# Patient Record
Sex: Female | Born: 1950
Health system: Southern US, Community
[De-identification: ages and names within clinical notes are randomized; demographics above are authoritative.]

## PROBLEM LIST (undated history)

## (undated) DIAGNOSIS — IMO0001 Reserved for inherently not codable concepts without codable children: Secondary | ICD-10-CM

## (undated) DIAGNOSIS — I899 Noninfective disorder of lymphatic vessels and lymph nodes, unspecified: Secondary | ICD-10-CM

## (undated) DIAGNOSIS — I639 Cerebral infarction, unspecified: Secondary | ICD-10-CM

## (undated) DIAGNOSIS — G629 Polyneuropathy, unspecified: Secondary | ICD-10-CM

## (undated) DIAGNOSIS — R569 Unspecified convulsions: Secondary | ICD-10-CM

## (undated) DIAGNOSIS — J449 Chronic obstructive pulmonary disease, unspecified: Secondary | ICD-10-CM

## (undated) DIAGNOSIS — F329 Major depressive disorder, single episode, unspecified: Secondary | ICD-10-CM

## (undated) DIAGNOSIS — R0601 Orthopnea: Secondary | ICD-10-CM

## (undated) DIAGNOSIS — K589 Irritable bowel syndrome without diarrhea: Secondary | ICD-10-CM

## (undated) DIAGNOSIS — H9190 Unspecified hearing loss, unspecified ear: Secondary | ICD-10-CM

## (undated) DIAGNOSIS — I509 Heart failure, unspecified: Secondary | ICD-10-CM

## (undated) DIAGNOSIS — G4730 Sleep apnea, unspecified: Secondary | ICD-10-CM

## (undated) DIAGNOSIS — M1990 Unspecified osteoarthritis, unspecified site: Secondary | ICD-10-CM

## (undated) DIAGNOSIS — K219 Gastro-esophageal reflux disease without esophagitis: Secondary | ICD-10-CM

## (undated) DIAGNOSIS — F32A Depression, unspecified: Secondary | ICD-10-CM

## (undated) DIAGNOSIS — M069 Rheumatoid arthritis, unspecified: Secondary | ICD-10-CM

## (undated) DIAGNOSIS — I10 Essential (primary) hypertension: Secondary | ICD-10-CM

## (undated) DIAGNOSIS — Z9981 Dependence on supplemental oxygen: Secondary | ICD-10-CM

## (undated) DIAGNOSIS — K7460 Unspecified cirrhosis of liver: Secondary | ICD-10-CM

## (undated) DIAGNOSIS — G2581 Restless legs syndrome: Secondary | ICD-10-CM

## (undated) DIAGNOSIS — R011 Cardiac murmur, unspecified: Secondary | ICD-10-CM

## (undated) DIAGNOSIS — J45909 Unspecified asthma, uncomplicated: Secondary | ICD-10-CM

## (undated) DIAGNOSIS — Z8679 Personal history of other diseases of the circulatory system: Secondary | ICD-10-CM

## (undated) DIAGNOSIS — K759 Inflammatory liver disease, unspecified: Secondary | ICD-10-CM

## (undated) DIAGNOSIS — E119 Type 2 diabetes mellitus without complications: Secondary | ICD-10-CM

## (undated) DIAGNOSIS — Z8619 Personal history of other infectious and parasitic diseases: Secondary | ICD-10-CM

## (undated) HISTORY — DX: Heart failure, unspecified: I50.9

## (undated) HISTORY — PX: THUMB ARTHROSCOPY: SHX2509

## (undated) HISTORY — PX: TONSILLECTOMY: SUR1361

## (undated) HISTORY — PX: CHOLECYSTECTOMY: SHX55

## (undated) HISTORY — PX: TYMPANOPLASTY: SHX33

## (undated) HISTORY — PX: ABDOMINAL HYSTERECTOMY: SHX81

## (undated) HISTORY — PX: KNEE ARTHROPLASTY: SHX992

---

## 1998-06-02 ENCOUNTER — Ambulatory Visit: Admission: RE | Admit: 1998-06-02 | Discharge: 1998-06-02 | Payer: Self-pay | Admitting: Internal Medicine

## 1998-06-03 ENCOUNTER — Ambulatory Visit: Admission: RE | Admit: 1998-06-03 | Discharge: 1998-06-03 | Payer: Self-pay | Admitting: Internal Medicine

## 2000-11-27 ENCOUNTER — Emergency Department (HOSPITAL_COMMUNITY): Admission: EM | Admit: 2000-11-27 | Discharge: 2000-11-27 | Payer: Self-pay

## 2004-11-15 ENCOUNTER — Emergency Department: Payer: Self-pay | Admitting: Emergency Medicine

## 2005-04-18 ENCOUNTER — Ambulatory Visit: Payer: Self-pay

## 2006-02-14 ENCOUNTER — Emergency Department: Payer: Self-pay | Admitting: Emergency Medicine

## 2007-05-29 ENCOUNTER — Emergency Department: Payer: Self-pay | Admitting: Emergency Medicine

## 2007-05-29 ENCOUNTER — Other Ambulatory Visit: Payer: Self-pay

## 2007-09-12 ENCOUNTER — Inpatient Hospital Stay: Payer: Self-pay | Admitting: Internal Medicine

## 2008-06-01 ENCOUNTER — Observation Stay: Payer: Self-pay | Admitting: Internal Medicine

## 2008-08-10 ENCOUNTER — Emergency Department: Payer: Self-pay | Admitting: Internal Medicine

## 2009-02-20 ENCOUNTER — Emergency Department: Payer: Self-pay | Admitting: Emergency Medicine

## 2009-07-12 ENCOUNTER — Ambulatory Visit: Payer: Self-pay | Admitting: Family Medicine

## 2009-12-18 ENCOUNTER — Ambulatory Visit: Payer: Self-pay | Admitting: Family Medicine

## 2010-04-16 ENCOUNTER — Ambulatory Visit: Payer: Self-pay | Admitting: Family Medicine

## 2010-04-27 ENCOUNTER — Inpatient Hospital Stay: Payer: Self-pay | Admitting: Internal Medicine

## 2010-06-13 ENCOUNTER — Emergency Department: Payer: Self-pay | Admitting: Emergency Medicine

## 2010-06-30 ENCOUNTER — Emergency Department: Payer: Self-pay | Admitting: Emergency Medicine

## 2011-02-23 ENCOUNTER — Inpatient Hospital Stay: Payer: Self-pay | Admitting: Internal Medicine

## 2011-11-14 ENCOUNTER — Emergency Department: Payer: Self-pay | Admitting: Emergency Medicine

## 2011-11-14 LAB — URINALYSIS, COMPLETE
Blood: NEGATIVE
Glucose,UR: NEGATIVE mg/dL (ref 0–75)
Leukocyte Esterase: NEGATIVE
Nitrite: NEGATIVE
Ph: 7 (ref 4.5–8.0)
Protein: NEGATIVE
RBC,UR: NONE SEEN /HPF (ref 0–5)
Specific Gravity: 1.006 (ref 1.003–1.030)
Squamous Epithelial: 3
WBC UR: NONE SEEN /HPF (ref 0–5)

## 2011-11-14 LAB — COMPREHENSIVE METABOLIC PANEL
Alkaline Phosphatase: 128 U/L (ref 50–136)
Anion Gap: 6 — ABNORMAL LOW (ref 7–16)
BUN: 7 mg/dL (ref 7–18)
Chloride: 104 mmol/L (ref 98–107)
Co2: 30 mmol/L (ref 21–32)
EGFR (African American): >60
Potassium: 3.5 mmol/L (ref 3.5–5.1)
SGOT(AST): 47 U/L — ABNORMAL HIGH (ref 15–37)
SGPT (ALT): 30 U/L
Total Protein: 6.7 g/dL (ref 6.4–8.2)

## 2011-11-14 LAB — CBC
HGB: 14.6 g/dL (ref 12.0–16.0)
MCH: 29.8 pg (ref 26.0–34.0)
MCHC: 34.2 g/dL (ref 32.0–36.0)
MCV: 87 fL (ref 80–100)
Platelet: 223 10*3/uL (ref 150–440)
RDW: 13.4 % (ref 11.5–14.5)

## 2012-05-15 ENCOUNTER — Inpatient Hospital Stay: Payer: Self-pay | Admitting: Specialist

## 2012-05-15 LAB — CBC WITH DIFFERENTIAL/PLATELET
Basophil #: 0.1 10*3/uL (ref 0.0–0.1)
Basophil %: 0.8 %
Eosinophil #: 0 10*3/uL (ref 0.0–0.7)
Eosinophil %: 0.6 %
HCT: 45.9 % (ref 35.0–47.0)
HGB: 15.5 g/dL (ref 12.0–16.0)
Lymphocyte #: 2.5 10*3/uL (ref 1.0–3.6)
Lymphocyte %: 33.9 %
MCH: 28.7 pg (ref 26.0–34.0)
MCHC: 33.7 g/dL (ref 32.0–36.0)
MCV: 85 fL (ref 80–100)
Monocyte #: 0.5 x10 3/mm (ref 0.2–0.9)
Monocyte %: 7 %
Neutrophil #: 4.3 10*3/uL (ref 1.4–6.5)
Neutrophil %: 57.7 %
Platelet: 242 10*3/uL (ref 150–440)
RBC: 5.4 10*6/uL — ABNORMAL HIGH (ref 3.80–5.20)
RDW: 13.3 % (ref 11.5–14.5)
WBC: 7.4 10*3/uL (ref 3.6–11.0)

## 2012-05-15 LAB — COMPREHENSIVE METABOLIC PANEL
Albumin: 3.5 g/dL (ref 3.4–5.0)
Alkaline Phosphatase: 141 U/L — ABNORMAL HIGH (ref 50–136)
Anion Gap: 7 (ref 7–16)
BUN: 5 mg/dL — ABNORMAL LOW (ref 7–18)
Bilirubin,Total: 0.5 mg/dL (ref 0.2–1.0)
Calcium, Total: 8.7 mg/dL (ref 8.5–10.1)
Chloride: 103 mmol/L (ref 98–107)
Co2: 27 mmol/L (ref 21–32)
Creatinine: 0.52 mg/dL — ABNORMAL LOW (ref 0.60–1.30)
EGFR (African American): >60
EGFR (Non-African Amer.): >60
Glucose: 149 mg/dL — ABNORMAL HIGH (ref 65–99)
Osmolality: 274 (ref 275–301)
Potassium: 3.3 mmol/L — ABNORMAL LOW (ref 3.5–5.1)
SGOT(AST): 50 U/L — ABNORMAL HIGH (ref 15–37)
SGPT (ALT): 37 U/L (ref 12–78)
Sodium: 137 mmol/L (ref 136–145)
Total Protein: 7.1 g/dL (ref 6.4–8.2)

## 2012-05-15 LAB — TROPONIN I: Troponin-I: <0.02 ng/mL

## 2012-05-16 ENCOUNTER — Ambulatory Visit: Payer: Self-pay | Admitting: Neurology

## 2012-05-16 LAB — BASIC METABOLIC PANEL
Anion Gap: 8 (ref 7–16)
BUN: 6 mg/dL — ABNORMAL LOW (ref 7–18)
Calcium, Total: 8.5 mg/dL (ref 8.5–10.1)
Chloride: 103 mmol/L (ref 98–107)
Co2: 27 mmol/L (ref 21–32)
Creatinine: 0.5 mg/dL — ABNORMAL LOW (ref 0.60–1.30)
EGFR (African American): >60
EGFR (Non-African Amer.): >60
Glucose: 171 mg/dL — ABNORMAL HIGH (ref 65–99)
Osmolality: 277 (ref 275–301)
Potassium: 3.7 mmol/L (ref 3.5–5.1)
Sodium: 138 mmol/L (ref 136–145)

## 2012-05-16 LAB — CBC WITH DIFFERENTIAL/PLATELET
Basophil #: 0 10*3/uL (ref 0.0–0.1)
Basophil %: 0.5 %
Eosinophil #: 0 10*3/uL (ref 0.0–0.7)
Eosinophil %: 0.5 %
HCT: 42.8 % (ref 35.0–47.0)
HGB: 14.6 g/dL (ref 12.0–16.0)
Lymphocyte #: 2 10*3/uL (ref 1.0–3.6)
Lymphocyte %: 24.4 %
MCH: 28.9 pg (ref 26.0–34.0)
MCHC: 34.1 g/dL (ref 32.0–36.0)
MCV: 85 fL (ref 80–100)
Monocyte #: 0.6 x10 3/mm (ref 0.2–0.9)
Monocyte %: 7.3 %
Neutrophil #: 5.6 10*3/uL (ref 1.4–6.5)
Neutrophil %: 67.3 %
Platelet: 204 10*3/uL (ref 150–440)
RBC: 5.04 10*6/uL (ref 3.80–5.20)
RDW: 13.4 % (ref 11.5–14.5)
WBC: 8.3 10*3/uL (ref 3.6–11.0)

## 2012-05-16 LAB — PHENYTOIN LEVEL, TOTAL: Dilantin: 12.6 ug/mL (ref 10.0–20.0)

## 2012-05-16 LAB — TROPONIN I
Troponin-I: <0.02 ng/mL
Troponin-I: <0.02 ng/mL

## 2012-05-16 LAB — CLOSTRIDIUM DIFFICILE BY PCR

## 2012-05-17 LAB — WBCS, STOOL

## 2012-05-18 LAB — STOOL CULTURE

## 2012-10-13 ENCOUNTER — Ambulatory Visit: Payer: Self-pay | Admitting: Family Medicine

## 2012-10-28 ENCOUNTER — Emergency Department: Payer: Self-pay | Admitting: Emergency Medicine

## 2012-10-28 LAB — COMPREHENSIVE METABOLIC PANEL
Albumin: 3.3 g/dL — ABNORMAL LOW (ref 3.4–5.0)
Alkaline Phosphatase: 150 U/L — ABNORMAL HIGH (ref 50–136)
BUN: 8 mg/dL (ref 7–18)
Bilirubin,Total: 0.4 mg/dL (ref 0.2–1.0)
Chloride: 98 mmol/L (ref 98–107)
EGFR (African American): >60
EGFR (Non-African Amer.): >60
Glucose: 117 mg/dL — ABNORMAL HIGH (ref 65–99)
Osmolality: 266 (ref 275–301)
Potassium: 3.2 mmol/L — ABNORMAL LOW (ref 3.5–5.1)
SGOT(AST): 47 U/L — ABNORMAL HIGH (ref 15–37)
Sodium: 133 mmol/L — ABNORMAL LOW (ref 136–145)

## 2012-10-28 LAB — CBC WITH DIFFERENTIAL/PLATELET
Basophil %: 1.4 %
Eosinophil #: 0.1 10*3/uL (ref 0.0–0.7)
Eosinophil %: 1 %
HCT: 44.8 % (ref 35.0–47.0)
Lymphocyte #: 2.2 10*3/uL (ref 1.0–3.6)
Lymphocyte %: 36.4 %
MCH: 29.6 pg (ref 26.0–34.0)
MCHC: 34.6 g/dL (ref 32.0–36.0)
Monocyte %: 8.4 %
Neutrophil %: 52.8 %
Platelet: 239 10*3/uL (ref 150–440)
RBC: 5.24 10*6/uL — ABNORMAL HIGH (ref 3.80–5.20)
RDW: 13.2 % (ref 11.5–14.5)
WBC: 6 10*3/uL (ref 3.6–11.0)

## 2013-04-18 ENCOUNTER — Emergency Department: Payer: Self-pay | Admitting: Emergency Medicine

## 2013-04-23 ENCOUNTER — Inpatient Hospital Stay: Payer: Self-pay | Admitting: Internal Medicine

## 2013-04-23 LAB — COMPREHENSIVE METABOLIC PANEL
Albumin: 3.2 g/dL — ABNORMAL LOW (ref 3.4–5.0)
Alkaline Phosphatase: 166 U/L — ABNORMAL HIGH (ref 50–136)
Anion Gap: 7 (ref 7–16)
BUN: 9 mg/dL (ref 7–18)
Bilirubin,Total: 0.4 mg/dL (ref 0.2–1.0)
Calcium, Total: 9.1 mg/dL (ref 8.5–10.1)
EGFR (African American): >60
EGFR (Non-African Amer.): >60
Glucose: 119 mg/dL — ABNORMAL HIGH (ref 65–99)
Osmolality: 270 (ref 275–301)
SGOT(AST): 35 U/L (ref 15–37)
Sodium: 135 mmol/L — ABNORMAL LOW (ref 136–145)
Total Protein: 7 g/dL (ref 6.4–8.2)

## 2013-04-23 LAB — CBC
HCT: 46.2 % (ref 35.0–47.0)
HGB: 16.1 g/dL — ABNORMAL HIGH (ref 12.0–16.0)
MCH: 29.6 pg (ref 26.0–34.0)
MCV: 85 fL (ref 80–100)
Platelet: 259 10*3/uL (ref 150–440)

## 2013-04-23 LAB — TROPONIN I
Troponin-I: <0.02 ng/mL
Troponin-I: <0.02 ng/mL

## 2013-04-23 LAB — CK TOTAL AND CKMB (NOT AT ARMC)
CK, Total: 39 U/L (ref 21–215)
CK, Total: 31 U/L (ref 21–215)
CK-MB: 0.9 ng/mL (ref 0.5–3.6)

## 2013-04-24 ENCOUNTER — Ambulatory Visit: Payer: Self-pay | Admitting: Neurology

## 2013-04-24 LAB — CBC WITH DIFFERENTIAL/PLATELET
Basophil %: 0.1 %
Eosinophil #: 0 10*3/uL (ref 0.0–0.7)
Eosinophil %: 0 %
HCT: 44.6 % (ref 35.0–47.0)
HGB: 15.3 g/dL (ref 12.0–16.0)
Lymphocyte %: 15.8 %
MCH: 29.6 pg (ref 26.0–34.0)
Neutrophil #: 7 10*3/uL — ABNORMAL HIGH (ref 1.4–6.5)
Platelet: 253 10*3/uL (ref 150–440)
RBC: 5.17 10*6/uL (ref 3.80–5.20)
RDW: 13 % (ref 11.5–14.5)
WBC: 8.6 10*3/uL (ref 3.6–11.0)

## 2013-04-24 LAB — COMPREHENSIVE METABOLIC PANEL
Albumin: 2.8 g/dL — ABNORMAL LOW (ref 3.4–5.0)
Anion Gap: 7 (ref 7–16)
Bilirubin,Total: 0.4 mg/dL (ref 0.2–1.0)
Chloride: 102 mmol/L (ref 98–107)
Co2: 26 mmol/L (ref 21–32)
EGFR (Non-African Amer.): >60
Total Protein: 6.2 g/dL — ABNORMAL LOW (ref 6.4–8.2)

## 2013-04-24 LAB — CK TOTAL AND CKMB (NOT AT ARMC): CK, Total: 24 U/L (ref 21–215)

## 2013-04-24 LAB — TROPONIN I: Troponin-I: <0.02 ng/mL

## 2013-04-24 LAB — CLOSTRIDIUM DIFFICILE BY PCR

## 2013-04-28 LAB — CULTURE, BLOOD (SINGLE)

## 2013-11-05 ENCOUNTER — Emergency Department: Payer: Self-pay | Admitting: Emergency Medicine

## 2013-11-05 LAB — COMPREHENSIVE METABOLIC PANEL
ALBUMIN: 3.4 g/dL (ref 3.4–5.0)
ALK PHOS: 145 U/L — AB
ALT: 35 U/L (ref 12–78)
Anion Gap: 7 (ref 7–16)
BILIRUBIN TOTAL: 0.3 mg/dL (ref 0.2–1.0)
BUN: 9 mg/dL (ref 7–18)
CALCIUM: 8.9 mg/dL (ref 8.5–10.1)
CHLORIDE: 103 mmol/L (ref 98–107)
CREATININE: 0.35 mg/dL — AB (ref 0.60–1.30)
Co2: 29 mmol/L (ref 21–32)
EGFR (African American): >60
Glucose: 219 mg/dL — ABNORMAL HIGH (ref 65–99)
Osmolality: 283 (ref 275–301)
POTASSIUM: 3.3 mmol/L — AB (ref 3.5–5.1)
SGOT(AST): 48 U/L — ABNORMAL HIGH (ref 15–37)
SODIUM: 139 mmol/L (ref 136–145)
Total Protein: 7 g/dL (ref 6.4–8.2)

## 2013-11-05 LAB — CBC
HCT: 44.8 % (ref 35.0–47.0)
HGB: 15.3 g/dL (ref 12.0–16.0)
MCH: 29.2 pg (ref 26.0–34.0)
MCHC: 34.2 g/dL (ref 32.0–36.0)
MCV: 85 fL (ref 80–100)
PLATELETS: 214 10*3/uL (ref 150–440)
RBC: 5.25 10*6/uL — ABNORMAL HIGH (ref 3.80–5.20)
RDW: 13.3 % (ref 11.5–14.5)
WBC: 6.5 10*3/uL (ref 3.6–11.0)

## 2013-11-05 LAB — TROPONIN I: Troponin-I: <0.02 ng/mL

## 2013-11-05 LAB — CK TOTAL AND CKMB (NOT AT ARMC)
CK, Total: 47 U/L
CK-MB: <0.5 ng/mL — ABNORMAL LOW (ref 0.5–3.6)

## 2013-11-05 LAB — D-DIMER(ARMC): D-Dimer: 589 ng/ml

## 2014-10-24 NOTE — Consult Note (Signed)
PATIENT NAME:  [name], [name] MR#:  [id number] DATE OF BIRTH:  Dec 23, 1950  DATE OF CONSULTATION:  05/16/2012  REFERRING PHYSICIAN:   CONSULTING PHYSICIAN:  Leotis Pain, MD  Information is obtained from the patient's family who is at bedside, the chart and patient herself with the assistance of a translator because the patient uses sign language.  HISTORY OF PRESENTING ILLNESS: This is a 64 year old female with past medical history of seizure disorder, has been on the same antiepileptics of Keppra 500 mg twice a day for a long period of time and has not followed up with Neurology for a prolonged period of time and she does not remember the last time she has seen one. The patient was walking around and was witnessed bystander that fell and description of generalized tonic-clonic seizure activity even though the duration was not known. It was suspected of her being hypoglycemic at that time but, as per family, they think her fingerstick was initially 137. When EMS arrived it was 153. En route to the hospital, she had another seizure activity and upon arrival to the Emergency Department she had three more convulsive seizures. At that time she was given Keppra load and increased Keppra to 1000 mg b.i.d. She is status post Dilantin load with 1000 mg and started on Dilantin 100 mg q.8. She also received a dose of Ativan. As per family at bedside and the patient herself, she is close her baseline, just feels a little bit tired. She tells me that she does not recall her prior seizure. With the current seizure she does not know whether there was any urinary incontinence, any tongue biting but there was apparent postictal state as per notes.  PAST MEDICAL HISTORY:  1. Chronic obstructive pulmonary disease. 2. Sleep apnea. She is on oxygen night.   3. Smoker.  4. Hypertension. 5. Hyperlipidemia. 6. Diabetes. 7. Diabetic neuropathy for which she is on Lyrica.  8. Deaf in both ears.   PAST SURGICAL  HISTORY:  1. Cholecystectomy. 2. Cesarean section. 3. Tonsillectomy.  ALLERGIES: Aspirin, Celebrex, Cipro, codeine, Levaquin, penicillin, and sulfa.   FAMILY HISTORY: Consistent with her mother who passed from complication of lung cancer.   SOCIAL HISTORY: She is a smoker of 1 pack per day. No alcohol use. No drug use. She gets assistance from home health nursing.   HOME MEDICATIONS:  1. Advair. 2. Albuterol. 3. Amlodipine. 4. Azithromycin. 5. Celexa. 6. Enalapril. 7. Iron supplementation. 8. Glipizide. 9. Hydrochlorothiazide.  10. Imdur. 11. Keppra 500 mg twice a day which was increased to 1000 mg twice a day here in the hospital.  12. Lyrica 150 mg twice a day for her neuropathy. 13. Omeprazole. 14. Plavix. 15. Spiriva. 16. Tylenol. 17. Ventolin.   PHYSICAL EXAMINATION:   VITAL SIGNS: Temperature 97.4, pulse 58, blood pressure 121/69, respirations 18, pulse oximetry 91% on 2 liters of O2.   PHYSICAL EXAMINATION: The patient is alert, awake, and oriented to time, place, location, date, time. She knows she is in the hospital. She knows which hospital. She is able to tell me with assistance of translation of the month and the date. Visual field testing is intact. Extraocular movements are intact. No facial asymmetry noted. Facial sensation intact bilaterally. Tongue is midline. She is able to shrug her shoulders bilateral without a problem. Motor examination no sign of spasticity or rigidity. Tone appears to be normal symmetrical. Upper extremity examination 4+/5. Bilateral lower extremities it is difficult to examine because she has fallen and has  stitches in the right knee and has severe pain in the left hip area. Dorsiflexion and plantar flexion is intact. Reflexes are 1+ throughout. Finger-to-nose coordination is intact. Ambulation could not be assessed.   LABORATORY, DIAGNOSTIC, AND RADIOLOGICAL DATA: Her lab work was evaluated with white blood cell count of 8.3, hemoglobin  14.6, hematocrit 42.8, platelets 204, sodium 138, potassium 3.7, BUN 6, creatinine 0.5, glucose 171. Liver function tests were also reviewed as patient was just started on Dilantin. ALT 37. There is slight increase in AST of 50. Dilantin level was reviewed and was 12.6 this morning.   IMPRESSION: This is a 64 year old female with multiple medical problems admitted with multiple convulsive seizures that resolved post Ativan, Keppra load, and Dilantin load. Current antiepileptic medication includes Keppra 1000 mg twice a day which was increased from her home medication 500 twice a day. She is started on Dilantin with a current dose of 100 q.8 and Dilantin level as above. She also takes Lyrica 150 mg twice a day, which is her home medication, which is for neuropathy.  PLAN:  1. Would obtain MRI of the brain as there is a possibility of mild dysmetria of finger-to-nose on the right that is very mild but at the same time in a patient that has 4 to 5 seizures that were suspected to be unprovoked seizures would want to make sure that we do not have any acute intracranial pathology that is contributing to this condition. 2. EEG as ordered per primary team.  3. Would obtain Dilantin level prior to discharge. 4. I strongly advised her to follow-up with a neurologist as outpatient since she has not seen one in a long time.  5. Physical therapy and occupational therapy.  It was a pleasure seeing this patient.   ____________________________ Leotis Pain, MD yz:drc D: 05/16/2012 12:25:35 ET T: 05/16/2012 12:42:19 ET JOB#: [id number]  cc: Leotis Pain, MD, <Dictator> Leotis Pain MD ELECTRONICALLY SIGNED 06/22/2012 19:13

## 2014-10-24 NOTE — H&P (Signed)
PATIENT NAME:  [name], [name] MR#:  [id number] DATE OF BIRTH:  1950/12/18  DATE OF ADMISSION:  05/15/2012  PRIMARY CARE PHYSICIAN: [facility], Salome Holmes, MD     CHIEF COMPLAINT: Brought in with seizure.  HISTORY OF PRESENT ILLNESS: This is a 64 year old female with a history of seizure disorder. As per the family, the sugar went down and they tried to give her some honey. She had a seizure.  EMS was called, and EMS fingerstick was 153. The patient had another seizure with EMS and another three seizures here in the Emergency Room with Dr. Cinda Quest, the ER physician, who gave IV Keppra, IV Cerebyx and two doses of Ativan. I tried getting a sign language interpreter, Jae Dire,  to translate; but the patient was in and out with sleepiness, was answering some questions appropriately, and then some questions not so appropriately and easily fell back asleep after each conversation, so not much history was obtained from the patient. History was obtained from the family and old chart.   PAST MEDICAL HISTORY:  1. Chronic obstructive pulmonary disease. 2. Sleep apnea on oxygen at night.  3. Ongoing tobacco abuse.  4. Hypertension.  5. Hyperlipidemia.  6. Diabetes.  7. Diabetic neuropathy.  8. Deaf in both ears.   PAST SURGICAL HISTORY:  1. Cholecystectomy.  2. C-section.  3. Numerous ear surgeries.  4. Tonsillectomy.   ALLERGIES: Aspirin, Celebrex, Cipro, codeine, Levaquin, penicillin, and sulfa.   FAMILY HISTORY: Mother died of complications of lung cancer. Father died of a cerebrovascular accident aneurysm of the brain.   SOCIAL HISTORY: She smokes 1 pack per day. No alcohol. No drug use. She does have a Home Health nurse that comes in and worked in the [employer] in the past. She lives with her husband.   CURRENT MEDICATIONS:  1. Advair Diskus 100/50, 2 puffs twice a day. 2. Albuterol nebulizer 3 mL every 6 hours as needed.  3. Amlodipine 10 mg daily.   4. Azithromycin 250 mg daily.  5. Celexa 20 mg daily.  6. Enalapril 20 mg at bedtime.  7. Ferrous sulfate 325 mg twice a day.  8. Glipizide 5 mg daily.  9. Hydrochlorothiazide 25 mg daily.  10. Imodium p.r.n.  11. Keppra 500 mg twice a day.  12. Lyrica 150 mg twice a day.  13. Omeprazole 20 mg daily.  14. Plavix 75 mg daily.  15. Spiriva 1 inhalation daily.  16. Tylenol p.r.n.  17. Ventolin HFA p.r.n.   REVIEW OF SYSTEMS: Difficult  to obtain secondary to the patient being in and out. CONSTITUTIONAL: No fever. EYES: No eye issues. EARS: Positive for being deaf bilaterally. CARDIOVASCULAR: Positive for chest hurts. RESPIRATORY: Positive for shortness breath. Positive for cough. GASTROINTESTINAL: Positive for nausea. Positive for vomiting. Positive for abdominal pain. Positive for diarrhea. Occasional blood. GENITOURINARY: No burning on urination. No hematuria. MUSCULOSKELETAL: Positive for leg pain, and that is all I got because she went out and sleeping after that. Unable to finish a complete review of systems secondary to altered mental status being ictal.   PHYSICAL EXAMINATION:  VITAL SIGNS: Pulse 59, respirations 22, blood pressure 133/62, and pulse oximetry 95% on 2 liters.   GENERAL: No respiratory distress.   HEENT: Eyes: Conjunctivae normal. Lids normal. Pupils equal, round, and reactive to light. Unable to test extraocular muscles. Ears, nose, mouth, and throat: Tympanic membranes no erythema. Nasal mucosa no erythema. Throat difficult to examine secondary to large tongue and patient's level  of consciousness. Lips and gums no lesions.   NECK: No JVD. No bruits. No lymphadenopathy. No thyromegaly. No thyroid nodules palpated.   LUNGS: Lungs are clear to auscultation. No use of accessory muscles to breathe. No rhonchi, rales, or wheeze heard.   CARDIOVASCULAR: S1, S2 normal. No gallops, rubs, or murmurs heard. Carotid upstroke 2+ bilaterally. No bruits.   EXTREMITIES: Dorsalis  pedis pulses 2+ bilaterally. No edema of the lower extremity.   ABDOMEN: Soft, nontender. No organomegaly/splenomegaly. Normoactive bowel sounds. No masses felt.   LYMPHATIC: No lymph nodes in the neck.   MUSCULOSKELETAL: No clubbing, edema, or cyanosis.   SKIN: No ulcers or lesions seen anteriorly.   NEUROLOGICAL: The patient did do some sign language moving both hands with the sign interpreter but then fell back asleep before my physical exam. Unable to test cranial nerves. Babinski negative bilaterally. Deep tendon reflexes 1/2+ plus bilaterally.   PSYCHIATRIC: Did answer some questions with the interpreter through sign language, sometimes answering appropriately, other times not making much sense, then fell asleep prior to me checking full orientation.   LABORATORY, DIAGNOSTIC AND RADIOLOGICAL DATA: Pelvis x-ray negative. Right femur negative. Left femur negative. CT scan of the cervical spine: No acute osseous injury. CT scan of the head: No acute intracranial process. White blood cell count 7.4, hemoglobin and hematocrit 15.5 and 45.9, platelet count 242.0. Glucose 149, BUN 5, creatinine 0.52, sodium 137, potassium 3.3, chloride 103, CO2 27, calcium 8.7. Liver function tests: AST slightly elevated at 50. EKG normal sinus rhythm, 71 beats per minute, QT 424, QTc 460.   ASSESSMENT AND PLAN:  1. Status epilepticus with a total of five seizures, now seems more postictal: The patient was given Ativan,  Cerebyx and Keppra. I will admit to the hospital, get an MRI of the brain. I will continue IV Cerebyx for right now, increase the dose of Keppra to 1000 mg b.i.d. We will get a Neurological evaluation in the a.m. Unfortunately, on the weekend unable to get an EEG.  2. Hypoglycemia, possibly as the precipitating event of this seizure:  I will hold the Glucotrol at this point. Check sugars every 2 hours, put on D5 NS at 40 mL/h.  3. Chronic obstructive pulmonary disease, sleep apnea, chronic  respiratory failure: Wears oxygen at night. The major issue is tobacco abuse. I will give a nicotine patch. Continue inhalers. Lungs are currently clear.  4. Hypertension: We will continue her usual medications.  5. Hypokalemia:  I will replace potassium in the IV and check a magnesium.  6. Depression: Continue Celexa.  7. Looking back through old records, it looks like she is chronically on this Zithromax, probably from the respiratory standpoint.  8. Diarrhea: I will send off stool studies.   CODE STATUS: The patient is a PARTIAL CODE, no intubation or mechanical ventilation, otherwise try cardiopulmonary resuscitation.   TIME SPENT ON ADMISSION: 60 minutes.  ____________________________ Tana Conch. Leslye Peer, MD [id number] D: 05/15/2012 16:27:43 ET T: 05/16/2012 07:09:11 ET JOB#: [id number]  cc: Tana Conch. Leslye Peer, MD, <Dictator> [facility], Salome Holmes, MD   Marisue Brooklyn MD ELECTRONICALLY SIGNED 06/07/2012 17:26

## 2014-10-24 NOTE — Discharge Summary (Signed)
PATIENT NAME:  [name], FRYMIRE MR#:  [id number] DATE OF BIRTH:  Mar 14, 1951  DATE OF ADMISSION:  05/15/2012 DATE OF DISCHARGE:  05/18/2012  For a detailed note, please take a look at the history and physical done on admission by Dr. Loletha Grayer.   DIAGNOSES AT DISCHARGE:  1. Recurrent seizures, now resolved.  2. Diabetes.  3. Diabetic neuropathy.  4. Hypertension.  5. Gastroesophageal reflux disease.  6. Chronic obstructive pulmonary disease.   DIET: The patient is being discharged on a low sodium, low cholesterol, carb-controlled diet.   ACTIVITY: As tolerated. The patient is not to drive or operate any heavy machinery for six months.   FOLLOW-UP: Follow-up with Dr. Iona Beard in the next 1 to 2 weeks.   DISCHARGE MEDICATIONS:   1. Advair 100/50 two puffs b.i.d.  2. Amlodipine 10 mg daily.  3. Celexa 20 mg daily.  4. Enalapril 20 mg daily.  5. Iron sulfate 325 mg b.i.d.  6. Glipizide 5 mg daily.  7. HCTZ 25 mg daily.  8. Plavix 75 mg daily.   9. Spiriva 1 puff daily.  10. Omeprazole 20 mg daily.  11. Imodium as needed.  12. Lyrica 150 mg b.i.d.  13. Albuterol nebulizer q.6 hours as needed.  14. Zithromax 250 mg daily.  15. Tylenol 325 mg q.6 hours as needed.  16. Keppra 1000 mg b.i.d.   CONSULTANTS DURING THE HOSPITAL COURSE: Dr. Valora Corporal and Dr. Irish Elders from Neurology    [place]:  1. CT scan of the head done on admission without contrast showing no acute intracranial process.  2. CT of the cervical spine also done without contrast showing no osseous injury of the cervical spine.  3. MRI of the brain done with and without contrast showing no acute abnormality.  4. X-ray of the left ankle showing no acute osseous abnormality.   HOSPITAL COURSE: This is a 64 year old female with medical problems as mentioned above who presented to the hospital with recurrent seizures and status epilepticus.  1. Recurrent seizures. The  patient has a history of epilepsy, was already on Keppra prior to coming in but presented with 4 to 5 seizures within a matter of a few hours. She was significantly postictal when she presented to the hospital although was able to maintain her airway. She was loaded with IV Dilantin, started on IV fosphenytoin, and her Keppra dose was increased from 500 b.i.d. to 1000 b.i.d. She underwent significant testing including a CT of her head and MRI of her brain which were negative. She was also seen by Neurology with Dr. Valora Corporal, also underwent an EEG the results of which are still pending but, as per Neurology, she has had no further seizures during the hospital course. Since her imaging studies are negative, she is currently being discharged on a higher dose of Keppra than what she was on. She was on fosphenytoin as mentioned but she has not tolerated Dilantin well, therefore, that was discontinued. She will follow-up with her neurologist as an outpatient in the next 3 to 6 months.  2. Diabetes. The patient had no evidence of hypoglycemia or hyperglycemia. She will resume her glipizide upon discharge.  3. Diabetic neuropathy. The patient was maintained on her Lyrica. She will resume that upon discharge.  4. COPD. She had no acute exacerbation. She will continue her Advair, Spiriva, and p.r.n. nebulizer treatments.  5. Hypertension. The patient remained hemodynamically stable. She will continue her Norvasc, enalapril, and HCTZ  upon discharge.  6. Right knee laceration. She developed this after a fall after having recurrent seizures. It was sutured in the ER. She currently has a dressing on it. She will continue her dressing changes at home. Likely should have her sutures removed in the next week or so after she follows up with her regular doctor. She will continue to apply Neosporin locally twice daily.   The patient was evaluated by physical therapy who thought she would benefit from home health physical  therapy which was arranged for her prior to discharge.   CODE STATUS: The patient is a LIMITED CODE.   TIME SPENT WITH THE DISCHARGE: 40 minutes.   ____________________________ Belia Heman. Verdell Carmine, MD vjs:drc D: 05/18/2012 16:17:55 ET T: 05/19/2012 11:28:13 ET JOB#: [id number]  cc: Belia Heman. Verdell Carmine, MD, <Dictator> Salome Holmes, MD Henreitta Leber MD ELECTRONICALLY SIGNED 05/20/2012 8:11

## 2014-10-27 NOTE — Discharge Summary (Signed)
PATIENT NAME:  [name], [name] MR#:  [id number] DATE OF BIRTH:  01-06-1951  DATE OF ADMISSION:  04/23/2013 DATE OF DISCHARGE:  04/26/2013   DISCHARGE DIAGNOSES: 1.  Pneumonia, respiratory failure, improved. 2.  Seizure disorder.  3.  Electrolyte imbalance.  4.  Deafness.   CONDITION ON DISCHARGE: Stable.   CODE STATUS: Full code.   MEDICATIONS ON DISCHARGE: 1.  Amlodipine 10 mg once a day.  2.  Citalopram 20 mg once a day.  3.  Ferrous sulfate 325 mg 2 times a day. 4.  Omeprazole 20 mg once a day.  5.  Lyrica 150 mg 2 times a day.  6.  Albuterol inhaler every 6 hours as needed for shortness of breath. 7.  Advair 1 puff 2 times a day.  8.  Enalapril 20 mg once a day.  9.  Glipizide XL 5 mg 2 times a day.  10.   Hydrochlorothiazide 25 mg to take 1/2 tablet once a day.  11.   Clopidogrel 75 mg once a day.  12.   Prednisone 10 mg, start at 60 mg and taper by 10 mg until complete.  13.   Keppra 1000 mg oral tablet every 12 hours.  14.   Ceftin 500 mg oral 2 times a day for 3 days.  15.  Erythromycin 250 mg tablet once a day for 3 days   HOME HEALTH ON DISCHARGE: Yes.   HOME HEALTH SERVICES: Nurse aide.   DIET ON DISCHARGE: Low-sodium.   FOLLOWUP:  Timeframe to follow-up in 1 to 2 weeks required with PMD.   HISTORY OF PRESENT ILLNESS:   Admitted October 18 by Dr. Felipa Furnace;  a 64 year old female with known seizure disorder, a history of a previous stroke, rheumatic fever, pneumonia, sleep apnea and COPD, presented to the Emergency Room with left-sided chest pain radiating to the left arm associated with shortness of breath. In the Emergency Room, the patient's cardiac enzymes were negative, but CT of the chest revealed left lower lobe pneumonia.   HOSPITAL COURSE AND STAY:  1.  This patient came with chest pain and shortness of breath and found having pneumonia. The patient also had some seizure episodes in the hospital. We called neurology consult and seizure episodes  thought due to infection and advised to continue [place]. The patient remained afebrile and no seizures for next 48 hours.  2.  Left lower lobe pneumonia. She was on Rocephin and Zithromax and because of that, patient had acute on chronic respiratory failure, using oxygen. The patient also had chest pain on presentation, which was noncardiac. Troponins were negative and we attributed the pain to her pneumonia.  3.  Chronic obstructive pulmonary disease, acute respiratory failure. She was on IV steroids and inhaled nebulizer treatment and will continue on discharge.  4.  Electrolyte imbalance, which was replaced and corrected.  5.  Bilateral hearing loss; this is chronic complaint.   CONSULTATION IN THE HOSPITAL:  Dr. Valora Corporal from neurology.   IMPORTANT LABORATORIES IN THE HOSPITAL:  Chest x-ray, PA and lateral shows no acute cardiopulmonary disease per admission.  no intracranial abnormality. CT angiography of chest for pulmonary embolism: Left lower lobe pneumonia versus postobstructive atelectasis. Correlation with clinical lab data. Blood cultures: No growth. Keppra level was reported after patient was discharged and which was 26.7, totally normal.    Total time spent on this discharge:  35 minutes    ____________________________ Ceasar Lund. Anselm Jungling, MD vgv:[id number] D: 04/30/2013 23:36:00 ET T: 05/01/2013 01:37:27  ET JOB#: [id number]  cc: [facility] Ceasar Lund. Anselm Jungling, MD, <Dictator>    Vaughan Basta MD ELECTRONICALLY SIGNED 05/04/2013 13:59

## 2014-10-27 NOTE — Consult Note (Signed)
PATIENT NAME:  [name], SOCKWELL MR#:  [id number] DATE OF BIRTH:  May 24, 1951  DATE OF CONSULTATION:  04/24/2013  CONSULTING PHYSICIAN:  Rogue Jury, MD  ADDENDUM  I just spoke to the nurse, who saw 2 events early this morning, which were typical of what had happened in the Emergency Room. She says that the patient's eyes were closed and her head was going back and forth in a no-no fashion. Her feet were having some flexion-extension at the ankles bilaterally, with no movements of the arms and no stiffening. About 30 to 45 seconds later, she opened her eyes. She was fully with it and awake and alert and non-postictal. She wanted to have coffee and breakfast immediately after. There was no tongue biting, no urinary incontinence and no other focal changes. These events are suggestive of nonepileptic psychogenic events and not true epileptic events. She may have had real seizures in the distant past, but these particular events are not likely to be real seizures.    ____________________________ Rogue Jury, MD se:jm D: 04/24/2013 12:33:54 ET T: 04/24/2013 15:39:35 ET JOB#: [id number]  cc: Rogue Jury, MD, <Dictator> Rogue Jury MD ELECTRONICALLY SIGNED 05/25/2013 9:50

## 2014-10-27 NOTE — Consult Note (Signed)
PATIENT NAME:  [name], [name] MR#:  [id number] DATE OF BIRTH:  1951/01/18  DATE OF CONSULTATION:  04/24/2013  REFERRING PHYSICIAN:  Internal Medicine Hospitalist CONSULTING PHYSICIAN:  Rogue Jury, MD  REASON FOR CONSULTATION:  Seizure.  HISTORY OF PRESENT ILLNESS:  The patient is a 64 year old white female who was admitted on April 23, 2013, with shortness of breath and left-sided chest discomfort. CTA of the chest showed left lower lobe pneumonia. During this evaluation in the Emergency Room, the patient had 7 events thought to be seizures. They are described by the daughter as her head moving back and forth left to right, with her eyes closed but having nystagmus underneath it. She was nonresponsive and then her legs had stiffening and clonic movements. Each one lasted about 30 seconds to a minute and there was a total of 7 of them. She was loaded on Keppra and put on higher dose of Keppra maintenance therapy as compared to what she was taking at home. She denies any noncompliance with Keppra at home. Her seizure history dates back 30 years without a clear cause. She initially was on Dilantin and eventually switched to [place] as an outpatient. She does have bilateral hearing loss due to repeated ear infections as a child in the [facility]. Family history is not known, although there may have been a first cousin with seizures. The patient says that she has an aura of a sweet smell before the seizures start, and sometimes she gets the sweet smell without an actual seizure following. Her daughter has witnessed staring and nonresponsive episodes after these sweet smelling events as well. CT scan of the brain was obtained, which I reviewed. It indicates no acute hemorrhages. There is no evidence of an old infarct present. Chest x-ray showed the left lower lobe pneumonia, as well as CTA of the chest. CBC is normal. Blood cultures are negative. Cardiac enzymes are negative. Liver enzymes are normal except  for alkaline phosphatase mildly elevated at 149. Albumin is 2.8. Basic metabolic panel is normal except for a glucose of 163.  PAST MEDICAL HISTORY:   1.  Seizures. 2.  Hypotension. 3.  Depression. 4.  Anemia.  5.  Diabetes. 6.  COPD. 7.  Rheumatic fever. 8.  Bilateral hearing loss due to repeated ear infections as a child.  PAST SURGICAL HISTORY:  Negative.  CURRENT MEDICATIONS IN THE HOSPITAL: 1.  Norvasc 10 mg daily. 2.  Celexa 20 mg daily. 3.  Enalapril 20 mg daily. 4.  Iron sulfate 325 mg daily. 5.  Hydrochlorothiazide 12.5 mg daily. 6.  Keppra 1000 mg b.i.d.  7.  Lyrica 150 mg b.i.d.  8.  Plavix 75 mg daily. 9.  Lovenox 40 mg subcutaneously daily. 10.  Glipizide 5 mg b.i.d.  11.  Insulin sliding scale. 12.  Azithromycin 500 mg IV daily. 13.  Ceftriaxone 1 gram IV daily. 14.  Solu-Medrol 60 mg IV q.12 hours.  ALLERGIES:  1.  SULFA. 2.  ASPIRIN. 3.  PENICILLIN. 4.  DILANTIN. 5.  LEVAQUIN. 6.  CELEBREX. 7.  CIPRO. 8.  CODEINE.  SOCIAL HISTORY:  One pack per day smoker. No alcohol or illicit drug use.  FAMILY HISTORY:  Positive for diabetes, stroke and coronary artery disease.  REVIEW OF SYSTEMS:  Reveals no fever, no meningismus, no diplopia, no dysphagia. No chest pain or shortness of breath at this point, although she did have some on admission. All other review of systems are negative.  PHYSICAL EXAMINATION:  VITAL SIGNS:  Blood  pressure is 132/54, pulse of 59, temperature 97.9. HEART:  Regular rate and rhythm, S1, S2. No murmurs. LUNGS: Clear to auscultation.  NECK:  There are no carotid bruits. NEUROLOGIC:  She is awake and alert. Hearing impaired. Uses sign language to speak and does read lips. Pupils are equal and reactive. Extraocular movements are intact. Face is symmetrical. Tongue is midline. There are no lacerations on the tongue. Strength is 5 out of 5 bilaterally in upper and lower extremities in all muscle groups. Reflexes are +2 and symmetrical.  Sensation is intact to all modalities. Coordination is fully intact. There is no Babinski sign. There is no Hoffmann sign. There are no skin rashes. Gait is normal.  IMPRESSION AND PLAN:  This is a patient with a long history of seizure disorder, likely to be genetic in origin. She has had a recent recurrence in the hospital, possibly due to seizure threshold lowering effects of an infection. Some of the features of the seizures based on the daughter's description sound psychogenic, nonepileptic in nature, however. Nevertheless, I think it is reasonable to have increased her Keppra maintenance dose. I do recommend doing an EEG to look for signs of seizure tendency and to ensure that her electrical activity is normal. If there is any focality to the EEG, then I recommend doing an MRI of the brain. Otherwise, I do not think it would be necessary since her neurological exam is very good.    ____________________________ Rogue Jury, MD se:jm D: 04/24/2013 12:23:56 ET T: 04/24/2013 15:14:06 ET JOB#: [id number]  cc: Rogue Jury, MD, <Dictator> Rogue Jury MD ELECTRONICALLY SIGNED 05/25/2013 9:50

## 2014-10-27 NOTE — H&P (Signed)
PATIENT NAME:  [name], [name] MR#:  [id number] DATE OF BIRTH:  03-30-1951  DATE OF ADMISSION:  04/23/2013  REFERRING PHYSICIAN: Dr. Cinda Quest  FAMILY PHYSICIAN: Dr. Salome Holmes    REASON FOR ADMISSION: Pleuritic chest pain with recurrent seizures.   HISTORY OF PRESENT ILLNESS: The patient is a 64 year old female with known seizure disorder. Has a history of a previous stroke, rheumatic fever, pneumonia, sleep apnea, and COPD /asthma. Presented to the Emergency Room with left-sided chest pain radiating to the left arm associated with shortness of breath. In the Emergency Room, the patient's cardiac enzymes were negative, but CT of the chest revealed a left lower lobe pneumonia. While in the CT scanner, the patient had a recurrent seizure. Had another one in the Emergency Room. She is now admitted for further evaluation. The patient is deaf. She is currently sedated from the Ativan and is unable to give a history.   PAST MEDICAL HISTORY: 1.  History of rheumatic fever.  2.  Irritable bowel syndrome.  3.  Seizure disorder.  4.  History of pneumonia.  5.  Obstructive sleep apnea.  6.  Previous stroke.  7.  Chronic low back pain.  8.  Restless legs syndrome.  9.  Nonalcoholic cirrhosis.  10.  Type 2 diabetes mellitus.  11.  COPD/asthma. 12.  Benign hypertension.  13.  Bilateral hearing loss.   MEDICATIONS:  1.  Prilosec 20 mg p.o. daily.  2.  Lyrica 150 mg p.o. b.i.d.  3.  Keppra 500 mg p.o. b.i.d.  4.  Hydrochlorothiazide 12.5 mg p.o. daily.  5.  Glucotrol XL 5 mg p.o. b.i.d.  6.  Iron sulfate 325 mg p.o. b.i.d.  7.  Vasotec 20 mg p.o. daily.  8.  Plavix 75 mg p.o. daily. 9.  Celexa 20 mg p.o. daily.  10.  Norvasc 10 mg p.o. daily.  11.  Advair 100 plus 50, 1 puff b.i.d.  12.  DuoNeb SVN q. 6 hours p.r.n. shortness of breath.   ALLERGIES: ASPIRIN, SULFA, PENICILLIN, DILANTIN, LEVAQUIN, CELEBREX, CIPRO, CODEINE.   SOCIAL HISTORY: The patient continues to smoke 1 pack per day. No  apparent history of alcohol abuse.   FAMILY HISTORY: Positive for diabetes, stroke, and coronary artery disease.   REVIEW OF SYSTEMS:  Unable to obtain from the patient.   PHYSICAL EXAMINATION: GENERAL: The patient is lethargic/sedated, but in no acute distress.  VITAL SIGNS:  Currently remarkable for a blood pressure of 124/53, with a heart rate of 68, and a respiratory rate of 20. She is afebrile.  HEENT: Normocephalic, atraumatic. Pupils equally round and reactive to light and accommodation. Extraocular movements are intact. Sclerae are nonicteric. Conjunctivae are clear. Oropharynx is clear.  NECK: Supple, without JVD. No adenopathy or thyromegaly is noted.  LUNGS: Reveal diffuse rhonchi, left greater than right, with scattered wheezes. No rales. No dullness. Respiratory effort is normal.  CARDIAC: Regular rate and rhythm, with normal S1, S2. No significant rubs or gallops. There is a 2/6 systolic murmur noted throughout the precordium. PMI is nondisplaced. Chest wall is nontender.  ABDOMEN: Soft, nontender, with normoactive bowel sounds. No organomegaly or masses were appreciated. No hernias or bruits were noted.  EXTREMITIES: Without clubbing, cyanosis, edema. Pulses were 2+ bilaterally.  SKIN: Warm and dry, without rash or lesions.  NEUROLOGIC: Exam revealed cranial nerves II through XII grossly intact. Deep tendon reflexes were symmetric. Motor and sensory exams nonfocal.  PSYCHIATRIC: Exam revealed a patient who is sedated and unable to answer questions  appropriately.   LABORATORY DATA:  EKG revealed sinus rhythm at 72 beats per minute, with no acute ischemic changes. Head CT was unremarkable. CT scan of the chest revealed left lower lobe pneumonia. White count was 10.9, with a hemoglobin of 16.1. Glucose 119, with a BUN of 9, a creatinine of 0.61, with a GFR of greater than 60. Sodium was 135, with a potassium of 3.4. Troponin was less than 0.02.   ASSESSMENT: 1.  Left lower lobe  pneumonia with pleurisy.  2.  Seizure disorder.  3.  Noncardiac chest pain.  4.  Chronic obstructive pulmonary disease/asthma.  5.  Shortness of breath with chronic obstructive pulmonary disease exacerbation.  6.  Hyponatremia.  7.  Hypokalemia.  8.  Bilateral hearing loss.   PLAN: The patient is being admitted to the floor with telemetry and started on IV fluids, IV antibiotics, with DuoNeb SVN. Will continue her Advair and add Mucinex. Will increase her dose of Keppra at this time and use IV Ativan as needed for recurrent seizures. Will obtain an EEG and a Neurology consult. Will supplement oxygen at this time and wean as tolerated. Will follow her sugars with Accu-Cheks before meals and at bedtime, and add sliding scale insulin as needed. Follow up labs and chest x-ray in the morning. Further treatment and evaluation will depend upon the patient's progress.   Total time spent on this patient was 50 minutes.       ____________________________ Leonie Douglas Doy Hutching, MD jds:mr D: 04/23/2013 18:35:27 ET T: 04/23/2013 19:29:27 ET JOB#: [id number]  cc: Leonie Douglas. Doy Hutching, MD, <Dictator> Salome Holmes, MD  Bonni Neuser Lennice Sites MD ELECTRONICALLY SIGNED 04/25/2013 8:12

## 2014-11-17 ENCOUNTER — Ambulatory Visit
Admission: RE | Admit: 2014-11-17 | Discharge: 2014-11-17 | Disposition: A | Discharge status: Home / Self Care | Payer: Medicaid Other | Source: Ambulatory Visit | Attending: Ophthalmology | Admitting: Ophthalmology

## 2014-11-17 ENCOUNTER — Other Ambulatory Visit: Payer: Self-pay

## 2014-11-17 DIAGNOSIS — E119 Type 2 diabetes mellitus without complications: Secondary | ICD-10-CM | POA: Diagnosis not present

## 2014-11-17 DIAGNOSIS — J45909 Unspecified asthma, uncomplicated: Secondary | ICD-10-CM | POA: Diagnosis not present

## 2014-11-17 DIAGNOSIS — K7460 Unspecified cirrhosis of liver: Secondary | ICD-10-CM | POA: Diagnosis not present

## 2014-11-17 DIAGNOSIS — H9190 Unspecified hearing loss, unspecified ear: Secondary | ICD-10-CM | POA: Diagnosis not present

## 2014-11-17 DIAGNOSIS — G4730 Sleep apnea, unspecified: Secondary | ICD-10-CM | POA: Diagnosis not present

## 2014-11-17 DIAGNOSIS — Z886 Allergy status to analgesic agent status: Secondary | ICD-10-CM | POA: Diagnosis not present

## 2014-11-17 DIAGNOSIS — Z882 Allergy status to sulfonamides status: Secondary | ICD-10-CM | POA: Diagnosis not present

## 2014-11-17 DIAGNOSIS — Z79899 Other long term (current) drug therapy: Secondary | ICD-10-CM | POA: Diagnosis not present

## 2014-11-17 DIAGNOSIS — K219 Gastro-esophageal reflux disease without esophagitis: Secondary | ICD-10-CM | POA: Diagnosis not present

## 2014-11-17 DIAGNOSIS — Z880 Allergy status to penicillin: Secondary | ICD-10-CM | POA: Diagnosis not present

## 2014-11-17 DIAGNOSIS — Z01812 Encounter for preprocedural laboratory examination: Secondary | ICD-10-CM | POA: Insufficient documentation

## 2014-11-17 DIAGNOSIS — H2513 Age-related nuclear cataract, bilateral: Secondary | ICD-10-CM | POA: Diagnosis not present

## 2014-11-17 DIAGNOSIS — R011 Cardiac murmur, unspecified: Secondary | ICD-10-CM | POA: Diagnosis not present

## 2014-11-17 DIAGNOSIS — H269 Unspecified cataract: Secondary | ICD-10-CM | POA: Diagnosis present

## 2014-11-17 DIAGNOSIS — F329 Major depressive disorder, single episode, unspecified: Secondary | ICD-10-CM | POA: Diagnosis not present

## 2014-11-17 DIAGNOSIS — J449 Chronic obstructive pulmonary disease, unspecified: Secondary | ICD-10-CM | POA: Diagnosis not present

## 2014-11-17 DIAGNOSIS — Z8673 Personal history of transient ischemic attack (TIA), and cerebral infarction without residual deficits: Secondary | ICD-10-CM | POA: Diagnosis not present

## 2014-11-17 DIAGNOSIS — Z87891 Personal history of nicotine dependence: Secondary | ICD-10-CM | POA: Diagnosis not present

## 2014-11-17 DIAGNOSIS — Z881 Allergy status to other antibiotic agents status: Secondary | ICD-10-CM | POA: Diagnosis not present

## 2014-11-17 DIAGNOSIS — G2581 Restless legs syndrome: Secondary | ICD-10-CM | POA: Diagnosis not present

## 2014-11-17 DIAGNOSIS — Z885 Allergy status to narcotic agent status: Secondary | ICD-10-CM | POA: Diagnosis not present

## 2014-11-17 DIAGNOSIS — I10 Essential (primary) hypertension: Secondary | ICD-10-CM | POA: Insufficient documentation

## 2014-11-17 LAB — POTASSIUM: POTASSIUM: 3.2 mmol/L — AB (ref 3.5–5.1)

## 2014-11-21 ENCOUNTER — Encounter: Payer: Self-pay | Admitting: *Deleted

## 2014-11-21 DIAGNOSIS — H269 Unspecified cataract: Secondary | ICD-10-CM | POA: Diagnosis not present

## 2014-11-21 NOTE — OR Nursing (Signed)
Potassium 3.2 called to eye center and repeat am surgery ordered

## 2014-11-23 ENCOUNTER — Ambulatory Visit: Payer: Medicaid Other | Admitting: Anesthesiology

## 2014-11-23 ENCOUNTER — Encounter: Payer: Self-pay | Admitting: *Deleted

## 2014-11-23 ENCOUNTER — Ambulatory Visit
Admission: RE | Admit: 2014-11-23 | Discharge: 2014-11-23 | Disposition: A | Discharge status: Home / Self Care | Payer: Medicaid Other | Source: Ambulatory Visit | Attending: Ophthalmology | Admitting: Ophthalmology

## 2014-11-23 ENCOUNTER — Encounter
Admission: RE | Disposition: A | Discharge status: Home / Self Care | Payer: Self-pay | Source: Ambulatory Visit | Attending: Ophthalmology

## 2014-11-23 DIAGNOSIS — Z8673 Personal history of transient ischemic attack (TIA), and cerebral infarction without residual deficits: Secondary | ICD-10-CM | POA: Insufficient documentation

## 2014-11-23 DIAGNOSIS — Z882 Allergy status to sulfonamides status: Secondary | ICD-10-CM | POA: Insufficient documentation

## 2014-11-23 DIAGNOSIS — H269 Unspecified cataract: Secondary | ICD-10-CM | POA: Diagnosis not present

## 2014-11-23 DIAGNOSIS — F329 Major depressive disorder, single episode, unspecified: Secondary | ICD-10-CM | POA: Insufficient documentation

## 2014-11-23 DIAGNOSIS — G4730 Sleep apnea, unspecified: Secondary | ICD-10-CM | POA: Insufficient documentation

## 2014-11-23 DIAGNOSIS — Z87891 Personal history of nicotine dependence: Secondary | ICD-10-CM | POA: Insufficient documentation

## 2014-11-23 DIAGNOSIS — R011 Cardiac murmur, unspecified: Secondary | ICD-10-CM | POA: Insufficient documentation

## 2014-11-23 DIAGNOSIS — I10 Essential (primary) hypertension: Secondary | ICD-10-CM | POA: Insufficient documentation

## 2014-11-23 DIAGNOSIS — G2581 Restless legs syndrome: Secondary | ICD-10-CM | POA: Insufficient documentation

## 2014-11-23 DIAGNOSIS — J45909 Unspecified asthma, uncomplicated: Secondary | ICD-10-CM | POA: Insufficient documentation

## 2014-11-23 DIAGNOSIS — J449 Chronic obstructive pulmonary disease, unspecified: Secondary | ICD-10-CM | POA: Insufficient documentation

## 2014-11-23 DIAGNOSIS — E119 Type 2 diabetes mellitus without complications: Secondary | ICD-10-CM | POA: Insufficient documentation

## 2014-11-23 DIAGNOSIS — Z881 Allergy status to other antibiotic agents status: Secondary | ICD-10-CM | POA: Insufficient documentation

## 2014-11-23 DIAGNOSIS — K7460 Unspecified cirrhosis of liver: Secondary | ICD-10-CM | POA: Insufficient documentation

## 2014-11-23 DIAGNOSIS — Z885 Allergy status to narcotic agent status: Secondary | ICD-10-CM | POA: Insufficient documentation

## 2014-11-23 DIAGNOSIS — H9190 Unspecified hearing loss, unspecified ear: Secondary | ICD-10-CM | POA: Insufficient documentation

## 2014-11-23 DIAGNOSIS — Z886 Allergy status to analgesic agent status: Secondary | ICD-10-CM | POA: Insufficient documentation

## 2014-11-23 DIAGNOSIS — Z79899 Other long term (current) drug therapy: Secondary | ICD-10-CM | POA: Insufficient documentation

## 2014-11-23 DIAGNOSIS — K219 Gastro-esophageal reflux disease without esophagitis: Secondary | ICD-10-CM | POA: Insufficient documentation

## 2014-11-23 DIAGNOSIS — Z880 Allergy status to penicillin: Secondary | ICD-10-CM | POA: Insufficient documentation

## 2014-11-23 HISTORY — DX: Unspecified convulsions: R56.9

## 2014-11-23 HISTORY — DX: Polyneuropathy, unspecified: G62.9

## 2014-11-23 HISTORY — DX: Reserved for inherently not codable concepts without codable children: IMO0001

## 2014-11-23 HISTORY — DX: Noninfective disorder of lymphatic vessels and lymph nodes, unspecified: I89.9

## 2014-11-23 HISTORY — DX: Unspecified asthma, uncomplicated: J45.909

## 2014-11-23 HISTORY — DX: Unspecified cirrhosis of liver: K74.60

## 2014-11-23 HISTORY — DX: Chronic obstructive pulmonary disease, unspecified: J44.9

## 2014-11-23 HISTORY — DX: Cerebral infarction, unspecified: I63.9

## 2014-11-23 HISTORY — DX: Inflammatory liver disease, unspecified: K75.9

## 2014-11-23 HISTORY — DX: Dependence on supplemental oxygen: Z99.81

## 2014-11-23 HISTORY — DX: Sleep apnea, unspecified: G47.30

## 2014-11-23 HISTORY — DX: Unspecified hearing loss, unspecified ear: H91.90

## 2014-11-23 HISTORY — PX: CATARACT EXTRACTION W/PHACO: SHX586

## 2014-11-23 HISTORY — DX: Orthopnea: R06.01

## 2014-11-23 HISTORY — DX: Cardiac murmur, unspecified: R01.1

## 2014-11-23 HISTORY — DX: Essential (primary) hypertension: I10

## 2014-11-23 HISTORY — DX: Gastro-esophageal reflux disease without esophagitis: K21.9

## 2014-11-23 HISTORY — DX: Major depressive disorder, single episode, unspecified: F32.9

## 2014-11-23 HISTORY — DX: Depression, unspecified: F32.A

## 2014-11-23 HISTORY — DX: Restless legs syndrome: G25.81

## 2014-11-23 HISTORY — DX: Type 2 diabetes mellitus without complications: E11.9

## 2014-11-23 LAB — GLUCOSE, CAPILLARY: Glucose-Capillary: 178 mg/dL — ABNORMAL HIGH (ref 65–99)

## 2014-11-23 SURGERY — PHACOEMULSIFICATION, CATARACT, WITH IOL INSERTION
Anesthesia: Monitor Anesthesia Care | Laterality: Right

## 2014-11-23 MED ORDER — POLYMYXIN B-TRIMETHOPRIM 10000-0.1 UNIT/ML-% OP SOLN
1.0000 [drp] | OPHTHALMIC | Status: AC
  Administered 2014-11-23 (×2): 1 [drp] via OPHTHALMIC

## 2014-11-23 MED ORDER — POVIDONE-IODINE 5 % OP SOLN
OPHTHALMIC | Status: AC
  Filled 2014-11-23: qty 30

## 2014-11-23 MED ORDER — CYCLOPENTOLATE HCL 2 % OP SOLN
1.0000 [drp] | OPHTHALMIC | Status: AC
  Administered 2014-11-23 (×3): 1 [drp] via OPHTHALMIC

## 2014-11-23 MED ORDER — EPINEPHRINE HCL 1 MG/ML IJ SOLN
INTRAMUSCULAR | Status: AC
  Filled 2014-11-23: qty 1

## 2014-11-23 MED ORDER — TETRACAINE HCL 0.5 % OP SOLN: 1.0000 [drp] | OPHTHALMIC | Status: DC | PRN

## 2014-11-23 MED ORDER — FENTANYL CITRATE (PF) 100 MCG/2ML IJ SOLN
INTRAMUSCULAR | Status: DC | PRN
  Administered 2014-11-23: 50 ug via INTRAVENOUS

## 2014-11-23 MED ORDER — MIDAZOLAM HCL 2 MG/2ML IJ SOLN
INTRAMUSCULAR | Status: DC | PRN
  Administered 2014-11-23: 1 mg via INTRAVENOUS

## 2014-11-23 MED ORDER — ALFENTANIL 500 MCG/ML IJ INJ
INJECTION | INTRAMUSCULAR | Status: DC | PRN
  Administered 2014-11-23: 250 ug via INTRAVENOUS

## 2014-11-23 MED ORDER — BUPIVACAINE HCL (PF) 0.75 % IJ SOLN
INTRAMUSCULAR | Status: AC
  Filled 2014-11-23: qty 10

## 2014-11-23 MED ORDER — TETRACAINE HCL 0.5 % OP SOLN
OPHTHALMIC | Status: AC
  Administered 2014-11-23: 1 [drp] via OPHTHALMIC
  Filled 2014-11-23: qty 2

## 2014-11-23 MED ORDER — PHENYLEPHRINE HCL 10 % OP SOLN
OPHTHALMIC | Status: AC
  Administered 2014-11-23: 1 [drp] via OPHTHALMIC
  Filled 2014-11-23: qty 5

## 2014-11-23 MED ORDER — POLYMYXIN B-TRIMETHOPRIM 10000-0.1 UNIT/ML-% OP SOLN
OPHTHALMIC | Status: DC | PRN
  Administered 2014-11-23: 2 [drp]

## 2014-11-23 MED ORDER — NA HYALUR & NA CHOND-NA HYALUR 0.55-0.5 ML IO KIT
PACK | INTRAOCULAR | Status: AC
  Filled 2014-11-23: qty 1.05

## 2014-11-23 MED ORDER — LIDOCAINE HCL (PF) 4 % IJ SOLN
INTRAMUSCULAR | Status: AC
  Filled 2014-11-23: qty 5

## 2014-11-23 MED ORDER — SODIUM CHLORIDE 0.9 % IV SOLN
INTRAVENOUS | Status: DC
  Administered 2014-11-23 (×2): via INTRAVENOUS

## 2014-11-23 MED ORDER — ONDANSETRON HCL 4 MG/2ML IJ SOLN
INTRAMUSCULAR | Status: DC | PRN
  Administered 2014-11-23: 4 mg via INTRAVENOUS

## 2014-11-23 MED ORDER — PHENYLEPHRINE HCL 10 % OP SOLN
1.0000 [drp] | OPHTHALMIC | Status: AC
  Administered 2014-11-23 (×3): 1 [drp] via OPHTHALMIC

## 2014-11-23 MED ORDER — POLYMYXIN B-TRIMETHOPRIM 10000-0.1 UNIT/ML-% OP SOLN
OPHTHALMIC | Status: AC
  Administered 2014-11-23: 1 [drp] via OPHTHALMIC
  Filled 2014-11-23: qty 10

## 2014-11-23 MED ORDER — HYALURONIDASE HUMAN 150 UNIT/ML IJ SOLN
INTRAMUSCULAR | Status: AC
  Filled 2014-11-23: qty 1

## 2014-11-23 MED ORDER — CYCLOPENTOLATE HCL 2 % OP SOLN
OPHTHALMIC | Status: AC
  Administered 2014-11-23: 1 [drp] via OPHTHALMIC
  Filled 2014-11-23: qty 2

## 2014-11-23 MED ORDER — NA CHONDROIT SULF-NA HYALURON 40-30 MG/ML IO SOLN
INTRAOCULAR | Status: DC | PRN
Start: 2014-11-23 — End: 2014-11-23
  Administered 2014-11-23: 0.5 mL via INTRAOCULAR

## 2014-11-23 SURGICAL SUPPLY — 23 items
CUP MEDICINE 2OZ PLAST GRAD ST (MISCELLANEOUS) ×3 IMPLANT
EYE PAD ×2 IMPLANT
EYE SHIELD UNIVERSAL CLEAR (GAUZE/BANDAGES/DRESSINGS) ×2 IMPLANT
GLOVE BIO SURGEON STRL SZ7 (GLOVE) ×3 IMPLANT
GLOVE SURG LX 6.5 MICRO (GLOVE) ×4
GLOVE SURG LX STRL 6.5 MICRO (GLOVE) ×2 IMPLANT
GOWN STRL REUS W/ TWL LRG LVL3 (GOWN DISPOSABLE) ×2 IMPLANT
GOWN STRL REUS W/TWL LRG LVL3 (GOWN DISPOSABLE) ×6
IOL ×2 IMPLANT
KIT IRRIGAT 0.9 MICROSMOOTH (MISCELLANEOUS) ×2 IMPLANT
LENS IOL POST 21.5 (Intraocular Lens) ×2 IMPLANT
NDL FILTER BLUNT 18X1 1/2 (NEEDLE) ×1 IMPLANT
NEEDLE FILTER BLUNT 18X 1/2SAF (NEEDLE) ×2
NEEDLE FILTER BLUNT 18X1 1/2 (NEEDLE) ×1 IMPLANT
PACK CATARACT (MISCELLANEOUS) ×3 IMPLANT
PACK CATARACT BRASINGTON LX (MISCELLANEOUS) ×3 IMPLANT
PACK EYE AFTER SURG (MISCELLANEOUS) ×3 IMPLANT
SOL PREP PVP 2OZ (MISCELLANEOUS) ×3
SOLUTION PREP PVP 2OZ (MISCELLANEOUS) ×1 IMPLANT
SYR 3ML LL SCALE MARK (SYRINGE) ×6 IMPLANT
SYR TB 1ML 27GX1/2 LL (SYRINGE) ×3 IMPLANT
WATER STERILE IRR 1000ML POUR (IV SOLUTION) ×3 IMPLANT
WIPE NON LINTING 3.25X3.25 (MISCELLANEOUS) ×3 IMPLANT

## 2014-11-23 NOTE — Transfer of Care (Signed)
Immediate Anesthesia Transfer of Care Note  Patient: [name]  Procedure(s) Performed: Procedure(s): CATARACT EXTRACTION PHACO AND INTRAOCULAR LENS PLACEMENT (IOC) (Right)  Patient Location: PACU  Anesthesia Type:MAC  Level of Consciousness: awake, alert  and oriented  Airway & Oxygen Therapy: Patient Spontanous Breathing  Post-op Assessment: Report given to RN and Post -op Vital signs reviewed and stable  Post vital signs: Reviewed and stable  Last Vitals:  Filed Vitals:   11/23/14 0715  BP: 158/65  Pulse: 78  Temp: 36.8 C  Resp: 12    Complications: No apparent anesthesia complications

## 2014-11-23 NOTE — Anesthesia Postprocedure Evaluation (Signed)
  Anesthesia Post-op Note  Patient: [name]  Procedure(s) Performed: Procedure(s): CATARACT EXTRACTION PHACO AND INTRAOCULAR LENS PLACEMENT (IOC) (Right)  Anesthesia type:MAC  Patient location: PACU  Post pain: Pain level controlled  Post assessment: Post-op Vital signs reviewed, Patient's Cardiovascular Status Stable, Respiratory Function Stable, Patent Airway and No signs of Nausea or vomiting  Post vital signs: Reviewed and stable  Last Vitals:  Filed Vitals:   11/23/14 0715  BP: 158/65  Pulse: 78  Temp: 36.8 C  Resp: 12    Level of consciousness: awake, alert  and patient cooperative  Complications: No apparent anesthesia complications

## 2014-11-23 NOTE — Anesthesia Preprocedure Evaluation (Signed)
Anesthesia Evaluation  Patient identified by MRN, date of birth, ID band Patient awake    Reviewed: Allergy & Precautions, H&P , NPO status , Patient's Chart, lab work & pertinent test results  Airway Mallampati: III  TM Distance: >3 FB     Dental  (+) Upper Dentures, Lower Dentures   Pulmonary asthma , sleep apnea and Oxygen sleep apnea , COPDformer smoker,          Cardiovascular hypertension, + Valvular Problems/Murmurs     Neuro/Psych Seizures -,  CVA    GI/Hepatic   Endo/Other  diabetes, Type 2  Renal/GU      Musculoskeletal   Abdominal   Peds  Hematology   Anesthesia Other Findings No cpap. 2L  At nite. Cirrhosis.  Reproductive/Obstetrics                             Anesthesia Physical Anesthesia Plan  ASA: III  Anesthesia Plan: MAC   Post-op Pain Management:    Induction:   Airway Management Planned: Nasal Cannula  Additional Equipment:   Intra-op Plan:   Post-operative Plan:   Informed Consent: I have reviewed the patients History and Physical, chart, labs and discussed the procedure including the risks, benefits and alternatives for the proposed anesthesia with the patient or authorized representative who has indicated his/her understanding and acceptance.     Plan Discussed with: CRNA  Anesthesia Plan Comments:         Anesthesia Quick Evaluation

## 2014-11-23 NOTE — Progress Notes (Signed)
Glu 178

## 2014-11-23 NOTE — OR Nursing (Signed)
Pt. To OR procedure for block explained via lip reading.

## 2014-11-23 NOTE — Anesthesia Procedure Notes (Signed)
Procedure Name: MAC Date/Time: 11/23/2014 8:54 AM Performed by: Nelda Marseille Pre-anesthesia Checklist: Patient identified, Emergency Drugs available, Suction available, Patient being monitored and Timeout performed Patient Re-evaluated:Patient Re-evaluated prior to induction

## 2014-11-23 NOTE — Discharge Instructions (Signed)
POST OPERATIVE INSTRUCTIONS  DAY OF CATARACT SURGERY Your surgery went well.  Today, take it easy and protect the eye.  Dont bend over at the waist. Dont lift objects heavier than a jug of milk (10lbs). Dont let anything get in the eye other than the drops we give you. Keep the eye shield on at all times except to put in the drops. You may have a mild headache, soreness or scratchy sensation after surgery. Do not use any drops today, keep patch in place today and see [place] tomorrow for drop instructions.  If you have questions, call [facility] We will see you tomorrow in the eye clinic.

## 2014-11-23 NOTE — H&P (Signed)
The history and physical was faxed to the hospital. The history and physical was reviewed by me and no changes have occurred.

## 2014-11-23 NOTE — Op Note (Signed)
  11/23/2014  PRE-OP DIAGNOSIS: Cataract (ICD-10 H25.11) RIGHT EYE  Post operative diagnosis: Cataract (ICD-10 H25.11) RIGHT EYE  Procedure: Phacoemulsification with introcular lens [id number])   SURGEON: Surgeon(s) and Role:    * Lyla Glassing, MD - Primary  ANESTHESIA: Choice   ESTIMATED BLOOD LOSS: MINIMAL  COMPLICATIONS: None  OPERATIVE DESCRIPTION:   Therapeutic options were discussed with the patient preoperatively, including a discussion of risks and benefits of surgery.  Informed consent was obtained. A dilated fundus exam was performed within 6 months.   The patient was premedicated and brought to the operating room and placed on the operating table in the supine position. After adequate anesthesia was achieved, a retrobulbar block was administered in standard fashion. Then, the patient was prepped and draped in the usual fashion.  A wire lid speculum was inserted and the microscope was positioned.  A sideport was used to create a paracentesis site and a mixture of preservative-free lidocaine, BSS, and epinephrine was was instilled into the anterior chamber, followed by viscoelastic.  A clear corneal incision was created using a keratome blade.  Capsulorrhexis was then performed.  In situ phacoemulsification was performed.  Cortical material was removed with the irrigation-aspiration unit.  Viscoelastic was instilled to open the capsular bag.  A posterior chamber intraocular lens, model 21.5 diopters, was inserted and positioned.  Irrigation-aspiration was used to remove all viscoelastic.Wounds were checked for leakage and confirmed to be secure.  Lid speculum was removed and a shield was placed over the eye.  Patient was returned to the recovery room in stable condition. IMPLANTS:   Implant Name Type Inv. Item Serial No. Manufacturer Lot No. LRB No. Used  IOL     [id number] ALCON   Right 1     Postoperative care and discharge medication counseling was discussed with  the patient or the parents prior to discharge

## 2014-11-23 NOTE — Progress Notes (Signed)
Potassium 2.8, Dr Marcello Moores aware.

## 2014-11-24 ENCOUNTER — Encounter: Payer: Self-pay | Admitting: Ophthalmology

## 2014-12-12 ENCOUNTER — Encounter: Payer: Self-pay | Admitting: *Deleted

## 2014-12-14 ENCOUNTER — Encounter
Admission: RE | Disposition: A | Discharge status: Home / Self Care | Payer: Self-pay | Source: Ambulatory Visit | Attending: Ophthalmology

## 2014-12-14 ENCOUNTER — Ambulatory Visit: Payer: Medicaid Other | Admitting: Anesthesiology

## 2014-12-14 ENCOUNTER — Ambulatory Visit
Admission: RE | Admit: 2014-12-14 | Discharge: 2014-12-14 | Disposition: A | Discharge status: Home / Self Care | Payer: Medicaid Other | Source: Ambulatory Visit | Attending: Ophthalmology | Admitting: Ophthalmology

## 2014-12-14 ENCOUNTER — Encounter: Payer: Self-pay | Admitting: *Deleted

## 2014-12-14 DIAGNOSIS — G2581 Restless legs syndrome: Secondary | ICD-10-CM | POA: Diagnosis not present

## 2014-12-14 DIAGNOSIS — H269 Unspecified cataract: Secondary | ICD-10-CM | POA: Diagnosis present

## 2014-12-14 DIAGNOSIS — H2511 Age-related nuclear cataract, right eye: Secondary | ICD-10-CM | POA: Insufficient documentation

## 2014-12-14 DIAGNOSIS — I10 Essential (primary) hypertension: Secondary | ICD-10-CM | POA: Insufficient documentation

## 2014-12-14 DIAGNOSIS — Z8673 Personal history of transient ischemic attack (TIA), and cerebral infarction without residual deficits: Secondary | ICD-10-CM | POA: Diagnosis not present

## 2014-12-14 DIAGNOSIS — Z880 Allergy status to penicillin: Secondary | ICD-10-CM | POA: Diagnosis not present

## 2014-12-14 DIAGNOSIS — G4730 Sleep apnea, unspecified: Secondary | ICD-10-CM | POA: Insufficient documentation

## 2014-12-14 DIAGNOSIS — K7469 Other cirrhosis of liver: Secondary | ICD-10-CM | POA: Diagnosis not present

## 2014-12-14 DIAGNOSIS — H9190 Unspecified hearing loss, unspecified ear: Secondary | ICD-10-CM | POA: Insufficient documentation

## 2014-12-14 DIAGNOSIS — G629 Polyneuropathy, unspecified: Secondary | ICD-10-CM | POA: Diagnosis not present

## 2014-12-14 DIAGNOSIS — F329 Major depressive disorder, single episode, unspecified: Secondary | ICD-10-CM | POA: Insufficient documentation

## 2014-12-14 DIAGNOSIS — Z9049 Acquired absence of other specified parts of digestive tract: Secondary | ICD-10-CM | POA: Insufficient documentation

## 2014-12-14 DIAGNOSIS — R0601 Orthopnea: Secondary | ICD-10-CM | POA: Insufficient documentation

## 2014-12-14 DIAGNOSIS — K219 Gastro-esophageal reflux disease without esophagitis: Secondary | ICD-10-CM | POA: Insufficient documentation

## 2014-12-14 DIAGNOSIS — Z886 Allergy status to analgesic agent status: Secondary | ICD-10-CM | POA: Diagnosis not present

## 2014-12-14 DIAGNOSIS — Z885 Allergy status to narcotic agent status: Secondary | ICD-10-CM | POA: Diagnosis not present

## 2014-12-14 DIAGNOSIS — Z7951 Long term (current) use of inhaled steroids: Secondary | ICD-10-CM | POA: Insufficient documentation

## 2014-12-14 DIAGNOSIS — Z9981 Dependence on supplemental oxygen: Secondary | ICD-10-CM | POA: Insufficient documentation

## 2014-12-14 DIAGNOSIS — R011 Cardiac murmur, unspecified: Secondary | ICD-10-CM | POA: Diagnosis not present

## 2014-12-14 DIAGNOSIS — Z881 Allergy status to other antibiotic agents status: Secondary | ICD-10-CM | POA: Insufficient documentation

## 2014-12-14 DIAGNOSIS — K7589 Other specified inflammatory liver diseases: Secondary | ICD-10-CM | POA: Diagnosis not present

## 2014-12-14 DIAGNOSIS — Z882 Allergy status to sulfonamides status: Secondary | ICD-10-CM | POA: Insufficient documentation

## 2014-12-14 DIAGNOSIS — E119 Type 2 diabetes mellitus without complications: Secondary | ICD-10-CM | POA: Insufficient documentation

## 2014-12-14 DIAGNOSIS — R569 Unspecified convulsions: Secondary | ICD-10-CM | POA: Insufficient documentation

## 2014-12-14 HISTORY — PX: CATARACT EXTRACTION W/PHACO: SHX586

## 2014-12-14 LAB — GLUCOSE, CAPILLARY: Glucose-Capillary: 179 mg/dL — ABNORMAL HIGH (ref 65–99)

## 2014-12-14 SURGERY — PHACOEMULSIFICATION, CATARACT, WITH IOL INSERTION
Anesthesia: Monitor Anesthesia Care | Laterality: Left | Wound class: Clean

## 2014-12-14 MED ORDER — NA HYALUR & NA CHOND-NA HYALUR 0.4-0.35 ML IO KIT
PACK | INTRAOCULAR | Status: DC | PRN
  Administered 2014-12-14: .75 mL via INTRAOCULAR

## 2014-12-14 MED ORDER — POLYMYXIN B-TRIMETHOPRIM 10000-0.1 UNIT/ML-% OP SOLN
1.0000 [drp] | OPHTHALMIC | Status: AC | PRN
  Administered 2014-12-14 (×3): 1 [drp] via OPHTHALMIC

## 2014-12-14 MED ORDER — PHENYLEPHRINE HCL 10 % OP SOLN
OPHTHALMIC | Status: AC
  Administered 2014-12-14: 1 [drp] via OPHTHALMIC
  Filled 2014-12-14: qty 5

## 2014-12-14 MED ORDER — LIDOCAINE HCL (PF) 1 % IJ SOLN
INTRAOCULAR | Status: DC | PRN
  Administered 2014-12-14: 4 mL via OPHTHALMIC

## 2014-12-14 MED ORDER — EPINEPHRINE HCL 1 MG/ML IJ SOLN
INTRAMUSCULAR | Status: AC
Start: 2014-12-14 — End: 2014-12-14
  Filled 2014-12-14: qty 1

## 2014-12-14 MED ORDER — LIDOCAINE HCL (PF) 4 % IJ SOLN
INTRAMUSCULAR | Status: AC
  Filled 2014-12-14: qty 5

## 2014-12-14 MED ORDER — LIDOCAINE HCL (CARDIAC) 20 MG/ML IV SOLN
INTRAVENOUS | Status: DC | PRN
  Administered 2014-12-14: 60 mg via INTRAVENOUS

## 2014-12-14 MED ORDER — HYALURONIDASE HUMAN 150 UNIT/ML IJ SOLN
INTRAMUSCULAR | Status: AC
  Filled 2014-12-14: qty 1

## 2014-12-14 MED ORDER — CEFUROXIME OPHTHALMIC INJECTION 1 MG/0.1 ML
INJECTION | OPHTHALMIC | Status: AC
  Filled 2014-12-14: qty 0.1

## 2014-12-14 MED ORDER — POLYMYXIN B-TRIMETHOPRIM 10000-0.1 UNIT/ML-% OP SOLN
OPHTHALMIC | Status: DC | PRN
  Administered 2014-12-14: 2 [drp]

## 2014-12-14 MED ORDER — CYCLOPENTOLATE HCL 2 % OP SOLN
1.0000 [drp] | OPHTHALMIC | Status: DC | PRN
  Administered 2014-12-14 (×3): 1 [drp] via OPHTHALMIC

## 2014-12-14 MED ORDER — MIDAZOLAM HCL 2 MG/2ML IJ SOLN
INTRAMUSCULAR | Status: DC | PRN
  Administered 2014-12-14 (×2): 1 mg via INTRAVENOUS

## 2014-12-14 MED ORDER — PROPOFOL INFUSION 10 MG/ML OPTIME
INTRAVENOUS | Status: DC | PRN
  Administered 2014-12-14: 35 ug/kg/min via INTRAVENOUS

## 2014-12-14 MED ORDER — CYCLOPENTOLATE HCL 2 % OP SOLN
OPHTHALMIC | Status: AC
  Administered 2014-12-14: 1 [drp] via OPHTHALMIC
  Filled 2014-12-14: qty 2

## 2014-12-14 MED ORDER — ONDANSETRON HCL 4 MG/2ML IJ SOLN
INTRAMUSCULAR | Status: DC | PRN
  Administered 2014-12-14: 4 mg via INTRAVENOUS

## 2014-12-14 MED ORDER — PHENYLEPHRINE HCL 10 % OP SOLN
1.0000 [drp] | OPHTHALMIC | Status: DC | PRN
  Administered 2014-12-14 (×3): 1 [drp] via OPHTHALMIC

## 2014-12-14 MED ORDER — TETRACAINE HCL 0.5 % OP SOLN
OPHTHALMIC | Status: AC
  Administered 2014-12-14: 1 [drp] via OPHTHALMIC
  Filled 2014-12-14: qty 2

## 2014-12-14 MED ORDER — TETRACAINE HCL 0.5 % OP SOLN
1.0000 [drp] | OPHTHALMIC | Status: DC | PRN
  Administered 2014-12-14: 1 [drp] via OPHTHALMIC

## 2014-12-14 MED ORDER — LIDOCAINE HCL (PF) 4 % IJ SOLN
INTRAMUSCULAR | Status: DC | PRN
  Administered 2014-12-14: 3 mL via OPHTHALMIC

## 2014-12-14 MED ORDER — PROPOFOL 10 MG/ML IV BOLUS
INTRAVENOUS | Status: DC | PRN
  Administered 2014-12-14: 20 mg via INTRAVENOUS
  Administered 2014-12-14: 40 mg via INTRAVENOUS

## 2014-12-14 MED ORDER — BUPIVACAINE HCL (PF) 0.75 % IJ SOLN
INTRAMUSCULAR | Status: AC
  Filled 2014-12-14: qty 10

## 2014-12-14 MED ORDER — DEXAMETHASONE SODIUM PHOSPHATE 4 MG/ML IJ SOLN
INTRAMUSCULAR | Status: DC | PRN
  Administered 2014-12-14: 8 mg via INTRAVENOUS

## 2014-12-14 MED ORDER — POLYMYXIN B-TRIMETHOPRIM 10000-0.1 UNIT/ML-% OP SOLN
OPHTHALMIC | Status: AC
  Administered 2014-12-14: 1 [drp] via OPHTHALMIC
  Filled 2014-12-14: qty 10

## 2014-12-14 MED ORDER — NA HYALUR & NA CHOND-NA HYALUR 0.55-0.5 ML IO KIT
PACK | INTRAOCULAR | Status: AC
  Filled 2014-12-14: qty 1.05

## 2014-12-14 MED ORDER — SODIUM CHLORIDE 0.9 % IV SOLN
INTRAVENOUS | Status: DC
  Administered 2014-12-14 (×2): via INTRAVENOUS

## 2014-12-14 MED ORDER — BSS IO SOLN
INTRAOCULAR | Status: DC | PRN
  Administered 2014-12-14: 500 mL

## 2014-12-14 SURGICAL SUPPLY — 22 items
CUP MEDICINE 2OZ PLAST GRAD ST (MISCELLANEOUS) ×3 IMPLANT
GLOVE BIO SURGEON STRL SZ7 (GLOVE) ×3 IMPLANT
GLOVE SURG LX 6.5 MICRO (GLOVE) ×4
GLOVE SURG LX STRL 6.5 MICRO (GLOVE) ×2 IMPLANT
GOWN STRL REUS W/ TWL LRG LVL3 (GOWN DISPOSABLE) ×2 IMPLANT
GOWN STRL REUS W/TWL LRG LVL3 (GOWN DISPOSABLE) ×6
LENS IOL ACRSF IQ PC 21.0 (Intraocular Lens) IMPLANT
LENS IOL ACRYSOF IQ POST 21.0 (Intraocular Lens) ×3 IMPLANT
NDL FILTER BLUNT 18X1 1/2 (NEEDLE) ×1 IMPLANT
NEEDLE FILTER BLUNT 18X 1/2SAF (NEEDLE) ×2
NEEDLE FILTER BLUNT 18X1 1/2 (NEEDLE) ×1 IMPLANT
PACK CATARACT (MISCELLANEOUS) ×3 IMPLANT
PACK CATARACT BRASINGTON LX (MISCELLANEOUS) ×3 IMPLANT
PACK EYE AFTER SURG (MISCELLANEOUS) ×3 IMPLANT
SOL BSS BAG (MISCELLANEOUS) ×3
SOL PREP PVP 2OZ (MISCELLANEOUS) ×3
SOLUTION BSS BAG (MISCELLANEOUS) ×1 IMPLANT
SOLUTION PREP PVP 2OZ (MISCELLANEOUS) ×1 IMPLANT
SYR 3ML LL SCALE MARK (SYRINGE) ×6 IMPLANT
SYR TB 1ML 27GX1/2 LL (SYRINGE) ×3 IMPLANT
WATER STERILE IRR 1000ML POUR (IV SOLUTION) ×3 IMPLANT
WIPE NON LINTING 3.25X3.25 (MISCELLANEOUS) ×3 IMPLANT

## 2014-12-14 NOTE — H&P (Signed)
The history and physical was faxed to the hospital. The history and physical was reviewed by me and no changes have occurred.   

## 2014-12-14 NOTE — Anesthesia Preprocedure Evaluation (Addendum)
Anesthesia Evaluation  Patient identified by MRN, date of birth, ID band Patient awake    Reviewed: Allergy & Precautions, NPO status , Patient's Chart, lab work & pertinent test results  Airway Mallampati: II  TM Distance: <3 FB Neck ROM: Full    Dental  (+) Upper Dentures, Lower Dentures   Pulmonary shortness of breath, asthma , sleep apnea , COPD COPD inhaler and oxygen dependent, Current Smoker (1 ppd), former smoker,          Cardiovascular hypertension, Pt. on medications + Valvular Problems/Murmurs (no tx)     Neuro/Psych Seizures - (last 2 yrs ago), Well Controlled,  Depression CVA (L sded weakness), No Residual Symptoms    GI/Hepatic GERD-  Medicated and Controlled,(+) Hepatitis -  Endo/Other  diabetes, Well Controlled, Type 2, Oral Hypoglycemic Agents  Renal/GU      Musculoskeletal   Abdominal   Peds  Hematology   Anesthesia Other Findings   Reproductive/Obstetrics                            Anesthesia Physical Anesthesia Plan  ASA: III  Anesthesia Plan: General   Post-op Pain Management:    Induction: Intravenous  Airway Management Planned: Nasal Cannula  Additional Equipment:   Intra-op Plan:   Post-operative Plan:   Informed Consent: I have reviewed the patients History and Physical, chart, labs and discussed the procedure including the risks, benefits and alternatives for the proposed anesthesia with the patient or authorized representative who has indicated his/her understanding and acceptance.     Plan Discussed with: CRNA, Anesthesiologist and Surgeon  Anesthesia Plan Comments:        Anesthesia Quick Evaluation

## 2014-12-14 NOTE — Op Note (Addendum)
12/14/2014  PRE-OP DIAGNOSIS: Cataract (ICD-10 H25.12) LEFT EYE Regular Corneal Astigmatism [ICD- 10: H52.2 , ICD-9 367.20]  LEFT EYE  Post operative diagnosis: Cataract (ICD-10 H25.12) LEFT EYE Regular Corneal Astigmatism [ICD- 10: H52.2 , ICD-9 367.20]  LEFT EYE  Procedure: Phacoemulsification with introcular lens [id number])   SURGEON: Surgeon(s) and Role:    * Lyla Glassing, MD - Primary  ANESTHESIA: Choice   ESTIMATED BLOOD LOSS: MINIMAL  COMPLICATIONS: None  OPERATIVE DESCRIPTION:  Therapeutic options were discussed with the patient preoperatively, including a discussion of risks and benefits of surgery.  Informed consent was obtained. A dilated fundus exam was performed within 6 months.   The patient was premedicated and brought to the operating room and placed on the operating table in the supine position.  Topical tetracaine was instilled.  After adequate anesthesia, a retrobulbar block was administered to the left eye. Then, the patient was prepped and draped in the usual fashion.  A wire lid speculum was inserted and the microscope was positioned.  A sideport was used to create a paracentesis site and a mixture of preservative-free lidocaine, BSS, and epinephrine was was instilled into the anterior chamber, followed by viscoelastic.  A clear corneal incision was created using a keratome blade.  Capsulorrhexis was then performed.  In situ phacoemulsification was performed.  Cortical material was removed with the irrigation-aspiration unit.  Viscoelastic was instilled to open the capsular bag.  A posterior chamber intraocular lens, model 21.0 diopters, was inserted and positioned.  Irrigation-aspiration was used to remove all viscoelastic. Wounds were checked for leakage and confirmed to be secure.  Lid speculum was removed and a shield was placed over the eye.  Patient was returned to the recovery room in stable condition.  IMPLANTS:   Implant Name Type Inv. Item Serial No.  Manufacturer Lot No. LRB No. Used  IMPLANT LENS - [id number] Intraocular Lens IMPLANT LENS [id number] ALCON   Left 1     Postoperative care and discharge medication counseling was discussed with the patient or the parents prior to discharge   .

## 2014-12-14 NOTE — OR Nursing (Signed)
INTERPRETER PRESENT FOR PRE-OP CONSENTS AND PREPARATION.  (ELLEN).

## 2014-12-14 NOTE — Progress Notes (Signed)
Pt right FA IV 22 G infusing well on arrival to post op. D/c'd intact.

## 2014-12-14 NOTE — Discharge Instructions (Signed)
POST OPERATIVE INSTRUCTIONS  DAY OF CATARACT SURGERY Your surgery went well.  Today, take it easy and protect the eye.  Dont bend over at the waist. Dont lift objects heavier than a jug of milk (10lbs). Dont let anything get in the eye other than the drops we give you. Keep the eye shield on at all times except to put in the drops. You may have a mild headache, soreness or scratchy sensation after surgery. EYE DROPS AFTER SURGERY          Durezol Steroid (SHAKE WELL) Ilevro Anti-inflammatory Polytrim Antibiotic       2 times a day Once daily 4 times a day      Wait 5 minutes between drops  If you have questions, call [facility] We will see you tomorrow in the eye clinic.

## 2014-12-14 NOTE — Anesthesia Postprocedure Evaluation (Signed)
  Anesthesia Post-op Note  Patient: [name]  Procedure(s) Performed: Procedure(s) with comments: CATARACT EXTRACTION PHACO AND INTRAOCULAR LENS PLACEMENT (IOC) (Left) - US:01:16.6 AP:15.8 CDE:12.14  Anesthesia type:MAC  Patient location: PACU  Post pain: Pain level controlled  Post assessment: Post-op Vital signs reviewed, Patient's Cardiovascular Status Stable, Respiratory Function Stable, Patent Airway and No signs of Nausea or vomiting  Post vital signs: Reviewed and stable  Last Vitals:  Filed Vitals:   12/14/14 0959  BP: 121/49  Pulse: 61  Temp:   Resp: 16    Level of consciousness: awake, alert  and patient cooperative  Complications: No apparent anesthesia complications

## 2014-12-14 NOTE — Transfer of Care (Signed)
Immediate Anesthesia Transfer of Care Note  Patient: [name]  Procedure(s) Performed: Procedure(s) with comments: CATARACT EXTRACTION PHACO AND INTRAOCULAR LENS PLACEMENT (IOC) (Left) - US:01:16.6 AP:15.8 CDE:12.14  Patient Location: PACU  Anesthesia Type:General  Level of Consciousness: awake and patient cooperative  Airway & Oxygen Therapy: Patient Spontanous Breathing and Patient connected to nasal cannula oxygen  Post-op Assessment: Report given to RN and Post -op Vital signs reviewed and stable  Post vital signs: Reviewed and stable  Last Vitals:  Filed Vitals:   12/14/14 0929  BP: 124/53  Pulse: 62  Temp: 36 C  Resp: 16    Complications: No apparent anesthesia complications

## 2015-03-29 ENCOUNTER — Encounter: Payer: Self-pay | Admitting: Emergency Medicine

## 2015-03-29 ENCOUNTER — Emergency Department: Payer: Medicaid Other

## 2015-03-29 ENCOUNTER — Inpatient Hospital Stay
Admission: EM | Admit: 2015-03-29 | Discharge: 2015-03-31 | DRG: 190 | Disposition: A | Discharge status: Home Health | Payer: Medicaid Other | Attending: Internal Medicine | Admitting: Internal Medicine

## 2015-03-29 DIAGNOSIS — G629 Polyneuropathy, unspecified: Secondary | ICD-10-CM | POA: Diagnosis present

## 2015-03-29 DIAGNOSIS — E119 Type 2 diabetes mellitus without complications: Secondary | ICD-10-CM | POA: Diagnosis present

## 2015-03-29 DIAGNOSIS — Z882 Allergy status to sulfonamides status: Secondary | ICD-10-CM

## 2015-03-29 DIAGNOSIS — M069 Rheumatoid arthritis, unspecified: Secondary | ICD-10-CM | POA: Diagnosis present

## 2015-03-29 DIAGNOSIS — Z880 Allergy status to penicillin: Secondary | ICD-10-CM

## 2015-03-29 DIAGNOSIS — Z888 Allergy status to other drugs, medicaments and biological substances status: Secondary | ICD-10-CM | POA: Diagnosis not present

## 2015-03-29 DIAGNOSIS — Z79899 Other long term (current) drug therapy: Secondary | ICD-10-CM

## 2015-03-29 DIAGNOSIS — J9601 Acute respiratory failure with hypoxia: Secondary | ICD-10-CM | POA: Diagnosis present

## 2015-03-29 DIAGNOSIS — F17210 Nicotine dependence, cigarettes, uncomplicated: Secondary | ICD-10-CM | POA: Diagnosis present

## 2015-03-29 DIAGNOSIS — J45909 Unspecified asthma, uncomplicated: Secondary | ICD-10-CM | POA: Diagnosis present

## 2015-03-29 DIAGNOSIS — M1990 Unspecified osteoarthritis, unspecified site: Secondary | ICD-10-CM | POA: Diagnosis present

## 2015-03-29 DIAGNOSIS — H9190 Unspecified hearing loss, unspecified ear: Secondary | ICD-10-CM | POA: Diagnosis present

## 2015-03-29 DIAGNOSIS — G2581 Restless legs syndrome: Secondary | ICD-10-CM | POA: Diagnosis present

## 2015-03-29 DIAGNOSIS — Z881 Allergy status to other antibiotic agents status: Secondary | ICD-10-CM

## 2015-03-29 DIAGNOSIS — Z9981 Dependence on supplemental oxygen: Secondary | ICD-10-CM | POA: Diagnosis not present

## 2015-03-29 DIAGNOSIS — G40909 Epilepsy, unspecified, not intractable, without status epilepticus: Secondary | ICD-10-CM | POA: Diagnosis present

## 2015-03-29 DIAGNOSIS — Z6830 Body mass index (BMI) 30.0-30.9, adult: Secondary | ICD-10-CM

## 2015-03-29 DIAGNOSIS — Z886 Allergy status to analgesic agent status: Secondary | ICD-10-CM | POA: Diagnosis not present

## 2015-03-29 DIAGNOSIS — F329 Major depressive disorder, single episode, unspecified: Secondary | ICD-10-CM | POA: Diagnosis present

## 2015-03-29 DIAGNOSIS — Z8673 Personal history of transient ischemic attack (TIA), and cerebral infarction without residual deficits: Secondary | ICD-10-CM | POA: Diagnosis not present

## 2015-03-29 DIAGNOSIS — J441 Chronic obstructive pulmonary disease with (acute) exacerbation: Secondary | ICD-10-CM | POA: Diagnosis not present

## 2015-03-29 DIAGNOSIS — I10 Essential (primary) hypertension: Secondary | ICD-10-CM | POA: Diagnosis present

## 2015-03-29 DIAGNOSIS — G4730 Sleep apnea, unspecified: Secondary | ICD-10-CM | POA: Diagnosis present

## 2015-03-29 DIAGNOSIS — E669 Obesity, unspecified: Secondary | ICD-10-CM | POA: Diagnosis present

## 2015-03-29 DIAGNOSIS — Z885 Allergy status to narcotic agent status: Secondary | ICD-10-CM

## 2015-03-29 DIAGNOSIS — Z7901 Long term (current) use of anticoagulants: Secondary | ICD-10-CM | POA: Diagnosis not present

## 2015-03-29 DIAGNOSIS — Z87891 Personal history of nicotine dependence: Secondary | ICD-10-CM

## 2015-03-29 DIAGNOSIS — K219 Gastro-esophageal reflux disease without esophagitis: Secondary | ICD-10-CM | POA: Diagnosis present

## 2015-03-29 DIAGNOSIS — K7460 Unspecified cirrhosis of liver: Secondary | ICD-10-CM | POA: Diagnosis present

## 2015-03-29 LAB — CBC
HEMATOCRIT: 47.9 % — AB (ref 35.0–47.0)
HEMOGLOBIN: 15.9 g/dL (ref 12.0–16.0)
MCH: 28 pg (ref 26.0–34.0)
MCHC: 33.2 g/dL (ref 32.0–36.0)
MCV: 84.4 fL (ref 80.0–100.0)
Platelets: 268 10*3/uL (ref 150–440)
RBC: 5.67 MIL/uL — ABNORMAL HIGH (ref 3.80–5.20)
RDW: 13.3 % (ref 11.5–14.5)
WBC: 8.1 10*3/uL (ref 3.6–11.0)

## 2015-03-29 LAB — GLUCOSE, CAPILLARY
Glucose-Capillary: 348 mg/dL — ABNORMAL HIGH (ref 65–99)
Glucose-Capillary: 330 mg/dL — ABNORMAL HIGH (ref 65–99)

## 2015-03-29 LAB — BASIC METABOLIC PANEL
ANION GAP: 9 (ref 5–15)
BUN: 12 mg/dL (ref 6–20)
CHLORIDE: 98 mmol/L — AB (ref 101–111)
CO2: 27 mmol/L (ref 22–32)
Calcium: 8.9 mg/dL (ref 8.9–10.3)
Creatinine, Ser: 0.55 mg/dL (ref 0.44–1.00)
GFR calc Af Amer: >60 mL/min (ref >60)
Glucose, Bld: 249 mg/dL — ABNORMAL HIGH (ref 65–99)
POTASSIUM: 3.2 mmol/L — AB (ref 3.5–5.1)
SODIUM: 134 mmol/L — AB (ref 135–145)

## 2015-03-29 LAB — HEMOGLOBIN A1C: HEMOGLOBIN A1C: 7.2 % — AB (ref 4.0–6.0)

## 2015-03-29 LAB — TROPONIN I: Troponin I: <0.03 ng/mL (ref <0.031)

## 2015-03-29 MED ORDER — ACETAMINOPHEN 650 MG RE SUPP: 650.0000 mg | Freq: Four times a day (QID) | RECTAL | Status: DC | PRN

## 2015-03-29 MED ORDER — POTASSIUM CHLORIDE CRYS ER 20 MEQ PO TBCR
20.0000 meq | EXTENDED_RELEASE_TABLET | Freq: Two times a day (BID) | ORAL | Status: DC
  Administered 2015-03-29 – 2015-03-31 (×4): 20 meq via ORAL
  Filled 2015-03-29 (×4): qty 1

## 2015-03-29 MED ORDER — METHYLPREDNISOLONE SODIUM SUCC 125 MG IJ SOLR
125.0000 mg | Freq: Once | INTRAMUSCULAR | Status: AC
Start: 2015-03-29 — End: 2015-03-29
  Administered 2015-03-29: 125 mg via INTRAVENOUS
  Filled 2015-03-29: qty 2

## 2015-03-29 MED ORDER — CARBOXYMETHYLCELLULOSE SODIUM 0.5 % OP SOLN: 1.0000 [drp] | Freq: Every day | OPHTHALMIC | Status: DC | PRN

## 2015-03-29 MED ORDER — BUDESONIDE 0.25 MG/2ML IN SUSP
0.2500 mg | Freq: Two times a day (BID) | RESPIRATORY_TRACT | Status: DC
  Administered 2015-03-29 – 2015-03-31 (×4): 0.25 mg via RESPIRATORY_TRACT
  Filled 2015-03-29 (×4): qty 2

## 2015-03-29 MED ORDER — DOXYCYCLINE HYCLATE 100 MG PO TABS
100.0000 mg | ORAL_TABLET | Freq: Two times a day (BID) | ORAL | Status: DC
  Administered 2015-03-29 – 2015-03-31 (×5): 100 mg via ORAL
  Filled 2015-03-29 (×5): qty 1

## 2015-03-29 MED ORDER — IPRATROPIUM-ALBUTEROL 0.5-2.5 (3) MG/3ML IN SOLN
3.0000 mL | Freq: Once | RESPIRATORY_TRACT | Status: AC
  Administered 2015-03-29: 3 mL via RESPIRATORY_TRACT
  Filled 2015-03-29: qty 3

## 2015-03-29 MED ORDER — ENOXAPARIN SODIUM 40 MG/0.4ML ~~LOC~~ SOLN
40.0000 mg | SUBCUTANEOUS | Status: DC
  Administered 2015-03-29 – 2015-03-30 (×2): 40 mg via SUBCUTANEOUS
  Filled 2015-03-29 (×2): qty 0.4

## 2015-03-29 MED ORDER — DIPHENHYDRAMINE HCL (SLEEP) 25 MG PO TABS: 25.0000 mg | ORAL_TABLET | Freq: Every evening | ORAL | Status: DC | PRN

## 2015-03-29 MED ORDER — METHYLPREDNISOLONE SODIUM SUCC 125 MG IJ SOLR
60.0000 mg | Freq: Four times a day (QID) | INTRAMUSCULAR | Status: DC
  Administered 2015-03-29 – 2015-03-31 (×7): 60 mg via INTRAVENOUS
  Filled 2015-03-29 (×7): qty 2

## 2015-03-29 MED ORDER — POLYVINYL ALCOHOL 1.4 % OP SOLN
1.0000 [drp] | Freq: Every day | OPHTHALMIC | Status: DC | PRN
  Filled 2015-03-29: qty 15

## 2015-03-29 MED ORDER — HYDROCHLOROTHIAZIDE 25 MG PO TABS
25.0000 mg | ORAL_TABLET | Freq: Every day | ORAL | Status: DC
  Administered 2015-03-30 – 2015-03-31 (×2): 25 mg via ORAL
  Filled 2015-03-29 (×2): qty 1

## 2015-03-29 MED ORDER — INSULIN ASPART 100 UNIT/ML ~~LOC~~ SOLN
0.0000 [IU] | Freq: Every day | SUBCUTANEOUS | Status: DC
  Administered 2015-03-29: 4 [IU] via SUBCUTANEOUS
  Filled 2015-03-29: qty 4

## 2015-03-29 MED ORDER — SODIUM CHLORIDE 0.9 % IJ SOLN
3.0000 mL | Freq: Two times a day (BID) | INTRAMUSCULAR | Status: DC
  Administered 2015-03-29 – 2015-03-30 (×4): 3 mL via INTRAVENOUS

## 2015-03-29 MED ORDER — AMLODIPINE BESYLATE 10 MG PO TABS
10.0000 mg | ORAL_TABLET | Freq: Every day | ORAL | Status: DC
  Administered 2015-03-30 – 2015-03-31 (×2): 10 mg via ORAL
  Filled 2015-03-29 (×2): qty 1

## 2015-03-29 MED ORDER — ACETAMINOPHEN 325 MG PO TABS: 650.0000 mg | ORAL_TABLET | Freq: Four times a day (QID) | ORAL | Status: DC | PRN

## 2015-03-29 MED ORDER — NAPROXEN 500 MG PO TABS
500.0000 mg | ORAL_TABLET | Freq: Two times a day (BID) | ORAL | Status: DC
  Administered 2015-03-29 – 2015-03-31 (×4): 500 mg via ORAL
  Filled 2015-03-29 (×5): qty 1

## 2015-03-29 MED ORDER — NEPAFENAC 0.3 % OP SUSP: 1.0000 [drp] | Freq: Every day | OPHTHALMIC | Status: DC

## 2015-03-29 MED ORDER — INSULIN ASPART 100 UNIT/ML ~~LOC~~ SOLN
0.0000 [IU] | Freq: Three times a day (TID) | SUBCUTANEOUS | Status: DC
  Administered 2015-03-29: 3 [IU] via SUBCUTANEOUS
  Administered 2015-03-29: 7 [IU] via SUBCUTANEOUS
  Administered 2015-03-30: 3 [IU] via SUBCUTANEOUS
  Filled 2015-03-29 (×2): qty 3
  Filled 2015-03-29: qty 7
  Filled 2015-03-29: qty 2

## 2015-03-29 MED ORDER — IPRATROPIUM-ALBUTEROL 0.5-2.5 (3) MG/3ML IN SOLN
3.0000 mL | RESPIRATORY_TRACT | Status: DC
  Administered 2015-03-29 – 2015-03-31 (×7): 3 mL via RESPIRATORY_TRACT
  Filled 2015-03-29 (×7): qty 3

## 2015-03-29 MED ORDER — GLIPIZIDE ER 10 MG PO TB24
10.0000 mg | ORAL_TABLET | Freq: Every day | ORAL | Status: DC
  Administered 2015-03-30 – 2015-03-31 (×2): 10 mg via ORAL
  Filled 2015-03-29 (×3): qty 1

## 2015-03-29 MED ORDER — CLOPIDOGREL BISULFATE 75 MG PO TABS
75.0000 mg | ORAL_TABLET | Freq: Every day | ORAL | Status: DC
  Administered 2015-03-30 – 2015-03-31 (×2): 75 mg via ORAL
  Filled 2015-03-29 (×2): qty 1

## 2015-03-29 MED ORDER — CARBOXYMETHYLCELLULOSE SODIUM 0.5 % OP SOLN
1.0000 [drp] | Freq: Every day | OPHTHALMIC | Status: DC | PRN
  Filled 2015-03-29: qty 1

## 2015-03-29 MED ORDER — ENALAPRIL MALEATE 10 MG PO TABS
20.0000 mg | ORAL_TABLET | Freq: Every day | ORAL | Status: DC
  Administered 2015-03-30 – 2015-03-31 (×2): 20 mg via ORAL
  Filled 2015-03-29 (×2): qty 2

## 2015-03-29 MED ORDER — NICOTINE 10 MG IN INHA: 1.0000 | RESPIRATORY_TRACT | Status: DC | PRN

## 2015-03-29 MED ORDER — PREGABALIN 75 MG PO CAPS
150.0000 mg | ORAL_CAPSULE | Freq: Every day | ORAL | Status: DC
  Administered 2015-03-29 – 2015-03-30 (×2): 150 mg via ORAL
  Filled 2015-03-29 (×2): qty 2

## 2015-03-29 MED ORDER — ALBUTEROL SULFATE (2.5 MG/3ML) 0.083% IN NEBU
5.0000 mg | INHALATION_SOLUTION | Freq: Once | RESPIRATORY_TRACT | Status: AC
  Administered 2015-03-29: 5 mg via RESPIRATORY_TRACT

## 2015-03-29 MED ORDER — PANTOPRAZOLE SODIUM 40 MG PO TBEC
40.0000 mg | DELAYED_RELEASE_TABLET | Freq: Every day | ORAL | Status: DC
  Administered 2015-03-30 – 2015-03-31 (×2): 40 mg via ORAL
  Filled 2015-03-29 (×2): qty 1

## 2015-03-29 MED ORDER — ALBUTEROL SULFATE (2.5 MG/3ML) 0.083% IN NEBU
INHALATION_SOLUTION | RESPIRATORY_TRACT | Status: AC
  Administered 2015-03-29: 5 mg via RESPIRATORY_TRACT
  Filled 2015-03-29: qty 6

## 2015-03-29 MED ORDER — SENNOSIDES-DOCUSATE SODIUM 8.6-50 MG PO TABS: 1.0000 | ORAL_TABLET | Freq: Two times a day (BID) | ORAL | Status: DC | PRN

## 2015-03-29 MED ORDER — DIPHENHYDRAMINE HCL 25 MG PO CAPS: 25.0000 mg | ORAL_CAPSULE | Freq: Every evening | ORAL | Status: DC | PRN

## 2015-03-29 MED ORDER — INFLUENZA VAC SPLIT QUAD 0.5 ML IM SUSY
0.5000 mL | PREFILLED_SYRINGE | INTRAMUSCULAR | Status: AC
  Administered 2015-03-30: 0.5 mL via INTRAMUSCULAR
  Filled 2015-03-29: qty 0.5

## 2015-03-29 NOTE — Progress Notes (Signed)
Patient active with [facility] skilled nursing services. Will need resumption orders at discharge.

## 2015-03-29 NOTE — ED Provider Notes (Signed)
[facility] Emergency Department Provider Note  Time seen: 9:57 AM  I have reviewed the triage vital signs and the nursing notes.   HISTORY  Chief Complaint Shortness of Breath    HPI [name] is a 64 y.o. female with a past medical history of asthma, COPD (wears O2 at night), hypertension, CVA, diabetes, deaf, presents to the emergency department with shortness of breath, chest tightness, and cough. According to the patient she has had a cough with shortness breath for the past 4 days. Patient saw her primary care physician who started her on a Z-Pak as well as prednisone. Patient states she has been taking these medications without any relief. Also states mild chest tightness. Patient has been coughing with sputum production. Denies fever. Describes her shortness breath is moderate. Chest tightness is mild. No modifying factors identified.    Past Medical History  Diagnosis Date  . Asthma   . COPD (chronic obstructive pulmonary disease)   . On home oxygen therapy     hs  . Sleep apnea   . Shortness of breath dyspnea   . Hypertension   . Heart murmur   . Seizures   . Neuropathy   . Stroke     tia  . Cirrhosis, non-alcoholic   . Hepatitis   . GERD (gastroesophageal reflux disease)   . Deaf   . Depression   . Diabetes mellitus without complication   . RLS (restless legs syndrome)   . Lymph node disorder     arm  . Orthopnea     There are no active problems to display for this patient.   Past Surgical History  Procedure Laterality Date  . Thumb arthroscopy    . Tonsillectomy    . Cesarean section    . Cholecystectomy    . Tympanoplasty      muliple  . Knee arthroplasty    . Cataract extraction w/phaco Right 11/23/2014    Procedure: CATARACT EXTRACTION PHACO AND INTRAOCULAR LENS PLACEMENT (IOC);  Surgeon: Lyla Glassing, MD;  Location: [facility] ORS;  Service: Ophthalmology;  Laterality: Right;  . Cataract extraction w/phaco Left  12/14/2014    Procedure: CATARACT EXTRACTION PHACO AND INTRAOCULAR LENS PLACEMENT (IOC);  Surgeon: Lyla Glassing, MD;  Location: [facility] ORS;  Service: Ophthalmology;  Laterality: Left;  US:01:16.6 AP:15.8 CDE:12.14    Current Outpatient Rx  Name  Route  Sig  Dispense  Refill  . albuterol (PROVENTIL HFA;VENTOLIN HFA) 108 (90 BASE) MCG/ACT inhaler   Inhalation   Inhale 2 puffs into the lungs every 4 (four) hours as needed for wheezing or shortness of breath.         Marland Kitchen albuterol (PROVENTIL) (2.5 MG/3ML) 0.083% nebulizer solution   Nebulization   Take 2.5 mg by nebulization.         Marland Kitchen amLODipine (NORVASC) 10 MG tablet   Oral   Take 10 mg by mouth daily.         . clopidogrel (PLAVIX) 75 MG tablet   Oral   Take 75 mg by mouth daily.         . diphenhydrAMINE (SOMINEX) 25 MG tablet   Oral   Take 25 mg by mouth at bedtime as needed for sleep.         . enalapril (VASOTEC) 20 MG tablet   Oral   Take 20 mg by mouth daily.         Marland Kitchen EPINEPHrine 0.3 mg/0.3 mL IJ SOAJ injection   Intramuscular  Inject into the muscle once.         . Fluticasone-Salmeterol (ADVAIR) 500-50 MCG/DOSE AEPB   Inhalation   Inhale 1 puff into the lungs 2 (two) times daily.         Marland Kitchen glipiZIDE (GLUCOTROL XL) 10 MG 24 hr tablet   Oral   Take 10 mg by mouth daily with breakfast.         . hydrochlorothiazide (HYDRODIURIL) 25 MG tablet   Oral   Take 25 mg by mouth daily.         . naproxen (NAPROSYN) 500 MG tablet   Oral   Take 500 mg by mouth 2 (two) times daily with a meal.         . omeprazole (PRILOSEC) 20 MG capsule   Oral   Take 20 mg by mouth daily.         . potassium chloride SA (K-DUR,KLOR-CON) 20 MEQ tablet   Oral   Take 20 mEq by mouth 2 (two) times daily.         Marland Kitchen rOPINIRole (REQUIP) 0.25 MG tablet   Oral   Take 0.25 mg by mouth 3 (three) times daily.           Allergies Aspirin; Celebrex; Ciprofloxacin; Codeine; Fosphenytoin; Levaquin; Penicillins;  and Sulfa antibiotics  No family history on file.  Social History Social History  Substance Use Topics  . Smoking status: Former Research scientist (life sciences)  . Smokeless tobacco: None  . Alcohol Use: No    Review of Systems Constitutional: Negative for fever. ENT: Negative for congestion Cardiovascular: Positive for chest tightness. Respiratory: Positive shortness of breath. Gastrointestinal: Negative for abdominal pain, vomiting and diarrhea. Neurological: Negative for headache 10-point ROS otherwise negative.  ____________________________________________   PHYSICAL EXAM:  VITAL SIGNS: ED Triage Vitals  Enc Vitals Group     BP 03/29/15 0938 145/57 mmHg     Pulse Rate 03/29/15 0937 70     Resp 03/29/15 0937 22     Temp 03/29/15 0938 97.7 F (36.5 C)     Temp Source 03/29/15 0938 Oral     SpO2 03/29/15 0937 89 %     Weight 03/29/15 0938 170 lb (77.111 kg)     Height 03/29/15 0938 _0  (1.6 m)     Head Cir --      Peak Flow --      Pain Score --      Pain Loc --      Pain Edu? --      Excl. in [place]? --     Constitutional: Alert and oriented. Well appearing and in no distress. Eyes: Normal exam ENT   Mouth/Throat: Mucous membranes are moist. Cardiovascular: Normal rate, regular rhythm. No murmur Respiratory: Mild tachypnea, diffuse expiratory wheeze on exam. No rales or rhonchi. No distress. Gastrointestinal: Soft and nontender. No distention. Musculoskeletal: Nontender with normal range of motion in all extremities. Neurologic:  No gross deficits. Patient uses sign language to communicate, with daughter interpreting. Skin:  Skin is warm, dry and intact.  Psychiatric: Mood and affect are normal. Speech and behavior are normal. ____________________________________________    EKG  EKG reviewed and interpreted by myself shows normal sinus rhythm at 64 bpm, narrow QRS, normal axis, normal intervals, no ST changes noted.  ____________________________________________     RADIOLOGY  No acute findings on chest x-ray  ____________________________________________   INITIAL IMPRESSION / ASSESSMENT AND PLAN / ED COURSE  Pertinent labs & imaging results that were available during my  care of the patient were reviewed by me and considered in my medical decision making (see chart for details).  Patient with shortness of breath and cough for the past 4 days. Currently on prednisone and Zithromax. We will check labs, chest x-ray, treat with DuoNeb's and Solu-Medrol, as we do not know the dose of prednisone given.  Labs are largely within normal limits, chest x-ray is clear. Patient continues to sat in the low 80s on room air, mid to upper 80s on her 2 L of oxygen, but is requiring 4 L of oxygen to maintain an oxygen saturation greater than 92%. We will admit to the hospital for a COPD exacerbation.  ____________________________________________   FINAL CLINICAL IMPRESSION(S) / ED DIAGNOSES  COPD exacerbation   Harvest Dark, MD 03/29/15 1101

## 2015-03-29 NOTE — H&P (Signed)
[facility] [place] NAME: [name]    MR#:  [id number]  DATE OF BIRTH:  02-May-1951  DATE OF ADMISSION:  03/29/2015  PRIMARY CARE PHYSICIAN: Donnie Coffin, MD   REQUESTING/REFERRING PHYSICIAN: Harvest Dark  CHIEF COMPLAINT:   Chief Complaint  Patient presents with  . Shortness of Breath    HISTORY OF PRESENT ILLNESS:  [name]  is a 64 y.o. female with multiple medical problems including COPD. She's been having shortness of breath that began gradually worsening since Monday. On Monday she called into her 31 office and got a Z-Pak and prednisone. It's made no difference with her breathing. Of note, her husband recently got diagnosed with liver cancer and she started smoking again up to 8 cigarettes a day. Patient's breathing got worse last night and came into the ER for further evaluation. She's been wheezing, and coughing up yellow phlegm. She is also having chest pain with breathing.  PAST MEDICAL HISTORY:   Past Medical History  Diagnosis Date  . Asthma   . COPD (chronic obstructive pulmonary disease)   . On home oxygen therapy     hs  . Sleep apnea   . Shortness of breath dyspnea   . Hypertension   . Heart murmur   . Seizures   . Neuropathy   . Stroke     tia  . Cirrhosis, non-alcoholic   . Hepatitis   . GERD (gastroesophageal reflux disease)   . Deaf   . Depression   . Diabetes mellitus without complication   . RLS (restless legs syndrome)   . Lymph node disorder     arm  . Orthopnea     PAST SURGICAL HISTORY:   Past Surgical History  Procedure Laterality Date  . Thumb arthroscopy    . Tonsillectomy    . Cesarean section    . Cholecystectomy    . Tympanoplasty      muliple  . Knee arthroplasty    . Cataract extraction w/phaco Right 11/23/2014    Procedure: CATARACT EXTRACTION PHACO AND INTRAOCULAR LENS PLACEMENT (IOC);  Surgeon: Karen Glassing, MD;  Location: [facility] ORS;   Service: Ophthalmology;  Laterality: Right;  . Cataract extraction w/phaco Left 12/14/2014    Procedure: CATARACT EXTRACTION PHACO AND INTRAOCULAR LENS PLACEMENT (IOC);  Surgeon: Karen Glassing, MD;  Location: [facility] ORS;  Service: Ophthalmology;  Laterality: Left;  US:01:16.6 AP:15.8 CDE:12.14    SOCIAL HISTORY:   Social History  Substance Use Topics  . Smoking status: Current Every Day Smoker -- 0.50 packs/day  . Smokeless tobacco: Not on file  . Alcohol Use: No    FAMILY HISTORY:   Family History  Problem Relation Age of Onset  . Lung cancer Mother   . CAD Father     DRUG ALLERGIES:   Allergies  Allergen Reactions  . Aspirin   . Celebrex [Celecoxib]   . Ciprofloxacin   . Codeine   . Fosphenytoin   . Levaquin [Levofloxacin In D5w]   . Penicillins   . Sulfa Antibiotics     REVIEW OF SYSTEMS:  CONSTITUTIONAL: No fever, positive for cold chills, positive for fatigue. EYES: No blurred or double vision. Wears glasses for reading and driving EARS, NOSE, AND THROAT: Patient is deaf but does read lips No tinnitus or ear pain. Previous sore throat but that is gone now. Positive for runny nose. RESPIRATORY: Positive for cough with yellow phlegm, positive for shortness of breath, positive for wheezing.  No hemoptysis.  CARDIOVASCULAR: Positive for chest pain, no orthopnea, edema.  GASTROINTESTINAL: No nausea, vomiting, or abdominal pain. No blood in bowel movements. Some diarrhea GENITOURINARY: No dysuria, hematuria.  ENDOCRINE: No polyuria, nocturia,  HEMATOLOGY: No anemia, easy bruising or bleeding SKIN: No rash or lesion. MUSCULOSKELETAL: No joint pain or arthritis.   NEUROLOGIC: No tingling, numbness, weakness.  PSYCHIATRY: No anxiety or depression.   MEDICATIONS AT HOME:   Prior to Admission medications   Medication Sig Start Date End Date Taking? Authorizing Provider  albuterol (PROVENTIL HFA;VENTOLIN HFA) 108 (90 BASE) MCG/ACT inhaler Inhale 2 puffs into the lungs  every 4 (four) hours as needed for wheezing or shortness of breath.   Yes Historical Provider, MD  albuterol (PROVENTIL) (2.5 MG/3ML) 0.083% nebulizer solution Take 2.5 mg by nebulization.   Yes Historical Provider, MD  amLODipine (NORVASC) 10 MG tablet Take 10 mg by mouth daily.   Yes Historical Provider, MD  azithromycin (ZITHROMAX) 250 MG tablet Take 250 mg by mouth daily.   Yes Historical Provider, MD  carboxymethylcellulose (REFRESH TEARS) 0.5 % SOLN Place 1 drop into both eyes daily as needed.   Yes Historical Provider, MD  clopidogrel (PLAVIX) 75 MG tablet Take 75 mg by mouth daily.   Yes Historical Provider, MD  diphenhydrAMINE (SOMINEX) 25 MG tablet Take 25 mg by mouth at bedtime as needed for sleep.   Yes Historical Provider, MD  enalapril (VASOTEC) 20 MG tablet Take 20 mg by mouth daily.   Yes Historical Provider, MD  EPINEPHrine 0.3 mg/0.3 mL IJ SOAJ injection Inject into the muscle once.   Yes Historical Provider, MD  Fluticasone-Salmeterol (ADVAIR) 500-50 MCG/DOSE AEPB Inhale 1 puff into the lungs 2 (two) times daily.   Yes Historical Provider, MD  glipiZIDE (GLUCOTROL XL) 10 MG 24 hr tablet Take 10 mg by mouth daily with breakfast.   Yes Historical Provider, MD  hydrochlorothiazide (HYDRODIURIL) 25 MG tablet Take 25 mg by mouth daily.   Yes Historical Provider, MD  naproxen (NAPROSYN) 500 MG tablet Take 500 mg by mouth 2 (two) times daily with a meal.   Yes Historical Provider, MD  nepafenac (ILEVRO) 0.3 % ophthalmic suspension Place 1 drop into the left eye daily.   Yes Historical Provider, MD  omeprazole (PRILOSEC) 20 MG capsule Take 20 mg by mouth daily.   Yes Historical Provider, MD  potassium chloride SA (K-DUR,KLOR-CON) 20 MEQ tablet Take 20 mEq by mouth 2 (two) times daily.   Yes Historical Provider, MD  predniSONE (DELTASONE) 10 MG tablet Take 10 mg by mouth daily with breakfast. 4 tablets for 2 days, 3 tablets for 2 days, 2 tablets for 2 days and 1 tablet for 2 days.   Yes  Historical Provider, MD  pregabalin (LYRICA) 150 MG capsule Take 150 mg by mouth at bedtime.   Yes Historical Provider, MD  sennosides-docusate sodium (SENOKOT-S) 8.6-50 MG tablet Take 1 tablet by mouth 2 (two) times daily as needed for constipation.   Yes Historical Provider, MD      VITAL SIGNS:  Blood pressure 137/63, pulse 62, temperature 98.1 F (36.7 C), temperature source Oral, resp. rate 20, height _0  (1.6 m), weight 77.111 kg (170 lb), SpO2 93 %.  PHYSICAL EXAMINATION:  GENERAL:  64 y.o.-year-old patient lying in the bed with no acute distress.  EYES: Pupils equal, round, reactive to light and accommodation. No scleral icterus. Extraocular muscles intact.  HEENT: Head atraumatic, normocephalic. Oropharynx and nasopharynx clear.  NECK:  Supple, no jugular  venous distention. No thyroid enlargement, no tenderness.  LUNGS: Decreased breath sounds bilaterally, positive wheezing throughout entire lung field, no rales,rhonchi or crepitation. No use of accessory muscles of respiration.  CARDIOVASCULAR: S1, S2 normal. No murmurs, rubs, or gallops.  ABDOMEN: Soft, nontender, nondistended. Bowel sounds present. No organomegaly or mass.  EXTREMITIES: Trace edema, no cyanosis on oxygen.  NEUROLOGIC: Cranial nerves II through XII are intact. Muscle strength 5/5 in all extremities. Sensation intact. Gait not checked.  PSYCHIATRIC: The patient is alert and oriented x 3.  SKIN: No rash, lesion, or ulcer.   LABORATORY PANEL:   CBC  Recent Labs Lab 03/29/15 0947  WBC 8.1  HGB 15.9  HCT 47.9*  PLT 268   ------------------------------------------------------------------------------------------------------------------  Chemistries   Recent Labs Lab 03/29/15 0947  NA 134*  K 3.2*  CL 98*  CO2 27  GLUCOSE 249*  BUN 12  CREATININE 0.55  CALCIUM 8.9   ------------------------------------------------------------------------------------------------------------------  Cardiac  Enzymes  Recent Labs Lab 03/29/15 0947  TROPONINI <0.03   ------------------------------------------------------------------------------------------------------------------  RADIOLOGY:  Dg Chest 2 View  03/29/2015   CLINICAL DATA:  Short of breath 1 week.  History of COPD and asthma.  EXAM: CHEST  2 VIEW  COMPARISON:  CT 11/05/2013  FINDINGS: Normal cardiac silhouette. Lungs are hyperinflated. No effusion, infiltrate, or pneumothorax. Degenerative osteophytosis of the thoracic spine.  IMPRESSION: Hyperinflated lungs without acute findings.   Electronically Signed   By: Suzy Bouchard M.D.   On: 03/29/2015 10:44    EKG:   Normal sinus rhythm 64 bpm  IMPRESSION AND PLAN:   1. Acute respiratory failure with hypoxia. Pulse ox 89% on room air at rest. I will give oxygen supplementation. 2. COPD exacerbation- I will start high-dose Solu-Medrol 60 mg every 6 hours, budesonide nebulizer, DuoNeb nebulizer. I will switch Zithromax over to doxycycline. With multiple drug allergies, we are limited with antibiotics. 3. Type 2 diabetes mellitus- sugars will be high while on steroids we'll put on sliding scale 4. Sleep apnea obesity -patient wears oxygen at night. 5. Essential hypertension- continue usual medications 6. History of CVA- on Plavix, needed up to PMD about statin. 7. [place] 8. Patient is deaf but she reads lips very well. Daughter was in the room with me and was able to do sign language so the patient can communicate back with me. 9. History of seizure disorder 10. Osteo-and rheumatoid arthritis 11. Restless leg syndrome 12. Gastroesophageal reflux disease without esophagitis on PPI at home will continue while here. 13. Tobacco abuse-smoking cessation counseling done 3 minutes by me. Nicotrol inhaler when necessary.  All the records are reviewed and case discussed with ED provider. Management plans discussed with the patient, family and they are in agreement.  CODE STATUS: Patient  is a DO NOT INTUBATE.  TOTAL TIME TAKING CARE OF THIS PATIENT: 50  minutes.    Loletha Grayer M.D on 03/29/2015 at 11:37 AM  Between 7am to 6pm - Pager - [phone]  After 6pm call admission pager [facility]  Office  [phone]  CC: Primary care physician; Donnie Coffin, MD

## 2015-03-29 NOTE — ED Notes (Signed)
Pt reports that she began having sob on Monday and her home health nurse called and had an antibiotic prescribed as well as prednisone since Monday (thinks it may be zithro, but not sure) with no improvement. Pt is alert & oriented. Pt is deaf but reads lips well and is able to communicate. Pt requests to not use computer for communication. Pt has hx of copd and asthma, and is smoker.

## 2015-03-29 NOTE — Progress Notes (Signed)
Date: 03/29/2015,   MRN# [id number] [name] [birth year] Code Status:     Code Status Orders        Start     Ordered   03/29/15 1130  Limited resuscitation (code)   Continuous    Question Answer Comment  In the event of cardiac or respiratory ARREST: Initiate Code Blue, Call Rapid Response Yes   In the event of cardiac or respiratory ARREST: Perform CPR Yes   In the event of cardiac or respiratory ARREST: Perform Intubation/Mechanical Ventilation No   In the event of cardiac or respiratory ARREST: Use NIPPV/BiPAp only if indicated Yes   In the event of cardiac or respiratory ARREST: Administer ACLS medications if indicated Yes   In the event of cardiac or respiratory ARREST: Perform Defibrillation or Cardioversion if indicated Yes      03/29/15 1130    Advance Directive Documentation        Most Recent Value   Type of Advance Directive  Living will   Pre-existing out of facility DNR order (yellow form or pink MOST form)     "MOST" Form in Place?       Hosp day:_0 @ Referring MD: _1 @         AdmissionWeight: 170 lb (77.111 kg)                 CurrentWeight: 170 lb (77.111 kg)  CC: copd/asthma flare  HPI: This is a 64 year old lady came to [place] with wheezing, cough, non bloody sputum production and worsening son x 4 days. She had been on zpak and prednisone taper. Sadly her husband was recently diagnosed with terminal liver cancer and to cope she resumed smoking cigarettes ( 8 cig/day). Presently she is improved since admission. Less sob, wheezing and coughing. No chest pain, leg pain, swelling, hemoptysis, ectopy, syncope, recent travel or fever.     PMHX:   Past Medical History  Diagnosis Date  . Asthma   . COPD (chronic obstructive pulmonary disease)   . On home oxygen therapy     hs  . Sleep apnea   . Shortness of breath dyspnea   . Hypertension   . Heart murmur   . Seizures   . Neuropathy   . Stroke     tia  . Cirrhosis, non-alcoholic    . Hepatitis   . GERD (gastroesophageal reflux disease)   . Deaf   . Depression   . Diabetes mellitus without complication   . RLS (restless legs syndrome)   . Lymph node disorder     arm  . Orthopnea    Surgical Hx:  Past Surgical History  Procedure Laterality Date  . Thumb arthroscopy    . Tonsillectomy    . Cesarean section    . Cholecystectomy    . Tympanoplasty      muliple  . Knee arthroplasty    . Cataract extraction w/phaco Right 11/23/2014    Procedure: CATARACT EXTRACTION PHACO AND INTRAOCULAR LENS PLACEMENT (IOC);  Surgeon: Lyla Glassing, MD;  Location: [facility] ORS;  Service: Ophthalmology;  Laterality: Right;  . Cataract extraction w/phaco Left 12/14/2014    Procedure: CATARACT EXTRACTION PHACO AND INTRAOCULAR LENS PLACEMENT (IOC);  Surgeon: Lyla Glassing, MD;  Location: [facility] ORS;  Service: Ophthalmology;  Laterality: Left;  US:01:16.6 AP:15.8 CDE:12.14   Family Hx:  Family History  Problem Relation Age of Onset  . Lung cancer Mother   . CAD Father    Social Hx:   Social History  Substance Use Topics  . Smoking status: Current Every Day Smoker -- 0.50 packs/day  . Smokeless tobacco: None  . Alcohol Use: No   Medication:    Home Medication:  No current outpatient prescriptions on file.  Current Medication: _0 @   Allergies:  Aspirin; Celebrex; Ciprofloxacin; Codeine; Fosphenytoin; Levaquin; Penicillins; and Sulfa antibiotics  Review of Systems: Gen:  Denies  fever, sweats, chills HEENT: Denies blurred vision, double vision, ear pain, eye pain, hearing loss, nose bleeds, sore throat Cvc:  No dizziness, chest pain or heaviness Resp:  Less sob, no pleurisy, mild cough, no bloody sputum  Gi: Denies swallowing difficulty, stomach pain, nausea or vomiting, diarrhea, constipation, bowel incontinence Gu:  Denies bladder incontinence, burning urine Ext:   No Joint pain, stiffness or swelling Skin: No skin rash, easy bruising or bleeding or  hives Endoc:  No polyuria, polydipsia , polyphagia or weight change Psych: No depression, insomnia or hallucinations  Other:  All other systems negative  Physical Examination:   VS: BP 136/48 mmHg  Pulse 68  Temp(Src) 97.5 F (36.4 C) (Oral)  Resp 22  Ht _1  (1.6 m)  Wt 170 lb (77.111 kg)  BMI 30.12 kg/m2  SpO2 93%  General Appearance: No distress  Neuro/Psych: without focal findings, mental status, speech normal, alert and oriented, cranial nerves 2-12 intact, reflexes normal and symmetric, sensation grossly normal  HEENT: PERRLA, EOM intact, no ptosis, no other lesions noticed NECK: Supple, no jvd, no stridor, no thyromegaly, trachea midlined  Pulmonary:.No wheezing, No rales  Sputum Production:   Cardiovascular:  Normal S1,S2.  No m/r/g.  Abdominal aorta pulsation normal.    Abdomen:Benign, Soft, non-tender, No masses, hepatosplenomegaly, No lymphadenopathy Endoc: No evident thyromegaly, no signs of acromegaly or Cushing features Skin:   warm, no rashes, no ecchymosis  Extremities: normal, no cyanosis, clubbing, no edema, warm with normal capillary refill.    Labs results:   Recent Labs     03/29/15  0947  HGB  15.9  HCT  47.9*  MCV  84.4  WBC  8.1  BUN  12  CREATININE  0.55  GLUCOSE  249*  CALCIUM  8.9  ,  Rad results:   Dg Chest 2 View  03/29/2015   CLINICAL DATA:  Short of breath 1 week.  History of COPD and asthma.  EXAM: CHEST  2 VIEW  COMPARISON:  CT 11/05/2013  FINDINGS: Normal cardiac silhouette. Lungs are hyperinflated. No effusion, infiltrate, or pneumothorax. Degenerative osteophytosis of the thoracic spine.  IMPRESSION: Hyperinflated lungs without acute findings.   Electronically Signed   By: Suzy Bouchard M.D.   On: 03/29/2015 10:44     Assessment and Plan: COPD (chronic obstructive pulmonary disease), here with a copd flare, improved since admission   Nocturnal hypoxia, on oxygen at home    Obesity    Elevated RSVP, no signs of  advancing pulmonary hypertension. Following Restless leg syndrome, stable  Nicotine addiction Reactive depression  Plan Resume pre admit meds Solumedrol  Emperic antibiotic Wean fio2 as tolerated DVT prophylaxis Serial echo's (out patient) Weight loss Stop smoking Reassess in the am   I have personally obtained a history, examined the patient, evaluated laboratory and imaging results, formulated the assessment and plan and placed orders.  The Patient requires high complexity decision making for assessment and support, frequent evaluation and titration of therapies, application of advanced monitoring technologies and extensive interpretation of multiple databases.   Herbon Fleming,M.D. Pulmonary & Critical care Medicine [name]

## 2015-03-29 NOTE — ED Notes (Signed)
Pt placed on 2L to keep O2 sat above 92%.

## 2015-03-29 NOTE — ED Notes (Signed)
Patient to ER for c/o shortness of breath x1 week, worsened in last couple of days. Patient moderately working to breathe. Patient has h/o COPD and Asthma, wears O2 at night. Has been wearing O2 intermittently during day d/t shortness of breath.

## 2015-03-30 LAB — CBC
HEMATOCRIT: 44.3 % (ref 35.0–47.0)
Hemoglobin: 15.2 g/dL (ref 12.0–16.0)
MCH: 29.1 pg (ref 26.0–34.0)
MCHC: 34.3 g/dL (ref 32.0–36.0)
MCV: 84.9 fL (ref 80.0–100.0)
PLATELETS: 237 10*3/uL (ref 150–440)
RBC: 5.22 MIL/uL — ABNORMAL HIGH (ref 3.80–5.20)
RDW: 13.8 % (ref 11.5–14.5)
WBC: 7.5 10*3/uL (ref 3.6–11.0)

## 2015-03-30 LAB — BASIC METABOLIC PANEL
Anion gap: 9 (ref 5–15)
BUN: 19 mg/dL (ref 6–20)
CHLORIDE: 101 mmol/L (ref 101–111)
CO2: 26 mmol/L (ref 22–32)
CREATININE: 0.44 mg/dL (ref 0.44–1.00)
Calcium: 9.2 mg/dL (ref 8.9–10.3)
GFR calc Af Amer: >60 mL/min (ref >60)
GFR calc non Af Amer: >60 mL/min (ref >60)
GLUCOSE: 185 mg/dL — AB (ref 65–99)
POTASSIUM: 4.1 mmol/L (ref 3.5–5.1)
SODIUM: 136 mmol/L (ref 135–145)

## 2015-03-30 LAB — GLUCOSE, CAPILLARY
GLUCOSE-CAPILLARY: 227 mg/dL — AB (ref 65–99)
GLUCOSE-CAPILLARY: 232 mg/dL — AB (ref 65–99)
Glucose-Capillary: 188 mg/dL — ABNORMAL HIGH (ref 65–99)
Glucose-Capillary: 247 mg/dL — ABNORMAL HIGH (ref 65–99)

## 2015-03-30 MED ORDER — NEPAFENAC 0.1 % OP SUSP
1.0000 [drp] | Freq: Every day | OPHTHALMIC | Status: DC
  Administered 2015-03-30 – 2015-03-31 (×2): 1 [drp] via OPHTHALMIC
  Filled 2015-03-30: qty 3

## 2015-03-30 MED ORDER — INSULIN ASPART 100 UNIT/ML ~~LOC~~ SOLN
0.0000 [IU] | Freq: Every day | SUBCUTANEOUS | Status: DC
  Administered 2015-03-30: 2 [IU] via SUBCUTANEOUS
  Filled 2015-03-30: qty 2

## 2015-03-30 MED ORDER — INSULIN ASPART 100 UNIT/ML ~~LOC~~ SOLN
0.0000 [IU] | Freq: Three times a day (TID) | SUBCUTANEOUS | Status: DC
  Administered 2015-03-30: 7 [IU] via SUBCUTANEOUS
  Administered 2015-03-31: 4 [IU] via SUBCUTANEOUS
  Filled 2015-03-30: qty 4
  Filled 2015-03-30: qty 7

## 2015-03-30 NOTE — Progress Notes (Signed)
Date: 03/30/2015,   MRN# [id number] [name] 1951/01/29 Code Status:     Code Status Orders        Start     Ordered   03/29/15 1130  Limited resuscitation (code)   Continuous    Question Answer Comment  In the event of cardiac or respiratory ARREST: Initiate Code Blue, Call Rapid Response Yes   In the event of cardiac or respiratory ARREST: Perform CPR Yes   In the event of cardiac or respiratory ARREST: Perform Intubation/Mechanical Ventilation No   In the event of cardiac or respiratory ARREST: Use NIPPV/BiPAp only if indicated Yes   In the event of cardiac or respiratory ARREST: Administer ACLS medications if indicated Yes   In the event of cardiac or respiratory ARREST: Perform Defibrillation or Cardioversion if indicated Yes      03/29/15 1130    Advance Directive Documentation        Most Recent Value   Type of Advance Directive  Living will   Pre-existing out of facility DNR order (yellow form or pink MOST form)     "MOST" Form in Place?       Hosp day:_0 @ Referring MD: _1 @       HPI: SHE FEELS LESS SOB, MINIMUM WHEEZING  PMHX:   Past Medical History  Diagnosis Date  . Asthma   . COPD (chronic obstructive pulmonary disease)   . On home oxygen therapy     hs  . Sleep apnea   . Shortness of breath dyspnea   . Hypertension   . Heart murmur   . Seizures   . Neuropathy   . Stroke     tia  . Cirrhosis, non-alcoholic   . Hepatitis   . GERD (gastroesophageal reflux disease)   . Deaf   . Depression   . Diabetes mellitus without complication   . RLS (restless legs syndrome)   . Lymph node disorder     arm  . Orthopnea    Surgical Hx:  Past Surgical History  Procedure Laterality Date  . Thumb arthroscopy    . Tonsillectomy    . Cesarean section    . Cholecystectomy    . Tympanoplasty      muliple  . Knee arthroplasty    . Cataract extraction w/phaco Right 11/23/2014    Procedure: CATARACT EXTRACTION PHACO AND INTRAOCULAR LENS  PLACEMENT (IOC);  Surgeon: Lyla Glassing, MD;  Location: [facility] ORS;  Service: Ophthalmology;  Laterality: Right;  . Cataract extraction w/phaco Left 12/14/2014    Procedure: CATARACT EXTRACTION PHACO AND INTRAOCULAR LENS PLACEMENT (IOC);  Surgeon: Lyla Glassing, MD;  Location: [facility] ORS;  Service: Ophthalmology;  Laterality: Left;  US:01:16.6 AP:15.8 CDE:12.14   Family Hx:  Family History  Problem Relation Age of Onset  . Lung cancer Mother   . CAD Father    Social Hx:   Social History  Substance Use Topics  . Smoking status: Current Every Day Smoker -- 0.50 packs/day  . Smokeless tobacco: None  . Alcohol Use: No   Medication:    Home Medication:  No current outpatient prescriptions on file.  Current Medication: _2 @   Allergies:  Aspirin; Celebrex; Ciprofloxacin; Codeine; Fosphenytoin; Levaquin; Penicillins; and Sulfa antibiotics  Review of Systems: Gen:  Denies  fever, sweats, chills HEENT: Denies blurred vision, double vision, ear pain, eye pain, hearing loss, nose bleeds, sore throat Cvc:  No dizziness, chest pain or heaviness Resp:  No cough, minimum wheeze, sob much better  Gi: Denies  swallowing difficulty, stomach pain, nausea or vomiting, diarrhea, constipation, bowel incontinence Gu:  Denies bladder incontinence, burning urine Ext:   No Joint pain, stiffness or swelling Skin: No skin rash, easy bruising or bleeding or hives Endoc:  No polyuria, polydipsia , polyphagia or weight change Psych: No depression, insomnia or hallucinations  Other:  All other systems negative  Physical Examination:   VS: BP 130/46 mmHg  Pulse 73  Temp(Src) 98.2 F (36.8 C) (Oral)  Resp 16  Ht 5' 3" (1.6 m)  Wt 170 lb (77.111 kg)  BMI 30.12 kg/m2  SpO2 94%  General Appearance: No distress  Neuro/psych: without focal findings, mental status, speech normal, alert and oriented, cranial nerves 2-12 intact, reflexes normal and symmetric, sensation grossly normal  HEENT:  PERRLA, EOM intact, no ptosis, no other lesions noticed, Mallampati: Pulmonary:.rare heezing, No rales    Cardiovascular:  Normal S1,S2.  sysyt murmur present.    Abdomen:Benign, Soft, non-tender, No masses, hepatosplenomegaly, No lymphadenopathy Endoc: No evident thyromegaly, no signs of acromegaly or Cushing features Skin:   warm, no rashes, no ecchymosis  Extremities: normal, no cyanosis, clubbing, no edema, warm with normal capillary refill. :   Labs results:   Recent Labs     03/29/15  0947  03/30/15  0439  HGB  15.9  15.2  HCT  47.9*  44.3  MCV  84.4  84.9  WBC  8.1  7.5  BUN  12  19  CREATININE  0.55  0.44  GLUCOSE  249*  185*  CALCIUM  8.9  9.2  ,    Rad results:   No results found.    Assessment and Plan: COPD (chronic obstructive pulmonary disease), here with a copd flare, continues to improve since admission   Nocturnal hypoxia, on oxygen at home    Obesity    Elevated RSVP, no signs of advancing pulmonary hypertension. Following Restless leg syndrome, stable  Nicotine addiction Reactive depression  Plan Resume pre admit meds Solumedrol to pred taper Emperic antibiotic x 7 fays Wean fio2 as tolerated DVT prophylaxis Serial echo's (out patient) Weight loss Stop smoking Suspect will be ready to go home tomorrow Follow up with pulmonary in 1-2 weeks   I have personally obtained a history, examined the patient, evaluated laboratory and imaging results, formulated the assessment and plan and placed orders.  The Patient requires high complexity decision making for assessment and support, frequent evaluation and titration of therapies, application of advanced monitoring technologies and extensive interpretation of multiple databases.   Herbon Fleming,M.D. Pulmonary & Critical care Medicine [facility]

## 2015-03-30 NOTE — Progress Notes (Signed)
Inpatient Diabetes Program Recommendations  AACE/ADA: New Consensus Statement on Inpatient Glycemic Control (2015)  Target Ranges:  Prepandial:   less than 140 mg/dL      Peak postprandial:   less than 180 mg/dL (1-2 hours)      Critically ill patients:  140 - 180 mg/dL   Review of Glycemic Control  Diabetes history: Type 2, A1C 7.2% Outpatient Diabetes medications: Gli[pizide 10m qday Current orders for Inpatient glycemic control: Glipizide 162mqday, Novolog 0-9 units tid, Novolog 0-5 units qhs  Inpatient Diabetes Program Recommendations:  Consider changing Novolog correction to 0-20 units (resistant scale) since the patient is on steroids.    Insulin correction dose will need to decrease/be discontinued once the steroids are tapered and discontinued.  JuGentry FitzRN, BA, MHA, CDE Diabetes Coordinator Inpatient Diabetes Program  [phone] Pager) [phone]/23/2016 9:41 AM

## 2015-03-30 NOTE — Care Management (Signed)
Spoke with patient for discharge planning, patient is deaf but is able to read lips. Offered to have sign interpreter come to the room but patient communicated that she could understand me just fine.  Patient stated that her PCP is Dr Clide Deutscher and that she has services from [facility] for help with her medications.. States that she lives with her husband. Patient drives self and is independent, uses a walker. Has home O2 with [facility].  Denies difficulty with medications. No CM needs identified.

## 2015-03-30 NOTE — Progress Notes (Signed)
Initial Nutrition Assessment    INTERVENTION:   Meals and Snacks: Cater to patient preferences   NUTRITION DIAGNOSIS:   No nutrition diagnosis at this time  GOAL:   Patient will meet greater than or equal to 90% of their needs   MONITOR:    (Energy Intake, Anthropometrics, Digestive System, Electrolyte/Renal Profile)  REASON FOR ASSESSMENT:   Malnutrition Screening Tool    ASSESSMENT:    Pt admitted with acute respiratory failure with COPD exacerbation. Pt is deaf, reads lips very well  Past Medical History  Diagnosis Date  . Asthma   . COPD (chronic obstructive pulmonary disease)   . On home oxygen therapy     hs  . Sleep apnea   . Shortness of breath dyspnea   . Hypertension   . Heart murmur   . Seizures   . Neuropathy   . Stroke     tia  . Cirrhosis, non-alcoholic   . Hepatitis   . GERD (gastroesophageal reflux disease)   . Deaf   . Depression   . Diabetes mellitus without complication   . RLS (restless legs syndrome)   . Lymph node disorder     arm  . Orthopnea      Diet Order:  Diet heart healthy/carb modified Room service appropriate?: Yes; Fluid consistency:: Thin   Energy Intake: recorded po intake 100% at dinner; pt reports she ate a good breakfast, 50% at lunch. Requesting ice cream on visit today  Food and Nutrition Related History: per MST, reduced appetite prior to admission  Last BM:  9/21   Electrolyte and Renal Profile:  Recent Labs Lab 03/29/15 0947 03/30/15 0439  BUN 12 19  CREATININE 0.55 0.44  NA 134* 136  K 3.2* 4.1   Glucose Profile:  Recent Labs  03/29/15 2041 03/30/15 0736 03/30/15 1143  GLUCAP 330* 188* 227*   Meds: ss novolog, glucotrol, solumedrol   Height:   Ht Readings from Last 1 Encounters:  03/29/15 _0  (1.6 m)    Weight: pt thinks she has lost weight but does not know how much; per weight encounters, weight has been stable  Wt Readings from Last 1 Encounters:  03/29/15 170 lb (77.111 kg)    Filed Weights   03/29/15 0938 03/29/15 0955  Weight: 170 lb (77.111 kg) 170 lb (77.111 kg)   Wt Readings from Last 10 Encounters:  03/29/15 170 lb (77.111 kg)  12/14/14 171 lb (77.565 kg)  11/23/14 171 lb (77.565 kg)    BMI:  Body mass index is 30.12 kg/(m^2).  LOW Care Level  Kerman Passey MS, [place], LDN [phone] Pager

## 2015-03-30 NOTE — Progress Notes (Signed)
[place] at [place] NAME: [name]    MR#:  [id number]  DATE OF BIRTH:  Oct 29, 1950  SUBJECTIVE:  CHIEF COMPLAINT:   Chief Complaint  Patient presents with  . Shortness of Breath    Feels better than yesterday, still have some wheezing and cough.  REVIEW OF SYSTEMS:   CONSTITUTIONAL: No fever, positive for cold chills, positive for fatigue. EYES: No blurred or double vision. Wears glasses for reading and driving EARS, NOSE, AND THROAT: Patient is deaf but does read lips No tinnitus or ear pain. Previous sore throat but that is gone now. Positive for runny nose. RESPIRATORY: Positive for cough with yellow phlegm, positive for shortness of breath, positive for wheezing. No hemoptysis.  CARDIOVASCULAR: Positive for chest pain, no orthopnea, edema.  GASTROINTESTINAL: No nausea, vomiting, or abdominal pain. No blood in bowel movements. Some diarrhea GENITOURINARY: No dysuria, hematuria.  ENDOCRINE: No polyuria, nocturia,  HEMATOLOGY: No anemia, easy bruising or bleeding SKIN: No rash or lesion. MUSCULOSKELETAL: No joint pain or arthritis.  NEUROLOGIC: No tingling, numbness, weakness.  PSYCHIATRY: No anxiety or depression.  ROS  DRUG ALLERGIES:   Allergies  Allergen Reactions  . Aspirin   . Celebrex [Celecoxib]   . Ciprofloxacin   . Codeine   . Fosphenytoin   . Levaquin [Levofloxacin In D5w]   . Penicillins   . Sulfa Antibiotics     VITALS:  Blood pressure 130/46, pulse 73, temperature 98.2 F (36.8 C), temperature source Oral, resp. rate 16, height _0  (1.6 m), weight 77.111 kg (170 lb), SpO2 95 %.  PHYSICAL EXAMINATION:   GENERAL: 64 y.o.-year-old patient lying in the bed with no acute distress.  EYES: Pupils equal, round, reactive to light and accommodation. No scleral icterus. Extraocular muscles intact.  HEENT: Head atraumatic, normocephalic. Oropharynx and nasopharynx clear. deaf, and uses signs  to explain, but very well understands the lip reading. NECK: Supple, no jugular venous distention. No thyroid enlargement, no tenderness.  LUNGS: Decreased breath sounds bilaterally, positive wheezing throughout entire lung field, no rales,rhonchi or crepitation. No use of accessory muscles of respiration.  CARDIOVASCULAR: S1, S2 normal. No murmurs, rubs, or gallops.  ABDOMEN: Soft, nontender, nondistended. Bowel sounds present. No organomegaly or mass.  EXTREMITIES: Trace edema, no cyanosis on oxygen.  NEUROLOGIC: Cranial nerves II through XII are intact. Muscle strength 5/5 in all extremities. Sensation intact. Gait not checked.  PSYCHIATRIC: The patient is alert and oriented x 3.  SKIN: No rash, lesion, or ulcer.    Physical Exam LABORATORY PANEL:   CBC  Recent Labs Lab 03/30/15 0439  WBC 7.5  HGB 15.2  HCT 44.3  PLT 237   ------------------------------------------------------------------------------------------------------------------  Chemistries   Recent Labs Lab 03/30/15 0439  NA 136  K 4.1  CL 101  CO2 26  GLUCOSE 185*  BUN 19  CREATININE 0.44  CALCIUM 9.2   ------------------------------------------------------------------------------------------------------------------  Cardiac Enzymes  Recent Labs Lab 03/29/15 0947  TROPONINI <0.03   ------------------------------------------------------------------------------------------------------------------  RADIOLOGY:  Dg Chest 2 View  03/29/2015   CLINICAL DATA:  Short of breath 1 week.  History of COPD and asthma.  EXAM: CHEST  2 VIEW  COMPARISON:  CT 11/05/2013  FINDINGS: Normal cardiac silhouette. Lungs are hyperinflated. No effusion, infiltrate, or pneumothorax. Degenerative osteophytosis of the thoracic spine.  IMPRESSION: Hyperinflated lungs without acute findings.   Electronically Signed   By: Suzy Bouchard M.D.   On: 03/29/2015 10:44    ASSESSMENT AND PLAN:  1. Acute respiratory failure  with hypoxia. Pulse ox 89% on room air at rest. oxygen supplementation. 2. COPD exacerbation-   Solu-Medrol , budesonide nebulizer, DuoNeb nebulizer.   doxycycline. With multiple drug allergies, we are limited with antibiotics. 3. Type 2 diabetes mellitus-  on sliding scale 4. Sleep apnea obesity -patient wears oxygen at night. 5. Essential hypertension- continue usual medications 6. History of CVA- on Plavix 7. History of seizure disorder- stable 10. Osteo-and rheumatoid arthritis- naproxen 11. Restless leg syndrome- lyrica 12. Gastroesophageal reflux disease without esophagitis on PPI at home will continue while here. 13. Tobacco abuse-smoking cessation counseling done 3 minutes by me. Nicotrol inhaler when necessary.   All the records are reviewed and case discussed with Care Management/Social Workerr. Management plans discussed with the patient, family and they are in agreement.  CODE STATUS: full  TOTAL TIME TAKING CARE OF THIS PATIENT:35  minutes.     POSSIBLE D/C IN 1-2 DAYS, DEPENDING ON CLINICAL CONDITION.   Vaughan Basta M.D on 03/30/2015   Between 7am to 6pm - Pager - [phone]  After 6pm go to [email or web address] - password [email or web address] [place] [facility]  Office  [phone]  CC: Primary care physician; Donnie Coffin, MD  Note: This dictation was prepared with Dragon dictation along with smaller phrase technology. Any transcriptional errors that result from this process are unintentional.

## 2015-03-31 DIAGNOSIS — J441 Chronic obstructive pulmonary disease with (acute) exacerbation: Secondary | ICD-10-CM | POA: Diagnosis present

## 2015-03-31 LAB — GLUCOSE, CAPILLARY: Glucose-Capillary: 165 mg/dL — ABNORMAL HIGH (ref 65–99)

## 2015-03-31 MED ORDER — ALBUTEROL SULFATE (2.5 MG/3ML) 0.083% IN NEBU: 2.5000 mg | INHALATION_SOLUTION | RESPIRATORY_TRACT | Status: DC | PRN

## 2015-03-31 MED ORDER — DOXYCYCLINE HYCLATE 100 MG PO TABS: 100.0000 mg | ORAL_TABLET | Freq: Two times a day (BID) | ORAL | Status: DC

## 2015-03-31 MED ORDER — PREDNISONE 50 MG PO TABS: 50.0000 mg | ORAL_TABLET | Freq: Every day | ORAL | Status: DC

## 2015-03-31 MED ORDER — IPRATROPIUM-ALBUTEROL 0.5-2.5 (3) MG/3ML IN SOLN: 3.0000 mL | RESPIRATORY_TRACT | Status: DC | PRN

## 2015-03-31 NOTE — Care Management Note (Signed)
Case Management Note  Patient Details  Name: DONNALEE CELLUCCI MRN: [id number] Date of Birth: Jan 07, 1951  Subjective/Objective:   Referral faxed to [facility] to resume home health RN and SW.                  Action/Plan:   Expected Discharge Date:                  Expected Discharge Plan:  [place]  In-House Referral:     Discharge planning Services  CM Consult  Post Acute Care Choice:    Choice offered to:  Patient  DME Arranged:    DME Agency:  [place]:    [place]:  [place]  Status of Service:     Medicare Important Message Given:    Date Medicare IM Given:    Medicare IM give by:    Date Additional Medicare IM Given:    Additional Medicare Important Message give by:     If discussed at [place] of Stay Meetings, dates discussed:    Additional Comments:  Rockett,Marilyn A, RN 03/31/2015, 12:28 PM

## 2015-03-31 NOTE — Discharge Instructions (Signed)
DIET:  Cardiac diet  DISCHARGE CONDITION:  Stable  ACTIVITY:  Activity as tolerated  OXYGEN:  Home Oxygen: No.   Oxygen Delivery: room air  DISCHARGE LOCATION:  home   If you experience worsening of your admission symptoms, develop shortness of breath, life threatening emergency, suicidal or homicidal thoughts you must seek medical attention immediately by calling 911 or calling your MD immediately  if symptoms less severe.  You Must read complete instructions/literature along with all the possible adverse reactions/side effects for all the Medicines you take and that have been prescribed to you. Take any new Medicines after you have completely understood and accpet all the possible adverse reactions/side effects.   Please note  You were cared for by a hospitalist during your hospital stay. If you have any questions about your discharge medications or the care you received while you were in the hospital after you are discharged, you can call the unit and asked to speak with the hospitalist on call if the hospitalist that took care of you is not available. Once you are discharged, your primary care physician will handle any further medical issues. Please note that NO REFILLS for any discharge medications will be authorized once you are discharged, as it is imperative that you return to your primary care physician (or establish a relationship with a primary care physician if you do not have one) for your aftercare needs so that they can reassess your need for medications and monitor your lab values.

## 2015-03-31 NOTE — Progress Notes (Signed)
I called to speak with the pt's home health nurse. I was not able to speak directly with the nurse directly, but I spoke with Colletta [place] who took the message. She said she was going to forward it to her nurse, Vaughan Basta. Call back information was provided and I also stated that the pt will have her necessary prescriptions at discharge.

## 2015-04-03 NOTE — Discharge Summary (Signed)
[facility] [place] NAME: [name]    MR#:  [id number]  DATE OF BIRTH:  [birth year]  DATE OF ADMISSION:  03/29/2015 ADMITTING PHYSICIAN: Loletha Grayer, MD  DATE OF DISCHARGE: 03/31/2015 11:21 AM  PRIMARY CARE PHYSICIAN: Tomasa Hose A, MD    ADMISSION DIAGNOSIS:  COPD exacerbation [J44.1]  DISCHARGE DIAGNOSIS:  Active Problems:   Acute respiratory failure with hypoxia   COPD exacerbation   SECONDARY DIAGNOSIS:   Past Medical History  Diagnosis Date  . Asthma   . COPD (chronic obstructive pulmonary disease)   . On home oxygen therapy     hs  . Sleep apnea   . Shortness of breath dyspnea   . Hypertension   . Heart murmur   . Seizures   . Neuropathy   . Stroke     tia  . Cirrhosis, non-alcoholic   . Hepatitis   . GERD (gastroesophageal reflux disease)   . Deaf   . Depression   . Diabetes mellitus without complication   . RLS (restless legs syndrome)   . Lymph node disorder     arm  . Orthopnea      ADMITTING HISTORY  [name] is a 64 y.o. female with multiple medical problems including COPD. She's been having shortness of breath that began gradually worsening since Monday. On Monday she called into her 33 office and got a Z-Pak and prednisone. It's made no difference with her breathing. Of note, her husband recently got diagnosed with liver cancer and she started smoking again up to 8 cigarettes a day. Patient's breathing got worse last night and came into the ER for further evaluation. She's been wheezing, and coughing up yellow phlegm. She is also having chest pain with breathing.   HOSPITAL COURSE:    1. Acute respiratory failure with hypoxia. Pulse ox 89% on room air at rest. oxygen supplementation. 2. COPD exacerbation-  Solu-Medrol , budesonide nebulizer, DuoNeb nebulizer.  doxycycline. With multiple drug allergies, we are limited with antibiotics. Prescriptions given for oral  prednisone and doxycycline along with DuoNeb nebulizer at discharge. 3. Type 2 diabetes mellitus-  on sliding scale 4. Sleep apnea obesity -patient wears oxygen at night. 5. Essential hypertension- continue usual medications 6. History of CVA- on Plavix 7. History of seizure disorder- stable 10. Osteo-and rheumatoid arthritis- naproxen 11. Restless leg syndrome- lyrica 12. Gastroesophageal reflux disease without esophagitis on PPI at home. Continued in hospital. 13. Tobacco abuse-smoking cessation counseling done  Patient feels back to baseline. Stable for discharge to follow up with her primary care physician as outpatient.   CONSULTS OBTAINED:  Treatment Team:  Erby Pian, MD  DRUG ALLERGIES:   Allergies  Allergen Reactions  . Aspirin   . Celebrex [Celecoxib]   . Ciprofloxacin   . Codeine   . Fosphenytoin   . Levaquin [Levofloxacin In D5w]   . Penicillins   . Sulfa Antibiotics     DISCHARGE MEDICATIONS:   Discharge Medication List as of 03/31/2015 10:18 AM    START taking these medications   Details  doxycycline (VIBRA-TABS) 100 MG tablet Take 1 tablet (100 mg total) by mouth every 12 (twelve) hours., Starting 03/31/2015, Until Discontinued, Print    !! predniSONE (DELTASONE) 50 MG tablet Take 1 tablet (50 mg total) by mouth daily with breakfast., Starting 03/31/2015, Until Discontinued, Print     !! - Potential duplicate medications found. Please discuss with provider.    CONTINUE these medications  which have NOT CHANGED   Details  albuterol (PROVENTIL HFA;VENTOLIN HFA) 108 (90 BASE) MCG/ACT inhaler Inhale 2 puffs into the lungs every 4 (four) hours as needed for wheezing or shortness of breath., Until Discontinued, Historical Med    amLODipine (NORVASC) 10 MG tablet Take 10 mg by mouth daily., Until Discontinued, Historical Med    carboxymethylcellulose (REFRESH TEARS) 0.5 % SOLN Place 1 drop into both eyes daily as needed., Until Discontinued, Historical  Med    clopidogrel (PLAVIX) 75 MG tablet Take 75 mg by mouth daily., Until Discontinued, Historical Med    diphenhydrAMINE (SOMINEX) 25 MG tablet Take 25 mg by mouth at bedtime as needed for sleep., Until Discontinued, Historical Med    enalapril (VASOTEC) 20 MG tablet Take 20 mg by mouth daily., Until Discontinued, Historical Med    EPINEPHrine 0.3 mg/0.3 mL IJ SOAJ injection Inject into the muscle once., Historical Med    Fluticasone-Salmeterol (ADVAIR) 500-50 MCG/DOSE AEPB Inhale 1 puff into the lungs 2 (two) times daily., Until Discontinued, Historical Med    glipiZIDE (GLUCOTROL XL) 10 MG 24 hr tablet Take 10 mg by mouth daily with breakfast., Until Discontinued, Historical Med    hydrochlorothiazide (HYDRODIURIL) 25 MG tablet Take 25 mg by mouth daily., Until Discontinued, Historical Med    naproxen (NAPROSYN) 500 MG tablet Take 500 mg by mouth 2 (two) times daily with a meal., Until Discontinued, Historical Med    nepafenac (ILEVRO) 0.3 % ophthalmic suspension Place 1 drop into the left eye daily., Until Discontinued, Historical Med    omeprazole (PRILOSEC) 20 MG capsule Take 20 mg by mouth daily., Until Discontinued, Historical Med    potassium chloride SA (K-DUR,KLOR-CON) 20 MEQ tablet Take 20 mEq by mouth 2 (two) times daily., Until Discontinued, Historical Med    !! predniSONE (DELTASONE) 10 MG tablet Take 10 mg by mouth daily with breakfast. 4 tablets for 2 days, 3 tablets for 2 days, 2 tablets for 2 days and 1 tablet for 2 days., Until Discontinued, Historical Med    pregabalin (LYRICA) 150 MG capsule Take 150 mg by mouth at bedtime., Until Discontinued, Historical Med    sennosides-docusate sodium (SENOKOT-S) 8.6-50 MG tablet Take 1 tablet by mouth 2 (two) times daily as needed for constipation., Until Discontinued, Historical Med    albuterol (PROVENTIL) (2.5 MG/3ML) 0.083% nebulizer solution Take 2.5 mg by nebulization., Until Discontinued, Historical Med     !! -  Potential duplicate medications found. Please discuss with provider.    STOP taking these medications     azithromycin (ZITHROMAX) 250 MG tablet          Today    VITAL SIGNS:  Blood pressure 143/57, pulse 66, temperature 98.2 F (36.8 C), temperature source Oral, resp. rate 16, height _0  (1.6 m), weight 77.111 kg (170 lb), SpO2 93 %.  I/O:  No intake or output data in the 24 hours ending 04/03/15 1403  PHYSICAL EXAMINATION:  Physical Exam  GENERAL:  64 y.o.-year-old patient lying in the bed with no acute distress.  LUNGS: Normal breath sounds bilaterally, no wheezing, rales,rhonchi or crepitation. No use of accessory muscles of respiration.  CARDIOVASCULAR: S1, S2 normal. No murmurs, rubs, or gallops.  ABDOMEN: Soft, non-tender, non-distended. Bowel sounds present. No organomegaly or mass.  NEUROLOGIC: Moves all 4 extremities. PSYCHIATRIC: The patient is alert and oriented x 3.  SKIN: No obvious rash, lesion, or ulcer.   DATA REVIEW:   CBC  Recent Labs Lab 03/30/15 0439  WBC  7.5  HGB 15.2  HCT 44.3  PLT 237    Chemistries   Recent Labs Lab 03/30/15 0439  NA 136  K 4.1  CL 101  CO2 26  GLUCOSE 185*  BUN 19  CREATININE 0.44  CALCIUM 9.2    Cardiac Enzymes  Recent Labs Lab 03/29/15 0947  TROPONINI <0.03    Microbiology Results  Results for orders placed or performed in visit on 04/23/13  Culture, blood (single)     Status: None   Collection Time: 04/23/13  6:09 PM  Result Value Ref Range Status   Micro Text Report   Final       COMMENT                   NO GROWTH AEROBICALLY/ANAEROBICALLY IN 5 DAYS   ANTIBIOTIC                                                      Culture, blood (single)     Status: None   Collection Time: 04/23/13  6:09 PM  Result Value Ref Range Status   Micro Text Report   Final       COMMENT                   NO GROWTH AEROBICALLY/ANAEROBICALLY IN 5 DAYS   ANTIBIOTIC                                                       Clostridium Difficile by PCR     Status: None   Collection Time: 04/24/13  9:50 AM  Result Value Ref Range Status   Micro Text Report   Final       COMMENT                   NEGATIVE-CLOS.DIFFICILE TOXIN NOT DETECTED BY PCR   ANTIBIOTIC                                                        RADIOLOGY:  No results found.    Follow up with PCP in 1 week.  Management plans discussed with the patient, family and they are in agreement.  CODE STATUS:  Advance Directive Documentation        Most Recent Value   Type of Advance Directive  Living will   Pre-existing out of facility DNR order (yellow form or pink MOST form)     "MOST" Form in Place?        TOTAL TIME TAKING CARE OF THIS PATIENT ON DAY OF DISCHARGE: more than 30 minutes.    Hillary Bow R M.D on 04/03/2015 at 2:03 PM  Between 7am to 6pm - Pager - [phone]  After 6pm go to [email or web address] - password [email or web address] [facility]  Office  [phone]  CC: Primary care physician; Donnie Coffin, MD     Note: This dictation was prepared with Dragon dictation along with smaller phrase technology. Any transcriptional errors  that result from this process are unintentional.

## 2015-07-13 ENCOUNTER — Emergency Department
Admission: EM | Admit: 2015-07-13 | Discharge: 2015-07-13 | Disposition: A | Discharge status: Home / Self Care | Payer: Medicaid Other | Attending: Emergency Medicine | Admitting: Emergency Medicine

## 2015-07-13 ENCOUNTER — Emergency Department: Payer: Medicaid Other

## 2015-07-13 DIAGNOSIS — I10 Essential (primary) hypertension: Secondary | ICD-10-CM | POA: Diagnosis not present

## 2015-07-13 DIAGNOSIS — Z792 Long term (current) use of antibiotics: Secondary | ICD-10-CM | POA: Diagnosis not present

## 2015-07-13 DIAGNOSIS — Z79899 Other long term (current) drug therapy: Secondary | ICD-10-CM | POA: Insufficient documentation

## 2015-07-13 DIAGNOSIS — Z880 Allergy status to penicillin: Secondary | ICD-10-CM | POA: Diagnosis not present

## 2015-07-13 DIAGNOSIS — E119 Type 2 diabetes mellitus without complications: Secondary | ICD-10-CM | POA: Diagnosis not present

## 2015-07-13 DIAGNOSIS — Z9981 Dependence on supplemental oxygen: Secondary | ICD-10-CM | POA: Diagnosis not present

## 2015-07-13 DIAGNOSIS — J441 Chronic obstructive pulmonary disease with (acute) exacerbation: Secondary | ICD-10-CM | POA: Diagnosis not present

## 2015-07-13 DIAGNOSIS — Z7902 Long term (current) use of antithrombotics/antiplatelets: Secondary | ICD-10-CM | POA: Insufficient documentation

## 2015-07-13 DIAGNOSIS — R0602 Shortness of breath: Secondary | ICD-10-CM | POA: Diagnosis present

## 2015-07-13 DIAGNOSIS — F17200 Nicotine dependence, unspecified, uncomplicated: Secondary | ICD-10-CM | POA: Diagnosis not present

## 2015-07-13 DIAGNOSIS — Z791 Long term (current) use of non-steroidal anti-inflammatories (NSAID): Secondary | ICD-10-CM | POA: Insufficient documentation

## 2015-07-13 DIAGNOSIS — Z7952 Long term (current) use of systemic steroids: Secondary | ICD-10-CM | POA: Diagnosis not present

## 2015-07-13 DIAGNOSIS — Z7951 Long term (current) use of inhaled steroids: Secondary | ICD-10-CM | POA: Insufficient documentation

## 2015-07-13 LAB — CBC
HEMATOCRIT: 46.7 % (ref 35.0–47.0)
HEMOGLOBIN: 15.8 g/dL (ref 12.0–16.0)
MCH: 27.5 pg (ref 26.0–34.0)
MCHC: 33.7 g/dL (ref 32.0–36.0)
MCV: 81.4 fL (ref 80.0–100.0)
Platelets: 266 10*3/uL (ref 150–440)
RBC: 5.73 MIL/uL — AB (ref 3.80–5.20)
RDW: 13.6 % (ref 11.5–14.5)
WBC: 9.3 10*3/uL (ref 3.6–11.0)

## 2015-07-13 LAB — BASIC METABOLIC PANEL
Anion gap: 12 (ref 5–15)
BUN: 6 mg/dL (ref 6–20)
CHLORIDE: 91 mmol/L — AB (ref 101–111)
CO2: 29 mmol/L (ref 22–32)
Calcium: 8.9 mg/dL (ref 8.9–10.3)
Creatinine, Ser: 0.47 mg/dL (ref 0.44–1.00)
GFR calc non Af Amer: >60 mL/min (ref >60)
Glucose, Bld: 155 mg/dL — ABNORMAL HIGH (ref 65–99)
POTASSIUM: 2.8 mmol/L — AB (ref 3.5–5.1)
SODIUM: 132 mmol/L — AB (ref 135–145)

## 2015-07-13 LAB — TROPONIN I

## 2015-07-13 LAB — BRAIN NATRIURETIC PEPTIDE: B NATRIURETIC PEPTIDE 5: 44 pg/mL (ref 0.0–100.0)

## 2015-07-13 MED ORDER — POTASSIUM CHLORIDE CRYS ER 20 MEQ PO TBCR
20.0000 meq | EXTENDED_RELEASE_TABLET | Freq: Once | ORAL | Status: DC
  Filled 2015-07-13: qty 1

## 2015-07-13 MED ORDER — IPRATROPIUM-ALBUTEROL 0.5-2.5 (3) MG/3ML IN SOLN
3.0000 mL | Freq: Once | RESPIRATORY_TRACT | Status: AC
  Administered 2015-07-13: 3 mL via RESPIRATORY_TRACT

## 2015-07-13 MED ORDER — IPRATROPIUM-ALBUTEROL 0.5-2.5 (3) MG/3ML IN SOLN
3.0000 mL | Freq: Once | RESPIRATORY_TRACT | Status: AC
  Administered 2015-07-13: 3 mL via RESPIRATORY_TRACT
  Filled 2015-07-13: qty 3

## 2015-07-13 MED ORDER — POTASSIUM CHLORIDE 20 MEQ PO PACK
PACK | ORAL | Status: AC
Start: 2015-07-13 — End: 2015-07-13
  Administered 2015-07-13: 20 meq
  Filled 2015-07-13: qty 1

## 2015-07-13 MED ORDER — IPRATROPIUM-ALBUTEROL 0.5-2.5 (3) MG/3ML IN SOLN
RESPIRATORY_TRACT | Status: AC
  Administered 2015-07-13: 3 mL via RESPIRATORY_TRACT
  Filled 2015-07-13: qty 3

## 2015-07-13 MED ORDER — METHYLPREDNISOLONE SODIUM SUCC 125 MG IJ SOLR: 60.0000 mg | Freq: Once | INTRAMUSCULAR | Status: AC

## 2015-07-13 MED ORDER — METHYLPREDNISOLONE SODIUM SUCC 40 MG IJ SOLR
INTRAMUSCULAR | Status: AC
  Administered 2015-07-13: 60 mg
  Filled 2015-07-13: qty 2

## 2015-07-13 NOTE — ED Provider Notes (Signed)
[facility] Emergency Department Provider Note  ____________________________________________  Time seen: 11 AM  I have reviewed the triage vital signs and the nursing notes.  History by:  Patient. Limited by her deafness. Additional history from her daughter.  HISTORY  Chief Complaint Shortness of Breath     HPI [name] is a 65 y.o. female with a history of COPD who presents emergency department by EMS with ongoing shortness of breath. She uses oxygen at home. This is usually at 2 L. She is needed to use a higher amount. It is noted that she satting 89% on 4 L upon my exam.  She went to see her Dr. Princella Ion yesterday. Prescriptions were written for doxycycline and prednisone. She has not been able to take these medications yet as she felt she couldn't tolerate any medicines by mouth this morning. She did take a nebulizer treatment at home. She reports she feels a little bit better currently.   Past Medical History  Diagnosis Date  . Asthma   . COPD (chronic obstructive pulmonary disease)   . On home oxygen therapy     hs  . Sleep apnea   . Shortness of breath dyspnea   . Hypertension   . Heart murmur   . Seizures   . Neuropathy   . Stroke     tia  . Cirrhosis, non-alcoholic   . Hepatitis   . GERD (gastroesophageal reflux disease)   . Deaf   . Depression   . Diabetes mellitus without complication   . RLS (restless legs syndrome)   . Lymph node disorder     arm  . Orthopnea     Patient Active Problem List   Diagnosis Date Noted  . COPD exacerbation ([place]) 03/31/2015  . Acute respiratory failure with hypoxia ([place]) 03/29/2015    Past Surgical History  Procedure Laterality Date  . Thumb arthroscopy    . Tonsillectomy    . Cesarean section    . Cholecystectomy    . Tympanoplasty      muliple  . Knee arthroplasty    . Cataract extraction w/phaco Right 11/23/2014    Procedure: CATARACT EXTRACTION PHACO AND INTRAOCULAR LENS  PLACEMENT (IOC);  Surgeon: Lyla Glassing, MD;  Location: [facility] ORS;  Service: Ophthalmology;  Laterality: Right;  . Cataract extraction w/phaco Left 12/14/2014    Procedure: CATARACT EXTRACTION PHACO AND INTRAOCULAR LENS PLACEMENT (IOC);  Surgeon: Lyla Glassing, MD;  Location: [facility] ORS;  Service: Ophthalmology;  Laterality: Left;  US:01:16.6 AP:15.8 CDE:12.14    Current Outpatient Rx  Name  Route  Sig  Dispense  Refill  . albuterol (PROVENTIL HFA;VENTOLIN HFA) 108 (90 BASE) MCG/ACT inhaler   Inhalation   Inhale 2 puffs into the lungs every 4 (four) hours as needed for wheezing or shortness of breath.         Marland Kitchen albuterol (PROVENTIL) (2.5 MG/3ML) 0.083% nebulizer solution   Nebulization   Take 3 mLs (2.5 mg total) by nebulization every 4 (four) hours as needed for wheezing or shortness of breath.   90 mL   2   . amLODipine (NORVASC) 10 MG tablet   Oral   Take 10 mg by mouth daily.         . carboxymethylcellulose (REFRESH TEARS) 0.5 % SOLN   Both Eyes   Place 1 drop into both eyes daily as needed.         . citalopram (CELEXA) 20 MG tablet   Oral   Take  20 mg by mouth daily.         . clopidogrel (PLAVIX) 75 MG tablet   Oral   Take 75 mg by mouth daily.         . diphenhydrAMINE (SOMINEX) 25 MG tablet   Oral   Take 25 mg by mouth at bedtime as needed for sleep.         Marland Kitchen doxycycline (VIBRA-TABS) 100 MG tablet   Oral   Take 1 tablet (100 mg total) by mouth every 12 (twelve) hours.   10 tablet   0   . enalapril (VASOTEC) 20 MG tablet   Oral   Take 20 mg by mouth daily.         Marland Kitchen EPINEPHrine 0.3 mg/0.3 mL IJ SOAJ injection   Intramuscular   Inject into the muscle once.         . Fluticasone-Salmeterol (ADVAIR) 500-50 MCG/DOSE AEPB   Inhalation   Inhale 1 puff into the lungs 2 (two) times daily.         Marland Kitchen glipiZIDE (GLUCOTROL XL) 10 MG 24 hr tablet   Oral   Take 10 mg by mouth daily with breakfast.         . hydrochlorothiazide (HYDRODIURIL)  25 MG tablet   Oral   Take 25 mg by mouth daily.         . hydrocortisone 2.5 % cream   Topical   Apply 1 application topically 2 (two) times daily.         Marland Kitchen ipratropium-albuterol (DUONEB) 0.5-2.5 (3) MG/3ML SOLN   Nebulization   Take 3 mLs by nebulization every 6 (six) hours as needed.         . naproxen (NAPROSYN) 500 MG tablet   Oral   Take 500 mg by mouth 2 (two) times daily with a meal.         . omeprazole (PRILOSEC) 20 MG capsule   Oral   Take 20 mg by mouth daily.         . potassium chloride (KLOR-CON) 20 MEQ packet   Oral   Take 20 mEq by mouth 2 (two) times daily.         . predniSONE (DELTASONE) 50 MG tablet   Oral   Take 1 tablet (50 mg total) by mouth daily with breakfast.   5 tablet   0   . pregabalin (LYRICA) 150 MG capsule   Oral   Take 150 mg by mouth at bedtime.         Marland Kitchen rOPINIRole (REQUIP) 0.25 MG tablet   Oral   Take 0.25 mg by mouth 3 (three) times daily.         . sennosides-docusate sodium (SENOKOT-S) 8.6-50 MG tablet   Oral   Take 1 tablet by mouth 2 (two) times daily as needed for constipation.           Allergies Aspirin; Celebrex; Ciprofloxacin; Codeine; Fosphenytoin; Levaquin; Penicillins; and Sulfa antibiotics  Family History  Problem Relation Age of Onset  . Lung cancer Mother   . CAD Father     Social History Social History  Substance Use Topics  . Smoking status: Current Every Day Smoker -- 0.50 packs/day  . Smokeless tobacco: Not on file  . Alcohol Use: No    Review of Systems  Constitutional: Negative for fever/chills. ENT: Negative for congestion. Cardiovascular: Negative for chest pain. Respiratory: COPD, shortness of breath. See history of present illness Gastrointestinal: Negative for abdominal pain, vomiting and  diarrhea. Genitourinary: Negative for dysuria. Musculoskeletal: No back pain. Skin: Negative for rash. Neurological: Negative for headache or focal weakness   10-point ROS  otherwise negative.  ____________________________________________   PHYSICAL EXAM:  VITAL SIGNS: ED Triage Vitals  Enc Vitals Group     BP 07/13/15 1012 135/60 mmHg     Pulse Rate 07/13/15 1012 66     Resp 07/13/15 1012 27     Temp 07/13/15 1012 98.4 F (36.9 C)     Temp Source 07/13/15 1012 Oral     SpO2 07/13/15 1012 88 %     Weight 07/13/15 1012 155 lb (70.308 kg)     Height 07/13/15 1012 _0  (1.6 m)     Head Cir --      Peak Flow --      Pain Score --      Pain Loc --      Pain Edu? --      Excl. in [place]? --     Constitutional:  Alert. Deaf, but reads lips. Communicative.  ENT   Head: Normocephalic and atraumatic.   Nose: No congestion/rhinnorhea.       Mouth: No erythema, no swelling   Cardiovascular: Normal rate, regular rhythm, no murmur noted Respiratory:  Normal respiratory effort, no tachypnea.    Diffuse bilateral wheezing. Gastrointestinal: Soft, no distention. Nontender Back: No muscle spasm, no tenderness, no CVA tenderness. Musculoskeletal: No deformity noted. Nontender with normal range of motion in all extremities.  No noted edema. Neurologic:  Communicative. Normal appearing spontaneous movement in all 4 extremities. No gross focal neurologic deficits are appreciated.  Skin:  Skin is warm, dry. No rash noted. ____________________________________________    LABS (pertinent positives/negatives)  Labs Reviewed  CBC - Abnormal; Notable for the following:    RBC 5.73 (*)    All other components within normal limits  BASIC METABOLIC PANEL - Abnormal; Notable for the following:    Sodium 132 (*)    Potassium 2.8 (*)    Chloride 91 (*)    Glucose, Bld 155 (*)    All other components within normal limits  TROPONIN I  BRAIN NATRIURETIC PEPTIDE     ____________________________________________   EKG  ED ECG REPORT I, Dejanique Ruehl W, the attending physician, personally viewed and interpreted this ECG.   Date: 07/13/2015  EKG Time:  10:11 AM  Rate: 67  Rhythm:  Normal sinus rhythm  Axis: Normal  Intervals: Normal  ST&T Change: None noted   ____________________________________________    RADIOLOGY  Chest x-ray:  FINDINGS: Cardiomediastinal silhouette is stable. Mild hyperinflation. No acute infiltrate or pleural effusion. No pulmonary edema. Osteopenia and mild degenerative changes thoracic spine. Mild degenerative changes bilateral shoulders.  IMPRESSION: No active disease. Mild hyperinflation again noted.  ____________________________________________  ____________________________________________   INITIAL IMPRESSION / ASSESSMENT AND PLAN / ED COURSE  Pertinent labs & imaging results that were available during my care of the patient were reviewed by me and considered in my medical decision making (see chart for details).  65 year old female with COPD with ongoing exacerbation. Unable to tolerate medicines by mouth at home. We will treat her with another neb treatment now as well as with soluMedrol IV.  ----------------------------------------- 1:21 PM on 07/13/2015 -----------------------------------------  The patient told the nurse she would like to go home and would be willing to do this against medical advice.  I've reexamined the patient and met with her. Her lungs are better now than when I first evaluated him. She looks more  comfortable. Her oxygen saturation level appears to be improving some. The patient is willing to stay for one more neb treatment. She was unable to take her medicines morning as she can tolerate things by mouth, but she reports she is more comfortable now and has a things that she needs at home, including oxygen, nebulizers, and steroids.  ____________________________________________   FINAL CLINICAL IMPRESSION(S) / ED DIAGNOSES  Final diagnoses:  COPD exacerbation ([place])      Ahmed Prima, MD 07/13/15 1323

## 2015-07-13 NOTE — ED Notes (Addendum)
Patient arrives via [facility] with c/o ShOB since Monday worsening today. Patient was seen by her PCP at [facility] MD) and diagnosed with COPD exacerbation and prescribed Duonebs, Prednisone, and Doxy. Patient has not started PO medication because she 'hasnt been able to hold anything down'. Patient is on baseline 2lnc at home with a normal SPO2 of 90-92. Patient noted left sided, pressure type chest pain. Patient was supposed to follow up with cardiology for "fluid around her heart" but has not followed up

## 2015-07-13 NOTE — ED Notes (Signed)
K+ 2.8 value : md kaminski notified

## 2015-07-13 NOTE — Discharge Instructions (Signed)
Continue with your currently prescribed medications at home. Use your nebulizers as needed and as previously instructed. Return to the emergency department if you have worsening shortness of breath or other urgent concerns.  Chronic Obstructive Pulmonary Disease Exacerbation Chronic obstructive pulmonary disease (COPD) is a common lung problem. In COPD, the flow of air from the lungs is limited. COPD exacerbations are times that breathing gets worse and you need extra treatment. Without treatment they can be life threatening. If they happen often, your lungs can become more damaged. If your COPD gets worse, your doctor may treat you with:  Medicines.  Oxygen.  Different ways to clear your airway, such as using a mask. HOME CARE  Do not smoke.  Avoid tobacco smoke and other things that bother your lungs.  If given, take your antibiotic medicine as told. Finish the medicine even if you start to feel better.  Only take medicines as told by your doctor.  Drink enough fluids to keep your pee (urine) clear or pale yellow (unless your doctor has told you not to).  Use a cool mist machine (vaporizer).  If you use oxygen or a machine that turns liquid medicine into a mist (nebulizer), continue to use them as told.  Keep up with shots (vaccinations) as told by your doctor.  Exercise regularly.  Eat healthy foods.  Keep all doctor visits as told. GET HELP RIGHT AWAY IF:  You are very short of breath and it gets worse.  You have trouble talking.  You have bad chest pain.  You have blood in your spit (sputum).  You have a fever.  You keep throwing up (vomiting).  You feel weak, or you pass out (faint).  You feel confused.  You keep getting worse. MAKE SURE YOU:  Understand these instructions.  Will watch your condition.  Will get help right away if you are not doing well or get worse.   This information is not intended to replace advice given to you by your health care  provider. Make sure you discuss any questions you have with your health care provider.   Document Released: 06/12/2011 Document Revised: 07/14/2014 Document Reviewed: 02/25/2013 Elsevier Interactive Patient Education [employer].

## 2015-10-30 ENCOUNTER — Encounter: Payer: Self-pay | Admitting: *Deleted

## 2015-10-30 ENCOUNTER — Emergency Department: Payer: BLUE CROSS/BLUE SHIELD

## 2015-10-30 ENCOUNTER — Emergency Department
Admission: EM | Admit: 2015-10-30 | Discharge: 2015-10-30 | Disposition: A | Discharge status: Home / Self Care | Payer: BLUE CROSS/BLUE SHIELD | Attending: Emergency Medicine | Admitting: Emergency Medicine

## 2015-10-30 DIAGNOSIS — F329 Major depressive disorder, single episode, unspecified: Secondary | ICD-10-CM | POA: Diagnosis not present

## 2015-10-30 DIAGNOSIS — I10 Essential (primary) hypertension: Secondary | ICD-10-CM | POA: Diagnosis not present

## 2015-10-30 DIAGNOSIS — Z792 Long term (current) use of antibiotics: Secondary | ICD-10-CM | POA: Insufficient documentation

## 2015-10-30 DIAGNOSIS — M25552 Pain in left hip: Secondary | ICD-10-CM | POA: Diagnosis present

## 2015-10-30 DIAGNOSIS — J449 Chronic obstructive pulmonary disease, unspecified: Secondary | ICD-10-CM | POA: Insufficient documentation

## 2015-10-30 DIAGNOSIS — Z7984 Long term (current) use of oral hypoglycemic drugs: Secondary | ICD-10-CM | POA: Diagnosis not present

## 2015-10-30 DIAGNOSIS — J189 Pneumonia, unspecified organism: Secondary | ICD-10-CM | POA: Diagnosis not present

## 2015-10-30 DIAGNOSIS — M5442 Lumbago with sciatica, left side: Secondary | ICD-10-CM | POA: Diagnosis not present

## 2015-10-30 DIAGNOSIS — F17200 Nicotine dependence, unspecified, uncomplicated: Secondary | ICD-10-CM | POA: Insufficient documentation

## 2015-10-30 DIAGNOSIS — E1140 Type 2 diabetes mellitus with diabetic neuropathy, unspecified: Secondary | ICD-10-CM | POA: Insufficient documentation

## 2015-10-30 DIAGNOSIS — J9601 Acute respiratory failure with hypoxia: Secondary | ICD-10-CM | POA: Insufficient documentation

## 2015-10-30 DIAGNOSIS — Z8673 Personal history of transient ischemic attack (TIA), and cerebral infarction without residual deficits: Secondary | ICD-10-CM | POA: Insufficient documentation

## 2015-10-30 DIAGNOSIS — Z79899 Other long term (current) drug therapy: Secondary | ICD-10-CM | POA: Insufficient documentation

## 2015-10-30 DIAGNOSIS — J45909 Unspecified asthma, uncomplicated: Secondary | ICD-10-CM | POA: Diagnosis not present

## 2015-10-30 MED ORDER — AZITHROMYCIN 250 MG PO TABS: ORAL_TABLET | ORAL | Status: DC

## 2015-10-30 MED ORDER — PREDNISONE 20 MG PO TABS
60.0000 mg | ORAL_TABLET | Freq: Once | ORAL | Status: AC
  Administered 2015-10-30: 60 mg via ORAL
  Filled 2015-10-30: qty 3

## 2015-10-30 MED ORDER — PREDNISONE 10 MG (21) PO TBPK: ORAL_TABLET | ORAL | Status: DC

## 2015-10-30 MED ORDER — OXYCODONE HCL 5 MG PO TABS: 5.0000 mg | ORAL_TABLET | Freq: Three times a day (TID) | ORAL | Status: DC | PRN

## 2015-10-30 MED ORDER — IPRATROPIUM-ALBUTEROL 0.5-2.5 (3) MG/3ML IN SOLN
3.0000 mL | Freq: Once | RESPIRATORY_TRACT | Status: AC
  Administered 2015-10-30: 3 mL via RESPIRATORY_TRACT
  Filled 2015-10-30: qty 3

## 2015-10-30 NOTE — ED Notes (Signed)
Pt c/o chronic hip/lower back pain that is worse in the past couple of days.

## 2015-10-30 NOTE — ED Notes (Signed)
Pt is deaf

## 2015-10-30 NOTE — Discharge Instructions (Signed)
Community-Acquired Pneumonia, Adult Pneumonia is an infection of the lungs. There are different types of pneumonia. One type can develop while a person is in a hospital. A different type, called community-acquired pneumonia, develops in people who are not, or have not recently been, in the hospital or other health care facility.  CAUSES Pneumonia may be caused by bacteria, viruses, or funguses. Community-acquired pneumonia is often caused by Streptococcus pneumonia bacteria. These bacteria are often passed from one person to another by breathing in droplets from the cough or sneeze of an infected person. RISK FACTORS The condition is more likely to develop in:  People who havechronic diseases, such as chronic obstructive pulmonary disease (COPD), asthma, congestive heart failure, cystic fibrosis, diabetes, or kidney disease.  People who haveearly-stage or late-stage HIV.  People who havesickle cell disease.  People who havehad their spleen removed (splenectomy).  People who havepoor Human resources officer.  People who havemedical conditions that increase the risk of breathing in (aspirating) secretions their own mouth and nose.   People who havea weakened immune system (immunocompromised).  People who smoke.  People whotravel to areas where pneumonia-causing germs commonly exist.  People whoare around animal habitats or animals that have pneumonia-causing germs, including birds, bats, rabbits, cats, and farm animals. SYMPTOMS Symptoms of this condition include:  Adry cough.  A wet (productive) cough.  Fever.  Sweating.  Chest pain, especially when breathing deeply or coughing.  Rapid breathing or difficulty breathing.  Shortness of breath.  Shaking chills.  Fatigue.  Muscle aches. DIAGNOSIS Your health care provider will take a medical history and perform a physical exam. You may also have other tests, including:  Imaging studies of your chest, including  X-rays.  Tests to check your blood oxygen level and other blood gases.  Other tests on blood, mucus (sputum), fluid around your lungs (pleural fluid), and urine. If your pneumonia is severe, other tests may be done to identify the specific cause of your illness. TREATMENT The type of treatment that you receive depends on many factors, such as the cause of your pneumonia, the medicines you take, and other medical conditions that you have. For most adults, treatment and recovery from pneumonia may occur at home. In some cases, treatment must happen in a hospital. Treatment may include:  Antibiotic medicines, if the pneumonia was caused by bacteria.  Antiviral medicines, if the pneumonia was caused by a virus.  Medicines that are given by mouth or through an IV tube.  Oxygen.  Respiratory therapy. Although rare, treating severe pneumonia may include:  Mechanical ventilation. This is done if you are not breathing well on your own and you cannot maintain a safe blood oxygen level.  Thoracentesis. This procedureremoves fluid around one lung or both lungs to help you breathe better. HOME CARE INSTRUCTIONS  Take over-the-counter and prescription medicines only as told by your health care provider.  Only takecough medicine if you are losing sleep. Understand that cough medicine can prevent your body's natural ability to remove mucus from your lungs.  If you were prescribed an antibiotic medicine, take it as told by your health care provider. Do not stop taking the antibiotic even if you start to feel better.  Sleep in a semi-upright position at night. Try sleeping in a reclining chair, or place a few pillows under your head.  Do not use tobacco products, including cigarettes, chewing tobacco, and e-cigarettes. If you need help quitting, ask your health care provider.  Drink enough water to keep your urine  clear or pale yellow. This will help to thin out mucus secretions in your  lungs. PREVENTION There are ways that you can decrease your risk of developing community-acquired pneumonia. Consider getting a pneumococcal vaccine if:  You are older than 64 years of age.  You are older than 64 years of age and are undergoing cancer treatment, have chronic lung disease, or have other medical conditions that affect your immune system. Ask your health care provider if this applies to you. There are different types and schedules of pneumococcal vaccines. Ask your health care provider which vaccination option is best for you. You may also prevent community-acquired pneumonia if you take these actions:  Get an influenza vaccine every year. Ask your health care provider which type of influenza vaccine is best for you.  Go to the dentist on a regular basis.  Wash your hands often. Use hand sanitizer if soap and water are not available. SEEK MEDICAL CARE IF:  You have a fever.  You are losing sleep because you cannot control your cough with cough medicine. SEEK IMMEDIATE MEDICAL CARE IF:  You have worsening shortness of breath.  You have increased chest pain.  Your sickness becomes worse, especially if you are an older adult or have a weakened immune system.  You cough up blood.   This information is not intended to replace advice given to you by your health care provider. Make sure you discuss any questions you have with your health care provider.   Document Released: 06/23/2005 Document Revised: 03/14/2015 Document Reviewed: 10/18/2014 Elsevier Interactive Patient Education 2016 Elsevier Inc.  Sciatica Sciatica is pain, weakness, numbness, or tingling along the path of the sciatic nerve. The nerve starts in the lower back and runs down the back of each leg. The nerve controls the muscles in the lower leg and in the back of the knee, while also providing sensation to the back of the thigh, lower leg, and the sole of your foot. Sciatica is a symptom of another medical  condition. For instance, nerve damage or certain conditions, such as a herniated disk or bone spur on the spine, pinch or put pressure on the sciatic nerve. This causes the pain, weakness, or other sensations normally associated with sciatica. Generally, sciatica only affects one side of the body. CAUSES   Herniated or slipped disc.  Degenerative disk disease.  A pain disorder involving the narrow muscle in the buttocks (piriformis syndrome).  Pelvic injury or fracture.  Pregnancy.  Tumor (rare). SYMPTOMS  Symptoms can vary from mild to very severe. The symptoms usually travel from the low back to the buttocks and down the back of the leg. Symptoms can include:  Mild tingling or dull aches in the lower back, leg, or hip.  Numbness in the back of the calf or sole of the foot.  Burning sensations in the lower back, leg, or hip.  Sharp pains in the lower back, leg, or hip.  Leg weakness.  Severe back pain inhibiting movement. These symptoms may get worse with coughing, sneezing, laughing, or prolonged sitting or standing. Also, being overweight may worsen symptoms. DIAGNOSIS  Your caregiver will perform a physical exam to look for common symptoms of sciatica. He or she may ask you to do certain movements or activities that would trigger sciatic nerve pain. Other tests may be performed to find the cause of the sciatica. These may include:  Blood tests.  X-rays.  Imaging tests, such as an MRI or CT scan. TREATMENT  Treatment is directed at the cause of the sciatic pain. Sometimes, treatment is not necessary and the pain and discomfort goes away on its own. If treatment is needed, your caregiver may suggest:  Over-the-counter medicines to relieve pain.  Prescription medicines, such as anti-inflammatory medicine, muscle relaxants, or narcotics.  Applying heat or ice to the painful area.  Steroid injections to lessen pain, irritation, and inflammation around the  nerve.  Reducing activity during periods of pain.  Exercising and stretching to strengthen your abdomen and improve flexibility of your spine. Your caregiver may suggest losing weight if the extra weight makes the back pain worse.  Physical therapy.  Surgery to eliminate what is pressing or pinching the nerve, such as a bone spur or part of a herniated disk. HOME CARE INSTRUCTIONS   Only take over-the-counter or prescription medicines for pain or discomfort as directed by your caregiver.  Apply ice to the affected area for 20 minutes, 3-4 times a day for the first 48-72 hours. Then try heat in the same way.  Exercise, stretch, or perform your usual activities if these do not aggravate your pain.  Attend physical therapy sessions as directed by your caregiver.  Keep all follow-up appointments as directed by your caregiver.  Do not wear high heels or shoes that do not provide proper support.  Check your mattress to see if it is too soft. A firm mattress may lessen your pain and discomfort. SEEK IMMEDIATE MEDICAL CARE IF:   You lose control of your bowel or bladder (incontinence).  You have increasing weakness in the lower back, pelvis, buttocks, or legs.  You have redness or swelling of your back.  You have a burning sensation when you urinate.  You have pain that gets worse when you lie down or awakens you at night.  Your pain is worse than you have experienced in the past.  Your pain is lasting longer than 4 weeks.  You are suddenly losing weight without reason. MAKE SURE YOU:  Understand these instructions.  Will watch your condition.  Will get help right away if you are not doing well or get worse.   This information is not intended to replace advice given to you by your health care provider. Make sure you discuss any questions you have with your health care provider.   Document Released: 06/17/2001 Document Revised: 03/14/2015 Document Reviewed:  11/02/2011 Elsevier Interactive Patient Education [employer].

## 2015-10-30 NOTE — ED Provider Notes (Signed)
[facility] Emergency Department Provider Note ____________________________________________  Time seen: Approximately 2:14 PM  I have reviewed the triage vital signs and the nursing notes.   HISTORY  Chief Complaint Hip Pain    HPI [name] is a 65 y.o. female who presents to the emergency department for evaluation of left hip pain that radiates down to her toes. He states that the pain has been going on for the past week or so, but has worsened over the past 2 or 3 days. She also states that she has had increasing shortness of breath and wheezing and is using her albuterol inhaler more often. She does have COPD. She is also complaining of a sore throat and chills chills and believes that she had a fever last night.  Past Medical History  Diagnosis Date  . Asthma   . COPD (chronic obstructive pulmonary disease) ([place])   . On home oxygen therapy     hs  . Sleep apnea   . Shortness of breath dyspnea   . Hypertension   . Heart murmur   . Seizures ([place])   . Neuropathy ([place])   . Stroke ([place])     tia  . Cirrhosis, non-alcoholic ([place])   . Hepatitis   . GERD (gastroesophageal reflux disease)   . Deaf   . Depression   . Diabetes mellitus without complication ([place])   . RLS (restless legs syndrome)   . Lymph node disorder     arm  . Orthopnea     Patient Active Problem List   Diagnosis Date Noted  . COPD exacerbation ([place]) 03/31/2015  . Acute respiratory failure with hypoxia ([place]) 03/29/2015    Past Surgical History  Procedure Laterality Date  . Thumb arthroscopy    . Tonsillectomy    . Cesarean section    . Cholecystectomy    . Tympanoplasty      muliple  . Knee arthroplasty    . Cataract extraction w/phaco Right 11/23/2014    Procedure: CATARACT EXTRACTION PHACO AND INTRAOCULAR LENS PLACEMENT (IOC);  Surgeon: Lyla Glassing, MD;  Location: [facility] ORS;  Service: Ophthalmology;  Laterality: Right;  . Cataract extraction w/phaco Left  12/14/2014    Procedure: CATARACT EXTRACTION PHACO AND INTRAOCULAR LENS PLACEMENT (IOC);  Surgeon: Lyla Glassing, MD;  Location: [facility] ORS;  Service: Ophthalmology;  Laterality: Left;  US:01:16.6 AP:15.8 CDE:12.14    Current Outpatient Rx  Name  Route  Sig  Dispense  Refill  . albuterol (PROVENTIL HFA;VENTOLIN HFA) 108 (90 BASE) MCG/ACT inhaler   Inhalation   Inhale 2 puffs into the lungs every 4 (four) hours as needed for wheezing or shortness of breath.         Marland Kitchen albuterol (PROVENTIL) (2.5 MG/3ML) 0.083% nebulizer solution   Nebulization   Take 3 mLs (2.5 mg total) by nebulization every 4 (four) hours as needed for wheezing or shortness of breath.   90 mL   2   . amLODipine (NORVASC) 10 MG tablet   Oral   Take 10 mg by mouth daily.         Marland Kitchen azithromycin (ZITHROMAX) 250 MG tablet      2 tablets today, then 1 tablet for the next 4 days.   6 each   0   . carboxymethylcellulose (REFRESH TEARS) 0.5 % SOLN   Both Eyes   Place 1 drop into both eyes daily as needed.         . citalopram (CELEXA) 20 MG tablet  Oral   Take 20 mg by mouth daily.         . clopidogrel (PLAVIX) 75 MG tablet   Oral   Take 75 mg by mouth daily.         . diphenhydrAMINE (SOMINEX) 25 MG tablet   Oral   Take 25 mg by mouth at bedtime as needed for sleep.         Marland Kitchen doxycycline (VIBRA-TABS) 100 MG tablet   Oral   Take 1 tablet (100 mg total) by mouth every 12 (twelve) hours.   10 tablet   0   . enalapril (VASOTEC) 20 MG tablet   Oral   Take 20 mg by mouth daily.         Marland Kitchen EPINEPHrine 0.3 mg/0.3 mL IJ SOAJ injection   Intramuscular   Inject into the muscle once.         . Fluticasone-Salmeterol (ADVAIR) 500-50 MCG/DOSE AEPB   Inhalation   Inhale 1 puff into the lungs 2 (two) times daily.         Marland Kitchen glipiZIDE (GLUCOTROL XL) 10 MG 24 hr tablet   Oral   Take 10 mg by mouth daily with breakfast.         . hydrochlorothiazide (HYDRODIURIL) 25 MG tablet   Oral   Take 25  mg by mouth daily.         . hydrocortisone 2.5 % cream   Topical   Apply 1 application topically 2 (two) times daily.         Marland Kitchen ipratropium-albuterol (DUONEB) 0.5-2.5 (3) MG/3ML SOLN   Nebulization   Take 3 mLs by nebulization every 6 (six) hours as needed.         . naproxen (NAPROSYN) 500 MG tablet   Oral   Take 500 mg by mouth 2 (two) times daily with a meal.         . omeprazole (PRILOSEC) 20 MG capsule   Oral   Take 20 mg by mouth daily.         Marland Kitchen oxyCODONE (ROXICODONE) 5 MG immediate release tablet   Oral   Take 1 tablet (5 mg total) by mouth every 8 (eight) hours as needed.   20 tablet   0   . potassium chloride (KLOR-CON) 20 MEQ packet   Oral   Take 20 mEq by mouth 2 (two) times daily.         . predniSONE (STERAPRED UNI-PAK 21 TAB) 10 MG (21) TBPK tablet      Take 6 tablets on day 1 Take 5 tablets on day 2 Take 4 tablets on day 3 Take 3 tablets on day 4 Take 2 tablets on day 5 Take 1 tablet on day 6   21 tablet   0   . pregabalin (LYRICA) 150 MG capsule   Oral   Take 150 mg by mouth at bedtime.         Marland Kitchen rOPINIRole (REQUIP) 0.25 MG tablet   Oral   Take 0.25 mg by mouth 3 (three) times daily.         . sennosides-docusate sodium (SENOKOT-S) 8.6-50 MG tablet   Oral   Take 1 tablet by mouth 2 (two) times daily as needed for constipation.           Allergies Aspirin; Celebrex; Ciprofloxacin; Codeine; Fosphenytoin; Levaquin; Penicillins; and Sulfa antibiotics  Family History  Problem Relation Age of Onset  . Lung cancer Mother   . CAD Father  Social History Social History  Substance Use Topics  . Smoking status: Current Every Day Smoker -- 0.50 packs/day  . Smokeless tobacco: None  . Alcohol Use: No    Review of Systems Constitutional: No recent illness. Eyes: No visual changes. ENT: Positive for sore throat. Cardiovascular: Denies chest pain or palpitations. Respiratory: Positive for shortness of breath. Positive  cough. Gastrointestinal: No abdominal pain.  Genitourinary: Negative for dysuria. Musculoskeletal: Pain in left posterior hip with radiation down the posterior left leg. Skin: Negative for rash. Neurological: Negative for headaches, focal weakness. Positive for radiculopathy on the left leg   ____________________________________________   PHYSICAL EXAM:  VITAL SIGNS: ED Triage Vitals  Enc Vitals Group     BP 10/30/15 1158 155/64 mmHg     Pulse Rate 10/30/15 1158 88     Resp 10/30/15 1158 18     Temp 10/30/15 1158 98.7 F (37.1 C)     Temp Source 10/30/15 1158 Oral     SpO2 10/30/15 1158 90 %     Weight 10/30/15 1158 163 lb (73.936 kg)     Height 10/30/15 1158 _0  (1.6 m)     Head Cir --      Peak Flow --      Pain Score 10/30/15 1200 10     Pain Loc --      Pain Edu? --      Excl. in [place]? --     Constitutional: Alert and oriented. Well appearing and in no acute distress. Eyes: Conjunctivae are normal. EOMI. Head: Atraumatic. Nose: No congestion/rhinnorhea. Throat: Erythema without tonsillar edema or exudate. Neck: No stridor.  Respiratory: Normal respiratory effort.   Musculoskeletal: Positive straight leg raise on the left at approximately 40. Normal range of motion of the right. Normal range of motion of bilateral upper extremities. Neurologic:  Normal speech and language. No gross focal neurologic deficits are appreciated.  Skin:  Skin is warm, dry and intact. Atraumatic. Psychiatric: Mood and affect are normal. Speech and behavior are normal.  ____________________________________________   LABS (all labs ordered are listed, but only abnormal results are displayed)  Labs Reviewed - No data to display ____________________________________________  RADIOLOGY  EXAM: CHEST 2 VIEW  COMPARISON: PA and lateral chest 03/29/2015.  FINDINGS: There is new right middle lobe airspace disease. The left lung is clear. Heart size is mildly enlarged. No pneumothorax  or pleural effusion. Aortic atherosclerosis is noted.  IMPRESSION: Right middle lobe airspace disease most consistent with pneumonia. Recommend followup films to clearing. ____________________________________________   PROCEDURES  Procedure(s) performed: None   ____________________________________________   INITIAL IMPRESSION / ASSESSMENT AND PLAN / ED COURSE  Pertinent labs & imaging results that were available during my care of the patient were reviewed by me and considered in my medical decision making (see chart for details).  Back pain with sciatica, right middle lobe pneumonia. Prescribed azithromycin, prednisone, and roxicodone. She is to follow up with her PCP in about 4 weeks to ensure resolution of the pneumonia. She will return to the ER for symptoms that change or worsen if unable to schedule an appointment with her PCP or pulmonologist.  ____________________________________________   FINAL CLINICAL IMPRESSION(S) / ED DIAGNOSES  Final diagnoses:  Left-sided low back pain with left-sided sciatica  Community acquired pneumonia       Victorino Dike, FNP 10/30/15 [id number]  Daymon Larsen, MD 11/01/15 1455

## 2016-03-22 ENCOUNTER — Encounter: Payer: Self-pay | Admitting: Emergency Medicine

## 2016-03-22 ENCOUNTER — Emergency Department: Payer: BLUE CROSS/BLUE SHIELD

## 2016-03-22 ENCOUNTER — Emergency Department
Admission: EM | Admit: 2016-03-22 | Discharge: 2016-03-22 | Payer: BLUE CROSS/BLUE SHIELD | Attending: Emergency Medicine | Admitting: Emergency Medicine

## 2016-03-22 ENCOUNTER — Observation Stay (HOSPITAL_COMMUNITY)
Admission: AD | Admit: 2016-03-22 | Discharge: 2016-03-24 | Disposition: A | Discharge status: Home / Self Care | Payer: BLUE CROSS/BLUE SHIELD | Source: Other Acute Inpatient Hospital | Attending: Family Medicine | Admitting: Family Medicine

## 2016-03-22 DIAGNOSIS — Z9981 Dependence on supplemental oxygen: Secondary | ICD-10-CM | POA: Insufficient documentation

## 2016-03-22 DIAGNOSIS — J441 Chronic obstructive pulmonary disease with (acute) exacerbation: Secondary | ICD-10-CM | POA: Insufficient documentation

## 2016-03-22 DIAGNOSIS — F32A Depression, unspecified: Secondary | ICD-10-CM | POA: Diagnosis present

## 2016-03-22 DIAGNOSIS — G2581 Restless legs syndrome: Secondary | ICD-10-CM | POA: Insufficient documentation

## 2016-03-22 DIAGNOSIS — G40909 Epilepsy, unspecified, not intractable, without status epilepticus: Principal | ICD-10-CM | POA: Insufficient documentation

## 2016-03-22 DIAGNOSIS — F17210 Nicotine dependence, cigarettes, uncomplicated: Secondary | ICD-10-CM | POA: Diagnosis not present

## 2016-03-22 DIAGNOSIS — I10 Essential (primary) hypertension: Secondary | ICD-10-CM | POA: Diagnosis not present

## 2016-03-22 DIAGNOSIS — Z7902 Long term (current) use of antithrombotics/antiplatelets: Secondary | ICD-10-CM | POA: Insufficient documentation

## 2016-03-22 DIAGNOSIS — Z79899 Other long term (current) drug therapy: Secondary | ICD-10-CM | POA: Diagnosis not present

## 2016-03-22 DIAGNOSIS — E1140 Type 2 diabetes mellitus with diabetic neuropathy, unspecified: Secondary | ICD-10-CM | POA: Diagnosis not present

## 2016-03-22 DIAGNOSIS — F329 Major depressive disorder, single episode, unspecified: Secondary | ICD-10-CM | POA: Diagnosis present

## 2016-03-22 DIAGNOSIS — R569 Unspecified convulsions: Secondary | ICD-10-CM | POA: Insufficient documentation

## 2016-03-22 DIAGNOSIS — R269 Unspecified abnormalities of gait and mobility: Secondary | ICD-10-CM

## 2016-03-22 DIAGNOSIS — K7460 Unspecified cirrhosis of liver: Secondary | ICD-10-CM | POA: Insufficient documentation

## 2016-03-22 DIAGNOSIS — E119 Type 2 diabetes mellitus without complications: Secondary | ICD-10-CM | POA: Insufficient documentation

## 2016-03-22 DIAGNOSIS — R05 Cough: Secondary | ICD-10-CM | POA: Diagnosis not present

## 2016-03-22 DIAGNOSIS — N390 Urinary tract infection, site not specified: Secondary | ICD-10-CM | POA: Diagnosis not present

## 2016-03-22 DIAGNOSIS — Z791 Long term (current) use of non-steroidal anti-inflammatories (NSAID): Secondary | ICD-10-CM | POA: Insufficient documentation

## 2016-03-22 DIAGNOSIS — H9190 Unspecified hearing loss, unspecified ear: Secondary | ICD-10-CM | POA: Diagnosis not present

## 2016-03-22 DIAGNOSIS — R791 Abnormal coagulation profile: Secondary | ICD-10-CM | POA: Insufficient documentation

## 2016-03-22 DIAGNOSIS — G4730 Sleep apnea, unspecified: Secondary | ICD-10-CM | POA: Insufficient documentation

## 2016-03-22 DIAGNOSIS — K219 Gastro-esophageal reflux disease without esophagitis: Secondary | ICD-10-CM | POA: Diagnosis not present

## 2016-03-22 DIAGNOSIS — R253 Fasciculation: Secondary | ICD-10-CM | POA: Insufficient documentation

## 2016-03-22 DIAGNOSIS — J9610 Chronic respiratory failure, unspecified whether with hypoxia or hypercapnia: Secondary | ICD-10-CM | POA: Diagnosis not present

## 2016-03-22 DIAGNOSIS — G629 Polyneuropathy, unspecified: Secondary | ICD-10-CM

## 2016-03-22 DIAGNOSIS — F17200 Nicotine dependence, unspecified, uncomplicated: Secondary | ICD-10-CM | POA: Diagnosis not present

## 2016-03-22 DIAGNOSIS — I2510 Atherosclerotic heart disease of native coronary artery without angina pectoris: Secondary | ICD-10-CM | POA: Insufficient documentation

## 2016-03-22 DIAGNOSIS — Z8673 Personal history of transient ischemic attack (TIA), and cerebral infarction without residual deficits: Secondary | ICD-10-CM | POA: Insufficient documentation

## 2016-03-22 DIAGNOSIS — J45909 Unspecified asthma, uncomplicated: Secondary | ICD-10-CM | POA: Diagnosis not present

## 2016-03-22 DIAGNOSIS — R059 Cough, unspecified: Secondary | ICD-10-CM

## 2016-03-22 DIAGNOSIS — G9340 Encephalopathy, unspecified: Secondary | ICD-10-CM

## 2016-03-22 DIAGNOSIS — R531 Weakness: Secondary | ICD-10-CM

## 2016-03-22 LAB — URINALYSIS COMPLETE WITH MICROSCOPIC (ARMC ONLY): Specific Gravity, Urine: 1.016 (ref 1.005–1.030)

## 2016-03-22 LAB — CBC WITH DIFFERENTIAL/PLATELET
BASOS ABS: 0.1 10*3/uL (ref 0–0.1)
BASOS PCT: 1 %
Eosinophils Absolute: 0.1 10*3/uL (ref 0–0.7)
Eosinophils Relative: 1 %
HEMATOCRIT: 46.7 % (ref 35.0–47.0)
HEMOGLOBIN: 16.4 g/dL — AB (ref 12.0–16.0)
Lymphocytes Relative: 27 %
Lymphs Abs: 2.8 10*3/uL (ref 1.0–3.6)
MCH: 28.3 pg (ref 26.0–34.0)
MCHC: 35 g/dL (ref 32.0–36.0)
MCV: 80.8 fL (ref 80.0–100.0)
Monocytes Absolute: 0.8 10*3/uL (ref 0.2–0.9)
Monocytes Relative: 7 %
NEUTROS ABS: 6.9 10*3/uL — AB (ref 1.4–6.5)
NEUTROS PCT: 64 %
Platelets: 303 10*3/uL (ref 150–440)
RBC: 5.78 MIL/uL — AB (ref 3.80–5.20)
RDW: 13.1 % (ref 11.5–14.5)
WBC: 10.7 10*3/uL (ref 3.6–11.0)

## 2016-03-22 LAB — COMPREHENSIVE METABOLIC PANEL
ALT: 25 U/L (ref 14–54)
AST: 40 U/L (ref 15–41)
Albumin: 4 g/dL (ref 3.5–5.0)
Alkaline Phosphatase: 130 U/L — ABNORMAL HIGH (ref 38–126)
Anion gap: 9 (ref 5–15)
BUN: 12 mg/dL (ref 6–20)
CHLORIDE: 95 mmol/L — AB (ref 101–111)
CO2: 28 mmol/L (ref 22–32)
CREATININE: 0.8 mg/dL (ref 0.44–1.00)
Calcium: 9 mg/dL (ref 8.9–10.3)
GFR calc Af Amer: >60 mL/min (ref >60)
Glucose, Bld: 178 mg/dL — ABNORMAL HIGH (ref 65–99)
Potassium: 2.9 mmol/L — ABNORMAL LOW (ref 3.5–5.1)
Sodium: 132 mmol/L — ABNORMAL LOW (ref 135–145)
Total Bilirubin: 0.9 mg/dL (ref 0.3–1.2)
Total Protein: 7 g/dL (ref 6.5–8.1)

## 2016-03-22 LAB — APTT: aPTT: 26 seconds (ref 24–36)

## 2016-03-22 LAB — MAGNESIUM: MAGNESIUM: 1.7 mg/dL (ref 1.7–2.4)

## 2016-03-22 LAB — PROTIME-INR
INR: 0.98
PROTHROMBIN TIME: 13 s (ref 11.4–15.2)

## 2016-03-22 LAB — CK: CK TOTAL: 80 U/L (ref 38–234)

## 2016-03-22 LAB — GLUCOSE, CAPILLARY
Glucose-Capillary: 175 mg/dL — ABNORMAL HIGH (ref 65–99)
Glucose-Capillary: 135 mg/dL — ABNORMAL HIGH (ref 65–99)

## 2016-03-22 MED ORDER — LORAZEPAM 2 MG/ML IJ SOLN
INTRAMUSCULAR | Status: AC
  Filled 2016-03-22: qty 1

## 2016-03-22 MED ORDER — ACETAMINOPHEN 325 MG PO TABS: 650.0000 mg | ORAL_TABLET | Freq: Four times a day (QID) | ORAL | Status: DC | PRN

## 2016-03-22 MED ORDER — SODIUM CHLORIDE 0.9% FLUSH
3.0000 mL | Freq: Two times a day (BID) | INTRAVENOUS | Status: DC
  Administered 2016-03-23 (×2): 3 mL via INTRAVENOUS

## 2016-03-22 MED ORDER — ONDANSETRON HCL 4 MG PO TABS: 4.0000 mg | ORAL_TABLET | Freq: Four times a day (QID) | ORAL | Status: DC | PRN

## 2016-03-22 MED ORDER — LORAZEPAM 2 MG/ML IJ SOLN
INTRAMUSCULAR | Status: AC
  Administered 2016-03-22: 2 mg via INTRAVENOUS
  Filled 2016-03-22: qty 1

## 2016-03-22 MED ORDER — SODIUM CHLORIDE 0.9 % IV BOLUS (SEPSIS)
500.0000 mL | Freq: Once | INTRAVENOUS | Status: AC
Start: 2016-03-22 — End: 2016-03-22
  Administered 2016-03-22: 500 mL via INTRAVENOUS

## 2016-03-22 MED ORDER — HYDROCODONE-ACETAMINOPHEN 5-325 MG PO TABS: 1.0000 | ORAL_TABLET | ORAL | Status: DC | PRN

## 2016-03-22 MED ORDER — INSULIN ASPART 100 UNIT/ML ~~LOC~~ SOLN
0.0000 [IU] | SUBCUTANEOUS | Status: DC
Start: 2016-03-23 — End: 2016-03-24
  Administered 2016-03-23: 1 [IU] via SUBCUTANEOUS
  Administered 2016-03-23: 3 [IU] via SUBCUTANEOUS
  Administered 2016-03-23: 5 [IU] via SUBCUTANEOUS
  Administered 2016-03-23 (×2): 1 [IU] via SUBCUTANEOUS
  Administered 2016-03-23: 5 [IU] via SUBCUTANEOUS
  Administered 2016-03-24 (×3): 2 [IU] via SUBCUTANEOUS

## 2016-03-22 MED ORDER — SODIUM CHLORIDE 0.9 % IV SOLN
1000.0000 mg | Freq: Two times a day (BID) | INTRAVENOUS | Status: DC
  Filled 2016-03-22: qty 10

## 2016-03-22 MED ORDER — LORAZEPAM 2 MG/ML IJ SOLN
2.0000 mg | Freq: Once | INTRAMUSCULAR | Status: AC
  Administered 2016-03-22: 2 mg via INTRAVENOUS

## 2016-03-22 MED ORDER — POTASSIUM CHLORIDE 10 MEQ/100ML IV SOLN
10.0000 meq | Freq: Once | INTRAVENOUS | Status: AC
  Administered 2016-03-22: 10 meq via INTRAVENOUS
  Filled 2016-03-22: qty 100

## 2016-03-22 MED ORDER — ONDANSETRON HCL 4 MG/2ML IJ SOLN: 4.0000 mg | Freq: Four times a day (QID) | INTRAMUSCULAR | Status: DC | PRN

## 2016-03-22 MED ORDER — LORAZEPAM 2 MG/ML IJ SOLN: 2.0000 mg | INTRAMUSCULAR | Status: DC | PRN

## 2016-03-22 MED ORDER — SODIUM CHLORIDE 0.9 % IV SOLN
1500.0000 mg | Freq: Two times a day (BID) | INTRAVENOUS | Status: DC
  Filled 2016-03-22: qty 15

## 2016-03-22 MED ORDER — ACETAMINOPHEN 650 MG RE SUPP: 650.0000 mg | Freq: Four times a day (QID) | RECTAL | Status: DC | PRN

## 2016-03-22 MED ORDER — LEVETIRACETAM 500 MG/5ML IV SOLN
1000.0000 mg | Freq: Once | INTRAVENOUS | Status: AC
  Administered 2016-03-22: 1000 mg via INTRAVENOUS
  Filled 2016-03-22: qty 10

## 2016-03-22 NOTE — Consult Note (Signed)
Neurology Consult Note  Reason for Consultation: Recurrent seizures  Requesting provider: Leonie Man, MD  **patient is deaf; examination was performed with the assistance of a sign language interpreter at the bedside**  CC: Patient is sedated and is not complaining of anything at this time  HPI: This is a 65 year old woman who is admitted in transfer from [facility] for treatment of seizures. History is restricted to the review of the patient's medical record as she is currently sedated and unable to provide any information. No family is present at the bedside at the time of my visit.  The patient presented to the emergency department at [facility] after family suspected that she had an unwitnessed seizure. The patient has apparently complained of feeling a little shaky. There was some concern that she may have had low blood sugars so she ate something. Her family left her briefly and she had a possible seizure. No details are provided regarding this episode apart from the fact that she apparently fell to the floor during the event. Family apparently called 911 and the patient was beginning to wake up for first responders. According to the ED notes, she then had 2 witnessed seizures for EMS, again no description of these episodes. She was given Valium and then transported to the emergency department. They reported that she is currently being treated for a urinary tract infection with Macrobid and Pyridium. They also told ED physician that she has a history of seizures but is currently not taking any seizure medication for reasons unclear.  The ED physician noted that when he entered the room to assess the patient, she "does appear to be seizing she has a upper gaze defect with multiple small fasciculations noted in her arms as well as nystagmus noted in her eyes." She was given 2 mg of Ativan. She had "another very brief episode" that is not described. He notes that  in between these spells and after her spells she was able to wiggle her toes and appeared to follow commands. He ordered a gram of Keppra and obtained CT scan of the head which did not reveal any acute abnormality. During her time in the emergency department, she had "a few more brief episodes of putting her eyes back in her head and focal shaking in her arms but no significant rigidity noted within normal heart rate and no evidence of significant post ictal period." Seizure activity then updated. The patient was discussed with the hospitalist service at aliments but due to the fact that EEG is not available on weekends the patient was transferred to [facility]  I reviewed the patient's chart at length. She had a previous admission to [facility] from 10/18-10/21/14 after she presented with chest pain and recurrent seizures. She was found to have a pneumonia on admission. She was seen by neurology in consultation. According to this note, the patient had 7 events that were felt to be seizures. Her daughter described them as "her head moving back and forth left to right, with her eyes closed but having nystagmus underneath it. She was nonresponsive and then her legs had stiffening and clonic movements." She was treated with Keppra. This note indicates that the patient had a history of seizures that started 30 years prior. She was initially treated with Dilantin but this was switched to [place] subsequently. She now has a listed allergy to Dilantin, though I'm not sure what this allergy consists of. The patient stated that she has an aura  consisting of a sweet smell before her seizures, and sometimes will have this aura without subsequent seizure activity. Her daughter reported that she witnessed staring and unresponsive episodes without any apparent abnormal motor activity after these auras as well. It was felt that her breakthrough events were caused by her pneumonia which resulted in lowered seizure threshold.  There is some suspicion, however, that some of the episodes may have been nonepileptic in nature. She was discharged on Keppra 1000 mg twice daily.  PMH: Limited to chart review as the patient is sedated and unable to provide any history. No family is present. Past Medical History:  Diagnosis Date  . Asthma   . Cirrhosis, non-alcoholic ([place])   . COPD (chronic obstructive pulmonary disease) ([place])   . Deaf   . Depression   . Diabetes mellitus without complication ([place])   . GERD (gastroesophageal reflux disease)   . Heart murmur   . Hepatitis   . Hypertension   . Lymph node disorder    arm  . Neuropathy ([place])   . On home oxygen therapy    hs  . Orthopnea   . RLS (restless legs syndrome)   . Seizures ([place])   . Shortness of breath dyspnea   . Sleep apnea   . Stroke [facility])    tia    PSH: Limited to chart review as the patient is sedated and unable to provide any history. No family is present. Past Surgical History:  Procedure Laterality Date  . CATARACT EXTRACTION W/PHACO Right 11/23/2014   Procedure: CATARACT EXTRACTION PHACO AND INTRAOCULAR LENS PLACEMENT (IOC);  Surgeon: Lyla Glassing, MD;  Location: [facility] ORS;  Service: Ophthalmology;  Laterality: Right;  . CATARACT EXTRACTION W/PHACO Left 12/14/2014   Procedure: CATARACT EXTRACTION PHACO AND INTRAOCULAR LENS PLACEMENT (IOC);  Surgeon: Lyla Glassing, MD;  Location: [facility] ORS;  Service: Ophthalmology;  Laterality: Left;  US:01:16.6 AP:15.8 CDE:12.14  . CESAREAN SECTION    . CHOLECYSTECTOMY    . KNEE ARTHROPLASTY    . THUMB ARTHROSCOPY    . TONSILLECTOMY    . TYMPANOPLASTY     muliple    Family history: Limited to chart review as the patient is sedated and unable to provide any history. No family is present. Family History  Problem Relation Age of Onset  . Lung cancer Mother   . CAD Father     Social history: Limited to chart review as the patient is sedated and unable to provide any history. No family is  present. Social History   Social History  . Marital status: Married    Spouse name: N/A  . Number of children: N/A  . Years of education: N/A   Occupational History  . Not on file.   Social History Main Topics  . Smoking status: Current Every Day Smoker    Packs/day: 0.50  . Smokeless tobacco: Never Used  . Alcohol use No  . Drug use: No  . Sexual activity: Not on file   Other Topics Concern  . Not on file   Social History Narrative  . No narrative on file    Current inpatient meds:  Current Facility-Administered Medications  Medication Dose Route Frequency Provider Last Rate Last Dose  . levETIRAcetam (KEPPRA) 1,500 mg in sodium chloride 0.9 % 100 mL IVPB  1,500 mg Intravenous BID Toy Baker, MD      . LORazepam (ATIVAN) injection 2 mg  2 mg Intravenous Q4H PRN Vianne Bulls, MD  Allergies: Allergies  Allergen Reactions  . Aspirin   . Celebrex [Celecoxib]   . Ciprofloxacin   . Codeine   . Fosphenytoin   . Levaquin [Levofloxacin In D5w]   . Penicillins   . Sulfa Antibiotics     ROS: As per HPI. Review of systems cannot be obtained as the patient is sedated. She would briefly open her eyes but really been no significant attempts to communicate via the sign language interpreter.   PE:   General: WDWN Caucasian woman resting comfortably in bed. She will open her eyes with tactile stimulation though very quickly closes them again. Sustain attention is poor and she appears quite sedated. She does not follow commands. Exam is thus limited largely to observation and reflex testing. HEENT: Normocephalic. Neck supple without LAD. Unable to visualize oropharynx the patient doesn't open her mouth. Sclerae anicteric. No conjunctival injection.  CV: Regular, no murmur. Carotid pulses full and symmetric, no bruits. Distal pulses 2+ and symmetric.  Lungs: CTAB on anterior auscultation.  Abdomen: Soft, obese, non-distended, non-tender. Bowel sounds present x4.   Extremities: No C/C/E. Neuro:  CN: Pupils are equal and round. They are symmetrically reactive from 3-->2 mm. eyes are conjugate. During the brief periods where her eyes open, she appears to have normal spontaneous eye movements. No nystagmus. No forced gaze deviation. Corneals are present and symmetric. Her face appears symmetric at rest and she has a symmetric grimace. The remainder of her cranial nerves cannot be accurately assessed as she does not participate with the examination.  Motor: Normal bulk. Tone is reduced throughout, though this is likely an artifact of sedation. She has some spontaneous movement of all 4 extremities. She does not participate with confrontational strength testing. She did have an episode where she had some low amplitude knotting of the head without any other movements and no associated eye deviation or nystagmus. This was not consistent with seizure activity. Sensation: She opens her eyes, grimaces, and has purposeful withdrawal to noxious stimulation 4..  DTRs: 3+, symmetric. Toes mute on the right, downgoing on the left. Hoffmann's is absent bilaterally.  Coordination and gait: These cannot be assessed at this time as the patient is unable to participate with the examination.   Labs:  Lab Results  Component Value Date   WBC 10.7 03/22/2016   HGB 16.4 (H) 03/22/2016   HCT 46.7 03/22/2016   PLT 303 03/22/2016   GLUCOSE 178 (H) 03/22/2016   ALT 25 03/22/2016   AST 40 03/22/2016   NA 132 (L) 03/22/2016   K 2.9 (L) 03/22/2016   CL 95 (L) 03/22/2016   CREATININE 0.80 03/22/2016   BUN 12 03/22/2016   CO2 28 03/22/2016   INR 0.98 03/22/2016   HGBA1C 7.2 (H) 03/29/2015   Urinalysis was nondiagnostic due to increased urine pigment. Magnesium 1.7  Imaging:  I have personally and independently reviewed CT scan of the head without contrast from 03/22/16. This shows mild diffuse generalized atrophy. No acute abnormality is noted.  Assessment and Plan:  1.  Seizures: The patient has had multiple spells this afternoon. On reviewing notes and prior admissions, she has had spells of several different semiologies documented. While she certainly may have epileptic seizures, there has been some concern in the past that at least some of her spells may be nonepileptic in etiology. It is certainly possible that she did have both epileptic and nonepileptic events. Today's spells may represent breakthrough seizure activity in the setting of a urinary tract infection.  She is currently not demonstrating any activity concerning for seizure on my examination. It appears that she has been given Valium, Ativan, and 1 g of Keppra prior to her arrival at this hospital and she is now sedated as a result. I'm not sure why she is no longer taking Keppra at home but this will be restarted here in the hospital at 1000 mg twice daily as this seems to have been an effective dose for her in the past, at least as far as I can tell from the records. EEG will be obtained. Seizure precautions.  2. Acute encephalopathy: At this point, this appears to be largely due to sedation from medications including Valium, Ativan, and Keppra. There may be a contribution from postictal state as well, though according to ED physician notes she did not appear to be particularly postictal after the spells witnessed there. Minimize sedating medications to the greatest extent possible. Optimize metabolic status and continue to treat underlying infection.  Thank you for this consultation. Neurology will continue to follow.

## 2016-03-22 NOTE — ED Triage Notes (Signed)
Pt arrived via EMS from home after unwitnessed seizure at home. Pt is deaf. Daughter at bedside. Patient having episodes of twitching in her head and feet.  Pt has NRB applied.  Per daughter pt has a hx of seizures but has been since over a year since her last. She is currently on Gabapentin for neuropathy but no other seizure medications.  Pt currently under treatment for UTI with macrobid and pyridium.

## 2016-03-22 NOTE — ED Notes (Signed)
Patient has had 4-5 periods since arrival of twitching in her head and feet for about 2-3 minutes then will stop and open her eyes spontaneously.  Patient unable to communicate as she is deaf but can follow simple commands such as moving her toes.  Dr. Burlene Arnt aware and witnessed patient during these seizure-like episodes.  Pt is able to maintain her airway at this time and has NRB applied.

## 2016-03-22 NOTE — ED Notes (Signed)
Potassium chloride infusion rate decreased to 50 mL/hr to avoid burning.

## 2016-03-22 NOTE — H&P (Signed)
[name] KDX:[id number] DOB: 12-07-1950 DOA: 03/22/2016     PCP: Donnie Coffin, MD    Patient coming from:    home Lives   With family  Chief Complaint: seizure activity  HPI: [name] is a 65 y.o. female with medical history significant of seizure disorder   And deafness  Presented with sudden onset of seizure while her daughter stepped out she found her mother on the floor. Prior to this patient reports she was feeling a little shaky and she was worried that her glucose was too low. When EMS arrived she started to wake up and then had another 2 weakness seizures per EMS they gave her Valium. Patient states she is not usually taking any seizure medications. She hadn't had a seizure for many years. Of note recently she was treated for urinary tract infection with Macrobid and Pyridium. Patient originally presented to [place] but secondary to recurrent seizure events while in emergency department was transferred to [facility] for neurology consult. The seizure episodes witnessed by E emergency department was brief episodes of putting her eyes back in her head and focal shaking of her arms no significant rigidity no postictal state. She may have had similar episodes in 2014 in emergency department her chest x-ray did not show any evidence of infiltrates neurologically patient remained intact and alert in in between of episodes  family states she has hd some cough after they did extensive cleaning today in the house using chemicals. Patient has hx of COPD and sleep apnea uses oxygen 2 L at night time She is currently oversedated unable to provide her own history  IN ER:  Temp (24hrs), Avg:97.3 F (36.3 C), Min:97.3 F (36.3 C), Max:97.3 F (36.3 C) Heart rate 59 blood pressure 127/49 down to 91/35 glucose 135  Following Medications were ordered in ER: Medications  LORazepam (ATIVAN) injection 2 mg (not administered)       Hospitalist was called for admission for  Recurrent seizure-like activity of unclear etiology  Review of Systems:    Pertinent positives include: Seizure-like activity dysuria  Constitutional:  No weight loss, night sweats, Fevers, chills, fatigue, weight loss  HEENT:  No headaches, Difficulty swallowing,Tooth/dental problems,Sore throat,  No sneezing, itching, ear ache, nasal congestion, post nasal drip,  Cardio-vascular:  No chest pain, Orthopnea, PND, anasarca, dizziness, palpitations.no Bilateral lower extremity swelling  GI:  No heartburn, indigestion, abdominal pain, nausea, vomiting, diarrhea, change in bowel habits, loss of appetite, melena, blood in stool, hematemesis Resp:  no shortness of breath at rest. No dyspnea on exertion, No excess mucus, no productive cough, No non-productive cough, No coughing up of blood.No change in color of mucus.No wheezing. Skin:  no rash or lesions. No jaundice GU:  no dysuria, change in color of urine, no urgency or frequency. No straining to urinate.  No flank pain.  Musculoskeletal:  No joint pain or no joint swelling. No decreased range of motion. No back pain.  Psych:  No change in mood or affect. No depression or anxiety. No memory loss.  Neuro: no localizing neurological complaints, no tingling, no weakness, no double vision, no gait abnormality, no slurred speech, no confusion  As per HPI otherwise 10 point review of systems negative.   Past Medical History: Past Medical History:  Diagnosis Date  . Asthma   . Cirrhosis, non-alcoholic ([place])   . COPD (chronic obstructive pulmonary disease) ([place])   . Deaf   . Depression   . Diabetes mellitus  without complication ([place])   . GERD (gastroesophageal reflux disease)   . Heart murmur   . Hepatitis   . Hypertension   . Lymph node disorder    arm  . Neuropathy ([place])   . On home oxygen therapy    hs  . Orthopnea   . RLS (restless legs syndrome)   . Seizures ([place])   . Shortness of breath dyspnea   . Sleep apnea   .  Stroke [facility])    tia   Past Surgical History:  Procedure Laterality Date  . CATARACT EXTRACTION W/PHACO Right 11/23/2014   Procedure: CATARACT EXTRACTION PHACO AND INTRAOCULAR LENS PLACEMENT (IOC);  Surgeon: Lyla Glassing, MD;  Location: [facility] ORS;  Service: Ophthalmology;  Laterality: Right;  . CATARACT EXTRACTION W/PHACO Left 12/14/2014   Procedure: CATARACT EXTRACTION PHACO AND INTRAOCULAR LENS PLACEMENT (IOC);  Surgeon: Lyla Glassing, MD;  Location: [facility] ORS;  Service: Ophthalmology;  Laterality: Left;  US:01:16.6 AP:15.8 CDE:12.14  . CESAREAN SECTION    . CHOLECYSTECTOMY    . KNEE ARTHROPLASTY    . THUMB ARTHROSCOPY    . TONSILLECTOMY    . TYMPANOPLASTY     muliple     Social History:  Ambulatory   Independently     reports that she has been smoking.  She has been smoking about 0.50 packs per day. She has never used smokeless tobacco. She reports that she does not drink alcohol or use drugs.  Allergies:   Allergies  Allergen Reactions  . Aspirin   . Celebrex [Celecoxib]   . Ciprofloxacin   . Codeine   . Fosphenytoin   . Levaquin [Levofloxacin In D5w]   . Penicillins   . Sulfa Antibiotics        Family History:   Family History  Problem Relation Age of Onset  . Lung cancer Mother   . CAD Father     Medications: Prior to Admission medications   Medication Sig Start Date End Date Taking? Authorizing Provider  albuterol (PROVENTIL HFA;VENTOLIN HFA) 108 (90 BASE) MCG/ACT inhaler Inhale 2 puffs into the lungs every 4 (four) hours as needed for wheezing or shortness of breath.    Historical Provider, MD  albuterol (PROVENTIL) (2.5 MG/3ML) 0.083% nebulizer solution Take 3 mLs (2.5 mg total) by nebulization every 4 (four) hours as needed for wheezing or shortness of breath. 03/31/15   Srikar Sudini, MD  amLODipine (NORVASC) 10 MG tablet Take 10 mg by mouth daily.    Historical Provider, MD  azithromycin (ZITHROMAX) 250 MG tablet 2 tablets today, then 1 tablet for  the next 4 days. 10/30/15   Victorino Dike, FNP  carboxymethylcellulose (REFRESH TEARS) 0.5 % SOLN Place 1 drop into both eyes daily as needed.    Historical Provider, MD  citalopram (CELEXA) 20 MG tablet Take 20 mg by mouth daily.    Historical Provider, MD  clopidogrel (PLAVIX) 75 MG tablet Take 75 mg by mouth daily.    Historical Provider, MD  diphenhydrAMINE (SOMINEX) 25 MG tablet Take 25 mg by mouth at bedtime as needed for sleep.    Historical Provider, MD  doxycycline (VIBRA-TABS) 100 MG tablet Take 1 tablet (100 mg total) by mouth every 12 (twelve) hours. 03/31/15   Srikar Sudini, MD  enalapril (VASOTEC) 20 MG tablet Take 20 mg by mouth daily.    Historical Provider, MD  EPINEPHrine 0.3 mg/0.3 mL IJ SOAJ injection Inject into the muscle once.    Historical Provider, MD  Fluticasone-Salmeterol (ADVAIR) 500-50 MCG/DOSE AEPB  Inhale 1 puff into the lungs 2 (two) times daily.    Historical Provider, MD  glipiZIDE (GLUCOTROL XL) 10 MG 24 hr tablet Take 10 mg by mouth daily with breakfast.    Historical Provider, MD  hydrochlorothiazide (HYDRODIURIL) 25 MG tablet Take 25 mg by mouth daily.    Historical Provider, MD  hydrocortisone 2.5 % cream Apply 1 application topically 2 (two) times daily.    Historical Provider, MD  ipratropium-albuterol (DUONEB) 0.5-2.5 (3) MG/3ML SOLN Take 3 mLs by nebulization every 6 (six) hours as needed.    Historical Provider, MD  naproxen (NAPROSYN) 500 MG tablet Take 500 mg by mouth 2 (two) times daily with a meal.    Historical Provider, MD  omeprazole (PRILOSEC) 20 MG capsule Take 20 mg by mouth daily.    Historical Provider, MD  oxyCODONE (ROXICODONE) 5 MG immediate release tablet Take 1 tablet (5 mg total) by mouth every 8 (eight) hours as needed. 10/30/15 10/29/16  Victorino Dike, FNP  potassium chloride (KLOR-CON) 20 MEQ packet Take 20 mEq by mouth 2 (two) times daily.    Historical Provider, MD  predniSONE (STERAPRED UNI-PAK 21 TAB) 10 MG (21) TBPK tablet Take 6  tablets on day 1 Take 5 tablets on day 2 Take 4 tablets on day 3 Take 3 tablets on day 4 Take 2 tablets on day 5 Take 1 tablet on day 6 10/30/15   Cari B Triplett, FNP  pregabalin (LYRICA) 150 MG capsule Take 150 mg by mouth at bedtime.    Historical Provider, MD  rOPINIRole (REQUIP) 0.25 MG tablet Take 0.25 mg by mouth 3 (three) times daily.    Historical Provider, MD  sennosides-docusate sodium (SENOKOT-S) 8.6-50 MG tablet Take 1 tablet by mouth 2 (two) times daily as needed for constipation.    Historical Provider, MD    Physical Exam: No data found.   1. General:  in No Acute distress 2. Psychological: Alert and  Oriented 3. Head/ENT:    Dry Mucous Membranes                          Head Non traumatic, neck supple                          Normal   Dentition 4. SKIN:   decreased Skin turgor,  Skin clean Dry and intact no rash 5. Heart: Regular rate and rhythm no  Murmur, Rub or gallop 6. Lungs:  Clear to auscultation bilaterally, no wheezes or crackles   7. Abdomen: Soft,  non-tender, Non distended 8. Lower extremities: no clubbing, cyanosis, or edema 9. Neurologically  strength 5 out of 5 in all 4 extremities cranial nerves II through XII intact 10. MSK: Normal range of motion   body mass index is unknown because there is no height or weight on file.  Labs on Admission:   Labs on Admission: I have personally reviewed following labs and imaging studies  CBC:  Recent Labs Lab 03/22/16 1559  WBC 10.7  NEUTROABS 6.9*  HGB 16.4*  HCT 46.7  MCV 80.8  PLT 009   Basic Metabolic Panel:  Recent Labs Lab 03/22/16 1559  NA 132*  K 2.9*  CL 95*  CO2 28  GLUCOSE 178*  BUN 12  CREATININE 0.80  CALCIUM 9.0  MG 1.7   GFR: Estimated Creatinine Clearance: 69.3 mL/min (by C-G formula based on SCr of 0.8 mg/dL). Liver Function  Tests:  Recent Labs Lab 03/22/16 1559  AST 40  ALT 25  ALKPHOS 130*  BILITOT 0.9  PROT 7.0  ALBUMIN 4.0   No results for input(s):  LIPASE, AMYLASE in the last 168 hours. No results for input(s): AMMONIA in the last 168 hours. Coagulation Profile:  Recent Labs Lab 03/22/16 1559  INR 0.98   Cardiac Enzymes:  Recent Labs Lab 03/22/16 1559  CKTOTAL 80   BNP (last 3 results) No results for input(s): PROBNP in the last 8760 hours. HbA1C: No results for input(s): HGBA1C in the last 72 hours. CBG:  Recent Labs Lab 03/22/16 1601  GLUCAP 175*   Lipid Profile: No results for input(s): CHOL, HDL, LDLCALC, TRIG, CHOLHDL, LDLDIRECT in the last 72 hours. Thyroid Function Tests: No results for input(s): TSH, T4TOTAL, FREET4, T3FREE, THYROIDAB in the last 72 hours. Anemia Panel: No results for input(s): VITAMINB12, FOLATE, FERRITIN, TIBC, IRON, RETICCTPCT in the last 72 hours. Urine analysis:    Component Value Date/Time   COLORURINE ORANGE (A) 03/22/2016 1707   APPEARANCEUR HAZY (A) 03/22/2016 1707   APPEARANCEUR Clear 11/14/2011 1607   LABSPEC 1.016 03/22/2016 1707   LABSPEC 1.006 11/14/2011 1607   PHURINE  03/22/2016 1707    TEST NOT REPORTED DUE TO COLOR INTERFERENCE OF URINE PIGMENT   GLUCOSEU (A) 03/22/2016 1707    TEST NOT REPORTED DUE TO COLOR INTERFERENCE OF URINE PIGMENT   GLUCOSEU Negative 11/14/2011 1607   HGBUR (A) 03/22/2016 1707    TEST NOT REPORTED DUE TO COLOR INTERFERENCE OF URINE PIGMENT   BILIRUBINUR (A) 03/22/2016 1707    TEST NOT REPORTED DUE TO COLOR INTERFERENCE OF URINE PIGMENT   BILIRUBINUR Negative 11/14/2011 1607   KETONESUR (A) 03/22/2016 1707    TEST NOT REPORTED DUE TO COLOR INTERFERENCE OF URINE PIGMENT   PROTEINUR (A) 03/22/2016 1707    TEST NOT REPORTED DUE TO COLOR INTERFERENCE OF URINE PIGMENT   NITRITE (A) 03/22/2016 1707    TEST NOT REPORTED DUE TO COLOR INTERFERENCE OF URINE PIGMENT   LEUKOCYTESUR (A) 03/22/2016 1707    TEST NOT REPORTED DUE TO COLOR INTERFERENCE OF URINE PIGMENT   LEUKOCYTESUR Negative 11/14/2011 1607   Sepsis  Labs: _0 (procalcitonin:4,lacticidven:4) )No results found for this or any previous visit (from the past 240 hour(s)).    UA unAble to interpret  Lab Results  Component Value Date   HGBA1C 7.2 (H) 03/29/2015    Estimated Creatinine Clearance: 69.3 mL/min (by C-G formula based on SCr of 0.8 mg/dL).  BNP (last 3 results) No results for input(s): PROBNP in the last 8760 hours.   ECG REPORT  Independently reviewed Rate: 67  Rhythm: Sinus rhythm ST&T Change: No acute ischemic changes   QTC 481  There were no vitals filed for this visit.   Cultures: No results found for: SDES, SPECREQUEST, CULT, REPTSTATUS   Radiological Exams on Admission: Ct Head Wo Contrast  Result Date: 03/22/2016 CLINICAL DATA:  Patient found on floor.  Seizure like activity. EXAM: CT HEAD WITHOUT CONTRAST TECHNIQUE: Contiguous axial images were obtained from the base of the skull through the vertex without intravenous contrast. COMPARISON:  Brain CT 04/23/2013 FINDINGS: Brain: Ventricles and sulci are appropriate for patient's age. No evidence for acute cortically based infarct, intracranial hemorrhage, mass lesion or mass-effect. Vascular: No hyperdense vessel or unexpected calcification. Skull: Postsurgical changes within the left mastoid. Calvarium is intact. Paranasal sinuses are unremarkable. Sinuses/Orbits: Paranasal sinuses unremarkable. Orbits unremarkable. Other: None. IMPRESSION: No acute intracranial process. Electronically Signed  By: Lovey Newcomer M.D.   On: 03/22/2016 16:44   Dg Chest Port 1 View  Result Date: 03/22/2016 CLINICAL DATA:  Seizure, smoker, history asthma, COPD, diabetes mellitus, hepatitis, hypertension, stroke EXAM: PORTABLE CHEST 1 VIEW COMPARISON:  Portable exam 1700 hours compared to 10/30/2015 FINDINGS: Borderline enlargement of cardiac silhouette. Atherosclerotic calcification aorta. Slightly prominent central pulmonary arteries unchanged. Subsegmental atelectasis RIGHT  base. Lungs otherwise clear. No pleural effusion or pneumothorax. Bones demineralized. IMPRESSION: Subsegmental atelectasis at RIGHT base. Aortic atherosclerosis. Electronically Signed   By: Lavonia [place] M.D.   On: 03/22/2016 17:22    Chart has been reviewed    Assessment/Plan  65 y.o. female with medical history significant of seizure disorder ,HTN, DM 2, neuropathy, sleep apnea, COPD  And deafness being admitted for recurrent seizure-like activity and possible COPD exacerbation   Present on Admission:  Possible seizure - appreciate neurology consult possible breakthrough seizure versus pseudoseizure. Continue Keppra thousand milligrams twice a day order EEG seizure precautions Acute encephalopathy in the setting of polypharmacy versus being postictal would avoid oversedation. Obtain ABG given hx of COPD Recent urinary tract infection current UA nondiagnostic secondary to orange discoloration patient currently asymptomatic will continue to monitor with results of urine culture . Essential hypertension stable continue home medications Other plan as per orders.   COPD exacerbation - stable continue home medications,  - Will initiate Steroid taper, antibiotics, Albuterol PRN, scheduled Atrovent, Dulera and Mucinex. Titrate O2 to saturation >90%. Follow patients respiratory status.  Sleep apnea continue home oxygen Diabetes mellitus -  we'll order sliding scale   DVT prophylaxis:  SCD   Code Status:   DNI    Family Communication:   Family not  at  Bedside    Disposition Plan:        To home once workup is complete and patient is stable                       Would benefit from PT/OT eval prior to DC   ordered                         Consults called: Neurology consulted   Admission status:    Observation  Level of care         SDU      I have spent a total of 65 min on this admission   Mickelle Goupil 03/23/2016, 1:14 AM    [facility]  Pager [phone]   after 2 AM  please page floor coverage PA If 7AM-7PM, please contact the day team taking care of the patient  [email or web address]  Password [email or web address]

## 2016-03-22 NOTE — ED Notes (Signed)
Returned from CT.

## 2016-03-22 NOTE — ED Notes (Signed)
Report to [place], Therapist, sports at [facility], [phone]

## 2016-03-22 NOTE — ED Notes (Signed)
Second call for report att to [place], left msg for RN to call this RN back

## 2016-03-22 NOTE — ED Notes (Signed)
Report received from [place], [place]

## 2016-03-22 NOTE — ED Notes (Signed)
Pt alert at this time, waving her hand at her family, attempting to communicate in sign language to family. Pt is able to read lips.

## 2016-03-22 NOTE — ED Notes (Signed)
Kenney Houseman Jenkins-patient's daughter [phone] if needed to be reached.

## 2016-03-22 NOTE — ED Notes (Signed)
[phone] report to Jennye Moccasin, RN

## 2016-03-22 NOTE — ED Provider Notes (Addendum)
[facility] Emergency Department Provider Note  ____________________________________________   I have reviewed the triage vital signs and the nursing notes.   HISTORY  Chief Complaint Seizures    HPI [name] is a 65 y.o. female with a history of seizure disorder who has not taking any antiseizure medications. Has not had a seizure and "years". Does not drink alcohol. He is under treatment for urinary tract infection according to family, felt a little shaky and medially prior to coming in and they ate lunch taking that perhaps her sugar was low, family then stepped out and she had a unwitnessed seizure on a hard floor. Patient started to wake up for firemen and then had 2 witnessed seizures for EMS after which they gave her Valium. Patient herself cannot give a history. She is at baseline deaf. Family states that she has been under her normal state of health aside from a urinary tract infection which she is taking Macrobid for. They are not sure why she is not on any seizure medication or if she was told to stop it. She does take gabapentin for her restless leg syndrome but is unclear if that is still being taken. She has an allergy to phenytoin. Patient herself cannot give me history.Level 5 chart caveat; no further history available due to patient status.     Past Medical History:  Diagnosis Date  . Asthma   . Cirrhosis, non-alcoholic ([place])   . COPD (chronic obstructive pulmonary disease) ([place])   . Deaf   . Depression   . Diabetes mellitus without complication ([place])   . GERD (gastroesophageal reflux disease)   . Heart murmur   . Hepatitis   . Hypertension   . Lymph node disorder    arm  . Neuropathy ([place])   . On home oxygen therapy    hs  . Orthopnea   . RLS (restless legs syndrome)   . Seizures ([place])   . Shortness of breath dyspnea   . Sleep apnea   . Stroke [facility])    tia    Patient Active Problem List   Diagnosis Date Noted  . COPD  exacerbation ([place]) 03/31/2015  . Acute respiratory failure with hypoxia ([place]) 03/29/2015    Past Surgical History:  Procedure Laterality Date  . CATARACT EXTRACTION W/PHACO Right 11/23/2014   Procedure: CATARACT EXTRACTION PHACO AND INTRAOCULAR LENS PLACEMENT (IOC);  Surgeon: Lyla Glassing, MD;  Location: [facility] ORS;  Service: Ophthalmology;  Laterality: Right;  . CATARACT EXTRACTION W/PHACO Left 12/14/2014   Procedure: CATARACT EXTRACTION PHACO AND INTRAOCULAR LENS PLACEMENT (IOC);  Surgeon: Lyla Glassing, MD;  Location: [facility] ORS;  Service: Ophthalmology;  Laterality: Left;  US:01:16.6 AP:15.8 CDE:12.14  . CESAREAN SECTION    . CHOLECYSTECTOMY    . KNEE ARTHROPLASTY    . THUMB ARTHROSCOPY    . TONSILLECTOMY    . TYMPANOPLASTY     muliple    Prior to Admission medications   Medication Sig Start Date End Date Taking? Authorizing Provider  albuterol (PROVENTIL HFA;VENTOLIN HFA) 108 (90 BASE) MCG/ACT inhaler Inhale 2 puffs into the lungs every 4 (four) hours as needed for wheezing or shortness of breath.    Historical Provider, MD  albuterol (PROVENTIL) (2.5 MG/3ML) 0.083% nebulizer solution Take 3 mLs (2.5 mg total) by nebulization every 4 (four) hours as needed for wheezing or shortness of breath. 03/31/15   Srikar Sudini, MD  amLODipine (NORVASC) 10 MG tablet Take 10 mg by mouth daily.    Historical  Provider, MD  azithromycin (ZITHROMAX) 250 MG tablet 2 tablets today, then 1 tablet for the next 4 days. 10/30/15   Victorino Dike, FNP  carboxymethylcellulose (REFRESH TEARS) 0.5 % SOLN Place 1 drop into both eyes daily as needed.    Historical Provider, MD  citalopram (CELEXA) 20 MG tablet Take 20 mg by mouth daily.    Historical Provider, MD  clopidogrel (PLAVIX) 75 MG tablet Take 75 mg by mouth daily.    Historical Provider, MD  diphenhydrAMINE (SOMINEX) 25 MG tablet Take 25 mg by mouth at bedtime as needed for sleep.    Historical Provider, MD  doxycycline (VIBRA-TABS) 100 MG tablet Take  1 tablet (100 mg total) by mouth every 12 (twelve) hours. 03/31/15   Srikar Sudini, MD  enalapril (VASOTEC) 20 MG tablet Take 20 mg by mouth daily.    Historical Provider, MD  EPINEPHrine 0.3 mg/0.3 mL IJ SOAJ injection Inject into the muscle once.    Historical Provider, MD  Fluticasone-Salmeterol (ADVAIR) 500-50 MCG/DOSE AEPB Inhale 1 puff into the lungs 2 (two) times daily.    Historical Provider, MD  glipiZIDE (GLUCOTROL XL) 10 MG 24 hr tablet Take 10 mg by mouth daily with breakfast.    Historical Provider, MD  hydrochlorothiazide (HYDRODIURIL) 25 MG tablet Take 25 mg by mouth daily.    Historical Provider, MD  hydrocortisone 2.5 % cream Apply 1 application topically 2 (two) times daily.    Historical Provider, MD  ipratropium-albuterol (DUONEB) 0.5-2.5 (3) MG/3ML SOLN Take 3 mLs by nebulization every 6 (six) hours as needed.    Historical Provider, MD  naproxen (NAPROSYN) 500 MG tablet Take 500 mg by mouth 2 (two) times daily with a meal.    Historical Provider, MD  omeprazole (PRILOSEC) 20 MG capsule Take 20 mg by mouth daily.    Historical Provider, MD  oxyCODONE (ROXICODONE) 5 MG immediate release tablet Take 1 tablet (5 mg total) by mouth every 8 (eight) hours as needed. 10/30/15 10/29/16  Victorino Dike, FNP  potassium chloride (KLOR-CON) 20 MEQ packet Take 20 mEq by mouth 2 (two) times daily.    Historical Provider, MD  predniSONE (STERAPRED UNI-PAK 21 TAB) 10 MG (21) TBPK tablet Take 6 tablets on day 1 Take 5 tablets on day 2 Take 4 tablets on day 3 Take 3 tablets on day 4 Take 2 tablets on day 5 Take 1 tablet on day 6 10/30/15   Cari B Triplett, FNP  pregabalin (LYRICA) 150 MG capsule Take 150 mg by mouth at bedtime.    Historical Provider, MD  rOPINIRole (REQUIP) 0.25 MG tablet Take 0.25 mg by mouth 3 (three) times daily.    Historical Provider, MD  sennosides-docusate sodium (SENOKOT-S) 8.6-50 MG tablet Take 1 tablet by mouth 2 (two) times daily as needed for constipation.     Historical Provider, MD    Allergies Aspirin; Celebrex [celecoxib]; Ciprofloxacin; Codeine; Fosphenytoin; Levaquin [levofloxacin in d5w]; Penicillins; and Sulfa antibiotics  Family History  Problem Relation Age of Onset  . Lung cancer Mother   . CAD Father     Social History Social History  Substance Use Topics  . Smoking status: Current Every Day Smoker    Packs/day: 0.50  . Smokeless tobacco: Not on file  . Alcohol use No    Review of Systems Level 5 chart caveat; no further history available due to patient status.  ____________________________________________   PHYSICAL EXAM:  VITAL SIGNS: ED Triage Vitals  Enc Vitals Group  BP      Pulse      Resp      Temp      Temp src      SpO2      Weight      Height      Head Circumference      Peak Flow      Pain Score      Pain Loc      Pain Edu?      Excl. in [place]?     Constitutional: Upon my entrance to the room patient does appear to be seizing she has a upper gaze defect with multiple small fasciculations noted in her arms as well as nystagmus noted in her eyes. Eyes: Conjunctivae are normal. PERRL. EOMI. Head: Atraumatic. Nose: No congestion/rhinnorhea. Mouth/Throat: Mucous membranes are moist.  Oropharynx non-erythematous. Neck: No stridor.   Nontender with no meningismus Cardiovascular: Normal rate, regular rhythm. Grossly normal heart sounds.  Good peripheral circulation. Respiratory: Normal respiratory effort.  No retractions. Lungs CTAB. Abdominal: After seizure: Soft and nontender. No distention. No guarding no rebound Musculoskeletal: After seizure: No lower extremity tenderness, no upper extremity tenderness. No joint effusions, no DVT signs strong distal pulses no edema Neurologic: After seizure: Normal speech and language. No gross focal neurologic deficits are appreciated.  Skin:  Skin is warm, dry and intact. No rash noted. Psychiatric: Mood and affect are normal. Speech and behavior are  normal.  ____________________________________________   LABS (all labs ordered are listed, but only abnormal results are displayed)  Labs Reviewed  GLUCOSE, CAPILLARY - Abnormal; Notable for the following:       Result Value   Glucose-Capillary 175 (*)    All other components within normal limits  COMPREHENSIVE METABOLIC PANEL  URINALYSIS COMPLETEWITH MICROSCOPIC ([facility] ONLY)  CBC WITH DIFFERENTIAL/PLATELET  CK  PROTIME-INR  APTT  CBG MONITORING, ED   ____________________________________________  EKG  I personally interpreted any EKGs ordered by me or triage Normal sinus rhythm at 67 bpm no acute ST elevation or acute ST depression normal axis nonspecific ST changes ____________________________________________  RADIOLOGY  I reviewed any imaging ordered by me or triage that were performed during my shift and, if possible, patient and/or family made aware of any abnormal findings. ____________________________________________   PROCEDURES  Procedure(s) performed: None  Procedures  Critical Care performed: None  ____________________________________________   INITIAL IMPRESSION / ASSESSMENT AND PLAN / ED COURSE  Pertinent labs & imaging results that were available during my care of the patient were reviewed by me and considered in my medical decision making (see chart for details).  Patient seemed to be having a seizure upon arrival, I did give HER-2 of Ativan she had another very brief episode and then that resolved. In between and after seizure she was able to wiggle her toes and seem to follow commands. She does not appear to be focal to the extent that limited exam as possible and her postictal condition. We are ordering a gram of Keppra to load the patient as she is allergic to phenytoin, and we will obtain a CT scan of the head as it is been sometime since her last seizure and she also may or may not actually hit her head during the initial seizure. We are this is a  seizure precautions. Her blood glucose is reassuring. We will keep a eye on her here. Patient likely will merit admission given serial seizures.  ----------------------------------------- 5:28 PM on 03/22/2016 -----------------------------------------  She has had a  few more brief episodes of putting her eyes back in her head and focal shaking in her arms but no significant rigidity noted within normal heart rate and no evidence of significant postictal period. This only could be atypical seizures or psychogenic seizures. They are similar to seizures described in 2014 the last time she was here with seizures at which time she had a pneumonia. We are awaiting her urine. Patient chest x-ray does not show pneumonia in her lungs are clear at this time. In between these episodes of which are the nephew, and at this very moment, patient is awake alert at her baseline following commands and nonfocal. We did discuss with the hospitalist service here as there is no weak and EEG service they've recommended treatment at [facility] and we'll call them.    ----------------------------------------- 7:22 PM on 03/22/2016 -----------------------------------------  Patient has suffered no ongoing seizure activity in the emergency room. She is sleeping comfortably and will arouse. No obvious focality noted. [facility] was generous enough to take the patient in transfer.   Clinical Course   ____________________________________________   FINAL CLINICAL IMPRESSION(S) / ED DIAGNOSES  Final diagnoses:  None      This chart was dictated using voice recognition software.  Despite best efforts to proofread,  errors can occur which can change meaning.      Schuyler Amor, MD 03/22/16 [place], MD 03/22/16 [place], MD 03/22/16 1806    Schuyler Amor, MD 03/22/16 [phone]

## 2016-03-22 NOTE — ED Notes (Signed)
Daughter called regarding pt status, states on the way to [address]

## 2016-03-23 DIAGNOSIS — G629 Polyneuropathy, unspecified: Secondary | ICD-10-CM

## 2016-03-23 DIAGNOSIS — F329 Major depressive disorder, single episode, unspecified: Secondary | ICD-10-CM

## 2016-03-23 DIAGNOSIS — E119 Type 2 diabetes mellitus without complications: Secondary | ICD-10-CM | POA: Diagnosis not present

## 2016-03-23 DIAGNOSIS — F32A Depression, unspecified: Secondary | ICD-10-CM | POA: Diagnosis present

## 2016-03-23 DIAGNOSIS — G40909 Epilepsy, unspecified, not intractable, without status epilepticus: Secondary | ICD-10-CM | POA: Diagnosis not present

## 2016-03-23 DIAGNOSIS — I10 Essential (primary) hypertension: Secondary | ICD-10-CM

## 2016-03-23 DIAGNOSIS — R569 Unspecified convulsions: Secondary | ICD-10-CM | POA: Diagnosis not present

## 2016-03-23 DIAGNOSIS — J441 Chronic obstructive pulmonary disease with (acute) exacerbation: Secondary | ICD-10-CM

## 2016-03-23 DIAGNOSIS — E1142 Type 2 diabetes mellitus with diabetic polyneuropathy: Secondary | ICD-10-CM

## 2016-03-23 DIAGNOSIS — K219 Gastro-esophageal reflux disease without esophagitis: Secondary | ICD-10-CM

## 2016-03-23 LAB — BLOOD GAS, ARTERIAL
Acid-Base Excess: 5.6 mmol/L — ABNORMAL HIGH (ref 0.0–2.0)
Bicarbonate: 29.8 mmol/L — ABNORMAL HIGH (ref 20.0–28.0)
DRAWN BY: 42624
O2 Content: 5 L/min
O2 Saturation: 91 %
PATIENT TEMPERATURE: 98.6
pCO2 arterial: 45.5 mmHg (ref 32.0–48.0)
pH, Arterial: 7.432 (ref 7.350–7.450)
pO2, Arterial: 58.8 mmHg — ABNORMAL LOW (ref 83.0–108.0)

## 2016-03-23 LAB — GLUCOSE, CAPILLARY
GLUCOSE-CAPILLARY: 239 mg/dL — AB (ref 65–99)
GLUCOSE-CAPILLARY: 288 mg/dL — AB (ref 65–99)
GLUCOSE-CAPILLARY: 141 mg/dL — AB (ref 65–99)
GLUCOSE-CAPILLARY: 170 mg/dL — AB (ref 65–99)
Glucose-Capillary: 272 mg/dL — ABNORMAL HIGH (ref 65–99)

## 2016-03-23 LAB — MRSA PCR SCREENING: MRSA BY PCR: POSITIVE — AB

## 2016-03-23 LAB — COMPREHENSIVE METABOLIC PANEL
ALK PHOS: 124 U/L (ref 38–126)
ALT: 20 U/L (ref 14–54)
AST: 27 U/L (ref 15–41)
Albumin: 3.2 g/dL — ABNORMAL LOW (ref 3.5–5.0)
Anion gap: 10 (ref 5–15)
BILIRUBIN TOTAL: 0.7 mg/dL (ref 0.3–1.2)
BUN: 6 mg/dL (ref 6–20)
CALCIUM: 9 mg/dL (ref 8.9–10.3)
CO2: 29 mmol/L (ref 22–32)
CREATININE: 0.43 mg/dL — AB (ref 0.44–1.00)
Chloride: 96 mmol/L — ABNORMAL LOW (ref 101–111)
Glucose, Bld: 126 mg/dL — ABNORMAL HIGH (ref 65–99)
Potassium: 3.1 mmol/L — ABNORMAL LOW (ref 3.5–5.1)
SODIUM: 135 mmol/L (ref 135–145)
Total Protein: 6.2 g/dL — ABNORMAL LOW (ref 6.5–8.1)

## 2016-03-23 LAB — HEMOGLOBIN A1C
Hgb A1c MFr Bld: 7.3 % — ABNORMAL HIGH (ref 4.8–5.6)
Mean Plasma Glucose: 163 mg/dL

## 2016-03-23 LAB — CBC
HCT: 44.7 % (ref 36.0–46.0)
HEMOGLOBIN: 15.2 g/dL — AB (ref 12.0–15.0)
MCH: 28.5 pg (ref 26.0–34.0)
MCHC: 34 g/dL (ref 30.0–36.0)
MCV: 83.9 fL (ref 78.0–100.0)
Platelets: 258 10*3/uL (ref 150–400)
RBC: 5.33 MIL/uL — AB (ref 3.87–5.11)
RDW: 12.8 % (ref 11.5–15.5)
WBC: 9.8 10*3/uL (ref 4.0–10.5)

## 2016-03-23 LAB — PHOSPHORUS: Phosphorus: 3.3 mg/dL (ref 2.5–4.6)

## 2016-03-23 LAB — MAGNESIUM
MAGNESIUM: 1.7 mg/dL (ref 1.7–2.4)
MAGNESIUM: 1.7 mg/dL (ref 1.7–2.4)

## 2016-03-23 LAB — AMMONIA: Ammonia: 51 umol/L — ABNORMAL HIGH (ref 9–35)

## 2016-03-23 LAB — TSH: TSH: 1.511 u[IU]/mL (ref 0.350–4.500)

## 2016-03-23 MED ORDER — INFLUENZA VAC SPLIT QUAD 0.5 ML IM SUSY
0.5000 mL | PREFILLED_SYRINGE | INTRAMUSCULAR | Status: AC
  Administered 2016-03-24: 0.5 mL via INTRAMUSCULAR
  Filled 2016-03-23: qty 0.5

## 2016-03-23 MED ORDER — SENNOSIDES-DOCUSATE SODIUM 8.6-50 MG PO TABS: 1.0000 | ORAL_TABLET | Freq: Two times a day (BID) | ORAL | Status: DC | PRN

## 2016-03-23 MED ORDER — PREGABALIN 75 MG PO CAPS
150.0000 mg | ORAL_CAPSULE | Freq: Every day | ORAL | Status: DC
  Administered 2016-03-23: 150 mg via ORAL
  Filled 2016-03-23: qty 2

## 2016-03-23 MED ORDER — LEVETIRACETAM 500 MG PO TABS
1000.0000 mg | ORAL_TABLET | Freq: Two times a day (BID) | ORAL | Status: DC
  Administered 2016-03-23 – 2016-03-24 (×2): 1000 mg via ORAL
  Filled 2016-03-23 (×3): qty 2

## 2016-03-23 MED ORDER — CHLORHEXIDINE GLUCONATE CLOTH 2 % EX PADS
6.0000 | MEDICATED_PAD | Freq: Every day | CUTANEOUS | Status: DC
  Administered 2016-03-23 – 2016-03-24 (×2): 6 via TOPICAL

## 2016-03-23 MED ORDER — ALBUTEROL SULFATE HFA 108 (90 BASE) MCG/ACT IN AERS: 2.0000 | INHALATION_SPRAY | RESPIRATORY_TRACT | Status: DC | PRN

## 2016-03-23 MED ORDER — POTASSIUM CHLORIDE 10 MEQ/100ML IV SOLN
10.0000 meq | INTRAVENOUS | Status: AC
  Administered 2016-03-23: 10 meq via INTRAVENOUS
  Filled 2016-03-23: qty 100

## 2016-03-23 MED ORDER — ROPINIROLE HCL 0.25 MG PO TABS
0.2500 mg | ORAL_TABLET | Freq: Three times a day (TID) | ORAL | Status: DC
  Administered 2016-03-23 – 2016-03-24 (×2): 0.25 mg via ORAL
  Filled 2016-03-23 (×5): qty 1

## 2016-03-23 MED ORDER — POTASSIUM CHLORIDE 20 MEQ/15ML (10%) PO SOLN
20.0000 meq | Freq: Two times a day (BID) | ORAL | Status: DC
  Administered 2016-03-23 – 2016-03-24 (×3): 20 meq via ORAL
  Filled 2016-03-23 (×3): qty 15

## 2016-03-23 MED ORDER — MOMETASONE FURO-FORMOTEROL FUM 200-5 MCG/ACT IN AERO
2.0000 | INHALATION_SPRAY | Freq: Two times a day (BID) | RESPIRATORY_TRACT | Status: DC
  Administered 2016-03-24: 2 via RESPIRATORY_TRACT
  Filled 2016-03-23 (×2): qty 8.8

## 2016-03-23 MED ORDER — DOXYCYCLINE HYCLATE 100 MG IV SOLR
100.0000 mg | Freq: Two times a day (BID) | INTRAVENOUS | Status: DC
  Administered 2016-03-23 (×2): 100 mg via INTRAVENOUS
  Filled 2016-03-23 (×4): qty 100

## 2016-03-23 MED ORDER — IPRATROPIUM-ALBUTEROL 0.5-2.5 (3) MG/3ML IN SOLN
3.0000 mL | Freq: Four times a day (QID) | RESPIRATORY_TRACT | Status: DC
  Administered 2016-03-23 (×3): 3 mL via RESPIRATORY_TRACT
  Filled 2016-03-23 (×3): qty 3

## 2016-03-23 MED ORDER — METHYLPREDNISOLONE SODIUM SUCC 125 MG IJ SOLR
60.0000 mg | Freq: Every day | INTRAMUSCULAR | Status: DC
  Administered 2016-03-23: 60 mg via INTRAVENOUS
  Filled 2016-03-23 (×2): qty 2

## 2016-03-23 MED ORDER — PANTOPRAZOLE SODIUM 40 MG PO TBEC
40.0000 mg | DELAYED_RELEASE_TABLET | Freq: Every day | ORAL | Status: DC
  Administered 2016-03-23 – 2016-03-24 (×2): 40 mg via ORAL
  Filled 2016-03-23 (×2): qty 1

## 2016-03-23 MED ORDER — MUPIROCIN 2 % EX OINT
1.0000 "application " | TOPICAL_OINTMENT | Freq: Two times a day (BID) | CUTANEOUS | Status: DC
  Administered 2016-03-23 – 2016-03-24 (×3): 1 via NASAL
  Filled 2016-03-23: qty 22

## 2016-03-23 MED ORDER — SENNA-DOCUSATE SODIUM 8.6-50 MG PO TABS: 1.0000 | ORAL_TABLET | Freq: Two times a day (BID) | ORAL | Status: DC | PRN

## 2016-03-23 MED ORDER — IPRATROPIUM-ALBUTEROL 0.5-2.5 (3) MG/3ML IN SOLN
3.0000 mL | Freq: Four times a day (QID) | RESPIRATORY_TRACT | Status: DC
  Administered 2016-03-23 – 2016-03-24 (×3): 3 mL via RESPIRATORY_TRACT
  Filled 2016-03-23 (×3): qty 3

## 2016-03-23 MED ORDER — AMLODIPINE BESYLATE 10 MG PO TABS
10.0000 mg | ORAL_TABLET | Freq: Every day | ORAL | Status: DC
  Administered 2016-03-23 – 2016-03-24 (×2): 10 mg via ORAL
  Filled 2016-03-23 (×2): qty 1

## 2016-03-23 MED ORDER — CLOPIDOGREL BISULFATE 75 MG PO TABS
75.0000 mg | ORAL_TABLET | Freq: Every day | ORAL | Status: DC
  Administered 2016-03-23 – 2016-03-24 (×2): 75 mg via ORAL
  Filled 2016-03-23 (×2): qty 1

## 2016-03-23 MED ORDER — IPRATROPIUM-ALBUTEROL 0.5-2.5 (3) MG/3ML IN SOLN: 3.0000 mL | Freq: Four times a day (QID) | RESPIRATORY_TRACT | Status: DC | PRN

## 2016-03-23 MED ORDER — DOXYCYCLINE HYCLATE 100 MG PO TABS
100.0000 mg | ORAL_TABLET | Freq: Two times a day (BID) | ORAL | Status: DC
Start: 2016-03-23 — End: 2016-03-24
  Administered 2016-03-23 – 2016-03-24 (×2): 100 mg via ORAL
  Filled 2016-03-23 (×3): qty 1

## 2016-03-23 MED ORDER — LEVETIRACETAM 500 MG/5ML IV SOLN
1000.0000 mg | Freq: Two times a day (BID) | INTRAVENOUS | Status: DC
  Administered 2016-03-23: 1000 mg via INTRAVENOUS
  Filled 2016-03-23 (×2): qty 10

## 2016-03-23 MED ORDER — GUAIFENESIN ER 600 MG PO TB12
600.0000 mg | ORAL_TABLET | Freq: Two times a day (BID) | ORAL | Status: DC
  Administered 2016-03-23 – 2016-03-24 (×3): 600 mg via ORAL
  Filled 2016-03-23 (×3): qty 1

## 2016-03-23 MED ORDER — ENALAPRIL MALEATE 20 MG PO TABS
20.0000 mg | ORAL_TABLET | Freq: Every day | ORAL | Status: DC
  Administered 2016-03-23 – 2016-03-24 (×2): 20 mg via ORAL
  Filled 2016-03-23 (×2): qty 1

## 2016-03-23 MED ORDER — PREDNISONE 20 MG PO TABS: 50.0000 mg | ORAL_TABLET | Freq: Every day | ORAL | Status: DC

## 2016-03-23 MED ORDER — CITALOPRAM HYDROBROMIDE 20 MG PO TABS
20.0000 mg | ORAL_TABLET | Freq: Every day | ORAL | Status: DC
  Administered 2016-03-23 – 2016-03-24 (×2): 20 mg via ORAL
  Filled 2016-03-23 (×2): qty 1

## 2016-03-23 MED ORDER — ALBUTEROL SULFATE (2.5 MG/3ML) 0.083% IN NEBU
2.5000 mg | INHALATION_SOLUTION | RESPIRATORY_TRACT | Status: DC | PRN
Start: 2016-03-23 — End: 2016-03-24

## 2016-03-23 NOTE — Progress Notes (Signed)
Pt called me into room because her piv came out, Pt does not want to have another one placed. I will notify Dr Maylene Roes. Consuelo Pandy RN

## 2016-03-23 NOTE — Progress Notes (Signed)
Subjective: Currently awake, no events today.  Exam: Vitals:   03/23/16 0411 03/23/16 0752  BP: (!) 122/53 130/64  Pulse: (!) 58 74  Resp: (!) 28 (!) 24  Temp: 97.8 F (36.6 C) 98.1 F (36.7 C)   Gen: In bed, NAD Resp: non-labored breathing, no acute distress Abd: soft, nt  Neuro: MS: Awake, alert, follows commands readily. She lip reads and mouths words CN: Pupils equal round reactive, extra movements intact Motor: Moves all extremities with good strength Sensory: Endorses symmetric sensation  Impression: 65 year old female with a long history of seizures. Though there has been some question as to the characterization of the spells, I did not see myself and therefore agree with continuing the [place] which she was supposed be taking prior to admission.  Recommendations: 1) Keppra 1 g twice a day as previously prescribed 2) EEG, likely continuous monitoring if she continues to have spells, otherwise there is no need to continue hospitalization solely to obtain EEG 3) if back to baseline, could be discharged today from neurology standpoint 4) neurology will sign off, please call if further episodes occur, or if any further questions or concerns remain.  Roland Rack, MD [facility] [phone]  If 7pm- 7am, please page neurology on call as listed in [place].

## 2016-03-23 NOTE — Progress Notes (Addendum)
Patient arrived to room via carelink from [place]. Patient is deaf sign language, writing, or reading lips is her way of communicating per daughter at bedside. Daughter provided some health history and the events of the day also included her mothers wishes of code status not to be intubated but do all other measures. Patient is lethargic not following commands. Daughter went home and sign language interpreter at bedside to help assist. MD notified of arrival to floor.

## 2016-03-23 NOTE — Progress Notes (Signed)
PROGRESS NOTE    HAIDEE STOGSDILL  IWO:[id number] DOB: [birth year] DOA: 03/22/2016 PCP: Donnie Coffin, MD      Brief Narrative:  [name] is a 65 y.o. female with medical history significant of seizure disorder. She had an episode of seizure at home and EMS was called. She had another 2 witnessed seizure episodes. She does not take any seizure medications at home and hasn't had a seizure for many years. Of note recently she was treated for urinary tract infection with Macrobid and Pyridium. Patient originally presented to [facility] but secondary to recurrent seizure events while in emergency department was transferred to [facility] for neurology consult.    Assessment & Plan:   Active Problems:   COPD exacerbation ([place])   Convulsions/seizures ([place])   DM (diabetes mellitus), type 2 ([place])   Essential hypertension   Neuropathy ([facility])   GERD (gastroesophageal reflux disease)   Depression   Seizure -Neurology consulted and signed off with recommendations  -EEG ordered, continuous monitoring if she continues to have spells -Seizure precautions  -Keppra 1040m BID  -Ativan [id number] prn for seizure   Acute encephalopathy -Due to above, now baseline   AECOPD with chronic respiratory failure  -Requires 2L [place] at nighttime  -Dulera  -DuoNeb prn  -Doxycycline  -Prednisone   Recent dx UTI -UA from 9/14 reviewed in care everywhere; small leuk est with only 10-50 WBC. Was prescribed macrobid and pyridium at that time  -UA obtained in ED with hyaline casts, 0-5 WBC -Monitor for symptoms   Hypokalemia -Replaced  Essential HTN -Continued norvasc, enalapril   CAD -Continue plavix  Depression -Continue celexa   DM type II with neuropathy  -SSI  -Lyrica   GERD -PPI   RLS -Continue requip    DVT prophylaxis: SCDs Code Status: Partial, do not intubate Family Communication: daughter present at bedside Disposition Plan: Cont tx COPD exacerbation and monitor for  recurrent seizure. Needs to be discharged with keppra and follow up with her neurologist.    Consultants:   Neurology   Procedures:   EEG pending   Antimicrobials:   Doxycycline 9/16 >>    Subjective: Patient without seizure events overnight. She relates to me through a sign language interpreter in the room. She states that she has had a cough for 2 months, with some production of white and/or yellow sputum. On chart review, she had right sided pneumonia in April. Repeat chest x-ray at admission showed right-sided segmental atelectasis. She is without any fevers or leukocytosis. She is at baseline oxygen requirements. She also relates that she had some discomfort with urination as well as hematuria. She was prescribed an antibiotic on Thursday. She states that her symptom is still present. She is not sure if it is any better since taking antibiotics.   Objective: Vitals:   03/23/16 0411 03/23/16 0752 03/23/16 1100 03/23/16 1310  BP: (!) 122/53 130/64 (!) 137/59   Pulse: (!) 58 74 79 72  Resp: (!) 28 (!) 24 (!) 29 20  Temp: 97.8 F (36.6 C) 98.1 F (36.7 C) 98.3 F (36.8 C)   TempSrc: Axillary Axillary Oral   SpO2: 97% 97% 93% 92%  Weight:      Height:        Intake/Output Summary (Last 24 hours) at 03/23/16 1324 Last data filed at 03/23/16 1100  Gross per 24 hour  Intake              700 ml  Output  0 ml  Net              700 ml   Filed Weights   03/22/16 2145  Weight: 74.8 kg (165 lb)    Examination:  General exam: Appears calm and comfortable  Respiratory system: Clear to auscultation. +dry coughing fits  Cardiovascular system: S1 & S2 heard, RRR. No JVD, murmurs, rubs, gallops or clicks. No pedal edema. Gastrointestinal system: Abdomen is nondistended, soft and nontender. No organomegaly or masses felt. Normal bowel sounds heard. Central nervous system: Alert and oriented. No focal neurological deficits. Extremities: Symmetric 5 x 5 power. Skin:  No rashes, lesions or ulcers Psychiatry: Judgement and insight appear normal. Mood & affect appropriate.   Data Reviewed: I have personally reviewed following labs and imaging studies  CBC:  Recent Labs Lab 03/22/16 1559 03/23/16 0400  WBC 10.7 9.8  NEUTROABS 6.9*  --   HGB 16.4* 15.2*  HCT 46.7 44.7  MCV 80.8 83.9  PLT 303 616   Basic Metabolic Panel:  Recent Labs Lab 03/22/16 1559 03/23/16 0400  NA 132* 135  K 2.9* 3.1*  CL 95* 96*  CO2 28 29  GLUCOSE 178* 126*  BUN 12 6  CREATININE 0.80 0.43*  CALCIUM 9.0 9.0  MG 1.7 1.7  1.7  PHOS  --  3.3   GFR: Estimated Creatinine Clearance: 68.9 mL/min (by C-G formula based on SCr of 0.43 mg/dL (L)). Liver Function Tests:  Recent Labs Lab 03/22/16 1559 03/23/16 0400  AST 40 27  ALT 25 20  ALKPHOS 130* 124  BILITOT 0.9 0.7  PROT 7.0 6.2*  ALBUMIN 4.0 3.2*   No results for input(s): LIPASE, AMYLASE in the last 168 hours.  Recent Labs Lab 03/23/16 0400  AMMONIA 51*   Coagulation Profile:  Recent Labs Lab 03/22/16 1559  INR 0.98   Cardiac Enzymes:  Recent Labs Lab 03/22/16 1559  CKTOTAL 80   BNP (last 3 results) No results for input(s): PROBNP in the last 8760 hours. HbA1C: No results for input(s): HGBA1C in the last 72 hours. CBG:  Recent Labs Lab 03/22/16 1601 03/22/16 2110 03/23/16 0757 03/23/16 1139  GLUCAP 175* 135* 239* 272*   Lipid Profile: No results for input(s): CHOL, HDL, LDLCALC, TRIG, CHOLHDL, LDLDIRECT in the last 72 hours. Thyroid Function Tests:  Recent Labs  03/23/16 0400  TSH 1.511   Anemia Panel: No results for input(s): VITAMINB12, FOLATE, FERRITIN, TIBC, IRON, RETICCTPCT in the last 72 hours. Sepsis Labs: No results for input(s): PROCALCITON, LATICACIDVEN in the last 168 hours.  Recent Results (from the past 240 hour(s))  MRSA PCR Screening     Status: Abnormal   Collection Time: 03/23/16  5:19 AM  Result Value Ref Range Status   MRSA by PCR POSITIVE (A)  NEGATIVE Final    Comment:        The GeneXpert MRSA Assay (FDA approved for NASAL specimens only), is one component of a comprehensive MRSA colonization surveillance program. It is not intended to diagnose MRSA infection nor to guide or monitor treatment for MRSA infections. RESULT CALLED TO, READ BACK BY AND VERIFIED WITH: C. HAGERMAN [id number] 09.17.17 N. MORRIS          Radiology Studies: Ct Head Wo Contrast  Result Date: 03/22/2016 CLINICAL DATA:  Patient found on floor.  Seizure like activity. EXAM: CT HEAD WITHOUT CONTRAST TECHNIQUE: Contiguous axial images were obtained from the base of the skull through the vertex without intravenous contrast. COMPARISON:  Brain CT 04/23/2013  FINDINGS: Brain: Ventricles and sulci are appropriate for patient's age. No evidence for acute cortically based infarct, intracranial hemorrhage, mass lesion or mass-effect. Vascular: No hyperdense vessel or unexpected calcification. Skull: Postsurgical changes within the left mastoid. Calvarium is intact. Paranasal sinuses are unremarkable. Sinuses/Orbits: Paranasal sinuses unremarkable. Orbits unremarkable. Other: None. IMPRESSION: No acute intracranial process. Electronically Signed   By: Lovey Newcomer M.D.   On: 03/22/2016 16:44   Dg Chest Port 1 View  Result Date: 03/22/2016 CLINICAL DATA:  Seizure, smoker, history asthma, COPD, diabetes mellitus, hepatitis, hypertension, stroke EXAM: PORTABLE CHEST 1 VIEW COMPARISON:  Portable exam 1700 hours compared to 10/30/2015 FINDINGS: Borderline enlargement of cardiac silhouette. Atherosclerotic calcification aorta. Slightly prominent central pulmonary arteries unchanged. Subsegmental atelectasis RIGHT base. Lungs otherwise clear. No pleural effusion or pneumothorax. Bones demineralized. IMPRESSION: Subsegmental atelectasis at RIGHT base. Aortic atherosclerosis. Electronically Signed   By: Lavonia Dana M.D.   On: 03/22/2016 17:22        Scheduled Meds: .  amLODipine  10 mg Oral Daily  . Chlorhexidine Gluconate Cloth  6 each Topical Q0600  . citalopram  20 mg Oral Daily  . clopidogrel  75 mg Oral Daily  . doxycycline  100 mg Oral Q12H  . enalapril  20 mg Oral Daily  . guaiFENesin  600 mg Oral BID  . [START ON 03/24/2016] Influenza vac split quadrivalent PF  0.5 mL Intramuscular Tomorrow-1000  . insulin aspart  0-9 Units Subcutaneous Q4H  . ipratropium-albuterol  3 mL Nebulization QID  . levETIRAcetam  1,000 mg Oral BID  . mometasone-formoterol  2 puff Inhalation BID  . mupirocin ointment  1 application Nasal BID  . pantoprazole  40 mg Oral Daily  . potassium chloride  20 mEq Oral BID  . [START ON 03/24/2016] predniSONE  50 mg Oral Q breakfast  . pregabalin  150 mg Oral QHS  . rOPINIRole  0.25 mg Oral TID  . sodium chloride flush  3 mL Intravenous Q12H   Continuous Infusions:    LOS: 1 day    Time spent: 40 minutes    Dessa Phi, DO [facility] Pager [phone]  If 7PM-7AM, please contact night-coverage [email or web address] Password [email or web address] 03/23/2016, 1:24 PM

## 2016-03-24 ENCOUNTER — Observation Stay (HOSPITAL_COMMUNITY): Payer: BLUE CROSS/BLUE SHIELD

## 2016-03-24 DIAGNOSIS — E1142 Type 2 diabetes mellitus with diabetic polyneuropathy: Secondary | ICD-10-CM | POA: Diagnosis not present

## 2016-03-24 DIAGNOSIS — J441 Chronic obstructive pulmonary disease with (acute) exacerbation: Secondary | ICD-10-CM | POA: Diagnosis not present

## 2016-03-24 DIAGNOSIS — R569 Unspecified convulsions: Secondary | ICD-10-CM

## 2016-03-24 DIAGNOSIS — G40909 Epilepsy, unspecified, not intractable, without status epilepticus: Secondary | ICD-10-CM | POA: Diagnosis not present

## 2016-03-24 DIAGNOSIS — I10 Essential (primary) hypertension: Secondary | ICD-10-CM | POA: Diagnosis not present

## 2016-03-24 DIAGNOSIS — F329 Major depressive disorder, single episode, unspecified: Secondary | ICD-10-CM | POA: Diagnosis not present

## 2016-03-24 LAB — BASIC METABOLIC PANEL
Anion gap: 7 (ref 5–15)
BUN: 9 mg/dL (ref 6–20)
CALCIUM: 8.8 mg/dL — AB (ref 8.9–10.3)
CHLORIDE: 97 mmol/L — AB (ref 101–111)
CO2: 28 mmol/L (ref 22–32)
CREATININE: 0.6 mg/dL (ref 0.44–1.00)
GFR calc non Af Amer: >60 mL/min (ref >60)
GLUCOSE: 164 mg/dL — AB (ref 65–99)
Potassium: 3.8 mmol/L (ref 3.5–5.1)
Sodium: 132 mmol/L — ABNORMAL LOW (ref 135–145)

## 2016-03-24 LAB — URINE CULTURE: CULTURE: NO GROWTH

## 2016-03-24 LAB — GLUCOSE, CAPILLARY
GLUCOSE-CAPILLARY: 119 mg/dL — AB (ref 65–99)
Glucose-Capillary: 162 mg/dL — ABNORMAL HIGH (ref 65–99)
Glucose-Capillary: 157 mg/dL — ABNORMAL HIGH (ref 65–99)
Glucose-Capillary: 207 mg/dL — ABNORMAL HIGH (ref 65–99)

## 2016-03-24 MED ORDER — ROPINIROLE HCL 0.25 MG PO TABS: 0.2500 mg | ORAL_TABLET | Freq: Three times a day (TID) | ORAL | 2 refills | Status: DC

## 2016-03-24 MED ORDER — DOXYCYCLINE HYCLATE 100 MG PO TABS: 100.0000 mg | ORAL_TABLET | Freq: Two times a day (BID) | ORAL | 0 refills | Status: DC

## 2016-03-24 MED ORDER — POTASSIUM CHLORIDE 10 MEQ/100ML IV SOLN: 10.0000 meq | INTRAVENOUS | Status: DC

## 2016-03-24 MED ORDER — LEVETIRACETAM 1000 MG PO TABS: 1000.0000 mg | ORAL_TABLET | Freq: Two times a day (BID) | ORAL | 2 refills | Status: DC

## 2016-03-24 MED ORDER — PREDNISONE 10 MG PO TABS: ORAL_TABLET | ORAL | 0 refills | Status: DC

## 2016-03-24 MED ORDER — ALBUTEROL SULFATE (2.5 MG/3ML) 0.083% IN NEBU: 2.5000 mg | INHALATION_SOLUTION | RESPIRATORY_TRACT | Status: DC | PRN

## 2016-03-24 MED ORDER — MOMETASONE FURO-FORMOTEROL FUM 200-5 MCG/ACT IN AERO: 2.0000 | INHALATION_SPRAY | Freq: Two times a day (BID) | RESPIRATORY_TRACT | 2 refills | Status: DC

## 2016-03-24 NOTE — Clinical Social Work Note (Signed)
CSW acknowledges advanced directives consult with comment: Please contact daughter Zhavia [phone] Or [phone]. Will ask RN to consult chaplain.  CSW signing off. Consult again if any social work needs arise.  [place] Scrape, [place]

## 2016-03-24 NOTE — Care Management Note (Addendum)
Case Management Note  Patient Details  Name: [name] MRN: [id number] Date of Birth: 1951/02/22  Subjective/Objective:   NCM spoke with patient and her daughter, patient is deaf but can read lips.  She lives alone but her daughter will be staying with her a couple of days when she is discharged.  She states she has had [facility] in the past and would like to have them again, but the last time [facility] state they do not work with [facility].  Daughter states is [facility] does not work with [facility] that they would be fine wi/out hhpt because her daughter is a Community education officer and she knows what they worked with her mom with and she will do this with her at home.  NCM awaiting to hear back from [facility] , will let daughter and patient know what [facility] says.  NCM spoke with Santiago Glad with [facility], she states she did a Pharmacist, community and it states that patient has no co pay and yes they will be able to take her referral.  Daughter states she would like to have her RN back too, her name is Georgiann Cocker,  NCM informed Santiago Glad of this and she said ok.  Referral given to Santiago Glad with [facility] for [facility]HP[facility]/ [facility], soc will begin 24-48 hrs post dc.   Patient is on home oxygen, but only at night, she could not remember name of oxygen company, but daughter states she does not use it continuously and she will be ok to go home w/out oxygen.              Action/Plan:   Expected Discharge Date:                  Expected Discharge Plan:  [place]  In-House Referral:     Discharge planning Services  CM Consult  Post Acute Care Choice:    Choice offered to:     DME Arranged:    DME Agency:     HH Arranged:   HHRN, [place] Agency:    [facility]  Status of Service:  Completed, signed off  If discussed at [place] of Stay Meetings, dates discussed:    Additional Comments:  Zenon Mayo, RN 03/24/2016, 2:08 PM

## 2016-03-24 NOTE — Evaluation (Signed)
Physical Therapy Evaluation Patient Details Name: [name] MRN: [id number] DOB: [birth year] Today's Date: 03/24/2016   History of Present Illness  [name] is a 65 y.o. female with medical history significant of seizure disorder and deafness admitted with seizure.  Clinical Impression  Patient presents with decreased independence with mobility and at high risk for falls per Merrilee Jansky balance assessment (score 39/56; scores less than 45 demonstrate high fall risk.)  She will benefit from skilled PT in the acute setting to allow return home with family support and follow up HHPT.     Follow Up Recommendations Home health PT    Equipment Recommendations  None recommended by PT    Recommendations for Other Services       Precautions / Restrictions Precautions Precautions: Fall Precaution Comments: has had 2 falls in 6 months      Mobility  Bed Mobility Overal bed mobility: Modified Independent                Transfers Overall transfer level: Needs assistance Equipment used: None Transfers: Sit to/from Stand Sit to Stand: Min assist         General transfer comment: posterior loss of balance when initially standing up assist for safety  Ambulation/Gait Ambulation/Gait assistance: Supervision Ambulation Distance (Feet): 400 Feet Assistive device:  (pushing wheelchair) Gait Pattern/deviations: Step-through pattern;Decreased stride length     General Gait Details: stable while holding wheelchair  Stairs            Wheelchair Mobility    Modified Rankin (Stroke Patients Only)       Balance Overall balance assessment: Needs assistance   Sitting balance-Leahy Scale: Good       Standing balance-Leahy Scale: Fair Standing balance comment: Though initially with posterior LOB able to stand 2 minutes for BERG no UE support                 Standardized Balance Assessment Standardized Balance Assessment : Berg Balance Test Berg Balance  Test Sit to Stand: Able to stand  independently using hands Standing Unsupported: Able to stand safely 2 minutes Sitting with Back Unsupported but Feet Supported on Floor or Stool: Able to sit safely and securely 2 minutes Stand to Sit: Sits safely with minimal use of hands Transfers: Able to transfer safely, minor use of hands Standing Unsupported with Eyes Closed: Able to stand 10 seconds with supervision Standing Ubsupported with Feet Together: Able to place feet together independently and stand for 1 minute with supervision From Standing, Reach Forward with Outstretched Arm: Can reach forward >12 cm safely (5") From Standing Position, Pick up Object from Floor: Able to pick up shoe safely and easily From Standing Position, Turn to Look Behind Over each Shoulder: Needs assist to keep from losing balance and falling Turn 360 Degrees: Needs close supervision or verbal cueing Standing Unsupported, Alternately Place Feet on Step/Stool: Able to complete 4 steps without aid or supervision Standing Unsupported, One Foot in Front: Able to take small step independently and hold 30 seconds Standing on One Leg: Able to lift leg independently and hold equal to or more than 3 seconds Total Score: 39         Pertinent Vitals/Pain Pain Assessment: No/denies pain    Home Living Family/patient expects to be discharged to:: Private residence Living Arrangements: Alone Available Help at Discharge: Family;Available 24 hours/day (daughter to stay for a few days) Type of Home: House Home Access: Stairs to enter   [employer] of Steps:  3 Home Layout: One level Home Equipment: Walker - 2 wheels;Cane - quad      Prior Function Level of Independence: Independent               Hand Dominance        Extremity/Trunk Assessment   Upper Extremity Assessment: Overall WFL for tasks assessed           Lower Extremity Assessment: Generalized weakness         Communication    Communication: Deaf  Cognition Arousal/Alertness: Awake/alert Behavior During Therapy: WFL for tasks assessed/performed Overall Cognitive Status: Within Functional Limits for tasks assessed                      General Comments General comments (skin integrity, edema, etc.): Educated on fall prevention with specific note on footwear, lighting, keeping pathway clear, use of cane indoors and walker outside, and keeping daily use items within reach (shoulder to hip height)    Exercises     Assessment/Plan    PT Assessment Patient needs continued PT services  PT Problem List Decreased strength;Decreased mobility;Decreased balance;Decreased activity tolerance;Decreased knowledge of use of DME;Decreased knowledge of precautions          PT Treatment Interventions DME instruction;Gait training;Therapeutic exercise;Therapeutic activities;Patient/family education;Stair training;Functional mobility training;Balance training    PT Goals (Current goals can be found in the Care Plan section)  Acute Rehab PT Goals Patient Stated Goal: To go home PT Goal Formulation: With patient/family Time For Goal Achievement: 03/26/16 Potential to Achieve Goals: Good    Frequency Min 3X/week   Barriers to discharge        Co-evaluation               End of Session Equipment Utilized During Treatment: Gait belt;Oxygen Activity Tolerance: Patient tolerated treatment well Patient left: in bed;with call bell/phone within reach;with family/visitor present      Functional Assessment Tool Used: Clinical Judgement Functional Limitation: Mobility: Walking and moving around Mobility: Walking and Moving Around Current Status (T9694): At least 20 percent but less than 40 percent impaired, limited or restricted Mobility: Walking and Moving Around Goal Status [phone]): At least 1 percent but less than 20 percent impaired, limited or restricted    Time: 1225-1250 PT Time Calculation (min) (ACUTE  ONLY): 25 min   Charges:   PT Evaluation $PT Eval Moderate Complexity: 1 Procedure PT Treatments $Gait Training: 8-22 mins   PT G Codes:   PT G-Codes **NOT FOR INPATIENT CLASS** Functional Assessment Tool Used: Clinical Judgement Functional Limitation: Mobility: Walking and moving around Mobility: Walking and Moving Around Current Status (C7519): At least 20 percent but less than 40 percent impaired, limited or restricted Mobility: Walking and Moving Around Goal Status [phone]): At least 1 percent but less than 20 percent impaired, limited or restricted    Reginia Naas 03/24/2016, 1:38 PM  Magda Kiel, [place] 03/24/2016

## 2016-03-24 NOTE — Procedures (Signed)
ELECTROENCEPHALOGRAM REPORT  Date of Study: 03/24/2016  Patient's Name: [name] MRN: [id number] Date of Birth: 1950/09/28  Referring Provider: Dr. Melba Coon  Clinical History: This is a 65 year old woman with spells with different documented semiologies.  Medications: levETIRAcetam (KEPPRA) tablet 1,000 mg  pregabalin (LYRICA) capsule 150 mg  acetaminophen (TYLENOL) tablet 650 mg  albuterol (PROVENTIL) (2.5 MG/3ML) 0.083% nebulizer solution 2.5 mg  amLODipine (NORVASC) tablet 10 mg  Chlorhexidine Gluconate Cloth 2 % PADS 6 each  citalopram (CELEXA) tablet 20 mg  clopidogrel (PLAVIX) tablet 75 mg  doxycycline (VIBRA-TABS) tablet 100 mg  enalapril (VASOTEC) tablet 20 mg  guaiFENesin (MUCINEX) 12 hr tablet 600 mg  HYDROcodone-acetaminophen (NORCO/VICODIN) 5-325 MG per tablet 1-2 tablet  Influenza vac split quadrivalent PF (FLUARIX) injection 0.5 mL  insulin aspart (novoLOG) injection 0-9 Units  ipratropium-albuterol (DUONEB) 0.5-2.5 (3) MG/3ML nebulizer solution 3 mL  LORazepam (ATIVAN) injection 2 mg  mometasone-formoterol (DULERA) 200-5 MCG/ACT inhaler 2 puff  mupirocin ointment (BACTROBAN) 2 % 1 application  predniSONE (DELTASONE) tablet 50 mg  rOPINIRole (REQUIP) tablet 0.25 mg   Technical Summary: A multichannel digital EEG recording measured by the international 10-20 system with electrodes applied with paste and impedances below 5000 ohms performed in our laboratory with EKG monitoring in an awake and asleep patient.  Hyperventilation was not performed. Photic stimulation was performed.  The digital EEG was referentially recorded, reformatted, and digitally filtered in a variety of bipolar and referential montages for optimal display.    Description: The patient is awake and asleep during the recording.  During maximal wakefulness, there is a symmetric, medium voltage 8 Hz posterior dominant rhythm that attenuates with eye opening.  The record is symmetric.   During drowsiness and sleep, there is an increase in theta and delta slowing of the background, with occasional 1-second bursts of diffuse 3 Hz delta activity seen in drowsiness. There were occasional vertex waves seen. Photic stimulation did not elicit any abnormalities.  There were no epileptiform discharges or electrographic seizures seen.  EKG lead showed sinus bradycardia at 54 bpm.  Impression: This awake and asleep EEG is within normal limits.  Clinical Correlation: A normal EEG does not exclude a clinical diagnosis of epilepsy.  Clinical correlation is advised.   Ellouise Newer, M.D.

## 2016-03-24 NOTE — Discharge Summary (Signed)
Physician Discharge Summary  [name] TDD:[id number] DOB: 1950/10/11 DOA: 03/22/2016  PCP: Donnie Coffin, MD  Admit date: 03/22/2016 Discharge date: 03/24/2016  Time spent: *35 minutes  Recommendations for Outpatient Follow-up:  1. Follow PCP in 2 weeks 2. Continue Keppra 1 g twice a day 3. Continue doxycycline 100 mg twice a day for 5 more days 4. Will  discharged on prednisone taper for 5 more days   Discharge Diagnoses:  Active Problems:   COPD exacerbation ([place])   Convulsions/seizures ([place])   DM (diabetes mellitus), type 2 ([place])   Essential hypertension   Neuropathy (H[facility])   GERD (gastroesophageal reflux disease)   Depression   Discharge Condition: Stable  Diet recommendation: Low-salt diet  Filed Weights   03/22/16 2145  Weight: 74.8 kg (165 lb)    History of present illness:  65 y.o.femalewith medical history significant of seizure disorder. She had an episode of seizure at home and EMS was called. She had another 2 witnessed seizure episodes. She does not take any seizure medications at home and hasn't had a seizure for many years. Of note recently she was treated for urinary tract infection with Macrobid and Pyridium. Patient originally presented to [place] but secondary to recurrent seizure events while in emergency department was transferred to [facility] for neurology consult.   Hospital Course:  Seizure -Neurology consulted and signed off with recommendations  -EEG negative for seizures -Seizure precautions  -Keppra 1047m BID    Acute encephalopathy -Due to above, now baseline   AECOPD with chronic respiratory failure  -Requires 2L [place] at nighttime  -Continue Dulera Prednisone taper, doxycycline -  Recent dx UTI -UA from 9/14 reviewed in care everywhere; small leuk est with only 10-50 WBC. Was prescribed macrobid and pyridium at that time  -UA obtained in ED with hyaline casts, 0-5 WBC -Monitor for symptoms    Essential  HTN -Continued norvasc, enalapril   CAD -Continue plavix  Depression -Continue celexa   DM type II with neuropathy  -Lyrica   GERD -PPI   RLS -Continue requip   Procedures:  EEG  Consultations:  Neurology  Discharge Exam: Vitals:   03/24/16 1327 03/24/16 1520  BP: (!) 110/49 (!) 101/56  Pulse: 60 74  Resp: (!) 24 17  Temp: 97.8 F (36.6 C) 98.4 F (36.9 C)    General: Appears in no acute distress Cardiovascular: RRR, no murmurs rubs or gallops Respiratory: Clear to auscultation bilaterally, normal respiratory effort.  Discharge Instructions   Discharge Instructions    Diet - low sodium heart healthy    Complete by:  As directed    Increase activity slowly    Complete by:  As directed      Current Discharge Medication List    START taking these medications   Details  doxycycline (VIBRA-TABS) 100 MG tablet Take 1 tablet (100 mg total) by mouth every 12 (twelve) hours. Qty: 10 tablet, Refills: 0    levETIRAcetam (KEPPRA) 1000 MG tablet Take 1 tablet (1,000 mg total) by mouth 2 (two) times daily. Qty: 60 tablet, Refills: 2    predniSONE (DELTASONE) 10 MG tablet Prednisone 40 mg po daily x 1 day then Prednisone 30 mg po daily x 1 day then Prednisone 20 mg po daily x 1 day then Prednisone 10 mg daily x 1 day then stop... Qty: 10 tablet, Refills: 0      CONTINUE these medications which have CHANGED   Details  rOPINIRole (REQUIP) 0.25 MG tablet Take 1 tablet (  0.25 mg total) by mouth 3 (three) times daily. Qty: 90 tablet, Refills: 2      CONTINUE these medications which have NOT CHANGED   Details  albuterol (PROVENTIL HFA;VENTOLIN HFA) 108 (90 BASE) MCG/ACT inhaler Inhale 2 puffs into the lungs every 4 (four) hours as needed for wheezing or shortness of breath.    albuterol (PROVENTIL) (2.5 MG/3ML) 0.083% nebulizer solution Take 3 mLs (2.5 mg total) by nebulization every 4 (four) hours as needed for wheezing or shortness of breath. Qty: 90  mL, Refills: 2    amLODipine (NORVASC) 10 MG tablet Take 1 tablet by mouth daily. Refills: 10    carboxymethylcellulose (REFRESH TEARS) 0.5 % SOLN Place 1 drop into both eyes daily as needed.    citalopram (CELEXA) 20 MG tablet Take 20 mg by mouth daily.    clopidogrel (PLAVIX) 75 MG tablet Take 75 mg by mouth daily.    diphenhydrAMINE (SOMINEX) 25 MG tablet Take 25 mg by mouth at bedtime as needed for sleep.    enalapril (VASOTEC) 20 MG tablet Take 20 mg by mouth daily.    EPINEPHrine 0.3 mg/0.3 mL IJ SOAJ injection Inject into the muscle once.    gabapentin (NEURONTIN) 300 MG capsule Take 1 capsule by mouth 3 (three) times daily. Refills: 1    GLIPIZIDE XL 5 MG 24 hr tablet Take 1 tablet by mouth daily. Refills: 1    ipratropium-albuterol (DUONEB) 0.5-2.5 (3) MG/3ML SOLN Take 3 mLs by nebulization every 6 (six) hours as needed.    montelukast (SINGULAIR) 10 MG tablet Take 1 tablet by mouth at bedtime. Refills: 4    naproxen (NAPROSYN) 500 MG tablet Take 500 mg by mouth 2 (two) times daily with a meal.    nitrofurantoin, macrocrystal-monohydrate, (MACROBID) 100 MG capsule Take 1 capsule by mouth 2 (two) times daily. Refills: 0    omeprazole (PRILOSEC) 20 MG capsule Take 20 mg by mouth daily.      STOP taking these medications     hydrochlorothiazide (HYDRODIURIL) 25 MG tablet      Fluticasone-Salmeterol (ADVAIR) 500-50 MCG/DOSE AEPB      potassium chloride (KLOR-CON) 20 MEQ packet      pregabalin (LYRICA) 150 MG capsule        Allergies  Allergen Reactions  . Aspirin   . Celebrex [Celecoxib]   . Ciprofloxacin   . Codeine   . Fosphenytoin   . Levaquin [Levofloxacin In D5w]   . Levofloxacin Other (See Comments)  . Penicillins   . Sulfa Antibiotics    Follow-up Information    [facility] .   Why:  HHRN, HHPT Contact information: [address] [place] [address] [phone]            The results of significant  diagnostics from this hospitalization (including imaging, microbiology, ancillary and laboratory) are listed below for reference.    Significant Diagnostic Studies: Ct Head Wo Contrast  Result Date: 03/22/2016 CLINICAL DATA:  Patient found on floor.  Seizure like activity. EXAM: CT HEAD WITHOUT CONTRAST TECHNIQUE: Contiguous axial images were obtained from the base of the skull through the vertex without intravenous contrast. COMPARISON:  Brain CT 04/23/2013 FINDINGS: Brain: Ventricles and sulci are appropriate for patient's age. No evidence for acute cortically based infarct, intracranial hemorrhage, mass lesion or mass-effect. Vascular: No hyperdense vessel or unexpected calcification. Skull: Postsurgical changes within the left mastoid. Calvarium is intact. Paranasal sinuses are unremarkable. Sinuses/Orbits: Paranasal sinuses unremarkable. Orbits unremarkable. Other: None. IMPRESSION: No  acute intracranial process. Electronically Signed   By: Lovey Newcomer M.D.   On: 03/22/2016 16:44   Dg Chest Port 1 View  Result Date: 03/22/2016 CLINICAL DATA:  Seizure, smoker, history asthma, COPD, diabetes mellitus, hepatitis, hypertension, stroke EXAM: PORTABLE CHEST 1 VIEW COMPARISON:  Portable exam 1700 hours compared to 10/30/2015 FINDINGS: Borderline enlargement of cardiac silhouette. Atherosclerotic calcification aorta. Slightly prominent central pulmonary arteries unchanged. Subsegmental atelectasis RIGHT base. Lungs otherwise clear. No pleural effusion or pneumothorax. Bones demineralized. IMPRESSION: Subsegmental atelectasis at RIGHT base. Aortic atherosclerosis. Electronically Signed   By: Lavonia Dana M.D.   On: 03/22/2016 17:22    Microbiology: Recent Results (from the past 240 hour(s))  Urine culture     Status: None   Collection Time: 03/22/16  5:12 PM  Result Value Ref Range Status   Specimen Description URINE, RANDOM  Final   Special Requests NONE  Final   Culture NO GROWTH Performed at [facility]   Final   Report Status 03/24/2016 FINAL  Final  MRSA PCR Screening     Status: Abnormal   Collection Time: 03/23/16  5:19 AM  Result Value Ref Range Status   MRSA by PCR POSITIVE (A) NEGATIVE Final    Comment:        The GeneXpert MRSA Assay (FDA approved for NASAL specimens only), is one component of a comprehensive MRSA colonization surveillance program. It is not intended to diagnose MRSA infection nor to guide or monitor treatment for MRSA infections. RESULT CALLED TO, READ BACK BY AND VERIFIED WITH: C. HAGERMAN [id number] 09.17.17 N. MORRIS      Labs: Basic Metabolic Panel:  Recent Labs Lab 03/22/16 1559 03/23/16 0400 03/24/16 1257  NA 132* 135 132*  K 2.9* 3.1* 3.8  CL 95* 96* 97*  CO2 _0 GLUCOSE 178* 126* 164*  BUN _1 CREATININE 0.80 0.43* 0.60  CALCIUM 9.0 9.0 8.8*  MG 1.7 1.7  1.7  --   PHOS  --  3.3  --    Liver Function Tests:  Recent Labs Lab 03/22/16 1559 03/23/16 0400  AST 40 27  ALT 25 20  ALKPHOS 130* 124  BILITOT 0.9 0.7  PROT 7.0 6.2*  ALBUMIN 4.0 3.2*   No results for input(s): LIPASE, AMYLASE in the last 168 hours.  Recent Labs Lab 03/23/16 0400  AMMONIA 51*   CBC:  Recent Labs Lab 03/22/16 1559 03/23/16 0400  WBC 10.7 9.8  NEUTROABS 6.9*  --   HGB 16.4* 15.2*  HCT 46.7 44.7  MCV 80.8 83.9  PLT 303 258   Cardiac Enzymes:  Recent Labs Lab 03/22/16 1559  CKTOTAL 80   BNP: BNP (last 3 results)  Recent Labs  07/13/15 1021  BNP 44.0    ProBNP (last 3 results) No results for input(s): PROBNP in the last 8760 hours.  CBG:  Recent Labs Lab 03/23/16 2313 03/24/16 0328 03/24/16 0741 03/24/16 1325 03/24/16 1516  GLUCAP 170* 162* 119* 157* 207*       Signed:  Oswald Hillock MD.  [facility] 03/24/2016, 3:36 PM

## 2016-03-24 NOTE — Progress Notes (Signed)
Reviewed discharge instructions with pt and her daughter including medications, new prescriptions and follow up appts. Daughter states she will be staying with pt for a few days to help her. Home health nursing and PT ordered. Pt discharged via wheelchair on room air. Consuelo Pandy RN

## 2016-03-24 NOTE — Progress Notes (Signed)
Bedside EEG completed, results pending. 

## 2016-03-24 NOTE — Progress Notes (Signed)
Pt has taken off O2, O2 sats are 88%-91%. Pt states she checks her O2 sats at home and that is her baseline at home. She states she wears 2liters of O2 at night as needed. Will continue to monitor. Consuelo Pandy RN

## 2016-04-04 ENCOUNTER — Emergency Department: Payer: BLUE CROSS/BLUE SHIELD

## 2016-04-04 ENCOUNTER — Emergency Department
Admission: EM | Admit: 2016-04-04 | Discharge: 2016-04-04 | Disposition: A | Discharge status: Home / Self Care | Payer: BLUE CROSS/BLUE SHIELD | Attending: Emergency Medicine | Admitting: Emergency Medicine

## 2016-04-04 DIAGNOSIS — J45909 Unspecified asthma, uncomplicated: Secondary | ICD-10-CM | POA: Diagnosis not present

## 2016-04-04 DIAGNOSIS — G40909 Epilepsy, unspecified, not intractable, without status epilepticus: Secondary | ICD-10-CM | POA: Diagnosis present

## 2016-04-04 DIAGNOSIS — I10 Essential (primary) hypertension: Secondary | ICD-10-CM | POA: Diagnosis not present

## 2016-04-04 DIAGNOSIS — Z8673 Personal history of transient ischemic attack (TIA), and cerebral infarction without residual deficits: Secondary | ICD-10-CM | POA: Diagnosis not present

## 2016-04-04 DIAGNOSIS — F17200 Nicotine dependence, unspecified, uncomplicated: Secondary | ICD-10-CM | POA: Insufficient documentation

## 2016-04-04 DIAGNOSIS — J449 Chronic obstructive pulmonary disease, unspecified: Secondary | ICD-10-CM | POA: Insufficient documentation

## 2016-04-04 DIAGNOSIS — R569 Unspecified convulsions: Secondary | ICD-10-CM

## 2016-04-04 DIAGNOSIS — E119 Type 2 diabetes mellitus without complications: Secondary | ICD-10-CM | POA: Diagnosis not present

## 2016-04-04 DIAGNOSIS — Z79899 Other long term (current) drug therapy: Secondary | ICD-10-CM | POA: Insufficient documentation

## 2016-04-04 LAB — URINALYSIS COMPLETE WITH MICROSCOPIC (ARMC ONLY)
Bacteria, UA: NONE SEEN
Bilirubin Urine: NEGATIVE
Glucose, UA: NEGATIVE mg/dL
HGB URINE DIPSTICK: NEGATIVE
KETONES UR: NEGATIVE mg/dL
Leukocytes, UA: NEGATIVE
NITRITE: NEGATIVE
PH: 7 (ref 5.0–8.0)
PROTEIN: NEGATIVE mg/dL
SPECIFIC GRAVITY, URINE: 1.006 (ref 1.005–1.030)

## 2016-04-04 LAB — COMPREHENSIVE METABOLIC PANEL
ALBUMIN: 3.2 g/dL — AB (ref 3.5–5.0)
ALT: 17 U/L (ref 14–54)
AST: 34 U/L (ref 15–41)
Alkaline Phosphatase: 113 U/L (ref 38–126)
Anion gap: 6 (ref 5–15)
BUN: 6 mg/dL (ref 6–20)
CHLORIDE: 108 mmol/L (ref 101–111)
CO2: 24 mmol/L (ref 22–32)
Calcium: 8.3 mg/dL — ABNORMAL LOW (ref 8.9–10.3)
Creatinine, Ser: 0.42 mg/dL — ABNORMAL LOW (ref 0.44–1.00)
GFR calc Af Amer: >60 mL/min (ref >60)
Glucose, Bld: 137 mg/dL — ABNORMAL HIGH (ref 65–99)
Potassium: 3 mmol/L — ABNORMAL LOW (ref 3.5–5.1)
Sodium: 138 mmol/L (ref 135–145)
Total Bilirubin: <0.1 mg/dL — ABNORMAL LOW (ref 0.3–1.2)
Total Protein: 6.1 g/dL — ABNORMAL LOW (ref 6.5–8.1)

## 2016-04-04 LAB — GLUCOSE, CAPILLARY: Glucose-Capillary: 144 mg/dL — ABNORMAL HIGH (ref 65–99)

## 2016-04-04 LAB — CBC WITH DIFFERENTIAL/PLATELET
BASOS ABS: 0.1 10*3/uL (ref 0–0.1)
BASOS PCT: 1 %
EOS PCT: 2 %
Eosinophils Absolute: 0.1 10*3/uL (ref 0–0.7)
HCT: 43.8 % (ref 35.0–47.0)
Hemoglobin: 14.7 g/dL (ref 12.0–16.0)
Lymphocytes Relative: 35 %
Lymphs Abs: 2.1 10*3/uL (ref 1.0–3.6)
MCH: 28 pg (ref 26.0–34.0)
MCHC: 33.7 g/dL (ref 32.0–36.0)
MCV: 83.2 fL (ref 80.0–100.0)
MONO ABS: 0.4 10*3/uL (ref 0.2–0.9)
Monocytes Relative: 7 %
Neutro Abs: 3.2 10*3/uL (ref 1.4–6.5)
Neutrophils Relative %: 55 %
PLATELETS: 208 10*3/uL (ref 150–440)
RBC: 5.26 MIL/uL — ABNORMAL HIGH (ref 3.80–5.20)
RDW: 13.9 % (ref 11.5–14.5)
WBC: 5.9 10*3/uL (ref 3.6–11.0)

## 2016-04-04 LAB — TROPONIN I

## 2016-04-04 MED ORDER — LEVETIRACETAM 250 MG PO TABS: 250.0000 mg | ORAL_TABLET | Freq: Two times a day (BID) | ORAL | 0 refills | Status: DC

## 2016-04-04 MED ORDER — SODIUM CHLORIDE 0.9 % IV SOLN
1000.0000 mg | Freq: Once | INTRAVENOUS | Status: AC
  Administered 2016-04-04: 1000 mg via INTRAVENOUS
  Filled 2016-04-04: qty 10

## 2016-04-04 NOTE — ED Notes (Signed)
Seizure pads placed. Family at bedside. Pt needs ASL interpreter, night shift RN notified.

## 2016-04-04 NOTE — ED Notes (Signed)
Pt upset and wishing to leave, encouraged her to stay until discharge paperwork ready.

## 2016-04-04 NOTE — ED Triage Notes (Signed)
Pt came to ED via EMS. EMS witnessed pt having 4 seizures. Pt has history of seizures, was seen at hospital recently and had medication switched to keppra. Pt is deaf. Spontaneously will open eyes, not responding to any questions.

## 2016-04-04 NOTE — ED Notes (Signed)
Pt placed on nonrebreather.

## 2016-04-04 NOTE — ED Provider Notes (Addendum)
[facility] Emergency Department Provider Note   ____________________________________________   First MD Initiated Contact with Patient 04/04/16 1837     (approximate)  I have reviewed the triage vital signs and the nursing notes.   HISTORY  Chief Complaint Seizures   HPI [name] is a 65 y.o. female with a seizure disorder on Keppra twice a day was presenting with multiple seizures today. Per EMS, she had 4 seizures for the fire department and then to seizures for them. She received a total of 4 mg of Versed and needed to be placed on a nasal cannula oxygen secondary to be hypoxic to the 80s. She has been mostly unresponsive for EMS. No vomiting. They reported the seizures lasting from 45 seconds to 1 minute. EMS did report that one of the seizures had flailing arm movements and then another with arm stiffness. Patient did not lose bowel or bladder continence.   Past Medical History:  Diagnosis Date  . Asthma   . Cirrhosis, non-alcoholic ([place])   . COPD (chronic obstructive pulmonary disease) ([place])   . Deaf   . Depression   . Diabetes mellitus without complication ([place])   . GERD (gastroesophageal reflux disease)   . Heart murmur   . Hepatitis   . Hypertension   . Lymph node disorder    arm  . Neuropathy ([place])   . On home oxygen therapy    hs  . Orthopnea   . RLS (restless legs syndrome)   . Seizures ([place])   . Shortness of breath dyspnea   . Sleep apnea   . Stroke [facility])    tia    Patient Active Problem List   Diagnosis Date Noted  . GERD (gastroesophageal reflux disease) 03/23/2016  . Depression 03/23/2016  . Convulsions/seizures ([place]) 03/22/2016  . DM (diabetes mellitus), type 2 ([place]) 03/22/2016  . Essential hypertension 03/22/2016  . Neuropathy ([place]) 03/22/2016  . Uncontrolled seizures ([place]) 03/22/2016  . COPD exacerbation ([place]) 03/31/2015  . Acute respiratory failure with hypoxia ([place]) 03/29/2015    Past Surgical History:   Procedure Laterality Date  . CATARACT EXTRACTION W/PHACO Right 11/23/2014   Procedure: CATARACT EXTRACTION PHACO AND INTRAOCULAR LENS PLACEMENT (IOC);  Surgeon: Lyla Glassing, MD;  Location: [facility] ORS;  Service: Ophthalmology;  Laterality: Right;  . CATARACT EXTRACTION W/PHACO Left 12/14/2014   Procedure: CATARACT EXTRACTION PHACO AND INTRAOCULAR LENS PLACEMENT (IOC);  Surgeon: Lyla Glassing, MD;  Location: [facility] ORS;  Service: Ophthalmology;  Laterality: Left;  US:01:16.6 AP:15.8 CDE:12.14  . CESAREAN SECTION    . CHOLECYSTECTOMY    . KNEE ARTHROPLASTY    . THUMB ARTHROSCOPY    . TONSILLECTOMY    . TYMPANOPLASTY     muliple    Prior to Admission medications   Medication Sig Start Date End Date Taking? Authorizing Provider  albuterol (PROVENTIL HFA;VENTOLIN HFA) 108 (90 BASE) MCG/ACT inhaler Inhale 2 puffs into the lungs every 4 (four) hours as needed for wheezing or shortness of breath.    Historical Provider, MD  albuterol (PROVENTIL) (2.5 MG/3ML) 0.083% nebulizer solution Take 3 mLs (2.5 mg total) by nebulization every 4 (four) hours as needed for wheezing or shortness of breath. 03/31/15   Srikar Sudini, MD  amLODipine (NORVASC) 10 MG tablet Take 1 tablet by mouth daily. 02/18/16   Historical Provider, MD  carboxymethylcellulose (REFRESH TEARS) 0.5 % SOLN Place 1 drop into both eyes daily as needed.    Historical Provider, MD  citalopram (CELEXA) 20 MG tablet  Take 20 mg by mouth daily.    Historical Provider, MD  clopidogrel (PLAVIX) 75 MG tablet Take 75 mg by mouth daily.    Historical Provider, MD  diphenhydrAMINE (SOMINEX) 25 MG tablet Take 25 mg by mouth at bedtime as needed for sleep.    Historical Provider, MD  doxycycline (VIBRA-TABS) 100 MG tablet Take 1 tablet (100 mg total) by mouth every 12 (twelve) hours. 03/24/16   Oswald Hillock, MD  enalapril (VASOTEC) 20 MG tablet Take 20 mg by mouth daily.    Historical Provider, MD  EPINEPHrine 0.3 mg/0.3 mL IJ SOAJ injection Inject into  the muscle once.    Historical Provider, MD  gabapentin (NEURONTIN) 300 MG capsule Take 1 capsule by mouth 3 (three) times daily. 01/16/16   Historical Provider, MD  GLIPIZIDE XL 5 MG 24 hr tablet Take 1 tablet by mouth daily. 02/26/16   Historical Provider, MD  ipratropium-albuterol (DUONEB) 0.5-2.5 (3) MG/3ML SOLN Take 3 mLs by nebulization every 6 (six) hours as needed.    Historical Provider, MD  levETIRAcetam (KEPPRA) 1000 MG tablet Take 1 tablet (1,000 mg total) by mouth 2 (two) times daily. 03/24/16   Oswald Hillock, MD  mometasone-formoterol (DULERA) 200-5 MCG/ACT AERO Inhale 2 puffs into the lungs 2 (two) times daily. 03/24/16   Oswald Hillock, MD  montelukast (SINGULAIR) 10 MG tablet Take 1 tablet by mouth at bedtime. 03/18/16   Historical Provider, MD  naproxen (NAPROSYN) 500 MG tablet Take 500 mg by mouth 2 (two) times daily with a meal.    Historical Provider, MD  nitrofurantoin, macrocrystal-monohydrate, (MACROBID) 100 MG capsule Take 1 capsule by mouth 2 (two) times daily. 03/20/16   Historical Provider, MD  omeprazole (PRILOSEC) 20 MG capsule Take 20 mg by mouth daily.    Historical Provider, MD  predniSONE (DELTASONE) 10 MG tablet Prednisone 40 mg po daily x 1 day then Prednisone 30 mg po daily x 1 day then Prednisone 20 mg po daily x 1 day then Prednisone 10 mg daily x 1 day then stop... 03/24/16   Oswald Hillock, MD  rOPINIRole (REQUIP) 0.25 MG tablet Take 1 tablet (0.25 mg total) by mouth 3 (three) times daily. 03/24/16   Oswald Hillock, MD    Allergies Aspirin; Celebrex [celecoxib]; Ciprofloxacin; Codeine; Fosphenytoin; Levaquin [levofloxacin in d5w]; Levofloxacin; Penicillins; and Sulfa antibiotics  Family History  Problem Relation Age of Onset  . Lung cancer Mother   . CAD Father     Social History Social History  Substance Use Topics  . Smoking status: Current Every Day Smoker    Packs/day: 0.50  . Smokeless tobacco: Never Used  . Alcohol use No    Review of  Systems ----------------------------------------- 8:58 PM on 04/04/2016 -----------------------------------------  Sign language interpreter now the bedside. Review of systems taken at this time. Constitutional: No fever/chills Eyes: No visual changes. ENT: No sore throat. Cardiovascular: Denies chest pain. Respiratory: Denies shortness of breath. Gastrointestinal: No abdominal pain.  No nausea, no vomiting.  No diarrhea.  No constipation. Genitourinary: Negative for dysuria. Musculoskeletal: Negative for back pain. Skin: Negative for rash. Neurological: Negative for headaches, focal weakness or numbness.  10-point ROS otherwise negative.  ____________________________________________   PHYSICAL EXAM:  VITAL SIGNS: ED Triage Vitals [04/04/16 1841]  Enc Vitals Group     BP (!) 142/71     Pulse Rate 64     Resp 13     Temp 97.9 F (36.6 C)     Temp  Source Axillary     SpO2 (!) 89 %     Weight      Height      Head Circumference      Peak Flow      Pain Score      Pain Loc      Pain Edu?      Excl. in [place]?     Constitutional:On initial exam the patient is only arousable by sternal rub. Unable to communicate with her because of deafness. Eyes: Conjunctivae are normal. PERRL. EOMI. Head: Atraumatic. Nose: No congestion/rhinnorhea. Wearing nasal cannula oxygen. Mouth/Throat: Mucous membranes are moist.   Neck: No stridor.   Cardiovascular: Normal rate, regular rhythm. Grossly normal heart sounds.  Good peripheral circulation. Respiratory: Normal respiratory effort.  No retractions. Lungs CTAB. Gastrointestinal: Soft and nontender. No distention. No abdominal bruits. No CVA tenderness. Musculoskeletal: No lower extremity tenderness nor edema.  No joint effusions. Neurologic:  Aroused by sternal rub only and opens her eyes to sternal rub only. Appears postictal. Skin:  Skin is warm, dry and intact. No rash noted. Psychiatric: Mood and affect are normal. Speech and  behavior are normal.  ____________________________________________   LABS (all labs ordered are listed, but only abnormal results are displayed)  Labs Reviewed  URINALYSIS COMPLETEWITH MICROSCOPIC ([facility] ONLY) - Abnormal; Notable for the following:       Result Value   Color, Urine STRAW (*)    APPearance CLEAR (*)    Squamous Epithelial / LPF 0-5 (*)    All other components within normal limits  CBC WITH DIFFERENTIAL/PLATELET - Abnormal; Notable for the following:    RBC 5.26 (*)    All other components within normal limits  COMPREHENSIVE METABOLIC PANEL - Abnormal; Notable for the following:    Potassium 3.0 (*)    Glucose, Bld 137 (*)    Creatinine, Ser 0.42 (*)    Calcium 8.3 (*)    Total Protein 6.1 (*)    Albumin 3.2 (*)    Total Bilirubin <0.1 (*)    All other components within normal limits  GLUCOSE, CAPILLARY - Abnormal; Notable for the following:    Glucose-Capillary 144 (*)    All other components within normal limits  TROPONIN I   ____________________________________________  EKG  ED ECG REPORT I, Doran Stabler, the attending physician, personally viewed and interpreted this ECG.   Date: 04/04/2016  EKG Time: 1833  Rate: 66  Rhythm: normal sinus rhythm  Axis: Normal  Intervals:Prolonged QT interval at 538  ST&T Change: No ST segment elevation or depression. No abnormal T-wave inversion.  ED ECG REPORT I, Doran Stabler, the attending physician, personally viewed and interpreted this ECG.   Date: 04/04/2016  EKG Time: 2139  Rate: 58  Rhythm: sinus bradycardia  Axis: Normal  Intervals:none  ST&T Change: No ST segment elevation or depression. No abnormal T-wave inversion.  ____________________________________________  RADIOLOGY  CT Head Wo Contrast (Accession [id number]) (Order [id number])  Imaging  Date: 04/04/2016 Department: [facility] EMERGENCY DEPARTMENT Released By/Authorizing: [name], MD  (auto-released)  Exam Information   Status Exam Begun  Exam Ended   Final [99] 04/04/2016 8:08 PM 04/04/2016 8:17 PM  PACS Images   Show images for CT Head Wo Contrast  Study Result   CLINICAL DATA:  Seizures.  EXAM: CT HEAD WITHOUT CONTRAST  TECHNIQUE: Contiguous axial images were obtained from the base of the skull through the vertex without intravenous contrast.  COMPARISON:  CT scan of March 22, 2016.  FINDINGS: Brain: No mass effect or midline shift is noted. Ventricular size is within normal limits. There is no evidence of mass lesion, hemorrhage or acute infarction.  Vascular: No abnormality seen.  Skull: Bony calvarium appears intact.  Sinuses/Orbits: Visualized paranasal sinuses appear normal.  Other: None.  IMPRESSION: Normal head CT.   Electronically Signed   By: Marijo Conception, M.D.   On: 04/04/2016 21:07   DG Chest 1 View (Accession [id number]) (Order [id number])  Imaging  Date: 04/04/2016 Department: [facility] EMERGENCY DEPARTMENT Released By/Authorizing: [name], MD (auto-released)  Exam Information   Status Exam Begun  Exam Ended   Final [99] 04/04/2016 6:51 PM 04/04/2016 6:58 PM  PACS Images   Show images for DG Chest 1 View  Study Result   CLINICAL DATA:  Seizures.  EXAM: CHEST 1 VIEW  COMPARISON:  03/12/2016  FINDINGS: Mild cardiac enlargement. No pleural effusions or edema identified. Aortic atherosclerosis noted.  IMPRESSION: 1. No acute cardiopulmonary abnormalities.   Electronically Signed   By: Kerby Moors M.D.   On: 04/04/2016 19:08     ____________________________________________   PROCEDURES  Procedure(s) performed:   Procedures  Critical Care performed:   ____________________________________________   INITIAL IMPRESSION / ASSESSMENT AND PLAN / ED COURSE  Pertinent labs & imaging results that were available during my care of the patient  were reviewed by me and considered in my medical decision making (see chart for details).     Clinical Course  Comment By Time  Discussed the case with Dr. Leonel Ramsay of [facility] who recommends increasing the patient's Keppra to 1250 twice a day. Patient with QTC of 538 on the initial EKG. However, there is a poor baseline. We'll repeat to hopefully obtain a better baseline and a more accurate read on the patient's QTc interval. [name], MD 09/29 2110   ----------------------------------------- 9:45 PM on 04/04/2016 -----------------------------------------  Again discussed case with the patient via the sign language interpreter at the bedside. The patient continues to be her baseline. Is on her baseline 2 L of nasal cannula oxygen which she wears at night and then as needed during the day. She is satting 95% on this 2 L of nasal cannula oxygen. Her the plan to increase her dose of Keppra 1250 twice a day and I will be prescribing an additional 250 mg per day that she will take in addition to her current 1 g per day. She is understanding of this plan and willing to comply. She has a follow-up appointment already scheduled with her doctor on October 10, neurologist Dr. Manuella Ghazi.  She continues to be her baseline. No focal neurological deficits. Alert and oriented. Pain free. Very reassuring labs and imaging. Repeat EKG with normal QTC.  ____________________________________________   FINAL CLINICAL IMPRESSION(S) / ED DIAGNOSES  Seizure.    NEW MEDICATIONS STARTED DURING THIS VISIT:  New Prescriptions   No medications on file     Note:  This document was prepared using Dragon voice recognition software and may include unintentional dictation errors.    [name], MD 04/04/16 2148  Tongue examined without evidence of bite or laceration.   [name], MD 04/04/16 2150

## 2016-07-10 ENCOUNTER — Emergency Department: Payer: BLUE CROSS/BLUE SHIELD

## 2016-07-10 ENCOUNTER — Inpatient Hospital Stay
Admission: EM | Admit: 2016-07-10 | Discharge: 2016-07-17 | DRG: 190 | Disposition: A | Discharge status: Home Health | Payer: BLUE CROSS/BLUE SHIELD | Attending: Internal Medicine | Admitting: Internal Medicine

## 2016-07-10 DIAGNOSIS — E1140 Type 2 diabetes mellitus with diabetic neuropathy, unspecified: Secondary | ICD-10-CM | POA: Diagnosis present

## 2016-07-10 DIAGNOSIS — I2510 Atherosclerotic heart disease of native coronary artery without angina pectoris: Secondary | ICD-10-CM | POA: Diagnosis present

## 2016-07-10 DIAGNOSIS — Z881 Allergy status to other antibiotic agents status: Secondary | ICD-10-CM

## 2016-07-10 DIAGNOSIS — Z8249 Family history of ischemic heart disease and other diseases of the circulatory system: Secondary | ICD-10-CM | POA: Diagnosis not present

## 2016-07-10 DIAGNOSIS — J441 Chronic obstructive pulmonary disease with (acute) exacerbation: Secondary | ICD-10-CM | POA: Diagnosis present

## 2016-07-10 DIAGNOSIS — R0902 Hypoxemia: Secondary | ICD-10-CM | POA: Diagnosis present

## 2016-07-10 DIAGNOSIS — Z7984 Long term (current) use of oral hypoglycemic drugs: Secondary | ICD-10-CM

## 2016-07-10 DIAGNOSIS — I110 Hypertensive heart disease with heart failure: Secondary | ICD-10-CM | POA: Diagnosis present

## 2016-07-10 DIAGNOSIS — K7469 Other cirrhosis of liver: Secondary | ICD-10-CM | POA: Diagnosis present

## 2016-07-10 DIAGNOSIS — Z9981 Dependence on supplemental oxygen: Secondary | ICD-10-CM | POA: Diagnosis not present

## 2016-07-10 DIAGNOSIS — G629 Polyneuropathy, unspecified: Secondary | ICD-10-CM | POA: Diagnosis present

## 2016-07-10 DIAGNOSIS — H9190 Unspecified hearing loss, unspecified ear: Secondary | ICD-10-CM | POA: Diagnosis present

## 2016-07-10 DIAGNOSIS — Z7951 Long term (current) use of inhaled steroids: Secondary | ICD-10-CM

## 2016-07-10 DIAGNOSIS — G40909 Epilepsy, unspecified, not intractable, without status epilepticus: Secondary | ICD-10-CM | POA: Diagnosis present

## 2016-07-10 DIAGNOSIS — T426X6A Underdosing of other antiepileptic and sedative-hypnotic drugs, initial encounter: Secondary | ICD-10-CM | POA: Diagnosis present

## 2016-07-10 DIAGNOSIS — Z8619 Personal history of other infectious and parasitic diseases: Secondary | ICD-10-CM

## 2016-07-10 DIAGNOSIS — F329 Major depressive disorder, single episode, unspecified: Secondary | ICD-10-CM | POA: Diagnosis present

## 2016-07-10 DIAGNOSIS — R079 Chest pain, unspecified: Secondary | ICD-10-CM | POA: Diagnosis not present

## 2016-07-10 DIAGNOSIS — Z886 Allergy status to analgesic agent status: Secondary | ICD-10-CM

## 2016-07-10 DIAGNOSIS — J440 Chronic obstructive pulmonary disease with acute lower respiratory infection: Secondary | ICD-10-CM | POA: Diagnosis present

## 2016-07-10 DIAGNOSIS — G4730 Sleep apnea, unspecified: Secondary | ICD-10-CM | POA: Diagnosis present

## 2016-07-10 DIAGNOSIS — Z79899 Other long term (current) drug therapy: Secondary | ICD-10-CM

## 2016-07-10 DIAGNOSIS — Z96659 Presence of unspecified artificial knee joint: Secondary | ICD-10-CM | POA: Diagnosis present

## 2016-07-10 DIAGNOSIS — E876 Hypokalemia: Secondary | ICD-10-CM | POA: Diagnosis present

## 2016-07-10 DIAGNOSIS — R0602 Shortness of breath: Secondary | ICD-10-CM

## 2016-07-10 DIAGNOSIS — G2581 Restless legs syndrome: Secondary | ICD-10-CM | POA: Diagnosis present

## 2016-07-10 DIAGNOSIS — K219 Gastro-esophageal reflux disease without esophagitis: Secondary | ICD-10-CM | POA: Diagnosis present

## 2016-07-10 DIAGNOSIS — R569 Unspecified convulsions: Secondary | ICD-10-CM | POA: Diagnosis not present

## 2016-07-10 DIAGNOSIS — Z8673 Personal history of transient ischemic attack (TIA), and cerebral infarction without residual deficits: Secondary | ICD-10-CM | POA: Diagnosis not present

## 2016-07-10 DIAGNOSIS — F17210 Nicotine dependence, cigarettes, uncomplicated: Secondary | ICD-10-CM | POA: Diagnosis present

## 2016-07-10 DIAGNOSIS — Z7902 Long term (current) use of antithrombotics/antiplatelets: Secondary | ICD-10-CM

## 2016-07-10 DIAGNOSIS — M6281 Muscle weakness (generalized): Secondary | ICD-10-CM

## 2016-07-10 DIAGNOSIS — E1165 Type 2 diabetes mellitus with hyperglycemia: Secondary | ICD-10-CM | POA: Diagnosis present

## 2016-07-10 DIAGNOSIS — R262 Difficulty in walking, not elsewhere classified: Secondary | ICD-10-CM

## 2016-07-10 DIAGNOSIS — I5031 Acute diastolic (congestive) heart failure: Secondary | ICD-10-CM | POA: Diagnosis present

## 2016-07-10 DIAGNOSIS — Z91128 Patient's intentional underdosing of medication regimen for other reason: Secondary | ICD-10-CM

## 2016-07-10 DIAGNOSIS — Z882 Allergy status to sulfonamides status: Secondary | ICD-10-CM

## 2016-07-10 DIAGNOSIS — J189 Pneumonia, unspecified organism: Secondary | ICD-10-CM | POA: Diagnosis present

## 2016-07-10 DIAGNOSIS — Z885 Allergy status to narcotic agent status: Secondary | ICD-10-CM

## 2016-07-10 DIAGNOSIS — Z888 Allergy status to other drugs, medicaments and biological substances status: Secondary | ICD-10-CM

## 2016-07-10 DIAGNOSIS — Z792 Long term (current) use of antibiotics: Secondary | ICD-10-CM

## 2016-07-10 LAB — COMPREHENSIVE METABOLIC PANEL
ALBUMIN: 3.2 g/dL — AB (ref 3.5–5.0)
ALK PHOS: 113 U/L (ref 38–126)
ALT: 33 U/L (ref 14–54)
ANION GAP: 11 (ref 5–15)
AST: 53 U/L — AB (ref 15–41)
BUN: 11 mg/dL (ref 6–20)
CALCIUM: 9.2 mg/dL (ref 8.9–10.3)
CO2: 30 mmol/L (ref 22–32)
Chloride: 93 mmol/L — ABNORMAL LOW (ref 101–111)
Creatinine, Ser: 0.69 mg/dL (ref 0.44–1.00)
GFR calc Af Amer: >60 mL/min (ref >60)
GFR calc non Af Amer: >60 mL/min (ref >60)
GLUCOSE: 348 mg/dL — AB (ref 65–99)
Potassium: 2.9 mmol/L — ABNORMAL LOW (ref 3.5–5.1)
SODIUM: 134 mmol/L — AB (ref 135–145)
Total Bilirubin: 1.1 mg/dL (ref 0.3–1.2)
Total Protein: 6.7 g/dL (ref 6.5–8.1)

## 2016-07-10 LAB — CBC WITH DIFFERENTIAL/PLATELET
BASOS ABS: 0 10*3/uL (ref 0–0.1)
Basophils Relative: 1 %
EOS ABS: 0 10*3/uL (ref 0–0.7)
Eosinophils Relative: 0 %
HCT: 48 % — ABNORMAL HIGH (ref 35.0–47.0)
HEMOGLOBIN: 16.3 g/dL — AB (ref 12.0–16.0)
Lymphocytes Relative: 23 %
Lymphs Abs: 1.7 10*3/uL (ref 1.0–3.6)
MCH: 28.5 pg (ref 26.0–34.0)
MCHC: 34 g/dL (ref 32.0–36.0)
MCV: 83.8 fL (ref 80.0–100.0)
Monocytes Absolute: 0.5 10*3/uL (ref 0.2–0.9)
Monocytes Relative: 7 %
NEUTROS ABS: 5 10*3/uL (ref 1.4–6.5)
NEUTROS PCT: 69 %
Platelets: 212 10*3/uL (ref 150–440)
RBC: 5.73 MIL/uL — AB (ref 3.80–5.20)
RDW: 13.9 % (ref 11.5–14.5)
WBC: 7.3 10*3/uL (ref 3.6–11.0)

## 2016-07-10 LAB — TROPONIN I: Troponin I: <0.03 ng/mL (ref <0.03)

## 2016-07-10 MED ORDER — MAGNESIUM SULFATE 2 GM/50ML IV SOLN
2.0000 g | Freq: Once | INTRAVENOUS | Status: AC
  Administered 2016-07-10: 2 g via INTRAVENOUS

## 2016-07-10 MED ORDER — IPRATROPIUM-ALBUTEROL 0.5-2.5 (3) MG/3ML IN SOLN
3.0000 mL | Freq: Once | RESPIRATORY_TRACT | Status: AC
Start: 2016-07-10 — End: 2016-07-10
  Administered 2016-07-10: 3 mL via RESPIRATORY_TRACT
  Filled 2016-07-10: qty 3

## 2016-07-10 MED ORDER — MAGNESIUM SULFATE 2 GM/50ML IV SOLN
INTRAVENOUS | Status: AC
  Filled 2016-07-10: qty 50

## 2016-07-10 MED ORDER — METHYLPREDNISOLONE SODIUM SUCC 125 MG IJ SOLR
125.0000 mg | Freq: Once | INTRAMUSCULAR | Status: AC
  Administered 2016-07-10: 125 mg via INTRAVENOUS
  Filled 2016-07-10: qty 2

## 2016-07-10 MED ORDER — IPRATROPIUM-ALBUTEROL 0.5-2.5 (3) MG/3ML IN SOLN
3.0000 mL | Freq: Once | RESPIRATORY_TRACT | Status: AC
  Administered 2016-07-10: 3 mL via RESPIRATORY_TRACT
  Filled 2016-07-10: qty 3

## 2016-07-10 NOTE — ED Notes (Signed)
Pts daughter sts that she has live interpreter available to come to ED for pt.  Told pts family that this RN would check w/ charge RN.

## 2016-07-10 NOTE — ED Triage Notes (Signed)
Pt bib EMS w/ SOB from home. Per EMS pt has been on 3 antibiotics and 2 rounds of prednisone for same.  Per EMS pt had XR today that showed possible infection.

## 2016-07-10 NOTE — ED Notes (Signed)
Pulled pt up in bed.

## 2016-07-10 NOTE — ED Notes (Signed)
MD Corky Downs made aware pf pts O2 sat 88%.  No new orders given

## 2016-07-10 NOTE — ED Provider Notes (Signed)
[facility] Emergency Department Provider Note   ____________________________________________    I have reviewed the triage vital signs and the nursing notes.   HISTORY  Chief Complaint Shortness of Breath   Sign language interpreter used  HPI [name] is a 66 y.o. female who presents with shortness of breath. Patient has a history of COPD and wears oxygen at home, 2 L. She has had worsening shortness of breath over the last several days. She has tried her albuterol inhalers without improvement. She denies fevers or chills. She does report productive cough. No recent travel. No calf pain or swelling.   Past Medical History:  Diagnosis Date  . Asthma   . Cirrhosis, non-alcoholic ([place])   . COPD (chronic obstructive pulmonary disease) ([place])   . Deaf   . Depression   . Diabetes mellitus without complication ([place])   . GERD (gastroesophageal reflux disease)   . Heart murmur   . Hepatitis   . Hypertension   . Lymph node disorder    arm  . Neuropathy ([place])   . On home oxygen therapy    hs  . Orthopnea   . RLS (restless legs syndrome)   . Seizures ([place])   . Shortness of breath dyspnea   . Sleep apnea   . Stroke [facility])    tia    Patient Active Problem List   Diagnosis Date Noted  . GERD (gastroesophageal reflux disease) 03/23/2016  . Depression 03/23/2016  . Convulsions/seizures ([place]) 03/22/2016  . DM (diabetes mellitus), type 2 ([place]) 03/22/2016  . Essential hypertension 03/22/2016  . Neuropathy ([place]) 03/22/2016  . Uncontrolled seizures ([place]) 03/22/2016  . COPD exacerbation ([place]) 03/31/2015  . Acute respiratory failure with hypoxia ([place]) 03/29/2015    Past Surgical History:  Procedure Laterality Date  . CATARACT EXTRACTION W/PHACO Right 11/23/2014   Procedure: CATARACT EXTRACTION PHACO AND INTRAOCULAR LENS PLACEMENT (IOC);  Surgeon: Lyla Glassing, MD;  Location: [facility] ORS;  Service: Ophthalmology;  Laterality: Right;  .  CATARACT EXTRACTION W/PHACO Left 12/14/2014   Procedure: CATARACT EXTRACTION PHACO AND INTRAOCULAR LENS PLACEMENT (IOC);  Surgeon: Lyla Glassing, MD;  Location: [facility] ORS;  Service: Ophthalmology;  Laterality: Left;  US:01:16.6 AP:15.8 CDE:12.14  . CESAREAN SECTION    . CHOLECYSTECTOMY    . KNEE ARTHROPLASTY    . THUMB ARTHROSCOPY    . TONSILLECTOMY    . TYMPANOPLASTY     muliple    Prior to Admission medications   Medication Sig Start Date End Date Taking? Authorizing Provider  albuterol (PROVENTIL HFA;VENTOLIN HFA) 108 (90 BASE) MCG/ACT inhaler Inhale 2 puffs into the lungs every 4 (four) hours as needed for wheezing or shortness of breath.    Historical Provider, MD  albuterol (PROVENTIL) (2.5 MG/3ML) 0.083% nebulizer solution Take 3 mLs (2.5 mg total) by nebulization every 4 (four) hours as needed for wheezing or shortness of breath. 03/31/15   Srikar Sudini, MD  amLODipine (NORVASC) 10 MG tablet Take 1 tablet by mouth daily. 02/18/16   Historical Provider, MD  carboxymethylcellulose (REFRESH TEARS) 0.5 % SOLN Place 1 drop into both eyes daily as needed.    Historical Provider, MD  citalopram (CELEXA) 20 MG tablet Take 20 mg by mouth daily.    Historical Provider, MD  clopidogrel (PLAVIX) 75 MG tablet Take 75 mg by mouth daily.    Historical Provider, MD  diphenhydrAMINE (SOMINEX) 25 MG tablet Take 25 mg by mouth at bedtime as needed for sleep.    Historical  Provider, MD  doxycycline (VIBRA-TABS) 100 MG tablet Take 1 tablet (100 mg total) by mouth every 12 (twelve) hours. 03/24/16   Oswald Hillock, MD  enalapril (VASOTEC) 20 MG tablet Take 20 mg by mouth daily.    Historical Provider, MD  EPINEPHrine 0.3 mg/0.3 mL IJ SOAJ injection Inject into the muscle once.    Historical Provider, MD  gabapentin (NEURONTIN) 300 MG capsule Take 1 capsule by mouth 3 (three) times daily. 01/16/16   Historical Provider, MD  GLIPIZIDE XL 5 MG 24 hr tablet Take 1 tablet by mouth daily. 02/26/16   Historical  Provider, MD  ipratropium-albuterol (DUONEB) 0.5-2.5 (3) MG/3ML SOLN Take 3 mLs by nebulization every 6 (six) hours as needed.    Historical Provider, MD  levETIRAcetam (KEPPRA) 1000 MG tablet Take 1 tablet (1,000 mg total) by mouth 2 (two) times daily. 03/24/16   Oswald Hillock, MD  levETIRAcetam (KEPPRA) 250 MG tablet Take 1 tablet (250 mg total) by mouth 2 (two) times daily. Take in addition to your prescribed Keppra for a total of 1272m twice a day 04/04/16   DOrbie Pyo MD  mometasone-formoterol ([facility] 200-5 MCG/ACT AERO Inhale 2 puffs into the lungs 2 (two) times daily. 03/24/16   GOswald Hillock MD  montelukast (SINGULAIR) 10 MG tablet Take 1 tablet by mouth at bedtime. 03/18/16   Historical Provider, MD  naproxen (NAPROSYN) 500 MG tablet Take 500 mg by mouth 2 (two) times daily with a meal.    Historical Provider, MD  nitrofurantoin, macrocrystal-monohydrate, (MACROBID) 100 MG capsule Take 1 capsule by mouth 2 (two) times daily. 03/20/16   Historical Provider, MD  omeprazole (PRILOSEC) 20 MG capsule Take 20 mg by mouth daily.    Historical Provider, MD  predniSONE (DELTASONE) 10 MG tablet Prednisone 40 mg po daily x 1 day then Prednisone 30 mg po daily x 1 day then Prednisone 20 mg po daily x 1 day then Prednisone 10 mg daily x 1 day then stop... 03/24/16   GOswald Hillock MD  rOPINIRole (REQUIP) 0.25 MG tablet Take 1 tablet (0.25 mg total) by mouth 3 (three) times daily. 03/24/16   GOswald Hillock MD     Allergies Aspirin; Celebrex [celecoxib]; Ciprofloxacin; Codeine; Fosphenytoin; Levaquin [levofloxacin in d5w]; Levofloxacin; Penicillins; and Sulfa antibiotics  Family History  Problem Relation Age of Onset  . Lung cancer Mother   . CAD Father     Social History Social History  Substance Use Topics  . Smoking status: Current Every Day Smoker    Packs/day: 0.50  . Smokeless tobacco: Never Used  . Alcohol use No    Review of Systems  Constitutional: No  fever/chills  Cardiovascular: Denies chest pain. Respiratory:Positive cough Gastrointestinal: No nausea, no vomiting.    Musculoskeletal: Negative for leg swelling Skin: Negative for rash.   10-point ROS otherwise negative.  ____________________________________________   PHYSICAL EXAM:  VITAL SIGNS: ED Triage Vitals  Enc Vitals Group     BP 07/10/16 2006 137/67     Pulse Rate 07/10/16 2006 79     Resp 07/10/16 2006 (!) 31     Temp 07/10/16 2006 97.7 F (36.5 C)     Temp Source 07/10/16 2006 Oral     SpO2 07/10/16 2006 94 %     Weight 07/10/16 1957 165 lb (74.8 kg)     Height 07/10/16 1957 _0  (1.6 m)     Head Circumference --      Peak Flow --  Pain Score --      Pain Loc --      Pain Edu? --      Excl. in [place]? --     Constitutional: Alert and oriented.  Eyes: Conjunctivae are normal.   Nose: No congestion/rhinnorhea. Mouth/Throat: Mucous membranes are moist.    Cardiovascular: Normal rate, regular rhythm. Grossly normal heart sounds.  Good peripheral circulation. Respiratory: Increased respiratory effort with tachypnea. Scattered wheezes, poor airflow Gastrointestinal: Soft and nontender. No distention.  No CVA tenderness. Genitourinary: deferred Musculoskeletal: No lower extremity tenderness nor edema.  Warm and well perfused Neurologic:  No gross focal neurologic deficits are appreciated.  Skin:  Skin is warm, dry and intact. No rash noted.   ____________________________________________   LABS (all labs ordered are listed, but only abnormal results are displayed)  Labs Reviewed  CBC WITH DIFFERENTIAL/PLATELET - Abnormal; Notable for the following:       Result Value   RBC 5.73 (*)    Hemoglobin 16.3 (*)    HCT 48.0 (*)    All other components within normal limits  COMPREHENSIVE METABOLIC PANEL - Abnormal; Notable for the following:    Sodium 134 (*)    Potassium 2.9 (*)    Chloride 93 (*)    Glucose, Bld 348 (*)    Albumin 3.2 (*)    AST  53 (*)    All other components within normal limits  CULTURE, BLOOD (ROUTINE X 2)  CULTURE, BLOOD (ROUTINE X 2)  TROPONIN I   ____________________________________________  EKG  None ____________________________________________  RADIOLOGY  Chest x-ray unremarkable ____________________________________________   PROCEDURES  Procedure(s) performed: No    Critical Care performed:No ____________________________________________   INITIAL IMPRESSION / ASSESSMENT AND PLAN / ED COURSE  Pertinent labs & imaging results that were available during my care of the patient were reviewed by me and considered in my medical decision making (see chart for details).  Patient presents with shortness of breath, likely related to COPD exacerbation given her wheezing on arrival. She is requiring increased O2 to keep her oxygen saturations above 90%, it sounds like her baseline is around 91%. Fevers or chills reported however she does have cough. X-ray does not demonstrate pneumonia. Lab work is otherwise unremarkable. We will admit for further treatment  Clinical Course    ____________________________________________   FINAL CLINICAL IMPRESSION(S) / ED DIAGNOSES  Final diagnoses:  COPD exacerbation ([place])  Shortness of breath      NEW MEDICATIONS STARTED DURING THIS VISIT:  New Prescriptions   No medications on file     Note:  This document was prepared using Dragon voice recognition software and may include unintentional dictation errors.    Lavonia Drafts, MD 07/10/16 2233

## 2016-07-10 NOTE — ED Notes (Signed)
Pt O2 sat 87% on 5 L.  MD Corky Downs informed and at bedside.

## 2016-07-11 LAB — BASIC METABOLIC PANEL
ANION GAP: 9 (ref 5–15)
BUN: 13 mg/dL (ref 6–20)
CALCIUM: 9 mg/dL (ref 8.9–10.3)
CO2: 27 mmol/L (ref 22–32)
Chloride: 96 mmol/L — ABNORMAL LOW (ref 101–111)
Creatinine, Ser: 0.54 mg/dL (ref 0.44–1.00)
GLUCOSE: 398 mg/dL — AB (ref 65–99)
POTASSIUM: 3.7 mmol/L (ref 3.5–5.1)
Sodium: 132 mmol/L — ABNORMAL LOW (ref 135–145)

## 2016-07-11 LAB — CBC
HEMATOCRIT: 47.5 % — AB (ref 35.0–47.0)
HEMOGLOBIN: 16 g/dL (ref 12.0–16.0)
MCH: 28.2 pg (ref 26.0–34.0)
MCHC: 33.6 g/dL (ref 32.0–36.0)
MCV: 84.2 fL (ref 80.0–100.0)
Platelets: 193 10*3/uL (ref 150–440)
RBC: 5.65 MIL/uL — AB (ref 3.80–5.20)
RDW: 13.7 % (ref 11.5–14.5)
WBC: 6 10*3/uL (ref 3.6–11.0)

## 2016-07-11 LAB — MRSA PCR SCREENING: MRSA BY PCR: POSITIVE — AB

## 2016-07-11 LAB — GLUCOSE, CAPILLARY
GLUCOSE-CAPILLARY: 340 mg/dL — AB (ref 65–99)
GLUCOSE-CAPILLARY: 240 mg/dL — AB (ref 65–99)
Glucose-Capillary: 270 mg/dL — ABNORMAL HIGH (ref 65–99)
Glucose-Capillary: 200 mg/dL — ABNORMAL HIGH (ref 65–99)

## 2016-07-11 MED ORDER — SODIUM CHLORIDE 0.9 % IV SOLN
30.0000 meq | Freq: Once | INTRAVENOUS | Status: AC
  Administered 2016-07-11: 30 meq via INTRAVENOUS
  Filled 2016-07-11: qty 15

## 2016-07-11 MED ORDER — ACETAMINOPHEN 325 MG PO TABS: 650.0000 mg | ORAL_TABLET | Freq: Four times a day (QID) | ORAL | Status: DC | PRN

## 2016-07-11 MED ORDER — MAGNESIUM CITRATE PO SOLN: 1.0000 | Freq: Once | ORAL | Status: DC | PRN

## 2016-07-11 MED ORDER — LEVETIRACETAM 500 MG PO TABS
1000.0000 mg | ORAL_TABLET | Freq: Two times a day (BID) | ORAL | Status: DC
  Administered 2016-07-11 – 2016-07-12 (×2): 1000 mg via ORAL
  Filled 2016-07-11 (×2): qty 2

## 2016-07-11 MED ORDER — PANTOPRAZOLE SODIUM 40 MG PO TBEC
40.0000 mg | DELAYED_RELEASE_TABLET | Freq: Every day | ORAL | Status: DC
  Administered 2016-07-11 – 2016-07-17 (×7): 40 mg via ORAL
  Filled 2016-07-11 (×7): qty 1

## 2016-07-11 MED ORDER — GABAPENTIN 300 MG PO CAPS
300.0000 mg | ORAL_CAPSULE | Freq: Three times a day (TID) | ORAL | Status: DC
  Administered 2016-07-11 – 2016-07-17 (×19): 300 mg via ORAL
  Filled 2016-07-11 (×19): qty 1

## 2016-07-11 MED ORDER — INSULIN ASPART 100 UNIT/ML ~~LOC~~ SOLN
0.0000 [IU] | Freq: Every day | SUBCUTANEOUS | Status: DC
  Administered 2016-07-12: 22:00:00 100 [IU] via SUBCUTANEOUS
  Administered 2016-07-13 – 2016-07-14 (×2): 2 [IU] via SUBCUTANEOUS
  Filled 2016-07-11 (×3): qty 2

## 2016-07-11 MED ORDER — SODIUM CHLORIDE 0.9 % IV SOLN
INTRAVENOUS | Status: DC
  Administered 2016-07-11 (×2): via INTRAVENOUS

## 2016-07-11 MED ORDER — ONDANSETRON HCL 4 MG PO TABS: 4.0000 mg | ORAL_TABLET | Freq: Four times a day (QID) | ORAL | Status: DC | PRN

## 2016-07-11 MED ORDER — SODIUM CHLORIDE 0.9% FLUSH
3.0000 mL | Freq: Two times a day (BID) | INTRAVENOUS | Status: DC
  Administered 2016-07-11 – 2016-07-16 (×11): 3 mL via INTRAVENOUS

## 2016-07-11 MED ORDER — ENOXAPARIN SODIUM 40 MG/0.4ML ~~LOC~~ SOLN
40.0000 mg | Freq: Every day | SUBCUTANEOUS | Status: DC
  Administered 2016-07-11 – 2016-07-16 (×7): 40 mg via SUBCUTANEOUS
  Filled 2016-07-11 (×7): qty 0.4

## 2016-07-11 MED ORDER — GUAIFENESIN ER 600 MG PO TB12
600.0000 mg | ORAL_TABLET | Freq: Two times a day (BID) | ORAL | Status: DC
  Administered 2016-07-11 – 2016-07-17 (×14): 600 mg via ORAL
  Filled 2016-07-11 (×15): qty 1

## 2016-07-11 MED ORDER — DEXTROMETHORPHAN POLISTIREX ER 30 MG/5ML PO SUER
30.0000 mg | Freq: Two times a day (BID) | ORAL | Status: DC
  Administered 2016-07-11 – 2016-07-17 (×14): 30 mg via ORAL
  Filled 2016-07-11 (×16): qty 5

## 2016-07-11 MED ORDER — INSULIN ASPART 100 UNIT/ML ~~LOC~~ SOLN
0.0000 [IU] | Freq: Three times a day (TID) | SUBCUTANEOUS | Status: DC
  Administered 2016-07-11: 10:00:00 11 [IU] via SUBCUTANEOUS
  Administered 2016-07-11: 8 [IU] via SUBCUTANEOUS
  Filled 2016-07-11: qty 11
  Filled 2016-07-11: qty 8

## 2016-07-11 MED ORDER — POTASSIUM CHLORIDE CRYS ER 20 MEQ PO TBCR
40.0000 meq | EXTENDED_RELEASE_TABLET | Freq: Two times a day (BID) | ORAL | Status: AC
  Administered 2016-07-11 (×2): 40 meq via ORAL
  Filled 2016-07-11 (×2): qty 2

## 2016-07-11 MED ORDER — CITALOPRAM HYDROBROMIDE 20 MG PO TABS
20.0000 mg | ORAL_TABLET | Freq: Every day | ORAL | Status: DC
  Administered 2016-07-11 – 2016-07-17 (×7): 20 mg via ORAL
  Filled 2016-07-11 (×7): qty 1

## 2016-07-11 MED ORDER — INSULIN ASPART 100 UNIT/ML ~~LOC~~ SOLN
0.0000 [IU] | Freq: Three times a day (TID) | SUBCUTANEOUS | Status: DC
  Administered 2016-07-11 – 2016-07-12 (×2): 7 [IU] via SUBCUTANEOUS
  Administered 2016-07-12: 09:00:00 11 [IU] via SUBCUTANEOUS
  Administered 2016-07-12: 4 [IU] via SUBCUTANEOUS
  Administered 2016-07-13 – 2016-07-14 (×4): 11 [IU] via SUBCUTANEOUS
  Administered 2016-07-15: 3 [IU] via SUBCUTANEOUS
  Administered 2016-07-15: 11 [IU] via SUBCUTANEOUS
  Administered 2016-07-15: 18:00:00 15 [IU] via SUBCUTANEOUS
  Administered 2016-07-16: 13:00:00 11 [IU] via SUBCUTANEOUS
  Administered 2016-07-16: 20 [IU] via SUBCUTANEOUS
  Administered 2016-07-16 – 2016-07-17 (×2): 3 [IU] via SUBCUTANEOUS
  Filled 2016-07-11 (×2): qty 3
  Filled 2016-07-11: qty 7
  Filled 2016-07-11: qty 11
  Filled 2016-07-11: qty 15
  Filled 2016-07-11: qty 3
  Filled 2016-07-11: qty 11
  Filled 2016-07-11: qty 4
  Filled 2016-07-11 (×2): qty 11
  Filled 2016-07-11: qty 20
  Filled 2016-07-11 (×2): qty 11
  Filled 2016-07-11: qty 7
  Filled 2016-07-11: qty 11

## 2016-07-11 MED ORDER — DEXTROSE 5 % IV SOLN
500.0000 mg | Freq: Every day | INTRAVENOUS | Status: AC
  Administered 2016-07-11 – 2016-07-15 (×5): 500 mg via INTRAVENOUS
  Filled 2016-07-11 (×6): qty 500

## 2016-07-11 MED ORDER — CLOPIDOGREL BISULFATE 75 MG PO TABS
75.0000 mg | ORAL_TABLET | Freq: Every day | ORAL | Status: DC
  Administered 2016-07-11 – 2016-07-17 (×7): 75 mg via ORAL
  Filled 2016-07-11 (×7): qty 1

## 2016-07-11 MED ORDER — LEVETIRACETAM 250 MG PO TABS
250.0000 mg | ORAL_TABLET | Freq: Two times a day (BID) | ORAL | Status: DC
  Filled 2016-07-11 (×3): qty 1

## 2016-07-11 MED ORDER — IPRATROPIUM-ALBUTEROL 0.5-2.5 (3) MG/3ML IN SOLN
3.0000 mL | Freq: Four times a day (QID) | RESPIRATORY_TRACT | Status: DC | PRN
  Administered 2016-07-11 – 2016-07-12 (×4): 3 mL via RESPIRATORY_TRACT
  Filled 2016-07-11 (×4): qty 3

## 2016-07-11 MED ORDER — ROPINIROLE HCL 0.25 MG PO TABS
0.2500 mg | ORAL_TABLET | Freq: Three times a day (TID) | ORAL | Status: DC
  Administered 2016-07-11 – 2016-07-17 (×19): 0.25 mg via ORAL
  Filled 2016-07-11 (×19): qty 1

## 2016-07-11 MED ORDER — SENNOSIDES-DOCUSATE SODIUM 8.6-50 MG PO TABS: 1.0000 | ORAL_TABLET | Freq: Every evening | ORAL | Status: DC | PRN

## 2016-07-11 MED ORDER — BISACODYL 5 MG PO TBEC
5.0000 mg | DELAYED_RELEASE_TABLET | Freq: Every day | ORAL | Status: DC | PRN
  Administered 2016-07-15: 5 mg via ORAL
  Filled 2016-07-11: qty 1

## 2016-07-11 MED ORDER — MONTELUKAST SODIUM 10 MG PO TABS
10.0000 mg | ORAL_TABLET | Freq: Every day | ORAL | Status: DC
  Administered 2016-07-11 – 2016-07-16 (×6): 10 mg via ORAL
  Filled 2016-07-11 (×6): qty 1

## 2016-07-11 MED ORDER — ACETAMINOPHEN 650 MG RE SUPP: 650.0000 mg | Freq: Four times a day (QID) | RECTAL | Status: DC | PRN

## 2016-07-11 MED ORDER — MOMETASONE FURO-FORMOTEROL FUM 200-5 MCG/ACT IN AERO
2.0000 | INHALATION_SPRAY | Freq: Two times a day (BID) | RESPIRATORY_TRACT | Status: DC
  Administered 2016-07-11 – 2016-07-17 (×14): 2 via RESPIRATORY_TRACT
  Filled 2016-07-11: qty 8.8

## 2016-07-11 MED ORDER — ONDANSETRON HCL 4 MG/2ML IJ SOLN: 4.0000 mg | Freq: Four times a day (QID) | INTRAMUSCULAR | Status: DC | PRN

## 2016-07-11 MED ORDER — NAPROXEN 500 MG PO TABS
500.0000 mg | ORAL_TABLET | Freq: Two times a day (BID) | ORAL | Status: DC
  Administered 2016-07-11 – 2016-07-14 (×6): 500 mg via ORAL
  Filled 2016-07-11 (×6): qty 1

## 2016-07-11 MED ORDER — METHYLPREDNISOLONE SODIUM SUCC 125 MG IJ SOLR
60.0000 mg | Freq: Four times a day (QID) | INTRAMUSCULAR | Status: DC
  Administered 2016-07-11 (×2): 60 mg via INTRAVENOUS
  Filled 2016-07-11 (×2): qty 2

## 2016-07-11 MED ORDER — METHYLPREDNISOLONE SODIUM SUCC 40 MG IJ SOLR
40.0000 mg | Freq: Three times a day (TID) | INTRAMUSCULAR | Status: DC
  Administered 2016-07-11 – 2016-07-14 (×9): 40 mg via INTRAVENOUS
  Filled 2016-07-11 (×9): qty 1

## 2016-07-11 MED ORDER — ENALAPRIL MALEATE 20 MG PO TABS
20.0000 mg | ORAL_TABLET | Freq: Every day | ORAL | Status: DC
  Administered 2016-07-11 – 2016-07-17 (×7): 20 mg via ORAL
  Filled 2016-07-11 (×7): qty 1

## 2016-07-11 MED ORDER — DM-GUAIFENESIN ER 30-600 MG PO TB12: 1.0000 | ORAL_TABLET | Freq: Two times a day (BID) | ORAL | Status: DC

## 2016-07-11 MED ORDER — AMLODIPINE BESYLATE 10 MG PO TABS
10.0000 mg | ORAL_TABLET | Freq: Every day | ORAL | Status: DC
  Administered 2016-07-11 – 2016-07-17 (×7): 10 mg via ORAL
  Filled 2016-07-11 (×7): qty 1

## 2016-07-11 NOTE — H&P (Signed)
History and Physical   [facility] - [place] @ [facility] Admission History and Physical [employer], D.O.    Patient Name: [name] MR#: [id number] Date of Birth: Nov 27, 1950 Date of Admission: 07/10/2016  Referring MD/NP/PA: Dr. Corky Downs Primary Care Physician: Donnie Coffin, MD Outpatient Specialists: Dr. Raul Del  Patient coming from: [facility] patient is deaf and the interview was obtained with the help of a sign language translator.. Chief Complaint: SOB  HPI: [name] is a 66 y.o. female with a known history of COPD, home O2 dependent presents to the emergency department for evaluation of SOB.  Patient was in a usual state of health until Thanksgiving when her baseline SOB started to become worse.  She has seen her physicians several times since then, most recently yesterday. She noticed worsening shortness of breath, dyspnea on exertion cough productive of clear to white sputum over the past several days which has been refractory to her albuterol.   Otherwise there has been no change in status. Patient has been taking medication as prescribed and there has been no recent change in medication or diet.  There has been no recent illness, Hospital is a, travel or sick contacts.    Patient denies fevers/chills, weakness, dizziness, chest pain, N/V/C/D, abdominal pain, dysuria/frequency, changes in mental status.   ED Course: DuoNeb 2, magnesium and Solu-Medrol.  Review of Systems:  CONSTITUTIONAL: No fever/chills, fatigue, weakness, weight gain/loss, headache. EYES: No blurry or double vision. ENT: No tinnitus, postnasal drip, redness or soreness of the oropharynx. RESPIRATORY: Positive cough, dyspnea, wheeze, negative hemoptysis.  CARDIOVASCULAR: No chest pain, palpitations, syncope, orthopnea,  GASTROINTESTINAL: No nausea, vomiting, abdominal pain, constipation, diarrhea.  No hematemesis, melena or hematochezia. GENITOURINARY: No dysuria, frequency,  hematuria. ENDOCRINE: No polyuria or nocturia. No heat or cold intolerance. HEMATOLOGY: No anemia, bruising, bleeding. INTEGUMENTARY: No rashes, ulcers, lesions. MUSCULOSKELETAL: No arthritis, gout, dyspnea.  NEUROLOGIC: No numbness, tingling, ataxia, seizure-type activity, weakness. PSYCHIATRIC: No anxiety, depression, insomnia.   Past Medical History:  Diagnosis Date  . Asthma   . Cirrhosis, non-alcoholic ([place])   . COPD (chronic obstructive pulmonary disease) ([place])   . Deaf   . Depression   . Diabetes mellitus without complication ([place])   . GERD (gastroesophageal reflux disease)   . Heart murmur   . Hepatitis   . Hypertension   . Lymph node disorder    arm  . Neuropathy ([place])   . On home oxygen therapy    hs  . Orthopnea   . RLS (restless legs syndrome)   . Seizures ([place])   . Shortness of breath dyspnea   . Sleep apnea   . Stroke [facility])    tia    Past Surgical History:  Procedure Laterality Date  . CATARACT EXTRACTION W/PHACO Right 11/23/2014   Procedure: CATARACT EXTRACTION PHACO AND INTRAOCULAR LENS PLACEMENT (IOC);  Surgeon: Lyla Glassing, MD;  Location: [facility] ORS;  Service: Ophthalmology;  Laterality: Right;  . CATARACT EXTRACTION W/PHACO Left 12/14/2014   Procedure: CATARACT EXTRACTION PHACO AND INTRAOCULAR LENS PLACEMENT (IOC);  Surgeon: Lyla Glassing, MD;  Location: [facility] ORS;  Service: Ophthalmology;  Laterality: Left;  US:01:16.6 AP:15.8 CDE:12.14  . CESAREAN SECTION    . CHOLECYSTECTOMY    . KNEE ARTHROPLASTY    . THUMB ARTHROSCOPY    . TONSILLECTOMY    . TYMPANOPLASTY     muliple     reports that she has been smoking.  She has been smoking about 0.50 packs per day. She has never  used smokeless tobacco. She reports that she does not drink alcohol or use drugs.  Allergies  Allergen Reactions  . Aspirin   . Celebrex [Celecoxib]   . Ciprofloxacin   . Codeine   . Fosphenytoin   . Levaquin [Levofloxacin In D5w]   . Levofloxacin Other (See Comments)   . Lovastatin   . Penicillins   . Pravastatin   . Sulfa Antibiotics     Family History  Problem Relation Age of Onset  . Lung cancer Mother   . CAD Father    Family history has been reviewed and confirmed with patient.   Prior to Admission medications   Medication Sig Start Date End Date Taking? Authorizing Provider  albuterol (PROVENTIL HFA;VENTOLIN HFA) 108 (90 BASE) MCG/ACT inhaler Inhale 2 puffs into the lungs every 4 (four) hours as needed for wheezing or shortness of breath.    Historical Provider, MD  albuterol (PROVENTIL) (2.5 MG/3ML) 0.083% nebulizer solution Take 3 mLs (2.5 mg total) by nebulization every 4 (four) hours as needed for wheezing or shortness of breath. 03/31/15   Srikar Sudini, MD  amLODipine (NORVASC) 10 MG tablet Take 1 tablet by mouth daily. 02/18/16   Historical Provider, MD  carboxymethylcellulose (REFRESH TEARS) 0.5 % SOLN Place 1 drop into both eyes daily as needed.    Historical Provider, MD  citalopram (CELEXA) 20 MG tablet Take 20 mg by mouth daily.    Historical Provider, MD  clopidogrel (PLAVIX) 75 MG tablet Take 75 mg by mouth daily.    Historical Provider, MD  diphenhydrAMINE (SOMINEX) 25 MG tablet Take 25 mg by mouth at bedtime as needed for sleep.    Historical Provider, MD  doxycycline (VIBRA-TABS) 100 MG tablet Take 1 tablet (100 mg total) by mouth every 12 (twelve) hours. 03/24/16   Oswald Hillock, MD  enalapril (VASOTEC) 20 MG tablet Take 20 mg by mouth daily.    Historical Provider, MD  EPINEPHrine 0.3 mg/0.3 mL IJ SOAJ injection Inject into the muscle once.    Historical Provider, MD  gabapentin (NEURONTIN) 300 MG capsule Take 1 capsule by mouth 3 (three) times daily. 01/16/16   Historical Provider, MD  GLIPIZIDE XL 5 MG 24 hr tablet Take 1 tablet by mouth daily. 02/26/16   Historical Provider, MD  ipratropium-albuterol (DUONEB) 0.5-2.5 (3) MG/3ML SOLN Take 3 mLs by nebulization every 6 (six) hours as needed.    Historical Provider, MD   levETIRAcetam (KEPPRA) 1000 MG tablet Take 1 tablet (1,000 mg total) by mouth 2 (two) times daily. 03/24/16   Oswald Hillock, MD  levETIRAcetam (KEPPRA) 250 MG tablet Take 1 tablet (250 mg total) by mouth 2 (two) times daily. Take in addition to your prescribed Keppra for a total of 1266m twice a day 04/04/16   DOrbie Pyo MD  mometasone-formoterol ([facility] 200-5 MCG/ACT AERO Inhale 2 puffs into the lungs 2 (two) times daily. 03/24/16   GOswald Hillock MD  montelukast (SINGULAIR) 10 MG tablet Take 1 tablet by mouth at bedtime. 03/18/16   Historical Provider, MD  naproxen (NAPROSYN) 500 MG tablet Take 500 mg by mouth 2 (two) times daily with a meal.    Historical Provider, MD  nitrofurantoin, macrocrystal-monohydrate, (MACROBID) 100 MG capsule Take 1 capsule by mouth 2 (two) times daily. 03/20/16   Historical Provider, MD  omeprazole (PRILOSEC) 20 MG capsule Take 20 mg by mouth daily.    Historical Provider, MD  predniSONE (DELTASONE) 10 MG tablet Prednisone 40 mg po daily x 1  day then Prednisone 30 mg po daily x 1 day then Prednisone 20 mg po daily x 1 day then Prednisone 10 mg daily x 1 day then stop... 03/24/16   Oswald Hillock, MD  rOPINIRole (REQUIP) 0.25 MG tablet Take 1 tablet (0.25 mg total) by mouth 3 (three) times daily. 03/24/16   Oswald Hillock, MD    Physical Exam: Vitals:   07/10/16 2200 07/11/16 0000 07/11/16 0030 07/11/16 0100  BP: (!) 143/61 136/62 125/60 133/65  Pulse: 88 72 66 64  Resp: (!) 46 (!) 23 (!) 29 (!) 23  Temp:      TempSrc:      SpO2: (!) 89% 94% 91% 94%  Weight:      Height:        GENERAL: 66 y.o.-year-old White female patient, well-developed, well-nourished lying in the bed in no acute distress.  Pleasant and cooperative.   HEENT: Head atraumatic, normocephalic. Pupils equal, round, reactive to light and accommodation. No scleral icterus. Extraocular muscles intact. Nares are patent. Oropharynx is clear. Mucus membranes moist. NECK: Supple, full range of  motion. No JVD, no bruit heard. No thyroid enlargement, no tenderness, no cervical lymphadenopathy. CHEST: Good air exchange with mild diffuse expiratory wheezes. No use of accessory muscles of respiration.  No reproducible chest wall tenderness.  CARDIOVASCULAR: S1, S2 normal. No murmurs, rubs, or gallops. Cap refill <2 seconds. Pulses intact distally.  ABDOMEN: Soft, nondistended, nontender, . No rebound, guarding, rigidity. Normoactive bowel sounds present in all four quadrants. No organomegaly or mass. EXTREMITIES: No pedal edema, cyanosis, or clubbing. NEUROLOGIC: Cranial nerves II through XII are grossly intact with no focal sensorimotor deficit. Muscle strength 5/5 in all extremities. Sensation intact. Gait not checked. PSYCHIATRIC: The patient is alert and oriented x 3. Normal affect, mood, thought content. SKIN: Warm, dry, and intact without obvious rash, lesion, or ulcer.   Labs on Admission: I have personally reviewed following labs and imaging studies  CBC:  Recent Labs Lab 07/10/16 2012  WBC 7.3  NEUTROABS 5.0  HGB 16.3*  HCT 48.0*  MCV 83.8  PLT 544   Basic Metabolic Panel:  Recent Labs Lab 07/10/16 2012  NA 134*  K 2.9*  CL 93*  CO2 30  GLUCOSE 348*  BUN 11  CREATININE 0.69  CALCIUM 9.2   GFR: Estimated Creatinine Clearance: 68.9 mL/min (by C-G formula based on SCr of 0.69 mg/dL). Liver Function Tests:  Recent Labs Lab 07/10/16 2012  AST 53*  ALT 33  ALKPHOS 113  BILITOT 1.1  PROT 6.7  ALBUMIN 3.2*   No results for input(s): LIPASE, AMYLASE in the last 168 hours. No results for input(s): AMMONIA in the last 168 hours. Coagulation Profile: No results for input(s): INR, PROTIME in the last 168 hours. Cardiac Enzymes:  Recent Labs Lab 07/10/16 2012  TROPONINI <0.03   BNP (last 3 results) No results for input(s): PROBNP in the last 8760 hours. HbA1C: No results for input(s): HGBA1C in the last 72 hours. CBG: No results for input(s):  GLUCAP in the last 168 hours. Lipid Profile: No results for input(s): CHOL, HDL, LDLCALC, TRIG, CHOLHDL, LDLDIRECT in the last 72 hours. Thyroid Function Tests: No results for input(s): TSH, T4TOTAL, FREET4, T3FREE, THYROIDAB in the last 72 hours. Anemia Panel: No results for input(s): VITAMINB12, FOLATE, FERRITIN, TIBC, IRON, RETICCTPCT in the last 72 hours. Urine analysis:    Component Value Date/Time   COLORURINE STRAW (A) 04/04/2016 1837   APPEARANCEUR CLEAR (A) 04/04/2016 1837  APPEARANCEUR Clear 11/14/2011 1607   LABSPEC 1.006 04/04/2016 1837   LABSPEC 1.006 11/14/2011 1607   PHURINE 7.0 04/04/2016 1837   GLUCOSEU NEGATIVE 04/04/2016 1837   GLUCOSEU Negative 11/14/2011 1607   HGBUR NEGATIVE 04/04/2016 1837   BILIRUBINUR NEGATIVE 04/04/2016 1837   BILIRUBINUR Negative 11/14/2011 1607   KETONESUR NEGATIVE 04/04/2016 1837   PROTEINUR NEGATIVE 04/04/2016 1837   NITRITE NEGATIVE 04/04/2016 1837   LEUKOCYTESUR NEGATIVE 04/04/2016 1837   LEUKOCYTESUR Negative 11/14/2011 1607   Sepsis Labs: _0 (procalcitonin:4,lacticidven:4) )No results found for this or any previous visit (from the past 240 hour(s)).   Radiological Exams on Admission: Dg Chest Portable 1 View  Result Date: 07/10/2016 CLINICAL DATA:  Shortness of breath. EXAM: PORTABLE CHEST 1 VIEW COMPARISON:  04/04/2016 FINDINGS: Cardiomediastinal silhouette is normal. Mediastinal contours appear intact. Calcific atherosclerotic disease and tortuosity of the aorta seen. There is no evidence of focal airspace consolidation, pleural effusion or pneumothorax. Osseous structures are without acute abnormality. Soft tissues are grossly normal. IMPRESSION: No active disease. Calcific atherosclerotic disease of the aorta. Electronically Signed   By: Fidela Salisbury M.D.   On: 07/10/2016 20:31   Assessment/Plan Active Problems:   COPD exacerbation ([place])    This is a 66 y.o. female with a history of Home O2 dependent COPD  now being admitted with: 1. Acute exacerbation of COPD - IV steroids and azithromycin -Continue Dulera, Singulair - Nebulizers, O2 therapy and expectorants as needed.  - Continuous pulse oximetry - Consider pulmonary consult if not improving.   2. Hypokalemia-replace by mouth and recheck BMP in a.m. 3. History of seizure disorder-continue Keppra 4. History of diabetes-hold glipizide, utilize regular insulin sliding scale coverage before meals and at bedtime 5. History of depression-continue Celexa 6. History of CAD-continue Plavix 7. History of hypertension-continue Norvasc, enalapril 8. History of GERD-continue Prilosec  Admission status: Inpatient IV Fluids: IV normal saline Diet/Nutrition: Heart healthy, carb controlled Consults called: None  DVT Px: Lovenox, SCDs and early ambulation Code Status: Full Code  Disposition Plan: To home in 1-2 days   All the records are reviewed and case discussed with ED provider. Management plans discussed with the patient and/or family who express understanding and agree with plan of care.  Brayla Pat D.O. on 07/11/2016 at 2:00 AM Between 7am to 6pm - Pager - [phone] After 6pm go to [email or web address] - Proofreader [facility] [place] [facility] Office [phone] CC: Primary care physician; Donnie Coffin, MD  Karen Bridge MD [facility] Pager [phone]   If 7PM-7AM, please contact night-coverage [email or web address] Password [email or web address]  07/11/2016, 2:00 AM

## 2016-07-11 NOTE — Plan of Care (Signed)
Problem: Education: Goal: Knowledge of [place] [facility] Education information/materials will improve Outcome: Progressing RN oriented pt to unit w/ interpreter, including unit routine.  VS WDL, free of falls during shift.  Denies pain, nausea.  Reported burning/shooting pain at [facility] PIV site, tracking up arm.  PIV flushed, removed.  Reported restless legs, pt ok w/ 1st dose gabapentin @ 1000.  Bed in low position, call bell within reach.  WCTM.

## 2016-07-11 NOTE — Progress Notes (Signed)
The Nurse asked [facility] to visit the Pt. [place] met Pt, talked with Pt, Pt requested prayer, which [place] offered. [place] also offered ministry of presence.   07/11/16 1600  Clinical Encounter Type  Visited With Patient  Visit Type Initial;Spiritual support  Referral From Nurse  Spiritual Encounters  Spiritual Needs Prayer;Other (Comment)

## 2016-07-11 NOTE — Progress Notes (Signed)
[place] at [place] NAME: [name]    MR#:  [id number]  DATE OF BIRTH:  [birth year]  SUBJECTIVE:  CHIEF COMPLAINT:  Pts sob is better, deaf  REVIEW OF SYSTEMS:  CONSTITUTIONAL: No fever, fatigue or weakness.  EYES: No blurred or double vision.  EARS, NOSE, AND THROAT: No tinnitus or ear pain.  RESPIRATORY: some cough, exertional shortness of breath, denies wheezing or hemoptysis.  CARDIOVASCULAR: No chest pain, orthopnea, edema.  GASTROINTESTINAL: No nausea, vomiting, diarrhea or abdominal pain.  GENITOURINARY: No dysuria, hematuria.  ENDOCRINE: No polyuria, nocturia,  HEMATOLOGY: No anemia, easy bruising or bleeding SKIN: No rash or lesion. MUSCULOSKELETAL: No joint pain or arthritis.   NEUROLOGIC: No tingling, numbness, weakness.  PSYCHIATRY: No anxiety or depression.   DRUG ALLERGIES:   Allergies  Allergen Reactions  . Aspirin   . Celebrex [Celecoxib]   . Ciprofloxacin   . Codeine   . Fosphenytoin   . Levaquin [Levofloxacin In D5w]   . Levofloxacin Other (See Comments)  . Lovastatin   . Penicillins   . Pravastatin   . Sulfa Antibiotics     VITALS:  Blood pressure (!) 146/52, pulse 72, temperature 97.8 F (36.6 C), temperature source Oral, resp. rate 16, height _0  (1.6 m), weight 74.8 kg (165 lb), SpO2 93 %.  PHYSICAL EXAMINATION:  GENERAL:  66 y.o.-year-old patient lying in the bed with no acute distress.  EYES: Pupils equal, round, reactive to light and accommodation. No scleral icterus. Extraocular muscles intact.  HEENT: Head atraumatic, normocephalic. Oropharynx and nasopharynx clear.  NECK:  Supple, no jugular venous distention. No thyroid enlargement, no tenderness.  LUNGS: Mod breath sounds bilaterally, no wheezing, rales,rhonchi or crepitation. No use of accessory muscles of respiration.  CARDIOVASCULAR: S1, S2 normal. No murmurs, rubs, or gallops.  ABDOMEN: Soft, nontender, nondistended. Bowel  sounds present. No organomegaly or mass.  EXTREMITIES: No pedal edema, cyanosis, or clubbing.  NEUROLOGIC: Cranial nerves II through XII are intact. Muscle strength 5/5 in all extremities. Sensation intact. Gait not checked.  PSYCHIATRIC: The patient is alert and oriented x 3.  SKIN: No obvious rash, lesion, or ulcer.    LABORATORY PANEL:   CBC  Recent Labs Lab 07/11/16 0618  WBC 6.0  HGB 16.0  HCT 47.5*  PLT 193   ------------------------------------------------------------------------------------------------------------------  Chemistries   Recent Labs Lab 07/10/16 2012 07/11/16 0618  NA 134* 132*  K 2.9* 3.7  CL 93* 96*  CO2 30 27  GLUCOSE 348* 398*  BUN 11 13  CREATININE 0.69 0.54  CALCIUM 9.2 9.0  AST 53*  --   ALT 33  --   ALKPHOS 113  --   BILITOT 1.1  --    ------------------------------------------------------------------------------------------------------------------  Cardiac Enzymes  Recent Labs Lab 07/10/16 2012  TROPONINI <0.03   ------------------------------------------------------------------------------------------------------------------  RADIOLOGY:  Dg Chest Portable 1 View  Result Date: 07/10/2016 CLINICAL DATA:  Shortness of breath. EXAM: PORTABLE CHEST 1 VIEW COMPARISON:  04/04/2016 FINDINGS: Cardiomediastinal silhouette is normal. Mediastinal contours appear intact. Calcific atherosclerotic disease and tortuosity of the aorta seen. There is no evidence of focal airspace consolidation, pleural effusion or pneumothorax. Osseous structures are without acute abnormality. Soft tissues are grossly normal. IMPRESSION: No active disease. Calcific atherosclerotic disease of the aorta. Electronically Signed   By: Fidela Salisbury M.D.   On: 07/10/2016 20:31    EKG:   Orders placed or performed during the hospital encounter of 04/04/16  . ED EKG  .  ED EKG  . EKG 12-Lead  . EKG 12-Lead  . EKG 12-Lead  . EKG 12-Lead  . EKG 12-Lead  . EKG  12-Lead  . EKG    ASSESSMENT AND PLAN:   This is a 66 y.o. female with a history of Home O2 dependent COPD now being admitted with:  1. Acute exacerbation of COPD - IV steroids and azithromycin will be continued -Continue Dulera, Singulair - Nebulizers, O2 therapy and expectorants as needed.  -- Consider pulmonary consult if not improving.   2. Hypokalemia-  resolved with potassium replacement potassium at 3.7 today  3. History of seizure disorder-continue Keppra  4. History of diabetes-hold glipizide, utilize regular insulin sliding scale coverage before meals and at bedtime  5. History of depression-continue Celexa  6. History of CAD- denies any chest paincontinue Plavix  7. History of hypertension- blood pressure is stable.continue Norvasc, enalapril  8. History of GERD-continue Prilosec    All the records are reviewed and case discussed with Care Management/Social Workerr. Management plans discussed with the patien with the help of sign language interpreter Ms. Santiago Glad , family and they are in agreement.  CODE STATUS: partial code, daughter is the healthcare power of attorney  TOTAL TIME TAKING CARE OF THIS PATIENT: 45 minutes.   POSSIBLE D/C IN 2 DAYS DAYS, DEPENDING ON CLINICAL CONDITION.  Note: This dictation was prepared with Dragon dictation along with smaller phrase technology. Any transcriptional errors that result from this process are unintentional.   Nicholes Mango M.D on 07/11/2016 at 5:58 PM  Between 7am to 6pm - Pager - [phone] After 6pm go to [email or web address] - password [email or web address] [place] [facility]  Office  [phone]  CC: Primary care physician; Donnie Coffin, MD

## 2016-07-11 NOTE — Progress Notes (Signed)
Inpatient Diabetes Program Recommendations  AACE/ADA: New Consensus Statement on Inpatient Glycemic Control (2015)  Target Ranges:  Prepandial:   less than 140 mg/dL      Peak postprandial:   less than 180 mg/dL (1-2 hours)      Critically ill patients:  140 - 180 mg/dL   Lab Results  Component Value Date   GLUCAP 340 (H) 07/11/2016   HGBA1C 7.3 (H) 03/23/2016    Review of Glycemic Control  Results for [name], [name] (MRN [id number]) as of 07/11/2016 10:37  Ref. Range 07/11/2016 07:49  Glucose-Capillary Latest Ref Range: 65 - 99 mg/dL 340 (H)    Diabetes history: Type 2 Outpatient Diabetes medications: Glipizide 86m/day Current orders for Inpatient glycemic control: Novolog 0-15 units tid, Novolog 0-5 units qhs,  *solumedrol q6h  Inpatient Diabetes Program Recommendations:  Consider adding 22 units Lantus insulin qhs (0.3units/kg) while patient is on steroids. Consider increasing Novolog correction to resistant correction scale tid.  JGentry Fitz RN, BA, MHA, CDE Diabetes Coordinator Inpatient Diabetes Program  [phone](Team Pager) [phone]([place] 07/11/2016 10:51 AM

## 2016-07-11 NOTE — ED Notes (Signed)
MD Hugelmeyer at bedside

## 2016-07-12 LAB — GLUCOSE, CAPILLARY
GLUCOSE-CAPILLARY: 156 mg/dL — AB (ref 65–99)
Glucose-Capillary: 278 mg/dL — ABNORMAL HIGH (ref 65–99)
Glucose-Capillary: 246 mg/dL — ABNORMAL HIGH (ref 65–99)
Glucose-Capillary: 236 mg/dL — ABNORMAL HIGH (ref 65–99)

## 2016-07-12 LAB — BASIC METABOLIC PANEL
Anion gap: 7 (ref 5–15)
BUN: 19 mg/dL (ref 6–20)
CO2: 25 mmol/L (ref 22–32)
CREATININE: 0.69 mg/dL (ref 0.44–1.00)
Calcium: 8.8 mg/dL — ABNORMAL LOW (ref 8.9–10.3)
Chloride: 102 mmol/L (ref 101–111)
GFR calc Af Amer: >60 mL/min (ref >60)
GFR calc non Af Amer: >60 mL/min (ref >60)
GLUCOSE: 319 mg/dL — AB (ref 65–99)
Potassium: 4.8 mmol/L (ref 3.5–5.1)
Sodium: 134 mmol/L — ABNORMAL LOW (ref 135–145)

## 2016-07-12 MED ORDER — LORAZEPAM 2 MG/ML IJ SOLN
2.0000 mg | INTRAMUSCULAR | Status: DC | PRN
  Administered 2016-07-13 (×2): 2 mg via INTRAVENOUS
  Filled 2016-07-12 (×2): qty 1

## 2016-07-12 MED ORDER — LEVETIRACETAM 500 MG PO TABS
500.0000 mg | ORAL_TABLET | Freq: Two times a day (BID) | ORAL | Status: DC
  Administered 2016-07-12 – 2016-07-13 (×3): 500 mg via ORAL
  Filled 2016-07-12 (×2): qty 1

## 2016-07-12 MED ORDER — IPRATROPIUM-ALBUTEROL 0.5-2.5 (3) MG/3ML IN SOLN
3.0000 mL | RESPIRATORY_TRACT | Status: DC
  Administered 2016-07-12 – 2016-07-17 (×28): 3 mL via RESPIRATORY_TRACT
  Filled 2016-07-12 (×30): qty 3

## 2016-07-12 NOTE — Progress Notes (Signed)
Writer was called to patients room by nurse tech because she was unresponsive but mumbling. Pt seemed to have seizure like activity for unknown amount of time. Seizure like activity witnessed for about 2 minutes. When patient stopped having seizure like activity she stated "I can't breathe". 02 stats were 88$% on 4L 02. Breathing treatment given, pt states she feels better, 02 stats 92 % on 4L. MD notified. Orders for Ativan PRN and seizure precautions. Ammie Dalton, RN

## 2016-07-12 NOTE — Consult Note (Signed)
Reason for Consult:seizure activity Referring Physician: Dr. Anselm Jungling  CC: seizure activity   HPI: [name] is an 66 y.o. female  with a known history of COPD, home O2 dependent presents to the emergency department for evaluation of SOB suspected COPD exacerbation.   Neurology consulted for seizure activity.  Apparently pt does have history of seizures and last one was few months ago prior to today.  Today pt's O2 sats decreased and she had suspected seizure activity.  She stopped her anti epileptic medications about 2 months ago due to history of headaches.  At one ponit pt was on Keppra 1000 bid.    Past Medical History:  Diagnosis Date  . Asthma   . Cirrhosis, non-alcoholic ([place])   . COPD (chronic obstructive pulmonary disease) ([place])   . Deaf   . Depression   . Diabetes mellitus without complication ([place])   . GERD (gastroesophageal reflux disease)   . Heart murmur   . Hepatitis   . Hypertension   . Lymph node disorder    arm  . Neuropathy ([place])   . On home oxygen therapy    hs  . Orthopnea   . RLS (restless legs syndrome)   . Seizures ([place])   . Shortness of breath dyspnea   . Sleep apnea   . Stroke [facility])    tia    Past Surgical History:  Procedure Laterality Date  . CATARACT EXTRACTION W/PHACO Right 11/23/2014   Procedure: CATARACT EXTRACTION PHACO AND INTRAOCULAR LENS PLACEMENT (IOC);  Surgeon: Lyla Glassing, MD;  Location: [facility] ORS;  Service: Ophthalmology;  Laterality: Right;  . CATARACT EXTRACTION W/PHACO Left 12/14/2014   Procedure: CATARACT EXTRACTION PHACO AND INTRAOCULAR LENS PLACEMENT (IOC);  Surgeon: Lyla Glassing, MD;  Location: [facility] ORS;  Service: Ophthalmology;  Laterality: Left;  US:01:16.6 AP:15.8 CDE:12.14  . CESAREAN SECTION    . CHOLECYSTECTOMY    . KNEE ARTHROPLASTY    . THUMB ARTHROSCOPY    . TONSILLECTOMY    . TYMPANOPLASTY     muliple    Family History  Problem Relation Age of Onset  . Lung cancer Mother   . CAD Father      Social History:  reports that she has been smoking.  She has been smoking about 0.50 packs per day. She has never used smokeless tobacco. She reports that she does not drink alcohol or use drugs.  Allergies  Allergen Reactions  . Aspirin   . Celebrex [Celecoxib]   . Ciprofloxacin   . Codeine   . Fosphenytoin   . Levaquin [Levofloxacin In D5w]   . Levofloxacin Other (See Comments)  . Lovastatin   . Penicillins   . Pravastatin   . Sulfa Antibiotics     Medications: I have reviewed the patient's current medications.  ROS: History obtained from the patient  General ROS: negative for - chills, fatigue, fever, night sweats, weight gain or weight loss Psychological ROS: negative for - behavioral disorder, hallucinations, memory difficulties, mood swings or suicidal ideation Ophthalmic ROS: negative for - blurry vision, double vision, eye pain or loss of vision ENT ROS: negative for - epistaxis, nasal discharge, oral lesions, sore throat, tinnitus or vertigo Allergy and Immunology ROS: negative for - hives or itchy/watery eyes Hematological and Lymphatic ROS: negative for - bleeding problems, bruising or swollen lymph nodes Endocrine ROS: negative for - galactorrhea, hair pattern changes, polydipsia/polyuria or temperature intolerance Respiratory ROS: negative for - cough, hemoptysis, shortness of breath or wheezing Cardiovascular ROS: negative for -  chest pain, dyspnea on exertion, edema or irregular heartbeat Gastrointestinal ROS: negative for - abdominal pain, diarrhea, hematemesis, nausea/vomiting or stool incontinence Genito-Urinary ROS: negative for - dysuria, hematuria, incontinence or urinary frequency/urgency Musculoskeletal ROS: negative for - joint swelling or muscular weakness Neurological ROS: as noted in HPI Dermatological ROS: negative for rash and skin lesion changes  Physical Examination: Blood pressure (!) 120/55, pulse 76, temperature 98.1 F (36.7 C),  temperature source Oral, resp. rate 20, height _0  (1.6 m), weight 74.8 kg (165 lb), SpO2 94 %.    Neurological Examination Mental Status: Alert, oriented, thought content appropriate.  Speech fluent without evidence of aphasia.  Able to follow 3 step commands without difficulty. Cranial Nerves: II: Discs flat bilaterally; Visual fields grossly normal, pupils equal, round, reactive to light and accommodation III,IV, VI: ptosis not present, extra-ocular motions intact bilaterally V,VII: smile symmetric, facial light touch sensation normal bilaterally VIII: hearing normal bilaterally IX,X: gag reflex present XI: bilateral shoulder shrug XII: midline tongue extension Motor: Right : Upper extremity   5/5    Left:     Upper extremity   5/5  Lower extremity   5/5     Lower extremity   5/5 Tone and bulk:normal tone throughout; no atrophy noted Sensory: Pinprick and light touch intact throughout, bilaterally Deep Tendon Reflexes: 2+ and symmetric throughout Plantars: Right: downgoing   Left: downgoing Cerebellar: normal finger-to-nose, normal rapid alternating movements and normal heel-to-shin test Gait: normal gait and station      Laboratory Studies:   Basic Metabolic Panel:  Recent Labs Lab 07/10/16 2012 07/11/16 0618 07/12/16 0526  NA 134* 132* 134*  K 2.9* 3.7 4.8  CL 93* 96* 102  CO2 _1 GLUCOSE 348* 398* 319*  BUN _2 CREATININE 0.69 0.54 0.69  CALCIUM 9.2 9.0 8.8*    Liver Function Tests:  Recent Labs Lab 07/10/16 2012  AST 53*  ALT 33  ALKPHOS 113  BILITOT 1.1  PROT 6.7  ALBUMIN 3.2*   No results for input(s): LIPASE, AMYLASE in the last 168 hours. No results for input(s): AMMONIA in the last 168 hours.  CBC:  Recent Labs Lab 07/10/16 2012 07/11/16 0618  WBC 7.3 6.0  NEUTROABS 5.0  --   HGB 16.3* 16.0  HCT 48.0* 47.5*  MCV 83.8 84.2  PLT 212 193    Cardiac Enzymes:  Recent Labs Lab 07/10/16 2012  TROPONINI <0.03     BNP: Invalid input(s): POCBNP  CBG:  Recent Labs Lab 07/11/16 0749 07/11/16 1139 07/11/16 1632 07/11/16 2100 07/12/16 0749  GLUCAP 340* 270* 240* 200* 278*    Microbiology: Results for orders placed or performed during the hospital encounter of 07/10/16  Blood culture (routine x 2)     Status: None (Preliminary result)   Collection Time: 07/10/16  8:12 PM  Result Value Ref Range Status   Specimen Description BLOOD RIGHT AC  Final   Special Requests   Final    BOTTLES DRAWN AEROBIC AND ANAEROBIC AER 7ML ANA 4ML   Culture NO GROWTH 2 DAYS  Final   Report Status PENDING  Incomplete  Blood culture (routine x 2)     Status: None (Preliminary result)   Collection Time: 07/10/16  8:12 PM  Result Value Ref Range Status   Specimen Description BLOOD RIGHT FA  Final   Special Requests   Final    BOTTLES DRAWN AEROBIC AND ANAEROBIC AER 9ML ANA 12ML   Culture NO GROWTH 2  DAYS  Final   Report Status PENDING  Incomplete  MRSA PCR Screening     Status: Abnormal   Collection Time: 07/11/16  7:37 AM  Result Value Ref Range Status   MRSA by PCR POSITIVE (A) NEGATIVE Final    Comment:        The GeneXpert MRSA Assay (FDA approved for NASAL specimens only), is one component of a comprehensive MRSA colonization surveillance program. It is not intended to diagnose MRSA infection nor to guide or monitor treatment for MRSA infections. RESULT CALLED TO, READ BACK BY AND VERIFIED WITH: DANA JUGGINS ON 07/11/16 AT 0921 MNS     Coagulation Studies: No results for input(s): LABPROT, INR in the last 72 hours.  Urinalysis: No results for input(s): COLORURINE, LABSPEC, PHURINE, GLUCOSEU, HGBUR, BILIRUBINUR, KETONESUR, PROTEINUR, UROBILINOGEN, NITRITE, LEUKOCYTESUR in the last 168 hours.  Invalid input(s): APPERANCEUR  Lipid Panel:  No results found for: CHOL, TRIG, HDL, CHOLHDL, VLDL, LDLCALC  HgbA1C:  Lab Results  Component Value Date   HGBA1C 7.3 (H) 03/23/2016    Urine Drug  Screen:  No results found for: LABOPIA, COCAINSCRNUR, LABBENZ, AMPHETMU, THCU, LABBARB  Alcohol Level: No results for input(s): ETH in the last 168 hours.  Other results: EKG: normal EKG, normal sinus rhythm, unchanged from previous tracings.  Imaging: Dg Chest Portable 1 View  Result Date: 07/10/2016 CLINICAL DATA:  Shortness of breath. EXAM: PORTABLE CHEST 1 VIEW COMPARISON:  04/04/2016 FINDINGS: Cardiomediastinal silhouette is normal. Mediastinal contours appear intact. Calcific atherosclerotic disease and tortuosity of the aorta seen. There is no evidence of focal airspace consolidation, pleural effusion or pneumothorax. Osseous structures are without acute abnormality. Soft tissues are grossly normal. IMPRESSION: No active disease. Calcific atherosclerotic disease of the aorta. Electronically Signed   By: Fidela Salisbury M.D.   On: 07/10/2016 20:31     Assessment/Plan:  66 y.o. female  with a known history of COPD, home O2 dependent presents to the emergency department for evaluation of SOB suspected COPD exacerbation.   Neurology consulted for seizure activity.  Apparently pt does have history of seizures and last one was few months ago prior to today.  Today pt's O2 sats decreased and she had suspected seizure activity.  She stopped her anti epileptic medications about 2 months ago due to history of headaches.  At one ponit pt was on Keppra 1000 bid.    - pt is back to baseline - Will con't Keppra but decreased dose to 500 BID. Hypoxia possibly predisposed to seizure activity - Don't think EEG at this point given the history or further imaging - Call with questions.    Leotis Pain  07/12/2016, 11:23 AM

## 2016-07-12 NOTE — Progress Notes (Signed)
[facility] [place] NAME: [name]    MR#:  [id number]  DATE OF BIRTH:  [birth year]  SUBJECTIVE:  CHIEF COMPLAINT:  Pts sob is better, still have cough and some secretions.She had seizures today morning.   She was on some seizure meds, stopped taking as she had headaches with that 2 weeks ago. Pt does not remember about today's seizure episode.  REVIEW OF SYSTEMS:  CONSTITUTIONAL: No fever, fatigue or weakness.  EYES: No blurred or double vision.  EARS, NOSE, AND THROAT: No tinnitus or ear pain.  RESPIRATORY: some cough, exertional shortness of breath, denies wheezing or hemoptysis.  CARDIOVASCULAR: No chest pain, orthopnea, edema.  GASTROINTESTINAL: No nausea, vomiting, diarrhea or abdominal pain.  GENITOURINARY: No dysuria, hematuria.  ENDOCRINE: No polyuria, nocturia,  HEMATOLOGY: No anemia, easy bruising or bleeding SKIN: No rash or lesion. MUSCULOSKELETAL: No joint pain or arthritis.   NEUROLOGIC: No tingling, numbness, weakness.  PSYCHIATRY: No anxiety or depression.   DRUG ALLERGIES:   Allergies  Allergen Reactions  . Aspirin   . Celebrex [Celecoxib]   . Ciprofloxacin   . Codeine   . Fosphenytoin   . Levaquin [Levofloxacin In D5w]   . Levofloxacin Other (See Comments)  . Lovastatin   . Penicillins   . Pravastatin   . Sulfa Antibiotics     VITALS:  Blood pressure (!) 119/51, pulse 73, temperature 98.1 F (36.7 C), temperature source Oral, resp. rate 20, height _0  (1.6 m), weight 74.8 kg (165 lb), SpO2 92 %.  PHYSICAL EXAMINATION:  GENERAL:  66 y.o.-year-old patient lying in the bed with no acute distress.  EYES: Pupils equal, round, reactive to light and accommodation. No scleral icterus. Extraocular muscles intact.  HEENT: Head atraumatic, normocephalic. Oropharynx and nasopharynx clear.  NECK:  Supple, no jugular venous distention. No thyroid enlargement, no tenderness.  LUNGS: Mod breath sounds  bilaterally, b/l wheezing, no crepitation. No use of accessory muscles of respiration. Started coughing sverely after having a deep breath. CARDIOVASCULAR: S1, S2 normal. No murmurs, rubs, or gallops.  ABDOMEN: Soft, nontender, nondistended. Bowel sounds present. No organomegaly or mass.  EXTREMITIES: No pedal edema, cyanosis, or clubbing.  NEUROLOGIC: Cranial nerves II through XII are intact. Muscle strength 5/5 in all extremities. Sensation intact. Gait not checked.  PSYCHIATRIC: The patient is alert and oriented x 3.  SKIN: No obvious rash, lesion, or ulcer.    LABORATORY PANEL:   CBC  Recent Labs Lab 07/11/16 0618  WBC 6.0  HGB 16.0  HCT 47.5*  PLT 193   ------------------------------------------------------------------------------------------------------------------  Chemistries   Recent Labs Lab 07/10/16 2012  07/12/16 0526  NA 134*  < > 134*  K 2.9*  < > 4.8  CL 93*  < > 102  CO2 30  < > 25  GLUCOSE 348*  < > 319*  BUN 11  < > 19  CREATININE 0.69  < > 0.69  CALCIUM 9.2  < > 8.8*  AST 53*  --   --   ALT 33  --   --   ALKPHOS 113  --   --   BILITOT 1.1  --   --   < > = values in this interval not displayed. ------------------------------------------------------------------------------------------------------------------  Cardiac Enzymes  Recent Labs Lab 07/10/16 2012  TROPONINI <0.03   ------------------------------------------------------------------------------------------------------------------  RADIOLOGY:  Dg Chest Portable 1 View  Result Date: 07/10/2016 CLINICAL DATA:  Shortness of breath. EXAM: PORTABLE CHEST 1 VIEW COMPARISON:  04/04/2016 FINDINGS: Cardiomediastinal silhouette is normal. Mediastinal contours appear intact. Calcific atherosclerotic disease and tortuosity of the aorta seen. There is no evidence of focal airspace consolidation, pleural effusion or pneumothorax. Osseous structures are without acute abnormality. Soft tissues are grossly  normal. IMPRESSION: No active disease. Calcific atherosclerotic disease of the aorta. Electronically Signed   By: Fidela Salisbury M.D.   On: 07/10/2016 20:31    EKG:   Orders placed or performed during the hospital encounter of 04/04/16  . ED EKG  . ED EKG  . EKG 12-Lead  . EKG 12-Lead  . EKG 12-Lead  . EKG 12-Lead  . EKG 12-Lead  . EKG 12-Lead  . EKG    ASSESSMENT AND PLAN:   This is a 66 y.o. female with a history of Home O2 dependent COPD now being admitted with:  1. Acute exacerbation of COPD - IV steroids and azithromycin will be continued -Continue Dulera, Singulair - Nebulizers, O2 therapy and expectorants as needed.   2. Hypokalemia-  resolved with potassium replacement potassium at 3.7 today  3. History of seizure disorder- had seizures here 07/12/16 morning.   She stopped taking seizure meds 2 weeks ago,    Neurology consulted to re-adjust dose of seizure meds.  4. History of diabetes-hold glipizide, utilize regular insulin sliding scale coverage before meals and at bedtime  5. History of depression-continue Celexa  6. History of CAD- denies any chest paincontinue Plavix  7. History of hypertension- blood pressure is stable.continue Norvasc, enalapril  8. History of GERD-continue Prilosec    All the records are reviewed and case discussed with Care Management/Social Workerr. Management plans discussed with the patien with the help of sign language interpreter Ms. Santiago Glad , family and they are in agreement.  CODE STATUS: partial code, daughter is the healthcare power of attorney  TOTAL TIME TAKING CARE OF THIS PATIENT: 40 minutes.  Interviewed pt with help of sign language interpreter.  POSSIBLE D/C IN 2 DAYS DAYS, DEPENDING ON CLINICAL CONDITION.  Note: This dictation was prepared with Dragon dictation along with smaller phrase technology. Any transcriptional errors that result from this process are unintentional.   Vaughan Basta M.D on  07/12/2016 at 9:51 AM  Between 7am to 6pm - Pager - [phone] After 6pm go to [email or web address] - password [email or web address] [facility]  Office  [phone]  CC: Primary care physician; Donnie Coffin, MD

## 2016-07-13 ENCOUNTER — Inpatient Hospital Stay
Admit: 2016-07-13 | Discharge: 2016-07-13 | Disposition: A | Discharge status: Home / Self Care | Payer: BLUE CROSS/BLUE SHIELD | Attending: Internal Medicine | Admitting: Internal Medicine

## 2016-07-13 ENCOUNTER — Inpatient Hospital Stay: Payer: BLUE CROSS/BLUE SHIELD

## 2016-07-13 LAB — HEMOGLOBIN A1C
HEMOGLOBIN A1C: 7.9 % — AB (ref 4.8–5.6)
MEAN PLASMA GLUCOSE: 180 mg/dL

## 2016-07-13 LAB — GLUCOSE, CAPILLARY
GLUCOSE-CAPILLARY: 289 mg/dL — AB (ref 65–99)
Glucose-Capillary: 285 mg/dL — ABNORMAL HIGH (ref 65–99)
Glucose-Capillary: 85 mg/dL (ref 65–99)
Glucose-Capillary: 201 mg/dL — ABNORMAL HIGH (ref 65–99)

## 2016-07-13 MED ORDER — FUROSEMIDE 10 MG/ML IJ SOLN
40.0000 mg | Freq: Once | INTRAMUSCULAR | Status: AC
  Administered 2016-07-13: 40 mg via INTRAVENOUS
  Filled 2016-07-13: qty 4

## 2016-07-13 MED ORDER — FUROSEMIDE 10 MG/ML IJ SOLN
20.0000 mg | Freq: Two times a day (BID) | INTRAMUSCULAR | Status: DC
  Administered 2016-07-13 – 2016-07-14 (×2): 20 mg via INTRAVENOUS
  Filled 2016-07-13 (×2): qty 2

## 2016-07-13 MED ORDER — LEVETIRACETAM 500 MG PO TABS
1000.0000 mg | ORAL_TABLET | Freq: Once | ORAL | Status: AC
  Administered 2016-07-13: 1000 mg via ORAL
  Filled 2016-07-13: qty 2

## 2016-07-13 MED ORDER — LEVETIRACETAM 500 MG PO TABS
1000.0000 mg | ORAL_TABLET | Freq: Two times a day (BID) | ORAL | Status: DC
  Administered 2016-07-13 – 2016-07-14 (×2): 1000 mg via ORAL
  Filled 2016-07-13 (×2): qty 2

## 2016-07-13 NOTE — Consult Note (Signed)
Reason for Consult:seizure activity Referring Physician: Dr. Anselm Jungling  CC: seizure activity   HPI: [name] is an 66 y.o. female  with a known history of COPD, home O2 dependent presents to the emergency department for evaluation of SOB suspected COPD exacerbation.   Neurology consulted for seizure activity.  Apparently pt does have history of seizures and last one was few months ago prior to today.  Today pt's O2 sats decreased and she had suspected seizure activity.  She stopped her anti epileptic medications about 2 months ago due to history of headaches.  At one ponit pt was on Keppra 1000 bid.    Pt had another seizure yesterday that was associated with hypoxia.    Past Medical History:  Diagnosis Date  . Asthma   . Cirrhosis, non-alcoholic ([place])   . COPD (chronic obstructive pulmonary disease) ([place])   . Deaf   . Depression   . Diabetes mellitus without complication ([place])   . GERD (gastroesophageal reflux disease)   . Heart murmur   . Hepatitis   . Hypertension   . Lymph node disorder    arm  . Neuropathy ([place])   . On home oxygen therapy    hs  . Orthopnea   . RLS (restless legs syndrome)   . Seizures ([place])   . Shortness of breath dyspnea   . Sleep apnea   . Stroke [facility])    tia    Past Surgical History:  Procedure Laterality Date  . CATARACT EXTRACTION W/PHACO Right 11/23/2014   Procedure: CATARACT EXTRACTION PHACO AND INTRAOCULAR LENS PLACEMENT (IOC);  Surgeon: Lyla Glassing, MD;  Location: [facility] ORS;  Service: Ophthalmology;  Laterality: Right;  . CATARACT EXTRACTION W/PHACO Left 12/14/2014   Procedure: CATARACT EXTRACTION PHACO AND INTRAOCULAR LENS PLACEMENT (IOC);  Surgeon: Lyla Glassing, MD;  Location: [facility] ORS;  Service: Ophthalmology;  Laterality: Left;  US:01:16.6 AP:15.8 CDE:12.14  . CESAREAN SECTION    . CHOLECYSTECTOMY    . KNEE ARTHROPLASTY    . THUMB ARTHROSCOPY    . TONSILLECTOMY    . TYMPANOPLASTY     muliple    Family History  Problem  Relation Age of Onset  . Lung cancer Mother   . CAD Father     Social History:  reports that she has been smoking.  She has been smoking about 0.50 packs per day. She has never used smokeless tobacco. She reports that she does not drink alcohol or use drugs.  Allergies  Allergen Reactions  . Aspirin   . Celebrex [Celecoxib]   . Ciprofloxacin   . Codeine   . Fosphenytoin   . Levaquin [Levofloxacin In D5w]   . Levofloxacin Other (See Comments)  . Lovastatin   . Penicillins   . Pravastatin   . Sulfa Antibiotics     Medications: I have reviewed the patient's current medications.  ROS: History obtained from the patient  General ROS: negative for - chills, fatigue, fever, night sweats, weight gain or weight loss Psychological ROS: negative for - behavioral disorder, hallucinations, memory difficulties, mood swings or suicidal ideation Ophthalmic ROS: negative for - blurry vision, double vision, eye pain or loss of vision ENT ROS: negative for - epistaxis, nasal discharge, oral lesions, sore throat, tinnitus or vertigo Allergy and Immunology ROS: negative for - hives or itchy/watery eyes Hematological and Lymphatic ROS: negative for - bleeding problems, bruising or swollen lymph nodes Endocrine ROS: negative for - galactorrhea, hair pattern changes, polydipsia/polyuria or temperature intolerance Respiratory ROS: negative for -  cough, hemoptysis, shortness of breath or wheezing Cardiovascular ROS: negative for - chest pain, dyspnea on exertion, edema or irregular heartbeat Gastrointestinal ROS: negative for - abdominal pain, diarrhea, hematemesis, nausea/vomiting or stool incontinence Genito-Urinary ROS: negative for - dysuria, hematuria, incontinence or urinary frequency/urgency Musculoskeletal ROS: negative for - joint swelling or muscular weakness Neurological ROS: as noted in HPI Dermatological ROS: negative for rash and skin lesion changes  Physical Examination: Blood pressure  136/68, pulse 85, temperature 97.9 F (36.6 C), temperature source Oral, resp. rate (!) 22, height _0  (1.6 m), weight 74.8 kg (165 lb), SpO2 91 %.    Neurological Examination Mental Status: Alert, oriented, thought content appropriate.  Speech fluent without evidence of aphasia.  Able to follow 3 step commands without difficulty. Cranial Nerves: II: Discs flat bilaterally; Visual fields grossly normal, pupils equal, round, reactive to light and accommodation III,IV, VI: ptosis not present, extra-ocular motions intact bilaterally V,VII: smile symmetric, facial light touch sensation normal bilaterally VIII: hearing normal bilaterally IX,X: gag reflex present XI: bilateral shoulder shrug XII: midline tongue extension Motor: Right : Upper extremity   5/5    Left:     Upper extremity   5/5  Lower extremity   5/5     Lower extremity   5/5 Tone and bulk:normal tone throughout; no atrophy noted Sensory: Pinprick and light touch intact throughout, bilaterally Deep Tendon Reflexes: 2+ and symmetric throughout Plantars: Right: downgoing   Left: downgoing Cerebellar: normal finger-to-nose, normal rapid alternating movements and normal heel-to-shin test Gait: normal gait and station      Laboratory Studies:   Basic Metabolic Panel:  Recent Labs Lab 07/10/16 2012 07/11/16 0618 07/12/16 0526  NA 134* 132* 134*  K 2.9* 3.7 4.8  CL 93* 96* 102  CO2 _1 GLUCOSE 348* 398* 319*  BUN _2 CREATININE 0.69 0.54 0.69  CALCIUM 9.2 9.0 8.8*    Liver Function Tests:  Recent Labs Lab 07/10/16 2012  AST 53*  ALT 33  ALKPHOS 113  BILITOT 1.1  PROT 6.7  ALBUMIN 3.2*   No results for input(s): LIPASE, AMYLASE in the last 168 hours. No results for input(s): AMMONIA in the last 168 hours.  CBC:  Recent Labs Lab 07/10/16 2012 07/11/16 0618  WBC 7.3 6.0  NEUTROABS 5.0  --   HGB 16.3* 16.0  HCT 48.0* 47.5*  MCV 83.8 84.2  PLT 212 193    Cardiac  Enzymes:  Recent Labs Lab 07/10/16 2012  TROPONINI <0.03    BNP: Invalid input(s): POCBNP  CBG:  Recent Labs Lab 07/12/16 0749 07/12/16 1204 07/12/16 1635 07/12/16 2103 07/13/16 0731  GLUCAP 278* 246* 156* 236* 63*    Microbiology: Results for orders placed or performed during the hospital encounter of 07/10/16  Blood culture (routine x 2)     Status: None (Preliminary result)   Collection Time: 07/10/16  8:12 PM  Result Value Ref Range Status   Specimen Description BLOOD RIGHT AC  Final   Special Requests   Final    BOTTLES DRAWN AEROBIC AND ANAEROBIC AER 7ML ANA 4ML   Culture NO GROWTH 3 DAYS  Final   Report Status PENDING  Incomplete  Blood culture (routine x 2)     Status: None (Preliminary result)   Collection Time: 07/10/16  8:12 PM  Result Value Ref Range Status   Specimen Description BLOOD RIGHT FA  Final   Special Requests   Final    BOTTLES DRAWN AEROBIC  AND ANAEROBIC AER 9ML ANA 12ML   Culture NO GROWTH 3 DAYS  Final   Report Status PENDING  Incomplete  MRSA PCR Screening     Status: Abnormal   Collection Time: 07/11/16  7:37 AM  Result Value Ref Range Status   MRSA by PCR POSITIVE (A) NEGATIVE Final    Comment:        The GeneXpert MRSA Assay (FDA approved for NASAL specimens only), is one component of a comprehensive MRSA colonization surveillance program. It is not intended to diagnose MRSA infection nor to guide or monitor treatment for MRSA infections. RESULT CALLED TO, READ BACK BY AND VERIFIED WITH: DANA JUGGINS ON 07/11/16 AT 0921 MNS     Coagulation Studies: No results for input(s): LABPROT, INR in the last 72 hours.  Urinalysis: No results for input(s): COLORURINE, LABSPEC, PHURINE, GLUCOSEU, HGBUR, BILIRUBINUR, KETONESUR, PROTEINUR, UROBILINOGEN, NITRITE, LEUKOCYTESUR in the last 168 hours.  Invalid input(s): APPERANCEUR  Lipid Panel:  No results found for: CHOL, TRIG, HDL, CHOLHDL, VLDL, LDLCALC  HgbA1C:  Lab Results   Component Value Date   HGBA1C 7.3 (H) 03/23/2016    Urine Drug Screen:  No results found for: LABOPIA, COCAINSCRNUR, LABBENZ, AMPHETMU, THCU, LABBARB  Alcohol Level: No results for input(s): ETH in the last 168 hours.  Other results: EKG: normal EKG, normal sinus rhythm, unchanged from previous tracings.  Imaging: Dg Chest 2 View  Result Date: 07/13/2016 CLINICAL DATA:  Hypoxia, asthma, cirrhosis, COPD EXAM: CHEST  2 VIEW COMPARISON:  07/10/2016 FINDINGS: Mild cardiac enlargement with increased vascular and interstitial prominence throughout both lungs suspicious for volume overload versus early edema. No significant effusion or pneumothorax. No focal pneumonia, collapse or consolidation. Aorta is atherosclerotic. Trachea remains midline. Degenerative changes noted of the spine. IMPRESSION: Cardiomegaly with volume overload versus early edema pattern. Thoracic aortic atherosclerosis. Electronically Signed   By: Jerilynn Mages.  Shick M.D.   On: 07/13/2016 09:38     Assessment/Plan:  66 y.o. female  with a known history of COPD, home O2 dependent presents to the emergency department for evaluation of SOB suspected COPD exacerbation.   Neurology consulted for seizure activity.  Apparently pt does have history of seizures and last one was few months ago prior to today.  Today pt's O2 sats decreased and she had suspected seizure activity.  She stopped her anti epileptic medications about 2 months ago due to history of headaches.  At one ponit pt was on Keppra 1000 bid.    - pt is back to baseline but had another seizure episode last evening - these episodes were all associated with hypoxia that could be predisposing to these events where are suspected seizures but not clear.  Pt does have history of seizures though - Will increase to baseline Keppra of 1gm q12 - If still in hospital tomorrow will obtain routine EEG.     Leotis Pain  07/13/2016, 11:15 AM

## 2016-07-13 NOTE — Progress Notes (Deleted)
Discharge instructions along with home medications and follow up gone over with patient and son. Both verbalize that they understood instructions. No prescriptions given to patient. IV removed. Pt being discharged home on room air, no distress noted. Ammie Dalton, RN

## 2016-07-13 NOTE — Progress Notes (Signed)
Dr. Estanislado Pandy paged.  Pt having hard time breathing.  O2 91% on 4L.  Lungs sounds rhonchi and tight.  Order for Lasix obtained.

## 2016-07-13 NOTE — Progress Notes (Signed)
Pt had 5 minute seizure like activity. Pt given 24m Ativan. MD paged, Neurologist paged, orders for Keppra once now received. MAmmie Dalton RN

## 2016-07-13 NOTE — Progress Notes (Signed)
[place] at [place] NAME: [name]    MR#:  [id number]  DATE OF BIRTH:  Oct 13, 1950  SUBJECTIVE:  CHIEF COMPLAINT:  Pts sob is better, still have cough and some secretions.She had seizures 07/12/16 morning and again on 07/13/16 morning.   She was on some seizure meds, stopped taking as she had headaches with that 2 weeks ago. Today when I saw her- she was sleepy after receiving ativan for seizures.  her daughter was in room. Pt have increased oxygen requirement.  REVIEW OF SYSTEMS:   Pt is sleepy and not able to give ROS.  DRUG ALLERGIES:   Allergies  Allergen Reactions  . Aspirin   . Celebrex [Celecoxib]   . Ciprofloxacin   . Codeine   . Fosphenytoin   . Levaquin [Levofloxacin In D5w]   . Levofloxacin Other (See Comments)  . Lovastatin   . Penicillins   . Pravastatin   . Sulfa Antibiotics     VITALS:  Blood pressure 128/64, pulse 79, temperature 97.9 F (36.6 C), temperature source Oral, resp. rate 14, height _0  (1.6 m), weight 74.8 kg (165 lb), SpO2 95 %.  PHYSICAL EXAMINATION:  GENERAL:  66 y.o.-year-old patient lying in the bed with no acute distress.  EYES: Pupils equal, round, reactive to light and accommodation. No scleral icterus. Extraocular muscles intact.  HEENT: Head atraumatic, normocephalic. Oropharynx and nasopharynx clear.  NECK:  Supple, no jugular venous distention. No thyroid enlargement, no tenderness.  LUNGS: Mod breath sounds bilaterally, b/l wheezing, no crepitation. No use of accessory muscles of respiration. Started coughing sverely after having a deep breath. CARDIOVASCULAR: S1, S2 normal. No murmurs, rubs, or gallops.  ABDOMEN: Soft, nontender, nondistended. Bowel sounds present. No organomegaly or mass.  EXTREMITIES: No pedal edema, cyanosis, or clubbing.  NEUROLOGIC: pt is sleepy after ativan injection, and not much responsive. PSYCHIATRIC: The patient is sleepy today.  SKIN: No obvious  rash, lesion, or ulcer.    LABORATORY PANEL:   CBC  Recent Labs Lab 07/11/16 0618  WBC 6.0  HGB 16.0  HCT 47.5*  PLT 193   ------------------------------------------------------------------------------------------------------------------  Chemistries   Recent Labs Lab 07/10/16 2012  07/12/16 0526  NA 134*  < > 134*  K 2.9*  < > 4.8  CL 93*  < > 102  CO2 30  < > 25  GLUCOSE 348*  < > 319*  BUN 11  < > 19  CREATININE 0.69  < > 0.69  CALCIUM 9.2  < > 8.8*  AST 53*  --   --   ALT 33  --   --   ALKPHOS 113  --   --   BILITOT 1.1  --   --   < > = values in this interval not displayed. ------------------------------------------------------------------------------------------------------------------  Cardiac Enzymes  Recent Labs Lab 07/10/16 2012  TROPONINI <0.03   ------------------------------------------------------------------------------------------------------------------  RADIOLOGY:  Dg Chest 2 View  Result Date: 07/13/2016 CLINICAL DATA:  Hypoxia, asthma, cirrhosis, COPD EXAM: CHEST  2 VIEW COMPARISON:  07/10/2016 FINDINGS: Mild cardiac enlargement with increased vascular and interstitial prominence throughout both lungs suspicious for volume overload versus early edema. No significant effusion or pneumothorax. No focal pneumonia, collapse or consolidation. Aorta is atherosclerotic. Trachea remains midline. Degenerative changes noted of the spine. IMPRESSION: Cardiomegaly with volume overload versus early edema pattern. Thoracic aortic atherosclerosis. Electronically Signed   By: Jerilynn Mages.  Shick M.D.   On: 07/13/2016 09:38    EKG:  Orders placed or performed during the hospital encounter of 04/04/16  . ED EKG  . ED EKG  . EKG 12-Lead  . EKG 12-Lead  . EKG 12-Lead  . EKG 12-Lead  . EKG 12-Lead  . EKG 12-Lead  . EKG    ASSESSMENT AND PLAN:   This is a 66 y.o. female with a history of Home O2 dependent COPD now being admitted with:  1. Acute exacerbation  of COPD - IV steroids and azithromycin will be continued - Continue Dulera, Singulair - Nebulizers, O2 therapy and expectorants as needed.   * pt have hypoxia today, Xray shows pulmonary edema   It is acute CHF- unknown systolic vs diastolic.   Will do echo, give IV lasix.  2. Hypokalemia-  resolved with potassium replacement potassium at 3.7 today  3. History of seizure disorder- had seizures here 07/12/16 morning.   She stopped taking seizure meds 2 weeks ago,    Neurology consulted to re-adjust dose of seizure meds.  given one dose keppra bolus 1000 mg today.  4. History of diabetes-hold glipizide, utilize regular insulin sliding scale coverage before meals and at bedtime  5. History of depression-continue Celexa  6. History of CAD- denies any chest paincontinue Plavix  7. History of hypertension- blood pressure is stable.continue Norvasc, enalapril  8. History of GERD-continue Prilosec   All the records are reviewed and case discussed with Care Management/Social Workerr. Management plans discussed with the patien with the help of sign language interpreter Ms. Santiago Glad , family and they are in agreement.  CODE STATUS: partial code, daughter is the healthcare power of attorney  TOTAL TIME TAKING CARE OF THIS PATIENT: 30 minutes.  Interviewed pt with help of sign language interpreter.  POSSIBLE D/C IN 2 DAYS DAYS, DEPENDING ON CLINICAL CONDITION.  Note: This dictation was prepared with Dragon dictation along with smaller phrase technology. Any transcriptional errors that result from this process are unintentional.   Vaughan Basta M.D on 07/13/2016 at 3:13 PM  Between 7am to 6pm - Pager - [phone] After 6pm go to [email or web address] - password [email or web address] [place]  Office  [phone]  CC: Primary care physician; Donnie Coffin, MD

## 2016-07-13 NOTE — Progress Notes (Signed)
Pt had seizure after getting up to use the BSC, 0340am.  O2 sat dropped to 89%, O2 increased to 5L and  31m Ativan given to pt, pt resting in room.

## 2016-07-14 ENCOUNTER — Inpatient Hospital Stay: Payer: BLUE CROSS/BLUE SHIELD

## 2016-07-14 DIAGNOSIS — R569 Unspecified convulsions: Secondary | ICD-10-CM

## 2016-07-14 LAB — BLOOD GAS, ARTERIAL
Acid-Base Excess: 6.5 mmol/L — ABNORMAL HIGH (ref 0.0–2.0)
BICARBONATE: 32.8 mmol/L — AB (ref 20.0–28.0)
FIO2: 0.36
O2 SAT: 88.7 %
PATIENT TEMPERATURE: 37
PO2 ART: 56 mmHg — AB (ref 83.0–108.0)
pCO2 arterial: 53 mmHg — ABNORMAL HIGH (ref 32.0–48.0)
pH, Arterial: 7.4 (ref 7.350–7.450)

## 2016-07-14 LAB — GLUCOSE, CAPILLARY
GLUCOSE-CAPILLARY: 296 mg/dL — AB (ref 65–99)
Glucose-Capillary: 264 mg/dL — ABNORMAL HIGH (ref 65–99)
Glucose-Capillary: 86 mg/dL (ref 65–99)
Glucose-Capillary: 222 mg/dL — ABNORMAL HIGH (ref 65–99)

## 2016-07-14 LAB — ECHOCARDIOGRAM COMPLETE
Height: 63 in
WEIGHTICAEL: 2640 [oz_av]

## 2016-07-14 MED ORDER — LORAZEPAM 2 MG/ML IJ SOLN: 1.0000 mg | Freq: Once | INTRAMUSCULAR | Status: DC

## 2016-07-14 MED ORDER — PREDNISONE 5 MG PO TABS
50.0000 mg | ORAL_TABLET | Freq: Every day | ORAL | Status: AC
  Administered 2016-07-16: 50 mg via ORAL
  Filled 2016-07-14: qty 2

## 2016-07-14 MED ORDER — FUROSEMIDE 10 MG/ML IJ SOLN
40.0000 mg | Freq: Two times a day (BID) | INTRAMUSCULAR | Status: DC
  Administered 2016-07-14 – 2016-07-17 (×6): 40 mg via INTRAVENOUS
  Filled 2016-07-14 (×6): qty 4

## 2016-07-14 MED ORDER — PREDNISONE 5 MG PO TABS: 30.0000 mg | ORAL_TABLET | Freq: Every day | ORAL | Status: DC

## 2016-07-14 MED ORDER — LACOSAMIDE 50 MG PO TABS
50.0000 mg | ORAL_TABLET | Freq: Two times a day (BID) | ORAL | Status: DC
  Administered 2016-07-14 – 2016-07-17 (×6): 50 mg via ORAL
  Filled 2016-07-14 (×6): qty 1

## 2016-07-14 MED ORDER — PREDNISONE 20 MG PO TABS: 20.0000 mg | ORAL_TABLET | Freq: Every day | ORAL | Status: DC

## 2016-07-14 MED ORDER — IOPAMIDOL (ISOVUE-370) INJECTION 76%
75.0000 mL | Freq: Once | INTRAVENOUS | Status: AC | PRN
  Administered 2016-07-14: 75 mL via INTRAVENOUS

## 2016-07-14 MED ORDER — PREDNISONE 20 MG PO TABS
60.0000 mg | ORAL_TABLET | Freq: Every day | ORAL | Status: AC
  Administered 2016-07-15: 08:00:00 60 mg via ORAL
  Filled 2016-07-14: qty 3

## 2016-07-14 MED ORDER — SODIUM CHLORIDE 0.9 % IV SOLN
200.0000 mg | Freq: Once | INTRAVENOUS | Status: AC
  Administered 2016-07-14: 200 mg via INTRAVENOUS
  Filled 2016-07-14: qty 20

## 2016-07-14 MED ORDER — PREDNISONE 5 MG PO TABS: 10.0000 mg | ORAL_TABLET | Freq: Every day | ORAL | Status: DC

## 2016-07-14 MED ORDER — LACOSAMIDE 200 MG/20ML IV SOLN
200.0000 mg | Freq: Once | INTRAVENOUS | Status: DC
  Filled 2016-07-14: qty 20

## 2016-07-14 MED ORDER — PREDNISONE 20 MG PO TABS
40.0000 mg | ORAL_TABLET | Freq: Every day | ORAL | Status: AC
  Administered 2016-07-17: 40 mg via ORAL
  Filled 2016-07-14: qty 2

## 2016-07-14 NOTE — Progress Notes (Signed)
[place] at [place] NAME: [name]    MR#:  [id number]  DATE OF BIRTH:  12-May-1951  SUBJECTIVE:  CHIEF COMPLAINT:  Pts sob is better, still have cough and some secretions.She had seizures 07/12/16 morning and again on 07/13/16 morning.   She was on some seizure meds, stopped taking as she had headaches with that 2 weeks ago.   her daughter was in room. Pt have increased oxygen requirement. Completely alert and oriented during my visit at 10 am, but later after 12- had multiple episodes of unresponsiveness. Vitals stable.  REVIEW OF SYSTEMS:   CONSTITUTIONAL: No fever, fatigue or weakness.  EYES: No blurred or double vision.  EARS, NOSE, AND THROAT: No tinnitus or ear pain.  RESPIRATORY: some cough, exertional shortness of breath, denies wheezing or hemoptysis.  CARDIOVASCULAR: No chest pain, orthopnea, edema.  GASTROINTESTINAL: No nausea, vomiting, diarrhea or abdominal pain.  GENITOURINARY: No dysuria, hematuria.  ENDOCRINE: No polyuria, nocturia,  HEMATOLOGY: No anemia, easy bruising or bleeding SKIN: No rash or lesion. MUSCULOSKELETAL: No joint pain or arthritis.   NEUROLOGIC: No tingling, numbness, weakness.  PSYCHIATRY: No anxiety or depression.   DRUG ALLERGIES:   Allergies  Allergen Reactions  . Aspirin   . Celebrex [Celecoxib]   . Ciprofloxacin   . Codeine   . Fosphenytoin   . Levaquin [Levofloxacin In D5w]   . Levofloxacin Other (See Comments)  . Lovastatin   . Penicillins   . Pravastatin   . Sulfa Antibiotics     VITALS:  Blood pressure (!) 169/73, pulse 83, temperature 98.1 F (36.7 C), temperature source Oral, resp. rate (!) 22, height _0  (1.6 m), weight 73.9 kg (163 lb), SpO2 95 %.  PHYSICAL EXAMINATION:  GENERAL:  66 y.o.-year-old patient lying in the bed with no acute distress.  EYES: Pupils equal, round, reactive to light and accommodation. No scleral icterus. Extraocular muscles intact.   HEENT: Head atraumatic, normocephalic. Oropharynx and nasopharynx clear.  NECK:  Supple, no jugular venous distention. No thyroid enlargement, no tenderness.  LUNGS: Mod breath sounds bilaterally, b/l wheezing, no crepitation. No use of accessory muscles of respiration. CARDIOVASCULAR: S1, S2 normal. No murmurs, rubs, or gallops.  ABDOMEN: Soft, nontender, nondistended. Bowel sounds present. No organomegaly or mass.  EXTREMITIES: No pedal edema, cyanosis, or clubbing.  NEUROLOGIC: pt is alert and oriented. PSYCHIATRIC: no depression/ anxiety.  SKIN: No obvious rash, lesion, or ulcer.   LABORATORY PANEL:   CBC  Recent Labs Lab 07/11/16 0618  WBC 6.0  HGB 16.0  HCT 47.5*  PLT 193   ------------------------------------------------------------------------------------------------------------------  Chemistries   Recent Labs Lab 07/10/16 2012  07/12/16 0526  NA 134*  < > 134*  K 2.9*  < > 4.8  CL 93*  < > 102  CO2 30  < > 25  GLUCOSE 348*  < > 319*  BUN 11  < > 19  CREATININE 0.69  < > 0.69  CALCIUM 9.2  < > 8.8*  AST 53*  --   --   ALT 33  --   --   ALKPHOS 113  --   --   BILITOT 1.1  --   --   < > = values in this interval not displayed. ------------------------------------------------------------------------------------------------------------------  Cardiac Enzymes  Recent Labs Lab 07/10/16 2012  TROPONINI <0.03   ------------------------------------------------------------------------------------------------------------------  RADIOLOGY:  Dg Chest 2 View  Result Date: 07/13/2016 CLINICAL DATA:  Hypoxia, asthma, cirrhosis, COPD EXAM: CHEST  2 VIEW COMPARISON:  07/10/2016 FINDINGS: Mild cardiac enlargement with increased vascular and interstitial prominence throughout both lungs suspicious for volume overload versus early edema. No significant effusion or pneumothorax. No focal pneumonia, collapse or consolidation. Aorta is atherosclerotic. Trachea remains  midline. Degenerative changes noted of the spine. IMPRESSION: Cardiomegaly with volume overload versus early edema pattern. Thoracic aortic atherosclerosis. Electronically Signed   By: Jerilynn Mages.  Shick M.D.   On: 07/13/2016 09:38    EKG:   Orders placed or performed during the hospital encounter of 04/04/16  . ED EKG  . ED EKG  . EKG 12-Lead  . EKG 12-Lead  . EKG 12-Lead  . EKG 12-Lead  . EKG 12-Lead  . EKG 12-Lead  . EKG    ASSESSMENT AND PLAN:   This is a 66 y.o. female with a history of Home O2 dependent COPD now being admitted with:  1. Acute exacerbation of COPD - IV steroids and azithromycin will be continued - Continue Dulera, Singulair - Nebulizers, O2 therapy and expectorants as needed.   * pt have hypoxia today, Xray shows pulmonary edema   acute diastolic CHF-    appreciated echo, give IV lasix.   Get CT chest for PE and get Pulm consult.  2. Hypokalemia-  resolved with potassium replacement.  3. History of seizure disorder- had seizures here 07/12/16 morning.   She stopped taking seizure meds 2 weeks ago,    Neurology consulted to re-adjust dose of seizure meds.    Dr. Jules Husbands- changed keppra to vimpat, as it was giving her side effect in past.  4. History of diabetes-hold glipizide, utilize regular insulin sliding scale coverage before meals and at bedtime  5. History of depression-continue Celexa  6. History of CAD- denies any chest paincontinue Plavix  7. History of hypertension- blood pressure is stable.continue Norvasc, enalapril  8. History of GERD-continue Prilosec   All the records are reviewed and case discussed with Care Management/Social Workerr. Management plans discussed with the patien with the help of sign language interpreter Ms. Santiago Glad , family and they are in agreement.  CODE STATUS: partial code, daughter is the healthcare power of attorney  TOTAL TIME TAKING CARE OF THIS PATIENT: 30 minutes.  Interviewed pt with help of her  daughter.  POSSIBLE D/C IN 2 DAYS DAYS, DEPENDING ON CLINICAL CONDITION.  Note: This dictation was prepared with Dragon dictation along with smaller phrase technology. Any transcriptional errors that result from this process are unintentional.   Vaughan Basta M.D on 07/14/2016 at 4:41 PM  Between 7am to 6pm - Pager - [phone] After 6pm go to [email or web address] - password [email or web address] [place] [facility]  Office  [phone]  CC: Primary care physician; Donnie Coffin, MD

## 2016-07-14 NOTE — Progress Notes (Addendum)
Subjective: No further seizures documented since the morning of 1/7.  Patient lethargic but arousable.  From conversation with attending [ateint does not appear to have been lethargic earlier this morning and there is some question as to whether this represents post-ictal activity.  No ictal activity has bee noted.  On Keppra.    Objective: Current vital signs: BP (!) 169/73   Pulse 83   Temp 98.1 F (36.7 C) (Oral)   Resp (!) 22   Ht 5' 3" (1.6 m)   Wt 73.9 kg (163 lb)   SpO2 93%   BMI 28.87 kg/m  Vital signs in last 24 hours: Temp:  [97.8 F (36.6 C)-98.1 F (36.7 C)] 98.1 F (36.7 C) (01/08 0520) Pulse Rate:  [73-83] 83 (01/08 0520) Resp:  [14-24] 22 (01/08 0520) BP: (115-169)/(51-73) 169/73 (01/08 1142) SpO2:  [91 %-96 %] 93 % (01/08 0819) Weight:  [73.9 kg (163 lb)] 73.9 kg (163 lb) (01/08 0600)  Intake/Output from previous day: 01/07 0701 - 01/08 0700 In: 60 [P.O.:60] Out: 350 [Urine:350] Intake/Output this shift: Total I/O In: -  Out: 150 [Urine:150] Nutritional status: Diet heart healthy/carb modified Room service appropriate? Yes; Fluid consistency: Thin  Neurologic Exam: Mental Status: Lethargic.  Follows commands.   Cranial Nerves: II: Discs flat bilaterally; Visual fields grossly normal, pupils equal, round, reactive to light and accommodation III,IV, VI: ptosis not present, extra-ocular motions intact bilaterally V,VII: smile symmetric, facial light touch sensation normal bilaterally VIII: deaf IX,X: gag reflex present XI: bilateral shoulder shrug XII: midline tongue extension Motor: Lifts all extremities against gravity.     Lab Results: Basic Metabolic Panel:  Recent Labs Lab 07/10/16 2012 07/11/16 0618 07/12/16 0526  NA 134* 132* 134*  K 2.9* 3.7 4.8  CL 93* 96* 102  CO2 _0 GLUCOSE 348* 398* 319*  BUN _1 CREATININE 0.69 0.54 0.69  CALCIUM 9.2 9.0 8.8*    Liver Function Tests:  Recent Labs Lab 07/10/16 2012  AST  53*  ALT 33  ALKPHOS 113  BILITOT 1.1  PROT 6.7  ALBUMIN 3.2*   No results for input(s): LIPASE, AMYLASE in the last 168 hours. No results for input(s): AMMONIA in the last 168 hours.  CBC:  Recent Labs Lab 07/10/16 2012 07/11/16 0618  WBC 7.3 6.0  NEUTROABS 5.0  --   HGB 16.3* 16.0  HCT 48.0* 47.5*  MCV 83.8 84.2  PLT 212 193    Cardiac Enzymes:  Recent Labs Lab 07/10/16 2012  TROPONINI <0.03    Lipid Panel: No results for input(s): CHOL, TRIG, HDL, CHOLHDL, VLDL, LDLCALC in the last 168 hours.  CBG:  Recent Labs Lab 07/13/16 0731 07/13/16 1152 07/13/16 1706 07/13/16 2134 07/14/16 0817  GLUCAP 289* 285* 85 201* 296*    Microbiology: Results for orders placed or performed during the hospital encounter of 07/10/16  Blood culture (routine x 2)     Status: None (Preliminary result)   Collection Time: 07/10/16  8:12 PM  Result Value Ref Range Status   Specimen Description BLOOD RIGHT AC  Final   Special Requests   Final    BOTTLES DRAWN AEROBIC AND ANAEROBIC AER 7ML ANA 4ML   Culture NO GROWTH 4 DAYS  Final   Report Status PENDING  Incomplete  Blood culture (routine x 2)     Status: None (Preliminary result)   Collection Time: 07/10/16  8:12 PM  Result Value Ref Range Status   Specimen Description BLOOD RIGHT FA  Final   Special Requests   Final    BOTTLES DRAWN AEROBIC AND ANAEROBIC AER 9ML ANA 12ML   Culture NO GROWTH 4 DAYS  Final   Report Status PENDING  Incomplete  MRSA PCR Screening     Status: Abnormal   Collection Time: 07/11/16  7:37 AM  Result Value Ref Range Status   MRSA by PCR POSITIVE (A) NEGATIVE Final    Comment:        The GeneXpert MRSA Assay (FDA approved for NASAL specimens only), is one component of a comprehensive MRSA colonization surveillance program. It is not intended to diagnose MRSA infection nor to guide or monitor treatment for MRSA infections. RESULT CALLED TO, READ BACK BY AND VERIFIED WITH: DANA JUGGINS ON  07/11/16 AT 0921 MNS     Coagulation Studies: No results for input(s): LABPROT, INR in the last 72 hours.  Imaging: Dg Chest 2 View  Result Date: 07/13/2016 CLINICAL DATA:  Hypoxia, asthma, cirrhosis, COPD EXAM: CHEST  2 VIEW COMPARISON:  07/10/2016 FINDINGS: Mild cardiac enlargement with increased vascular and interstitial prominence throughout both lungs suspicious for volume overload versus early edema. No significant effusion or pneumothorax. No focal pneumonia, collapse or consolidation. Aorta is atherosclerotic. Trachea remains midline. Degenerative changes noted of the spine. IMPRESSION: Cardiomegaly with volume overload versus early edema pattern. Thoracic aortic atherosclerosis. Electronically Signed   By: Jerilynn Mages.  Shick M.D.   On: 07/13/2016 09:38    Medications:  I have reviewed the patient's current medications. Scheduled: . amLODipine  10 mg Oral Daily  . azithromycin  500 mg Intravenous Daily  . citalopram  20 mg Oral Daily  . clopidogrel  75 mg Oral Daily  . dextromethorphan  30 mg Oral BID   And  . guaiFENesin  600 mg Oral BID  . enalapril  20 mg Oral Daily  . enoxaparin (LOVENOX) injection  40 mg Subcutaneous QHS  . furosemide  40 mg Intravenous Q12H  . gabapentin  300 mg Oral TID  . insulin aspart  0-20 Units Subcutaneous TID WC  . insulin aspart  0-5 Units Subcutaneous QHS  . ipratropium-albuterol  3 mL Nebulization Q4H  . levETIRAcetam  1,000 mg Oral BID  . methylPREDNISolone (SOLU-MEDROL) injection  40 mg Intravenous Q8H  . mometasone-formoterol  2 puff Inhalation BID  . montelukast  10 mg Oral QHS  . naproxen  500 mg Oral BID WC  . pantoprazole  40 mg Oral QAC breakfast  . rOPINIRole  0.25 mg Oral TID  . sodium chloride flush  3 mL Intravenous Q12H    Assessment/Plan: Patient lethargic but no further seizures noted.  On Keppra.  Head CT unremarkable.  Precipitating factor felt to be hypoxia.  EEG shows normal drowse and sleep.  Patient now on dose of Keppra that  was causing side effects prior to now.  Unclear if some of her lethargy may be from the [place] as well.  May benefit from AED change.    Recommendations: 1.  Vimpat 28m IV now as loading dose 2.  Vimpat 556mBID as maintenance.  3.  D/C Keppra.      LOS: 4 days   LeAlexis GoodellMD Neurology [phone]/02/2017  11:46 AM

## 2016-07-14 NOTE — Progress Notes (Signed)
Inpatient Diabetes Program Recommendations  AACE/ADA: New Consensus Statement on Inpatient Glycemic Control (2015)  Target Ranges:  Prepandial:   less than 140 mg/dL      Peak postprandial:   less than 180 mg/dL (1-2 hours)      Critically ill patients:  140 - 180 mg/dL  Results for [name], [name] (MRN [id number]) as of 07/14/2016 08:37  Ref. Range 07/13/2016 07:31 07/13/2016 11:52 07/13/2016 17:06 07/13/2016 21:34 07/14/2016 08:17  Glucose-Capillary Latest Ref Range: 65 - 99 mg/dL 289 (H) 285 (H) 85 201 (H) 296 (H)    Review of Glycemic Control  Diabetes history: DM2 Outpatient Diabetes medications: Glipizide XL 5 mg daily Current orders for Inpatient glycemic control: Novolog 0-20 units TID with meals, Novolog 0-5 units QHS  Inpatient Diabetes Program Recommendations: Insulin - Basal: If steroids are continued as ordered, please consider ordering Lantus 14 units Q24H starting now (based on 73 kg x 0.2 units). Insulin - Meal Coverage: If steroids are continued and patient is eating at least 50% of meals, please consider ordering Novolog 3 units TID with meals for meal coverage.  Thanks, Barnie Alderman, RN, MSN, CDE Diabetes Coordinator Inpatient Diabetes Program [phone] (Team Pager from 8am to 5pm)

## 2016-07-14 NOTE — Care Management (Addendum)
Admitted to this facility with the diagnosis of COPD. Deaf, but reads lips. Lives alone. Daughter is Annalisa [phone]), Dr Clide Deutscher is listed as primary care physician. Last seen Dr. Raul Del 07/09/16. [place] in the past for Nursing services.  Noctural oxygen per Leshara RN MSN CCM Care Management

## 2016-07-14 NOTE — Progress Notes (Signed)
Date: 07/14/2016,   MRN# [id number] [name] 11-24-1950 Code Status:     Code Status Orders        Start     Ordered   07/11/16 1131  Limited resuscitation (code)  Continuous    Question Answer Comment  In the event of cardiac or respiratory ARREST: Initiate Code Blue, Call Rapid Response Yes   In the event of cardiac or respiratory ARREST: Perform CPR Yes   In the event of cardiac or respiratory ARREST: Perform Intubation/Mechanical Ventilation No   In the event of cardiac or respiratory ARREST: Use NIPPV/BiPAp only if indicated Yes   In the event of cardiac or respiratory ARREST: Administer ACLS medications if indicated Yes   In the event of cardiac or respiratory ARREST: Perform Defibrillation or Cardioversion if indicated Yes      07/11/16 1131    Code Status History    Date Active Date Inactive Code Status Order ID Comments User Context   07/11/2016  2:37 AM 07/11/2016 11:31 AM Full Code [id number]  Harvie Bridge, DO Inpatient   03/22/2016 10:44 PM 03/24/2016  7:12 PM Partial Code [id number]  Toy Baker, MD Inpatient   03/29/2015 11:30 AM 03/31/2015  2:21 PM Partial Code [id number]  Loletha Grayer, MD ED     Hosp day:_0 @ Referring MD: _1 @        AdmissionWeight: 165 lb (74.8 kg)                 CurrentWeight: 163 lb (73.9 kg)  CC: copd exacerbation  HPI: This is a 66 year old lady, well known to [place]. She has been c/o x several weeks of cough, wheezing and some shortness of breath. She has had two rounds of antibiotics (doxycycline/zithromax), prednisone in addition to her routine copd regimen as listed. Symptoms worsened and hence came in with marginal sats.    PMHX:   Past Medical History:  Diagnosis Date  . Asthma   . Cirrhosis, non-alcoholic ([place])   . COPD (chronic obstructive pulmonary disease) ([place])   . Deaf   . Depression   . Diabetes mellitus without complication ([place])   . GERD (gastroesophageal reflux disease)   . Heart murmur   .  Hepatitis   . Hypertension   . Lymph node disorder    arm  . Neuropathy ([place])   . On home oxygen therapy    hs  . Orthopnea   . RLS (restless legs syndrome)   . Seizures ([place])   . Shortness of breath dyspnea   . Sleep apnea   . Stroke [facility])    tia   Surgical Hx:  Past Surgical History:  Procedure Laterality Date  . CATARACT EXTRACTION W/PHACO Right 11/23/2014   Procedure: CATARACT EXTRACTION PHACO AND INTRAOCULAR LENS PLACEMENT (IOC);  Surgeon: Lyla Glassing, MD;  Location: [facility] ORS;  Service: Ophthalmology;  Laterality: Right;  . CATARACT EXTRACTION W/PHACO Left 12/14/2014   Procedure: CATARACT EXTRACTION PHACO AND INTRAOCULAR LENS PLACEMENT (IOC);  Surgeon: Lyla Glassing, MD;  Location: [facility] ORS;  Service: Ophthalmology;  Laterality: Left;  US:01:16.6 AP:15.8 CDE:12.14  . CESAREAN SECTION    . CHOLECYSTECTOMY    . KNEE ARTHROPLASTY    . THUMB ARTHROSCOPY    . TONSILLECTOMY    . TYMPANOPLASTY     muliple   Family Hx:  Family History  Problem Relation Age of Onset  . Lung cancer Mother   . CAD Father    Social Hx:   Social History  Substance Use  Topics  . Smoking status: Current Every Day Smoker    Packs/day: 0.50  . Smokeless tobacco: Never Used  . Alcohol use No   Medication:    Home Medication:    Current Medication: _0 @   Allergies:  Aspirin; Celebrex [celecoxib]; Ciprofloxacin; Codeine; Fosphenytoin; Levaquin [levofloxacin in d5w]; Levofloxacin; Lovastatin; Penicillins; Pravastatin; and Sulfa antibiotics  Review of Systems: Gen:  Denies  fever, sweats, chills HEENT: Denies blurred vision, double vision, ear pain, eye pain, hearing loss, nose bleeds, sore throat Cvc:  No dizziness, chest pain or heaviness Resp:    Gi: Denies swallowing difficulty, stomach pain, nausea or vomiting, diarrhea, constipation, bowel incontinence Gu:  Denies bladder incontinence, burning urine Ext:   No Joint pain, stiffness or swelling Skin: No skin rash, easy  bruising or bleeding or hives Endoc:  No polyuria, polydipsia , polyphagia or weight change Psych: No depression, insomnia or hallucinations  Other:  All other systems negative  Physical Examination:   VS: BP (!) 169/73   Pulse 83   Temp 98.1 F (36.7 C) (Oral)   Resp (!) 22   Ht _1  (1.6 m)   Wt 163 lb (73.9 kg)   SpO2 95%   BMI 28.87 kg/m   General Appearance: No distress  Neuro: without focal findings, mental status, speech normal, alert and oriented, cranial nerves 2-12 intact, reflexes normal and symmetric, sensation grossly normal  HEENT: PERRLA, EOM intact, no ptosis, no other lesions noticed: Pulmonary:.No wheezing, No rales  Sputum Production:   Cardiovascular:  Normal S1,S2.  No m/r/g.  Abdominal aorta pulsation normal.    Abdomen:Benign, Soft, non-tender, No masses, hepatosplenomegaly, No lymphadenopathy Endoc: No evident thyromegaly, no signs of acromegaly or Cushing features Skin:   warm, no rashes, no ecchymosis  Extremities: normal, no cyanosis, clubbing, no edema, warm with normal capillary refill. Other findings:   Labs results:   Recent Labs     07/12/16  0526  BUN  19  CREATININE  0.69  GLUCOSE  319*  CALCIUM  8.8*  ,       Rad results:  EXAM: CHEST  2 VIEW  COMPARISON:  07/10/2016  FINDINGS: Mild cardiac enlargement with increased vascular and interstitial prominence throughout both lungs suspicious for volume overload versus early edema. No significant effusion or pneumothorax. No focal pneumonia, collapse or consolidation. Aorta is atherosclerotic. Trachea remains midline. Degenerative changes noted of the spine.  IMPRESSION: Cardiomegaly with volume overload versus early edema pattern.  Thoracic aortic atherosclerosis.   Electronically Signed   By: Jerilynn Mages.  Shick M.D.   On: 07/13/2016 09:38     Assessment and Plan: Full note to follow Continue as is   I have personally obtained a history, examined the patient,  evaluated laboratory and imaging results, formulated the assessment and plan and placed orders.  The Patient requires high complexity decision making for assessment and support, frequent evaluation and titration of therapies, application of advanced monitoring technologies and extensive interpretation of multiple databases.   Herbon Fleming,M.D. Pulmonary & Critical care Medicine [facility]

## 2016-07-14 NOTE — Progress Notes (Signed)
Writer called to room when pt was brought back in to room from EEG. Pt in and out of consciousness. VSS. MD notified, neurologist notified. Orders received. Pt awake, alert, and oriented currently. Ammie Dalton, RN

## 2016-07-15 LAB — BASIC METABOLIC PANEL
Anion gap: 7 (ref 5–15)
BUN: 22 mg/dL — AB (ref 6–20)
CHLORIDE: 96 mmol/L — AB (ref 101–111)
CO2: 35 mmol/L — ABNORMAL HIGH (ref 22–32)
CREATININE: 0.46 mg/dL (ref 0.44–1.00)
Calcium: 9 mg/dL (ref 8.9–10.3)
GFR calc Af Amer: >60 mL/min (ref >60)
GFR calc non Af Amer: >60 mL/min (ref >60)
Glucose, Bld: 133 mg/dL — ABNORMAL HIGH (ref 65–99)
Potassium: 4.6 mmol/L (ref 3.5–5.1)
SODIUM: 138 mmol/L (ref 135–145)

## 2016-07-15 LAB — GLUCOSE, CAPILLARY
GLUCOSE-CAPILLARY: 128 mg/dL — AB (ref 65–99)
Glucose-Capillary: 274 mg/dL — ABNORMAL HIGH (ref 65–99)
Glucose-Capillary: 301 mg/dL — ABNORMAL HIGH (ref 65–99)
Glucose-Capillary: 83 mg/dL (ref 65–99)

## 2016-07-15 LAB — CBC
HCT: 45.4 % (ref 35.0–47.0)
Hemoglobin: 15.3 g/dL (ref 12.0–16.0)
MCH: 28.1 pg (ref 26.0–34.0)
MCHC: 33.7 g/dL (ref 32.0–36.0)
MCV: 83.4 fL (ref 80.0–100.0)
PLATELETS: 195 10*3/uL (ref 150–440)
RBC: 5.45 MIL/uL — ABNORMAL HIGH (ref 3.80–5.20)
RDW: 14 % (ref 11.5–14.5)
WBC: 10.6 10*3/uL (ref 3.6–11.0)

## 2016-07-15 LAB — CULTURE, BLOOD (ROUTINE X 2)
CULTURE: NO GROWTH
CULTURE: NO GROWTH

## 2016-07-15 MED ORDER — CEFTRIAXONE SODIUM 1 G IJ SOLR: 1.0000 g | INTRAMUSCULAR | Status: DC

## 2016-07-15 MED ORDER — VANCOMYCIN HCL 10 G IV SOLR
1250.0000 mg | INTRAVENOUS | Status: DC
  Administered 2016-07-15 – 2016-07-17 (×3): 1250 mg via INTRAVENOUS
  Filled 2016-07-15 (×5): qty 1250

## 2016-07-15 MED ORDER — CHLORHEXIDINE GLUCONATE CLOTH 2 % EX PADS
6.0000 | MEDICATED_PAD | Freq: Every day | CUTANEOUS | Status: DC
  Administered 2016-07-16 – 2016-07-17 (×2): 6 via TOPICAL

## 2016-07-15 MED ORDER — LEVOFLOXACIN 750 MG PO TABS
750.0000 mg | ORAL_TABLET | Freq: Every day | ORAL | Status: DC
  Administered 2016-07-15 – 2016-07-16 (×2): 750 mg via ORAL
  Filled 2016-07-15 (×2): qty 1

## 2016-07-15 MED ORDER — MUPIROCIN 2 % EX OINT
1.0000 "application " | TOPICAL_OINTMENT | Freq: Two times a day (BID) | CUTANEOUS | Status: DC
  Administered 2016-07-15 – 2016-07-17 (×4): 1 via NASAL
  Filled 2016-07-15: qty 22

## 2016-07-15 NOTE — Progress Notes (Signed)
Per Dr. Anselm Jungling ok to d/c cardiac monitor.  Clarise Cruz, RN

## 2016-07-15 NOTE — Progress Notes (Signed)
Pharmacy Antibiotic Note  [name] is a 65 y.o. female admitted on 07/10/2016 with pneumonia.  Pharmacy has been consulted for vancomycin dosing.  Plan: Vancomycin 1250 mg IV q18h. Goal trough 15-20 mcg/ml. Trough before 4th dose.   Ke 0.053, half life 13 h, Vd 44L  Height: _0  (160 cm) Weight: 163 lb (73.9 kg) IBW/kg (Calculated) : 52.4  Temp (24hrs), Avg:98 F (36.7 C), Min:97.7 F (36.5 C), Max:98.3 F (36.8 C)   Recent Labs Lab 07/10/16 2012 07/11/16 0618 07/12/16 0526 07/15/16 0357  WBC 7.3 6.0  --  10.6  CREATININE 0.69 0.54 0.69 0.46    Estimated Creatinine Clearance: 68.4 mL/min (by C-G formula based on SCr of 0.46 mg/dL).    Allergies  Allergen Reactions  . Aspirin   . Celebrex [Celecoxib]   . Ciprofloxacin   . Codeine   . Fosphenytoin   . Levaquin [Levofloxacin In D5w]   . Levofloxacin Other (See Comments)  . Lovastatin   . Penicillins   . Pravastatin   . Sulfa Antibiotics     Antimicrobials this admission: azithro 1/5 >> 1/9 vanc 1/9 >>  Dose adjustments this admission:   Microbiology results: 1/4 BCx: NG final Sputum: ordered MRSA PCR: +  Thank you for allowing pharmacy to be a part of this patient's care.  Rocky Morel 07/15/2016 10:57 AM

## 2016-07-15 NOTE — Consult Note (Signed)
Subjective: Patient has had no further episodes of altered mental status.  Did not require Ativan on yesterday.  Now on Vimpat.    Objective: Current vital signs: BP (!) 148/63 (BP Location: Right Arm)   Pulse 69   Temp 97.7 F (36.5 C) (Oral)   Resp 16   Ht _0  (1.6 m)   Wt 73.9 kg (163 lb)   SpO2 94%   BMI 28.87 kg/m  Vital signs in last 24 hours: Temp:  [97.7 F (36.5 C)-98.3 F (36.8 C)] 97.7 F (36.5 C) (01/09 0533) Pulse Rate:  [69-84] 69 (01/09 0533) Resp:  [16-20] 16 (01/09 0533) BP: (148-168)/(63-73) 148/63 (01/09 0533) SpO2:  [91 %-98 %] 94 % (01/09 1134) FiO2 (%):  [36 %] 36 % (01/09 1134)  Intake/Output from previous day: 01/08 0701 - 01/09 0700 In: -  Out: 150 [Urine:150] Intake/Output this shift: No intake/output data recorded. Nutritional status: Diet heart healthy/carb modified Room service appropriate? Yes; Fluid consistency: Thin  Neurologic Exam: Mental Status: Alert.  Follows commands.  Able to read lips.  Speech fluent.   Cranial Nerves: II: Discs flat bilaterally; Visual fields grossly normal, pupils equal, round, reactive to light and accommodation III,IV, VI: ptosis not present, extra-ocular motions intact bilaterally V,VII: smile symmetric, facial light touch sensation normal bilaterally VIII: hearing normal bilaterally IX,X: gag reflex present XI: bilateral shoulder shrug XII: midline tongue extension Motor: Lifts all extremities against gravity   Lab Results: Basic Metabolic Panel:  Recent Labs Lab 07/10/16 2012 07/11/16 0618 07/12/16 0526 07/15/16 0357  NA 134* 132* 134* 138  K 2.9* 3.7 4.8 4.6  CL 93* 96* 102 96*  CO2 _1 35*  GLUCOSE 348* 398* 319* 133*  BUN _2 22*  CREATININE 0.69 0.54 0.69 0.46  CALCIUM 9.2 9.0 8.8* 9.0    Liver Function Tests:  Recent Labs Lab 07/10/16 2012  AST 53*  ALT 33  ALKPHOS 113  BILITOT 1.1  PROT 6.7  ALBUMIN 3.2*   No results for input(s): LIPASE, AMYLASE in the  last 168 hours. No results for input(s): AMMONIA in the last 168 hours.  CBC:  Recent Labs Lab 07/10/16 2012 07/11/16 0618 07/15/16 0357  WBC 7.3 6.0 10.6  NEUTROABS 5.0  --   --   HGB 16.3* 16.0 15.3  HCT 48.0* 47.5* 45.4  MCV 83.8 84.2 83.4  PLT 212 193 195    Cardiac Enzymes:  Recent Labs Lab 07/10/16 2012  TROPONINI <0.03    Lipid Panel: No results for input(s): CHOL, TRIG, HDL, CHOLHDL, VLDL, LDLCALC in the last 168 hours.  CBG:  Recent Labs Lab 07/14/16 1137 07/14/16 1716 07/14/16 2043 07/15/16 0737 07/15/16 1140  GLUCAP 264* 86 222* 128* 274*    Microbiology: Results for orders placed or performed during the hospital encounter of 07/10/16  Blood culture (routine x 2)     Status: None   Collection Time: 07/10/16  8:12 PM  Result Value Ref Range Status   Specimen Description BLOOD RIGHT AC  Final   Special Requests   Final    BOTTLES DRAWN AEROBIC AND ANAEROBIC AER 7ML ANA 4ML   Culture NO GROWTH 5 DAYS  Final   Report Status 07/15/2016 FINAL  Final  Blood culture (routine x 2)     Status: None   Collection Time: 07/10/16  8:12 PM  Result Value Ref Range Status   Specimen Description BLOOD RIGHT FA  Final   Special Requests   Final  BOTTLES DRAWN AEROBIC AND ANAEROBIC AER 9ML ANA 12ML   Culture NO GROWTH 5 DAYS  Final   Report Status 07/15/2016 FINAL  Final  MRSA PCR Screening     Status: Abnormal   Collection Time: 07/11/16  7:37 AM  Result Value Ref Range Status   MRSA by PCR POSITIVE (A) NEGATIVE Final    Comment:        The GeneXpert MRSA Assay (FDA approved for NASAL specimens only), is one component of a comprehensive MRSA colonization surveillance program. It is not intended to diagnose MRSA infection nor to guide or monitor treatment for MRSA infections. RESULT CALLED TO, READ BACK BY AND VERIFIED WITH: DANA JUGGINS ON 07/11/16 AT 0921 MNS     Coagulation Studies: No results for input(s): LABPROT, INR in the last 72  hours.  Imaging: Ct Angio Chest Pe W Or Wo Contrast  Result Date: 07/14/2016 CLINICAL DATA:  Short of breath EXAM: CT ANGIOGRAPHY CHEST WITH CONTRAST TECHNIQUE: Multidetector CT imaging of the chest was performed using the standard protocol during bolus administration of intravenous contrast. Multiplanar CT image reconstructions and MIPs were obtained to evaluate the vascular anatomy. CONTRAST:  75 cc Isovue 370 COMPARISON:  11/05/2013 FINDINGS: Cardiovascular: There are no filling defects in the pulmonary arterial tree to suggest acute pulmonary thromboembolism. The heart is mildly enlarged. Three vessel coronary artery calcification. Atherosclerotic calcifications of the aortic arch are present without evidence of dissection or aneurysm. Great vessels are grossly patent within the confines of the examination. Mediastinum/Nodes: Borderline enlarged mediastinal nodes are stable. Small hiatal hernia is noted. Lungs/Pleura: No pneumothorax or pleural effusion. Patchy ground-glass opacities are scattered throughout the right lung. Minimal ground-glass opacities at the lateral left base. There is bronchial wall thickening within the central lobar airways primarily involving the lower lobes. Upper Abdomen: Postcholecystectomy. Anterior contour of the liver is slightly nodular. This is stable. No splenomegaly. No obvious varices. No other features of cirrhosis. Musculoskeletal: No vertebral compression deformity. Review of the MIP images confirms the above findings. IMPRESSION: No evidence of acute pulmonary thromboembolism Patchy ground-glass opacities are scattered throughout the right lung and to a lesser degree at the lateral left base. These findings are most consistent with an inflammatory process such as bronchopneumonia. Initial follow-up by chest CT without contrast is recommended in 3 months to confirm persistence. This recommendation follows the consensus statement: Recommendations for the Management of  Subsolid Pulmonary Nodules Detected at CT: A Statement from the [place] as published in Radiology 2013; 266:304-317. Electronically Signed   By: Marybelle Killings M.D.   On: 07/14/2016 16:52    Medications:  I have reviewed the patient's current medications. Scheduled: . amLODipine  10 mg Oral Daily  . citalopram  20 mg Oral Daily  . clopidogrel  75 mg Oral Daily  . dextromethorphan  30 mg Oral BID   And  . guaiFENesin  600 mg Oral BID  . enalapril  20 mg Oral Daily  . enoxaparin (LOVENOX) injection  40 mg Subcutaneous QHS  . furosemide  40 mg Intravenous Q12H  . gabapentin  300 mg Oral TID  . insulin aspart  0-20 Units Subcutaneous TID WC  . insulin aspart  0-5 Units Subcutaneous QHS  . ipratropium-albuterol  3 mL Nebulization Q4H  . lacosamide  50 mg Oral BID  . LORazepam  1 mg Intravenous Once  . mometasone-formoterol  2 puff Inhalation BID  . montelukast  10 mg Oral QHS  . pantoprazole  40 mg Oral  QAC breakfast  . [START ON 07/16/2016] predniSONE  50 mg Oral Q breakfast   Followed by  . [START ON 07/17/2016] predniSONE  40 mg Oral Q breakfast   Followed by  . [START ON 07/18/2016] predniSONE  30 mg Oral Q breakfast   Followed by  . [START ON 07/19/2016] predniSONE  20 mg Oral Q breakfast   Followed by  . [START ON 07/20/2016] predniSONE  10 mg Oral Q breakfast  . rOPINIRole  0.25 mg Oral TID  . sodium chloride flush  3 mL Intravenous Q12H  . vancomycin  1,250 mg Intravenous Q18H    Assessment/Plan: Patient appears at baseline today.  On Vimpat and appears to be tolerating well.  Recommendations: 1.  Continue Vimpat at 37m BID 2.  Continue seizure precautions   LOS: 5 days   LAlexis Goodell MD Neurology [phone] [id number]/03/2017  12:13 PM

## 2016-07-15 NOTE — Progress Notes (Signed)
Date: 07/15/2016,   MRN# [id number] [name] February 22, 1951 Code Status:     Code Status Orders        Start     Ordered   07/11/16 1131  Limited resuscitation (code)  Continuous    Question Answer Comment  In the event of cardiac or respiratory ARREST: Initiate Code Blue, Call Rapid Response Yes   In the event of cardiac or respiratory ARREST: Perform CPR Yes   In the event of cardiac or respiratory ARREST: Perform Intubation/Mechanical Ventilation No   In the event of cardiac or respiratory ARREST: Use NIPPV/BiPAp only if indicated Yes   In the event of cardiac or respiratory ARREST: Administer ACLS medications if indicated Yes   In the event of cardiac or respiratory ARREST: Perform Defibrillation or Cardioversion if indicated Yes      07/11/16 1131    Code Status History    Date Active Date Inactive Code Status Order ID Comments User Context   07/11/2016  2:37 AM 07/11/2016 11:31 AM Full Code [id number]  Harvie Bridge, DO Inpatient   03/22/2016 10:44 PM 03/24/2016  7:12 PM Partial Code [id number]  Toy Baker, MD Inpatient   03/29/2015 11:30 AM 03/31/2015  2:21 PM Partial Code [id number]  Loletha Grayer, MD ED     Hosp day:_0 @ Referring MD: _1 @         HPI: Here with sob, bronchospasm, bilateral pneumonia. On appropriate tx, slowly improving. Less wheezing and cough. No new complaints.   PMHX:   Past Medical History:  Diagnosis Date  . Asthma   . Cirrhosis, non-alcoholic ([place])   . COPD (chronic obstructive pulmonary disease) ([place])   . Deaf   . Depression   . Diabetes mellitus without complication ([place])   . GERD (gastroesophageal reflux disease)   . Heart murmur   . Hepatitis   . Hypertension   . Lymph node disorder    arm  . Neuropathy ([place])   . On home oxygen therapy    hs  . Orthopnea   . RLS (restless legs syndrome)   . Seizures ([place])   . Shortness of breath dyspnea   . Sleep apnea   . Stroke [facility])    tia   Surgical Hx:  Past  Surgical History:  Procedure Laterality Date  . CATARACT EXTRACTION W/PHACO Right 11/23/2014   Procedure: CATARACT EXTRACTION PHACO AND INTRAOCULAR LENS PLACEMENT (IOC);  Surgeon: Lyla Glassing, MD;  Location: [facility] ORS;  Service: Ophthalmology;  Laterality: Right;  . CATARACT EXTRACTION W/PHACO Left 12/14/2014   Procedure: CATARACT EXTRACTION PHACO AND INTRAOCULAR LENS PLACEMENT (IOC);  Surgeon: Lyla Glassing, MD;  Location: [facility] ORS;  Service: Ophthalmology;  Laterality: Left;  US:01:16.6 AP:15.8 CDE:12.14  . CESAREAN SECTION    . CHOLECYSTECTOMY    . KNEE ARTHROPLASTY    . THUMB ARTHROSCOPY    . TONSILLECTOMY    . TYMPANOPLASTY     muliple   Family Hx:  Family History  Problem Relation Age of Onset  . Lung cancer Mother   . CAD Father    Social Hx:   Social History  Substance Use Topics  . Smoking status: Current Every Day Smoker    Packs/day: 0.50  . Smokeless tobacco: Never Used  . Alcohol use No   Medication:    Home Medication:    Current Medication: _2 @   Allergies:  Aspirin; Celebrex [celecoxib]; Ciprofloxacin; Codeine; Fosphenytoin; Levaquin [levofloxacin in d5w]; Levofloxacin; Lovastatin; Penicillins; Pravastatin; and Sulfa antibiotics  Review of Systems: Gen:  Denies  fever, sweats, chills HEENT: Denies blurred vision, double vision, ear pain, eye pain, hearing loss, nose bleeds, sore throat Cvc:  No dizziness, chest pain or heaviness Resp:  Coughing and wheezing but less.   Gi: Denies swallowing difficulty, stomach pain, nausea or vomiting, diarrhea, constipation, bowel incontinence Gu:  Denies bladder incontinence, burning urine Ext:   No Joint pain, stiffness or swelling Skin: No skin rash, easy bruising or bleeding or hives Endoc:  No polyuria, polydipsia , polyphagia or weight change Psych: No depression, insomnia or hallucinations  Other:  All other systems negative  Physical Examination:   VS: BP (!) 159/63 (BP Location: Right Arm)    Pulse 87   Temp 97.5 F (36.4 C) (Oral)   Resp 20   Ht _0  (1.6 m)   Wt 163 lb (73.9 kg)   SpO2 91%   BMI 28.87 kg/m   General Appearance: No distress, awaken, on 4 liters [place] o2  Neuro: without focal findings, mental status, speech normal, alert and oriented, cranial nerves 2-12 intact, reflexes normal and symmetric, sensation grossly normal  HEENT: PERRLA, EOM intact, no ptosis, no other lesions noticed Pulmonary:.wheezing, No rales  :   Cardiovascular:  Normal S1,S2.  No m/r/g.  Abdominal aorta pulsation normal.    Abdomen:Benign, Soft, non-tender, No masses, hepatosplenomegaly, No lymphadenopathy Endoc: No evident thyromegaly, no signs of acromegaly or Cushing features Skin:   warm, no rashes, no ecchymosis  Extremities: normal, no cyanosis, clubbing, no edema, warm with normal capillary refill. Other findings:   Labs results:   Recent Labs     07/15/16  0357  HGB  15.3  HCT  45.4  MCV  83.4  WBC  10.6  BUN  22*  CREATININE  0.46  GLUCOSE  133*  CALCIUM  9.0  ,    Culture results:    CLINICAL DATA:  Short of breath  EXAM: CT ANGIOGRAPHY CHEST WITH CONTRAST  TECHNIQUE: Multidetector CT imaging of the chest was performed using the standard protocol during bolus administration of intravenous contrast. Multiplanar CT image reconstructions and MIPs were obtained to evaluate the vascular anatomy.  CONTRAST:  75 cc Isovue 370  COMPARISON:  11/05/2013  FINDINGS: Cardiovascular: There are no filling defects in the pulmonary arterial tree to suggest acute pulmonary thromboembolism.  The heart is mildly enlarged. Three vessel coronary artery calcification.  Atherosclerotic calcifications of the aortic arch are present without evidence of dissection or aneurysm. Great vessels are grossly patent within the confines of the examination.  Mediastinum/Nodes: Borderline enlarged mediastinal nodes are stable. Small hiatal hernia is noted.  Lungs/Pleura: No  pneumothorax or pleural effusion. Patchy ground-glass opacities are scattered throughout the right lung. Minimal ground-glass opacities at the lateral left base. There is bronchial wall thickening within the central lobar airways primarily involving the lower lobes.  Upper Abdomen: Postcholecystectomy. Anterior contour of the liver is slightly nodular. This is stable. No splenomegaly. No obvious varices. No other features of cirrhosis.  Musculoskeletal: No vertebral compression deformity.  Review of the MIP images confirms the above findings.  IMPRESSION: No evidence of acute pulmonary thromboembolism  Patchy ground-glass opacities are scattered throughout the right lung and to a lesser degree at the lateral left base. These findings are most consistent with an inflammatory process such as bronchopneumonia. Initial follow-up by chest CT without contrast is recommended in 3 months to confirm persistence. This recommendation follows the consensus statement: Recommendations for the Management of Subsolid Pulmonary Nodules Detected at CT: A Statement  from the [place] as published in Radiology 2013; 266:304-317.   Electronically Signed   By: Marybelle Killings M.D.   On: 07/14/2016 16:52     Assessment and Plan: Bilateral pneumonia, bronchospasm, hypoxia, no pulmonary embolism. Slowly improving.  -continue present antibiotic regimen -continue present anti bronchospasm regimen -dvt prophylaxis -wean fio2 as tolerated -out patient f/u with chest xrays -following   I have personally obtained a history, examined the patient, evaluated laboratory and imaging results, formulated the assessment and plan and placed orders.  The Patient requires high complexity decision making for assessment and support, frequent evaluation and titration of therapies, application of advanced monitoring technologies and extensive interpretation of multiple databases.   Jaala Bohle,M.D. Pulmonary & Critical care Medicine [facility]

## 2016-07-15 NOTE — Progress Notes (Signed)
[facility] [place] NAME: [name]    MR#:  [id number]  DATE OF BIRTH:  July 28, 1950  SUBJECTIVE:  CHIEF COMPLAINT:  Pts sob is better, still have cough and some secretions.She had seizures 07/12/16 morning and again on 07/13/16 morning.   She was on some seizure meds, stopped taking as she had headaches with that 2 weeks ago.   her daughter was in room. Pt have increased oxygen requirement. Completely alert and oriented today.  REVIEW OF SYSTEMS:   CONSTITUTIONAL: No fever, fatigue or weakness.  EYES: No blurred or double vision.  EARS, NOSE, AND THROAT: No tinnitus or ear pain.  RESPIRATORY: some cough, exertional shortness of breath, denies wheezing or hemoptysis.  CARDIOVASCULAR: No chest pain, orthopnea, edema.  GASTROINTESTINAL: No nausea, vomiting, diarrhea or abdominal pain.  GENITOURINARY: No dysuria, hematuria.  ENDOCRINE: No polyuria, nocturia,  HEMATOLOGY: No anemia, easy bruising or bleeding SKIN: No rash or lesion. MUSCULOSKELETAL: No joint pain or arthritis.   NEUROLOGIC: No tingling, numbness, weakness.  PSYCHIATRY: No anxiety or depression.   DRUG ALLERGIES:   Allergies  Allergen Reactions  . Aspirin   . Celebrex [Celecoxib]   . Ciprofloxacin   . Codeine   . Fosphenytoin   . Levaquin [Levofloxacin In D5w]   . Levofloxacin Other (See Comments)  . Lovastatin   . Penicillins   . Pravastatin   . Sulfa Antibiotics     VITALS:  Blood pressure (!) 159/63, pulse 87, temperature 97.5 F (36.4 C), temperature source Oral, resp. rate 20, height _0  (1.6 m), weight 73.9 kg (163 lb), SpO2 91 %.  PHYSICAL EXAMINATION:  GENERAL:  66 y.o.-year-old patient lying in the bed with no acute distress.  EYES: Pupils equal, round, reactive to light and accommodation. No scleral icterus. Extraocular muscles intact.  HEENT: Head atraumatic, normocephalic. Oropharynx and nasopharynx clear.  NECK:  Supple, no jugular  venous distention. No thyroid enlargement, no tenderness.  LUNGS: Mod breath sounds bilaterally, b/l wheezing, no crepitation. No use of accessory muscles of respiration. CARDIOVASCULAR: S1, S2 normal. No murmurs, rubs, or gallops.  ABDOMEN: Soft, nontender, nondistended. Bowel sounds present. No organomegaly or mass.  EXTREMITIES: No pedal edema, cyanosis, or clubbing.  NEUROLOGIC: pt is alert and oriented. PSYCHIATRIC: no depression/ anxiety.  SKIN: No obvious rash, lesion, or ulcer.   LABORATORY PANEL:   CBC  Recent Labs Lab 07/15/16 0357  WBC 10.6  HGB 15.3  HCT 45.4  PLT 195   ------------------------------------------------------------------------------------------------------------------  Chemistries   Recent Labs Lab 07/10/16 2012  07/15/16 0357  NA 134*  < > 138  K 2.9*  < > 4.6  CL 93*  < > 96*  CO2 30  < > 35*  GLUCOSE 348*  < > 133*  BUN 11  < > 22*  CREATININE 0.69  < > 0.46  CALCIUM 9.2  < > 9.0  AST 53*  --   --   ALT 33  --   --   ALKPHOS 113  --   --   BILITOT 1.1  --   --   < > = values in this interval not displayed. ------------------------------------------------------------------------------------------------------------------  Cardiac Enzymes  Recent Labs Lab 07/10/16 2012  TROPONINI <0.03   ------------------------------------------------------------------------------------------------------------------  RADIOLOGY:  Ct Angio Chest Pe W Or Wo Contrast  Result Date: 07/14/2016 CLINICAL DATA:  Short of breath EXAM: CT ANGIOGRAPHY CHEST WITH CONTRAST TECHNIQUE: Multidetector CT imaging of the chest was performed using the  standard protocol during bolus administration of intravenous contrast. Multiplanar CT image reconstructions and MIPs were obtained to evaluate the vascular anatomy. CONTRAST:  75 cc Isovue 370 COMPARISON:  11/05/2013 FINDINGS: Cardiovascular: There are no filling defects in the pulmonary arterial tree to suggest acute  pulmonary thromboembolism. The heart is mildly enlarged. Three vessel coronary artery calcification. Atherosclerotic calcifications of the aortic arch are present without evidence of dissection or aneurysm. Great vessels are grossly patent within the confines of the examination. Mediastinum/Nodes: Borderline enlarged mediastinal nodes are stable. Small hiatal hernia is noted. Lungs/Pleura: No pneumothorax or pleural effusion. Patchy ground-glass opacities are scattered throughout the right lung. Minimal ground-glass opacities at the lateral left base. There is bronchial wall thickening within the central lobar airways primarily involving the lower lobes. Upper Abdomen: Postcholecystectomy. Anterior contour of the liver is slightly nodular. This is stable. No splenomegaly. No obvious varices. No other features of cirrhosis. Musculoskeletal: No vertebral compression deformity. Review of the MIP images confirms the above findings. IMPRESSION: No evidence of acute pulmonary thromboembolism Patchy ground-glass opacities are scattered throughout the right lung and to a lesser degree at the lateral left base. These findings are most consistent with an inflammatory process such as bronchopneumonia. Initial follow-up by chest CT without contrast is recommended in 3 months to confirm persistence. This recommendation follows the consensus statement: Recommendations for the Management of Subsolid Pulmonary Nodules Detected at CT: A Statement from the [place] as published in Radiology 2013; 266:304-317. Electronically Signed   By: Marybelle Killings M.D.   On: 07/14/2016 16:52    EKG:   Orders placed or performed during the hospital encounter of 04/04/16  . ED EKG  . ED EKG  . EKG 12-Lead  . EKG 12-Lead  . EKG 12-Lead  . EKG 12-Lead  . EKG 12-Lead  . EKG 12-Lead  . EKG    ASSESSMENT AND PLAN:   This is a 66 y.o. female with a history of Home O2 dependent COPD now being admitted with:  1. Acute  exacerbation of COPD, bilateral pneumonia - IV steroids and azithromycin  - Continue Dulera, Singulair - Nebulizers, O2 therapy and expectorants as needed.   * pt have hypoxia today, Xray shows pulmonary edema   acute diastolic CHF-    appreciated echo, give IV lasix.   Got CT chest for PE and get Pulm consult.   CT shows b/l Pneumonia   Added vanc as MRSA screen positive.   Added levaquin- Dr. patient with help of translator about her allergies and she denies any allergy to ciprofloxacin and levofloxacin.  2. Hypokalemia-  resolved with potassium replacement.  3. History of seizure disorder- had seizures here 07/12/16 morning.   She stopped taking seizure meds 2 weeks ago,    Neurology consulted to re-adjust dose of seizure meds.    Dr. Jules Husbands- changed keppra to vimpat, as it was giving her side effect in past.   No complaint of headache or known any seizures today.  4. History of diabetes-hold glipizide, utilize regular insulin sliding scale coverage before meals and at bedtime  5. History of depression-continue Celexa  6. History of CAD- denies any chest paincontinue Plavix  7. History of hypertension- blood pressure is stable.continue Norvasc, enalapril  8. History of GERD-continue Prilosec   All the records are reviewed and case discussed with Care Management/Social Workerr. Management plans discussed with the patien with the help of sign language interpreter Ms. Santiago Glad , family and they are in agreement.  CODE STATUS:  partial code, daughter is the healthcare power of attorney  TOTAL TIME TAKING CARE OF THIS PATIENT: 30 minutes.  Interviewed pt with help of sign Ecologist.  POSSIBLE D/C IN 2 DAYS DAYS, DEPENDING ON CLINICAL CONDITION.  Note: This dictation was prepared with Dragon dictation along with smaller phrase technology. Any transcriptional errors that result from this process are unintentional.   Vaughan Basta M.D on 07/15/2016 at 5:33  PM  Between 7am to 6pm - Pager - [phone] After 6pm go to [email or web address] - password [email or web address] [place]  Office  [phone]  CC: Primary care physician; Donnie Coffin, MD

## 2016-07-15 NOTE — Progress Notes (Signed)
Pharmacy Antibiotic Note  [name] is a 66 y.o. female admitted on 07/10/2016 with pneumonia.  Pharmacy has been consulted for levofloxacin dosing.  Plan: Per RN and MD note, patient does not remember ever taking levofloxacin. Asked RN to monitor after administration and then pharmacy will d/c from allergy list if tolerated.  Begin levofloxacin 750 mg PO q 24 hours tonight  Height: _0  (160 cm) Weight: 163 lb (73.9 kg) IBW/kg (Calculated) : 52.4  Temp (24hrs), Avg:97.8 F (36.6 C), Min:97.5 F (36.4 C), Max:98.3 F (36.8 C)   Recent Labs Lab 07/10/16 2012 07/11/16 0618 07/12/16 0526 07/15/16 0357  WBC 7.3 6.0  --  10.6  CREATININE 0.69 0.54 0.69 0.46    Estimated Creatinine Clearance: 68.4 mL/min (by C-G formula based on SCr of 0.46 mg/dL).    Allergies  Allergen Reactions  . Aspirin   . Celebrex [Celecoxib]   . Ciprofloxacin   . Codeine   . Fosphenytoin   . Levaquin [Levofloxacin In D5w]   . Levofloxacin Other (See Comments)  . Lovastatin   . Penicillins   . Pravastatin   . Sulfa Antibiotics     Antimicrobials this admission: azithromycin 1/8 >> 1/9 vancomycin 1/9 >>  Levofloxacin 1/9 >>  Dose adjustments this admission:  Microbiology results: 1/4 BCx: no growth  UCx:    Sputum:   1/5 MRSA PCR: positive   Thank you for allowing pharmacy to be a part of this patient's care.  Darrow Bussing, PharmD Pharmacy Resident 07/15/2016 5:58 PM

## 2016-07-16 LAB — GLUCOSE, CAPILLARY
Glucose-Capillary: 125 mg/dL — ABNORMAL HIGH (ref 65–99)
Glucose-Capillary: 263 mg/dL — ABNORMAL HIGH (ref 65–99)
Glucose-Capillary: 362 mg/dL — ABNORMAL HIGH (ref 65–99)
Glucose-Capillary: 50 mg/dL — ABNORMAL LOW (ref 65–99)
Glucose-Capillary: 177 mg/dL — ABNORMAL HIGH (ref 65–99)

## 2016-07-16 NOTE — Progress Notes (Signed)
Inpatient Diabetes Program Recommendations  AACE/ADA: New Consensus Statement on Inpatient Glycemic Control (2015)  Target Ranges:  Prepandial:   less than 140 mg/dL      Peak postprandial:   less than 180 mg/dL (1-2 hours)      Critically ill patients:  140 - 180 mg/dL   Results for CYNDIA, DEGRAFF (MRN [id number]) as of 07/16/2016 10:04  Ref. Range 07/15/2016 07:37 07/15/2016 11:40 07/15/2016 16:54 07/15/2016 20:55 07/16/2016 07:41  Glucose-Capillary Latest Ref Range: 65 - 99 mg/dL 128 (H) 274 (H) 301 (H) 83 125 (H)   Review of Glycemic Control  Outpatient Diabetes medications: Glipizide XL 5 mg daily Current orders for Inpatient glycemic control: Novolog 0-20 units TID with meals, Novolog 0-5 units QHS  Inpatient Diabetes Program Recommendations:  Insulin - Meal Coverage: While inpatient and ordered steroids, please consider ordering Novolog 3 units TID with meals for meal coverage.  Thanks, Barnie Alderman, RN, MSN, CDE Diabetes Coordinator Inpatient Diabetes Program [phone] (Team Pager from 8am to 5pm)

## 2016-07-16 NOTE — Evaluation (Signed)
Physical Therapy Evaluation Patient Details Name: [name] MRN: [id number] DOB: 11-03-1950 Today's Date: 07/16/2016   History of Present Illness  Pt is admitted for COPD exacerbation with with B pneumonia. Pt needs sign language to communicate and also reads lips well. Pt with history of asthma, COPD, deaf, HTN, and neuropathy. Hospital stay complicated by seizures 1/6 and 1/7 secondary to hypoxic episode.   Clinical Impression  Pt is a pleasant 66 year old female who was admitted for COPD exacerbation. Cleared to work with pt from MD, family in room to interpret as no sign language interpreter available. Pt performs bed mobility with supervision, transfers with cga, and ambulation with cga and RW. Pt performed all mobility on 3L of O2 with sats decreasing with limited exertion. Recommend continue use of RW for improved endurance. She typically is independent prior to admission.  Pt demonstrates deficits with strength/mobility/endurance. Would benefit from skilled PT to address above deficits and promote optimal return to PLOF. Recommend transition to [place] upon discharge from acute hospitalization.       Follow Up Recommendations Home health PT    Equipment Recommendations       Recommendations for Other Services       Precautions / Restrictions Precautions Precautions: Fall Restrictions Weight Bearing Restrictions: No      Mobility  Bed Mobility Overal bed mobility: Needs Assistance Bed Mobility: Supine to Sit     Supine to sit: Supervision     General bed mobility comments: safe technique performed with pt able to sit at EOB without assistance  Transfers Overall transfer level: Needs assistance Equipment used: Rolling walker (2 wheeled) Transfers: Sit to/from Stand Sit to Stand: Min guard         General transfer comment: assist for upright posture. All mobility performed on 3L of O2. O2 at 90% pre  Ambulation/Gait Ambulation/Gait assistance: Min  guard Ambulation Distance (Feet): 15 Feet Assistive device: Rolling walker (2 wheeled) Gait Pattern/deviations: Step-through pattern     General Gait Details: ambulated with slow gait speed and safe use of RW. Reciprocal gait pattern noted. Pt fatigues with limited endurance. O2 sats decrease to 85% with exertion. Cues given for pursed lip breathing.   Stairs            Wheelchair Mobility    Modified Rankin (Stroke Patients Only)       Balance Overall balance assessment: Needs assistance Sitting-balance support: Feet supported Sitting balance-Leahy Scale: Good     Standing balance support: Bilateral upper extremity supported Standing balance-Leahy Scale: Good                               Pertinent Vitals/Pain Pain Assessment: No/denies pain    Home Living Family/patient expects to be discharged to:: Private residence Living Arrangements: Alone Available Help at Discharge: Family (near by) Type of Home: House Home Access: Level entry     Home Layout: One level Home Equipment: Environmental consultant - 2 wheels      Prior Function Level of Independence: Independent               Hand Dominance        Extremity/Trunk Assessment   Upper Extremity Assessment Upper Extremity Assessment: Generalized weakness (B UE grossly 4+/5)    Lower Extremity Assessment Lower Extremity Assessment: Overall WFL for tasks assessed       Communication   Communication: Deaf  Cognition Arousal/Alertness: Awake/alert Behavior During Therapy: [facility]  for tasks assessed/performed Overall Cognitive Status: Within Functional Limits for tasks assessed                      General Comments      Exercises     Assessment/Plan    PT Assessment Patient needs continued PT services  PT Problem List Decreased strength;Decreased activity tolerance;Decreased balance;Decreased mobility;Decreased knowledge of use of DME;Decreased knowledge of precautions;Cardiopulmonary  status limiting activity          PT Treatment Interventions DME instruction;Gait training    PT Goals (Current goals can be found in the Care Plan section)  Acute Rehab PT Goals Patient Stated Goal: to get to go home PT Goal Formulation: With patient Time For Goal Achievement: 07/30/16 Potential to Achieve Goals: Good    Frequency Min 2X/week   Barriers to discharge        Co-evaluation               End of Session Equipment Utilized During Treatment: Gait belt;Oxygen Activity Tolerance: Treatment limited secondary to medical complications (Comment) Patient left: in chair;with chair alarm set;with family/visitor present Nurse Communication: Mobility status         Time: 1021-1173 PT Time Calculation (min) (ACUTE ONLY): 24 min   Charges:   PT Evaluation $PT Eval Moderate Complexity: 1 Procedure     PT G Codes:        Roselinda Bahena 07-Aug-2016, 1:32 PM  Greggory Stallion, PT, DPT [phone]

## 2016-07-16 NOTE — Progress Notes (Signed)
[facility] [place] NAME: [name]    MR#:  [id number]  DATE OF BIRTH:  Nov 07, 1950  SUBJECTIVE:  CHIEF COMPLAINT:  Pts sob is better, still have cough and some secretions.She had seizures 07/12/16 morning and again on 07/13/16 morning.   She was on some seizure meds, stopped taking as she had headaches with that 2 weeks ago.  now no more seizures on vimpat.  her daughter was in room. Pt have increased oxygen requirement. Found pneumonia on CT chest. Completely alert and oriented today. Still need 4 ltr oxygen, and desaturates with minimal exertion.  REVIEW OF SYSTEMS:   CONSTITUTIONAL: No fever, fatigue or weakness.  EYES: No blurred or double vision.  EARS, NOSE, AND THROAT: No tinnitus or ear pain.  RESPIRATORY: some cough, exertional shortness of breath, denies wheezing or hemoptysis.  CARDIOVASCULAR: No chest pain, orthopnea, edema.  GASTROINTESTINAL: No nausea, vomiting, diarrhea or abdominal pain.  GENITOURINARY: No dysuria, hematuria.  ENDOCRINE: No polyuria, nocturia,  HEMATOLOGY: No anemia, easy bruising or bleeding SKIN: No rash or lesion. MUSCULOSKELETAL: No joint pain or arthritis.   NEUROLOGIC: No tingling, numbness, weakness.  PSYCHIATRY: No anxiety or depression.   DRUG ALLERGIES:   Allergies  Allergen Reactions  . Aspirin   . Celebrex [Celecoxib]   . Ciprofloxacin   . Codeine   . Fosphenytoin   . Levaquin [Levofloxacin In D5w]   . Levofloxacin Other (See Comments)  . Lovastatin   . Penicillins   . Pravastatin   . Sulfa Antibiotics     VITALS:  Blood pressure (!) 143/62, pulse 85, temperature 98 F (36.7 C), temperature source Oral, resp. rate 18, height _0  (1.6 m), weight 73.9 kg (163 lb), SpO2 94 %.  PHYSICAL EXAMINATION:  GENERAL:  66 y.o.-year-old patient lying in the bed with no acute distress.  EYES: Pupils equal, round, reactive to light and accommodation. No scleral icterus. Extraocular  muscles intact.  HEENT: Head atraumatic, normocephalic. Oropharynx and nasopharynx clear.  NECK:  Supple, no jugular venous distention. No thyroid enlargement, no tenderness.  LUNGS: Mod breath sounds bilaterally, b/l wheezing, no crepitation. No use of accessory muscles of respiration. CARDIOVASCULAR: S1, S2 normal. No murmurs, rubs, or gallops.  ABDOMEN: Soft, nontender, nondistended. Bowel sounds present. No organomegaly or mass.  EXTREMITIES: No pedal edema, cyanosis, or clubbing.  NEUROLOGIC: pt is alert and oriented. Follows commands, moves all 4 limbs, power 5/5. Gait not checked. PSYCHIATRIC: no depression/ anxiety.  SKIN: No obvious rash, lesion, or ulcer.   LABORATORY PANEL:   CBC  Recent Labs Lab 07/15/16 0357  WBC 10.6  HGB 15.3  HCT 45.4  PLT 195   ------------------------------------------------------------------------------------------------------------------  Chemistries   Recent Labs Lab 07/10/16 2012  07/15/16 0357  NA 134*  < > 138  K 2.9*  < > 4.6  CL 93*  < > 96*  CO2 30  < > 35*  GLUCOSE 348*  < > 133*  BUN 11  < > 22*  CREATININE 0.69  < > 0.46  CALCIUM 9.2  < > 9.0  AST 53*  --   --   ALT 33  --   --   ALKPHOS 113  --   --   BILITOT 1.1  --   --   < > = values in this interval not displayed. ------------------------------------------------------------------------------------------------------------------  Cardiac Enzymes  Recent Labs Lab 07/10/16 2012  TROPONINI <0.03   ------------------------------------------------------------------------------------------------------------------  RADIOLOGY:  No results found.  EKG:   Orders placed or performed during the hospital encounter of 04/04/16  . ED EKG  . ED EKG  . EKG 12-Lead  . EKG 12-Lead  . EKG 12-Lead  . EKG 12-Lead  . EKG 12-Lead  . EKG 12-Lead  . EKG    ASSESSMENT AND PLAN:   This is a 66 y.o. female with a history of Home O2 dependent COPD now being admitted  with:  1. Acute exacerbation of COPD, bilateral pneumonia - IV steroids and azithromycin  - Continue Dulera, Singulair - Nebulizers, O2 therapy and expectorants as needed.    Changed to oral steroids.  * pt have hypoxia today, Xray shows pulmonary edema   acute diastolic CHF-    appreciated echo, give IV lasix.   Got CT chest for PE and get Pulm consult.   CT shows b/l Pneumonia   Added vanc as MRSA screen positive.   Added levaquin-  Talked to patient with help of translator about her allergies and she denies any allergy to ciprofloxacin and levofloxacin.  Still requires 4 ltr oxygen and have hypoxia with moving bed to chair.  2. Hypokalemia-  resolved with potassium replacement.  3. History of seizure disorder- had seizures here 07/12/16 morning.   She stopped taking seizure meds 2 weeks ago,     Dr. Jules Husbands- changed keppra to vimpat, as it was giving her side effect in past.   No complaint of headache or known any seizures now.  4. History of diabetes-hold glipizide, utilize regular insulin sliding scale coverage before meals and at bedtime  5. History of depression-continue Celexa  6. History of CAD- denies any chest paincontinue Plavix  7. History of hypertension- blood pressure is stable.continue Norvasc, enalapril  8. History of GERD-continue Prilosec   All the records are reviewed and case discussed with Care Management/Social Workerr. Management plans discussed with the patien with the help of sign language interpreter Ms. Santiago Glad , family and they are in agreement.  CODE STATUS: partial code, daughter is the healthcare power of attorney  TOTAL TIME TAKING CARE OF THIS PATIENT: 30 minutes.  Interviewed pt with help of daughter.  POSSIBLE D/C IN 1-2 DAYS DAYS, DEPENDING ON CLINICAL CONDITION.  Note: This dictation was prepared with Dragon dictation along with smaller phrase technology. Any transcriptional errors that result from this process are  unintentional.   Vaughan Basta M.D on 07/16/2016 at 8:53 PM  Between 7am to 6pm - Pager - [phone] After 6pm go to [email or web address] - password [email or web address] [facility]  Office  [phone]  CC: Primary care physician; Donnie Coffin, MD

## 2016-07-17 LAB — GLUCOSE, CAPILLARY: GLUCOSE-CAPILLARY: 138 mg/dL — AB (ref 65–99)

## 2016-07-17 LAB — CREATININE, SERUM
CREATININE: 0.61 mg/dL (ref 0.44–1.00)
GFR calc non Af Amer: >60 mL/min (ref >60)

## 2016-07-17 MED ORDER — ALBUTEROL SULFATE (2.5 MG/3ML) 0.083% IN NEBU: 2.5000 mg | INHALATION_SOLUTION | RESPIRATORY_TRACT | 2 refills | Status: DC | PRN

## 2016-07-17 MED ORDER — MUPIROCIN 2 % EX OINT: 1.0000 "application " | TOPICAL_OINTMENT | Freq: Two times a day (BID) | CUTANEOUS | 0 refills | Status: DC

## 2016-07-17 MED ORDER — ENALAPRIL MALEATE 20 MG PO TABS: 20.0000 mg | ORAL_TABLET | Freq: Every day | ORAL | 0 refills | Status: DC

## 2016-07-17 MED ORDER — CLOPIDOGREL BISULFATE 75 MG PO TABS: 75.0000 mg | ORAL_TABLET | Freq: Every day | ORAL | 1 refills | Status: DC

## 2016-07-17 MED ORDER — FUROSEMIDE 40 MG PO TABS: 40.0000 mg | ORAL_TABLET | Freq: Every day | ORAL | 0 refills | Status: DC

## 2016-07-17 MED ORDER — DEXTROMETHORPHAN POLISTIREX ER 30 MG/5ML PO SUER: 30.0000 mg | Freq: Two times a day (BID) | ORAL | 0 refills | Status: DC

## 2016-07-17 MED ORDER — GUAIFENESIN ER 600 MG PO TB12: 600.0000 mg | ORAL_TABLET | Freq: Two times a day (BID) | ORAL | 0 refills | Status: DC

## 2016-07-17 MED ORDER — LACOSAMIDE 50 MG PO TABS
50.0000 mg | ORAL_TABLET | Freq: Two times a day (BID) | ORAL | 0 refills | Status: DC
Start: 2016-07-17 — End: 2017-03-30

## 2016-07-17 MED ORDER — CITALOPRAM HYDROBROMIDE 20 MG PO TABS: 20.0000 mg | ORAL_TABLET | Freq: Every day | ORAL | 0 refills | Status: DC

## 2016-07-17 MED ORDER — AMLODIPINE BESYLATE 10 MG PO TABS: 10.0000 mg | ORAL_TABLET | Freq: Every day | ORAL | 1 refills | Status: DC

## 2016-07-17 MED ORDER — POTASSIUM CHLORIDE ER 10 MEQ PO TBCR: 10.0000 meq | EXTENDED_RELEASE_TABLET | Freq: Every day | ORAL | 0 refills | Status: DC

## 2016-07-17 MED ORDER — LEVOFLOXACIN 750 MG PO TABS: 750.0000 mg | ORAL_TABLET | Freq: Every day | ORAL | 0 refills | Status: AC

## 2016-07-17 MED ORDER — MOMETASONE FURO-FORMOTEROL FUM 200-5 MCG/ACT IN AERO: 2.0000 | INHALATION_SPRAY | Freq: Two times a day (BID) | RESPIRATORY_TRACT | 1 refills | Status: DC

## 2016-07-17 MED ORDER — IPRATROPIUM-ALBUTEROL 0.5-2.5 (3) MG/3ML IN SOLN: 3.0000 mL | Freq: Four times a day (QID) | RESPIRATORY_TRACT | 0 refills | Status: DC | PRN

## 2016-07-17 MED ORDER — PREDNISONE 10 MG PO TABS: ORAL_TABLET | ORAL | 0 refills | Status: DC

## 2016-07-17 MED ORDER — DOXYCYCLINE HYCLATE 100 MG PO TABS: 100.0000 mg | ORAL_TABLET | Freq: Two times a day (BID) | ORAL | 0 refills | Status: AC

## 2016-07-17 NOTE — Discharge Summary (Signed)
[facility] [place] NAME: [name]    MR#:  [id number]  DATE OF BIRTH:  February 11, 1951  DATE OF ADMISSION:  07/10/2016 ADMITTING PHYSICIAN: Harvie Bridge, DO  DATE OF DISCHARGE: 07/17/2016  PRIMARY CARE PHYSICIAN: Tomasa Hose A, MD    ADMISSION DIAGNOSIS:  Shortness of breath [R06.02] COPD exacerbation (HCC) [J44.1]  DISCHARGE DIAGNOSIS:  Active Problems:   COPD exacerbation (HCC)   Bilateral pneumonia   hypoxia  SECONDARY DIAGNOSIS:   Past Medical History:  Diagnosis Date  . Asthma   . Cirrhosis, non-alcoholic ([place])   . COPD (chronic obstructive pulmonary disease) ([place])   . Deaf   . Depression   . Diabetes mellitus without complication ([place])   . GERD (gastroesophageal reflux disease)   . Heart murmur   . Hepatitis   . Hypertension   . Lymph node disorder    arm  . Neuropathy ([place])   . On home oxygen therapy    hs  . Orthopnea   . RLS (restless legs syndrome)   . Seizures ([place])   . Shortness of breath dyspnea   . Sleep apnea   . Stroke [facility])    [place] COURSE:   1. Acute exacerbation of COPD, bilateral pneumonia - IV steroids and azithromycin  - Continue Dulera, Singulair - Nebulizers, O2 therapy and expectorants as needed.    Changed to oral steroids.  * pt have hypoxia today, Xray shows pulmonary edema   acute diastolic CHF-    appreciated echo, give IV lasix.   Got CT chest for PE and get Pulm consult.   CT shows b/l Pneumonia   Added vanc as MRSA screen positive.   Added levaquin-  Talked to patient with help of translator about her allergies and she denies any allergy to ciprofloxacin and levofloxacin.  Felt much better.  2. Hypokalemia-  resolved with potassium replacement.  3. History of seizure disorder- had seizures here 07/12/16 morning.   She stopped taking seizure meds 2 weeks ago,     Dr. Jules Husbands- changed keppra to vimpat, as it was giving her side effect in past.    No complaint of headache or known any seizures now.  4. History of diabetes-hold glipizide, utilize regular insulin sliding scale coverage before meals and at bedtime  5. History of depression-continue Celexa  6. History of CAD- denies any chest paincontinue Plavix  7. History of hypertension- blood pressure is stable.continue Norvasc, enalapril  8. History of GERD-continue Prilosec   DISCHARGE CONDITIONS:   Stable.  CONSULTS OBTAINED:  Treatment Team:  Leotis Pain, MD Erby Pian, MD  DRUG ALLERGIES:   Allergies  Allergen Reactions  . Aspirin   . Celebrex [Celecoxib]   . Ciprofloxacin   . Codeine   . Fosphenytoin   . Levaquin [Levofloxacin In D5w]   . Levofloxacin Other (See Comments)  . Lovastatin   . Penicillins   . Pravastatin   . Sulfa Antibiotics     DISCHARGE MEDICATIONS:   Current Discharge Medication List    START taking these medications   Details  dextromethorphan (DELSYM) 30 MG/5ML liquid Take 5 mLs (30 mg total) by mouth 2 (two) times daily. Qty: 89 mL, Refills: 0    furosemide (LASIX) 40 MG tablet Take 1 tablet (40 mg total) by mouth daily. Qty: 30 tablet, Refills: 0    guaiFENesin (MUCINEX) 600 MG 12 hr tablet Take 1 tablet (600 mg total) by mouth 2 (  two) times daily. Qty: 10 tablet, Refills: 0    lacosamide (VIMPAT) 50 MG TABS tablet Take 1 tablet (50 mg total) by mouth 2 (two) times daily. Qty: 60 tablet, Refills: 0    levofloxacin (LEVAQUIN) 750 MG tablet Take 1 tablet (750 mg total) by mouth daily. Qty: 6 tablet, Refills: 0    mupirocin ointment (BACTROBAN) 2 % Place 1 application into the nose 2 (two) times daily. Qty: 22 g, Refills: 0    potassium chloride (K-DUR) 10 MEQ tablet Take 1 tablet (10 mEq total) by mouth daily. Qty: 30 tablet, Refills: 0      CONTINUE these medications which have CHANGED   Details  albuterol (PROVENTIL) (2.5 MG/3ML) 0.083% nebulizer solution Take 3 mLs (2.5 mg total) by nebulization  every 4 (four) hours as needed for wheezing or shortness of breath. Qty: 90 mL, Refills: 2    amLODipine (NORVASC) 10 MG tablet Take 1 tablet (10 mg total) by mouth daily. Qty: 30 tablet, Refills: 1    citalopram (CELEXA) 20 MG tablet Take 1 tablet (20 mg total) by mouth daily. Qty: 30 tablet, Refills: 0    clopidogrel (PLAVIX) 75 MG tablet Take 1 tablet (75 mg total) by mouth daily. Qty: 30 tablet, Refills: 1    doxycycline (VIBRA-TABS) 100 MG tablet Take 1 tablet (100 mg total) by mouth every 12 (twelve) hours. Qty: 12 tablet, Refills: 0    enalapril (VASOTEC) 20 MG tablet Take 1 tablet (20 mg total) by mouth daily. Qty: 30 tablet, Refills: 0    ipratropium-albuterol (DUONEB) 0.5-2.5 (3) MG/3ML SOLN Take 3 mLs by nebulization every 6 (six) hours as needed. Qty: 360 mL, Refills: 0    mometasone-formoterol (DULERA) 200-5 MCG/ACT AERO Inhale 2 puffs into the lungs 2 (two) times daily. Qty: 1 Inhaler, Refills: 1    predniSONE (DELTASONE) 10 MG tablet Take 3 tablet on 07/18/16, 2 tab on 07/19/16, 1 tablet on 07/21/15. Qty: 6 tablet, Refills: 0      CONTINUE these medications which have NOT CHANGED   Details  albuterol (PROVENTIL HFA;VENTOLIN HFA) 108 (90 BASE) MCG/ACT inhaler Inhale 2 puffs into the lungs every 4 (four) hours as needed for wheezing or shortness of breath.    carboxymethylcellulose (REFRESH TEARS) 0.5 % SOLN Place 1 drop into both eyes daily as needed.    diphenhydrAMINE (SOMINEX) 25 MG tablet Take 25 mg by mouth at bedtime as needed for sleep.    gabapentin (NEURONTIN) 300 MG capsule Take 1 capsule by mouth 3 (three) times daily. Refills: 1    GLIPIZIDE XL 5 MG 24 hr tablet Take 1 tablet by mouth daily. Refills: 1    HYDROcodone-homatropine (HYCODAN) 5-1.5 MG/5ML syrup Take 5 mLs by mouth every 6 (six) hours as needed for cough.    montelukast (SINGULAIR) 10 MG tablet Take 1 tablet by mouth at bedtime. Refills: 4    naproxen (NAPROSYN) 500 MG tablet Take 500  mg by mouth 2 (two) times daily with a meal.    omeprazole (PRILOSEC) 20 MG capsule Take 20 mg by mouth daily.    rOPINIRole (REQUIP) 0.25 MG tablet Take 1 tablet (0.25 mg total) by mouth 3 (three) times daily. Qty: 90 tablet, Refills: 2    EPINEPHrine 0.3 mg/0.3 mL IJ SOAJ injection Inject into the muscle once.      STOP taking these medications     levETIRAcetam (KEPPRA) 1000 MG tablet      levETIRAcetam (KEPPRA) 250 MG tablet  DISCHARGE INSTRUCTIONS:    Follow with Pulmonologist and neurologist in next 1-2 weeks.  If you experience worsening of your admission symptoms, develop shortness of breath, life threatening emergency, suicidal or homicidal thoughts you must seek medical attention immediately by calling 911 or calling your MD immediately  if symptoms less severe.  You Must read complete instructions/literature along with all the possible adverse reactions/side effects for all the Medicines you take and that have been prescribed to you. Take any new Medicines after you have completely understood and accept all the possible adverse reactions/side effects.   Please note  You were cared for by a hospitalist during your hospital stay. If you have any questions about your discharge medications or the care you received while you were in the hospital after you are discharged, you can call the unit and asked to speak with the hospitalist on call if the hospitalist that took care of you is not available. Once you are discharged, your primary care physician will handle any further medical issues. Please note that NO REFILLS for any discharge medications will be authorized once you are discharged, as it is imperative that you return to your primary care physician (or establish a relationship with a primary care physician if you do not have one) for your aftercare needs so that they can reassess your need for medications and monitor your lab values.    Today   CHIEF  COMPLAINT:   Chief Complaint  Patient presents with  . Shortness of Breath    HISTORY OF PRESENT ILLNESS:  Katherina Wimer  is a 66 y.o. female with a known history of COPD, home O2 dependent presents to the emergency department for evaluation of SOB.  Patient was in a usual state of health until Thanksgiving when her baseline SOB started to become worse.  She has seen her physicians several times since then, most recently yesterday. She noticed worsening shortness of breath, dyspnea on exertion cough productive of clear to white sputum over the past several days which has been refractory to her albuterol.   Otherwise there has been no change in status. Patient has been taking medication as prescribed and there has been no recent change in medication or diet.  There has been no recent illness, Hospital is a, travel or sick contacts.    Patient denies fevers/chills, weakness, dizziness, chest pain, N/V/C/D, abdominal pain, dysuria/frequency, changes in mental status.   VITAL SIGNS:  Blood pressure (!) 159/62, pulse 65, temperature 98.4 F (36.9 C), temperature source Oral, resp. rate 20, height _0  (1.6 m), weight 73.9 kg (163 lb), SpO2 92 %.  I/O:  No intake or output data in the 24 hours ending 07/17/16 1030  PHYSICAL EXAMINATION:   GENERAL:  66 y.o.-year-old patient lying in the bed with no acute distress.  EYES: Pupils equal, round, reactive to light and accommodation. No scleral icterus. Extraocular muscles intact.  HEENT: Head atraumatic, normocephalic. Oropharynx and nasopharynx clear.  NECK:  Supple, no jugular venous distention. No thyroid enlargement, no tenderness.  LUNGS: Mod breath sounds bilaterally, b/l wheezing, no crepitation. No use of accessory muscles of respiration. CARDIOVASCULAR: S1, S2 normal. No murmurs, rubs, or gallops.  ABDOMEN: Soft, nontender, nondistended. Bowel sounds present. No organomegaly or mass.  EXTREMITIES: No pedal edema, cyanosis, or clubbing.   NEUROLOGIC: pt is alert and oriented. Follows commands, moves all 4 limbs, power 5/5. Gait not checked. PSYCHIATRIC: no depression/ anxiety.  SKIN: No obvious rash, lesion, or ulcer.   DATA REVIEW:  CBC  Recent Labs Lab 07/15/16 0357  WBC 10.6  HGB 15.3  HCT 45.4  PLT 195    Chemistries   Recent Labs Lab 07/10/16 2012  07/15/16 0357 07/17/16 0458  NA 134*  < > 138  --   K 2.9*  < > 4.6  --   CL 93*  < > 96*  --   CO2 30  < > 35*  --   GLUCOSE 348*  < > 133*  --   BUN 11  < > 22*  --   CREATININE 0.69  < > 0.46 0.61  CALCIUM 9.2  < > 9.0  --   AST 53*  --   --   --   ALT 33  --   --   --   ALKPHOS 113  --   --   --   BILITOT 1.1  --   --   --   < > = values in this interval not displayed.  Cardiac Enzymes  Recent Labs Lab 07/10/16 2012  TROPONINI <0.03    Microbiology Results  Results for orders placed or performed during the hospital encounter of 07/10/16  Blood culture (routine x 2)     Status: None   Collection Time: 07/10/16  8:12 PM  Result Value Ref Range Status   Specimen Description BLOOD RIGHT AC  Final   Special Requests   Final    BOTTLES DRAWN AEROBIC AND ANAEROBIC AER 7ML ANA 4ML   Culture NO GROWTH 5 DAYS  Final   Report Status 07/15/2016 FINAL  Final  Blood culture (routine x 2)     Status: None   Collection Time: 07/10/16  8:12 PM  Result Value Ref Range Status   Specimen Description BLOOD RIGHT FA  Final   Special Requests   Final    BOTTLES DRAWN AEROBIC AND ANAEROBIC AER 9ML ANA 12ML   Culture NO GROWTH 5 DAYS  Final   Report Status 07/15/2016 FINAL  Final  MRSA PCR Screening     Status: Abnormal   Collection Time: 07/11/16  7:37 AM  Result Value Ref Range Status   MRSA by PCR POSITIVE (A) NEGATIVE Final    Comment:        The GeneXpert MRSA Assay (FDA approved for NASAL specimens only), is one component of a comprehensive MRSA colonization surveillance program. It is not intended to diagnose MRSA infection nor to guide  or monitor treatment for MRSA infections. RESULT CALLED TO, READ BACK BY AND VERIFIED WITH: DANA JUGGINS ON 07/11/16 AT 0921 MNS     RADIOLOGY:  No results found.  EKG:   Orders placed or performed during the hospital encounter of 04/04/16  . ED EKG  . ED EKG  . EKG 12-Lead  . EKG 12-Lead  . EKG 12-Lead  . EKG 12-Lead  . EKG 12-Lead  . EKG 12-Lead  . EKG      Management plans discussed with the patient, family and they are in agreement.  CODE STATUS:     Code Status Orders        Start     Ordered   07/11/16 1131  Limited resuscitation (code)  Continuous    Question Answer Comment  In the event of cardiac or respiratory ARREST: Initiate Code Blue, Call Rapid Response Yes   In the event of cardiac or respiratory ARREST: Perform CPR Yes   In the event of cardiac or respiratory ARREST: Perform Intubation/Mechanical Ventilation No  In the event of cardiac or respiratory ARREST: Use NIPPV/BiPAp only if indicated Yes   In the event of cardiac or respiratory ARREST: Administer ACLS medications if indicated Yes   In the event of cardiac or respiratory ARREST: Perform Defibrillation or Cardioversion if indicated Yes      07/11/16 1131    Code Status History    Date Active Date Inactive Code Status Order ID Comments User Context   07/11/2016  2:37 AM 07/11/2016 11:31 AM Full Code [id number]  Harvie Bridge, DO Inpatient   03/22/2016 10:44 PM 03/24/2016  7:12 PM Partial Code [id number]  Toy Baker, MD Inpatient   03/29/2015 11:30 AM 03/31/2015  2:21 PM Partial Code [id number]  Loletha Grayer, MD ED      TOTAL TIME TAKING CARE OF THIS PATIENT: 35 minutes.    Vaughan Basta M.D on 07/17/2016 at 10:30 AM  Between 7am to 6pm - Pager - [phone]  After 6pm go to [email or web address] - password [email or web address] [facility]  Office  [phone]  CC: Primary care physician; Donnie Coffin, MD   Note: This dictation was prepared with Dragon dictation  along with smaller phrase technology. Any transcriptional errors that result from this process are unintentional.

## 2016-07-17 NOTE — Progress Notes (Signed)
Inpatient Diabetes Program Recommendations  AACE/ADA: New Consensus Statement on Inpatient Glycemic Control (2015)  Target Ranges:  Prepandial:   less than 140 mg/dL      Peak postprandial:   less than 180 mg/dL (1-2 hours)      Critically ill patients:  140 - 180 mg/dL    Results for [name], [name] (MRN [id number]) as of 07/17/2016 07:55  Ref. Range 07/16/2016 07:41 07/16/2016 11:42 07/16/2016 16:53 07/16/2016 21:52 07/16/2016 23:12 07/17/2016 07:28  Glucose-Capillary Latest Ref Range: 65 - 99 mg/dL 125 (H)  Novolog 3 units 263 (H)  Novolog 11 units 362 (H)  Novolog 20 units 50 (L) 177 (H) 138 (H)   Review of Glycemic Control  Outpatient Diabetes medications: Glipizide XL 5 mg daily Current orders for Inpatient glycemic control: Novolog 0-20 units TID with meals, Novolog 0-5 units QHS  Inpatient Diabetes Program Recommendations:  Correction (SSI): Please consider decreasing Novolog correction scale to Moderate (0-15 units) TID with meals. Insulin - Meal Coverage: While inpatient and ordered steroids, please consider ordering Novolog 4 units TID with meals for meal coverage.  Thanks, Barnie Alderman, RN, MSN, CDE Diabetes Coordinator Inpatient Diabetes Program [phone] (Team Pager from 8am to 5pm)

## 2016-07-17 NOTE — Care Management (Signed)
Physical therapy evaluation completed. Recommending home with home health and physical therapy. Spoke with daughter, Kenney Houseman. Leonard. Will update Floydene Flock, [place] representative updated. Discharge to home today per Dr. Anselm Jungling. Shelbie Ammons RN MSN CCM Care Management

## 2016-07-27 DIAGNOSIS — J189 Pneumonia, unspecified organism: Secondary | ICD-10-CM | POA: Diagnosis not present

## 2016-07-27 DIAGNOSIS — E1140 Type 2 diabetes mellitus with diabetic neuropathy, unspecified: Secondary | ICD-10-CM | POA: Diagnosis not present

## 2016-07-27 DIAGNOSIS — F329 Major depressive disorder, single episode, unspecified: Secondary | ICD-10-CM | POA: Diagnosis not present

## 2016-07-27 DIAGNOSIS — F17210 Nicotine dependence, cigarettes, uncomplicated: Secondary | ICD-10-CM | POA: Diagnosis not present

## 2016-07-27 DIAGNOSIS — I2510 Atherosclerotic heart disease of native coronary artery without angina pectoris: Secondary | ICD-10-CM | POA: Diagnosis not present

## 2016-07-27 DIAGNOSIS — J441 Chronic obstructive pulmonary disease with (acute) exacerbation: Secondary | ICD-10-CM | POA: Diagnosis not present

## 2016-07-27 DIAGNOSIS — G40909 Epilepsy, unspecified, not intractable, without status epilepticus: Secondary | ICD-10-CM | POA: Diagnosis not present

## 2016-07-27 DIAGNOSIS — J440 Chronic obstructive pulmonary disease with acute lower respiratory infection: Secondary | ICD-10-CM | POA: Diagnosis not present

## 2016-07-27 DIAGNOSIS — I10 Essential (primary) hypertension: Secondary | ICD-10-CM | POA: Diagnosis not present

## 2016-07-28 DIAGNOSIS — R002 Palpitations: Secondary | ICD-10-CM | POA: Diagnosis not present

## 2016-07-28 DIAGNOSIS — I6990 Unspecified sequelae of unspecified cerebrovascular disease: Secondary | ICD-10-CM | POA: Diagnosis not present

## 2016-07-28 DIAGNOSIS — J449 Chronic obstructive pulmonary disease, unspecified: Secondary | ICD-10-CM | POA: Diagnosis not present

## 2016-07-28 DIAGNOSIS — K210 Gastro-esophageal reflux disease with esophagitis: Secondary | ICD-10-CM | POA: Diagnosis not present

## 2016-07-29 DIAGNOSIS — F329 Major depressive disorder, single episode, unspecified: Secondary | ICD-10-CM | POA: Diagnosis not present

## 2016-07-29 DIAGNOSIS — I10 Essential (primary) hypertension: Secondary | ICD-10-CM | POA: Diagnosis not present

## 2016-07-29 DIAGNOSIS — E1140 Type 2 diabetes mellitus with diabetic neuropathy, unspecified: Secondary | ICD-10-CM | POA: Diagnosis not present

## 2016-07-29 DIAGNOSIS — G40909 Epilepsy, unspecified, not intractable, without status epilepticus: Secondary | ICD-10-CM | POA: Diagnosis not present

## 2016-07-29 DIAGNOSIS — F17210 Nicotine dependence, cigarettes, uncomplicated: Secondary | ICD-10-CM | POA: Diagnosis not present

## 2016-07-29 DIAGNOSIS — J440 Chronic obstructive pulmonary disease with acute lower respiratory infection: Secondary | ICD-10-CM | POA: Diagnosis not present

## 2016-07-29 DIAGNOSIS — J441 Chronic obstructive pulmonary disease with (acute) exacerbation: Secondary | ICD-10-CM | POA: Diagnosis not present

## 2016-07-29 DIAGNOSIS — J189 Pneumonia, unspecified organism: Secondary | ICD-10-CM | POA: Diagnosis not present

## 2016-07-29 DIAGNOSIS — R05 Cough: Secondary | ICD-10-CM | POA: Diagnosis not present

## 2016-07-29 DIAGNOSIS — I2510 Atherosclerotic heart disease of native coronary artery without angina pectoris: Secondary | ICD-10-CM | POA: Diagnosis not present

## 2016-07-29 DIAGNOSIS — J439 Emphysema, unspecified: Secondary | ICD-10-CM | POA: Diagnosis not present

## 2016-07-29 DIAGNOSIS — G4734 Idiopathic sleep related nonobstructive alveolar hypoventilation: Secondary | ICD-10-CM | POA: Diagnosis not present

## 2016-07-30 DIAGNOSIS — F329 Major depressive disorder, single episode, unspecified: Secondary | ICD-10-CM | POA: Diagnosis not present

## 2016-07-30 DIAGNOSIS — I10 Essential (primary) hypertension: Secondary | ICD-10-CM | POA: Diagnosis not present

## 2016-07-30 DIAGNOSIS — J189 Pneumonia, unspecified organism: Secondary | ICD-10-CM | POA: Diagnosis not present

## 2016-07-30 DIAGNOSIS — J441 Chronic obstructive pulmonary disease with (acute) exacerbation: Secondary | ICD-10-CM | POA: Diagnosis not present

## 2016-07-30 DIAGNOSIS — F17210 Nicotine dependence, cigarettes, uncomplicated: Secondary | ICD-10-CM | POA: Diagnosis not present

## 2016-07-30 DIAGNOSIS — G40909 Epilepsy, unspecified, not intractable, without status epilepticus: Secondary | ICD-10-CM | POA: Diagnosis not present

## 2016-07-30 DIAGNOSIS — E1140 Type 2 diabetes mellitus with diabetic neuropathy, unspecified: Secondary | ICD-10-CM | POA: Diagnosis not present

## 2016-07-30 DIAGNOSIS — J440 Chronic obstructive pulmonary disease with acute lower respiratory infection: Secondary | ICD-10-CM | POA: Diagnosis not present

## 2016-07-30 DIAGNOSIS — I2510 Atherosclerotic heart disease of native coronary artery without angina pectoris: Secondary | ICD-10-CM | POA: Diagnosis not present

## 2016-08-04 DIAGNOSIS — I2510 Atherosclerotic heart disease of native coronary artery without angina pectoris: Secondary | ICD-10-CM | POA: Diagnosis not present

## 2016-08-04 DIAGNOSIS — F17210 Nicotine dependence, cigarettes, uncomplicated: Secondary | ICD-10-CM | POA: Diagnosis not present

## 2016-08-04 DIAGNOSIS — E1140 Type 2 diabetes mellitus with diabetic neuropathy, unspecified: Secondary | ICD-10-CM | POA: Diagnosis not present

## 2016-08-04 DIAGNOSIS — F329 Major depressive disorder, single episode, unspecified: Secondary | ICD-10-CM | POA: Diagnosis not present

## 2016-08-04 DIAGNOSIS — J189 Pneumonia, unspecified organism: Secondary | ICD-10-CM | POA: Diagnosis not present

## 2016-08-04 DIAGNOSIS — J441 Chronic obstructive pulmonary disease with (acute) exacerbation: Secondary | ICD-10-CM | POA: Diagnosis not present

## 2016-08-04 DIAGNOSIS — I10 Essential (primary) hypertension: Secondary | ICD-10-CM | POA: Diagnosis not present

## 2016-08-04 DIAGNOSIS — G40909 Epilepsy, unspecified, not intractable, without status epilepticus: Secondary | ICD-10-CM | POA: Diagnosis not present

## 2016-08-04 DIAGNOSIS — J440 Chronic obstructive pulmonary disease with acute lower respiratory infection: Secondary | ICD-10-CM | POA: Diagnosis not present

## 2016-08-07 DIAGNOSIS — G40909 Epilepsy, unspecified, not intractable, without status epilepticus: Secondary | ICD-10-CM | POA: Diagnosis not present

## 2016-08-07 DIAGNOSIS — E1140 Type 2 diabetes mellitus with diabetic neuropathy, unspecified: Secondary | ICD-10-CM | POA: Diagnosis not present

## 2016-08-07 DIAGNOSIS — I2510 Atherosclerotic heart disease of native coronary artery without angina pectoris: Secondary | ICD-10-CM | POA: Diagnosis not present

## 2016-08-07 DIAGNOSIS — J189 Pneumonia, unspecified organism: Secondary | ICD-10-CM | POA: Diagnosis not present

## 2016-08-07 DIAGNOSIS — J441 Chronic obstructive pulmonary disease with (acute) exacerbation: Secondary | ICD-10-CM | POA: Diagnosis not present

## 2016-08-07 DIAGNOSIS — J440 Chronic obstructive pulmonary disease with acute lower respiratory infection: Secondary | ICD-10-CM | POA: Diagnosis not present

## 2016-08-07 DIAGNOSIS — F329 Major depressive disorder, single episode, unspecified: Secondary | ICD-10-CM | POA: Diagnosis not present

## 2016-08-07 DIAGNOSIS — I10 Essential (primary) hypertension: Secondary | ICD-10-CM | POA: Diagnosis not present

## 2016-08-07 DIAGNOSIS — F17210 Nicotine dependence, cigarettes, uncomplicated: Secondary | ICD-10-CM | POA: Diagnosis not present

## 2016-08-13 DIAGNOSIS — J440 Chronic obstructive pulmonary disease with acute lower respiratory infection: Secondary | ICD-10-CM | POA: Diagnosis not present

## 2016-08-13 DIAGNOSIS — J189 Pneumonia, unspecified organism: Secondary | ICD-10-CM | POA: Diagnosis not present

## 2016-08-13 DIAGNOSIS — E1140 Type 2 diabetes mellitus with diabetic neuropathy, unspecified: Secondary | ICD-10-CM | POA: Diagnosis not present

## 2016-08-13 DIAGNOSIS — J441 Chronic obstructive pulmonary disease with (acute) exacerbation: Secondary | ICD-10-CM | POA: Diagnosis not present

## 2016-08-13 DIAGNOSIS — I2510 Atherosclerotic heart disease of native coronary artery without angina pectoris: Secondary | ICD-10-CM | POA: Diagnosis not present

## 2016-08-13 DIAGNOSIS — F17210 Nicotine dependence, cigarettes, uncomplicated: Secondary | ICD-10-CM | POA: Diagnosis not present

## 2016-08-13 DIAGNOSIS — G40909 Epilepsy, unspecified, not intractable, without status epilepticus: Secondary | ICD-10-CM | POA: Diagnosis not present

## 2016-08-13 DIAGNOSIS — F329 Major depressive disorder, single episode, unspecified: Secondary | ICD-10-CM | POA: Diagnosis not present

## 2016-08-13 DIAGNOSIS — I10 Essential (primary) hypertension: Secondary | ICD-10-CM | POA: Diagnosis not present

## 2016-08-18 DIAGNOSIS — J181 Lobar pneumonia, unspecified organism: Secondary | ICD-10-CM | POA: Diagnosis not present

## 2016-08-20 DIAGNOSIS — R079 Chest pain, unspecified: Secondary | ICD-10-CM | POA: Diagnosis not present

## 2016-08-20 DIAGNOSIS — R002 Palpitations: Secondary | ICD-10-CM | POA: Diagnosis not present

## 2016-08-22 DIAGNOSIS — R079 Chest pain, unspecified: Secondary | ICD-10-CM | POA: Diagnosis not present

## 2016-08-22 DIAGNOSIS — J449 Chronic obstructive pulmonary disease, unspecified: Secondary | ICD-10-CM | POA: Diagnosis not present

## 2016-08-22 DIAGNOSIS — R002 Palpitations: Secondary | ICD-10-CM | POA: Diagnosis not present

## 2016-08-22 DIAGNOSIS — K210 Gastro-esophageal reflux disease with esophagitis: Secondary | ICD-10-CM | POA: Diagnosis not present

## 2016-08-28 DIAGNOSIS — E1140 Type 2 diabetes mellitus with diabetic neuropathy, unspecified: Secondary | ICD-10-CM | POA: Diagnosis not present

## 2016-08-28 DIAGNOSIS — F17210 Nicotine dependence, cigarettes, uncomplicated: Secondary | ICD-10-CM | POA: Diagnosis not present

## 2016-08-28 DIAGNOSIS — F329 Major depressive disorder, single episode, unspecified: Secondary | ICD-10-CM | POA: Diagnosis not present

## 2016-08-28 DIAGNOSIS — I10 Essential (primary) hypertension: Secondary | ICD-10-CM | POA: Diagnosis not present

## 2016-08-28 DIAGNOSIS — I2510 Atherosclerotic heart disease of native coronary artery without angina pectoris: Secondary | ICD-10-CM | POA: Diagnosis not present

## 2016-08-28 DIAGNOSIS — J441 Chronic obstructive pulmonary disease with (acute) exacerbation: Secondary | ICD-10-CM | POA: Diagnosis not present

## 2016-08-28 DIAGNOSIS — J440 Chronic obstructive pulmonary disease with acute lower respiratory infection: Secondary | ICD-10-CM | POA: Diagnosis not present

## 2016-08-28 DIAGNOSIS — G40909 Epilepsy, unspecified, not intractable, without status epilepticus: Secondary | ICD-10-CM | POA: Diagnosis not present

## 2016-08-28 DIAGNOSIS — J189 Pneumonia, unspecified organism: Secondary | ICD-10-CM | POA: Diagnosis not present

## 2016-09-09 DIAGNOSIS — E1140 Type 2 diabetes mellitus with diabetic neuropathy, unspecified: Secondary | ICD-10-CM | POA: Diagnosis not present

## 2016-09-09 DIAGNOSIS — G40909 Epilepsy, unspecified, not intractable, without status epilepticus: Secondary | ICD-10-CM | POA: Diagnosis not present

## 2016-09-09 DIAGNOSIS — F329 Major depressive disorder, single episode, unspecified: Secondary | ICD-10-CM | POA: Diagnosis not present

## 2016-09-09 DIAGNOSIS — J440 Chronic obstructive pulmonary disease with acute lower respiratory infection: Secondary | ICD-10-CM | POA: Diagnosis not present

## 2016-09-09 DIAGNOSIS — I2510 Atherosclerotic heart disease of native coronary artery without angina pectoris: Secondary | ICD-10-CM | POA: Diagnosis not present

## 2016-09-09 DIAGNOSIS — F17210 Nicotine dependence, cigarettes, uncomplicated: Secondary | ICD-10-CM | POA: Diagnosis not present

## 2016-09-09 DIAGNOSIS — J189 Pneumonia, unspecified organism: Secondary | ICD-10-CM | POA: Diagnosis not present

## 2016-09-09 DIAGNOSIS — J441 Chronic obstructive pulmonary disease with (acute) exacerbation: Secondary | ICD-10-CM | POA: Diagnosis not present

## 2016-09-09 DIAGNOSIS — I10 Essential (primary) hypertension: Secondary | ICD-10-CM | POA: Diagnosis not present

## 2016-09-13 DIAGNOSIS — I2510 Atherosclerotic heart disease of native coronary artery without angina pectoris: Secondary | ICD-10-CM | POA: Diagnosis not present

## 2016-09-13 DIAGNOSIS — J189 Pneumonia, unspecified organism: Secondary | ICD-10-CM | POA: Diagnosis not present

## 2016-09-13 DIAGNOSIS — G40909 Epilepsy, unspecified, not intractable, without status epilepticus: Secondary | ICD-10-CM | POA: Diagnosis not present

## 2016-09-13 DIAGNOSIS — J441 Chronic obstructive pulmonary disease with (acute) exacerbation: Secondary | ICD-10-CM | POA: Diagnosis not present

## 2016-09-13 DIAGNOSIS — F17210 Nicotine dependence, cigarettes, uncomplicated: Secondary | ICD-10-CM | POA: Diagnosis not present

## 2016-09-13 DIAGNOSIS — J440 Chronic obstructive pulmonary disease with acute lower respiratory infection: Secondary | ICD-10-CM | POA: Diagnosis not present

## 2016-09-13 DIAGNOSIS — I10 Essential (primary) hypertension: Secondary | ICD-10-CM | POA: Diagnosis not present

## 2016-09-13 DIAGNOSIS — E1140 Type 2 diabetes mellitus with diabetic neuropathy, unspecified: Secondary | ICD-10-CM | POA: Diagnosis not present

## 2016-09-13 DIAGNOSIS — F329 Major depressive disorder, single episode, unspecified: Secondary | ICD-10-CM | POA: Diagnosis not present

## 2016-09-16 DIAGNOSIS — J441 Chronic obstructive pulmonary disease with (acute) exacerbation: Secondary | ICD-10-CM | POA: Diagnosis not present

## 2016-09-16 DIAGNOSIS — I10 Essential (primary) hypertension: Secondary | ICD-10-CM | POA: Diagnosis not present

## 2016-09-16 DIAGNOSIS — I2510 Atherosclerotic heart disease of native coronary artery without angina pectoris: Secondary | ICD-10-CM | POA: Diagnosis not present

## 2016-09-16 DIAGNOSIS — F17210 Nicotine dependence, cigarettes, uncomplicated: Secondary | ICD-10-CM | POA: Diagnosis not present

## 2016-09-16 DIAGNOSIS — G40909 Epilepsy, unspecified, not intractable, without status epilepticus: Secondary | ICD-10-CM | POA: Diagnosis not present

## 2016-09-16 DIAGNOSIS — F329 Major depressive disorder, single episode, unspecified: Secondary | ICD-10-CM | POA: Diagnosis not present

## 2016-09-16 DIAGNOSIS — J440 Chronic obstructive pulmonary disease with acute lower respiratory infection: Secondary | ICD-10-CM | POA: Diagnosis not present

## 2016-09-16 DIAGNOSIS — J189 Pneumonia, unspecified organism: Secondary | ICD-10-CM | POA: Diagnosis not present

## 2016-09-16 DIAGNOSIS — E1140 Type 2 diabetes mellitus with diabetic neuropathy, unspecified: Secondary | ICD-10-CM | POA: Diagnosis not present

## 2016-09-23 DIAGNOSIS — I10 Essential (primary) hypertension: Secondary | ICD-10-CM | POA: Diagnosis not present

## 2016-09-23 DIAGNOSIS — E1140 Type 2 diabetes mellitus with diabetic neuropathy, unspecified: Secondary | ICD-10-CM | POA: Diagnosis not present

## 2016-09-23 DIAGNOSIS — I2510 Atherosclerotic heart disease of native coronary artery without angina pectoris: Secondary | ICD-10-CM | POA: Diagnosis not present

## 2016-09-23 DIAGNOSIS — J441 Chronic obstructive pulmonary disease with (acute) exacerbation: Secondary | ICD-10-CM | POA: Diagnosis not present

## 2016-09-23 DIAGNOSIS — F329 Major depressive disorder, single episode, unspecified: Secondary | ICD-10-CM | POA: Diagnosis not present

## 2016-09-23 DIAGNOSIS — G40909 Epilepsy, unspecified, not intractable, without status epilepticus: Secondary | ICD-10-CM | POA: Diagnosis not present

## 2016-09-23 DIAGNOSIS — J189 Pneumonia, unspecified organism: Secondary | ICD-10-CM | POA: Diagnosis not present

## 2016-09-23 DIAGNOSIS — F17210 Nicotine dependence, cigarettes, uncomplicated: Secondary | ICD-10-CM | POA: Diagnosis not present

## 2016-09-23 DIAGNOSIS — J440 Chronic obstructive pulmonary disease with acute lower respiratory infection: Secondary | ICD-10-CM | POA: Diagnosis not present

## 2016-09-25 DIAGNOSIS — E1140 Type 2 diabetes mellitus with diabetic neuropathy, unspecified: Secondary | ICD-10-CM | POA: Diagnosis not present

## 2016-09-25 DIAGNOSIS — F17210 Nicotine dependence, cigarettes, uncomplicated: Secondary | ICD-10-CM | POA: Diagnosis not present

## 2016-09-25 DIAGNOSIS — F329 Major depressive disorder, single episode, unspecified: Secondary | ICD-10-CM | POA: Diagnosis not present

## 2016-09-25 DIAGNOSIS — I2510 Atherosclerotic heart disease of native coronary artery without angina pectoris: Secondary | ICD-10-CM | POA: Diagnosis not present

## 2016-09-25 DIAGNOSIS — K219 Gastro-esophageal reflux disease without esophagitis: Secondary | ICD-10-CM | POA: Diagnosis not present

## 2016-09-25 DIAGNOSIS — G40909 Epilepsy, unspecified, not intractable, without status epilepticus: Secondary | ICD-10-CM | POA: Diagnosis not present

## 2016-09-25 DIAGNOSIS — J449 Chronic obstructive pulmonary disease, unspecified: Secondary | ICD-10-CM | POA: Diagnosis not present

## 2016-09-25 DIAGNOSIS — K7460 Unspecified cirrhosis of liver: Secondary | ICD-10-CM | POA: Diagnosis not present

## 2016-09-25 DIAGNOSIS — I10 Essential (primary) hypertension: Secondary | ICD-10-CM | POA: Diagnosis not present

## 2016-09-30 DIAGNOSIS — F17210 Nicotine dependence, cigarettes, uncomplicated: Secondary | ICD-10-CM | POA: Diagnosis not present

## 2016-09-30 DIAGNOSIS — G40909 Epilepsy, unspecified, not intractable, without status epilepticus: Secondary | ICD-10-CM | POA: Diagnosis not present

## 2016-09-30 DIAGNOSIS — J449 Chronic obstructive pulmonary disease, unspecified: Secondary | ICD-10-CM | POA: Diagnosis not present

## 2016-09-30 DIAGNOSIS — K7460 Unspecified cirrhosis of liver: Secondary | ICD-10-CM | POA: Diagnosis not present

## 2016-09-30 DIAGNOSIS — K219 Gastro-esophageal reflux disease without esophagitis: Secondary | ICD-10-CM | POA: Diagnosis not present

## 2016-09-30 DIAGNOSIS — F329 Major depressive disorder, single episode, unspecified: Secondary | ICD-10-CM | POA: Diagnosis not present

## 2016-09-30 DIAGNOSIS — I10 Essential (primary) hypertension: Secondary | ICD-10-CM | POA: Diagnosis not present

## 2016-09-30 DIAGNOSIS — E1140 Type 2 diabetes mellitus with diabetic neuropathy, unspecified: Secondary | ICD-10-CM | POA: Diagnosis not present

## 2016-09-30 DIAGNOSIS — I2510 Atherosclerotic heart disease of native coronary artery without angina pectoris: Secondary | ICD-10-CM | POA: Diagnosis not present

## 2016-10-07 DIAGNOSIS — E1140 Type 2 diabetes mellitus with diabetic neuropathy, unspecified: Secondary | ICD-10-CM | POA: Diagnosis not present

## 2016-10-07 DIAGNOSIS — I10 Essential (primary) hypertension: Secondary | ICD-10-CM | POA: Diagnosis not present

## 2016-10-07 DIAGNOSIS — G40909 Epilepsy, unspecified, not intractable, without status epilepticus: Secondary | ICD-10-CM | POA: Diagnosis not present

## 2016-10-07 DIAGNOSIS — F329 Major depressive disorder, single episode, unspecified: Secondary | ICD-10-CM | POA: Diagnosis not present

## 2016-10-07 DIAGNOSIS — K219 Gastro-esophageal reflux disease without esophagitis: Secondary | ICD-10-CM | POA: Diagnosis not present

## 2016-10-07 DIAGNOSIS — K7460 Unspecified cirrhosis of liver: Secondary | ICD-10-CM | POA: Diagnosis not present

## 2016-10-07 DIAGNOSIS — I2510 Atherosclerotic heart disease of native coronary artery without angina pectoris: Secondary | ICD-10-CM | POA: Diagnosis not present

## 2016-10-07 DIAGNOSIS — F17210 Nicotine dependence, cigarettes, uncomplicated: Secondary | ICD-10-CM | POA: Diagnosis not present

## 2016-10-07 DIAGNOSIS — J449 Chronic obstructive pulmonary disease, unspecified: Secondary | ICD-10-CM | POA: Diagnosis not present

## 2016-10-09 DIAGNOSIS — K210 Gastro-esophageal reflux disease with esophagitis: Secondary | ICD-10-CM | POA: Diagnosis not present

## 2016-10-09 DIAGNOSIS — R002 Palpitations: Secondary | ICD-10-CM | POA: Diagnosis not present

## 2016-10-09 DIAGNOSIS — R079 Chest pain, unspecified: Secondary | ICD-10-CM | POA: Diagnosis not present

## 2016-10-14 DIAGNOSIS — E1140 Type 2 diabetes mellitus with diabetic neuropathy, unspecified: Secondary | ICD-10-CM | POA: Diagnosis not present

## 2016-10-14 DIAGNOSIS — F17210 Nicotine dependence, cigarettes, uncomplicated: Secondary | ICD-10-CM | POA: Diagnosis not present

## 2016-10-14 DIAGNOSIS — I2510 Atherosclerotic heart disease of native coronary artery without angina pectoris: Secondary | ICD-10-CM | POA: Diagnosis not present

## 2016-10-14 DIAGNOSIS — I10 Essential (primary) hypertension: Secondary | ICD-10-CM | POA: Diagnosis not present

## 2016-10-14 DIAGNOSIS — K7460 Unspecified cirrhosis of liver: Secondary | ICD-10-CM | POA: Diagnosis not present

## 2016-10-14 DIAGNOSIS — K219 Gastro-esophageal reflux disease without esophagitis: Secondary | ICD-10-CM | POA: Diagnosis not present

## 2016-10-14 DIAGNOSIS — J449 Chronic obstructive pulmonary disease, unspecified: Secondary | ICD-10-CM | POA: Diagnosis not present

## 2016-10-14 DIAGNOSIS — G40909 Epilepsy, unspecified, not intractable, without status epilepticus: Secondary | ICD-10-CM | POA: Diagnosis not present

## 2016-10-14 DIAGNOSIS — F329 Major depressive disorder, single episode, unspecified: Secondary | ICD-10-CM | POA: Diagnosis not present

## 2016-10-16 DIAGNOSIS — J449 Chronic obstructive pulmonary disease, unspecified: Secondary | ICD-10-CM | POA: Diagnosis not present

## 2016-10-16 DIAGNOSIS — R079 Chest pain, unspecified: Secondary | ICD-10-CM | POA: Diagnosis not present

## 2016-10-16 DIAGNOSIS — R002 Palpitations: Secondary | ICD-10-CM | POA: Diagnosis not present

## 2016-10-16 DIAGNOSIS — K210 Gastro-esophageal reflux disease with esophagitis: Secondary | ICD-10-CM | POA: Diagnosis not present

## 2016-10-20 DIAGNOSIS — F17210 Nicotine dependence, cigarettes, uncomplicated: Secondary | ICD-10-CM | POA: Diagnosis not present

## 2016-10-20 DIAGNOSIS — J449 Chronic obstructive pulmonary disease, unspecified: Secondary | ICD-10-CM | POA: Diagnosis not present

## 2016-10-20 DIAGNOSIS — K7460 Unspecified cirrhosis of liver: Secondary | ICD-10-CM | POA: Diagnosis not present

## 2016-10-20 DIAGNOSIS — G40909 Epilepsy, unspecified, not intractable, without status epilepticus: Secondary | ICD-10-CM | POA: Diagnosis not present

## 2016-10-20 DIAGNOSIS — F329 Major depressive disorder, single episode, unspecified: Secondary | ICD-10-CM | POA: Diagnosis not present

## 2016-10-20 DIAGNOSIS — E1140 Type 2 diabetes mellitus with diabetic neuropathy, unspecified: Secondary | ICD-10-CM | POA: Diagnosis not present

## 2016-10-20 DIAGNOSIS — K219 Gastro-esophageal reflux disease without esophagitis: Secondary | ICD-10-CM | POA: Diagnosis not present

## 2016-10-20 DIAGNOSIS — I2510 Atherosclerotic heart disease of native coronary artery without angina pectoris: Secondary | ICD-10-CM | POA: Diagnosis not present

## 2016-10-20 DIAGNOSIS — I10 Essential (primary) hypertension: Secondary | ICD-10-CM | POA: Diagnosis not present

## 2016-10-27 DIAGNOSIS — K7460 Unspecified cirrhosis of liver: Secondary | ICD-10-CM | POA: Diagnosis not present

## 2016-10-27 DIAGNOSIS — K219 Gastro-esophageal reflux disease without esophagitis: Secondary | ICD-10-CM | POA: Diagnosis not present

## 2016-10-27 DIAGNOSIS — F17210 Nicotine dependence, cigarettes, uncomplicated: Secondary | ICD-10-CM | POA: Diagnosis not present

## 2016-10-27 DIAGNOSIS — I2510 Atherosclerotic heart disease of native coronary artery without angina pectoris: Secondary | ICD-10-CM | POA: Diagnosis not present

## 2016-10-27 DIAGNOSIS — F329 Major depressive disorder, single episode, unspecified: Secondary | ICD-10-CM | POA: Diagnosis not present

## 2016-10-27 DIAGNOSIS — J449 Chronic obstructive pulmonary disease, unspecified: Secondary | ICD-10-CM | POA: Diagnosis not present

## 2016-10-27 DIAGNOSIS — E1140 Type 2 diabetes mellitus with diabetic neuropathy, unspecified: Secondary | ICD-10-CM | POA: Diagnosis not present

## 2016-10-27 DIAGNOSIS — G40909 Epilepsy, unspecified, not intractable, without status epilepticus: Secondary | ICD-10-CM | POA: Diagnosis not present

## 2016-10-27 DIAGNOSIS — I10 Essential (primary) hypertension: Secondary | ICD-10-CM | POA: Diagnosis not present

## 2016-11-03 DIAGNOSIS — J449 Chronic obstructive pulmonary disease, unspecified: Secondary | ICD-10-CM | POA: Diagnosis not present

## 2016-11-03 DIAGNOSIS — G40909 Epilepsy, unspecified, not intractable, without status epilepticus: Secondary | ICD-10-CM | POA: Diagnosis not present

## 2016-11-03 DIAGNOSIS — I2510 Atherosclerotic heart disease of native coronary artery without angina pectoris: Secondary | ICD-10-CM | POA: Diagnosis not present

## 2016-11-03 DIAGNOSIS — F17210 Nicotine dependence, cigarettes, uncomplicated: Secondary | ICD-10-CM | POA: Diagnosis not present

## 2016-11-03 DIAGNOSIS — E1140 Type 2 diabetes mellitus with diabetic neuropathy, unspecified: Secondary | ICD-10-CM | POA: Diagnosis not present

## 2016-11-03 DIAGNOSIS — K7460 Unspecified cirrhosis of liver: Secondary | ICD-10-CM | POA: Diagnosis not present

## 2016-11-03 DIAGNOSIS — F329 Major depressive disorder, single episode, unspecified: Secondary | ICD-10-CM | POA: Diagnosis not present

## 2016-11-03 DIAGNOSIS — K219 Gastro-esophageal reflux disease without esophagitis: Secondary | ICD-10-CM | POA: Diagnosis not present

## 2016-11-03 DIAGNOSIS — I10 Essential (primary) hypertension: Secondary | ICD-10-CM | POA: Diagnosis not present

## 2016-11-04 DIAGNOSIS — J449 Chronic obstructive pulmonary disease, unspecified: Secondary | ICD-10-CM | POA: Diagnosis not present

## 2016-11-04 DIAGNOSIS — I2723 Pulmonary hypertension due to lung diseases and hypoxia: Secondary | ICD-10-CM | POA: Diagnosis not present

## 2016-11-04 DIAGNOSIS — J4520 Mild intermittent asthma, uncomplicated: Secondary | ICD-10-CM | POA: Diagnosis not present

## 2016-11-04 DIAGNOSIS — I6990 Unspecified sequelae of unspecified cerebrovascular disease: Secondary | ICD-10-CM | POA: Diagnosis not present

## 2016-11-04 DIAGNOSIS — K210 Gastro-esophageal reflux disease with esophagitis: Secondary | ICD-10-CM | POA: Diagnosis not present

## 2016-11-05 DIAGNOSIS — J449 Chronic obstructive pulmonary disease, unspecified: Secondary | ICD-10-CM | POA: Diagnosis not present

## 2016-11-05 DIAGNOSIS — G40909 Epilepsy, unspecified, not intractable, without status epilepticus: Secondary | ICD-10-CM | POA: Diagnosis not present

## 2016-11-05 DIAGNOSIS — F329 Major depressive disorder, single episode, unspecified: Secondary | ICD-10-CM | POA: Diagnosis not present

## 2016-11-05 DIAGNOSIS — F17210 Nicotine dependence, cigarettes, uncomplicated: Secondary | ICD-10-CM | POA: Diagnosis not present

## 2016-11-05 DIAGNOSIS — E1140 Type 2 diabetes mellitus with diabetic neuropathy, unspecified: Secondary | ICD-10-CM | POA: Diagnosis not present

## 2016-11-05 DIAGNOSIS — I10 Essential (primary) hypertension: Secondary | ICD-10-CM | POA: Diagnosis not present

## 2016-11-05 DIAGNOSIS — K7460 Unspecified cirrhosis of liver: Secondary | ICD-10-CM | POA: Diagnosis not present

## 2016-11-05 DIAGNOSIS — K219 Gastro-esophageal reflux disease without esophagitis: Secondary | ICD-10-CM | POA: Diagnosis not present

## 2016-11-05 DIAGNOSIS — I2510 Atherosclerotic heart disease of native coronary artery without angina pectoris: Secondary | ICD-10-CM | POA: Diagnosis not present

## 2016-11-05 DIAGNOSIS — R3915 Urgency of urination: Secondary | ICD-10-CM | POA: Diagnosis not present

## 2016-11-05 DIAGNOSIS — R350 Frequency of micturition: Secondary | ICD-10-CM | POA: Diagnosis not present

## 2016-11-11 DIAGNOSIS — I10 Essential (primary) hypertension: Secondary | ICD-10-CM | POA: Diagnosis not present

## 2016-11-11 DIAGNOSIS — K7460 Unspecified cirrhosis of liver: Secondary | ICD-10-CM | POA: Diagnosis not present

## 2016-11-11 DIAGNOSIS — E1140 Type 2 diabetes mellitus with diabetic neuropathy, unspecified: Secondary | ICD-10-CM | POA: Diagnosis not present

## 2016-11-11 DIAGNOSIS — F17210 Nicotine dependence, cigarettes, uncomplicated: Secondary | ICD-10-CM | POA: Diagnosis not present

## 2016-11-11 DIAGNOSIS — G40909 Epilepsy, unspecified, not intractable, without status epilepticus: Secondary | ICD-10-CM | POA: Diagnosis not present

## 2016-11-11 DIAGNOSIS — I2510 Atherosclerotic heart disease of native coronary artery without angina pectoris: Secondary | ICD-10-CM | POA: Diagnosis not present

## 2016-11-11 DIAGNOSIS — J449 Chronic obstructive pulmonary disease, unspecified: Secondary | ICD-10-CM | POA: Diagnosis not present

## 2016-11-11 DIAGNOSIS — K219 Gastro-esophageal reflux disease without esophagitis: Secondary | ICD-10-CM | POA: Diagnosis not present

## 2016-11-11 DIAGNOSIS — F329 Major depressive disorder, single episode, unspecified: Secondary | ICD-10-CM | POA: Diagnosis not present

## 2016-11-19 DIAGNOSIS — G40909 Epilepsy, unspecified, not intractable, without status epilepticus: Secondary | ICD-10-CM | POA: Diagnosis not present

## 2016-11-19 DIAGNOSIS — F17210 Nicotine dependence, cigarettes, uncomplicated: Secondary | ICD-10-CM | POA: Diagnosis not present

## 2016-11-19 DIAGNOSIS — K7460 Unspecified cirrhosis of liver: Secondary | ICD-10-CM | POA: Diagnosis not present

## 2016-11-19 DIAGNOSIS — F329 Major depressive disorder, single episode, unspecified: Secondary | ICD-10-CM | POA: Diagnosis not present

## 2016-11-19 DIAGNOSIS — I10 Essential (primary) hypertension: Secondary | ICD-10-CM | POA: Diagnosis not present

## 2016-11-19 DIAGNOSIS — J449 Chronic obstructive pulmonary disease, unspecified: Secondary | ICD-10-CM | POA: Diagnosis not present

## 2016-11-19 DIAGNOSIS — E1140 Type 2 diabetes mellitus with diabetic neuropathy, unspecified: Secondary | ICD-10-CM | POA: Diagnosis not present

## 2016-11-19 DIAGNOSIS — K219 Gastro-esophageal reflux disease without esophagitis: Secondary | ICD-10-CM | POA: Diagnosis not present

## 2016-11-19 DIAGNOSIS — I2510 Atherosclerotic heart disease of native coronary artery without angina pectoris: Secondary | ICD-10-CM | POA: Diagnosis not present

## 2016-11-22 DIAGNOSIS — I10 Essential (primary) hypertension: Secondary | ICD-10-CM | POA: Diagnosis not present

## 2016-11-22 DIAGNOSIS — E1140 Type 2 diabetes mellitus with diabetic neuropathy, unspecified: Secondary | ICD-10-CM | POA: Diagnosis not present

## 2016-11-22 DIAGNOSIS — K219 Gastro-esophageal reflux disease without esophagitis: Secondary | ICD-10-CM | POA: Diagnosis not present

## 2016-11-22 DIAGNOSIS — G40909 Epilepsy, unspecified, not intractable, without status epilepticus: Secondary | ICD-10-CM | POA: Diagnosis not present

## 2016-11-22 DIAGNOSIS — I2510 Atherosclerotic heart disease of native coronary artery without angina pectoris: Secondary | ICD-10-CM | POA: Diagnosis not present

## 2016-11-22 DIAGNOSIS — J449 Chronic obstructive pulmonary disease, unspecified: Secondary | ICD-10-CM | POA: Diagnosis not present

## 2016-11-22 DIAGNOSIS — F329 Major depressive disorder, single episode, unspecified: Secondary | ICD-10-CM | POA: Diagnosis not present

## 2016-11-22 DIAGNOSIS — F17210 Nicotine dependence, cigarettes, uncomplicated: Secondary | ICD-10-CM | POA: Diagnosis not present

## 2016-11-22 DIAGNOSIS — K7460 Unspecified cirrhosis of liver: Secondary | ICD-10-CM | POA: Diagnosis not present

## 2016-11-24 DIAGNOSIS — G40909 Epilepsy, unspecified, not intractable, without status epilepticus: Secondary | ICD-10-CM | POA: Diagnosis not present

## 2016-11-24 DIAGNOSIS — E1140 Type 2 diabetes mellitus with diabetic neuropathy, unspecified: Secondary | ICD-10-CM | POA: Diagnosis not present

## 2016-11-24 DIAGNOSIS — I10 Essential (primary) hypertension: Secondary | ICD-10-CM | POA: Diagnosis not present

## 2016-11-24 DIAGNOSIS — F17210 Nicotine dependence, cigarettes, uncomplicated: Secondary | ICD-10-CM | POA: Diagnosis not present

## 2016-11-24 DIAGNOSIS — K219 Gastro-esophageal reflux disease without esophagitis: Secondary | ICD-10-CM | POA: Diagnosis not present

## 2016-11-24 DIAGNOSIS — K7460 Unspecified cirrhosis of liver: Secondary | ICD-10-CM | POA: Diagnosis not present

## 2016-11-24 DIAGNOSIS — F329 Major depressive disorder, single episode, unspecified: Secondary | ICD-10-CM | POA: Diagnosis not present

## 2016-11-24 DIAGNOSIS — J449 Chronic obstructive pulmonary disease, unspecified: Secondary | ICD-10-CM | POA: Diagnosis not present

## 2016-11-24 DIAGNOSIS — I2510 Atherosclerotic heart disease of native coronary artery without angina pectoris: Secondary | ICD-10-CM | POA: Diagnosis not present

## 2016-11-26 DIAGNOSIS — I10 Essential (primary) hypertension: Secondary | ICD-10-CM | POA: Diagnosis not present

## 2016-11-26 DIAGNOSIS — J449 Chronic obstructive pulmonary disease, unspecified: Secondary | ICD-10-CM | POA: Diagnosis not present

## 2016-11-26 DIAGNOSIS — E1140 Type 2 diabetes mellitus with diabetic neuropathy, unspecified: Secondary | ICD-10-CM | POA: Diagnosis not present

## 2016-11-26 DIAGNOSIS — F329 Major depressive disorder, single episode, unspecified: Secondary | ICD-10-CM | POA: Diagnosis not present

## 2016-11-26 DIAGNOSIS — F17210 Nicotine dependence, cigarettes, uncomplicated: Secondary | ICD-10-CM | POA: Diagnosis not present

## 2016-11-26 DIAGNOSIS — I2510 Atherosclerotic heart disease of native coronary artery without angina pectoris: Secondary | ICD-10-CM | POA: Diagnosis not present

## 2016-11-26 DIAGNOSIS — K219 Gastro-esophageal reflux disease without esophagitis: Secondary | ICD-10-CM | POA: Diagnosis not present

## 2016-11-26 DIAGNOSIS — K7460 Unspecified cirrhosis of liver: Secondary | ICD-10-CM | POA: Diagnosis not present

## 2016-11-26 DIAGNOSIS — G40909 Epilepsy, unspecified, not intractable, without status epilepticus: Secondary | ICD-10-CM | POA: Diagnosis not present

## 2016-11-27 DIAGNOSIS — J449 Chronic obstructive pulmonary disease, unspecified: Secondary | ICD-10-CM | POA: Diagnosis not present

## 2016-11-27 DIAGNOSIS — R002 Palpitations: Secondary | ICD-10-CM | POA: Diagnosis not present

## 2016-11-27 DIAGNOSIS — R079 Chest pain, unspecified: Secondary | ICD-10-CM | POA: Diagnosis not present

## 2016-11-28 DIAGNOSIS — K219 Gastro-esophageal reflux disease without esophagitis: Secondary | ICD-10-CM | POA: Diagnosis not present

## 2016-11-28 DIAGNOSIS — J449 Chronic obstructive pulmonary disease, unspecified: Secondary | ICD-10-CM | POA: Diagnosis not present

## 2016-11-28 DIAGNOSIS — F17210 Nicotine dependence, cigarettes, uncomplicated: Secondary | ICD-10-CM | POA: Diagnosis not present

## 2016-11-28 DIAGNOSIS — I10 Essential (primary) hypertension: Secondary | ICD-10-CM | POA: Diagnosis not present

## 2016-11-28 DIAGNOSIS — G40909 Epilepsy, unspecified, not intractable, without status epilepticus: Secondary | ICD-10-CM | POA: Diagnosis not present

## 2016-11-28 DIAGNOSIS — I2510 Atherosclerotic heart disease of native coronary artery without angina pectoris: Secondary | ICD-10-CM | POA: Diagnosis not present

## 2016-11-28 DIAGNOSIS — K7460 Unspecified cirrhosis of liver: Secondary | ICD-10-CM | POA: Diagnosis not present

## 2016-11-28 DIAGNOSIS — E1140 Type 2 diabetes mellitus with diabetic neuropathy, unspecified: Secondary | ICD-10-CM | POA: Diagnosis not present

## 2016-11-28 DIAGNOSIS — F329 Major depressive disorder, single episode, unspecified: Secondary | ICD-10-CM | POA: Diagnosis not present

## 2016-12-01 DIAGNOSIS — I10 Essential (primary) hypertension: Secondary | ICD-10-CM | POA: Diagnosis not present

## 2016-12-01 DIAGNOSIS — F329 Major depressive disorder, single episode, unspecified: Secondary | ICD-10-CM | POA: Diagnosis not present

## 2016-12-01 DIAGNOSIS — G40909 Epilepsy, unspecified, not intractable, without status epilepticus: Secondary | ICD-10-CM | POA: Diagnosis not present

## 2016-12-01 DIAGNOSIS — F17210 Nicotine dependence, cigarettes, uncomplicated: Secondary | ICD-10-CM | POA: Diagnosis not present

## 2016-12-01 DIAGNOSIS — E1140 Type 2 diabetes mellitus with diabetic neuropathy, unspecified: Secondary | ICD-10-CM | POA: Diagnosis not present

## 2016-12-01 DIAGNOSIS — I2510 Atherosclerotic heart disease of native coronary artery without angina pectoris: Secondary | ICD-10-CM | POA: Diagnosis not present

## 2016-12-01 DIAGNOSIS — J449 Chronic obstructive pulmonary disease, unspecified: Secondary | ICD-10-CM | POA: Diagnosis not present

## 2016-12-01 DIAGNOSIS — K219 Gastro-esophageal reflux disease without esophagitis: Secondary | ICD-10-CM | POA: Diagnosis not present

## 2016-12-01 DIAGNOSIS — K7460 Unspecified cirrhosis of liver: Secondary | ICD-10-CM | POA: Diagnosis not present

## 2016-12-03 DIAGNOSIS — F17210 Nicotine dependence, cigarettes, uncomplicated: Secondary | ICD-10-CM | POA: Diagnosis not present

## 2016-12-03 DIAGNOSIS — K219 Gastro-esophageal reflux disease without esophagitis: Secondary | ICD-10-CM | POA: Diagnosis not present

## 2016-12-03 DIAGNOSIS — I2510 Atherosclerotic heart disease of native coronary artery without angina pectoris: Secondary | ICD-10-CM | POA: Diagnosis not present

## 2016-12-03 DIAGNOSIS — E1140 Type 2 diabetes mellitus with diabetic neuropathy, unspecified: Secondary | ICD-10-CM | POA: Diagnosis not present

## 2016-12-03 DIAGNOSIS — I10 Essential (primary) hypertension: Secondary | ICD-10-CM | POA: Diagnosis not present

## 2016-12-03 DIAGNOSIS — J449 Chronic obstructive pulmonary disease, unspecified: Secondary | ICD-10-CM | POA: Diagnosis not present

## 2016-12-03 DIAGNOSIS — G40909 Epilepsy, unspecified, not intractable, without status epilepticus: Secondary | ICD-10-CM | POA: Diagnosis not present

## 2016-12-03 DIAGNOSIS — K7460 Unspecified cirrhosis of liver: Secondary | ICD-10-CM | POA: Diagnosis not present

## 2016-12-03 DIAGNOSIS — F329 Major depressive disorder, single episode, unspecified: Secondary | ICD-10-CM | POA: Diagnosis not present

## 2016-12-05 ENCOUNTER — Emergency Department
Admission: EM | Admit: 2016-12-05 | Discharge: 2016-12-05 | Disposition: A | Discharge status: Home / Self Care | Payer: Medicare HMO | Attending: Emergency Medicine | Admitting: Emergency Medicine

## 2016-12-05 ENCOUNTER — Encounter: Payer: Self-pay | Admitting: Vascular Surgery

## 2016-12-05 DIAGNOSIS — Z9114 Patient's other noncompliance with medication regimen: Secondary | ICD-10-CM | POA: Insufficient documentation

## 2016-12-05 DIAGNOSIS — G40909 Epilepsy, unspecified, not intractable, without status epilepticus: Secondary | ICD-10-CM | POA: Diagnosis not present

## 2016-12-05 DIAGNOSIS — R569 Unspecified convulsions: Secondary | ICD-10-CM

## 2016-12-05 DIAGNOSIS — E119 Type 2 diabetes mellitus without complications: Secondary | ICD-10-CM | POA: Insufficient documentation

## 2016-12-05 DIAGNOSIS — Z79899 Other long term (current) drug therapy: Secondary | ICD-10-CM | POA: Diagnosis not present

## 2016-12-05 DIAGNOSIS — I10 Essential (primary) hypertension: Secondary | ICD-10-CM | POA: Diagnosis not present

## 2016-12-05 DIAGNOSIS — J45909 Unspecified asthma, uncomplicated: Secondary | ICD-10-CM | POA: Diagnosis not present

## 2016-12-05 DIAGNOSIS — J4520 Mild intermittent asthma, uncomplicated: Secondary | ICD-10-CM | POA: Diagnosis not present

## 2016-12-05 DIAGNOSIS — F17200 Nicotine dependence, unspecified, uncomplicated: Secondary | ICD-10-CM | POA: Diagnosis not present

## 2016-12-05 DIAGNOSIS — J449 Chronic obstructive pulmonary disease, unspecified: Secondary | ICD-10-CM | POA: Insufficient documentation

## 2016-12-05 DIAGNOSIS — I6990 Unspecified sequelae of unspecified cerebrovascular disease: Secondary | ICD-10-CM | POA: Diagnosis not present

## 2016-12-05 DIAGNOSIS — K210 Gastro-esophageal reflux disease with esophagitis: Secondary | ICD-10-CM | POA: Diagnosis not present

## 2016-12-05 DIAGNOSIS — I2723 Pulmonary hypertension due to lung diseases and hypoxia: Secondary | ICD-10-CM | POA: Diagnosis not present

## 2016-12-05 MED ORDER — SODIUM CHLORIDE 0.9 % IV SOLN
100.0000 mg | Freq: Once | INTRAVENOUS | Status: DC
  Filled 2016-12-05: qty 10

## 2016-12-05 MED ORDER — SODIUM CHLORIDE 0.9 % IV BOLUS (SEPSIS): 1000.0000 mL | Freq: Once | INTRAVENOUS | Status: DC

## 2016-12-05 MED ORDER — LACOSAMIDE 50 MG PO TABS
100.0000 mg | ORAL_TABLET | Freq: Two times a day (BID) | ORAL | Status: DC
  Administered 2016-12-05: 100 mg via ORAL
  Filled 2016-12-05: qty 2

## 2016-12-05 MED ORDER — ONDANSETRON HCL 4 MG/2ML IJ SOLN
4.0000 mg | Freq: Once | INTRAMUSCULAR | Status: DC
  Filled 2016-12-05: qty 2

## 2016-12-05 NOTE — Discharge Instructions (Signed)
Seizures may happen at any time. It is important to take certain precautions to maintain your safety.   Follow up with your doctor in 1-3 days.  If you were started on a seizure medication, take it as prescribed.  During a seizure, a person may injure himself or herself. Seizure precautions are guidelines that a person can follow in order to minimize injury during a seizure. For any activity, it is important to ask, "What would happen if I had a seizure while doing this?" Follow the below precautions.  Bathroom Safety  A person with seizures may want to shower instead of bathe to avoid accidental drowning. If falls occur during the patient's typical seizure, a person should use a shower seat, preferably one with a safety strap.  Use nonskid strips in your shower or tub.  Never use electrical equipment near water. This prevents accidental electrocution.  Consider changing glass in shower doors to shatterproof glass.  Risk analyst If possible, cook when someone else is nearby.  Use the back burners of the stove to prevent accidental burns.  Use shatterproof containers as much as possible. For instance, sauces can be transferred from glass bottles to plastic containers for use.  Limit time that is required using knives or other sharp objects. If possible, buy foods that are already cut, or ask someone to help in meal preparation.   General Safety at [place] not smoke or light fires in the fireplace unless someone else is present.  Do not use space heaters that can be accidentally overturned.  When alone, avoid using step stools or ladders, and do not clean rooftop gutters.  Purchase power tools and motorized Company secretary which have a safety switch that will stop the machine if you release the handle (a 'dead man's' switch).   Driving and Transportation Avoid driving unless your seizures are well controlled and/or you have permission to drive from your state's Department of Motor Vehicles    [facility]). Each state has different laws. Please refer to the following link on the [employer] website for more information: http://www.epilepsyfoundation.org/answerplace/Social/driving/drivingu.cfm  If you ride a bicycle, wear a helmet and any other necessary protective gear.  When taking public transportation like the bus or subway, stay clear of the platform edge.   Outdoor Insurance underwriter is okay, but does present certain risks. Never swim alone, and tell friends what to do if you have a seizure while swimming.  Wear appropriate protective equipment.  Ski with a friend. If a seizure occurs, your friend can seek help, if needed. He or she can also help to get you out of the cold. Consider using a safety hook or belt while riding the ski lift.

## 2016-12-05 NOTE — ED Triage Notes (Signed)
Pt reports to the ED for eval of seizure like activity today. She was at the [facility] when she started feeling dizzy. Her family lowered her to the floor and she had twitching of the hands and loss of consciousness. She did have some post-ictal states. Denies any incontinence or oral trauma. Last seizure was a year ago. She is on Gabapentin. Denies any missed doses. Pt is complaining of some CP which she has had for several months. She has recently been wearing a Holter monitor for some irregular HR but she has not had her results back yet. Pt is deaf. Pts O2 sats are 87% on RA.

## 2016-12-05 NOTE — ED Notes (Signed)
Pt daughter reports that pt does not wants to be discharged; and she does not want to be treated any further, does not want medications, or any further testing.

## 2016-12-05 NOTE — ED Provider Notes (Signed)
[facility] Emergency Department Provider Note  ____________________________________________  Time seen: Approximately 11:35 AM  I have reviewed the triage vital signs and the nursing notes.   HISTORY  Chief Complaint Seizures   HPI [name] is a 66 y.o. female with a history of seizure disorder who presents for evaluation after having a seizure in the setting of medication noncompliance. Patient ran out of her Vimpat 2 weeks ago and has been working with her insurance pharmacy to get it refilled. She is supposed to receive it today or the beginning of next week. Patient is unable to afford the medication on a regular pharmacy. Today she was a DMV with her daughter when she started feeling dizzy and had a seizure. According to the daughter she had generalized tonic-clonic activity lasting about 2 minutes. Patient was postictal. No urinary or bowel incontinence, no tongue trauma. Patient was helped to the ground by her daughter and did not sustain any injuries. Right now she is complaining of a mild 2 out of 10 left-sided headache which she usually has after having seizures. No chest pain or shortness of breath, no abdominal pain, no nausea or vomiting, no dysuria hematuria, no diarrhea.Patient is back to baseline.  Past Medical History:  Diagnosis Date  . Asthma   . Cirrhosis, non-alcoholic ([place])   . COPD (chronic obstructive pulmonary disease) ([place])   . Deaf   . Depression   . Diabetes mellitus without complication ([place])   . GERD (gastroesophageal reflux disease)   . Heart murmur   . Hepatitis   . Hypertension   . Lymph node disorder    arm  . Neuropathy   . On home oxygen therapy    hs  . Orthopnea   . RLS (restless legs syndrome)   . Seizures ([place])   . Shortness of breath dyspnea   . Sleep apnea   . Stroke [facility])    tia    Patient Active Problem List   Diagnosis Date Noted  . GERD (gastroesophageal reflux disease) 03/23/2016  .  Depression 03/23/2016  . Convulsions/seizures ([place]) 03/22/2016  . DM (diabetes mellitus), type 2 ([place]) 03/22/2016  . Essential hypertension 03/22/2016  . Neuropathy 03/22/2016  . Convulsions ([place]) 03/22/2016  . COPD exacerbation ([place]) 03/31/2015  . Acute respiratory failure with hypoxia ([place]) 03/29/2015    Past Surgical History:  Procedure Laterality Date  . CATARACT EXTRACTION W/PHACO Right 11/23/2014   Procedure: CATARACT EXTRACTION PHACO AND INTRAOCULAR LENS PLACEMENT (IOC);  Surgeon: Lyla Glassing, MD;  Location: [facility] ORS;  Service: Ophthalmology;  Laterality: Right;  . CATARACT EXTRACTION W/PHACO Left 12/14/2014   Procedure: CATARACT EXTRACTION PHACO AND INTRAOCULAR LENS PLACEMENT (IOC);  Surgeon: Lyla Glassing, MD;  Location: [facility] ORS;  Service: Ophthalmology;  Laterality: Left;  US:01:16.6 AP:15.8 CDE:12.14  . CESAREAN SECTION    . CHOLECYSTECTOMY    . KNEE ARTHROPLASTY    . THUMB ARTHROSCOPY    . TONSILLECTOMY    . TYMPANOPLASTY     muliple    Prior to Admission medications   Medication Sig Start Date End Date Taking? Authorizing Provider  albuterol (PROVENTIL HFA;VENTOLIN HFA) 108 (90 BASE) MCG/ACT inhaler Inhale 2 puffs into the lungs every 4 (four) hours as needed for wheezing or shortness of breath.    [provider]  albuterol (PROVENTIL) (2.5 MG/3ML) 0.083% nebulizer solution Take 3 mLs (2.5 mg total) by nebulization every 4 (four) hours as needed for wheezing or shortness of breath. 07/17/16   Anselm Jungling,  Rosalio Macadamia, MD  amLODipine (NORVASC) 10 MG tablet Take 1 tablet (10 mg total) by mouth daily. 07/17/16   Vaughan Basta, MD  carboxymethylcellulose (REFRESH TEARS) 0.5 % SOLN Place 1 drop into both eyes daily as needed.    [provider]  citalopram (CELEXA) 20 MG tablet Take 1 tablet (20 mg total) by mouth daily. 07/17/16   Vaughan Basta, MD  clopidogrel (PLAVIX) 75 MG tablet Take 1 tablet (75 mg total) by mouth daily. 07/17/16    Vaughan Basta, MD  dextromethorphan (DELSYM) 30 MG/5ML liquid Take 5 mLs (30 mg total) by mouth 2 (two) times daily. 07/17/16   Vaughan Basta, MD  diphenhydrAMINE (SOMINEX) 25 MG tablet Take 25 mg by mouth at bedtime as needed for sleep.    [provider]  enalapril (VASOTEC) 20 MG tablet Take 1 tablet (20 mg total) by mouth daily. 07/17/16   Vaughan Basta, MD  EPINEPHrine 0.3 mg/0.3 mL IJ SOAJ injection Inject into the muscle once.    [provider]  furosemide (LASIX) 40 MG tablet Take 1 tablet (40 mg total) by mouth daily. 07/17/16   Vaughan Basta, MD  gabapentin (NEURONTIN) 300 MG capsule Take 1 capsule by mouth 3 (three) times daily. 01/16/16   [provider]  GLIPIZIDE XL 5 MG 24 hr tablet Take 1 tablet by mouth daily. 02/26/16   [provider]  guaiFENesin (MUCINEX) 600 MG 12 hr tablet Take 1 tablet (600 mg total) by mouth 2 (two) times daily. 07/17/16   Vaughan Basta, MD  HYDROcodone-homatropine [facility]) 5-1.5 MG/5ML syrup Take 5 mLs by mouth every 6 (six) hours as needed for cough.    [provider]  ipratropium-albuterol (DUONEB) 0.5-2.5 (3) MG/3ML SOLN Take 3 mLs by nebulization every 6 (six) hours as needed. 07/17/16   Vaughan Basta, MD  lacosamide (VIMPAT) 50 MG TABS tablet Take 1 tablet (50 mg total) by mouth 2 (two) times daily. 07/17/16   Vaughan Basta, MD  mometasone-formoterol (DULERA) 200-5 MCG/ACT AERO Inhale 2 puffs into the lungs 2 (two) times daily. 07/17/16   Vaughan Basta, MD  montelukast (SINGULAIR) 10 MG tablet Take 1 tablet by mouth at bedtime. 03/18/16   [provider]  mupirocin ointment (BACTROBAN) 2 % Place 1 application into the nose 2 (two) times daily. 07/17/16   Vaughan Basta, MD  naproxen (NAPROSYN) 500 MG tablet Take 500 mg by mouth 2 (two) times daily with a meal.    [provider]  omeprazole (PRILOSEC) 20 MG capsule Take  20 mg by mouth daily.    [provider]  potassium chloride (K-DUR) 10 MEQ tablet Take 1 tablet (10 mEq total) by mouth daily. 07/17/16   Vaughan Basta, MD  predniSONE (DELTASONE) 10 MG tablet Take 3 tablet on 07/18/16, 2 tab on 07/19/16, 1 tablet on 07/21/15. 07/17/16   Vaughan Basta, MD  rOPINIRole (REQUIP) 0.25 MG tablet Take 1 tablet (0.25 mg total) by mouth 3 (three) times daily. 03/24/16   Oswald Hillock, MD    Allergies Aspirin; Celebrex [celecoxib]; Ciprofloxacin; Codeine; Fosphenytoin; Levaquin [levofloxacin in d5w]; Levofloxacin; Lovastatin; Penicillins; Pravastatin; and Sulfa antibiotics  Family History  Problem Relation Age of Onset  . Lung cancer Mother   . CAD Father     Social History Social History  Substance Use Topics  . Smoking status: Current Every Day Smoker    Packs/day: 0.50  . Smokeless tobacco: Never Used  . Alcohol use No    Review of Systems  Constitutional: Negative  for fever. Eyes: Negative for visual changes. ENT: Negative for sore throat. Neck: No neck pain  Cardiovascular: Negative for chest pain. Respiratory: Negative for shortness of breath. Gastrointestinal: Negative for abdominal pain, vomiting or diarrhea. Genitourinary: Negative for dysuria. Musculoskeletal: Negative for back pain. Skin: Negative for rash. Neurological: Negative for weakness or numbness. + seizure and HA Psych: No SI or HI  ____________________________________________   PHYSICAL EXAM:  VITAL SIGNS: Vitals:   12/05/16 1038 12/05/16 1141  BP:  (!) 170/75  Pulse:  67  Resp:  18  Temp: 98.5 F (36.9 C) 98.7 F (37.1 C)   Constitutional: Alert and oriented x 3. Well appearing and in no apparent distress. HEENT:      Head: Normocephalic and atraumatic.         Eyes: Conjunctivae are normal. Sclera is non-icteric.       Mouth/Throat: Mucous membranes are moist.       Neck: Supple with no signs of meningismus. Cardiovascular: Regular rate  and rhythm. No murmurs, gallops, or rubs. 2+ symmetrical distal pulses are present in all extremities. No JVD. Respiratory: Normal respiratory effort. Lungs are clear to auscultation bilaterally. No wheezes, crackles, or rhonchi.  Gastrointestinal: Soft, non tender, and non distended with positive bowel sounds. No rebound or guarding. Musculoskeletal: Nontender with normal range of motion in all extremities. No edema, cyanosis, or erythema of extremities. Neurologic: Normal speech and language. A & O x3, PERRL, no nystagmus, CN II-XII intact, motor testing reveals good tone and bulk throughout. There is no evidence of pronator drift or dysmetria. Muscle strength is 5/5 throughout. Deep tendon reflexes are 2+ throughout with downgoing toes. Sensory examination is intact. Gait is normal. Skin: Skin is warm, dry and intact. No rash noted. Psychiatric: Mood and affect are normal. Speech and behavior are normal.  ____________________________________________   LABS (all labs ordered are listed, but only abnormal results are displayed)  Labs Reviewed - No data to display ____________________________________________  EKG  ED ECG REPORT I, Rudene Re, the attending physician, personally viewed and interpreted this ECG.  Sinus bradycardia, normal intervals, normal axis, no STE or depressions, diffuse T-wave flattening ,no evidence of HOCM, AV block, delta wave, ARVD, prolonged QTc, WPW.  ____________________________________________  RADIOLOGY  none  ____________________________________________   PROCEDURES  Procedure(s) performed: None Procedures Critical Care performed:  None ____________________________________________   INITIAL IMPRESSION / ASSESSMENT AND PLAN / ED COURSE  66 y.o. female with a history of seizure disorder who presents for evaluation after having a seizure in the setting of medication noncompliance. Patient is neurologically intact and back to her baseline.  Vitals are stable. I discussed patient with Dr. Manuella Ghazi, her neurologist who recommended giving her a dose of 100 mg IV Vimpat. I will also check labs to make sure patient doesn't have electrolyte abnormalities, dehydration, or an infection that could predispose her to have a seizure.   _________________________ 11:46 AM on 12/05/2016 ----------------------------------------- Patient is refusing lab, does not wish to have an IV placed for her medication. She accepted the oral dose of Vimpat. She wants to be discharged. The daughter is at the bedside and told me that she usually comes to the emergency room because the daughter brings her in every time she leaves AGAINST MEDICAL ADVICE and refuses treatment. I explained to the patient that being without her medication she can have multiple seizures, she can also developed status epilepticus which can cause significant neurological damage and potentially even kill her. Patient understands these complications and risks  and continues to wish to go home. I recommended that she follows up with her neurologist as soon as she can.  12/05/2016 at 11:48 AM:  The patient requested to leave.  I considered this to be leaving against medical advice. I personally discussed the following with them:   That they currently had a medical condition of epilepsy   1)  My proposed course of evaluation and treatment includes, but is not limited to, administration of antiepileptic medication, .  Benefits of staying include possible diagnosis or excluding of dehydration, electrolyte abnormalities, infection, which if identified early would lead to appropriate intervention in a timely manner lessening the burden of disability and death.  2) Risks of leaving before this had been completed include: misdiagnosis, worsening illness leading up to and including prolonged or permanent disability or death.  Specific risks pertinent, but not all inclusive, of their current medical condition  include but are not limited to multiple seizures, status epilepticus, neurological damage.  Despite this they stated they wanted to leave due to personal preference and refused further evaluation, treatment, or admission at this time.   They appeared clinically sober, were mentating appropriately, were free from distracting injury, had adequately controlled acute pain, appeared to have intact insight, judgment, and reason, and in my opinion had the capacity to make this decision.  Specifically, they were able to verbally state back in a coherent manner their current medical condition/current diagnosis, the proposed course of evaluation and/or treatment, and the risks, benefits, and alternatives of treatment versus leaving against medical advice.   They understand that they may return to seek medical attention here at ANY time they want.  I strongly advised them to return to the Emergency Department immediately if they experience any new or worsening symptoms that concern them, or simply if they reconsider continued evaluation and/or treatment as previously discussed.  This would be without any repercussions, though they understand they likely will need to wait again in the Emergency Department if other patients are in front of them, rather than being brought straight back.  They understood this is another advantage of staying, but still insisted upon leaving.  I recommended they follow-up with Dr. Manuella Ghazi at the earliest available opportunity/appointment for further evaluation and treatment.   The patient was discharged against medical advice.  They did accept written discharge instructions.        Pertinent labs & imaging results that were available during my care of the patient were reviewed by me and considered in my medical decision making (see chart for details).    ____________________________________________   FINAL CLINICAL IMPRESSION(S) / ED DIAGNOSES  Final diagnoses:  Seizure  ([place])  Non compliance w medication regimen      NEW MEDICATIONS STARTED DURING THIS VISIT:  New Prescriptions   No medications on file     Note:  This document was prepared using Dragon voice recognition software and may include unintentional dictation errors.    Alfred Levins, [place], MD 12/05/16 [phone]

## 2016-12-09 DIAGNOSIS — R000 Tachycardia, unspecified: Secondary | ICD-10-CM | POA: Diagnosis not present

## 2016-12-09 DIAGNOSIS — F17210 Nicotine dependence, cigarettes, uncomplicated: Secondary | ICD-10-CM | POA: Diagnosis not present

## 2016-12-09 DIAGNOSIS — G40909 Epilepsy, unspecified, not intractable, without status epilepticus: Secondary | ICD-10-CM | POA: Diagnosis not present

## 2016-12-09 DIAGNOSIS — J449 Chronic obstructive pulmonary disease, unspecified: Secondary | ICD-10-CM | POA: Diagnosis not present

## 2016-12-09 DIAGNOSIS — F329 Major depressive disorder, single episode, unspecified: Secondary | ICD-10-CM | POA: Diagnosis not present

## 2016-12-09 DIAGNOSIS — E1140 Type 2 diabetes mellitus with diabetic neuropathy, unspecified: Secondary | ICD-10-CM | POA: Diagnosis not present

## 2016-12-09 DIAGNOSIS — I2510 Atherosclerotic heart disease of native coronary artery without angina pectoris: Secondary | ICD-10-CM | POA: Diagnosis not present

## 2016-12-09 DIAGNOSIS — K7460 Unspecified cirrhosis of liver: Secondary | ICD-10-CM | POA: Diagnosis not present

## 2016-12-09 DIAGNOSIS — R42 Dizziness and giddiness: Secondary | ICD-10-CM | POA: Diagnosis not present

## 2016-12-09 DIAGNOSIS — J45901 Unspecified asthma with (acute) exacerbation: Secondary | ICD-10-CM | POA: Diagnosis not present

## 2016-12-09 DIAGNOSIS — K219 Gastro-esophageal reflux disease without esophagitis: Secondary | ICD-10-CM | POA: Diagnosis not present

## 2016-12-09 DIAGNOSIS — I10 Essential (primary) hypertension: Secondary | ICD-10-CM | POA: Diagnosis not present

## 2016-12-09 DIAGNOSIS — E119 Type 2 diabetes mellitus without complications: Secondary | ICD-10-CM | POA: Diagnosis not present

## 2016-12-11 DIAGNOSIS — E1140 Type 2 diabetes mellitus with diabetic neuropathy, unspecified: Secondary | ICD-10-CM | POA: Diagnosis not present

## 2016-12-11 DIAGNOSIS — K7460 Unspecified cirrhosis of liver: Secondary | ICD-10-CM | POA: Diagnosis not present

## 2016-12-11 DIAGNOSIS — K219 Gastro-esophageal reflux disease without esophagitis: Secondary | ICD-10-CM | POA: Diagnosis not present

## 2016-12-11 DIAGNOSIS — I2510 Atherosclerotic heart disease of native coronary artery without angina pectoris: Secondary | ICD-10-CM | POA: Diagnosis not present

## 2016-12-11 DIAGNOSIS — F17210 Nicotine dependence, cigarettes, uncomplicated: Secondary | ICD-10-CM | POA: Diagnosis not present

## 2016-12-11 DIAGNOSIS — F329 Major depressive disorder, single episode, unspecified: Secondary | ICD-10-CM | POA: Diagnosis not present

## 2016-12-11 DIAGNOSIS — I10 Essential (primary) hypertension: Secondary | ICD-10-CM | POA: Diagnosis not present

## 2016-12-11 DIAGNOSIS — J449 Chronic obstructive pulmonary disease, unspecified: Secondary | ICD-10-CM | POA: Diagnosis not present

## 2016-12-11 DIAGNOSIS — G40909 Epilepsy, unspecified, not intractable, without status epilepticus: Secondary | ICD-10-CM | POA: Diagnosis not present

## 2016-12-15 DIAGNOSIS — J449 Chronic obstructive pulmonary disease, unspecified: Secondary | ICD-10-CM | POA: Diagnosis not present

## 2016-12-15 DIAGNOSIS — E1140 Type 2 diabetes mellitus with diabetic neuropathy, unspecified: Secondary | ICD-10-CM | POA: Diagnosis not present

## 2016-12-15 DIAGNOSIS — I10 Essential (primary) hypertension: Secondary | ICD-10-CM | POA: Diagnosis not present

## 2016-12-15 DIAGNOSIS — K219 Gastro-esophageal reflux disease without esophagitis: Secondary | ICD-10-CM | POA: Diagnosis not present

## 2016-12-15 DIAGNOSIS — K7460 Unspecified cirrhosis of liver: Secondary | ICD-10-CM | POA: Diagnosis not present

## 2016-12-15 DIAGNOSIS — F329 Major depressive disorder, single episode, unspecified: Secondary | ICD-10-CM | POA: Diagnosis not present

## 2016-12-15 DIAGNOSIS — F17210 Nicotine dependence, cigarettes, uncomplicated: Secondary | ICD-10-CM | POA: Diagnosis not present

## 2016-12-15 DIAGNOSIS — G40909 Epilepsy, unspecified, not intractable, without status epilepticus: Secondary | ICD-10-CM | POA: Diagnosis not present

## 2016-12-15 DIAGNOSIS — I2510 Atherosclerotic heart disease of native coronary artery without angina pectoris: Secondary | ICD-10-CM | POA: Diagnosis not present

## 2016-12-22 DIAGNOSIS — K7460 Unspecified cirrhosis of liver: Secondary | ICD-10-CM | POA: Diagnosis not present

## 2016-12-22 DIAGNOSIS — E1140 Type 2 diabetes mellitus with diabetic neuropathy, unspecified: Secondary | ICD-10-CM | POA: Diagnosis not present

## 2016-12-22 DIAGNOSIS — I2510 Atherosclerotic heart disease of native coronary artery without angina pectoris: Secondary | ICD-10-CM | POA: Diagnosis not present

## 2016-12-22 DIAGNOSIS — G40909 Epilepsy, unspecified, not intractable, without status epilepticus: Secondary | ICD-10-CM | POA: Diagnosis not present

## 2016-12-22 DIAGNOSIS — F329 Major depressive disorder, single episode, unspecified: Secondary | ICD-10-CM | POA: Diagnosis not present

## 2016-12-22 DIAGNOSIS — I10 Essential (primary) hypertension: Secondary | ICD-10-CM | POA: Diagnosis not present

## 2016-12-22 DIAGNOSIS — F17210 Nicotine dependence, cigarettes, uncomplicated: Secondary | ICD-10-CM | POA: Diagnosis not present

## 2016-12-22 DIAGNOSIS — J449 Chronic obstructive pulmonary disease, unspecified: Secondary | ICD-10-CM | POA: Diagnosis not present

## 2016-12-22 DIAGNOSIS — K219 Gastro-esophageal reflux disease without esophagitis: Secondary | ICD-10-CM | POA: Diagnosis not present

## 2017-01-04 DIAGNOSIS — I2723 Pulmonary hypertension due to lung diseases and hypoxia: Secondary | ICD-10-CM | POA: Diagnosis not present

## 2017-01-04 DIAGNOSIS — J4520 Mild intermittent asthma, uncomplicated: Secondary | ICD-10-CM | POA: Diagnosis not present

## 2017-01-04 DIAGNOSIS — K210 Gastro-esophageal reflux disease with esophagitis: Secondary | ICD-10-CM | POA: Diagnosis not present

## 2017-01-04 DIAGNOSIS — I6990 Unspecified sequelae of unspecified cerebrovascular disease: Secondary | ICD-10-CM | POA: Diagnosis not present

## 2017-01-04 DIAGNOSIS — J449 Chronic obstructive pulmonary disease, unspecified: Secondary | ICD-10-CM | POA: Diagnosis not present

## 2017-01-07 DIAGNOSIS — I2510 Atherosclerotic heart disease of native coronary artery without angina pectoris: Secondary | ICD-10-CM | POA: Diagnosis not present

## 2017-01-07 DIAGNOSIS — I10 Essential (primary) hypertension: Secondary | ICD-10-CM | POA: Diagnosis not present

## 2017-01-07 DIAGNOSIS — K219 Gastro-esophageal reflux disease without esophagitis: Secondary | ICD-10-CM | POA: Diagnosis not present

## 2017-01-07 DIAGNOSIS — F329 Major depressive disorder, single episode, unspecified: Secondary | ICD-10-CM | POA: Diagnosis not present

## 2017-01-07 DIAGNOSIS — J449 Chronic obstructive pulmonary disease, unspecified: Secondary | ICD-10-CM | POA: Diagnosis not present

## 2017-01-07 DIAGNOSIS — E1140 Type 2 diabetes mellitus with diabetic neuropathy, unspecified: Secondary | ICD-10-CM | POA: Diagnosis not present

## 2017-01-07 DIAGNOSIS — G40909 Epilepsy, unspecified, not intractable, without status epilepticus: Secondary | ICD-10-CM | POA: Diagnosis not present

## 2017-01-07 DIAGNOSIS — F17210 Nicotine dependence, cigarettes, uncomplicated: Secondary | ICD-10-CM | POA: Diagnosis not present

## 2017-01-07 DIAGNOSIS — K7460 Unspecified cirrhosis of liver: Secondary | ICD-10-CM | POA: Diagnosis not present

## 2017-01-13 DIAGNOSIS — R000 Tachycardia, unspecified: Secondary | ICD-10-CM | POA: Diagnosis not present

## 2017-01-13 DIAGNOSIS — K210 Gastro-esophageal reflux disease with esophagitis: Secondary | ICD-10-CM | POA: Diagnosis not present

## 2017-01-13 DIAGNOSIS — I10 Essential (primary) hypertension: Secondary | ICD-10-CM | POA: Diagnosis not present

## 2017-01-13 DIAGNOSIS — J449 Chronic obstructive pulmonary disease, unspecified: Secondary | ICD-10-CM | POA: Diagnosis not present

## 2017-01-20 DIAGNOSIS — J449 Chronic obstructive pulmonary disease, unspecified: Secondary | ICD-10-CM | POA: Diagnosis not present

## 2017-01-20 DIAGNOSIS — G40909 Epilepsy, unspecified, not intractable, without status epilepticus: Secondary | ICD-10-CM | POA: Diagnosis not present

## 2017-01-20 DIAGNOSIS — F329 Major depressive disorder, single episode, unspecified: Secondary | ICD-10-CM | POA: Diagnosis not present

## 2017-01-20 DIAGNOSIS — K7460 Unspecified cirrhosis of liver: Secondary | ICD-10-CM | POA: Diagnosis not present

## 2017-01-20 DIAGNOSIS — I2510 Atherosclerotic heart disease of native coronary artery without angina pectoris: Secondary | ICD-10-CM | POA: Diagnosis not present

## 2017-01-20 DIAGNOSIS — I10 Essential (primary) hypertension: Secondary | ICD-10-CM | POA: Diagnosis not present

## 2017-01-20 DIAGNOSIS — F17210 Nicotine dependence, cigarettes, uncomplicated: Secondary | ICD-10-CM | POA: Diagnosis not present

## 2017-01-20 DIAGNOSIS — K219 Gastro-esophageal reflux disease without esophagitis: Secondary | ICD-10-CM | POA: Diagnosis not present

## 2017-01-20 DIAGNOSIS — E1140 Type 2 diabetes mellitus with diabetic neuropathy, unspecified: Secondary | ICD-10-CM | POA: Diagnosis not present

## 2017-02-04 DIAGNOSIS — K210 Gastro-esophageal reflux disease with esophagitis: Secondary | ICD-10-CM | POA: Diagnosis not present

## 2017-02-04 DIAGNOSIS — I6990 Unspecified sequelae of unspecified cerebrovascular disease: Secondary | ICD-10-CM | POA: Diagnosis not present

## 2017-02-04 DIAGNOSIS — J4520 Mild intermittent asthma, uncomplicated: Secondary | ICD-10-CM | POA: Diagnosis not present

## 2017-02-04 DIAGNOSIS — J449 Chronic obstructive pulmonary disease, unspecified: Secondary | ICD-10-CM | POA: Diagnosis not present

## 2017-02-04 DIAGNOSIS — I2723 Pulmonary hypertension due to lung diseases and hypoxia: Secondary | ICD-10-CM | POA: Diagnosis not present

## 2017-02-06 ENCOUNTER — Emergency Department: Payer: Medicare HMO

## 2017-02-06 ENCOUNTER — Emergency Department
Admission: EM | Admit: 2017-02-06 | Discharge: 2017-02-06 | Disposition: A | Discharge status: Home / Self Care | Payer: Medicare HMO | Attending: Emergency Medicine | Admitting: Emergency Medicine

## 2017-02-06 ENCOUNTER — Encounter: Payer: Self-pay | Admitting: Intensive Care

## 2017-02-06 ENCOUNTER — Other Ambulatory Visit: Payer: Self-pay

## 2017-02-06 DIAGNOSIS — J189 Pneumonia, unspecified organism: Secondary | ICD-10-CM | POA: Diagnosis not present

## 2017-02-06 DIAGNOSIS — Z7902 Long term (current) use of antithrombotics/antiplatelets: Secondary | ICD-10-CM | POA: Diagnosis not present

## 2017-02-06 DIAGNOSIS — J449 Chronic obstructive pulmonary disease, unspecified: Secondary | ICD-10-CM | POA: Insufficient documentation

## 2017-02-06 DIAGNOSIS — I10 Essential (primary) hypertension: Secondary | ICD-10-CM | POA: Diagnosis not present

## 2017-02-06 DIAGNOSIS — F17200 Nicotine dependence, unspecified, uncomplicated: Secondary | ICD-10-CM | POA: Insufficient documentation

## 2017-02-06 DIAGNOSIS — R0602 Shortness of breath: Secondary | ICD-10-CM | POA: Diagnosis not present

## 2017-02-06 DIAGNOSIS — Z79899 Other long term (current) drug therapy: Secondary | ICD-10-CM | POA: Diagnosis not present

## 2017-02-06 DIAGNOSIS — J45909 Unspecified asthma, uncomplicated: Secondary | ICD-10-CM | POA: Insufficient documentation

## 2017-02-06 DIAGNOSIS — R109 Unspecified abdominal pain: Secondary | ICD-10-CM | POA: Insufficient documentation

## 2017-02-06 DIAGNOSIS — R9431 Abnormal electrocardiogram [ECG] [EKG]: Secondary | ICD-10-CM | POA: Insufficient documentation

## 2017-02-06 DIAGNOSIS — R05 Cough: Secondary | ICD-10-CM | POA: Diagnosis not present

## 2017-02-06 DIAGNOSIS — J441 Chronic obstructive pulmonary disease with (acute) exacerbation: Secondary | ICD-10-CM | POA: Diagnosis not present

## 2017-02-06 DIAGNOSIS — Z8673 Personal history of transient ischemic attack (TIA), and cerebral infarction without residual deficits: Secondary | ICD-10-CM | POA: Insufficient documentation

## 2017-02-06 DIAGNOSIS — E876 Hypokalemia: Secondary | ICD-10-CM | POA: Diagnosis not present

## 2017-02-06 DIAGNOSIS — E119 Type 2 diabetes mellitus without complications: Secondary | ICD-10-CM | POA: Insufficient documentation

## 2017-02-06 LAB — CBC
HEMATOCRIT: 46.2 % (ref 35.0–47.0)
HEMOGLOBIN: 16 g/dL (ref 12.0–16.0)
MCH: 28.2 pg (ref 26.0–34.0)
MCHC: 34.6 g/dL (ref 32.0–36.0)
MCV: 81.5 fL (ref 80.0–100.0)
Platelets: 305 10*3/uL (ref 150–440)
RBC: 5.66 MIL/uL — ABNORMAL HIGH (ref 3.80–5.20)
RDW: 13.2 % (ref 11.5–14.5)
WBC: 17.7 10*3/uL — ABNORMAL HIGH (ref 3.6–11.0)

## 2017-02-06 LAB — HEPATIC FUNCTION PANEL
ALBUMIN: 3.5 g/dL (ref 3.5–5.0)
ALK PHOS: 134 U/L — AB (ref 38–126)
ALT: 14 U/L (ref 14–54)
AST: 25 U/L (ref 15–41)
BILIRUBIN DIRECT: 0.2 mg/dL (ref 0.1–0.5)
BILIRUBIN INDIRECT: 1.1 mg/dL — AB (ref 0.3–0.9)
BILIRUBIN TOTAL: 1.3 mg/dL — AB (ref 0.3–1.2)
Total Protein: 7.5 g/dL (ref 6.5–8.1)

## 2017-02-06 LAB — URINALYSIS, COMPLETE (UACMP) WITH MICROSCOPIC
Bacteria, UA: NONE SEEN
Glucose, UA: 50 mg/dL — AB
HGB URINE DIPSTICK: NEGATIVE
KETONES UR: NEGATIVE mg/dL
LEUKOCYTES UA: NEGATIVE
Nitrite: NEGATIVE
PH: 5 (ref 5.0–8.0)
Protein, ur: 30 mg/dL — AB
SPECIFIC GRAVITY, URINE: 1.025 (ref 1.005–1.030)

## 2017-02-06 LAB — BASIC METABOLIC PANEL
ANION GAP: 14 (ref 5–15)
BUN: 13 mg/dL (ref 6–20)
CHLORIDE: 91 mmol/L — AB (ref 101–111)
CO2: 32 mmol/L (ref 22–32)
Calcium: 9.2 mg/dL (ref 8.9–10.3)
Creatinine, Ser: 0.63 mg/dL (ref 0.44–1.00)
GFR calc Af Amer: >60 mL/min (ref >60)
GLUCOSE: 205 mg/dL — AB (ref 65–99)
POTASSIUM: 2.5 mmol/L — AB (ref 3.5–5.1)
Sodium: 137 mmol/L (ref 135–145)

## 2017-02-06 LAB — PROTIME-INR
INR: 1.07
Prothrombin Time: 13.9 seconds (ref 11.4–15.2)

## 2017-02-06 LAB — LIPASE, BLOOD: LIPASE: 21 U/L (ref 11–51)

## 2017-02-06 LAB — BRAIN NATRIURETIC PEPTIDE: B NATRIURETIC PEPTIDE 5: 50 pg/mL (ref 0.0–100.0)

## 2017-02-06 LAB — TROPONIN I
TROPONIN I: 0.03 ng/mL — AB (ref <0.03)
Troponin I: 0.03 ng/mL (ref <0.03)

## 2017-02-06 LAB — APTT: aPTT: 29 seconds (ref 24–36)

## 2017-02-06 MED ORDER — IOPAMIDOL (ISOVUE-300) INJECTION 61%
100.0000 mL | Freq: Once | INTRAVENOUS | Status: AC | PRN
  Administered 2017-02-06: 100 mL via INTRAVENOUS

## 2017-02-06 MED ORDER — POTASSIUM CHLORIDE CRYS ER 20 MEQ PO TBCR
40.0000 meq | EXTENDED_RELEASE_TABLET | Freq: Once | ORAL | Status: AC
  Administered 2017-02-06: 40 meq via ORAL
  Filled 2017-02-06: qty 2

## 2017-02-06 MED ORDER — POTASSIUM CHLORIDE 10 MEQ/100ML IV SOLN
10.0000 meq | Freq: Once | INTRAVENOUS | Status: AC
  Administered 2017-02-06: 10 meq via INTRAVENOUS
  Filled 2017-02-06: qty 100

## 2017-02-06 MED ORDER — DOXYCYCLINE HYCLATE 100 MG PO CAPS: 100.0000 mg | ORAL_CAPSULE | Freq: Two times a day (BID) | ORAL | 0 refills | Status: DC

## 2017-02-06 MED ORDER — CEFTRIAXONE SODIUM 1 G IJ SOLR
1.0000 g | Freq: Once | INTRAMUSCULAR | Status: AC
  Administered 2017-02-06: 1 g via INTRAVENOUS
  Filled 2017-02-06: qty 10

## 2017-02-06 MED ORDER — IPRATROPIUM-ALBUTEROL 0.5-2.5 (3) MG/3ML IN SOLN
3.0000 mL | Freq: Once | RESPIRATORY_TRACT | Status: AC
  Administered 2017-02-06: 3 mL via RESPIRATORY_TRACT

## 2017-02-06 MED ORDER — PREDNISONE 20 MG PO TABS: 60.0000 mg | ORAL_TABLET | Freq: Every day | ORAL | 0 refills | Status: AC

## 2017-02-06 MED ORDER — SODIUM CHLORIDE 0.9 % IV BOLUS (SEPSIS)
1000.0000 mL | Freq: Once | INTRAVENOUS | Status: AC
  Administered 2017-02-06: 1000 mL via INTRAVENOUS

## 2017-02-06 MED ORDER — AZITHROMYCIN 500 MG PO TABS
500.0000 mg | ORAL_TABLET | Freq: Once | ORAL | Status: AC
  Administered 2017-02-06: 500 mg via ORAL
  Filled 2017-02-06: qty 1

## 2017-02-06 MED ORDER — IPRATROPIUM-ALBUTEROL 0.5-2.5 (3) MG/3ML IN SOLN
3.0000 mL | Freq: Once | RESPIRATORY_TRACT | Status: AC
  Administered 2017-02-06: 3 mL via RESPIRATORY_TRACT
  Filled 2017-02-06: qty 9

## 2017-02-06 MED ORDER — METHYLPREDNISOLONE SODIUM SUCC 125 MG IJ SOLR
125.0000 mg | Freq: Once | INTRAMUSCULAR | Status: AC
  Administered 2017-02-06: 125 mg via INTRAVENOUS
  Filled 2017-02-06: qty 2

## 2017-02-06 NOTE — Discharge Instructions (Signed)
Follow-up with your doctor Monday. Follow-up with cardiology in one week. Return to the emergency room if you have worsening shortness of breath, chest pain, fever, or any new symptoms concerning to you.

## 2017-02-06 NOTE — ED Triage Notes (Addendum)
Patient presents today with c/o SOB, cough, and chest pressure over 1 1/2 weeks. Oxygen 87% on RA. Patient placed on 2L in triage. Reports normally wears 2L O2 at night only. Reports fever last night. C/o emesis and unable to eat. Patient takes plavix daily. Pt is deaf and uses sign language. Refused to use sign language with our interpretor on a stick.

## 2017-02-06 NOTE — ED Provider Notes (Addendum)
[facility] Emergency Department Provider Note  ____________________________________________  Time seen: Approximately 11:49 AM  I have reviewed the triage vital signs and the nursing notes.   HISTORY  Chief Complaint Shortness of Breath and Cough   HPI [name] is a 66 y.o. female with a history of COPD on 2 L nasal cannula at bedtime, Nash cirrhosis, asthma, diabetes, hypertension, seizure disorder, TIA who presents for evaluation of shortness of breath. Patient reports 10 days of progressively worsening shortness of breath and wheezing. She has been using her inhalers at home with some relief of her symptoms. Today the shortness of breath got worse. She has had chills and nausea. No fever, no vomiting or diarrhea. She denies chest pain but endorses intermittent chest tightness when her shortness of breath gets too bad. She has had a cough productive of yellow sputum. She is also complaining of intermittent right-sided abdominal pain that she has had for 2 days. She describes it as sharp, moderate, intermittent, lasting seconds to minutes at a time. Patient has had a cholecystectomy many years ago. She still has her appendix. No dysuria, no constipation.  Past Medical History:  Diagnosis Date  . Asthma   . Cirrhosis, non-alcoholic ([place])   . COPD (chronic obstructive pulmonary disease) ([place])   . Deaf   . Depression   . Diabetes mellitus without complication ([place])   . GERD (gastroesophageal reflux disease)   . Heart murmur   . Hepatitis   . Hypertension   . Lymph node disorder    arm  . Neuropathy   . On home oxygen therapy    hs  . Orthopnea   . RLS (restless legs syndrome)   . Seizures ([place])   . Shortness of breath dyspnea   . Sleep apnea   . Stroke [facility])    tia    Patient Active Problem List   Diagnosis Date Noted  . GERD (gastroesophageal reflux disease) 03/23/2016  . Depression 03/23/2016  . Convulsions/seizures ([place]) 03/22/2016    . DM (diabetes mellitus), type 2 ([place]) 03/22/2016  . Essential hypertension 03/22/2016  . Neuropathy 03/22/2016  . Convulsions ([place]) 03/22/2016  . COPD exacerbation ([place]) 03/31/2015  . Acute respiratory failure with hypoxia ([place]) 03/29/2015    Past Surgical History:  Procedure Laterality Date  . CATARACT EXTRACTION W/PHACO Right 11/23/2014   Procedure: CATARACT EXTRACTION PHACO AND INTRAOCULAR LENS PLACEMENT (IOC);  Surgeon: Lyla Glassing, MD;  Location: [facility] ORS;  Service: Ophthalmology;  Laterality: Right;  . CATARACT EXTRACTION W/PHACO Left 12/14/2014   Procedure: CATARACT EXTRACTION PHACO AND INTRAOCULAR LENS PLACEMENT (IOC);  Surgeon: Lyla Glassing, MD;  Location: [facility] ORS;  Service: Ophthalmology;  Laterality: Left;  US:01:16.6 AP:15.8 CDE:12.14  . CESAREAN SECTION    . CHOLECYSTECTOMY    . KNEE ARTHROPLASTY    . THUMB ARTHROSCOPY    . TONSILLECTOMY    . TYMPANOPLASTY     muliple    Prior to Admission medications   Medication Sig Start Date End Date Taking? Authorizing Provider  citalopram (CELEXA) 20 MG tablet Take 1 tablet (20 mg total) by mouth daily. 07/17/16  Yes Vaughan Basta, MD  clopidogrel (PLAVIX) 75 MG tablet Take 1 tablet (75 mg total) by mouth daily. 07/17/16  Yes Vaughan Basta, MD  diltiazem (DILACOR XR) 180 MG 24 hr capsule Take 180 mg by mouth daily.   Yes [provider]  furosemide (LASIX) 40 MG tablet Take 1 tablet (40 mg total) by mouth daily. 07/17/16  Yes Vaughan Basta, MD  gabapentin (NEURONTIN) 300 MG capsule Take 1 capsule by mouth 3 (three) times daily. 01/16/16  Yes [provider]  GLIPIZIDE XL 5 MG 24 hr tablet Take 1 tablet by mouth daily. 02/26/16  Yes [provider]  lacosamide (VIMPAT) 50 MG TABS tablet Take 1 tablet (50 mg total) by mouth 2 (two) times daily. 07/17/16  Yes Vaughan Basta, MD  losartan-hydrochlorothiazide (HYZAAR) 50-12.5 MG tablet Take 1 tablet by mouth daily.   Yes  [provider]  montelukast (SINGULAIR) 10 MG tablet Take 1 tablet by mouth at bedtime. 03/18/16  Yes [provider]  omeprazole (PRILOSEC) 20 MG capsule Take 20 mg by mouth daily.   Yes [provider]  albuterol (PROVENTIL HFA;VENTOLIN HFA) 108 (90 BASE) MCG/ACT inhaler Inhale 2 puffs into the lungs every 4 (four) hours as needed for wheezing or shortness of breath.    [provider]  albuterol (PROVENTIL) (2.5 MG/3ML) 0.083% nebulizer solution Take 3 mLs (2.5 mg total) by nebulization every 4 (four) hours as needed for wheezing or shortness of breath. 07/17/16   Vaughan Basta, MD  amLODipine (NORVASC) 10 MG tablet Take 1 tablet (10 mg total) by mouth daily. Patient not taking: Reported on 02/06/2017 07/17/16   Vaughan Basta, MD  dextromethorphan (DELSYM) 30 MG/5ML liquid Take 5 mLs (30 mg total) by mouth 2 (two) times daily. Patient not taking: Reported on 02/06/2017 07/17/16   Vaughan Basta, MD  doxycycline (VIBRAMYCIN) 100 MG capsule Take 1 capsule (100 mg total) by mouth 2 (two) times daily. 02/06/17 02/16/17  Rudene Re, MD  enalapril (VASOTEC) 20 MG tablet Take 1 tablet (20 mg total) by mouth daily. Patient not taking: Reported on 02/06/2017 07/17/16   Vaughan Basta, MD  EPINEPHrine 0.3 mg/0.3 mL IJ SOAJ injection Inject into the muscle once.    [provider]  guaiFENesin (MUCINEX) 600 MG 12 hr tablet Take 1 tablet (600 mg total) by mouth 2 (two) times daily. Patient not taking: Reported on 02/06/2017 07/17/16   Vaughan Basta, MD  ipratropium-albuterol (DUONEB) 0.5-2.5 (3) MG/3ML SOLN Take 3 mLs by nebulization every 6 (six) hours as needed. Patient not taking: Reported on 02/06/2017 07/17/16   Vaughan Basta, MD  mometasone-formoterol [facility]) 200-5 MCG/ACT AERO Inhale 2 puffs into the lungs 2 (two) times daily. 07/17/16   Vaughan Basta, MD  mupirocin ointment (BACTROBAN) 2 % Place 1  application into the nose 2 (two) times daily. Patient not taking: Reported on 02/06/2017 07/17/16   Vaughan Basta, MD  potassium chloride (K-DUR) 10 MEQ tablet Take 1 tablet (10 mEq total) by mouth daily. Patient not taking: Reported on 02/06/2017 07/17/16   Vaughan Basta, MD  predniSONE (DELTASONE) 20 MG tablet Take 3 tablets (60 mg total) by mouth daily. 02/06/17 02/10/17  Rudene Re, MD  rOPINIRole (REQUIP) 0.25 MG tablet Take 1 tablet (0.25 mg total) by mouth 3 (three) times daily. 03/24/16   Oswald Hillock, MD    Allergies Aspirin; Celebrex [celecoxib]; Ciprofloxacin; Codeine; Fosphenytoin; Levaquin [levofloxacin in d5w]; Levofloxacin; Lovastatin; Penicillins; Pravastatin; and Sulfa antibiotics  Family History  Problem Relation Age of Onset  . Lung cancer Mother   . CAD Father     Social History Social History  Substance Use Topics  . Smoking status: Current Every Day Smoker    Packs/day: 0.50  . Smokeless tobacco: Never Used  . Alcohol use No    Review of Systems  Constitutional: Negative for fever. + chills Eyes: Negative for  visual changes. ENT: Negative for sore throat. Neck: No neck pain  Cardiovascular: Negative for chest pain. Respiratory: + shortness of breath, cough Gastrointestinal: + R sided abdominal pain and nausea. No vomiting or diarrhea. Genitourinary: Negative for dysuria. Musculoskeletal: Negative for back pain. Skin: Negative for rash. Neurological: Negative for headaches, weakness or numbness. Psych: No SI or HI  ____________________________________________   PHYSICAL EXAM:  VITAL SIGNS: ED Triage Vitals  Enc Vitals Group     BP 02/06/17 1050 136/67     Pulse Rate 02/06/17 1050 83     Resp 02/06/17 1050 18     Temp 02/06/17 1050 99.2 F (37.3 C)     Temp Source 02/06/17 1050 Oral     SpO2 --      Weight 02/06/17 1056 155 lb (70.3 kg)     Height 02/06/17 1056 5' 3" (1.6 m)     Head Circumference --      Peak Flow --       Pain Score 02/06/17 1050 8     Pain Loc --      Pain Edu? --      Excl. in [place]? --     Constitutional: Alert and oriented. Well appearing and in no apparent distress. HEENT:      Head: Normocephalic and atraumatic.         Eyes: Conjunctivae are normal. Sclera is non-icteric.       Mouth/Throat: Mucous membranes are moist.       Neck: Supple with no signs of meningismus. Cardiovascular: Regular rate and rhythm. No murmurs, gallops, or rubs. 2+ symmetrical distal pulses are present in all extremities. No JVD. Respiratory: Normal respiratory effort. Patient with a decreased air movement and coarse rhonchi mostly on the right, clear with good air movement on the left. No wheezing.  Gastrointestinal: Soft, ttp over the right quadrants, and non distended with positive bowel sounds. No rebound or guarding. Musculoskeletal: Nontender with normal range of motion in all extremities. No edema, cyanosis, or erythema of extremities. Neurologic: Normal speech and language. Face is symmetric. Moving all extremities. No gross focal neurologic deficits are appreciated. Skin: Skin is warm, dry and intact. No rash noted. Psychiatric: Mood and affect are normal. Speech and behavior are normal.  ____________________________________________   LABS (all labs ordered are listed, but only abnormal results are displayed)  Labs Reviewed  BASIC METABOLIC PANEL - Abnormal; Notable for the following:       Result Value   Potassium 2.5 (*)    Chloride 91 (*)    Glucose, Bld 205 (*)    All other components within normal limits  CBC - Abnormal; Notable for the following:    WBC 17.7 (*)    RBC 5.66 (*)    All other components within normal limits  TROPONIN I - Abnormal; Notable for the following:    Troponin I 0.03 (*)    All other components within normal limits  URINALYSIS, COMPLETE (UACMP) WITH MICROSCOPIC - Abnormal; Notable for the following:    Color, Urine AMBER (*)    APPearance HAZY (*)     Glucose, UA 50 (*)    Bilirubin Urine SMALL (*)    Protein, ur 30 (*)    Squamous Epithelial / LPF 6-30 (*)    All other components within normal limits  HEPATIC FUNCTION PANEL - Abnormal; Notable for the following:    Alkaline Phosphatase 134 (*)    Total Bilirubin 1.3 (*)    Indirect Bilirubin 1.1 (*)  All other components within normal limits  TROPONIN I - Abnormal; Notable for the following:    Troponin I 0.03 (*)    All other components within normal limits  URINE CULTURE  BRAIN NATRIURETIC PEPTIDE  PROTIME-INR  APTT  LIPASE, BLOOD   ____________________________________________  EKG  -ED ECG REPORT I, Rudene Re, the attending physician, personally viewed and interpreted this ECG.  Normal sinus rhythm, occasional PACs, rate of 83, normal intervals, normal axis, ST depressions on inferior and lateral leads with no ST elevation. These depressions are new when compared to prior.  3:28 - Normal sinus rhythm, rate of 87, normal PR and QRS intervals, prolonged QTC, normal axis, ST depressions on inferior and lateral leads, no ST elevation, unchanged from prior. ____________________________________________  RADIOLOGY  CXR: negative  CT a/p: Consolidation in the lingula. Insert pneumonia. Followup PA and lateral chest X-ray is recommended in 3-4 weeks following trial of antibiotic therapy to ensure resolution and exclude underlying malignancy.  Diffuse hepatic steatosis. ____________________________________________   PROCEDURES  Procedure(s) performed: None Procedures Critical Care performed: yes  CRITICAL CARE Performed by: Rudene Re  ?  Total critical care time: 40 min  Critical care time was exclusive of separately billable procedures and treating other patients.  Critical care was necessary to treat or prevent imminent or life-threatening deterioration.  Critical care was time spent personally by me on the following activities: development  of treatment plan with patient and/or surrogate as well as nursing, discussions with consultants, evaluation of patient's response to treatment, examination of patient, obtaining history from patient or surrogate, ordering and performing treatments and interventions, ordering and review of laboratory studies, ordering and review of radiographic studies, pulse oximetry and re-evaluation of patient's condition.  ____________________________________________   INITIAL IMPRESSION / ASSESSMENT AND PLAN / ED COURSE  66 y.o. female with a history of COPD on 2 L nasal cannula at bedtime, Nash cirrhosis, asthma, diabetes, hypertension, seizure disorder, TIA who presents for evaluation of shortness of breath, cough, chills, nausea for 10 days. Patient has normal work of breathing, satting well on 2 L nasal cannula, she has coarse rhonchi with decreased air movement on the right, elevated white count, and low-grade temperature of 99.11F, clinically concerning for pneumonia with overlying COPD exacerbation. We'll treat with a DuoNeb treatments, Solu-Medrol, and antibiotics. Patient does have ST depressions on EKG which are new when compared to prior. We'll cycle her cardiac enzymes. Patient has no chest pain at this time. She has had intermittent chest tightness over the course of the last week. Patient allergic to ASA.  Clinical Course as of Feb 07 1548  Fri Feb 06, 2017  1316 Workup concerning for pneumonia with leukocytosis of 17,000. Potassium low at 2.5 which was is being supplemented IV and by mouth. Recommended admission to the hospital due to new ST depressions on EKG and a troponin of 0.03 however patient tells me that she has to go to her sister's funeral tomorrow and therefore cannot stay in the hospital. I was able to convince patient to stay for a second cardiac enzyme and EKG. I recommended she return to the emergency room as soon as she can or sees her doctor first thing Monday morning for close  follow-up. In the meantime will treat patient with Rocephin and azithromycin for community-acquired pneumonia.  [CV]    Clinical Course User Index [CV] Alfred Levins, [place], MD    _________________________ 3:28 PM on 02/06/2017 -----------------------------------------  Second troponin is unchanged at 0.03. Repeat EKG with no  changes when compared to initial one. Patient remains stable and continues to request discharge. She will be discharged on doxycycline only  for PNA, steroids and albuterol. Another reason why I recommended admission is due to the fact that I wish I could provide patient with double coverage for her pneumonia but due to her allergies this is the only oral medication that she is able to receive. Recommended close follow-up with primary care doctor Monday or return to the emergency room for further management. We'll refer patient to cardiology for follow up with abnormal EKG. Recommended she return to the emergency room as she has worsening shortness of breath, fever, nausea vomiting, or chest pain. Patient will return if the symptoms develop.  Pertinent labs & imaging results that were available during my care of the patient were reviewed by me and considered in my medical decision making (see chart for details).    ____________________________________________   FINAL CLINICAL IMPRESSION(S) / ED DIAGNOSES  Final diagnoses:  Community acquired pneumonia, unspecified laterality  COPD exacerbation ([place])  Hypokalemia  Abnormal EKG      NEW MEDICATIONS STARTED DURING THIS VISIT:  New Prescriptions   DOXYCYCLINE (VIBRAMYCIN) 100 MG CAPSULE    Take 1 capsule (100 mg total) by mouth 2 (two) times daily.   PREDNISONE (DELTASONE) 20 MG TABLET    Take 3 tablets (60 mg total) by mouth daily.     Note:  This document was prepared using Dragon voice recognition software and may include unintentional dictation errors.    Alfred Levins, [place], MD 02/06/17 [place], [place], [place] 02/06/17 [phone]

## 2017-02-08 LAB — URINE CULTURE: Culture: <10000 — AB

## 2017-02-09 ENCOUNTER — Telehealth: Payer: Self-pay

## 2017-02-09 NOTE — Telephone Encounter (Signed)
Tried to call patient to schedule ED fu seen on 02/06/17 for SOB  No answer no vm  Will try again at a later time

## 2017-02-11 ENCOUNTER — Emergency Department: Payer: Medicare Other

## 2017-02-11 ENCOUNTER — Inpatient Hospital Stay
Admission: EM | Admit: 2017-02-11 | Discharge: 2017-02-12 | DRG: 871 | Disposition: A | Discharge status: Home / Self Care | Payer: Medicare Other | Attending: Internal Medicine | Admitting: Internal Medicine

## 2017-02-11 ENCOUNTER — Other Ambulatory Visit: Payer: Self-pay

## 2017-02-11 DIAGNOSIS — J441 Chronic obstructive pulmonary disease with (acute) exacerbation: Secondary | ICD-10-CM | POA: Diagnosis present

## 2017-02-11 DIAGNOSIS — Z9981 Dependence on supplemental oxygen: Secondary | ICD-10-CM | POA: Diagnosis not present

## 2017-02-11 DIAGNOSIS — G2581 Restless legs syndrome: Secondary | ICD-10-CM | POA: Diagnosis present

## 2017-02-11 DIAGNOSIS — K7460 Unspecified cirrhosis of liver: Secondary | ICD-10-CM | POA: Diagnosis not present

## 2017-02-11 DIAGNOSIS — R0902 Hypoxemia: Secondary | ICD-10-CM | POA: Diagnosis not present

## 2017-02-11 DIAGNOSIS — Z886 Allergy status to analgesic agent status: Secondary | ICD-10-CM

## 2017-02-11 DIAGNOSIS — J449 Chronic obstructive pulmonary disease, unspecified: Secondary | ICD-10-CM | POA: Diagnosis not present

## 2017-02-11 DIAGNOSIS — Z881 Allergy status to other antibiotic agents status: Secondary | ICD-10-CM

## 2017-02-11 DIAGNOSIS — E872 Acidosis: Secondary | ICD-10-CM | POA: Diagnosis not present

## 2017-02-11 DIAGNOSIS — Z880 Allergy status to penicillin: Secondary | ICD-10-CM

## 2017-02-11 DIAGNOSIS — K219 Gastro-esophageal reflux disease without esophagitis: Secondary | ICD-10-CM | POA: Diagnosis present

## 2017-02-11 DIAGNOSIS — Z961 Presence of intraocular lens: Secondary | ICD-10-CM | POA: Diagnosis present

## 2017-02-11 DIAGNOSIS — E119 Type 2 diabetes mellitus without complications: Secondary | ICD-10-CM | POA: Diagnosis not present

## 2017-02-11 DIAGNOSIS — A419 Sepsis, unspecified organism: Secondary | ICD-10-CM | POA: Diagnosis not present

## 2017-02-11 DIAGNOSIS — Z7984 Long term (current) use of oral hypoglycemic drugs: Secondary | ICD-10-CM

## 2017-02-11 DIAGNOSIS — Z888 Allergy status to other drugs, medicaments and biological substances status: Secondary | ICD-10-CM

## 2017-02-11 DIAGNOSIS — Z7902 Long term (current) use of antithrombotics/antiplatelets: Secondary | ICD-10-CM

## 2017-02-11 DIAGNOSIS — Z8673 Personal history of transient ischemic attack (TIA), and cerebral infarction without residual deficits: Secondary | ICD-10-CM

## 2017-02-11 DIAGNOSIS — J440 Chronic obstructive pulmonary disease with acute lower respiratory infection: Secondary | ICD-10-CM | POA: Diagnosis present

## 2017-02-11 DIAGNOSIS — E876 Hypokalemia: Secondary | ICD-10-CM | POA: Diagnosis not present

## 2017-02-11 DIAGNOSIS — F17210 Nicotine dependence, cigarettes, uncomplicated: Secondary | ICD-10-CM | POA: Diagnosis present

## 2017-02-11 DIAGNOSIS — R0602 Shortness of breath: Secondary | ICD-10-CM | POA: Diagnosis not present

## 2017-02-11 DIAGNOSIS — F329 Major depressive disorder, single episode, unspecified: Secondary | ICD-10-CM | POA: Diagnosis not present

## 2017-02-11 DIAGNOSIS — Z79899 Other long term (current) drug therapy: Secondary | ICD-10-CM

## 2017-02-11 DIAGNOSIS — J189 Pneumonia, unspecified organism: Secondary | ICD-10-CM | POA: Diagnosis not present

## 2017-02-11 DIAGNOSIS — J129 Viral pneumonia, unspecified: Secondary | ICD-10-CM | POA: Diagnosis not present

## 2017-02-11 DIAGNOSIS — I10 Essential (primary) hypertension: Secondary | ICD-10-CM | POA: Diagnosis present

## 2017-02-11 DIAGNOSIS — G4730 Sleep apnea, unspecified: Secondary | ICD-10-CM | POA: Diagnosis present

## 2017-02-11 DIAGNOSIS — Z9049 Acquired absence of other specified parts of digestive tract: Secondary | ICD-10-CM

## 2017-02-11 DIAGNOSIS — Z885 Allergy status to narcotic agent status: Secondary | ICD-10-CM

## 2017-02-11 DIAGNOSIS — H9190 Unspecified hearing loss, unspecified ear: Secondary | ICD-10-CM | POA: Diagnosis present

## 2017-02-11 DIAGNOSIS — R079 Chest pain, unspecified: Secondary | ICD-10-CM | POA: Diagnosis not present

## 2017-02-11 DIAGNOSIS — R000 Tachycardia, unspecified: Secondary | ICD-10-CM | POA: Diagnosis not present

## 2017-02-11 LAB — PROCALCITONIN: Procalcitonin: <0.1 ng/mL

## 2017-02-11 LAB — BASIC METABOLIC PANEL
Anion gap: 15 (ref 5–15)
BUN: 12 mg/dL (ref 6–20)
CHLORIDE: 92 mmol/L — AB (ref 101–111)
CO2: 33 mmol/L — AB (ref 22–32)
CREATININE: 0.71 mg/dL (ref 0.44–1.00)
Calcium: 9.2 mg/dL (ref 8.9–10.3)
GFR calc non Af Amer: >60 mL/min (ref >60)
Glucose, Bld: 141 mg/dL — ABNORMAL HIGH (ref 65–99)
POTASSIUM: 2.5 mmol/L — AB (ref 3.5–5.1)
Sodium: 140 mmol/L (ref 135–145)

## 2017-02-11 LAB — CBC
HEMATOCRIT: 46.7 % (ref 35.0–47.0)
HEMOGLOBIN: 15.9 g/dL (ref 12.0–16.0)
MCH: 27.8 pg (ref 26.0–34.0)
MCHC: 34.2 g/dL (ref 32.0–36.0)
MCV: 81.5 fL (ref 80.0–100.0)
Platelets: 337 10*3/uL (ref 150–440)
RBC: 5.73 MIL/uL — AB (ref 3.80–5.20)
RDW: 13.4 % (ref 11.5–14.5)
WBC: 14.7 10*3/uL — ABNORMAL HIGH (ref 3.6–11.0)

## 2017-02-11 LAB — GLUCOSE, CAPILLARY
Glucose-Capillary: 470 mg/dL — ABNORMAL HIGH (ref 65–99)
Glucose-Capillary: 475 mg/dL — ABNORMAL HIGH (ref 65–99)
Glucose-Capillary: 527 mg/dL (ref 65–99)

## 2017-02-11 LAB — MAGNESIUM: Magnesium: 1.4 mg/dL — ABNORMAL LOW (ref 1.7–2.4)

## 2017-02-11 LAB — LACTIC ACID, PLASMA: Lactic Acid, Venous: 2.1 mmol/L (ref 0.5–1.9)

## 2017-02-11 LAB — APTT: aPTT: 30 s (ref 24–36)

## 2017-02-11 LAB — PROTIME-INR
INR: 1.08
Prothrombin Time: 14 seconds (ref 11.4–15.2)

## 2017-02-11 LAB — TROPONIN I: Troponin I: <0.03 ng/mL (ref <0.03)

## 2017-02-11 LAB — GLUCOSE, RANDOM: GLUCOSE: 598 mg/dL — AB (ref 65–99)

## 2017-02-11 MED ORDER — INSULIN ASPART 100 UNIT/ML ~~LOC~~ SOLN
0.0000 [IU] | Freq: Three times a day (TID) | SUBCUTANEOUS | Status: DC
  Filled 2017-02-11: qty 1

## 2017-02-11 MED ORDER — MOMETASONE FURO-FORMOTEROL FUM 200-5 MCG/ACT IN AERO
2.0000 | INHALATION_SPRAY | Freq: Two times a day (BID) | RESPIRATORY_TRACT | Status: DC
  Administered 2017-02-11 – 2017-02-12 (×2): 2 via RESPIRATORY_TRACT
  Filled 2017-02-11: qty 8.8

## 2017-02-11 MED ORDER — ENOXAPARIN SODIUM 40 MG/0.4ML ~~LOC~~ SOLN
40.0000 mg | SUBCUTANEOUS | Status: DC
  Administered 2017-02-11: 40 mg via SUBCUTANEOUS
  Filled 2017-02-11: qty 0.4

## 2017-02-11 MED ORDER — DEXTROMETHORPHAN POLISTIREX ER 30 MG/5ML PO SUER
30.0000 mg | Freq: Two times a day (BID) | ORAL | Status: DC
  Administered 2017-02-11 – 2017-02-12 (×2): 30 mg via ORAL
  Filled 2017-02-11 (×3): qty 5

## 2017-02-11 MED ORDER — GABAPENTIN 300 MG PO CAPS
300.0000 mg | ORAL_CAPSULE | Freq: Three times a day (TID) | ORAL | Status: DC
  Administered 2017-02-11 – 2017-02-12 (×2): 300 mg via ORAL
  Filled 2017-02-11 (×3): qty 1

## 2017-02-11 MED ORDER — METHYLPREDNISOLONE SODIUM SUCC 125 MG IJ SOLR
125.0000 mg | Freq: Once | INTRAMUSCULAR | Status: DC
  Filled 2017-02-11: qty 2

## 2017-02-11 MED ORDER — ACETAMINOPHEN 650 MG RE SUPP
650.0000 mg | Freq: Four times a day (QID) | RECTAL | Status: DC | PRN
Start: 2017-02-11 — End: 2017-02-12

## 2017-02-11 MED ORDER — LACOSAMIDE 50 MG PO TABS
50.0000 mg | ORAL_TABLET | Freq: Two times a day (BID) | ORAL | Status: DC
  Administered 2017-02-11 – 2017-02-12 (×2): 50 mg via ORAL
  Filled 2017-02-11 (×2): qty 1

## 2017-02-11 MED ORDER — CLOPIDOGREL BISULFATE 75 MG PO TABS
75.0000 mg | ORAL_TABLET | Freq: Every day | ORAL | Status: DC
  Administered 2017-02-12: 75 mg via ORAL
  Filled 2017-02-11: qty 1

## 2017-02-11 MED ORDER — ALBUTEROL SULFATE (2.5 MG/3ML) 0.083% IN NEBU: 2.5000 mg | INHALATION_SOLUTION | RESPIRATORY_TRACT | Status: DC | PRN

## 2017-02-11 MED ORDER — INSULIN ASPART 100 UNIT/ML ~~LOC~~ SOLN: 0.0000 [IU] | Freq: Every day | SUBCUTANEOUS | Status: DC

## 2017-02-11 MED ORDER — BISACODYL 5 MG PO TBEC: 5.0000 mg | DELAYED_RELEASE_TABLET | Freq: Every day | ORAL | Status: DC | PRN

## 2017-02-11 MED ORDER — VANCOMYCIN HCL IN DEXTROSE 750-5 MG/150ML-% IV SOLN
750.0000 mg | Freq: Two times a day (BID) | INTRAVENOUS | Status: DC
  Administered 2017-02-12: 750 mg via INTRAVENOUS
  Filled 2017-02-11 (×2): qty 150

## 2017-02-11 MED ORDER — ACETAMINOPHEN 325 MG PO TABS: 650.0000 mg | ORAL_TABLET | Freq: Four times a day (QID) | ORAL | Status: DC | PRN

## 2017-02-11 MED ORDER — POTASSIUM CHLORIDE CRYS ER 10 MEQ PO TBCR
10.0000 meq | EXTENDED_RELEASE_TABLET | Freq: Every day | ORAL | Status: DC
  Administered 2017-02-11 – 2017-02-12 (×2): 10 meq via ORAL
  Filled 2017-02-11 (×2): qty 1

## 2017-02-11 MED ORDER — POTASSIUM CHLORIDE CRYS ER 20 MEQ PO TBCR
40.0000 meq | EXTENDED_RELEASE_TABLET | Freq: Once | ORAL | Status: DC
  Filled 2017-02-11: qty 2

## 2017-02-11 MED ORDER — ORAL CARE MOUTH RINSE
15.0000 mL | Freq: Two times a day (BID) | OROMUCOSAL | Status: DC
  Administered 2017-02-12: 15 mL via OROMUCOSAL

## 2017-02-11 MED ORDER — INSULIN ASPART 100 UNIT/ML ~~LOC~~ SOLN
10.0000 [IU] | Freq: Once | SUBCUTANEOUS | Status: AC
  Administered 2017-02-11: 10 [IU] via SUBCUTANEOUS
  Filled 2017-02-11: qty 1

## 2017-02-11 MED ORDER — POTASSIUM CHLORIDE IN NACL 40-0.9 MEQ/L-% IV SOLN
INTRAVENOUS | Status: DC
  Administered 2017-02-11: 75 mL/h via INTRAVENOUS
  Filled 2017-02-11 (×3): qty 1000

## 2017-02-11 MED ORDER — IPRATROPIUM-ALBUTEROL 0.5-2.5 (3) MG/3ML IN SOLN
3.0000 mL | Freq: Once | RESPIRATORY_TRACT | Status: DC
  Filled 2017-02-11: qty 3

## 2017-02-11 MED ORDER — MUPIROCIN 2 % EX OINT
1.0000 "application " | TOPICAL_OINTMENT | Freq: Two times a day (BID) | CUTANEOUS | Status: DC
  Administered 2017-02-12: 1 via NASAL
  Filled 2017-02-11: qty 22

## 2017-02-11 MED ORDER — PANTOPRAZOLE SODIUM 40 MG PO TBEC
40.0000 mg | DELAYED_RELEASE_TABLET | Freq: Every day | ORAL | Status: DC
  Administered 2017-02-12: 40 mg via ORAL
  Filled 2017-02-11: qty 1

## 2017-02-11 MED ORDER — ONDANSETRON HCL 4 MG PO TABS: 4.0000 mg | ORAL_TABLET | Freq: Four times a day (QID) | ORAL | Status: DC | PRN

## 2017-02-11 MED ORDER — SODIUM CHLORIDE 0.9 % IV BOLUS (SEPSIS): 1000.0000 mL | Freq: Once | INTRAVENOUS | Status: DC

## 2017-02-11 MED ORDER — MONTELUKAST SODIUM 10 MG PO TABS: 10.0000 mg | ORAL_TABLET | Freq: Every day | ORAL | Status: DC

## 2017-02-11 MED ORDER — DOXYCYCLINE HYCLATE 100 MG PO TABS
100.0000 mg | ORAL_TABLET | Freq: Two times a day (BID) | ORAL | Status: DC
  Administered 2017-02-11 – 2017-02-12 (×2): 100 mg via ORAL
  Filled 2017-02-11 (×2): qty 1

## 2017-02-11 MED ORDER — DEXTROSE 5 % IV SOLN
1.0000 g | Freq: Once | INTRAVENOUS | Status: DC
  Filled 2017-02-11: qty 10

## 2017-02-11 MED ORDER — VANCOMYCIN HCL IN DEXTROSE 1-5 GM/200ML-% IV SOLN
1000.0000 mg | Freq: Once | INTRAVENOUS | Status: AC
  Administered 2017-02-11: 1000 mg via INTRAVENOUS
  Filled 2017-02-11: qty 200

## 2017-02-11 MED ORDER — POTASSIUM CHLORIDE 10 MEQ/100ML IV SOLN
10.0000 meq | Freq: Once | INTRAVENOUS | Status: DC
  Filled 2017-02-11: qty 100

## 2017-02-11 MED ORDER — INSULIN ASPART 100 UNIT/ML ~~LOC~~ SOLN: 0.0000 [IU] | Freq: Three times a day (TID) | SUBCUTANEOUS | Status: DC

## 2017-02-11 MED ORDER — SENNOSIDES-DOCUSATE SODIUM 8.6-50 MG PO TABS: 1.0000 | ORAL_TABLET | Freq: Every evening | ORAL | Status: DC | PRN

## 2017-02-11 MED ORDER — ROPINIROLE HCL 0.25 MG PO TABS
0.2500 mg | ORAL_TABLET | Freq: Three times a day (TID) | ORAL | Status: DC
  Administered 2017-02-11 – 2017-02-12 (×2): 0.25 mg via ORAL
  Filled 2017-02-11 (×4): qty 1

## 2017-02-11 MED ORDER — CITALOPRAM HYDROBROMIDE 20 MG PO TABS
20.0000 mg | ORAL_TABLET | Freq: Every day | ORAL | Status: DC
  Administered 2017-02-12: 20 mg via ORAL
  Filled 2017-02-11: qty 1

## 2017-02-11 MED ORDER — DOXYCYCLINE HYCLATE 100 MG PO TABS
100.0000 mg | ORAL_TABLET | Freq: Once | ORAL | Status: DC
  Filled 2017-02-11: qty 1

## 2017-02-11 MED ORDER — GUAIFENESIN ER 600 MG PO TB12
600.0000 mg | ORAL_TABLET | Freq: Two times a day (BID) | ORAL | Status: DC
  Administered 2017-02-11 – 2017-02-12 (×2): 600 mg via ORAL
  Filled 2017-02-11 (×2): qty 1

## 2017-02-11 MED ORDER — ONDANSETRON HCL 4 MG/2ML IJ SOLN: 4.0000 mg | Freq: Four times a day (QID) | INTRAMUSCULAR | Status: DC | PRN

## 2017-02-11 NOTE — ED Notes (Signed)
See paper charting for downtime medications given and VS.

## 2017-02-11 NOTE — ED Notes (Signed)
Called dietary to get meal tray sent to ED

## 2017-02-11 NOTE — Progress Notes (Signed)
ANTIBIOTIC CONSULT NOTE - INITIAL  Pharmacy Consult for Vancomycin  Indication: pneumonia  Allergies  Allergen Reactions  . Aspirin Itching  . Celebrex [Celecoxib] Itching    itching  . Ciprofloxacin Itching  . Codeine Itching  . Fosphenytoin Itching  . Levaquin [Levofloxacin In D5w] Itching  . Levofloxacin Itching  . Lovastatin Itching  . Penicillins     Has patient had a PCN reaction causing immediate rash, facial/tongue/throat swelling, SOB or lightheadedness with hypotension: Yes Has patient had a PCN reaction causing severe rash involving mucus membranes or skin necrosis: Yes Has patient had a PCN reaction that required hospitalization: NO Has patient had a PCN reaction occurring within the last 10 years: NO If all of the above answers are "NO", then may proceed with Cephalosporin use.   . Pravastatin Itching  . Sulfa Antibiotics Itching    Patient Measurements:   Adjusted Body Weight: 59.6 kg   Vital Signs: Temp: 97.5 F (36.4 C) (08/08 2012) Temp Source: Axillary (08/08 2012) BP: 124/49 (08/08 2012) Pulse Rate: 57 (08/08 2012) Intake/Output from previous day: No intake/output data recorded. Intake/Output from this shift: No intake/output data recorded.  Labs:  Recent Labs  02/11/17 1305  WBC 14.7*  HGB 15.9  PLT 337  CREATININE 0.71   Estimated Creatinine Clearance: 66 mL/min (by C-G formula based on SCr of 0.71 mg/dL). No results for input(s): VANCOTROUGH, VANCOPEAK, VANCORANDOM, GENTTROUGH, GENTPEAK, GENTRANDOM, TOBRATROUGH, TOBRAPEAK, TOBRARND, AMIKACINPEAK, AMIKACINTROU, AMIKACIN in the last 72 hours.   Microbiology: Recent Results (from the past 720 hour(s))  Urine Culture     Status: Abnormal   Collection Time: 02/06/17 11:09 AM  Result Value Ref Range Status   Specimen Description URINE, RANDOM  Final   Special Requests NONE  Final   Culture (A)  Final    <10,000 COLONIES/mL INSIGNIFICANT GROWTH Performed at [facility], [address], [place], [place] [address]    Report Status 02/08/2017 FINAL  Final    Medical History: Past Medical History:  Diagnosis Date  . Asthma   . Cirrhosis, non-alcoholic ([place])   . COPD (chronic obstructive pulmonary disease) ([place])   . Deaf   . Depression   . Diabetes mellitus without complication ([place])   . GERD (gastroesophageal reflux disease)   . Heart murmur   . Hepatitis   . Hypertension   . Lymph node disorder    arm  . Neuropathy   . On home oxygen therapy    hs  . Orthopnea   . RLS (restless legs syndrome)   . Seizures ([place])   . Shortness of breath dyspnea   . Sleep apnea   . Stroke ([place])    tia    Medications:  Scheduled:  . [START ON 02/12/2017] citalopram  20 mg Oral Daily  . [START ON 02/12/2017] clopidogrel  75 mg Oral Daily  . dextromethorphan  30 mg Oral BID  . doxycycline  100 mg Oral Once  . doxycycline  100 mg Oral Q12H  . enoxaparin (LOVENOX) injection  40 mg Subcutaneous Q24H  . gabapentin  300 mg Oral TID  . guaiFENesin  600 mg Oral BID  . insulin aspart  0-5 Units Subcutaneous QHS  . [START ON 02/12/2017] insulin aspart  0-9 Units Subcutaneous TID WC  . ipratropium-albuterol  3 mL Nebulization Once  . ipratropium-albuterol  3 mL Nebulization Once  . lacosamide  50 mg Oral BID  . methylPREDNISolone (SOLU-MEDROL) injection  125 mg Intravenous Once  . mometasone-formoterol  2 puff Inhalation BID  . [START ON 02/12/2017] montelukast  10 mg Oral QHS  . mupirocin ointment  1 application Nasal BID  . [START ON 02/12/2017] pantoprazole  40 mg Oral Daily  . potassium chloride  10 mEq Oral Daily  . potassium chloride  40 mEq Oral Once  . rOPINIRole  0.25 mg Oral TID   Assessment: CrCl = 74.3 ml/min Ke = 0.066 hr-1 T1/2 = 10.5 hrs Vd = 41.7 L   Goal of Therapy:  Vancomycin trough level 15-20 mcg/ml  Plan:  Expected duration 7 days with resolution of temperature and/or normalization of WBC   Vancomycin 1 gm IV X 1 ordered to be given on 8/8 @  22:00. Vancomycin 750 mg IV Q12H ordered to start on 8/9 @ 06:00, ~ 8 hrs after 1st dose (stacked dosing). This pt will reach Css by 8/10 @ 22:00. Will draw 1st trough on 8/10 @ 17:30, which will be very close to Css.   [name] 02/11/2017,9:53 PM

## 2017-02-11 NOTE — ED Notes (Signed)
Pt family member numbers:  [name] [phone] Glenard Haring [phone]  Pt pastor came to see pt.

## 2017-02-11 NOTE — ED Notes (Signed)
Date and time results received: 02/11/17    Test: potassium Critical Value: 2.5  Test: lactic acid  Value : 2.1  Name of Provider Notified: dr. Alfred Levins

## 2017-02-11 NOTE — ED Notes (Signed)
Pt arrives via ACMES from doctors office for CP. Last Friday pt was seen here for pneumonia, left AMA to go to a funeral instead of being admitted. Pt has finished prednisone but still taking antibiotics. Pt c/o L arm pain, central CP, appears SOB with movement. Pt is on 2 L nasal cannula chronically. EMS raised to 3 L for 87% on RA. Pt is allergic to ASA so none given enroute, pt was c/o of nausea so EMS gave 22m zofran en route. 20 g IV L AC. EMS reports rhonci. Pt has congested cough. Pt alert, oriented, ambulatory to toilet. Pt is deaf, in person interpreter requested. Pt does NOT want ipad interpreter.   CBG 168, 97.7 oral with EMS, 157/78, 96% on 3 L nasal cannula with EMS.

## 2017-02-11 NOTE — ED Notes (Signed)
Pt provided meal tray after getting up to use the restroom. Pt informed of delay getting to her inpatient room.

## 2017-02-11 NOTE — H&P (Signed)
[facility] NAME: [name]    MR#:  [id number]  DATE OF BIRTH:  11/17/1950  DATE OF ADMISSION:  02/11/2017  PRIMARY CARE PHYSICIAN: Cletis Athens, MD   REQUESTING/REFERRING PHYSICIAN: Rudene Re, MD  CHIEF COMPLAINT:   Chief Complaint  Patient presents with  . Chest Pain   Chest pain, cough and shortness breath for 5 days. HISTORY OF PRESENT ILLNESS:  [name]  is a 66 y.o. female with a known history of COPD, CVA, cirrhosis, hypertension and diabetes. The patient presented to the ED with the above chief complaints. She was seen in the ED 5 days ago. She was diagnosed with pneumonia and hypokalemia. She was recommended admission to the hospital but due to her sister's funeral, the patient left AMA. The patient has been taking doxycycline and prednisone without improvement. Her symptoms has been worsening for the past 5 days. She was found hypoxia as 89% on 2 L oxygen. Chest x-ray show left lower lobe pneumonia.  PAST MEDICAL HISTORY:   Past Medical History:  Diagnosis Date  . Asthma   . Cirrhosis, non-alcoholic ([place])   . COPD (chronic obstructive pulmonary disease) ([place])   . Deaf   . Depression   . Diabetes mellitus without complication ([place])   . GERD (gastroesophageal reflux disease)   . Heart murmur   . Hepatitis   . Hypertension   . Lymph node disorder    arm  . Neuropathy   . On home oxygen therapy    hs  . Orthopnea   . RLS (restless legs syndrome)   . Seizures ([place])   . Shortness of breath dyspnea   . Sleep apnea   . Stroke [facility])    tia    PAST SURGICAL HISTORY:   Past Surgical History:  Procedure Laterality Date  . CATARACT EXTRACTION W/PHACO Right 11/23/2014   Procedure: CATARACT EXTRACTION PHACO AND INTRAOCULAR LENS PLACEMENT (IOC);  Surgeon: Lyla Glassing, MD;  Location: [facility] ORS;  Service: Ophthalmology;  Laterality: Right;  . CATARACT EXTRACTION W/PHACO Left 12/14/2014   Procedure: CATARACT EXTRACTION PHACO AND INTRAOCULAR LENS PLACEMENT (IOC);  Surgeon: Lyla Glassing, MD;  Location: [facility] ORS;  Service: Ophthalmology;  Laterality: Left;  US:01:16.6 AP:15.8 CDE:12.14  . CESAREAN SECTION    . CHOLECYSTECTOMY    . KNEE ARTHROPLASTY    . THUMB ARTHROSCOPY    . TONSILLECTOMY    . TYMPANOPLASTY     muliple    SOCIAL HISTORY:   Social History  Substance Use Topics  . Smoking status: Current Every Day Smoker    Packs/day: 0.50  . Smokeless tobacco: Never Used  . Alcohol use No    FAMILY HISTORY:   Family History  Problem Relation Age of Onset  . Lung cancer Mother   . CAD Father     DRUG ALLERGIES:   Allergies  Allergen Reactions  . Aspirin Itching  . Celebrex [Celecoxib] Itching    itching  . Ciprofloxacin Itching  . Codeine Itching  . Fosphenytoin Itching  . Levaquin [Levofloxacin In D5w] Itching  . Levofloxacin Itching  . Lovastatin Itching  . Penicillins     Has patient had a PCN reaction causing immediate rash, facial/tongue/throat swelling, SOB or lightheadedness with hypotension: Yes Has patient had a PCN reaction causing severe rash involving mucus membranes or skin necrosis: Yes Has patient had a PCN reaction that required hospitalization: NO Has patient had a PCN reaction occurring within the last 10  years: NO If all of the above answers are "NO", then may proceed with Cephalosporin use.   . Pravastatin Itching  . Sulfa Antibiotics Itching    REVIEW OF SYSTEMS:   Review of Systems  Constitutional: Positive for chills and malaise/fatigue. Negative for fever.  HENT: Positive for hearing loss. Negative for sore throat.   Eyes: Negative for blurred vision and double vision.  Respiratory: Positive for cough and shortness of breath. Negative for hemoptysis, wheezing and stridor.   Cardiovascular: Positive for chest pain. Negative for palpitations, orthopnea and leg swelling.  Gastrointestinal: Positive for melena. Negative  for abdominal pain, blood in stool, diarrhea, nausea and vomiting.  Genitourinary: Negative for dysuria, flank pain and hematuria.  Musculoskeletal: Positive for back pain. Negative for joint pain.  Skin: Negative for rash.  Neurological: Positive for weakness. Negative for dizziness, sensory change, focal weakness, seizures, loss of consciousness and headaches.  Endo/Heme/Allergies: Negative for polydipsia.  Psychiatric/Behavioral: Negative for depression. The patient is not nervous/anxious.     MEDICATIONS AT HOME:   Prior to Admission medications   Medication Sig Start Date End Date Taking? Authorizing Provider  albuterol (PROVENTIL HFA;VENTOLIN HFA) 108 (90 BASE) MCG/ACT inhaler Inhale 2 puffs into the lungs every 4 (four) hours as needed for wheezing or shortness of breath.   Yes [provider]  albuterol (PROVENTIL) (2.5 MG/3ML) 0.083% nebulizer solution Take 3 mLs (2.5 mg total) by nebulization every 4 (four) hours as needed for wheezing or shortness of breath. 07/17/16  Yes Vaughan Basta, MD  citalopram (CELEXA) 20 MG tablet Take 1 tablet (20 mg total) by mouth daily. 07/17/16  Yes Vaughan Basta, MD  clopidogrel (PLAVIX) 75 MG tablet Take 1 tablet (75 mg total) by mouth daily. 07/17/16  Yes Vaughan Basta, MD  diltiazem (DILACOR XR) 180 MG 24 hr capsule Take 180 mg by mouth daily.   Yes [provider]  doxycycline (VIBRAMYCIN) 100 MG capsule Take 1 capsule (100 mg total) by mouth 2 (two) times daily. 02/06/17 02/16/17 Yes Ver[place], [place], MD  EPINEPHrine 0.3 mg/0.3 mL IJ SOAJ injection Inject into the muscle once.   Yes [provider]  furosemide (LASIX) 40 MG tablet Take 1 tablet (40 mg total) by mouth daily. 07/17/16  Yes Vaughan Basta, MD  gabapentin (NEURONTIN) 300 MG capsule Take 1 capsule by mouth 3 (three) times daily. 01/16/16  Yes [provider]  GLIPIZIDE XL 5 MG 24 hr tablet Take 1 tablet by mouth daily.  02/26/16  Yes [provider]  lacosamide (VIMPAT) 50 MG TABS tablet Take 1 tablet (50 mg total) by mouth 2 (two) times daily. 07/17/16  Yes Vaughan Basta, MD  losartan-hydrochlorothiazide (HYZAAR) 50-12.5 MG tablet Take 1 tablet by mouth daily.   Yes [provider]  mometasone-formoterol (DULERA) 200-5 MCG/ACT AERO Inhale 2 puffs into the lungs 2 (two) times daily. 07/17/16  Yes Vaughan Basta, MD  montelukast (SINGULAIR) 10 MG tablet Take 1 tablet by mouth at bedtime. 03/18/16  Yes [provider]  omeprazole (PRILOSEC) 20 MG capsule Take 20 mg by mouth daily.   Yes [provider]  rOPINIRole (REQUIP) 0.25 MG tablet Take 1 tablet (0.25 mg total) by mouth 3 (three) times daily. 03/24/16  Yes Oswald Hillock, MD  amLODipine (NORVASC) 10 MG tablet Take 1 tablet (10 mg total) by mouth daily. Patient not taking: Reported on 02/06/2017 07/17/16   Vaughan Basta, MD  dextromethorphan (DELSYM) 30 MG/5ML liquid Take 5 mLs (30 mg total) by mouth 2 (  two) times daily. Patient not taking: Reported on 02/06/2017 07/17/16   Vaughan Basta, MD  enalapril (VASOTEC) 20 MG tablet Take 1 tablet (20 mg total) by mouth daily. Patient not taking: Reported on 02/06/2017 07/17/16   Vaughan Basta, MD  guaiFENesin (MUCINEX) 600 MG 12 hr tablet Take 1 tablet (600 mg total) by mouth 2 (two) times daily. Patient not taking: Reported on 02/06/2017 07/17/16   Vaughan Basta, MD  ipratropium-albuterol (DUONEB) 0.5-2.5 (3) MG/3ML SOLN Take 3 mLs by nebulization every 6 (six) hours as needed. Patient not taking: Reported on 02/06/2017 07/17/16   Vaughan Basta, MD  mupirocin ointment (BACTROBAN) 2 % Place 1 application into the nose 2 (two) times daily. Patient not taking: Reported on 02/06/2017 07/17/16   Vaughan Basta, MD  potassium chloride (K-DUR) 10 MEQ tablet Take 1 tablet (10 mEq total) by mouth daily. Patient not taking: Reported on 02/06/2017  07/17/16   Vaughan Basta, MD      VITAL SIGNS:  Blood pressure 124/64, pulse 60, temperature 97.6 F (36.4 C), temperature source Oral, resp. rate (!) 33, SpO2 93 %.  PHYSICAL EXAMINATION:  Physical Exam  GENERAL:  66 y.o.-year-old patient lying in the bed with no acute distress.  EYES: Pupils equal, round, reactive to light and accommodation. No scleral icterus. Extraocular muscles intact.  HEENT: Head atraumatic, normocephalic. Oropharynx and nasopharynx clear.  NECK:  Supple, no jugular venous distention. No thyroid enlargement, no tenderness.  LUNGS: Normal breath sounds bilaterally, no wheezing, rales,rhonchi or crepitation. No use of accessory muscles of respiration.  CARDIOVASCULAR: S1, S2 normal. No murmurs, rubs, or gallops.  ABDOMEN: Soft, nontender, nondistended. Bowel sounds present. No organomegaly or mass.  EXTREMITIES: No pedal edema, cyanosis, or clubbing.  NEUROLOGIC: Cranial nerves II through XII are intact. Muscle strength 5/5 in all extremities. Sensation intact. Gait not checked.  PSYCHIATRIC: The patient is alert and oriented x 3.  SKIN: No obvious rash, lesion, or ulcer.   LABORATORY PANEL:   CBC  Recent Labs Lab 02/11/17 1305  WBC 14.7*  HGB 15.9  HCT 46.7  PLT 337   ------------------------------------------------------------------------------------------------------------------  Chemistries   Recent Labs Lab 02/06/17 1104 02/11/17 1305  NA 137 140  K 2.5* 2.5*  CL 91* 92*  CO2 32 33*  GLUCOSE 205* 141*  BUN 13 12  CREATININE 0.63 0.71  CALCIUM 9.2 9.2  AST 25  --   ALT 14  --   ALKPHOS 134*  --   BILITOT 1.3*  --    ------------------------------------------------------------------------------------------------------------------  Cardiac Enzymes  Recent Labs Lab 02/11/17 1305  TROPONINI <0.03    ------------------------------------------------------------------------------------------------------------------  RADIOLOGY:  Dg Chest 2 View  Result Date: 02/11/2017 CLINICAL DATA:  Chest and left arm pain.  Shortness of breath. EXAM: CHEST  2 VIEW COMPARISON:  PA and lateral chest 02/06/2017.  CT chest 07/14/2016. FINDINGS: The lungs are clear. Heart size is normal. No pneumothorax or pleural effusion. Aortic atherosclerosis noted. No acute bony abnormality. IMPRESSION: No acute disease. Atherosclerosis. Electronically Signed   By: Inge Rise M.D.   On: 02/11/2017 13:46      IMPRESSION AND PLAN:   Sepsis due to pneumonia. The patient will be admitted to medical floor. Continue antibiotics, follow-up CBC and blood culture. CBC when necessary.  Hypokalemia. Give both by mouth and IV potassium supplement, follow-up potassium level and magnesium level.  Lactic acidosis. Follow-up lactic acid level. Hypertension, continue hypertension medication. Diabetes. Start a sliding scale.  All information was OBTAINED via interpreter. All the records  are reviewed and case discussed with ED provider. Management plans discussed with the patient, family and they are in agreement.  CODE STATUS: Full code  TOTAL TIME TAKING CARE OF THIS PATIENT: 58 minutes.    Demetrios Loll M.D on 02/11/2017 at 6:32 PM  Between 7am to 6pm - Pager - [phone]  After 6pm go to [email or web address] - Proofreader  [facility]  Office  [phone]  CC: Primary care physician; Cletis Athens, MD   Note: This dictation was prepared with Dragon dictation along with smaller phrase technology. Any transcriptional errors that result from this process are unintentional.

## 2017-02-11 NOTE — ED Notes (Signed)
Pt taken to xray via stretcher  

## 2017-02-11 NOTE — ED Provider Notes (Signed)
[facility] Emergency Department Provider Note  ____________________________________________  Time seen: Approximately 5:09 PM  I have reviewed the triage vital signs and the nursing notes.   HISTORY  Chief Complaint Chest Pain   HPI [name] is a 66 y.o. female with h/o COPD, asthma, cirrhosis, DM, HTN who presents for evaluation of chest pain or shortness of breath. Patient was seen here 5 days ago and diagnosed her with pneumonia and hypokalemia. She was recommended admission to the hospital however due to her sister's funeral patient left [place]. Due to patient's several antibiotic allergies she was discharged on doxycycline only and prednisone. She endorses compliance with her medications. She went to follow up with her primary care doctor today and was still complaining of significant shortness of breath and chest pain that she describes as tighthness, located in the center of her chest, intermittent,associated with shortness of breath. No fever or chills. No nausea or vomiting. Patient was found to be hypoxic 89% on 2 L nasal cannula which is her baseline.  Past Medical History:  Diagnosis Date  . Asthma   . Cirrhosis, non-alcoholic ([place])   . COPD (chronic obstructive pulmonary disease) ([place])   . Deaf   . Depression   . Diabetes mellitus without complication ([place])   . GERD (gastroesophageal reflux disease)   . Heart murmur   . Hepatitis   . Hypertension   . Lymph node disorder    arm  . Neuropathy   . On home oxygen therapy    hs  . Orthopnea   . RLS (restless legs syndrome)   . Seizures ([place])   . Shortness of breath dyspnea   . Sleep apnea   . Stroke [facility])    tia    Patient Active Problem List   Diagnosis Date Noted  . GERD (gastroesophageal reflux disease) 03/23/2016  . Depression 03/23/2016  . Convulsions/seizures ([place]) 03/22/2016  . DM (diabetes mellitus), type 2 ([place]) 03/22/2016  . Essential hypertension  03/22/2016  . Neuropathy 03/22/2016  . Convulsions ([place]) 03/22/2016  . COPD exacerbation ([place]) 03/31/2015  . Acute respiratory failure with hypoxia ([place]) 03/29/2015    Past Surgical History:  Procedure Laterality Date  . CATARACT EXTRACTION W/PHACO Right 11/23/2014   Procedure: CATARACT EXTRACTION PHACO AND INTRAOCULAR LENS PLACEMENT (IOC);  Surgeon: Lyla Glassing, MD;  Location: [facility] ORS;  Service: Ophthalmology;  Laterality: Right;  . CATARACT EXTRACTION W/PHACO Left 12/14/2014   Procedure: CATARACT EXTRACTION PHACO AND INTRAOCULAR LENS PLACEMENT (IOC);  Surgeon: Lyla Glassing, MD;  Location: [facility] ORS;  Service: Ophthalmology;  Laterality: Left;  US:01:16.6 AP:15.8 CDE:12.14  . CESAREAN SECTION    . CHOLECYSTECTOMY    . KNEE ARTHROPLASTY    . THUMB ARTHROSCOPY    . TONSILLECTOMY    . TYMPANOPLASTY     muliple    Prior to Admission medications   Medication Sig Start Date End Date Taking? Authorizing Provider  albuterol (PROVENTIL HFA;VENTOLIN HFA) 108 (90 BASE) MCG/ACT inhaler Inhale 2 puffs into the lungs every 4 (four) hours as needed for wheezing or shortness of breath.   Yes [provider]  albuterol (PROVENTIL) (2.5 MG/3ML) 0.083% nebulizer solution Take 3 mLs (2.5 mg total) by nebulization every 4 (four) hours as needed for wheezing or shortness of breath. 07/17/16  Yes Vaughan Basta, MD  citalopram (CELEXA) 20 MG tablet Take 1 tablet (20 mg total) by mouth daily. 07/17/16  Yes Vaughan Basta, MD  clopidogrel (PLAVIX) 75 MG tablet Take  1 tablet (75 mg total) by mouth daily. 07/17/16  Yes Vaughan Basta, MD  diltiazem (DILACOR XR) 180 MG 24 hr capsule Take 180 mg by mouth daily.   Yes [provider]  doxycycline (VIBRAMYCIN) 100 MG capsule Take 1 capsule (100 mg total) by mouth 2 (two) times daily. 02/06/17 02/16/17 Yes Ceola Para, [place], MD  EPINEPHrine 0.3 mg/0.3 mL IJ SOAJ injection Inject into the muscle once.   Yes [provider]  furosemide (LASIX) 40 MG tablet Take 1 tablet (40 mg total) by mouth daily. 07/17/16  Yes Vaughan Basta, MD  gabapentin (NEURONTIN) 300 MG capsule Take 1 capsule by mouth 3 (three) times daily. 01/16/16  Yes [provider]  GLIPIZIDE XL 5 MG 24 hr tablet Take 1 tablet by mouth daily. 02/26/16  Yes [provider]  lacosamide (VIMPAT) 50 MG TABS tablet Take 1 tablet (50 mg total) by mouth 2 (two) times daily. 07/17/16  Yes Vaughan Basta, MD  losartan-hydrochlorothiazide (HYZAAR) 50-12.5 MG tablet Take 1 tablet by mouth daily.   Yes [provider]  mometasone-formoterol (DULERA) 200-5 MCG/ACT AERO Inhale 2 puffs into the lungs 2 (two) times daily. 07/17/16  Yes Vaughan Basta, MD  montelukast (SINGULAIR) 10 MG tablet Take 1 tablet by mouth at bedtime. 03/18/16  Yes [provider]  omeprazole (PRILOSEC) 20 MG capsule Take 20 mg by mouth daily.   Yes [provider]  rOPINIRole (REQUIP) 0.25 MG tablet Take 1 tablet (0.25 mg total) by mouth 3 (three) times daily. 03/24/16  Yes Oswald Hillock, MD  amLODipine (NORVASC) 10 MG tablet Take 1 tablet (10 mg total) by mouth daily. Patient not taking: Reported on 02/06/2017 07/17/16   Vaughan Basta, MD  dextromethorphan (DELSYM) 30 MG/5ML liquid Take 5 mLs (30 mg total) by mouth 2 (two) times daily. Patient not taking: Reported on 02/06/2017 07/17/16   Vaughan Basta, MD  enalapril (VASOTEC) 20 MG tablet Take 1 tablet (20 mg total) by mouth daily. Patient not taking: Reported on 02/06/2017 07/17/16   Vaughan Basta, MD  guaiFENesin (MUCINEX) 600 MG 12 hr tablet Take 1 tablet (600 mg total) by mouth 2 (two) times daily. Patient not taking: Reported on 02/06/2017 07/17/16   Vaughan Basta, MD  ipratropium-albuterol (DUONEB) 0.5-2.5 (3) MG/3ML SOLN Take 3 mLs by nebulization every 6 (six) hours as needed. Patient not taking: Reported on 02/06/2017 07/17/16    Vaughan Basta, MD  mupirocin ointment (BACTROBAN) 2 % Place 1 application into the nose 2 (two) times daily. Patient not taking: Reported on 02/06/2017 07/17/16   Vaughan Basta, MD  potassium chloride (K-DUR) 10 MEQ tablet Take 1 tablet (10 mEq total) by mouth daily. Patient not taking: Reported on 02/06/2017 07/17/16   Vaughan Basta, MD    Allergies Aspirin; Celebrex [celecoxib]; Ciprofloxacin; Codeine; Fosphenytoin; Levaquin [levofloxacin in d5w]; Levofloxacin; Lovastatin; Penicillins; Pravastatin; and Sulfa antibiotics  Family History  Problem Relation Age of Onset  . Lung cancer Mother   . CAD Father     Social History Social History  Substance Use Topics  . Smoking status: Current Every Day Smoker    Packs/day: 0.50  . Smokeless tobacco: Never Used  . Alcohol use No    Review of Systems  Constitutional: Negative for fever. Eyes: Negative for visual changes. ENT: Negative for sore throat. Neck: No neck pain  Cardiovascular: + chest pain. Respiratory: + shortness of breath. Gastrointestinal: Negative for abdominal pain, vomiting or diarrhea. Genitourinary: Negative for dysuria. Musculoskeletal: Negative for back pain. Skin:  Negative for rash. Neurological: Negative for headaches, weakness or numbness. Psych: No SI or HI  ____________________________________________   PHYSICAL EXAM:  VITAL SIGNS: ED Triage Vitals  Enc Vitals Group     BP 02/11/17 1306 (!) 144/87     Pulse Rate 02/11/17 1306 65     Resp 02/11/17 1306 20     Temp 02/11/17 1306 97.6 F (36.4 C)     Temp Source 02/11/17 1306 Oral     SpO2 02/11/17 1306 92 %     Weight --      Height --      Head Circumference --      Peak Flow --      Pain Score 02/11/17 1303 3     Pain Loc --      Pain Edu? --      Excl. in [place]? --     Constitutional: Alert and oriented. Well appearing and in no apparent distress. HEENT:      Head: Normocephalic and atraumatic.         Eyes:  Conjunctivae are normal. Sclera is non-icteric.       Mouth/Throat: Mucous membranes are moist.       Neck: Supple with no signs of meningismus. Cardiovascular: Regular rate and rhythm. No murmurs, gallops, or rubs. 2+ symmetrical distal pulses are present in all extremities. No JVD. Respiratory: Normal respiratory effort. Diffuse expiratory wheezes with crackles on the bases bilaterally Gastrointestinal: Soft, non tender, and non distended with positive bowel sounds. No rebound or guarding. Musculoskeletal: Nontender with normal range of motion in all extremities. No edema, cyanosis, or erythema of extremities. Neurologic: Normal speech and language. Face is symmetric. Moving all extremities. No gross focal neurologic deficits are appreciated. Skin: Skin is warm, dry and intact. No rash noted. Psychiatric: Mood and affect are normal. Speech and behavior are normal.  ____________________________________________   LABS (all labs ordered are listed, but only abnormal results are displayed)  Labs Reviewed  BASIC METABOLIC PANEL - Abnormal; Notable for the following:       Result Value   Potassium 2.5 (*)    Chloride 92 (*)    CO2 33 (*)    Glucose, Bld 141 (*)    All other components within normal limits  CBC - Abnormal; Notable for the following:    WBC 14.7 (*)    RBC 5.73 (*)    All other components within normal limits  LACTIC ACID, PLASMA - Abnormal; Notable for the following:    Lactic Acid, Venous 2.1 (*)    All other components within normal limits  TROPONIN I   ____________________________________________  EKG  ED ECG REPORT I, Rudene Re, the attending physician, personally viewed and interpreted this ECG.  Normal sinus rhythm, rate of 63, normal intervals, normal axis, occasional PVCs, no ST elevations or depressions. ____________________________________________  RADIOLOGY  CXR: Worsening infiltrate on the L base    ____________________________________________   PROCEDURES  Procedure(s) performed: None Procedures Critical Care performed: yes  CRITICAL CARE Performed by: Rudene Re  ?  Total critical care time: 35 min  Critical care time was exclusive of separately billable procedures and treating other patients.  Critical care was necessary to treat or prevent imminent or life-threatening deterioration.  Critical care was time spent personally by me on the following activities: development of treatment plan with patient and/or surrogate as well as nursing, discussions with consultants, evaluation of patient's response to treatment, examination of patient, obtaining history from patient or surrogate,  ordering and performing treatments and interventions, ordering and review of laboratory studies, ordering and review of radiographic studies, pulse oximetry and re-evaluation of patient's condition.  ____________________________________________   INITIAL IMPRESSION / ASSESSMENT AND PLAN / ED COURSE  66 y.o. female with h/o COPD, asthma, cirrhosis, DM, HTN who presents for evaluation of chest pain and shortness of breath.chest x-ray with worsening infiltrate on the left base, vitals within normal limits, patient has diffuse wheezing and crackles.labs showing again hypokalemia with K of 2.5 which was supplemented by mouth and IV. Lactic acid of 2.1 concerning for sepsis. Patient was given Rocephin and doxycycline. Patient also given IV fluids. Patient was given DuoNeb and Solu-Medrol for COPD exacerbation.Patient admitted to the hospitalist service     Pertinent labs & imaging results that were available during my care of the patient were reviewed by me and considered in my medical decision making (see chart for details).    ____________________________________________   FINAL CLINICAL IMPRESSION(S) / ED DIAGNOSES  Final diagnoses:  Community acquired pneumonia, unspecified laterality   Sepsis, due to unspecified organism ([place])  Hypokalemia  COPD exacerbation ([place])      NEW MEDICATIONS STARTED DURING THIS VISIT:  New Prescriptions   No medications on file     Note:  This document was prepared using Dragon voice recognition software and may include unintentional dictation errors.    Alfred Levins, [place], MD 02/11/17 [phone]

## 2017-02-11 NOTE — Progress Notes (Signed)
Lab called to report critical value, Glucose 598. MD paged

## 2017-02-11 NOTE — Telephone Encounter (Signed)
Lmov for patient to call back and schedule ED fu from 02/06/17 seen for SOB   Will try again at a later time

## 2017-02-11 NOTE — ED Notes (Signed)
Pt assisted to toilet with oxygen on. Pt potassium finished at this time.

## 2017-02-12 DIAGNOSIS — A419 Sepsis, unspecified organism: Secondary | ICD-10-CM | POA: Diagnosis not present

## 2017-02-12 DIAGNOSIS — J129 Viral pneumonia, unspecified: Secondary | ICD-10-CM | POA: Diagnosis not present

## 2017-02-12 DIAGNOSIS — J441 Chronic obstructive pulmonary disease with (acute) exacerbation: Secondary | ICD-10-CM | POA: Diagnosis not present

## 2017-02-12 DIAGNOSIS — E876 Hypokalemia: Secondary | ICD-10-CM | POA: Diagnosis present

## 2017-02-12 DIAGNOSIS — I10 Essential (primary) hypertension: Secondary | ICD-10-CM | POA: Diagnosis not present

## 2017-02-12 DIAGNOSIS — E119 Type 2 diabetes mellitus without complications: Secondary | ICD-10-CM | POA: Diagnosis not present

## 2017-02-12 DIAGNOSIS — E872 Acidosis: Secondary | ICD-10-CM | POA: Diagnosis not present

## 2017-02-12 DIAGNOSIS — J440 Chronic obstructive pulmonary disease with acute lower respiratory infection: Secondary | ICD-10-CM | POA: Diagnosis not present

## 2017-02-12 DIAGNOSIS — J189 Pneumonia, unspecified organism: Secondary | ICD-10-CM | POA: Diagnosis not present

## 2017-02-12 DIAGNOSIS — R0902 Hypoxemia: Secondary | ICD-10-CM | POA: Diagnosis not present

## 2017-02-12 LAB — CBC
HCT: 42.6 % (ref 35.0–47.0)
HEMOGLOBIN: 14.4 g/dL (ref 12.0–16.0)
MCH: 28 pg (ref 26.0–34.0)
MCHC: 33.9 g/dL (ref 32.0–36.0)
MCV: 82.7 fL (ref 80.0–100.0)
PLATELETS: 301 10*3/uL (ref 150–440)
RBC: 5.16 MIL/uL (ref 3.80–5.20)
RDW: 14.2 % (ref 11.5–14.5)
WBC: 10.7 10*3/uL (ref 3.6–11.0)

## 2017-02-12 LAB — BASIC METABOLIC PANEL
ANION GAP: 12 (ref 5–15)
BUN: 16 mg/dL (ref 6–20)
CALCIUM: 8.7 mg/dL — AB (ref 8.9–10.3)
CO2: 30 mmol/L (ref 22–32)
Chloride: 97 mmol/L — ABNORMAL LOW (ref 101–111)
Creatinine, Ser: 0.76 mg/dL (ref 0.44–1.00)
GFR calc Af Amer: >60 mL/min (ref >60)
GLUCOSE: 262 mg/dL — AB (ref 65–99)
Potassium: 3.7 mmol/L (ref 3.5–5.1)
Sodium: 139 mmol/L (ref 135–145)

## 2017-02-12 LAB — GLUCOSE, CAPILLARY
GLUCOSE-CAPILLARY: 204 mg/dL — AB (ref 65–99)
GLUCOSE-CAPILLARY: 233 mg/dL — AB (ref 65–99)
GLUCOSE-CAPILLARY: 320 mg/dL — AB (ref 65–99)
GLUCOSE-CAPILLARY: 531 mg/dL — AB (ref 65–99)
Glucose-Capillary: 254 mg/dL — ABNORMAL HIGH (ref 65–99)

## 2017-02-12 LAB — LACTIC ACID, PLASMA
LACTIC ACID, VENOUS: 4.1 mmol/L — AB (ref 0.5–1.9)
Lactic Acid, Venous: 3.2 mmol/L (ref 0.5–1.9)

## 2017-02-12 MED ORDER — DOXYCYCLINE HYCLATE 100 MG PO CAPS: 100.0000 mg | ORAL_CAPSULE | Freq: Two times a day (BID) | ORAL | 0 refills | Status: AC

## 2017-02-12 MED ORDER — INSULIN GLARGINE 100 UNIT/ML ~~LOC~~ SOLN
12.0000 [IU] | Freq: Every day | SUBCUTANEOUS | Status: DC
  Administered 2017-02-12: 12 [IU] via SUBCUTANEOUS
  Filled 2017-02-12 (×2): qty 0.12

## 2017-02-12 MED ORDER — INSULIN REGULAR HUMAN 100 UNIT/ML IJ SOLN
10.0000 [IU] | Freq: Once | INTRAMUSCULAR | Status: AC
  Administered 2017-02-12: 10 [IU] via INTRAVENOUS
  Filled 2017-02-12: qty 0.1

## 2017-02-12 MED ORDER — GLIPIZIDE XL 5 MG PO TB24: 10.0000 mg | ORAL_TABLET | Freq: Every day | ORAL | 0 refills | Status: DC

## 2017-02-12 MED ORDER — SODIUM CHLORIDE 0.9 % IV BOLUS (SEPSIS)
1000.0000 mL | Freq: Once | INTRAVENOUS | Status: AC
  Administered 2017-02-12: 1000 mL via INTRAVENOUS

## 2017-02-12 NOTE — Progress Notes (Signed)
Patient alert and oriented, vss, no complaints of pain.  D/c with some antibiotics.  No questions.  Interpreter present during discharge.    Patient escorted out of hospital via wheelchair by volunteers.

## 2017-02-12 NOTE — Progress Notes (Signed)
Dr Jannifer Franklin returned page. Received new insulin order

## 2017-02-12 NOTE — Progress Notes (Signed)
Patient ambulated around nursing station without oxygen and maintained an 02 saturation greater than or equal to 91% the entire time.

## 2017-02-12 NOTE — Progress Notes (Signed)
Notified Dr Marcille Blanco of CBG of 320. New order received

## 2017-02-12 NOTE — Progress Notes (Signed)
CBG 320 after receiving IV insulin 10 units. MD pagedd

## 2017-02-12 NOTE — Discharge Instructions (Signed)
Hypoxia Hypoxia is a condition that happens when there is a lack of oxygen in the body's tissues and organs. When there is not enough oxygen, organs cannot work as they should. This causes serious problems throughout the body and in the brain. What are the causes? This condition may be caused by:  Exposure to high altitude.  A collapsed lung (pneumothorax).  Lung infection (pneumonia).  Lung injury.  Long-term (chronic) lung disease, such as COPD (chronic obstructive pulmonary disease).  Blood collecting in the chest cavity (hemothorax).  Food, saliva, or vomit getting into the airway (aspiration).  Reduced blood flow (ischemia).  Severe blood loss.  Slow or shallow breathing (hypoventilation).  Blood disorders, such as anemia.  Carbon monoxide poisoning.  The heart suddenly stopping (cardiac arrest).  Anesthetic medicines.  Drowning.  Choking.  What are the signs or symptoms? Symptoms of this condition include:  Headache.  Fatigue.  Drowsiness.  Forgetfulness.  Nausea.  Confusion.  Shortness of breath.  Dizziness.  Bluish color of the skin, lips, or nail beds (cyanosis).  Change in consciousness or awareness.  If hypoxia is not treated, it can lead to convulsions, loss of consciousness (coma), or brain damage. How is this diagnosed? This condition may be diagnosed based on:  A physical exam.  Blood tests.  A test that measures how much oxygen is in your blood (pulse oximetry). This is done with a sensor that is placed on your finger, toe, or earlobe.  Chest X-ray.  Tests to check your lung function (pulmonary function tests).  A test to check the electrical activity of your heart (electrocardiogram, ECG).  You may have other tests to determine the cause of your hypoxia. How is this treated? Treatment for this condition depends on what is causing the hypoxia. You will likely be treated with oxygen therapy. This may be done by giving you  oxygen through a face mask or through tubes in your nose. Your health care provider may also recommend other therapies to treat the underlying cause of your hypoxia. Follow these instructions at home:  Take over-the-counter and prescription medicines only as told by your health care provider.  Do not use any products that contain nicotine or tobacco, such as cigarettes and e-cigarettes. If you need help quitting, ask your health care provider.  Avoid secondhand smoke.  Work with your health care provider to manage any chronic conditions you have that may be causing hypoxia, such as COPD.  Keep all follow-up visits as told by your health care provider. This is important. Contact a health care provider if:  You have a fever.  You have trouble breathing, even after treatment.  You become extremely short of breath when you exercise. Get help right away if:  Your shortness of breath gets worse, especially with normal or very little activity.  Your skin, lips, or nail beds have a bluish color.  You become confused or you cannot think properly.  You have chest pain. Summary  Hypoxia is a condition that happens when there is a lack of oxygen in the body's tissues and organs.  If hypoxia is not treated, it can lead to convulsions, loss of consciousness (coma), or brain damage.  Symptoms of hypoxia can include a headache, shortness of breath, confusion, nausea, and a bluish skin color.  Hypoxia has many possible causes, including exposure to high altitude, carbon monoxide poisoning, or other health issues, such as blood disorders or cardiac arrest.  Hypoxia is usually treated with oxygen therapy. This information  is not intended to replace advice given to you by your health care provider. Make sure you discuss any questions you have with your health care provider. Document Released: 08/11/2016 Document Revised: 08/11/2016 Document Reviewed: 08/11/2016 Elsevier Interactive Patient  Education  2018 [employer].

## 2017-02-12 NOTE — Care Management CC44 (Signed)
Condition Code 44 Documentation Completed  Patient Details  Name: [name] MRN: [id number] Date of Birth: January 04, 1951   Condition Code 44 given:  Yes Patient signature on Condition Code 44 notice:  Yes Documentation of 2 MD's agreement:  Yes Code 44 added to claim:  Yes    Jolly Mango, RN 02/12/2017, 9:16 AM

## 2017-02-12 NOTE — Care Management Obs Status (Signed)
[place] [facility] [place] NOTIFICATION   Patient Details  Name: [name] MRN: [id number] Date of Birth: 1951/06/19   Medicare Observation Status Notification Given:  Yes    Jolly Mango, RN 02/12/2017, 9:16 AM

## 2017-02-12 NOTE — Progress Notes (Addendum)
Inpatient Diabetes Program Recommendations  AACE/ADA: New Consensus Statement on Inpatient Glycemic Control (2015)  Target Ranges:  Prepandial:   less than 140 mg/dL      Peak postprandial:   less than 180 mg/dL (1-2 hours)      Critically ill patients:  140 - 180 mg/dL   Lab Results  Component Value Date   GLUCAP 254 (H) 02/12/2017   HGBA1C 7.9 (H) 07/12/2016    Review of Glycemic Control  Results for [name], [name] (MRN [id number]) as of 02/12/2017 07:53  Ref. Range 02/11/2017 23:32 02/11/2017 23:33 02/12/2017 02:32 02/12/2017 05:01 02/12/2017 07:34  Glucose-Capillary Latest Ref Range: 65 - 99 mg/dL 531 (HH) 527 (HH) 320 (H) 233 (H) 254 (H)    Diabetes history: Type 2 Outpatient Diabetes medications:Glipizide 33m qday Current orders for Inpatient glycemic control: Novolog 0-9 units tid/hs, Lantus 12 units qday  Inpatient Diabetes Program Recommendations: Given Lantus 12 units at  0534am today and Lantus 12 units qday ordered to begin tonight-  Agree with current orders for blood sugar management- will continue to follow  JGentry Fitz RN, [place] CDE Diabetes Coordinator Inpatient Diabetes Program  [phone](Team Pager) [phone]([place] 02/12/2017 7:58 AM  Addendum- Spoke with RN Amy regarding my concern for the patient refusing her insulin this am.  Amy is currently discharging the patient and has been discussing this with her.   JGentry Fitz RN, BA, MHA, CDE Diabetes Coordinator Inpatient Diabetes Program  [phone](Team Pager) [phone]([place] 02/12/2017 12:53 PM

## 2017-02-12 NOTE — Progress Notes (Signed)
Lactic acid 4.1.  Dr. Manuella Ghazi paged.

## 2017-02-13 LAB — HIV ANTIBODY (ROUTINE TESTING W REFLEX): HIV Screen 4th Generation wRfx: NONREACTIVE

## 2017-02-13 NOTE — Telephone Encounter (Signed)
Lmov for patient to call back and schedule ED fu from 02/06/17 seen for SOB   Will try again at a later time

## 2017-02-16 LAB — CULTURE, BLOOD (ROUTINE X 2)
CULTURE: NO GROWTH
Culture: NO GROWTH
Special Requests: ADEQUATE

## 2017-02-16 NOTE — Discharge Summary (Signed)
[name] at [facility] NAME: [name]    MR#:  [id number]  DATE OF BIRTH:  Aug 10, 1950  DATE OF ADMISSION:  02/11/2017   ADMITTING PHYSICIAN: Demetrios Loll, MD  DATE OF DISCHARGE: 02/12/2017  1:30 PM  PRIMARY CARE PHYSICIAN: Cletis Athens, MD   ADMISSION DIAGNOSIS:  Hypokalemia [E87.6] COPD exacerbation ([place]) [J44.1] Sepsis, due to unspecified organism ([place]) [A41.9] Community acquired pneumonia, unspecified laterality [J18.9] DISCHARGE DIAGNOSIS:  Active Problems:   Sepsis ([place])   Hypokalemia  SECONDARY DIAGNOSIS:   Past Medical History:  Diagnosis Date  . Asthma   . Cirrhosis, non-alcoholic ([place])   . COPD (chronic obstructive pulmonary disease) ([place])   . Deaf   . Depression   . Diabetes mellitus without complication ([place])   . GERD (gastroesophageal reflux disease)   . Heart murmur   . Hepatitis   . Hypertension   . Lymph node disorder    arm  . Neuropathy   . On home oxygen therapy    hs  . Orthopnea   . RLS (restless legs syndrome)   . Seizures ([place])   . Shortness of breath dyspnea   . Sleep apnea   . Stroke [facility])    tia   HOSPITAL COURSE:  66 y.o. female with a known history of COPD, CVA, cirrhosis, hypertension and diabetes. The patient presented to the ED with the above chief complaints. She was seen in the ED 5 days ago. She was diagnosed with pneumonia and hypokalemia. She was recommended admission to the hospital but due to her sister's funeral, the patient left AMA. The patient has been taking doxycycline and prednisone without much improvement. Her symptoms has been worsening for the past 5 days. She was found hypoxia as 89% on 2 L oxygen. Chest x-ray show left lower lobe pneumonia.  * Sepsis: present on admission due to pneumonia. - please note she had normal procalcitonin levels, so this is likely viral and not bacterial infection although considering elevated lactate and her symptoms, will treat her  empirically with Abx.  * Hypokalemia: repleted and resolved  DISCHARGE CONDITIONS:  stable CONSULTS OBTAINED:  Treatment Team:  Leonel Ramsay, MD DRUG ALLERGIES:   Allergies  Allergen Reactions  . Aspirin Itching  . Celebrex [Celecoxib] Itching    itching  . Ciprofloxacin Itching  . Codeine Itching  . Fosphenytoin Itching  . Levaquin [Levofloxacin In D5w] Itching  . Levofloxacin Itching  . Lovastatin Itching  . Penicillins     Has patient had a PCN reaction causing immediate rash, facial/tongue/throat swelling, SOB or lightheadedness with hypotension: Yes Has patient had a PCN reaction causing severe rash involving mucus membranes or skin necrosis: Yes Has patient had a PCN reaction that required hospitalization: NO Has patient had a PCN reaction occurring within the last 10 years: NO If all of the above answers are "NO", then may proceed with Cephalosporin use.   . Pravastatin Itching  . Sulfa Antibiotics Itching   DISCHARGE MEDICATIONS:   Allergies as of 02/12/2017      Reactions   Aspirin Itching   Celebrex [celecoxib] Itching   itching   Ciprofloxacin Itching   Codeine Itching   Fosphenytoin Itching   Levaquin [levofloxacin In D5w] Itching   Levofloxacin Itching   Lovastatin Itching   Penicillins    Has patient had a PCN reaction causing immediate rash, facial/tongue/throat swelling, SOB or lightheadedness with hypotension: Yes Has patient had a PCN reaction causing severe rash  involving mucus membranes or skin necrosis: Yes Has patient had a PCN reaction that required hospitalization: NO Has patient had a PCN reaction occurring within the last 10 years: NO If all of the above answers are "NO", then may proceed with Cephalosporin use.   Pravastatin Itching   Sulfa Antibiotics Itching      Medication List    TAKE these medications   albuterol 108 (90 Base) MCG/ACT inhaler Commonly known as:  PROVENTIL HFA;VENTOLIN HFA Inhale 2 puffs into the lungs  every 4 (four) hours as needed for wheezing or shortness of breath.   albuterol (2.5 MG/3ML) 0.083% nebulizer solution Commonly known as:  PROVENTIL Take 3 mLs (2.5 mg total) by nebulization every 4 (four) hours as needed for wheezing or shortness of breath.   amLODipine 10 MG tablet Commonly known as:  NORVASC Take 1 tablet (10 mg total) by mouth daily.   citalopram 20 MG tablet Commonly known as:  CELEXA Take 1 tablet (20 mg total) by mouth daily.   clopidogrel 75 MG tablet Commonly known as:  PLAVIX Take 1 tablet (75 mg total) by mouth daily.   dextromethorphan 30 MG/5ML liquid Commonly known as:  DELSYM Take 5 mLs (30 mg total) by mouth 2 (two) times daily.   diltiazem 180 MG 24 hr capsule Commonly known as:  DILACOR XR Take 180 mg by mouth daily.   doxycycline 100 MG capsule Commonly known as:  VIBRAMYCIN Take 1 capsule (100 mg total) by mouth 2 (two) times daily.   enalapril 20 MG tablet Commonly known as:  VASOTEC Take 1 tablet (20 mg total) by mouth daily.   EPINEPHrine 0.3 mg/0.3 mL Soaj injection Commonly known as:  EPI-PEN Inject into the muscle once.   furosemide 40 MG tablet Commonly known as:  LASIX Take 1 tablet (40 mg total) by mouth daily.   gabapentin 300 MG capsule Commonly known as:  NEURONTIN Take 1 capsule by mouth 3 (three) times daily.   GLIPIZIDE XL 5 MG 24 hr tablet Generic drug:  glipiZIDE Take 2 tablets (10 mg total) by mouth daily. What changed:  how much to take   guaiFENesin 600 MG 12 hr tablet Commonly known as:  MUCINEX Take 1 tablet (600 mg total) by mouth 2 (two) times daily.   ipratropium-albuterol 0.5-2.5 (3) MG/3ML Soln Commonly known as:  DUONEB Take 3 mLs by nebulization every 6 (six) hours as needed.   lacosamide 50 MG Tabs tablet Commonly known as:  VIMPAT Take 1 tablet (50 mg total) by mouth 2 (two) times daily.   losartan-hydrochlorothiazide 50-12.5 MG tablet Commonly known as:  HYZAAR Take 1 tablet by mouth  daily.   mometasone-formoterol 200-5 MCG/ACT Aero Commonly known as:  DULERA Inhale 2 puffs into the lungs 2 (two) times daily.   montelukast 10 MG tablet Commonly known as:  SINGULAIR Take 1 tablet by mouth at bedtime.   mupirocin ointment 2 % Commonly known as:  BACTROBAN Place 1 application into the nose 2 (two) times daily.   omeprazole 20 MG capsule Commonly known as:  PRILOSEC Take 20 mg by mouth daily.   potassium chloride 10 MEQ tablet Commonly known as:  K-DUR Take 1 tablet (10 mEq total) by mouth daily.   rOPINIRole 0.25 MG tablet Commonly known as:  REQUIP Take 1 tablet (0.25 mg total) by mouth 3 (three) times daily.        DISCHARGE INSTRUCTIONS:   DIET:  Cardiac diet DISCHARGE CONDITION:  Good ACTIVITY:  Activity as  tolerated OXYGEN:  Home Oxygen: No.  Oxygen Delivery: room air DISCHARGE LOCATION:  home   If you experience worsening of your admission symptoms, develop shortness of breath, life threatening emergency, suicidal or homicidal thoughts you must seek medical attention immediately by calling 911 or calling your MD immediately  if symptoms less severe.  You Must read complete instructions/literature along with all the possible adverse reactions/side effects for all the Medicines you take and that have been prescribed to you. Take any new Medicines after you have completely understood and accpet all the possible adverse reactions/side effects.   Please note  You were cared for by a hospitalist during your hospital stay. If you have any questions about your discharge medications or the care you received while you were in the hospital after you are discharged, you can call the unit and asked to speak with the hospitalist on call if the hospitalist that took care of you is not available. Once you are discharged, your primary care physician will handle any further medical issues. Please note that NO REFILLS for any discharge medications will be  authorized once you are discharged, as it is imperative that you return to your primary care physician (or establish a relationship with a primary care physician if you do not have one) for your aftercare needs so that they can reassess your need for medications and monitor your lab values.    On the day of Discharge:  VITAL SIGNS:  Blood pressure (!) 152/71, pulse (!) 52, temperature 97.9 F (36.6 C), temperature source Oral, resp. rate 18, weight 72.6 kg (160 lb 1.6 oz), SpO2 92 %. PHYSICAL EXAMINATION:  GENERAL:  65 y.o.-year-old patient lying in the bed with no acute distress.  EYES: Pupils equal, round, reactive to light and accommodation. No scleral icterus. Extraocular muscles intact.  HEENT: Head atraumatic, normocephalic. Oropharynx and nasopharynx clear.  NECK:  Supple, no jugular venous distention. No thyroid enlargement, no tenderness.  LUNGS: Normal breath sounds bilaterally, no wheezing, rales,rhonchi or crepitation. No use of accessory muscles of respiration.  CARDIOVASCULAR: S1, S2 normal. No murmurs, rubs, or gallops.  ABDOMEN: Soft, non-tender, non-distended. Bowel sounds present. No organomegaly or mass.  EXTREMITIES: No pedal edema, cyanosis, or clubbing.  NEUROLOGIC: Cranial nerves II through XII are intact. Muscle strength 5/5 in all extremities. Sensation intact. Gait not checked.  PSYCHIATRIC: The patient is alert and oriented x 3.  SKIN: No obvious rash, lesion, or ulcer.  DATA REVIEW:   CBC  Recent Labs Lab 02/12/17 0344  WBC 10.7  HGB 14.4  HCT 42.6  PLT 301    Chemistries   Recent Labs Lab 02/11/17 2247 02/12/17 0344  NA  --  139  K  --  3.7  CL  --  97*  CO2  --  30  GLUCOSE 598* 262*  BUN  --  16  CREATININE  --  0.76  CALCIUM  --  8.7*  MG 1.4*  --      Follow-up Information    Cletis Athens, MD. Schedule an appointment as soon as possible for a visit on 02/23/2017.   Specialty:  Internal Medicine Why:  at 4pm Contact  information: [place] [address] [phone]           Management plans discussed with the patient, family and they are in agreement.  CODE STATUS: Prior   TOTAL TIME TAKING CARE OF THIS PATIENT: 45 minutes.    Max Sane M.D on 02/16/2017 at 6:53  AM  Between 7am to 6pm - Pager - [phone]  After 6pm go to [email or web address] - Proofreader  [facility] [place] [facility]  Office  [phone]  CC: Primary care physician; Cletis Athens, MD   Note: This dictation was prepared with Dragon dictation along with smaller phrase technology. Any transcriptional errors that result from this process are unintentional.

## 2017-03-02 DIAGNOSIS — I10 Essential (primary) hypertension: Secondary | ICD-10-CM | POA: Diagnosis not present

## 2017-03-02 DIAGNOSIS — R000 Tachycardia, unspecified: Secondary | ICD-10-CM | POA: Diagnosis not present

## 2017-03-02 DIAGNOSIS — J449 Chronic obstructive pulmonary disease, unspecified: Secondary | ICD-10-CM | POA: Diagnosis not present

## 2017-03-02 DIAGNOSIS — K210 Gastro-esophageal reflux disease with esophagitis: Secondary | ICD-10-CM | POA: Diagnosis not present

## 2017-03-07 DIAGNOSIS — J449 Chronic obstructive pulmonary disease, unspecified: Secondary | ICD-10-CM | POA: Diagnosis not present

## 2017-03-07 DIAGNOSIS — I6990 Unspecified sequelae of unspecified cerebrovascular disease: Secondary | ICD-10-CM | POA: Diagnosis not present

## 2017-03-07 DIAGNOSIS — I2723 Pulmonary hypertension due to lung diseases and hypoxia: Secondary | ICD-10-CM | POA: Diagnosis not present

## 2017-03-07 DIAGNOSIS — J4520 Mild intermittent asthma, uncomplicated: Secondary | ICD-10-CM | POA: Diagnosis not present

## 2017-03-07 DIAGNOSIS — K210 Gastro-esophageal reflux disease with esophagitis: Secondary | ICD-10-CM | POA: Diagnosis not present

## 2017-03-24 ENCOUNTER — Emergency Department: Payer: Medicare HMO

## 2017-03-24 ENCOUNTER — Inpatient Hospital Stay
Admission: EM | Admit: 2017-03-24 | Discharge: 2017-03-30 | DRG: 177 | Disposition: A | Discharge status: Home Health | Payer: Medicare HMO | Attending: Internal Medicine | Admitting: Internal Medicine

## 2017-03-24 DIAGNOSIS — Z9981 Dependence on supplemental oxygen: Secondary | ICD-10-CM

## 2017-03-24 DIAGNOSIS — G4733 Obstructive sleep apnea (adult) (pediatric): Secondary | ICD-10-CM | POA: Diagnosis present

## 2017-03-24 DIAGNOSIS — K7469 Other cirrhosis of liver: Secondary | ICD-10-CM | POA: Diagnosis present

## 2017-03-24 DIAGNOSIS — Z886 Allergy status to analgesic agent status: Secondary | ICD-10-CM | POA: Diagnosis not present

## 2017-03-24 DIAGNOSIS — G40909 Epilepsy, unspecified, not intractable, without status epilepticus: Secondary | ICD-10-CM | POA: Diagnosis present

## 2017-03-24 DIAGNOSIS — J9600 Acute respiratory failure, unspecified whether with hypoxia or hypercapnia: Secondary | ICD-10-CM | POA: Diagnosis not present

## 2017-03-24 DIAGNOSIS — I10 Essential (primary) hypertension: Secondary | ICD-10-CM | POA: Diagnosis not present

## 2017-03-24 DIAGNOSIS — Z888 Allergy status to other drugs, medicaments and biological substances status: Secondary | ICD-10-CM

## 2017-03-24 DIAGNOSIS — Z8249 Family history of ischemic heart disease and other diseases of the circulatory system: Secondary | ICD-10-CM | POA: Diagnosis not present

## 2017-03-24 DIAGNOSIS — J9621 Acute and chronic respiratory failure with hypoxia: Secondary | ICD-10-CM | POA: Diagnosis present

## 2017-03-24 DIAGNOSIS — Z8673 Personal history of transient ischemic attack (TIA), and cerebral infarction without residual deficits: Secondary | ICD-10-CM | POA: Diagnosis not present

## 2017-03-24 DIAGNOSIS — J9601 Acute respiratory failure with hypoxia: Secondary | ICD-10-CM | POA: Diagnosis not present

## 2017-03-24 DIAGNOSIS — J181 Lobar pneumonia, unspecified organism: Secondary | ICD-10-CM | POA: Diagnosis not present

## 2017-03-24 DIAGNOSIS — J690 Pneumonitis due to inhalation of food and vomit: Principal | ICD-10-CM | POA: Diagnosis present

## 2017-03-24 DIAGNOSIS — J189 Pneumonia, unspecified organism: Secondary | ICD-10-CM | POA: Diagnosis not present

## 2017-03-24 DIAGNOSIS — G2581 Restless legs syndrome: Secondary | ICD-10-CM | POA: Diagnosis present

## 2017-03-24 DIAGNOSIS — Z961 Presence of intraocular lens: Secondary | ICD-10-CM | POA: Diagnosis present

## 2017-03-24 DIAGNOSIS — E876 Hypokalemia: Secondary | ICD-10-CM | POA: Diagnosis present

## 2017-03-24 DIAGNOSIS — K219 Gastro-esophageal reflux disease without esophagitis: Secondary | ICD-10-CM | POA: Diagnosis not present

## 2017-03-24 DIAGNOSIS — R05 Cough: Secondary | ICD-10-CM | POA: Diagnosis not present

## 2017-03-24 DIAGNOSIS — J449 Chronic obstructive pulmonary disease, unspecified: Secondary | ICD-10-CM | POA: Diagnosis not present

## 2017-03-24 DIAGNOSIS — R9431 Abnormal electrocardiogram [ECG] [EKG]: Secondary | ICD-10-CM | POA: Diagnosis present

## 2017-03-24 DIAGNOSIS — R569 Unspecified convulsions: Secondary | ICD-10-CM | POA: Diagnosis not present

## 2017-03-24 DIAGNOSIS — Z880 Allergy status to penicillin: Secondary | ICD-10-CM | POA: Diagnosis not present

## 2017-03-24 DIAGNOSIS — R0902 Hypoxemia: Secondary | ICD-10-CM

## 2017-03-24 DIAGNOSIS — J9690 Respiratory failure, unspecified, unspecified whether with hypoxia or hypercapnia: Secondary | ICD-10-CM

## 2017-03-24 DIAGNOSIS — J441 Chronic obstructive pulmonary disease with (acute) exacerbation: Secondary | ICD-10-CM | POA: Diagnosis not present

## 2017-03-24 DIAGNOSIS — R0602 Shortness of breath: Secondary | ICD-10-CM | POA: Diagnosis not present

## 2017-03-24 DIAGNOSIS — F17210 Nicotine dependence, cigarettes, uncomplicated: Secondary | ICD-10-CM | POA: Diagnosis present

## 2017-03-24 DIAGNOSIS — Z91011 Allergy to milk products: Secondary | ICD-10-CM

## 2017-03-24 DIAGNOSIS — F17200 Nicotine dependence, unspecified, uncomplicated: Secondary | ICD-10-CM | POA: Diagnosis not present

## 2017-03-24 DIAGNOSIS — Z9049 Acquired absence of other specified parts of digestive tract: Secondary | ICD-10-CM | POA: Diagnosis not present

## 2017-03-24 DIAGNOSIS — E875 Hyperkalemia: Secondary | ICD-10-CM | POA: Diagnosis present

## 2017-03-24 DIAGNOSIS — H9190 Unspecified hearing loss, unspecified ear: Secondary | ICD-10-CM | POA: Diagnosis not present

## 2017-03-24 DIAGNOSIS — Z881 Allergy status to other antibiotic agents status: Secondary | ICD-10-CM

## 2017-03-24 DIAGNOSIS — F329 Major depressive disorder, single episode, unspecified: Secondary | ICD-10-CM | POA: Diagnosis not present

## 2017-03-24 DIAGNOSIS — E1140 Type 2 diabetes mellitus with diabetic neuropathy, unspecified: Secondary | ICD-10-CM | POA: Diagnosis present

## 2017-03-24 DIAGNOSIS — J9819 Other pulmonary collapse: Secondary | ICD-10-CM | POA: Diagnosis not present

## 2017-03-24 DIAGNOSIS — J90 Pleural effusion, not elsewhere classified: Secondary | ICD-10-CM | POA: Diagnosis not present

## 2017-03-24 LAB — CBC WITH DIFFERENTIAL/PLATELET
Basophils Absolute: 0.1 10*3/uL (ref 0–0.1)
Basophils Relative: 1 %
EOS ABS: 0.1 10*3/uL (ref 0–0.7)
Eosinophils Relative: 1 %
HCT: 45 % (ref 35.0–47.0)
HEMOGLOBIN: 15.9 g/dL (ref 12.0–16.0)
LYMPHS ABS: 2.4 10*3/uL (ref 1.0–3.6)
LYMPHS PCT: 30 %
MCH: 28.5 pg (ref 26.0–34.0)
MCHC: 35.3 g/dL (ref 32.0–36.0)
MCV: 80.9 fL (ref 80.0–100.0)
Monocytes Absolute: 0.7 10*3/uL (ref 0.2–0.9)
Monocytes Relative: 9 %
NEUTROS ABS: 4.8 10*3/uL (ref 1.4–6.5)
NEUTROS PCT: 59 %
Platelets: 278 10*3/uL (ref 150–440)
RBC: 5.57 MIL/uL — AB (ref 3.80–5.20)
RDW: 13.4 % (ref 11.5–14.5)
WBC: 8 10*3/uL (ref 3.6–11.0)

## 2017-03-24 LAB — BASIC METABOLIC PANEL
Anion gap: 12 (ref 5–15)
BUN: 8 mg/dL (ref 6–20)
CHLORIDE: 96 mmol/L — AB (ref 101–111)
CO2: 31 mmol/L (ref 22–32)
Calcium: 9.8 mg/dL (ref 8.9–10.3)
Creatinine, Ser: 0.42 mg/dL — ABNORMAL LOW (ref 0.44–1.00)
GFR calc Af Amer: >60 mL/min (ref >60)
GFR calc non Af Amer: >60 mL/min (ref >60)
Glucose, Bld: 163 mg/dL — ABNORMAL HIGH (ref 65–99)
POTASSIUM: 2.3 mmol/L — AB (ref 3.5–5.1)
SODIUM: 139 mmol/L (ref 135–145)

## 2017-03-24 LAB — TROPONIN I: Troponin I: 0.03 ng/mL (ref <0.03)

## 2017-03-24 LAB — BRAIN NATRIURETIC PEPTIDE: B NATRIURETIC PEPTIDE 5: 184 pg/mL — AB (ref 0.0–100.0)

## 2017-03-24 LAB — LACTIC ACID, PLASMA: Lactic Acid, Venous: 1.1 mmol/L (ref 0.5–1.9)

## 2017-03-24 MED ORDER — LORAZEPAM 2 MG/ML IJ SOLN
2.0000 mg | Freq: Once | INTRAMUSCULAR | Status: AC
  Administered 2017-03-24: 2 mg via INTRAVENOUS

## 2017-03-24 MED ORDER — POTASSIUM CHLORIDE CRYS ER 20 MEQ PO TBCR
40.0000 meq | EXTENDED_RELEASE_TABLET | Freq: Once | ORAL | Status: DC
  Filled 2017-03-24: qty 2

## 2017-03-24 MED ORDER — SODIUM CHLORIDE 0.9 % IV SOLN
50.0000 mg | Freq: Two times a day (BID) | INTRAVENOUS | Status: DC
  Administered 2017-03-24: 50 mg via INTRAVENOUS
  Filled 2017-03-24 (×2): qty 5

## 2017-03-24 MED ORDER — LORAZEPAM 2 MG/ML IJ SOLN
INTRAMUSCULAR | Status: AC
  Administered 2017-03-24: 2 mg via INTRAVENOUS
  Filled 2017-03-24: qty 1

## 2017-03-24 MED ORDER — CEFTRIAXONE SODIUM IN DEXTROSE 20 MG/ML IV SOLN
1.0000 g | Freq: Once | INTRAVENOUS | Status: AC
  Administered 2017-03-24: 1 g via INTRAVENOUS
  Filled 2017-03-24: qty 50

## 2017-03-24 MED ORDER — DEXTROSE 5 % IV SOLN
500.0000 mg | Freq: Once | INTRAVENOUS | Status: AC
  Administered 2017-03-24: 500 mg via INTRAVENOUS
  Filled 2017-03-24: qty 500

## 2017-03-24 MED ORDER — LORAZEPAM 2 MG/ML IJ SOLN
INTRAMUSCULAR | Status: DC
Start: 2017-03-24 — End: 2017-03-24
  Filled 2017-03-24: qty 1

## 2017-03-24 MED ORDER — DIPHENHYDRAMINE HCL 50 MG/ML IJ SOLN
25.0000 mg | Freq: Once | INTRAMUSCULAR | Status: AC
  Administered 2017-03-24: 25 mg via INTRAVENOUS

## 2017-03-24 MED ORDER — DIPHENHYDRAMINE HCL 50 MG/ML IJ SOLN
INTRAMUSCULAR | Status: AC
  Filled 2017-03-24: qty 1

## 2017-03-24 MED ORDER — KCL IN DEXTROSE-NACL 40-5-0.45 MEQ/L-%-% IV SOLN
INTRAVENOUS | Status: DC
  Administered 2017-03-24: 20:00:00 via INTRAVENOUS
  Administered 2017-03-25: 250 mL/h via INTRAVENOUS
  Administered 2017-03-25: 06:00:00 via INTRAVENOUS
  Filled 2017-03-24 (×10): qty 1000

## 2017-03-24 NOTE — ED Provider Notes (Signed)
[facility] Emergency Department Provider Note       Time seen: ----------------------------------------- 5:25 PM on 03/24/2017 -----------------------------------------     I have reviewed the triage vital signs and the nursing notes.   HISTORY   Chief Complaint Shortness of Breath    HPI [name] is a 66 y.o. female who presents to the ED for shortness of breath and cough since yesterday. Patient reports she also had chills since yesterday and she did vomit this morning. She denies any pain but states that she's been using her inhaler and nebulizer at home with no relief. She does wear oxygen at night but was found be hypoxic on arrival here at 85% on room air.   Past Medical History:  Diagnosis Date  . Asthma   . Cirrhosis, non-alcoholic ([place])   . COPD (chronic obstructive pulmonary disease) ([place])   . Deaf   . Depression   . Diabetes mellitus without complication ([place])   . GERD (gastroesophageal reflux disease)   . Heart murmur   . Hepatitis   . Hypertension   . Lymph node disorder    arm  . Neuropathy   . On home oxygen therapy    hs  . Orthopnea   . RLS (restless legs syndrome)   . Seizures ([place])   . Shortness of breath dyspnea   . Sleep apnea   . Stroke [facility])    tia    Patient Active Problem List   Diagnosis Date Noted  . Hypokalemia 02/12/2017  . Sepsis ([place]) 02/11/2017  . GERD (gastroesophageal reflux disease) 03/23/2016  . Depression 03/23/2016  . Convulsions/seizures ([place]) 03/22/2016  . DM (diabetes mellitus), type 2 ([place]) 03/22/2016  . Essential hypertension 03/22/2016  . Neuropathy 03/22/2016  . Convulsions ([place]) 03/22/2016  . COPD exacerbation ([place]) 03/31/2015  . Acute respiratory failure with hypoxia ([place]) 03/29/2015    Past Surgical History:  Procedure Laterality Date  . CATARACT EXTRACTION W/PHACO Right 11/23/2014   Procedure: CATARACT EXTRACTION PHACO AND INTRAOCULAR LENS PLACEMENT (IOC);  Surgeon:  Lyla Glassing, MD;  Location: [facility] ORS;  Service: Ophthalmology;  Laterality: Right;  . CATARACT EXTRACTION W/PHACO Left 12/14/2014   Procedure: CATARACT EXTRACTION PHACO AND INTRAOCULAR LENS PLACEMENT (IOC);  Surgeon: Lyla Glassing, MD;  Location: [facility] ORS;  Service: Ophthalmology;  Laterality: Left;  US:01:16.6 AP:15.8 CDE:12.14  . CESAREAN SECTION    . CHOLECYSTECTOMY    . KNEE ARTHROPLASTY    . THUMB ARTHROSCOPY    . TONSILLECTOMY    . TYMPANOPLASTY     muliple    Allergies Aspirin; Celebrex [celecoxib]; Ciprofloxacin; Codeine; Fosphenytoin; Levaquin [levofloxacin in d5w]; Levofloxacin; Lovastatin; Penicillins; Pravastatin; and Sulfa antibiotics  Social History Social History  Substance Use Topics  . Smoking status: Current Every Day Smoker    Packs/day: 0.50  . Smokeless tobacco: Never Used  . Alcohol use No    Review of Systems Constitutional: Negative for fever. Eyes: Negative for vision changes ENT:  Negative for congestion, sore throat Cardiovascular: positive for chest pain Respiratory: positive shortness of breath and cough Gastrointestinal: Negative for abdominal pain, positive for vomiting Genitourinary: Negative for dysuria. Musculoskeletal: Negative for back pain. Skin: Negative for rash. Neurological: Negative for headaches, focal weakness or numbness.  All systems negative/normal/unremarkable except as stated in the HPI  ____________________________________________   PHYSICAL EXAM:  VITAL SIGNS: ED Triage Vitals  Enc Vitals Group     BP 03/24/17 1712 (!) 184/104     Pulse Rate 03/24/17 1712 (!) 139  Resp 03/24/17 1712 (!) 23     Temp 03/24/17 1712 97.6 F (36.4 C)     Temp Source 03/24/17 1712 Oral     SpO2 03/24/17 1712 (!) 85 %     Weight 03/24/17 1716 160 lb (72.6 kg)     Height 03/24/17 1716 _0  (1.575 m)     Head Circumference --      Peak Flow --      Pain Score --      Pain Loc --      Pain Edu? --      Excl. in [place]? --      Constitutional: Alert and oriented. Well appearing and in no distress. Eyes: Conjunctivae are normal. Normal extraocular movements. ENT   Head: Normocephalic and atraumatic.   Nose: No congestion/rhinnorhea.   Mouth/Throat: Mucous membranes are moist.   Neck: No stridor. Cardiovascular: Normal rate, regular rhythm. No murmurs, rubs, or gallops. Respiratory: scattered rhonchi, no tachypnea Gastrointestinal: Soft and nontender. Normal bowel sounds Musculoskeletal: Nontender with normal range of motion in extremities. No lower extremity tenderness nor edema. Neurologic:  Normal speech and language. No gross focal neurologic deficits are appreciated.  Skin:  Skin is warm, dry and intact. No rash noted. Psychiatric: Mood and affect are normal. Speech and behavior are normal.  ____________________________________________  EKG: Interpreted by me.sinus rhythm rate 75 bpm, normal PR interval, normal QRS, normal QT. Nonspecific ST segment changes   Repeat EKG interpreted by me, sinus tachycardia with a rate of 125 bpm, normal PR interval, normal rest, normal QT, ST depressions are noted concerning for subendocardial injury  ____________________________________________  ED COURSE:  Pertinent labs & imaging results that were available during my care of the patient were reviewed by me and considered in my medical decision making (see chart for details). Patient presents for shortness of breath and cough, we will assess with labs and imaging as indicated.   Procedures ____________________________________________   LABS (pertinent positives/negatives)  Labs Reviewed  CBC WITH DIFFERENTIAL/PLATELET - Abnormal; Notable for the following:       Result Value   RBC 5.57 (*)    All other components within normal limits  BASIC METABOLIC PANEL - Abnormal; Notable for the following:    Potassium 2.3 (*)    Chloride 96 (*)    Glucose, Bld 163 (*)    Creatinine, Ser 0.42 (*)    All  other components within normal limits  TROPONIN I - Abnormal; Notable for the following:    Troponin I 0.03 (*)    All other components within normal limits  BRAIN NATRIURETIC PEPTIDE  LACTIC ACID, PLASMA  LACTIC ACID, PLASMA   CRITICAL CARE Performed by: Earleen Newport   Total critical care time: 30 minutes  Critical care time was exclusive of separately billable procedures and treating other patients.  Critical care was necessary to treat or prevent imminent or life-threatening deterioration.  Critical care was time spent personally by me on the following activities: development of treatment plan with patient and/or surrogate as well as nursing, discussions with consultants, evaluation of patient's response to treatment, examination of patient, obtaining history from patient or surrogate, ordering and performing treatments and interventions, ordering and review of laboratory studies, ordering and review of radiographic studies, pulse oximetry and re-evaluation of patient's condition.  RADIOLOGY Images were viewed by me  chest x-ray  IMPRESSION: 1. Left lower lobe opacity may reflect atelectasis or a pneumonia. Radiographic follow-up to document resolution is recommended. ____________________________________________  FINAL  ASSESSMENT AND PLAN  dyspnea, pneumonia, hypokalemia   Plan: Patient's labs and imaging were dictated above. Patient had presented for shortness of breath and cough which appears to be from a pneumonia. She is hypoxic when she is not wearing oxygen and typically only wears it at night. She does have significant hypokalemia and will require IV and oral potassium for repletion. I did discuss with the hospitalist for admission.   Earleen Newport, MD   Note: This note was generated in part or whole with voice recognition software. Voice recognition is usually quite accurate but there are transcription errors that can and very often do occur. I  apologize for any typographical errors that were not detected and corrected.     Earleen Newport, MD 03/24/17 [phone]

## 2017-03-24 NOTE — ED Notes (Signed)
Pt reporting itching from azithromycin.  EDP notified and ordered benadryl 5m IV once and to continue medication afterwards.

## 2017-03-24 NOTE — H&P (Signed)
History and Physical   [facility] - [place] @ [facility] Admission History and Physical [employer], D.O.    Patient Name: [name] MR#: [id number] Date of Birth: March 06, 1951 Date of Admission: 03/24/2017  Referring MD/NP/PA: Dr. Jimmye Norman Primary Care Physician: Cletis Athens, MD  Chief Complaint:  Chief Complaint  Patient presents with  . Shortness of Breath  Please note the entire history is obtained from the patient's emergency department chart, emergency department provider. Patient's personal history is limited by altered mental status, postictal  HPI: [name] is a 66 y.o. female with a known history of asthma, home O2-dependent COPD, depression, DM, GERD, HTN, seizure disorder, CVA presents to the emergency department for evaluation of SOB.  Patient was in a usual state of health until yesterday when she described the onset of SOB, coughm chills and vomiting x1.  Symptoms are refractory to home inhalers and nebulaizers.   Of note patient was admitted on 02/11/17 for sepsis 2/2 PNA.    EMS/ED Course: Patient received Rocephin, Azithro, D51/2NS, Benadryl and KCl. Medical admission has been requested for further management of acute on chronic respiratory failure, HCAP, hypokalemia. In the emergency department patient sustained a seizure for which she was given 2 mg of Ativan. Subsequently did not regain consciousness or respond. She continued to have intermittent mild jerking movements. She was given 50 g of IV Vimpat and the decision was made to upgrade the patient to stepdown.  Review of Systems:  Unable to obtain secondary to altered mental status.   Past Medical History:  Diagnosis Date  . Asthma   . Cirrhosis, non-alcoholic ([place])   . COPD (chronic obstructive pulmonary disease) ([place])   . Deaf   . Depression   . Diabetes mellitus without complication ([place])   . GERD (gastroesophageal reflux disease)   . Heart murmur   . Hepatitis   . Hypertension   .  Lymph node disorder    arm  . Neuropathy   . On home oxygen therapy    hs  . Orthopnea   . RLS (restless legs syndrome)   . Seizures ([place])   . Shortness of breath dyspnea   . Sleep apnea   . Stroke [facility])    tia    Past Surgical History:  Procedure Laterality Date  . CATARACT EXTRACTION W/PHACO Right 11/23/2014   Procedure: CATARACT EXTRACTION PHACO AND INTRAOCULAR LENS PLACEMENT (IOC);  Surgeon: Lyla Glassing, MD;  Location: [facility] ORS;  Service: Ophthalmology;  Laterality: Right;  . CATARACT EXTRACTION W/PHACO Left 12/14/2014   Procedure: CATARACT EXTRACTION PHACO AND INTRAOCULAR LENS PLACEMENT (IOC);  Surgeon: Lyla Glassing, MD;  Location: [facility] ORS;  Service: Ophthalmology;  Laterality: Left;  US:01:16.6 AP:15.8 CDE:12.14  . CESAREAN SECTION    . CHOLECYSTECTOMY    . KNEE ARTHROPLASTY    . THUMB ARTHROSCOPY    . TONSILLECTOMY    . TYMPANOPLASTY     muliple     reports that she has been smoking.  She has been smoking about 0.50 packs per day. She has never used smokeless tobacco. She reports that she does not drink alcohol or use drugs.  Allergies  Allergen Reactions  . Aspirin Itching  . Celebrex [Celecoxib] Itching    itching  . Ciprofloxacin Itching  . Codeine Itching  . Fosphenytoin Itching  . Levaquin [Levofloxacin In D5w] Itching  . Levofloxacin Itching  . Lovastatin Itching  . Penicillins     Has patient had a PCN reaction causing immediate rash, facial/tongue/throat swelling,  SOB or lightheadedness with hypotension: Yes Has patient had a PCN reaction causing severe rash involving mucus membranes or skin necrosis: Yes Has patient had a PCN reaction that required hospitalization: NO Has patient had a PCN reaction occurring within the last 10 years: NO If all of the above answers are "NO", then may proceed with Cephalosporin use.   . Pravastatin Itching  . Sulfa Antibiotics Itching    Family History  Problem Relation Age of Onset  . Lung cancer Mother    . CAD Father     Prior to Admission medications   Medication Sig Start Date End Date Taking? Authorizing Provider  albuterol (PROVENTIL HFA;VENTOLIN HFA) 108 (90 BASE) MCG/ACT inhaler Inhale 2 puffs into the lungs every 4 (four) hours as needed for wheezing or shortness of breath.   Yes [provider]  albuterol (PROVENTIL) (2.5 MG/3ML) 0.083% nebulizer solution Take 3 mLs (2.5 mg total) by nebulization every 4 (four) hours as needed for wheezing or shortness of breath. 07/17/16  Yes Vaughan Basta, MD  citalopram (CELEXA) 20 MG tablet Take 1 tablet (20 mg total) by mouth daily. Patient taking differently: Take 20 mg by mouth at bedtime.  07/17/16  Yes Vaughan Basta, MD  clopidogrel (PLAVIX) 75 MG tablet Take 1 tablet (75 mg total) by mouth daily. 07/17/16  Yes Vaughan Basta, MD  diltiazem (DILACOR XR) 180 MG 24 hr capsule Take 180 mg by mouth daily.   Yes [provider]  EPINEPHrine 0.3 mg/0.3 mL IJ SOAJ injection Inject into the muscle once.   Yes [provider]  furosemide (LASIX) 40 MG tablet Take 1 tablet (40 mg total) by mouth daily. 07/17/16  Yes Vaughan Basta, MD  gabapentin (NEURONTIN) 300 MG capsule Take 1 capsule by mouth 2 (two) times daily.  01/16/16  Yes [provider]  GLIPIZIDE XL 5 MG 24 hr tablet Take 2 tablets (10 mg total) by mouth daily. Patient taking differently: Take 5 mg by mouth daily.  02/12/17  Yes Max Sane, MD  lacosamide (VIMPAT) 50 MG TABS tablet Take 1 tablet (50 mg total) by mouth 2 (two) times daily. 07/17/16  Yes Vaughan Basta, MD  losartan-hydrochlorothiazide (HYZAAR) 100-12.5 MG tablet Take 1 tablet by mouth daily.   Yes [provider]  mometasone-formoterol (DULERA) 200-5 MCG/ACT AERO Inhale 2 puffs into the lungs 2 (two) times daily. 07/17/16  Yes Vaughan Basta, MD  montelukast (SINGULAIR) 10 MG tablet Take 1 tablet by mouth at bedtime. 03/18/16  Yes [provider]  omeprazole (PRILOSEC) 20 MG capsule Take 20 mg by mouth daily.   Yes [provider]  rOPINIRole (REQUIP) 0.25 MG tablet Take 1 tablet (0.25 mg total) by mouth 3 (three) times daily. 03/24/16  Yes Oswald Hillock, MD    Physical Exam: Vitals:   03/24/17 1716 03/24/17 1800 03/24/17 1830 03/24/17 1900  BP:  (!) 152/73 (!) 150/67 (!) 163/59  Pulse:  (!) 58 64 71  Resp:  (!) 26 (!) 36 (!) 25  Temp:      TempSrc:      SpO2:  97% (!) 89% 92%  Weight: 72.6 kg (160 lb)     Height: _0  (1.575 m)       GENERAL: 66 y.o.-year-old female patient, well-developed, well-nourished lying in the bed in no acute distress.  Mild psychomotor agitation.  Unresponsive to verbal or tactile stimuli.  Withdraws to pain.  HEENT: Head atraumatic, normocephalic. Pupils equal. Mucus membranes moist. NECK: Supple, full range  of motion.  CHEST: Bibasilar rhonchi, slight diffuse wheeze.   No use of accessory muscles of respiration.  No reproducible chest wall tenderness.  CARDIOVASCULAR: S1, S2 normal. No murmurs, rubs, or gallops. Cap refill <2 seconds. Pulses intact distally.  ABDOMEN: Soft, nondistended, nontender. No rebound, guarding, rigidity. Normoactive bowel sounds present in all four quadrants.  EXTREMITIES: No pedal edema, cyanosis, or clubbing. No calf tenderness or Homan's sign.  NEUROLOGIC: The patient is unresponsive. Cannot comply with exam.  SKIN: Warm, dry, and intact without obvious rash, lesion, or ulcer.    Labs on Admission:  CBC:  Recent Labs Lab 03/24/17 1738  WBC 8.0  NEUTROABS 4.8  HGB 15.9  HCT 45.0  MCV 80.9  PLT 631   Basic Metabolic Panel:  Recent Labs Lab 03/24/17 1738  NA 139  K 2.3*  CL 96*  CO2 31  GLUCOSE 163*  BUN 8  CREATININE 0.42*  CALCIUM 9.8   GFR: Estimated Creatinine Clearance: 65.4 mL/min (A) (by C-G formula based on SCr of 0.42 mg/dL (L)). Liver Function Tests: No results for input(s): AST, ALT, ALKPHOS, BILITOT, PROT,  ALBUMIN in the last 168 hours. No results for input(s): LIPASE, AMYLASE in the last 168 hours. No results for input(s): AMMONIA in the last 168 hours. Coagulation Profile: No results for input(s): INR, PROTIME in the last 168 hours. Cardiac Enzymes:  Recent Labs Lab 03/24/17 1738  TROPONINI 0.03*   BNP (last 3 results) No results for input(s): PROBNP in the last 8760 hours. HbA1C: No results for input(s): HGBA1C in the last 72 hours. CBG: No results for input(s): GLUCAP in the last 168 hours. Lipid Profile: No results for input(s): CHOL, HDL, LDLCALC, TRIG, CHOLHDL, LDLDIRECT in the last 72 hours. Thyroid Function Tests: No results for input(s): TSH, T4TOTAL, FREET4, T3FREE, THYROIDAB in the last 72 hours. Anemia Panel: No results for input(s): VITAMINB12, FOLATE, FERRITIN, TIBC, IRON, RETICCTPCT in the last 72 hours. Urine analysis:    Component Value Date/Time   COLORURINE AMBER (A) 02/06/2017 1109   APPEARANCEUR HAZY (A) 02/06/2017 1109   APPEARANCEUR Clear 11/14/2011 1607   LABSPEC 1.025 02/06/2017 1109   LABSPEC 1.006 11/14/2011 1607   PHURINE 5.0 02/06/2017 1109   GLUCOSEU 50 (A) 02/06/2017 1109   GLUCOSEU Negative 11/14/2011 1607   HGBUR NEGATIVE 02/06/2017 1109   BILIRUBINUR SMALL (A) 02/06/2017 1109   BILIRUBINUR Negative 11/14/2011 1607   KETONESUR NEGATIVE 02/06/2017 1109   PROTEINUR 30 (A) 02/06/2017 1109   NITRITE NEGATIVE 02/06/2017 1109   LEUKOCYTESUR NEGATIVE 02/06/2017 1109   LEUKOCYTESUR Negative 11/14/2011 1607   Sepsis Labs: _0 (procalcitonin:4,lacticidven:4) )No results found for this or any previous visit (from the past 240 hour(s)).   Radiological Exams on Admission: Dg Chest 2 View  Result Date: 03/24/2017 CLINICAL DATA:  Shortness of breath and cough EXAM: CHEST  2 VIEW COMPARISON:  02/11/2017 FINDINGS: Obscuration of the left diaphragm since the previous exam. No pleural effusion. Stable cardiomediastinal silhouette with aortic  atherosclerosis. No pneumothorax. Degenerative changes of the spine. Surgical clips in the right upper quadrant IMPRESSION: 1. Left lower lobe opacity may reflect atelectasis or a pneumonia. Radiographic follow-up to document resolution is recommended. Electronically Signed   By: Donavan Foil M.D.   On: 03/24/2017 17:57    EKG: Sinus tach at 125bpm bpm with normal axis, ST depressions in II, III, aVF, V2, V3 and nonspecific ST-T wave changes.   Assessment/Plan  This is a 66 y.o. female with a history of asthma, home O2-dependent  COPD, depression, DM, GERD, HTN, seizure disorder, CVA now being admitted with:  #. Acute on chronic respiratory failure with hypoxia 2/2 healthcare associated aneumonia in a patient with COPD - Admit to inpatient, stepdown, continuous pulse ox - IV Azactam & Vancomycin - IV fluid hydration - Duonebs, expectorants & O2 therapy as needed - Continue Dulera, Singulair - Check procal - Follow up blood & sputum cultures - CCM consulted  #.  Seizure with concern for status epilepticus - Patient was given Ativan 46m IV and Vimpat 515mIV in ED - Ativan PRN - Seizure and aspiration precautions.   - Neurochecks   #. Hypokalemia, severe - Continue IV repletion initiated in ED. - Check mag level - Follow BMP in AM  #. Abnormal EKG, concerning for myocardial ischemia - Monitor on telemetry - Trend troponins  #. History of HTN - Continue diltiazem, Hyzaar, Lasix  #. History of depression - Continue Celexa  #. History of RLS - Continue Requip  #. History of GERD - Continue Protonix for Prilosec  #. History of DM - Accuchecks achs with RISS  Admission status: Inpatient stepdown, tele IV Fluids: NS Diet/Nutrition: NPO Consults called: None  DVT Px: Lovenox, SCDs and early ambulation. Code Status: Full Code  Disposition Plan: To home in 2-3 days  All the records are reviewed and case discussed with ED provider. Management plans discussed with the  patient and/or family who express understanding and agree with plan of care.  Gunhild Bautch D.O. on 03/24/2017 at 8:23 PM Between 7am to 6pm - Pager - [phone] After 6pm go to [email or web address] - [email or web address] [place] [facility] Office [phone]: Primary care physician; MaCletis AthensMD   03/24/2017, 8:23 PM

## 2017-03-24 NOTE — ED Notes (Signed)
This RN at patients bedside, noted that patient was having all over shaking, eyes rolled back in head.  Dr. Alfred Levins brought to bedside and VO given for 48m IV push ativan.  Dr. HAra Kussmaulmade aware of seizure episode as well.

## 2017-03-24 NOTE — ED Triage Notes (Signed)
Pt is deaf, pt able to understand lips and mouth words back to this RN at this time.  Pt states that she has shortness of breath and a cough since yesterday. Pt reports that she has also had chills since yesterday.  Pt denies pain.  Pt states she uses a nebulizer and an inhaler at home with no relief.

## 2017-03-25 DIAGNOSIS — J9621 Acute and chronic respiratory failure with hypoxia: Secondary | ICD-10-CM

## 2017-03-25 DIAGNOSIS — R569 Unspecified convulsions: Secondary | ICD-10-CM

## 2017-03-25 DIAGNOSIS — J189 Pneumonia, unspecified organism: Secondary | ICD-10-CM

## 2017-03-25 DIAGNOSIS — J181 Lobar pneumonia, unspecified organism: Secondary | ICD-10-CM

## 2017-03-25 DIAGNOSIS — E876 Hypokalemia: Secondary | ICD-10-CM

## 2017-03-25 DIAGNOSIS — R0902 Hypoxemia: Secondary | ICD-10-CM

## 2017-03-25 LAB — COMPREHENSIVE METABOLIC PANEL
ALT: 11 U/L — AB (ref 14–54)
AST: 23 U/L (ref 15–41)
Albumin: 3 g/dL — ABNORMAL LOW (ref 3.5–5.0)
Alkaline Phosphatase: 94 U/L (ref 38–126)
Anion gap: 10 (ref 5–15)
BUN: 6 mg/dL (ref 6–20)
CHLORIDE: 100 mmol/L — AB (ref 101–111)
CO2: 29 mmol/L (ref 22–32)
CREATININE: 0.45 mg/dL (ref 0.44–1.00)
Calcium: 8.6 mg/dL — ABNORMAL LOW (ref 8.9–10.3)
GFR calc Af Amer: >60 mL/min (ref >60)
Glucose, Bld: 179 mg/dL — ABNORMAL HIGH (ref 65–99)
Potassium: 3.1 mmol/L — ABNORMAL LOW (ref 3.5–5.1)
Sodium: 139 mmol/L (ref 135–145)
Total Bilirubin: 0.6 mg/dL (ref 0.3–1.2)
Total Protein: 5.9 g/dL — ABNORMAL LOW (ref 6.5–8.1)

## 2017-03-25 LAB — GLUCOSE, CAPILLARY
GLUCOSE-CAPILLARY: 186 mg/dL — AB (ref 65–99)
GLUCOSE-CAPILLARY: 192 mg/dL — AB (ref 65–99)
Glucose-Capillary: 174 mg/dL — ABNORMAL HIGH (ref 65–99)
Glucose-Capillary: 237 mg/dL — ABNORMAL HIGH (ref 65–99)
Glucose-Capillary: 113 mg/dL — ABNORMAL HIGH (ref 65–99)

## 2017-03-25 LAB — CBC
HCT: 40.7 % (ref 35.0–47.0)
HEMOGLOBIN: 14.4 g/dL (ref 12.0–16.0)
MCH: 28.9 pg (ref 26.0–34.0)
MCHC: 35.4 g/dL (ref 32.0–36.0)
MCV: 81.7 fL (ref 80.0–100.0)
Platelets: 230 10*3/uL (ref 150–440)
RBC: 4.98 MIL/uL (ref 3.80–5.20)
RDW: 13.5 % (ref 11.5–14.5)
WBC: 7.8 10*3/uL (ref 3.6–11.0)

## 2017-03-25 LAB — TROPONIN I: Troponin I: <0.03 ng/mL (ref <0.03)

## 2017-03-25 LAB — PROCALCITONIN

## 2017-03-25 LAB — MRSA PCR SCREENING: MRSA BY PCR: NEGATIVE

## 2017-03-25 LAB — STREP PNEUMONIAE URINARY ANTIGEN: STREP PNEUMO URINARY ANTIGEN: NEGATIVE

## 2017-03-25 LAB — LACTIC ACID, PLASMA: Lactic Acid, Venous: 1.8 mmol/L (ref 0.5–1.9)

## 2017-03-25 LAB — PHOSPHORUS: Phosphorus: 3 mg/dL (ref 2.5–4.6)

## 2017-03-25 LAB — MAGNESIUM: Magnesium: 1.4 mg/dL — ABNORMAL LOW (ref 1.7–2.4)

## 2017-03-25 MED ORDER — DILTIAZEM HCL ER COATED BEADS 180 MG PO CP24
180.0000 mg | ORAL_CAPSULE | Freq: Every day | ORAL | Status: DC
  Administered 2017-03-25 – 2017-03-30 (×6): 180 mg via ORAL
  Filled 2017-03-25 (×7): qty 1

## 2017-03-25 MED ORDER — ONDANSETRON HCL 4 MG PO TABS: 4.0000 mg | ORAL_TABLET | Freq: Four times a day (QID) | ORAL | Status: DC | PRN

## 2017-03-25 MED ORDER — ACETAMINOPHEN 325 MG PO TABS
650.0000 mg | ORAL_TABLET | Freq: Four times a day (QID) | ORAL | Status: DC | PRN
Start: 2017-03-25 — End: 2017-03-30

## 2017-03-25 MED ORDER — POTASSIUM CHLORIDE CRYS ER 20 MEQ PO TBCR
40.0000 meq | EXTENDED_RELEASE_TABLET | ORAL | Status: DC
  Administered 2017-03-25 (×3): 40 meq via ORAL
  Filled 2017-03-25 (×2): qty 2

## 2017-03-25 MED ORDER — GABAPENTIN 300 MG PO CAPS
300.0000 mg | ORAL_CAPSULE | Freq: Two times a day (BID) | ORAL | Status: DC
  Administered 2017-03-25 – 2017-03-30 (×11): 300 mg via ORAL
  Filled 2017-03-25 (×11): qty 1

## 2017-03-25 MED ORDER — ALBUTEROL SULFATE (2.5 MG/3ML) 0.083% IN NEBU
2.5000 mg | INHALATION_SOLUTION | RESPIRATORY_TRACT | Status: DC | PRN
Start: 2017-03-25 — End: 2017-03-26
  Administered 2017-03-26: 2.5 mg via RESPIRATORY_TRACT
  Filled 2017-03-25: qty 3

## 2017-03-25 MED ORDER — DEXTROSE 5 % IV SOLN
2.0000 g | Freq: Three times a day (TID) | INTRAVENOUS | Status: DC
  Administered 2017-03-25: 2 g via INTRAVENOUS
  Filled 2017-03-25 (×3): qty 2

## 2017-03-25 MED ORDER — MAGNESIUM CITRATE PO SOLN
1.0000 | Freq: Once | ORAL | Status: DC | PRN
  Filled 2017-03-25: qty 296

## 2017-03-25 MED ORDER — POTASSIUM CHLORIDE 10 MEQ/100ML IV SOLN
10.0000 meq | INTRAVENOUS | Status: AC
  Administered 2017-03-25 (×4): 10 meq via INTRAVENOUS
  Filled 2017-03-25 (×4): qty 100

## 2017-03-25 MED ORDER — CLOPIDOGREL BISULFATE 75 MG PO TABS
75.0000 mg | ORAL_TABLET | Freq: Every day | ORAL | Status: DC
  Administered 2017-03-25 – 2017-03-30 (×6): 75 mg via ORAL
  Filled 2017-03-25 (×6): qty 1

## 2017-03-25 MED ORDER — SENNOSIDES-DOCUSATE SODIUM 8.6-50 MG PO TABS: 1.0000 | ORAL_TABLET | Freq: Every evening | ORAL | Status: DC | PRN

## 2017-03-25 MED ORDER — LORAZEPAM 2 MG/ML IJ SOLN
2.0000 mg | Freq: Once | INTRAMUSCULAR | Status: AC | PRN
  Administered 2017-03-25: 1 mg via INTRAVENOUS
  Filled 2017-03-25: qty 1

## 2017-03-25 MED ORDER — ACETAMINOPHEN 650 MG RE SUPP: 650.0000 mg | Freq: Four times a day (QID) | RECTAL | Status: DC | PRN

## 2017-03-25 MED ORDER — BISACODYL 5 MG PO TBEC: 5.0000 mg | DELAYED_RELEASE_TABLET | Freq: Every day | ORAL | Status: DC | PRN

## 2017-03-25 MED ORDER — SODIUM CHLORIDE 0.9 % IV SOLN
50.0000 mg | Freq: Two times a day (BID) | INTRAVENOUS | Status: DC
  Administered 2017-03-25: 50 mg via INTRAVENOUS
  Filled 2017-03-25 (×3): qty 5

## 2017-03-25 MED ORDER — ENOXAPARIN SODIUM 40 MG/0.4ML ~~LOC~~ SOLN
40.0000 mg | SUBCUTANEOUS | Status: DC
  Administered 2017-03-25 – 2017-03-29 (×5): 40 mg via SUBCUTANEOUS
  Filled 2017-03-25 (×5): qty 0.4

## 2017-03-25 MED ORDER — ROPINIROLE HCL 0.25 MG PO TABS
0.2500 mg | ORAL_TABLET | Freq: Three times a day (TID) | ORAL | Status: DC
  Administered 2017-03-25 – 2017-03-30 (×16): 0.25 mg via ORAL
  Filled 2017-03-25 (×18): qty 1

## 2017-03-25 MED ORDER — SODIUM CHLORIDE 0.9 % IV SOLN
INTRAVENOUS | Status: DC
  Administered 2017-03-25: 05:00:00 via INTRAVENOUS

## 2017-03-25 MED ORDER — MONTELUKAST SODIUM 10 MG PO TABS
10.0000 mg | ORAL_TABLET | Freq: Every day | ORAL | Status: DC
  Administered 2017-03-25: 10 mg via ORAL
  Filled 2017-03-25: qty 1

## 2017-03-25 MED ORDER — LACOSAMIDE 50 MG PO TABS
50.0000 mg | ORAL_TABLET | Freq: Two times a day (BID) | ORAL | Status: DC
Start: 2017-03-25 — End: 2017-03-25

## 2017-03-25 MED ORDER — SODIUM CHLORIDE 0.9 % IV SOLN
100.0000 mg | Freq: Two times a day (BID) | INTRAVENOUS | Status: AC
  Administered 2017-03-25 – 2017-03-26 (×2): 100 mg via INTRAVENOUS
  Filled 2017-03-25 (×4): qty 10

## 2017-03-25 MED ORDER — DEXTROSE 5 % IV SOLN
1.0000 g | INTRAVENOUS | Status: DC
  Administered 2017-03-25 – 2017-03-26 (×2): 1 g via INTRAVENOUS
  Filled 2017-03-25 (×3): qty 10

## 2017-03-25 MED ORDER — POTASSIUM CHLORIDE CRYS ER 20 MEQ PO TBCR
40.0000 meq | EXTENDED_RELEASE_TABLET | Freq: Two times a day (BID) | ORAL | Status: DC
  Filled 2017-03-25 (×2): qty 2

## 2017-03-25 MED ORDER — VANCOMYCIN HCL IN DEXTROSE 1-5 GM/200ML-% IV SOLN
1000.0000 mg | Freq: Once | INTRAVENOUS | Status: AC
  Administered 2017-03-25: 1000 mg via INTRAVENOUS
  Filled 2017-03-25: qty 200

## 2017-03-25 MED ORDER — VANCOMYCIN HCL IN DEXTROSE 1-5 GM/200ML-% IV SOLN
1000.0000 mg | INTRAVENOUS | Status: DC
Start: 2017-03-25 — End: 2017-03-25
  Filled 2017-03-25: qty 200

## 2017-03-25 MED ORDER — LOSARTAN POTASSIUM 50 MG PO TABS
100.0000 mg | ORAL_TABLET | Freq: Every day | ORAL | Status: DC
  Administered 2017-03-25 – 2017-03-30 (×6): 100 mg via ORAL
  Filled 2017-03-25 (×6): qty 2

## 2017-03-25 MED ORDER — MOMETASONE FURO-FORMOTEROL FUM 200-5 MCG/ACT IN AERO
2.0000 | INHALATION_SPRAY | Freq: Two times a day (BID) | RESPIRATORY_TRACT | Status: DC
  Administered 2017-03-25 – 2017-03-26 (×2): 2 via RESPIRATORY_TRACT
  Filled 2017-03-25: qty 8.8

## 2017-03-25 MED ORDER — ONDANSETRON HCL 4 MG/2ML IJ SOLN
4.0000 mg | Freq: Four times a day (QID) | INTRAMUSCULAR | Status: DC | PRN
  Administered 2017-03-29: 4 mg via INTRAVENOUS
  Filled 2017-03-25 (×2): qty 2

## 2017-03-25 MED ORDER — IPRATROPIUM BROMIDE 0.02 % IN SOLN: 0.5000 mg | Freq: Four times a day (QID) | RESPIRATORY_TRACT | Status: DC | PRN

## 2017-03-25 MED ORDER — SODIUM CHLORIDE 0.9% FLUSH
3.0000 mL | Freq: Two times a day (BID) | INTRAVENOUS | Status: DC
  Administered 2017-03-25 – 2017-03-30 (×11): 3 mL via INTRAVENOUS

## 2017-03-25 MED ORDER — INSULIN ASPART 100 UNIT/ML ~~LOC~~ SOLN
0.0000 [IU] | Freq: Three times a day (TID) | SUBCUTANEOUS | Status: DC
  Administered 2017-03-25 (×2): 3 [IU] via SUBCUTANEOUS
  Administered 2017-03-25: 5 [IU] via SUBCUTANEOUS
  Administered 2017-03-26: 3 [IU] via SUBCUTANEOUS
  Administered 2017-03-26 (×2): 2 [IU] via SUBCUTANEOUS
  Administered 2017-03-27 (×3): 3 [IU] via SUBCUTANEOUS
  Administered 2017-03-28: 2 [IU] via SUBCUTANEOUS
  Administered 2017-03-28 – 2017-03-29 (×3): 3 [IU] via SUBCUTANEOUS
  Administered 2017-03-29: 5 [IU] via SUBCUTANEOUS
  Administered 2017-03-29: 8 [IU] via SUBCUTANEOUS
  Administered 2017-03-30 (×2): 3 [IU] via SUBCUTANEOUS
  Filled 2017-03-25 (×17): qty 1

## 2017-03-25 MED ORDER — HYDROCHLOROTHIAZIDE 12.5 MG PO CAPS
12.5000 mg | ORAL_CAPSULE | Freq: Every day | ORAL | Status: DC
  Administered 2017-03-25 – 2017-03-30 (×6): 12.5 mg via ORAL
  Filled 2017-03-25 (×6): qty 1

## 2017-03-25 MED ORDER — ALBUTEROL SULFATE (2.5 MG/3ML) 0.083% IN NEBU: 2.5000 mg | INHALATION_SOLUTION | Freq: Four times a day (QID) | RESPIRATORY_TRACT | Status: DC | PRN

## 2017-03-25 MED ORDER — LOSARTAN POTASSIUM-HCTZ 100-12.5 MG PO TABS: 1.0000 | ORAL_TABLET | Freq: Every day | ORAL | Status: DC

## 2017-03-25 MED ORDER — CITALOPRAM HYDROBROMIDE 20 MG PO TABS
20.0000 mg | ORAL_TABLET | Freq: Every day | ORAL | Status: DC
  Administered 2017-03-25 – 2017-03-29 (×5): 20 mg via ORAL
  Filled 2017-03-25 (×5): qty 1

## 2017-03-25 MED ORDER — FUROSEMIDE 40 MG PO TABS
40.0000 mg | ORAL_TABLET | Freq: Every day | ORAL | Status: DC
  Administered 2017-03-25 – 2017-03-30 (×6): 40 mg via ORAL
  Filled 2017-03-25: qty 2
  Filled 2017-03-25: qty 1
  Filled 2017-03-25 (×4): qty 2

## 2017-03-25 MED ORDER — INSULIN ASPART 100 UNIT/ML ~~LOC~~ SOLN
0.0000 [IU] | Freq: Every day | SUBCUTANEOUS | Status: DC
  Administered 2017-03-26 – 2017-03-29 (×2): 2 [IU] via SUBCUTANEOUS
  Filled 2017-03-25 (×2): qty 1

## 2017-03-25 MED ORDER — PANTOPRAZOLE SODIUM 40 MG PO TBEC
40.0000 mg | DELAYED_RELEASE_TABLET | Freq: Every day | ORAL | Status: DC
  Administered 2017-03-25 – 2017-03-30 (×6): 40 mg via ORAL
  Filled 2017-03-25 (×6): qty 1

## 2017-03-25 MED ORDER — MAGNESIUM SULFATE 4 GM/100ML IV SOLN
4.0000 g | Freq: Once | INTRAVENOUS | Status: AC
  Administered 2017-03-25: 4 g via INTRAVENOUS
  Filled 2017-03-25: qty 100

## 2017-03-25 NOTE — Consult Note (Signed)
Reason for Consult:Seizures Referring Physician: Mody  CC: Seizures  HPI: [name] is an 66 y.o. female with a history of seizures, followed by Dr. Manuella Ghazi on an outpatient basis on Vimpat.  Has been on Keppra in the past.  Presented to the ED with SOB.  While being evaluated in the ED was noted to have a seizure.  Patient was loaded with Vimpat.   Patient reports being compliant with medications prior to admission.  It appears from the documentation that the patient often has complaints of SOB prior to her seizures and it has been postulated that hypoxemia precipitates their occurrence.    Past Medical History:  Diagnosis Date  . Asthma   . Cirrhosis, non-alcoholic ([place])   . COPD (chronic obstructive pulmonary disease) ([place])   . Deaf   . Depression   . Diabetes mellitus without complication ([place])   . GERD (gastroesophageal reflux disease)   . Heart murmur   . Hepatitis   . Hypertension   . Lymph node disorder    arm  . Neuropathy   . On home oxygen therapy    hs  . Orthopnea   . RLS (restless legs syndrome)   . Seizures ([place])   . Shortness of breath dyspnea   . Sleep apnea   . Stroke [facility])    tia    Past Surgical History:  Procedure Laterality Date  . CATARACT EXTRACTION W/PHACO Right 11/23/2014   Procedure: CATARACT EXTRACTION PHACO AND INTRAOCULAR LENS PLACEMENT (IOC);  Surgeon: Lyla Glassing, MD;  Location: [facility] ORS;  Service: Ophthalmology;  Laterality: Right;  . CATARACT EXTRACTION W/PHACO Left 12/14/2014   Procedure: CATARACT EXTRACTION PHACO AND INTRAOCULAR LENS PLACEMENT (IOC);  Surgeon: Lyla Glassing, MD;  Location: [facility] ORS;  Service: Ophthalmology;  Laterality: Left;  US:01:16.6 AP:15.8 CDE:12.14  . CESAREAN SECTION    . CHOLECYSTECTOMY    . KNEE ARTHROPLASTY    . THUMB ARTHROSCOPY    . TONSILLECTOMY    . TYMPANOPLASTY     muliple    Family History  Problem Relation Age of Onset  . Lung cancer Mother   . CAD Father     Social History:  reports  that she has been smoking.  She has been smoking about 0.50 packs per day. She has never used smokeless tobacco. She reports that she does not drink alcohol or use drugs.  Allergies  Allergen Reactions  . Aspirin Itching  . Celebrex [Celecoxib] Itching    itching  . Ciprofloxacin Itching  . Codeine Itching  . Fosphenytoin Itching  . Levaquin [Levofloxacin In D5w] Itching  . Levofloxacin Itching  . Lovastatin Itching  . Penicillins     Documentation indicates severe reaction  Pt tolerated cephalosporin without adverse reaction 09/18   . Pravastatin Itching  . Sulfa Antibiotics Itching    Medications:  I have reviewed the patient's current medications. Prior to Admission:  Prescriptions Prior to Admission  Medication Sig Dispense Refill Last Dose  . albuterol (PROVENTIL HFA;VENTOLIN HFA) 108 (90 BASE) MCG/ACT inhaler Inhale 2 puffs into the lungs every 4 (four) hours as needed for wheezing or shortness of breath.   prn at prn  . albuterol (PROVENTIL) (2.5 MG/3ML) 0.083% nebulizer solution Take 3 mLs (2.5 mg total) by nebulization every 4 (four) hours as needed for wheezing or shortness of breath. 90 mL 2 prn at prn  . citalopram (CELEXA) 20 MG tablet Take 1 tablet (20 mg total) by mouth daily. (Patient taking differently: Take 20 mg  by mouth at bedtime. ) 30 tablet 0 03/23/2017 at Unknown time  . clopidogrel (PLAVIX) 75 MG tablet Take 1 tablet (75 mg total) by mouth daily. 30 tablet 1 03/23/2017 at Unknown time  . diltiazem (DILACOR XR) 180 MG 24 hr capsule Take 180 mg by mouth daily.   03/23/2017 at Unknown time  . EPINEPHrine 0.3 mg/0.3 mL IJ SOAJ injection Inject into the muscle once.   prn at prn  . furosemide (LASIX) 40 MG tablet Take 1 tablet (40 mg total) by mouth daily. 30 tablet 0 03/23/2017 at Unknown time  . gabapentin (NEURONTIN) 300 MG capsule Take 1 capsule by mouth 2 (two) times daily.   1 03/23/2017 at Unknown time  . GLIPIZIDE XL 5 MG 24 hr tablet Take 2 tablets (10 mg  total) by mouth daily. (Patient taking differently: Take 5 mg by mouth daily. ) 30 tablet 0 03/23/2017 at Unknown time  . lacosamide (VIMPAT) 50 MG TABS tablet Take 1 tablet (50 mg total) by mouth 2 (two) times daily. 60 tablet 0 03/23/2017 at Unknown time  . losartan-hydrochlorothiazide (HYZAAR) 100-12.5 MG tablet Take 1 tablet by mouth daily.   03/23/2017 at Unknown time  . mometasone-formoterol (DULERA) 200-5 MCG/ACT AERO Inhale 2 puffs into the lungs 2 (two) times daily. 1 Inhaler 1 Past Week at Unknown time  . montelukast (SINGULAIR) 10 MG tablet Take 1 tablet by mouth at bedtime.  4 03/23/2017 at Unknown time  . omeprazole (PRILOSEC) 20 MG capsule Take 20 mg by mouth daily.   03/23/2017 at Unknown time  . rOPINIRole (REQUIP) 0.25 MG tablet Take 1 tablet (0.25 mg total) by mouth 3 (three) times daily. 90 tablet 2 03/23/2017 at Unknown time   Scheduled: . citalopram  20 mg Oral QHS  . clopidogrel  75 mg Oral Daily  . diltiazem  180 mg Oral Daily  . enoxaparin (LOVENOX) injection  40 mg Subcutaneous Q24H  . furosemide  40 mg Oral Daily  . gabapentin  300 mg Oral BID  . losartan  100 mg Oral Daily   And  . hydrochlorothiazide  12.5 mg Oral Daily  . insulin aspart  0-15 Units Subcutaneous TID WC  . insulin aspart  0-5 Units Subcutaneous QHS  . mometasone-formoterol  2 puff Inhalation BID  . montelukast  10 mg Oral QHS  . pantoprazole  40 mg Oral Daily  . potassium chloride  40 mEq Oral Q4H  . rOPINIRole  0.25 mg Oral TID  . sodium chloride flush  3 mL Intravenous Q12H    ROS: History obtained from the patient  General ROS: negative for - chills, fatigue, fever, night sweats, weight gain or weight loss Psychological ROS: negative for - behavioral disorder, hallucinations, memory difficulties, mood swings or suicidal ideation Ophthalmic ROS: negative for - blurry vision, double vision, eye pain or loss of vision ENT ROS: deaf Allergy and Immunology ROS: negative for - hives or  itchy/watery eyes Hematological and Lymphatic ROS: negative for - bleeding problems, bruising or swollen lymph nodes Endocrine ROS: negative for - galactorrhea, hair pattern changes, polydipsia/polyuria or temperature intolerance Respiratory ROS: shortness of breath  Cardiovascular ROS: negative for - chest pain, dyspnea on exertion, edema or irregular heartbeat Gastrointestinal ROS: negative for - abdominal pain, diarrhea, hematemesis, nausea/vomiting or stool incontinence Genito-Urinary ROS: negative for - dysuria, hematuria, incontinence or urinary frequency/urgency Musculoskeletal ROS: negative for - joint swelling or muscular weakness Neurological ROS: as noted in HPI Dermatological ROS: negative for rash and skin lesion  changes  Physical Examination: Blood pressure (!) 186/88, pulse 63, temperature 97.6 F (36.4 C), temperature source Axillary, resp. rate (!) 29, height _0  (1.575 m), weight 72.6 kg (160 lb), SpO2 94 %.  HEENT-  Normocephalic, no lesions, without obvious abnormality.  Normal external eye and conjunctiva.  Normal TM's bilaterally.  Normal auditory canals and external ears. Normal external nose, mucus membranes and septum.  Normal pharynx. Cardiovascular- S1, S2 normal, pulses palpable throughout   Lungs- chest clear, no wheezing, rales, normal symmetric air entry Abdomen- soft, non-tender; bowel sounds normal; no masses,  no organomegaly Extremities- no edema Lymph-no adenopathy palpable Musculoskeletal-no joint tenderness, deformity or swelling Skin-warm and dry, no hyperpigmentation, vitiligo, or suspicious lesions  Neurological Examination   Mental Status: Lethargic but arousable with light tactile stimuli.  Speech fluent without evidence of aphasia.  Able to follow 3 step commands without difficulty. Cranial Nerves: II: Discs flat bilaterally; Visual fields grossly normal, pupils equal, round, reactive to light and accommodation III,IV, VI: ptosis not present,  extra-ocular motions intact bilaterally V,VII: smile symmetric, facial light touch sensation normal bilaterally VIII: deaf IX,X: gag reflex present XI: bilateral shoulder shrug XII: midline tongue extension Motor: Right : Upper extremity   5/5    Left:     Upper extremity   5/5  Lower extremity   5/5     Lower extremity   5/5 Tone and bulk:normal tone throughout; no atrophy noted Sensory: Pinprick and light touch intact throughout, bilaterally Deep Tendon Reflexes: 2+ and symmetric throughout Plantars: Right: downgoing   Left: downgoing Cerebellar: normal finger-to-nose and normal heel-to-shin testing bilaterally Gait: not tested due to safety concerns    Laboratory Studies:   Basic Metabolic Panel:  Recent Labs Lab 03/24/17 1738 03/25/17 0301  NA 139 139  K 2.3* 3.1*  CL 96* 100*  CO2 31 29  GLUCOSE 163* 179*  BUN 8 6  CREATININE 0.42* 0.45  CALCIUM 9.8 8.6*  MG  --  1.4*  PHOS  --  3.0    Liver Function Tests:  Recent Labs Lab 03/25/17 0301  AST 23  ALT 11*  ALKPHOS 94  BILITOT 0.6  PROT 5.9*  ALBUMIN 3.0*   No results for input(s): LIPASE, AMYLASE in the last 168 hours. No results for input(s): AMMONIA in the last 168 hours.  CBC:  Recent Labs Lab 03/24/17 1738 03/25/17 0301  WBC 8.0 7.8  NEUTROABS 4.8  --   HGB 15.9 14.4  HCT 45.0 40.7  MCV 80.9 81.7  PLT 278 230    Cardiac Enzymes:  Recent Labs Lab 03/24/17 1738 03/25/17 0301 03/25/17 1012  TROPONINI 0.03* <0.03 <0.03    BNP: Invalid input(s): POCBNP  CBG:  Recent Labs Lab 03/25/17 0254 03/25/17 0742 03/25/17 1142  GLUCAP 174* 186* 192*    Microbiology: Results for orders placed or performed during the hospital encounter of 03/24/17  Blood culture (routine x 2)     Status: None (Preliminary result)   Collection Time: 03/24/17  7:10 PM  Result Value Ref Range Status   Specimen Description BLOOD BLOOD LEFT FOREARM BLOOD RIGHT FOREARM  Final   Special Requests   Final     BOTTLES DRAWN AEROBIC AND ANAEROBIC Blood Culture adequate volume   Culture NO GROWTH < 12 HOURS  Final   Report Status PENDING  Incomplete  Blood culture (routine x 2)     Status: None (Preliminary result)   Collection Time: 03/24/17  7:11 PM  Result Value Ref Range  Status   Specimen Description BLOOD BLOOD LEFT FOREARM  Final   Special Requests   Final    BOTTLES DRAWN AEROBIC AND ANAEROBIC Blood Culture adequate volume   Culture NO GROWTH < 12 HOURS  Final   Report Status PENDING  Incomplete  MRSA PCR Screening     Status: None   Collection Time: 03/25/17  2:44 AM  Result Value Ref Range Status   MRSA by PCR NEGATIVE NEGATIVE Final    Comment:        The GeneXpert MRSA Assay (FDA approved for NASAL specimens only), is one component of a comprehensive MRSA colonization surveillance program. It is not intended to diagnose MRSA infection nor to guide or monitor treatment for MRSA infections.     Coagulation Studies: No results for input(s): LABPROT, INR in the last 72 hours.  Urinalysis: No results for input(s): COLORURINE, LABSPEC, PHURINE, GLUCOSEU, HGBUR, BILIRUBINUR, KETONESUR, PROTEINUR, UROBILINOGEN, NITRITE, LEUKOCYTESUR in the last 168 hours.  Invalid input(s): APPERANCEUR  Lipid Panel:  No results found for: CHOL, TRIG, HDL, CHOLHDL, VLDL, LDLCALC  HgbA1C:  Lab Results  Component Value Date   HGBA1C 7.9 (H) 07/12/2016    Urine Drug Screen:  No results found for: LABOPIA, COCAINSCRNUR, LABBENZ, AMPHETMU, THCU, LABBARB  Alcohol Level: No results for input(s): ETH in the last 168 hours.  Other results: EKG: sinus tachycardia at 125 bpm.  Imaging: Dg Chest 2 View  Result Date: 03/24/2017 CLINICAL DATA:  Shortness of breath and cough EXAM: CHEST  2 VIEW COMPARISON:  02/11/2017 FINDINGS: Obscuration of the left diaphragm since the previous exam. No pleural effusion. Stable cardiomediastinal silhouette with aortic atherosclerosis. No pneumothorax.  Degenerative changes of the spine. Surgical clips in the right upper quadrant IMPRESSION: 1. Left lower lobe opacity may reflect atelectasis or a pneumonia. Radiographic follow-up to document resolution is recommended. Electronically Signed   By: Donavan Foil M.D.   On: 03/24/2017 17:57     Assessment/Plan: 66 year old female with a history of seizures presenting with breakthrough seizure after complaints of SOB.  Patient on Vimpat at 80m BID at home.  Reports compliance.  Loaded with 1027mof Vimpat.  Mag and Phos are unremarkable.    Recommendations: 1.  Increase maintenance Vimpat to 10066mID 2.  Continue seizure precautions.     LesAlexis GoodellD Neurology [id number], 12:31 PM

## 2017-03-25 NOTE — Progress Notes (Signed)
[place] at [employer] NAME: [name]    MR#:  [id number]  DATE OF BIRTH:  [birth year]  SUBJECTIVE:   Patient here with cough and shortness of breath.  Interpreter at bedside.  REVIEW OF SYSTEMS:    Review of Systems  Constitutional: Negative for fever, chills weight loss She is deaf HENT: Negative for ear pain, nosebleeds, congestion, facial swelling, rhinorrhea, neck pain, neck stiffness and ear discharge.   Respiratory: Positive cough and shortness of breath no wheezing Cardiovascular: Negative for chest pain, palpitations and leg swelling.  Gastrointestinal: Negative for heartburn, abdominal pain, vomiting, diarrhea or consitpation Genitourinary: Negative for dysuria, urgency, frequency, hematuria Musculoskeletal: Negative for back pain or joint pain Neurological: Negative for dizziness, seizures, syncope, focal weakness,  numbness and headaches.  Hematological: Does not bruise/bleed easily.  Psychiatric/Behavioral: Negative for hallucinations, confusion, dysphoric mood    Tolerating Diet: yes      DRUG ALLERGIES:   Allergies  Allergen Reactions  . Aspirin Itching  . Celebrex [Celecoxib] Itching    itching  . Ciprofloxacin Itching  . Codeine Itching  . Fosphenytoin Itching  . Levaquin [Levofloxacin In D5w] Itching  . Levofloxacin Itching  . Lovastatin Itching  . Penicillins     Documentation indicates severe reaction  Pt tolerated cephalosporin without adverse reaction 09/18   . Pravastatin Itching  . Sulfa Antibiotics Itching    VITALS:  Blood pressure (!) 186/88, pulse 63, temperature 97.6 F (36.4 C), temperature source Axillary, resp. rate (!) 29, height _0  (1.575 m), weight 72.6 kg (160 lb), SpO2 94 %.  PHYSICAL EXAMINATION:  Constitutional: Appears well-developed and well-nourished. No distress. HENT: Normocephalic. Marland Kitchen Oropharynx is clear and moist.  Eyes: Conjunctivae and EOM are normal. PERRLA,  no scleral icterus.  Neck: Normal ROM. Neck supple. No JVD. No tracheal deviation. CVS: RRR, S1/S2 +, no murmurs, no gallops, no carotid bruit.  Pulmonary: Respiratory effort is normal. She has crackles at the left base. No wheezing. Abdominal: Soft. BS +,  no distension, tenderness, rebound or guarding.  Musculoskeletal: Normal range of motion. No edema and no tenderness.  Neuro: Alert. CN 2-12 grossly intact. No focal deficits. Skin: Skin is warm and dry. No rash noted. Psychiatric: Normal mood and affect.      LABORATORY PANEL:   CBC  Recent Labs Lab 03/25/17 0301  WBC 7.8  HGB 14.4  HCT 40.7  PLT 230   ------------------------------------------------------------------------------------------------------------------  Chemistries   Recent Labs Lab 03/25/17 0301  NA 139  K 3.1*  CL 100*  CO2 29  GLUCOSE 179*  BUN 6  CREATININE 0.45  CALCIUM 8.6*  MG 1.4*  AST 23  ALT 11*  ALKPHOS 94  BILITOT 0.6   ------------------------------------------------------------------------------------------------------------------  Cardiac Enzymes  Recent Labs Lab 03/24/17 1738 03/25/17 0301 03/25/17 1012  TROPONINI 0.03* <0.03 <0.03   ------------------------------------------------------------------------------------------------------------------  RADIOLOGY:  Dg Chest 2 View  Result Date: 03/24/2017 CLINICAL DATA:  Shortness of breath and cough EXAM: CHEST  2 VIEW COMPARISON:  02/11/2017 FINDINGS: Obscuration of the left diaphragm since the previous exam. No pleural effusion. Stable cardiomediastinal silhouette with aortic atherosclerosis. No pneumothorax. Degenerative changes of the spine. Surgical clips in the right upper quadrant IMPRESSION: 1. Left lower lobe opacity may reflect atelectasis or a pneumonia. Radiographic follow-up to document resolution is recommended. Electronically Signed   By: Donavan Foil M.D.   On: 03/24/2017 17:57     ASSESSMENT AND PLAN:     66 year old female with  a history of deafness, chronic respiratory failure on 2 L of oxygen due to COPD and diabetes who presents with shortness of breath.  1. Acute on chronic hypoxic respiratory failure due to healthcare associated pneumonia: Continue aztreonam and vancomycin Follow up on MRSA, if negative d/c VANC Patient weaned to 2 L baseline oxygen  2. Seizure disorder: Patient had a seizure in the emergency room. She did not receive her regular seizure medications which is likely the reason she had a seizure. Continue outpatient medications including can pat Neurology consultation requested  3. Hypokalemia: This will be repleted and rechecked in a.m.  4. Essential hypertension: Continue losartan/HCTZ and diltiazem  5. Diabetes: Continue sliding scale and ADA diet Holding glipizide for now 6. Depression: She Celexa 7. Evident neuropathy: Continue gabapentin 8. Restless leg syndrome: Continue Requip   Physical therapy consultation for disposition planning Management plans discussed with the patient and she is in agreement.  CODE STATUS: full  TOTAL TIME TAKING CARE OF THIS PATIENT: 30 minutes.     POSSIBLE D/C tomorrow, DEPENDING ON CLINICAL CONDITION.   Anet Logsdon M.D on 03/25/2017 at 11:14 AM  Between 7am to 6pm - Pager - [phone] After 6pm go to [email or web address] - password [email or web address] [place] [facility]  Office  [phone]  CC: Primary care physician; Cletis Athens, MD  Note: This dictation was prepared with Dragon dictation along with smaller phrase technology. Any transcriptional errors that result from this process are unintentional.

## 2017-03-25 NOTE — Progress Notes (Signed)
MEDICATION RELATED CONSULT NOTE - INITIAL   Pharmacy Consult for electrolyte management  Indication: hypokalemia/hypomagnesemia   Allergies  Allergen Reactions  . Aspirin Itching  . Celebrex [Celecoxib] Itching    itching  . Ciprofloxacin Itching  . Codeine Itching  . Fosphenytoin Itching  . Levaquin [Levofloxacin In D5w] Itching  . Levofloxacin Itching  . Lovastatin Itching  . Penicillins     Documentation indicates severe reaction  Pt tolerated cephalosporin without adverse reaction 09/18   . Pravastatin Itching  . Sulfa Antibiotics Itching    Patient Measurements: Height: _0  (157.5 cm) Weight: 160 lb (72.6 kg) IBW/kg (Calculated) : 50.1  Vital Signs: Temp: 97.5 F (36.4 C) (09/19 1600) Temp Source: Axillary (09/19 1600) BP: 163/91 (09/19 1800) Pulse Rate: 61 (09/19 1800) Intake/Output from previous day: 09/18 0701 - 09/19 0700 In: 2941.7 [I.V.:2611.7; IV Piggyback:330] Out: 200 [Urine:200] Intake/Output from this shift: No intake/output data recorded.  Labs:  Recent Labs  03/24/17 1738 03/25/17 0301  WBC 8.0 7.8  HGB 15.9 14.4  HCT 45.0 40.7  PLT 278 230  CREATININE 0.42* 0.45  MG  --  1.4*  PHOS  --  3.0  ALBUMIN  --  3.0*  PROT  --  5.9*  AST  --  23  ALT  --  11*  ALKPHOS  --  94  BILITOT  --  0.6   Estimated Creatinine Clearance: 65.4 mL/min (by C-G formula based on SCr of 0.45 mg/dL).    Medical History: Past Medical History:  Diagnosis Date  . Asthma   . Cirrhosis, non-alcoholic ([place])   . COPD (chronic obstructive pulmonary disease) ([place])   . Deaf   . Depression   . Diabetes mellitus without complication ([place])   . GERD (gastroesophageal reflux disease)   . Heart murmur   . Hepatitis   . Hypertension   . Lymph node disorder    arm  . Neuropathy   . On home oxygen therapy    hs  . Orthopnea   . RLS (restless legs syndrome)   . Seizures ([place])   . Shortness of breath dyspnea   . Sleep apnea   . Stroke [facility])    [place] consulted for electrolyte management for 66 yo female with history significant for seizures. Patient had seizure in am on 9/19, lacosamide dose increased to 152m IV Q12hr. Patient ordered furosemide 453mPO Daily.   Plan:  Will order potassium 4021mQ4hr PO x 3 dose. Will order magnesium 4g IV x 1. Will recheck elctrolytes with am labs.   Pharmacy will continue to monitor and adjust per consult.   Makynleigh Breslin L 03/25/2017,9:39 PM

## 2017-03-25 NOTE — Progress Notes (Addendum)
Suffered seizure this AM. Now post ictal. No respiratory distress.  Vitals:   03/25/17 0205 03/25/17 0246 03/25/17 0400 03/25/17 0600  BP: (!) 169/97 (!) 188/76 (!) 129/110 (!) 186/88  Pulse: 61 (!) 56 (!) 58 63  Resp: (!) 33 (!) 32 (!) 31 (!) 29  Temp:  97.6 F (36.4 C)    TempSrc:  Axillary    SpO2: 97% 95% 93% 94%  Weight:      Height:        Gen: Somnolent, NAD HEENT: NCAT, sclerae white, oropharynx normal Neck: No LAN, no JVD noted Lungs: full BS, few scattered wheezes Cardiovascular: Reg, no M  Abdomen: Soft, NT, +BS Ext: no C/C/E Neuro: PERRL, EOMI, MAEs   BMP Latest Ref Rng & Units 03/25/2017 03/24/2017 02/12/2017  Glucose 65 - 99 mg/dL 179(H) 163(H) 262(H)  BUN 6 - 20 mg/dL _0 Creatinine 0.44 - 1.00 mg/dL 0.45 0.42(L) 0.76  Sodium 135 - 145 mmol/L 139 139 139  Potassium 3.5 - 5.1 mmol/L 3.1(L) 2.3(LL) 3.7  Chloride 101 - 111 mmol/L 100(L) 96(L) 97(L)  CO2 22 - 32 mmol/L _1 Calcium 8.9 - 10.3 mg/dL 8.6(L) 9.8 8.7(L)    CBC Latest Ref Rng & Units 03/25/2017 03/24/2017 02/12/2017  WBC 3.6 - 11.0 K/uL 7.8 8.0 10.7  Hemoglobin 12.0 - 16.0 g/dL 14.4 15.9 14.4  Hematocrit 35.0 - 47.0 % 40.7 45.0 42.6  Platelets 150 - 440 K/uL 230 278 301   PCT: < 0.10 CXR: LLL ATX vs infiltrate  IMPRESSION: Chronic O2 dependent COPD H/O OSA, CVA Seizure disorder Deafness Acute on chronic respiratory failure  I'm not impressed with CXR but could represent PNA  The acute seizures appear to be due to missed doses of her maintenance anti-convulsants  PLAN/REC: Vimpat ordered IV  Received 1 mg lorazepam under my direction this AM Cont current COPD Rx DC Vanc/aztreonam Ceftriaxone initiated 09/19 for COPD ex and possible PNA REcheck CXR in AM 09/20 Neuro to see  Merton Border, MD PCCM service Mobile [phone] Pager [phone] 03/25/2017 12:47 PM

## 2017-03-25 NOTE — Progress Notes (Signed)
PT Cancellation Note  Patient Details Name: [name] MRN: [id number] DOB: 1950-09-12   Cancelled Treatment:    Reason Eval/Treat Not Completed: Medical issues which prohibited therapy (BP 196/147).  RN notified.  This PT exited room to get pillow case for the pt and upon return pt was coughing and reaching for suction as if she was choking.  She was assisted into sitting upright and was able to clear throat but continued to hyperventilate.  RN notified who came to room and assessed pt.  It took pt several minutes for pt to catch her breath and normalize her breathing.     Collie Siad PT, DPT 03/25/2017, 9:45 AM

## 2017-03-25 NOTE — Consult Note (Signed)
Name: [name] MRN: [id number] DOB: [birth year]    ADMISSION DATE:  03/24/2017  CONSULTATION DATE:  03/24/17  REFERRING MD :  Dr. Ara Kussmaul  CHIEF COMPLAINT:  Shortness of Breath  BRIEF PATIENT DESCRIPTION: 66 year old female with  Acute on chronic Respiratory failure secondary to PNA   SIGNIFICANT EVENTS  9/18 Patient admitted to the SDU with Acute On Chronic respiratory failure with HCAP   STUDIES:  07/13/16 ECHO>>Wall thickness was increased in a pattern of mild LVH.   Systolic function was normal. The estimated ejectionfraction was in the range of 50% to 55%   HISTORY OF PRESENT ILLNESS:  [name] is a 66 year old female with known history of Asthma, Non alcoholic cirrhosis,COPD- 2l O2 at baseline,DM,GERD,seizures, sleep apnea and stroke.  Patient presents to ED on 9/18 with shortness of breath.   Upon arrival to ED she was found to be hypoxic and was placed on Bipap.  Patient's O2 saturation improved however patient started to have seizures.  Patient was given ativan and now with decreased responsiveness therefore the patient was sent to the SDU for further monitoring  PAST MEDICAL HISTORY :   has a past medical history of Asthma; Cirrhosis, non-alcoholic ([place]); COPD (chronic obstructive pulmonary disease) ([place]); Deaf; Depression; Diabetes mellitus without complication ([place]); GERD (gastroesophageal reflux disease); Heart murmur; Hepatitis; Hypertension; Lymph node disorder; Neuropathy; On home oxygen therapy; Orthopnea; RLS (restless legs syndrome); Seizures ([place]); Shortness of breath dyspnea; Sleep apnea; and Stroke ([place]).  has a past surgical history that includes Thumb arthroscopy; Tonsillectomy; Cesarean section; Cholecystectomy; Tympanoplasty; Knee Arthroplasty; Cataract extraction w/PHACO (Right, 11/23/2014); and Cataract extraction w/PHACO (Left, 12/14/2014). Prior to Admission medications   Medication Sig Start Date End Date Taking? Authorizing Provider  albuterol  (PROVENTIL HFA;VENTOLIN HFA) 108 (90 BASE) MCG/ACT inhaler Inhale 2 puffs into the lungs every 4 (four) hours as needed for wheezing or shortness of breath.   Yes [provider]  albuterol (PROVENTIL) (2.5 MG/3ML) 0.083% nebulizer solution Take 3 mLs (2.5 mg total) by nebulization every 4 (four) hours as needed for wheezing or shortness of breath. 07/17/16  Yes Vaughan Basta, MD  citalopram (CELEXA) 20 MG tablet Take 1 tablet (20 mg total) by mouth daily. Patient taking differently: Take 20 mg by mouth at bedtime.  07/17/16  Yes Vaughan Basta, MD  clopidogrel (PLAVIX) 75 MG tablet Take 1 tablet (75 mg total) by mouth daily. 07/17/16  Yes Vaughan Basta, MD  diltiazem (DILACOR XR) 180 MG 24 hr capsule Take 180 mg by mouth daily.   Yes [provider]  EPINEPHrine 0.3 mg/0.3 mL IJ SOAJ injection Inject into the muscle once.   Yes [provider]  furosemide (LASIX) 40 MG tablet Take 1 tablet (40 mg total) by mouth daily. 07/17/16  Yes Vaughan Basta, MD  gabapentin (NEURONTIN) 300 MG capsule Take 1 capsule by mouth 2 (two) times daily.  01/16/16  Yes [provider]  GLIPIZIDE XL 5 MG 24 hr tablet Take 2 tablets (10 mg total) by mouth daily. Patient taking differently: Take 5 mg by mouth daily.  02/12/17  Yes Max Sane, MD  lacosamide (VIMPAT) 50 MG TABS tablet Take 1 tablet (50 mg total) by mouth 2 (two) times daily. 07/17/16  Yes Vaughan Basta, MD  losartan-hydrochlorothiazide (HYZAAR) 100-12.5 MG tablet Take 1 tablet by mouth daily.   Yes [provider]  mometasone-formoterol (DULERA) 200-5 MCG/ACT AERO Inhale 2 puffs into the lungs 2 (two) times daily. 07/17/16  Yes  Vaughan Basta, MD  montelukast (SINGULAIR) 10 MG tablet Take 1 tablet by mouth at bedtime. 03/18/16  Yes [provider]  omeprazole (PRILOSEC) 20 MG capsule Take 20 mg by mouth daily.   Yes [provider]  rOPINIRole (REQUIP)  0.25 MG tablet Take 1 tablet (0.25 mg total) by mouth 3 (three) times daily. 03/24/16  Yes Oswald Hillock, MD   Allergies  Allergen Reactions  . Aspirin Itching  . Celebrex [Celecoxib] Itching    itching  . Ciprofloxacin Itching  . Codeine Itching  . Fosphenytoin Itching  . Levaquin [Levofloxacin In D5w] Itching  . Levofloxacin Itching  . Lovastatin Itching  . Penicillins     Has patient had a PCN reaction causing immediate rash, facial/tongue/throat swelling, SOB or lightheadedness with hypotension: Yes Has patient had a PCN reaction causing severe rash involving mucus membranes or skin necrosis: Yes Has patient had a PCN reaction that required hospitalization: NO Has patient had a PCN reaction occurring within the last 10 years: NO If all of the above answers are "NO", then may proceed with Cephalosporin use.   . Pravastatin Itching  . Sulfa Antibiotics Itching    FAMILY HISTORY:  family history includes CAD in her father; Lung cancer in her mother. SOCIAL HISTORY:  reports that she has been smoking.  She has been smoking about 0.50 packs per day. She has never used smokeless tobacco. She reports that she does not drink alcohol or use drugs.  REVIEW OF SYSTEMS:   Constitutional: Negative for fever, chills, weight loss, malaise/fatigue and diaphoresis.  HENT: Negative for hearing loss, ear pain, nosebleeds, congestion, sore throat, neck pain, tinnitus and ear discharge.   Eyes: Negative for blurred vision, double vision, photophobia, pain, discharge and redness.  Respiratory: Negative for cough, hemoptysis, sputum production, shortness of breath, wheezing and stridor.   Cardiovascular: Negative for chest pain, palpitations, orthopnea, claudication, leg swelling and PND.  Gastrointestinal: Negative for heartburn, nausea, vomiting, abdominal pain, diarrhea, constipation, blood in stool and melena.  Genitourinary: Negative for dysuria, urgency, frequency, hematuria and flank pain.    Musculoskeletal: Negative for myalgias, back pain, joint pain and falls.  Skin: Negative for itching and rash.  Neurological: Negative for dizziness, tingling, tremors, sensory change, speech change, focal weakness, seizures, loss of consciousness, weakness and headaches.  Endo/Heme/Allergies: Negative for environmental allergies and polydipsia. Does not bruise/bleed easily.  SUBJECTIVE: Patient is Deaf and with decreased responsiveness due to [place]: Temp:  [97.6 F (36.4 C)] 97.6 F (36.4 C) (09/18 1712) Pulse Rate:  [57-139] 57 (09/19 0030) Resp:  [22-36] 24 (09/19 0030) BP: (150-184)/(59-104) 170/81 (09/19 0030) SpO2:  [85 %-97 %] 96 % (09/19 0030) Weight:  [72.6 kg (160 lb)] 72.6 kg (160 lb) (09/18 1716)  PHYSICAL EXAMINATION: General:  66 year old female , in no acute distress Neuro:  awake HEENT: AT,[place],No jvd Cardiovascular:  S1S2,regular,no m/r/g noted Lungs:  Rhonchi throughout, no wheezes or rales noted Abdomen:  Soft,NT,ND Musculoskeletal:  No edema,cyanosis Skin:  Warm,dry and Intact   Recent Labs Lab 03/24/17 1738  NA 139  K 2.3*  CL 96*  CO2 31  BUN 8  CREATININE 0.42*  GLUCOSE 163*    Recent Labs Lab 03/24/17 1738  HGB 15.9  HCT 45.0  WBC 8.0  PLT 278   Dg Chest 2 View  Result Date: 03/24/2017 CLINICAL DATA:  Shortness of breath and cough EXAM: CHEST  2 VIEW COMPARISON:  02/11/2017 FINDINGS: Obscuration of the left diaphragm  since the previous exam. No pleural effusion. Stable cardiomediastinal silhouette with aortic atherosclerosis. No pneumothorax. Degenerative changes of the spine. Surgical clips in the right upper quadrant IMPRESSION: 1. Left lower lobe opacity may reflect atelectasis or a pneumonia. Radiographic follow-up to document resolution is recommended. Electronically Signed   By: Donavan Foil M.D.   On: 03/24/2017 17:57    ASSESSMENT / PLAN:  Acute on Chronic respiratory failure secondary to  pneumonia COPD Seizures Hypokalemia Hx of Hypertension Hx of Depression/Restless leg syndrome Hx of GERD Hx of DM  Plan Support with O2 to keep sats >92% Continue Azactam/Vancomycin I/V Fluids for hydration Continue Dulera, singulair Follow Cultures Monitor fever,cbc Check Mg Follow BMP Replace electrolytes  Ativan PRN Neuro checks routine Seizure and aspiration precaution Continue diltiazem/Hyzaar,Lasix Continue celexa/Requip Continue Protonix Accu checks with SSI coverage    Bincy Varughese,AG-ACNP Pulmonary and [place]   03/25/2017, 1:43 AM  Merton Border, MD PCCM service Mobile [phone] Pager [phone] 03/25/2017 12:35 PM

## 2017-03-25 NOTE — Progress Notes (Signed)
Pharmacy Antibiotic Note  [name] is a 66 y.o. female admitted on 03/24/2017 with pneumonia.  Pharmacy has been consulted for vancomycin dosing.  Plan: DW 59 kg  Vd 41L kei 0.058 hr-1  T1/2 12 hours Vancomycin 1 gram q 18 hours ordered with stacked dosing. Level before 5th dose. Goal trough 15-20.  Height: _0  (157.5 cm) Weight: 160 lb (72.6 kg) IBW/kg (Calculated) : 50.1  Temp (24hrs), Avg:97.6 F (36.4 C), Min:97.6 F (36.4 C), Max:97.6 F (36.4 C)   Recent Labs Lab 03/24/17 1738 03/24/17 1853 03/25/17 0301  WBC 8.0  --  7.8  CREATININE 0.42*  --  0.45  LATICACIDVEN  --  1.1 1.8    Estimated Creatinine Clearance: 65.4 mL/min (by C-G formula based on SCr of 0.45 mg/dL).    Allergies  Allergen Reactions  . Aspirin Itching  . Celebrex [Celecoxib] Itching    itching  . Ciprofloxacin Itching  . Codeine Itching  . Fosphenytoin Itching  . Levaquin [Levofloxacin In D5w] Itching  . Levofloxacin Itching  . Lovastatin Itching  . Penicillins     Has patient had a PCN reaction causing immediate rash, facial/tongue/throat swelling, SOB or lightheadedness with hypotension: Yes Has patient had a PCN reaction causing severe rash involving mucus membranes or skin necrosis: Yes Has patient had a PCN reaction that required hospitalization: NO Has patient had a PCN reaction occurring within the last 10 years: NO If all of the above answers are "NO", then may proceed with Cephalosporin use.   . Pravastatin Itching  . Sulfa Antibiotics Itching    Antimicrobials this admission: Aztreonam, vancomycin  >>    >>   Dose adjustments this admission:   Microbiology results: 9/18 BCx: pending  9/19 Sputum: pending  9/19 MRSA PCR: pending     Thank you for allowing pharmacy to be a part of this patient's care.  Siri Buege S 03/25/2017 4:14 AM

## 2017-03-26 ENCOUNTER — Inpatient Hospital Stay: Payer: Medicare HMO

## 2017-03-26 DIAGNOSIS — J449 Chronic obstructive pulmonary disease, unspecified: Secondary | ICD-10-CM

## 2017-03-26 DIAGNOSIS — J9819 Other pulmonary collapse: Secondary | ICD-10-CM

## 2017-03-26 DIAGNOSIS — J9601 Acute respiratory failure with hypoxia: Secondary | ICD-10-CM

## 2017-03-26 LAB — CBC
HEMATOCRIT: 41.9 % (ref 35.0–47.0)
Hemoglobin: 14.5 g/dL (ref 12.0–16.0)
MCH: 28.9 pg (ref 26.0–34.0)
MCHC: 34.6 g/dL (ref 32.0–36.0)
MCV: 83.7 fL (ref 80.0–100.0)
Platelets: 230 10*3/uL (ref 150–440)
RBC: 5 MIL/uL (ref 3.80–5.20)
RDW: 13.9 % (ref 11.5–14.5)
WBC: 11.8 10*3/uL — ABNORMAL HIGH (ref 3.6–11.0)

## 2017-03-26 LAB — LEGIONELLA PNEUMOPHILA SEROGP 1 UR AG: L. PNEUMOPHILA SEROGP 1 UR AG: NEGATIVE

## 2017-03-26 LAB — BASIC METABOLIC PANEL
Anion gap: 6 (ref 5–15)
BUN: 6 mg/dL (ref 6–20)
CALCIUM: 8.7 mg/dL — AB (ref 8.9–10.3)
CO2: 28 mmol/L (ref 22–32)
Chloride: 107 mmol/L (ref 101–111)
Creatinine, Ser: 0.42 mg/dL — ABNORMAL LOW (ref 0.44–1.00)
GFR calc non Af Amer: >60 mL/min (ref >60)
Glucose, Bld: 141 mg/dL — ABNORMAL HIGH (ref 65–99)
Potassium: 5.2 mmol/L — ABNORMAL HIGH (ref 3.5–5.1)
SODIUM: 141 mmol/L (ref 135–145)

## 2017-03-26 LAB — PHOSPHORUS: PHOSPHORUS: 3.1 mg/dL (ref 2.5–4.6)

## 2017-03-26 LAB — MAGNESIUM: Magnesium: 2 mg/dL (ref 1.7–2.4)

## 2017-03-26 LAB — GLUCOSE, CAPILLARY
GLUCOSE-CAPILLARY: 239 mg/dL — AB (ref 65–99)
Glucose-Capillary: 144 mg/dL — ABNORMAL HIGH (ref 65–99)
Glucose-Capillary: 129 mg/dL — ABNORMAL HIGH (ref 65–99)
Glucose-Capillary: 163 mg/dL — ABNORMAL HIGH (ref 65–99)

## 2017-03-26 LAB — PROCALCITONIN

## 2017-03-26 LAB — HIV ANTIBODY (ROUTINE TESTING W REFLEX): HIV SCREEN 4TH GENERATION: NONREACTIVE

## 2017-03-26 MED ORDER — LACOSAMIDE 50 MG PO TABS
150.0000 mg | ORAL_TABLET | Freq: Two times a day (BID) | ORAL | Status: DC
  Administered 2017-03-26 – 2017-03-30 (×8): 150 mg via ORAL
  Filled 2017-03-26 (×8): qty 3

## 2017-03-26 MED ORDER — LACOSAMIDE 50 MG PO TABS: 100.0000 mg | ORAL_TABLET | Freq: Two times a day (BID) | ORAL | Status: DC

## 2017-03-26 MED ORDER — BUDESONIDE 0.25 MG/2ML IN SUSP
0.2500 mg | Freq: Four times a day (QID) | RESPIRATORY_TRACT | Status: DC
  Administered 2017-03-26 – 2017-03-30 (×14): 0.25 mg via RESPIRATORY_TRACT
  Filled 2017-03-26 (×15): qty 2

## 2017-03-26 MED ORDER — OXYCODONE HCL 5 MG PO TABS
2.5000 mg | ORAL_TABLET | Freq: Once | ORAL | Status: AC
  Administered 2017-03-26: 2.5 mg via ORAL
  Filled 2017-03-26: qty 1

## 2017-03-26 MED ORDER — IPRATROPIUM-ALBUTEROL 0.5-2.5 (3) MG/3ML IN SOLN
3.0000 mL | Freq: Four times a day (QID) | RESPIRATORY_TRACT | Status: DC
  Administered 2017-03-26 – 2017-03-30 (×14): 3 mL via RESPIRATORY_TRACT
  Filled 2017-03-26 (×16): qty 3

## 2017-03-26 MED ORDER — ALBUTEROL SULFATE (2.5 MG/3ML) 0.083% IN NEBU: 2.5000 mg | INHALATION_SOLUTION | RESPIRATORY_TRACT | Status: DC | PRN

## 2017-03-26 MED ORDER — LORAZEPAM 2 MG/ML IJ SOLN
INTRAMUSCULAR | Status: AC
  Administered 2017-03-26: 15:00:00
  Filled 2017-03-26: qty 1

## 2017-03-26 MED ORDER — LACOSAMIDE 200 MG/20ML IV SOLN
200.0000 mg | Freq: Once | INTRAVENOUS | Status: AC
  Administered 2017-03-26: 200 mg via INTRAVENOUS
  Filled 2017-03-26: qty 20

## 2017-03-26 NOTE — Progress Notes (Signed)
  PT Cancellation Note  Patient Details Name: [name] MRN: [id number] DOB: Sep 05, 1950   Cancelled Treatment:    Reason Eval/Treat Not Completed: Other (comment). Discussed case with RN, pt just with another seizure, recommends to hold therapy evaluation at this time. Will re-attempt next available date.   Rhea Kaelin 03/26/2017, 2:38 PM Greggory Stallion, PT, DPT [phone]

## 2017-03-26 NOTE — Progress Notes (Signed)
No seizure activity overnight no new complaints.  Vitals:   03/26/17 0600 03/26/17 0700 03/26/17 0749 03/26/17 0954  BP: (!) 126/53 (!) 131/57 (!) 131/58 (!) 144/55  Pulse: 69 68    Resp: (!) 24 (!) 34    Temp:   98.4 F (36.9 C)   TempSrc:   Oral   SpO2: (!) 89% 90%    Weight:      Height:       PHYSICAL EXAMINATION  Gen: alert and awake, NAD HEENT: NCAT, sclerae white, oropharynx normal Neck: No LAN, no JVD noted Lungs: diminished throughout faint exp. wheezes, even, non labored on venturi mask  Cardiovascular: Reg, no M  Abdomen: Soft, NT, +BS Ext: no C/C/E Neuro: PERRL, EOMI, MAEs   BMP Latest Ref Rng & Units 03/26/2017 03/25/2017 03/24/2017  Glucose 65 - 99 mg/dL 141(H) 179(H) 163(H)  BUN 6 - 20 mg/dL _0 Creatinine 0.44 - 1.00 mg/dL 0.42(L) 0.45 0.42(L)  Sodium 135 - 145 mmol/L 141 139 139  Potassium 3.5 - 5.1 mmol/L 5.2(H) 3.1(L) 2.3(LL)  Chloride 101 - 111 mmol/L 107 100(L) 96(L)  CO2 22 - 32 mmol/L _1 Calcium 8.9 - 10.3 mg/dL 8.7(L) 8.6(L) 9.8    CBC Latest Ref Rng & Units 03/26/2017 03/25/2017 03/24/2017  WBC 3.6 - 11.0 K/uL 11.8(H) 7.8 8.0  Hemoglobin 12.0 - 16.0 g/dL 14.5 14.4 15.9  Hematocrit 35.0 - 47.0 % 41.9 40.7 45.0  Platelets 150 - 440 K/uL 230 230 278   PCT: < 0.10 CXR: LLL ATX vs infiltrate  IMPRESSION: Chronic O2 dependent COPD H/O OSA, CVA Seizure disorder Deafness Acute on chronic respiratory failure secondary to AECOPD and possible aspiration pneumonia and left upper lobe lingula collapse   The acute seizures appear to be due to missed doses of her maintenance anti-convulsants  PLAN/REC: Supplemental O2 to maintain O2 sats 88% to 92% Cont current COPD Rx Ceftriaxone initiated 09/19 for AECOPD and possible aspiration PNA Repeat CXR in am  Trend WBC and monitor fever curve Trend PCT  Continuous telemetry monitoring  Prn EKG  Neuro consulted appreciate input-continue vimpat per recommendations  Seizure precautions PT consulted    -Will keep in Stepdown Unit overnight  Marda Stalker, [place] Pager [phone] (please enter 7 digits) PCCM Consult Pager [phone] (please enter 7 digits)  PCCM ATTENDING ATTESTATION:  I have evaluated patient with the APP [place], reviewed database in its entirety and discussed care plan in detail. In addition, this patient was discussed on multidisciplinary rounds.   Important exam findings: No overt distress Requiring Venturi mask at 55% FiO2 HEENT WNL Diminished breath sounds on left with left basilar crackles No wheezes Regular, no M NABS Extremities warm, no edema  CXR: Volume loss and infiltrate on left appearance of segmental collapse (likely lingula)  ADD: She suffered a seizure this afternoon witnessed by me which resolved with 2 mg lorazepam  Major problems addressed by PCCM team: History of Seizure disorder and COPD Presented to ED with increased dyspnea Witnessed seizure in ED. Therefore admitted to ICU/SDU Smoker Probable left sided pneumonia Witnessed seizure 09/20 afternoon Increasing oxygen requirements  PLAN/REC: Continue to monitor in ICU/SDU Continue supplemental oxygen Continue ceftriaxone Follow-up culture data Follow chest x-ray Neurology following and we will adjust maintenance anticonvulsant regimen Nebulized steroids and bronchodilators ordered 09/20    Merton Border, MD PCCM service Mobile [phone] Pager [phone] 03/26/2017 2:55 PM

## 2017-03-26 NOTE — Progress Notes (Signed)
Pt transferred to to bed from [facility] and pts Oxygen level dropped to 75% and pt states feeling chest pain. Dr.Simmonds notified of patients persistent hypoxia. Dr.Simmonds to bedside. Pt placed on non-rebreather mask.  Pt began to have seizure like activity at 1412. Dr.Simmonds at bedside. 84m Ativan ordered and given. Pt's seizure like activity stopped at 1417.   Dr. RDoy Mincecurrently at bedside.  Will continue to assess.

## 2017-03-26 NOTE — Progress Notes (Signed)
[name] at [facility] NAME: [name]    MR#:  [id number]  DATE OF BIRTH:  [birth year]  SUBJECTIVE:   Patient on venturi mask this am   REVIEW OF SYSTEMS:    Review of Systems  Constitutional: Negative for fever, chills weight loss She is deaf HENT: Negative for ear pain, nosebleeds, congestion, facial swelling, rhinorrhea, neck pain, neck stiffness and ear discharge.   Respiratory: Positive cough and shortness of breath no wheezing Cardiovascular: Negative for chest pain, palpitations and leg swelling.  Gastrointestinal: Negative for heartburn, abdominal pain, vomiting, diarrhea or consitpation Genitourinary: Negative for dysuria, urgency, frequency, hematuria Musculoskeletal: Negative for back pain or joint pain Neurological: Negative for dizziness, seizures, syncope, focal weakness,  numbness and headaches.  Hematological: Does not bruise/bleed easily.  Psychiatric/Behavioral: Negative for hallucinations, confusion, dysphoric mood    Tolerating Diet: yes      DRUG ALLERGIES:   Allergies  Allergen Reactions  . Aspirin Itching  . Celebrex [Celecoxib] Itching    itching  . Ciprofloxacin Itching  . Codeine Itching  . Fosphenytoin Itching  . Levaquin [Levofloxacin In D5w] Itching  . Levofloxacin Itching  . Lovastatin Itching  . Penicillins     Documentation indicates severe reaction  Pt tolerated cephalosporin without adverse reaction 09/18   . Pravastatin Itching  . Sulfa Antibiotics Itching    VITALS:  Blood pressure 134/71, pulse 69, temperature 98.4 F (36.9 C), temperature source Oral, resp. rate (!) 27, height _0  (1.575 m), weight 72.6 kg (160 lb), SpO2 92 %.  PHYSICAL EXAMINATION:  Constitutional: Appears well-developed and well-nourished. No distress. HENT: Normocephalic. Marland Kitchen Oropharynx is clear and moist.  Eyes: Conjunctivae and EOM are normal. PERRLA, no scleral icterus.  Neck: Normal ROM. Neck  supple. No JVD. No tracheal deviation. CVS: RRR, S1/S2 +, no murmurs, no gallops, no carotid bruit.  Pulmonary: Respiratory effort is normal. She has crackles at the left base. No wheezing. Abdominal: Soft. BS +,  no distension, tenderness, rebound or guarding.  Musculoskeletal: Normal range of motion. No edema and no tenderness.  Neuro: Alert. CN 2-12 grossly intact. No focal deficits. Skin: Skin is warm and dry. No rash noted. Psychiatric: Normal mood and affect.      LABORATORY PANEL:   CBC  Recent Labs Lab 03/26/17 0523  WBC 11.8*  HGB 14.5  HCT 41.9  PLT 230   ------------------------------------------------------------------------------------------------------------------  Chemistries   Recent Labs Lab 03/25/17 0301 03/26/17 0523  NA 139 141  K 3.1* 5.2*  CL 100* 107  CO2 29 28  GLUCOSE 179* 141*  BUN 6 6  CREATININE 0.45 0.42*  CALCIUM 8.6* 8.7*  MG 1.4* 2.0  AST 23  --   ALT 11*  --   ALKPHOS 94  --   BILITOT 0.6  --    ------------------------------------------------------------------------------------------------------------------  Cardiac Enzymes  Recent Labs Lab 03/24/17 1738 03/25/17 0301 03/25/17 1012  TROPONINI 0.03* <0.03 <0.03   ------------------------------------------------------------------------------------------------------------------  RADIOLOGY:  Dg Chest 2 View  Result Date: 03/24/2017 CLINICAL DATA:  Shortness of breath and cough EXAM: CHEST  2 VIEW COMPARISON:  02/11/2017 FINDINGS: Obscuration of the left diaphragm since the previous exam. No pleural effusion. Stable cardiomediastinal silhouette with aortic atherosclerosis. No pneumothorax. Degenerative changes of the spine. Surgical clips in the right upper quadrant IMPRESSION: 1. Left lower lobe opacity may reflect atelectasis or a pneumonia. Radiographic follow-up to document resolution is recommended. Electronically Signed   By: Madie Reno.D.  On: 03/24/2017 17:57    Dg Chest Port 1 View  Result Date: 03/26/2017 CLINICAL DATA:  Respiratory failure. EXAM: PORTABLE CHEST 1 VIEW COMPARISON:  Chest x-ray dated March 24, 2017. FINDINGS: Increasing density within the lingula and left lower lobe, obscuring the left heart border and left diaphragm, respectively. Probable small left pleural effusion. The right lung is clear. No pneumothorax. The trachea remains midline. IMPRESSION: 1. Worsening consolidation within the lingula and left lower lobe, suspicious for multifocal pneumonia. Probable small left pleural effusion. Follow up to resolution is recommended. Consider chest CT for further evaluation as clinically indicated. Electronically Signed   By: Titus Dubin M.D.   On: 03/26/2017 11:04     ASSESSMENT AND PLAN:    66 year old female with a history of deafness, chronic respiratory failure on 2 L of oxygen due to COPD and diabetes who presents with shortness of breath.  1. Acute on chronic hypoxic respiratory failure due to healthcare associated pneumonia: Continue Ceftriaxone as per intensivist consultation.  Wean to baseline oxygen  2. Seizure disorder: Patient had a seizure in the emergency room. She did not receive her regular seizure medications which is the reason she had a seizure. Neurology consultation is appreciated. Recommendations are to increase maintenance dose of Vimpat 100 mg BID  3. Hyperkalemia: Would treat with insulin and dextrose.  4. Essential hypertension: Continue losartan/HCTZ and diltiazem  5. Diabetes: Continue sliding scale and ADA diet Holding glipizide for now 6. Depression: Continue  Celexa 7. Evident neuropathy: Continue gabapentin 8. Restless leg syndrome: Continue Requip   Physical therapy consultation for disposition planning Management plans discussed with the patient and she is in agreement.  CODE STATUS: full  TOTAL TIME TAKING CARE OF THIS PATIENT: 24 minutes.     POSSIBLE D/C 1-3 days DEPENDING  ON CLINICAL CONDITION.   Laini Urick M.D on 03/26/2017 at 11:31 AM  Between 7am to 6pm - Pager - [phone] After 6pm go to [email or web address] - password [email or web address] [facility]  Office  [phone]  CC: Primary care physician; Cletis Athens, MD  Note: This dictation was prepared with Dragon dictation along with smaller phrase technology. Any transcriptional errors that result from this process are unintentional.

## 2017-03-26 NOTE — Progress Notes (Signed)
Subjective: Patient became hypoxic again today and was noted to have another seizure.  Objective: Current vital signs: BP (!) 115/56 (BP Location: Right Arm)   Pulse 74   Temp 98.1 F (36.7 C) (Oral)   Resp (!) 36   Ht _0  (1.575 m)   Wt 72.6 kg (160 lb)   SpO2 91%   BMI 29.26 kg/m  Vital signs in last 24 hours: Temp:  [97.7 F (36.5 C)-98.4 F (36.9 C)] 98.1 F (36.7 C) (09/20 1700) Pulse Rate:  [61-82] 74 (09/20 1700) Resp:  [24-36] 36 (09/20 1700) BP: (86-156)/(53-102) 115/56 (09/20 1700) SpO2:  [80 %-98 %] 91 % (09/20 1700) FiO2 (%):  [50 %-55 %] 50 % (09/20 1404)  Intake/Output from previous day: 09/19 0701 - 09/20 0700 In: 120 [P.O.:120] Out: 2200 [Urine:2200] Intake/Output this shift: No intake/output data recorded. Nutritional status: Diet Carb Modified Fluid consistency: Thin; Room service appropriate? Yes  Neurologic Exam: Mental Status: Lethargic but arousable with light tactile stimuli.   Able to follow commands without difficulty. Cranial Nerves: II: Discs flat bilaterally; Visual fields grossly normal, pupils equal, round, reactive to light and accommodation III,IV, VI: ptosis not present, extra-ocular motions intact bilaterally V,VII: smile symmetric, facial light touch sensation normal bilaterally VIII: deaf IX,X: gag reflex present XI: bilateral shoulder shrug XII: midline tongue extension Motor: Moves all extremities against gravity.  No focal weakness noted.   Sensory: Pinprick and light touch intact throughout, bilaterally   Lab Results: Basic Metabolic Panel:  Recent Labs Lab 03/24/17 1738 03/25/17 0301 03/26/17 0523  NA 139 139 141  K 2.3* 3.1* 5.2*  CL 96* 100* 107  CO2 _1 GLUCOSE 163* 179* 141*  BUN _2 CREATININE 0.42* 0.45 0.42*  CALCIUM 9.8 8.6* 8.7*  MG  --  1.4* 2.0  PHOS  --  3.0 3.1    Liver Function Tests:  Recent Labs Lab 03/25/17 0301  AST 23  ALT 11*  ALKPHOS 94  BILITOT 0.6  PROT 5.9*   ALBUMIN 3.0*   No results for input(s): LIPASE, AMYLASE in the last 168 hours. No results for input(s): AMMONIA in the last 168 hours.  CBC:  Recent Labs Lab 03/24/17 1738 03/25/17 0301 03/26/17 0523  WBC 8.0 7.8 11.8*  NEUTROABS 4.8  --   --   HGB 15.9 14.4 14.5  HCT 45.0 40.7 41.9  MCV 80.9 81.7 83.7  PLT 278 230 230    Cardiac Enzymes:  Recent Labs Lab 03/24/17 1738 03/25/17 0301 03/25/17 1012  TROPONINI 0.03* <0.03 <0.03    Lipid Panel: No results for input(s): CHOL, TRIG, HDL, CHOLHDL, VLDL, LDLCALC in the last 168 hours.  CBG:  Recent Labs Lab 03/25/17 1625 03/25/17 2217 03/26/17 0715 03/26/17 1144 03/26/17 1704  GLUCAP 237* 113* 144* 129* 163*    Microbiology: Results for orders placed or performed during the hospital encounter of 03/24/17  Blood culture (routine x 2)     Status: None (Preliminary result)   Collection Time: 03/24/17  7:10 PM  Result Value Ref Range Status   Specimen Description BLOOD BLOOD LEFT FOREARM BLOOD RIGHT FOREARM  Final   Special Requests   Final    BOTTLES DRAWN AEROBIC AND ANAEROBIC Blood Culture adequate volume   Culture NO GROWTH 2 DAYS  Final   Report Status PENDING  Incomplete  Blood culture (routine x 2)     Status: None (Preliminary result)   Collection Time: 03/24/17  7:11 PM  Result  Value Ref Range Status   Specimen Description BLOOD BLOOD LEFT FOREARM  Final   Special Requests   Final    BOTTLES DRAWN AEROBIC AND ANAEROBIC Blood Culture adequate volume   Culture NO GROWTH 2 DAYS  Final   Report Status PENDING  Incomplete  MRSA PCR Screening     Status: None   Collection Time: 03/25/17  2:44 AM  Result Value Ref Range Status   MRSA by PCR NEGATIVE NEGATIVE Final    Comment:        The GeneXpert MRSA Assay (FDA approved for NASAL specimens only), is one component of a comprehensive MRSA colonization surveillance program. It is not intended to diagnose MRSA infection nor to guide or monitor treatment  for MRSA infections.     Coagulation Studies: No results for input(s): LABPROT, INR in the last 72 hours.  Imaging: Dg Chest Port 1 View  Result Date: 03/26/2017 CLINICAL DATA:  Respiratory failure. EXAM: PORTABLE CHEST 1 VIEW COMPARISON:  Chest x-ray dated March 24, 2017. FINDINGS: Increasing density within the lingula and left lower lobe, obscuring the left heart border and left diaphragm, respectively. Probable small left pleural effusion. The right lung is clear. No pneumothorax. The trachea remains midline. IMPRESSION: 1. Worsening consolidation within the lingula and left lower lobe, suspicious for multifocal pneumonia. Probable small left pleural effusion. Follow up to resolution is recommended. Consider chest CT for further evaluation as clinically indicated. Electronically Signed   By: Titus Dubin M.D.   On: 03/26/2017 11:04    Medications:  I have reviewed the patient's current medications. Scheduled: . budesonide (PULMICORT) nebulizer solution  0.25 mg Nebulization Q6H  . citalopram  20 mg Oral QHS  . clopidogrel  75 mg Oral Daily  . diltiazem  180 mg Oral Daily  . enoxaparin (LOVENOX) injection  40 mg Subcutaneous Q24H  . furosemide  40 mg Oral Daily  . gabapentin  300 mg Oral BID  . losartan  100 mg Oral Daily   And  . hydrochlorothiazide  12.5 mg Oral Daily  . insulin aspart  0-15 Units Subcutaneous TID WC  . insulin aspart  0-5 Units Subcutaneous QHS  . ipratropium-albuterol  3 mL Nebulization Q6H  . lacosamide  150 mg Oral BID  . pantoprazole  40 mg Oral Daily  . rOPINIRole  0.25 mg Oral TID  . sodium chloride flush  3 mL Intravenous Q12H    Assessment/Plan: Patient with breakthrough seizures on Vimpat at 122m BID.  Recommendations: 1.  Vimpat 2061mIV now 2.  Increase maintenance Vimpat to 15082mID.  May give IV if not alert enough to take po 3.  Continue seizure precautions 4.  Agree with continued efforts to prevent hypoxia   LOS: 2 days    LesAlexis GoodellD Neurology [phone]/2018  7:23 PM

## 2017-03-27 LAB — BASIC METABOLIC PANEL
ANION GAP: 10 (ref 5–15)
BUN: 9 mg/dL (ref 6–20)
CALCIUM: 8.5 mg/dL — AB (ref 8.9–10.3)
CO2: 31 mmol/L (ref 22–32)
Chloride: 98 mmol/L — ABNORMAL LOW (ref 101–111)
Creatinine, Ser: 0.47 mg/dL (ref 0.44–1.00)
Glucose, Bld: 155 mg/dL — ABNORMAL HIGH (ref 65–99)
POTASSIUM: 3.4 mmol/L — AB (ref 3.5–5.1)
Sodium: 139 mmol/L (ref 135–145)

## 2017-03-27 LAB — GLUCOSE, CAPILLARY
GLUCOSE-CAPILLARY: 196 mg/dL — AB (ref 65–99)
Glucose-Capillary: 173 mg/dL — ABNORMAL HIGH (ref 65–99)
Glucose-Capillary: 175 mg/dL — ABNORMAL HIGH (ref 65–99)
Glucose-Capillary: 168 mg/dL — ABNORMAL HIGH (ref 65–99)

## 2017-03-27 LAB — MAGNESIUM: Magnesium: 1.6 mg/dL — ABNORMAL LOW (ref 1.7–2.4)

## 2017-03-27 MED ORDER — RISAQUAD PO CAPS
1.0000 | ORAL_CAPSULE | Freq: Two times a day (BID) | ORAL | Status: DC
  Administered 2017-03-27 – 2017-03-30 (×7): 1 via ORAL
  Filled 2017-03-27 (×7): qty 1

## 2017-03-27 MED ORDER — POTASSIUM CHLORIDE 20 MEQ PO PACK
40.0000 meq | PACK | Freq: Once | ORAL | Status: AC
  Administered 2017-03-27: 40 meq via ORAL
  Filled 2017-03-27: qty 2

## 2017-03-27 MED ORDER — MAGNESIUM SULFATE 4 GM/100ML IV SOLN
4.0000 g | Freq: Once | INTRAVENOUS | Status: AC
  Administered 2017-03-27: 4 g via INTRAVENOUS
  Filled 2017-03-27: qty 100

## 2017-03-27 MED ORDER — FAMOTIDINE 40 MG/5ML PO SUSR
20.0000 mg | Freq: Every day | ORAL | Status: DC
  Administered 2017-03-27: 20 mg via ORAL
  Filled 2017-03-27 (×2): qty 2.5

## 2017-03-27 MED ORDER — CLINDAMYCIN PHOSPHATE 600 MG/50ML IV SOLN
600.0000 mg | Freq: Three times a day (TID) | INTRAVENOUS | Status: DC
  Administered 2017-03-27 – 2017-03-30 (×9): 600 mg via INTRAVENOUS
  Filled 2017-03-27 (×11): qty 50

## 2017-03-27 MED ORDER — POTASSIUM CHLORIDE CRYS ER 20 MEQ PO TBCR
40.0000 meq | EXTENDED_RELEASE_TABLET | Freq: Once | ORAL | Status: DC
  Filled 2017-03-27: qty 2

## 2017-03-27 NOTE — Evaluation (Signed)
Physical Therapy Evaluation Patient Details Name: [name] MRN: [id number] DOB: 1950-10-06 Today's Date: 03/27/2017   History of Present Illness  Pt admitted for acute on chronic respiratory illness with hypoxia. Stay complicated by seizures noted secondary to hypoxia on 9/18, 9/19, and 9/20. History includes asthma, COPD on home 2L of O2, depression, DM, GERD, HTN, and old CVA. Pt is deaf, however understands lip reading, no interpreter present at this time.  Clinical Impression  Pt is a pleasant 66 year old female who was admitted for acute on chronic respiratory illness. Pt is at high risk for seizures as she has witnessed seizure each day this week due to hypoxia. Pt performs bed mobility, transfers, and ambulation with cga and RW, however limited secondary to HFNC. Pt demonstrates deficits with strength/mobility/endurance. Would benefit from skilled PT to address above deficits and promote optimal return to PLOF. Recommend transition to [facility] upon discharge from acute hospitalization. Pt very hopeful about going home, reports she will have supervision and help for safety.       Follow Up Recommendations Home health PT;Supervision/Assistance - 24 hour    Equipment Recommendations  None recommended by PT    Recommendations for Other Services       Precautions / Restrictions Precautions Precautions: Fall Restrictions Weight Bearing Restrictions: No      Mobility  Bed Mobility Overal bed mobility: Needs Assistance Bed Mobility: Supine to Sit     Supine to sit: Min guard     General bed mobility comments: safe technique with cues for sequencing. Once seated at EOB, pt able to sit with upright posture and no LOB noted.  Transfers Overall transfer level: Needs assistance   Transfers: Sit to/from Stand Sit to Stand: Min guard         General transfer comment: safe technique performed with RW. Once standing, pt able to stand with upright posture. Increased labored  breathing noted. All mobility performed on HFNC with sats at 92%.  Ambulation/Gait Ambulation/Gait assistance: Min guard Ambulation Distance (Feet): 14 Feet Assistive device: Rolling walker (2 wheeled) Gait Pattern/deviations: Step-to pattern     General Gait Details: Pt ambulated with side stepping using RW at EOB secondary to HFNC. Pt de-sats with minimal exertion down to 88%. Further ambulation deferred secondary to risk for seizures. Pt demonstrates safe technique with no LOB noted.  Stairs            Wheelchair Mobility    Modified Rankin (Stroke Patients Only)       Balance Overall balance assessment: Needs assistance Sitting-balance support: Feet unsupported;Single extremity supported Sitting balance-Leahy Scale: Good     Standing balance support: Bilateral upper extremity supported Standing balance-Leahy Scale: Good                               Pertinent Vitals/Pain Pain Assessment: No/denies pain    Home Living Family/patient expects to be discharged to:: Private residence Living Arrangements: Alone Available Help at Discharge: Family Type of Home: House Home Access: Level entry     Home Layout: One level Home Equipment: Environmental consultant - 2 wheels;Cane - single point      Prior Function Level of Independence: Independent         Comments: reports she was fully independent, did not need AD for mobility prior to admission     Hand Dominance        Extremity/Trunk Assessment   Upper Extremity Assessment Upper Extremity  Assessment: Overall WFL for tasks assessed    Lower Extremity Assessment Lower Extremity Assessment: Generalized weakness (R LE grossly 4/5; L LE grossly 5/5)       Communication   Communication: Deaf  Cognition Arousal/Alertness: Awake/alert Behavior During Therapy: WFL for tasks assessed/performed Overall Cognitive Status: Within Functional Limits for tasks assessed                                         General Comments      Exercises Other Exercises Other Exercises: Pt performed B LE ther-ex including ankle pumps, quad sets, hip abd/add, heel slides, and SLRs. All ther-ex performed x 10 reps with cga. Pt fatigues quickly and needs rest break on R side.   Assessment/Plan    PT Assessment Patient needs continued PT services  PT Problem List Decreased strength;Decreased balance;Decreased mobility;Decreased knowledge of use of DME       PT Treatment Interventions Gait training;DME instruction;Therapeutic exercise    PT Goals (Current goals can be found in the Care Plan section)  Acute Rehab PT Goals Patient Stated Goal: to get stronger PT Goal Formulation: With patient Time For Goal Achievement: 04/10/17 Potential to Achieve Goals: Good    Frequency Min 2X/week   Barriers to discharge        Co-evaluation               AM-PAC PT "6 Clicks" Daily Activity  Outcome Measure Difficulty turning over in bed (including adjusting bedclothes, sheets and blankets)?: Unable Difficulty moving from lying on back to sitting on the side of the bed? : Unable Difficulty sitting down on and standing up from a chair with arms (e.g., wheelchair, bedside commode, etc,.)?: Unable Help needed moving to and from a bed to chair (including a wheelchair)?: A Little Help needed walking in hospital room?: A Little Help needed climbing 3-5 steps with a railing? : A Lot 6 Click Score: 11    End of Session Equipment Utilized During Treatment: Gait belt Activity Tolerance: Patient tolerated treatment well Patient left: in bed Nurse Communication: Mobility status PT Visit Diagnosis: Muscle weakness (generalized) (M62.81);Difficulty in walking, not elsewhere classified (R26.2);Unsteadiness on feet (R26.81)    Time: 6859-9234 PT Time Calculation (min) (ACUTE ONLY): 21 min   Charges:   PT Evaluation $PT Eval Moderate Complexity: 1 Mod PT Treatments $Therapeutic Exercise: 8-22  mins   PT G Codes:   PT G-Codes **NOT FOR INPATIENT CLASS** Functional Assessment Tool Used: AM-PAC 6 Clicks Basic Mobility Functional Limitation: Mobility: Walking and moving around Mobility: Walking and Moving Around Current Status (Z4436): At least 60 percent but less than 80 percent impaired, limited or restricted Mobility: Walking and Moving Around Goal Status [phone]): At least 40 percent but less than 60 percent impaired, limited or restricted    Greggory Stallion, PT, DPT [phone]   Wendell Fiebig 03/27/2017, 2:45 PM

## 2017-03-27 NOTE — Progress Notes (Signed)
Pharmacy Antibiotic Note  [name] is a 66 y.o. female admitted on 03/24/2017 with aspiration pneumonia.  Pharmacy has been consulted for clindamycin dosing.   Plan: Initiate Clindamycin 600 mg q8h   Height: _0  (157.5 cm) Weight: 160 lb (72.6 kg) IBW/kg (Calculated) : 50.1  Temp (24hrs), Avg:98 F (36.7 C), Min:97.7 F (36.5 C), Max:98.2 F (36.8 C)   Recent Labs Lab 03/24/17 1738 03/24/17 1853 03/25/17 0301 03/26/17 0523 03/27/17 0434  WBC 8.0  --  7.8 11.8*  --   CREATININE 0.42*  --  0.45 0.42* 0.47  LATICACIDVEN  --  1.1 1.8  --   --     Estimated Creatinine Clearance: 65.4 mL/min (by C-G formula based on SCr of 0.47 mg/dL).    Allergies  Allergen Reactions  . Aspirin Itching  . Celebrex [Celecoxib] Itching    itching  . Ciprofloxacin Itching  . Codeine Itching  . Fosphenytoin Itching  . Levaquin [Levofloxacin In D5w] Itching  . Levofloxacin Itching  . Lovastatin Itching  . Penicillins     Documentation indicates severe reaction  Pt tolerated cephalosporin without adverse reaction 09/18   . Pravastatin Itching  . Sulfa Antibiotics Itching    Antimicrobials this admission: 9/19 Aztreonam >> 9/19 9/18 Ceftriaxone >> 9/20 9/19 Vancomycin >> 9/19 9/21 Clindamycin >>   Dose adjustments this admission: N/A  Microbiology results: 9/18 BCx: no growth in 3 days  9/19 MRSA PCR: negative  Thank you for allowing pharmacy to be a part of this patient's care.  Durwin Reges, PharmD Student 03/27/2017 1:39 PM

## 2017-03-27 NOTE — Progress Notes (Signed)
MEDICATION RELATED CONSULT NOTE - INITIAL   Pharmacy Consult for electrolyte monitoring  Allergies  Allergen Reactions  . Aspirin Itching  . Celebrex [Celecoxib] Itching    itching  . Ciprofloxacin Itching  . Codeine Itching  . Fosphenytoin Itching  . Levaquin [Levofloxacin In D5w] Itching  . Levofloxacin Itching  . Lovastatin Itching  . Penicillins     Documentation indicates severe reaction  Pt tolerated cephalosporin without adverse reaction 09/18   . Pravastatin Itching  . Sulfa Antibiotics Itching    Patient Measurements: Height: _0  (157.5 cm) Weight: 160 lb (72.6 kg) IBW/kg (Calculated) : 50.1  Vital Signs: Temp: 98.2 F (36.8 C) (09/21 1200) Temp Source: Oral (09/21 1200) BP: 133/59 (09/21 1300) Pulse Rate: 61 (09/21 1300) Intake/Output from previous day: 09/20 0701 - 09/21 0700 In: 338 [P.O.:120; I.V.:3; IV Piggyback:215] Out: 900 [Urine:900] Intake/Output from this shift: Total I/O In: 120 [P.O.:120] Out: -   Labs:  Recent Labs  03/24/17 1738 03/25/17 0301 03/26/17 0523 03/27/17 0434  WBC 8.0 7.8 11.8*  --   HGB 15.9 14.4 14.5  --   HCT 45.0 40.7 41.9  --   PLT 278 230 230  --   CREATININE 0.42* 0.45 0.42* 0.47  MG  --  1.4* 2.0 1.6*  PHOS  --  3.0 3.1  --   ALBUMIN  --  3.0*  --   --   PROT  --  5.9*  --   --   AST  --  23  --   --   ALT  --  11*  --   --   ALKPHOS  --  94  --   --   BILITOT  --  0.6  --   --    Estimated Creatinine Clearance: 65.4 mL/min (by C-G formula based on SCr of 0.47 mg/dL).    Assessment: LS is a 3 YOF with a known history of asthma, home O2-dependent COPD, depression, DM, GERD, HTN, seizure disorder and CVA that presented to the emergency department for evaluation of SOB. Patient currently taking furosemide 40 mg PO daily and losartan 100 mg PO daily, and HCTZ 12.5 mg PO daily.   Plan:  Gave potassium chloride 40 mEq PO x 1 dose Initiate 4g of magnesium sulfate x 1 dose  Will continue to monitor with  am labs. Pharmacy will continue to follow per consult.  Durwin Reges, PharmD Student 03/27/2017,1:50 PM

## 2017-03-27 NOTE — Progress Notes (Signed)
Subjective: Patient more alert today.  Continues to require O2.  No further seizures overnight.    Objective: Current vital signs: BP (!) 111/57   Pulse 60   Temp 98 F (36.7 C) (Oral)   Resp (!) 22   Ht _0  (1.575 m)   Wt 72.6 kg (160 lb)   SpO2 98%   BMI 29.26 kg/m  Vital signs in last 24 hours: Temp:  [97.7 F (36.5 C)-98.2 F (36.8 C)] 98 F (36.7 C) (09/21 0800) Pulse Rate:  [56-84] 60 (09/21 1000) Resp:  [16-36] 22 (09/21 1000) BP: (86-138)/(50-109) 111/57 (09/21 1000) SpO2:  [91 %-98 %] 98 % (09/21 1000) FiO2 (%):  [50 %-100 %] 60 % (09/21 1005)  Intake/Output from previous day: 09/20 0701 - 09/21 0700 In: 338 [P.O.:120; I.V.:3; IV Piggyback:215] Out: 900 [Urine:900] Intake/Output this shift: Total I/O In: 120 [P.O.:120] Out: -  Nutritional status: Diet Carb Modified Fluid consistency: Thin; Room service appropriate? Yes  Neurologic Exam: Mental Status: Alert.  Follows commands.  Reading lips well.   Cranial Nerves: II: Discs flat bilaterally; Visual fields grossly normal, pupils equal, round, reactive to light and accommodation III,IV, VI: ptosis not present, extra-ocular motions intact bilaterally V,VII: smile symmetric, facial light touch sensation normal bilaterally VIII: deaf IX,X: gag reflex present XI: bilateral shoulder shrug XII: midline tongue extension Motor: Moves all extremities against gravity.  No focal weakness noted.   Sensory: Pinprick and light touch intact throughout, bilaterally  Lab Results: Basic Metabolic Panel:  Recent Labs Lab 03/24/17 1738 03/25/17 0301 03/26/17 0523 03/27/17 0434  NA 139 139 141 139  K 2.3* 3.1* 5.2* 3.4*  CL 96* 100* 107 98*  CO2 _1 GLUCOSE 163* 179* 141* 155*  BUN _2 CREATININE 0.42* 0.45 0.42* 0.47  CALCIUM 9.8 8.6* 8.7* 8.5*  MG  --  1.4* 2.0  --   PHOS  --  3.0 3.1  --     Liver Function Tests:  Recent Labs Lab 03/25/17 0301  AST 23  ALT 11*  ALKPHOS 94  BILITOT  0.6  PROT 5.9*  ALBUMIN 3.0*   No results for input(s): LIPASE, AMYLASE in the last 168 hours. No results for input(s): AMMONIA in the last 168 hours.  CBC:  Recent Labs Lab 03/24/17 1738 03/25/17 0301 03/26/17 0523  WBC 8.0 7.8 11.8*  NEUTROABS 4.8  --   --   HGB 15.9 14.4 14.5  HCT 45.0 40.7 41.9  MCV 80.9 81.7 83.7  PLT 278 230 230    Cardiac Enzymes:  Recent Labs Lab 03/24/17 1738 03/25/17 0301 03/25/17 1012  TROPONINI 0.03* <0.03 <0.03    Lipid Panel: No results for input(s): CHOL, TRIG, HDL, CHOLHDL, VLDL, LDLCALC in the last 168 hours.  CBG:  Recent Labs Lab 03/26/17 0715 03/26/17 1144 03/26/17 1704 03/26/17 2234 03/27/17 0745  GLUCAP 144* 129* 163* 239* 173*    Microbiology: Results for orders placed or performed during the hospital encounter of 03/24/17  Blood culture (routine x 2)     Status: None (Preliminary result)   Collection Time: 03/24/17  7:10 PM  Result Value Ref Range Status   Specimen Description BLOOD BLOOD LEFT FOREARM BLOOD RIGHT FOREARM  Final   Special Requests   Final    BOTTLES DRAWN AEROBIC AND ANAEROBIC Blood Culture adequate volume   Culture NO GROWTH 3 DAYS  Final   Report Status PENDING  Incomplete  Blood culture (routine x 2)  Status: None (Preliminary result)   Collection Time: 03/24/17  7:11 PM  Result Value Ref Range Status   Specimen Description BLOOD BLOOD LEFT FOREARM  Final   Special Requests   Final    BOTTLES DRAWN AEROBIC AND ANAEROBIC Blood Culture adequate volume   Culture NO GROWTH 3 DAYS  Final   Report Status PENDING  Incomplete  MRSA PCR Screening     Status: None   Collection Time: 03/25/17  2:44 AM  Result Value Ref Range Status   MRSA by PCR NEGATIVE NEGATIVE Final    Comment:        The GeneXpert MRSA Assay (FDA approved for NASAL specimens only), is one component of a comprehensive MRSA colonization surveillance program. It is not intended to diagnose MRSA infection nor to guide  or monitor treatment for MRSA infections.     Coagulation Studies: No results for input(s): LABPROT, INR in the last 72 hours.  Imaging: Dg Chest Port 1 View  Result Date: 03/26/2017 CLINICAL DATA:  Respiratory failure. EXAM: PORTABLE CHEST 1 VIEW COMPARISON:  Chest x-ray dated March 24, 2017. FINDINGS: Increasing density within the lingula and left lower lobe, obscuring the left heart border and left diaphragm, respectively. Probable small left pleural effusion. The right lung is clear. No pneumothorax. The trachea remains midline. IMPRESSION: 1. Worsening consolidation within the lingula and left lower lobe, suspicious for multifocal pneumonia. Probable small left pleural effusion. Follow up to resolution is recommended. Consider chest CT for further evaluation as clinically indicated. Electronically Signed   By: Titus Dubin M.D.   On: 03/26/2017 11:04    Medications:  I have reviewed the patient's current medications. Scheduled: . acidophilus  1 capsule Oral BID  . budesonide (PULMICORT) nebulizer solution  0.25 mg Nebulization Q6H  . citalopram  20 mg Oral QHS  . clopidogrel  75 mg Oral Daily  . diltiazem  180 mg Oral Daily  . enoxaparin (LOVENOX) injection  40 mg Subcutaneous Q24H  . famotidine  20 mg Oral Daily  . furosemide  40 mg Oral Daily  . gabapentin  300 mg Oral BID  . losartan  100 mg Oral Daily   And  . hydrochlorothiazide  12.5 mg Oral Daily  . insulin aspart  0-15 Units Subcutaneous TID WC  . insulin aspart  0-5 Units Subcutaneous QHS  . ipratropium-albuterol  3 mL Nebulization Q6H  . lacosamide  150 mg Oral BID  . pantoprazole  40 mg Oral Daily  . rOPINIRole  0.25 mg Oral TID  . sodium chloride flush  3 mL Intravenous Q12H    Assessment/Plan: Patient with no seizures overnight.  Appears back to baseline today.  On increased dose of Vimpat.   Recommendations: 1.  Would continue Vimpat at 153m BID 2.  Continue seizure precautions.     LOS: 3 days    LAlexis Goodell MD Neurology [phone]/21/2018  11:09 AM

## 2017-03-27 NOTE — Progress Notes (Signed)
[facility] [place] NAME: [name]    MR#:  [id number]  DATE OF BIRTH:  1950/12/08  SUBJECTIVE:   Patient had seizure yesterday. She is now on high flow oxygen. She continues to have cough and feel short of breath  REVIEW OF SYSTEMS:    Review of Systems  Constitutional: Negative for fever, chills weight loss She is deaf HENT: Negative for ear pain, nosebleeds, congestion, facial swelling, rhinorrhea, neck pain, neck stiffness and ear discharge.   Respiratory: Positive cough and shortness of breath no wheezing Cardiovascular: Negative for chest pain, palpitations and leg swelling.  Gastrointestinal: Negative for heartburn, abdominal pain, vomiting, diarrhea or consitpation Genitourinary: Negative for dysuria, urgency, frequency, hematuria Musculoskeletal: Negative for back pain or joint pain Neurological: Negative for dizziness, seizures, syncope, focal weakness,  numbness and headaches.  Hematological: Does not bruise/bleed easily.  Psychiatric/Behavioral: Negative for hallucinations, confusion, dysphoric mood    Tolerating Diet: yes      DRUG ALLERGIES:   Allergies  Allergen Reactions  . Aspirin Itching  . Celebrex [Celecoxib] Itching    itching  . Ciprofloxacin Itching  . Codeine Itching  . Fosphenytoin Itching  . Levaquin [Levofloxacin In D5w] Itching  . Levofloxacin Itching  . Lovastatin Itching  . Penicillins     Documentation indicates severe reaction  Pt tolerated cephalosporin without adverse reaction 09/18   . Pravastatin Itching  . Sulfa Antibiotics Itching    VITALS:  Blood pressure (!) 111/57, pulse 60, temperature 98 F (36.7 C), temperature source Oral, resp. rate (!) 22, height 5' 2" (1.575 m), weight 72.6 kg (160 lb), SpO2 98 %.  PHYSICAL EXAMINATION:  Constitutional: Appears well-developed and well-nourished. No distress. HENT: Normocephalic. Marland Kitchen Oropharynx is clear and moist.  Eyes:  Conjunctivae and EOM are normal. PERRLA, no scleral icterus.  Neck: Normal ROM. Neck supple. No JVD. No tracheal deviation. CVS: RRR, S1/S2 +, no murmurs, no gallops, no carotid bruit.  Pulmonary: Respiratory effort is normal. She has crackles at the left base with decreased breath sounds. No wheezing. Abdominal: Soft. BS +,  no distension, tenderness, rebound or guarding.  Musculoskeletal: Normal range of motion. No edema and no tenderness.  Neuro: Alert. CN 2-12 grossly intact. No focal deficits. Skin: Skin is warm and dry. No rash noted. Psychiatric: Normal mood and affect.      LABORATORY PANEL:   CBC  Recent Labs Lab 03/26/17 0523  WBC 11.8*  HGB 14.5  HCT 41.9  PLT 230   ------------------------------------------------------------------------------------------------------------------  Chemistries   Recent Labs Lab 03/25/17 0301 03/26/17 0523 03/27/17 0434  NA 139 141 139  K 3.1* 5.2* 3.4*  CL 100* 107 98*  CO2 _0 GLUCOSE 179* 141* 155*  BUN _1 CREATININE 0.45 0.42* 0.47  CALCIUM 8.6* 8.7* 8.5*  MG 1.4* 2.0  --   AST 23  --   --   ALT 11*  --   --   ALKPHOS 94  --   --   BILITOT 0.6  --   --    ------------------------------------------------------------------------------------------------------------------  Cardiac Enzymes  Recent Labs Lab 03/24/17 1738 03/25/17 0301 03/25/17 1012  TROPONINI 0.03* <0.03 <0.03   ------------------------------------------------------------------------------------------------------------------  RADIOLOGY:  Dg Chest Port 1 View  Result Date: 03/26/2017 CLINICAL DATA:  Respiratory failure. EXAM: PORTABLE CHEST 1 VIEW COMPARISON:  Chest x-ray dated March 24, 2017. FINDINGS: Increasing density within the lingula and left lower lobe, obscuring the left heart border and  left diaphragm, respectively. Probable small left pleural effusion. The right lung is clear. No pneumothorax. The trachea remains midline.  IMPRESSION: 1. Worsening consolidation within the lingula and left lower lobe, suspicious for multifocal pneumonia. Probable small left pleural effusion. Follow up to resolution is recommended. Consider chest CT for further evaluation as clinically indicated. Electronically Signed   By: Titus Dubin M.D.   On: 03/26/2017 11:04     ASSESSMENT AND PLAN:    66 year old female with a history of deafness, chronic respiratory failure on 2 L of oxygen due to COPD and diabetes who presents with shortness of breath.  1. Acute on chronic hypoxic respiratory failure due to What was initially thought to be healthcare acquired pneumonia however now appears to be aspiration pneumonia/multifocal pneumonia and seizure Intensivist has change antibiotics to clindamycin Wean oxygen as tolerated to baseline at 2 L nasal cannula 2. Seizure disorder: Patient has had 2 witnessed seizures in the hospital. Neurology consultation is appreciated. Continue increased dose of Vimpat 100 mg BID  3. Hyperkalemia: This has resolved 4. Essential hypertension: Continue losartan/HCTZ and diltiazem  5. Diabetes: Continue sliding scale and ADA diet Holding glipizide for now  6. Depression: Continue  Celexa 7. Evident neuropathy: Continue gabapentin 8. Restless leg syndrome: Continue Requip   Physical therapy consultation for disposition planning Management plans discussed with the patient and family and she is in agreement.  CODE STATUS: full  TOTAL TIME TAKING CARE OF THIS PATIENT: 24 minutes.   Discussed with intensivist  POSSIBLE D/C 2-3 days DEPENDING ON CLINICAL CONDITION.   Katalaya Beel M.D on 03/27/2017 at 11:27 AM  Between 7am to 6pm - Pager - [phone] After 6pm go to [email or web address] - password [email or web address] [facility]  Office  [phone]  CC: Primary care physician; Cletis Athens, MD  Note: This dictation was prepared with Dragon dictation along with smaller phrase technology.  Any transcriptional errors that result from this process are unintentional.

## 2017-03-27 NOTE — Progress Notes (Signed)
Fio2 weaning attempted several times tonight from 100% 35L HFNC. Pt immediately desaturates to low 80's at 90% Fio2. Pt also sleeping with mouth open, maybe weaning will be more successful once patient is awake and alert.

## 2017-03-27 NOTE — Progress Notes (Signed)
[facility] Progess Note    SYNOPSIS   66 yo female with seizure disorder now with seizure and possible aspiration pneumonia with LLL atelectasis.   ASSESSMENT/PLAN   Acute on chronic respiratory failure secondary to AECOPD and possible aspiration pneumonia and LLL collapse  The acute seizures appear to be due to missed doses of her maintenance anti-convulsants and/or precipitated by hypoxia.  Chronic O2 dependent COPD H/O OSA, CVA Seizure disorder Deafness  PLAN/REC: Supplemental O2 to maintain O2 sats 88% to 92% Cont current COPD Rx Abx for possible aspiration pneumonia precipitated by seizure.  Repeat CXR in am  Trend WBC and monitor fever curve Trend PCT  Continuous telemetry monitoring  Prn EKG  Neuro consulted appreciate input-continue vimpat per recommendations  Seizure precautions PT consulted    Images personally reviewed; LLL atelectasis.   INTAKE / OUTPUT:  Intake/Output Summary (Last 24 hours) at 03/27/17 0854 Last data filed at 03/27/17 0600  Gross per 24 hour  Intake              218 ml  Output              900 ml  Net             -682 ml    Micro/culture results: MRSA PCR 9/19; negative.  BCx2 9/18; NTD UC -- Sputum--  Antibiotics: Ceftriaxone 9/18>>9/21 Zosyn 9/21>>  Best Practices  DVT Prophylaxis: enoxaparin GI Prophylaxis: famotidine.   ---------------------------------------   ----------------------------------------   Name: [name] MRN: [id number] DOB: [birth year]    ADMISSION DATE:  03/24/2017   SUBJECTIVE:   Pt awake and alert, no new complaints, though she is on 98% high flow.   Review of Systems:  Constitutional: Feels well. Cardiovascular: No chest pain.  Pulmonary: Denies dyspnea.   10 point review of systems was negative  other than what is documented in the HPI.    VITAL SIGNS: Temp:  [97.7 F (36.5 C)-98.2 F (36.8 C)] 98 F (36.7 C) (09/21 0800) Pulse Rate:  [56-84] 57  (09/21 0800) Resp:  [21-36] 25 (09/21 0800) BP: (86-156)/(50-109) 125/61 (09/21 0800) SpO2:  [80 %-98 %] 94 % (09/21 0822) FiO2 (%):  [50 %-100 %] 100 % (09/21 0822)     PHYSICAL EXAMINATION: Physical Examination:   VS: BP 125/61 (BP Location: Right Arm)   Pulse (!) 57   Temp 98 F (36.7 C) (Oral)   Resp (!) 25   Ht 5' 2" (1.575 m)   Wt 160 lb (72.6 kg)   SpO2 94%   BMI 29.26 kg/m   General Appearance: No distress  Neuro:without focal findings, mental status normal. HEENT: PERRLA, EOM intact. Pulmonary: normal breath sounds   CardiovascularNormal S1,S2.  No m/r/g.   Abdomen: Benign, Soft, non-tender. Renal:  No costovertebral tenderness  GU:  Not performed at this time. Endocrine: No evident thyromegaly. Skin:   warm, no rashes, no ecchymosis  Extremities: normal, no cyanosis, clubbing.    LABORATORY PANEL:   CBC  Recent Labs Lab 03/26/17 0523  WBC 11.8*  HGB 14.5  HCT 41.9  PLT 230    Chemistries   Recent Labs Lab 03/25/17 0301 03/26/17 0523 03/27/17 0434  NA 139 141 139  K 3.1* 5.2* 3.4*  CL 100* 107 98*  CO2 _0 GLUCOSE 179* 141* 155*  BUN _1 CREATININE 0.45 0.42* 0.47  CALCIUM 8.6* 8.7* 8.5*  MG 1.4* 2.0  --   PHOS 3.0  3.1  --   AST 23  --   --   ALT 11*  --   --   ALKPHOS 94  --   --   BILITOT 0.6  --   --      Recent Labs Lab 03/25/17 2217 03/26/17 0715 03/26/17 1144 03/26/17 1704 03/26/17 2234 03/27/17 0745  GLUCAP 113* 144* 129* 163* 239* 173*   No results for input(s): PHART, PCO2ART, PO2ART in the last 168 hours.  Recent Labs Lab 03/25/17 0301  AST 23  ALT 11*  ALKPHOS 94  BILITOT 0.6  ALBUMIN 3.0*    Cardiac Enzymes  Recent Labs Lab 03/25/17 1012  TROPONINI <0.03    RADIOLOGY:  Dg Chest Port 1 View  Result Date: 03/26/2017 CLINICAL DATA:  Respiratory failure. EXAM: PORTABLE CHEST 1 VIEW COMPARISON:  Chest x-ray dated March 24, 2017. FINDINGS: Increasing density within the lingula and  left lower lobe, obscuring the left heart border and left diaphragm, respectively. Probable small left pleural effusion. The right lung is clear. No pneumothorax. The trachea remains midline. IMPRESSION: 1. Worsening consolidation within the lingula and left lower lobe, suspicious for multifocal pneumonia. Probable small left pleural effusion. Follow up to resolution is recommended. Consider chest CT for further evaluation as clinically indicated. Electronically Signed   By: Titus Dubin M.D.   On: 03/26/2017 11:04        --Marda Stalker, MD.  ICU Pager: [phone] [facility] Office Number: [phone]   03/27/2017

## 2017-03-28 LAB — CBC
HCT: 41.1 % (ref 35.0–47.0)
HEMOGLOBIN: 14.2 g/dL (ref 12.0–16.0)
MCH: 28.8 pg (ref 26.0–34.0)
MCHC: 34.6 g/dL (ref 32.0–36.0)
MCV: 83.3 fL (ref 80.0–100.0)
PLATELETS: 220 10*3/uL (ref 150–440)
RBC: 4.94 MIL/uL (ref 3.80–5.20)
RDW: 13.5 % (ref 11.5–14.5)
WBC: 6.4 10*3/uL (ref 3.6–11.0)

## 2017-03-28 LAB — MAGNESIUM: MAGNESIUM: 1.8 mg/dL (ref 1.7–2.4)

## 2017-03-28 LAB — GLUCOSE, CAPILLARY
GLUCOSE-CAPILLARY: 148 mg/dL — AB (ref 65–99)
GLUCOSE-CAPILLARY: 172 mg/dL — AB (ref 65–99)
Glucose-Capillary: 199 mg/dL — ABNORMAL HIGH (ref 65–99)
Glucose-Capillary: 195 mg/dL — ABNORMAL HIGH (ref 65–99)

## 2017-03-28 LAB — BASIC METABOLIC PANEL
Anion gap: 8 (ref 5–15)
BUN: 9 mg/dL (ref 6–20)
CALCIUM: 8.5 mg/dL — AB (ref 8.9–10.3)
CO2: 30 mmol/L (ref 22–32)
Chloride: 98 mmol/L — ABNORMAL LOW (ref 101–111)
Creatinine, Ser: 0.5 mg/dL (ref 0.44–1.00)
GFR calc Af Amer: >60 mL/min (ref >60)
GLUCOSE: 185 mg/dL — AB (ref 65–99)
Potassium: 3.6 mmol/L (ref 3.5–5.1)
SODIUM: 136 mmol/L (ref 135–145)

## 2017-03-28 LAB — PHOSPHORUS: Phosphorus: 3.6 mg/dL (ref 2.5–4.6)

## 2017-03-28 LAB — PROCALCITONIN: Procalcitonin: <0.1 ng/mL

## 2017-03-28 MED ORDER — FAMOTIDINE 20 MG PO TABS
20.0000 mg | ORAL_TABLET | Freq: Every day | ORAL | Status: DC
  Administered 2017-03-28 – 2017-03-30 (×3): 20 mg via ORAL
  Filled 2017-03-28 (×3): qty 1

## 2017-03-28 MED ORDER — MAGNESIUM SULFATE 2 GM/50ML IV SOLN
2.0000 g | Freq: Once | INTRAVENOUS | Status: AC
  Administered 2017-03-28: 2 g via INTRAVENOUS
  Filled 2017-03-28: qty 50

## 2017-03-28 NOTE — Progress Notes (Signed)
MEDICATION RELATED CONSULT NOTE - INITIAL   Pharmacy Consult for electrolyte monitoring  Labs: Lab Results  Component Value Date   K 3.6 03/28/2017     Recent Labs  03/26/17 0523 03/27/17 0434 03/28/17 0442  WBC 11.8*  --  6.4  HGB 14.5  --  14.2  HCT 41.9  --  41.1  PLT 230  --  220  CREATININE 0.42* 0.47 0.50  MG 2.0 1.6* 1.8  PHOS 3.1  --  3.6   Estimated Creatinine Clearance: 65.4 mL/min (by C-G formula based on SCr of 0.5 mg/dL).   Assessment: Pharmacy consulted to follow and replace electrolytes if needed. Patient is on furosemide 40 mg PO daily.  Plan:  Electrolytes WNL, no need for supplementation at this time. Will recheck with AM labs tomorrow morning.  Lenis Noon, PharmD, BCPS Clinical Pharmacist 03/28/2017,7:30 AM

## 2017-03-28 NOTE — Progress Notes (Signed)
MEDICATION RELATED CONSULT NOTE - INITIAL   Pharmacy Consult for electrolyte monitoring  Labs: Lab Results  Component Value Date   K 3.6 03/28/2017     Recent Labs  03/26/17 0523 03/27/17 0434 03/28/17 0442  WBC 11.8*  --  6.4  HGB 14.5  --  14.2  HCT 41.9  --  41.1  PLT 230  --  220  CREATININE 0.42* 0.47 0.50  MG 2.0 1.6* 1.8  PHOS 3.1  --  3.6   Estimated Creatinine Clearance: 65.4 mL/min (by C-G formula based on SCr of 0.5 mg/dL).   Assessment: Pharmacy consulted to follow and replace electrolytes if needed. Patient is on furosemide 40 mg PO daily.  Plan:  Will give magnesium sulfate 2 g iv once and f/u AM labs.   Napoleon Form, PharmD, BCPS Clinical Pharmacist 03/28/2017,2:40 PM

## 2017-03-28 NOTE — Progress Notes (Signed)
Pt remains alert and oriented, no complaints of pain.  NSR, lungs clear, on HFNC.  Vital signs stable, afebrile.  Pt up to [facility] voiding.  Report given to Velna Hatchet, RN taking over care of pt.

## 2017-03-28 NOTE — Progress Notes (Signed)
[place] [facility] [place] NAME: [name]    MR#:  [id number]  DATE OF BIRTH:  06/30/1951  SUBJECTIVE:   Patient without seizures overnight Still on HFNC  REVIEW OF SYSTEMS:    Review of Systems  Constitutional: Negative for fever, chills weight loss She is deaf HENT: Negative for ear pain, nosebleeds, congestion, facial swelling, rhinorrhea, neck pain, neck stiffness and ear discharge.   Respiratory: Positive cough and shortness of breath no wheezing Cardiovascular: Negative for chest pain, palpitations and leg swelling.  Gastrointestinal: Negative for heartburn, abdominal pain, vomiting, diarrhea or consitpation Genitourinary: Negative for dysuria, urgency, frequency, hematuria Musculoskeletal: Negative for back pain or joint pain Neurological: Negative for dizziness, seizures, syncope, focal weakness,  numbness and headaches.  Hematological: Does not bruise/bleed easily.  Psychiatric/Behavioral: Negative for hallucinations, confusion, dysphoric mood    Tolerating Diet: yes      DRUG ALLERGIES:   Allergies  Allergen Reactions  . Aspirin Itching  . Celebrex [Celecoxib] Itching    itching  . Ciprofloxacin Itching  . Codeine Itching  . Fosphenytoin Itching  . Levaquin [Levofloxacin In D5w] Itching  . Levofloxacin Itching  . Lovastatin Itching  . Penicillins     Documentation indicates severe reaction  Pt tolerated cephalosporin without adverse reaction 09/18   . Pravastatin Itching  . Sulfa Antibiotics Itching    VITALS:  Blood pressure 136/61, pulse 64, temperature 98.2 F (36.8 C), temperature source Oral, resp. rate (!) 29, height 5' 2" (1.575 m), weight 72.6 kg (160 lb), SpO2 93 %.  PHYSICAL EXAMINATION:  Constitutional: Appears well-developed and well-nourished. No distress. HENT: Normocephalic. Marland Kitchen Oropharynx is clear and moist.  Eyes: Conjunctivae and EOM are normal. PERRLA, no scleral icterus.  Neck: Normal  ROM. Neck supple. No JVD. No tracheal deviation. CVS: RRR, S1/S2 +, no murmurs, no gallops, no carotid bruit.  Pulmonary: Respiratory effort is normal. She has crackles at the left base with decreased breath sounds. No wheezing. Abdominal: Soft. BS +,  no distension, tenderness, rebound or guarding.  Musculoskeletal: Normal range of motion. No edema and no tenderness.  Neuro: Alert. CN 2-12 grossly intact. No focal deficits. Skin: Skin is warm and dry. No rash noted. Psychiatric: Normal mood and affect.      LABORATORY PANEL:   CBC  Recent Labs Lab 03/28/17 0442  WBC 6.4  HGB 14.2  HCT 41.1  PLT 220   ------------------------------------------------------------------------------------------------------------------  Chemistries   Recent Labs Lab 03/25/17 0301  03/28/17 0442  NA 139  < > 136  K 3.1*  < > 3.6  CL 100*  < > 98*  CO2 29  < > 30  GLUCOSE 179*  < > 185*  BUN 6  < > 9  CREATININE 0.45  < > 0.50  CALCIUM 8.6*  < > 8.5*  MG 1.4*  < > 1.8  AST 23  --   --   ALT 11*  --   --   ALKPHOS 94  --   --   BILITOT 0.6  --   --   < > = values in this interval not displayed. ------------------------------------------------------------------------------------------------------------------  Cardiac Enzymes  Recent Labs Lab 03/24/17 1738 03/25/17 0301 03/25/17 1012  TROPONINI 0.03* <0.03 <0.03   ------------------------------------------------------------------------------------------------------------------  RADIOLOGY:  No results found.   ASSESSMENT AND PLAN:    66 year old female with a history of deafness, chronic respiratory failure on 2 L of oxygen due to COPD and diabetes who presents with shortness  of breath.  1. Acute on chronic hypoxic respiratory failure due to What was initially thought to be healthcare acquired pneumonia however now appears to be aspiration pneumonia/multifocal pneumonia and seizure Intensivist has change antibiotics to  clindamycin Wean oxygen as tolerated to baseline at 2 L nasal cannula  2. Seizure disorder: Patient has had 2 witnessed seizures in the hospital. Neurology consultation is appreciated. Continue increased dose of Vimpat 150 mg BID  3. Hyperkalemia: This has resolved 4. Essential hypertension: Continue losartan/HCTZ and diltiazem  5. Diabetes: Continue sliding scale and ADA diet Holding glipizide for now  6. Depression: Continue  Celexa 7. Evident neuropathy: Continue gabapentin 8. Restless leg syndrome: Continue Requip   Physical therapy is recommending home with home health upon discharge.   Management plans discussed with the patient and family and she is in agreement.  CODE STATUS: full  TOTAL TIME TAKING CARE OF THIS PATIENT: 24 minutes.     POSSIBLE D/C 2-3 days DEPENDING ON CLINICAL CONDITION.   Ksean Vale M.D on 03/28/2017 at 11:22 AM  Between 7am to 6pm - Pager - [phone] After 6pm go to [email or web address] - password [email or web address] [facility]  Office  [phone]  CC: Primary care physician; Cletis Athens, MD  Note: This dictation was prepared with Dragon dictation along with smaller phrase technology. Any transcriptional errors that result from this process are unintentional.

## 2017-03-28 NOTE — Progress Notes (Signed)
[facility] Progess Note    SYNOPSIS   66 yo female with seizure disorder now with seizure and possible aspiration pneumonia with LLL atelectasis.   ASSESSMENT/PLAN   Acute on chronic respiratory failure secondary to AECOPD and possible aspiration pneumonia; now doing better; hi flow weaned to 40%.  The acute seizures appear to be due to missed doses of her maintenance anti-convulsants and/or precipitated by hypoxia.  Chronic O2 dependent COPD H/O OSA, CVA Seizure disorder Deafness  PLAN/REC: Supplemental O2 to maintain O2 sats 88% to 92% Cont current COPD Rx Abx for possible aspiration pneumonia precipitated by seizure.  Repeat CXR in am  Trend WBC and monitor fever curve Trend PCT  Continuous telemetry monitoring  Prn EKG  Neuro consulted appreciate input-continue vimpat per recommendations  Seizure precautions PT consulted    Images personally reviewed; LLL atelectasis.   INTAKE / OUTPUT:  Intake/Output Summary (Last 24 hours) at 03/28/17 0955 Last data filed at 03/28/17 0606  Gross per 24 hour  Intake              420 ml  Output              150 ml  Net              270 ml    Micro/culture results: MRSA PCR 9/19; negative.  BCx2 9/18; NTD UC -- Sputum--  Antibiotics: 9/19 Aztreonam >> 9/19 9/18 Ceftriaxone >> 9/20 9/19 Vancomycin >> 9/19 9/21 Clindamycin >>   Best Practices  DVT Prophylaxis: enoxaparin GI Prophylaxis: famotidine.   ---------------------------------------   ----------------------------------------   Name: [name] MRN: [id number] DOB: August 09, 1950    ADMISSION DATE:  03/24/2017   SUBJECTIVE:   Pt awake and alert, no new complaints, feeling better than yesterday.   Review of Systems:  Constitutional: Feels well. Cardiovascular: No chest pain.  Pulmonary: Denies dyspnea.   10 point review of systems was negative  other than what is documented in the HPI.    VITAL SIGNS: Temp:  [97.9 F  (36.6 C)-98.2 F (36.8 C)] 98.2 F (36.8 C) (09/22 0800) Pulse Rate:  [55-74] 61 (09/22 0900) Resp:  [17-34] 23 (09/22 0900) BP: (108-144)/(52-78) 124/60 (09/22 0900) SpO2:  [90 %-98 %] 93 % (09/22 0900) FiO2 (%):  [35 %-100 %] 40 % (09/22 0900)     PHYSICAL EXAMINATION: Physical Examination:   VS: BP 124/60   Pulse 61   Temp 98.2 F (36.8 C) (Oral)   Resp (!) 23   Ht _0  (1.575 m)   Wt 160 lb (72.6 kg)   SpO2 93%   BMI 29.26 kg/m   General Appearance: No distress  Neuro:without focal findings, mental status normal. HEENT: PERRLA, EOM intact. Pulmonary: normal breath sounds   CardiovascularNormal S1,S2.  No m/r/g.   Abdomen: Benign, Soft, non-tender. Renal:  No costovertebral tenderness  GU:  Not performed at this time. Endocrine: No evident thyromegaly. Skin:   warm, no rashes, no ecchymosis  Extremities: normal, no cyanosis, clubbing.    LABORATORY PANEL:   CBC  Recent Labs Lab 03/28/17 0442  WBC 6.4  HGB 14.2  HCT 41.1  PLT 220    Chemistries   Recent Labs Lab 03/25/17 0301  03/28/17 0442  NA 139  < > 136  K 3.1*  < > 3.6  CL 100*  < > 98*  CO2 29  < > 30  GLUCOSE 179*  < > 185*  BUN 6  < >  9  CREATININE 0.45  < > 0.50  CALCIUM 8.6*  < > 8.5*  MG 1.4*  < > 1.8  PHOS 3.0  < > 3.6  AST 23  --   --   ALT 11*  --   --   ALKPHOS 94  --   --   BILITOT 0.6  --   --   < > = values in this interval not displayed.   Recent Labs Lab 03/26/17 2234 03/27/17 0745 03/27/17 1146 03/27/17 1705 03/27/17 2245 03/28/17 0756  GLUCAP 239* 173* 175* 168* 196* 199*   No results for input(s): PHART, PCO2ART, PO2ART in the last 168 hours.  Recent Labs Lab 03/25/17 0301  AST 23  ALT 11*  ALKPHOS 94  BILITOT 0.6  ALBUMIN 3.0*    Cardiac Enzymes  Recent Labs Lab 03/25/17 1012  TROPONINI <0.03    RADIOLOGY:  Dg Chest Port 1 View  Result Date: 03/26/2017 CLINICAL DATA:  Respiratory failure. EXAM: PORTABLE CHEST 1 VIEW COMPARISON:   Chest x-ray dated March 24, 2017. FINDINGS: Increasing density within the lingula and left lower lobe, obscuring the left heart border and left diaphragm, respectively. Probable small left pleural effusion. The right lung is clear. No pneumothorax. The trachea remains midline. IMPRESSION: 1. Worsening consolidation within the lingula and left lower lobe, suspicious for multifocal pneumonia. Probable small left pleural effusion. Follow up to resolution is recommended. Consider chest CT for further evaluation as clinically indicated. Electronically Signed   By: Titus Dubin M.D.   On: 03/26/2017 11:04        --Marda Stalker, MD.  ICU Pager: [phone] [place] [facility] [facility] Office Number: [phone]   03/28/2017

## 2017-03-29 ENCOUNTER — Inpatient Hospital Stay: Payer: Medicare HMO

## 2017-03-29 LAB — CULTURE, BLOOD (ROUTINE X 2)
CULTURE: NO GROWTH
CULTURE: NO GROWTH
Special Requests: ADEQUATE
Special Requests: ADEQUATE

## 2017-03-29 LAB — GLUCOSE, CAPILLARY
GLUCOSE-CAPILLARY: 171 mg/dL — AB (ref 65–99)
GLUCOSE-CAPILLARY: 203 mg/dL — AB (ref 65–99)
Glucose-Capillary: 279 mg/dL — ABNORMAL HIGH (ref 65–99)
Glucose-Capillary: 225 mg/dL — ABNORMAL HIGH (ref 65–99)

## 2017-03-29 LAB — POTASSIUM: POTASSIUM: 3.5 mmol/L (ref 3.5–5.1)

## 2017-03-29 LAB — MAGNESIUM: Magnesium: 2.1 mg/dL (ref 1.7–2.4)

## 2017-03-29 MED ORDER — METHYLPREDNISOLONE SODIUM SUCC 40 MG IJ SOLR
40.0000 mg | Freq: Two times a day (BID) | INTRAMUSCULAR | Status: DC
  Administered 2017-03-29 – 2017-03-30 (×3): 40 mg via INTRAVENOUS
  Filled 2017-03-29 (×2): qty 1

## 2017-03-29 NOTE — Progress Notes (Signed)
[place] at [place] NAME: [name]    MR#:  [id number]  DATE OF BIRTH:  Sep 27, 1950  SUBJECTIVE:  Patient doing well this morning. She reports no shortness of breath however still on high flow nasal cannula.  REVIEW OF SYSTEMS:    Review of Systems  Constitutional: Negative for fever, chills weight loss She is deaf HENT: Negative for ear pain, nosebleeds, congestion, facial swelling, rhinorrhea, neck pain, neck stiffness and ear discharge.   Respiratory: Positive For cough and no shortness of breath no wheezing Cardiovascular: Negative for chest pain, palpitations and leg swelling.  Gastrointestinal: Negative for heartburn, abdominal pain, vomiting, diarrhea or consitpation Genitourinary: Negative for dysuria, urgency, frequency, hematuria Musculoskeletal: Negative for back pain or joint pain Neurological: Negative for dizziness, seizures, syncope, focal weakness,  numbness and headaches.  Hematological: Does not bruise/bleed easily.  Psychiatric/Behavioral: Negative for hallucinations, confusion, dysphoric mood    Tolerating Diet: yes      DRUG ALLERGIES:   Allergies  Allergen Reactions  . Aspirin Itching  . Celebrex [Celecoxib] Itching    itching  . Ciprofloxacin Itching  . Codeine Itching  . Fosphenytoin Itching  . Levaquin [Levofloxacin In D5w] Itching  . Levofloxacin Itching  . Lovastatin Itching  . Penicillins     Documentation indicates severe reaction  Pt tolerated cephalosporin without adverse reaction 09/18   . Pravastatin Itching  . Sulfa Antibiotics Itching    VITALS:  Blood pressure (!) 148/65, pulse (!) 56, temperature 97.9 F (36.6 C), temperature source Oral, resp. rate 19, height _0  (1.575 m), weight 72.6 kg (160 lb), SpO2 94 %.  PHYSICAL EXAMINATION:  Constitutional: Appears well-developed and well-nourished. No distress. HENT: Normocephalic. Marland Kitchen Oropharynx is clear and moist.  Eyes:  Conjunctivae and EOM are normal. PERRLA, no scleral icterus.  Neck: Normal ROM. Neck supple. No JVD. No tracheal deviation. CVS: RRR, S1/S2 +, no murmurs, no gallops, no carotid bruit.  Pulmonary: Respiratory effort is normal. She has crackles at the left base with decreased breath sounds expiratory wheezing heard left base  Abdominal: Soft. BS +,  no distension, tenderness, rebound or guarding.  Musculoskeletal: Normal range of motion. No edema and no tenderness.  Neuro: Alert. CN 2-12 grossly intact. No focal deficits. Skin: Skin is warm and dry. No rash noted. Psychiatric: Normal mood and affect.      LABORATORY PANEL:   CBC  Recent Labs Lab 03/28/17 0442  WBC 6.4  HGB 14.2  HCT 41.1  PLT 220   ------------------------------------------------------------------------------------------------------------------  Chemistries   Recent Labs Lab 03/25/17 0301  03/28/17 0442 03/29/17 0452  NA 139  < > 136  --   K 3.1*  < > 3.6 3.5  CL 100*  < > 98*  --   CO2 29  < > 30  --   GLUCOSE 179*  < > 185*  --   BUN 6  < > 9  --   CREATININE 0.45  < > 0.50  --   CALCIUM 8.6*  < > 8.5*  --   MG 1.4*  < > 1.8 2.1  AST 23  --   --   --   ALT 11*  --   --   --   ALKPHOS 94  --   --   --   BILITOT 0.6  --   --   --   < > = values in this interval not displayed. ------------------------------------------------------------------------------------------------------------------  Cardiac Enzymes  Recent  Labs Lab 03/24/17 1738 03/25/17 0301 03/25/17 1012  TROPONINI 0.03* <0.03 <0.03   ------------------------------------------------------------------------------------------------------------------  RADIOLOGY:  No results found.   ASSESSMENT AND PLAN:    66 year old female with a history of deafness, chronic respiratory failure on 2 L of oxygen due to COPD and diabetes who presents with shortness of breath.  1. Acute on chronic hypoxic respiratory failure due to What was  initially thought to be healthcare acquired pneumonia however now appears to be aspiration pneumonia/multifocal pneumonia Due to seizures. Intensivist changed antibiotics to clindamycin I will add IV steroids today for wheezing Try to wean off high flow nasal cannula today to baseline oxygen of 2 L Repeat chest x-ray to evaluate pneumonia.  2. Seizure disorder: Patient has had 2 witnessed seizures in the hospital. Neurology consultation is appreciated. Continue increased dose of Vimpat 150 mg BID  3. Hyperkalemia: This has resolved 4. Essential hypertension: Continue losartan/HCTZ and diltiazem  5. Diabetes: Continue sliding scale and ADA diet Holding glipizide for now  6. Depression: Continue  Celexa 7. Evident neuropathy: Continue gabapentin 8. Restless leg syndrome: Continue Requip   Physical therapy is recommending home with home health upon discharge.   Management plans discussed with the patient and family and she is in agreement.  CODE STATUS: full  TOTAL TIME TAKING CARE OF THIS PATIENT: 24 minutes.     POSSIBLE D/C 2-3 days DEPENDING ON CLINICAL CONDITION.   Merin Borjon M.D on 03/29/2017 at 8:11 AM  Between 7am to 6pm - Pager - [phone] After 6pm go to [email or web address] - password [email or web address] [facility]  Office  [phone]  CC: Primary care physician; Cletis Athens, MD  Note: This dictation was prepared with Dragon dictation along with smaller phrase technology. Any transcriptional errors that result from this process are unintentional.

## 2017-03-29 NOTE — Progress Notes (Signed)
MEDICATION RELATED CONSULT NOTE - INITIAL   Pharmacy Consult for electrolyte monitoring  Labs: Lab Results  Component Value Date   K 3.5 03/29/2017     Recent Labs  03/27/17 0434 03/28/17 0442 03/29/17 0452  WBC  --  6.4  --   HGB  --  14.2  --   HCT  --  41.1  --   PLT  --  220  --   CREATININE 0.47 0.50  --   MG 1.6* 1.8 2.1  PHOS  --  3.6  --    Estimated Creatinine Clearance: 65.4 mL/min (by C-G formula based on SCr of 0.5 mg/dL).   Assessment: Pharmacy consulted to follow and replace electrolytes if needed. Patient is on furosemide 40 mg PO daily.  Goal: K 3.5 -5.1, Mg >2  Plan:  Electrolytes WNL and at goal. No supplementation needed at this time.  Lenis Noon, PharmD, BCPS Clinical Pharmacist 03/29/2017,8:15 AM

## 2017-03-30 LAB — BASIC METABOLIC PANEL
Anion gap: 7 (ref 5–15)
BUN: 16 mg/dL (ref 6–20)
CHLORIDE: 98 mmol/L — AB (ref 101–111)
CO2: 30 mmol/L (ref 22–32)
Calcium: 9.2 mg/dL (ref 8.9–10.3)
Creatinine, Ser: 0.53 mg/dL (ref 0.44–1.00)
GFR calc Af Amer: >60 mL/min (ref >60)
GFR calc non Af Amer: >60 mL/min (ref >60)
GLUCOSE: 192 mg/dL — AB (ref 65–99)
POTASSIUM: 3.7 mmol/L (ref 3.5–5.1)
Sodium: 135 mmol/L (ref 135–145)

## 2017-03-30 LAB — CBC
HEMATOCRIT: 42.8 % (ref 35.0–47.0)
HEMOGLOBIN: 14.6 g/dL (ref 12.0–16.0)
MCH: 28.2 pg (ref 26.0–34.0)
MCHC: 34 g/dL (ref 32.0–36.0)
MCV: 82.7 fL (ref 80.0–100.0)
Platelets: 246 10*3/uL (ref 150–440)
RBC: 5.18 MIL/uL (ref 3.80–5.20)
RDW: 13.7 % (ref 11.5–14.5)
WBC: 8.3 10*3/uL (ref 3.6–11.0)

## 2017-03-30 LAB — GLUCOSE, CAPILLARY
GLUCOSE-CAPILLARY: 192 mg/dL — AB (ref 65–99)
Glucose-Capillary: 155 mg/dL — ABNORMAL HIGH (ref 65–99)

## 2017-03-30 LAB — MAGNESIUM: MAGNESIUM: 1.8 mg/dL (ref 1.7–2.4)

## 2017-03-30 MED ORDER — GUAIFENESIN-DM 100-10 MG/5ML PO SYRP: 5.0000 mL | ORAL_SOLUTION | ORAL | Status: DC | PRN

## 2017-03-30 MED ORDER — GUAIFENESIN-DM 100-10 MG/5ML PO SYRP: 5.0000 mL | ORAL_SOLUTION | Freq: Four times a day (QID) | ORAL | 0 refills | Status: DC | PRN

## 2017-03-30 MED ORDER — MAGNESIUM OXIDE 400 (241.3 MG) MG PO TABS
400.0000 mg | ORAL_TABLET | Freq: Every day | ORAL | Status: DC
  Administered 2017-03-30: 400 mg via ORAL
  Filled 2017-03-30: qty 1

## 2017-03-30 MED ORDER — CLINDAMYCIN HCL 300 MG PO CAPS: 600.0000 mg | ORAL_CAPSULE | Freq: Three times a day (TID) | ORAL | 0 refills | Status: DC

## 2017-03-30 MED ORDER — CLINDAMYCIN HCL 150 MG PO CAPS
600.0000 mg | ORAL_CAPSULE | Freq: Three times a day (TID) | ORAL | Status: DC
  Administered 2017-03-30: 600 mg via ORAL
  Filled 2017-03-30: qty 2
  Filled 2017-03-30: qty 4

## 2017-03-30 MED ORDER — MAGNESIUM SULFATE 2 GM/50ML IV SOLN
2.0000 g | Freq: Once | INTRAVENOUS | Status: AC
  Administered 2017-03-30: 2 g via INTRAVENOUS
  Filled 2017-03-30: qty 50

## 2017-03-30 MED ORDER — LACOSAMIDE 150 MG PO TABS: 150.0000 mg | ORAL_TABLET | Freq: Two times a day (BID) | ORAL | 0 refills | Status: DC

## 2017-03-30 NOTE — Discharge Summary (Signed)
[place] at [place] NAME: [name]    MR#:  [id number]  DATE OF BIRTH:  Oct 06, 1950  DATE OF ADMISSION:  03/24/2017   ADMITTING PHYSICIAN: Harvie Bridge, DO  DATE OF DISCHARGE: 03/30/2017 PRIMARY CARE PHYSICIAN: Cletis Athens, MD   ADMISSION DIAGNOSIS:  Hypokalemia [E87.6] Hypoxia [R09.02] Community acquired pneumonia of left lower lobe of lung ([place]) [J18.1] Acute on chronic respiratory failure with hypoxia ([place]) [J96.21] DISCHARGE DIAGNOSIS:  Active Problems:   Acute on chronic respiratory failure with hypoxia ([place])   Community acquired pneumonia of left lower lobe of lung ([place])   Hypoxia  SECONDARY DIAGNOSIS:   Past Medical History:  Diagnosis Date  . Asthma   . Cirrhosis, non-alcoholic ([place])   . COPD (chronic obstructive pulmonary disease) ([place])   . Deaf   . Depression   . Diabetes mellitus without complication ([place])   . GERD (gastroesophageal reflux disease)   . Heart murmur   . Hepatitis   . Hypertension   . Lymph node disorder    arm  . Neuropathy   . On home oxygen therapy    hs  . Orthopnea   . RLS (restless legs syndrome)   . Seizures ([place])   . Shortness of breath dyspnea   . Sleep apnea   . Stroke [facility])    [place] COURSE:   66 year old female with a history of deafness, chronic respiratory failure on 2 L of oxygen due to COPD and diabetes who presents with shortness of breath.  1. Acute on chronic hypoxic respiratory failure due to What was initially thought to be healthcare acquired pneumonia however now appears to be aspiration pneumonia/multifocal pneumonia Due to seizures. Intensivist changed antibiotics to clindamycin Discontinue IV steroids (added yesterday for wheezing) Wean off high flow nasal cannula to baseline oxygen of 3 L  2. Seizure disorder: Patient has had 2 witnessed seizures in the hospital. Neurology consultation is appreciated. Continue increased dose of Vimpat 150  mg BID, continue per Dr. Doy Mince.  3. Hyperkalemia: This has resolved 4. Essential hypertension: Continue losartan/HCTZ and diltiazem  5. Diabetes: Continue sliding scale and ADA diet Resume glipizide.  6. Depression: Continue  Celexa 7. Evident neuropathy: Continue gabapentin 8. Restless leg syndrome: Continue Requip  Physical therapy is recommending home with home health upon discharge.  I discussed with Dr. Doy Mince.  DISCHARGE CONDITIONS:  Stable, discharge to home with home health and PT today. CONSULTS OBTAINED:  Treatment Team:  Alexis Goodell, MD DRUG ALLERGIES:   Allergies  Allergen Reactions  . Aspirin Itching  . Celebrex [Celecoxib] Itching    itching  . Ciprofloxacin Itching  . Codeine Itching  . Fosphenytoin Itching  . Levaquin [Levofloxacin In D5w] Itching  . Levofloxacin Itching  . Lovastatin Itching  . Penicillins     Documentation indicates severe reaction  Pt tolerated cephalosporin without adverse reaction 09/18   . Pravastatin Itching  . Sulfa Antibiotics Itching   DISCHARGE MEDICATIONS:   Allergies as of 03/30/2017      Reactions   Aspirin Itching   Celebrex [celecoxib] Itching   itching   Ciprofloxacin Itching   Codeine Itching   Fosphenytoin Itching   Levaquin [levofloxacin In D5w] Itching   Levofloxacin Itching   Lovastatin Itching   Penicillins    Documentation indicates severe reaction Pt tolerated cephalosporin without adverse reaction 09/18   Pravastatin Itching   Sulfa Antibiotics Itching      Medication List  TAKE these medications   albuterol 108 (90 Base) MCG/ACT inhaler Commonly known as:  PROVENTIL HFA;VENTOLIN HFA Inhale 2 puffs into the lungs every 4 (four) hours as needed for wheezing or shortness of breath.   albuterol (2.5 MG/3ML) 0.083% nebulizer solution Commonly known as:  PROVENTIL Take 3 mLs (2.5 mg total) by nebulization every 4 (four) hours as needed for wheezing or shortness of breath.     citalopram 20 MG tablet Commonly known as:  CELEXA Take 1 tablet (20 mg total) by mouth daily. What changed:  when to take this   clindamycin 300 MG capsule Commonly known as:  CLEOCIN Take 2 capsules (600 mg total) by mouth every 8 (eight) hours.   clopidogrel 75 MG tablet Commonly known as:  PLAVIX Take 1 tablet (75 mg total) by mouth daily.   diltiazem 180 MG 24 hr capsule Commonly known as:  DILACOR XR Take 180 mg by mouth daily.   EPINEPHrine 0.3 mg/0.3 mL Soaj injection Commonly known as:  EPI-PEN Inject into the muscle once.   furosemide 40 MG tablet Commonly known as:  LASIX Take 1 tablet (40 mg total) by mouth daily.   gabapentin 300 MG capsule Commonly known as:  NEURONTIN Take 1 capsule by mouth 2 (two) times daily.   GLIPIZIDE XL 5 MG 24 hr tablet Generic drug:  glipiZIDE Take 2 tablets (10 mg total) by mouth daily. What changed:  how much to take   guaiFENesin-dextromethorphan 100-10 MG/5ML syrup Commonly known as:  ROBITUSSIN DM Take 5 mLs by mouth every 6 (six) hours as needed for cough.   Lacosamide 150 MG Tabs Take 1 tablet (150 mg total) by mouth 2 (two) times daily. What changed:  medication strength  how much to take   losartan-hydrochlorothiazide 100-12.5 MG tablet Commonly known as:  HYZAAR Take 1 tablet by mouth daily.   mometasone-formoterol 200-5 MCG/ACT Aero Commonly known as:  DULERA Inhale 2 puffs into the lungs 2 (two) times daily.   montelukast 10 MG tablet Commonly known as:  SINGULAIR Take 1 tablet by mouth at bedtime.   omeprazole 20 MG capsule Commonly known as:  PRILOSEC Take 20 mg by mouth daily.   rOPINIRole 0.25 MG tablet Commonly known as:  REQUIP Take 1 tablet (0.25 mg total) by mouth 3 (three) times daily.            Discharge Care Instructions        Start     Ordered   03/30/17 0000  Increase activity slowly     03/30/17 1035   03/30/17 0000  Diet - low sodium heart healthy     03/30/17 1035    03/30/17 0000  lacosamide 150 MG TABS  2 times daily     03/30/17 1040   03/30/17 0000  clindamycin (CLEOCIN) 300 MG capsule  Every 8 hours     03/30/17 1040   03/30/17 0000  guaiFENesin-dextromethorphan (ROBITUSSIN DM) 100-10 MG/5ML syrup  Every 6 hours PRN     03/30/17 1040   03/30/17 0000  Increase activity slowly     03/30/17 1402   03/30/17 0000  Diet - low sodium heart healthy     03/30/17 1402       DISCHARGE INSTRUCTIONS:  See AVS. If you experience worsening of your admission symptoms, develop shortness of breath, life threatening emergency, suicidal or homicidal thoughts you must seek medical attention immediately by calling 911 or calling your MD immediately  if symptoms less severe.  You Must  read complete instructions/literature along with all the possible adverse reactions/side effects for all the Medicines you take and that have been prescribed to you. Take any new Medicines after you have completely understood and accpet all the possible adverse reactions/side effects.   Please note  You were cared for by a hospitalist during your hospital stay. If you have any questions about your discharge medications or the care you received while you were in the hospital after you are discharged, you can call the unit and asked to speak with the hospitalist on call if the hospitalist that took care of you is not available. Once you are discharged, your primary care physician will handle any further medical issues. Please note that NO REFILLS for any discharge medications will be authorized once you are discharged, as it is imperative that you return to your primary care physician (or establish a relationship with a primary care physician if you do not have one) for your aftercare needs so that they can reassess your need for medications and monitor your lab values.    On the day of Discharge:  VITAL SIGNS:  Blood pressure (!) 129/54, pulse (!) 58, temperature 98.2 F (36.8 C),  temperature source Oral, resp. rate 20, height _0  (1.575 m), weight 160 lb (72.6 kg), SpO2 95 %. PHYSICAL EXAMINATION:  GENERAL:  66 y.o.-year-old patient lying in the bed with no acute distress.  EYES: Pupils equal, round, reactive to light and accommodation. No scleral icterus. Extraocular muscles intact.  HEENT: Head atraumatic, normocephalic. Oropharynx and nasopharynx clear.  NECK:  Supple, no jugular venous distention. No thyroid enlargement, no tenderness.  LUNGS: Normal breath sounds bilaterally, no wheezing, rales,rhonchi or crepitation. No use of accessory muscles of respiration.  CARDIOVASCULAR: S1, S2 normal. No murmurs, rubs, or gallops.  ABDOMEN: Soft, non-tender, non-distended. Bowel sounds present. No organomegaly or mass.  EXTREMITIES: No pedal edema, cyanosis, or clubbing.  NEUROLOGIC: Cranial nerves II through XII are intact. Muscle strength 4/5 in all extremities. Sensation intact. Gait not checked.  PSYCHIATRIC: The patient is alert and oriented x 3.  SKIN: No obvious rash, lesion, or ulcer.  DATA REVIEW:   CBC  Recent Labs Lab 03/30/17 0413  WBC 8.3  HGB 14.6  HCT 42.8  PLT 246    Chemistries   Recent Labs Lab 03/25/17 0301  03/30/17 0413  NA 139  < > 135  K 3.1*  < > 3.7  CL 100*  < > 98*  CO2 29  < > 30  GLUCOSE 179*  < > 192*  BUN 6  < > 16  CREATININE 0.45  < > 0.53  CALCIUM 8.6*  < > 9.2  MG 1.4*  < > 1.8  AST 23  --   --   ALT 11*  --   --   ALKPHOS 94  --   --   BILITOT 0.6  --   --   < > = values in this interval not displayed.   Microbiology Results  Results for orders placed or performed during the hospital encounter of 03/24/17  Blood culture (routine x 2)     Status: None   Collection Time: 03/24/17  7:10 PM  Result Value Ref Range Status   Specimen Description BLOOD BLOOD LEFT FOREARM BLOOD RIGHT FOREARM  Final   Special Requests   Final    BOTTLES DRAWN AEROBIC AND ANAEROBIC Blood Culture adequate volume   Culture NO  GROWTH 5 DAYS  Final   Report  Status 03/29/2017 FINAL  Final  Blood culture (routine x 2)     Status: None   Collection Time: 03/24/17  7:11 PM  Result Value Ref Range Status   Specimen Description BLOOD BLOOD LEFT FOREARM  Final   Special Requests   Final    BOTTLES DRAWN AEROBIC AND ANAEROBIC Blood Culture adequate volume   Culture NO GROWTH 5 DAYS  Final   Report Status 03/29/2017 FINAL  Final  MRSA PCR Screening     Status: None   Collection Time: 03/25/17  2:44 AM  Result Value Ref Range Status   MRSA by PCR NEGATIVE NEGATIVE Final    Comment:        The GeneXpert MRSA Assay (FDA approved for NASAL specimens only), is one component of a comprehensive MRSA colonization surveillance program. It is not intended to diagnose MRSA infection nor to guide or monitor treatment for MRSA infections.     RADIOLOGY:  No results found.   Management plans discussed with the patient, family and they are in agreement.  CODE STATUS: Full Code   TOTAL TIME TAKING CARE OF THIS PATIENT: 35 minutes.    Demetrios Loll M.D on 03/30/2017 at 2:03 PM  Between 7am to 6pm - Pager - [phone]  After 6pm go to [email or web address] - Proofreader  [facility] [place] [facility]  Office  [phone]  CC: Primary care physician; Cletis Athens, MD   Note: This dictation was prepared with Dragon dictation along with smaller phrase technology. Any transcriptional errors that result from this process are unintentional.

## 2017-03-30 NOTE — Care Management (Signed)
Patient admitted for respiratory failure.  Patient lives at home alone.  Adult daughter lives locally for support.  Patient has chronic home O2 through [facility].  PT has assessed patient and recommeneds home health PT. Patient is agreeable to services. Patient has had [facility] in the past and would like to use them again.  Patient approved by Dr. Doy Hutching for [facility].  Referral made to [facility] with [facility].  Patient does not have portable tank her for transport home.  Corene Cornea from [facility] to deliver tank prior to discharge.  Bedside RN to wean patient to home setting and ambulate to determine if a higher liter flow is needed at discharge.

## 2017-03-30 NOTE — Progress Notes (Signed)
Pt discharged via MD order. Pt discharged on 3L of O2. Discharge paperwork reviewed with pt. Prescriptions given to pt. Questions answered to pt satisfaction. IV removed. Pt taken to car in wheelchair.

## 2017-03-30 NOTE — Care Management Important Message (Signed)
Important Message  Patient Details  Name: [name] MRN: [id number] Date of Birth: [birth year]   Medicare Important Message Given:  Yes    Beverly Sessions, RN 03/30/2017, 2:54 PM

## 2017-03-30 NOTE — Progress Notes (Signed)
MEDICATION RELATED CONSULT NOTE - INITIAL   Pharmacy Consult for electrolyte monitoring  Labs: Lab Results  Component Value Date   K 3.7 03/30/2017     Recent Labs  03/28/17 0442 03/29/17 0452 03/30/17 0413  WBC 6.4  --  8.3  HGB 14.2  --  14.6  HCT 41.1  --  42.8  PLT 220  --  246  CREATININE 0.50  --  0.53  MG 1.8 2.1 1.8  PHOS 3.6  --   --    Estimated Creatinine Clearance: 65.4 mL/min (by C-G formula based on SCr of 0.53 mg/dL).   Assessment: Pharmacy consulted to follow and replace electrolytes if needed. Patient is on furosemide 40 mg PO daily. Patient has a history of seizures so will target Mg > 2.  Goal: K 3.5 -5.1, Mg >2  Plan:  K = 3.7 WNL.  Mg = 1.8  Will start magnesium oxide 400 mg PO daily and given magnesium sulfate 2 g IV dose once.  Lenis Noon, PharmD, BCPS Clinical Pharmacist 03/30/2017,10:13 AM

## 2017-03-30 NOTE — Care Management Note (Signed)
Case Management Note  Patient Details  Name: LOXLEY CIBRIAN MRN: [id number] Date of Birth: 05/01/1951    Patient to discharge with order for 3 liters continuous   Subjective/Objective:                    Action/Plan:   Expected Discharge Date:  03/30/17               Expected Discharge Plan:  [place]  In-House Referral:     Discharge planning Services  CM Consult  Post Acute Care Choice:  Home Health Choice offered to:  Patient  DME Arranged:    DME Agency:     HH Arranged:  RN, PT [place] Agency:  [facility]  Status of Service:  Completed, signed off  If discussed at [place] of Stay Meetings, dates discussed:    Additional Comments:  Beverly Sessions, RN 03/30/2017, 2:53 PM

## 2017-03-30 NOTE — Discharge Instructions (Signed)
Continue home O2 [place] 3-4 L HHPT

## 2017-03-30 NOTE — Progress Notes (Signed)
SATURATION QUALIFICATIONS: (This note is used to comply with regulatory documentation for home oxygen)  Patient Saturations on 2 Liters of oxygen while Ambulating =82%  Patient Saturations on 3 Liters of oxygen while Ambulating =88%  Please briefly explain why patient needs home oxygen: Pts O2 Sat was 92% on 2L while sitting in bed. Once pt stood O2 sat never came above 84% while on 2L. Pt reports at home that she uses anywhere between 2-4L.

## 2017-03-31 DIAGNOSIS — G40909 Epilepsy, unspecified, not intractable, without status epilepticus: Secondary | ICD-10-CM | POA: Diagnosis not present

## 2017-03-31 DIAGNOSIS — F329 Major depressive disorder, single episode, unspecified: Secondary | ICD-10-CM | POA: Diagnosis not present

## 2017-03-31 DIAGNOSIS — K759 Inflammatory liver disease, unspecified: Secondary | ICD-10-CM | POA: Diagnosis not present

## 2017-03-31 DIAGNOSIS — E1140 Type 2 diabetes mellitus with diabetic neuropathy, unspecified: Secondary | ICD-10-CM | POA: Diagnosis not present

## 2017-03-31 DIAGNOSIS — J449 Chronic obstructive pulmonary disease, unspecified: Secondary | ICD-10-CM | POA: Diagnosis not present

## 2017-03-31 DIAGNOSIS — H9193 Unspecified hearing loss, bilateral: Secondary | ICD-10-CM | POA: Diagnosis not present

## 2017-03-31 DIAGNOSIS — K7460 Unspecified cirrhosis of liver: Secondary | ICD-10-CM | POA: Diagnosis not present

## 2017-03-31 DIAGNOSIS — J9621 Acute and chronic respiratory failure with hypoxia: Secondary | ICD-10-CM | POA: Diagnosis not present

## 2017-03-31 DIAGNOSIS — I10 Essential (primary) hypertension: Secondary | ICD-10-CM | POA: Diagnosis not present

## 2017-04-01 DIAGNOSIS — E1140 Type 2 diabetes mellitus with diabetic neuropathy, unspecified: Secondary | ICD-10-CM | POA: Diagnosis not present

## 2017-04-01 DIAGNOSIS — K759 Inflammatory liver disease, unspecified: Secondary | ICD-10-CM | POA: Diagnosis not present

## 2017-04-01 DIAGNOSIS — H9193 Unspecified hearing loss, bilateral: Secondary | ICD-10-CM | POA: Diagnosis not present

## 2017-04-01 DIAGNOSIS — J9621 Acute and chronic respiratory failure with hypoxia: Secondary | ICD-10-CM | POA: Diagnosis not present

## 2017-04-01 DIAGNOSIS — F329 Major depressive disorder, single episode, unspecified: Secondary | ICD-10-CM | POA: Diagnosis not present

## 2017-04-01 DIAGNOSIS — K7460 Unspecified cirrhosis of liver: Secondary | ICD-10-CM | POA: Diagnosis not present

## 2017-04-01 DIAGNOSIS — J449 Chronic obstructive pulmonary disease, unspecified: Secondary | ICD-10-CM | POA: Diagnosis not present

## 2017-04-01 DIAGNOSIS — I10 Essential (primary) hypertension: Secondary | ICD-10-CM | POA: Diagnosis not present

## 2017-04-01 DIAGNOSIS — G40909 Epilepsy, unspecified, not intractable, without status epilepticus: Secondary | ICD-10-CM | POA: Diagnosis not present

## 2017-04-02 DIAGNOSIS — G40909 Epilepsy, unspecified, not intractable, without status epilepticus: Secondary | ICD-10-CM | POA: Diagnosis not present

## 2017-04-02 DIAGNOSIS — K7460 Unspecified cirrhosis of liver: Secondary | ICD-10-CM | POA: Diagnosis not present

## 2017-04-02 DIAGNOSIS — F329 Major depressive disorder, single episode, unspecified: Secondary | ICD-10-CM | POA: Diagnosis not present

## 2017-04-02 DIAGNOSIS — I10 Essential (primary) hypertension: Secondary | ICD-10-CM | POA: Diagnosis not present

## 2017-04-02 DIAGNOSIS — H9193 Unspecified hearing loss, bilateral: Secondary | ICD-10-CM | POA: Diagnosis not present

## 2017-04-02 DIAGNOSIS — K759 Inflammatory liver disease, unspecified: Secondary | ICD-10-CM | POA: Diagnosis not present

## 2017-04-02 DIAGNOSIS — E1140 Type 2 diabetes mellitus with diabetic neuropathy, unspecified: Secondary | ICD-10-CM | POA: Diagnosis not present

## 2017-04-02 DIAGNOSIS — J449 Chronic obstructive pulmonary disease, unspecified: Secondary | ICD-10-CM | POA: Diagnosis not present

## 2017-04-02 DIAGNOSIS — J9621 Acute and chronic respiratory failure with hypoxia: Secondary | ICD-10-CM | POA: Diagnosis not present

## 2017-04-03 DIAGNOSIS — R000 Tachycardia, unspecified: Secondary | ICD-10-CM | POA: Diagnosis not present

## 2017-04-03 DIAGNOSIS — J449 Chronic obstructive pulmonary disease, unspecified: Secondary | ICD-10-CM | POA: Diagnosis not present

## 2017-04-03 DIAGNOSIS — I10 Essential (primary) hypertension: Secondary | ICD-10-CM | POA: Diagnosis not present

## 2017-04-05 ENCOUNTER — Emergency Department: Payer: Medicare HMO

## 2017-04-05 ENCOUNTER — Encounter: Payer: Self-pay | Admitting: Emergency Medicine

## 2017-04-05 ENCOUNTER — Emergency Department
Admission: EM | Admit: 2017-04-05 | Discharge: 2017-04-05 | Disposition: A | Discharge status: Home / Self Care | Payer: Medicare HMO | Attending: Emergency Medicine | Admitting: Emergency Medicine

## 2017-04-05 DIAGNOSIS — J45909 Unspecified asthma, uncomplicated: Secondary | ICD-10-CM | POA: Insufficient documentation

## 2017-04-05 DIAGNOSIS — E1140 Type 2 diabetes mellitus with diabetic neuropathy, unspecified: Secondary | ICD-10-CM | POA: Diagnosis not present

## 2017-04-05 DIAGNOSIS — Z7984 Long term (current) use of oral hypoglycemic drugs: Secondary | ICD-10-CM | POA: Insufficient documentation

## 2017-04-05 DIAGNOSIS — I10 Essential (primary) hypertension: Secondary | ICD-10-CM | POA: Insufficient documentation

## 2017-04-05 DIAGNOSIS — J449 Chronic obstructive pulmonary disease, unspecified: Secondary | ICD-10-CM | POA: Insufficient documentation

## 2017-04-05 DIAGNOSIS — I700 Atherosclerosis of aorta: Secondary | ICD-10-CM | POA: Diagnosis not present

## 2017-04-05 DIAGNOSIS — Z7902 Long term (current) use of antithrombotics/antiplatelets: Secondary | ICD-10-CM | POA: Insufficient documentation

## 2017-04-05 DIAGNOSIS — F17210 Nicotine dependence, cigarettes, uncomplicated: Secondary | ICD-10-CM | POA: Diagnosis not present

## 2017-04-05 DIAGNOSIS — R42 Dizziness and giddiness: Secondary | ICD-10-CM

## 2017-04-05 DIAGNOSIS — R05 Cough: Secondary | ICD-10-CM | POA: Insufficient documentation

## 2017-04-05 DIAGNOSIS — Z79899 Other long term (current) drug therapy: Secondary | ICD-10-CM | POA: Insufficient documentation

## 2017-04-05 LAB — CBC WITH DIFFERENTIAL/PLATELET
BASOS PCT: 0 %
Basophils Absolute: 0 10*3/uL (ref 0–0.1)
EOS ABS: 0.1 10*3/uL (ref 0–0.7)
EOS PCT: 1 %
HCT: 42.8 % (ref 35.0–47.0)
Hemoglobin: 15 g/dL (ref 12.0–16.0)
Lymphocytes Relative: 28 %
Lymphs Abs: 3.2 10*3/uL (ref 1.0–3.6)
MCH: 28.8 pg (ref 26.0–34.0)
MCHC: 35.1 g/dL (ref 32.0–36.0)
MCV: 82 fL (ref 80.0–100.0)
MONO ABS: 1 10*3/uL — AB (ref 0.2–0.9)
MONOS PCT: 8 %
NEUTROS PCT: 63 %
Neutro Abs: 7.2 10*3/uL — ABNORMAL HIGH (ref 1.4–6.5)
PLATELETS: 302 10*3/uL (ref 150–440)
RBC: 5.21 MIL/uL — ABNORMAL HIGH (ref 3.80–5.20)
RDW: 13.5 % (ref 11.5–14.5)
WBC: 11.5 10*3/uL — ABNORMAL HIGH (ref 3.6–11.0)

## 2017-04-05 LAB — COMPREHENSIVE METABOLIC PANEL
ALK PHOS: 111 U/L (ref 38–126)
ALT: 19 U/L (ref 14–54)
AST: 21 U/L (ref 15–41)
Albumin: 3.6 g/dL (ref 3.5–5.0)
Anion gap: 12 (ref 5–15)
BUN: 22 mg/dL — AB (ref 6–20)
CALCIUM: 8.8 mg/dL — AB (ref 8.9–10.3)
CO2: 29 mmol/L (ref 22–32)
CREATININE: 1.27 mg/dL — AB (ref 0.44–1.00)
Chloride: 95 mmol/L — ABNORMAL LOW (ref 101–111)
GFR calc non Af Amer: 43 mL/min — ABNORMAL LOW (ref >60)
GFR, EST AFRICAN AMERICAN: 50 mL/min — AB (ref >60)
GLUCOSE: 172 mg/dL — AB (ref 65–99)
Potassium: 2.1 mmol/L — CL (ref 3.5–5.1)
SODIUM: 136 mmol/L (ref 135–145)
Total Bilirubin: 0.7 mg/dL (ref 0.3–1.2)
Total Protein: 7 g/dL (ref 6.5–8.1)

## 2017-04-05 LAB — URINALYSIS, COMPLETE (UACMP) WITH MICROSCOPIC
BACTERIA UA: NONE SEEN
Bilirubin Urine: NEGATIVE
Glucose, UA: NEGATIVE mg/dL
HGB URINE DIPSTICK: NEGATIVE
Ketones, ur: NEGATIVE mg/dL
Leukocytes, UA: NEGATIVE
NITRITE: NEGATIVE
Protein, ur: NEGATIVE mg/dL
RBC / HPF: NONE SEEN RBC/hpf (ref 0–5)
SPECIFIC GRAVITY, URINE: 1.008 (ref 1.005–1.030)
pH: 5 (ref 5.0–8.0)

## 2017-04-05 LAB — TROPONIN I

## 2017-04-05 LAB — MAGNESIUM: MAGNESIUM: 1.6 mg/dL — AB (ref 1.7–2.4)

## 2017-04-05 MED ORDER — POTASSIUM CHLORIDE 20 MEQ PO PACK
PACK | ORAL | Status: AC
  Administered 2017-04-05: 40 meq via ORAL
  Filled 2017-04-05: qty 2

## 2017-04-05 MED ORDER — POTASSIUM CHLORIDE 20 MEQ PO PACK
40.0000 meq | PACK | Freq: Two times a day (BID) | ORAL | Status: DC
  Administered 2017-04-05: 40 meq via ORAL

## 2017-04-05 MED ORDER — MAGNESIUM SULFATE IN D5W 1-5 GM/100ML-% IV SOLN
1.0000 g | Freq: Once | INTRAVENOUS | Status: AC
  Administered 2017-04-05: 1 g via INTRAVENOUS
  Filled 2017-04-05: qty 100

## 2017-04-05 MED ORDER — SODIUM CHLORIDE 0.9 % IV SOLN
Freq: Once | INTRAVENOUS | Status: AC
  Administered 2017-04-05: 20:00:00 via INTRAVENOUS

## 2017-04-05 MED ORDER — POTASSIUM CHLORIDE 20 MEQ/15ML (10%) PO SOLN
40.0000 meq | Freq: Once | ORAL | Status: AC
  Administered 2017-04-05: 40 meq via ORAL
  Filled 2017-04-05: qty 30

## 2017-04-05 MED ORDER — IOPAMIDOL (ISOVUE-370) INJECTION 76%
60.0000 mL | Freq: Once | INTRAVENOUS | Status: AC | PRN
  Administered 2017-04-05: 60 mL via INTRAVENOUS

## 2017-04-05 MED ORDER — POTASSIUM CHLORIDE 20 MEQ/15ML (10%) PO SOLN
40.0000 meq | Freq: Once | ORAL | Status: DC
  Filled 2017-04-05: qty 30

## 2017-04-05 NOTE — ED Notes (Signed)
Called pt's son [name], per pt request.

## 2017-04-05 NOTE — Discharge Instructions (Signed)
Please follow-up with your regular doctor. Please return to the emergency room if you feel worse again or have any other problems.

## 2017-04-05 NOTE — ED Notes (Signed)
[facility] interpreter has arrived; Dr. Ellwood Dense at bedside.  Pt states she is feeling lightheaded. This started 2 days after she came home from hospital. She denies n/v; states "I feel fine." She states she has some pain in her left ear. Dr. Cinda Quest states she has some wax in her left ear, eardrum is visible, and looks to be WNL.  Pt reports lightheadedness is more severe when she stands up. Diarrhea x 1 today. States she has productive cough "sometimes" and the phlegm is white when she has it.  She was seen by her pcp, who detected a wheeze.

## 2017-04-05 NOTE — ED Triage Notes (Signed)
Pt to ED via [facility] from home for dizziness and cough. Pt was recently discharged from the hospital. While pt was here her seizure medication was changed. Pt reports that since that time she has been feeling dizzy. Pt also c/o cough.

## 2017-04-05 NOTE — ED Provider Notes (Signed)
[facility] Emergency Department Provider Note   ____________________________________________   First MD Initiated Contact with Patient 04/05/17 1716     (approximate)  I have reviewed the triage vital signs and the nursing notes.   HISTORY  Chief Complaint Dizziness    HPI [name] is a 66 y.o. female who complains of lightheadedness. She was in the hospital for pneumonia one home and then she was irrigating her ears TMs wax out has some pain in the left ear but complains of lightheadedness when she moves or stands up. She had diarrhea once today she had a productive cough which brings up some white phlegm and some wheezing which is been present after the pneumonia.   review of the old records show that on the 23rd of this month she had a chest x-ray that showed increasing infiltrate and effusion and recommended chest CT this has not been done so we will do it today.   Past Medical History:  Diagnosis Date  . Asthma   . Cirrhosis, non-alcoholic ([place])   . COPD (chronic obstructive pulmonary disease) ([place])   . Deaf   . Depression   . Diabetes mellitus without complication ([place])   . GERD (gastroesophageal reflux disease)   . Heart murmur   . Hepatitis   . Hypertension   . Lymph node disorder    arm  . Neuropathy   . On home oxygen therapy    hs  . Orthopnea   . RLS (restless legs syndrome)   . Seizures ([place])   . Shortness of breath dyspnea   . Sleep apnea   . Stroke [facility])    tia    Patient Active Problem List   Diagnosis Date Noted  . Community acquired pneumonia of left lower lobe of lung ([place])   . Hypoxia   . Acute on chronic respiratory failure with hypoxia ([place]) 03/24/2017  . Hypokalemia 02/12/2017  . Sepsis ([place]) 02/11/2017  . GERD (gastroesophageal reflux disease) 03/23/2016  . Depression 03/23/2016  . Convulsions/seizures ([place]) 03/22/2016  . DM (diabetes mellitus), type 2 ([place]) 03/22/2016  . Essential hypertension  03/22/2016  . Neuropathy 03/22/2016  . Convulsions ([place]) 03/22/2016  . COPD exacerbation ([place]) 03/31/2015  . Acute respiratory failure with hypoxia ([place]) 03/29/2015    Past Surgical History:  Procedure Laterality Date  . CATARACT EXTRACTION W/PHACO Right 11/23/2014   Procedure: CATARACT EXTRACTION PHACO AND INTRAOCULAR LENS PLACEMENT (IOC);  Surgeon: Lyla Glassing, MD;  Location: [facility] ORS;  Service: Ophthalmology;  Laterality: Right;  . CATARACT EXTRACTION W/PHACO Left 12/14/2014   Procedure: CATARACT EXTRACTION PHACO AND INTRAOCULAR LENS PLACEMENT (IOC);  Surgeon: Lyla Glassing, MD;  Location: [facility] ORS;  Service: Ophthalmology;  Laterality: Left;  US:01:16.6 AP:15.8 CDE:12.14  . CESAREAN SECTION    . CHOLECYSTECTOMY    . KNEE ARTHROPLASTY    . THUMB ARTHROSCOPY    . TONSILLECTOMY    . TYMPANOPLASTY     muliple    Prior to Admission medications   Medication Sig Start Date End Date Taking? Authorizing Provider  albuterol (PROVENTIL HFA;VENTOLIN HFA) 108 (90 BASE) MCG/ACT inhaler Inhale 2 puffs into the lungs every 4 (four) hours as needed for wheezing or shortness of breath.   Yes [provider]  albuterol (PROVENTIL) (2.5 MG/3ML) 0.083% nebulizer solution Take 3 mLs (2.5 mg total) by nebulization every 4 (four) hours as needed for wheezing or shortness of breath. 07/17/16  Yes Vaughan Basta, MD  citalopram (CELEXA) 20 MG tablet Take  1 tablet (20 mg total) by mouth daily. Patient taking differently: Take 20 mg by mouth at bedtime.  07/17/16  Yes Vaughan Basta, MD  clindamycin (CLEOCIN) 300 MG capsule Take 2 capsules (600 mg total) by mouth every 8 (eight) hours. 03/30/17  Yes Demetrios Loll, MD  clopidogrel (PLAVIX) 75 MG tablet Take 1 tablet (75 mg total) by mouth daily. 07/17/16  Yes Vaughan Basta, MD  diltiazem (DILACOR XR) 180 MG 24 hr capsule Take 180 mg by mouth daily.   Yes [provider]  EPINEPHrine 0.3 mg/0.3 mL IJ SOAJ injection  Inject into the muscle once.   Yes [provider]  furosemide (LASIX) 40 MG tablet Take 1 tablet (40 mg total) by mouth daily. 07/17/16  Yes Vaughan Basta, MD  gabapentin (NEURONTIN) 300 MG capsule Take 1 capsule by mouth 2 (two) times daily.  01/16/16  Yes [provider]  GLIPIZIDE XL 5 MG 24 hr tablet Take 2 tablets (10 mg total) by mouth daily. Patient taking differently: Take 5 mg by mouth daily.  02/12/17  Yes Max Sane, MD  guaiFENesin-dextromethorphan (ROBITUSSIN DM) 100-10 MG/5ML syrup Take 5 mLs by mouth every 6 (six) hours as needed for cough. 03/30/17  Yes Demetrios Loll, MD  lacosamide 150 MG TABS Take 1 tablet (150 mg total) by mouth 2 (two) times daily. 03/30/17  Yes Demetrios Loll, MD  losartan-hydrochlorothiazide [facility]) 100-12.5 MG tablet Take 1 tablet by mouth daily.   Yes [provider]  mometasone-formoterol (DULERA) 200-5 MCG/ACT AERO Inhale 2 puffs into the lungs 2 (two) times daily. 07/17/16  Yes Vaughan Basta, MD  montelukast (SINGULAIR) 10 MG tablet Take 1 tablet by mouth at bedtime. 03/18/16  Yes [provider]  omeprazole (PRILOSEC) 20 MG capsule Take 20 mg by mouth daily.   Yes [provider]  rOPINIRole (REQUIP) 0.25 MG tablet Take 1 tablet (0.25 mg total) by mouth 3 (three) times daily. 03/24/16  Yes Oswald Hillock, MD    Allergies Aspirin; Celebrex [celecoxib]; Ciprofloxacin; Codeine; Fosphenytoin; Levaquin [levofloxacin in d5w]; Levofloxacin; Lovastatin; Penicillins; Pravastatin; and Sulfa antibiotics  Family History  Problem Relation Age of Onset  . Lung cancer Mother   . CAD Father     Social History Social History  Substance Use Topics  . Smoking status: Current Every Day Smoker    Packs/day: 0.50  . Smokeless tobacco: Never Used  . Alcohol use No    Review of Systems  Constitutional: No fever/chills Eyes: No visual changes. ENT: No sore throat. Cardiovascular: Denies chest pain. Respiratory:  Denies shortness of breath. Gastrointestinal: No abdominal pain.  No nausea, no vomiting.  No diarrhea.  No constipation. Genitourinary: Negative for dysuria. Musculoskeletal: Negative for back pain. Skin: Negative for rash. Neurological: Negative for headaches, focal weakness   ____________________________________________   PHYSICAL EXAM:  VITAL SIGNS: ED Triage Vitals [04/05/17 1712]  Enc Vitals Group     BP      Pulse Rate (!) 55     Resp 16     Temp 98.3 F (36.8 C)     Temp Source Oral     SpO2 94 %     Weight      Height      Head Circumference      Peak Flow      Pain Score      Pain Loc      Pain Edu?      Excl. in [place]?     Constitutional: Alert and oriented.  Well appearing and in no acute distress.history obtained through sign language interpreter Eyes: Conjunctivae are normal.  Head: Atraumatic. Nose: No congestion/rhinnorheaears ear canals are mostly obscured by wax which is fairly deep and left ear canal has a hole through the wax wreck and see the TM the TM looks to be intact and pearly gray Mouth/Throat: Mucous membranes are moist.  Oropharynx non-erythematous. Neck: No stridor. Cardiovascular: Normal rate, regular rhythm. Grossly normal heart sounds.  Good peripheral circulation. Respiratory: Normal respiratory effort.  No retractions. Lungs CTAB there is a localized wheezed in the left midlung field. Gastrointestinal: Soft and nontender. No distention. No abdominal bruits. No CVA tenderness. Musculoskeletal: No lower extremity tenderness nor edema.  No joint effusions. Neurologic:  Normal speech and language. No gross focal neurologic deficits are appreciated. No gait instability. Skin:  Skin is warm, dry and intact. No rash noted. Psychiatric: Mood and affect are normal. Speech and behavior are normal.  ____________________________________________   LABS (all labs ordered are listed, but only abnormal results are displayed)  Labs Reviewed    URINALYSIS, COMPLETE (UACMP) WITH MICROSCOPIC - Abnormal; Notable for the following:       Result Value   Color, Urine YELLOW (*)    APPearance CLEAR (*)    Squamous Epithelial / LPF 0-5 (*)    All other components within normal limits  COMPREHENSIVE METABOLIC PANEL - Abnormal; Notable for the following:    Potassium 2.1 (*)    Chloride 95 (*)    Glucose, Bld 172 (*)    BUN 22 (*)    Creatinine, Ser 1.27 (*)    Calcium 8.8 (*)    GFR calc non Af Amer 43 (*)    GFR calc Af Amer 50 (*)    All other components within normal limits  CBC WITH DIFFERENTIAL/PLATELET - Abnormal; Notable for the following:    WBC 11.5 (*)    RBC 5.21 (*)    Neutro Abs 7.2 (*)    Monocytes Absolute 1.0 (*)    All other components within normal limits  MAGNESIUM - Abnormal; Notable for the following:    Magnesium 1.6 (*)    All other components within normal limits  TROPONIN I  CBG MONITORING, ED   ____________________________________________  EKG   ____________________________________________  RADIOLOGY    ____________________________________________   PROCEDURES  Procedure(s) performed:  Procedures  Critical Care performed:   ____________________________________________   INITIAL IMPRESSION / ASSESSMENT AND PLAN / ED COURSE  Pertinent labs & imaging results that were available during my care of the patient were reviewed by me and considered in my medical decision making (see chart for details).   patient is feeling much better after the potassium and magnesium I will let her go.     ____________________________________________   FINAL CLINICAL IMPRESSION(S) / ED DIAGNOSES  Final diagnoses:  Dizziness      NEW MEDICATIONS STARTED DURING THIS VISIT:  New Prescriptions   No medications on file     Note:  This document was prepared using Dragon voice recognition software and may include unintentional dictation errors.    Nena Polio, MD 04/05/17 2236

## 2017-04-06 DIAGNOSIS — J4520 Mild intermittent asthma, uncomplicated: Secondary | ICD-10-CM | POA: Diagnosis not present

## 2017-04-06 DIAGNOSIS — F329 Major depressive disorder, single episode, unspecified: Secondary | ICD-10-CM | POA: Diagnosis not present

## 2017-04-06 DIAGNOSIS — K7460 Unspecified cirrhosis of liver: Secondary | ICD-10-CM | POA: Diagnosis not present

## 2017-04-06 DIAGNOSIS — E1140 Type 2 diabetes mellitus with diabetic neuropathy, unspecified: Secondary | ICD-10-CM | POA: Diagnosis not present

## 2017-04-06 DIAGNOSIS — I10 Essential (primary) hypertension: Secondary | ICD-10-CM | POA: Diagnosis not present

## 2017-04-06 DIAGNOSIS — I6990 Unspecified sequelae of unspecified cerebrovascular disease: Secondary | ICD-10-CM | POA: Diagnosis not present

## 2017-04-06 DIAGNOSIS — J449 Chronic obstructive pulmonary disease, unspecified: Secondary | ICD-10-CM | POA: Diagnosis not present

## 2017-04-06 DIAGNOSIS — H9193 Unspecified hearing loss, bilateral: Secondary | ICD-10-CM | POA: Diagnosis not present

## 2017-04-06 DIAGNOSIS — I2723 Pulmonary hypertension due to lung diseases and hypoxia: Secondary | ICD-10-CM | POA: Diagnosis not present

## 2017-04-06 DIAGNOSIS — K759 Inflammatory liver disease, unspecified: Secondary | ICD-10-CM | POA: Diagnosis not present

## 2017-04-06 DIAGNOSIS — G40909 Epilepsy, unspecified, not intractable, without status epilepticus: Secondary | ICD-10-CM | POA: Diagnosis not present

## 2017-04-06 DIAGNOSIS — K210 Gastro-esophageal reflux disease with esophagitis: Secondary | ICD-10-CM | POA: Diagnosis not present

## 2017-04-06 DIAGNOSIS — J9621 Acute and chronic respiratory failure with hypoxia: Secondary | ICD-10-CM | POA: Diagnosis not present

## 2017-04-07 DIAGNOSIS — K7460 Unspecified cirrhosis of liver: Secondary | ICD-10-CM | POA: Diagnosis not present

## 2017-04-07 DIAGNOSIS — G40909 Epilepsy, unspecified, not intractable, without status epilepticus: Secondary | ICD-10-CM | POA: Diagnosis not present

## 2017-04-07 DIAGNOSIS — J9621 Acute and chronic respiratory failure with hypoxia: Secondary | ICD-10-CM | POA: Diagnosis not present

## 2017-04-07 DIAGNOSIS — I10 Essential (primary) hypertension: Secondary | ICD-10-CM | POA: Diagnosis not present

## 2017-04-07 DIAGNOSIS — K759 Inflammatory liver disease, unspecified: Secondary | ICD-10-CM | POA: Diagnosis not present

## 2017-04-07 DIAGNOSIS — F329 Major depressive disorder, single episode, unspecified: Secondary | ICD-10-CM | POA: Diagnosis not present

## 2017-04-07 DIAGNOSIS — J449 Chronic obstructive pulmonary disease, unspecified: Secondary | ICD-10-CM | POA: Diagnosis not present

## 2017-04-07 DIAGNOSIS — H9193 Unspecified hearing loss, bilateral: Secondary | ICD-10-CM | POA: Diagnosis not present

## 2017-04-07 DIAGNOSIS — E1140 Type 2 diabetes mellitus with diabetic neuropathy, unspecified: Secondary | ICD-10-CM | POA: Diagnosis not present

## 2017-04-09 DIAGNOSIS — I10 Essential (primary) hypertension: Secondary | ICD-10-CM | POA: Diagnosis not present

## 2017-04-09 DIAGNOSIS — H9193 Unspecified hearing loss, bilateral: Secondary | ICD-10-CM | POA: Diagnosis not present

## 2017-04-09 DIAGNOSIS — K759 Inflammatory liver disease, unspecified: Secondary | ICD-10-CM | POA: Diagnosis not present

## 2017-04-09 DIAGNOSIS — J9621 Acute and chronic respiratory failure with hypoxia: Secondary | ICD-10-CM | POA: Diagnosis not present

## 2017-04-09 DIAGNOSIS — F329 Major depressive disorder, single episode, unspecified: Secondary | ICD-10-CM | POA: Diagnosis not present

## 2017-04-09 DIAGNOSIS — J449 Chronic obstructive pulmonary disease, unspecified: Secondary | ICD-10-CM | POA: Diagnosis not present

## 2017-04-09 DIAGNOSIS — G40909 Epilepsy, unspecified, not intractable, without status epilepticus: Secondary | ICD-10-CM | POA: Diagnosis not present

## 2017-04-09 DIAGNOSIS — K7460 Unspecified cirrhosis of liver: Secondary | ICD-10-CM | POA: Diagnosis not present

## 2017-04-09 DIAGNOSIS — E1140 Type 2 diabetes mellitus with diabetic neuropathy, unspecified: Secondary | ICD-10-CM | POA: Diagnosis not present

## 2017-04-10 DIAGNOSIS — J9621 Acute and chronic respiratory failure with hypoxia: Secondary | ICD-10-CM | POA: Diagnosis not present

## 2017-04-10 DIAGNOSIS — H9193 Unspecified hearing loss, bilateral: Secondary | ICD-10-CM | POA: Diagnosis not present

## 2017-04-10 DIAGNOSIS — F329 Major depressive disorder, single episode, unspecified: Secondary | ICD-10-CM | POA: Diagnosis not present

## 2017-04-10 DIAGNOSIS — K759 Inflammatory liver disease, unspecified: Secondary | ICD-10-CM | POA: Diagnosis not present

## 2017-04-10 DIAGNOSIS — K7460 Unspecified cirrhosis of liver: Secondary | ICD-10-CM | POA: Diagnosis not present

## 2017-04-10 DIAGNOSIS — J449 Chronic obstructive pulmonary disease, unspecified: Secondary | ICD-10-CM | POA: Diagnosis not present

## 2017-04-10 DIAGNOSIS — I10 Essential (primary) hypertension: Secondary | ICD-10-CM | POA: Diagnosis not present

## 2017-04-10 DIAGNOSIS — E1140 Type 2 diabetes mellitus with diabetic neuropathy, unspecified: Secondary | ICD-10-CM | POA: Diagnosis not present

## 2017-04-10 DIAGNOSIS — G40909 Epilepsy, unspecified, not intractable, without status epilepticus: Secondary | ICD-10-CM | POA: Diagnosis not present

## 2017-04-13 DIAGNOSIS — J449 Chronic obstructive pulmonary disease, unspecified: Secondary | ICD-10-CM | POA: Diagnosis not present

## 2017-04-13 DIAGNOSIS — R000 Tachycardia, unspecified: Secondary | ICD-10-CM | POA: Diagnosis not present

## 2017-04-13 DIAGNOSIS — I10 Essential (primary) hypertension: Secondary | ICD-10-CM | POA: Diagnosis not present

## 2017-04-13 DIAGNOSIS — K210 Gastro-esophageal reflux disease with esophagitis: Secondary | ICD-10-CM | POA: Diagnosis not present

## 2017-04-14 DIAGNOSIS — I10 Essential (primary) hypertension: Secondary | ICD-10-CM | POA: Diagnosis not present

## 2017-04-14 DIAGNOSIS — H9193 Unspecified hearing loss, bilateral: Secondary | ICD-10-CM | POA: Diagnosis not present

## 2017-04-14 DIAGNOSIS — K7460 Unspecified cirrhosis of liver: Secondary | ICD-10-CM | POA: Diagnosis not present

## 2017-04-14 DIAGNOSIS — K759 Inflammatory liver disease, unspecified: Secondary | ICD-10-CM | POA: Diagnosis not present

## 2017-04-14 DIAGNOSIS — J449 Chronic obstructive pulmonary disease, unspecified: Secondary | ICD-10-CM | POA: Diagnosis not present

## 2017-04-14 DIAGNOSIS — E1140 Type 2 diabetes mellitus with diabetic neuropathy, unspecified: Secondary | ICD-10-CM | POA: Diagnosis not present

## 2017-04-14 DIAGNOSIS — G40909 Epilepsy, unspecified, not intractable, without status epilepticus: Secondary | ICD-10-CM | POA: Diagnosis not present

## 2017-04-14 DIAGNOSIS — F329 Major depressive disorder, single episode, unspecified: Secondary | ICD-10-CM | POA: Diagnosis not present

## 2017-04-14 DIAGNOSIS — J9621 Acute and chronic respiratory failure with hypoxia: Secondary | ICD-10-CM | POA: Diagnosis not present

## 2017-04-17 DIAGNOSIS — G40909 Epilepsy, unspecified, not intractable, without status epilepticus: Secondary | ICD-10-CM | POA: Diagnosis not present

## 2017-04-17 DIAGNOSIS — K7460 Unspecified cirrhosis of liver: Secondary | ICD-10-CM | POA: Diagnosis not present

## 2017-04-17 DIAGNOSIS — J449 Chronic obstructive pulmonary disease, unspecified: Secondary | ICD-10-CM | POA: Diagnosis not present

## 2017-04-17 DIAGNOSIS — E1140 Type 2 diabetes mellitus with diabetic neuropathy, unspecified: Secondary | ICD-10-CM | POA: Diagnosis not present

## 2017-04-17 DIAGNOSIS — I10 Essential (primary) hypertension: Secondary | ICD-10-CM | POA: Diagnosis not present

## 2017-04-17 DIAGNOSIS — J9621 Acute and chronic respiratory failure with hypoxia: Secondary | ICD-10-CM | POA: Diagnosis not present

## 2017-04-17 DIAGNOSIS — K759 Inflammatory liver disease, unspecified: Secondary | ICD-10-CM | POA: Diagnosis not present

## 2017-04-17 DIAGNOSIS — H9193 Unspecified hearing loss, bilateral: Secondary | ICD-10-CM | POA: Diagnosis not present

## 2017-04-17 DIAGNOSIS — F329 Major depressive disorder, single episode, unspecified: Secondary | ICD-10-CM | POA: Diagnosis not present

## 2017-04-21 DIAGNOSIS — J449 Chronic obstructive pulmonary disease, unspecified: Secondary | ICD-10-CM | POA: Diagnosis not present

## 2017-04-21 DIAGNOSIS — I10 Essential (primary) hypertension: Secondary | ICD-10-CM | POA: Diagnosis not present

## 2017-04-21 DIAGNOSIS — K7460 Unspecified cirrhosis of liver: Secondary | ICD-10-CM | POA: Diagnosis not present

## 2017-04-21 DIAGNOSIS — K759 Inflammatory liver disease, unspecified: Secondary | ICD-10-CM | POA: Diagnosis not present

## 2017-04-21 DIAGNOSIS — G40909 Epilepsy, unspecified, not intractable, without status epilepticus: Secondary | ICD-10-CM | POA: Diagnosis not present

## 2017-04-21 DIAGNOSIS — J9621 Acute and chronic respiratory failure with hypoxia: Secondary | ICD-10-CM | POA: Diagnosis not present

## 2017-04-21 DIAGNOSIS — H9193 Unspecified hearing loss, bilateral: Secondary | ICD-10-CM | POA: Diagnosis not present

## 2017-04-21 DIAGNOSIS — E1140 Type 2 diabetes mellitus with diabetic neuropathy, unspecified: Secondary | ICD-10-CM | POA: Diagnosis not present

## 2017-04-21 DIAGNOSIS — F329 Major depressive disorder, single episode, unspecified: Secondary | ICD-10-CM | POA: Diagnosis not present

## 2017-04-27 DIAGNOSIS — I10 Essential (primary) hypertension: Secondary | ICD-10-CM | POA: Diagnosis not present

## 2017-04-27 DIAGNOSIS — J449 Chronic obstructive pulmonary disease, unspecified: Secondary | ICD-10-CM | POA: Diagnosis not present

## 2017-04-27 DIAGNOSIS — R000 Tachycardia, unspecified: Secondary | ICD-10-CM | POA: Diagnosis not present

## 2017-04-28 DIAGNOSIS — E1140 Type 2 diabetes mellitus with diabetic neuropathy, unspecified: Secondary | ICD-10-CM | POA: Diagnosis not present

## 2017-04-28 DIAGNOSIS — H9193 Unspecified hearing loss, bilateral: Secondary | ICD-10-CM | POA: Diagnosis not present

## 2017-04-28 DIAGNOSIS — J449 Chronic obstructive pulmonary disease, unspecified: Secondary | ICD-10-CM | POA: Diagnosis not present

## 2017-04-28 DIAGNOSIS — J9621 Acute and chronic respiratory failure with hypoxia: Secondary | ICD-10-CM | POA: Diagnosis not present

## 2017-04-28 DIAGNOSIS — K759 Inflammatory liver disease, unspecified: Secondary | ICD-10-CM | POA: Diagnosis not present

## 2017-04-28 DIAGNOSIS — I10 Essential (primary) hypertension: Secondary | ICD-10-CM | POA: Diagnosis not present

## 2017-04-28 DIAGNOSIS — G40909 Epilepsy, unspecified, not intractable, without status epilepticus: Secondary | ICD-10-CM | POA: Diagnosis not present

## 2017-04-28 DIAGNOSIS — F329 Major depressive disorder, single episode, unspecified: Secondary | ICD-10-CM | POA: Diagnosis not present

## 2017-04-28 DIAGNOSIS — K7460 Unspecified cirrhosis of liver: Secondary | ICD-10-CM | POA: Diagnosis not present

## 2017-04-29 ENCOUNTER — Inpatient Hospital Stay: Payer: Medicare HMO

## 2017-04-29 ENCOUNTER — Inpatient Hospital Stay
Admission: EM | Admit: 2017-04-29 | Discharge: 2017-05-07 | DRG: 871 | Disposition: A | Discharge status: Skilled Nursing Facility | Payer: Medicare HMO | Attending: Internal Medicine | Admitting: Internal Medicine

## 2017-04-29 ENCOUNTER — Emergency Department: Payer: Medicare HMO

## 2017-04-29 DIAGNOSIS — J449 Chronic obstructive pulmonary disease, unspecified: Secondary | ICD-10-CM | POA: Diagnosis not present

## 2017-04-29 DIAGNOSIS — A419 Sepsis, unspecified organism: Principal | ICD-10-CM | POA: Diagnosis present

## 2017-04-29 DIAGNOSIS — N179 Acute kidney failure, unspecified: Secondary | ICD-10-CM | POA: Diagnosis not present

## 2017-04-29 DIAGNOSIS — Z9842 Cataract extraction status, left eye: Secondary | ICD-10-CM | POA: Diagnosis not present

## 2017-04-29 DIAGNOSIS — I248 Other forms of acute ischemic heart disease: Secondary | ICD-10-CM | POA: Diagnosis present

## 2017-04-29 DIAGNOSIS — K7460 Unspecified cirrhosis of liver: Secondary | ICD-10-CM | POA: Diagnosis present

## 2017-04-29 DIAGNOSIS — K210 Gastro-esophageal reflux disease with esophagitis: Secondary | ICD-10-CM | POA: Diagnosis not present

## 2017-04-29 DIAGNOSIS — J9621 Acute and chronic respiratory failure with hypoxia: Secondary | ICD-10-CM | POA: Diagnosis not present

## 2017-04-29 DIAGNOSIS — R262 Difficulty in walking, not elsewhere classified: Secondary | ICD-10-CM | POA: Diagnosis not present

## 2017-04-29 DIAGNOSIS — Z885 Allergy status to narcotic agent status: Secondary | ICD-10-CM

## 2017-04-29 DIAGNOSIS — I959 Hypotension, unspecified: Secondary | ICD-10-CM | POA: Diagnosis present

## 2017-04-29 DIAGNOSIS — R410 Disorientation, unspecified: Secondary | ICD-10-CM | POA: Diagnosis not present

## 2017-04-29 DIAGNOSIS — R1312 Dysphagia, oropharyngeal phase: Secondary | ICD-10-CM | POA: Diagnosis not present

## 2017-04-29 DIAGNOSIS — R569 Unspecified convulsions: Secondary | ICD-10-CM | POA: Diagnosis not present

## 2017-04-29 DIAGNOSIS — I952 Hypotension due to drugs: Secondary | ICD-10-CM | POA: Diagnosis present

## 2017-04-29 DIAGNOSIS — H905 Unspecified sensorineural hearing loss: Secondary | ICD-10-CM | POA: Diagnosis present

## 2017-04-29 DIAGNOSIS — E782 Mixed hyperlipidemia: Secondary | ICD-10-CM | POA: Diagnosis present

## 2017-04-29 DIAGNOSIS — J9811 Atelectasis: Secondary | ICD-10-CM | POA: Diagnosis present

## 2017-04-29 DIAGNOSIS — Z4659 Encounter for fitting and adjustment of other gastrointestinal appliance and device: Secondary | ICD-10-CM

## 2017-04-29 DIAGNOSIS — E1140 Type 2 diabetes mellitus with diabetic neuropathy, unspecified: Secondary | ICD-10-CM | POA: Diagnosis present

## 2017-04-29 DIAGNOSIS — G9341 Metabolic encephalopathy: Secondary | ICD-10-CM | POA: Diagnosis not present

## 2017-04-29 DIAGNOSIS — F17210 Nicotine dependence, cigarettes, uncomplicated: Secondary | ICD-10-CM | POA: Diagnosis present

## 2017-04-29 DIAGNOSIS — T4275XA Adverse effect of unspecified antiepileptic and sedative-hypnotic drugs, initial encounter: Secondary | ICD-10-CM | POA: Diagnosis present

## 2017-04-29 DIAGNOSIS — I10 Essential (primary) hypertension: Secondary | ICD-10-CM | POA: Diagnosis present

## 2017-04-29 DIAGNOSIS — J9692 Respiratory failure, unspecified with hypercapnia: Secondary | ICD-10-CM | POA: Diagnosis not present

## 2017-04-29 DIAGNOSIS — Z7902 Long term (current) use of antithrombotics/antiplatelets: Secondary | ICD-10-CM

## 2017-04-29 DIAGNOSIS — Z9841 Cataract extraction status, right eye: Secondary | ICD-10-CM | POA: Diagnosis not present

## 2017-04-29 DIAGNOSIS — E872 Acidosis: Secondary | ICD-10-CM | POA: Diagnosis present

## 2017-04-29 DIAGNOSIS — Y95 Nosocomial condition: Secondary | ICD-10-CM | POA: Diagnosis present

## 2017-04-29 DIAGNOSIS — Z9981 Dependence on supplemental oxygen: Secondary | ICD-10-CM

## 2017-04-29 DIAGNOSIS — M6281 Muscle weakness (generalized): Secondary | ICD-10-CM | POA: Diagnosis not present

## 2017-04-29 DIAGNOSIS — I479 Paroxysmal tachycardia, unspecified: Secondary | ICD-10-CM | POA: Diagnosis not present

## 2017-04-29 DIAGNOSIS — Z7189 Other specified counseling: Secondary | ICD-10-CM

## 2017-04-29 DIAGNOSIS — Z4682 Encounter for fitting and adjustment of non-vascular catheter: Secondary | ICD-10-CM | POA: Diagnosis not present

## 2017-04-29 DIAGNOSIS — I5022 Chronic systolic (congestive) heart failure: Secondary | ICD-10-CM | POA: Diagnosis not present

## 2017-04-29 DIAGNOSIS — R41841 Cognitive communication deficit: Secondary | ICD-10-CM | POA: Diagnosis not present

## 2017-04-29 DIAGNOSIS — I2723 Pulmonary hypertension due to lung diseases and hypoxia: Secondary | ICD-10-CM | POA: Diagnosis not present

## 2017-04-29 DIAGNOSIS — E870 Hyperosmolality and hypernatremia: Secondary | ICD-10-CM | POA: Diagnosis not present

## 2017-04-29 DIAGNOSIS — E8770 Fluid overload, unspecified: Secondary | ICD-10-CM | POA: Diagnosis not present

## 2017-04-29 DIAGNOSIS — Z881 Allergy status to other antibiotic agents status: Secondary | ICD-10-CM

## 2017-04-29 DIAGNOSIS — J440 Chronic obstructive pulmonary disease with acute lower respiratory infection: Secondary | ICD-10-CM | POA: Diagnosis not present

## 2017-04-29 DIAGNOSIS — Z886 Allergy status to analgesic agent status: Secondary | ICD-10-CM

## 2017-04-29 DIAGNOSIS — Z882 Allergy status to sulfonamides status: Secondary | ICD-10-CM

## 2017-04-29 DIAGNOSIS — Z79899 Other long term (current) drug therapy: Secondary | ICD-10-CM

## 2017-04-29 DIAGNOSIS — J9601 Acute respiratory failure with hypoxia: Secondary | ICD-10-CM

## 2017-04-29 DIAGNOSIS — Z8673 Personal history of transient ischemic attack (TIA), and cerebral infarction without residual deficits: Secondary | ICD-10-CM

## 2017-04-29 DIAGNOSIS — I2510 Atherosclerotic heart disease of native coronary artery without angina pectoris: Secondary | ICD-10-CM | POA: Diagnosis not present

## 2017-04-29 DIAGNOSIS — I6359 Cerebral infarction due to unspecified occlusion or stenosis of other cerebral artery: Secondary | ICD-10-CM | POA: Diagnosis not present

## 2017-04-29 DIAGNOSIS — R05 Cough: Secondary | ICD-10-CM | POA: Diagnosis not present

## 2017-04-29 DIAGNOSIS — Z961 Presence of intraocular lens: Secondary | ICD-10-CM | POA: Diagnosis present

## 2017-04-29 DIAGNOSIS — J4520 Mild intermittent asthma, uncomplicated: Secondary | ICD-10-CM | POA: Diagnosis not present

## 2017-04-29 DIAGNOSIS — E876 Hypokalemia: Secondary | ICD-10-CM | POA: Diagnosis present

## 2017-04-29 DIAGNOSIS — I6990 Unspecified sequelae of unspecified cerebrovascular disease: Secondary | ICD-10-CM | POA: Diagnosis not present

## 2017-04-29 DIAGNOSIS — Z880 Allergy status to penicillin: Secondary | ICD-10-CM

## 2017-04-29 DIAGNOSIS — J189 Pneumonia, unspecified organism: Secondary | ICD-10-CM

## 2017-04-29 DIAGNOSIS — Z888 Allergy status to other drugs, medicaments and biological substances status: Secondary | ICD-10-CM

## 2017-04-29 DIAGNOSIS — Z7951 Long term (current) use of inhaled steroids: Secondary | ICD-10-CM

## 2017-04-29 DIAGNOSIS — G40901 Epilepsy, unspecified, not intractable, with status epilepticus: Secondary | ICD-10-CM | POA: Diagnosis not present

## 2017-04-29 DIAGNOSIS — Z452 Encounter for adjustment and management of vascular access device: Secondary | ICD-10-CM | POA: Diagnosis not present

## 2017-04-29 DIAGNOSIS — J9690 Respiratory failure, unspecified, unspecified whether with hypoxia or hypercapnia: Secondary | ICD-10-CM | POA: Diagnosis not present

## 2017-04-29 DIAGNOSIS — R059 Cough, unspecified: Secondary | ICD-10-CM

## 2017-04-29 DIAGNOSIS — Z7401 Bed confinement status: Secondary | ICD-10-CM | POA: Diagnosis not present

## 2017-04-29 DIAGNOSIS — J181 Lobar pneumonia, unspecified organism: Secondary | ICD-10-CM | POA: Diagnosis present

## 2017-04-29 DIAGNOSIS — Z5189 Encounter for other specified aftercare: Secondary | ICD-10-CM | POA: Diagnosis not present

## 2017-04-29 DIAGNOSIS — Z66 Do not resuscitate: Secondary | ICD-10-CM | POA: Diagnosis present

## 2017-04-29 DIAGNOSIS — G40909 Epilepsy, unspecified, not intractable, without status epilepticus: Secondary | ICD-10-CM | POA: Diagnosis not present

## 2017-04-29 DIAGNOSIS — J90 Pleural effusion, not elsewhere classified: Secondary | ICD-10-CM | POA: Diagnosis not present

## 2017-04-29 DIAGNOSIS — J810 Acute pulmonary edema: Secondary | ICD-10-CM | POA: Diagnosis not present

## 2017-04-29 LAB — COMPREHENSIVE METABOLIC PANEL
ALBUMIN: 3.4 g/dL — AB (ref 3.5–5.0)
ALT: 10 U/L — AB (ref 14–54)
AST: 20 U/L (ref 15–41)
Alkaline Phosphatase: 94 U/L (ref 38–126)
Anion gap: 15 (ref 5–15)
BUN: 27 mg/dL — AB (ref 6–20)
CHLORIDE: 95 mmol/L — AB (ref 101–111)
CO2: 30 mmol/L (ref 22–32)
CREATININE: 1.83 mg/dL — AB (ref 0.44–1.00)
Calcium: 9 mg/dL (ref 8.9–10.3)
GFR calc Af Amer: 32 mL/min — ABNORMAL LOW (ref >60)
GFR calc non Af Amer: 28 mL/min — ABNORMAL LOW (ref >60)
GLUCOSE: 212 mg/dL — AB (ref 65–99)
Potassium: 2.1 mmol/L — CL (ref 3.5–5.1)
Sodium: 140 mmol/L (ref 135–145)
TOTAL PROTEIN: 7 g/dL (ref 6.5–8.1)
Total Bilirubin: 0.6 mg/dL (ref 0.3–1.2)

## 2017-04-29 LAB — LACTIC ACID, PLASMA
Lactic Acid, Venous: 2.1 mmol/L (ref 0.5–1.9)
Lactic Acid, Venous: 1.5 mmol/L (ref 0.5–1.9)
Lactic Acid, Venous: 6.5 mmol/L (ref 0.5–1.9)

## 2017-04-29 LAB — CBC WITH DIFFERENTIAL/PLATELET
BASOS ABS: 0 10*3/uL (ref 0–0.1)
BASOS PCT: 0 %
Eosinophils Absolute: 0 10*3/uL (ref 0–0.7)
Eosinophils Relative: 0 %
HEMATOCRIT: 45.6 % (ref 35.0–47.0)
Hemoglobin: 15.5 g/dL (ref 12.0–16.0)
LYMPHS PCT: 10 %
Lymphs Abs: 2.2 10*3/uL (ref 1.0–3.6)
MCH: 27.8 pg (ref 26.0–34.0)
MCHC: 34 g/dL (ref 32.0–36.0)
MCV: 81.7 fL (ref 80.0–100.0)
Monocytes Absolute: 1.1 10*3/uL — ABNORMAL HIGH (ref 0.2–0.9)
Monocytes Relative: 5 %
NEUTROS ABS: 18.5 10*3/uL — AB (ref 1.4–6.5)
NEUTROS PCT: 85 %
Platelets: 316 10*3/uL (ref 150–440)
RBC: 5.58 MIL/uL — AB (ref 3.80–5.20)
RDW: 13.2 % (ref 11.5–14.5)
WBC: 21.8 10*3/uL — AB (ref 3.6–11.0)

## 2017-04-29 LAB — BLOOD GAS, ARTERIAL
ACID-BASE DEFICIT: 0.1 mmol/L (ref 0.0–2.0)
BICARBONATE: 27.6 mmol/L (ref 20.0–28.0)
FIO2: 100
O2 SAT: 99.9 %
PCO2 ART: 56 mmHg — AB (ref 32.0–48.0)
PEEP/CPAP: 5 cmH2O
PO2 ART: 290 mmHg — AB (ref 83.0–108.0)
Patient temperature: 37
RATE: 14 resp/min
VT: 500 mL
pH, Arterial: 7.3 — ABNORMAL LOW (ref 7.350–7.450)

## 2017-04-29 LAB — URINE DRUG SCREEN, QUALITATIVE (ARMC ONLY)
Amphetamines, Ur Screen: NOT DETECTED
BARBITURATES, UR SCREEN: NOT DETECTED
Benzodiazepine, Ur Scrn: NOT DETECTED
CANNABINOID 50 NG, UR ~~LOC~~: NOT DETECTED
COCAINE METABOLITE, UR ~~LOC~~: NOT DETECTED
MDMA (ECSTASY) UR SCREEN: NOT DETECTED
Methadone Scn, Ur: NOT DETECTED
OPIATE, UR SCREEN: NOT DETECTED
Phencyclidine (PCP) Ur S: NOT DETECTED
Tricyclic, Ur Screen: NOT DETECTED

## 2017-04-29 LAB — BLOOD GAS, VENOUS
Acid-Base Excess: 6 mmol/L — ABNORMAL HIGH (ref 0.0–2.0)
BICARBONATE: 33.5 mmol/L — AB (ref 20.0–28.0)
O2 SAT: 81.2 %
PATIENT TEMPERATURE: 37
PO2 VEN: 47 mmHg — AB (ref 32.0–45.0)
pCO2, Ven: 58 mmHg (ref 44.0–60.0)
pH, Ven: 7.37 (ref 7.250–7.430)

## 2017-04-29 LAB — GLUCOSE, CAPILLARY
GLUCOSE-CAPILLARY: 312 mg/dL — AB (ref 65–99)
Glucose-Capillary: 282 mg/dL — ABNORMAL HIGH (ref 65–99)
Glucose-Capillary: 302 mg/dL — ABNORMAL HIGH (ref 65–99)
Glucose-Capillary: 314 mg/dL — ABNORMAL HIGH (ref 65–99)
Glucose-Capillary: 325 mg/dL — ABNORMAL HIGH (ref 65–99)

## 2017-04-29 LAB — PROTIME-INR
INR: 1.03
Prothrombin Time: 13.4 seconds (ref 11.4–15.2)

## 2017-04-29 LAB — AMMONIA: Ammonia: 24 umol/L (ref 9–35)

## 2017-04-29 LAB — PROCALCITONIN: Procalcitonin: <0.1 ng/mL

## 2017-04-29 LAB — PHOSPHORUS: Phosphorus: 3.5 mg/dL (ref 2.5–4.6)

## 2017-04-29 LAB — MAGNESIUM: MAGNESIUM: 1.9 mg/dL (ref 1.7–2.4)

## 2017-04-29 LAB — MRSA PCR SCREENING: MRSA by PCR: NEGATIVE

## 2017-04-29 LAB — TRIGLYCERIDES: Triglycerides: 133 mg/dL (ref <150)

## 2017-04-29 LAB — TROPONIN I: TROPONIN I: 0.04 ng/mL — AB (ref <0.03)

## 2017-04-29 LAB — APTT: APTT: 26 s (ref 24–36)

## 2017-04-29 MED ORDER — ORAL CARE MOUTH RINSE
15.0000 mL | OROMUCOSAL | Status: DC
  Administered 2017-04-29 – 2017-04-30 (×6): 15 mL via OROMUCOSAL

## 2017-04-29 MED ORDER — MONTELUKAST SODIUM 10 MG PO TABS: 10.0000 mg | ORAL_TABLET | Freq: Every day | ORAL | Status: DC

## 2017-04-29 MED ORDER — SODIUM CHLORIDE 0.9 % IV SOLN
0.0000 ug/min | INTRAVENOUS | Status: DC
  Administered 2017-04-29: 20 ug/min via INTRAVENOUS
  Administered 2017-04-29: 55 ug/min via INTRAVENOUS
  Filled 2017-04-29: qty 1
  Filled 2017-04-29: qty 10

## 2017-04-29 MED ORDER — STERILE WATER FOR INJECTION IJ SOLN
INTRAMUSCULAR | Status: AC
  Administered 2017-04-29: 10 mL
  Filled 2017-04-29: qty 10

## 2017-04-29 MED ORDER — ALBUTEROL SULFATE HFA 108 (90 BASE) MCG/ACT IN AERS: 2.0000 | INHALATION_SPRAY | RESPIRATORY_TRACT | Status: DC | PRN

## 2017-04-29 MED ORDER — ORAL CARE MOUTH RINSE
15.0000 mL | Freq: Four times a day (QID) | OROMUCOSAL | Status: DC
  Administered 2017-04-29: 15 mL via OROMUCOSAL

## 2017-04-29 MED ORDER — METHYLPREDNISOLONE SODIUM SUCC 125 MG IJ SOLR
125.0000 mg | Freq: Once | INTRAMUSCULAR | Status: AC
  Administered 2017-04-29: 125 mg via INTRAVENOUS
  Filled 2017-04-29: qty 2

## 2017-04-29 MED ORDER — GABAPENTIN 300 MG PO CAPS
300.0000 mg | ORAL_CAPSULE | Freq: Two times a day (BID) | ORAL | Status: DC
  Administered 2017-04-29 – 2017-04-30 (×2): 300 mg via ORAL
  Filled 2017-04-29 (×2): qty 1

## 2017-04-29 MED ORDER — LORAZEPAM 2 MG/ML IJ SOLN: 2.0000 mg | Freq: Once | INTRAMUSCULAR | Status: DC

## 2017-04-29 MED ORDER — LORAZEPAM 2 MG/ML IJ SOLN
1.0000 mg | INTRAMUSCULAR | Status: DC | PRN
  Administered 2017-04-29 (×2): 1 mg via INTRAVENOUS
  Administered 2017-04-30: 2 mg via INTRAVENOUS
  Filled 2017-04-29 (×3): qty 1

## 2017-04-29 MED ORDER — LORAZEPAM 2 MG/ML IJ SOLN
2.0000 mg | Freq: Once | INTRAMUSCULAR | Status: AC
  Administered 2017-04-29: 2 mg via INTRAVENOUS

## 2017-04-29 MED ORDER — DEXTROSE 5 % IV SOLN
1.0000 g | Freq: Once | INTRAVENOUS | Status: AC
  Administered 2017-04-29: 1 g via INTRAVENOUS
  Filled 2017-04-29: qty 1

## 2017-04-29 MED ORDER — CLOPIDOGREL BISULFATE 75 MG PO TABS: 75.0000 mg | ORAL_TABLET | Freq: Every day | ORAL | Status: DC

## 2017-04-29 MED ORDER — CITALOPRAM HYDROBROMIDE 20 MG PO TABS
20.0000 mg | ORAL_TABLET | Freq: Every day | ORAL | Status: DC
Start: 2017-04-29 — End: 2017-04-29

## 2017-04-29 MED ORDER — VANCOMYCIN HCL IN DEXTROSE 750-5 MG/150ML-% IV SOLN
750.0000 mg | INTRAVENOUS | Status: DC
  Filled 2017-04-29: qty 150

## 2017-04-29 MED ORDER — VANCOMYCIN HCL IN DEXTROSE 1-5 GM/200ML-% IV SOLN
1000.0000 mg | Freq: Once | INTRAVENOUS | Status: DC
  Filled 2017-04-29: qty 200

## 2017-04-29 MED ORDER — IPRATROPIUM-ALBUTEROL 0.5-2.5 (3) MG/3ML IN SOLN
3.0000 mL | Freq: Once | RESPIRATORY_TRACT | Status: AC
  Administered 2017-04-29: 3 mL via RESPIRATORY_TRACT
  Filled 2017-04-29: qty 3

## 2017-04-29 MED ORDER — SODIUM CHLORIDE 0.9 % IV BOLUS (SEPSIS)
1000.0000 mL | Freq: Once | INTRAVENOUS | Status: AC
  Administered 2017-04-29: 1000 mL via INTRAVENOUS

## 2017-04-29 MED ORDER — PANTOPRAZOLE SODIUM 40 MG PO TBEC: 40.0000 mg | DELAYED_RELEASE_TABLET | Freq: Every day | ORAL | Status: DC

## 2017-04-29 MED ORDER — FAMOTIDINE IN NACL 20-0.9 MG/50ML-% IV SOLN: 20.0000 mg | INTRAVENOUS | Status: DC

## 2017-04-29 MED ORDER — DILTIAZEM HCL ER 180 MG PO CP24
180.0000 mg | ORAL_CAPSULE | Freq: Every day | ORAL | Status: DC
  Filled 2017-04-29: qty 1

## 2017-04-29 MED ORDER — FENTANYL CITRATE (PF) 100 MCG/2ML IJ SOLN
100.0000 ug | INTRAMUSCULAR | Status: DC | PRN
  Administered 2017-04-30: 100 ug via INTRAVENOUS

## 2017-04-29 MED ORDER — MAGNESIUM SULFATE 2 GM/50ML IV SOLN
2.0000 g | Freq: Once | INTRAVENOUS | Status: AC
Start: 2017-04-29 — End: 2017-04-29
  Administered 2017-04-29: 2 g via INTRAVENOUS
  Filled 2017-04-29: qty 50

## 2017-04-29 MED ORDER — SODIUM CHLORIDE 0.9 % IV SOLN
150.0000 mg | Freq: Once | INTRAVENOUS | Status: AC
  Administered 2017-04-29: 150 mg via INTRAVENOUS
  Filled 2017-04-29: qty 15

## 2017-04-29 MED ORDER — VANCOMYCIN HCL IN DEXTROSE 1-5 GM/200ML-% IV SOLN: 1000.0000 mg | Freq: Once | INTRAVENOUS | Status: DC

## 2017-04-29 MED ORDER — VECURONIUM BROMIDE 10 MG IV SOLR
INTRAVENOUS | Status: AC
  Administered 2017-04-29: 10 mg
  Filled 2017-04-29: qty 10

## 2017-04-29 MED ORDER — SODIUM CHLORIDE 0.9 % IV SOLN
200.0000 mg | Freq: Two times a day (BID) | INTRAVENOUS | Status: DC
  Administered 2017-04-30 – 2017-05-06 (×13): 200 mg via INTRAVENOUS
  Filled 2017-04-29 (×24): qty 20

## 2017-04-29 MED ORDER — HYDROCHLOROTHIAZIDE 12.5 MG PO CAPS
12.5000 mg | ORAL_CAPSULE | Freq: Every day | ORAL | Status: DC
  Filled 2017-04-29: qty 1

## 2017-04-29 MED ORDER — ETOMIDATE 2 MG/ML IV SOLN: 20.0000 mg | Freq: Once | INTRAVENOUS | Status: DC

## 2017-04-29 MED ORDER — SODIUM CHLORIDE 0.9 % IV SOLN
50.0000 mg | Freq: Once | INTRAVENOUS | Status: AC
  Administered 2017-04-29: 50 mg via INTRAVENOUS
  Filled 2017-04-29: qty 5

## 2017-04-29 MED ORDER — LOSARTAN POTASSIUM 50 MG PO TABS: 100.0000 mg | ORAL_TABLET | Freq: Every day | ORAL | Status: DC

## 2017-04-29 MED ORDER — POTASSIUM CHLORIDE 20 MEQ/15ML (10%) PO SOLN
40.0000 meq | Freq: Three times a day (TID) | ORAL | Status: AC
  Administered 2017-04-29 – 2017-04-30 (×3): 40 meq
  Filled 2017-04-29 (×5): qty 30

## 2017-04-29 MED ORDER — PROPOFOL 1000 MG/100ML IV EMUL
0.0000 ug/kg/min | INTRAVENOUS | Status: DC
  Administered 2017-04-29 (×2): 20 ug/kg/min via INTRAVENOUS
  Filled 2017-04-29: qty 100

## 2017-04-29 MED ORDER — PANTOPRAZOLE SODIUM 40 MG IV SOLR
40.0000 mg | Freq: Every day | INTRAVENOUS | Status: DC
  Administered 2017-04-29: 40 mg via INTRAVENOUS
  Filled 2017-04-29: qty 40

## 2017-04-29 MED ORDER — GUAIFENESIN-DM 100-10 MG/5ML PO SYRP
5.0000 mL | ORAL_SOLUTION | Freq: Four times a day (QID) | ORAL | Status: DC | PRN
  Filled 2017-04-29: qty 5

## 2017-04-29 MED ORDER — MOMETASONE FURO-FORMOTEROL FUM 200-5 MCG/ACT IN AERO
2.0000 | INHALATION_SPRAY | Freq: Two times a day (BID) | RESPIRATORY_TRACT | Status: DC
  Filled 2017-04-29: qty 8.8

## 2017-04-29 MED ORDER — VANCOMYCIN HCL IN DEXTROSE 1-5 GM/200ML-% IV SOLN
1000.0000 mg | Freq: Once | INTRAVENOUS | Status: AC
  Administered 2017-04-29: 1000 mg via INTRAVENOUS
  Filled 2017-04-29: qty 200

## 2017-04-29 MED ORDER — ALBUTEROL SULFATE (2.5 MG/3ML) 0.083% IN NEBU: 2.5000 mg | INHALATION_SOLUTION | RESPIRATORY_TRACT | Status: DC | PRN

## 2017-04-29 MED ORDER — POTASSIUM CHLORIDE 2 MEQ/ML IV SOLN
INTRAVENOUS | Status: DC
  Administered 2017-04-29 – 2017-04-30 (×2): via INTRAVENOUS
  Filled 2017-04-29 (×5): qty 1000

## 2017-04-29 MED ORDER — INSULIN ASPART 100 UNIT/ML ~~LOC~~ SOLN
0.0000 [IU] | SUBCUTANEOUS | Status: DC
  Administered 2017-04-29 (×3): 11 [IU] via SUBCUTANEOUS
  Administered 2017-04-30 (×2): 2 [IU] via SUBCUTANEOUS
  Administered 2017-04-30: 11 [IU] via SUBCUTANEOUS
  Administered 2017-04-30: 8 [IU] via SUBCUTANEOUS
  Administered 2017-04-30 – 2017-05-01 (×2): 3 [IU] via SUBCUTANEOUS
  Administered 2017-05-01: 2 [IU] via SUBCUTANEOUS
  Administered 2017-05-01: 3 [IU] via SUBCUTANEOUS
  Administered 2017-05-01: 2 [IU] via SUBCUTANEOUS
  Administered 2017-05-01: 5 [IU] via SUBCUTANEOUS
  Administered 2017-05-01 – 2017-05-02 (×3): 3 [IU] via SUBCUTANEOUS
  Administered 2017-05-02 (×3): 2 [IU] via SUBCUTANEOUS
  Administered 2017-05-02: 3 [IU] via SUBCUTANEOUS
  Administered 2017-05-03 (×2): 5 [IU] via SUBCUTANEOUS
  Filled 2017-04-29 (×21): qty 1

## 2017-04-29 MED ORDER — POTASSIUM CHLORIDE 10 MEQ/100ML IV SOLN
10.0000 meq | INTRAVENOUS | Status: AC
  Administered 2017-04-29 (×2): 10 meq via INTRAVENOUS
  Filled 2017-04-29 (×2): qty 100

## 2017-04-29 MED ORDER — ETOMIDATE 2 MG/ML IV SOLN
INTRAVENOUS | Status: AC
  Administered 2017-04-29: 20 mg
  Filled 2017-04-29: qty 10

## 2017-04-29 MED ORDER — SODIUM CHLORIDE 0.9 % IV SOLN
200.0000 mg | Freq: Two times a day (BID) | INTRAVENOUS | Status: DC
  Filled 2017-04-29: qty 20

## 2017-04-29 MED ORDER — CHLORHEXIDINE GLUCONATE 0.12% ORAL RINSE (MEDLINE KIT)
15.0000 mL | Freq: Two times a day (BID) | OROMUCOSAL | Status: DC
  Administered 2017-04-29 – 2017-04-30 (×3): 15 mL via OROMUCOSAL

## 2017-04-29 MED ORDER — CLOPIDOGREL BISULFATE 75 MG PO TABS
75.0000 mg | ORAL_TABLET | Freq: Every day | ORAL | Status: DC
  Administered 2017-04-29 – 2017-04-30 (×2): 75 mg
  Filled 2017-04-29 (×2): qty 1

## 2017-04-29 MED ORDER — LORAZEPAM 2 MG/ML IJ SOLN
INTRAMUSCULAR | Status: AC
  Filled 2017-04-29: qty 1

## 2017-04-29 MED ORDER — FENTANYL CITRATE (PF) 100 MCG/2ML IJ SOLN
100.0000 ug | INTRAMUSCULAR | Status: DC | PRN
  Filled 2017-04-29: qty 2

## 2017-04-29 MED ORDER — LORAZEPAM 2 MG/ML IJ SOLN
1.0000 mg | Freq: Once | INTRAMUSCULAR | Status: AC
  Administered 2017-04-29: 1 mg via INTRAVENOUS

## 2017-04-29 MED ORDER — NOREPINEPHRINE BITARTRATE 1 MG/ML IV SOLN
0.0000 ug/min | INTRAVENOUS | Status: DC
  Administered 2017-04-29: 20 ug/min via INTRAVENOUS
  Filled 2017-04-29 (×3): qty 16

## 2017-04-29 MED ORDER — SODIUM CHLORIDE 0.9 % IV SOLN: 75.0000 mL/h | INTRAVENOUS | Status: DC

## 2017-04-29 MED ORDER — VECURONIUM BROMIDE 10 MG IV SOLR: 10.0000 mg | Freq: Once | INTRAVENOUS | Status: DC

## 2017-04-29 MED ORDER — SODIUM CHLORIDE 0.9 % IV BOLUS (SEPSIS)
250.0000 mL | Freq: Once | INTRAVENOUS | Status: AC
  Administered 2017-04-29: 250 mL via INTRAVENOUS

## 2017-04-29 MED ORDER — ROPINIROLE HCL 0.25 MG PO TABS
0.2500 mg | ORAL_TABLET | Freq: Three times a day (TID) | ORAL | Status: DC
  Filled 2017-04-29 (×2): qty 1

## 2017-04-29 MED ORDER — ALBUTEROL SULFATE (2.5 MG/3ML) 0.083% IN NEBU
10.0000 mg | INHALATION_SOLUTION | Freq: Once | RESPIRATORY_TRACT | Status: AC
  Administered 2017-04-29: 10 mg via RESPIRATORY_TRACT
  Filled 2017-04-29: qty 12

## 2017-04-29 MED ORDER — LACOSAMIDE 150 MG PO TABS: 150.0000 mg | ORAL_TABLET | Freq: Two times a day (BID) | ORAL | Status: DC

## 2017-04-29 MED ORDER — DEXTROSE 5 % IV SOLN
1.0000 g | INTRAVENOUS | Status: DC
  Filled 2017-04-29: qty 1

## 2017-04-29 MED ORDER — LORAZEPAM 2 MG/ML IJ SOLN
INTRAMUSCULAR | Status: AC
  Administered 2017-04-29: 2 mg via INTRAVENOUS
  Filled 2017-04-29: qty 1

## 2017-04-29 MED ORDER — IPRATROPIUM-ALBUTEROL 0.5-2.5 (3) MG/3ML IN SOLN
3.0000 mL | Freq: Four times a day (QID) | RESPIRATORY_TRACT | Status: DC
  Administered 2017-04-29 – 2017-05-03 (×14): 3 mL via RESPIRATORY_TRACT
  Filled 2017-04-29 (×15): qty 3

## 2017-04-29 MED ORDER — LOSARTAN POTASSIUM-HCTZ 100-12.5 MG PO TABS: 1.0000 | ORAL_TABLET | Freq: Every day | ORAL | Status: DC

## 2017-04-29 NOTE — ED Notes (Signed)
Pt began having another seizure, MD notified. VORB for 17m IV ativan. Full body involvement noted. Seizure lasting approx 30 seconds at this time. MD to bedside to assess patient.

## 2017-04-29 NOTE — ED Notes (Signed)
Mild seizure like activity lasting about 30 seconds.

## 2017-04-29 NOTE — Consult Note (Signed)
Reason for Consult:Seizures Referring Physician: Manuella Ghazi  CC: Seizures  HPI: [name] is an 66 y.o. female with a history of seizures felt to be precipitated by hypoxia who presented today in respiratory distress with witnessed 5-10s seizure en route. Patient with two further seizures since presentation.  Has been given 74m of Ativan and loaded with home dose of Keppra.  Patient recently admitted (September) and had seizures at that time as well.  Maintenance Vimpat increased during that hospitalization from 598mBID to 15079mID prior to discharge.  Patient on O2 at home.      Past Medical History:  Diagnosis Date  . Asthma   . Cirrhosis, non-alcoholic ([place] . COPD (chronic obstructive pulmonary disease) ([place] . Deaf   . Depression   . Diabetes mellitus without complication ([place] . GERD (gastroesophageal reflux disease)   . Heart murmur   . Hepatitis   . Hypertension   . Lymph node disorder    arm  . Neuropathy   . On home oxygen therapy    hs  . Orthopnea   . RLS (restless legs syndrome)   . Seizures ([place] . Shortness of breath dyspnea   . Sleep apnea   . [facility]  [facility]    Past Surgical History:  Procedure Laterality Date  . CATARACT EXTRACTION W/PHACO Right 11/23/2014   Procedure: CATARACT EXTRACTION PHACO AND INTRAOCULAR LENS PLACEMENT (IOC);  Surgeon: NisLyla GlassingD;  Location: [facility] ORS;  Service: Ophthalmology;  Laterality: Right;  . CATARACT EXTRACTION W/PHACO Left 12/14/2014   Procedure: CATARACT EXTRACTION PHACO AND INTRAOCULAR LENS PLACEMENT (IOC);  Surgeon: NisLyla GlassingD;  Location: [facility] ORS;  Service: Ophthalmology;  Laterality: Left;  US:01:16.6 AP:15.8 CDE:12.14  . CESAREAN SECTION    . CHOLECYSTECTOMY    . KNEE ARTHROPLASTY    . THUMB ARTHROSCOPY    . TONSILLECTOMY    . TYMPANOPLASTY     muliple    Family History  Problem Relation Age of Onset  . Lung cancer Mother   . CAD Father     Social History:  reports that she has  been smoking.  She has been smoking about 0.50 packs per day. She has never used smokeless tobacco. She reports that she does not drink alcohol or use drugs.  Allergies  Allergen Reactions  . Aspirin Itching  . Celebrex [Celecoxib] Itching    itching  . Ciprofloxacin Itching  . Codeine Itching  . Fosphenytoin Itching  . Levaquin [Levofloxacin In D5w] Itching  . Levofloxacin Itching  . Lovastatin Itching  . Penicillins     Documentation indicates severe reaction  Pt tolerated cephalosporin without adverse reaction 09/18   . Pravastatin Itching  . Sulfa Antibiotics Itching    Medications: I have reviewed the patient's current medications. Prior to Admission:  Prior to Admission medications   Medication Sig Start Date End Date Taking? Authorizing Provider  clopidogrel (PLAVIX) 75 MG tablet Take 1 tablet (75 mg total) by mouth daily. 07/17/16  Yes VacVaughan BastaD  diltiazem (DILACOR XR) 180 MG 24 hr capsule Take 180 mg by mouth daily.   Yes [provider]  furosemide (LASIX) 40 MG tablet Take 1 tablet (40 mg total) by mouth daily. Patient taking differently: Take 20 mg by mouth daily.  07/17/16  Yes VacVaughan BastaD  gabapentin (NEURONTIN) 300 MG capsule Take 1 capsule by mouth 2 (two) times daily.  01/16/16  Yes [provider]  GLIPIZIDE XL 5 MG 24 hr tablet Take 2 tablets (10 mg total) by mouth daily. Patient taking differently: Take 5 mg by mouth daily.  02/12/17  Yes Max Sane, MD  lacosamide 150 MG TABS Take 1 tablet (150 mg total) by mouth 2 (two) times daily. 03/30/17  Yes Demetrios Loll, MD  losartan-hydrochlorothiazide [facility]) 100-12.5 MG tablet Take 1 tablet by mouth daily.   Yes [provider]  montelukast (SINGULAIR) 10 MG tablet Take 1 tablet by mouth at bedtime. 03/18/16  Yes [provider]  omeprazole (PRILOSEC) 20 MG capsule Take 20 mg by mouth daily.   Yes [provider]  rosuvastatin (CRESTOR) 10 MG  tablet Take 10 mg by mouth daily. 04/21/17  Yes [provider]  triamcinolone ointment (KENALOG) 0.1 % USE 1 (ONE) APPLICATION TWO TIMES DAILY 04/21/17  Yes [provider]  albuterol (PROVENTIL HFA;VENTOLIN HFA) 108 (90 BASE) MCG/ACT inhaler Inhale 2 puffs into the lungs every 4 (four) hours as needed for wheezing or shortness of breath.    [provider]  albuterol (PROVENTIL) (2.5 MG/3ML) 0.083% nebulizer solution Take 3 mLs (2.5 mg total) by nebulization every 4 (four) hours as needed for wheezing or shortness of breath. Patient not taking: Reported on 04/29/2017 07/17/16   Vaughan Basta, MD  citalopram (CELEXA) 20 MG tablet Take 1 tablet (20 mg total) by mouth daily. Patient not taking: Reported on 04/29/2017 07/17/16   Vaughan Basta, MD  clindamycin (CLEOCIN) 300 MG capsule Take 2 capsules (600 mg total) by mouth every 8 (eight) hours. Patient not taking: Reported on 04/29/2017 03/30/17   Demetrios Loll, MD  EPINEPHrine 0.3 mg/0.3 mL IJ SOAJ injection Inject into the muscle once.    [provider]  guaiFENesin-dextromethorphan (ROBITUSSIN DM) 100-10 MG/5ML syrup Take 5 mLs by mouth every 6 (six) hours as needed for cough. 03/30/17   Demetrios Loll, MD  mometasone-formoterol [facility]) 200-5 MCG/ACT AERO Inhale 2 puffs into the lungs 2 (two) times daily. 07/17/16   Vaughan Basta, MD  rOPINIRole (REQUIP) 0.25 MG tablet Take 1 tablet (0.25 mg total) by mouth 3 (three) times daily. 03/24/16   Oswald Hillock, MD    ROS: Unable to provide due to lethargy  Physical Examination: Blood pressure (!) 108/42, pulse (!) 59, resp. rate (!) 27, SpO2 96 %.  HEENT-  Normocephalic, no lesions, without obvious abnormality.  Normal external eye and conjunctiva.  Normal TM's bilaterally.  Normal auditory canals and external ears. Normal external nose, mucus membranes and septum.  Normal pharynx. Cardiovascular- S1, S2 normal, pulses palpable throughout    Lungs- decreased breath sounds Abdomen- soft, non-tender; bowel sounds normal; no masses,  no organomegaly Extremities- no edema Lymph-no adenopathy palpable Musculoskeletal-no joint tenderness, deformity or swelling Skin-warm and dry, no hyperpigmentation, vitiligo, or suspicious lesions  Neurological Examination   Mental Status: Lethargic.  Opens eyes to painful stimulation.  Not following nonverbal sues.   Cranial Nerves: II: Discs flat bilaterally; Pupils equal, round, reactive to light and accommodation III,IV, VI: Intact oculocephalic maneuvers V,VII: corneals intact bilaterally VIII: deaf IX,X: gag reflex present XI: unable to test XII: unable to test Motor: Spontaneously moves right more than left but able to lift all extremities against gravity.   Sensory: Responds to noxious stimuli throughout Deep Tendon Reflexes: 3+ and symmetric with sustained clonus in the lower extremities Plantars: Right: upgoing   Left: upgoing Cerebellar: Unable to perform due to lethargy Gait: not tested due to safety concerns    Laboratory Studies:  Basic Metabolic Panel:  Recent Labs Lab 04/29/17 1115  NA 140  K 2.1*  CL 95*  CO2 30  GLUCOSE 212*  BUN 27*  CREATININE 1.83*  CALCIUM 9.0    Liver Function Tests:  Recent Labs Lab 04/29/17 1115  AST 20  ALT 10*  ALKPHOS 94  BILITOT 0.6  PROT 7.0  ALBUMIN 3.4*   No results for input(s): LIPASE, AMYLASE in the last 168 hours.  Recent Labs Lab 04/29/17 1115  AMMONIA 24    CBC:  Recent Labs Lab 04/29/17 1115  WBC 21.8*  NEUTROABS 18.5*  HGB 15.5  HCT 45.6  MCV 81.7  PLT 316    Cardiac Enzymes:  Recent Labs Lab 04/29/17 1115  TROPONINI 0.04*    BNP: Invalid input(s): POCBNP  CBG: No results for input(s): GLUCAP in the last 168 hours.  Microbiology: Results for orders placed or performed during the hospital encounter of 03/24/17  Blood culture (routine x 2)     Status: None   Collection Time:  03/24/17  7:10 PM  Result Value Ref Range Status   Specimen Description BLOOD BLOOD LEFT FOREARM BLOOD RIGHT FOREARM  Final   Special Requests   Final    BOTTLES DRAWN AEROBIC AND ANAEROBIC Blood Culture adequate volume   Culture NO GROWTH 5 DAYS  Final   Report Status 03/29/2017 FINAL  Final  Blood culture (routine x 2)     Status: None   Collection Time: 03/24/17  7:11 PM  Result Value Ref Range Status   Specimen Description BLOOD BLOOD LEFT FOREARM  Final   Special Requests   Final    BOTTLES DRAWN AEROBIC AND ANAEROBIC Blood Culture adequate volume   Culture NO GROWTH 5 DAYS  Final   Report Status 03/29/2017 FINAL  Final  MRSA PCR Screening     Status: None   Collection Time: 03/25/17  2:44 AM  Result Value Ref Range Status   MRSA by PCR NEGATIVE NEGATIVE Final    Comment:        The GeneXpert MRSA Assay (FDA approved for NASAL specimens only), is one component of a comprehensive MRSA colonization surveillance program. It is not intended to diagnose MRSA infection nor to guide or monitor treatment for MRSA infections.     Coagulation Studies: No results for input(s): LABPROT, INR in the last 72 hours.  Urinalysis: No results for input(s): COLORURINE, LABSPEC, PHURINE, GLUCOSEU, HGBUR, BILIRUBINUR, KETONESUR, PROTEINUR, UROBILINOGEN, NITRITE, LEUKOCYTESUR in the last 168 hours.  Invalid input(s): APPERANCEUR  Lipid Panel:  No results found for: CHOL, TRIG, HDL, CHOLHDL, VLDL, LDLCALC  HgbA1C:  Lab Results  Component Value Date   HGBA1C 7.9 (H) 07/12/2016    Urine Drug Screen:  No results found for: LABOPIA, COCAINSCRNUR, LABBENZ, AMPHETMU, THCU, LABBARB  Alcohol Level: No results for input(s): ETH in the last 168 hours.   Imaging: Dg Chest Portable 1 View  Result Date: 04/29/2017 CLINICAL DATA:  Cough EXAM: PORTABLE CHEST 1 VIEW COMPARISON:  Chest radiograph March 29, 2017 and chest CT April 05, 2017 FINDINGS: There is less consolidation in the left  lower lobe compared to recent studies. There is a minimal left pleural effusion. Right lung is clear. Heart is upper normal in size with pulmonary vascularity within normal limits. There is aortic atherosclerosis. No adenopathy. No focal bone lesions evident. IMPRESSION: There remains consolidation in the left lower lobe but less than on recent studies consistent with partial clearing of pneumonia from the left lower lobe. There  is a minimal left pleural effusion. Lungs elsewhere are clear. Cardiac silhouette is stable. There is aortic atherosclerosis. Aortic Atherosclerosis (ICD10-I70.0). Electronically Signed   By: Lowella Grip III M.D.   On: 04/29/2017 12:00     Assessment/Plan: 66 year old female with a history of seizures felt precipitated by hypoxia who presents in respiratory distress and with multiple seizures.  On Vimpat at home.  Felt to be septic.  Currently post-ictal.  O2 sats fluctuating.  Patient has received 131m of Vimpat as a loading dose.  Has received Ativan as well.    Recommendations: 1. Vimpat 57mIV now 2.  Increase maintenance Vimpat to 20069mV q 12 hours 3.  Agree with addressing infection and attempts to maintain O2 sats.   4.  Ativan prn seizure activity 5.  Seizure precautions 6.  Will continue to follow.  If further signs of focality would consider head imaging.     LesAlexis GoodellD Neurology [phone]/24/2018, 2:03 PM

## 2017-04-29 NOTE — ED Notes (Signed)
Ed provider at bedside

## 2017-04-29 NOTE — ED Triage Notes (Signed)
patient came in with respiratory distress, ems reports she seized enroute for 5 - 10 secs, patient is deaf but can read lips. Patient seized for approximately 30 sec at about 1110 am, patient uses 2 lpm [place] at home.

## 2017-04-29 NOTE — ED Provider Notes (Signed)
[facility] Emergency Department Provider Note  ____________________________________________  Time seen: Approximately 11:33 AM  I have reviewed the triage vital signs and the nursing notes.   HISTORY  Chief Complaint Respiratory Distress (Hx Seizures, one on EMS )   HPI [name] is a 66 y.o. female with a history of nonalcoholic cirrhosis, COPD, diabetes, hypertension, seizure disorder, deafness who presents for evaluation of shortness of breath and seizure. According to EMS they were called to the house for shortness of breath. Patient has had several days of productive cough, progressively worsening shortness of breath and several daily episodes of watery diarrhea. No melena. Patient does have a history of aspiration pneumonia in the setting of seizures. En route to the emergency department patient had a witnessed seizure. Patient tells me that she hasn't been taking her antiepileptics because they make her sick. When asked her when was the last time she took her medicine she tells me "a while". Patient denies chest pain, abdominal pain, nausea, , fever or chills.  Past Medical History:  Diagnosis Date  . Asthma   . Cirrhosis, non-alcoholic ([place])   . COPD (chronic obstructive pulmonary disease) ([place])   . Deaf   . Depression   . Diabetes mellitus without complication ([place])   . GERD (gastroesophageal reflux disease)   . Heart murmur   . Hepatitis   . Hypertension   . Lymph node disorder    arm  . Neuropathy   . On home oxygen therapy    hs  . Orthopnea   . RLS (restless legs syndrome)   . Seizures ([place])   . Shortness of breath dyspnea   . Sleep apnea   . Stroke [facility])    tia    Patient Active Problem List   Diagnosis Date Noted  . Community acquired pneumonia of left lower lobe of lung ([place])   . Hypoxia   . Acute on chronic respiratory failure with hypoxia ([place]) 03/24/2017  . Hypokalemia 02/12/2017  . Sepsis ([place]) 02/11/2017  . GERD  (gastroesophageal reflux disease) 03/23/2016  . Depression 03/23/2016  . Convulsions/seizures ([place]) 03/22/2016  . DM (diabetes mellitus), type 2 ([place]) 03/22/2016  . Essential hypertension 03/22/2016  . Neuropathy 03/22/2016  . Convulsions ([place]) 03/22/2016  . COPD exacerbation ([place]) 03/31/2015  . Acute respiratory failure with hypoxia ([place]) 03/29/2015    Past Surgical History:  Procedure Laterality Date  . CATARACT EXTRACTION W/PHACO Right 11/23/2014   Procedure: CATARACT EXTRACTION PHACO AND INTRAOCULAR LENS PLACEMENT (IOC);  Surgeon: Lyla Glassing, MD;  Location: [facility] ORS;  Service: Ophthalmology;  Laterality: Right;  . CATARACT EXTRACTION W/PHACO Left 12/14/2014   Procedure: CATARACT EXTRACTION PHACO AND INTRAOCULAR LENS PLACEMENT (IOC);  Surgeon: Lyla Glassing, MD;  Location: [facility] ORS;  Service: Ophthalmology;  Laterality: Left;  US:01:16.6 AP:15.8 CDE:12.14  . CESAREAN SECTION    . CHOLECYSTECTOMY    . KNEE ARTHROPLASTY    . THUMB ARTHROSCOPY    . TONSILLECTOMY    . TYMPANOPLASTY     muliple    Prior to Admission medications   Medication Sig Start Date End Date Taking? Authorizing Provider  albuterol (PROVENTIL HFA;VENTOLIN HFA) 108 (90 BASE) MCG/ACT inhaler Inhale 2 puffs into the lungs every 4 (four) hours as needed for wheezing or shortness of breath.    [provider]  albuterol (PROVENTIL) (2.5 MG/3ML) 0.083% nebulizer solution Take 3 mLs (2.5 mg total) by nebulization every 4 (four) hours as needed for wheezing or shortness of breath. 07/17/16  Vaughan Basta, MD  citalopram (CELEXA) 20 MG tablet Take 1 tablet (20 mg total) by mouth daily. Patient taking differently: Take 20 mg by mouth at bedtime.  07/17/16   Vaughan Basta, MD  clindamycin (CLEOCIN) 300 MG capsule Take 2 capsules (600 mg total) by mouth every 8 (eight) hours. 03/30/17   Demetrios Loll, MD  clopidogrel (PLAVIX) 75 MG tablet Take 1 tablet (75 mg total) by mouth daily. 07/17/16    Vaughan Basta, MD  diltiazem (DILACOR XR) 180 MG 24 hr capsule Take 180 mg by mouth daily.    [provider]  EPINEPHrine 0.3 mg/0.3 mL IJ SOAJ injection Inject into the muscle once.    [provider]  furosemide (LASIX) 40 MG tablet Take 1 tablet (40 mg total) by mouth daily. 07/17/16   Vaughan Basta, MD  gabapentin (NEURONTIN) 300 MG capsule Take 1 capsule by mouth 2 (two) times daily.  01/16/16   [provider]  GLIPIZIDE XL 5 MG 24 hr tablet Take 2 tablets (10 mg total) by mouth daily. Patient taking differently: Take 5 mg by mouth daily.  02/12/17   Max Sane, MD  guaiFENesin-dextromethorphan (ROBITUSSIN DM) 100-10 MG/5ML syrup Take 5 mLs by mouth every 6 (six) hours as needed for cough. 03/30/17   Demetrios Loll, MD  lacosamide 150 MG TABS Take 1 tablet (150 mg total) by mouth 2 (two) times daily. 03/30/17   Demetrios Loll, MD  losartan-hydrochlorothiazide [facility]) 100-12.5 MG tablet Take 1 tablet by mouth daily.    [provider]  mometasone-formoterol (DULERA) 200-5 MCG/ACT AERO Inhale 2 puffs into the lungs 2 (two) times daily. 07/17/16   Vaughan Basta, MD  montelukast (SINGULAIR) 10 MG tablet Take 1 tablet by mouth at bedtime. 03/18/16   [provider]  omeprazole (PRILOSEC) 20 MG capsule Take 20 mg by mouth daily.    [provider]  rOPINIRole (REQUIP) 0.25 MG tablet Take 1 tablet (0.25 mg total) by mouth 3 (three) times daily. 03/24/16   Oswald Hillock, MD  rosuvastatin (CRESTOR) 10 MG tablet Take 10 mg by mouth daily. 04/21/17   [provider]  triamcinolone ointment (KENALOG) 0.1 % USE 1 (ONE) APPLICATION TWO TIMES DAILY 04/21/17   [provider]    Allergies Aspirin; Celebrex [celecoxib]; Ciprofloxacin; Codeine; Fosphenytoin; Levaquin [levofloxacin in d5w]; Levofloxacin; Lovastatin; Penicillins; Pravastatin; and Sulfa antibiotics  Family History  Problem Relation Age of Onset  . Lung  cancer Mother   . CAD Father     Social History Social History  Substance Use Topics  . Smoking status: Current Every Day Smoker    Packs/day: 0.50  . Smokeless tobacco: Never Used  . Alcohol use No    Review of Systems  Constitutional: Negative for fever. Eyes: Negative for visual changes. ENT: Negative for sore throat. Neck: No neck pain  Cardiovascular: Negative for chest pain. Respiratory: + shortness of breath and cough Gastrointestinal: Negative for abdominal pain, vomiting. + diarrhea. Genitourinary: Negative for dysuria. Musculoskeletal: Negative for back pain. Skin: Negative for rash. Neurological: Negative for headaches, weakness or numbness. + seizure Psych: No SI or HI  ____________________________________________   PHYSICAL EXAM:  VITAL SIGNS:  Vitals:   04/29/17 1346 04/29/17 1400  BP: (!) 108/42 (!) 116/53  Pulse: (!) 59 64  Resp: (!) 27 (!) 29  SpO2: 96% 91%   Constitutional: Alert and oriented, no distress, coughing.  HEENT:      Head: Normocephalic and atraumatic.  Eyes: Conjunctivae are normal. Sclera is non-icteric.       Mouth/Throat: Mucous membranes are moist.       Neck: Supple with no signs of meningismus. Cardiovascular: Regular rate and rhythm. No murmurs, gallops, or rubs. 2+ symmetrical distal pulses are present in all extremities. No JVD. Respiratory: Increased WOB, normal sat, decreased air movement bilaterally with faint wheezing Gastrointestinal: Soft, non tender, and non distended with positive bowel sounds. No rebound or guarding. Musculoskeletal: Nontender with normal range of motion in all extremities. No edema, cyanosis, or erythema of extremities. Neurologic: Face is symmetric. Moving all extremities. Skin: Skin is warm, dry and intact. No rash noted.  ____________________________________________   LABS (all labs ordered are listed, but only abnormal results are displayed)  Labs Reviewed  CBC WITH  DIFFERENTIAL/PLATELET - Abnormal; Notable for the following:       Result Value   WBC 21.8 (*)    RBC 5.58 (*)    Neutro Abs 18.5 (*)    Monocytes Absolute 1.1 (*)    All other components within normal limits  COMPREHENSIVE METABOLIC PANEL - Abnormal; Notable for the following:    Potassium 2.1 (*)    Chloride 95 (*)    Glucose, Bld 212 (*)    BUN 27 (*)    Creatinine, Ser 1.83 (*)    Albumin 3.4 (*)    ALT 10 (*)    GFR calc non Af Amer 28 (*)    GFR calc Af Amer 32 (*)    All other components within normal limits  TROPONIN I - Abnormal; Notable for the following:    Troponin I 0.04 (*)    All other components within normal limits  LACTIC ACID, PLASMA - Abnormal; Notable for the following:    Lactic Acid, Venous 2.1 (*)    All other components within normal limits  CULTURE, BLOOD (ROUTINE X 2)  CULTURE, BLOOD (ROUTINE X 2)  AMMONIA  LACTIC ACID, PLASMA   ____________________________________________  EKG  ED ECG REPORT I, Rudene Re, the attending physician, personally viewed and interpreted this ECG.  Normal sinus rhythm, rate of 78, prolonged QTC, normal axis, no ST elevations or depressions.prolonged QTC is new compared to prior from September 2018. ____________________________________________  RADIOLOGY  CXR:  There remains consolidation in the left lower lobe but less than on recent studies consistent with partial clearing of pneumonia from the left lower lobe. There is a minimal left pleural effusion. Lungs elsewhere are clear. Cardiac silhouette is stable. There is aortic atherosclerosis. ____________________________________________   PROCEDURES  Procedure(s) performed: None Procedures Critical Care performed: yes  CRITICAL CARE Performed by: Rudene Re  ?  Total critical care time: 45 min  Critical care time was exclusive of separately billable procedures and treating other patients.  Critical care was necessary to treat or prevent  imminent or life-threatening deterioration.  Critical care was time spent personally by me on the following activities: development of treatment plan with patient and/or surrogate as well as nursing, discussions with consultants, evaluation of patient's response to treatment, examination of patient, obtaining history from patient or surrogate, ordering and performing treatments and interventions, ordering and review of laboratory studies, ordering and review of radiographic studies, pulse oximetry and re-evaluation of patient's condition.  ____________________________________________   INITIAL IMPRESSION / ASSESSMENT AND PLAN / ED COURSE   66 y.o. female with a history of nonalcoholic cirrhosis, COPD, diabetes, hypertension, seizure disorder, deafness who presents for evaluation of shortness of breath and seizure.  #  seizure: patient endorses noncompliance with her medication. She had a seizure in route with EMS and another one in the emergency department for which she was given 2 mg of Ativan. We'll give her a dose of her Vimpat IV. due to her history of cirrhosis we'll also check an ammonia level.  # SOb and cough: patient has a history of aspiration pneumonia due to her seizures. She does have a very productive cough at this time with decreased air movement and wheezing concerning for possible recurrent pneumonia and COPD exacerbation. Chest x-ray is pending. She has no new oxygen requirement. She has no fever at this time. will start patient on 3 duo nebs, Solu-Medrol, get a chest x-ray. Labs are pending.    _________________________ 12:26 PM on 04/29/2017 -----------------------------------------  patient had 2 more seizures in the emergency department. Has received Ativan 2 mg 2 and is now currently receiving Vimpat. She is back to baseline. Labs and chest x-ray concerning for HCAP and sepsis. cefepime and vancomycin have been ordered and the patient also with acute kidney injury for which  2 L of IV fluid has been ordered. Patient also has hypokalemia and new prolonged QTc, we'll supplement IV K and Mag and avoid by mouth at this time due to several recent seizures. We'll consult the hospitalist for admission to the ICU for acute hypoxic respiratory failurein the setting of pneumonia, COPD exacerbation, and status epilepticus.   As part of my medical decision making, I reviewed the following data within the [facility] notes reviewed and incorporated, Labs reviewed , EKG interpreted , Old EKG reviewed, Old chart reviewed, Radiograph reviewed , Discussed with admitting physician , Notes from prior ED visits and [place] Controlled Substance Database    Pertinent labs & imaging results that were available during my care of the patient were reviewed by me and considered in my medical decision making (see chart for details).    ____________________________________________   FINAL CLINICAL IMPRESSION(S) / ED DIAGNOSES  Final diagnoses:  Sepsis, due to unspecified organism ([place])  HCAP (healthcare-associated pneumonia)  Status epilepticus ([place])  AKI (acute kidney injury) ([place])  Hypokalemia  Acute respiratory failure with hypoxia ([place])      NEW MEDICATIONS STARTED DURING THIS VISIT:  New Prescriptions   No medications on file     Note:  This document was prepared using Dragon voice recognition software and may include unintentional dictation errors.    Rudene Re, MD 04/29/17 [phone]

## 2017-04-29 NOTE — Procedures (Signed)
Endotracheal Intubation Procedure Note  Indication for endotracheal intubation: impending airway compromise. Airway Assessment: Mallampati Class: II (hard and soft palate, upper portion of tonsils anduvula visible). Sedation: etomidate. Paralytic: vecuronium. Lidocaine: no. Atropine: no. Equipment: Mac 3 laryngoscope blade Cricoid Pressure: no. Number of attempts: 1. ETT location confirmed by by auscultation and ETCO2 monitor.  Endotracheal Intubation under direct supervision of Dr. Alva Garnet Chest Xray pending.  Marda Stalker, [place] Pager [phone] (please enter 7 digits) PCCM Consult Pager [phone] (please enter 7 digits)  PCCM attending I was present for and supervised the entire procedure  Merton Border, MD PCCM service Mobile [phone] Pager [phone] 04/29/2017 4:10 PM

## 2017-04-29 NOTE — Progress Notes (Signed)
Per orders, the ETT was advanced 2 cm. It is now 24 at the lip.

## 2017-04-29 NOTE — Progress Notes (Signed)
Pharmacy Antibiotic Note  SHAIDA ROUTE is a 66 y.o. female admitted on 04/29/2017 with sepsis.  Pharmacy has been consulted for cefepime dosing. Patient admitted with respiratory distress and witness seizures. MRSA PCR is negative and Procalcitonin < 0.1. Patient has tolerated cephalosporins on previous admissions.   Plan: Will continue cefepime 1g IV Q24hr with next scheduled dose on 10/25.   Height: _0  (157.5 cm) Weight: 163 lb 12.8 oz (74.3 kg) IBW/kg (Calculated) : 50.1  Temp (24hrs), Avg:98.2 F (36.8 C), Min:98 F (36.7 C), Max:98.3 F (36.8 C)   Recent Labs Lab 04/29/17 1115 04/29/17 1315  WBC 21.8*  --   CREATININE 1.83*  --   LATICACIDVEN 2.1* 1.5    Estimated Creatinine Clearance: 28.9 mL/min (A) (by C-G formula based on SCr of 1.83 mg/dL (H)).    Allergies  Allergen Reactions  . Aspirin Itching  . Celebrex [Celecoxib] Itching    itching  . Ciprofloxacin Itching  . Codeine Itching  . Fosphenytoin Itching  . Levaquin [Levofloxacin In D5w] Itching  . Levofloxacin Itching  . Lovastatin Itching  . Penicillins     Documentation indicates severe reaction  Pt tolerated cephalosporin without adverse reaction 09/18   . Pravastatin Itching  . Sulfa Antibiotics Itching    Antimicrobials this admission: Vancomycin x 1 in the ED, 10/24.  Cefepime 10/24 >>   Dose adjustments this admission: N/A  Microbiology results: 10/24 BCx: pending  10/24 Sputum: pending 10/24 MRSA PCR: negative   Thank you for allowing pharmacy to be a part of this patient's care.  Simpson,Michael L 04/29/2017 7:06 PM

## 2017-04-29 NOTE — ED Notes (Signed)
MD to bedside, pt noted to be having another seizure. O2 sats 88% on 4L via [place]. Pt placed on NRB by MD. Pt's O2 sats 96%. VORB for 90m IV Ativan.

## 2017-04-29 NOTE — Care Management (Addendum)
Admitted from home with seizures most likely due to hypoxia.  Patient has chronic home oxygen through [facility] and last discharge in September liter flow was increased to 3 liters.Placed in icu stepdown.  Have reached out to [facility] to determine if agency is still following patient for SN and PT and informed patient currently on service.  Also has SW

## 2017-04-29 NOTE — Consult Note (Signed)
PULMONARY / CRITICAL CARE MEDICINE   Name: [name] MRN: [id number] DOB: 1950/08/06    ADMISSION DATE:  04/29/2017 CONSULTATION DATE: 04/29/2017  REFERRING MD: Dr. Manuella Ghazi   CHIEF COMPLAINT: Seizures   HISTORY OF PRESENT ILLNESS:   This is a 67 yo female with a PMH of Stroke, OSA, Seizures, Orthopnea, COPD-home O2 qhs, Neuropathy, Lymph Node Disorder, HTN, Hepatitis, GERD, Heart Murmur, Diabetes Mellitus, Depression, Congenital Deafness (she reads lips), Non-Alcoholic Cirrhosis, and Asthma.  She presented to [facility] ER 10/24 via EMS with respiratory distress and seizures.  Per ER notes the pt had a sizure at home at 11:10 am on 10/24, and then had a second seizure en route to the ER that lasted 5-10 seconds witnessed by EMS. Upon arrival to the ER she had two additional seizures and received a total of 3 mg ativan and was loaded with her home dose of Keppra and Neurology consulted.  Per ER notes the pt has not been taking her antiepileptics for "a while" because they made her sick.  In the ER lab results concerning for possible sepsis and chest xray concerning for possible health acquired pneumonia, therefore sepsis protocol initiated.  She was subsequently admitted to ICU by hospitalist team for further workup and treatment PCCM consulted.  Upon arrival to ICU she required mechanical intubation for airway protection due to multiple seizure activity.    PAST MEDICAL HISTORY :  She  has a past medical history of Asthma; Cirrhosis, non-alcoholic ([place]); COPD (chronic obstructive pulmonary disease) ([place]); Deaf; Depression; Diabetes mellitus without complication ([place]); GERD (gastroesophageal reflux disease); Heart murmur; Hepatitis; Hypertension; Lymph node disorder; Neuropathy; On home oxygen therapy; Orthopnea; RLS (restless legs syndrome); Seizures ([place]); Shortness of breath dyspnea; Sleep apnea; and Stroke ([place]).  PAST SURGICAL HISTORY: She  has a past surgical history that includes Thumb  arthroscopy; Tonsillectomy; Cesarean section; Cholecystectomy; Tympanoplasty; Knee Arthroplasty; Cataract extraction w/PHACO (Right, 11/23/2014); and Cataract extraction w/PHACO (Left, 12/14/2014).  Allergies  Allergen Reactions  . Aspirin Itching  . Celebrex [Celecoxib] Itching    itching  . Ciprofloxacin Itching  . Codeine Itching  . Fosphenytoin Itching  . Levaquin [Levofloxacin In D5w] Itching  . Levofloxacin Itching  . Lovastatin Itching  . Penicillins     Documentation indicates severe reaction  Pt tolerated cephalosporin without adverse reaction 09/18   . Pravastatin Itching  . Sulfa Antibiotics Itching    No current facility-administered medications on file prior to encounter.    Current Outpatient Prescriptions on File Prior to Encounter  Medication Sig  . clopidogrel (PLAVIX) 75 MG tablet Take 1 tablet (75 mg total) by mouth daily.  Marland Kitchen diltiazem (DILACOR XR) 180 MG 24 hr capsule Take 180 mg by mouth daily.  . furosemide (LASIX) 40 MG tablet Take 1 tablet (40 mg total) by mouth daily. (Patient taking differently: Take 20 mg by mouth daily. )  . gabapentin (NEURONTIN) 300 MG capsule Take 1 capsule by mouth 2 (two) times daily.   Marland Kitchen GLIPIZIDE XL 5 MG 24 hr tablet Take 2 tablets (10 mg total) by mouth daily. (Patient taking differently: Take 5 mg by mouth daily. )  . lacosamide 150 MG TABS Take 1 tablet (150 mg total) by mouth 2 (two) times daily.  Marland Kitchen losartan-hydrochlorothiazide (HYZAAR) 100-12.5 MG tablet Take 1 tablet by mouth daily.  . montelukast (SINGULAIR) 10 MG tablet Take 1 tablet by mouth at bedtime.  Marland Kitchen omeprazole (PRILOSEC) 20 MG capsule Take 20 mg by mouth daily.  Marland Kitchen albuterol (PROVENTIL  HFA;VENTOLIN HFA) 108 (90 BASE) MCG/ACT inhaler Inhale 2 puffs into the lungs every 4 (four) hours as needed for wheezing or shortness of breath.  Marland Kitchen albuterol (PROVENTIL) (2.5 MG/3ML) 0.083% nebulizer solution Take 3 mLs (2.5 mg total) by nebulization every 4 (four) hours as needed for  wheezing or shortness of breath. (Patient not taking: Reported on 04/29/2017)  . citalopram (CELEXA) 20 MG tablet Take 1 tablet (20 mg total) by mouth daily. (Patient not taking: Reported on 04/29/2017)  . clindamycin (CLEOCIN) 300 MG capsule Take 2 capsules (600 mg total) by mouth every 8 (eight) hours. (Patient not taking: Reported on 04/29/2017)  . EPINEPHrine 0.3 mg/0.3 mL IJ SOAJ injection Inject into the muscle once.  Marland Kitchen guaiFENesin-dextromethorphan (ROBITUSSIN DM) 100-10 MG/5ML syrup Take 5 mLs by mouth every 6 (six) hours as needed for cough.  . mometasone-formoterol (DULERA) 200-5 MCG/ACT AERO Inhale 2 puffs into the lungs 2 (two) times daily.  Marland Kitchen rOPINIRole (REQUIP) 0.25 MG tablet Take 1 tablet (0.25 mg total) by mouth 3 (three) times daily.    FAMILY HISTORY:  Her indicated that her mother is deceased. She indicated that her father is deceased.    SOCIAL HISTORY: She  reports that she has been smoking.  She has been smoking about 0.50 packs per day. She has never used smokeless tobacco. She reports that she does not drink alcohol or use drugs.  REVIEW OF SYSTEMS:   Unable to assess pt intubated  SUBJECTIVE:  Unable to assess pt intubated   VITAL SIGNS: BP (!) 116/53   Pulse 64   Resp (!) 29   Wt 74.3 kg (163 lb 12.8 oz)   SpO2 91%   BMI 29.96 kg/m   HEMODYNAMICS:    VENTILATOR SETTINGS:    INTAKE / OUTPUT: No intake/output data recorded.  PHYSICAL EXAMINATION: General: acutely ill appearing Caucasian female, NAD mechanically intubated Neuro: sedated, no following commands, PERRL HEENT: supple, no JVD Cardiovascular: sinus rhythm with PVC's, s1s2, no M/R/G Lungs: rhonchi throughout, even, non labored Abdomen: +BS x4, soft, non distended  Musculoskeletal: normal bulk and tone, no edema  Skin: intact no rashes or lesions   LABS:  BMET  Recent Labs Lab 04/29/17 1115  NA 140  K 2.1*  CL 95*  CO2 30  BUN 27*  CREATININE 1.83*  GLUCOSE 212*     Electrolytes  Recent Labs Lab 04/29/17 1115  CALCIUM 9.0    CBC  Recent Labs Lab 04/29/17 1115  WBC 21.8*  HGB 15.5  HCT 45.6  PLT 316    Coag's No results for input(s): APTT, INR in the last 168 hours.  Sepsis Markers  Recent Labs Lab 04/29/17 1115 04/29/17 1315  LATICACIDVEN 2.1* 1.5    ABG No results for input(s): PHART, PCO2ART, PO2ART in the last 168 hours.  Liver Enzymes  Recent Labs Lab 04/29/17 1115  AST 20  ALT 10*  ALKPHOS 94  BILITOT 0.6  ALBUMIN 3.4*    Cardiac Enzymes  Recent Labs Lab 04/29/17 1115  TROPONINI 0.04*    Glucose  Recent Labs Lab 04/29/17 1427 04/29/17 1452  GLUCAP 282* 302*    Imaging Dg Chest Portable 1 View  Result Date: 04/29/2017 CLINICAL DATA:  Cough EXAM: PORTABLE CHEST 1 VIEW COMPARISON:  Chest radiograph March 29, 2017 and chest CT April 05, 2017 FINDINGS: There is less consolidation in the left lower lobe compared to recent studies. There is a minimal left pleural effusion. Right lung is clear. Heart is upper normal in  size with pulmonary vascularity within normal limits. There is aortic atherosclerosis. No adenopathy. No focal bone lesions evident. IMPRESSION: There remains consolidation in the left lower lobe but less than on recent studies consistent with partial clearing of pneumonia from the left lower lobe. There is a minimal left pleural effusion. Lungs elsewhere are clear. Cardiac silhouette is stable. There is aortic atherosclerosis. Aortic Atherosclerosis (ICD10-I70.0). Electronically Signed   By: Lowella Grip III M.D.   On: 04/29/2017 12:00   STUDIES:  None  CULTURES: Blood 10/24>> Respiratory 10/24  ANTIBIOTICS: Cefepime 10/24>> Vancomycin 10/24>>  SIGNIFICANT EVENTS: 10/24-Pt admitted to ICU required mechanical intubation upon arrival to ICU   LINES/TUBES: ETT 10/24>>  ASSESSMENT / PLAN:  PULMONARY A: Acute respiratory failure secondary to possible HAP    Mechanical Intubation for airway protection  Hx: COPD and Asthma P:   Vent settings established  Vent bundle implemented Daily SBTas indicated Continue bronchodilator therapy   CARDIOVASCULAR A:  Mildly elevated troponin likely secondary to demand ischemia  P:  Continuous telemetry monitoring Trend troponin's  Hold all outpatient antihypertensives for now   RENAL A:   Lactic Acidosis, Mild-resolved Hypokalemia Acute renal failure  P:   Trend BMP Replace as indicated Monitor UOP Trend lactic acid LR _0  ml/hr  GASTROINTESTINAL A:   No acute issues  P:   SUP: IV PPI Keep NPO for now   HEMATOLOGIC A:   No acute issues  P:  DVT px: SCD's  Monitor CBC intermittently Transfuse per usual guidelines   INFECTIOUS A:   Suspected hospital acquired pneumonia  P:   Monitor temp and wbc count Micro and abx as above   ENDOCRINE A:   Diabetes Mellitus  P:   CBG's q4hrs SSI   NEUROLOGIC A:   Status Epilepticus  ICU/Mechanical Ventilator Discomfort  P:   RASS goal: 0 to -1 PAD Protocol: propofol gtt and prn fentanyl  Neurology Consulted appreciate input-continue Vimpat 200 mg q12hrs and prn ativan for seizure activity  Seizure Precautions      FAMILY  - Updates: No family at bedside to update at this time 04/29/2017  Marda Stalker, [place] Pager [phone] (please enter 7 digits) PCCM Consult Pager [phone] (please enter 7 digits)  PCCM ATTENDING ATTESTATION:  I have evaluated patient with the APP [place], reviewed database in its entirety and discussed care plan in detail. In addition, this patient was discussed on multidisciplinary rounds.  I agree with the above findings, assessment and plan  Important exam findings: Mildly labored respiratory pattern SPO2 low 90s on 100% NRB 2 witnessed seizures while in ICU Diffuse L >R rhonchi Somnolent  Major problems addressed by PCCM team: Status epilepticus Acute  respiratory failure Hypokalemia DM 2   PLAN/REC: Intubated under my supervision Ventilator settings established by me Ventilator bundle implemented Anticonvulsants per neurology SSI protocol  Correct electrolytes   CCM time: 45 mins The above time includes time spent in consultation with patient and/or family members and reviewing care plan on multidisciplinary rounds  Merton Border, MD PCCM service Mobile [phone] Pager [phone]    04/29/2017 4:11 PM

## 2017-04-29 NOTE — H&P (Signed)
[name] at [place] NAME: [name]    MR#:  [id number]  DATE OF BIRTH:  Jan 27, 1951  DATE OF ADMISSION:  04/29/2017  PRIMARY CARE PHYSICIAN: Cletis Athens, MD   REQUESTING/REFERRING PHYSICIAN: Rudene Re, MD  CHIEF COMPLAINT:   Chief Complaint  Patient presents with  . Respiratory Distress    Hx Seizures, one on EMS     HISTORY OF PRESENT ILLNESS:  [name]  is a 66 y.o. female with a known history of nonalcoholic cirrhosis, COPD, diabetes, hypertension, seizure disorder, deafness who presents for evaluation of shortness of breath and seizure (5-10 en route). According to EMS they were called to the house for shortness of breath. Patient has had several days of productive cough, progressively worsening shortness of breath and several daily episodes of watery diarrhea. No melena. Patient does have a history of aspiration pneumonia in the setting of seizures. Patient with two further seizures in ED since presentation.  Has been given 57m of Ativan and loaded with home dose of Keppra.  Patient recently admitted (September) and had seizures at that time as well.  Maintenance Vimpat increased during that hospitalization from 525mBID to 15059mID prior to discharge. She was post-ictal when I saw in ED. PAST MEDICAL HISTORY:   Past Medical History:  Diagnosis Date  . Asthma   . Cirrhosis, non-alcoholic ([facility] . COPD (chronic obstructive pulmonary disease) ([facility] . Deaf   . Depression   . Diabetes mellitus without complication ([place] . GERD (gastroesophageal reflux disease)   . Heart murmur   . Hepatitis   . Hypertension   . Lymph node disorder    arm  . Neuropathy   . On home oxygen therapy    hs  . Orthopnea   . RLS (restless legs syndrome)   . Seizures ([place] . Shortness of breath dyspnea   . Sleep apnea   . [facility] ([facility]  [facility]ia    PAST SURGICAL HISTORY:   Past Surgical History:  Procedure Laterality  Date  . CATARACT EXTRACTION W/PHACO Right 11/23/2014   Procedure: CATARACT EXTRACTION PHACO AND INTRAOCULAR LENS PLACEMENT (IOC);  Surgeon: NisLyla GlassingD;  Location: [facility] ORS;  Service: Ophthalmology;  Laterality: Right;  . CATARACT EXTRACTION W/PHACO Left 12/14/2014   Procedure: CATARACT EXTRACTION PHACO AND INTRAOCULAR LENS PLACEMENT (IOC);  Surgeon: NisLyla GlassingD;  Location: [facility] ORS;  Service: Ophthalmology;  Laterality: Left;  US:01:16.6 AP:15.8 CDE:12.14  . CESAREAN SECTION    . CHOLECYSTECTOMY    . KNEE ARTHROPLASTY    . THUMB ARTHROSCOPY    . TONSILLECTOMY    . TYMPANOPLASTY     muliple    SOCIAL HISTORY:   Social History  Substance Use Topics  . Smoking status: Current Every Day Smoker    Packs/day: 0.50  . Smokeless tobacco: Never Used  . Alcohol use No    FAMILY HISTORY:   Family History  Problem Relation Age of Onset  . Lung cancer Mother   . CAD Father     DRUG ALLERGIES:   Allergies  Allergen Reactions  . Aspirin Itching  . Celebrex [Celecoxib] Itching    itching  . Ciprofloxacin Itching  . Codeine Itching  . Fosphenytoin Itching  . Levaquin [Levofloxacin In D5w] Itching  . Levofloxacin Itching  . Lovastatin Itching  . Penicillins     Documentation indicates severe reaction  Pt tolerated cephalosporin without adverse reaction  09/18   . Pravastatin Itching  . Sulfa Antibiotics Itching    REVIEW OF SYSTEMS:   Review of Systems  Unable to perform ROS: Critical illness   MEDICATIONS AT HOME:   Prior to Admission medications   Medication Sig Start Date End Date Taking? Authorizing Provider  clopidogrel (PLAVIX) 75 MG tablet Take 1 tablet (75 mg total) by mouth daily. 07/17/16  Yes Vaughan Basta, MD  diltiazem (DILACOR XR) 180 MG 24 hr capsule Take 180 mg by mouth daily.   Yes [provider]  furosemide (LASIX) 40 MG tablet Take 1 tablet (40 mg total) by mouth daily. Patient taking differently: Take 20 mg by mouth  daily.  07/17/16  Yes Vaughan Basta, MD  gabapentin (NEURONTIN) 300 MG capsule Take 1 capsule by mouth 2 (two) times daily.  01/16/16  Yes [provider]  GLIPIZIDE XL 5 MG 24 hr tablet Take 2 tablets (10 mg total) by mouth daily. Patient taking differently: Take 5 mg by mouth daily.  02/12/17  Yes Max Sane, MD  lacosamide 150 MG TABS Take 1 tablet (150 mg total) by mouth 2 (two) times daily. 03/30/17  Yes Demetrios Loll, MD  losartan-hydrochlorothiazide [facility]) 100-12.5 MG tablet Take 1 tablet by mouth daily.   Yes [provider]  montelukast (SINGULAIR) 10 MG tablet Take 1 tablet by mouth at bedtime. 03/18/16  Yes [provider]  omeprazole (PRILOSEC) 20 MG capsule Take 20 mg by mouth daily.   Yes [provider]  rosuvastatin (CRESTOR) 10 MG tablet Take 10 mg by mouth daily. 04/21/17  Yes [provider]  triamcinolone ointment (KENALOG) 0.1 % USE 1 (ONE) APPLICATION TWO TIMES DAILY 04/21/17  Yes [provider]  albuterol (PROVENTIL HFA;VENTOLIN HFA) 108 (90 BASE) MCG/ACT inhaler Inhale 2 puffs into the lungs every 4 (four) hours as needed for wheezing or shortness of breath.    [provider]  albuterol (PROVENTIL) (2.5 MG/3ML) 0.083% nebulizer solution Take 3 mLs (2.5 mg total) by nebulization every 4 (four) hours as needed for wheezing or shortness of breath. Patient not taking: Reported on 04/29/2017 07/17/16   Vaughan Basta, MD  citalopram (CELEXA) 20 MG tablet Take 1 tablet (20 mg total) by mouth daily. Patient not taking: Reported on 04/29/2017 07/17/16   Vaughan Basta, MD  clindamycin (CLEOCIN) 300 MG capsule Take 2 capsules (600 mg total) by mouth every 8 (eight) hours. Patient not taking: Reported on 04/29/2017 03/30/17   Demetrios Loll, MD  EPINEPHrine 0.3 mg/0.3 mL IJ SOAJ injection Inject into the muscle once.    [provider]  guaiFENesin-dextromethorphan (ROBITUSSIN DM) 100-10 MG/5ML syrup  Take 5 mLs by mouth every 6 (six) hours as needed for cough. 03/30/17   Demetrios Loll, MD  mometasone-formoterol [facility]) 200-5 MCG/ACT AERO Inhale 2 puffs into the lungs 2 (two) times daily. 07/17/16   Vaughan Basta, MD  rOPINIRole (REQUIP) 0.25 MG tablet Take 1 tablet (0.25 mg total) by mouth 3 (three) times daily. 03/24/16   Oswald Hillock, MD      VITAL SIGNS:  There were no vitals taken for this visit.  PHYSICAL EXAMINATION:  Physical Exam  Constitutional: She is well-developed, well-nourished, and in no distress. She appears unhealthy. She appears toxic. She has a sickly appearance.  HENT:  Head: Normocephalic and atraumatic.  NRB mask on  Eyes: Pupils are equal, round, and reactive to light. Conjunctivae and EOM are normal.  Neck: Normal range of motion. Neck supple. No tracheal deviation present.  No thyromegaly present.  Cardiovascular: Normal rate, regular rhythm and normal heart sounds.   Pulmonary/Chest: Breath sounds normal. Accessory muscle usage present. She is in respiratory distress. She has no wheezes. She exhibits no tenderness.  Abdominal: Soft. Bowel sounds are normal. She exhibits no distension. There is no tenderness.  Musculoskeletal: Normal range of motion.  Neurological: No cranial nerve deficit.  Difficult to evaluate due to post-ictal/sedated  Skin: Skin is warm and dry. No rash noted.  Psychiatric: Affect normal.  Difficult to evaluate due to post-ictal/sedated   LABORATORY PANEL:   CBC  Recent Labs Lab 04/29/17 1115  WBC 21.8*  HGB 15.5  HCT 45.6  PLT 316   ------------------------------------------------------------------------------------------------------------------  Chemistries   Recent Labs Lab 04/29/17 1115  NA 140  K 2.1*  CL 95*  CO2 30  GLUCOSE 212*  BUN 27*  CREATININE 1.83*  CALCIUM 9.0  AST 20  ALT 10*  ALKPHOS 94  BILITOT 0.6    ------------------------------------------------------------------------------------------------------------------  Cardiac Enzymes  Recent Labs Lab 04/29/17 1115  TROPONINI 0.04*   ------------------------------------------------------------------------------------------------------------------  RADIOLOGY:  Dg Chest Portable 1 View  Result Date: 04/29/2017 CLINICAL DATA:  Cough EXAM: PORTABLE CHEST 1 VIEW COMPARISON:  Chest radiograph March 29, 2017 and chest CT April 05, 2017 FINDINGS: There is less consolidation in the left lower lobe compared to recent studies. There is a minimal left pleural effusion. Right lung is clear. Heart is upper normal in size with pulmonary vascularity within normal limits. There is aortic atherosclerosis. No adenopathy. No focal bone lesions evident. IMPRESSION: There remains consolidation in the left lower lobe but less than on recent studies consistent with partial clearing of pneumonia from the left lower lobe. There is a minimal left pleural effusion. Lungs elsewhere are clear. Cardiac silhouette is stable. There is aortic atherosclerosis. Aortic Atherosclerosis (ICD10-I70.0). Electronically Signed   By: Lowella Grip III M.D.   On: 04/29/2017 12:00   IMPRESSION AND PLAN:  61 y f with known history of nonalcoholic cirrhosis, COPD, diabetes, hypertension, seizure disorder, deafness who presents for evaluation of shortness of breath and seizure (5-10 en route)  * Acute on chronic resp failure - due to HCAP - may need intubation  - d/w ICU team  * Sepsis - present on admission - due to pna - monitor in ICU  - start sepsis protocol  * HCAP - broad spectrum IV Abx  * Multiple seizures: due to sepsis/hypoxia - On Vimpat at home.  - s/p 15m of Vimpat as a loading dose.  Has received 3 mg Ativan as well.   - d/w Neuro who recommends to Increase maintenance Vimpat to 2045mIV q 12 hours - Ativan prn seizure activity - Seizure  precautions  * Severe hypokalemia - replete and recheck  * ARF - likely prerenal etio - avoid nephrotoxins - aggressive IVF and monitor     All the records are reviewed and case discussed with ED provider. Management plans discussed with the patient, family and they are in agreement.  CODE STATUS: full code  TOTAL TIME (critical care) TAKING CARE OF THIS PATIENT: 45 minutes.    ViMax Sane.D on 04/29/2017 at 1:19 PM  Between 7am to 6pm - Pager - [phone]  After 6pm go to [email or web address] - [email or web address] Physicians [place] [facility]  Office  [phone]: Primary care physician; MaCletis AthensMD   Note: This dictation was prepared with Dragon dictation along with smaller phrase technology. Any transcriptional errors that result  from this process are unintentional.

## 2017-04-30 ENCOUNTER — Inpatient Hospital Stay: Payer: Medicare HMO

## 2017-04-30 DIAGNOSIS — E876 Hypokalemia: Secondary | ICD-10-CM

## 2017-04-30 DIAGNOSIS — G40901 Epilepsy, unspecified, not intractable, with status epilepticus: Secondary | ICD-10-CM

## 2017-04-30 DIAGNOSIS — J189 Pneumonia, unspecified organism: Secondary | ICD-10-CM

## 2017-04-30 DIAGNOSIS — A419 Sepsis, unspecified organism: Principal | ICD-10-CM

## 2017-04-30 LAB — GLUCOSE, CAPILLARY
GLUCOSE-CAPILLARY: 339 mg/dL — AB (ref 65–99)
GLUCOSE-CAPILLARY: 144 mg/dL — AB (ref 65–99)
Glucose-Capillary: 256 mg/dL — ABNORMAL HIGH (ref 65–99)
Glucose-Capillary: 151 mg/dL — ABNORMAL HIGH (ref 65–99)
Glucose-Capillary: 152 mg/dL — ABNORMAL HIGH (ref 65–99)
Glucose-Capillary: 146 mg/dL — ABNORMAL HIGH (ref 65–99)
Glucose-Capillary: 147 mg/dL — ABNORMAL HIGH (ref 65–99)

## 2017-04-30 LAB — COMPREHENSIVE METABOLIC PANEL
ALBUMIN: 2.9 g/dL — AB (ref 3.5–5.0)
ALT: 9 U/L — AB (ref 14–54)
AST: 24 U/L (ref 15–41)
Alkaline Phosphatase: 88 U/L (ref 38–126)
Anion gap: 16 — ABNORMAL HIGH (ref 5–15)
BUN: 23 mg/dL — ABNORMAL HIGH (ref 6–20)
CHLORIDE: 106 mmol/L (ref 101–111)
CO2: 22 mmol/L (ref 22–32)
CREATININE: 1.72 mg/dL — AB (ref 0.44–1.00)
Calcium: 8.2 mg/dL — ABNORMAL LOW (ref 8.9–10.3)
GFR calc non Af Amer: 30 mL/min — ABNORMAL LOW (ref >60)
GFR, EST AFRICAN AMERICAN: 35 mL/min — AB (ref >60)
GLUCOSE: 328 mg/dL — AB (ref 65–99)
Potassium: 2.9 mmol/L — ABNORMAL LOW (ref 3.5–5.1)
SODIUM: 144 mmol/L (ref 135–145)
Total Bilirubin: 0.5 mg/dL (ref 0.3–1.2)
Total Protein: 6 g/dL — ABNORMAL LOW (ref 6.5–8.1)

## 2017-04-30 LAB — MAGNESIUM: Magnesium: 1.6 mg/dL — ABNORMAL LOW (ref 1.7–2.4)

## 2017-04-30 LAB — CBC
HCT: 41.1 % (ref 35.0–47.0)
HEMOGLOBIN: 13.7 g/dL (ref 12.0–16.0)
MCH: 28.1 pg (ref 26.0–34.0)
MCHC: 33.4 g/dL (ref 32.0–36.0)
MCV: 84 fL (ref 80.0–100.0)
PLATELETS: 473 10*3/uL — AB (ref 150–440)
RBC: 4.9 MIL/uL (ref 3.80–5.20)
RDW: 13.6 % (ref 11.5–14.5)
WBC: 28.2 10*3/uL — AB (ref 3.6–11.0)

## 2017-04-30 LAB — LACTIC ACID, PLASMA: LACTIC ACID, VENOUS: 1.8 mmol/L (ref 0.5–1.9)

## 2017-04-30 LAB — PHOSPHORUS: PHOSPHORUS: 3.2 mg/dL (ref 2.5–4.6)

## 2017-04-30 MED ORDER — ENOXAPARIN SODIUM 30 MG/0.3ML ~~LOC~~ SOLN
30.0000 mg | SUBCUTANEOUS | Status: DC
  Administered 2017-04-30: 30 mg via SUBCUTANEOUS
  Filled 2017-04-30: qty 0.3

## 2017-04-30 MED ORDER — POTASSIUM CHLORIDE 2 MEQ/ML IV SOLN
INTRAVENOUS | Status: DC
  Administered 2017-04-30 – 2017-05-01 (×2): via INTRAVENOUS
  Filled 2017-04-30 (×3): qty 1000

## 2017-04-30 MED ORDER — MAGNESIUM SULFATE 2 GM/50ML IV SOLN
2.0000 g | Freq: Once | INTRAVENOUS | Status: AC
  Administered 2017-04-30: 2 g via INTRAVENOUS
  Filled 2017-04-30: qty 50

## 2017-04-30 MED ORDER — DEXTROSE 5 % IV SOLN
2.0000 g | INTRAVENOUS | Status: DC
  Administered 2017-04-30 – 2017-05-02 (×3): 2 g via INTRAVENOUS
  Filled 2017-04-30 (×5): qty 2

## 2017-04-30 MED ORDER — POTASSIUM CHLORIDE 10 MEQ/50ML IV SOLN
10.0000 meq | INTRAVENOUS | Status: AC
Start: 2017-04-30 — End: 2017-04-30
  Administered 2017-04-30 (×4): 10 meq via INTRAVENOUS
  Filled 2017-04-30 (×4): qty 50

## 2017-04-30 MED ORDER — INSULIN ASPART 100 UNIT/ML ~~LOC~~ SOLN
11.0000 [IU] | Freq: Once | SUBCUTANEOUS | Status: AC
  Administered 2017-04-30: 11 [IU] via SUBCUTANEOUS
  Filled 2017-04-30: qty 1

## 2017-04-30 MED ORDER — ALBUTEROL SULFATE (2.5 MG/3ML) 0.083% IN NEBU
2.5000 mg | INHALATION_SOLUTION | RESPIRATORY_TRACT | Status: DC | PRN
  Administered 2017-05-03: 2.5 mg via RESPIRATORY_TRACT
  Filled 2017-04-30: qty 3

## 2017-04-30 MED ORDER — CLOPIDOGREL BISULFATE 75 MG PO TABS
75.0000 mg | ORAL_TABLET | Freq: Every day | ORAL | Status: DC
  Administered 2017-05-05 – 2017-05-07 (×3): 75 mg via ORAL
  Filled 2017-04-30 (×4): qty 1

## 2017-04-30 MED ORDER — ORAL CARE MOUTH RINSE
15.0000 mL | OROMUCOSAL | Status: DC
  Administered 2017-04-30 (×2): 15 mL via OROMUCOSAL

## 2017-04-30 NOTE — Progress Notes (Signed)
[place] [facility] [place] NAME: [name]    MR#:  [id number]  DATE OF BIRTH:  01/03/1951  SUBJECTIVE:  CHIEF COMPLAINT:  Patient was intubated and just extubated in the a.m. arousable was still lethargic  REVIEW OF SYSTEMS:  Review of system unobtainable  DRUG ALLERGIES:   Allergies  Allergen Reactions  . Aspirin Itching  . Celebrex [Celecoxib] Itching    itching  . Ciprofloxacin Itching  . Codeine Itching  . Fosphenytoin Itching  . Levaquin [Levofloxacin In D5w] Itching  . Levofloxacin Itching  . Lovastatin Itching  . Penicillins     Documentation indicates severe reaction  Pt tolerated cephalosporin without adverse reaction 09/18   . Pravastatin Itching  . Sulfa Antibiotics Itching    VITALS:  Blood pressure (!) 96/57, pulse 70, temperature 98.3 F (36.8 C), temperature source Axillary, resp. rate (!) 21, height _0  (1.575 m), weight 74.9 kg (165 lb 2 oz), SpO2 93 %.  PHYSICAL EXAMINATION:  GENERAL:  66 y.o.-year-old patient lying in the bed with no acute distress.  EYES: Pupils equal, round, reactive to light and accommodation. No scleral icterus. Extraocular muscles intact.  HEENT: Head atraumatic, normocephalic. Oropharynx and nasopharynx clear.  NECK:  Supple, no jugular venous distention. No thyroid enlargement, no tenderness.  LUNGS: Normal breath sounds bilaterally, no wheezing, rales,rhonchi or crepitation. No of accessory muscles of respiration.  CARDIOVASCULAR: S1, S2 normal. No murmurs, rubs, or gallops.  ABDOMEN: Soft, nontender, nondistended. Bowel sounds present.  EXTREMITIES: No pedal edema, cyanosis, or clubbing.  NEUROLOGIC: Still lethargic  PSYCHIATRIC: The patient is arousable but lethargic  SKIN: No obvious rash, lesion, or ulcer.    LABORATORY PANEL:   CBC  Recent Labs Lab 04/30/17 0358  WBC 28.2*  HGB 13.7  HCT 41.1  PLT 473*    ------------------------------------------------------------------------------------------------------------------  Chemistries   Recent Labs Lab 04/30/17 0358  NA 144  K 2.9*  CL 106  CO2 22  GLUCOSE 328*  BUN 23*  CREATININE 1.72*  CALCIUM 8.2*  MG 1.6*  AST 24  ALT 9*  ALKPHOS 88  BILITOT 0.5   ------------------------------------------------------------------------------------------------------------------  Cardiac Enzymes  Recent Labs Lab 04/29/17 1115  TROPONINI 0.04*   ------------------------------------------------------------------------------------------------------------------  RADIOLOGY:  Dg Chest 1 View  Result Date: 04/29/2017 CLINICAL DATA:  Feeding tube placement EXAM: CHEST 1 VIEW COMPARISON:  Portable exam 1636 hours compared to 1532 hours FINDINGS: Tip of endotracheal tube projects 3.8 cm above carina. Nasogastric tube extends into abdomen. Normal heart size, mediastinal contours and pulmonary vascularity. Atherosclerotic calcifications aorta. Persistent infiltrate/consolidation LEFT lower lobe Subsegmental atelectasis RIGHT base. Slightly increased markings in RIGHT upper lobe since previous exam cannot exclude developing mild infiltrate. Minimal central peribronchial thickening. No gross pleural effusion or pneumothorax. IMPRESSION: Persistent LEFT lower lobe consolidation consistent with pneumonia. RIGHT basilar atelectasis with question developing mild RIGHT upper lobe infiltrate. Electronically Signed   By: Lavonia Dana M.D.   On: 04/29/2017 16:54   Dg Chest Port 1 View  Result Date: 04/30/2017 CLINICAL DATA:  Respiratory failure. EXAM: PORTABLE CHEST 1 VIEW COMPARISON:  04/29/2017 . FINDINGS: Endotracheal tube, NG tube, left IJ line in stable position. Left lower lobe infiltrate with left-sided pleural effusion. No significant change from prior exam. Slight improvement of left base atelectasis. IMPRESSION: 1.  Lines and tubes in stable position.  2. Left lower lobe infiltrate with left-sided pleural effusion . No significant change from prior exam. Slight improvement of left base atelectasis . Electronically  Signed   By: Marcello Moores  Register   On: 04/30/2017 07:41   Dg Chest Port 1 View  Result Date: 04/29/2017 CLINICAL DATA:  Sepsis. EXAM: PORTABLE CHEST 1 VIEW COMPARISON:  Chest x-ray from same day at 11:33. FINDINGS: Interval placement of an endotracheal tube with the tip in good position approximately 4.2 cm above the level of the carina. Interval placement of an enteric tube with the tip just inside the stomach, and the distal side port in the distal esophagus. Borderline cardiomegaly, unchanged. Normal pulmonary vascularity. Unchanged left lower lobe consolidation with adjacent small left pleural effusion. The right lung remains clear. No acute osseous abnormality. IMPRESSION: 1. Interval placement of an enteric tube with the distal side port in the distal esophagus. Recommend advancement 8-10 cm. 2. Appropriate positioning of the endotracheal tube. 3. Unchanged left lower lobe pneumonia. Electronically Signed   By: Titus Dubin M.D.   On: 04/29/2017 16:09   Dg Chest Portable 1 View  Result Date: 04/29/2017 CLINICAL DATA:  Cough EXAM: PORTABLE CHEST 1 VIEW COMPARISON:  Chest radiograph March 29, 2017 and chest CT April 05, 2017 FINDINGS: There is less consolidation in the left lower lobe compared to recent studies. There is a minimal left pleural effusion. Right lung is clear. Heart is upper normal in size with pulmonary vascularity within normal limits. There is aortic atherosclerosis. No adenopathy. No focal bone lesions evident. IMPRESSION: There remains consolidation in the left lower lobe but less than on recent studies consistent with partial clearing of pneumonia from the left lower lobe. There is a minimal left pleural effusion. Lungs elsewhere are clear. Cardiac silhouette is stable. There is aortic atherosclerosis. Aortic  Atherosclerosis (ICD10-I70.0). Electronically Signed   By: Lowella Grip III M.D.   On: 04/29/2017 12:00   Dg Abd Portable 1v  Result Date: 04/29/2017 CLINICAL DATA:  Nasogastric tube placement EXAM: PORTABLE ABDOMEN - 1 VIEW COMPARISON:  Portable exam 1636 hours compared to 04/29/2017 at 1532 hours FINDINGS: Nasogastric tube is been advanced into the stomach with tip projecting over the mid stomach. Bowel gas pattern normal. No bowel dilatation or bowel wall thickening. Persistent LEFT lower lobe consolidation. Bones demineralized. Surgical clips RIGHT upper quadrant consistent with history of cholecystectomy. IMPRESSION: Tip of nasogastric tube now projects over mid stomach. Nonobstructive bowel gas pattern. LEFT lower lobe consolidation consistent with pneumonia. Electronically Signed   By: Lavonia Dana M.D.   On: 04/29/2017 16:55   Dg Abd Portable 1v  Result Date: 04/29/2017 CLINICAL DATA:  Feeding tube placement.  Smoker. EXAM: PORTABLE ABDOMEN - 1 VIEW COMPARISON:  Abdomen and pelvis CT dated 02/06/2017. FINDINGS: Normal bowel gas pattern. Cholecystectomy clips. Nasogastric tube tip in the proximal stomach and side hole at the gastroesophageal junction. No feeding tube seen. Mild L5-S1 degenerative changes. IMPRESSION: 1. Nasogastric tube tip in the proximal stomach and side hole at the gastroesophageal junction. 2. No feeding tube seen. Electronically Signed   By: Claudie Revering M.D.   On: 04/29/2017 16:06    EKG:   Orders placed or performed during the hospital encounter of 04/29/17  . ED EKG  . ED EKG    ASSESSMENT AND PLAN:   59 y f with known history of nonalcoholic cirrhosis, COPD, diabetes, hypertension, seizure disorder, deafness who presents for evaluation of shortness of breath and seizure (5-10 en route)  * Acute on chronic resp failure secondary to healthcare associated pneumonia -Intubated and status post extubation today a.m. Pulmonology is following  * Sepsis  present  on admission secondary to pneumonia - IV antibiotics and fluids  * HCAP - broad spectrum IV Abx  * Multiple seizures: due to sepsis/hypoxia -Appreciate neurology recommendations - On Vimpat at home.  - s/p 131m of Vimpat as a loading dose. Has received 3 mg Ativan as well.  - d/w Neuro who recommends to Increase maintenance Vimpat to 2049mIV q 12 hours -Ativan prn seizure activity -Seizure precautions  * Severe hypokalemia - replete and recheck  * ARF - likely prerenal etio - avoid nephrotoxins - aggressive IVF and monitor      All the records are reviewed and case discussed with Care Management/Social Workerr. Management plans discussed with the patient's RN and intensivist CODE STATUS: fc   TOTAL TIME TAKING CARE OF THIS PATIENT: 35  minutes.   POSSIBLE D/C IN 2-3 DAYS, DEPENDING ON CLINICAL CONDITION.  Note: This dictation was prepared with Dragon dictation along with smaller phrase technology. Any transcriptional errors that result from this process are unintentional.   GoNicholes Mango.D on 04/30/2017 at 3:34 PM  Between 7am to 6pm - Pager - [phone] 6pm go to [email or web address] - password [email or web address]  Office  [phone]: Primary care physician; MaCletis AthensMD

## 2017-04-30 NOTE — Progress Notes (Signed)
Pharmacy Antibiotic Note  [name] is a 66 y.o. female admitted on 04/29/2017 with sepsis.  Pharmacy has been consulted for cefepime dosing. Patient admitted with respiratory distress and witness seizures. MRSA PCR is negative and Procalcitonin < 0.1. Patient has tolerated cephalosporins on previous admissions.   Plan: Cefepime 2g IV Q24hr with next scheduled dose on 10/25.   Height: _0  (157.5 cm) Weight: 165 lb 2 oz (74.9 kg) IBW/kg (Calculated) : 50.1  Temp (24hrs), Avg:98.1 F (36.7 C), Min:98 F (36.7 C), Max:98.3 F (36.8 C)   Recent Labs Lab 04/29/17 1115 04/29/17 1315 04/29/17 1812 04/30/17 0358  WBC 21.8*  --   --  28.2*  CREATININE 1.83*  --   --  1.72*  LATICACIDVEN 2.1* 1.5 6.5*  --     Estimated Creatinine Clearance: 30.9 mL/min (A) (by C-G formula based on SCr of 1.72 mg/dL (H)).    Allergies  Allergen Reactions  . Aspirin Itching  . Celebrex [Celecoxib] Itching    itching  . Ciprofloxacin Itching  . Codeine Itching  . Fosphenytoin Itching  . Levaquin [Levofloxacin In D5w] Itching  . Levofloxacin Itching  . Lovastatin Itching  . Penicillins     Documentation indicates severe reaction  Pt tolerated cephalosporin without adverse reaction 09/18   . Pravastatin Itching  . Sulfa Antibiotics Itching    Antimicrobials this admission: Vancomycin x 1 in the ED, 10/24.  Cefepime 10/24 >>   Dose adjustments this admission: N/A  Microbiology results: 10/24 BCx: pending  10/24 Sputum: pending 10/24 MRSA PCR: negative   Thank you for allowing pharmacy to be a part of this patient's care.  [name] [name] 04/30/2017 7:57 AM

## 2017-04-30 NOTE — Progress Notes (Signed)
PULMONARY / CRITICAL CARE MEDICINE   Name: [name] MRN: [id number] DOB: 1951/05/18    ADMISSION DATE:  04/29/2017 CONSULTATION DATE: 04/29/2017  REFERRING MD: Dr. Manuella Ghazi   CHIEF COMPLAINT: Seizures   PT PROFILE: 13 F smoker with congenital deafness and seizure disorder admitted with status epilepticus. Intubated for same. Probable LLL PNA  MAJOR EVENTS/TEST RESULTS: 10/24 admission as above 10/24 Neurology consultation 10/25 Extubated  INDWELLING DEVICES:: ETT 10/24 >> 10/25 L IJ CVL 10/25 >>   MICRO DATA: MRSA PCR 10/24 >> NEG Resp 10/24 >>  Blood 10/24 >>   ANTIMICROBIALS:  Vanc 10/24 X 1 Cefepime 10/24 >>     SUBJECTIVE:  Lethargic (on low dose propofol). Coughs with suctioning. Minimal secretions. Passed SBT. Extubated and tolerating. No further seizures   VITAL SIGNS: BP (!) 101/47 (BP Location: Left Arm)   Pulse 94   Temp 98.2 F (36.8 C) (Axillary)   Resp (!) 22   Ht _0  (1.575 m)   Wt 74.9 kg (165 lb 2 oz)   SpO2 92%   BMI 30.20 kg/m   HEMODYNAMICS:    VENTILATOR SETTINGS: Vent Mode: PSV FiO2 (%):  [50 %-100 %] 55 % Set Rate:  [14 bmp] 14 bmp Vt Set:  [500 mL] 500 mL PEEP:  [5 cmH20-10 cmH20] 5 cmH20 Pressure Support:  [10 cmH20] 10 cmH20  INTAKE / OUTPUT: I/O last 3 completed shifts: In: 5036.7 [I.V.:1974.2; IV Piggyback:3062.5] Out: 466 [Urine:955]  PHYSICAL EXAMINATION: General: RASS -2, +/- F/C Neuro: MAEs, PERRL, EOMI HEENT: supple, no JVD Cardiovascular: Reg, no M Lungs: clear anteriorly Abdomen: Soft, +BS Ext: warm, no edema Skin: intact no rashes or lesions  LABS:  BMET  Recent Labs Lab 04/29/17 1115 04/30/17 0358  NA 140 144  K 2.1* 2.9*  CL 95* 106  CO2 30 22  BUN 27* 23*  CREATININE 1.83* 1.72*  GLUCOSE 212* 328*    Electrolytes  Recent Labs Lab 04/29/17 1115 04/29/17 1523 04/30/17 0358  CALCIUM 9.0  --  8.2*  MG  --  1.9 1.6*  PHOS  --  3.5 3.2    CBC  Recent Labs Lab 04/29/17 1115  04/30/17 0358  WBC 21.8* 28.2*  HGB 15.5 13.7  HCT 45.6 41.1  PLT 316 473*    Coag's  Recent Labs Lab 04/29/17 1523  APTT 26  INR 1.03    Sepsis Markers  Recent Labs Lab 04/29/17 1115 04/29/17 1315 04/29/17 1523 04/29/17 1812  LATICACIDVEN 2.1* 1.5  --  6.5*  PROCALCITON  --   --  <0.10  --     ABG  Recent Labs Lab 04/29/17 1500  PHART 7.30*  PCO2ART 56*  PO2ART 290*    Liver Enzymes  Recent Labs Lab 04/29/17 1115 04/30/17 0358  AST 20 24  ALT 10* 9*  ALKPHOS 94 88  BILITOT 0.6 0.5  ALBUMIN 3.4* 2.9*    Cardiac Enzymes  Recent Labs Lab 04/29/17 1115  TROPONINI 0.04*    Glucose  Recent Labs Lab 04/29/17 1452 04/29/17 1639 04/29/17 1954 04/29/17 2326 04/30/17 0354 04/30/17 0714  GLUCAP 302* 314* 325* 312* 339* 256*    CXR: NSC LLL infiltrate and volume loss   ASSESSMENT / PLAN:  PULMONARY A: Acute respiratory failure due to AMS Possible LLL HAP Smoker  Hx: COPD  P:   Extubated today under my direction Supplemental O2 to maintain SpO2 > 90% Continue nebulized bronchodilators  CARDIOVASCULAR A:  Sedation induced hypotension P:  Norepinephrine weaned to  off MAP goal > 60 mmHg   RENAL A:   Lactic Acidosis due to seizures - resolved Hypokalemia AKI  P:   Monitor BMET intermittently Monitor I/Os Correct electrolytes as indicated IVFs adjusted  GASTROINTESTINAL A:   No acute issues  P:   SUP: N/I post extubation NPO post extubation Initiate diet when able to take safely  HEMATOLOGIC A:   No acute issues  P:  DVT px: Enoxaparin  Monitor CBC intermittently Transfuse per usual guidelines   INFECTIOUS A:   Suspected LLL hospital acquired pneumonia  P:   Monitor temp and wbc count Micro and abx as above   ENDOCRINE A:   DM 2  P:   CBG's q 4hrs SSI - change to ACHS after diet initiated  NEUROLOGIC A:   Status Epilepticus   P:   RASS goal: 0   Neurology following and managing anti-convulsants   Seizure precautions      FAMILY  - Updates: No family at bedside   CCM time: 30 mins The above time includes time spent in consultation with patient and/or family members and reviewing care plan on multidisciplinary rounds  Merton Border, MD PCCM service Mobile [phone] Pager [phone]    04/30/2017 11:20 AM

## 2017-04-30 NOTE — Procedures (Signed)
Central Venous Catheter Insertion Procedure Note- Left Internal Jugular KIERA HUSSEY [id number] 02/23/1951  Procedure: Insertion of Central Venous Catheter Indications: Assessment of intravascular volume, Drug and/or fluid administration and Frequent blood sampling  Procedure Details Consent: Unable to obtain consent because of emergent medical necessity. Time Out: Verified patient identification, verified procedure, site/side was marked, verified correct patient position, special equipment/implants available, medications/allergies/relevent history reviewed, required imaging and test results available.  Performed  Maximum sterile technique was used including antiseptics, cap, gloves, gown, hand hygiene, mask and sheet. Skin prep: Chlorhexidine; local anesthetic administered A antimicrobial bonded/coated triple lumen catheter was placed in the left internal jugular vein using the Seldinger technique.  Evaluation Blood flow good Complications: No apparent complications Patient did tolerate procedure well. Chest X-ray ordered to verify placement.  CXR: pending.  Procedure performed under direct ultrasound guidance for real time vessel cannulation.     Pike, MD PCCM service Mobile [phone] Pager [phone] 04/30/2017 11:20 AM

## 2017-04-30 NOTE — Progress Notes (Signed)
Subjective: Patient with two further seizures after admission to the ICU.  Remainder of Vimpat had not been loaded.  Patient given Ativan, intubated and remaining Vimpat administered.  Patient seizure free overnight.  Extubated this morning.    Objective: Current vital signs: BP (!) 132/57   Pulse 95   Temp 98.2 F (36.8 C) (Axillary)   Resp (!) 27   Ht _0  (1.575 m)   Wt 74.9 kg (165 lb 2 oz)   SpO2 97%   BMI 30.20 kg/m  Vital signs in last 24 hours: Temp:  [98 F (36.7 C)-98.3 F (36.8 C)] 98.2 F (36.8 C) (10/25 0800) Pulse Rate:  [50-95] 95 (10/25 0900) Resp:  [14-32] 27 (10/25 0900) BP: (87-178)/(37-88) 132/57 (10/25 0900) SpO2:  [83 %-99 %] 97 % (10/25 0900) FiO2 (%):  [50 %-100 %] 55 % (10/25 0809) Weight:  [74.3 kg (163 lb 12.8 oz)-74.9 kg (165 lb 2 oz)] 74.9 kg (165 lb 2 oz) (10/25 0500)  Intake/Output from previous day: 10/24 0701 - 10/25 0700 In: 5036.7 [I.V.:1974.2; IV Piggyback:3062.5] Out: 955 [Urine:955] Intake/Output this shift: No intake/output data recorded. Nutritional status:    Neurologic Exam: Mental Status: Lethargic.  Opens eyes to painful stimulation.  Not following nonverbal sues.   Cranial Nerves: II: Discs flat bilaterally; Pupils equal, round, reactive to light and accommodation III,IV, VI: Stares off into space and does not focus V,VII: corneals intact bilaterally VIII: deaf IX,X: gag reflex present XI: unable to test XII: unable to test Motor: No extremity movement    Sensory: Responds to noxious stimuli throughout Deep Tendon Reflexes: 2+ and symmetric with sustained clonus in the lower extremities   Lab Results: Basic Metabolic Panel:  Recent Labs Lab 04/29/17 1115 04/29/17 1523 04/30/17 0358  NA 140  --  144  K 2.1*  --  2.9*  CL 95*  --  106  CO2 30  --  22  GLUCOSE 212*  --  328*  BUN 27*  --  23*  CREATININE 1.83*  --  1.72*  CALCIUM 9.0  --  8.2*  MG  --  1.9 1.6*  PHOS  --  3.5 3.2    Liver Function  Tests:  Recent Labs Lab 04/29/17 1115 04/30/17 0358  AST 20 24  ALT 10* 9*  ALKPHOS 94 88  BILITOT 0.6 0.5  PROT 7.0 6.0*  ALBUMIN 3.4* 2.9*   No results for input(s): LIPASE, AMYLASE in the last 168 hours.  Recent Labs Lab 04/29/17 1115  AMMONIA 24    CBC:  Recent Labs Lab 04/29/17 1115 04/30/17 0358  WBC 21.8* 28.2*  NEUTROABS 18.5*  --   HGB 15.5 13.7  HCT 45.6 41.1  MCV 81.7 84.0  PLT 316 473*    Cardiac Enzymes:  Recent Labs Lab 04/29/17 1115  TROPONINI 0.04*    Lipid Panel:  Recent Labs Lab 04/29/17 1523  TRIG 133    CBG:  Recent Labs Lab 04/29/17 1639 04/29/17 1954 04/29/17 2326 04/30/17 0354 04/30/17 0714  GLUCAP 314* 325* 312* 339* 256*    Microbiology: Results for orders placed or performed during the hospital encounter of 04/29/17  Blood culture (routine x 2)     Status: None (Preliminary result)   Collection Time: 04/29/17 12:38 PM  Result Value Ref Range Status   Specimen Description BLOOD RFOA  Final   Special Requests   Final    BOTTLES DRAWN AEROBIC AND ANAEROBIC Blood Culture adequate volume   Culture NO GROWTH <  24 HOURS  Final   Report Status PENDING  Incomplete  Blood culture (routine x 2)     Status: None (Preliminary result)   Collection Time: 04/29/17 12:39 PM  Result Value Ref Range Status   Specimen Description BLOOD RIGHT HAND  Final   Special Requests   Final    BOTTLES DRAWN AEROBIC AND ANAEROBIC Blood Culture adequate volume   Culture NO GROWTH < 24 HOURS  Final   Report Status PENDING  Incomplete  Culture, respiratory (NON-Expectorated)     Status: None (Preliminary result)   Collection Time: 04/29/17  3:00 PM  Result Value Ref Range Status   Specimen Description TRACHEAL ASPIRATE  Final   Special Requests NONE  Final   Gram Stain   Final    MODERATE WBC PRESENT, PREDOMINANTLY PMN FEW GRAM POSITIVE COCCI FEW GRAM POSITIVE RODS Performed at [facility], [place] [address], [place], [place]  [address]    Culture PENDING  Incomplete   Report Status PENDING  Incomplete  MRSA PCR Screening     Status: None   Collection Time: 04/29/17  4:00 PM  Result Value Ref Range Status   MRSA by PCR NEGATIVE NEGATIVE Final    Comment:        The GeneXpert MRSA Assay (FDA approved for NASAL specimens only), is one component of a comprehensive MRSA colonization surveillance program. It is not intended to diagnose MRSA infection nor to guide or monitor treatment for MRSA infections.     Coagulation Studies:  Recent Labs  04/29/17 1523  LABPROT 13.4  INR 1.03    Imaging: Dg Chest 1 View  Result Date: 04/29/2017 CLINICAL DATA:  Feeding tube placement EXAM: CHEST 1 VIEW COMPARISON:  Portable exam 1636 hours compared to 1532 hours FINDINGS: Tip of endotracheal tube projects 3.8 cm above carina. Nasogastric tube extends into abdomen. Normal heart size, mediastinal contours and pulmonary vascularity. Atherosclerotic calcifications aorta. Persistent infiltrate/consolidation LEFT lower lobe Subsegmental atelectasis RIGHT base. Slightly increased markings in RIGHT upper lobe since previous exam cannot exclude developing mild infiltrate. Minimal central peribronchial thickening. No gross pleural effusion or pneumothorax. IMPRESSION: Persistent LEFT lower lobe consolidation consistent with pneumonia. RIGHT basilar atelectasis with question developing mild RIGHT upper lobe infiltrate. Electronically Signed   By: Lavonia Dana M.D.   On: 04/29/2017 16:54   Dg Chest Port 1 View  Result Date: 04/30/2017 CLINICAL DATA:  Respiratory failure. EXAM: PORTABLE CHEST 1 VIEW COMPARISON:  04/29/2017 . FINDINGS: Endotracheal tube, NG tube, left IJ line in stable position. Left lower lobe infiltrate with left-sided pleural effusion. No significant change from prior exam. Slight improvement of left base atelectasis. IMPRESSION: 1.  Lines and tubes in stable position. 2. Left lower lobe infiltrate with left-sided  pleural effusion . No significant change from prior exam. Slight improvement of left base atelectasis . Electronically Signed   By: Marcello Moores  Register   On: 04/30/2017 07:41   Dg Chest Port 1 View  Result Date: 04/29/2017 CLINICAL DATA:  Sepsis. EXAM: PORTABLE CHEST 1 VIEW COMPARISON:  Chest x-ray from same day at 11:33. FINDINGS: Interval placement of an endotracheal tube with the tip in good position approximately 4.2 cm above the level of the carina. Interval placement of an enteric tube with the tip just inside the stomach, and the distal side port in the distal esophagus. Borderline cardiomegaly, unchanged. Normal pulmonary vascularity. Unchanged left lower lobe consolidation with adjacent small left pleural effusion. The right lung remains clear. No acute osseous abnormality.  IMPRESSION: 1. Interval placement of an enteric tube with the distal side port in the distal esophagus. Recommend advancement 8-10 cm. 2. Appropriate positioning of the endotracheal tube. 3. Unchanged left lower lobe pneumonia. Electronically Signed   By: Titus Dubin M.D.   On: 04/29/2017 16:09   Dg Chest Portable 1 View  Result Date: 04/29/2017 CLINICAL DATA:  Cough EXAM: PORTABLE CHEST 1 VIEW COMPARISON:  Chest radiograph March 29, 2017 and chest CT April 05, 2017 FINDINGS: There is less consolidation in the left lower lobe compared to recent studies. There is a minimal left pleural effusion. Right lung is clear. Heart is upper normal in size with pulmonary vascularity within normal limits. There is aortic atherosclerosis. No adenopathy. No focal bone lesions evident. IMPRESSION: There remains consolidation in the left lower lobe but less than on recent studies consistent with partial clearing of pneumonia from the left lower lobe. There is a minimal left pleural effusion. Lungs elsewhere are clear. Cardiac silhouette is stable. There is aortic atherosclerosis. Aortic Atherosclerosis (ICD10-I70.0). Electronically  Signed   By: Lowella Grip III M.D.   On: 04/29/2017 12:00   Dg Abd Portable 1v  Result Date: 04/29/2017 CLINICAL DATA:  Nasogastric tube placement EXAM: PORTABLE ABDOMEN - 1 VIEW COMPARISON:  Portable exam 1636 hours compared to 04/29/2017 at 1532 hours FINDINGS: Nasogastric tube is been advanced into the stomach with tip projecting over the mid stomach. Bowel gas pattern normal. No bowel dilatation or bowel wall thickening. Persistent LEFT lower lobe consolidation. Bones demineralized. Surgical clips RIGHT upper quadrant consistent with history of cholecystectomy. IMPRESSION: Tip of nasogastric tube now projects over mid stomach. Nonobstructive bowel gas pattern. LEFT lower lobe consolidation consistent with pneumonia. Electronically Signed   By: Lavonia Dana M.D.   On: 04/29/2017 16:55   Dg Abd Portable 1v  Result Date: 04/29/2017 CLINICAL DATA:  Feeding tube placement.  Smoker. EXAM: PORTABLE ABDOMEN - 1 VIEW COMPARISON:  Abdomen and pelvis CT dated 02/06/2017. FINDINGS: Normal bowel gas pattern. Cholecystectomy clips. Nasogastric tube tip in the proximal stomach and side hole at the gastroesophageal junction. No feeding tube seen. Mild L5-S1 degenerative changes. IMPRESSION: 1. Nasogastric tube tip in the proximal stomach and side hole at the gastroesophageal junction. 2. No feeding tube seen. Electronically Signed   By: Claudie Revering M.D.   On: 04/29/2017 16:06    Medications:  I have reviewed the patient's current medications. Scheduled: . chlorhexidine gluconate (MEDLINE KIT)  15 mL Mouth Rinse BID  . [START ON 05/01/2017] clopidogrel  75 mg Oral Daily  . enoxaparin (LOVENOX) injection  30 mg Subcutaneous Q24H  . gabapentin  300 mg Oral BID  . insulin aspart  0-15 Units Subcutaneous Q4H  . ipratropium-albuterol  3 mL Nebulization Q6H  . mouth rinse  15 mL Mouth Rinse Q2H    Assessment/Plan: Patient now extubated.  On 248m q 12 hours of Vimpat.  If further seizures will require an  additional anticonvulsant.  Would consider Depakote with a load of 10071mIV and maintenance of 50066mV q 12 hours.    Recommendations: 1.  Continue Vimpat at 200m90m q 12 hours 2.  Continue seizure precautions   LOS: 1 day   LeslAlexis Goodell Neurology [phone]  10:19 AM

## 2017-04-30 NOTE — Progress Notes (Signed)
Inpatient Diabetes Program Recommendations  AACE/ADA: New Consensus Statement on Inpatient Glycemic Control (2015)  Target Ranges:  Prepandial:   less than 140 mg/dL      Peak postprandial:   less than 180 mg/dL (1-2 hours)      Critically ill patients:  140 - 180 mg/dL   Lab Results  Component Value Date   GLUCAP 151 (H) 04/30/2017   HGBA1C 7.9 (H) 07/12/2016    Review of Glycemic Control Results for [name], [name] (MRN [id number]) as of 04/30/2017 13:52  Ref. Range 04/29/2017 23:26 04/30/2017 03:54 04/30/2017 07:14 04/30/2017 11:55 04/30/2017 13:08  Glucose-Capillary Latest Ref Range: 65 - 99 mg/dL 312 (H) 339 (H) 256 (H) 144 (H) 151 (H)    Diabetes history: Type 2 Outpatient Diabetes medications: Glipizide XL 19m qday Current orders for Inpatient glycemic control: Novolog 0-15 units q4h  Inpatient Diabetes Program Recommendations:   Agree with current medications for blood sugar management.    Per ADA recommendations "consider performing an A1C on all patients with diabetes or hyperglycemia admitted to the hospital if not performed in the prior 3 months".  JGentry Fitz RN, BA, MHA, CDE Diabetes Coordinator Inpatient Diabetes Program  [phone](Team Pager) [phone]([place] 04/30/2017 1:55 PM

## 2017-04-30 NOTE — Progress Notes (Signed)
Pt was suctioned prior to extubation for a small amount of thick white secretions. She was extubated without incident to a 3 L nasal cannula.

## 2017-05-01 ENCOUNTER — Inpatient Hospital Stay (HOSPITAL_COMMUNITY): Payer: Medicare HMO

## 2017-05-01 ENCOUNTER — Inpatient Hospital Stay: Payer: Medicare HMO

## 2017-05-01 DIAGNOSIS — G40901 Epilepsy, unspecified, not intractable, with status epilepticus: Secondary | ICD-10-CM

## 2017-05-01 LAB — GLUCOSE, CAPILLARY
GLUCOSE-CAPILLARY: 149 mg/dL — AB (ref 65–99)
GLUCOSE-CAPILLARY: 153 mg/dL — AB (ref 65–99)
GLUCOSE-CAPILLARY: 165 mg/dL — AB (ref 65–99)
GLUCOSE-CAPILLARY: 216 mg/dL — AB (ref 65–99)
Glucose-Capillary: 105 mg/dL — ABNORMAL HIGH (ref 65–99)
Glucose-Capillary: 139 mg/dL — ABNORMAL HIGH (ref 65–99)
Glucose-Capillary: 164 mg/dL — ABNORMAL HIGH (ref 65–99)

## 2017-05-01 LAB — CBC
HCT: 37.6 % (ref 35.0–47.0)
HEMOGLOBIN: 12.8 g/dL (ref 12.0–16.0)
MCH: 28.7 pg (ref 26.0–34.0)
MCHC: 34.1 g/dL (ref 32.0–36.0)
MCV: 84.2 fL (ref 80.0–100.0)
Platelets: 202 10*3/uL (ref 150–440)
RBC: 4.46 MIL/uL (ref 3.80–5.20)
RDW: 13.6 % (ref 11.5–14.5)
WBC: 10 10*3/uL (ref 3.6–11.0)

## 2017-05-01 LAB — BASIC METABOLIC PANEL
ANION GAP: 5 (ref 5–15)
BUN: 21 mg/dL — ABNORMAL HIGH (ref 6–20)
CALCIUM: 8.3 mg/dL — AB (ref 8.9–10.3)
CO2: 27 mmol/L (ref 22–32)
Chloride: 111 mmol/L (ref 101–111)
Creatinine, Ser: 1.1 mg/dL — ABNORMAL HIGH (ref 0.44–1.00)
GFR, EST AFRICAN AMERICAN: 60 mL/min — AB (ref >60)
GFR, EST NON AFRICAN AMERICAN: 52 mL/min — AB (ref >60)
Glucose, Bld: 177 mg/dL — ABNORMAL HIGH (ref 65–99)
Potassium: 4.2 mmol/L (ref 3.5–5.1)
Sodium: 143 mmol/L (ref 135–145)

## 2017-05-01 LAB — MAGNESIUM: MAGNESIUM: 2.2 mg/dL (ref 1.7–2.4)

## 2017-05-01 MED ORDER — VASOPRESSIN 20 UNIT/ML IV SOLN
0.0300 [IU]/min | INTRAVENOUS | Status: DC
  Filled 2017-05-01: qty 2

## 2017-05-01 MED ORDER — HYDRALAZINE HCL 20 MG/ML IJ SOLN
10.0000 mg | INTRAMUSCULAR | Status: DC | PRN
  Administered 2017-05-01: 10 mg via INTRAVENOUS
  Filled 2017-05-01: qty 1

## 2017-05-01 MED ORDER — ENOXAPARIN SODIUM 40 MG/0.4ML ~~LOC~~ SOLN
40.0000 mg | SUBCUTANEOUS | Status: DC
  Administered 2017-05-01 – 2017-05-05 (×5): 40 mg via SUBCUTANEOUS
  Filled 2017-05-01 (×5): qty 0.4

## 2017-05-01 MED ORDER — ORAL CARE MOUTH RINSE
15.0000 mL | Freq: Two times a day (BID) | OROMUCOSAL | Status: DC
  Administered 2017-05-01 – 2017-05-06 (×5): 15 mL via OROMUCOSAL

## 2017-05-01 MED ORDER — HYDROCORTISONE NA SUCCINATE PF 100 MG IJ SOLR
50.0000 mg | Freq: Four times a day (QID) | INTRAMUSCULAR | Status: DC
Start: 2017-05-01 — End: 2017-05-01
  Administered 2017-05-01: 50 mg via INTRAVENOUS
  Filled 2017-05-01: qty 2

## 2017-05-01 NOTE — Progress Notes (Signed)
Pharmacy Antibiotic Note  [name] is a 66 y.o. female admitted on 04/29/2017 with sepsis.  Pharmacy has been consulted for cefepime dosing. Patient admitted with respiratory distress and witness seizures. MRSA PCR is negative and Procalcitonin < 0.1. Patient has tolerated cephalosporins on previous admissions.   Plan:  Continue cefepime 2g IV Q24hr pending culture reports.    Height: _0  (157.5 cm) Weight: 179 lb 7.3 oz (81.4 kg) IBW/kg (Calculated) : 50.1  Temp (24hrs), Avg:98.1 F (36.7 C), Min:97.6 F (36.4 C), Max:98.6 F (37 C)   Recent Labs Lab 04/29/17 1115 04/29/17 1315 04/29/17 1812 04/30/17 0358 04/30/17 1704 05/01/17 0355  WBC 21.8*  --   --  28.2*  --  10.0  CREATININE 1.83*  --   --  1.72*  --  1.10*  LATICACIDVEN 2.1* 1.5 6.5*  --  1.8  --     Estimated Creatinine Clearance: 50.4 mL/min (A) (by C-G formula based on SCr of 1.1 mg/dL (H)).    Allergies  Allergen Reactions  . Aspirin Itching  . Celebrex [Celecoxib] Itching    itching  . Ciprofloxacin Itching  . Codeine Itching  . Fosphenytoin Itching  . Levaquin [Levofloxacin In D5w] Itching  . Levofloxacin Itching  . Lovastatin Itching  . Penicillins     Documentation indicates severe reaction  Pt tolerated cephalosporin without adverse reaction 09/18   . Pravastatin Itching  . Sulfa Antibiotics Itching    Antimicrobials this admission: Vancomycin x 1 in the ED, 10/24.  Cefepime 10/24 >>   Dose adjustments this admission: N/A  Microbiology results: 10/24 BCx: pending  10/24 Sputum: GPC 10/24 MRSA PCR: negative   Thank you for allowing pharmacy to be a part of this patient's care.  Lendon Ka, PharmD Pharmacy Resident 05/01/2017 12:34 PM

## 2017-05-01 NOTE — Progress Notes (Signed)
Patient medication list was analyzed and no clinically relevant interactions were found.   Lendon Ka, PharmD Pharmacy Resident

## 2017-05-01 NOTE — Progress Notes (Signed)
SLP Cancellation Note  Patient Details Name: [name] MRN: [id number] DOB: 1951-01-18   Cancelled treatment:       Reason Eval/Treat Not Completed: Fatigue/lethargy limiting ability to participate (chart reviewed; attempted visit x2. Consulted NSG.) NSG also reported pt was in the process of being transferred off the Unit to the Floor. Due to this and pt's remaining somewhat somnolent (on pressors) though she has had no reported seizure overnight/this morning, ST services will hold on BSE today and f/u in the morning for assessment - pt may be more alert/awake at that time for safe swallowing. Recommend frequent oral care for hygiene and oral stimulation/wetness while NPO. Recommend general aspiration precautions. NSG updated.    Orinda Kenner, MS, CCC-SLP Watson,Katherine 05/01/2017, 1:48 PM

## 2017-05-01 NOTE — Progress Notes (Signed)
PT Cancellation Note  Patient Details Name: [name] MRN: [id number] DOB: 01/20/1951   Cancelled Treatment:    Reason Eval/Treat Not Completed: Patient's level of consciousness Spoke with nursing who reports that the plan was to transfer pt to floor, but that she has been very lethargic.  PT attempted to see pt and she was able to open her eyes and seemed to acknowledge PT's badge, but was unable to keep eyes open.  Did not open eyes again with tapping to attempt to wake, nursing agrees to hold PT today.   Kreg Shropshire, DPT 05/01/2017, 3:01 PM

## 2017-05-01 NOTE — Consult Note (Signed)
PULMONARY / CRITICAL CARE MEDICINE   Name: [name] MRN: [id number] DOB: 04/21/1951    ADMISSION DATE:  04/29/2017 CONSULTATION DATE: 04/29/2017  REFERRING MD: Dr. Manuella Ghazi   CHIEF COMPLAINT: Seizures   HISTORY OF PRESENT ILLNESS:   This is a 66 yo female with a PMH of Stroke, OSA, Seizures, Orthopnea, COPD-home O2 qhs, Neuropathy, Lymph Node Disorder, HTN, Hepatitis, GERD, Heart Murmur, Diabetes Mellitus, Depression, Congenital Deafness (she reads lips), Non-Alcoholic Cirrhosis, and Asthma.  She presented to [facility] ER 10/24 via EMS with respiratory distress and seizures.  Per ER notes the pt had a sizure at home at 11:10 am on 10/24, and then had a second seizure en route to the ER that lasted 5-10 seconds witnessed by EMS. Upon arrival to the ER she had two additional seizures and received a total of 3 mg ativan and was loaded with her home dose of Keppra and Neurology consulted.  Per ER notes the pt has not been taking her antiepileptics for "a while" because they made her sick.  In the ER lab results concerning for possible sepsis and chest xray concerning for possible health acquired pneumonia, therefore sepsis protocol initiated.  She was subsequently admitted to ICU by hospitalist team for further workup and treatment PCCM consulted.  Upon arrival to ICU she required mechanical intubation for airway protection due to multiple seizure activity.      Patient successfully extubated on 10/25 Patient remains lethargic but arousable No acute distress noted Okay to transfer to general medical floor    REVIEW OF SYSTEMS:   Unable to assess pt intubated   VITAL SIGNS: BP 137/71   Pulse 62   Temp 97.6 F (36.4 C)   Resp (!) 23   Ht _0  (1.575 m)   Wt 179 lb 7.3 oz (81.4 kg)   SpO2 91%   BMI 32.82 kg/m   HEMODYNAMICS:    VENTILATOR SETTINGS: Vent Mode: PSV FiO2 (%):  [55 %] 55 % Set Rate:  [14 bmp] 14 bmp Vt Set:  [500 mL] 500 mL PEEP:  [5 cmH20] 5 cmH20 Pressure  Support:  [10 cmH20] 10 cmH20  INTAKE / OUTPUT: I/O last 3 completed shifts: In: 3673.1 [I.V.:2833.1; IV Piggyback:840] Out: 1460 [Urine:1460]  PHYSICAL EXAMINATION: General: No acute distress patient extubated Neuro: Lethargic but arousable PERRL HEENT: supple, no JVD Cardiovascular: sinus rhythm with PVC's, s1s2, no M/R/G Lungs: rhonchi throughout, even, non labored Abdomen: +BS x4, soft, non distended  Musculoskeletal: normal bulk and tone, no edema  Skin: intact no rashes or lesions   LABS:  BMET  Recent Labs Lab 04/29/17 1115 04/30/17 0358 05/01/17 0355  NA 140 144 143  K 2.1* 2.9* 4.2  CL 95* 106 111  CO2 _1 BUN 27* 23* 21*  CREATININE 1.83* 1.72* 1.10*  GLUCOSE 212* 328* 177*    Electrolytes  Recent Labs Lab 04/29/17 1115 04/29/17 1523 04/30/17 0358 05/01/17 0355  CALCIUM 9.0  --  8.2* 8.3*  MG  --  1.9 1.6* 2.2  PHOS  --  3.5 3.2  --     CBC  Recent Labs Lab 04/29/17 1115 04/30/17 0358 05/01/17 0355  WBC 21.8* 28.2* 10.0  HGB 15.5 13.7 12.8  HCT 45.6 41.1 37.6  PLT 316 473* 202    Coag's  Recent Labs Lab 04/29/17 1523  APTT 26  INR 1.03    Sepsis Markers  Recent Labs Lab 04/29/17 1315 04/29/17 1523 04/29/17 1812 04/30/17 1704  LATICACIDVEN 1.5  --  6.5* 1.8  PROCALCITON  --  <0.10  --   --     ABG  Recent Labs Lab 04/29/17 1500  PHART 7.30*  PCO2ART 56*  PO2ART 290*    Liver Enzymes  Recent Labs Lab 04/29/17 1115 04/30/17 0358  AST 20 24  ALT 10* 9*  ALKPHOS 94 88  BILITOT 0.6 0.5  ALBUMIN 3.4* 2.9*    Cardiac Enzymes  Recent Labs Lab 04/29/17 1115  TROPONINI 0.04*    Glucose  Recent Labs Lab 04/30/17 1308 04/30/17 1612 04/30/17 1729 04/30/17 1935 05/01/17 0005 05/01/17 0343  GLUCAP 151* 152* 146* 147* 165* 164*    Imaging Dg Chest Port 1 View  Result Date: 05/01/2017 CLINICAL DATA:  Respiratory failure. EXAM: PORTABLE CHEST 1 VIEW COMPARISON:  04/30/2017. FINDINGS: Interim  removal of endotracheal tube and NG tube. Left IJ line in stable position. Basilar atelectasis. Persistent left lower lobe infiltrate and left-sided pleural effusion. Slight improvement. IMPRESSION: 1. Interim removal of endotracheal tube and NG tube. Left IJ line stable position. 2. Slight improvement of left base infiltrate and left-sided pleural effusion. Bibasilar atelectasis. Electronically Signed   By: Marcello Moores  Register   On: 05/01/2017 06:33   STUDIES:  None  CULTURES: Blood 10/24>> Respiratory 10/24  ANTIBIOTICS: Cefepime 10/24>> Vancomycin 10/24>>  SIGNIFICANT EVENTS: 10/24-Pt admitted to ICU required mechanical intubation upon arrival to ICU   LINES/TUBES: ETT 10/24>>  ASSESSMENT / PLAN:  PULMONARY -Acute respiratory failure secondary to possible HAP   Patient successfully extubated Hx: COPD and Asthma  CARDIOVASCULAR A:  Mildly elevated troponin likely secondary to demand ischemia  P:  Continuous telemetry monitoring Trend troponin's  Hold all outpatient antihypertensives for now   RENAL A:   Lactic Acidosis, Mild-resolved Hypokalemia Acute renal failure  P:   Trend BMP Replace as indicated Monitor UOP Trend lactic acid LR _0  ml/hr   GASTROINTESTINAL Check swallow study speech eval if needed  HEMATOLOGIC A:   No acute issues  P:  DVT px: SCD's  Monitor CBC intermittently Transfuse per usual guidelines   INFECTIOUS A:   Suspected hospital acquired pneumonia  P:   Monitor temp and wbc count Micro and abx as above   ENDOCRINE A:   Diabetes Mellitus  P:   CBG's q4hrs SSI   [facility] Patricia Pesa, M.D.  [facility]  Medical Director [place] Director [place] Department

## 2017-05-01 NOTE — Progress Notes (Signed)
[facility] [place] NAME: [name]    MR#:  [id number]  DATE OF BIRTH:  03-Nov-1950  SUBJECTIVE:  CHIEF COMPLAINT:  Patient was intubated and  extubated yesterday a.m. arousable but still lethargic  REVIEW OF SYSTEMS:  Review of system unobtainable  DRUG ALLERGIES:   Allergies  Allergen Reactions  . Aspirin Itching  . Celebrex [Celecoxib] Itching    itching  . Ciprofloxacin Itching  . Codeine Itching  . Fosphenytoin Itching  . Levaquin [Levofloxacin In D5w] Itching  . Levofloxacin Itching  . Lovastatin Itching  . Penicillins     Documentation indicates severe reaction  Pt tolerated cephalosporin without adverse reaction 09/18   . Pravastatin Itching  . Sulfa Antibiotics Itching    VITALS:  Blood pressure (!) 153/71, pulse 76, temperature 98.7 F (37.1 C), resp. rate (!) 26, height _0  (1.575 m), weight 81.4 kg (179 lb 7.3 oz), SpO2 95 %.  PHYSICAL EXAMINATION:  GENERAL:  66 y.o.-year-old patient lying in the bed with no acute distress.  EYES: Pupils equal, round, reactive to light and accommodation. No scleral icterus. Extraocular muscles intact.  HEENT: Head atraumatic, normocephalic. Oropharynx and nasopharynx clear.  NECK:  Supple, no jugular venous distention. No thyroid enlargement, no tenderness.  LUNGS: Normal breath sounds bilaterally, no wheezing, rales,rhonchi or crepitation. No of accessory muscles of respiration.  CARDIOVASCULAR: S1, S2 normal. No murmurs, rubs, or gallops.  ABDOMEN: Soft, nontender, nondistended. Bowel sounds present.  EXTREMITIES: No pedal edema, cyanosis, or clubbing.  NEUROLOGIC: Still lethargic  PSYCHIATRIC: The patient is arousable but lethargic  SKIN: No obvious rash, lesion, or ulcer.    LABORATORY PANEL:   CBC  Recent Labs Lab 05/01/17 0355  WBC 10.0  HGB 12.8  HCT 37.6  PLT 202    ------------------------------------------------------------------------------------------------------------------  Chemistries   Recent Labs Lab 04/30/17 0358 05/01/17 0355  NA 144 143  K 2.9* 4.2  CL 106 111  CO2 22 27  GLUCOSE 328* 177*  BUN 23* 21*  CREATININE 1.72* 1.10*  CALCIUM 8.2* 8.3*  MG 1.6* 2.2  AST 24  --   ALT 9*  --   ALKPHOS 88  --   BILITOT 0.5  --    ------------------------------------------------------------------------------------------------------------------  Cardiac Enzymes  Recent Labs Lab 04/29/17 1115  TROPONINI 0.04*   ------------------------------------------------------------------------------------------------------------------  RADIOLOGY:  Dg Chest 1 View  Result Date: 04/29/2017 CLINICAL DATA:  Feeding tube placement EXAM: CHEST 1 VIEW COMPARISON:  Portable exam 1636 hours compared to 1532 hours FINDINGS: Tip of endotracheal tube projects 3.8 cm above carina. Nasogastric tube extends into abdomen. Normal heart size, mediastinal contours and pulmonary vascularity. Atherosclerotic calcifications aorta. Persistent infiltrate/consolidation LEFT lower lobe Subsegmental atelectasis RIGHT base. Slightly increased markings in RIGHT upper lobe since previous exam cannot exclude developing mild infiltrate. Minimal central peribronchial thickening. No gross pleural effusion or pneumothorax. IMPRESSION: Persistent LEFT lower lobe consolidation consistent with pneumonia. RIGHT basilar atelectasis with question developing mild RIGHT upper lobe infiltrate. Electronically Signed   By: Lavonia Dana M.D.   On: 04/29/2017 16:54   Dg Chest Port 1 View  Result Date: 05/01/2017 CLINICAL DATA:  Respiratory failure. EXAM: PORTABLE CHEST 1 VIEW COMPARISON:  04/30/2017. FINDINGS: Interim removal of endotracheal tube and NG tube. Left IJ line in stable position. Basilar atelectasis. Persistent left lower lobe infiltrate and left-sided pleural effusion. Slight  improvement. IMPRESSION: 1. Interim removal of endotracheal tube and NG tube. Left IJ line stable position. 2. Slight improvement  of left base infiltrate and left-sided pleural effusion. Bibasilar atelectasis. Electronically Signed   By: Marcello Moores  Register   On: 05/01/2017 06:33   Dg Chest Port 1 View  Result Date: 04/30/2017 CLINICAL DATA:  Respiratory failure. EXAM: PORTABLE CHEST 1 VIEW COMPARISON:  04/29/2017 . FINDINGS: Endotracheal tube, NG tube, left IJ line in stable position. Left lower lobe infiltrate with left-sided pleural effusion. No significant change from prior exam. Slight improvement of left base atelectasis. IMPRESSION: 1.  Lines and tubes in stable position. 2. Left lower lobe infiltrate with left-sided pleural effusion . No significant change from prior exam. Slight improvement of left base atelectasis . Electronically Signed   By: Marcello Moores  Register   On: 04/30/2017 07:41   Dg Abd Portable 1v  Result Date: 04/29/2017 CLINICAL DATA:  Nasogastric tube placement EXAM: PORTABLE ABDOMEN - 1 VIEW COMPARISON:  Portable exam 1636 hours compared to 04/29/2017 at 1532 hours FINDINGS: Nasogastric tube is been advanced into the stomach with tip projecting over the mid stomach. Bowel gas pattern normal. No bowel dilatation or bowel wall thickening. Persistent LEFT lower lobe consolidation. Bones demineralized. Surgical clips RIGHT upper quadrant consistent with history of cholecystectomy. IMPRESSION: Tip of nasogastric tube now projects over mid stomach. Nonobstructive bowel gas pattern. LEFT lower lobe consolidation consistent with pneumonia. Electronically Signed   By: Lavonia Dana M.D.   On: 04/29/2017 16:55    EKG:   Orders placed or performed during the hospital encounter of 04/29/17  . ED EKG  . ED EKG    ASSESSMENT AND PLAN:   49 y f with known history of nonalcoholic cirrhosis, COPD, diabetes, hypertension, seizure disorder, deafness who presents for evaluation of shortness of  breath and seizure (5-10 en route)  * Acute on chronic resp failure secondary to healthcare associated pneumonia -Intubated and status post extubation  Pulmonology is following  * Sepsis present on admission secondary to pneumonia - IV antibiotics and fluids  * HCAP - broad spectrum IV Abx  * Multiple seizures: due to sepsis/hypoxia -Appreciate neurology recommendations - On Vimpat at home.  - s/p 151m of Vimpat as a loading dose. Has received 3 mg Ativan as well.  -  Vimpat to 2039mIV q 12 hours -EEG ordered, considering LP and acyclovir -Ativan prn seizure activity -Seizure precautions  * Severe hypokalemia - replete and recheck  * ARF - likely prerenal etio, creatinine at 1.10 - avoid nephrotoxins - aggressive IVF and monitor      All the records are reviewed and case discussed with Care Management/Social Workerr. Management plans discussed with the patient's RN and intensivist CODE STATUS: fc   TOTAL TIME TAKING CARE OF THIS PATIENT: 35  minutes.   POSSIBLE D/C IN 2-3 DAYS, DEPENDING ON CLINICAL CONDITION.  Note: This dictation was prepared with Dragon dictation along with smaller phrase technology. Any transcriptional errors that result from this process are unintentional.   GoNicholes Mango.D on 05/01/2017 at 4:42 PM  Between 7am to 6pm - Pager - [phone] 6pm go to [email or web address] - password [email or web address]  Office  [phone]: Primary care physician; MaCletis AthensMD

## 2017-05-01 NOTE — Progress Notes (Deleted)
Subjective: Still somnolent and on pressors. No reported seizure overnight.   Objective: Current vital signs: BP (!) 152/68   Pulse 82   Temp 98.7 F (37.1 C)   Resp (!) 24   Ht _0  (1.575 m)   Wt 81.4 kg (179 lb 7.3 oz)   SpO2 94%   BMI 32.82 kg/m  Vital signs in last 24 hours: Temp:  [97.6 F (36.4 C)-98.7 F (37.1 C)] 98.7 F (37.1 C) (10/26 1200) Pulse Rate:  [59-82] 82 (10/26 1200) Resp:  [18-29] 24 (10/26 1200) BP: (95-152)/(48-73) 152/68 (10/26 1200) SpO2:  [87 %-98 %] 94 % (10/26 1200) Weight:  [81.4 kg (179 lb 7.3 oz)] 81.4 kg (179 lb 7.3 oz) (10/26 0354)  Intake/Output from previous day: 10/25 0701 - 10/26 0700 In: 1381.2 [I.V.:1086.2; IV Piggyback:295] Out: 4081 [Urine:1155] Intake/Output this shift: Total I/O In: -  Out: 500 [Urine:500] Nutritional status:    Neurologic Exam: Mental Status: Lethargic.  Opens eyes to painful stimulation.  Not following nonverbal sues.   Cranial Nerves: II: Discs flat bilaterally; Pupils equal, round, reactive to light and accommodation III,IV, VI: Stares off into space and does not focus V,VII: corneals intact bilaterally VIII: deaf IX,X: gag reflex present XI: unable to test XII: unable to test Motor: No extremity movement    Sensory: Responds to noxious stimuli throughout Deep Tendon Reflexes: 2+ and symmetric with sustained clonus in the lower extremities   Lab Results: Basic Metabolic Panel:  Recent Labs Lab 04/29/17 1115 04/29/17 1523 04/30/17 0358 05/01/17 0355  NA 140  --  144 143  K 2.1*  --  2.9* 4.2  CL 95*  --  106 111  CO2 30  --  22 27  GLUCOSE 212*  --  328* 177*  BUN 27*  --  23* 21*  CREATININE 1.83*  --  1.72* 1.10*  CALCIUM 9.0  --  8.2* 8.3*  MG  --  1.9 1.6* 2.2  PHOS  --  3.5 3.2  --     Liver Function Tests:  Recent Labs Lab 04/29/17 1115 04/30/17 0358  AST 20 24  ALT 10* 9*  ALKPHOS 94 88  BILITOT 0.6 0.5  PROT 7.0 6.0*  ALBUMIN 3.4* 2.9*   No results for  input(s): LIPASE, AMYLASE in the last 168 hours.  Recent Labs Lab 04/29/17 1115  AMMONIA 24    CBC:  Recent Labs Lab 04/29/17 1115 04/30/17 0358 05/01/17 0355  WBC 21.8* 28.2* 10.0  NEUTROABS 18.5*  --   --   HGB 15.5 13.7 12.8  HCT 45.6 41.1 37.6  MCV 81.7 84.0 84.2  PLT 316 473* 202    Cardiac Enzymes:  Recent Labs Lab 04/29/17 1115  TROPONINI 0.04*    Lipid Panel:  Recent Labs Lab 04/29/17 1523  TRIG 133    CBG:  Recent Labs Lab 04/30/17 1935 05/01/17 0005 05/01/17 0343 05/01/17 0749 05/01/17 1143  GLUCAP 147* 165* 164* 153* 149*    Microbiology: Results for orders placed or performed during the hospital encounter of 04/29/17  Blood culture (routine x 2)     Status: None (Preliminary result)   Collection Time: 04/29/17 12:38 PM  Result Value Ref Range Status   Specimen Description BLOOD RFOA  Final   Special Requests   Final    BOTTLES DRAWN AEROBIC AND ANAEROBIC Blood Culture adequate volume   Culture NO GROWTH 2 DAYS  Final   Report Status PENDING  Incomplete  Blood culture (routine x 2)  Status: None (Preliminary result)   Collection Time: 04/29/17 12:39 PM  Result Value Ref Range Status   Specimen Description BLOOD RIGHT HAND  Final   Special Requests   Final    BOTTLES DRAWN AEROBIC AND ANAEROBIC Blood Culture adequate volume   Culture NO GROWTH 2 DAYS  Final   Report Status PENDING  Incomplete  Culture, respiratory (NON-Expectorated)     Status: None (Preliminary result)   Collection Time: 04/29/17  3:00 PM  Result Value Ref Range Status   Specimen Description TRACHEAL ASPIRATE  Final   Special Requests NONE  Final   Gram Stain   Final    MODERATE WBC PRESENT, PREDOMINANTLY PMN FEW GRAM POSITIVE COCCI FEW GRAM POSITIVE RODS    Culture   Final    CULTURE REINCUBATED FOR BETTER GROWTH Performed at [facility], [place] [address], [place], [place] [address]    Report Status PENDING  Incomplete  MRSA PCR Screening      Status: None   Collection Time: 04/29/17  4:00 PM  Result Value Ref Range Status   MRSA by PCR NEGATIVE NEGATIVE Final    Comment:        The GeneXpert MRSA Assay (FDA approved for NASAL specimens only), is one component of a comprehensive MRSA colonization surveillance program. It is not intended to diagnose MRSA infection nor to guide or monitor treatment for MRSA infections.     Coagulation Studies:  Recent Labs  04/29/17 1523  LABPROT 13.4  INR 1.03    Imaging: Dg Chest 1 View  Result Date: 04/29/2017 CLINICAL DATA:  Feeding tube placement EXAM: CHEST 1 VIEW COMPARISON:  Portable exam 1636 hours compared to 1532 hours FINDINGS: Tip of endotracheal tube projects 3.8 cm above carina. Nasogastric tube extends into abdomen. Normal heart size, mediastinal contours and pulmonary vascularity. Atherosclerotic calcifications aorta. Persistent infiltrate/consolidation LEFT lower lobe Subsegmental atelectasis RIGHT base. Slightly increased markings in RIGHT upper lobe since previous exam cannot exclude developing mild infiltrate. Minimal central peribronchial thickening. No gross pleural effusion or pneumothorax. IMPRESSION: Persistent LEFT lower lobe consolidation consistent with pneumonia. RIGHT basilar atelectasis with question developing mild RIGHT upper lobe infiltrate. Electronically Signed   By: Lavonia Dana M.D.   On: 04/29/2017 16:54   Dg Chest Port 1 View  Result Date: 05/01/2017 CLINICAL DATA:  Respiratory failure. EXAM: PORTABLE CHEST 1 VIEW COMPARISON:  04/30/2017. FINDINGS: Interim removal of endotracheal tube and NG tube. Left IJ line in stable position. Basilar atelectasis. Persistent left lower lobe infiltrate and left-sided pleural effusion. Slight improvement. IMPRESSION: 1. Interim removal of endotracheal tube and NG tube. Left IJ line stable position. 2. Slight improvement of left base infiltrate and left-sided pleural effusion. Bibasilar atelectasis. Electronically  Signed   By: Marcello Moores  Register   On: 05/01/2017 06:33   Dg Chest Port 1 View  Result Date: 04/30/2017 CLINICAL DATA:  Respiratory failure. EXAM: PORTABLE CHEST 1 VIEW COMPARISON:  04/29/2017 . FINDINGS: Endotracheal tube, NG tube, left IJ line in stable position. Left lower lobe infiltrate with left-sided pleural effusion. No significant change from prior exam. Slight improvement of left base atelectasis. IMPRESSION: 1.  Lines and tubes in stable position. 2. Left lower lobe infiltrate with left-sided pleural effusion . No significant change from prior exam. Slight improvement of left base atelectasis . Electronically Signed   By: Marcello Moores  Register   On: 04/30/2017 07:41   Dg Chest Port 1 View  Result Date: 04/29/2017 CLINICAL DATA:  Sepsis. EXAM: PORTABLE CHEST 1  VIEW COMPARISON:  Chest x-ray from same day at 11:33. FINDINGS: Interval placement of an endotracheal tube with the tip in good position approximately 4.2 cm above the level of the carina. Interval placement of an enteric tube with the tip just inside the stomach, and the distal side port in the distal esophagus. Borderline cardiomegaly, unchanged. Normal pulmonary vascularity. Unchanged left lower lobe consolidation with adjacent small left pleural effusion. The right lung remains clear. No acute osseous abnormality. IMPRESSION: 1. Interval placement of an enteric tube with the distal side port in the distal esophagus. Recommend advancement 8-10 cm. 2. Appropriate positioning of the endotracheal tube. 3. Unchanged left lower lobe pneumonia. Electronically Signed   By: Titus Dubin M.D.   On: 04/29/2017 16:09   Dg Abd Portable 1v  Result Date: 04/29/2017 CLINICAL DATA:  Nasogastric tube placement EXAM: PORTABLE ABDOMEN - 1 VIEW COMPARISON:  Portable exam 1636 hours compared to 04/29/2017 at 1532 hours FINDINGS: Nasogastric tube is been advanced into the stomach with tip projecting over the mid stomach. Bowel gas pattern normal. No bowel  dilatation or bowel wall thickening. Persistent LEFT lower lobe consolidation. Bones demineralized. Surgical clips RIGHT upper quadrant consistent with history of cholecystectomy. IMPRESSION: Tip of nasogastric tube now projects over mid stomach. Nonobstructive bowel gas pattern. LEFT lower lobe consolidation consistent with pneumonia. Electronically Signed   By: Lavonia Dana M.D.   On: 04/29/2017 16:55   Dg Abd Portable 1v  Result Date: 04/29/2017 CLINICAL DATA:  Feeding tube placement.  Smoker. EXAM: PORTABLE ABDOMEN - 1 VIEW COMPARISON:  Abdomen and pelvis CT dated 02/06/2017. FINDINGS: Normal bowel gas pattern. Cholecystectomy clips. Nasogastric tube tip in the proximal stomach and side hole at the gastroesophageal junction. No feeding tube seen. Mild L5-S1 degenerative changes. IMPRESSION: 1. Nasogastric tube tip in the proximal stomach and side hole at the gastroesophageal junction. 2. No feeding tube seen. Electronically Signed   By: Claudie Revering M.D.   On: 04/29/2017 16:06    Medications:  I have reviewed the patient's current medications. Scheduled: . clopidogrel  75 mg Oral Daily  . enoxaparin (LOVENOX) injection  40 mg Subcutaneous Q24H  . gabapentin  300 mg Oral BID  . hydrocortisone sod succinate (SOLU-CORTEF) inj  50 mg Intravenous Q6H  . insulin aspart  0-15 Units Subcutaneous Q4H  . ipratropium-albuterol  3 mL Nebulization Q6H  . mouth rinse  15 mL Mouth Rinse BID    Assessment/Plan:  Poorly cooperative Patient now extubated.  On 233m q 12 hours of Vimpat.  No further seizures reported.   Will get EEG today to make sure no seizures and nothing coming out of her temporal lobes.  Would consider LP as hypotensive with possible infection and no source Agree of Acyclovir until CSF comes back negative.   05/01/2017  12:53 PM

## 2017-05-01 NOTE — Progress Notes (Signed)
Interpreter came to sign with patient to translate today. Patient unable to focus and keep eyes open continuously to be able to see translator. Hinton Dyer PA aware. Will keep patient in ICU for continued monitoring verses sending to the floor.

## 2017-05-02 LAB — BASIC METABOLIC PANEL
Anion gap: 7 (ref 5–15)
BUN: 21 mg/dL — AB (ref 6–20)
CALCIUM: 8.8 mg/dL — AB (ref 8.9–10.3)
CHLORIDE: 111 mmol/L (ref 101–111)
CO2: 28 mmol/L (ref 22–32)
CREATININE: 0.69 mg/dL (ref 0.44–1.00)
GFR calc non Af Amer: >60 mL/min (ref >60)
GLUCOSE: 124 mg/dL — AB (ref 65–99)
Potassium: 3.4 mmol/L — ABNORMAL LOW (ref 3.5–5.1)
Sodium: 146 mmol/L — ABNORMAL HIGH (ref 135–145)

## 2017-05-02 LAB — CULTURE, RESPIRATORY W GRAM STAIN: Culture: NORMAL

## 2017-05-02 LAB — PHOSPHORUS: Phosphorus: 2.2 mg/dL — ABNORMAL LOW (ref 2.5–4.6)

## 2017-05-02 LAB — GLUCOSE, CAPILLARY
GLUCOSE-CAPILLARY: 153 mg/dL — AB (ref 65–99)
GLUCOSE-CAPILLARY: 139 mg/dL — AB (ref 65–99)
Glucose-Capillary: 139 mg/dL — ABNORMAL HIGH (ref 65–99)
Glucose-Capillary: 161 mg/dL — ABNORMAL HIGH (ref 65–99)
Glucose-Capillary: 139 mg/dL — ABNORMAL HIGH (ref 65–99)

## 2017-05-02 LAB — CULTURE, RESPIRATORY

## 2017-05-02 LAB — MAGNESIUM: Magnesium: 1.9 mg/dL (ref 1.7–2.4)

## 2017-05-02 MED ORDER — ONDANSETRON HCL 4 MG/2ML IJ SOLN
4.0000 mg | Freq: Four times a day (QID) | INTRAMUSCULAR | Status: DC | PRN
  Administered 2017-05-02 – 2017-05-05 (×2): 4 mg via INTRAVENOUS
  Filled 2017-05-02: qty 2

## 2017-05-02 MED ORDER — SODIUM CHLORIDE 0.9 % IV SOLN
INTRAVENOUS | Status: DC
  Administered 2017-05-02: 13:00:00 via INTRAVENOUS

## 2017-05-02 MED ORDER — POTASSIUM CHLORIDE 10 MEQ/100ML IV SOLN
10.0000 meq | INTRAVENOUS | Status: AC
  Administered 2017-05-02 (×2): 10 meq via INTRAVENOUS
  Filled 2017-05-02 (×2): qty 100

## 2017-05-02 MED ORDER — ONDANSETRON HCL 4 MG/2ML IJ SOLN
INTRAMUSCULAR | Status: AC
  Administered 2017-05-02: 4 mg via INTRAVENOUS
  Filled 2017-05-02: qty 2

## 2017-05-02 NOTE — Progress Notes (Signed)
Patient ID: [name], female   DOB: 1950-10-14, 66 y.o.   MRN: [id number]  [place] PROGRESS NOTE  [name]:[id number] DOB: 06-17-1951 DOA: 04/29/2017 PCP: Cletis Athens, MD  HPI/Subjective: Patient awakened with a sternal rub.  With a sign language interpreter we were unable to get much response from her.  She did wave goodbye when we were leaving the room.  We then asked her some more questions and did not get much of a response. Family stated she was recently started on oxygen.  Family states that she also stopped her seizure medications.  She does live alone.  Objective: Vitals:   05/02/17 0800 05/02/17 1134  BP: (!) 151/75 (!) 146/73  Pulse: 85 96  Resp: (!) 35 16  Temp: 99.3 F (37.4 C) 99.4 F (37.4 C)  SpO2: 92% 90%    Filed Weights   04/29/17 1446 04/30/17 0500 05/01/17 0354  Weight: 74.3 kg (163 lb 12.8 oz) 74.9 kg (165 lb 2 oz) 81.4 kg (179 lb 7.3 oz)    ROS: Review of Systems  Unable to perform ROS: Acuity of condition   Exam: Physical Exam  Constitutional: She appears lethargic.  HENT:  Nose: No mucosal edema.  Throat dry  Eyes: Pupils are equal, round, and reactive to light. Conjunctivae and lids are normal.  Neck: Carotid bruit is not present. No thyromegaly present.  Cardiovascular: Regular rhythm, S1 normal, S2 normal and normal heart sounds.   Respiratory: She has no decreased breath sounds. She has no wheezes. She has rhonchi in the right lower field and the left lower field.  GI: Soft. Bowel sounds are normal. There is no tenderness.  Musculoskeletal:       Right ankle: She exhibits no swelling.       Left ankle: She exhibits no swelling.  Neurological: She appears lethargic.  Moves her extremities on her own  Skin: Skin is warm. No rash noted.  Psychiatric:  Awakened with sternal rub      Data Reviewed: Basic Metabolic Panel:  Recent Labs Lab 04/29/17 1115 04/29/17 1523 04/30/17 0358 05/01/17 0355 05/02/17 0530   NA 140  --  144 143 146*  K 2.1*  --  2.9* 4.2 3.4*  CL 95*  --  106 111 111  CO2 30  --  _0 GLUCOSE 212*  --  328* 177* 124*  BUN 27*  --  23* 21* 21*  CREATININE 1.83*  --  1.72* 1.10* 0.69  CALCIUM 9.0  --  8.2* 8.3* 8.8*  MG  --  1.9 1.6* 2.2 1.9  PHOS  --  3.5 3.2  --  2.2*   Liver Function Tests:  Recent Labs Lab 04/29/17 1115 04/30/17 0358  AST 20 24  ALT 10* 9*  ALKPHOS 94 88  BILITOT 0.6 0.5  PROT 7.0 6.0*  ALBUMIN 3.4* 2.9*    Recent Labs Lab 04/29/17 1115  AMMONIA 24   CBC:  Recent Labs Lab 04/29/17 1115 04/30/17 0358 05/01/17 0355  WBC 21.8* 28.2* 10.0  NEUTROABS 18.5*  --   --   HGB 15.5 13.7 12.8  HCT 45.6 41.1 37.6  MCV 81.7 84.0 84.2  PLT 316 473* 202   Cardiac Enzymes:  Recent Labs Lab 04/29/17 1115  TROPONINI 0.04*   BNP (last 3 results)  Recent Labs  02/06/17 1109 03/24/17 1738  BNP 50.0 184.0*     CBG:  Recent Labs Lab 05/01/17 1619 05/01/17 1949 05/01/17 2342 05/02/17 0343  05/02/17 0811  GLUCAP 216* 139* 105* 139* 161*    Recent Results (from the past 240 hour(s))  Blood culture (routine x 2)     Status: None (Preliminary result)   Collection Time: 04/29/17 12:38 PM  Result Value Ref Range Status   Specimen Description BLOOD RFOA  Final   Special Requests   Final    BOTTLES DRAWN AEROBIC AND ANAEROBIC Blood Culture adequate volume   Culture NO GROWTH 3 DAYS  Final   Report Status PENDING  Incomplete  Blood culture (routine x 2)     Status: None (Preliminary result)   Collection Time: 04/29/17 12:39 PM  Result Value Ref Range Status   Specimen Description BLOOD RIGHT HAND  Final   Special Requests   Final    BOTTLES DRAWN AEROBIC AND ANAEROBIC Blood Culture adequate volume   Culture NO GROWTH 3 DAYS  Final   Report Status PENDING  Incomplete  Culture, respiratory (NON-Expectorated)     Status: None (Preliminary result)   Collection Time: 04/29/17  3:00 PM  Result Value Ref Range Status   Specimen  Description TRACHEAL ASPIRATE  Final   Special Requests NONE  Final   Gram Stain   Final    MODERATE WBC PRESENT, PREDOMINANTLY PMN FEW GRAM POSITIVE COCCI FEW GRAM POSITIVE RODS    Culture   Final    CULTURE REINCUBATED FOR BETTER GROWTH Performed at [facility], [place] [address], [place], [place] [address]    Report Status PENDING  Incomplete  MRSA PCR Screening     Status: None   Collection Time: 04/29/17  4:00 PM  Result Value Ref Range Status   MRSA by PCR NEGATIVE NEGATIVE Final    Comment:        The GeneXpert MRSA Assay (FDA approved for NASAL specimens only), is one component of a comprehensive MRSA colonization surveillance program. It is not intended to diagnose MRSA infection nor to guide or monitor treatment for MRSA infections.      Studies: Dg Chest Port 1 View  Result Date: 05/01/2017 CLINICAL DATA:  Respiratory failure. EXAM: PORTABLE CHEST 1 VIEW COMPARISON:  04/30/2017. FINDINGS: Interim removal of endotracheal tube and NG tube. Left IJ line in stable position. Basilar atelectasis. Persistent left lower lobe infiltrate and left-sided pleural effusion. Slight improvement. IMPRESSION: 1. Interim removal of endotracheal tube and NG tube. Left IJ line stable position. 2. Slight improvement of left base infiltrate and left-sided pleural effusion. Bibasilar atelectasis. Electronically Signed   By: Marcello Moores  Register   On: 05/01/2017 06:33    Scheduled Meds: . clopidogrel  75 mg Oral Daily  . enoxaparin (LOVENOX) injection  40 mg Subcutaneous Q24H  . gabapentin  300 mg Oral BID  . insulin aspart  0-15 Units Subcutaneous Q4H  . ipratropium-albuterol  3 mL Nebulization Q6H  . mouth rinse  15 mL Mouth Rinse BID   Continuous Infusions: . ceFEPime (MAXIPIME) IV 2 g (05/02/17 1058)  . lacosamide (VIMPAT) IV 200 mg (05/02/17 1115)    Assessment/Plan:  1. Clinical sepsis and left lower lobe pneumonia.  On Maxipime. 2. Chronic respiratory failure with hypoxia  on oxygen.  Status post extubation. 3. Acute encephalopathy likely from prolonged seizure.  EEG showing triphasic waves which is likely metabolic in nature. 4. Seizure disorder.  Patient stopped her seizure medications.  On IV Vimpat while here 5. Today failed swallow evaluation.  Currently n.p.o. start gentle IV fluid hydration 6. Type 2 diabetes mellitus on sliding scale 7.  History of cirrhosis 8. Lactic acidosis improved with IV fluids. 9. Acute kidney injury.  Improved with IV fluids. 10. Hypokalemia replace potassium IV  Code Status:     Code Status Orders        Start     Ordered   04/29/17 1915  Full code  Continuous     04/29/17 1915    Code Status History    Date Active Date Inactive Code Status Order ID Comments User Context   03/25/2017  2:35 AM 03/30/2017  7:07 PM Full Code [id number]  Hugelmeyer, Ubaldo Glassing, DO Inpatient   02/11/2017  9:31 PM 02/12/2017  4:36 PM Full Code [id number]  Demetrios Loll, MD Inpatient   07/11/2016 11:31 AM 07/17/2016  3:08 PM Partial Code [id number]  Marijo Conception, RN Inpatient   07/11/2016  2:37 AM 07/11/2016 11:31 AM Full Code [id number]  Harvie Bridge, DO Inpatient   03/22/2016 10:44 PM 03/24/2016  7:12 PM Partial Code [id number]  Toy Baker, MD Inpatient   03/29/2015 11:30 AM 03/31/2015  2:21 PM Partial Code [id number]  Loletha Grayer, MD ED     Family Communication: Spoke with family on the phone Disposition Plan: To be determined  Consultants:  Neurology  Antibiotics:  Maxipime  Time spent: 28 minutes  [place], [place]

## 2017-05-02 NOTE — Consult Note (Signed)
PULMONARY / CRITICAL CARE MEDICINE   Name: MANEH SIEBEN MRN: [id number] DOB: [birth year]    ADMISSION DATE:  04/29/2017 CONSULTATION DATE: 04/29/2017  REFERRING MD: Dr. Manuella Ghazi   CHIEF COMPLAINT: Seizures   HISTORY OF PRESENT ILLNESS:   This is a 66 yo female with a PMH of Stroke, OSA, Seizures, Orthopnea, COPD-home O2 qhs, Neuropathy, Lymph Node Disorder, HTN, Hepatitis, GERD, Heart Murmur, Diabetes Mellitus, Depression, Congenital Deafness (she reads lips), Non-Alcoholic Cirrhosis, and Asthma.  She presented to [facility] ER 10/24 via EMS with respiratory distress and seizures.  Per ER notes the pt had a sizure at home at 11:10 am on 10/24, and then had a second seizure en route to the ER that lasted 5-10 seconds witnessed by EMS. Upon arrival to the ER she had two additional seizures and received a total of 3 mg ativan and was loaded with her home dose of Keppra and Neurology consulted.  Per ER notes the pt has not been taking her antiepileptics for "a while" because they made her sick.  In the ER lab results concerning for possible sepsis and chest xray concerning for possible health acquired pneumonia, therefore sepsis protocol initiated.  She was subsequently admitted to ICU by hospitalist team for further workup and treatment PCCM consulted.  Upon arrival to ICU she required mechanical intubation for airway protection due to multiple seizure activity.     Patient successfully extubated on 10/25 Patient remains lethargic but arousable No acute distress noted Okay to transfer to general medical floor     REVIEW OF SYSTEMS:   Alert and awake NAD Other ROS negative   VITAL SIGNS: BP (!) 149/79   Pulse 82   Temp 98.2 F (36.8 C) (Axillary)   Resp (!) 31   Ht _0  (1.575 m)   Wt 179 lb 7.3 oz (81.4 kg)   SpO2 95%   BMI 32.82 kg/m   INTAKE / OUTPUT: I/O last 3 completed shifts: In: 600 [I.V.:600] Out: 2005 [Urine:2005]  PHYSICAL EXAMINATION: General: No acute distress  patient extubated Neuro: Lethargic but arousable PERRL HEENT: supple, no JVD Cardiovascular: sinus rhythm with PVC's, s1s2, no M/R/G Lungs: rhonchi throughout, even, non labored Abdomen: +BS x4, soft, non distended  Musculoskeletal: normal bulk and tone, no edema  Skin: intact no rashes or lesions   LABS:  BMET  Recent Labs Lab 04/30/17 0358 05/01/17 0355 05/02/17 0530  NA 144 143 146*  K 2.9* 4.2 3.4*  CL 106 111 111  CO2 _1 BUN 23* 21* 21*  CREATININE 1.72* 1.10* 0.69  GLUCOSE 328* 177* 124*    Electrolytes  Recent Labs Lab 04/29/17 1523 04/30/17 0358 05/01/17 0355 05/02/17 0530  CALCIUM  --  8.2* 8.3* 8.8*  MG 1.9 1.6* 2.2 1.9  PHOS 3.5 3.2  --  2.2*    CBC  Recent Labs Lab 04/29/17 1115 04/30/17 0358 05/01/17 0355  WBC 21.8* 28.2* 10.0  HGB 15.5 13.7 12.8  HCT 45.6 41.1 37.6  PLT 316 473* 202    Coag's  Recent Labs Lab 04/29/17 1523  APTT 26  INR 1.03    Sepsis Markers  Recent Labs Lab 04/29/17 1315 04/29/17 1523 04/29/17 1812 04/30/17 1704  LATICACIDVEN 1.5  --  6.5* 1.8  PROCALCITON  --  <0.10  --   --     ABG  Recent Labs Lab 04/29/17 1500  PHART 7.30*  PCO2ART 56*  PO2ART 290*    Liver Enzymes  Recent Labs Lab 04/29/17  1115 04/30/17 0358  AST 20 24  ALT 10* 9*  ALKPHOS 94 88  BILITOT 0.6 0.5  ALBUMIN 3.4* 2.9*    Cardiac Enzymes  Recent Labs Lab 04/29/17 1115  TROPONINI 0.04*    Glucose  Recent Labs Lab 05/01/17 0749 05/01/17 1143 05/01/17 1619 05/01/17 1949 05/01/17 2342 05/02/17 0343  GLUCAP 153* 149* 216* 139* 105* 139*    ANTIBIOTICS: Cefepime 10/24>> Vancomycin 10/24>>   Antibiotics Given (last 72 hours)    Date/Time Action Medication Dose Rate   04/29/17 1251 New Bag/Given   ceFEPIme (MAXIPIME) 1 g in dextrose 5 % 50 mL IVPB 1 g 100 mL/hr   04/29/17 1308 New Bag/Given   vancomycin (VANCOCIN) IVPB 1000 mg/200 mL premix 1,000 mg 200 mL/hr   04/30/17 0900 New Bag/Given    ceFEPIme (MAXIPIME) 2 g in dextrose 5 % 50 mL IVPB 2 g 100 mL/hr   05/01/17 0907 New Bag/Given   ceFEPIme (MAXIPIME) 2 g in dextrose 5 % 50 mL IVPB 2 g 100 mL/hr      SIGNIFICANT EVENTS: 10/24-Pt admitted to ICU required mechanical intubation upon arrival to ICU  10/25-patient extubated LINES/TUBES: ETT 10/24>>10/25  ASSESSMENT / PLAN:  PULMONARY -Acute respiratory failure secondary to possible HAP   Patient successfully extubated Hx: COPD and Asthma  CARDIOVASCULAR A:  Mildly elevated troponin likely secondary to demand ischemia  P:  Continuous telemetry monitoring Trend troponin's  Hold all outpatient antihypertensives for now   GASTROINTESTINAL Check swallow study speech eval if needed  HEMATOLOGIC A:   No acute issues  P:  DVT px: SCD's  Monitor CBC intermittently Transfuse per usual guidelines   OK to transfer to floor   Corrin Parker, M.D.  [facility]  Medical Director [place] Director [place] Department

## 2017-05-02 NOTE — Progress Notes (Signed)
PT Cancellation Note  Patient Details Name: [name] MRN: [id number] DOB: January 02, 1951   Cancelled Treatment:    Reason Eval/Treat Not Completed: Fatigue/lethargy limiting ability to participate (Evaluation re-attempted.  Patient lethargic, unable to follow commands or meaningfully participate with PT evaluation at this time.  Will continue efforts next date.)   Of note, per discussion with SLP, patient with significant difficulty following commands, interacting with therapist despite use of interpreter.  Does not appear to be fully aware/attending of interpreter, which will make functional communication extremely difficult.  Reports RN has placed call to son to discuss baseline communication and identify any additional strategies that may assist care team when communicating with patient.  Will follow for updates as available.    Dequavion Follette H. Owens Shark, PT, DPT, NCS 05/02/17, 10:43 AM [phone]

## 2017-05-02 NOTE — Evaluation (Signed)
Clinical/Bedside Swallow Evaluation Patient Details  Name: [name] MRN: [id number] Date of Birth: 09-12-1950  Today's Date: 05/02/2017 Time: SLP Start Time (ACUTE ONLY): 0900 SLP Stop Time (ACUTE ONLY): 1000 SLP Time Calculation (min) (ACUTE ONLY): 60 min  Past Medical History:  Past Medical History:  Diagnosis Date  . Asthma   . Cirrhosis, non-alcoholic ([place])   . COPD (chronic obstructive pulmonary disease) ([place])   . Deaf   . Depression   . Diabetes mellitus without complication ([place])   . GERD (gastroesophageal reflux disease)   . Heart murmur   . Hepatitis   . Hypertension   . Lymph node disorder    arm  . Neuropathy   . On home oxygen therapy    hs  . Orthopnea   . RLS (restless legs syndrome)   . Seizures ([place])   . Shortness of breath dyspnea   . Sleep apnea   . Stroke [facility])    tia   Past Surgical History:  Past Surgical History:  Procedure Laterality Date  . CATARACT EXTRACTION W/PHACO Right 11/23/2014   Procedure: CATARACT EXTRACTION PHACO AND INTRAOCULAR LENS PLACEMENT (IOC);  Surgeon: Lyla Glassing, MD;  Location: [facility] ORS;  Service: Ophthalmology;  Laterality: Right;  . CATARACT EXTRACTION W/PHACO Left 12/14/2014   Procedure: CATARACT EXTRACTION PHACO AND INTRAOCULAR LENS PLACEMENT (IOC);  Surgeon: Lyla Glassing, MD;  Location: [facility] ORS;  Service: Ophthalmology;  Laterality: Left;  US:01:16.6 AP:15.8 CDE:12.14  . CESAREAN SECTION    . CHOLECYSTECTOMY    . KNEE ARTHROPLASTY    . THUMB ARTHROSCOPY    . TONSILLECTOMY    . TYMPANOPLASTY     muliple   HPI:  This is a 66 yo female with a PMH of Stroke, OSA, Seizures, Orthopnea, COPD-home O2 qhs, Neuropathy, Lymph Node Disorder, HTN, Hepatitis, GERD, Heart Murmur, Diabetes Mellitus, Depression, Congenital Deafness (she reads lips), Non-Alcoholic Cirrhosis, and Asthma.  She presented to [facility] ER 10/24 via EMS with respiratory distress and seizures.  Per ER notes the pt had a sizure at home at 11:10 am on  10/24, and then had a second seizure en route to the ER that lasted 5-10 seconds witnessed by EMS. Upon arrival to the ER she had two additional seizures and received a total of 3 mg ativan and was loaded with her home dose of Keppra and Neurology consulted.  Per ER notes the pt has not been taking her antiepileptics for "a while" because they made her sick.  In the ER lab results concerning for possible sepsis and chest xray concerning for possible health acquired pneumonia, therefore sepsis protocol initiated.  She was subsequently admitted to ICU by hospitalist team for further workup and treatment PCCM consulted.  Upon arrival to ICU she required mechanical intubation for airway protection due to multiple seizure activity.     Assessment / Plan / Recommendation Clinical Impression  Pt presents w/oropharyngeal dysphagia and is at moderate to high risk of aspiration. Pt recently extubated on 10/25 and was unarousable yesterday. Sitter informed ST that pt has been unarousable this morning as well. When ST entered room pt was alert and sitting upright in bed, but did not appear to complete attend to ST and was unable to follow commands. Pt given trials of ice chips, thin water, nectar and honey consistencies. Pt demonstrated overt s/s of aspiration w/thin and nectar consistencies evidenced by overt cough immediate with thin and delayed with nectar. Pt also demonstrated increased WOB during sips of thin and nectar.  Pt appeared to tolerate ice chips and honey, however following evaluation pt began coughing and gagging for several minutes.  Pt demonstrated oral phase deficits with puree consistency evidenced by multiple swallows and prolonged oral transit. Pt is at high risk of aspiraiton d/t oropharyngeal deficits as well as mental state. Recommend continue w/NPO except for ice chips if requested. Will f/u in 1-2 days and re-assess if pt is appropriate. *Interpreter arrived, but did not attend to his signs and did  not attempt to communicate.  SLP Visit Diagnosis: Dysphagia, oropharyngeal phase (R13.12)    Aspiration Risk  Moderate aspiration risk;Severe aspiration risk    Diet Recommendation Ice chips PRN after oral care;NPO   Liquid Administration via: Other (Comment) (Ice chips only) Medication Administration: Via alternative means Compensations: Minimize environmental distractions;Slow rate;Small sips/bites Postural Changes: Seated upright at 90 degrees;Remain upright for at least 30 minutes after po intake    Other  Recommendations Oral Care Recommendations: Oral care QID;Staff/trained caregiver to provide oral care   Follow up Recommendations  (TBD)      Frequency and Duration min 3x week  2 weeks       Prognosis Prognosis for Safe Diet Advancement: Fair Barriers to Reach Goals: Cognitive deficits      Swallow Study   General Date of Onset: 05/01/17 HPI: This is a 66 yo female with a PMH of Stroke, OSA, Seizures, Orthopnea, COPD-home O2 qhs, Neuropathy, Lymph Node Disorder, HTN, Hepatitis, GERD, Heart Murmur, Diabetes Mellitus, Depression, Congenital Deafness (she reads lips), Non-Alcoholic Cirrhosis, and Asthma.  She presented to [facility] ER 10/24 via EMS with respiratory distress and seizures.  Per ER notes the pt had a sizure at home at 11:10 am on 10/24, and then had a second seizure en route to the ER that lasted 5-10 seconds witnessed by EMS. Upon arrival to the ER she had two additional seizures and received a total of 3 mg ativan and was loaded with her home dose of Keppra and Neurology consulted.  Per ER notes the pt has not been taking her antiepileptics for "a while" because they made her sick.  In the ER lab results concerning for possible sepsis and chest xray concerning for possible health acquired pneumonia, therefore sepsis protocol initiated.  She was subsequently admitted to ICU by hospitalist team for further workup and treatment PCCM consulted.  Upon arrival to ICU she  required mechanical intubation for airway protection due to multiple seizure activity.   Type of Study: Bedside Swallow Evaluation Previous Swallow Assessment: None  Diet Prior to this Study: NPO Temperature Spikes Noted: No Respiratory Status: Nasal cannula History of Recent Intubation: Yes Length of Intubations (days): 1 days Date extubated: 04/30/17 Behavior/Cognition: Lethargic/Drowsy;Requires cueing;Doesn't follow directions Oral Cavity Assessment: Dry Oral Care Completed by SLP: Recent completion by staff Oral Cavity - Dentition: Edentulous Vision:  (unable to assess) Self-Feeding Abilities: Needs set up;Total assist Patient Positioning: Upright in bed Baseline Vocal Quality: Not observed Volitional Cough: Strong Volitional Swallow: Able to elicit    Oral/Motor/Sensory Function Overall Oral Motor/Sensory Function: Other (comment) (Unable to assess, d/t pt unable to follow commands. Appeared grossly WFL in oral phase of swallow. )   Ice Chips Ice chips: Within functional limits Presentation: Spoon Other Comments: Pt tolerated 3 tsps of ice chips w/out overt s/s of aspiration   Thin Liquid Thin Liquid: Impaired Presentation: Cup;Straw Pharyngeal  Phase Impairments: Cough - Immediate;Other (comments) (Increased WOB) Other Comments: Pt appeared to tolerate sips of thin by cup, except observed increased  WOB. Pt demonstrated immediate cough following sip of thin by straw.    Nectar Thick Nectar Thick Liquid: Impaired Presentation: Straw Pharyngeal Phase Impairments: Cough - Delayed Other Comments: Pt demonstrated delayed cough with nectar by straw as well as increased WOB.    Honey Thick Honey Thick Liquid: Within functional limits Presentation: Cup Other Comments: Pt tolerated 3 sips of honey by cup without overt s/s of aspiration.   Puree Puree: Impaired Presentation: Spoon Oral Phase Functional Implications: Prolonged oral transit Pharyngeal Phase Impairments: Multiple  swallows Other Comments: Pt demonstrated increased oral transit time and multiple swallows w/puree consistency. No overt cough or throat clear observed.    Solid   GO   Solid: Not tested Other Comments: Pt edentulous    Functional Assessment Tool Used: clinical judgment Functional Limitations: Swallowing Swallow Current Status (E2683): At least 40 percent but less than 60 percent impaired, limited or restricted Swallow Goal Status [phone]): At least 40 percent but less than 60 percent impaired, limited or restricted   [facility], [place], [place] 05/02/2017,10:35 AM

## 2017-05-02 NOTE — Progress Notes (Signed)
Subjective: No further seizure activity  Objective: Current vital signs: BP (!) 151/75   Pulse 85   Temp 99.3 F (37.4 C)   Resp (!) 35   Ht _0  (1.575 m)   Wt 81.4 kg (179 lb 7.3 oz)   SpO2 92%   BMI 32.82 kg/m  Vital signs in last 24 hours: Temp:  [98.2 F (36.8 C)-99.3 F (37.4 C)] 99.3 F (37.4 C) (10/27 0800) Pulse Rate:  [71-96] 85 (10/27 0800) Resp:  [22-35] 35 (10/27 0800) BP: (144-173)/(68-84) 151/75 (10/27 0800) SpO2:  [89 %-99 %] 92 % (10/27 0800)  Intake/Output from previous day: 10/26 0701 - 10/27 0700 In: -  Out: 1450 [Urine:1450] Intake/Output this shift: Total I/O In: -  Out: 500 [Urine:500] Nutritional status:    Neurologic Exam: Mental Status: Lethargic.  Opens eyes to painful stimulation.  Not following nonverbal sues.   Cranial Nerves: II: Discs flat bilaterally; Pupils equal, round, reactive to light and accommodation III,IV, VI: Stares off into space and does not focus V,VII: corneals intact bilaterally VIII: deaf IX,X: gag reflex present XI: unable to test XII: unable to test Motor: No extremity movement    Sensory: Responds to noxious stimuli throughout Deep Tendon Reflexes: 2+ and symmetric with sustained clonus in the lower extremities   Lab Results: Basic Metabolic Panel:  Recent Labs Lab 04/29/17 1115 04/29/17 1523 04/30/17 0358 05/01/17 0355 05/02/17 0530  NA 140  --  144 143 146*  K 2.1*  --  2.9* 4.2 3.4*  CL 95*  --  106 111 111  CO2 30  --  _1 GLUCOSE 212*  --  328* 177* 124*  BUN 27*  --  23* 21* 21*  CREATININE 1.83*  --  1.72* 1.10* 0.69  CALCIUM 9.0  --  8.2* 8.3* 8.8*  MG  --  1.9 1.6* 2.2 1.9  PHOS  --  3.5 3.2  --  2.2*    Liver Function Tests:  Recent Labs Lab 04/29/17 1115 04/30/17 0358  AST 20 24  ALT 10* 9*  ALKPHOS 94 88  BILITOT 0.6 0.5  PROT 7.0 6.0*  ALBUMIN 3.4* 2.9*   No results for input(s): LIPASE, AMYLASE in the last 168 hours.  Recent Labs Lab 04/29/17 1115   AMMONIA 24    CBC:  Recent Labs Lab 04/29/17 1115 04/30/17 0358 05/01/17 0355  WBC 21.8* 28.2* 10.0  NEUTROABS 18.5*  --   --   HGB 15.5 13.7 12.8  HCT 45.6 41.1 37.6  MCV 81.7 84.0 84.2  PLT 316 473* 202    Cardiac Enzymes:  Recent Labs Lab 04/29/17 1115  TROPONINI 0.04*    Lipid Panel:  Recent Labs Lab 04/29/17 1523  TRIG 133    CBG:  Recent Labs Lab 05/01/17 1619 05/01/17 1949 05/01/17 2342 05/02/17 0343 05/02/17 0811  GLUCAP 216* 139* 105* 139* 161*    Microbiology: Results for orders placed or performed during the hospital encounter of 04/29/17  Blood culture (routine x 2)     Status: None (Preliminary result)   Collection Time: 04/29/17 12:38 PM  Result Value Ref Range Status   Specimen Description BLOOD RFOA  Final   Special Requests   Final    BOTTLES DRAWN AEROBIC AND ANAEROBIC Blood Culture adequate volume   Culture NO GROWTH 3 DAYS  Final   Report Status PENDING  Incomplete  Blood culture (routine x 2)     Status: None (Preliminary result)   Collection Time:  04/29/17 12:39 PM  Result Value Ref Range Status   Specimen Description BLOOD RIGHT HAND  Final   Special Requests   Final    BOTTLES DRAWN AEROBIC AND ANAEROBIC Blood Culture adequate volume   Culture NO GROWTH 3 DAYS  Final   Report Status PENDING  Incomplete  Culture, respiratory (NON-Expectorated)     Status: None (Preliminary result)   Collection Time: 04/29/17  3:00 PM  Result Value Ref Range Status   Specimen Description TRACHEAL ASPIRATE  Final   Special Requests NONE  Final   Gram Stain   Final    MODERATE WBC PRESENT, PREDOMINANTLY PMN FEW GRAM POSITIVE COCCI FEW GRAM POSITIVE RODS    Culture   Final    CULTURE REINCUBATED FOR BETTER GROWTH Performed at [facility], [place] [address] [place], [place] [address]    Report Status PENDING  Incomplete  MRSA PCR Screening     Status: None   Collection Time: 04/29/17  4:00 PM  Result Value Ref Range Status    MRSA by PCR NEGATIVE NEGATIVE Final    Comment:        The GeneXpert MRSA Assay (FDA approved for NASAL specimens only), is one component of a comprehensive MRSA colonization surveillance program. It is not intended to diagnose MRSA infection nor to guide or monitor treatment for MRSA infections.     Coagulation Studies:  Recent Labs  04/29/17 1523  LABPROT 13.4  INR 1.03    Imaging: Dg Chest Port 1 View  Result Date: 05/01/2017 CLINICAL DATA:  Respiratory failure. EXAM: PORTABLE CHEST 1 VIEW COMPARISON:  04/30/2017. FINDINGS: Interim removal of endotracheal tube and NG tube. Left IJ line in stable position. Basilar atelectasis. Persistent left lower lobe infiltrate and left-sided pleural effusion. Slight improvement. IMPRESSION: 1. Interim removal of endotracheal tube and NG tube. Left IJ line stable position. 2. Slight improvement of left base infiltrate and left-sided pleural effusion. Bibasilar atelectasis. Electronically Signed   By: Marcello Moores  Register   On: 05/01/2017 06:33    Medications:  I have reviewed the patient's current medications. Scheduled: . clopidogrel  75 mg Oral Daily  . enoxaparin (LOVENOX) injection  40 mg Subcutaneous Q24H  . gabapentin  300 mg Oral BID  . insulin aspart  0-15 Units Subcutaneous Q4H  . ipratropium-albuterol  3 mL Nebulization Q6H  . mouth rinse  15 mL Mouth Rinse BID    Assessment/Plan:  Poorly cooperative Patient now extubated.  On 252m q 12 hours of Vimpat.  No further seizures reported.   EEG done consistent with triphasic waves and no seizure activity  Agree with transfer to floor Call with questions.

## 2017-05-02 NOTE — Progress Notes (Signed)
Sign language interpreter in to assist with communication but pt not participating.  Opens eyes with soft sternal rub but not tracking or interacting.  Sitter remains at bedside for safety because pt is impulsive with getting up to [facility] to void.  Left message with pt's son to call to discuss communication advise.  Report called to Janett Billow, RN on 1A.  Bubba Camp, RN

## 2017-05-02 NOTE — Progress Notes (Signed)
Pt alert but non-verbal. IV infusing with NS. Pt has sitter at bedside. Notified by sitter that pt was trying to get out of bed and being noncompliant. Assessed pt and oxygen sats on 2L was in the low 80s. Respiratory gave breathing treatment and sats came up to 90. Pt now on 4L and sats 89-90. MD [place] notified. Order put in for chest xray tomorrow. No other complaints at this time. Will continue to monitor.

## 2017-05-03 ENCOUNTER — Inpatient Hospital Stay: Payer: Medicare HMO

## 2017-05-03 DIAGNOSIS — R410 Disorientation, unspecified: Secondary | ICD-10-CM

## 2017-05-03 LAB — GLUCOSE, CAPILLARY
GLUCOSE-CAPILLARY: 203 mg/dL — AB (ref 65–99)
GLUCOSE-CAPILLARY: 224 mg/dL — AB (ref 65–99)
GLUCOSE-CAPILLARY: 155 mg/dL — AB (ref 65–99)
Glucose-Capillary: 181 mg/dL — ABNORMAL HIGH (ref 65–99)
Glucose-Capillary: 199 mg/dL — ABNORMAL HIGH (ref 65–99)
Glucose-Capillary: 238 mg/dL — ABNORMAL HIGH (ref 65–99)

## 2017-05-03 MED ORDER — INSULIN ASPART 100 UNIT/ML ~~LOC~~ SOLN
0.0000 [IU] | Freq: Three times a day (TID) | SUBCUTANEOUS | Status: DC
  Administered 2017-05-03: 3 [IU] via SUBCUTANEOUS
  Administered 2017-05-03 – 2017-05-04 (×2): 2 [IU] via SUBCUTANEOUS
  Administered 2017-05-04 – 2017-05-05 (×3): 3 [IU] via SUBCUTANEOUS
  Administered 2017-05-05: 2 [IU] via SUBCUTANEOUS
  Administered 2017-05-05 – 2017-05-06 (×2): 1 [IU] via SUBCUTANEOUS
  Administered 2017-05-06 – 2017-05-07 (×2): 2 [IU] via SUBCUTANEOUS
  Administered 2017-05-07: 1 [IU] via SUBCUTANEOUS
  Filled 2017-05-03 (×12): qty 1

## 2017-05-03 MED ORDER — FUROSEMIDE 10 MG/ML IJ SOLN
60.0000 mg | Freq: Once | INTRAMUSCULAR | Status: AC
  Administered 2017-05-03: 60 mg via INTRAVENOUS
  Filled 2017-05-03: qty 8

## 2017-05-03 MED ORDER — GADOBENATE DIMEGLUMINE 529 MG/ML IV SOLN
16.0000 mL | Freq: Once | INTRAVENOUS | Status: AC | PRN
  Administered 2017-05-03: 16 mL via INTRAVENOUS

## 2017-05-03 MED ORDER — INSULIN ASPART 100 UNIT/ML ~~LOC~~ SOLN: 0.0000 [IU] | Freq: Every day | SUBCUTANEOUS | Status: DC

## 2017-05-03 MED ORDER — IPRATROPIUM-ALBUTEROL 0.5-2.5 (3) MG/3ML IN SOLN
3.0000 mL | RESPIRATORY_TRACT | Status: DC
  Administered 2017-05-03 – 2017-05-04 (×6): 3 mL via RESPIRATORY_TRACT
  Filled 2017-05-03 (×6): qty 3

## 2017-05-03 MED ORDER — LORAZEPAM 2 MG/ML IJ SOLN
0.5000 mg | Freq: Once | INTRAMUSCULAR | Status: AC
  Administered 2017-05-03: 0.5 mg via INTRAVENOUS
  Filled 2017-05-03: qty 1

## 2017-05-03 MED ORDER — DEXTROSE 5 % IV SOLN
2.0000 g | Freq: Two times a day (BID) | INTRAVENOUS | Status: DC
  Administered 2017-05-03 – 2017-05-04 (×3): 2 g via INTRAVENOUS
  Filled 2017-05-03 (×4): qty 2

## 2017-05-03 MED ORDER — BUDESONIDE 0.5 MG/2ML IN SUSP
0.5000 mg | Freq: Two times a day (BID) | RESPIRATORY_TRACT | Status: DC
  Administered 2017-05-03 – 2017-05-04 (×3): 0.5 mg via RESPIRATORY_TRACT
  Filled 2017-05-03 (×3): qty 2

## 2017-05-03 NOTE — Progress Notes (Signed)
Patient ID: [name], female   DOB: 1950-10-08, 66 y.o.   MRN: [id number]  [facility] PROGRESS NOTE  [name] MHD:[id number] DOB: [birth year] DOA: 04/29/2017 PCP: Cletis Athens, MD  HPI/Subjective: Patient's respiratory status had declined overnight in the morning and needed to be placed on high flow nasal cannula.  Patient is awake and does track me across the room.  I was unable to get any response from her even with the sign language interpreter.  Family states that she normally does sign language very well and family was unable to get her to do sign language interpretation with them either.  Objective: Vitals:   05/03/17 1101 05/03/17 1149  BP:    Pulse:    Resp:    Temp:    SpO2: 90% 92%    Filed Weights   04/29/17 1446 04/30/17 0500 05/01/17 0354  Weight: 74.3 kg (163 lb 12.8 oz) 74.9 kg (165 lb 2 oz) 81.4 kg (179 lb 7.3 oz)    ROS: Review of Systems  Unable to perform ROS: Acuity of condition   Exam: Physical Exam  HENT:  Nose: No mucosal edema.  Throat dry  Eyes: Pupils are equal, round, and reactive to light. Conjunctivae and lids are normal.  Neck: Carotid bruit is not present. No thyromegaly present.  Cardiovascular: Regular rhythm, S1 normal, S2 normal and normal heart sounds.   Respiratory: She has decreased breath sounds in the right lower field and the left lower field. She has no wheezes. She has rhonchi in the right middle field and the left middle field. She has rales in the right lower field and the left lower field.  GI: Soft. Bowel sounds are normal. There is no tenderness.  Musculoskeletal:       Right ankle: She exhibits no swelling.       Left ankle: She exhibits no swelling.  Neurological: She is alert.  Moves her extremities on her own.  Was able to sit up for me  Skin: Skin is warm. No rash noted.  Psychiatric:  Alert and tracks me across the room.  Does not talk.      Data Reviewed: Basic Metabolic Panel:  Recent  Labs Lab 04/29/17 1115 04/29/17 1523 04/30/17 0358 05/01/17 0355 05/02/17 0530  NA 140  --  144 143 146*  K 2.1*  --  2.9* 4.2 3.4*  CL 95*  --  106 111 111  CO2 30  --  _0 GLUCOSE 212*  --  328* 177* 124*  BUN 27*  --  23* 21* 21*  CREATININE 1.83*  --  1.72* 1.10* 0.69  CALCIUM 9.0  --  8.2* 8.3* 8.8*  MG  --  1.9 1.6* 2.2 1.9  PHOS  --  3.5 3.2  --  2.2*   Liver Function Tests:  Recent Labs Lab 04/29/17 1115 04/30/17 0358  AST 20 24  ALT 10* 9*  ALKPHOS 94 88  BILITOT 0.6 0.5  PROT 7.0 6.0*  ALBUMIN 3.4* 2.9*    Recent Labs Lab 04/29/17 1115  AMMONIA 24   CBC:  Recent Labs Lab 04/29/17 1115 04/30/17 0358 05/01/17 0355  WBC 21.8* 28.2* 10.0  NEUTROABS 18.5*  --   --   HGB 15.5 13.7 12.8  HCT 45.6 41.1 37.6  MCV 81.7 84.0 84.2  PLT 316 473* 202   Cardiac Enzymes:  Recent Labs Lab 04/29/17 1115  TROPONINI 0.04*   BNP (last 3 results)  Recent Labs  02/06/17  1109 03/24/17 1738  BNP 50.0 184.0*     CBG:  Recent Labs Lab 05/02/17 1947 05/02/17 2347 05/03/17 0349 05/03/17 0810 05/03/17 1136  GLUCAP 139* 181* 203* 224* 199*    Recent Results (from the past 240 hour(s))  Blood culture (routine x 2)     Status: None (Preliminary result)   Collection Time: 04/29/17 12:38 PM  Result Value Ref Range Status   Specimen Description BLOOD RFOA  Final   Special Requests   Final    BOTTLES DRAWN AEROBIC AND ANAEROBIC Blood Culture adequate volume   Culture NO GROWTH 4 DAYS  Final   Report Status PENDING  Incomplete  Blood culture (routine x 2)     Status: None (Preliminary result)   Collection Time: 04/29/17 12:39 PM  Result Value Ref Range Status   Specimen Description BLOOD RIGHT HAND  Final   Special Requests   Final    BOTTLES DRAWN AEROBIC AND ANAEROBIC Blood Culture adequate volume   Culture NO GROWTH 4 DAYS  Final   Report Status PENDING  Incomplete  Culture, respiratory (NON-Expectorated)     Status: None   Collection  Time: 04/29/17  3:00 PM  Result Value Ref Range Status   Specimen Description TRACHEAL ASPIRATE  Final   Special Requests NONE  Final   Gram Stain   Final    MODERATE WBC PRESENT, PREDOMINANTLY PMN FEW GRAM POSITIVE COCCI FEW GRAM POSITIVE RODS    Culture   Final    Consistent with normal respiratory flora. Performed at [facility], [place] [address], [place], [place] [address]    Report Status 05/02/2017 FINAL  Final  MRSA PCR Screening     Status: None   Collection Time: 04/29/17  4:00 PM  Result Value Ref Range Status   MRSA by PCR NEGATIVE NEGATIVE Final    Comment:        The GeneXpert MRSA Assay (FDA approved for NASAL specimens only), is one component of a comprehensive MRSA colonization surveillance program. It is not intended to diagnose MRSA infection nor to guide or monitor treatment for MRSA infections.      Studies: Dg Chest Port 1 View  Result Date: 05/03/2017 CLINICAL DATA:  Patient poor historian at this time. Per patient's RN, patient O2 SAT's in low 80's on 6L of O2. Hx CVA, HTN, hepatitis, heart murmur, DM, COPD, cirrhosis, asthma. Current smoker. EXAM: PORTABLE CHEST 1 VIEW COMPARISON:  05/01/2017 FINDINGS: Heart size is accentuated by the portable AP technique. There is opacity at the left lung base, increased compared to prior study. Mild pulmonary vascular congestion has increased compared to previous exam. Left IJ central line has been removed. IMPRESSION: Increased pulmonary vascular congestion. Increased left lower lobe infiltrate/consolidation. Electronically Signed   By: Nolon Nations M.D.   On: 05/03/2017 09:15    Scheduled Meds: . budesonide (PULMICORT) nebulizer solution  0.5 mg Nebulization BID  . clopidogrel  75 mg Oral Daily  . enoxaparin (LOVENOX) injection  40 mg Subcutaneous Q24H  . insulin aspart  0-5 Units Subcutaneous QHS  . insulin aspart  0-9 Units Subcutaneous TID WC  . ipratropium-albuterol  3 mL Nebulization Q4H  .  LORazepam  0.5 mg Intravenous Once  . mouth rinse  15 mL Mouth Rinse BID   Continuous Infusions: . ceFEPime (MAXIPIME) IV Stopped (05/03/17 1344)  . lacosamide (VIMPAT) IV Stopped (05/03/17 1143)    Assessment/Plan:  1. Acute on chronic respiratory failure with hypoxia on oxygen.  Status  post extubation 04/30/2017.  Worsening respiratory status overnight and early morning. Chest x-ray showing pulmonary congestion and pneumonia.  Case discussed with Dr. [name] critical care specialist.  Dose of IV Lasix was given.  Patient was able to taper to 6 L nasal cannula later in the day.  Continue Maxipime for pneumonia. 2. Acute encephalopathy likely from prolonged seizure.  EEG showing triphasic waves which is likely metabolic in nature.  Since the patient's mental status has not improved Case discussed with Dr. Velia Meyer neurology and we ordered an MRI of the brain with and without contrast.  Patient still did not do well with swallowing applesauce today and is n.p.o.  Swallow evaluation tomorrow. 3. Seizure disorder.  Patient stopped her seizure medications at home.  On IV Vimpat while here. 4. Type 2 diabetes mellitus on sliding scale 5. History of cirrhosis.  Check ammonia level in the morning.  Ammonia was 24 the other day. 6. Lactic acidosis improved with IV fluids. 7. Acute kidney injury.  Improved with IV fluids. 8. Hypokalemia replaced  Code Status:     Code Status Orders        Start     Ordered   04/29/17 1915  Full code  Continuous     04/29/17 1915    Code Status History    Date Active Date Inactive Code Status Order ID Comments User Context   03/25/2017  2:35 AM 03/30/2017  7:07 PM Full Code [id number]  Hugelmeyer, Ubaldo Glassing, DO Inpatient   02/11/2017  9:31 PM 02/12/2017  4:36 PM Full Code [id number]  Demetrios Loll, MD Inpatient   07/11/2016 11:31 AM 07/17/2016  3:08 PM Partial Code [id number]  Marijo Conception, RN Inpatient   07/11/2016  2:37 AM 07/11/2016 11:31 AM Full Code [id number]   Harvie Bridge, DO Inpatient   03/22/2016 10:44 PM 03/24/2016  7:12 PM Partial Code [id number]  Toy Baker, MD Inpatient   03/29/2015 11:30 AM 03/31/2015  2:21 PM Partial Code [id number]  Loletha Grayer, MD ED     Family Communication: Spoke with son and daughter-in-law at the bedside Disposition Plan: To be determined  Consultants:  Neurology  Antibiotics:  Maxipime  Time spent: 30 minutes.  Sign language interpreter at the bedside but unable to get any information from the patient.  Loletha Grayer  [employer]

## 2017-05-03 NOTE — Progress Notes (Signed)
On assessment pt O2 was 84% on 6L. Respiratory called and breathing treatment given. MD [place] notified. Pt now on high flow now and sats 95%.

## 2017-05-03 NOTE — Progress Notes (Signed)
Pt back from MRI.

## 2017-05-03 NOTE — Progress Notes (Signed)
CXR reviewed B/L lower lobe infiltrates pulm edema/atalectasis  1.keep o2 sats >88% 2.lasix 60 mg iv x 1 3.dounebs every 4 hrs 4.pulmicort neb BID  Wean fio2 as tolerated Case discussed with Dr Antionette Poles   Corrin Parker, M.D.  [facility]  Medical Director [place] Director [place] Department

## 2017-05-03 NOTE — Progress Notes (Signed)
MD [place]ona paged and notified that pts MRI are back.

## 2017-05-03 NOTE — Evaluation (Signed)
Physical Therapy Evaluation Patient Details Name: [name] MRN: [id number] DOB: December 23, 1950 Today's Date: 05/03/2017   History of Present Illness  presented to ER secondary to SOB with recurrent seizures (5-10) in route to hospital via EMS; admitted with acute/chronic respiratory failure and sepsis related to HCAP.  Intubated 10/24-25 for airway protection; now weaned to 6L supplemental O2 via .  Clinical Impression  Upon evaluation, patient alert, but unable to follow commands, actively participate with focused, skilled interventions.  Bilat UE/LE generally weak (at least 3-/5); unable to participate with formal ROM/MMT.  Currently requiring min/mod assist for sit/stand, basic transfers and gait (15') with L HHA.  Poor standing balance reactions; poor ability to interpret/respond to visual environment without constant guidance/facilitation from therapist. Per previous records, patient prefers to communicate via lip reading (unable to attend to/participate with ASL interpreter on previous attempts this hospitalization). Mod SOB with minimal exertion; sats 91-95% on 6L throughout session. Would benefit from skilled PT to address above deficits and promote optimal return to PLOF; recommend transition to STR upon discharge from acute hospitalization.     Follow Up Recommendations SNF    Equipment Recommendations       Recommendations for Other Services       Precautions / Restrictions Precautions Precautions: Fall Restrictions Weight Bearing Restrictions: No      Mobility  Bed Mobility               General bed mobility comments: seated edge of bed upon entry to room; per sitter present in room, requiring min/mod assist to complete  Transfers Overall transfer level: Needs assistance Equipment used: Rolling walker (2 wheeled) Transfers: Sit to/from Stand Sit to Stand: Min assist;Mod assist         General transfer comment: hand-over-hand assist to initiate  movement, direct transitions  Ambulation/Gait Ambulation/Gait assistance: Min assist;Mod assist Ambulation Distance (Feet): 12 Feet Assistive device: 1 person hand held assist       General Gait Details: inconsistent step height/length with poor balance reactions; requires guidance/facilitation for advancing gait and negotiating environment (question ability to fully visualize/process environment)  Stairs            Wheelchair Mobility    Modified Rankin (Stroke Patients Only)       Balance Overall balance assessment: Needs assistance Sitting-balance support: No upper extremity supported;Feet supported Sitting balance-Leahy Scale: Fair     Standing balance support: Single extremity supported Standing balance-Leahy Scale: Poor                               Pertinent Vitals/Pain Pain Assessment: Faces Faces Pain Scale: No hurt    Home Living Family/patient expects to be discharged to:: Private residence Living Arrangements: Alone   Type of Home: House Home Access: Level entry     Home Layout: One level Home Equipment: Walker - 2 wheels;Kasandra Knudsen - single point Additional Comments: Social history obtained from previous evaluation (Sept, 2018).  Patient unable to verify this date; will confirm with family as available.    Prior Function Level of Independence: Independent         Comments: Per previous evaluation (Sept 2018): reports she was fully independent, did not need AD for mobility prior to admission     Hand Dominance        Extremity/Trunk Assessment   Upper Extremity Assessment Upper Extremity Assessment: Generalized weakness (grossly at least 3-/5 throughout as noted with automatic, functional activities)  Lower Extremity Assessment Lower Extremity Assessment: Generalized weakness (grossly at least 3+ to 4-/5 throughout)       Communication   Communication: Deaf (utilizes lip reading as primary means of communication at  baseline (per previous evaluation))  Cognition Arousal/Alertness: Awake/alert Behavior During Therapy: Flat affect Overall Cognitive Status: Difficult to assess                                 General Comments: significant difficulty following commands throughout session; requiring hand-over-hand assist to initiate/guide all functional activities      General Comments      Exercises     Assessment/Plan    PT Assessment Patient needs continued PT services  PT Problem List Decreased strength;Decreased range of motion;Decreased activity tolerance;Decreased balance;Decreased mobility;Decreased coordination;Decreased cognition;Decreased knowledge of use of DME;Decreased safety awareness;Decreased knowledge of precautions       PT Treatment Interventions DME instruction;Gait training;Stair training;Functional mobility training;Therapeutic activities;Therapeutic exercise;Balance training;Patient/family education    PT Goals (Current goals can be found in the Care Plan section)  Acute Rehab PT Goals PT Goal Formulation: Patient unable to participate in goal setting Time For Goal Achievement: 05/17/17 Potential to Achieve Goals: Fair    Frequency Min 2X/week   Barriers to discharge Decreased caregiver support      Co-evaluation               AM-PAC PT "6 Clicks" Daily Activity  Outcome Measure Difficulty turning over in bed (including adjusting bedclothes, sheets and blankets)?: Unable Difficulty moving from lying on back to sitting on the side of the bed? : Unable Difficulty sitting down on and standing up from a chair with arms (e.g., wheelchair, bedside commode, etc,.)?: Unable Help needed moving to and from a bed to chair (including a wheelchair)?: A Lot Help needed walking in hospital room?: A Lot Help needed climbing 3-5 steps with a railing? : Total 6 Click Score: 8    End of Session Equipment Utilized During Treatment: Gait belt Activity Tolerance:   (limited by ability to comprehend, actively participate with formal therex/theract) Patient left: in chair;with call bell/phone within reach;with nursing/sitter in room Nurse Communication: Mobility status PT Visit Diagnosis: Difficulty in walking, not elsewhere classified (R26.2);Muscle weakness (generalized) (M62.81);Unsteadiness on feet (R26.81)    Time: 3005-1102 PT Time Calculation (min) (ACUTE ONLY): 10 min   Charges:   PT Evaluation $PT Eval Moderate Complexity: 1 Mod     PT G Codes:   PT G-Codes **NOT FOR INPATIENT CLASS** Functional Assessment Tool Used: AM-PAC 6 Clicks Basic Mobility;Clinical judgement Functional Limitation: Mobility: Walking and moving around Mobility: Walking and Moving Around Current Status (T1173): At least 60 percent but less than 80 percent impaired, limited or restricted Mobility: Walking and Moving Around Goal Status [phone]): At least 1 percent but less than 20 percent impaired, limited or restricted    Maxum Cassarino H. Owens Shark, PT, DPT, NCS 05/03/17, 3:55 PM [phone]

## 2017-05-03 NOTE — Progress Notes (Signed)
Subjective: Pt transferred out of ICU yesterday as was improving and started on open eyes but worsening respiratory status this AM and is on .  Mentation has worsened.    Objective: Current vital signs: BP (!) 152/79 (BP Location: Right Arm)   Pulse (!) 105   Temp 98.8 F (37.1 C) (Oral)   Resp (!) 30   Ht _0  (1.575 m)   Wt 81.4 kg (179 lb 7.3 oz)   SpO2 94%   BMI 32.82 kg/m  Vital signs in last 24 hours: Temp:  [98.8 F (37.1 C)] 98.8 F (37.1 C) (10/28 0706) Pulse Rate:  [105] 105 (10/28 0736) Resp:  [18-30] 30 (10/28 0736) BP: (152)/(79) 152/79 (10/28 0706) SpO2:  [84 %-95 %] 94 % (10/28 1516)  Intake/Output from previous day: 10/27 0701 - 10/28 0700 In: 738 [I.V.:258; IV Piggyback:480] Out: 500 [Urine:500] Intake/Output this shift: No intake/output data recorded. Nutritional status:    Neurologic Exam: Pt appears sedated, does not follow commands.   Does withdraw from painful stimuli but does not use sign language as she usually does  Opens eyes but does not follow.   Lab Results: Results for orders placed or performed during the hospital encounter of 04/29/17 (from the past 48 hour(s))  Glucose, capillary     Status: Abnormal   Collection Time: 05/01/17  4:19 PM  Result Value Ref Range   Glucose-Capillary 216 (H) 65 - 99 mg/dL  Glucose, capillary     Status: Abnormal   Collection Time: 05/01/17  7:49 PM  Result Value Ref Range   Glucose-Capillary 139 (H) 65 - 99 mg/dL  Glucose, capillary     Status: Abnormal   Collection Time: 05/01/17 11:42 PM  Result Value Ref Range   Glucose-Capillary 105 (H) 65 - 99 mg/dL  Glucose, capillary     Status: Abnormal   Collection Time: 05/02/17  3:43 AM  Result Value Ref Range   Glucose-Capillary 139 (H) 65 - 99 mg/dL  Basic metabolic panel     Status: Abnormal   Collection Time: 05/02/17  5:30 AM  Result Value Ref Range   Sodium 146 (H) 135 - 145 mmol/L   Potassium 3.4 (L) 3.5 - 5.1 mmol/L   Chloride 111 101 - 111  mmol/L   CO2 28 22 - 32 mmol/L   Glucose, Bld 124 (H) 65 - 99 mg/dL   BUN 21 (H) 6 - 20 mg/dL   Creatinine, Ser 0.69 0.44 - 1.00 mg/dL   Calcium 8.8 (L) 8.9 - 10.3 mg/dL   GFR calc non Af Amer >60 >60 mL/min   GFR calc Af Amer >60 >60 mL/min    Comment: (NOTE) The eGFR has been calculated using the CKD EPI equation. This calculation has not been validated in all clinical situations. eGFR's persistently <60 mL/min signify possible Chronic Kidney Disease.    Anion gap 7 5 - 15  Magnesium     Status: None   Collection Time: 05/02/17  5:30 AM  Result Value Ref Range   Magnesium 1.9 1.7 - 2.4 mg/dL  Phosphorus     Status: Abnormal   Collection Time: 05/02/17  5:30 AM  Result Value Ref Range   Phosphorus 2.2 (L) 2.5 - 4.6 mg/dL  Glucose, capillary     Status: Abnormal   Collection Time: 05/02/17  8:11 AM  Result Value Ref Range   Glucose-Capillary 161 (H) 65 - 99 mg/dL  Glucose, capillary     Status: Abnormal   Collection Time: 05/02/17  1:22 PM  Result Value Ref Range   Glucose-Capillary 153 (H) 65 - 99 mg/dL  Glucose, capillary     Status: Abnormal   Collection Time: 05/02/17  5:06 PM  Result Value Ref Range   Glucose-Capillary 139 (H) 65 - 99 mg/dL   Comment 1 Notify RN   Glucose, capillary     Status: Abnormal   Collection Time: 05/02/17  7:47 PM  Result Value Ref Range   Glucose-Capillary 139 (H) 65 - 99 mg/dL   Comment 1 Notify RN   Glucose, capillary     Status: Abnormal   Collection Time: 05/02/17 11:47 PM  Result Value Ref Range   Glucose-Capillary 181 (H) 65 - 99 mg/dL   Comment 1 Notify RN   Glucose, capillary     Status: Abnormal   Collection Time: 05/03/17  3:49 AM  Result Value Ref Range   Glucose-Capillary 203 (H) 65 - 99 mg/dL  Glucose, capillary     Status: Abnormal   Collection Time: 05/03/17  8:10 AM  Result Value Ref Range   Glucose-Capillary 224 (H) 65 - 99 mg/dL   Comment 1 Notify RN   Glucose, capillary     Status: Abnormal   Collection Time:  05/03/17 11:36 AM  Result Value Ref Range   Glucose-Capillary 199 (H) 65 - 99 mg/dL   Comment 1 Notify RN     Recent Results (from the past 240 hour(s))  Blood culture (routine x 2)     Status: None (Preliminary result)   Collection Time: 04/29/17 12:38 PM  Result Value Ref Range Status   Specimen Description BLOOD RFOA  Final   Special Requests   Final    BOTTLES DRAWN AEROBIC AND ANAEROBIC Blood Culture adequate volume   Culture NO GROWTH 4 DAYS  Final   Report Status PENDING  Incomplete  Blood culture (routine x 2)     Status: None (Preliminary result)   Collection Time: 04/29/17 12:39 PM  Result Value Ref Range Status   Specimen Description BLOOD RIGHT HAND  Final   Special Requests   Final    BOTTLES DRAWN AEROBIC AND ANAEROBIC Blood Culture adequate volume   Culture NO GROWTH 4 DAYS  Final   Report Status PENDING  Incomplete  Culture, respiratory (NON-Expectorated)     Status: None   Collection Time: 04/29/17  3:00 PM  Result Value Ref Range Status   Specimen Description TRACHEAL ASPIRATE  Final   Special Requests NONE  Final   Gram Stain   Final    MODERATE WBC PRESENT, PREDOMINANTLY PMN FEW GRAM POSITIVE COCCI FEW GRAM POSITIVE RODS    Culture   Final    Consistent with normal respiratory flora. Performed at [facility], [place] [address] [place], [place] [address]    Report Status 05/02/2017 FINAL  Final  MRSA PCR Screening     Status: None   Collection Time: 04/29/17  4:00 PM  Result Value Ref Range Status   MRSA by PCR NEGATIVE NEGATIVE Final    Comment:        The GeneXpert MRSA Assay (FDA approved for NASAL specimens only), is one component of a comprehensive MRSA colonization surveillance program. It is not intended to diagnose MRSA infection nor to guide or monitor treatment for MRSA infections.     Lipid Panel No results for input(s): CHOL, TRIG, HDL, CHOLHDL, VLDL, LDLCALC in the last 72 hours.  Studies/Results: Dg Chest Port 1  View  Result Date: 05/03/2017 CLINICAL  DATA:  Patient poor historian at this time. Per patient's RN, patient O2 SAT's in low 80's on 6L of O2. Hx CVA, HTN, hepatitis, heart murmur, DM, COPD, cirrhosis, asthma. Current smoker. EXAM: PORTABLE CHEST 1 VIEW COMPARISON:  05/01/2017 FINDINGS: Heart size is accentuated by the portable AP technique. There is opacity at the left lung base, increased compared to prior study. Mild pulmonary vascular congestion has increased compared to previous exam. Left IJ central line has been removed. IMPRESSION: Increased pulmonary vascular congestion. Increased left lower lobe infiltrate/consolidation. Electronically Signed   By: Nolon Nations M.D.   On: 05/03/2017 09:15    Medications: I have reviewed the patient's current medications.  Assessment/Plan:  On vimpat 200 BID EEG done Friday and there was no seizure activity  I think repeat MRI brain w/ and w/out contrast is appropriate but I think hypoxemia is possibly contributing to mental status Will follow 05/03/2017  3:26 PM

## 2017-05-03 NOTE — Progress Notes (Signed)
PT Cancellation Note  Patient Details Name: [name] MRN: [id number] DOB: August 07, 1950   Cancelled Treatment:    Reason Eval/Treat Not Completed:  Evaluation attempted.  Patient resting in bed with eyes closed, but opens eyes easily with light touch from therapist.  Promptly turns head from therapist and closes eyes back; does not attempt to follow commands or interact with therapist.  Attempted active assist movement of extremities, gestures and return demonstration for participation with functional activities. Patient with absent attempts to actively engage with therapist, ultimately pulling LEs back to resting position when therapist attempts to move towards edge of bed.  Per RN, additional attempts at communication with ASL interpreter unsuccessful, as patient does not appear to attend to/interact/understand.  Communication remains significant barrier at this point.  MRI pending; will continue to follow and re-attempt session as appropriate.   Carigan Lister H. Owens Shark, PT, DPT, NCS 05/03/17, 11:15 AM [phone]

## 2017-05-03 NOTE — Progress Notes (Signed)
Pharmacy Antibiotic Note  [name] is a 66 y.o. female admitted on 04/29/2017 with sepsis.  Pharmacy has been consulted for cefepime dosing for PNA. Patient has tolerated cephalosporins on previous admissions.   Plan: Given improvement in renal function, will increase cefepime to 2 g IV q12h.  Height: _0  (157.5 cm) Weight: 179 lb 7.3 oz (81.4 kg) IBW/kg (Calculated) : 50.1  Temp (24hrs), Avg:98.9 F (37.2 C), Min:98.6 F (37 C), Max:99.4 F (37.4 C)   Recent Labs Lab 04/29/17 1115 04/29/17 1315 04/29/17 1812 04/30/17 0358 04/30/17 1704 05/01/17 0355 05/02/17 0530  WBC 21.8*  --   --  28.2*  --  10.0  --   CREATININE 1.83*  --   --  1.72*  --  1.10* 0.69  LATICACIDVEN 2.1* 1.5 6.5*  --  1.8  --   --     Estimated Creatinine Clearance: 69.3 mL/min (by C-G formula based on SCr of 0.69 mg/dL).    Allergies  Allergen Reactions  . Aspirin Itching  . Celebrex [Celecoxib] Itching    itching  . Ciprofloxacin Itching  . Codeine Itching  . Fosphenytoin Itching  . Levaquin [Levofloxacin In D5w] Itching  . Levofloxacin Itching  . Lovastatin Itching  . Penicillins     Documentation indicates severe reaction  Pt tolerated cephalosporin without adverse reaction 09/18   . Pravastatin Itching  . Sulfa Antibiotics Itching    Antimicrobials this admission: Vancomycin x 1 in the ED, 10/24.  Cefepime 10/24 >>   Dose adjustments this admission: N/A  Microbiology results: 10/24 BCx: No growth 4 days 10/24 Sputum: Normal flora 10/24 MRSA PCR: negative   Thank you for allowing pharmacy to be a part of this patient's care.  Lenis Noon, PharmD Clinical Pharmacist 05/03/2017 10:40 AM

## 2017-05-04 LAB — GLUCOSE, CAPILLARY
GLUCOSE-CAPILLARY: 203 mg/dL — AB (ref 65–99)
GLUCOSE-CAPILLARY: 200 mg/dL — AB (ref 65–99)
Glucose-Capillary: 211 mg/dL — ABNORMAL HIGH (ref 65–99)
Glucose-Capillary: 197 mg/dL — ABNORMAL HIGH (ref 65–99)

## 2017-05-04 LAB — BASIC METABOLIC PANEL
Anion gap: 15 (ref 5–15)
BUN: 21 mg/dL — AB (ref 6–20)
CHLORIDE: 112 mmol/L — AB (ref 101–111)
CO2: 24 mmol/L (ref 22–32)
Calcium: 8.9 mg/dL (ref 8.9–10.3)
Creatinine, Ser: 0.75 mg/dL (ref 0.44–1.00)
GFR calc Af Amer: >60 mL/min (ref >60)
GFR calc non Af Amer: >60 mL/min (ref >60)
GLUCOSE: 211 mg/dL — AB (ref 65–99)
POTASSIUM: 3.6 mmol/L (ref 3.5–5.1)
Sodium: 151 mmol/L — ABNORMAL HIGH (ref 135–145)

## 2017-05-04 LAB — CULTURE, BLOOD (ROUTINE X 2)
CULTURE: NO GROWTH
CULTURE: NO GROWTH
SPECIAL REQUESTS: ADEQUATE
Special Requests: ADEQUATE

## 2017-05-04 LAB — CBC
HEMATOCRIT: 40.6 % (ref 35.0–47.0)
HEMOGLOBIN: 13.5 g/dL (ref 12.0–16.0)
MCH: 28.1 pg (ref 26.0–34.0)
MCHC: 33.2 g/dL (ref 32.0–36.0)
MCV: 84.6 fL (ref 80.0–100.0)
Platelets: 229 10*3/uL (ref 150–440)
RBC: 4.8 MIL/uL (ref 3.80–5.20)
RDW: 13.7 % (ref 11.5–14.5)
WBC: 10.4 10*3/uL (ref 3.6–11.0)

## 2017-05-04 LAB — MAGNESIUM: Magnesium: 1.6 mg/dL — ABNORMAL LOW (ref 1.7–2.4)

## 2017-05-04 LAB — AMMONIA: Ammonia: 19 umol/L (ref 9–35)

## 2017-05-04 MED ORDER — FUROSEMIDE 10 MG/ML IJ SOLN
40.0000 mg | Freq: Two times a day (BID) | INTRAMUSCULAR | Status: DC
  Administered 2017-05-04 – 2017-05-07 (×7): 40 mg via INTRAVENOUS
  Filled 2017-05-04 (×7): qty 4

## 2017-05-04 MED ORDER — DEXTROSE 5 % IV SOLN
INTRAVENOUS | Status: DC
  Administered 2017-05-04: 17:00:00 via INTRAVENOUS

## 2017-05-04 MED ORDER — PANTOPRAZOLE SODIUM 40 MG PO TBEC
40.0000 mg | DELAYED_RELEASE_TABLET | Freq: Every day | ORAL | Status: DC
  Administered 2017-05-05 – 2017-05-07 (×3): 40 mg via ORAL
  Filled 2017-05-04 (×3): qty 1

## 2017-05-04 MED ORDER — ROPINIROLE HCL 1 MG PO TABS
0.5000 mg | ORAL_TABLET | Freq: Three times a day (TID) | ORAL | Status: DC
  Administered 2017-05-05 – 2017-05-07 (×7): 0.5 mg via ORAL
  Filled 2017-05-04 (×7): qty 1

## 2017-05-04 MED ORDER — METOPROLOL TARTRATE 5 MG/5ML IV SOLN
5.0000 mg | INTRAVENOUS | Status: AC
  Administered 2017-05-04: 5 mg via INTRAVENOUS
  Filled 2017-05-04: qty 5

## 2017-05-04 MED ORDER — VALPROATE SODIUM 500 MG/5ML IV SOLN
1000.0000 mg | Freq: Once | INTRAVENOUS | Status: AC
  Administered 2017-05-04: 1000 mg via INTRAVENOUS
  Filled 2017-05-04: qty 10

## 2017-05-04 MED ORDER — ALBUTEROL SULFATE (2.5 MG/3ML) 0.083% IN NEBU: 2.5000 mg | INHALATION_SOLUTION | RESPIRATORY_TRACT | Status: DC | PRN

## 2017-05-04 MED ORDER — DILTIAZEM HCL ER COATED BEADS 180 MG PO CP24
180.0000 mg | ORAL_CAPSULE | Freq: Every day | ORAL | Status: DC
  Filled 2017-05-04: qty 1

## 2017-05-04 MED ORDER — SENNOSIDES-DOCUSATE SODIUM 8.6-50 MG PO TABS
2.0000 | ORAL_TABLET | Freq: Two times a day (BID) | ORAL | Status: DC
  Administered 2017-05-05 – 2017-05-06 (×2): 2 via ORAL
  Filled 2017-05-04 (×2): qty 2

## 2017-05-04 MED ORDER — DILTIAZEM HCL ER COATED BEADS 180 MG PO CP24
180.0000 mg | ORAL_CAPSULE | Freq: Every day | ORAL | Status: DC
  Administered 2017-05-05 – 2017-05-07 (×3): 180 mg via ORAL
  Filled 2017-05-04 (×4): qty 1

## 2017-05-04 MED ORDER — ALBUTEROL SULFATE HFA 108 (90 BASE) MCG/ACT IN AERS: 2.0000 | INHALATION_SPRAY | RESPIRATORY_TRACT | Status: DC | PRN

## 2017-05-04 MED ORDER — CITALOPRAM HYDROBROMIDE 20 MG PO TABS
20.0000 mg | ORAL_TABLET | Freq: Every day | ORAL | Status: DC
  Administered 2017-05-05 – 2017-05-07 (×3): 20 mg via ORAL
  Filled 2017-05-04 (×3): qty 1

## 2017-05-04 MED ORDER — DIVALPROEX SODIUM 500 MG PO DR TAB
500.0000 mg | DELAYED_RELEASE_TABLET | Freq: Two times a day (BID) | ORAL | Status: DC
  Filled 2017-05-04: qty 1

## 2017-05-04 MED ORDER — GABAPENTIN 300 MG PO CAPS
300.0000 mg | ORAL_CAPSULE | Freq: Two times a day (BID) | ORAL | Status: DC
  Administered 2017-05-05 – 2017-05-07 (×5): 300 mg via ORAL
  Filled 2017-05-04 (×5): qty 1

## 2017-05-04 MED ORDER — MONTELUKAST SODIUM 10 MG PO TABS
10.0000 mg | ORAL_TABLET | Freq: Every day | ORAL | Status: DC
  Administered 2017-05-05 – 2017-05-06 (×2): 10 mg via ORAL
  Filled 2017-05-04 (×2): qty 1

## 2017-05-04 MED ORDER — IPRATROPIUM-ALBUTEROL 0.5-2.5 (3) MG/3ML IN SOLN
3.0000 mL | Freq: Four times a day (QID) | RESPIRATORY_TRACT | Status: DC
  Administered 2017-05-04 – 2017-05-06 (×8): 3 mL via RESPIRATORY_TRACT
  Filled 2017-05-04 (×8): qty 3

## 2017-05-04 MED ORDER — DEXTROSE 5 % IV SOLN
2.0000 g | Freq: Two times a day (BID) | INTRAVENOUS | Status: DC
  Administered 2017-05-04 – 2017-05-07 (×6): 2 g via INTRAVENOUS
  Filled 2017-05-04 (×7): qty 2

## 2017-05-04 MED ORDER — ROSUVASTATIN CALCIUM 10 MG PO TABS
10.0000 mg | ORAL_TABLET | Freq: Every day | ORAL | Status: DC
  Administered 2017-05-05 – 2017-05-07 (×3): 10 mg via ORAL
  Filled 2017-05-04 (×4): qty 1

## 2017-05-04 MED ORDER — MAGNESIUM SULFATE 2 GM/50ML IV SOLN
2.0000 g | Freq: Once | INTRAVENOUS | Status: AC
  Administered 2017-05-04: 2 g via INTRAVENOUS
  Filled 2017-05-04: qty 50

## 2017-05-04 MED ORDER — GLIPIZIDE ER 5 MG PO TB24
5.0000 mg | ORAL_TABLET | Freq: Every day | ORAL | Status: DC
  Filled 2017-05-04 (×2): qty 1

## 2017-05-04 MED ORDER — DEXTROSE 5 % IV SOLN
500.0000 mg | Freq: Two times a day (BID) | INTRAVENOUS | Status: DC
  Administered 2017-05-04 – 2017-05-06 (×4): 500 mg via INTRAVENOUS
  Filled 2017-05-04 (×5): qty 5

## 2017-05-04 MED ORDER — MOMETASONE FURO-FORMOTEROL FUM 200-5 MCG/ACT IN AERO
2.0000 | INHALATION_SPRAY | Freq: Two times a day (BID) | RESPIRATORY_TRACT | Status: DC
Start: 2017-05-04 — End: 2017-05-07
  Administered 2017-05-04 – 2017-05-06 (×4): 2 via RESPIRATORY_TRACT
  Filled 2017-05-04: qty 8.8

## 2017-05-04 NOTE — Progress Notes (Signed)
* [facility]     Assessment and Plan:  Acute hypoxic respiratory failure with pneumonia and atelectasis.  -Doing better today, currently oxygen saturation is 98% on 4 L. - Continue antibiotics. -Continue to advance activity as tolerated, agree with physical therapy consultation which may be helpful. - Discussed with RN regarding possible initiation of incentive spirometry, however she feels that the patient is unlikely to be able to benefit from this due to lack of understanding and coordination, therefore will hold off.  Date: 05/04/2017  MRN# [id number] [name] [birth year]   [name] is a 66 y.o. old female seen in follow up for chief complaint of  Chief Complaint  Patient presents with  . Respiratory Distress    Hx Seizures, one on EMS      HPI:   66 yo female with a PMH of Stroke, OSA, Seizures, Orthopnea, COPD-home O2 qhs, Neuropathy, Lymph Node Disorder, HTN, Hepatitis, GERD, Heart Murmur, Diabetes Mellitus, Depression, Congenital Deafness (she reads lips), Non-Alcoholic Cirrhosis, and Asthma. S/p extubation 04/30/17 with continued hypoxic  respiratory failure for pneumonia, currently on 4L [place], maxipime. Imaging personally reviewed; 05/03/17; bibasilar and LLL atelectasis.   Patient is awake and alert, she nods yes and no to basic questions.   Medication:    Current Facility-Administered Medications:  .  albuterol (PROVENTIL) (2.5 MG/3ML) 0.083% nebulizer solution 2.5 mg, 2.5 mg, Nebulization, Q4H PRN, Lenis Noon, RPH .  budesonide (PULMICORT) nebulizer solution 0.5 mg, 0.5 mg, Nebulization, BID, [name], Kurian, MD, 0.5 mg at 05/04/17 0810 .  ceFEPIme (MAXIPIME) 2 g in dextrose 5 % 50 mL IVPB, 2 g, Intravenous, Q12H, Lenis Noon, RPH, Stopped at 05/04/17 1014 .  citalopram (CELEXA) tablet 20 mg, 20 mg, Oral, Daily, Manuella Ghazi, Vipul, MD .  clopidogrel (PLAVIX) tablet 75 mg, 75 mg, Oral, Daily, Merton Border B, MD .  diltiazem (CARDIZEM  CD) 24 hr capsule 180 mg, 180 mg, Oral, Daily, Manuella Ghazi, Vipul, MD .  enoxaparin (LOVENOX) injection 40 mg, 40 mg, Subcutaneous, Q24H, Kasa, Kurian, MD, 40 mg at 05/03/17 2143 .  furosemide (LASIX) injection 40 mg, 40 mg, Intravenous, BID, Max Sane, MD, 40 mg at 05/04/17 0856 .  gabapentin (NEURONTIN) capsule 300 mg, 300 mg, Oral, BID, Manuella Ghazi, Vipul, MD .  glipiZIDE (GLUCOTROL XL) 24 hr tablet 5 mg, 5 mg, Oral, Daily, Manuella Ghazi, Vipul, MD .  hydrALAZINE (APRESOLINE) injection 10 mg, 10 mg, Intravenous, Q4H PRN, Patria Mane, Magadalene S, NP, 10 mg at 05/01/17 2237 .  insulin aspart (novoLOG) injection 0-5 Units, 0-5 Units, Subcutaneous, QHS, Wieting, Richard, MD .  insulin aspart (novoLOG) injection 0-9 Units, 0-9 Units, Subcutaneous, TID WC, Loletha Grayer, MD, 2 Units at 05/04/17 1233 .  ipratropium-albuterol (DUONEB) 0.5-2.5 (3) MG/3ML nebulizer solution 3 mL, 3 mL, Nebulization, Q6H, Manuella Ghazi, Vipul, MD .  lacosamide (VIMPAT) 200 mg in sodium chloride 0.9 % 25 mL IVPB, 200 mg, Intravenous, Q12H, Alexis Goodell, MD, Stopped at 05/04/17 1104 .  LORazepam (ATIVAN) injection 1-2 mg, 1-2 mg, Intravenous, Q2H PRN, Max Sane, MD, 2 mg at 04/30/17 0554 .  MEDLINE mouth rinse, 15 mL, Mouth Rinse, BID, Varughese, Bincy S, NP, 15 mL at 05/03/17 1000 .  mometasone-formoterol (DULERA) 200-5 MCG/ACT inhaler 2 puff, 2 puff, Inhalation, BID, Manuella Ghazi, Vipul, MD .  montelukast (SINGULAIR) tablet 10 mg, 10 mg, Oral, QHS, Shah, Vipul, MD .  ondansetron (ZOFRAN) injection 4 mg, 4 mg, Intravenous, Q6H PRN, Patria Mane, Magadalene S, NP, 4 mg at 05/02/17 0328 .  pantoprazole (PROTONIX) EC tablet 40 mg, 40 mg, Oral, Daily, Manuella Ghazi, Vipul, MD .  rOPINIRole (REQUIP) tablet 0.5 mg, 0.5 mg, Oral, TID, Manuella Ghazi, Vipul, MD .  rosuvastatin (CRESTOR) tablet 10 mg, 10 mg, Oral, Daily, Max Sane, MD   Allergies:  Aspirin; Celebrex [celecoxib]; Ciprofloxacin; Codeine; Fosphenytoin; Levaquin [levofloxacin in d5w]; Levofloxacin; Lovastatin; Penicillins;  Pravastatin; and Sulfa antibiotics  Review of Systems: Denied pain in chest, difficulty breathing.  Patient denied remainder of review of systems.  Physical Examination:   VS: BP (!) 143/67 (BP Location: Left Arm)   Pulse 76   Temp 98.7 F (37.1 C) (Oral)   Resp 16   Ht _0  (1.575 m)   Wt 170 lb 9.6 oz (77.4 kg)   SpO2 96%   BMI 31.20 kg/m    General Appearance: No distress  Neuro:without focal findings. HEENT: PERRLA, Pulmonary: normal breath sounds, No wheezing.   CardiovascularNormal S1,S2.  No m/r/g.   Abdomen: Benign, Soft, non-tender. Renal:  No costovertebral tenderness  GU:  Not performed at this time. Endoc: No evident thyromegaly, no signs of acromegaly. Skin:   warm, no rash. Extremities: normal, no cyanosis, clubbing.   LABORATORY PANEL:   CBC  Recent Labs Lab 05/04/17 0515  WBC 10.4  HGB 13.5  HCT 40.6  PLT 229   ------------------------------------------------------------------------------------------------------------------  Chemistries   Recent Labs Lab 04/30/17 0358  05/04/17 0515  NA 144  < > 151*  K 2.9*  < > 3.6  CL 106  < > 112*  CO2 22  < > 24  GLUCOSE 328*  < > 211*  BUN 23*  < > 21*  CREATININE 1.72*  < > 0.75  CALCIUM 8.2*  < > 8.9  MG 1.6*  < > 1.6*  AST 24  --   --   ALT 9*  --   --   ALKPHOS 88  --   --   BILITOT 0.5  --   --   < > = values in this interval not displayed. ------------------------------------------------------------------------------------------------------------------  Cardiac Enzymes  Recent Labs Lab 04/29/17 1115  TROPONINI 0.04*   ------------------------------------------------------------  RADIOLOGY:   No results found for this or any previous visit. Results for orders placed during the hospital encounter of 03/24/17  DG Chest 2 View   Narrative CLINICAL DATA:  Shortness of breath and cough  EXAM: CHEST  2 VIEW  COMPARISON:  02/11/2017  FINDINGS: Obscuration of the left  diaphragm since the previous exam. No pleural effusion. Stable cardiomediastinal silhouette with aortic atherosclerosis. No pneumothorax. Degenerative changes of the spine. Surgical clips in the right upper quadrant  IMPRESSION: 1. Left lower lobe opacity may reflect atelectasis or a pneumonia. Radiographic follow-up to document resolution is recommended.   Electronically Signed   By: Donavan Foil M.D.   On: 03/24/2017 17:57    ------------------------------------------------------------------------------------------------------------------  Thank  you for allowing [facility] to assist in the care of your patient. Our recommendations are noted above.  Please contact us if we can be of further service.   Marda Stalker, MD.  [facility] Office Number: [phone]  Patricia Pesa, M.D.  Merton Border, M.D  05/04/2017

## 2017-05-04 NOTE — Progress Notes (Signed)
Interdisciplinary Goals of Care Family Meeting    Date carried out:: 05/04/2017  Location of the meeting: Bedside  Member's involved: Physician and Family Member or next of kin  Durable Power of Attorney or acting medical decision maker: Son - [place], [place]  Diagnosis:   1. Acute on chronic hypoxic respiratory failure due to pneumonia. 2. Acute metabolic encephalopathy likely from prolonged seizure 3. Hypernatremia 4. History of cirrhosis 5. Lactic acidosis 6. Acute kidney injury 7. Hypokalemia  . Asthma   . Cirrhosis, non-alcoholic ([place])   . COPD (chronic obstructive pulmonary disease) ([place])   . Deaf   . Depression   . Diabetes mellitus without complication ([place])   . GERD (gastroesophageal reflux disease)   . Heart murmur   . Hepatitis   . Hypertension   . Lymph node disorder    arm  . Neuropathy   . On home oxygen therapy    hs  . Orthopnea   . RLS (restless legs syndrome)   . Seizures ([place])   . Shortness of breath dyspnea   . Sleep apnea   . Stroke [facility])    tia     Discussion: We discussed goals of care for [name] .    Code status: Limited Code or DNR with short term and Mechanical ventilation, will consult palliative care as she has had 3 admissions and 3 ED visits in last 78-month Disposition: SNF/LTAC  Time spent for the meeting: 2[place]10/29/2018 8:35 AM

## 2017-05-04 NOTE — Evaluation (Addendum)
Occupational Therapy Evaluation Patient Details Name: [name] MRN: [id number] DOB: Aug 11, 1950 Today's Date: 05/04/2017    History of Present Illness 66yo female pt presented to ER secondary to SOB with recurrent seizures (5-10) in route to hospital via EMS; admitted with acute/chronic respiratory failure and sepsis related to HCAP.  Intubated 10/24-10/25 for airway protection; now weaned to 4L supplemental O2 via [place].    Clinical Impression   Pt seen for OT evaluation this date. Nurse tech/sitter in room, no family present. Per RN, additional attempts at communication with ASL interpreter unsuccessful, as patient does not appear to attend to/interact/understand. Communication remains significant barrier at this point. No interpreter utilized for this evaluation. Per PT evaluation back in September 2018, pt was independent and living alone. No family/caregivers present and pt unable to provide PLOF. Pt presents with impaired cognition including unable to follow commands, difficulty with initiating and completing movements in isolation (mild improvement with more automatic functional/purposeful movements), impaired attention, safety, and impaired strength, activity tolerance, and at a higher risk of falls. Pt requires hand over hand assist for basic self care tasks including grooming and self feeding. Mild improvement noted with active participation in self feeding task with hand over hand when bringing spoon to mouth with pt able to take several small bites and actively opening mouth and negotiating bolus well with no overt s/s of aspiration for applesauce. Pt unable to follow commands, but when asked if she wanted more applesauce with simple sign language for "more" and verbal/visual cues, pt shook her head appropriately. Pt will benefit from skilled OT services to address noted impairments (please see additional detail below) and functional deficits in mobility and self care skills in order to  maximize return to PLOF and minimize risk of falls. Recommend transition to STR following hospitalization.    Follow Up Recommendations  SNF    Equipment Recommendations  3 in 1 bedside commode    Recommendations for Other Services       Precautions / Restrictions Precautions Precautions: Fall Restrictions Weight Bearing Restrictions: No      Mobility Bed Mobility Overal bed mobility: Needs Assistance             General bed mobility comments: per nurse tech/sitter in room, pt requires min-mod assist to complete bed mobility  Transfers Overall transfer level: Needs assistance Equipment used: Rolling walker (2 wheeled) Transfers: Sit to/from Stand Sit to Stand: Min assist;Mod assist         General transfer comment: hand-over-hand assist to initiate movement, direct transitions    Balance Overall balance assessment: Needs assistance Sitting-balance support: No upper extremity supported;Feet supported Sitting balance-Leahy Scale: Fair     Standing balance support: Single extremity supported Standing balance-Leahy Scale: Poor                             ADL either performed or assessed with clinical judgement   ADL Overall ADL's : Needs assistance/impaired Eating/Feeding: Bed level;Set up;Moderate assistance Eating/Feeding Details (indicate cue type and reason): long sitting in bed, hand over hand with mod assist to scoop and bring applesauce to mouth with some initiation of movement with increased time with pt able to open mouth and take small bites of applesauce with no overt s/s of aspiration/choking. When asked if she wanted more in sign language with increased time and visual cues, pt shook head no after several bites Grooming: Maximal assistance;Set up;Brushing hair;Cueing for sequencing;Bed level;Moderate assistance  Grooming Details (indicate cue type and reason): long sitting in bed, hand over hand with initial mod-max assist to support RUE and  hold brush to comb hair, decreasing to mod assist  Upper Body Bathing: Maximal assistance;Bed level   Lower Body Bathing: Maximal assistance;Bed level   Upper Body Dressing : Minimal assistance;Moderate assistance;Bed level   Lower Body Dressing: Bed level;Maximal assistance   Toilet Transfer: RW;Ambulation;BSC;Stand-pivot;Minimal assistance;Moderate assistance Toilet Transfer Details (indicate cue type and reason): tactile and visual cues to initiate movement, per PT and nurse tech                 Vision Baseline Vision/History: Wears glasses Wears Glasses: At all times (glasses noted in table, looked like bifocals) Patient Visual Report: Other (comment);No change from baseline (unable to verbalize vision history) Additional Comments: difficult to assess vision based on cognition     Perception     Praxis      Pertinent Vitals/Pain Pain Assessment: Faces Faces Pain Scale: No hurt     Hand Dominance     Extremity/Trunk Assessment Upper Extremity Assessment Upper Extremity Assessment: Generalized weakness (grossly at least 3/5 bilaterally with automatic functional/meaningful activities)   Lower Extremity Assessment Lower Extremity Assessment: Defer to PT evaluation;Generalized weakness   Cervical / Trunk Assessment Cervical / Trunk Assessment: Normal   Communication Communication Communication: Deaf (per previous evaluation, pt utilizes lip reading as primary means of communication)   Cognition Arousal/Alertness: Awake/alert Behavior During Therapy: WFL for tasks assessed/performed Overall Cognitive Status: Difficult to assess                                 General Comments: Significant difficulty following commands throughout session; requiring hand-over-hand assist to initiate/guide all functional activities and unable to maintain attention to brief tasks.   General Comments       Exercises     Shoulder Instructions      Home Living  Family/patient expects to be discharged to:: Private residence Living Arrangements: Alone   Type of Home: House Home Access: Level entry     Home Layout: One level               Home Equipment: Environmental consultant - 2 wheels;Kasandra Knudsen - single point   Additional Comments: Social history obtained from previous PT evaluation (Sept, 2018).  Patient unable to verify this date; will confirm with family as available.      Prior Functioning/Environment Level of Independence: Independent        Comments: Per previous evaluation (Sept 2018): reports she was fully independent, did not need AD for mobility prior to admission        OT Problem List: Decreased strength;Decreased coordination;Decreased range of motion;Decreased cognition;Decreased safety awareness;Decreased activity tolerance;Decreased knowledge of use of DME or AE;Impaired UE functional use      OT Treatment/Interventions: Self-care/ADL training;Therapeutic exercise;Therapeutic activities;DME and/or AE instruction;Patient/family education;Cognitive remediation/compensation    OT Goals(Current goals can be found in the care plan section) Acute Rehab OT Goals OT Goal Formulation: Patient unable to participate in goal setting  OT Frequency: Min 1X/week   Barriers to D/C: Decreased caregiver support          Co-evaluation              AM-PAC PT "6 Clicks" Daily Activity     Outcome Measure Help from another person eating meals?: A Lot Help from another person taking care of personal grooming?: A Lot  Help from another person toileting, which includes using toliet, bedpan, or urinal?: A Lot Help from another person bathing (including washing, rinsing, drying)?: A Lot Help from another person to put on and taking off regular upper body clothing?: A Lot Help from another person to put on and taking off regular lower body clothing?: A Lot 6 Click Score: 12   End of Session    Activity Tolerance: Patient tolerated treatment  well Patient left: in bed;with call bell/phone within reach;with bed alarm set;with nursing/sitter in room  OT Visit Diagnosis: Other abnormalities of gait and mobility (R26.89);Other symptoms and signs involving cognitive function;Muscle weakness (generalized) (M62.81)                Time: 1337-1350 OT Time Calculation (min): 13 min Charges:  OT General Charges $OT Visit: 1 Visit OT Evaluation $OT Eval Moderate Complexity: 1 Mod G-Codes: OT G-codes **NOT FOR INPATIENT CLASS** Functional Assessment Tool Used: AM-PAC 6 Clicks Daily Activity;Clinical judgement Functional Limitation: Self care Self Care Current Status (S3159): At least 60 percent but less than 80 percent impaired, limited or restricted Self Care Goal Status (Y5859): At least 40 percent but less than 60 percent impaired, limited or restricted   Jeni Salles, MPH, MS, OTR/L ascom [phone] 05/04/17, 2:20 PM

## 2017-05-04 NOTE — Progress Notes (Signed)
Pt unable to swallow meds. MD Manuella Ghazi notified. No new orders.

## 2017-05-04 NOTE — Evaluation (Addendum)
Clinical/Bedside Swallow Evaluation Patient Details  Name: [name] MRN: [id number] Date of Birth: [birth year]  Today's Date: 05/04/2017 Time: SLP Start Time (ACUTE ONLY): 1101 SLP Stop Time (ACUTE ONLY): 1201 SLP Time Calculation (min) (ACUTE ONLY): 60 min  Past Medical History:  Past Medical History:  Diagnosis Date  . Asthma   . Cirrhosis, non-alcoholic ([place])   . COPD (chronic obstructive pulmonary disease) ([place])   . Deaf   . Depression   . Diabetes mellitus without complication ([place])   . GERD (gastroesophageal reflux disease)   . Heart murmur   . Hepatitis   . Hypertension   . Lymph node disorder    arm  . Neuropathy   . On home oxygen therapy    hs  . Orthopnea   . RLS (restless legs syndrome)   . Seizures ([place])   . Shortness of breath dyspnea   . Sleep apnea   . Stroke [facility])    tia   Past Surgical History:  Past Surgical History:  Procedure Laterality Date  . CATARACT EXTRACTION W/PHACO Right 11/23/2014   Procedure: CATARACT EXTRACTION PHACO AND INTRAOCULAR LENS PLACEMENT (IOC);  Surgeon: Lyla Glassing, MD;  Location: [facility] ORS;  Service: Ophthalmology;  Laterality: Right;  . CATARACT EXTRACTION W/PHACO Left 12/14/2014   Procedure: CATARACT EXTRACTION PHACO AND INTRAOCULAR LENS PLACEMENT (IOC);  Surgeon: Lyla Glassing, MD;  Location: [facility] ORS;  Service: Ophthalmology;  Laterality: Left;  US:01:16.6 AP:15.8 CDE:12.14  . CESAREAN SECTION    . CHOLECYSTECTOMY    . KNEE ARTHROPLASTY    . THUMB ARTHROSCOPY    . TONSILLECTOMY    . TYMPANOPLASTY     muliple   HPI:  Pt is a 66 yo female with a PMH of Stroke, OSA, Seizures, Orthopnea, COPD-home O2 qhs, Neuropathy, Lymph Node Disorder, HTN, Hepatitis, GERD, Heart Murmur, Diabetes Mellitus, Depression, Congenital Deafness (she reads lips), Non-Alcoholic Cirrhosis, and Asthma.  She presented to [facility] ER 10/24 via EMS with respiratory distress and seizures.  Per ER notes the pt had a sizure at home at 11:10 am on  10/24, and then had a second seizure en route to the ER that lasted 5-10 seconds witnessed by EMS. Upon arrival to the ER she had two additional seizures and received a total of 3 mg ativan and was loaded with her home dose of Keppra and Neurology consulted.  Per ER notes the pt has not been taking her antiepileptics for "a while" because they made her sick.  In the ER lab results concerning for possible sepsis and chest xray concerning for possible health acquired pneumonia, therefore sepsis protocol initiated.  She was subsequently admitted to ICU by hospitalist team for further workup and treatment PCCM consulted.  Upon arrival to ICU she required mechanical intubation for airway protection due to multiple seizure activity.     Assessment / Plan / Recommendation Clinical Impression  Pt appeared to present w/ mild Oropharyngeal Dysphagia w/ an increased risk for aspiration. Pt is Deaf and required ST to gain attention and face pt to communicate. ST unsure of Cognitive status at this time - pt needs mod cues and follow through w/ tasks d/t inattention. Pt was given trials of ice chips, puree, thin liquid, and Nectar consistency liquid. During trials of ice chips, pt's oral phase appeared grossly WFL. No overt, immediate s/s of aspiration were noted. No decline in respiratory status was noted. During trials of thin liquid via cup, pt's oral phase was grossly WFL, however, pt had decreased  awareness of the tasks and needed mod cues and follow through - unsure of Cognitive status at this time. No overt, immediate s/s of aspiration were noted. No decline in respiratory status was noted. During trials of Nectar consistency liquid via straw, pt oral phase appeared grossly WFL, however, pt had lack of attention to tasks and needed follow through and cues. No overt, immediate s/s of aspiration were noted. No decline in respiratory status was noted. During trials of puree, pt oral phase appeared grossly WFL, however,  pt had lack of attention to tasks and needed follow through and cues. No overt, immediate s/s of aspiration were noted. No decline in respiratory status was noted. Pt required max assist during meals. Throughout the evaluation, pt appeared unaware of tasks and lost concentration during tasks - needed moderate cues for follow through. ST attempted min sign language, but unsure if pt understood. Pt was easily distracted by environment during evaluation.  D/t overall presentation and Cognitive status, Recommend Dysphagia level 1 (puree) w/ Nectar consistency liquids; strict aspiration precautions - sitting upright, small bites/sips, reduce environmental distractions. Recommend pills crushed in applesauce (if able) and max assistance during meals. Recommend dietician f/u.  ST services will f/u w/ pt's status, diet toleration, trials to upgrade as appropriate, and education while admitted. NSG/MD updated.  Addendum: pt was seen for reassessment of BSE d/t change in status per NSG report.  SLP Visit Diagnosis: Dysphagia, oropharyngeal phase (R13.12) (mild)    Aspiration Risk  Mild aspiration risk    Diet Recommendation   Dysphagia level 1 (puree) w/ Nectar consistency liquids; strict aspiration precautions.   Medication Administration: Crushed with puree (if able)    Other  Recommendations Recommended Consults:  (Dietician f/u) Oral Care Recommendations: Oral care BID;Staff/trained caregiver to provide oral care Other Recommendations: Order thickener from pharmacy   Follow up Recommendations  (TBD)      Frequency and Duration min 2x/week  1 week       Prognosis Prognosis for Safe Diet Advancement: Good Barriers to Reach Goals: Cognitive deficits (inattention; needs mod cues)      Swallow Study   General Date of Onset: 04/29/17 HPI: Pt is a 66 yo female with a PMH of Stroke, OSA, Seizures, Orthopnea, COPD-home O2 qhs, Neuropathy, Lymph Node Disorder, HTN, Hepatitis, GERD, Heart Murmur,  Diabetes Mellitus, Depression, Congenital Deafness (she reads lips), Non-Alcoholic Cirrhosis, and Asthma.  She presented to [facility] ER 10/24 via EMS with respiratory distress and seizures.  Per ER notes the pt had a sizure at home at 11:10 am on 10/24, and then had a second seizure en route to the ER that lasted 5-10 seconds witnessed by EMS. Upon arrival to the ER she had two additional seizures and received a total of 3 mg ativan and was loaded with her home dose of Keppra and Neurology consulted.  Per ER notes the pt has not been taking her antiepileptics for "a while" because they made her sick.  In the ER lab results concerning for possible sepsis and chest xray concerning for possible health acquired pneumonia, therefore sepsis protocol initiated.  She was subsequently admitted to ICU by hospitalist team for further workup and treatment PCCM consulted.  Upon arrival to ICU she required mechanical intubation for airway protection due to multiple seizure activity.   Type of Study: Bedside Swallow Evaluation Previous Swallow Assessment: None  Diet Prior to this Study: NPO Temperature Spikes Noted: No (wbc WNL) Respiratory Status: Nasal cannula (4 L) History of Recent Intubation:  Yes Length of Intubations (days): 1 days Date extubated: 04/30/17 Behavior/Cognition: Alert;Confused;Impulsive;Distractible;Requires cueing;Cooperative Oral Cavity Assessment: Within Functional Limits Oral Care Completed by SLP: Recent completion by staff Oral Cavity - Dentition: Dentures, bottom Vision: Functional for self-feeding Self-Feeding Abilities: Total assist Patient Positioning: Upright in bed Baseline Vocal Quality: Not observed Volitional Cough: Strong Volitional Swallow: Able to elicit    Oral/Motor/Sensory Function Overall Oral Motor/Sensory Function: Within functional limits (for bolus management)   Ice Chips Ice chips: Within functional limits Presentation: Spoon Other Comments:  (3 trials)   Thin  Liquid Thin Liquid: Within functional limits (3 trials; decreased awareness) Presentation: Cup Pharyngeal  Phase Impairments:  (none noted)    Nectar Thick Nectar Thick Liquid: Within functional limits Presentation: Straw (4 trials; mod cues for awareness) Pharyngeal Phase Impairments:  (none noted)   Honey Thick Honey Thick Liquid: Not tested   Puree Puree: Within functional limits (mod cues; lack of Cognitive awareness) Presentation: Spoon (4 trials)   Solid   GO   Solid: Not tested       Carolynn Sayers, SLP-Graduate Student Carolynn Sayers 05/04/2017,1:17 PM   This information has been reviewed and agreed upon by this supervising clinician.  This patient note, response to treatment and overall treatment plan has been reviewed and this clinician agrees with the information provided.  05/05/17, 3:08 PM [place], [place], CCC-SLP

## 2017-05-04 NOTE — Progress Notes (Signed)
Subjective: No further clinical seizure activity noted.  Patient not back to baseline.    Objective: Current vital signs: BP (!) 143/67 (BP Location: Left Arm)   Pulse 76   Temp 98.7 F (37.1 C) (Oral)   Resp 16   Ht _0  (1.575 m)   Wt 77.4 kg (170 lb 9.6 oz)   SpO2 95%   BMI 31.20 kg/m  Vital signs in last 24 hours: Temp:  [98.2 F (36.8 C)-99.4 F (37.4 C)] 98.7 F (37.1 C) (10/29 0757) Pulse Rate:  [76-102] 76 (10/29 1102) Resp:  [16] 16 (10/29 0757) BP: (134-147)/(64-78) 143/67 (10/29 0906) SpO2:  [92 %-96 %] 95 % (10/29 1356) Weight:  [77.4 kg (170 lb 9.6 oz)] 77.4 kg (170 lb 9.6 oz) (10/29 0757)  Intake/Output from previous day: 10/28 0701 - 10/29 0700 In: 125 [IV Piggyback:125] Out: -  Intake/Output this shift: Total I/O In: 0  Out: 1300 [Urine:1300] Nutritional status: DIET - DYS 1 Room service appropriate? Yes with Assist; Fluid consistency: Nectar Thick  Neurologic Exam: Mental Status: Alert.  No speech.  Does not follow commands.  Waves hello when I wave.   Cranial Nerves: II: Discs flat bilaterally; Blinks to bilateral confrontation.  Pupils equal, round, reactive to light and accommodation III,IV, VI: ptosis not present, extra-ocular motions intact bilaterally V,VII: smile symmetric, facial light touch sensation normal bilaterally VIII: deaf IX,X: unable to test XI: unable to test XII: unable to test Motor: Moves all extremities against gravity.  No focal weakness noted   Lab Results: Basic Metabolic Panel:  Recent Labs Lab 04/29/17 1115 04/29/17 1523 04/30/17 0358 05/01/17 0355 05/02/17 0530 05/04/17 0515  NA 140  --  144 143 146* 151*  K 2.1*  --  2.9* 4.2 3.4* 3.6  CL 95*  --  106 111 111 112*  CO2 30  --  _1 GLUCOSE 212*  --  328* 177* 124* 211*  BUN 27*  --  23* 21* 21* 21*  CREATININE 1.83*  --  1.72* 1.10* 0.69 0.75  CALCIUM 9.0  --  8.2* 8.3* 8.8* 8.9  MG  --  1.9 1.6* 2.2 1.9 1.6*  PHOS  --  3.5 3.2  --  2.2*  --      Liver Function Tests:  Recent Labs Lab 04/29/17 1115 04/30/17 0358  AST 20 24  ALT 10* 9*  ALKPHOS 94 88  BILITOT 0.6 0.5  PROT 7.0 6.0*  ALBUMIN 3.4* 2.9*   No results for input(s): LIPASE, AMYLASE in the last 168 hours.  Recent Labs Lab 04/29/17 1115 05/04/17 0515  AMMONIA 24 19    CBC:  Recent Labs Lab 04/29/17 1115 04/30/17 0358 05/01/17 0355 05/04/17 0515  WBC 21.8* 28.2* 10.0 10.4  NEUTROABS 18.5*  --   --   --   HGB 15.5 13.7 12.8 13.5  HCT 45.6 41.1 37.6 40.6  MCV 81.7 84.0 84.2 84.6  PLT 316 473* 202 229    Cardiac Enzymes:  Recent Labs Lab 04/29/17 1115  TROPONINI 0.04*    Lipid Panel:  Recent Labs Lab 04/29/17 1523  TRIG 133    CBG:  Recent Labs Lab 05/03/17 1136 05/03/17 1735 05/03/17 2105 05/04/17 0809 05/04/17 1148  GLUCAP 199* 238* 155* 211* 197*    Microbiology: Results for orders placed or performed during the hospital encounter of 04/29/17  Blood culture (routine x 2)     Status: None   Collection Time: 04/29/17 12:38 PM  Result  Value Ref Range Status   Specimen Description BLOOD RFOA  Final   Special Requests   Final    BOTTLES DRAWN AEROBIC AND ANAEROBIC Blood Culture adequate volume   Culture NO GROWTH 5 DAYS  Final   Report Status 05/04/2017 FINAL  Final  Blood culture (routine x 2)     Status: None   Collection Time: 04/29/17 12:39 PM  Result Value Ref Range Status   Specimen Description BLOOD RIGHT HAND  Final   Special Requests   Final    BOTTLES DRAWN AEROBIC AND ANAEROBIC Blood Culture adequate volume   Culture NO GROWTH 5 DAYS  Final   Report Status 05/04/2017 FINAL  Final  Culture, respiratory (NON-Expectorated)     Status: None   Collection Time: 04/29/17  3:00 PM  Result Value Ref Range Status   Specimen Description TRACHEAL ASPIRATE  Final   Special Requests NONE  Final   Gram Stain   Final    MODERATE WBC PRESENT, PREDOMINANTLY PMN FEW GRAM POSITIVE COCCI FEW GRAM POSITIVE RODS     Culture   Final    Consistent with normal respiratory flora. Performed at [facility], [place] [address], [place], [place] [address]    Report Status 05/02/2017 FINAL  Final  MRSA PCR Screening     Status: None   Collection Time: 04/29/17  4:00 PM  Result Value Ref Range Status   MRSA by PCR NEGATIVE NEGATIVE Final    Comment:        The GeneXpert MRSA Assay (FDA approved for NASAL specimens only), is one component of a comprehensive MRSA colonization surveillance program. It is not intended to diagnose MRSA infection nor to guide or monitor treatment for MRSA infections.     Coagulation Studies: No results for input(s): LABPROT, INR in the last 72 hours.  Imaging: Mr Jeri Cos WS Contrast  Result Date: 05/03/2017 CLINICAL DATA:  Noted with seizures. History of seizures with medications. Sent home. Acute encephalopathy box be related to prolonged seizure. EXAM: MRI HEAD WITHOUT AND WITH CONTRAST TECHNIQUE: Multiplanar, multiecho pulse sequences of the brain and surrounding structures were obtained without and with intravenous contrast. CONTRAST:  57m MULTIHANCE GADOBENATE DIMEGLUMINE 529 MG/ML IV SOLN COMPARISON:  04/04/2016 head CT.  05/17/2012 brain MRI FINDINGS: Brain: No acute infarction, hemorrhage, hydrocephalus, extra-axial collection or mass lesion. Mild periventricular chronic microvascular ischemic type change. Mild loss of cerebral volume since 2013, but still normal range. Motion degraded thin section coronal T2 weighted imaging of the hippocampus. A seizure focus is not identified. No abnormal intracranial enhancement. Vascular: Major flow voids are preserved. Prominent arachnoid granulation at the vertex in the superior sagittal sinus. Skull and upper cervical spine: Negative for marrow lesion. Sinuses/Orbits: Left mastoidectomy. Bilateral cataract resection. No acute finding. Other: Intermittently motion degraded, best obtainable in this altered patient. IMPRESSION: 1.  No acute or reversible finding.  Unremarkable brain MRI for age. 2. Intermittently motion degraded. Electronically Signed   By: JMonte FantasiaM.D.   On: 05/03/2017 17:47   Dg Chest Port 1 View  Result Date: 05/03/2017 CLINICAL DATA:  Patient poor historian at this time. Per patient's RN, patient O2 SAT's in low 80's on 6L of O2. Hx CVA, HTN, hepatitis, heart murmur, DM, COPD, cirrhosis, asthma. Current smoker. EXAM: PORTABLE CHEST 1 VIEW COMPARISON:  05/01/2017 FINDINGS: Heart size is accentuated by the portable AP technique. There is opacity at the left lung base, increased compared to prior study. Mild pulmonary vascular congestion has  increased compared to previous exam. Left IJ central line has been removed. IMPRESSION: Increased pulmonary vascular congestion. Increased left lower lobe infiltrate/consolidation. Electronically Signed   By: Nolon Nations M.D.   On: 05/03/2017 09:15    Medications:  I have reviewed the patient's current medications. Scheduled: . budesonide (PULMICORT) nebulizer solution  0.5 mg Nebulization BID  . citalopram  20 mg Oral Daily  . clopidogrel  75 mg Oral Daily  . diltiazem  180 mg Oral Daily  . divalproex  500 mg Oral Q12H  . enoxaparin (LOVENOX) injection  40 mg Subcutaneous Q24H  . furosemide  40 mg Intravenous BID  . gabapentin  300 mg Oral BID  . glipiZIDE  5 mg Oral Daily  . insulin aspart  0-5 Units Subcutaneous QHS  . insulin aspart  0-9 Units Subcutaneous TID WC  . ipratropium-albuterol  3 mL Nebulization Q6H  . mouth rinse  15 mL Mouth Rinse BID  . mometasone-formoterol  2 puff Inhalation BID  . montelukast  10 mg Oral QHS  . pantoprazole  40 mg Oral Daily  . rOPINIRole  0.5 mg Oral TID  . rosuvastatin  10 mg Oral Daily    Assessment/Plan: Patient presented with multiple seizures.  Vimpat at max dose.  EEG performed and showed frequent sharp activity.  May be underlying cause of lack of return to baseline.  MRI of the brain reviewed and  shows no acute changes.    Recommendations: 1.  Depakote 1069m IV now with maintenance of 5059mBID 2.  Depakote level in AM 3.  Continue seizure precautions.   LOS: 5 days   LeAlexis GoodellMD Neurology [phone]/29/2018  2:42 PM

## 2017-05-04 NOTE — NC FL2 (Signed)
[place] LEVEL OF CARE SCREENING TOOL     IDENTIFICATION  Patient Name: [name] Birthdate: [birth year] Sex: female Admission Date (Current Location): 04/29/2017  [place] and [place] Number:  Engineering geologist and Address:  [facility], [address] [place], [place] [address]      Provider Number: [id number]  Attending Physician Name and Address:  Max Sane, MD  Relative Name and Phone Number:  [place], [place] [phone] or Gigi Gin   [phone]     Current Level of Care: Hospital Recommended Level of Care: [place] Prior Approval Number:    Date Approved/Denied:   PASRR Number: [id number]  Discharge Plan: SNF    Current Diagnoses: Patient Active Problem List   Diagnosis Date Noted  . Status epilepticus ([place])   . HCAP (healthcare-associated pneumonia)   . Community acquired pneumonia of left lower lobe of lung ([place])   . Hypoxia   . Acute on chronic respiratory failure with hypoxia ([place]) 03/24/2017  . Hypokalemia 02/12/2017  . Sepsis ([place]) 02/11/2017  . GERD (gastroesophageal reflux disease) 03/23/2016  . Depression 03/23/2016  . Convulsions/seizures ([place]) 03/22/2016  . DM (diabetes mellitus), type 2 ([place]) 03/22/2016  . Essential hypertension 03/22/2016  . Neuropathy 03/22/2016  . Convulsions ([place]) 03/22/2016  . COPD exacerbation ([place]) 03/31/2015  . Acute respiratory failure with hypoxia ([place]) 03/29/2015    Orientation RESPIRATION BLADDER Height & Weight     Self, Situation, Place, Time  O2 (4L) Continent Weight: 170 lb 9.6 oz (77.4 kg) Height:  _0  (157.5 cm)  BEHAVIORAL SYMPTOMS/MOOD NEUROLOGICAL BOWEL NUTRITION STATUS    Convulsions/Seizures Continent Diet (Dysphagia 1 diet)  AMBULATORY STATUS COMMUNICATION OF NEEDS Skin   Limited Assist Non-Verbally Normal                       Personal Care Assistance Level of Assistance  Bathing, Feeding, Dressing  Bathing Assistance: Limited assistance Feeding assistance: Independent Dressing Assistance: Limited assistance     Functional Limitations Info  Sight, Hearing, Speech Sight Info: Adequate Hearing Info: Impaired Speech Info: Impaired    SPECIAL CARE FACTORS FREQUENCY  PT (By licensed PT)     PT Frequency: 5x a week              Contractures Contractures Info: Not present    Additional Factors Info  Psychotropic, Insulin Sliding Scale Code Status Info: DNR Allergies Info: ASPIRIN, CELEBREX CELECOXIB, CIPROFLOXACIN, CODEINE, FOSPHENYTOIN, LEVAQUIN LEVOFLOXACIN IN D5W, LEVOFLOXACIN, LOVASTATIN, PENICILLINS, PRAVASTATIN, SULFA ANTIBIOTICS  Psychotropic Info: citalopram (CELEXA) tablet 20 mg and divalproex (DEPAKOTE) DR tablet 500 mg  Insulin Sliding Scale Info: insulin aspart (novoLOG) injection 0-9 Units 3x a day with meals       Current Medications (05/04/2017):  This is the current hospital active medication list Current Facility-Administered Medications  Medication Dose Route Frequency Provider Last Rate Last Dose  . albuterol (PROVENTIL) (2.5 MG/3ML) 0.083% nebulizer solution 2.5 mg  2.5 mg Nebulization Q4H PRN Lenis Noon, RPH      . budesonide (PULMICORT) nebulizer solution 0.5 mg  0.5 mg Nebulization BID Flora Lipps, MD   0.5 mg at 05/04/17 0810  . ceFEPIme (MAXIPIME) 2 g in dextrose 5 % 50 mL IVPB  2 g Intravenous Q12H Lenis Noon, [facility]   Stopped at 05/04/17 1014  . citalopram (CELEXA) tablet 20 mg  20 mg Oral Daily Manuella Ghazi, Vipul, MD      . clopidogrel (PLAVIX) tablet 75 mg  75 mg Oral Daily Wilhelmina Mcardle, MD      . dextrose 5 % solution   Intravenous Continuous Max Sane, MD      . diltiazem (CARDIZEM CD) 24 hr capsule 180 mg  180 mg Oral Daily Manuella Ghazi, Vipul, MD      . divalproex (DEPAKOTE) DR tablet 500 mg  500 mg Oral Q12H Alexis Goodell, MD      . enoxaparin (LOVENOX) injection 40 mg  40 mg Subcutaneous Q24H Kasa, Kurian, MD   40 mg at 05/03/17 2143  .  furosemide (LASIX) injection 40 mg  40 mg Intravenous BID Max Sane, MD   40 mg at 05/04/17 0856  . gabapentin (NEURONTIN) capsule 300 mg  300 mg Oral BID Manuella Ghazi, Vipul, MD      . glipiZIDE (GLUCOTROL XL) 24 hr tablet 5 mg  5 mg Oral Daily Manuella Ghazi, Vipul, MD      . hydrALAZINE (APRESOLINE) injection 10 mg  10 mg Intravenous Q4H PRN Dorene Sorrow S, NP   10 mg at 05/01/17 2237  . insulin aspart (novoLOG) injection 0-5 Units  0-5 Units Subcutaneous QHS Wieting, Richard, MD      . insulin aspart (novoLOG) injection 0-9 Units  0-9 Units Subcutaneous TID WC Loletha Grayer, MD   2 Units at 05/04/17 1233  . ipratropium-albuterol (DUONEB) 0.5-2.5 (3) MG/3ML nebulizer solution 3 mL  3 mL Nebulization Q6H Manuella Ghazi, Vipul, MD   3 mL at 05/04/17 1356  . lacosamide (VIMPAT) 200 mg in sodium chloride 0.9 % 25 mL IVPB  200 mg Intravenous Q12H Alexis Goodell, MD   Stopped at 05/04/17 1104  . LORazepam (ATIVAN) injection 1-2 mg  1-2 mg Intravenous Q2H PRN Max Sane, MD   2 mg at 04/30/17 0554  . MEDLINE mouth rinse  15 mL Mouth Rinse BID Varughese, Bincy S, NP   15 mL at 05/03/17 1000  . mometasone-formoterol (DULERA) 200-5 MCG/ACT inhaler 2 puff  2 puff Inhalation BID Manuella Ghazi, Vipul, MD      . montelukast (SINGULAIR) tablet 10 mg  10 mg Oral QHS Manuella Ghazi, Vipul, MD      . ondansetron (ZOFRAN) injection 4 mg  4 mg Intravenous Q6H PRN Dorene Sorrow S, NP   4 mg at 05/02/17 0328  . pantoprazole (PROTONIX) EC tablet 40 mg  40 mg Oral Daily Manuella Ghazi, Vipul, MD      . rOPINIRole (REQUIP) tablet 0.5 mg  0.5 mg Oral TID Max Sane, MD      . rosuvastatin (CRESTOR) tablet 10 mg  10 mg Oral Daily Manuella Ghazi, Vipul, MD      . valproate (DEPACON) 1,000 mg in dextrose 5 % 50 mL IVPB  1,000 mg Intravenous Once Alexis Goodell, MD         Discharge Medications: Please see discharge summary for a list of discharge medications.  Relevant Imaging Results:  Relevant Lab Results:   Additional Information SSN  [id number]  Ross Ludwig, [place]

## 2017-05-04 NOTE — Progress Notes (Signed)
Decreased to 4l

## 2017-05-04 NOTE — Progress Notes (Signed)
Pharmacy consult: Drug interaction and monitoring of antiepileptic medications  Patient currently on Vimpat (lacosamide) 267m IV q12h, gabapentin 3061mbid. Patient medication list was analyzed and no clinically relevant interactions were found.   KrChinita GreenlandharmD Clinical Pharmacist 05/04/2017

## 2017-05-04 NOTE — Progress Notes (Signed)
CCMD called to notify pts HR 160's. MD Manuella Ghazi notified. MD placing orders.

## 2017-05-04 NOTE — Progress Notes (Addendum)
Inpatient Diabetes Program Recommendations  AACE/ADA: New Consensus Statement on Inpatient Glycemic Control (2015)  Target Ranges:  Prepandial:   less than 140 mg/dL      Peak postprandial:   less than 180 mg/dL (1-2 hours)      Critically ill patients:  140 - 180 mg/dL   Results for [name], [name] (MRN [id number]) as of 05/04/2017 09:30  Ref. Range 05/03/2017 08:10 05/03/2017 11:36 05/03/2017 17:35 05/03/2017 21:05 05/04/2017 08:09  Glucose-Capillary Latest Ref Range: 65 - 99 mg/dL 224 (H) 199 (H) 238 (H) 155 (H) 211 (H)   Review of Glycemic Control  Diabetes history: DM2 Outpatient Diabetes medications: Glipizide XL 5 mg daily Current orders for Inpatient glycemic control: Glipizide XL 5 mg daily, Novolog 0-9 units TID with meals, Novolog 0-5 units QHS  Inpatient Diabetes Program Recommendations: Oral Agents: Noted Glipizide XL 5 mg daily was ordered this morning. HgbA1C: Please order an A1C to evaluate glycemic control over the past 2-3 months.  NOTE: Noted consult for Diabetes Coordinator. Chart reviewed. Agree with current inpatient glycemic control orders. Recommend ordering a current A1C.  Thanks, Barnie Alderman, RN, MSN, CDE Diabetes Coordinator Inpatient Diabetes Program [phone] (Team Pager from 8am to 5pm)

## 2017-05-04 NOTE — Progress Notes (Signed)
Patient ID: [name], female   DOB: 1951/04/26, 66 y.o.   MRN: [id number]  [facility] PROGRESS NOTE  [name] IPP:[id number] DOB: 15-Apr-1951 DOA: 04/29/2017 PCP: Cletis Athens, MD  HPI/Subjective: Patient is awake and does track me across the room.  I  amable to get some response from her with the sign language -son at bedside.  Family states that she normally does sign language very well and he is able to get her to do sign language interpretation somewhat while here  Objective: Vitals:   05/04/17 0426 05/04/17 0757  BP: (!) 141/66 (!) 143/76  Pulse: 83 90  Resp: 16 16  Temp: 98.2 F (36.8 C) 98.7 F (37.1 C)  SpO2: 96% 95%    Filed Weights   04/30/17 0500 05/01/17 0354 05/04/17 0757  Weight: 74.9 kg (165 lb 2 oz) 81.4 kg (179 lb 7.3 oz) 77.4 kg (170 lb 9.6 oz)    ROS: Review of Systems  Constitutional: Negative for chills, fever and weight loss.  HENT: Negative for nosebleeds and sore throat.   Eyes: Negative for blurred vision.  Respiratory: Negative for cough, shortness of breath and wheezing.   Cardiovascular: Negative for chest pain, orthopnea, leg swelling and PND.  Gastrointestinal: Negative for abdominal pain, constipation, diarrhea, heartburn, nausea and vomiting.  Genitourinary: Negative for dysuria and urgency.  Musculoskeletal: Negative for back pain.  Skin: Negative for rash.  Neurological: Negative for dizziness, speech change, focal weakness and headaches.  Endo/Heme/Allergies: Does not bruise/bleed easily.  Psychiatric/Behavioral: Negative for depression.   Exam: Physical Exam  HENT:  Nose: No mucosal edema.  Throat dry  Eyes: Pupils are equal, round, and reactive to light. Conjunctivae and lids are normal.  Neck: Carotid bruit is not present. No thyromegaly present.  Cardiovascular: Regular rhythm, S1 normal, S2 normal and normal heart sounds.   Respiratory: She has decreased breath sounds in the right lower field and the left lower  field. She has no wheezes. She has rhonchi in the right middle field and the left middle field. She has rales in the right lower field and the left lower field.  GI: Soft. Bowel sounds are normal. There is no tenderness.  Musculoskeletal:       Right ankle: She exhibits no swelling.       Left ankle: She exhibits no swelling.  Neurological: She is alert.  Moves her extremities on her own.  Was able to sit up for me  Skin: Skin is warm. No rash noted.  Psychiatric:  Alert and tracks me across the room.  Does not talk.  Follow some sign language.  Did say bye to her son      Data Reviewed: Basic Metabolic Panel:  Recent Labs Lab 04/29/17 1115 04/29/17 1523 04/30/17 0358 05/01/17 0355 05/02/17 0530 05/04/17 0515  NA 140  --  144 143 146* 151*  K 2.1*  --  2.9* 4.2 3.4* 3.6  CL 95*  --  106 111 111 112*  CO2 30  --  _0 GLUCOSE 212*  --  328* 177* 124* 211*  BUN 27*  --  23* 21* 21* 21*  CREATININE 1.83*  --  1.72* 1.10* 0.69 0.75  CALCIUM 9.0  --  8.2* 8.3* 8.8* 8.9  MG  --  1.9 1.6* 2.2 1.9 1.6*  PHOS  --  3.5 3.2  --  2.2*  --    Liver Function Tests:  Recent Labs Lab 04/29/17 1115 04/30/17 0358  AST 20 24  ALT 10* 9*  ALKPHOS 94 88  BILITOT 0.6 0.5  PROT 7.0 6.0*  ALBUMIN 3.4* 2.9*    Recent Labs Lab 04/29/17 1115 05/04/17 0515  AMMONIA 24 19   CBC:  Recent Labs Lab 04/29/17 1115 04/30/17 0358 05/01/17 0355 05/04/17 0515  WBC 21.8* 28.2* 10.0 10.4  NEUTROABS 18.5*  --   --   --   HGB 15.5 13.7 12.8 13.5  HCT 45.6 41.1 37.6 40.6  MCV 81.7 84.0 84.2 84.6  PLT 316 473* 202 229   Cardiac Enzymes:  Recent Labs Lab 04/29/17 1115  TROPONINI 0.04*   BNP (last 3 results)  Recent Labs  02/06/17 1109 03/24/17 1738  BNP 50.0 184.0*     CBG:  Recent Labs Lab 05/03/17 0810 05/03/17 1136 05/03/17 1735 05/03/17 2105 05/04/17 0809  GLUCAP 224* 199* 238* 155* 211*       Studies: Mr Jeri Cos Wo Contrast  Result Date:  05/03/2017 CLINICAL DATA:  Noted with seizures. History of seizures with medications. Sent home. Acute encephalopathy box be related to prolonged seizure. EXAM: MRI HEAD WITHOUT AND WITH CONTRAST TECHNIQUE: Multiplanar, multiecho pulse sequences of the brain and surrounding structures were obtained without and with intravenous contrast. CONTRAST:  70m MULTIHANCE GADOBENATE DIMEGLUMINE 529 MG/ML IV SOLN COMPARISON:  04/04/2016 head CT.  05/17/2012 brain MRI FINDINGS: Brain: No acute infarction, hemorrhage, hydrocephalus, extra-axial collection or mass lesion. Mild periventricular chronic microvascular ischemic type change. Mild loss of cerebral volume since 2013, but still normal range. Motion degraded thin section coronal T2 weighted imaging of the hippocampus. A seizure focus is not identified. No abnormal intracranial enhancement. Vascular: Major flow voids are preserved. Prominent arachnoid granulation at the vertex in the superior sagittal sinus. Skull and upper cervical spine: Negative for marrow lesion. Sinuses/Orbits: Left mastoidectomy. Bilateral cataract resection. No acute finding. Other: Intermittently motion degraded, best obtainable in this altered patient. IMPRESSION: 1. No acute or reversible finding.  Unremarkable brain MRI for age. 2. Intermittently motion degraded. Electronically Signed   By: JMonte FantasiaM.D.   On: 05/03/2017 17:47   Dg Chest Port 1 View  Result Date: 05/03/2017 CLINICAL DATA:  Patient poor historian at this time. Per patient's RN, patient O2 SAT's in low 80's on 6L of O2. Hx CVA, HTN, hepatitis, heart murmur, DM, COPD, cirrhosis, asthma. Current smoker. EXAM: PORTABLE CHEST 1 VIEW COMPARISON:  05/01/2017 FINDINGS: Heart size is accentuated by the portable AP technique. There is opacity at the left lung base, increased compared to prior study. Mild pulmonary vascular congestion has increased compared to previous exam. Left IJ central line has been removed. IMPRESSION:  Increased pulmonary vascular congestion. Increased left lower lobe infiltrate/consolidation. Electronically Signed   By: ENolon NationsM.D.   On: 05/03/2017 09:15    Scheduled Meds: . budesonide (PULMICORT) nebulizer solution  0.5 mg Nebulization BID  . citalopram  20 mg Oral Daily  . clopidogrel  75 mg Oral Daily  . enoxaparin (LOVENOX) injection  40 mg Subcutaneous Q24H  . furosemide  40 mg Intravenous BID  . gabapentin  300 mg Oral BID  . glipiZIDE  5 mg Oral Daily  . insulin aspart  0-5 Units Subcutaneous QHS  . insulin aspart  0-9 Units Subcutaneous TID WC  . ipratropium-albuterol  3 mL Nebulization Q4H  . mouth rinse  15 mL Mouth Rinse BID  . mometasone-formoterol  2 puff Inhalation BID  . montelukast  10 mg Oral QHS  . pantoprazole  40 mg Oral Daily  . rOPINIRole  0.5 mg Oral TID  . rosuvastatin  10 mg Oral Daily   Continuous Infusions: . ceFEPime (MAXIPIME) IV Stopped (05/03/17 2212)  . lacosamide (VIMPAT) IV Stopped (05/03/17 1143)    Assessment/Plan:  1. Acute on chronic respiratory failure with hypoxia on oxygen.  Status post extubation 04/30/2017. Patient is on 6 L nasal cannula. Continue Maxipime for pneumonia. 2. Acute metabolic encephalopathy likely from prolonged seizure.  EEG showing triphasic waves which is likely metabolic in nature. MRI of the brain with and without contrast shows no acute pathology.  Patient waiting for swallow evaluation today 3. Seizure disorder: On IV Vimpat while here. 4. Hypernatremia: Likely iatrogenic, will start IV Lasix 40 mg twice a day as she is also +3 L fluid balance. Strict I's and O's 5. Type 2 diabetes mellitus on sliding scale, will add oral medication if she starts eating and clear swallow evaluation 6. History of cirrhosis: Ammonia level of 19 7. Lactic acidosis improved with IV fluids. 8. Acute kidney injury.  Improved with IV fluids. 9. Hypokalemia replaced  Code Status: DNR  Family Communication: Spoke with son at  the bedside. Disposition Plan: Skilled nursing facility.  Discussed with son who is in agreement with placement will consult social worker  Consultants:  Neurology  Antibiotics:  Maxipime  Time spent: 30 minutes.   Miraya Cudney [employer]

## 2017-05-04 NOTE — Progress Notes (Signed)
Decreased to 2l after neb treatment

## 2017-05-04 NOTE — Progress Notes (Signed)
CNA in room feeding pt, after first small bite, pt started gagging. Vital signs stable,no stridor evident.  MD Manuella Ghazi notified, per MD make pt NPO and notify pharmacy to change seizures meds to IV. Kristen in pharmacy notified.

## 2017-05-04 NOTE — Clinical Social Work Note (Signed)
Clinical Social Work Assessment  Patient Details  Name: [name] MRN: [id number] Date of Birth: 06-Aug-1950  Date of referral:  05/04/17               Reason for consult:  Facility Placement                Permission sought to share information with:  Facility Sport and exercise psychologist, Family Supports Permission granted to share information::  Yes, Verbal Permission Granted  Name::     [place], [place] [phone]   Agency::  SNF admissions  Relationship::     Contact Information:     Housing/Transportation Living arrangements for the past 2 months:  [place] of Information:  Adult Children Patient Interpreter Needed:  Sign Language Criminal Activity/Legal Involvement Pertinent to Current Situation/Hospitalization:  No - Comment as needed Significant Relationships:  Adult Children Lives with:  Self Do you feel safe going back to the place where you live?  No Need for family participation in patient care:  Yes (Comment)  Care giving concerns:  Patient and family feel she needs some short term rehab before she is able to return back home.   Social Worker assessment / plan:  Patient is a 66 year old female who is alert and oriented x4 based on expressions when CSW speaks to her.  Patient is nonverbal, CSW spoke to patient's son to complete assessment.  Patient lives alone, patient's family is trying to buy a new house so she can move in with them.  Patient has not been to rehab in the past, CSW explained to patient's son what to expect at SNF and how insurance will pay for stay.  Patient son is in agreement to having patient go to a SNF for short term rehab and gave CSW permission to begin bed search in [place].  Employment status:  Retired Nurse, adult PT Recommendations:  [place] / Referral to community resources:  [place]  Patient/Family's Response to care: Patient's family in  agreement to going to SNF for short term rehab.  Patient/Family's Understanding of and Emotional Response to Diagnosis, Current Treatment, and Prognosis:  Patient's family is aware of current prognosis and treatment plan.  Emotional Assessment Appearance:  Appears stated age Attitude/Demeanor/Rapport:    Affect (typically observed):  Calm, Stable Orientation:  Oriented to Self, Oriented to Place, Oriented to  Time, Oriented to Situation Alcohol / Substance use:  Not Applicable Psych involvement (Current and /or in the community):  No (Comment)  Discharge Needs  Concerns to be addressed:    Readmission within the last 30 days:  No Current discharge risk:  Lack of support system, Lives alone Barriers to Discharge:  Continued Medical Work up   Anell Barr 05/04/2017, 5:04 PM

## 2017-05-04 NOTE — Consult Note (Signed)
[facility] Consultation Note  Patient ID: [name], MRN: [id number], DOB/AGE: 09-Nov-1950 66 y.o. Admit date: 04/29/2017   Date of Consult: 05/04/2017 Primary Physician: Cletis Athens, MD Primary Cardiologist: None  Chief Complaint:  Chief Complaint  Patient presents with  . Respiratory Distress    Hx Seizures, one on EMS    Reason for Consult: HPI    Respiratory Distress   Additional comments: Hx Seizures, one on EMS      Last edited by Fabian November, RN on 04/29/2017 11:10 AM. (History)    Supraventricular tachycardia  HPI: 66 y.o. female with the known essential hypertension mixed hyperlipidemia diabetes with complication sleep apnea with previous stroke and COPD currently on appropriate medication management when she had an acute pneumonia and respiratory failure. The patient was with sepsis in the intensive care unit for multiple days and has slowly improved with rehabilitation and medication management. The patient has been on appropriate medication management for her hyperlipidemia including Crestor her blood pressure including diltiazem and no peripheral vascular disease and stroke with antiplatelet medication management. The patient has gotten up to the commode for further rehabilitation and had a short run of supraventricular tachycardia of 160 bpm consistent with SVT and no current evidence of atrial fibrillation. Currently she is back in normal rhythm and stable with a heart rate of 80 bpm. This appears to be an isolated incident with no significant symptoms or concerns of need for further treatment other than diltiazem. The patient has been recovering slowly and will not need further diagnostic testing  Past Medical History:  Diagnosis Date  . Asthma   . Cirrhosis, non-alcoholic ([place])   . COPD (chronic obstructive pulmonary disease) ([place])   . Deaf   . Depression   . Diabetes mellitus without complication ([place])   . GERD (gastroesophageal reflux  disease)   . Heart murmur   . Hepatitis   . Hypertension   . Lymph node disorder    arm  . Neuropathy   . On home oxygen therapy    hs  . Orthopnea   . RLS (restless legs syndrome)   . Seizures ([place])   . Shortness of breath dyspnea   . Sleep apnea   . Stroke [facility])    tia      Surgical History:  Past Surgical History:  Procedure Laterality Date  . CATARACT EXTRACTION W/PHACO Right 11/23/2014   Procedure: CATARACT EXTRACTION PHACO AND INTRAOCULAR LENS PLACEMENT (IOC);  Surgeon: Lyla Glassing, MD;  Location: [facility] ORS;  Service: Ophthalmology;  Laterality: Right;  . CATARACT EXTRACTION W/PHACO Left 12/14/2014   Procedure: CATARACT EXTRACTION PHACO AND INTRAOCULAR LENS PLACEMENT (IOC);  Surgeon: Lyla Glassing, MD;  Location: [facility] ORS;  Service: Ophthalmology;  Laterality: Left;  US:01:16.6 AP:15.8 CDE:12.14  . CESAREAN SECTION    . CHOLECYSTECTOMY    . KNEE ARTHROPLASTY    . THUMB ARTHROSCOPY    . TONSILLECTOMY    . TYMPANOPLASTY     muliple     Home Meds: Prior to Admission medications   Medication Sig Start Date End Date Taking? Authorizing Provider  clopidogrel (PLAVIX) 75 MG tablet Take 1 tablet (75 mg total) by mouth daily. 07/17/16  Yes Vaughan Basta, MD  diltiazem (DILACOR XR) 180 MG 24 hr capsule Take 180 mg by mouth daily.   Yes [provider]  furosemide (LASIX) 40 MG tablet Take 1 tablet (40 mg total) by mouth daily. Patient taking differently: Take 20 mg by mouth daily.  07/17/16  Yes Vaughan Basta, MD  gabapentin (NEURONTIN) 300 MG capsule Take 1 capsule by mouth 2 (two) times daily.  01/16/16  Yes [provider]  GLIPIZIDE XL 5 MG 24 hr tablet Take 2 tablets (10 mg total) by mouth daily. Patient taking differently: Take 5 mg by mouth daily.  02/12/17  Yes Max Sane, MD  lacosamide 150 MG TABS Take 1 tablet (150 mg total) by mouth 2 (two) times daily. 03/30/17  Yes Demetrios Loll, MD  losartan-hydrochlorothiazide [facility]) 100-12.5  MG tablet Take 1 tablet by mouth daily.   Yes [provider]  montelukast (SINGULAIR) 10 MG tablet Take 1 tablet by mouth at bedtime. 03/18/16  Yes [provider]  omeprazole (PRILOSEC) 20 MG capsule Take 20 mg by mouth daily.   Yes [provider]  rosuvastatin (CRESTOR) 10 MG tablet Take 10 mg by mouth daily. 04/21/17  Yes [provider]  triamcinolone ointment (KENALOG) 0.1 % USE 1 (ONE) APPLICATION TWO TIMES DAILY 04/21/17  Yes [provider]  albuterol (PROVENTIL HFA;VENTOLIN HFA) 108 (90 BASE) MCG/ACT inhaler Inhale 2 puffs into the lungs every 4 (four) hours as needed for wheezing or shortness of breath.    [provider]  albuterol (PROVENTIL) (2.5 MG/3ML) 0.083% nebulizer solution Take 3 mLs (2.5 mg total) by nebulization every 4 (four) hours as needed for wheezing or shortness of breath. Patient not taking: Reported on 04/29/2017 07/17/16   Vaughan Basta, MD  citalopram (CELEXA) 20 MG tablet Take 1 tablet (20 mg total) by mouth daily. Patient not taking: Reported on 04/29/2017 07/17/16   Vaughan Basta, MD  clindamycin (CLEOCIN) 300 MG capsule Take 2 capsules (600 mg total) by mouth every 8 (eight) hours. Patient not taking: Reported on 04/29/2017 03/30/17   Demetrios Loll, MD  EPINEPHrine 0.3 mg/0.3 mL IJ SOAJ injection Inject into the muscle once.    [provider]  guaiFENesin-dextromethorphan (ROBITUSSIN DM) 100-10 MG/5ML syrup Take 5 mLs by mouth every 6 (six) hours as needed for cough. 03/30/17   Demetrios Loll, MD  mometasone-formoterol [facility]) 200-5 MCG/ACT AERO Inhale 2 puffs into the lungs 2 (two) times daily. 07/17/16   Vaughan Basta, MD  rOPINIRole (REQUIP) 0.25 MG tablet Take 1 tablet (0.25 mg total) by mouth 3 (three) times daily. 03/24/16   Oswald Hillock, MD    Inpatient Medications:  . budesonide (PULMICORT) nebulizer solution  0.5 mg Nebulization BID  . citalopram  20 mg Oral Daily  .  clopidogrel  75 mg Oral Daily  . diltiazem  180 mg Oral Daily  . enoxaparin (LOVENOX) injection  40 mg Subcutaneous Q24H  . furosemide  40 mg Intravenous BID  . gabapentin  300 mg Oral BID  . glipiZIDE  5 mg Oral Daily  . insulin aspart  0-5 Units Subcutaneous QHS  . insulin aspart  0-9 Units Subcutaneous TID WC  . ipratropium-albuterol  3 mL Nebulization Q6H  . mouth rinse  15 mL Mouth Rinse BID  . mometasone-formoterol  2 puff Inhalation BID  . montelukast  10 mg Oral QHS  . pantoprazole  40 mg Oral Daily  . rOPINIRole  0.5 mg Oral TID  . rosuvastatin  10 mg Oral Daily   . ceFEPime (MAXIPIME) IV Stopped (05/04/17 1014)  . lacosamide (VIMPAT) IV Stopped (05/04/17 1104)    Allergies:  Allergies  Allergen Reactions  . Aspirin Itching  . Celebrex [Celecoxib] Itching    itching  . Ciprofloxacin Itching  . Codeine Itching  . Fosphenytoin  Itching  . Levaquin [Levofloxacin In D5w] Itching  . Levofloxacin Itching  . Lovastatin Itching  . Penicillins     Documentation indicates severe reaction  Pt tolerated cephalosporin without adverse reaction 09/18   . Pravastatin Itching  . Sulfa Antibiotics Itching    Social History   Social History  . Marital status: Married    Spouse name: N/A  . Number of children: N/A  . Years of education: N/A   Occupational History  . Not on file.   Social History Main Topics  . Smoking status: Current Every Day Smoker    Packs/day: 0.50  . Smokeless tobacco: Never Used  . Alcohol use No  . Drug use: No  . Sexual activity: Not on file   Other Topics Concern  . Not on file   Social History Narrative  . No narrative on file     Family History  Problem Relation Age of Onset  . Lung cancer Mother   . CAD Father      Review of Systems Positive for shortness of breath cough congestion Negative for: General:  chills, fever, night sweats or weight changes.  Cardiovascular: PND orthopnea syncope dizziness  Dermatological skin  lesions rashes Respiratory: Positive for Cough congestion Urologic: Frequent urination urination at night and hematuria Abdominal: negative for nausea, vomiting, diarrhea, bright red blood per rectum, melena, or hematemesis Neurologic: negative for visual changes, and/or hearing changes  All other systems reviewed and are otherwise negative except as noted above.  Labs: No results for input(s): CKTOTAL, CKMB, TROPONINI in the last 72 hours. Lab Results  Component Value Date   WBC 10.4 05/04/2017   HGB 13.5 05/04/2017   HCT 40.6 05/04/2017   MCV 84.6 05/04/2017   PLT 229 05/04/2017    Recent Labs Lab 04/30/17 0358  05/04/17 0515  NA 144  < > 151*  K 2.9*  < > 3.6  CL 106  < > 112*  CO2 22  < > 24  BUN 23*  < > 21*  CREATININE 1.72*  < > 0.75  CALCIUM 8.2*  < > 8.9  PROT 6.0*  --   --   BILITOT 0.5  --   --   ALKPHOS 88  --   --   ALT 9*  --   --   AST 24  --   --   GLUCOSE 328*  < > 211*  < > = values in this interval not displayed. Lab Results  Component Value Date   TRIG 133 04/29/2017   No results found for: DDIMER  Radiology/Studies:  Dg Chest 1 View  Result Date: 04/29/2017 CLINICAL DATA:  Feeding tube placement EXAM: CHEST 1 VIEW COMPARISON:  Portable exam 1636 hours compared to 1532 hours FINDINGS: Tip of endotracheal tube projects 3.8 cm above carina. Nasogastric tube extends into abdomen. Normal heart size, mediastinal contours and pulmonary vascularity. Atherosclerotic calcifications aorta. Persistent infiltrate/consolidation LEFT lower lobe Subsegmental atelectasis RIGHT base. Slightly increased markings in RIGHT upper lobe since previous exam cannot exclude developing mild infiltrate. Minimal central peribronchial thickening. No gross pleural effusion or pneumothorax. IMPRESSION: Persistent LEFT lower lobe consolidation consistent with pneumonia. RIGHT basilar atelectasis with question developing mild RIGHT upper lobe infiltrate. Electronically Signed   By:  Lavonia Dana M.D.   On: 04/29/2017 16:54   Ct Angio Chest Pe W And/or Wo Contrast  Result Date: 04/05/2017 CLINICAL DATA:  Lightheadedness 2 days with diarrhea and productive cough. EXAM: CT ANGIOGRAPHY CHEST WITH CONTRAST TECHNIQUE:  Multidetector CT imaging of the chest was performed using the standard protocol during bolus administration of intravenous contrast. Multiplanar CT image reconstructions and MIPs were obtained to evaluate the vascular anatomy. CONTRAST:  60 mL Isovue 370 IV. COMPARISON:  abdominopelvic CT 02/06/2017 and chest CT 07/14/2016 as well as 11/05/2013 FINDINGS: Cardiovascular: Heart is normal size. Suggestion minimal calcified plaque over the left main and left anterior descending coronary artery is as well as right coronary artery. Mild calcified plaque over the thoracic aorta. No evidence of pulmonary embolism. Mediastinum/Nodes: No significant mediastinal or hilar adenopathy. Remaining mediastinal structures are unremarkable. Lungs/Pleura: Lungs are adequately inflated and demonstrate near total collapse of the left lower lobe and mild linear atelectasis over the lingula. There is heterogeneous material within the right lower lobe bronchus and minimally over the left midlung bronchi proximally. This may be due to aspiration versus mucous plugging. These findings are new compared to 02/06/2017. Tiny amount of left pleural fluid. 3 mm nodule over the medial right upper lobe unchanged from 2015. Upper Abdomen: Previous cholecystectomy. Mild nodular contour of the liver. Atherosclerotic plaque over the abdominal aorta. Musculoskeletal: Degenerative change of the spine. Review of the MIP images confirms the above findings. IMPRESSION: No evidence of acute pulmonary embolism. Near complete collapse of the left lower lobe with linear atelectasis over the lingula. Obstruction of the prox left lower lobe bronchus with mild heterogeneous material also seen within the proximal left midlung bronchi  which may be due to aspiration versus mucous plugging. Node definite mass or adenopathy visualized. Aortic Atherosclerosis (ICD10-I70.0). Mild atherosclerotic disease of the coronary arteries as described. Electronically Signed   By: Marin Olp M.D.   On: 04/05/2017 21:27   Mr Jeri Cos LX Contrast  Result Date: 05/03/2017 CLINICAL DATA:  Noted with seizures. History of seizures with medications. Sent home. Acute encephalopathy box be related to prolonged seizure. EXAM: MRI HEAD WITHOUT AND WITH CONTRAST TECHNIQUE: Multiplanar, multiecho pulse sequences of the brain and surrounding structures were obtained without and with intravenous contrast. CONTRAST:  2m MULTIHANCE GADOBENATE DIMEGLUMINE 529 MG/ML IV SOLN COMPARISON:  04/04/2016 head CT.  05/17/2012 brain MRI FINDINGS: Brain: No acute infarction, hemorrhage, hydrocephalus, extra-axial collection or mass lesion. Mild periventricular chronic microvascular ischemic type change. Mild loss of cerebral volume since 2013, but still normal range. Motion degraded thin section coronal T2 weighted imaging of the hippocampus. A seizure focus is not identified. No abnormal intracranial enhancement. Vascular: Major flow voids are preserved. Prominent arachnoid granulation at the vertex in the superior sagittal sinus. Skull and upper cervical spine: Negative for marrow lesion. Sinuses/Orbits: Left mastoidectomy. Bilateral cataract resection. No acute finding. Other: Intermittently motion degraded, best obtainable in this altered patient. IMPRESSION: 1. No acute or reversible finding.  Unremarkable brain MRI for age. 2. Intermittently motion degraded. Electronically Signed   By: JMonte FantasiaM.D.   On: 05/03/2017 17:47   Dg Chest Port 1 View  Result Date: 05/03/2017 CLINICAL DATA:  Patient poor historian at this time. Per patient's RN, patient O2 SAT's in low 80's on 6L of O2. Hx CVA, HTN, hepatitis, heart murmur, DM, COPD, cirrhosis, asthma. Current smoker.  EXAM: PORTABLE CHEST 1 VIEW COMPARISON:  05/01/2017 FINDINGS: Heart size is accentuated by the portable AP technique. There is opacity at the left lung base, increased compared to prior study. Mild pulmonary vascular congestion has increased compared to previous exam. Left IJ central line has been removed. IMPRESSION: Increased pulmonary vascular congestion. Increased left lower lobe infiltrate/consolidation. Electronically Signed  By: Nolon Nations M.D.   On: 05/03/2017 09:15   Dg Chest Port 1 View  Result Date: 05/01/2017 CLINICAL DATA:  Respiratory failure. EXAM: PORTABLE CHEST 1 VIEW COMPARISON:  04/30/2017. FINDINGS: Interim removal of endotracheal tube and NG tube. Left IJ line in stable position. Basilar atelectasis. Persistent left lower lobe infiltrate and left-sided pleural effusion. Slight improvement. IMPRESSION: 1. Interim removal of endotracheal tube and NG tube. Left IJ line stable position. 2. Slight improvement of left base infiltrate and left-sided pleural effusion. Bibasilar atelectasis. Electronically Signed   By: Marcello Moores  Register   On: 05/01/2017 06:33   Dg Chest Port 1 View  Result Date: 04/30/2017 CLINICAL DATA:  Respiratory failure. EXAM: PORTABLE CHEST 1 VIEW COMPARISON:  04/29/2017 . FINDINGS: Endotracheal tube, NG tube, left IJ line in stable position. Left lower lobe infiltrate with left-sided pleural effusion. No significant change from prior exam. Slight improvement of left base atelectasis. IMPRESSION: 1.  Lines and tubes in stable position. 2. Left lower lobe infiltrate with left-sided pleural effusion . No significant change from prior exam. Slight improvement of left base atelectasis . Electronically Signed   By: Marcello Moores  Register   On: 04/30/2017 07:41   Dg Chest Port 1 View  Result Date: 04/29/2017 CLINICAL DATA:  Sepsis. EXAM: PORTABLE CHEST 1 VIEW COMPARISON:  Chest x-ray from same day at 11:33. FINDINGS: Interval placement of an endotracheal tube with the tip  in good position approximately 4.2 cm above the level of the carina. Interval placement of an enteric tube with the tip just inside the stomach, and the distal side port in the distal esophagus. Borderline cardiomegaly, unchanged. Normal pulmonary vascularity. Unchanged left lower lobe consolidation with adjacent small left pleural effusion. The right lung remains clear. No acute osseous abnormality. IMPRESSION: 1. Interval placement of an enteric tube with the distal side port in the distal esophagus. Recommend advancement 8-10 cm. 2. Appropriate positioning of the endotracheal tube. 3. Unchanged left lower lobe pneumonia. Electronically Signed   By: Titus Dubin M.D.   On: 04/29/2017 16:09   Dg Chest Portable 1 View  Result Date: 04/29/2017 CLINICAL DATA:  Cough EXAM: PORTABLE CHEST 1 VIEW COMPARISON:  Chest radiograph March 29, 2017 and chest CT April 05, 2017 FINDINGS: There is less consolidation in the left lower lobe compared to recent studies. There is a minimal left pleural effusion. Right lung is clear. Heart is upper normal in size with pulmonary vascularity within normal limits. There is aortic atherosclerosis. No adenopathy. No focal bone lesions evident. IMPRESSION: There remains consolidation in the left lower lobe but less than on recent studies consistent with partial clearing of pneumonia from the left lower lobe. There is a minimal left pleural effusion. Lungs elsewhere are clear. Cardiac silhouette is stable. There is aortic atherosclerosis. Aortic Atherosclerosis (ICD10-I70.0). Electronically Signed   By: Lowella Grip III M.D.   On: 04/29/2017 12:00   Dg Abd Portable 1v  Result Date: 04/29/2017 CLINICAL DATA:  Nasogastric tube placement EXAM: PORTABLE ABDOMEN - 1 VIEW COMPARISON:  Portable exam 1636 hours compared to 04/29/2017 at 1532 hours FINDINGS: Nasogastric tube is been advanced into the stomach with tip projecting over the mid stomach. Bowel gas pattern normal. No  bowel dilatation or bowel wall thickening. Persistent LEFT lower lobe consolidation. Bones demineralized. Surgical clips RIGHT upper quadrant consistent with history of cholecystectomy. IMPRESSION: Tip of nasogastric tube now projects over mid stomach. Nonobstructive bowel gas pattern. LEFT lower lobe consolidation consistent with pneumonia. Electronically Signed  By: Lavonia Dana M.D.   On: 04/29/2017 16:55   Dg Abd Portable 1v  Result Date: 04/29/2017 CLINICAL DATA:  Feeding tube placement.  Smoker. EXAM: PORTABLE ABDOMEN - 1 VIEW COMPARISON:  Abdomen and pelvis CT dated 02/06/2017. FINDINGS: Normal bowel gas pattern. Cholecystectomy clips. Nasogastric tube tip in the proximal stomach and side hole at the gastroesophageal junction. No feeding tube seen. Mild L5-S1 degenerative changes. IMPRESSION: 1. Nasogastric tube tip in the proximal stomach and side hole at the gastroesophageal junction. 2. No feeding tube seen. Electronically Signed   By: Claudie Revering M.D.   On: 04/29/2017 16:06    EKG: Normal sinus rhythm  Weights: Filed Weights   04/30/17 0500 05/01/17 0354 05/04/17 0757  Weight: 74.9 kg (165 lb 2 oz) 81.4 kg (179 lb 7.3 oz) 77.4 kg (170 lb 9.6 oz)     Physical Exam: Blood pressure (!) 143/67, pulse 76, temperature 98.7 F (37.1 C), temperature source Oral, resp. rate 16, height _0  (1.575 m), weight 77.4 kg (170 lb 9.6 oz), SpO2 96 %. Body mass index is 31.2 kg/m. General: Well developed, well nourished, in no acute distress. Head eyes ears nose throat: Normocephalic, atraumatic, sclera non-icteric, no xanthomas, nares are without discharge. No apparent thyromegaly and/or mass  Lungs: Normal respiratory effort.  no wheezes, no rales, no rhonchi.  Heart: RRR with normal S1 S2. no murmur gallop, no rub, PMI is normal size and placement, carotid upstroke normal without bruit, jugular venous pressure is normal Abdomen: Soft, non-tender, non-distended with normoactive bowel sounds. No  hepatomegaly. No rebound/guarding. No obvious abdominal masses. Abdominal aorta is normal size without bruit Extremities: No edema. no cyanosis, no clubbing, no ulcers  Peripheral : 2+ bilateral upper extremity pulses, 2+ bilateral femoral pulses, 2+ bilateral dorsal pedal pulse Neuro: Alert and oriented. No facial asymmetry. No focal deficit. Moves all extremities spontaneously. Musculoskeletal: Normal muscle tone without kyphosis Psych:  Responds to questions appropriately with a normal affect.    Assessment: 66 year old female with essential hypertension mixed hyperlipidemia diabetes with complications previous stroke sleep apnea COPD with recent pneumonia cough congestion and an supraventricular tachycardia most consistent with the current illness and rehabilitation rather than malignant tachycardia  Plan: 1. Continue diltiazem for heart rate and blood pressure control 2. Continue deep venous thrombosis prophylaxis antiplatelet medication management for previous stroke 3. Continue high intensity cholesterol therapy 4. No additional treatment for isolated episode of supraventricular tachycardia unless recurrence with rehabilitation needing further adjustments of diltiazem  Signed, Corey Skains M.D. [facility] 05/04/2017, 1:35 PM

## 2017-05-04 NOTE — Progress Notes (Signed)
CCMD called to notify pt had a run of SVT and got in the got 150's. MD Manuella Ghazi paged and notified. No new orders.

## 2017-05-05 DIAGNOSIS — Z7189 Other specified counseling: Secondary | ICD-10-CM

## 2017-05-05 LAB — BASIC METABOLIC PANEL
Anion gap: 8 (ref 5–15)
BUN: 18 mg/dL (ref 6–20)
CALCIUM: 8.6 mg/dL — AB (ref 8.9–10.3)
CO2: 32 mmol/L (ref 22–32)
CREATININE: 0.71 mg/dL (ref 0.44–1.00)
Chloride: 110 mmol/L (ref 101–111)
GFR calc Af Amer: >60 mL/min (ref >60)
GLUCOSE: 204 mg/dL — AB (ref 65–99)
Potassium: 2.6 mmol/L — CL (ref 3.5–5.1)
Sodium: 150 mmol/L — ABNORMAL HIGH (ref 135–145)

## 2017-05-05 LAB — CBC
HCT: 40.5 % (ref 35.0–47.0)
Hemoglobin: 13.4 g/dL (ref 12.0–16.0)
MCH: 28.1 pg (ref 26.0–34.0)
MCHC: 33.1 g/dL (ref 32.0–36.0)
MCV: 85 fL (ref 80.0–100.0)
PLATELETS: 208 10*3/uL (ref 150–440)
RBC: 4.76 MIL/uL (ref 3.80–5.20)
RDW: 14 % (ref 11.5–14.5)
WBC: 6.9 10*3/uL (ref 3.6–11.0)

## 2017-05-05 LAB — GLUCOSE, CAPILLARY
GLUCOSE-CAPILLARY: 151 mg/dL — AB (ref 65–99)
Glucose-Capillary: 226 mg/dL — ABNORMAL HIGH (ref 65–99)
Glucose-Capillary: 124 mg/dL — ABNORMAL HIGH (ref 65–99)
Glucose-Capillary: 131 mg/dL — ABNORMAL HIGH (ref 65–99)
Glucose-Capillary: 166 mg/dL — ABNORMAL HIGH (ref 65–99)

## 2017-05-05 LAB — VALPROIC ACID LEVEL: Valproic Acid Lvl: 49 ug/mL — ABNORMAL LOW (ref 50.0–100.0)

## 2017-05-05 LAB — HEMOGLOBIN A1C
Hgb A1c MFr Bld: 7.4 % — ABNORMAL HIGH (ref 4.8–5.6)
Mean Plasma Glucose: 165.68 mg/dL

## 2017-05-05 LAB — POTASSIUM: Potassium: 2.9 mmol/L — ABNORMAL LOW (ref 3.5–5.1)

## 2017-05-05 LAB — MAGNESIUM: MAGNESIUM: 1.7 mg/dL (ref 1.7–2.4)

## 2017-05-05 MED ORDER — POTASSIUM CHLORIDE 10 MEQ/100ML IV SOLN
10.0000 meq | INTRAVENOUS | Status: AC
  Administered 2017-05-05 (×4): 10 meq via INTRAVENOUS
  Filled 2017-05-05 (×3): qty 100

## 2017-05-05 MED ORDER — POTASSIUM CHLORIDE 20 MEQ/15ML (10%) PO SOLN
40.0000 meq | ORAL | Status: AC
  Administered 2017-05-05 (×2): 40 meq via ORAL
  Filled 2017-05-05 (×2): qty 30

## 2017-05-05 MED ORDER — INSULIN DETEMIR 100 UNIT/ML ~~LOC~~ SOLN
8.0000 [IU] | Freq: Every day | SUBCUTANEOUS | Status: DC
  Administered 2017-05-05 – 2017-05-07 (×3): 8 [IU] via SUBCUTANEOUS
  Filled 2017-05-05 (×3): qty 0.08

## 2017-05-05 MED ORDER — KCL IN DEXTROSE-NACL 40-5-0.45 MEQ/L-%-% IV SOLN
INTRAVENOUS | Status: DC
  Administered 2017-05-05 – 2017-05-07 (×4): via INTRAVENOUS
  Filled 2017-05-05 (×6): qty 1000

## 2017-05-05 MED ORDER — MAGNESIUM SULFATE 2 GM/50ML IV SOLN
2.0000 g | Freq: Once | INTRAVENOUS | Status: AC
  Administered 2017-05-05: 2 g via INTRAVENOUS
  Filled 2017-05-05: qty 50

## 2017-05-05 MED ORDER — POTASSIUM CHLORIDE CRYS ER 10 MEQ PO TBCR: 20.0000 meq | EXTENDED_RELEASE_TABLET | ORAL | Status: DC

## 2017-05-05 NOTE — Discharge Planning (Signed)
Sitter discontinued at 1615.

## 2017-05-05 NOTE — Progress Notes (Signed)
Spoke with Dr. Jannifer Franklin pt refusing to take siezure medication. Md wanted this nurse to talk with pt again to see if she will agree to take medicication so she will not have seizure. If pt not agreeable document that pt refused medication.

## 2017-05-05 NOTE — Progress Notes (Addendum)
Per [facility] authorization has been received. Clinical Education officer, museum (CSW) attempted to contact patient's son Louvenia Golomb however he did not answer and a voicemail was left. CSW also contacted patient's daughter Penny Pia and made her aware of above. Patient is aware of above and in agreement with D/C plan. Patient needs to be without a sitter for 24 hours prior to D/C to [facility]. RN aware of above. CSW will continue to follow and assist as needed.   [employer], LCSW [phone]

## 2017-05-05 NOTE — Progress Notes (Signed)
* [facility]     Assessment and Plan:  Acute hypoxic respiratory failure with pneumonia and atelectasis.  Dysphagia at risk for aspiration.  -Doing better today, currently oxygen saturation is 98% on 2 L. - Continue antibiotics. -Continue to advance activity as tolerated - Patient appears stable from respiratory standpoint for discharge soon, pulmonary service will sign off.     Date: 05/05/2017  MRN# [id number] [name] 1951/02/13   [name] is a 66 y.o. old female seen in follow up for chief complaint of  Chief Complaint  Patient presents with  . Respiratory Distress    Hx Seizures, one on EMS      HPI:   66 yo female with a PMH of Stroke, OSA, Seizures, Orthopnea, COPD-home O2 qhs, Neuropathy, Lymph Node Disorder, HTN, Hepatitis, GERD, Heart Murmur, Diabetes Mellitus, Depression, Congenital Deafness (she reads lips), Non-Alcoholic Cirrhosis, and Asthma. S/p extubation 04/30/17 with continued hypoxic  respiratory failure for pneumonia, currently on 4L [place], maxipime.   Patient is awake and alert, she nods yes and no to questions and malls her responses to questions..  ABG while on ventilator 04/29/17; 7.3/50 6/290/27.6 consistent with uncompensated hypercapnic respiratory failure.  Microbiology results: 10/24 BCx: No growth 4 days 10/24 Sputum: Normal flora 10/24 MRSA PCR: negative   Cefepime 10/29>>  Medication:    Current Facility-Administered Medications:  .  albuterol (PROVENTIL) (2.5 MG/3ML) 0.083% nebulizer solution 2.5 mg, 2.5 mg, Nebulization, Q4H PRN, Lenis Noon, RPH .  ceFEPIme (MAXIPIME) 2 g in dextrose 5 % 50 mL IVPB, 2 g, Intravenous, Q12H, Max Sane, MD, Last Rate: 100 mL/hr at 05/05/17 0845, 2 g at 05/05/17 0845 .  citalopram (CELEXA) tablet 20 mg, 20 mg, Oral, Daily, Manuella Ghazi, Vipul, MD .  clopidogrel (PLAVIX) tablet 75 mg, 75 mg, Oral, Daily, Simonds, David B, MD .  dextrose 5 % and 0.45 % NaCl with KCl 40 mEq/L  infusion, , Intravenous, Continuous, Pyreddy, Pavan, MD, Last Rate: 75 mL/hr at 05/05/17 0840 .  dextrose 5 % solution, , Intravenous, Continuous, Max Sane, MD, Last Rate: 75 mL/hr at 05/04/17 1703 .  diltiazem (CARDIZEM CD) 24 hr capsule 180 mg, 180 mg, Oral, Daily, Manuella Ghazi, Vipul, MD .  enoxaparin (LOVENOX) injection 40 mg, 40 mg, Subcutaneous, Q24H, Kasa, Kurian, MD, 40 mg at 05/04/17 2101 .  furosemide (LASIX) injection 40 mg, 40 mg, Intravenous, BID, Max Sane, MD, 40 mg at 05/05/17 0837 .  gabapentin (NEURONTIN) capsule 300 mg, 300 mg, Oral, BID, Manuella Ghazi, Vipul, MD .  hydrALAZINE (APRESOLINE) injection 10 mg, 10 mg, Intravenous, Q4H PRN, Patria Mane, Magadalene S, NP, 10 mg at 05/01/17 2237 .  insulin aspart (novoLOG) injection 0-5 Units, 0-5 Units, Subcutaneous, QHS, Wieting, Richard, MD .  insulin aspart (novoLOG) injection 0-9 Units, 0-9 Units, Subcutaneous, TID WC, Loletha Grayer, MD, 3 Units at 05/04/17 1723 .  insulin detemir (LEVEMIR) injection 8 Units, 8 Units, Subcutaneous, Daily, Max Sane, MD, 8 Units at 05/05/17 0909 .  ipratropium-albuterol (DUONEB) 0.5-2.5 (3) MG/3ML nebulizer solution 3 mL, 3 mL, Nebulization, Q6H, Manuella Ghazi, Vipul, MD, 3 mL at 05/05/17 0729 .  lacosamide (VIMPAT) 200 mg in sodium chloride 0.9 % 25 mL IVPB, 200 mg, Intravenous, Q12H, Alexis Goodell, MD, Stopped at 05/05/17 0010 .  LORazepam (ATIVAN) injection 1-2 mg, 1-2 mg, Intravenous, Q2H PRN, Max Sane, MD, 2 mg at 04/30/17 0554 .  magnesium sulfate IVPB 2 g 50 mL, 2 g, Intravenous, Once, Max Sane, MD .  MEDLINE mouth rinse,  15 mL, Mouth Rinse, BID, Varughese, Bincy S, NP, 15 mL at 05/03/17 1000 .  mometasone-formoterol (DULERA) 200-5 MCG/ACT inhaler 2 puff, 2 puff, Inhalation, BID, Max Sane, MD, 2 puff at 05/05/17 0836 .  montelukast (SINGULAIR) tablet 10 mg, 10 mg, Oral, QHS, Shah, Vipul, MD .  ondansetron (ZOFRAN) injection 4 mg, 4 mg, Intravenous, Q6H PRN, Patria Mane, Magadalene S, NP, 4 mg at 05/02/17  0328 .  pantoprazole (PROTONIX) EC tablet 40 mg, 40 mg, Oral, Daily, Manuella Ghazi, Vipul, MD .  potassium chloride 10 mEq in 100 mL IVPB, 10 mEq, Intravenous, Q1 Hr x 4, Manuella Ghazi, Vipul, MD, Last Rate: 100 mL/hr at 05/05/17 0842, 10 mEq at 05/05/17 0842 .  rOPINIRole (REQUIP) tablet 0.5 mg, 0.5 mg, Oral, TID, Manuella Ghazi, Vipul, MD .  rosuvastatin (CRESTOR) tablet 10 mg, 10 mg, Oral, Daily, Manuella Ghazi, Vipul, MD .  senna-docusate (Senokot-S) tablet 2 tablet, 2 tablet, Oral, BID, Manuella Ghazi, Vipul, MD .  valproate (DEPACON) 500 mg in dextrose 5 % 50 mL IVPB, 500 mg, Intravenous, Q12H, Max Sane, MD, Last Rate: 55 mL/hr at 05/05/17 0912, 500 mg at 05/05/17 5102   Allergies:  Aspirin; Celebrex [celecoxib]; Ciprofloxacin; Codeine; Fosphenytoin; Levaquin [levofloxacin in d5w]; Levofloxacin; Lovastatin; Penicillins; Pravastatin; and Sulfa antibiotics  Review of Systems: Denied pain in chest, difficulty breathing.  Patient denied remainder of review of systems.  Physical Examination:   VS: BP (!) 128/56 (BP Location: Left Arm)   Pulse 68   Temp 98.4 F (36.9 C) (Oral)   Resp 16   Ht _0  (1.575 m)   Wt 170 lb 9.6 oz (77.4 kg)   SpO2 92%   BMI 31.20 kg/m    General Appearance: No distress  Neuro:without focal findings. HEENT: PERRLA, Pulmonary: normal breath sounds, No wheezing.   CardiovascularNormal S1,S2.  No m/r/g.   Abdomen: Benign, Soft, non-tender. Renal:  No costovertebral tenderness  GU:  Not performed at this time. Endoc: No evident thyromegaly, no signs of acromegaly. Skin:   warm, no rash. Extremities: normal, no cyanosis, clubbing.   LABORATORY PANEL:   CBC  Recent Labs Lab 05/05/17 0518  WBC 6.9  HGB 13.4  HCT 40.5  PLT 208   ------------------------------------------------------------------------------------------------------------------  Chemistries   Recent Labs Lab 04/30/17 0358  05/05/17 0518  NA 144  < > 150*  K 2.9*  < > 2.6*  CL 106  < > 110  CO2 22  < > 32  GLUCOSE  328*  < > 204*  BUN 23*  < > 18  CREATININE 1.72*  < > 0.71  CALCIUM 8.2*  < > 8.6*  MG 1.6*  < > 1.7  AST 24  --   --   ALT 9*  --   --   ALKPHOS 88  --   --   BILITOT 0.5  --   --   < > = values in this interval not displayed. ------------------------------------------------------------------------------------------------------------------  Cardiac Enzymes  Recent Labs Lab 04/29/17 1115  TROPONINI 0.04*   ------------------------------------------------------------  RADIOLOGY:   No results found for this or any previous visit. Results for orders placed during the hospital encounter of 03/24/17  DG Chest 2 View   Narrative CLINICAL DATA:  Shortness of breath and cough  EXAM: CHEST  2 VIEW  COMPARISON:  02/11/2017  FINDINGS: Obscuration of the left diaphragm since the previous exam. No pleural effusion. Stable cardiomediastinal silhouette with aortic atherosclerosis. No pneumothorax. Degenerative changes of the spine. Surgical clips in the right upper quadrant  IMPRESSION:  1. Left lower lobe opacity may reflect atelectasis or a pneumonia. Radiographic follow-up to document resolution is recommended.   Electronically Signed   By: Donavan Foil M.D.   On: 03/24/2017 17:57    ------------------------------------------------------------------------------------------------------------------  Thank  you for allowing [facility] to assist in the care of your patient. Our recommendations are noted above.  Please contact us if we can be of further service.   Marda Stalker, MD.  [facility] Office Number: [phone]  Patricia Pesa, M.D.  Merton Border, M.D  05/05/2017

## 2017-05-05 NOTE — Clinical Social Work Placement (Signed)
CLINICAL SOCIAL WORK PLACEMENT  NOTE  Date:  05/05/2017  Patient Details  Name: [name] MRN: [id number] Date of Birth: 06-11-1951  Clinical Social Work is seeking post-discharge placement for this patient at the [place] level of care (*CSW will initial, date and re-position this form in  chart as items are completed):  Yes   Patient/family provided with [facility] Work Department's list of facilities offering this level of care within the geographic area requested by the patient (or if unable, by the patient's family).  Yes   Patient/family informed of their freedom to choose among providers that offer the needed level of care, that participate in Medicare, Medicaid or managed care program needed by the patient, have an available bed and are willing to accept the patient.  Yes   Patient/family informed of [facility]'s ownership interest in [facility] and [facility], as well as of the fact that they are under no obligation to receive care at these facilities.  PASRR submitted to EDS on 05/04/17     PASRR number received on 05/04/17     Existing PASRR number confirmed on       FL2 transmitted to all facilities in geographic area requested by pt/family on 05/04/17     FL2 transmitted to all facilities within larger geographic area on       Patient informed that his/her managed care company has contracts with or will negotiate with certain facilities, including the following:        Yes   Patient/family informed of bed offers received.  Patient chooses bed at  ([facility] )     Physician recommends and patient chooses bed at      Patient to be transferred to   on  .  Patient to be transferred to facility by       Patient family notified on   of transfer.  Name of family member notified:        PHYSICIAN       Additional Comment:    _______________________________________________ Yuliet Needs, Veronia Beets, LCSW 05/05/2017, 9:04  AM

## 2017-05-05 NOTE — Consult Note (Signed)
Consultation Note Date: 05/05/2017   Patient Name: [name]  DOB: [birth year]  MRN: [id number]  Age / Sex: 66 y.o., female  PCP: Cletis Athens, MD Referring Physician: Max Sane, MD  Reason for Consultation: Establishing goals of care  HPI/Patient Profile: [name]  is a 66 y.o. female with a known history of nonalcoholic cirrhosis, COPD, diabetes, hypertension, seizure disorder, deafness who presents for evaluation of shortness of breath and seizure (5-10 en route). According to EMS they were called to the house for shortness of breath. Patient had several days of productive cough, progressively worsening shortness of breath and several daily episodes of watery diarrhea. Patient does have a history of aspiration pneumonia in the setting of seizures. Patient with seizures noted in hospital.   Clinical Assessment and Goals of Care: [name] is resting in bed. She is alert and oriented and reads lips. Son present at bedside. He states she has lived alone and uses oxygen during the day and CPAP at night. He states he was advised by patient's daughter, his sister, she had not been taking her seizure medication as she was supposed to. Patient states she has been taking it correctly, later upon her daughter's visit, she stated she may not have been taking it correctly.  He states that when she begins "getting checks", they will move in a house together; she confirms this. Discussed goals of care. She confirms code status of DNR. She declines to be on a ventilator, but is ok with CPAP/BiPAP. She would not want a feeding tube placed. She is ok with medical care and antibiotics. She states she would not want dialysis.   Some question as to understanding of some of the questions. Sign language interpretor called. Revisited with interpretor. Clarified all the questions above. Also asked about an alternate decision  maker. She states she wants her son to be her decision maker if she becomes unable. Chaplain notified, and interpretor and chaplain into room.    PATIENT is Media planner. MOST form completed.     SUMMARY OF RECOMMENDATIONS   DNR status with additional limited interventions. Does not want to be placed on a ventilator.   Recommend outpatient palliative service to follow.   Code Status/Advance Care Planning:  DNR status confirmed  Psycho-social/Spiritual:   Desire for further Chaplaincy support: Called for POA paperwork per patient request.  Prognosis:   < 12 months  Discharge Planning: To Be Determined      Primary Diagnoses: Present on Admission: . Sepsis ([place])   I have reviewed the medical record, interviewed the patient and family, and examined the patient. The following aspects are pertinent.  Past Medical History:  Diagnosis Date  . Asthma   . Cirrhosis, non-alcoholic ([place])   . COPD (chronic obstructive pulmonary disease) ([place])   . Deaf   . Depression   . Diabetes mellitus without complication ([place])   . GERD (gastroesophageal reflux disease)   . Heart murmur   . Hepatitis   . Hypertension   . Lymph node  disorder    arm  . Neuropathy   . On home oxygen therapy    hs  . Orthopnea   . RLS (restless legs syndrome)   . Seizures ([place])   . Shortness of breath dyspnea   . Sleep apnea   . Stroke [facility])    tia   Social History   Social History  . Marital status: Widowed    Spouse name: N/A  . Number of children: N/A  . Years of education: N/A   Social History Main Topics  . Smoking status: Current Every Day Smoker    Packs/day: 0.50  . Smokeless tobacco: Never Used  . Alcohol use No  . Drug use: No  . Sexual activity: Not Asked   Other Topics Concern  . None   Social History Narrative  . None   Family History  Problem Relation Age of Onset  . Lung cancer Mother   . CAD Father    Scheduled Meds: . citalopram  20 mg Oral Daily  .  clopidogrel  75 mg Oral Daily  . diltiazem  180 mg Oral Daily  . enoxaparin (LOVENOX) injection  40 mg Subcutaneous Q24H  . furosemide  40 mg Intravenous BID  . gabapentin  300 mg Oral BID  . insulin aspart  0-5 Units Subcutaneous QHS  . insulin aspart  0-9 Units Subcutaneous TID WC  . insulin detemir  8 Units Subcutaneous Daily  . ipratropium-albuterol  3 mL Nebulization Q6H  . mouth rinse  15 mL Mouth Rinse BID  . mometasone-formoterol  2 puff Inhalation BID  . montelukast  10 mg Oral QHS  . pantoprazole  40 mg Oral Daily  . rOPINIRole  0.5 mg Oral TID  . rosuvastatin  10 mg Oral Daily  . senna-docusate  2 tablet Oral BID   Continuous Infusions: . ceFEPime (MAXIPIME) IV Stopped (05/05/17 0915)  . dextrose 5 % and 0.45 % NaCl with KCl 40 mEq/L 75 mL/hr at 05/05/17 0840  . dextrose 75 mL/hr at 05/04/17 1703  . lacosamide (VIMPAT) IV 200 mg (05/05/17 1144)  . potassium chloride 10 mEq (05/05/17 1024)  . valproate sodium Stopped (05/05/17 1012)   PRN Meds:.albuterol, hydrALAZINE, LORazepam, ondansetron (ZOFRAN) IV Medications Prior to Admission:  Prior to Admission medications   Medication Sig Start Date End Date Taking? Authorizing Provider  clopidogrel (PLAVIX) 75 MG tablet Take 1 tablet (75 mg total) by mouth daily. 07/17/16  Yes Vaughan Basta, MD  diltiazem (DILACOR XR) 180 MG 24 hr capsule Take 180 mg by mouth daily.   Yes [provider]  furosemide (LASIX) 40 MG tablet Take 1 tablet (40 mg total) by mouth daily. Patient taking differently: Take 20 mg by mouth daily.  07/17/16  Yes Vaughan Basta, MD  gabapentin (NEURONTIN) 300 MG capsule Take 1 capsule by mouth 2 (two) times daily.  01/16/16  Yes [provider]  GLIPIZIDE XL 5 MG 24 hr tablet Take 2 tablets (10 mg total) by mouth daily. Patient taking differently: Take 5 mg by mouth daily.  02/12/17  Yes Max Sane, MD  lacosamide 150 MG TABS Take 1 tablet (150 mg total) by mouth 2 (two) times  daily. 03/30/17  Yes Demetrios Loll, MD  losartan-hydrochlorothiazide [facility]) 100-12.5 MG tablet Take 1 tablet by mouth daily.   Yes [provider]  montelukast (SINGULAIR) 10 MG tablet Take 1 tablet by mouth at bedtime. 03/18/16  Yes [provider]  omeprazole (PRILOSEC) 20 MG capsule Take 20 mg  by mouth daily.   Yes [provider]  rosuvastatin (CRESTOR) 10 MG tablet Take 10 mg by mouth daily. 04/21/17  Yes [provider]  triamcinolone ointment (KENALOG) 0.1 % USE 1 (ONE) APPLICATION TWO TIMES DAILY 04/21/17  Yes [provider]  albuterol (PROVENTIL HFA;VENTOLIN HFA) 108 (90 BASE) MCG/ACT inhaler Inhale 2 puffs into the lungs every 4 (four) hours as needed for wheezing or shortness of breath.    [provider]  albuterol (PROVENTIL) (2.5 MG/3ML) 0.083% nebulizer solution Take 3 mLs (2.5 mg total) by nebulization every 4 (four) hours as needed for wheezing or shortness of breath. Patient not taking: Reported on 04/29/2017 07/17/16   Vaughan Basta, MD  citalopram (CELEXA) 20 MG tablet Take 1 tablet (20 mg total) by mouth daily. Patient not taking: Reported on 04/29/2017 07/17/16   Vaughan Basta, MD  clindamycin (CLEOCIN) 300 MG capsule Take 2 capsules (600 mg total) by mouth every 8 (eight) hours. Patient not taking: Reported on 04/29/2017 03/30/17   Demetrios Loll, MD  EPINEPHrine 0.3 mg/0.3 mL IJ SOAJ injection Inject into the muscle once.    [provider]  guaiFENesin-dextromethorphan (ROBITUSSIN DM) 100-10 MG/5ML syrup Take 5 mLs by mouth every 6 (six) hours as needed for cough. 03/30/17   Demetrios Loll, MD  mometasone-formoterol [facility]) 200-5 MCG/ACT AERO Inhale 2 puffs into the lungs 2 (two) times daily. 07/17/16   Vaughan Basta, MD  rOPINIRole (REQUIP) 0.25 MG tablet Take 1 tablet (0.25 mg total) by mouth 3 (three) times daily. 03/24/16   Oswald Hillock, MD   Allergies  Allergen Reactions  . Aspirin Itching    . Celebrex [Celecoxib] Itching    itching  . Ciprofloxacin Itching  . Codeine Itching  . Fosphenytoin Itching  . Levaquin [Levofloxacin In D5w] Itching  . Levofloxacin Itching  . Lovastatin Itching  . Penicillins     Documentation indicates severe reaction  Pt tolerated cephalosporin without adverse reaction 09/18   . Pravastatin Itching  . Sulfa Antibiotics Itching   Review of Systems  Constitutional:       She has no complaints this morning.     Physical Exam  Constitutional: No distress.  HENT:  Head: Normocephalic.  Eyes: EOM are normal.  Neurological: She is alert.  Oriented  Psychiatric: Thought content normal.    Vital Signs: BP 122/64   Pulse 68   Temp 98.4 F (36.9 C) (Oral)   Resp 16   Ht _0  (1.575 m)   Wt 77.4 kg (170 lb 9.6 oz)   SpO2 92%   BMI 31.20 kg/m  Pain Assessment: No/denies pain       SpO2: SpO2: 92 % O2 Device:SpO2: 92 % O2 Flow Rate: .O2 Flow Rate (L/min): 2 L/min  IO: Intake/output summary:  Intake/Output Summary (Last 24 hours) at 05/05/17 1256 Last data filed at 05/05/17 1111  Gross per 24 hour  Intake           936.25 ml  Output             4200 ml  Net         -3263.75 ml    LBM: Last BM Date: 05/04/17 Baseline Weight: Weight: 74.3 kg (163 lb 12.8 oz) Most recent weight: Weight: 77.4 kg (170 lb 9.6 oz)     Palliative Assessment/Data: 50%     Time In: 12:15 Time Out: 1:15 Time Total: 1 hour Greater than 50%  of this time was spent counseling and coordinating care  related to the above assessment and plan.  Signed by: Asencion Gowda, NP 05/05/2017 1:14 PM Office: [phone] 7am-7pm  Pager: [phone] Call primary team after hours  Please contact Palliative Medicine Team phone at [phone] for questions and concerns.  For individual provider: See Shea Evans

## 2017-05-05 NOTE — Progress Notes (Signed)
Nurse practitioner, Alric Quan, notified chaplain that pt in [phone] was interested in completing Advanced Directive and that the sign language interpretor was available to help. CH met and interpretor met pt who was having lunch at the time of this visit. [place] educated pt about AD assisted by the interpretor. Pt expressed that she wanted her son to be her 19. Pt could not complete the AD because she did not have her son's address and phone number at hand. Pt asked [place] to comeback when her son is present to assist her to complete the AD. [place] agree and will followup as needed.   05/05/17 1300  Clinical Encounter Type  Visited With Patient;Health care provider;Other (Comment)  Visit Type Initial;Other (Comment)  Referral From Nurse;Palliative care team  Consult/Referral To Chaplain  Spiritual Encounters  Spiritual Needs Literature

## 2017-05-05 NOTE — Progress Notes (Signed)
Subjective: Patient improved today.  Able to read lips and follows commands.  Objective: Current vital signs: BP 122/64   Pulse 68   Temp 98.4 F (36.9 C) (Oral)   Resp 16   Ht _0  (1.575 m)   Wt 77.4 kg (170 lb 9.6 oz)   SpO2 92%   BMI 31.20 kg/m  Vital signs in last 24 hours: Temp:  [98.2 F (36.8 C)-98.4 F (36.9 C)] 98.4 F (36.9 C) (10/30 0928) Pulse Rate:  [59-72] 68 (10/30 0928) Resp:  [16-18] 16 (10/30 0928) BP: (122-160)/(56-70) 122/64 (10/30 1148) SpO2:  [92 %-98 %] 92 % (10/30 0928)  Intake/Output from previous day: 10/29 0701 - 10/30 0700 In: 936.3 [I.V.:746.3; IV Piggyback:190] Out: 3700 [Urine:3700] Intake/Output this shift: Total I/O In: -  Out: 1800 [Urine:1800] Nutritional status: DIET DYS 3 Room service appropriate? Yes with Assist; Fluid consistency: Thin  Neurologic Exam: Mental Status: Alert.  Follows simple commands.  Reading lips. Cranial Nerves: II: Discs flat bilaterally; Visual fields grossly normal, pupils equal, round, reactive to light and accommodation III,IV, VI: ptosis not present, extra-ocular motions intact bilaterally V,VII: smile symmetric, facial light touch sensation normal bilaterally VIII: hearing normal bilaterally IX,X: gag reflex present XI: bilateral shoulder shrug XII: midline tongue extension Motor: Right : Upper extremity   5/5    Left:     Upper extremity   5/5  Lower extremity   5/5     Lower extremity   5/5 Tone and bulk:normal tone throughout; no atrophy noted   Lab Results: Basic Metabolic Panel:  Recent Labs Lab 04/29/17 1523 04/30/17 0358 05/01/17 0355 05/02/17 0530 05/04/17 0515 05/05/17 0518  NA  --  144 143 146* 151* 150*  K  --  2.9* 4.2 3.4* 3.6 2.6*  CL  --  106 111 111 112* 110  CO2  --  _1 32  GLUCOSE  --  328* 177* 124* 211* 204*  BUN  --  23* 21* 21* 21* 18  CREATININE  --  1.72* 1.10* 0.69 0.75 0.71  CALCIUM  --  8.2* 8.3* 8.8* 8.9 8.6*  MG 1.9 1.6* 2.2 1.9 1.6* 1.7   PHOS 3.5 3.2  --  2.2*  --   --     Liver Function Tests:  Recent Labs Lab 04/29/17 1115 04/30/17 0358  AST 20 24  ALT 10* 9*  ALKPHOS 94 88  BILITOT 0.6 0.5  PROT 7.0 6.0*  ALBUMIN 3.4* 2.9*   No results for input(s): LIPASE, AMYLASE in the last 168 hours.  Recent Labs Lab 04/29/17 1115 05/04/17 0515  AMMONIA 24 19    CBC:  Recent Labs Lab 04/29/17 1115 04/30/17 0358 05/01/17 0355 05/04/17 0515 05/05/17 0518  WBC 21.8* 28.2* 10.0 10.4 6.9  NEUTROABS 18.5*  --   --   --   --   HGB 15.5 13.7 12.8 13.5 13.4  HCT 45.6 41.1 37.6 40.6 40.5  MCV 81.7 84.0 84.2 84.6 85.0  PLT 316 473* 202 229 208    Cardiac Enzymes:  Recent Labs Lab 04/29/17 1115  TROPONINI 0.04*    Lipid Panel:  Recent Labs Lab 04/29/17 1523  TRIG 133    CBG:  Recent Labs Lab 05/04/17 1148 05/04/17 1658 05/04/17 2053 05/05/17 0753 05/05/17 1200  GLUCAP 197* 203* 200* 66* 151*    Microbiology: Results for orders placed or performed during the hospital encounter of 04/29/17  Blood culture (routine x 2)     Status: None  Collection Time: 04/29/17 12:38 PM  Result Value Ref Range Status   Specimen Description BLOOD RFOA  Final   Special Requests   Final    BOTTLES DRAWN AEROBIC AND ANAEROBIC Blood Culture adequate volume   Culture NO GROWTH 5 DAYS  Final   Report Status 05/04/2017 FINAL  Final  Blood culture (routine x 2)     Status: None   Collection Time: 04/29/17 12:39 PM  Result Value Ref Range Status   Specimen Description BLOOD RIGHT HAND  Final   Special Requests   Final    BOTTLES DRAWN AEROBIC AND ANAEROBIC Blood Culture adequate volume   Culture NO GROWTH 5 DAYS  Final   Report Status 05/04/2017 FINAL  Final  Culture, respiratory (NON-Expectorated)     Status: None   Collection Time: 04/29/17  3:00 PM  Result Value Ref Range Status   Specimen Description TRACHEAL ASPIRATE  Final   Special Requests NONE  Final   Gram Stain   Final    MODERATE WBC  PRESENT, PREDOMINANTLY PMN FEW GRAM POSITIVE COCCI FEW GRAM POSITIVE RODS    Culture   Final    Consistent with normal respiratory flora. Performed at [facility], [place] [address] [place], [place] [address]    Report Status 05/02/2017 FINAL  Final  MRSA PCR Screening     Status: None   Collection Time: 04/29/17  4:00 PM  Result Value Ref Range Status   MRSA by PCR NEGATIVE NEGATIVE Final    Comment:        The GeneXpert MRSA Assay (FDA approved for NASAL specimens only), is one component of a comprehensive MRSA colonization surveillance program. It is not intended to diagnose MRSA infection nor to guide or monitor treatment for MRSA infections.     Coagulation Studies: No results for input(s): LABPROT, INR in the last 72 hours.  Imaging: Mr Jeri Cos ZR Contrast  Result Date: 05/03/2017 CLINICAL DATA:  Noted with seizures. History of seizures with medications. Sent home. Acute encephalopathy box be related to prolonged seizure. EXAM: MRI HEAD WITHOUT AND WITH CONTRAST TECHNIQUE: Multiplanar, multiecho pulse sequences of the brain and surrounding structures were obtained without and with intravenous contrast. CONTRAST:  58m MULTIHANCE GADOBENATE DIMEGLUMINE 529 MG/ML IV SOLN COMPARISON:  04/04/2016 head CT.  05/17/2012 brain MRI FINDINGS: Brain: No acute infarction, hemorrhage, hydrocephalus, extra-axial collection or mass lesion. Mild periventricular chronic microvascular ischemic type change. Mild loss of cerebral volume since 2013, but still normal range. Motion degraded thin section coronal T2 weighted imaging of the hippocampus. A seizure focus is not identified. No abnormal intracranial enhancement. Vascular: Major flow voids are preserved. Prominent arachnoid granulation at the vertex in the superior sagittal sinus. Skull and upper cervical spine: Negative for marrow lesion. Sinuses/Orbits: Left mastoidectomy. Bilateral cataract resection. No acute finding. Other:  Intermittently motion degraded, best obtainable in this altered patient. IMPRESSION: 1. No acute or reversible finding.  Unremarkable brain MRI for age. 2. Intermittently motion degraded. Electronically Signed   By: JMonte FantasiaM.D.   On: 05/03/2017 17:47    Medications:  I have reviewed the patient's current medications. Scheduled: . citalopram  20 mg Oral Daily  . clopidogrel  75 mg Oral Daily  . diltiazem  180 mg Oral Daily  . enoxaparin (LOVENOX) injection  40 mg Subcutaneous Q24H  . furosemide  40 mg Intravenous BID  . gabapentin  300 mg Oral BID  . insulin aspart  0-5 Units Subcutaneous QHS  . insulin  aspart  0-9 Units Subcutaneous TID WC  . insulin detemir  8 Units Subcutaneous Daily  . ipratropium-albuterol  3 mL Nebulization Q6H  . mouth rinse  15 mL Mouth Rinse BID  . mometasone-formoterol  2 puff Inhalation BID  . montelukast  10 mg Oral QHS  . pantoprazole  40 mg Oral Daily  . rOPINIRole  0.5 mg Oral TID  . rosuvastatin  10 mg Oral Daily  . senna-docusate  2 tablet Oral BID    Assessment/Plan: Patient much improved today.  No further seizures noted and much closer to baseline.  Tolerating Depakote.  Depakote level of 49.    Recommendations: 1.  Continue Vimpat at current dose.  May change to po once able to take po at current dosing 2.  Continue Depakote at current dose.  May change to po at current dosing once able to take po 3.  Continue seizure precautions   LOS: 6 days   Alexis Goodell, MD Neurology [phone] 05/05/2017  1:07 PM

## 2017-05-05 NOTE — Plan of Care (Addendum)
Awaiting sign language interpretor.  Asencion Gowda, NP 05/05/2017 10:28 AM Office: [phone] 7am-7pm  Pager: [phone] Call primary team after hours

## 2017-05-05 NOTE — Progress Notes (Signed)
MEDICATION RELATED CONSULT NOTE - INITIAL   Pharmacy Consult for Electrolyte management Indication: hypokalemia  Allergies  Allergen Reactions  . Aspirin Itching  . Celebrex [Celecoxib] Itching    itching  . Ciprofloxacin Itching  . Codeine Itching  . Fosphenytoin Itching  . Levaquin [Levofloxacin In D5w] Itching  . Levofloxacin Itching  . Lovastatin Itching  . Penicillins     Documentation indicates severe reaction  Pt tolerated cephalosporin without adverse reaction 09/18   . Pravastatin Itching  . Sulfa Antibiotics Itching    Patient Measurements: Height: _0  (157.5 cm) Weight: 170 lb 9.6 oz (77.4 kg) IBW/kg (Calculated) : 50.1 Adjusted Body Weight:    Labs:  Recent Labs  05/04/17 0515 05/05/17 0518  WBC 10.4 6.9  HGB 13.5 13.4  HCT 40.6 40.5  PLT 229 208  CREATININE 0.75 0.71  MG 1.6* 1.7   Lab Results  Component Value Date   K 2.9 (L) 05/05/2017   Estimated Creatinine Clearance: 67.5 mL/min (by C-G formula based on SCr of 0.71 mg/dL).  Medical History: Past Medical History:  Diagnosis Date  . Asthma   . Cirrhosis, non-alcoholic ([place])   . COPD (chronic obstructive pulmonary disease) ([place])   . Deaf   . Depression   . Diabetes mellitus without complication ([place])   . GERD (gastroesophageal reflux disease)   . Heart murmur   . Hepatitis   . Hypertension   . Lymph node disorder    arm  . Neuropathy   . On home oxygen therapy    hs  . Orthopnea   . RLS (restless legs syndrome)   . Seizures ([place])   . Shortness of breath dyspnea   . Sleep apnea   . Stroke ([place])    tia    Medications:  Scheduled:  . citalopram  20 mg Oral Daily  . clopidogrel  75 mg Oral Daily  . diltiazem  180 mg Oral Daily  . enoxaparin (LOVENOX) injection  40 mg Subcutaneous Q24H  . furosemide  40 mg Intravenous BID  . gabapentin  300 mg Oral BID  . insulin aspart  0-5 Units Subcutaneous QHS  . insulin aspart  0-9 Units Subcutaneous TID WC  . insulin detemir  8  Units Subcutaneous Daily  . ipratropium-albuterol  3 mL Nebulization Q6H  . mouth rinse  15 mL Mouth Rinse BID  . mometasone-formoterol  2 puff Inhalation BID  . montelukast  10 mg Oral QHS  . pantoprazole  40 mg Oral Daily  . potassium chloride  40 mEq Oral Q4H  . rOPINIRole  0.5 mg Oral TID  . rosuvastatin  10 mg Oral Daily  . senna-docusate  2 tablet Oral BID   Infusions:  . ceFEPime (MAXIPIME) IV Stopped (05/05/17 0915)  . dextrose 5 % and 0.45 % NaCl with KCl 40 mEq/L 75 mL/hr at 05/05/17 0840  . lacosamide (VIMPAT) IV Stopped (05/05/17 1214)  . valproate sodium Stopped (05/05/17 1012)    Assessment: 66 yo F admitted with seizures, now with hypokalemia.  Patient started lasix 40 mg IV q12h yesterday 10/29.  10/30:  K 2.6, Mag 1.7  Na+ 150  Goal of Therapy:  Electrolytes WNL  Plan:  Patient was started on D51/2NS w/ 40 meq KCL at 75 ml/hr this am 10/30 by MD.  Will add IV KCL 40 meq x1 (10 meq x 4 bags) as patient is NPO d/t swallowing issues. Will give magnesium sulfate 2 gram IV x 1.  Will recheck K at  1800 and electrolytes with am labs.  10/30 1825 K 2.9. IVF with KCl 40 mEq/L still running at 75 mL/hr. Will give potassium chloride 10% oral solution 40 mEq po Q4H x 2 doses and recheck BMP and magnesium with AM labs.  Verena Shawgo A. Jordan Hawks, PharmD Clinical Pharmacist 05/05/2017

## 2017-05-05 NOTE — Plan of Care (Signed)
Daughter was in room with patient and noticed patient stopped talking and her eyes began to flutter.  Patient's head laid back in bed and eyes closed.  After attempting to awaken patient, with no success - Daughter came to nurses station to inform staff. I (RN) went in room and found patient unresponsive to voice, name, or sternal rub. Patient had a strong pulse and was breathing appropriately. Patient was diaphoretic and cool to the touch. Vitals and BS taken and recorded as stable.   After about 1 minutes patient opened eyes and looked at me, but still would not verbally respond to questions.  After another 30 secs, patient had resumed her normal baseline and asked for a coke.

## 2017-05-05 NOTE — Progress Notes (Signed)
Clinical Social Worker (CSW) contacted patient's son Avari Nevares and presented bed offers. Son chose [facility]. Per Alger Simons liaison he will start [facility] authorization. CSW will continue to follow and assist as needed.   [employer], LCSW [phone]

## 2017-05-05 NOTE — Progress Notes (Signed)
Spoke with pt about the importance of taking her siezure medication. Pt is agreeable to take it. Medication restarted.

## 2017-05-05 NOTE — Progress Notes (Signed)
Spoke with Dr. Estanislado Pandy potassium of 2.6. MD to place orders.

## 2017-05-05 NOTE — Progress Notes (Signed)
Patient ID: [name], female   DOB: [birth year], 66 y.o.   MRN: [id number]  Karen PROGRESS NOTE  [name] GBM:[id number] DOB: 1951-06-04 DOA: 04/29/2017 PCP: Cletis Athens, MD  HPI/Subjective: Much more awake and following commands, son at bedside and agrees with improvement  Objective: Vitals:   05/05/17 0928 05/05/17 1148  BP: (!) 128/56 122/64  Pulse: 68   Resp: 16   Temp: 98.4 F (36.9 C)   SpO2: 92%     Filed Weights   04/30/17 0500 05/01/17 0354 05/04/17 0757  Weight: 74.9 kg (165 lb 2 oz) 81.4 kg (179 lb 7.3 oz) 77.4 kg (170 lb 9.6 oz)    ROS: Review of Systems  Constitutional: Negative for chills, fever and weight loss.  HENT: Negative for nosebleeds and sore throat.   Eyes: Negative for blurred vision.  Respiratory: Negative for cough, shortness of breath and wheezing.   Cardiovascular: Negative for chest pain, orthopnea, leg swelling and PND.  Gastrointestinal: Negative for abdominal pain, constipation, diarrhea, heartburn, nausea and vomiting.  Genitourinary: Negative for dysuria and urgency.  Musculoskeletal: Negative for back pain.  Skin: Negative for rash.  Neurological: Negative for dizziness, speech change, focal weakness and headaches.  Endo/Heme/Allergies: Does not bruise/bleed easily.  Psychiatric/Behavioral: Negative for depression.   Exam: Physical Exam  HENT:  Nose: No mucosal edema.  Throat dry  Eyes: Pupils are equal, round, and reactive to light. Conjunctivae and lids are normal.  Neck: Carotid bruit is not present. No thyromegaly present.  Cardiovascular: Regular rhythm, S1 normal, S2 normal and normal heart sounds.   Respiratory: She has decreased breath sounds in the right lower field and the left lower field. She has no wheezes. She has rhonchi in the right middle field and the left middle field. She has rales in the right lower field and the left lower field.  GI: Soft. Bowel sounds are normal. There is no  tenderness.  Musculoskeletal:       Right ankle: She exhibits no swelling.       Left ankle: She exhibits no swelling.  Neurological: She is alert.  Moves her extremities on her own.  Was able to sit up for me  Skin: Skin is warm. No rash noted.  Psychiatric:  Alert and tracks me across the room. Follow some sign language.        Data Reviewed: Basic Metabolic Panel:  Recent Labs Lab 04/29/17 1523 04/30/17 0358 05/01/17 0355 05/02/17 0530 05/04/17 0515 05/05/17 0518  NA  --  144 143 146* 151* 150*  K  --  2.9* 4.2 3.4* 3.6 2.6*  CL  --  106 111 111 112* 110  CO2  --  _0 32  GLUCOSE  --  328* 177* 124* 211* 204*  BUN  --  23* 21* 21* 21* 18  CREATININE  --  1.72* 1.10* 0.69 0.75 0.71  CALCIUM  --  8.2* 8.3* 8.8* 8.9 8.6*  MG 1.9 1.6* 2.2 1.9 1.6* 1.7  PHOS 3.5 3.2  --  2.2*  --   --    Liver Function Tests:  Recent Labs Lab 04/29/17 1115 04/30/17 0358  AST 20 24  ALT 10* 9*  ALKPHOS 94 88  BILITOT 0.6 0.5  PROT 7.0 6.0*  ALBUMIN 3.4* 2.9*    Recent Labs Lab 04/29/17 1115 05/04/17 0515  AMMONIA 24 19   CBC:  Recent Labs Lab 04/29/17 1115 04/30/17 0358 05/01/17 0355 05/04/17 0515 05/05/17 0518  WBC  21.8* 28.2* 10.0 10.4 6.9  NEUTROABS 18.5*  --   --   --   --   HGB 15.5 13.7 12.8 13.5 13.4  HCT 45.6 41.1 37.6 40.6 40.5  MCV 81.7 84.0 84.2 84.6 85.0  PLT 316 473* 202 229 208   Cardiac Enzymes:  Recent Labs Lab 04/29/17 1115  TROPONINI 0.04*   BNP (last 3 results)  Recent Labs  02/06/17 1109 03/24/17 1738  BNP 50.0 184.0*     CBG:  Recent Labs Lab 05/04/17 1148 05/04/17 1658 05/04/17 2053 05/05/17 0753 05/05/17 1200  GLUCAP 197* 203* 200* 226* 151*       Studies: Mr Jeri Cos Wo Contrast  Result Date: 05/03/2017 CLINICAL DATA:  Noted with seizures. History of seizures with medications. Sent home. Acute encephalopathy box be related to prolonged seizure. EXAM: MRI HEAD WITHOUT AND WITH CONTRAST TECHNIQUE:  Multiplanar, multiecho pulse sequences of the brain and surrounding structures were obtained without and with intravenous contrast. CONTRAST:  19m MULTIHANCE GADOBENATE DIMEGLUMINE 529 MG/ML IV SOLN COMPARISON:  04/04/2016 head CT.  05/17/2012 brain MRI FINDINGS: Brain: No acute infarction, hemorrhage, hydrocephalus, extra-axial collection or mass lesion. Mild periventricular chronic microvascular ischemic type change. Mild loss of cerebral volume since 2013, but still normal range. Motion degraded thin section coronal T2 weighted imaging of the hippocampus. A seizure focus is not identified. No abnormal intracranial enhancement. Vascular: Major flow voids are preserved. Prominent arachnoid granulation at the vertex in the superior sagittal sinus. Skull and upper cervical spine: Negative for marrow lesion. Sinuses/Orbits: Left mastoidectomy. Bilateral cataract resection. No acute finding. Other: Intermittently motion degraded, best obtainable in this altered patient. IMPRESSION: 1. No acute or reversible finding.  Unremarkable brain MRI for age. 2. Intermittently motion degraded. Electronically Signed   By: JMonte FantasiaM.D.   On: 05/03/2017 17:47    Scheduled Meds: . citalopram  20 mg Oral Daily  . clopidogrel  75 mg Oral Daily  . diltiazem  180 mg Oral Daily  . enoxaparin (LOVENOX) injection  40 mg Subcutaneous Q24H  . furosemide  40 mg Intravenous BID  . gabapentin  300 mg Oral BID  . insulin aspart  0-5 Units Subcutaneous QHS  . insulin aspart  0-9 Units Subcutaneous TID WC  . insulin detemir  8 Units Subcutaneous Daily  . ipratropium-albuterol  3 mL Nebulization Q6H  . mouth rinse  15 mL Mouth Rinse BID  . mometasone-formoterol  2 puff Inhalation BID  . montelukast  10 mg Oral QHS  . pantoprazole  40 mg Oral Daily  . rOPINIRole  0.5 mg Oral TID  . rosuvastatin  10 mg Oral Daily  . senna-docusate  2 tablet Oral BID   Continuous Infusions: . ceFEPime (MAXIPIME) IV Stopped (05/05/17  0915)  . dextrose 5 % and 0.45 % NaCl with KCl 40 mEq/L 75 mL/hr at 05/05/17 0840  . lacosamide (VIMPAT) IV 200 mg (05/05/17 1144)  . potassium chloride 10 mEq (05/05/17 1625)  . valproate sodium Stopped (05/05/17 1012)    Assessment/Plan:  1. Acute on chronic respiratory failure with hypoxia on oxygen.  Status post extubation 04/30/2017. Patient is on 2 L nasal cannula. Continue Maxipime for pneumonia. 2. Acute metabolic encephalopathy likely from prolonged seizure.  EEG showing triphasic waves which is likely metabolic in nature. MRI of the brain with and without contrast shows no acute pathology.  Now eating dysphagia diet per speech therapy 3. Seizure disorder: Continue Vimpat at current dose.  May change to po once  able to take po at current dosing, Continue Depakote at current dose.  May change to po at current dosing once able to take po.  Appreciate neurology input 4. Hypernatremia: Likely iatrogenic, slowly improving, sodium of 150 today 5. Type 2 diabetes mellitus on sliding scale, resume her oral medication once eating properly 6. History of cirrhosis: Ammonia level of 19 7. Lactic acidosis improved with IV fluids. 8. Acute kidney injury.  Improved with IV fluids. 9. Hypokalemia: Replete and recheck, electrolyte protocol per pharmacy  Code Status: DNR  Family Communication: Spoke with son at the bedside. Disposition Plan: Skilled nursing facility for tomorrow.  Discussed with son who is in agreement with placement will consult social worker  Consultants:  Neurology  Antibiotics:  Maxipime  Time spent: 30 minutes.   Kynley Metzger [employer]

## 2017-05-05 NOTE — Care Management (Signed)
Plan for discharge to SNF. CSW facilitating.  Corene Cornea with [facility] notified of discharge plan. RNCM signing off.

## 2017-05-05 NOTE — Progress Notes (Signed)
Speech Language Pathology Treatment: Dysphagia  Patient Details Name: [name] MRN: [id number] DOB: 25-Oct-1950 Today's Date: 05/05/2017 Time: 4270-6237 SLP Time Calculation (min) (ACUTE ONLY): 45 min  Assessment / Plan / Recommendation Clinical Impression  Pt was seen for ongoing assessment of oropharyngeal swallow function and diet toleration w/ trials to upgrade diet as appropriate. Today, pt appears more alert and oriented than yesterday; engaged and using gestures to communicate w/ ST. Pt was eager to eat/drink and communicate w/ ST.  Pt was given trials of thin liquid, puree, and soft solids. Pt fed self given the setup. During trials of thin liquid via cup, pt's oral phase appeared grossly WFL. Pt independently exhibited small sips and a slow pace while drinking. No overt, immediate s/s of aspiration were noted. No coughing or throat clearing were noted. During trials of puree and soft solids, pt exhibited timely A-P transit and swallow w/ adequate oral clearing. No overt, immediate s/s of aspiration were noted. No decline in respiratory status was noted. Pt and Son were educated on aspiration precautions - sitting upright during meals, slow pace, small sips/bites, and reducing environmental distractions. Pt was able to express wants/needs today moreso than yesterday, indicating she wanted chocolate ice cream instead of vanilla. Pt is still easily distracted and needs min visual cues to reattend to task. Pt does read lips adequately. D/t overall presentation, Recommend age-appropriate regular diet (softer foods/moistened d/t top and bottom dentures - easier mastication) w/ thin liquids; general aspiration precautions - sitting upright, reducing distractions, small bites/sips, slow pace, reduce distractions around pt. Recommend reflux precautions d/t med hx of GERD. Recommend meds whole in applesauce.  ST services will f/u w/ pt's status, diet toleration, and education while admitted.  NSG/MD updated.     HPI HPI: Pt is a 66 yo female with a PMH of Stroke, OSA, Seizures, Orthopnea, COPD-home O2 qhs, Neuropathy, Lymph Node Disorder, HTN, Hepatitis, GERD, Heart Murmur, Diabetes Mellitus, Depression, Congenital Deafness (she reads lips), Non-Alcoholic Cirrhosis, and Asthma.  She presented to [facility] ER 10/24 via EMS with respiratory distress and seizures.  Per ER notes the pt had a sizure at home at 11:10 am on 10/24, and then had a second seizure en route to the ER that lasted 5-10 seconds witnessed by EMS. Upon arrival to the ER she had two additional seizures and received a total of 3 mg ativan and was loaded with her home dose of Keppra and Neurology consulted.  Per ER notes the pt has not been taking her antiepileptics for "a while" because they made her sick.  In the ER lab results concerning for possible sepsis and chest xray concerning for possible health acquired pneumonia, therefore sepsis protocol initiated.  She was subsequently admitted to ICU by hospitalist team for further workup and treatment PCCM consulted.  Upon arrival to ICU she required mechanical intubation for airway protection due to multiple seizure activity.        SLP Plan  Goals updated;Continue with current plan of care       Recommendations  Diet recommendations: Regular;Thin liquid (softer foods d/t dentures) Liquids provided via: Cup Medication Administration: Whole meds with puree Supervision: Intermittent supervision to cue for compensatory strategies Compensations: Minimize environmental distractions;Slow rate;Small sips/bites;Lingual sweep for clearance of pocketing;Follow solids with liquid Postural Changes and/or Swallow Maneuvers: Seated upright 90 degrees;Upright 30-60 min after meal                General recommendations:  (dietician f/u) Oral Care Recommendations: Oral  care BID;Patient independent with oral care Follow up Recommendations: None SLP Visit Diagnosis: Dysphagia,  oropharyngeal phase (R13.12) Plan: Goals updated;Continue with current plan of care       GO               Carolynn Sayers, SLP-Graduate Student Carolynn Sayers 05/05/2017, 3:18 PM   This information has been reviewed and agreed upon by this supervising clinician.  This patient note, response to treatment and overall treatment plan has been reviewed and this clinician agrees with the information provided.  05/05/17, 3:52 PM [place], [place], CCC-SLP

## 2017-05-05 NOTE — Progress Notes (Signed)
MEDICATION RELATED CONSULT NOTE - INITIAL   Pharmacy Consult for Electrolyte management Indication: hypokalemia  Allergies  Allergen Reactions  . Aspirin Itching  . Celebrex [Celecoxib] Itching    itching  . Ciprofloxacin Itching  . Codeine Itching  . Fosphenytoin Itching  . Levaquin [Levofloxacin In D5w] Itching  . Levofloxacin Itching  . Lovastatin Itching  . Penicillins     Documentation indicates severe reaction  Pt tolerated cephalosporin without adverse reaction 09/18   . Pravastatin Itching  . Sulfa Antibiotics Itching    Patient Measurements: Height: _0  (157.5 cm) Weight: 170 lb 9.6 oz (77.4 kg) IBW/kg (Calculated) : 50.1 Adjusted Body Weight:    Labs:  Recent Labs  05/04/17 0515 05/05/17 0518  WBC 10.4 6.9  HGB 13.5 13.4  HCT 40.6 40.5  PLT 229 208  CREATININE 0.75 0.71  MG 1.6* 1.7   Lab Results  Component Value Date   K 2.6 (LL) 05/05/2017   Estimated Creatinine Clearance: 67.5 mL/min (by C-G formula based on SCr of 0.71 mg/dL).  Medical History: Past Medical History:  Diagnosis Date  . Asthma   . Cirrhosis, non-alcoholic ([place])   . COPD (chronic obstructive pulmonary disease) ([place])   . Deaf   . Depression   . Diabetes mellitus without complication ([place])   . GERD (gastroesophageal reflux disease)   . Heart murmur   . Hepatitis   . Hypertension   . Lymph node disorder    arm  . Neuropathy   . On home oxygen therapy    hs  . Orthopnea   . RLS (restless legs syndrome)   . Seizures ([place])   . Shortness of breath dyspnea   . Sleep apnea   . Stroke ([place])    tia    Medications:  Scheduled:  . citalopram  20 mg Oral Daily  . clopidogrel  75 mg Oral Daily  . diltiazem  180 mg Oral Daily  . enoxaparin (LOVENOX) injection  40 mg Subcutaneous Q24H  . furosemide  40 mg Intravenous BID  . gabapentin  300 mg Oral BID  . insulin aspart  0-5 Units Subcutaneous QHS  . insulin aspart  0-9 Units Subcutaneous TID WC  . insulin detemir  8  Units Subcutaneous Daily  . ipratropium-albuterol  3 mL Nebulization Q6H  . mouth rinse  15 mL Mouth Rinse BID  . mometasone-formoterol  2 puff Inhalation BID  . montelukast  10 mg Oral QHS  . pantoprazole  40 mg Oral Daily  . rOPINIRole  0.5 mg Oral TID  . rosuvastatin  10 mg Oral Daily  . senna-docusate  2 tablet Oral BID   Infusions:  . ceFEPime (MAXIPIME) IV Stopped (05/04/17 2131)  . dextrose 5 % and 0.45 % NaCl with KCl 40 mEq/L    . dextrose 75 mL/hr at 05/04/17 1703  . lacosamide (VIMPAT) IV Stopped (05/05/17 0010)  . magnesium sulfate 1 - 4 g bolus IVPB    . potassium chloride    . valproate sodium Stopped (05/04/17 2321)    Assessment: 66 yo F admitted with seizures, now with hypokalemia.  Patient started lasix 40 mg IV q12h yesterday 10/29.  10/30:  K 2.6, Mag 1.7  Na+ 150  Goal of Therapy:  Electrolytes WNL  Plan:  Patient was started on D51/2NS w/ 40 meq KCL at 75 ml/hr this am 10/30 by MD.  Will add IV KCL 40 meq x1 (10 meq x 4 bags) as patient is NPO d/t swallowing issues.  Will give magnesium sulfate 2 gram IV x 1.  Will recheck K at 1800 and electrolytes with am labs.  Chinita Greenland PharmD Clinical Pharmacist 05/05/2017

## 2017-05-05 NOTE — Progress Notes (Signed)
Inpatient Diabetes Program Recommendations  AACE/ADA: New Consensus Statement on Inpatient Glycemic Control (2015)  Target Ranges:  Prepandial:   less than 140 mg/dL      Peak postprandial:   less than 180 mg/dL (1-2 hours)      Critically ill patients:  140 - 180 mg/dL   Results for [name], [name] (MRN [id number]) as of 05/05/2017 07:28  Ref. Range 05/05/2017 05:18  Glucose Latest Ref Range: 65 - 99 mg/dL 204 (H)   Results for [name], [name] (MRN [id number]) as of 05/05/2017 07:28  Ref. Range 05/04/2017 08:09 05/04/2017 11:48 05/04/2017 16:58 05/04/2017 20:53  Glucose-Capillary Latest Ref Range: 65 - 99 mg/dL 211 (H) 197 (H) 203 (H) 200 (H)   Review of Glycemic Control  Diabetes history: DM2 Outpatient Diabetes medications: Glipizide XL 5 mg daily Current orders for Inpatient glycemic control: Glipizide XL 5 mg daily, Novolog 0-9 units TID with meals, Novolog 0-5 units QHS  Inpatient Diabetes Program Recommendations:  Insulin - Basal: Please consider ordering Levemir 8 units Q24H. Oral Agents: Please discontinue Glipizide while inpatient and ordered NPO. HgbA1C: Please order an A1C to evaluate glycemic control over the past 2-3 months.  Thanks, Barnie Alderman, RN, MSN, CDE Diabetes Coordinator Inpatient Diabetes Program [phone] (Team Pager from 8am to 5pm)

## 2017-05-06 DIAGNOSIS — Z7189 Other specified counseling: Secondary | ICD-10-CM

## 2017-05-06 LAB — BASIC METABOLIC PANEL
ANION GAP: 7 (ref 5–15)
BUN: 11 mg/dL (ref 6–20)
CO2: 32 mmol/L (ref 22–32)
Calcium: 8 mg/dL — ABNORMAL LOW (ref 8.9–10.3)
Chloride: 105 mmol/L (ref 101–111)
Creatinine, Ser: 0.62 mg/dL (ref 0.44–1.00)
GFR calc Af Amer: >60 mL/min (ref >60)
GLUCOSE: 136 mg/dL — AB (ref 65–99)
POTASSIUM: 3.3 mmol/L — AB (ref 3.5–5.1)
SODIUM: 144 mmol/L (ref 135–145)

## 2017-05-06 LAB — CBC
HCT: 37.8 % (ref 35.0–47.0)
HEMOGLOBIN: 12.6 g/dL (ref 12.0–16.0)
MCH: 28.9 pg (ref 26.0–34.0)
MCHC: 33.5 g/dL (ref 32.0–36.0)
MCV: 86.2 fL (ref 80.0–100.0)
Platelets: 172 10*3/uL (ref 150–440)
RBC: 4.38 MIL/uL (ref 3.80–5.20)
RDW: 13.7 % (ref 11.5–14.5)
WBC: 4.8 10*3/uL (ref 3.6–11.0)

## 2017-05-06 LAB — MAGNESIUM: MAGNESIUM: 1.6 mg/dL — AB (ref 1.7–2.4)

## 2017-05-06 LAB — GLUCOSE, CAPILLARY
GLUCOSE-CAPILLARY: 171 mg/dL — AB (ref 65–99)
GLUCOSE-CAPILLARY: 121 mg/dL — AB (ref 65–99)
Glucose-Capillary: 108 mg/dL — ABNORMAL HIGH (ref 65–99)

## 2017-05-06 MED ORDER — IPRATROPIUM-ALBUTEROL 0.5-2.5 (3) MG/3ML IN SOLN
3.0000 mL | Freq: Two times a day (BID) | RESPIRATORY_TRACT | Status: DC
  Administered 2017-05-06 – 2017-05-07 (×2): 3 mL via RESPIRATORY_TRACT
  Filled 2017-05-06 (×2): qty 3

## 2017-05-06 MED ORDER — LACOSAMIDE 50 MG PO TABS
200.0000 mg | ORAL_TABLET | Freq: Two times a day (BID) | ORAL | Status: DC
  Administered 2017-05-06 – 2017-05-07 (×2): 200 mg via ORAL
  Filled 2017-05-06 (×2): qty 4

## 2017-05-06 MED ORDER — POTASSIUM CHLORIDE 20 MEQ/15ML (10%) PO SOLN
40.0000 meq | Freq: Once | ORAL | Status: AC
  Administered 2017-05-06: 40 meq via ORAL
  Filled 2017-05-06: qty 30

## 2017-05-06 MED ORDER — POLYETHYLENE GLYCOL 3350 17 G PO PACK
17.0000 g | PACK | Freq: Every day | ORAL | Status: DC
  Administered 2017-05-07: 17 g via ORAL
  Filled 2017-05-06: qty 1

## 2017-05-06 MED ORDER — MAGNESIUM SULFATE 4 GM/100ML IV SOLN
4.0000 g | Freq: Once | INTRAVENOUS | Status: AC
Start: 2017-05-06 — End: 2017-05-06
  Administered 2017-05-06: 4 g via INTRAVENOUS
  Filled 2017-05-06: qty 100

## 2017-05-06 MED ORDER — DIVALPROEX SODIUM 250 MG PO DR TAB
750.0000 mg | DELAYED_RELEASE_TABLET | Freq: Two times a day (BID) | ORAL | Status: DC
  Administered 2017-05-06: 750 mg via ORAL
  Filled 2017-05-06 (×3): qty 3

## 2017-05-06 NOTE — Progress Notes (Signed)
Patient ID: [name], female   DOB: [birth year], 66 y.o.   MRN: [id number]  [place] PROGRESS NOTE  [name] WVP:[id number] DOB: [birth year] DOA: 04/29/2017 PCP: Karen [place], MD  HPI/Subjective: Possible short episode of seizure last evening. much alert now and back to her baseline mental status  Objective: Vitals:   05/06/17 0805 05/06/17 0933  BP: (!) 123/55 126/62  Pulse: 60   Resp: 18   Temp: 98 F (36.7 C)   SpO2: 94%     Filed Weights   04/30/17 0500 05/01/17 0354 05/04/17 0757  Weight: 74.9 kg (165 lb 2 oz) 81.4 kg (179 lb 7.3 oz) 77.4 kg (170 lb 9.6 oz)    ROS: Review of Systems  Constitutional: Negative for chills, fever and weight loss.  HENT: Negative for nosebleeds and sore throat.   Eyes: Negative for blurred vision.  Respiratory: Negative for cough, shortness of breath and wheezing.   Cardiovascular: Negative for chest pain, orthopnea, leg swelling and PND.  Gastrointestinal: Negative for abdominal pain, constipation, diarrhea, heartburn, nausea and vomiting.  Genitourinary: Negative for dysuria and urgency.  Musculoskeletal: Negative for back pain.  Skin: Negative for rash.  Neurological: Negative for dizziness, speech change, focal weakness and headaches.  Endo/Heme/Allergies: Does not bruise/bleed easily.  Psychiatric/Behavioral: Negative for depression.   Exam: Physical Exam  HENT:  Nose: No mucosal edema.  Throat dry  Eyes: Pupils are equal, round, and reactive to light. Conjunctivae and lids are normal.  Neck: Carotid bruit is not present. No thyromegaly present.  Cardiovascular: Regular rhythm, S1 normal, S2 normal and normal heart sounds.   Respiratory: She has decreased breath sounds in the right lower field and the left lower field. She has no wheezes. She has no rhonchi. She has no rales.  GI: Soft. Bowel sounds are normal. There is no tenderness.  Musculoskeletal:       Right ankle: She exhibits no swelling.       Left  ankle: She exhibits no swelling.  Neurological: She is alert.  Moves her extremities on her own.  Was able to sit up for me  Skin: Skin is warm. No rash noted.  Psychiatric:  Follows sign language and writes down on the paper to explain her problems      Data Reviewed: Basic Metabolic Panel:  Recent Labs Lab 04/29/17 1523  04/30/17 0358 05/01/17 0355 05/02/17 0530 05/04/17 0515 05/05/17 0518 05/05/17 1814 05/06/17 0451  NA  --   < > 144 143 146* 151* 150*  --  144  K  --   < > 2.9* 4.2 3.4* 3.6 2.6* 2.9* 3.3*  CL  --   < > 106 111 111 112* 110  --  105  CO2  --   < > _0 32  --  32  GLUCOSE  --   < > 328* 177* 124* 211* 204*  --  136*  BUN  --   < > 23* 21* 21* 21* 18  --  11  CREATININE  --   < > 1.72* 1.10* 0.69 0.75 0.71  --  0.62  CALCIUM  --   < > 8.2* 8.3* 8.8* 8.9 8.6*  --  8.0*  MG 1.9  --  1.6* 2.2 1.9 1.6* 1.7  --  1.6*  PHOS 3.5  --  3.2  --  2.2*  --   --   --   --   < > = values in this interval not  displayed. Liver Function Tests:  Recent Labs Lab 04/30/17 0358  AST 24  ALT 9*  ALKPHOS 88  BILITOT 0.5  PROT 6.0*  ALBUMIN 2.9*    Recent Labs Lab 05/04/17 0515  AMMONIA 19   CBC:  Recent Labs Lab 04/30/17 0358 05/01/17 0355 05/04/17 0515 05/05/17 0518 05/06/17 0451  WBC 28.2* 10.0 10.4 6.9 4.8  HGB 13.7 12.8 13.5 13.4 12.6  HCT 41.1 37.6 40.6 40.5 37.8  MCV 84.0 84.2 84.6 85.0 86.2  PLT 473* 202 229 208 172   Cardiac Enzymes: No results for input(s): CKTOTAL, CKMB, CKMBINDEX, TROPONINI in the last 168 hours. BNP (last 3 results)  Recent Labs  02/06/17 1109 03/24/17 1738  BNP 50.0 184.0*     CBG:  Recent Labs Lab 05/05/17 1200 05/05/17 1702 05/05/17 1825 05/05/17 2104 05/06/17 0740  GLUCAP 151* 124* 131* 166* 171*       Studies: No results found.  Scheduled Meds: . citalopram  20 mg Oral Daily  . clopidogrel  75 mg Oral Daily  . diltiazem  180 mg Oral Daily  . divalproex  750 mg Oral Q12H  .  enoxaparin (LOVENOX) injection  40 mg Subcutaneous Q24H  . furosemide  40 mg Intravenous BID  . gabapentin  300 mg Oral BID  . insulin aspart  0-5 Units Subcutaneous QHS  . insulin aspart  0-9 Units Subcutaneous TID WC  . insulin detemir  8 Units Subcutaneous Daily  . ipratropium-albuterol  3 mL Nebulization BID  . lacosamide  200 mg Oral BID  . mouth rinse  15 mL Mouth Rinse BID  . mometasone-formoterol  2 puff Inhalation BID  . montelukast  10 mg Oral QHS  . pantoprazole  40 mg Oral Daily  . [START ON 05/07/2017] polyethylene glycol  17 g Oral Daily  . rOPINIRole  0.5 mg Oral TID  . rosuvastatin  10 mg Oral Daily   Continuous Infusions: . ceFEPime (MAXIPIME) IV Stopped (05/05/17 2159)  . dextrose 5 % and 0.45 % NaCl with KCl 40 mEq/L 75 mL/hr at 05/05/17 2129    Assessment/Plan:  1. Acute on chronic respiratory failure with hypoxia on oxygen.  Status post extubation 04/30/2017. Patient is on 2 L nasal cannula. Continue Maxipime for pneumonia. 2. Acute metabolic encephalopathy likely from prolonged seizure.  EEG showing triphasic waves which is likely metabolic in nature. MRI of the brain with and without contrast shows no acute pathology.   3. Seizure disorder:  - Neuro increased Depakote to 712m BID - Continue seizure precautions 4. Hypernatremia: Likely iatrogenic, slowly improving, sodium of 144 today 5. Type 2 diabetes mellitus on sliding scale, resume her oral medication once eating properly 6. History of cirrhosis: [place] level of 19 7. Lactic acidosis improved with IV fluids. 8. Acute kidney injury.  Improved with IV fluids. 9. Hypokalemia: Replete and recheck, electrolyte protocol per pharmacy  Code Status: DNR  Family Communication: Spoke with son at the bedside. Disposition Plan: Skilled nursing facility in 1-2 days depending on clinical condition and Neuro eval  Consultants:  Neurology  Antibiotics:  Maxipime  Time spent: 30 minutes.   [name]  SB[employer] Buy

## 2017-05-06 NOTE — Progress Notes (Signed)
Subjective: Patient continues to improve.  Appears at baseline today.  Had an episode last evening when she stopped talking and her eyes began to flutter.  Patient then became unresponsive.  After a few minutes returned to baseline.  .    Objective: Current vital signs: BP 126/62   Pulse 60   Temp 98 F (36.7 C) (Oral)   Resp 18   Ht _0  (1.575 m)   Wt 77.4 kg (170 lb 9.6 oz)   SpO2 94%   BMI 31.20 kg/m  Vital signs in last 24 hours: Temp:  [97.7 F (36.5 C)-98.5 F (36.9 C)] 98 F (36.7 C) (10/31 0805) Pulse Rate:  [60-66] 60 (10/31 0805) Resp:  [18-19] 18 (10/31 0805) BP: (104-135)/(44-65) 126/62 (10/31 0933) SpO2:  [91 %-95 %] 94 % (10/31 0805)  Intake/Output from previous day: 10/30 0701 - 10/31 0700 In: 1720 [P.O.:120; I.V.:1000; IV Piggyback:600] Out: 2600 [Urine:2600] Intake/Output this shift: Total I/O In: -  Out: 400 [Urine:400] Nutritional status: DIET DYS 3 Room service appropriate? Yes with Assist; Fluid consistency: Thin  Neurologic Exam: Mental Status: Alert.  Follows simple commands.  Reading lips. Cranial Nerves: II: Discs flat bilaterally; Visual fields grossly normal, pupils equal, round, reactive to light and accommodation III,IV, VI: ptosis not present, extra-ocular motions intact bilaterally V,VII: smile symmetric, facial light touch sensation normal bilaterally VIII: hearing normal bilaterally IX,X: gag reflex present XI: bilateral shoulder shrug XII: midline tongue extension Motor: Right :  Upper extremity   5/5                                      Left:     Upper extremity   5/5             Lower extremity   5/5                                                  Lower extremity   5/5 Tone and bulk:normal tone throughout; no atrophy noted  Lab Results: Basic Metabolic Panel:  Recent Labs Lab 04/29/17 1523  04/30/17 0358 05/01/17 0355 05/02/17 0530 05/04/17 0515 05/05/17 0518 05/05/17 1814 05/06/17 0451  NA  --   < > 144 143 146*  151* 150*  --  144  K  --   < > 2.9* 4.2 3.4* 3.6 2.6* 2.9* 3.3*  CL  --   < > 106 111 111 112* 110  --  105  CO2  --   < > _1 32  --  32  GLUCOSE  --   < > 328* 177* 124* 211* 204*  --  136*  BUN  --   < > 23* 21* 21* 21* 18  --  11  CREATININE  --   < > 1.72* 1.10* 0.69 0.75 0.71  --  0.62  CALCIUM  --   < > 8.2* 8.3* 8.8* 8.9 8.6*  --  8.0*  MG 1.9  --  1.6* 2.2 1.9 1.6* 1.7  --  1.6*  PHOS 3.5  --  3.2  --  2.2*  --   --   --   --   < > = values in this interval not displayed.  Liver Function Tests:  Recent  Labs Lab 04/30/17 0358  AST 24  ALT 9*  ALKPHOS 88  BILITOT 0.5  PROT 6.0*  ALBUMIN 2.9*   No results for input(s): LIPASE, AMYLASE in the last 168 hours.  Recent Labs Lab 05/04/17 0515  AMMONIA 19    CBC:  Recent Labs Lab 04/30/17 0358 05/01/17 0355 05/04/17 0515 05/05/17 0518 05/06/17 0451  WBC 28.2* 10.0 10.4 6.9 4.8  HGB 13.7 12.8 13.5 13.4 12.6  HCT 41.1 37.6 40.6 40.5 37.8  MCV 84.0 84.2 84.6 85.0 86.2  PLT 473* 202 229 208 172    Cardiac Enzymes: No results for input(s): CKTOTAL, CKMB, CKMBINDEX, TROPONINI in the last 168 hours.  Lipid Panel:  Recent Labs Lab 04/29/17 1523  TRIG 133    CBG:  Recent Labs Lab 05/05/17 1200 05/05/17 1702 05/05/17 1825 05/05/17 2104 05/06/17 0740  GLUCAP 151* 124* 131* 166* 171*    Microbiology: Results for orders placed or performed during the hospital encounter of 04/29/17  Blood culture (routine x 2)     Status: None   Collection Time: 04/29/17 12:38 PM  Result Value Ref Range Status   Specimen Description BLOOD RFOA  Final   Special Requests   Final    BOTTLES DRAWN AEROBIC AND ANAEROBIC Blood Culture adequate volume   Culture NO GROWTH 5 DAYS  Final   Report Status 05/04/2017 FINAL  Final  Blood culture (routine x 2)     Status: None   Collection Time: 04/29/17 12:39 PM  Result Value Ref Range Status   Specimen Description BLOOD RIGHT HAND  Final   Special Requests   Final     BOTTLES DRAWN AEROBIC AND ANAEROBIC Blood Culture adequate volume   Culture NO GROWTH 5 DAYS  Final   Report Status 05/04/2017 FINAL  Final  Culture, respiratory (NON-Expectorated)     Status: None   Collection Time: 04/29/17  3:00 PM  Result Value Ref Range Status   Specimen Description TRACHEAL ASPIRATE  Final   Special Requests NONE  Final   Gram Stain   Final    MODERATE WBC PRESENT, PREDOMINANTLY PMN FEW GRAM POSITIVE COCCI FEW GRAM POSITIVE RODS    Culture   Final    Consistent with normal respiratory flora. Performed at [facility], [place] [address] [place], [place] [address]    Report Status 05/02/2017 FINAL  Final  MRSA PCR Screening     Status: None   Collection Time: 04/29/17  4:00 PM  Result Value Ref Range Status   MRSA by PCR NEGATIVE NEGATIVE Final    Comment:        The GeneXpert MRSA Assay (FDA approved for NASAL specimens only), is one component of a comprehensive MRSA colonization surveillance program. It is not intended to diagnose MRSA infection nor to guide or monitor treatment for MRSA infections.     Coagulation Studies: No results for input(s): LABPROT, INR in the last 72 hours.  Imaging: No results found.  Medications:  I have reviewed the patient's current medications. Scheduled: . citalopram  20 mg Oral Daily  . clopidogrel  75 mg Oral Daily  . diltiazem  180 mg Oral Daily  . enoxaparin (LOVENOX) injection  40 mg Subcutaneous Q24H  . furosemide  40 mg Intravenous BID  . gabapentin  300 mg Oral BID  . insulin aspart  0-5 Units Subcutaneous QHS  . insulin aspart  0-9 Units Subcutaneous TID WC  . insulin detemir  8 Units Subcutaneous Daily  . ipratropium-albuterol  3 mL Nebulization BID  . mouth rinse  15 mL Mouth Rinse BID  . mometasone-formoterol  2 puff Inhalation BID  . montelukast  10 mg Oral QHS  . pantoprazole  40 mg Oral Daily  . [START ON 05/07/2017] polyethylene glycol  17 g Oral Daily  . rOPINIRole  0.5 mg Oral TID   . rosuvastatin  10 mg Oral Daily    Assessment/Plan: Patient appears at baseline.  With short seizure noted on yesterday.  Depakote level of 49.  Recommendations: 1.  Will change antiepileptic medications to po and increase Depakote to 742m BID 2.  Depakote level in AM 3.  Continue seizure precautions   LOS: 7 days   LAlexis Goodell MD Neurology [phone]/31/2018  2:33 PM

## 2017-05-06 NOTE — Progress Notes (Signed)
Pharmacy Antibiotic Note  [name] is a 66 y.o. female admitted on 04/29/2017 with sepsis.  Pharmacy has been consulted for cefepime dosing for PNA. Patient has tolerated cephalosporins on previous admissions.   Plan: Continue cefepime to 2 g IV q12h. Plan for 10 days tx per MD.   Height: _0  (157.5 cm) Weight: 170 lb 9.6 oz (77.4 kg) IBW/kg (Calculated) : 50.1  Temp (24hrs), Avg:98.2 F (36.8 C), Min:97.7 F (36.5 C), Max:98.5 F (36.9 C)   Recent Labs Lab 04/29/17 1115 04/29/17 1315 04/29/17 1812 04/30/17 0358 04/30/17 1704 05/01/17 0355 05/02/17 0530 05/04/17 0515 05/05/17 0518 05/06/17 0451  WBC 21.8*  --   --  28.2*  --  10.0  --  10.4 6.9 4.8  CREATININE 1.83*  --   --  1.72*  --  1.10* 0.69 0.75 0.71 0.62  LATICACIDVEN 2.1* 1.5 6.5*  --  1.8  --   --   --   --   --     Estimated Creatinine Clearance: 67.5 mL/min (by C-G formula based on SCr of 0.62 mg/dL).    Allergies  Allergen Reactions  . Aspirin Itching  . Celebrex [Celecoxib] Itching    itching  . Ciprofloxacin Itching  . Codeine Itching  . Fosphenytoin Itching  . Levaquin [Levofloxacin In D5w] Itching  . Levofloxacin Itching  . Lovastatin Itching  . Penicillins     Documentation indicates severe reaction  Pt tolerated cephalosporin without adverse reaction 09/18   . Pravastatin Itching  . Sulfa Antibiotics Itching    Antimicrobials this admission: Vancomycin x 1 in the ED, 10/24.  Cefepime 10/24 >>   Dose adjustments this admission: N/A  Microbiology results: 10/24 BCx: No growth 4 days 10/24 Sputum: Normal flora 10/24 MRSA PCR: negative    Thank you for allowing pharmacy to be a part of this patient's care.  Noralee Space, PharmD Clinical Pharmacist 05/06/2017 7:38 AM

## 2017-05-06 NOTE — Progress Notes (Signed)
Pt more alert and talking with staff this shift. Pt able to follow commands. Pt was refusing to take seizure medication, but with some education about the importance of the seizure medication she agreed and took it. Pt eating and drinking without difficulty. Up to bedside commode and voiding without difficulty. Pt able to sleep in between care.

## 2017-05-06 NOTE — Progress Notes (Signed)
[facility] for Electrolyte management Indication: hypokalemia  Allergies  Allergen Reactions  . Aspirin Itching  . Celebrex [Celecoxib] Itching    itching  . Ciprofloxacin Itching  . Codeine Itching  . Fosphenytoin Itching  . Levaquin [Levofloxacin In D5w] Itching  . Levofloxacin Itching  . Lovastatin Itching  . Penicillins     Documentation indicates severe reaction  Pt tolerated cephalosporin without adverse reaction 09/18   . Pravastatin Itching  . Sulfa Antibiotics Itching    Patient Measurements: Height: 5' 2" (157.5 cm) Weight: 170 lb 9.6 oz (77.4 kg) IBW/kg (Calculated) : 50.1 Adjusted Body Weight:    Labs:  Recent Labs  05/04/17 0515 05/05/17 0518 05/06/17 0451  WBC 10.4 6.9 4.8  HGB 13.5 13.4 12.6  HCT 40.6 40.5 37.8  PLT 229 208 172  CREATININE 0.75 0.71 0.62  MG 1.6* 1.7 1.6*   Lab Results  Component Value Date   K 3.3 (L) 05/06/2017   Estimated Creatinine Clearance: 67.5 mL/min (by C-G formula based on SCr of 0.62 mg/dL).  Medical History: Past Medical History:  Diagnosis Date  . Asthma   . Cirrhosis, non-alcoholic ([place])   . COPD (chronic obstructive pulmonary disease) ([place])   . Deaf   . Depression   . Diabetes mellitus without complication ([place])   . GERD (gastroesophageal reflux disease)   . Heart murmur   . Hepatitis   . Hypertension   . Lymph node disorder    arm  . Neuropathy   . On home oxygen therapy    hs  . Orthopnea   . RLS (restless legs syndrome)   . Seizures ([place])   . Shortness of breath dyspnea   . Sleep apnea   . Stroke ([place])    tia    Medications:  Scheduled:  . citalopram  20 mg Oral Daily  . clopidogrel  75 mg Oral Daily  . diltiazem  180 mg Oral Daily  . enoxaparin (LOVENOX) injection  40 mg Subcutaneous Q24H  . furosemide  40 mg Intravenous BID  . gabapentin  300 mg Oral BID  . insulin aspart  0-5 Units Subcutaneous QHS  . insulin aspart  0-9 Units Subcutaneous  TID WC  . insulin detemir  8 Units Subcutaneous Daily  . ipratropium-albuterol  3 mL Nebulization Q6H  . mouth rinse  15 mL Mouth Rinse BID  . mometasone-formoterol  2 puff Inhalation BID  . montelukast  10 mg Oral QHS  . pantoprazole  40 mg Oral Daily  . potassium chloride  40 mEq Oral Once  . rOPINIRole  0.5 mg Oral TID  . rosuvastatin  10 mg Oral Daily  . senna-docusate  2 tablet Oral BID   Infusions:  . ceFEPime (MAXIPIME) IV Stopped (05/05/17 2159)  . dextrose 5 % and 0.45 % NaCl with KCl 40 mEq/L 75 mL/hr at 05/05/17 2129  . lacosamide (VIMPAT) IV 200 mg (05/05/17 2216)  . magnesium sulfate 1 - 4 g bolus IVPB    . valproate sodium Stopped (05/05/17 2229)    Assessment: 66 yo F admitted with seizures, now with hypokalemia.  Patient started lasix 40 mg IV q12h on 10/29.  10/30:  K 2.6, Mag 1.7  Na+ 150 10/30 21825  K 2.9 10/31 am labs  K 3.3,  Mag 1.6  Goal of Therapy:  Electrolytes WNL  Plan:  Patient was started on D51/2NS w/ 40 meq KCL at 75 ml/hr this am 10/30 by MD.  Will add IV KCL 40 meq x1 (10 meq x 4 bags) as patient is NPO d/t swallowing issues. Will give magnesium sulfate 2 gram IV x 1.  Will recheck K at 1800 and electrolytes with am labs.  10/30 1825 K 2.9. IVF with KCl 40 mEq/L still running at 75 mL/hr. Will give potassium chloride 10% oral solution 40 mEq po Q4H x 2 doses and recheck BMP and magnesium with AM labs.  10/31: Patient on D51/2NS w/ 40 meq KCL at 75 ml/hr. MD requests using KCL liquid or packets as patient having trouble swallowing tablets. Will add KCL PO liquid 40 meq x 1. Consider scheduled KCL if patient to remain on Furosemide.  Will order Mag sulfate 4 gm IV x 1. F/u with am labs  Chinita Greenland PharmD Clinical Pharmacist 05/06/2017

## 2017-05-06 NOTE — Progress Notes (Signed)
Physical Therapy Treatment Patient Details Name: [name] MRN: [id number] DOB: 1950-10-16 Today's Date: 05/06/2017    History of Present Illness 66yo female pt presented to ER secondary to SOB with recurrent seizures (5-10) in route to hospital via EMS; admitted with acute/chronic respiratory failure and sepsis related to HCAP.  Intubated 10/24-10/25 for airway protection; now weaned to 4L supplemental O2 via [place].     PT Comments    Marked improvement in alertness and mental clarity this date. Oriented x4, following all commands and conversing appropriately with therapist throughout session. Per her report, does prefer lip reading or writing as means of communication; declines ASL interpreter at this time. Reports that prior to admission, she lived alone in single-story home with level entrance; ambulatory without assist device, indep with all ADLs and mobility; wears O2 at night only. With improvement in mental status, patient also demonstrating marked improvement in functional performance and overall activity tolerance, demonstrating all transfers and gait (>100' x2) with L HHA, min assist from therapist.  Poor dynamic balance reactions evident (BERG 30/56); may consider trial of assist device in subsequent sessions. Patient very eager and motivated to participate/progress.  Anticipate steady progress towards all mobility goals with appropriate post-acute rehab therapies.    Follow Up Recommendations  SNF     Equipment Recommendations       Recommendations for Other Services       Precautions / Restrictions Precautions Precautions: Fall Restrictions Weight Bearing Restrictions: No    Mobility  Bed Mobility Overal bed mobility: Modified Independent                Transfers Overall transfer level: Needs assistance Equipment used: None Transfers: Sit to/from Stand Sit to Stand: Min assist            Ambulation/Gait Ambulation/Gait assistance: Min  assist Ambulation Distance (Feet): >100 Feet (x2) Assistive device: None       General Gait Details: narrowed BOS with intermittent scissoring; veers laterally with any dynamic gait components requiring min assist from therapist for correction.  Slow, guarded cadence with poor balance abilities.  Sats maintained >92% on 1L, >90% on 1L with all gait efforts   Stairs            Wheelchair Mobility    Modified Rankin (Stroke Patients Only)       Balance Overall balance assessment: Needs assistance Sitting-balance support: No upper extremity supported;Feet supported Sitting balance-Leahy Scale: Normal     Standing balance support: No upper extremity supported Standing balance-Leahy Scale: Poor                   Standardized Balance Assessment Standardized Balance Assessment : Berg Balance Test Berg Balance Test Sit to Stand: Able to stand without using hands and stabilize independently Standing Unsupported: Able to stand 2 minutes with supervision Sitting with Back Unsupported but Feet Supported on Floor or Stool: Able to sit safely and securely 2 minutes Stand to Sit: Sits safely with minimal use of hands Transfers: Able to transfer safely, definite need of hands Standing Unsupported with Eyes Closed: Able to stand 10 seconds with supervision Standing Ubsupported with Feet Together: Needs help to attain position but able to stand for 30 seconds with feet together From Standing, Reach Forward with Outstretched Arm: Reaches forward but needs supervision From Standing Position, Pick up Object from Floor: Able to pick up shoe, needs supervision From Standing Position, Turn to Look Behind Over each Shoulder: Turn sideways only but maintains balance  Turn 360 Degrees: Needs close supervision or verbal cueing Standing Unsupported, Alternately Place Feet on Step/Stool: Needs assistance to keep from falling or unable to try Standing Unsupported, One Foot in Front: Needs help  to step but can hold 15 seconds Standing on One Leg: Unable to try or needs assist to prevent fall Total Score: 30        Cognition Arousal/Alertness: Awake/alert Behavior During Therapy: WFL for tasks assessed/performed Overall Cognitive Status: Within Functional Limits for tasks assessed                                        Exercises Other Exercises Other Exercises: Toilet transfer, ambulatory without assist device, cga/min assist; sit/stand from standard toilet, cga/min assist; standing balance for hygiene, cga/close sup.  Fair awarenes sof limits of stability; limited balance reactions evident (relying on UEs for external stabilization at times)    General Comments        Pertinent Vitals/Pain Pain Assessment: No/denies pain    Home Living                      Prior Function            PT Goals (current goals can now be found in the care plan section) Acute Rehab PT Goals PT Goal Formulation: Patient unable to participate in goal setting Time For Goal Achievement: 05/17/17 Potential to Achieve Goals: Good Progress towards PT goals: Progressing toward goals    Frequency    Min 2X/week      PT Plan Current plan remains appropriate    Co-evaluation              AM-PAC PT "6 Clicks" Daily Activity  Outcome Measure  Difficulty turning over in bed (including adjusting bedclothes, sheets and blankets)?: None Difficulty moving from lying on back to sitting on the side of the bed? : None Difficulty sitting down on and standing up from a chair with arms (e.g., wheelchair, bedside commode, etc,.)?: A Lot Help needed moving to and from a bed to chair (including a wheelchair)?: A Little Help needed walking in hospital room?: A Little Help needed climbing 3-5 steps with a railing? : A Little 6 Click Score: 19    End of Session Equipment Utilized During Treatment: Gait belt Activity Tolerance: Patient tolerated treatment  well Patient left: in bed;with call bell/phone within reach;with bed alarm set Nurse Communication: Mobility status (tolerance for activity on RA; left on RA end of session) PT Visit Diagnosis: Difficulty in walking, not elsewhere classified (R26.2);Muscle weakness (generalized) (M62.81);Unsteadiness on feet (R26.81)     Time: 2119-4174 PT Time Calculation (min) (ACUTE ONLY): 30 min  Charges:  $Gait Training: 8-22 mins $Therapeutic Activity: 8-22 mins                    G Codes:       Rossi Burdo H. Owens Shark, [place], DPT, NCS 05/06/17, 4:51 PM [phone]

## 2017-05-06 NOTE — Progress Notes (Signed)
Pharmacy consult: Drug interaction and monitoring of antiepileptic medications  Patient currently on Vimpat (lacosamide) 229m IV q12h, Depacon (valproate) 5063mIV q12h and also gabapentin 30086mid.  10/31 VPA= 49 (therapeutic range 50-100m19ml) Patient medication list was analyzed and no clinically relevant interactions were found.   KrisChinita GreenlandrmD Clinical Pharmacist 05/06/2017

## 2017-05-07 DIAGNOSIS — K219 Gastro-esophageal reflux disease without esophagitis: Secondary | ICD-10-CM | POA: Diagnosis not present

## 2017-05-07 DIAGNOSIS — K210 Gastro-esophageal reflux disease with esophagitis: Secondary | ICD-10-CM | POA: Diagnosis not present

## 2017-05-07 DIAGNOSIS — R296 Repeated falls: Secondary | ICD-10-CM | POA: Diagnosis not present

## 2017-05-07 DIAGNOSIS — J9621 Acute and chronic respiratory failure with hypoxia: Secondary | ICD-10-CM | POA: Diagnosis not present

## 2017-05-07 DIAGNOSIS — I5022 Chronic systolic (congestive) heart failure: Secondary | ICD-10-CM | POA: Diagnosis not present

## 2017-05-07 DIAGNOSIS — Z5189 Encounter for other specified aftercare: Secondary | ICD-10-CM | POA: Diagnosis not present

## 2017-05-07 DIAGNOSIS — J45909 Unspecified asthma, uncomplicated: Secondary | ICD-10-CM | POA: Diagnosis not present

## 2017-05-07 DIAGNOSIS — R569 Unspecified convulsions: Secondary | ICD-10-CM | POA: Diagnosis not present

## 2017-05-07 DIAGNOSIS — I2723 Pulmonary hypertension due to lung diseases and hypoxia: Secondary | ICD-10-CM | POA: Diagnosis not present

## 2017-05-07 DIAGNOSIS — R1312 Dysphagia, oropharyngeal phase: Secondary | ICD-10-CM | POA: Diagnosis not present

## 2017-05-07 DIAGNOSIS — I10 Essential (primary) hypertension: Secondary | ICD-10-CM | POA: Diagnosis not present

## 2017-05-07 DIAGNOSIS — E119 Type 2 diabetes mellitus without complications: Secondary | ICD-10-CM | POA: Diagnosis not present

## 2017-05-07 DIAGNOSIS — Z8673 Personal history of transient ischemic attack (TIA), and cerebral infarction without residual deficits: Secondary | ICD-10-CM | POA: Diagnosis not present

## 2017-05-07 DIAGNOSIS — J189 Pneumonia, unspecified organism: Secondary | ICD-10-CM | POA: Diagnosis not present

## 2017-05-07 DIAGNOSIS — N390 Urinary tract infection, site not specified: Secondary | ICD-10-CM | POA: Diagnosis not present

## 2017-05-07 DIAGNOSIS — R41841 Cognitive communication deficit: Secondary | ICD-10-CM | POA: Diagnosis not present

## 2017-05-07 DIAGNOSIS — R0902 Hypoxemia: Secondary | ICD-10-CM | POA: Diagnosis not present

## 2017-05-07 DIAGNOSIS — J449 Chronic obstructive pulmonary disease, unspecified: Secondary | ICD-10-CM | POA: Diagnosis not present

## 2017-05-07 DIAGNOSIS — I509 Heart failure, unspecified: Secondary | ICD-10-CM | POA: Diagnosis not present

## 2017-05-07 DIAGNOSIS — M6281 Muscle weakness (generalized): Secondary | ICD-10-CM | POA: Diagnosis not present

## 2017-05-07 DIAGNOSIS — D649 Anemia, unspecified: Secondary | ICD-10-CM | POA: Diagnosis not present

## 2017-05-07 DIAGNOSIS — I6990 Unspecified sequelae of unspecified cerebrovascular disease: Secondary | ICD-10-CM | POA: Diagnosis not present

## 2017-05-07 DIAGNOSIS — R262 Difficulty in walking, not elsewhere classified: Secondary | ICD-10-CM | POA: Diagnosis not present

## 2017-05-07 DIAGNOSIS — Z7401 Bed confinement status: Secondary | ICD-10-CM | POA: Diagnosis not present

## 2017-05-07 DIAGNOSIS — Z7984 Long term (current) use of oral hypoglycemic drugs: Secondary | ICD-10-CM | POA: Diagnosis not present

## 2017-05-07 DIAGNOSIS — I2510 Atherosclerotic heart disease of native coronary artery without angina pectoris: Secondary | ICD-10-CM | POA: Diagnosis not present

## 2017-05-07 DIAGNOSIS — G40901 Epilepsy, unspecified, not intractable, with status epilepticus: Secondary | ICD-10-CM | POA: Diagnosis not present

## 2017-05-07 DIAGNOSIS — J4520 Mild intermittent asthma, uncomplicated: Secondary | ICD-10-CM | POA: Diagnosis not present

## 2017-05-07 DIAGNOSIS — Z7902 Long term (current) use of antithrombotics/antiplatelets: Secondary | ICD-10-CM | POA: Diagnosis not present

## 2017-05-07 DIAGNOSIS — G9341 Metabolic encephalopathy: Secondary | ICD-10-CM | POA: Diagnosis not present

## 2017-05-07 LAB — BASIC METABOLIC PANEL
Anion gap: 9 (ref 5–15)
BUN: 8 mg/dL (ref 6–20)
CO2: 30 mmol/L (ref 22–32)
CREATININE: 0.53 mg/dL (ref 0.44–1.00)
Calcium: 8.3 mg/dL — ABNORMAL LOW (ref 8.9–10.3)
Chloride: 102 mmol/L (ref 101–111)
GFR calc Af Amer: >60 mL/min (ref >60)
GLUCOSE: 157 mg/dL — AB (ref 65–99)
POTASSIUM: 3.9 mmol/L (ref 3.5–5.1)
Sodium: 141 mmol/L (ref 135–145)

## 2017-05-07 LAB — GLUCOSE, CAPILLARY
GLUCOSE-CAPILLARY: 182 mg/dL — AB (ref 65–99)
GLUCOSE-CAPILLARY: 136 mg/dL — AB (ref 65–99)
Glucose-Capillary: 129 mg/dL — ABNORMAL HIGH (ref 65–99)

## 2017-05-07 LAB — MAGNESIUM: Magnesium: 1.7 mg/dL (ref 1.7–2.4)

## 2017-05-07 LAB — VALPROIC ACID LEVEL: Valproic Acid Lvl: 89 ug/mL (ref 50.0–100.0)

## 2017-05-07 MED ORDER — GLIPIZIDE XL 5 MG PO TB24: 5.0000 mg | ORAL_TABLET | Freq: Every day | ORAL | Status: DC

## 2017-05-07 MED ORDER — MAGNESIUM OXIDE 400 (241.3 MG) MG PO TABS
400.0000 mg | ORAL_TABLET | Freq: Every day | ORAL | Status: DC
  Administered 2017-05-07: 400 mg via ORAL
  Filled 2017-05-07: qty 1

## 2017-05-07 MED ORDER — LACOSAMIDE 200 MG PO TABS: 200.0000 mg | ORAL_TABLET | Freq: Two times a day (BID) | ORAL | 0 refills | Status: DC

## 2017-05-07 MED ORDER — FUROSEMIDE 10 MG/ML IJ SOLN
40.0000 mg | Freq: Once | INTRAMUSCULAR | Status: AC
  Administered 2017-05-07: 40 mg via INTRAVENOUS
  Filled 2017-05-07: qty 4

## 2017-05-07 MED ORDER — DIVALPROEX SODIUM 250 MG PO DR TAB: 750.0000 mg | DELAYED_RELEASE_TABLET | Freq: Two times a day (BID) | ORAL | 0 refills | Status: DC

## 2017-05-07 MED ORDER — MAGNESIUM OXIDE 400 (241.3 MG) MG PO TABS: 400.0000 mg | ORAL_TABLET | Freq: Every day | ORAL | 0 refills | Status: DC

## 2017-05-07 NOTE — Progress Notes (Signed)
Patient ambulated to stretcher.  A&Ox4.  Discharged to [facility] via [facility].  No distress noted when leaving 1A.

## 2017-05-07 NOTE — Progress Notes (Signed)
Subjective: Patient is sitting up in bed.  Awake and alert.  She is signing.  She is able to read lips and follow commands.  No noted seizures overnight.  Objective: Current vital signs: BP (!) 123/46   Pulse 69   Temp 98.1 F (36.7 C) (Axillary)   Resp 16   Ht 5' 2" (1.575 m)   Wt 77.7 kg (171 lb 3.2 oz)   SpO2 93%   BMI 31.31 kg/m  Vital signs in last 24 hours: Temp:  [98.1 F (36.7 C)-98.4 F (36.9 C)] 98.1 F (36.7 C) (11/01 1006) Pulse Rate:  [69] 69 (10/31 2259) Resp:  [16] 16 (11/01 1006) BP: (123-142)/(46-55) 123/46 (11/01 1006) SpO2:  [90 %-94 %] 93 % (11/01 1006) Weight:  [77.7 kg (171 lb 3.2 oz)] 77.7 kg (171 lb 3.2 oz) (11/01 0600)  Intake/Output from previous day: 10/31 0701 - 11/01 0700 In: -  Out: 400 [Urine:400] Intake/Output this shift: No intake/output data recorded. Nutritional status: DIET DYS 3 Room service appropriate? Yes with Assist; Fluid consistency: Thin  Neurologic Exam: Mental Status: Alert. Follows simple commands. Reading lips. Cranial Nerves: II: Discs flat bilaterally; Visual fields grossly normal, pupils equal, round, reactive to light and accommodation III,IV, VI: ptosis not present, extra-ocular motions intact bilaterally V,VII: smile symmetric, facial light touch sensation normal bilaterally VIII: hearing normal bilaterally IX,X: gag reflex present XI: bilateral shoulder shrug XII: midline tongue extension Motor: Right :Upper extremity 5/5Left: Upper extremity 5/5 Lower extremity 5/5Lower extremity 5/5 Tone and bulk:normal tone throughout; no atrophy noted   Lab Results: Basic Metabolic Panel:  Recent Labs Lab 05/02/17 0530 05/04/17 0515 05/05/17 0518 05/05/17 1814 05/06/17 0451 05/07/17 0336  NA 146* 151* 150*  --  144 141  K 3.4* 3.6 2.6* 2.9* 3.3* 3.9  CL 111 112* 110  --  105 102  CO2 28 24 32  --   32 30  GLUCOSE 124* 211* 204*  --  136* 157*  BUN 21* 21* 18  --  11 8  CREATININE 0.69 0.75 0.71  --  0.62 0.53  CALCIUM 8.8* 8.9 8.6*  --  8.0* 8.3*  MG 1.9 1.6* 1.7  --  1.6* 1.7  PHOS 2.2*  --   --   --   --   --     Liver Function Tests: No results for input(s): AST, ALT, ALKPHOS, BILITOT, PROT, ALBUMIN in the last 168 hours. No results for input(s): LIPASE, AMYLASE in the last 168 hours.  Recent Labs Lab 05/04/17 0515  AMMONIA 19    CBC:  Recent Labs Lab 05/01/17 0355 05/04/17 0515 05/05/17 0518 05/06/17 0451  WBC 10.0 10.4 6.9 4.8  HGB 12.8 13.5 13.4 12.6  HCT 37.6 40.6 40.5 37.8  MCV 84.2 84.6 85.0 86.2  PLT 202 229 208 172    Cardiac Enzymes: No results for input(s): CKTOTAL, CKMB, CKMBINDEX, TROPONINI in the last 168 hours.  Lipid Panel: No results for input(s): CHOL, TRIG, HDL, CHOLHDL, VLDL, LDLCALC in the last 168 hours.  CBG:  Recent Labs Lab 05/06/17 1617 05/06/17 2214 05/07/17 0736 05/07/17 0937 05/07/17 1151  GLUCAP 121* 108* 129* 182* 136*    Microbiology: Results for orders placed or performed during the hospital encounter of 04/29/17  Blood culture (routine x 2)     Status: None   Collection Time: 04/29/17 12:38 PM  Result Value Ref Range Status   Specimen Description BLOOD RFOA  Final   Special Requests   Final  BOTTLES DRAWN AEROBIC AND ANAEROBIC Blood Culture adequate volume   Culture NO GROWTH 5 DAYS  Final   Report Status 05/04/2017 FINAL  Final  Blood culture (routine x 2)     Status: None   Collection Time: 04/29/17 12:39 PM  Result Value Ref Range Status   Specimen Description BLOOD RIGHT HAND  Final   Special Requests   Final    BOTTLES DRAWN AEROBIC AND ANAEROBIC Blood Culture adequate volume   Culture NO GROWTH 5 DAYS  Final   Report Status 05/04/2017 FINAL  Final  Culture, respiratory (NON-Expectorated)     Status: None   Collection Time: 04/29/17  3:00 PM  Result Value Ref Range Status   Specimen Description  TRACHEAL ASPIRATE  Final   Special Requests NONE  Final   Gram Stain   Final    MODERATE WBC PRESENT, PREDOMINANTLY PMN FEW GRAM POSITIVE COCCI FEW GRAM POSITIVE RODS    Culture   Final    Consistent with normal respiratory flora. Performed at [facility], [place] [address], [place],  [address]    Report Status 05/02/2017 FINAL  Final  MRSA PCR Screening     Status: None   Collection Time: 04/29/17  4:00 PM  Result Value Ref Range Status   MRSA by PCR NEGATIVE NEGATIVE Final    Comment:        The GeneXpert MRSA Assay (FDA approved for NASAL specimens only), is one component of a comprehensive MRSA colonization surveillance program. It is not intended to diagnose MRSA infection nor to guide or monitor treatment for MRSA infections.     Coagulation Studies: No results for input(s): LABPROT, INR in the last 72 hours.  Imaging: No results found.  Medications:  I have reviewed the patient's current medications. Scheduled: . citalopram  20 mg Oral Daily  . clopidogrel  75 mg Oral Daily  . diltiazem  180 mg Oral Daily  . divalproex  750 mg Oral Q12H  . enoxaparin (LOVENOX) injection  40 mg Subcutaneous Q24H  . gabapentin  300 mg Oral BID  . insulin aspart  0-5 Units Subcutaneous QHS  . insulin aspart  0-9 Units Subcutaneous TID WC  . insulin detemir  8 Units Subcutaneous Daily  . ipratropium-albuterol  3 mL Nebulization BID  . lacosamide  200 mg Oral BID  . magnesium oxide  400 mg Oral Daily  . mouth rinse  15 mL Mouth Rinse BID  . mometasone-formoterol  2 puff Inhalation BID  . montelukast  10 mg Oral QHS  . pantoprazole  40 mg Oral Daily  . rOPINIRole  0.5 mg Oral TID  . rosuvastatin  10 mg Oral Daily    Assessment/Plan: Patient doing well.  No further seizures noted.  On Vimpat and Depakote.  Patient tolerating increase in Depakote without noted side effects.  Depakote level 89.  Recommendations: 1.  Continue current anticonvulsant regimen. 2.   Due to dramatic change in Depakote level in the past 24 hours will recheck level in the morning. 3.  Patient to ambulate.   LOS: 8 days   Alexis Goodell, MD Neurology [phone] 05/07/2017  1:22 PM

## 2017-05-07 NOTE — Progress Notes (Signed)
Report called to Maudie Mercury, LPN at [facility].

## 2017-05-07 NOTE — Clinical Social Work Placement (Signed)
   CLINICAL SOCIAL WORK PLACEMENT  NOTE  Date:  05/07/2017  Patient Details  Name: [name] MRN: [id number] Date of Birth: Nov 24, 1950  Clinical Social Work is seeking post-discharge placement for this patient at the [place] level of care (*CSW will initial, date and re-position this form in  chart as items are completed):  Yes   Patient/family provided with [facility] Work Department's list of facilities offering this level of care within the geographic area requested by the patient (or if unable, by the patient's family).  Yes   Patient/family informed of their freedom to choose among providers that offer the needed level of care, that participate in Medicare, Medicaid or managed care program needed by the patient, have an available bed and are willing to accept the patient.  Yes   Patient/family informed of [facility]'s ownership interest in [facility] and [facility], as well as of the fact that they are under no obligation to receive care at these facilities.  PASRR submitted to EDS on 05/04/17     PASRR number received on 05/04/17     Existing PASRR number confirmed on       FL2 transmitted to all facilities in geographic area requested by pt/family on 05/04/17     FL2 transmitted to all facilities within larger geographic area on       Patient informed that his/her managed care company has contracts with or will negotiate with certain facilities, including the following:        Yes   Patient/family informed of bed offers received.  Patient chooses bed at  ([facility]     Physician recommends and patient chooses bed at      Patient to be transferred to  ([facility]) on 05/07/17.  Patient to be transferred to facility by  [facility])     Patient family notified on 05/07/17 of transfer.  Name of family member notified:   (Patient's son [name] is at bedside and aware of D/C today. CSW contacted patient's daughter Kenney Houseman and  made her aware of D/C today. )     PHYSICIAN       Additional Comment:    _______________________________________________ Kwadwo Taras, Veronia Beets, LCSW 05/07/2017, 2:29 PM

## 2017-05-07 NOTE — Discharge Summary (Signed)
[facility] [place] [place] NAME: [name]    MR#:  [id number]  DATE OF BIRTH:  01-Jan-1951  DATE OF ADMISSION:  04/29/2017 ADMITTING PHYSICIAN: Max Sane, MD  DATE OF DISCHARGE: 05/07/2017  PRIMARY CARE PHYSICIAN: Cletis Athens, MD    ADMISSION DIAGNOSIS:  Hypokalemia [E87.6] Status epilepticus ([place]) [G40.901] Acute respiratory failure with hypoxia ([place]) [J96.01] AKI (acute kidney injury) ([place]) [N17.9] HCAP (healthcare-associated pneumonia) [J18.9] Sepsis, due to unspecified organism ([place]) [A41.9]  DISCHARGE DIAGNOSIS:  Active Problems:   Sepsis ([place])   Status epilepticus ([place])   HCAP (healthcare-associated pneumonia)   Goals of care, counseling/discussion   SECONDARY DIAGNOSIS:   Past Medical History:  Diagnosis Date  . Asthma   . Cirrhosis, non-alcoholic ([place])   . COPD (chronic obstructive pulmonary disease) ([place])   . Deaf   . Depression   . Diabetes mellitus without complication ([place])   . GERD (gastroesophageal reflux disease)   . Heart murmur   . Hepatitis   . Hypertension   . Lymph node disorder    arm  . Neuropathy   . On home oxygen therapy    hs  . Orthopnea   . RLS (restless legs syndrome)   . Seizures ([place])   . Shortness of breath dyspnea   . Sleep apnea   . Stroke [facility])    [place] COURSE:   1.  Acute metabolic encephalopathy with prolonged seizure.  The patient had stopped her seizure medications as outpatient.  EEG showed triphasic waves which is metabolic in nature.  MRI of the brain with and without contrast was negative.  Patient was seen in consultation by neurology and made adjustments in medications. 2.  Acute on chronic respiratory failure with hypoxia.  The patient was intubated and then extubated 04/30/2017 and currently breathing on room air at this time.  Patient was given Maxipime for possible aspiration pneumonia.  This will be stopped prior to disposition. 3.  Seizure disorder.  On  Depakote 750 mg p.o. twice daily and Vimpat 200 mg twice daily.  Compliance with seizure medications was discussed through sign language interpreter 4.  Hypernatremia.  This has improved.  Stop IV fluids with D5.  This was likely since the patient was not eating too much during the first part of her hospital stay. 5.  Fluid overload secondary to IV fluids patient received IV Lasix during the hospital course.  Patient can go back to oral Lasix as outpatient.  Patient will be given 1 more dose of IV Lasix prior to disposition. 6.  History of cirrhosis 7.  Lactic acidosis improved with IV fluids 8.  Acute kidney injury also improved with IV fluids 9.  Hypokalemia and hypomagnesemia.  Replace magnesium upon discharge and potassium was replaced during the hospital course. 10.  Patient is a DNR 11.  Patient will go to rehab today 12.  The patient is able to read lips. 13. type 2 diabetes mellitus.  Hemoglobin A1c 7.4.  Can go back on glipizide 5 mg daily.  Check fingerstick daily and anytime the patient feels funny. 14.  Hyperlipidemia unspecified on Crestor 15 essential hypertension on Lasix and Cardizem 16.  Recommend checking another BMP next week  DISCHARGE CONDITIONS:   Satisfactory  CONSULTS OBTAINED:  Treatment Team:  Catarina Hartshorn, MD Alexis Goodell, MD Corey Skains, MD  DRUG ALLERGIES:   Allergies  Allergen Reactions  . Aspirin Itching  . Celebrex [Celecoxib] Itching  itching  . Ciprofloxacin Itching  . Codeine Itching  . Fosphenytoin Itching  . Levaquin [Levofloxacin In D5w] Itching  . Levofloxacin Itching  . Lovastatin Itching  . Penicillins     Documentation indicates severe reaction  Pt tolerated cephalosporin without adverse reaction 09/18   . Pravastatin Itching  . Sulfa Antibiotics Itching    DISCHARGE MEDICATIONS:   Current Discharge Medication List    START taking these medications   Details  divalproex (DEPAKOTE) 250 MG DR tablet Take 3  tablets (750 mg total) by mouth every 12 (twelve) hours. Qty: 180 tablet, Refills: 0    magnesium oxide (MAG-OX) 400 (241.3 Mg) MG tablet Take 1 tablet (400 mg total) by mouth daily. Qty: 30 tablet, Refills: 0      CONTINUE these medications which have CHANGED   Details  GLIPIZIDE XL 5 MG 24 hr tablet Take 1 tablet (5 mg total) by mouth daily.    lacosamide (VIMPAT) 200 MG TABS tablet Take 1 tablet (200 mg total) by mouth 2 (two) times daily. Qty: 60 tablet, Refills: 0      CONTINUE these medications which have NOT CHANGED   Details  clopidogrel (PLAVIX) 75 MG tablet Take 1 tablet (75 mg total) by mouth daily. Qty: 30 tablet, Refills: 1    diltiazem (DILACOR XR) 180 MG 24 hr capsule Take 180 mg by mouth daily.    furosemide (LASIX) 40 MG tablet Take 1 tablet (40 mg total) by mouth daily. Qty: 30 tablet, Refills: 0    gabapentin (NEURONTIN) 300 MG capsule Take 1 capsule by mouth 2 (two) times daily.  Refills: 1    montelukast (SINGULAIR) 10 MG tablet Take 1 tablet by mouth at bedtime. Refills: 4    omeprazole (PRILOSEC) 20 MG capsule Take 20 mg by mouth daily.    rosuvastatin (CRESTOR) 10 MG tablet Take 10 mg by mouth daily. Refills: 5    albuterol (PROVENTIL HFA;VENTOLIN HFA) 108 (90 BASE) MCG/ACT inhaler Inhale 2 puffs into the lungs every 4 (four) hours as needed for wheezing or shortness of breath.    albuterol (PROVENTIL) (2.5 MG/3ML) 0.083% nebulizer solution Take 3 mLs (2.5 mg total) by nebulization every 4 (four) hours as needed for wheezing or shortness of breath. Qty: 90 mL, Refills: 2    citalopram (CELEXA) 20 MG tablet Take 1 tablet (20 mg total) by mouth daily. Qty: 30 tablet, Refills: 0    mometasone-formoterol (DULERA) 200-5 MCG/ACT AERO Inhale 2 puffs into the lungs 2 (two) times daily. Qty: 1 Inhaler, Refills: 1    rOPINIRole (REQUIP) 0.25 MG tablet Take 1 tablet (0.25 mg total) by mouth 3 (three) times daily. Qty: 90 tablet, Refills: 2      STOP  taking these medications     losartan-hydrochlorothiazide (HYZAAR) 100-12.5 MG tablet      triamcinolone ointment (KENALOG) 0.1 %      clindamycin (CLEOCIN) 300 MG capsule      EPINEPHrine 0.3 mg/0.3 mL IJ SOAJ injection      guaiFENesin-dextromethorphan (ROBITUSSIN DM) 100-10 MG/5ML syrup          DISCHARGE INSTRUCTIONS:   Follow-up with Dr. rehab 1 day  If you experience worsening of your admission symptoms, develop shortness of breath, life threatening emergency, suicidal or homicidal thoughts you must seek medical attention immediately by calling 911 or calling your MD immediately  if symptoms less severe.  You Must read complete instructions/literature along with all the possible adverse reactions/side effects for all the Medicines you  take and that have been prescribed to you. Take any new Medicines after you have completely understood and accept all the possible adverse reactions/side effects.   Please note  You were cared for by a hospitalist during your hospital stay. If you have any questions about your discharge medications or the care you received while you were in the hospital after you are discharged, you can call the unit and asked to speak with the hospitalist on call if the hospitalist that took care of you is not available. Once you are discharged, your primary care physician will handle any further medical issues. Please note that NO REFILLS for any discharge medications will be authorized once you are discharged, as it is imperative that you return to your primary care physician (or establish a relationship with a primary care physician if you do not have one) for your aftercare needs so that they can reassess your need for medications and monitor your lab values.    Today   CHIEF COMPLAINT:   Chief Complaint  Patient presents with  . Respiratory Distress    Hx Seizures, one on EMS     HISTORY OF PRESENT ILLNESS:  [name]  is a 66 y.o. female  presented with respiratory distress, altered mental status with history of seizures   VITAL SIGNS:  Blood pressure (!) 123/46, pulse 69, temperature 98.1 F (36.7 C), temperature source Axillary, resp. rate 16, height _0  (1.575 m), weight 77.7 kg (171 lb 3.2 oz), SpO2 93 %.   PHYSICAL EXAMINATION:  GENERAL:  66 y.o.-year-old patient lying in the bed with no acute distress.  EYES: Pupils equal, round, reactive to light and accommodation. No scleral icterus. Extraocular muscles intact.  HEENT: Head atraumatic, normocephalic. Oropharynx and nasopharynx clear.  NECK:  Supple, no jugular venous distention. No thyroid enlargement, no tenderness.  LUNGS: Decreased breath sounds bilateral bases, no wheezing, rales,or crepitation. No use of accessory muscles of respiration.  Some rhonchi at the bases CARDIOVASCULAR: S1, S2 normal. No murmurs, rubs, or gallops.  ABDOMEN: Soft, non-tender, non-distended. Bowel sounds present. No organomegaly or mass.  EXTREMITIES: No pedal edema, cyanosis, or clubbing.  NEUROLOGIC: Cranial nerves II through XII are intact. Muscle strength 5/5 in all extremities. Sensation intact. Gait not checked.  PSYCHIATRIC: The patient is alert and oriented x 3.  SKIN: No obvious rash, lesion, or ulcer.   DATA REVIEW:   CBC  Recent Labs Lab 05/06/17 0451  WBC 4.8  HGB 12.6  HCT 37.8  PLT 172    Chemistries   Recent Labs Lab 05/07/17 0336  NA 141  K 3.9  CL 102  CO2 30  GLUCOSE 157*  BUN 8  CREATININE 0.53  CALCIUM 8.3*  MG 1.7     Microbiology Results  Results for orders placed or performed during the hospital encounter of 04/29/17  Blood culture (routine x 2)     Status: None   Collection Time: 04/29/17 12:38 PM  Result Value Ref Range Status   Specimen Description BLOOD RFOA  Final   Special Requests   Final    BOTTLES DRAWN AEROBIC AND ANAEROBIC Blood Culture adequate volume   Culture NO GROWTH 5 DAYS  Final   Report Status 05/04/2017 FINAL   Final  Blood culture (routine x 2)     Status: None   Collection Time: 04/29/17 12:39 PM  Result Value Ref Range Status   Specimen Description BLOOD RIGHT HAND  Final   Special Requests   Final  BOTTLES DRAWN AEROBIC AND ANAEROBIC Blood Culture adequate volume   Culture NO GROWTH 5 DAYS  Final   Report Status 05/04/2017 FINAL  Final  Culture, respiratory (NON-Expectorated)     Status: None   Collection Time: 04/29/17  3:00 PM  Result Value Ref Range Status   Specimen Description TRACHEAL ASPIRATE  Final   Special Requests NONE  Final   Gram Stain   Final    MODERATE WBC PRESENT, PREDOMINANTLY PMN FEW GRAM POSITIVE COCCI FEW GRAM POSITIVE RODS    Culture   Final    Consistent with normal respiratory flora. Performed at [facility], [place] [address], [place], [place] [address]    Report Status 05/02/2017 FINAL  Final  MRSA PCR Screening     Status: None   Collection Time: 04/29/17  4:00 PM  Result Value Ref Range Status   MRSA by PCR NEGATIVE NEGATIVE Final    Comment:        The GeneXpert MRSA Assay (FDA approved for NASAL specimens only), is one component of a comprehensive MRSA colonization surveillance program. It is not intended to diagnose MRSA infection nor to guide or monitor treatment for MRSA infections.     Management plans discussed with the patient, family and they are in agreement.  CODE STATUS:     Code Status Orders        Start     Ordered   05/04/17 0836  Do not attempt resuscitation (DNR)  Continuous    Question Answer Comment  In the event of cardiac or respiratory ARREST Do not call a "code blue"   In the event of cardiac or respiratory ARREST Do not perform Intubation, CPR, defibrillation or ACLS   In the event of cardiac or respiratory ARREST Use medication by any route, position, wound care, and other measures to relive pain and suffering. May use oxygen, suction and manual treatment of airway obstruction as needed for comfort.       05/04/17 0835    Code Status History    Date Active Date Inactive Code Status Order ID Comments User Context   04/29/2017  7:15 PM 05/04/2017  8:35 AM Full Code [id number]  Angela Adam, RN Inpatient   03/25/2017  2:35 AM 03/30/2017  7:07 PM Full Code [id number]  Hugelmeyer, Ubaldo Glassing, DO Inpatient   02/11/2017  9:31 PM 02/12/2017  4:36 PM Full Code [id number]  Demetrios Loll, MD Inpatient   07/11/2016 11:31 AM 07/17/2016  3:08 PM Partial Code [id number]  Marijo Conception, RN Inpatient   07/11/2016  2:37 AM 07/11/2016 11:31 AM Full Code [id number]  Harvie Bridge, DO Inpatient   03/22/2016 10:44 PM 03/24/2016  7:12 PM Partial Code [id number]  Toy Baker, MD Inpatient   03/29/2015 11:30 AM 03/31/2015  2:21 PM Partial Code [id number]  Loletha Grayer, MD ED      TOTAL TIME TAKING CARE OF THIS PATIENT: 35 minutes.    Loletha Grayer M.D on 05/07/2017 at 12:57 PM  Between 7am to 6pm - Pager - [phone]  After 6pm go to [email or web address] - password [email or web address] [facility]  [phone]  CC: Primary care physician; Cletis Athens, MD

## 2017-05-07 NOTE — Progress Notes (Signed)
Patient is medically stable for D/C to [facility] today. Per [facility] authorization has been received. Per [facility] patient can come today to room 710. RN will call report to RN Yaakov Guthrie at [phone] and arrange EMS for transport. Clinical Education officer, museum (CSW) sent D/C orders to [facility] via [facility]. Patient is aware of above and her son [name] is at bedside and aware of above. CSW contacted patient's daughter Kenney Houseman and made her aware of above. Please reconsult if future social work needs arise. CSW signing off.   [employer], LCSW [phone]

## 2017-05-07 NOTE — Progress Notes (Signed)
[facility] for Electrolyte management Indication: hypokalemia  Allergies  Allergen Reactions  . Aspirin Itching  . Celebrex [Celecoxib] Itching    itching  . Ciprofloxacin Itching  . Codeine Itching  . Fosphenytoin Itching  . Levaquin [Levofloxacin In D5w] Itching  . Levofloxacin Itching  . Lovastatin Itching  . Penicillins     Documentation indicates severe reaction  Pt tolerated cephalosporin without adverse reaction 09/18   . Pravastatin Itching  . Sulfa Antibiotics Itching    Patient Measurements: Height: 5' 2" (157.5 cm) Weight: 171 lb 3.2 oz (77.7 kg) IBW/kg (Calculated) : 50.1  Labs:  Recent Labs  05/05/17 0518 05/06/17 0451 05/07/17 0336  WBC 6.9 4.8  --   HGB 13.4 12.6  --   HCT 40.5 37.8  --   PLT 208 172  --   CREATININE 0.71 0.62 0.53  MG 1.7 1.6* 1.7   Lab Results  Component Value Date   K 3.9 05/07/2017   Estimated Creatinine Clearance: 67.6 mL/min (by C-G formula based on SCr of 0.53 mg/dL).  Assessment: 66 yo F admitted with seizures, now with hypokalemia.  Patient started lasix 40 mg IV q12h on 10/29.  K = 3.9, Mg = 1.7.  Goal of Therapy:  Electrolytes WNL  Plan:  Electrolytes are WNL. No additional supplementation needed at this time. Will recheck electrolytes with AM labs tomorrow.  Lenis Noon, PharmD, BCPS Clinical Pharmacist 05/07/2017

## 2017-05-07 NOTE — Progress Notes (Signed)
Chaplain received a page for pt in Rm152 who wanted to complete Advanced Directive. [place] met pt and son at bedside. Pt reads lips and responds to commands. CH had educated the pt on Oct 30 assisted by a sign interpreter. At the time, pt indicated she will review material with son and contact [place] when ready. Today, [place] reviewed material with pt, contacted a Insurance account manager, gathered 2 witnesses and assisted pt to complete AD. [place] gave original and 2 extra copies to pt and place a copy in pt.'s chart. Pt to be moved to rehab facility this PM.   05/07/17 1400  Clinical Encounter Type  Visited With Patient;Patient and family together  Visit Type Follow-up;Other (Comment)  Referral From Nurse  Consult/Referral To Chaplain  Spiritual Encounters  Spiritual Needs Literature;Other (Comment)  Stress Factors  Patient Stress Factors None identified  Family Stress Factors None identified  Advance Directives (For Healthcare)  Does Patient Have a Medical Advance Directive? Yes  Does patient want to make changes to medical advance directive? Yes (Inpatient - patient requests chaplain consult to change a medical advance directive)  Type of Advance Directive [place];Living will  Copy of [place] in Chart? Yes  Copy of Living Will in Chart? Yes  Would patient like information on creating a medical advance directive? Yes (Inpatient - patient requests chaplain consult to create a medical advance directive)  [place] Directives  Does Patient Have a Mental Health Advance Directive? No  Would patient like information on creating a mental health advance directive? No - Patient declined

## 2017-05-07 NOTE — Progress Notes (Signed)
Physical Therapy Treatment Patient Details Name: [name] MRN: [id number] DOB: 10-Nov-1950 Today's Date: 05/07/2017    History of Present Illness 66yo female pt presented to ER secondary to SOB with recurrent seizures (5-10) in route to hospital via EMS; admitted with acute/chronic respiratory failure and sepsis related to HCAP.  Intubated 10/24-10/25 for airway protection; now weaned to 4L supplemental O2 via [place].     PT Comments    [name] was eager to work with PT.  She was limited with strengthening exercises due to medial L knee pain that began today (RN notified).  She requires up to min assist with all balance exercises and with short distance ambulation.     Follow Up Recommendations  SNF     Equipment Recommendations       Recommendations for Other Services       Precautions / Restrictions Precautions Precautions: Fall Restrictions Weight Bearing Restrictions: No    Mobility  Bed Mobility Overal bed mobility: Modified Independent             General bed mobility comments: Increased time and effort  Transfers Overall transfer level: Needs assistance Equipment used: None Transfers: Sit to/from Stand Sit to Stand: Min assist         General transfer comment: Min assist to steady as pt demonstrates instability with sit>stand.  She reports medial L knee pain with repeated therapeutic exercise sit<>stand  Ambulation/Gait Ambulation/Gait assistance: Min assist Ambulation Distance (Feet): 12 Feet Assistive device: None Gait Pattern/deviations: Staggering right;Staggering left Gait velocity: decreased Gait velocity interpretation: Below normal speed for age/gender General Gait Details: Pt with narrow base of support and with intermittent stumbling to L or R due to instability, requiring min assist to steady.    Stairs            Wheelchair Mobility    Modified Rankin (Stroke Patients Only)       Balance Overall balance assessment:  Needs assistance Sitting-balance support: No upper extremity supported;Feet supported Sitting balance-Leahy Scale: Normal     Standing balance support: No upper extremity supported;During functional activity Standing balance-Leahy Scale: Poor Standing balance comment: Pt sways with static standing and requires close min guard assist                            Cognition Arousal/Alertness: Awake/alert Behavior During Therapy: WFL for tasks assessed/performed Overall Cognitive Status: Within Functional Limits for tasks assessed                                        Exercises General Exercises - Lower Extremity Mini-Sqauts: Strengthening;Both;Other reps (comment) (pt reports medial L knee pain after 9 reps) Other Exercises Other Exercises: Sit<>stand from bed with 1UE pushing from bed and min assist due to instability.  Discontinued after 4 reps due to reported medial L knee pain.  Other Exercises: Trendelenberg x30 seconds each side with up to min assist due to instability. Other Exercises: Marching in place with BUE supported on countertop.  x10 each LE.  Other Exercises: Static standing with eyes closed x45 seconds with intermittent min assist due to postural sway and instability.     General Comments General comments (skin integrity, edema, etc.): Son in room during PT session      Pertinent Vitals/Pain Pain Assessment: Faces Faces Pain Scale: Hurts little more Pain Location: L knee  and R upper arm/R side of chest Pain Descriptors / Indicators: Discomfort Pain Intervention(s): Limited activity within patient's tolerance;Monitored during session    Home Living                      Prior Function            PT Goals (current goals can now be found in the care plan section) Acute Rehab PT Goals Patient Stated Goal: to get stronger PT Goal Formulation: With patient Time For Goal Achievement: 05/17/17 Potential to Achieve Goals:  Good Progress towards PT goals: Progressing toward goals    Frequency    Min 2X/week      PT Plan Current plan remains appropriate    Co-evaluation              AM-PAC PT "6 Clicks" Daily Activity  Outcome Measure  Difficulty turning over in bed (including adjusting bedclothes, sheets and blankets)?: None Difficulty moving from lying on back to sitting on the side of the bed? : None Difficulty sitting down on and standing up from a chair with arms (e.g., wheelchair, bedside commode, etc,.)?: A Lot Help needed moving to and from a bed to chair (including a wheelchair)?: A Little Help needed walking in hospital room?: A Little Help needed climbing 3-5 steps with a railing? : A Little 6 Click Score: 19    End of Session Equipment Utilized During Treatment: Gait belt Activity Tolerance: Patient tolerated treatment well;Patient limited by fatigue Patient left: in bed;with call bell/phone within reach;with family/visitor present;Other (comment) (sitting EOB) Nurse Communication: Mobility status;Other (comment) (reports of pain) PT Visit Diagnosis: Difficulty in walking, not elsewhere classified (R26.2);Muscle weakness (generalized) (M62.81);Unsteadiness on feet (R26.81)     Time: 6811-5726 PT Time Calculation (min) (ACUTE ONLY): 25 min  Charges:  $Therapeutic Exercise: 8-22 mins                    G Codes:       [name] PT, DPT 05/07/2017, 4:00 PM

## 2017-05-07 NOTE — Care Management Important Message (Signed)
Important Message  Patient Details  Name: [name] MRN: [id number] Date of Birth: 05-Jan-1951   Medicare Important Message Given:  Yes    Jolly Mango, RN 05/07/2017, 2:04 PM

## 2017-05-09 DIAGNOSIS — J449 Chronic obstructive pulmonary disease, unspecified: Secondary | ICD-10-CM | POA: Diagnosis not present

## 2017-05-09 DIAGNOSIS — R296 Repeated falls: Secondary | ICD-10-CM | POA: Diagnosis not present

## 2017-05-09 DIAGNOSIS — Z7902 Long term (current) use of antithrombotics/antiplatelets: Secondary | ICD-10-CM | POA: Diagnosis not present

## 2017-05-09 DIAGNOSIS — Z7984 Long term (current) use of oral hypoglycemic drugs: Secondary | ICD-10-CM | POA: Diagnosis not present

## 2017-05-09 DIAGNOSIS — M6281 Muscle weakness (generalized): Secondary | ICD-10-CM | POA: Diagnosis not present

## 2017-05-09 DIAGNOSIS — J45909 Unspecified asthma, uncomplicated: Secondary | ICD-10-CM | POA: Diagnosis not present

## 2017-05-09 DIAGNOSIS — Z8673 Personal history of transient ischemic attack (TIA), and cerebral infarction without residual deficits: Secondary | ICD-10-CM | POA: Diagnosis not present

## 2017-05-09 DIAGNOSIS — R569 Unspecified convulsions: Secondary | ICD-10-CM | POA: Diagnosis not present

## 2017-05-09 DIAGNOSIS — E119 Type 2 diabetes mellitus without complications: Secondary | ICD-10-CM | POA: Diagnosis not present

## 2017-05-18 ENCOUNTER — Other Ambulatory Visit: Payer: Self-pay | Admitting: *Deleted

## 2017-05-18 DIAGNOSIS — Z7984 Long term (current) use of oral hypoglycemic drugs: Secondary | ICD-10-CM | POA: Diagnosis not present

## 2017-05-18 DIAGNOSIS — E119 Type 2 diabetes mellitus without complications: Secondary | ICD-10-CM | POA: Diagnosis not present

## 2017-05-18 DIAGNOSIS — Z7902 Long term (current) use of antithrombotics/antiplatelets: Secondary | ICD-10-CM | POA: Diagnosis not present

## 2017-05-18 DIAGNOSIS — K219 Gastro-esophageal reflux disease without esophagitis: Secondary | ICD-10-CM | POA: Diagnosis not present

## 2017-05-18 DIAGNOSIS — J189 Pneumonia, unspecified organism: Secondary | ICD-10-CM | POA: Diagnosis not present

## 2017-05-18 DIAGNOSIS — J45909 Unspecified asthma, uncomplicated: Secondary | ICD-10-CM | POA: Diagnosis not present

## 2017-05-18 DIAGNOSIS — Z8673 Personal history of transient ischemic attack (TIA), and cerebral infarction without residual deficits: Secondary | ICD-10-CM | POA: Diagnosis not present

## 2017-05-18 DIAGNOSIS — J449 Chronic obstructive pulmonary disease, unspecified: Secondary | ICD-10-CM | POA: Diagnosis not present

## 2017-05-18 DIAGNOSIS — R569 Unspecified convulsions: Secondary | ICD-10-CM | POA: Diagnosis not present

## 2017-05-18 NOTE — Patient Outreach (Signed)
[facility] Care Management  05/18/2017  [name] 11/11/1950 [id number]   Attempted to meet with patient at facility. She was out of facility on pass, she left with daughter to go to her home to pick up some items.  RNCM left THN packet in room.   Spoke with [name], SW at facility. He reports patient will discharge 11/15 with home care.   Plan to attempt to speak with patient or family by phone.  Patient has history of COPD and DM, has had several ED and Hospitalizations over the past year.   [name]. Laymond Purser, RN, BSN, [place] [phone]) Business Cell  [phone]) Toll Free Office

## 2017-05-22 ENCOUNTER — Emergency Department: Payer: Medicare HMO

## 2017-05-22 ENCOUNTER — Emergency Department
Admission: EM | Admit: 2017-05-22 | Discharge: 2017-05-22 | Disposition: A | Discharge status: Home / Self Care | Payer: Medicare HMO | Attending: Emergency Medicine | Admitting: Emergency Medicine

## 2017-05-22 ENCOUNTER — Encounter: Payer: Self-pay | Admitting: *Deleted

## 2017-05-22 ENCOUNTER — Other Ambulatory Visit: Payer: Self-pay

## 2017-05-22 DIAGNOSIS — M150 Primary generalized (osteo)arthritis: Secondary | ICD-10-CM | POA: Diagnosis not present

## 2017-05-22 DIAGNOSIS — S0990XA Unspecified injury of head, initial encounter: Secondary | ICD-10-CM | POA: Diagnosis not present

## 2017-05-22 DIAGNOSIS — R400 Somnolence: Secondary | ICD-10-CM | POA: Insufficient documentation

## 2017-05-22 DIAGNOSIS — J449 Chronic obstructive pulmonary disease, unspecified: Secondary | ICD-10-CM | POA: Diagnosis not present

## 2017-05-22 DIAGNOSIS — T426X5A Adverse effect of other antiepileptic and sedative-hypnotic drugs, initial encounter: Secondary | ICD-10-CM | POA: Diagnosis not present

## 2017-05-22 DIAGNOSIS — Z7984 Long term (current) use of oral hypoglycemic drugs: Secondary | ICD-10-CM | POA: Diagnosis not present

## 2017-05-22 DIAGNOSIS — Z8673 Personal history of transient ischemic attack (TIA), and cerebral infarction without residual deficits: Secondary | ICD-10-CM | POA: Diagnosis not present

## 2017-05-22 DIAGNOSIS — J45909 Unspecified asthma, uncomplicated: Secondary | ICD-10-CM | POA: Insufficient documentation

## 2017-05-22 DIAGNOSIS — F17200 Nicotine dependence, unspecified, uncomplicated: Secondary | ICD-10-CM | POA: Diagnosis not present

## 2017-05-22 DIAGNOSIS — Z7902 Long term (current) use of antithrombotics/antiplatelets: Secondary | ICD-10-CM | POA: Diagnosis not present

## 2017-05-22 DIAGNOSIS — Z79899 Other long term (current) drug therapy: Secondary | ICD-10-CM | POA: Diagnosis not present

## 2017-05-22 DIAGNOSIS — I10 Essential (primary) hypertension: Secondary | ICD-10-CM | POA: Diagnosis not present

## 2017-05-22 DIAGNOSIS — W19XXXA Unspecified fall, initial encounter: Secondary | ICD-10-CM

## 2017-05-22 DIAGNOSIS — T887XXA Unspecified adverse effect of drug or medicament, initial encounter: Secondary | ICD-10-CM

## 2017-05-22 DIAGNOSIS — E119 Type 2 diabetes mellitus without complications: Secondary | ICD-10-CM | POA: Diagnosis not present

## 2017-05-22 DIAGNOSIS — R918 Other nonspecific abnormal finding of lung field: Secondary | ICD-10-CM | POA: Diagnosis not present

## 2017-05-22 DIAGNOSIS — W07XXXA Fall from chair, initial encounter: Secondary | ICD-10-CM | POA: Diagnosis not present

## 2017-05-22 DIAGNOSIS — M069 Rheumatoid arthritis, unspecified: Secondary | ICD-10-CM | POA: Diagnosis not present

## 2017-05-22 DIAGNOSIS — R51 Headache: Secondary | ICD-10-CM | POA: Diagnosis not present

## 2017-05-22 DIAGNOSIS — G40909 Epilepsy, unspecified, not intractable, without status epilepticus: Secondary | ICD-10-CM | POA: Diagnosis not present

## 2017-05-22 LAB — CBC
HCT: 42.8 % (ref 35.0–47.0)
HEMOGLOBIN: 14.5 g/dL (ref 12.0–16.0)
MCH: 28.8 pg (ref 26.0–34.0)
MCHC: 33.8 g/dL (ref 32.0–36.0)
MCV: 85 fL (ref 80.0–100.0)
PLATELETS: 243 10*3/uL (ref 150–440)
RBC: 5.04 MIL/uL (ref 3.80–5.20)
RDW: 13.6 % (ref 11.5–14.5)
WBC: 7 10*3/uL (ref 3.6–11.0)

## 2017-05-22 LAB — COMPREHENSIVE METABOLIC PANEL
ALK PHOS: 177 U/L — AB (ref 38–126)
ALT: 16 U/L (ref 14–54)
ANION GAP: 11 (ref 5–15)
AST: 39 U/L (ref 15–41)
Albumin: 3.5 g/dL (ref 3.5–5.0)
BUN: 10 mg/dL (ref 6–20)
CALCIUM: 9 mg/dL (ref 8.9–10.3)
CO2: 25 mmol/L (ref 22–32)
CREATININE: 0.62 mg/dL (ref 0.44–1.00)
Chloride: 101 mmol/L (ref 101–111)
GFR calc non Af Amer: >60 mL/min (ref >60)
Glucose, Bld: 179 mg/dL — ABNORMAL HIGH (ref 65–99)
Potassium: 4.1 mmol/L (ref 3.5–5.1)
SODIUM: 137 mmol/L (ref 135–145)
Total Bilirubin: 0.6 mg/dL (ref 0.3–1.2)
Total Protein: 7 g/dL (ref 6.5–8.1)

## 2017-05-22 LAB — TROPONIN I

## 2017-05-22 NOTE — ED Provider Notes (Signed)
[facility] Emergency Department Provider Note   ____________________________________________    I have reviewed the triage vital signs and the nursing notes.   HISTORY  Chief Complaint Fall and drowsiness   HPI [name] is a 66 y.o. female with a history of COPD diabetes hypertension seizure disorder and deafness who presents for evaluation after a fall.  Patient is able to read lips.  Daughter is also able to sign.  They state they did not need an interpreter.  Daughter reports the patient has been drowsy, she attributes this to her medications although does note that her new medications seem to be controlling her seizures.  Recently released from [facility].  Apparently had falls there.  Daughter reports the patient had a fall this morning which was not significant but she is concerned because the patient has a hematoma from a fall at [facility] and no imaging was apparently done   Past Medical History:  Diagnosis Date  . Asthma   . Cirrhosis, non-alcoholic ([place])   . COPD (chronic obstructive pulmonary disease) ([place])   . Deaf   . Depression   . Diabetes mellitus without complication ([place])   . GERD (gastroesophageal reflux disease)   . Heart murmur   . Hepatitis   . Hypertension   . Lymph node disorder    arm  . Neuropathy   . On home oxygen therapy    hs  . Orthopnea   . RLS (restless legs syndrome)   . Seizures ([place])   . Shortness of breath dyspnea   . Sleep apnea   . Stroke [facility])    tia    Patient Active Problem List   Diagnosis Date Noted  . Goals of care, counseling/discussion 05/06/2017  . Status epilepticus ([place])   . HCAP (healthcare-associated pneumonia)   . Community acquired pneumonia of left lower lobe of lung ([place])   . Hypoxia   . Acute on chronic respiratory failure with hypoxia ([place]) 03/24/2017  . Hypokalemia 02/12/2017  . Sepsis ([place]) 02/11/2017  . GERD (gastroesophageal reflux disease) 03/23/2016  .  Depression 03/23/2016  . Convulsions/seizures ([place]) 03/22/2016  . DM (diabetes mellitus), type 2 ([place]) 03/22/2016  . Essential hypertension 03/22/2016  . Neuropathy 03/22/2016  . Convulsions ([place]) 03/22/2016  . COPD exacerbation ([place]) 03/31/2015  . Acute respiratory failure with hypoxia ([place]) 03/29/2015    Past Surgical History:  Procedure Laterality Date  . CATARACT EXTRACTION PHACO AND INTRAOCULAR LENS PLACEMENT (IOC) Left 12/14/2014   Performed by Lyla Glassing, MD at [facility] ORS  . CATARACT EXTRACTION PHACO AND INTRAOCULAR LENS PLACEMENT ([place]) Right 11/23/2014   Performed by Lyla Glassing, MD at [facility] ORS  . CESAREAN SECTION    . CHOLECYSTECTOMY    . KNEE ARTHROPLASTY    . THUMB ARTHROSCOPY    . TONSILLECTOMY    . TYMPANOPLASTY     muliple    Prior to Admission medications   Medication Sig Start Date End Date Taking? Authorizing Provider  albuterol (PROVENTIL HFA;VENTOLIN HFA) 108 (90 BASE) MCG/ACT inhaler Inhale 2 puffs into the lungs every 4 (four) hours as needed for wheezing or shortness of breath.    [provider]  albuterol (PROVENTIL) (2.5 MG/3ML) 0.083% nebulizer solution Take 3 mLs (2.5 mg total) by nebulization every 4 (four) hours as needed for wheezing or shortness of breath. Patient not taking: Reported on 04/29/2017 07/17/16   Vaughan Basta, MD  citalopram (CELEXA) 20 MG tablet Take 1 tablet (20 mg  total) by mouth daily. Patient not taking: Reported on 04/29/2017 07/17/16   Vaughan Basta, MD  clopidogrel (PLAVIX) 75 MG tablet Take 1 tablet (75 mg total) by mouth daily. 07/17/16   Vaughan Basta, MD  diltiazem (DILACOR XR) 180 MG 24 hr capsule Take 180 mg by mouth daily.    [provider]  divalproex (DEPAKOTE) 250 MG DR tablet Take 3 tablets (750 mg total) by mouth every 12 (twelve) hours. 05/07/17   Loletha Grayer, MD  furosemide (LASIX) 40 MG tablet Take 1 tablet (40 mg total) by mouth daily. Patient taking  differently: Take 20 mg by mouth daily.  07/17/16   Vaughan Basta, MD  gabapentin (NEURONTIN) 300 MG capsule Take 1 capsule by mouth 2 (two) times daily.  01/16/16   [provider]  GLIPIZIDE XL 5 MG 24 hr tablet Take 1 tablet (5 mg total) by mouth daily. 05/07/17   Loletha Grayer, MD  lacosamide (VIMPAT) 200 MG TABS tablet Take 1 tablet (200 mg total) by mouth 2 (two) times daily. 05/07/17   Loletha Grayer, MD  magnesium oxide (MAG-OX) 400 (241.3 Mg) MG tablet Take 1 tablet (400 mg total) by mouth daily. 05/07/17   Loletha Grayer, MD  mometasone-formoterol (DULERA) 200-5 MCG/ACT AERO Inhale 2 puffs into the lungs 2 (two) times daily. 07/17/16   Vaughan Basta, MD  montelukast (SINGULAIR) 10 MG tablet Take 1 tablet by mouth at bedtime. 03/18/16   [provider]  omeprazole (PRILOSEC) 20 MG capsule Take 20 mg by mouth daily.    [provider]  rOPINIRole (REQUIP) 0.25 MG tablet Take 1 tablet (0.25 mg total) by mouth 3 (three) times daily. 03/24/16   Oswald Hillock, MD  rosuvastatin (CRESTOR) 10 MG tablet Take 10 mg by mouth daily. 04/21/17   [provider]     Allergies Aspirin; Celebrex [celecoxib]; Ciprofloxacin; Codeine; Fosphenytoin; Levaquin [levofloxacin in d5w]; Levofloxacin; Lovastatin; Penicillins; Pravastatin; and Sulfa antibiotics  Family History  Problem Relation Age of Onset  . Lung cancer Mother   . CAD Father     Social History Social History   Tobacco Use  . Smoking status: Current Every Day Smoker    Packs/day: 0.50  . Smokeless tobacco: Never Used  Substance Use Topics  . Alcohol use: No  . Drug use: No    Review of Systems  Constitutional: No dizziness Eyes: No visual changes.  ENT: No neck pain Cardiovascular: Denies chest pain. Respiratory: Pain with deep breathing secondary to bruise Gastrointestinal: No nausea, no vomiting.   Genitourinary: Negative for dysuria. Musculoskeletal: Negative for back  pain. Skin: Positive for bruises Neurological: Negative for new weakness   ____________________________________________   PHYSICAL EXAM:  VITAL SIGNS: ED Triage Vitals [05/22/17 1146]  Enc Vitals Group     BP (!) 155/64     Pulse Rate (!) 57     Resp 16     Temp 98.5 F (36.9 C)     Temp Source Oral     SpO2 95 %     Weight 77.6 kg (171 lb)     Height 1.575 m (_0 )     Head Circumference      [facility] Flow      Pain Score      Pain Loc      Pain Edu?      Excl. in [place]?     Constitutional: Alert and oriented. No acute distress. Pleasant and interactive Eyes: Conjunctivae are normal.  Head: Hematoma noted, no  bleeding Nose: No congestion/rhinnorhea. Mouth/Throat: Mucous membranes are moist.   Neck:  Painless ROM no vertebral tenderness palpation  Cardiovascular: Normal rate, regular rhythm.  Good peripheral circulation.  Bruising to the left chest wall bony a normalities respiratory: Normal respiratory effort.  No retractions. Lungs CTAB. Gastrointestinal: Soft and nontender. No distention.  No CVA tenderness. Genitourinary: deferred Musculoskeletal: Warm and well perfused, scattered mild bruises Neurologic:  Normal speech and language. No gross focal neurologic deficits are appreciated.  Skin:  Skin is warm, dry and intact.  Psychiatric: Mood and affect are normal. Speech and behavior are normal.  ____________________________________________   LABS (all labs ordered are listed, but only abnormal results are displayed)  Labs Reviewed  COMPREHENSIVE METABOLIC PANEL - Abnormal; Notable for the following components:      Result Value   Glucose, Bld 179 (*)    Alkaline Phosphatase 177 (*)    All other components within normal limits  CBC  TROPONIN I  CBG MONITORING, ED   ____________________________________________  EKG  ED ECG REPORT I, Lavonia Drafts, the attending physician, personally viewed and interpreted this ECG.  Date: 05/22/2017  Rhythm: normal  sinus rhythm QRS Axis: normal Intervals: normal ST/T Wave abnormalities: T wave inversions, chronic   ____________________________________________  RADIOLOGY  CT head no acute distress, chest x-ray no acute distress ____________________________________________   PROCEDURES  Procedure(s) performed: No  Procedures   Critical Care performed: No ____________________________________________   INITIAL IMPRESSION / ASSESSMENT AND PLAN / ED COURSE  Pertinent labs & imaging results that were available during my care of the patient were reviewed by me and considered in my medical decision making (see chart for details).  Patient presents after fall, overall she is well-appearing and in no acute distress.  Daughter reports she is quite drowsy especially with the gabapentin and Vimpat together however her seizures do appear to be controlled.  Daughter attributes the drowsiness to her frequent falls which does seem likely.  Workup is reassuring, patient feels well.  We will decrease gabapentin, patient has outpatient follow-up with PCP in 2 days    ____________________________________________   FINAL CLINICAL IMPRESSION(S) / ED DIAGNOSES  Final diagnoses:  Fall, initial encounter  Medication side effect        Note:  This document was prepared using Dragon voice recognition software and may include unintentional dictation errors.    Lavonia Drafts, MD 05/22/17 [phone]

## 2017-05-22 NOTE — ED Notes (Signed)
PT was helped into wheelchair by RN but was able to stand independently. Pt is now responsive and in NAD. Pt and daughter verbalized feeling safe taking patient back home and daughter reports she is able to stay with pt until alternative options are available.

## 2017-05-22 NOTE — Discharge Instructions (Signed)
Please decrease your gabapentin as we discussed. One tablet at night

## 2017-05-22 NOTE — ED Triage Notes (Signed)
Pt to ED after a witnessed fall at home. Pt was just discharged from [facility] yesterday to home where pt lives alone. Pt was reported to have been recently changed to Vimpat for her seizures but pt has had increased drowsiness, difficulty focusing and decreased balance since being placed on medication. Pt has brusing on back and chin from old fall that pt was not seen in ED for. Pts daughter reports pt has fallen a total of three times while at [facility] with one known fall hitting head.   Today pt has new brusing noted to left ribs. Pain when taking deep breath.Pt also reports head pain at this time. Daugther reports patient has had increased drowsiness and difficulty focusing since starting medication.   [facility] as [facility]. Pt had oxygen saturation in the 80s on RA. EMS placed pt on 3L during transport.

## 2017-05-22 NOTE — ED Triage Notes (Signed)
PT IS DEAF. Daughter is refusing the use of a video interpreter and does not want to translate herself due to "being shaky." RN informed daughter that there is no ive ASL interpreter at [facility]. Pts daughter reports she will call an interpreter in [place] that she knows.

## 2017-05-23 DIAGNOSIS — M069 Rheumatoid arthritis, unspecified: Secondary | ICD-10-CM | POA: Diagnosis not present

## 2017-05-23 DIAGNOSIS — J449 Chronic obstructive pulmonary disease, unspecified: Secondary | ICD-10-CM | POA: Diagnosis not present

## 2017-05-23 DIAGNOSIS — I10 Essential (primary) hypertension: Secondary | ICD-10-CM | POA: Diagnosis not present

## 2017-05-23 DIAGNOSIS — M150 Primary generalized (osteo)arthritis: Secondary | ICD-10-CM | POA: Diagnosis not present

## 2017-05-23 DIAGNOSIS — G40909 Epilepsy, unspecified, not intractable, without status epilepticus: Secondary | ICD-10-CM | POA: Diagnosis not present

## 2017-05-23 DIAGNOSIS — E119 Type 2 diabetes mellitus without complications: Secondary | ICD-10-CM | POA: Diagnosis not present

## 2017-05-25 ENCOUNTER — Emergency Department
Admission: EM | Admit: 2017-05-25 | Discharge: 2017-05-25 | Disposition: A | Discharge status: Home / Self Care | Payer: Medicare HMO | Attending: Emergency Medicine | Admitting: Emergency Medicine

## 2017-05-25 DIAGNOSIS — Z7902 Long term (current) use of antithrombotics/antiplatelets: Secondary | ICD-10-CM | POA: Insufficient documentation

## 2017-05-25 DIAGNOSIS — I10 Essential (primary) hypertension: Secondary | ICD-10-CM | POA: Diagnosis not present

## 2017-05-25 DIAGNOSIS — E119 Type 2 diabetes mellitus without complications: Secondary | ICD-10-CM | POA: Diagnosis not present

## 2017-05-25 DIAGNOSIS — R112 Nausea with vomiting, unspecified: Secondary | ICD-10-CM | POA: Diagnosis not present

## 2017-05-25 DIAGNOSIS — Z8673 Personal history of transient ischemic attack (TIA), and cerebral infarction without residual deficits: Secondary | ICD-10-CM | POA: Diagnosis not present

## 2017-05-25 DIAGNOSIS — Z79899 Other long term (current) drug therapy: Secondary | ICD-10-CM | POA: Diagnosis not present

## 2017-05-25 DIAGNOSIS — J45909 Unspecified asthma, uncomplicated: Secondary | ICD-10-CM | POA: Insufficient documentation

## 2017-05-25 DIAGNOSIS — R569 Unspecified convulsions: Secondary | ICD-10-CM | POA: Insufficient documentation

## 2017-05-25 DIAGNOSIS — J449 Chronic obstructive pulmonary disease, unspecified: Secondary | ICD-10-CM | POA: Insufficient documentation

## 2017-05-25 DIAGNOSIS — F17200 Nicotine dependence, unspecified, uncomplicated: Secondary | ICD-10-CM | POA: Insufficient documentation

## 2017-05-25 LAB — CBC WITH DIFFERENTIAL/PLATELET
BASOS ABS: 0 10*3/uL (ref 0–0.1)
BASOS PCT: 0 %
Eosinophils Absolute: 0 10*3/uL (ref 0–0.7)
Eosinophils Relative: 0 %
HEMATOCRIT: 41.8 % (ref 35.0–47.0)
HEMOGLOBIN: 14.4 g/dL (ref 12.0–16.0)
Lymphocytes Relative: 11 %
Lymphs Abs: 0.8 10*3/uL — ABNORMAL LOW (ref 1.0–3.6)
MCH: 29.1 pg (ref 26.0–34.0)
MCHC: 34.4 g/dL (ref 32.0–36.0)
MCV: 84.5 fL (ref 80.0–100.0)
MONOS PCT: 9 %
Monocytes Absolute: 0.7 10*3/uL (ref 0.2–0.9)
NEUTROS ABS: 6 10*3/uL (ref 1.4–6.5)
NEUTROS PCT: 80 %
Platelets: 234 10*3/uL (ref 150–440)
RBC: 4.95 MIL/uL (ref 3.80–5.20)
RDW: 14.1 % (ref 11.5–14.5)
WBC: 7.5 10*3/uL (ref 3.6–11.0)

## 2017-05-25 LAB — BASIC METABOLIC PANEL
Anion gap: 14 (ref 5–15)
BUN: 12 mg/dL (ref 6–20)
CALCIUM: 8.8 mg/dL — AB (ref 8.9–10.3)
CHLORIDE: 102 mmol/L (ref 101–111)
CO2: 24 mmol/L (ref 22–32)
CREATININE: 0.67 mg/dL (ref 0.44–1.00)
GFR calc non Af Amer: >60 mL/min (ref >60)
Glucose, Bld: 221 mg/dL — ABNORMAL HIGH (ref 65–99)
Potassium: 4.5 mmol/L (ref 3.5–5.1)
SODIUM: 140 mmol/L (ref 135–145)

## 2017-05-25 LAB — VALPROIC ACID LEVEL: VALPROIC ACID LVL: 100 ug/mL (ref 50.0–100.0)

## 2017-05-25 LAB — AMMONIA: AMMONIA: 25 umol/L (ref 9–35)

## 2017-05-25 MED ORDER — VALPROATE SODIUM 500 MG/5ML IV SOLN
1.0000 g | Freq: Once | INTRAVENOUS | Status: AC
  Administered 2017-05-25: 1000 mg via INTRAVENOUS
  Filled 2017-05-25: qty 10

## 2017-05-25 MED ORDER — LEVETIRACETAM 250 MG PO TABS: 250.0000 mg | ORAL_TABLET | Freq: Two times a day (BID) | ORAL | 0 refills | Status: DC

## 2017-05-25 MED ORDER — SODIUM CHLORIDE 0.9 % IV BOLUS (SEPSIS)
1000.0000 mL | Freq: Once | INTRAVENOUS | Status: AC
  Administered 2017-05-25: 1000 mL via INTRAVENOUS

## 2017-05-25 MED ORDER — ONDANSETRON HCL 4 MG/2ML IJ SOLN
4.0000 mg | Freq: Once | INTRAMUSCULAR | Status: AC
  Administered 2017-05-25: 4 mg via INTRAVENOUS
  Filled 2017-05-25: qty 2

## 2017-05-25 NOTE — ED Provider Notes (Addendum)
[facility] Emergency Department Provider Note  ____________________________________________   First MD Initiated Contact with Patient 05/25/17 1117     (approximate)  I have reviewed the triage vital signs and the nursing notes.   HISTORY  Chief Complaint Nausea; Emesis; and Seizures   HPI [name] is a 66 y.o. female with a history of nonalcoholic cirrhosis as well as deafness and seizures who is presenting to the emergency department today with nausea vomiting and a seizure x1.  Per EMS, the patient sees with rolling back of her eyes for about 45 seconds.  Prior to the patient seizing the patient, with fire rescue, was signing and awake and alert.  However, since her seizures she has been with only eye opening to sternal rub.  Medics found her glucose to be above 200.  The daughter is at the bedside and says that the patient had multiple episodes of nausea vomiting this morning.  She does not report diarrhea.  Says that the patient said that she was not feeling well before vomiting this morning and said that she needed to lie down.  The patient's daughter reports eye twitching ever since starting Vimpat several weeks ago and thinks that the patient is poorly tolerating this medication.  The patient was previously on Keppra but had difficulty affording this medication.  The daughter says that the patient does have any postictal episode after seizing and usually takes several hours to regain her baseline mental status.  She says that the patient also had a fall last week and was seen in the emergency department and had a scan of her head which did not reveal any acute intracranial abnormalities.  Patient's daughter also said that the patient has been compliant with her seizure medications.   Past Medical History:  Diagnosis Date  . Asthma   . Cirrhosis, non-alcoholic ([place])   . COPD (chronic obstructive pulmonary disease) ([place])   . Deaf   . Depression   .  Diabetes mellitus without complication ([place])   . GERD (gastroesophageal reflux disease)   . Heart murmur   . Hepatitis   . Hypertension   . Lymph node disorder    arm  . Neuropathy   . On home oxygen therapy    hs  . Orthopnea   . RLS (restless legs syndrome)   . Seizures ([place])   . Shortness of breath dyspnea   . Sleep apnea   . Stroke [facility])    tia    Patient Active Problem List   Diagnosis Date Noted  . Goals of care, counseling/discussion 05/06/2017  . Status epilepticus ([place])   . HCAP (healthcare-associated pneumonia)   . Community acquired pneumonia of left lower lobe of lung ([place])   . Hypoxia   . Acute on chronic respiratory failure with hypoxia ([place]) 03/24/2017  . Hypokalemia 02/12/2017  . Sepsis ([place]) 02/11/2017  . GERD (gastroesophageal reflux disease) 03/23/2016  . Depression 03/23/2016  . Convulsions/seizures ([place]) 03/22/2016  . DM (diabetes mellitus), type 2 ([place]) 03/22/2016  . Essential hypertension 03/22/2016  . Neuropathy 03/22/2016  . Convulsions ([place]) 03/22/2016  . COPD exacerbation ([place]) 03/31/2015  . Acute respiratory failure with hypoxia ([place]) 03/29/2015    Past Surgical History:  Procedure Laterality Date  . CATARACT EXTRACTION PHACO AND INTRAOCULAR LENS PLACEMENT (IOC) Left 12/14/2014   Performed by Lyla Glassing, MD at [facility] ORS  . CATARACT EXTRACTION PHACO AND INTRAOCULAR LENS PLACEMENT ([place]) Right 11/23/2014   Performed by Lyla Glassing, MD  at [facility] ORS  . CESAREAN SECTION    . CHOLECYSTECTOMY    . KNEE ARTHROPLASTY    . THUMB ARTHROSCOPY    . TONSILLECTOMY    . TYMPANOPLASTY     muliple    Prior to Admission medications   Medication Sig Start Date End Date Taking? Authorizing Provider  albuterol (PROVENTIL HFA;VENTOLIN HFA) 108 (90 BASE) MCG/ACT inhaler Inhale 2 puffs into the lungs every 4 (four) hours as needed for wheezing or shortness of breath.    [provider]  albuterol (PROVENTIL) (2.5 MG/3ML) 0.083% nebulizer  solution Take 3 mLs (2.5 mg total) by nebulization every 4 (four) hours as needed for wheezing or shortness of breath. Patient not taking: Reported on 04/29/2017 07/17/16   Vaughan Basta, MD  citalopram (CELEXA) 20 MG tablet Take 1 tablet (20 mg total) by mouth daily. Patient not taking: Reported on 04/29/2017 07/17/16   Vaughan Basta, MD  clopidogrel (PLAVIX) 75 MG tablet Take 1 tablet (75 mg total) by mouth daily. 07/17/16   Vaughan Basta, MD  diltiazem (DILACOR XR) 180 MG 24 hr capsule Take 180 mg by mouth daily.    [provider]  divalproex (DEPAKOTE) 250 MG DR tablet Take 3 tablets (750 mg total) by mouth every 12 (twelve) hours. 05/07/17   Loletha Grayer, MD  furosemide (LASIX) 40 MG tablet Take 1 tablet (40 mg total) by mouth daily. Patient taking differently: Take 20 mg by mouth daily.  07/17/16   Vaughan Basta, MD  gabapentin (NEURONTIN) 300 MG capsule Take 1 capsule by mouth 2 (two) times daily.  01/16/16   [provider]  GLIPIZIDE XL 5 MG 24 hr tablet Take 1 tablet (5 mg total) by mouth daily. 05/07/17   Loletha Grayer, MD  lacosamide (VIMPAT) 200 MG TABS tablet Take 1 tablet (200 mg total) by mouth 2 (two) times daily. 05/07/17   Loletha Grayer, MD  magnesium oxide (MAG-OX) 400 (241.3 Mg) MG tablet Take 1 tablet (400 mg total) by mouth daily. 05/07/17   Loletha Grayer, MD  mometasone-formoterol (DULERA) 200-5 MCG/ACT AERO Inhale 2 puffs into the lungs 2 (two) times daily. 07/17/16   Vaughan Basta, MD  montelukast (SINGULAIR) 10 MG tablet Take 1 tablet by mouth at bedtime. 03/18/16   [provider]  omeprazole (PRILOSEC) 20 MG capsule Take 20 mg by mouth daily.    [provider]  rOPINIRole (REQUIP) 0.25 MG tablet Take 1 tablet (0.25 mg total) by mouth 3 (three) times daily. 03/24/16   Oswald Hillock, MD  rosuvastatin (CRESTOR) 10 MG tablet Take 10 mg by mouth daily. 04/21/17   [provider]     Allergies Aspirin; Celebrex [celecoxib]; Ciprofloxacin; Codeine; Fosphenytoin; Levaquin [levofloxacin in d5w]; Levofloxacin; Lovastatin; Penicillins; Pravastatin; and Sulfa antibiotics  Family History  Problem Relation Age of Onset  . Lung cancer Mother   . CAD Father     Social History Social History   Tobacco Use  . Smoking status: Current Every Day Smoker    Packs/day: 0.50  . Smokeless tobacco: Never Used  Substance Use Topics  . Alcohol use: No  . Drug use: No    Review of Systems  Level 5 caveat secondary to the patient being postictal and nonverbal at this time.   ____________________________________________   PHYSICAL EXAM:  VITAL SIGNS: ED Triage Vitals  Enc Vitals Group     BP 05/25/17 1115 (!) 176/67     Pulse Rate 05/25/17 1115 (!) 55  Resp 05/25/17 1115 (!) 23     Temp 05/25/17 1115 (!) 97.4 F (36.3 C)     Temp Source 05/25/17 1115 Axillary     SpO2 05/25/17 1114 94 %     Weight 05/25/17 1116 171 lb (77.6 kg)     Height 05/25/17 1116 _0  (1.575 m)     Head Circumference --      [facility] Flow --      Pain Score --      Pain Loc --      Pain Edu? --      Excl. in [place]? --     Constitutional: Patient with eye opening to sternal rub. Eyes: Conjunctivae are normal.  Head: Atraumatic. Nose: No congestion/rhinnorhea. Mouth/Throat: Mucous membranes are moist.  Neck: No stridor.   Cardiovascular: Bradycardic, regular rhythm. Grossly normal heart sounds.   Respiratory: Normal respiratory effort.  No retractions. Lungs CTAB. Gastrointestinal: Soft. No distention.  Musculoskeletal: No lower extremity tenderness nor edema.  No joint effusions. Neurologic: Eye opening to sternal rub.  Minimal movement to all 4 extremities at this time. Skin:  Skin is warm, dry and intact. No rash noted.   ____________________________________________   LABS (all labs ordered are listed, but only abnormal results are displayed)  Labs Reviewed  CBC WITH  DIFFERENTIAL/PLATELET - Abnormal; Notable for the following components:      Result Value   Lymphs Abs 0.8 (*)    All other components within normal limits  BASIC METABOLIC PANEL - Abnormal; Notable for the following components:   Glucose, Bld 221 (*)    Calcium 8.8 (*)    All other components within normal limits  AMMONIA  VALPROIC ACID LEVEL   ____________________________________________  EKG   ____________________________________________  RADIOLOGY   ____________________________________________   PROCEDURES  Procedure(s) performed:   Procedures  Critical Care performed:   ____________________________________________   INITIAL IMPRESSION / ASSESSMENT AND PLAN / ED COURSE  Pertinent labs & imaging results that were available during my care of the patient were reviewed by me and considered in my medical decision making (see chart for details).  DDX: Nausea and vomiting, seizure, postictal state As part of my medical decision making, I reviewed the following data within the [place] Notes from prior ED visits   ----------------------------------------- 1:00 PM on 05/25/2017 -----------------------------------------  Patient is now awake and alert and signing.  She has no complaints.  Able to tolerate p.o.  Will be discharged with Zofran as well as Keppra as was recommended by Dr. Doy Mince.  Dr. Doy Mince recommends 250 mg twice daily.  Patient will be following up with Dr. Manuella Ghazi.  Patient's daughter had stated that Keppra was prohibitively expensive but I was able to find a $14 recent coupon for the patient to be used at a supermarket.  We discussed this with the patient and she says that she will be able to afford this.  She will be discharged with his prescription.  Patient with reassuring labs.  Depakote was stopped once the level came back at 100.  Patient without any complaints of pain.  Reassuring white blood cell count.  Unclear cause of the  nausea and vomiting but symptoms appear to be resolved at this time.     ____________________________________________   FINAL CLINICAL IMPRESSION(S) / ED DIAGNOSES  Seizure.  Nausea and vomiting.    NEW MEDICATIONS STARTED DURING THIS VISIT:  This SmartLink is deprecated. Use AVSMEDLIST instead to display the medication list for a patient.  Note:  This document was prepared using Dragon voice recognition software and may include unintentional dictation errors.     Annet Manukyan, Randall An, MD 05/25/17 1301  Patient's daughter is concerned about side effects from Depakote.  Dr. Doy Mince had a lengthy discussion with the patient and family regarding decreasing the Depakote dose to 500 mg twice daily.  The patient will holding her evening dose of Depakote and will resume with a reduced dose tomorrow 500 mg.  Patient will also be referred to Dr. Delice Lesch, epilepsy neurologist at [facility].  Patient will continue her dose of Vimpat and will continue with her at a dose of the Keppra.    [name], MD 05/25/17 1346

## 2017-05-25 NOTE — ED Notes (Addendum)
[phone] Kenney [place], daughter

## 2017-05-25 NOTE — ED Notes (Signed)
Sign language interpreter requested by secretary

## 2017-05-25 NOTE — ED Notes (Signed)
PO challenge per edp, pt given OJ and crackers.

## 2017-05-25 NOTE — ED Triage Notes (Signed)
She arrives today via [facility] from home  Family called EMS due to she has had nausea with vomiting since 0800 this am  Upon EMS arrival to the home they witnessed seizure activity  Pt arrives here postictal - she is deaf and her daughter has arrived to the bedside to assist with sign language     CBG  279  Chronic 2L oxygen

## 2017-05-25 NOTE — ED Notes (Signed)
Sign language interpreter at bedside

## 2017-05-25 NOTE — ED Notes (Signed)
Sign language interpreter at bedside for d/c. PT signs she understands d/c and follow up with med change. PT in NAD at time of d/c, VS stable, pt in wheelchair to car.

## 2017-05-25 NOTE — Consult Note (Signed)
Reason for Consult:Seizure Referring Physician: Schaevitz  CC: Seizure, gait instability, difficulty with vision  HPI: [name] is an 66 y.o. female with a history of seizures, recently hospitalized for breakthrough seizures, discharged on Depakote and Vimpat.  Per report of her daughter since discharge  Has been unstable on her feet and unable to walk without an assistive device.  Also reports that she is unable to steady her eyes.  This morning the patient had multiple episodes of nausea and vomiting.  Was then noted to have a seizure.  EMS ws called at that time and the patient was brought in for evaluation.  It is unclear if she was able tyo keep her medications down due to the nausea and vomiting.     Past Medical History:  Diagnosis Date  . Asthma   . Cirrhosis, non-alcoholic ([place])   . COPD (chronic obstructive pulmonary disease) ([place])   . Deaf   . Depression   . Diabetes mellitus without complication ([place])   . GERD (gastroesophageal reflux disease)   . Heart murmur   . Hepatitis   . Hypertension   . Lymph node disorder    arm  . Neuropathy   . On home oxygen therapy    hs  . Orthopnea   . RLS (restless legs syndrome)   . Seizures ([place])   . Shortness of breath dyspnea   . Sleep apnea   . Stroke [facility])    tia    Past Surgical History:  Procedure Laterality Date  . CATARACT EXTRACTION PHACO AND INTRAOCULAR LENS PLACEMENT (IOC) Left 12/14/2014   Performed by Lyla Glassing, MD at [facility] ORS  . CATARACT EXTRACTION PHACO AND INTRAOCULAR LENS PLACEMENT ([place]) Right 11/23/2014   Performed by Lyla Glassing, MD at [facility] ORS  . CESAREAN SECTION    . CHOLECYSTECTOMY    . KNEE ARTHROPLASTY    . THUMB ARTHROSCOPY    . TONSILLECTOMY    . TYMPANOPLASTY     muliple    Family History  Problem Relation Age of Onset  . Lung cancer Mother   . CAD Father     Social History:  reports that she has been smoking.  She has been smoking about 0.50 packs per day. she has never  used smokeless tobacco. She reports that she does not drink alcohol or use drugs.  Allergies  Allergen Reactions  . Aspirin Itching  . Celebrex [Celecoxib] Itching    itching  . Ciprofloxacin Itching  . Codeine Itching  . Fosphenytoin Itching  . Levaquin [Levofloxacin In D5w] Itching  . Levofloxacin Itching  . Lovastatin Itching  . Penicillins     Documentation indicates severe reaction  Pt tolerated cephalosporin without adverse reaction 09/18   . Pravastatin Itching  . Sulfa Antibiotics Itching    Medications: I have reviewed the patient's current medications. Vimpat 235m BID and Depakote 7573mBID  ROS: History obtained from the patient and daughter  General ROS: negative for - chills, fatigue, fever, night sweats, weight gain or weight loss Psychological ROS: short term memory difficulties Ophthalmic ROS: blurry vision ENT ROS: deaf Allergy and Immunology ROS: negative for - hives or itchy/watery eyes Hematological and Lymphatic ROS: negative for - bleeding problems, bruising or swollen lymph nodes Endocrine ROS: negative for - galactorrhea, hair pattern changes, polydipsia/polyuria or temperature intolerance Respiratory ROS: negative for - cough, hemoptysis, shortness of breath or wheezing Cardiovascular ROS: negative for - chest pain, dyspnea on exertion, edema or irregular heartbeat Gastrointestinal ROS: nausea/vomiting  Genito-Urinary ROS: negative for - dysuria, hematuria, incontinence or urinary frequency/urgency Musculoskeletal ROS: negative for - joint swelling or muscular weakness Neurological ROS: as noted in HPI Dermatological ROS: negative for rash and skin lesion changes  Physical Examination: Blood pressure (!) 143/63, pulse (!) 56, temperature (!) 97.4 F (36.3 C), temperature source Axillary, resp. rate 14, height _0  (1.575 m), weight 77.6 kg (171 lb), SpO2 100 %.  HEENT-  Normocephalic, no lesions, without obvious abnormality.  Normal external  eye and conjunctiva.  Normal TM's bilaterally.  Normal auditory canals and external ears. Normal external nose, mucus membranes and septum.  Normal pharynx. Cardiovascular- S1, S2 normal, pulses palpable throughout   Lungs- chest clear, no wheezing, rales, normal symmetric air entry Abdomen- soft, non-tender; bowel sounds normal; no masses,  no organomegaly Extremities- no edema Lymph-no adenopathy palpable Musculoskeletal-no joint tenderness, deformity or swelling Skin-warm and dry, no hyperpigmentation, vitiligo, or suspicious lesions  Neurological Examination   Mental Status: Alert, oriented, some mild difficulties signing.  Able to follow 3 step commands without difficulty. Cranial Nerves: II: Discs flat bilaterally; Visual fields grossly normal, pupils equal, round, reactive to light and accommodation III,IV, VI: ptosis not present, extra-ocular motions intact bilaterally, nystagmus V,VII: smile symmetric, facial light touch sensation normal bilaterally VIII: deaf IX,X: gag reflex present XI: bilateral shoulder shrug XII: midline tongue extension Motor: Right : Upper extremity   5/5    Left:     Upper extremity   5/5  Lower extremity   5/5     Lower extremity   5/5 Tone and bulk:normal tone throughout; no atrophy noted Sensory: Pinprick and light touch intact throughout, bilaterally Deep Tendon Reflexes: 2+ and symmetric throughout Plantars: Right: downgoing   Left: downgoing Cerebellar: normal finger-to-nose testing Gait: not tested due to safety concerns    Laboratory Studies:   Basic Metabolic Panel: Recent Labs  Lab 05/22/17 1146 05/25/17 1119  NA 137 140  K 4.1 4.5  CL 101 102  CO2 25 24  GLUCOSE 179* 221*  BUN 10 12  CREATININE 0.62 0.67  CALCIUM 9.0 8.8*    Liver Function Tests: Recent Labs  Lab 05/22/17 1146  AST 39  ALT 16  ALKPHOS 177*  BILITOT 0.6  PROT 7.0  ALBUMIN 3.5   No results for input(s): LIPASE, AMYLASE in the last 168  hours. Recent Labs  Lab 05/25/17 1119  AMMONIA 25    CBC: Recent Labs  Lab 05/22/17 1146 05/25/17 1119  WBC 7.0 7.5  NEUTROABS  --  6.0  HGB 14.5 14.4  HCT 42.8 41.8  MCV 85.0 84.5  PLT 243 234    Cardiac Enzymes: Recent Labs  Lab 05/22/17 1146  TROPONINI <0.03    BNP: Invalid input(s): POCBNP  CBG: No results for input(s): GLUCAP in the last 168 hours.  Microbiology: Results for orders placed or performed during the hospital encounter of 04/29/17  Blood culture (routine x 2)     Status: None   Collection Time: 04/29/17 12:38 PM  Result Value Ref Range Status   Specimen Description BLOOD RFOA  Final   Special Requests   Final    BOTTLES DRAWN AEROBIC AND ANAEROBIC Blood Culture adequate volume   Culture NO GROWTH 5 DAYS  Final   Report Status 05/04/2017 FINAL  Final  Blood culture (routine x 2)     Status: None   Collection Time: 04/29/17 12:39 PM  Result Value Ref Range Status   Specimen Description BLOOD RIGHT HAND  Final  Special Requests   Final    BOTTLES DRAWN AEROBIC AND ANAEROBIC Blood Culture adequate volume   Culture NO GROWTH 5 DAYS  Final   Report Status 05/04/2017 FINAL  Final  Culture, respiratory (NON-Expectorated)     Status: None   Collection Time: 04/29/17  3:00 PM  Result Value Ref Range Status   Specimen Description TRACHEAL ASPIRATE  Final   Special Requests NONE  Final   Gram Stain   Final    MODERATE WBC PRESENT, PREDOMINANTLY PMN FEW GRAM POSITIVE COCCI FEW GRAM POSITIVE RODS    Culture   Final    Consistent with normal respiratory flora. Performed at [facility], [place] [address], [place], [place] [address]    Report Status 05/02/2017 FINAL  Final  MRSA PCR Screening     Status: None   Collection Time: 04/29/17  4:00 PM  Result Value Ref Range Status   MRSA by PCR NEGATIVE NEGATIVE Final    Comment:        The GeneXpert MRSA Assay (FDA approved for NASAL specimens only), is one component of a comprehensive MRSA  colonization surveillance program. It is not intended to diagnose MRSA infection nor to guide or monitor treatment for MRSA infections.     Coagulation Studies: No results for input(s): LABPROT, INR in the last 72 hours.  Urinalysis: No results for input(s): COLORURINE, LABSPEC, PHURINE, GLUCOSEU, HGBUR, BILIRUBINUR, KETONESUR, PROTEINUR, UROBILINOGEN, NITRITE, LEUKOCYTESUR in the last 168 hours.  Invalid input(s): APPERANCEUR  Lipid Panel:     Component Value Date/Time   TRIG 133 04/29/2017 1523    HgbA1C:  Lab Results  Component Value Date   HGBA1C 7.4 (H) 05/05/2017    Urine Drug Screen:      Component Value Date/Time   LABOPIA NONE DETECTED 04/29/2017 1312   COCAINSCRNUR NONE DETECTED 04/29/2017 1312   LABBENZ NONE DETECTED 04/29/2017 1312   AMPHETMU NONE DETECTED 04/29/2017 1312   THCU NONE DETECTED 04/29/2017 1312   LABBARB NONE DETECTED 04/29/2017 1312    Alcohol Level: No results for input(s): ETH in the last 168 hours.  Imaging: No results found.   Assessment/Plan: 66 year old female with a history of seizures presenting with breakthrough seizures.  On Vimpat nad Depakote at home.  Also from description patient appears to be having side effects to the Depakote (nystagmus, gait ataxia and possibly nausea/vomiting).  This was divulged alter in stay after the daughter arrived.  Due to nausea and vomiting has likely not gotten in AM dosing of anticonvulsant therapy.    Recommendations: 1.  Depakote 1072m IV now 2.  Vimpat 2063mIV now 3.  Depakote level  LeAlexis GoodellMD Neurology [id number] 46161/19/2018, 2:05 PM   Recommendations: 1.  With new information about side effects would  Skip Depakote dose tonight and restart in AM at 50062mID 2.  Continue Vimpat at 200m6mD 3.  Restart Keppra at 250mg68m 4.  Patient not seeing a neurologist at this time. [facility]  Case discussed with Dr. ShaevMoss [facility] [phone]

## 2017-05-25 NOTE — ED Notes (Signed)
Pt awake and alert at this time, NAD noted

## 2017-05-26 ENCOUNTER — Encounter: Payer: Self-pay | Admitting: Neurology

## 2017-05-26 DIAGNOSIS — J449 Chronic obstructive pulmonary disease, unspecified: Secondary | ICD-10-CM | POA: Diagnosis not present

## 2017-05-26 DIAGNOSIS — M150 Primary generalized (osteo)arthritis: Secondary | ICD-10-CM | POA: Diagnosis not present

## 2017-05-26 DIAGNOSIS — E119 Type 2 diabetes mellitus without complications: Secondary | ICD-10-CM | POA: Diagnosis not present

## 2017-05-26 DIAGNOSIS — G40909 Epilepsy, unspecified, not intractable, without status epilepticus: Secondary | ICD-10-CM | POA: Diagnosis not present

## 2017-05-26 DIAGNOSIS — M069 Rheumatoid arthritis, unspecified: Secondary | ICD-10-CM | POA: Diagnosis not present

## 2017-05-26 DIAGNOSIS — I10 Essential (primary) hypertension: Secondary | ICD-10-CM | POA: Diagnosis not present

## 2017-05-27 DIAGNOSIS — I10 Essential (primary) hypertension: Secondary | ICD-10-CM | POA: Diagnosis not present

## 2017-05-27 DIAGNOSIS — M150 Primary generalized (osteo)arthritis: Secondary | ICD-10-CM | POA: Diagnosis not present

## 2017-05-27 DIAGNOSIS — J449 Chronic obstructive pulmonary disease, unspecified: Secondary | ICD-10-CM | POA: Diagnosis not present

## 2017-05-27 DIAGNOSIS — E119 Type 2 diabetes mellitus without complications: Secondary | ICD-10-CM | POA: Diagnosis not present

## 2017-05-27 DIAGNOSIS — G40909 Epilepsy, unspecified, not intractable, without status epilepticus: Secondary | ICD-10-CM | POA: Diagnosis not present

## 2017-05-27 DIAGNOSIS — M069 Rheumatoid arthritis, unspecified: Secondary | ICD-10-CM | POA: Diagnosis not present

## 2017-05-29 DIAGNOSIS — M150 Primary generalized (osteo)arthritis: Secondary | ICD-10-CM | POA: Diagnosis not present

## 2017-05-29 DIAGNOSIS — G40909 Epilepsy, unspecified, not intractable, without status epilepticus: Secondary | ICD-10-CM | POA: Diagnosis not present

## 2017-05-29 DIAGNOSIS — I10 Essential (primary) hypertension: Secondary | ICD-10-CM | POA: Diagnosis not present

## 2017-05-29 DIAGNOSIS — J449 Chronic obstructive pulmonary disease, unspecified: Secondary | ICD-10-CM | POA: Diagnosis not present

## 2017-05-29 DIAGNOSIS — E119 Type 2 diabetes mellitus without complications: Secondary | ICD-10-CM | POA: Diagnosis not present

## 2017-05-29 DIAGNOSIS — M069 Rheumatoid arthritis, unspecified: Secondary | ICD-10-CM | POA: Diagnosis not present

## 2017-06-01 DIAGNOSIS — I10 Essential (primary) hypertension: Secondary | ICD-10-CM | POA: Diagnosis not present

## 2017-06-01 DIAGNOSIS — J449 Chronic obstructive pulmonary disease, unspecified: Secondary | ICD-10-CM | POA: Diagnosis not present

## 2017-06-01 DIAGNOSIS — M069 Rheumatoid arthritis, unspecified: Secondary | ICD-10-CM | POA: Diagnosis not present

## 2017-06-01 DIAGNOSIS — M150 Primary generalized (osteo)arthritis: Secondary | ICD-10-CM | POA: Diagnosis not present

## 2017-06-01 DIAGNOSIS — E119 Type 2 diabetes mellitus without complications: Secondary | ICD-10-CM | POA: Diagnosis not present

## 2017-06-01 DIAGNOSIS — G40909 Epilepsy, unspecified, not intractable, without status epilepticus: Secondary | ICD-10-CM | POA: Diagnosis not present

## 2017-06-02 DIAGNOSIS — E119 Type 2 diabetes mellitus without complications: Secondary | ICD-10-CM | POA: Diagnosis not present

## 2017-06-02 DIAGNOSIS — M150 Primary generalized (osteo)arthritis: Secondary | ICD-10-CM | POA: Diagnosis not present

## 2017-06-02 DIAGNOSIS — J449 Chronic obstructive pulmonary disease, unspecified: Secondary | ICD-10-CM | POA: Diagnosis not present

## 2017-06-02 DIAGNOSIS — G40909 Epilepsy, unspecified, not intractable, without status epilepticus: Secondary | ICD-10-CM | POA: Diagnosis not present

## 2017-06-02 DIAGNOSIS — I10 Essential (primary) hypertension: Secondary | ICD-10-CM | POA: Diagnosis not present

## 2017-06-02 DIAGNOSIS — M069 Rheumatoid arthritis, unspecified: Secondary | ICD-10-CM | POA: Diagnosis not present

## 2017-06-03 DIAGNOSIS — E119 Type 2 diabetes mellitus without complications: Secondary | ICD-10-CM | POA: Diagnosis not present

## 2017-06-03 DIAGNOSIS — J449 Chronic obstructive pulmonary disease, unspecified: Secondary | ICD-10-CM | POA: Diagnosis not present

## 2017-06-03 DIAGNOSIS — G40909 Epilepsy, unspecified, not intractable, without status epilepticus: Secondary | ICD-10-CM | POA: Diagnosis not present

## 2017-06-03 DIAGNOSIS — M069 Rheumatoid arthritis, unspecified: Secondary | ICD-10-CM | POA: Diagnosis not present

## 2017-06-03 DIAGNOSIS — M150 Primary generalized (osteo)arthritis: Secondary | ICD-10-CM | POA: Diagnosis not present

## 2017-06-03 DIAGNOSIS — I10 Essential (primary) hypertension: Secondary | ICD-10-CM | POA: Diagnosis not present

## 2017-06-05 DIAGNOSIS — E871 Hypo-osmolality and hyponatremia: Secondary | ICD-10-CM | POA: Diagnosis not present

## 2017-06-05 DIAGNOSIS — K219 Gastro-esophageal reflux disease without esophagitis: Secondary | ICD-10-CM | POA: Diagnosis not present

## 2017-06-05 DIAGNOSIS — R569 Unspecified convulsions: Secondary | ICD-10-CM | POA: Diagnosis not present

## 2017-06-06 DIAGNOSIS — J449 Chronic obstructive pulmonary disease, unspecified: Secondary | ICD-10-CM | POA: Diagnosis not present

## 2017-06-06 DIAGNOSIS — I6990 Unspecified sequelae of unspecified cerebrovascular disease: Secondary | ICD-10-CM | POA: Diagnosis not present

## 2017-06-06 DIAGNOSIS — M069 Rheumatoid arthritis, unspecified: Secondary | ICD-10-CM | POA: Diagnosis not present

## 2017-06-06 DIAGNOSIS — I10 Essential (primary) hypertension: Secondary | ICD-10-CM | POA: Diagnosis not present

## 2017-06-06 DIAGNOSIS — M150 Primary generalized (osteo)arthritis: Secondary | ICD-10-CM | POA: Diagnosis not present

## 2017-06-06 DIAGNOSIS — K210 Gastro-esophageal reflux disease with esophagitis: Secondary | ICD-10-CM | POA: Diagnosis not present

## 2017-06-06 DIAGNOSIS — J4520 Mild intermittent asthma, uncomplicated: Secondary | ICD-10-CM | POA: Diagnosis not present

## 2017-06-06 DIAGNOSIS — G40909 Epilepsy, unspecified, not intractable, without status epilepticus: Secondary | ICD-10-CM | POA: Diagnosis not present

## 2017-06-06 DIAGNOSIS — E119 Type 2 diabetes mellitus without complications: Secondary | ICD-10-CM | POA: Diagnosis not present

## 2017-06-06 DIAGNOSIS — I2723 Pulmonary hypertension due to lung diseases and hypoxia: Secondary | ICD-10-CM | POA: Diagnosis not present

## 2017-06-08 ENCOUNTER — Other Ambulatory Visit: Payer: Self-pay

## 2017-06-08 DIAGNOSIS — E119 Type 2 diabetes mellitus without complications: Secondary | ICD-10-CM | POA: Diagnosis not present

## 2017-06-08 DIAGNOSIS — M069 Rheumatoid arthritis, unspecified: Secondary | ICD-10-CM | POA: Diagnosis not present

## 2017-06-08 DIAGNOSIS — M150 Primary generalized (osteo)arthritis: Secondary | ICD-10-CM | POA: Diagnosis not present

## 2017-06-08 DIAGNOSIS — G40909 Epilepsy, unspecified, not intractable, without status epilepticus: Secondary | ICD-10-CM | POA: Diagnosis not present

## 2017-06-08 DIAGNOSIS — J449 Chronic obstructive pulmonary disease, unspecified: Secondary | ICD-10-CM | POA: Diagnosis not present

## 2017-06-08 DIAGNOSIS — I10 Essential (primary) hypertension: Secondary | ICD-10-CM | POA: Diagnosis not present

## 2017-06-08 NOTE — Patient Outreach (Addendum)
[facility]) Care Management  06/08/2017  LAWSON ISABELL 12/20/1950 [id number]   Transition of care  Referral date: 06/05/17 Referral source: Referral from [facility] at home. ( Per referral patient does not qualify for [employer] at home program) Insurance: [employer]  Telephone call to patient regarding [employer] at home / transition of care due to recent discharged from skilled nursing facility. Contact answering call identified herself as patients daughter and caregiver, Penny Pia. Daughter states patient is deaf and unable to speak over the phone. Daughter states patient was recently in the hospital from April 29, 2017 to May 07, 2017.  Daughter states patient was then transferred to [facility] from May 07, 2017 to May 21, 2017.  Daughter states patient currently has [facility] providing physical therapy. Daughter states she is patients primary care giver.  Daughter states patient lives alone and needs additional assistance. Daughter states patient needs assistance with bathing, meal preparation and light house keeping. Daughter states patient requires assistance with ambulation. Daughter states patient uses a wheelchair and walker. Daughter  states patient is unable to stand for long periods of time. Daughter states patient's medication makes her dizzy. Daughter states patient has had 3 falls and two emergency room visits since being discharged from [facility] on 05/21/17.  Daughter states patient needs assistance with her medications to make sure she is taking correctly. Daughter states patient complains of her medication causing dizziness.  Daughter states patient has an appointment to see a neurologist in February 2019.  Per daughter patient has diabetes, hypertension, and COPD. States patient is on 2L of  oxygen provided by [facility] at night and as needed during the day. Daughter verbally agreed to receive [facility] services for patient.  RNCM  advised patient to notify MD of any changes in condition prior to scheduled appointment. RNCM provided contact name and number: [phone] or main office number [phone] and 24 hour nurse advise line [phone].  RNCM verified patient aware of 911 services for urgent/ emergent needs.   PLAN: RNCM will refer patient to community case Freight forwarder and Education officer, museum.   Quinn Plowman RN,BSN,CCM [facility] Telephonic  [phone]

## 2017-06-08 NOTE — Addendum Note (Signed)
Addended by: Quinn Plowman E on: 06/08/2017 01:54 PM   Modules accepted: Orders

## 2017-06-09 DIAGNOSIS — M150 Primary generalized (osteo)arthritis: Secondary | ICD-10-CM | POA: Diagnosis not present

## 2017-06-09 DIAGNOSIS — M069 Rheumatoid arthritis, unspecified: Secondary | ICD-10-CM | POA: Diagnosis not present

## 2017-06-09 DIAGNOSIS — G40909 Epilepsy, unspecified, not intractable, without status epilepticus: Secondary | ICD-10-CM | POA: Diagnosis not present

## 2017-06-09 DIAGNOSIS — I10 Essential (primary) hypertension: Secondary | ICD-10-CM | POA: Diagnosis not present

## 2017-06-09 DIAGNOSIS — J449 Chronic obstructive pulmonary disease, unspecified: Secondary | ICD-10-CM | POA: Diagnosis not present

## 2017-06-09 DIAGNOSIS — E119 Type 2 diabetes mellitus without complications: Secondary | ICD-10-CM | POA: Diagnosis not present

## 2017-06-10 DIAGNOSIS — M069 Rheumatoid arthritis, unspecified: Secondary | ICD-10-CM | POA: Diagnosis not present

## 2017-06-10 DIAGNOSIS — M150 Primary generalized (osteo)arthritis: Secondary | ICD-10-CM | POA: Diagnosis not present

## 2017-06-10 DIAGNOSIS — I10 Essential (primary) hypertension: Secondary | ICD-10-CM | POA: Diagnosis not present

## 2017-06-10 DIAGNOSIS — J449 Chronic obstructive pulmonary disease, unspecified: Secondary | ICD-10-CM | POA: Diagnosis not present

## 2017-06-10 DIAGNOSIS — E119 Type 2 diabetes mellitus without complications: Secondary | ICD-10-CM | POA: Diagnosis not present

## 2017-06-10 DIAGNOSIS — G40909 Epilepsy, unspecified, not intractable, without status epilepticus: Secondary | ICD-10-CM | POA: Diagnosis not present

## 2017-06-11 ENCOUNTER — Other Ambulatory Visit: Payer: Self-pay | Admitting: *Deleted

## 2017-06-11 DIAGNOSIS — M150 Primary generalized (osteo)arthritis: Secondary | ICD-10-CM | POA: Diagnosis not present

## 2017-06-11 DIAGNOSIS — I10 Essential (primary) hypertension: Secondary | ICD-10-CM | POA: Diagnosis not present

## 2017-06-11 DIAGNOSIS — J449 Chronic obstructive pulmonary disease, unspecified: Secondary | ICD-10-CM | POA: Diagnosis not present

## 2017-06-11 DIAGNOSIS — G40909 Epilepsy, unspecified, not intractable, without status epilepticus: Secondary | ICD-10-CM | POA: Diagnosis not present

## 2017-06-11 DIAGNOSIS — M069 Rheumatoid arthritis, unspecified: Secondary | ICD-10-CM | POA: Diagnosis not present

## 2017-06-11 DIAGNOSIS — E119 Type 2 diabetes mellitus without complications: Secondary | ICD-10-CM | POA: Diagnosis not present

## 2017-06-11 NOTE — Patient Outreach (Signed)
Successful telephone encounter to daughter [name] (caregiver, main contact, pt deaf) follow up on referral received 06/08/17  from [facility] for Community CM services, recent SNF discharge 05/21/17.  Original referral from [facility] at home on 05/16/17- pt does not qualify for [employer] at home program.  Recent hospitalization prior to admit to rehab was  04/29/17 to 05/07/17 for hypokalemia, status epilepticus, acute respiratory failure with hypoxia, AKI, HCAP, Sepsis.  Spoke with daughter [name], HIPAA identifiers provided on pt.  Daughter reports pt's condition on and off, learning to walk, Home health to discharge soon. Daughter reports pt lives alone but she is there checking on her, what pt needs is a personal care aide/help with the house to which RN CM relayed [facility] LCSW was referred.    Daughter reports pt's sugars and BP are  good, shaking from increase of Depakote medication, pt not scheduled to see Neurologist until February 2019.  RN CM discussed following up with pt's PCP if needed about pt's shaking to  which daughter reports she switched pt's PCP to [facility] on 06/05/17 office visit done, to keep Dr. Lavera Guise as pt's Heart MD. Daughter reports pt was suppose to follow up again at [facility] 12/19 but had to cancel appointment.   RN CM discussed with daughter pt not eligible for [facility] services if does not have a [facility] MD, to call [facility] office/verify [facility] if [facility] provider  and call her back.     4:21 pm- follow up call to daughter, relayed [employer] not a [facility] provider which makes her not eligible for [facility] services at this time.     Plan:  As discussed with daughter, to close case.            RN CM to provide update to  Chrystal LCSW.   Zara Chess.   [place] Care Management  [phone]

## 2017-06-12 ENCOUNTER — Other Ambulatory Visit: Payer: Self-pay | Admitting: *Deleted

## 2017-06-12 ENCOUNTER — Other Ambulatory Visit: Payer: Self-pay | Admitting: Pharmacist

## 2017-06-12 DIAGNOSIS — G40909 Epilepsy, unspecified, not intractable, without status epilepticus: Secondary | ICD-10-CM | POA: Diagnosis not present

## 2017-06-12 DIAGNOSIS — M069 Rheumatoid arthritis, unspecified: Secondary | ICD-10-CM | POA: Diagnosis not present

## 2017-06-12 DIAGNOSIS — J449 Chronic obstructive pulmonary disease, unspecified: Secondary | ICD-10-CM | POA: Diagnosis not present

## 2017-06-12 DIAGNOSIS — I10 Essential (primary) hypertension: Secondary | ICD-10-CM | POA: Diagnosis not present

## 2017-06-12 DIAGNOSIS — M150 Primary generalized (osteo)arthritis: Secondary | ICD-10-CM | POA: Diagnosis not present

## 2017-06-12 DIAGNOSIS — E119 Type 2 diabetes mellitus without complications: Secondary | ICD-10-CM | POA: Diagnosis not present

## 2017-06-12 NOTE — Patient Outreach (Signed)
[facility]) Care Management  06/12/2017  [name] [birth year] [id number]   Patient referred to this social worker to assist with community resources to address need for in home assistance.  This Education officer, museum spoke with patient's daughter, [name] who confirms that patient has changed primary care to [facility] which makes her ineligible for [facility] care management services. This Education officer, museum explored patient's in home needs with patient's daughter who discussed patient's  difficulty with her mobility, cooking, cleaning, and taking her medications. Per patient's daughter, patient does not qualify for full  medicaid benefits and her support system is very limited. Per patient's daughter, patient is unable to pay out of pocket for in home care. Patient depends on her for care mainly. Patient currently receiving PT through [facility] and a bath aid, however this is only temporary.  This social worker explained to patient's daughter, that [facility] is not eligible for [facility] at this time as she does not have a [place]. This social however did provide patient's daughter with the contact information for [place] to assist with additional resources for in home and respite care.   Plan: Patient to be closed to [facility] care management at this time.   [name] [facility] Care Management [phone]

## 2017-06-12 NOTE — Patient Outreach (Signed)
[facility]) Care Management  06/12/2017  [name] [birth year] [id number]  66 year old female referred to [place] [facility].  Pharmacy services requested for medication management.  [facility] RN outreached patient on 06/11/17 and verified that patient does not have a [facility] provider therefore is not eligible for [facility] services.  Patient has been notified by [facility] RN of case closure.    [facility] pharmacy will also close patient case at this time as patient is not eligible for services.   I will alert [facility] CMA of case closure.   Ralene Bathe, PharmD, [place] [phone]

## 2017-06-15 ENCOUNTER — Encounter: Payer: Self-pay | Admitting: *Deleted

## 2017-06-15 ENCOUNTER — Emergency Department: Payer: Medicare HMO

## 2017-06-15 ENCOUNTER — Other Ambulatory Visit: Payer: Self-pay

## 2017-06-15 ENCOUNTER — Inpatient Hospital Stay
Admission: EM | Admit: 2017-06-15 | Discharge: 2017-06-17 | DRG: 101 | Disposition: A | Discharge status: Home Health | Payer: Medicare HMO | Attending: Internal Medicine | Admitting: Internal Medicine

## 2017-06-15 DIAGNOSIS — Z961 Presence of intraocular lens: Secondary | ICD-10-CM | POA: Diagnosis present

## 2017-06-15 DIAGNOSIS — G40909 Epilepsy, unspecified, not intractable, without status epilepticus: Secondary | ICD-10-CM | POA: Diagnosis not present

## 2017-06-15 DIAGNOSIS — Z9841 Cataract extraction status, right eye: Secondary | ICD-10-CM | POA: Diagnosis not present

## 2017-06-15 DIAGNOSIS — R4182 Altered mental status, unspecified: Secondary | ICD-10-CM | POA: Diagnosis not present

## 2017-06-15 DIAGNOSIS — Z8673 Personal history of transient ischemic attack (TIA), and cerebral infarction without residual deficits: Secondary | ICD-10-CM

## 2017-06-15 DIAGNOSIS — Z9981 Dependence on supplemental oxygen: Secondary | ICD-10-CM | POA: Diagnosis not present

## 2017-06-15 DIAGNOSIS — K7460 Unspecified cirrhosis of liver: Secondary | ICD-10-CM | POA: Diagnosis not present

## 2017-06-15 DIAGNOSIS — Z882 Allergy status to sulfonamides status: Secondary | ICD-10-CM

## 2017-06-15 DIAGNOSIS — Z7984 Long term (current) use of oral hypoglycemic drugs: Secondary | ICD-10-CM | POA: Diagnosis not present

## 2017-06-15 DIAGNOSIS — I10 Essential (primary) hypertension: Secondary | ICD-10-CM | POA: Diagnosis not present

## 2017-06-15 DIAGNOSIS — K219 Gastro-esophageal reflux disease without esophagitis: Secondary | ICD-10-CM | POA: Diagnosis not present

## 2017-06-15 DIAGNOSIS — Z7902 Long term (current) use of antithrombotics/antiplatelets: Secondary | ICD-10-CM | POA: Diagnosis not present

## 2017-06-15 DIAGNOSIS — G9341 Metabolic encephalopathy: Secondary | ICD-10-CM | POA: Diagnosis not present

## 2017-06-15 DIAGNOSIS — G40919 Epilepsy, unspecified, intractable, without status epilepticus: Secondary | ICD-10-CM

## 2017-06-15 DIAGNOSIS — Z7951 Long term (current) use of inhaled steroids: Secondary | ICD-10-CM

## 2017-06-15 DIAGNOSIS — Z880 Allergy status to penicillin: Secondary | ICD-10-CM

## 2017-06-15 DIAGNOSIS — R0781 Pleurodynia: Secondary | ICD-10-CM

## 2017-06-15 DIAGNOSIS — E876 Hypokalemia: Secondary | ICD-10-CM | POA: Diagnosis not present

## 2017-06-15 DIAGNOSIS — J449 Chronic obstructive pulmonary disease, unspecified: Secondary | ICD-10-CM | POA: Diagnosis present

## 2017-06-15 DIAGNOSIS — Z9842 Cataract extraction status, left eye: Secondary | ICD-10-CM

## 2017-06-15 DIAGNOSIS — F17210 Nicotine dependence, cigarettes, uncomplicated: Secondary | ICD-10-CM | POA: Diagnosis present

## 2017-06-15 DIAGNOSIS — R569 Unspecified convulsions: Secondary | ICD-10-CM | POA: Diagnosis not present

## 2017-06-15 DIAGNOSIS — H9190 Unspecified hearing loss, unspecified ear: Secondary | ICD-10-CM | POA: Diagnosis not present

## 2017-06-15 DIAGNOSIS — E119 Type 2 diabetes mellitus without complications: Secondary | ICD-10-CM | POA: Diagnosis not present

## 2017-06-15 DIAGNOSIS — Z888 Allergy status to other drugs, medicaments and biological substances status: Secondary | ICD-10-CM | POA: Diagnosis not present

## 2017-06-15 DIAGNOSIS — Z881 Allergy status to other antibiotic agents status: Secondary | ICD-10-CM

## 2017-06-15 DIAGNOSIS — Z79899 Other long term (current) drug therapy: Secondary | ICD-10-CM | POA: Diagnosis not present

## 2017-06-15 DIAGNOSIS — Z66 Do not resuscitate: Secondary | ICD-10-CM | POA: Diagnosis present

## 2017-06-15 DIAGNOSIS — J9811 Atelectasis: Secondary | ICD-10-CM | POA: Diagnosis not present

## 2017-06-15 DIAGNOSIS — Z886 Allergy status to analgesic agent status: Secondary | ICD-10-CM | POA: Diagnosis not present

## 2017-06-15 DIAGNOSIS — G2581 Restless legs syndrome: Secondary | ICD-10-CM | POA: Diagnosis present

## 2017-06-15 DIAGNOSIS — G4730 Sleep apnea, unspecified: Secondary | ICD-10-CM | POA: Diagnosis present

## 2017-06-15 DIAGNOSIS — R531 Weakness: Secondary | ICD-10-CM | POA: Diagnosis not present

## 2017-06-15 LAB — COMPREHENSIVE METABOLIC PANEL
ALT: 14 U/L (ref 14–54)
ANION GAP: 11 (ref 5–15)
AST: 32 U/L (ref 15–41)
Albumin: 3.2 g/dL — ABNORMAL LOW (ref 3.5–5.0)
Alkaline Phosphatase: 160 U/L — ABNORMAL HIGH (ref 38–126)
BUN: 13 mg/dL (ref 6–20)
CHLORIDE: 98 mmol/L — AB (ref 101–111)
CO2: 31 mmol/L (ref 22–32)
CREATININE: 0.53 mg/dL (ref 0.44–1.00)
Calcium: 9 mg/dL (ref 8.9–10.3)
Glucose, Bld: 199 mg/dL — ABNORMAL HIGH (ref 65–99)
POTASSIUM: 2.4 mmol/L — AB (ref 3.5–5.1)
SODIUM: 140 mmol/L (ref 135–145)
Total Bilirubin: 0.6 mg/dL (ref 0.3–1.2)
Total Protein: 6.9 g/dL (ref 6.5–8.1)

## 2017-06-15 LAB — CBC WITH DIFFERENTIAL/PLATELET
Basophils Absolute: 0 10*3/uL (ref 0–0.1)
Basophils Relative: 1 %
EOS ABS: 0.1 10*3/uL (ref 0–0.7)
EOS PCT: 1 %
HCT: 42.1 % (ref 35.0–47.0)
Hemoglobin: 14.9 g/dL (ref 12.0–16.0)
LYMPHS ABS: 2.3 10*3/uL (ref 1.0–3.6)
LYMPHS PCT: 41 %
MCH: 29.4 pg (ref 26.0–34.0)
MCHC: 35.3 g/dL (ref 32.0–36.0)
MCV: 83.1 fL (ref 80.0–100.0)
MONO ABS: 0.5 10*3/uL (ref 0.2–0.9)
Monocytes Relative: 8 %
Neutro Abs: 2.8 10*3/uL (ref 1.4–6.5)
Neutrophils Relative %: 49 %
PLATELETS: 191 10*3/uL (ref 150–440)
RBC: 5.07 MIL/uL (ref 3.80–5.20)
RDW: 14.6 % — AB (ref 11.5–14.5)
WBC: 5.7 10*3/uL (ref 3.6–11.0)

## 2017-06-15 LAB — TROPONIN I

## 2017-06-15 MED ORDER — POLYETHYLENE GLYCOL 3350 17 G PO PACK: 17.0000 g | PACK | Freq: Every day | ORAL | Status: DC | PRN

## 2017-06-15 MED ORDER — LORAZEPAM 2 MG/ML IJ SOLN
INTRAMUSCULAR | Status: AC
  Filled 2017-06-15: qty 1

## 2017-06-15 MED ORDER — SODIUM CHLORIDE 0.9 % IV SOLN
500.0000 mg | Freq: Two times a day (BID) | INTRAVENOUS | Status: DC
  Administered 2017-06-15 – 2017-06-16 (×2): 500 mg via INTRAVENOUS
  Filled 2017-06-15 (×3): qty 5

## 2017-06-15 MED ORDER — DOCUSATE SODIUM 100 MG PO CAPS
100.0000 mg | ORAL_CAPSULE | Freq: Two times a day (BID) | ORAL | Status: DC
  Filled 2017-06-15: qty 1

## 2017-06-15 MED ORDER — ONDANSETRON HCL 4 MG/2ML IJ SOLN: 4.0000 mg | Freq: Four times a day (QID) | INTRAMUSCULAR | Status: DC | PRN

## 2017-06-15 MED ORDER — POTASSIUM CHLORIDE 10 MEQ/100ML IV SOLN
10.0000 meq | INTRAVENOUS | Status: AC
  Administered 2017-06-15 (×2): 10 meq via INTRAVENOUS
  Filled 2017-06-15 (×5): qty 100

## 2017-06-15 MED ORDER — SODIUM CHLORIDE 0.9 % IV SOLN
INTRAVENOUS | Status: DC
  Administered 2017-06-15 – 2017-06-17 (×3): via INTRAVENOUS

## 2017-06-15 MED ORDER — SODIUM CHLORIDE 0.9 % IV SOLN
1000.0000 mg | Freq: Once | INTRAVENOUS | Status: AC
  Administered 2017-06-15: 1000 mg via INTRAVENOUS
  Filled 2017-06-15: qty 10

## 2017-06-15 MED ORDER — LORAZEPAM 2 MG/ML IJ SOLN
1.0000 mg | Freq: Once | INTRAMUSCULAR | Status: AC
  Administered 2017-06-15: 1 mg via INTRAVENOUS

## 2017-06-15 MED ORDER — ENOXAPARIN SODIUM 40 MG/0.4ML ~~LOC~~ SOLN
40.0000 mg | SUBCUTANEOUS | Status: DC
  Administered 2017-06-15 – 2017-06-16 (×2): 40 mg via SUBCUTANEOUS
  Filled 2017-06-15 (×2): qty 0.4

## 2017-06-15 MED ORDER — ACETAMINOPHEN 650 MG RE SUPP: 650.0000 mg | Freq: Four times a day (QID) | RECTAL | Status: DC | PRN

## 2017-06-15 MED ORDER — ACETAMINOPHEN 325 MG PO TABS
650.0000 mg | ORAL_TABLET | Freq: Four times a day (QID) | ORAL | Status: DC | PRN
  Administered 2017-06-16: 650 mg via ORAL
  Filled 2017-06-15: qty 2

## 2017-06-15 MED ORDER — ONDANSETRON HCL 4 MG PO TABS: 4.0000 mg | ORAL_TABLET | Freq: Four times a day (QID) | ORAL | Status: DC | PRN

## 2017-06-15 MED ORDER — LORAZEPAM 2 MG/ML IJ SOLN: 2.0000 mg | Freq: Four times a day (QID) | INTRAMUSCULAR | Status: DC | PRN

## 2017-06-15 MED ORDER — SODIUM CHLORIDE 0.9 % IV BOLUS (SEPSIS)
500.0000 mL | Freq: Once | INTRAVENOUS | Status: AC
  Administered 2017-06-15: 500 mL via INTRAVENOUS

## 2017-06-15 NOTE — ED Notes (Signed)
Patient transported to CT

## 2017-06-15 NOTE — ED Notes (Signed)
meds infusing  Pt awake.  Pt deaf.  Sinus brady on monitor.  siderails up x 2.

## 2017-06-15 NOTE — ED Notes (Signed)
Pt brought in via ems from home with a seizure. Pt was found on the bathroom floor.  Pt has a bruise to right flank, left shoulder.  Daughter states she falls a lot and more than usual.  Pt is deaf.  Pt signs with daughter.  Iv in place.  Labs sent.  meds given.  siderails up x 2 with seizure pads in place.  Pt is postictal.  Sinus brady on monitor.   Skin warm and dry   Pt was released from [facility] last month.

## 2017-06-15 NOTE — ED Triage Notes (Signed)
Pt brought in via ems from home with a seizure.  Pt was found on the bathroom floor, postictal.  Iv in place.  Pt is deaf.

## 2017-06-15 NOTE — H&P (Signed)
[facility] NAME: [name]    MR#:  [id number]  DATE OF BIRTH:  09/25/1950  DATE OF ADMISSION:  06/15/2017  PRIMARY CARE PHYSICIAN: [facility]   REQUESTING/REFERRING PHYSICIAN: Dr. Clearnce Hasten  CHIEF COMPLAINT:  Patient found unresponsive in the bathroom at home by neighbors  HISTORY OF PRESENT ILLNESS:  [name]  is a 66 y.o. female with a known history of seizures, nonalcoholic cirrhosis, diabetes comes to the emergency room brought by EMS after neighbors found patient unresponsive in her bathroom at home.  Patient currently sedated after she received antiseizure meds in the emergency room.  No family [place].  Spoke with daughter [name] who gave the above information  Per dter [name] patient is a DNR   PAST MEDICAL HISTORY:   Past Medical History:  Diagnosis Date  . Asthma   . Cirrhosis, non-alcoholic ([place])   . COPD (chronic obstructive pulmonary disease) ([place])   . Deaf   . Depression   . Diabetes mellitus without complication ([place])   . GERD (gastroesophageal reflux disease)   . Heart murmur   . Hepatitis   . Hypertension   . Lymph node disorder    arm  . Neuropathy   . On home oxygen therapy    hs  . Orthopnea   . RLS (restless legs syndrome)   . Seizures ([place])   . Shortness of breath dyspnea   . Sleep apnea   . Stroke ([place])    tia    PAST SURGICAL HISTOIRY:   Past Surgical History:  Procedure Laterality Date  . CATARACT EXTRACTION W/PHACO Right 11/23/2014   Procedure: CATARACT EXTRACTION PHACO AND INTRAOCULAR LENS PLACEMENT (IOC);  Surgeon: Lyla Glassing, MD;  Location: [facility] ORS;  Service: Ophthalmology;  Laterality: Right;  . CATARACT EXTRACTION W/PHACO Left 12/14/2014   Procedure: CATARACT EXTRACTION PHACO AND INTRAOCULAR LENS PLACEMENT (IOC);  Surgeon: Lyla Glassing, MD;  Location: [facility] ORS;  Service: Ophthalmology;  Laterality: Left;   US:01:16.6 AP:15.8 CDE:12.14  . CESAREAN SECTION    . CHOLECYSTECTOMY    . KNEE ARTHROPLASTY    . THUMB ARTHROSCOPY    . TONSILLECTOMY    . TYMPANOPLASTY     muliple    SOCIAL HISTORY:   Social History   Tobacco Use  . Smoking status: Current Every Day Smoker    Packs/day: 0.50  . Smokeless tobacco: Never Used  Substance Use Topics  . Alcohol use: No    FAMILY HISTORY:   Family History  Problem Relation Age of Onset  . Lung cancer Mother   . CAD Father     DRUG ALLERGIES:   Allergies  Allergen Reactions  . Aspirin Itching  . Celebrex [Celecoxib] Itching    itching  . Ciprofloxacin Itching  . Codeine Itching  . Fosphenytoin Itching  . Levaquin [Levofloxacin In D5w] Itching  . Levofloxacin Itching  . Lovastatin Itching  . Penicillins     Documentation indicates severe reaction  Pt tolerated cephalosporin without adverse reaction 09/18   . Pravastatin Itching  . Sulfa Antibiotics Itching    REVIEW OF SYSTEMS:  Review of Systems  Unable to perform ROS: Medical condition  Neurological: Negative for seizures.     MEDICATIONS AT HOME:   Prior to Admission medications   Medication Sig Start Date End Date Taking? Authorizing Provider  albuterol (PROVENTIL) (2.5 MG/3ML) 0.083% nebulizer solution Take 3 mLs (2.5 mg total) by nebulization every 4 (four) hours  as needed for wheezing or shortness of breath. 07/17/16  Yes Vaughan Basta, MD  citalopram (CELEXA) 20 MG tablet Take 1 tablet (20 mg total) by mouth daily. 07/17/16  Yes Vaughan Basta, MD  clopidogrel (PLAVIX) 75 MG tablet Take 1 tablet (75 mg total) by mouth daily. 07/17/16  Yes Vaughan Basta, MD  diltiazem (DILACOR XR) 180 MG 24 hr capsule Take 180 mg by mouth daily.   Yes [provider]  divalproex (DEPAKOTE) 250 MG DR tablet Take 3 tablets (750 mg total) by mouth every 12 (twelve) hours. 05/07/17  Yes Wieting, Richard, MD  furosemide (LASIX) 40 MG tablet Take 1 tablet  (40 mg total) by mouth daily. Patient taking differently: Take 20 mg by mouth daily.  07/17/16  Yes Vaughan Basta, MD  GLIPIZIDE XL 5 MG 24 hr tablet Take 1 tablet (5 mg total) by mouth daily. 05/07/17  Yes Wieting, Richard, MD  lacosamide (VIMPAT) 200 MG TABS tablet Take 1 tablet (200 mg total) by mouth 2 (two) times daily. 05/07/17  Yes Loletha Grayer, MD  levETIRAcetam (KEPPRA) 250 MG tablet Take 1 tablet (250 mg total) 2 (two) times daily by mouth. 05/25/17  Yes Schaevitz, Randall An, MD  magnesium oxide (MAG-OX) 400 (241.3 Mg) MG tablet Take 1 tablet (400 mg total) by mouth daily. 05/07/17  Yes Wieting, Richard, MD  mometasone-formoterol [facility]) 200-5 MCG/ACT AERO Inhale 2 puffs into the lungs 2 (two) times daily. 07/17/16  Yes Vaughan Basta, MD  montelukast (SINGULAIR) 10 MG tablet Take 1 tablet by mouth at bedtime. 03/18/16  Yes [provider]  omeprazole (PRILOSEC) 20 MG capsule Take 20 mg by mouth daily.   Yes [provider]  rOPINIRole (REQUIP) 0.25 MG tablet Take 1 tablet (0.25 mg total) by mouth 3 (three) times daily. 03/24/16  Yes Oswald Hillock, MD  rosuvastatin (CRESTOR) 10 MG tablet Take 10 mg by mouth daily. 04/21/17  Yes [provider]  albuterol (PROVENTIL HFA;VENTOLIN HFA) 108 (90 BASE) MCG/ACT inhaler Inhale 2 puffs into the lungs every 4 (four) hours as needed for wheezing or shortness of breath.    [provider]      VITAL SIGNS:  Blood pressure (!) 132/59, pulse (!) 51, temperature 98.3 F (36.8 C), temperature source Oral, resp. rate (!) 29, height _0  (1.575 m), weight 77.6 kg (171 lb), SpO2 96 %.  PHYSICAL EXAMINATION:  GENERAL:  66 y.o.-year-old patient lying in the bed with no acute distress.  EYES: Pupils equal, round, reactive to light and accommodation. No scleral icterus.  HEENT: Head atraumatic, normocephalic. Oropharynx and nasopharynx clear.  NECK:  Supple, no jugular venous distention. No thyroid  enlargement, no tenderness.  LUNGS: Normal breath sounds bilaterally, no wheezing, rales,rhonchi or crepitation. No use of accessory muscles of respiration.  CARDIOVASCULAR: S1, S2 normal. No murmurs, rubs, or gallops.  ABDOMEN: Soft, nontender, nondistended. Bowel sounds present. No organomegaly or mass.  EXTREMITIES: No pedal edema, cyanosis, or clubbing.  NEUROLOGIC: Patient is postictal and sedated with IV meds in the ER  pSYCHIATRIC: Sedated  sKIN: No obvious rash, lesion, or ulcer.   LABORATORY PANEL:   CBC Recent Labs  Lab 06/15/17 1545  WBC 5.7  HGB 14.9  HCT 42.1  PLT 191   ------------------------------------------------------------------------------------------------------------------  Chemistries  Recent Labs  Lab 06/15/17 1545  NA 140  K 2.4*  CL 98*  CO2 31  GLUCOSE 199*  BUN 13  CREATININE 0.53  CALCIUM 9.0  AST 32  ALT 14  ALKPHOS 160*  BILITOT 0.6   ------------------------------------------------------------------------------------------------------------------  Cardiac Enzymes Recent Labs  Lab 06/15/17 1545  TROPONINI <0.03   ------------------------------------------------------------------------------------------------------------------  RADIOLOGY:  Ct Head Wo Contrast  Result Date: 06/15/2017 CLINICAL DATA:  Seizure.  Found down. EXAM: CT HEAD WITHOUT CONTRAST CT CERVICAL SPINE WITHOUT CONTRAST TECHNIQUE: Multidetector CT imaging of the head and cervical spine was performed following the standard protocol without intravenous contrast. Multiplanar CT image reconstructions of the cervical spine were also generated. COMPARISON:  05/22/2017 FINDINGS: CT HEAD FINDINGS Brain: There is no evidence for acute hemorrhage, hydrocephalus, mass lesion, or abnormal extra-axial fluid collection. No definite CT evidence for acute infarction. Patchy low attenuation in the deep hemispheric and periventricular white matter is nonspecific, but likely reflects  chronic microvascular ischemic demyelination. Vascular: No hyperdense vessel or unexpected calcification. Skull: No evidence for fracture. No worrisome lytic or sclerotic lesion. Sinuses/Orbits: Status post left mastoid surgery. The visualized paranasal sinuses and mastoid air cells are clear. Visualized portions of the globes and intraorbital fat are unremarkable. CT CERVICAL SPINE FINDINGS Alignment: Normal. Skull base and vertebrae: No acute fracture. No primary bone lesion or focal pathologic process. Soft tissues and spinal canal: No prevertebral fluid or swelling. No visible canal hematoma. Disc levels: Loss of disc height at C5-6 with endplate degeneration. Upper chest: 9 mm short axis high right paraesophageal lymph node is upper normal for size. Emphysema noted in the lung apices. Other: None. IMPRESSION: 1. No acute intracranial abnormality. 2. Chronic microvascular white matter ischemic disease. 3. Degenerative changes at C5-6 without cervical spine fracture. Electronically Signed   By: Misty Stanley M.D.   On: 06/15/2017 16:40   Ct Cervical Spine Wo Contrast  Result Date: 06/15/2017 CLINICAL DATA:  Seizure.  Found down. EXAM: CT HEAD WITHOUT CONTRAST CT CERVICAL SPINE WITHOUT CONTRAST TECHNIQUE: Multidetector CT imaging of the head and cervical spine was performed following the standard protocol without intravenous contrast. Multiplanar CT image reconstructions of the cervical spine were also generated. COMPARISON:  05/22/2017 FINDINGS: CT HEAD FINDINGS Brain: There is no evidence for acute hemorrhage, hydrocephalus, mass lesion, or abnormal extra-axial fluid collection. No definite CT evidence for acute infarction. Patchy low attenuation in the deep hemispheric and periventricular white matter is nonspecific, but likely reflects chronic microvascular ischemic demyelination. Vascular: No hyperdense vessel or unexpected calcification. Skull: No evidence for fracture. No worrisome lytic or sclerotic  lesion. Sinuses/Orbits: Status post left mastoid surgery. The visualized paranasal sinuses and mastoid air cells are clear. Visualized portions of the globes and intraorbital fat are unremarkable. CT CERVICAL SPINE FINDINGS Alignment: Normal. Skull base and vertebrae: No acute fracture. No primary bone lesion or focal pathologic process. Soft tissues and spinal canal: No prevertebral fluid or swelling. No visible canal hematoma. Disc levels: Loss of disc height at C5-6 with endplate degeneration. Upper chest: 9 mm short axis high right paraesophageal lymph node is upper normal for size. Emphysema noted in the lung apices. Other: None. IMPRESSION: 1. No acute intracranial abnormality. 2. Chronic microvascular white matter ischemic disease. 3. Degenerative changes at C5-6 without cervical spine fracture. Electronically Signed   By: Misty Stanley M.D.   On: 06/15/2017 16:40    EKG:    IMPRESSION AND PLAN:    [name]  is a 66 y.o. female with a known history of seizures, nonalcoholic cirrhosis, diabetes comes to the emergency room brought by EMS after neighbors found patient unresponsive in her bathroom at home.  Patient currently sedated after she received antiseizure meds in the  emergency room.  1.  Acute metabolic encephalopathy with suspected seizures at home -Was found in the bathroom by neighbors brought to the emergency room she received a loading dose of IV Keppra thousand milligrams and 3 mg of IV Ativan. -We will continue seizure precaution -He is supposed to be on Vimpat and Depakote at home.  Per daughter she is taking the medications -Neurology consultation -CT head and CT cervical spine negative -Continue IV fluids  2.  Hypokalemia we will replete with IV potassium  3.  Diabetes sliding scale for now  4.  DVT prophylaxis subcu Lovenox  Patient's medication reconciliation is not done since pharmacy tech was not able to reach her regular pharmacy.  This needs to be done  tomorrow morning  All the records are reviewed and case discussed with ED provider. Management plans discussed with the patient, family and they are in agreement.  CODE STATUS: She is DNR--- confirmed with daughter [name]  TOTAL TIME TAKING CARE OF THIS PATIENT: *50* minutes.    Fritzi Mandes M.D on 06/15/2017 at 7:14 PM  Between 7am to 6pm - Pager - [phone]  After 6pm go to [email or web address] - password [email or web address] [facility]  [facility]  Office  [phone]  CC: Primary care physician; [facility]

## 2017-06-15 NOTE — ED Notes (Signed)
Pt sleeping.  No seizure activity.  Sinus brady on monitor. Iv in place. Skin warm and dry.

## 2017-06-15 NOTE — ED Notes (Signed)
Report off to sylvia rn floor nurse

## 2017-06-15 NOTE — ED Provider Notes (Signed)
[facility] Emergency Department Provider Note  ____________________________________________   First MD Initiated Contact with Patient 06/15/17 1559     (approximate)  I have reviewed the triage vital signs and the nursing notes.   HISTORY  Chief Complaint Seizures   HPI [name] is a 66 y.o. female with a history of a seizure disorder as well as cirrhosis who is presenting to the emergency department today with 2 seizures.  Fire rescue was called by medical alert device.  Fire rescue found the patient in the bathroom on the floor.  Patient is extremely hard of hearing and signs and therefore a detailed history was unable to be obtained.  However, a seizure was witnessed by both fire rescue first responders as well as EMS.  EMS reports a seizure being a staring spell without generalized tonic-clonic activity that lasted 2-3 minutes.  Daughter is also at the bedside and says that the patient appears confused.  Patient unable to give further details at this time.  Past Medical History:  Diagnosis Date  . Asthma   . Cirrhosis, non-alcoholic ([place])   . COPD (chronic obstructive pulmonary disease) ([place])   . Deaf   . Depression   . Diabetes mellitus without complication ([place])   . GERD (gastroesophageal reflux disease)   . Heart murmur   . Hepatitis   . Hypertension   . Lymph node disorder    arm  . Neuropathy   . On home oxygen therapy    hs  . Orthopnea   . RLS (restless legs syndrome)   . Seizures ([place])   . Shortness of breath dyspnea   . Sleep apnea   . Stroke [facility])    tia    Patient Active Problem List   Diagnosis Date Noted  . Goals of care, counseling/discussion 05/06/2017  . Status epilepticus ([place])   . HCAP (healthcare-associated pneumonia)   . Community acquired pneumonia of left lower lobe of lung ([place])   . Hypoxia   . Acute on chronic respiratory failure with hypoxia ([place]) 03/24/2017  . Hypokalemia 02/12/2017  . Sepsis ([place])  02/11/2017  . GERD (gastroesophageal reflux disease) 03/23/2016  . Depression 03/23/2016  . Convulsions/seizures ([place]) 03/22/2016  . DM (diabetes mellitus), type 2 ([place]) 03/22/2016  . Essential hypertension 03/22/2016  . Neuropathy 03/22/2016  . Convulsions ([place]) 03/22/2016  . COPD exacerbation ([place]) 03/31/2015  . Acute respiratory failure with hypoxia ([place]) 03/29/2015    Past Surgical History:  Procedure Laterality Date  . CATARACT EXTRACTION W/PHACO Right 11/23/2014   Procedure: CATARACT EXTRACTION PHACO AND INTRAOCULAR LENS PLACEMENT (IOC);  Surgeon: Lyla Glassing, MD;  Location: [facility] ORS;  Service: Ophthalmology;  Laterality: Right;  . CATARACT EXTRACTION W/PHACO Left 12/14/2014   Procedure: CATARACT EXTRACTION PHACO AND INTRAOCULAR LENS PLACEMENT (IOC);  Surgeon: Lyla Glassing, MD;  Location: [facility] ORS;  Service: Ophthalmology;  Laterality: Left;  US:01:16.6 AP:15.8 CDE:12.14  . CESAREAN SECTION    . CHOLECYSTECTOMY    . KNEE ARTHROPLASTY    . THUMB ARTHROSCOPY    . TONSILLECTOMY    . TYMPANOPLASTY     muliple    Prior to Admission medications   Medication Sig Start Date End Date Taking? Authorizing Provider  albuterol (PROVENTIL HFA;VENTOLIN HFA) 108 (90 BASE) MCG/ACT inhaler Inhale 2 puffs into the lungs every 4 (four) hours as needed for wheezing or shortness of breath.    [provider]  albuterol (PROVENTIL) (2.5 MG/3ML) 0.083% nebulizer solution Take 3 mLs (2.5 mg  total) by nebulization every 4 (four) hours as needed for wheezing or shortness of breath. 07/17/16   Vaughan Basta, MD  citalopram (CELEXA) 20 MG tablet Take 1 tablet (20 mg total) by mouth daily. 07/17/16   Vaughan Basta, MD  clopidogrel (PLAVIX) 75 MG tablet Take 1 tablet (75 mg total) by mouth daily. 07/17/16   Vaughan Basta, MD  diltiazem (DILACOR XR) 180 MG 24 hr capsule Take 180 mg by mouth daily.    [provider]  divalproex (DEPAKOTE) 250 MG DR tablet Take  3 tablets (750 mg total) by mouth every 12 (twelve) hours. 05/07/17   Loletha Grayer, MD  furosemide (LASIX) 40 MG tablet Take 1 tablet (40 mg total) by mouth daily. Patient taking differently: Take 20 mg by mouth daily.  07/17/16   Vaughan Basta, MD  gabapentin (NEURONTIN) 300 MG capsule Take 1 capsule by mouth 2 (two) times daily.  01/16/16   [provider]  GLIPIZIDE XL 5 MG 24 hr tablet Take 1 tablet (5 mg total) by mouth daily. 05/07/17   Loletha Grayer, MD  lacosamide (VIMPAT) 200 MG TABS tablet Take 1 tablet (200 mg total) by mouth 2 (two) times daily. 05/07/17   Loletha Grayer, MD  levETIRAcetam (KEPPRA) 250 MG tablet Take 1 tablet (250 mg total) 2 (two) times daily by mouth. 05/25/17   Schaevitz, Randall An, MD  magnesium oxide (MAG-OX) 400 (241.3 Mg) MG tablet Take 1 tablet (400 mg total) by mouth daily. 05/07/17   Loletha Grayer, MD  mometasone-formoterol (DULERA) 200-5 MCG/ACT AERO Inhale 2 puffs into the lungs 2 (two) times daily. 07/17/16   Vaughan Basta, MD  montelukast (SINGULAIR) 10 MG tablet Take 1 tablet by mouth at bedtime. 03/18/16   [provider]  omeprazole (PRILOSEC) 20 MG capsule Take 20 mg by mouth daily.    [provider]  rOPINIRole (REQUIP) 0.25 MG tablet Take 1 tablet (0.25 mg total) by mouth 3 (three) times daily. 03/24/16   Oswald Hillock, MD  rosuvastatin (CRESTOR) 10 MG tablet Take 10 mg by mouth daily. 04/21/17   [provider]    Allergies Aspirin; Celebrex [celecoxib]; Ciprofloxacin; Codeine; Fosphenytoin; Levaquin [levofloxacin in d5w]; Levofloxacin; Lovastatin; Penicillins; Pravastatin; and Sulfa antibiotics  Family History  Problem Relation Age of Onset  . Lung cancer Mother   . CAD Father     Social History Social History   Tobacco Use  . Smoking status: Current Every Day Smoker    Packs/day: 0.50  . Smokeless tobacco: Never Used  Substance Use Topics  . Alcohol use: No  . Drug use:  No    Review of Systems  Level 5 caveat secondary to confusion.   ____________________________________________   PHYSICAL EXAM:  VITAL SIGNS: ED Triage Vitals  Enc Vitals Group     BP 06/15/17 1543 (!) 179/62     Pulse Rate 06/15/17 1543 (!) 58     Resp 06/15/17 1543 (!) 33     Temp 06/15/17 1543 98.3 F (36.8 C)     Temp Source 06/15/17 1543 Oral     SpO2 06/15/17 1543 92 %     Weight 06/15/17 1541 171 lb (77.6 kg)     Height 06/15/17 1541 _0  (1.575 m)     Head Circumference --      Peak Flow --      Pain Score --      Pain Loc --      Pain Edu? --  Excl. in [place]? --     Constitutional: Alert with eyes open.  in no acute distress. Eyes: Conjunctivae are normal.  Head: Atraumatic. Nose: No congestion/rhinnorhea. Mouth/Throat: Mucous membranes are moist.  Neck: No stridor.  Patient brought in c-collar. Cardiovascular: Normal rate, regular rhythm. Grossly normal heart sounds.   Respiratory: Normal respiratory effort.  No retractions. Lungs CTAB. Gastrointestinal: Soft and nontender. No distention. Musculoskeletal: No lower extremity tenderness nor edema.  No joint effusions. Neurologic:   No gross focal neurologic deficits are appreciated. Skin:  Skin is warm, dry and intact. No rash noted.   ____________________________________________   LABS (all labs ordered are listed, but only abnormal results are displayed)  Labs Reviewed  CBC WITH DIFFERENTIAL/PLATELET - Abnormal; Notable for the following components:      Result Value   RDW 14.6 (*)    All other components within normal limits  COMPREHENSIVE METABOLIC PANEL - Abnormal; Notable for the following components:   Potassium 2.4 (*)    Chloride 98 (*)    Glucose, Bld 199 (*)    Albumin 3.2 (*)    Alkaline Phosphatase 160 (*)    All other components within normal limits  TROPONIN I  URINALYSIS, COMPLETE (UACMP) WITH MICROSCOPIC   ____________________________________________  EKG  ED ECG  REPORT I, Doran Stabler, the attending physician, personally viewed and interpreted this ECG.   Date: 06/15/2017  EKG Time: 1550  Rate: 59  Rhythm: sinus bradycardia  Axis: Normal  Intervals:none  ST&T Change: T wave inversions in V2 and V3 as well as V4 and V5.  No significant change from previous.  No ST elevations or depressions  ____________________________________________  RADIOLOGY  No acute finding on the CT cervical spine with a CT of the brain ____________________________________________   PROCEDURES  Procedure(s) performed:   Procedures  Critical Care performed:   ____________________________________________   INITIAL IMPRESSION / ASSESSMENT AND PLAN / ED COURSE  Pertinent labs & imaging results that were available during my care of the patient were reviewed by me and considered in my medical decision making (see chart for details).  DDX: Hypokalemia, breakthrough seizure, postictal state As part of my medical decision making, I reviewed the following data within the [place] chart reviewed  ----------------------------------------- 6:10 PM on 06/15/2017 -----------------------------------------  Patient's daughter says that the patient has been persistently confused throughout the visit.  Patient will be admitted to the hospital for prolonged postictal state.  Patient given Ativan early on the visit secondary to patient having what appeared to be staring episode/seizure.  Also loaded with Keppra.  Able to clear cervical collar after reassuring scans.  Signed out to Dr. Posey Pronto.      ____________________________________________   FINAL CLINICAL IMPRESSION(S) / ED DIAGNOSES  Hypokalemia.  Prolonged postictal state.  Seizure.    NEW MEDICATIONS STARTED DURING THIS VISIT:  This SmartLink is deprecated. Use AVSMEDLIST instead to display the medication list for a patient.   Note:  This document was prepared using Dragon voice  recognition software and may include unintentional dictation errors.     [name], MD 06/15/17 [phone]

## 2017-06-16 ENCOUNTER — Inpatient Hospital Stay: Payer: Medicare HMO

## 2017-06-16 ENCOUNTER — Other Ambulatory Visit: Payer: Self-pay

## 2017-06-16 DIAGNOSIS — R569 Unspecified convulsions: Secondary | ICD-10-CM

## 2017-06-16 LAB — C DIFFICILE QUICK SCREEN W PCR REFLEX
C Diff antigen: POSITIVE — AB
C Diff toxin: NEGATIVE

## 2017-06-16 LAB — URINALYSIS, COMPLETE (UACMP) WITH MICROSCOPIC
Bilirubin Urine: NEGATIVE
Glucose, UA: NEGATIVE mg/dL
Hgb urine dipstick: NEGATIVE
Ketones, ur: NEGATIVE mg/dL
Nitrite: NEGATIVE
Protein, ur: NEGATIVE mg/dL
SPECIFIC GRAVITY, URINE: 1.016 (ref 1.005–1.030)
pH: 6 (ref 5.0–8.0)

## 2017-06-16 LAB — GASTROINTESTINAL PANEL BY PCR, STOOL (REPLACES STOOL CULTURE)
ADENOVIRUS F40/41: NOT DETECTED
ASTROVIRUS: NOT DETECTED
CAMPYLOBACTER SPECIES: NOT DETECTED
CRYPTOSPORIDIUM: NOT DETECTED
Cyclospora cayetanensis: NOT DETECTED
ENTEROAGGREGATIVE E COLI (EAEC): NOT DETECTED
ENTEROPATHOGENIC E COLI (EPEC): NOT DETECTED
ENTEROTOXIGENIC E COLI (ETEC): NOT DETECTED
Entamoeba histolytica: NOT DETECTED
Giardia lamblia: NOT DETECTED
Norovirus GI/GII: NOT DETECTED
PLESIMONAS SHIGELLOIDES: NOT DETECTED
Rotavirus A: NOT DETECTED
SHIGA LIKE TOXIN PRODUCING E COLI (STEC): NOT DETECTED
Salmonella species: NOT DETECTED
Sapovirus (I, II, IV, and V): NOT DETECTED
Shigella/Enteroinvasive E coli (EIEC): NOT DETECTED
VIBRIO CHOLERAE: NOT DETECTED
VIBRIO SPECIES: NOT DETECTED
Yersinia enterocolitica: NOT DETECTED

## 2017-06-16 LAB — VALPROIC ACID LEVEL: Valproic Acid Lvl: 15 ug/mL — ABNORMAL LOW (ref 50.0–100.0)

## 2017-06-16 LAB — MRSA PCR SCREENING: MRSA BY PCR: POSITIVE — AB

## 2017-06-16 LAB — GLUCOSE, CAPILLARY
Glucose-Capillary: 155 mg/dL — ABNORMAL HIGH (ref 65–99)
Glucose-Capillary: 150 mg/dL — ABNORMAL HIGH (ref 65–99)
Glucose-Capillary: 146 mg/dL — ABNORMAL HIGH (ref 65–99)

## 2017-06-16 LAB — POTASSIUM: Potassium: 3.3 mmol/L — ABNORMAL LOW (ref 3.5–5.1)

## 2017-06-16 LAB — MAGNESIUM: MAGNESIUM: 1.5 mg/dL — AB (ref 1.7–2.4)

## 2017-06-16 MED ORDER — MAGNESIUM SULFATE 4 GM/100ML IV SOLN
4.0000 g | Freq: Once | INTRAVENOUS | Status: AC
  Administered 2017-06-16: 20:00:00 4 g via INTRAVENOUS
  Filled 2017-06-16: qty 100

## 2017-06-16 MED ORDER — DIVALPROEX SODIUM 250 MG PO DR TAB
250.0000 mg | DELAYED_RELEASE_TABLET | Freq: Two times a day (BID) | ORAL | Status: DC
  Administered 2017-06-16 – 2017-06-17 (×2): 250 mg via ORAL
  Filled 2017-06-16 (×3): qty 1

## 2017-06-16 MED ORDER — OXYCODONE HCL 5 MG PO TABS: 5.0000 mg | ORAL_TABLET | Freq: Four times a day (QID) | ORAL | Status: DC | PRN

## 2017-06-16 MED ORDER — MUPIROCIN 2 % EX OINT
1.0000 "application " | TOPICAL_OINTMENT | Freq: Two times a day (BID) | CUTANEOUS | Status: DC
  Administered 2017-06-16 – 2017-06-17 (×2): 1 via NASAL
  Filled 2017-06-16: qty 22

## 2017-06-16 MED ORDER — POTASSIUM CHLORIDE 10 MEQ/100ML IV SOLN
10.0000 meq | INTRAVENOUS | Status: DC
  Administered 2017-06-16 (×3): 10 meq via INTRAVENOUS
  Filled 2017-06-16: qty 100

## 2017-06-16 MED ORDER — CHLORHEXIDINE GLUCONATE CLOTH 2 % EX PADS
6.0000 | MEDICATED_PAD | Freq: Every day | CUTANEOUS | Status: DC
  Administered 2017-06-17: 05:00:00 6 via TOPICAL

## 2017-06-16 MED ORDER — MONTELUKAST SODIUM 10 MG PO TABS
10.0000 mg | ORAL_TABLET | Freq: Every day | ORAL | Status: DC
  Administered 2017-06-16: 23:00:00 10 mg via ORAL

## 2017-06-16 MED ORDER — INSULIN ASPART 100 UNIT/ML ~~LOC~~ SOLN: 0.0000 [IU] | Freq: Every day | SUBCUTANEOUS | Status: DC

## 2017-06-16 MED ORDER — CITALOPRAM HYDROBROMIDE 20 MG PO TABS
20.0000 mg | ORAL_TABLET | Freq: Every day | ORAL | Status: DC
  Administered 2017-06-16 – 2017-06-17 (×2): 20 mg via ORAL
  Filled 2017-06-16 (×2): qty 1

## 2017-06-16 MED ORDER — INSULIN ASPART 100 UNIT/ML ~~LOC~~ SOLN
0.0000 [IU] | Freq: Three times a day (TID) | SUBCUTANEOUS | Status: DC
  Administered 2017-06-16: 18:00:00 1 [IU] via SUBCUTANEOUS
  Administered 2017-06-16: 2 [IU] via SUBCUTANEOUS
  Administered 2017-06-17: 1 [IU] via SUBCUTANEOUS
  Administered 2017-06-17: 12:00:00 2 [IU] via SUBCUTANEOUS
  Filled 2017-06-16 (×3): qty 1

## 2017-06-16 MED ORDER — LEVETIRACETAM 250 MG PO TABS
250.0000 mg | ORAL_TABLET | Freq: Two times a day (BID) | ORAL | Status: DC
  Administered 2017-06-16 – 2017-06-17 (×2): 250 mg via ORAL
  Filled 2017-06-16 (×3): qty 1

## 2017-06-16 MED ORDER — MOMETASONE FURO-FORMOTEROL FUM 200-5 MCG/ACT IN AERO
2.0000 | INHALATION_SPRAY | Freq: Two times a day (BID) | RESPIRATORY_TRACT | Status: DC
  Administered 2017-06-16 – 2017-06-17 (×2): 2 via RESPIRATORY_TRACT
  Filled 2017-06-16: qty 8.8

## 2017-06-16 MED ORDER — MAGNESIUM OXIDE 400 (241.3 MG) MG PO TABS
400.0000 mg | ORAL_TABLET | Freq: Every day | ORAL | Status: DC
  Administered 2017-06-16 – 2017-06-17 (×2): 400 mg via ORAL
  Filled 2017-06-16 (×2): qty 1

## 2017-06-16 MED ORDER — POTASSIUM CHLORIDE 10 MEQ/100ML IV SOLN: 10.0000 meq | INTRAVENOUS | Status: DC

## 2017-06-16 MED ORDER — POTASSIUM CHLORIDE CRYS ER 20 MEQ PO TBCR
20.0000 meq | EXTENDED_RELEASE_TABLET | Freq: Once | ORAL | Status: AC
  Administered 2017-06-16: 14:00:00 20 meq via ORAL
  Filled 2017-06-16: qty 1

## 2017-06-16 MED ORDER — ENSURE ENLIVE PO LIQD: 237.0000 mL | Freq: Two times a day (BID) | ORAL | Status: DC

## 2017-06-16 MED ORDER — DILTIAZEM HCL ER COATED BEADS 180 MG PO CP24
180.0000 mg | ORAL_CAPSULE | Freq: Every day | ORAL | Status: DC
  Administered 2017-06-16 – 2017-06-17 (×2): 180 mg via ORAL
  Filled 2017-06-16 (×2): qty 1

## 2017-06-16 MED ORDER — ROPINIROLE HCL 0.25 MG PO TABS
0.2500 mg | ORAL_TABLET | Freq: Three times a day (TID) | ORAL | Status: DC
  Administered 2017-06-16 – 2017-06-17 (×2): 0.25 mg via ORAL
  Filled 2017-06-16 (×5): qty 1

## 2017-06-16 MED ORDER — POTASSIUM CHLORIDE 10 MEQ/100ML IV SOLN
10.0000 meq | INTRAVENOUS | Status: AC
  Administered 2017-06-16 (×2): 10 meq via INTRAVENOUS

## 2017-06-16 MED ORDER — OXCARBAZEPINE 150 MG PO TABS
150.0000 mg | ORAL_TABLET | Freq: Two times a day (BID) | ORAL | Status: DC
  Administered 2017-06-16 – 2017-06-17 (×3): 150 mg via ORAL
  Filled 2017-06-16 (×4): qty 1

## 2017-06-16 MED ORDER — CLOPIDOGREL BISULFATE 75 MG PO TABS
75.0000 mg | ORAL_TABLET | Freq: Every day | ORAL | Status: DC
  Administered 2017-06-16 – 2017-06-17 (×2): 75 mg via ORAL
  Filled 2017-06-16 (×2): qty 1

## 2017-06-16 NOTE — Progress Notes (Signed)
[facility] [place] NAME: [name]    MR#:  [id number]  DATE OF BIRTH:  Nov 08, 1950  SUBJECTIVE:  CHIEF COMPLAINT:   Chief Complaint  Patient presents with  . Seizures   Brought after likely syncopal episode vs seizures. Fully alert and oriented today, she is deaf and uses sign language, her daughter helps to facilitate communication, pt also understands lip reading.  REVIEW OF SYSTEMS:  CONSTITUTIONAL: No fever, fatigue or weakness.  EYES: No blurred or double vision.  EARS, NOSE, AND THROAT: No tinnitus or ear pain.  RESPIRATORY: No cough, shortness of breath, wheezing or hemoptysis.  CARDIOVASCULAR: No chest pain, orthopnea, edema.  GASTROINTESTINAL: No nausea, vomiting, diarrhea or abdominal pain.  GENITOURINARY: No dysuria, hematuria.  ENDOCRINE: No polyuria, nocturia,  HEMATOLOGY: No anemia, easy bruising or bleeding SKIN: No rash or lesion. MUSCULOSKELETAL: No joint pain or arthritis.   NEUROLOGIC: No tingling, numbness, weakness.  PSYCHIATRY: No anxiety or depression.   ROS  DRUG ALLERGIES:   Allergies  Allergen Reactions  . Aspirin Itching  . Celebrex [Celecoxib] Itching    itching  . Ciprofloxacin Itching  . Codeine Itching  . Fosphenytoin Itching  . Levaquin [Levofloxacin In D5w] Itching  . Levofloxacin Itching  . Lovastatin Itching  . Penicillins     Documentation indicates severe reaction  Pt tolerated cephalosporin without adverse reaction 09/18   . Pravastatin Itching  . Sulfa Antibiotics Itching    VITALS:  Blood pressure (!) 174/64, pulse (!) 59, temperature 98.3 F (36.8 C), temperature source Oral, resp. rate 18, height _0  (1.575 m), weight 77.6 kg (171 lb), SpO2 95 %.  PHYSICAL EXAMINATION:  GENERAL:  66 y.o.-year-old patient lying in the bed with no acute distress.  EYES: Pupils equal, round, reactive to light and accommodation. No scleral icterus. Extraocular muscles intact.  HEENT:  Head atraumatic, normocephalic. Oropharynx and nasopharynx clear.  NECK:  Supple, no jugular venous distention. No thyroid enlargement, no tenderness.  LUNGS: Normal breath sounds bilaterally, no wheezing, rales,rhonchi or crepitation. No use of accessory muscles of respiration.  CARDIOVASCULAR: S1, S2 normal. No murmurs, rubs, or gallops.  ABDOMEN: Soft, nontender, nondistended. Bowel sounds present. No organomegaly or mass.  EXTREMITIES: No pedal edema, cyanosis, or clubbing.  NEUROLOGIC: Cranial nerves II through XII are intact. Muscle strength 4-5/5 in all extremities. Sensation intact. Gait not checked.  PSYCHIATRIC: The patient is alert and oriented x 3.  SKIN: No obvious rash, lesion, or ulcer.   Physical Exam LABORATORY PANEL:   CBC Recent Labs  Lab 06/15/17 1545  WBC 5.7  HGB 14.9  HCT 42.1  PLT 191   ------------------------------------------------------------------------------------------------------------------  Chemistries  Recent Labs  Lab 06/15/17 1545 06/16/17 1303  NA 140  --   K 2.4* 3.3*  CL 98*  --   CO2 31  --   GLUCOSE 199*  --   BUN 13  --   CREATININE 0.53  --   CALCIUM 9.0  --   MG  --  1.5*  AST 32  --   ALT 14  --   ALKPHOS 160*  --   BILITOT 0.6  --    ------------------------------------------------------------------------------------------------------------------  Cardiac Enzymes Recent Labs  Lab 06/15/17 1545  TROPONINI <0.03   ------------------------------------------------------------------------------------------------------------------  RADIOLOGY:  Ct Head Wo Contrast  Result Date: 06/15/2017 CLINICAL DATA:  Seizure.  Found down. EXAM: CT HEAD WITHOUT CONTRAST CT CERVICAL SPINE WITHOUT CONTRAST TECHNIQUE: Multidetector CT imaging of the head and  cervical spine was performed following the standard protocol without intravenous contrast. Multiplanar CT image reconstructions of the cervical spine were also generated. COMPARISON:   05/22/2017 FINDINGS: CT HEAD FINDINGS Brain: There is no evidence for acute hemorrhage, hydrocephalus, mass lesion, or abnormal extra-axial fluid collection. No definite CT evidence for acute infarction. Patchy low attenuation in the deep hemispheric and periventricular white matter is nonspecific, but likely reflects chronic microvascular ischemic demyelination. Vascular: No hyperdense vessel or unexpected calcification. Skull: No evidence for fracture. No worrisome lytic or sclerotic lesion. Sinuses/Orbits: Status post left mastoid surgery. The visualized paranasal sinuses and mastoid air cells are clear. Visualized portions of the globes and intraorbital fat are unremarkable. CT CERVICAL SPINE FINDINGS Alignment: Normal. Skull base and vertebrae: No acute fracture. No primary bone lesion or focal pathologic process. Soft tissues and spinal canal: No prevertebral fluid or swelling. No visible canal hematoma. Disc levels: Loss of disc height at C5-6 with endplate degeneration. Upper chest: 9 mm short axis high right paraesophageal lymph node is upper normal for size. Emphysema noted in the lung apices. Other: None. IMPRESSION: 1. No acute intracranial abnormality. 2. Chronic microvascular white matter ischemic disease. 3. Degenerative changes at C5-6 without cervical spine fracture. Electronically Signed   By: Misty Stanley M.D.   On: 06/15/2017 16:40   Ct Cervical Spine Wo Contrast  Result Date: 06/15/2017 CLINICAL DATA:  Seizure.  Found down. EXAM: CT HEAD WITHOUT CONTRAST CT CERVICAL SPINE WITHOUT CONTRAST TECHNIQUE: Multidetector CT imaging of the head and cervical spine was performed following the standard protocol without intravenous contrast. Multiplanar CT image reconstructions of the cervical spine were also generated. COMPARISON:  05/22/2017 FINDINGS: CT HEAD FINDINGS Brain: There is no evidence for acute hemorrhage, hydrocephalus, mass lesion, or abnormal extra-axial fluid collection. No definite  CT evidence for acute infarction. Patchy low attenuation in the deep hemispheric and periventricular white matter is nonspecific, but likely reflects chronic microvascular ischemic demyelination. Vascular: No hyperdense vessel or unexpected calcification. Skull: No evidence for fracture. No worrisome lytic or sclerotic lesion. Sinuses/Orbits: Status post left mastoid surgery. The visualized paranasal sinuses and mastoid air cells are clear. Visualized portions of the globes and intraorbital fat are unremarkable. CT CERVICAL SPINE FINDINGS Alignment: Normal. Skull base and vertebrae: No acute fracture. No primary bone lesion or focal pathologic process. Soft tissues and spinal canal: No prevertebral fluid or swelling. No visible canal hematoma. Disc levels: Loss of disc height at C5-6 with endplate degeneration. Upper chest: 9 mm short axis high right paraesophageal lymph node is upper normal for size. Emphysema noted in the lung apices. Other: None. IMPRESSION: 1. No acute intracranial abnormality. 2. Chronic microvascular white matter ischemic disease. 3. Degenerative changes at C5-6 without cervical spine fracture. Electronically Signed   By: Misty Stanley M.D.   On: 06/15/2017 16:40    ASSESSMENT AND PLAN:   Active Problems:   Seizures ([place])  [name]  is a 66 y.o. female with a known history of seizures, nonalcoholic cirrhosis, diabetes comes to the emergency room brought by EMS after neighbors found patient unresponsive in her bathroom at home.  Patient currently sedated after she received antiseizure meds in the emergency room.  1.  Acute metabolic encephalopathy with suspected seizures at home -Was found in the bathroom by neighbors brought to the emergency room she received a loading dose of IV Keppra thousand milligrams and 3 mg of IV Ativan. -We will continue seizure precaution  Appreciated neurology help.  Start Trileptal 144m BID.  First  dose today.  Would continue Keppra through  tomorrow then discontinue.    Decrease Depakote to 222m BID   Continue Vimpat at current dose   Depakote level   Ativan prn seizures  -CT head and CT cervical spine negative -Continue IV fluids  2.  Hypokalemia   replete with IV potassium   Check magnesium.  3.  Diabetes sliding scale for now  4.  DVT prophylaxis subcu Lovenox    All the records are reviewed and case discussed with Care Management/Social Workerr. Management plans discussed with the patient, family and they are in agreement.  CODE STATUS: Full  TOTAL TIME TAKING CARE OF THIS PATIENT: 35 minutes.     POSSIBLE D/C IN 1-2 DAYS, DEPENDING ON CLINICAL CONDITION.   VVaughan BastaM.D on 06/16/2017   Between 7am to 6pm - Pager - [phone] After 6pm go to [email or web address] - password [email or web address]  Office  [phone] CC: Primary care physician; [facility] Note: This dictation was prepared with Dragon dictation along with smaller phrase technology. Any transcriptional errors that result from this process are unintentional.

## 2017-06-16 NOTE — Progress Notes (Signed)
   06/16/17 0824  PT Visit Information  Last PT Received On 06/16/17  Reason Eval/Treat Not Completed Medical issues which prohibited therapy (Potassium 2.4, will hold PT eval until labs are more stable and patient is appropriate for PT; )  Norwood Levo. Barnet Glasgow PT, DPT

## 2017-06-16 NOTE — Progress Notes (Addendum)
Inpatient Diabetes Program Recommendations  AACE/ADA: New Consensus Statement on Inpatient Glycemic Control (2015)  Target Ranges:  Prepandial:   less than 140 mg/dL      Peak postprandial:   less than 180 mg/dL (1-2 hours)      Critically ill patients:  140 - 180 mg/dL   Results for [name], [name] (MRN [id number]) as of 06/16/2017 11:01  Ref. Range 06/15/2017 15:45  Glucose Latest Ref Range: 65 - 99 mg/dL 199 (H)   Review of Glycemic Control  Diabetes history: DM2 Outpatient Diabetes medications: Glipizide XL 5 mg daily Current orders for Inpatient glycemic control: None  Inpatient Diabetes Program Recommendations: Correction (SSI): While inpatient, please consider ordering CBGs with Novolog 0-9 units TID with meals and Novolog 0-5 units QHS.  Thanks, Barnie Alderman, RN, MSN, CDE Diabetes Coordinator Inpatient Diabetes Program [phone] (Team Pager from 8am to 5pm)

## 2017-06-16 NOTE — Progress Notes (Signed)
PT is recommending home health. Clinical Education officer, museum (CSW) sent RN case Freight forwarder a message making her aware of above. Please reconsult if future social work needs arise. CSW signing off.   [employer], LCSW [phone]

## 2017-06-16 NOTE — Progress Notes (Signed)
Initial Nutrition Assessment  DOCUMENTATION CODES:   Obesity unspecified  INTERVENTION:  Provide Ensure Enlive po BID, each supplement provides 350 kcal and 20 grams of protein.  Encouraged adequate intake of calories and protein at meals.  NUTRITION DIAGNOSIS:   Inadequate oral intake related to decreased appetite as evidenced by per patient/family report.  GOAL:   Patient will meet greater than or equal to 90% of their needs  MONITOR:   PO intake, Supplement acceptance, Labs, Weight trends, Skin, I & O's  REASON FOR ASSESSMENT:   Malnutrition Screening Tool    ASSESSMENT:   66 year old female with PMHx of asthma, COPD, sleep apnea, HTN, seizures, neuropathy, non-alcoholic cirrhosis, hx TIA, hepatitis, GERD, deafness, depression, DM type 2 who was brought in after being found unresponsive at home found to have acute metabolic encephalopathy with suspected seizure at home.   -C. Difficile antigen was positive, but toxin was negative. MRSA positive.  Met with patient and her daughter at bedside. Patient is deaf and communicates through sign language. She must be able to read lips because she always knew what this RD was saying/asking without having to look at her daughter. She wanted her daughter to translate for assessment. Patient reports she has had a decreased appetite for a while and she is unsure why. She does endorse diarrhea, but denies any N/V, abdominal pain, or difficulty chewing/swallowing. She reports she only eats 2 small meals a day now.  Patient reports her UBW is 165 lbs. She is unsure if she is losing weight. Unsure of accuracy of current weight, but she is actually above her reported UBW. Per chart she was 171.2 lbs on 05/07/2017.  Medications reviewed and include: Colace, Novolog 0-5 units QHS, Novolog 0-9 units TID, Keppra, magnesium oxide 400 mg daily, NS @ 75 mL/hr, magnesium sulfate 4 grams IV once today.  Labs reviewed: CBG 155, Potassium 3.3 (trending  up from 2.4 on 12/10), Magnesium 1.5.  Patient does not meet criteria for malnutrition at this time.  NUTRITION - FOCUSED PHYSICAL EXAM:    Most Recent Value  Orbital Region  No depletion  Upper Arm Region  Mild depletion  Thoracic and Lumbar Region  No depletion  Buccal Region  No depletion  Temple Region  Mild depletion  Clavicle Bone Region  No depletion  Clavicle and Acromion Bone Region  No depletion  Scapular Bone Region  No depletion  Dorsal Hand  No depletion  Patellar Region  No depletion  Anterior Thigh Region  No depletion  Posterior Calf Region  No depletion  Edema (RD Assessment)  None  Hair  Reviewed  Eyes  Reviewed  Mouth  Reviewed  Skin  Reviewed  Nails  Reviewed     Diet Order:  Diet 2 gram sodium Room service appropriate? Yes; Fluid consistency: Thin  EDUCATION NEEDS:   No education needs have been identified at this time  Skin:  Skin Assessment: Reviewed RN Assessment  Last BM:  06/16/2017 - medium type 6  Height:   Ht Readings from Last 1 Encounters:  06/15/17 _0  (1.575 m)    Weight:   Wt Readings from Last 1 Encounters:  06/15/17 171 lb (77.6 kg)    Ideal Body Weight:  50 kg  BMI:  Body mass index is 31.28 kg/m.  Estimated Nutritional Needs:   Kcal:  1535-1790 (MSJ x 1.2-1.4)  Protein:  78-93 grams (1-1.2 grams/kg)  Fluid:  1.5-1.75 L/day (30-35 mL/kg IBW)  Willey Blade, MS, RD, LDN Office:  [phone] Pager: [phone] After Hours/Weekend Pager: [phone]

## 2017-06-16 NOTE — Evaluation (Signed)
Physical Therapy Evaluation Patient Details Name: [name] MRN: [id number] DOB: 08-Nov-1950 Today's Date: 06/16/2017   History of Present Illness  Pt is a 66 y/o F who was found unresponsive in her bathroom at home.  Pt with witnessed seizures by EMS and pt received antiseizure medication in ED.  Pt admitted for metabolic encephalopathy.  CT head and CT cervical spine negative.  Pt's PMH includes cirrhosis, COPD, deaf, neuropathy, seizures, TIA, TKA.     Clinical Impression  Pt admitted with above diagnosis. Pt currently with functional limitations due to the deficits listed below (see PT Problem List). [name] was very pleasant and eager for OOB activity.  She currently is independent with bed mobility. Supervision and cues for safe technique for sit>stand transfers.  Supervision while ambulating in room with RW, SpO2 ranging from 86-91% on RA so [place] O2 reapplied at 2L with improvement to 93%. RN notified.  Pt's daughter will be staying with the patient for several days at d/c.  Family has been assisting with cooking, cleaning.  Per daughter the pt has had challenges with bathing, dressing but is able to do these independently.  Recommend OT Eval for ADLs and energy conservation techniques.  Pt will benefit from skilled PT to increase their independence and safety with mobility to allow discharge to the venue listed below.      Follow Up Recommendations Home health PT;Supervision/Assistance - 24 hour    Equipment Recommendations  None recommended by PT    Recommendations for Other Services OT consult     Precautions / Restrictions Precautions Precautions: Fall;Other (comment) Precaution Comments: O2 Restrictions Weight Bearing Restrictions: No      Mobility  Bed Mobility Overal bed mobility: Independent             General bed mobility comments: No physical assist or cues needed.  Pt performs independently.   Transfers Overall transfer level: Needs  assistance Equipment used: Rolling walker (2 wheeled) Transfers: Sit to/from Stand Sit to Stand: Supervision         General transfer comment: Supervision for safety with cues for proper hand placement.  No signs of instability.   Ambulation/Gait Ambulation/Gait assistance: Supervision Ambulation Distance (Feet): 50 Feet Assistive device: Rolling walker (2 wheeled) Gait Pattern/deviations: Step-through pattern;Decreased stride length Gait velocity: decreased Gait velocity interpretation: Below normal speed for age/gender General Gait Details: Limited to room as pt with enteric precautions.  Pt with slow but steady gait.  No signs of instability. SpO2 ranging from 86-91% on RA so [place] O2 reapplied at 2L with improvement to 93%.   Stairs            Wheelchair Mobility    Modified Rankin (Stroke Patients Only)       Balance Overall balance assessment: Needs assistance;History of Falls Sitting-balance support: No upper extremity supported;Feet supported Sitting balance-Leahy Scale: Good     Standing balance support: No upper extremity supported;During functional activity Standing balance-Leahy Scale: Fair Standing balance comment: Pt able to stand statically without UE support but relies on UE support for dynamic activities                             Pertinent Vitals/Pain Pain Assessment: Faces Faces Pain Scale: No hurt Pain Location: no signs of pain Pain Intervention(s): Limited activity within patient's tolerance;Monitored during session    [place] expects to be discharged to:: Private residence Living Arrangements: Alone Available Help at Discharge:  Family;Available 24 hours/day(Daughter to stay with pt for first few days after d/c) Type of Home: Apartment Home Access: Level entry     Home Layout: One level Home Equipment: Grab bars - tub/shower;Grab bars - toilet;Walker - 4 wheels;Wheelchair - manual;Cane - single point;Shower  seat      Prior Function Level of Independence: Needs assistance   Gait / Transfers Assistance Needed: Pt ambualting with rollator at all times.  Has had many falls since last admission which her MD says is due to her medication.  Plan is to have these adjusted to prevent dizziness and falls.    ADL's / Homemaking Assistance Needed: Pt requires assist with cooking, cleaning.  Independent with bathing, dressing.         Hand Dominance        Extremity/Trunk Assessment   Upper Extremity Assessment Upper Extremity Assessment: (BUE strength grossly 4/5)    Lower Extremity Assessment Lower Extremity Assessment: (BLE strength grossly 3+/5)    Cervical / Trunk Assessment Cervical / Trunk Assessment: Normal  Communication   Communication: Deaf(pt an excellent lip reader)  Cognition Arousal/Alertness: Awake/alert Behavior During Therapy: WFL for tasks assessed/performed Overall Cognitive Status: Within Functional Limits for tasks assessed                                        General Comments General comments (skin integrity, edema, etc.): Daughter present during Eval    Exercises Other Exercises Other Exercises: Mini squats with BUEs supported on countertop x10 Other Exercises: Marching in place with BUEs supported on countertop x10 each LE   Assessment/Plan    PT Assessment Patient needs continued PT services  PT Problem List Decreased strength;Decreased activity tolerance;Decreased balance;Decreased knowledge of use of DME;Decreased safety awareness;Cardiopulmonary status limiting activity       PT Treatment Interventions DME instruction;Gait training;Functional mobility training;Therapeutic activities;Therapeutic exercise;Balance training;Neuromuscular re-education;Patient/family education    PT Goals (Current goals can be found in the Care Plan section)  Acute Rehab PT Goals Patient Stated Goal: to decrease falling episodes  PT Goal Formulation:  With patient Time For Goal Achievement: 06/30/17 Potential to Achieve Goals: Good    Frequency Min 2X/week   Barriers to discharge        Co-evaluation               AM-PAC PT "6 Clicks" Daily Activity  Outcome Measure Difficulty turning over in bed (including adjusting bedclothes, sheets and blankets)?: None Difficulty moving from lying on back to sitting on the side of the bed? : None Difficulty sitting down on and standing up from a chair with arms (e.g., wheelchair, bedside commode, etc,.)?: A Little Help needed moving to and from a bed to chair (including a wheelchair)?: A Little Help needed walking in hospital room?: A Little Help needed climbing 3-5 steps with a railing? : A Little 6 Click Score: 20    End of Session Equipment Utilized During Treatment: Gait belt;Oxygen Activity Tolerance: Patient tolerated treatment well Patient left: in bed;with call bell/phone within reach;with bed alarm set Nurse Communication: Mobility status;Other (comment)(SpO2) PT Visit Diagnosis: Unsteadiness on feet (R26.81);Muscle weakness (generalized) (M62.81);Other abnormalities of gait and mobility (R26.89);History of falling (Z91.81)    Time: 4081-4481 PT Time Calculation (min) (ACUTE ONLY): 41 min   Charges:   PT Evaluation $PT Eval Low Complexity: 1 Low PT Treatments $Gait Training: 8-22 mins $Therapeutic Activity: 8-22 mins  PT G Codes:   PT G-Codes **NOT FOR INPATIENT CLASS** Functional Assessment Tool Used: AM-PAC 6 Clicks Basic Mobility;Clinical judgement Functional Limitation: Mobility: Walking and moving around Mobility: Walking and Moving Around Current Status (I3382): At least 20 percent but less than 40 percent impaired, limited or restricted Mobility: Walking and Moving Around Goal Status [phone]): At least 1 percent but less than 20 percent impaired, limited or restricted    Collie Siad PT, DPT 06/16/2017, 3:19 PM

## 2017-06-16 NOTE — Consult Note (Signed)
Reason for Consult:Seizures Referring Physician: Anselm Jungling  CC: Seizures  HPI: [name] is an 66 y.o. female with a history of a seizure disorder as well as cirrhosis who is presented to the emergency department yesterday after 2 seizures.  Fire rescue was called by medical alert device.  Per patient report she was in the bathroom and became dizzy.  Legs buckled under her and she fell.  Fire rescue found the patient in the bathroom on the floor.  A seizure was witnessed by both fire rescue first responders as well as EMS.  EMS reported a seizure being a staring spell without generalized tonic-clonic activity that lasted 2-3 minutes.  Daughter was at the bedside in the ED and reported that the patient appeared confused.   Patient seen in the ED in November with a breakthrough seizure and what was felt to be Depakote side effects.  They report improvement improvement in her gait since change in dosage but visual symptoms have continued.  Keppra was started at a low dose.  Causes headaches in the past on a higher dose and the headaches have returned.    Past Medical History:  Diagnosis Date  . Asthma   . Cirrhosis, non-alcoholic ([place])   . COPD (chronic obstructive pulmonary disease) ([place])   . Deaf   . Depression   . Diabetes mellitus without complication ([place])   . GERD (gastroesophageal reflux disease)   . Heart murmur   . Hepatitis   . Hypertension   . Lymph node disorder    arm  . Neuropathy   . On home oxygen therapy    hs  . Orthopnea   . RLS (restless legs syndrome)   . Seizures ([place])   . Shortness of breath dyspnea   . Sleep apnea   . Stroke [facility])    tia    Past Surgical History:  Procedure Laterality Date  . CATARACT EXTRACTION W/PHACO Right 11/23/2014   Procedure: CATARACT EXTRACTION PHACO AND INTRAOCULAR LENS PLACEMENT (IOC);  Surgeon: Lyla Glassing, MD;  Location: [facility] ORS;  Service: Ophthalmology;  Laterality: Right;  . CATARACT EXTRACTION W/PHACO Left 12/14/2014    Procedure: CATARACT EXTRACTION PHACO AND INTRAOCULAR LENS PLACEMENT (IOC);  Surgeon: Lyla Glassing, MD;  Location: [facility] ORS;  Service: Ophthalmology;  Laterality: Left;  US:01:16.6 AP:15.8 CDE:12.14  . CESAREAN SECTION    . CHOLECYSTECTOMY    . KNEE ARTHROPLASTY    . THUMB ARTHROSCOPY    . TONSILLECTOMY    . TYMPANOPLASTY     muliple    Family History  Problem Relation Age of Onset  . Lung cancer Mother   . CAD Father     Social History:  reports that she has been smoking.  She has been smoking about 0.50 packs per day. she has never used smokeless tobacco. She reports that she does not drink alcohol or use drugs.  Allergies  Allergen Reactions  . Aspirin Itching  . Celebrex [Celecoxib] Itching    itching  . Ciprofloxacin Itching  . Codeine Itching  . Fosphenytoin Itching  . Levaquin [Levofloxacin In D5w] Itching  . Levofloxacin Itching  . Lovastatin Itching  . Penicillins     Documentation indicates severe reaction  Pt tolerated cephalosporin without adverse reaction 09/18   . Pravastatin Itching  . Sulfa Antibiotics Itching    Medications:  I have reviewed the patient's current medications. Prior to Admission:  Medications Prior to Admission  Medication Sig Dispense Refill Last Dose  . albuterol (PROVENTIL) (2.5 MG/3ML)  0.083% nebulizer solution Take 3 mLs (2.5 mg total) by nebulization every 4 (four) hours as needed for wheezing or shortness of breath. 90 mL 2 unknown at unknown  . citalopram (CELEXA) 20 MG tablet Take 1 tablet (20 mg total) by mouth daily. 30 tablet 0 unknown at unknown  . clopidogrel (PLAVIX) 75 MG tablet Take 1 tablet (75 mg total) by mouth daily. 30 tablet 1 unknown at unknown  . diltiazem (DILACOR XR) 180 MG 24 hr capsule Take 180 mg by mouth daily.   unknown at unknown  . divalproex (DEPAKOTE) 250 MG DR tablet Take 3 tablets (750 mg total) by mouth every 12 (twelve) hours. 180 tablet 0 unknown at unknown  . furosemide (LASIX) 40 MG tablet  Take 1 tablet (40 mg total) by mouth daily. (Patient taking differently: Take 20 mg by mouth daily. ) 30 tablet 0 unknown at unknown  . GLIPIZIDE XL 5 MG 24 hr tablet Take 1 tablet (5 mg total) by mouth daily.   unknown at unknown  . lacosamide (VIMPAT) 200 MG TABS tablet Take 1 tablet (200 mg total) by mouth 2 (two) times daily. 60 tablet 0 unknown at unknown  . levETIRAcetam (KEPPRA) 250 MG tablet Take 1 tablet (250 mg total) 2 (two) times daily by mouth. 60 tablet 0 unknown at unknown  . magnesium oxide (MAG-OX) 400 (241.3 Mg) MG tablet Take 1 tablet (400 mg total) by mouth daily. 30 tablet 0 unknown at unknown  . mometasone-formoterol (DULERA) 200-5 MCG/ACT AERO Inhale 2 puffs into the lungs 2 (two) times daily. 1 Inhaler 1 unknown at unknown  . montelukast (SINGULAIR) 10 MG tablet Take 1 tablet by mouth at bedtime.  4 unknown at unknown  . omeprazole (PRILOSEC) 20 MG capsule Take 20 mg by mouth daily.   unknow at unknown  . rOPINIRole (REQUIP) 0.25 MG tablet Take 1 tablet (0.25 mg total) by mouth 3 (three) times daily. 90 tablet 2 unknown at unknown  . rosuvastatin (CRESTOR) 10 MG tablet Take 10 mg by mouth daily.  5 unknown at unknown  . albuterol (PROVENTIL HFA;VENTOLIN HFA) 108 (90 BASE) MCG/ACT inhaler Inhale 2 puffs into the lungs every 4 (four) hours as needed for wheezing or shortness of breath.   unknown at unknown   Scheduled: . divalproex  250 mg Oral Q12H  . docusate sodium  100 mg Oral BID  . enoxaparin (LOVENOX) injection  40 mg Subcutaneous Q24H  . levETIRAcetam  250 mg Oral BID  . OXcarbazepine  150 mg Oral BID  . potassium chloride  20 mEq Oral Once    ROS: History obtained from the patient  General ROS: negative for - chills, fatigue, fever, night sweats, weight gain or weight loss Psychological ROS: negative for - behavioral disorder, hallucinations, memory difficulties, mood swings or suicidal ideation Ophthalmic ROS: jumpy vision ENT ROS: deaf, dizziness on looking  down Allergy and Immunology ROS: negative for - hives or itchy/watery eyes Hematological and Lymphatic ROS: negative for - bleeding problems, bruising or swollen lymph nodes Endocrine ROS: negative for - galactorrhea, hair pattern changes, polydipsia/polyuria or temperature intolerance Respiratory ROS: negative for - cough, hemoptysis, shortness of breath or wheezing Cardiovascular ROS: negative for - chest pain, dyspnea on exertion, edema or irregular heartbeat Gastrointestinal ROS: negative for - abdominal pain, diarrhea, hematemesis, nausea/vomiting or stool incontinence Genito-Urinary ROS: negative for - dysuria, hematuria, incontinence or urinary frequency/urgency Musculoskeletal ROS: shoulder pain Neurological ROS: as noted in HPI Dermatological ROS: negative for rash and skin  lesion changes  Physical Examination: Blood pressure (!) 152/56, pulse (!) 53, temperature 98.1 F (36.7 C), temperature source Oral, resp. rate 20, height 5' 2" (1.575 m), weight 77.6 kg (171 lb), SpO2 97 %.  HEENT-  Normocephalic, no lesions, without obvious abnormality.  Normal external eye and conjunctiva.  Normal TM's bilaterally.  Normal auditory canals and external ears. Normal external nose, mucus membranes and septum.  Normal pharynx. Cardiovascular- S1, S2 normal, pulses palpable throughout   Lungs- chest clear, no wheezing, rales, normal symmetric air entry Abdomen- soft, non-tender; bowel sounds normal; no masses,  no organomegaly Extremities- no edema Lymph-no adenopathy palpable Musculoskeletal-no joint tenderness, deformity or swelling Skin-warm and dry, no hyperpigmentation, vitiligo, or suspicious lesions  Neurological Examination   Mental Status: Alert, oriented.  Signs and reads lips.  Able to follow 3 step commands without difficulty. Cranial Nerves: II: Discs flat bilaterally; Visual fields grossly normal, pupils equal, round, reactive to light and accommodation III,IV, VI: ptosis not  present, extra-ocular motions intact bilaterally, nystagmus V,VII: smile symmetric, facial light touch sensation normal bilaterally VIII: deaf IX,X: gag reflex present XI: bilateral shoulder shrug XII: midline tongue extension Motor: Right :  Upper extremity   5/5                                      Left:     Upper extremity   5/5             Lower extremity   5/5                                                  Lower extremity   5/5 Tone and bulk:normal tone throughout; no atrophy noted Sensory: Pinprick and light touch intact throughout, bilaterally Deep Tendon Reflexes: 2+ and symmetric throughout Plantars: Right: downgoing                                Left: downgoing Cerebellar: normal finger-to-nose testing Gait: not tested due to safety concerns    Laboratory Studies:   Basic Metabolic Panel: Recent Labs  Lab 06/15/17 1545  NA 140  K 2.4*  CL 98*  CO2 31  GLUCOSE 199*  BUN 13  CREATININE 0.53  CALCIUM 9.0    Liver Function Tests: Recent Labs  Lab 06/15/17 1545  AST 32  ALT 14  ALKPHOS 160*  BILITOT 0.6  PROT 6.9  ALBUMIN 3.2*   No results for input(s): LIPASE, AMYLASE in the last 168 hours. No results for input(s): AMMONIA in the last 168 hours.  CBC: Recent Labs  Lab 06/15/17 1545  WBC 5.7  NEUTROABS 2.8  HGB 14.9  HCT 42.1  MCV 83.1  PLT 191    Cardiac Enzymes: Recent Labs  Lab 06/15/17 1545  TROPONINI <0.03    BNP: Invalid input(s): POCBNP  CBG: No results for input(s): GLUCAP in the last 168 hours.  Microbiology: Results for orders placed or performed during the hospital encounter of 04/29/17  Blood culture (routine x 2)     Status: None   Collection Time: 04/29/17 12:38 PM  Result Value Ref Range Status   Specimen Description BLOOD RFOA  Final   Special Requests  Final    BOTTLES DRAWN AEROBIC AND ANAEROBIC Blood Culture adequate volume   Culture NO GROWTH 5 DAYS  Final   Report Status 05/04/2017 FINAL  Final  Blood  culture (routine x 2)     Status: None   Collection Time: 04/29/17 12:39 PM  Result Value Ref Range Status   Specimen Description BLOOD RIGHT HAND  Final   Special Requests   Final    BOTTLES DRAWN AEROBIC AND ANAEROBIC Blood Culture adequate volume   Culture NO GROWTH 5 DAYS  Final   Report Status 05/04/2017 FINAL  Final  Culture, respiratory (NON-Expectorated)     Status: None   Collection Time: 04/29/17  3:00 PM  Result Value Ref Range Status   Specimen Description TRACHEAL ASPIRATE  Final   Special Requests NONE  Final   Gram Stain   Final    MODERATE WBC PRESENT, PREDOMINANTLY PMN FEW GRAM POSITIVE COCCI FEW GRAM POSITIVE RODS    Culture   Final    Consistent with normal respiratory flora. Performed at [facility], [place] [address] [place], [place] [address]    Report Status 05/02/2017 FINAL  Final  MRSA PCR Screening     Status: None   Collection Time: 04/29/17  4:00 PM  Result Value Ref Range Status   MRSA by PCR NEGATIVE NEGATIVE Final    Comment:        The GeneXpert MRSA Assay (FDA approved for NASAL specimens only), is one component of a comprehensive MRSA colonization surveillance program. It is not intended to diagnose MRSA infection nor to guide or monitor treatment for MRSA infections.     Coagulation Studies: No results for input(s): LABPROT, INR in the last 72 hours.  Urinalysis:  Recent Labs  Lab 06/16/17 0202  COLORURINE YELLOW*  LABSPEC 1.016  PHURINE 6.0  GLUCOSEU NEGATIVE  HGBUR NEGATIVE  BILIRUBINUR NEGATIVE  KETONESUR NEGATIVE  PROTEINUR NEGATIVE  NITRITE NEGATIVE  LEUKOCYTESUR TRACE*    Lipid Panel:     Component Value Date/Time   TRIG 133 04/29/2017 1523    HgbA1C:  Lab Results  Component Value Date   HGBA1C 7.4 (H) 05/05/2017    Urine Drug Screen:      Component Value Date/Time   LABOPIA NONE DETECTED 04/29/2017 1312   COCAINSCRNUR NONE DETECTED 04/29/2017 1312   LABBENZ NONE DETECTED 04/29/2017 1312    AMPHETMU NONE DETECTED 04/29/2017 1312   THCU NONE DETECTED 04/29/2017 1312   LABBARB NONE DETECTED 04/29/2017 1312    Alcohol Level: No results for input(s): ETH in the last 168 hours.  Other results: EKG: sinus rhythm at 59 bpm.  Imaging: Ct Head Wo Contrast  Result Date: 06/15/2017 CLINICAL DATA:  Seizure.  Found down. EXAM: CT HEAD WITHOUT CONTRAST CT CERVICAL SPINE WITHOUT CONTRAST TECHNIQUE: Multidetector CT imaging of the head and cervical spine was performed following the standard protocol without intravenous contrast. Multiplanar CT image reconstructions of the cervical spine were also generated. COMPARISON:  05/22/2017 FINDINGS: CT HEAD FINDINGS Brain: There is no evidence for acute hemorrhage, hydrocephalus, mass lesion, or abnormal extra-axial fluid collection. No definite CT evidence for acute infarction. Patchy low attenuation in the deep hemispheric and periventricular white matter is nonspecific, but likely reflects chronic microvascular ischemic demyelination. Vascular: No hyperdense vessel or unexpected calcification. Skull: No evidence for fracture. No worrisome lytic or sclerotic lesion. Sinuses/Orbits: Status post left mastoid surgery. The visualized paranasal sinuses and mastoid air cells are clear. Visualized portions of the globes and intraorbital  fat are unremarkable. CT CERVICAL SPINE FINDINGS Alignment: Normal. Skull base and vertebrae: No acute fracture. No primary bone lesion or focal pathologic process. Soft tissues and spinal canal: No prevertebral fluid or swelling. No visible canal hematoma. Disc levels: Loss of disc height at C5-6 with endplate degeneration. Upper chest: 9 mm short axis high right paraesophageal lymph node is upper normal for size. Emphysema noted in the lung apices. Other: None. IMPRESSION: 1. No acute intracranial abnormality. 2. Chronic microvascular white matter ischemic disease. 3. Degenerative changes at C5-6 without cervical spine fracture.  Electronically Signed   By: Misty Stanley M.D.   On: 06/15/2017 16:40   Ct Cervical Spine Wo Contrast  Result Date: 06/15/2017 CLINICAL DATA:  Seizure.  Found down. EXAM: CT HEAD WITHOUT CONTRAST CT CERVICAL SPINE WITHOUT CONTRAST TECHNIQUE: Multidetector CT imaging of the head and cervical spine was performed following the standard protocol without intravenous contrast. Multiplanar CT image reconstructions of the cervical spine were also generated. COMPARISON:  05/22/2017 FINDINGS: CT HEAD FINDINGS Brain: There is no evidence for acute hemorrhage, hydrocephalus, mass lesion, or abnormal extra-axial fluid collection. No definite CT evidence for acute infarction. Patchy low attenuation in the deep hemispheric and periventricular white matter is nonspecific, but likely reflects chronic microvascular ischemic demyelination. Vascular: No hyperdense vessel or unexpected calcification. Skull: No evidence for fracture. No worrisome lytic or sclerotic lesion. Sinuses/Orbits: Status post left mastoid surgery. The visualized paranasal sinuses and mastoid air cells are clear. Visualized portions of the globes and intraorbital fat are unremarkable. CT CERVICAL SPINE FINDINGS Alignment: Normal. Skull base and vertebrae: No acute fracture. No primary bone lesion or focal pathologic process. Soft tissues and spinal canal: No prevertebral fluid or swelling. No visible canal hematoma. Disc levels: Loss of disc height at C5-6 with endplate degeneration. Upper chest: 9 mm short axis high right paraesophageal lymph node is upper normal for size. Emphysema noted in the lung apices. Other: None. IMPRESSION: 1. No acute intracranial abnormality. 2. Chronic microvascular white matter ischemic disease. 3. Degenerative changes at C5-6 without cervical spine fracture. Electronically Signed   By: Misty Stanley M.D.   On: 06/15/2017 16:40     Assessment/Plan: 66 year old female with a history of seizures presenting with breakthrough  seizures.  Also appears to be having some side effects to her anticonvulsant medications as well.  On last visit her Depakote was decreased which has helped her side effects to some degree.  It was hopeful that she could tolerate lower dose Keppra but that does not appear to be the case.   Head CT reviewed and shows no acute changes.  Recommendations: 1.  Start Trileptal 127m BID.  First dose today. 2.  Would continue Keppra through tomorrow then discontinue.  3.  Decrease Depakote to 2588mBID 4.  Continue Vimpat at current dose 5.  Depakote level 6.  Continue seizure precautions 7.  Ativan prn seizures  LeAlexis GoodellMD Neurology [phone]/05/2017, 11:04 AM

## 2017-06-16 NOTE — Progress Notes (Signed)
Clinical Education officer, museum (CSW) received consult for discharge planning. CSW is waiting on PT recommendation.   [employer], LCSW [phone]

## 2017-06-16 NOTE — Progress Notes (Signed)
[place] spent some time with the Daughter of the Pt in 1. Daughter is feeling stress as she is primary care giver and has little help at home. Daughter is looking into various avenues to give her a break from the constant care. She appreciated the chance to vent her frustrations. CH is available for follow up as needed.    06/16/17 1500  Clinical Encounter Type  Visited With Family  Visit Type Initial;Spiritual support  Referral From Family  Consult/Referral To Chaplain

## 2017-06-16 NOTE — Plan of Care (Signed)
  Progressing Health Behavior/Discharge Planning: Ability to manage health-related needs will improve 06/16/2017 0302 - Progressing by Annitta Needs, RN Clinical Measurements: Ability to maintain clinical measurements within normal limits will improve 06/16/2017 0302 - Progressing by Annitta Needs, RN Will remain free from infection 06/16/2017 0302 - Progressing by Annitta Needs, RN Respiratory complications will improve 06/16/2017 0302 - Progressing by Annitta Needs, RN Activity: Risk for activity intolerance will decrease 06/16/2017 0302 - Progressing by Annitta Needs, RN Nutrition: Adequate nutrition will be maintained 06/16/2017 0302 - Progressing by Annitta Needs, RN Coping: Level of anxiety will decrease 06/16/2017 0302 - Progressing by Annitta Needs, RN Elimination: Will not experience complications related to bowel motility 06/16/2017 0302 - Progressing by Annitta Needs, RN Pain Managment: General experience of comfort will improve 06/16/2017 0302 - Progressing by Annitta Needs, RN Safety: Ability to remain free from injury will improve 06/16/2017 0302 - Progressing by Annitta Needs, RN Skin Integrity: Risk for impaired skin integrity will decrease 06/16/2017 0302 - Progressing by Annitta Needs, RN Coping: Ability to adjust to condition or change in health will improve 06/16/2017 0302 - Progressing by Annitta Needs, RN Ability to identify appropriate support needs will improve 06/16/2017 0302 - Progressing by Annitta Needs, [place] Behavior/Discharge Planning: Compliance with prescribed medication regimen will improve 06/16/2017 0302 - Progressing by Annitta Needs, RN Medication: Risk for medication side effects will decrease 06/16/2017 0302 - Progressing by Annitta Needs, RN Clinical Measurements: Complications related to the disease process, condition or treatment will be avoided or  minimized 06/16/2017 0302 - Progressing by Annitta Needs, RN Diagnostic test results will improve 06/16/2017 0302 - Progressing by Annitta Needs, RN Safety: Verbalization of understanding the information provided will improve 06/16/2017 0302 - Progressing by Annitta Needs, RN Self-Concept: Level of anxiety will decrease 06/16/2017 0302 - Progressing by Annitta Needs, RN Ability to verbalize feelings about condition will improve 06/16/2017 0302 - Progressing by Annitta Needs, RN

## 2017-06-17 LAB — MAGNESIUM: Magnesium: 2 mg/dL (ref 1.7–2.4)

## 2017-06-17 LAB — BASIC METABOLIC PANEL
ANION GAP: 6 (ref 5–15)
BUN: 11 mg/dL (ref 6–20)
CALCIUM: 8.7 mg/dL — AB (ref 8.9–10.3)
CO2: 29 mmol/L (ref 22–32)
CREATININE: 0.53 mg/dL (ref 0.44–1.00)
Chloride: 107 mmol/L (ref 101–111)
GFR calc Af Amer: >60 mL/min (ref >60)
GLUCOSE: 114 mg/dL — AB (ref 65–99)
POTASSIUM: 3.3 mmol/L — AB (ref 3.5–5.1)
SODIUM: 142 mmol/L (ref 135–145)

## 2017-06-17 LAB — CLOSTRIDIUM DIFFICILE BY PCR: Toxigenic C. Difficile by PCR: NEGATIVE

## 2017-06-17 LAB — GLUCOSE, CAPILLARY
Glucose-Capillary: 121 mg/dL — ABNORMAL HIGH (ref 65–99)
Glucose-Capillary: 192 mg/dL — ABNORMAL HIGH (ref 65–99)

## 2017-06-17 MED ORDER — POTASSIUM CHLORIDE CRYS ER 20 MEQ PO TBCR
40.0000 meq | EXTENDED_RELEASE_TABLET | Freq: Once | ORAL | Status: AC
  Administered 2017-06-17: 13:00:00 40 meq via ORAL
  Filled 2017-06-17: qty 2

## 2017-06-17 MED ORDER — OXCARBAZEPINE 150 MG PO TABS: 150.0000 mg | ORAL_TABLET | Freq: Two times a day (BID) | ORAL | 0 refills | Status: DC

## 2017-06-17 MED ORDER — DIVALPROEX SODIUM 250 MG PO DR TAB: 250.0000 mg | DELAYED_RELEASE_TABLET | Freq: Two times a day (BID) | ORAL | 0 refills | Status: DC

## 2017-06-17 MED ORDER — ENSURE ENLIVE PO LIQD
237.0000 mL | Freq: Two times a day (BID) | ORAL | 12 refills | Status: DC
Start: 2017-06-17 — End: 2017-09-23

## 2017-06-17 NOTE — Progress Notes (Signed)
Pt for discharge home alert no resp distress. Sl and iv d/cd.  Instructions to pt. presc given they and home meds discussed. Diet / activity and f/u discussed.  Verbalizes understanding. Home via w/c at this time with dtr. No c/o.

## 2017-06-17 NOTE — Evaluation (Signed)
Occupational Therapy Evaluation Patient Details Name: [name] MRN: [id number] DOB: [birth year] Today's Date: 06/17/2017    History of Present Illness Pt is a 66 y/o F who was found unresponsive in her bathroom at home.  Pt with witnessed seizures by EMS and pt received antiseizure medication in ED.  Pt admitted for metabolic encephalopathy.  CT head and CT cervical spine negative.  Pt's PMH includes cirrhosis, COPD, deaf, neuropathy, seizures, TIA, TKA.    Clinical Impression   Pt seen for OT evaluation this date. Pt presents grossly at baseline for ADL tasks, however increased effort/time to perform due to impaired activity tolerance. Pt/dtr educated in energy conservation strategies including activity pacing, work simplification, pursed lip breathing, falls prevention, AE/DME, home/routines modifications to maximize functional independence and safety. Handout provided. Pt/dtr verbalized understanding. Pt will benefit from skilled OT services to address activity tolerance, falls prevention, and assist in training and implementation of energy conservation strategies and home safety/set up in order to maximize pt's functional independence and minimize risk of future falls, injury, and rehospitalization. Recommend HHOT services following this hospitalization.    Follow Up Recommendations  Home health OT;Supervision/Assistance - 24 hour    Equipment Recommendations  None recommended by OT    Recommendations for Other Services       Precautions / Restrictions Precautions Precautions: Fall;Other (comment) Precaution Comments: O2 Restrictions Weight Bearing Restrictions: No      Mobility Bed Mobility Overal bed mobility: Independent                Transfers Overall transfer level: Needs assistance Equipment used: Rolling walker (2 wheeled) Transfers: Sit to/from Stand Sit to Stand: Supervision              Balance Overall balance assessment: Needs  assistance;History of Falls Sitting-balance support: No upper extremity supported;Feet supported Sitting balance-Leahy Scale: Good     Standing balance support: No upper extremity supported;During functional activity Standing balance-Leahy Scale: Fair                             ADL either performed or assessed with clinical judgement   ADL Overall ADL's : At baseline;Modified independent                                       General ADL Comments: Pt is grossly at baseline for ADL tasks, however increased effort/time to perform. Pt/dtr educated in energy conservation strategies including activity pacing, work simplification, pursed lip breathing, falls prevention, AE/DME, home/routines modifications to maximize functional independence and safety. Handout provided. Pt/dtr verbalized understanding.      Vision Baseline Vision/History: No visual deficits Patient Visual Report: No change from baseline Vision Assessment?: No apparent visual deficits     Perception     Praxis      Pertinent Vitals/Pain Pain Assessment: No/denies pain     Hand Dominance     Extremity/Trunk Assessment Upper Extremity Assessment Upper Extremity Assessment: Overall WFL for tasks assessed(BUE grossly 4+/5)   Lower Extremity Assessment Lower Extremity Assessment: Defer to PT evaluation   Cervical / Trunk Assessment Cervical / Trunk Assessment: Normal   Communication Communication Communication: Deaf(pt is an excellent lip reader)   Cognition Arousal/Alertness: Awake/alert Behavior During Therapy: WFL for tasks assessed/performed Overall Cognitive Status: Within Functional Limits for tasks assessed  General Comments       Exercises     Shoulder Instructions      Home Living Family/patient expects to be discharged to:: Private residence Living Arrangements: Alone Available Help at Discharge: Family;Available  24 hours/day(daughter to stay with pt for a few days) Type of Home: Apartment Home Access: Level entry     Home Layout: One level     Bathroom Shower/Tub: Tub/shower unit;Curtain   Biochemist, clinical: Standard     Home Equipment: Grab bars - tub/shower;Grab bars - toilet;Walker - 4 wheels;Wheelchair - manual;Cane - single point;Shower seat          Prior Functioning/Environment Level of Independence: Needs assistance  Gait / Transfers Assistance Needed: Pt ambulating with rollator at all times.  Has had many falls since last admission which her MD says is due to her medication.  Plan is to have these adjusted to prevent dizziness and falls.   ADL's / Homemaking Assistance Needed: Pt requires assist with cooking, cleaning.  Independent with bathing, dressing.  Communication / Swallowing Assistance Needed: Pt deaf          OT Problem List: Decreased strength;Decreased knowledge of use of DME or AE;Cardiopulmonary status limiting activity;Decreased activity tolerance      OT Treatment/Interventions:      OT Goals(Current goals can be found in the care plan section) Acute Rehab OT Goals Patient Stated Goal: to decrease falling episodes  OT Goal Formulation: With patient/family  OT Frequency:     Barriers to D/C:            Co-evaluation              AM-PAC PT "6 Clicks" Daily Activity     Outcome Measure Help from another person eating meals?: None Help from another person taking care of personal grooming?: None Help from another person toileting, which includes using toliet, bedpan, or urinal?: None Help from another person bathing (including washing, rinsing, drying)?: A Little Help from another person to put on and taking off regular upper body clothing?: None Help from another person to put on and taking off regular lower body clothing?: A Little 6 Click Score: 22   End of Session    Activity Tolerance: Patient tolerated treatment well Patient left: in  bed;with call bell/phone within reach;with bed alarm set;with family/visitor present  OT Visit Diagnosis: Other abnormalities of gait and mobility (R26.89);Repeated falls (R29.6);Muscle weakness (generalized) (M62.81)                Time: 8270-7867 OT Time Calculation (min): 17 min Charges:  OT General Charges $OT Visit: 1 Visit OT Evaluation $OT Eval Low Complexity: 1 Low OT Treatments $Self Care/Home Management : 8-22 mins G-Codes: OT G-codes **NOT FOR INPATIENT CLASS** Functional Assessment Tool Used: AM-PAC 6 Clicks Daily Activity;Clinical judgement Functional Limitation: Self care Self Care Current Status (J4492): At least 1 percent but less than 20 percent impaired, limited or restricted Self Care Goal Status (E1007): At least 1 percent but less than 20 percent impaired, limited or restricted   Jeni Salles, MPH, MS, OTR/L ascom [phone] 06/17/17, 11:15 AM

## 2017-06-17 NOTE — Care Management Note (Signed)
Case Management Note  Patient Details  Name: [name] MRN: [id number] Date of Birth: 1951-02-23  Subjective/Objective:  Admitted to [facility] with the diagnosis of seizures. Lives alone. Son is [name] [phone]) and Kenney Houseman [phone]). Last seen at [facility] 06/05/17. Chronic oxygen since 1990 per Lupus. Uses 2 liters per nasal cannula at night prn. Wheelchair, cane, rolling walker, and shower chair in the home.     Followed by [facility] in the home at present. [facility] 05/05/17. [facility] prior to this admission good appetite. Daughter will transport.  Discharge to home today per Dr, Anselm Jungling              Action/Plan: Will update [place] concerning services   Expected Discharge Date:  06/17/17               Expected Discharge Plan:     In-House Referral:   yes  Discharge planning Services     Post Acute Care Choice:    Choice offered to:     DME Arranged:    DME Agency:     HH Arranged:   yes HH Agency:   [facility]  Status of Service:     If discussed at [place] of Stay Meetings, dates discussed:    Additional Comments:  Shelbie Ammons, RN MSN CCM Care Management [phone] 06/17/2017, 12:35 PM

## 2017-06-17 NOTE — Plan of Care (Signed)
  Progressing Health Behavior/Discharge Planning: Ability to manage health-related needs will improve 06/17/2017 1000 - Progressing by Rowe Robert, RN Clinical Measurements: Ability to maintain clinical measurements within normal limits will improve 06/17/2017 1000 - Progressing by Rowe Robert, RN Will remain free from infection 06/17/2017 1000 - Progressing by Rowe Robert, RN Respiratory complications will improve 06/17/2017 1000 - Progressing by Rowe Robert, RN Activity: Risk for activity intolerance will decrease 06/17/2017 1000 - Progressing by Rowe Robert, RN Nutrition: Adequate nutrition will be maintained 06/17/2017 1000 - Progressing by Rowe Robert, RN Coping: Level of anxiety will decrease 06/17/2017 1000 - Progressing by Rowe Robert, RN Elimination: Will not experience complications related to bowel motility 06/17/2017 1000 - Progressing by Rowe Robert, RN Pain Managment: General experience of comfort will improve 06/17/2017 1000 - Progressing by Rowe Robert, RN Safety: Ability to remain free from injury will improve 06/17/2017 1000 - Progressing by Rowe Robert, RN Skin Integrity: Risk for impaired skin integrity will decrease 06/17/2017 1000 - Progressing by Rowe Robert, RN Coping: Ability to adjust to condition or change in health will improve 06/17/2017 1000 - Progressing by Rowe Robert, RN Ability to identify appropriate support needs will improve 06/17/2017 1000 - Progressing by Rowe Robert, RN Health Behavior/Discharge Planning: Compliance with prescribed medication regimen will improve 06/17/2017 1000 - Progressing by Rowe Robert, RN Medication: Risk for medication side effects will decrease 06/17/2017 1000 - Progressing by Rowe Robert, RN Clinical Measurements: Complications related to the disease process, condition or treatment will be avoided or minimized 06/17/2017 1000 - Progressing by Rowe Robert,  RN Diagnostic test results will improve 06/17/2017 1000 - Progressing by Rowe Robert, RN Safety: Verbalization of understanding the information provided will improve 06/17/2017 1000 - Progressing by Rowe Robert, RN Self-Concept: Level of anxiety will decrease 06/17/2017 1000 - Progressing by Rowe Robert, RN Ability to verbalize feelings about condition will improve 06/17/2017 1000 - Progressing by Rowe Robert, RN

## 2017-06-17 NOTE — Progress Notes (Signed)
Subjective: Patient reports improvement today.  Ambulating better.  Vision improved.  Has had no further seizures since admission.    Objective: Current vital signs: BP (!) 153/92   Pulse (!) 52   Temp 98.4 F (36.9 C) (Oral)   Resp 20   Ht _0  (1.575 m)   Wt 77.6 kg (171 lb)   SpO2 92%   BMI 31.28 kg/m  Vital signs in last 24 hours: Temp:  [98 F (36.7 C)-98.4 F (36.9 C)] 98.4 F (36.9 C) (12/12 0849) Pulse Rate:  [48-59] 52 (12/12 0849) Resp:  [16-20] 20 (12/12 0849) BP: (135-177)/(51-92) 153/92 (12/12 0849) SpO2:  [92 %-96 %] 92 % (12/12 0849)  Intake/Output from previous day: 12/11 0701 - 12/12 0700 In: 1952.5 [P.O.:120; I.V.:1832.5] Out: -  Intake/Output this shift: No intake/output data recorded. Nutritional status: Diet 2 gram sodium Room service appropriate? Yes; Fluid consistency: Thin  Neurologic Exam: Alert, oriented.  Signs and reads lips. Able to follow 3 step commands without difficulty. Cranial Nerves: II: Discs flat bilaterally; Visual fields grossly normal, pupils equal, round, reactive to light and accommodation III,IV, VI: ptosis not present, extra-ocular motions intact bilaterally, improved nystagmus V,VII: smile symmetric, facial light touch sensation normal bilaterally VIII:deaf IX,X: gag reflex present XI: bilateral shoulder shrug XII: midline tongue extension Motor: Right :Upper extremity 5/5Left: Upper extremity 5/5 Lower extremity 5/5Lower extremity 5/5  Lab Results: Basic Metabolic Panel: Recent Labs  Lab 06/15/17 1545 06/16/17 1303 06/17/17 0507  NA 140  --  142  K 2.4* 3.3* 3.3*  CL 98*  --  107  CO2 31  --  29  GLUCOSE 199*  --  114*  BUN 13  --  11  CREATININE 0.53  --  0.53  CALCIUM 9.0  --  8.7*  MG  --  1.5* 2.0    Liver Function Tests: Recent Labs  Lab 06/15/17 1545  AST 32  ALT 14  ALKPHOS 160*   BILITOT 0.6  PROT 6.9  ALBUMIN 3.2*   No results for input(s): LIPASE, AMYLASE in the last 168 hours. No results for input(s): AMMONIA in the last 168 hours.  CBC: Recent Labs  Lab 06/15/17 1545  WBC 5.7  NEUTROABS 2.8  HGB 14.9  HCT 42.1  MCV 83.1  PLT 191    Cardiac Enzymes: Recent Labs  Lab 06/15/17 1545  TROPONINI <0.03    Lipid Panel: No results for input(s): CHOL, TRIG, HDL, CHOLHDL, VLDL, LDLCALC in the last 168 hours.  CBG: Recent Labs  Lab 06/16/17 1249 06/16/17 1736 06/16/17 2200 06/17/17 0743  GLUCAP 155* 150* 146* 121*    Microbiology: Results for orders placed or performed during the hospital encounter of 06/15/17  MRSA PCR Screening     Status: Abnormal   Collection Time: 06/16/17 11:22 AM  Result Value Ref Range Status   MRSA by PCR POSITIVE (A) NEGATIVE Final    Comment:        The GeneXpert MRSA Assay (FDA approved for NASAL specimens only), is one component of a comprehensive MRSA colonization surveillance program. It is not intended to diagnose MRSA infection nor to guide or monitor treatment for MRSA infections. RESULT CALLED TO, READ BACK BY AND VERIFIED WITH:  WAYNETTE MILES 1252 06/16/17 [facility]   C difficile quick scan w PCR reflex     Status: Abnormal   Collection Time: 06/16/17 11:23 AM  Result Value Ref Range Status   C Diff antigen POSITIVE (A) NEGATIVE Final   C Diff  toxin NEGATIVE NEGATIVE Final   C Diff interpretation Results are indeterminate. See PCR results.  Final  Gastrointestinal Panel by PCR , Stool     Status: None   Collection Time: 06/16/17 11:23 AM  Result Value Ref Range Status   Campylobacter species NOT DETECTED NOT DETECTED Final   Plesimonas shigelloides NOT DETECTED NOT DETECTED Final   Salmonella species NOT DETECTED NOT DETECTED Final   Yersinia enterocolitica NOT DETECTED NOT DETECTED Final   Vibrio species NOT DETECTED NOT DETECTED Final   Vibrio cholerae NOT DETECTED NOT DETECTED Final    Enteroaggregative E coli (EAEC) NOT DETECTED NOT DETECTED Final   Enteropathogenic E coli (EPEC) NOT DETECTED NOT DETECTED Final   Enterotoxigenic E coli (ETEC) NOT DETECTED NOT DETECTED Final   Shiga like toxin producing E coli (STEC) NOT DETECTED NOT DETECTED Final   Shigella/Enteroinvasive E coli (EIEC) NOT DETECTED NOT DETECTED Final   Cryptosporidium NOT DETECTED NOT DETECTED Final   Cyclospora cayetanensis NOT DETECTED NOT DETECTED Final   Entamoeba histolytica NOT DETECTED NOT DETECTED Final   Giardia lamblia NOT DETECTED NOT DETECTED Final   Adenovirus F40/41 NOT DETECTED NOT DETECTED Final   Astrovirus NOT DETECTED NOT DETECTED Final   Norovirus GI/GII NOT DETECTED NOT DETECTED Final   Rotavirus A NOT DETECTED NOT DETECTED Final   Sapovirus (I, II, IV, and V) NOT DETECTED NOT DETECTED Final    Coagulation Studies: No results for input(s): LABPROT, INR in the last 72 hours.  Imaging: Dg Chest 2 View  Result Date: 06/17/2017 CLINICAL DATA:  Acute onset of rib pain. EXAM: CHEST  2 VIEW COMPARISON:  Chest radiograph performed 05/22/2017 FINDINGS: The lungs are well-aerated. Mild vascular congestion is noted. Minimal bibasilar atelectasis is seen. There is no evidence of pleural effusion or pneumothorax. The heart is borderline normal in size. No acute osseous abnormalities are seen. IMPRESSION: Mild vascular congestion.  Minimal bibasilar atelectasis noted. Electronically Signed   By: Garald Balding M.D.   On: 06/17/2017 03:36   Ct Head Wo Contrast  Result Date: 06/15/2017 CLINICAL DATA:  Seizure.  Found down. EXAM: CT HEAD WITHOUT CONTRAST CT CERVICAL SPINE WITHOUT CONTRAST TECHNIQUE: Multidetector CT imaging of the head and cervical spine was performed following the standard protocol without intravenous contrast. Multiplanar CT image reconstructions of the cervical spine were also generated. COMPARISON:  05/22/2017 FINDINGS: CT HEAD FINDINGS Brain: There is no evidence for acute  hemorrhage, hydrocephalus, mass lesion, or abnormal extra-axial fluid collection. No definite CT evidence for acute infarction. Patchy low attenuation in the deep hemispheric and periventricular white matter is nonspecific, but likely reflects chronic microvascular ischemic demyelination. Vascular: No hyperdense vessel or unexpected calcification. Skull: No evidence for fracture. No worrisome lytic or sclerotic lesion. Sinuses/Orbits: Status post left mastoid surgery. The visualized paranasal sinuses and mastoid air cells are clear. Visualized portions of the globes and intraorbital fat are unremarkable. CT CERVICAL SPINE FINDINGS Alignment: Normal. Skull base and vertebrae: No acute fracture. No primary bone lesion or focal pathologic process. Soft tissues and spinal canal: No prevertebral fluid or swelling. No visible canal hematoma. Disc levels: Loss of disc height at C5-6 with endplate degeneration. Upper chest: 9 mm short axis high right paraesophageal lymph node is upper normal for size. Emphysema noted in the lung apices. Other: None. IMPRESSION: 1. No acute intracranial abnormality. 2. Chronic microvascular white matter ischemic disease. 3. Degenerative changes at C5-6 without cervical spine fracture. Electronically Signed   By: Misty Stanley M.D.   On:  06/15/2017 16:40   Ct Cervical Spine Wo Contrast  Result Date: 06/15/2017 CLINICAL DATA:  Seizure.  Found down. EXAM: CT HEAD WITHOUT CONTRAST CT CERVICAL SPINE WITHOUT CONTRAST TECHNIQUE: Multidetector CT imaging of the head and cervical spine was performed following the standard protocol without intravenous contrast. Multiplanar CT image reconstructions of the cervical spine were also generated. COMPARISON:  05/22/2017 FINDINGS: CT HEAD FINDINGS Brain: There is no evidence for acute hemorrhage, hydrocephalus, mass lesion, or abnormal extra-axial fluid collection. No definite CT evidence for acute infarction. Patchy low attenuation in the deep  hemispheric and periventricular white matter is nonspecific, but likely reflects chronic microvascular ischemic demyelination. Vascular: No hyperdense vessel or unexpected calcification. Skull: No evidence for fracture. No worrisome lytic or sclerotic lesion. Sinuses/Orbits: Status post left mastoid surgery. The visualized paranasal sinuses and mastoid air cells are clear. Visualized portions of the globes and intraorbital fat are unremarkable. CT CERVICAL SPINE FINDINGS Alignment: Normal. Skull base and vertebrae: No acute fracture. No primary bone lesion or focal pathologic process. Soft tissues and spinal canal: No prevertebral fluid or swelling. No visible canal hematoma. Disc levels: Loss of disc height at C5-6 with endplate degeneration. Upper chest: 9 mm short axis high right paraesophageal lymph node is upper normal for size. Emphysema noted in the lung apices. Other: None. IMPRESSION: 1. No acute intracranial abnormality. 2. Chronic microvascular white matter ischemic disease. 3. Degenerative changes at C5-6 without cervical spine fracture. Electronically Signed   By: Misty Stanley M.D.   On: 06/15/2017 16:40    Medications:  I have reviewed the patient's current medications. Scheduled: . Chlorhexidine Gluconate Cloth  6 each Topical Q0600  . citalopram  20 mg Oral Daily  . clopidogrel  75 mg Oral Daily  . diltiazem  180 mg Oral Daily  . divalproex  250 mg Oral Q12H  . docusate sodium  100 mg Oral BID  . enoxaparin (LOVENOX) injection  40 mg Subcutaneous Q24H  . feeding supplement (ENSURE ENLIVE)  237 mL Oral BID BM  . insulin aspart  0-5 Units Subcutaneous QHS  . insulin aspart  0-9 Units Subcutaneous TID WC  . levETIRAcetam  250 mg Oral BID  . magnesium oxide  400 mg Oral Daily  . mometasone-formoterol  2 puff Inhalation BID  . montelukast  10 mg Oral QHS  . mupirocin ointment  1 application Nasal BID  . OXcarbazepine  150 mg Oral BID  . rOPINIRole  0.25 mg Oral TID     Assessment/Plan: Patient improved.  No further seizures noted.    Recommendations: 1.  Patient to continue Vimpat 296m BID, Dpeakote 2539mBID and Trileptal 15065mID. 2.  Patient to keep scheduled outpatient appointment with neurology    LOS: 2 days   LesAlexis GoodellD Neurology [phone]/06/2017  10:46 AM

## 2017-06-17 NOTE — Care Management Important Message (Signed)
Important Message  Patient Details  Name: [name] MRN: [id number] Date of Birth: Aug 07, 1950   Medicare Important Message Given:       Shelbie Ammons, RN 06/17/2017, 8:17 AM

## 2017-06-17 NOTE — Progress Notes (Signed)
Physical Therapy Treatment Patient Details Name: [name] MRN: [id number] DOB: 1950-11-09 Today's Date: 06/17/2017    History of Present Illness Pt is a 66 y/o F who was found unresponsive in her bathroom at home.  Pt with witnessed seizures by EMS and pt received antiseizure medication in ED.  Pt admitted for metabolic encephalopathy.  CT head and CT cervical spine negative.  Pt's PMH includes cirrhosis, COPD, deaf, neuropathy, seizures, TIA, TKA.     PT Comments    Ms. Siegmann was very pleasant and agreeable to work with therapy.   SpO2 ranging from 87-93% on RA while ambulating.  Back up to mid 90s at rest.  RN notified.  Intructed pt in pursed lip breathing throughout session and to continue practicing once d/c. Follow up recommendations remain appropriate.    Follow Up Recommendations  Home health PT;Supervision/Assistance - 24 hour     Equipment Recommendations  None recommended by PT    Recommendations for Other Services OT consult     Precautions / Restrictions Precautions Precautions: Fall;Other (comment) Precaution Comments: O2 Restrictions Weight Bearing Restrictions: No    Mobility  Bed Mobility Overal bed mobility: Independent             General bed mobility comments: No physical assist or cues needed.  Pt performs independently.   Transfers Overall transfer level: Modified independent Equipment used: Rolling walker (2 wheeled) Transfers: Sit to/from Stand Sit to Stand: Modified independent (Device/Increase time)         General transfer comment: Pt steady with no signs of instability.    Ambulation/Gait Ambulation/Gait assistance: Supervision Ambulation Distance (Feet): 40 Feet Assistive device: Rolling walker (2 wheeled) Gait Pattern/deviations: Step-through pattern   Gait velocity interpretation: at or above normal speed for age/gender General Gait Details: Limited to room as pt with enteric precautions.  Pt with steady gait and no  signs of instability using RW.  SpO2 ranging from 87-93% on RA.  Back up to mid 90s at rest.  RN notified.  Intructed pt in pursed lip breathing throughout session and to continue practicing once d/c.    Stairs            Wheelchair Mobility    Modified Rankin (Stroke Patients Only)       Balance Overall balance assessment: Needs assistance;History of Falls Sitting-balance support: No upper extremity supported;Feet supported Sitting balance-Leahy Scale: Good     Standing balance support: No upper extremity supported;During functional activity Standing balance-Leahy Scale: Fair Standing balance comment: Pt able to stand statically but benefits from UE support with dynamic activities                            Cognition Arousal/Alertness: Awake/alert Behavior During Therapy: WFL for tasks assessed/performed Overall Cognitive Status: Within Functional Limits for tasks assessed                                        Exercises Other Exercises Other Exercises: Mini squats with BUEs supported on countertop x10 Other Exercises: Marching in place with BUEs supported on countertop x10 each LE Other Exercises: Dynamic alternating tandem stance x10 each LE Other Exercises: Rhomber stance with eyes closed x30 seconds with intermittent min assist required Other Exercises: Eyes closed normal stance x30 seconds with increased potural sway    General Comments General comments (skin integrity, edema,  etc.): Daughter present during session      Pertinent Vitals/Pain Pain Assessment: Faces Faces Pain Scale: Hurts a little bit Pain Location: Chronic L lower back pain Pain Descriptors / Indicators: Aching Pain Intervention(s): Limited activity within patient's tolerance;Monitored during session    [place] expects to be discharged to:: Private residence Living Arrangements: Alone Available Help at Discharge: Family;Available 24  hours/day(daughter to stay with pt for a few days) Type of Home: Apartment Home Access: Level entry   Home Layout: One level Home Equipment: Grab bars - tub/shower;Grab bars - toilet;Walker - 4 wheels;Wheelchair - manual;Cane - single point;Shower seat      Prior Function Level of Independence: Needs assistance  Gait / Transfers Assistance Needed: Pt ambulating with rollator at all times.  Has had many falls since last admission which her MD says is due to her medication.  Plan is to have these adjusted to prevent dizziness and falls.   ADL's / Homemaking Assistance Needed: Pt requires assist with cooking, cleaning.  Independent with bathing, dressing.      PT Goals (current goals can now be found in the care plan section) Acute Rehab PT Goals Patient Stated Goal: to go home today PT Goal Formulation: With patient Time For Goal Achievement: 06/30/17 Potential to Achieve Goals: Good Progress towards PT goals: Progressing toward goals    Frequency    Min 2X/week      PT Plan Current plan remains appropriate    Co-evaluation              AM-PAC PT "6 Clicks" Daily Activity  Outcome Measure  Difficulty turning over in bed (including adjusting bedclothes, sheets and blankets)?: None Difficulty moving from lying on back to sitting on the side of the bed? : None Difficulty sitting down on and standing up from a chair with arms (e.g., wheelchair, bedside commode, etc,.)?: A Little Help needed moving to and from a bed to chair (including a wheelchair)?: A Little Help needed walking in hospital room?: A Little Help needed climbing 3-5 steps with a railing? : A Little 6 Click Score: 20    End of Session Equipment Utilized During Treatment: Gait belt Activity Tolerance: Patient tolerated treatment well Patient left: with call bell/phone within reach;in bed;Other (comment);with family/visitor present(sitting EOB) Nurse Communication: Mobility status;Other (comment)(SpO2; pt  bleeding from IV site) PT Visit Diagnosis: Unsteadiness on feet (R26.81);Muscle weakness (generalized) (M62.81);Other abnormalities of gait and mobility (R26.89);History of falling (Z91.81)     Time: 0488-8916 PT Time Calculation (min) (ACUTE ONLY): 23 min  Charges:  $Gait Training: 8-22 mins $Therapeutic Exercise: 8-22 mins                    G Codes:       Collie Siad PT, DPT 06/17/2017, 1:30 PM

## 2017-06-19 DIAGNOSIS — J449 Chronic obstructive pulmonary disease, unspecified: Secondary | ICD-10-CM | POA: Diagnosis not present

## 2017-06-19 DIAGNOSIS — M069 Rheumatoid arthritis, unspecified: Secondary | ICD-10-CM | POA: Diagnosis not present

## 2017-06-19 DIAGNOSIS — I10 Essential (primary) hypertension: Secondary | ICD-10-CM | POA: Diagnosis not present

## 2017-06-19 DIAGNOSIS — M150 Primary generalized (osteo)arthritis: Secondary | ICD-10-CM | POA: Diagnosis not present

## 2017-06-19 DIAGNOSIS — G40909 Epilepsy, unspecified, not intractable, without status epilepticus: Secondary | ICD-10-CM | POA: Diagnosis not present

## 2017-06-19 DIAGNOSIS — E119 Type 2 diabetes mellitus without complications: Secondary | ICD-10-CM | POA: Diagnosis not present

## 2017-06-20 NOTE — Discharge Summary (Signed)
[place] [facility] [place] NAME: [name]    MR#:  [id number]  DATE OF BIRTH:  07/20/1950  DATE OF ADMISSION:  06/15/2017 ADMITTING PHYSICIAN: Fritzi Mandes, MD  DATE OF DISCHARGE: 06/17/2017  1:22 PM  PRIMARY CARE PHYSICIAN: [facility] [place]    ADMISSION DIAGNOSIS:  Hypokalemia [E87.6] Seizure ([place]) [R56.9] Post-ictal state ([place]) [R56.9]  DISCHARGE DIAGNOSIS:  Active Problems:   Seizures ([place])   SECONDARY DIAGNOSIS:   Past Medical History:  Diagnosis Date  . Asthma   . Cirrhosis, non-alcoholic ([place])   . COPD (chronic obstructive pulmonary disease) ([place])   . Deaf   . Depression   . Diabetes mellitus without complication ([place])   . GERD (gastroesophageal reflux disease)   . Heart murmur   . Hepatitis   . Hypertension   . Lymph node disorder    arm  . Neuropathy   . On home oxygen therapy    hs  . Orthopnea   . RLS (restless legs syndrome)   . Seizures ([place])   . Shortness of breath dyspnea   . Sleep apnea   . Stroke [facility])    [place] COURSE:   [name] a66 y.o.femalewith a known history of seizures, nonalcoholic cirrhosis, diabetes comes to the emergency room brought by EMS after neighbors found patient unresponsive in her bathroom at home. Patient currently sedated after she received antiseizure meds in the emergency room.  1. Acute metabolic encephalopathy with suspected seizures at home -Was found in the bathroom by neighbors brought to the emergency room she received a loading dose of IV Keppra thousand milligrams and 3 mg of IV Ativan. -We will continue seizure precaution  Appreciated neurology help. Start Trileptal 140m BID.  Diacontinue Keppra .   Decrease Depakote to 2593mBID  Continue Vimpat at current dose  Depakote level  Ativan prn seizures  -CT head and CT cervical spine negative -Continue IV fluids  2. Hypokalemia   replete with IV  potassium   Check magnesium.  3. Diabetes sliding scale for now  4. DVT prophylaxis subcu Lovenox  Pt was completely alert and no complains next day.   DISCHARGE CONDITIONS:   Stable.  CONSULTS OBTAINED:  Treatment Team:  ReAlexis GoodellMD  DRUG ALLERGIES:   Allergies  Allergen Reactions  . Aspirin Itching  . Celebrex [Celecoxib] Itching    itching  . Ciprofloxacin Itching  . Codeine Itching  . Fosphenytoin Itching  . Levaquin [Levofloxacin In D5w] Itching  . Levofloxacin Itching  . Lovastatin Itching  . Penicillins     Documentation indicates severe reaction  Pt tolerated cephalosporin without adverse reaction 09/18   . Pravastatin Itching  . Sulfa Antibiotics Itching    DISCHARGE MEDICATIONS:   Allergies as of 06/17/2017      Reactions   Aspirin Itching   Celebrex [celecoxib] Itching   itching   Ciprofloxacin Itching   Codeine Itching   Fosphenytoin Itching   Levaquin [levofloxacin In D5w] Itching   Levofloxacin Itching   Lovastatin Itching   Penicillins    Documentation indicates severe reaction Pt tolerated cephalosporin without adverse reaction 09/18   Pravastatin Itching   Sulfa Antibiotics Itching      Medication List    STOP taking these medications   levETIRAcetam 250 MG tablet Commonly known as:  KEPPRA     TAKE these medications   albuterol 108 (90 Base) MCG/ACT inhaler Commonly known as:  PROVENTIL HFA;VENTOLIN HFA  Inhale 2 puffs into the lungs every 4 (four) hours as needed for wheezing or shortness of breath.   albuterol (2.5 MG/3ML) 0.083% nebulizer solution Commonly known as:  PROVENTIL Take 3 mLs (2.5 mg total) by nebulization every 4 (four) hours as needed for wheezing or shortness of breath.   citalopram 20 MG tablet Commonly known as:  CELEXA Take 1 tablet (20 mg total) by mouth daily.   clopidogrel 75 MG tablet Commonly known as:  PLAVIX Take 1 tablet (75 mg total) by mouth daily.   diltiazem 180 MG 24 hr  capsule Commonly known as:  DILACOR XR Take 180 mg by mouth daily.   divalproex 250 MG DR tablet Commonly known as:  DEPAKOTE Take 1 tablet (250 mg total) by mouth every 12 (twelve) hours. What changed:  how much to take   feeding supplement (ENSURE ENLIVE) Liqd Take 237 mLs by mouth 2 (two) times daily between meals.   furosemide 40 MG tablet Commonly known as:  LASIX Take 1 tablet (40 mg total) by mouth daily. What changed:  how much to take   GLIPIZIDE XL 5 MG 24 hr tablet Generic drug:  glipiZIDE Take 1 tablet (5 mg total) by mouth daily.   lacosamide 200 MG Tabs tablet Commonly known as:  VIMPAT Take 1 tablet (200 mg total) by mouth 2 (two) times daily.   magnesium oxide 400 (241.3 Mg) MG tablet Commonly known as:  MAG-OX Take 1 tablet (400 mg total) by mouth daily.   mometasone-formoterol 200-5 MCG/ACT Aero Commonly known as:  DULERA Inhale 2 puffs into the lungs 2 (two) times daily.   montelukast 10 MG tablet Commonly known as:  SINGULAIR Take 1 tablet by mouth at bedtime.   omeprazole 20 MG capsule Commonly known as:  PRILOSEC Take 20 mg by mouth daily.   OXcarbazepine 150 MG tablet Commonly known as:  TRILEPTAL Take 1 tablet (150 mg total) by mouth 2 (two) times daily.   rOPINIRole 0.25 MG tablet Commonly known as:  REQUIP Take 1 tablet (0.25 mg total) by mouth 3 (three) times daily.   rosuvastatin 10 MG tablet Commonly known as:  CRESTOR Take 10 mg by mouth daily.        DISCHARGE INSTRUCTIONS:   Follow with PMD in 1-2 weeks.  If you experience worsening of your admission symptoms, develop shortness of breath, life threatening emergency, suicidal or homicidal thoughts you must seek medical attention immediately by calling 911 or calling your MD immediately  if symptoms less severe.  You Must read complete instructions/literature along with all the possible adverse reactions/side effects for all the Medicines you take and that have been  prescribed to you. Take any new Medicines after you have completely understood and accept all the possible adverse reactions/side effects.   Please note  You were cared for by a hospitalist during your hospital stay. If you have any questions about your discharge medications or the care you received while you were in the hospital after you are discharged, you can call the unit and asked to speak with the hospitalist on call if the hospitalist that took care of you is not available. Once you are discharged, your primary care physician will handle any further medical issues. Please note that NO REFILLS for any discharge medications will be authorized once you are discharged, as it is imperative that you return to your primary care physician (or establish a relationship with a primary care physician if you do not have one) for  your aftercare needs so that they can reassess your need for medications and monitor your lab values.    Today   CHIEF COMPLAINT:   Chief Complaint  Patient presents with  . Seizures    HISTORY OF PRESENT ILLNESS:  [name]  is a 66 y.o. female with a known history of seizures, nonalcoholic cirrhosis, diabetes comes to the emergency room brought by EMS after neighbors found patient unresponsive in her bathroom at home.  Patient currently sedated after she received antiseizure meds in the emergency room.  No family [place].  Spoke with daughter Penny Pia who gave the above information  Per dter Kenney Houseman patient is a DNR   VITAL SIGNS:  Blood pressure (!) 153/92, pulse (!) 52, temperature 98.4 F (36.9 C), temperature source Oral, resp. rate 20, height _0  (1.575 m), weight 77.6 kg (171 lb), SpO2 92 %.  I/O:  No intake or output data in the 24 hours ending 06/20/17 0209  PHYSICAL EXAMINATION:  GENERAL:  66 y.o.-year-old patient lying in the bed with no acute distress.  EYES: Pupils equal, round, reactive to light and accommodation. No scleral icterus.  Extraocular muscles intact.  HEENT: Head atraumatic, normocephalic. Oropharynx and nasopharynx clear.  NECK:  Supple, no jugular venous distention. No thyroid enlargement, no tenderness.  LUNGS: Normal breath sounds bilaterally, no wheezing, rales,rhonchi or crepitation. No use of accessory muscles of respiration.  CARDIOVASCULAR: S1, S2 normal. No murmurs, rubs, or gallops.  ABDOMEN: Soft, non-tender, non-distended. Bowel sounds present. No organomegaly or mass.  EXTREMITIES: No pedal edema, cyanosis, or clubbing.  NEUROLOGIC: Cranial nerves II through XII are intact. Muscle strength 5/5 in all extremities. Sensation intact. Gait not checked.  PSYCHIATRIC: The patient is alert and oriented x 3.  SKIN: No obvious rash, lesion, or ulcer.   DATA REVIEW:   CBC Recent Labs  Lab 06/15/17 1545  WBC 5.7  HGB 14.9  HCT 42.1  PLT 191    Chemistries  Recent Labs  Lab 06/15/17 1545  06/17/17 0507  NA 140  --  142  K 2.4*   < > 3.3*  CL 98*  --  107  CO2 31  --  29  GLUCOSE 199*  --  114*  BUN 13  --  11  CREATININE 0.53  --  0.53  CALCIUM 9.0  --  8.7*  MG  --    < > 2.0  AST 32  --   --   ALT 14  --   --   ALKPHOS 160*  --   --   BILITOT 0.6  --   --    < > = values in this interval not displayed.    Cardiac Enzymes Recent Labs  Lab 06/15/17 1545  TROPONINI <0.03    Microbiology Results  Results for orders placed or performed during the hospital encounter of 06/15/17  MRSA PCR Screening     Status: Abnormal   Collection Time: 06/16/17 11:22 AM  Result Value Ref Range Status   MRSA by PCR POSITIVE (A) NEGATIVE Final    Comment:        The GeneXpert MRSA Assay (FDA approved for NASAL specimens only), is one component of a comprehensive MRSA colonization surveillance program. It is not intended to diagnose MRSA infection nor to guide or monitor treatment for MRSA infections. RESULT CALLED TO, READ BACK BY AND VERIFIED WITH:  WAYNETTE MILES 1252 06/16/17 [facility]    C difficile quick scan w PCR reflex  Status: Abnormal   Collection Time: 06/16/17 11:23 AM  Result Value Ref Range Status   C Diff antigen POSITIVE (A) NEGATIVE Final   C Diff toxin NEGATIVE NEGATIVE Final   C Diff interpretation Results are indeterminate. See PCR results.  Final  Gastrointestinal Panel by PCR , Stool     Status: None   Collection Time: 06/16/17 11:23 AM  Result Value Ref Range Status   Campylobacter species NOT DETECTED NOT DETECTED Final   Plesimonas shigelloides NOT DETECTED NOT DETECTED Final   Salmonella species NOT DETECTED NOT DETECTED Final   Yersinia enterocolitica NOT DETECTED NOT DETECTED Final   Vibrio species NOT DETECTED NOT DETECTED Final   Vibrio cholerae NOT DETECTED NOT DETECTED Final   Enteroaggregative E coli (EAEC) NOT DETECTED NOT DETECTED Final   Enteropathogenic E coli (EPEC) NOT DETECTED NOT DETECTED Final   Enterotoxigenic E coli (ETEC) NOT DETECTED NOT DETECTED Final   Shiga like toxin producing E coli (STEC) NOT DETECTED NOT DETECTED Final   Shigella/Enteroinvasive E coli (EIEC) NOT DETECTED NOT DETECTED Final   Cryptosporidium NOT DETECTED NOT DETECTED Final   Cyclospora cayetanensis NOT DETECTED NOT DETECTED Final   Entamoeba histolytica NOT DETECTED NOT DETECTED Final   Giardia lamblia NOT DETECTED NOT DETECTED Final   Adenovirus F40/41 NOT DETECTED NOT DETECTED Final   Astrovirus NOT DETECTED NOT DETECTED Final   Norovirus GI/GII NOT DETECTED NOT DETECTED Final   Rotavirus A NOT DETECTED NOT DETECTED Final   Sapovirus (I, II, IV, and V) NOT DETECTED NOT DETECTED Final  Clostridium Difficile by PCR     Status: None   Collection Time: 06/16/17 11:23 AM  Result Value Ref Range Status   Toxigenic C. Difficile by PCR NEGATIVE NEGATIVE Final    RADIOLOGY:  No results found.  EKG:   Orders placed or performed during the hospital encounter of 06/15/17  . EKG 12-Lead  . EKG 12-Lead      Management plans discussed with the  patient, family and they are in agreement.  CODE STATUS:  Code Status History    Date Active Date Inactive Code Status Order ID Comments User Context   06/15/2017 20:15 06/17/2017 16:27 Full Code [id number]  Fritzi Mandes, MD Inpatient   06/15/2017 18:47 06/15/2017 20:15 DNR [id number]  Fritzi Mandes, MD ED   05/04/2017 08:35 05/07/2017 18:32 DNR [id number]  Max Sane, MD Inpatient   04/29/2017 19:15 05/04/2017 08:35 Full Code [id number]  Angela Adam, RN Inpatient   03/25/2017 02:35 03/30/2017 19:07 Full Code [id number]  Harvie Bridge, DO Inpatient   02/11/2017 21:31 02/12/2017 16:36 Full Code [id number]  Demetrios Loll, MD Inpatient   07/11/2016 11:31 07/17/2016 15:08 Partial Code [id number]  Marijo Conception, RN Inpatient   07/11/2016 02:37 07/11/2016 11:31 Full Code [id number]  Harvie Bridge, DO Inpatient   03/22/2016 22:44 03/24/2016 19:12 Partial Code [id number]  Toy Baker, MD Inpatient   03/29/2015 11:30 03/31/2015 14:21 Partial Code [id number]  Loletha Grayer, MD ED    Advance Directive Documentation     Most Recent Value  Type of Advance Directive  Healthcare Power of Attorney  Pre-existing out of facility DNR order (yellow form or pink MOST form)  No data  "MOST" Form in Place?  No data      TOTAL TIME TAKING CARE OF THIS PATIENT: 35 minutes.    Vaughan Basta M.D on 06/20/2017 at 2:09 AM  Between 7am to 6pm - Pager - [phone]  After 6pm go to [email or web address] - [place]  [facility]  Office  [phone]  CC: Primary care physician; [facility]   Note: This dictation was prepared with Dragon dictation along with smaller phrase technology. Any transcriptional errors that result from this process are unintentional.

## 2017-06-24 DIAGNOSIS — E119 Type 2 diabetes mellitus without complications: Secondary | ICD-10-CM | POA: Diagnosis not present

## 2017-06-24 DIAGNOSIS — R7309 Other abnormal glucose: Secondary | ICD-10-CM | POA: Diagnosis not present

## 2017-06-24 DIAGNOSIS — Z1389 Encounter for screening for other disorder: Secondary | ICD-10-CM | POA: Diagnosis not present

## 2017-06-24 DIAGNOSIS — R569 Unspecified convulsions: Secondary | ICD-10-CM | POA: Diagnosis not present

## 2017-06-24 DIAGNOSIS — E1140 Type 2 diabetes mellitus with diabetic neuropathy, unspecified: Secondary | ICD-10-CM | POA: Diagnosis not present

## 2017-06-24 DIAGNOSIS — Z1159 Encounter for screening for other viral diseases: Secondary | ICD-10-CM | POA: Diagnosis not present

## 2017-06-24 DIAGNOSIS — R197 Diarrhea, unspecified: Secondary | ICD-10-CM | POA: Diagnosis not present

## 2017-06-25 ENCOUNTER — Other Ambulatory Visit: Payer: Self-pay

## 2017-06-25 ENCOUNTER — Inpatient Hospital Stay
Admission: EM | Admit: 2017-06-25 | Discharge: 2017-06-27 | DRG: 641 | Disposition: A | Discharge status: Home Health | Payer: Medicare HMO | Attending: Internal Medicine | Admitting: Internal Medicine

## 2017-06-25 DIAGNOSIS — I10 Essential (primary) hypertension: Secondary | ICD-10-CM | POA: Diagnosis present

## 2017-06-25 DIAGNOSIS — E876 Hypokalemia: Principal | ICD-10-CM | POA: Diagnosis present

## 2017-06-25 DIAGNOSIS — J449 Chronic obstructive pulmonary disease, unspecified: Secondary | ICD-10-CM | POA: Diagnosis present

## 2017-06-25 DIAGNOSIS — Z882 Allergy status to sulfonamides status: Secondary | ICD-10-CM

## 2017-06-25 DIAGNOSIS — Z7902 Long term (current) use of antithrombotics/antiplatelets: Secondary | ICD-10-CM | POA: Diagnosis not present

## 2017-06-25 DIAGNOSIS — H9190 Unspecified hearing loss, unspecified ear: Secondary | ICD-10-CM | POA: Diagnosis present

## 2017-06-25 DIAGNOSIS — G40909 Epilepsy, unspecified, not intractable, without status epilepticus: Secondary | ICD-10-CM | POA: Diagnosis present

## 2017-06-25 DIAGNOSIS — F17210 Nicotine dependence, cigarettes, uncomplicated: Secondary | ICD-10-CM | POA: Diagnosis present

## 2017-06-25 DIAGNOSIS — Z888 Allergy status to other drugs, medicaments and biological substances status: Secondary | ICD-10-CM

## 2017-06-25 DIAGNOSIS — Z961 Presence of intraocular lens: Secondary | ICD-10-CM | POA: Diagnosis present

## 2017-06-25 DIAGNOSIS — G4730 Sleep apnea, unspecified: Secondary | ICD-10-CM | POA: Diagnosis present

## 2017-06-25 DIAGNOSIS — Z880 Allergy status to penicillin: Secondary | ICD-10-CM | POA: Diagnosis not present

## 2017-06-25 DIAGNOSIS — E119 Type 2 diabetes mellitus without complications: Secondary | ICD-10-CM | POA: Diagnosis not present

## 2017-06-25 DIAGNOSIS — Z9841 Cataract extraction status, right eye: Secondary | ICD-10-CM | POA: Diagnosis not present

## 2017-06-25 DIAGNOSIS — I471 Supraventricular tachycardia: Secondary | ICD-10-CM | POA: Diagnosis not present

## 2017-06-25 DIAGNOSIS — M069 Rheumatoid arthritis, unspecified: Secondary | ICD-10-CM | POA: Diagnosis not present

## 2017-06-25 DIAGNOSIS — K7460 Unspecified cirrhosis of liver: Secondary | ICD-10-CM | POA: Diagnosis present

## 2017-06-25 DIAGNOSIS — K219 Gastro-esophageal reflux disease without esophagitis: Secondary | ICD-10-CM | POA: Diagnosis present

## 2017-06-25 DIAGNOSIS — Z7984 Long term (current) use of oral hypoglycemic drugs: Secondary | ICD-10-CM | POA: Diagnosis not present

## 2017-06-25 DIAGNOSIS — R569 Unspecified convulsions: Secondary | ICD-10-CM | POA: Diagnosis not present

## 2017-06-25 DIAGNOSIS — Z9981 Dependence on supplemental oxygen: Secondary | ICD-10-CM

## 2017-06-25 DIAGNOSIS — Z8673 Personal history of transient ischemic attack (TIA), and cerebral infarction without residual deficits: Secondary | ICD-10-CM | POA: Diagnosis not present

## 2017-06-25 DIAGNOSIS — Z886 Allergy status to analgesic agent status: Secondary | ICD-10-CM

## 2017-06-25 DIAGNOSIS — R197 Diarrhea, unspecified: Secondary | ICD-10-CM | POA: Diagnosis not present

## 2017-06-25 DIAGNOSIS — G2581 Restless legs syndrome: Secondary | ICD-10-CM | POA: Diagnosis present

## 2017-06-25 DIAGNOSIS — M150 Primary generalized (osteo)arthritis: Secondary | ICD-10-CM | POA: Diagnosis not present

## 2017-06-25 DIAGNOSIS — Z9842 Cataract extraction status, left eye: Secondary | ICD-10-CM | POA: Diagnosis not present

## 2017-06-25 LAB — CBC WITH DIFFERENTIAL/PLATELET
Basophils Absolute: 0 10*3/uL (ref 0–0.1)
Basophils Relative: 1 %
Eosinophils Absolute: 0.1 10*3/uL (ref 0–0.7)
Eosinophils Relative: 2 %
HEMATOCRIT: 41.1 % (ref 35.0–47.0)
HEMOGLOBIN: 14 g/dL (ref 12.0–16.0)
LYMPHS ABS: 2.6 10*3/uL (ref 1.0–3.6)
LYMPHS PCT: 39 %
MCH: 28.5 pg (ref 26.0–34.0)
MCHC: 34 g/dL (ref 32.0–36.0)
MCV: 83.9 fL (ref 80.0–100.0)
MONOS PCT: 8 %
Monocytes Absolute: 0.5 10*3/uL (ref 0.2–0.9)
NEUTROS PCT: 50 %
Neutro Abs: 3.4 10*3/uL (ref 1.4–6.5)
Platelets: 199 10*3/uL (ref 150–440)
RBC: 4.89 MIL/uL (ref 3.80–5.20)
RDW: 14.8 % — ABNORMAL HIGH (ref 11.5–14.5)
WBC: 6.7 10*3/uL (ref 3.6–11.0)

## 2017-06-25 LAB — BASIC METABOLIC PANEL
Anion gap: 9 (ref 5–15)
BUN: 11 mg/dL (ref 6–20)
CHLORIDE: 101 mmol/L (ref 101–111)
CO2: 30 mmol/L (ref 22–32)
Calcium: 8.4 mg/dL — ABNORMAL LOW (ref 8.9–10.3)
Creatinine, Ser: 0.44 mg/dL (ref 0.44–1.00)
GFR calc non Af Amer: >60 mL/min (ref >60)
Glucose, Bld: 206 mg/dL — ABNORMAL HIGH (ref 65–99)
POTASSIUM: 2.2 mmol/L — AB (ref 3.5–5.1)
SODIUM: 140 mmol/L (ref 135–145)

## 2017-06-25 MED ORDER — POTASSIUM CHLORIDE CRYS ER 20 MEQ PO TBCR
20.0000 meq | EXTENDED_RELEASE_TABLET | Freq: Once | ORAL | Status: DC
  Filled 2017-06-25: qty 1

## 2017-06-25 MED ORDER — POTASSIUM CHLORIDE 10 MEQ/100ML IV SOLN
10.0000 meq | Freq: Once | INTRAVENOUS | Status: AC
  Administered 2017-06-25: 10 meq via INTRAVENOUS
  Filled 2017-06-25: qty 100

## 2017-06-25 NOTE — ED Triage Notes (Addendum)
Pt had blood drawn by pmd Wednesday and was called today with potassium of 2.7. No complaints at this time was routine blood work. Pt is deaf but can read lips and sign, daughter with patient and is able to interpret.

## 2017-06-25 NOTE — ED Notes (Signed)
Date and time results received: 06/25/17 2238 (use smartphrase ".now" to insert current time)  Test: potassium Critical Value: 2.2  Name of Provider Notified: Siadecki  Orders Received? Or Actions Taken?: Orders Received - See Orders for details

## 2017-06-25 NOTE — H&P (Signed)
[facility] [place] NAME: [name]    MR#:  [id number]  DATE OF BIRTH:  [birth year]  DATE OF ADMISSION:  06/25/2017  PRIMARY CARE PHYSICIAN: [facility] [place]   REQUESTING/REFERRING PHYSICIAN:   CHIEF COMPLAINT:   Chief Complaint  Patient presents with  . Abnormal Lab    HISTORY OF PRESENT ILLNESS: [name]  is a 66 y.o. female with a known history per below which includes hypokalemia, sent from primary care providers office for low potassium of 2.7, in the emergency room potassium was 2.2, T waves noted on EKG, patient in no apparent distress, patient now being admitted for acute on chronic hypokalemia.  PAST MEDICAL HISTORY:   Past Medical History:  Diagnosis Date  . Asthma   . Cirrhosis, non-alcoholic ([place])   . COPD (chronic obstructive pulmonary disease) ([place])   . Deaf   . Depression   . Diabetes mellitus without complication ([place])   . GERD (gastroesophageal reflux disease)   . Heart murmur   . Hepatitis   . Hypertension   . Lymph node disorder    arm  . Neuropathy   . On home oxygen therapy    hs  . Orthopnea   . RLS (restless legs syndrome)   . Seizures ([place])   . Shortness of breath dyspnea   . Sleep apnea   . Stroke [facility])    tia    PAST SURGICAL HISTORY:  Past Surgical History:  Procedure Laterality Date  . CATARACT EXTRACTION W/PHACO Right 11/23/2014   Procedure: CATARACT EXTRACTION PHACO AND INTRAOCULAR LENS PLACEMENT (IOC);  Surgeon: Lyla Glassing, MD;  Location: [facility] ORS;  Service: Ophthalmology;  Laterality: Right;  . CATARACT EXTRACTION W/PHACO Left 12/14/2014   Procedure: CATARACT EXTRACTION PHACO AND INTRAOCULAR LENS PLACEMENT (IOC);  Surgeon: Lyla Glassing, MD;  Location: [facility] ORS;  Service: Ophthalmology;  Laterality: Left;  US:01:16.6 AP:15.8 CDE:12.14  . CESAREAN SECTION    . CHOLECYSTECTOMY    . KNEE ARTHROPLASTY    . THUMB ARTHROSCOPY    . TONSILLECTOMY    .  TYMPANOPLASTY     muliple    SOCIAL HISTORY:  Social History   Tobacco Use  . Smoking status: Current Every Day Smoker    Packs/day: 0.50  . Smokeless tobacco: Never Used  Substance Use Topics  . Alcohol use: No    FAMILY HISTORY:  Family History  Problem Relation Age of Onset  . Lung cancer Mother   . CAD Father     DRUG ALLERGIES:  Allergies  Allergen Reactions  . Aspirin Itching  . Celebrex [Celecoxib] Itching    itching  . Ciprofloxacin Itching  . Codeine Itching  . Fosphenytoin Itching  . Levaquin [Levofloxacin In D5w] Itching  . Levofloxacin Itching  . Lovastatin Itching  . Penicillins     Documentation indicates severe reaction  Pt tolerated cephalosporin without adverse reaction 09/18   . Pravastatin Itching  . Sulfa Antibiotics Itching    REVIEW OF SYSTEMS:   CONSTITUTIONAL: No fever, fatigue or weakness.  EYES: No blurred or double vision.  EARS, NOSE, AND THROAT: No tinnitus or ear pain.  RESPIRATORY: No cough, shortness of breath, wheezing or hemoptysis.  CARDIOVASCULAR: No chest pain, orthopnea, edema.  GASTROINTESTINAL: No nausea, vomiting, diarrhea or abdominal pain.  GENITOURINARY: No dysuria, hematuria.  ENDOCRINE: No polyuria, nocturia,  HEMATOLOGY: No anemia, easy bruising or bleeding SKIN: No rash or lesion. MUSCULOSKELETAL: No joint pain or arthritis.  NEUROLOGIC: No tingling, numbness, weakness. Deaf PSYCHIATRY: No anxiety or depression.   MEDICATIONS AT HOME:  Prior to Admission medications   Medication Sig Start Date End Date Taking? Authorizing Provider  albuterol (PROVENTIL HFA;VENTOLIN HFA) 108 (90 BASE) MCG/ACT inhaler Inhale 2 puffs into the lungs every 4 (four) hours as needed for wheezing or shortness of breath.    [provider]  albuterol (PROVENTIL) (2.5 MG/3ML) 0.083% nebulizer solution Take 3 mLs (2.5 mg total) by nebulization every 4 (four) hours as needed for wheezing or shortness of breath. 07/17/16    Vaughan Basta, MD  citalopram (CELEXA) 20 MG tablet Take 1 tablet (20 mg total) by mouth daily. 07/17/16   Vaughan Basta, MD  clopidogrel (PLAVIX) 75 MG tablet Take 1 tablet (75 mg total) by mouth daily. 07/17/16   Vaughan Basta, MD  diltiazem (DILACOR XR) 180 MG 24 hr capsule Take 180 mg by mouth daily.    [provider]  divalproex (DEPAKOTE) 250 MG DR tablet Take 1 tablet (250 mg total) by mouth every 12 (twelve) hours. 06/17/17   Vaughan Basta, MD  feeding supplement, ENSURE ENLIVE, (ENSURE ENLIVE) LIQD Take 237 mLs by mouth 2 (two) times daily between meals. 06/17/17   Vaughan Basta, MD  furosemide (LASIX) 40 MG tablet Take 1 tablet (40 mg total) by mouth daily. Patient taking differently: Take 20 mg by mouth daily.  07/17/16   Vaughan Basta, MD  GLIPIZIDE XL 5 MG 24 hr tablet Take 1 tablet (5 mg total) by mouth daily. 05/07/17   Loletha Grayer, MD  lacosamide (VIMPAT) 200 MG TABS tablet Take 1 tablet (200 mg total) by mouth 2 (two) times daily. 05/07/17   Loletha Grayer, MD  magnesium oxide (MAG-OX) 400 (241.3 Mg) MG tablet Take 1 tablet (400 mg total) by mouth daily. 05/07/17   Loletha Grayer, MD  mometasone-formoterol (DULERA) 200-5 MCG/ACT AERO Inhale 2 puffs into the lungs 2 (two) times daily. 07/17/16   Vaughan Basta, MD  montelukast (SINGULAIR) 10 MG tablet Take 1 tablet by mouth at bedtime. 03/18/16   [provider]  omeprazole (PRILOSEC) 20 MG capsule Take 20 mg by mouth daily.    [provider]  OXcarbazepine (TRILEPTAL) 150 MG tablet Take 1 tablet (150 mg total) by mouth 2 (two) times daily. 06/17/17   Vaughan Basta, MD  rOPINIRole (REQUIP) 0.25 MG tablet Take 1 tablet (0.25 mg total) by mouth 3 (three) times daily. 03/24/16   Oswald Hillock, MD  rosuvastatin (CRESTOR) 10 MG tablet Take 10 mg by mouth daily. 04/21/17   [provider]      PHYSICAL EXAMINATION:   VITAL  SIGNS: Blood pressure (!) 144/63, pulse (!) 55, temperature 98.2 F (36.8 C), temperature source Oral, resp. rate 15, height _0  (1.6 m), weight 64.4 kg (142 lb), SpO2 91 %.  GENERAL:  66 y.o.-year-old patient lying in the bed with no acute distress.  EYES: Pupils equal, round, reactive to light and accommodation. No scleral icterus. Extraocular muscles intact.  HEENT: Head atraumatic, normocephalic. Oropharynx and nasopharynx clear.  NECK:  Supple, no jugular venous distention. No thyroid enlargement, no tenderness.  LUNGS: Normal breath sounds bilaterally, no wheezing, rales,rhonchi or crepitation. No use of accessory muscles of respiration.  CARDIOVASCULAR: S1, S2 normal. No murmurs, rubs, or gallops.  ABDOMEN: Soft, nontender, nondistended. Bowel sounds present. No organomegaly or mass.  EXTREMITIES: No pedal edema, cyanosis, or clubbing.  NEUROLOGIC: Cranial nerves II through XII are intact. MAES. Gait not checked. Deaf  PSYCHIATRIC: The patient is alert and oriented x 3.  SKIN: No obvious rash, lesion, or ulcer.   LABORATORY PANEL:   CBC Recent Labs  Lab 06/25/17 2201  WBC 6.7  HGB 14.0  HCT 41.1  PLT 199  MCV 83.9  MCH 28.5  MCHC 34.0  RDW 14.8*  LYMPHSABS 2.6  MONOABS 0.5  EOSABS 0.1  BASOSABS 0.0   ------------------------------------------------------------------------------------------------------------------  Chemistries  Recent Labs  Lab 06/25/17 2201  NA 140  K 2.2*  CL 101  CO2 30  GLUCOSE 206*  BUN 11  CREATININE 0.44  CALCIUM 8.4*   ------------------------------------------------------------------------------------------------------------------ estimated creatinine clearance is 63.3 mL/min (by C-G formula based on SCr of 0.44 mg/dL). ------------------------------------------------------------------------------------------------------------------ No results for input(s): TSH, T4TOTAL, T3FREE, THYROIDAB in the last 72 hours.  Invalid input(s):  FREET3   Coagulation profile No results for input(s): INR, PROTIME in the last 168 hours. ------------------------------------------------------------------------------------------------------------------- No results for input(s): DDIMER in the last 72 hours. -------------------------------------------------------------------------------------------------------------------  Cardiac Enzymes No results for input(s): CKMB, TROPONINI, MYOGLOBIN in the last 168 hours.  Invalid input(s): CK ------------------------------------------------------------------------------------------------------------------ Invalid input(s): POCBNP  ---------------------------------------------------------------------------------------------------------------  Urinalysis    Component Value Date/Time   COLORURINE YELLOW (A) 06/16/2017 0202   APPEARANCEUR HAZY (A) 06/16/2017 0202   APPEARANCEUR Clear 11/14/2011 1607   LABSPEC 1.016 06/16/2017 0202   LABSPEC 1.006 11/14/2011 1607   PHURINE 6.0 06/16/2017 0202   GLUCOSEU NEGATIVE 06/16/2017 0202   GLUCOSEU Negative 11/14/2011 1607   HGBUR NEGATIVE 06/16/2017 0202   BILIRUBINUR NEGATIVE 06/16/2017 0202   BILIRUBINUR Negative 11/14/2011 1607   KETONESUR NEGATIVE 06/16/2017 0202   PROTEINUR NEGATIVE 06/16/2017 0202   NITRITE NEGATIVE 06/16/2017 0202   LEUKOCYTESUR TRACE (A) 06/16/2017 0202   LEUKOCYTESUR Negative 11/14/2011 1607     RADIOLOGY: No results found.  EKG: Orders placed or performed during the hospital encounter of 06/25/17  . ED EKG  . ED EKG  . EKG 12-Lead  . EKG 12-Lead    IMPRESSION AND PLAN: 1 acute on chronic hypokalemia Replete with IV/p.o. potassium, check magnesium level/BMP in the morning  2 chronic deafness Patient does read lips  Supportive care  3 COPD without exacerbation Breathing treatments as needed  4 chronic seizure disorder Stable Continue home regiment  Full code Condition stable Prognosis fair DVT  prophylaxis with Lovenox subcu Disposition home in the next 1-2 days barring any complications  All the records are reviewed and case discussed with ED provider. Management plans discussed with the patient, family and they are in agreement.  CODE STATUS: Code Status History    Date Active Date Inactive Code Status Order ID Comments User Context   06/15/2017 20:15 06/17/2017 16:27 Full Code [id number]  Fritzi Mandes, MD Inpatient   06/15/2017 18:47 06/15/2017 20:15 DNR [id number]  Fritzi Mandes, MD ED   05/04/2017 08:35 05/07/2017 18:32 DNR [id number]  Max Sane, MD Inpatient   04/29/2017 19:15 05/04/2017 08:35 Full Code [id number]  Angela Adam, RN Inpatient   03/25/2017 02:35 03/30/2017 19:07 Full Code [id number]  Harvie Bridge, DO Inpatient   02/11/2017 21:31 02/12/2017 16:36 Full Code [id number]  Demetrios Loll, MD Inpatient   07/11/2016 11:31 07/17/2016 15:08 Partial Code [id number]  Marijo Conception, RN Inpatient   07/11/2016 02:37 07/11/2016 11:31 Full Code [id number]  Harvie Bridge, DO Inpatient   03/22/2016 22:44 03/24/2016 19:12 Partial Code [id number]  Toy Baker, MD Inpatient   03/29/2015 11:30 03/31/2015 14:21 Partial Code [id number]  Loletha Grayer, MD ED       TOTAL TIME TAKING  CARE OF THIS PATIENT: 40 minutes.    Avel Peace Aysa Larivee M.D on 06/25/2017   Between 7am to 6pm - Pager - [phone]  After 6pm go to [email or web address] - password [email or web address] [facility] [facility]  Office  [phone]  CC: Primary care physician; [facility]   Note: This dictation was prepared with Dragon dictation along with smaller phrase technology. Any transcriptional errors that result from this process are unintentional.

## 2017-06-25 NOTE — ED Provider Notes (Signed)
[facility] Emergency Department Provider Note ____________________________________________   First MD Initiated Contact with Patient 06/25/17 2143     (approximate)  I have reviewed the triage vital signs and the nursing notes.   HISTORY  Chief Complaint Abnormal Lab    HPI [name] is a 66 y.o. female with a past medical history of hypokalemia, seizure disorder, and other PMH as noted below who presents for hypokalemia, unclear onset, measured to 2.7 on labs drawn yesterday, and not associated with any significant symptoms.  Patient has history of hypokalemia in the past, and was recently admitted for this as well as for seizure; she states that she was on potassium repletion until several days ago.  She states that the doctor called and told her to come to the emergency department and that she would likely need admission.   Past Medical History:  Diagnosis Date  . Asthma   . Cirrhosis, non-alcoholic ([place])   . COPD (chronic obstructive pulmonary disease) ([place])   . Deaf   . Depression   . Diabetes mellitus without complication ([place])   . GERD (gastroesophageal reflux disease)   . Heart murmur   . Hepatitis   . Hypertension   . Lymph node disorder    arm  . Neuropathy   . On home oxygen therapy    hs  . Orthopnea   . RLS (restless legs syndrome)   . Seizures ([place])   . Shortness of breath dyspnea   . Sleep apnea   . Stroke [facility])    tia    Patient Active Problem List   Diagnosis Date Noted  . Seizures ([place]) 06/15/2017  . Goals of care, counseling/discussion 05/06/2017  . Status epilepticus ([place])   . HCAP (healthcare-associated pneumonia)   . Community acquired pneumonia of left lower lobe of lung ([place])   . Hypoxia   . Acute on chronic respiratory failure with hypoxia ([place]) 03/24/2017  . Hypokalemia 02/12/2017  . Sepsis ([place]) 02/11/2017  . GERD (gastroesophageal reflux disease) 03/23/2016  . Depression 03/23/2016  .  Convulsions/seizures ([place]) 03/22/2016  . DM (diabetes mellitus), type 2 ([place]) 03/22/2016  . Essential hypertension 03/22/2016  . Neuropathy 03/22/2016  . Convulsions ([place]) 03/22/2016  . COPD exacerbation ([place]) 03/31/2015  . Acute respiratory failure with hypoxia ([place]) 03/29/2015    Past Surgical History:  Procedure Laterality Date  . CATARACT EXTRACTION W/PHACO Right 11/23/2014   Procedure: CATARACT EXTRACTION PHACO AND INTRAOCULAR LENS PLACEMENT (IOC);  Surgeon: Lyla Glassing, MD;  Location: [facility] ORS;  Service: Ophthalmology;  Laterality: Right;  . CATARACT EXTRACTION W/PHACO Left 12/14/2014   Procedure: CATARACT EXTRACTION PHACO AND INTRAOCULAR LENS PLACEMENT (IOC);  Surgeon: Lyla Glassing, MD;  Location: [facility] ORS;  Service: Ophthalmology;  Laterality: Left;  US:01:16.6 AP:15.8 CDE:12.14  . CESAREAN SECTION    . CHOLECYSTECTOMY    . KNEE ARTHROPLASTY    . THUMB ARTHROSCOPY    . TONSILLECTOMY    . TYMPANOPLASTY     muliple    Prior to Admission medications   Medication Sig Start Date End Date Taking? Authorizing Provider  albuterol (PROVENTIL HFA;VENTOLIN HFA) 108 (90 BASE) MCG/ACT inhaler Inhale 2 puffs into the lungs every 4 (four) hours as needed for wheezing or shortness of breath.    [provider]  albuterol (PROVENTIL) (2.5 MG/3ML) 0.083% nebulizer solution Take 3 mLs (2.5 mg total) by nebulization every 4 (four) hours as needed for wheezing or shortness of breath. 07/17/16   Vaughan Basta, MD  citalopram (CELEXA) 20 MG tablet Take 1 tablet (20 mg total) by mouth daily. 07/17/16   Vaughan Basta, MD  clopidogrel (PLAVIX) 75 MG tablet Take 1 tablet (75 mg total) by mouth daily. 07/17/16   Vaughan Basta, MD  diltiazem (DILACOR XR) 180 MG 24 hr capsule Take 180 mg by mouth daily.    [provider]  divalproex (DEPAKOTE) 250 MG DR tablet Take 1 tablet (250 mg total) by mouth every 12 (twelve) hours. 06/17/17   Vaughan Basta, MD   feeding supplement, ENSURE ENLIVE, (ENSURE ENLIVE) LIQD Take 237 mLs by mouth 2 (two) times daily between meals. 06/17/17   Vaughan Basta, MD  furosemide (LASIX) 40 MG tablet Take 1 tablet (40 mg total) by mouth daily. Patient taking differently: Take 20 mg by mouth daily.  07/17/16   Vaughan Basta, MD  GLIPIZIDE XL 5 MG 24 hr tablet Take 1 tablet (5 mg total) by mouth daily. 05/07/17   Loletha Grayer, MD  lacosamide (VIMPAT) 200 MG TABS tablet Take 1 tablet (200 mg total) by mouth 2 (two) times daily. 05/07/17   Loletha Grayer, MD  magnesium oxide (MAG-OX) 400 (241.3 Mg) MG tablet Take 1 tablet (400 mg total) by mouth daily. 05/07/17   Loletha Grayer, MD  mometasone-formoterol (DULERA) 200-5 MCG/ACT AERO Inhale 2 puffs into the lungs 2 (two) times daily. 07/17/16   Vaughan Basta, MD  montelukast (SINGULAIR) 10 MG tablet Take 1 tablet by mouth at bedtime. 03/18/16   [provider]  omeprazole (PRILOSEC) 20 MG capsule Take 20 mg by mouth daily.    [provider]  OXcarbazepine (TRILEPTAL) 150 MG tablet Take 1 tablet (150 mg total) by mouth 2 (two) times daily. 06/17/17   Vaughan Basta, MD  rOPINIRole (REQUIP) 0.25 MG tablet Take 1 tablet (0.25 mg total) by mouth 3 (three) times daily. 03/24/16   Oswald Hillock, MD  rosuvastatin (CRESTOR) 10 MG tablet Take 10 mg by mouth daily. 04/21/17   [provider]    Allergies Aspirin; Celebrex [celecoxib]; Ciprofloxacin; Codeine; Fosphenytoin; Levaquin [levofloxacin in d5w]; Levofloxacin; Lovastatin; Penicillins; Pravastatin; and Sulfa antibiotics  Family History  Problem Relation Age of Onset  . Lung cancer Mother   . CAD Father     Social History Social History   Tobacco Use  . Smoking status: Current Every Day Smoker    Packs/day: 0.50  . Smokeless tobacco: Never Used  Substance Use Topics  . Alcohol use: No  . Drug use: No    Review of Systems  Constitutional: No fever.   No weakness. Eyes: No visual changes. ENT: No sore throat. Cardiovascular: Denies chest pain. Respiratory: Denies shortness of breath. Gastrointestinal: No nausea, no vomiting.  Genitourinary: Negative for frequency.  Musculoskeletal: Negative for back pain. Skin: Negative for rash. Neurological: Negative for headache.  Negative for seizures.   ____________________________________________   PHYSICAL EXAM:  VITAL SIGNS: ED Triage Vitals  Enc Vitals Group     BP 06/25/17 2122 (!) 146/65     Pulse Rate 06/25/17 2122 69     Resp 06/25/17 2122 18     Temp 06/25/17 2122 98.2 F (36.8 C)     Temp Source 06/25/17 2122 Oral     SpO2 06/25/17 2122 93 %     Weight 06/25/17 2122 142 lb (64.4 kg)     Height 06/25/17 2122 _0  (1.6 m)     Head Circumference --      Peak Flow --  Pain Score 06/25/17 2142 0     Pain Loc --      Pain Edu? --      Excl. in [place]? --     Constitutional: Alert and oriented. Well appearing and in no acute distress. Eyes: Conjunctivae are normal.  Head: Atraumatic. Nose: No congestion/rhinnorhea. Mouth/Throat: Mucous membranes are moist.   Neck: Normal range of motion.  Cardiovascular: Normal rate, regular rhythm. Grossly normal heart sounds.  Good peripheral circulation. Respiratory: Normal respiratory effort.  No retractions. Lungs CTAB. Gastrointestinal: Soft and nontender. No distention.  Genitourinary: No flank tenderness. Musculoskeletal: No lower extremity edema.  Extremities warm and well perfused.  Neurologic:  Normal speech and language. No gross focal neurologic deficits are appreciated.  Skin:  Skin is warm and dry. No rash noted. Psychiatric: Mood and affect are normal. Speech and behavior are normal.  ____________________________________________   LABS (all labs ordered are listed, but only abnormal results are displayed)  Labs Reviewed  BASIC METABOLIC PANEL - Abnormal; Notable for the following components:      Result Value    Potassium 2.2 (*)    Glucose, Bld 206 (*)    Calcium 8.4 (*)    All other components within normal limits  CBC WITH DIFFERENTIAL/PLATELET - Abnormal; Notable for the following components:   RDW 14.8 (*)    All other components within normal limits   ____________________________________________  EKG  ED ECG REPORT I, Arta Silence, the attending physician, personally viewed and interpreted this ECG.  Date: 06/25/2017 EKG Time: 2141 Rate: 61 Rhythm: normal sinus rhythm QRS Axis: normal Intervals: normal ST/T Wave abnormalities: Nonspecific T wave inversions V1 through V4 Narrative Interpretation: no evidence of acute ischemia, and no U wave or abnormal QRS; no significant change when compared to EKG of 06/15/2017  ____________________________________________  RADIOLOGY    ____________________________________________   PROCEDURES  Procedure(s) performed: No    Critical Care performed: No ____________________________________________   INITIAL IMPRESSION / ASSESSMENT AND PLAN / ED COURSE  Pertinent labs & imaging results that were available during my care of the patient were reviewed by me and considered in my medical decision making (see chart for details).  66 year old female with past medical history as noted above presents for recurrent hypokalemia after discontinuing potassium repletion several days ago.  Patient had labs drawn yesterday but the potassium is found to be 2.7, so patient was referred to come to the emergency department.  I reviewed the patient's past medical records in epic.  Patient was most recently admitted earlier this month and discharged 8 days ago for seizure disorder and hypokalemia.  She was also admitted last month for status epilepticus as well as for hypokalemia.  From the notes that are available to me, the etiology of the hypokalemia is somewhat unclear.  Patient states that she has not had any recurrent seizures since her  discharge, and has been compliant with her medications.  On exam, vital signs are normal, the patient is comfortable appearing, other neuro exam is nonfocal, and the remainder the exam is unremarkable.  EKG shows no acute changes when compared to the EKG from 10 days ago.  At this time, patient appears to have isolated hypokalemia.  We will obtain basic labs to confirm the hypokalemia and rule out other electrolyte abnormalities.  Plan for likely IV and p.o. repletion, and then reassess.  May require admission for additional IV repletion based on results of the labs here.    ----------------------------------------- 11:09 PM on 06/25/2017 -----------------------------------------  Patient's K is in fact lower than it was when it was measured yesterday at her doctor's office, now 2.2.  Based on this relatively rapid decline we will admit for IV and p.o. repletion.  I signed the patient out to the hospitalist Dr. Clyda Hurdle, and I spent approximately 10 minutes discussing her care over the phone with her daughter and answering her questions.  ____________________________________________   FINAL CLINICAL IMPRESSION(S) / ED DIAGNOSES  Final diagnoses:  Hypokalemia      NEW MEDICATIONS STARTED DURING THIS VISIT:  This SmartLink is deprecated. Use AVSMEDLIST instead to display the medication list for a patient.   Note:  This document was prepared using Dragon voice recognition software and may include unintentional dictation errors.    Arta Silence, MD 06/25/17 2310

## 2017-06-26 LAB — BASIC METABOLIC PANEL
ANION GAP: 8 (ref 5–15)
BUN: 11 mg/dL (ref 6–20)
CALCIUM: 8.3 mg/dL — AB (ref 8.9–10.3)
CHLORIDE: 105 mmol/L (ref 101–111)
CO2: 30 mmol/L (ref 22–32)
Creatinine, Ser: 0.35 mg/dL — ABNORMAL LOW (ref 0.44–1.00)
GFR calc non Af Amer: >60 mL/min (ref >60)
Glucose, Bld: 82 mg/dL (ref 65–99)
POTASSIUM: 2.4 mmol/L — AB (ref 3.5–5.1)
Sodium: 143 mmol/L (ref 135–145)

## 2017-06-26 LAB — CBC
HCT: 37.7 % (ref 35.0–47.0)
HEMOGLOBIN: 12.7 g/dL (ref 12.0–16.0)
MCH: 28.6 pg (ref 26.0–34.0)
MCHC: 33.7 g/dL (ref 32.0–36.0)
MCV: 84.9 fL (ref 80.0–100.0)
PLATELETS: 165 10*3/uL (ref 150–440)
RBC: 4.44 MIL/uL (ref 3.80–5.20)
RDW: 14.9 % — ABNORMAL HIGH (ref 11.5–14.5)
WBC: 6.6 10*3/uL (ref 3.6–11.0)

## 2017-06-26 LAB — POTASSIUM: Potassium: 3.4 mmol/L — ABNORMAL LOW (ref 3.5–5.1)

## 2017-06-26 LAB — GASTROINTESTINAL PANEL BY PCR, STOOL (REPLACES STOOL CULTURE)

## 2017-06-26 LAB — C DIFFICILE QUICK SCREEN W PCR REFLEX
C DIFFICILE (CDIFF) TOXIN: NEGATIVE
C DIFFICLE (CDIFF) ANTIGEN: POSITIVE — AB

## 2017-06-26 LAB — MAGNESIUM: Magnesium: 1.4 mg/dL — ABNORMAL LOW (ref 1.7–2.4)

## 2017-06-26 LAB — CLOSTRIDIUM DIFFICILE BY PCR, REFLEXED: CDIFFPCR: NEGATIVE

## 2017-06-26 MED ORDER — METRONIDAZOLE 500 MG PO TABS
500.0000 mg | ORAL_TABLET | Freq: Three times a day (TID) | ORAL | Status: DC
  Administered 2017-06-26 – 2017-06-27 (×3): 500 mg via ORAL
  Filled 2017-06-26 (×5): qty 1

## 2017-06-26 MED ORDER — ROPINIROLE HCL 0.25 MG PO TABS
0.2500 mg | ORAL_TABLET | Freq: Three times a day (TID) | ORAL | Status: DC
  Administered 2017-06-26 – 2017-06-27 (×2): 0.25 mg via ORAL
  Filled 2017-06-26 (×4): qty 1

## 2017-06-26 MED ORDER — POTASSIUM CHLORIDE 20 MEQ PO PACK
40.0000 meq | PACK | Freq: Once | ORAL | Status: AC
  Administered 2017-06-26: 40 meq via ORAL
  Filled 2017-06-26: qty 2

## 2017-06-26 MED ORDER — POLYETHYLENE GLYCOL 3350 17 G PO PACK: 17.0000 g | PACK | Freq: Every day | ORAL | Status: DC | PRN

## 2017-06-26 MED ORDER — MAGNESIUM OXIDE 400 (241.3 MG) MG PO TABS
400.0000 mg | ORAL_TABLET | Freq: Every day | ORAL | Status: DC
  Administered 2017-06-26 – 2017-06-27 (×2): 400 mg via ORAL
  Filled 2017-06-26 (×2): qty 1

## 2017-06-26 MED ORDER — DIPHENOXYLATE-ATROPINE 2.5-0.025 MG PO TABS: 2.0000 | ORAL_TABLET | Freq: Four times a day (QID) | ORAL | Status: DC | PRN

## 2017-06-26 MED ORDER — SODIUM CHLORIDE 0.9% FLUSH
3.0000 mL | Freq: Two times a day (BID) | INTRAVENOUS | Status: DC
  Administered 2017-06-26 (×2): 3 mL via INTRAVENOUS

## 2017-06-26 MED ORDER — ROSUVASTATIN CALCIUM 10 MG PO TABS
10.0000 mg | ORAL_TABLET | Freq: Every day | ORAL | Status: DC
  Administered 2017-06-26: 10 mg via ORAL
  Filled 2017-06-26: qty 1

## 2017-06-26 MED ORDER — POTASSIUM CHLORIDE 20 MEQ PO PACK
40.0000 meq | PACK | Freq: Every day | ORAL | Status: DC
  Administered 2017-06-26 – 2017-06-27 (×2): 40 meq via ORAL
  Filled 2017-06-26 (×2): qty 2

## 2017-06-26 MED ORDER — POTASSIUM CHLORIDE IN NACL 40-0.9 MEQ/L-% IV SOLN
INTRAVENOUS | Status: DC
  Administered 2017-06-26 – 2017-06-27 (×3): 75 mL/h via INTRAVENOUS
  Filled 2017-06-26 (×5): qty 1000

## 2017-06-26 MED ORDER — CITALOPRAM HYDROBROMIDE 20 MG PO TABS
20.0000 mg | ORAL_TABLET | Freq: Every day | ORAL | Status: DC
  Administered 2017-06-27: 20 mg via ORAL
  Filled 2017-06-26 (×2): qty 1

## 2017-06-26 MED ORDER — RISAQUAD PO CAPS
1.0000 | ORAL_CAPSULE | Freq: Every day | ORAL | Status: DC
  Administered 2017-06-26 – 2017-06-27 (×2): 1 via ORAL
  Filled 2017-06-26 (×2): qty 1

## 2017-06-26 MED ORDER — OXCARBAZEPINE 150 MG PO TABS
150.0000 mg | ORAL_TABLET | Freq: Two times a day (BID) | ORAL | Status: DC
  Administered 2017-06-26 (×3): 150 mg via ORAL
  Filled 2017-06-26 (×4): qty 1

## 2017-06-26 MED ORDER — ONDANSETRON HCL 4 MG PO TABS: 4.0000 mg | ORAL_TABLET | Freq: Four times a day (QID) | ORAL | Status: DC | PRN

## 2017-06-26 MED ORDER — ENOXAPARIN SODIUM 40 MG/0.4ML ~~LOC~~ SOLN
40.0000 mg | SUBCUTANEOUS | Status: DC
  Administered 2017-06-26 (×2): 40 mg via SUBCUTANEOUS
  Filled 2017-06-26 (×2): qty 0.4

## 2017-06-26 MED ORDER — DIVALPROEX SODIUM 250 MG PO DR TAB
250.0000 mg | DELAYED_RELEASE_TABLET | Freq: Two times a day (BID) | ORAL | Status: DC
  Administered 2017-06-26 – 2017-06-27 (×4): 250 mg via ORAL
  Filled 2017-06-26 (×4): qty 1

## 2017-06-26 MED ORDER — ACETAMINOPHEN 325 MG PO TABS
650.0000 mg | ORAL_TABLET | Freq: Four times a day (QID) | ORAL | Status: DC | PRN
  Administered 2017-06-26 – 2017-06-27 (×2): 650 mg via ORAL
  Filled 2017-06-26 (×2): qty 2

## 2017-06-26 MED ORDER — PANTOPRAZOLE SODIUM 40 MG PO TBEC
40.0000 mg | DELAYED_RELEASE_TABLET | Freq: Every day | ORAL | Status: DC
  Administered 2017-06-26 – 2017-06-27 (×2): 40 mg via ORAL
  Filled 2017-06-26 (×2): qty 1

## 2017-06-26 MED ORDER — LACOSAMIDE 50 MG PO TABS
200.0000 mg | ORAL_TABLET | Freq: Two times a day (BID) | ORAL | Status: DC
  Administered 2017-06-26 – 2017-06-27 (×3): 200 mg via ORAL
  Filled 2017-06-26 (×3): qty 4

## 2017-06-26 MED ORDER — TRAZODONE HCL 50 MG PO TABS: 50.0000 mg | ORAL_TABLET | Freq: Every evening | ORAL | Status: DC | PRN

## 2017-06-26 MED ORDER — CLOPIDOGREL BISULFATE 75 MG PO TABS
75.0000 mg | ORAL_TABLET | Freq: Every day | ORAL | Status: DC
  Administered 2017-06-26 – 2017-06-27 (×2): 75 mg via ORAL
  Filled 2017-06-26 (×2): qty 1

## 2017-06-26 MED ORDER — SODIUM CHLORIDE 0.9% FLUSH
3.0000 mL | INTRAVENOUS | Status: DC | PRN
  Administered 2017-06-27: 3 mL via INTRAVENOUS
  Filled 2017-06-26: qty 3

## 2017-06-26 MED ORDER — MOMETASONE FURO-FORMOTEROL FUM 200-5 MCG/ACT IN AERO
2.0000 | INHALATION_SPRAY | Freq: Two times a day (BID) | RESPIRATORY_TRACT | Status: DC
  Administered 2017-06-26: 2 via RESPIRATORY_TRACT
  Filled 2017-06-26: qty 8.8

## 2017-06-26 MED ORDER — ONDANSETRON HCL 4 MG/2ML IJ SOLN
4.0000 mg | Freq: Four times a day (QID) | INTRAMUSCULAR | Status: DC | PRN
  Administered 2017-06-27: 4 mg via INTRAVENOUS
  Filled 2017-06-26: qty 2

## 2017-06-26 MED ORDER — MUPIROCIN 2 % EX OINT
1.0000 "application " | TOPICAL_OINTMENT | Freq: Two times a day (BID) | CUTANEOUS | Status: DC
  Administered 2017-06-26 – 2017-06-27 (×3): 1 via NASAL
  Filled 2017-06-26: qty 22

## 2017-06-26 MED ORDER — GLIPIZIDE ER 5 MG PO TB24
5.0000 mg | ORAL_TABLET | Freq: Every day | ORAL | Status: DC
  Administered 2017-06-26: 5 mg via ORAL
  Filled 2017-06-26 (×2): qty 1

## 2017-06-26 MED ORDER — DILTIAZEM HCL ER 180 MG PO CP24
180.0000 mg | ORAL_CAPSULE | Freq: Every day | ORAL | Status: DC
  Administered 2017-06-26: 180 mg via ORAL
  Filled 2017-06-26 (×3): qty 1

## 2017-06-26 MED ORDER — ACETAMINOPHEN 650 MG RE SUPP: 650.0000 mg | Freq: Four times a day (QID) | RECTAL | Status: DC | PRN

## 2017-06-26 MED ORDER — MONTELUKAST SODIUM 10 MG PO TABS
10.0000 mg | ORAL_TABLET | Freq: Every day | ORAL | Status: DC
  Administered 2017-06-26: 10 mg via ORAL
  Filled 2017-06-26: qty 1

## 2017-06-26 MED ORDER — CHLORHEXIDINE GLUCONATE CLOTH 2 % EX PADS
6.0000 | MEDICATED_PAD | Freq: Every day | CUTANEOUS | Status: DC
  Administered 2017-06-27: 6 via TOPICAL

## 2017-06-26 MED ORDER — ENSURE ENLIVE PO LIQD: 237.0000 mL | Freq: Two times a day (BID) | ORAL | Status: DC

## 2017-06-26 MED ORDER — ALBUTEROL SULFATE (2.5 MG/3ML) 0.083% IN NEBU: 2.5000 mg | INHALATION_SOLUTION | RESPIRATORY_TRACT | Status: DC | PRN

## 2017-06-26 MED ORDER — VANCOMYCIN 50 MG/ML ORAL SOLUTION
125.0000 mg | Freq: Four times a day (QID) | ORAL | Status: DC
  Filled 2017-06-26 (×2): qty 2.5

## 2017-06-26 MED ORDER — MAGNESIUM SULFATE 2 GM/50ML IV SOLN
2.0000 g | Freq: Once | INTRAVENOUS | Status: AC
  Administered 2017-06-26: 2 g via INTRAVENOUS
  Filled 2017-06-26: qty 50

## 2017-06-26 MED ORDER — FUROSEMIDE 20 MG PO TABS
20.0000 mg | ORAL_TABLET | Freq: Every day | ORAL | Status: DC
  Filled 2017-06-26 (×2): qty 1

## 2017-06-26 MED ORDER — SODIUM CHLORIDE 0.9 % IV SOLN: 250.0000 mL | INTRAVENOUS | Status: DC | PRN

## 2017-06-26 NOTE — ED Notes (Signed)
Pt up to toilet

## 2017-06-26 NOTE — Care Management (Signed)
Discussed if [facility] would be of benefit to prevent rehospitalization..  It is thought that increasing visits would not be of benefit because patient is being seen at a high frequency currently.  Discussed that patient ws sent for admission by PCP without knowledge of agency.  Patient is not eligible for [facility] because her current pcp does not participate

## 2017-06-26 NOTE — Care Management Important Message (Signed)
Important Message  Patient Details  Name: [name] MRN: [id number] Date of Birth: 30-Aug-1950   Medicare Important Message Given:  Yes Signed IM notice given    Katrina Stack, RN 06/26/2017, 4:31 PM

## 2017-06-26 NOTE — Progress Notes (Signed)
CRITICAL VALUE ALERT  Critical Value:  Potassium 2.4  Date & Time Notified:  06/26/17 3462  Provider Notified: Estanislado Pandy, MD  Orders Received/Actions taken: Start NS w/ 40 mEq K+ continuous infusion at 75 mL/hr and give 40 mEq PO potassium now.   Earleen Reaper, RN

## 2017-06-26 NOTE — Plan of Care (Signed)
Pt is A&Ox4. VSS. RA . IVF infusing per order. No complaints thus far. Will continue to monitor and report to oncoming Rn.  Progressing Education: Knowledge of General Education information will improve 06/26/2017 1231 - Progressing by Aleen Campi, RN Health Behavior/Discharge Planning: Ability to manage health-related needs will improve 06/26/2017 1231 - Progressing by Aleen Campi, RN Clinical Measurements: Ability to maintain clinical measurements within normal limits will improve 06/26/2017 1231 - Progressing by Aleen Campi, RN Will remain free from infection 06/26/2017 1231 - Progressing by Aleen Campi, RN Diagnostic test results will improve 06/26/2017 1231 - Progressing by Aleen Campi, RN Respiratory complications will improve 06/26/2017 1231 - Progressing by Aleen Campi, RN Cardiovascular complication will be avoided 06/26/2017 1231 - Progressing by Aleen Campi, RN Activity: Risk for activity intolerance will decrease 06/26/2017 1231 - Progressing by Aleen Campi, RN Nutrition: Adequate nutrition will be maintained 06/26/2017 1231 - Progressing by Aleen Campi, RN Coping: Level of anxiety will decrease 06/26/2017 1231 - Progressing by Aleen Campi, RN Elimination: Will not experience complications related to bowel motility 06/26/2017 1231 - Progressing by Aleen Campi, RN Will not experience complications related to urinary retention 06/26/2017 1231 - Progressing by Aleen Campi, RN Pain Managment: General experience of comfort will improve 06/26/2017 1231 - Progressing by Aleen Campi, RN Safety: Ability to remain free from injury will improve 06/26/2017 1231 - Progressing by Aleen Campi, RN Skin Integrity: Risk for impaired skin integrity will decrease 06/26/2017 1231 - Progressing by Aleen Campi, RN

## 2017-06-26 NOTE — Care Management Note (Signed)
Case Management Note  Patient Details  Name: [name] MRN: [id number] Date of Birth: 1950/08/10  Subjective/Objective:                 Recent discharge from [place]. Reaching out to [facility] to determine if currently receiving home health services.  Chronic home oxygen through [facility]. Lives alone and has support of her son. has wheelchair and walker.  No issues accessing medical care. Current wit her PCP. Sent to the ED by PCP due to low potassium found on labs.  Action/Plan:  Found is receiving home health SN and PT with [facility]. Notified of admission  Expected Discharge Date:                  Expected Discharge Plan:     In-House Referral:     Discharge planning Services     Post Acute Care Choice:    Choice offered to:     DME Arranged:    DME Agency:     HH Arranged:    HH Agency:     Status of Service:     If discussed at [employer], dates discussed:    Additional Comments:  Katrina Stack, RN 06/26/2017, 8:45 AM

## 2017-06-26 NOTE — Consult Note (Addendum)
   [facility] CM Inpatient Consult   06/26/2017  MAHEALANI SULAK [birth year] [id number]    [facility] Care Management referral received.   Spoke with inpatient RNCM to discuss referral and to confirm Mrs. Linford is hearing impaired.   Attempted to contact patient's son via phone without success.  Was able to speak with Mrs. Marshburn's daughter, Penny Pia at [phone]. Kenney Houseman confirms that Mrs. Drennen still goes to [facility] as Primary Care. She understands that this practice is not a [facility] provider. Of note, [facility] Care Management was recently involved but had to close case due to member changing to a non [facility] provider.   Made inpatient RNCM aware that [place] Management unable to follow at this time due to Mrs. Nicole Kindred having a non-[facility] Provider. This was confirmed with  Daughter, Penny Pia,  since Mrs. Tison is hearing impaired.   Of note, inpatient RNCM indicates patient will have continued services with [facility].   Marthenia Rolling, MSN-Ed, RN,BSN [facility] Liaison [phone]

## 2017-06-26 NOTE — Progress Notes (Signed)
Patient arrived to 2A Room 236. Patient denies pain and all questions answered. Patient oriented to unit and use of call bell. Skin assessment completed with Crystal RN and skin intact. A&Ox 4, VSS, and SB on verified tele-box #40-06. Patient is deaf, but able to read lips and sign. Nursing staff will continue to monitor for any changes in patient status. Karen Reaper, RN

## 2017-06-26 NOTE — Progress Notes (Signed)
[facility] [place] NAME: Tamera Pingley    MR#:  [id number]  DATE OF BIRTH:  [birth year]  SUBJECTIVE:  CHIEF COMPLAINT:   Chief Complaint  Patient presents with  . Abnormal Lab   -Patient with known history of seizures, who was recently in the hospital last 2 weeks for seizures was sent in from PCP office with hyperkalemia -Complains of significant diarrhea in the last week.  REVIEW OF SYSTEMS:  Review of Systems  Constitutional: Positive for malaise/fatigue. Negative for chills and fever.  HENT: Positive for hearing loss.   Eyes: Negative for blurred vision and double vision.  Respiratory: Negative for cough, shortness of breath and wheezing.   Cardiovascular: Negative for chest pain, palpitations and leg swelling.  Gastrointestinal: Positive for diarrhea. Negative for abdominal pain, constipation, nausea and vomiting.  Genitourinary: Negative for dysuria.  Musculoskeletal: Negative for myalgias.  Neurological: Negative for dizziness, speech change, focal weakness, seizures and headaches.  Psychiatric/Behavioral: Negative for depression.    DRUG ALLERGIES:   Allergies  Allergen Reactions  . Aspirin Itching  . Celebrex [Celecoxib] Itching    itching  . Ciprofloxacin Itching  . Codeine Itching  . Fosphenytoin Itching  . Levaquin [Levofloxacin In D5w] Itching  . Levofloxacin Itching  . Lovastatin Itching  . Penicillins     Documentation indicates severe reaction  Pt tolerated cephalosporin without adverse reaction 09/18   . Pravastatin Itching  . Sulfa Antibiotics Itching    VITALS:  Blood pressure (!) 178/49, pulse (!) 54, temperature 98.3 F (36.8 C), temperature source Oral, resp. rate 18, height 5' 3" (1.6 m), weight 61.1 kg (134 lb 12.8 oz), SpO2 92 %.  PHYSICAL EXAMINATION:  Physical Exam  GENERAL:  66 y.o.-year-old patient lying in the bed with no acute distress. Aphasic EYES: Pupils equal, round, reactive  to light and accommodation. No scleral icterus. Extraocular muscles intact.  HEENT: Head atraumatic, normocephalic. Oropharynx and nasopharynx clear.  NECK:  Supple, no jugular venous distention. No thyroid enlargement, no tenderness.  LUNGS: Normal breath sounds bilaterally, no wheezing, rales,rhonchi or crepitation. No use of accessory muscles of respiration.  CARDIOVASCULAR: S1, S2 normal. No murmurs, rubs, or gallops.  ABDOMEN: Soft, nontender, nondistended. Bowel sounds present. No organomegaly or mass.  EXTREMITIES: No pedal edema, cyanosis, or clubbing.  NEUROLOGIC: Cranial nerves II through XII are intact. Aphasic. Muscle strength 5/5 in all extremities. Sensation intact. Gait not checked.  PSYCHIATRIC: The patient is alert and oriented x 3.  SKIN: No obvious rash, lesion, or ulcer.    LABORATORY PANEL:   CBC Recent Labs  Lab 06/26/17 0346  WBC 6.6  HGB 12.7  HCT 37.7  PLT 165   ------------------------------------------------------------------------------------------------------------------  Chemistries  Recent Labs  Lab 06/26/17 0346  NA 143  K 2.4*  CL 105  CO2 30  GLUCOSE 82  BUN 11  CREATININE 0.35*  CALCIUM 8.3*  MG 1.4*   ------------------------------------------------------------------------------------------------------------------  Cardiac Enzymes No results for input(s): TROPONINI in the last 168 hours. ------------------------------------------------------------------------------------------------------------------  RADIOLOGY:  No results found.  EKG:   Orders placed or performed during the hospital encounter of 06/25/17  . ED EKG  . ED EKG  . EKG 12-Lead  . EKG 12-Lead    ASSESSMENT AND PLAN:   66 year old female with known history of seizures, and nonalcoholic liver cirrhosis, diabetes mellitus, hypertension, restless leg syndrome presents to hospital secondary to potassium.  1. Hypokalemia-does not have history of any chronic  hypokalemia. Potassium was  noted to be low during her last admission. -After discharge complains of significant diarrhea. -Replaced potassium and magnesium. Recheck labs later today and tomorrow morning.  2. Diarrhea-denies any abdominal pain or nausea or vomiting. -Send stool studies for C. difficile and GI panel. -Contact isolation. -If negative for any infection, will use Imodium as needed.  3. Seizure disorder-known history of seizures. Recent admission 2 weeks ago, started on Trileptal, on Depakote and also Vimpat. -Has been stable. Continue current medications. Keppra has been discontinued  4. Depression-continue Celexa  5. History of SVT-continue oral Cardizem  6. DVT prophylaxis-Lovenox  Encourage ambulation     All the records are reviewed and case discussed with Care Management/Social Workerr. Management plans discussed with the patient, family and they are in agreement.  CODE STATUS: Full Code  TOTAL TIME TAKING CARE OF THIS PATIENT: 38 minutes.   POSSIBLE D/C in 1-2 DAYS, DEPENDING ON CLINICAL CONDITION.   Gladstone Lighter M.D on 06/26/2017 at 8:58 AM  Between 7am to 6pm - Pager - [phone]  After 6pm go to [email or web address] - password [email or web address] [place] [facility]  Office  [phone]  CC: Primary care physician; Center, [facility]

## 2017-06-26 NOTE — Progress Notes (Signed)
Inpatient Diabetes Program Recommendations  AACE/ADA: New Consensus Statement on Inpatient Glycemic Control (2015)  Target Ranges:  Prepandial:   less than 140 mg/dL      Peak postprandial:   less than 180 mg/dL (1-2 hours)      Critically ill patients:  140 - 180 mg/dL   Lab Results  Component Value Date   GLUCAP 192 (H) 06/17/2017   HGBA1C 7.4 (H) 05/05/2017    Review of Glycemic Control  Results for MEGHIN, THIVIERGE (MRN [id number]) as of 06/26/2017 13:04  Ref. Range 06/25/2017 22:01 06/26/2017 03:46  Glucose Latest Ref Range: 65 - 99 mg/dL 206 (H) 82   Diabetes history: DM2 Outpatient Diabetes medications: Glipizide XL 5 mg daily  Current orders for Inpatient glycemic control: Glucotrol XL 36m qam   Inpatient Diabetes Program Recommendations:  Please consider d/c Glucotrol while inpatient and start Novolog 0-9 units TID with meals and Novolog 0-5 units QHS. High risk for hypoglycemia while in hospital.  JGentry Fitz RN, BA, MHA, CDE Diabetes Coordinator Inpatient Diabetes Program  [phone](Team Pager) [phone]([place] 06/26/2017 1:06 PM

## 2017-06-27 LAB — BASIC METABOLIC PANEL
ANION GAP: 6 (ref 5–15)
BUN: 11 mg/dL (ref 6–20)
CHLORIDE: 111 mmol/L (ref 101–111)
CO2: 26 mmol/L (ref 22–32)
Calcium: 8.1 mg/dL — ABNORMAL LOW (ref 8.9–10.3)
Creatinine, Ser: 0.48 mg/dL (ref 0.44–1.00)
Glucose, Bld: 115 mg/dL — ABNORMAL HIGH (ref 65–99)
POTASSIUM: 3.9 mmol/L (ref 3.5–5.1)
SODIUM: 143 mmol/L (ref 135–145)

## 2017-06-27 LAB — MAGNESIUM: MAGNESIUM: 1.7 mg/dL (ref 1.7–2.4)

## 2017-06-27 MED ORDER — FUROSEMIDE 40 MG PO TABS: 20.0000 mg | ORAL_TABLET | Freq: Every day | ORAL | 0 refills | Status: DC

## 2017-06-27 MED ORDER — DIPHENOXYLATE-ATROPINE 2.5-0.025 MG PO TABS: 2.0000 | ORAL_TABLET | Freq: Four times a day (QID) | ORAL | 0 refills | Status: DC | PRN

## 2017-06-27 MED ORDER — RISAQUAD PO CAPS: 1.0000 | ORAL_CAPSULE | Freq: Every day | ORAL | 0 refills | Status: DC

## 2017-06-27 MED ORDER — MAGNESIUM SULFATE 2 GM/50ML IV SOLN
2.0000 g | Freq: Once | INTRAVENOUS | Status: AC
  Administered 2017-06-27: 2 g via INTRAVENOUS
  Filled 2017-06-27: qty 50

## 2017-06-27 MED ORDER — POTASSIUM CHLORIDE 20 MEQ PO PACK: 20.0000 meq | PACK | Freq: Every day | ORAL | 0 refills | Status: DC

## 2017-06-27 NOTE — Care Management Note (Signed)
Case Management Note  Patient Details  Name: [name] MRN: [id number] Date of Birth: 12-30-1950  Subjective/Objective:      Call to [facility] per resumption of HH=PT, RN orders per discharge home today.               Action/Plan:   Expected Discharge Date:  06/27/17               Expected Discharge Plan:  [place]  In-House Referral:     Discharge planning Services  CM Consult  Post Acute Care Choice:  NA Choice offered to:  NA  DME Arranged:    DME Agency:     HH Arranged:  RN, PT [place] Agency:  [facility](resume services at discharge)  Status of Service:     If discussed at [place] of Stay Meetings, dates discussed:    Additional Comments:  Kiran Lapine A, RN 06/27/2017, 10:21 AM

## 2017-06-27 NOTE — Progress Notes (Signed)
Pt to be discharged today. Iv and tele removed. disch instructions and prescrips given to pt to her understanding. disch via w.c. Accompanied by her daughter.

## 2017-06-27 NOTE — Discharge Summary (Signed)
[facility] NAME: [name]    MR#:  [id number]  DATE OF BIRTH:  10-08-1950  DATE OF ADMISSION:  06/25/2017   ADMITTING PHYSICIAN: Gorden Harms, MD  DATE OF DISCHARGE: 06/27/17  PRIMARY CARE PHYSICIAN: [facility]   ADMISSION DIAGNOSIS:   Hypokalemia [E87.6]  DISCHARGE DIAGNOSIS:   Active Problems:   Hypokalemia   SECONDARY DIAGNOSIS:   Past Medical History:  Diagnosis Date  . Asthma   . Cirrhosis, non-alcoholic ([place])   . COPD (chronic obstructive pulmonary disease) ([place])   . Deaf   . Depression   . Diabetes mellitus without complication ([place])   . GERD (gastroesophageal reflux disease)   . Heart murmur   . Hepatitis   . Hypertension   . Lymph node disorder    arm  . Neuropathy   . On home oxygen therapy    hs  . Orthopnea   . RLS (restless legs syndrome)   . Seizures ([place])   . Shortness of breath dyspnea   . Sleep apnea   . Stroke [facility])    tia    HOSPITAL COURSE:   66 year old female with known history of seizures, and nonalcoholic liver cirrhosis, diabetes mellitus, hypertension, restless leg syndrome presents to hospital secondary to potassium.  1. Hypokalemia-does not have history of any chronic hypokalemia.  -Potassium is low due to her ongoing diarrhea and also she takes Lasix at home but unable to swallow to potassium supplements. -Diarrhea is improving, potassium improved after replacement. -Being changed to potassium powder daily while on Lasix and extra potassium when she has more than 3 loose stools per day. -Also had associated hypomagnesemia that has been replaced. Patient takes magnesium supplements at home.  2. Diarrhea-denies any abdominal pain or nausea or vomiting. -GI panel is negative. Stool for C. difficile antigen only positive but toxin and PCR are negative. -Stools are not loose and watery, white count is normal. So not being discharged on  antibiotics. -Encouraged probiotics. Lomotil as needed prescribed  3. Seizure disorder-known history of seizures. Recent admission 2 weeks ago, started on Trileptal, on Depakote and also Vimpat. -Has been stable. Continue current medications. Keppra has been discontinued last admission  4. Depression-continue Celexa  5. History of SVT-continue oral Cardizem   Ambulating well. Plan explained to patient and her daughter at bedside and they are in agreement    DISCHARGE CONDITIONS:   Guarded  CONSULTS OBTAINED:   None  DRUG ALLERGIES:   Allergies  Allergen Reactions  . Aspirin Itching  . Celebrex [Celecoxib] Itching    itching  . Ciprofloxacin Itching  . Codeine Itching  . Fosphenytoin Itching  . Levaquin [Levofloxacin In D5w] Itching  . Levofloxacin Itching  . Lovastatin Itching  . Penicillins     Documentation indicates severe reaction  Pt tolerated cephalosporin without adverse reaction 09/18   . Pravastatin Itching  . Sulfa Antibiotics Itching   DISCHARGE MEDICATIONS:   Allergies as of 06/27/2017      Reactions   Aspirin Itching   Celebrex [celecoxib] Itching   itching   Ciprofloxacin Itching   Codeine Itching   Fosphenytoin Itching   Levaquin [levofloxacin In D5w] Itching   Levofloxacin Itching   Lovastatin Itching   Penicillins    Documentation indicates severe reaction Pt tolerated cephalosporin without adverse reaction 09/18   Pravastatin Itching   Sulfa Antibiotics Itching      Medication List    TAKE these  medications   acidophilus Caps capsule Take 1 capsule by mouth daily.   albuterol 108 (90 Base) MCG/ACT inhaler Commonly known as:  PROVENTIL HFA;VENTOLIN HFA Inhale 2 puffs into the lungs every 4 (four) hours as needed for wheezing or shortness of breath.   albuterol (2.5 MG/3ML) 0.083% nebulizer solution Commonly known as:  PROVENTIL Take 3 mLs (2.5 mg total) by nebulization every 4 (four) hours as needed for wheezing or  shortness of breath.   citalopram 20 MG tablet Commonly known as:  CELEXA Take 1 tablet (20 mg total) by mouth daily.   clopidogrel 75 MG tablet Commonly known as:  PLAVIX Take 1 tablet (75 mg total) by mouth daily.   diltiazem 180 MG 24 hr capsule Commonly known as:  DILACOR XR Take 180 mg by mouth daily.   diphenoxylate-atropine 2.5-0.025 MG tablet Commonly known as:  LOMOTIL Take 2 tablets by mouth 4 (four) times daily as needed for diarrhea or loose stools.   divalproex 250 MG DR tablet Commonly known as:  DEPAKOTE Take 1 tablet (250 mg total) by mouth every 12 (twelve) hours.   feeding supplement (ENSURE ENLIVE) Liqd Take 237 mLs by mouth 2 (two) times daily between meals.   furosemide 40 MG tablet Commonly known as:  LASIX Take 0.5 tablets (20 mg total) by mouth daily.   GLIPIZIDE XL 5 MG 24 hr tablet Generic drug:  glipiZIDE Take 1 tablet (5 mg total) by mouth daily.   lacosamide 200 MG Tabs tablet Commonly known as:  VIMPAT Take 1 tablet (200 mg total) by mouth 2 (two) times daily.   magnesium oxide 400 (241.3 Mg) MG tablet Commonly known as:  MAG-OX Take 1 tablet (400 mg total) by mouth daily.   mometasone-formoterol 200-5 MCG/ACT Aero Commonly known as:  DULERA Inhale 2 puffs into the lungs 2 (two) times daily.   montelukast 10 MG tablet Commonly known as:  SINGULAIR Take 1 tablet by mouth at bedtime.   omeprazole 20 MG capsule Commonly known as:  PRILOSEC Take 20 mg by mouth daily.   OXcarbazepine 150 MG tablet Commonly known as:  TRILEPTAL Take 1 tablet (150 mg total) by mouth 2 (two) times daily.   potassium chloride 20 MEQ packet Commonly known as:  KLOR-CON Take 20 mEq by mouth daily. While taking lasix. Take an extra packet if having > 3 loose stools per day   rOPINIRole 0.25 MG tablet Commonly known as:  REQUIP Take 1 tablet (0.25 mg total) by mouth 3 (three) times daily.   rosuvastatin 10 MG tablet Commonly known as:  CRESTOR Take  10 mg by mouth daily.        DISCHARGE INSTRUCTIONS:   1. PCP follow-up in 1-2 weeks  DIET:   Cardiac diet and Diabetic diet  ACTIVITY:   Activity as tolerated  OXYGEN:   Home Oxygen: No.  Oxygen Delivery: room air  DISCHARGE LOCATION:   home   If you experience worsening of your admission symptoms, develop shortness of breath, life threatening emergency, suicidal or homicidal thoughts you must seek medical attention immediately by calling 911 or calling your MD immediately  if symptoms less severe.  You Must read complete instructions/literature along with all the possible adverse reactions/side effects for all the Medicines you take and that have been prescribed to you. Take any new Medicines after you have completely understood and accpet all the possible adverse reactions/side effects.   Please note  You were cared for by a hospitalist during your hospital  stay. If you have any questions about your discharge medications or the care you received while you were in the hospital after you are discharged, you can call the unit and asked to speak with the hospitalist on call if the hospitalist that took care of you is not available. Once you are discharged, your primary care physician will handle any further medical issues. Please note that NO REFILLS for any discharge medications will be authorized once you are discharged, as it is imperative that you return to your primary care physician (or establish a relationship with a primary care physician if you do not have one) for your aftercare needs so that they can reassess your need for medications and monitor your lab values.    On the day of Discharge:  VITAL SIGNS:   Blood pressure (!) 176/95, pulse 61, temperature 98.5 F (36.9 C), temperature source Oral, resp. rate 20, height _0  (1.6 m), weight 61.1 kg (134 lb 12.8 oz), SpO2 91 %.  PHYSICAL EXAMINATION:    GENERAL:  66 y.o.-year-old patient lying in the bed with no  acute distress. Aphasic and mouths words EYES: Pupils equal, round, reactive to light and accommodation. No scleral icterus. Extraocular muscles intact.  HEENT: Head atraumatic, normocephalic. Oropharynx and nasopharynx clear.  NECK:  Supple, no jugular venous distention. No thyroid enlargement, no tenderness.  LUNGS: Normal breath sounds bilaterally, no wheezing, rales,rhonchi or crepitation. No use of accessory muscles of respiration.  CARDIOVASCULAR: S1, S2 normal. No murmurs, rubs, or gallops.  ABDOMEN: Soft, nontender, nondistended. Bowel sounds present. No organomegaly or mass.  EXTREMITIES: No pedal edema, cyanosis, or clubbing.  NEUROLOGIC: Cranial nerves II through XII are intact. Aphasic. Muscle strength 5/5 in all extremities. Sensation intact. Gait not checked.  PSYCHIATRIC: The patient is alert and oriented x 3.  SKIN: No obvious rash, lesion, or ulcer    DATA REVIEW:   CBC Recent Labs  Lab 06/26/17 0346  WBC 6.6  HGB 12.7  HCT 37.7  PLT 165    Chemistries  Recent Labs  Lab 06/27/17 0553  NA 143  K 3.9  CL 111  CO2 26  GLUCOSE 115*  BUN 11  CREATININE 0.48  CALCIUM 8.1*  MG 1.7     Microbiology Results  Results for orders placed or performed during the hospital encounter of 06/25/17  Gastrointestinal Panel by PCR , Stool     Status: None   Collection Time: 06/26/17  9:31 AM  Result Value Ref Range Status   Campylobacter species NOT DETECTED NOT DETECTED Final   Plesimonas shigelloides NOT DETECTED NOT DETECTED Final   Salmonella species NOT DETECTED NOT DETECTED Final   Yersinia enterocolitica NOT DETECTED NOT DETECTED Final   Vibrio species NOT DETECTED NOT DETECTED Final   Vibrio cholerae NOT DETECTED NOT DETECTED Final   Enteroaggregative E coli (EAEC) NOT DETECTED NOT DETECTED Final   Enteropathogenic E coli (EPEC) NOT DETECTED NOT DETECTED Final   Enterotoxigenic E coli (ETEC) NOT DETECTED NOT DETECTED Final   Shiga like toxin producing E coli  (STEC) NOT DETECTED NOT DETECTED Final   Shigella/Enteroinvasive E coli (EIEC) NOT DETECTED NOT DETECTED Final   Cryptosporidium NOT DETECTED NOT DETECTED Final   Cyclospora cayetanensis NOT DETECTED NOT DETECTED Final   Entamoeba histolytica NOT DETECTED NOT DETECTED Final   Giardia lamblia NOT DETECTED NOT DETECTED Final   Adenovirus F40/41 NOT DETECTED NOT DETECTED Final   Astrovirus NOT DETECTED NOT DETECTED Final   Norovirus GI/GII NOT DETECTED NOT  DETECTED Final   Rotavirus A NOT DETECTED NOT DETECTED Final   Sapovirus (I, II, IV, and V) NOT DETECTED NOT DETECTED Final    Comment: Performed at [facility], [address] [place], [place] [address]  C difficile quick scan w PCR reflex     Status: Abnormal   Collection Time: 06/26/17  9:31 AM  Result Value Ref Range Status   C Diff antigen POSITIVE (A) NEGATIVE Final   C Diff toxin NEGATIVE NEGATIVE Final   C Diff interpretation Results are indeterminate. See PCR results.  Final    Comment: Performed at [facility], [address] [place], [place] [address]  C. Diff by PCR, Reflexed     Status: None   Collection Time: 06/26/17  9:31 AM  Result Value Ref Range Status   Toxigenic C. Difficile by PCR NEGATIVE NEGATIVE Final    Comment: Patient is colonized with non toxigenic C. difficile. May not need treatment unless significant symptoms are present. Performed at [facility], [address] [place], [place] [address]     RADIOLOGY:  No results found.   Management plans discussed with the patient, family and they are in agreement.  CODE STATUS:     Code Status Orders  (From admission, onward)        Start     Ordered   06/26/17 0035  Full code  Continuous     06/26/17 0034    Code Status History    Date Active Date Inactive Code Status Order ID Comments User Context   06/15/2017 20:15 06/17/2017 16:27 Full Code [id number]  Fritzi Mandes, MD Inpatient   06/15/2017 18:47 06/15/2017  20:15 DNR [id number]  Fritzi Mandes, MD ED   05/04/2017 08:35 05/07/2017 18:32 DNR [id number]  Max Sane, MD Inpatient   04/29/2017 19:15 05/04/2017 08:35 Full Code [id number]  Angela Adam, RN Inpatient   03/25/2017 02:35 03/30/2017 19:07 Full Code [id number]  Harvie Bridge, DO Inpatient   02/11/2017 21:31 02/12/2017 16:36 Full Code [id number]  Demetrios Loll, MD Inpatient   07/11/2016 11:31 07/17/2016 15:08 Partial Code [id number]  Marijo Conception, RN Inpatient   07/11/2016 02:37 07/11/2016 11:31 Full Code [id number]  Harvie Bridge, DO Inpatient   03/22/2016 22:44 03/24/2016 19:12 Partial Code [id number]  Toy Baker, MD Inpatient   03/29/2015 11:30 03/31/2015 14:21 Partial Code [id number]  Loletha Grayer, MD ED    Advance Directive Documentation     Most Recent Value  Type of Advance Directive  Healthcare Power of Attorney  Pre-existing out of facility DNR order (yellow form or pink MOST form)  No data  "MOST" Form in Place?  No data      TOTAL TIME TAKING CARE OF THIS PATIENT: 38 minutes.    Gladstone Lighter M.D on 06/27/2017 at 9:40 AM  Between 7am to 6pm - Pager - [phone]  After 6pm go to [email or web address] - [employer] [facility]  Office  [phone]  CC: Primary care physician; [facility], [facility]   Note: This dictation was prepared with Dragon dictation along with smaller phrase technology. Any transcriptional errors that result from this process are unintentional.

## 2017-06-27 NOTE — Progress Notes (Signed)
Pt. Slept well throughout the night with no c/o SOB. C/O nausea x1, medicated with effective results. C/o headache, medicated with effective results. HR ran SB in the mid to upper 50's over night. Will continue to monitor pt.

## 2017-06-27 NOTE — Progress Notes (Signed)
Pt c/o nausea RT will hold bipap at this time.Nurse informed

## 2017-07-04 DIAGNOSIS — M069 Rheumatoid arthritis, unspecified: Secondary | ICD-10-CM | POA: Diagnosis not present

## 2017-07-04 DIAGNOSIS — G40909 Epilepsy, unspecified, not intractable, without status epilepticus: Secondary | ICD-10-CM | POA: Diagnosis not present

## 2017-07-04 DIAGNOSIS — I10 Essential (primary) hypertension: Secondary | ICD-10-CM | POA: Diagnosis not present

## 2017-07-04 DIAGNOSIS — M150 Primary generalized (osteo)arthritis: Secondary | ICD-10-CM | POA: Diagnosis not present

## 2017-07-04 DIAGNOSIS — J449 Chronic obstructive pulmonary disease, unspecified: Secondary | ICD-10-CM | POA: Diagnosis not present

## 2017-07-04 DIAGNOSIS — E119 Type 2 diabetes mellitus without complications: Secondary | ICD-10-CM | POA: Diagnosis not present

## 2017-07-06 ENCOUNTER — Emergency Department: Payer: Medicare HMO

## 2017-07-06 ENCOUNTER — Emergency Department
Admission: EM | Admit: 2017-07-06 | Discharge: 2017-07-06 | Disposition: A | Discharge status: Home / Self Care | Payer: Medicare HMO | Attending: Emergency Medicine | Admitting: Emergency Medicine

## 2017-07-06 DIAGNOSIS — E119 Type 2 diabetes mellitus without complications: Secondary | ICD-10-CM | POA: Diagnosis not present

## 2017-07-06 DIAGNOSIS — E1140 Type 2 diabetes mellitus with diabetic neuropathy, unspecified: Secondary | ICD-10-CM | POA: Diagnosis not present

## 2017-07-06 DIAGNOSIS — S0292XA Unspecified fracture of facial bones, initial encounter for closed fracture: Secondary | ICD-10-CM | POA: Insufficient documentation

## 2017-07-06 DIAGNOSIS — S99921A Unspecified injury of right foot, initial encounter: Secondary | ICD-10-CM | POA: Diagnosis not present

## 2017-07-06 DIAGNOSIS — Z7984 Long term (current) use of oral hypoglycemic drugs: Secondary | ICD-10-CM | POA: Diagnosis not present

## 2017-07-06 DIAGNOSIS — G40909 Epilepsy, unspecified, not intractable, without status epilepticus: Secondary | ICD-10-CM | POA: Diagnosis not present

## 2017-07-06 DIAGNOSIS — M79674 Pain in right toe(s): Secondary | ICD-10-CM | POA: Diagnosis not present

## 2017-07-06 DIAGNOSIS — W1830XA Fall on same level, unspecified, initial encounter: Secondary | ICD-10-CM | POA: Diagnosis not present

## 2017-07-06 DIAGNOSIS — Y999 Unspecified external cause status: Secondary | ICD-10-CM | POA: Insufficient documentation

## 2017-07-06 DIAGNOSIS — F17200 Nicotine dependence, unspecified, uncomplicated: Secondary | ICD-10-CM | POA: Diagnosis not present

## 2017-07-06 DIAGNOSIS — W19XXXA Unspecified fall, initial encounter: Secondary | ICD-10-CM

## 2017-07-06 DIAGNOSIS — S01511A Laceration without foreign body of lip, initial encounter: Secondary | ICD-10-CM | POA: Diagnosis not present

## 2017-07-06 DIAGNOSIS — Z79899 Other long term (current) drug therapy: Secondary | ICD-10-CM | POA: Diagnosis not present

## 2017-07-06 DIAGNOSIS — Y92009 Unspecified place in unspecified non-institutional (private) residence as the place of occurrence of the external cause: Secondary | ICD-10-CM | POA: Insufficient documentation

## 2017-07-06 DIAGNOSIS — Y939 Activity, unspecified: Secondary | ICD-10-CM | POA: Insufficient documentation

## 2017-07-06 DIAGNOSIS — R42 Dizziness and giddiness: Secondary | ICD-10-CM | POA: Diagnosis not present

## 2017-07-06 DIAGNOSIS — J449 Chronic obstructive pulmonary disease, unspecified: Secondary | ICD-10-CM | POA: Insufficient documentation

## 2017-07-06 DIAGNOSIS — M79671 Pain in right foot: Secondary | ICD-10-CM | POA: Insufficient documentation

## 2017-07-06 DIAGNOSIS — J45909 Unspecified asthma, uncomplicated: Secondary | ICD-10-CM | POA: Diagnosis not present

## 2017-07-06 DIAGNOSIS — E876 Hypokalemia: Secondary | ICD-10-CM | POA: Insufficient documentation

## 2017-07-06 DIAGNOSIS — I10 Essential (primary) hypertension: Secondary | ICD-10-CM | POA: Diagnosis not present

## 2017-07-06 DIAGNOSIS — S0990XA Unspecified injury of head, initial encounter: Secondary | ICD-10-CM | POA: Diagnosis not present

## 2017-07-06 DIAGNOSIS — M069 Rheumatoid arthritis, unspecified: Secondary | ICD-10-CM | POA: Diagnosis not present

## 2017-07-06 DIAGNOSIS — M150 Primary generalized (osteo)arthritis: Secondary | ICD-10-CM | POA: Diagnosis not present

## 2017-07-06 LAB — BASIC METABOLIC PANEL
ANION GAP: 9 (ref 5–15)
BUN: 5 mg/dL — ABNORMAL LOW (ref 6–20)
CHLORIDE: 100 mmol/L — AB (ref 101–111)
CO2: 29 mmol/L (ref 22–32)
Calcium: 8.7 mg/dL — ABNORMAL LOW (ref 8.9–10.3)
Creatinine, Ser: 0.43 mg/dL — ABNORMAL LOW (ref 0.44–1.00)
GFR calc Af Amer: >60 mL/min (ref >60)
GLUCOSE: 118 mg/dL — AB (ref 65–99)
POTASSIUM: 2.9 mmol/L — AB (ref 3.5–5.1)
Sodium: 138 mmol/L (ref 135–145)

## 2017-07-06 LAB — CBC
HEMATOCRIT: 45.3 % (ref 35.0–47.0)
HEMOGLOBIN: 15.4 g/dL (ref 12.0–16.0)
MCH: 28.7 pg (ref 26.0–34.0)
MCHC: 34.1 g/dL (ref 32.0–36.0)
MCV: 84.2 fL (ref 80.0–100.0)
Platelets: 241 10*3/uL (ref 150–440)
RBC: 5.37 MIL/uL — ABNORMAL HIGH (ref 3.80–5.20)
RDW: 14.5 % (ref 11.5–14.5)
WBC: 7.3 10*3/uL (ref 3.6–11.0)

## 2017-07-06 LAB — VALPROIC ACID LEVEL: Valproic Acid Lvl: 16 ug/mL — ABNORMAL LOW (ref 50.0–100.0)

## 2017-07-06 LAB — GLUCOSE, CAPILLARY: GLUCOSE-CAPILLARY: 115 mg/dL — AB (ref 65–99)

## 2017-07-06 MED ORDER — POTASSIUM CHLORIDE CRYS ER 20 MEQ PO TBCR
40.0000 meq | EXTENDED_RELEASE_TABLET | Freq: Once | ORAL | Status: DC
  Filled 2017-07-06: qty 2

## 2017-07-06 MED ORDER — VALPROATE SODIUM 500 MG/5ML IV SOLN: 500.0000 mg | Freq: Once | INTRAVENOUS | Status: DC

## 2017-07-06 MED ORDER — NAPROXEN 500 MG PO TABS
250.0000 mg | ORAL_TABLET | Freq: Once | ORAL | Status: AC
  Administered 2017-07-06: 250 mg via ORAL
  Filled 2017-07-06: qty 1

## 2017-07-06 MED ORDER — POTASSIUM CHLORIDE 20 MEQ/15ML (10%) PO SOLN
40.0000 meq | Freq: Once | ORAL | Status: AC
  Administered 2017-07-06: 40 meq via ORAL
  Filled 2017-07-06: qty 30

## 2017-07-06 MED ORDER — ONDANSETRON HCL 4 MG/2ML IJ SOLN
INTRAMUSCULAR | Status: AC
  Filled 2017-07-06: qty 2

## 2017-07-06 MED ORDER — LORAZEPAM 2 MG/ML IJ SOLN
INTRAMUSCULAR | Status: AC
  Filled 2017-07-06: qty 1

## 2017-07-06 MED ORDER — ONDANSETRON HCL 4 MG/2ML IJ SOLN: 4.0000 mg | Freq: Once | INTRAMUSCULAR | Status: DC

## 2017-07-06 NOTE — ED Triage Notes (Signed)
Pt is deaf, attempted to use ASL video interpreter and pt unable to answer. Daughter reports pt fell about 30 min PTA, reports pt got dizzy and fell. Pt with c/o lower lip pain and left big toe. Daughter reports pt with hx of seizures, reports pt is acting like she's going to have a seizure. Pt is very confused, unable to answer daughter using ASL.

## 2017-07-06 NOTE — ED Provider Notes (Signed)
[facility] Emergency Department Provider Note    ____________________________________________   I have reviewed the triage vital signs and the nursing notes.   HISTORY  Chief Complaint Fall  History limited by: Deaf, some history obtained from daughter   HPI [name] is a 66 y.o. female who presents to the emergency department today because of a fall. The patient was apparently at home and fell forwards. Was complaining primarily of face and right great toe pain. The patient did have a recent hospitalization for seizures (she does have a history of seizures). The patient was feeling weaker than normal yesterday. During triage here the patient went unresponsive. No seizure like activity was reported. The patient denies any recent illnesses.   Per medical record review patient has a history of copd, seizures.   Past Medical History:  Diagnosis Date  . Asthma   . Cirrhosis, non-alcoholic ([place])   . COPD (chronic obstructive pulmonary disease) ([place])   . Deaf   . Depression   . Diabetes mellitus without complication ([place])   . GERD (gastroesophageal reflux disease)   . Heart murmur   . Hepatitis   . Hypertension   . Lymph node disorder    arm  . Neuropathy   . On home oxygen therapy    hs  . Orthopnea   . RLS (restless legs syndrome)   . Seizures ([place])   . Shortness of breath dyspnea   . Sleep apnea   . Stroke [facility])    tia    Patient Active Problem List   Diagnosis Date Noted  . Seizures ([place]) 06/15/2017  . Goals of care, counseling/discussion 05/06/2017  . Status epilepticus ([place])   . HCAP (healthcare-associated pneumonia)   . Community acquired pneumonia of left lower lobe of lung ([place])   . Hypoxia   . Acute on chronic respiratory failure with hypoxia ([place]) 03/24/2017  . Hypokalemia 02/12/2017  . Sepsis ([place]) 02/11/2017  . GERD (gastroesophageal reflux disease) 03/23/2016  . Depression 03/23/2016  . Convulsions/seizures ([place])  03/22/2016  . DM (diabetes mellitus), type 2 ([place]) 03/22/2016  . Essential hypertension 03/22/2016  . Neuropathy 03/22/2016  . Convulsions ([place]) 03/22/2016  . COPD exacerbation ([place]) 03/31/2015  . Acute respiratory failure with hypoxia ([place]) 03/29/2015    Past Surgical History:  Procedure Laterality Date  . CATARACT EXTRACTION W/PHACO Right 11/23/2014   Procedure: CATARACT EXTRACTION PHACO AND INTRAOCULAR LENS PLACEMENT (IOC);  Surgeon: Lyla Glassing, MD;  Location: [facility] ORS;  Service: Ophthalmology;  Laterality: Right;  . CATARACT EXTRACTION W/PHACO Left 12/14/2014   Procedure: CATARACT EXTRACTION PHACO AND INTRAOCULAR LENS PLACEMENT (IOC);  Surgeon: Lyla Glassing, MD;  Location: [facility] ORS;  Service: Ophthalmology;  Laterality: Left;  US:01:16.6 AP:15.8 CDE:12.14  . CESAREAN SECTION    . CHOLECYSTECTOMY    . KNEE ARTHROPLASTY    . THUMB ARTHROSCOPY    . TONSILLECTOMY    . TYMPANOPLASTY     muliple    Prior to Admission medications   Medication Sig Start Date End Date Taking? Authorizing Provider  acidophilus (RISAQUAD) CAPS capsule Take 1 capsule by mouth daily. 06/27/17   Gladstone Lighter, MD  albuterol (PROVENTIL HFA;VENTOLIN HFA) 108 (90 BASE) MCG/ACT inhaler Inhale 2 puffs into the lungs every 4 (four) hours as needed for wheezing or shortness of breath.    [provider]  albuterol (PROVENTIL) (2.5 MG/3ML) 0.083% nebulizer solution Take 3 mLs (2.5 mg total) by nebulization every 4 (four) hours as needed for wheezing or shortness  of breath. 07/17/16   Vaughan Basta, MD  citalopram (CELEXA) 20 MG tablet Take 1 tablet (20 mg total) by mouth daily. 07/17/16   Vaughan Basta, MD  clopidogrel (PLAVIX) 75 MG tablet Take 1 tablet (75 mg total) by mouth daily. 07/17/16   Vaughan Basta, MD  diltiazem (DILACOR XR) 180 MG 24 hr capsule Take 180 mg by mouth daily.    [provider]  diphenoxylate-atropine (LOMOTIL) 2.5-0.025 MG tablet Take 2  tablets by mouth 4 (four) times daily as needed for diarrhea or loose stools. 06/27/17   Gladstone Lighter, MD  divalproex (DEPAKOTE) 250 MG DR tablet Take 1 tablet (250 mg total) by mouth every 12 (twelve) hours. 06/17/17   Vaughan Basta, MD  feeding supplement, ENSURE ENLIVE, (ENSURE ENLIVE) LIQD Take 237 mLs by mouth 2 (two) times daily between meals. 06/17/17   Vaughan Basta, MD  furosemide (LASIX) 40 MG tablet Take 0.5 tablets (20 mg total) by mouth daily. 06/27/17   Gladstone Lighter, MD  GLIPIZIDE XL 5 MG 24 hr tablet Take 1 tablet (5 mg total) by mouth daily. 05/07/17   Loletha Grayer, MD  lacosamide (VIMPAT) 200 MG TABS tablet Take 1 tablet (200 mg total) by mouth 2 (two) times daily. 05/07/17   Loletha Grayer, MD  magnesium oxide (MAG-OX) 400 (241.3 Mg) MG tablet Take 1 tablet (400 mg total) by mouth daily. 05/07/17   Loletha Grayer, MD  mometasone-formoterol (DULERA) 200-5 MCG/ACT AERO Inhale 2 puffs into the lungs 2 (two) times daily. 07/17/16   Vaughan Basta, MD  montelukast (SINGULAIR) 10 MG tablet Take 1 tablet by mouth at bedtime. 03/18/16   [provider]  omeprazole (PRILOSEC) 20 MG capsule Take 20 mg by mouth daily.    [provider]  OXcarbazepine (TRILEPTAL) 150 MG tablet Take 1 tablet (150 mg total) by mouth 2 (two) times daily. 06/17/17   Vaughan Basta, MD  potassium chloride (KLOR-CON) 20 MEQ packet Take 20 mEq by mouth daily. While taking lasix. Take an extra packet if having > 3 loose stools per day 06/27/17   Gladstone Lighter, MD  rOPINIRole (REQUIP) 0.25 MG tablet Take 1 tablet (0.25 mg total) by mouth 3 (three) times daily. 03/24/16   Oswald Hillock, MD  rosuvastatin (CRESTOR) 10 MG tablet Take 10 mg by mouth daily. 04/21/17   [provider]    Allergies Aspirin; Celebrex [celecoxib]; Ciprofloxacin; Codeine; Fosphenytoin; Levaquin [levofloxacin in d5w]; Levofloxacin; Lovastatin; Penicillins;  Pravastatin; and Sulfa antibiotics  Family History  Problem Relation Age of Onset  . Lung cancer Mother   . CAD Father     Social History Social History   Tobacco Use  . Smoking status: Current Every Day Smoker    Packs/day: 0.50  . Smokeless tobacco: Never Used  Substance Use Topics  . Alcohol use: No  . Drug use: No    Review of Systems Constitutional: No fever/chills Eyes: No visual changes. ENT: Positive for lower lip laceration. Cardiovascular: Denies chest pain. Respiratory: Denies shortness of breath. Gastrointestinal: No abdominal pain.  No nausea, no vomiting.  No diarrhea.   Genitourinary: Negative for dysuria. Musculoskeletal: Negative for back pain. Skin: Negative for rash. Neurological: Positive for headache.   ____________________________________________   PHYSICAL EXAM:  VITAL SIGNS: ED Triage Vitals [07/06/17 1529]  Enc Vitals Group     BP (!) 182/93     Pulse Rate 68     Resp 16     Temp 98.6 F (37 C)  Temp Source Oral     SpO2 95 %     Weight 135 lb (61.2 kg)     Height _0  (1.6 m)   Constitutional: Awake and alert. Slightly slow to respond.  Eyes: Conjunctivae are normal. EOMI.  ENT   Head: Normocephalic.   Nose: No congestion/rhinnorhea.   Mouth/Throat: Lower lip laceration, well approximated.    Neck: No stridor. Hematological/Lymphatic/Immunilogical: No cervical lymphadenopathy. Cardiovascular: Normal rate, regular rhythm.  No murmurs, rubs, or gallops.  Respiratory: Normal respiratory effort without tachypnea nor retractions. Breath sounds are clear and equal bilaterally. No wheezes/rales/rhonchi. Gastrointestinal: Soft and non tender. No rebound. No guarding.  Genitourinary: Deferred Musculoskeletal: Normal range of motion in all extremities. No lower extremity edema. Neurologic:  Deaf. Slightly slow to respond.  No gross focal neurologic deficits are appreciated.  Skin:  Skin is warm, dry and intact. No rash  noted. Psychiatric: Mood and affect are normal. Speech and behavior are normal. Patient exhibits appropriate insight and judgment.  ____________________________________________    LABS (pertinent positives/negatives)  Valproic acid level 16 CBC wbc 7.3 hgb 15.4, plt 241 BMP k 2.9, glu 118, cr 0.43  ____________________________________________   EKG  I, Nance Pear, attending physician, personally viewed and interpreted this EKG  EKG Time: 1539 Rate: 63 Rhythm: normal sinus rhythm Axis: normal Intervals: qtc 454 QRS: narrow ST changes: no st elevation, t wave inversion V1-V4 Impression: abnormal ekg   ____________________________________________    RADIOLOGY  Right foot No acute fracture  CT head No intracranial bleed. Lateral wall left maxillary sinus fracture.  ____________________________________________   PROCEDURES  Procedures  ____________________________________________   INITIAL IMPRESSION / ASSESSMENT AND PLAN / ED COURSE  Pertinent labs & imaging results that were available during my care of the patient were reviewed by me and considered in my medical decision making (see chart for details).  Patient presented to the emergency department today after a fall.  Patient does have history of seizures and did have an episode of unresponsiveness in triage.  She did seem somewhat post ictal afterwards.  During the course of her stay in the emergency department she did fairly quickly resume her baseline mental status.  Patient's CT scan of the head showed a possible maxillary sinus fracture.  I did discuss this with the patient.  She felt okay deferring a dedicated CT maxillofacial scan.  I think this is reasonable given that the patient has no concerning clinical findings.  Patient's Depakote level was low.  Patient states that they have been trying to wean her off.  She did not want a loading dose.  Potassium was also a little low, patient was given some  potassium here. No other concerning findings. Discussed the findings with the patient.   ____________________________________________   FINAL CLINICAL IMPRESSION(S) / ED DIAGNOSES  Final diagnoses:  Fall, initial encounter  Closed fracture of facial bone, unspecified facial bone, initial encounter ([place])  Right foot pain  Hypokalemia     Note: This dictation was prepared with Dragon dictation. Any transcriptional errors that result from this process are unintentional     Nance Pear, MD 07/06/17 2328

## 2017-07-06 NOTE — ED Notes (Signed)
AAOx3.  Patient c/o nausea.  SEe MAR.  No emesis observed.

## 2017-07-06 NOTE — Discharge Instructions (Signed)
Please seek medical attention for any high fevers, chest pain, shortness of breath, change in behavior, persistent vomiting, bloody stool or any other new or concerning symptoms.  

## 2017-07-06 NOTE — ED Notes (Signed)
AAOx3.  Skin warm and dry.  NAD

## 2017-07-07 DIAGNOSIS — J4520 Mild intermittent asthma, uncomplicated: Secondary | ICD-10-CM | POA: Diagnosis not present

## 2017-07-07 DIAGNOSIS — J449 Chronic obstructive pulmonary disease, unspecified: Secondary | ICD-10-CM | POA: Diagnosis not present

## 2017-07-07 DIAGNOSIS — I2723 Pulmonary hypertension due to lung diseases and hypoxia: Secondary | ICD-10-CM | POA: Diagnosis not present

## 2017-07-07 DIAGNOSIS — K210 Gastro-esophageal reflux disease with esophagitis: Secondary | ICD-10-CM | POA: Diagnosis not present

## 2017-07-07 DIAGNOSIS — I6990 Unspecified sequelae of unspecified cerebrovascular disease: Secondary | ICD-10-CM | POA: Diagnosis not present

## 2017-07-08 DIAGNOSIS — J449 Chronic obstructive pulmonary disease, unspecified: Secondary | ICD-10-CM | POA: Diagnosis not present

## 2017-07-08 DIAGNOSIS — G40909 Epilepsy, unspecified, not intractable, without status epilepticus: Secondary | ICD-10-CM | POA: Diagnosis not present

## 2017-07-08 DIAGNOSIS — M150 Primary generalized (osteo)arthritis: Secondary | ICD-10-CM | POA: Diagnosis not present

## 2017-07-08 DIAGNOSIS — E119 Type 2 diabetes mellitus without complications: Secondary | ICD-10-CM | POA: Diagnosis not present

## 2017-07-08 DIAGNOSIS — M069 Rheumatoid arthritis, unspecified: Secondary | ICD-10-CM | POA: Diagnosis not present

## 2017-07-08 DIAGNOSIS — I10 Essential (primary) hypertension: Secondary | ICD-10-CM | POA: Diagnosis not present

## 2017-07-09 DIAGNOSIS — M150 Primary generalized (osteo)arthritis: Secondary | ICD-10-CM | POA: Diagnosis not present

## 2017-07-09 DIAGNOSIS — J449 Chronic obstructive pulmonary disease, unspecified: Secondary | ICD-10-CM | POA: Diagnosis not present

## 2017-07-09 DIAGNOSIS — G40909 Epilepsy, unspecified, not intractable, without status epilepticus: Secondary | ICD-10-CM | POA: Diagnosis not present

## 2017-07-09 DIAGNOSIS — M069 Rheumatoid arthritis, unspecified: Secondary | ICD-10-CM | POA: Diagnosis not present

## 2017-07-09 DIAGNOSIS — I10 Essential (primary) hypertension: Secondary | ICD-10-CM | POA: Diagnosis not present

## 2017-07-09 DIAGNOSIS — E119 Type 2 diabetes mellitus without complications: Secondary | ICD-10-CM | POA: Diagnosis not present

## 2017-07-14 DIAGNOSIS — E878 Other disorders of electrolyte and fluid balance, not elsewhere classified: Secondary | ICD-10-CM | POA: Diagnosis not present

## 2017-07-14 DIAGNOSIS — R7309 Other abnormal glucose: Secondary | ICD-10-CM | POA: Diagnosis not present

## 2017-07-15 DIAGNOSIS — I10 Essential (primary) hypertension: Secondary | ICD-10-CM | POA: Diagnosis not present

## 2017-07-15 DIAGNOSIS — J449 Chronic obstructive pulmonary disease, unspecified: Secondary | ICD-10-CM | POA: Diagnosis not present

## 2017-07-15 DIAGNOSIS — M069 Rheumatoid arthritis, unspecified: Secondary | ICD-10-CM | POA: Diagnosis not present

## 2017-07-15 DIAGNOSIS — E119 Type 2 diabetes mellitus without complications: Secondary | ICD-10-CM | POA: Diagnosis not present

## 2017-07-15 DIAGNOSIS — G40909 Epilepsy, unspecified, not intractable, without status epilepticus: Secondary | ICD-10-CM | POA: Diagnosis not present

## 2017-07-15 DIAGNOSIS — M150 Primary generalized (osteo)arthritis: Secondary | ICD-10-CM | POA: Diagnosis not present

## 2017-07-16 DIAGNOSIS — E119 Type 2 diabetes mellitus without complications: Secondary | ICD-10-CM | POA: Diagnosis not present

## 2017-07-16 DIAGNOSIS — I10 Essential (primary) hypertension: Secondary | ICD-10-CM | POA: Diagnosis not present

## 2017-07-16 DIAGNOSIS — G40909 Epilepsy, unspecified, not intractable, without status epilepticus: Secondary | ICD-10-CM | POA: Diagnosis not present

## 2017-07-16 DIAGNOSIS — J449 Chronic obstructive pulmonary disease, unspecified: Secondary | ICD-10-CM | POA: Diagnosis not present

## 2017-07-16 DIAGNOSIS — M150 Primary generalized (osteo)arthritis: Secondary | ICD-10-CM | POA: Diagnosis not present

## 2017-07-16 DIAGNOSIS — M069 Rheumatoid arthritis, unspecified: Secondary | ICD-10-CM | POA: Diagnosis not present

## 2017-07-20 DIAGNOSIS — I10 Essential (primary) hypertension: Secondary | ICD-10-CM | POA: Diagnosis not present

## 2017-07-20 DIAGNOSIS — J449 Chronic obstructive pulmonary disease, unspecified: Secondary | ICD-10-CM | POA: Diagnosis not present

## 2017-07-20 DIAGNOSIS — G40909 Epilepsy, unspecified, not intractable, without status epilepticus: Secondary | ICD-10-CM | POA: Diagnosis not present

## 2017-07-20 DIAGNOSIS — E119 Type 2 diabetes mellitus without complications: Secondary | ICD-10-CM | POA: Diagnosis not present

## 2017-07-20 DIAGNOSIS — M150 Primary generalized (osteo)arthritis: Secondary | ICD-10-CM | POA: Diagnosis not present

## 2017-07-20 DIAGNOSIS — M069 Rheumatoid arthritis, unspecified: Secondary | ICD-10-CM | POA: Diagnosis not present

## 2017-08-07 DIAGNOSIS — K210 Gastro-esophageal reflux disease with esophagitis: Secondary | ICD-10-CM | POA: Diagnosis not present

## 2017-08-07 DIAGNOSIS — J449 Chronic obstructive pulmonary disease, unspecified: Secondary | ICD-10-CM | POA: Diagnosis not present

## 2017-08-07 DIAGNOSIS — I2723 Pulmonary hypertension due to lung diseases and hypoxia: Secondary | ICD-10-CM | POA: Diagnosis not present

## 2017-08-07 DIAGNOSIS — I6990 Unspecified sequelae of unspecified cerebrovascular disease: Secondary | ICD-10-CM | POA: Diagnosis not present

## 2017-08-07 DIAGNOSIS — J4520 Mild intermittent asthma, uncomplicated: Secondary | ICD-10-CM | POA: Diagnosis not present

## 2017-08-10 ENCOUNTER — Encounter: Payer: Self-pay | Admitting: Neurology

## 2017-08-10 ENCOUNTER — Ambulatory Visit: Payer: Medicare HMO | Admitting: Neurology

## 2017-08-10 ENCOUNTER — Other Ambulatory Visit: Payer: Self-pay

## 2017-08-10 VITALS — BP 152/80 | HR 61 | Ht 63.0 in | Wt 140.0 lb

## 2017-08-10 DIAGNOSIS — G40009 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 with seizures of localized onset, not intractable, without status epilepticus: Secondary | ICD-10-CM

## 2017-08-10 DIAGNOSIS — M542 Cervicalgia: Secondary | ICD-10-CM | POA: Diagnosis not present

## 2017-08-10 DIAGNOSIS — R296 Repeated falls: Secondary | ICD-10-CM

## 2017-08-10 DIAGNOSIS — R51 Headache: Secondary | ICD-10-CM

## 2017-08-10 DIAGNOSIS — R519 Headache, unspecified: Secondary | ICD-10-CM

## 2017-08-10 MED ORDER — NORTRIPTYLINE HCL 10 MG PO CAPS: 10.0000 mg | ORAL_CAPSULE | Freq: Every day | ORAL | 5 refills | Status: DC

## 2017-08-10 NOTE — Patient Instructions (Addendum)
1. Schedule MRI cervical spine with and without contrast  We have sent a referral to [facility] for your MRI and they will call you directly to schedule your appt. They are located at [place]. If you need to contact them directly please call [phone].   2. Schedule 48-hour EEG 3. Refer to [facility] for Balance therapy, use walker at all times 4. Start weaning off Depakote 24m: take 1 tablet at night for a week, then stop 5. Start nortriptyline 155m take 1 capsule at night 6. Reduce Excedrin intake to 2-3 a week, otherwise headaches will not get better 7. Continue Vimpat and Trileptal for now 8. Follow-up after EEG, call for any changes  Seizure Precautions: 1. If medication has been prescribed for you to prevent seizures, take it exactly as directed.  Do not stop taking the medicine without talking to your doctor first, even if you have not had a seizure in a long time.   2. Avoid activities in which a seizure would cause danger to yourself or to others.  Don't operate dangerous machinery, swim alone, or climb in high or dangerous places, such as on ladders, roofs, or girders.  Do not drive unless your doctor says you may.  3. If you have any warning that you may have a seizure, lay down in a safe place where you can't hurt yourself.    4.  No driving for 6 months from last seizure, [facility]  Please refer to the following link on the EpEmoryebsite for more information: http://[email or web address].org/[email or web address]/drivingu.cfm   5.  Maintain good sleep hygiene. Avoid alcohol.  6.  Contact your doctor if you have any problems that may be related to the medicine you are taking.  7.  Call 911 and bring the patient back to the ED if:        A.  The seizure lasts longer than 5 minutes.       B.  The patient doesn't awaken shortly after the seizure  C.  The patient has new problems such as difficulty  seeing, speaking or moving  D.  The patient was injured during the seizure  E.  The patient has a temperature over 102 F (39C)  F.  The patient vomited and now is having trouble breathing

## 2017-08-10 NOTE — Progress Notes (Signed)
NEUROLOGY CONSULTATION NOTE  [name] MRN: [id number] DOB: 25-Jul-1950  Referring provider: Dr. Alexis Goodell Primary care provider: [facility]  Reason for consult:  seizures  Dear Dr Doy Mince:  Thank you for your kind referral of [name] for consultation of the above symptoms. Although her history is well known to you, please allow me to reiterate it for the purpose of our medical record. The patient was accompanied to the clinic by her daughter who also provides collateral information. A deaf interpreter helps with sign language.  Records and images were personally reviewed where available.  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 She reports that she would have a warning where both legs would start moving, then she smells something such as chocolates or flowers, then have nausea. She has woken up on the floor with lip bite and urinary incontinence. Her daughter reports her seizures are always the same as when the daughter was a child, she would have head bobbing, unresponsive with little movements in her arms. Her last seizure was while she was in the ER on 07/06/17 after she had a fall at home. While in the ER, had a typical episode, no convulsive activity reported.   Extensive records from prior [facility] visits were reviewed prior to the visit. She was in the ER in 2013 for a GTC, and had 4 more in the ER. She was taking Keppra, Dilantin was added. She had an MRI brain with and without contrast, images unavailable for review, no acute changes noted. She was back in the hospital in 2014 for pneumonia, then noted to have 7 seizure-like episodes with her head moving side to side, eyes closed with nystagmus underneat, unresponsive then  legs stiffened with clonic movements. She had described the sweet smell at that time. Keppra dose was increased (not on Dilantin). She was transferred to [facility] from [facility] in 03/2016 after she had seizures and remained unresponsive. She had a witnessed event described as "upper gaze defect with multiple small fasciculations in arms as well as nystagmus in eyes." Concern for non-epileptic events was raised. She had an EEG which was within normal limits, no epileptiform discharges seen. Head CT no acute changes. She was back in the ER January 2018 with suspected seizure activity, but at that time had stopped seizure medication 2 months prior due to headaches. EEG showed normal drowsy and sleep pattern. Due to potential lethargy/drowsiness on Keppra, she was switched to Vimpat 39m BID. She was back in the ER in September 2018 for shortness of breath, then had a seizure in the ER. In the past, she often had complaints of SOB prior to seizures and it was postulated that hypoxemia precipitates the seizures. During her admission, she again became hypoxic and had another seizure. Vimpat dose increased to 1528mBID and discharged on home O2. She was again in the ER in October 2018 for respiratory distress then witnessed 5-10 seizures en route. She was given IV Ativan. Her O2 sats were fluctuating and was felt to be in sepsis. She was briefly intubated for airway protection. EEG done reported frequent generalized sharp transients, at times more prominent over the left hemisphere, other times on the right hemisphere. She had difficulties with respiratory distress during her stay with worsening mental status. I personally  reviewed MRI brain with and without contrast which did not show any acute changes, hippocampi symmetric with no abnormal signal or enhancement. Depakote was added on, with improvement in mental status. She was discharged home, then back in the hospital 2 weeks later for seizure, gait instability, and difficulty  with vision, nausea/vomiting. Depakote level was 100, dose reduced to 560m BID and low dose Keppra 2537mBID was added to Vimpat 20034mID. In December 2018, she felt dizzy and fell, and used her medical alert device. EMS witnessed a seizure with staring lasting 2-3 minutes. Depakote dose was further reduced to 250m41mD, Keppra was stopped, and Trileptal was added to Vimpat.   She now lives with her son, which she is not happy with (previously living alone). Since the fall on 07/06/17, she has had daily headaches, initially in the back of her head, then when worse radiating to her temples with stabbing pain. She has been taking Excedrin 2 tabs BID since December. She has nausea/vomiting, her right eye hurts, there is sensitivity to light. No diplopia or visual obscurations. No focal numbness/tingling/weakness. She has pain in her legs at night, gabapentin was not helping. She reports they are not that bad. She is very anxious to get off Depakote, reporting shakiness started only after VPA was introduced. She had previously been on Dilantin which made her feel drunk, Keppra worsened headaches. She has neck pain and some back pain. She is forgetting a lot, which is a new symptom as well. She is concerned about nose bleeds. They report that prior to October, she had been seizure-free for 3-4 years, however review of Epic records indicate otherwise. She reports sleep is good, she takes Requip for RLS.   Epilepsy Risk Factors:  A maternal aunt had seizures. Otherwise as far as she knows she had a normal birth and early development.  There is no history of febrile convulsions, CNS infections such as meningitis/encephalitis, significant traumatic brain injury, neurosurgical procedures. She has hearing loss due to recurrent ear infections as a child in the orphanage.  Laboratory Data: Lab Results  Component Value Date   WBC 4.3 08/15/2017   HGB 13.3 08/15/2017   HCT 41.3 08/15/2017   MCV 85.2 08/15/2017   PLT  198 08/15/2017     Chemistry      Component Value Date/Time   NA 142 08/17/2017 0558   NA 139 11/05/2013 1138   K 3.1 (L) 08/17/2017 0558   K 3.3 (L) 11/05/2013 1138   CL 100 (L) 08/17/2017 0558   CL 103 11/05/2013 1138   CO2 30 08/17/2017 0558   CO2 29 11/05/2013 1138   BUN 12 08/17/2017 0558   BUN 9 11/05/2013 1138   CREATININE 0.57 08/17/2017 0558   CREATININE 0.35 (L) 11/05/2013 1138      Component Value Date/Time   CALCIUM 8.8 (L) 08/17/2017 0558   CALCIUM 8.9 11/05/2013 1138   ALKPHOS 172 (H) 08/14/2017 1622   ALKPHOS 145 (H) 11/05/2013 1138   AST 31 08/14/2017 1622   AST 48 (H) 11/05/2013 1138   ALT 17 08/14/2017 1622   ALT 35 11/05/2013 1138   BILITOT 0.8 08/14/2017 1622   BILITOT 0.3 11/05/2013 1138     Lab Results  Component Value Date   VALPROATE 16 (L) 07/06/2017     PAST MEDICAL HISTORY: Past Medical History:  Diagnosis Date  . Asthma   . Cirrhosis, non-alcoholic (HCC)[place]. COPD (chronic obstructive pulmonary disease) (HCC)[place]. Deaf   .  Depression   . Diabetes mellitus without complication ([place])   . GERD (gastroesophageal reflux disease)   . Heart murmur   . Hepatitis   . Hypertension   . Lymph node disorder    arm  . Neuropathy   . On home oxygen therapy    hs  . Orthopnea   . RLS (restless legs syndrome)   . Seizures ([place])   . Shortness of breath dyspnea   . Sleep apnea   . Stroke [facility])    tia    PAST SURGICAL HISTORY: Past Surgical History:  Procedure Laterality Date  . CATARACT EXTRACTION W/PHACO Right 11/23/2014   Procedure: CATARACT EXTRACTION PHACO AND INTRAOCULAR LENS PLACEMENT (IOC);  Surgeon: Lyla Glassing, MD;  Location: [facility] ORS;  Service: Ophthalmology;  Laterality: Right;  . CATARACT EXTRACTION W/PHACO Left 12/14/2014   Procedure: CATARACT EXTRACTION PHACO AND INTRAOCULAR LENS PLACEMENT (IOC);  Surgeon: Lyla Glassing, MD;  Location: [facility] ORS;  Service: Ophthalmology;  Laterality: Left;  US:01:16.6 AP:15.8 CDE:12.14  .  CESAREAN SECTION    . CHOLECYSTECTOMY    . KNEE ARTHROPLASTY    . THUMB ARTHROSCOPY    . TONSILLECTOMY    . TYMPANOPLASTY     muliple    MEDICATIONS: Current Outpatient Medications on File Prior to Visit  Medication Sig Dispense Refill  . acidophilus (RISAQUAD) CAPS capsule Take 1 capsule by mouth daily. 30 capsule 0  . albuterol (PROVENTIL HFA;VENTOLIN HFA) 108 (90 BASE) MCG/ACT inhaler Inhale 2 puffs into the lungs every 4 (four) hours as needed for wheezing or shortness of breath.    Marland Kitchen albuterol (PROVENTIL) (2.5 MG/3ML) 0.083% nebulizer solution Take 3 mLs (2.5 mg total) by nebulization every 4 (four) hours as needed for wheezing or shortness of breath. 90 mL 2  . citalopram (CELEXA) 20 MG tablet Take 1 tablet (20 mg total) by mouth daily. 30 tablet 0  . clopidogrel (PLAVIX) 75 MG tablet Take 1 tablet (75 mg total) by mouth daily. 30 tablet 1  . diltiazem (DILACOR XR) 180 MG 24 hr capsule Take 180 mg by mouth daily.    . diphenoxylate-atropine (LOMOTIL) 2.5-0.025 MG tablet Take 2 tablets by mouth 4 (four) times daily as needed for diarrhea or loose stools. 30 tablet 0  . divalproex (DEPAKOTE) 250 MG DR tablet Take 1 tablet (250 mg total) by mouth every 12 (twelve) hours. 60 tablet 0  . feeding supplement, ENSURE ENLIVE, (ENSURE ENLIVE) LIQD Take 237 mLs by mouth 2 (two) times daily between meals. 237 mL 12  . furosemide (LASIX) 40 MG tablet Take 0.5 tablets (20 mg total) by mouth daily. 30 tablet 0  . GLIPIZIDE XL 5 MG 24 hr tablet Take 1 tablet (5 mg total) by mouth daily.    Marland Kitchen lacosamide (VIMPAT) 200 MG TABS tablet Take 1 tablet (200 mg total) by mouth 2 (two) times daily. 60 tablet 0  . magnesium oxide (MAG-OX) 400 (241.3 Mg) MG tablet Take 1 tablet (400 mg total) by mouth daily. 30 tablet 0  . mometasone-formoterol (DULERA) 200-5 MCG/ACT AERO Inhale 2 puffs into the lungs 2 (two) times daily. 1 Inhaler 1  . montelukast (SINGULAIR) 10 MG tablet Take 1 tablet by mouth at bedtime.  4    . omeprazole (PRILOSEC) 20 MG capsule Take 20 mg by mouth daily.    . OXcarbazepine (TRILEPTAL) 150 MG tablet Take 1 tablet (150 mg total) by mouth 2 (two) times daily. 60 tablet 0  . potassium chloride (KLOR-CON) 20 MEQ packet Take  20 mEq by mouth daily. While taking lasix. Take an extra packet if having > 3 loose stools per day 30 packet 0  . rOPINIRole (REQUIP) 0.25 MG tablet Take 1 tablet (0.25 mg total) by mouth 3 (three) times daily. 90 tablet 2  . rosuvastatin (CRESTOR) 10 MG tablet Take 10 mg by mouth daily.  5   No current facility-administered medications on file prior to visit.     ALLERGIES: Allergies  Allergen Reactions  . Aspirin Itching  . Celebrex [Celecoxib] Itching    itching  . Ciprofloxacin Itching  . Codeine Itching  . Fosphenytoin Itching  . Levaquin [Levofloxacin In D5w] Itching  . Levofloxacin Itching  . Lovastatin Itching  . Penicillins     Documentation indicates severe reaction  Pt tolerated cephalosporin without adverse reaction 09/18   . Pravastatin Itching  . Sulfa Antibiotics Itching    FAMILY HISTORY: Family History  Problem Relation Age of Onset  . Lung cancer Mother   . CAD Father     SOCIAL HISTORY: Social History   Socioeconomic History  . Marital status: Widowed    Spouse name: Not on file  . Number of children: Not on file  . Years of education: Not on file  . Highest education level: Not on file  Social Needs  . Financial resource strain: Not on file  . Food insecurity - worry: Not on file  . Food insecurity - inability: Not on file  . Transportation needs - medical: Not on file  . Transportation needs - non-medical: Not on file  Occupational History  . Not on file  Tobacco Use  . Smoking status: Current Every Day Smoker    Packs/day: 0.50  . Smokeless tobacco: Never Used  Substance and Sexual Activity  . Alcohol use: No  . Drug use: No  . Sexual activity: Not on file  Other Topics Concern  . Not on file  Social  History Narrative  . Not on file    REVIEW OF SYSTEMS: Constitutional: No fevers, chills, or sweats, no generalized fatigue, change in appetite Eyes: No visual changes, double vision, eye pain Ear, nose and throat: No hearing loss, ear pain, nasal congestion, sore throat Cardiovascular: No chest pain, palpitations Respiratory:  No shortness of breath at rest or with exertion, wheezes GastrointestinaI: No nausea, vomiting, diarrhea, abdominal pain, fecal incontinence Genitourinary:  No dysuria, urinary retention or frequency Musculoskeletal:  + neck pain, back pain Integumentary: No rash, pruritus, skin lesions Neurological: as above Psychiatric: No depression, insomnia, anxiety Endocrine: No palpitations, fatigue, diaphoresis, mood swings, change in appetite, change in weight, increased thirst Hematologic/Lymphatic:  No anemia, purpura, petechiae. Allergic/Immunologic: no itchy/runny eyes, nasal congestion, recent allergic reactions, rashes  PHYSICAL EXAM: Vitals:   08/10/17 1411  BP: (!) 152/80  Pulse: 61  SpO2: 92%   General: No acute distress, she is able to read lips and mouth words (per records she can speak but prefers to be mute because she "talks funny"), communicates by sign language and lip reading Head:  Normocephalic/atraumatic Eyes: Fundoscopic exam shows bilateral sharp discs, no vessel changes, exudates, or hemorrhages Neck: supple, no paraspinal tenderness, full range of motion Back: No paraspinal tenderness Heart: regular rate and rhythm Lungs: Clear to auscultation bilaterally. Vascular: No carotid bruits. Skin/Extremities: No rash, no edema Neurological Exam: Mental status: alert and oriented to person, place, and time, no dysarthria or aphasia, Fund of knowledge is appropriate.  Recent and remote memory are intact.  Attention and concentration are normal.  Able to name objects and repeat phrases. (communicates by sign language) Cranial nerves: CN I: not  tested CN II: pupils equal, round and reactive to light, visual fields intact, fundi unremarkable. CN III, IV, VI:  full range of motion, no nystagmus, no ptosis CN V: facial sensation intact CN VII: upper and lower face symmetric CN VIII: hearing intact to finger rub CN IX, X: gag intact, uvula midline CN XI: sternocleidomastoid and trapezius muscles intact CN XII: tongue midline Bulk & Tone: normal, no fasciculations. Motor: 5/5 throughout with no pronator drift. Sensation: intact to light touch, cold, pin, vibration and joint position sense.  No extinction to double simultaneous stimulation.  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 She has quite a complicated history, extensive review of records have raised the possibility of non-epileptic events, she may have co-existing epilepsy and non-epileptic events. Seizures suggestive of focal onset. She has one EEG reporting generalized discharges with shifting asymmetry. MRI brain no focal abnormalities. She is now on Trileptal 164m BID, Vimpat 2041mBID, and interested in tapering off Depakote. Reduce to 25053mhs x 1 week, then stop. A 48-hour EEG will be ordered to further classify her seizures. She is reporting daily headaches since her fall, originating in the back of her head. She has a lot of neck pain, possibly cervicogenic headaches worsened by medication overuse headaches from Excedrin. She was advised to start reducing  Excedrin to 2-3 times a week. She will start low dose nortriptyline 67m61ms for headache prophylaxis. MRI cervical spine with and without contrast will be ordered to assess for underlying structural abnormality. Gait is very unsteady, refer to Balance therapy, use walker at all times. She does not drive. She will follow-up after tests and knows to call for any changes.   The duration of this appointment visit was 60 minutes of face-to-face time with the patient.  Greater than 50% of this time was spent in counseling, explanation of diagnosis, planning of further management, and coordination of care. This did not include the 40 min of record review which was detailed above, which was non face to face time.   Thank you for allowing me to participate in the care of this patient. Please do not hesitate to call for any questions or concerns.    KareEllouise NewerD.  CC: [facility]. SchaClearnce Hasten

## 2017-08-14 ENCOUNTER — Emergency Department: Payer: Medicare HMO

## 2017-08-14 ENCOUNTER — Other Ambulatory Visit: Payer: Self-pay

## 2017-08-14 ENCOUNTER — Inpatient Hospital Stay
Admission: EM | Admit: 2017-08-14 | Discharge: 2017-08-17 | DRG: 871 | Disposition: A | Discharge status: Home Health | Payer: Medicare HMO | Attending: Family Medicine | Admitting: Family Medicine

## 2017-08-14 DIAGNOSIS — G2581 Restless legs syndrome: Secondary | ICD-10-CM | POA: Diagnosis present

## 2017-08-14 DIAGNOSIS — E119 Type 2 diabetes mellitus without complications: Secondary | ICD-10-CM | POA: Diagnosis not present

## 2017-08-14 DIAGNOSIS — Z8249 Family history of ischemic heart disease and other diseases of the circulatory system: Secondary | ICD-10-CM

## 2017-08-14 DIAGNOSIS — K219 Gastro-esophageal reflux disease without esophagitis: Secondary | ICD-10-CM | POA: Diagnosis present

## 2017-08-14 DIAGNOSIS — J440 Chronic obstructive pulmonary disease with acute lower respiratory infection: Secondary | ICD-10-CM | POA: Diagnosis present

## 2017-08-14 DIAGNOSIS — H9190 Unspecified hearing loss, unspecified ear: Secondary | ICD-10-CM | POA: Diagnosis present

## 2017-08-14 DIAGNOSIS — I5033 Acute on chronic diastolic (congestive) heart failure: Secondary | ICD-10-CM | POA: Diagnosis present

## 2017-08-14 DIAGNOSIS — I110 Hypertensive heart disease with heart failure: Secondary | ICD-10-CM | POA: Diagnosis present

## 2017-08-14 DIAGNOSIS — Z9049 Acquired absence of other specified parts of digestive tract: Secondary | ICD-10-CM

## 2017-08-14 DIAGNOSIS — Z961 Presence of intraocular lens: Secondary | ICD-10-CM | POA: Diagnosis present

## 2017-08-14 DIAGNOSIS — J9621 Acute and chronic respiratory failure with hypoxia: Secondary | ICD-10-CM | POA: Diagnosis present

## 2017-08-14 DIAGNOSIS — R0602 Shortness of breath: Secondary | ICD-10-CM | POA: Diagnosis not present

## 2017-08-14 DIAGNOSIS — R748 Abnormal levels of other serum enzymes: Secondary | ICD-10-CM | POA: Diagnosis present

## 2017-08-14 DIAGNOSIS — Z801 Family history of malignant neoplasm of trachea, bronchus and lung: Secondary | ICD-10-CM | POA: Diagnosis not present

## 2017-08-14 DIAGNOSIS — I509 Heart failure, unspecified: Secondary | ICD-10-CM

## 2017-08-14 DIAGNOSIS — I10 Essential (primary) hypertension: Secondary | ICD-10-CM | POA: Diagnosis present

## 2017-08-14 DIAGNOSIS — A419 Sepsis, unspecified organism: Principal | ICD-10-CM | POA: Diagnosis present

## 2017-08-14 DIAGNOSIS — Z888 Allergy status to other drugs, medicaments and biological substances status: Secondary | ICD-10-CM

## 2017-08-14 DIAGNOSIS — E1140 Type 2 diabetes mellitus with diabetic neuropathy, unspecified: Secondary | ICD-10-CM | POA: Diagnosis present

## 2017-08-14 DIAGNOSIS — R112 Nausea with vomiting, unspecified: Secondary | ICD-10-CM | POA: Diagnosis present

## 2017-08-14 DIAGNOSIS — R0902 Hypoxemia: Secondary | ICD-10-CM

## 2017-08-14 DIAGNOSIS — K7460 Unspecified cirrhosis of liver: Secondary | ICD-10-CM | POA: Diagnosis present

## 2017-08-14 DIAGNOSIS — R197 Diarrhea, unspecified: Secondary | ICD-10-CM | POA: Diagnosis present

## 2017-08-14 DIAGNOSIS — F17200 Nicotine dependence, unspecified, uncomplicated: Secondary | ICD-10-CM | POA: Diagnosis present

## 2017-08-14 DIAGNOSIS — G4730 Sleep apnea, unspecified: Secondary | ICD-10-CM | POA: Diagnosis present

## 2017-08-14 DIAGNOSIS — Z881 Allergy status to other antibiotic agents status: Secondary | ICD-10-CM

## 2017-08-14 DIAGNOSIS — Z9981 Dependence on supplemental oxygen: Secondary | ICD-10-CM

## 2017-08-14 DIAGNOSIS — Z7984 Long term (current) use of oral hypoglycemic drugs: Secondary | ICD-10-CM

## 2017-08-14 DIAGNOSIS — F329 Major depressive disorder, single episode, unspecified: Secondary | ICD-10-CM | POA: Diagnosis present

## 2017-08-14 DIAGNOSIS — J189 Pneumonia, unspecified organism: Secondary | ICD-10-CM | POA: Diagnosis present

## 2017-08-14 DIAGNOSIS — Z880 Allergy status to penicillin: Secondary | ICD-10-CM

## 2017-08-14 DIAGNOSIS — Z8673 Personal history of transient ischemic attack (TIA), and cerebral infarction without residual deficits: Secondary | ICD-10-CM | POA: Diagnosis not present

## 2017-08-14 DIAGNOSIS — Z79899 Other long term (current) drug therapy: Secondary | ICD-10-CM

## 2017-08-14 DIAGNOSIS — Z86711 Personal history of pulmonary embolism: Secondary | ICD-10-CM

## 2017-08-14 DIAGNOSIS — R7989 Other specified abnormal findings of blood chemistry: Secondary | ICD-10-CM

## 2017-08-14 DIAGNOSIS — R778 Other specified abnormalities of plasma proteins: Secondary | ICD-10-CM

## 2017-08-14 DIAGNOSIS — Z886 Allergy status to analgesic agent status: Secondary | ICD-10-CM

## 2017-08-14 DIAGNOSIS — Z885 Allergy status to narcotic agent status: Secondary | ICD-10-CM

## 2017-08-14 DIAGNOSIS — Z7902 Long term (current) use of antithrombotics/antiplatelets: Secondary | ICD-10-CM

## 2017-08-14 LAB — CBC WITH DIFFERENTIAL/PLATELET
Basophils Absolute: 0 10*3/uL (ref 0–0.1)
Basophils Relative: 1 %
EOS PCT: 2 %
Eosinophils Absolute: 0.1 10*3/uL (ref 0–0.7)
HCT: 44.3 % (ref 35.0–47.0)
Hemoglobin: 15 g/dL (ref 12.0–16.0)
LYMPHS PCT: 28 %
Lymphs Abs: 1.9 10*3/uL (ref 1.0–3.6)
MCH: 28.6 pg (ref 26.0–34.0)
MCHC: 33.9 g/dL (ref 32.0–36.0)
MCV: 84.3 fL (ref 80.0–100.0)
MONO ABS: 0.4 10*3/uL (ref 0.2–0.9)
Monocytes Relative: 6 %
Neutro Abs: 4.4 10*3/uL (ref 1.4–6.5)
Neutrophils Relative %: 63 %
PLATELETS: 195 10*3/uL (ref 150–440)
RBC: 5.26 MIL/uL — ABNORMAL HIGH (ref 3.80–5.20)
RDW: 13.3 % (ref 11.5–14.5)
WBC: 6.9 10*3/uL (ref 3.6–11.0)

## 2017-08-14 LAB — COMPREHENSIVE METABOLIC PANEL
ALBUMIN: 3.9 g/dL (ref 3.5–5.0)
ALT: 17 U/L (ref 14–54)
AST: 31 U/L (ref 15–41)
Alkaline Phosphatase: 172 U/L — ABNORMAL HIGH (ref 38–126)
Anion gap: 12 (ref 5–15)
BUN: 10 mg/dL (ref 6–20)
CO2: 28 mmol/L (ref 22–32)
Calcium: 9.2 mg/dL (ref 8.9–10.3)
Chloride: 103 mmol/L (ref 101–111)
Creatinine, Ser: 0.37 mg/dL — ABNORMAL LOW (ref 0.44–1.00)
GFR calc Af Amer: >60 mL/min (ref >60)
GFR calc non Af Amer: >60 mL/min (ref >60)
GLUCOSE: 138 mg/dL — AB (ref 65–99)
POTASSIUM: 2.7 mmol/L — AB (ref 3.5–5.1)
SODIUM: 143 mmol/L (ref 135–145)
Total Bilirubin: 0.8 mg/dL (ref 0.3–1.2)
Total Protein: 7.3 g/dL (ref 6.5–8.1)

## 2017-08-14 LAB — APTT: aPTT: 31 seconds (ref 24–36)

## 2017-08-14 LAB — BLOOD GAS, VENOUS
Acid-Base Excess: 4.2 mmol/L — ABNORMAL HIGH (ref 0.0–2.0)
Bicarbonate: 29.8 mmol/L — ABNORMAL HIGH (ref 20.0–28.0)
O2 SAT: 84.6 %
PATIENT TEMPERATURE: 37
PCO2 VEN: 47 mmHg (ref 44.0–60.0)
PO2 VEN: 49 mmHg — AB (ref 32.0–45.0)
pH, Ven: 7.41 (ref 7.250–7.430)

## 2017-08-14 LAB — URINALYSIS, ROUTINE W REFLEX MICROSCOPIC
Bilirubin Urine: NEGATIVE
GLUCOSE, UA: NEGATIVE mg/dL
Hgb urine dipstick: NEGATIVE
KETONES UR: 5 mg/dL — AB
Nitrite: NEGATIVE
PH: 6 (ref 5.0–8.0)
Protein, ur: NEGATIVE mg/dL

## 2017-08-14 LAB — PROTIME-INR
INR: 1.02
PROTHROMBIN TIME: 13.3 s (ref 11.4–15.2)

## 2017-08-14 LAB — BRAIN NATRIURETIC PEPTIDE: B NATRIURETIC PEPTIDE 5: 441 pg/mL — AB (ref 0.0–100.0)

## 2017-08-14 LAB — INFLUENZA PANEL BY PCR (TYPE A & B)
INFLAPCR: NEGATIVE
INFLBPCR: NEGATIVE

## 2017-08-14 LAB — TROPONIN I
Troponin I: 0.03 ng/mL (ref <0.03)
Troponin I: 0.03 ng/mL (ref <0.03)

## 2017-08-14 LAB — LACTIC ACID, PLASMA
Lactic Acid, Venous: 1 mmol/L (ref 0.5–1.9)
Lactic Acid, Venous: 0.9 mmol/L (ref 0.5–1.9)

## 2017-08-14 MED ORDER — SODIUM CHLORIDE 0.9 % IV BOLUS (SEPSIS)
1000.0000 mL | Freq: Once | INTRAVENOUS | Status: AC
  Administered 2017-08-14: 1000 mL via INTRAVENOUS

## 2017-08-14 MED ORDER — VANCOMYCIN HCL IN DEXTROSE 1-5 GM/200ML-% IV SOLN
1000.0000 mg | Freq: Once | INTRAVENOUS | Status: AC
  Administered 2017-08-14: 1000 mg via INTRAVENOUS
  Filled 2017-08-14: qty 200

## 2017-08-14 MED ORDER — IOPAMIDOL (ISOVUE-370) INJECTION 76%
75.0000 mL | Freq: Once | INTRAVENOUS | Status: AC | PRN
  Administered 2017-08-14: 75 mL via INTRAVENOUS

## 2017-08-14 MED ORDER — POTASSIUM CHLORIDE CRYS ER 20 MEQ PO TBCR
40.0000 meq | EXTENDED_RELEASE_TABLET | Freq: Once | ORAL | Status: AC
  Administered 2017-08-14: 40 meq via ORAL
  Filled 2017-08-14: qty 2

## 2017-08-14 MED ORDER — POTASSIUM CHLORIDE 10 MEQ/100ML IV SOLN
10.0000 meq | INTRAVENOUS | Status: AC
  Administered 2017-08-14: 10 meq via INTRAVENOUS
  Filled 2017-08-14 (×4): qty 100

## 2017-08-14 MED ORDER — ONDANSETRON HCL 4 MG/2ML IJ SOLN
4.0000 mg | Freq: Once | INTRAMUSCULAR | Status: AC
  Administered 2017-08-14: 4 mg via INTRAVENOUS
  Filled 2017-08-14: qty 2

## 2017-08-14 MED ORDER — IPRATROPIUM-ALBUTEROL 0.5-2.5 (3) MG/3ML IN SOLN
3.0000 mL | Freq: Once | RESPIRATORY_TRACT | Status: AC
Start: 2017-08-14 — End: 2017-08-14
  Administered 2017-08-14: 3 mL via RESPIRATORY_TRACT
  Filled 2017-08-14: qty 3

## 2017-08-14 MED ORDER — PIPERACILLIN-TAZOBACTAM 3.375 G IVPB 30 MIN
3.3750 g | Freq: Once | INTRAVENOUS | Status: AC
  Administered 2017-08-14: 3.375 g via INTRAVENOUS
  Filled 2017-08-14: qty 50

## 2017-08-14 NOTE — ED Notes (Signed)
Assisted patient to the restroom.

## 2017-08-14 NOTE — ED Provider Notes (Addendum)
[facility] Emergency Department Provider Note  ____________________________________________  Time seen: Approximately 4:14 PM  I have reviewed the triage vital signs and the nursing notes.   HISTORY  Chief Complaint Shortness of Breath    HPI JERIKA WALES is a 67 y.o. female with a history of deafness, COPD on 2 L nasal cannula only at night, CHF, presenting for shortness of breath, nausea and vomiting, diarrhea.  The patient reports that for the past 3 days, she has had a cough that is productive intermittently of a small amount of phlegm.  She has not had any significant congestion or rhinorrhea, sore throat, or ear pain.  No fevers or chills.  No lower extremity swelling.  In addition, the patient has developed multiple episodes of nausea and vomiting and nonbloody diarrhea.  She notes that approximately 1 month ago, she was taken off her diuretic "because she did not need anymore."  The patient is able to give full history, and is also accompanied by her daughter, who helps with history.   Past Medical History:  Diagnosis Date  . Asthma   . Cirrhosis, non-alcoholic ([place])   . COPD (chronic obstructive pulmonary disease) ([employer])   . Deaf   . Depression   . Diabetes mellitus without complication ([place])   . GERD (gastroesophageal reflux disease)   . Heart murmur   . Hepatitis   . Hypertension   . Lymph node disorder    arm  . Neuropathy   . On home oxygen therapy    hs  . Orthopnea   . RLS (restless legs syndrome)   . Seizures ([place])   . Shortness of breath dyspnea   . Sleep apnea   . Stroke [facility])    tia    Patient Active Problem List   Diagnosis Date Noted  . Seizures ([place]) 06/15/2017  . Goals of care, counseling/discussion 05/06/2017  . Status epilepticus ([place])   . HCAP (healthcare-associated pneumonia)   . Community acquired pneumonia of left lower lobe of lung ([place])   . Hypoxia   . Acute on chronic respiratory failure with hypoxia  ([place]) 03/24/2017  . Hypokalemia 02/12/2017  . Sepsis ([place]) 02/11/2017  . GERD (gastroesophageal reflux disease) 03/23/2016  . Depression 03/23/2016  . Convulsions/seizures ([place]) 03/22/2016  . DM (diabetes mellitus), type 2 ([place]) 03/22/2016  . Essential hypertension 03/22/2016  . Neuropathy 03/22/2016  . Convulsions ([place]) 03/22/2016  . COPD exacerbation ([place]) 03/31/2015  . Acute respiratory failure with hypoxia ([place]) 03/29/2015    Past Surgical History:  Procedure Laterality Date  . CATARACT EXTRACTION W/PHACO Right 11/23/2014   Procedure: CATARACT EXTRACTION PHACO AND INTRAOCULAR LENS PLACEMENT (IOC);  Surgeon: Lyla Glassing, MD;  Location: [facility] ORS;  Service: Ophthalmology;  Laterality: Right;  . CATARACT EXTRACTION W/PHACO Left 12/14/2014   Procedure: CATARACT EXTRACTION PHACO AND INTRAOCULAR LENS PLACEMENT (IOC);  Surgeon: Lyla Glassing, MD;  Location: [facility] ORS;  Service: Ophthalmology;  Laterality: Left;  US:01:16.6 AP:15.8 CDE:12.14  . CESAREAN SECTION    . CHOLECYSTECTOMY    . KNEE ARTHROPLASTY    . THUMB ARTHROSCOPY    . TONSILLECTOMY    . TYMPANOPLASTY     muliple    Current Outpatient Rx  . Order #: [id number] Class: Print  . Order #: [id number] Class: Historical Med  . Order #: [id number] Class: Print  . Order #: [id number] Class: Print  . Order #: [id number] Class: Print  . Order #: [id number] Class: Historical Med  . Order #: [id number] Class: Print  . Order #: [id number] Class:  Normal  . Order #: [id number] Class: Print  . Order #: [id number] Class: No Print  . Order #: [id number] Class: Print  . Order #: [id number] Class: Print  . Order #: [id number] Class: Print  . Order #: [id number] Class: Historical Med  . Order #: [id number] Class: Normal  . Order #: [id number] Class: Historical Med  . Order #: [id number] Class: Print  . Order #: [id number] Class: Print  . Order #: [id number] Class: Normal  . Order #: [id number] Class: Historical Med    Allergies Aspirin; Celebrex [celecoxib];  Ciprofloxacin; Codeine; Fosphenytoin; Levaquin [levofloxacin in d5w]; Levofloxacin; Lovastatin; Penicillins; Pravastatin; and Sulfa antibiotics  Family History  Problem Relation Age of Onset  . Lung cancer Mother   . CAD Father     Social History Social History   Tobacco Use  . Smoking status: Current Every Day Smoker    Packs/day: 0.50  . Smokeless tobacco: Never Used  Substance Use Topics  . Alcohol use: No  . Drug use: No    Review of Systems Constitutional: No fever/chills.  No lightheadedness or syncope. Eyes: No visual changes.  No eye discharge. ENT: No sore throat. No congestion or rhinorrhea.  No ear pain. Cardiovascular: Denies chest pain. Denies palpitations. Respiratory: Positive shortness of breath.  Positive productive cough. Gastrointestinal: No abdominal pain.  Positive nausea, positive vomiting.  Positive diarrhea.  No constipation. Genitourinary: Negative for dysuria. Musculoskeletal: Negative for back pain. Skin: Negative for rash. Neurological: Negative for headaches. No focal numbness, tingling or weakness.     ____________________________________________   PHYSICAL EXAM:  VITAL SIGNS: ED Triage Vitals  Enc Vitals Group     BP 08/14/17 1611 (!) 189/83     Pulse Rate 08/14/17 1611 67     Resp 08/14/17 1611 18     Temp 08/14/17 1611 97.8 F (36.6 C)     Temp Source 08/14/17 1611 Oral     SpO2 08/14/17 1611 93 %     Weight 08/14/17 1608 140 lb (63.5 kg)     Height 08/14/17 1608 _0  (1.6 m)     Head Circumference --      [place] --      Pain Score --      Pain Loc --      Pain Edu? --      Excl. in [place]? --     Constitutional: Patient is alert and oriented and answers questions appropriately.  She is able to understand everything I am saying by reading this.  She is comfortable appearing and nontoxic. Eyes: Conjunctivae are normal.  EOMI. No scleral icterus.  No eye discharge. Head: Atraumatic. Nose: No  congestion/rhinnorhea. Mouth/Throat: Mucous membranes are moist.  Neck: No stridor.  Supple.  No JVD.  No meningismus. Cardiovascular: Normal rate, regular rhythm. No murmurs, rubs or gallops.  Respiratory: The patient is tachypneic with accessory muscle use and mild retractions.  Her O2 saturations are 88% on room air.  She has end expiratory wheezing which is most prominent in the right lung field, with significantly decreased breath sounds on the left lung field. Gastrointestinal: Obese.  Soft, nontender and nondistended.  No guarding or rebound.  No peritoneal signs. Musculoskeletal: No LE edema. No ttp in the calves or palpable cords.  Negative Homan's sign. Neurologic:  A&Ox3.  Speech is clear.  Face and smile are symmetric.  EOMI.  Moves all extremities well. Skin:  Skin is warm, dry and intact. No rash noted. Psychiatric: Mood and affect are normal. Speech and behavior are normal.  Normal judgement.  ____________________________________________  LABS (all labs ordered are listed, but only abnormal results are displayed)  Labs Reviewed  COMPREHENSIVE METABOLIC PANEL - Abnormal; Notable for the following components:      Result Value   Potassium 2.7 (*)    Glucose, Bld 138 (*)    Creatinine, Ser 0.37 (*)    Alkaline Phosphatase 172 (*)    All other components within normal limits  CBC WITH DIFFERENTIAL/PLATELET - Abnormal; Notable for the following components:   RBC 5.26 (*)    All other components within normal limits  TROPONIN I - Abnormal; Notable for the following components:   Troponin I 0.03 (*)    All other components within normal limits  BRAIN NATRIURETIC PEPTIDE - Abnormal; Notable for the following components:   B Natriuretic Peptide 441.0 (*)    All other components within normal limits  BLOOD GAS, VENOUS - Abnormal; Notable for the following components:   pO2, Ven 49.0 (*)    Bicarbonate 29.8 (*)    Acid-Base Excess 4.2 (*)    All other components within  normal limits  TROPONIN I - Abnormal; Notable for the following components:   Troponin I 0.03 (*)    All other components within normal limits  CULTURE, BLOOD (ROUTINE X 2)  CULTURE, BLOOD (ROUTINE X 2)  URINE CULTURE  LACTIC ACID, PLASMA  LACTIC ACID, PLASMA  APTT  PROTIME-INR  INFLUENZA PANEL BY PCR (TYPE A & B)  URINALYSIS, ROUTINE W REFLEX MICROSCOPIC   ____________________________________________  EKG  ED ECG REPORT I, Eula Listen, the attending physician, personally viewed and interpreted this ECG.   Date: 08/14/2017  EKG Time: 1628  Rate: 66  Rhythm: normal sinus rhythm  Axis: normal  Intervals:prolonged QTc  ST&T Change: no STEMI  ____________________________________________  RADIOLOGY  Dg Chest 2 View  Result Date: 08/14/2017 CLINICAL DATA:  67 year old female with shortness of breath nausea and vomiting. EXAM: CHEST  2 VIEW COMPARISON:  Chest radiographs 06/16/2017 and earlier. FINDINGS: Semi upright AP and lateral views of the chest. Continued small bilateral pleural effusions. Stable lung volumes. Stable pulmonary vascularity without acute edema. Stable cardiac size and mediastinal contours. Calcified aortic atherosclerosis. Visualized tracheal air column is within normal limits. No pneumothorax or acute pulmonary opacity. Osteopenia. No acute osseous abnormality identified. Negative visible bowel gas pattern. IMPRESSION: Stable chest since December with small bilateral pleural effusions. No new cardiopulmonary abnormality. Electronically Signed   By: Genevie Ann M.D.   On: 08/14/2017 17:00   Ct Angio Chest Pe W And/or Wo Contrast  Result Date: 08/14/2017 CLINICAL DATA:  Shortness of breath, nausea, vomiting, and diarrhea. Productive cough. EXAM: CT ANGIOGRAPHY CHEST WITH CONTRAST TECHNIQUE: Multidetector CT imaging of the chest was performed using the standard protocol during bolus administration of intravenous contrast. Multiplanar CT image reconstructions and  MIPs were obtained to evaluate the vascular anatomy. CONTRAST:  74m ISOVUE-370 IOPAMIDOL (ISOVUE-370) INJECTION 76% COMPARISON:  Two-view chest x-ray 08/14/2017. CTA of the chest 04/05/2017. FINDINGS: Cardiovascular: The heart is mildly enlarged. Atherosclerotic calcifications are present. Atherosclerotic calcifications are present in the aorta including the arch and descending aorta. Pulmonary artery opacification is satisfactory. There are no focal filling defects to suggest pulmonary embolism. Mediastinum/Nodes: Subcentimeter paratracheal lymph nodes are again noted. No significant hilar or mediastinal adenopathy is present. The esophagus is unremarkable. Lungs/Pleura: Scattered ground-glass attenuation is present throughout both lungs. Moderate pleural effusions are present. Left greater than right lower lobe airspace disease is present. No other significant consolidation is present. Upper Abdomen: Cholecystectomy is  noted. Upper abdomen is otherwise unremarkable. Musculoskeletal: Exaggerated kyphosis is again noted. No focal lytic or blastic lesions are present. Review of the MIP images confirms the above findings. IMPRESSION: 1. No pulmonary embolus. 2. Moderate bilateral pleural effusions. 3. Left greater than right basilar airspace disease. This is greater than expected for atelectasis. Infection or aspiration is considered. 4. Scattered ground-glass attenuation bilaterally suggesting edema. 5. Mild cardiomegaly. 6.  Aortic Atherosclerosis (ICD10-I70.0). Electronically Signed   By: San Morelle M.D.   On: 08/14/2017 20:54    ____________________________________________   PROCEDURES  Procedure(s) performed: None  Procedures  Critical Care performed: No ____________________________________________   INITIAL IMPRESSION / ASSESSMENT AND PLAN / ED COURSE  Pertinent labs & imaging results that were available during my care of the patient were reviewed by me and considered in my medical  decision making (see chart for details).  67 y.o. female with a history of COPD on 2 L nasal cannula at night, history of CHF, presenting with 3 days of productive cough, now with nausea vomiting and diarrhea.  On arrival to the emergency department, the patient is hypoxic with O2 sats of 88% and some significant respiratory compromise.  I am concerned about multiple etiologies including pneumonia, influenza, less likely pneumothorax.  I also consider cardiac causes including CHF with pulmonary edema although I would expect a more symmetric auscultatory exam.  At this time, the patient will undergo sepsis protocols, including early and broad-spectrum antibiotics, some fluid although we will follow her BNP and clinical status for this, and will complete testing including laboratory studies, EKG, chest x-ray.  The patient will require admission for continued evaluation and treatment.  The patienti's work up in the emergency department does not show an infiltrate on her chest xray, and she is negative for influenza.  Her lactic acid is also normal and her wbc count is within normal limits.  She does have an elevated troponin, and an elevated BNP.  Her chest xray shows unchanged small bilateral pleural effusions.  I will plan to give the pt aspirin, and she will be admitted to have her troponin trended and for further cardiac work up.  The pt does have a remote hx of PE in the setting of pregnancy, and is anticoagulated with Plavix only.  We will get a CT chest to r/o PE given that the pt has new daytime hypoxia without any significant edema or infiltrate on her chest xray.   ----------------------------------------- 9:19 PM on 08/14/2017 -----------------------------------------  The patient does have bilateral pneumonia on her CT chest, but she does not have any pulmonary embolus.  Her repeat troponin is stable.  At this time I will plan to admit the  patient.      ____________________________________________  FINAL CLINICAL IMPRESSION(S) / ED DIAGNOSES  Final diagnoses:  Hypoxia  Elevated troponin  Acute on chronic congestive heart failure, unspecified heart failure type ([place])  Community acquired pneumonia, unspecified laterality         NEW MEDICATIONS STARTED DURING THIS VISIT:  New Prescriptions   No medications on file      Eula Listen, MD 08/14/17 1931    Eula Listen, MD 08/14/17 Artist Pais    Eula Listen, MD 08/14/17 2119

## 2017-08-14 NOTE — Progress Notes (Signed)
CODE SEPSIS - PHARMACY COMMUNICATION  **Broad Spectrum Antibiotics should be administered within 1 hour of Sepsis diagnosis**  Time Code Sepsis Called/Page Received: 2/8 1615  Antibiotics Ordered: Zosyn, Vancomycin   Time of 1st antibiotic administration: Zosyn @ 1710  Additional action taken by pharmacy: Called nurse at 30 mins after page who said that patient had been taken to get Xray and had just gotten back for them to start Abx   If necessary, Name of Provider/Nurse Contacted: Lowella Grip, PharmD Pharmacy Resident 08/14/2017  5:15 PM

## 2017-08-14 NOTE — ED Triage Notes (Signed)
Pt comes via [facility] with c/o SHOB, nausea and vomiting. Pt is deaf, but able to read lips. FAmily at bedside. Per EMS pt did have witnessed seizure by [employer]. BS-136, BP 196/88, HR-65, 99% nonrebreather. Pt currently at 88-89% on room air and placed on 2 L nasal cannula.

## 2017-08-14 NOTE — ED Notes (Signed)
CODE SEPSIS

## 2017-08-15 ENCOUNTER — Other Ambulatory Visit: Payer: Self-pay

## 2017-08-15 DIAGNOSIS — I110 Hypertensive heart disease with heart failure: Secondary | ICD-10-CM | POA: Diagnosis present

## 2017-08-15 DIAGNOSIS — J9621 Acute and chronic respiratory failure with hypoxia: Secondary | ICD-10-CM | POA: Diagnosis present

## 2017-08-15 DIAGNOSIS — J189 Pneumonia, unspecified organism: Secondary | ICD-10-CM | POA: Diagnosis present

## 2017-08-15 DIAGNOSIS — F17200 Nicotine dependence, unspecified, uncomplicated: Secondary | ICD-10-CM | POA: Diagnosis present

## 2017-08-15 DIAGNOSIS — K219 Gastro-esophageal reflux disease without esophagitis: Secondary | ICD-10-CM | POA: Diagnosis present

## 2017-08-15 DIAGNOSIS — J440 Chronic obstructive pulmonary disease with acute lower respiratory infection: Secondary | ICD-10-CM | POA: Diagnosis present

## 2017-08-15 DIAGNOSIS — R112 Nausea with vomiting, unspecified: Secondary | ICD-10-CM | POA: Diagnosis present

## 2017-08-15 DIAGNOSIS — R0902 Hypoxemia: Secondary | ICD-10-CM | POA: Diagnosis present

## 2017-08-15 DIAGNOSIS — E1140 Type 2 diabetes mellitus with diabetic neuropathy, unspecified: Secondary | ICD-10-CM | POA: Diagnosis present

## 2017-08-15 DIAGNOSIS — Z86711 Personal history of pulmonary embolism: Secondary | ICD-10-CM | POA: Diagnosis not present

## 2017-08-15 DIAGNOSIS — H9190 Unspecified hearing loss, unspecified ear: Secondary | ICD-10-CM | POA: Diagnosis present

## 2017-08-15 DIAGNOSIS — I5033 Acute on chronic diastolic (congestive) heart failure: Secondary | ICD-10-CM | POA: Diagnosis present

## 2017-08-15 DIAGNOSIS — Z9049 Acquired absence of other specified parts of digestive tract: Secondary | ICD-10-CM | POA: Diagnosis not present

## 2017-08-15 DIAGNOSIS — G2581 Restless legs syndrome: Secondary | ICD-10-CM | POA: Diagnosis present

## 2017-08-15 DIAGNOSIS — Z801 Family history of malignant neoplasm of trachea, bronchus and lung: Secondary | ICD-10-CM | POA: Diagnosis not present

## 2017-08-15 DIAGNOSIS — A419 Sepsis, unspecified organism: Secondary | ICD-10-CM | POA: Diagnosis present

## 2017-08-15 DIAGNOSIS — K7460 Unspecified cirrhosis of liver: Secondary | ICD-10-CM | POA: Diagnosis present

## 2017-08-15 DIAGNOSIS — Z9981 Dependence on supplemental oxygen: Secondary | ICD-10-CM | POA: Diagnosis not present

## 2017-08-15 DIAGNOSIS — G4730 Sleep apnea, unspecified: Secondary | ICD-10-CM | POA: Diagnosis present

## 2017-08-15 DIAGNOSIS — Z8673 Personal history of transient ischemic attack (TIA), and cerebral infarction without residual deficits: Secondary | ICD-10-CM | POA: Diagnosis not present

## 2017-08-15 DIAGNOSIS — F329 Major depressive disorder, single episode, unspecified: Secondary | ICD-10-CM | POA: Diagnosis present

## 2017-08-15 DIAGNOSIS — R197 Diarrhea, unspecified: Secondary | ICD-10-CM | POA: Diagnosis present

## 2017-08-15 DIAGNOSIS — R748 Abnormal levels of other serum enzymes: Secondary | ICD-10-CM | POA: Diagnosis present

## 2017-08-15 DIAGNOSIS — Z8249 Family history of ischemic heart disease and other diseases of the circulatory system: Secondary | ICD-10-CM | POA: Diagnosis not present

## 2017-08-15 DIAGNOSIS — Z961 Presence of intraocular lens: Secondary | ICD-10-CM | POA: Diagnosis present

## 2017-08-15 LAB — CBC
HCT: 41.3 % (ref 35.0–47.0)
Hemoglobin: 13.3 g/dL (ref 12.0–16.0)
MCH: 27.5 pg (ref 26.0–34.0)
MCHC: 32.3 g/dL (ref 32.0–36.0)
MCV: 85.2 fL (ref 80.0–100.0)
PLATELETS: 198 10*3/uL (ref 150–440)
RBC: 4.84 MIL/uL (ref 3.80–5.20)
RDW: 13.4 % (ref 11.5–14.5)
WBC: 4.3 10*3/uL (ref 3.6–11.0)

## 2017-08-15 LAB — BASIC METABOLIC PANEL
Anion gap: 12 (ref 5–15)
BUN: 9 mg/dL (ref 6–20)
CALCIUM: 8.6 mg/dL — AB (ref 8.9–10.3)
CHLORIDE: 105 mmol/L (ref 101–111)
CO2: 25 mmol/L (ref 22–32)
CREATININE: 0.51 mg/dL (ref 0.44–1.00)
GFR calc Af Amer: >60 mL/min (ref >60)
GFR calc non Af Amer: >60 mL/min (ref >60)
Glucose, Bld: 163 mg/dL — ABNORMAL HIGH (ref 65–99)
Potassium: 3.4 mmol/L — ABNORMAL LOW (ref 3.5–5.1)
SODIUM: 142 mmol/L (ref 135–145)

## 2017-08-15 LAB — GLUCOSE, CAPILLARY
GLUCOSE-CAPILLARY: 154 mg/dL — AB (ref 65–99)
Glucose-Capillary: 97 mg/dL (ref 65–99)
Glucose-Capillary: 129 mg/dL — ABNORMAL HIGH (ref 65–99)
Glucose-Capillary: 155 mg/dL — ABNORMAL HIGH (ref 65–99)

## 2017-08-15 LAB — TROPONIN I

## 2017-08-15 MED ORDER — MOMETASONE FURO-FORMOTEROL FUM 200-5 MCG/ACT IN AERO
2.0000 | INHALATION_SPRAY | Freq: Two times a day (BID) | RESPIRATORY_TRACT | Status: DC
Start: 2017-08-15 — End: 2017-08-17
  Administered 2017-08-15 – 2017-08-17 (×5): 2 via RESPIRATORY_TRACT
  Filled 2017-08-15: qty 8.8

## 2017-08-15 MED ORDER — ENOXAPARIN SODIUM 40 MG/0.4ML ~~LOC~~ SOLN
40.0000 mg | SUBCUTANEOUS | Status: DC
  Administered 2017-08-15 – 2017-08-16 (×2): 40 mg via SUBCUTANEOUS
  Filled 2017-08-15 (×2): qty 0.4

## 2017-08-15 MED ORDER — INSULIN ASPART 100 UNIT/ML ~~LOC~~ SOLN: 0.0000 [IU] | Freq: Every day | SUBCUTANEOUS | Status: DC

## 2017-08-15 MED ORDER — DEXTROSE 5 % IV SOLN
1.0000 g | Freq: Once | INTRAVENOUS | Status: AC
  Administered 2017-08-15: 1 g via INTRAVENOUS

## 2017-08-15 MED ORDER — PANTOPRAZOLE SODIUM 40 MG PO TBEC
40.0000 mg | DELAYED_RELEASE_TABLET | Freq: Two times a day (BID) | ORAL | Status: DC
  Administered 2017-08-15 – 2017-08-17 (×5): 40 mg via ORAL
  Filled 2017-08-15 (×5): qty 1

## 2017-08-15 MED ORDER — DILTIAZEM HCL ER COATED BEADS 180 MG PO CP24
180.0000 mg | ORAL_CAPSULE | Freq: Every day | ORAL | Status: DC
  Administered 2017-08-15 – 2017-08-17 (×3): 180 mg via ORAL
  Filled 2017-08-15 (×3): qty 1

## 2017-08-15 MED ORDER — ACETAMINOPHEN 650 MG RE SUPP: 650.0000 mg | Freq: Four times a day (QID) | RECTAL | Status: DC | PRN

## 2017-08-15 MED ORDER — ROSUVASTATIN CALCIUM 10 MG PO TABS
10.0000 mg | ORAL_TABLET | Freq: Every day | ORAL | Status: DC
  Administered 2017-08-15 – 2017-08-17 (×3): 10 mg via ORAL
  Filled 2017-08-15 (×3): qty 1

## 2017-08-15 MED ORDER — CLOPIDOGREL BISULFATE 75 MG PO TABS
75.0000 mg | ORAL_TABLET | Freq: Every day | ORAL | Status: DC
  Administered 2017-08-15 – 2017-08-17 (×3): 75 mg via ORAL
  Filled 2017-08-15 (×3): qty 1

## 2017-08-15 MED ORDER — INSULIN ASPART 100 UNIT/ML ~~LOC~~ SOLN
0.0000 [IU] | Freq: Three times a day (TID) | SUBCUTANEOUS | Status: DC
  Administered 2017-08-15: 2 [IU] via SUBCUTANEOUS
  Administered 2017-08-15 – 2017-08-17 (×4): 1 [IU] via SUBCUTANEOUS
  Filled 2017-08-15 (×5): qty 1

## 2017-08-15 MED ORDER — ROPINIROLE HCL 0.25 MG PO TABS
0.2500 mg | ORAL_TABLET | Freq: Three times a day (TID) | ORAL | Status: DC
  Administered 2017-08-15 – 2017-08-17 (×7): 0.25 mg via ORAL
  Filled 2017-08-15 (×11): qty 1

## 2017-08-15 MED ORDER — LACOSAMIDE 50 MG PO TABS
200.0000 mg | ORAL_TABLET | Freq: Two times a day (BID) | ORAL | Status: DC
  Administered 2017-08-15 – 2017-08-17 (×5): 200 mg via ORAL
  Filled 2017-08-15 (×5): qty 4

## 2017-08-15 MED ORDER — MONTELUKAST SODIUM 10 MG PO TABS
10.0000 mg | ORAL_TABLET | Freq: Every day | ORAL | Status: DC
  Administered 2017-08-15 – 2017-08-16 (×2): 10 mg via ORAL
  Filled 2017-08-15 (×2): qty 1

## 2017-08-15 MED ORDER — IPRATROPIUM-ALBUTEROL 0.5-2.5 (3) MG/3ML IN SOLN
3.0000 mL | RESPIRATORY_TRACT | Status: DC | PRN
Start: 2017-08-15 — End: 2017-08-17

## 2017-08-15 MED ORDER — POTASSIUM CHLORIDE 10 MEQ/100ML IV SOLN
10.0000 meq | INTRAVENOUS | Status: AC
  Administered 2017-08-15 (×4): 10 meq via INTRAVENOUS
  Filled 2017-08-15 (×4): qty 100

## 2017-08-15 MED ORDER — ONDANSETRON HCL 4 MG PO TABS: 4.0000 mg | ORAL_TABLET | Freq: Four times a day (QID) | ORAL | Status: DC | PRN

## 2017-08-15 MED ORDER — PANTOPRAZOLE SODIUM 20 MG PO TBEC: 20.0000 mg | DELAYED_RELEASE_TABLET | Freq: Two times a day (BID) | ORAL | Status: DC

## 2017-08-15 MED ORDER — ORAL CARE MOUTH RINSE
15.0000 mL | Freq: Two times a day (BID) | OROMUCOSAL | Status: DC
  Administered 2017-08-15 – 2017-08-17 (×4): 15 mL via OROMUCOSAL

## 2017-08-15 MED ORDER — OXCARBAZEPINE 150 MG PO TABS
150.0000 mg | ORAL_TABLET | Freq: Two times a day (BID) | ORAL | Status: DC
  Administered 2017-08-15 – 2017-08-17 (×5): 150 mg via ORAL
  Filled 2017-08-15 (×7): qty 1

## 2017-08-15 MED ORDER — ACETAMINOPHEN 325 MG PO TABS
650.0000 mg | ORAL_TABLET | Freq: Four times a day (QID) | ORAL | Status: DC | PRN
  Administered 2017-08-15: 650 mg via ORAL
  Filled 2017-08-15: qty 2

## 2017-08-15 MED ORDER — DEXTROSE 5 % IV SOLN
500.0000 mg | INTRAVENOUS | Status: DC
  Administered 2017-08-15 – 2017-08-16 (×2): 500 mg via INTRAVENOUS
  Filled 2017-08-15 (×3): qty 500

## 2017-08-15 MED ORDER — DEXTROSE 5 % IV SOLN
1.0000 g | INTRAVENOUS | Status: DC
  Administered 2017-08-16: 1 g via INTRAVENOUS
  Filled 2017-08-15 (×2): qty 10

## 2017-08-15 MED ORDER — NORTRIPTYLINE HCL 10 MG PO CAPS
10.0000 mg | ORAL_CAPSULE | Freq: Every day | ORAL | Status: DC
  Administered 2017-08-15 – 2017-08-16 (×2): 10 mg via ORAL
  Filled 2017-08-15 (×3): qty 1

## 2017-08-15 MED ORDER — CITALOPRAM HYDROBROMIDE 20 MG PO TABS
20.0000 mg | ORAL_TABLET | Freq: Every day | ORAL | Status: DC
  Administered 2017-08-15 – 2017-08-17 (×3): 20 mg via ORAL
  Filled 2017-08-15 (×3): qty 1

## 2017-08-15 MED ORDER — DEXTROSE 5 % IV SOLN
INTRAVENOUS | Status: AC
  Filled 2017-08-15: qty 10

## 2017-08-15 MED ORDER — FUROSEMIDE 20 MG PO TABS
20.0000 mg | ORAL_TABLET | Freq: Every day | ORAL | Status: DC
  Administered 2017-08-15 – 2017-08-16 (×2): 20 mg via ORAL
  Filled 2017-08-15 (×2): qty 1

## 2017-08-15 MED ORDER — ONDANSETRON HCL 4 MG/2ML IJ SOLN: 4.0000 mg | Freq: Four times a day (QID) | INTRAMUSCULAR | Status: DC | PRN

## 2017-08-15 NOTE — Plan of Care (Signed)
  Progressing Education: Knowledge of General Education information will improve 08/15/2017 0624 - Progressing by Loran Senters, RN Safety: Ability to remain free from injury will improve 08/15/2017 0624 - Progressing by Loran Senters, RN Fluid Volume: Hemodynamic stability will improve 08/15/2017 0624 - Progressing by Loran Senters, RN Clinical Measurements: Diagnostic test results will improve 08/15/2017 0624 - Progressing by Loran Senters, RN Signs and symptoms of infection will decrease 08/15/2017 0624 - Progressing by Loran Senters, RN Respiratory: Ability to maintain adequate ventilation will improve 08/15/2017 0624 - Progressing by Loran Senters, RN

## 2017-08-15 NOTE — Progress Notes (Signed)
Sepsis ([place]) -IV antibiotics, lactic acid was normal, blood pressure stable, cultures sent Active Problems:   CAP (community acquired pneumonia) -IV antibiotics as above PRN supportive treatment   Acute on chronic diastolic CHF (congestive heart failure) ([place]) -likely exacerbated due to her sepsis and pneumonia, she does not have excessive edema on x-ray, continue home dose diuretics   DM (diabetes mellitus), type 2 ([facility]) -sliding scale insulin with corresponding glucose checks   Essential hypertension -continue home meds   GERD (gastroesophageal reflux disease) -home dose PPI   Agree w/ above stated plan

## 2017-08-15 NOTE — H&P (Signed)
[facility] [place] NAME: [name]    MR#:  [id number]  DATE OF BIRTH:  [birth year]  DATE OF ADMISSION:  08/14/2017  PRIMARY CARE PHYSICIAN: [facility] [place]   REQUESTING/REFERRING PHYSICIAN: Mariea Clonts, MD  CHIEF COMPLAINT:   Chief Complaint  Patient presents with  . Shortness of Breath    HISTORY OF PRESENT ILLNESS:  [name]  is a 67 y.o. female who presents with shortness of breath for the past 3 days.  Patient states she had something of a cough as well.  Here she was found to have pneumonia and met sepsis criteria.  Hospitalist were called for admission  PAST MEDICAL HISTORY:   Past Medical History:  Diagnosis Date  . Asthma   . Cirrhosis, non-alcoholic ([place])   . COPD (chronic obstructive pulmonary disease) ([place])   . Deaf   . Depression   . Diabetes mellitus without complication ([place])   . GERD (gastroesophageal reflux disease)   . Heart murmur   . Hepatitis   . Hypertension   . Lymph node disorder    arm  . Neuropathy   . On home oxygen therapy    hs  . Orthopnea   . RLS (restless legs syndrome)   . Seizures ([place])   . Shortness of breath dyspnea   . Sleep apnea   . Stroke [facility])    tia    PAST SURGICAL HISTORY:   Past Surgical History:  Procedure Laterality Date  . CATARACT EXTRACTION W/PHACO Right 11/23/2014   Procedure: CATARACT EXTRACTION PHACO AND INTRAOCULAR LENS PLACEMENT (IOC);  Surgeon: Lyla Glassing, MD;  Location: [facility] ORS;  Service: Ophthalmology;  Laterality: Right;  . CATARACT EXTRACTION W/PHACO Left 12/14/2014   Procedure: CATARACT EXTRACTION PHACO AND INTRAOCULAR LENS PLACEMENT (IOC);  Surgeon: Lyla Glassing, MD;  Location: [facility] ORS;  Service: Ophthalmology;  Laterality: Left;  US:01:16.6 AP:15.8 CDE:12.14  . CESAREAN SECTION    . CHOLECYSTECTOMY    . KNEE ARTHROPLASTY    . THUMB ARTHROSCOPY    . TONSILLECTOMY    . TYMPANOPLASTY     muliple    SOCIAL  HISTORY:   Social History   Tobacco Use  . Smoking status: Current Every Day Smoker    Packs/day: 0.50  . Smokeless tobacco: Never Used  Substance Use Topics  . Alcohol use: No    FAMILY HISTORY:   Family History  Problem Relation Age of Onset  . Lung cancer Mother   . CAD Father     DRUG ALLERGIES:   Allergies  Allergen Reactions  . Aspirin Itching  . Celebrex [Celecoxib] Itching    itching  . Ciprofloxacin Itching  . Codeine Itching  . Fosphenytoin Itching  . Levaquin [Levofloxacin In D5w] Itching  . Levofloxacin Itching  . Lovastatin Itching  . Penicillins     Documentation indicates severe reaction  Pt tolerated cephalosporin without adverse reaction 09/18   . Pravastatin Itching  . Sulfa Antibiotics Itching    MEDICATIONS AT HOME:   Prior to Admission medications   Medication Sig Start Date End Date Taking? Authorizing Provider  acidophilus (RISAQUAD) CAPS capsule Take 1 capsule by mouth daily. 06/27/17   Gladstone Lighter, MD  albuterol (PROVENTIL HFA;VENTOLIN HFA) 108 (90 BASE) MCG/ACT inhaler Inhale 2 puffs into the lungs every 4 (four) hours as needed for wheezing or shortness of breath.    [provider]  albuterol (PROVENTIL) (2.5 MG/3ML) 0.083% nebulizer solution Take 3 mLs (2.5  mg total) by nebulization every 4 (four) hours as needed for wheezing or shortness of breath. 07/17/16   Vaughan Basta, MD  citalopram (CELEXA) 20 MG tablet Take 1 tablet (20 mg total) by mouth daily. 07/17/16   Vaughan Basta, MD  clopidogrel (PLAVIX) 75 MG tablet Take 1 tablet (75 mg total) by mouth daily. 07/17/16   Vaughan Basta, MD  diltiazem (DILACOR XR) 180 MG 24 hr capsule Take 180 mg by mouth daily.    [provider]  diphenoxylate-atropine (LOMOTIL) 2.5-0.025 MG tablet Take 2 tablets by mouth 4 (four) times daily as needed for diarrhea or loose stools. 06/27/17   Gladstone Lighter, MD  feeding supplement, ENSURE ENLIVE,  (ENSURE ENLIVE) LIQD Take 237 mLs by mouth 2 (two) times daily between meals. 06/17/17   Vaughan Basta, MD  furosemide (LASIX) 40 MG tablet Take 0.5 tablets (20 mg total) by mouth daily. 06/27/17   Gladstone Lighter, MD  GLIPIZIDE XL 5 MG 24 hr tablet Take 1 tablet (5 mg total) by mouth daily. 05/07/17   Loletha Grayer, MD  lacosamide (VIMPAT) 200 MG TABS tablet Take 1 tablet (200 mg total) by mouth 2 (two) times daily. 05/07/17   Loletha Grayer, MD  magnesium oxide (MAG-OX) 400 (241.3 Mg) MG tablet Take 1 tablet (400 mg total) by mouth daily. 05/07/17   Loletha Grayer, MD  mometasone-formoterol (DULERA) 200-5 MCG/ACT AERO Inhale 2 puffs into the lungs 2 (two) times daily. 07/17/16   Vaughan Basta, MD  montelukast (SINGULAIR) 10 MG tablet Take 1 tablet by mouth at bedtime. 03/18/16   [provider]  nortriptyline (PAMELOR) 10 MG capsule Take 1 capsule (10 mg total) by mouth at bedtime. 08/10/17   Cameron Sprang, MD  omeprazole (PRILOSEC) 20 MG capsule Take 20 mg by mouth daily.    [provider]  OXcarbazepine (TRILEPTAL) 150 MG tablet Take 1 tablet (150 mg total) by mouth 2 (two) times daily. 06/17/17   Vaughan Basta, MD  potassium chloride (KLOR-CON) 20 MEQ packet Take 20 mEq by mouth daily. While taking lasix. Take an extra packet if having > 3 loose stools per day 06/27/17   Gladstone Lighter, MD  rOPINIRole (REQUIP) 0.25 MG tablet Take 1 tablet (0.25 mg total) by mouth 3 (three) times daily. 03/24/16   Oswald Hillock, MD  rosuvastatin (CRESTOR) 10 MG tablet Take 10 mg by mouth daily. 04/21/17   [provider]    REVIEW OF SYSTEMS:  Review of Systems  Constitutional: Positive for malaise/fatigue. Negative for chills, fever and weight loss.  HENT: Negative for ear pain, hearing loss and tinnitus.   Eyes: Negative for blurred vision, double vision, pain and redness.  Respiratory: Positive for cough and shortness of breath. Negative for  hemoptysis.   Cardiovascular: Negative for chest pain, palpitations, orthopnea and leg swelling.  Gastrointestinal: Negative for abdominal pain, constipation, diarrhea, nausea and vomiting.  Genitourinary: Negative for dysuria, frequency and hematuria.  Musculoskeletal: Negative for back pain, joint pain and neck pain.  Skin:       No acne, rash, or lesions  Neurological: Negative for dizziness, tremors, focal weakness and weakness.  Endo/Heme/Allergies: Negative for polydipsia. Does not bruise/bleed easily.  Psychiatric/Behavioral: Negative for depression. The patient is not nervous/anxious and does not have insomnia.      VITAL SIGNS:   Vitals:   08/14/17 2230 08/14/17 2330 08/15/17 0000 08/15/17 0030  BP: (!) 152/72 (!) 158/72 (!) 152/68 (!) 169/68  Pulse: (!) 59 (!) 57 (!) 58 Marland Kitchen)  58  Resp: (!) 24 (!) 22 (!) 23 (!) 21  Temp:      TempSrc:      SpO2: 96% 97% 95% 93%  Weight:      Height:       Wt Readings from Last 3 Encounters:  08/14/17 63.5 kg (140 lb)  08/10/17 63.5 kg (140 lb)  07/06/17 61.2 kg (135 lb)    PHYSICAL EXAMINATION:  Physical Exam  Vitals reviewed. Constitutional: She is oriented to person, place, and time. She appears well-developed and well-nourished. No distress.  HENT:  Head: Normocephalic and atraumatic.  Mouth/Throat: Oropharynx is clear and moist.  Eyes: Conjunctivae and EOM are normal. Pupils are equal, round, and reactive to light. No scleral icterus.  Neck: Normal range of motion. Neck supple. No JVD present. No thyromegaly present.  Cardiovascular: Normal rate, regular rhythm and intact distal pulses. Exam reveals no gallop and no friction rub.  No murmur heard. Respiratory: Effort normal. No respiratory distress. She has no wheezes. She has rales.  GI: Soft. Bowel sounds are normal. She exhibits no distension. There is no tenderness.  Musculoskeletal: Normal range of motion. She exhibits no edema.  No arthritis, no gout  Lymphadenopathy:     She has no cervical adenopathy.  Neurological: She is alert and oriented to person, place, and time. No cranial nerve deficit.  No dysarthria, no aphasia  Skin: Skin is warm and dry. No rash noted. No erythema.  Psychiatric: She has a normal mood and affect. Her behavior is normal. Judgment and thought content normal.    LABORATORY PANEL:   CBC Recent Labs  Lab 08/14/17 1622  WBC 6.9  HGB 15.0  HCT 44.3  PLT 195   ------------------------------------------------------------------------------------------------------------------  Chemistries  Recent Labs  Lab 08/14/17 1622  NA 143  K 2.7*  CL 103  CO2 28  GLUCOSE 138*  BUN 10  CREATININE 0.37*  CALCIUM 9.2  AST 31  ALT 17  ALKPHOS 172*  BILITOT 0.8   ------------------------------------------------------------------------------------------------------------------  Cardiac Enzymes Recent Labs  Lab 08/14/17 2005  TROPONINI 0.03*   ------------------------------------------------------------------------------------------------------------------  RADIOLOGY:  Dg Chest 2 View  Result Date: 08/14/2017 CLINICAL DATA:  67 year old female with shortness of breath nausea and vomiting. EXAM: CHEST  2 VIEW COMPARISON:  Chest radiographs 06/16/2017 and earlier. FINDINGS: Semi upright AP and lateral views of the chest. Continued small bilateral pleural effusions. Stable lung volumes. Stable pulmonary vascularity without acute edema. Stable cardiac size and mediastinal contours. Calcified aortic atherosclerosis. Visualized tracheal air column is within normal limits. No pneumothorax or acute pulmonary opacity. Osteopenia. No acute osseous abnormality identified. Negative visible bowel gas pattern. IMPRESSION: Stable chest since December with small bilateral pleural effusions. No new cardiopulmonary abnormality. Electronically Signed   By: Genevie Ann M.D.   On: 08/14/2017 17:00   Ct Angio Chest Pe W And/or Wo Contrast  Result Date:  08/14/2017 CLINICAL DATA:  Shortness of breath, nausea, vomiting, and diarrhea. Productive cough. EXAM: CT ANGIOGRAPHY CHEST WITH CONTRAST TECHNIQUE: Multidetector CT imaging of the chest was performed using the standard protocol during bolus administration of intravenous contrast. Multiplanar CT image reconstructions and MIPs were obtained to evaluate the vascular anatomy. CONTRAST:  35m ISOVUE-370 IOPAMIDOL (ISOVUE-370) INJECTION 76% COMPARISON:  Two-view chest x-ray 08/14/2017. CTA of the chest 04/05/2017. FINDINGS: Cardiovascular: The heart is mildly enlarged. Atherosclerotic calcifications are present. Atherosclerotic calcifications are present in the aorta including the arch and descending aorta. Pulmonary artery opacification is satisfactory. There are no focal filling defects to  suggest pulmonary embolism. Mediastinum/Nodes: Subcentimeter paratracheal lymph nodes are again noted. No significant hilar or mediastinal adenopathy is present. The esophagus is unremarkable. Lungs/Pleura: Scattered ground-glass attenuation is present throughout both lungs. Moderate pleural effusions are present. Left greater than right lower lobe airspace disease is present. No other significant consolidation is present. Upper Abdomen: Cholecystectomy is noted. Upper abdomen is otherwise unremarkable. Musculoskeletal: Exaggerated kyphosis is again noted. No focal lytic or blastic lesions are present. Review of the MIP images confirms the above findings. IMPRESSION: 1. No pulmonary embolus. 2. Moderate bilateral pleural effusions. 3. Left greater than right basilar airspace disease. This is greater than expected for atelectasis. Infection or aspiration is considered. 4. Scattered ground-glass attenuation bilaterally suggesting edema. 5. Mild cardiomegaly. 6.  Aortic Atherosclerosis (ICD10-I70.0). Electronically Signed   By: San Morelle M.D.   On: 08/14/2017 20:54    EKG:   Orders placed or performed during the hospital  encounter of 08/14/17  . ED EKG 12-Lead  . ED EKG 12-Lead  . EKG 12-Lead  . EKG 12-Lead    IMPRESSION AND PLAN:  Principal Problem:   Sepsis ([place]) -IV antibiotics, lactic acid was normal, blood pressure stable, cultures sent Active Problems:   CAP (community acquired pneumonia) -IV antibiotics as above PRN supportive treatment   Acute on chronic diastolic CHF (congestive heart failure) ([place]) -likely exacerbated due to her sepsis and pneumonia, she does not have excessive edema on x-ray, continue home dose diuretics   DM (diabetes mellitus), type 2 (HCC) -sliding scale insulin with corresponding glucose checks   Essential hypertension -continue home meds   GERD (gastroesophageal reflux disease) -home dose PPI  All the records are reviewed and case discussed with ED provider. Management plans discussed with the patient and/or family.  DVT PROPHYLAXIS: SubQ lovenox  GI PROPHYLAXIS: PPI  ADMISSION STATUS: Inpatient  CODE STATUS: Full Code Status History    Date Active Date Inactive Code Status Order ID Comments User Context   06/26/2017 00:34 06/27/2017 14:32 Full Code [id number]  Gorden Harms, MD Inpatient   06/15/2017 20:15 06/17/2017 16:27 Full Code [id number]  Fritzi Mandes, MD Inpatient   06/15/2017 18:47 06/15/2017 20:15 DNR [id number]  Fritzi Mandes, MD ED   05/04/2017 08:35 05/07/2017 18:32 DNR [id number]  Max Sane, MD Inpatient   04/29/2017 19:15 05/04/2017 08:35 Full Code [id number]  Angela Adam, RN Inpatient   03/25/2017 02:35 03/30/2017 19:07 Full Code [id number]  Harvie Bridge, DO Inpatient   02/11/2017 21:31 02/12/2017 16:36 Full Code [id number]  Demetrios Loll, MD Inpatient   07/11/2016 11:31 07/17/2016 15:08 Partial Code [id number]  Marijo Conception, RN Inpatient   07/11/2016 02:37 07/11/2016 11:31 Full Code [id number]  Harvie Bridge, DO Inpatient   03/22/2016 22:44 03/24/2016 19:12 Partial Code [id number]  Toy Baker, MD Inpatient   03/29/2015 11:30 03/31/2015 14:21  Partial Code [id number]  Loletha Grayer, MD ED    Advance Directive Documentation     Most Recent Value  Type of Advance Directive  Out of facility DNR (pink MOST or yellow form)  Pre-existing out of facility DNR order (yellow form or pink MOST form)  No data  "MOST" Form in Place?  No data      TOTAL TIME TAKING CARE OF THIS PATIENT: 45 minutes.   Michial Disney [place] 08/15/2017, 1:35 AM  [employer]  [phone]  CC: Primary care physician; [place]  Note:  This document was prepared using Systems analyst and may include unintentional dictation errors.

## 2017-08-15 NOTE — Progress Notes (Signed)
Patient cant tolerate potassium infusion at 100 mL/ hr, attempted to decrease infusion to 75 mL. Patient still unable to tolerate. Infusion restarted at 50 mL. Will continue to monitor.   Iran Sizer M

## 2017-08-15 NOTE — Progress Notes (Signed)
Pharmacy Antibiotic Note  [name] is a 67 y.o. female admitted on 08/14/2017 with CAP.  Pharmacy has been consulted for ceftriaxone dosing.  Plan: Ceftriaxone 1 gram q 24 hours ordered.  Height: _0  (160 cm) Weight: 140 lb (63.5 kg) IBW/kg (Calculated) : 52.4  Temp (24hrs), Avg:97.8 F (36.6 C), Min:97.8 F (36.6 C), Max:97.8 F (36.6 C)  Recent Labs  Lab 08/14/17 1622 08/14/17 1623 08/14/17 1808  WBC 6.9  --   --   CREATININE 0.37*  --   --   LATICACIDVEN  --  1.0 0.9    Estimated Creatinine Clearance: 62 mL/min (A) (by C-G formula based on SCr of 0.37 mg/dL (L)).    Allergies  Allergen Reactions  . Aspirin Itching  . Celebrex [Celecoxib] Itching    itching  . Ciprofloxacin Itching  . Codeine Itching  . Fosphenytoin Itching  . Levaquin [Levofloxacin In D5w] Itching  . Levofloxacin Itching  . Lovastatin Itching  . Penicillins     Documentation indicates severe reaction  Pt tolerated cephalosporin without adverse reaction 09/18   . Pravastatin Itching  . Sulfa Antibiotics Itching    Antimicrobials this admission: Vanc/Zosyn x1  >> ceftriaxone, azithromycin   >>   Dose adjustments this admission:   Microbiology results: 2/8 BCx: pending 2/8 UCx: pending       2/8 CT chest: L>R disease 2/8 UA: LE(+) NO2(-)  WBC 6-30  Thank you for allowing pharmacy to be a part of this patient's care.  Rosenda Geffrard S 08/15/2017 1:55 AM

## 2017-08-16 LAB — MRSA PCR SCREENING: MRSA by PCR: POSITIVE — AB

## 2017-08-16 LAB — GLUCOSE, CAPILLARY
GLUCOSE-CAPILLARY: 123 mg/dL — AB (ref 65–99)
GLUCOSE-CAPILLARY: 131 mg/dL — AB (ref 65–99)
GLUCOSE-CAPILLARY: 117 mg/dL — AB (ref 65–99)
Glucose-Capillary: 97 mg/dL (ref 65–99)

## 2017-08-16 LAB — URINE CULTURE: CULTURE: NO GROWTH

## 2017-08-16 MED ORDER — HYDRALAZINE HCL 20 MG/ML IJ SOLN
10.0000 mg | INTRAMUSCULAR | Status: DC | PRN
  Administered 2017-08-17: 10 mg via INTRAVENOUS
  Filled 2017-08-16: qty 1

## 2017-08-16 MED ORDER — LOSARTAN POTASSIUM 50 MG PO TABS
50.0000 mg | ORAL_TABLET | Freq: Every day | ORAL | Status: DC
  Administered 2017-08-16: 50 mg via ORAL
  Filled 2017-08-16: qty 1

## 2017-08-16 MED ORDER — POTASSIUM CHLORIDE 20 MEQ PO PACK
40.0000 meq | PACK | Freq: Two times a day (BID) | ORAL | Status: DC
  Administered 2017-08-16 (×2): 40 meq via ORAL
  Filled 2017-08-16 (×2): qty 2

## 2017-08-16 MED ORDER — FUROSEMIDE 10 MG/ML IJ SOLN
60.0000 mg | Freq: Once | INTRAMUSCULAR | Status: AC
  Administered 2017-08-16: 60 mg via INTRAVENOUS
  Filled 2017-08-16: qty 8

## 2017-08-16 MED ORDER — AZITHROMYCIN 250 MG PO TABS
500.0000 mg | ORAL_TABLET | Freq: Every day | ORAL | Status: DC
  Administered 2017-08-17: 500 mg via ORAL
  Filled 2017-08-16: qty 2

## 2017-08-16 MED ORDER — FUROSEMIDE 10 MG/ML IJ SOLN
40.0000 mg | Freq: Two times a day (BID) | INTRAMUSCULAR | Status: DC
  Administered 2017-08-16 – 2017-08-17 (×2): 40 mg via INTRAVENOUS
  Filled 2017-08-16 (×2): qty 4

## 2017-08-16 NOTE — Plan of Care (Signed)
  Progressing Clinical Measurements: Diagnostic test results will improve 08/16/2017 0216 - Progressing by Loran Senters, RN Signs and symptoms of infection will decrease 08/16/2017 0216 - Progressing by Loran Senters, RN Respiratory: Ability to maintain adequate ventilation will improve 08/16/2017 0216 - Progressing by Loran Senters, RN

## 2017-08-16 NOTE — Progress Notes (Signed)
Initial Nutrition Assessment  DOCUMENTATION CODES:   Not applicable  INTERVENTION:  Provide Carnation Instant Breakfast in whole milk TID with trays, each supplement provides 280 kcal and 13 grams of protein.  Encouraged adequate intake of calories and protein at meals. Discussed food sources that contain protein to include with meals.  NUTRITION DIAGNOSIS:   Inadequate oral intake related to decreased appetite as evidenced by per patient/family report.  GOAL:   Patient will meet greater than or equal to 90% of their needs  MONITOR:   PO intake, Supplement acceptance, Labs, Weight trends, I & O's  REASON FOR ASSESSMENT:   Malnutrition Screening Tool    ASSESSMENT:   67 year old female with PMHx of asthma, COPD, HTN, seizures, neuropathy, hx CVA, non-alcoholic cirrhosis, hepatitis, GERD, DM type 2 who is now admitted with acute sepsis, PNA.   Met with patient at bedside. RD brought in video interpreter as patient is deaf, but she refused and said she would prefer to read lips. Patient reports she has had a decreased appetite since October. She is eating 2 small meals per day now, which is less than usual. She typically has some meat with vegetables/sides. She is not sure what is causing her decreased appetite. Does endorse some occasional nausea and abdominal pain. Patient reports her appetite has picked up some here and she is eating better. Patient reports she cannot afford Ensure to drink at home. Discussed Medical illustrator, which she is interested in trying.  Her UBW was 170 lbs. Per chart she was 171.2 lbs on 05/07/2017. If current weight is accurate patient has lost approximately 23 lbs (13.5% body weight) over the past 3 months, which would be significant for time frame. However, unsure if current weight is accurate.  Meal Completion: 40-100%  Medications reviewed and include: Lasix 40 mg BID IV, Novolog 0-9 units TID, Novolog 0-5 units QHS, pantoprazole,  azithromycin, ceftriaxone.  Labs reviewed: CBG 97-154, Potassium 3.4.  Patient does not meet criteria for malnutrition at this time,b ut is at risk for malnutrition in setting of reported weight loss.  NUTRITION - FOCUSED PHYSICAL EXAM:    Most Recent Value  Orbital Region  No depletion  Upper Arm Region  Mild depletion  Thoracic and Lumbar Region  No depletion  Buccal Region  No depletion  Temple Region  Mild depletion  Clavicle Bone Region  No depletion  Clavicle and Acromion Bone Region  No depletion  Scapular Bone Region  No depletion  Dorsal Hand  Mild depletion  Patellar Region  No depletion  Anterior Thigh Region  No depletion  Posterior Calf Region  No depletion  Edema (RD Assessment)  -- [non-pitting]  Hair  Reviewed  Eyes  Reviewed  Mouth  Reviewed  Skin  Reviewed  Nails  Reviewed     Diet Order:  Diet heart healthy/carb modified Room service appropriate? Yes; Fluid consistency: Thin  EDUCATION NEEDS:   No education needs have been identified at this time  Skin:  Skin Assessment: Reviewed RN Assessment(scattered ecchymosis)  Last BM:  08/15/2017  Height:   Ht Readings from Last 1 Encounters:  08/14/17 5' 3" (1.6 m)    Weight:   Wt Readings from Last 1 Encounters:  08/16/17 148 lb (67.1 kg)    Ideal Body Weight:  52.3 kg  BMI:  Body mass index is 26.22 kg/m.  Estimated Nutritional Needs:   Kcal:  9373-4287 (25-28 kcal/kg)  Protein:  80-95 grams (1.2-1.4 grams/kg)  Fluid:  2 L/day (  30 mL/kg)  Willey Blade, MS, RD, LDN Office: [phone] Pager: [phone] After Hours/Weekend Pager: [phone]

## 2017-08-16 NOTE — Progress Notes (Signed)
[place] [facility] [place] NAME: [name]    MR#:  [id number]  DATE OF BIRTH:  [birth year]  SUBJECTIVE:  CHIEF COMPLAINT:   Chief Complaint  Patient presents with  . Shortness of Breath  Patient without complaint, feeling better, noted uncontrolled hypertension overnight and this morning-started on losartan as well as as needed hydralazine  REVIEW OF SYSTEMS:  CONSTITUTIONAL: No fever, fatigue or weakness.  EYES: No blurred or double vision.  EARS, NOSE, AND THROAT: No tinnitus or ear pain.  RESPIRATORY: No cough, shortness of breath, wheezing or hemoptysis.  CARDIOVASCULAR: No chest pain, orthopnea, edema.  GASTROINTESTINAL: No nausea, vomiting, diarrhea or abdominal pain.  GENITOURINARY: No dysuria, hematuria.  ENDOCRINE: No polyuria, nocturia,  HEMATOLOGY: No anemia, easy bruising or bleeding SKIN: No rash or lesion. MUSCULOSKELETAL: No joint pain or arthritis.   NEUROLOGIC: No tingling, numbness, weakness.  PSYCHIATRY: No anxiety or depression.   ROS  DRUG ALLERGIES:   Allergies  Allergen Reactions  . Aspirin Itching  . Celebrex [Celecoxib] Itching    itching  . Ciprofloxacin Itching  . Codeine Itching  . Fosphenytoin Itching  . Levaquin [Levofloxacin In D5w] Itching  . Levofloxacin Itching  . Lovastatin Itching  . Penicillins     Documentation indicates severe reaction  Pt tolerated cephalosporin without adverse reaction 09/18   . Pravastatin Itching  . Sulfa Antibiotics Itching    VITALS:  Blood pressure (!) 176/61, pulse (!) 58, temperature 98.2 F (36.8 C), temperature source Oral, resp. rate 18, height _0  (1.6 m), weight 67.1 kg (148 lb), SpO2 95 %.  PHYSICAL EXAMINATION:  GENERAL:  67 y.o.-year-old patient lying in the bed with no acute distress.  EYES: Pupils equal, round, reactive to light and accommodation. No scleral icterus. Extraocular muscles intact.  HEENT: Head atraumatic, normocephalic.  Oropharynx and nasopharynx clear.  NECK:  Supple, no jugular venous distention. No thyroid enlargement, no tenderness.  LUNGS: Normal breath sounds bilaterally, no wheezing, rales,rhonchi or crepitation. No use of accessory muscles of respiration.  CARDIOVASCULAR: S1, S2 normal. No murmurs, rubs, or gallops.  ABDOMEN: Soft, nontender, nondistended. Bowel sounds present. No organomegaly or mass.  EXTREMITIES: No pedal edema, cyanosis, or clubbing.  NEUROLOGIC: Cranial nerves II through XII are intact. Muscle strength 5/5 in all extremities. Sensation intact. Gait not checked.  PSYCHIATRIC: The patient is alert and oriented x 3.  SKIN: No obvious rash, lesion, or ulcer.   Physical Exam LABORATORY PANEL:   CBC Recent Labs  Lab 08/15/17 0424  WBC 4.3  HGB 13.3  HCT 41.3  PLT 198   ------------------------------------------------------------------------------------------------------------------  Chemistries  Recent Labs  Lab 08/14/17 1622 08/15/17 0424  NA 143 142  K 2.7* 3.4*  CL 103 105  CO2 28 25  GLUCOSE 138* 163*  BUN 10 9  CREATININE 0.37* 0.51  CALCIUM 9.2 8.6*  AST 31  --   ALT 17  --   ALKPHOS 172*  --   BILITOT 0.8  --    ------------------------------------------------------------------------------------------------------------------  Cardiac Enzymes Recent Labs  Lab 08/14/17 2005 08/15/17 0424  TROPONINI 0.03* <0.03   ------------------------------------------------------------------------------------------------------------------  RADIOLOGY:  Dg Chest 2 View  Result Date: 08/14/2017 CLINICAL DATA:  67 year old female with shortness of breath nausea and vomiting. EXAM: CHEST  2 VIEW COMPARISON:  Chest radiographs 06/16/2017 and earlier. FINDINGS: Semi upright AP and lateral views of the chest. Continued small bilateral pleural effusions. Stable lung volumes. Stable pulmonary vascularity without acute edema. Stable cardiac size  and mediastinal contours.  Calcified aortic atherosclerosis. Visualized tracheal air column is within normal limits. No pneumothorax or acute pulmonary opacity. Osteopenia. No acute osseous abnormality identified. Negative visible bowel gas pattern. IMPRESSION: Stable chest since December with small bilateral pleural effusions. No new cardiopulmonary abnormality. Electronically Signed   By: Genevie Ann M.D.   On: 08/14/2017 17:00   Ct Angio Chest Pe W And/or Wo Contrast  Result Date: 08/14/2017 CLINICAL DATA:  Shortness of breath, nausea, vomiting, and diarrhea. Productive cough. EXAM: CT ANGIOGRAPHY CHEST WITH CONTRAST TECHNIQUE: Multidetector CT imaging of the chest was performed using the standard protocol during bolus administration of intravenous contrast. Multiplanar CT image reconstructions and MIPs were obtained to evaluate the vascular anatomy. CONTRAST:  61m ISOVUE-370 IOPAMIDOL (ISOVUE-370) INJECTION 76% COMPARISON:  Two-view chest x-ray 08/14/2017. CTA of the chest 04/05/2017. FINDINGS: Cardiovascular: The heart is mildly enlarged. Atherosclerotic calcifications are present. Atherosclerotic calcifications are present in the aorta including the arch and descending aorta. Pulmonary artery opacification is satisfactory. There are no focal filling defects to suggest pulmonary embolism. Mediastinum/Nodes: Subcentimeter paratracheal lymph nodes are again noted. No significant hilar or mediastinal adenopathy is present. The esophagus is unremarkable. Lungs/Pleura: Scattered ground-glass attenuation is present throughout both lungs. Moderate pleural effusions are present. Left greater than right lower lobe airspace disease is present. No other significant consolidation is present. Upper Abdomen: Cholecystectomy is noted. Upper abdomen is otherwise unremarkable. Musculoskeletal: Exaggerated kyphosis is again noted. No focal lytic or blastic lesions are present. Review of the MIP images confirms the above findings. IMPRESSION: 1. No  pulmonary embolus. 2. Moderate bilateral pleural effusions. 3. Left greater than right basilar airspace disease. This is greater than expected for atelectasis. Infection or aspiration is considered. 4. Scattered ground-glass attenuation bilaterally suggesting edema. 5. Mild cardiomegaly. 6.  Aortic Atherosclerosis (ICD10-I70.0). Electronically Signed   By: CSan MorelleM.D.   On: 08/14/2017 20:54    ASSESSMENT AND PLAN:  1 acute sepsis Secondary to CAP Resolved  2 acuteCAP Resolving Continue empiric Rocephin/azithromycin, follow-up on cultures  3 acute on chronic hypoxic respiratory failure Exacerbated by above Continue supplemental oxygen with weaning as tolerated-on 2 L via nasal cannula chronically at night only   4 chronic benign essential hypertension  Currently uncontrolled  Start losartan, PRN hydralazine for systolic blood pressure greater than 160, IV Lasix twice daily for now, vitals per routine, make changes as per necessary   5 chronic diabetes mellitus type 2 Stable on current regiment  6 chronic GERD without esophagitis PPI daily  All the records are reviewed and case discussed with Care Management/Social Workerr. Management plans discussed with the patient, family and they are in agreement.  CODE STATUS: full  TOTAL TIME TAKING CARE OF THIS PATIENT: 35 minutes.     POSSIBLE D/C IN 1-2 DAYS, DEPENDING ON CLINICAL CONDITION.   MAvel PeaceSalary M.D on 08/16/2017   Between 7am to 6pm - Pager - [phone] After 6pm go to [email or web address] - password [email or web address]  Office  [phone] CC: Primary care physician; [facility] Note: This dictation was prepared with Dragon dictation along with smaller phrase technology. Any transcriptional errors that result from this process are unintentional.

## 2017-08-16 NOTE — Progress Notes (Signed)
PHARMACIST - PHYSICIAN COMMUNICATION  CONCERNING: Antibiotic IV to Oral Route Change Policy  RECOMMENDATION: This patient is receiving AZITHROMYCIN by the intravenous route.  Based on criteria approved by the Pharmacy and Therapeutics Committee, the antibiotic(s) is/are being converted to the equivalent oral dose form(s).   National shortage of IV Azithromycin *  DESCRIPTION: These criteria include:  Patient being treated for a respiratory tract infection, urinary tract infection, cellulitis or clostridium difficile associated diarrhea if on metronidazole  The patient is not neutropenic and does not exhibit a GI malabsorption state  The patient is eating (either orally or via tube) and/or has been taking other orally administered medications for a least 24 hours  The patient is improving clinically and has a Tmax < 100.5  If you have questions about this conversion, please contact the Pharmacy Department  _0   [phone] )  [facility] _1   [phone] )  [facility] _2   [phone] )  [facility] _3   [phone] )  [facility] _4   [phone] )  [place] PharmD Clinical Pharmacist 08/16/2017

## 2017-08-17 ENCOUNTER — Encounter: Payer: Self-pay | Admitting: Neurology

## 2017-08-17 LAB — BASIC METABOLIC PANEL
ANION GAP: 12 (ref 5–15)
BUN: 12 mg/dL (ref 6–20)
CALCIUM: 8.8 mg/dL — AB (ref 8.9–10.3)
CO2: 30 mmol/L (ref 22–32)
CREATININE: 0.57 mg/dL (ref 0.44–1.00)
Chloride: 100 mmol/L — ABNORMAL LOW (ref 101–111)
GLUCOSE: 114 mg/dL — AB (ref 65–99)
Potassium: 3.1 mmol/L — ABNORMAL LOW (ref 3.5–5.1)
Sodium: 142 mmol/L (ref 135–145)

## 2017-08-17 LAB — GLUCOSE, CAPILLARY
GLUCOSE-CAPILLARY: 135 mg/dL — AB (ref 65–99)
Glucose-Capillary: 109 mg/dL — ABNORMAL HIGH (ref 65–99)

## 2017-08-17 LAB — MAGNESIUM: Magnesium: 1.4 mg/dL — ABNORMAL LOW (ref 1.7–2.4)

## 2017-08-17 MED ORDER — AMLODIPINE BESYLATE 10 MG PO TABS
10.0000 mg | ORAL_TABLET | Freq: Every day | ORAL | Status: DC
  Administered 2017-08-17: 10 mg via ORAL
  Filled 2017-08-17: qty 1

## 2017-08-17 MED ORDER — AMLODIPINE BESYLATE 10 MG PO TABS: 10.0000 mg | ORAL_TABLET | Freq: Every day | ORAL | 0 refills | Status: DC

## 2017-08-17 MED ORDER — SODIUM CHLORIDE 0.9 % IV SOLN
1.0000 g | INTRAVENOUS | Status: DC
  Administered 2017-08-17: 1 g via INTRAVENOUS
  Filled 2017-08-17: qty 10

## 2017-08-17 MED ORDER — POTASSIUM CHLORIDE ER 20 MEQ PO TBCR: 20.0000 meq | EXTENDED_RELEASE_TABLET | Freq: Every day | ORAL | 0 refills | Status: DC

## 2017-08-17 MED ORDER — POTASSIUM CHLORIDE CRYS ER 20 MEQ PO TBCR: 40.0000 meq | EXTENDED_RELEASE_TABLET | Freq: Two times a day (BID) | ORAL | Status: DC

## 2017-08-17 MED ORDER — AZITHROMYCIN 250 MG PO TABS: ORAL_TABLET | ORAL | 0 refills | Status: DC

## 2017-08-17 MED ORDER — LOSARTAN POTASSIUM 100 MG PO TABS: 100.0000 mg | ORAL_TABLET | Freq: Every day | ORAL | 0 refills | Status: DC

## 2017-08-17 MED ORDER — MAGNESIUM SULFATE 2 GM/50ML IV SOLN
2.0000 g | Freq: Once | INTRAVENOUS | Status: AC
  Administered 2017-08-17: 2 g via INTRAVENOUS
  Filled 2017-08-17: qty 50

## 2017-08-17 MED ORDER — POTASSIUM CHLORIDE CRYS ER 20 MEQ PO TBCR
60.0000 meq | EXTENDED_RELEASE_TABLET | Freq: Once | ORAL | Status: AC
  Administered 2017-08-17: 60 meq via ORAL
  Filled 2017-08-17: qty 3

## 2017-08-17 MED ORDER — LOSARTAN POTASSIUM 50 MG PO TABS
100.0000 mg | ORAL_TABLET | Freq: Every day | ORAL | Status: DC
  Administered 2017-08-17: 100 mg via ORAL
  Filled 2017-08-17: qty 2

## 2017-08-17 NOTE — Progress Notes (Signed)
Went over discharge instructions with the patient and son including medications and follow up appointment. Discontinue peripheral IV and telemetry monitor. Volunteer to transport patient.

## 2017-08-17 NOTE — Care Management Important Message (Signed)
Important Message  Patient Details  Name: KHLOE HUNKELE MRN: [id number] Date of Birth: 21-Jun-1951   Medicare Important Message Given:  Yes Patient is on isolation so unable to take signed copy out of room.  No notice entered in Epic on admission.  Provided and explained notice.  Patient verbalizes agreement with discharge and does not wish to appeal.   Katrina Stack, RN 08/17/2017, 1:08 PM

## 2017-08-17 NOTE — Discharge Summary (Signed)
[place] [facility] [place] NAME: [name]    MR#:  [id number]  DATE OF BIRTH:  September 03, 1950  DATE OF ADMISSION:  08/14/2017 ADMITTING PHYSICIAN: Lance Coon, MD  DATE OF DISCHARGE: No discharge date for patient encounter.  PRIMARY CARE PHYSICIAN: [facility], [place]    ADMISSION DIAGNOSIS:  Hypoxia [R09.02] Elevated troponin [R74.8] Community acquired pneumonia, unspecified laterality [J18.9] Acute on chronic congestive heart failure, unspecified heart failure type ([place]) [I50.9]  DISCHARGE DIAGNOSIS:  Principal Problem:   Sepsis ([place]) Active Problems:   DM (diabetes mellitus), type 2 ([place])   Essential hypertension   GERD (gastroesophageal reflux disease)   CAP (community acquired pneumonia)   Acute on chronic diastolic CHF (congestive heart failure) ([place])   SECONDARY DIAGNOSIS:   Past Medical History:  Diagnosis Date  . Asthma   . Cirrhosis, non-alcoholic ([place])   . COPD (chronic obstructive pulmonary disease) ([place])   . Deaf   . Depression   . Diabetes mellitus without complication ([place])   . GERD (gastroesophageal reflux disease)   . Heart murmur   . Hepatitis   . Hypertension   . Lymph node disorder    arm  . Neuropathy   . On home oxygen therapy    hs  . Orthopnea   . RLS (restless legs syndrome)   . Seizures ([place])   . Shortness of breath dyspnea   . Sleep apnea   . Stroke [facility])    [place]:  1 acute sepsis Secondary to CAP Resolved  2 acuteCAP Resolving Treated with empiric Rocephin/azithromycin while in house and patient did well  3 acute on chronic hypoxic respiratory failure Exacerbated by above Resolved Patient on 2 L via nasal cannula chronically at night which will be continued  4 chronic benign essential hypertension  Elevated while in house Patient started on losartan, Norvasc, increase p.o. Lasix, to follow-up with primary care provider status post  discharge in 3-5 days for reevaluation    5 chronic diabetes mellitus type 2 Stable on current regiment  6 chronic GERD without esophagitis PPI daily DISCHARGE CONDITIONS:  On day of discharge patient is afebrile, hemodynamic stable, tolerating diet, ready for discharge home, appropriate follow-up with primary care provider 3-5 days, for more specific details please see chart  CONSULTS OBTAINED:    DRUG ALLERGIES:   Allergies  Allergen Reactions  . Aspirin Itching  . Celebrex [Celecoxib] Itching    itching  . Ciprofloxacin Itching  . Codeine Itching  . Fosphenytoin Itching  . Levaquin [Levofloxacin In D5w] Itching  . Levofloxacin Itching  . Lovastatin Itching  . Penicillins     Documentation indicates severe reaction  Pt tolerated cephalosporin without adverse reaction 09/18   . Pravastatin Itching  . Sulfa Antibiotics Itching    DISCHARGE MEDICATIONS:   Allergies as of 08/17/2017      Reactions   Aspirin Itching   Celebrex [celecoxib] Itching   itching   Ciprofloxacin Itching   Codeine Itching   Fosphenytoin Itching   Levaquin [levofloxacin In D5w] Itching   Levofloxacin Itching   Lovastatin Itching   Penicillins    Documentation indicates severe reaction Pt tolerated cephalosporin without adverse reaction 09/18   Pravastatin Itching   Sulfa Antibiotics Itching      Medication List    STOP taking these medications   furosemide 40 MG tablet Commonly known as:  LASIX   potassium chloride 20 MEQ packet Commonly known as:  KLOR-CON Replaced by:  Potassium Chloride ER 20 MEQ Tbcr     TAKE these medications   acidophilus Caps capsule Take 1 capsule by mouth daily.   albuterol 108 (90 Base) MCG/ACT inhaler Commonly known as:  PROVENTIL HFA;VENTOLIN HFA Inhale 2 puffs into the lungs every 4 (four) hours as needed for wheezing or shortness of breath.   albuterol (2.5 MG/3ML) 0.083% nebulizer solution Commonly known as:  PROVENTIL Take 3 mLs (2.5 mg  total) by nebulization every 4 (four) hours as needed for wheezing or shortness of breath.   amLODipine 10 MG tablet Commonly known as:  NORVASC Take 1 tablet (10 mg total) by mouth daily. Start taking on:  08/18/2017   azithromycin 250 MG tablet Commonly known as:  ZITHROMAX 1 po daily Start taking on:  08/18/2017   citalopram 20 MG tablet Commonly known as:  CELEXA Take 1 tablet (20 mg total) by mouth daily.   clopidogrel 75 MG tablet Commonly known as:  PLAVIX Take 1 tablet (75 mg total) by mouth daily.   diltiazem 180 MG 24 hr capsule Commonly known as:  DILACOR XR Take 180 mg by mouth daily.   diphenoxylate-atropine 2.5-0.025 MG tablet Commonly known as:  LOMOTIL Take 2 tablets by mouth 4 (four) times daily as needed for diarrhea or loose stools.   feeding supplement (ENSURE ENLIVE) Liqd Take 237 mLs by mouth 2 (two) times daily between meals.   GLIPIZIDE XL 5 MG 24 hr tablet Generic drug:  glipiZIDE Take 1 tablet (5 mg total) by mouth daily.   lacosamide 200 MG Tabs tablet Commonly known as:  VIMPAT Take 1 tablet (200 mg total) by mouth 2 (two) times daily.   losartan 100 MG tablet Commonly known as:  COZAAR Take 1 tablet (100 mg total) by mouth daily. Start taking on:  08/18/2017   magnesium oxide 400 (241.3 Mg) MG tablet Commonly known as:  MAG-OX Take 1 tablet (400 mg total) by mouth daily.   mometasone-formoterol 200-5 MCG/ACT Aero Commonly known as:  DULERA Inhale 2 puffs into the lungs 2 (two) times daily.   montelukast 10 MG tablet Commonly known as:  SINGULAIR Take 1 tablet by mouth at bedtime.   nortriptyline 10 MG capsule Commonly known as:  PAMELOR Take 1 capsule (10 mg total) by mouth at bedtime.   omeprazole 20 MG capsule Commonly known as:  PRILOSEC Take 20 mg by mouth daily.   OXcarbazepine 150 MG tablet Commonly known as:  TRILEPTAL Take 1 tablet (150 mg total) by mouth 2 (two) times daily.   Potassium Chloride ER 20 MEQ  Tbcr Take 20 mEq by mouth daily. 2 po daily Replaces:  potassium chloride 20 MEQ packet   rOPINIRole 0.25 MG tablet Commonly known as:  REQUIP Take 1 tablet (0.25 mg total) by mouth 3 (three) times daily.   rosuvastatin 10 MG tablet Commonly known as:  CRESTOR Take 10 mg by mouth daily.        DISCHARGE INSTRUCTIONS:   If you experience worsening of your admission symptoms, develop shortness of breath, life threatening emergency, suicidal or homicidal thoughts you must seek medical attention immediately by calling 911 or calling your MD immediately  if symptoms less severe.  You Must read complete instructions/literature along with all the possible adverse reactions/side effects for all the Medicines you take and that have been prescribed to you. Take any new Medicines after you have completely understood and accept all the possible adverse reactions/side effects.   Please note  You were cared for by a hospitalist during your hospital stay. If you have any questions about your discharge medications or the care you received while you were in the hospital after you are discharged, you can call the unit and asked to speak with the hospitalist on call if the hospitalist that took care of you is not available. Once you are discharged, your primary care physician will handle any further medical issues. Please note that NO REFILLS for any discharge medications will be authorized once you are discharged, as it is imperative that you return to your primary care physician (or establish a relationship with a primary care physician if you do not have one) for your aftercare needs so that they can reassess your need for medications and monitor your lab values.    Today   CHIEF COMPLAINT:   Chief Complaint  Patient presents with  . Shortness of Breath    HISTORY OF PRESENT ILLNESS:  67 y.o. female who presents with shortness of breath for the past 3 days.  Patient states she had something of  a cough as well.  Here she was found to have pneumonia and met sepsis criteria.  Hospitalist were called for admission   VITAL SIGNS:  Blood pressure (!) 179/51, pulse 67, temperature 98.3 F (36.8 C), temperature source Oral, resp. rate 16, height _0  (1.6 m), weight 67.1 kg (148 lb), SpO2 91 %.  I/O:    Intake/Output Summary (Last 24 hours) at 08/17/2017 1026 Last data filed at 08/17/2017 1000 Gross per 24 hour  Intake 360 ml  Output 2200 ml  Net -1840 ml    PHYSICAL EXAMINATION:  GENERAL:  67 y.o.-year-old patient lying in the bed with no acute distress.  EYES: Pupils equal, round, reactive to light and accommodation. No scleral icterus. Extraocular muscles intact.  HEENT: Head atraumatic, normocephalic. Oropharynx and nasopharynx clear.  NECK:  Supple, no jugular venous distention. No thyroid enlargement, no tenderness.  LUNGS: Normal breath sounds bilaterally, no wheezing, rales,rhonchi or crepitation. No use of accessory muscles of respiration.  CARDIOVASCULAR: S1, S2 normal. No murmurs, rubs, or gallops.  ABDOMEN: Soft, non-tender, non-distended. Bowel sounds present. No organomegaly or mass.  EXTREMITIES: No pedal edema, cyanosis, or clubbing.  NEUROLOGIC: Cranial nerves II through XII are intact. Muscle strength 5/5 in all extremities. Sensation intact. Gait not checked.  PSYCHIATRIC: The patient is alert and oriented x 3.  SKIN: No obvious rash, lesion, or ulcer.   DATA REVIEW:   CBC Recent Labs  Lab 08/15/17 0424  WBC 4.3  HGB 13.3  HCT 41.3  PLT 198    Chemistries  Recent Labs  Lab 08/14/17 1622  08/17/17 0558  NA 143   < > 142  K 2.7*   < > 3.1*  CL 103   < > 100*  CO2 28   < > 30  GLUCOSE 138*   < > 114*  BUN 10   < > 12  CREATININE 0.37*   < > 0.57  CALCIUM 9.2   < > 8.8*  MG  --   --  1.4*  AST 31  --   --   ALT 17  --   --   ALKPHOS 172*  --   --   BILITOT 0.8  --   --    < > = values in this interval not displayed.    Cardiac  Enzymes Recent Labs  Lab 08/15/17 0424  TROPONINI <0.03    Microbiology Results  Results for orders placed or performed during the hospital encounter of 08/14/17  Urine culture     Status: None   Collection Time: 08/14/17  4:22 PM  Result Value Ref Range Status   Specimen Description   Final    URINE, RANDOM Performed at [facility], [address] [place], [place] [address]    Special Requests   Final    NONE Performed at [facility], [address] [place], [place] [address]    Culture   Final    NO GROWTH Performed at [facility], [place] [address], [place], [place] [address]    Report Status 08/16/2017 FINAL  Final  Blood Culture (routine x 2)     Status: None (Preliminary result)   Collection Time: 08/14/17  4:23 PM  Result Value Ref Range Status   Specimen Description BLOOD Blood Culture adequate volume  Final   Special Requests RIGHT ANTECUBITAL  Final   Culture   Final    NO GROWTH 3 DAYS Performed at [facility], [address] [place], [place] [address]    Report Status PENDING  Incomplete  Blood Culture (routine x 2)     Status: None (Preliminary result)   Collection Time: 08/14/17  4:25 PM  Result Value Ref Range Status   Specimen Description BLOOD BLOOD RIGHT ARM  Final   Special Requests   Final    BOTTLES DRAWN AEROBIC AND ANAEROBIC Blood Culture adequate volume   Culture   Final    NO GROWTH 3 DAYS Performed at [facility], [address] [place], [place] [address]    Report Status PENDING  Incomplete  MRSA PCR Screening     Status: Abnormal   Collection Time: 08/16/17 12:15 PM  Result Value Ref Range Status   MRSA by PCR POSITIVE (A) NEGATIVE Final    Comment:        The GeneXpert MRSA Assay (FDA approved for NASAL specimens only), is one component of a comprehensive MRSA colonization surveillance program. It is not intended to diagnose MRSA infection nor to guide or monitor treatment  for MRSA infections. RESULT CALLED TO, READ BACK BY AND VERIFIED WITH: BERNICE ALEJO ON 08/16/17 AT 1445 [facility] Performed at [facility], [address] [place],  [address]     RADIOLOGY:  No results found.  EKG:   Orders placed or performed during the hospital encounter of 08/14/17  . ED EKG 12-Lead  . ED EKG 12-Lead  . EKG 12-Lead  . EKG 12-Lead      Management plans discussed with the patient, family and they are in agreement.  CODE STATUS:     Code Status Orders  (From admission, onward)        Start     Ordered   08/15/17 0325  Full code  Continuous     08/15/17 0325    Code Status History    Date Active Date Inactive Code Status Order ID Comments User Context   06/26/2017 00:34 06/27/2017 14:32 Full Code [id number]  Gorden Harms, MD Inpatient   06/15/2017 20:15 06/17/2017 16:27 Full Code [id number]  Fritzi Mandes, MD Inpatient   06/15/2017 18:47 06/15/2017 20:15 DNR [id number]  Fritzi Mandes, MD ED   05/04/2017 08:35 05/07/2017 18:32 DNR [id number]  Max Sane, MD Inpatient   04/29/2017 19:15 05/04/2017 08:35 Full Code [id number]  Angela Adam, RN Inpatient   03/25/2017 02:35 03/30/2017 19:07 Full Code [id number]  Hugelmeyer, Ubaldo Glassing, DO Inpatient  02/11/2017 21:31 02/12/2017 16:36 Full Code [id number]  Demetrios Loll, MD Inpatient   07/11/2016 11:31 07/17/2016 15:08 Partial Code [id number]  Marijo Conception, RN Inpatient   07/11/2016 02:37 07/11/2016 11:31 Full Code [id number]  Harvie Bridge, DO Inpatient   03/22/2016 22:44 03/24/2016 19:12 Partial Code [id number]  Toy Baker, MD Inpatient   03/29/2015 11:30 03/31/2015 14:21 Partial Code [id number]  Loletha Grayer, MD ED    Advance Directive Documentation     Most Recent Value  Type of Advance Directive  Healthcare Power of Attorney  Pre-existing out of facility DNR order (yellow form or pink MOST form)  No data  "MOST" Form in Place?  No data      TOTAL TIME TAKING CARE OF THIS PATIENT: 45  minutes.    Avel Peace Niva Murren M.D on 08/17/2017 at 10:26 AM  Between 7am to 6pm - Pager - [phone]  After 6pm go to [email or web address] - password [email or web address] [facility]  Office  [phone]  CC: Primary care physician; [facility], [facility]   Note: This dictation was prepared with Dragon dictation along with smaller phrase technology. Any transcriptional errors that result from this process are unintentional.

## 2017-08-17 NOTE — Care Management (Signed)
Spoke with patient and her son.  Patient is moving into the home of her son on [address].  She has chronic oxygen through [facility] that she wears "as I need it."  Per son, patient has portable tanks for travel.  Her oxygen has been moved to son's home.  Patient and son deny the need for any home health follow up.  Patient has been followed by [facility] ([place]) and [facility] in the past but currently receiving no services in the home

## 2017-08-18 DIAGNOSIS — Z23 Encounter for immunization: Secondary | ICD-10-CM | POA: Diagnosis not present

## 2017-08-18 DIAGNOSIS — I10 Essential (primary) hypertension: Secondary | ICD-10-CM | POA: Diagnosis not present

## 2017-08-19 DIAGNOSIS — G40009 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 with seizures of localized onset, not intractable, without status epilepticus: Secondary | ICD-10-CM | POA: Diagnosis not present

## 2017-08-19 DIAGNOSIS — I10 Essential (primary) hypertension: Secondary | ICD-10-CM | POA: Diagnosis not present

## 2017-08-19 DIAGNOSIS — J449 Chronic obstructive pulmonary disease, unspecified: Secondary | ICD-10-CM | POA: Diagnosis not present

## 2017-08-19 DIAGNOSIS — R51 Headache: Secondary | ICD-10-CM | POA: Diagnosis not present

## 2017-08-19 DIAGNOSIS — E1140 Type 2 diabetes mellitus with diabetic neuropathy, unspecified: Secondary | ICD-10-CM | POA: Diagnosis not present

## 2017-08-19 DIAGNOSIS — H918X3 Other specified hearing loss, bilateral: Secondary | ICD-10-CM | POA: Diagnosis not present

## 2017-08-19 LAB — CULTURE, BLOOD (ROUTINE X 2)
CULTURE: NO GROWTH
Culture: NO GROWTH
SPECIAL REQUESTS: ADEQUATE
Specimen Description: ADEQUATE

## 2017-08-24 DIAGNOSIS — J449 Chronic obstructive pulmonary disease, unspecified: Secondary | ICD-10-CM | POA: Diagnosis not present

## 2017-08-24 DIAGNOSIS — R51 Headache: Secondary | ICD-10-CM | POA: Diagnosis not present

## 2017-08-24 DIAGNOSIS — G40009 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 with seizures of localized onset, not intractable, without status epilepticus: Secondary | ICD-10-CM | POA: Diagnosis not present

## 2017-08-24 DIAGNOSIS — E1140 Type 2 diabetes mellitus with diabetic neuropathy, unspecified: Secondary | ICD-10-CM | POA: Diagnosis not present

## 2017-08-24 DIAGNOSIS — I10 Essential (primary) hypertension: Secondary | ICD-10-CM | POA: Diagnosis not present

## 2017-08-24 DIAGNOSIS — H918X3 Other specified hearing loss, bilateral: Secondary | ICD-10-CM | POA: Diagnosis not present

## 2017-08-25 NOTE — Progress Notes (Signed)
Patient ID: [name], female    DOB: 15-Oct-1950, 67 y.o.   MRN: [id number]  HPI  [name] is a 67 y/o female with a history of asthma, DM, HTN, TIA, COPD, obstructive sleep apnea, seizures, neuropathy, non-alcoholic cirrhosis, GERD, DEAF (does know sign language), depression, current tobacco use and chronic heart failure.   Echo report from 07/13/16 reviewed and showed an EF of 50-55%.  Admitted 08/14/17 due to hypoxia due to sepsis due to pneumonia. Antibiotics were started, medications were adjusted and he was discharged after 3 days. Was in the ED 07/06/17 after a fall. Head CT showed possible maxillary fracture but further work-up deferred. Potassium level was low. Released from the ED. Admitted 06/25/17 due to hypokalemia due to diarrhea. Potassium replaced and she was discharged after 2 days.   She presents today for her initial visit with a chief complaint of minimal shortness of breath upon moderate exertion. She says that this has been present for several years with varying levels of severity. She has associated fatigue, dizziness, headaches and complete hearing loss. She denies any chest pain, edema, palpitations, abdominal distention, difficulty sleeping or weight gain. Says that she's having quite a bit of difficulty in swallowing the large 73mq potassium pills even when breaking or crushing them.   Past Medical History:  Diagnosis Date  . Asthma   . CHF (congestive heart failure) ([place]   . Cir[place]osis, non-alcoholic ([place]   . COPD (chronic obstructive pulmonary disease) ([place]   . Deaf   . Depression   . Diabetes mellitus without complication ([place]   . GERD (gastroesophageal reflux disease)   . Heart murmur   . Hepatitis   . Hypertension   . Lymph node disorder    arm  . Neuropathy   . On home oxygen therapy    hs  . Orthopnea   . RLS (restless legs syndrome)   . Seizures ([facility]   . Shortness of breath dyspnea   . Sleep apnea   . Stroke ([facility]    tia   Past  Surgical History:  Procedure Laterality Date  . CATARACT EXTRACTION W/PHACO Right 11/23/2014   Procedure: CATARACT EXTRACTION PHACO AND INTRAOCULAR LENS PLACEMENT (IOC);  Surgeon: NLyla Glassing MD;  Location: [facility] ORS;  Service: Ophthalmology;  Laterality: Right;  . CATARACT EXTRACTION W/PHACO Left 12/14/2014   Procedure: CATARACT EXTRACTION PHACO AND INTRAOCULAR LENS PLACEMENT (IOC);  Surgeon: NLyla Glassing MD;  Location: [facility] ORS;  Service: Ophthalmology;  Laterality: Left;  US:01:16.6 AP:15.8 CDE:12.14  . CESAREAN SECTION    . CHOLECYSTECTOMY    . KNEE ARTHROPLASTY    . THUMB ARTHROSCOPY    . TONSILLECTOMY    . TYMPANOPLASTY     muliple   Family History  Problem Relation Age of Onset  . Lung cancer Mother   . CAD Father    Social History   Tobacco Use  . Smoking status: Current Every Day Smoker    Packs/day: 0.50  . Smokeless tobacco: Never Used  Substance Use Topics  . Alcohol use: No   Allergies  Allergen Reactions  . Aspirin Itching  . Celebrex [Celecoxib] Itching    itching  . Ciprofloxacin Itching  . Codeine Itching  . Fosphenytoin Itching  . Levaquin [Levofloxacin In D5w] Itching  . Levofloxacin Itching  . Lovastatin Itching  . Penicillins     Documentation indicates severe reaction  Pt tolerated cephalosporin without adverse reaction 09/18   . Pravastatin Itching  . Sulfa Antibiotics Itching  Prior to Admission medications   Medication Sig Start Date End Date Taking? Authorizing Provider  acidophilus (RISAQUAD) CAPS capsule Take 1 capsule by mouth daily. 06/27/17  Yes Gladstone Lighter, MD  albuterol (PROVENTIL HFA;VENTOLIN HFA) 108 (90 BASE) MCG/ACT inhaler Inhale 2 puffs into the lungs every 4 (four) hours as needed for wheezing or shortness of breath.   Yes [provider]  albuterol (PROVENTIL) (2.5 MG/3ML) 0.083% nebulizer solution Take 3 mLs (2.5 mg total) by nebulization every 4 (four) hours as needed for wheezing or shortness of  breath. 07/17/16  Yes Vaughan Basta, MD  amLODipine (NORVASC) 10 MG tablet Take 1 tablet (10 mg total) by mouth daily. 08/18/17  Yes Salary, Avel Peace, MD  citalopram (CELEXA) 20 MG tablet Take 1 tablet (20 mg total) by mouth daily. 07/17/16  Yes Vaughan Basta, MD  clopidogrel (PLAVIX) 75 MG tablet Take 1 tablet (75 mg total) by mouth daily. 07/17/16  Yes Vaughan Basta, MD  diltiazem (DILACOR XR) 180 MG 24 hr capsule Take 180 mg by mouth daily.   Yes [provider]  diphenoxylate-atropine (LOMOTIL) 2.5-0.025 MG tablet Take 2 tablets by mouth 4 (four) times daily as needed for diarrhea or loose stools. 06/27/17  Yes Gladstone Lighter, MD  feeding supplement, ENSURE ENLIVE, (ENSURE ENLIVE) LIQD Take 237 mLs by mouth 2 (two) times daily between meals. 06/17/17  Yes Vaughan Basta, MD  GLIPIZIDE XL 5 MG 24 hr tablet Take 1 tablet (5 mg total) by mouth daily. 05/07/17  Yes Wieting, Richard, MD  lacosamide (VIMPAT) 200 MG TABS tablet Take 1 tablet (200 mg total) by mouth 2 (two) times daily. 05/07/17  Yes Wieting, Richard, MD  losartan (COZAAR) 100 MG tablet Take 1 tablet (100 mg total) by mouth daily. 08/18/17  Yes Salary, Avel Peace, MD  magnesium oxide (MAG-OX) 400 (241.3 Mg) MG tablet Take 1 tablet (400 mg total) by mouth daily. 05/07/17  Yes Wieting, Richard, MD  mometasone-formoterol [facility]) 200-5 MCG/ACT AERO Inhale 2 puffs into the lungs 2 (two) times daily. 07/17/16  Yes Vaughan Basta, MD  montelukast (SINGULAIR) 10 MG tablet Take 1 tablet by mouth at bedtime. 03/18/16  Yes [provider]  nortriptyline (PAMELOR) 10 MG capsule Take 1 capsule (10 mg total) by mouth at bedtime. 08/10/17  Yes Cameron Sprang, MD  omeprazole (PRILOSEC) 20 MG capsule Take 20 mg by mouth daily.   Yes [provider]  OXcarbazepine (TRILEPTAL) 150 MG tablet Take 1 tablet (150 mg total) by mouth 2 (two) times daily. 06/17/17  Yes Vaughan Basta, MD   potassium chloride 20 MEQ TBCR Take 20 mEq by mouth daily. 2 po daily 08/17/17  Yes Salary, Montell D, MD  rOPINIRole (REQUIP) 0.25 MG tablet Take 1 tablet (0.25 mg total) by mouth 3 (three) times daily. 03/24/16  Yes Oswald Hillock, MD  rosuvastatin (CRESTOR) 10 MG tablet Take 10 mg by mouth daily. 04/21/17  Yes [provider]    Review of Systems  Constitutional: Positive for fatigue. Negative for appetite change.  HENT: Negative for congestion, postnasal drip and sore throat.   Eyes: Negative.   Respiratory: Positive for shortness of breath (minimal). Negative for chest tightness and wheezing.   Cardiovascular: Negative for chest pain, palpitations and leg swelling.  Gastrointestinal: Negative for abdominal distention and abdominal pain.  Endocrine: Negative.   Genitourinary: Negative.   Musculoskeletal: Negative for back pain and myalgias.  Allergic/Immunologic: Negative.   Neurological: Positive for dizziness and headaches.  Hematological: Negative for  adenopathy. Does not bruise/bleed easily.  Psychiatric/Behavioral: Negative for dysphoric mood and sleep disturbance (wearing oxygen at 2L at night). The patient is not nervous/anxious.    Vitals:   08/26/17 0925  BP: (!) 156/74  Pulse: 80  Resp: 18  SpO2: 90%  Weight: 137 lb 6 oz (62.3 kg)  Height: 5' 3" (1.6 m)   Wt Readings from Last 3 Encounters:  08/26/17 137 lb 6 oz (62.3 kg)  08/16/17 148 lb (67.1 kg)  08/10/17 140 lb (63.5 kg)   Lab Results  Component Value Date   CREATININE 0.57 08/17/2017   CREATININE 0.51 08/15/2017   CREATININE 0.37 (L) 08/14/2017    Physical Exam  Constitutional: She is oriented to person, place, and time. She appears well-developed and well-nourished.  HENT:  Head: Normocephalic and atraumatic.  Neck: Normal range of motion. Neck supple. No JVD present.  Cardiovascular: Normal rate and regular rhythm.  Pulmonary/Chest: She has wheezes in the right upper field. She has rhonchi  in the right upper field, the right lower field and the left lower field. She has no rales.  Abdominal: Soft. She exhibits no distension. There is no tenderness.  Musculoskeletal: She exhibits no edema or tenderness.  Neurological: She is alert and oriented to person, place, and time.  Skin: Skin is warm and dry.  Psychiatric: She has a normal mood and affect. Her behavior is normal. Thought content normal.  Nursing note and vitals reviewed.  Assessment & Plan:  1: Chronic heart failure with preserved ejection fraction- - NYHA II - euvolemic today - weighing daily and she was instructed to call for an overnight weight gain of >2 pounds or a weekly weight gain of >5 pounds - not adding salt and son does the cooking but hasn't been reading food labels. Reviewed the importance of closely following a 2053m sodium diet and how to read food labels. Written dietary information was given to them about this as well - patient reports receiving her flu/pneumonia vaccines for this season - BNP 08/14/17 was 441.0  2: HTN- - BP mildly elevated but patient admits that she was a little nervous in coming to this appointment - continue to monitor - [facility] - BMP from 08/17/17 reviewed and showed sodium 142, potassium 3.1 and GFR >60  3: Seizure- - saw neurologist 08/10/17  4: Diabetes-  - nonfasting glucose at home was 145 - A1c on 05/05/17 was 7.4%  5: Tobacco- - currently smoking 1/2 ppd of cigarettes - complete cessation discussed for 3 minutes with her  Medication list was reviewed.  Return in 1 month or sooner for any questions/problems before then.

## 2017-08-26 ENCOUNTER — Encounter: Payer: Self-pay | Admitting: Family

## 2017-08-26 ENCOUNTER — Ambulatory Visit: Payer: Medicare HMO | Attending: Family | Admitting: Family

## 2017-08-26 VITALS — BP 156/74 | HR 80 | Resp 18 | Ht 63.0 in | Wt 137.4 lb

## 2017-08-26 DIAGNOSIS — Z7951 Long term (current) use of inhaled steroids: Secondary | ICD-10-CM | POA: Insufficient documentation

## 2017-08-26 DIAGNOSIS — E1140 Type 2 diabetes mellitus with diabetic neuropathy, unspecified: Secondary | ICD-10-CM | POA: Insufficient documentation

## 2017-08-26 DIAGNOSIS — Z8249 Family history of ischemic heart disease and other diseases of the circulatory system: Secondary | ICD-10-CM | POA: Insufficient documentation

## 2017-08-26 DIAGNOSIS — Z9842 Cataract extraction status, left eye: Secondary | ICD-10-CM | POA: Insufficient documentation

## 2017-08-26 DIAGNOSIS — Z9981 Dependence on supplemental oxygen: Secondary | ICD-10-CM | POA: Insufficient documentation

## 2017-08-26 DIAGNOSIS — Z9841 Cataract extraction status, right eye: Secondary | ICD-10-CM | POA: Insufficient documentation

## 2017-08-26 DIAGNOSIS — Z885 Allergy status to narcotic agent status: Secondary | ICD-10-CM | POA: Insufficient documentation

## 2017-08-26 DIAGNOSIS — Z888 Allergy status to other drugs, medicaments and biological substances status: Secondary | ICD-10-CM | POA: Diagnosis not present

## 2017-08-26 DIAGNOSIS — Z880 Allergy status to penicillin: Secondary | ICD-10-CM | POA: Diagnosis not present

## 2017-08-26 DIAGNOSIS — I5032 Chronic diastolic (congestive) heart failure: Secondary | ICD-10-CM | POA: Insufficient documentation

## 2017-08-26 DIAGNOSIS — Z79899 Other long term (current) drug therapy: Secondary | ICD-10-CM | POA: Insufficient documentation

## 2017-08-26 DIAGNOSIS — Z7902 Long term (current) use of antithrombotics/antiplatelets: Secondary | ICD-10-CM | POA: Insufficient documentation

## 2017-08-26 DIAGNOSIS — F17210 Nicotine dependence, cigarettes, uncomplicated: Secondary | ICD-10-CM | POA: Insufficient documentation

## 2017-08-26 DIAGNOSIS — J449 Chronic obstructive pulmonary disease, unspecified: Secondary | ICD-10-CM | POA: Insufficient documentation

## 2017-08-26 DIAGNOSIS — Z8673 Personal history of transient ischemic attack (TIA), and cerebral infarction without residual deficits: Secondary | ICD-10-CM | POA: Insufficient documentation

## 2017-08-26 DIAGNOSIS — Z85118 Personal history of other malignant neoplasm of bronchus and lung: Secondary | ICD-10-CM | POA: Insufficient documentation

## 2017-08-26 DIAGNOSIS — H9190 Unspecified hearing loss, unspecified ear: Secondary | ICD-10-CM | POA: Insufficient documentation

## 2017-08-26 DIAGNOSIS — R569 Unspecified convulsions: Secondary | ICD-10-CM

## 2017-08-26 DIAGNOSIS — Z886 Allergy status to analgesic agent status: Secondary | ICD-10-CM | POA: Diagnosis not present

## 2017-08-26 DIAGNOSIS — I110 Hypertensive heart disease with heart failure: Secondary | ICD-10-CM | POA: Insufficient documentation

## 2017-08-26 DIAGNOSIS — G2581 Restless legs syndrome: Secondary | ICD-10-CM | POA: Diagnosis not present

## 2017-08-26 DIAGNOSIS — Z720 Tobacco use: Secondary | ICD-10-CM

## 2017-08-26 DIAGNOSIS — Z881 Allergy status to other antibiotic agents status: Secondary | ICD-10-CM | POA: Diagnosis not present

## 2017-08-26 DIAGNOSIS — Z882 Allergy status to sulfonamides status: Secondary | ICD-10-CM | POA: Insufficient documentation

## 2017-08-26 DIAGNOSIS — Z961 Presence of intraocular lens: Secondary | ICD-10-CM | POA: Insufficient documentation

## 2017-08-26 DIAGNOSIS — I10 Essential (primary) hypertension: Secondary | ICD-10-CM

## 2017-08-26 DIAGNOSIS — K219 Gastro-esophageal reflux disease without esophagitis: Secondary | ICD-10-CM | POA: Insufficient documentation

## 2017-08-26 DIAGNOSIS — F329 Major depressive disorder, single episode, unspecified: Secondary | ICD-10-CM | POA: Diagnosis not present

## 2017-08-26 DIAGNOSIS — Z7984 Long term (current) use of oral hypoglycemic drugs: Secondary | ICD-10-CM | POA: Insufficient documentation

## 2017-08-26 DIAGNOSIS — E1142 Type 2 diabetes mellitus with diabetic polyneuropathy: Secondary | ICD-10-CM

## 2017-08-26 DIAGNOSIS — I509 Heart failure, unspecified: Secondary | ICD-10-CM | POA: Diagnosis present

## 2017-08-26 MED ORDER — POTASSIUM CHLORIDE ER 10 MEQ PO TBCR: 20.0000 meq | EXTENDED_RELEASE_TABLET | Freq: Two times a day (BID) | ORAL | 3 refills | Status: DC

## 2017-08-26 NOTE — Patient Instructions (Signed)
Continue weighing daily and call for an overnight weight gain of > 2 pounds or a weekly weight gain of >5 pounds.

## 2017-08-29 DIAGNOSIS — J449 Chronic obstructive pulmonary disease, unspecified: Secondary | ICD-10-CM | POA: Diagnosis not present

## 2017-08-29 DIAGNOSIS — H918X3 Other specified hearing loss, bilateral: Secondary | ICD-10-CM | POA: Diagnosis not present

## 2017-08-29 DIAGNOSIS — I10 Essential (primary) hypertension: Secondary | ICD-10-CM | POA: Diagnosis not present

## 2017-08-29 DIAGNOSIS — R51 Headache: Secondary | ICD-10-CM | POA: Diagnosis not present

## 2017-08-29 DIAGNOSIS — G40009 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 with seizures of localized onset, not intractable, without status epilepticus: Secondary | ICD-10-CM | POA: Diagnosis not present

## 2017-08-29 DIAGNOSIS — E1140 Type 2 diabetes mellitus with diabetic neuropathy, unspecified: Secondary | ICD-10-CM | POA: Diagnosis not present

## 2017-09-02 DIAGNOSIS — I10 Essential (primary) hypertension: Secondary | ICD-10-CM | POA: Diagnosis not present

## 2017-09-02 DIAGNOSIS — H918X3 Other specified hearing loss, bilateral: Secondary | ICD-10-CM | POA: Diagnosis not present

## 2017-09-02 DIAGNOSIS — G40009 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 with seizures of localized onset, not intractable, without status epilepticus: Secondary | ICD-10-CM | POA: Diagnosis not present

## 2017-09-02 DIAGNOSIS — R51 Headache: Secondary | ICD-10-CM | POA: Diagnosis not present

## 2017-09-02 DIAGNOSIS — J449 Chronic obstructive pulmonary disease, unspecified: Secondary | ICD-10-CM | POA: Diagnosis not present

## 2017-09-02 DIAGNOSIS — E1140 Type 2 diabetes mellitus with diabetic neuropathy, unspecified: Secondary | ICD-10-CM | POA: Diagnosis not present

## 2017-09-04 DIAGNOSIS — R51 Headache: Secondary | ICD-10-CM | POA: Diagnosis not present

## 2017-09-04 DIAGNOSIS — J4520 Mild intermittent asthma, uncomplicated: Secondary | ICD-10-CM | POA: Diagnosis not present

## 2017-09-04 DIAGNOSIS — E1140 Type 2 diabetes mellitus with diabetic neuropathy, unspecified: Secondary | ICD-10-CM | POA: Diagnosis not present

## 2017-09-04 DIAGNOSIS — J449 Chronic obstructive pulmonary disease, unspecified: Secondary | ICD-10-CM | POA: Diagnosis not present

## 2017-09-04 DIAGNOSIS — G40009 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 with seizures of localized onset, not intractable, without status epilepticus: Secondary | ICD-10-CM | POA: Diagnosis not present

## 2017-09-04 DIAGNOSIS — I6990 Unspecified sequelae of unspecified cerebrovascular disease: Secondary | ICD-10-CM | POA: Diagnosis not present

## 2017-09-04 DIAGNOSIS — K210 Gastro-esophageal reflux disease with esophagitis: Secondary | ICD-10-CM | POA: Diagnosis not present

## 2017-09-04 DIAGNOSIS — I2723 Pulmonary hypertension due to lung diseases and hypoxia: Secondary | ICD-10-CM | POA: Diagnosis not present

## 2017-09-04 DIAGNOSIS — I10 Essential (primary) hypertension: Secondary | ICD-10-CM | POA: Diagnosis not present

## 2017-09-04 DIAGNOSIS — H918X3 Other specified hearing loss, bilateral: Secondary | ICD-10-CM | POA: Diagnosis not present

## 2017-09-09 DIAGNOSIS — R51 Headache: Secondary | ICD-10-CM | POA: Diagnosis not present

## 2017-09-09 DIAGNOSIS — E1140 Type 2 diabetes mellitus with diabetic neuropathy, unspecified: Secondary | ICD-10-CM | POA: Diagnosis not present

## 2017-09-09 DIAGNOSIS — I10 Essential (primary) hypertension: Secondary | ICD-10-CM | POA: Diagnosis not present

## 2017-09-09 DIAGNOSIS — H918X3 Other specified hearing loss, bilateral: Secondary | ICD-10-CM | POA: Diagnosis not present

## 2017-09-09 DIAGNOSIS — J449 Chronic obstructive pulmonary disease, unspecified: Secondary | ICD-10-CM | POA: Diagnosis not present

## 2017-09-09 DIAGNOSIS — G40009 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 with seizures of localized onset, not intractable, without status epilepticus: Secondary | ICD-10-CM | POA: Diagnosis not present

## 2017-09-16 DIAGNOSIS — J449 Chronic obstructive pulmonary disease, unspecified: Secondary | ICD-10-CM | POA: Diagnosis not present

## 2017-09-16 DIAGNOSIS — E1140 Type 2 diabetes mellitus with diabetic neuropathy, unspecified: Secondary | ICD-10-CM | POA: Diagnosis not present

## 2017-09-16 DIAGNOSIS — R51 Headache: Secondary | ICD-10-CM | POA: Diagnosis not present

## 2017-09-16 DIAGNOSIS — I10 Essential (primary) hypertension: Secondary | ICD-10-CM | POA: Diagnosis not present

## 2017-09-16 DIAGNOSIS — H918X3 Other specified hearing loss, bilateral: Secondary | ICD-10-CM | POA: Diagnosis not present

## 2017-09-16 DIAGNOSIS — G40009 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 with seizures of localized onset, not intractable, without status epilepticus: Secondary | ICD-10-CM | POA: Diagnosis not present

## 2017-09-17 DIAGNOSIS — Z1389 Encounter for screening for other disorder: Secondary | ICD-10-CM | POA: Diagnosis not present

## 2017-09-17 DIAGNOSIS — F17209 Nicotine dependence, unspecified, with unspecified nicotine-induced disorders: Secondary | ICD-10-CM | POA: Diagnosis not present

## 2017-09-17 DIAGNOSIS — Z0000 Encounter for general adult medical examination without abnormal findings: Secondary | ICD-10-CM | POA: Diagnosis not present

## 2017-09-21 ENCOUNTER — Other Ambulatory Visit: Payer: Self-pay | Admitting: Family Medicine

## 2017-09-21 DIAGNOSIS — Z1231 Encounter for screening mammogram for malignant neoplasm of breast: Secondary | ICD-10-CM

## 2017-09-22 NOTE — Progress Notes (Signed)
Patient ID: [name], female    DOB: 09-Jun-1951, 67 y.o.   MRN: [id number]  HPI  [name] is a 67 y/o female with a history of asthma, DM, HTN, TIA, COPD, obstructive sleep apnea, seizures, neuropathy, non-alcoholic cirrhosis, GERD, DEAF (does know sign language), depression, current tobacco use and chronic heart failure.   Echo report from 07/13/16 reviewed and showed an EF of 50-55%.  Admitted 08/14/17 due to hypoxia due to sepsis due to pneumonia. Antibiotics were started, medications were adjusted and he was discharged after 3 days. Was in the ED 07/06/17 after a fall. Head CT showed possible maxillary fracture but further work-up deferred. Potassium level was low. Released from the ED. Admitted 06/25/17 due to hypokalemia due to diarrhea. Potassium replaced and she was discharged after 2 days.   She presents today for a follow-up visit with a chief complaint of moderate shortness of breath upon minimal exertion. She says that this has been chronic over several months although she does feel like it's worsening over the last few days. She has associated fatigue, cough, headaches, night sweats and bilateral leg pain along with this and the leg pain is interfering with her ability to sleep. She denies any abdominal distention, palpitations, edema, chest pain, wheezing, dizziness, fever or weight gain. Says that some family members are currently sick. Needs a new RX for her albuterol for her nebulizer.   Past Medical History:  Diagnosis Date  . Asthma   . CHF (congestive heart failure) ([place])   . Cirrhosis, non-alcoholic ([place])   . COPD (chronic obstructive pulmonary disease) ([place])   . Deaf   . Depression   . Diabetes mellitus without complication ([place])   . GERD (gastroesophageal reflux disease)   . Heart murmur   . Hepatitis   . Hypertension   . Lymph node disorder    arm  . Neuropathy   . On home oxygen therapy    hs  . Orthopnea   . RLS (restless legs syndrome)   . Seizures  ([place])   . Shortness of breath dyspnea   . Sleep apnea   . Stroke [facility])    tia   Past Surgical History:  Procedure Laterality Date  . CATARACT EXTRACTION W/PHACO Right 11/23/2014   Procedure: CATARACT EXTRACTION PHACO AND INTRAOCULAR LENS PLACEMENT (IOC);  Surgeon: Lyla Glassing, MD;  Location: [facility] ORS;  Service: Ophthalmology;  Laterality: Right;  . CATARACT EXTRACTION W/PHACO Left 12/14/2014   Procedure: CATARACT EXTRACTION PHACO AND INTRAOCULAR LENS PLACEMENT (IOC);  Surgeon: Lyla Glassing, MD;  Location: [facility] ORS;  Service: Ophthalmology;  Laterality: Left;  US:01:16.6 AP:15.8 CDE:12.14  . CESAREAN SECTION    . CHOLECYSTECTOMY    . KNEE ARTHROPLASTY    . THUMB ARTHROSCOPY    . TONSILLECTOMY    . TYMPANOPLASTY     muliple   Family History  Problem Relation Age of Onset  . Lung cancer Mother   . CAD Father    Social History   Tobacco Use  . Smoking status: Current Every Day Smoker    Packs/day: 0.50  . Smokeless tobacco: Never Used  Substance Use Topics  . Alcohol use: No   Allergies  Allergen Reactions  . Aspirin Itching  . Celebrex [Celecoxib] Itching    itching  . Ciprofloxacin Itching  . Codeine Itching  . Fosphenytoin Itching  . Levaquin [Levofloxacin In D5w] Itching  . Levofloxacin Itching  . Lovastatin Itching  . Penicillins     Documentation indicates severe  reaction  Pt tolerated cephalosporin without adverse reaction 09/18   . Pravastatin Itching  . Sulfa Antibiotics Itching   Prior to Admission medications   Medication Sig Start Date End Date Taking? Authorizing Provider  acidophilus (RISAQUAD) CAPS capsule Take 1 capsule by mouth daily. 06/27/17  Yes Gladstone Lighter, MD  albuterol (PROVENTIL HFA;VENTOLIN HFA) 108 (90 BASE) MCG/ACT inhaler Inhale 2 puffs into the lungs every 4 (four) hours as needed for wheezing or shortness of breath.   Yes [provider]  albuterol (PROVENTIL) (2.5 MG/3ML) 0.083% nebulizer solution Take 3 mLs  (2.5 mg total) by nebulization every 4 (four) hours as needed for wheezing or shortness of breath. 07/17/16  Yes Vaughan Basta, MD  amLODipine (NORVASC) 10 MG tablet Take 1 tablet (10 mg total) by mouth daily. 08/18/17  Yes Salary, Avel Peace, MD  citalopram (CELEXA) 20 MG tablet Take 1 tablet (20 mg total) by mouth daily. 07/17/16  Yes Vaughan Basta, MD  clopidogrel (PLAVIX) 75 MG tablet Take 1 tablet (75 mg total) by mouth daily. 07/17/16  Yes Vaughan Basta, MD  diltiazem (DILACOR XR) 180 MG 24 hr capsule Take 180 mg by mouth daily.   Yes [provider]  GLIPIZIDE XL 5 MG 24 hr tablet Take 1 tablet (5 mg total) by mouth daily. 05/07/17  Yes Wieting, Richard, MD  lacosamide (VIMPAT) 200 MG TABS tablet Take 1 tablet (200 mg total) by mouth 2 (two) times daily. 05/07/17  Yes Wieting, Richard, MD  losartan (COZAAR) 100 MG tablet Take 1 tablet (100 mg total) by mouth daily. 08/18/17  Yes Salary, Avel Peace, MD  mometasone-formoterol (DULERA) 200-5 MCG/ACT AERO Inhale 2 puffs into the lungs 2 (two) times daily. 07/17/16  Yes Vaughan Basta, MD  montelukast (SINGULAIR) 10 MG tablet Take 1 tablet by mouth at bedtime. 03/18/16  Yes [provider]  nortriptyline (PAMELOR) 10 MG capsule Take 1 capsule (10 mg total) by mouth at bedtime. 08/10/17  Yes Cameron Sprang, MD  omeprazole (PRILOSEC) 20 MG capsule Take 20 mg by mouth daily.   Yes [provider]  OXcarbazepine (TRILEPTAL) 150 MG tablet Take 1 tablet (150 mg total) by mouth 2 (two) times daily. 06/17/17  Yes Vaughan Basta, MD  potassium chloride (K-DUR) 10 MEQ tablet Take 2 tablets (20 mEq total) by mouth 2 (two) times daily. 08/26/17 11/24/17 Yes Hackney, Tina A, FNP  rOPINIRole (REQUIP) 0.25 MG tablet Take 1 tablet (0.25 mg total) by mouth 3 (three) times daily. 03/24/16  Yes Oswald Hillock, MD  rosuvastatin (CRESTOR) 10 MG tablet Take 10 mg by mouth daily. 04/21/17  Yes [provider]   diphenoxylate-atropine (LOMOTIL) 2.5-0.025 MG tablet Take 2 tablets by mouth 4 (four) times daily as needed for diarrhea or loose stools. Patient not taking: Reported on 09/23/2017 06/27/17   Gladstone Lighter, MD  feeding supplement, ENSURE ENLIVE, (ENSURE ENLIVE) LIQD Take 237 mLs by mouth 2 (two) times daily between meals. Patient not taking: Reported on 09/23/2017 06/17/17   Vaughan Basta, MD  magnesium oxide (MAG-OX) 400 (241.3 Mg) MG tablet Take 1 tablet (400 mg total) by mouth daily. Patient not taking: Reported on 09/23/2017 05/07/17   Loletha Grayer, MD    Review of Systems  Constitutional: Positive for fatigue. Negative for appetite change and fever.  HENT: Negative for congestion, postnasal drip and sore throat.   Eyes: Negative.   Respiratory: Positive for cough and shortness of breath. Negative for chest tightness and wheezing.   Cardiovascular: Negative for  chest pain, palpitations and leg swelling.  Gastrointestinal: Negative for abdominal distention and abdominal pain.  Endocrine:       Night sweats  Genitourinary: Negative.   Musculoskeletal: Positive for arthralgias (bilateral leg pain). Negative for back pain and myalgias.  Skin: Negative.   Allergic/Immunologic: Negative.   Neurological: Positive for headaches. Negative for dizziness.  Hematological: Negative for adenopathy. Does not bruise/bleed easily.  Psychiatric/Behavioral: Negative for dysphoric mood and sleep disturbance (wearing oxygen at 2L at night). The patient is not nervous/anxious.    Vitals:   09/23/17 0857  BP: (!) 139/52  Pulse: 66  Resp: 20  SpO2: 99%  Weight: 139 lb 8 oz (63.3 kg)  Height: _0  (1.6 m)   Wt Readings from Last 3 Encounters:  09/23/17 139 lb 8 oz (63.3 kg)  08/26/17 137 lb 6 oz (62.3 kg)  08/16/17 148 lb (67.1 kg)   Lab Results  Component Value Date   CREATININE 0.57 08/17/2017   CREATININE 0.51 08/15/2017   CREATININE 0.37 (L) 08/14/2017    Physical Exam   Constitutional: She is oriented to person, place, and time. She appears well-developed and well-nourished.  HENT:  Head: Normocephalic and atraumatic.  Neck: Normal range of motion. Neck supple. No JVD present.  Cardiovascular: Normal rate and regular rhythm.  Pulmonary/Chest: She has wheezes in the right upper field and the right lower field. She has rhonchi in the right lower field and the left lower field. She has no rales.  Abdominal: Soft. She exhibits no distension. There is no tenderness.  Musculoskeletal: She exhibits no edema or tenderness.  Neurological: She is alert and oriented to person, place, and time.  Skin: Skin is warm and dry.  Psychiatric: She has a normal mood and affect. Her behavior is normal. Thought content normal.  Nursing note and vitals reviewed.  Assessment & Plan:  1: Chronic heart failure with preserved ejection fraction- - NYHA III - euvolemic today - weighing daily and she was reminded to call for an overnight weight gain of >2 pounds or a weekly weight gain of >5 pounds - weight up 2 pounds since she was last here - not adding salt and son does the cooking and has been reading food labels. Reviewed the importance of closely following a 2077m sodium diet   - patient reports receiving her flu/pneumonia vaccines for this season - BNP 08/14/17 was 441.0 - PharmD reconciled medications with the patient  2: HTN- - BP better from last visit - [facility] - BMP from 08/17/17 reviewed and showed sodium 142, potassium 3.1 and GFR >60  3: Diabetes-  - nonfasting glucose at home was 135 - A1c on 05/05/17 was 7.4%  4: Bronchitis- - rhonchi and wheezing present - RX for Zpack & albuterol for her nebulizer given. Encouraged her to use the nebulizer 2-3 times day for right now  5: Tobacco- - currently smoking 3-4 cigarettes daily - complete cessation discussed for 3 minutes with  her  Encouraged her to call her PCP should the leg pain and night sweats continue.  Patient did not bring her medications nor a list. Each medication was verbally reviewed with the patient and she was encouraged to bring the bottles to every visit to confirm accuracy of list.  Entire visit done in the presence of the sign language interpreter  Return here in 3 months or sooner for any questions/problems before then.

## 2017-09-23 ENCOUNTER — Ambulatory Visit: Payer: Medicare HMO | Attending: Family | Admitting: Family

## 2017-09-23 ENCOUNTER — Encounter: Payer: Self-pay | Admitting: Family

## 2017-09-23 VITALS — BP 139/52 | HR 66 | Resp 20 | Ht 63.0 in | Wt 139.5 lb

## 2017-09-23 DIAGNOSIS — F329 Major depressive disorder, single episode, unspecified: Secondary | ICD-10-CM | POA: Insufficient documentation

## 2017-09-23 DIAGNOSIS — J440 Chronic obstructive pulmonary disease with acute lower respiratory infection: Secondary | ICD-10-CM | POA: Insufficient documentation

## 2017-09-23 DIAGNOSIS — H9190 Unspecified hearing loss, unspecified ear: Secondary | ICD-10-CM | POA: Diagnosis not present

## 2017-09-23 DIAGNOSIS — J40 Bronchitis, not specified as acute or chronic: Secondary | ICD-10-CM | POA: Diagnosis not present

## 2017-09-23 DIAGNOSIS — Z9889 Other specified postprocedural states: Secondary | ICD-10-CM | POA: Diagnosis not present

## 2017-09-23 DIAGNOSIS — Z801 Family history of malignant neoplasm of trachea, bronchus and lung: Secondary | ICD-10-CM | POA: Diagnosis not present

## 2017-09-23 DIAGNOSIS — F17210 Nicotine dependence, cigarettes, uncomplicated: Secondary | ICD-10-CM | POA: Diagnosis not present

## 2017-09-23 DIAGNOSIS — E1140 Type 2 diabetes mellitus with diabetic neuropathy, unspecified: Secondary | ICD-10-CM | POA: Insufficient documentation

## 2017-09-23 DIAGNOSIS — G40009 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 with seizures of localized onset, not intractable, without status epilepticus: Secondary | ICD-10-CM | POA: Diagnosis not present

## 2017-09-23 DIAGNOSIS — Z880 Allergy status to penicillin: Secondary | ICD-10-CM | POA: Diagnosis not present

## 2017-09-23 DIAGNOSIS — Z9049 Acquired absence of other specified parts of digestive tract: Secondary | ICD-10-CM | POA: Diagnosis not present

## 2017-09-23 DIAGNOSIS — I10 Essential (primary) hypertension: Secondary | ICD-10-CM

## 2017-09-23 DIAGNOSIS — Z8249 Family history of ischemic heart disease and other diseases of the circulatory system: Secondary | ICD-10-CM | POA: Diagnosis not present

## 2017-09-23 DIAGNOSIS — Z7902 Long term (current) use of antithrombotics/antiplatelets: Secondary | ICD-10-CM | POA: Insufficient documentation

## 2017-09-23 DIAGNOSIS — G2581 Restless legs syndrome: Secondary | ICD-10-CM | POA: Diagnosis not present

## 2017-09-23 DIAGNOSIS — K7460 Unspecified cirrhosis of liver: Secondary | ICD-10-CM | POA: Diagnosis not present

## 2017-09-23 DIAGNOSIS — Z885 Allergy status to narcotic agent status: Secondary | ICD-10-CM | POA: Insufficient documentation

## 2017-09-23 DIAGNOSIS — Z8673 Personal history of transient ischemic attack (TIA), and cerebral infarction without residual deficits: Secondary | ICD-10-CM | POA: Diagnosis not present

## 2017-09-23 DIAGNOSIS — Z886 Allergy status to analgesic agent status: Secondary | ICD-10-CM | POA: Insufficient documentation

## 2017-09-23 DIAGNOSIS — Z882 Allergy status to sulfonamides status: Secondary | ICD-10-CM | POA: Insufficient documentation

## 2017-09-23 DIAGNOSIS — I110 Hypertensive heart disease with heart failure: Secondary | ICD-10-CM | POA: Diagnosis not present

## 2017-09-23 DIAGNOSIS — Z9981 Dependence on supplemental oxygen: Secondary | ICD-10-CM | POA: Diagnosis not present

## 2017-09-23 DIAGNOSIS — K219 Gastro-esophageal reflux disease without esophagitis: Secondary | ICD-10-CM | POA: Insufficient documentation

## 2017-09-23 DIAGNOSIS — E1142 Type 2 diabetes mellitus with diabetic polyneuropathy: Secondary | ICD-10-CM

## 2017-09-23 DIAGNOSIS — R569 Unspecified convulsions: Secondary | ICD-10-CM | POA: Insufficient documentation

## 2017-09-23 DIAGNOSIS — H918X3 Other specified hearing loss, bilateral: Secondary | ICD-10-CM | POA: Diagnosis not present

## 2017-09-23 DIAGNOSIS — Z881 Allergy status to other antibiotic agents status: Secondary | ICD-10-CM | POA: Diagnosis not present

## 2017-09-23 DIAGNOSIS — Z79899 Other long term (current) drug therapy: Secondary | ICD-10-CM | POA: Insufficient documentation

## 2017-09-23 DIAGNOSIS — I5032 Chronic diastolic (congestive) heart failure: Secondary | ICD-10-CM | POA: Diagnosis not present

## 2017-09-23 DIAGNOSIS — R51 Headache: Secondary | ICD-10-CM | POA: Insufficient documentation

## 2017-09-23 DIAGNOSIS — G4733 Obstructive sleep apnea (adult) (pediatric): Secondary | ICD-10-CM | POA: Diagnosis not present

## 2017-09-23 DIAGNOSIS — J449 Chronic obstructive pulmonary disease, unspecified: Secondary | ICD-10-CM | POA: Diagnosis not present

## 2017-09-23 DIAGNOSIS — Z720 Tobacco use: Secondary | ICD-10-CM

## 2017-09-23 MED ORDER — ALBUTEROL SULFATE (2.5 MG/3ML) 0.083% IN NEBU: 2.5000 mg | INHALATION_SOLUTION | RESPIRATORY_TRACT | 5 refills | Status: DC | PRN

## 2017-09-23 MED ORDER — AZITHROMYCIN 250 MG PO TABS: ORAL_TABLET | ORAL | 0 refills | Status: DC

## 2017-09-23 NOTE — Patient Instructions (Signed)
Continue weighing daily and call for an overnight weight gain of > 2 pounds or a weekly weight gain of >5 pounds. 

## 2017-10-01 ENCOUNTER — Other Ambulatory Visit: Payer: Self-pay

## 2017-10-01 DIAGNOSIS — Z1211 Encounter for screening for malignant neoplasm of colon: Secondary | ICD-10-CM

## 2017-10-05 DIAGNOSIS — J449 Chronic obstructive pulmonary disease, unspecified: Secondary | ICD-10-CM | POA: Diagnosis not present

## 2017-10-05 DIAGNOSIS — I2723 Pulmonary hypertension due to lung diseases and hypoxia: Secondary | ICD-10-CM | POA: Diagnosis not present

## 2017-10-05 DIAGNOSIS — I6990 Unspecified sequelae of unspecified cerebrovascular disease: Secondary | ICD-10-CM | POA: Diagnosis not present

## 2017-10-05 DIAGNOSIS — J4520 Mild intermittent asthma, uncomplicated: Secondary | ICD-10-CM | POA: Diagnosis not present

## 2017-10-05 DIAGNOSIS — K210 Gastro-esophageal reflux disease with esophagitis: Secondary | ICD-10-CM | POA: Diagnosis not present

## 2017-10-22 ENCOUNTER — Encounter: Admission: RE | Payer: Self-pay | Source: Ambulatory Visit

## 2017-10-22 ENCOUNTER — Ambulatory Visit: Admission: RE | Admit: 2017-10-22 | Payer: Medicare HMO | Source: Ambulatory Visit | Admitting: Gastroenterology

## 2017-10-22 SURGERY — COLONOSCOPY WITH PROPOFOL
Anesthesia: General

## 2017-11-04 DIAGNOSIS — J4520 Mild intermittent asthma, uncomplicated: Secondary | ICD-10-CM | POA: Diagnosis not present

## 2017-11-04 DIAGNOSIS — I2723 Pulmonary hypertension due to lung diseases and hypoxia: Secondary | ICD-10-CM | POA: Diagnosis not present

## 2017-11-04 DIAGNOSIS — K210 Gastro-esophageal reflux disease with esophagitis: Secondary | ICD-10-CM | POA: Diagnosis not present

## 2017-11-04 DIAGNOSIS — I6990 Unspecified sequelae of unspecified cerebrovascular disease: Secondary | ICD-10-CM | POA: Diagnosis not present

## 2017-11-04 DIAGNOSIS — J449 Chronic obstructive pulmonary disease, unspecified: Secondary | ICD-10-CM | POA: Diagnosis not present

## 2017-11-24 DIAGNOSIS — M65332 Trigger finger, left middle finger: Secondary | ICD-10-CM | POA: Diagnosis not present

## 2017-11-24 DIAGNOSIS — M65322 Trigger finger, left index finger: Secondary | ICD-10-CM | POA: Diagnosis not present

## 2017-12-05 DIAGNOSIS — J449 Chronic obstructive pulmonary disease, unspecified: Secondary | ICD-10-CM | POA: Diagnosis not present

## 2017-12-05 DIAGNOSIS — I2723 Pulmonary hypertension due to lung diseases and hypoxia: Secondary | ICD-10-CM | POA: Diagnosis not present

## 2017-12-05 DIAGNOSIS — I6990 Unspecified sequelae of unspecified cerebrovascular disease: Secondary | ICD-10-CM | POA: Diagnosis not present

## 2017-12-05 DIAGNOSIS — K210 Gastro-esophageal reflux disease with esophagitis: Secondary | ICD-10-CM | POA: Diagnosis not present

## 2017-12-05 DIAGNOSIS — J4520 Mild intermittent asthma, uncomplicated: Secondary | ICD-10-CM | POA: Diagnosis not present

## 2017-12-23 DIAGNOSIS — E1140 Type 2 diabetes mellitus with diabetic neuropathy, unspecified: Secondary | ICD-10-CM | POA: Diagnosis not present

## 2017-12-23 DIAGNOSIS — E538 Deficiency of other specified B group vitamins: Secondary | ICD-10-CM | POA: Diagnosis not present

## 2017-12-23 DIAGNOSIS — K589 Irritable bowel syndrome without diarrhea: Secondary | ICD-10-CM | POA: Diagnosis not present

## 2017-12-23 DIAGNOSIS — R7309 Other abnormal glucose: Secondary | ICD-10-CM | POA: Diagnosis not present

## 2017-12-23 DIAGNOSIS — G2581 Restless legs syndrome: Secondary | ICD-10-CM | POA: Diagnosis not present

## 2017-12-23 DIAGNOSIS — E119 Type 2 diabetes mellitus without complications: Secondary | ICD-10-CM | POA: Diagnosis not present

## 2017-12-30 ENCOUNTER — Encounter: Payer: Self-pay | Admitting: Family

## 2017-12-30 ENCOUNTER — Ambulatory Visit: Payer: Medicare HMO | Attending: Family | Admitting: Family

## 2017-12-30 VITALS — BP 154/58 | HR 66 | Resp 18 | Wt 152.4 lb

## 2017-12-30 DIAGNOSIS — K219 Gastro-esophageal reflux disease without esophagitis: Secondary | ICD-10-CM | POA: Diagnosis not present

## 2017-12-30 DIAGNOSIS — Z961 Presence of intraocular lens: Secondary | ICD-10-CM | POA: Diagnosis not present

## 2017-12-30 DIAGNOSIS — E1140 Type 2 diabetes mellitus with diabetic neuropathy, unspecified: Secondary | ICD-10-CM | POA: Insufficient documentation

## 2017-12-30 DIAGNOSIS — Z720 Tobacco use: Secondary | ICD-10-CM

## 2017-12-30 DIAGNOSIS — Z8673 Personal history of transient ischemic attack (TIA), and cerebral infarction without residual deficits: Secondary | ICD-10-CM | POA: Diagnosis not present

## 2017-12-30 DIAGNOSIS — Z79899 Other long term (current) drug therapy: Secondary | ICD-10-CM | POA: Diagnosis not present

## 2017-12-30 DIAGNOSIS — Z881 Allergy status to other antibiotic agents status: Secondary | ICD-10-CM | POA: Insufficient documentation

## 2017-12-30 DIAGNOSIS — Z7902 Long term (current) use of antithrombotics/antiplatelets: Secondary | ICD-10-CM | POA: Insufficient documentation

## 2017-12-30 DIAGNOSIS — F329 Major depressive disorder, single episode, unspecified: Secondary | ICD-10-CM | POA: Insufficient documentation

## 2017-12-30 DIAGNOSIS — G4733 Obstructive sleep apnea (adult) (pediatric): Secondary | ICD-10-CM | POA: Insufficient documentation

## 2017-12-30 DIAGNOSIS — Z8249 Family history of ischemic heart disease and other diseases of the circulatory system: Secondary | ICD-10-CM | POA: Insufficient documentation

## 2017-12-30 DIAGNOSIS — Z880 Allergy status to penicillin: Secondary | ICD-10-CM | POA: Insufficient documentation

## 2017-12-30 DIAGNOSIS — I5032 Chronic diastolic (congestive) heart failure: Secondary | ICD-10-CM

## 2017-12-30 DIAGNOSIS — Z7951 Long term (current) use of inhaled steroids: Secondary | ICD-10-CM | POA: Insufficient documentation

## 2017-12-30 DIAGNOSIS — Z882 Allergy status to sulfonamides status: Secondary | ICD-10-CM | POA: Insufficient documentation

## 2017-12-30 DIAGNOSIS — Z801 Family history of malignant neoplasm of trachea, bronchus and lung: Secondary | ICD-10-CM | POA: Diagnosis not present

## 2017-12-30 DIAGNOSIS — J449 Chronic obstructive pulmonary disease, unspecified: Secondary | ICD-10-CM | POA: Diagnosis not present

## 2017-12-30 DIAGNOSIS — Z886 Allergy status to analgesic agent status: Secondary | ICD-10-CM | POA: Insufficient documentation

## 2017-12-30 DIAGNOSIS — R51 Headache: Secondary | ICD-10-CM | POA: Insufficient documentation

## 2017-12-30 DIAGNOSIS — G2581 Restless legs syndrome: Secondary | ICD-10-CM | POA: Diagnosis not present

## 2017-12-30 DIAGNOSIS — F17210 Nicotine dependence, cigarettes, uncomplicated: Secondary | ICD-10-CM | POA: Diagnosis not present

## 2017-12-30 DIAGNOSIS — Z7984 Long term (current) use of oral hypoglycemic drugs: Secondary | ICD-10-CM | POA: Insufficient documentation

## 2017-12-30 DIAGNOSIS — Z888 Allergy status to other drugs, medicaments and biological substances status: Secondary | ICD-10-CM | POA: Diagnosis not present

## 2017-12-30 DIAGNOSIS — Z9981 Dependence on supplemental oxygen: Secondary | ICD-10-CM | POA: Insufficient documentation

## 2017-12-30 DIAGNOSIS — Z885 Allergy status to narcotic agent status: Secondary | ICD-10-CM | POA: Diagnosis not present

## 2017-12-30 DIAGNOSIS — Z9842 Cataract extraction status, left eye: Secondary | ICD-10-CM | POA: Insufficient documentation

## 2017-12-30 DIAGNOSIS — Z9841 Cataract extraction status, right eye: Secondary | ICD-10-CM | POA: Insufficient documentation

## 2017-12-30 DIAGNOSIS — I10 Essential (primary) hypertension: Secondary | ICD-10-CM

## 2017-12-30 DIAGNOSIS — H9190 Unspecified hearing loss, unspecified ear: Secondary | ICD-10-CM | POA: Insufficient documentation

## 2017-12-30 DIAGNOSIS — I110 Hypertensive heart disease with heart failure: Secondary | ICD-10-CM | POA: Insufficient documentation

## 2017-12-30 DIAGNOSIS — I509 Heart failure, unspecified: Secondary | ICD-10-CM | POA: Diagnosis present

## 2017-12-30 DIAGNOSIS — E1142 Type 2 diabetes mellitus with diabetic polyneuropathy: Secondary | ICD-10-CM

## 2017-12-30 DIAGNOSIS — K7460 Unspecified cirrhosis of liver: Secondary | ICD-10-CM | POA: Insufficient documentation

## 2017-12-30 NOTE — Patient Instructions (Addendum)
Continue weighing daily and call for an overnight weight gain of > 2 pounds or a weekly weight gain of >5 pounds.  Bring medications to every visit

## 2017-12-30 NOTE — Progress Notes (Signed)
da  Patient ID: [name], female    DOB: [birth year], 67 y.o.   MRN: [id number]   Entire visit done in the presence of the sign language interpreter   HPI  [name] is a 67 y/o female with a history of asthma, DM, HTN, TIA, COPD, obstructive sleep apnea, seizures, neuropathy, non-alcoholic cirrhosis, GERD, DEAF (does know sign language), depression, current tobacco use and chronic heart failure.   Echo report from 07/13/16 reviewed and showed an EF of 50-55%.  Admitted 08/14/17 due to hypoxia due to sepsis due to pneumonia. Antibiotics were started, medications were adjusted and he was discharged after 3 days. Was in the ED 07/06/17 after a fall. Head CT showed possible maxillary fracture but further work-up deferred. Potassium level was low. Released from the ED. Admitted 06/25/17 due to hypokalemia due to diarrhea. Potassium replaced and she was discharged after 2 days.   She presents today for a follow-up visit with a chief complaint of moderate shortness of breath upon minimal exertion. She says that this has been present for several years. She has associated fatigue, cough, headaches, infrequent chest pain and gradual weight gain. She denies weight gain, abdominal distention, palpitations, pedal edema, dizziness or wheezing. Continues to cough up white sputum. Did not bring her medications and can't specify exactly what she's taking.   Past Medical History:  Diagnosis Date  . Asthma   . CHF (congestive heart failure) ([place])   . Cirrhosis, non-alcoholic ([place])   . COPD (chronic obstructive pulmonary disease) ([place])   . Deaf   . Depression   . Diabetes mellitus without complication ([place])   . GERD (gastroesophageal reflux disease)   . Heart murmur   . Hepatitis   . Hypertension   . Lymph node disorder    arm  . Neuropathy   . On home oxygen therapy    hs  . Orthopnea   . RLS (restless legs syndrome)   . Seizures ([place])   . Shortness of breath dyspnea   . Sleep apnea   . Stroke  [facility])    tia   Past Surgical History:  Procedure Laterality Date  . CATARACT EXTRACTION W/PHACO Right 11/23/2014   Procedure: CATARACT EXTRACTION PHACO AND INTRAOCULAR LENS PLACEMENT (IOC);  Surgeon: Lyla Glassing, MD;  Location: [facility] ORS;  Service: Ophthalmology;  Laterality: Right;  . CATARACT EXTRACTION W/PHACO Left 12/14/2014   Procedure: CATARACT EXTRACTION PHACO AND INTRAOCULAR LENS PLACEMENT (IOC);  Surgeon: Lyla Glassing, MD;  Location: [facility] ORS;  Service: Ophthalmology;  Laterality: Left;  US:01:16.6 AP:15.8 CDE:12.14  . CESAREAN SECTION    . CHOLECYSTECTOMY    . KNEE ARTHROPLASTY    . THUMB ARTHROSCOPY    . TONSILLECTOMY    . TYMPANOPLASTY     muliple   Family History  Problem Relation Age of Onset  . Lung cancer Mother   . CAD Father    Social History   Tobacco Use  . Smoking status: Current Every Day Smoker    Packs/day: 0.50  . Smokeless tobacco: Never Used  Substance Use Topics  . Alcohol use: No   Allergies  Allergen Reactions  . Aspirin Itching  . Celebrex [Celecoxib] Itching    itching  . Ciprofloxacin Itching  . Codeine Itching  . Fosphenytoin Itching  . Levaquin [Levofloxacin In D5w] Itching  . Levofloxacin Itching  . Lovastatin Itching  . Penicillins     Documentation indicates severe reaction  Pt tolerated cephalosporin without adverse reaction 09/18   . Pravastatin  Itching  . Sulfa Antibiotics Itching   Prior to Admission medications   Medication Sig Start Date End Date Taking? Authorizing Provider  acidophilus (RISAQUAD) CAPS capsule Take 1 capsule by mouth daily. 06/27/17  Yes Gladstone Lighter, MD  albuterol (PROVENTIL HFA;VENTOLIN HFA) 108 (90 BASE) MCG/ACT inhaler Inhale 2 puffs into the lungs every 4 (four) hours as needed for wheezing or shortness of breath.   Yes [provider]  albuterol (PROVENTIL) (2.5 MG/3ML) 0.083% nebulizer solution Take 3 mLs (2.5 mg total) by nebulization every 4 (four) hours as needed for  wheezing or shortness of breath. 09/23/17  Yes Darylene Price A, FNP  amLODipine (NORVASC) 10 MG tablet Take 1 tablet (10 mg total) by mouth daily. 08/18/17  Yes Salary, Avel Peace, MD  citalopram (CELEXA) 20 MG tablet Take 1 tablet (20 mg total) by mouth daily. 07/17/16  Yes Vaughan Basta, MD  clopidogrel (PLAVIX) 75 MG tablet Take 1 tablet (75 mg total) by mouth daily. 07/17/16  Yes Vaughan Basta, MD  diltiazem (DILACOR XR) 180 MG 24 hr capsule Take 180 mg by mouth daily.   Yes [provider]  furosemide (LASIX) 20 MG tablet Take 20 mg by mouth daily.   Yes [provider]  gabapentin (NEURONTIN) 300 MG capsule Take 600 mg by mouth daily.   Yes [provider]  GLIPIZIDE XL 5 MG 24 hr tablet Take 1 tablet (5 mg total) by mouth daily. 05/07/17  Yes Wieting, Richard, MD  lacosamide (VIMPAT) 200 MG TABS tablet Take 1 tablet (200 mg total) by mouth 2 (two) times daily. 05/07/17  Yes Wieting, Richard, MD  losartan (COZAAR) 100 MG tablet Take 1 tablet (100 mg total) by mouth daily. Patient taking differently: Take 25 mg by mouth daily.  08/18/17  Yes Salary, Avel Peace, MD  mometasone-formoterol (DULERA) 200-5 MCG/ACT AERO Inhale 2 puffs into the lungs 2 (two) times daily. 07/17/16  Yes Vaughan Basta, MD  montelukast (SINGULAIR) 10 MG tablet Take 1 tablet by mouth at bedtime. 03/18/16  Yes [provider]  omeprazole (PRILOSEC) 20 MG capsule Take 20 mg by mouth daily.   Yes [provider]  OXcarbazepine (TRILEPTAL) 150 MG tablet Take 1 tablet (150 mg total) by mouth 2 (two) times daily. 06/17/17  Yes Vaughan Basta, MD  potassium chloride (K-DUR) 10 MEQ tablet Take 2 tablets (20 mEq total) by mouth 2 (two) times daily. 08/26/17 12/30/17 Yes Khloie Hamada A, FNP  rOPINIRole (REQUIP) 0.25 MG tablet Take 1 tablet (0.25 mg total) by mouth 3 (three) times daily. 03/24/16  Yes Oswald Hillock, MD  rosuvastatin (CRESTOR) 10 MG tablet Take 10 mg  by mouth daily. 04/21/17  Yes [provider]  VITAMIN D, CHOLECALCIFEROL, PO Take 50,000 Units by mouth once a week.    Yes [provider]    Review of Systems  Constitutional: Positive for fatigue (minimal). Negative for appetite change and fever.  HENT: Negative for congestion, postnasal drip and sore throat.   Eyes: Negative.   Respiratory: Positive for cough and shortness of breath. Negative for chest tightness and wheezing.   Cardiovascular: Positive for chest pain (yesterday). Negative for palpitations and leg swelling.  Gastrointestinal: Negative for abdominal distention and abdominal pain.  Endocrine:       Night sweats  Genitourinary: Negative.   Musculoskeletal: Positive for arthralgias (bilateral leg pain). Negative for back pain and myalgias.  Skin: Negative.   Allergic/Immunologic: Negative.   Neurological: Positive for headaches. Negative for dizziness and  light-headedness.  Hematological: Negative for adenopathy. Does not bruise/bleed easily.  Psychiatric/Behavioral: Negative for dysphoric mood and sleep disturbance (wearing oxygen at 2L at night). The patient is not nervous/anxious.    Vitals:   12/30/17 0853  BP: (!) 154/58  Pulse: 66  Resp: 18  SpO2: 94%  Weight: 152 lb 6 oz (69.1 kg)   Wt Readings from Last 3 Encounters:  12/30/17 152 lb 6 oz (69.1 kg)  09/23/17 139 lb 8 oz (63.3 kg)  08/26/17 137 lb 6 oz (62.3 kg)   Lab Results  Component Value Date   CREATININE 0.57 08/17/2017   CREATININE 0.51 08/15/2017   CREATININE 0.37 (L) 08/14/2017    Physical Exam  Constitutional: She is oriented to person, place, and time. She appears well-developed and well-nourished.  HENT:  Head: Normocephalic and atraumatic.  Neck: Normal range of motion. Neck supple. No JVD present.  Cardiovascular: Normal rate and regular rhythm.  Pulmonary/Chest: She has no wheezes. She has rhonchi in the right lower field and the left lower field. She has no  rales.  Abdominal: Soft. She exhibits no distension. There is no tenderness.  Musculoskeletal: She exhibits no edema or tenderness.  Neurological: She is alert and oriented to person, place, and time.  Skin: Skin is warm and dry.  Psychiatric: She has a normal mood and affect. Her behavior is normal. Thought content normal.  Nursing note and vitals reviewed.  Assessment & Plan:  1: Chronic heart failure with preserved ejection fraction- - NYHA III - euvolemic today - weighing daily and she was reminded to call for an overnight weight gain of >2 pounds or a weekly weight gain of >5 pounds - weight up 13 pounds since she was last here 09/23/17; she says that her son likes to cook and eat so she's been eating more - not adding salt and son does the cooking and has been reading food labels. Reviewed the importance of closely following a 2087m sodium diet   - patient says that she's taking a fluid pill but she can't recall name / dosage of medication - BNP 08/14/17 was 441.0  2: HTN- - BP mildly elevated today - [facility] - she says that she checks her BP at home and that it's been "ok". Advised her to write the BP down and bring in for review at her next visit - BMP from 08/17/17 reviewed and showed sodium 142, potassium 3.1 and GFR >60  3: Diabetes-  - nonfasting glucose at home was 135 - A1c on 05/05/17 was 7.4%  4: Tobacco- - currently smoking 1-2 cigarettes daily; more if she's stressed (says that her son's fiance' drinks and then can get mean with her) - complete cessation discussed for 3 minutes with her  Patient did not bring her medications nor a list. Each medication was verbally reviewed with the patient and she was encouraged to bring the bottles to every visit to confirm accuracy of list.  Requested med list from patient's pharmacy but did not receive it until after patient had left. Updated med list in the  computer once it was received.  Could increase losartan should BP remain elevated.

## 2018-01-04 DIAGNOSIS — J449 Chronic obstructive pulmonary disease, unspecified: Secondary | ICD-10-CM | POA: Diagnosis not present

## 2018-01-04 DIAGNOSIS — I6990 Unspecified sequelae of unspecified cerebrovascular disease: Secondary | ICD-10-CM | POA: Diagnosis not present

## 2018-01-04 DIAGNOSIS — J4520 Mild intermittent asthma, uncomplicated: Secondary | ICD-10-CM | POA: Diagnosis not present

## 2018-01-04 DIAGNOSIS — I2723 Pulmonary hypertension due to lung diseases and hypoxia: Secondary | ICD-10-CM | POA: Diagnosis not present

## 2018-01-04 DIAGNOSIS — K210 Gastro-esophageal reflux disease with esophagitis: Secondary | ICD-10-CM | POA: Diagnosis not present

## 2018-01-27 ENCOUNTER — Ambulatory Visit: Payer: 59 | Admitting: Family

## 2018-03-01 ENCOUNTER — Encounter: Payer: Self-pay | Admitting: Emergency Medicine

## 2018-03-01 ENCOUNTER — Emergency Department: Payer: Medicare HMO

## 2018-03-01 ENCOUNTER — Inpatient Hospital Stay
Admission: EM | Admit: 2018-03-01 | Discharge: 2018-03-03 | DRG: 194 | Disposition: A | Discharge status: Home / Self Care | Payer: Medicare HMO | Attending: Internal Medicine | Admitting: Internal Medicine

## 2018-03-01 ENCOUNTER — Other Ambulatory Visit: Payer: Self-pay

## 2018-03-01 DIAGNOSIS — G4730 Sleep apnea, unspecified: Secondary | ICD-10-CM | POA: Diagnosis present

## 2018-03-01 DIAGNOSIS — J181 Lobar pneumonia, unspecified organism: Secondary | ICD-10-CM | POA: Diagnosis present

## 2018-03-01 DIAGNOSIS — Z7984 Long term (current) use of oral hypoglycemic drugs: Secondary | ICD-10-CM | POA: Diagnosis not present

## 2018-03-01 DIAGNOSIS — J440 Chronic obstructive pulmonary disease with acute lower respiratory infection: Secondary | ICD-10-CM | POA: Diagnosis present

## 2018-03-01 DIAGNOSIS — K7460 Unspecified cirrhosis of liver: Secondary | ICD-10-CM | POA: Diagnosis present

## 2018-03-01 DIAGNOSIS — G2581 Restless legs syndrome: Secondary | ICD-10-CM | POA: Diagnosis present

## 2018-03-01 DIAGNOSIS — Z7951 Long term (current) use of inhaled steroids: Secondary | ICD-10-CM | POA: Diagnosis not present

## 2018-03-01 DIAGNOSIS — Z8673 Personal history of transient ischemic attack (TIA), and cerebral infarction without residual deficits: Secondary | ICD-10-CM

## 2018-03-01 DIAGNOSIS — J441 Chronic obstructive pulmonary disease with (acute) exacerbation: Secondary | ICD-10-CM | POA: Diagnosis present

## 2018-03-01 DIAGNOSIS — A419 Sepsis, unspecified organism: Secondary | ICD-10-CM

## 2018-03-01 DIAGNOSIS — F17210 Nicotine dependence, cigarettes, uncomplicated: Secondary | ICD-10-CM | POA: Diagnosis present

## 2018-03-01 DIAGNOSIS — Z9981 Dependence on supplemental oxygen: Secondary | ICD-10-CM | POA: Diagnosis not present

## 2018-03-01 DIAGNOSIS — E1142 Type 2 diabetes mellitus with diabetic polyneuropathy: Secondary | ICD-10-CM | POA: Diagnosis present

## 2018-03-01 DIAGNOSIS — I110 Hypertensive heart disease with heart failure: Secondary | ICD-10-CM | POA: Diagnosis present

## 2018-03-01 DIAGNOSIS — I5032 Chronic diastolic (congestive) heart failure: Secondary | ICD-10-CM | POA: Diagnosis present

## 2018-03-01 DIAGNOSIS — Z79899 Other long term (current) drug therapy: Secondary | ICD-10-CM | POA: Diagnosis not present

## 2018-03-01 DIAGNOSIS — J189 Pneumonia, unspecified organism: Secondary | ICD-10-CM | POA: Diagnosis present

## 2018-03-01 DIAGNOSIS — K219 Gastro-esophageal reflux disease without esophagitis: Secondary | ICD-10-CM | POA: Diagnosis present

## 2018-03-01 DIAGNOSIS — R0602 Shortness of breath: Secondary | ICD-10-CM | POA: Diagnosis present

## 2018-03-01 DIAGNOSIS — E876 Hypokalemia: Secondary | ICD-10-CM | POA: Diagnosis present

## 2018-03-01 DIAGNOSIS — Z7902 Long term (current) use of antithrombotics/antiplatelets: Secondary | ICD-10-CM

## 2018-03-01 DIAGNOSIS — Z8249 Family history of ischemic heart disease and other diseases of the circulatory system: Secondary | ICD-10-CM

## 2018-03-01 LAB — LACTIC ACID, PLASMA
LACTIC ACID, VENOUS: 1.6 mmol/L (ref 0.5–1.9)
Lactic Acid, Venous: 1.6 mmol/L (ref 0.5–1.9)

## 2018-03-01 LAB — BASIC METABOLIC PANEL
ANION GAP: 11 (ref 5–15)
BUN: 18 mg/dL (ref 8–23)
CALCIUM: 9.2 mg/dL (ref 8.9–10.3)
CO2: 28 mmol/L (ref 22–32)
CREATININE: 0.64 mg/dL (ref 0.44–1.00)
Chloride: 101 mmol/L (ref 98–111)
Glucose, Bld: 107 mg/dL — ABNORMAL HIGH (ref 70–99)
Potassium: 3.1 mmol/L — ABNORMAL LOW (ref 3.5–5.1)
SODIUM: 140 mmol/L (ref 135–145)

## 2018-03-01 LAB — MRSA PCR SCREENING: MRSA by PCR: POSITIVE — AB

## 2018-03-01 LAB — CBC
HCT: 45.9 % (ref 35.0–47.0)
HEMOGLOBIN: 15.7 g/dL (ref 12.0–16.0)
MCH: 28.4 pg (ref 26.0–34.0)
MCHC: 34.2 g/dL (ref 32.0–36.0)
MCV: 82.9 fL (ref 80.0–100.0)
PLATELETS: 250 10*3/uL (ref 150–440)
RBC: 5.54 MIL/uL — ABNORMAL HIGH (ref 3.80–5.20)
RDW: 14.5 % (ref 11.5–14.5)
WBC: 12.8 10*3/uL — ABNORMAL HIGH (ref 3.6–11.0)

## 2018-03-01 LAB — GLUCOSE, CAPILLARY
GLUCOSE-CAPILLARY: 218 mg/dL — AB (ref 70–99)
Glucose-Capillary: 323 mg/dL — ABNORMAL HIGH (ref 70–99)

## 2018-03-01 LAB — PROCALCITONIN

## 2018-03-01 LAB — TROPONIN I

## 2018-03-01 MED ORDER — ONDANSETRON HCL 4 MG/2ML IJ SOLN: 4.0000 mg | Freq: Four times a day (QID) | INTRAMUSCULAR | Status: DC | PRN

## 2018-03-01 MED ORDER — AMLODIPINE BESYLATE 5 MG PO TABS
10.0000 mg | ORAL_TABLET | Freq: Every day | ORAL | Status: DC
  Administered 2018-03-02 – 2018-03-03 (×2): 10 mg via ORAL
  Filled 2018-03-01 (×2): qty 2

## 2018-03-01 MED ORDER — LOSARTAN POTASSIUM 50 MG PO TABS
100.0000 mg | ORAL_TABLET | Freq: Every day | ORAL | Status: DC
  Administered 2018-03-02 – 2018-03-03 (×2): 100 mg via ORAL
  Filled 2018-03-01 (×2): qty 2

## 2018-03-01 MED ORDER — METHYLPREDNISOLONE SODIUM SUCC 125 MG IJ SOLR
60.0000 mg | Freq: Four times a day (QID) | INTRAMUSCULAR | Status: DC
  Administered 2018-03-01 – 2018-03-02 (×3): 60 mg via INTRAVENOUS
  Filled 2018-03-01 (×3): qty 2

## 2018-03-01 MED ORDER — GLIPIZIDE ER 5 MG PO TB24
5.0000 mg | ORAL_TABLET | Freq: Every day | ORAL | Status: DC
  Filled 2018-03-01: qty 1

## 2018-03-01 MED ORDER — POTASSIUM CHLORIDE CRYS ER 20 MEQ PO TBCR
20.0000 meq | EXTENDED_RELEASE_TABLET | Freq: Two times a day (BID) | ORAL | Status: DC
  Filled 2018-03-01: qty 1

## 2018-03-01 MED ORDER — SODIUM CHLORIDE 0.9 % IV SOLN: 500.0000 mg | INTRAVENOUS | Status: DC

## 2018-03-01 MED ORDER — PANTOPRAZOLE SODIUM 40 MG PO TBEC: 40.0000 mg | DELAYED_RELEASE_TABLET | Freq: Every day | ORAL | Status: DC

## 2018-03-01 MED ORDER — ALBUTEROL SULFATE (2.5 MG/3ML) 0.083% IN NEBU: 2.5000 mg | INHALATION_SOLUTION | Freq: Four times a day (QID) | RESPIRATORY_TRACT | Status: DC

## 2018-03-01 MED ORDER — IPRATROPIUM-ALBUTEROL 0.5-2.5 (3) MG/3ML IN SOLN: 3.0000 mL | RESPIRATORY_TRACT | Status: DC | PRN

## 2018-03-01 MED ORDER — ONDANSETRON HCL 4 MG PO TABS: 4.0000 mg | ORAL_TABLET | Freq: Four times a day (QID) | ORAL | Status: DC | PRN

## 2018-03-01 MED ORDER — RISAQUAD PO CAPS
1.0000 | ORAL_CAPSULE | Freq: Every day | ORAL | Status: DC
  Administered 2018-03-01 – 2018-03-03 (×3): 1 via ORAL
  Filled 2018-03-01 (×3): qty 1

## 2018-03-01 MED ORDER — NICOTINE 21 MG/24HR TD PT24
21.0000 mg | MEDICATED_PATCH | Freq: Every day | TRANSDERMAL | Status: DC
  Administered 2018-03-01 – 2018-03-02 (×2): 21 mg via TRANSDERMAL
  Filled 2018-03-01 (×3): qty 1

## 2018-03-01 MED ORDER — POTASSIUM CHLORIDE CRYS ER 20 MEQ PO TBCR
40.0000 meq | EXTENDED_RELEASE_TABLET | Freq: Once | ORAL | Status: AC
  Administered 2018-03-01: 40 meq via ORAL
  Filled 2018-03-01: qty 2

## 2018-03-01 MED ORDER — IPRATROPIUM-ALBUTEROL 0.5-2.5 (3) MG/3ML IN SOLN
9.0000 mL | Freq: Once | RESPIRATORY_TRACT | Status: AC
  Administered 2018-03-01: 9 mL via RESPIRATORY_TRACT
  Filled 2018-03-01: qty 3

## 2018-03-01 MED ORDER — ACETAMINOPHEN 650 MG RE SUPP: 650.0000 mg | Freq: Four times a day (QID) | RECTAL | Status: DC | PRN

## 2018-03-01 MED ORDER — IPRATROPIUM BROMIDE 0.02 % IN SOLN: 0.5000 mg | Freq: Four times a day (QID) | RESPIRATORY_TRACT | Status: DC

## 2018-03-01 MED ORDER — METHYLPREDNISOLONE SODIUM SUCC 125 MG IJ SOLR
125.0000 mg | Freq: Once | INTRAMUSCULAR | Status: AC
  Administered 2018-03-01: 125 mg via INTRAVENOUS
  Filled 2018-03-01: qty 2

## 2018-03-01 MED ORDER — ENOXAPARIN SODIUM 40 MG/0.4ML ~~LOC~~ SOLN
40.0000 mg | SUBCUTANEOUS | Status: DC
  Administered 2018-03-01 – 2018-03-02 (×2): 40 mg via SUBCUTANEOUS
  Filled 2018-03-01 (×2): qty 0.4

## 2018-03-01 MED ORDER — NAPROXEN 250 MG PO TABS
250.0000 mg | ORAL_TABLET | Freq: Two times a day (BID) | ORAL | Status: DC | PRN
  Filled 2018-03-01: qty 1

## 2018-03-01 MED ORDER — SODIUM CHLORIDE 0.9 % IV SOLN
500.0000 mg | INTRAVENOUS | Status: DC
  Administered 2018-03-01: 500 mg via INTRAVENOUS
  Filled 2018-03-01 (×2): qty 500

## 2018-03-01 MED ORDER — GUAIFENESIN ER 600 MG PO TB12
600.0000 mg | ORAL_TABLET | Freq: Two times a day (BID) | ORAL | Status: DC
  Administered 2018-03-01 – 2018-03-03 (×4): 600 mg via ORAL
  Filled 2018-03-01 (×4): qty 1

## 2018-03-01 MED ORDER — INSULIN ASPART 100 UNIT/ML ~~LOC~~ SOLN
0.0000 [IU] | Freq: Three times a day (TID) | SUBCUTANEOUS | Status: DC
  Administered 2018-03-01: 6 [IU] via SUBCUTANEOUS
  Administered 2018-03-02 (×2): 5 [IU] via SUBCUTANEOUS
  Administered 2018-03-02: 3 [IU] via SUBCUTANEOUS
  Administered 2018-03-02: 2 [IU] via SUBCUTANEOUS
  Administered 2018-03-03: 08:00:00 1 [IU] via SUBCUTANEOUS
  Administered 2018-03-03: 13:00:00 5 [IU] via SUBCUTANEOUS
  Filled 2018-03-01 (×7): qty 1

## 2018-03-01 MED ORDER — DILTIAZEM HCL ER COATED BEADS 180 MG PO CP24
180.0000 mg | ORAL_CAPSULE | Freq: Every day | ORAL | Status: DC
  Administered 2018-03-02 – 2018-03-03 (×2): 180 mg via ORAL
  Filled 2018-03-01 (×2): qty 1

## 2018-03-01 MED ORDER — LACOSAMIDE 50 MG PO TABS
200.0000 mg | ORAL_TABLET | Freq: Two times a day (BID) | ORAL | Status: DC
  Administered 2018-03-01 – 2018-03-03 (×3): 200 mg via ORAL
  Filled 2018-03-01 (×5): qty 4

## 2018-03-01 MED ORDER — ROSUVASTATIN CALCIUM 10 MG PO TABS
10.0000 mg | ORAL_TABLET | Freq: Every day | ORAL | Status: DC
  Administered 2018-03-01 – 2018-03-03 (×3): 10 mg via ORAL
  Filled 2018-03-01 (×3): qty 1

## 2018-03-01 MED ORDER — POTASSIUM CHLORIDE 20 MEQ PO PACK
20.0000 meq | PACK | Freq: Two times a day (BID) | ORAL | Status: DC
  Administered 2018-03-01 – 2018-03-02 (×3): 20 meq via ORAL
  Filled 2018-03-01 (×4): qty 1

## 2018-03-01 MED ORDER — SODIUM CHLORIDE 0.9% FLUSH
3.0000 mL | Freq: Two times a day (BID) | INTRAVENOUS | Status: DC
  Administered 2018-03-01 – 2018-03-03 (×5): 3 mL via INTRAVENOUS

## 2018-03-01 MED ORDER — SODIUM CHLORIDE 0.9 % IV SOLN
2.0000 g | INTRAVENOUS | Status: DC
  Administered 2018-03-01: 2 g via INTRAVENOUS
  Filled 2018-03-01: qty 2

## 2018-03-01 MED ORDER — MOMETASONE FURO-FORMOTEROL FUM 200-5 MCG/ACT IN AERO: 2.0000 | INHALATION_SPRAY | Freq: Two times a day (BID) | RESPIRATORY_TRACT | Status: DC

## 2018-03-01 MED ORDER — ROPINIROLE HCL 0.25 MG PO TABS
0.2500 mg | ORAL_TABLET | Freq: Three times a day (TID) | ORAL | Status: DC
  Administered 2018-03-01 – 2018-03-03 (×6): 0.25 mg via ORAL
  Filled 2018-03-01 (×8): qty 1

## 2018-03-01 MED ORDER — ACETAMINOPHEN 325 MG PO TABS
650.0000 mg | ORAL_TABLET | Freq: Four times a day (QID) | ORAL | Status: DC | PRN
  Administered 2018-03-02: 650 mg via ORAL
  Filled 2018-03-01: qty 2

## 2018-03-01 MED ORDER — MONTELUKAST SODIUM 10 MG PO TABS
10.0000 mg | ORAL_TABLET | Freq: Every day | ORAL | Status: DC
  Administered 2018-03-01 – 2018-03-02 (×2): 10 mg via ORAL
  Filled 2018-03-01 (×2): qty 1

## 2018-03-01 MED ORDER — CHLORHEXIDINE GLUCONATE CLOTH 2 % EX PADS
6.0000 | MEDICATED_PAD | Freq: Every day | CUTANEOUS | Status: DC
  Administered 2018-03-02 – 2018-03-03 (×2): 6 via TOPICAL

## 2018-03-01 MED ORDER — IPRATROPIUM-ALBUTEROL 0.5-2.5 (3) MG/3ML IN SOLN
3.0000 mL | Freq: Four times a day (QID) | RESPIRATORY_TRACT | Status: DC
  Administered 2018-03-01 – 2018-03-03 (×6): 3 mL via RESPIRATORY_TRACT
  Filled 2018-03-01 (×7): qty 3

## 2018-03-01 MED ORDER — MUPIROCIN 2 % EX OINT
1.0000 "application " | TOPICAL_OINTMENT | Freq: Two times a day (BID) | CUTANEOUS | Status: DC
  Administered 2018-03-01 – 2018-03-03 (×4): 1 via NASAL
  Filled 2018-03-01: qty 22

## 2018-03-01 MED ORDER — INSULIN ASPART 100 UNIT/ML ~~LOC~~ SOLN
0.0000 [IU] | Freq: Three times a day (TID) | SUBCUTANEOUS | Status: DC
  Administered 2018-03-01 (×2): 3 [IU] via SUBCUTANEOUS
  Filled 2018-03-01 (×2): qty 1

## 2018-03-01 MED ORDER — CITALOPRAM HYDROBROMIDE 20 MG PO TABS
20.0000 mg | ORAL_TABLET | Freq: Every day | ORAL | Status: DC
  Administered 2018-03-02 – 2018-03-03 (×2): 20 mg via ORAL
  Filled 2018-03-01 (×2): qty 1

## 2018-03-01 MED ORDER — CLOPIDOGREL BISULFATE 75 MG PO TABS
75.0000 mg | ORAL_TABLET | Freq: Every day | ORAL | Status: DC
  Administered 2018-03-02 – 2018-03-03 (×2): 75 mg via ORAL
  Filled 2018-03-01 (×2): qty 1

## 2018-03-01 MED ORDER — OXCARBAZEPINE 150 MG PO TABS
150.0000 mg | ORAL_TABLET | Freq: Two times a day (BID) | ORAL | Status: DC
  Administered 2018-03-01 – 2018-03-03 (×4): 150 mg via ORAL
  Filled 2018-03-01 (×5): qty 1

## 2018-03-01 MED ORDER — SODIUM CHLORIDE 0.9 % IV SOLN: 250.0000 mL | INTRAVENOUS | Status: DC | PRN

## 2018-03-01 MED ORDER — SODIUM CHLORIDE 0.9 % IV SOLN
1.0000 g | INTRAVENOUS | Status: DC
  Administered 2018-03-02 – 2018-03-03 (×2): 1 g via INTRAVENOUS
  Filled 2018-03-01 (×2): qty 1

## 2018-03-01 MED ORDER — SODIUM CHLORIDE 0.9% FLUSH: 3.0000 mL | INTRAVENOUS | Status: DC | PRN

## 2018-03-01 MED ORDER — BUDESONIDE 0.25 MG/2ML IN SUSP
0.2500 mg | Freq: Two times a day (BID) | RESPIRATORY_TRACT | Status: DC
  Administered 2018-03-01 – 2018-03-03 (×5): 0.25 mg via RESPIRATORY_TRACT
  Filled 2018-03-01 (×5): qty 2

## 2018-03-01 MED ORDER — IPRATROPIUM-ALBUTEROL 0.5-2.5 (3) MG/3ML IN SOLN
RESPIRATORY_TRACT | Status: AC
  Filled 2018-03-01: qty 6

## 2018-03-01 NOTE — ED Triage Notes (Signed)
Pt reports was seen at the MD last week, prescribed medication and is no better.

## 2018-03-01 NOTE — H&P (Signed)
[facility] [place] NAME: [name]    MR#:  [id number]  DATE OF BIRTH:  1951-02-03  DATE OF ADMISSION:  03/01/2018  PRIMARY CARE PHYSICIAN: [facility] [place]   REQUESTING/REFERRING PHYSICIAN:  [name], MD     CHIEF COMPLAINT:   Chief Complaint  Patient presents with  . Shortness of Breath  . Chest Pain  . Back Pain    HISTORY OF PRESENT ILLNESS: [name]  is a 67 y.o. female with a known history of congestive heart failure, cirrhosis, COPD, deaf, depression, diabetes type 2, GERD, essential hypertension, restless leg syndrome, sleep apnea uses oxygen only at nighttime presenting to the emergency room with complaint of shortness of breath cough congestion.  She states that she has not felt well since last week.  She was seen by her primary care provider on Wednesday and was treated with steroids for COPD flare.  Patient continued to have worsening symptoms.  In the ER noted to have elevated WBC count and a pneumonia.  He denies any chest pains.  Denies any diarrhea. PAST MEDICAL HISTORY:   Past Medical History:  Diagnosis Date  . Asthma   . CHF (congestive heart failure) ([place])   . Cirrhosis, non-alcoholic ([place])   . COPD (chronic obstructive pulmonary disease) ([place])   . Deaf   . Depression   . Diabetes mellitus without complication ([place])   . GERD (gastroesophageal reflux disease)   . Heart murmur   . Hepatitis   . Hypertension   . Lymph node disorder    arm  . Neuropathy   . On home oxygen therapy    hs  . Orthopnea   . RLS (restless legs syndrome)   . Seizures ([place])   . Shortness of breath dyspnea   . Sleep apnea   . Stroke [facility])    tia    PAST SURGICAL HISTORY:  Past Surgical History:  Procedure Laterality Date  . CATARACT EXTRACTION W/PHACO Right 11/23/2014   Procedure: CATARACT EXTRACTION PHACO AND INTRAOCULAR LENS PLACEMENT (IOC);  Surgeon: Lyla Glassing, MD;  Location:  [facility] ORS;  Service: Ophthalmology;  Laterality: Right;  . CATARACT EXTRACTION W/PHACO Left 12/14/2014   Procedure: CATARACT EXTRACTION PHACO AND INTRAOCULAR LENS PLACEMENT (IOC);  Surgeon: Lyla Glassing, MD;  Location: [facility] ORS;  Service: Ophthalmology;  Laterality: Left;  US:01:16.6 AP:15.8 CDE:12.14  . CESAREAN SECTION    . CHOLECYSTECTOMY    . KNEE ARTHROPLASTY    . THUMB ARTHROSCOPY    . TONSILLECTOMY    . TYMPANOPLASTY     muliple    SOCIAL HISTORY:  Social History   Tobacco Use  . Smoking status: Current Every Day Smoker    Packs/day: 0.50  . Smokeless tobacco: Never Used  Substance Use Topics  . Alcohol use: No    FAMILY HISTORY:  Family History  Problem Relation Age of Onset  . Lung cancer Mother   . CAD Father     DRUG ALLERGIES:  Allergies  Allergen Reactions  . Aspirin Itching  . Celebrex [Celecoxib] Itching    itching  . Ciprofloxacin Itching  . Codeine Itching  . Fosphenytoin Itching  . Levaquin [Levofloxacin In D5w] Itching  . Levofloxacin Itching  . Lovastatin Itching  . Penicillins     Documentation indicates severe reaction  Pt tolerated cephalosporin without adverse reaction 09/18   . Pravastatin Itching  . Sulfa Antibiotics Itching    REVIEW OF SYSTEMS:   CONSTITUTIONAL:  No fever, fatigue or weakness.  EYES: No blurred or double vision.  EARS, NOSE, AND THROAT: No tinnitus or ear pain.  RESPIRATORY: Positive cough, positive shortness of breath, positive wheezing or hemoptysis.  CARDIOVASCULAR: No chest pain, orthopnea, edema.  GASTROINTESTINAL: No nausea, vomiting, diarrhea or abdominal pain.  GENITOURINARY: No dysuria, hematuria.  ENDOCRINE: No polyuria, nocturia,  HEMATOLOGY: No anemia, easy bruising or bleeding SKIN: No rash or lesion. MUSCULOSKELETAL: No joint pain or arthritis.   NEUROLOGIC: No tingling, numbness, weakness.  PSYCHIATRY: No anxiety or depression.   MEDICATIONS AT HOME:  Prior to Admission medications    Medication Sig Start Date End Date Taking? Authorizing Provider  amLODipine (NORVASC) 10 MG tablet Take 1 tablet (10 mg total) by mouth daily. 08/18/17  Yes Salary, Avel Peace, MD  citalopram (CELEXA) 20 MG tablet Take 1 tablet (20 mg total) by mouth daily. 07/17/16  Yes Vaughan Basta, MD  clopidogrel (PLAVIX) 75 MG tablet Take 1 tablet (75 mg total) by mouth daily. 07/17/16  Yes Vaughan Basta, MD  diltiazem (DILACOR XR) 180 MG 24 hr capsule Take 180 mg by mouth daily.   Yes [provider]  GLIPIZIDE XL 5 MG 24 hr tablet Take 1 tablet (5 mg total) by mouth daily. 05/07/17  Yes Wieting, Richard, MD  losartan (COZAAR) 100 MG tablet Take 1 tablet (100 mg total) by mouth daily. Patient taking differently: Take 25 mg by mouth daily.  08/18/17  Yes Salary, Avel Peace, MD  OXcarbazepine (TRILEPTAL) 150 MG tablet Take 1 tablet (150 mg total) by mouth 2 (two) times daily. 06/17/17  Yes Vaughan Basta, MD  potassium chloride (K-DUR) 10 MEQ tablet Take 2 tablets (20 mEq total) by mouth 2 (two) times daily. 08/26/17 03/01/18 Yes Hackney, Otila Kluver A, FNP  rOPINIRole (REQUIP) 0.25 MG tablet Take 1 tablet (0.25 mg total) by mouth 3 (three) times daily. 03/24/16  Yes Oswald Hillock, MD  acidophilus (RISAQUAD) CAPS capsule Take 1 capsule by mouth daily. 06/27/17   Gladstone Lighter, MD  albuterol (PROVENTIL HFA;VENTOLIN HFA) 108 (90 BASE) MCG/ACT inhaler Inhale 2 puffs into the lungs every 4 (four) hours as needed for wheezing or shortness of breath.    [provider]  albuterol (PROVENTIL) (2.5 MG/3ML) 0.083% nebulizer solution Take 3 mLs (2.5 mg total) by nebulization every 4 (four) hours as needed for wheezing or shortness of breath. 09/23/17   Alisa Graff, FNP  lacosamide (VIMPAT) 200 MG TABS tablet Take 1 tablet (200 mg total) by mouth 2 (two) times daily. Patient not taking: Reported on 03/01/2018 05/07/17   Loletha Grayer, MD  mometasone-formoterol [facility]) 200-5 MCG/ACT  AERO Inhale 2 puffs into the lungs 2 (two) times daily. 07/17/16   Vaughan Basta, MD  montelukast (SINGULAIR) 10 MG tablet Take 1 tablet by mouth at bedtime. 03/18/16   [provider]  omeprazole (PRILOSEC) 20 MG capsule Take 20 mg by mouth daily.    [provider]  rosuvastatin (CRESTOR) 10 MG tablet Take 10 mg by mouth daily. 04/21/17   [provider]  VITAMIN D, CHOLECALCIFEROL, PO Take 50,000 Units by mouth once a week.     [provider]      PHYSICAL EXAMINATION:   VITAL SIGNS: Blood pressure (!) 162/67, pulse 70, temperature 98 F (36.7 C), temperature source Oral, resp. rate 20, height _0  (1.6 m), weight 71.2 kg, SpO2 91 %.  GENERAL:  67 y.o.-year-old patient lying in the bed with no acute distress.  EYES: Pupils  equal, round, reactive to light and accommodation. No scleral icterus. Extraocular muscles intact.  HEENT: Head atraumatic, normocephalic. Oropharynx and nasopharynx clear.  NECK:  Supple, no jugular venous distention. No thyroid enlargement, no tenderness.  LUNGS: Bilateral wheezing throughout both lungs with some rhonchorous breath sounds at both bases cARDIOVASCULAR: S1, S2 normal. No murmurs, rubs, or gallops.  ABDOMEN: Soft, nontender, nondistended. Bowel sounds present. No organomegaly or mass.  EXTREMITIES: No pedal edema, cyanosis, or clubbing.  NEUROLOGIC: Cranial nerves II through XII are intact. Muscle strength 5/5 in all extremities. Sensation intact. Gait not checked.  PSYCHIATRIC: The patient is alert and oriented x 3.  SKIN: No obvious rash, lesion, or ulcer.   LABORATORY PANEL:   CBC Recent Labs  Lab 03/01/18 0939  WBC 12.8*  HGB 15.7  HCT 45.9  PLT 250  MCV 82.9  MCH 28.4  MCHC 34.2  RDW 14.5   ------------------------------------------------------------------------------------------------------------------  Chemistries  Recent Labs  Lab 03/01/18 0939  NA 140  K 3.1*  CL 101  CO2 28   GLUCOSE 107*  BUN 18  CREATININE 0.64  CALCIUM 9.2   ------------------------------------------------------------------------------------------------------------------ estimated creatinine clearance is 65.4 mL/min (by C-G formula based on SCr of 0.64 mg/dL). ------------------------------------------------------------------------------------------------------------------ No results for input(s): TSH, T4TOTAL, T3FREE, THYROIDAB in the last 72 hours.  Invalid input(s): FREET3   Coagulation profile No results for input(s): INR, PROTIME in the last 168 hours. ------------------------------------------------------------------------------------------------------------------- No results for input(s): DDIMER in the last 72 hours. -------------------------------------------------------------------------------------------------------------------  Cardiac Enzymes Recent Labs  Lab 03/01/18 0939  TROPONINI <0.03   ------------------------------------------------------------------------------------------------------------------ Invalid input(s): POCBNP  ---------------------------------------------------------------------------------------------------------------  Urinalysis    Component Value Date/Time   COLORURINE YELLOW (A) 08/14/2017 1622   APPEARANCEUR HAZY (A) 08/14/2017 1622   APPEARANCEUR Clear 11/14/2011 1607   LABSPEC >1.046 (H) 08/14/2017 1622   LABSPEC 1.006 11/14/2011 1607   PHURINE 6.0 08/14/2017 1622   GLUCOSEU NEGATIVE 08/14/2017 1622   GLUCOSEU Negative 11/14/2011 1607   HGBUR NEGATIVE 08/14/2017 1622   BILIRUBINUR NEGATIVE 08/14/2017 1622   BILIRUBINUR Negative 11/14/2011 1607   KETONESUR 5 (A) 08/14/2017 1622   PROTEINUR NEGATIVE 08/14/2017 1622   NITRITE NEGATIVE 08/14/2017 1622   LEUKOCYTESUR SMALL (A) 08/14/2017 1622   LEUKOCYTESUR Negative 11/14/2011 1607     RADIOLOGY: Dg Chest 2 View  Result Date: 03/01/2018 CLINICAL DATA:  Per sign language  interpreter, pt with c/o shortness of breath, chest pain and back pain for the last week. Pt reports cough is productive with white phlegm. EXAM: CHEST - 2 VIEW COMPARISON:  08/14/2017, plain film and chest CT FINDINGS: Heart size is normal. There is subsegmental atelectasis at both lung bases. There is dense opacity in the LEFT lung base, obscuring the MEDIAL aspect of the hemidiaphragm and consistent with consolidation. No pulmonary edema. Surgical clips are present in the RIGHT UPPER QUADRANT the abdomen. Visualized osseous structures have a normal appearance. IMPRESSION: LEFT LOWER lobe infiltrate. Followup PA and lateral chest X-ray is recommended in 3-4 weeks following trial of antibiotic therapy to ensure resolution and exclude underlying malignancy. Bibasilar atelectasis. Electronically Signed   By: Nolon Nations M.D.   On: 03/01/2018 09:32    EKG: Orders placed or performed during the hospital encounter of 03/01/18  . ED EKG within 10 minutes  . ED EKG within 10 minutes    IMPRESSION AND PLAN: Patient is 67 year old with history of COPD presenting with worsening shortness of breath  1.  Community-acquired pneumonia we will treat with IV ceftriaxone azithromycin I will check  procalcitonin levels Obtain sputum cultures  2.  Acute on chronic COPD exasperation we will treat with nebulizer therapy, Solu-Medrol  3.  Hypokalemia acute on chronic I will give her additional potassium in addition to what she takes at home  4.  Essential hypertension continue therapy with amlodipine  5.  Diabetes type 2 continue glipizide  6.  Previous history of stroke continue Plavix  7.  Nicotine abuse smoking cessation provided to the patient I strongly recommend she stop smoking nicotine patch will be started  All the records are reviewed and case discussed with ED provider. Management plans discussed with the patient, family and they are in agreement.  CODE STATUS: Code Status History    Date  Active Date Inactive Code Status Order ID Comments User Context   08/15/2017 0325 08/17/2017 1710 Full Code [id number]  Lance Coon, MD Inpatient   06/26/2017 0034 06/27/2017 1432 Full Code [id number]  Gorden Harms, MD Inpatient   06/15/2017 2015 06/17/2017 1627 Full Code [id number]  Fritzi Mandes, MD Inpatient   06/15/2017 1847 06/15/2017 2015 DNR [id number]  Fritzi Mandes, MD ED   05/04/2017 0835 05/07/2017 1832 DNR [id number]  Max Sane, MD Inpatient   04/29/2017 1915 05/04/2017 0835 Full Code [id number]  Angela Adam, RN Inpatient   03/25/2017 0235 03/30/2017 1907 Full Code [id number]  Hugelmeyer, [place], DO Inpatient   02/11/2017 2131 02/12/2017 1636 Full Code [id number]  Demetrios Loll, MD Inpatient   07/11/2016 1131 07/17/2016 1508 Partial Code [id number]  Marijo Conception, RN Inpatient   07/11/2016 0237 07/11/2016 1131 Full Code [id number]  Harvie Bridge, DO Inpatient   03/22/2016 2244 03/24/2016 1912 Partial Code [id number]  Toy Baker, MD Inpatient   03/29/2015 1130 03/31/2015 1421 Partial Code [id number]  Loletha Grayer, MD ED       TOTAL TIME TAKING CARE OF THIS PATIENT:55 minutes.    Dustin Flock M.D on 03/01/2018 at 10:37 AM  Between 7am to 6pm - Pager - [phone]  After 6pm go to [email or web address] - Proofreader  [employer] Office  [phone]  CC: Primary care physician; [facility]

## 2018-03-01 NOTE — ED Notes (Addendum)
When pt was ambulatory to restroom pt desat to 87% pt was then returned to bed. Pt is currently on Neb tx and is maintaining sat at 96 %, will reassess O2 sat when pt is done. RN will monitor.

## 2018-03-01 NOTE — ED Notes (Signed)
Interpreter used ID [id number]

## 2018-03-01 NOTE — ED Notes (Signed)
Patient transported to X-ray 

## 2018-03-01 NOTE — ED Provider Notes (Signed)
[facility] Emergency Department Provider Note ____________________________________________   First MD Initiated Contact with Patient 03/01/18 [phone]     (approximate)  I have reviewed the triage vital signs and the nursing notes.  American sign language interpreter used for interpretation. HISTORY  Chief Complaint Shortness of Breath; Chest Pain; and Back Pain  HPI [name] is a 67 y.o. female with history of CHF as well as COPD and deafness was presented to the emergency department today with shortness of breath over the past week.  Says that she has a productive cough associated with the.  Has been taking azithromycin as well as prednisone over the past 4 days without any improvement in her symptoms.  She also says that she has a squeezing chest and back pain that worsens when she walks.  She says the pain is across her upper and lower back as well as the front of her chest.  Past Medical History:  Diagnosis Date  . Asthma   . CHF (congestive heart failure) ([place])   . Cirrhosis, non-alcoholic ([place])   . COPD (chronic obstructive pulmonary disease) ([place])   . Deaf   . Depression   . Diabetes mellitus without complication ([place])   . GERD (gastroesophageal reflux disease)   . Heart murmur   . Hepatitis   . Hypertension   . Lymph node disorder    arm  . Neuropathy   . On home oxygen therapy    hs  . Orthopnea   . RLS (restless legs syndrome)   . Seizures ([place])   . Shortness of breath dyspnea   . Sleep apnea   . Stroke [facility])    tia    Patient Active Problem List   Diagnosis Date Noted  . Bronchitis 09/23/2017  . Chronic diastolic heart failure ([place]) 08/26/2017  . Tobacco use 08/26/2017  . Seizures ([place]) 06/15/2017  . Goals of care, counseling/discussion 05/06/2017  . Status epilepticus ([place])   . CAP (community acquired pneumonia)   . Hypoxia   . Hypokalemia 02/12/2017  . GERD (gastroesophageal reflux disease) 03/23/2016  . Depression  03/23/2016  . DM (diabetes mellitus), type 2 ([place]) 03/22/2016  . Essential hypertension 03/22/2016  . Neuropathy 03/22/2016  . Convulsions ([place]) 03/22/2016  . COPD exacerbation ([place]) 03/31/2015    Past Surgical History:  Procedure Laterality Date  . CATARACT EXTRACTION W/PHACO Right 11/23/2014   Procedure: CATARACT EXTRACTION PHACO AND INTRAOCULAR LENS PLACEMENT (IOC);  Surgeon: Lyla Glassing, MD;  Location: [facility] ORS;  Service: Ophthalmology;  Laterality: Right;  . CATARACT EXTRACTION W/PHACO Left 12/14/2014   Procedure: CATARACT EXTRACTION PHACO AND INTRAOCULAR LENS PLACEMENT (IOC);  Surgeon: Lyla Glassing, MD;  Location: [facility] ORS;  Service: Ophthalmology;  Laterality: Left;  US:01:16.6 AP:15.8 CDE:12.14  . CESAREAN SECTION    . CHOLECYSTECTOMY    . KNEE ARTHROPLASTY    . THUMB ARTHROSCOPY    . TONSILLECTOMY    . TYMPANOPLASTY     muliple    Prior to Admission medications   Medication Sig Start Date End Date Taking? Authorizing Provider  acidophilus (RISAQUAD) CAPS capsule Take 1 capsule by mouth daily. 06/27/17   Gladstone Lighter, MD  albuterol (PROVENTIL HFA;VENTOLIN HFA) 108 (90 BASE) MCG/ACT inhaler Inhale 2 puffs into the lungs every 4 (four) hours as needed for wheezing or shortness of breath.    [provider]  albuterol (PROVENTIL) (2.5 MG/3ML) 0.083% nebulizer solution Take 3 mLs (2.5 mg total) by nebulization every 4 (four) hours as needed  for wheezing or shortness of breath. 09/23/17   Darylene Price A, FNP  amLODipine (NORVASC) 10 MG tablet Take 1 tablet (10 mg total) by mouth daily. 08/18/17   Salary, Avel Peace, MD  citalopram (CELEXA) 20 MG tablet Take 1 tablet (20 mg total) by mouth daily. 07/17/16   Vaughan Basta, MD  clopidogrel (PLAVIX) 75 MG tablet Take 1 tablet (75 mg total) by mouth daily. 07/17/16   Vaughan Basta, MD  diltiazem (DILACOR XR) 180 MG 24 hr capsule Take 180 mg by mouth daily.    [provider]  furosemide  (LASIX) 20 MG tablet Take 20 mg by mouth daily.    [provider]  gabapentin (NEURONTIN) 300 MG capsule Take 600 mg by mouth daily.    [provider]  GLIPIZIDE XL 5 MG 24 hr tablet Take 1 tablet (5 mg total) by mouth daily. 05/07/17   Loletha Grayer, MD  lacosamide (VIMPAT) 200 MG TABS tablet Take 1 tablet (200 mg total) by mouth 2 (two) times daily. 05/07/17   Loletha Grayer, MD  losartan (COZAAR) 100 MG tablet Take 1 tablet (100 mg total) by mouth daily. Patient taking differently: Take 25 mg by mouth daily.  08/18/17   Salary, Avel Peace, MD  mometasone-formoterol (DULERA) 200-5 MCG/ACT AERO Inhale 2 puffs into the lungs 2 (two) times daily. 07/17/16   Vaughan Basta, MD  montelukast (SINGULAIR) 10 MG tablet Take 1 tablet by mouth at bedtime. 03/18/16   [provider]  omeprazole (PRILOSEC) 20 MG capsule Take 20 mg by mouth daily.    [provider]  OXcarbazepine (TRILEPTAL) 150 MG tablet Take 1 tablet (150 mg total) by mouth 2 (two) times daily. 06/17/17   Vaughan Basta, MD  potassium chloride (K-DUR) 10 MEQ tablet Take 2 tablets (20 mEq total) by mouth 2 (two) times daily. 08/26/17 12/30/17  Alisa Graff, FNP  rOPINIRole (REQUIP) 0.25 MG tablet Take 1 tablet (0.25 mg total) by mouth 3 (three) times daily. 03/24/16   Oswald Hillock, MD  rosuvastatin (CRESTOR) 10 MG tablet Take 10 mg by mouth daily. 04/21/17   [provider]  VITAMIN D, CHOLECALCIFEROL, PO Take 50,000 Units by mouth once a week.     [provider]    Allergies Aspirin; Celebrex [celecoxib]; Ciprofloxacin; Codeine; Fosphenytoin; Levaquin [levofloxacin in d5w]; Levofloxacin; Lovastatin; Penicillins; Pravastatin; and Sulfa antibiotics  Family History  Problem Relation Age of Onset  . Lung cancer Mother   . CAD Father     Social History Social History   Tobacco Use  . Smoking status: Current Every Day Smoker    Packs/day: 0.50  . Smokeless  tobacco: Never Used  Substance Use Topics  . Alcohol use: No  . Drug use: No    Review of Systems  Constitutional: No fever/chills Eyes: No visual changes. ENT: No sore throat. Cardiovascular: As above Respiratory: As above Gastrointestinal: No abdominal pain.  No nausea, no vomiting.  No diarrhea.  No constipation. Genitourinary: Negative for dysuria. Musculoskeletal: As above Skin: Negative for rash. Neurological: Negative for headaches, focal weakness or numbness.   ____________________________________________   PHYSICAL EXAM:  VITAL SIGNS: ED Triage Vitals  Enc Vitals Group     BP 03/01/18 0853 (!) 162/67     Pulse Rate 03/01/18 0853 70     Resp 03/01/18 0853 20     Temp 03/01/18 0853 98 F (36.7 C)     Temp Source 03/01/18 0853 Oral     SpO2 03/01/18  0853 91 %     Weight 03/01/18 0855 157 lb (71.2 kg)     Height 03/01/18 0855 _0  (1.6 m)     Head Circumference --      Peak Flow --      Pain Score 03/01/18 0854 8     Pain Loc --      Pain Edu? --      Excl. in [place]? --     Constitutional: Alert and oriented. Well appearing and in no acute distress. Eyes: Conjunctivae are normal.  Head: Atraumatic. Nose: No congestion/rhinnorhea. Mouth/Throat: Mucous membranes are moist.  Neck: No stridor.   Cardiovascular: Normal rate, regular rhythm. Grossly normal heart sounds.  Chest pain as well as back pain reproducible to palpation. Respiratory: Patient is tachypneic but does not appear in distress.  Wheezing throughout on expiration with slightly decreased air movement and prolonged respiratory phase. Gastrointestinal: Soft and nontender. No distention.  Musculoskeletal: No lower extremity tenderness nor edema.  No joint effusions. Neurologic:  Normal speech and language. No gross focal neurologic deficits are appreciated. Skin:  Skin is warm, dry and intact. No rash noted. Psychiatric: Mood and affect are normal. Speech and behavior are  normal.  ____________________________________________   LABS (all labs ordered are listed, but only abnormal results are displayed)  Labs Reviewed  BASIC METABOLIC PANEL - Abnormal; Notable for the following components:      Result Value   Potassium 3.1 (*)    Glucose, Bld 107 (*)    All other components within normal limits  CBC - Abnormal; Notable for the following components:   WBC 12.8 (*)    RBC 5.54 (*)    All other components within normal limits  TROPONIN I   ____________________________________________  EKG  ED ECG REPORT I, Doran Stabler, the attending physician, personally viewed and interpreted this ECG.   Date: 03/01/2018  EKG Time: 0858  Rate: 62  Rhythm: normal sinus rhythm  Axis: Normal  Intervals:none  ST&T Change: No ST segment elevation or depression.  No abnormal T wave inversion.  ____________________________________________  RADIOLOGY  Left lower lobe pneumonia ____________________________________________   PROCEDURES  Procedure(s) performed:   Procedures  Critical Care performed:   ____________________________________________   INITIAL IMPRESSION / ASSESSMENT AND PLAN / ED COURSE  Pertinent labs & imaging results that were available during my care of the patient were reviewed by me and considered in my medical decision making (see chart for details).  Differential includes, but is not limited to, viral syndrome, bronchitis including COPD exacerbation, pneumonia, reactive airway disease including asthma, CHF including exacerbation with or without pulmonary/interstitial edema, pneumothorax, ACS, thoracic trauma, and pulmonary embolism. As part of my medical decision making, I reviewed the following data within the electronic MEDICAL RECORD NUMBER Notes from prior ED visits  ----------------------------------------- 10:22 AM on 03/01/2018 -----------------------------------------  With failure of outpatient antibiotics, now with left  lower lobe pneumonia.  Elevated white blood cell count and tachypnea.  Will be admitted to the hospital.  Signed out the hospitalist.  Patient aware of the diagnosis as well as treatment plan willing to comply. ____________________________________________   FINAL CLINICAL IMPRESSION(S) / ED DIAGNOSES  Community-acquired pneumonia.  Bronchospasm.  NEW MEDICATIONS STARTED DURING THIS VISIT:  New Prescriptions   No medications on file     Note:  This document was prepared using Dragon voice recognition software and may include unintentional dictation errors.     [name], MD 03/01/18 1022

## 2018-03-01 NOTE — ED Triage Notes (Signed)
Per sign language interpreter, pt with c/o shortness of breath, chest pain and back pain for the last week. Pt reports cough is productive with white phlem.

## 2018-03-01 NOTE — ED Notes (Signed)
.  Pt ambulatory to commode.  Pt received peri care at this time by pt.   RN will monitor.

## 2018-03-01 NOTE — ED Notes (Signed)
Report attempted at this time. RN will monitor.

## 2018-03-01 NOTE — Progress Notes (Signed)
Advanced care plan.  Purpose of the Encounter: CODE STATUS  Parties in Attendance: Patient herself  Patient's Decision Capacity: Intact please note patient is deaf, so interpretation services were used to discuss this  Subjective/Patient's story: Patient is a 67 year old with history of COPD, diabetes, hypertension previous stroke presenting to the hospital with worsening shortness of breath noted to have pneumonia   Objective/Medical story Discussed with the patient regarding her desires to be resuscitated cardiac and pulmonary  vise  Goals of care determination:  Full code   CODE STATUS:  Full code  Time spent discussing advanced care planning: 16 minutes

## 2018-03-01 NOTE — Progress Notes (Signed)
CODE SEPSIS - PHARMACY COMMUNICATION  **Broad Spectrum Antibiotics should be administered within 1 hour of Sepsis diagnosis**  Time Code Sepsis Called/Page Received: 1020  Antibiotics Ordered: Rocephin, azithromycin  Time of 1st antibiotic administration: 1114  Additional action taken by pharmacy: none  If necessary, Name of Provider/Nurse Contacted: N/A    Dallie Piles ,PharmD Clinical Pharmacist  03/01/2018  11:59 AM

## 2018-03-02 LAB — CBC
HCT: 43.1 % (ref 35.0–47.0)
Hemoglobin: 14.9 g/dL (ref 12.0–16.0)
MCH: 28.7 pg (ref 26.0–34.0)
MCHC: 34.7 g/dL (ref 32.0–36.0)
MCV: 82.7 fL (ref 80.0–100.0)
PLATELETS: 217 10*3/uL (ref 150–440)
RBC: 5.21 MIL/uL — AB (ref 3.80–5.20)
RDW: 14 % (ref 11.5–14.5)
WBC: 9.7 10*3/uL (ref 3.6–11.0)

## 2018-03-02 LAB — BASIC METABOLIC PANEL
ANION GAP: 9 (ref 5–15)
BUN: 19 mg/dL (ref 8–23)
CHLORIDE: 104 mmol/L (ref 98–111)
CO2: 26 mmol/L (ref 22–32)
Calcium: 9 mg/dL (ref 8.9–10.3)
Creatinine, Ser: 0.62 mg/dL (ref 0.44–1.00)
GFR calc Af Amer: >60 mL/min (ref >60)
GLUCOSE: 203 mg/dL — AB (ref 70–99)
POTASSIUM: 4.3 mmol/L (ref 3.5–5.1)
Sodium: 139 mmol/L (ref 135–145)

## 2018-03-02 LAB — HEMOGLOBIN A1C
Hgb A1c MFr Bld: 6.2 % — ABNORMAL HIGH (ref 4.8–5.6)
MEAN PLASMA GLUCOSE: 131.24 mg/dL

## 2018-03-02 LAB — GLUCOSE, CAPILLARY
GLUCOSE-CAPILLARY: 277 mg/dL — AB (ref 70–99)
Glucose-Capillary: 284 mg/dL — ABNORMAL HIGH (ref 70–99)
Glucose-Capillary: 175 mg/dL — ABNORMAL HIGH (ref 70–99)
Glucose-Capillary: 230 mg/dL — ABNORMAL HIGH (ref 70–99)

## 2018-03-02 MED ORDER — AZITHROMYCIN 500 MG PO TABS
250.0000 mg | ORAL_TABLET | Freq: Every day | ORAL | Status: DC
  Administered 2018-03-02 – 2018-03-03 (×2): 250 mg via ORAL
  Filled 2018-03-02 (×2): qty 1

## 2018-03-02 MED ORDER — GLIPIZIDE ER 5 MG PO TB24: 5.0000 mg | ORAL_TABLET | Freq: Every day | ORAL | Status: DC

## 2018-03-02 MED ORDER — GLIPIZIDE ER 5 MG PO TB24
5.0000 mg | ORAL_TABLET | Freq: Every day | ORAL | Status: DC
  Administered 2018-03-02 – 2018-03-03 (×2): 5 mg via ORAL
  Filled 2018-03-02 (×2): qty 1

## 2018-03-02 MED ORDER — METHYLPREDNISOLONE SODIUM SUCC 40 MG IJ SOLR
40.0000 mg | Freq: Two times a day (BID) | INTRAMUSCULAR | Status: DC
  Administered 2018-03-02 – 2018-03-03 (×2): 40 mg via INTRAVENOUS
  Filled 2018-03-02 (×2): qty 1

## 2018-03-02 MED ORDER — PANTOPRAZOLE SODIUM 40 MG PO TBEC
40.0000 mg | DELAYED_RELEASE_TABLET | Freq: Every day | ORAL | Status: DC
  Administered 2018-03-02 – 2018-03-03 (×2): 40 mg via ORAL
  Filled 2018-03-02 (×2): qty 1

## 2018-03-02 MED ORDER — NAPROXEN 250 MG PO TABS
250.0000 mg | ORAL_TABLET | Freq: Two times a day (BID) | ORAL | Status: DC | PRN
  Filled 2018-03-02: qty 1

## 2018-03-02 NOTE — Progress Notes (Signed)
Patient is deaf, but reads lips very well, also knows ASL.  Interpretor tablet used to communicate via ASL for admissions profile and introduction.  Tablet at bedside to use as needed to communicate with patient.  Note by unit clerk so they know if patient rings out, someone needs to go see what patient needs as she cannot hear.  Clarise Cruz, RN, BSN

## 2018-03-02 NOTE — Progress Notes (Signed)
Inpatient Diabetes Program Recommendations  AACE/ADA: New Consensus Statement on Inpatient Glycemic Control (2019)  Target Ranges:  Prepandial:   less than 140 mg/dL      Peak postprandial:   less than 180 mg/dL (1-2 hours)      Critically ill patients:  140 - 180 mg/dL   Results for [name], [name] (MRN [id number]) as of 03/02/2018 09:10  Ref. Range 03/01/2018 14:35 03/01/2018 21:29 03/02/2018 07:55  Glucose-Capillary Latest Ref Range: 70 - 99 mg/dL 218 (H) 323 (H) 277 (H)   Review of Glycemic Control  Diabetes history: DM2 Outpatient Diabetes medications: Glipizide XL 5 mg daily Current orders for Inpatient glycemic control: Glipizide XL 5 mg QAM, Novolog 0-9 units ACHS; Solumedrol 60 mg Q6H  Inpatient Diabetes Program Recommendations: Insulin - Meal Coverage: If steroids are continued, please consider ordering Novolog 4 units TID with meals for meal coverage if patient eats at least 50% of meals.  Thanks, Barnie Alderman, RN, MSN, CDE Diabetes Coordinator Inpatient Diabetes Program [phone] (Team Pager from 8am to 5pm)

## 2018-03-02 NOTE — Progress Notes (Signed)
PHARMACIST - PHYSICIAN COMMUNICATION DR:   Darvin Neighbours CONCERNING: Antibiotic IV to Oral Route Change Policy  RECOMMENDATION: This patient is receiving azithromycin by the intravenous route.  Based on criteria approved by the Pharmacy and Therapeutics Committee, the antibiotic(s) is/are being converted to the equivalent oral dose form(s).   DESCRIPTION: These criteria include:  Patient being treated for a respiratory tract infection, urinary tract infection, cellulitis or clostridium difficile associated diarrhea if on metronidazole  The patient is not neutropenic and does not exhibit a GI malabsorption state  The patient is eating (either orally or via tube) and/or has been taking other orally administered medications for a least 24 hours  The patient is improving clinically and has a Tmax < 100.5  If you have questions about this conversion, please contact the Pharmacy Department  _0   [phone] )  [facility] _1   [phone] )  [facility] _2   [phone] )  [facility] _3   [phone] )  [facility] _4   [phone] )  [place]. [place], [place].D., BCPS Clinical Pharmacist 03/02/18 08:38

## 2018-03-02 NOTE — Plan of Care (Signed)
  Problem: Education: Goal: Knowledge of General Education information will improve Description Including pain rating scale, medication(s)/side effects and non-pharmacologic comfort measures Outcome: Progressing   Problem: Health Behavior/Discharge Planning: Goal: Ability to manage health-related needs will improve Outcome: Progressing   Problem: Clinical Measurements: Goal: Ability to maintain clinical measurements within normal limits will improve Outcome: Progressing Goal: Will remain free from infection Outcome: Progressing Goal: Diagnostic test results will improve Outcome: Progressing Goal: Respiratory complications will improve Outcome: Progressing Goal: Cardiovascular complication will be avoided Outcome: Progressing   Problem: Activity: Goal: Risk for activity intolerance will decrease Outcome: Progressing   Problem: Nutrition: Goal: Adequate nutrition will be maintained Outcome: Progressing   Problem: Coping: Goal: Level of anxiety will decrease Outcome: Progressing   Problem: Elimination: Goal: Will not experience complications related to bowel motility Outcome: Progressing Goal: Will not experience complications related to urinary retention Outcome: Progressing   Problem: Pain Managment: Goal: General experience of comfort will improve Outcome: Progressing   Problem: Safety: Goal: Ability to remain free from injury will improve Outcome: Progressing   Problem: Skin Integrity: Goal: Risk for impaired skin integrity will decrease Outcome: Progressing   Problem: Metabolic: Goal: Ability to maintain appropriate glucose levels will improve Outcome: Progressing   Problem: Activity: Goal: Ability to tolerate increased activity will improve Outcome: Progressing   Problem: Respiratory: Goal: Ability to maintain adequate ventilation will improve Outcome: Progressing

## 2018-03-02 NOTE — Progress Notes (Signed)
[place] [facility] [place] NAME: [name]    MR#:  [id number]  DATE OF BIRTH:  December 21, 1950  SUBJECTIVE:  CHIEF COMPLAINT:   Chief Complaint  Patient presents with  . Shortness of Breath  . Chest Pain  . Back Pain   SOB is better Cough, dry  REVIEW OF SYSTEMS:    Review of Systems  Constitutional: Positive for malaise/fatigue. Negative for chills and fever.  HENT: Negative for sore throat.   Eyes: Negative for blurred vision, double vision and pain.  Respiratory: Positive for cough and shortness of breath. Negative for hemoptysis and wheezing.   Cardiovascular: Negative for chest pain, palpitations, orthopnea and leg swelling.  Gastrointestinal: Negative for abdominal pain, constipation, diarrhea, heartburn, nausea and vomiting.  Genitourinary: Negative for dysuria and hematuria.  Musculoskeletal: Negative for back pain and joint pain.  Skin: Negative for rash.  Neurological: Negative for sensory change, speech change, focal weakness and headaches.  Endo/Heme/Allergies: Does not bruise/bleed easily.  Psychiatric/Behavioral: Negative for depression. The patient is not nervous/anxious.     DRUG ALLERGIES:   Allergies  Allergen Reactions  . Aspirin Itching  . Celebrex [Celecoxib] Itching    itching  . Ciprofloxacin Itching  . Codeine Itching  . Fosphenytoin Itching  . Levaquin [Levofloxacin In D5w] Itching  . Levofloxacin Itching  . Lovastatin Itching  . Penicillins     Documentation indicates severe reaction  Pt tolerated cephalosporin without adverse reaction 09/18   . Pravastatin Itching  . Sulfa Antibiotics Itching    VITALS:  Blood pressure 140/62, pulse 66, temperature 98.4 F (36.9 C), temperature source Oral, resp. rate 19, height _0  (1.6 m), weight 72.5 kg, SpO2 93 %.  PHYSICAL EXAMINATION:   Physical Exam  GENERAL:  67 y.o.-year-old patient lying in the bed with no acute distress.  EYES: Pupils equal,  round, reactive to light and accommodation. No scleral icterus. Extraocular muscles intact.  HEENT: Head atraumatic, normocephalic. Oropharynx and nasopharynx clear.  NECK:  Supple, no jugular venous distention. No thyroid enlargement, no tenderness.  LUNGS: Mild b/l wheezing CARDIOVASCULAR: S1, S2 normal. No murmurs, rubs, or gallops.  ABDOMEN: Soft, nontender, nondistended. Bowel sounds present. No organomegaly or mass.  EXTREMITIES: No cyanosis, clubbing or edema b/l.    NEUROLOGIC: Cranial nerves II through XII are intact. No focal Motor or sensory deficits b/l.   PSYCHIATRIC: The patient is alert and oriented x 3.  SKIN: No obvious rash, lesion, or ulcer.   LABORATORY PANEL:   CBC Recent Labs  Lab 03/02/18 0415  WBC 9.7  HGB 14.9  HCT 43.1  PLT 217   ------------------------------------------------------------------------------------------------------------------ Chemistries  Recent Labs  Lab 03/02/18 0415  NA 139  K 4.3  CL 104  CO2 26  GLUCOSE 203*  BUN 19  CREATININE 0.62  CALCIUM 9.0   ------------------------------------------------------------------------------------------------------------------  Cardiac Enzymes Recent Labs  Lab 03/01/18 0939  TROPONINI <0.03   ------------------------------------------------------------------------------------------------------------------  RADIOLOGY:  Dg Chest 2 View  Result Date: 03/01/2018 CLINICAL DATA:  Per sign language interpreter, pt with c/o shortness of breath, chest pain and back pain for the last week. Pt reports cough is productive with white phlegm. EXAM: CHEST - 2 VIEW COMPARISON:  08/14/2017, plain film and chest CT FINDINGS: Heart size is normal. There is subsegmental atelectasis at both lung bases. There is dense opacity in the LEFT lung base, obscuring the MEDIAL aspect of the hemidiaphragm and consistent with consolidation. No pulmonary edema. Surgical clips are present in  the RIGHT UPPER QUADRANT the  abdomen. Visualized osseous structures have a normal appearance. IMPRESSION: LEFT LOWER lobe infiltrate. Followup PA and lateral chest X-ray is recommended in 3-4 weeks following trial of antibiotic therapy to ensure resolution and exclude underlying malignancy. Bibasilar atelectasis. Electronically Signed   By: Nolon Nations M.D.   On: 03/01/2018 09:32   ASSESSMENT AND PLAN:   Patient is 67 year old with history of COPD presenting with worsening shortness of breath  * Community-acquired pneumonia - Left lower lobe On IV abx Cx pending Improving  * Hypokalemia Replaced  * Essential hypertension Amlodipine  * Diabetes type 2 continue glipizide  * Previous history of stroke continue Plavix  * Nicotine abuse Counseled on admission  All the records are reviewed and case discussed with Care Management/Social Worker Management plans discussed with the patient, family and they are in agreement.  CODE STATUS: FULL CODE  DVT Prophylaxis: SCDs  TOTAL TIME TAKING CARE OF THIS PATIENT: 30 minutes.   POSSIBLE D/C IN 1-2 DAYS, DEPENDING ON CLINICAL CONDITION.  Leia Alf Dorisann Schwanke M.D on 03/02/2018 at 11:05 AM  Between 7am to 6pm - Pager - [phone]  After 6pm go to [email or web address] - password [email or web address] [place]  Office  [phone]  CC: Primary care physician; Center, [facility]  Note: This dictation was prepared with Dragon dictation along with smaller phrase technology. Any transcriptional errors that result from this process are unintentional.

## 2018-03-03 LAB — BASIC METABOLIC PANEL
ANION GAP: 10 (ref 5–15)
BUN: 21 mg/dL (ref 8–23)
CALCIUM: 8.8 mg/dL — AB (ref 8.9–10.3)
CO2: 28 mmol/L (ref 22–32)
CREATININE: 0.79 mg/dL (ref 0.44–1.00)
Chloride: 100 mmol/L (ref 98–111)
GFR calc Af Amer: >60 mL/min (ref >60)
GFR calc non Af Amer: >60 mL/min (ref >60)
GLUCOSE: 234 mg/dL — AB (ref 70–99)
Potassium: 4.4 mmol/L (ref 3.5–5.1)
Sodium: 138 mmol/L (ref 135–145)

## 2018-03-03 LAB — GLUCOSE, CAPILLARY
GLUCOSE-CAPILLARY: 125 mg/dL — AB (ref 70–99)
Glucose-Capillary: 266 mg/dL — ABNORMAL HIGH (ref 70–99)

## 2018-03-03 MED ORDER — IPRATROPIUM-ALBUTEROL 0.5-2.5 (3) MG/3ML IN SOLN
3.0000 mL | Freq: Three times a day (TID) | RESPIRATORY_TRACT | Status: DC
  Filled 2018-03-03: qty 3

## 2018-03-03 MED ORDER — PREDNISONE 10 MG (21) PO TBPK: ORAL_TABLET | ORAL | 0 refills | Status: DC

## 2018-03-03 MED ORDER — CEPHALEXIN 500 MG PO CAPS: 500.0000 mg | ORAL_CAPSULE | Freq: Three times a day (TID) | ORAL | 0 refills | Status: AC

## 2018-03-03 NOTE — Discharge Instructions (Signed)
Wear oxygen 2 liters/min 24 hrs till you have your oxygen levels checked again with your Primary care physician

## 2018-03-06 LAB — CULTURE, BLOOD (ROUTINE X 2)
Culture: NO GROWTH
Culture: NO GROWTH

## 2018-03-14 NOTE — Discharge Summary (Signed)
[place] [facility] [place] NAME: [name]    MR#:  [id number]  DATE OF BIRTH:  1951-05-10  DATE OF ADMISSION:  03/01/2018 ADMITTING PHYSICIAN: Dustin Flock, MD  DATE OF DISCHARGE: 03/03/2018  2:16 PM  PRIMARY CARE PHYSICIAN: [facility], [place]   ADMISSION DIAGNOSIS:  COPD exacerbation ([place]) [J44.1] Sepsis, due to unspecified organism ([place]) [A41.9] Community acquired pneumonia of left lower lobe of lung ([place]) [J18.1]  DISCHARGE DIAGNOSIS:  Active Problems:   PNA (pneumonia)   SECONDARY DIAGNOSIS:   Past Medical History:  Diagnosis Date  . Asthma   . CHF (congestive heart failure) ([place])   . Cirrhosis, non-alcoholic ([place])   . COPD (chronic obstructive pulmonary disease) ([place])   . Deaf   . Depression   . Diabetes mellitus without complication ([place])   . GERD (gastroesophageal reflux disease)   . Heart murmur   . Hepatitis   . Hypertension   . Lymph node disorder    arm  . Neuropathy   . On home oxygen therapy    hs  . Orthopnea   . RLS (restless legs syndrome)   . Seizures ([place])   . Shortness of breath dyspnea   . Sleep apnea   . Stroke [facility])    tia     ADMITTING HISTORY  HISTORY OF PRESENT ILLNESS: [name]  is a 67 y.o. female with a known history of congestive heart failure, cirrhosis, COPD, deaf, depression, diabetes type 2, GERD, essential hypertension, restless leg syndrome, sleep apnea uses oxygen only at nighttime presenting to the emergency room with complaint of shortness of breath cough congestion.  She states that she has not felt well since last week.  She was seen by her primary care provider on Wednesday and was treated with steroids for COPD flare.  Patient continued to have worsening symptoms.  In the ER noted to have elevated WBC count and a pneumonia.  He denies any chest pains.  Denies any diarrhea.  HOSPITAL COURSE:   *Left lower lobe community-acquired pneumonia *COPD  exacerbation *Hypokalemia *Hypertension *Diabetes meds *Tobacco abuse  Patient was admitted to medical floor.  Started on IV ceftriaxone and azithromycin.  Steroids for COPD exacerbation.  Oxygen used as needed.  Nebulizers.  By the day of discharge patient has improved well.  Continues to need oxygen which she will be discharged home with.  Change to oral Levaquin to finish antibiotic course.  Prednisone taper.  Follow-up with primary care physician in 1 week.  Stable for discharge home.  CONSULTS OBTAINED:    DRUG ALLERGIES:   Allergies  Allergen Reactions  . Aspirin Itching  . Celebrex [Celecoxib] Itching    itching  . Ciprofloxacin Itching  . Codeine Itching  . Fosphenytoin Itching  . Levaquin [Levofloxacin In D5w] Itching  . Levofloxacin Itching  . Lovastatin Itching  . Penicillins     Documentation indicates severe reaction  Pt tolerated cephalosporin without adverse reaction 09/18   . Pravastatin Itching  . Sulfa Antibiotics Itching    DISCHARGE MEDICATIONS:   Allergies as of 03/03/2018      Reactions   Aspirin Itching   Celebrex [celecoxib] Itching   itching   Ciprofloxacin Itching   Codeine Itching   Fosphenytoin Itching   Levaquin [levofloxacin In D5w] Itching   Levofloxacin Itching   Lovastatin Itching   Penicillins    Documentation indicates severe reaction Pt tolerated cephalosporin without adverse reaction 09/18   Pravastatin Itching  Sulfa Antibiotics Itching      Medication List    TAKE these medications   acidophilus Caps capsule Take 1 capsule by mouth daily.   albuterol 108 (90 Base) MCG/ACT inhaler Commonly known as:  PROVENTIL HFA;VENTOLIN HFA Inhale 2 puffs into the lungs every 4 (four) hours as needed for wheezing or shortness of breath.   albuterol (2.5 MG/3ML) 0.083% nebulizer solution Commonly known as:  PROVENTIL Take 3 mLs (2.5 mg total) by nebulization every 4 (four) hours as needed for wheezing or shortness of breath.    amLODipine 10 MG tablet Commonly known as:  NORVASC Take 1 tablet (10 mg total) by mouth daily.   citalopram 20 MG tablet Commonly known as:  CELEXA Take 1 tablet (20 mg total) by mouth daily.   clopidogrel 75 MG tablet Commonly known as:  PLAVIX Take 1 tablet (75 mg total) by mouth daily.   diltiazem 180 MG 24 hr capsule Commonly known as:  DILACOR XR Take 180 mg by mouth daily.   GLIPIZIDE XL 5 MG 24 hr tablet Generic drug:  glipiZIDE Take 1 tablet (5 mg total) by mouth daily.   lacosamide 200 MG Tabs tablet Commonly known as:  VIMPAT Take 1 tablet (200 mg total) by mouth 2 (two) times daily.   losartan 100 MG tablet Commonly known as:  COZAAR Take 1 tablet (100 mg total) by mouth daily. What changed:  how much to take   mometasone-formoterol 200-5 MCG/ACT Aero Commonly known as:  DULERA Inhale 2 puffs into the lungs 2 (two) times daily.   montelukast 10 MG tablet Commonly known as:  SINGULAIR Take 1 tablet by mouth at bedtime.   naproxen 500 MG tablet Commonly known as:  NAPROSYN Take 250 mg by mouth 2 (two) times daily as needed for mild pain or moderate pain.   omeprazole 20 MG capsule Commonly known as:  PRILOSEC Take 20 mg by mouth daily.   OXcarbazepine 150 MG tablet Commonly known as:  TRILEPTAL Take 1 tablet (150 mg total) by mouth 2 (two) times daily.   potassium chloride 10 MEQ tablet Commonly known as:  K-DUR Take 2 tablets (20 mEq total) by mouth 2 (two) times daily.   predniSONE 10 MG (21) Tbpk tablet Commonly known as:  STERAPRED UNI-PAK 21 TAB 6 tabs day 1 and taper 10 mg a day - 6 days   rOPINIRole 0.25 MG tablet Commonly known as:  REQUIP Take 1 tablet (0.25 mg total) by mouth 3 (three) times daily.   rosuvastatin 10 MG tablet Commonly known as:  CRESTOR Take 10 mg by mouth daily.     ASK your doctor about these medications   cephALEXin 500 MG capsule Commonly known as:  KEFLEX Take 1 capsule (500 mg total) by mouth 3 (three)  times daily for 5 days. Ask about: Should I take this medication?       Today   VITAL SIGNS:  Blood pressure (!) 132/54, pulse (!) 59, temperature 98 F (36.7 C), temperature source Oral, resp. rate 18, height _0  (1.6 m), weight 72.5 kg, SpO2 92 %.  I/O:  No intake or output data in the 24 hours ending 03/14/18 1221  PHYSICAL EXAMINATION:  Physical Exam  GENERAL:  67 y.o.-year-old patient lying in the bed with no acute distress.  LUNGS: Normal breath sounds bilaterally, no wheezing, rales,rhonchi or crepitation. No use of accessory muscles of respiration.  CARDIOVASCULAR: S1, S2 normal. No murmurs, rubs, or gallops.  ABDOMEN: Soft, non-tender, non-distended.  Bowel sounds present. No organomegaly or mass.  NEUROLOGIC: Moves all 4 extremities. PSYCHIATRIC: The patient is alert and oriented x 3.  SKIN: No obvious rash, lesion, or ulcer.   DATA REVIEW:   CBC No results for input(s): WBC, HGB, HCT, PLT in the last 168 hours.  Chemistries  No results for input(s): NA, K, CL, CO2, GLUCOSE, BUN, CREATININE, CALCIUM, MG, AST, ALT, ALKPHOS, BILITOT in the last 168 hours.  Invalid input(s): GFRCGP  Cardiac Enzymes No results for input(s): TROPONINI in the last 168 hours.  Microbiology Results  Results for orders placed or performed during the hospital encounter of 03/01/18  Blood Culture (routine x 2)     Status: None   Collection Time: 03/01/18 10:50 AM  Result Value Ref Range Status   Specimen Description BLOOD RIGHT AC  Final   Special Requests   Final    BOTTLES DRAWN AEROBIC AND ANAEROBIC Blood Culture results may not be optimal due to an excessive volume of blood received in culture bottles   Culture   Final    NO GROWTH 5 DAYS Performed at [facility], [address] [place], [place] [address]    Report Status 03/06/2018 FINAL  Final  Blood Culture (routine x 2)     Status: None   Collection Time: 03/01/18 11:10 AM  Result Value Ref Range Status    Specimen Description BLOOD LEFT WRIST  Final   Special Requests   Final    BOTTLES DRAWN AEROBIC AND ANAEROBIC Blood Culture results may not be optimal due to an excessive volume of blood received in culture bottles   Culture   Final    NO GROWTH 5 DAYS Performed at [facility], [address] [place], [place] [address]    Report Status 03/06/2018 FINAL  Final  MRSA PCR Screening     Status: Abnormal   Collection Time: 03/01/18  2:30 PM  Result Value Ref Range Status   MRSA by PCR POSITIVE (A) NEGATIVE Final    Comment:        The GeneXpert MRSA Assay (FDA approved for NASAL specimens only), is one component of a comprehensive MRSA colonization surveillance program. It is not intended to diagnose MRSA infection nor to guide or monitor treatment for MRSA infections. RESULT CALLED TO, READ BACK BY AND VERIFIED WITH: DANIELLE JACOB AT 6438 ON 03/01/18 BY SNJ Performed at [facility], [address], [place], [place] [address]     RADIOLOGY:  No results found.  Follow up with PCP in 1 week.  Management plans discussed with the patient, family and they are in agreement.  CODE STATUS:  Code Status History    Date Active Date Inactive Code Status Order ID Comments User Context   03/01/2018 1402 03/03/2018 1716 Full Code [id number]  Dustin Flock, MD ED   08/15/2017 0325 08/17/2017 1710 Full Code [id number]  Lance Coon, MD Inpatient   06/26/2017 0034 06/27/2017 1432 Full Code [id number]  Gorden Harms, MD Inpatient   06/15/2017 2015 06/17/2017 1627 Full Code [id number]  Fritzi Mandes, MD Inpatient   06/15/2017 1847 06/15/2017 2015 DNR [id number]  Fritzi Mandes, MD ED   05/04/2017 0835 05/07/2017 1832 DNR [id number]  Max Sane, MD Inpatient   04/29/2017 1915 05/04/2017 0835 Full Code [id number]  Angela Adam, RN Inpatient   03/25/2017 0235 03/30/2017 1907 Full Code [id number]  Harvie Bridge, DO Inpatient   02/11/2017 2131 02/12/2017 1636 Full Code [id number]   Demetrios Loll, MD Inpatient  07/11/2016 1131 07/17/2016 1508 Partial Code [id number]  Marijo Conception, RN Inpatient   07/11/2016 0237 07/11/2016 1131 Full Code [id number]  Harvie Bridge, DO Inpatient   03/22/2016 2244 03/24/2016 1912 Partial Code [id number]  Toy Baker, MD Inpatient   03/29/2015 1130 03/31/2015 1421 Partial Code [id number]  Loletha Grayer, MD ED    Advance Directive Documentation     Most Recent Value  Type of Advance Directive  Living will  Pre-existing out of facility DNR order (yellow form or pink MOST form)  -  "MOST" Form in Place?  -      TOTAL TIME TAKING CARE OF THIS PATIENT ON DAY OF DISCHARGE: more than 30 minutes.   Neita Carp M.D on 03/14/2018 at 12:21 PM  Between 7am to 6pm - Pager - [phone]  After 6pm go to [email or web address] - password [email or web address] [facility]  Office  [phone]  CC: Primary care physician; [facility], [facility]  Note: This dictation was prepared with Dragon dictation along with smaller phrase technology. Any transcriptional errors that result from this process are unintentional.

## 2018-06-15 ENCOUNTER — Other Ambulatory Visit: Payer: Self-pay | Admitting: Family Medicine

## 2018-06-15 DIAGNOSIS — Z1231 Encounter for screening mammogram for malignant neoplasm of breast: Secondary | ICD-10-CM

## 2018-11-30 ENCOUNTER — Other Ambulatory Visit: Payer: Self-pay | Admitting: Physician Assistant

## 2018-11-30 DIAGNOSIS — Z1231 Encounter for screening mammogram for malignant neoplasm of breast: Secondary | ICD-10-CM

## 2019-01-03 ENCOUNTER — Encounter: Payer: Self-pay | Admitting: Family Medicine

## 2019-01-12 ENCOUNTER — Encounter: Payer: Self-pay | Admitting: *Deleted

## 2019-01-26 ENCOUNTER — Ambulatory Visit: Payer: Medicare HMO | Admitting: Gastroenterology

## 2019-01-26 ENCOUNTER — Other Ambulatory Visit: Payer: Self-pay

## 2019-01-26 ENCOUNTER — Encounter: Payer: Self-pay | Admitting: Gastroenterology

## 2019-01-26 VITALS — BP 112/69 | HR 70 | Temp 98.7°F | Ht 63.0 in | Wt 161.0 lb

## 2019-01-26 DIAGNOSIS — K529 Noninfective gastroenteritis and colitis, unspecified: Secondary | ICD-10-CM | POA: Diagnosis not present

## 2019-01-26 DIAGNOSIS — M6289 Other specified disorders of muscle: Secondary | ICD-10-CM

## 2019-01-26 DIAGNOSIS — R159 Full incontinence of feces: Secondary | ICD-10-CM

## 2019-01-26 NOTE — Progress Notes (Signed)
[name] [address]  [place]  [place], [place] [address]  Main: [phone]  Fax: [phone]   Gastroenterology Consultation  Referring Provider:     Theotis Burrow* Primary Care Physician:  [facility], [facility] Reason for Consultation:    Diarrhea        HPI:    Chief Complaint  Patient presents with  . Diarrhea    [name] is a 68 y.o. y/o female referred for consultation & management  by Dr. Domingo Madeira, [facility].  Patient is deaf and interpreter is present.  She states she has been having diarrhea with 5 loose bowel movements a day for the last 1-1/2 years.  Also reports fecal incontinence frequently.  Sometimes does not feel a bowel movement coming and will have an accident.  This also occurs at night and she wakes up with stool in her pants.  No blood in her stool.  No weight loss.  No abdominal pain.  No nausea or vomiting.  No dysphagia.  No heartburn.  Has had 1 vaginal delivery and 1 C-section.  Reports fecal incontinence when she coughs.  Reports history of a colonoscopy at [facility] about 5 years ago.  States she was not having any diarrhea at the time.  There is a pathology report available from 2007 from an upper endoscopy as follows:  Diagnosis:  A: Small bowel, duodenum, third part, biopsy  - Duodenal mucosa with preserved villous architecture, no significant pathologic  abnormality.    B: Small bowel, duodenum, second part, biopsy  - Duodenal mucosa with preserved villous architecture, no significant pathologic  abnormality.  - Limited sampling of submucosal tissue.        Clinical History:  68 year old female with nausea. Upper GI endoscopy found a normal examined  esophagus. A few localized small non-bleeding erosions were found in the  stomach. A medium sized, soft, benign appearing mass with the endoscopic  appearance of lipoma was found in the second part of the  duodenum distal to the  ampulla. Biopsies were taken. The duodenal bulb and duodenal distal to the  second portion were normal. Biopsies were taken to assess for celiac sprue.   Past Medical History:  Diagnosis Date  . Asthma   . CHF (congestive heart failure) ([place])   . Cirrhosis, non-alcoholic ([place])   . COPD (chronic obstructive pulmonary disease) ([place])   . Deaf   . Depression   . Diabetes mellitus without complication ([place])   . GERD (gastroesophageal reflux disease)   . Heart murmur   . Hepatitis   . Hypertension   . Lymph node disorder    arm  . Neuropathy   . On home oxygen therapy    hs  . Orthopnea   . RLS (restless legs syndrome)   . Seizures ([place])   . Shortness of breath dyspnea   . Sleep apnea   . Stroke [facility])    tia    Past Surgical History:  Procedure Laterality Date  . CATARACT EXTRACTION W/PHACO Right 11/23/2014   Procedure: CATARACT EXTRACTION PHACO AND INTRAOCULAR LENS PLACEMENT (IOC);  Surgeon: Lyla Glassing, MD;  Location: [facility] ORS;  Service: Ophthalmology;  Laterality: Right;  . CATARACT EXTRACTION W/PHACO Left 12/14/2014   Procedure: CATARACT EXTRACTION PHACO AND INTRAOCULAR LENS PLACEMENT (IOC);  Surgeon: Lyla Glassing, MD;  Location: [facility] ORS;  Service: Ophthalmology;  Laterality: Left;  US:01:16.6 AP:15.8 CDE:12.14  . CESAREAN SECTION    . CHOLECYSTECTOMY    .  KNEE ARTHROPLASTY    . THUMB ARTHROSCOPY    . TONSILLECTOMY    . TYMPANOPLASTY     muliple    Prior to Admission medications   Medication Sig Start Date End Date Taking? Authorizing Provider  albuterol (PROVENTIL HFA;VENTOLIN HFA) 108 (90 BASE) MCG/ACT inhaler Inhale 2 puffs into the lungs every 4 (four) hours as needed for wheezing or shortness of breath.   Yes [provider]  albuterol (PROVENTIL) (2.5 MG/3ML) 0.083% nebulizer solution Take 3 mLs (2.5 mg total) by nebulization every 4 (four) hours as needed for wheezing or shortness of breath. 09/23/17  Yes Darylene Price A,  FNP  amLODipine (NORVASC) 10 MG tablet Take 1 tablet (10 mg total) by mouth daily. 08/18/17  Yes Salary, Avel Peace, MD  citalopram (CELEXA) 20 MG tablet Take 1 tablet (20 mg total) by mouth daily. 07/17/16  Yes Vaughan Basta, MD  clopidogrel (PLAVIX) 75 MG tablet Take 1 tablet (75 mg total) by mouth daily. 07/17/16  Yes Vaughan Basta, MD  diltiazem (DILACOR XR) 180 MG 24 hr capsule Take 180 mg by mouth daily.   Yes [provider]  GLIPIZIDE XL 5 MG 24 hr tablet Take 1 tablet (5 mg total) by mouth daily. 05/07/17  Yes Wieting, Richard, MD  lacosamide (VIMPAT) 200 MG TABS tablet Take 1 tablet (200 mg total) by mouth 2 (two) times daily. 05/07/17  Yes Wieting, Richard, MD  losartan (COZAAR) 100 MG tablet Take 1 tablet (100 mg total) by mouth daily. Patient taking differently: Take 25 mg by mouth daily.  08/18/17  Yes Salary, Avel Peace, MD  mometasone-formoterol (DULERA) 200-5 MCG/ACT AERO Inhale 2 puffs into the lungs 2 (two) times daily. 07/17/16  Yes Vaughan Basta, MD  montelukast (SINGULAIR) 10 MG tablet Take 1 tablet by mouth at bedtime. 03/18/16  Yes [provider]  naproxen (NAPROSYN) 500 MG tablet Take 250 mg by mouth 2 (two) times daily as needed for mild pain or moderate pain.   Yes [provider]  omeprazole (PRILOSEC) 20 MG capsule Take 20 mg by mouth daily.   Yes [provider]  OXcarbazepine (TRILEPTAL) 150 MG tablet Take 1 tablet (150 mg total) by mouth 2 (two) times daily. 06/17/17  Yes Vaughan Basta, MD  predniSONE (STERAPRED UNI-PAK 21 TAB) 10 MG (21) TBPK tablet 6 tabs day 1 and taper 10 mg a day - 6 days 03/03/18  Yes Sudini, Alveta Heimlich, MD  rosuvastatin (CRESTOR) 10 MG tablet Take 10 mg by mouth daily. 04/21/17  Yes [provider]  acidophilus (RISAQUAD) CAPS capsule Take 1 capsule by mouth daily. Patient not taking: Reported on 03/01/2018 06/27/17   Gladstone Lighter, MD  potassium chloride (K-DUR) 10 MEQ  tablet Take 2 tablets (20 mEq total) by mouth 2 (two) times daily. 08/26/17 03/01/18  Alisa Graff, FNP  rOPINIRole (REQUIP) 0.25 MG tablet Take 1 tablet (0.25 mg total) by mouth 3 (three) times daily. Patient not taking: Reported on 01/26/2019 03/24/16   Oswald Hillock, MD    Family History  Problem Relation Age of Onset  . Lung cancer Mother   . CAD Father      Social History   Tobacco Use  . Smoking status: Current Every Day Smoker    Packs/day: 0.50    Years: 50.00    Pack years: 25.00    Types: Cigarettes    Start date: 03/01/1968  . Smokeless tobacco: Never Used  Substance Use Topics  . Alcohol use: No  .  Drug use: No    Allergies as of 01/26/2019 - Review Complete 01/26/2019  Allergen Reaction Noted  . Aspirin Itching 11/21/2014  . Celebrex [celecoxib] Itching 11/21/2014  . Ciprofloxacin Itching 11/21/2014  . Codeine Itching 11/21/2014  . Fosphenytoin Itching 11/21/2014  . Levaquin [levofloxacin in d5w] Itching 11/21/2014  . Levofloxacin Itching 12/01/2013  . Lovastatin Itching 07/11/2016  . Penicillins  11/21/2014  . Pravastatin Itching 07/11/2016  . Sulfa antibiotics Itching 11/21/2014    Review of Systems:    All systems reviewed and negative except where noted in HPI.   Physical Exam:  BP 112/69   Pulse 70   Temp 98.7 F (37.1 C) (Oral)   Ht _0  (1.6 m)   Wt 161 lb (73 kg)   BMI 28.52 kg/m  No LMP recorded. Patient is postmenopausal. Psych:  Alert and cooperative. Normal mood and affect. General:   Alert,  Well-developed, well-nourished, pleasant and cooperative in NAD Head:  Normocephalic and atraumatic. Eyes:  Sclera clear, no icterus.   Conjunctiva pink. Ears:  Normal auditory acuity. Nose:  No deformity, discharge, or lesions. Mouth:  No deformity or lesions,oropharynx pink & moist. Neck:  Supple; no masses or thyromegaly. Abdomen:  Normal bowel sounds.  No bruits.  Soft, non-tender and non-distended without masses, hepatosplenomegaly or  hernias noted.  No guarding or rebound tenderness.    Msk:  Symmetrical without gross deformities. Good, equal movement & strength bilaterally. Pulses:  Normal pulses noted. Extremities:  No clubbing or edema.  No cyanosis. Neurologic:  Alert and oriented x3;  grossly normal neurologically. Skin:  Intact without significant lesions or rashes. No jaundice. Lymph Nodes:  No significant cervical adenopathy. Psych:  Alert and cooperative. Normal mood and affect.   Labs: CBC    Component Value Date/Time   WBC 9.7 03/02/2018 0415   RBC 5.21 (H) 03/02/2018 0415   HGB 14.9 03/02/2018 0415   HGB 15.3 11/05/2013 1138   HCT 43.1 03/02/2018 0415   HCT 44.8 11/05/2013 1138   PLT 217 03/02/2018 0415   PLT 214 11/05/2013 1138   MCV 82.7 03/02/2018 0415   MCV 85 11/05/2013 1138   MCH 28.7 03/02/2018 0415   MCHC 34.7 03/02/2018 0415   RDW 14.0 03/02/2018 0415   RDW 13.3 11/05/2013 1138   LYMPHSABS 1.9 08/14/2017 1622   LYMPHSABS 1.4 04/24/2013 0614   MONOABS 0.4 08/14/2017 1622   MONOABS 0.2 04/24/2013 0614   EOSABS 0.1 08/14/2017 1622   EOSABS 0.0 04/24/2013 0614   BASOSABS 0.0 08/14/2017 1622   BASOSABS 0.0 04/24/2013 0614   CMP     Component Value Date/Time   NA 138 03/03/2018 0925   NA 139 11/05/2013 1138   K 4.4 03/03/2018 0925   K 3.3 (L) 11/05/2013 1138   CL 100 03/03/2018 0925   CL 103 11/05/2013 1138   CO2 28 03/03/2018 0925   CO2 29 11/05/2013 1138   GLUCOSE 234 (H) 03/03/2018 0925   GLUCOSE 219 (H) 11/05/2013 1138   BUN 21 03/03/2018 0925   BUN 9 11/05/2013 1138   CREATININE 0.79 03/03/2018 0925   CREATININE 0.35 (L) 11/05/2013 1138   CALCIUM 8.8 (L) 03/03/2018 0925   CALCIUM 8.9 11/05/2013 1138   PROT 7.3 08/14/2017 1622   PROT 7.0 11/05/2013 1138   ALBUMIN 3.9 08/14/2017 1622   ALBUMIN 3.4 11/05/2013 1138   AST 31 08/14/2017 1622   AST 48 (H) 11/05/2013 1138   ALT 17 08/14/2017 1622   ALT 35 11/05/2013 1138  ALKPHOS 172 (H) 08/14/2017 1622   ALKPHOS 145  (H) 11/05/2013 1138   BILITOT 0.8 08/14/2017 1622   BILITOT 0.3 11/05/2013 1138   GFRNONAA >60 03/03/2018 0925   GFRNONAA >60 11/05/2013 1138   GFRAA >60 03/03/2018 0925   GFRAA >60 11/05/2013 1138    Imaging Studies: No results found.  Assessment and Plan:   TREASE BREMNER is a 68 y.o. y/o female has been referred for diarrhea  Chronic diarrhea for 1-1/2 years Signs and symptoms most consistent with pelvic floor dysfunction causing fecal incontinence.  Will refer for pelvic floor physical therapy.  Patient agreeable  We will try to contact their office to see if they can assist her in finding out how much to physical therapy will cost her before she goes there as she is a unable to call herself.  Due to new onset diarrhea since her last colonoscopy and no recent procedures, patient is willing to undergo colonoscopy for biopsies of microscopic colitis as well screening.  I have discussed alternative options, risks & benefits,  which include, but are not limited to, bleeding, infection, perforation,respiratory complication & drug reaction.  The patient agrees with this plan & written consent will be obtained.    Patient does not intake foods or drinks with sugar more sugar substitutes.  I have asked her to start taking Metamucil to help bulk stool.   Dr [name]  Speech recognition software was used to dictate the above note.

## 2019-01-26 NOTE — Progress Notes (Signed)
amb  

## 2019-02-09 ENCOUNTER — Ambulatory Visit: Payer: Medicare HMO | Attending: Gastroenterology | Admitting: Physical Therapy

## 2019-02-09 ENCOUNTER — Other Ambulatory Visit: Payer: Self-pay

## 2019-02-09 DIAGNOSIS — R2689 Other abnormalities of gait and mobility: Secondary | ICD-10-CM | POA: Insufficient documentation

## 2019-02-09 DIAGNOSIS — M533 Sacrococcygeal disorders, not elsewhere classified: Secondary | ICD-10-CM | POA: Insufficient documentation

## 2019-02-09 DIAGNOSIS — M6281 Muscle weakness (generalized): Secondary | ICD-10-CM | POA: Diagnosis present

## 2019-02-09 NOTE — Patient Instructions (Signed)
Open book ( handout)   Fill out food diary

## 2019-02-10 NOTE — Therapy (Addendum)
[place] MAIN [facility] SERVICES [address] [place], [place], [address] Phone: [phone]   Fax:  [phone]  Physical Therapy Evaluation  Patient Details  Name: [name] MRN: [id number] Date of Birth: 13-Mar-1951 Referring Provider (PT): Bonna Gains    Encounter Date: 02/09/2019    Past Medical History:  Diagnosis Date  . Asthma   . CHF (congestive heart failure) ([place])   . Cirrhosis, non-alcoholic ([place])   . COPD (chronic obstructive pulmonary disease) ([place])   . Deaf   . Depression   . Diabetes mellitus without complication ([place])   . GERD (gastroesophageal reflux disease)   . Heart murmur   . Hepatitis   . Hypertension   . Lymph node disorder    arm  . Neuropathy   . On home oxygen therapy    hs  . Orthopnea   . RLS (restless legs syndrome)   . Seizures ([place])   . Shortness of breath dyspnea   . Sleep apnea   . Stroke [facility])    tia    Past Surgical History:  Procedure Laterality Date  . CATARACT EXTRACTION W/PHACO Right 11/23/2014   Procedure: CATARACT EXTRACTION PHACO AND INTRAOCULAR LENS PLACEMENT (IOC);  Surgeon: Lyla Glassing, MD;  Location: [facility] ORS;  Service: Ophthalmology;  Laterality: Right;  . CATARACT EXTRACTION W/PHACO Left 12/14/2014   Procedure: CATARACT EXTRACTION PHACO AND INTRAOCULAR LENS PLACEMENT (IOC);  Surgeon: Lyla Glassing, MD;  Location: [facility] ORS;  Service: Ophthalmology;  Laterality: Left;  US:01:16.6 AP:15.8 CDE:12.14  . CESAREAN SECTION    . CHOLECYSTECTOMY    . KNEE ARTHROPLASTY    . THUMB ARTHROSCOPY    . TONSILLECTOMY    . TYMPANOPLASTY     muliple    There were no vitals filed for this visit.   Subjective Assessment - 02/14/19 0845    Subjective 1)  B LBP radiating pain down back of legs to foot 10/10 pain level. This started 10 years ago which has worsened. Pain with getting up from sitting to standing and walking. Pt has been told she has restless leg syndrome.   2)  fecal  incontinence: Started one year ago. Frequent trips to the bathroom for diarrhea, 2 x within 2 hours. Pt does not eat before going to the doctors or else she is scared she has to go. Pt has to wait to take her medications after doctors visits.  Daily w/ fluid intake: 3 glasses of water, 1/2 cup of coffee, 2 sodas 12 fl cans, 1 cup juice. Pt skips lunch.  Pt eats sausges and eggs for breakfast daily and afterwards has to run to the bathroom.  Pt lives with her son/ his girlfriend and he makes her eat vegetables. Pt's son does not want her to cook because she burns everything and has Hx of seizures. Pt waits for son to come home before she eats.     Hx of fall onto tailbone 2 years ago.  No pain at the tailbone. Gyn Hx: 2 children, 1 vaginal delivery, 1 C-section. Abdominal surgeries: C-section x 1 , gallbladder surgery with large scar.     Patient is accompained by:  Interpreter   hearing impaired   Pertinent History  Anxiety, Depression, heart Disease, Diabetes, HBP, Mild Stroke, RA, seizures, HA, Bronchitiits, Kidney Diseases.  Has COPD but only uses her O2 machine sometimes.         [facility] PT Assessment - 02/14/19 0845      Assessment   Medical  Diagnosis  pelvic floor dysfunction     Referring Provider (PT)  Tahiliani       Precautions   Precaution Comments  has COPD , not with her O2 machine in clinic today. Presenting with SOB with mask on walk from waiting room to office       Restrictions   Weight Bearing Restrictions  No      Home Environment   Additional Comments  lives with son and his girlfriend       Prior Function   Level of Independence  Independent      Observation/Other Assessments   Observations  slumped posture, SOB after walking from waiting room to office      Coordination   Gross Motor Movements are Fluid and Coordinated  --   chest breathing     AROM   Overall AROM Comments  radiating pain with B sideflexion, limited rotation of trunk        Strength    Overall Strength Comments  hip/ knee flex B 3/5, knee xt 4-/5 B       Palpation   Spinal mobility  significant hypomobility at thoracic and lumbar/pelvic junction    Palpation comment  increased mm tensions at QL, paraspinals . Abdominal scars minor restrictions                 Objective measurements completed on examination: See above findings.      [place] Adult PT Treatment/Exercise - 02/14/19 0847      Posture/Postural Control   Posture Comments  dyscoordinatino of deep core       Therapeutic Activites    Therapeutic Activities  --   explained to complete food diary, POC , HEP     Manual Therapy   Manual therapy comments  long axis distraction, PA mob at L SIJ, STM/ MWM at QL, paraspinals           PT Long Term Goals - 02/09/19 1126      PT LONG TERM GOAL #1   Title  Pt will have decreased frequent trips to the bathroom from 2 x within 2 hours to 1 x within 2 hours in order to eat in the morning and not delay taking her medications to go to the bathroom    Time  6    Period  Weeks    Status  New    Target Date  03/23/19      PT LONG TERM GOAL #2   Title  Pt will demo decreased LBP pain from 10/10 to < 5/10 with sit-stand and walking in order to perform ADLs    Time  8    Period  Weeks    Status  New    Target Date  04/06/19      PT LONG TERM GOAL #3   Title  Pt will demo increased mobility at T/L and L/S junction to optimize digestion/ pelvic floor function and postural stability    Time  4    Period  Weeks    Status  New    Target Date  03/14/19      PT LONG TERM GOAL #4   Title  Pt will demo proper deep core coordination and strengthening Level 1-2 exercises in order to promote motility for less diarrhea    Time  10    Period  Weeks    Status  New    Target Date  04/20/19      PT LONG TERM GOAL #  5   Title  Pt will increase her hip/ LE strength B from hip/ knee flex B 3/5, knee ext 4-/5 B  to overall 4+/5 to promote stability for sit to stand,  walking and minimize radiating LBP    Baseline  hip/ knee flex B 3/5, knee ext 4-/5 B    Time  10    Period  Weeks    Status  New    Target Date  04/20/19                  Plan - 02/14/19 0853    Clinical Impression Statement  Pt is 68 yo female who reports of B radiating LBP and fecal incontinence which impact her QOL. Clinical presentations include limited mobility at thoracic/lumbar and lumbopelvic junction, mildly restricted abdominal scars, weakness of hips/ legs, and dyscoordination of deep core system, sacroiliac hypomobility.  These are deficits that indicate an ineffective intraabdominal pressure system associated with LBP and fecal incontinence.   Contributing Factors to deep core system: Hx of fall onto tailbone 2 years ago. Gyn Hx: 2 children, 1 vaginal delivery, 1 C-section. Abdominal surgeries: C-section x 1 , gallbladder surgery with large scar.   Pt was provided education on etiology of Sx with anatomy, physiology explanation with images along with the benefits of customized pelvic PT Tx based on pt's medical conditions and musculoskeletal deficits.  Explained the physiology of deep core mm coordination and roles of pelvic floor function in urination, defecation, sexual function, and postural control with deep core mm system.   Following Tx today which pt tolerated without complaints, pt demo'd equal alignment of pelvic girdle and increased spinal mobility. Pt reported back pain decreased from 10/10 to 7/10 post Tx.    Personal Factors and Comorbidities  Age;Comorbidity 3+    Comorbidities  Anxiety, Depression, heart Disease, Diabetes, HBP, Mild Stroke, RA, seizures, HA, Bronchitiits, Kidney Diseases.  Has COPD but only uses her O2 machine sometimes.    Examination-Activity Limitations  Lift;Transfers;Stand;Locomotion Level    Stability/Clinical Decision Making  Evolving/Moderate complexity    Rehab Potential  Good    PT Frequency  1x / week    PT Duration  Other  (comment)   10   PT Treatment/Interventions  Moist Heat;Traction;Gait training;Neuromuscular re-education;Patient/family education;Therapeutic activities;Therapeutic exercise;Manual lymph drainage;Manual techniques;Scar mobilization;Taping;Splinting;Balance training    Consulted and Agree with Plan of Care  Patient       Patient will benefit from skilled therapeutic intervention in order to improve the following deficits and impairments:  Abnormal gait, Decreased mobility, Decreased endurance, Difficulty walking, Decreased range of motion, Decreased coordination, Increased muscle spasms, Hypomobility, Improper body mechanics, Pain, Postural dysfunction, Decreased activity tolerance  Visit Diagnosis: 1. Sacrococcygeal disorders, not elsewhere classified   2. Other abnormalities of gait and mobility   3. Muscle weakness (generalized)        Problem List Patient Active Problem List   Diagnosis Date Noted  . PNA (pneumonia) 03/01/2018  . Bronchitis 09/23/2017  . Chronic diastolic heart failure ([place]) 08/26/2017  . Tobacco use 08/26/2017  . Seizures ([place]) 06/15/2017  . Goals of care, counseling/discussion 05/06/2017  . Status epilepticus ([place])   . CAP (community acquired pneumonia)   . Hypoxia   . Hypokalemia 02/12/2017  . GERD (gastroesophageal reflux disease) 03/23/2016  . Depression 03/23/2016  . DM (diabetes mellitus), type 2 ([place]) 03/22/2016  . Essential hypertension 03/22/2016  . Neuropathy 03/22/2016  . Convulsions ([place]) 03/22/2016  . COPD exacerbation ([place]) 03/31/2015  Jerl Mina ,PT, DPT, E-RYT  02/14/2019, 8:59 AM  [place] MAIN [facility] SERVICES [address] [place], [place], [address] Phone: [phone]   Fax:  [phone]  Name: PAMMIE CHIRINO MRN: [id number] Date of Birth: 1950-12-24

## 2019-02-11 ENCOUNTER — Other Ambulatory Visit: Payer: Self-pay

## 2019-02-11 ENCOUNTER — Other Ambulatory Visit
Admission: RE | Admit: 2019-02-11 | Discharge: 2019-02-11 | Disposition: A | Discharge status: Home / Self Care | Payer: Medicare HMO | Source: Ambulatory Visit | Attending: Gastroenterology | Admitting: Gastroenterology

## 2019-02-11 DIAGNOSIS — Z20828 Contact with and (suspected) exposure to other viral communicable diseases: Secondary | ICD-10-CM | POA: Diagnosis not present

## 2019-02-11 DIAGNOSIS — Z01812 Encounter for preprocedural laboratory examination: Secondary | ICD-10-CM | POA: Insufficient documentation

## 2019-02-12 LAB — SARS CORONAVIRUS 2 (TAT 6-24 HRS): SARS Coronavirus 2: NEGATIVE

## 2019-02-14 NOTE — Addendum Note (Signed)
Addended by: Jerl Mina on: 02/14/2019 10:51 AM   Modules accepted: Orders

## 2019-02-15 ENCOUNTER — Ambulatory Visit: Payer: Medicare HMO | Admitting: Physical Therapy

## 2019-02-15 ENCOUNTER — Other Ambulatory Visit: Payer: Self-pay

## 2019-02-15 DIAGNOSIS — M533 Sacrococcygeal disorders, not elsewhere classified: Secondary | ICD-10-CM

## 2019-02-15 DIAGNOSIS — R2689 Other abnormalities of gait and mobility: Secondary | ICD-10-CM

## 2019-02-15 DIAGNOSIS — M6281 Muscle weakness (generalized): Secondary | ICD-10-CM

## 2019-02-15 NOTE — Patient Instructions (Signed)
Open book ( handout)  To increased midback mobility  15 reps x 2 x day   ____________________  [name] 45 Degrees to increase hip strength    Lying with hips and knees bent 45, one pillow between knees and ankles. Lift knee with exhale. Be sure pelvis does not roll backward. Do not arch back. Do 15 times, each leg, 2 times per day.     Complimentary stretch: [employer] _ foot over _ thigh, opposite knee straight  3 breaths  * Keep pelvis levelled with tactile cue with hand under back of hips  * Slide the ankle of the supporting foot out to decrease the angle which can help level the pelvis   Cross thigh over thigh, exhale to hug the thighs in with arms pulling back of thigh, shoulders/ head is relaxed down , Use towel behind thigh is needed.  10 reps  Cross _ ankle overopposite thigh, opposite knee bent, foot on bed ( Figure-4)  exhale to hug the thighs in with arms pulling back of thigh, shoulders/ head is relaxed down , Use towel behind thigh is needed.  10 reps   ______  Feet Toes up and swing ankle out   15 reps

## 2019-02-15 NOTE — Therapy (Signed)
[place] MAIN [facility] SERVICES [address] [place], [place], [address] Phone: [phone]   Fax:  [phone]  Physical Therapy Treatment  Patient Details  Name: [name] MRN: [id number] Date of Birth: 1951-03-15 Referring Provider (PT): Bonna Gains    Encounter Date: 02/15/2019  PT End of Session - 02/15/19 1723    Visit Number  2    Number of Visits  10    Date for PT Re-Evaluation  04/20/19    PT Start Time  1638    PT Stop Time  1600    PT Time Calculation (min)  75 min    Activity Tolerance  Patient tolerated treatment well;No increased pain    Behavior During Therapy  WFL for tasks assessed/performed       Past Medical History:  Diagnosis Date  . Asthma   . CHF (congestive heart failure) ([place])   . Cirrhosis, non-alcoholic ([place])   . COPD (chronic obstructive pulmonary disease) ([place])   . Deaf   . Depression   . Diabetes mellitus without complication ([place])   . GERD (gastroesophageal reflux disease)   . Heart murmur   . Hepatitis   . Hypertension   . Lymph node disorder    arm  . Neuropathy   . On home oxygen therapy    hs  . Orthopnea   . RLS (restless legs syndrome)   . Seizures ([place])   . Shortness of breath dyspnea   . Sleep apnea   . Stroke [facility])    tia    Past Surgical History:  Procedure Laterality Date  . CATARACT EXTRACTION W/PHACO Right 11/23/2014   Procedure: CATARACT EXTRACTION PHACO AND INTRAOCULAR LENS PLACEMENT (IOC);  Surgeon: Lyla Glassing, MD;  Location: [facility] ORS;  Service: Ophthalmology;  Laterality: Right;  . CATARACT EXTRACTION W/PHACO Left 12/14/2014   Procedure: CATARACT EXTRACTION PHACO AND INTRAOCULAR LENS PLACEMENT (IOC);  Surgeon: Lyla Glassing, MD;  Location: [facility] ORS;  Service: Ophthalmology;  Laterality: Left;  US:01:16.6 AP:15.8 CDE:12.14  . CESAREAN SECTION    . CHOLECYSTECTOMY    . KNEE ARTHROPLASTY    . THUMB ARTHROSCOPY    . TONSILLECTOMY    . TYMPANOPLASTY     muliple     There were no vitals filed for this visit.  Subjective Assessment - 02/15/19 1446    Subjective  Pt reports she no longer has radiating pain from her L LBP to foot . But her L foot still hurts and her R leg still has the radiating pain.    Patient is accompained by:  Interpreter   hearing impaired   Pertinent History  Anxiety, Depression, heart Disease, Diabetes, HBP, Mild Stroke, RA, seizures, HA, Bronchitiits, Kidney Diseases.  Has COPD but only uses her O2 machine sometimes.         [facility] PT Assessment - 02/15/19 1543      PROM   Overall PROM Comments  pre Tx: pain at hip ext 0 deg, post Tx: no pain at hip ext ~5 deg  R       Palpation   Spinal mobility  R shoulder slightly lower,     SI assessment   B PSIS levelled and equal,       Palpation comment  significantly limited nutation on R SIJ,  increased tightness along paraspinals B, hypomobile T-spine with tenderness at T12 .  tightness at plantar fascia B, tenderness at calcaneus, tightenss at transverse arch B ( decreased post Tx)  [place] Adult PT Treatment/Exercise - 02/15/19 1543      Bed Mobility   Bed Mobility  --   had lift ( cued for log rolling)      Therapeutic Activites    Therapeutic Activities  --   guided relaxation     Neuro Re-ed    Neuro Re-ed Details   cued for proper technique of new HEP to mininize overuse of upper trap. Cued for less heel striking, increased hip flexion in gait        Modalities   Modalities  Moist Heat      Moist Heat Therapy   Number Minutes Moist Heat  10 Minutes    Moist Heat Location  --   gluts , applied during Tx at feet      Manual Therapy   Manual therapy comments  PA mob, medial glide at R SIJ, STM/ MWM at mm noted in assessment. downgraded manual Tx to soft/ gentle jostle to minimzie pain,. Pt tolerated downgrade of manual Tx without increased pain.                   PT Long Term Goals - 02/09/19 1126      PT LONG TERM  GOAL #1   Title  Pt will have decreased frequent trips to the bathroom from 2 x within 2 hours to 1 x within 2 hours in order to eat in the morning and not delay taking her medications to go to the bathroom    Time  6    Period  Weeks    Status  New    Target Date  03/23/19      PT LONG TERM GOAL #2   Title  Pt will demo decreased LBP pain from 10/10 to < 5/10 with sit-stand and walking in order to perform ADLs    Time  8    Period  Weeks    Status  New    Target Date  04/06/19      PT LONG TERM GOAL #3   Title  Pt will demo increased mobility at T/L and L/S junction to optimize digestion/ pelvic floor function and postural stability    Time  4    Period  Weeks    Status  New    Target Date  03/14/19      PT LONG TERM GOAL #4   Title  Pt will demo proper deep core coordination and strengthening Level 1-2 exercises in order to promote motility for less diarrhea    Time  10    Period  Weeks    Status  New    Target Date  04/20/19      PT LONG TERM GOAL #5   Title  Pt will increase her hip/ LE strength B from hip/ knee flex B 3/5, knee ext 4-/5 B  to overall 4+/5 to promote stability for sit to stand, walking and minimize radiating LBP    Baseline  hip/ knee flex B 3/5, knee ext 4-/5 B    Time  10    Period  Weeks    Status  New    Target Date  04/20/19            Plan - 02/15/19 1723    Clinical Impression Statement  Pt responded well to last session with report of resolved L radiating LBP. Addressed today remaining R radiating LBP and heel pain with manual Tx to increase R SIJ and B paraspinal mm,  intrinsic feet mobility. Modified manual Tx to gentler pressure techniques to minimize increased pain. Pt tolerated Tx today and demo'd increased R hip ext without radiating pain. Pt reported walking without heel pain and no radiating pain down RLE post Tx. Further address fecal incontinence at upcoming session. Pt continues to benefit from skilled PT.    Personal Factors and  Comorbidities  Age;Comorbidity 3+    Comorbidities  Anxiety, Depression, heart Disease, Diabetes, HBP, Mild Stroke, RA, seizures, HA, Bronchitiits, Kidney Diseases.  Has COPD but only uses her O2 machine sometimes.    Examination-Activity Limitations  Lift;Transfers;Stand;Locomotion Level    Stability/Clinical Decision Making  Evolving/Moderate complexity    Rehab Potential  Good    PT Frequency  1x / week    PT Duration  Other (comment)   10   PT Treatment/Interventions  Moist Heat;Traction;Gait training;Neuromuscular re-education;Patient/family education;Therapeutic activities;Therapeutic exercise;Manual lymph drainage;Manual techniques;Scar mobilization;Taping;Splinting;Balance training    Consulted and Agree with Plan of Care  Patient       Patient will benefit from skilled therapeutic intervention in order to improve the following deficits and impairments:  Abnormal gait, Decreased mobility, Decreased endurance, Difficulty walking, Decreased range of motion, Decreased coordination, Increased muscle spasms, Hypomobility, Improper body mechanics, Pain, Postural dysfunction, Decreased activity tolerance  Visit Diagnosis: 1. Sacrococcygeal disorders, not elsewhere classified   2. Other abnormalities of gait and mobility   3. Muscle weakness (generalized)        Problem List Patient Active Problem List   Diagnosis Date Noted  . PNA (pneumonia) 03/01/2018  . Bronchitis 09/23/2017  . Chronic diastolic heart failure ([place]) 08/26/2017  . Tobacco use 08/26/2017  . Seizures ([place]) 06/15/2017  . Goals of care, counseling/discussion 05/06/2017  . Status epilepticus ([place])   . CAP (community acquired pneumonia)   . Hypoxia   . Hypokalemia 02/12/2017  . GERD (gastroesophageal reflux disease) 03/23/2016  . Depression 03/23/2016  . DM (diabetes mellitus), type 2 ([place]) 03/22/2016  . Essential hypertension 03/22/2016  . Neuropathy 03/22/2016  . Convulsions ([place]) 03/22/2016  . COPD  exacerbation ([place]) 03/31/2015    Jerl Mina ,PT, DPT, E-RYT  02/15/2019, 6:38 PM  [place] MAIN [facility] SERVICES [address] [place], [place], [address] Phone: [phone]   Fax:  [phone]  Name: LILIAN FUHS MRN: [id number] Date of Birth: [birth year]

## 2019-02-16 ENCOUNTER — Encounter: Payer: Self-pay | Admitting: Emergency Medicine

## 2019-02-16 ENCOUNTER — Encounter
Admission: RE | Disposition: A | Discharge status: Home / Self Care | Payer: Self-pay | Source: Home / Self Care | Attending: Gastroenterology

## 2019-02-16 ENCOUNTER — Ambulatory Visit
Admission: RE | Admit: 2019-02-16 | Discharge: 2019-02-16 | Disposition: A | Discharge status: Home / Self Care | Payer: Medicare HMO | Attending: Gastroenterology | Admitting: Gastroenterology

## 2019-02-16 ENCOUNTER — Ambulatory Visit: Payer: Medicare HMO | Admitting: Certified Registered"

## 2019-02-16 DIAGNOSIS — Z7984 Long term (current) use of oral hypoglycemic drugs: Secondary | ICD-10-CM | POA: Diagnosis not present

## 2019-02-16 DIAGNOSIS — K529 Noninfective gastroenteritis and colitis, unspecified: Secondary | ICD-10-CM

## 2019-02-16 DIAGNOSIS — G4730 Sleep apnea, unspecified: Secondary | ICD-10-CM | POA: Diagnosis not present

## 2019-02-16 DIAGNOSIS — Z539 Procedure and treatment not carried out, unspecified reason: Secondary | ICD-10-CM | POA: Insufficient documentation

## 2019-02-16 DIAGNOSIS — Z9981 Dependence on supplemental oxygen: Secondary | ICD-10-CM | POA: Diagnosis not present

## 2019-02-16 DIAGNOSIS — E1140 Type 2 diabetes mellitus with diabetic neuropathy, unspecified: Secondary | ICD-10-CM | POA: Insufficient documentation

## 2019-02-16 DIAGNOSIS — I509 Heart failure, unspecified: Secondary | ICD-10-CM | POA: Insufficient documentation

## 2019-02-16 DIAGNOSIS — I110 Hypertensive heart disease with heart failure: Secondary | ICD-10-CM | POA: Diagnosis not present

## 2019-02-16 DIAGNOSIS — H9190 Unspecified hearing loss, unspecified ear: Secondary | ICD-10-CM | POA: Insufficient documentation

## 2019-02-16 DIAGNOSIS — J449 Chronic obstructive pulmonary disease, unspecified: Secondary | ICD-10-CM | POA: Insufficient documentation

## 2019-02-16 DIAGNOSIS — Z8673 Personal history of transient ischemic attack (TIA), and cerebral infarction without residual deficits: Secondary | ICD-10-CM | POA: Diagnosis not present

## 2019-02-16 DIAGNOSIS — F17200 Nicotine dependence, unspecified, uncomplicated: Secondary | ICD-10-CM | POA: Insufficient documentation

## 2019-02-16 SURGERY — COLONOSCOPY WITH PROPOFOL
Anesthesia: General

## 2019-02-16 MED ORDER — SODIUM CHLORIDE 0.9 % IV SOLN: INTRAVENOUS | Status: DC

## 2019-02-16 MED ORDER — PROPOFOL 500 MG/50ML IV EMUL
INTRAVENOUS | Status: AC
  Filled 2019-02-16: qty 50

## 2019-02-16 NOTE — Anesthesia Preprocedure Evaluation (Addendum)
Anesthesia Evaluation  Patient identified by MRN, date of birth, ID band Patient awake    Reviewed: Allergy & Precautions, NPO status , Patient's Chart, lab work & pertinent test results  History of Anesthesia Complications Negative for: history of anesthetic complications  Airway Mallampati: II  TM Distance: >3 FB Neck ROM: Full    Dental  (+) Upper Dentures, Lower Dentures   Pulmonary asthma , sleep apnea , COPD,  oxygen dependent, Current Smoker,    breath sounds clear to auscultation- rhonchi (-) wheezing      Cardiovascular hypertension, Pt. on medications +CHF (preserved EF)  (-) CAD, (-) Past MI, (-) Cardiac Stents and (-) CABG  Rhythm:Regular Rate:Normal - Systolic murmurs and - Diastolic murmurs Echo 02/20/70: NORMAL LEFT VENTRICULAR SYSTOLIC FUNCTION WITH AN ESTIMATED EF = 50-55 % NORMAL RIGHT VENTRICULAR SYSTOLIC FUNCTION MILD-TO-MODERATE TRICUSPID AND MITRAL VALVE INSUFFICIENCY TRACE AORTIC VALVE INSUFFICIENCY MILD AORTIC VALVE STENOSIS WITH A CALCULATED AVA = 1.6 cm^2 BY CONTINUITY EQUATION MILD LA ENLARGEMENT MILD LVH   Neuro/Psych Seizures -, Well Controlled,  PSYCHIATRIC DISORDERS Depression CVA, Residual Symptoms    GI/Hepatic GERD  ,(+) Hepatitis -  Endo/Other  diabetes, Oral Hypoglycemic Agents  Renal/GU negative Renal ROS     Musculoskeletal negative musculoskeletal ROS (+)   Abdominal (+) - obese,   Peds  Hematology negative hematology ROS (+)   Anesthesia Other Findings Past Medical History: No date: Asthma No date: CHF (congestive heart failure) (HCC) No date: Cirrhosis, non-alcoholic (HCC) No date: COPD (chronic obstructive pulmonary disease) (HCC) No date: Deaf No date: Depression No date: Diabetes mellitus without complication (HCC) No date: GERD (gastroesophageal reflux disease) No date: Heart murmur No date: Hepatitis No date: Hypertension No date: Lymph node disorder      Comment:  arm No date: Neuropathy No date: On home oxygen therapy     Comment:  hs No date: Orthopnea No date: RLS (restless legs syndrome) No date: Seizures (HCC) No date: Shortness of breath dyspnea No date: Sleep apnea No date: Stroke [facility])     Comment:  tia   Reproductive/Obstetrics                            Anesthesia Physical Anesthesia Plan  ASA: III  Anesthesia Plan: General   Post-op Pain Management:    Induction: Intravenous  PONV Risk Score and Plan: 1 and Propofol infusion  Airway Management Planned: Natural Airway  Additional Equipment:   Intra-op Plan:   Post-operative Plan:   Informed Consent: I have reviewed the patients History and Physical, chart, labs and discussed the procedure including the risks, benefits and alternatives for the proposed anesthesia with the patient or authorized representative who has indicated his/her understanding and acceptance.     Dental advisory given  Plan Discussed with: CRNA and Anesthesiologist  Anesthesia Plan Comments:         Anesthesia Quick Evaluation

## 2019-02-23 ENCOUNTER — Other Ambulatory Visit: Payer: Self-pay

## 2019-02-23 ENCOUNTER — Ambulatory Visit: Payer: Medicare HMO | Admitting: Physical Therapy

## 2019-02-23 VITALS — HR 64

## 2019-02-23 DIAGNOSIS — R2689 Other abnormalities of gait and mobility: Secondary | ICD-10-CM

## 2019-02-23 DIAGNOSIS — M6281 Muscle weakness (generalized): Secondary | ICD-10-CM

## 2019-02-23 DIAGNOSIS — M533 Sacrococcygeal disorders, not elsewhere classified: Secondary | ICD-10-CM | POA: Diagnosis not present

## 2019-02-23 NOTE — Therapy (Signed)
[place] MAIN [facility] SERVICES [address] [place], [place], [address] Phone: [phone]   Fax:  [phone]  Physical Therapy Treatment  Patient Details  Name: [name] MRN: [id number] Date of Birth: 09-Apr-1951 Referring Provider (PT): Bonna Gains    Encounter Date: 02/23/2019  PT End of Session - 02/23/19 1443    Visit Number  3    Number of Visits  10    Date for PT Re-Evaluation  04/20/19    PT Start Time  1100    PT Stop Time  1200    PT Time Calculation (min)  60 min    Activity Tolerance  Patient tolerated treatment well;No increased pain    Behavior During Therapy  WFL for tasks assessed/performed       Past Medical History:  Diagnosis Date  . Asthma   . CHF (congestive heart failure) ([place])   . Cirrhosis, non-alcoholic ([place])   . COPD (chronic obstructive pulmonary disease) ([place])   . Deaf   . Depression   . Diabetes mellitus without complication ([place])   . GERD (gastroesophageal reflux disease)   . Heart murmur   . Hepatitis   . Hypertension   . Lymph node disorder    arm  . Neuropathy   . On home oxygen therapy    hs  . Orthopnea   . RLS (restless legs syndrome)   . Seizures ([place])   . Shortness of breath dyspnea   . Sleep apnea   . Stroke [facility])    tia    Past Surgical History:  Procedure Laterality Date  . CATARACT EXTRACTION W/PHACO Right 11/23/2014   Procedure: CATARACT EXTRACTION PHACO AND INTRAOCULAR LENS PLACEMENT (IOC);  Surgeon: Lyla Glassing, MD;  Location: [facility] ORS;  Service: Ophthalmology;  Laterality: Right;  . CATARACT EXTRACTION W/PHACO Left 12/14/2014   Procedure: CATARACT EXTRACTION PHACO AND INTRAOCULAR LENS PLACEMENT (IOC);  Surgeon: Lyla Glassing, MD;  Location: [facility] ORS;  Service: Ophthalmology;  Laterality: Left;  US:01:16.6 AP:15.8 CDE:12.14  . CESAREAN SECTION    . CHOLECYSTECTOMY    . KNEE ARTHROPLASTY    . THUMB ARTHROSCOPY    . TONSILLECTOMY    . TYMPANOPLASTY     muliple     Vitals:   02/23/19 1155  Pulse: 64  SpO2: 93%    Subjective Assessment - 02/23/19 1106    Subjective  Pt reported feeling good after last session for 5 days . On Sunday, her legs started hurting down the sides and front of thighs and behind knees. This pain felt different than the original pain. It felt more like soreness.  Shooting down pain her legs with walking is 6/10. When pt gets up from sitting, she feels sharp pain down back of the leg and it feels like the legs are swollen.  Pt does not feel like her legs are swollen at the end of the day when she has been standing on her feet for a long time.    Patient is accompained by:  Interpreter   hearing impaired   Pertinent History  Anxiety, Depression, heart Disease, Diabetes, HBP, Mild Stroke, RA, seizures, HA, Bronchitiits, Kidney Diseases.  Has COPD but only uses her O2 machine sometimes.         [facility] PT Assessment - 02/23/19 1153      Observation/Other Assessments   Observations  SOB with standing, sidelying clam  exercises       PROM   Overall PROM Comments  pre Tx:  hip ext ~ - 5 deg B, Post Tx: ~20 deg B       Strength   Overall Strength Comments  hip abd 3+/5 B       Palpation   SI assessment   B PSIS levelled     Palpation comment  increased tightness at L hamstrings ,  R glut med ,       Bed Mobility   Bed Mobility  --   no cues for log rolling     Sit to stand with breathholding, poor alignment              [facility] Adult PT Treatment/Exercise - 02/23/19 1242      Therapeutic Activites    Therapeutic Activities  Other Therapeutic Activities    Other Therapeutic Activities  reinforced the imoprtance of using her O2 machine for better circulation to legs and for heart/lung health       Neuro Re-ed    Neuro Re-ed Details   cued for wider BOS in hip ext exercise,       Manual Therapy   Manual therapy comments  long axis distraction B, STM/ MWM to increase hip ext, L hamstring/ IT band,                     PT Long Term Goals - 02/23/19 1109      PT LONG TERM GOAL #1   Title  Pt will have decreased frequent trips to the bathroom from 2 x within 2 hours to 1 x within 2 hours in order to eat in the morning and not delay taking her medications to go to the bathroom    Time  6    Period  Weeks    Status  Achieved      PT LONG TERM GOAL #2   Title  Pt will demo decreased LBP pain from 10/10 to < 5/10 with sit-stand and walking in order to perform ADLs    Time  8    Period  Weeks    Status  Achieved      PT LONG TERM GOAL #3   Title  Pt will demo increased mobility at T/L and L/S junction to optimize digestion/ pelvic floor function and postural stability    Time  4    Period  Weeks    Status  Achieved      PT LONG TERM GOAL #4   Title  Pt will demo proper deep core coordination and strengthening Level 1-2 exercises in order to promote motility for less diarrhea    Time  10    Period  Weeks    Status  On-going      PT LONG TERM GOAL #5   Title  Pt will increase her hip/ LE strength B from hip/ knee flex B 3/5, knee ext 4-/5 B  to overall 4+/5 to promote stability for sit to stand, walking and minimize radiating LBP    Baseline  hip/ knee flex B 3/5, knee ext 4-/5 B    Time  10    Period  Weeks    Status  On-going            Plan - 02/23/19 1249    Clinical Impression Statement  Pt's BLE  radiating pain was centralized with manual Tx to increase hip extension. Pt also was educated on co-activation of deep core activation and proper alignment with sit to stand to minimize complaints of weakness in BLE.  Pt reports her legs feel swollen with sit to stand but not at the end of the day. Reinforced importance of being compliantt with using her oxygen machine regularly and was explained the impact of better circulation for heart and lungs and legs and to minimize worsening of her COPD and her SOB with walking short distances and against gravity transitions. Pt's  Sat O2 remained 92 % with  activities today.  Pt demo'd increased hip extension and proper sit to stand transfers post training. Pt continues to benefit from skilled PT.     Personal Factors and Comorbidities  Age;Comorbidity 3+    Comorbidities  Anxiety, Depression, heart Disease, Diabetes, HBP, Mild Stroke, RA, seizures, HA, Bronchitiits, Kidney Diseases.  Has COPD but only uses her O2 machine sometimes.    Examination-Activity Limitations  Lift;Transfers;Stand;Locomotion Level    Stability/Clinical Decision Making  Evolving/Moderate complexity    Rehab Potential  Good    PT Frequency  1x / week    PT Duration  Other (comment)   10   PT Treatment/Interventions  Moist Heat;Traction;Gait training;Neuromuscular re-education;Patient/family education;Therapeutic activities;Therapeutic exercise;Manual lymph drainage;Manual techniques;Scar mobilization;Taping;Splinting;Balance training    Consulted and Agree with Plan of Care  Patient       Patient will benefit from skilled therapeutic intervention in order to improve the following deficits and impairments:  Abnormal gait, Decreased mobility, Decreased endurance, Difficulty walking, Decreased range of motion, Decreased coordination, Increased muscle spasms, Hypomobility, Improper body mechanics, Pain, Postural dysfunction, Decreased activity tolerance  Visit Diagnosis: 1. Other abnormalities of gait and mobility   2. Sacrococcygeal disorders, not elsewhere classified   3. Muscle weakness (generalized)        Problem List Patient Active Problem List   Diagnosis Date Noted  . PNA (pneumonia) 03/01/2018  . Bronchitis 09/23/2017  . Chronic diastolic heart failure ([place]) 08/26/2017  . Tobacco use 08/26/2017  . Seizures ([place]) 06/15/2017  . Goals of care, counseling/discussion 05/06/2017  . Status epilepticus ([place])   . CAP (community acquired pneumonia)   . Hypoxia   . Hypokalemia 02/12/2017  . GERD (gastroesophageal reflux disease)  03/23/2016  . Depression 03/23/2016  . DM (diabetes mellitus), type 2 ([place]) 03/22/2016  . Essential hypertension 03/22/2016  . Neuropathy 03/22/2016  . Convulsions ([place]) 03/22/2016  . COPD exacerbation ([place]) 03/31/2015    Jerl Mina ,PT, DPT, E-RYT  02/23/2019, 12:55 PM  [place] MAIN [facility] SERVICES [address] [place], [place], [address] Phone: [phone]   Fax:  [phone]  Name: UNNAMED HINO MRN: [id number] Date of Birth: [birth year]

## 2019-02-23 NOTE — Patient Instructions (Addendum)
   Standing:  10 reps on both sides x 3 x day     3 point tap   Feet are hip width Tap forward, center under hip not feet next to each other  Tap middle\, center  Tap back   __   Proper body mechanics with getting out of a chair to decrease strain  on back &pelvic floor   Avoid holding your breath when Getting out of the chair:  Scoot to front part of chair chair Heels behind feet, feet are hip width apart, nose over toes  Inhale like you are smelling roses Exhale to stand   5 x after each meal,    Next week 10 reps    15 reps per day, next week 30 reps   ____  Use your oxygen machine at home and when coming to the clinic

## 2019-03-04 ENCOUNTER — Encounter: Payer: Medicare HMO | Admitting: Physical Therapy

## 2019-03-09 ENCOUNTER — Encounter: Payer: Medicare HMO | Admitting: Physical Therapy

## 2019-03-16 ENCOUNTER — Encounter: Payer: Medicare HMO | Admitting: Physical Therapy

## 2019-03-23 ENCOUNTER — Encounter: Payer: Medicare HMO | Admitting: Physical Therapy

## 2019-03-24 ENCOUNTER — Encounter: Payer: Self-pay | Admitting: Emergency Medicine

## 2019-03-24 ENCOUNTER — Inpatient Hospital Stay
Admission: EM | Admit: 2019-03-24 | Discharge: 2019-03-26 | DRG: 641 | Disposition: A | Discharge status: Home / Self Care | Payer: Medicare HMO | Attending: Internal Medicine | Admitting: Internal Medicine

## 2019-03-24 ENCOUNTER — Other Ambulatory Visit: Payer: Self-pay

## 2019-03-24 ENCOUNTER — Emergency Department: Payer: Medicare HMO

## 2019-03-24 DIAGNOSIS — I110 Hypertensive heart disease with heart failure: Secondary | ICD-10-CM | POA: Diagnosis present

## 2019-03-24 DIAGNOSIS — F329 Major depressive disorder, single episode, unspecified: Secondary | ICD-10-CM | POA: Diagnosis present

## 2019-03-24 DIAGNOSIS — Z886 Allergy status to analgesic agent status: Secondary | ICD-10-CM

## 2019-03-24 DIAGNOSIS — G2581 Restless legs syndrome: Secondary | ICD-10-CM | POA: Diagnosis present

## 2019-03-24 DIAGNOSIS — Z9981 Dependence on supplemental oxygen: Secondary | ICD-10-CM | POA: Diagnosis not present

## 2019-03-24 DIAGNOSIS — Z9841 Cataract extraction status, right eye: Secondary | ICD-10-CM | POA: Diagnosis not present

## 2019-03-24 DIAGNOSIS — Z79899 Other long term (current) drug therapy: Secondary | ICD-10-CM

## 2019-03-24 DIAGNOSIS — K7460 Unspecified cirrhosis of liver: Secondary | ICD-10-CM | POA: Diagnosis present

## 2019-03-24 DIAGNOSIS — G4730 Sleep apnea, unspecified: Secondary | ICD-10-CM | POA: Diagnosis present

## 2019-03-24 DIAGNOSIS — E785 Hyperlipidemia, unspecified: Secondary | ICD-10-CM | POA: Diagnosis present

## 2019-03-24 DIAGNOSIS — R002 Palpitations: Secondary | ICD-10-CM | POA: Diagnosis not present

## 2019-03-24 DIAGNOSIS — J449 Chronic obstructive pulmonary disease, unspecified: Secondary | ICD-10-CM | POA: Diagnosis present

## 2019-03-24 DIAGNOSIS — Z66 Do not resuscitate: Secondary | ICD-10-CM | POA: Diagnosis present

## 2019-03-24 DIAGNOSIS — F17210 Nicotine dependence, cigarettes, uncomplicated: Secondary | ICD-10-CM | POA: Diagnosis present

## 2019-03-24 DIAGNOSIS — Z880 Allergy status to penicillin: Secondary | ICD-10-CM

## 2019-03-24 DIAGNOSIS — Z885 Allergy status to narcotic agent status: Secondary | ICD-10-CM

## 2019-03-24 DIAGNOSIS — G40909 Epilepsy, unspecified, not intractable, without status epilepticus: Secondary | ICD-10-CM | POA: Diagnosis present

## 2019-03-24 DIAGNOSIS — E1140 Type 2 diabetes mellitus with diabetic neuropathy, unspecified: Secondary | ICD-10-CM | POA: Diagnosis present

## 2019-03-24 DIAGNOSIS — E876 Hypokalemia: Secondary | ICD-10-CM | POA: Diagnosis present

## 2019-03-24 DIAGNOSIS — Z20828 Contact with and (suspected) exposure to other viral communicable diseases: Secondary | ICD-10-CM | POA: Diagnosis present

## 2019-03-24 DIAGNOSIS — K219 Gastro-esophageal reflux disease without esophagitis: Secondary | ICD-10-CM | POA: Diagnosis present

## 2019-03-24 DIAGNOSIS — R42 Dizziness and giddiness: Secondary | ICD-10-CM | POA: Diagnosis present

## 2019-03-24 DIAGNOSIS — Z961 Presence of intraocular lens: Secondary | ICD-10-CM | POA: Diagnosis present

## 2019-03-24 DIAGNOSIS — Z23 Encounter for immunization: Secondary | ICD-10-CM

## 2019-03-24 DIAGNOSIS — H9190 Unspecified hearing loss, unspecified ear: Secondary | ICD-10-CM | POA: Diagnosis present

## 2019-03-24 DIAGNOSIS — Z8249 Family history of ischemic heart disease and other diseases of the circulatory system: Secondary | ICD-10-CM

## 2019-03-24 DIAGNOSIS — Z7902 Long term (current) use of antithrombotics/antiplatelets: Secondary | ICD-10-CM

## 2019-03-24 DIAGNOSIS — I5032 Chronic diastolic (congestive) heart failure: Secondary | ICD-10-CM | POA: Diagnosis present

## 2019-03-24 DIAGNOSIS — Z9842 Cataract extraction status, left eye: Secondary | ICD-10-CM | POA: Diagnosis not present

## 2019-03-24 DIAGNOSIS — Z8673 Personal history of transient ischemic attack (TIA), and cerebral infarction without residual deficits: Secondary | ICD-10-CM | POA: Diagnosis not present

## 2019-03-24 DIAGNOSIS — Z882 Allergy status to sulfonamides status: Secondary | ICD-10-CM

## 2019-03-24 DIAGNOSIS — Z888 Allergy status to other drugs, medicaments and biological substances status: Secondary | ICD-10-CM

## 2019-03-24 DIAGNOSIS — Z7984 Long term (current) use of oral hypoglycemic drugs: Secondary | ICD-10-CM

## 2019-03-24 DIAGNOSIS — Z7951 Long term (current) use of inhaled steroids: Secondary | ICD-10-CM

## 2019-03-24 LAB — CBC
HCT: 47.1 % — ABNORMAL HIGH (ref 36.0–46.0)
Hemoglobin: 15.5 g/dL — ABNORMAL HIGH (ref 12.0–15.0)
MCH: 26.9 pg (ref 26.0–34.0)
MCHC: 32.9 g/dL (ref 30.0–36.0)
MCV: 81.6 fL (ref 80.0–100.0)
Platelets: 254 10*3/uL (ref 150–400)
RBC: 5.77 MIL/uL — ABNORMAL HIGH (ref 3.87–5.11)
RDW: 12.7 % (ref 11.5–15.5)
WBC: 6.3 10*3/uL (ref 4.0–10.5)
nRBC: 0 % (ref 0.0–0.2)

## 2019-03-24 LAB — BASIC METABOLIC PANEL
Anion gap: 14 (ref 5–15)
BUN: 12 mg/dL (ref 8–23)
CO2: 32 mmol/L (ref 22–32)
Calcium: 8.9 mg/dL (ref 8.9–10.3)
Chloride: 94 mmol/L — ABNORMAL LOW (ref 98–111)
Creatinine, Ser: 0.65 mg/dL (ref 0.44–1.00)
GFR calc Af Amer: >60 mL/min (ref >60)
GFR calc non Af Amer: >60 mL/min (ref >60)
Glucose, Bld: 294 mg/dL — ABNORMAL HIGH (ref 70–99)
Potassium: 2 mmol/L — CL (ref 3.5–5.1)
Sodium: 140 mmol/L (ref 135–145)

## 2019-03-24 LAB — GLUCOSE, CAPILLARY
Glucose-Capillary: 150 mg/dL — ABNORMAL HIGH (ref 70–99)
Glucose-Capillary: 171 mg/dL — ABNORMAL HIGH (ref 70–99)

## 2019-03-24 LAB — POTASSIUM: Potassium: 3.2 mmol/L — ABNORMAL LOW (ref 3.5–5.1)

## 2019-03-24 LAB — MAGNESIUM: Magnesium: 1.7 mg/dL (ref 1.7–2.4)

## 2019-03-24 MED ORDER — DILTIAZEM HCL ER COATED BEADS 180 MG PO CP24
180.0000 mg | ORAL_CAPSULE | Freq: Every day | ORAL | Status: DC
  Filled 2019-03-24: qty 1

## 2019-03-24 MED ORDER — MAGNESIUM SULFATE 2 GM/50ML IV SOLN
2.0000 g | Freq: Once | INTRAVENOUS | Status: AC
  Administered 2019-03-24: 13:00:00 2 g via INTRAVENOUS
  Filled 2019-03-24: qty 50

## 2019-03-24 MED ORDER — MOMETASONE FURO-FORMOTEROL FUM 200-5 MCG/ACT IN AERO
2.0000 | INHALATION_SPRAY | Freq: Two times a day (BID) | RESPIRATORY_TRACT | Status: DC
  Administered 2019-03-24: 22:00:00 2 via RESPIRATORY_TRACT
  Filled 2019-03-24: qty 8.8

## 2019-03-24 MED ORDER — LACTATED RINGERS IV SOLN
INTRAVENOUS | Status: DC
  Administered 2019-03-24: 13:00:00 via INTRAVENOUS

## 2019-03-24 MED ORDER — INSULIN ASPART 100 UNIT/ML ~~LOC~~ SOLN
0.0000 [IU] | Freq: Three times a day (TID) | SUBCUTANEOUS | Status: DC
  Administered 2019-03-24 – 2019-03-25 (×2): 1 [IU] via SUBCUTANEOUS
  Administered 2019-03-25: 09:00:00 2 [IU] via SUBCUTANEOUS
  Administered 2019-03-25: 3 [IU] via SUBCUTANEOUS
  Administered 2019-03-26 (×2): 1 [IU] via SUBCUTANEOUS
  Filled 2019-03-24 (×5): qty 1

## 2019-03-24 MED ORDER — CLOPIDOGREL BISULFATE 75 MG PO TABS
75.0000 mg | ORAL_TABLET | Freq: Every day | ORAL | Status: DC
  Administered 2019-03-25 – 2019-03-26 (×2): 75 mg via ORAL
  Filled 2019-03-24 (×2): qty 1

## 2019-03-24 MED ORDER — CITALOPRAM HYDROBROMIDE 20 MG PO TABS
10.0000 mg | ORAL_TABLET | Freq: Every day | ORAL | Status: DC
  Administered 2019-03-25 – 2019-03-26 (×2): 10 mg via ORAL
  Filled 2019-03-24 (×2): qty 1

## 2019-03-24 MED ORDER — INSULIN ASPART 100 UNIT/ML ~~LOC~~ SOLN
0.0000 [IU] | Freq: Every day | SUBCUTANEOUS | Status: DC
  Filled 2019-03-24: qty 1

## 2019-03-24 MED ORDER — POTASSIUM CHLORIDE 10 MEQ/100ML IV SOLN
10.0000 meq | INTRAVENOUS | Status: DC
  Administered 2019-03-24: 14:00:00 10 meq via INTRAVENOUS
  Filled 2019-03-24 (×5): qty 100

## 2019-03-24 MED ORDER — DOCUSATE SODIUM 100 MG PO CAPS: 100.0000 mg | ORAL_CAPSULE | Freq: Two times a day (BID) | ORAL | Status: DC | PRN

## 2019-03-24 MED ORDER — MOMETASONE FURO-FORMOTEROL FUM 200-5 MCG/ACT IN AERO: 2.0000 | INHALATION_SPRAY | Freq: Two times a day (BID) | RESPIRATORY_TRACT | Status: DC

## 2019-03-24 MED ORDER — POTASSIUM CHLORIDE 20 MEQ PO PACK
40.0000 meq | PACK | Freq: Two times a day (BID) | ORAL | Status: DC
  Administered 2019-03-24: 21:00:00 40 meq via ORAL
  Filled 2019-03-24: qty 2

## 2019-03-24 MED ORDER — ALBUTEROL SULFATE HFA 108 (90 BASE) MCG/ACT IN AERS: 2.0000 | INHALATION_SPRAY | RESPIRATORY_TRACT | Status: DC | PRN

## 2019-03-24 MED ORDER — ALBUTEROL SULFATE (2.5 MG/3ML) 0.083% IN NEBU: 2.5000 mg | INHALATION_SOLUTION | RESPIRATORY_TRACT | Status: DC | PRN

## 2019-03-24 MED ORDER — GLIPIZIDE ER 5 MG PO TB24
5.0000 mg | ORAL_TABLET | Freq: Every day | ORAL | Status: DC
  Filled 2019-03-24 (×2): qty 1

## 2019-03-24 MED ORDER — POTASSIUM CHLORIDE CRYS ER 20 MEQ PO TBCR
40.0000 meq | EXTENDED_RELEASE_TABLET | Freq: Once | ORAL | Status: DC
  Filled 2019-03-24: qty 2

## 2019-03-24 MED ORDER — ROPINIROLE HCL 0.25 MG PO TABS
0.2500 mg | ORAL_TABLET | Freq: Three times a day (TID) | ORAL | Status: DC
  Administered 2019-03-24: 0.25 mg via ORAL
  Filled 2019-03-24 (×3): qty 1

## 2019-03-24 MED ORDER — ROSUVASTATIN CALCIUM 10 MG PO TABS
10.0000 mg | ORAL_TABLET | Freq: Every day | ORAL | Status: DC
  Administered 2019-03-24 – 2019-03-26 (×3): 10 mg via ORAL
  Filled 2019-03-24 (×3): qty 1

## 2019-03-24 MED ORDER — OXCARBAZEPINE 150 MG PO TABS
150.0000 mg | ORAL_TABLET | Freq: Two times a day (BID) | ORAL | Status: DC
  Administered 2019-03-24 – 2019-03-26 (×4): 150 mg via ORAL
  Filled 2019-03-24 (×5): qty 1

## 2019-03-24 MED ORDER — RISAQUAD PO CAPS
1.0000 | ORAL_CAPSULE | Freq: Every day | ORAL | Status: DC
  Administered 2019-03-25 – 2019-03-26 (×2): 1 via ORAL
  Filled 2019-03-24 (×2): qty 1

## 2019-03-24 MED ORDER — INFLUENZA VAC A&B SA ADJ QUAD 0.5 ML IM PRSY
0.5000 mL | PREFILLED_SYRINGE | INTRAMUSCULAR | Status: AC
  Administered 2019-03-26: 14:00:00 0.5 mL via INTRAMUSCULAR
  Filled 2019-03-24 (×2): qty 0.5

## 2019-03-24 MED ORDER — PANTOPRAZOLE SODIUM 40 MG PO TBEC
40.0000 mg | DELAYED_RELEASE_TABLET | Freq: Every day | ORAL | Status: DC
  Administered 2019-03-25 – 2019-03-26 (×2): 40 mg via ORAL
  Filled 2019-03-24 (×2): qty 1

## 2019-03-24 MED ORDER — POTASSIUM CHLORIDE 10 MEQ/100ML IV SOLN
10.0000 meq | INTRAVENOUS | Status: AC
  Administered 2019-03-24 (×3): 10 meq via INTRAVENOUS
  Filled 2019-03-24: qty 100

## 2019-03-24 MED ORDER — MONTELUKAST SODIUM 10 MG PO TABS
10.0000 mg | ORAL_TABLET | Freq: Every day | ORAL | Status: DC
  Administered 2019-03-24 – 2019-03-25 (×2): 10 mg via ORAL
  Filled 2019-03-24 (×2): qty 1

## 2019-03-24 MED ORDER — LOSARTAN POTASSIUM 25 MG PO TABS
25.0000 mg | ORAL_TABLET | Freq: Every day | ORAL | Status: DC
  Administered 2019-03-25 – 2019-03-26 (×2): 25 mg via ORAL
  Filled 2019-03-24 (×2): qty 1

## 2019-03-24 MED ORDER — LACOSAMIDE 50 MG PO TABS
200.0000 mg | ORAL_TABLET | Freq: Two times a day (BID) | ORAL | Status: DC
  Administered 2019-03-24: 200 mg via ORAL
  Filled 2019-03-24 (×2): qty 4

## 2019-03-24 MED ORDER — SODIUM CHLORIDE 0.9% FLUSH: 3.0000 mL | Freq: Once | INTRAVENOUS | Status: DC

## 2019-03-24 MED ORDER — POTASSIUM CHLORIDE CRYS ER 20 MEQ PO TBCR: 40.0000 meq | EXTENDED_RELEASE_TABLET | Freq: Two times a day (BID) | ORAL | Status: DC

## 2019-03-24 MED ORDER — HEPARIN SODIUM (PORCINE) 5000 UNIT/ML IJ SOLN
5000.0000 [IU] | Freq: Three times a day (TID) | INTRAMUSCULAR | Status: DC
  Administered 2019-03-24 – 2019-03-26 (×5): 5000 [IU] via SUBCUTANEOUS
  Filled 2019-03-24 (×5): qty 1

## 2019-03-24 NOTE — ED Notes (Signed)
Triage with asl interpretter on a stick.

## 2019-03-24 NOTE — Progress Notes (Signed)
Pt changed her code status, explained by nurse with help of translator, she want to be full code.

## 2019-03-24 NOTE — H&P (Signed)
[place] [facility] [place] NAME: [name]    MR#:  [id number]  DATE OF BIRTH:  26-Feb-1951  DATE OF ADMISSION:  03/24/2019  PRIMARY CARE PHYSICIAN: [facility] [place]   REQUESTING/REFERRING PHYSICIAN: [place]  CHIEF COMPLAINT:   Chief Complaint  Patient presents with  . Abnormal Lab    HISTORY OF PRESENT ILLNESS: [name]  is a 68 y.o. female with a known history of asthma, CHF, nonalcoholic liver cirrhosis, COPD, depression, diabetes, hypertension, neuropathy, seizures, sleep apnea, stroke-was prescribed Lasix and potassium tablets at home.  She feels that she is having a lot of urination due to potassium tablets and she stopped taking it 2 months ago by herself. She went to her primary care doctor for routine checkup.  She does not have any major complaints except for some weakness in her legs and headache. Her primary care doctor did the blood work and called her today to go to emergency room for low potassium. She was noted to have a potassium of 2 today in emergency room. Denies any other complaints.  She is deaf and her interview was done by me with help of sign language translator on iPad.  PAST MEDICAL HISTORY:   Past Medical History:  Diagnosis Date  . Asthma   . CHF (congestive heart failure) ([place])   . Cirrhosis, non-alcoholic ([place])   . COPD (chronic obstructive pulmonary disease) ([place])   . Deaf   . Depression   . Diabetes mellitus without complication ([place])   . GERD (gastroesophageal reflux disease)   . Heart murmur   . Hepatitis   . Hypertension   . Lymph node disorder    arm  . Neuropathy   . On home oxygen therapy    hs  . Orthopnea   . RLS (restless legs syndrome)   . Seizures ([place])   . Shortness of breath dyspnea   . Sleep apnea   . Stroke [facility])    tia    PAST SURGICAL HISTORY:  Past Surgical History:  Procedure Laterality Date  . CATARACT EXTRACTION W/PHACO Right 11/23/2014    Procedure: CATARACT EXTRACTION PHACO AND INTRAOCULAR LENS PLACEMENT (IOC);  Surgeon: Lyla Glassing, MD;  Location: [facility] ORS;  Service: Ophthalmology;  Laterality: Right;  . CATARACT EXTRACTION W/PHACO Left 12/14/2014   Procedure: CATARACT EXTRACTION PHACO AND INTRAOCULAR LENS PLACEMENT (IOC);  Surgeon: Lyla Glassing, MD;  Location: [facility] ORS;  Service: Ophthalmology;  Laterality: Left;  US:01:16.6 AP:15.8 CDE:12.14  . CESAREAN SECTION    . CHOLECYSTECTOMY    . KNEE ARTHROPLASTY    . THUMB ARTHROSCOPY    . TONSILLECTOMY    . TYMPANOPLASTY     muliple    SOCIAL HISTORY:  Social History   Tobacco Use  . Smoking status: Current Every Day Smoker    Packs/day: 0.50    Years: 50.00    Pack years: 25.00    Types: Cigarettes    Start date: 03/01/1968  . Smokeless tobacco: Never Used  Substance Use Topics  . Alcohol use: No    FAMILY HISTORY:  Family History  Problem Relation Age of Onset  . Lung cancer Mother   . CAD Father     DRUG ALLERGIES:  Allergies  Allergen Reactions  . Aspirin Itching  . Celebrex [Celecoxib] Itching    itching  . Ciprofloxacin Itching  . Codeine Itching  . Fosphenytoin Itching  . Levaquin [Levofloxacin In D5w] Itching  . Levofloxacin Itching  . Lovastatin  Itching  . Penicillins     Documentation indicates severe reaction  Pt tolerated cephalosporin without adverse reaction 09/18   . Pravastatin Itching  . Sulfa Antibiotics Itching    REVIEW OF SYSTEMS:   CONSTITUTIONAL: No fever, have some fatigue or weakness.  EYES: No blurred or double vision.  EARS, NOSE, AND THROAT: No tinnitus or ear pain.  RESPIRATORY: No cough, shortness of breath, wheezing or hemoptysis.  CARDIOVASCULAR: No chest pain, orthopnea, edema.  GASTROINTESTINAL: No nausea, vomiting, diarrhea or abdominal pain.  GENITOURINARY: No dysuria, hematuria.  ENDOCRINE: No polyuria, nocturia,  HEMATOLOGY: No anemia, easy bruising or bleeding SKIN: No rash or  lesion. MUSCULOSKELETAL: No joint pain or arthritis.   NEUROLOGIC: No tingling, numbness, weakness.  PSYCHIATRY: No anxiety or depression.   MEDICATIONS AT HOME:  Prior to Admission medications   Medication Sig Start Date End Date Taking? Authorizing Provider  acidophilus (RISAQUAD) CAPS capsule Take 1 capsule by mouth daily. 06/27/17  Yes Gladstone Lighter, MD  amLODipine (NORVASC) 10 MG tablet Take 1 tablet (10 mg total) by mouth daily. 08/18/17  Yes Salary, Avel Peace, MD  citalopram (CELEXA) 10 MG tablet Take 10 mg by mouth daily. 03/02/19  Yes [provider]  clopidogrel (PLAVIX) 75 MG tablet Take 1 tablet (75 mg total) by mouth daily. 07/17/16  Yes Vaughan Basta, MD  diltiazem (DILACOR XR) 180 MG 24 hr capsule Take 180 mg by mouth daily.   Yes [provider]  furosemide (LASIX) 40 MG tablet Take 40 mg by mouth daily. 07/22/18  Yes [provider]  losartan (COZAAR) 25 MG tablet Take 25 mg by mouth daily. 03/02/19  Yes [provider]  montelukast (SINGULAIR) 10 MG tablet Take 1 tablet by mouth at bedtime. 03/18/16  Yes [provider]  omeprazole (PRILOSEC) 20 MG capsule Take 20 mg by mouth daily.   Yes [provider]  Oxcarbazepine (TRILEPTAL) 300 MG tablet Take 150 mg by mouth 2 (two) times daily. 02/22/19  Yes [provider]  potassium chloride (K-DUR) 10 MEQ tablet Take 2 tablets (20 mEq total) by mouth 2 (two) times daily. 08/26/17 03/24/19 Yes Hackney, Otila Kluver A, FNP  rosuvastatin (CRESTOR) 10 MG tablet Take 10 mg by mouth daily. 04/21/17  Yes [provider]  SYMBICORT 160-4.5 MCG/ACT inhaler Inhale 2 puffs into the lungs 2 (two) times daily. 02/28/19  Yes [provider]  albuterol (PROVENTIL HFA;VENTOLIN HFA) 108 (90 BASE) MCG/ACT inhaler Inhale 2 puffs into the lungs every 4 (four) hours as needed for wheezing or shortness of breath.    [provider]  albuterol (PROVENTIL) (2.5 MG/3ML)  0.083% nebulizer solution Take 3 mLs (2.5 mg total) by nebulization every 4 (four) hours as needed for wheezing or shortness of breath. 09/23/17   Darylene Price A, FNP  GLIPIZIDE XL 5 MG 24 hr tablet Take 1 tablet (5 mg total) by mouth daily. Patient not taking: Reported on 03/24/2019 05/07/17   Loletha Grayer, MD  lacosamide (VIMPAT) 200 MG TABS tablet Take 1 tablet (200 mg total) by mouth 2 (two) times daily. Patient not taking: Reported on 03/24/2019 05/07/17   Loletha Grayer, MD  mometasone-formoterol [facility]) 200-5 MCG/ACT AERO Inhale 2 puffs into the lungs 2 (two) times daily. Patient not taking: Reported on 03/24/2019 07/17/16   Vaughan Basta, MD  rOPINIRole (REQUIP) 0.25 MG tablet Take 1 tablet (0.25 mg total) by mouth 3 (three) times daily. Patient not taking: Reported on 03/24/2019 03/24/16   Oswald Hillock, MD  PHYSICAL EXAMINATION:   VITAL SIGNS: Blood pressure (!) 148/91, pulse (!) 57, temperature 97.9 F (36.6 C), temperature source Oral, resp. rate (!) 24, SpO2 94 %.  GENERAL:  68 y.o.-year-old patient lying in the bed with no acute distress.  EYES: Pupils equal, round, reactive to light and accommodation. No scleral icterus. Extraocular muscles intact.  HEENT: Head atraumatic, normocephalic. Oropharynx and nasopharynx clear.  NECK:  Supple, no jugular venous distention. No thyroid enlargement, no tenderness.  LUNGS: Normal breath sounds bilaterally, no wheezing, rales,rhonchi or crepitation. No use of accessory muscles of respiration.  CARDIOVASCULAR: S1, S2 normal. No murmurs, rubs, or gallops.  ABDOMEN: Soft, nontender, nondistended. Bowel sounds present. No organomegaly or mass.  EXTREMITIES: No pedal edema, cyanosis, or clubbing.  NEUROLOGIC: Cranial nerves II through XII are intact. Muscle strength 5/5 in all extremities. Sensation intact. Gait not checked.  PSYCHIATRIC: The patient is alert and oriented x 3.  SKIN: No obvious rash, lesion, or ulcer.    LABORATORY PANEL:   CBC Recent Labs  Lab 03/24/19 1127  WBC 6.3  HGB 15.5*  HCT 47.1*  PLT 254  MCV 81.6  MCH 26.9  MCHC 32.9  RDW 12.7   ------------------------------------------------------------------------------------------------------------------  Chemistries  Recent Labs  Lab 03/24/19 1127  NA 140  K 2.0*  CL 94*  CO2 32  GLUCOSE 294*  BUN 12  CREATININE 0.65  CALCIUM 8.9  MG 1.7   ------------------------------------------------------------------------------------------------------------------ CrCl cannot be calculated (Unknown ideal weight.). ------------------------------------------------------------------------------------------------------------------ No results for input(s): TSH, T4TOTAL, T3FREE, THYROIDAB in the last 72 hours.  Invalid input(s): FREET3   Coagulation profile No results for input(s): INR, PROTIME in the last 168 hours. ------------------------------------------------------------------------------------------------------------------- No results for input(s): DDIMER in the last 72 hours. -------------------------------------------------------------------------------------------------------------------  Cardiac Enzymes No results for input(s): CKMB, TROPONINI, MYOGLOBIN in the last 168 hours.  Invalid input(s): CK ------------------------------------------------------------------------------------------------------------------ Invalid input(s): POCBNP  ---------------------------------------------------------------------------------------------------------------  Urinalysis    Component Value Date/Time   COLORURINE YELLOW (A) 08/14/2017 1622   APPEARANCEUR HAZY (A) 08/14/2017 1622   APPEARANCEUR Clear 11/14/2011 1607   LABSPEC >1.046 (H) 08/14/2017 1622   LABSPEC 1.006 11/14/2011 1607   PHURINE 6.0 08/14/2017 1622   GLUCOSEU NEGATIVE 08/14/2017 1622   GLUCOSEU Negative 11/14/2011 1607   HGBUR NEGATIVE 08/14/2017 1622    BILIRUBINUR NEGATIVE 08/14/2017 1622   BILIRUBINUR Negative 11/14/2011 1607   KETONESUR 5 (A) 08/14/2017 1622   PROTEINUR NEGATIVE 08/14/2017 1622   NITRITE NEGATIVE 08/14/2017 1622   LEUKOCYTESUR SMALL (A) 08/14/2017 1622   LEUKOCYTESUR Negative 11/14/2011 1607     RADIOLOGY: Dg Chest Portable 1 View  Result Date: 03/24/2019 CLINICAL DATA:  PER [facility] SHE WAS SENT HERE FOR POTASSIUM 2.4 (DRAWN YESTERDAY) hx of stroke, hypertension, diabetes, COPD, CHF, asthma. Current every day smoker EXAM: PORTABLE CHEST 1 VIEW COMPARISON:  03/01/2018 FINDINGS: Cardiac silhouette is normal in size. No mediastinal or hilar masses. No evidence of adenopathy. Lungs are clear.  No pleural effusion or pneumothorax. Skeletal structures are grossly intact. IMPRESSION: No active disease. Electronically Signed   By: Lajean Manes M.D.   On: 03/24/2019 13:56    EKG: Orders placed or performed during the hospital encounter of 03/24/19  . ED EKG  . ED EKG  . EKG 12-Lead  . EKG 12-Lead  . ED EKG  . ED EKG    IMPRESSION AND PLAN:  *Hypokalemia Replace IV and oral. Magnesium is 1.7 in ER has ordered 1 dose.  *Chronic diastolic heart failure On Lasix at home, currently will  hold because of hypokalemia. No exacerbation symptoms.  *Diabetes Continue glipizide and keep on sliding scale coverage.  *Hyperlipidemia Continue rosuvastatin.  *Hypertension Continue home medications.  *COPD/asthma Currently no exacerbation symptoms continue home inhalers.  *Nonalcoholic liver cirrhosis Continue to monitor currently stable.  *Seizure disorder Continue Trileptal.  Continue Vimpat.   All the records are reviewed and case discussed with ED provider. Management plans discussed with the patient, family and they are in agreement.  CODE STATUS: DNR. Code Status History    Date Active Date Inactive Code Status Order ID Comments User Context   03/01/2018 1402 03/03/2018 1716 Full Code [id number]   Dustin Flock, MD ED   08/15/2017 0325 08/17/2017 1710 Full Code [id number]  Lance Coon, MD Inpatient   06/26/2017 0034 06/27/2017 1432 Full Code [id number]  Gorden Harms, MD Inpatient   06/15/2017 2015 06/17/2017 1627 Full Code [id number]  Fritzi Mandes, MD Inpatient   06/15/2017 1847 06/15/2017 2015 DNR [id number]  Fritzi Mandes, MD ED   05/04/2017 0835 05/07/2017 1832 DNR [id number]  Max Sane, MD Inpatient   04/29/2017 1915 05/04/2017 0835 Full Code [id number]  Angela Adam, RN Inpatient   03/25/2017 0235 03/30/2017 1907 Full Code [id number]  [place]el[place], [place], DO Inpatient   02/11/2017 2131 02/12/2017 1636 Full Code [id number]  Demetrios Loll, MD Inpatient   07/11/2016 1131 07/17/2016 1508 Partial Code [id number]  Marijo Conception, RN Inpatient   07/11/2016 0237 07/11/2016 1131 Full Code [id number]  Harvie Bridge, DO Inpatient   03/22/2016 2244 03/24/2016 1912 Partial Code [id number]  Toy Baker, MD Inpatient   03/29/2015 1130 03/31/2015 1421 Partial Code [id number]  Loletha Grayer, MD ED   Advance Care Planning Activity       TOTAL TIME TAKING CARE OF THIS PATIENT: *50 minutes.    Vaughan Basta M.D on 03/24/2019   Between 7am to 6pm - Pager - [phone]  After 6pm go to [email or web address] - password [email or web address]  Office  [phone]  CC: Primary care physician; [facility]   Note: This dictation was prepared with Dragon dictation along with smaller phrase technology. Any transcriptional errors that result from this process are unintentional.

## 2019-03-24 NOTE — ED Triage Notes (Signed)
Abnormal labwork.. does not know what the lab was.  Starts with a p---?potassium

## 2019-03-24 NOTE — ED Provider Notes (Signed)
[facility] Emergency Department Provider Note  ____________________________________________   First MD Initiated Contact with Patient 03/24/19 1220     (approximate)  I have reviewed the triage vital signs and the nursing notes.   HISTORY  Chief Complaint Abnormal Lab    HPI [name] is a 68 y.o. female  Withh/o CHF, COPD, deafness, seizure d/o, here with weakness. Pt states that she initially presented to her cardiologist this week 2/2 intermittent CP, palpitaitons, and general weakness. She's felt generally tired, weak, with poor appetite. She reports a 1 year h/o loose BM and she also recently stopped taking her potassium supplements 2/2 Gi upset. She had lab work drawn at her Cardiologist which shows K 2.4 so was told to come here. She has otherwise continued her lasix. No vomiting. No current CP. No other complaints.         Past Medical History:  Diagnosis Date  . Asthma   . CHF (congestive heart failure) ([place])   . Cirrhosis, non-alcoholic ([place])   . COPD (chronic obstructive pulmonary disease) ([place])   . Deaf   . Depression   . Diabetes mellitus without complication ([place])   . GERD (gastroesophageal reflux disease)   . Heart murmur   . Hepatitis   . Hypertension   . Lymph node disorder    arm  . Neuropathy   . On home oxygen therapy    hs  . Orthopnea   . RLS (restless legs syndrome)   . Seizures ([place])   . Shortness of breath dyspnea   . Sleep apnea   . Stroke [facility])    tia    Patient Active Problem List   Diagnosis Date Noted  . PNA (pneumonia) 03/01/2018  . Bronchitis 09/23/2017  . Chronic diastolic heart failure ([place]) 08/26/2017  . Tobacco use 08/26/2017  . Seizures ([place]) 06/15/2017  . Goals of care, counseling/discussion 05/06/2017  . Status epilepticus ([place])   . CAP (community acquired pneumonia)   . Hypoxia   . Hypokalemia 02/12/2017  . GERD (gastroesophageal reflux disease) 03/23/2016  . Depression 03/23/2016   . DM (diabetes mellitus), type 2 ([place]) 03/22/2016  . Essential hypertension 03/22/2016  . Neuropathy 03/22/2016  . Convulsions ([place]) 03/22/2016  . COPD exacerbation ([place]) 03/31/2015    Past Surgical History:  Procedure Laterality Date  . CATARACT EXTRACTION W/PHACO Right 11/23/2014   Procedure: CATARACT EXTRACTION PHACO AND INTRAOCULAR LENS PLACEMENT (IOC);  Surgeon: Lyla Glassing, MD;  Location: [facility] ORS;  Service: Ophthalmology;  Laterality: Right;  . CATARACT EXTRACTION W/PHACO Left 12/14/2014   Procedure: CATARACT EXTRACTION PHACO AND INTRAOCULAR LENS PLACEMENT (IOC);  Surgeon: Lyla Glassing, MD;  Location: [facility] ORS;  Service: Ophthalmology;  Laterality: Left;  US:01:16.6 AP:15.8 CDE:12.14  . CESAREAN SECTION    . CHOLECYSTECTOMY    . KNEE ARTHROPLASTY    . THUMB ARTHROSCOPY    . TONSILLECTOMY    . TYMPANOPLASTY     muliple    Prior to Admission medications   Medication Sig Start Date End Date Taking? Authorizing Provider  acidophilus (RISAQUAD) CAPS capsule Take 1 capsule by mouth daily. 06/27/17   Gladstone Lighter, MD  albuterol (PROVENTIL HFA;VENTOLIN HFA) 108 (90 BASE) MCG/ACT inhaler Inhale 2 puffs into the lungs every 4 (four) hours as needed for wheezing or shortness of breath.    [provider]  albuterol (PROVENTIL) (2.5 MG/3ML) 0.083% nebulizer solution Take 3 mLs (2.5 mg total) by nebulization every 4 (four) hours as needed for wheezing or  shortness of breath. 09/23/17   Darylene Price A, FNP  amLODipine (NORVASC) 10 MG tablet Take 1 tablet (10 mg total) by mouth daily. 08/18/17   Salary, Avel Peace, MD  citalopram (CELEXA) 20 MG tablet Take 1 tablet (20 mg total) by mouth daily. 07/17/16   Vaughan Basta, MD  clopidogrel (PLAVIX) 75 MG tablet Take 1 tablet (75 mg total) by mouth daily. 07/17/16   Vaughan Basta, MD  diltiazem (DILACOR XR) 180 MG 24 hr capsule Take 180 mg by mouth daily.    [provider]  GLIPIZIDE XL 5 MG 24 hr  tablet Take 1 tablet (5 mg total) by mouth daily. 05/07/17   Loletha Grayer, MD  lacosamide (VIMPAT) 200 MG TABS tablet Take 1 tablet (200 mg total) by mouth 2 (two) times daily. 05/07/17   Loletha Grayer, MD  losartan (COZAAR) 100 MG tablet Take 1 tablet (100 mg total) by mouth daily. Patient taking differently: Take 25 mg by mouth daily.  08/18/17   Salary, Avel Peace, MD  mometasone-formoterol (DULERA) 200-5 MCG/ACT AERO Inhale 2 puffs into the lungs 2 (two) times daily. 07/17/16   Vaughan Basta, MD  montelukast (SINGULAIR) 10 MG tablet Take 1 tablet by mouth at bedtime. 03/18/16   [provider]  naproxen (NAPROSYN) 500 MG tablet Take 250 mg by mouth 2 (two) times daily as needed for mild pain or moderate pain.    [provider]  omeprazole (PRILOSEC) 20 MG capsule Take 20 mg by mouth daily.    [provider]  OXcarbazepine (TRILEPTAL) 150 MG tablet Take 1 tablet (150 mg total) by mouth 2 (two) times daily. 06/17/17   Vaughan Basta, MD  potassium chloride (K-DUR) 10 MEQ tablet Take 2 tablets (20 mEq total) by mouth 2 (two) times daily. 08/26/17 03/01/18  Alisa Graff, FNP  predniSONE (STERAPRED UNI-PAK 21 TAB) 10 MG (21) TBPK tablet 6 tabs day 1 and taper 10 mg a day - 6 days 03/03/18   Hillary Bow, MD  rOPINIRole (REQUIP) 0.25 MG tablet Take 1 tablet (0.25 mg total) by mouth 3 (three) times daily. 03/24/16   Oswald Hillock, MD  rosuvastatin (CRESTOR) 10 MG tablet Take 10 mg by mouth daily. 04/21/17   [provider]    Allergies Aspirin, Celebrex [celecoxib], Ciprofloxacin, Codeine, Fosphenytoin, Levaquin [levofloxacin in d5w], Levofloxacin, Lovastatin, Penicillins, Pravastatin, and Sulfa antibiotics  Family History  Problem Relation Age of Onset  . Lung cancer Mother   . CAD Father     Social History Social History   Tobacco Use  . Smoking status: Current Every Day Smoker    Packs/day: 0.50    Years: 50.00    Pack years:  25.00    Types: Cigarettes    Start date: 03/01/1968  . Smokeless tobacco: Never Used  Substance Use Topics  . Alcohol use: No  . Drug use: No    Review of Systems  Review of Systems  Constitutional: Positive for fatigue. Negative for fever.  HENT: Negative for congestion and sore throat.   Eyes: Negative for visual disturbance.  Respiratory: Positive for shortness of breath. Negative for cough.   Cardiovascular: Positive for chest pain and palpitations.  Gastrointestinal: Negative for abdominal pain, diarrhea, nausea and vomiting.  Genitourinary: Negative for flank pain.  Musculoskeletal: Negative for back pain and neck pain.  Skin: Negative for rash and wound.  Neurological: Positive for weakness.  All other systems reviewed and are negative.    ____________________________________________  PHYSICAL EXAM:  VITAL SIGNS: ED Triage Vitals  Enc Vitals Group     BP 03/24/19 1116 129/72     Pulse Rate 03/24/19 1141 75     Resp 03/24/19 1116 16     Temp 03/24/19 1116 97.9 F (36.6 C)     Temp Source 03/24/19 1116 Oral     SpO2 03/24/19 1116 92 %     Weight --      Height --      Head Circumference --      Peak Flow --      Pain Score 03/24/19 1116 1     Pain Loc --      Pain Edu? --      Excl. in [place]? --      Physical Exam Vitals signs and nursing note reviewed.  Constitutional:      General: She is not in acute distress.    Appearance: She is well-developed.  HENT:     Head: Normocephalic and atraumatic.  Eyes:     Conjunctiva/sclera: Conjunctivae normal.  Neck:     Musculoskeletal: Neck supple.  Cardiovascular:     Rate and Rhythm: Normal rate and regular rhythm.     Heart sounds: Normal heart sounds. No murmur. No friction rub.  Pulmonary:     Effort: Pulmonary effort is normal. No respiratory distress.     Breath sounds: Wheezing present. No rales.  Abdominal:     General: There is no distension.     Palpations: Abdomen is soft.     Tenderness:  There is no abdominal tenderness.  Skin:    General: Skin is warm.     Capillary Refill: Capillary refill takes less than 2 seconds.  Neurological:     Mental Status: She is alert and oriented to person, place, and time.     Motor: No abnormal muscle tone.       ____________________________________________   LABS (all labs ordered are listed, but only abnormal results are displayed)  Labs Reviewed  BASIC METABOLIC PANEL - Abnormal; Notable for the following components:      Result Value   Potassium 2.0 (*)    Chloride 94 (*)    Glucose, Bld 294 (*)    All other components within normal limits  CBC - Abnormal; Notable for the following components:   RBC 5.77 (*)    Hemoglobin 15.5 (*)    HCT 47.1 (*)    All other components within normal limits  MAGNESIUM    ____________________________________________  EKG: Normal sinus rhythm, VR 71. PR 130, QRS 90, QTc 460. Non-specific TW changes in lateral leads. No ischemic changes. ________________________________________  RADIOLOGY All imaging, including plain films, CT scans, and ultrasounds, independently reviewed by me, and interpretations confirmed via formal radiology reads.  ED MD interpretation:   CXR: Pending  Official radiology report(s): No results found.  ____________________________________________  PROCEDURES   Procedure(s) performed (including Critical Care):  Procedures  ____________________________________________  INITIAL IMPRESSION / MDM / [place] / ED COURSE  As part of my medical decision making, I reviewed the following data within the electronic MEDICAL RECORD NUMBER Notes from prior ED visits and [place] Controlled Substance Database      *DARCELLA SHIFFMAN was evaluated in Emergency Department on 03/24/2019 for the symptoms described in the history of present illness. She was evaluated in the context of the global COVID-19 pandemic, which necessitated consideration that the patient might be  at risk for infection with the SARS-CoV-2 virus that causes  COVID-19. Institutional protocols and algorithms that pertain to the evaluation of patients at risk for COVID-19 are in a state of rapid change based on information released by regulatory bodies including the CDC and federal and state organizations. These policies and algorithms were followed during the patient's care in the ED.  Some ED evaluations and interventions may be delayed as a result of limited staffing during the pandemic.*      Medical Decision Making: 68 yo F here with hypokalemia, palpitations, intermittent CP. Labs confirm K 2.0. Will give empiric K, PO and IV, and Mag. Pt wheezing on exam - no focal findings, satting well on RA with known COPD. Will be cautious with breathing tx until k is replaced. Admit to medicine for tele/replacement. I suspect this is combination of lasix use, poor PO intake, and chronic diarrheal losses.  ____________________________________________  FINAL CLINICAL IMPRESSION(S) / ED DIAGNOSES  Final diagnoses:  Hypokalemia  Palpitations     MEDICATIONS GIVEN DURING THIS VISIT:  Medications  sodium chloride flush (NS) 0.9 % injection 3 mL (3 mLs Intravenous Not Given 03/24/19 1250)  lactated ringers infusion ( Intravenous New Bag/Given 03/24/19 1258)  potassium chloride 10 mEq in 100 mL IVPB (has no administration in time range)  magnesium sulfate IVPB 2 g 50 mL (has no administration in time range)  potassium chloride SA (K-DUR) CR tablet 40 mEq (40 mEq Oral Given 03/24/19 1257)     ED Discharge Orders    None       Note:  This document was prepared using Dragon voice recognition software and may include unintentional dictation errors.   Duffy Bruce, MD 03/24/19 1302

## 2019-03-24 NOTE — Progress Notes (Signed)
Family Meeting Note  Advance Directive:yes  Today a meeting took place with the Patient.  The following clinical team members were present during this meeting:MD  The following were discussed:Patient's diagnosis: Chronic diastolic CHF, asthma, seizures, history of stroke, hypokalemia, Patient's progosis: Unable to determine and Goals for treatment: DNR  I discussed with patient with help of sign language translator, patient made it clear that she does not want to have any resuscitation and want to be DNR in any adverse event.  She has her daughter assigned as power of attorney.  Additional follow-up to be provided: PMD  Time spent during discussion: 16 min.  Karen Basta, MD

## 2019-03-24 NOTE — ED Notes (Signed)
PER [place] [facility] SHE WAS SENT HERE FOR POTASSIUM 2.4 (DRAWN YESTERDAY)

## 2019-03-25 LAB — CBC
HCT: 39.9 % (ref 36.0–46.0)
Hemoglobin: 13.2 g/dL (ref 12.0–15.0)
MCH: 27 pg (ref 26.0–34.0)
MCHC: 33.1 g/dL (ref 30.0–36.0)
MCV: 81.6 fL (ref 80.0–100.0)
Platelets: 195 10*3/uL (ref 150–400)
RBC: 4.89 MIL/uL (ref 3.87–5.11)
RDW: 12.7 % (ref 11.5–15.5)
WBC: 6 10*3/uL (ref 4.0–10.5)
nRBC: 0 % (ref 0.0–0.2)

## 2019-03-25 LAB — BASIC METABOLIC PANEL
Anion gap: 8 (ref 5–15)
BUN: 8 mg/dL (ref 8–23)
CO2: 32 mmol/L (ref 22–32)
Calcium: 8.2 mg/dL — ABNORMAL LOW (ref 8.9–10.3)
Chloride: 102 mmol/L (ref 98–111)
Creatinine, Ser: 0.44 mg/dL (ref 0.44–1.00)
GFR calc Af Amer: >60 mL/min (ref >60)
GFR calc non Af Amer: >60 mL/min (ref >60)
Glucose, Bld: 137 mg/dL — ABNORMAL HIGH (ref 70–99)
Potassium: 2.4 mmol/L — CL (ref 3.5–5.1)
Sodium: 142 mmol/L (ref 135–145)

## 2019-03-25 LAB — GLUCOSE, CAPILLARY
Glucose-Capillary: 187 mg/dL — ABNORMAL HIGH (ref 70–99)
Glucose-Capillary: 148 mg/dL — ABNORMAL HIGH (ref 70–99)
Glucose-Capillary: 204 mg/dL — ABNORMAL HIGH (ref 70–99)
Glucose-Capillary: 163 mg/dL — ABNORMAL HIGH (ref 70–99)

## 2019-03-25 LAB — HEMOGLOBIN A1C
Hgb A1c MFr Bld: 7.6 % — ABNORMAL HIGH (ref 4.8–5.6)
Mean Plasma Glucose: 171 mg/dL

## 2019-03-25 LAB — MAGNESIUM: Magnesium: 1.8 mg/dL (ref 1.7–2.4)

## 2019-03-25 LAB — POTASSIUM: Potassium: 3 mmol/L — ABNORMAL LOW (ref 3.5–5.1)

## 2019-03-25 LAB — SARS CORONAVIRUS 2 (TAT 6-24 HRS): SARS Coronavirus 2: NEGATIVE

## 2019-03-25 MED ORDER — POTASSIUM CHLORIDE CRYS ER 20 MEQ PO TBCR
40.0000 meq | EXTENDED_RELEASE_TABLET | ORAL | Status: AC
  Administered 2019-03-25 (×2): 40 meq via ORAL
  Filled 2019-03-25 (×2): qty 2

## 2019-03-25 MED ORDER — POTASSIUM CHLORIDE 10 MEQ/100ML IV SOLN
10.0000 meq | INTRAVENOUS | Status: AC
  Administered 2019-03-25: 10 meq via INTRAVENOUS

## 2019-03-25 MED ORDER — MECLIZINE HCL 25 MG PO TABS
25.0000 mg | ORAL_TABLET | Freq: Three times a day (TID) | ORAL | Status: DC | PRN
  Administered 2019-03-25 – 2019-03-26 (×2): 25 mg via ORAL
  Filled 2019-03-25 (×3): qty 1

## 2019-03-25 MED ORDER — POTASSIUM CHLORIDE 20 MEQ PO PACK
40.0000 meq | PACK | ORAL | Status: DC
  Administered 2019-03-25 (×2): 40 meq via ORAL
  Filled 2019-03-25 (×2): qty 2

## 2019-03-25 MED ORDER — POTASSIUM CHLORIDE 10 MEQ/100ML IV SOLN
10.0000 meq | INTRAVENOUS | Status: DC
  Administered 2019-03-25: 10 meq via INTRAVENOUS
  Filled 2019-03-25: qty 100

## 2019-03-25 MED ORDER — POTASSIUM CHLORIDE CRYS ER 20 MEQ PO TBCR
40.0000 meq | EXTENDED_RELEASE_TABLET | Freq: Once | ORAL | Status: AC
  Administered 2019-03-25: 13:00:00 40 meq via ORAL
  Filled 2019-03-25: qty 2

## 2019-03-25 NOTE — Progress Notes (Signed)
Pharmacy Electrolyte Monitoring Consult:  Pharmacy consulted to assist in monitoring and replacing electrolytes in this 68 y.o. female admitted on 03/24/2019 with Abnormal Lab hypokalemia- pt stopped taking K pills and continued lasix  Labs:  Sodium (mmol/L)  Date Value  03/25/2019 142  11/05/2013 139   Potassium (mmol/L)  Date Value  03/25/2019 3.0 (L)  11/05/2013 3.3 (L)   Magnesium (mg/dL)  Date Value  03/25/2019 1.8   Phosphorus (mg/dL)  Date Value  05/02/2017 2.2 (L)   Calcium (mg/dL)  Date Value  03/25/2019 8.2 (L)   Calcium, Total (mg/dL)  Date Value  11/05/2013 8.9   Albumin (g/dL)  Date Value  08/14/2017 3.9  11/05/2013 3.4   9/18  K 2.4  Mag 1.7 (9/17)  Scr 0.44 Patient has already received 59mq PO KCL x 2 doses this am (order for KCL 40 meq PO q4h x 3 doses by MD). Will order KCL 10 meq IV x 3 doses  Patient received Mag 2 gm IV x 1 yesterday 9/17. Will check K at 1500  Assessment/Plan: 9/18 1507  K 3.0.  Will order KCL 40 meq PO q4h x 2 doses. Will f/u electrolytes with am labs.   Sherhonda Gaspar A 03/25/2019 3:53 PM

## 2019-03-25 NOTE — Progress Notes (Addendum)
[place] [facility] [place] NAME: [name]    MR#:  [id number]  DATE OF BIRTH:  12/14/1950  SUBJECTIVE:  CHIEF COMPLAINT:   Chief Complaint  Patient presents with  . Abnormal Lab  Reports dizziness, wants to go home .  Uses sign language and whiteboard for communication REVIEW OF SYSTEMS:  Review of Systems  Constitutional: Positive for malaise/fatigue. Negative for diaphoresis, fever and weight loss.  HENT: Negative for ear discharge, ear pain, hearing loss, nosebleeds, sore throat and tinnitus.   Eyes: Negative for blurred vision and pain.  Respiratory: Negative for cough, hemoptysis, shortness of breath and wheezing.   Cardiovascular: Negative for chest pain, palpitations, orthopnea and leg swelling.  Gastrointestinal: Negative for abdominal pain, blood in stool, constipation, diarrhea, heartburn, nausea and vomiting.  Genitourinary: Negative for dysuria, frequency and urgency.  Musculoskeletal: Negative for back pain and myalgias.  Skin: Negative for itching and rash.  Neurological: Positive for dizziness. Negative for tingling, tremors, focal weakness, seizures, weakness and headaches.  Psychiatric/Behavioral: Negative for depression. The patient is not nervous/anxious.     DRUG ALLERGIES:   Allergies  Allergen Reactions  . Aspirin Itching  . Celebrex [Celecoxib] Itching    itching  . Ciprofloxacin Itching  . Codeine Itching  . Fosphenytoin Itching  . Levaquin [Levofloxacin In D5w] Itching  . Levofloxacin Itching  . Lovastatin Itching  . Penicillins     Documentation indicates severe reaction  Pt tolerated cephalosporin without adverse reaction 09/18   . Pravastatin Itching  . Sulfa Antibiotics Itching   VITALS:  Blood pressure (!) 148/69, pulse (!) 52, temperature 98 F (36.7 C), temperature source Oral, resp. rate 16, height _0  (1.6 m), weight 72.1 kg, SpO2 92 %. PHYSICAL EXAMINATION:  Physical  Exam HENT:     Head: Normocephalic and atraumatic.  Eyes:     Conjunctiva/sclera: Conjunctivae normal.     Pupils: Pupils are equal, round, and reactive to light.  Neck:     Musculoskeletal: Normal range of motion and neck supple.     Thyroid: No thyromegaly.     Trachea: No tracheal deviation.  Cardiovascular:     Rate and Rhythm: Normal rate and regular rhythm.     Heart sounds: Normal heart sounds.  Pulmonary:     Effort: Pulmonary effort is normal. No respiratory distress.     Breath sounds: Normal breath sounds. No wheezing.  Chest:     Chest wall: No tenderness.  Abdominal:     General: Bowel sounds are normal. There is no distension.     Palpations: Abdomen is soft.     Tenderness: There is no abdominal tenderness.  Musculoskeletal: Normal range of motion.  Skin:    General: Skin is warm and dry.     Findings: No rash.  Neurological:     Mental Status: She is alert and oriented to person, place, and time.     Cranial Nerves: No cranial nerve deficit.    LABORATORY PANEL:  Female CBC Recent Labs  Lab 03/25/19 0414  WBC 6.0  HGB 13.2  HCT 39.9  PLT 195   ------------------------------------------------------------------------------------------------------------------ Chemistries  Recent Labs  Lab 03/25/19 0414  NA 142  K 2.4*  CL 102  CO2 32  GLUCOSE 137*  BUN 8  CREATININE 0.44  CALCIUM 8.2*  MG 1.8   RADIOLOGY:  Dg Chest Portable 1 View  Result Date: 03/24/2019 CLINICAL DATA:  PER  [place] SHE WAS SENT HERE FOR POTASSIUM 2.4 (DRAWN YESTERDAY) hx of stroke, hypertension, diabetes, COPD, CHF, asthma. Current every day smoker EXAM: PORTABLE CHEST 1 VIEW COMPARISON:  03/01/2018 FINDINGS: Cardiac silhouette is normal in size. No mediastinal or hilar masses. No evidence of adenopathy. Lungs are clear.  No pleural effusion or pneumothorax. Skeletal structures are grossly intact. IMPRESSION: No active disease. Electronically Signed   By: Lajean Manes M.D.   On: 03/24/2019 13:56   ASSESSMENT AND PLAN:   *Hypokalemia Replete and recheck  *Chronic diastolic heart failure Well compensated at this time  *Diabetes Continue glipizide and keep on sliding scale coverage.  *Hyperlipidemia Continue rosuvastatin.  *Hypertension Continue home medications.  *COPD/asthma Currently no exacerbation symptoms continue home inhalers.  *Nonalcoholic liver cirrhosis Continue to monitor currently stable.  *Seizure disorder Continue Trileptal.  Continue Vimpat.  *Vertigo/dizziness -Meclizine as needed  We will get PT and OT eval   All the records are reviewed and case discussed with Care Management/Social Worker. Management plans discussed with the patient, nursing and they are in agreement.  CODE STATUS: Full Code  TOTAL TIME TAKING CARE OF THIS PATIENT: 35 minutes.   More than 50% of the time was spent in counseling/coordination of care: YES  POSSIBLE D/C IN 1-2 DAYS, DEPENDING ON CLINICAL CONDITION.   Max Sane M.D on 03/25/2019 at 12:00 PM  Between 7am to 6pm - Pager - [phone]  After 6pm go to [email or web address] - [employer] [facility]  Office  [phone]  CC: Primary care physician; Center, [facility]  Note: This dictation was prepared with Dragon dictation along with smaller phrase technology. Any transcriptional errors that result from this process are unintentional.

## 2019-03-25 NOTE — Progress Notes (Signed)
OT Cancellation Note  Patient Details Name: [name] MRN: [id number] DOB: July 29, 1950   Cancelled Treatment:    Reason Eval/Treat Not Completed: Medical issues which prohibited therapy. Consult received, chart reviewed. Pt noted with K+ of 2.4 which falls outside the guidelines for participation in therapy at this time. Will hold and re-attempt OT evaluation at later date/time as pt is medically appropriate.   Jeni Salles, MPH, MS, OTR/L ascom [phone] 03/25/19, 12:53 PM

## 2019-03-25 NOTE — Progress Notes (Signed)
Pharmacy Electrolyte Monitoring Consult:  Pharmacy consulted to assist in monitoring and replacing electrolytes in this 68 y.o. female admitted on 03/24/2019 with Abnormal Lab hypokalemia- pt stopped taking K pills and continued lasix  Labs:  Sodium (mmol/L)  Date Value  03/25/2019 142  11/05/2013 139   Potassium (mmol/L)  Date Value  03/25/2019 2.4 (LL)  11/05/2013 3.3 (L)   Magnesium (mg/dL)  Date Value  03/25/2019 1.8   Phosphorus (mg/dL)  Date Value  05/02/2017 2.2 (L)   Calcium (mg/dL)  Date Value  03/25/2019 8.2 (L)   Calcium, Total (mg/dL)  Date Value  11/05/2013 8.9   Albumin (g/dL)  Date Value  08/14/2017 3.9  11/05/2013 3.4    Assessment/Plan: 9/18  K 2.4  Mag 1.7 (9/17)  Scr 0.44 Patient has already received 45mq PO KCL x 2 doses this am (order for KCL 40 meq PO q4h x 3 doses by MD).  Will order KCL 10 meq IV x 3 doses  Patient received Mag 2 gm IV x 1 yesterday 9/17. Will check K at 1500 Will f/u electrolytes with am labs.   Nayib Remer A 03/25/2019 10:04 AM

## 2019-03-25 NOTE — Progress Notes (Signed)
PT Cancellation Note  Patient Details Name: [name] MRN: [id number] DOB: Jan 17, 1951   Cancelled Treatment:    Reason Eval/Treat Not Completed: Medical issues which prohibited therapy. Consult received, chart reviewed. Pt noted with K+ of 2.4 which falls outside the guidelines for participation in therapy at this time. Will hold and re-attempt PT evaluation at later date/time as pt is medically appropriate.   3:29 PM, 03/25/19 Etta Grandchild, PT, DPT Physical Therapist - [facility]  [phone] [facility])

## 2019-03-26 LAB — CBC
HCT: 41.3 % (ref 36.0–46.0)
Hemoglobin: 13.2 g/dL (ref 12.0–15.0)
MCH: 26.8 pg (ref 26.0–34.0)
MCHC: 32 g/dL (ref 30.0–36.0)
MCV: 83.9 fL (ref 80.0–100.0)
Platelets: 185 10*3/uL (ref 150–400)
RBC: 4.92 MIL/uL (ref 3.87–5.11)
RDW: 13.1 % (ref 11.5–15.5)
WBC: 5.4 10*3/uL (ref 4.0–10.5)
nRBC: 0 % (ref 0.0–0.2)

## 2019-03-26 LAB — BASIC METABOLIC PANEL
Anion gap: 8 (ref 5–15)
BUN: 12 mg/dL (ref 8–23)
CO2: 29 mmol/L (ref 22–32)
Calcium: 8.7 mg/dL — ABNORMAL LOW (ref 8.9–10.3)
Chloride: 107 mmol/L (ref 98–111)
Creatinine, Ser: 0.53 mg/dL (ref 0.44–1.00)
GFR calc Af Amer: >60 mL/min (ref >60)
GFR calc non Af Amer: >60 mL/min (ref >60)
Glucose, Bld: 139 mg/dL — ABNORMAL HIGH (ref 70–99)
Potassium: 4.4 mmol/L (ref 3.5–5.1)
Sodium: 144 mmol/L (ref 135–145)

## 2019-03-26 LAB — HIV ANTIBODY (ROUTINE TESTING W REFLEX): HIV Screen 4th Generation wRfx: NONREACTIVE

## 2019-03-26 LAB — GLUCOSE, CAPILLARY
Glucose-Capillary: 127 mg/dL — ABNORMAL HIGH (ref 70–99)
Glucose-Capillary: 145 mg/dL — ABNORMAL HIGH (ref 70–99)

## 2019-03-26 LAB — MAGNESIUM: Magnesium: 1.8 mg/dL (ref 1.7–2.4)

## 2019-03-26 MED ORDER — DOCUSATE SODIUM 100 MG PO CAPS: 100.0000 mg | ORAL_CAPSULE | Freq: Two times a day (BID) | ORAL | 0 refills | Status: DC | PRN

## 2019-03-26 MED ORDER — FUROSEMIDE 40 MG PO TABS
40.0000 mg | ORAL_TABLET | Freq: Once | ORAL | Status: AC
  Administered 2019-03-26: 40 mg via ORAL
  Filled 2019-03-26: qty 1

## 2019-03-26 MED ORDER — MECLIZINE HCL 25 MG PO TABS: 25.0000 mg | ORAL_TABLET | Freq: Three times a day (TID) | ORAL | 0 refills | Status: DC | PRN

## 2019-03-26 MED ORDER — POTASSIUM CHLORIDE ER 10 MEQ PO TBCR: 20.0000 meq | EXTENDED_RELEASE_TABLET | Freq: Every day | ORAL | 3 refills | Status: DC

## 2019-03-26 NOTE — Evaluation (Signed)
Physical Therapy Evaluation Patient Details Name: [name] MRN: [id number] DOB: Nov 22, 1950 Today's Date: 03/26/2019   History of Present Illness  [name]  is a 68 y.o. female with a known history of asthma, CHF, nonalcoholic liver cirrhosis, COPD, depression, diabetes, hypertension, neuropathy, seizures, sleep apnea, stroke-was prescribed Lasix and potassium tablets at home.  She feels that she is having a lot of urination due to potassium tablets and she stopped taking it 2 months ago. She does not have any major complaints except for some weakness in her legs and headache.  Clinical Impression  Pt admitted with above diagnosis. Pt currently with functional limitations due to the deficits listed below (see "PT Problem List"). Upon entry, pt in bed, awake and agreeable to participate. The pt is alert and oriented x4, pleasant, and generally a good historian. Modified independent bed mobility and transfers. AMB with minGuard assist 2/2 mild gait instability, pt reports as slightly worse than her baseline. Pt has a RW at home that she uses when she needs one.  Functional mobility assessment demonstrates increased effort/time requirements, poor tolerance, and need for physical assistance, whereas the patient performed these at a higher level of independence PTA. Pt will benefit from skilled PT intervention to increase independence and safety with basic mobility in preparation for discharge to the venue listed below. Pt is deaf and communication is facilitated through use of whiteboard.     Follow Up Recommendations Other (comment)(Pt recently DC from [place] at [facility], could return as she feels needec)    Equipment Recommendations  None recommended by PT    Recommendations for Other Services       Precautions / Restrictions Precautions Precautions: Fall Precaution Comments: very minimal unsteadiness on feet w/o AD Restrictions Weight Bearing Restrictions: No      Mobility  Bed  Mobility Overal bed mobility: Independent                Transfers Overall transfer level: Modified independent Equipment used: None             General transfer comment: additional time and effort needed minimally  Ambulation/Gait Ambulation/Gait assistance: Min guard Gait Distance (Feet): 175 Feet Assistive device: None Gait Pattern/deviations: WFL(Within Functional Limits);Staggering left;Staggering right     General Gait Details: several minimal LOB during AMB, self stabilized without use of UE  Stairs            Wheelchair Mobility    Modified Rankin (Stroke Patients Only)       Balance Overall balance assessment: Mild deficits observed, not formally tested;Modified Independent                                           Pertinent Vitals/Pain Pain Assessment: No/denies pain    Home Living Family/patient expects to be discharged to:: Private residence Living Arrangements: Children(son) Available Help at Discharge: Family;Available PRN/intermittently(son works) Type of Home: Apartment Home Access: Level entry     Home Layout: One level Home Equipment: Grab bars - tub/shower;Grab bars - toilet;Walker - 4 wheels;Wheelchair - manual;Cane - single point;Shower seat Additional Comments: Pt states she only utilizes shower seat out of all above listed equipment.    Prior Function Level of Independence: Independent         Comments: pt reports being independent with mobility and BADLs, states her son helps with IADLs and transportation.  Hand Dominance   Dominant Hand: Right    Extremity/Trunk Assessment   Upper Extremity Assessment Upper Extremity Assessment: Overall WFL for tasks assessed;RUE deficits/detail;LUE deficits/detail RUE Deficits / Details: completes full ROM, shoulder/elbow flex/ext 4-/5, grip 4/5 LUE Deficits / Details: completes full ROM, slightly less on L side, but still w/in fxl limits, shoulder/elbow  flex/ext 4-/5, grip 4/5    Lower Extremity Assessment Lower Extremity Assessment: Defer to PT evaluation;Overall [facility] for tasks assessed       Communication   Communication: Deaf(OT contacts nursing to ask about iPad interpreter communication system and RN advises that pt can read lips and respond appropriately, has also been using writing for communication. OT opts for written communication to ensrue safety given CV guidelines)  Cognition Arousal/Alertness: Awake/alert Behavior During Therapy: WFL for tasks assessed/performed Overall Cognitive Status: Within Functional Limits for tasks assessed                                        General Comments      Exercises Other Exercises Other Exercises: OT facilitated education re: safety including potential need for use of AD in home environment, at least temporarily upon d/c.   Assessment/Plan    PT Assessment Patient needs continued PT services  PT Problem List Decreased strength;Decreased activity tolerance;Decreased mobility       PT Treatment Interventions Gait training;DME instruction;Balance training;Functional mobility training;Therapeutic activities;Therapeutic exercise;Patient/family education    PT Goals (Current goals can be found in the Care Plan section)  Acute Rehab PT Goals Patient Stated Goal: to go home PT Goal Formulation: With patient Time For Goal Achievement: 04/09/19 Potential to Achieve Goals: Good    Frequency Min 2X/week   Barriers to discharge Inaccessible home environment      Co-evaluation               AM-PAC PT "6 Clicks" Mobility  Outcome Measure Help needed turning from your back to your side while in a flat bed without using bedrails?: None Help needed moving from lying on your back to sitting on the side of a flat bed without using bedrails?: None Help needed moving to and from a bed to a chair (including a wheelchair)?: None Help needed standing up from a chair  using your arms (e.g., wheelchair or bedside chair)?: None Help needed to walk in hospital room?: A Little Help needed climbing 3-5 steps with a railing? : A Little 6 Click Score: 22    End of Session Equipment Utilized During Treatment: Gait belt;Oxygen Activity Tolerance: Patient tolerated treatment well;No increased pain;Patient limited by fatigue Patient left: in bed;with call bell/phone within reach Nurse Communication: Mobility status PT Visit Diagnosis: Unsteadiness on feet (R26.81)    Time: 2550-0164 PT Time Calculation (min) (ACUTE ONLY): 19 min   Charges:   PT Evaluation $PT Eval Low Complexity: 1 Low          12:15 PM, 03/26/19 Etta Grandchild, PT, DPT Physical Therapist - [facility]  [phone] ([place])   Shanley Furlough C 03/26/2019, 12:12 PM

## 2019-03-26 NOTE — Discharge Summary (Signed)
[place] at [facility] NAME: [name]    MR#:  [id number]  DATE OF BIRTH:  05-Jan-1951  DATE OF ADMISSION:  03/24/2019 ADMITTING PHYSICIAN: Vaughan Basta, MD  DATE OF DISCHARGE:  03/26/19  PRIMARY CARE PHYSICIAN: [facility] [place]    ADMISSION DIAGNOSIS:  Palpitations [R00.2] Hypokalemia [E87.6]  DISCHARGE DIAGNOSIS:  Principal Problem:   Hypokalemia   SECONDARY DIAGNOSIS:   Past Medical History:  Diagnosis Date  . Asthma   . CHF (congestive heart failure) ([place])   . Cirrhosis, non-alcoholic ([place])   . COPD (chronic obstructive pulmonary disease) ([place])   . Deaf   . Depression   . Diabetes mellitus without complication ([place])   . GERD (gastroesophageal reflux disease)   . Heart murmur   . Hepatitis   . Hypertension   . Lymph node disorder    arm  . Neuropathy   . On home oxygen therapy    hs  . Orthopnea   . RLS (restless legs syndrome)   . Seizures ([place])   . Shortness of breath dyspnea   . Sleep apnea   . Stroke [facility])    [place] COURSE:  HISTORY OF PRESENT ILLNESS: [name]  is a 68 y.o. female with a known history of asthma, CHF, nonalcoholic liver cirrhosis, COPD, depression, diabetes, hypertension, neuropathy, seizures, sleep apnea, stroke ischemic-was prescribed Lasix and potassium tablets at home.  She feels that she is having a lot of urination due to potassium tablets and she stopped taking it 2 months ago by herself. She went to her primary care doctor for routine checkup.  She does not have any major complaints except for some weakness in her legs and headache. Her primary care doctor did the blood work and called her today to go to emergency room for low potassium. She was noted to have a potassium of 2 today in emergency room. Denies any other complaints.  She is deaf and her interview was done by me with help of sign language translator on iPad.  Hospital  course  *Hypokalemia Repleted and recheck potassium at 4.4 Resume home dose Lasix and potassium supplements 20 mEq once daily  *Chronic diastolic heart failure Well compensated at this time Monitor daily weights, intake and output Outpatient CHF rehab follow-up  *Diabetes type II Continue glipizide and keep on sliding scale coverage.  *Hyperlipidemia Continue rosuvastatin.  *Hypertension Continue home medication-statin  *COPD/asthma Currently no exacerbation symptoms continue home inhalers.  *Nonalcoholic liver cirrhosis Continue to monitor currently stable.  *Seizure disorder Continue Trileptal. Continue Vimpat.  *Vertigo/dizziness -Meclizine as needed Not feeling dizzy today PT has assessed the patient recommending home health PT patient already has walker at home  Communicated with patient using the white board DISCHARGE CONDITIONS:   Stable   CONSULTS OBTAINED:     PROCEDURES  NONE   DRUG ALLERGIES:   Allergies  Allergen Reactions  . Aspirin Itching  . Celebrex [Celecoxib] Itching    itching  . Ciprofloxacin Itching  . Codeine Itching  . Fosphenytoin Itching  . Levaquin [Levofloxacin In D5w] Itching  . Levofloxacin Itching  . Lovastatin Itching  . Penicillins     Documentation indicates severe reaction  Pt tolerated cephalosporin without adverse reaction 09/18   . Pravastatin Itching  . Sulfa Antibiotics Itching    DISCHARGE MEDICATIONS:   Allergies as of 03/26/2019      Reactions   Aspirin Itching   Celebrex [celecoxib] Itching  itching   Ciprofloxacin Itching   Codeine Itching   Fosphenytoin Itching   Levaquin [levofloxacin In D5w] Itching   Levofloxacin Itching   Lovastatin Itching   Penicillins    Documentation indicates severe reaction Pt tolerated cephalosporin without adverse reaction 09/18   Pravastatin Itching   Sulfa Antibiotics Itching      Medication List    STOP taking these medications   amLODipine 10  MG tablet Commonly known as: NORVASC   glipiZIDE XL 5 MG 24 hr tablet Generic drug: glipiZIDE   lacosamide 200 MG Tabs tablet Commonly known as: VIMPAT   mometasone-formoterol 200-5 MCG/ACT Aero Commonly known as: DULERA   rOPINIRole 0.25 MG tablet Commonly known as: REQUIP     TAKE these medications   acidophilus Caps capsule Take 1 capsule by mouth daily.   albuterol 108 (90 Base) MCG/ACT inhaler Commonly known as: VENTOLIN HFA Inhale 2 puffs into the lungs every 4 (four) hours as needed for wheezing or shortness of breath.   albuterol (2.5 MG/3ML) 0.083% nebulizer solution Commonly known as: PROVENTIL Take 3 mLs (2.5 mg total) by nebulization every 4 (four) hours as needed for wheezing or shortness of breath.   citalopram 10 MG tablet Commonly known as: CELEXA Take 10 mg by mouth daily.   clopidogrel 75 MG tablet Commonly known as: PLAVIX Take 1 tablet (75 mg total) by mouth daily.   diltiazem 180 MG 24 hr capsule Commonly known as: DILACOR XR Take 180 mg by mouth daily.   docusate sodium 100 MG capsule Commonly known as: COLACE Take 1 capsule (100 mg total) by mouth 2 (two) times daily as needed for mild constipation.   furosemide 40 MG tablet Commonly known as: LASIX Take 40 mg by mouth daily.   losartan 25 MG tablet Commonly known as: COZAAR Take 25 mg by mouth daily.   meclizine 25 MG tablet Commonly known as: ANTIVERT Take 1 tablet (25 mg total) by mouth 3 (three) times daily as needed for dizziness.   montelukast 10 MG tablet Commonly known as: SINGULAIR Take 1 tablet by mouth at bedtime.   omeprazole 20 MG capsule Commonly known as: PRILOSEC Take 20 mg by mouth daily.   Oxcarbazepine 300 MG tablet Commonly known as: TRILEPTAL Take 150 mg by mouth 2 (two) times daily.   potassium chloride 10 MEQ tablet Commonly known as: K-DUR Take 2 tablets (20 mEq total) by mouth daily. What changed: when to take this   rosuvastatin 10 MG  tablet Commonly known as: CRESTOR Take 10 mg by mouth daily.   Symbicort 160-4.5 MCG/ACT inhaler Generic drug: budesonide-formoterol Inhale 2 puffs into the lungs 2 (two) times daily.        DISCHARGE INSTRUCTIONS:  Follow-up with primary care physician in 3 days Home health PT  DIET:  Cardiac diet , carb modified  DISCHARGE CONDITION:  Fair  ACTIVITY:  Activity as tolerated  OXYGEN:  Home Oxygen: Yes.     Oxygen Delivery: 2  liters/min via Patient connected to nasal cannula oxygen  DISCHARGE LOCATION:  home   If you experience worsening of your admission symptoms, develop shortness of breath, life threatening emergency, suicidal or homicidal thoughts you must seek medical attention immediately by calling 911 or calling your MD immediately  if symptoms less severe.  You Must read complete instructions/literature along with all the possible adverse reactions/side effects for all the Medicines you take and that have been prescribed to you. Take any new Medicines after you have completely understood  and accpet all the possible adverse reactions/side effects.   Please note  You were cared for by a hospitalist during your hospital stay. If you have any questions about your discharge medications or the care you received while you were in the hospital after you are discharged, you can call the unit and asked to speak with the hospitalist on call if the hospitalist that took care of you is not available. Once you are discharged, your primary care physician will handle any further medical issues. Please note that NO REFILLS for any discharge medications will be authorized once you are discharged, as it is imperative that you return to your primary care physician (or establish a relationship with a primary care physician if you do not have one) for your aftercare needs so that they can reassess your need for medications and monitor your lab values.     Today  Chief Complaint   Patient presents with  . Abnormal Lab   Patient is feeling much better and reporting using the sign language.  Denies any dizziness today Desperately wanted to go home.  Seen by physical therapy recommending home health PT  ROS:  CONSTITUTIONAL: Denies fevers, chills. Denies any fatigue, weakness.  EYES: Denies blurry vision, double vision, eye pain. EARS, NOSE, THROAT: Denies tinnitus, ear pain, hearing loss. RESPIRATORY: Denies cough, wheeze, shortness of breath.  CARDIOVASCULAR: Denies chest pain, palpitations, edema.  GASTROINTESTINAL: Denies nausea, vomiting, diarrhea, abdominal pain. Denies bright red blood per rectum. GENITOURINARY: Denies dysuria, hematuria. ENDOCRINE: Denies nocturia or thyroid problems. HEMATOLOGIC AND LYMPHATIC: Denies easy bruising or bleeding. SKIN: Denies rash or lesion. MUSCULOSKELETAL: Denies pain in neck, back, shoulder, knees, hips or arthritic symptoms.  NEUROLOGIC: Denies paralysis, paresthesias.  PSYCHIATRIC: Denies anxiety or depressive symptoms.   VITAL SIGNS:  Blood pressure (!) 163/71, pulse (!) 52, temperature 98.3 F (36.8 C), resp. rate 16, height _0  (1.6 m), weight 72.1 kg, SpO2 91 %.  I/O:   No intake or output data in the 24 hours ending 03/28/19 1430  PHYSICAL EXAMINATION:  GENERAL:  68 y.o.-year-old patient lying in the bed with no acute distress.  EYES: Pupils equal, round, reactive to light and accommodation. No scleral icterus. Extraocular muscles intact.  HEENT: Head atraumatic, normocephalic. Oropharynx and nasopharynx clear.  NECK:  Supple, no jugular venous distention. No thyroid enlargement, no tenderness.  LUNGS: Normal breath sounds bilaterally, no wheezing, rales,rhonchi or crepitation. No use of accessory muscles of respiration.  CARDIOVASCULAR: S1, S2 normal. No murmurs, rubs, or gallops.  ABDOMEN: Soft, non-tender, non-distended. Bowel sounds present. No organomegaly or mass.  EXTREMITIES: No pedal edema,  cyanosis, or clubbing.  NEUROLOGIC: Cranial nerves II through XII are intact. Muscle strength 5/5 in all extremities. Sensation intact. Gait not checked.  PSYCHIATRIC: The patient is alert and oriented x 3.  SKIN: No obvious rash, lesion, or ulcer.   DATA REVIEW:   CBC Recent Labs  Lab 03/26/19 0704  WBC 5.4  HGB 13.2  HCT 41.3  PLT 185    Chemistries  Recent Labs  Lab 03/26/19 0704  NA 144  K 4.4  CL 107  CO2 29  GLUCOSE 139*  BUN 12  CREATININE 0.53  CALCIUM 8.7*  MG 1.8    Cardiac Enzymes No results for input(s): TROPONINI in the last 168 hours.  Microbiology Results  Results for orders placed or performed during the hospital encounter of 03/24/19  SARS CORONAVIRUS 2 (TAT 6-24 HRS) Nasopharyngeal Nasopharyngeal Swab     Status: None  Collection Time: 03/24/19  2:16 PM   Specimen: Nasopharyngeal Swab  Result Value Ref Range Status   SARS Coronavirus 2 NEGATIVE NEGATIVE Final    Comment: (NOTE) SARS-CoV-2 target nucleic acids are NOT DETECTED. The SARS-CoV-2 RNA is generally detectable in upper and lower respiratory specimens during the acute phase of infection. Negative results do not preclude SARS-CoV-2 infection, do not rule out co-infections with other pathogens, and should not be used as the sole basis for treatment or other patient management decisions. Negative results must be combined with clinical observations, patient history, and epidemiological information. The expected result is Negative. Fact Sheet for Patients: SugarRoll.be Fact Sheet for Healthcare Providers: https://www.woods-mathews.com/ This test is not yet approved or cleared by the [place] FDA and  has been authorized for detection and/or diagnosis of SARS-CoV-2 by FDA under an Emergency Use Authorization (EUA). This EUA will remain  in effect (meaning this test can be used) for the duration of the COVID-19 declaration under Section  56 4(b)(1) of the Act, 21 U.S.C. section 360bbb-3(b)(1), unless the authorization is terminated or revoked sooner. Performed at [facility], [place] [address] [place], [place] [address]     RADIOLOGY:  No results found.  EKG:   Orders placed or performed during the hospital encounter of 03/24/19  . ED EKG  . ED EKG  . EKG 12-Lead  . EKG 12-Lead  . ED EKG  . ED EKG  . EKG      Management plans discussed with the patient, and she is  in agreement.  CODE STATUS:     Code Status Orders  (From admission, onward)         Start     Ordered   03/24/19 1913  Full code  Continuous     03/24/19 1915        Code Status History    Date Active Date Inactive Code Status Order ID Comments User Context   03/24/2019 1727 03/24/2019 1915 DNR [id number]  Vaughan Basta, MD Inpatient   03/01/2018 1402 03/03/2018 1716 Full Code [id number]  Dustin Flock, MD ED   08/15/2017 0325 08/17/2017 1710 Full Code [id number]  Lance Coon, MD Inpatient   06/26/2017 0034 06/27/2017 1432 Full Code [id number]  Salary, Avel Peace, MD Inpatient   06/15/2017 2015 06/17/2017 1627 Full Code [id number]  Fritzi Mandes, MD Inpatient   06/15/2017 1847 06/15/2017 2015 DNR [id number]  Fritzi Mandes, MD ED   05/04/2017 0835 05/07/2017 1832 DNR [id number]  Max Sane, MD Inpatient   04/29/2017 1915 05/04/2017 0835 Full Code [id number]  Angela Adam, RN Inpatient   03/25/2017 0235 03/30/2017 1907 Full Code [id number]  Hugelmeyer, Screven, DO Inpatient   02/11/2017 2131 02/12/2017 1636 Full Code [id number]  Demetrios Loll, MD Inpatient   07/11/2016 1131 07/17/2016 1508 Partial Code [id number]  Marijo Conception, RN Inpatient   07/11/2016 0237 07/11/2016 1131 Full Code [id number]  Harvie Bridge, DO Inpatient   03/22/2016 2244 03/24/2016 1912 Partial Code [id number]  Toy Baker, MD Inpatient   03/29/2015 1130 03/31/2015 1421 Partial Code [id number]  Loletha Grayer, MD ED   Advance Care Planning Activity    Advance  Directive Documentation     Most Recent Value  Type of Advance Directive  Out of facility DNR (pink MOST or yellow form)  Pre-existing out of facility DNR order (yellow form or pink MOST form)  -  "MOST" Form in Place?  -      TOTAL TIME TAKING CARE OF THIS PATIENT: 65  minutes.   Note: This dictation was prepared with Dragon dictation along with smaller phrase technology. Any transcriptional errors that result from this process are unintentional.   _0 @  on 03/28/2019 at 2:30 PM  Between 7am to 6pm - Pager - [phone]  After 6pm go to [email or web address] - password [email or web address] [facility]  Office  [phone]  CC: Primary care physician; [facility]

## 2019-03-26 NOTE — Plan of Care (Signed)
  Problem: Education: Goal: Knowledge of General Education information will improve Description: Including pain rating scale, medication(s)/side effects and non-pharmacologic comfort measures 03/26/2019 1121 by Orvan Falconer, RN Outcome: Progressing 03/26/2019 1121 by Orvan Falconer, RN Outcome: Progressing   Problem: Health Behavior/Discharge Planning: Goal: Ability to manage health-related needs will improve 03/26/2019 1121 by Orvan Falconer, RN Outcome: Progressing 03/26/2019 1121 by Orvan Falconer, RN Outcome: Progressing   Problem: Clinical Measurements: Goal: Ability to maintain clinical measurements within normal limits will improve 03/26/2019 1121 by Orvan Falconer, RN Outcome: Progressing 03/26/2019 1121 by Orvan Falconer, RN Outcome: Progressing Goal: Will remain free from infection 03/26/2019 1121 by Orvan Falconer, RN Outcome: Progressing 03/26/2019 1121 by Orvan Falconer, RN Outcome: Progressing Goal: Diagnostic test results will improve 03/26/2019 1121 by Orvan Falconer, RN Outcome: Progressing 03/26/2019 1121 by Orvan Falconer, RN Outcome: Progressing Goal: Respiratory complications will improve 03/26/2019 1121 by Orvan Falconer, RN Outcome: Progressing 03/26/2019 1121 by Orvan Falconer, RN Outcome: Progressing Goal: Cardiovascular complication will be avoided 03/26/2019 1121 by Orvan Falconer, RN Outcome: Progressing 03/26/2019 1121 by Orvan Falconer, RN Outcome: Progressing   Problem: Activity: Goal: Risk for activity intolerance will decrease 03/26/2019 1121 by Orvan Falconer, RN Outcome: Progressing 03/26/2019 1121 by Orvan Falconer, RN Outcome: Progressing   Problem: Nutrition: Goal: Adequate nutrition will be maintained 03/26/2019 1121 by Orvan Falconer, RN Outcome: Progressing 03/26/2019 1121 by Orvan Falconer, RN Outcome: Progressing   Problem: Coping: Goal: Level of anxiety will  decrease 03/26/2019 1121 by Orvan Falconer, RN Outcome: Progressing 03/26/2019 1121 by Orvan Falconer, RN Outcome: Progressing   Problem: Elimination: Goal: Will not experience complications related to bowel motility 03/26/2019 1121 by Orvan Falconer, RN Outcome: Progressing 03/26/2019 1121 by Orvan Falconer, RN Outcome: Progressing Goal: Will not experience complications related to urinary retention 03/26/2019 1121 by Orvan Falconer, RN Outcome: Progressing 03/26/2019 1121 by Orvan Falconer, RN Outcome: Progressing   Problem: Pain Managment: Goal: General experience of comfort will improve 03/26/2019 1121 by Orvan Falconer, RN Outcome: Progressing 03/26/2019 1121 by Orvan Falconer, RN Outcome: Progressing   Problem: Safety: Goal: Ability to remain free from injury will improve 03/26/2019 1121 by Orvan Falconer, RN Outcome: Progressing 03/26/2019 1121 by Orvan Falconer, RN Outcome: Progressing   Problem: Skin Integrity: Goal: Risk for impaired skin integrity will decrease 03/26/2019 1121 by Orvan Falconer, RN Outcome: Progressing 03/26/2019 1121 by Orvan Falconer, RN Outcome: Progressing

## 2019-03-26 NOTE — TOC Progression Note (Signed)
Transition of Care [facility]) - Progression Note    Patient Details  Name: [name] MRN: [id number] Date of Birth: [birth year]  Transition of Care [facility]) CM/SW Contact  Shelbie Hutching, RN Phone Number: 03/26/2019, 4:26 PM  Clinical Narrative:     Patient discharged before [facility] was able to see.  RNCM attempted to call patient on both her home and mobile number.  RNCM also attempted to call patient's son with no answer.         Expected Discharge Plan and Services           Expected Discharge Date: 03/26/19                                     Social Determinants of Health (SDOH) Interventions    Readmission Risk Interventions No flowsheet data found.

## 2019-03-26 NOTE — Evaluation (Signed)
Occupational Therapy Evaluation Patient Details Name: [name] MRN: [id number] DOB: October 10, 1950 Today's Date: 03/26/2019    History of Present Illness Pt is 68 y/o F with PMH: asthma and sleep apnea-on home O2, CHF, NASH Cirrhosis, COPD, Depression, DM, HTN, Neuropathy, seizures, and TIA. Pt presented to PCP for routine appt with only c/o of some headaches and weakness in legs, bldwork revealed K+ of 2.0 and she was advised to go to ED.   Clinical Impression   Pt seen for OT evaluation this date. Prior to hospital admission, pt was Indep with all ADLs and her son assisted for some IADLs and transportation.  Pt lives with son in one level apartment with level entry.  Currently pt is able to sit EOB and perform LB dressing tasks I'ly. She demonstrates very slight imbalance with more dynamic standing tasks requiring SBA, but overall good safety awareness. Pt does not appear to require further OT in acute setting to address slight balance impairment (see below for any additional details) and has AD available to her at home for which OT provided safety education and recommended use. Upon hospital discharge, recommend pt discharge to home with family assistance PRN.    Follow Up Recommendations  No OT follow up;Supervision - Intermittent    Equipment Recommendations  None recommended by OT    Recommendations for Other Services       Precautions / Restrictions Precautions Precautions: Fall Precaution Comments: very minimal unsteadiness on feet w/o AD Restrictions Weight Bearing Restrictions: No      Mobility Bed Mobility Overal bed mobility: Independent                Transfers Overall transfer level: Independent Equipment used: None             General transfer comment: Pt performs sit to stand from EOB x2 trials. Initially, OT providing CGA for safety, but noted to have G static balance and completed second transfer with SBA only. Pt with very slight imbalance with  marchign in place, OT suggests use of her AD that she currently has available at home    Balance Overall balance assessment: Mild deficits observed, not formally tested                                         ADL either performed or assessed with clinical judgement   ADL Overall ADL's : At baseline                                             Vision Patient Visual Report: No change from baseline       Perception     Praxis      Pertinent Vitals/Pain Pain Assessment: No/denies pain     Hand Dominance Right   Extremity/Trunk Assessment Upper Extremity Assessment Upper Extremity Assessment: Overall WFL for tasks assessed;RUE deficits/detail;LUE deficits/detail RUE Deficits / Details: completes full ROM, shoulder/elbow flex/ext 4-/5, grip 4/5 LUE Deficits / Details: completes full ROM, slightly less on L side, but still w/in fxl limits, shoulder/elbow flex/ext 4-/5, grip 4/5   Lower Extremity Assessment Lower Extremity Assessment: Defer to PT evaluation;Overall [facility] for tasks assessed       Communication Communication Communication: Deaf(OT contacts nursing to ask about iPad interpreter communication system and RN  advises that pt can read lips and respond appropriately, has also been using writing for communication. OT opts for written communication to ensrue safety given CV guidelines)   Cognition Arousal/Alertness: Awake/alert Behavior During Therapy: WFL for tasks assessed/performed Overall Cognitive Status: Within Functional Limits for tasks assessed                                     General Comments       Exercises Other Exercises Other Exercises: OT facilitated education re: safety including potential need for use of AD in home environment, at least temporarily upon d/c.   Shoulder Instructions      Home Living Family/patient expects to be discharged to:: Private residence Living Arrangements:  Children(son) Available Help at Discharge: Family;Available PRN/intermittently(son works) Type of Home: Apartment Home Access: Level entry     Home Layout: One level     Bathroom Shower/Tub: Corporate investment banker: Standard     Home Equipment: Grab bars - tub/shower;Grab bars - toilet;Walker - 4 wheels;Wheelchair - manual;Cane - single point;Shower seat   Additional Comments: Pt states she only utilizes shower seat out of all above listed equipment.      Prior Functioning/Environment Level of Independence: Independent        Comments: pt reports being independent with mobility and BADLs, states her son helps with IADLs and transportation.        OT Problem List: Impaired balance (sitting and/or standing)      OT Treatment/Interventions:      OT Goals(Current goals can be found in the care plan section) Acute Rehab OT Goals Patient Stated Goal: to go home OT Goal Formulation: All assessment and education complete, DC therapy  OT Frequency:     Barriers to D/C:            Co-evaluation              AM-PAC OT "6 Clicks" Daily Activity     Outcome Measure Help from another person eating meals?: None Help from another person taking care of personal grooming?: None Help from another person toileting, which includes using toliet, bedpan, or urinal?: None Help from another person bathing (including washing, rinsing, drying)?: None(SBA would be preferable) Help from another person to put on and taking off regular upper body clothing?: None Help from another person to put on and taking off regular lower body clothing?: None 6 Click Score: 24   End of Session Equipment Utilized During Treatment: Gait belt  Activity Tolerance: Patient tolerated treatment well Patient left: in bed;with call bell/phone within reach  OT Visit Diagnosis: Unsteadiness on feet (R26.81)                Time: 1610-9604 OT Time Calculation (min): 42 min Charges:  OT  General Charges $OT Visit: 1 Visit OT Evaluation $OT Eval Low Complexity: 1 Low OT Treatments $Self Care/Home Management : 8-22 mins $Therapeutic Activity: 8-22 mins  Gerrianne Scale, MS, OTR/L ascom [phone] or [phone] 03/26/19, 11:34 AM

## 2019-03-26 NOTE — Discharge Instructions (Signed)
Follow-up with primary care physician in 3 days

## 2019-03-26 NOTE — Progress Notes (Signed)
Pharmacy Electrolyte Monitoring Consult:  Pharmacy consulted to assist in monitoring and replacing electrolytes in this 68 y.o. female admitted on 03/24/2019 with Abnormal Lab hypokalemia- pt stopped taking K pills and continued lasix  Labs:  Sodium (mmol/L)  Date Value  03/26/2019 144  11/05/2013 139   Potassium (mmol/L)  Date Value  03/26/2019 4.4  11/05/2013 3.3 (L)   Magnesium (mg/dL)  Date Value  03/26/2019 1.8   Phosphorus (mg/dL)  Date Value  05/02/2017 2.2 (L)   Calcium (mg/dL)  Date Value  03/26/2019 8.7 (L)   Calcium, Total (mg/dL)  Date Value  11/05/2013 8.9   Albumin (g/dL)  Date Value  08/14/2017 3.9  11/05/2013 3.4   Assessment/Plan: Electrolytes WNL. No supplementation is warranted today. Will f/u electrolytes with am labs.   [name], [facility] 03/26/2019 7:55 AM

## 2019-03-30 ENCOUNTER — Encounter: Payer: Medicare HMO | Admitting: Physical Therapy

## 2019-04-06 ENCOUNTER — Encounter: Payer: Medicare HMO | Admitting: Physical Therapy

## 2019-04-13 ENCOUNTER — Encounter: Payer: Medicare HMO | Admitting: Physical Therapy

## 2019-06-18 ENCOUNTER — Other Ambulatory Visit: Payer: Self-pay

## 2019-06-18 ENCOUNTER — Emergency Department: Payer: Medicare HMO

## 2019-06-18 ENCOUNTER — Emergency Department
Admission: EM | Admit: 2019-06-18 | Discharge: 2019-06-18 | Disposition: A | Discharge status: Home / Self Care | Payer: Medicare HMO | Attending: Emergency Medicine | Admitting: Emergency Medicine

## 2019-06-18 DIAGNOSIS — I110 Hypertensive heart disease with heart failure: Secondary | ICD-10-CM | POA: Diagnosis not present

## 2019-06-18 DIAGNOSIS — E876 Hypokalemia: Secondary | ICD-10-CM | POA: Insufficient documentation

## 2019-06-18 DIAGNOSIS — F17210 Nicotine dependence, cigarettes, uncomplicated: Secondary | ICD-10-CM | POA: Insufficient documentation

## 2019-06-18 DIAGNOSIS — I509 Heart failure, unspecified: Secondary | ICD-10-CM | POA: Diagnosis not present

## 2019-06-18 DIAGNOSIS — I10 Essential (primary) hypertension: Secondary | ICD-10-CM | POA: Diagnosis not present

## 2019-06-18 DIAGNOSIS — Z8673 Personal history of transient ischemic attack (TIA), and cerebral infarction without residual deficits: Secondary | ICD-10-CM | POA: Insufficient documentation

## 2019-06-18 DIAGNOSIS — J449 Chronic obstructive pulmonary disease, unspecified: Secondary | ICD-10-CM | POA: Diagnosis not present

## 2019-06-18 DIAGNOSIS — Z79899 Other long term (current) drug therapy: Secondary | ICD-10-CM | POA: Diagnosis not present

## 2019-06-18 DIAGNOSIS — E119 Type 2 diabetes mellitus without complications: Secondary | ICD-10-CM | POA: Diagnosis not present

## 2019-06-18 DIAGNOSIS — R799 Abnormal finding of blood chemistry, unspecified: Secondary | ICD-10-CM | POA: Diagnosis present

## 2019-06-18 LAB — BASIC METABOLIC PANEL
Anion gap: 14 (ref 5–15)
BUN: 13 mg/dL (ref 8–23)
CO2: 28 mmol/L (ref 22–32)
Calcium: 9 mg/dL (ref 8.9–10.3)
Chloride: 96 mmol/L — ABNORMAL LOW (ref 98–111)
Creatinine, Ser: 0.67 mg/dL (ref 0.44–1.00)
GFR calc Af Amer: >60 mL/min (ref >60)
GFR calc non Af Amer: >60 mL/min (ref >60)
Glucose, Bld: 395 mg/dL — ABNORMAL HIGH (ref 70–99)
Potassium: 2.5 mmol/L — CL (ref 3.5–5.1)
Sodium: 138 mmol/L (ref 135–145)

## 2019-06-18 LAB — CBC
HCT: 43 % (ref 36.0–46.0)
Hemoglobin: 15.1 g/dL — ABNORMAL HIGH (ref 12.0–15.0)
MCH: 27.1 pg (ref 26.0–34.0)
MCHC: 35.1 g/dL (ref 30.0–36.0)
MCV: 77.2 fL — ABNORMAL LOW (ref 80.0–100.0)
Platelets: 219 10*3/uL (ref 150–400)
RBC: 5.57 MIL/uL — ABNORMAL HIGH (ref 3.87–5.11)
RDW: 13.3 % (ref 11.5–15.5)
WBC: 5.3 10*3/uL (ref 4.0–10.5)
nRBC: 0 % (ref 0.0–0.2)

## 2019-06-18 LAB — TROPONIN I (HIGH SENSITIVITY)
Troponin I (High Sensitivity): 11 ng/L (ref <18)
Troponin I (High Sensitivity): 10 ng/L (ref <18)

## 2019-06-18 LAB — MAGNESIUM: Magnesium: 1.9 mg/dL (ref 1.7–2.4)

## 2019-06-18 MED ORDER — IPRATROPIUM-ALBUTEROL 0.5-2.5 (3) MG/3ML IN SOLN
3.0000 mL | Freq: Once | RESPIRATORY_TRACT | Status: AC
  Administered 2019-06-18: 21:00:00 3 mL via RESPIRATORY_TRACT
  Filled 2019-06-18: qty 3

## 2019-06-18 MED ORDER — SODIUM CHLORIDE 0.9% FLUSH: 3.0000 mL | Freq: Once | INTRAVENOUS | Status: DC

## 2019-06-18 MED ORDER — POTASSIUM CHLORIDE CRYS ER 20 MEQ PO TBCR
40.0000 meq | EXTENDED_RELEASE_TABLET | Freq: Once | ORAL | Status: AC
  Administered 2019-06-18: 19:00:00 40 meq via ORAL
  Filled 2019-06-18: qty 2

## 2019-06-18 MED ORDER — POTASSIUM CHLORIDE CRYS ER 20 MEQ PO TBCR
40.0000 meq | EXTENDED_RELEASE_TABLET | Freq: Once | ORAL | Status: AC
  Administered 2019-06-18: 40 meq via ORAL
  Filled 2019-06-18: qty 2

## 2019-06-18 NOTE — ED Notes (Signed)
First Nurse Note: Pt sent to ED because K+ was 2.6 yesterday

## 2019-06-18 NOTE — ED Notes (Signed)
Lauren [phone] Translator used ASL

## 2019-06-18 NOTE — ED Notes (Signed)
Pt ambulated to toilet. Well tolerated.

## 2019-06-18 NOTE — ED Notes (Addendum)
Pt states she is supposed to be on O2 therapy at home but that it "didn't happen". Pt asked if SOB and how many liters she is supposed to wear. Pt stated 2 L and was placed on [place] at 2L. Once place on O2 pt's Sat level went from 90% to 97-98%.

## 2019-06-18 NOTE — ED Notes (Signed)
Patient transported to X-ray 

## 2019-06-18 NOTE — ED Triage Notes (Signed)
Patient through interpreter states had lab work done in MD office and comes for abnormal results. States was drawn in MD office yesterday. Patient states has had some dizziness and SOB. States went to MD because her feet were painful and swollen. Denies fevers. Denies cough.

## 2019-06-18 NOTE — Discharge Instructions (Addendum)
Please seek medical attention for any high fevers, chest pain, shortness of breath, change in behavior, persistent vomiting, bloody stool or any other new or concerning symptoms.

## 2019-06-18 NOTE — ED Provider Notes (Signed)
[facility] Emergency Department Provider Note   ____________________________________________   I have reviewed the triage vital signs and the nursing notes.   HISTORY  Chief Complaint abnormal labs   History limited by: [place] utilized   HPI [name] is a 68 y.o. female who presents to the emergency department today because of concern for low potassium found on blood work performed by PCPs office yesterday. The patient states that she saw them because she was having some increased shortness of breath and leg discomfort. She does have baseline shortness of breath and has home oxygen she uses when needed. She says she also has breathing treatments at home. She was called by office today and told that her potassium level was low. She has had low potassium in the past. Additionally she has had some chest discomfort on and off for a little while. Describes it as pressure like. Located in her central chest. Denies any fevers.    Records reviewed. Per medical record review patient has a history of CHF, COPD.   Past Medical History:  Diagnosis Date  . Asthma   . CHF (congestive heart failure) ([place])   . Cirrhosis, non-alcoholic ([place])   . COPD (chronic obstructive pulmonary disease) ([place])   . Deaf   . Depression   . Diabetes mellitus without complication ([place])   . GERD (gastroesophageal reflux disease)   . Heart murmur   . Hepatitis   . Hypertension   . Lymph node disorder    arm  . Neuropathy   . On home oxygen therapy    hs  . Orthopnea   . RLS (restless legs syndrome)   . Seizures ([place])   . Shortness of breath dyspnea   . Sleep apnea   . Stroke [facility])    tia    Patient Active Problem List   Diagnosis Date Noted  . PNA (pneumonia) 03/01/2018  . Bronchitis 09/23/2017  . Chronic diastolic heart failure ([place]) 08/26/2017  . Tobacco use 08/26/2017  . Seizures ([place]) 06/15/2017  . Goals of care,  counseling/discussion 05/06/2017  . Status epilepticus ([place])   . CAP (community acquired pneumonia)   . Hypoxia   . Hypokalemia 02/12/2017  . GERD (gastroesophageal reflux disease) 03/23/2016  . Depression 03/23/2016  . DM (diabetes mellitus), type 2 ([place]) 03/22/2016  . Essential hypertension 03/22/2016  . Neuropathy 03/22/2016  . Convulsions ([place]) 03/22/2016  . COPD exacerbation ([place]) 03/31/2015    Past Surgical History:  Procedure Laterality Date  . CATARACT EXTRACTION W/PHACO Right 11/23/2014   Procedure: CATARACT EXTRACTION PHACO AND INTRAOCULAR LENS PLACEMENT (IOC);  Surgeon: Lyla Glassing, MD;  Location: [facility] ORS;  Service: Ophthalmology;  Laterality: Right;  . CATARACT EXTRACTION W/PHACO Left 12/14/2014   Procedure: CATARACT EXTRACTION PHACO AND INTRAOCULAR LENS PLACEMENT (IOC);  Surgeon: Lyla Glassing, MD;  Location: [facility] ORS;  Service: Ophthalmology;  Laterality: Left;  US:01:16.6 AP:15.8 CDE:12.14  . CESAREAN SECTION    . CHOLECYSTECTOMY    . KNEE ARTHROPLASTY    . THUMB ARTHROSCOPY    . TONSILLECTOMY    . TYMPANOPLASTY     muliple    Prior to Admission medications   Medication Sig Start Date End Date Taking? Authorizing Provider  acidophilus (RISAQUAD) CAPS capsule Take 1 capsule by mouth daily. 06/27/17   Gladstone Lighter, MD  albuterol (PROVENTIL HFA;VENTOLIN HFA) 108 (90 BASE) MCG/ACT inhaler Inhale 2 puffs into the lungs every 4 (four) hours as needed for wheezing or shortness of  breath.    [provider]  albuterol (PROVENTIL) (2.5 MG/3ML) 0.083% nebulizer solution Take 3 mLs (2.5 mg total) by nebulization every 4 (four) hours as needed for wheezing or shortness of breath. 09/23/17   Alisa Graff, FNP  citalopram (CELEXA) 10 MG tablet Take 10 mg by mouth daily. 03/02/19   [provider]  clopidogrel (PLAVIX) 75 MG tablet Take 1 tablet (75 mg total) by mouth daily. 07/17/16   Vaughan Basta, MD  diltiazem (DILACOR XR) 180 MG 24 hr  capsule Take 180 mg by mouth daily.    [provider]  docusate sodium (COLACE) 100 MG capsule Take 1 capsule (100 mg total) by mouth 2 (two) times daily as needed for mild constipation. 03/26/19   Nicholes Mango, MD  furosemide (LASIX) 40 MG tablet Take 40 mg by mouth daily. 07/22/18   [provider]  losartan (COZAAR) 25 MG tablet Take 25 mg by mouth daily. 03/02/19   [provider]  meclizine (ANTIVERT) 25 MG tablet Take 1 tablet (25 mg total) by mouth 3 (three) times daily as needed for dizziness. 03/26/19   Gouru, Illene Silver, MD  montelukast (SINGULAIR) 10 MG tablet Take 1 tablet by mouth at bedtime. 03/18/16   [provider]  omeprazole (PRILOSEC) 20 MG capsule Take 20 mg by mouth daily.    [provider]  Oxcarbazepine (TRILEPTAL) 300 MG tablet Take 150 mg by mouth 2 (two) times daily. 02/22/19   [provider]  potassium chloride (K-DUR) 10 MEQ tablet Take 2 tablets (20 mEq total) by mouth daily. 03/26/19 06/24/19  Nicholes Mango, MD  rosuvastatin (CRESTOR) 10 MG tablet Take 10 mg by mouth daily. 04/21/17   [provider]  SYMBICORT 160-4.5 MCG/ACT inhaler Inhale 2 puffs into the lungs 2 (two) times daily. 02/28/19   [provider]    Allergies Aspirin, Celebrex [celecoxib], Ciprofloxacin, Codeine, Fosphenytoin, Levaquin [levofloxacin in d5w], Levofloxacin, Lovastatin, Penicillins, Pravastatin, and Sulfa antibiotics  Family History  Problem Relation Age of Onset  . Lung cancer Mother   . CAD Father     Social History Social History   Tobacco Use  . Smoking status: Current Every Day Smoker    Packs/day: 0.50    Years: 50.00    Pack years: 25.00    Types: Cigarettes    Start date: 03/01/1968  . Smokeless tobacco: Never Used  Substance Use Topics  . Alcohol use: No  . Drug use: No    Review of Systems Constitutional: No fever/chills Eyes: No visual changes. ENT: No sore throat. Cardiovascular: Positive for  chest pain.  Respiratory: Positive for shortness of breath. Gastrointestinal: No abdominal pain.  No nausea, no vomiting.  No diarrhea.   Genitourinary: Negative for dysuria. Musculoskeletal: Positive for leg pain. Skin: Negative for rash. Neurological: Negative for headaches, focal weakness or numbness.  ____________________________________________   PHYSICAL EXAM:  VITAL SIGNS: ED Triage Vitals  Enc Vitals Group     BP 06/18/19 1542 135/60     Pulse Rate 06/18/19 1542 79     Resp 06/18/19 1542 18     Temp 06/18/19 1542 98.2 F (36.8 C)     Temp Source 06/18/19 1542 Oral     SpO2 06/18/19 1542 94 %     Weight 06/18/19 1559 155 lb (70.3 kg)     Height 06/18/19 1559 5' 3" (1.6 m)     Head Circumference --      Peak Flow --  Pain Score 06/18/19 1559 3   Constitutional: Alert and oriented.  Eyes: Conjunctivae are normal.  ENT      Head: Normocephalic and atraumatic.      Nose: No congestion/rhinnorhea.      Mouth/Throat: Mucous membranes are moist.      Neck: No stridor. Hematological/Lymphatic/Immunilogical: No cervical lymphadenopathy. Cardiovascular: Tachycardic, regular rhythm.  No murmurs, rubs, or gallops.  Respiratory: Normal respiratory effort without tachypnea nor retractions. Breath sounds are clear and equal bilaterally. No wheezes/rales/rhonchi. Gastrointestinal: Soft and non tender. No rebound. No guarding.  Genitourinary: Deferred Musculoskeletal: Normal range of motion in all extremities. No lower extremity edema. Neurologic:  Normal speech and language. No gross focal neurologic deficits are appreciated.  Skin:  Skin is warm, dry and intact. No rash noted.  ____________________________________________    LABS (pertinent positives/negatives)  CBC wbc 5.3, hgb 15.1, plt 219 BMP na 138, k 2.5, glu 395, cr 0.67 Trop hs 11 Magnesium 1.9 ____________________________________________   EKG  I, Nance Pear, attending physician, personally viewed  and interpreted this EKG  EKG Time: 1608 Rate: 132 Rhythm: sinus tachycardia with short pr Axis: normal Intervals: qtc 524 QRS: narrow ST changes: no st elevation, depression v3-v6 Impression: abnormal ekg  ____________________________________________    RADIOLOGY  CXR No acute abnromality  ____________________________________________   PROCEDURES  Procedures  ____________________________________________   INITIAL IMPRESSION / ASSESSMENT AND PLAN / ED COURSE  Pertinent labs & imaging results that were available during my care of the patient were reviewed by me and considered in my medical decision making (see chart for details).   Patient presented to the emergency department today because of concerns for low potassium found on outpatient blood work performed yesterday.  Patient had been complaining of some shortness of breath.  Potassium today was low at 2.5.  Initial EKG did show some diffuse ST depressions.  Was also found to be quite tachycardic.  Patient was given potassium here and her heart rate did slow significantly and the morphology improved significantly.  She was slightly tachypneic here.  Chest x-ray without any obvious pneumonia.  Patient states she did feel better and felt comfortable going home.  Discussed with patient importance of following up with primary care.  Also discussed with patient elevated blood sugar today.  She states that she went to a birthday party and ate a lot of sweets yesterday.  ____________________________________________   FINAL CLINICAL IMPRESSION(S) / ED DIAGNOSES  Final diagnoses:  Hypokalemia     Note: This dictation was prepared with Dragon dictation. Any transcriptional errors that result from this process are unintentional     Nance Pear, MD 06/18/19 2154

## 2019-08-05 ENCOUNTER — Inpatient Hospital Stay
Admission: EM | Admit: 2019-08-05 | Discharge: 2019-08-08 | DRG: 563 | Disposition: A | Discharge status: Home / Self Care | Payer: Medicare Other | Attending: Family Medicine | Admitting: Family Medicine

## 2019-08-05 ENCOUNTER — Emergency Department: Payer: Medicare Other

## 2019-08-05 ENCOUNTER — Encounter: Payer: Self-pay | Admitting: Emergency Medicine

## 2019-08-05 ENCOUNTER — Other Ambulatory Visit: Payer: Self-pay

## 2019-08-05 ENCOUNTER — Inpatient Hospital Stay: Payer: Medicare Other

## 2019-08-05 DIAGNOSIS — K7460 Unspecified cirrhosis of liver: Secondary | ICD-10-CM | POA: Diagnosis not present

## 2019-08-05 DIAGNOSIS — G40909 Epilepsy, unspecified, not intractable, without status epilepticus: Secondary | ICD-10-CM

## 2019-08-05 DIAGNOSIS — Z79899 Other long term (current) drug therapy: Secondary | ICD-10-CM

## 2019-08-05 DIAGNOSIS — E1140 Type 2 diabetes mellitus with diabetic neuropathy, unspecified: Secondary | ICD-10-CM | POA: Diagnosis present

## 2019-08-05 DIAGNOSIS — I5032 Chronic diastolic (congestive) heart failure: Secondary | ICD-10-CM | POA: Diagnosis present

## 2019-08-05 DIAGNOSIS — Z885 Allergy status to narcotic agent status: Secondary | ICD-10-CM | POA: Diagnosis not present

## 2019-08-05 DIAGNOSIS — Y92009 Unspecified place in unspecified non-institutional (private) residence as the place of occurrence of the external cause: Secondary | ICD-10-CM | POA: Diagnosis not present

## 2019-08-05 DIAGNOSIS — Z96659 Presence of unspecified artificial knee joint: Secondary | ICD-10-CM | POA: Diagnosis present

## 2019-08-05 DIAGNOSIS — I110 Hypertensive heart disease with heart failure: Secondary | ICD-10-CM | POA: Diagnosis present

## 2019-08-05 DIAGNOSIS — S42351A Displaced comminuted fracture of shaft of humerus, right arm, initial encounter for closed fracture: Secondary | ICD-10-CM | POA: Diagnosis not present

## 2019-08-05 DIAGNOSIS — F329 Major depressive disorder, single episode, unspecified: Secondary | ICD-10-CM | POA: Diagnosis present

## 2019-08-05 DIAGNOSIS — E785 Hyperlipidemia, unspecified: Secondary | ICD-10-CM | POA: Diagnosis present

## 2019-08-05 DIAGNOSIS — Z801 Family history of malignant neoplasm of trachea, bronchus and lung: Secondary | ICD-10-CM

## 2019-08-05 DIAGNOSIS — S42291A Other displaced fracture of upper end of right humerus, initial encounter for closed fracture: Principal | ICD-10-CM | POA: Diagnosis present

## 2019-08-05 DIAGNOSIS — E876 Hypokalemia: Secondary | ICD-10-CM | POA: Diagnosis not present

## 2019-08-05 DIAGNOSIS — F17210 Nicotine dependence, cigarettes, uncomplicated: Secondary | ICD-10-CM | POA: Diagnosis present

## 2019-08-05 DIAGNOSIS — R0601 Orthopnea: Secondary | ICD-10-CM | POA: Diagnosis present

## 2019-08-05 DIAGNOSIS — G4733 Obstructive sleep apnea (adult) (pediatric): Secondary | ICD-10-CM | POA: Diagnosis present

## 2019-08-05 DIAGNOSIS — Z9981 Dependence on supplemental oxygen: Secondary | ICD-10-CM

## 2019-08-05 DIAGNOSIS — J45909 Unspecified asthma, uncomplicated: Secondary | ICD-10-CM | POA: Insufficient documentation

## 2019-08-05 DIAGNOSIS — Z886 Allergy status to analgesic agent status: Secondary | ICD-10-CM

## 2019-08-05 DIAGNOSIS — R569 Unspecified convulsions: Secondary | ICD-10-CM | POA: Diagnosis not present

## 2019-08-05 DIAGNOSIS — Z880 Allergy status to penicillin: Secondary | ICD-10-CM

## 2019-08-05 DIAGNOSIS — W1830XA Fall on same level, unspecified, initial encounter: Secondary | ICD-10-CM | POA: Diagnosis present

## 2019-08-05 DIAGNOSIS — Z7902 Long term (current) use of antithrombotics/antiplatelets: Secondary | ICD-10-CM

## 2019-08-05 DIAGNOSIS — Z888 Allergy status to other drugs, medicaments and biological substances status: Secondary | ICD-10-CM | POA: Diagnosis not present

## 2019-08-05 DIAGNOSIS — G40919 Epilepsy, unspecified, intractable, without status epilepticus: Secondary | ICD-10-CM

## 2019-08-05 DIAGNOSIS — H9190 Unspecified hearing loss, unspecified ear: Secondary | ICD-10-CM | POA: Diagnosis present

## 2019-08-05 DIAGNOSIS — Z20822 Contact with and (suspected) exposure to covid-19: Secondary | ICD-10-CM | POA: Diagnosis present

## 2019-08-05 DIAGNOSIS — Z8673 Personal history of transient ischemic attack (TIA), and cerebral infarction without residual deficits: Secondary | ICD-10-CM

## 2019-08-05 DIAGNOSIS — G8911 Acute pain due to trauma: Secondary | ICD-10-CM

## 2019-08-05 DIAGNOSIS — Z882 Allergy status to sulfonamides status: Secondary | ICD-10-CM

## 2019-08-05 DIAGNOSIS — E119 Type 2 diabetes mellitus without complications: Secondary | ICD-10-CM | POA: Diagnosis not present

## 2019-08-05 DIAGNOSIS — K219 Gastro-esophageal reflux disease without esophagitis: Secondary | ICD-10-CM | POA: Diagnosis present

## 2019-08-05 DIAGNOSIS — K759 Inflammatory liver disease, unspecified: Secondary | ICD-10-CM | POA: Diagnosis present

## 2019-08-05 DIAGNOSIS — J449 Chronic obstructive pulmonary disease, unspecified: Secondary | ICD-10-CM | POA: Diagnosis not present

## 2019-08-05 DIAGNOSIS — I472 Ventricular tachycardia: Secondary | ICD-10-CM | POA: Diagnosis not present

## 2019-08-05 DIAGNOSIS — I639 Cerebral infarction, unspecified: Secondary | ICD-10-CM | POA: Diagnosis present

## 2019-08-05 DIAGNOSIS — F32A Depression, unspecified: Secondary | ICD-10-CM | POA: Diagnosis present

## 2019-08-05 DIAGNOSIS — Z7951 Long term (current) use of inhaled steroids: Secondary | ICD-10-CM

## 2019-08-05 DIAGNOSIS — G2581 Restless legs syndrome: Secondary | ICD-10-CM | POA: Diagnosis present

## 2019-08-05 DIAGNOSIS — Z8249 Family history of ischemic heart disease and other diseases of the circulatory system: Secondary | ICD-10-CM

## 2019-08-05 DIAGNOSIS — E860 Dehydration: Secondary | ICD-10-CM | POA: Diagnosis present

## 2019-08-05 DIAGNOSIS — W19XXXA Unspecified fall, initial encounter: Secondary | ICD-10-CM | POA: Diagnosis present

## 2019-08-05 DIAGNOSIS — I959 Hypotension, unspecified: Secondary | ICD-10-CM | POA: Diagnosis not present

## 2019-08-05 DIAGNOSIS — E1165 Type 2 diabetes mellitus with hyperglycemia: Secondary | ICD-10-CM | POA: Diagnosis not present

## 2019-08-05 DIAGNOSIS — I471 Supraventricular tachycardia: Secondary | ICD-10-CM | POA: Diagnosis present

## 2019-08-05 DIAGNOSIS — I10 Essential (primary) hypertension: Secondary | ICD-10-CM | POA: Diagnosis present

## 2019-08-05 DIAGNOSIS — S42201A Unspecified fracture of upper end of right humerus, initial encounter for closed fracture: Secondary | ICD-10-CM

## 2019-08-05 LAB — COMPREHENSIVE METABOLIC PANEL
ALT: 14 U/L (ref 0–44)
AST: 25 U/L (ref 15–41)
Albumin: 3.9 g/dL (ref 3.5–5.0)
Alkaline Phosphatase: 132 U/L — ABNORMAL HIGH (ref 38–126)
Anion gap: 13 (ref 5–15)
BUN: 11 mg/dL (ref 8–23)
CO2: 27 mmol/L (ref 22–32)
Calcium: 8.9 mg/dL (ref 8.9–10.3)
Chloride: 101 mmol/L (ref 98–111)
Creatinine, Ser: 0.53 mg/dL (ref 0.44–1.00)
GFR calc Af Amer: >60 mL/min (ref >60)
GFR calc non Af Amer: >60 mL/min (ref >60)
Glucose, Bld: 232 mg/dL — ABNORMAL HIGH (ref 70–99)
Potassium: 2.8 mmol/L — ABNORMAL LOW (ref 3.5–5.1)
Sodium: 141 mmol/L (ref 135–145)
Total Bilirubin: 1.1 mg/dL (ref 0.3–1.2)
Total Protein: 7.1 g/dL (ref 6.5–8.1)

## 2019-08-05 LAB — BLOOD GAS, VENOUS
Acid-Base Excess: 6.1 mmol/L — ABNORMAL HIGH (ref 0.0–2.0)
Bicarbonate: 31.8 mmol/L — ABNORMAL HIGH (ref 20.0–28.0)
FIO2: 0.21
O2 Saturation: 81.7 %
Patient temperature: 37
pCO2, Ven: 49 mmHg (ref 44.0–60.0)
pH, Ven: 7.42 (ref 7.250–7.430)
pO2, Ven: 45 mmHg (ref 32.0–45.0)

## 2019-08-05 LAB — CBC WITH DIFFERENTIAL/PLATELET
Abs Immature Granulocytes: 0.02 10*3/uL (ref 0.00–0.07)
Basophils Absolute: 0 10*3/uL (ref 0.0–0.1)
Basophils Relative: 1 %
Eosinophils Absolute: 0.1 10*3/uL (ref 0.0–0.5)
Eosinophils Relative: 1 %
HCT: 46.5 % — ABNORMAL HIGH (ref 36.0–46.0)
Hemoglobin: 15.3 g/dL — ABNORMAL HIGH (ref 12.0–15.0)
Immature Granulocytes: 0 %
Lymphocytes Relative: 39 %
Lymphs Abs: 3 10*3/uL (ref 0.7–4.0)
MCH: 27.1 pg (ref 26.0–34.0)
MCHC: 32.9 g/dL (ref 30.0–36.0)
MCV: 82.3 fL (ref 80.0–100.0)
Monocytes Absolute: 0.4 10*3/uL (ref 0.1–1.0)
Monocytes Relative: 6 %
Neutro Abs: 4 10*3/uL (ref 1.7–7.7)
Neutrophils Relative %: 53 %
Platelets: 225 10*3/uL (ref 150–400)
RBC: 5.65 MIL/uL — ABNORMAL HIGH (ref 3.87–5.11)
RDW: 13 % (ref 11.5–15.5)
WBC: 7.6 10*3/uL (ref 4.0–10.5)
nRBC: 0 % (ref 0.0–0.2)

## 2019-08-05 LAB — RESPIRATORY PANEL BY RT PCR (FLU A&B, COVID)
Influenza A by PCR: NEGATIVE
Influenza B by PCR: NEGATIVE
SARS Coronavirus 2 by RT PCR: NEGATIVE

## 2019-08-05 LAB — TYPE AND SCREEN
ABO/RH(D): A POS
Antibody Screen: NEGATIVE

## 2019-08-05 LAB — HEMOGLOBIN A1C
Hgb A1c MFr Bld: 7.9 % — ABNORMAL HIGH (ref 4.8–5.6)
Mean Plasma Glucose: 180.03 mg/dL

## 2019-08-05 LAB — MAGNESIUM: Magnesium: 1.5 mg/dL — ABNORMAL LOW (ref 1.7–2.4)

## 2019-08-05 LAB — TROPONIN I (HIGH SENSITIVITY)
Troponin I (High Sensitivity): 11 ng/L (ref <18)
Troponin I (High Sensitivity): 7 ng/L (ref <18)

## 2019-08-05 LAB — GLUCOSE, CAPILLARY
Glucose-Capillary: 216 mg/dL — ABNORMAL HIGH (ref 70–99)
Glucose-Capillary: 183 mg/dL — ABNORMAL HIGH (ref 70–99)
Glucose-Capillary: 151 mg/dL — ABNORMAL HIGH (ref 70–99)

## 2019-08-05 LAB — PHOSPHORUS: Phosphorus: 3.8 mg/dL (ref 2.5–4.6)

## 2019-08-05 LAB — APTT: aPTT: 28 seconds (ref 24–36)

## 2019-08-05 LAB — BRAIN NATRIURETIC PEPTIDE: B Natriuretic Peptide: 56 pg/mL (ref 0.0–100.0)

## 2019-08-05 LAB — PROTIME-INR
INR: 1.1 (ref 0.8–1.2)
Prothrombin Time: 13.6 seconds (ref 11.4–15.2)

## 2019-08-05 MED ORDER — SODIUM CHLORIDE 0.9 % IV SOLN
200.0000 mg | Freq: Once | INTRAVENOUS | Status: AC
  Administered 2019-08-05: 200 mg via INTRAVENOUS
  Filled 2019-08-05: qty 20

## 2019-08-05 MED ORDER — MAGNESIUM SULFATE IN D5W 1-5 GM/100ML-% IV SOLN
1.0000 g | Freq: Once | INTRAVENOUS | Status: AC
  Administered 2019-08-05: 1 g via INTRAVENOUS
  Filled 2019-08-05 (×2): qty 100

## 2019-08-05 MED ORDER — HYDRALAZINE HCL 25 MG PO TABS: 25.0000 mg | ORAL_TABLET | Freq: Three times a day (TID) | ORAL | Status: DC | PRN

## 2019-08-05 MED ORDER — MONTELUKAST SODIUM 10 MG PO TABS
10.0000 mg | ORAL_TABLET | Freq: Every day | ORAL | Status: DC
  Administered 2019-08-05 – 2019-08-07 (×3): 10 mg via ORAL
  Filled 2019-08-05 (×4): qty 1

## 2019-08-05 MED ORDER — ACETAMINOPHEN 650 MG RE SUPP: 650.0000 mg | Freq: Four times a day (QID) | RECTAL | Status: DC | PRN

## 2019-08-05 MED ORDER — INSULIN ASPART 100 UNIT/ML ~~LOC~~ SOLN: 0.0000 [IU] | Freq: Every day | SUBCUTANEOUS | Status: DC

## 2019-08-05 MED ORDER — ALBUTEROL SULFATE (2.5 MG/3ML) 0.083% IN NEBU: 2.5000 mg | INHALATION_SOLUTION | RESPIRATORY_TRACT | Status: DC | PRN

## 2019-08-05 MED ORDER — MAGNESIUM SULFATE IN D5W 1-5 GM/100ML-% IV SOLN
1.0000 g | Freq: Once | INTRAVENOUS | Status: AC
  Administered 2019-08-05: 1 g via INTRAVENOUS
  Filled 2019-08-05: qty 100

## 2019-08-05 MED ORDER — MORPHINE SULFATE (PF) 2 MG/ML IV SOLN: 1.0000 mg | INTRAVENOUS | Status: DC | PRN

## 2019-08-05 MED ORDER — ONDANSETRON HCL 4 MG PO TABS: 4.0000 mg | ORAL_TABLET | Freq: Four times a day (QID) | ORAL | Status: DC | PRN

## 2019-08-05 MED ORDER — AMLODIPINE BESYLATE 10 MG PO TABS
10.0000 mg | ORAL_TABLET | Freq: Every day | ORAL | Status: DC
  Filled 2019-08-05 (×2): qty 1

## 2019-08-05 MED ORDER — MOMETASONE FURO-FORMOTEROL FUM 200-5 MCG/ACT IN AERO
2.0000 | INHALATION_SPRAY | Freq: Two times a day (BID) | RESPIRATORY_TRACT | Status: DC
  Administered 2019-08-05 – 2019-08-08 (×6): 2 via RESPIRATORY_TRACT
  Filled 2019-08-05 (×2): qty 8.8

## 2019-08-05 MED ORDER — LOSARTAN POTASSIUM 25 MG PO TABS
25.0000 mg | ORAL_TABLET | Freq: Every day | ORAL | Status: DC
  Administered 2019-08-07 – 2019-08-08 (×2): 25 mg via ORAL
  Filled 2019-08-05 (×4): qty 1

## 2019-08-05 MED ORDER — ROSUVASTATIN CALCIUM 10 MG PO TABS
10.0000 mg | ORAL_TABLET | Freq: Every day | ORAL | Status: DC
  Administered 2019-08-07 – 2019-08-08 (×2): 10 mg via ORAL
  Filled 2019-08-05 (×3): qty 1

## 2019-08-05 MED ORDER — DILTIAZEM HCL ER COATED BEADS 180 MG PO CP24
180.0000 mg | ORAL_CAPSULE | Freq: Every day | ORAL | Status: DC
  Filled 2019-08-05 (×4): qty 1

## 2019-08-05 MED ORDER — PANTOPRAZOLE SODIUM 40 MG PO TBEC
40.0000 mg | DELAYED_RELEASE_TABLET | Freq: Every day | ORAL | Status: DC
  Administered 2019-08-07 – 2019-08-08 (×2): 40 mg via ORAL
  Filled 2019-08-05 (×2): qty 1

## 2019-08-05 MED ORDER — POTASSIUM CHLORIDE 10 MEQ/100ML IV SOLN
10.0000 meq | INTRAVENOUS | Status: AC
  Administered 2019-08-05 (×3): 10 meq via INTRAVENOUS
  Filled 2019-08-05 (×5): qty 100

## 2019-08-05 MED ORDER — SODIUM CHLORIDE 0.9% FLUSH
3.0000 mL | Freq: Two times a day (BID) | INTRAVENOUS | Status: DC
  Administered 2019-08-05 – 2019-08-08 (×6): 3 mL via INTRAVENOUS

## 2019-08-05 MED ORDER — HEPARIN SODIUM (PORCINE) 5000 UNIT/ML IJ SOLN
5000.0000 [IU] | Freq: Three times a day (TID) | INTRAMUSCULAR | Status: DC
  Administered 2019-08-05 – 2019-08-08 (×10): 5000 [IU] via SUBCUTANEOUS
  Filled 2019-08-05 (×10): qty 1

## 2019-08-05 MED ORDER — CITALOPRAM HYDROBROMIDE 20 MG PO TABS
10.0000 mg | ORAL_TABLET | Freq: Every day | ORAL | Status: DC
  Administered 2019-08-06 – 2019-08-08 (×3): 10 mg via ORAL
  Filled 2019-08-05 (×3): qty 1

## 2019-08-05 MED ORDER — LACOSAMIDE 50 MG PO TABS
50.0000 mg | ORAL_TABLET | Freq: Two times a day (BID) | ORAL | Status: DC
  Administered 2019-08-05 – 2019-08-08 (×6): 50 mg via ORAL
  Filled 2019-08-05 (×6): qty 1

## 2019-08-05 MED ORDER — POTASSIUM CHLORIDE CRYS ER 20 MEQ PO TBCR
40.0000 meq | EXTENDED_RELEASE_TABLET | Freq: Once | ORAL | Status: DC
  Filled 2019-08-05: qty 2

## 2019-08-05 MED ORDER — DOCUSATE SODIUM 100 MG PO CAPS: 100.0000 mg | ORAL_CAPSULE | Freq: Two times a day (BID) | ORAL | Status: DC | PRN

## 2019-08-05 MED ORDER — LORAZEPAM 2 MG/ML IJ SOLN
1.0000 mg | Freq: Once | INTRAMUSCULAR | Status: AC
  Administered 2019-08-05: 1 mg via INTRAVENOUS

## 2019-08-05 MED ORDER — OXYCODONE HCL 5 MG PO TABS
5.0000 mg | ORAL_TABLET | ORAL | Status: DC | PRN
  Administered 2019-08-05 – 2019-08-08 (×11): 5 mg via ORAL
  Filled 2019-08-05 (×11): qty 1

## 2019-08-05 MED ORDER — METOPROLOL TARTRATE 5 MG/5ML IV SOLN
5.0000 mg | INTRAVENOUS | Status: DC | PRN
  Administered 2019-08-06: 2.5 mg via INTRAVENOUS
  Filled 2019-08-05: qty 5

## 2019-08-05 MED ORDER — RISAQUAD PO CAPS
1.0000 | ORAL_CAPSULE | Freq: Every day | ORAL | Status: DC
  Administered 2019-08-06 – 2019-08-08 (×3): 1 via ORAL
  Filled 2019-08-05 (×4): qty 1

## 2019-08-05 MED ORDER — INSULIN ASPART 100 UNIT/ML ~~LOC~~ SOLN
0.0000 [IU] | Freq: Three times a day (TID) | SUBCUTANEOUS | Status: DC
  Administered 2019-08-05 – 2019-08-07 (×7): 2 [IU] via SUBCUTANEOUS
  Administered 2019-08-08: 13:00:00 5 [IU] via SUBCUTANEOUS
  Administered 2019-08-08: 08:00:00 2 [IU] via SUBCUTANEOUS
  Filled 2019-08-05 (×10): qty 1

## 2019-08-05 MED ORDER — IPRATROPIUM-ALBUTEROL 0.5-2.5 (3) MG/3ML IN SOLN
3.0000 mL | Freq: Three times a day (TID) | RESPIRATORY_TRACT | Status: DC
  Administered 2019-08-05 – 2019-08-08 (×8): 3 mL via RESPIRATORY_TRACT
  Filled 2019-08-05 (×8): qty 3

## 2019-08-05 MED ORDER — ACETAMINOPHEN 325 MG PO TABS: 650.0000 mg | ORAL_TABLET | Freq: Four times a day (QID) | ORAL | Status: DC | PRN

## 2019-08-05 MED ORDER — LACOSAMIDE 50 MG PO TABS: 100.0000 mg | ORAL_TABLET | Freq: Two times a day (BID) | ORAL | Status: DC

## 2019-08-05 MED ORDER — OXCARBAZEPINE 150 MG PO TABS
150.0000 mg | ORAL_TABLET | Freq: Two times a day (BID) | ORAL | Status: DC
  Administered 2019-08-05 – 2019-08-08 (×6): 150 mg via ORAL
  Filled 2019-08-05 (×9): qty 1

## 2019-08-05 MED ORDER — METHOCARBAMOL 500 MG PO TABS
500.0000 mg | ORAL_TABLET | Freq: Three times a day (TID) | ORAL | Status: DC | PRN
  Filled 2019-08-05: qty 1

## 2019-08-05 MED ORDER — LORAZEPAM 2 MG/ML IJ SOLN: 1.0000 mg | INTRAMUSCULAR | Status: DC | PRN

## 2019-08-05 MED ORDER — IPRATROPIUM BROMIDE 0.02 % IN SOLN
0.5000 mg | Freq: Four times a day (QID) | RESPIRATORY_TRACT | Status: DC
  Administered 2019-08-05: 0.5 mg via RESPIRATORY_TRACT
  Filled 2019-08-05: qty 2.5

## 2019-08-05 MED ORDER — SODIUM CHLORIDE 0.9 % IV SOLN: INTRAVENOUS | Status: DC

## 2019-08-05 MED ORDER — IPRATROPIUM BROMIDE HFA 17 MCG/ACT IN AERS
2.0000 | INHALATION_SPRAY | Freq: Four times a day (QID) | RESPIRATORY_TRACT | Status: DC
  Filled 2019-08-05: qty 12.9

## 2019-08-05 MED ORDER — ONDANSETRON HCL 4 MG/2ML IJ SOLN: 4.0000 mg | Freq: Four times a day (QID) | INTRAMUSCULAR | Status: DC | PRN

## 2019-08-05 MED ORDER — FENTANYL CITRATE (PF) 100 MCG/2ML IJ SOLN
50.0000 ug | Freq: Once | INTRAMUSCULAR | Status: AC
  Administered 2019-08-05: 50 ug via INTRAVENOUS
  Filled 2019-08-05: qty 2

## 2019-08-05 MED ORDER — MORPHINE SULFATE (PF) 2 MG/ML IV SOLN: 2.0000 mg | INTRAVENOUS | Status: DC | PRN

## 2019-08-05 MED ORDER — POTASSIUM CHLORIDE CRYS ER 10 MEQ PO TBCR
20.0000 meq | EXTENDED_RELEASE_TABLET | Freq: Every day | ORAL | Status: DC
  Administered 2019-08-07 – 2019-08-08 (×2): 20 meq via ORAL
  Filled 2019-08-05 (×3): qty 2

## 2019-08-05 NOTE — ED Triage Notes (Addendum)
Pt here for mechanical fall.  Hit head, no LOC. Takes plavix.  Pain mostly to right humerus but c/o pain in whole right arm.  Pt wears 2 L [place] at baseline   Pt is deaf, sign interpretor on stratus used [id number].

## 2019-08-05 NOTE — Progress Notes (Signed)
eeg completed ° °

## 2019-08-05 NOTE — ED Provider Notes (Signed)
[facility] EMERGENCY DEPARTMENT Provider Note   CSN: [id number] Arrival date & time: 08/05/19  4917     History Chief Complaint  Patient presents with  . Fall    [name] is a 69 y.o. female hx of CHF, COPD, DM, deafness, here presenting with fall.  Patient states that she was outside smoking this morning and had a mechanical fall and fell on the right arm .  She also hit her head as well .  She states that she is on Plavix at baseline .  She denies any chest pain or shortness of breath.  Patient was noted to have an oxygen of 90%.  She states that she wears 2 L nasal cannula for COPD at baseline.  She denies any sick contacts or chest pain or shortness of breath.  Denies any contacts sick with Covid in particular.  The history is provided by the patient.  History done with sign language translator via video   Level V caveat- AMS      Past Medical History:  Diagnosis Date  . Asthma   . CHF (congestive heart failure) ([place])   . Cirrhosis, non-alcoholic ([place])   . COPD (chronic obstructive pulmonary disease) ([place])   . Deaf   . Depression   . Diabetes mellitus without complication ([place])   . GERD (gastroesophageal reflux disease)   . Heart murmur   . Hepatitis   . Hypertension   . Lymph node disorder    arm  . Neuropathy   . On home oxygen therapy    hs  . Orthopnea   . RLS (restless legs syndrome)   . Seizures ([place])   . Shortness of breath dyspnea   . Sleep apnea   . Stroke [facility])    tia    Patient Active Problem List   Diagnosis Date Noted  . PNA (pneumonia) 03/01/2018  . Bronchitis 09/23/2017  . Chronic diastolic heart failure ([place]) 08/26/2017  . Tobacco use 08/26/2017  . Seizures ([place]) 06/15/2017  . Goals of care, counseling/discussion 05/06/2017  . Status epilepticus ([place])   . CAP (community acquired pneumonia)   . Hypoxia   . Hypokalemia 02/12/2017  . GERD (gastroesophageal reflux disease) 03/23/2016  . Depression 03/23/2016   . DM (diabetes mellitus), type 2 ([place]) 03/22/2016  . Essential hypertension 03/22/2016  . Neuropathy 03/22/2016  . Convulsions ([place]) 03/22/2016  . COPD exacerbation ([place]) 03/31/2015    Past Surgical History:  Procedure Laterality Date  . CATARACT EXTRACTION W/PHACO Right 11/23/2014   Procedure: CATARACT EXTRACTION PHACO AND INTRAOCULAR LENS PLACEMENT (IOC);  Surgeon: Lyla Glassing, MD;  Location: [facility] ORS;  Service: Ophthalmology;  Laterality: Right;  . CATARACT EXTRACTION W/PHACO Left 12/14/2014   Procedure: CATARACT EXTRACTION PHACO AND INTRAOCULAR LENS PLACEMENT (IOC);  Surgeon: Lyla Glassing, MD;  Location: [facility] ORS;  Service: Ophthalmology;  Laterality: Left;  US:01:16.6 AP:15.8 CDE:12.14  . CESAREAN SECTION    . CHOLECYSTECTOMY    . KNEE ARTHROPLASTY    . THUMB ARTHROSCOPY    . TONSILLECTOMY    . TYMPANOPLASTY     muliple     OB History   No obstetric history on file.     Family History  Problem Relation Age of Onset  . Lung cancer Mother   . CAD Father     Social History   Tobacco Use  . Smoking status: Current Every Day Smoker    Packs/day: 0.50    Years: 50.00    Pack years:  25.00    Types: Cigarettes    Start date: 03/01/1968  . Smokeless tobacco: Never Used  Substance Use Topics  . Alcohol use: No  . Drug use: No    Home Medications Prior to Admission medications   Medication Sig Start Date End Date Taking? Authorizing Provider  acidophilus (RISAQUAD) CAPS capsule Take 1 capsule by mouth daily. 06/27/17   Gladstone Lighter, MD  albuterol (PROVENTIL HFA;VENTOLIN HFA) 108 (90 BASE) MCG/ACT inhaler Inhale 2 puffs into the lungs every 4 (four) hours as needed for wheezing or shortness of breath.    [provider]  albuterol (PROVENTIL) (2.5 MG/3ML) 0.083% nebulizer solution Take 3 mLs (2.5 mg total) by nebulization every 4 (four) hours as needed for wheezing or shortness of breath. 09/23/17   Alisa Graff, FNP  citalopram (CELEXA) 10 MG  tablet Take 10 mg by mouth daily. 03/02/19   [provider]  clopidogrel (PLAVIX) 75 MG tablet Take 1 tablet (75 mg total) by mouth daily. 07/17/16   Vaughan Basta, MD  diltiazem (DILACOR XR) 180 MG 24 hr capsule Take 180 mg by mouth daily.    [provider]  docusate sodium (COLACE) 100 MG capsule Take 1 capsule (100 mg total) by mouth 2 (two) times daily as needed for mild constipation. 03/26/19   Nicholes Mango, MD  furosemide (LASIX) 40 MG tablet Take 40 mg by mouth daily. 07/22/18   [provider]  losartan (COZAAR) 25 MG tablet Take 25 mg by mouth daily. 03/02/19   [provider]  meclizine (ANTIVERT) 25 MG tablet Take 1 tablet (25 mg total) by mouth 3 (three) times daily as needed for dizziness. 03/26/19   Gouru, Illene Silver, MD  montelukast (SINGULAIR) 10 MG tablet Take 1 tablet by mouth at bedtime. 03/18/16   [provider]  omeprazole (PRILOSEC) 20 MG capsule Take 20 mg by mouth daily.    [provider]  Oxcarbazepine (TRILEPTAL) 300 MG tablet Take 150 mg by mouth 2 (two) times daily. 02/22/19   [provider]  potassium chloride (K-DUR) 10 MEQ tablet Take 2 tablets (20 mEq total) by mouth daily. 03/26/19 06/24/19  Nicholes Mango, MD  rosuvastatin (CRESTOR) 10 MG tablet Take 10 mg by mouth daily. 04/21/17   [provider]  SYMBICORT 160-4.5 MCG/ACT inhaler Inhale 2 puffs into the lungs 2 (two) times daily. 02/28/19   [provider]    Allergies    Aspirin, Celebrex [celecoxib], Ciprofloxacin, Codeine, Fosphenytoin, Levaquin [levofloxacin in d5w], Levofloxacin, Lovastatin, Penicillins, Pravastatin, and Sulfa antibiotics  Review of Systems   Review of Systems  Musculoskeletal:       R arm pain   All other systems reviewed and are negative.   Physical Exam Updated Vital Signs BP (!) 144/70   Pulse 64   Temp (!) 97.5 F (36.4 C) (Oral)   Resp (!) 24   SpO2 90%   Physical Exam Vitals and nursing  note reviewed.  HENT:     Head: Normocephalic.     Nose: Nose normal.     Mouth/Throat:     Mouth: Mucous membranes are moist.  Eyes:     Extraocular Movements: Extraocular movements intact.     Pupils: Pupils are equal, round, and reactive to light.  Cardiovascular:     Rate and Rhythm: Normal rate and regular rhythm.     Heart sounds: Normal heart sounds.  Pulmonary:     Effort: Pulmonary effort is normal.     Breath sounds:  Normal breath sounds.  Abdominal:     General: Abdomen is flat.     Palpations: Abdomen is soft.  Musculoskeletal:     Cervical back: Normal range of motion.     Comments: R humerus area with tenderness, ? Deformity, no elbow or forearm tenderness. No spinal tenderness, pelvis stable, nl ROM bilateral hips. Abrasion L foot   Skin:    General: Skin is warm.     Capillary Refill: Capillary refill takes less than 2 seconds.  Neurological:     General: No focal deficit present.     Mental Status: She is alert.  Psychiatric:        Mood and Affect: Mood normal.        Behavior: Behavior normal.     ED Results / Procedures / Treatments   Labs (all labs ordered are listed, but only abnormal results are displayed) Labs Reviewed  CBC WITH DIFFERENTIAL/PLATELET  COMPREHENSIVE METABOLIC PANEL  MAGNESIUM  BLOOD GAS, VENOUS  TROPONIN I (HIGH SENSITIVITY)    EKG EKG Interpretation  Date/Time:  Friday August 05 2019 08:18:15 EST Ventricular Rate:  61 PR Interval:    QRS Duration: 112 QT Interval:  563 QTC Calculation: 568 R Axis:   44 Text Interpretation: Sinus rhythm Borderline intraventricular conduction delay Borderline repolarization abnormality Prolonged QT interval Baseline wander in lead(s) V6 No significant change since last tracing Confirmed by Wandra Arthurs [phone]) on 08/05/2019 8:23:19 AM   Radiology No results found.  Procedures Procedures (including critical care time)  CRITICAL CARE Performed by: Wandra Arthurs   Total critical care  time: 30 minutes  Critical care time was exclusive of separately billable procedures and treating other patients.  Critical care was necessary to treat or prevent imminent or life-threatening deterioration.  Critical care was time spent personally by me on the following activities: development of treatment plan with patient and/or surrogate as well as nursing, discussions with consultants, evaluation of patient's response to treatment, examination of patient, obtaining history from patient or surrogate, ordering and performing treatments and interventions, ordering and review of laboratory studies, ordering and review of radiographic studies, pulse oximetry and re-evaluation of patient's condition.   Medications Ordered in ED Medications  fentaNYL (SUBLIMAZE) injection 50 mcg (has no administration in time range)    ED Course  I have reviewed the triage vital signs and the nursing notes.  Pertinent labs & imaging results that were available during my care of the patient were reviewed by me and considered in my medical decision making (see chart for details).    MDM Rules/Calculators/A&P                      ERLENE DEVITA is a 69 y.o. female here with fall. Has R upper arm pain and deformity. Consider humerus fracture vs contusion. Will get labs, xrays, CT head/neck. Will give pain meds.    8:30 AM I was called into the room since staff noted that patient may have a seizure. She is tachycardic to 120s. She is looking upwards and is stiff and unresponsive. Upon chart review, patient had hx of seizures and was admitted and intubated in 2018 for intractable seizures confirmed on EEG. She is on trileptal and no longer taking vimpat and depakote. Will get CT head and consult neuro   9:30 AM Dr. Irish Elders from neuro called. He recommend Vimpat 200 mg IV then 100 mg BID. He thought seizure vs pseudoseizure. Patient had 2 more episodes  of seizure like activity that resolved with ativan.    9:54 AM CT head showed no bleed. Labs showed K 2.8, Mag 1.5, supplemented. Xray showed humerus fracture. Dr. Mack Guise from ortho consulted and recommend shoulder immobilizer. Hospitalist to admit for intractable seizures, proximal humerus fracture, hypokalemia.    Final Clinical Impression(s) / ED Diagnoses Final diagnoses:  None    Rx / DC Orders ED Discharge Orders    None       Drenda Freeze, MD 08/05/19 [phone]

## 2019-08-05 NOTE — Progress Notes (Signed)
Son Herbie Baltimore at bedside, Navigator completed.

## 2019-08-05 NOTE — Procedures (Addendum)
Patient Name: [name]  MRN: [id number]  Epilepsy Attending: Lora Havens  Referring Physician/Provider: Dr Leotis Pain Date: 08/05/2019 Duration: 26.07 minutes  Patient history: 69 year old female with history of seizure-like episodes (also concern for nonepileptic events) presented with fall.  On exam was noted to have right upper extremity shaking movements briefly with eye movement fluctuations.  EEG to evaluate for seizures.  Level of alertness: Awake, asleep  AEDs during EEG study: Oxcarbazepine, Vimpat  Technical aspects: This EEG study was done with scalp electrodes positioned according to the 10-20 International system of electrode placement. Electrical activity was acquired at a sampling rate of _0  and reviewed with a high frequency filter of _1  and a low frequency filter of _2 . EEG data were recorded continuously and digitally stored.   Description: The posterior dominant rhythm consists of 8 Hz activity of moderate voltage (25-35 uV) seen predominantly in posterior head regions, symmetric and reactive to eye opening and eye closing.  Sleep was characterized by sleep spindles (12 to 14 Hz), maximal frontocentral.  Physiologic photic driving was not seen during photic stimulation.  Hyperventilation was not performed.         IMPRESSION: This study is within normal limits. No seizures or epileptiform discharges were seen throughout the recording.  Ausencio Vaden [name]

## 2019-08-05 NOTE — ED Notes (Addendum)
Pt now tachycardic in 120s and having tachypnea in 40s.  Pt eyes twitching and appears to be having a focal seizure, not responding; lasted approximately 45-60 seconds. Dr Darl Householder called to room.  Will treat pt per orders. No hx of seizures.

## 2019-08-05 NOTE — H&P (Signed)
History and Physical    [name] NFA:[id number] DOB: 11-22-1950 DOA: 08/05/2019  **Will admit patient based on the expectation that the patient will need hospitalization/ hospital care that crosses at least 2 midnights  PCP: [facility]   Attending physician: Blaine Hamper  Patient coming from/Resides with: Private residence  Chief Complaint: Right upper arm pain after mechanical fall at home; subsequent witnessed seizure activity while in ER  HPI: [name] is a 69 y.o. female with medical history significant for ongoing tobacco abuse and COPD with chronic hypoxemia on 2 L oxygen at home, chronic diastolic heart failure, diabetes mellitus, hypertension, [place] cirrhosis, and restless leg syndrome/peripheral neuropathy.  Patient went outside of her home to smoke this morning and had a mechanical fall.  She apparently hit her head but did not experience any loss of consciousness.  She is complaining of right arm discomfort.  Initial work-up in the ER showed no traumatic injuries to the head, neck or pelvis.  Right upper extremity x-ray did reveal impacted comminuted subluxed fracture of the right humeral head.  Orthopedic services/Dr. Mack Guise has been consulted by EDP Dr. Darl Householder.  Patient's potassium was noted to be low at 2.8 and potassium IV boluses have been ordered.  Magnesium low at 1.5 and a 1 g IV bolus has also been ordered by EDP.  While in the ER patient had 3 witnessed seizures consisting of focal twitching of the eyes and face accompanied by tachypnea and tachycardia.  Seizure activity terminated with IV Ativan.  EDP spoke with neurologist/Zeylikman who recommended that patient be given a 200 mg bolus of IV Vimpat followed by 100 mg twice daily.  Patient has had no further seizure activity thus far while in the ER.  Of note in reviewing discharge information from September 2020 visit patient was supposed to be on both Trileptal and Vimpat but the [facility] med rec was  only taking Trileptal prior to admission.  Upon my evaluation of the patient she was awake but experiencing significant pain in the right upper extremity.  She was witnessed continually reaching for shoulder and right upper extremity and moaning.  She has previously been given IV fentanyl by EDP.  Will allow one-time dose of oxycodone with a sip of water since timing of surgical procedure unknown.  ED Course:  Vital Signs: BP (!) 152/64   Pulse 60   Temp (!) 97.5 F (36.4 C) (Oral)   Resp (!) 31   SpO2 96%   Review of Systems:  **History limited by patient's underlying deafness.  Unable to utilize sign Ecologist given patient's acute arm fracture and inability to sign.  Able to obtain history utilizing physical gestures as well as facial gestures  In addition to the HPI above,  No Fever-chills, myalgias or other constitutional symptoms No Headache, changes with Vision or hearing, new weakness, tingling, numbness in any extremity, dizziness, dysarthria or word finding difficulty, gait disturbance or imbalance, no apparent recurrent seizure activity while at home but did have witnessed seizure activity in the ER No problems swallowing food or Liquids, indigestion/reflux, choking or coughing while eating, abdominal pain with or after eating No Chest pain, + chronic cough without shortness of Breath, palpitations, orthopnea or DOE-on chronic oxygen 2 L/min No Abdominal pain, N/V, melena,hematochezia, dark tarry stools, constipation No dysuria, malodorous urine, hematuria or flank pain No new skin rashes, lesions, masses or bruises, No new joint pains, aches, swelling or redness No recent unintentional weight gain or loss No  polyuria, polydypsia or polyphagia   Past Medical History:  Diagnosis Date  . Asthma   . CHF (congestive heart failure) ([place])   . Cirrhosis, non-alcoholic ([place])   . COPD (chronic obstructive pulmonary disease) ([place])   . Deaf   . Depression   . Diabetes  mellitus without complication ([place])   . GERD (gastroesophageal reflux disease)   . Heart murmur   . Hepatitis   . Hypertension   . Lymph node disorder    arm  . Neuropathy   . On home oxygen therapy    hs  . Orthopnea   . RLS (restless legs syndrome)   . Seizures ([place])   . Shortness of breath dyspnea   . Sleep apnea   . Stroke [facility])    tia    Past Surgical History:  Procedure Laterality Date  . CATARACT EXTRACTION W/PHACO Right 11/23/2014   Procedure: CATARACT EXTRACTION PHACO AND INTRAOCULAR LENS PLACEMENT (IOC);  Surgeon: Lyla Glassing, MD;  Location: [facility] ORS;  Service: Ophthalmology;  Laterality: Right;  . CATARACT EXTRACTION W/PHACO Left 12/14/2014   Procedure: CATARACT EXTRACTION PHACO AND INTRAOCULAR LENS PLACEMENT (IOC);  Surgeon: Lyla Glassing, MD;  Location: [facility] ORS;  Service: Ophthalmology;  Laterality: Left;  US:01:16.6 AP:15.8 CDE:12.14  . CESAREAN SECTION    . CHOLECYSTECTOMY    . KNEE ARTHROPLASTY    . THUMB ARTHROSCOPY    . TONSILLECTOMY    . TYMPANOPLASTY     muliple    Social History   Socioeconomic History  . Marital status: Widowed    Spouse name: Not on file  . Number of children: Not on file  . Years of education: Not on file  . Highest education level: Not on file  Occupational History  . Not on file  Tobacco Use  . Smoking status: Current Every Day Smoker    Packs/day: 0.50    Years: 50.00    Pack years: 25.00    Types: Cigarettes    Start date: 03/01/1968  . Smokeless tobacco: Never Used  Substance and Sexual Activity  . Alcohol use: No  . Drug use: No  . Sexual activity: Not Currently  Other Topics Concern  . 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    Social Determinants of Health   Financial Resource Strain:   . Difficulty of Paying  Living Expenses: Not on file  Food Insecurity:   . Worried About Charity fundraiser in the Last Year: Not on file  . Ran Out of Food in the Last Year: Not on file  Transportation Needs:   . Lack of Transportation (Medical): Not on file  . Lack of Transportation (Non-Medical): Not on file  Physical Activity:   . Days of Exercise per Week: Not on file  . Minutes of Exercise per Session: Not on file  Stress:   . Feeling of Stress : Not on file  Social Connections:   . Frequency of Communication with Friends and Family: Not on file  . Frequency of Social Gatherings with Friends and Family: Not on file  . Attends Religious Services: Not on file  . Active Member of Clubs or Organizations: Not on file  . Attends Archivist Meetings: Not on file  . Marital  Status: Not on file  Intimate Partner Violence:   . Fear of Current or Ex-Partner: Not on file  . Emotionally Abused: Not on file  . Physically Abused: Not on file  . Sexually Abused: Not on file    Mobility: Independent Work history: Not obtained   Allergies  Allergen Reactions  . Aspirin Itching  . Celebrex [Celecoxib] Itching    itching  . Ciprofloxacin Itching  . Codeine Itching  . Fosphenytoin Itching  . Levaquin [Levofloxacin In D5w] Itching  . Levofloxacin Itching  . Lovastatin Itching  . Penicillins     Documentation indicates severe reaction  Pt tolerated cephalosporin without adverse reaction 09/18   . Pravastatin Itching  . Sulfa Antibiotics Itching    Family History  Problem Relation Age of Onset  . Lung cancer Mother   . CAD Father      Prior to Admission medications   Medication Sig Start Date End Date Taking? Authorizing Provider  acidophilus (RISAQUAD) CAPS capsule Take 1 capsule by mouth daily. 06/27/17  Yes Gladstone Lighter, MD  amLODipine (NORVASC) 10 MG tablet Take 10 mg by mouth daily. 05/04/19  Yes [provider]  citalopram (CELEXA) 10 MG tablet Take 10 mg by mouth  daily. 03/02/19  Yes [provider]  clopidogrel (PLAVIX) 75 MG tablet Take 1 tablet (75 mg total) by mouth daily. 07/17/16  Yes Vaughan Basta, MD  diltiazem (CARDIZEM CD) 180 MG 24 hr capsule Take 180 mg by mouth daily. 03/29/19  Yes [provider]  furosemide (LASIX) 40 MG tablet Take 40 mg by mouth daily. 07/22/18  Yes [provider]  losartan (COZAAR) 25 MG tablet Take 25 mg by mouth daily. 03/02/19  Yes [provider]  montelukast (SINGULAIR) 10 MG tablet Take 1 tablet by mouth at bedtime. 03/18/16  Yes [provider]  omeprazole (PRILOSEC) 20 MG capsule Take 20 mg by mouth daily.   Yes [provider]  Oxcarbazepine (TRILEPTAL) 300 MG tablet Take 150 mg by mouth 2 (two) times daily. 02/22/19  Yes [provider]  potassium chloride (K-DUR) 10 MEQ tablet Take 2 tablets (20 mEq total) by mouth daily. 03/26/19 08/05/19 Yes Gouru, Aruna, MD  rosuvastatin (CRESTOR) 10 MG tablet Take 10 mg by mouth daily. 04/21/17  Yes [provider]  SYMBICORT 160-4.5 MCG/ACT inhaler Inhale 2 puffs into the lungs 2 (two) times daily. 02/28/19  Yes [provider]  albuterol (PROVENTIL HFA;VENTOLIN HFA) 108 (90 BASE) MCG/ACT inhaler Inhale 2 puffs into the lungs every 4 (four) hours as needed for wheezing or shortness of breath.    [provider]  albuterol (PROVENTIL) (2.5 MG/3ML) 0.083% nebulizer solution Take 3 mLs (2.5 mg total) by nebulization every 4 (four) hours as needed for wheezing or shortness of breath. 09/23/17   Alisa Graff, FNP  docusate sodium (COLACE) 100 MG capsule Take 1 capsule (100 mg total) by mouth 2 (two) times daily as needed for mild constipation. 03/26/19   Nicholes Mango, MD  meclizine (ANTIVERT) 25 MG tablet Take 1 tablet (25 mg total) by mouth 3 (three) times daily as needed for dizziness. 03/26/19   Nicholes Mango, MD    Physical Exam: Vitals:   08/05/19 0850 08/05/19 0900 08/05/19 0930  08/05/19 1013  BP:  (!) 158/70 (!) 142/67 (!) 152/64  Pulse: 60 63 (!) 58 60  Resp: (!) 23 (!) 33 (!) 28 (!) 31  Temp:      TempSrc:      SpO2:  93% 93% 97% 96%      Constitutional: Mild to moderate distress as evidenced by ongoing right upper extremity pain, appears stated age Eyes: PERRL, lids and conjunctivae normal ENMT: Mucous membranes are dry. Posterior pharynx clear of any exudate or lesions. Neck: normal, supple, no masses, no thyromegaly Respiratory: Anterior lung sounds are coarse to auscultation especially in left mid field.  2 L oxygen.  No tachypnea or increased work of breathing. Cardiovascular: Regular rate and rhythm, no murmurs / rubs / gallops. No extremity edema. 2+ pedal pulses. No carotid bruits.  Abdomen: no tenderness, no masses palpated. No hepatosplenomegaly. Bowel sounds positive.  Musculoskeletal: no clubbing / cyanosis. No joint deformity upper and lower extremities.  Significant constant pain involving RUE primarily at the upper portion and shoulder region.  Good ROM except for affected fracture limb, patient with notable complaints of knee pain with palpation-no effusion noted, no contractures. Normal muscle tone.  Skin: no rashes, lesions, ulcers. No induration -patient does have bruising of all the fingers of the left hand and of the left elbow, she also has bilateral knee abrasions greater on the right. Neurologic: CN 2-12 grossly intact. Sensation intact, DTR normal. Strength 5/5 x all 4 extremities.  Psychiatric: Appears to have normal judgment and insight but exam limited by inability to utilize sign Ecologist as stated above. Alert and oriented x 3.  Somewhat anxious mood secondary to ongoing pain.    Labs on Admission: I have personally reviewed following labs and imaging studies  CBC: Recent Labs  Lab 08/05/19 0817  WBC 7.6  NEUTROABS 4.0  HGB 15.3*  HCT 46.5*  MCV 82.3  PLT 284   Basic Metabolic Panel: Recent Labs  Lab  08/05/19 0817  NA 141  K 2.8*  CL 101  CO2 27  GLUCOSE 232*  BUN 11  CREATININE 0.53  CALCIUM 8.9  MG 1.5*   GFR: CrCl cannot be calculated (Unknown ideal weight.). Liver Function Tests: Recent Labs  Lab 08/05/19 0817  AST 25  ALT 14  ALKPHOS 132*  BILITOT 1.1  PROT 7.1  ALBUMIN 3.9   No results for input(s): LIPASE, AMYLASE in the last 168 hours. No results for input(s): AMMONIA in the last 168 hours. Coagulation Profile: No results for input(s): INR, PROTIME in the last 168 hours. Cardiac Enzymes: No results for input(s): CKTOTAL, CKMB, CKMBINDEX, TROPONINI in the last 168 hours. BNP (last 3 results) No results for input(s): PROBNP in the last 8760 hours. HbA1C: No results for input(s): HGBA1C in the last 72 hours. CBG: No results for input(s): GLUCAP in the last 168 hours. Lipid Profile: No results for input(s): CHOL, HDL, LDLCALC, TRIG, CHOLHDL, LDLDIRECT in the last 72 hours. Thyroid Function Tests: No results for input(s): TSH, T4TOTAL, FREET4, T3FREE, THYROIDAB in the last 72 hours. Anemia Panel: No results for input(s): VITAMINB12, FOLATE, FERRITIN, TIBC, IRON, RETICCTPCT in the last 72 hours. Urine analysis:    Component Value Date/Time   COLORURINE YELLOW (A) 08/14/2017 1622   APPEARANCEUR HAZY (A) 08/14/2017 1622   APPEARANCEUR Clear 11/14/2011 1607   LABSPEC >1.046 (H) 08/14/2017 1622   LABSPEC 1.006 11/14/2011 1607   PHURINE 6.0 08/14/2017 1622   GLUCOSEU NEGATIVE 08/14/2017 1622   GLUCOSEU Negative 11/14/2011 1607   HGBUR NEGATIVE 08/14/2017 1622   BILIRUBINUR NEGATIVE 08/14/2017 1622   BILIRUBINUR Negative 11/14/2011 1607   KETONESUR 5 (A) 08/14/2017 1622   PROTEINUR NEGATIVE 08/14/2017 1622   NITRITE NEGATIVE 08/14/2017 1622   LEUKOCYTESUR SMALL (A)  08/14/2017 1622   LEUKOCYTESUR Negative 11/14/2011 1607   Sepsis Labs: _0 (procalcitonin:4,lacticidven:4) )No results found for this or any previous visit (from the past 240 hour(s)).    Radiological Exams on Admission: DG Chest 1 View  Result Date: 08/05/2019 CLINICAL DATA:  Trauma secondary to a fall. Right arm pain. Head trauma. EXAM: CHEST  1 VIEW COMPARISON:  06/18/2019 FINDINGS: Heart size and pulmonary vascularity are normal and the lungs are clear. Aortic atherosclerosis. New comminuted impacted subluxed fracture of the right humeral head. No other significant bone abnormality. IMPRESSION: 1. No acute cardiopulmonary disease. 2. New comminuted impacted subluxed fracture of the right humeral head. Electronically Signed   By: Lorriane Shire M.D.   On: 08/05/2019 09:17   DG Pelvis 1-2 Views  Result Date: 08/05/2019 CLINICAL DATA:  Multiple trauma secondary to a fall. EXAM: PELVIS - 1-2 VIEW COMPARISON:  05/15/2012 FINDINGS: There is no evidence of pelvic fracture or diastasis. No pelvic bone lesions are seen. IMPRESSION: Normal exam. Electronically Signed   By: Lorriane Shire M.D.   On: 08/05/2019 09:21   CT Head Wo Contrast  Result Date: 08/05/2019 CLINICAL DATA:  Status post fall with a blow to the head. Initial encounter. EXAM: CT HEAD WITHOUT CONTRAST CT CERVICAL SPINE WITHOUT CONTRAST TECHNIQUE: Multidetector CT imaging of the head and cervical spine was performed following the standard protocol without intravenous contrast. Multiplanar CT image reconstructions of the cervical spine were also generated. COMPARISON:  Head and cervical spine CT scans 06/15/2017. FINDINGS: CT HEAD FINDINGS Brain: No evidence of acute infarction, hemorrhage, hydrocephalus, extra-axial collection or mass lesion/mass effect. Vascular: No hyperdense vessel or unexpected calcification. Skull: Intact.  No focal lesion. Sinuses/Orbits: Status post cataract surgery.  Otherwise negative. Other: None. CT CERVICAL SPINE FINDINGS Alignment: Maintained. Skull base and vertebrae: No acute fracture. No primary bone lesion or focal pathologic process. Soft tissues and spinal canal: No prevertebral fluid or  swelling. No visible canal hematoma. Disc levels: Loss of disc space height and mild endplate spurring are seen at C5-6. Upper chest: Lung apices clear. Other: None. IMPRESSION: No acute abnormality head or cervical spine. Degenerative disc disease C5-6. Electronically Signed   By: Inge Rise M.D.   On: 08/05/2019 08:50   CT Cervical Spine Wo Contrast  Result Date: 08/05/2019 CLINICAL DATA:  Status post fall with a blow to the head. Initial encounter. EXAM: CT HEAD WITHOUT CONTRAST CT CERVICAL SPINE WITHOUT CONTRAST TECHNIQUE: Multidetector CT imaging of the head and cervical spine was performed following the standard protocol without intravenous contrast. Multiplanar CT image reconstructions of the cervical spine were also generated. COMPARISON:  Head and cervical spine CT scans 06/15/2017. FINDINGS: CT HEAD FINDINGS Brain: No evidence of acute infarction, hemorrhage, hydrocephalus, extra-axial collection or mass lesion/mass effect. Vascular: No hyperdense vessel or unexpected calcification. Skull: Intact.  No focal lesion. Sinuses/Orbits: Status post cataract surgery.  Otherwise negative. Other: None. CT CERVICAL SPINE FINDINGS Alignment: Maintained. Skull base and vertebrae: No acute fracture. No primary bone lesion or focal pathologic process. Soft tissues and spinal canal: No prevertebral fluid or swelling. No visible canal hematoma. Disc levels: Loss of disc space height and mild endplate spurring are seen at C5-6. Upper chest: Lung apices clear. Other: None. IMPRESSION: No acute abnormality head or cervical spine. Degenerative disc disease C5-6. Electronically Signed   By: Inge Rise M.D.   On: 08/05/2019 08:50   DG Humerus Right  Result Date: 08/05/2019 CLINICAL DATA:  Right arm pain secondary to a fall. EXAM:  RIGHT HUMERUS - 2+ VIEW COMPARISON:  None FINDINGS: There is an impacted comminuted subluxed fracture of the right humeral head. The distal humerus is intact. IMPRESSION: Impacted  comminuted subluxed fracture of the right humeral head. Electronically Signed   By: Lorriane Shire M.D.   On: 08/05/2019 09:20   DG Foot Complete Left  Result Date: 08/05/2019 CLINICAL DATA:  Pain secondary to a fall. EXAM: LEFT FOOT - COMPLETE 3+ VIEW COMPARISON:  None. FINDINGS: There is no evidence of fracture or dislocation. There is no evidence of arthropathy or other focal bone abnormality. Soft tissues are unremarkable. IMPRESSION: Negative. Electronically Signed   By: Lorriane Shire M.D.   On: 08/05/2019 09:19    EKG: (Independently reviewed)  Sinus rhythm with ventricular rate 61 bpm, QTC 568 ms, slightly prolonged QRS interval, downsloping ST segments in the inferior lateral leads unchanged from previous EKG  Assessment/Plan Principal Problem:   Comminuted fracture of right humerus secondary to mechanical fall -Presented to the ER after falling outside her home and was found to have an impacted and comminuted subluxed fracture of the right humeral head -Formal orthopedic evaluation pending but anticipate operative intervention within the next 24 hours -We will go ahead and give a one-time dose of oral oxycodone to help with pain otherwise utilize IV morphine -PT/OT evaluation postoperatively-unclear if patient will be able to successfully manage at home with this injury  -Admission and preoperative SARS-CoV-2 AG/PCR testing ordered  Active Problems:   Acute hypokalemia and hypomagnesemia/prolonged QT interval -She is on Lasix and potassium supplements at home and presents with a potassium of 2.8 and magnesium level of 1.5 -Clinical exam consistent with dehydration -Give both oral and IV replacement of potassium as well as IV replacement of magnesium -QTC on EKG 568 so will need to use caution with administration of antiemetic medication and all of these medications can prolong QT interval    Seizure disorder ([place]) with witnessed seizure activity -Patient has documented history of  seizure disorder and apparently has had status epilepticus in the past requiring intubation -Had 3 witnessed focal seizures today involving the face and eyes without postictal phase-seizure activity aborted with Ativan -Formal neurology consultation pending recommendation is to load with Vimpat 200 mg followed by Vimpat 100 mg twice daily -Patient on Trileptal at home and according to September discharge summary she was also supposed to be taking Vimpat.  Unclear why patient is no longer on this medication.  Attending physician spoke with patient's son who does not have any knowledge of patient's home medications. -Seizure precaution -Telemetry monitoring-if has additional seizure activity may need to change to stepdown status  -EEG    Diabetes mellitus without complication ([place]) -Not on medications at home -Hemoglobin A1c 7.6 in September -Follow CBGs and provide SSI    Essential hypertension/ Chronic diastolic heart failure (H[facility]) -Home medications include Norvasc, Cardizem CD, Lasix and Cozaar -We will continue home medications but given dehydrated state we will hold Lasix for now -Daily weights with strict I's/    COPD (chronic obstructive pulmonary disease) ([place]) on chronic O2/ongoing tobacco abuse -Continue supplemental oxygen per home regimen-keep saturations greater than 92% therefore may need to titrate up if indicated -Continue preadmission albuterol nebs as well as therapeutic substitution for Symbicort HFA    Hearing impaired -Having difficulty communicating with patient due to inability to sign related to right upper extremity trauma -Patient cannot communicate by lip reading but this is also difficult given requirement to wear masks in context of  ongoing COVID-19 pandemic -Patient is able to communicate somewhat with gestures; in the past has communicated with the written word but again this is unable to be accomplished due to right upper extremity injury    Cirrhosis,  non-alcoholic ([place]) -LFTs within normal limits    History of CVA in adulthood -Continue preadmission statin  -We will hold Plavix and will resume at discretion of orthopedic surgery team    Chronic depression -Continue Celexa    **Additional lab, imaging and/or diagnostic evaluation at discretion of supervising physician  DVT prophylaxis: Subcu heparin with first dose delayed until 2 PM or until after surgery at discretion of orthopedic team Code Status: Full Family Communication: Patient son updated by attending physician by telephone Disposition Plan: Inpatient Consults called: Orthopedics/Dr. Mack Guise; neurology Dr.Zeylikman     Erin Hearing ANP-BC [facility] Pager [phone]   If 7PM-7AM, please contact night-coverage [email or web address]   08/05/2019, 10:49 AM

## 2019-08-05 NOTE — Consult Note (Signed)
ORTHOPAEDIC CONSULTATION  REQUESTING PHYSICIAN: Ivor Costa, MD  Chief Complaint: Right proximal humerus fracture status post fall  HPI: [name] is a 69 y.o. deaf female with COPD, CHF, cirrhosis and seizure disorder who fell today outside while smoking.  Patient is on Plavix at baseline.  She was diagnosed with a comminuted closed right proximal humerus fracture by x-ray in the emergency department.  Patient was evaluated by neurology for her seizure disorder.  Orthopedics is consulted for management of her right proximal humerus fracture.  It is difficult to obtain a history from the patient given that she is deaf.  I hand wrote messages to the patient on a clipboard in her room.  Past Medical History:  Diagnosis Date  . Asthma   . CHF (congestive heart failure) ([place])   . Cirrhosis, non-alcoholic ([place])   . COPD (chronic obstructive pulmonary disease) ([place])   . Deaf   . Depression   . Diabetes mellitus without complication ([place])   . GERD (gastroesophageal reflux disease)   . Heart murmur   . Hepatitis   . Hypertension   . Lymph node disorder    arm  . Neuropathy   . On home oxygen therapy    hs  . Orthopnea   . RLS (restless legs syndrome)   . Seizures ([place])   . Shortness of breath dyspnea   . Sleep apnea   . Stroke [facility])    tia   Past Surgical History:  Procedure Laterality Date  . CATARACT EXTRACTION W/PHACO Right 11/23/2014   Procedure: CATARACT EXTRACTION PHACO AND INTRAOCULAR LENS PLACEMENT (IOC);  Surgeon: Lyla Glassing, MD;  Location: [facility] ORS;  Service: Ophthalmology;  Laterality: Right;  . CATARACT EXTRACTION W/PHACO Left 12/14/2014   Procedure: CATARACT EXTRACTION PHACO AND INTRAOCULAR LENS PLACEMENT (IOC);  Surgeon: Lyla Glassing, MD;  Location: [facility] ORS;  Service: Ophthalmology;  Laterality: Left;  US:01:16.6 AP:15.8 CDE:12.14  . CESAREAN SECTION    . CHOLECYSTECTOMY    . KNEE ARTHROPLASTY    . THUMB ARTHROSCOPY    . TONSILLECTOMY    .  TYMPANOPLASTY     muliple   Social History   Socioeconomic History  . Marital status: Widowed    Spouse name: Not on file  . Number of children: Not on file  . Years of education: Not on file  . Highest education level: Not on file  Occupational History  . Not on file  Tobacco Use  . Smoking status: Current Every Day Smoker    Packs/day: 0.50    Years: 50.00    Pack years: 25.00    Types: Cigarettes    Start date: 03/01/1968  . Smokeless tobacco: Never Used  Substance and Sexual Activity  . Alcohol use: No  . Drug use: No  . Sexual activity: Not Currently  Other Topics Concern  . 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    Social Determinants of Health   Financial Resource Strain:   . Difficulty of Paying Living Expenses: Not on file  Food Insecurity:   . Worried About Charity fundraiser in the Last Year: Not on file  . Ran Out of Food in the Last Year: Not on file  Transportation Needs:   . Lack of  Transportation (Medical): Not on file  . Lack of Transportation (Non-Medical): Not on file  Physical Activity:   . Days of Exercise per Week: Not on file  . Minutes of Exercise per Session: Not on file  Stress:   . Feeling of Stress : Not on file  Social Connections:   . Frequency of Communication with Friends and Family: Not on file  . Frequency of Social Gatherings with Friends and Family: Not on file  . Attends Religious Services: Not on file  . Active Member of Clubs or Organizations: Not on file  . Attends Archivist Meetings: Not on file  . Marital Status: Not on file   Family History  Problem Relation Age of Onset  . Lung cancer Mother   . CAD Father    Allergies  Allergen Reactions  . Aspirin Itching  . Celebrex [Celecoxib] Itching    itching  .  Ciprofloxacin Itching  . Codeine Itching  . Fosphenytoin Itching  . Levaquin [Levofloxacin In D5w] Itching  . Levofloxacin Itching  . Lovastatin Itching  . Penicillins     Documentation indicates severe reaction  Pt tolerated cephalosporin without adverse reaction 09/18   . Pravastatin Itching  . Sulfa Antibiotics Itching   Prior to Admission medications   Medication Sig Start Date End Date Taking? Authorizing Provider  acidophilus (RISAQUAD) CAPS capsule Take 1 capsule by mouth daily. 06/27/17  Yes Gladstone Lighter, MD  amLODipine (NORVASC) 10 MG tablet Take 10 mg by mouth daily. 05/04/19  Yes [provider]  citalopram (CELEXA) 10 MG tablet Take 10 mg by mouth daily. 03/02/19  Yes [provider]  clopidogrel (PLAVIX) 75 MG tablet Take 1 tablet (75 mg total) by mouth daily. 07/17/16  Yes Vaughan Basta, MD  diltiazem (CARDIZEM CD) 180 MG 24 hr capsule Take 180 mg by mouth daily. 03/29/19  Yes [provider]  furosemide (LASIX) 40 MG tablet Take 40 mg by mouth daily. 07/22/18  Yes [provider]  losartan (COZAAR) 25 MG tablet Take 25 mg by mouth daily. 03/02/19  Yes [provider]  montelukast (SINGULAIR) 10 MG tablet Take 1 tablet by mouth at bedtime. 03/18/16  Yes [provider]  omeprazole (PRILOSEC) 20 MG capsule Take 20 mg by mouth daily.   Yes [provider]  Oxcarbazepine (TRILEPTAL) 300 MG tablet Take 150 mg by mouth 2 (two) times daily. 02/22/19  Yes [provider]  potassium chloride (K-DUR) 10 MEQ tablet Take 2 tablets (20 mEq total) by mouth daily. 03/26/19 08/05/19 Yes Gouru, Aruna, MD  rosuvastatin (CRESTOR) 10 MG tablet Take 10 mg by mouth daily. 04/21/17  Yes [provider]  SYMBICORT 160-4.5 MCG/ACT inhaler Inhale 2 puffs into the lungs 2 (two) times daily. 02/28/19  Yes [provider]  albuterol (PROVENTIL HFA;VENTOLIN HFA) 108 (90 BASE) MCG/ACT inhaler Inhale 2 puffs  into the lungs every 4 (four) hours as needed for wheezing or shortness of breath.    [provider]  albuterol (PROVENTIL) (2.5 MG/3ML) 0.083% nebulizer solution Take 3 mLs (2.5 mg total) by nebulization every 4 (four) hours as needed for wheezing or shortness of breath. 09/23/17   Alisa Graff, FNP  docusate sodium (COLACE) 100 MG capsule Take 1 capsule (100 mg total) by mouth 2 (two) times daily as needed for mild constipation. 03/26/19   Nicholes Mango, MD  meclizine (ANTIVERT) 25 MG tablet Take 1 tablet (25 mg total) by mouth 3 (three) times daily as needed for  dizziness. 03/26/19   Nicholes Mango, MD   EEG  Result Date: 08/05/2019 Lora Havens, MD     08/05/2019  1:52 PM Patient Name: ARYNN ARMAND MRN: [id number] Epilepsy Attending: Lora Havens Referring Physician/Provider: Dr Leotis Pain Date: 08/05/2019 Duration: 26.07 minutes Patient history: 69 year old female with history of seizure-like episodes (also concern for nonepileptic events) presented with fall.  On exam was noted to have right upper extremity shaking movements briefly with eye movement fluctuations.  EEG to evaluate for seizures. Level of alertness: Awake, asleep AEDs during EEG study: Oxcarbazepine, Vimpat Technical aspects: This EEG study was done with scalp electrodes positioned according to the 10-20 International system of electrode placement. Electrical activity was acquired at a sampling rate of _0  and reviewed with a high frequency filter of _1  and a low frequency filter of _2 . EEG data were recorded continuously and digitally stored. Description: The posterior dominant rhythm consists of 8 Hz activity of moderate voltage (25-35 uV) seen predominantly in posterior head regions, symmetric and reactive to eye opening and eye closing.  Sleep was characterized by sleep spindles (12 to 14 Hz), maximal frontocentral.  Physiologic photic driving was not seen during photic stimulation.  Hyperventilation was not  performed.       IMPRESSION: This study is within normal limits. No seizures or epileptiform discharges were seen throughout the recording. Lora Havens   DG Chest 1 View  Result Date: 08/05/2019 CLINICAL DATA:  Trauma secondary to a fall. Right arm pain. Head trauma. EXAM: CHEST  1 VIEW COMPARISON:  06/18/2019 FINDINGS: Heart size and pulmonary vascularity are normal and the lungs are clear. Aortic atherosclerosis. New comminuted impacted subluxed fracture of the right humeral head. No other significant bone abnormality. IMPRESSION: 1. No acute cardiopulmonary disease. 2. New comminuted impacted subluxed fracture of the right humeral head. Electronically Signed   By: Lorriane Shire M.D.   On: 08/05/2019 09:17   DG Pelvis 1-2 Views  Result Date: 08/05/2019 CLINICAL DATA:  Multiple trauma secondary to a fall. EXAM: PELVIS - 1-2 VIEW COMPARISON:  05/15/2012 FINDINGS: There is no evidence of pelvic fracture or diastasis. No pelvic bone lesions are seen. IMPRESSION: Normal exam. Electronically Signed   By: Lorriane Shire M.D.   On: 08/05/2019 09:21   CT Head Wo Contrast  Result Date: 08/05/2019 CLINICAL DATA:  Status post fall with a blow to the head. Initial encounter. EXAM: CT HEAD WITHOUT CONTRAST CT CERVICAL SPINE WITHOUT CONTRAST TECHNIQUE: Multidetector CT imaging of the head and cervical spine was performed following the standard protocol without intravenous contrast. Multiplanar CT image reconstructions of the cervical spine were also generated. COMPARISON:  Head and cervical spine CT scans 06/15/2017. FINDINGS: CT HEAD FINDINGS Brain: No evidence of acute infarction, hemorrhage, hydrocephalus, extra-axial collection or mass lesion/mass effect. Vascular: No hyperdense vessel or unexpected calcification. Skull: Intact.  No focal lesion. Sinuses/Orbits: Status post cataract surgery.  Otherwise negative. Other: None. CT CERVICAL SPINE FINDINGS Alignment: Maintained. Skull base and vertebrae: No  acute fracture. No primary bone lesion or focal pathologic process. Soft tissues and spinal canal: No prevertebral fluid or swelling. No visible canal hematoma. Disc levels: Loss of disc space height and mild endplate spurring are seen at C5-6. Upper chest: Lung apices clear. Other: None. IMPRESSION: No acute abnormality head or cervical spine. Degenerative disc disease C5-6. Electronically Signed   By: Inge Rise M.D.   On: 08/05/2019 08:50   CT Cervical Spine Wo Contrast  Result Date: 08/05/2019 CLINICAL  DATA:  Status post fall with a blow to the head. Initial encounter. EXAM: CT HEAD WITHOUT CONTRAST CT CERVICAL SPINE WITHOUT CONTRAST TECHNIQUE: Multidetector CT imaging of the head and cervical spine was performed following the standard protocol without intravenous contrast. Multiplanar CT image reconstructions of the cervical spine were also generated. COMPARISON:  Head and cervical spine CT scans 06/15/2017. FINDINGS: CT HEAD FINDINGS Brain: No evidence of acute infarction, hemorrhage, hydrocephalus, extra-axial collection or mass lesion/mass effect. Vascular: No hyperdense vessel or unexpected calcification. Skull: Intact.  No focal lesion. Sinuses/Orbits: Status post cataract surgery.  Otherwise negative. Other: None. CT CERVICAL SPINE FINDINGS Alignment: Maintained. Skull base and vertebrae: No acute fracture. No primary bone lesion or focal pathologic process. Soft tissues and spinal canal: No prevertebral fluid or swelling. No visible canal hematoma. Disc levels: Loss of disc space height and mild endplate spurring are seen at C5-6. Upper chest: Lung apices clear. Other: None. IMPRESSION: No acute abnormality head or cervical spine. Degenerative disc disease C5-6. Electronically Signed   By: Inge Rise M.D.   On: 08/05/2019 08:50   CT SHOULDER RIGHT WO CONTRAST  Result Date: 08/05/2019 CLINICAL DATA:  [place]. Evaluate right humeral head and neck fractures. EXAM: CT OF THE UPPER RIGHT  EXTREMITY WITHOUT CONTRAST TECHNIQUE: Multidetector CT imaging of the upper right extremity was performed according to the standard protocol. COMPARISON:  Radiographs 08/05/2019 FINDINGS: Complex comminuted fractures of the humeral head and neck are noted. There is a comminuted and impacted transverse fracture through the humeral neck and comminuted and displaced fractures of the greater and lesser tuberosities. The humeral head is normally located in the glenoid fossa. Moderate shortening is noted with increased humeroacromial distance. The [facility] joint is intact. Os acromial noted incidentally. No clavicle or scapular fracture. The visualized right ribs are intact. Grossly by CT the rotator cuff tendons are intact. The visualized lungs are clear. IMPRESSION: 1. Complex comminuted fractures of the humeral head and neck as described above. 2. Grossly intact rotator cuff tendons. 3. Intact AC joint.  Incidental os acromial. Electronically Signed   By: Marijo Sanes M.D.   On: 08/05/2019 14:19   DG Knee Complete 4 Views Right  Result Date: 08/05/2019 CLINICAL DATA:  Mechanical fall, no loss consciousness EXAM: RIGHT KNEE - COMPLETE 4+ VIEW COMPARISON:  None. FINDINGS: No evidence of fracture, dislocation, or joint effusion. No evidence of arthropathy or other focal bone abnormality. Soft tissues are unremarkable. IMPRESSION: No acute osseous injury of the right knee. Electronically Signed   By: Kathreen Devoid   On: 08/05/2019 11:06   DG Humerus Right  Result Date: 08/05/2019 CLINICAL DATA:  Right arm pain secondary to a fall. EXAM: RIGHT HUMERUS - 2+ VIEW COMPARISON:  None FINDINGS: There is an impacted comminuted subluxed fracture of the right humeral head. The distal humerus is intact. IMPRESSION: Impacted comminuted subluxed fracture of the right humeral head. Electronically Signed   By: Lorriane Shire M.D.   On: 08/05/2019 09:20   DG Foot Complete Left  Result Date: 08/05/2019 CLINICAL DATA:  Pain secondary  to a fall. EXAM: LEFT FOOT - COMPLETE 3+ VIEW COMPARISON:  None. FINDINGS: There is no evidence of fracture or dislocation. There is no evidence of arthropathy or other focal bone abnormality. Soft tissues are unremarkable. IMPRESSION: Negative. Electronically Signed   By: Lorriane Shire M.D.   On: 08/05/2019 09:19    Positive ROS: All other systems have been reviewed and were otherwise negative with the exception of those  mentioned in the HPI and as above.  Physical Exam: General: Alert, no acute distress  MUSCULOSKELETAL: Right upper extremity is in a shoulder immobilizer.  Patient has intact skin.  There is no significant swelling or ecchymosis seen.  Her forearm and arm compartments are soft and compressible.  She can flex and extend all fingers of the right hand.  Her fingers are well-perfused and she has a palpable radial pulse.  Assessment: Right closed, comminuted proximal humerus fracture  Plan: I have reviewed the patient's x-rays and CT scan.  She has a comminuted fracture involving the proximal humerus.  The glenohumeral joint is located.  There appears to be displacement of the greater and lesser tuberosities.  A transverse fracture through the surgical neck is impacted and minimally displaced.  There is no significant angulation.  I would recommend at this point treatment with a shoulder immobilizer.  Patient would benefit from physical therapy evaluation to determine if she could be discharged home with services or whether she will require a skilled nursing facility.  Patient will follow up in our office in approximately 1 week following discharge.  Follow-up x-rays will be taken and used to determine whether surgical intervention will be needed.  If the patient does require surgery for this fracture is most likely she will require a right shoulder hemiarthroplasty versus arthroplasty.  I have spoken with the patient's son, Herbie [place], and have explained to him the nature of the  patient's fracture and the above-noted plan.  He understands and agrees with the plan.  The patient's nurse states that the sign language interpreter will be in the patient's room tomorrow at 11 AM.  I am going to try and be present with the interpreter to answer any questions the patient may have.    Thornton Park, MD    08/05/2019 6:12 PM

## 2019-08-05 NOTE — ED Notes (Signed)
Taken to CT with RN, monitor and ambu

## 2019-08-05 NOTE — Progress Notes (Signed)
MEWS Guidelines - (patients age 68 and over)  HR remains in 1teens-120s, MEWS protocol initiated. Pt asymptomatic,resting in bed. NP Sharion Settler notified.Will continue to monitor per MEWS protocol.  Yellow - At risk for Deterioration  1. Go to room and assess patient 2. Validate data. Is this patient's baseline? If data confirmed: 3. Is this an acute change? 4. Administer prn meds/treatments as ordered? 5. Note Sepsis score 6. Review goals of care 7. Sports coach and Provider 8. Call RRT nurse as needed. 9. Document patient condition/interventions/response. 10. Increase frequency of vital signs and focused assessments to at least q2h x2. - If stable, then q4h x2 and then q8h or dept. routine. - If unstable, contact Provider & RRT nurse. Prepare for possible transfer. 11. Add entry in progress notes using the smart phrase ".MEWS".

## 2019-08-05 NOTE — ED Notes (Signed)
Xray at bedside

## 2019-08-05 NOTE — Progress Notes (Signed)
AM PO meds not given once pt arrived to unit  from ED. Pt was lethargic.  MD made aware - no new orders at this time.

## 2019-08-05 NOTE — ED Notes (Signed)
Called pharmacy requesting vimpat

## 2019-08-05 NOTE — Consult Note (Addendum)
Reason for Consult:seizure Requesting Physician: Dr. Blaine Hamper  CC: seizure  HPI: [name] is an 69 y.o. female past medical history of hypertension, deaf,  hyperlipidemia, diabetes mellitus, COPD on 2 L nasal cannula oxygen at home, smoker, asthma, stroke, GERD, depression, seizure, nonalcoholic cirrhosis, dCHF,who presents with fall, found to have closed comminuted fracture of right humerus and s/p fall.   Patient does have hx of seizures and was on multiple anti epileptics in the past.  It was titrated down to just trileptal and at 150m BID. Unclear if patient was compliant with. Presents with suspected multiple brief seizure events without clear post ictal state. Patient does shake her RUE briefly with eye movement fluctuations.  Given vimpat 200 now and started 100 BID while in hospital.     Past Medical History:  Diagnosis Date  . Asthma   . CHF (congestive heart failure) ([facility]   . Cirrhosis, non-alcoholic ([facility]   . COPD (chronic obstructive pulmonary disease) ([facility]   . Deaf   . Depression   . Diabetes mellitus without complication ([facility]   . GERD (gastroesophageal reflux disease)   . Heart murmur   . Hepatitis   . Hypertension   . Lymph node disorder    arm  . Neuropathy   . On home oxygen therapy    hs  . Orthopnea   . RLS (restless legs syndrome)   . Seizures ([place]   . Shortness of breath dyspnea   . Sleep apnea   . Stroke ([facility]    tia    Past Surgical History:  Procedure Laterality Date  . CATARACT EXTRACTION W/PHACO Right 11/23/2014   Procedure: CATARACT EXTRACTION PHACO AND INTRAOCULAR LENS PLACEMENT (IOC);  Surgeon: NLyla Glassing MD;  Location: [facility] ORS;  Service: Ophthalmology;  Laterality: Right;  . CATARACT EXTRACTION W/PHACO Left 12/14/2014   Procedure: CATARACT EXTRACTION PHACO AND INTRAOCULAR LENS PLACEMENT (IOC);  Surgeon: NLyla Glassing MD;  Location: [facility] ORS;  Service: Ophthalmology;  Laterality: Left;  US:01:16.6 AP:15.8 CDE:12.14  . CESAREAN  SECTION    . CHOLECYSTECTOMY    . KNEE ARTHROPLASTY    . THUMB ARTHROSCOPY    . TONSILLECTOMY    . TYMPANOPLASTY     muliple    Family History  Problem Relation Age of Onset  . Lung cancer Mother   . CAD Father     Social History:  reports that she has been smoking cigarettes. She started smoking about 51 years ago. She has a 25.00 pack-year smoking history. She has never used smokeless tobacco. She reports that she does not drink alcohol or use drugs.  Allergies  Allergen Reactions  . Aspirin Itching  . Celebrex [Celecoxib] Itching    itching  . Ciprofloxacin Itching  . Codeine Itching  . Fosphenytoin Itching  . Levaquin [Levofloxacin In D5w] Itching  . Levofloxacin Itching  . Lovastatin Itching  . Penicillins     Documentation indicates severe reaction  Pt tolerated cephalosporin without adverse reaction 09/18   . Pravastatin Itching  . Sulfa Antibiotics Itching    Medications: I have reviewed the patient's current medications.  ROS: Unable to obtain as deaf and R humorous fracture, difficulty to participate.   Physical Examination: Blood pressure (!) 152/64, pulse 60, temperature (!) 97.5 F (36.4 C), temperature source Oral, resp. rate (!) 31, SpO2 96 %.  Pt is awake, oepens eyes Follows minimal commands EOM intact R arm pain  Moves lower extremities   Appears sedated post ativan   Laboratory Studies:  Basic Metabolic Panel: Recent Labs  Lab 08/05/19 0817  NA 141  K 2.8*  CL 101  CO2 27  GLUCOSE 232*  BUN 11  CREATININE 0.53  CALCIUM 8.9  MG 1.5*    Liver Function Tests: Recent Labs  Lab 08/05/19 0817  AST 25  ALT 14  ALKPHOS 132*  BILITOT 1.1  PROT 7.1  ALBUMIN 3.9   No results for input(s): LIPASE, AMYLASE in the last 168 hours. No results for input(s): AMMONIA in the last 168 hours.  CBC: Recent Labs  Lab 08/05/19 0817  WBC 7.6  NEUTROABS 4.0  HGB 15.3*  HCT 46.5*  MCV 82.3  PLT 225    Cardiac Enzymes: No results  for input(s): CKTOTAL, CKMB, CKMBINDEX, TROPONINI in the last 168 hours.  BNP: Invalid input(s): POCBNP  CBG: No results for input(s): GLUCAP in the last 168 hours.  Microbiology: Results for orders placed or performed during the hospital encounter of 08/05/19  Respiratory Panel by RT PCR (Flu A&B, Covid) - Nasopharyngeal Swab     Status: None   Collection Time: 08/05/19 10:07 AM   Specimen: Nasopharyngeal Swab  Result Value Ref Range Status   SARS Coronavirus 2 by RT PCR NEGATIVE NEGATIVE Final    Comment: (NOTE) SARS-CoV-2 target nucleic acids are NOT DETECTED. The SARS-CoV-2 RNA is generally detectable in upper respiratoy specimens during the acute phase of infection. The lowest concentration of SARS-CoV-2 viral copies this assay can detect is 131 copies/mL. A negative result does not preclude SARS-Cov-2 infection and should not be used as the sole basis for treatment or other patient management decisions. A negative result may occur with  improper specimen collection/handling, submission of specimen other than nasopharyngeal swab, presence of viral mutation(s) within the areas targeted by this assay, and inadequate number of viral copies (<131 copies/mL). A negative result must be combined with clinical observations, patient history, and epidemiological information. The expected result is Negative. Fact Sheet for Patients:  PinkCheek.be Fact Sheet for Healthcare Providers:  GravelBags.it This test is not yet ap proved or cleared by the [place] FDA and  has been authorized for detection and/or diagnosis of SARS-CoV-2 by FDA under an Emergency Use Authorization (EUA). This EUA will remain  in effect (meaning this test can be used) for the duration of the COVID-19 declaration under Section 564(b)(1) of the Act, 21 U.S.C. section 360bbb-3(b)(1), unless the authorization is terminated or revoked sooner.     Influenza A by PCR NEGATIVE NEGATIVE Final   Influenza B by PCR NEGATIVE NEGATIVE Final    Comment: (NOTE) The Xpert Xpress SARS-CoV-2/FLU/RSV assay is intended as an aid in  the diagnosis of influenza from Nasopharyngeal swab specimens and  should not be used as a sole basis for treatment. Nasal washings and  aspirates are unacceptable for Xpert Xpress SARS-CoV-2/FLU/RSV  testing. Fact Sheet for Patients: PinkCheek.be Fact Sheet for Healthcare Providers: GravelBags.it This test is not yet approved or cleared by the [place] FDA and  has been authorized for detection and/or diagnosis of SARS-CoV-2 by  FDA under an Emergency Use Authorization (EUA). This EUA will remain  in effect (meaning this test can be used) for the duration of the  Covid-19 declaration under Section 564(b)(1) of the Act, 21  U.S.C. section 360bbb-3(b)(1), unless the authorization is  terminated or revoked. Performed at [facility], [address] [place], [place] [address]     Coagulation Studies: No results for input(s): LABPROT, INR in the last 72 hours.  Urinalysis: No results for input(s): COLORURINE, LABSPEC, PHURINE, GLUCOSEU, HGBUR, BILIRUBINUR, KETONESUR, PROTEINUR, UROBILINOGEN, NITRITE, LEUKOCYTESUR in the last 168 hours.  Invalid input(s): APPERANCEUR  Lipid Panel:     Component Value Date/Time   TRIG 133 04/29/2017 1523    HgbA1C:  Lab Results  Component Value Date   HGBA1C 7.6 (H) 03/24/2019    Urine Drug Screen:      Component Value Date/Time   LABOPIA NONE DETECTED 04/29/2017 1312   COCAINSCRNUR NONE DETECTED 04/29/2017 1312   LABBENZ NONE DETECTED 04/29/2017 1312   AMPHETMU NONE DETECTED 04/29/2017 1312   THCU NONE DETECTED 04/29/2017 1312   LABBARB NONE DETECTED 04/29/2017 1312    Alcohol Level: No results for input(s): ETH in the last 168 hours.  Other results: EKG: normal EKG, normal sinus rhythm,  unchanged from previous tracings.  Imaging: DG Chest 1 View  Result Date: 08/05/2019 CLINICAL DATA:  Trauma secondary to a fall. Right arm pain. Head trauma. EXAM: CHEST  1 VIEW COMPARISON:  06/18/2019 FINDINGS: Heart size and pulmonary vascularity are normal and the lungs are clear. Aortic atherosclerosis. New comminuted impacted subluxed fracture of the right humeral head. No other significant bone abnormality. IMPRESSION: 1. No acute cardiopulmonary disease. 2. New comminuted impacted subluxed fracture of the right humeral head. Electronically Signed   By: Lorriane Shire M.D.   On: 08/05/2019 09:17   DG Pelvis 1-2 Views  Result Date: 08/05/2019 CLINICAL DATA:  Multiple trauma secondary to a fall. EXAM: PELVIS - 1-2 VIEW COMPARISON:  05/15/2012 FINDINGS: There is no evidence of pelvic fracture or diastasis. No pelvic bone lesions are seen. IMPRESSION: Normal exam. Electronically Signed   By: Lorriane Shire M.D.   On: 08/05/2019 09:21   CT Head Wo Contrast  Result Date: 08/05/2019 CLINICAL DATA:  Status post fall with a blow to the head. Initial encounter. EXAM: CT HEAD WITHOUT CONTRAST CT CERVICAL SPINE WITHOUT CONTRAST TECHNIQUE: Multidetector CT imaging of the head and cervical spine was performed following the standard protocol without intravenous contrast. Multiplanar CT image reconstructions of the cervical spine were also generated. COMPARISON:  Head and cervical spine CT scans 06/15/2017. FINDINGS: CT HEAD FINDINGS Brain: No evidence of acute infarction, hemorrhage, hydrocephalus, extra-axial collection or mass lesion/mass effect. Vascular: No hyperdense vessel or unexpected calcification. Skull: Intact.  No focal lesion. Sinuses/Orbits: Status post cataract surgery.  Otherwise negative. Other: None. CT CERVICAL SPINE FINDINGS Alignment: Maintained. Skull base and vertebrae: No acute fracture. No primary bone lesion or focal pathologic process. Soft tissues and spinal canal: No prevertebral  fluid or swelling. No visible canal hematoma. Disc levels: Loss of disc space height and mild endplate spurring are seen at C5-6. Upper chest: Lung apices clear. Other: None. IMPRESSION: No acute abnormality head or cervical spine. Degenerative disc disease C5-6. Electronically Signed   By: Inge Rise M.D.   On: 08/05/2019 08:50   CT Cervical Spine Wo Contrast  Result Date: 08/05/2019 CLINICAL DATA:  Status post fall with a blow to the head. Initial encounter. EXAM: CT HEAD WITHOUT CONTRAST CT CERVICAL SPINE WITHOUT CONTRAST TECHNIQUE: Multidetector CT imaging of the head and cervical spine was performed following the standard protocol without intravenous contrast. Multiplanar CT image reconstructions of the cervical spine were also generated. COMPARISON:  Head and cervical spine CT scans 06/15/2017. FINDINGS: CT HEAD FINDINGS Brain: No evidence of acute infarction, hemorrhage, hydrocephalus, extra-axial collection or mass lesion/mass effect. Vascular: No hyperdense vessel or unexpected calcification. Skull: Intact.  No focal lesion. Sinuses/Orbits: Status  post cataract surgery.  Otherwise negative. Other: None. CT CERVICAL SPINE FINDINGS Alignment: Maintained. Skull base and vertebrae: No acute fracture. No primary bone lesion or focal pathologic process. Soft tissues and spinal canal: No prevertebral fluid or swelling. No visible canal hematoma. Disc levels: Loss of disc space height and mild endplate spurring are seen at C5-6. Upper chest: Lung apices clear. Other: None. IMPRESSION: No acute abnormality head or cervical spine. Degenerative disc disease C5-6. Electronically Signed   By: Inge Rise M.D.   On: 08/05/2019 08:50   DG Knee Complete 4 Views Right  Result Date: 08/05/2019 CLINICAL DATA:  Mechanical fall, no loss consciousness EXAM: RIGHT KNEE - COMPLETE 4+ VIEW COMPARISON:  None. FINDINGS: No evidence of fracture, dislocation, or joint effusion. No evidence of arthropathy or other  focal bone abnormality. Soft tissues are unremarkable. IMPRESSION: No acute osseous injury of the right knee. Electronically Signed   By: Kathreen Devoid   On: 08/05/2019 11:06   DG Humerus Right  Result Date: 08/05/2019 CLINICAL DATA:  Right arm pain secondary to a fall. EXAM: RIGHT HUMERUS - 2+ VIEW COMPARISON:  None FINDINGS: There is an impacted comminuted subluxed fracture of the right humeral head. The distal humerus is intact. IMPRESSION: Impacted comminuted subluxed fracture of the right humeral head. Electronically Signed   By: Lorriane Shire M.D.   On: 08/05/2019 09:20   DG Foot Complete Left  Result Date: 08/05/2019 CLINICAL DATA:  Pain secondary to a fall. EXAM: LEFT FOOT - COMPLETE 3+ VIEW COMPARISON:  None. FINDINGS: There is no evidence of fracture or dislocation. There is no evidence of arthropathy or other focal bone abnormality. Soft tissues are unremarkable. IMPRESSION: Negative. Electronically Signed   By: Lorriane Shire M.D.   On: 08/05/2019 09:19     Assessment/Plan:  69 y.o. female past medical history of hypertension, deaf,  hyperlipidemia, diabetes mellitus, COPD on 2 L nasal cannula oxygen at home, smoker, asthma, stroke, GERD, depression, seizure, nonalcoholic cirrhosis, dCHF,who presents with fall, found to have closed comminuted fracture of right humerus and s/p fall.   Patient does have hx of seizures and was on multiple anti epileptics in the past.  It was titrated down to just trileptal and at 145m BID. Unclear if patient was compliant with. Presents with suspected multiple brief seizure events without clear post ictal state. Patient does shake her RUE briefly with eye movement fluctuations.  Given vimpat 200 now and started 100 BID while in hospital.    - I have reviewed her significant past medical history regarding seizures going [facility]  Patient is s/p multiple EEGs, MRI without clear diagnosis of seizure - there was a  suspicion of non epileptic seizures in the past and that is the reason for titrating down AED.  - Would con't Vimpat right now at 100 BID along with trileptal as she did well with those while Keppra caused sedation - Will obtain another EEG - [facility]  - Orthopedic evaluation  - From Neurological stand point she needs to follow up with Dr. SManuella Gh[facility]who she saw in 2017 [facility]  Addendum: Normal awake EEG.  Will cut down Vimpat to 50 BID Possible d/c planning tomorrow from Nerological stand point 08/05/2019, 11:56 AM

## 2019-08-05 NOTE — ED Notes (Signed)
HR back in 120s and RR back at 40 similar presentation to previous seizure like activity.  Dr Darl Householder notified.  1 mg ativan given while pt in CT scan.

## 2019-08-06 DIAGNOSIS — S42351A Displaced comminuted fracture of shaft of humerus, right arm, initial encounter for closed fracture: Secondary | ICD-10-CM

## 2019-08-06 LAB — COMPREHENSIVE METABOLIC PANEL
ALT: 11 U/L (ref 0–44)
AST: 17 U/L (ref 15–41)
Albumin: 3.2 g/dL — ABNORMAL LOW (ref 3.5–5.0)
Alkaline Phosphatase: 107 U/L (ref 38–126)
Anion gap: 9 (ref 5–15)
BUN: 10 mg/dL (ref 8–23)
CO2: 28 mmol/L (ref 22–32)
Calcium: 8.3 mg/dL — ABNORMAL LOW (ref 8.9–10.3)
Chloride: 104 mmol/L (ref 98–111)
Creatinine, Ser: 0.35 mg/dL — ABNORMAL LOW (ref 0.44–1.00)
GFR calc Af Amer: >60 mL/min (ref >60)
GFR calc non Af Amer: >60 mL/min (ref >60)
Glucose, Bld: 187 mg/dL — ABNORMAL HIGH (ref 70–99)
Potassium: 3.1 mmol/L — ABNORMAL LOW (ref 3.5–5.1)
Sodium: 141 mmol/L (ref 135–145)
Total Bilirubin: 1 mg/dL (ref 0.3–1.2)
Total Protein: 6 g/dL — ABNORMAL LOW (ref 6.5–8.1)

## 2019-08-06 LAB — CBC
HCT: 36.2 % (ref 36.0–46.0)
Hemoglobin: 12.1 g/dL (ref 12.0–15.0)
MCH: 27.7 pg (ref 26.0–34.0)
MCHC: 33.4 g/dL (ref 30.0–36.0)
MCV: 82.8 fL (ref 80.0–100.0)
Platelets: 173 10*3/uL (ref 150–400)
RBC: 4.37 MIL/uL (ref 3.87–5.11)
RDW: 12.7 % (ref 11.5–15.5)
WBC: 7 10*3/uL (ref 4.0–10.5)
nRBC: 0 % (ref 0.0–0.2)

## 2019-08-06 LAB — URINALYSIS, COMPLETE (UACMP) WITH MICROSCOPIC
Bilirubin Urine: NEGATIVE
Glucose, UA: 50 mg/dL — AB
Hgb urine dipstick: NEGATIVE
Ketones, ur: NEGATIVE mg/dL
Leukocytes,Ua: NEGATIVE
Nitrite: NEGATIVE
Protein, ur: 30 mg/dL — AB
Specific Gravity, Urine: 1.027 (ref 1.005–1.030)
pH: 5 (ref 5.0–8.0)

## 2019-08-06 LAB — GLUCOSE, CAPILLARY
Glucose-Capillary: 179 mg/dL — ABNORMAL HIGH (ref 70–99)
Glucose-Capillary: 188 mg/dL — ABNORMAL HIGH (ref 70–99)
Glucose-Capillary: 163 mg/dL — ABNORMAL HIGH (ref 70–99)
Glucose-Capillary: 189 mg/dL — ABNORMAL HIGH (ref 70–99)

## 2019-08-06 LAB — MAGNESIUM: Magnesium: 1.9 mg/dL (ref 1.7–2.4)

## 2019-08-06 MED ORDER — METOPROLOL TARTRATE 5 MG/5ML IV SOLN: 2.5000 mg | INTRAVENOUS | Status: DC | PRN

## 2019-08-06 MED ORDER — DILTIAZEM HCL ER 90 MG PO CP12
90.0000 mg | ORAL_CAPSULE | Freq: Once | ORAL | Status: AC
  Administered 2019-08-06: 90 mg via ORAL
  Filled 2019-08-06: qty 1

## 2019-08-06 MED ORDER — POTASSIUM CHLORIDE CRYS ER 20 MEQ PO TBCR
60.0000 meq | EXTENDED_RELEASE_TABLET | Freq: Once | ORAL | Status: AC
  Administered 2019-08-06: 60 meq via ORAL
  Filled 2019-08-06: qty 3

## 2019-08-06 MED ORDER — LORAZEPAM 2 MG/ML IJ SOLN: 2.0000 mg | INTRAMUSCULAR | Status: DC | PRN

## 2019-08-06 MED ORDER — METOPROLOL TARTRATE 5 MG/5ML IV SOLN
5.0000 mg | Freq: Once | INTRAVENOUS | Status: AC
  Administered 2019-08-06: 5 mg via INTRAVENOUS
  Filled 2019-08-06: qty 5

## 2019-08-06 MED ORDER — MAGNESIUM SULFATE 2 GM/50ML IV SOLN
2.0000 g | Freq: Once | INTRAVENOUS | Status: AC
  Administered 2019-08-06: 09:00:00 2 g via INTRAVENOUS
  Filled 2019-08-06: qty 50

## 2019-08-06 MED ORDER — METOPROLOL TARTRATE 5 MG/5ML IV SOLN
2.5000 mg | Freq: Four times a day (QID) | INTRAVENOUS | Status: DC | PRN
  Administered 2019-08-06: 2.5 mg via INTRAVENOUS
  Filled 2019-08-06: qty 5

## 2019-08-06 NOTE — Evaluation (Signed)
Occupational Therapy Evaluation Patient Details Name: [name] MRN: [id number] DOB: 01-Mar-1951 Today's Date: 08/06/2019    History of Present Illness 69 yo Female presents to hospital after sustaining a fall outside her home. CT scan of RUE shows comminuted closed humeral head fracture. Ortho consult recommended RUE immobilizer and will re-assess in a week to determine if surgery is needed. She will need to wear immobilizer at all times unless bathing/dressing, elbow ROM is permitted; Neuro consulted for history of seizures. Medication updated and recommend patient follow up with outpatient neurology. PMH significant HTN,HLD, DM COPD on 2L O2 at home, Asthma, past CVA, GERD, depression ,seizures, non-alcohol cirrhosis, CHF, smoker; Patient is deaf and will require an [place] interpreter.   Clinical Impression   Pt was seen for OT/PT evaluation this date. ASL Interpreter utilized. Prior to hospital admission, pt was generally independent with ADL, using bubble packs for medication, and son would assist with cooking/cleaning. Pt lives with son and son's girlfriend. Not using AD for mobility at baseline, 1-2 falls in last few months. Currently pt demonstrates impairments as described below (See OT problem list) which significantly functionally limit her ability to perform ADL/self-care tasks. Pt currently requires Mod A +2 for bed mobility with pain being a limiting factor. PT sat EOB for approx 5-10 min. Shoulder immobilizer replaced with larger size for improved positioning/comfort and skin protection. Pt then requesting to lay back down promptly, endorses feeling "faint" per ASL Interpreter. BP 152/70, HR 70bpm, O2 >95% on 2L. Pt would benefit from skilled OT to address noted impairments and functional limitations (see below for any additional details) in order to maximize safety and independence while minimizing falls risk and caregiver burden. Upon hospital discharge, recommend STR to maximize pt  safety and return to PLOF. Will reassess discharge recommendation as pt progresses. Pt eager to return home but given limited availability of help at home (son works, home alone for periods of time each day) and significant need for assist for ADL and limited mobility this date, recommending SNF.    Follow Up Recommendations  SNF    Equipment Recommendations  3 in 1 bedside commode    Recommendations for Other Services       Precautions / Restrictions Precautions Precautions: Fall Required Braces or Orthoses: Other Brace Other Brace: RUE shoulder immobilizer Restrictions Weight Bearing Restrictions: Yes RUE Weight Bearing: Non weight bearing Other Position/Activity Restrictions: no shoulder ROM especially abduction, elbow ROM to tolerance, immobilizer at all times except for bathing/dressing      Mobility Bed Mobility Overal bed mobility: Needs Assistance Bed Mobility: Sidelying to Sit;Sit to Supine   Sidelying to sit: Mod assist;+2 for physical assistance   Sit to supine: Mod assist;+2 for physical assistance   General bed mobility comments: required assistance for trunk support and LE positioning; pt complains of increased pain often holding RUE with LUE which limits overall mobility; required increased time;  dependent for sliding up in bed;  Transfers                 General transfer comment: pt unable due to pain and dizziness; see clinical impression;    Balance Overall balance assessment: Needs assistance Sitting-balance support: Single extremity supported;Feet supported Sitting balance-Leahy Scale: Fair         Standing balance comment: not assessed;                           ADL either performed or assessed  with clinical judgement   ADL Overall ADL's : Needs assistance/impaired                                       General ADL Comments: Pt requires at least set up assist for self feeding, Mod A for grooming, and Max+ for  bathing, dressing, and toileting; very pain limited     Vision Baseline Vision/History: Wears glasses Wears Glasses: Reading only Patient Visual Report: No change from baseline       Perception     Praxis      Pertinent Vitals/Pain Pain Assessment: Faces Pain Score: 10-Worst pain ever Faces Pain Scale: Hurts worst Pain Location: RUE shoulder Pain Descriptors / Indicators: Sore;Grimacing Pain Intervention(s): Monitored during session;Repositioned;RN gave pain meds during session     Hand Dominance Right   Extremity/Trunk Assessment Upper Extremity Assessment Upper Extremity Assessment: Generalized weakness;RUE deficits/detail;LUE deficits/detail RUE Deficits / Details: immobilized, significant R shoulder pain with any accidental movement, endorses some numbness/tingling in R hand RUE: Unable to fully assess due to pain;Unable to fully assess due to immobilization RUE Sensation: decreased light touch RUE Coordination: decreased fine motor;decreased gross motor LUE Deficits / Details: grossly 4-/5, shoulder limited by pain in R shoulder with attempts at MMT LUE: Unable to fully assess due to pain   Lower Extremity Assessment Lower Extremity Assessment: Generalized weakness   Cervical / Trunk Assessment Cervical / Trunk Assessment: Kyphotic   Communication Communication Communication: Deaf;Interpreter utilized   Cognition Arousal/Alertness: Awake/alert Behavior During Therapy: WFL for tasks assessed/performed Overall Cognitive Status: Within Functional Limits for tasks assessed                                     General Comments  no swelling noted in BLE; does have bruising in RUE    Exercises Other Exercises Other Exercises: Pt instructed in immobilizer mgt/positioning of RUE, replaced Medium with Large to improve positioning and support of RUE Other Exercises: bed mobility training with OT/PT   Shoulder Instructions      Home Living  Family/patient expects to be discharged to:: Private residence Living Arrangements: Children Available Help at Discharge: Family;Available PRN/intermittently(son works during the day; she said she could have family/friends help but unsure how much help is available.)   Home Access: Level entry     Home Layout: One level     Bathroom Shower/Tub: Tub/shower unit;Curtain   Biochemist, clinical: Standard     Home Equipment: Grab bars - tub/shower;Grab bars - toilet;Walker - 4 wheels;Wheelchair - manual;Cane - single point;Shower seat          Prior Functioning/Environment Level of Independence: Independent        Comments: Pt reports being independent in self care ADLs; son would do cooking/cleaning;        OT Problem List: Decreased strength;Pain;Decreased range of motion;Decreased activity tolerance;Impaired balance (sitting and/or standing);Decreased knowledge of use of DME or AE;Obesity;Impaired UE functional use      OT Treatment/Interventions: Self-care/ADL training;Therapeutic exercise;Therapeutic activities;DME and/or AE instruction;Patient/family education    OT Goals(Current goals can be found in the care plan section) Acute Rehab OT Goals Patient Stated Goal: to go home and get better OT Goal Formulation: With patient Time For Goal Achievement: 08/20/19 Potential to Achieve Goals: Good ADL Goals Pt Will Perform Grooming: sitting;with mod assist;with min assist  Pt Will Perform Lower Body Dressing: with mod assist;sitting/lateral leans Pt Will Transfer to Toilet: with mod assist;bedside commode;stand pivot transfer Additional ADL Goal #1: Pt will instruct family/caregiver in shoulder immobilizer mgt with modified independence (written/visual instructions when unable to use sign language or interpreter)  OT Frequency: Min 2X/week   Barriers to D/C:            Co-evaluation PT/OT/SLP Co-Evaluation/Treatment: Yes Reason for Co-Treatment: Complexity of the patient's  impairments (multi-system involvement);For patient/therapist safety;To address functional/ADL transfers PT goals addressed during session: Mobility/safety with mobility;Balance OT goals addressed during session: ADL's and self-care      AM-PAC OT "6 Clicks" Daily Activity     Outcome Measure Help from another person eating meals?: A Little Help from another person taking care of personal grooming?: A Lot Help from another person toileting, which includes using toliet, bedpan, or urinal?: Total Help from another person bathing (including washing, rinsing, drying)?: Total Help from another person to put on and taking off regular upper body clothing?: Total Help from another person to put on and taking off regular lower body clothing?: Total 6 Click Score: 9   End of Session Nurse Communication: Mobility status  Activity Tolerance: Patient limited by pain Patient left: in bed;with call bell/phone within reach;with bed alarm set;Other (comment)(pillow behind RUE for support, immobilizer in place)  OT Visit Diagnosis: Other abnormalities of gait and mobility (R26.89);Muscle weakness (generalized) (M62.81);Pain Pain - Right/Left: Right Pain - part of body: Shoulder                Time: 6834-1962 OT Time Calculation (min): 56 min Charges:  OT General Charges $OT Visit: 1 Visit OT Evaluation $OT Eval Moderate Complexity: 1 Mod OT Treatments $Therapeutic Activity: 8-22 mins  Jeni Salles, MPH, MS, OTR/L ascom [phone] 08/06/19, 2:23 PM

## 2019-08-06 NOTE — Progress Notes (Signed)
Subjective:  Patient seen this morning with the sign language interpreter and the occupational therapist.  The patient is complaining of right shoulder pain.    Objective:   VITALS:   Vitals:   08/06/19 0917 08/06/19 0924 08/06/19 0931 08/06/19 0939  BP: (!) 153/86 (!) 137/56 129/60 133/71  Pulse: (!) 129 61 64 65  Resp:      Temp:      TempSrc:      SpO2:   97% 97%    PHYSICAL EXAM: Right upper extremity: Patient is in a shoulder immobilizer.  Her right shoulder shows no obvious deformity.  There is no significant swelling or ecchymosis.  There is no erythema.  Her arm and forearm compartments are soft and compressible.  She can flex and extend all 5 digits of the right hand.  Patient has intact sensation to light touch but indicates sensation is diminished.   LABS  Results for orders placed or performed during the hospital encounter of 08/05/19 (from the past 24 hour(s))  Phosphorus     Status: None   Collection Time: 08/05/19  2:44 PM  Result Value Ref Range   Phosphorus 3.8 2.5 - 4.6 mg/dL  Hemoglobin A1c     Status: Abnormal   Collection Time: 08/05/19  2:44 PM  Result Value Ref Range   Hgb A1c MFr Bld 7.9 (H) 4.8 - 5.6 %   Mean Plasma Glucose 180.03 mg/dL  Protime-INR     Status: None   Collection Time: 08/05/19  2:49 PM  Result Value Ref Range   Prothrombin Time 13.6 11.4 - 15.2 seconds   INR 1.1 0.8 - 1.2  APTT     Status: None   Collection Time: 08/05/19  2:49 PM  Result Value Ref Range   aPTT 28 24 - 36 seconds  Type and screen [place]     Status: None   Collection Time: 08/05/19  2:49 PM  Result Value Ref Range   ABO/RH(D) A POS    Antibody Screen NEG    Sample Expiration      08/08/2019,2359 Performed at [facility], [address] [place], [place] [address]   Glucose, capillary     Status: Abnormal   Collection Time: 08/05/19  4:54 PM  Result Value Ref Range   Glucose-Capillary 183 (H) 70 - 99 mg/dL  Glucose,  capillary     Status: Abnormal   Collection Time: 08/05/19  8:53 PM  Result Value Ref Range   Glucose-Capillary 151 (H) 70 - 99 mg/dL  Comprehensive metabolic panel     Status: Abnormal   Collection Time: 08/06/19  6:23 AM  Result Value Ref Range   Sodium 141 135 - 145 mmol/L   Potassium 3.1 (L) 3.5 - 5.1 mmol/L   Chloride 104 98 - 111 mmol/L   CO2 28 22 - 32 mmol/L   Glucose, Bld 187 (H) 70 - 99 mg/dL   BUN 10 8 - 23 mg/dL   Creatinine, Ser 0.35 (L) 0.44 - 1.00 mg/dL   Calcium 8.3 (L) 8.9 - 10.3 mg/dL   Total Protein 6.0 (L) 6.5 - 8.1 g/dL   Albumin 3.2 (L) 3.5 - 5.0 g/dL   AST 17 15 - 41 U/L   ALT 11 0 - 44 U/L   Alkaline Phosphatase 107 38 - 126 U/L   Total Bilirubin 1.0 0.3 - 1.2 mg/dL   GFR calc non Af Amer >60 >60 mL/min   GFR calc Af Amer >60 >60  mL/min   Anion gap 9 5 - 15  CBC     Status: None   Collection Time: 08/06/19  6:23 AM  Result Value Ref Range   WBC 7.0 4.0 - 10.5 K/uL   RBC 4.37 3.87 - 5.11 MIL/uL   Hemoglobin 12.1 12.0 - 15.0 g/dL   HCT 36.2 36.0 - 46.0 %   MCV 82.8 80.0 - 100.0 fL   MCH 27.7 26.0 - 34.0 pg   MCHC 33.4 30.0 - 36.0 g/dL   RDW 12.7 11.5 - 15.5 %   Platelets 173 150 - 400 K/uL   nRBC 0.0 0.0 - 0.2 %  Magnesium     Status: None   Collection Time: 08/06/19  6:23 AM  Result Value Ref Range   Magnesium 1.9 1.7 - 2.4 mg/dL  Glucose, capillary     Status: Abnormal   Collection Time: 08/06/19  8:12 AM  Result Value Ref Range   Glucose-Capillary 179 (H) 70 - 99 mg/dL  Glucose, capillary     Status: Abnormal   Collection Time: 08/06/19 12:17 PM  Result Value Ref Range   Glucose-Capillary 188 (H) 70 - 99 mg/dL    EEG  Result Date: 08/05/2019 Lora Havens, MD     08/05/2019  1:52 PM Patient Name: [name] MRN: [id number] Epilepsy Attending: Lora Havens Referring Physician/Provider: Dr Leotis Pain Date: 08/05/2019 Duration: 26.07 minutes Patient history: 69 year old female with history of seizure-like episodes (also concern  for nonepileptic events) presented with fall.  On exam was noted to have right upper extremity shaking movements briefly with eye movement fluctuations.  EEG to evaluate for seizures. Level of alertness: Awake, asleep AEDs during EEG study: Oxcarbazepine, Vimpat Technical aspects: This EEG study was done with scalp electrodes positioned according to the 10-20 International system of electrode placement. Electrical activity was acquired at a sampling rate of _0  and reviewed with a high frequency filter of _1  and a low frequency filter of _2 . EEG data were recorded continuously and digitally stored. Description: The posterior dominant rhythm consists of 8 Hz activity of moderate voltage (25-35 uV) seen predominantly in posterior head regions, symmetric and reactive to eye opening and eye closing.  Sleep was characterized by sleep spindles (12 to 14 Hz), maximal frontocentral.  Physiologic photic driving was not seen during photic stimulation.  Hyperventilation was not performed.       IMPRESSION: This study is within normal limits. No seizures or epileptiform discharges were seen throughout the recording. Lora Havens   DG Chest 1 View  Result Date: 08/05/2019 CLINICAL DATA:  Trauma secondary to a fall. Right arm pain. Head trauma. EXAM: CHEST  1 VIEW COMPARISON:  06/18/2019 FINDINGS: Heart size and pulmonary vascularity are normal and the lungs are clear. Aortic atherosclerosis. New comminuted impacted subluxed fracture of the right humeral head. No other significant bone abnormality. IMPRESSION: 1. No acute cardiopulmonary disease. 2. New comminuted impacted subluxed fracture of the right humeral head. Electronically Signed   By: Lorriane Shire M.D.   On: 08/05/2019 09:17   DG Pelvis 1-2 Views  Result Date: 08/05/2019 CLINICAL DATA:  Multiple trauma secondary to a fall. EXAM: PELVIS - 1-2 VIEW COMPARISON:  05/15/2012 FINDINGS: There is no evidence of pelvic fracture or diastasis. No pelvic bone  lesions are seen. IMPRESSION: Normal exam. Electronically Signed   By: Lorriane Shire M.D.   On: 08/05/2019 09:21   CT Head Wo Contrast  Result Date: 08/05/2019 CLINICAL DATA:  Status post  fall with a blow to the head. Initial encounter. EXAM: CT HEAD WITHOUT CONTRAST CT CERVICAL SPINE WITHOUT CONTRAST TECHNIQUE: Multidetector CT imaging of the head and cervical spine was performed following the standard protocol without intravenous contrast. Multiplanar CT image reconstructions of the cervical spine were also generated. COMPARISON:  Head and cervical spine CT scans 06/15/2017. FINDINGS: CT HEAD FINDINGS Brain: No evidence of acute infarction, hemorrhage, hydrocephalus, extra-axial collection or mass lesion/mass effect. Vascular: No hyperdense vessel or unexpected calcification. Skull: Intact.  No focal lesion. Sinuses/Orbits: Status post cataract surgery.  Otherwise negative. Other: None. CT CERVICAL SPINE FINDINGS Alignment: Maintained. Skull base and vertebrae: No acute fracture. No primary bone lesion or focal pathologic process. Soft tissues and spinal canal: No prevertebral fluid or swelling. No visible canal hematoma. Disc levels: Loss of disc space height and mild endplate spurring are seen at C5-6. Upper chest: Lung apices clear. Other: None. IMPRESSION: No acute abnormality head or cervical spine. Degenerative disc disease C5-6. Electronically Signed   By: Inge Rise M.D.   On: 08/05/2019 08:50   CT Cervical Spine Wo Contrast  Result Date: 08/05/2019 CLINICAL DATA:  Status post fall with a blow to the head. Initial encounter. EXAM: CT HEAD WITHOUT CONTRAST CT CERVICAL SPINE WITHOUT CONTRAST TECHNIQUE: Multidetector CT imaging of the head and cervical spine was performed following the standard protocol without intravenous contrast. Multiplanar CT image reconstructions of the cervical spine were also generated. COMPARISON:  Head and cervical spine CT scans 06/15/2017. FINDINGS: CT HEAD FINDINGS  Brain: No evidence of acute infarction, hemorrhage, hydrocephalus, extra-axial collection or mass lesion/mass effect. Vascular: No hyperdense vessel or unexpected calcification. Skull: Intact.  No focal lesion. Sinuses/Orbits: Status post cataract surgery.  Otherwise negative. Other: None. CT CERVICAL SPINE FINDINGS Alignment: Maintained. Skull base and vertebrae: No acute fracture. No primary bone lesion or focal pathologic process. Soft tissues and spinal canal: No prevertebral fluid or swelling. No visible canal hematoma. Disc levels: Loss of disc space height and mild endplate spurring are seen at C5-6. Upper chest: Lung apices clear. Other: None. IMPRESSION: No acute abnormality head or cervical spine. Degenerative disc disease C5-6. Electronically Signed   By: Inge Rise M.D.   On: 08/05/2019 08:50   CT SHOULDER RIGHT WO CONTRAST  Result Date: 08/05/2019 CLINICAL DATA:  [place]. Evaluate right humeral head and neck fractures. EXAM: CT OF THE UPPER RIGHT EXTREMITY WITHOUT CONTRAST TECHNIQUE: Multidetector CT imaging of the upper right extremity was performed according to the standard protocol. COMPARISON:  Radiographs 08/05/2019 FINDINGS: Complex comminuted fractures of the humeral head and neck are noted. There is a comminuted and impacted transverse fracture through the humeral neck and comminuted and displaced fractures of the greater and lesser tuberosities. The humeral head is normally located in the glenoid fossa. Moderate shortening is noted with increased humeroacromial distance. The [facility] joint is intact. Os acromial noted incidentally. No clavicle or scapular fracture. The visualized right ribs are intact. Grossly by CT the rotator cuff tendons are intact. The visualized lungs are clear. IMPRESSION: 1. Complex comminuted fractures of the humeral head and neck as described above. 2. Grossly intact rotator cuff tendons. 3. Intact AC joint.  Incidental os acromial. Electronically Signed   By: Marijo Sanes M.D.   On: 08/05/2019 14:19   DG Knee Complete 4 Views Right  Result Date: 08/05/2019 CLINICAL DATA:  Mechanical fall, no loss consciousness EXAM: RIGHT KNEE - COMPLETE 4+ VIEW COMPARISON:  None. FINDINGS: No evidence of fracture, dislocation, or joint  effusion. No evidence of arthropathy or other focal bone abnormality. Soft tissues are unremarkable. IMPRESSION: No acute osseous injury of the right knee. Electronically Signed   By: Kathreen Devoid   On: 08/05/2019 11:06   DG Humerus Right  Result Date: 08/05/2019 CLINICAL DATA:  Right arm pain secondary to a fall. EXAM: RIGHT HUMERUS - 2+ VIEW COMPARISON:  None FINDINGS: There is an impacted comminuted subluxed fracture of the right humeral head. The distal humerus is intact. IMPRESSION: Impacted comminuted subluxed fracture of the right humeral head. Electronically Signed   By: Lorriane Shire M.D.   On: 08/05/2019 09:20   DG Foot Complete Left  Result Date: 08/05/2019 CLINICAL DATA:  Pain secondary to a fall. EXAM: LEFT FOOT - COMPLETE 3+ VIEW COMPARISON:  None. FINDINGS: There is no evidence of fracture or dislocation. There is no evidence of arthropathy or other focal bone abnormality. Soft tissues are unremarkable. IMPRESSION: Negative. Electronically Signed   By: Lorriane Shire M.D.   On: 08/05/2019 09:19    Assessment/Plan:     Principal Problem:   Comminuted fracture of right humerus Active Problems:   Diabetes mellitus without complication (HCC)   Essential hypertension   Depression   Hypokalemia   Seizure disorder (HCC)   Chronic diastolic heart failure (HCC)   COPD (chronic obstructive pulmonary disease) ([place])   Deaf   On home oxygen therapy   Cirrhosis, non-alcoholic ([place])   Fall   Hypomagnesemia   History of CVA in adulthood  I have explained to the patient, via the sign language interpreter, that she has a comminuted fracture of the right humeral head.  I have reviewed the x-rays and CT scan.  Patient has  displacement of the tuberosities.  I explained to the patient that I would recommend initial nonoperative treatment with immobilization with her shoulder immobilizer.  Patient may work on Engineer, water and elbow range of motion but should not forward elevate or abduct her shoulder with OT.  Patient will follow-up in my office in approximately a week for reevaluation and x-ray.  If the patient does not seem to be healing her fracture or is functionally having significant limitations, further consideration to shoulder arthroplasty would be given.  Patient has COPD and is O2 dependent at home.  Therefore she has increased risk for shoulder arthroplasty surgery which would require general anesthesia.  I have discussed this plan with the patient's son as well by phone last night.  Patient understood and agreed with this plan.    Thornton Park , MD 08/06/2019, 12:27 PM

## 2019-08-06 NOTE — Evaluation (Signed)
Physical Therapy Evaluation (CO-eval with OT) Patient Details Name: [name] MRN: [id number] DOB: March 24, 1951 Today's Date: 08/06/2019   History of Present Illness  69 yo Female presents to hospital after sustaining a fall outside her home. CT scan of RUE shows comminuted closed humeral head fracture. Ortho consult recommended RUE immobilizer and will re-assess in a week to determine if surgery is needed. She will need to wear immobilizer at all times unless bathing/dressing, elbow ROM is permitted; Neuro consulted for history of seizures. Medication updated and recommend patient follow up with outpatient neurology. PMH significant HTN,HLD, DM COPD on 2L O2 at home, Asthma, past CVA, GERD, depression ,seizures, non-alcohol cirrhosis, CHF, smoker; Patient is deaf and will require an [place] interpreter.  Clinical Impression  69 yo Female, deaf, with new onset RUE proximal humerus fracture, reports severe RUE shoulder pain during PT/OT evaluation. Prior to admittance she was living at home with her son. She reports being mostly independent in self care ADLs but her son would help with cooking/cleaning. She reports 1-2 new falls in last few months. She has COPD and is on 2L O2 at home at baseline. Currently patient required mod A +2 for bed mobility. She was able to sit edge of bed for approximately 5-10 min while therapist was able to change shoulder immobilizer for better fit. However while sitting edge of bed, patient became fatigued and started to lay back down. Unsure if patient was just fatigued from being given pain meds or if she was feeling light-headed. After a few minutes of lying down she started opening her eyes and expressed that she did feel faint. Vitals assessed: BP 152/70, HR in mid 70's, SPo2 95% or greater. Pt complains of severe RUE shoulder pain throughout evaluation. Unable to transfer to bedside chair due to concern of fatigue/light headedness and patient's difficulty communicating as  she is deaf and requires a sign language interpreter. Will try to transfer next session if patient is appropriate. Due to limited mobility and limited caregiver assistance at home, recommend SNF rehab upon discharge. Patient would prefer to go home as she has been to SNF rehab before and did not have a good experience. Explained to patient that based on current objective mobility she would not be safe to go home at this time but that discharge recommendations would be updated as patient progresses.     Follow Up Recommendations SNF;Supervision/Assistance - 24 hour    Equipment Recommendations  (TBD)    Recommendations for Other Services       Precautions / Restrictions Precautions Precautions: Fall Required Braces or Orthoses: Other Brace Other Brace: RUE shoulder immobilizer Restrictions Weight Bearing Restrictions: Yes RUE Weight Bearing: Non weight bearing      Mobility  Bed Mobility Overal bed mobility: Needs Assistance Bed Mobility: Sidelying to Sit;Sit to Supine   Sidelying to sit: Mod assist;+2 for physical assistance   Sit to supine: Mod assist;+2 for physical assistance   General bed mobility comments: required assistance for trunk support and LE positioning; pt complains of increased pain often holding RUE with LUE which limits overall mobility; required increased time;  dependent for sliding up in bed;  Transfers                 General transfer comment: pt unable due to pain and dizziness; see clinical impression;  Ambulation/Gait             General Gait Details: unable this session;  Stairs  Wheelchair Mobility    Modified Rankin (Stroke Patients Only)       Balance Overall balance assessment: Needs assistance Sitting-balance support: Single extremity supported;Feet supported Sitting balance-Leahy Scale: Fair         Standing balance comment: not assessed;                             Pertinent  Vitals/Pain Pain Assessment: Faces Pain Score: 10-Worst pain ever Faces Pain Scale: Hurts worst Pain Location: RUE shoulder Pain Descriptors / Indicators: Sore Pain Intervention(s): Limited activity within patient's tolerance;Monitored during session;Repositioned;Patient requesting pain meds-RN notified;RN gave pain meds during session    Home Living Family/patient expects to be discharged to:: Private residence Living Arrangements: Children Available Help at Discharge: Family;Available PRN/intermittently(son works during the day; she said she could have family/friends help but unsure how much help is available.)   Home Access: Level entry     Home Layout: One level Home Equipment: Grab bars - tub/shower;Grab bars - toilet;Walker - 4 wheels;Wheelchair - manual;Cane - single point;Shower seat      Prior Function Level of Independence: Independent         Comments: Pt reports being independent in self care ADLs; son would do cooking/cleaning;     Hand Dominance   Dominant Hand: Right    Extremity/Trunk Assessment   Upper Extremity Assessment Upper Extremity Assessment: Defer to OT evaluation    Lower Extremity Assessment Lower Extremity Assessment: Generalized weakness    Cervical / Trunk Assessment Cervical / Trunk Assessment: Kyphotic  Communication   Communication: Deaf  Cognition Arousal/Alertness: Awake/alert Behavior During Therapy: WFL for tasks assessed/performed Overall Cognitive Status: Within Functional Limits for tasks assessed                                        General Comments General comments (skin integrity, edema, etc.): no swelling noted in BLE; does have bruising in RUE    Exercises     Assessment/Plan    PT Assessment Patient needs continued PT services  PT Problem List Decreased strength;Decreased mobility;Decreased safety awareness;Decreased activity tolerance;Cardiopulmonary status limiting activity;Decreased  balance;Pain       PT Treatment Interventions Therapeutic exercise;Gait training;Balance training;Functional mobility training;Therapeutic activities;Patient/family education    PT Goals (Current goals can be found in the Care Plan section)  Acute Rehab PT Goals Patient Stated Goal: to go home and get better PT Goal Formulation: With patient Time For Goal Achievement: 08/20/19 Potential to Achieve Goals: Fair    Frequency 7X/week   Barriers to discharge Decreased caregiver support son helps care for patient but he is gone during the day working; unsure how much assistance she would have while he is gone to work.    Co-evaluation               AM-PAC PT "6 Clicks" Mobility  Outcome Measure Help needed turning from your back to your side while in a flat bed without using bedrails?: A Lot Help needed moving from lying on your back to sitting on the side of a flat bed without using bedrails?: A Lot Help needed moving to and from a bed to a chair (including a wheelchair)?: A Lot Help needed standing up from a chair using your arms (e.g., wheelchair or bedside chair)?: A Lot Help needed to walk in hospital room?: Total Help needed climbing  3-5 steps with a railing? : Total 6 Click Score: 10    End of Session Equipment Utilized During Treatment: Gait belt;Oxygen;Other (comment)(RUE shoulder immobilizer) Activity Tolerance: Patient limited by pain Patient left: in bed;with call bell/phone within reach;with bed alarm set Nurse Communication: Mobility status PT Visit Diagnosis: Muscle weakness (generalized) (M62.81);Difficulty in walking, not elsewhere classified (R26.2)    Time: 2449-7530 PT Time Calculation (min) (ACUTE ONLY): 56 min   Charges:   PT Evaluation $PT Eval Moderate Complexity: 1 Mod            Roselinda Bahena PT, DPT 08/06/2019, 1:21 PM

## 2019-08-06 NOTE — Progress Notes (Signed)
Pts HR between 120-130's. MD Izetta Dakin, notified. MD came to bedside. MD ordered IV push Metoprolol 2.5 mg. AC Cheryl, notified, and now at the bedside to push. Pts HR down to 60-70's after push. Per MD hold BP meds. MD notified of HR. No new orders.

## 2019-08-06 NOTE — Progress Notes (Signed)
Brief narrative: 69 year old female with history of seizure-like episodes (also concern for nonepileptic events) presented with fall.  On exam was noted to have right upper extremity shaking movements briefly with eye movement fluctuations.   Subjective: Sign language translator was used this morning.  Patient is having excruciating pain to her right shoulder including shoulder joint which is found out to be fractured.  Denies any chest pain or shortness of breath.  Objective: Vital signs in last 24 hours: Temp:  [97.8 F (36.6 C)-98.6 F (37 C)] 98.6 F (37 C) (01/30 0911) Pulse Rate:  [61-129] 68 (01/30 1340) Resp:  [18-20] 20 (01/30 0911) BP: (129-173)/(56-93) 152/72 (01/30 1312) SpO2:  [92 %-100 %] 96 % (01/30 1340)  Intake/Output from previous day: 01/29 0701 - 01/30 0700 In: 81  Out: -  Intake/Output this shift: Total I/O In: 240 [P.O.:240] Out: -   Constitutional: Mild to moderate distress as evidenced by ongoing right upper extremity pain  Eyes: PERRL, lids and conjunctivae normal ENMT: Mucous membranes are dry. Posterior pharynx clear of any exudate or lesions. Neck: normal, supple, no masses, no thyromegaly Respiratory: Anterior lung sounds are coarse to auscultation especially in left mid field.  2 L oxygen.  No tachypnea or increased work of breathing. Cardiovascular:  Tachycardic and regular rhythm, no murmurs / rubs / gallops. No extremity edema. 2+ pedal pulses. No carotid bruits.  Abdomen: no tenderness, no masses palpated. No hepatosplenomegaly. Bowel sounds positive.  Musculoskeletal:  Tender to any movement of right arm and shoulder.  Compromised range of motion of right shoulder joint.  No clubbing / cyanosis. No joint deformity upper and lower extremities. patient with notable complaints of knee pain with palpation-no effusion noted, no contractures. Normal muscle tone.  Skin: no rashes, lesions, ulcers. No induration -patient does have bruising of all the  fingers of the left hand and of the left elbow, she also has bilateral knee abrasions greater on the right. Neurologic: CN 2-12 grossly intact. Sensation intact, DTR normal. Strength 5/5 x all 4 extremities.  Psychiatric: Appears to have normal judgment and insight but exam limited by inability to utilize sign language translator    Results for orders placed or performed during the hospital encounter of 08/05/19 (from the past 24 hour(s))  Glucose, capillary     Status: Abnormal   Collection Time: 08/05/19  4:54 PM  Result Value Ref Range   Glucose-Capillary 183 (H) 70 - 99 mg/dL  Glucose, capillary     Status: Abnormal   Collection Time: 08/05/19  8:53 PM  Result Value Ref Range   Glucose-Capillary 151 (H) 70 - 99 mg/dL  Comprehensive metabolic panel     Status: Abnormal   Collection Time: 08/06/19  6:23 AM  Result Value Ref Range   Sodium 141 135 - 145 mmol/L   Potassium 3.1 (L) 3.5 - 5.1 mmol/L   Chloride 104 98 - 111 mmol/L   CO2 28 22 - 32 mmol/L   Glucose, Bld 187 (H) 70 - 99 mg/dL   BUN 10 8 - 23 mg/dL   Creatinine, Ser 0.35 (L) 0.44 - 1.00 mg/dL   Calcium 8.3 (L) 8.9 - 10.3 mg/dL   Total Protein 6.0 (L) 6.5 - 8.1 g/dL   Albumin 3.2 (L) 3.5 - 5.0 g/dL   AST 17 15 - 41 U/L   ALT 11 0 - 44 U/L   Alkaline Phosphatase 107 38 - 126 U/L   Total Bilirubin 1.0 0.3 - 1.2 mg/dL   GFR calc  non Af Amer >60 >60 mL/min   GFR calc Af Amer >60 >60 mL/min   Anion gap 9 5 - 15  CBC     Status: None   Collection Time: 08/06/19  6:23 AM  Result Value Ref Range   WBC 7.0 4.0 - 10.5 K/uL   RBC 4.37 3.87 - 5.11 MIL/uL   Hemoglobin 12.1 12.0 - 15.0 g/dL   HCT 36.2 36.0 - 46.0 %   MCV 82.8 80.0 - 100.0 fL   MCH 27.7 26.0 - 34.0 pg   MCHC 33.4 30.0 - 36.0 g/dL   RDW 12.7 11.5 - 15.5 %   Platelets 173 150 - 400 K/uL   nRBC 0.0 0.0 - 0.2 %  Magnesium     Status: None   Collection Time: 08/06/19  6:23 AM  Result Value Ref Range   Magnesium 1.9 1.7 - 2.4 mg/dL  Glucose, capillary      Status: Abnormal   Collection Time: 08/06/19  8:12 AM  Result Value Ref Range   Glucose-Capillary 179 (H) 70 - 99 mg/dL  Glucose, capillary     Status: Abnormal   Collection Time: 08/06/19 12:17 PM  Result Value Ref Range   Glucose-Capillary 188 (H) 70 - 99 mg/dL    Studies/Results: EEG  Result Date: 08/05/2019 Lora Havens, MD     08/05/2019  1:52 PM Patient Name: [name] MRN: [id number] Epilepsy Attending: Lora Havens Referring Physician/Provider: Dr Leotis Pain Date: 08/05/2019 Duration: 26.07 minutes Patient history: 69 year old female with history of seizure-like episodes (also concern for nonepileptic events) presented with fall.  On exam was noted to have right upper extremity shaking movements briefly with eye movement fluctuations.  EEG to evaluate for seizures. Level of alertness: Awake, asleep AEDs during EEG study: Oxcarbazepine, Vimpat Technical aspects: This EEG study was done with scalp electrodes positioned according to the 10-20 International system of electrode placement. Electrical activity was acquired at a sampling rate of 500Hz and reviewed with a high frequency filter of 70Hz and a low frequency filter of 1Hz. EEG data were recorded continuously and digitally stored. Description: The posterior dominant rhythm consists of 8 Hz activity of moderate voltage (25-35 uV) seen predominantly in posterior head regions, symmetric and reactive to eye opening and eye closing.  Sleep was characterized by sleep spindles (12 to 14 Hz), maximal frontocentral.  Physiologic photic driving was not seen during photic stimulation.  Hyperventilation was not performed.       IMPRESSION: This study is within normal limits. No seizures or epileptiform discharges were seen throughout the recording. Lora Havens   DG Chest 1 View  Result Date: 08/05/2019 CLINICAL DATA:  Trauma secondary to a fall. Right arm pain. Head trauma. EXAM: CHEST  1 VIEW COMPARISON:  06/18/2019 FINDINGS:  Heart size and pulmonary vascularity are normal and the lungs are clear. Aortic atherosclerosis. New comminuted impacted subluxed fracture of the right humeral head. No other significant bone abnormality. IMPRESSION: 1. No acute cardiopulmonary disease. 2. New comminuted impacted subluxed fracture of the right humeral head. Electronically Signed   By: Lorriane Shire M.D.   On: 08/05/2019 09:17   DG Pelvis 1-2 Views  Result Date: 08/05/2019 CLINICAL DATA:  Multiple trauma secondary to a fall. EXAM: PELVIS - 1-2 VIEW COMPARISON:  05/15/2012 FINDINGS: There is no evidence of pelvic fracture or diastasis. No pelvic bone lesions are seen. IMPRESSION: Normal exam. Electronically Signed   By: Lorriane Shire M.D.   On: 08/05/2019 09:21  CT Head Wo Contrast  Result Date: 08/05/2019 CLINICAL DATA:  Status post fall with a blow to the head. Initial encounter. EXAM: CT HEAD WITHOUT CONTRAST CT CERVICAL SPINE WITHOUT CONTRAST TECHNIQUE: Multidetector CT imaging of the head and cervical spine was performed following the standard protocol without intravenous contrast. Multiplanar CT image reconstructions of the cervical spine were also generated. COMPARISON:  Head and cervical spine CT scans 06/15/2017. FINDINGS: CT HEAD FINDINGS Brain: No evidence of acute infarction, hemorrhage, hydrocephalus, extra-axial collection or mass lesion/mass effect. Vascular: No hyperdense vessel or unexpected calcification. Skull: Intact.  No focal lesion. Sinuses/Orbits: Status post cataract surgery.  Otherwise negative. Other: None. CT CERVICAL SPINE FINDINGS Alignment: Maintained. Skull base and vertebrae: No acute fracture. No primary bone lesion or focal pathologic process. Soft tissues and spinal canal: No prevertebral fluid or swelling. No visible canal hematoma. Disc levels: Loss of disc space height and mild endplate spurring are seen at C5-6. Upper chest: Lung apices clear. Other: None. IMPRESSION: No acute abnormality head or  cervical spine. Degenerative disc disease C5-6. Electronically Signed   By: Inge Rise M.D.   On: 08/05/2019 08:50   CT Cervical Spine Wo Contrast  Result Date: 08/05/2019 CLINICAL DATA:  Status post fall with a blow to the head. Initial encounter. EXAM: CT HEAD WITHOUT CONTRAST CT CERVICAL SPINE WITHOUT CONTRAST TECHNIQUE: Multidetector CT imaging of the head and cervical spine was performed following the standard protocol without intravenous contrast. Multiplanar CT image reconstructions of the cervical spine were also generated. COMPARISON:  Head and cervical spine CT scans 06/15/2017. FINDINGS: CT HEAD FINDINGS Brain: No evidence of acute infarction, hemorrhage, hydrocephalus, extra-axial collection or mass lesion/mass effect. Vascular: No hyperdense vessel or unexpected calcification. Skull: Intact.  No focal lesion. Sinuses/Orbits: Status post cataract surgery.  Otherwise negative. Other: None. CT CERVICAL SPINE FINDINGS Alignment: Maintained. Skull base and vertebrae: No acute fracture. No primary bone lesion or focal pathologic process. Soft tissues and spinal canal: No prevertebral fluid or swelling. No visible canal hematoma. Disc levels: Loss of disc space height and mild endplate spurring are seen at C5-6. Upper chest: Lung apices clear. Other: None. IMPRESSION: No acute abnormality head or cervical spine. Degenerative disc disease C5-6. Electronically Signed   By: Inge Rise M.D.   On: 08/05/2019 08:50   CT SHOULDER RIGHT WO CONTRAST  Result Date: 08/05/2019 CLINICAL DATA:  [place]. Evaluate right humeral head and neck fractures. EXAM: CT OF THE UPPER RIGHT EXTREMITY WITHOUT CONTRAST TECHNIQUE: Multidetector CT imaging of the upper right extremity was performed according to the standard protocol. COMPARISON:  Radiographs 08/05/2019 FINDINGS: Complex comminuted fractures of the humeral head and neck are noted. There is a comminuted and impacted transverse fracture through the humeral  neck and comminuted and displaced fractures of the greater and lesser tuberosities. The humeral head is normally located in the glenoid fossa. Moderate shortening is noted with increased humeroacromial distance. [facility] joint is intact. Os acromial noted incidentally. No clavicle or scapular fracture. The visualized right ribs are intact. Grossly by CT the rotator cuff tendons are intact. The visualized lungs are clear. IMPRESSION: 1. Complex comminuted fractures of the humeral head and neck as described above. 2. Grossly intact rotator cuff tendons. 3. Intact AC joint.  Incidental os acromial. Electronically Signed   By: Marijo Sanes M.D.   On: 08/05/2019 14:19   DG Knee Complete 4 Views Right  Result Date: 08/05/2019 CLINICAL DATA:  Mechanical fall, no loss consciousness EXAM: RIGHT KNEE - COMPLETE  4+ VIEW COMPARISON:  None. FINDINGS: No evidence of fracture, dislocation, or joint effusion. No evidence of arthropathy or other focal bone abnormality. Soft tissues are unremarkable. IMPRESSION: No acute osseous injury of the right knee. Electronically Signed   By: Kathreen Devoid   On: 08/05/2019 11:06   DG Humerus Right  Result Date: 08/05/2019 CLINICAL DATA:  Right arm pain secondary to a fall. EXAM: RIGHT HUMERUS - 2+ VIEW COMPARISON:  None FINDINGS: There is an impacted comminuted subluxed fracture of the right humeral head. The distal humerus is intact. IMPRESSION: Impacted comminuted subluxed fracture of the right humeral head. Electronically Signed   By: Lorriane Shire M.D.   On: 08/05/2019 09:20   DG Foot Complete Left  Result Date: 08/05/2019 CLINICAL DATA:  Pain secondary to a fall. EXAM: LEFT FOOT - COMPLETE 3+ VIEW COMPARISON:  None. FINDINGS: There is no evidence of fracture or dislocation. There is no evidence of arthropathy or other focal bone abnormality. Soft tissues are unremarkable. IMPRESSION: Negative. Electronically Signed   By: Lorriane Shire M.D.   On: 08/05/2019 09:19    Scheduled  Meds: . acidophilus  1 capsule Oral Daily  . amLODipine  10 mg Oral Daily  . citalopram  10 mg Oral Daily  . diltiazem  180 mg Oral Daily  . heparin  5,000 Units Subcutaneous Q8H  . insulin aspart  0-5 Units Subcutaneous QHS  . insulin aspart  0-9 Units Subcutaneous TID WC  . ipratropium-albuterol  3 mL Nebulization TID  . lacosamide  50 mg Oral BID  . losartan  25 mg Oral Daily  . mometasone-formoterol  2 puff Inhalation BID  . montelukast  10 mg Oral QHS  . Oxcarbazepine  150 mg Oral BID  . pantoprazole  40 mg Oral Daily  . potassium chloride  20 mEq Oral Daily  . potassium chloride  40 mEq Oral Once  . rosuvastatin  10 mg Oral Daily  . sodium chloride flush  3 mL Intravenous Q12H   Continuous Infusions: . sodium chloride 75 mL/hr at 08/05/19 1204   PRN Meds:acetaminophen **OR** acetaminophen, albuterol, docusate sodium, LORazepam, methocarbamol, metoprolol tartrate, morphine injection, ondansetron **OR** ondansetron (ZOFRAN) IV, oxyCODONE  Assessment/Plan:  Comminuted fracture of right humerussecondary to mechanical fall: Having extreme pain intermittently  - CT-head is negative for acute intracranial abnormalities.  CT of C-spine showed degenerative disc disease. -pain control -prn Robaxin -Orthopedic surgeon, Dr. Mack Guise was consulted.  Recommended for conservative management at this time but if no improvement may per from surgery in a week.  Patient is to follow-up with orthopedic surgery outpatient clinic in a week -Meanwhile immobilization with her shoulder immobilizer as per orthopedic surgery -Continue pain management -Continue PT OT as per orthopedic surgery direction  Acute hypokalemiaand hypomagnesemia/prolonged  -Status post replacement -Second a.m.  Seizure disorder (HCC)with witnessed seizure activity: pt has hx of seizure -Patient was loaded with Vimpat 50 mg twice daily per neurology    -continue home Trileptal -EEG on this admission is  negative -Seizure precaution -When necessary Ativan for seizure -Patient neurology recommendation -She is to follow-up with her neurologist Dr. Manuella Ghazi patient  Chronic diastolic heart failure ([place]): 2D echo on 07/13/2016 showed EF of 50-55% with grade 1 diastolic dysfunction.  BNP 56.  No significant leg edema, CHF is compensated. -hold Lasix while pt is receiving IV fluid; may resume  COPD (chronic obstructive pulmonary disease) (HCC)on chronic O2 -Bronchodilators, Singulair  Cirrhosis, non-alcoholic (HCC) -LFTs within normal limits  History of CVA  in adulthood -Continue preadmission statin --Continue to hold plavix for possible surgery in a week may complicate matters also due to recent fracture  DVT prophylaxis with Lovenox  Passed swallow evaluation and patient is on heart healthy diet  Disposition: Per physical and Occupational Therapy skilled nursing facility -Case manager  consulted   LOS: 1 day   Shahzain Kiester Izetta Dakin

## 2019-08-06 NOTE — Progress Notes (Signed)
Spoke with Sharion Settler NP regarding pt mews score a 3 and Heart rate. NP to place orders.

## 2019-08-07 LAB — GLUCOSE, CAPILLARY
Glucose-Capillary: 184 mg/dL — ABNORMAL HIGH (ref 70–99)
Glucose-Capillary: 176 mg/dL — ABNORMAL HIGH (ref 70–99)
Glucose-Capillary: 171 mg/dL — ABNORMAL HIGH (ref 70–99)
Glucose-Capillary: 174 mg/dL — ABNORMAL HIGH (ref 70–99)

## 2019-08-07 LAB — MAGNESIUM: Magnesium: 2 mg/dL (ref 1.7–2.4)

## 2019-08-07 LAB — POTASSIUM: Potassium: 3.8 mmol/L (ref 3.5–5.1)

## 2019-08-07 MED ORDER — DILTIAZEM HCL 30 MG PO TABS
30.0000 mg | ORAL_TABLET | Freq: Three times a day (TID) | ORAL | Status: DC
  Administered 2019-08-07 – 2019-08-08 (×4): 30 mg via ORAL
  Filled 2019-08-07 (×4): qty 1

## 2019-08-07 MED ORDER — DILTIAZEM HCL 30 MG PO TABS: 30.0000 mg | ORAL_TABLET | Freq: Three times a day (TID) | ORAL | Status: DC

## 2019-08-07 MED ORDER — AMLODIPINE BESYLATE 5 MG PO TABS
5.0000 mg | ORAL_TABLET | Freq: Every day | ORAL | Status: DC
  Administered 2019-08-07 – 2019-08-08 (×2): 5 mg via ORAL
  Filled 2019-08-07 (×2): qty 1

## 2019-08-07 NOTE — Progress Notes (Signed)
  Subjective:  Patient continues to complain of right shoulder pain.  She is using her right shoulder immobilizer.  Patient had a physical therapy session earlier today.  I have read reviewed the PT notes.  Objective:   VITALS:   Vitals:   08/07/19 1228 08/07/19 1424 08/07/19 1427 08/07/19 1653  BP: (!) 148/62 (!) 133/51 (!) 136/58 139/62  Pulse: 66 66 65 70  Resp:  _0 Temp:   97.6 F (36.4 C) 97.9 F (36.6 C)  TempSrc:      SpO2:  95% 96% 94%    PHYSICAL EXAM: Right upper extremity: Shoulder immobilizer is in place.  Skin is intact.  Patient can flex and extend her fingers and has intact sensation to light touch. Neurovascular intact Sensation intact distally No cellulitis present Compartment soft  LABS  Results for orders placed or performed during the hospital encounter of 08/05/19 (from the past 24 hour(s))  Glucose, capillary     Status: Abnormal   Collection Time: 08/06/19  9:20 PM  Result Value Ref Range   Glucose-Capillary 189 (H) 70 - 99 mg/dL  Glucose, capillary     Status: Abnormal   Collection Time: 08/07/19  7:55 AM  Result Value Ref Range   Glucose-Capillary 184 (H) 70 - 99 mg/dL  Potassium     Status: None   Collection Time: 08/07/19  8:00 AM  Result Value Ref Range   Potassium 3.8 3.5 - 5.1 mmol/L  Magnesium     Status: None   Collection Time: 08/07/19  8:00 AM  Result Value Ref Range   Magnesium 2.0 1.7 - 2.4 mg/dL  Glucose, capillary     Status: Abnormal   Collection Time: 08/07/19 11:59 AM  Result Value Ref Range   Glucose-Capillary 176 (H) 70 - 99 mg/dL  Glucose, capillary     Status: Abnormal   Collection Time: 08/07/19  4:50 PM  Result Value Ref Range   Glucose-Capillary 171 (H) 70 - 99 mg/dL    No results found.  Assessment/Plan:     Principal Problem:   Comminuted fracture of right humerus Active Problems:   Diabetes mellitus without complication (HCC)   Essential hypertension   Depression   Hypokalemia   Seizure  disorder (HCC)   Chronic diastolic heart failure (HCC)   COPD (chronic obstructive pulmonary disease) ([place])   Deaf   On home oxygen therapy   Cirrhosis, non-alcoholic ([place])   Fall   Hypomagnesemia   History of CVA in adulthood  Patient will continue immobilization for her right proximal humerus fracture.  Continue physical and occupational therapy.  Continue current pain control.  Patient may be discharged when cleared by medicine.  She will follow-up in our office in 1 to 2 weeks for reevaluation and x-ray.  Patient may perform flexion and extension of her digits wrist and elbow but should avoid active forward elevation or abduction of her shoulder.  Patient should not lift or weight-bear on the right upper extremity.    Thornton Park , MD 08/07/2019, 8:32 PM

## 2019-08-07 NOTE — Progress Notes (Signed)
Received order for metoprolol 74m one time dose for HR at 130. Patient's MEWS at 3. Will continue to monitor. Patient in no acute distress.

## 2019-08-07 NOTE — Progress Notes (Signed)
Informed by RN patient continued with sinus tachycardia rates 120's. Treated with metoprolol and was given SR cardizem at half her CD dose last night as tachycardia likely from missed cardizem dose yesterday afternoon.  She does remain in ST but stable pressures.

## 2019-08-07 NOTE — Progress Notes (Signed)
Brief narrative: 69 year old female with history of seizure-like episodes (also concern for nonepileptic events) presented with fall.  On exam was noted to have right upper extremity shaking movements briefly with eye movement fluctuations.   Subjective: Still having extreme right shoulder pain.  Denies any palpitation or chest pain.  Objective: Vital signs in last 24 hours: Temp:  [98.2 F (36.8 C)-100 F (37.8 C)] 98.2 F (36.8 C) (01/31 0820) Pulse Rate:  [58-134] 63 (01/31 0820) Resp:  [16-20] 18 (01/31 0820) BP: (135-169)/(61-94) 142/61 (01/31 0820) SpO2:  [91 %-96 %] 92 % (01/31 0820)  Intake/Output from previous day: 01/30 0701 - 01/31 0700 In: 483 [P.O.:480; I.V.:3] Out: -  Intake/Output this shift: No intake/output data recorded.  Constitutional: Mild to moderate distress as evidenced by ongoing right upper extremity pain  Eyes: PERRL, lids and conjunctivae normal ENMT: Mucous membranes are dry. Posterior pharynx clear of any exudate or lesions. Neck: normal, supple, no masses, no thyromegaly Respiratory: Anterior lung sounds are coarse to auscultation especially in left mid field.  2 L oxygen.  No tachypnea or increased work of breathing. Cardiovascular:  Tachycardic and regular rhythm, no murmurs / rubs / gallops. No extremity edema. 2+ pedal pulses. No carotid bruits.  Abdomen: no tenderness, no masses palpated. No hepatosplenomegaly. Bowel sounds positive.  Musculoskeletal:  Tender to any movement of right arm and shoulder.  Compromised range of motion of right shoulder joint.  No clubbing / cyanosis. No joint deformity upper and lower extremities. patient with notable complaints of knee pain with palpation-no effusion noted, no contractures. Normal muscle tone.  Skin: no rashes, lesions, ulcers. No induration -patient does have bruising of all the fingers of the left hand and of the left elbow, she also has bilateral knee abrasions greater on the right. Neurologic: CN  2-12 grossly intact. Sensation intact, DTR normal. Strength 5/5 x all 4 extremities.  Psychiatric: Appears to have normal judgment and insight but exam limited by inability to utilize sign language translator    Results for orders placed or performed during the hospital encounter of 08/05/19 (from the past 24 hour(s))  Glucose, capillary     Status: Abnormal   Collection Time: 08/06/19 12:17 PM  Result Value Ref Range   Glucose-Capillary 188 (H) 70 - 99 mg/dL  Glucose, capillary     Status: Abnormal   Collection Time: 08/06/19  4:50 PM  Result Value Ref Range   Glucose-Capillary 163 (H) 70 - 99 mg/dL  Glucose, capillary     Status: Abnormal   Collection Time: 08/06/19  9:20 PM  Result Value Ref Range   Glucose-Capillary 189 (H) 70 - 99 mg/dL  Glucose, capillary     Status: Abnormal   Collection Time: 08/07/19  7:55 AM  Result Value Ref Range   Glucose-Capillary 184 (H) 70 - 99 mg/dL  Potassium     Status: None   Collection Time: 08/07/19  8:00 AM  Result Value Ref Range   Potassium 3.8 3.5 - 5.1 mmol/L  Magnesium     Status: None   Collection Time: 08/07/19  8:00 AM  Result Value Ref Range   Magnesium 2.0 1.7 - 2.4 mg/dL  Glucose, capillary     Status: Abnormal   Collection Time: 08/07/19 11:59 AM  Result Value Ref Range   Glucose-Capillary 176 (H) 70 - 99 mg/dL    Studies/Results: EEG  Result Date: 08/05/2019 Lora Havens, MD     08/05/2019  1:52 PM Patient Name: MORANDA BILLIOT MRN:  [id number] Epilepsy Attending: Lora Havens Referring Physician/Provider: Dr Leotis Pain Date: 08/05/2019 Duration: 26.07 minutes Patient history: 69 year old female with history of seizure-like episodes (also concern for nonepileptic events) presented with fall.  On exam was noted to have right upper extremity shaking movements briefly with eye movement fluctuations.  EEG to evaluate for seizures. Level of alertness: Awake, asleep AEDs during EEG study: Oxcarbazepine, Vimpat Technical  aspects: This EEG study was done with scalp electrodes positioned according to the 10-20 International system of electrode placement. Electrical activity was acquired at a sampling rate of 500Hz and reviewed with a high frequency filter of 70Hz and a low frequency filter of 1Hz. EEG data were recorded continuously and digitally stored. Description: The posterior dominant rhythm consists of 8 Hz activity of moderate voltage (25-35 uV) seen predominantly in posterior head regions, symmetric and reactive to eye opening and eye closing.  Sleep was characterized by sleep spindles (12 to 14 Hz), maximal frontocentral.  Physiologic photic driving was not seen during photic stimulation.  Hyperventilation was not performed.       IMPRESSION: This study is within normal limits. No seizures or epileptiform discharges were seen throughout the recording. Lora Havens   DG Chest 1 View  Result Date: 08/05/2019 CLINICAL DATA:  Trauma secondary to a fall. Right arm pain. Head trauma. EXAM: CHEST  1 VIEW COMPARISON:  06/18/2019 FINDINGS: Heart size and pulmonary vascularity are normal and the lungs are clear. Aortic atherosclerosis. New comminuted impacted subluxed fracture of the right humeral head. No other significant bone abnormality. IMPRESSION: 1. No acute cardiopulmonary disease. 2. New comminuted impacted subluxed fracture of the right humeral head. Electronically Signed   By: Lorriane Shire M.D.   On: 08/05/2019 09:17   DG Pelvis 1-2 Views  Result Date: 08/05/2019 CLINICAL DATA:  Multiple trauma secondary to a fall. EXAM: PELVIS - 1-2 VIEW COMPARISON:  05/15/2012 FINDINGS: There is no evidence of pelvic fracture or diastasis. No pelvic bone lesions are seen. IMPRESSION: Normal exam. Electronically Signed   By: Lorriane Shire M.D.   On: 08/05/2019 09:21   CT Head Wo Contrast  Result Date: 08/05/2019 CLINICAL DATA:  Status post fall with a blow to the head. Initial encounter. EXAM: CT HEAD WITHOUT CONTRAST CT  CERVICAL SPINE WITHOUT CONTRAST TECHNIQUE: Multidetector CT imaging of the head and cervical spine was performed following the standard protocol without intravenous contrast. Multiplanar CT image reconstructions of the cervical spine were also generated. COMPARISON:  Head and cervical spine CT scans 06/15/2017. FINDINGS: CT HEAD FINDINGS Brain: No evidence of acute infarction, hemorrhage, hydrocephalus, extra-axial collection or mass lesion/mass effect. Vascular: No hyperdense vessel or unexpected calcification. Skull: Intact.  No focal lesion. Sinuses/Orbits: Status post cataract surgery.  Otherwise negative. Other: None. CT CERVICAL SPINE FINDINGS Alignment: Maintained. Skull base and vertebrae: No acute fracture. No primary bone lesion or focal pathologic process. Soft tissues and spinal canal: No prevertebral fluid or swelling. No visible canal hematoma. Disc levels: Loss of disc space height and mild endplate spurring are seen at C5-6. Upper chest: Lung apices clear. Other: None. IMPRESSION: No acute abnormality head or cervical spine. Degenerative disc disease C5-6. Electronically Signed   By: Inge Rise M.D.   On: 08/05/2019 08:50   CT Cervical Spine Wo Contrast  Result Date: 08/05/2019 CLINICAL DATA:  Status post fall with a blow to the head. Initial encounter. EXAM: CT HEAD WITHOUT CONTRAST CT CERVICAL SPINE WITHOUT CONTRAST TECHNIQUE: Multidetector CT imaging of the head and cervical  spine was performed following the standard protocol without intravenous contrast. Multiplanar CT image reconstructions of the cervical spine were also generated. COMPARISON:  Head and cervical spine CT scans 06/15/2017. FINDINGS: CT HEAD FINDINGS Brain: No evidence of acute infarction, hemorrhage, hydrocephalus, extra-axial collection or mass lesion/mass effect. Vascular: No hyperdense vessel or unexpected calcification. Skull: Intact.  No focal lesion. Sinuses/Orbits: Status post cataract surgery.  Otherwise  negative. Other: None. CT CERVICAL SPINE FINDINGS Alignment: Maintained. Skull base and vertebrae: No acute fracture. No primary bone lesion or focal pathologic process. Soft tissues and spinal canal: No prevertebral fluid or swelling. No visible canal hematoma. Disc levels: Loss of disc space height and mild endplate spurring are seen at C5-6. Upper chest: Lung apices clear. Other: None. IMPRESSION: No acute abnormality head or cervical spine. Degenerative disc disease C5-6. Electronically Signed   By: Inge Rise M.D.   On: 08/05/2019 08:50   CT SHOULDER RIGHT WO CONTRAST  Result Date: 08/05/2019 CLINICAL DATA:  [place]. Evaluate right humeral head and neck fractures. EXAM: CT OF THE UPPER RIGHT EXTREMITY WITHOUT CONTRAST TECHNIQUE: Multidetector CT imaging of the upper right extremity was performed according to the standard protocol. COMPARISON:  Radiographs 08/05/2019 FINDINGS: Complex comminuted fractures of the humeral head and neck are noted. There is a comminuted and impacted transverse fracture through the humeral neck and comminuted and displaced fractures of the greater and lesser tuberosities. The humeral head is normally located in the glenoid fossa. Moderate shortening is noted with increased humeroacromial distance. [facility] joint is intact. Os acromial noted incidentally. No clavicle or scapular fracture. The visualized right ribs are intact. Grossly by CT the rotator cuff tendons are intact. The visualized lungs are clear. IMPRESSION: 1. Complex comminuted fractures of the humeral head and neck as described above. 2. Grossly intact rotator cuff tendons. 3. Intact AC joint.  Incidental os acromial. Electronically Signed   By: Marijo Sanes M.D.   On: 08/05/2019 14:19   DG Knee Complete 4 Views Right  Result Date: 08/05/2019 CLINICAL DATA:  Mechanical fall, no loss consciousness EXAM: RIGHT KNEE - COMPLETE 4+ VIEW COMPARISON:  None. FINDINGS: No evidence of fracture, dislocation, or joint  effusion. No evidence of arthropathy or other focal bone abnormality. Soft tissues are unremarkable. IMPRESSION: No acute osseous injury of the right knee. Electronically Signed   By: Kathreen Devoid   On: 08/05/2019 11:06   DG Humerus Right  Result Date: 08/05/2019 CLINICAL DATA:  Right arm pain secondary to a fall. EXAM: RIGHT HUMERUS - 2+ VIEW COMPARISON:  None FINDINGS: There is an impacted comminuted subluxed fracture of the right humeral head. The distal humerus is intact. IMPRESSION: Impacted comminuted subluxed fracture of the right humeral head. Electronically Signed   By: Lorriane Shire M.D.   On: 08/05/2019 09:20   DG Foot Complete Left  Result Date: 08/05/2019 CLINICAL DATA:  Pain secondary to a fall. EXAM: LEFT FOOT - COMPLETE 3+ VIEW COMPARISON:  None. FINDINGS: There is no evidence of fracture or dislocation. There is no evidence of arthropathy or other focal bone abnormality. Soft tissues are unremarkable. IMPRESSION: Negative. Electronically Signed   By: Lorriane Shire M.D.   On: 08/05/2019 09:19    Scheduled Meds: . acidophilus  1 capsule Oral Daily  . amLODipine  5 mg Oral Daily  . citalopram  10 mg Oral Daily  . diltiazem  30 mg Oral Q8H  . heparin  5,000 Units Subcutaneous Q8H  . insulin aspart  0-5 Units Subcutaneous  QHS  . insulin aspart  0-9 Units Subcutaneous TID WC  . ipratropium-albuterol  3 mL Nebulization TID  . lacosamide  50 mg Oral BID  . losartan  25 mg Oral Daily  . mometasone-formoterol  2 puff Inhalation BID  . montelukast  10 mg Oral QHS  . Oxcarbazepine  150 mg Oral BID  . pantoprazole  40 mg Oral Daily  . potassium chloride  20 mEq Oral Daily  . potassium chloride  40 mEq Oral Once  . rosuvastatin  10 mg Oral Daily  . sodium chloride flush  3 mL Intravenous Q12H   Continuous Infusions:  PRN Meds:acetaminophen **OR** acetaminophen, albuterol, docusate sodium, LORazepam, methocarbamol, metoprolol tartrate, morphine injection, ondansetron **OR**  ondansetron (ZOFRAN) IV, oxyCODONE  Assessment/Plan:  Comminuted fracture of right humerussecondary to mechanical fall: Still having extreme pain intermittently  -Per imaging - CT-head is negative for acute intracranial abnormalities.  CT of C-spine showed degenerative disc disease. -pain control -prn Robaxin -Orthopedic surgeon, Dr. Mack Guise was consulted.  Recommended for conservative management at this time but if no improvement may perform surgery in a week.  Patient is to follow-up with orthopedic surgery outpatient clinic in a week -Meanwhile immobilization with her shoulder immobilizer as per orthopedic surgery -Continue pain management -Continue PT OT as per orthopedic surgery direction  Acute hypokalemiaand hypomagnesemia/prolonged  -Resolved now -Status post replacement -Recheck  Seizure disorder (HCC)with witnessed seizure activity: pt has hx of seizure but no new episode -Patient was loaded with Vimpat 50 mg twice daily per neurology    -continue home Trileptal -EEG on this admission is negative -Seizure precaution -When necessary Ativan for seizure -Appreciate neurology recommendation -She is to follow-up with her neurologist Dr. Manuella Ghazi patient  Chronic diastolic heart failure ([place]): No new/acute issues - 2D echo on 07/13/2016 showed EF of 50-55% with grade 1 diastolic dysfunction.  BNP 57.  No significant leg edema, CHF is compensated. -hold Lasix while pt is receiving IV fluid; may resume  COPD (chronic obstructive pulmonary disease) (HCC)on chronic O2 -Bronchodilators, Singulair  Cirrhosis, non-alcoholic (HCC) -LFTs within normal limits  History of CVA in adulthood -Continue preadmission statin --Continue to hold plavix for possible surgery in a week may complicate matters also due to recent fracture  DVT prophylaxis with Lovenox  Passed swallow evaluation and patient is on heart healthy diet  Disposition: Per physical and Occupational Therapy  skilled nursing facility -Case manager  Consulted; working on placement   LOS: 2 days   [employer]

## 2019-08-07 NOTE — Progress Notes (Signed)
Notified by tele pt had a run of SVT. Vitals taken. MD Izetta Dakin notified. MD ordered EKG.

## 2019-08-07 NOTE — Progress Notes (Signed)
Physical Therapy Treatment Patient Details Name: [name] MRN: [id number] DOB: [birth year] Today's Date: 08/07/2019    History of Present Illness 69 yo Female presents to hospital after sustaining a fall outside her home. CT scan of RUE shows comminuted closed humeral head fracture. Ortho consult recommended RUE immobilizer and will re-assess in a week to determine if surgery is needed. She will need to wear immobilizer at all times unless bathing/dressing, elbow ROM is permitted; Neuro consulted for history of seizures. Medication updated and recommend patient follow up with outpatient neurology. PMH significant HTN,HLD, DM COPD on 2L O2 at home, Asthma, past CVA, GERD, depression ,seizures, non-alcohol cirrhosis, CHF, smoker; Patient is deaf and will require an [place] interpreter.    PT Comments    ASL interpreter scheduled yesterday for today at 11:00, let pt know (via written word) that we were waiting for one.  By 11:20 no one had shown up, via note asked pt if she would like to work with PT and utilize available communication options sans interpreter, she agreed and we proceeded with PT session.  Pt showed good effort but clearly in pain with any acts as well as pt being quick to fatigue.  She showed good overall effort and was able to stand and transfer and then after rest break stood again and was able to slowly, but safely ambulate ~12 ft.  Pt very fatigued with the effort (sats dropped to 88% on 3L, HR in the 90s) and requests to sit. Pt did not have any overt safety issues, utilized the hemiwalker well and overall showed good effort despite pain and fatigue.    Follow Up Recommendations  SNF;Supervision/Assistance - 24 hour     Equipment Recommendations  (hemiwalker)    Recommendations for Other Services       Precautions / Restrictions Precautions Precautions: Fall Restrictions RUE Weight Bearing: Non weight bearing    Mobility  Bed Mobility Overal bed mobility: Needs  Assistance Bed Mobility: Supine to Sit     Supine to sit: Mod assist        Transfers Overall transfer level: Needs assistance Equipment used: Hemi-walker Transfers: Sit to/from Stand Sit to Stand: Min assist         General transfer comment: multiple sit to stand efforts today, pt needed light assist with each one, but generally showed good effort and confidence  Ambulation/Gait Ambulation/Gait assistance: Min assist Gait Distance (Feet): 12 Feet Assistive device: Hemi-walker       General Gait Details: Pt with very hesitant, slow steps, but did not have any overt LOBs or safety issues.  Pt with shortness of breath, sats dropped to 88% on 3L during the effort.    Stairs             Wheelchair Mobility    Modified Rankin (Stroke Patients Only)       Balance Overall balance assessment: Needs assistance Sitting-balance support: Single extremity supported;Feet supported Sitting balance-Leahy Scale: Good Sitting balance - Comments: Pt able to maintain single UE supported sitting well   Standing balance support: Single extremity supported Standing balance-Leahy Scale: Fair Standing balance comment: Pt was able to maintain static standing relatively well with HW, decreased balance/stability with dynamic acts                            Cognition Arousal/Alertness: Awake/alert Behavior During Therapy: WFL for tasks assessed/performed Overall Cognitive Status: Within Functional Limits for tasks assessed  Exercises      General Comments        Pertinent Vitals/Pain Pain Assessment: 0-10 Pain Score: 10-Worst pain ever Pain Location: RUE shoulder    Home Living                      Prior Function            PT Goals (current goals can now be found in the care plan section) Progress towards PT goals: Progressing toward goals    Frequency    7X/week      PT Plan  Current plan remains appropriate    Co-evaluation              AM-PAC PT "6 Clicks" Mobility   Outcome Measure  Help needed turning from your back to your side while in a flat bed without using bedrails?: A Little Help needed moving from lying on your back to sitting on the side of a flat bed without using bedrails?: A Little Help needed moving to and from a bed to a chair (including a wheelchair)?: A Little Help needed standing up from a chair using your arms (e.g., wheelchair or bedside chair)?: A Little Help needed to walk in hospital room?: A Lot Help needed climbing 3-5 steps with a railing? : Total 6 Click Score: 15    End of Session Equipment Utilized During Treatment: Gait belt;Oxygen;Other (comment)(immob) Activity Tolerance: Patient limited by fatigue Patient left: with chair alarm set;with call bell/phone within reach;with nursing/sitter in room Nurse Communication: Mobility status PT Visit Diagnosis: Muscle weakness (generalized) (M62.81);Difficulty in walking, not elsewhere classified (R26.2)     Time: 3794-3276 PT Time Calculation (min) (ACUTE ONLY): 26 min  Charges:  $Gait Training: 8-22 mins $Therapeutic Activity: 8-22 mins                     Kreg Shropshire, DPT 08/07/2019, 1:34 PM

## 2019-08-08 LAB — URINE CULTURE

## 2019-08-08 LAB — BASIC METABOLIC PANEL
Anion gap: 10 (ref 5–15)
BUN: 12 mg/dL (ref 8–23)
CO2: 26 mmol/L (ref 22–32)
Calcium: 8.5 mg/dL — ABNORMAL LOW (ref 8.9–10.3)
Chloride: 99 mmol/L (ref 98–111)
Creatinine, Ser: 0.52 mg/dL (ref 0.44–1.00)
GFR calc Af Amer: >60 mL/min (ref >60)
GFR calc non Af Amer: >60 mL/min (ref >60)
Glucose, Bld: 309 mg/dL — ABNORMAL HIGH (ref 70–99)
Potassium: 3.5 mmol/L (ref 3.5–5.1)
Sodium: 135 mmol/L (ref 135–145)

## 2019-08-08 LAB — GLUCOSE, CAPILLARY
Glucose-Capillary: 165 mg/dL — ABNORMAL HIGH (ref 70–99)
Glucose-Capillary: 306 mg/dL — ABNORMAL HIGH (ref 70–99)
Glucose-Capillary: 261 mg/dL — ABNORMAL HIGH (ref 70–99)

## 2019-08-08 LAB — MAGNESIUM: Magnesium: 1.8 mg/dL (ref 1.7–2.4)

## 2019-08-08 MED ORDER — POTASSIUM CHLORIDE ER 10 MEQ PO TBCR: 20.0000 meq | EXTENDED_RELEASE_TABLET | Freq: Every day | ORAL | 0 refills | Status: DC

## 2019-08-08 MED ORDER — METOPROLOL SUCCINATE ER 25 MG PO TB24
25.0000 mg | ORAL_TABLET | Freq: Every day | ORAL | Status: DC
  Administered 2019-08-08: 25 mg via ORAL
  Filled 2019-08-08: qty 1

## 2019-08-08 MED ORDER — METOPROLOL SUCCINATE ER 25 MG PO TB24: 25.0000 mg | ORAL_TABLET | Freq: Every day | ORAL | 0 refills | Status: DC

## 2019-08-08 MED ORDER — LACOSAMIDE 50 MG PO TABS: 50.0000 mg | ORAL_TABLET | Freq: Two times a day (BID) | ORAL | 0 refills | Status: DC

## 2019-08-08 MED ORDER — POLYETHYLENE GLYCOL 3350 17 G PO PACK: 17.0000 g | PACK | Freq: Every day | ORAL | 0 refills | Status: DC | PRN

## 2019-08-08 MED ORDER — METHOCARBAMOL 500 MG PO TABS: 500.0000 mg | ORAL_TABLET | Freq: Three times a day (TID) | ORAL | 0 refills | Status: DC | PRN

## 2019-08-08 MED ORDER — ACETAMINOPHEN 325 MG PO TABS: 650.0000 mg | ORAL_TABLET | Freq: Four times a day (QID) | ORAL | 1 refills | Status: DC | PRN

## 2019-08-08 MED ORDER — DILTIAZEM HCL ER COATED BEADS 120 MG PO CP24
120.0000 mg | ORAL_CAPSULE | Freq: Every day | ORAL | Status: DC
  Filled 2019-08-08: qty 1

## 2019-08-08 MED ORDER — OXYCODONE HCL 5 MG PO TABS: 5.0000 mg | ORAL_TABLET | Freq: Four times a day (QID) | ORAL | 0 refills | Status: DC | PRN

## 2019-08-08 NOTE — Progress Notes (Signed)
Occupational Therapy Treatment Patient Details Name: [name] MRN: [id number] DOB: [birth year] Today's Date: 08/08/2019    History of present illness 69 yo Female presents to hospital after sustaining a fall outside her home. CT scan of RUE shows comminuted closed humeral head fracture. Ortho consult recommended RUE immobilizer and will re-assess in a week to determine if surgery is needed. She will need to wear immobilizer at all times unless bathing/dressing, elbow ROM is permitted; Neuro consulted for history of seizures. Medication updated and recommend patient follow up with outpatient neurology. PMH significant HTN,HLD, DM COPD on 2L O2 at home, Asthma, past CVA, GERD, depression ,seizures, non-alcohol cirrhosis, CHF, smoker; Patient is deaf and will require an [place] interpreter.   OT comments  Pt seen for OT/PT co-tx this date. ASL interpretor utilized throughout session Paul Half). Pt recently back to bed from using [facility] with nurse tech and promptly requests to use BSC again. Pt reported 10/10 R shoulder pain, RN notified and in room to administer medication. Pt on room air initially, spO2 reading 81%. Placed on 3 to 6L session to maintain saturation levels with mobility (86-88% sitting on BSC). Mod A for supine > sit EOB with this therapist. Min A from low bed for STS transfer and CGA in standing for SPT to BSC. CGA for STS from [facility] requiring Max A for pericare. Placed on 3 to 6L session to maintain saturation levels with mobility (86-88% sitting on BSC). Pt instructed in PLB to support breath recovery. Further ambulation held due to patient fatigue, pain, and oxygen levels. Pt returned to bed and PT able to slowly decrease oxygen to 3L with rest, 90-91% at end of session.    Follow Up Recommendations  SNF    Equipment Recommendations  3 in 1 bedside commode    Recommendations for Other Services      Precautions / Restrictions Precautions Precautions: Fall Precaution Comments: watch  02 Required Braces or Orthoses: Other Brace Other Brace: RUE shoulder immobilizer Restrictions Weight Bearing Restrictions: Yes RUE Weight Bearing: Non weight bearing Other Position/Activity Restrictions: no shoulder ROM especially abduction, elbow/wrist/digit ROM to tolerance, immobilizer at all times except for bathing/dressing       Mobility Bed Mobility Overal bed mobility: Needs Assistance Bed Mobility: Supine to Sit   Sidelying to sit: Mod assist;+2 for physical assistance Supine to sit: Mod assist Sit to supine: Min assist   General bed mobility comments: Physical assist for LE assistance, trunk control  Transfers Overall transfer level: Needs assistance Equipment used: 1 person hand held assist Transfers: Stand Pivot Transfers;Sit to/from Stand Sit to Stand: Min assist Stand pivot transfers: Min guard       General transfer comment: Several sit to stands performed this session. From standard/low bed height, minA. Elevated BSC handheld assist/CGA. Returned to sitting EOB CGA.    Balance Overall balance assessment: Needs assistance Sitting-balance support: Single extremity supported;Feet supported Sitting balance-Leahy Scale: Good Sitting balance - Comments: Pt able to maintain single UE supported sitting well   Standing balance support: Single extremity supported Standing balance-Leahy Scale: Fair Standing balance comment: Pt was able to maintain static standing relatively well with HW, decreased balance/stability with dynamic acts                           ADL either performed or assessed with clinical judgement   ADL Overall ADL's : Needs assistance/impaired  Toilet Transfer: Min Conservation officer, nature and Hygiene: Maximal assistance Toileting - Clothing Manipulation Details (indicate cue type and reason): Max A for pericare after BM while pt in standing with PT for stability      Functional mobility during ADLs: Min guard       Vision Baseline Vision/History: Wears glasses Wears Glasses: Reading only Patient Visual Report: No change from baseline     Perception     Praxis      Cognition Arousal/Alertness: Awake/alert Behavior During Therapy: WFL for tasks assessed/performed Overall Cognitive Status: Within Functional Limits for tasks assessed                                          Exercises Other Exercises Other Exercises: Instructed in PLB during functional activities and at rest to address spO2 desaturation Other Exercises: Pt able to assist with scooting towards HOB, minAx2 during session Other Exercises: assist for ordering lunch with menu provided, interpreter, and therapist relaying preferences to dining over the phone   Shoulder Instructions       General Comments      Pertinent Vitals/ Pain       Pain Assessment: 0-10 Pain Score: 10-Worst pain ever Pain Location: RUE shoulder, per interpreter Pain Descriptors / Indicators: Sore;Grimacing;Moaning Pain Intervention(s): Limited activity within patient's tolerance;Monitored during session;Patient requesting pain meds-RN notified  Home Living                                          Prior Functioning/Environment              Frequency  Min 2X/week        Progress Toward Goals  OT Goals(current goals can now be found in the care plan section)  Progress towards OT goals: Progressing toward goals  Acute Rehab OT Goals Patient Stated Goal: to go home and get better OT Goal Formulation: With patient Time For Goal Achievement: 08/20/19 Potential to Achieve Goals: Good  Plan Discharge plan remains appropriate;Frequency remains appropriate    Co-evaluation    PT/OT/SLP Co-Evaluation/Treatment: Yes Reason for Co-Treatment: Complexity of the patient's impairments (multi-system involvement);For patient/therapist safety;To address  functional/ADL transfers;Necessary to address cognition/behavior during functional activity PT goals addressed during session: Mobility/safety with mobility;Balance;Proper use of DME OT goals addressed during session: ADL's and self-care;Proper use of Adaptive equipment and DME      AM-PAC OT "6 Clicks" Daily Activity     Outcome Measure   Help from another person eating meals?: A Little Help from another person taking care of personal grooming?: A Lot Help from another person toileting, which includes using toliet, bedpan, or urinal?: A Lot Help from another person bathing (including washing, rinsing, drying)?: A Lot Help from another person to put on and taking off regular upper body clothing?: A Lot Help from another person to put on and taking off regular lower body clothing?: A Lot 6 Click Score: 13    End of Session Equipment Utilized During Treatment: Gait belt  OT Visit Diagnosis: Other abnormalities of gait and mobility (R26.89);Muscle weakness (generalized) (M62.81);Pain Pain - Right/Left: Right Pain - part of body: Shoulder   Activity Tolerance Patient tolerated treatment well   Patient Left in bed;with call bell/phone within reach;with bed alarm set   Nurse  Communication Other (comment);Patient requests pain meds(O2 sats during session)        Time: 4239-5320 OT Time Calculation (min): 30 min  Charges: OT General Charges $OT Visit: 1 Visit OT Treatments $Self Care/Home Management : 8-22 mins  Jeni Salles, MPH, MS, OTR/L ascom [phone] 08/08/19, 12:02 PM

## 2019-08-08 NOTE — Care Management Important Message (Signed)
Important Message  Patient Details  Name: [name] MRN: [id number] Date of Birth: Mar 21, 1951   Medicare Important Message Given:  Yes  Initial important message signed.   [name] 08/08/2019, 11:41 AM

## 2019-08-08 NOTE — Progress Notes (Signed)
Physical Therapy Treatment Patient Details Name: ISYS TIETJE MRN: [id number] DOB: 1950-12-19 Today's Date: 08/08/2019    History of Present Illness 69 yo Female presents to hospital after sustaining a fall outside her home. CT scan of RUE shows comminuted closed humeral head fracture. Ortho consult recommended RUE immobilizer and will re-assess in a week to determine if surgery is needed. She will need to wear immobilizer at all times unless bathing/dressing, elbow ROM is permitted; Neuro consulted for history of seizures. Medication updated and recommend patient follow up with outpatient neurology. PMH significant HTN,HLD, DM COPD on 2L O2 at home, Asthma, past CVA, GERD, depression ,seizures, non-alcohol cirrhosis, CHF, smoker; Patient is deaf and will require an [place] interpreter.    PT Comments    Patient had just finished transferring from [facility] to bed at start of session with CNA, ASL interpretor utilized throughout session Paul Half). Pt reported 10/10 R shoulder pain, RN notified and in room to administer medication. Pt on room air initially, spO2 reading 81%. Placed on 3 to 6L session to maintain saturation levels with mobility (86-88% sitting on BSC). Bed mobility performed with modA, sit <> stand with handheld assist min-CGA this session. Further ambulation held due to patient fatigue, pain, and oxygen levels. Pt returned to bed and PT able to slowly decrease oxygen to 3L with rest, 90-91% at end of session. The patient would benefit from further skilled PT intervention to maximize mobility, safety, and independence, current recommendation remains appropriate.     Follow Up Recommendations  SNF;Supervision/Assistance - 24 hour     Equipment Recommendations  Other (comment)(hemi walker)    Recommendations for Other Services       Precautions / Restrictions Precautions Precautions: Fall Precaution Comments: watch )2 Required Braces or Orthoses: Other Brace Other Brace: RUE  shoulder immobilizer Restrictions Weight Bearing Restrictions: Yes RUE Weight Bearing: Non weight bearing Other Position/Activity Restrictions: no shoulder ROM especially abduction, elbow/wrist/digit ROM to tolerance, immobilizer at all times except for bathing/dressing    Mobility  Bed Mobility Overal bed mobility: Needs Assistance Bed Mobility: Supine to Sit     Supine to sit: Mod assist Sit to supine: Min assist   General bed mobility comments: Physical assist for LE assistance, trunk control  Transfers Overall transfer level: Needs assistance Equipment used: 1 person hand held assist Transfers: Stand Pivot Transfers;Sit to/from Stand Sit to Stand: Min assist Stand pivot transfers: Min guard       General transfer comment: Several sit to stands performed this session. From standard/low bed height, minA. Elevated BSC handheld assist/CGA. Returned to sitting EOB CGA.  Ambulation/Gait Ambulation/Gait assistance: Min guard Gait Distance (Feet): 2 Feet Assistive device: 1 person hand held assist       General Gait Details: Ambulation limited this session due to desaturation. on 6L ~86% sitting on BSC.   Stairs             Wheelchair Mobility    Modified Rankin (Stroke Patients Only)       Balance Overall balance assessment: Needs assistance Sitting-balance support: Single extremity supported;Feet supported Sitting balance-Leahy Scale: Good Sitting balance - Comments: Pt able to maintain single UE supported sitting well   Standing balance support: Single extremity supported Standing balance-Leahy Scale: Fair                              Cognition Arousal/Alertness: Awake/alert Behavior During Therapy: WFL for tasks assessed/performed Overall Cognitive Status: Within  Functional Limits for tasks assessed                                        Exercises Other Exercises Other Exercises: Instructed in PLB during functional  activities and at rest to address spO2 desaturation Other Exercises: Pt able to assist with scooting towards HOB, minAx2 during session    General Comments        Pertinent Vitals/Pain Pain Assessment: 0-10 Pain Score: 10-Worst pain ever Pain Location: RUE shoulder Pain Descriptors / Indicators: Sore;Grimacing;Moaning Pain Intervention(s): Monitored during session;RN gave pain meds during session;Limited activity within patient's tolerance;Repositioned    Home Living                      Prior Function            PT Goals (current goals can now be found in the care plan section) Progress towards PT goals: Progressing toward goals(slowly)    Frequency    7X/week      PT Plan Current plan remains appropriate    Co-evaluation PT/OT/SLP Co-Evaluation/Treatment: Yes Reason for Co-Treatment: Complexity of the patient's impairments (multi-system involvement);For patient/therapist safety;To address functional/ADL transfers;Necessary to address cognition/behavior during functional activity PT goals addressed during session: Mobility/safety with mobility;Balance;Proper use of DME OT goals addressed during session: ADL's and self-care;Proper use of Adaptive equipment and DME      AM-PAC PT "6 Clicks" Mobility   Outcome Measure  Help needed turning from your back to your side while in a flat bed without using bedrails?: A Little Help needed moving from lying on your back to sitting on the side of a flat bed without using bedrails?: A Little Help needed moving to and from a bed to a chair (including a wheelchair)?: A Little Help needed standing up from a chair using your arms (e.g., wheelchair or bedside chair)?: A Little Help needed to walk in hospital room?: A Lot Help needed climbing 3-5 steps with a railing? : Total 6 Click Score: 15    End of Session Equipment Utilized During Treatment: Gait belt;Oxygen;Other (comment)(room air - 6L, placed on 3L at end of  session) Activity Tolerance: Treatment limited secondary to medical complications (Comment);Patient limited by pain;Patient limited by fatigue(limited by fatigue, pain, and desaturation with functional activities) Patient left: in bed;with bed alarm set;with call bell/phone within reach Nurse Communication: Mobility status PT Visit Diagnosis: Muscle weakness (generalized) (M62.81);Difficulty in walking, not elsewhere classified (R26.2)     Time: 9689-5702 PT Time Calculation (min) (ACUTE ONLY): 30 min  Charges:  $Therapeutic Activity: 8-22 mins                     Lieutenant Diego PT, DPT 11:52 AM,08/08/19

## 2019-08-08 NOTE — Discharge Instructions (Signed)
Distal Humerus Elbow Fracture  A distal humerus elbow fracture is a break in the upper arm bone (humerus). The break happens in the lower (distal) part of the humerus, near the elbow. What are the causes? This condition may be caused by:  A hard, direct hit. This may happen as a result of being hit with an object, being in a motor vehicle accident, or having a collision with another person.  Falling onto an outstretched arm. What increases the risk? You are more likely to develop this condition if:  You are young.  You are elderly and have low bone density(osteoporosis).  You participate in activities where you are likely to have an injury or a fall, such as: ? Gymnastics. ? Football and other contact sports. ? Skateboarding. ? Biking. What are the signs or symptoms? Symptoms of this condition include:  Pain that is sudden and severe.  Tenderness of the elbow as well as the area near the elbow.  Inability to move the elbow or straighten the arm.  Swelling of the elbow.  Bruising.  Stiffness.  A feeling that the elbow is unstable. In severe cases, the bone can protrude through the skin. This type of elbow fracture is a medical emergency. How is this diagnosed? This condition is diagnosed based on your symptoms, your medical history, and a physical exam. You may also have X-rays to confirm the diagnosis. How is this treated? This condition may be treated by:  Wearing a splint or cast and a sling to hold your elbow still (immobilized) while it heals.  Applying ice to your elbow to relieve pain and reduce swelling.  Taking NSAIDs, such as ibuprofen, to reduce pain and swelling.  Doing exercises to help you regain movement (physical therapy).  Having surgery if the bones in your elbow are out of position. Follow these instructions at home: Medicines  Take over-the-counter and prescription medicines only as told by your health care provider.  Ask your health care  provider if the medicine prescribed to you: ? Requires you to avoid driving or using heavy machinery. ? Can cause constipation. You may need to take these actions to prevent or treat constipation:  Drink enough fluid to keep your urine pale yellow.  Take over-the-counter or prescription medicines.  Eat foods that are high in fiber, such as beans, whole grains, and fresh fruits and vegetables.  Limit foods that are high in fat and processed sugars, such as fried or sweet foods. If you have a cast or splint:  Do not put pressure on any part of the cast or splint until it is fully hardened. This may take several hours.  Check the skin around it every day. Tell your health care provider about any concerns. If you have a cast:  Do not stick anything inside it to scratch your skin. Doing that increases your risk of infection.  You may put lotion on dry skin around the edges of the cast. Do not put lotion on the skin underneath it.  Keep it clean and dry. If you have a splint or sling:  Wear it as told by your health care provider. Remove it only as told by your health care provider.  Loosen it if your fingers tingle, become numb, or turn cold and blue.  Keep it clean and dry. Bathing  Do not take baths, swim, or use a hot tub until your health care provider approves. Ask your health care provider if you may take showers. You may only  be allowed to take sponge baths.  If the cast, splint, or sling is not waterproof: ? Do not let it get wet. ? Cover it with a waterproof covering when you take a bath or shower. Managing pain, stiffness, and swelling   If directed, put ice on the injured area. ? If you have a removable splint or sling, remove it as told by your health care provider. ? Put ice in a plastic bag. ? Place a towel between your skin and the bag or between your cast and the bag. ? Leave the ice on for 20 minutes, 2-3 times a day.  Move your fingers often to reduce  stiffness and swelling.  Raise (elevate) the injured area above the level of your heart while you are sitting or lying down. Activity  Ask your health care provider when it is safe to drive if you have a cast, splint, or sling on your arm.  Do exercises as told by your health care provider.  Return to your normal activities as told by your health care provider. Ask your health care provider what activities are safe for you. Safety  Do not use the injured limb to support your body weight until your health care provider says that you can.  Do not lift anything that is heavier than 10 lb (4.5 kg), or the limit that you are told, until your health care provider says that it is safe.  Do not pull or push objects with your injured arm until your health care provider says that it is safe. General instructions  Do not use any products that contain nicotine or tobacco, such as cigarettes, e-cigarettes, and chewing tobacco. These can delay bone healing. If you need help quitting, ask your health care provider.  Keep all follow-up visits as told by your health care provider. This is important. How is this prevented?  Wear the appropriate protective equipment during sports, such as elbow pads.  Make sure to use equipment that fits you.  Maintain physical fitness, including: ? Strength. ? Flexibility. Contact a health care provider if you have:  Pain that gets worse or does not improve. Get help right away if you have:  Ongoing numbness or weakness in your elbow, hand, or fingers.  Discoloration of your fingers.  Severe pain that is much worse or different than before.  Swelling that suddenly worsens. Summary  A distal humerus elbow fracture is a break in the upper arm bone (humerus) near the elbow.  This condition may be caused by a hard, direct hit or falling onto an outstretched arm.  This condition is often treated with a splint or cast and a sling to hold your elbow still  (immobilized) while it heals. Surgery may be needed if the bones in your elbow are out of position.  Contact your health care provider if you have pain that gets worse or does not improve. This information is not intended to replace advice given to you by your health care provider. Make sure you discuss any questions you have with your health care provider. Document Revised: 10/13/2018 Document Reviewed: 09/01/2018 Elsevier Patient Education  [place].  Seizure, Adult A seizure is a sudden burst of abnormal electrical activity in the brain. Seizures usually last from 30 seconds to 2 minutes. They can cause many different symptoms. Usually, seizures are not harmful unless they last a long time. What are the causes? Common causes of this condition include:  Fever or infection.  Conditions that affect  the brain, such as: ? A brain abnormality that you were born with. ? A brain or head injury. ? Bleeding in the brain. ? A tumor. ? Stroke. ? Brain disorders such as autism or cerebral palsy.  Low blood sugar.  Conditions that are passed from parent to child (are inherited).  Problems with substances, such as: ? Having a reaction to a drug or a medicine. ? Suddenly stopping the use of a substance (withdrawal). In some cases, the cause may not be known. A person who has repeated seizures over time without a clear cause has a condition called epilepsy. What increases the risk? You are more likely to get this condition if you have:  A family history of epilepsy.  Had a seizure in the past.  A brain disorder.  A history of head injury, lack of oxygen at birth, or strokes. What are the signs or symptoms? There are many types of seizures. The symptoms vary depending on the type of seizure you have. Examples of symptoms during a seizure include:  Shaking (convulsions).  Stiffness in the body.  Passing out (losing consciousness).  Head nodding.  Staring.  Not  responding to sound or touch.  Loss of bladder control and bowel control. Some people have symptoms right before and right after a seizure happens. Symptoms before a seizure may include:  Fear.  Worry (anxiety).  Feeling like you may vomit (nauseous).  Feeling like the room is spinning (vertigo).  Feeling like you saw or heard something before (dj vu).  Odd tastes or smells.  Changes in how you see. You may see flashing lights or spots. Symptoms after a seizure happens can include:  Confusion.  Sleepiness.  Headache.  Weakness on one side of the body. How is this treated? Most seizures will stop on their own in under 5 minutes. In these cases, no treatment is needed. Seizures that last longer than 5 minutes will usually need treatment. Treatment can include:  Medicines given through an IV tube.  Avoiding things that are known to cause your seizures. These can include medicines that you take for another condition.  Medicines to treat epilepsy.  Surgery to stop the seizures. This may be needed if medicines do not help. Follow these instructions at home: Medicines  Take over-the-counter and prescription medicines only as told by your doctor.  Do not eat or drink anything that may keep your medicine from working, such as alcohol. Activity  Do not do any activities that would be dangerous if you had another seizure, like driving or swimming. Wait until your doctor says it is safe for you to do them.  If you live in the [place] ask your local DMV (department of motor vehicles) when you can drive.  Get plenty of rest. Teaching others Teach friends and family what to do when you have a seizure. They should:  Lay you on the ground.  Protect your head and body.  Loosen any tight clothing around your neck.  Turn you on your side.  Not hold you down.  Not put anything into your mouth.  Know whether or not you need emergency care.  Stay with you until you are  better.  General instructions  Contact your doctor each time you have a seizure.  Avoid anything that gives you seizures.  Keep a seizure diary. Write down: ? What you think caused each seizure. ? What you remember about each seizure.  Keep all follow-up visits as told by your doctor. This  is important. Contact a doctor if:  You have another seizure.  You have seizures more often.  There is any change in what happens during your seizures.  You keep having seizures with treatment.  You have symptoms of being sick or having an infection. Get help right away if:  You have a seizure that: ? Lasts longer than 5 minutes. ? Is different than seizures you had before. ? Makes it harder to breathe. ? Happens after you hurt your head.  You have any of these symptoms after a seizure: ? Not being able to speak. ? Not being able to use a part of your body. ? Confusion. ? A bad headache.  You have two or more seizures in a row.  You do not wake up right after a seizure.  You get hurt during a seizure. These symptoms may be an emergency. Do not wait to see if the symptoms will go away. Get medical help right away. Call your local emergency services (911 in the [place] Do not drive yourself to the hospital. Summary  Seizures usually last from 30 seconds to 2 minutes. Usually, they are not harmful unless they last a long time.  Do not eat or drink anything that may keep your medicine from working, such as alcohol.  Teach friends and family what to do when you have a seizure.  Contact your doctor each time you have a seizure. This information is not intended to replace advice given to you by your health care provider. Make sure you discuss any questions you have with your health care provider. Document Revised: 09/10/2018 Document Reviewed: 09/10/2018 Elsevier Patient Education  [place].

## 2019-08-08 NOTE — TOC Initial Note (Addendum)
Transition of Care [facility]) - Initial/Assessment Note    Patient Details  Name: [name] MRN: [id number] Date of Birth: 05/07/1951  [facility] CM/SW Contact:    Su Hilt, RN Phone Number: 08/08/2019, 9:44 AM  Clinical Narrative:                 Met with the patient who is def and mute, she wants to go home and not to rehab, She lives with her son and will have [facility] PT, OT and aide, she has a walker at home but needs a RW, I notified [facility], I provided the patients cell phone number I called the son [name] and left a VM letting him know she will DC today with [facility]       Patient Goals and CMS Choice        Expected Discharge Plan and Services                                                Prior Living Arrangements/Services                       Activities of Daily Living Home Assistive Devices/Equipment: Dentures (specify type), Cane (specify quad or straight) ADL Screening (condition at time of admission) Patient's cognitive ability adequate to safely complete daily activities?: Yes Is the patient deaf or have difficulty hearing?: Yes Does the patient have difficulty seeing, even when wearing glasses/contacts?: No Does the patient have difficulty concentrating, remembering, or making decisions?: No Patient able to express need for assistance with ADLs?: Yes Does the patient have difficulty dressing or bathing?: No Independently performs ADLs?: Yes (appropriate for developmental age) Does the patient have difficulty walking or climbing stairs?: No Weakness of Legs: None Weakness of Arms/Hands: None  Permission Sought/Granted                  Emotional Assessment              Admission diagnosis:  Hypokalemia [E87.6] Seizure ([place]) [R56.9] Acute pain due to trauma [G89.11] Comminuted fracture of right humerus [S42.351A] Closed fracture of proximal end of right humerus,  unspecified fracture morphology, initial encounter [S42.201A] Patient Active Problem List   Diagnosis Date Noted  . Comminuted fracture of right humerus 08/05/2019  . History of CVA in adulthood 08/05/2019  . COPD (chronic obstructive pulmonary disease) ([place])   . Deaf   . On home oxygen therapy   . Cirrhosis, non-alcoholic ([place])   . Asthma   . Fall   . Closed comminuted fracture of right humerus   . Hypomagnesemia   . PNA (pneumonia) 03/01/2018  . Bronchitis 09/23/2017  . Chronic diastolic heart failure ([place]) 08/26/2017  . Tobacco use 08/26/2017  . Seizure disorder ([place]) 06/15/2017  . Goals of care, counseling/discussion 05/06/2017  . Status epilepticus ([place])   . CAP (community acquired pneumonia)   . Hypoxia   . Hypokalemia 02/12/2017  . GERD (gastroesophageal reflux disease) 03/23/2016  . Depression 03/23/2016  . Diabetes mellitus without complication ([place]) 40/98/1191  . Essential hypertension 03/22/2016  . Neuropathy 03/22/2016  . Convulsions ([place]) 03/22/2016  . COPD exacerbation ([place]) 03/31/2015   PCP:  [facility]:   [place], [address]  [place] [address] Phone: [phone] Fax: [phone]  [place], [place] - [address] [place] [address] Phone: [phone] Fax: [phone]     Social Determinants of Health (SDOH) Interventions    Readmission Risk Interventions No flowsheet data found.

## 2019-08-08 NOTE — Consult Note (Signed)
Brief cardiology consultation note/ full note to follow Indication is tachycardia Patient status post fall with shoulder injury Patient ready for discharge  Reportedly the patient developed tachycardic rhythm narrow complex thought to be in SVT Rates between 120 and 130 Patient gives a history of palpitations and tachycardia in the past followed by Dr. Nehemiah Massed She has had difficulty with Cardizem with headaches Telemetry strips and EKG suggests a narrow complex tachycardic rhythm around 130 either SVT atrial flutter or AVNRT Patient subsequently converted back to sinus rhythm of 75 This appears to be paroxysmal Patient remained hemodynamically stable  Impression  narrow complex tachycardia/SVT versus atrial flutter versus AVNRT unlikely to be sinus tach  Recommend AV nodal blocking drug We will recommend metoprolol succinate 25 mg once a day Patient states she has had headaches on Cardizem thus far we chose to beta-blocker If symptoms persist or worsen would consider antiarrhythmic like amiodarone or sotalol Long-term ablation may also become an option Patient should be cleared to be discharged home and follow-up with cardiology 1 to 2 weeks

## 2019-08-08 NOTE — Discharge Summary (Signed)
Physician Discharge Summary  Patient ID: [name] MRN: [id number] DOB/AGE: [birth year] 68 y.o.  Admit date: 08/05/2019 Discharge date: 08/08/2019  Admission Diagnoses: Right shoulder pain  Discharge Diagnoses:  Principal Problem:   Comminuted fracture of right humerus Active Problems:   Diabetes mellitus without complication ([place])   Essential hypertension   Depression   Hypokalemia   Seizure disorder (HCC)   Chronic diastolic heart failure (HCC)   COPD (chronic obstructive pulmonary disease) ([place])   Deaf   On home oxygen therapy   Cirrhosis, non-alcoholic ([place])   Fall   Hypomagnesemia   History of CVA in adulthood   Discharged Condition: fair  Hospital Course:  Brief narrative: 69 year old female with history of seizure-like episodes (also concern for nonepileptic events) presented with fall. On exam was noted to have right upper extremity shaking movements briefly with eye movement fluctuations.   Comminuted fracture of right humerussecondary to mechanical fall:  Somewhat improving slowly  - CT-head is negative for acute intracranial abnormalities. CT of C-spine showed degenerative disc disease. -pain control; just enough narcotics prescribed.  Advised to follow-up with PCP if needed further. -prn Robaxin -Orthopedic surgeon, Dr. Mack Guise was consulted.  Recommended for conservative management at this time but if no improvement may perform surgery in a week.  Patient is to follow-up with orthopedic surgery outpatient clinic in a week -Meanwhile immobilization with her shoulder immobilizer as per orthopedic surgery -Continue pain management -Continue PT OT as per orthopedic surgery direction -Home with home health care PT OT and bedside commode  Acute hypokalemiaand hypomagnesemia/prolonged  -Resolved now  Seizure disorder (HCC)with witnessed seizure activity: No acute issues -OT was consulted and was not convinced patient was having any seizure also her  EEG was negative for any seizure. -Continue Vimpat 50 mg twice daily per neurology    -She is to follow-up with her neurologist Dr. Manuella Ghazi patient  Chronic diastolic heart failure ([place]): No new/acute issues - 2D echo on 07/13/2016 showed EF of 50-55% with grade 1 diastolic dysfunction. BNP 56. No significant leg edema, CHF is compensated. -Patient was started on low Toprol-XL instead of Cardizem 180 mg XL as patient complained of experiencing headache -May resume home Lasix -Follow-up cardiologist in a week recommended  SVT: Patient was experiencing intermittent SVT.  Consulted cardiology.  Recommended patient to start on Toprol-XL 25 mg daily since patient could not tolerate diltiazem 180 mg extended release 1 as it gives her headache per patient. -Strongly advised to follow-up with her cardiologist Dr. Nehemiah Massed in a week. -Patient's heart rate remained below 100 with EKG showing normal sinus rhythm to SVT  Consults: cardiology and orthopedic surgery  Significant Diagnostic Studies: Radiologic imaging and blood work  Treatments: Per hospital course and discharge med list  Discharge Exam: Blood pressure (!) 110/53, pulse 73, temperature 98.4 F (36.9 C), temperature source Oral, resp. rate 19, SpO2 93 %.  Constitutional:Mild to moderate distress as evidenced by ongoing right upper extremity pain  Eyes:PERRL, lids and conjunctivae normal ENMT:Mucous membranes aredry. Posterior pharynx clear of any exudate or lesions. Neck:normal, supple, no masses, no thyromegaly Respiratory:Anterior lung sounds are coarse to auscultation especially in left mid field. 2 L oxygen. No tachypnea or increased work of breathing. Cardiovascular: Tachycardic and regular rhythm, no murmurs / rubs / gallops. No extremity edema. 2+ pedal pulses. No carotid bruits.  Abdomen:no tenderness, no masses palpated. No hepatosplenomegaly. Bowel sounds positive.  Musculoskeletal: Tender to any movement of right  arm and shoulder.  Compromised range of motion  of right shoulder joint.  No clubbing / cyanosis. No joint deformity upper and lower extremities.patient with notable complaints of knee pain with palpation-no effusion noted,no contractures. Normal muscle tone.  Skin:no rashes, lesions, ulcers. No induration -patient does have bruising of all the fingers of the left hand and of the left elbow, she also has bilateral knee abrasions greater on the right. Neurologic:CN 2-12 grossly intact. Sensation intact, DTR normal. Strength 5/5 x all 4 extremities.  Psychiatric:Appears to have normal judgment and insightbut exam limited by inability to utilize sign language translator    Disposition: Discharge disposition: 06-Home-Health Care Svc     Stable to be discharged home with home health care physical therapy occupational therapy aide and bedside commode. -Close follow-up as indicated with PCP, cardiologist and orthopedic surgery strongly recommended  Discharge Instructions    Call MD for:  redness, tenderness, or signs of infection (pain, swelling, redness, odor or green/yellow discharge around incision site)   Complete by: As directed    Call MD for:  severe uncontrolled pain   Complete by: As directed    Call MD for:  temperature >100.4   Complete by: As directed    Diet - low sodium heart healthy   Complete by: As directed    Walk with assistance   Complete by: As directed    As per PT/OT and Ortho Recs     Allergies as of 08/08/2019      Reactions   Aspirin Itching   Celebrex [celecoxib] Itching   itching   Ciprofloxacin Itching   Codeine Itching   Fosphenytoin Itching   Levaquin [levofloxacin In D5w] Itching   Levofloxacin Itching   Lovastatin Itching   Penicillins    Documentation indicates severe reaction Pt tolerated cephalosporin without adverse reaction 09/18   Pravastatin Itching   Sulfa Antibiotics Itching      Medication List    STOP taking these medications    amLODipine 10 MG tablet Commonly known as: NORVASC   diltiazem 180 MG 24 hr capsule Commonly known as: CARDIZEM CD     TAKE these medications   acetaminophen 325 MG tablet Commonly known as: TYLENOL Take 2 tablets (650 mg total) by mouth every 6 (six) hours as needed for mild pain (or Fever >/= 101).   acidophilus Caps capsule Take 1 capsule by mouth daily.   albuterol 108 (90 Base) MCG/ACT inhaler Commonly known as: VENTOLIN HFA Inhale 2 puffs into the lungs every 4 (four) hours as needed for wheezing or shortness of breath.   albuterol (2.5 MG/3ML) 0.083% nebulizer solution Commonly known as: PROVENTIL Take 3 mLs (2.5 mg total) by nebulization every 4 (four) hours as needed for wheezing or shortness of breath. Notes to patient: Not given this hospitalization   citalopram 10 MG tablet Commonly known as: CELEXA Take 10 mg by mouth daily.   clopidogrel 75 MG tablet Commonly known as: PLAVIX Take 1 tablet (75 mg total) by mouth daily.   docusate sodium 100 MG capsule Commonly known as: COLACE Take 1 capsule (100 mg total) by mouth 2 (two) times daily as needed for mild constipation.   furosemide 40 MG tablet Commonly known as: LASIX Take 40 mg by mouth daily.   lacosamide 50 MG Tabs tablet Commonly known as: VIMPAT Take 1 tablet (50 mg total) by mouth 2 (two) times daily.   losartan 25 MG tablet Commonly known as: COZAAR Take 25 mg by mouth daily.   meclizine 25 MG tablet Commonly known as: ANTIVERT  Take 1 tablet (25 mg total) by mouth 3 (three) times daily as needed for dizziness.   methocarbamol 500 MG tablet Commonly known as: ROBAXIN Take 1 tablet (500 mg total) by mouth every 8 (eight) hours as needed for muscle spasms.   metoprolol succinate 25 MG 24 hr tablet Commonly known as: TOPROL-XL Take 1 tablet (25 mg total) by mouth daily.   montelukast 10 MG tablet Commonly known as: SINGULAIR Take 1 tablet by mouth at bedtime.   omeprazole 20 MG  capsule Commonly known as: PRILOSEC Take 20 mg by mouth daily.   Oxcarbazepine 300 MG tablet Commonly known as: TRILEPTAL Take 150 mg by mouth 2 (two) times daily.   oxyCODONE 5 MG immediate release tablet Commonly known as: Oxy IR/ROXICODONE Take 1 tablet (5 mg total) by mouth every 6 (six) hours as needed for moderate pain or severe pain.   polyethylene glycol 17 g packet Commonly known as: MIRALAX / GLYCOLAX Take 17 g by mouth daily as needed.   potassium chloride 10 MEQ tablet Commonly known as: KLOR-CON Take 2 tablets (20 mEq total) by mouth daily.   rosuvastatin 10 MG tablet Commonly known as: CRESTOR Take 10 mg by mouth daily.   Symbicort 160-4.5 MCG/ACT inhaler Generic drug: budesonide-formoterol Inhale 2 puffs into the lungs 2 (two) times daily.            Durable Medical Equipment  (From admission, onward)         Start     Ordered   08/08/19 0942  For home use only DME Bedside commode  Once    Comments: 3-in-1  Question:  Patient needs a bedside commode to treat with the following condition  Answer:  Hand fracture, right   08/08/19 [place], [facility]. Schedule an appointment as soon as possible for a visit in 2 weeks.   Why: f/u; Office to call Patient back w/ Appt Contact information: [place] [address] [phone]        Vladimir Crofts, MD. Schedule an appointment as soon as possible for a visit on 08/22/2019.   Specialty: Neurology Why: f/u ;  @ 9:15 am Contact information: [place] [facility] [place] [address] [phone]        Thornton Park, MD. Schedule an appointment as soon as possible for a visit in 10 days.   Specialty: Orthopedic Surgery Why: 1 to 2 weeks for reevaluation and x-ray; Cannot make Appt; Billing Issues; Patient to call Office Contact information: [place]  [phone]        Corey Skains, MD. Schedule an appointment as soon as possible for a visit on 08/22/2019.   Specialty: Cardiology Why: f/u;  w/ Hilbert Odor, PA-C @ 3:30 pm Contact information: [address] [facility] [place] [address] [phone]           Signed: Thornell Mule 08/08/2019, 3:51 PM

## 2019-08-08 NOTE — Consult Note (Signed)
CARDIOLOGY CONSULT NOTE               Patient ID: [name] MRN: [id number] DOB/AGE: 10/22/1950 69 y.o.  Admit date: 08/05/2019 Referring Physician: Thornell Mule, MD   Primary Physician: [place] Primary Cardiologist: Flossie Dibble, MD  Reason for Consultation: Ventricular Tachycardia and Supraventricular Ventricular Tachycardia  HPI: [name] is a 70 year old, caucasian female with a PMH significant for deafness, HTN, hyperlipidemia, SVT, COPD on 2L supplemental O2 via [place], DM Type II, epilepsy, CVA, OSA, non-alcoholic cirrhosis and tobacco abuse who presented to [facility] ED with a fall and was found to have a closed comminuted fracture of her right humerus. While in the ED the patient suffered three seizures and were treated with a loading dose of Vimpat IV and a dose of ativan. She was also found to be hypokalemic with a potassium level of 2.8 and hypomagnesemia with a magnesium level of 1.5 that were both replaced.  EKG showed sinus rhythm with a QTC of 568 and a mild T wave inversion in the inferior leads. She was admitted to the Orthopedic unit with remote telemetry.   Around 12 noon today, the patient was thought to have experienced a run of V-tach with subsequent SVT on the telemetry monitor. A second EKG was obtained and was questionable for an underlying narrow complex tachycardic arrhythmia with a HR=132. She was treated with 58m of Cardizem at that time.   Upon consult the patient states that she is "doing okay," she denies any chest pain or dyspnea at this time and only reports feeling palpitations. She states that she has been experiencing these intermittent episodes since the summer of 2020. Associated symptoms include dizziness. She has had a previous Holter event monitor, as an outpatient, that revealed multiple episodes of v-tach. She is currently in SR with a HR of 76 on the telemetry monitor. She appears to have labored breathing and is on  supplemental O2 via [place] at 3L.     Review of systems complete and found to be negative unless listed above    Past Medical History:  Diagnosis Date  . Asthma   . CHF (congestive heart failure) ([place]   . Cir[place]osis, non-alcoholic ([place]   . COPD (chronic obstructive pulmonary disease) ([facility]   . Deaf   . Depression   . Diabetes mellitus without complication ([facility]   . GERD (gastroesophageal reflux disease)   . Heart murmur   . Hepatitis   . Hypertension   . Lymph node disorder    arm  . Neuropathy   . On home oxygen therapy    hs  . Orthopnea   . RLS (restless legs syndrome)   . Seizures ([place]   . Shortness of breath dyspnea   . Sleep apnea   . Stroke ([facility]    tia    Past Surgical History:  Procedure Laterality Date  . CATARACT EXTRACTION W/PHACO Right 11/23/2014   Procedure: CATARACT EXTRACTION PHACO AND INTRAOCULAR LENS PLACEMENT (IOC);  Surgeon: NLyla Glassing MD;  Location: [facility] ORS;  Service: Ophthalmology;  Laterality: Right;  . CATARACT EXTRACTION W/PHACO Left 12/14/2014   Procedure: CATARACT EXTRACTION PHACO AND INTRAOCULAR LENS PLACEMENT (IOC);  Surgeon: NLyla Glassing MD;  Location: [facility] ORS;  Service: Ophthalmology;  Laterality: Left;  US:01:16.6 AP:15.8 CDE:12.14  . CESAREAN SECTION    . CHOLECYSTECTOMY    . KNEE ARTHROPLASTY    . THUMB ARTHROSCOPY    . TONSILLECTOMY    . TYMPANOPLASTY  muliple    Medications Prior to Admission  Medication Sig Dispense Refill Last Dose  . acidophilus (RISAQUAD) CAPS capsule Take 1 capsule by mouth daily. 30 capsule 0 08/04/2019 at Unknown time  . amLODipine (NORVASC) 10 MG tablet Take 10 mg by mouth daily.   08/04/2019 at Unknown time  . citalopram (CELEXA) 10 MG tablet Take 10 mg by mouth daily.   08/04/2019 at Unknown time  . clopidogrel (PLAVIX) 75 MG tablet Take 1 tablet (75 mg total) by mouth daily. 30 tablet 1 08/04/2019 at Unknown time  . diltiazem (CARDIZEM CD) 180 MG 24 hr capsule Take 180 mg by mouth daily.    08/04/2019 at Unknown time  . furosemide (LASIX) 40 MG tablet Take 40 mg by mouth daily.   08/04/2019 at Unknown time  . losartan (COZAAR) 25 MG tablet Take 25 mg by mouth daily.   08/04/2019 at Unknown time  . montelukast (SINGULAIR) 10 MG tablet Take 1 tablet by mouth at bedtime.  4 08/04/2019 at Unknown time  . omeprazole (PRILOSEC) 20 MG capsule Take 20 mg by mouth daily.   08/04/2019 at Unknown time  . Oxcarbazepine (TRILEPTAL) 300 MG tablet Take 150 mg by mouth 2 (two) times daily.   08/04/2019 at Unknown time  . rosuvastatin (CRESTOR) 10 MG tablet Take 10 mg by mouth daily.  5 08/04/2019 at Unknown time  . SYMBICORT 160-4.5 MCG/ACT inhaler Inhale 2 puffs into the lungs 2 (two) times daily.   08/04/2019 at Unknown time  . [DISCONTINUED] potassium chloride (K-DUR) 10 MEQ tablet Take 2 tablets (20 mEq total) by mouth daily. 120 tablet 3 08/04/2019 at Unknown time  . albuterol (PROVENTIL HFA;VENTOLIN HFA) 108 (90 BASE) MCG/ACT inhaler Inhale 2 puffs into the lungs every 4 (four) hours as needed for wheezing or shortness of breath.   prn at prn  . albuterol (PROVENTIL) (2.5 MG/3ML) 0.083% nebulizer solution Take 3 mLs (2.5 mg total) by nebulization every 4 (four) hours as needed for wheezing or shortness of breath. 90 mL 5 prn at prn  . docusate sodium (COLACE) 100 MG capsule Take 1 capsule (100 mg total) by mouth 2 (two) times daily as needed for mild constipation. 10 capsule 0 prn at prn  . meclizine (ANTIVERT) 25 MG tablet Take 1 tablet (25 mg total) by mouth 3 (three) times daily as needed for dizziness. 30 tablet 0 prn at prn   Social History   Socioeconomic History  . Marital status: Widowed    Spouse name: Not on file  . Number of children: Not on file  . Years of education: Not on file  . Highest education level: Not on file  Occupational History  . Not on file  Tobacco Use  . Smoking status: Current Every Day Smoker    Packs/day: 0.50    Years: 50.00    Pack years: 25.00    Types:  Cigarettes    Start date: 03/01/1968  . Smokeless tobacco: Never Used  Substance and Sexual Activity  . Alcohol use: No  . Drug use: No  . Sexual activity: Not Currently  Other Topics Concern  . 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    Social Determinants of Health   Financial Resource Strain:   .  Difficulty of Paying Living Expenses: Not on file  Food Insecurity:   . Worried About Charity fundraiser in the Last Year: Not on file  . Ran Out of Food in the Last Year: Not on file  Transportation Needs:   . Lack of Transportation (Medical): Not on file  . Lack of Transportation (Non-Medical): Not on file  Physical Activity:   . Days of Exercise per Week: Not on file  . Minutes of Exercise per Session: Not on file  Stress:   . Feeling of Stress : Not on file  Social Connections:   . Frequency of Communication with Friends and Family: Not on file  . Frequency of Social Gatherings with Friends and Family: Not on file  . Attends Religious Services: Not on file  . Active Member of Clubs or Organizations: Not on file  . Attends Archivist Meetings: Not on file  . Marital Status: Not on file  Intimate Partner Violence:   . Fear of Current or Ex-Partner: Not on file  . Emotionally Abused: Not on file  . Physically Abused: Not on file  . Sexually Abused: Not on file    Family History  Problem Relation Age of Onset  . Lung cancer Mother   . CAD Father       Review of systems complete and found to be negative unless listed above    PHYSICAL EXAM  General: Well developed, well nourished, in no acute distress HEENT:  Normocephalic and atraumatic, patient is deaf Neck:  No JVD.  Lungs: upper lung lobes clear, diminished breath sounds upon auscultation bilaterally at the  bases Heart: HRRR . Normal S1 and S2 without gallops or murmurs.  Abdomen: Bowel sounds are positive, abdomen soft and non-tender  Msk:  Pt is unable to ambulate independently at this time Extremities: No clubbing, cyanosis or edema.   Neuro: Alert and oriented X 3. Psych:  Good affect, responds appropriately  Labs:   Lab Results  Component Value Date   WBC 7.0 08/06/2019   HGB 12.1 08/06/2019   HCT 36.2 08/06/2019   MCV 82.8 08/06/2019   PLT 173 08/06/2019    Recent Labs  Lab 08/06/19 0623 08/07/19 0800 08/08/19 1331  NA 141  --  135  K 3.1*   < > 3.5  CL 104  --  99  CO2 28  --  26  BUN 10  --  12  CREATININE 0.35*  --  0.52  CALCIUM 8.3*  --  8.5*  PROT 6.0*  --   --   BILITOT 1.0  --   --   ALKPHOS 107  --   --   ALT 11  --   --   AST 17  --   --   GLUCOSE 187*  --  309*   < > = values in this interval not displayed.   Lab Results  Component Value Date   CKTOTAL 80 03/22/2016   CKMB < 0.5 (L) 11/05/2013   TROPONINI <0.03 03/01/2018   No results found for: CHOL No results found for: HDL No results found for: [facility] Lab Results  Component Value Date   TRIG 133 04/29/2017   No results found for: CHOLHDL No results found for: LDLDIRECT    Radiology: EEG  Result Date: 08/05/2019 Lora Havens, MD     08/05/2019  1:52 PM Patient Name: [name] MRN: [id number] Epilepsy Attending: Lora Havens Referring Physician/Provider: Dr Leotis Pain Date: 08/05/2019  Duration: 26.07 minutes Patient history: 69 year old female with history of seizure-like episodes (also concern for nonepileptic events) presented with fall.  On exam was noted to have right upper extremity shaking movements briefly with eye movement fluctuations.  EEG to evaluate for seizures. Level of alertness: Awake, asleep AEDs during EEG study: Oxcarbazepine, Vimpat Technical aspects: This EEG study was done with scalp electrodes positioned according to the 10-20 International system of  electrode placement. Electrical activity was acquired at a sampling rate of _0  and reviewed with a high frequency filter of _1  and a low frequency filter of _2 . EEG data were recorded continuously and digitally stored. Description: The posterior dominant rhythm consists of 8 Hz activity of moderate voltage (25-35 uV) seen predominantly in posterior head regions, symmetric and reactive to eye opening and eye closing.  Sleep was characterized by sleep spindles (12 to 14 Hz), maximal frontocentral.  Physiologic photic driving was not seen during photic stimulation.  Hyperventilation was not performed.       IMPRESSION: This study is within normal limits. No seizures or epileptiform discharges were seen throughout the recording. Lora Havens   DG Chest 1 View  Result Date: 08/05/2019 CLINICAL DATA:  Trauma secondary to a fall. Right arm pain. Head trauma. EXAM: CHEST  1 VIEW COMPARISON:  06/18/2019 FINDINGS: Heart size and pulmonary vascularity are normal and the lungs are clear. Aortic atherosclerosis. New comminuted impacted subluxed fracture of the right humeral head. No other significant bone abnormality. IMPRESSION: 1. No acute cardiopulmonary disease. 2. New comminuted impacted subluxed fracture of the right humeral head. Electronically Signed   By: Lorriane Shire M.D.   On: 08/05/2019 09:17   DG Pelvis 1-2 Views  Result Date: 08/05/2019 CLINICAL DATA:  Multiple trauma secondary to a fall. EXAM: PELVIS - 1-2 VIEW COMPARISON:  05/15/2012 FINDINGS: There is no evidence of pelvic fracture or diastasis. No pelvic bone lesions are seen. IMPRESSION: Normal exam. Electronically Signed   By: Lorriane Shire M.D.   On: 08/05/2019 09:21   CT Head Wo Contrast  Result Date: 08/05/2019 CLINICAL DATA:  Status post fall with a blow to the head. Initial encounter. EXAM: CT HEAD WITHOUT CONTRAST CT CERVICAL SPINE WITHOUT CONTRAST TECHNIQUE: Multidetector CT imaging of the head and cervical spine was performed  following the standard protocol without intravenous contrast. Multiplanar CT image reconstructions of the cervical spine were also generated. COMPARISON:  Head and cervical spine CT scans 06/15/2017. FINDINGS: CT HEAD FINDINGS Brain: No evidence of acute infarction, hemorrhage, hydrocephalus, extra-axial collection or mass lesion/mass effect. Vascular: No hyperdense vessel or unexpected calcification. Skull: Intact.  No focal lesion. Sinuses/Orbits: Status post cataract surgery.  Otherwise negative. Other: None. CT CERVICAL SPINE FINDINGS Alignment: Maintained. Skull base and vertebrae: No acute fracture. No primary bone lesion or focal pathologic process. Soft tissues and spinal canal: No prevertebral fluid or swelling. No visible canal hematoma. Disc levels: Loss of disc space height and mild endplate spurring are seen at C5-6. Upper chest: Lung apices clear. Other: None. IMPRESSION: No acute abnormality head or cervical spine. Degenerative disc disease C5-6. Electronically Signed   By: Inge Rise M.D.   On: 08/05/2019 08:50   CT Cervical Spine Wo Contrast  Result Date: 08/05/2019 CLINICAL DATA:  Status post fall with a blow to the head. Initial encounter. EXAM: CT HEAD WITHOUT CONTRAST CT CERVICAL SPINE WITHOUT CONTRAST TECHNIQUE: Multidetector CT imaging of the head and cervical spine was performed following the standard protocol without intravenous contrast. Multiplanar CT image  reconstructions of the cervical spine were also generated. COMPARISON:  Head and cervical spine CT scans 06/15/2017. FINDINGS: CT HEAD FINDINGS Brain: No evidence of acute infarction, hemorrhage, hydrocephalus, extra-axial collection or mass lesion/mass effect. Vascular: No hyperdense vessel or unexpected calcification. Skull: Intact.  No focal lesion. Sinuses/Orbits: Status post cataract surgery.  Otherwise negative. Other: None. CT CERVICAL SPINE FINDINGS Alignment: Maintained. Skull base and vertebrae: No acute fracture. No  primary bone lesion or focal pathologic process. Soft tissues and spinal canal: No prevertebral fluid or swelling. No visible canal hematoma. Disc levels: Loss of disc space height and mild endplate spurring are seen at C5-6. Upper chest: Lung apices clear. Other: None. IMPRESSION: No acute abnormality head or cervical spine. Degenerative disc disease C5-6. Electronically Signed   By: Inge Rise M.D.   On: 08/05/2019 08:50   CT SHOULDER RIGHT WO CONTRAST  Result Date: 08/05/2019 CLINICAL DATA:  [place]. Evaluate right humeral head and neck fractures. EXAM: CT OF THE UPPER RIGHT EXTREMITY WITHOUT CONTRAST TECHNIQUE: Multidetector CT imaging of the upper right extremity was performed according to the standard protocol. COMPARISON:  Radiographs 08/05/2019 FINDINGS: Complex comminuted fractures of the humeral head and neck are noted. There is a comminuted and impacted transverse fracture through the humeral neck and comminuted and displaced fractures of the greater and lesser tuberosities. The humeral head is normally located in the glenoid fossa. Moderate shortening is noted with increased humeroacromial distance. The [facility] joint is intact. Os acromial noted incidentally. No clavicle or scapular fracture. The visualized right ribs are intact. Grossly by CT the rotator cuff tendons are intact. The visualized lungs are clear. IMPRESSION: 1. Complex comminuted fractures of the humeral head and neck as described above. 2. Grossly intact rotator cuff tendons. 3. Intact AC joint.  Incidental os acromial. Electronically Signed   By: Marijo Sanes M.D.   On: 08/05/2019 14:19   DG Knee Complete 4 Views Right  Result Date: 08/05/2019 CLINICAL DATA:  Mechanical fall, no loss consciousness EXAM: RIGHT KNEE - COMPLETE 4+ VIEW COMPARISON:  None. FINDINGS: No evidence of fracture, dislocation, or joint effusion. No evidence of arthropathy or other focal bone abnormality. Soft tissues are unremarkable. IMPRESSION: No acute  osseous injury of the right knee. Electronically Signed   By: Kathreen Devoid   On: 08/05/2019 11:06   DG Humerus Right  Result Date: 08/05/2019 CLINICAL DATA:  Right arm pain secondary to a fall. EXAM: RIGHT HUMERUS - 2+ VIEW COMPARISON:  None FINDINGS: There is an impacted comminuted subluxed fracture of the right humeral head. The distal humerus is intact. IMPRESSION: Impacted comminuted subluxed fracture of the right humeral head. Electronically Signed   By: Lorriane Shire M.D.   On: 08/05/2019 09:20   DG Foot Complete Left  Result Date: 08/05/2019 CLINICAL DATA:  Pain secondary to a fall. EXAM: LEFT FOOT - COMPLETE 3+ VIEW COMPARISON:  None. FINDINGS: There is no evidence of fracture or dislocation. There is no evidence of arthropathy or other focal bone abnormality. Soft tissues are unremarkable. IMPRESSION: Negative. Electronically Signed   By: Lorriane Shire M.D.   On: 08/05/2019 09:19    EKG: initial EKG today was questionable for an underlying narrow complex tachycardic arrhythmia. Final EKG was noted to be SR.   ASSESSMENT AND PLAN:   1. SVT, intermittent, initial EKG today was questionable for an underlying narrow complex tachycardic arrhythmia with a HR=132 which is likely SVT, associated symptoms of dizziness and palpitations, patient was treated with 2m of Cardizem  prior to Cardiology consult, patient denies chest pain, patient is currently back in SR with a HR=76 confirmed by recent EKG post cardizem, stable at this time.   2. HTN, slightly hypotensive with bp 110/53 at this time, stable, managed with losartan and amlodipine.   3. HLD, stable, managed with Crestor.   4. DM type II, consistent hyperglycemia with the most recent CBG=261, relatively stable, managed with insulin.   5. COPD, chronic, patient on supplemental O2 at 3L via [place], managed with albuterol nebulizer treatments,  Dulera inhaler and followed by RT.    6. Seizures, patient has a history of epilepsy and  experienced 3 seizures during her ED course and was treated with Vimpat and ativan at that time, currently stable and managed with Vimpat and trileptal.   7. H/o CVA, stable, patient is currently on Plavix therapy.      Plan: 1. No indication for further cardiac diagnostics at this time but should be further evaluated upon follow up with primary Cardiologist as an outpatient.  2.  Recommend starting metoprolol succinate 48m daily for HR control and treatment of intermittent SVT and stop Amlodipine at this time. Continue all other home cardiovascular medications including enalapril and lasix at their current dosages.  3. Follow up with Dr. KNehemiah Massedin 1 week.   4. Patient can be discharged from a Cardiology standpoint.   Discussed with Dr. CClayborn Bignesswho also evaluated the patient and the plan was made in collaboration with him.      Signed: DGladstone Pih NP, ACNPC-AG  08/08/2019, 2:52 PM

## 2019-08-08 NOTE — Progress Notes (Signed)
Patient discharged home per MD order. All discharge instructions given to patient and all questions answered.

## 2019-08-09 LAB — LACOSAMIDE: Lacosamide: <0.5 ug/mL — ABNORMAL LOW (ref 5.0–10.0)

## 2019-08-13 ENCOUNTER — Other Ambulatory Visit: Payer: Self-pay

## 2019-08-13 ENCOUNTER — Encounter: Payer: Self-pay | Admitting: Emergency Medicine

## 2019-08-13 ENCOUNTER — Emergency Department: Payer: Medicare Other

## 2019-08-13 ENCOUNTER — Emergency Department
Admission: EM | Admit: 2019-08-13 | Discharge: 2019-08-13 | Disposition: A | Discharge status: Home / Self Care | Payer: Medicare Other | Attending: Emergency Medicine | Admitting: Emergency Medicine

## 2019-08-13 DIAGNOSIS — M79601 Pain in right arm: Secondary | ICD-10-CM

## 2019-08-13 DIAGNOSIS — X58XXXD Exposure to other specified factors, subsequent encounter: Secondary | ICD-10-CM | POA: Diagnosis not present

## 2019-08-13 DIAGNOSIS — Z7984 Long term (current) use of oral hypoglycemic drugs: Secondary | ICD-10-CM | POA: Insufficient documentation

## 2019-08-13 DIAGNOSIS — I110 Hypertensive heart disease with heart failure: Secondary | ICD-10-CM | POA: Diagnosis not present

## 2019-08-13 DIAGNOSIS — R112 Nausea with vomiting, unspecified: Secondary | ICD-10-CM | POA: Insufficient documentation

## 2019-08-13 DIAGNOSIS — E119 Type 2 diabetes mellitus without complications: Secondary | ICD-10-CM | POA: Insufficient documentation

## 2019-08-13 DIAGNOSIS — K219 Gastro-esophageal reflux disease without esophagitis: Secondary | ICD-10-CM | POA: Diagnosis not present

## 2019-08-13 DIAGNOSIS — M79621 Pain in right upper arm: Secondary | ICD-10-CM | POA: Insufficient documentation

## 2019-08-13 DIAGNOSIS — Z79899 Other long term (current) drug therapy: Secondary | ICD-10-CM | POA: Insufficient documentation

## 2019-08-13 DIAGNOSIS — S42201D Unspecified fracture of upper end of right humerus, subsequent encounter for fracture with routine healing: Secondary | ICD-10-CM | POA: Insufficient documentation

## 2019-08-13 DIAGNOSIS — I509 Heart failure, unspecified: Secondary | ICD-10-CM | POA: Diagnosis not present

## 2019-08-13 LAB — LIPASE, BLOOD: Lipase: 19 U/L (ref 11–51)

## 2019-08-13 LAB — COMPREHENSIVE METABOLIC PANEL
ALT: 12 U/L (ref 0–44)
AST: 17 U/L (ref 15–41)
Albumin: 3 g/dL — ABNORMAL LOW (ref 3.5–5.0)
Alkaline Phosphatase: 112 U/L (ref 38–126)
Anion gap: 12 (ref 5–15)
BUN: 9 mg/dL (ref 8–23)
CO2: 27 mmol/L (ref 22–32)
Calcium: 8.4 mg/dL — ABNORMAL LOW (ref 8.9–10.3)
Chloride: 100 mmol/L (ref 98–111)
Creatinine, Ser: 0.44 mg/dL (ref 0.44–1.00)
GFR calc Af Amer: >60 mL/min (ref >60)
GFR calc non Af Amer: >60 mL/min (ref >60)
Glucose, Bld: 187 mg/dL — ABNORMAL HIGH (ref 70–99)
Potassium: 3.3 mmol/L — ABNORMAL LOW (ref 3.5–5.1)
Sodium: 139 mmol/L (ref 135–145)
Total Bilirubin: 0.8 mg/dL (ref 0.3–1.2)
Total Protein: 6.4 g/dL — ABNORMAL LOW (ref 6.5–8.1)

## 2019-08-13 LAB — CBC WITH DIFFERENTIAL/PLATELET
Abs Immature Granulocytes: 0.03 10*3/uL (ref 0.00–0.07)
Basophils Absolute: 0 10*3/uL (ref 0.0–0.1)
Basophils Relative: 1 %
Eosinophils Absolute: 0.1 10*3/uL (ref 0.0–0.5)
Eosinophils Relative: 1 %
HCT: 37.5 % (ref 36.0–46.0)
Hemoglobin: 12.6 g/dL (ref 12.0–15.0)
Immature Granulocytes: 0 %
Lymphocytes Relative: 20 %
Lymphs Abs: 1.4 10*3/uL (ref 0.7–4.0)
MCH: 27.8 pg (ref 26.0–34.0)
MCHC: 33.6 g/dL (ref 30.0–36.0)
MCV: 82.8 fL (ref 80.0–100.0)
Monocytes Absolute: 0.5 10*3/uL (ref 0.1–1.0)
Monocytes Relative: 7 %
Neutro Abs: 4.9 10*3/uL (ref 1.7–7.7)
Neutrophils Relative %: 71 %
Platelets: 274 10*3/uL (ref 150–400)
RBC: 4.53 MIL/uL (ref 3.87–5.11)
RDW: 13.2 % (ref 11.5–15.5)
WBC: 7 10*3/uL (ref 4.0–10.5)
nRBC: 0 % (ref 0.0–0.2)

## 2019-08-13 LAB — LACTIC ACID, PLASMA: Lactic Acid, Venous: 0.7 mmol/L (ref 0.5–1.9)

## 2019-08-13 MED ORDER — ONDANSETRON 8 MG PO TBDP: 8.0000 mg | ORAL_TABLET | Freq: Three times a day (TID) | ORAL | 0 refills | Status: DC | PRN

## 2019-08-13 MED ORDER — ONDANSETRON HCL 4 MG/2ML IJ SOLN
4.0000 mg | INTRAMUSCULAR | Status: AC
  Administered 2019-08-13: 4 mg via INTRAVENOUS
  Filled 2019-08-13: qty 2

## 2019-08-13 MED ORDER — SODIUM CHLORIDE 0.9 % IV BOLUS
1000.0000 mL | Freq: Once | INTRAVENOUS | Status: AC
  Administered 2019-08-13: 1000 mL via INTRAVENOUS

## 2019-08-13 MED ORDER — MORPHINE SULFATE (PF) 4 MG/ML IV SOLN
4.0000 mg | Freq: Once | INTRAVENOUS | Status: AC
  Administered 2019-08-13: 08:00:00 4 mg via INTRAVENOUS
  Filled 2019-08-13: qty 1

## 2019-08-13 MED ORDER — MORPHINE SULFATE (PF) 4 MG/ML IV SOLN
4.0000 mg | Freq: Once | INTRAVENOUS | Status: AC
  Administered 2019-08-13: 4 mg via INTRAVENOUS
  Filled 2019-08-13: qty 1

## 2019-08-13 NOTE — ED Triage Notes (Addendum)
Patient brought in by ems form home. Patient with complaint of nausea and vomiting that started at 02:00 this am. Patient received her first covid vaccination yesterday. Patient with a recent fall and fracture to right humerus. Patient with complaint of right arm pain.

## 2019-08-13 NOTE — ED Provider Notes (Signed)
[facility] Emergency Department Provider Note ____________________________________________   First MD Initiated Contact with Patient 08/13/19 [phone]     (approximate)  I have reviewed the triage vital signs and the nursing notes.   HISTORY  Chief Complaint Emesis and Arm Pain  Level 5 caveat: History of present illness limited due to hearing impairment  Patient declined sign language interpreter and chose to communicate by lip reading and writing.  HPI [name] is a 69 y.o. female with PMH as noted below including a recent right humerus fracture who presents with nausea and vomiting for approximately the last 5 hours, acute onset, and associated with belt-like upper abdominal pain.  She denies diarrhea or fever.  In addition, the patient reports persistent right arm pain that is not adequately relieved by the oxycodone that she was prescribed.  She denies any new injuries.  Past Medical History:  Diagnosis Date  . Asthma   . CHF (congestive heart failure) ([place])   . Cirrhosis, non-alcoholic ([place])   . COPD (chronic obstructive pulmonary disease) ([place])   . Deaf   . Depression   . Diabetes mellitus without complication ([place])   . GERD (gastroesophageal reflux disease)   . Heart murmur   . Hepatitis   . Hypertension   . Lymph node disorder    arm  . Neuropathy   . On home oxygen therapy    hs  . Orthopnea   . RLS (restless legs syndrome)   . Seizures ([place])   . Shortness of breath dyspnea   . Sleep apnea   . Stroke [facility])    tia    Patient Active Problem List   Diagnosis Date Noted  . Comminuted fracture of right humerus 08/05/2019  . History of CVA in adulthood 08/05/2019  . COPD (chronic obstructive pulmonary disease) ([place])   . Deaf   . On home oxygen therapy   . Cirrhosis, non-alcoholic ([place])   . Asthma   . Fall   . Closed comminuted fracture of right humerus   . Hypomagnesemia   . PNA (pneumonia) 03/01/2018  . Bronchitis  09/23/2017  . Chronic diastolic heart failure ([place]) 08/26/2017  . Tobacco use 08/26/2017  . Seizure disorder ([place]) 06/15/2017  . Goals of care, counseling/discussion 05/06/2017  . Status epilepticus ([place])   . CAP (community acquired pneumonia)   . Hypoxia   . Hypokalemia 02/12/2017  . GERD (gastroesophageal reflux disease) 03/23/2016  . Depression 03/23/2016  . Diabetes mellitus without complication ([place]) 02/54/2706  . Essential hypertension 03/22/2016  . Neuropathy 03/22/2016  . Convulsions ([place]) 03/22/2016  . COPD exacerbation ([place]) 03/31/2015    Past Surgical History:  Procedure Laterality Date  . CATARACT EXTRACTION W/PHACO Right 11/23/2014   Procedure: CATARACT EXTRACTION PHACO AND INTRAOCULAR LENS PLACEMENT (IOC);  Surgeon: Lyla Glassing, MD;  Location: [facility] ORS;  Service: Ophthalmology;  Laterality: Right;  . CATARACT EXTRACTION W/PHACO Left 12/14/2014   Procedure: CATARACT EXTRACTION PHACO AND INTRAOCULAR LENS PLACEMENT (IOC);  Surgeon: Lyla Glassing, MD;  Location: [facility] ORS;  Service: Ophthalmology;  Laterality: Left;  US:01:16.6 AP:15.8 CDE:12.14  . CESAREAN SECTION    . CHOLECYSTECTOMY    . KNEE ARTHROPLASTY    . THUMB ARTHROSCOPY    . TONSILLECTOMY    . TYMPANOPLASTY     muliple    Prior to Admission medications   Medication Sig Start Date End Date Taking? Authorizing Provider  acetaminophen (TYLENOL) 325 MG tablet Take 2 tablets (650 mg total) by mouth every  6 (six) hours as needed for mild pain (or Fever >/= 101). 08/08/19   Thornell Mule, MD  acidophilus (RISAQUAD) CAPS capsule Take 1 capsule by mouth daily. 06/27/17   Gladstone Lighter, MD  albuterol (PROVENTIL HFA;VENTOLIN HFA) 108 (90 BASE) MCG/ACT inhaler Inhale 2 puffs into the lungs every 4 (four) hours as needed for wheezing or shortness of breath.    [provider]  albuterol (PROVENTIL) (2.5 MG/3ML) 0.083% nebulizer solution Take 3 mLs (2.5 mg total) by nebulization every 4 (four) hours as  needed for wheezing or shortness of breath. 09/23/17   Alisa Graff, FNP  citalopram (CELEXA) 10 MG tablet Take 10 mg by mouth daily. 03/02/19   [provider]  clopidogrel (PLAVIX) 75 MG tablet Take 1 tablet (75 mg total) by mouth daily. 07/17/16   Vaughan Basta, MD  docusate sodium (COLACE) 100 MG capsule Take 1 capsule (100 mg total) by mouth 2 (two) times daily as needed for mild constipation. 03/26/19   Nicholes Mango, MD  furosemide (LASIX) 40 MG tablet Take 40 mg by mouth daily. 07/22/18   [provider]  lacosamide (VIMPAT) 50 MG TABS tablet Take 1 tablet (50 mg total) by mouth 2 (two) times daily. 08/08/19   Thornell Mule, MD  losartan (COZAAR) 25 MG tablet Take 25 mg by mouth daily. 03/02/19   [provider]  meclizine (ANTIVERT) 25 MG tablet Take 1 tablet (25 mg total) by mouth 3 (three) times daily as needed for dizziness. 03/26/19   Nicholes Mango, MD  methocarbamol (ROBAXIN) 500 MG tablet Take 1 tablet (500 mg total) by mouth every 8 (eight) hours as needed for muscle spasms. 08/08/19   Thornell Mule, MD  metoprolol succinate (TOPROL-XL) 25 MG 24 hr tablet Take 1 tablet (25 mg total) by mouth daily. 08/08/19   Thornell Mule, MD  montelukast (SINGULAIR) 10 MG tablet Take 1 tablet by mouth at bedtime. 03/18/16   [provider]  omeprazole (PRILOSEC) 20 MG capsule Take 20 mg by mouth daily.    [provider]  ondansetron (ZOFRAN ODT) 8 MG disintegrating tablet Take 1 tablet (8 mg total) by mouth every 8 (eight) hours as needed for nausea or vomiting. 08/13/19   Arta Silence, MD  Oxcarbazepine (TRILEPTAL) 300 MG tablet Take 150 mg by mouth 2 (two) times daily. 02/22/19   [provider]  oxyCODONE (OXY IR/ROXICODONE) 5 MG immediate release tablet Take 1 tablet (5 mg total) by mouth every 6 (six) hours as needed for moderate pain or severe pain. 08/08/19   Thornell Mule, MD  polyethylene glycol (MIRALAX / GLYCOLAX) 17 g packet Take 17  g by mouth daily as needed. 08/08/19   Thornell Mule, MD  potassium chloride (KLOR-CON) 10 MEQ tablet Take 2 tablets (20 mEq total) by mouth daily. 08/08/19 09/07/19  Thornell Mule, MD  rosuvastatin (CRESTOR) 10 MG tablet Take 10 mg by mouth daily. 04/21/17   [provider]  SYMBICORT 160-4.5 MCG/ACT inhaler Inhale 2 puffs into the lungs 2 (two) times daily. 02/28/19   [provider]    Allergies Aspirin, Celebrex [celecoxib], Ciprofloxacin, Codeine, Fosphenytoin, Levaquin [levofloxacin in d5w], Levofloxacin, Lovastatin, Penicillins, Pravastatin, and Sulfa antibiotics  Family History  Problem Relation Age of Onset  . Lung cancer Mother   . CAD Father     Social History Social History   Tobacco Use  . Smoking status: Current Every Day Smoker    Packs/day: 0.50    Years: 50.00    Pack  years: 25.00    Types: Cigarettes    Start date: 03/01/1968  . Smokeless tobacco: Never Used  Substance Use Topics  . Alcohol use: No  . Drug use: No    Review of Systems Level 5 caveat: Review of systems limited due to hearing impairment Constitutional: No fever. Eyes: No redness. Cardiovascular: Denies chest pain. Gastrointestinal: Positive for nausea and vomiting. Genitourinary: Negative for dysuria.  Musculoskeletal: Positive for right arm pain. Skin: Negative for rash. Neurological: Negative for headache.   ____________________________________________   PHYSICAL EXAM:  VITAL SIGNS: ED Triage Vitals  Enc Vitals Group     BP 08/13/19 0637 (!) 163/96     Pulse Rate 08/13/19 0637 (!) 128     Resp 08/13/19 0637 (!) 30     Temp 08/13/19 0637 98.5 F (36.9 C)     Temp Source 08/13/19 0637 Oral     SpO2 08/13/19 0637 94 %     Weight 08/13/19 0636 153 lb (69.4 kg)     Height 08/13/19 0636 _0  (1.6 m)     Head Circumference --      Peak Flow --      Pain Score 08/13/19 0635 10     Pain Loc --      Pain Edu? --      Excl. in [place]? --     Constitutional: Alert and  oriented.  Relatively well appearing and in no acute distress. Eyes: Conjunctivae are normal.  EOMI.  No scleral icterus. Head: Atraumatic. Nose: No congestion/rhinnorhea. Mouth/Throat: Mucous membranes are moist.   Neck: Normal range of motion.  Cardiovascular: Tachycardic, regular rhythm.  Good peripheral circulation. Respiratory: Normal respiratory effort.  No retractions.  Gastrointestinal: Soft with mild right upper abdominal discomfort but no focal tenderness.  No distention.  Genitourinary: No flank tenderness. Musculoskeletal: No lower extremity edema.  Extremities warm and well perfused.  Tenderness to the right humerus.  Right arm in a shoulder immobilizer.  2+ radial pulse. Neurologic:  No gross focal neurologic deficits are appreciated.  Skin:  Skin is warm and dry. No rash noted. Psychiatric: Mood and affect are normal. Speech and behavior are normal.  ____________________________________________   LABS (all labs ordered are listed, but only abnormal results are displayed)  Labs Reviewed  COMPREHENSIVE METABOLIC PANEL - Abnormal; Notable for the following components:      Result Value   Potassium 3.3 (*)    Glucose, Bld 187 (*)    Calcium 8.4 (*)    Total Protein 6.4 (*)    Albumin 3.0 (*)    All other components within normal limits  CBC WITH DIFFERENTIAL/PLATELET  LIPASE, BLOOD  LACTIC ACID, PLASMA  URINALYSIS, ROUTINE W REFLEX MICROSCOPIC  LACTIC ACID, PLASMA  URINALYSIS, COMPLETE (UACMP) WITH MICROSCOPIC   ____________________________________________  EKG  ED ECG REPORT I, Arta Silence, the attending physician, personally viewed and interpreted this ECG.  Date: 08/13/2019 EKG Time: 0638 Rate: 128 Rhythm: Sinus tachycardia QRS Axis: normal Intervals: normal ST/T Wave abnormalities: Nonspecific diffuse ST abnormalities Narrative Interpretation: Sinus tachycardia with no evidence of acute  ischemia  ____________________________________________  RADIOLOGY  XR R humerus: Stable appearance of proximal humerus fracture  ____________________________________________   PROCEDURES  Procedure(s) performed: No  Procedures  Critical Care performed: No ____________________________________________   INITIAL IMPRESSION / ASSESSMENT AND PLAN / ED COURSE  Pertinent labs & imaging results that were available during my care of the patient were reviewed by me and considered in my medical decision making (see chart  for details).  69 year old female with PMH as noted above including COPD, diastolic CHF and seizure disorder as well as a recent admission for hypokalemia and right humerus fracture presents with nausea and vomiting for the last several hours with some upper abdominal pain, as well as persistent pain to the right arm.  She apparently had her Covid 19 vaccine yesterday.  I reviewed the past medical records in [place].  The patient was admitted earlier this month after a fall resulting in the right humerus fracture which was treated nonoperatively.  She was also treated for hypokalemia and worked up for seizures with an EEG that was negative.  Previously she was seen in the ED for hypokalemia on 12/12, and admitted for hypokalemia last September.  It appears that the patient frequently presents with tachycardia.  On exam, the patient is uncomfortable but overall relatively well-appearing.  She is tachycardic and mildly hypertensive with otherwise normal vital signs.  She has mild right upper abdominal discomfort but no significant focal tenderness.  She has pain on palpation of the right upper arm.    In terms of the persistent right arm pain, I see that the patient was discharged with oxycodone and she states that it does not help.  The arm appears properly immobilized.  It is neuro/vascular intact.  We will obtain x-rays to rule out any significant movement of the fracture or other  complication.  Differential for the vomiting includes gastritis, gastroenteritis, gastroparesis, pancreatitis, hepatobiliary cause, vaccine side effect, or less likely other intra-abdominal cause.  We will treat symptomatically with Zofran, give fluids, obtain lab work-up and reassess.  ----------------------------------------- 10:21 AM on 08/13/2019 -----------------------------------------  The patient has now been resting comfortably for a few hours.  Her heart rate has gone down to the 60s.  She has had no further vomiting in the ED.  On reassessment, she appears comfortable and states she feels well to be discharged.  The x-ray of the right humerus shows a stable appearance of the fracture.  The lab work-up is unremarkable and the patient's potassium is normal.  She has not given a urine sample, however she denied any urinary symptoms, has no fever or elevated WBC count, and no clinical evidence of UTI.  At this time, the patient is stable for discharge to her facility.  I counseled her on the results of the work-up.  I advised her to continue taking the oxycodone as needed, and follow-up with orthopedics as scheduled.  Return precautions given, she expressed understanding. ____________________________________________   FINAL CLINICAL IMPRESSION(S) / ED DIAGNOSES  Final diagnoses:  Non-intractable vomiting with nausea, unspecified vomiting type  Right arm pain      NEW MEDICATIONS STARTED DURING THIS VISIT:  New Prescriptions   ONDANSETRON (ZOFRAN ODT) 8 MG DISINTEGRATING TABLET    Take 1 tablet (8 mg total) by mouth every 8 (eight) hours as needed for nausea or vomiting.     Note:  This document was prepared using Dragon voice recognition software and may include unintentional dictation errors.    Arta Silence, MD 08/13/19 1023

## 2019-08-13 NOTE — Discharge Instructions (Signed)
Follow-up with your primary care doctor and with the orthopedist as scheduled.  Continue to take the oxycodone as needed for pain.  You can take the prescribed Zofran as needed for nausea.  Return to the ER for new, worsening, or persistent severe arm pain, numbness, weakness, fever, abdominal pain, vomiting, or any other new or worsening symptoms that concern you.

## 2019-08-13 NOTE — ED Notes (Signed)
ED Provider at bedside.

## 2019-11-16 ENCOUNTER — Other Ambulatory Visit: Payer: Self-pay | Admitting: Orthopedic Surgery

## 2019-11-16 DIAGNOSIS — M19011 Primary osteoarthritis, right shoulder: Secondary | ICD-10-CM

## 2019-11-22 ENCOUNTER — Ambulatory Visit
Admission: RE | Admit: 2019-11-22 | Discharge: 2019-11-22 | Disposition: A | Discharge status: Home / Self Care | Payer: Medicare Other | Source: Ambulatory Visit | Attending: Orthopedic Surgery | Admitting: Orthopedic Surgery

## 2019-11-22 ENCOUNTER — Other Ambulatory Visit: Payer: Self-pay

## 2019-11-22 DIAGNOSIS — M19011 Primary osteoarthritis, right shoulder: Secondary | ICD-10-CM | POA: Insufficient documentation

## 2019-12-07 ENCOUNTER — Other Ambulatory Visit: Payer: Self-pay | Admitting: Orthopedic Surgery

## 2019-12-07 ENCOUNTER — Encounter: Payer: Self-pay | Admitting: Orthopedic Surgery

## 2019-12-07 NOTE — H&P (Deleted)
The note originally documented on this encounter has been moved the the encounter in which it belongs.  

## 2019-12-07 NOTE — H&P (Signed)
NAME: [name] MRN:   [id number] DOB:   09-28-1950     HISTORY AND PHYSICAL  CHIEF COMPLAINT:  Right shoulder pain  HISTORY:   Karen Latka Stewartis a 69 y.o. female  with right  Shoulder Pain Patient complains of right shoulder pain. The symptoms began several years ago. Aggravating factors: repetitive activity. Pain is located in the right glenohumeral region. Discomfort is described as aching. Symptoms are exacerbated by repetitive movements, overhead movements and lying on the shoulder. Evaluation to date: plain films: abnormal osteoarthritis. Therapy to date includes: rest, ice, avoidance of offending activity, OTC analgesics which are somewhat effective, prescription NSAIDS which are somewhat effective, home exercises which are somewhat effective, physical therapy which was somewhat effective and corticosteroid injection which was somewhat effective. Plan forvRight reverse total shoulder arthroplasty  PAST MEDICAL HISTORY:   Past Medical History:  Diagnosis Date  . Asthma   . CHF (congestive heart failure) ([place])   . Cirrhosis, non-alcoholic ([place])   . COPD (chronic obstructive pulmonary disease) ([place])   . Deaf   . Depression   . Diabetes mellitus without complication ([place])   . GERD (gastroesophageal reflux disease)   . Heart murmur   . Hepatitis   . Hypertension   . Lymph node disorder    arm  . Neuropathy   . On home oxygen therapy    hs  . Orthopnea   . RLS (restless legs syndrome)   . Seizures ([place])   . Shortness of breath dyspnea   . Sleep apnea   . Stroke [facility])    tia    PAST SURGICAL HISTORY:   Past Surgical History:  Procedure Laterality Date  . CATARACT EXTRACTION W/PHACO Right 11/23/2014   Procedure: CATARACT EXTRACTION PHACO AND INTRAOCULAR LENS PLACEMENT (IOC);  Surgeon: Lyla Glassing, MD;  Location: [facility] ORS;  Service: Ophthalmology;  Laterality: Right;  . CATARACT EXTRACTION W/PHACO Left 12/14/2014   Procedure: CATARACT EXTRACTION PHACO AND INTRAOCULAR  LENS PLACEMENT (IOC);  Surgeon: Lyla Glassing, MD;  Location: [facility] ORS;  Service: Ophthalmology;  Laterality: Left;  US:01:16.6 AP:15.8 CDE:12.14  . CESAREAN SECTION    . CHOLECYSTECTOMY    . KNEE ARTHROPLASTY    . THUMB ARTHROSCOPY    . TONSILLECTOMY    . TYMPANOPLASTY     muliple    MEDICATIONS:  (Not in a hospital admission)   ALLERGIES:   Allergies  Allergen Reactions  . Aspirin Itching  . Celebrex [Celecoxib] Itching    itching  . Ciprofloxacin Itching  . Codeine Itching  . Fosphenytoin Itching  . Levaquin [Levofloxacin In D5w] Itching  . Levofloxacin Itching  . Lovastatin Itching  . Penicillins     Documentation indicates severe reaction  Pt tolerated cephalosporin without adverse reaction 09/18   . Pravastatin Itching  . Sulfa Antibiotics Itching    REVIEW OF SYSTEMS:   Negative except HPI  FAMILY HISTORY:   Family History  Problem Relation Age of Onset  . Lung cancer Mother   . CAD Father     SOCIAL HISTORY:   reports that she has been smoking cigarettes. She started smoking about 51 years ago. She has a 25.00 pack-year smoking history. She has never used smokeless tobacco. She reports that she does not drink alcohol or use drugs.  PHYSICAL EXAM:  General appearance: alert, cooperative and no distress Neck: no JVD, supple, symmetrical, trachea midline and thyroid not enlarged, symmetric, no tenderness/mass/nodules Resp: clear to auscultation bilaterally Cardio: regular rate and rhythm, S1, S2  normal, no murmur, click, rub or gallop GI: soft, non-tender; bowel sounds normal; no masses,  no organomegaly Extremities: extremities normal, atraumatic, no cyanosis or edema and Homans sign is negative, no sign of DVT Pulses: 2+ and symmetric Skin: Skin color, texture, turgor normal. No rashes or lesions Neurologic: Alert and oriented X 3, normal strength and tone. Normal symmetric reflexes. Normal coordination and gait    LABORATORY STUDIES: No results for  input(s): WBC, HGB, HCT, PLT in the last 72 hours.  No results for input(s): NA, K, CL, CO2, GLUCOSE, BUN, CREATININE, CALCIUM in the last 72 hours.  STUDIES/RESULTS:  CT SHOULDER RIGHT WO CONTRAST  Result Date: 11/22/2019 CLINICAL DATA:  Status post fall, right shoulder pain, pre-surgical planning EXAM: CT OF THE UPPER RIGHT EXTREMITY WITHOUT CONTRAST TECHNIQUE: Multidetector CT imaging of the upper right extremity was performed according to the standard protocol. COMPARISON:  None. FINDINGS: Bones/Joint/Cartilage Healed comminuted malunited fracture of the surgical neck of the proximal humerus and extension into the greater and lesser tuberosities. Inferior pseudosubluxation of the humeral head relative to the glenoid secondary to large joint effusion. Subchondral sclerosis in the superior humeral head concerning for avascular necrosis. No acute fracture or dislocation. No aggressive osseous lesion. Moderate arthropathy of the acromioclavicular joint. Incidental note made of an os acromiale. Ligaments Ligaments are suboptimally evaluated by CT. Muscles and Tendons Muscles are normal. No intramuscular fluid collection or hematoma. Soft tissue No fluid collection or hematoma. No soft tissue mass. IMPRESSION: 1. Healed comminuted malunited fracture of the surgical neck of the proximal humerus and extension into the greater and lesser tuberosities. 2. Inferior pseudosubluxation of the humeral head relative to the glenoid secondary to large joint effusion. 3. Subchondral sclerosis in the superior humeral head concerning for avascular necrosis. Electronically Signed   By: Kathreen Devoid   On: 11/22/2019 11:24    ASSESSMENT:  End stage osteoarthritis right shoulder        Active Problems:   * No active hospital problems. *    PLAN:  Right reverse total shoulder arthroplasty   Carlynn Spry 12/07/2019. 2:01 PM

## 2019-12-08 ENCOUNTER — Other Ambulatory Visit: Payer: Self-pay | Admitting: Orthopedic Surgery

## 2019-12-09 ENCOUNTER — Other Ambulatory Visit: Payer: Self-pay

## 2019-12-09 ENCOUNTER — Encounter
Admission: RE | Admit: 2019-12-09 | Discharge: 2019-12-09 | Disposition: A | Discharge status: Home / Self Care | Payer: Medicare Other | Source: Ambulatory Visit | Attending: Orthopedic Surgery | Admitting: Orthopedic Surgery

## 2019-12-09 DIAGNOSIS — Z01812 Encounter for preprocedural laboratory examination: Secondary | ICD-10-CM | POA: Insufficient documentation

## 2019-12-09 NOTE — Patient Instructions (Signed)
Your procedure is scheduled on: Wednesday 12/21/19.  Report to DAY SURGERY DEPARTMENT LOCATED ON 2ND FLOOR MEDICAL MALL ENTRANCE. To find out your arrival time please call [phone] between 1PM - 3PM on Tuesday 12/20/19.  Remember: Instructions that are not followed completely may result in serious medical risk, up to and including death, or upon the discretion of your surgeon and anesthesiologist your surgery may need to be rescheduled.      _X__ 1. Do not eat food after midnight the night before your procedure.                 No gum chewing or hard candies. You may drink clear liquids up to 2 hours                 before you are scheduled to arrive for your surgery- DO NOT drink clear                 liquids within 2 hours of the start of your surgery.                 Clear Liquids include:  water, apple juice without pulp, clear carbohydrate                 drink such as Clearfast or Gatorade, Black Coffee or Tea (Do not add                 anything to coffee or tea).    __X__2.  On the morning of surgery brush your teeth with toothpaste and water, you may rinse your mouth with mouthwash if you wish.  Do not swallow any toothpaste or mouthwash.       _X__ 3.  No Alcohol for 24 hours before or after surgery.     _X__ 4.  Do Not Smoke or use e-cigarettes For 24 Hours Prior to Your Surgery.                 Do not use any chewable tobacco products for at least 6 hours prior to                 surgery.    __X__5.  Notify your doctor if there is any change in your medical condition      (cold, fever, infections).       Do not wear jewelry, make-up, hairpins, clips or nail polish. Do not wear lotions, powders, or perfumes.  Do not shave 48 hours prior to surgery. Men may shave face and neck. Do not bring valuables to the hospital.     [facility] is not responsible for any belongings or valuables.  Contacts, dentures/partials or body piercings may not be worn into surgery.  Bring a case for your contacts, glasses or hearing aids, a denture cup will be supplied.    Patients discharged the day of surgery will not be allowed to drive home.     __X__ Take these medicines the morning of surgery with A SIP OF WATER:     1. albuterol (PROVENTIL HFA;VENTOLIN HFA)   2. citalopram (CELEXA)  3. lacosamide (VIMPAT)  4. metoprolol succinate (TOPROL-XL)  5. omeprazole (PRILOSEC)  6. rosuvastatin (CRESTOR): IF YOU NORMALLY TAKE THIS IN THE MORNING     __X__ Use CHG Soap as directed    _ X___ Use inhalers on the day of surgery. Also bring the inhaler with you to the hospital on the morning of surgery.    __X__ Stop Anti-inflammatories  7 days before surgery such as Advil, Ibuprofen, Motrin, BC or Goodies Powder, Naprosyn, Naproxen, Aleve, Aspirin, Meloxicam. May take Tylenol if needed for pain or discomfort.     __X__ Don't start taking any new herbal supplements or vitamins prior to surgery.

## 2019-12-13 ENCOUNTER — Encounter
Admission: RE | Admit: 2019-12-13 | Discharge: 2019-12-13 | Disposition: A | Discharge status: Home / Self Care | Payer: Medicare Other | Source: Ambulatory Visit | Attending: Orthopedic Surgery | Admitting: Orthopedic Surgery

## 2019-12-13 ENCOUNTER — Other Ambulatory Visit: Payer: Self-pay

## 2019-12-13 DIAGNOSIS — I110 Hypertensive heart disease with heart failure: Secondary | ICD-10-CM | POA: Insufficient documentation

## 2019-12-13 DIAGNOSIS — I509 Heart failure, unspecified: Secondary | ICD-10-CM | POA: Insufficient documentation

## 2019-12-13 DIAGNOSIS — Z8673 Personal history of transient ischemic attack (TIA), and cerebral infarction without residual deficits: Secondary | ICD-10-CM | POA: Insufficient documentation

## 2019-12-13 DIAGNOSIS — Z79899 Other long term (current) drug therapy: Secondary | ICD-10-CM | POA: Insufficient documentation

## 2019-12-13 DIAGNOSIS — Z01818 Encounter for other preprocedural examination: Secondary | ICD-10-CM | POA: Diagnosis present

## 2019-12-13 DIAGNOSIS — G4730 Sleep apnea, unspecified: Secondary | ICD-10-CM | POA: Insufficient documentation

## 2019-12-13 DIAGNOSIS — F17210 Nicotine dependence, cigarettes, uncomplicated: Secondary | ICD-10-CM | POA: Diagnosis not present

## 2019-12-13 DIAGNOSIS — K219 Gastro-esophageal reflux disease without esophagitis: Secondary | ICD-10-CM | POA: Diagnosis not present

## 2019-12-13 DIAGNOSIS — E119 Type 2 diabetes mellitus without complications: Secondary | ICD-10-CM | POA: Insufficient documentation

## 2019-12-13 DIAGNOSIS — M19011 Primary osteoarthritis, right shoulder: Secondary | ICD-10-CM | POA: Diagnosis not present

## 2019-12-13 DIAGNOSIS — J449 Chronic obstructive pulmonary disease, unspecified: Secondary | ICD-10-CM | POA: Insufficient documentation

## 2019-12-13 LAB — BASIC METABOLIC PANEL
Anion gap: 12 (ref 5–15)
BUN: 11 mg/dL (ref 8–23)
CO2: 30 mmol/L (ref 22–32)
Calcium: 8.8 mg/dL — ABNORMAL LOW (ref 8.9–10.3)
Chloride: 101 mmol/L (ref 98–111)
Creatinine, Ser: 0.52 mg/dL (ref 0.44–1.00)
GFR calc Af Amer: >60 mL/min (ref >60)
GFR calc non Af Amer: >60 mL/min (ref >60)
Glucose, Bld: 158 mg/dL — ABNORMAL HIGH (ref 70–99)
Potassium: 2.9 mmol/L — ABNORMAL LOW (ref 3.5–5.1)
Sodium: 143 mmol/L (ref 135–145)

## 2019-12-13 LAB — SURGICAL PCR SCREEN
MRSA, PCR: NEGATIVE
Staphylococcus aureus: POSITIVE — AB

## 2019-12-13 LAB — CBC
HCT: 43.5 % (ref 36.0–46.0)
Hemoglobin: 14.8 g/dL (ref 12.0–15.0)
MCH: 27.6 pg (ref 26.0–34.0)
MCHC: 34 g/dL (ref 30.0–36.0)
MCV: 81.2 fL (ref 80.0–100.0)
Platelets: 266 10*3/uL (ref 150–400)
RBC: 5.36 MIL/uL — ABNORMAL HIGH (ref 3.87–5.11)
RDW: 13.5 % (ref 11.5–15.5)
WBC: 7.5 10*3/uL (ref 4.0–10.5)
nRBC: 0 % (ref 0.0–0.2)

## 2019-12-13 LAB — URINALYSIS, ROUTINE W REFLEX MICROSCOPIC
Bilirubin Urine: NEGATIVE
Glucose, UA: NEGATIVE mg/dL
Hgb urine dipstick: NEGATIVE
Ketones, ur: NEGATIVE mg/dL
Leukocytes,Ua: NEGATIVE
Nitrite: NEGATIVE
Protein, ur: NEGATIVE mg/dL
Specific Gravity, Urine: 1.01 (ref 1.005–1.030)
pH: 5 (ref 5.0–8.0)

## 2019-12-13 LAB — PROTIME-INR
INR: 1 (ref 0.8–1.2)
Prothrombin Time: 12.9 seconds (ref 11.4–15.2)

## 2019-12-13 LAB — APTT: aPTT: 30 seconds (ref 24–36)

## 2019-12-13 NOTE — Pre-Procedure Instructions (Signed)
Abnormal lab results faxed to Dr. Harlow Mares office. Will need additional K+ dose prior to surgery. Order placed to recheck K+ level the day of surgery.

## 2019-12-19 ENCOUNTER — Other Ambulatory Visit
Admission: RE | Admit: 2019-12-19 | Discharge: 2019-12-19 | Disposition: A | Discharge status: Home / Self Care | Payer: Medicare Other | Source: Ambulatory Visit | Attending: Orthopedic Surgery | Admitting: Orthopedic Surgery

## 2019-12-19 ENCOUNTER — Other Ambulatory Visit: Payer: Self-pay

## 2019-12-19 ENCOUNTER — Other Ambulatory Visit: Payer: Self-pay | Admitting: Orthopedic Surgery

## 2019-12-19 DIAGNOSIS — Z01812 Encounter for preprocedural laboratory examination: Secondary | ICD-10-CM | POA: Insufficient documentation

## 2019-12-19 DIAGNOSIS — Z20822 Contact with and (suspected) exposure to covid-19: Secondary | ICD-10-CM | POA: Insufficient documentation

## 2019-12-19 LAB — SARS CORONAVIRUS 2 (TAT 6-24 HRS): SARS Coronavirus 2: NEGATIVE

## 2019-12-21 ENCOUNTER — Encounter
Admission: RE | Disposition: A | Discharge status: Home / Self Care | Payer: Self-pay | Source: Home / Self Care | Attending: Orthopedic Surgery

## 2019-12-21 ENCOUNTER — Inpatient Hospital Stay
Admission: RE | Admit: 2019-12-21 | Discharge: 2019-12-24 | DRG: 483 | Disposition: A | Discharge status: Home Health | Payer: Medicare Other | Attending: Orthopedic Surgery | Admitting: Orthopedic Surgery

## 2019-12-21 ENCOUNTER — Inpatient Hospital Stay: Payer: Medicare Other

## 2019-12-21 ENCOUNTER — Encounter: Payer: Self-pay | Admitting: Orthopedic Surgery

## 2019-12-21 ENCOUNTER — Other Ambulatory Visit: Payer: Self-pay

## 2019-12-21 ENCOUNTER — Inpatient Hospital Stay: Payer: Medicare Other | Admitting: Anesthesiology

## 2019-12-21 DIAGNOSIS — Z886 Allergy status to analgesic agent status: Secondary | ICD-10-CM | POA: Diagnosis not present

## 2019-12-21 DIAGNOSIS — Z885 Allergy status to narcotic agent status: Secondary | ICD-10-CM | POA: Diagnosis not present

## 2019-12-21 DIAGNOSIS — E876 Hypokalemia: Secondary | ICD-10-CM | POA: Diagnosis not present

## 2019-12-21 DIAGNOSIS — J9611 Chronic respiratory failure with hypoxia: Secondary | ICD-10-CM | POA: Diagnosis present

## 2019-12-21 DIAGNOSIS — R569 Unspecified convulsions: Secondary | ICD-10-CM

## 2019-12-21 DIAGNOSIS — E119 Type 2 diabetes mellitus without complications: Secondary | ICD-10-CM | POA: Diagnosis present

## 2019-12-21 DIAGNOSIS — Z20822 Contact with and (suspected) exposure to covid-19: Secondary | ICD-10-CM | POA: Diagnosis present

## 2019-12-21 DIAGNOSIS — Z9981 Dependence on supplemental oxygen: Secondary | ICD-10-CM

## 2019-12-21 DIAGNOSIS — F17210 Nicotine dependence, cigarettes, uncomplicated: Secondary | ICD-10-CM | POA: Diagnosis present

## 2019-12-21 DIAGNOSIS — M19011 Primary osteoarthritis, right shoulder: Principal | ICD-10-CM | POA: Diagnosis present

## 2019-12-21 DIAGNOSIS — K7460 Unspecified cirrhosis of liver: Secondary | ICD-10-CM | POA: Diagnosis present

## 2019-12-21 DIAGNOSIS — J449 Chronic obstructive pulmonary disease, unspecified: Secondary | ICD-10-CM | POA: Diagnosis present

## 2019-12-21 DIAGNOSIS — Z881 Allergy status to other antibiotic agents status: Secondary | ICD-10-CM | POA: Diagnosis not present

## 2019-12-21 DIAGNOSIS — G4730 Sleep apnea, unspecified: Secondary | ICD-10-CM | POA: Diagnosis present

## 2019-12-21 DIAGNOSIS — I5030 Unspecified diastolic (congestive) heart failure: Secondary | ICD-10-CM | POA: Diagnosis present

## 2019-12-21 DIAGNOSIS — H9193 Unspecified hearing loss, bilateral: Secondary | ICD-10-CM | POA: Diagnosis present

## 2019-12-21 DIAGNOSIS — Z7902 Long term (current) use of antithrombotics/antiplatelets: Secondary | ICD-10-CM

## 2019-12-21 DIAGNOSIS — Z96611 Presence of right artificial shoulder joint: Secondary | ICD-10-CM | POA: Diagnosis not present

## 2019-12-21 DIAGNOSIS — I10 Essential (primary) hypertension: Secondary | ICD-10-CM | POA: Diagnosis not present

## 2019-12-21 DIAGNOSIS — K219 Gastro-esophageal reflux disease without esophagitis: Secondary | ICD-10-CM | POA: Diagnosis present

## 2019-12-21 DIAGNOSIS — I110 Hypertensive heart disease with heart failure: Secondary | ICD-10-CM | POA: Diagnosis present

## 2019-12-21 DIAGNOSIS — Z882 Allergy status to sulfonamides status: Secondary | ICD-10-CM

## 2019-12-21 DIAGNOSIS — Z8673 Personal history of transient ischemic attack (TIA), and cerebral infarction without residual deficits: Secondary | ICD-10-CM | POA: Diagnosis not present

## 2019-12-21 DIAGNOSIS — Z419 Encounter for procedure for purposes other than remedying health state, unspecified: Secondary | ICD-10-CM

## 2019-12-21 DIAGNOSIS — Z880 Allergy status to penicillin: Secondary | ICD-10-CM | POA: Diagnosis not present

## 2019-12-21 DIAGNOSIS — G40909 Epilepsy, unspecified, not intractable, without status epilepticus: Secondary | ICD-10-CM | POA: Diagnosis present

## 2019-12-21 DIAGNOSIS — F329 Major depressive disorder, single episode, unspecified: Secondary | ICD-10-CM | POA: Diagnosis present

## 2019-12-21 DIAGNOSIS — Z79899 Other long term (current) drug therapy: Secondary | ICD-10-CM

## 2019-12-21 DIAGNOSIS — Z9181 History of falling: Secondary | ICD-10-CM

## 2019-12-21 DIAGNOSIS — Z09 Encounter for follow-up examination after completed treatment for conditions other than malignant neoplasm: Secondary | ICD-10-CM

## 2019-12-21 HISTORY — PX: REVERSE SHOULDER ARTHROPLASTY: SHX5054

## 2019-12-21 LAB — GLUCOSE, CAPILLARY
Glucose-Capillary: 210 mg/dL — ABNORMAL HIGH (ref 70–99)
Glucose-Capillary: 313 mg/dL — ABNORMAL HIGH (ref 70–99)

## 2019-12-21 LAB — POCT I-STAT, CHEM 8
BUN: 29 mg/dL — ABNORMAL HIGH (ref 8–23)
Calcium, Ion: 0.86 mmol/L — CL (ref 1.15–1.40)
Chloride: 105 mmol/L (ref 98–111)
Creatinine, Ser: 2.2 mg/dL — ABNORMAL HIGH (ref 0.44–1.00)
Glucose, Bld: 143 mg/dL — ABNORMAL HIGH (ref 70–99)
HCT: 36 % (ref 36.0–46.0)
Hemoglobin: 12.2 g/dL (ref 12.0–15.0)
Potassium: 3.3 mmol/L — ABNORMAL LOW (ref 3.5–5.1)
Sodium: 140 mmol/L (ref 135–145)
TCO2: 27 mmol/L (ref 22–32)

## 2019-12-21 LAB — SURGICAL PCR SCREEN
MRSA, PCR: NEGATIVE
Staphylococcus aureus: POSITIVE — AB

## 2019-12-21 SURGERY — ARTHROPLASTY, SHOULDER, TOTAL, REVERSE
Anesthesia: General | Site: Shoulder | Laterality: Right

## 2019-12-21 MED ORDER — EPINEPHRINE PF 1 MG/ML IJ SOLN
INTRAMUSCULAR | Status: AC
  Filled 2019-12-21: qty 1

## 2019-12-21 MED ORDER — LORAZEPAM 2 MG/ML IJ SOLN
2.0000 mg | Freq: Once | INTRAMUSCULAR | Status: AC
  Administered 2019-12-21: 2 mg via INTRAVENOUS

## 2019-12-21 MED ORDER — ACETAMINOPHEN 325 MG PO TABS: 325.0000 mg | ORAL_TABLET | Freq: Four times a day (QID) | ORAL | Status: DC | PRN

## 2019-12-21 MED ORDER — HYDROMORPHONE HCL 1 MG/ML IJ SOLN
INTRAMUSCULAR | Status: AC
  Filled 2019-12-21: qty 1

## 2019-12-21 MED ORDER — FENTANYL CITRATE (PF) 100 MCG/2ML IJ SOLN
INTRAMUSCULAR | Status: AC
  Filled 2019-12-21: qty 2

## 2019-12-21 MED ORDER — VASOPRESSIN 20 UNIT/ML IV SOLN
INTRAVENOUS | Status: AC
  Filled 2019-12-21: qty 1

## 2019-12-21 MED ORDER — TRANEXAMIC ACID-NACL 1000-0.7 MG/100ML-% IV SOLN
1000.0000 mg | INTRAVENOUS | Status: AC
  Administered 2019-12-21: 1000 mg via INTRAVENOUS

## 2019-12-21 MED ORDER — POLYETHYLENE GLYCOL 3350 17 G PO PACK
17.0000 g | PACK | Freq: Every day | ORAL | Status: DC | PRN
  Filled 2019-12-21: qty 1

## 2019-12-21 MED ORDER — LACOSAMIDE 50 MG PO TABS
50.0000 mg | ORAL_TABLET | Freq: Two times a day (BID) | ORAL | Status: DC
  Administered 2019-12-21 – 2019-12-23 (×5): 50 mg via ORAL
  Filled 2019-12-21 (×4): qty 1

## 2019-12-21 MED ORDER — HYDROCODONE-ACETAMINOPHEN 5-325 MG PO TABS
1.0000 | ORAL_TABLET | ORAL | Status: DC | PRN
  Administered 2019-12-22: 1 via ORAL
  Administered 2019-12-22 – 2019-12-23 (×3): 2 via ORAL
  Filled 2019-12-21: qty 1
  Filled 2019-12-21 (×3): qty 2

## 2019-12-21 MED ORDER — DOCUSATE SODIUM 100 MG PO CAPS
100.0000 mg | ORAL_CAPSULE | Freq: Two times a day (BID) | ORAL | Status: DC
  Administered 2019-12-21 – 2019-12-24 (×7): 100 mg via ORAL
  Filled 2019-12-21 (×6): qty 1

## 2019-12-21 MED ORDER — LACTATED RINGERS IV SOLN: INTRAVENOUS | Status: DC

## 2019-12-21 MED ORDER — TRANEXAMIC ACID-NACL 1000-0.7 MG/100ML-% IV SOLN
INTRAVENOUS | Status: AC
  Filled 2019-12-21: qty 100

## 2019-12-21 MED ORDER — BISACODYL 10 MG RE SUPP: 10.0000 mg | Freq: Every day | RECTAL | Status: DC | PRN

## 2019-12-21 MED ORDER — VASOPRESSIN 20 UNIT/ML IV SOLN
INTRAVENOUS | Status: DC | PRN
Start: 2019-12-21 — End: 2019-12-21
  Administered 2019-12-21: 2 [IU] via INTRAVENOUS

## 2019-12-21 MED ORDER — ALBUTEROL SULFATE (2.5 MG/3ML) 0.083% IN NEBU: 2.5000 mg | INHALATION_SOLUTION | RESPIRATORY_TRACT | Status: DC | PRN

## 2019-12-21 MED ORDER — ORAL CARE MOUTH RINSE: 15.0000 mL | Freq: Once | OROMUCOSAL | Status: AC

## 2019-12-21 MED ORDER — DEXAMETHASONE SODIUM PHOSPHATE 10 MG/ML IJ SOLN
INTRAMUSCULAR | Status: DC | PRN
  Administered 2019-12-21: 10 mg via INTRAVENOUS

## 2019-12-21 MED ORDER — ALBUTEROL SULFATE HFA 108 (90 BASE) MCG/ACT IN AERS: 2.0000 | INHALATION_SPRAY | RESPIRATORY_TRACT | Status: DC | PRN

## 2019-12-21 MED ORDER — CLOPIDOGREL BISULFATE 75 MG PO TABS
75.0000 mg | ORAL_TABLET | Freq: Every day | ORAL | Status: DC
  Administered 2019-12-21 – 2019-12-24 (×4): 75 mg via ORAL
  Filled 2019-12-21 (×4): qty 1

## 2019-12-21 MED ORDER — CHLORHEXIDINE GLUCONATE 0.12 % MT SOLN: 15.0000 mL | Freq: Once | OROMUCOSAL | Status: AC

## 2019-12-21 MED ORDER — MENTHOL 3 MG MT LOZG: 1.0000 | LOZENGE | OROMUCOSAL | Status: DC | PRN

## 2019-12-21 MED ORDER — ONDANSETRON HCL 4 MG PO TABS: 4.0000 mg | ORAL_TABLET | Freq: Four times a day (QID) | ORAL | Status: DC | PRN

## 2019-12-21 MED ORDER — MORPHINE SULFATE (PF) 2 MG/ML IV SOLN: 0.5000 mg | INTRAVENOUS | Status: DC | PRN

## 2019-12-21 MED ORDER — MIDAZOLAM HCL 2 MG/2ML IJ SOLN
INTRAMUSCULAR | Status: AC
  Filled 2019-12-21: qty 2

## 2019-12-21 MED ORDER — PANTOPRAZOLE SODIUM 40 MG PO TBEC
40.0000 mg | DELAYED_RELEASE_TABLET | Freq: Every day | ORAL | Status: DC
  Administered 2019-12-22 – 2019-12-24 (×3): 40 mg via ORAL
  Filled 2019-12-21 (×3): qty 1

## 2019-12-21 MED ORDER — ACETAMINOPHEN 500 MG PO TABS
500.0000 mg | ORAL_TABLET | Freq: Four times a day (QID) | ORAL | Status: AC
  Administered 2019-12-21 – 2019-12-22 (×4): 500 mg via ORAL
  Filled 2019-12-21 (×4): qty 1

## 2019-12-21 MED ORDER — EPHEDRINE 5 MG/ML INJ
INTRAVENOUS | Status: AC
  Filled 2019-12-21: qty 10

## 2019-12-21 MED ORDER — DEXMEDETOMIDINE HCL IN NACL 80 MCG/20ML IV SOLN
INTRAVENOUS | Status: AC
  Filled 2019-12-21: qty 20

## 2019-12-21 MED ORDER — ACETAMINOPHEN 10 MG/ML IV SOLN
INTRAVENOUS | Status: DC | PRN
  Administered 2019-12-21: 1000 mg via INTRAVENOUS

## 2019-12-21 MED ORDER — ONDANSETRON HCL 4 MG/2ML IJ SOLN: 4.0000 mg | Freq: Once | INTRAMUSCULAR | Status: DC | PRN

## 2019-12-21 MED ORDER — CLINDAMYCIN PHOSPHATE 900 MG/50ML IV SOLN
INTRAVENOUS | Status: AC
  Filled 2019-12-21: qty 50

## 2019-12-21 MED ORDER — LORAZEPAM 2 MG/ML IJ SOLN
INTRAMUSCULAR | Status: AC
  Filled 2019-12-21: qty 1

## 2019-12-21 MED ORDER — HYDROMORPHONE HCL 1 MG/ML IJ SOLN
INTRAMUSCULAR | Status: DC | PRN
  Administered 2019-12-21: .5 mg via INTRAVENOUS
  Administered 2019-12-21 (×2): .25 mg via INTRAVENOUS

## 2019-12-21 MED ORDER — LABETALOL HCL 5 MG/ML IV SOLN
INTRAVENOUS | Status: DC | PRN
  Administered 2019-12-21: 10 mg via INTRAVENOUS

## 2019-12-21 MED ORDER — HYDROMORPHONE HCL 1 MG/ML IJ SOLN: 0.5000 mg | INTRAMUSCULAR | Status: DC | PRN

## 2019-12-21 MED ORDER — SODIUM CHLORIDE 0.9 % IR SOLN
Status: DC | PRN
  Administered 2019-12-21: 1000 mL

## 2019-12-21 MED ORDER — ACETAMINOPHEN 10 MG/ML IV SOLN: 1000.0000 mg | Freq: Once | INTRAVENOUS | Status: DC | PRN

## 2019-12-21 MED ORDER — DEXMEDETOMIDINE HCL IN NACL 200 MCG/50ML IV SOLN
INTRAVENOUS | Status: DC | PRN
  Administered 2019-12-21: 8 ug via INTRAVENOUS
  Administered 2019-12-21: 12 ug via INTRAVENOUS

## 2019-12-21 MED ORDER — GLYCOPYRROLATE 0.2 MG/ML IJ SOLN
INTRAMUSCULAR | Status: AC
  Filled 2019-12-21: qty 4

## 2019-12-21 MED ORDER — OXYCODONE HCL 5 MG/5ML PO SOLN: 5.0000 mg | Freq: Once | ORAL | Status: DC | PRN

## 2019-12-21 MED ORDER — PROPOFOL 500 MG/50ML IV EMUL
INTRAVENOUS | Status: AC
  Filled 2019-12-21: qty 100

## 2019-12-21 MED ORDER — ONDANSETRON HCL 4 MG/2ML IJ SOLN
INTRAMUSCULAR | Status: AC
  Filled 2019-12-21: qty 16

## 2019-12-21 MED ORDER — ROCURONIUM BROMIDE 100 MG/10ML IV SOLN
INTRAVENOUS | Status: DC | PRN
  Administered 2019-12-21: 50 mg via INTRAVENOUS
  Administered 2019-12-21: 20 mg via INTRAVENOUS

## 2019-12-21 MED ORDER — HYDROMORPHONE HCL 1 MG/ML IJ SOLN
INTRAMUSCULAR | Status: AC
  Administered 2019-12-21: 0.5 mg via INTRAVENOUS
  Filled 2019-12-21: qty 1

## 2019-12-21 MED ORDER — BACITRACIN 50000 UNITS IM SOLR
INTRAMUSCULAR | Status: AC
  Filled 2019-12-21: qty 2

## 2019-12-21 MED ORDER — CLINDAMYCIN PHOSPHATE 600 MG/50ML IV SOLN
600.0000 mg | Freq: Four times a day (QID) | INTRAVENOUS | Status: AC
  Administered 2019-12-21 – 2019-12-22 (×3): 600 mg via INTRAVENOUS
  Filled 2019-12-21 (×3): qty 50

## 2019-12-21 MED ORDER — FUROSEMIDE 40 MG PO TABS
40.0000 mg | ORAL_TABLET | Freq: Every day | ORAL | Status: DC
  Administered 2019-12-21 – 2019-12-24 (×4): 40 mg via ORAL
  Filled 2019-12-21 (×4): qty 1

## 2019-12-21 MED ORDER — FENTANYL CITRATE (PF) 100 MCG/2ML IJ SOLN
INTRAMUSCULAR | Status: DC | PRN
  Administered 2019-12-21 (×4): 50 ug via INTRAVENOUS

## 2019-12-21 MED ORDER — PROPOFOL 10 MG/ML IV BOLUS
INTRAVENOUS | Status: DC | PRN
  Administered 2019-12-21: 120 mg via INTRAVENOUS

## 2019-12-21 MED ORDER — OXYCODONE HCL 5 MG PO TABS: 5.0000 mg | ORAL_TABLET | Freq: Once | ORAL | Status: DC | PRN

## 2019-12-21 MED ORDER — FENTANYL CITRATE (PF) 100 MCG/2ML IJ SOLN: 25.0000 ug | INTRAMUSCULAR | Status: DC | PRN

## 2019-12-21 MED ORDER — METOPROLOL SUCCINATE ER 25 MG PO TB24
25.0000 mg | ORAL_TABLET | Freq: Every day | ORAL | Status: DC
  Administered 2019-12-22 – 2019-12-24 (×3): 25 mg via ORAL
  Filled 2019-12-21 (×3): qty 1

## 2019-12-21 MED ORDER — HYDROMORPHONE HCL 1 MG/ML IJ SOLN
0.5000 mg | INTRAMUSCULAR | Status: AC | PRN
  Administered 2019-12-21 (×2): 0.5 mg via INTRAVENOUS

## 2019-12-21 MED ORDER — CHLORHEXIDINE GLUCONATE 0.12 % MT SOLN
OROMUCOSAL | Status: AC
  Administered 2019-12-21: 15 mL via OROMUCOSAL
  Filled 2019-12-21: qty 15

## 2019-12-21 MED ORDER — BUPIVACAINE-EPINEPHRINE (PF) 0.25% -1:200000 IJ SOLN
INTRAMUSCULAR | Status: DC | PRN
  Administered 2019-12-21: 20 mL

## 2019-12-21 MED ORDER — LORAZEPAM 2 MG/ML IJ SOLN
2.0000 mg | INTRAMUSCULAR | Status: DC | PRN
  Administered 2019-12-22 (×2): 2 mg via INTRAVENOUS
  Filled 2019-12-21 (×3): qty 1

## 2019-12-21 MED ORDER — PROPOFOL 10 MG/ML IV BOLUS
INTRAVENOUS | Status: AC
  Filled 2019-12-21: qty 20

## 2019-12-21 MED ORDER — ALBUTEROL SULFATE HFA 108 (90 BASE) MCG/ACT IN AERS
INHALATION_SPRAY | RESPIRATORY_TRACT | Status: DC | PRN
  Administered 2019-12-21 (×2): 4 via RESPIRATORY_TRACT

## 2019-12-21 MED ORDER — DEXAMETHASONE SODIUM PHOSPHATE 10 MG/ML IJ SOLN
INTRAMUSCULAR | Status: AC
  Filled 2019-12-21: qty 4

## 2019-12-21 MED ORDER — LOSARTAN POTASSIUM 50 MG PO TABS
50.0000 mg | ORAL_TABLET | Freq: Every day | ORAL | Status: DC
  Administered 2019-12-22 – 2019-12-24 (×3): 50 mg via ORAL
  Filled 2019-12-21 (×2): qty 1

## 2019-12-21 MED ORDER — ONDANSETRON HCL 4 MG/2ML IJ SOLN: 4.0000 mg | Freq: Four times a day (QID) | INTRAMUSCULAR | Status: DC | PRN

## 2019-12-21 MED ORDER — GLYCOPYRROLATE 0.2 MG/ML IJ SOLN
INTRAMUSCULAR | Status: DC | PRN
  Administered 2019-12-21: .2 mg via INTRAVENOUS

## 2019-12-21 MED ORDER — CITALOPRAM HYDROBROMIDE 10 MG PO TABS
10.0000 mg | ORAL_TABLET | Freq: Every day | ORAL | Status: DC
  Administered 2019-12-22 – 2019-12-24 (×3): 10 mg via ORAL
  Filled 2019-12-21 (×4): qty 1

## 2019-12-21 MED ORDER — SODIUM CHLORIDE FLUSH 0.9 % IV SOLN
INTRAVENOUS | Status: AC
  Filled 2019-12-21: qty 20

## 2019-12-21 MED ORDER — METOCLOPRAMIDE HCL 5 MG/ML IJ SOLN: 5.0000 mg | Freq: Three times a day (TID) | INTRAMUSCULAR | Status: DC | PRN

## 2019-12-21 MED ORDER — POTASSIUM CHLORIDE CRYS ER 20 MEQ PO TBCR
20.0000 meq | EXTENDED_RELEASE_TABLET | Freq: Every day | ORAL | Status: DC
  Administered 2019-12-21 – 2019-12-24 (×4): 20 meq via ORAL
  Filled 2019-12-21 (×4): qty 1

## 2019-12-21 MED ORDER — FENTANYL CITRATE (PF) 100 MCG/2ML IJ SOLN: 50.0000 ug | Freq: Once | INTRAMUSCULAR | Status: DC

## 2019-12-21 MED ORDER — ROSUVASTATIN CALCIUM 10 MG PO TABS
10.0000 mg | ORAL_TABLET | Freq: Every day | ORAL | Status: DC
  Administered 2019-12-22 – 2019-12-24 (×3): 10 mg via ORAL
  Filled 2019-12-21 (×3): qty 1

## 2019-12-21 MED ORDER — LIDOCAINE HCL (PF) 2 % IJ SOLN
INTRAMUSCULAR | Status: AC
  Filled 2019-12-21: qty 10

## 2019-12-21 MED ORDER — ACETAMINOPHEN 10 MG/ML IV SOLN
INTRAVENOUS | Status: AC
  Filled 2019-12-21: qty 100

## 2019-12-21 MED ORDER — SUGAMMADEX SODIUM 200 MG/2ML IV SOLN
INTRAVENOUS | Status: DC | PRN
  Administered 2019-12-21: 200 mg via INTRAVENOUS

## 2019-12-21 MED ORDER — ROCURONIUM BROMIDE 10 MG/ML (PF) SYRINGE
PREFILLED_SYRINGE | INTRAVENOUS | Status: AC
  Filled 2019-12-21: qty 10

## 2019-12-21 MED ORDER — ONDANSETRON HCL 4 MG/2ML IJ SOLN
INTRAMUSCULAR | Status: DC | PRN
  Administered 2019-12-21: 4 mg via INTRAVENOUS

## 2019-12-21 MED ORDER — LIDOCAINE HCL (CARDIAC) PF 100 MG/5ML IV SOSY
PREFILLED_SYRINGE | INTRAVENOUS | Status: DC | PRN
  Administered 2019-12-21: 60 mg via INTRAVENOUS

## 2019-12-21 MED ORDER — EPHEDRINE SULFATE 50 MG/ML IJ SOLN
INTRAMUSCULAR | Status: DC | PRN
  Administered 2019-12-21: 10 mg via INTRAVENOUS
  Administered 2019-12-21 (×3): 5 mg via INTRAVENOUS

## 2019-12-21 MED ORDER — MIDAZOLAM HCL 2 MG/2ML IJ SOLN: 1.0000 mg | Freq: Once | INTRAMUSCULAR | Status: DC

## 2019-12-21 MED ORDER — PHENOL 1.4 % MT LIQD: 1.0000 | OROMUCOSAL | Status: DC | PRN

## 2019-12-21 MED ORDER — MONTELUKAST SODIUM 10 MG PO TABS
10.0000 mg | ORAL_TABLET | Freq: Every day | ORAL | Status: DC
  Administered 2019-12-21 – 2019-12-23 (×4): 10 mg via ORAL
  Filled 2019-12-21 (×3): qty 1

## 2019-12-21 MED ORDER — CLINDAMYCIN PHOSPHATE 900 MG/50ML IV SOLN
900.0000 mg | INTRAVENOUS | Status: AC
  Administered 2019-12-21: 900 mg via INTRAVENOUS

## 2019-12-21 MED ORDER — HYDROCODONE-ACETAMINOPHEN 7.5-325 MG PO TABS
ORAL_TABLET | ORAL | Status: AC
  Administered 2019-12-21: 2 via ORAL
  Filled 2019-12-21: qty 2

## 2019-12-21 MED ORDER — METOCLOPRAMIDE HCL 10 MG PO TABS: 5.0000 mg | ORAL_TABLET | Freq: Three times a day (TID) | ORAL | Status: DC | PRN

## 2019-12-21 MED ORDER — BUPIVACAINE HCL (PF) 0.25 % IJ SOLN
INTRAMUSCULAR | Status: AC
  Filled 2019-12-21: qty 30

## 2019-12-21 MED ORDER — HYDROCODONE-ACETAMINOPHEN 7.5-325 MG PO TABS
1.0000 | ORAL_TABLET | ORAL | Status: DC | PRN
  Administered 2019-12-21 – 2019-12-24 (×4): 2 via ORAL
  Filled 2019-12-21 (×4): qty 2

## 2019-12-21 SURGICAL SUPPLY — 75 items
APL PRP STRL LF DISP 70% ISPRP (MISCELLANEOUS) ×2
BIT DRILL 170X2.5X (BIT) IMPLANT
BIT DRL 170X2.5X (BIT) ×1
BLADE BOVIE TIP EXT 4 (BLADE) ×3 IMPLANT
BLADE SAW 1 (BLADE) ×3 IMPLANT
BLADE SAW 90X25X1.19 OSCILLAT (BLADE) ×2 IMPLANT
BNDG COHESIVE 4X5 TAN STRL (GAUZE/BANDAGES/DRESSINGS) ×3 IMPLANT
BRUSH SCRUB EZ  4% CHG (MISCELLANEOUS) ×6
BRUSH SCRUB EZ 4% CHG (MISCELLANEOUS) ×2 IMPLANT
CANISTER SUCT 1200ML W/VALVE (MISCELLANEOUS) ×3 IMPLANT
CANISTER SUCT 3000ML PPV (MISCELLANEOUS) ×6 IMPLANT
CHLORAPREP W/TINT 26 (MISCELLANEOUS) ×6 IMPLANT
CLOSURE WOUND 1/2 X4 (GAUZE/BANDAGES/DRESSINGS)
COVER BACK TABLE REUSABLE LG (DRAPES) ×3 IMPLANT
COVER WAND RF STERILE (DRAPES) ×3 IMPLANT
CRADLE LAMINECT ARM (MISCELLANEOUS) ×6 IMPLANT
DRAPE 3/4 80X56 (DRAPES) ×6 IMPLANT
DRAPE INCISE IOBAN 66X60 STRL (DRAPES) IMPLANT
DRAPE U-SHAPE 47X51 STRL (DRAPES) ×3 IMPLANT
DRILL 2.5 (BIT) ×3
ELECT REM PT RETURN 9FT ADLT (ELECTROSURGICAL) ×3
ELECTRODE REM PT RTRN 9FT ADLT (ELECTROSURGICAL) ×1 IMPLANT
GAUZE SPONGE 4X4 12PLY STRL (GAUZE/BANDAGES/DRESSINGS) ×3 IMPLANT
GAUZE XEROFORM 1X8 LF (GAUZE/BANDAGES/DRESSINGS) ×3 IMPLANT
GLENOSPHERE DELTA XTEND LAT 38 (Miscellaneous) ×2 IMPLANT
GLOVE INDICATOR 8.0 STRL GRN (GLOVE) ×3 IMPLANT
GLOVE SURG ORTHO 8.0 STRL STRW (GLOVE) ×6 IMPLANT
GOWN STRL REUS W/ TWL LRG LVL3 (GOWN DISPOSABLE) ×1 IMPLANT
GOWN STRL REUS W/ TWL XL LVL3 (GOWN DISPOSABLE) ×1 IMPLANT
GOWN STRL REUS W/TWL LRG LVL3 (GOWN DISPOSABLE) ×3
GOWN STRL REUS W/TWL XL LVL3 (GOWN DISPOSABLE) ×3
HOOD PEEL AWAY FLYTE STAYCOOL (MISCELLANEOUS) ×9 IMPLANT
IV NS 1000ML (IV SOLUTION) ×3
IV NS 1000ML BAXH (IV SOLUTION) ×1 IMPLANT
KIT STABILIZATION SHOULDER (MISCELLANEOUS) ×3 IMPLANT
KIT TURNOVER KIT A (KITS) ×3 IMPLANT
MASK FACE SPIDER DISP (MASK) ×3 IMPLANT
MAT ABSORB  FLUID 56X50 GRAY (MISCELLANEOUS) ×3
MAT ABSORB FLUID 56X50 GRAY (MISCELLANEOUS) ×1 IMPLANT
METAGLENE DELTA EXTEND (Trauma) IMPLANT
METAGLENE DXTEND (Trauma) ×3 IMPLANT
NDL MAYO 6 CRC TAPER PT (NEEDLE) IMPLANT
NDL MAYO CATGUT SZ5 (NEEDLE)
NDL SAFETY ECLIPSE 18X1.5 (NEEDLE) ×1 IMPLANT
NDL SUT 5 .5 CRC TPR PNT MAYO (NEEDLE) IMPLANT
NEEDLE HYPO 18GX1.5 SHARP (NEEDLE) ×3
NEEDLE MAYO 6 CRC TAPER PT (NEEDLE) IMPLANT
NS IRRIG 1000ML POUR BTL (IV SOLUTION) ×3 IMPLANT
PACK SHDR ARTHRO (MISCELLANEOUS) ×3 IMPLANT
PAD ABD DERMACEA PRESS 5X9 (GAUZE/BANDAGES/DRESSINGS) ×3 IMPLANT
PIN FIXATION 1/8DIA X 3INL (PIN) ×2 IMPLANT
PIN GUIDE 1.2 (PIN) ×2 IMPLANT
PIN GUIDE GLENOPHERE 1.5MX300M (PIN) ×2 IMPLANT
PIN METAGLENE 2.5 (PIN) ×2 IMPLANT
PULSAVAC PLUS IRRIG FAN TIP (DISPOSABLE) ×3
SCREW 4.5X18MM (Screw) ×6 IMPLANT
SCREW BN 18X4.5XSTRL SHLDR (Screw) IMPLANT
SCREW LOCK 42 (Screw) ×4 IMPLANT
SLING ARM LRG DEEP (SOFTGOODS) ×3 IMPLANT
SPACER 38 PLUS 3 (Spacer) ×2 IMPLANT
SPONGE LAP 18X18 RF (DISPOSABLE) ×3 IMPLANT
STAPLER SKIN PROX 35W (STAPLE) ×3 IMPLANT
STEM EPIPHYSIS SHOULDER (Stem) ×2 IMPLANT
STEM HUMERAL SZ8 STANDARD (Stem) ×3 IMPLANT
STEM HUMERAL SZ8 STD (Stem) IMPLANT
STRAP SAFETY 5IN WIDE (MISCELLANEOUS) ×3 IMPLANT
STRIP CLOSURE SKIN 1/2X4 (GAUZE/BANDAGES/DRESSINGS) IMPLANT
SUT VIC AB 0 CT2 27 (SUTURE) ×3 IMPLANT
SUT VIC AB 2-0 CT1 18 (SUTURE) ×6 IMPLANT
SUT VIC AB PLUS 45CM 1-MO-4 (SUTURE) IMPLANT
SYR 10ML LL (SYRINGE) ×3 IMPLANT
TAPE MICROFOAM 4IN (TAPE) ×3 IMPLANT
TAPE SUT 30 1/2 CRC GREEN (SUTURE) ×6 IMPLANT
TIP FAN IRRIG PULSAVAC PLUS (DISPOSABLE) ×1 IMPLANT
WATER STERILE IRR 1000ML POUR (IV SOLUTION) ×6 IMPLANT

## 2019-12-21 NOTE — Transfer of Care (Signed)
Immediate Anesthesia Transfer of Care Note  Patient: [name]  Procedure(s) Performed: REVERSE SHOULDER ARTHROPLASTY (Right Shoulder)  Patient Location: PACU  Anesthesia Type:General  Level of Consciousness: awake, alert  and patient cooperative  Airway & Oxygen Therapy: Patient Spontanous Breathing and Patient connected to face mask oxygen  Post-op Assessment: Report given to RN and Post -op Vital signs reviewed and stable  Post vital signs: Reviewed and stable  Last Vitals:  Vitals Value Taken Time  BP 139/73 12/21/19 1057  Temp 36.3 C 12/21/19 1057  Pulse 60 12/21/19 1103  Resp 18 12/21/19 1103  SpO2 100 % 12/21/19 1103  Vitals shown include unvalidated device data.  Last Pain:  Vitals:   12/21/19 1102  TempSrc:   PainSc: 10-Worst pain ever      Patients Stated Pain Goal: 0 (38/25/05 3976)  Complications: No complications documented.

## 2019-12-21 NOTE — Progress Notes (Signed)
Ch responded to CB. Code cancelled soon after arrival.

## 2019-12-21 NOTE — Progress Notes (Signed)
Cross Cover Brief Note Patient with seizure activity.  Duration approx 10 min.  Hypoglycemia ruled out.  Abated with 2 mg lorazepam IV in less than 1 minute.  Was able to communicate.  Patient was able to communicate that she did take her vimpat last night and this morning.  Last seizure activity prior to today was in January.  Dr. Harlow Mares made aware of seizure activity .

## 2019-12-21 NOTE — Anesthesia Preprocedure Evaluation (Addendum)
Anesthesia Evaluation  Patient identified by MRN, date of birth, ID band Patient awake    Reviewed: Allergy & Precautions, NPO status , Patient's Chart, lab work & pertinent test results  History of Anesthesia Complications Negative for: history of anesthetic complications  Airway Mallampati: II  TM Distance: >3 FB Neck ROM: Full    Dental  (+) Upper Dentures, Lower Dentures, Dental Advisory Given   Pulmonary shortness of breath, asthma , sleep apnea and Oxygen sleep apnea , pneumonia, resolved, COPD,  COPD inhaler and oxygen dependent, Current SmokerPatient did not abstain from smoking.,    + rhonchi  (-) wheezing      Cardiovascular hypertension, Pt. on medications +CHF (preserved EF)  (-) CAD, (-) Past MI, (-) Cardiac Stents and (-) CABG + Valvular Problems/Murmurs  Rhythm:Regular Rate:Normal - Systolic murmurs and - Diastolic murmurs Echo 01/08/87: NORMAL LEFT VENTRICULAR SYSTOLIC FUNCTION WITH AN ESTIMATED EF = 50-55 % NORMAL RIGHT VENTRICULAR SYSTOLIC FUNCTION MILD-TO-MODERATE TRICUSPID AND MITRAL VALVE INSUFFICIENCY TRACE AORTIC VALVE INSUFFICIENCY MILD AORTIC VALVE STENOSIS WITH A CALCULATED AVA = 1.6 cm^2 BY CONTINUITY EQUATION MILD LA ENLARGEMENT MILD LVH   Neuro/Psych Seizures -, Well Controlled,  PSYCHIATRIC DISORDERS Depression CVA, Residual Symptoms    GI/Hepatic GERD  ,(+) Hepatitis -  Endo/Other  diabetes, Oral Hypoglycemic Agents  Renal/GU negative Renal ROS     Musculoskeletal negative musculoskeletal ROS (+)   Abdominal (+) - obese,   Peds  Hematology negative hematology ROS (+)   Anesthesia Other Findings Past Medical History: No date: Asthma No date: CHF (congestive heart failure) (HCC) No date: Cirrhosis, non-alcoholic (HCC) No date: COPD (chronic obstructive pulmonary disease) (HCC) No date: Deaf No date: Depression No date: Diabetes mellitus without complication (HCC) No date: GERD  (gastroesophageal reflux disease) No date: Heart murmur No date: Hepatitis No date: Hypertension No date: Lymph node disorder     Comment:  arm No date: Neuropathy No date: On home oxygen therapy     Comment:  hs No date: Orthopnea No date: RLS (restless legs syndrome) No date: Seizures (HCC) No date: Shortness of breath dyspnea No date: Sleep apnea No date: Stroke [facility])     Comment:  tia   Reproductive/Obstetrics                             Anesthesia Physical  Anesthesia Plan  ASA: III  Anesthesia Plan: General   Post-op Pain Management:    Induction: Intravenous  PONV Risk Score and Plan: 3 and Ondansetron and Dexamethasone  Airway Management Planned: Oral ETT  Additional Equipment: None  Intra-op Plan:   Post-operative Plan: Extubation in OR and Possible Post-op intubation/ventilation  Informed Consent: I have reviewed the patients History and Physical, chart, labs and discussed the procedure including the risks, benefits and alternatives for the proposed anesthesia with the patient or authorized representative who has indicated his/her understanding and acceptance.     Dental advisory given and Interpreter used for interveiw  Plan Discussed with: CRNA and Surgeon  Anesthesia Plan Comments: (In-person ASL interpreter used for consent.  Patient has an occasional cough, says her breathing status today is her baseline. Took her usual breathing treatments today.  Discussed risks of anesthesia with patient, including PONV, sore throat, lip/dental damage. Rare risks discussed as well, such as cardiorespiratory and neurological sequelae. Patient counseled on being higher risk for anesthesia due to comorbidities: COPD requiring home O2. Patient was told about increased risk of cardiac and  respiratory events, including possible prolonged intubation , and death. Patient understands. Discussed with patient about her being an inappropriate  candidate for nerve block for post operative analgesia due to excessive risk of phrenic nerve blockade and compromise of her already poor respiratory function. Patient understands. )       Anesthesia Quick Evaluation

## 2019-12-21 NOTE — H&P (Signed)
The patient has been re-examined, and the chart reviewed, and there have been no interval changes to the documented history and physical.  Plan a right shoulder arthroplasty today.  Anesthesia is consulted regarding a peripheral nerve block for post-operative pain.  The risks, benefits, and alternatives have been discussed at length, and the patient is willing to proceed.

## 2019-12-21 NOTE — Anesthesia Procedure Notes (Signed)
Procedure Name: Intubation Performed by: Kelton Pillar, CRNA Pre-anesthesia Checklist: Patient identified, Emergency Drugs available, Suction available and Patient being monitored Patient Re-evaluated:Patient Re-evaluated prior to induction Oxygen Delivery Method: Circle system utilized Preoxygenation: Pre-oxygenation with 100% oxygen Induction Type: IV induction Ventilation: Mask ventilation without difficulty Laryngoscope Size: McGraph and 3 Grade View: Grade I Tube type: Oral Tube size: 7.0 mm Number of attempts: 1 Airway Equipment and Method: Stylet and Oral airway Placement Confirmation: ETT inserted through vocal cords under direct vision,  positive ETCO2 and breath sounds checked- equal and bilateral Secured at: 21 cm Tube secured with: Tape Dental Injury: Teeth and Oropharynx as per pre-operative assessment

## 2019-12-21 NOTE — Anesthesia Postprocedure Evaluation (Signed)
Anesthesia Post Note  Patient: [name]  Procedure(s) Performed: REVERSE SHOULDER ARTHROPLASTY (Right Shoulder)  Patient location during evaluation: PACU Anesthesia Type: General Level of consciousness: awake and alert Pain management: pain level controlled Vital Signs Assessment: post-procedure vital signs reviewed and stable Respiratory status: spontaneous breathing, nonlabored ventilation, respiratory function stable and patient connected to nasal cannula oxygen Cardiovascular status: blood pressure returned to baseline and stable Postop Assessment: no apparent nausea or vomiting Anesthetic complications: no   No complications documented.   Last Vitals:  Vitals:   12/21/19 1226 12/21/19 1254  BP: (!) 113/50   Pulse: (!) 57 (!) 57  Resp: 16   Temp: (!) 36.3 C   SpO2: 94% 95%    Last Pain:  Vitals:   12/21/19 1254  TempSrc:   PainSc: 0-No pain                 Arita Miss

## 2019-12-21 NOTE — Plan of Care (Signed)
  Problem: Education: Goal: Knowledge of General Education information will improve Description: Including pain rating scale, medication(s)/side effects and non-pharmacologic comfort measures Outcome: Progressing Note: Pt deaf  whispers  some words. Use of sign lang. Interpreter on a stick   Problem: Safety: Goal: Ability to remain free from injury will improve Outcome: Progressing

## 2019-12-21 NOTE — Evaluation (Signed)
Physical Therapy Evaluation Patient Details Name: [name] MRN: [id number] DOB: 1951-01-18 Today's Date: 12/21/2019   History of Present Illness  Patient is s/p reverse total shoulder. PMH includes: HTN, HLD, DM, COPD, uses O2 at night, CHF  Clinical Impression  Patient received in bed sleeping. Rouses to touch. Sign language interpreter utilized. Patient groggy and feeling a little "crazy". She demonstrates difficulty processing and following direction. She performed supine to sit with min guard, sit to supine with min assist. She was able to stand with min assist and ambulated 4 feet with single Hand held assist and then pivoted to [facility] and back to bed. She will continue to benefit from skilled PT while here to improve safety with mobility for functional independence.       Follow Up Recommendations Home health PT;Supervision/Assistance - 24 hour    Equipment Recommendations  None recommended by PT    Recommendations for Other Services       Precautions / Restrictions Precautions Precautions: Fall;Shoulder Shoulder Interventions: Shoulder sling/immobilizer Restrictions Weight Bearing Restrictions: Yes RUE Weight Bearing: Non weight bearing      Mobility  Bed Mobility Overal bed mobility: Modified Independent;Needs Assistance Bed Mobility: Sit to Supine;Supine to Sit     Supine to sit: Min guard Sit to supine: Min assist   General bed mobility comments: patient required min assist to bring legs back onto bed.  Transfers Overall transfer level: Needs assistance Equipment used: 1 person hand held assist Transfers: Sit to/from [facility] Sit to Stand: Min assist Stand pivot transfers: Min assist       General transfer comment: patient requires min assist for steadying when on feet.  Ambulation/Gait Ambulation/Gait assistance: Min assist Gait Distance (Feet): 4 Feet Assistive device: 1 person hand held assist Gait Pattern/deviations: Step-to  pattern;Staggering left;Staggering right;Decreased stride length;Shuffle Gait velocity: decreased   General Gait Details: patient unsteady with ambulation due to grogginess. Requires assistance for safety at this time.  Stairs            Wheelchair Mobility    Modified Rankin (Stroke Patients Only)       Balance Overall balance assessment: Needs assistance Sitting-balance support: Feet supported Sitting balance-Leahy Scale: Good     Standing balance support: During functional activity;Single extremity supported Standing balance-Leahy Scale: Fair Standing balance comment: unsteady. Requires assistance for dynamic standing activities.                             Pertinent Vitals/Pain Pain Assessment: 0-10 Pain Score: 8  Pain Descriptors / Indicators: Discomfort;Operative site guarding;Sore Pain Intervention(s): Monitored during session;Limited activity within patient's tolerance    Home Living Family/patient expects to be discharged to:: Private residence Living Arrangements: Children Available Help at Discharge: Family;Available PRN/intermittently Type of Home: Apartment Home Access: Level entry     Home Layout: One level Home Equipment: Grab bars - tub/shower;Grab bars - toilet;Walker - 4 wheels;Wheelchair - manual;Cane - single point;Shower seat Additional Comments: Patient was not using walker or cane prior to admission    Prior Function Level of Independence: Independent         Comments: Pt reports being independent in self care ADLs; son would do cooking/cleaning;     Hand Dominance   Dominant Hand: Right    Extremity/Trunk Assessment   Upper Extremity Assessment Upper Extremity Assessment: Defer to OT evaluation    Lower Extremity Assessment Lower Extremity Assessment: Generalized weakness    Cervical /  Trunk Assessment Cervical / Trunk Assessment: Normal  Communication   Communication: Deaf;Interpreter utilized  Cognition  Arousal/Alertness: Suspect due to medications;Lethargic Behavior During Therapy: WFL for tasks assessed/performed Overall Cognitive Status: Impaired/Different from baseline Area of Impairment: Problem solving;Following commands;Attention                   Current Attention Level: Selective   Following Commands: Follows one step commands inconsistently;Follows one step commands with increased time     Problem Solving: Slow processing;Difficulty sequencing;Requires verbal cues;Requires tactile cues General Comments: patient required multimodal cues for mobility, turning around      General Comments      Exercises     Assessment/Plan    PT Assessment Patient needs continued PT services  PT Problem List Decreased mobility;Decreased activity tolerance;Decreased balance;Pain;Decreased strength       PT Treatment Interventions DME instruction;Therapeutic activities;Gait training;Patient/family education;Stair training;Balance training;Functional mobility training;Therapeutic exercise    PT Goals (Current goals can be found in the Care Plan section)  Acute Rehab PT Goals Patient Stated Goal: none stated PT Goal Formulation: Patient unable to participate in goal setting Time For Goal Achievement: 12/28/19    Frequency BID   Barriers to discharge        Co-evaluation               AM-PAC PT "6 Clicks" Mobility  Outcome Measure Help needed turning from your back to your side while in a flat bed without using bedrails?: A Little Help needed moving from lying on your back to sitting on the side of a flat bed without using bedrails?: A Little Help needed moving to and from a bed to a chair (including a wheelchair)?: A Little Help needed standing up from a chair using your arms (e.g., wheelchair or bedside chair)?: A Little Help needed to walk in hospital room?: A Lot Help needed climbing 3-5 steps with a railing? : A Lot 6 Click Score: 16    End of Session  Equipment Utilized During Treatment: Gait belt Activity Tolerance: Patient limited by pain;Patient limited by lethargy Patient left: in bed;with call bell/phone within reach;with bed alarm set Nurse Communication: Mobility status PT Visit Diagnosis: Other abnormalities of gait and mobility (R26.89);Unsteadiness on feet (R26.81);History of falling (Z91.81);Muscle weakness (generalized) (M62.81);Pain;Difficulty in walking, not elsewhere classified (R26.2) Pain - Right/Left: Right Pain - part of body: Shoulder    Time: 0349-6116 PT Time Calculation (min) (ACUTE ONLY): 23 min   Charges:   PT Evaluation $PT Eval Moderate Complexity: 1 Mod PT Treatments $Therapeutic Activity: 8-22 mins        [employer], PT, GCS 12/21/19,4:03 PM

## 2019-12-21 NOTE — Progress Notes (Signed)
Pt from surgery  Rt shoulder in sling with dressing/ pt sleepy from pain med given in pacu. Will open eyes  But drifts back to sleep.no distress  ivfs infusing. 02 2l Girard. vss.

## 2019-12-21 NOTE — Op Note (Addendum)
12/21/2019  10:44 AM  Patient:   [name]  Pre-Op Diagnosis:  Proximal humerus fracture malunion with AVN  Post-Op Diagnosis:   Same  Procedure:   Reverse right total shoulder arthroplasty.  Surgeon:   Kurtis Bushman, MD  Assistant:   Carlynn Spry, PA-C  Anesthesia:   General endotracheal with an interscalene block placed preoperatively by the anesthesiologist.  Findings:   As above.  Complications:   None  EBL:  150 cc  Drains:   None  Closure:   Staples  Implants:   All press-fit J&J Delta Xtend Metaglene, Glenosphere 38 mm standard, Humeral PE Cup standard +3, Fracture Epiphysis, Porocoat standard stem 8 mm  Brief Clinical Note:   The patient has failed multiple conservative treatments for painful proximal humerus fracture nonunion with AVN. The patient presents at this time for a reverse right total shoulder arthroplasty.  Procedure:   The patient underwent placement of an interscalene block by the anesthesiologist in the preoperative holding area before being brought into the operating room and lain in the supine position. The patient then underwent general endotracheal intubation and anesthesia before a Foley catheter was inserted and the patient repositioned in the beach chair position using the beach chair positioner. The right shoulder and upper extremity were prepped with ChloraPrep solution before being draped sterilely. Preoperative antibiotics were administered. A standard anterior approach to the shoulder was made through an approximately 4-5 inch incision. The incision was carried down through the subcutaneous tissues to expose the deltopectoral fascia. The interval between the deltoid and pectoralis muscles was identified and this plane developed, retracting the cephalic vein laterally with the deltoid muscle. The conjoined tendon was identified. Its lateral margin was dissected and the Kolbel self-retraining retractor inserted. The "three sisters" were identified  and cauterized. Bursal tissues were removed to improve visualization. The subscapularis tendon was released from its attachment to the lesser tuberosity 1 cm proximal to its insertion and several tagging sutures placed. The inferior capsule was released with care after identifying and protecting the axillary nerve. The proximal humeral cut was made at approximately 30 of retroversion using the extra-medullary guide.   Attention was redirected to the glenoid. The labrum was debrided circumferentially before the center of the glenoid was marked with electrocautery. The guidewire was drilled into the glenoid neck using the appropriate guide. After verifying its position, it was overreamed with the mini-baseplate reamer to create a flat surface. The permanent mini-baseplate was impacted into place. It was stabilized with four peripheral screws. Locking screws were placed superiorly and inferiorly while nonlocking screws were placed anteriorly and posteriorly. The permanent 36 mm glenosphere was then impacted into place and its Morse taper locking mechanism verified using manual distraction.  Attention was directed to the humeral side. The humeral canal was reamed sequentially beginning with the end-cutting reamer then progressing from a 6 mm reamer up to a 8 mm reamer. This provided excellent circumferential chatter. The canal was broached. This was left in place and a trial reduction performed using the standard trial humeral platform. The arm demonstrated excellent range of motion as the hand could be brought across the chest to the opposite shoulder and brought to the top of the patient's head and to the patient's ear. The shoulder appeared stable throughout this range of motion. The joint was dislocated and the trial components removed. The permanent stem was impacted into place with care taken to maintain the appropriate version. The permanent 38/+3 mm humeral platform with the standard  insert was put together  on the back table and impacted into place. Again, the [facility] taper locking mechanism was verified using manual distraction. The shoulder was relocated using two finger pressure and again placed through a range of motion with the findings as described above.  The wound was copiously irrigated with bacitracin saline solution using the jet lavage system. The subscapularis tendon was reapproximated using #2 FiberWire interrupted sutures. The deltopectoral interval was closed using #0 Vicryl interrupted sutures before the subcutaneous tissues were closed using 2-0 Vicryl interrupted sutures. The skin was closed using staples. Prior to closing the skin, 1 g of transexemic acid in 10 cc of normal saline was injected intra-articularly to help with postoperative bleeding. A sterile occlusive dressing was applied to the wound before the arm was placed into a shoulder immobilizer with an abduction pillow. A Polar Care system also was applied to the shoulder. The patient was then transferred back to a hospital bed before being awakened, extubated, and returned to the recovery room in satisfactory condition after tolerating the procedure well.

## 2019-12-22 ENCOUNTER — Encounter: Payer: Self-pay | Admitting: Orthopedic Surgery

## 2019-12-22 DIAGNOSIS — K219 Gastro-esophageal reflux disease without esophagitis: Secondary | ICD-10-CM

## 2019-12-22 DIAGNOSIS — I10 Essential (primary) hypertension: Secondary | ICD-10-CM

## 2019-12-22 DIAGNOSIS — J449 Chronic obstructive pulmonary disease, unspecified: Secondary | ICD-10-CM

## 2019-12-22 LAB — MAGNESIUM: Magnesium: 1.4 mg/dL — ABNORMAL LOW (ref 1.7–2.4)

## 2019-12-22 MED ORDER — MAGNESIUM SULFATE 2 GM/50ML IV SOLN
2.0000 g | Freq: Once | INTRAVENOUS | Status: AC
  Administered 2019-12-22: 2 g via INTRAVENOUS
  Filled 2019-12-22: qty 50

## 2019-12-22 MED ORDER — CHLORHEXIDINE GLUCONATE CLOTH 2 % EX PADS
6.0000 | MEDICATED_PAD | Freq: Every day | CUTANEOUS | Status: DC
  Administered 2019-12-24: 6 via TOPICAL

## 2019-12-22 MED ORDER — MUPIROCIN 2 % EX OINT
1.0000 "application " | TOPICAL_OINTMENT | Freq: Two times a day (BID) | CUTANEOUS | Status: DC
  Administered 2019-12-22 – 2019-12-23 (×4): 1 via NASAL
  Filled 2019-12-22 (×2): qty 22

## 2019-12-22 MED ORDER — HYDROCODONE-ACETAMINOPHEN 5-325 MG PO TABS: 1.0000 | ORAL_TABLET | ORAL | 0 refills | Status: DC | PRN

## 2019-12-22 NOTE — Progress Notes (Signed)
  Subjective:  Patient reports pain as mild.    Objective:   VITALS:   Vitals:   12/21/19 2116 12/21/19 2122 12/22/19 0006 12/22/19 0343  BP: (!) 131/94 (!) 131/59 (!) 144/82 139/74  Pulse: (!) 58 (!) 56 64 68  Resp:  _0 Temp:   98.2 F (36.8 C) 98 F (36.7 C)  TempSrc:    Oral  SpO2: 97% 98% 90% 95%  Weight:      Height:        PHYSICAL EXAM:  Neurologically intact ABD soft Neurovascular intact Sensation intact distally Intact pulses distally Dorsiflexion/Plantar flexion intact Incision: scant drainage No cellulitis present Compartment soft dressing changed  LABS  Results for orders placed or performed during the hospital encounter of 12/21/19 (from the past 24 hour(s))  I-STAT, chem 8     Status: Abnormal   Collection Time: 12/21/19  7:38 AM  Result Value Ref Range   Sodium 140 135 - 145 mmol/L   Potassium 3.3 (L) 3.5 - 5.1 mmol/L   Chloride 105 98 - 111 mmol/L   BUN 29 (H) 8 - 23 mg/dL   Creatinine, Ser 2.20 (H) 0.44 - 1.00 mg/dL   Glucose, Bld 143 (H) 70 - 99 mg/dL   Calcium, Ion 0.86 (LL) 1.15 - 1.40 mmol/L   TCO2 27 22 - 32 mmol/L   Hemoglobin 12.2 12.0 - 15.0 g/dL   HCT 36.0 36 - 46 %  Surgical pcr screen     Status: Abnormal   Collection Time: 12/21/19  7:54 AM   Specimen: Nasal Mucosa; Nasal Swab  Result Value Ref Range   MRSA, PCR NEGATIVE NEGATIVE   Staphylococcus aureus POSITIVE (A) NEGATIVE  Glucose, capillary     Status: Abnormal   Collection Time: 12/21/19 11:04 AM  Result Value Ref Range   Glucose-Capillary 210 (H) 70 - 99 mg/dL  Glucose, capillary     Status: Abnormal   Collection Time: 12/21/19  9:12 PM  Result Value Ref Range   Glucose-Capillary 313 (H) 70 - 99 mg/dL    DG Shoulder Right Port  Result Date: 12/21/2019 CLINICAL DATA:  Status post right shoulder reverse arthroplasty. EXAM: PORTABLE RIGHT SHOULDER COMPARISON:  CT shoulder 11/22/2019 FINDINGS: Postoperative changes from right total shoulder arthroplasty  identified. The hardware components are in anatomic alignment. Gas is identified within the joint space. No periprosthetic fracture or subluxation. IMPRESSION: Status post right total shoulder arthroplasty. Electronically Signed   By: Kerby Moors M.D.   On: 12/21/2019 11:42   [place] OR NERVE BLOCK-IMAGE ONLY [facility])  Result Date: 12/21/2019 There is no interpretation for this exam.  This order is for images obtained during a surgical procedure.  Please See "Surgeries" Tab for more information regarding the procedure.    Assessment/Plan: 1 Day Post-Op   Active Problems:   S/P reverse total shoulder arthroplasty, right   Advance diet Up with therapy  Discharge today after PT   Carlynn Spry , PA-C 12/22/2019, 7:05 AM

## 2019-12-22 NOTE — Discharge Summary (Signed)
Physician Discharge Summary  Patient ID: [name] MRN: [id number] DOB/AGE: 68-23-1952 68 y.o.  Admit date: 12/21/2019 Discharge date: 12/22/2019  Admission Diagnoses:  M19.011 Primary osteoarthritis, right shoulder <principal problem not specified>  Discharge Diagnoses:  M19.011 Primary osteoarthritis, right shoulder Active Problems:   S/P reverse total shoulder arthroplasty, right   Past Medical History:  Diagnosis Date  . Asthma   . CHF (congestive heart failure) ([place])   . Cirrhosis, non-alcoholic ([place])   . COPD (chronic obstructive pulmonary disease) ([place])   . Deaf   . Depression   . GERD (gastroesophageal reflux disease)   . Heart murmur   . Hepatitis   . Hypertension   . Lymph node disorder    arm  . Neuropathy   . On home oxygen therapy    hs  . Orthopnea   . RLS (restless legs syndrome)   . Seizures ([place])   . Shortness of breath dyspnea   . Sleep apnea   . Stroke [facility])    tia    Surgeries: Procedure(s): REVERSE SHOULDER ARTHROPLASTY on 12/21/2019   Consultants (if any):   Discharged Condition: Improved  Hospital Course: [name] is an 69 y.o. female who was admitted 12/21/2019 with a diagnosis of  M19.011 Primary osteoarthritis, right shoulder <principal problem not specified> and went to the operating room on 12/21/2019 and underwent the above named procedures.    She was given perioperative antibiotics:  Anti-infectives (From admission, onward)   Start     Dose/Rate Route Frequency Ordered Stop   12/21/19 1500  clindamycin (CLEOCIN) IVPB 600 mg        600 mg 100 mL/hr over 30 Minutes Intravenous Every 6 hours 12/21/19 1228 12/22/19 0329   12/21/19 0912  50,000 units bacitracin in 0.9% normal saline 250 mL irrigation  Status:  Discontinued          As needed 12/21/19 0912 12/21/19 1211   12/21/19 0649  clindamycin (CLEOCIN) 900 MG/50ML IVPB       Note to Pharmacy: Register, Karen   : cabinet override      12/21/19 0649 12/21/19 0905    12/21/19 0600  clindamycin (CLEOCIN) IVPB 900 mg        900 mg 100 mL/hr over 30 Minutes Intravenous On call to O.R. 12/21/19 0156 12/21/19 0900    .  She was given sequential compression devices, early ambulation, and Plavix for DVT prophylaxis.  She benefited maximally from the hospital stay and there were no complications.    Recent vital signs:  Vitals:   12/22/19 0006 12/22/19 0343  BP: (!) 144/82 139/74  Pulse: 64 68  Resp: 17 20  Temp: 98.2 F (36.8 C) 98 F (36.7 C)  SpO2: 90% 95%    Recent laboratory studies:  Lab Results  Component Value Date   HGB 12.2 12/21/2019   HGB 14.8 12/13/2019   HGB 12.6 08/13/2019   Lab Results  Component Value Date   WBC 7.5 12/13/2019   PLT 266 12/13/2019   Lab Results  Component Value Date   INR 1.0 12/13/2019   Lab Results  Component Value Date   NA 140 12/21/2019   K 3.3 (L) 12/21/2019   CL 105 12/21/2019   CO2 30 12/13/2019   BUN 29 (H) 12/21/2019   CREATININE 2.20 (H) 12/21/2019   GLUCOSE 143 (H) 12/21/2019    Discharge Medications:   Allergies as of 12/22/2019      Reactions   Aspirin Itching  Celebrex [celecoxib] Itching   itching   Ciprofloxacin Itching   Codeine Itching   Fosphenytoin Itching   Levaquin [levofloxacin In D5w] Itching   Levofloxacin Itching   Lovastatin Itching   Penicillins    Documentation indicates severe reaction Pt tolerated cephalosporin without adverse reaction 09/18   Pravastatin Itching   Sulfa Antibiotics Itching      Medication List    STOP taking these medications   oxyCODONE 5 MG immediate release tablet Commonly known as: Oxy IR/ROXICODONE     TAKE these medications   acetaminophen 325 MG tablet Commonly known as: TYLENOL Take 2 tablets (650 mg total) by mouth every 6 (six) hours as needed for mild pain (or Fever >/= 101).   acidophilus Caps capsule Take 1 capsule by mouth daily.   albuterol 108 (90 Base) MCG/ACT inhaler Commonly known as: VENTOLIN  HFA Inhale 2 puffs into the lungs every 4 (four) hours as needed for wheezing or shortness of breath.   albuterol (2.5 MG/3ML) 0.083% nebulizer solution Commonly known as: PROVENTIL Take 3 mLs (2.5 mg total) by nebulization every 4 (four) hours as needed for wheezing or shortness of breath.   citalopram 10 MG tablet Commonly known as: CELEXA Take 10 mg by mouth daily.   clopidogrel 75 MG tablet Commonly known as: PLAVIX Take 1 tablet (75 mg total) by mouth daily.   docusate sodium 100 MG capsule Commonly known as: COLACE Take 1 capsule (100 mg total) by mouth 2 (two) times daily as needed for mild constipation.   furosemide 40 MG tablet Commonly known as: LASIX Take 40 mg by mouth daily.   HYDROcodone-acetaminophen 5-325 MG tablet Commonly known as: NORCO/VICODIN Take 1 tablet by mouth every 4 (four) hours as needed for moderate pain (pain score 4-6).   lacosamide 50 MG Tabs tablet Commonly known as: VIMPAT Take 1 tablet (50 mg total) by mouth 2 (two) times daily.   losartan 50 MG tablet Commonly known as: COZAAR Take 50 mg by mouth daily.   meclizine 25 MG tablet Commonly known as: ANTIVERT Take 1 tablet (25 mg total) by mouth 3 (three) times daily as needed for dizziness.   methocarbamol 500 MG tablet Commonly known as: ROBAXIN Take 1 tablet (500 mg total) by mouth every 8 (eight) hours as needed for muscle spasms.   metoprolol succinate 25 MG 24 hr tablet Commonly known as: TOPROL-XL Take 1 tablet (25 mg total) by mouth daily.   montelukast 10 MG tablet Commonly known as: SINGULAIR Take 10 mg by mouth at bedtime.   omeprazole 20 MG capsule Commonly known as: PRILOSEC Take 20 mg by mouth daily.   ondansetron 8 MG disintegrating tablet Commonly known as: Zofran ODT Take 1 tablet (8 mg total) by mouth every 8 (eight) hours as needed for nausea or vomiting.   OXYGEN Inhale 2 L into the lungs at bedtime.   polyethylene glycol 17 g packet Commonly known as:  MIRALAX / GLYCOLAX Take 17 g by mouth daily as needed.   potassium chloride 10 MEQ tablet Commonly known as: KLOR-CON Take 2 tablets (20 mEq total) by mouth daily.   rosuvastatin 10 MG tablet Commonly known as: CRESTOR Take 10 mg by mouth daily.       Diagnostic Studies: CT SHOULDER RIGHT WO CONTRAST  Result Date: 11/22/2019 CLINICAL DATA:  Status post fall, right shoulder pain, pre-surgical planning EXAM: CT OF THE UPPER RIGHT EXTREMITY WITHOUT CONTRAST TECHNIQUE: Multidetector CT imaging of the upper right extremity was performed according  to the standard protocol. COMPARISON:  None. FINDINGS: Bones/Joint/Cartilage Healed comminuted malunited fracture of the surgical neck of the proximal humerus and extension into the greater and lesser tuberosities. Inferior pseudosubluxation of the humeral head relative to the glenoid secondary to large joint effusion. Subchondral sclerosis in the superior humeral head concerning for avascular necrosis. No acute fracture or dislocation. No aggressive osseous lesion. Moderate arthropathy of the acromioclavicular joint. Incidental note made of an os acromiale. Ligaments Ligaments are suboptimally evaluated by CT. Muscles and Tendons Muscles are normal. No intramuscular fluid collection or hematoma. Soft tissue No fluid collection or hematoma. No soft tissue mass. IMPRESSION: 1. Healed comminuted malunited fracture of the surgical neck of the proximal humerus and extension into the greater and lesser tuberosities. 2. Inferior pseudosubluxation of the humeral head relative to the glenoid secondary to large joint effusion. 3. Subchondral sclerosis in the superior humeral head concerning for avascular necrosis. Electronically Signed   By: Kathreen Devoid   On: 11/22/2019 11:24   DG Shoulder Right Port  Result Date: 12/21/2019 CLINICAL DATA:  Status post right shoulder reverse arthroplasty. EXAM: PORTABLE RIGHT SHOULDER COMPARISON:  CT shoulder 11/22/2019 FINDINGS:  Postoperative changes from right total shoulder arthroplasty identified. The hardware components are in anatomic alignment. Gas is identified within the joint space. No periprosthetic fracture or subluxation. IMPRESSION: Status post right total shoulder arthroplasty. Electronically Signed   By: Kerby Moors M.D.   On: 12/21/2019 11:42   [place] OR NERVE BLOCK-IMAGE ONLY [facility])  Result Date: 12/21/2019 There is no interpretation for this exam.  This order is for images obtained during a surgical procedure.  Please See "Surgeries" Tab for more information regarding the procedure.    Disposition: Discharge disposition: 01-Home or Self Care      Follow up in 2 weeks for staple removal Call [phone] to confirm appointment      Signed: Carlynn Spry ,PA-C 12/22/2019, 7:10 AM

## 2019-12-22 NOTE — TOC Progression Note (Signed)
Transition of Care [facility]) - Progression Note    Patient Details  Name: [name] MRN: [id number] Date of Birth: 1951/02/04  Transition of [facility]) CM/SW Contact  Selyna Klahn, Gardiner Rhyme, LCSW Phone Number: 12/22/2019, 2:31 PM  Clinical Narrative:   Son has many concerns regarding taking pt home today with her having seizures. Bedside RN has contacted MD and this worker has also secured messaged MD to call son. He is concerned regarding pt's care and their ability to provide this. Have yet to see PT and OT due to seizures today. Will continue to work on discharge needs and best plan for pt.    Expected Discharge Plan: [place] Barriers to Discharge: No Barriers Identified  Expected Discharge Plan and Services Expected Discharge Plan: [place] In-house Referral: Clinical Social Work   Post Acute Care Choice: [place] arrangements for the past 2 months: Apartment Expected Discharge Date: 12/22/19                         HH Arranged: PT [place]: [place] (now [facility]) Date [place]: 12/22/19 Time [place]: [phone] Representative spoke with at [place]: teresa   Social Determinants of Health ([place]) Interventions    Readmission Risk Interventions No flowsheet data found.

## 2019-12-22 NOTE — Consult Note (Signed)
[place] [facility] [place] NAME: [name]    MR#:  [id number]  DATE OF BIRTH:  12/02/1950  DATE OF ADMISSION:  12/21/2019  PRIMARY CARE PHYSICIAN: [facility] [place]   REQUESTING/REFERRING PHYSICIAN: Dr Harlow Mares  CHIEF COMPLAINT:  seizure since last night  HISTORY OF PRESENT ILLNESS:  [name]  is a 69 y.o. female with a known history of known chronic seizure disorder, deafness, Jerrye Bushy, depression, hypertension, non-alcoholic cirrhosis, COPD admitted on the orthopedic floor for reversal of right shoulder arthroplasty which was performed on 616 2021  Patient is on Vimpat for her known history of seizures. She had three seizures in 24 hours according to her nurse. Last seizure was around 4 PM.  During my evaluation patient is hemodynamically stable. She is sitting up in the bed eating ice cream.  No family in the room  Internal medicine was consulted. Patient follows with Dr. Manuella Ghazi as outpatient. A recent EEG was performed in March 2021 that showed brief sleep abnormal EEG due to transient left temporal t-test slowing of the background without epileptiform discharges.  PAST MEDICAL HISTORY:   Past Medical History:  Diagnosis Date  . Asthma   . CHF (congestive heart failure) ([place])   . Cirrhosis, non-alcoholic ([place])   . COPD (chronic obstructive pulmonary disease) ([place])   . Deaf   . Depression   . GERD (gastroesophageal reflux disease)   . Heart murmur   . Hepatitis   . Hypertension   . Lymph node disorder    arm  . Neuropathy   . On home oxygen therapy    hs  . Orthopnea   . RLS (restless legs syndrome)   . Seizures ([place])   . Shortness of breath dyspnea   . Sleep apnea   . Stroke ([place])    tia    PAST SURGICAL HISTOIRY:   Past Surgical History:  Procedure Laterality Date  . CATARACT EXTRACTION W/PHACO Right 11/23/2014   Procedure: CATARACT EXTRACTION PHACO AND INTRAOCULAR LENS PLACEMENT (IOC);  Surgeon:  Lyla Glassing, MD;  Location: [facility] ORS;  Service: Ophthalmology;  Laterality: Right;  . CATARACT EXTRACTION W/PHACO Left 12/14/2014   Procedure: CATARACT EXTRACTION PHACO AND INTRAOCULAR LENS PLACEMENT (IOC);  Surgeon: Lyla Glassing, MD;  Location: [facility] ORS;  Service: Ophthalmology;  Laterality: Left;  US:01:16.6 AP:15.8 CDE:12.14  . CESAREAN SECTION    . CHOLECYSTECTOMY    . REVERSE SHOULDER ARTHROPLASTY Right 12/21/2019   Procedure: REVERSE SHOULDER ARTHROPLASTY;  Surgeon: Lovell Sheehan, MD;  Location: [facility] ORS;  Service: Orthopedics;  Laterality: Right;  . THUMB ARTHROSCOPY    . TONSILLECTOMY    . TYMPANOPLASTY     muliple    SOCIAL HISTORY:   Social History   Tobacco Use  . Smoking status: Current Every Day Smoker    Packs/day: 0.50    Years: 50.00    Pack years: 25.00    Types: Cigarettes    Start date: 03/01/1968  . Smokeless tobacco: Never Used  Substance Use Topics  . Alcohol use: No    FAMILY HISTORY:   Family History  Problem Relation Age of Onset  . Lung cancer Mother   . CAD Father     DRUG ALLERGIES:   Allergies  Allergen Reactions  . Aspirin Itching  . Celebrex [Celecoxib] Itching    itching  . Ciprofloxacin Itching  . Codeine Itching  . Fosphenytoin Itching  . Levaquin [Levofloxacin In D5w] Itching  . Levofloxacin Itching  .  Lovastatin Itching  . Penicillins     Documentation indicates severe reaction  Pt tolerated cephalosporin without adverse reaction 09/18   . Pravastatin Itching  . Sulfa Antibiotics Itching    REVIEW OF SYSTEMS:  limited due to patient understanding Review of Systems  Constitutional: Negative for chills, fever and weight loss.  HENT: Negative for ear discharge, ear pain and nosebleeds.   Eyes: Negative for blurred vision, pain and discharge.  Respiratory: Negative for sputum production, shortness of breath, wheezing and stridor.   Cardiovascular: Negative for chest pain, palpitations, orthopnea and PND.   Gastrointestinal: Negative for abdominal pain, diarrhea, nausea and vomiting.  Genitourinary: Negative for frequency and urgency.  Musculoskeletal: Positive for joint pain. Negative for back pain.  Neurological: Positive for seizures and weakness. Negative for sensory change, speech change and focal weakness.  Psychiatric/Behavioral: Negative for depression and hallucinations. The patient is not nervous/anxious.      MEDICATIONS AT HOME:   Prior to Admission medications   Medication Sig Start Date End Date Taking? Authorizing Provider  albuterol (PROVENTIL HFA;VENTOLIN HFA) 108 (90 BASE) MCG/ACT inhaler Inhale 2 puffs into the lungs every 4 (four) hours as needed for wheezing or shortness of breath.   Yes [provider]  albuterol (PROVENTIL) (2.5 MG/3ML) 0.083% nebulizer solution Take 3 mLs (2.5 mg total) by nebulization every 4 (four) hours as needed for wheezing or shortness of breath. 09/23/17  Yes Darylene Price A, FNP  citalopram (CELEXA) 10 MG tablet Take 10 mg by mouth daily. 03/02/19  Yes [provider]  clopidogrel (PLAVIX) 75 MG tablet Take 1 tablet (75 mg total) by mouth daily. 07/17/16  Yes Vaughan Basta, MD  furosemide (LASIX) 40 MG tablet Take 40 mg by mouth daily. 07/22/18  Yes [provider]  lacosamide (VIMPAT) 50 MG TABS tablet Take 1 tablet (50 mg total) by mouth 2 (two) times daily. 08/08/19  Yes Thornell Mule, MD  losartan (COZAAR) 50 MG tablet Take 50 mg by mouth daily.  03/02/19  Yes [provider]  metoprolol succinate (TOPROL-XL) 25 MG 24 hr tablet Take 1 tablet (25 mg total) by mouth daily. 08/08/19  Yes Thornell Mule, MD  montelukast (SINGULAIR) 10 MG tablet Take 10 mg by mouth at bedtime.  03/18/16  Yes [provider]  omeprazole (PRILOSEC) 20 MG capsule Take 20 mg by mouth daily.   Yes [provider]  OXYGEN Inhale 2 L into the lungs at bedtime.   Yes [provider]  potassium chloride  (KLOR-CON) 10 MEQ tablet Take 2 tablets (20 mEq total) by mouth daily. 08/08/19 12/09/19 Yes Thornell Mule, MD  rosuvastatin (CRESTOR) 10 MG tablet Take 10 mg by mouth daily. 04/21/17  Yes [provider]  acetaminophen (TYLENOL) 325 MG tablet Take 2 tablets (650 mg total) by mouth every 6 (six) hours as needed for mild pain (or Fever >/= 101). Patient not taking: Reported on 12/07/2019 08/08/19   Thornell Mule, MD  acidophilus (RISAQUAD) CAPS capsule Take 1 capsule by mouth daily. Patient not taking: Reported on 12/09/2019 06/27/17   Gladstone Lighter, MD  docusate sodium (COLACE) 100 MG capsule Take 1 capsule (100 mg total) by mouth 2 (two) times daily as needed for mild constipation. Patient not taking: Reported on 12/09/2019 03/26/19   Nicholes Mango, MD  HYDROcodone-acetaminophen (NORCO/VICODIN) 5-325 MG tablet Take 1 tablet by mouth every 4 (four) hours as needed for moderate pain (pain score 4-6). 12/22/19   Carlynn Spry, PA-C  meclizine (ANTIVERT) 25 MG  tablet Take 1 tablet (25 mg total) by mouth 3 (three) times daily as needed for dizziness. Patient not taking: Reported on 12/07/2019 03/26/19   Nicholes Mango, MD  methocarbamol (ROBAXIN) 500 MG tablet Take 1 tablet (500 mg total) by mouth every 8 (eight) hours as needed for muscle spasms. Patient not taking: Reported on 12/07/2019 08/08/19   Thornell Mule, MD  ondansetron (ZOFRAN ODT) 8 MG disintegrating tablet Take 1 tablet (8 mg total) by mouth every 8 (eight) hours as needed for nausea or vomiting. Patient not taking: Reported on 12/07/2019 08/13/19   Arta Silence, MD  oxyCODONE (OXY IR/ROXICODONE) 5 MG immediate release tablet Take 1 tablet (5 mg total) by mouth every 6 (six) hours as needed for moderate pain or severe pain. Patient not taking: Reported on 12/07/2019 08/08/19   Thornell Mule, MD  polyethylene glycol (MIRALAX / GLYCOLAX) 17 g packet Take 17 g by mouth daily as needed. Patient not taking: Reported on 12/07/2019 08/08/19   Thornell Mule, MD      VITAL SIGNS:  Blood pressure (!) 128/51, pulse (!) 59, temperature 98.1 F (36.7 C), temperature source Oral, resp. rate 16, height _0  (1.6 m), weight 64 kg, SpO2 93 %.  PHYSICAL EXAMINATION:  GENERAL:  69 y.o.-year-old patient lying in the bed with no acute distress.  EYES: Pupils equal, round, reactive to light and accommodation. No scleral icterus.  HEENT: Head atraumatic, normocephalic. Oropharynx and nasopharynx clear.  NECK:  Supple, no jugular venous distention. No thyroid enlargement, no tenderness.  LUNGS: Normal breath sounds bilaterally, no wheezing, rales,rhonchi or crepitation. No use of accessory muscles of respiration.  CARDIOVASCULAR: S1, S2 normal. No murmurs, rubs, or gallops.  ABDOMEN: Soft, nontender, nondistended. Bowel sounds present. No organomegaly or mass.  EXTREMITIES: No pedal edema, cyanosis, or clubbing. Right shoulder sling present NEUROLOGIC: moves all extremities well grossly nonfocal  PSYCHIATRIC:patient is alert answers most questions with lipreading. She is a little bit sleepy with Ativan given recently SKIN: No obvious rash, lesion, or ulcer.   LABORATORY PANEL:   CBC Recent Labs  Lab 12/21/19 0738  HGB 12.2  HCT 36.0   ------------------------------------------------------------------------------------------------------------------  Chemistries  Recent Labs  Lab 12/21/19 0738 12/22/19 1232  NA 140  --   K 3.3*  --   CL 105  --   GLUCOSE 143*  --   BUN 29*  --   CREATININE 2.20*  --   MG  --  1.4*   ------------------------------------------------------------------------------------------------------------------  Cardiac Enzymes No results for input(s): TROPONINI in the last 168 hours. ------------------------------------------------------------------------------------------------------------------  RADIOLOGY:  DG Shoulder Right Port  Result Date: 12/21/2019 CLINICAL DATA:  Status post right shoulder  reverse arthroplasty. EXAM: PORTABLE RIGHT SHOULDER COMPARISON:  CT shoulder 11/22/2019 FINDINGS: Postoperative changes from right total shoulder arthroplasty identified. The hardware components are in anatomic alignment. Gas is identified within the joint space. No periprosthetic fracture or subluxation. IMPRESSION: Status post right total shoulder arthroplasty. Electronically Signed   By: Kerby Moors M.D.   On: 12/21/2019 11:42   [place] OR NERVE BLOCK-IMAGE ONLY [facility])  Result Date: 12/21/2019 There is no interpretation for this exam.  This order is for images obtained during a surgical procedure.  Please See "Surgeries" Tab for more information regarding the procedure.    EKG:   Orders placed or performed during the hospital encounter of 12/13/19  . EKG 12-Lead  . EKG 12-Lead    IMPRESSION AND PLAN:   [name]  is a 69 y.o. female with  a known history of known chronic seizure disorder, deafness, Gerd, depression, hypertension, non-alcoholic cirrhosis, COPD admitted on the orthopedic floor for reversal of right shoulder arthroplasty which was performed on 616 2021  Patient is on Vimpat for her known history of seizures. She had three seizures in 24 hours according to her nurse. Last seizure was around 4 PM.  1. Breakthrough seizure in a patient with a known history of chronic seizures -continue seizure precaution -IV PRN Ativan -continue impact as patient is able to-I will place neurology consultation for patient to be seen in the morning to modify if needed her seizure meds  2. Hypertension continue home meds  3. History of COPD continue oxygen chronic -continue bronchodilators  4. Known history of cirrhosis, nonalcoholic  5. History of CVA -resume Plavix if okay with ortho and statin  6. Jerrye Bushy continue PPI  Above was discussed with Dr. Harlow Mares     TOTAL TIME TAKING CARE OF THIS PATIENT: *45** minutes.    Fritzi Mandes M.D  [facility]    CC: Primary care  Physician: [facility]

## 2019-12-22 NOTE — Progress Notes (Signed)
PT Cancellation Note  Patient Details Name: [name] MRN: [id number] DOB: 28-Jul-1950   Cancelled Treatment:    Reason Eval/Treat Not Completed: Medical issues which prohibited therapy. Patient having another seizure this pm. RN called. Patient not appropriate for PT at this time. Will re-attempted when patient able to participate.     Stacia Feazell 12/22/2019, 2:07 PM

## 2019-12-22 NOTE — TOC Transition Note (Signed)
Transition of Care [facility]) - CM/SW Discharge Note   Patient Details  Name: [name] MRN: [id number] Date of Birth: 10-19-1950  Transition of Care [facility]) CM/SW Contact:  Elease Hashimoto, LCSW Phone Number: 12/22/2019, 8:36 AM   Clinical Narrative:   Met with pt who is deaf lives with son who is also her caregiver. Prior to admission she was independent with rw and cane. Will need to use cane now due to shoulder surgery. Her son dose the home management and trasnports her to appointments. Has had [facility] in the past and would like them again if will take her. Has all equipment. Ready for DC today.      Barriers to Discharge: No Barriers Identified   Patient Goals and CMS Choice   CMS Medicare.gov Compare Post Acute Care list provided to:: Patient Choice offered to / list presented to : Patient  Discharge Placement                       Discharge Plan and Services In-house Referral: Clinical Social Work   Post Acute Care Choice: Home Health                    HH Arranged: PT [facility] Agency: [facility] (now [facility]) Date [place]: 12/22/19 Time [place]: [phone] Representative spoke with at [place]: teresa  Social Determinants of Health ([place]) Interventions     Readmission Risk Interventions No flowsheet data found.

## 2019-12-22 NOTE — Progress Notes (Signed)
PT Cancellation Note  Patient Details Name: [name] MRN: [id number] DOB: 09-17-1950   Cancelled Treatment:    Reason Eval/Treat Not Completed: Fatigue/lethargy limiting ability to participate. Patient too lethargic this morning to participate in PT. Will re-attempt later.    Chailyn Racette 12/22/2019, 11:21 AM

## 2019-12-22 NOTE — Progress Notes (Signed)
OT Cancellation Note  Patient Details Name: [name] MRN: [id number] DOB: June 03, 1951   Cancelled Treatment:    Reason Eval/Treat Not Completed: Medical issues which prohibited therapy  Pt checked on second time this afternoon for OT evaluation. Pt's family is present and trying to assist pt to eat. Pt is observed by this author, PT and her family members, having a seizure. RN notified immediately. Pt not appropriate for therapy participation at this time. Will f/u at later date/time as able for OT evaluation. Thank you.  Gerrianne Scale, [place], OTR/L ascom [phone] 12/22/19, 2:11 PM

## 2019-12-22 NOTE — Progress Notes (Signed)
Pt had a seizure in the presence of PT and family members. RN gave 72m Ativan. Seizure precautions in place. Pt stable at this moment. Pt's Mg level 1.4. RN called JKurtis BushmanMD and MCarlynn SpryPA, and no response. RN messaging Providers now.

## 2019-12-22 NOTE — Progress Notes (Signed)
Pt having a seizure when RNs walked in for report. Pt unresponsive, O2 applied,  SpO2 applied and pt tilted on side.  This RN gave 48m Ativan IV. Pt VS taken, WNL besides O2 at 95 on 4 LNC. Pt opened eyes and tracked RN,  responded to painful stimuli. Pt drowsy. RN notified JKurtis BushmanMD about seizure episode. No new orders at this time. Charge Nurse came to room and aware of episode. RN in pt's room at this time.

## 2019-12-22 NOTE — Discharge Instructions (Signed)
   May shower with bandage in place.  If bandage becomes saturated, OK to removal and place band-aid.  You may be up walking around as tolerated   Pain medication can cause constipation.  You should increase your fluid intake, increase your intake of high fiber foods and/or take Metamucil as needed for constipation.  You may shower.  You do NOT need to cover the dressing or incision site with plastic wrap.  The dressing or incision can get wet, but do not submerge under water.    Continue your physical therapy exercises at least twice daily.  It is a good idea to use an ice pack for 30 minutes after doing your exercises to reduce swelling.  Do not be surprised if you have increased pain at night.  This usually means you have been a little too active during the day and need to reduce your activities.  If you develop lower extremity swelling that does not improve after a night of elevation, please call the office.  This could be an early sign of a blood clot.  Call with questions, fever>101.5 degrees, shortness of breath or drainage from the wound  [phone]

## 2019-12-22 NOTE — Progress Notes (Signed)
OT Cancellation Note  Patient Details Name: [name] MRN: [id number] DOB: 10/14/1950   Cancelled Treatment:    Reason Eval/Treat Not Completed: Fatigue/lethargy limiting ability to participate  OT consult received and chart reviewed. Attempted to see patient with PT this AM in prep for discharge, but pt was very fatigued with limited ability to keep eyes open for ASL interpreter communication. Not appropriate for therapy participation at this time. Will f/u at later time as able for OT evaluation.   Gerrianne Scale, [place], OTR/L ascom [phone] 12/22/19, 10:05 AM

## 2019-12-23 DIAGNOSIS — E876 Hypokalemia: Secondary | ICD-10-CM

## 2019-12-23 DIAGNOSIS — R569 Unspecified convulsions: Secondary | ICD-10-CM

## 2019-12-23 DIAGNOSIS — Z96611 Presence of right artificial shoulder joint: Secondary | ICD-10-CM

## 2019-12-23 LAB — BASIC METABOLIC PANEL
Anion gap: 9 (ref 5–15)
BUN: 12 mg/dL (ref 8–23)
CO2: 29 mmol/L (ref 22–32)
Calcium: 8.4 mg/dL — ABNORMAL LOW (ref 8.9–10.3)
Chloride: 101 mmol/L (ref 98–111)
Creatinine, Ser: 0.55 mg/dL (ref 0.44–1.00)
GFR calc Af Amer: >60 mL/min (ref >60)
GFR calc non Af Amer: >60 mL/min (ref >60)
Glucose, Bld: 164 mg/dL — ABNORMAL HIGH (ref 70–99)
Potassium: 3.2 mmol/L — ABNORMAL LOW (ref 3.5–5.1)
Sodium: 139 mmol/L (ref 135–145)

## 2019-12-23 LAB — MAGNESIUM: Magnesium: 1.4 mg/dL — ABNORMAL LOW (ref 1.7–2.4)

## 2019-12-23 MED ORDER — MAGNESIUM SULFATE 4 GM/100ML IV SOLN
4.0000 g | Freq: Once | INTRAVENOUS | Status: AC
  Administered 2019-12-23: 4 g via INTRAVENOUS
  Filled 2019-12-23: qty 100

## 2019-12-23 MED ORDER — POTASSIUM CHLORIDE CRYS ER 20 MEQ PO TBCR: 40.0000 meq | EXTENDED_RELEASE_TABLET | Freq: Once | ORAL | Status: DC

## 2019-12-23 MED ORDER — LACOSAMIDE 100 MG PO TABS: 100.0000 mg | ORAL_TABLET | Freq: Two times a day (BID) | ORAL | 0 refills | Status: DC

## 2019-12-23 MED ORDER — LACOSAMIDE 50 MG PO TABS
100.0000 mg | ORAL_TABLET | Freq: Two times a day (BID) | ORAL | Status: DC
  Administered 2019-12-23 – 2019-12-24 (×2): 100 mg via ORAL
  Filled 2019-12-23 (×2): qty 2

## 2019-12-23 MED ORDER — POTASSIUM CHLORIDE CRYS ER 20 MEQ PO TBCR
40.0000 meq | EXTENDED_RELEASE_TABLET | Freq: Once | ORAL | Status: AC
  Administered 2019-12-23: 40 meq via ORAL
  Filled 2019-12-23: qty 2

## 2019-12-23 MED ORDER — HYDRALAZINE HCL 25 MG PO TABS
25.0000 mg | ORAL_TABLET | Freq: Three times a day (TID) | ORAL | Status: DC
  Administered 2019-12-23 – 2019-12-24 (×3): 25 mg via ORAL
  Filled 2019-12-23 (×3): qty 1

## 2019-12-23 NOTE — Consult Note (Signed)
Reason for Consult: seizures Requesting Physician: Dr. Posey Pronto   CC: seizures   HPI: [name] is an 69 y.o. female well known to Neurology service with hx of seizures on Vimpat 50 BID, prior falls, shoulder pain, COPD admitted with seizure episode.  Now back to baseline.    Past Medical History:  Diagnosis Date  . Asthma   . CHF (congestive heart failure) ([place])   . Cirrhosis, non-alcoholic ([place])   . COPD (chronic obstructive pulmonary disease) ([place])   . Deaf   . Depression   . GERD (gastroesophageal reflux disease)   . Heart murmur   . Hepatitis   . Hypertension   . Lymph node disorder    arm  . Neuropathy   . On home oxygen therapy    hs  . Orthopnea   . RLS (restless legs syndrome)   . Seizures ([place])   . Shortness of breath dyspnea   . Sleep apnea   . Stroke [facility])    tia    Past Surgical History:  Procedure Laterality Date  . CATARACT EXTRACTION W/PHACO Right 11/23/2014   Procedure: CATARACT EXTRACTION PHACO AND INTRAOCULAR LENS PLACEMENT (IOC);  Surgeon: Lyla Glassing, MD;  Location: [facility] ORS;  Service: Ophthalmology;  Laterality: Right;  . CATARACT EXTRACTION W/PHACO Left 12/14/2014   Procedure: CATARACT EXTRACTION PHACO AND INTRAOCULAR LENS PLACEMENT (IOC);  Surgeon: Lyla Glassing, MD;  Location: [facility] ORS;  Service: Ophthalmology;  Laterality: Left;  US:01:16.6 AP:15.8 CDE:12.14  . CESAREAN SECTION    . CHOLECYSTECTOMY    . REVERSE SHOULDER ARTHROPLASTY Right 12/21/2019   Procedure: REVERSE SHOULDER ARTHROPLASTY;  Surgeon: Lovell Sheehan, MD;  Location: [facility] ORS;  Service: Orthopedics;  Laterality: Right;  . THUMB ARTHROSCOPY    . TONSILLECTOMY    . TYMPANOPLASTY     muliple    Family History  Problem Relation Age of Onset  . Lung cancer Mother   . CAD Father     Social History:  reports that she has been smoking cigarettes. She started smoking about 51 years ago. She has a 25.00 pack-year smoking history. She has never used smokeless tobacco. She  reports that she does not drink alcohol and does not use drugs.  Allergies  Allergen Reactions  . Aspirin Itching  . Celebrex [Celecoxib] Itching    itching  . Ciprofloxacin Itching  . Codeine Itching  . Fosphenytoin Itching  . Levaquin [Levofloxacin In D5w] Itching  . Levofloxacin Itching  . Lovastatin Itching  . Penicillins     Documentation indicates severe reaction  Pt tolerated cephalosporin without adverse reaction 09/18   . Pravastatin Itching  . Sulfa Antibiotics Itching    Medications: I have reviewed the patient's current medications.    Physical Examination: Blood pressure (!) 162/61, pulse (!) 57, temperature 97.7 F (36.5 C), temperature source Oral, resp. rate 18, height 5' 3" (1.6 m), weight 64 kg, SpO2 95 %.    Neurological Examination   Mental Status: Alert to name Cranial Nerves: II: Discs flat bilaterally; Visual fields grossly normal, pupils equal, round, reactive to light and accommodation V,VII: smile symmetric, facial light touch sensation normal bilaterally XI: bilateral shoulder shrug with R side pain  XII: midline tongue extension Motor: Generalized weakness more so RUE due to pain  Sensory: Pinprick and light touch intact throughout, bilaterally Deep Tendon Reflexes: 1+    Laboratory Studies:   Basic Metabolic Panel: Recent Labs  Lab 12/21/19 0738 12/22/19 1232 12/23/19 0914  NA 140  --  139  K 3.3*  --  3.2*  CL 105  --  101  CO2  --   --  29  GLUCOSE 143*  --  164*  BUN 29*  --  12  CREATININE 2.20*  --  0.55  CALCIUM  --   --  8.4*  MG  --  1.4* 1.4*    Liver Function Tests: No results for input(s): AST, ALT, ALKPHOS, BILITOT, PROT, ALBUMIN in the last 168 hours. No results for input(s): LIPASE, AMYLASE in the last 168 hours. No results for input(s): AMMONIA in the last 168 hours.  CBC: Recent Labs  Lab 12/21/19 0738  HGB 12.2  HCT 36.0    Cardiac Enzymes: No results for input(s): CKTOTAL, CKMB, CKMBINDEX,  TROPONINI in the last 168 hours.  BNP: Invalid input(s): POCBNP  CBG: Recent Labs  Lab 12/21/19 1104 12/21/19 2112  GLUCAP 210* 10*    Microbiology: Results for orders placed or performed during the hospital encounter of 12/21/19  Surgical pcr screen     Status: Abnormal   Collection Time: 12/21/19  7:54 AM   Specimen: Nasal Mucosa; Nasal Swab  Result Value Ref Range Status   MRSA, PCR NEGATIVE NEGATIVE Final   Staphylococcus aureus POSITIVE (A) NEGATIVE Final    Comment: (NOTE) The Xpert SA Assay (FDA approved for NASAL specimens in patients 40 years of age and older), is one component of a comprehensive surveillance program. It is not intended to diagnose infection nor to guide or monitor treatment. Performed at [facility], [address] [place], [place] [address]     Coagulation Studies: No results for input(s): LABPROT, INR in the last 72 hours.  Urinalysis: No results for input(s): COLORURINE, LABSPEC, PHURINE, GLUCOSEU, HGBUR, BILIRUBINUR, KETONESUR, PROTEINUR, UROBILINOGEN, NITRITE, LEUKOCYTESUR in the last 168 hours.  Invalid input(s): APPERANCEUR  Lipid Panel:     Component Value Date/Time   TRIG 133 04/29/2017 1523    HgbA1C:  Lab Results  Component Value Date   HGBA1C 7.9 (H) 08/05/2019    Urine Drug Screen:      Component Value Date/Time   LABOPIA NONE DETECTED 04/29/2017 1312   COCAINSCRNUR NONE DETECTED 04/29/2017 1312   LABBENZ NONE DETECTED 04/29/2017 1312   AMPHETMU NONE DETECTED 04/29/2017 1312   THCU NONE DETECTED 04/29/2017 1312   LABBARB NONE DETECTED 04/29/2017 1312    Alcohol Level: No results for input(s): ETH in the last 168 hours.  Other results: EKG: normal EKG, normal sinus rhythm, unchanged from previous tracings.  Imaging: DG Shoulder Right Port  Result Date: 12/21/2019 CLINICAL DATA:  Status post right shoulder reverse arthroplasty. EXAM: PORTABLE RIGHT SHOULDER COMPARISON:  CT shoulder 11/22/2019  FINDINGS: Postoperative changes from right total shoulder arthroplasty identified. The hardware components are in anatomic alignment. Gas is identified within the joint space. No periprosthetic fracture or subluxation. IMPRESSION: Status post right total shoulder arthroplasty. Electronically Signed   By: Kerby Moors M.D.   On: 12/21/2019 11:42     Assessment/Plan:  69 y.o. female well known to Neurology service with hx of seizures on Vimpat 50 BID, prior falls, shoulder pain, COPD admitted with seizure episode.  Now back to baseline.    - No need for further imaging given the known hx  - Increase Vimpat to 100 BID and d/c today - follow up with Dr. Manuella Ghazi as out patient 12/23/2019, 10:45 AM

## 2019-12-23 NOTE — Progress Notes (Signed)
  Subjective:  Patient reports pain as moderate to severe.  Pain increased from yesterday.  Objective:   VITALS:   Vitals:   12/23/19 0725 12/23/19 0810 12/23/19 0855 12/23/19 0927  BP:  (!) 184/67 (!) 185/57 (!) 162/61  Pulse:   (!) 56 (!) 57  Resp:   18   Temp:   97.7 F (36.5 C)   TempSrc:   Oral   SpO2: 93%  95%   Weight:      Height:        PHYSICAL EXAM:  Neurologically intact ABD soft Neurovascular intact Sensation intact distally Intact pulses distally Dorsiflexion/Plantar flexion intact Incision: dressing C/D/I No cellulitis present Compartment soft  LABS  Results for orders placed or performed during the hospital encounter of 12/21/19 (from the past 24 hour(s))  Magnesium     Status: Abnormal   Collection Time: 12/23/19  9:14 AM  Result Value Ref Range   Magnesium 1.4 (L) 1.7 - 2.4 mg/dL  Basic metabolic panel     Status: Abnormal   Collection Time: 12/23/19  9:14 AM  Result Value Ref Range   Sodium 139 135 - 145 mmol/L   Potassium 3.2 (L) 3.5 - 5.1 mmol/L   Chloride 101 98 - 111 mmol/L   CO2 29 22 - 32 mmol/L   Glucose, Bld 164 (H) 70 - 99 mg/dL   BUN 12 8 - 23 mg/dL   Creatinine, Ser 0.55 0.44 - 1.00 mg/dL   Calcium 8.4 (L) 8.9 - 10.3 mg/dL   GFR calc non Af Amer >60 >60 mL/min   GFR calc Af Amer >60 >60 mL/min   Anion gap 9 5 - 15    No results found.  Assessment/Plan: 2 Days Post-Op   Active Problems:   Seizure ([place])   S/P reverse total shoulder arthroplasty, right   Advance diet Up with therapy  Well known to Neurology service with hx of seizures on Vimpat 50 BID, prior falls, shoulder pain, COPD admitted with seizure episode.  Now back to baseline.    - No need for further imaging given the known hx  - Increase Vimpat to 100 BID and d/c today - follow up with Dr. Manuella Ghazi as out patient  Continue Pain control.  Plan to discharge home tomorrow.  Follow up in 2 weeks for staple removal. Call to confirm appointment [place] , PA-C 12/23/2019, 12:45 PM

## 2019-12-23 NOTE — Progress Notes (Addendum)
Pharmacy Electrolyte Monitoring Consult:  Pharmacy consulted to assist in monitoring and replacing electrolytes in this 69 y.o. female admitted on 12/21/2019 with No chief complaint on file.   Labs:  Sodium (mmol/L)  Date Value  12/21/2019 140  11/05/2013 139   Potassium (mmol/L)  Date Value  12/21/2019 3.3 (L)  11/05/2013 3.3 (L)   Magnesium (mg/dL)  Date Value  12/23/2019 1.4 (L)   Phosphorus (mg/dL)  Date Value  08/05/2019 3.8   Calcium (mg/dL)  Date Value  12/13/2019 8.8 (L)   Calcium, Total (mg/dL)  Date Value  11/05/2013 8.9   Albumin (g/dL)  Date Value  08/13/2019 3.0 (L)  11/05/2013 3.4    Assessment: Pharmacy consulted to supplement Mag. Mag  1.4 - no other labs available at this time  SCr 2.20 on 6/16 Mag 1.4 on 6/17 with mag sulfate 2 g IV x1 given   Plan: Will order mag sulfate 4 g IV x1 for today Recheck Mag level in AM  Pharmacy will continue to follow.   Rocky Morel 12/23/2019 9:54 AM                         Addendum: K 3.2 - pt currently on KCl 20 mEq PO daily Will order an additional 40 mEq PO x1 for today   Rayna Sexton, PharmD, BCPS Clinical Pharmacist 12/23/2019 2:08 PM

## 2019-12-23 NOTE — Progress Notes (Signed)
Physical Therapy Treatment Patient Details Name: [name] MRN: [id number] DOB: May 16, 1951 Today's Date: 12/23/2019    History of Present Illness Patient is s/p reverse total shoulder. PMH includes: HTN, HLD, DM, COPD, uses O2 at night, CHF    PT Comments    Patient received in bed, agrees to PT despite lethargy. She is roused by light touch. Performed bed mobility with min guard. Sit to stand with min assist. Requires cues due to slight impulsivity and grogginess. She ambulated 30 feet with +2 assist for equipment/safety and began having seizure. Became rigid with upward fixed gaze. Patient was then assisted to chair for safety. RN notified and assisted. Patient returned to bed with max +1 SPT. Patient very lethargic following. Patient will continue to benefit from skilled PT while here to improve mobility, strength and safety. Decreased frequency to 1x per day due to lethargy, seizures.        Follow Up Recommendations  Home health PT;Supervision/Assistance - 24 hour     Equipment Recommendations  None recommended by PT    Recommendations for Other Services       Precautions / Restrictions Precautions Precautions: Fall;Shoulder;Other (comment) (seizure) Shoulder Interventions: Shoulder sling/immobilizer Restrictions Weight Bearing Restrictions: Yes RUE Weight Bearing: Non weight bearing    Mobility  Bed Mobility Overal bed mobility: Needs Assistance Bed Mobility: Supine to Sit;Sit to Supine     Supine to sit: Min guard Sit to supine: +2 for physical assistance;Mod assist   General bed mobility comments: min guard assist for supine to sit ffrom elevated HOB for safety due to decreased arousal/alertness, patient required increased assist returning to bed as she had a seizure during session.  Transfers Overall transfer level: Needs assistance Equipment used: None Transfers: Sit to/from [facility] Sit to Stand: Min assist Stand pivot transfers: Max  assist       General transfer comment: Patient transfered sit to stand with min assist for steadying, due to seizure activity required max assist to pivot from chair back to bed  Ambulation/Gait Ambulation/Gait assistance: Min assist Gait Distance (Feet): 30 Feet Assistive device: 1 person hand held assist Gait Pattern/deviations: Decreased step length - right;Decreased step length - left;Staggering right;Staggering left;Shuffle Gait velocity: decreased   General Gait Details: Patient unsteady with ambulation. Staggering due to lethargy. After ambulating 30 feet began having seizure and became very rigid with upward gaze. Assist patient to chair. Returned to bed with max assist.   Stairs             Wheelchair Mobility    Modified Rankin (Stroke Patients Only)       Balance Overall balance assessment: Needs assistance Sitting-balance support: Feet supported Sitting balance-Leahy Scale: Fair Sitting balance - Comments: no obvious truncal sway   Standing balance support: Single extremity supported;During functional activity Standing balance-Leahy Scale: Fair Standing balance comment: unsteady; postural sway noted in all directions requiring min A for steadying                            Cognition Arousal/Alertness: Lethargic Behavior During Therapy: WFL for tasks assessed/performed Overall Cognitive Status: Impaired/Different from baseline Area of Impairment: Awareness;Memory;Following commands                   Current Attention Level: Selective Memory: Decreased short-term memory Following Commands: Follows one step commands with increased time   Awareness: Emergent Problem Solving: Slow processing;Difficulty sequencing;Requires verbal cues;Requires tactile cues General Comments: Patient  very groggy this pm, but agreeable to PT session, wakes to light touch      Exercises      General Comments        Pertinent Vitals/Pain Pain  Assessment: Faces Pain Score: 5  Faces Pain Scale: Hurts little more Pain Location: R shoulder Pain Descriptors / Indicators: Discomfort;Operative site guarding;Sore Pain Intervention(s): Monitored during session    Home Living                      Prior Function            PT Goals (current goals can now be found in the care plan section) Acute Rehab PT Goals Patient Stated Goal: none stated PT Goal Formulation: Patient unable to participate in goal setting Time For Goal Achievement: 12/28/19 Progress towards PT goals: Not progressing toward goals - comment (difficulty making progress due to seizures and lethargy)    Frequency    7X/week      PT Plan Discharge plan needs to be updated    Co-evaluation              AM-PAC PT "6 Clicks" Mobility   Outcome Measure  Help needed turning from your back to your side while in a flat bed without using bedrails?: A Little Help needed moving from lying on your back to sitting on the side of a flat bed without using bedrails?: A Little Help needed moving to and from a bed to a chair (including a wheelchair)?: A Little Help needed standing up from a chair using your arms (e.g., wheelchair or bedside chair)?: A Little Help needed to walk in hospital room?: A Little Help needed climbing 3-5 steps with a railing? : A Lot 6 Click Score: 17    End of Session Equipment Utilized During Treatment: Gait belt;Oxygen Activity Tolerance: Patient limited by lethargy;Treatment limited secondary to medical complications (Comment) (seizure) Patient left: in bed;with bed alarm set;with nursing/sitter in room Nurse Communication: Mobility status;Other (comment) (seizure activity during session) PT Visit Diagnosis: Other abnormalities of gait and mobility (R26.89);Unsteadiness on feet (R26.81);History of falling (Z91.81);Muscle weakness (generalized) (M62.81);Pain;Difficulty in walking, not elsewhere classified (R26.2) Pain -  Right/Left: Right Pain - part of body: Shoulder     Time: 0757-3225 PT Time Calculation (min) (ACUTE ONLY): 26 min  Charges:  $Gait Training: 8-22 mins                     [employer], PT, GCS 12/23/19,3:21 PM

## 2019-12-23 NOTE — Progress Notes (Signed)
Physical Therapy Treatment Patient Details Name: [name] MRN: [id number] DOB: Oct 02, 1950 Today's Date: 12/23/2019    History of Present Illness Patient is s/p reverse total shoulder. PMH includes: HTN, HLD, DM, COPD, uses O2 at night, CHF    PT Comments    Pt lying in bed upon arrival to room. Pt able to perform supine to sit from elevated bed with min guard A for safety. TFs from bed and BSC required min A for steadying. Pt ambulated 50 feet with left HHA/min A for steadying as pt with decreased steadiness. Noted truncal sway when static standing requiring min A for balance.Pt desat to 84% on room air during ambulation. Pt recovered to 91% after seated rest on BSC on 2L O2. Pt unaware of having had surgery two days ago, of seizures yesterday, and that today is Friday. Limiting factors for session include oxygen desaturation and decreased arousal/awareness of pt. Will continue to follow and progress pt as tolerated. ASL virtual interpreter Clarise Cruz [phone] utilized for session.  Follow Up Recommendations  Home health PT;Supervision/Assistance - 24 hour     Equipment Recommendations  None recommended by PT    Recommendations for Other Services       Precautions / Restrictions Precautions Precautions: Fall;Shoulder Shoulder Interventions: Shoulder sling/immobilizer Restrictions Weight Bearing Restrictions: Yes RUE Weight Bearing: Non weight bearing    Mobility  Bed Mobility Overal bed mobility: Needs Assistance Bed Mobility: Supine to Sit     Supine to sit: Min guard;HOB elevated     General bed mobility comments: min guard assist for supine to sit ffrom elevated HOB for safety due to decreased arousal/alertness  Transfers Overall transfer level: Needs assistance Equipment used: 1 person hand held assist Transfers: Sit to/from Stand Sit to Stand: Min assist         General transfer comment: min A for steadying for transfer from bed, to/from [facility], and to recliner  chair  Ambulation/Gait Ambulation/Gait assistance: Min assist Gait Distance (Feet): 50 Feet Assistive device: 1 person hand held assist Gait Pattern/deviations: Decreased step length - right;Decreased step length - left Gait velocity: decreased   General Gait Details: pt required min A with handheld assist on L for steadying; pt ambulated on room air and desat to 84%; decreased SpO2 is limiting factor   Stairs             Wheelchair Mobility    Modified Rankin (Stroke Patients Only)       Balance Overall balance assessment: Needs assistance Sitting-balance support: Feet supported Sitting balance-Leahy Scale: Good Sitting balance - Comments: no obvious truncal sway   Standing balance support: Single extremity supported Standing balance-Leahy Scale: Fair Standing balance comment: unsteady; postural sway noted in all directions requiring min A for steadying                            Cognition Arousal/Alertness: Lethargic Behavior During Therapy: WFL for tasks assessed/performed Overall Cognitive Status: Within Functional Limits for tasks assessed Area of Impairment: Awareness;Memory                     Memory: Decreased short-term memory Following Commands: Follows multi-step commands consistently       General Comments: Pt unaware of the day and if she had already had surgery on her shoulder or not.       Exercises      General Comments        Pertinent Vitals/Pain  Pain Assessment: 0-10 Pain Score: 5  Pain Location: R shoulder Pain Descriptors / Indicators: Discomfort;Operative site guarding;Sore Pain Intervention(s): Limited activity within patient's tolerance;Monitored during session    Home Living                      Prior Function            PT Goals (current goals can now be found in the care plan section) Acute Rehab PT Goals Patient Stated Goal: none stated PT Goal Formulation: Patient unable to participate  in goal setting Time For Goal Achievement: 12/28/19 Progress towards PT goals: Progressing toward goals    Frequency    BID      PT Plan Current plan remains appropriate    Co-evaluation              AM-PAC PT "6 Clicks" Mobility   Outcome Measure  Help needed turning from your back to your side while in a flat bed without using bedrails?: A Little Help needed moving from lying on your back to sitting on the side of a flat bed without using bedrails?: A Little Help needed moving to and from a bed to a chair (including a wheelchair)?: A Little Help needed standing up from a chair using your arms (e.g., wheelchair or bedside chair)?: A Little Help needed to walk in hospital room?: A Little Help needed climbing 3-5 steps with a railing? : A Lot 6 Click Score: 17    End of Session Equipment Utilized During Treatment: Gait belt Activity Tolerance: Patient limited by lethargy;Patient limited by pain Patient left: in chair;Other (comment) (OT present in room) Nurse Communication: Mobility status PT Visit Diagnosis: Other abnormalities of gait and mobility (R26.89);Unsteadiness on feet (R26.81);History of falling (Z91.81);Muscle weakness (generalized) (M62.81);Pain;Difficulty in walking, not elsewhere classified (R26.2) Pain - Right/Left: Right Pain - part of body: Shoulder     Time: 1110-1131 PT Time Calculation (min) (ACUTE ONLY): 21 min  Charges:                        Vale Haven, SPT   Vale Haven 12/23/2019, 12:02 PM

## 2019-12-23 NOTE — Discharge Summary (Signed)
Physician Discharge Summary  Patient ID: [name] MRN: [id number] DOB/AGE: Jan 24, 1951 69 y.o.  Admit date: 12/21/2019 Discharge date: 12/23/2019  Admission Diagnoses:  M19.011 Primary osteoarthritis, right shoulder <principal problem not specified>  Discharge Diagnoses:  M19.011 Primary osteoarthritis, right shoulder Active Problems:   Seizure ([place])   S/P reverse total shoulder arthroplasty, right   Past Medical History:  Diagnosis Date  . Asthma   . CHF (congestive heart failure) ([place])   . Cirrhosis, non-alcoholic ([place])   . COPD (chronic obstructive pulmonary disease) ([place])   . Deaf   . Depression   . GERD (gastroesophageal reflux disease)   . Heart murmur   . Hepatitis   . Hypertension   . Lymph node disorder    arm  . Neuropathy   . On home oxygen therapy    hs  . Orthopnea   . RLS (restless legs syndrome)   . Seizures ([place])   . Shortness of breath dyspnea   . Sleep apnea   . Stroke [facility])    tia    Surgeries: Procedure(s): REVERSE SHOULDER ARTHROPLASTY on 12/21/2019   Consultants (if any): Treatment Team:  Leotis Pain, MD  Discharged Condition: Improved  Hospital Course: [name] is an 69 y.o. female who was admitted 12/21/2019 with a diagnosis of  M19.011 Primary osteoarthritis, right shoulder <principal problem not specified> and went to the operating room on 12/21/2019 and underwent the above named procedures.    She was given perioperative antibiotics:  Anti-infectives (From admission, onward)   Start     Dose/Rate Route Frequency Ordered Stop   12/21/19 1500  clindamycin (CLEOCIN) IVPB 600 mg        600 mg 100 mL/hr over 30 Minutes Intravenous Every 6 hours 12/21/19 1228 12/22/19 0329   12/21/19 0912  50,000 units bacitracin in 0.9% normal saline 250 mL irrigation  Status:  Discontinued          As needed 12/21/19 0912 12/21/19 1211   12/21/19 0649  clindamycin (CLEOCIN) 900 MG/50ML IVPB       Note to Pharmacy: Register, Karen   :  cabinet override      12/21/19 0649 12/21/19 0905   12/21/19 0600  clindamycin (CLEOCIN) IVPB 900 mg        900 mg 100 mL/hr over 30 Minutes Intravenous On call to O.R. 12/21/19 0156 12/21/19 0900    .  She was given sequential compression devices, early ambulation, and Plavix for DVT prophylaxis.  She benefited maximally from the hospital stay and there were no complications.    Recent vital signs:  Vitals:   12/23/19 0855 12/23/19 0927  BP: (!) 185/57 (!) 162/61  Pulse: (!) 56 (!) 57  Resp: 18   Temp: 97.7 F (36.5 C)   SpO2: 95%     Recent laboratory studies:  Lab Results  Component Value Date   HGB 12.2 12/21/2019   HGB 14.8 12/13/2019   HGB 12.6 08/13/2019   Lab Results  Component Value Date   WBC 7.5 12/13/2019   PLT 266 12/13/2019   Lab Results  Component Value Date   INR 1.0 12/13/2019   Lab Results  Component Value Date   NA 139 12/23/2019   K 3.2 (L) 12/23/2019   CL 101 12/23/2019   CO2 29 12/23/2019   BUN 12 12/23/2019   CREATININE 0.55 12/23/2019   GLUCOSE 164 (H) 12/23/2019    Discharge Medications:   Allergies as of 12/23/2019  Reactions   Aspirin Itching   Celebrex [celecoxib] Itching   itching   Ciprofloxacin Itching   Codeine Itching   Fosphenytoin Itching   Levaquin [levofloxacin In D5w] Itching   Levofloxacin Itching   Lovastatin Itching   Penicillins    Documentation indicates severe reaction Pt tolerated cephalosporin without adverse reaction 09/18   Pravastatin Itching   Sulfa Antibiotics Itching      Medication List    STOP taking these medications   oxyCODONE 5 MG immediate release tablet Commonly known as: Oxy IR/ROXICODONE     TAKE these medications   acetaminophen 325 MG tablet Commonly known as: TYLENOL Take 2 tablets (650 mg total) by mouth every 6 (six) hours as needed for mild pain (or Fever >/= 101).   acidophilus Caps capsule Take 1 capsule by mouth daily.   albuterol 108 (90 Base) MCG/ACT  inhaler Commonly known as: VENTOLIN HFA Inhale 2 puffs into the lungs every 4 (four) hours as needed for wheezing or shortness of breath.   albuterol (2.5 MG/3ML) 0.083% nebulizer solution Commonly known as: PROVENTIL Take 3 mLs (2.5 mg total) by nebulization every 4 (four) hours as needed for wheezing or shortness of breath.   citalopram 10 MG tablet Commonly known as: CELEXA Take 10 mg by mouth daily.   clopidogrel 75 MG tablet Commonly known as: PLAVIX Take 1 tablet (75 mg total) by mouth daily.   docusate sodium 100 MG capsule Commonly known as: COLACE Take 1 capsule (100 mg total) by mouth 2 (two) times daily as needed for mild constipation.   furosemide 40 MG tablet Commonly known as: LASIX Take 40 mg by mouth daily.   HYDROcodone-acetaminophen 5-325 MG tablet Commonly known as: NORCO/VICODIN Take 1 tablet by mouth every 4 (four) hours as needed for moderate pain (pain score 4-6).   Lacosamide 100 MG Tabs Take 1 tablet (100 mg total) by mouth 2 (two) times daily. What changed:   medication strength  how much to take   losartan 50 MG tablet Commonly known as: COZAAR Take 50 mg by mouth daily.   meclizine 25 MG tablet Commonly known as: ANTIVERT Take 1 tablet (25 mg total) by mouth 3 (three) times daily as needed for dizziness.   methocarbamol 500 MG tablet Commonly known as: ROBAXIN Take 1 tablet (500 mg total) by mouth every 8 (eight) hours as needed for muscle spasms.   metoprolol succinate 25 MG 24 hr tablet Commonly known as: TOPROL-XL Take 1 tablet (25 mg total) by mouth daily.   montelukast 10 MG tablet Commonly known as: SINGULAIR Take 10 mg by mouth at bedtime.   omeprazole 20 MG capsule Commonly known as: PRILOSEC Take 20 mg by mouth daily.   ondansetron 8 MG disintegrating tablet Commonly known as: Zofran ODT Take 1 tablet (8 mg total) by mouth every 8 (eight) hours as needed for nausea or vomiting.   OXYGEN Inhale 2 L into the lungs at  bedtime.   polyethylene glycol 17 g packet Commonly known as: MIRALAX / GLYCOLAX Take 17 g by mouth daily as needed.   potassium chloride 10 MEQ tablet Commonly known as: KLOR-CON Take 2 tablets (20 mEq total) by mouth daily.   rosuvastatin 10 MG tablet Commonly known as: CRESTOR Take 10 mg by mouth daily.       Diagnostic Studies: DG Shoulder Right Port  Result Date: 12/21/2019 CLINICAL DATA:  Status post right shoulder reverse arthroplasty. EXAM: PORTABLE RIGHT SHOULDER COMPARISON:  CT shoulder 11/22/2019 FINDINGS: Postoperative  changes from right total shoulder arthroplasty identified. The hardware components are in anatomic alignment. Gas is identified within the joint space. No periprosthetic fracture or subluxation. IMPRESSION: Status post right total shoulder arthroplasty. Electronically Signed   By: Kerby Moors M.D.   On: 12/21/2019 11:42   [place] OR NERVE BLOCK-IMAGE ONLY [facility])  Result Date: 12/21/2019 There is no interpretation for this exam.  This order is for images obtained during a surgical procedure.  Please See "Surgeries" Tab for more information regarding the procedure.    Disposition: Discharge disposition: 01-Home or Self Care          Follow-up Information    Carlynn Spry, PA-C On 01/03/2020.   Specialty: Orthopedic Surgery Why: @ 1:45 pm Contact information: [place] [address] [phone]                Signed: Carlynn Spry ,Hershal Coria 12/23/2019, 12:50 PM

## 2019-12-23 NOTE — Progress Notes (Signed)
RN called to assist PT in hallway, pt having a seizure. Pt sitting in chair when RN arrived. Pt rigid and eyes close, pt unresponsive. RN went to get IV Ativan, when RN came back pt was looking around room, seizure had passed. VS taken BP had dropped to 80's/40's.  PT and RN moved pt to bed. Pt VS retaken and VS WNL besides chronic 2 LNC O2. Pt drowsy but a&o x4. Kurtis Bushman MD and Leotis Pain MD notified of seizure and episode. Dr. Irish Elders said to hold ativan, continue to monitor pt.

## 2019-12-23 NOTE — TOC Progression Note (Signed)
Transition of Care [facility]) - Progression Note    Patient Details  Name: [name] MRN: [id number] Date of Birth: 02/06/1951  Transition of [facility]) CM/SW Contact  Sunshine Mackowski, Gardiner Rhyme, LCSW Phone Number: 12/23/2019, 2:01 PM  Clinical Narrative:   [facility] called and can;t take her, family wants [facility]mings due to has used before and have reached out to [facility] and he can take the referral and will start MOnday. Has all equipment from before. Will touch base with son to make sure prepared for DC today.    Expected Discharge Plan: [place] Barriers to Discharge: No Barriers Identified  Expected Discharge Plan and Services Expected Discharge Plan: [place] In-house Referral: Clinical Social Work   Post Acute Care Choice: Lane arrangements for the past 2 months: Apartment Expected Discharge Date: 12/22/19                         HH Arranged: PT [place]: [place] [facility]) Date [place]: 12/22/19 Time [place]: [phone] Representative spoke with at [place]: teresa   Social Determinants of Health ([place]) Interventions    Readmission Risk Interventions No flowsheet data found.

## 2019-12-23 NOTE — Plan of Care (Signed)
  Problem: Education: Goal: Knowledge of the prescribed therapeutic regimen will improve Outcome: Progressing Goal: Understanding of activity limitations/precautions following surgery will improve Outcome: Progressing   Problem: Activity: Goal: Ability to tolerate increased activity will improve Outcome: Progressing   Problem: Pain Management: Goal: Pain level will decrease with appropriate interventions Outcome: Progressing   Problem: Education: Goal: Knowledge of General Education information will improve Description: Including pain rating scale, medication(s)/side effects and non-pharmacologic comfort measures Outcome: Progressing   Problem: Clinical Measurements: Goal: Ability to maintain clinical measurements within normal limits will improve Outcome: Progressing   Problem: Nutrition: Goal: Adequate nutrition will be maintained Outcome: Progressing   Problem: Safety: Goal: Ability to remain free from injury will improve Outcome: Progressing

## 2019-12-23 NOTE — Plan of Care (Signed)
  Problem: Activity: Goal: Ability to tolerate increased activity will improve Outcome: Progressing   Problem: Pain Management: Goal: Pain level will decrease with appropriate interventions Outcome: Progressing   Problem: Safety: Goal: Ability to remain free from injury will improve Outcome: Progressing

## 2019-12-23 NOTE — Evaluation (Signed)
Occupational Therapy Evaluation Patient Details Name: [name] MRN: [id number] DOB: Nov 17, 1950 Today's Date: 12/23/2019    History of Present Illness Patient is s/p reverse total shoulder. PMH includes: HTN, HLD, DM, COPD, uses O2 at night, CHF   Clinical Impression   Patient was seen for an OT evaluation this date. Pt lives with son in apt with level entry. Prior to initial injury, pt was active and independent. Has required some increased help while trying to recover with conservative measures since April 2021. Pt is NWB to R UE and has sling on to immobilize. Patient presents with impaired strength/ROM, and pain to R UE. These impairments result in a decreased ability to perform self care tasks requiring MOD/MAX assist for UB/LB dressing and bathing and MAX assist for application compression stockings and sling/immobilizer. Pt instructed in compression stockings mgt, sling/immobilizer mgt, ROM exercises for R UE elbow, wrist, and hand, R UE precautions, adaptive strategies for bathing/dressing/toileting/grooming, and positioning and considerations for sleep. In addition, OT instructs re: home/routines modifications to maximize falls prevention, safety, and independence. Handout provided. OT adjusted sling/immobilizer and polar care to improve comfort, optimize positioning, and to maximize skin integrity/safety. Pt verbalized understanding of all education/training provided. Pt will benefit from skilled OT services to address these limitations and improve independence in daily tasks. Recommend HHOT services to continue therapy to maximize return to PLOF, address home/routines modifications and safety, minimize falls risk, and minimize caregiver burden.      Follow Up Recommendations  Home health OT;Supervision - Intermittent    Equipment Recommendations  3 in 1 bedside commode    Recommendations for Other Services       Precautions / Restrictions Precautions Precautions:  Fall;Shoulder;Other (comment) (seizure) Shoulder Interventions: Shoulder sling/immobilizer Restrictions Weight Bearing Restrictions: Yes RUE Weight Bearing: Non weight bearing      Mobility Bed Mobility Overal bed mobility: Needs Assistance Bed Mobility: Supine to Sit     Supine to sit: Min guard Sit to supine: +2 for physical assistance;Mod assist   General bed mobility comments: min guard assist for supine to sit ffrom elevated HOB for safety due to decreased arousal/alertness, patient required increased assist returning to bed as she had a seizure during session.  Transfers Overall transfer level: Needs assistance Equipment used: None Transfers: Sit to/from [facility] Sit to Stand: Min assist Stand pivot transfers: Mod assist       General transfer comment: less steady with pivoting transfers.    Balance Overall balance assessment: Needs assistance Sitting-balance support: Feet supported Sitting balance-Leahy Scale: Fair Sitting balance - Comments: G-/F+ static sitting balance EOB unsupported   Standing balance support: Single extremity supported;During functional activity Standing balance-Leahy Scale: Fair Standing balance comment: requires UE support to sustain static stand, some need for physical assist for steadying.                           ADL either performed or assessed with clinical judgement   ADL Overall ADL's : Needs assistance/impaired Eating/Feeding: Supervision/ safety   Grooming: Wash/dry hands;Wash/dry face;Oral care;Set up;Sitting   Upper Body Bathing: Moderate assistance;Sitting   Lower Body Bathing: Moderate assistance;Maximal assistance;Sit to/from stand   Upper Body Dressing : Moderate assistance;Sitting   Lower Body Dressing: Moderate assistance;Maximal assistance;Sit to/from stand   Toilet Transfer: Magazine features editor Details (indicate cue type and reason): 1 person HHA Toileting- Clothing  Manipulation and Hygiene: Minimal assistance;Sit to/from stand Toileting - Water quality scientist Details (indicate  cue type and reason): SBA to CGA with voiding, MIN A after BM for thorough completion     Functional mobility during ADLs: Min guard (1 person HHA)       Vision Patient Visual Report: No change from baseline       Perception     Praxis      Pertinent Vitals/Pain Pain Assessment: Faces Faces Pain Scale: Hurts little more Pain Location: R shoulder Pain Descriptors / Indicators: Discomfort;Operative site guarding;Sore Pain Intervention(s): Monitored during session     Hand Dominance Right   Extremity/Trunk Assessment Upper Extremity Assessment Upper Extremity Assessment: RUE deficits/detail;LUE deficits/detail RUE Deficits / Details: able to flex/ext digits WFL, wrist flex/ext WFL, 2/3 sup/pronation arc of motion RUE: Unable to fully assess due to pain;Unable to fully assess due to immobilization LUE Deficits / Details: WFL ROM, strength grossly 4-/4 throughout.   Lower Extremity Assessment Lower Extremity Assessment: Defer to PT evaluation   Cervical / Trunk Assessment Cervical / Trunk Assessment: Normal   Communication Communication Communication: Deaf;Interpreter utilized   Cognition Arousal/Alertness: Lethargic (somewhat sleepy today, but improved versus initial PT eval) Behavior During Therapy: WFL for tasks assessed/performed Overall Cognitive Status: Within Functional Limits for tasks assessed                   Following Commands: Follows one step commands with increased time     General Comments       Exercises Other Exercises Other Exercises: OT facilitates education with pt re: bathing, dressing, sleeping and sling mgt considerations following R shld sx. ASL interpreter [facility] # [phone]) utilized throughout session and educational portion. Pt with moderate reception of education. Some grogginess.   Shoulder Instructions      Home  Living Family/patient expects to be discharged to:: Private residence Living Arrangements: Children (son) Available Help at Discharge: Family;Available PRN/intermittently Type of Home: Apartment Home Access: Level entry     Home Layout: One level     Bathroom Shower/Tub: Tub/shower unit;Curtain   Bathroom Toilet: Standard     Home Equipment: Grab bars - tub/shower;Grab bars - toilet;Walker - 4 wheels;Wheelchair - manual;Cane - single point;Shower seat   Additional Comments: Patient was not using walker or cane prior to admission      Prior Functioning/Environment Level of Independence: Independent        Comments: Pt reports being independent in self care ADLs; son would do cooking/cleaning;        OT Problem List: Decreased strength;Decreased range of motion;Decreased activity tolerance;Impaired balance (sitting and/or standing);Decreased coordination;Decreased knowledge of precautions;Impaired UE functional use      OT Treatment/Interventions: Self-care/ADL training;Therapeutic exercise;DME and/or AE instruction;Therapeutic activities;Patient/family education;Balance training    OT Goals(Current goals can be found in the care plan section) Acute Rehab OT Goals Patient Stated Goal: none stated OT Goal Formulation: With patient Time For Goal Achievement: 01/06/20 Potential to Achieve Goals: Good  OT Frequency: Min 2X/week   Barriers to D/C:            Co-evaluation PT/OT/SLP Co-Evaluation/Treatment: Yes Reason for Co-Treatment: Complexity of the patient's impairments (multi-system involvement);To address functional/ADL transfers PT goals addressed during session: Mobility/safety with mobility;Balance OT goals addressed during session: ADL's and self-care;Strengthening/ROM      AM-PAC OT "6 Clicks" Daily Activity     Outcome Measure Help from another person eating meals?: A Little Help from another person taking care of personal grooming?: A Little Help from  another person toileting, which includes using toliet, bedpan, or urinal?: A  Lot Help from another person bathing (including washing, rinsing, drying)?: A Lot Help from another person to put on and taking off regular upper body clothing?: A Lot Help from another person to put on and taking off regular lower body clothing?: A Lot 6 Click Score: 14   End of Session Equipment Utilized During Treatment: Gait belt Nurse Communication: Mobility status  Activity Tolerance: Patient tolerated treatment well Patient left: in chair;with call bell/phone within reach;with chair alarm set  OT Visit Diagnosis: Unsteadiness on feet (R26.81);Muscle weakness (generalized) (M62.81);History of falling (Z91.81)                Time: 0104-0459 OT Time Calculation (min): 41 min Charges:  OT Evaluation $OT Eval Moderate Complexity: 1 Mod OT Treatments $Self Care/Home Management : 8-22 mins $Therapeutic Activity: 8-22 mins  Gerrianne Scale, MS, OTR/L ascom [phone] 12/23/19, 5:10 PM

## 2019-12-23 NOTE — Progress Notes (Signed)
[place] [facility] [place] NAME: [name]    MR#:  [id number]  DATE OF BIRTH:  1951/03/25  SUBJECTIVE:  Pt is out on the bedside commode She is deaf but reads lips very well Shoulder bothering her No seizures overnite  REVIEW OF SYSTEMS:   Review of Systems  Constitutional: Negative for chills, fever and weight loss.  HENT: Negative for ear discharge, ear pain and nosebleeds.   Eyes: Negative for blurred vision, pain and discharge.  Respiratory: Negative for sputum production, shortness of breath, wheezing and stridor.   Cardiovascular: Negative for chest pain, palpitations, orthopnea and PND.  Gastrointestinal: Negative for abdominal pain, diarrhea, nausea and vomiting.  Genitourinary: Negative for frequency and urgency.  Musculoskeletal: Positive for joint pain. Negative for back pain.  Neurological: Negative for sensory change, speech change, focal weakness and weakness.  Psychiatric/Behavioral: Negative for depression and hallucinations. The patient is not nervous/anxious.    Tolerating Diet:yes Tolerating PT: HHPT  DRUG ALLERGIES:   Allergies  Allergen Reactions  . Aspirin Itching  . Celebrex [Celecoxib] Itching    itching  . Ciprofloxacin Itching  . Codeine Itching  . Fosphenytoin Itching  . Levaquin [Levofloxacin In D5w] Itching  . Levofloxacin Itching  . Lovastatin Itching  . Penicillins     Documentation indicates severe reaction  Pt tolerated cephalosporin without adverse reaction 09/18   . Pravastatin Itching  . Sulfa Antibiotics Itching    VITALS:  Blood pressure (!) 162/61, pulse (!) 57, temperature 97.7 F (36.5 C), temperature source Oral, resp. rate 18, height 5' 3" (1.6 m), weight 64 kg, SpO2 95 %.  PHYSICAL EXAMINATION:   Physical Exam  GENERAL:  69 y.o.-year-old patient lying in the bed with no acute distress.  EYES: Pupils equal, round, reactive to light and accommodation. No scleral icterus.    HEENT: Head atraumatic, normocephalic. Oropharynx and nasopharynx clear.  NECK:  Supple, no jugular venous distention. No thyroid enlargement, no tenderness.  LUNGS: Normal breath sounds bilaterally, no wheezing, rales, rhonchi. No use of accessory muscles of respiration.  CARDIOVASCULAR: S1, S2 normal. No murmurs, rubs, or gallops.  ABDOMEN: Soft, nontender, nondistended. Bowel sounds present. No organomegaly or mass.  EXTREMITIES: No cyanosis, clubbing or edema b/l.   Right shoulder sling NEUROLOGIC: Cranial nerves II through XII are intact. No focal Motor or sensory deficits b/l.   PSYCHIATRIC:  patient is alert and oriented x 3.  SKIN: No obvious rash, lesion, or ulcer.   LABORATORY PANEL:  CBC Recent Labs  Lab 12/21/19 0738  HGB 12.2  HCT 36.0    Chemistries  Recent Labs  Lab 12/23/19 0914  NA 139  K 3.2*  CL 101  CO2 29  GLUCOSE 164*  BUN 12  CREATININE 0.55  CALCIUM 8.4*  MG 1.4*   Cardiac Enzymes No results for input(s): TROPONINI in the last 168 hours. RADIOLOGY:  No results found. ASSESSMENT AND PLAN:    [name]  is a 69 y.o. female with a known history of known chronic seizure disorder, deafness, Gerd, depression, hypertension, non-alcoholic cirrhosis, COPD admitted on the orthopedic floor for reversal of right shoulder arthroplasty which was performed on 616 2021  Patient is on Vimpat for her known history of seizures. She had three seizures in 24 hours according to her nurse. Last seizure was around 4 PM.  1. Breakthrough seizure in a patient with a known history of chronic seizures -continue seizure precaution -IV PRN Ativan -Vimpat increased to  100 mg bid -d/w dr Irish Elders -pt to follow with Dr Manuella Ghazi as out pt  2. Hypertension continue home meds  3. History of COPD continue oxygen chronic -continue bronchodilators  4. Known history of cirrhosis, nonalcoholic  5. History of CVA -resume Plavix if okay with ortho and statin  6.  Jerrye Bushy continue PPI  7. Low K and mag--pharmacy to replace it     TOTAL TIME TAKING CARE OF THIS PATIENT: *30* minutes.  >50% time spent on counselling and coordination of care  Note: This dictation was prepared with Dragon dictation along with smaller phrase technology. Any transcriptional errors that result from this process are unintentional.  Fritzi Mandes M.D    [facility]   CC: Primary care physician; [facility] HealthPatient ID: [name], female   DOB: 12-09-1950, 69 y.o.   MRN: [id number]

## 2019-12-24 LAB — BASIC METABOLIC PANEL
Anion gap: 9 (ref 5–15)
BUN: 10 mg/dL (ref 8–23)
CO2: 28 mmol/L (ref 22–32)
Calcium: 8.4 mg/dL — ABNORMAL LOW (ref 8.9–10.3)
Chloride: 103 mmol/L (ref 98–111)
Creatinine, Ser: 0.45 mg/dL (ref 0.44–1.00)
GFR calc Af Amer: >60 mL/min (ref >60)
GFR calc non Af Amer: >60 mL/min (ref >60)
Glucose, Bld: 138 mg/dL — ABNORMAL HIGH (ref 70–99)
Potassium: 3.4 mmol/L — ABNORMAL LOW (ref 3.5–5.1)
Sodium: 140 mmol/L (ref 135–145)

## 2019-12-24 LAB — ALBUMIN: Albumin: 3 g/dL — ABNORMAL LOW (ref 3.5–5.0)

## 2019-12-24 LAB — MAGNESIUM: Magnesium: 2 mg/dL (ref 1.7–2.4)

## 2019-12-24 MED ORDER — LACOSAMIDE 100 MG PO TABS: 100.0000 mg | ORAL_TABLET | Freq: Two times a day (BID) | ORAL | 0 refills | Status: DC

## 2019-12-24 MED ORDER — POTASSIUM CHLORIDE CRYS ER 20 MEQ PO TBCR
40.0000 meq | EXTENDED_RELEASE_TABLET | Freq: Once | ORAL | Status: AC
  Filled 2019-12-24: qty 2

## 2019-12-24 MED ORDER — POTASSIUM CHLORIDE CRYS ER 20 MEQ PO TBCR
40.0000 meq | EXTENDED_RELEASE_TABLET | Freq: Once | ORAL | Status: AC
  Administered 2019-12-24: 40 meq via ORAL

## 2019-12-24 NOTE — Progress Notes (Signed)
Pt has been discharged home.  Discharge papers given and explained to pt and pt's daughter in law, Tildon Husky.  Daughter in law verbalized understanding.  Meds and f/u appointments reviewed.  Rx sent electronically to pharmacy.

## 2019-12-24 NOTE — Progress Notes (Signed)
Suspected seizure activity yesterday.  Seems at baseline today  Past Medical History:  Diagnosis Date  . Asthma   . CHF (congestive heart failure) ([place])   . Cirrhosis, non-alcoholic ([place])   . COPD (chronic obstructive pulmonary disease) ([place])   . Deaf   . Depression   . GERD (gastroesophageal reflux disease)   . Heart murmur   . Hepatitis   . Hypertension   . Lymph node disorder    arm  . Neuropathy   . On home oxygen therapy    hs  . Orthopnea   . RLS (restless legs syndrome)   . Seizures ([place])   . Shortness of breath dyspnea   . Sleep apnea   . Stroke [facility])    tia    Past Surgical History:  Procedure Laterality Date  . CATARACT EXTRACTION W/PHACO Right 11/23/2014   Procedure: CATARACT EXTRACTION PHACO AND INTRAOCULAR LENS PLACEMENT (IOC);  Surgeon: Lyla Glassing, MD;  Location: [facility] ORS;  Service: Ophthalmology;  Laterality: Right;  . CATARACT EXTRACTION W/PHACO Left 12/14/2014   Procedure: CATARACT EXTRACTION PHACO AND INTRAOCULAR LENS PLACEMENT (IOC);  Surgeon: Lyla Glassing, MD;  Location: [facility] ORS;  Service: Ophthalmology;  Laterality: Left;  US:01:16.6 AP:15.8 CDE:12.14  . CESAREAN SECTION    . CHOLECYSTECTOMY    . REVERSE SHOULDER ARTHROPLASTY Right 12/21/2019   Procedure: REVERSE SHOULDER ARTHROPLASTY;  Surgeon: Lovell Sheehan, MD;  Location: [facility] ORS;  Service: Orthopedics;  Laterality: Right;  . THUMB ARTHROSCOPY    . TONSILLECTOMY    . TYMPANOPLASTY     muliple    Family History  Problem Relation Age of Onset  . Lung cancer Mother   . CAD Father     Social History:  reports that she has been smoking cigarettes. She started smoking about 51 years ago. She has a 25.00 pack-year smoking history. She has never used smokeless tobacco. She reports that she does not drink alcohol and does not use drugs.  Allergies  Allergen Reactions  . Aspirin Itching  . Celebrex [Celecoxib] Itching    itching  . Ciprofloxacin Itching  . Codeine Itching  .  Fosphenytoin Itching  . Levaquin [Levofloxacin In D5w] Itching  . Levofloxacin Itching  . Lovastatin Itching  . Penicillins     Documentation indicates severe reaction  Pt tolerated cephalosporin without adverse reaction 09/18   . Pravastatin Itching  . Sulfa Antibiotics Itching    Medications: I have reviewed the patient's current medications.    Physical Examination: Blood pressure (!) 145/49, pulse 70, temperature 99.3 F (37.4 C), resp. rate 18, height _0  (1.6 m), weight 64 kg, SpO2 92 %.    Neurological Examination   Mental Status: Alert to name Cranial Nerves: II: Discs flat bilaterally; Visual fields grossly normal, pupils equal, round, reactive to light and accommodation V,VII: smile symmetric, facial light touch sensation normal bilaterally XI: bilateral shoulder shrug with R side pain  XII: midline tongue extension Motor: Generalized weakness more so RUE due to pain  Sensory: Pinprick and light touch intact throughout, bilaterally Deep Tendon Reflexes: 1+    Laboratory Studies:   Basic Metabolic Panel: Recent Labs  Lab 12/21/19 0738 12/22/19 1232 12/23/19 0914 12/24/19 0514  NA 140  --  139 140  K 3.3*  --  3.2* 3.4*  CL 105  --  101 103  CO2  --   --  29 28  GLUCOSE 143*  --  164* 138*  BUN 29*  --  12 10  CREATININE 2.20*  --  0.55 0.45  CALCIUM  --   --  8.4* 8.4*  MG  --  1.4* 1.4* 2.0    Liver Function Tests: Recent Labs  Lab 12/24/19 0514  ALBUMIN 3.0*   No results for input(s): LIPASE, AMYLASE in the last 168 hours. No results for input(s): AMMONIA in the last 168 hours.  CBC: Recent Labs  Lab 12/21/19 0738  HGB 12.2  HCT 36.0    Cardiac Enzymes: No results for input(s): CKTOTAL, CKMB, CKMBINDEX, TROPONINI in the last 168 hours.  BNP: Invalid input(s): POCBNP  CBG: Recent Labs  Lab 12/21/19 1104 12/21/19 2112  GLUCAP 210* 23*    Microbiology: Results for orders placed or performed during the hospital  encounter of 12/21/19  Surgical pcr screen     Status: Abnormal   Collection Time: 12/21/19  7:54 AM   Specimen: Nasal Mucosa; Nasal Swab  Result Value Ref Range Status   MRSA, PCR NEGATIVE NEGATIVE Final   Staphylococcus aureus POSITIVE (A) NEGATIVE Final    Comment: (NOTE) The Xpert SA Assay (FDA approved for NASAL specimens in patients 22 years of age and older), is one component of a comprehensive surveillance program. It is not intended to diagnose infection nor to guide or monitor treatment. Performed at [facility], [address] [place], [place] [address]     Coagulation Studies: No results for input(s): LABPROT, INR in the last 72 hours.  Urinalysis: No results for input(s): COLORURINE, LABSPEC, PHURINE, GLUCOSEU, HGBUR, BILIRUBINUR, KETONESUR, PROTEINUR, UROBILINOGEN, NITRITE, LEUKOCYTESUR in the last 168 hours.  Invalid input(s): APPERANCEUR  Lipid Panel:     Component Value Date/Time   TRIG 133 04/29/2017 1523    HgbA1C:  Lab Results  Component Value Date   HGBA1C 7.9 (H) 08/05/2019    Urine Drug Screen:      Component Value Date/Time   LABOPIA NONE DETECTED 04/29/2017 1312   COCAINSCRNUR NONE DETECTED 04/29/2017 1312   LABBENZ NONE DETECTED 04/29/2017 1312   AMPHETMU NONE DETECTED 04/29/2017 1312   THCU NONE DETECTED 04/29/2017 1312   LABBARB NONE DETECTED 04/29/2017 1312    Alcohol Level: No results for input(s): ETH in the last 168 hours.  Other results: EKG: normal EKG, normal sinus rhythm, unchanged from previous tracings.  Imaging: No results found.   Assessment/Plan:  69 y.o. female well known to Neurology service with hx of seizures on Vimpat 50 BID, prior falls, shoulder pain, COPD admitted with seizure episode.  Now back to baseline.    - No need for further imaging given the known hx of seizures - Increase Vimpat to 100 BID  - No seizures overnight reported  - follow up with Dr. Manuella Ghazi as out patient 12/24/2019, 11:26 AM

## 2019-12-24 NOTE — Progress Notes (Addendum)
  Subjective:  POD #3 s/p right reverse total shoulder arthroplasty.   Patient is deaf but indicates she is still having right shoulder pain but feels ready to go home today.  I am familiar with the patient as I have seen her in the past.  Patient is able to read lips and communicate.  Objective:   VITALS:   Vitals:   12/24/19 0143 12/24/19 0624 12/24/19 0736 12/24/19 0740  BP: (!) 116/47 (!) 175/53 (!) 145/49   Pulse: (!) 55 62 70   Resp: 20  18   Temp: 98.4 F (36.9 C)  99.3 F (37.4 C)   TempSrc: Oral     SpO2: 96% 96% 93% 92%  Weight:      Height:        PHYSICAL EXAM: Right shoulder/upper extremity: Neurovascular intact Sensation intact distally Intact right radial pulse Patient can flex and extend all 5 digits of the right hand dressing C/D/I No cellulitis present Arm and forearm compartments soft  LABS  Results for orders placed or performed during the hospital encounter of 12/21/19 (from the past 24 hour(s))  Magnesium     Status: None   Collection Time: 12/24/19  5:14 AM  Result Value Ref Range   Magnesium 2.0 1.7 - 2.4 mg/dL  Basic metabolic panel     Status: Abnormal   Collection Time: 12/24/19  5:14 AM  Result Value Ref Range   Sodium 140 135 - 145 mmol/L   Potassium 3.4 (L) 3.5 - 5.1 mmol/L   Chloride 103 98 - 111 mmol/L   CO2 28 22 - 32 mmol/L   Glucose, Bld 138 (H) 70 - 99 mg/dL   BUN 10 8 - 23 mg/dL   Creatinine, Ser 0.45 0.44 - 1.00 mg/dL   Calcium 8.4 (L) 8.9 - 10.3 mg/dL   GFR calc non Af Amer >60 >60 mL/min   GFR calc Af Amer >60 >60 mL/min   Anion gap 9 5 - 15  Albumin     Status: Abnormal   Collection Time: 12/24/19  5:14 AM  Result Value Ref Range   Albumin 3.0 (L) 3.5 - 5.0 g/dL    No results found.  Assessment/Plan: 3 Days Post-Op   Active Problems:   Seizure ([place])   S/P reverse total shoulder arthroplasty, right  Patient is currently stable.  She still has moderate right shoulder pain but feels able to go home today.  She  will continue using her sling until follow-up in the office on 01/03/20 @ 1:45 PM.  At the patient's request I spoke to her son's fianc, Glenard Haring, to inform her of the plan for discharge.  She is going to come to pick the patient up this morning.   Thornton Park , MD 12/24/2019, 11:01 AM

## 2019-12-24 NOTE — Progress Notes (Signed)
[place] [facility] [place] NAME: [name]    MR#:  [id number]  DATE OF BIRTH:  February 07, 1951  SUBJECTIVE:  She is deaf but reads lips very well Shoulder bothering her No seizures overnite reported  REVIEW OF SYSTEMS:   Review of Systems  Constitutional: Negative for chills, fever and weight loss.  HENT: Negative for ear discharge, ear pain and nosebleeds.   Eyes: Negative for blurred vision, pain and discharge.  Respiratory: Negative for sputum production, shortness of breath, wheezing and stridor.   Cardiovascular: Negative for chest pain, palpitations, orthopnea and PND.  Gastrointestinal: Negative for abdominal pain, diarrhea, nausea and vomiting.  Genitourinary: Negative for frequency and urgency.  Musculoskeletal: Positive for joint pain. Negative for back pain.  Neurological: Negative for sensory change, speech change, focal weakness and weakness.  Psychiatric/Behavioral: Negative for depression and hallucinations. The patient is not nervous/anxious.    Tolerating Diet:yes Tolerating PT: HHPT  DRUG ALLERGIES:   Allergies  Allergen Reactions  . Aspirin Itching  . Celebrex [Celecoxib] Itching    itching  . Ciprofloxacin Itching  . Codeine Itching  . Fosphenytoin Itching  . Levaquin [Levofloxacin In D5w] Itching  . Levofloxacin Itching  . Lovastatin Itching  . Penicillins     Documentation indicates severe reaction  Pt tolerated cephalosporin without adverse reaction 09/18   . Pravastatin Itching  . Sulfa Antibiotics Itching    VITALS:  Blood pressure (!) 145/49, pulse 70, temperature 99.3 F (37.4 C), resp. rate 18, height _0  (1.6 m), weight 64 kg, SpO2 92 %.  PHYSICAL EXAMINATION:   Physical Exam  GENERAL:  69 y.o.-year-old patient lying in the bed with no acute distress.  EYES: Pupils equal, round, reactive to light and accommodation. No scleral icterus.   HEENT: Head atraumatic, normocephalic. Oropharynx and  nasopharynx clear.  NECK:  Supple, no jugular venous distention. No thyroid enlargement, no tenderness.  LUNGS: Normal breath sounds bilaterally, no wheezing, rales, rhonchi. No use of accessory muscles of respiration.  CARDIOVASCULAR: S1, S2 normal. No murmurs, rubs, or gallops.  ABDOMEN: Soft, nontender, nondistended. Bowel sounds present. No organomegaly or mass.  EXTREMITIES: No cyanosis, clubbing or edema b/l.   Right shoulder sling NEUROLOGIC: Cranial nerves II through XII are intact. No focal Motor or sensory deficits b/l.   PSYCHIATRIC:  patient is alert and oriented x 3.  SKIN: No obvious rash, lesion, or ulcer.   LABORATORY PANEL:  CBC Recent Labs  Lab 12/21/19 0738  HGB 12.2  HCT 36.0    Chemistries  Recent Labs  Lab 12/24/19 0514  NA 140  K 3.4*  CL 103  CO2 28  GLUCOSE 138*  BUN 10  CREATININE 0.45  CALCIUM 8.4*  MG 2.0   Cardiac Enzymes No results for input(s): TROPONINI in the last 168 hours. RADIOLOGY:  No results found. ASSESSMENT AND PLAN:    [name]  is a 69 y.o. female with a known history of known chronic seizure disorder, deafness, Gerd, depression, hypertension, non-alcoholic cirrhosis, COPD admitted on the orthopedic floor for reversal of right shoulder arthroplasty which was performed on 616 2021  Patient is on Vimpat for her known history of seizures. She had three seizures in 24 hours according to her nurse. Last seizure was around 4 PM.  1. Breakthrough seizure in a patient with a known history of chronic seizures -continue seizure precaution -IV PRN Ativan -Vimpat increased to 100 mg bid -d/w dr Irish Elders -pt to follow with  Dr Manuella Ghazi as out pt  2. Hypertension continue home meds  3. History of COPD continue oxygen chronic -continue bronchodilators  4. Known history of cirrhosis, nonalcoholic  5. History of CVA -resume Plavix if okay with ortho and statin  6. Jerrye Bushy continue PPI  7. Low K and mag--repleted  8. DM-2  currently not on po meds Pt reports she was taken off meds as outpt. I have advised her to f/u with her PCP regarding her sugars.  Ok to go home from medical standpoint on current po meds. Will sign off.   Left message for Angel (DIL) to call me back    TOTAL TIME TAKING CARE OF THIS PATIENT: *30* minutes.  >50% time spent on counselling and coordination of care  Note: This dictation was prepared with Dragon dictation along with smaller phrase technology. Any transcriptional errors that result from this process are unintentional.  Fritzi Mandes M.D    [facility]   CC: Primary care physician; [facility] ID: [name], female   DOB: 1951-06-05, 69 y.o.   MRN: [id number]

## 2019-12-24 NOTE — Progress Notes (Addendum)
Pharmacy Electrolyte Monitoring Consult:  Pharmacy consulted to assist in monitoring and replacing electrolytes in this 69 y.o. female admitted on 12/21/2019 with No chief complaint on file.   Labs:  Sodium (mmol/L)  Date Value  12/24/2019 140  11/05/2013 139   Potassium (mmol/L)  Date Value  12/24/2019 3.4 (L)  11/05/2013 3.3 (L)   Magnesium (mg/dL)  Date Value  12/24/2019 2.0   Phosphorus (mg/dL)  Date Value  08/05/2019 3.8   Calcium (mg/dL)  Date Value  12/24/2019 8.4 (L)   Calcium, Total (mg/dL)  Date Value  11/05/2013 8.9   Albumin (g/dL)  Date Value  12/24/2019 3.0 (L)  11/05/2013 3.4    Assessment:   Plan: Pt is on KCl 20 mEq daily. KCl 40 mEq x 1 given.  Recheck K+ with AM labs.   Pharmacy will continue to follow.   Oswald Hillock 12/24/2019 8:52 AM

## 2020-05-02 ENCOUNTER — Other Ambulatory Visit: Payer: Self-pay | Admitting: Family Medicine

## 2020-05-02 DIAGNOSIS — Z13820 Encounter for screening for osteoporosis: Secondary | ICD-10-CM

## 2020-05-02 DIAGNOSIS — Z1231 Encounter for screening mammogram for malignant neoplasm of breast: Secondary | ICD-10-CM

## 2020-07-29 ENCOUNTER — Emergency Department: Payer: Medicare Other

## 2020-07-29 ENCOUNTER — Inpatient Hospital Stay: Payer: Medicare Other

## 2020-07-29 ENCOUNTER — Inpatient Hospital Stay
Admission: EM | Admit: 2020-07-29 | Discharge: 2020-08-01 | DRG: 177 | Disposition: A | Discharge status: Home Health | Payer: Medicare Other | Attending: Internal Medicine | Admitting: Internal Medicine

## 2020-07-29 DIAGNOSIS — K219 Gastro-esophageal reflux disease without esophagitis: Secondary | ICD-10-CM | POA: Diagnosis present

## 2020-07-29 DIAGNOSIS — Z7902 Long term (current) use of antithrombotics/antiplatelets: Secondary | ICD-10-CM | POA: Diagnosis not present

## 2020-07-29 DIAGNOSIS — Z720 Tobacco use: Secondary | ICD-10-CM | POA: Diagnosis present

## 2020-07-29 DIAGNOSIS — G40909 Epilepsy, unspecified, not intractable, without status epilepticus: Secondary | ICD-10-CM | POA: Diagnosis present

## 2020-07-29 DIAGNOSIS — R1319 Other dysphagia: Secondary | ICD-10-CM | POA: Diagnosis not present

## 2020-07-29 DIAGNOSIS — Z881 Allergy status to other antibiotic agents status: Secondary | ICD-10-CM | POA: Diagnosis not present

## 2020-07-29 DIAGNOSIS — Z882 Allergy status to sulfonamides status: Secondary | ICD-10-CM | POA: Diagnosis not present

## 2020-07-29 DIAGNOSIS — T502X5A Adverse effect of carbonic-anhydrase inhibitors, benzothiadiazides and other diuretics, initial encounter: Secondary | ICD-10-CM | POA: Diagnosis present

## 2020-07-29 DIAGNOSIS — I110 Hypertensive heart disease with heart failure: Secondary | ICD-10-CM | POA: Diagnosis present

## 2020-07-29 DIAGNOSIS — Z20822 Contact with and (suspected) exposure to covid-19: Secondary | ICD-10-CM | POA: Diagnosis present

## 2020-07-29 DIAGNOSIS — F17210 Nicotine dependence, cigarettes, uncomplicated: Secondary | ICD-10-CM | POA: Diagnosis present

## 2020-07-29 DIAGNOSIS — F32A Depression, unspecified: Secondary | ICD-10-CM | POA: Diagnosis present

## 2020-07-29 DIAGNOSIS — I5032 Chronic diastolic (congestive) heart failure: Secondary | ICD-10-CM | POA: Diagnosis present

## 2020-07-29 DIAGNOSIS — J440 Chronic obstructive pulmonary disease with acute lower respiratory infection: Secondary | ICD-10-CM | POA: Diagnosis present

## 2020-07-29 DIAGNOSIS — J441 Chronic obstructive pulmonary disease with (acute) exacerbation: Secondary | ICD-10-CM | POA: Diagnosis not present

## 2020-07-29 DIAGNOSIS — J189 Pneumonia, unspecified organism: Secondary | ICD-10-CM

## 2020-07-29 DIAGNOSIS — I10 Essential (primary) hypertension: Secondary | ICD-10-CM | POA: Diagnosis present

## 2020-07-29 DIAGNOSIS — J9621 Acute and chronic respiratory failure with hypoxia: Secondary | ICD-10-CM | POA: Diagnosis present

## 2020-07-29 DIAGNOSIS — J690 Pneumonitis due to inhalation of food and vomit: Secondary | ICD-10-CM | POA: Diagnosis present

## 2020-07-29 DIAGNOSIS — J9601 Acute respiratory failure with hypoxia: Secondary | ICD-10-CM

## 2020-07-29 DIAGNOSIS — Z7951 Long term (current) use of inhaled steroids: Secondary | ICD-10-CM

## 2020-07-29 DIAGNOSIS — Z79899 Other long term (current) drug therapy: Secondary | ICD-10-CM | POA: Diagnosis not present

## 2020-07-29 DIAGNOSIS — Z8673 Personal history of transient ischemic attack (TIA), and cerebral infarction without residual deficits: Secondary | ICD-10-CM

## 2020-07-29 DIAGNOSIS — G2581 Restless legs syndrome: Secondary | ICD-10-CM | POA: Diagnosis present

## 2020-07-29 DIAGNOSIS — Z9981 Dependence on supplemental oxygen: Secondary | ICD-10-CM | POA: Diagnosis not present

## 2020-07-29 DIAGNOSIS — Z96611 Presence of right artificial shoulder joint: Secondary | ICD-10-CM | POA: Diagnosis present

## 2020-07-29 DIAGNOSIS — R1313 Dysphagia, pharyngeal phase: Secondary | ICD-10-CM | POA: Diagnosis present

## 2020-07-29 DIAGNOSIS — E876 Hypokalemia: Secondary | ICD-10-CM | POA: Diagnosis present

## 2020-07-29 DIAGNOSIS — G40919 Epilepsy, unspecified, intractable, without status epilepticus: Secondary | ICD-10-CM

## 2020-07-29 DIAGNOSIS — M25511 Pain in right shoulder: Secondary | ICD-10-CM | POA: Diagnosis present

## 2020-07-29 DIAGNOSIS — R569 Unspecified convulsions: Secondary | ICD-10-CM

## 2020-07-29 DIAGNOSIS — J449 Chronic obstructive pulmonary disease, unspecified: Secondary | ICD-10-CM | POA: Diagnosis present

## 2020-07-29 DIAGNOSIS — K7460 Unspecified cirrhosis of liver: Secondary | ICD-10-CM | POA: Diagnosis present

## 2020-07-29 LAB — URINALYSIS, COMPLETE (UACMP) WITH MICROSCOPIC
Bilirubin Urine: NEGATIVE
Glucose, UA: NEGATIVE mg/dL
Hgb urine dipstick: NEGATIVE
Ketones, ur: NEGATIVE mg/dL
Leukocytes,Ua: NEGATIVE
Nitrite: NEGATIVE
Protein, ur: NEGATIVE mg/dL
Specific Gravity, Urine: 1.018 (ref 1.005–1.030)
Squamous Epithelial / HPF: NONE SEEN (ref 0–5)
WBC, UA: NONE SEEN WBC/hpf (ref 0–5)
pH: 5 (ref 5.0–8.0)

## 2020-07-29 LAB — COMPREHENSIVE METABOLIC PANEL
ALT: 16 U/L (ref 0–44)
AST: 27 U/L (ref 15–41)
Albumin: 3.6 g/dL (ref 3.5–5.0)
Alkaline Phosphatase: 108 U/L (ref 38–126)
Anion gap: 11 (ref 5–15)
BUN: 10 mg/dL (ref 8–23)
CO2: 28 mmol/L (ref 22–32)
Calcium: 8.7 mg/dL — ABNORMAL LOW (ref 8.9–10.3)
Chloride: 102 mmol/L (ref 98–111)
Creatinine, Ser: 0.52 mg/dL (ref 0.44–1.00)
GFR, Estimated: >60 mL/min (ref >60)
Glucose, Bld: 203 mg/dL — ABNORMAL HIGH (ref 70–99)
Potassium: 3 mmol/L — ABNORMAL LOW (ref 3.5–5.1)
Sodium: 141 mmol/L (ref 135–145)
Total Bilirubin: 0.6 mg/dL (ref 0.3–1.2)
Total Protein: 7 g/dL (ref 6.5–8.1)

## 2020-07-29 LAB — CBC WITH DIFFERENTIAL/PLATELET
Abs Immature Granulocytes: 0.02 10*3/uL (ref 0.00–0.07)
Basophils Absolute: 0 10*3/uL (ref 0.0–0.1)
Basophils Relative: 1 %
Eosinophils Absolute: 0.1 10*3/uL (ref 0.0–0.5)
Eosinophils Relative: 1 %
HCT: 43.7 % (ref 36.0–46.0)
Hemoglobin: 14.4 g/dL (ref 12.0–15.0)
Immature Granulocytes: 0 %
Lymphocytes Relative: 30 %
Lymphs Abs: 2.3 10*3/uL (ref 0.7–4.0)
MCH: 27.2 pg (ref 26.0–34.0)
MCHC: 33 g/dL (ref 30.0–36.0)
MCV: 82.6 fL (ref 80.0–100.0)
Monocytes Absolute: 0.5 10*3/uL (ref 0.1–1.0)
Monocytes Relative: 6 %
Neutro Abs: 4.7 10*3/uL (ref 1.7–7.7)
Neutrophils Relative %: 62 %
Platelets: 213 10*3/uL (ref 150–400)
RBC: 5.29 MIL/uL — ABNORMAL HIGH (ref 3.87–5.11)
RDW: 12.9 % (ref 11.5–15.5)
WBC: 7.6 10*3/uL (ref 4.0–10.5)
nRBC: 0 % (ref 0.0–0.2)

## 2020-07-29 LAB — TROPONIN I (HIGH SENSITIVITY)
Troponin I (High Sensitivity): 10 ng/L (ref <18)
Troponin I (High Sensitivity): 10 ng/L (ref <18)

## 2020-07-29 MED ORDER — SODIUM CHLORIDE 0.9 % IV SOLN
1.0000 g | Freq: Once | INTRAVENOUS | Status: AC
  Administered 2020-07-29: 1 g via INTRAVENOUS
  Filled 2020-07-29: qty 10

## 2020-07-29 MED ORDER — ROSUVASTATIN CALCIUM 10 MG PO TABS
10.0000 mg | ORAL_TABLET | Freq: Every evening | ORAL | Status: DC
  Administered 2020-07-29 – 2020-07-31 (×3): 10 mg via ORAL
  Filled 2020-07-29 (×4): qty 1

## 2020-07-29 MED ORDER — SODIUM CHLORIDE 0.9 % IV BOLUS: 1000.0000 mL | Freq: Once | INTRAVENOUS | Status: DC

## 2020-07-29 MED ORDER — IPRATROPIUM-ALBUTEROL 0.5-2.5 (3) MG/3ML IN SOLN
3.0000 mL | Freq: Four times a day (QID) | RESPIRATORY_TRACT | Status: AC
  Administered 2020-07-29 – 2020-07-30 (×3): 3 mL via RESPIRATORY_TRACT
  Filled 2020-07-29 (×3): qty 3

## 2020-07-29 MED ORDER — PANTOPRAZOLE SODIUM 40 MG PO TBEC
40.0000 mg | DELAYED_RELEASE_TABLET | Freq: Every day | ORAL | Status: DC
  Administered 2020-07-29: 40 mg via ORAL
  Filled 2020-07-29: qty 1

## 2020-07-29 MED ORDER — BUDESONIDE 0.25 MG/2ML IN SUSP
0.2500 mg | Freq: Two times a day (BID) | RESPIRATORY_TRACT | Status: DC
  Administered 2020-07-29 – 2020-07-30 (×2): 0.25 mg via RESPIRATORY_TRACT
  Filled 2020-07-29 (×2): qty 2

## 2020-07-29 MED ORDER — LACOSAMIDE 100 MG PO TABS: 100.0000 mg | ORAL_TABLET | Freq: Two times a day (BID) | ORAL | Status: DC

## 2020-07-29 MED ORDER — DOXYCYCLINE HYCLATE 100 MG PO TABS: 100.0000 mg | ORAL_TABLET | Freq: Two times a day (BID) | ORAL | 0 refills | Status: DC

## 2020-07-29 MED ORDER — LOSARTAN POTASSIUM 50 MG PO TABS
100.0000 mg | ORAL_TABLET | Freq: Every day | ORAL | Status: DC
Start: 2020-07-29 — End: 2020-08-01
  Administered 2020-07-29 – 2020-08-01 (×3): 100 mg via ORAL
  Filled 2020-07-29 (×3): qty 2

## 2020-07-29 MED ORDER — POTASSIUM CHLORIDE IN NACL 40-0.9 MEQ/L-% IV SOLN
INTRAVENOUS | Status: AC
  Filled 2020-07-29: qty 1000

## 2020-07-29 MED ORDER — ENOXAPARIN SODIUM 40 MG/0.4ML ~~LOC~~ SOLN
40.0000 mg | SUBCUTANEOUS | Status: DC
  Administered 2020-07-29 – 2020-07-31 (×3): 40 mg via SUBCUTANEOUS
  Filled 2020-07-29 (×3): qty 0.4

## 2020-07-29 MED ORDER — POTASSIUM CHLORIDE IN NACL 40-0.9 MEQ/L-% IV SOLN: INTRAVENOUS | Status: DC

## 2020-07-29 MED ORDER — MONTELUKAST SODIUM 10 MG PO TABS
10.0000 mg | ORAL_TABLET | Freq: Every day | ORAL | Status: DC
  Administered 2020-07-30 – 2020-08-01 (×2): 10 mg via ORAL
  Filled 2020-07-29 (×3): qty 1

## 2020-07-29 MED ORDER — SODIUM CHLORIDE 0.9 % IV SOLN
200.0000 mg | Freq: Once | INTRAVENOUS | Status: AC
  Administered 2020-07-29: 200 mg via INTRAVENOUS
  Filled 2020-07-29: qty 20

## 2020-07-29 MED ORDER — METRONIDAZOLE IN NACL 5-0.79 MG/ML-% IV SOLN
500.0000 mg | Freq: Three times a day (TID) | INTRAVENOUS | Status: AC
  Administered 2020-07-29 – 2020-07-31 (×6): 500 mg via INTRAVENOUS
  Filled 2020-07-29 (×8): qty 100

## 2020-07-29 MED ORDER — CITALOPRAM HYDROBROMIDE 10 MG PO TABS
10.0000 mg | ORAL_TABLET | Freq: Every day | ORAL | Status: DC
Start: 2020-07-29 — End: 2020-08-01
  Administered 2020-07-29 – 2020-08-01 (×3): 10 mg via ORAL
  Filled 2020-07-29 (×3): qty 1

## 2020-07-29 MED ORDER — POTASSIUM CHLORIDE CRYS ER 20 MEQ PO TBCR
40.0000 meq | EXTENDED_RELEASE_TABLET | Freq: Once | ORAL | Status: DC
  Filled 2020-07-29: qty 2

## 2020-07-29 MED ORDER — LACOSAMIDE 50 MG PO TABS
150.0000 mg | ORAL_TABLET | Freq: Two times a day (BID) | ORAL | Status: DC
  Administered 2020-07-29: 150 mg via ORAL
  Filled 2020-07-29: qty 3

## 2020-07-29 MED ORDER — SODIUM CHLORIDE 0.9 % IV SOLN
1.0000 g | INTRAVENOUS | Status: DC
  Administered 2020-07-30 – 2020-07-31 (×2): 1 g via INTRAVENOUS
  Filled 2020-07-29 (×2): qty 10
  Filled 2020-07-29: qty 1

## 2020-07-29 MED ORDER — FUROSEMIDE 40 MG PO TABS
40.0000 mg | ORAL_TABLET | Freq: Every day | ORAL | Status: DC
  Administered 2020-07-29: 40 mg via ORAL
  Filled 2020-07-29: qty 1

## 2020-07-29 MED ORDER — NICOTINE 14 MG/24HR TD PT24
14.0000 mg | MEDICATED_PATCH | Freq: Every day | TRANSDERMAL | Status: DC
  Filled 2020-07-29 (×2): qty 1

## 2020-07-29 MED ORDER — METOPROLOL SUCCINATE ER 50 MG PO TB24
25.0000 mg | ORAL_TABLET | Freq: Every day | ORAL | Status: DC
  Filled 2020-07-29: qty 1

## 2020-07-29 MED ORDER — LORAZEPAM 2 MG/ML IJ SOLN
1.0000 mg | Freq: Once | INTRAMUSCULAR | Status: AC
  Administered 2020-07-29: 1 mg via INTRAVENOUS
  Filled 2020-07-29: qty 1

## 2020-07-29 MED ORDER — CLOPIDOGREL BISULFATE 75 MG PO TABS
75.0000 mg | ORAL_TABLET | Freq: Every day | ORAL | Status: DC
  Administered 2020-07-29 – 2020-08-01 (×3): 75 mg via ORAL
  Filled 2020-07-29 (×3): qty 1

## 2020-07-29 MED ORDER — HYDROCODONE-ACETAMINOPHEN 5-325 MG PO TABS
1.0000 | ORAL_TABLET | ORAL | Status: DC | PRN
  Administered 2020-07-30: 1 via ORAL
  Filled 2020-07-29: qty 1

## 2020-07-29 MED ORDER — MOMETASONE FURO-FORMOTEROL FUM 100-5 MCG/ACT IN AERO: 2.0000 | INHALATION_SPRAY | Freq: Two times a day (BID) | RESPIRATORY_TRACT | Status: DC

## 2020-07-29 NOTE — ED Provider Notes (Signed)
[facility] Emergency Department Provider Note  ____________________________________________  Time seen: Approximately 1:37 PM  I have reviewed the triage vital signs and the nursing notes.   HISTORY  Chief Complaint Seizures  Level 5 caveat: Patient currently postictal and majority of history is provided by EMS or her medical record  HPI [name] is a 70 y.o. female who presents to the emergency department via EMS with complaint of seizures.  According to EMS, patient was celebrating her birthday which was last week with her family when she started to have seizure-like activity.  Patient has a history of seizures, takes Vimpat for same.  Reportedly patient had been well prior to this episode.  Unsure when last seizure-like activity was.  Patient was noted to have a deep cough and rales on physical exam by EMS.  According to EMS, this is the patient's baseline as reported by the patient's family.  Patient has had no reported fevers, chills, URI symptoms.  Patient was sitting down when she began to have seizure-like activity.  There was no subsequent trauma.  Patient with a history of seizures, asthma, CHF, nonalcoholic cirrhosis, COPD, GERD, hypertension, sleep apnea, CVA.   EMS reports that patient has had 4 seizures with the fourth being on arrival in the emergency department.  Patient patient will have witnessed seizure-like activity, then this would resolve prior to administration of medication.  Patient is postictal at this time.  She is deaf but can read lips or use ASL interpreter.  Patient is unable to focus on screen for ALS interpreter and does not appear to be comprehending discussion by reading lips.  Patient has been nonverbal which is her baseline.  At this time only response that we have received from the patient as when asking about pain she points to her chest.   After administration of Ativan patient has not had any more seizures.  After a period  of time, postictal state seem to have resolved and patient is able to use the ASL interpreter via video.  Patient reported some right sided chest discomfort with cough, nasal congestion and chills times a week.  Patient was also endorsing a significant headache with vision changes and left eye pain.  Patient denied any trauma prior to today.  She has not take any medications for her chest discomfort, cough or chills.        Past Medical History:  Diagnosis Date  . Asthma   . CHF (congestive heart failure) ([place])   . Cirrhosis, non-alcoholic ([place])   . COPD (chronic obstructive pulmonary disease) ([place])   . Deaf   . Depression   . GERD (gastroesophageal reflux disease)   . Heart murmur   . Hepatitis   . Hypertension   . Lymph node disorder    arm  . Neuropathy   . On home oxygen therapy    hs  . Orthopnea   . RLS (restless legs syndrome)   . Seizures ([place])   . Shortness of breath dyspnea   . Sleep apnea   . Stroke [facility])    tia    Patient Active Problem List   Diagnosis Date Noted  . S/P reverse total shoulder arthroplasty, right 12/21/2019  . Comminuted fracture of right humerus 08/05/2019  . History of CVA in adulthood 08/05/2019  . COPD (chronic obstructive pulmonary disease) ([place])   . Deaf   . On home oxygen therapy   . Cirrhosis, non-alcoholic ([place])   . Asthma   .  Fall   . Closed comminuted fracture of right humerus   . Hypomagnesemia   . PNA (pneumonia) 03/01/2018  . Bronchitis 09/23/2017  . Chronic diastolic heart failure ([place]) 08/26/2017  . Tobacco use 08/26/2017  . Seizure disorder ([place]) 06/15/2017  . Goals of care, counseling/discussion 05/06/2017  . Status epilepticus ([place])   . CAP (community acquired pneumonia)   . Hypoxia   . Hypokalemia 02/12/2017  . GERD (gastroesophageal reflux disease) 03/23/2016  . Depression 03/23/2016  . Diabetes mellitus without complication ([place]) 86/76/1949  . Essential hypertension 03/22/2016  . Neuropathy 03/22/2016  .  Seizure ([place]) 03/22/2016  . COPD exacerbation ([place]) 03/31/2015    Past Surgical History:  Procedure Laterality Date  . CATARACT EXTRACTION W/PHACO Right 11/23/2014   Procedure: CATARACT EXTRACTION PHACO AND INTRAOCULAR LENS PLACEMENT (IOC);  Surgeon: Lyla Glassing, MD;  Location: [facility] ORS;  Service: Ophthalmology;  Laterality: Right;  . CATARACT EXTRACTION W/PHACO Left 12/14/2014   Procedure: CATARACT EXTRACTION PHACO AND INTRAOCULAR LENS PLACEMENT (IOC);  Surgeon: Lyla Glassing, MD;  Location: [facility] ORS;  Service: Ophthalmology;  Laterality: Left;  US:01:16.6 AP:15.8 CDE:12.14  . CESAREAN SECTION    . CHOLECYSTECTOMY    . REVERSE SHOULDER ARTHROPLASTY Right 12/21/2019   Procedure: REVERSE SHOULDER ARTHROPLASTY;  Surgeon: Lovell Sheehan, MD;  Location: [facility] ORS;  Service: Orthopedics;  Laterality: Right;  . THUMB ARTHROSCOPY    . TONSILLECTOMY    . TYMPANOPLASTY     muliple    Prior to Admission medications   Medication Sig Start Date End Date Taking? Authorizing Provider  doxycycline (VIBRA-TABS) 100 MG tablet Take 1 tablet (100 mg total) by mouth 2 (two) times daily. 07/29/20  Yes Davinity Fanara, Charline Bills, PA-C  acetaminophen (TYLENOL) 325 MG tablet Take 2 tablets (650 mg total) by mouth every 6 (six) hours as needed for mild pain (or Fever >/= 101). Patient not taking: Reported on 12/07/2019 08/08/19   Thornell Mule, MD  acidophilus (RISAQUAD) CAPS capsule Take 1 capsule by mouth daily. Patient not taking: Reported on 12/09/2019 06/27/17   Gladstone Lighter, MD  albuterol (PROVENTIL HFA;VENTOLIN HFA) 108 (90 BASE) MCG/ACT inhaler Inhale 2 puffs into the lungs every 4 (four) hours as needed for wheezing or shortness of breath.    [provider]  albuterol (PROVENTIL) (2.5 MG/3ML) 0.083% nebulizer solution Take 3 mLs (2.5 mg total) by nebulization every 4 (four) hours as needed for wheezing or shortness of breath. 09/23/17   Alisa Graff, FNP  citalopram (CELEXA) 10 MG tablet Take  10 mg by mouth daily. 03/02/19   [provider]  clopidogrel (PLAVIX) 75 MG tablet Take 1 tablet (75 mg total) by mouth daily. 07/17/16   Vaughan Basta, MD  docusate sodium (COLACE) 100 MG capsule Take 1 capsule (100 mg total) by mouth 2 (two) times daily as needed for mild constipation. Patient not taking: Reported on 12/09/2019 03/26/19   Nicholes Mango, MD  furosemide (LASIX) 40 MG tablet Take 40 mg by mouth daily. 07/22/18   [provider]  HYDROcodone-acetaminophen (NORCO/VICODIN) 5-325 MG tablet Take 1 tablet by mouth every 4 (four) hours as needed for moderate pain (pain score 4-6). 12/22/19   Carlynn Spry, PA-C  Lacosamide 100 MG TABS Take 1 tablet (100 mg total) by mouth 2 (two) times daily. 12/24/19   Fritzi Mandes, MD  losartan (COZAAR) 50 MG tablet Take 50 mg by mouth daily.  03/02/19   [provider]  meclizine (ANTIVERT) 25 MG tablet Take 1 tablet (25 mg  total) by mouth 3 (three) times daily as needed for dizziness. Patient not taking: Reported on 12/07/2019 03/26/19   Nicholes Mango, MD  methocarbamol (ROBAXIN) 500 MG tablet Take 1 tablet (500 mg total) by mouth every 8 (eight) hours as needed for muscle spasms. Patient not taking: Reported on 12/07/2019 08/08/19   Thornell Mule, MD  metoprolol succinate (TOPROL-XL) 25 MG 24 hr tablet Take 1 tablet (25 mg total) by mouth daily. 08/08/19   Thornell Mule, MD  montelukast (SINGULAIR) 10 MG tablet Take 10 mg by mouth at bedtime.  03/18/16   [provider]  omeprazole (PRILOSEC) 20 MG capsule Take 20 mg by mouth daily.    [provider]  ondansetron (ZOFRAN ODT) 8 MG disintegrating tablet Take 1 tablet (8 mg total) by mouth every 8 (eight) hours as needed for nausea or vomiting. Patient not taking: Reported on 12/07/2019 08/13/19   Arta Silence, MD  OXYGEN Inhale 2 L into the lungs at bedtime.    [provider]  polyethylene glycol (MIRALAX / GLYCOLAX) 17 g packet Take 17 g by mouth daily  as needed. Patient not taking: Reported on 12/07/2019 08/08/19   Thornell Mule, MD  potassium chloride (KLOR-CON) 10 MEQ tablet Take 2 tablets (20 mEq total) by mouth daily. 08/08/19 12/09/19  Thornell Mule, MD  rosuvastatin (CRESTOR) 10 MG tablet Take 10 mg by mouth daily. 04/21/17   [provider]    Allergies Aspirin, Celebrex [celecoxib], Ciprofloxacin, Codeine, Fosphenytoin, Levaquin [levofloxacin in d5w], Levofloxacin, Lovastatin, Penicillins, Pravastatin, and Sulfa antibiotics  Family History  Problem Relation Age of Onset  . Lung cancer Mother   . CAD Father     Social History Social History   Tobacco Use  . Smoking status: Current Every Day Smoker    Packs/day: 0.50    Years: 50.00    Pack years: 25.00    Types: Cigarettes    Start date: 03/01/1968  . Smokeless tobacco: Never Used  Vaping Use  . Vaping Use: Former  Substance Use Topics  . Alcohol use: No  . Drug use: No     Review of Systems provided by EMS reported by patient's family Constitutional: No reported fever/chills Eyes: No visual changes. No discharge ENT: No upper respiratory complaints. Cardiovascular: Positive chest pain (patient points to the chest when asked if anything hurts). Respiratory: Positive for chronic cough. No reported SOB. Gastrointestinal:  No reported nausea, no reported vomiting. Genitourinary: No reported dysuria. No hematuria Musculoskeletal: Negative for musculoskeletal pain/injury. Skin: Negative for rash, abrasions, lacerations, ecchymosis. Neurological: Positive for witnessed seizure activity.  No reported headaches, focal weakness or numbness prior to onset of seizures.  10 System ROS otherwise negative.  ____________________________________________   PHYSICAL EXAM:  VITAL SIGNS: ED Triage Vitals  Enc Vitals Group     BP      Pulse      Resp      Temp      Temp src      SpO2      Weight      Height      Head Circumference      Peak Flow      Pain  Score      Pain Loc      Pain Edu?      Excl. in [place]?      Constitutional: Has had witnessed seizure activity, intermittently alert.  Does not appear oriented.  Patient is deaf and communicates through reading lips or ASL interpreter.  Well appearing and in no acute distress. Eyes: Conjunctivae are normal. PERRL. EOMI. Head: Atraumatic.  No visible signs of trauma with abrasions, lacerations, ecchymosis, hematoma.  Patient with no palpable abnormality and no crepitus with palpation.  No battle signs, raccoon eyes, serosanguineous fluid drainage from the ears or nares. ENT:      Ears:       Nose: No congestion/rhinnorhea.      Mouth/Throat: Mucous membranes are moist.  Neck: No stridor.  Neck is supple full range of motion Hematological/Lymphatic/Immunilogical: No cervical lymphadenopathy. Cardiovascular: Normal rate, regular rhythm. Normal S1 and S2.  Good peripheral circulation. Respiratory: Normal respiratory effort without tachypnea or retractions. Lungs with crackles and rales bilaterally.  Appears to be slightly worse in the left lower lung field than right.Kermit Balo air entry to the bases with no decreased or absent breath sounds. Gastrointestinal: Bowel sounds 4 quadrants. Soft and nontender to palpation. No guarding or rigidity. No palpable masses. No distention. No CVA tenderness. Musculoskeletal: Full range of motion to all extremities. No gross deformities appreciated. Neurologic:  Normal speech and language. No gross focal neurologic deficits are appreciated.  Skin:  Skin is warm, dry and intact. No rash noted. Psychiatric: Mood and affect are normal. Speech and behavior are normal. Patient exhibits appropriate insight and judgement.   ____________________________________________   LABS (all labs ordered are listed, but only abnormal results are displayed)  Labs Reviewed  COMPREHENSIVE METABOLIC PANEL - Abnormal; Notable for the following components:      Result Value    Potassium 3.0 (*)    Glucose, Bld 203 (*)    Calcium 8.7 (*)    All other components within normal limits  CBC WITH DIFFERENTIAL/PLATELET - Abnormal; Notable for the following components:   RBC 5.29 (*)    All other components within normal limits  SARS CORONAVIRUS 2 (TAT 6-24 HRS)  URINALYSIS, COMPLETE (UACMP) WITH MICROSCOPIC  CBG MONITORING, ED  TROPONIN I (HIGH SENSITIVITY)  TROPONIN I (HIGH SENSITIVITY)   ____________________________________________  EKG   ____________________________________________  RADIOLOGY I personally viewed and evaluated these images as part of my medical decision making, as well as reviewing the written report by the radiologist.  ED Provider Interpretation: Concur with patchy infiltrate in the left lower lung base.  DG Chest 1 View  Result Date: 07/29/2020 CLINICAL DATA:  Chest pain and seizure. EXAM: CHEST  1 VIEW COMPARISON:  August 05, 2019 FINDINGS: The heart size and mediastinal contours are within normal limits. Small patchy is identified in the left lung base. The right lung is clear. There is no pleural effusion or pulmonary edema. Right shoulder replacement is noted. IMPRESSION: Small patchy is identified in the left lung base, developing pneumonia or nodule is not excluded. Electronically Signed   By: Abelardo Diesel M.D.   On: 07/29/2020 14:17   CT Head Wo Contrast  Result Date: 07/29/2020 CLINICAL DATA:  Seizures today. EXAM: CT HEAD WITHOUT CONTRAST TECHNIQUE: Contiguous axial images were obtained from the base of the skull through the vertex without intravenous contrast. COMPARISON:  August 05, 2019 FINDINGS: Brain: No evidence of acute infarction, hemorrhage, hydrocephalus, extra-axial collection or mass lesion/mass effect. There is mild chronic diffuse atrophy. Vascular: No hyperdense vessel is noted. Skull: Normal. Negative for fracture or focal lesion. Sinuses/Orbits: No acute finding. Other: None. IMPRESSION: 1. No focal acute  intracranial abnormality identified. 2. Mild chronic diffuse atrophy. Electronically Signed   By: Abelardo Diesel M.D.   On: 07/29/2020 15:46    ____________________________________________  PROCEDURES  Procedure(s) performed:    Procedures    Medications  lacosamide (VIMPAT) 200 mg in sodium chloride 0.9 % 25 mL IVPB (has no administration in time range)  cefTRIAXone (ROCEPHIN) 1 g in sodium chloride 0.9 % 100 mL IVPB (has no administration in time range)  sodium chloride 0.9 % bolus 1,000 mL (has no administration in time range)  LORazepam (ATIVAN) injection 1 mg (1 mg Intravenous Given 07/29/20 1341)     ____________________________________________   INITIAL IMPRESSION / ASSESSMENT AND PLAN / ED COURSE  Pertinent labs & imaging results that were available during my care of the patient were reviewed by me and considered in my medical decision making (see chart for details).  Review of the [place] CSRS was performed in accordance of the [facility] prior to dispensing any controlled drugs.  Clinical Course as of 07/29/20 1644  Sun Jul 29, 2020  1632 Patient presented to the emergency department with a complaint of seizure from home.  Patient was celebrating her birthday with family when she was noted to have a seizure.  EMS reportedly witnessed 2 seizures in route but these resolved without medication.  Patient arrived to the ED and began seizure-like activity as she entered the department.  Patient was placed in a room and Ativan was administered with good success.  Patient did have some transient periods of hypoxia and was placed on 2 L of oxygen via nasal cannula.  Chest x-ray concerning for community-acquired pneumonia and remainder of labs are reassuring.  On reevaluation of the patient, oxygen therapy was stopped and patient immediately started to desaturate.  Patient also had a another witnessed seizure in the room which lasted approximately 15 seconds and resolved without medication.   At this time with subsequent seizures, ongoing hypoxia feel that patient will require admission. [JC]    Clinical Course User Index [JC] Branna Cortina, Charline Bills, PA-C          Patient's diagnosis is consistent with seizures, community acquired pneumonia.  Patient presented to the emergency department from home for seizures.  This was witnessed in the emergency department.  Patient's fourth seizure was treated with Ativan with good success.  Patient had been largely without symptoms here after administration of Ativan.  Patient did have postictal state and was initially unable to participate in questioning.  Eventually patient was cognitive enough to use the video ASL interpreter to communicate.  Work-up found findings concerning for community-acquired pneumonia.  This would be consistent with patient's physical exam.  I was assessing the patient for discharge when we took her off of the oxygen that she had been placed on for transient hypoxia during her seizure activity.  Patient desaturated to 88% within 30 seconds of ceasing oxygen and subsequently had another seizure.  At this time I will contact hospitalist service for admission of the patient.  Patient will be started on antibiotics.  She does have a documented penicillin allergy but is documented to tolerate cephalosporins without difficulty.  I will use Rocephin here.  I will give the patient a loading dose of Vimpat which the patient takes at home for her seizures.     ____________________________________________  FINAL CLINICAL IMPRESSION(S) / ED DIAGNOSES  Final diagnoses:  Seizure ([place])  Community acquired pneumonia of left lower lobe of lung  Acute respiratory failure with hypoxia ([place])      NEW MEDICATIONS STARTED DURING THIS VISIT:  ED Discharge Orders         Ordered    doxycycline (  VIBRA-TABS) 100 MG tablet  2 times daily        07/29/20 1623              This chart was dictated using voice recognition  software/Dragon. Despite best efforts to proofread, errors can occur which can change the meaning. Any change was purely unintentional.    Darletta Moll, PA-C 07/29/20 1644    Lavonia Drafts, MD 07/31/20 [phone]

## 2020-07-29 NOTE — H&P (Signed)
History and Physical    [name] ZOX:[id number] DOB: March 29, 1951 DOA: 07/29/2020  PCP: [facility]   Patient coming from: Home  I have personally briefly reviewed patient's old medical records in [place]  Chief Complaint: Seizures  Most of the history was obtained from patient's son over the phone and ASL interpreter was used as well  HPI: [name] is a 70 y.o. female with medical history significant for COPD on home oxygen 2 L as needed during the day and at night, nicotine dependence, seizure disorder, hypertension, chronic diastolic dysfunction CHF, nonalcoholic cirrhosis, deafness who presents to the ER by EMS for evaluation of multiple witnessed seizures at home. Per patient's was celebrating her birthday with family members today when she had a tonic-clonic seizure which was witnessed by family members she had another 1 and EMS was called.  EMS reports to have witnessed 2 more seizure-like activity which aborted without any intervention.  Upon arrival to the ER she had another seizure lasting about 2 minutes and received a dose of lorazepam 2 mg IV x1.  She had room air pulse oximetry of 80% and she was placed on 2 L of oxygen with improvement in her pulse oximetry.  Per EMS patient had a wet sounding cough and rales on physical exam.  Patient was postictal for about 40 minutes and was back to her baseline mental status per ER provider. Attempts were made to discharge patient from the ER and when they took her off oxygen her pulse oximetry dropped to 86%. She has a wet sounding cough but denies shortness of breath, no chest pain, no nausea, no vomiting, no abdominal pain, no fever, no chills, no headache, no blurry vision, no weakness. Her son states that she is compliant with prescribed medications. Labs show sodium 141, potassium 3, chloride 102, bicarb 28, glucose 203, BUN 10, creatinine 0.52, calcium 8.7, alkaline phosphatase 108, albumin  3.6, AST 27, ALT 16, white count 7.6, hemoglobin 14.4, hematocrit 43.7, RDW 12.9, platelet count 213 SARS coronavirus 2 PCR test is pending CT scan of the head without contrast shows no focal acute intracranial abnormality.  Mild chronic diffuse atrophy. Chest x-ray reviewed by me shows a small patchy infiltrate in the left lung base, developing pneumonia or nodule is not excluded. Twelve-lead EKG reviewed by me shows sinus rhythm with minimal ST depression in the lateral leads.   ED Course: Patient is a 70 year old Caucasian female who was brought into the ER for evaluation following multiple episodes of witnessed seizure, she was hypoxic in the field with room air pulse oximetry of 86% and has a loose wet cough.  Chest x-ray shows a left lower lobe infiltrate concerning for possible aspiration.  Patient will be admitted to the hospital for further evaluation.  Review of Systems: As per HPI otherwise all systems reviewed and negative.    Past Medical History:  Diagnosis Date  . Asthma   . CHF (congestive heart failure) ([place])   . Cirrhosis, non-alcoholic ([place])   . COPD (chronic obstructive pulmonary disease) ([place])   . Deaf   . Depression   . GERD (gastroesophageal reflux disease)   . Heart murmur   . Hepatitis   . Hypertension   . Lymph node disorder    arm  . Neuropathy   . On home oxygen therapy    hs  . Orthopnea   . RLS (restless legs syndrome)   . Seizures ([place])   . Shortness of  breath dyspnea   . Sleep apnea   . Stroke [facility])    tia    Past Surgical History:  Procedure Laterality Date  . CATARACT EXTRACTION W/PHACO Right 11/23/2014   Procedure: CATARACT EXTRACTION PHACO AND INTRAOCULAR LENS PLACEMENT (IOC);  Surgeon: Lyla Glassing, MD;  Location: [facility] ORS;  Service: Ophthalmology;  Laterality: Right;  . CATARACT EXTRACTION W/PHACO Left 12/14/2014   Procedure: CATARACT EXTRACTION PHACO AND INTRAOCULAR LENS PLACEMENT (IOC);  Surgeon: Lyla Glassing, MD;  Location: [facility]  ORS;  Service: Ophthalmology;  Laterality: Left;  US:01:16.6 AP:15.8 CDE:12.14  . CESAREAN SECTION    . CHOLECYSTECTOMY    . REVERSE SHOULDER ARTHROPLASTY Right 12/21/2019   Procedure: REVERSE SHOULDER ARTHROPLASTY;  Surgeon: Lovell Sheehan, MD;  Location: [facility] ORS;  Service: Orthopedics;  Laterality: Right;  . THUMB ARTHROSCOPY    . TONSILLECTOMY    . TYMPANOPLASTY     muliple     reports that she has been smoking cigarettes. She started smoking about 52 years ago. She has a 25.00 pack-year smoking history. She has never used smokeless tobacco. She reports that she does not drink alcohol and does not use drugs.  Allergies  Allergen Reactions  . Aspirin Itching  . Celebrex [Celecoxib] Itching    itching  . Ciprofloxacin Itching  . Codeine Itching  . Fosphenytoin Itching  . Levaquin [Levofloxacin In D5w] Itching  . Levofloxacin Itching  . Lovastatin Itching  . Penicillins     Documentation indicates severe reaction  Pt tolerated cephalosporin without adverse reaction 09/18   . Pravastatin Itching  . Sulfa Antibiotics Itching    Family History  Problem Relation Age of Onset  . Lung cancer Mother   . CAD Father      Prior to Admission medications   Medication Sig Start Date End Date Taking? Authorizing Provider  doxycycline (VIBRA-TABS) 100 MG tablet Take 1 tablet (100 mg total) by mouth 2 (two) times daily. 07/29/20  Yes Cuthriell, Charline Bills, PA-C  acetaminophen (TYLENOL) 325 MG tablet Take 2 tablets (650 mg total) by mouth every 6 (six) hours as needed for mild pain (or Fever >/= 101). Patient not taking: Reported on 12/07/2019 08/08/19   Thornell Mule, MD  acidophilus (RISAQUAD) CAPS capsule Take 1 capsule by mouth daily. Patient not taking: Reported on 12/09/2019 06/27/17   Gladstone Lighter, MD  albuterol (PROVENTIL HFA;VENTOLIN HFA) 108 (90 BASE) MCG/ACT inhaler Inhale 2 puffs into the lungs every 4 (four) hours as needed for wheezing or shortness of breath.     [provider]  albuterol (PROVENTIL) (2.5 MG/3ML) 0.083% nebulizer solution Take 3 mLs (2.5 mg total) by nebulization every 4 (four) hours as needed for wheezing or shortness of breath. 09/23/17   Alisa Graff, FNP  citalopram (CELEXA) 10 MG tablet Take 10 mg by mouth daily. 03/02/19   [provider]  clopidogrel (PLAVIX) 75 MG tablet Take 1 tablet (75 mg total) by mouth daily. 07/17/16   Vaughan Basta, MD  docusate sodium (COLACE) 100 MG capsule Take 1 capsule (100 mg total) by mouth 2 (two) times daily as needed for mild constipation. Patient not taking: Reported on 12/09/2019 03/26/19   Nicholes Mango, MD  furosemide (LASIX) 40 MG tablet Take 40 mg by mouth daily. 07/22/18   [provider]  HYDROcodone-acetaminophen (NORCO/VICODIN) 5-325 MG tablet Take 1 tablet by mouth every 4 (four) hours as needed for moderate pain (pain score 4-6). 12/22/19   Carlynn Spry, PA-C  Lacosamide 100 MG TABS Take  1 tablet (100 mg total) by mouth 2 (two) times daily. 12/24/19   Fritzi Mandes, MD  losartan (COZAAR) 50 MG tablet Take 50 mg by mouth daily.  03/02/19   [provider]  meclizine (ANTIVERT) 25 MG tablet Take 1 tablet (25 mg total) by mouth 3 (three) times daily as needed for dizziness. Patient not taking: Reported on 12/07/2019 03/26/19   Nicholes Mango, MD  methocarbamol (ROBAXIN) 500 MG tablet Take 1 tablet (500 mg total) by mouth every 8 (eight) hours as needed for muscle spasms. Patient not taking: Reported on 12/07/2019 08/08/19   Thornell Mule, MD  metoprolol succinate (TOPROL-XL) 25 MG 24 hr tablet Take 1 tablet (25 mg total) by mouth daily. 08/08/19   Thornell Mule, MD  montelukast (SINGULAIR) 10 MG tablet Take 10 mg by mouth at bedtime.  03/18/16   [provider]  omeprazole (PRILOSEC) 20 MG capsule Take 20 mg by mouth daily.    [provider]  ondansetron (ZOFRAN ODT) 8 MG disintegrating tablet Take 1 tablet (8 mg total) by mouth every 8 (eight)  hours as needed for nausea or vomiting. Patient not taking: Reported on 12/07/2019 08/13/19   Arta Silence, MD  OXYGEN Inhale 2 L into the lungs at bedtime.    [provider]  polyethylene glycol (MIRALAX / GLYCOLAX) 17 g packet Take 17 g by mouth daily as needed. Patient not taking: Reported on 12/07/2019 08/08/19   Thornell Mule, MD  potassium chloride (KLOR-CON) 10 MEQ tablet Take 2 tablets (20 mEq total) by mouth daily. 08/08/19 12/09/19  Thornell Mule, MD  rosuvastatin (CRESTOR) 10 MG tablet Take 10 mg by mouth daily. 04/21/17   [provider]    Physical Exam: Vitals:   07/29/20 1357 07/29/20 1500 07/29/20 1631 07/29/20 1631  BP:  (!) 151/67    Pulse:  64    Resp:  (!) 24    SpO2: 93% 94% (!) 88% 93%     Vitals:   07/29/20 1357 07/29/20 1500 07/29/20 1631 07/29/20 1631  BP:  (!) 151/67    Pulse:  64    Resp:  (!) 24    SpO2: 93% 94% (!) 88% 93%    Constitutional: NAD, alert and oriented x 3 Eyes: PERRL, lids and conjunctivae normal ENMT: Mucous membranes are moist.  Patient is deaf Neck: normal, supple, no masses, no thyromegaly Respiratory: Coarse breath sounds in all lung fields, diffuse rhonchi, faint wheezing, no crackles. Normal respiratory effort. No accessory muscle use.  Cardiovascular: Regular rate and rhythm, no murmurs / rubs / gallops. No extremity edema. 2+ pedal pulses. No carotid bruits.  Abdomen: no tenderness, no masses palpated. No hepatosplenomegaly. Bowel sounds positive.  Musculoskeletal: no clubbing / cyanosis. No joint deformity upper and lower extremities.  Skin: no rashes, lesions, ulcers.  Neurologic: No gross focal neurologic deficit. Psychiatric: Normal mood and affect.   Labs on Admission: I have personally reviewed following labs and imaging studies  CBC: Recent Labs  Lab 07/29/20 1345  WBC 7.6  NEUTROABS 4.7  HGB 14.4  HCT 43.7  MCV 82.6  PLT 062   Basic Metabolic Panel: Recent Labs  Lab 07/29/20 1345  NA  141  K 3.0*  CL 102  CO2 28  GLUCOSE 203*  BUN 10  CREATININE 0.52  CALCIUM 8.7*   GFR: CrCl cannot be calculated (Unknown ideal weight.). Liver Function Tests: Recent Labs  Lab 07/29/20 1345  AST 27  ALT 16  ALKPHOS 108  BILITOT 0.6  PROT 7.0  ALBUMIN 3.6   No results for input(s): LIPASE, AMYLASE in the last 168 hours. No results for input(s): AMMONIA in the last 168 hours. Coagulation Profile: No results for input(s): INR, PROTIME in the last 168 hours. Cardiac Enzymes: No results for input(s): CKTOTAL, CKMB, CKMBINDEX, TROPONINI in the last 168 hours. BNP (last 3 results) No results for input(s): PROBNP in the last 8760 hours. HbA1C: No results for input(s): HGBA1C in the last 72 hours. CBG: No results for input(s): GLUCAP in the last 168 hours. Lipid Profile: No results for input(s): CHOL, HDL, LDLCALC, TRIG, CHOLHDL, LDLDIRECT in the last 72 hours. Thyroid Function Tests: No results for input(s): TSH, T4TOTAL, FREET4, T3FREE, THYROIDAB in the last 72 hours. Anemia Panel: No results for input(s): VITAMINB12, FOLATE, FERRITIN, TIBC, IRON, RETICCTPCT in the last 72 hours. Urine analysis:    Component Value Date/Time   COLORURINE YELLOW (A) 12/13/2019 1051   APPEARANCEUR CLEAR (A) 12/13/2019 1051   APPEARANCEUR Clear 11/14/2011 1607   LABSPEC 1.010 12/13/2019 1051   LABSPEC 1.006 11/14/2011 1607   PHURINE 5.0 12/13/2019 1051   GLUCOSEU NEGATIVE 12/13/2019 1051   GLUCOSEU Negative 11/14/2011 1607   HGBUR NEGATIVE 12/13/2019 1051   BILIRUBINUR NEGATIVE 12/13/2019 1051   BILIRUBINUR Negative 11/14/2011 [place] 12/13/2019 [place] 12/13/2019 1051   NITRITE NEGATIVE 12/13/2019 [place] 12/13/2019 1051   LEUKOCYTESUR Negative 11/14/2011 1607    Radiological Exams on Admission: DG Chest 1 View  Result Date: 07/29/2020 CLINICAL DATA:  Chest pain and seizure. EXAM: CHEST  1 VIEW COMPARISON:  August 05, 2019 FINDINGS: The heart size and mediastinal contours are within normal limits. Small patchy is identified in the left lung base. The right lung is clear. There is no pleural effusion or pulmonary edema. Right shoulder replacement is noted. IMPRESSION: Small patchy is identified in the left lung base, developing pneumonia or nodule is not excluded. Electronically Signed   By: Abelardo Diesel M.D.   On: 07/29/2020 14:17   CT Head Wo Contrast  Result Date: 07/29/2020 CLINICAL DATA:  Seizures today. EXAM: CT HEAD WITHOUT CONTRAST TECHNIQUE: Contiguous axial images were obtained from the base of the skull through the vertex without intravenous contrast. COMPARISON:  August 05, 2019 FINDINGS: Brain: No evidence of acute infarction, hemorrhage, hydrocephalus, extra-axial collection or mass lesion/mass effect. There is mild chronic diffuse atrophy. Vascular: No hyperdense vessel is noted. Skull: Normal. Negative for fracture or focal lesion. Sinuses/Orbits: No acute finding. Other: None. IMPRESSION: 1. No focal acute intracranial abnormality identified. 2. Mild chronic diffuse atrophy. Electronically Signed   By: Abelardo Diesel M.D.   On: 07/29/2020 15:46    EKG: Independently reviewed.  Sinus rhythm with minimal ST depression in the lateral leads.  Assessment/Plan Principal Problem:   Aspiration pneumonia (HCC) Active Problems:   Hypokalemia   Essential hypertension   Seizure (HCC)   Depression   Seizure disorder (HCC)   Chronic diastolic heart failure (HCC)   Tobacco use   COPD (chronic obstructive pulmonary disease) (HCC)   Cirrhosis, non-alcoholic (HCC)    Aspiration pneumonia Present on admission Patient has a history of seizures and had multiple episodes of witnessed seizures at home She has a wet cough, was hypoxic with room air pulse oximetry of 86% and chest x-ray shows a left lower lobe infiltrate We will start patient on Rocephin and Flagyl for presumed aspiration pneumonia (patient  has a penicillin allergy) where is the  less okay where is stiffness with ibuprofen and sleepy and tired this time to go home call We will request speech therapy consult for swallow function evaluation Place patient on aspiration precautions Continue oxygen supplementation to maintain pulse oximetry greater than 92%    Seizure disorder Patient has a history of seizures and is on Vimpat 100 mg twice daily She had multiple witnessed seizure episodes and received a dose of lorazepam in the ER Son states that her triggers include low potassium and states that she is compliant with prescribed medications Neurology consult was requested and they recommend to increase Vimpat to 150 mg twice daily Place patient on lorazepam 2 mg IV every 4 as needed seizure Place patient on seizure precautions     COPD with chronic respiratory failure Acutely exacerbated most likely secondary to aspiration pneumonia Patient has chronic respiratory failure and is on 2 L of oxygen as needed during the day and at night She was noted to be hypoxic upon arrival to the ER with room air pulse oximetry of 86% Place patient on scheduled DuoNeb as well as Pulmicort nebulizer treatment Continue oxygen supplementation to maintain pulse oximetry greater than 92%    Chronic diastolic dysfunction CHF Continue furosemide, metoprolol and losartan    Nicotine dependence Smoking cessation was discussed with patient in detail Patient smokes 1/2 pack of cigarettes daily We will place patient on nicotine transdermal patch 14 mg daily    Depression Continue Celexa   Hypokalemia Secondary to diuretic use Supplement potassium  DVT prophylaxis: Lovenox Code Status: Full code Family Communication: Greater than 50% of time was spent discussing patient's condition and plan of care with her son Mr.Starlyn Skeans over the phone.  All questions and concerns have been addressed.  He verbalizes understanding and agrees with the  plan. Disposition Plan: Back to previous home environment Consults called: None    Nichola Warren MD [facility]     07/29/2020, 5:41 PM

## 2020-07-29 NOTE — ED Notes (Signed)
Pt's O2 saturation noted to be 88% on 2L. O2 increased to 4L nasal cannula with O2 saturation improvement to 93%.

## 2020-07-29 NOTE — ED Triage Notes (Signed)
Pt presents via POV with complaints of seizure per EMS. Per EMS pt has a history of seizures as well. Very audible, coarse breath sounds noted. Seizure witnessed upon entering the room with unresponsiveness focal seizure with left eye gaze. Per EMS, pt deaf at baseline.

## 2020-07-29 NOTE — ED Notes (Signed)
Pt complaining of right shoulder pain, grimacing when right shoulder touched. Ouma NP made aware and gave verbal orders.

## 2020-07-30 ENCOUNTER — Other Ambulatory Visit: Payer: Self-pay | Admitting: Student in an Organized Health Care Education/Training Program

## 2020-07-30 ENCOUNTER — Other Ambulatory Visit: Payer: Self-pay

## 2020-07-30 ENCOUNTER — Encounter: Payer: Self-pay | Admitting: Internal Medicine

## 2020-07-30 ENCOUNTER — Other Ambulatory Visit: Payer: Self-pay | Admitting: Neurology

## 2020-07-30 DIAGNOSIS — R569 Unspecified convulsions: Secondary | ICD-10-CM

## 2020-07-30 DIAGNOSIS — Z720 Tobacco use: Secondary | ICD-10-CM

## 2020-07-30 DIAGNOSIS — F32A Depression, unspecified: Secondary | ICD-10-CM

## 2020-07-30 DIAGNOSIS — I10 Essential (primary) hypertension: Secondary | ICD-10-CM

## 2020-07-30 DIAGNOSIS — J441 Chronic obstructive pulmonary disease with (acute) exacerbation: Secondary | ICD-10-CM

## 2020-07-30 LAB — CBC
HCT: 39.2 % (ref 36.0–46.0)
Hemoglobin: 12.9 g/dL (ref 12.0–15.0)
MCH: 27.4 pg (ref 26.0–34.0)
MCHC: 32.9 g/dL (ref 30.0–36.0)
MCV: 83.2 fL (ref 80.0–100.0)
Platelets: 176 10*3/uL (ref 150–400)
RBC: 4.71 MIL/uL (ref 3.87–5.11)
RDW: 12.8 % (ref 11.5–15.5)
WBC: 6.8 10*3/uL (ref 4.0–10.5)
nRBC: 0 % (ref 0.0–0.2)

## 2020-07-30 LAB — MAGNESIUM: Magnesium: 1.4 mg/dL — ABNORMAL LOW (ref 1.7–2.4)

## 2020-07-30 LAB — BASIC METABOLIC PANEL
Anion gap: 14 (ref 5–15)
BUN: 10 mg/dL (ref 8–23)
CO2: 29 mmol/L (ref 22–32)
Calcium: 8.5 mg/dL — ABNORMAL LOW (ref 8.9–10.3)
Chloride: 103 mmol/L (ref 98–111)
Creatinine, Ser: 0.52 mg/dL (ref 0.44–1.00)
GFR, Estimated: >60 mL/min (ref >60)
Glucose, Bld: 145 mg/dL — ABNORMAL HIGH (ref 70–99)
Potassium: 3 mmol/L — ABNORMAL LOW (ref 3.5–5.1)
Sodium: 146 mmol/L — ABNORMAL HIGH (ref 135–145)

## 2020-07-30 LAB — HIV ANTIBODY (ROUTINE TESTING W REFLEX): HIV Screen 4th Generation wRfx: NONREACTIVE

## 2020-07-30 LAB — SARS CORONAVIRUS 2 (TAT 6-24 HRS): SARS Coronavirus 2: NEGATIVE

## 2020-07-30 MED ORDER — FUROSEMIDE 10 MG/ML IJ SOLN: 20.0000 mg | Freq: Every day | INTRAMUSCULAR | Status: DC

## 2020-07-30 MED ORDER — POTASSIUM CHLORIDE 10 MEQ/100ML IV SOLN
10.0000 meq | INTRAVENOUS | Status: AC
  Administered 2020-07-30 (×3): 10 meq via INTRAVENOUS
  Filled 2020-07-30 (×3): qty 100

## 2020-07-30 MED ORDER — FUROSEMIDE 40 MG PO TABS: 40.0000 mg | ORAL_TABLET | Freq: Every day | ORAL | Status: DC

## 2020-07-30 MED ORDER — FUROSEMIDE 10 MG/ML IJ SOLN
40.0000 mg | Freq: Every day | INTRAMUSCULAR | Status: DC
  Administered 2020-07-30 – 2020-07-31 (×2): 40 mg via INTRAVENOUS
  Filled 2020-07-30 (×2): qty 4

## 2020-07-30 MED ORDER — SODIUM CHLORIDE 0.9 % IV SOLN
150.0000 mg | Freq: Two times a day (BID) | INTRAVENOUS | Status: DC
  Administered 2020-07-30 (×2): 150 mg via INTRAVENOUS
  Filled 2020-07-30 (×4): qty 15

## 2020-07-30 MED ORDER — POTASSIUM CHLORIDE 10 MEQ/100ML IV SOLN: 10.0000 meq | INTRAVENOUS | Status: DC

## 2020-07-30 MED ORDER — PANTOPRAZOLE SODIUM 40 MG IV SOLR
40.0000 mg | INTRAVENOUS | Status: DC
  Administered 2020-07-30 – 2020-08-01 (×3): 40 mg via INTRAVENOUS
  Filled 2020-07-30 (×3): qty 40

## 2020-07-30 MED ORDER — FLUTICASONE PROPIONATE 50 MCG/ACT NA SUSP
2.0000 | Freq: Every day | NASAL | Status: DC
  Filled 2020-07-30: qty 16

## 2020-07-30 MED ORDER — METOPROLOL TARTRATE 5 MG/5ML IV SOLN
5.0000 mg | Freq: Three times a day (TID) | INTRAVENOUS | Status: AC
  Administered 2020-07-30 – 2020-07-31 (×4): 5 mg via INTRAVENOUS
  Filled 2020-07-30 (×5): qty 5

## 2020-07-30 MED ORDER — BUDESONIDE 0.5 MG/2ML IN SUSP
0.5000 mg | Freq: Two times a day (BID) | RESPIRATORY_TRACT | Status: DC
  Administered 2020-07-30 – 2020-08-01 (×4): 0.5 mg via RESPIRATORY_TRACT
  Filled 2020-07-30 (×4): qty 2

## 2020-07-30 MED ORDER — POTASSIUM CHLORIDE 20 MEQ PO PACK: 40.0000 meq | PACK | ORAL | Status: DC

## 2020-07-30 MED ORDER — SODIUM CHLORIDE 0.9 % IV SOLN
INTRAVENOUS | Status: DC | PRN
  Administered 2020-07-30: 250 mL via INTRAVENOUS

## 2020-07-30 MED ORDER — LORAZEPAM 2 MG/ML IJ SOLN: 1.0000 mg | INTRAMUSCULAR | Status: DC | PRN

## 2020-07-30 MED ORDER — GUAIFENESIN ER 600 MG PO TB12
1200.0000 mg | ORAL_TABLET | Freq: Two times a day (BID) | ORAL | Status: DC
  Administered 2020-07-30 – 2020-08-01 (×4): 1200 mg via ORAL
  Filled 2020-07-30 (×4): qty 2

## 2020-07-30 MED ORDER — LORATADINE 10 MG PO TABS
10.0000 mg | ORAL_TABLET | Freq: Every day | ORAL | Status: DC
  Administered 2020-07-31 – 2020-08-01 (×2): 10 mg via ORAL
  Filled 2020-07-30 (×2): qty 1

## 2020-07-30 NOTE — Consult Note (Signed)
Neurology Consultation  Reason for Consult: seizures Referring Physician: Dr. Grandville Silos  CC: Seizures, Headache, right shoulder pain  History is obtained from: Chart, son  HPI: [name] is a 70 y.o. female with past medical history of deafness requiring ASL interpreter, CHF, cirrhosis, COPD, depression, restless leg syndrome, seizures-on Vimpat, brought to the hospital for evaluation of seizures. Presumably she was celebrating her birthday with family members when she had a generalized tonic-clonic seizures at home and another one with EMS.  EMS reports a total of 2 seizures that self resolved with her not being much postictal.  She did receive a dose of Ativan in the ER because she had another seizure that lasted about 2 minutes.  Her saturations on pulse oximetry at that time were 80% and she was placed on supplemental oxygen-of note, she is on home oxygen as well.  She was postictal after that Ativan and seizure for about 40 minutes in the ER. She was given additional Vimpat and her dose of 100 twice daily was increased to 150 twice daily and preparations were being made to discharge her but off of oxygen, her pulse ox kept dropping and she was admitted for further management. In the emergency room this morning, the RN reports noting 1/22 seizure with eyelid fluttering and head version to the left. I spoke with the son in detail this morning as well as reviewed neurology notes from her visit with Dr. Leonides Sake 03/26/2018. She has been living with the son for 4 years after she had fallen difficulty taking care of herself.  She was not very happy with the move as she will living independently.  Currently she is under some stress due to a family members untimely passing and estate related issues but other than that the son says the patient has not been under any abnormal amounts of stress.  Dr. Lars Masson notes mentioned focal seizures as well as possibilities of nonepileptic spells that have been  referred to in the past in terms of patient's seizure presentation.  Also, patient has been having off-and-on difficulty with swallowing.  She was evaluated at [facility] with the family was not given any results of her swallow study.  Most of the times, she is able to have a normal meal but sometimes she does require grounder small pieces of her meal.  The son says that she has a lot of cough from her COPD and continuously tries to clear her throat as well as bring phlegm out but during this seizure, she was trying to cough and bring phlegm out but was not able to expectorate much.  Patient is currently being treated for an aspiration pneumonia as well as being observed for these flurry of seizures that she has had.  She has not had a seizure at home and 6 months.  Son reports that she always has seizures when she is in the hospital with some medical issue and her body's in stress.   According to the chart review, maternal aunt with seizure history, otherwise normal birth and development, no history of febrile convulsions CNS infections or significant TBI.  No neurosurgical procedures.  She has hearing loss due to recurrent ear infections as a child in the orphanage.  One of her outpatient EEGs reported generalized discharges with shifting asymmetry.  MRI brain with no focal abnormalities. She has tried Depakote, Dilantin and Keppra all of which have given her side effects such as headaches, shakiness etc. She was on Trileptal and Vimpat at  the time of last outpatient visit with Dr. Delice Lesch, was recommended 48-hour EEG, which I do not think has happened as I do not see results.  She was also given nortriptyline for headache prophylaxis.  Physical therapy referral was given due to unsteady gait.  Seen again for seizures in January 2021 at [facility] where suspected brief seizure events without clear postictal state-suspicion for nonepileptic seizures in the past and tapering down  antiepileptics was continued.  Was discharged home on Vimpat 50 twice daily.   ROS: Performed and negative except as noted in HPI  Past Medical History:  Diagnosis Date  . Asthma   . CHF (congestive heart failure) ([place])   . Cirrhosis, non-alcoholic ([place])   . COPD (chronic obstructive pulmonary disease) ([place])   . Deaf   . Depression   . GERD (gastroesophageal reflux disease)   . Heart murmur   . Hepatitis   . Hypertension   . Lymph node disorder    arm  . Neuropathy   . On home oxygen therapy    hs  . Orthopnea   . RLS (restless legs syndrome)   . Seizures ([place])   . Shortness of breath dyspnea   . Sleep apnea   . Stroke [facility])    tia   Family History  Problem Relation Age of Onset  . Lung cancer Mother   . CAD Father    Social History:   reports that she has been smoking cigarettes. She started smoking about 52 years ago. She has a 25.00 pack-year smoking history. She has never used smokeless tobacco. She reports that she does not drink alcohol and does not use drugs.  Medications  Current Facility-Administered Medications:  .  budesonide (PULMICORT) nebulizer solution 0.5 mg, 0.5 mg, Nebulization, BID, Eugenie Filler, MD .  cefTRIAXone (ROCEPHIN) 1 g in sodium chloride 0.9 % 100 mL IVPB, 1 g, Intravenous, Q24H, Agbata, Tochukwu, MD .  citalopram (CELEXA) tablet 10 mg, 10 mg, Oral, Daily, Agbata, Tochukwu, MD, 10 mg at 07/29/20 2010 .  clopidogrel (PLAVIX) tablet 75 mg, 75 mg, Oral, Daily, Agbata, Tochukwu, MD, 75 mg at 07/29/20 2010 .  enoxaparin (LOVENOX) injection 40 mg, 40 mg, Subcutaneous, Q24H, Agbata, Tochukwu, MD, 40 mg at 07/29/20 2019 .  fluticasone (FLONASE) 50 MCG/ACT nasal spray 2 spray, 2 spray, Each Nare, Daily, Eugenie Filler, MD .  Derrill Memo ON 07/31/2020] furosemide (LASIX) tablet 40 mg, 40 mg, Oral, Daily, Eugenie Filler, MD .  guaiFENesin (MUCINEX) 12 hr tablet 1,200 mg, 1,200 mg, Oral, BID, Eugenie Filler, MD .  HYDROcodone-acetaminophen  (NORCO/VICODIN) 5-325 MG per tablet 1 tablet, 1 tablet, Oral, Q4H PRN, Agbata, Tochukwu, MD .  ipratropium-albuterol (DUONEB) 0.5-2.5 (3) MG/3ML nebulizer solution 3 mL, 3 mL, Nebulization, Q6H, Agbata, Tochukwu, MD, 3 mL at 07/30/20 0730 .  lacosamide (VIMPAT) tablet 150 mg, 150 mg, Oral, BID, Agbata, Tochukwu, MD, 150 mg at 07/29/20 2100 .  loratadine (CLARITIN) tablet 10 mg, 10 mg, Oral, Daily, Eugenie Filler, MD .  LORazepam (ATIVAN) injection 1 mg, 1 mg, Intravenous, Q4H PRN, Eugenie Filler, MD .  losartan (COZAAR) tablet 100 mg, 100 mg, Oral, Daily, Agbata, Tochukwu, MD, 100 mg at 07/29/20 2009 .  metoprolol succinate (TOPROL-XL) 24 hr tablet 25 mg, 25 mg, Oral, Daily, Agbata, Tochukwu, MD .  metroNIDAZOLE (FLAGYL) IVPB 500 mg, 500 mg, Intravenous, Q8H, Agbata, Tochukwu, MD, Stopped at 07/30/20 0250 .  montelukast (SINGULAIR) tablet 10 mg, 10 mg, Oral, QHS, Agbata,  Tochukwu, MD .  nicotine (NICODERM CQ - dosed in mg/24 hours) patch 14 mg, 14 mg, Transdermal, Daily, Agbata, Tochukwu, MD .  pantoprazole (PROTONIX) EC tablet 40 mg, 40 mg, Oral, Daily, Agbata, Tochukwu, MD, 40 mg at 07/29/20 2009 .  potassium chloride (KLOR-CON) packet 40 mEq, 40 mEq, Oral, Q4H, Eugenie Filler, MD .  potassium chloride SA (KLOR-CON) CR tablet 40 mEq, 40 mEq, Oral, Once, Agbata, Tochukwu, MD .  rosuvastatin (CRESTOR) tablet 10 mg, 10 mg, Oral, QPM, Agbata, Tochukwu, MD, 10 mg at 07/29/20 2100  Current Outpatient Medications:  .  citalopram (CELEXA) 10 MG tablet, Take 10 mg by mouth daily., Disp: , Rfl:  .  clopidogrel (PLAVIX) 75 MG tablet, Take 1 tablet (75 mg total) by mouth daily., Disp: 30 tablet, Rfl: 1 .  doxycycline (VIBRA-TABS) 100 MG tablet, Take 1 tablet (100 mg total) by mouth 2 (two) times daily., Disp: 14 tablet, Rfl: 0 .  furosemide (LASIX) 40 MG tablet, Take 40 mg by mouth daily., Disp: , Rfl:  .  Lacosamide 100 MG TABS, Take 1 tablet (100 mg total) by mouth 2 (two) times daily., Disp:  60 tablet, Rfl: 0 .  losartan (COZAAR) 100 MG tablet, Take 100 mg by mouth daily., Disp: , Rfl:  .  metoprolol succinate (TOPROL-XL) 25 MG 24 hr tablet, Take 1 tablet (25 mg total) by mouth daily., Disp: 60 tablet, Rfl: 0 .  omeprazole (PRILOSEC) 20 MG capsule, Take 20 mg by mouth daily., Disp: , Rfl:  .  potassium chloride (KLOR-CON) 10 MEQ tablet, Take 2 tablets (20 mEq total) by mouth daily., Disp: 60 tablet, Rfl: 0 .  acetaminophen (TYLENOL) 325 MG tablet, Take 2 tablets (650 mg total) by mouth every 6 (six) hours as needed for mild pain (or Fever >/= 101). (Patient not taking: Reported on 12/07/2019), Disp: 60 tablet, Rfl: 1 .  albuterol (PROVENTIL HFA;VENTOLIN HFA) 108 (90 BASE) MCG/ACT inhaler, Inhale 2 puffs into the lungs every 4 (four) hours as needed for wheezing or shortness of breath., Disp: , Rfl:  .  albuterol (PROVENTIL) (2.5 MG/3ML) 0.083% nebulizer solution, Take 3 mLs (2.5 mg total) by nebulization every 4 (four) hours as needed for wheezing or shortness of breath., Disp: 90 mL, Rfl: 5 .  docusate sodium (COLACE) 100 MG capsule, Take 1 capsule (100 mg total) by mouth 2 (two) times daily as needed for mild constipation. (Patient not taking: Reported on 12/09/2019), Disp: 10 capsule, Rfl: 0 .  montelukast (SINGULAIR) 10 MG tablet, Take 10 mg by mouth at bedtime. , Disp: , Rfl: 4 .  OXYGEN, Inhale 2 L into the lungs at bedtime., Disp: , Rfl:  .  rosuvastatin (CRESTOR) 10 MG tablet, Take 10 mg by mouth daily., Disp: , Rfl: 5  Exam: Current vital signs: BP (!) 148/58   Pulse 61   Temp 97.9 F (36.6 C) (Oral)   Resp 20   SpO2 94%  Vital signs in last 24 hours: Temp:  [97.9 F (36.6 C)-99 F (37.2 C)] 97.9 F (36.6 C) (01/24 0351) Pulse Rate:  [53-70] 61 (01/24 0630) Resp:  [16-36] 20 (01/24 0630) BP: (119-161)/(57-77) 148/58 (01/24 0600) SpO2:  [88 %-99 %] 94 % (01/24 0630) General: Sleeping, opens eyes to mild tap on the shoulder. HEENT: Normocephalic, atraumatic CVS:  Regular rate rhythm Respiratory: Has scattered rales all over Extremities warm well perfused Neurological exam She is awake, alert, oriented to self and the fact that she is in the hospital-examination was done  with the help of ASL interpreter on the iPad. She is unable to tell me the date. Her responses were extremely slow. Cranial nerves: Pupils equal round reactive to light, extraocular movements appeared unhindered, blinks to threat from both sides, face appears symmetric, tongue and palate midline.  She is deaf in both ears. Motor examination: Right upper extremity is barely antigravity-secondary to pain.  She pointed to the pain being in her shoulder arm right side of the neck and radiating to her head.  All other 4 extremities are antigravity with moderate effort dependent weakness. Sensory examination: Intact to touch all over Coordination: Difficult to perform given her cooperation with the exam  Labs I have reviewed labs in epic and the results pertinent to this consultation are:   CBC    Component Value Date/Time   WBC 6.8 07/30/2020 0353   RBC 4.71 07/30/2020 0353   HGB 12.9 07/30/2020 0353   HGB 15.3 11/05/2013 1138   HCT 39.2 07/30/2020 0353   HCT 44.8 11/05/2013 1138   PLT 176 07/30/2020 0353   PLT 214 11/05/2013 1138   MCV 83.2 07/30/2020 0353   MCV 85 11/05/2013 1138   MCH 27.4 07/30/2020 0353   MCHC 32.9 07/30/2020 0353   RDW 12.8 07/30/2020 0353   RDW 13.3 11/05/2013 1138   LYMPHSABS 2.3 07/29/2020 1345   LYMPHSABS 1.4 04/24/2013 0614   MONOABS 0.5 07/29/2020 1345   MONOABS 0.2 04/24/2013 0614   EOSABS 0.1 07/29/2020 1345   EOSABS 0.0 04/24/2013 0614   BASOSABS 0.0 07/29/2020 1345   BASOSABS 0.0 04/24/2013 0614    CMP     Component Value Date/Time   NA 146 (H) 07/30/2020 0353   NA 139 11/05/2013 1138   K 3.0 (L) 07/30/2020 0353   K 3.3 (L) 11/05/2013 1138   CL 103 07/30/2020 0353   CL 103 11/05/2013 1138   CO2 29 07/30/2020 0353   CO2 29  11/05/2013 1138   GLUCOSE 145 (H) 07/30/2020 0353   GLUCOSE 219 (H) 11/05/2013 1138   BUN 10 07/30/2020 0353   BUN 9 11/05/2013 1138   CREATININE 0.52 07/30/2020 0353   CREATININE 0.35 (L) 11/05/2013 1138   CALCIUM 8.5 (L) 07/30/2020 0353   CALCIUM 8.9 11/05/2013 1138   PROT 7.0 07/29/2020 1345   PROT 7.0 11/05/2013 1138   ALBUMIN 3.6 07/29/2020 1345   ALBUMIN 3.4 11/05/2013 1138   AST 27 07/29/2020 1345   AST 48 (H) 11/05/2013 1138   ALT 16 07/29/2020 1345   ALT 35 11/05/2013 1138   ALKPHOS 108 07/29/2020 1345   ALKPHOS 145 (H) 11/05/2013 1138   BILITOT 0.6 07/29/2020 1345   BILITOT 0.3 11/05/2013 1138   GFRNONAA >60 07/30/2020 0353   GFRNONAA >60 11/05/2013 1138   GFRAA >60 12/24/2019 0514   GFRAA >60 11/05/2013 1138    Lipid Panel     Component Value Date/Time   TRIG 133 04/29/2017 1523     Imaging I have reviewed the images obtained:  CT-scan of the brain-no acute changes.  Mild diffuse atrophy   Assessment:  70 year old with significant past medical history of seizures as well as question of nonepileptic spells, currently on Vimpat, COPD, on home oxygen needed during the night as needed, nicotine dependence, hypertension, chronic diastolic CHF, nonalcoholic cirrhosis, deafness brought in for evaluation of multiple seizures in the last 24 hours. According to the son, other than the fact that there is some legal issues related to a sudden death of a family member  requiring court hearings etc. for estate planning, no other stressors. Seizures have usually been a day but only recurrent resurface whenever her body is not stressed-either medically or psychologically. All other seizures described by ER RN did sound like an epileptic spell but there have been multiple seizures some of them without postictal state but the other with a prolonged postictal state-although with a prolonged postictal state seizure she had received the Ativan-making the assessment rather marked at  this time. Irrespective, patients with nonepileptic spells, also do tend to have a higher incidence of underlying epilepsy and she has documented epilepsy, I am not going to question the diagnosis but at this time, will manage the current presentation.  Impression: Breakthrough seizures Concern for nonepileptic spells Right shoulder pain and headaches-headaches are chronic. Other medical conditions as listed in HPI  Recommendations: Continue with increased dose of Vimpat at 150 twice daily Ativan 1 mg IV as needed for seizure lasting more than 5 minutes Seizure precautions I have ordered a routine EEG I do not see a need for an MRI at this time She continues to complain of right shoulder pain-we will defer management to the primary team Can continue her Celexa for headache prevention and can follow-up with outpatient neurology for management of headaches. I will follow the EEG with you. Preliminary plan discussed with Dr. Grandville Silos via secure chat.  I will have a detailed conversation with him over the phone during the course of the day. Please not hesitate to call me with questions. Please notify me if the patient has recurrent or prolonged seizures. I also communicated the plan with the patient's bedside RN. -- Amie Portland, MD Neurologist [facility] Pager: [phone]

## 2020-07-30 NOTE — Evaluation (Signed)
Clinical/Bedside Swallow Evaluation Patient Details  Name: [name] MRN: [id number] Date of Birth: April 27, 1951  Today's Date: 07/30/2020 Time: SLP Start Time (ACUTE ONLY): 0820 SLP Stop Time (ACUTE ONLY): 0840 SLP Time Calculation (min) (ACUTE ONLY): 20 min  Past Medical History:  Past Medical History:  Diagnosis Date  . Asthma   . CHF (congestive heart failure) ([place])   . Cirrhosis, non-alcoholic ([place])   . COPD (chronic obstructive pulmonary disease) ([place])   . Deaf   . Depression   . GERD (gastroesophageal reflux disease)   . Heart murmur   . Hepatitis   . Hypertension   . Lymph node disorder    arm  . Neuropathy   . On home oxygen therapy    hs  . Orthopnea   . RLS (restless legs syndrome)   . Seizures ([place])   . Shortness of breath dyspnea   . Sleep apnea   . Stroke [facility])    tia   Past Surgical History:  Past Surgical History:  Procedure Laterality Date  . CATARACT EXTRACTION W/PHACO Right 11/23/2014   Procedure: CATARACT EXTRACTION PHACO AND INTRAOCULAR LENS PLACEMENT (IOC);  Surgeon: Lyla Glassing, MD;  Location: [facility] ORS;  Service: Ophthalmology;  Laterality: Right;  . CATARACT EXTRACTION W/PHACO Left 12/14/2014   Procedure: CATARACT EXTRACTION PHACO AND INTRAOCULAR LENS PLACEMENT (IOC);  Surgeon: Lyla Glassing, MD;  Location: [facility] ORS;  Service: Ophthalmology;  Laterality: Left;  US:01:16.6 AP:15.8 CDE:12.14  . CESAREAN SECTION    . CHOLECYSTECTOMY    . REVERSE SHOULDER ARTHROPLASTY Right 12/21/2019   Procedure: REVERSE SHOULDER ARTHROPLASTY;  Surgeon: Lovell Sheehan, MD;  Location: [facility] ORS;  Service: Orthopedics;  Laterality: Right;  . THUMB ARTHROSCOPY    . TONSILLECTOMY    . TYMPANOPLASTY     muliple   HPI:  [name] is a 70 y.o. female with medical history significant for COPD on home oxygen 2 L as needed during the day and at night, nicotine dependence, seizure disorder, hypertension, chronic diastolic dysfunction CHF, nonalcoholic  cirrhosis, deafness who presents to the ER by EMS for evaluation of multiple witnessed seizures at home. She was hypoxic in the field with room air pulse oximetry of 86% and has a loose wet cough.  Chest x-ray shows a left lower lobe infiltrate concerning for possible aspiration.   Assessment / Plan / Recommendation Clinical Impression  Pt presents with adequate oral motor abilities. Pt consumed 3 sips of thin liquids via straw with no overt s/s of aspiration. Pt's swallow appeared swift, vitals remained stable. As this writer was opening the container of applesauce, pt began seizing. While seizing, pt began coughing, became asphyxiated requiring suction to clear secretions. Nurse present during seizures. Post seizure, pt was attentive to this therapist and communicate via ASL with me. At this time, pt is at a very high risk of aspirating during seizures. Pt should remain NPO until seizure activity is well controlled. ST to follow. SLP Visit Diagnosis: Dysphagia, unspecified (R13.10) (aspiraiton during seizure activity)    Aspiration Risk  Severe aspiration risk (aspiration during seizure activity)    Diet Recommendation NPO        Other  Recommendations Oral Care Recommendations: Oral care QID Other Recommendations: Remove water pitcher;Prohibited food (jello, ice cream, thin soups);Have oral suction available   Follow up Recommendations  (TBD)      Frequency and Duration min 2x/week  2 weeks       Prognosis Prognosis for Safe Diet Advancement: Good Barriers  to Reach Goals:  (seizure activity)      Swallow Study   General Date of Onset: 07/29/20 HPI: [name] is a 70 y.o. female with medical history significant for COPD on home oxygen 2 L as needed during the day and at night, nicotine dependence, seizure disorder, hypertension, chronic diastolic dysfunction CHF, nonalcoholic cirrhosis, deafness who presents to the ER by EMS for evaluation of multiple witnessed seizures at  home. She was hypoxic in the field with room air pulse oximetry of 86% and has a loose wet cough.  Chest x-ray shows a left lower lobe infiltrate concerning for possible aspiration. Type of Study: Bedside Swallow Evaluation Previous Swallow Assessment: 04/2017 - BSE recommended age appropriate regular with thin liquids, medicine whole in puree Diet Prior to this Study: NPO Temperature Spikes Noted: No Respiratory Status: Nasal cannula History of Recent Intubation: No Behavior/Cognition: Alert;Cooperative;Pleasant mood Oral Cavity Assessment: Within Functional Limits Oral Care Completed by SLP: Recent completion by staff Oral Cavity - Dentition: Dentures, top;Dentures, bottom Vision: Functional for self-feeding Self-Feeding Abilities: Needs set up;Able to feed self Patient Positioning: Upright in bed Baseline Vocal Quality: Not observed (d/t deafness) Volitional Cough: Strong Volitional Swallow: Able to elicit    Oral/Motor/Sensory Function Overall Oral Motor/Sensory Function: Within functional limits   Ice Chips Ice chips: Not tested   Thin Liquid Thin Liquid: Within functional limits Presentation: Self Fed;Straw Other Comments: Pt consumed 2 sips of thin water via straw before seizing    Nectar Thick Nectar Thick Liquid: Not tested   Honey Thick Honey Thick Liquid: Not tested   Puree Puree: Not tested   Solid    [name] M.S., CCC-SLP, [facility] Speech-Language Pathologist Rehabilitation Services Office [phone]  Solid: Not tested      [facility] 07/30/2020,9:00 AM

## 2020-07-30 NOTE — Procedures (Addendum)
eeg to be done portably

## 2020-07-30 NOTE — Progress Notes (Signed)
eeg done 

## 2020-07-30 NOTE — ED Notes (Signed)
Per Dr Rory Percy, only give ativan for seizure more than 5 mins

## 2020-07-30 NOTE — Procedures (Signed)
Patient Name: [name]  MRN: [id number]  Epilepsy Attending: Lora Havens  Referring Physician/Provider: Dr Amie Portland Date: 07/30/2020 Duration: 21.06 mins  Patient history: 70 year old with significant past medical history of seizures as well as question of nonepileptic spells, currently on Vimpat, brought in for evaluation of multiple seizures in the last 24 hours. EEG to evaluate for seizure  Level of alertness: Awake  AEDs during EEG study: LCM  Technical aspects: This EEG study was done with scalp electrodes positioned according to the 10-20 International system of electrode placement. Electrical activity was acquired at a sampling rate of 500Hz and reviewed with a high frequency filter of 70Hz and a low frequency filter of 1Hz. EEG data were recorded continuously and digitally stored.   Description: The posterior dominant rhythm consists of 8 Hz activity of moderate voltage (25-35 uV) seen predominantly in posterior head regions, symmetric and reactive to eye opening and eye closing. Hyperventilation and photic stimulation were not performed.     IMPRESSION: This study is within normal limits. No seizures or epileptiform discharges were seen throughout the recording.  [name]

## 2020-07-30 NOTE — Progress Notes (Signed)
  Speech Language Pathology Treatment: Dysphagia  Patient Details Name: [name] MRN: [id number] DOB: 1951/02/03 Today's Date: 07/30/2020 Time: 0051-1021 SLP Time Calculation (min) (ACUTE ONLY): 30 min  Assessment / Plan / Recommendation Clinical Impression  I spoke with pt's son via telephone. He states that [name] has been living with him since early 2019. He does recall a swallowing evaluation at [facility] but is unable to recall details. He reports that pt occasionally coughs when eating but her coughing is not related to a specific food item or liquid. When chewing meats, pt occasionally tucks her chin to swallow and when consuming her medicine whole with thin liquids, pt is free of any coughing/choking.   Chart reveals a BSE at [facility] on 07/19/2018 with recommendation of regular diet with thin liquids. It was also recommended that pt have an instrumental study d/t frequency of PNAs. However, son reports that once pt started living with him that she has had fewer PNAs. Suspect that there is likely an environmental component to her s/s of dysphagia.   Skilled treatment session focused on pt's oropharyngeal ability to consume water and purees. During evaluation earlier this morning, pt demonstrated appropriate ability when consuming 3 sips of thin liquids, however trials were discontinued d/t seizure. I personally spoke with Dr Rory Percy and pt is appropriately medicated for seizures therefore PO trials were appropriate this afternoon. Skilled observation was provided of pt consuming ice chips, thin liquids via cup, via straw, single sips, consecutive sips and puree. Clinically at bedside pt is free of any oropharyngeal dysphagia and aspiration. Her swallow appeared swift and all vitals remained within normal limits. I spoke with pt's nurse who voices concern with pt's inability to effectively expectorate secretions. Suspect that pt's secretions are likely thicker d/t NPO status.   Out of an abundance  of caution, recommend clear liquid diet and medicine via IV until completion of instrumental study on 07/31/2020. An instrumental study is appropriate to rule out silent aspiration and to follow up with recommendations made during most recent BSE at [facility].     HPI HPI: [name] is a 70 y.o. female with medical history significant for COPD on home oxygen 2 L as needed during the day and at night, nicotine dependence, seizure disorder, hypertension, chronic diastolic dysfunction CHF, nonalcoholic cirrhosis, deafness who presents to the ER by EMS for evaluation of multiple witnessed seizures at home. She was hypoxic in the field with room air pulse oximetry of 86% and has a loose wet cough.  Chest x-ray shows a left lower lobe infiltrate concerning for possible aspiration.      SLP Plan  MBS       Recommendations  Diet recommendations: Thin liquid (clear liquid diet) Liquids provided via: Cup;Straw Medication Administration: Via alternative means Supervision: Patient able to self feed Compensations: Minimize environmental distractions;Slow rate;Small sips/bites Postural Changes and/or Swallow Maneuvers: Seated upright 90 degrees;Upright 30-60 min after meal                Oral Care Recommendations: Oral care QID Follow up Recommendations:  (TBD) SLP Visit Diagnosis: Dysphagia, unspecified (R13.10) Plan: MBS       GO               Fahd Galea B. Rutherford Nail M.S., CCC-SLP, IXL Office [phone]  Stormy Fabian 07/30/2020, 4:35 PM

## 2020-07-30 NOTE — ED Notes (Signed)
Pt assisted to restroom

## 2020-07-30 NOTE — Progress Notes (Signed)
PROGRESS NOTE    [name]  IDP:[id number] DOB: 1951-03-28 DOA: 07/29/2020 PCP: [facility]    Chief Complaint  Patient presents with  . Seizures    Brief Narrative:  Patient 70 year old female history of COPD on 2 L home O2 at night and during the day as needed, nicotine dependence, seizure disorder, hypertension, chronic diastolic dysfunction, nonalcoholic cirrhosis, deafness presented to the ED for evaluation for multiple witnessed seizures at home. Patient also noted to have seizures in the ED. Also patient noted to be hypoxic chest x-ray and clinical findings concerning for aspiration pneumonia. CT head done negative. Chest x-ray concerning for small patchy infiltrate in the left lung base. Neurology consulted. Speech therapy consulted.   Assessment & Plan:   Principal Problem:   Aspiration pneumonia ([place]) Active Problems:   Essential hypertension   Seizure ([place])   Depression   Hypokalemia   Seizure disorder ([place])   Chronic diastolic heart failure (HCC)   Tobacco use   COPD (chronic obstructive pulmonary disease) (HCC)   Cirrhosis, non-alcoholic (HCC)  1 aspiration pneumonia Patient presented noted to be hypoxic, presented with recurrent seizures, concern patient may have aspirated as a result of seizures. Patient seen by speech therapy and during evaluation patient was noted to have seizure-like activity and noted to be high risk for aspiration. Patient currently n.p.o. Speech therapy recommended modified barium swallow. Check urine Legionella antigen. Check urine pneumococcus antigen. Continue empiric IV Rocephin, IV Flagyl. Supportive care. Follow.  2. Seizure disorder Patient presented with recurrent seizure episodes. Patient noted to be on Vimpat 100 mg twice daily prior to admission. Patient received a dose of Ativan in the ER. It was noted per admitting physician that patient son stated triggers for seizures a low potassium. It is noted  that patient compliant with medications. Neurology consulted and recommendation was to increase Vimpat to 150 mg twice daily. Due to patient's increased aspiration risk and current n.p.o. status will change Vimpat to IV 150 mg every 12 hours. Placed on IV Ativan as needed seizures greater than 5 minutes per neurology recommendations. Neurology consulted, EEG ordered, neurology following and I appreciate the input and recommendations.  3. Chronic respiratory failure/COPD Felt acutely exacerbated secondary to aspiration pneumonia. Patient on 2 L oxygen chronically at bedtime and as needed during the day. Patient noted to be hypoxic on presentation to the ED. Continue scheduled duo nebs. Increase Pulmicort to 0.5 mg twice daily. Continue antibiotics. Follow.  4. Chronic diastolic CHF Stable. Change oral furosemide to IV, change oral metoprolol to IV Lopressor. Hold losartan. Patient with increased aspiration risk. Once patient able to tolerate a diet will transition back to oral medications. Follow.  5. Nicotine dependence Tobacco cessation. Continue nicotine patch.  6. Depression Resume Celexa when patient able to tolerate oral intake.  7. Hypokalemia Due to current n.p.o. status will place on KCl 10 mEq IV every 1 hour x4 runs.  8.    DVT prophylaxis: Lovenox Code Status: Full Family Communication: Updated patient. No family at bedside. Disposition:   Status is: Inpatient    Dispo: The patient is from: Home. Lives with her son.              Anticipated d/c is to: To be determined              Anticipated d/c date is: To be determined              Patient currently on IV antibiotics for concern  for aspiration pneumonia, being treated for seizures, neurology following, not stable for discharge.       Consultants:   Neurology: Dr.Arora 07/30/2020  Procedures:   CT head 07/29/2020  Plain films of the right shoulder 07/29/2020  Chest x-ray 07/29/2020  Antimicrobials:   IV  Rocephin 07/29/2020>>>>  IV Flagyl 07/29/2020>>>>>   Subjective: Dry heaving. No CP. No SOB. No abd pain. Patient noted this morning while being assessed by speech therapy to have seizure-like activity. Per RN patient high aspiration risk and at this time unsafe to give oral medications.  Objective: Vitals:   07/30/20 0545 07/30/20 0600 07/30/20 0615 07/30/20 0630  BP:  (!) 148/58    Pulse: 60 64  61  Resp: (!) 27 (!) 28 (!) 23 20  Temp:      TempSrc:      SpO2: 92% 91% 92% 94%    Intake/Output Summary (Last 24 hours) at 07/30/2020 1104 Last data filed at 07/29/2020 2309 Gross per 24 hour  Intake --  Output 400 ml  Net -400 ml   There were no vitals filed for this visit.  Examination:  General exam: Sitting up. Dry heaves. Respiratory system: Some scattered coarse rhonchorous breath sounds. No wheezing. No crackles.  Cardiovascular system: S1 & S2 heard, RRR. No JVD, murmurs, rubs, gallops or clicks. No pedal edema. Gastrointestinal system: Abdomen is nondistended, soft and nontender. No organomegaly or masses felt. Normal bowel sounds heard. Central nervous system: Alert and oriented. No focal neurological deficits. Extremities: Symmetric 5 x 5 power. Skin: No rashes, lesions or ulcers Psychiatry: Judgement and insight appear normal. Mood & affect appropriate.     Data Reviewed: I have personally reviewed following labs and imaging studies  CBC: Recent Labs  Lab 07/29/20 1345 07/30/20 0353  WBC 7.6 6.8  NEUTROABS 4.7  --   HGB 14.4 12.9  HCT 43.7 39.2  MCV 82.6 83.2  PLT 213 867    Basic Metabolic Panel: Recent Labs  Lab 07/29/20 1345 07/30/20 0353  NA 141 146*  K 3.0* 3.0*  CL 102 103  CO2 28 29  GLUCOSE 203* 145*  BUN 10 10  CREATININE 0.52 0.52  CALCIUM 8.7* 8.5*    GFR: CrCl cannot be calculated (Unknown ideal weight.).  Liver Function Tests: Recent Labs  Lab 07/29/20 1345  AST 27  ALT 16  ALKPHOS 108  BILITOT 0.6  PROT 7.0  ALBUMIN  3.6    CBG: No results for input(s): GLUCAP in the last 168 hours.   Recent Results (from the past 240 hour(s))  SARS CORONAVIRUS 2 (TAT 6-24 HRS) Nasopharyngeal Nasopharyngeal Swab     Status: None   Collection Time: 07/29/20  2:37 PM   Specimen: Nasopharyngeal Swab  Result Value Ref Range Status   SARS Coronavirus 2 NEGATIVE NEGATIVE Final    Comment: (NOTE) SARS-CoV-2 target nucleic acids are NOT DETECTED.  The SARS-CoV-2 RNA is generally detectable in upper and lower respiratory specimens during the acute phase of infection. Negative results do not preclude SARS-CoV-2 infection, do not rule out co-infections with other pathogens, and should not be used as the sole basis for treatment or other patient management decisions. Negative results must be combined with clinical observations, patient history, and epidemiological information. The expected result is Negative.  Fact Sheet for Patients: SugarRoll.be  Fact Sheet for Healthcare Providers: https://www.woods-mathews.com/  This test is not yet approved or cleared by the [place] FDA and  has been authorized for detection and/or diagnosis of  SARS-CoV-2 by FDA under an Emergency Use Authorization (EUA). This EUA will remain  in effect (meaning this test can be used) for the duration of the COVID-19 declaration under Se ction 564(b)(1) of the Act, 21 U.S.C. section 360bbb-3(b)(1), unless the authorization is terminated or revoked sooner.  Performed at [facility], [place] [address] [place], [place] [address]          Radiology Studies: DG Chest 1 View  Result Date: 07/29/2020 CLINICAL DATA:  Chest pain and seizure. EXAM: CHEST  1 VIEW COMPARISON:  August 05, 2019 FINDINGS: The heart size and mediastinal contours are within normal limits. Small patchy is identified in the left lung base. The right lung is clear. There is no pleural effusion or pulmonary edema. Right  shoulder replacement is noted. IMPRESSION: Small patchy is identified in the left lung base, developing pneumonia or nodule is not excluded. Electronically Signed   By: Abelardo Diesel M.D.   On: 07/29/2020 14:17   DG Shoulder Right  Result Date: 07/29/2020 CLINICAL DATA:  [place], right shoulder pain, history of right shoulder arthroplasty EXAM: RIGHT SHOULDER - 2+ VIEW COMPARISON:  12/21/2019 FINDINGS: Internal rotation, external rotation, and transscapular views of the right shoulder are obtained. Right shoulder arthroplasty is identified in expected position without signs of acute complication. No acute displaced fracture. Stable hypertrophic changes of the acromioclavicular joint. The right chest is clear. IMPRESSION: 1. Stable right shoulder arthroplasty. No evidence of acute fracture. Electronically Signed   By: Randa Ngo M.D.   On: 07/29/2020 20:50   CT Head Wo Contrast  Result Date: 07/29/2020 CLINICAL DATA:  Seizures today. EXAM: CT HEAD WITHOUT CONTRAST TECHNIQUE: Contiguous axial images were obtained from the base of the skull through the vertex without intravenous contrast. COMPARISON:  August 05, 2019 FINDINGS: Brain: No evidence of acute infarction, hemorrhage, hydrocephalus, extra-axial collection or mass lesion/mass effect. There is mild chronic diffuse atrophy. Vascular: No hyperdense vessel is noted. Skull: Normal. Negative for fracture or focal lesion. Sinuses/Orbits: No acute finding. Other: None. IMPRESSION: 1. No focal acute intracranial abnormality identified. 2. Mild chronic diffuse atrophy. Electronically Signed   By: Abelardo Diesel M.D.   On: 07/29/2020 15:46        Scheduled Meds: . budesonide (PULMICORT) nebulizer solution  0.5 mg Nebulization BID  . citalopram  10 mg Oral Daily  . clopidogrel  75 mg Oral Daily  . enoxaparin (LOVENOX) injection  40 mg Subcutaneous Q24H  . fluticasone  2 spray Each Nare Daily  . [START ON 07/31/2020] furosemide  40 mg Oral Daily  .  guaiFENesin  1,200 mg Oral BID  . ipratropium-albuterol  3 mL Nebulization Q6H  . loratadine  10 mg Oral Daily  . losartan  100 mg Oral Daily  . metoprolol tartrate  5 mg Intravenous Q8H  . montelukast  10 mg Oral QHS  . nicotine  14 mg Transdermal Daily  . pantoprazole (PROTONIX) IV  40 mg Intravenous Q24H  . rosuvastatin  10 mg Oral QPM   Continuous Infusions: . cefTRIAXone (ROCEPHIN)  IV    . lacosamide (VIMPAT) IV    . metronidazole Stopped (07/30/20 0250)  . potassium chloride       LOS: 1 day    Time spent: 35 minutes    Irine Seal, MD [facility]   To contact the attending provider between 7A-7P or the covering provider during after hours 7P-7A, please log into the web site [email or web address] and access using universal [place]  password for that web site. If you do not have the password, please call the hospital operator.  07/30/2020, 11:04 AM

## 2020-07-30 NOTE — ED Notes (Signed)
Speech in with pt, sip of water given and immediately after that pt began having seizure like activity. Jerky head movement with eye gaze to the left was present, lasted about 20seconds. After seizure activity pt began to cough and choke. This RN suctioned patient to clear airway. Pt back to baseline when this RN left the room. MD made aware.

## 2020-07-30 NOTE — ED Notes (Signed)
Pt assisted to toilet

## 2020-07-30 NOTE — ED Notes (Signed)
Pt ambulatory to restroom, nad noted, steady gait

## 2020-07-30 NOTE — Progress Notes (Signed)
Spoke with patient's son via telephone in order to complete admission profile

## 2020-07-31 ENCOUNTER — Inpatient Hospital Stay: Payer: Medicare Other

## 2020-07-31 DIAGNOSIS — J690 Pneumonitis due to inhalation of food and vomit: Principal | ICD-10-CM

## 2020-07-31 DIAGNOSIS — R1319 Other dysphagia: Secondary | ICD-10-CM | POA: Diagnosis not present

## 2020-07-31 LAB — BASIC METABOLIC PANEL
Anion gap: 13 (ref 5–15)
BUN: 10 mg/dL (ref 8–23)
CO2: 26 mmol/L (ref 22–32)
Calcium: 8.4 mg/dL — ABNORMAL LOW (ref 8.9–10.3)
Chloride: 103 mmol/L (ref 98–111)
Creatinine, Ser: 0.48 mg/dL (ref 0.44–1.00)
GFR, Estimated: >60 mL/min (ref >60)
Glucose, Bld: 107 mg/dL — ABNORMAL HIGH (ref 70–99)
Potassium: 2.9 mmol/L — ABNORMAL LOW (ref 3.5–5.1)
Sodium: 142 mmol/L (ref 135–145)

## 2020-07-31 LAB — CBC
HCT: 40.1 % (ref 36.0–46.0)
Hemoglobin: 13.1 g/dL (ref 12.0–15.0)
MCH: 27.5 pg (ref 26.0–34.0)
MCHC: 32.7 g/dL (ref 30.0–36.0)
MCV: 84.1 fL (ref 80.0–100.0)
Platelets: 170 10*3/uL (ref 150–400)
RBC: 4.77 MIL/uL (ref 3.87–5.11)
RDW: 12.9 % (ref 11.5–15.5)
WBC: 4.7 10*3/uL (ref 4.0–10.5)
nRBC: 0 % (ref 0.0–0.2)

## 2020-07-31 LAB — MAGNESIUM: Magnesium: 1.3 mg/dL — ABNORMAL LOW (ref 1.7–2.4)

## 2020-07-31 LAB — BRAIN NATRIURETIC PEPTIDE: B Natriuretic Peptide: 140.5 pg/mL — ABNORMAL HIGH (ref 0.0–100.0)

## 2020-07-31 MED ORDER — FUROSEMIDE 40 MG PO TABS
40.0000 mg | ORAL_TABLET | Freq: Every day | ORAL | Status: DC
  Administered 2020-08-01: 40 mg via ORAL
  Filled 2020-07-31: qty 1

## 2020-07-31 MED ORDER — METRONIDAZOLE 500 MG PO TABS
500.0000 mg | ORAL_TABLET | Freq: Three times a day (TID) | ORAL | Status: DC
  Administered 2020-07-31 – 2020-08-01 (×2): 500 mg via ORAL
  Filled 2020-07-31 (×2): qty 1

## 2020-07-31 MED ORDER — METOPROLOL SUCCINATE ER 25 MG PO TB24: 25.0000 mg | ORAL_TABLET | Freq: Every day | ORAL | Status: DC

## 2020-07-31 MED ORDER — LOSARTAN POTASSIUM 50 MG PO TABS: 100.0000 mg | ORAL_TABLET | Freq: Every day | ORAL | Status: DC

## 2020-07-31 MED ORDER — ACETAMINOPHEN 325 MG PO TABS: 650.0000 mg | ORAL_TABLET | ORAL | Status: DC | PRN

## 2020-07-31 MED ORDER — METOPROLOL SUCCINATE ER 25 MG PO TB24
25.0000 mg | ORAL_TABLET | Freq: Every day | ORAL | Status: DC
  Administered 2020-08-01: 25 mg via ORAL
  Filled 2020-07-31: qty 1

## 2020-07-31 MED ORDER — POTASSIUM CHLORIDE 20 MEQ PO PACK
40.0000 meq | PACK | ORAL | Status: AC
Start: 2020-07-31 — End: 2020-08-01
  Administered 2020-07-31: 40 meq via ORAL
  Filled 2020-07-31: qty 2

## 2020-07-31 MED ORDER — LACOSAMIDE 50 MG PO TABS
150.0000 mg | ORAL_TABLET | Freq: Two times a day (BID) | ORAL | Status: DC
  Administered 2020-07-31 – 2020-08-01 (×3): 150 mg via ORAL
  Filled 2020-07-31 (×3): qty 3

## 2020-07-31 MED ORDER — POTASSIUM CHLORIDE 10 MEQ/100ML IV SOLN
10.0000 meq | INTRAVENOUS | Status: DC
  Administered 2020-07-31 (×2): 10 meq via INTRAVENOUS
  Filled 2020-07-31 (×3): qty 100

## 2020-07-31 MED ORDER — POTASSIUM CHLORIDE 20 MEQ PO PACK
40.0000 meq | PACK | ORAL | Status: DC
  Filled 2020-07-31: qty 2

## 2020-07-31 MED ORDER — MAGNESIUM SULFATE 4 GM/100ML IV SOLN
4.0000 g | Freq: Once | INTRAVENOUS | Status: AC
  Administered 2020-07-31: 4 g via INTRAVENOUS
  Filled 2020-07-31: qty 100

## 2020-07-31 NOTE — Progress Notes (Signed)
PHARMACIST - PHYSICIAN COMMUNICATION  DR:  Grandville Silos  CONCERNING: IV to Oral Route Change Policy  RECOMMENDATION: This patient is receiving Lacosamide by the intravenous route.  Based on criteria approved by the Pharmacy and Therapeutics Committee, the intravenous medication(s) is/are being converted to the equivalent oral dose form(s).   DESCRIPTION: These criteria include:  The patient is eating (either orally or via tube) and/or has been taking other orally administered medications for a least 24 hours  The patient has no evidence of active gastrointestinal bleeding or impaired GI absorption (gastrectomy, short bowel, patient on TNA or NPO).  If you have questions about this conversion, please contact the Pharmacy Department  _0   [phone] )  [facility] _1   [phone] )  [facility] _2   [phone] )  [facility] _3   [phone] )  [facility] _4   [phone] )  [place], PharmD, BCPS Clinical Pharmacist 07/31/2020 11:27 AM

## 2020-07-31 NOTE — Progress Notes (Signed)
PHARMACIST - PHYSICIAN COMMUNICATION DR:   Grandville Silos CONCERNING: Antibiotic IV to Oral Route Change Policy  RECOMMENDATION: This patient is receiving metronidazole by the intravenous route.  Based on criteria approved by the Pharmacy and Therapeutics Committee, the antibiotic(s) is/are being converted to the equivalent oral dose form(s).   DESCRIPTION: These criteria include:  Patient being treated for a respiratory tract infection, urinary tract infection, cellulitis or clostridium difficile associated diarrhea if on metronidazole  The patient is not neutropenic and does not exhibit a GI malabsorption state  The patient is eating (either orally or via tube) and/or has been taking other orally administered medications for a least 24 hours  The patient is improving clinically and has a Tmax < 100.5  If you have questions about this conversion, please contact the [place], PharmD, BCPS Clinical Pharmacist 07/31/2020 12:10 PM

## 2020-07-31 NOTE — Progress Notes (Signed)
Patient transported off unit for MRI procedure

## 2020-07-31 NOTE — Progress Notes (Addendum)
Neurology Progress Note   S:// Patient seen and examined this morning. Completed a modified barium swallow evaluation-has moderate oral phase dysphagia and mild pharyngeal phase dysphagia.  At this time, recommendation is for dysphagia 2 diet via cup-no straws, as well as medicine with thin liquids. She reports of a mild headache but other than that no complaints.   O:// Current vital signs: BP (!) 157/62 (BP Location: Left Arm)   Pulse (!) 54   Temp 98.5 F (36.9 C) (Oral)   Resp 16   SpO2 95%  Vital signs in last 24 hours: Temp:  [97.7 F (36.5 C)-98.6 F (37 C)] 98.5 F (36.9 C) (01/25 0602) Pulse Rate:  [53-60] 54 (01/25 0602) Resp:  [16-27] 16 (01/25 0602) BP: (144-164)/(45-71) 157/62 (01/25 0602) SpO2:  [94 %-98 %] 95 % (01/25 0602) General: Awake alert in no distress HEENT: Cephalic atraumatic Lungs: Clear Prevascular regular rhythm Abdomen soft nondistended nontender Neurological exam She is awake alert oriented x3 I actually communicated with her without the interpreter today with my mask down that she was able to lip read and answer all questions extremely appropriately. Did not appear she has any sort of aphasia-given the limitation of decreased verbal output secondary to her deafness. She was able to mouth the appropriate words for naming the objects and was able to repeat. She follows all commands Cranial nerves: Pupils equal round reactive light, extract movements intact, visual fields full, face appears symmetric, facial sensation intact, she is deaf bilaterally, tongue and palate midline. Motor exam: Antigravity 5/5 in all fours Sensation intact light touch without any evidence of extinction on double 70 stimulation Coordination with no evidence of dysmetria.  Medications  Current Facility-Administered Medications:  .  0.9 %  sodium chloride infusion, , Intravenous, PRN, Eugenie Filler, MD, Last Rate: 10 mL/hr at 07/31/20 0308, Infusion Verify at  07/31/20 0308 .  budesonide (PULMICORT) nebulizer solution 0.5 mg, 0.5 mg, Nebulization, BID, Eugenie Filler, MD, 0.5 mg at 07/31/20 0739 .  cefTRIAXone (ROCEPHIN) 1 g in sodium chloride 0.9 % 100 mL IVPB, 1 g, Intravenous, Q24H, Agbata, Tochukwu, MD, Stopped at 07/30/20 1732 .  citalopram (CELEXA) tablet 10 mg, 10 mg, Oral, Daily, Agbata, Tochukwu, MD, 10 mg at 07/31/20 1112 .  clopidogrel (PLAVIX) tablet 75 mg, 75 mg, Oral, Daily, Agbata, Tochukwu, MD, 75 mg at 07/31/20 1112 .  enoxaparin (LOVENOX) injection 40 mg, 40 mg, Subcutaneous, Q24H, Agbata, Tochukwu, MD, 40 mg at 07/30/20 1830 .  fluticasone (FLONASE) 50 MCG/ACT nasal spray 2 spray, 2 spray, Each Nare, Daily, Eugenie Filler, MD .  furosemide (LASIX) injection 40 mg, 40 mg, Intravenous, Daily, Eugenie Filler, MD, 40 mg at 07/31/20 1113 .  guaiFENesin (MUCINEX) 12 hr tablet 1,200 mg, 1,200 mg, Oral, BID, Eugenie Filler, MD, 1,200 mg at 07/31/20 1112 .  HYDROcodone-acetaminophen (NORCO/VICODIN) 5-325 MG per tablet 1 tablet, 1 tablet, Oral, Q4H PRN, Agbata, Tochukwu, MD, 1 tablet at 07/30/20 1507 .  lacosamide (VIMPAT) 150 mg in sodium chloride 0.9 % 25 mL IVPB, 150 mg, Intravenous, Q12H, Eugenie Filler, MD, Stopped at 07/31/20 0009 .  loratadine (CLARITIN) tablet 10 mg, 10 mg, Oral, Daily, Eugenie Filler, MD, 10 mg at 07/31/20 1114 .  LORazepam (ATIVAN) injection 1 mg, 1 mg, Intravenous, Q4H PRN, Eugenie Filler, MD .  losartan (COZAAR) tablet 100 mg, 100 mg, Oral, Daily, Agbata, Tochukwu, MD, 100 mg at 07/31/20 1112 .  magnesium sulfate IVPB 4 g 100 mL, 4  g, Intravenous, Once, Eugenie Filler, MD, Last Rate: 50 mL/hr at 07/31/20 1110, 4 g at 07/31/20 1110 .  metoprolol tartrate (LOPRESSOR) injection 5 mg, 5 mg, Intravenous, Q8H, Eugenie Filler, MD, 5 mg at 07/31/20 1113 .  metroNIDAZOLE (FLAGYL) IVPB 500 mg, 500 mg, Intravenous, Q8H, Agbata, Tochukwu, MD, Last Rate: 100 mL/hr at 07/31/20 1109, 500 mg at  07/31/20 1109 .  montelukast (SINGULAIR) tablet 10 mg, 10 mg, Oral, QHS, Agbata, Tochukwu, MD, 10 mg at 07/30/20 2245 .  nicotine (NICODERM CQ - dosed in mg/24 hours) patch 14 mg, 14 mg, Transdermal, Daily, Agbata, Tochukwu, MD, 14 mg at 07/31/20 1112 .  pantoprazole (PROTONIX) injection 40 mg, 40 mg, Intravenous, Q24H, Eugenie Filler, MD, 40 mg at 07/31/20 1113 .  potassium chloride (KLOR-CON) packet 40 mEq, 40 mEq, Oral, Q4H, Eugenie Filler, MD, 40 mEq at 07/31/20 1111 .  rosuvastatin (CRESTOR) tablet 10 mg, 10 mg, Oral, QPM, Agbata, Tochukwu, MD, 10 mg at 07/30/20 2040 Labs CBC    Component Value Date/Time   WBC 4.7 07/31/2020 0533   RBC 4.77 07/31/2020 0533   HGB 13.1 07/31/2020 0533   HGB 15.3 11/05/2013 1138   HCT 40.1 07/31/2020 0533   HCT 44.8 11/05/2013 1138   PLT 170 07/31/2020 0533   PLT 214 11/05/2013 1138   MCV 84.1 07/31/2020 0533   MCV 85 11/05/2013 1138   MCH 27.5 07/31/2020 0533   MCHC 32.7 07/31/2020 0533   RDW 12.9 07/31/2020 0533   RDW 13.3 11/05/2013 1138   LYMPHSABS 2.3 07/29/2020 1345   LYMPHSABS 1.4 04/24/2013 0614   MONOABS 0.5 07/29/2020 1345   MONOABS 0.2 04/24/2013 0614   EOSABS 0.1 07/29/2020 1345   EOSABS 0.0 04/24/2013 0614   BASOSABS 0.0 07/29/2020 1345   BASOSABS 0.0 04/24/2013 0614    CMP     Component Value Date/Time   NA 142 07/31/2020 0533   NA 139 11/05/2013 1138   K 2.9 (L) 07/31/2020 0533   K 3.3 (L) 11/05/2013 1138   CL 103 07/31/2020 0533   CL 103 11/05/2013 1138   CO2 26 07/31/2020 0533   CO2 29 11/05/2013 1138   GLUCOSE 107 (H) 07/31/2020 0533   GLUCOSE 219 (H) 11/05/2013 1138   BUN 10 07/31/2020 0533   BUN 9 11/05/2013 1138   CREATININE 0.48 07/31/2020 0533   CREATININE 0.35 (L) 11/05/2013 1138   CALCIUM 8.4 (L) 07/31/2020 0533   CALCIUM 8.9 11/05/2013 1138   PROT 7.0 07/29/2020 1345   PROT 7.0 11/05/2013 1138   ALBUMIN 3.6 07/29/2020 1345   ALBUMIN 3.4 11/05/2013 1138   AST 27 07/29/2020 1345   AST 48 (H)  11/05/2013 1138   ALT 16 07/29/2020 1345   ALT 35 11/05/2013 1138   ALKPHOS 108 07/29/2020 1345   ALKPHOS 145 (H) 11/05/2013 1138   BILITOT 0.6 07/29/2020 1345   BILITOT 0.3 11/05/2013 1138   GFRNONAA >60 07/31/2020 0533   GFRNONAA >60 11/05/2013 1138   GFRAA >60 12/24/2019 0514   GFRAA >60 11/05/2013 1138   EEG-normal  Imaging I have reviewed images in epic and the results pertinent to this consultation are: CT head no acute changes.  Assessment: 70 year old with significant past medical history of seizures as well as question of nonepileptic spells currently on Vimpat, COPD on home oxygen as needed during the night, nicotine dependence, hypertension, chronic diastolic CHF, nonalcoholic cirrhosis, deafness, brought in for evaluation of multiple seizures prior to 24 hours from presentation. No seizures  noted in the hospital. Report of multiple stressors due to family issues reported EEG normal No acute findings on CT head Clinically remained stable. Vimpat was increased during this admission. Review of systems reveal difficulty swallowing and repeated aspiration episodes-is getting swallow evaluation and a GI work-up is recommended.  Impression: Breakthrough seizures versus nonepileptic spells  Recommendations: Continue Vimpat 150 BID-ok to use PO Obtain brain MRI - r/o brainstem stroke due to worsening dysphagia. Appreciate GI eval for dysphagia The history that have obtained from the son does not appear convincing for conditions like myasthenia which might be causing swallowing issues so I would do the GI evaluation first and then maybe as an outpatient consider further evaluation for neurological cause for dysphagia if stroke is ruled out. Her shoulder pain is much better appreciate primary team management. For her headaches, I would continue her preventative medications from her home medication list for now and will defer to outpatient neurology for any changes in the  medications.  D/W Dr Grandville Silos on the floor  -- Amie Portland, MD Neurologist [facility] Pager: [phone]

## 2020-07-31 NOTE — Progress Notes (Addendum)
PROGRESS NOTE    [name]  GHW:[id number] DOB: [birth year] DOA: 07/29/2020 PCP: [facility]    Chief Complaint  Patient presents with  . Seizures    Brief Narrative:  Patient 70 year old female history of COPD on 2 L home O2 at night and during the day as needed, nicotine dependence, seizure disorder, hypertension, chronic diastolic dysfunction, nonalcoholic cirrhosis, deafness presented to the ED for evaluation for multiple witnessed seizures at home. Patient also noted to have seizures in the ED. Also patient noted to be hypoxic chest x-ray and clinical findings concerning for aspiration pneumonia. CT head done negative. Chest x-ray concerning for small patchy infiltrate in the left lung base. Neurology consulted. Speech therapy consulted.   Assessment & Plan:   Principal Problem:   Aspiration pneumonia ([place]) Active Problems:   Essential hypertension   Seizure ([place])   Depression   Hypokalemia   Seizure disorder (HCC)   Chronic diastolic heart failure (HCC)   Tobacco use   COPD (chronic obstructive pulmonary disease) (HCC)   Cirrhosis, non-alcoholic (HCC)  1 aspiration pneumonia/Dysphagia Patient presented noted to be hypoxic, presented with recurrent seizures, concern patient may have aspirated as a result of seizures. Patient seen by speech therapy and during evaluation patient was noted to have seizure-like activity and noted to be high risk for aspiration. Patient was made NPO.  Patient reassessed by speech and underwent modified barium swallow which per speech was concerning that during esophageal sweep, boluses were observed to be stacking and esophagus with retrograde movement observed.  SLP recommending evaluation by GI due to patient's recurrent pneumonias.  Patient placed on a dysphagia 2 diet with thin liquids which we will continue.  Continue empiric antibiotics of IV Rocephin, Flagyl.  Supportive care.  Follow.   2. Seizure  disorder Patient presented with recurrent seizure episodes. Patient noted to be on Vimpat 100 mg twice daily prior to admission. Patient received a dose of Ativan in the ER. It was noted per admitting physician that patient son stated triggers for seizures a low potassium. It is noted that patient compliant with medications. Neurology consulted and recommendation was to increase Vimpat to 150 mg twice daily. Due to patient's increased aspiration risk and n.p.o. status oral Vimpat was changed to IV.  Patient also placed on IV Ativan as needed for seizures greater than 5 minutes per neurology recommendations.  EEG obtained with no epileptiform discharges noted.  MRI brain ordered per neurology today.  IV Vimpat being changed back to oral Vimpat per neurology.  Appreciate neurology input and recommendations.    3. Chronic respiratory failure/COPD Felt acutely exacerbated secondary to aspiration pneumonia. Patient on 2 L oxygen chronically at bedtime and as needed during the day. Patient noted to be hypoxic on presentation to the ED. Continue scheduled duo nebs, Pulmicort to 0.5 mg twice daily. Continue antibiotics. Follow.  4. Chronic diastolic CHF Stable.  Patient was placed on IV Lasix and oral metoprolol changed to IV Lopressor as patient was n.p.o. yesterday.  Losartan also held.  Patient has been placed on a dysphagia 2 diet.  We will change IV Lasix back to home dose oral Lasix tomorrow.  Change IV Lopressor to oral Lopressor.  Resume ARB.  Follow.   5. Nicotine dependence Tobacco cessation. Continue nicotine patch.  6. Depression Patient currently on a diet and as such we will resume Celexa.   7. Hypokalemia/hypomagnesemia Secondary to diuretics.  Patient reporting to nurse that she can take oral potassium tablets low  potassium packets.  Will place on KCl 10 mEq IV every 1 hour x5 rounds.  Magnesium sulfate 4 g IV x1.  Repeat labs in the morning.  8.  Right shoulder pain Clinical improvement.   Plain films with no acute abnormalities.  Supportive care.  Pain management.     DVT prophylaxis: Lovenox Code Status: Full Family Communication: Updated patient.  Updated son [name] via telephone.  Disposition:   Status is: Inpatient    Dispo: The patient is from: Home. Lives with her son.              Anticipated d/c is to: To be determined              Anticipated d/c date is: To be determined              Patient currently on IV antibiotics for concern for aspiration pneumonia, on antiepileptic medications for seizures, not stable for discharge.        Consultants:   Neurology: Dr.Arora 07/30/2020  Gastroenterology pending  Procedures:   CT head 07/29/2020  Plain films of the right shoulder 07/29/2020  Chest x-ray 07/29/2020  Modified barium swallow 07/31/2020  EEG 07/30/2020  MRI brain pending  Antimicrobials:   IV Rocephin 07/29/2020>>>>  IV Flagyl 07/29/2020>>>>>   Subjective: Patient more alert today.  Able to read lips and answering questions appropriately.  Denies any chest pain.  States improvement with shortness of breath.  Denies any abdominal pain.  States she is hungry.  Just finished modified barium swallow and placed on a diet per SLP.  Overall states feeling much better.  No seizure like activity reported this morning per RN.   Objective: Vitals:   07/30/20 2023 07/31/20 0042 07/31/20 0602 07/31/20 1203  BP: (!) 144/45 (!) 164/50 (!) 157/62 (!) 181/98  Pulse: (!) 53 (!) 59 (!) 54 (!) 58  Resp: _0 Temp: 97.7 F (36.5 C) 98.6 F (37 C) 98.5 F (36.9 C) 98.4 F (36.9 C)  TempSrc: Oral Oral Oral Oral  SpO2: 96% 94% 95% 96%    Intake/Output Summary (Last 24 hours) at 07/31/2020 1230 Last data filed at 07/31/2020 1025 Gross per 24 hour  Intake 1063.96 ml  Output -  Net 1063.96 ml   There were no vitals filed for this visit.  Examination:  General exam: Sitting up.  Alert. Respiratory system: Decreased coarse  rhonchorous breath sounds.  No wheezing.  Normal respiratory effort. Cardiovascular system: Regular rate rhythm no murmurs rubs or gallops.  No JVD.  No lower extremity edema.  Gastrointestinal system: Abdomen is soft, nontender, nondistended, positive bowel sounds.  No rebound.  No guarding. Central nervous system: Alert and oriented. No focal neurological deficits. Extremities: Symmetric 5 x 5 power. Skin: No rashes, lesions or ulcers Psychiatry: Judgement and insight appear normal. Mood & affect appropriate.     Data Reviewed: I have personally reviewed following labs and imaging studies  CBC: Recent Labs  Lab 07/29/20 1345 07/30/20 0353 07/31/20 0533  WBC 7.6 6.8 4.7  NEUTROABS 4.7  --   --   HGB 14.4 12.9 13.1  HCT 43.7 39.2 40.1  MCV 82.6 83.2 84.1  PLT 213 176 211    Basic Metabolic Panel: Recent Labs  Lab 07/29/20 1345 07/30/20 0353 07/31/20 0533  NA 141 146* 142  K 3.0* 3.0* 2.9*  CL 102 103 103  CO2 _1 GLUCOSE 203* 145* 107*  BUN _2 CREATININE  0.52 0.52 0.48  CALCIUM 8.7* 8.5* 8.4*  MG  --  1.4* 1.3*    GFR: CrCl cannot be calculated (Unknown ideal weight.).  Liver Function Tests: Recent Labs  Lab 07/29/20 1345  AST 27  ALT 16  ALKPHOS 108  BILITOT 0.6  PROT 7.0  ALBUMIN 3.6    CBG: No results for input(s): GLUCAP in the last 168 hours.   Recent Results (from the past 240 hour(s))  SARS CORONAVIRUS 2 (TAT 6-24 HRS) Nasopharyngeal Nasopharyngeal Swab     Status: None   Collection Time: 07/29/20  2:37 PM   Specimen: Nasopharyngeal Swab  Result Value Ref Range Status   SARS Coronavirus 2 NEGATIVE NEGATIVE Final    Comment: (NOTE) SARS-CoV-2 target nucleic acids are NOT DETECTED.  The SARS-CoV-2 RNA is generally detectable in upper and lower respiratory specimens during the acute phase of infection. Negative results do not preclude SARS-CoV-2 infection, do not rule out co-infections with other pathogens, and should not be  used as the sole basis for treatment or other patient management decisions. Negative results must be combined with clinical observations, patient history, and epidemiological information. The expected result is Negative.  Fact Sheet for Patients: SugarRoll.be  Fact Sheet for Healthcare Providers: https://www.woods-mathews.com/  This test is not yet approved or cleared by the [place] FDA and  has been authorized for detection and/or diagnosis of SARS-CoV-2 by FDA under an Emergency Use Authorization (EUA). This EUA will remain  in effect (meaning this test can be used) for the duration of the COVID-19 declaration under Se ction 564(b)(1) of the Act, 21 U.S.C. section 360bbb-3(b)(1), unless the authorization is terminated or revoked sooner.  Performed at [facility], [place] [address], [place], [place] [address]          Radiology Studies: EEG  Result Date: 07/30/2020 Lora Havens, MD     07/30/2020  8:57 PM Patient Name: RONISHA HERRINGSHAW MRN: [id number] Epilepsy Attending: Lora Havens Referring Physician/Provider: Dr Amie [place] Date: 07/30/2020 Duration: 21.06 mins Patient history: 70 year old with significant past medical history of seizures as well as question of nonepileptic spells, currently on Vimpat, brought in for evaluation of multiple seizures in the last 24 hours. EEG to evaluate for seizure Level of alertness: Awake AEDs during EEG study: LCM Technical aspects: This EEG study was done with scalp electrodes positioned according to the 10-20 International system of electrode placement. Electrical activity was acquired at a sampling rate of 500Hz and reviewed with a high frequency filter of 70Hz and a low frequency filter of 1Hz. EEG data were recorded continuously and digitally stored. Description: The posterior dominant rhythm consists of 8 Hz activity of moderate voltage (25-35 uV) seen predominantly in posterior head  regions, symmetric and reactive to eye opening and eye closing. Hyperventilation and photic stimulation were not performed.   IMPRESSION: This study is within normal limits. No seizures or epileptiform discharges were seen throughout the recording. Lora Havens   DG Chest 1 View  Result Date: 07/29/2020 CLINICAL DATA:  Chest pain and seizure. EXAM: CHEST  1 VIEW COMPARISON:  August 05, 2019 FINDINGS: The heart size and mediastinal contours are within normal limits. Small patchy is identified in the left lung base. The right lung is clear. There is no pleural effusion or pulmonary edema. Right shoulder replacement is noted. IMPRESSION: Small patchy is identified in the left lung base, developing pneumonia or nodule is not excluded. Electronically Signed   By: Abelardo Diesel  M.D.   On: 07/29/2020 14:17   DG Shoulder Right  Result Date: 07/29/2020 CLINICAL DATA:  [place], right shoulder pain, history of right shoulder arthroplasty EXAM: RIGHT SHOULDER - 2+ VIEW COMPARISON:  12/21/2019 FINDINGS: Internal rotation, external rotation, and transscapular views of the right shoulder are obtained. Right shoulder arthroplasty is identified in expected position without signs of acute complication. No acute displaced fracture. Stable hypertrophic changes of the acromioclavicular joint. The right chest is clear. IMPRESSION: 1. Stable right shoulder arthroplasty. No evidence of acute fracture. Electronically Signed   By: Randa Ngo M.D.   On: 07/29/2020 20:50   CT Head Wo Contrast  Result Date: 07/29/2020 CLINICAL DATA:  Seizures today. EXAM: CT HEAD WITHOUT CONTRAST TECHNIQUE: Contiguous axial images were obtained from the base of the skull through the vertex without intravenous contrast. COMPARISON:  August 05, 2019 FINDINGS: Brain: No evidence of acute infarction, hemorrhage, hydrocephalus, extra-axial collection or mass lesion/mass effect. There is mild chronic diffuse atrophy. Vascular: No hyperdense vessel  is noted. Skull: Normal. Negative for fracture or focal lesion. Sinuses/Orbits: No acute finding. Other: None. IMPRESSION: 1. No focal acute intracranial abnormality identified. 2. Mild chronic diffuse atrophy. Electronically Signed   By: Abelardo Diesel M.D.   On: 07/29/2020 15:46   DG Swallowing Func-Speech Pathology  Result Date: 07/31/2020 Objective Swallowing Evaluation: Type of Study: MBS-Modified Barium Swallow Study  Patient Details Name: CORRISA GIBBY MRN: [id number] Date of Birth: 1950/08/13 Today's Date: 07/31/2020 Time: SLP Start Time (ACUTE ONLY): 0815 -SLP Stop Time (ACUTE ONLY): 0838 SLP Time Calculation (min) (ACUTE ONLY): 23 min Past Medical History: Past Medical History: Diagnosis Date . Asthma  . CHF (congestive heart failure) ([place])  . Cirrhosis, non-alcoholic ([place])  . COPD (chronic obstructive pulmonary disease) ([place])  . Deaf  . Depression  . GERD (gastroesophageal reflux disease)  . Heart murmur  . Hepatitis  . Hypertension  . Lymph node disorder   arm . Neuropathy  . On home oxygen therapy   hs . Orthopnea  . RLS (restless legs syndrome)  . Seizures ([place])  . Shortness of breath dyspnea  . Sleep apnea  . Stroke [facility])   tia Past Surgical History: Past Surgical History: Procedure Laterality Date . CATARACT EXTRACTION W/PHACO Right 11/23/2014  Procedure: CATARACT EXTRACTION PHACO AND INTRAOCULAR LENS PLACEMENT (IOC);  Surgeon: Lyla Glassing, MD;  Location: [facility] ORS;  Service: Ophthalmology;  Laterality: Right; . CATARACT EXTRACTION W/PHACO Left 12/14/2014  Procedure: CATARACT EXTRACTION PHACO AND INTRAOCULAR LENS PLACEMENT (IOC);  Surgeon: Lyla Glassing, MD;  Location: [facility] ORS;  Service: Ophthalmology;  Laterality: Left;  US:01:16.6 AP:15.8 CDE:12.14 . CESAREAN SECTION   . CHOLECYSTECTOMY   . REVERSE SHOULDER ARTHROPLASTY Right 12/21/2019  Procedure: REVERSE SHOULDER ARTHROPLASTY;  Surgeon: Lovell Sheehan, MD;  Location: [facility] ORS;  Service: Orthopedics;  Laterality: Right; . THUMB ARTHROSCOPY    . TONSILLECTOMY   . TYMPANOPLASTY    muliple HPI: JADENCE KINLAW is a 70 y.o. female with medical history significant for COPD on home oxygen 2 L as needed during the day and at night, nicotine dependence, seizure disorder, hypertension, chronic diastolic dysfunction CHF, nonalcoholic cirrhosis, deafness who presents to the ER by EMS for evaluation of multiple witnessed seizures at home. She was hypoxic in the field with room air pulse oximetry of 86% and has a loose wet cough.  Chest x-ray shows a left lower lobe infiltrate concerning for possible aspiration.  Subjective: alert, pleasant, conversant with combo of ASL and lip reading Assessment /  Plan / Recommendation CHL IP CLINICAL IMPRESSIONS 07/31/2020 Clinical Impression Pt presents with moderate oral phase dysphagia and mild pharyngeal phase dysphagia. When consuming puree, mechanical soft and barium tablet, pt's oral phase is lengthy as she demonstrates significantly decreased lingual manipulation of boluses, decreased posterior propulsion and decreased bolus cohesion. When consuming thin liquids, pt has mild to trace oral residue which results in multiple swallow per bolus. Pt's pharyngeal phase is largely within functional limits EXCEPT WHEN CONSUMING THIN LIQUIDS VIA STRAW. Pt with ASPIRATION OF THIN LIQUIDS WHEN USING STRAW. Although pt has an immediate cough, it doesn't expelled the aspirates. During esophageal sweep, boluses were observed to be stacking in her esophagus with retrograde movement observed. At this time, recommend dysphagia 2 diet with thin liquids VIA CUP - NO STRAWS - and medicine whole with thin liquids. Pt must adhere to strict reflux precautions and would benefit from a GI consult d/t history of recurrent pneumonias. SLP Visit Diagnosis Dysphagia, oral phase (R13.11) Attention and concentration deficit following -- Frontal lobe and executive function deficit following -- Impact on safety and function Moderate aspiration risk   CHL IP  TREATMENT RECOMMENDATION 07/31/2020 Treatment Recommendations Therapy as outlined in treatment plan below   Prognosis 07/31/2020 Prognosis for Safe Diet Advancement Good Barriers to Reach Goals (No Data) Barriers/Prognosis Comment -- CHL IP DIET RECOMMENDATION 07/31/2020 SLP Diet Recommendations Dysphagia 2 (Fine chop) solids;Thin liquid Liquid Administration via Cup;No straw Medication Administration Whole meds with liquid Compensations Minimize environmental distractions;Slow rate;Small sips/bites Postural Changes Seated upright at 90 degrees   CHL IP OTHER RECOMMENDATIONS 07/31/2020 Recommended Consults Consider GI evaluation Oral Care Recommendations Oral care BID Other Recommendations --   CHL IP FOLLOW UP RECOMMENDATIONS 07/31/2020 Follow up Recommendations (No Data)   CHL IP FREQUENCY AND DURATION 07/31/2020 Speech Therapy Frequency (ACUTE ONLY) min 2x/week Treatment Duration 2 weeks      CHL IP ORAL PHASE 07/31/2020 Oral Phase Impaired Oral - Pudding Teaspoon -- Oral - Pudding Cup -- Oral - Honey Teaspoon -- Oral - Honey Cup -- Oral - Nectar Teaspoon -- Oral - Nectar Cup -- Oral - Nectar Straw -- Oral - Thin Teaspoon NT Oral - Thin Cup Weak lingual manipulation;Decreased bolus cohesion Oral - Thin Straw Weak lingual manipulation;Decreased bolus cohesion Oral - Puree Weak lingual manipulation;Reduced posterior propulsion;Delayed oral transit;Decreased bolus cohesion Oral - Mech Soft Weak lingual manipulation;Reduced posterior propulsion;Delayed oral transit;Decreased bolus cohesion Oral - Regular NT Oral - Multi-Consistency NT Oral - Pill Weak lingual manipulation;Reduced posterior propulsion;Piecemeal swallowing;Decreased bolus cohesion;Delayed oral transit Oral Phase - Comment --  CHL IP PHARYNGEAL PHASE 07/31/2020 Pharyngeal Phase Impaired Pharyngeal- Pudding Teaspoon -- Pharyngeal -- Pharyngeal- Pudding Cup -- Pharyngeal -- Pharyngeal- Honey Teaspoon -- Pharyngeal -- Pharyngeal- Honey Cup -- Pharyngeal --  Pharyngeal- Nectar Teaspoon -- Pharyngeal -- Pharyngeal- Nectar Cup -- Pharyngeal -- Pharyngeal- Nectar Straw -- Pharyngeal -- Pharyngeal- Thin Teaspoon -- Pharyngeal -- Pharyngeal- Thin Cup WFL Pharyngeal -- Pharyngeal- Thin Straw Penetration/Aspiration during swallow;Moderate aspiration Pharyngeal Material enters airway, passes BELOW cords and not ejected out despite cough attempt by patient Pharyngeal- Puree WFL Pharyngeal -- Pharyngeal- Mechanical Soft WFL Pharyngeal -- Pharyngeal- Regular -- Pharyngeal -- Pharyngeal- Multi-consistency -- Pharyngeal -- Pharyngeal- Pill WFL Pharyngeal -- Pharyngeal Comment --  CHL IP CERVICAL ESOPHAGEAL PHASE 07/31/2020 Cervical Esophageal Phase WFL Pudding Teaspoon -- Pudding Cup -- Honey Teaspoon -- Honey Cup -- Nectar Teaspoon -- Nectar Cup -- Nectar Straw -- Thin Teaspoon -- Thin Cup -- Thin Straw -- Puree -- Mechanical Soft --  Regular -- Multi-consistency -- Pill -- Cervical Esophageal Comment -- Happi B. Rutherford Nail M.S., CCC-SLP, [place] Office [phone] Happi Rutherford Nail 07/31/2020, 9:28 AM                   Scheduled Meds: . budesonide (PULMICORT) nebulizer solution  0.5 mg Nebulization BID  . citalopram  10 mg Oral Daily  . clopidogrel  75 mg Oral Daily  . enoxaparin (LOVENOX) injection  40 mg Subcutaneous Q24H  . fluticasone  2 spray Each Nare Daily  . furosemide  40 mg Intravenous Daily  . guaiFENesin  1,200 mg Oral BID  . lacosamide  150 mg Oral BID  . loratadine  10 mg Oral Daily  . losartan  100 mg Oral Daily  . metoprolol tartrate  5 mg Intravenous Q8H  . metroNIDAZOLE  500 mg Oral Q8H  . montelukast  10 mg Oral QHS  . nicotine  14 mg Transdermal Daily  . pantoprazole (PROTONIX) IV  40 mg Intravenous Q24H  . rosuvastatin  10 mg Oral QPM   Continuous Infusions: . sodium chloride 10 mL/hr at 07/31/20 0308  . cefTRIAXone (ROCEPHIN)  IV Stopped (07/30/20 1732)  . magnesium sulfate bolus IVPB 4 g  (07/31/20 1110)  . potassium chloride       LOS: 2 days    Time spent: 40 minutes    Irine Seal, MD [facility]   To contact the attending provider between 7A-7P or the covering provider during after hours 7P-7A, please log into the web site [email or web address] and access using universal [place] password for that web site. If you do not have the password, please call the hospital operator.  07/31/2020, 12:30 PM

## 2020-07-31 NOTE — Consult Note (Addendum)
[name], MD [address], [place], [place], [place], [address] [address], [place], [place], [place], [address] Phone: [phone]  Fax: [phone]  Consultation  Referring Provider:     Dr. Grandville Silos Primary Care Physician:  [facility] Reason for Consultation:     Dysphagia  Date of Admission:  07/29/2020 Date of Consultation:  07/31/2020         HPI:   [name] is a 70 y.o. female with previous history of seizures, admitted due to multiple seizures in the last 24 hours with GI consulted for dysphagia.  EEG was within normal limits.  Patient is being followed by neurology.  Patient was evaluated by speech and swallow pathology and based on their assessment, GI evaluation was recommended.  She has been cleared for dysphagia diet.  Speech pathology notes "during esophageal sweep, boluses were observed to be stacking in her esophagus with retrograde movement observed."  Patient denies any previous upper endoscopy.  Patient reports solid food dysphagia.  Denies weight loss.  Past Medical History:  Diagnosis Date  . Asthma   . CHF (congestive heart failure) ([place])   . Cirrhosis, non-alcoholic ([place])   . COPD (chronic obstructive pulmonary disease) ([place])   . Deaf   . Depression   . GERD (gastroesophageal reflux disease)   . Heart murmur   . Hepatitis   . Hypertension   . Lymph node disorder    arm  . Neuropathy   . On home oxygen therapy    hs  . Orthopnea   . RLS (restless legs syndrome)   . Seizures ([place])   . Shortness of breath dyspnea   . Sleep apnea   . Stroke [facility])    tia    Past Surgical History:  Procedure Laterality Date  . CATARACT EXTRACTION W/PHACO Right 11/23/2014   Procedure: CATARACT EXTRACTION PHACO AND INTRAOCULAR LENS PLACEMENT (IOC);  Surgeon: Lyla Glassing, MD;  Location: [facility] ORS;  Service: Ophthalmology;  Laterality: Right;  . CATARACT EXTRACTION W/PHACO Left 12/14/2014   Procedure: CATARACT EXTRACTION  PHACO AND INTRAOCULAR LENS PLACEMENT (IOC);  Surgeon: Lyla Glassing, MD;  Location: [facility] ORS;  Service: Ophthalmology;  Laterality: Left;  US:01:16.6 AP:15.8 CDE:12.14  . CESAREAN SECTION    . CHOLECYSTECTOMY    . REVERSE SHOULDER ARTHROPLASTY Right 12/21/2019   Procedure: REVERSE SHOULDER ARTHROPLASTY;  Surgeon: Lovell Sheehan, MD;  Location: [facility] ORS;  Service: Orthopedics;  Laterality: Right;  . THUMB ARTHROSCOPY    . TONSILLECTOMY    . TYMPANOPLASTY     muliple    Prior to Admission medications   Medication Sig Start Date End Date Taking? Authorizing Provider  citalopram (CELEXA) 10 MG tablet Take 10 mg by mouth daily. 03/02/19  Yes [provider]  clopidogrel (PLAVIX) 75 MG tablet Take 1 tablet (75 mg total) by mouth daily. 07/17/16  Yes Vaughan Basta, MD  doxycycline (VIBRA-TABS) 100 MG tablet Take 1 tablet (100 mg total) by mouth 2 (two) times daily. 07/29/20  Yes Cuthriell, Charline Bills, PA-C  furosemide (LASIX) 40 MG tablet Take 40 mg by mouth daily. 07/22/18  Yes [provider]  Lacosamide 100 MG TABS Take 1 tablet (100 mg total) by mouth 2 (two) times daily. 12/24/19  Yes Fritzi Mandes, MD  losartan (COZAAR) 100 MG tablet Take 100 mg by mouth daily. 07/27/20  Yes [provider]  metoprolol succinate (TOPROL-XL) 25 MG 24 hr tablet Take 1 tablet (25 mg total) by mouth daily.  08/08/19  Yes Thornell Mule, MD  omeprazole (PRILOSEC) 20 MG capsule Take 20 mg by mouth daily.   Yes [provider]  potassium chloride (KLOR-CON) 10 MEQ tablet Take 2 tablets (20 mEq total) by mouth daily. 08/08/19 12/09/19 Yes Thornell Mule, MD  acetaminophen (TYLENOL) 325 MG tablet Take 2 tablets (650 mg total) by mouth every 6 (six) hours as needed for mild pain (or Fever >/= 101). Patient not taking: Reported on 12/07/2019 08/08/19   Thornell Mule, MD  albuterol (PROVENTIL HFA;VENTOLIN HFA) 108 (90 BASE) MCG/ACT inhaler Inhale 2 puffs into the lungs every 4 (four) hours  as needed for wheezing or shortness of breath.    [provider]  albuterol (PROVENTIL) (2.5 MG/3ML) 0.083% nebulizer solution Take 3 mLs (2.5 mg total) by nebulization every 4 (four) hours as needed for wheezing or shortness of breath. 09/23/17   Alisa Graff, FNP  docusate sodium (COLACE) 100 MG capsule Take 1 capsule (100 mg total) by mouth 2 (two) times daily as needed for mild constipation. Patient not taking: Reported on 12/09/2019 03/26/19   Nicholes Mango, MD  montelukast (SINGULAIR) 10 MG tablet Take 10 mg by mouth at bedtime.  03/18/16   [provider]  OXYGEN Inhale 2 L into the lungs at bedtime.    [provider]  rosuvastatin (CRESTOR) 10 MG tablet Take 10 mg by mouth daily. 04/21/17   [provider]    Family History  Problem Relation Age of Onset  . Lung cancer Mother   . CAD Father      Social History   Tobacco Use  . Smoking status: Current Every Day Smoker    Packs/day: 0.50    Years: 50.00    Pack years: 25.00    Types: Cigarettes    Start date: 03/01/1968  . Smokeless tobacco: Never Used  Vaping Use  . Vaping Use: Former  Substance Use Topics  . Alcohol use: No  . Drug use: No    Allergies as of 07/29/2020 - Review Complete 07/29/2020  Allergen Reaction Noted  . Aspirin Itching 11/21/2014  . Celebrex [celecoxib] Itching 11/21/2014  . Ciprofloxacin Itching 11/21/2014  . Codeine Itching 11/21/2014  . Fosphenytoin Itching 11/21/2014  . Levaquin [levofloxacin in d5w] Itching 11/21/2014  . Levofloxacin Itching 12/01/2013  . Lovastatin Itching 07/11/2016  . Penicillins  11/21/2014  . Pravastatin Itching 07/11/2016  . Sulfa antibiotics Itching 11/21/2014    Review of Systems:    All systems reviewed and negative except where noted in HPI.   Physical Exam:  Vital signs in last 24 hours: Vitals:   07/31/20 0602 07/31/20 1203 07/31/20 1811 07/31/20 2051  BP: (!) 157/62 (!) 181/98 (!) 139/50 (!) 168/67  Pulse: (!) 54  (!) 58 (!) 56 (!) 57  Resp: _0 Temp: 98.5 F (36.9 C) 98.4 F (36.9 C) 98 F (36.7 C) 97.8 F (36.6 C)  TempSrc: Oral Oral  Oral  SpO2: 95% 96% 93% 96%     General:   Pleasant, cooperative in NAD Head:  Normocephalic and atraumatic. Eyes:   No icterus.   Conjunctiva pink. PERRLA. Ears:  Normal auditory acuity. Neck:  Supple; no masses or thyroidomegaly Lungs: Respirations even and unlabored. Lungs clear to auscultation bilaterally.   No wheezes, crackles, or rhonchi.  Abdomen:  Soft, nondistended, nontender. Normal bowel sounds. No appreciable masses or hepatomegaly.  No rebound or guarding.  Neurologic:  Alert and oriented x3;  grossly normal neurologically. Skin:  Intact without significant lesions or rashes. Cervical Nodes:  No significant cervical adenopathy. Psych:  Alert and cooperative. Normal affect.  LAB RESULTS: Recent Labs    07/29/20 1345 07/30/20 0353 07/31/20 0533  WBC 7.6 6.8 4.7  HGB 14.4 12.9 13.1  HCT 43.7 39.2 40.1  PLT 213 176 170   BMET Recent Labs    07/29/20 1345 07/30/20 0353 07/31/20 0533  NA 141 146* 142  K 3.0* 3.0* 2.9*  CL 102 103 103  CO2 _0 GLUCOSE 203* 145* 107*  BUN _1 CREATININE 0.52 0.52 0.48  CALCIUM 8.7* 8.5* 8.4*   LFT Recent Labs    07/29/20 1345  PROT 7.0  ALBUMIN 3.6  AST 27  ALT 16  ALKPHOS 108  BILITOT 0.6   PT/INR No results for input(s): LABPROT, INR in the last 72 hours.  STUDIES: EEG  Result Date: 07/30/2020 Lora Havens, MD     07/30/2020  8:57 PM Patient Name: SHIRI HODAPP MRN: [id number] Epilepsy Attending: Lora Havens Referring Physician/Provider: Dr Amie Portland Date: 07/30/2020 Duration: 21.06 mins Patient history: 70 year old with significant past medical history of seizures as well as question of nonepileptic spells, currently on Vimpat, brought in for evaluation of multiple seizures in the last 24 hours. EEG to evaluate for seizure Level of alertness: Awake AEDs  during EEG study: LCM Technical aspects: This EEG study was done with scalp electrodes positioned according to the 10-20 International system of electrode placement. Electrical activity was acquired at a sampling rate of _2  and reviewed with a high frequency filter of _3  and a low frequency filter of _4 . EEG data were recorded continuously and digitally stored. Description: The posterior dominant rhythm consists of 8 Hz activity of moderate voltage (25-35 uV) seen predominantly in posterior head regions, symmetric and reactive to eye opening and eye closing. Hyperventilation and photic stimulation were not performed.   IMPRESSION: This study is within normal limits. No seizures or epileptiform discharges were seen throughout the recording. Lora Havens   DG Swallowing Func-Speech Pathology  Result Date: 07/31/2020 Objective Swallowing Evaluation: Type of Study: MBS-Modified Barium Swallow Study  Patient Details Name: [name] MRN: [id number] Date of Birth: 07-18-1950 Today's Date: 07/31/2020 Time: SLP Start Time (ACUTE ONLY): 0815 -SLP Stop Time (ACUTE ONLY): 0838 SLP Time Calculation (min) (ACUTE ONLY): 23 min Past Medical History: Past Medical History: Diagnosis Date . Asthma  . CHF (congestive heart failure) ([place])  . Cirrhosis, non-alcoholic ([place])  . COPD (chronic obstructive pulmonary disease) ([place])  . Deaf  . Depression  . GERD (gastroesophageal reflux disease)  . Heart murmur  . Hepatitis  . Hypertension  . Lymph node disorder   arm . Neuropathy  . On home oxygen therapy   hs . Orthopnea  . RLS (restless legs syndrome)  . Seizures ([place])  . Shortness of breath dyspnea  . Sleep apnea  . Stroke [facility])   tia Past Surgical History: Past Surgical History: Procedure Laterality Date . CATARACT EXTRACTION W/PHACO Right 11/23/2014  Procedure: CATARACT EXTRACTION PHACO AND INTRAOCULAR LENS PLACEMENT (IOC);  Surgeon: Lyla Glassing, MD;  Location: [facility] ORS;  Service: Ophthalmology;  Laterality: Right; .  CATARACT EXTRACTION W/PHACO Left 12/14/2014  Procedure: CATARACT EXTRACTION PHACO AND INTRAOCULAR LENS PLACEMENT (IOC);  Surgeon: Lyla Glassing, MD;  Location: [facility] ORS;  Service: Ophthalmology;  Laterality: Left;  US:01:16.6 AP:15.8 CDE:12.14 . CESAREAN SECTION   . CHOLECYSTECTOMY   . REVERSE SHOULDER ARTHROPLASTY Right 12/21/2019  Procedure: REVERSE SHOULDER ARTHROPLASTY;  Surgeon: Lovell Sheehan, MD;  Location: [facility] ORS;  Service: Orthopedics;  Laterality: Right; . THUMB ARTHROSCOPY   . TONSILLECTOMY   . TYMPANOPLASTY    muliple HPI: [name] is a 71 y.o. female with medical history significant for COPD on home oxygen 2 L as needed during the day and at night, nicotine dependence, seizure disorder, hypertension, chronic diastolic dysfunction CHF, nonalcoholic cirrhosis, deafness who presents to the ER by EMS for evaluation of multiple witnessed seizures at home. She was hypoxic in the field with room air pulse oximetry of 86% and has a loose wet cough.  Chest x-ray shows a left lower lobe infiltrate concerning for possible aspiration.  Subjective: alert, pleasant, conversant with combo of ASL and lip reading Assessment / Plan / Recommendation CHL IP CLINICAL IMPRESSIONS 07/31/2020 Clinical Impression Pt presents with moderate oral phase dysphagia and mild pharyngeal phase dysphagia. When consuming puree, mechanical soft and barium tablet, pt's oral phase is lengthy as she demonstrates significantly decreased lingual manipulation of boluses, decreased posterior propulsion and decreased bolus cohesion. When consuming thin liquids, pt has mild to trace oral residue which results in multiple swallow per bolus. Pt's pharyngeal phase is largely within functional limits EXCEPT WHEN CONSUMING THIN LIQUIDS VIA STRAW. Pt with ASPIRATION OF THIN LIQUIDS WHEN USING STRAW. Although pt has an immediate cough, it doesn't expelled the aspirates. During esophageal sweep, boluses were observed to be stacking in her esophagus  with retrograde movement observed. At this time, recommend dysphagia 2 diet with thin liquids VIA CUP - NO STRAWS - and medicine whole with thin liquids. Pt must adhere to strict reflux precautions and would benefit from a GI consult d/t history of recurrent pneumonias. SLP Visit Diagnosis Dysphagia, oral phase (R13.11) Attention and concentration deficit following -- Frontal lobe and executive function deficit following -- Impact on safety and function Moderate aspiration risk   CHL IP TREATMENT RECOMMENDATION 07/31/2020 Treatment Recommendations Therapy as outlined in treatment plan below   Prognosis 07/31/2020 Prognosis for Safe Diet Advancement Good Barriers to Reach Goals (No Data) Barriers/Prognosis Comment -- CHL IP DIET RECOMMENDATION 07/31/2020 SLP Diet Recommendations Dysphagia 2 (Fine chop) solids;Thin liquid Liquid Administration via Cup;No straw Medication Administration Whole meds with liquid Compensations Minimize environmental distractions;Slow rate;Small sips/bites Postural Changes Seated upright at 90 degrees   CHL IP OTHER RECOMMENDATIONS 07/31/2020 Recommended Consults Consider GI evaluation Oral Care Recommendations Oral care BID Other Recommendations --   CHL IP FOLLOW UP RECOMMENDATIONS 07/31/2020 Follow up Recommendations (No Data)   CHL IP FREQUENCY AND DURATION 07/31/2020 Speech Therapy Frequency (ACUTE ONLY) min 2x/week Treatment Duration 2 weeks      CHL IP ORAL PHASE 07/31/2020 Oral Phase Impaired Oral - Pudding Teaspoon -- Oral - Pudding Cup -- Oral - Honey Teaspoon -- Oral - Honey Cup -- Oral - Nectar Teaspoon -- Oral - Nectar Cup -- Oral - Nectar Straw -- Oral - Thin Teaspoon NT Oral - Thin Cup Weak lingual manipulation;Decreased bolus cohesion Oral - Thin Straw Weak lingual manipulation;Decreased bolus cohesion Oral - Puree Weak lingual manipulation;Reduced posterior propulsion;Delayed oral transit;Decreased bolus cohesion Oral - Mech Soft Weak lingual manipulation;Reduced posterior  propulsion;Delayed oral transit;Decreased bolus cohesion Oral - Regular NT Oral - Multi-Consistency NT Oral - Pill Weak lingual manipulation;Reduced posterior propulsion;Piecemeal swallowing;Decreased bolus cohesion;Delayed oral transit Oral Phase - Comment --  CHL IP PHARYNGEAL PHASE 07/31/2020 Pharyngeal Phase Impaired Pharyngeal- Pudding Teaspoon -- Pharyngeal -- Pharyngeal- Pudding Cup -- Pharyngeal -- Pharyngeal- Honey Teaspoon --  Pharyngeal -- Pharyngeal- Honey Cup -- Pharyngeal -- Pharyngeal- Nectar Teaspoon -- Pharyngeal -- Pharyngeal- Nectar Cup -- Pharyngeal -- Pharyngeal- Nectar Straw -- Pharyngeal -- Pharyngeal- Thin Teaspoon -- Pharyngeal -- Pharyngeal- Thin Cup WFL Pharyngeal -- Pharyngeal- Thin Straw Penetration/Aspiration during swallow;Moderate aspiration Pharyngeal Material enters airway, passes BELOW cords and not ejected out despite cough attempt by patient Pharyngeal- Puree WFL Pharyngeal -- Pharyngeal- Mechanical Soft WFL Pharyngeal -- Pharyngeal- Regular -- Pharyngeal -- Pharyngeal- Multi-consistency -- Pharyngeal -- Pharyngeal- Pill WFL Pharyngeal -- Pharyngeal Comment --  CHL IP CERVICAL ESOPHAGEAL PHASE 07/31/2020 Cervical Esophageal Phase WFL Pudding Teaspoon -- Pudding Cup -- Honey Teaspoon -- Honey Cup -- Nectar Teaspoon -- Nectar Cup -- Nectar Straw -- Thin Teaspoon -- Thin Cup -- Thin Straw -- Puree -- Mechanical Soft -- Regular -- Multi-consistency -- Pill -- Cervical Esophageal Comment -- Happi B. Rutherford Nail, M.S., CCC-SLP, [facility] Speech-Language Pathologist Rehabilitation Services Office [phone] Stormy Fabian 07/31/2020, 9:28 AM                 Impression / Plan:   KHALEA VENTURA is a 70 y.o. y/o female with dysphagia with speech pathology recommending further evaluation due to abnormal findings under exam, please see their note  Proceed with upper endoscopy for further evaluation of patient's dysphagia and rule out any underlying lesions given observed "stacking" in her  esophagus observed by speech pathology, and question of recurrent aspiration pneumonia events in the past  I have discussed alternative options, risks & benefits,  which include, but are not limited to, bleeding, infection, perforation,respiratory complication & drug reaction.  The patient agrees with this plan & written consent will be obtained.     Thank you for involving me in the care of this patient.      LOS: 2 days   Virgel Manifold, MD  07/31/2020, 9:09 PM

## 2020-07-31 NOTE — Progress Notes (Signed)
Modified Barium Swallow Progress Note  Patient Details  Name: [name] MRN: [id number] Date of Birth: [birth year]  Today's Date: 07/31/2020  Modified Barium Swallow completed.  Full report located under Chart Review in the Imaging Section.  Brief recommendations include the following:  Clinical Impression  Pt presents with moderate oral phase dysphagia and mild pharyngeal phase dysphagia. When consuming puree, mechanical soft and barium tablet, pt's oral phase is lengthy as she demonstrates significantly decreased lingual manipulation of boluses, decreased posterior propulsion and decreased bolus cohesion. When consuming thin liquids, pt has mild to trace oral residue which results in multiple swallow per bolus. Pt's pharyngeal phase is largely within functional limits EXCEPT WHEN CONSUMING THIN LIQUIDS VIA STRAW. Pt with ASPIRATION OF THIN LIQUIDS WHEN USING STRAW. Although pt has an immediate cough, it doesn't expelled the aspirates. During esophageal sweep, boluses were observed to be stacking in her esophagus with retrograde movement observed. At this time, recommend dysphagia 2 diet with thin liquids VIA CUP - NO STRAWS - and medicine whole with thin liquids. Pt must adhere to strict reflux precautions and would benefit from a GI consult d/t history of recurrent pneumonias.   Swallow Evaluation Recommendations   Recommended Consults: Consider GI evaluation   SLP Diet Recommendations: Dysphagia 2 (Fine chop) solids;Thin liquid   Liquid Administration via: Cup;No straw   Medication Administration: Whole meds with liquid   Supervision: Patient able to self feed   Compensations: Minimize environmental distractions;Slow rate;Small sips/bites   Postural Changes: Seated upright at 90 degrees   Oral Care Recommendations: Oral care BID      [name] M.S., CCC-SLP, [place] Pathologist Rehabilitation Services Office [phone]  [name]  [name] 07/31/2020,9:26 AM

## 2020-08-01 ENCOUNTER — Encounter
Admission: EM | Disposition: A | Discharge status: Home Health | Payer: Self-pay | Source: Home / Self Care | Attending: Internal Medicine

## 2020-08-01 DIAGNOSIS — G40909 Epilepsy, unspecified, not intractable, without status epilepticus: Secondary | ICD-10-CM

## 2020-08-01 DIAGNOSIS — J9601 Acute respiratory failure with hypoxia: Secondary | ICD-10-CM

## 2020-08-01 DIAGNOSIS — R1319 Other dysphagia: Secondary | ICD-10-CM | POA: Diagnosis not present

## 2020-08-01 LAB — CBC WITH DIFFERENTIAL/PLATELET
Abs Immature Granulocytes: 0.01 10*3/uL (ref 0.00–0.07)
Basophils Absolute: 0 10*3/uL (ref 0.0–0.1)
Basophils Relative: 1 %
Eosinophils Absolute: 0.1 10*3/uL (ref 0.0–0.5)
Eosinophils Relative: 2 %
HCT: 40.8 % (ref 36.0–46.0)
Hemoglobin: 13.7 g/dL (ref 12.0–15.0)
Immature Granulocytes: 0 %
Lymphocytes Relative: 37 %
Lymphs Abs: 2.3 10*3/uL (ref 0.7–4.0)
MCH: 27.9 pg (ref 26.0–34.0)
MCHC: 33.6 g/dL (ref 30.0–36.0)
MCV: 83.1 fL (ref 80.0–100.0)
Monocytes Absolute: 0.5 10*3/uL (ref 0.1–1.0)
Monocytes Relative: 8 %
Neutro Abs: 3.3 10*3/uL (ref 1.7–7.7)
Neutrophils Relative %: 52 %
Platelets: 182 10*3/uL (ref 150–400)
RBC: 4.91 MIL/uL (ref 3.87–5.11)
RDW: 12.9 % (ref 11.5–15.5)
WBC: 6.3 10*3/uL (ref 4.0–10.5)
nRBC: 0 % (ref 0.0–0.2)

## 2020-08-01 LAB — BASIC METABOLIC PANEL
Anion gap: 11 (ref 5–15)
BUN: 12 mg/dL (ref 8–23)
CO2: 25 mmol/L (ref 22–32)
Calcium: 8.9 mg/dL (ref 8.9–10.3)
Chloride: 106 mmol/L (ref 98–111)
Creatinine, Ser: 0.58 mg/dL (ref 0.44–1.00)
GFR, Estimated: >60 mL/min (ref >60)
Glucose, Bld: 115 mg/dL — ABNORMAL HIGH (ref 70–99)
Potassium: 3.2 mmol/L — ABNORMAL LOW (ref 3.5–5.1)
Sodium: 142 mmol/L (ref 135–145)

## 2020-08-01 LAB — MAGNESIUM: Magnesium: 2 mg/dL (ref 1.7–2.4)

## 2020-08-01 SURGERY — EGD (ESOPHAGOGASTRODUODENOSCOPY)
Anesthesia: General

## 2020-08-01 MED ORDER — NICOTINE 14 MG/24HR TD PT24: 14.0000 mg | MEDICATED_PATCH | Freq: Every day | TRANSDERMAL | 0 refills | Status: DC

## 2020-08-01 MED ORDER — SODIUM CHLORIDE 0.9 % IV SOLN: INTRAVENOUS | Status: DC

## 2020-08-01 MED ORDER — OMEPRAZOLE 20 MG PO CPDR: 40.0000 mg | DELAYED_RELEASE_CAPSULE | Freq: Every day | ORAL | 1 refills | Status: DC

## 2020-08-01 MED ORDER — LACOSAMIDE 150 MG PO TABS: 150.0000 mg | ORAL_TABLET | Freq: Two times a day (BID) | ORAL | 2 refills | Status: DC

## 2020-08-01 MED ORDER — GUAIFENESIN ER 600 MG PO TB12: 1200.0000 mg | ORAL_TABLET | Freq: Two times a day (BID) | ORAL | 0 refills | Status: DC

## 2020-08-01 MED ORDER — CLINDAMYCIN HCL 300 MG PO CAPS: 300.0000 mg | ORAL_CAPSULE | Freq: Three times a day (TID) | ORAL | 0 refills | Status: AC

## 2020-08-01 NOTE — Progress Notes (Signed)
PT Cancellation Note  Patient Details Name: [name] MRN: [id number] DOB: [birth year]   Cancelled Treatment:    Reason Eval/Treat Not Completed: Other (comment). Consult received and chart reviewed. Pt deaf, responds to written communication. Pt/RN reports she is at baseline level and has been indep in home environment. Pt reports she does have R shoulder pain. Encouraged to visit PT in next venue of care (outpatient PT) for further rehab as pt discharging this date. Pt in agreement. Will cancel current order, RN notified.   Dedra Matsuo 08/01/2020, 2:05 PM  Greggory Stallion, PT, DPT [phone]

## 2020-08-01 NOTE — Progress Notes (Signed)
MRI of the brain completed and reviewed. No acute changes-no stroke. Recommendations as in progress note from yesterday. Inpatient neurology will be available as needed. Please call with questions.  -- Amie Portland, MD Neurologist [facility] Pager: [phone]

## 2020-08-01 NOTE — Progress Notes (Signed)
Karen Antigua, MD [address] [place], [place], [place], [address] [address], [name], [place], [place], [address] Phone: [phone]  Fax: [phone]   Subjective: Pt has been on plavix. Is tolerating diet recommended by speech pathology  Objective: Exam: Vital signs in last 24 hours: Vitals:   07/31/20 2259 08/01/20 0445 08/01/20 0726 08/01/20 0900  BP: (!) 155/60 (!) 190/61 (!) 158/54   Pulse: (!) 55 61 61   Resp: _0 Temp: 97.8 F (36.6 C) 98.4 F (36.9 C) 98.3 F (36.8 C)   TempSrc: Oral Oral Oral   SpO2: 92% 94% 95% 95%   Weight change:   Intake/Output Summary (Last 24 hours) at 08/01/2020 1306 Last data filed at 07/31/2020 1700 Gross per 24 hour  Intake 390.93 ml  Output -  Net 390.93 ml    General: No acute distress, AAO x3 Abd: Soft, NT/ND, No HSM Skin: Warm, no rashes Neck: Supple, Trachea midline   Lab Results: Lab Results  Component Value Date   WBC 6.3 08/01/2020   HGB 13.7 08/01/2020   HCT 40.8 08/01/2020   MCV 83.1 08/01/2020   PLT 182 08/01/2020   Micro Results: Recent Results (from the past 240 hour(s))  SARS CORONAVIRUS 2 (TAT 6-24 HRS) Nasopharyngeal Nasopharyngeal Swab     Status: None   Collection Time: 07/29/20  2:37 PM   Specimen: Nasopharyngeal Swab  Result Value Ref Range Status   SARS Coronavirus 2 NEGATIVE NEGATIVE Final    Comment: (NOTE) SARS-CoV-2 target nucleic acids are NOT DETECTED.  The SARS-CoV-2 RNA is generally detectable in upper and lower respiratory specimens during the acute phase of infection. Negative results do not preclude SARS-CoV-2 infection, do not rule out co-infections with other pathogens, and should not be used as the sole basis for treatment or other patient management decisions. Negative results must be combined with clinical observations, patient history, and epidemiological information. The expected result is Negative.  Fact Sheet for  Patients: SugarRoll.be  Fact Sheet for Healthcare Providers: https://www.woods-mathews.com/  This test is not yet approved or cleared by the [place] FDA and  has been authorized for detection and/or diagnosis of SARS-CoV-2 by FDA under an Emergency Use Authorization (EUA). This EUA will remain  in effect (meaning this test can be used) for the duration of the COVID-19 declaration under Se ction 564(b)(1) of the Act, 21 U.S.C. section 360bbb-3(b)(1), unless the authorization is terminated or revoked sooner.  Performed at [facility], [place] [address], [place],  [address]    Studies/Results: EEG  Result Date: 07/30/2020 Karen Havens, MD     07/30/2020  8:57 PM Patient Name: [name] MRN: [id number] Epilepsy Attending: Lora Sherman Referring Physician/Provider: Dr Amie [place] Date: 07/30/2020 Duration: 21.06 mins Patient history: 70 year old with significant past medical history of seizures as well as question of nonepileptic spells, currently on Vimpat, brought in for evaluation of multiple seizures in the last 24 hours. EEG to evaluate for seizure Level of alertness: Awake AEDs during EEG study: LCM Technical aspects: This EEG study was done with scalp electrodes positioned according to the 10-20 International system of electrode placement. Electrical activity was acquired at a sampling rate of 500Hz and reviewed with a high frequency filter of 70Hz and a low frequency filter of 1Hz. EEG data were recorded continuously and digitally stored. Description: The posterior dominant rhythm consists of 8 Hz activity of moderate voltage (25-35 uV) seen predominantly in posterior head regions,  symmetric and reactive to eye opening and eye closing. Hyperventilation and photic stimulation were not performed.   IMPRESSION: This study is within normal limits. No seizures or epileptiform discharges were seen throughout the recording.  [name]   MR BRAIN WO CONTRAST  Result Date: 08/01/2020 CLINICAL DATA:  Initial evaluation for multiple seizures in past 24 hours, history of seizure. EXAM: MRI HEAD WITHOUT CONTRAST TECHNIQUE: Multiplanar, multiecho pulse sequences of the brain and surrounding structures were obtained without intravenous contrast. COMPARISON:  Prior CT from 07/29/2020. FINDINGS: Brain: Mild diffuse prominence of the CSF containing spaces compatible generalized age-related cerebral atrophy. Patchy and confluent T2/FLAIR hyperintensity within the periventricular and deep white matter both cerebral hemispheres most consistent with chronic small vessel ischemic disease. Overall, appearance is mild in nature. No abnormal foci of restricted diffusion to suggest acute or subacute ischemia or changes related to acute seizure. Gray-white matter differentiation well maintained. No encephalomalacia to suggest chronic cortical infarction. No foci of susceptibility artifact to suggest acute or chronic intracranial hemorrhage. No mass lesion, midline shift or mass effect. Ventricles normal size without hydrocephalus. No extra-axial fluid collection. Pituitary gland suprasellar region normal. Midline structures intact. No intrinsic temporal lobe abnormality. Vascular: Major intracranial vascular flow voids are well maintained. Skull and upper cervical spine: Craniocervical junction within normal limits. Bone marrow signal intensity normal. No scalp soft tissue abnormality. Sinuses/Orbits: Globes and orbital soft tissues demonstrate no acute finding. Patient status post bilateral ocular lens replacement. Mild scattered mucosal thickening noted throughout the paranasal sinuses. Trace left mastoid effusion, of doubtful significance. Inner ear structures grossly normal. Other: None. IMPRESSION: 1. No acute intracranial abnormality. 2. Mild age-related cerebral atrophy with chronic small vessel ischemic disease. Electronically Signed    By: Jeannine Boga M.D.   On: 08/01/2020 00:11   DG Swallowing Func-Speech Pathology  Result Date: 07/31/2020 Objective Swallowing Evaluation: Type of Study: MBS-Modified Barium Swallow Study  Patient Details Name: [name] MRN: [id number] Date of Birth: [birth year] Today's Date: 07/31/2020 Time: SLP Start Time (ACUTE ONLY): 0815 -SLP Stop Time (ACUTE ONLY): 0838 SLP Time Calculation (min) (ACUTE ONLY): 23 min Past Medical History: Past Medical History: Diagnosis Date . Asthma  . CHF (congestive heart failure) ([place])  . Cirrhosis, non-alcoholic ([place])  . COPD (chronic obstructive pulmonary disease) ([place])  . Deaf  . Depression  . GERD (gastroesophageal reflux disease)  . Heart murmur  . Hepatitis  . Hypertension  . Lymph node disorder   arm . Neuropathy  . On home oxygen therapy   hs . Orthopnea  . RLS (restless legs syndrome)  . Seizures ([place])  . Shortness of breath dyspnea  . Sleep apnea  . Stroke [facility])   tia Past Surgical History: Past Surgical History: Procedure Laterality Date . CATARACT EXTRACTION W/PHACO Right 11/23/2014  Procedure: CATARACT EXTRACTION PHACO AND INTRAOCULAR LENS PLACEMENT (IOC);  Surgeon: Lyla Glassing, MD;  Location: [facility] ORS;  Service: Ophthalmology;  Laterality: Right; . CATARACT EXTRACTION W/PHACO Left 12/14/2014  Procedure: CATARACT EXTRACTION PHACO AND INTRAOCULAR LENS PLACEMENT (IOC);  Surgeon: Lyla Glassing, MD;  Location: [facility] ORS;  Service: Ophthalmology;  Laterality: Left;  US:01:16.6 AP:15.8 CDE:12.14 . CESAREAN SECTION   . CHOLECYSTECTOMY   . REVERSE SHOULDER ARTHROPLASTY Right 12/21/2019  Procedure: REVERSE SHOULDER ARTHROPLASTY;  Surgeon: Lovell Sheehan, MD;  Location: [facility] ORS;  Service: Orthopedics;  Laterality: Right; . THUMB ARTHROSCOPY   . TONSILLECTOMY   . TYMPANOPLASTY    muliple HPI: [name] is a 70 y.o. female  with medical history significant for COPD on home oxygen 2 L as needed during the day and at night, nicotine dependence, seizure disorder,  hypertension, chronic diastolic dysfunction CHF, nonalcoholic cirrhosis, deafness who presents to the ER by EMS for evaluation of multiple witnessed seizures at home. She was hypoxic in the field with room air pulse oximetry of 86% and has a loose wet cough.  Chest x-ray shows a left lower lobe infiltrate concerning for possible aspiration.  Subjective: alert, pleasant, conversant with combo of ASL and lip reading Assessment / Plan / Recommendation CHL IP CLINICAL IMPRESSIONS 07/31/2020 Clinical Impression Pt presents with moderate oral phase dysphagia and mild pharyngeal phase dysphagia. When consuming puree, mechanical soft and barium tablet, pt's oral phase is lengthy as she demonstrates significantly decreased lingual manipulation of boluses, decreased posterior propulsion and decreased bolus cohesion. When consuming thin liquids, pt has mild to trace oral residue which results in multiple swallow per bolus. Pt's pharyngeal phase is largely within functional limits EXCEPT WHEN CONSUMING THIN LIQUIDS VIA STRAW. Pt with ASPIRATION OF THIN LIQUIDS WHEN USING STRAW. Although pt has an immediate cough, it doesn't expelled the aspirates. During esophageal sweep, boluses were observed to be stacking in her esophagus with retrograde movement observed. At this time, recommend dysphagia 2 diet with thin liquids VIA CUP - NO STRAWS - and medicine whole with thin liquids. Pt must adhere to strict reflux precautions and would benefit from a GI consult d/t history of recurrent pneumonias. SLP Visit Diagnosis Dysphagia, oral phase (R13.11) Attention and concentration deficit following -- Frontal lobe and executive function deficit following -- Impact on safety and function Moderate aspiration risk   CHL IP TREATMENT RECOMMENDATION 07/31/2020 Treatment Recommendations Therapy as outlined in treatment plan below   Prognosis 07/31/2020 Prognosis for Safe Diet Advancement Good Barriers to Reach Goals (No Data) Barriers/Prognosis  Comment -- CHL IP DIET RECOMMENDATION 07/31/2020 SLP Diet Recommendations Dysphagia 2 (Fine chop) solids;Thin liquid Liquid Administration via Cup;No straw Medication Administration Whole meds with liquid Compensations Minimize environmental distractions;Slow rate;Small sips/bites Postural Changes Seated upright at 90 degrees   CHL IP OTHER RECOMMENDATIONS 07/31/2020 Recommended Consults Consider GI evaluation Oral Care Recommendations Oral care BID Other Recommendations --   CHL IP FOLLOW UP RECOMMENDATIONS 07/31/2020 Follow up Recommendations (No Data)   CHL IP FREQUENCY AND DURATION 07/31/2020 Speech Therapy Frequency (ACUTE ONLY) min 2x/week Treatment Duration 2 weeks      CHL IP ORAL PHASE 07/31/2020 Oral Phase Impaired Oral - Pudding Teaspoon -- Oral - Pudding Cup -- Oral - Honey Teaspoon -- Oral - Honey Cup -- Oral - Nectar Teaspoon -- Oral - Nectar Cup -- Oral - Nectar Straw -- Oral - Thin Teaspoon NT Oral - Thin Cup Weak lingual manipulation;Decreased bolus cohesion Oral - Thin Straw Weak lingual manipulation;Decreased bolus cohesion Oral - Puree Weak lingual manipulation;Reduced posterior propulsion;Delayed oral transit;Decreased bolus cohesion Oral - Mech Soft Weak lingual manipulation;Reduced posterior propulsion;Delayed oral transit;Decreased bolus cohesion Oral - Regular NT Oral - Multi-Consistency NT Oral - Pill Weak lingual manipulation;Reduced posterior propulsion;Piecemeal swallowing;Decreased bolus cohesion;Delayed oral transit Oral Phase - Comment --  CHL IP PHARYNGEAL PHASE 07/31/2020 Pharyngeal Phase Impaired Pharyngeal- Pudding Teaspoon -- Pharyngeal -- Pharyngeal- Pudding Cup -- Pharyngeal -- Pharyngeal- Honey Teaspoon -- Pharyngeal -- Pharyngeal- Honey Cup -- Pharyngeal -- Pharyngeal- Nectar Teaspoon -- Pharyngeal -- Pharyngeal- Nectar Cup -- Pharyngeal -- Pharyngeal- Nectar Straw -- Pharyngeal -- Pharyngeal- Thin Teaspoon -- Pharyngeal -- Pharyngeal- Thin Cup WFL Pharyngeal -- Pharyngeal- Thin  Straw Penetration/Aspiration  during swallow;Moderate aspiration Pharyngeal Material enters airway, passes BELOW cords and not ejected out despite cough attempt by patient Pharyngeal- Puree WFL Pharyngeal -- Pharyngeal- Mechanical Soft WFL Pharyngeal -- Pharyngeal- Regular -- Pharyngeal -- Pharyngeal- Multi-consistency -- Pharyngeal -- Pharyngeal- Pill WFL Pharyngeal -- Pharyngeal Comment --  CHL IP CERVICAL ESOPHAGEAL PHASE 07/31/2020 Cervical Esophageal Phase WFL Pudding Teaspoon -- Pudding Cup -- Honey Teaspoon -- Honey Cup -- Nectar Teaspoon -- Nectar Cup -- Nectar Straw -- Thin Teaspoon -- Thin Cup -- Thin Straw -- Puree -- Mechanical Soft -- Regular -- Multi-consistency -- Pill -- Cervical Esophageal Comment -- Happi B. Rutherford Nail M.S., CCC-SLP, [place] Office [phone] Happi Rutherford Nail 07/31/2020, 9:28 AM              Medications:  Scheduled Meds: . budesonide (PULMICORT) nebulizer solution  0.5 mg Nebulization BID  . citalopram  10 mg Oral Daily  . clopidogrel  75 mg Oral Daily  . enoxaparin (LOVENOX) injection  40 mg Subcutaneous Q24H  . fluticasone  2 spray Each Nare Daily  . furosemide  40 mg Oral Daily  . guaiFENesin  1,200 mg Oral BID  . lacosamide  150 mg Oral BID  . loratadine  10 mg Oral Daily  . losartan  100 mg Oral Daily  . metoprolol succinate  25 mg Oral Daily  . metroNIDAZOLE  500 mg Oral Q8H  . montelukast  10 mg Oral QHS  . nicotine  14 mg Transdermal Daily  . pantoprazole (PROTONIX) IV  40 mg Intravenous Q24H  . rosuvastatin  10 mg Oral QPM   Continuous Infusions: . sodium chloride 10 mL/hr at 07/31/20 0308  . sodium chloride    . cefTRIAXone (ROCEPHIN)  IV 1 g (07/31/20 1754)   PRN Meds:.sodium chloride, acetaminophen, HYDROcodone-acetaminophen, LORazepam   Assessment: Principal Problem:   Aspiration pneumonia ([place]) Active Problems:   Essential hypertension   Seizure (HCC)   Depression   Hypokalemia   Seizure  disorder (HCC)   Chronic diastolic heart failure (HCC)   Tobacco use   COPD (chronic obstructive pulmonary disease) (HCC)   Cirrhosis, non-alcoholic (HCC)    Plan: Plavix will need to be held for her elective procedure Inpatient team, Dr. Karleen Hampshire does not plan on holding the pt as an inpatient for any other reasons.  Therefore elective endoscopy for dysphagia to be scheduled as an outpt Pt to follow up in GI clinic in 2-3 weeks and procedure can be scheduled at that time No indication for urgent procedure at this time as pt already has diagnosis of oropharyngeal dysphagia. This would also allow for further optimization with her new seizure medications  Pt agreeable with above plan as well   LOS: 3 days   Karen Antigua, MD 08/01/2020, 1:06 PM

## 2020-08-02 ENCOUNTER — Telehealth: Payer: Self-pay | Admitting: Gastroenterology

## 2020-08-02 ENCOUNTER — Telehealth: Payer: Self-pay

## 2020-08-02 NOTE — Discharge Summary (Addendum)
Physician Discharge Summary  [name] MLY:[id number] DOB: Jun 25, 1951 DOA: 07/29/2020  PCP: [facility] [place] date: 07/29/2020 Discharge date: 08/01/2020  Admitted From: [facility] Disposition:  HOME with Home health PT , SLP.   Recommendations for Outpatient Follow-up:  1. Follow up with PCP in 1-2 weeks 2. Please obtain BMP/CBC in one week 3. Please follow up with gastroenterology for EGD.     Discharge Condition:stable.  CODE STATUS: FULL CODE.  Diet recommendation: Heart Healthy  Brief/Interim Summary: Patient 70 year old female history of COPD on 2 L home O2 at night and during the day as needed, nicotine dependence, seizure disorder, hypertension, chronic diastolic dysfunction, nonalcoholic cirrhosis, deafness presented to the ED for evaluation for multiple witnessed seizures at home. Patient also noted to have seizures in the ED. Also patient noted to be hypoxic chest x-ray and clinical findings concerning for aspiration pneumonia. CT head done negative. Chest x-ray concerning for small patchy infiltrate in the left lung base. Neurology consulted. Speech therapy consulted  Discharge Diagnoses:  Principal Problem:   Aspiration pneumonia ([place]) Active Problems:   Essential hypertension   Seizure ([place])   Depression   Hypokalemia   Seizure disorder ([place])   Chronic diastolic heart failure (HCC)   Tobacco use   COPD (chronic obstructive pulmonary disease) (HCC)   Cirrhosis, non-alcoholic (HCC)  1 aspiration pneumonia/Dysphagia Patient presented noted to be hypoxic, presented with recurrent seizures, concern patient may have aspirated as a result of seizures. Patient seen by speech therapy and during evaluation patient was noted to have seizure-like activity and noted to be high risk for aspiration. Patient was made NPO.  Patient reassessed by speech and underwent modified barium swallow which per speech was concerning that during esophageal sweep, boluses  were observed to be stacking and esophagus with retrograde movement observed.  SLP recommending evaluation by GI due to patient's recurrent pneumonias.  Patient placed on a dysphagia 2 diet with thin liquids which we will continue. Patient was adamant about going home , and she was discharged on oral clindamycin to complete the course.  Post completion of the antibiotics she is supposed to follow up with gastroenterology for EGD for evaluation of dysphagia.  Tried to contact son to explain the details but he was unavailable.    2. Seizure disorder Patient presented with recurrent seizure episodes. Patient noted to be on Vimpat 100 mg twice daily prior to admission. Patient received a dose of Ativan in the ER. It was noted per admitting physician that patient son stated triggers for seizures a low potassium. It is noted that patient compliant with medications. Neurology consulted and recommendation was to increase Vimpat to 150 mg twice daily.   EEG obtained with no epileptiform discharges noted.  MRI brain ordered per neurology which was negative for acute stroke.  IV Vimpat being changed back to oral Vimpat per neurology.  Appreciate neurology input and recommendations.    3. Chronic respiratory failure/COPD Felt acutely exacerbated secondary to aspiration pneumonia. Patient on 2 L oxygen chronically at bedtime and as needed during the day.   4. Chronic diastolic CHF Pt appears euvolemic. Resume home meds.    5. Nicotine dependence Tobacco cessation. Continue nicotine patch.  6. Depression Patient currently on a diet and as such we will resume Celexa.   7. Hypokalemia/hypomagnesemia Secondary to diuretics.  Patient reporting to nurse that she can take oral potassium tablets low potassium packets.  replaved.   8.  Right shoulder pain Clinical improvement.  Plain  films with no acute abnormalities.  Supportive care.  Pain management.     Discharge Instructions  Discharge  Instructions    Diet - low sodium heart healthy   Complete by: As directed    Discharge instructions   Complete by: As directed    Please follow up with gastroenterology for outpatient scheduling of EGD.     Allergies as of 08/01/2020      Reactions   Aspirin Itching   Celebrex [celecoxib] Itching   itching   Ciprofloxacin Itching   Codeine Itching   Fosphenytoin Itching   Levaquin [levofloxacin In D5w] Itching   Levofloxacin Itching   Lovastatin Itching   Penicillins    Documentation indicates severe reaction Pt tolerated cephalosporin without adverse reaction 09/18   Pravastatin Itching   Sulfa Antibiotics Itching      Medication List    STOP taking these medications   docusate sodium 100 MG capsule Commonly known as: COLACE     TAKE these medications   acetaminophen 325 MG tablet Commonly known as: TYLENOL Take 2 tablets (650 mg total) by mouth every 6 (six) hours as needed for mild pain (or Fever >/= 101).   albuterol 108 (90 Base) MCG/ACT inhaler Commonly known as: VENTOLIN HFA Inhale 2 puffs into the lungs every 4 (four) hours as needed for wheezing or shortness of breath.   albuterol (2.5 MG/3ML) 0.083% nebulizer solution Commonly known as: PROVENTIL Take 3 mLs (2.5 mg total) by nebulization every 4 (four) hours as needed for wheezing or shortness of breath.   citalopram 10 MG tablet Commonly known as: CELEXA Take 10 mg by mouth daily.   clindamycin 300 MG capsule Commonly known as: CLEOCIN Take 1 capsule (300 mg total) by mouth 3 (three) times daily for 7 days.   clopidogrel 75 MG tablet Commonly known as: PLAVIX Take 1 tablet (75 mg total) by mouth daily.   furosemide 40 MG tablet Commonly known as: LASIX Take 40 mg by mouth daily.   guaiFENesin 600 MG 12 hr tablet Commonly known as: MUCINEX Take 2 tablets (1,200 mg total) by mouth 2 (two) times daily.      Lacosamide 150 MG Tabs Take 1 tablet (150 mg total) by mouth 2 (two) times daily.    losartan 100 MG tablet Commonly known as: COZAAR Take 100 mg by mouth daily.   metoprolol succinate 25 MG 24 hr tablet Commonly known as: TOPROL-XL Take 1 tablet (25 mg total) by mouth daily.   montelukast 10 MG tablet Commonly known as: SINGULAIR Take 10 mg by mouth at bedtime.   nicotine 14 mg/24hr patch Commonly known as: NICODERM CQ - dosed in mg/24 hours Place 1 patch (14 mg total) onto the skin daily.   omeprazole 20 MG capsule Commonly known as: PRILOSEC Take 2 capsules (40 mg total) by mouth daily. What changed: how much to take   OXYGEN Inhale 2 L into the lungs at bedtime.   potassium chloride 10 MEQ tablet Commonly known as: KLOR-CON Take 2 tablets (20 mEq total) by mouth daily.   rosuvastatin 10 MG tablet Commonly known as: CRESTOR Take 10 mg by mouth daily.       [place], [facility].   Why: As needed Contact information: [address] [place] [address] [phone]              Allergies  Allergen Reactions  . Aspirin Itching  . Celebrex [Celecoxib] Itching    itching  .  Ciprofloxacin Itching  . Codeine Itching  . Fosphenytoin Itching  . Levaquin [Levofloxacin In D5w] Itching  . Levofloxacin Itching  . Lovastatin Itching  . Penicillins     Documentation indicates severe reaction  Pt tolerated cephalosporin without adverse reaction 09/18   . Pravastatin Itching  . Sulfa Antibiotics Itching    Consultations:  Gastroenterology.    Procedures/Studies: EEG  Result Date: 07/30/2020 Lora Havens, MD     07/30/2020  8:57 PM Patient Name: [name] MRN: [id number] Epilepsy Attending: Lora Havens Referring Physician/Provider: Dr Amie [place] Date: 07/30/2020 Duration: 21.06 mins Patient history: 70 year old with significant past medical history of seizures as well as question of nonepileptic spells, currently on Vimpat, brought in for evaluation of multiple seizures in the last  24 hours. EEG to evaluate for seizure Level of alertness: Awake AEDs during EEG study: LCM Technical aspects: This EEG study was done with scalp electrodes positioned according to the 10-20 International system of electrode placement. Electrical activity was acquired at a sampling rate of _0  and reviewed with a high frequency filter of _1  and a low frequency filter of _2 . EEG data were recorded continuously and digitally stored. Description: The posterior dominant rhythm consists of 8 Hz activity of moderate voltage (25-35 uV) seen predominantly in posterior head regions, symmetric and reactive to eye opening and eye closing. Hyperventilation and photic stimulation were not performed.   IMPRESSION: This study is within normal limits. No seizures or epileptiform discharges were seen throughout the recording. Lora Havens   DG Chest 1 View  Result Date: 07/29/2020 CLINICAL DATA:  Chest pain and seizure. EXAM: CHEST  1 VIEW COMPARISON:  August 05, 2019 FINDINGS: The heart size and mediastinal contours are within normal limits. Small patchy is identified in the left lung base. The right lung is clear. There is no pleural effusion or pulmonary edema. Right shoulder replacement is noted. IMPRESSION: Small patchy is identified in the left lung base, developing pneumonia or nodule is not excluded. Electronically Signed   By: Abelardo Diesel M.D.   On: 07/29/2020 14:17   DG Shoulder Right  Result Date: 07/29/2020 CLINICAL DATA:  [place], right shoulder pain, history of right shoulder arthroplasty EXAM: RIGHT SHOULDER - 2+ VIEW COMPARISON:  12/21/2019 FINDINGS: Internal rotation, external rotation, and transscapular views of the right shoulder are obtained. Right shoulder arthroplasty is identified in expected position without signs of acute complication. No acute displaced fracture. Stable hypertrophic changes of the acromioclavicular joint. The right chest is clear. IMPRESSION: 1. Stable right shoulder  arthroplasty. No evidence of acute fracture. Electronically Signed   By: Randa Ngo M.D.   On: 07/29/2020 20:50   CT Head Wo Contrast  Result Date: 07/29/2020 CLINICAL DATA:  Seizures today. EXAM: CT HEAD WITHOUT CONTRAST TECHNIQUE: Contiguous axial images were obtained from the base of the skull through the vertex without intravenous contrast. COMPARISON:  August 05, 2019 FINDINGS: Brain: No evidence of acute infarction, hemorrhage, hydrocephalus, extra-axial collection or mass lesion/mass effect. There is mild chronic diffuse atrophy. Vascular: No hyperdense vessel is noted. Skull: Normal. Negative for fracture or focal lesion. Sinuses/Orbits: No acute finding. Other: None. IMPRESSION: 1. No focal acute intracranial abnormality identified. 2. Mild chronic diffuse atrophy. Electronically Signed   By: Abelardo Diesel M.D.   On: 07/29/2020 15:46   MR BRAIN WO CONTRAST  Result Date: 08/01/2020 CLINICAL DATA:  Initial evaluation for multiple seizures in past 24 hours, history of seizure. EXAM: MRI HEAD WITHOUT CONTRAST TECHNIQUE:  Multiplanar, multiecho pulse sequences of the brain and surrounding structures were obtained without intravenous contrast. COMPARISON:  Prior CT from 07/29/2020. FINDINGS: Brain: Mild diffuse prominence of the CSF containing spaces compatible generalized age-related cerebral atrophy. Patchy and confluent T2/FLAIR hyperintensity within the periventricular and deep white matter both cerebral hemispheres most consistent with chronic small vessel ischemic disease. Overall, appearance is mild in nature. No abnormal foci of restricted diffusion to suggest acute or subacute ischemia or changes related to acute seizure. Gray-white matter differentiation well maintained. No encephalomalacia to suggest chronic cortical infarction. No foci of susceptibility artifact to suggest acute or chronic intracranial hemorrhage. No mass lesion, midline shift or mass effect. Ventricles normal size without  hydrocephalus. No extra-axial fluid collection. Pituitary gland suprasellar region normal. Midline structures intact. No intrinsic temporal lobe abnormality. Vascular: Major intracranial vascular flow voids are well maintained. Skull and upper cervical spine: Craniocervical junction within normal limits. Bone marrow signal intensity normal. No scalp soft tissue abnormality. Sinuses/Orbits: Globes and orbital soft tissues demonstrate no acute finding. Patient status post bilateral ocular lens replacement. Mild scattered mucosal thickening noted throughout the paranasal sinuses. Trace left mastoid effusion, of doubtful significance. Inner ear structures grossly normal. Other: None. IMPRESSION: 1. No acute intracranial abnormality. 2. Mild age-related cerebral atrophy with chronic small vessel ischemic disease. Electronically Signed   By: Jeannine Boga M.D.   On: 08/01/2020 00:11   DG Swallowing Func-Speech Pathology  Result Date: 07/31/2020 Objective Swallowing Evaluation: Type of Study: MBS-Modified Barium Swallow Study  Patient Details Name: GABRIELLIA REMPEL MRN: [id number] Date of Birth: May 21, 1951 Today's Date: 07/31/2020 Time: SLP Start Time (ACUTE ONLY): 0815 -SLP Stop Time (ACUTE ONLY): 0838 SLP Time Calculation (min) (ACUTE ONLY): 23 min Past Medical History: Past Medical History: Diagnosis Date . Asthma  . CHF (congestive heart failure) ([place])  . Cirrhosis, non-alcoholic ([place])  . COPD (chronic obstructive pulmonary disease) ([place])  . Deaf  . Depression  . GERD (gastroesophageal reflux disease)  . Heart murmur  . Hepatitis  . Hypertension  . Lymph node disorder   arm . Neuropathy  . On home oxygen therapy   hs . Orthopnea  . RLS (restless legs syndrome)  . Seizures ([place])  . Shortness of breath dyspnea  . Sleep apnea  . Stroke [facility])   tia Past Surgical History: Past Surgical History: Procedure Laterality Date . CATARACT EXTRACTION W/PHACO Right 11/23/2014  Procedure: CATARACT EXTRACTION PHACO AND  INTRAOCULAR LENS PLACEMENT (IOC);  Surgeon: Lyla Glassing, MD;  Location: [facility] ORS;  Service: Ophthalmology;  Laterality: Right; . CATARACT EXTRACTION W/PHACO Left 12/14/2014  Procedure: CATARACT EXTRACTION PHACO AND INTRAOCULAR LENS PLACEMENT (IOC);  Surgeon: Lyla Glassing, MD;  Location: [facility] ORS;  Service: Ophthalmology;  Laterality: Left;  US:01:16.6 AP:15.8 CDE:12.14 . CESAREAN SECTION   . CHOLECYSTECTOMY   . REVERSE SHOULDER ARTHROPLASTY Right 12/21/2019  Procedure: REVERSE SHOULDER ARTHROPLASTY;  Surgeon: Lovell Sheehan, MD;  Location: [facility] ORS;  Service: Orthopedics;  Laterality: Right; . THUMB ARTHROSCOPY   . TONSILLECTOMY   . TYMPANOPLASTY    muliple HPI: AALEEYAH BIAS is a 70 y.o. female with medical history significant for COPD on home oxygen 2 L as needed during the day and at night, nicotine dependence, seizure disorder, hypertension, chronic diastolic dysfunction CHF, nonalcoholic cirrhosis, deafness who presents to the ER by EMS for evaluation of multiple witnessed seizures at home. She was hypoxic in the field with room air pulse oximetry of 86% and has a loose wet cough.  Chest x-ray shows  a left lower lobe infiltrate concerning for possible aspiration.  Subjective: alert, pleasant, conversant with combo of ASL and lip reading Assessment / Plan / Recommendation CHL IP CLINICAL IMPRESSIONS 07/31/2020 Clinical Impression Pt presents with moderate oral phase dysphagia and mild pharyngeal phase dysphagia. When consuming puree, mechanical soft and barium tablet, pt's oral phase is lengthy as she demonstrates significantly decreased lingual manipulation of boluses, decreased posterior propulsion and decreased bolus cohesion. When consuming thin liquids, pt has mild to trace oral residue which results in multiple swallow per bolus. Pt's pharyngeal phase is largely within functional limits EXCEPT WHEN CONSUMING THIN LIQUIDS VIA STRAW. Pt with ASPIRATION OF THIN LIQUIDS WHEN USING STRAW. Although pt has  an immediate cough, it doesn't expelled the aspirates. During esophageal sweep, boluses were observed to be stacking in her esophagus with retrograde movement observed. At this time, recommend dysphagia 2 diet with thin liquids VIA CUP - NO STRAWS - and medicine whole with thin liquids. Pt must adhere to strict reflux precautions and would benefit from a GI consult d/t history of recurrent pneumonias. SLP Visit Diagnosis Dysphagia, oral phase (R13.11) Attention and concentration deficit following -- Frontal lobe and executive function deficit following -- Impact on safety and function Moderate aspiration risk   CHL IP TREATMENT RECOMMENDATION 07/31/2020 Treatment Recommendations Therapy as outlined in treatment plan below   Prognosis 07/31/2020 Prognosis for Safe Diet Advancement Good Barriers to Reach Goals (No Data) Barriers/Prognosis Comment -- CHL IP DIET RECOMMENDATION 07/31/2020 SLP Diet Recommendations Dysphagia 2 (Fine chop) solids;Thin liquid Liquid Administration via Cup;No straw Medication Administration Whole meds with liquid Compensations Minimize environmental distractions;Slow rate;Small sips/bites Postural Changes Seated upright at 90 degrees   CHL IP OTHER RECOMMENDATIONS 07/31/2020 Recommended Consults Consider GI evaluation Oral Care Recommendations Oral care BID Other Recommendations --   CHL IP FOLLOW UP RECOMMENDATIONS 07/31/2020 Follow up Recommendations (No Data)   CHL IP FREQUENCY AND DURATION 07/31/2020 Speech Therapy Frequency (ACUTE ONLY) min 2x/week Treatment Duration 2 weeks      CHL IP ORAL PHASE 07/31/2020 Oral Phase Impaired Oral - Pudding Teaspoon -- Oral - Pudding Cup -- Oral - Honey Teaspoon -- Oral - Honey Cup -- Oral - Nectar Teaspoon -- Oral - Nectar Cup -- Oral - Nectar Straw -- Oral - Thin Teaspoon NT Oral - Thin Cup Weak lingual manipulation;Decreased bolus cohesion Oral - Thin Straw Weak lingual manipulation;Decreased bolus cohesion Oral - Puree Weak lingual manipulation;Reduced  posterior propulsion;Delayed oral transit;Decreased bolus cohesion Oral - Mech Soft Weak lingual manipulation;Reduced posterior propulsion;Delayed oral transit;Decreased bolus cohesion Oral - Regular NT Oral - Multi-Consistency NT Oral - Pill Weak lingual manipulation;Reduced posterior propulsion;Piecemeal swallowing;Decreased bolus cohesion;Delayed oral transit Oral Phase - Comment --  CHL IP PHARYNGEAL PHASE 07/31/2020 Pharyngeal Phase Impaired Pharyngeal- Pudding Teaspoon -- Pharyngeal -- Pharyngeal- Pudding Cup -- Pharyngeal -- Pharyngeal- Honey Teaspoon -- Pharyngeal -- Pharyngeal- Honey Cup -- Pharyngeal -- Pharyngeal- Nectar Teaspoon -- Pharyngeal -- Pharyngeal- Nectar Cup -- Pharyngeal -- Pharyngeal- Nectar Straw -- Pharyngeal -- Pharyngeal- Thin Teaspoon -- Pharyngeal -- Pharyngeal- Thin Cup WFL Pharyngeal -- Pharyngeal- Thin Straw Penetration/Aspiration during swallow;Moderate aspiration Pharyngeal Material enters airway, passes BELOW cords and not ejected out despite cough attempt by patient Pharyngeal- Puree WFL Pharyngeal -- Pharyngeal- Mechanical Soft WFL Pharyngeal -- Pharyngeal- Regular -- Pharyngeal -- Pharyngeal- Multi-consistency -- Pharyngeal -- Pharyngeal- Pill WFL Pharyngeal -- Pharyngeal Comment --  CHL IP CERVICAL ESOPHAGEAL PHASE 07/31/2020 Cervical Esophageal Phase WFL Pudding Teaspoon -- Pudding Cup -- Honey Teaspoon -- Honey Cup --  Nectar Teaspoon -- Nectar Cup -- Nectar Straw -- Thin Teaspoon -- Thin Cup -- Thin Straw -- Puree -- Mechanical Soft -- Regular -- Multi-consistency -- Pill -- Cervical Esophageal Comment -- Happi B. Rutherford Nail M.S., CCC-SLP, [place] Office [phone] Stormy Fabian 07/31/2020, 9:28 AM              Subjective:  Wants to go home and adamant.  Discharge Exam: Vitals:   08/01/20 0900 08/01/20 1520  BP:  129/63  Pulse:  (!) 58  Resp:  16  Temp:  98.4 F (36.9 C)  SpO2: 95% 93%   Vitals:   08/01/20 0445  08/01/20 0726 08/01/20 0900 08/01/20 1520  BP: (!) 190/61 (!) 158/54  129/63  Pulse: 61 61  (!) 58  Resp: _0 Temp: 98.4 F (36.9 C) 98.3 F (36.8 C)  98.4 F (36.9 C)  TempSrc: Oral Oral  Oral  SpO2: 94% 95% 95% 93%    General: Pt is alert, awake, not in acute distress Cardiovascular: RRR, S1/S2 +, no rubs, no gallops Respiratory: CTA bilaterally, no wheezing, no rhonchi Abdominal: Soft, NT, ND, bowel sounds + Extremities: no edema, no cyanosis    The results of significant diagnostics from this hospitalization (including imaging, microbiology, ancillary and laboratory) are listed below for reference.     Microbiology: Recent Results (from the past 240 hour(s))  SARS CORONAVIRUS 2 (TAT 6-24 HRS) Nasopharyngeal Nasopharyngeal Swab     Status: None   Collection Time: 07/29/20  2:37 PM   Specimen: Nasopharyngeal Swab  Result Value Ref Range Status   SARS Coronavirus 2 NEGATIVE NEGATIVE Final    Comment: (NOTE) SARS-CoV-2 target nucleic acids are NOT DETECTED.  The SARS-CoV-2 RNA is generally detectable in upper and lower respiratory specimens during the acute phase of infection. Negative results do not preclude SARS-CoV-2 infection, do not rule out co-infections with other pathogens, and should not be used as the sole basis for treatment or other patient management decisions. Negative results must be combined with clinical observations, patient history, and epidemiological information. The expected result is Negative.  Fact Sheet for Patients: SugarRoll.be  Fact Sheet for Healthcare Providers: https://www.woods-mathews.com/  This test is not yet approved or cleared by the [place] FDA and  has been authorized for detection and/or diagnosis of SARS-CoV-2 by FDA under an Emergency Use Authorization (EUA). This EUA will remain  in effect (meaning this test can be used) for the duration of the COVID-19 declaration under  Se ction 564(b)(1) of the Act, 21 U.S.C. section 360bbb-3(b)(1), unless the authorization is terminated or revoked sooner.  Performed at [facility], [place] [address], [place], [place] [address]      Labs: BNP (last 3 results) Recent Labs    08/05/19 0817 07/31/20 0533  BNP 56.0 321.2*   Basic Metabolic Panel: Recent Labs  Lab 07/29/20 1345 07/30/20 0353 07/31/20 0533 08/01/20 0508  NA 141 146* 142 142  K 3.0* 3.0* 2.9* 3.2*  CL 102 103 103 106  CO2 _1 GLUCOSE 203* 145* 107* 115*  BUN _2 CREATININE 0.52 0.52 0.48 0.58  CALCIUM 8.7* 8.5* 8.4* 8.9  MG  --  1.4* 1.3* 2.0   Liver Function Tests: Recent Labs  Lab 07/29/20 1345  AST 27  ALT 16  ALKPHOS 108  BILITOT 0.6  PROT 7.0  ALBUMIN 3.6   No results for input(s): LIPASE, AMYLASE in the last 168  hours. No results for input(s): AMMONIA in the last 168 hours. CBC: Recent Labs  Lab 07/29/20 1345 07/30/20 0353 07/31/20 0533 08/01/20 0508  WBC 7.6 6.8 4.7 6.3  NEUTROABS 4.7  --   --  3.3  HGB 14.4 12.9 13.1 13.7  HCT 43.7 39.2 40.1 40.8  MCV 82.6 83.2 84.1 83.1  PLT 213 176 170 182   Cardiac Enzymes: No results for input(s): CKTOTAL, CKMB, CKMBINDEX, TROPONINI in the last 168 hours. BNP: Invalid input(s): POCBNP CBG: No results for input(s): GLUCAP in the last 168 hours. D-Dimer No results for input(s): DDIMER in the last 72 hours. Hgb A1c No results for input(s): HGBA1C in the last 72 hours. Lipid Profile No results for input(s): CHOL, HDL, LDLCALC, TRIG, CHOLHDL, LDLDIRECT in the last 72 hours. Thyroid function studies No results for input(s): TSH, T4TOTAL, T3FREE, THYROIDAB in the last 72 hours.  Invalid input(s): FREET3 Anemia work up No results for input(s): VITAMINB12, FOLATE, FERRITIN, TIBC, IRON, RETICCTPCT in the last 72 hours. Urinalysis    Component Value Date/Time   COLORURINE YELLOW (A) 07/29/2020 2146   APPEARANCEUR HAZY (A) 07/29/2020 2146    APPEARANCEUR Clear 11/14/2011 1607   LABSPEC 1.018 07/29/2020 2146   LABSPEC 1.006 11/14/2011 1607   PHURINE 5.0 07/29/2020 2146   GLUCOSEU NEGATIVE 07/29/2020 2146   GLUCOSEU Negative 11/14/2011 1607   HGBUR NEGATIVE 07/29/2020 2146   BILIRUBINUR NEGATIVE 07/29/2020 2146   BILIRUBINUR Negative 11/14/2011 1607   KETONESUR NEGATIVE 07/29/2020 2146   PROTEINUR NEGATIVE 07/29/2020 2146   NITRITE NEGATIVE 07/29/2020 2146   LEUKOCYTESUR NEGATIVE 07/29/2020 2146   LEUKOCYTESUR Negative 11/14/2011 1607   Sepsis Labs Invalid input(s): PROCALCITONIN,  WBC,  LACTICIDVEN Microbiology Recent Results (from the past 240 hour(s))  SARS CORONAVIRUS 2 (TAT 6-24 HRS) Nasopharyngeal Nasopharyngeal Swab     Status: None   Collection Time: 07/29/20  2:37 PM   Specimen: Nasopharyngeal Swab  Result Value Ref Range Status   SARS Coronavirus 2 NEGATIVE NEGATIVE Final    Comment: (NOTE) SARS-CoV-2 target nucleic acids are NOT DETECTED.  The SARS-CoV-2 RNA is generally detectable in upper and lower respiratory specimens during the acute phase of infection. Negative results do not preclude SARS-CoV-2 infection, do not rule out co-infections with other pathogens, and should not be used as the sole basis for treatment or other patient management decisions. Negative results must be combined with clinical observations, patient history, and epidemiological information. The expected result is Negative.  Fact Sheet for Patients: SugarRoll.be  Fact Sheet for Healthcare Providers: https://www.woods-mathews.com/  This test is not yet approved or cleared by the [place] FDA and  has been authorized for detection and/or diagnosis of SARS-CoV-2 by FDA under an Emergency Use Authorization (EUA). This EUA will remain  in effect (meaning this test can be used) for the duration of the COVID-19 declaration under Se ction 564(b)(1) of the Act, 21 U.S.C. section  360bbb-3(b)(1), unless the authorization is terminated or revoked sooner.  Performed at [facility], [place] [address], [place], [place] [address]      Time coordinating discharge: 32 minutes.   SIGNED:   Hosie Poisson, MD  [facility]   Addendum:  As Patient is deaf, I wanted to speak with the son regarding the medications on discharge. I did not get a chance to speak with the son regarding the medications at the time of discharge,.  I called him on the phone and told him that we changed the Lacosamide from 100 mg  BID to 150 mg BID. She should stop the 100 mg tablets and continue with the new dose of 150 mg BID .  Discussed the rest of the new medications and the son reported understanding and agreeable.  Recommended outpatient follow up with Neurology and gastroenterology.   Hosie Poisson, MD

## 2020-08-02 NOTE — Telephone Encounter (Signed)
Called patient to let her know that Dr. Bonna Gains is wanting to see her for a hospital follow up. I left her a voicemail to return our call.

## 2020-08-02 NOTE — Telephone Encounter (Signed)
Virgel Manifold, MD  Dionisio David, Kaelei Wheeler L Please set up in person clinic appointment with me in 2-3 weeks for hospital follow up.    08/02/20 LVM to call our office for an appointment.

## 2020-08-09 ENCOUNTER — Telehealth: Payer: Self-pay | Admitting: Gastroenterology

## 2020-08-09 NOTE — Telephone Encounter (Signed)
Called patient to schedule follow up appt per Dr Bonna Gains, LM on voicemail to call back to schedule

## 2020-08-09 NOTE — Telephone Encounter (Signed)
-----  Message from Virgel Manifold, MD sent at 08/01/2020  4:02 PM EST -----  Please set up in person clinic appointment with me in 2-3 weeks for hospital follow up.

## 2020-08-16 NOTE — Telephone Encounter (Signed)
Called patient again and had to leave her a voicemail letting her know to call our office to schedule an appointment with Dr. Bonna Gains.

## 2020-09-14 ENCOUNTER — Ambulatory Visit
Admission: RE | Admit: 2020-09-14 | Discharge: 2020-09-14 | Disposition: A | Discharge status: Home / Self Care | Payer: Medicare Other | Source: Ambulatory Visit | Attending: Family Medicine | Admitting: Family Medicine

## 2020-09-14 ENCOUNTER — Other Ambulatory Visit: Payer: Self-pay

## 2020-09-14 DIAGNOSIS — Z1231 Encounter for screening mammogram for malignant neoplasm of breast: Secondary | ICD-10-CM | POA: Diagnosis present

## 2020-09-24 ENCOUNTER — Other Ambulatory Visit: Payer: Self-pay | Admitting: Family Medicine

## 2020-09-24 DIAGNOSIS — N6489 Other specified disorders of breast: Secondary | ICD-10-CM

## 2020-09-24 DIAGNOSIS — R928 Other abnormal and inconclusive findings on diagnostic imaging of breast: Secondary | ICD-10-CM

## 2020-10-04 ENCOUNTER — Other Ambulatory Visit: Payer: Self-pay

## 2020-10-04 ENCOUNTER — Other Ambulatory Visit
Admission: RE | Admit: 2020-10-04 | Discharge: 2020-10-04 | Disposition: A | Discharge status: Home / Self Care | Payer: Medicare Other | Source: Ambulatory Visit | Attending: Internal Medicine | Admitting: Internal Medicine

## 2020-10-04 DIAGNOSIS — Z20822 Contact with and (suspected) exposure to covid-19: Secondary | ICD-10-CM | POA: Diagnosis not present

## 2020-10-04 DIAGNOSIS — Z01812 Encounter for preprocedural laboratory examination: Secondary | ICD-10-CM | POA: Insufficient documentation

## 2020-10-04 LAB — SARS CORONAVIRUS 2 (TAT 6-24 HRS): SARS Coronavirus 2: NEGATIVE

## 2020-10-05 ENCOUNTER — Encounter: Payer: Self-pay | Admitting: Internal Medicine

## 2020-10-08 ENCOUNTER — Encounter
Admission: RE | Disposition: A | Discharge status: Home / Self Care | Payer: Self-pay | Source: Home / Self Care | Attending: Internal Medicine

## 2020-10-08 ENCOUNTER — Ambulatory Visit: Payer: Medicare HMO | Admitting: Anesthesiology

## 2020-10-08 ENCOUNTER — Ambulatory Visit
Admission: RE | Admit: 2020-10-08 | Discharge: 2020-10-08 | Disposition: A | Discharge status: Home / Self Care | Payer: Medicare HMO | Attending: Internal Medicine | Admitting: Internal Medicine

## 2020-10-08 ENCOUNTER — Other Ambulatory Visit: Payer: Self-pay

## 2020-10-08 ENCOUNTER — Encounter: Payer: Self-pay | Admitting: Internal Medicine

## 2020-10-08 DIAGNOSIS — K209 Esophagitis, unspecified without bleeding: Secondary | ICD-10-CM | POA: Insufficient documentation

## 2020-10-08 DIAGNOSIS — D123 Benign neoplasm of transverse colon: Secondary | ICD-10-CM | POA: Diagnosis not present

## 2020-10-08 DIAGNOSIS — K640 First degree hemorrhoids: Secondary | ICD-10-CM | POA: Diagnosis not present

## 2020-10-08 DIAGNOSIS — Z7902 Long term (current) use of antithrombotics/antiplatelets: Secondary | ICD-10-CM | POA: Diagnosis not present

## 2020-10-08 DIAGNOSIS — Z885 Allergy status to narcotic agent status: Secondary | ICD-10-CM | POA: Diagnosis not present

## 2020-10-08 DIAGNOSIS — Z888 Allergy status to other drugs, medicaments and biological substances status: Secondary | ICD-10-CM | POA: Diagnosis not present

## 2020-10-08 DIAGNOSIS — K449 Diaphragmatic hernia without obstruction or gangrene: Secondary | ICD-10-CM | POA: Diagnosis not present

## 2020-10-08 DIAGNOSIS — Z880 Allergy status to penicillin: Secondary | ICD-10-CM | POA: Insufficient documentation

## 2020-10-08 DIAGNOSIS — Z9981 Dependence on supplemental oxygen: Secondary | ICD-10-CM | POA: Diagnosis not present

## 2020-10-08 DIAGNOSIS — Z1211 Encounter for screening for malignant neoplasm of colon: Secondary | ICD-10-CM | POA: Diagnosis not present

## 2020-10-08 DIAGNOSIS — Z79899 Other long term (current) drug therapy: Secondary | ICD-10-CM | POA: Insufficient documentation

## 2020-10-08 DIAGNOSIS — F17200 Nicotine dependence, unspecified, uncomplicated: Secondary | ICD-10-CM | POA: Diagnosis not present

## 2020-10-08 DIAGNOSIS — R1313 Dysphagia, pharyngeal phase: Secondary | ICD-10-CM | POA: Insufficient documentation

## 2020-10-08 DIAGNOSIS — K3189 Other diseases of stomach and duodenum: Secondary | ICD-10-CM | POA: Insufficient documentation

## 2020-10-08 DIAGNOSIS — Z881 Allergy status to other antibiotic agents status: Secondary | ICD-10-CM | POA: Insufficient documentation

## 2020-10-08 DIAGNOSIS — Z882 Allergy status to sulfonamides status: Secondary | ICD-10-CM | POA: Diagnosis not present

## 2020-10-08 DIAGNOSIS — Z886 Allergy status to analgesic agent status: Secondary | ICD-10-CM | POA: Diagnosis not present

## 2020-10-08 HISTORY — DX: Rheumatoid arthritis, unspecified: M06.9

## 2020-10-08 HISTORY — DX: Personal history of other infectious and parasitic diseases: Z86.19

## 2020-10-08 HISTORY — PX: ESOPHAGOGASTRODUODENOSCOPY (EGD) WITH PROPOFOL: SHX5813

## 2020-10-08 HISTORY — DX: Irritable bowel syndrome, unspecified: K58.9

## 2020-10-08 HISTORY — DX: Personal history of other diseases of the circulatory system: Z86.79

## 2020-10-08 HISTORY — DX: Unspecified osteoarthritis, unspecified site: M19.90

## 2020-10-08 HISTORY — PX: COLONOSCOPY WITH PROPOFOL: SHX5780

## 2020-10-08 LAB — GLUCOSE, CAPILLARY: Glucose-Capillary: 135 mg/dL — ABNORMAL HIGH (ref 70–99)

## 2020-10-08 SURGERY — ESOPHAGOGASTRODUODENOSCOPY (EGD) WITH PROPOFOL
Anesthesia: General

## 2020-10-08 MED ORDER — GLYCOPYRROLATE 0.2 MG/ML IJ SOLN
INTRAMUSCULAR | Status: DC | PRN
  Administered 2020-10-08: .2 mg via INTRAVENOUS

## 2020-10-08 MED ORDER — SPOT INK MARKER SYRINGE KIT
PACK | SUBMUCOSAL | Status: DC | PRN
  Administered 2020-10-08: 2.5 mL via SUBMUCOSAL

## 2020-10-08 MED ORDER — EPHEDRINE SULFATE 50 MG/ML IJ SOLN
INTRAMUSCULAR | Status: DC | PRN
  Administered 2020-10-08: 10 mg via INTRAVENOUS

## 2020-10-08 MED ORDER — FENTANYL CITRATE (PF) 100 MCG/2ML IJ SOLN
INTRAMUSCULAR | Status: AC
  Filled 2020-10-08: qty 2

## 2020-10-08 MED ORDER — SODIUM CHLORIDE 0.9 % IV SOLN: INTRAVENOUS | Status: DC

## 2020-10-08 MED ORDER — PROPOFOL 10 MG/ML IV BOLUS
INTRAVENOUS | Status: DC | PRN
Start: 2020-10-08 — End: 2020-10-08
  Administered 2020-10-08: 20 mg via INTRAVENOUS
  Administered 2020-10-08: 10 mg via INTRAVENOUS
  Administered 2020-10-08: 20 mg via INTRAVENOUS
  Administered 2020-10-08: 60 mg via INTRAVENOUS

## 2020-10-08 MED ORDER — PROPOFOL 500 MG/50ML IV EMUL
INTRAVENOUS | Status: DC | PRN
  Administered 2020-10-08: 150 ug/kg/min via INTRAVENOUS

## 2020-10-08 MED ORDER — FENTANYL CITRATE (PF) 100 MCG/2ML IJ SOLN
INTRAMUSCULAR | Status: DC | PRN
  Administered 2020-10-08 (×2): 50 ug via INTRAVENOUS

## 2020-10-08 NOTE — Anesthesia Postprocedure Evaluation (Signed)
Anesthesia Post Note  Patient: [name]  Procedure(s) Performed: ESOPHAGOGASTRODUODENOSCOPY (EGD) WITH PROPOFOL (N/A ) COLONOSCOPY WITH PROPOFOL (N/A )  Patient location during evaluation: PACU Anesthesia Type: General Level of consciousness: awake and alert Pain management: pain level controlled Vital Signs Assessment: post-procedure vital signs reviewed and stable Respiratory status: spontaneous breathing, nonlabored ventilation, respiratory function stable and patient connected to nasal cannula oxygen Cardiovascular status: blood pressure returned to baseline and stable Postop Assessment: no apparent nausea or vomiting Anesthetic complications: no   No complications documented.   Last Vitals:  Vitals:   10/08/20 1521 10/08/20 1531  BP: (!) 173/87 (!) 173/71  Pulse:    Resp:  (!) 23  Temp:    SpO2: 92%     Last Pain:  Vitals:   10/08/20 1531  TempSrc:   PainSc: 0-No pain                 Precious Haws Kennis Wissmann

## 2020-10-08 NOTE — Op Note (Signed)
[facility] Gastroenterology Patient Name: [name] Procedure Date: 10/08/2020 2:10 PM MRN: [id number] Account #: [id number] Date of Birth: 1951/01/24 Admit Type: Outpatient Age: 70 Room: [facility] ENDO ROOM 2 Gender: Female Note Status: Finalized Procedure:             Colonoscopy Indications:           Screening for colorectal malignant neoplasm Providers:             Benay Pike. Alice Reichert MD, MD Referring MD:          No Local Md, MD (Referring MD) Medicines:             Propofol per Anesthesia Complications:         No immediate complications. Procedure:             Pre-Anesthesia Assessment:                        - The risks and benefits of the procedure and the                         sedation options and risks were discussed with the                         patient. All questions were answered and informed                         consent was obtained.                        - Patient identification and proposed procedure were                         verified prior to the procedure by the nurse. The                         procedure was verified in the procedure room.                        - ASA Grade Assessment: III - A patient with severe                         systemic disease.                        - After reviewing the risks and benefits, the patient                         was deemed in satisfactory condition to undergo the                         procedure.                        After obtaining informed consent, the colonoscope was                         passed under direct vision. Throughout the procedure,                         the patient's blood pressure,  pulse, and oxygen                         saturations were monitored continuously. The                         Colonoscope was introduced through the anus and                         advanced to the the cecum, identified by appendiceal                         orifice and ileocecal valve. The  colonoscopy was                         performed without difficulty. The patient tolerated                         the procedure well. The quality of the bowel                         preparation was good. The ileocecal valve, appendiceal                         orifice, and rectum were photographed. Findings:      The perianal and digital rectal examinations were normal. Pertinent       negatives include normal sphincter tone and no palpable rectal lesions.      Non-bleeding internal hemorrhoids were found during retroflexion. The       hemorrhoids were mild and Grade I (internal hemorrhoids that do not       prolapse).      Two sessile polyps were found in the transverse colon and ileocecal       valve. The polyps were 3 to 4 mm in size. These polyps were removed with       a jumbo cold forceps. Resection and retrieval were complete.      A 7 mm polyp was found in the transverse colon. The polyp was sessile.       The polyp was removed with a hot snare. Resection and retrieval were       complete.      A 15 mm polyp was found in the distal transverse colon. The polyp was       carpet-like. Area was successfully injected with 2 mL Eleview for a lift       polypectomy. The polyp was removed with a piecemeal technique using a       hot snare. Resection and retrieval were complete. Area was successfully       injected with 3 mL Spot (carbon black) for tattooing.      The exam was otherwise without abnormality. Impression:            - Non-bleeding internal hemorrhoids.                        - Two 3 to 4 mm polyps in the transverse colon and at                         the ileocecal valve, removed with a jumbo cold  forceps. Resected and retrieved.                        - One 7 mm polyp in the transverse colon, removed with                         a hot snare. Resected and retrieved.                        - One 15 mm polyp in the distal transverse colon,                          removed piecemeal using a hot snare. Resected and                         retrieved. Injected.                        - The examination was otherwise normal. Recommendation:        - Await pathology results from EGD, also performed                         today.                        - Patient has a contact number available for                         emergencies. The signs and symptoms of potential                         delayed complications were discussed with the patient.                         Return to normal activities tomorrow. Written                         discharge instructions were provided to the patient.                        - Resume previous diet.                        - Continue present medications.                        - Repeat colonoscopy is recommended for surveillance.                         The colonoscopy date will be determined after                         pathology results from today's exam become available                         for review.                        - Return to GI office PRN.                        -  The findings and recommendations were discussed with                         the patient. Procedure Code(s):     --- Professional ---                        [phone], Colonoscopy, flexible; with removal of                         tumor(s), polyp(s), or other lesion(s) by snare                         technique                        45380, 33, Colonoscopy, flexible; with biopsy, single                         or multiple                        45381, Colonoscopy, flexible; with directed submucosal                         injection(s), any substance Diagnosis Code(s):     --- Professional ---                        K64.0, First degree hemorrhoids                        Z12.11, Encounter for screening for malignant neoplasm                         of colon                        K63.5, Polyp of colon CPT copyright 2019 American  Medical Association. All rights reserved. The codes documented in this report are preliminary and upon coder review may  be revised to meet current compliance requirements. Efrain Sella MD, MD 10/08/2020 3:06:38 PM This report has been signed electronically. Number of Addenda: 0 Note Initiated On: 10/08/2020 2:10 PM Scope Withdrawal Time: 0 hours 21 minutes 21 seconds  Total Procedure Duration: 0 hours 24 minutes 49 seconds  Estimated Blood Loss:  Estimated blood loss: none.      [facility]

## 2020-10-08 NOTE — Op Note (Signed)
[facility] Gastroenterology Patient Name: [name] Procedure Date: 10/08/2020 2:11 PM MRN: [id number] Account #: [id number] Date of Birth: Oct 02, 1950 Admit Type: Outpatient Age: 70 Room: [facility] ENDO ROOM 2 Gender: Female Note Status: Finalized Procedure:             Upper GI endoscopy Indications:           Pharyngeal phase dysphagia, Suspected esophageal reflux Providers:             Benay Pike. Alice Reichert MD, MD Referring MD:          No Local Md, MD (Referring MD) Medicines:             Propofol per Anesthesia Complications:         No immediate complications. Procedure:             Pre-Anesthesia Assessment:                        - The risks and benefits of the procedure and the                         sedation options and risks were discussed with the                         patient. All questions were answered and informed                         consent was obtained.                        - Patient identification and proposed procedure were                         verified prior to the procedure by the nurse. The                         procedure was verified in the procedure room.                        - ASA Grade Assessment: III - A patient with severe                         systemic disease.                        - After reviewing the risks and benefits, the patient                         was deemed in satisfactory condition to undergo the                         procedure.                        After obtaining informed consent, the endoscope was                         passed under direct vision. Throughout the procedure,                         the  patient's blood pressure, pulse, and oxygen                         saturations were monitored continuously. The Endoscope                         was introduced through the mouth, and advanced to the                         third part of duodenum. The upper GI endoscopy was                          accomplished without difficulty. The patient tolerated                         the procedure well. Findings:      One tongue of salmon-colored mucosa was present. No other visible       abnormalities were present. The maximum longitudinal extent of these       esophageal mucosal changes was 0.5 cm in length. Mucosa was biopsied       with a cold forceps for histology. One specimen bottle was sent to       pathology.      A 2 cm hiatal hernia was present.      A single 5 mm sessile polyp with no bleeding and no stigmata of recent       bleeding was found in the cardia. Biopsies were taken with a cold       forceps for histology.      The examined duodenum was normal.      The exam was otherwise without abnormality. Impression:            - Salmon-colored mucosa suspicious for Barrett's                         esophagus. Biopsied.                        - 2 cm hiatal hernia.                        - A single gastric polyp. Biopsied.                        - Normal examined duodenum. Recommendation:        - Await pathology results.                        - Proceed with colonoscopy Procedure Code(s):     --- Professional ---                        [phone], Esophagogastroduodenoscopy, flexible,                         transoral; with biopsy, single or multiple Diagnosis Code(s):     --- Professional ---                        R13.13, Dysphagia, pharyngeal phase  K31.7, Polyp of stomach and duodenum                        K44.9, Diaphragmatic hernia without obstruction or                         gangrene                        K22.8, Other specified diseases of esophagus CPT copyright 2019 American Medical Association. All rights reserved. The codes documented in this report are preliminary and upon coder review may  be revised to meet current compliance requirements. Efrain Sella MD, MD 10/08/2020 2:30:37 PM This report has been signed electronically. Number of  Addenda: 0 Note Initiated On: 10/08/2020 2:11 PM Estimated Blood Loss:  Estimated blood loss: none. Estimated blood loss: none.      [facility]

## 2020-10-08 NOTE — Anesthesia Preprocedure Evaluation (Addendum)
Anesthesia Evaluation  Patient identified by MRN, date of birth, ID band Patient awake    Reviewed: Allergy & Precautions, NPO status , Patient's Chart, lab work & pertinent test results  History of Anesthesia Complications Negative for: history of anesthetic complications  Airway Mallampati: III       Dental  (+) Upper Dentures, Lower Dentures   Pulmonary asthma , sleep apnea (not using CPAP) , COPD,  COPD inhaler and oxygen dependent, Current Smoker,           Cardiovascular hypertension, Pt. on medications +CHF  (-) Past MI (-) dysrhythmias + Valvular Problems/Murmurs      Neuro/Psych Seizures - (last seizure 1/22),  Depression CVA (L sided weakness), Residual Symptoms    GI/Hepatic GERD  Medicated and Controlled,(+) Hepatitis - ([facility])  Endo/Other  diabetes, Type 2, Oral Hypoglycemic Agents  Renal/GU negative Renal ROS     Musculoskeletal   Abdominal   Peds  Hematology   Anesthesia Other Findings   Reproductive/Obstetrics                            Anesthesia Physical Anesthesia Plan  ASA: III  Anesthesia Plan: General   Post-op Pain Management:    Induction: Intravenous  PONV Risk Score and Plan: 2  Airway Management Planned: Nasal Cannula  Additional Equipment:   Intra-op Plan:   Post-operative Plan:   Informed Consent: I have reviewed the patients History and Physical, chart, labs and discussed the procedure including the risks, benefits and alternatives for the proposed anesthesia with the patient or authorized representative who has indicated his/her understanding and acceptance.       Plan Discussed with:   Anesthesia Plan Comments:         Anesthesia Quick Evaluation

## 2020-10-08 NOTE — Transfer of Care (Signed)
Immediate Anesthesia Transfer of Care Note  Patient: [name]  Procedure(s) Performed: ESOPHAGOGASTRODUODENOSCOPY (EGD) WITH PROPOFOL (N/A ) COLONOSCOPY WITH PROPOFOL (N/A )  Patient Location: Endoscopy Unit  Anesthesia Type:General  Level of Consciousness: drowsy  Airway & Oxygen Therapy: Patient Spontanous Breathing and Patient connected to nasal cannula oxygen  Post-op Assessment: Report given to RN and Post -op Vital signs reviewed and stable  Post vital signs: Reviewed  Last Vitals:  Vitals Value Taken Time  BP 118/53 10/08/20 1502  Temp 36.4 C 10/08/20 1501  Pulse 59 10/08/20 1503  Resp 29 10/08/20 1503  SpO2 91 % 10/08/20 1503  Vitals shown include unvalidated device data.  Last Pain:  Vitals:   10/08/20 1501  TempSrc: Temporal  PainSc: Asleep         Complications: No complications documented.

## 2020-10-08 NOTE — H&P (Signed)
Outpatient short stay form Pre-procedure 10/08/2020 1:32 PM [name] K. Alice Reichert, M.D.  Primary Physician: [facility]  Reason for visit:  Pharyngeal dysphagia, abnormal MBSS, colon cancer screening  History of present illness:  Patient with pharyngoesophageal dysphagia with hx of aspiration pneumonia. Patient presents for colonoscopy for colon cancer screening. The patient denies complaints of abdominal pain, significant change in bowel habits, or rectal bleeding.      Current Facility-Administered Medications:  .  0.9 %  sodium chloride infusion, , Intravenous, Continuous, Schuyler, Benay Pike, MD, Last Rate: 20 mL/hr at 10/08/20 1321, New Bag at 10/08/20 1321  Medications Prior to Admission  Medication Sig Dispense Refill Last Dose  . citalopram (CELEXA) 20 MG tablet Take 20 mg by mouth daily.   10/07/2020 at Unknown time  . furosemide (LASIX) 40 MG tablet Take 40 mg by mouth daily.   10/07/2020 at Unknown time  . levETIRAcetam (KEPPRA) 500 MG tablet Take 500 mg by mouth 2 (two) times daily.   10/07/2020 at Unknown time  . losartan (COZAAR) 25 MG tablet Take 25 mg by mouth daily.   10/08/2020 at Unknown time  . metoprolol succinate (TOPROL-XL) 25 MG 24 hr tablet Take 1 tablet (25 mg total) by mouth daily. 60 tablet 0 10/08/2020 at Unknown time  . montelukast (SINGULAIR) 10 MG tablet Take 10 mg by mouth at bedtime.   4 10/07/2020 at Unknown time  . omeprazole (PRILOSEC) 20 MG capsule Take 2 capsules (40 mg total) by mouth daily. 30 capsule 1 10/07/2020 at Unknown time  . rosuvastatin (CRESTOR) 10 MG tablet Take 10 mg by mouth daily.  5 10/07/2020 at Unknown time  . acetaminophen (TYLENOL) 325 MG tablet Take 2 tablets (650 mg total) by mouth every 6 (six) hours as needed for mild pain (or Fever >/= 101). (Patient not taking: Reported on 12/07/2019) 60 tablet 1   . albuterol (PROVENTIL HFA;VENTOLIN HFA) 108 (90 BASE) MCG/ACT inhaler Inhale 2 puffs into the lungs every 4 (four) hours as needed for  wheezing or shortness of breath.     Marland Kitchen albuterol (PROVENTIL) (2.5 MG/3ML) 0.083% nebulizer solution Take 3 mLs (2.5 mg total) by nebulization every 4 (four) hours as needed for wheezing or shortness of breath. 90 mL 5   . clopidogrel (PLAVIX) 75 MG tablet Take 1 tablet (75 mg total) by mouth daily. 30 tablet 1 10/03/2020  . guaiFENesin (MUCINEX) 600 MG 12 hr tablet Take 2 tablets (1,200 mg total) by mouth 2 (two) times daily. 30 tablet 0   . lacosamide 150 MG TABS Take 1 tablet (150 mg total) by mouth 2 (two) times daily. (Patient taking differently: Take 100 mg by mouth 2 (two) times daily.) 60 tablet 2   . nicotine (NICODERM CQ - DOSED IN MG/24 HOURS) 14 mg/24hr patch Place 1 patch (14 mg total) onto the skin daily. 28 patch 0   . OXYGEN Inhale 2 L into the lungs at bedtime.     . potassium chloride (KLOR-CON) 10 MEQ tablet Take 2 tablets (20 mEq total) by mouth daily. 60 tablet 0      Allergies  Allergen Reactions  . Aspirin Itching  . Celebrex [Celecoxib] Itching    itching  . Ciprofloxacin Itching  . Codeine Itching  . Fosphenytoin Itching  . Levaquin [Levofloxacin In D5w] Itching  . Levofloxacin Itching  . Lovastatin Itching  . Penicillins     Documentation indicates severe reaction  Pt tolerated cephalosporin without adverse reaction 09/18   . Pravastatin  Itching  . Sulfa Antibiotics Itching     Past Medical History:  Diagnosis Date  . Asthma   . CHF (congestive heart failure) ([place])   . Cirrhosis, non-alcoholic ([place])   . COPD (chronic obstructive pulmonary disease) ([place])   . Deaf   . Depression   . Diabetes mellitus without complication ([place])   . GERD (gastroesophageal reflux disease)   . Heart murmur   . Hepatitis   . History of rheumatic fever   . History of scarlet fever   . Hypertension   . IBS (irritable bowel syndrome)   . Lymph node disorder    arm  . Neuropathy   . On home oxygen therapy    hs  . Orthopnea   . Osteoarthritis   . RA (rheumatoid  arthritis) ([place])   . RLS (restless legs syndrome)   . Seizures ([place])   . Shortness of breath dyspnea   . Sleep apnea   . Stroke [facility])    tia    Review of systems:  Otherwise negative.    Physical Exam  Gen: Alert, oriented. Appears stated age.  HEENT: [place]/AT. PERRLA. Lungs: CTA, no wheezes. CV: RR nl S1, S2. Abd: soft, benign, no masses. BS+ Ext: No edema. Pulses 2+    Planned procedures: Proceed with EGD and colonoscopy. The patient understands the nature of the planned procedure, indications, risks, alternatives and potential complications including but not limited to bleeding, infection, perforation, damage to internal organs and possible oversedation/side effects from anesthesia. The patient agrees and gives consent to proceed.  Please refer to procedure notes for findings, recommendations and patient disposition/instructions.     Jean Skow K. Alice Reichert, M.D. Gastroenterology 10/08/2020  1:32 PM

## 2020-10-08 NOTE — Interval H&P Note (Signed)
History and Physical Interval Note:  10/08/2020 1:33 PM  [name]  has presented today for surgery, with the diagnosis of diarrhea functional, colon cancer screening, incontinence of feces, dysphagia, aspiration, pnemonia.  The various methods of treatment have been discussed with the patient and family. After consideration of risks, benefits and other options for treatment, the patient has consented to  Procedure(s) with comments: ESOPHAGOGASTRODUODENOSCOPY (EGD) WITH PROPOFOL (N/A) - DM DEAF, NEEDS SIGN INTERPRETER PER SON COLONOSCOPY WITH PROPOFOL (N/A) as a surgical intervention.  The patient's history has been reviewed, patient examined, no change in status, stable for surgery.  I have reviewed the patient's chart and labs.  Questions were answered to the patient's satisfaction.     [place], [place]

## 2020-10-09 ENCOUNTER — Encounter: Payer: Self-pay | Admitting: Internal Medicine

## 2020-10-10 LAB — SURGICAL PATHOLOGY

## 2020-10-15 ENCOUNTER — Other Ambulatory Visit: Payer: Self-pay

## 2020-10-15 ENCOUNTER — Ambulatory Visit
Admission: RE | Admit: 2020-10-15 | Discharge: 2020-10-15 | Disposition: A | Discharge status: Home / Self Care | Payer: Medicare HMO | Source: Ambulatory Visit | Attending: Family Medicine | Admitting: Family Medicine

## 2020-10-15 DIAGNOSIS — N6489 Other specified disorders of breast: Secondary | ICD-10-CM | POA: Diagnosis present

## 2020-10-15 DIAGNOSIS — R928 Other abnormal and inconclusive findings on diagnostic imaging of breast: Secondary | ICD-10-CM | POA: Insufficient documentation

## 2020-10-25 ENCOUNTER — Other Ambulatory Visit: Payer: Self-pay | Admitting: Family Medicine

## 2020-10-25 DIAGNOSIS — R928 Other abnormal and inconclusive findings on diagnostic imaging of breast: Secondary | ICD-10-CM

## 2020-10-25 DIAGNOSIS — R921 Mammographic calcification found on diagnostic imaging of breast: Secondary | ICD-10-CM

## 2020-10-30 ENCOUNTER — Other Ambulatory Visit: Payer: Self-pay

## 2020-10-30 ENCOUNTER — Ambulatory Visit
Admission: RE | Admit: 2020-10-30 | Discharge: 2020-10-30 | Disposition: A | Discharge status: Home / Self Care | Payer: Medicare HMO | Source: Ambulatory Visit | Attending: Family Medicine | Admitting: Family Medicine

## 2020-10-30 DIAGNOSIS — R928 Other abnormal and inconclusive findings on diagnostic imaging of breast: Secondary | ICD-10-CM

## 2020-10-30 DIAGNOSIS — R921 Mammographic calcification found on diagnostic imaging of breast: Secondary | ICD-10-CM

## 2020-10-30 HISTORY — PX: BREAST BIOPSY: SHX20

## 2020-10-31 LAB — SURGICAL PATHOLOGY

## 2021-01-21 ENCOUNTER — Other Ambulatory Visit: Payer: Self-pay

## 2021-01-21 ENCOUNTER — Other Ambulatory Visit
Admission: RE | Admit: 2021-01-21 | Discharge: 2021-01-21 | Disposition: A | Discharge status: Home / Self Care | Payer: Medicare Other | Source: Ambulatory Visit | Attending: Neurosurgery | Admitting: Neurosurgery

## 2021-01-21 DIAGNOSIS — Z01812 Encounter for preprocedural laboratory examination: Secondary | ICD-10-CM | POA: Diagnosis present

## 2021-01-21 DIAGNOSIS — Z20822 Contact with and (suspected) exposure to covid-19: Secondary | ICD-10-CM | POA: Insufficient documentation

## 2021-01-21 LAB — SARS CORONAVIRUS 2 (TAT 6-24 HRS): SARS Coronavirus 2: NEGATIVE

## 2021-01-23 ENCOUNTER — Ambulatory Visit: Payer: Medicare Other | Attending: Internal Medicine

## 2021-01-23 DIAGNOSIS — G4733 Obstructive sleep apnea (adult) (pediatric): Secondary | ICD-10-CM | POA: Insufficient documentation

## 2021-01-25 ENCOUNTER — Other Ambulatory Visit: Payer: Self-pay

## 2021-02-11 ENCOUNTER — Telehealth (INDEPENDENT_AMBULATORY_CARE_PROVIDER_SITE_OTHER): Payer: Medicare Other | Admitting: Pulmonary Disease

## 2021-02-11 DIAGNOSIS — G4733 Obstructive sleep apnea (adult) (pediatric): Secondary | ICD-10-CM | POA: Diagnosis not present

## 2021-02-11 NOTE — Telephone Encounter (Signed)
Moderate OSA - RDI 20/h Suggest CPAP, rather than dental appliance O2 was not required

## 2021-04-27 ENCOUNTER — Emergency Department: Payer: Medicare Other

## 2021-04-27 ENCOUNTER — Other Ambulatory Visit: Payer: Self-pay

## 2021-04-27 ENCOUNTER — Inpatient Hospital Stay
Admission: EM | Admit: 2021-04-27 | Discharge: 2021-05-01 | DRG: 190 | Disposition: A | Discharge status: Skilled Nursing Facility | Payer: Medicare Other | Attending: Student | Admitting: Student

## 2021-04-27 ENCOUNTER — Encounter: Payer: Self-pay | Admitting: Emergency Medicine

## 2021-04-27 DIAGNOSIS — G40919 Epilepsy, unspecified, intractable, without status epilepticus: Secondary | ICD-10-CM | POA: Diagnosis not present

## 2021-04-27 DIAGNOSIS — F17210 Nicotine dependence, cigarettes, uncomplicated: Secondary | ICD-10-CM | POA: Diagnosis present

## 2021-04-27 DIAGNOSIS — E876 Hypokalemia: Secondary | ICD-10-CM | POA: Diagnosis present

## 2021-04-27 DIAGNOSIS — R569 Unspecified convulsions: Secondary | ICD-10-CM | POA: Diagnosis present

## 2021-04-27 DIAGNOSIS — T380X5A Adverse effect of glucocorticoids and synthetic analogues, initial encounter: Secondary | ICD-10-CM | POA: Diagnosis not present

## 2021-04-27 DIAGNOSIS — G629 Polyneuropathy, unspecified: Secondary | ICD-10-CM | POA: Diagnosis present

## 2021-04-27 DIAGNOSIS — I6523 Occlusion and stenosis of bilateral carotid arteries: Secondary | ICD-10-CM | POA: Diagnosis present

## 2021-04-27 DIAGNOSIS — F419 Anxiety disorder, unspecified: Secondary | ICD-10-CM | POA: Diagnosis not present

## 2021-04-27 DIAGNOSIS — J441 Chronic obstructive pulmonary disease with (acute) exacerbation: Secondary | ICD-10-CM | POA: Diagnosis present

## 2021-04-27 DIAGNOSIS — I5032 Chronic diastolic (congestive) heart failure: Secondary | ICD-10-CM | POA: Diagnosis present

## 2021-04-27 DIAGNOSIS — F32A Depression, unspecified: Secondary | ICD-10-CM | POA: Diagnosis present

## 2021-04-27 DIAGNOSIS — G459 Transient cerebral ischemic attack, unspecified: Secondary | ICD-10-CM | POA: Diagnosis not present

## 2021-04-27 DIAGNOSIS — Z7902 Long term (current) use of antithrombotics/antiplatelets: Secondary | ICD-10-CM

## 2021-04-27 DIAGNOSIS — K529 Noninfective gastroenteritis and colitis, unspecified: Secondary | ICD-10-CM | POA: Diagnosis not present

## 2021-04-27 DIAGNOSIS — J9621 Acute and chronic respiratory failure with hypoxia: Secondary | ICD-10-CM | POA: Diagnosis present

## 2021-04-27 DIAGNOSIS — K7460 Unspecified cirrhosis of liver: Secondary | ICD-10-CM | POA: Diagnosis present

## 2021-04-27 DIAGNOSIS — M069 Rheumatoid arthritis, unspecified: Secondary | ICD-10-CM | POA: Diagnosis present

## 2021-04-27 DIAGNOSIS — I160 Hypertensive urgency: Secondary | ICD-10-CM | POA: Diagnosis present

## 2021-04-27 DIAGNOSIS — K580 Irritable bowel syndrome with diarrhea: Secondary | ICD-10-CM | POA: Diagnosis present

## 2021-04-27 DIAGNOSIS — Z803 Family history of malignant neoplasm of breast: Secondary | ICD-10-CM | POA: Diagnosis not present

## 2021-04-27 DIAGNOSIS — K219 Gastro-esophageal reflux disease without esophagitis: Secondary | ICD-10-CM | POA: Diagnosis present

## 2021-04-27 DIAGNOSIS — R0602 Shortness of breath: Secondary | ICD-10-CM | POA: Diagnosis present

## 2021-04-27 DIAGNOSIS — M1990 Unspecified osteoarthritis, unspecified site: Secondary | ICD-10-CM | POA: Diagnosis present

## 2021-04-27 DIAGNOSIS — Z20822 Contact with and (suspected) exposure to covid-19: Secondary | ICD-10-CM | POA: Diagnosis present

## 2021-04-27 DIAGNOSIS — H9190 Unspecified hearing loss, unspecified ear: Secondary | ICD-10-CM | POA: Diagnosis present

## 2021-04-27 DIAGNOSIS — Z880 Allergy status to penicillin: Secondary | ICD-10-CM

## 2021-04-27 DIAGNOSIS — I10 Essential (primary) hypertension: Secondary | ICD-10-CM | POA: Diagnosis not present

## 2021-04-27 DIAGNOSIS — Z96611 Presence of right artificial shoulder joint: Secondary | ICD-10-CM | POA: Diagnosis present

## 2021-04-27 DIAGNOSIS — Z8673 Personal history of transient ischemic attack (TIA), and cerebral infarction without residual deficits: Secondary | ICD-10-CM

## 2021-04-27 DIAGNOSIS — Z8249 Family history of ischemic heart disease and other diseases of the circulatory system: Secondary | ICD-10-CM

## 2021-04-27 DIAGNOSIS — E1165 Type 2 diabetes mellitus with hyperglycemia: Secondary | ICD-10-CM | POA: Diagnosis not present

## 2021-04-27 DIAGNOSIS — E875 Hyperkalemia: Secondary | ICD-10-CM | POA: Diagnosis not present

## 2021-04-27 DIAGNOSIS — Z801 Family history of malignant neoplasm of trachea, bronchus and lung: Secondary | ICD-10-CM | POA: Diagnosis not present

## 2021-04-27 DIAGNOSIS — Z886 Allergy status to analgesic agent status: Secondary | ICD-10-CM

## 2021-04-27 DIAGNOSIS — E785 Hyperlipidemia, unspecified: Secondary | ICD-10-CM | POA: Diagnosis present

## 2021-04-27 DIAGNOSIS — Z5320 Procedure and treatment not carried out because of patient's decision for unspecified reasons: Secondary | ICD-10-CM

## 2021-04-27 DIAGNOSIS — Z882 Allergy status to sulfonamides status: Secondary | ICD-10-CM

## 2021-04-27 DIAGNOSIS — Z79899 Other long term (current) drug therapy: Secondary | ICD-10-CM

## 2021-04-27 DIAGNOSIS — J449 Chronic obstructive pulmonary disease, unspecified: Secondary | ICD-10-CM

## 2021-04-27 DIAGNOSIS — I110 Hypertensive heart disease with heart failure: Secondary | ICD-10-CM | POA: Diagnosis present

## 2021-04-27 DIAGNOSIS — R350 Frequency of micturition: Secondary | ICD-10-CM | POA: Diagnosis not present

## 2021-04-27 DIAGNOSIS — Z9981 Dependence on supplemental oxygen: Secondary | ICD-10-CM

## 2021-04-27 DIAGNOSIS — G2581 Restless legs syndrome: Secondary | ICD-10-CM | POA: Diagnosis present

## 2021-04-27 DIAGNOSIS — R1319 Other dysphagia: Secondary | ICD-10-CM | POA: Diagnosis not present

## 2021-04-27 DIAGNOSIS — G40909 Epilepsy, unspecified, not intractable, without status epilepticus: Secondary | ICD-10-CM | POA: Diagnosis present

## 2021-04-27 DIAGNOSIS — I159 Secondary hypertension, unspecified: Secondary | ICD-10-CM

## 2021-04-27 DIAGNOSIS — G4730 Sleep apnea, unspecified: Secondary | ICD-10-CM | POA: Diagnosis present

## 2021-04-27 DIAGNOSIS — Z888 Allergy status to other drugs, medicaments and biological substances status: Secondary | ICD-10-CM

## 2021-04-27 DIAGNOSIS — Z881 Allergy status to other antibiotic agents status: Secondary | ICD-10-CM

## 2021-04-27 LAB — COMPREHENSIVE METABOLIC PANEL
ALT: 13 U/L (ref 0–44)
AST: 24 U/L (ref 15–41)
Albumin: 3.9 g/dL (ref 3.5–5.0)
Alkaline Phosphatase: 85 U/L (ref 38–126)
Anion gap: 14 (ref 5–15)
BUN: 7 mg/dL — ABNORMAL LOW (ref 8–23)
CO2: 29 mmol/L (ref 22–32)
Calcium: 8.9 mg/dL (ref 8.9–10.3)
Chloride: 99 mmol/L (ref 98–111)
Creatinine, Ser: 0.56 mg/dL (ref 0.44–1.00)
GFR, Estimated: >60 mL/min (ref >60)
Glucose, Bld: 125 mg/dL — ABNORMAL HIGH (ref 70–99)
Potassium: 2.4 mmol/L — CL (ref 3.5–5.1)
Sodium: 142 mmol/L (ref 135–145)
Total Bilirubin: 0.6 mg/dL (ref 0.3–1.2)
Total Protein: 7.5 g/dL (ref 6.5–8.1)

## 2021-04-27 LAB — CBC WITH DIFFERENTIAL/PLATELET
Abs Immature Granulocytes: 0.02 10*3/uL (ref 0.00–0.07)
Basophils Absolute: 0 10*3/uL (ref 0.0–0.1)
Basophils Relative: 1 %
Eosinophils Absolute: 0.1 10*3/uL (ref 0.0–0.5)
Eosinophils Relative: 1 %
HCT: 44.3 % (ref 36.0–46.0)
Hemoglobin: 15 g/dL (ref 12.0–15.0)
Immature Granulocytes: 0 %
Lymphocytes Relative: 35 %
Lymphs Abs: 2.6 10*3/uL (ref 0.7–4.0)
MCH: 28 pg (ref 26.0–34.0)
MCHC: 33.9 g/dL (ref 30.0–36.0)
MCV: 82.6 fL (ref 80.0–100.0)
Monocytes Absolute: 0.5 10*3/uL (ref 0.1–1.0)
Monocytes Relative: 7 %
Neutro Abs: 4.3 10*3/uL (ref 1.7–7.7)
Neutrophils Relative %: 56 %
Platelets: 258 10*3/uL (ref 150–400)
RBC: 5.36 MIL/uL — ABNORMAL HIGH (ref 3.87–5.11)
RDW: 13.8 % (ref 11.5–15.5)
WBC: 7.6 10*3/uL (ref 4.0–10.5)
nRBC: 0 % (ref 0.0–0.2)

## 2021-04-27 LAB — TROPONIN I (HIGH SENSITIVITY)
Troponin I (High Sensitivity): 23 ng/L — ABNORMAL HIGH (ref <18)
Troponin I (High Sensitivity): 26 ng/L — ABNORMAL HIGH (ref <18)

## 2021-04-27 LAB — MAGNESIUM: Magnesium: 1 mg/dL — ABNORMAL LOW (ref 1.7–2.4)

## 2021-04-27 LAB — BRAIN NATRIURETIC PEPTIDE: B Natriuretic Peptide: 385.8 pg/mL — ABNORMAL HIGH (ref 0.0–100.0)

## 2021-04-27 MED ORDER — IPRATROPIUM-ALBUTEROL 0.5-2.5 (3) MG/3ML IN SOLN
3.0000 mL | Freq: Four times a day (QID) | RESPIRATORY_TRACT | Status: DC
  Administered 2021-04-28 – 2021-04-30 (×8): 3 mL via RESPIRATORY_TRACT
  Filled 2021-04-27 (×8): qty 3

## 2021-04-27 MED ORDER — MAGNESIUM SULFATE 2 GM/50ML IV SOLN
2.0000 g | Freq: Once | INTRAVENOUS | Status: AC
  Administered 2021-04-27: 2 g via INTRAVENOUS
  Filled 2021-04-27: qty 50

## 2021-04-27 MED ORDER — ACETAMINOPHEN 325 MG PO TABS
650.0000 mg | ORAL_TABLET | Freq: Four times a day (QID) | ORAL | Status: DC | PRN
  Administered 2021-05-01: 650 mg via ORAL
  Filled 2021-04-27: qty 2

## 2021-04-27 MED ORDER — LACOSAMIDE 150 MG PO TABS: 100.0000 mg | ORAL_TABLET | Freq: Two times a day (BID) | ORAL | Status: DC

## 2021-04-27 MED ORDER — CLOPIDOGREL BISULFATE 75 MG PO TABS
75.0000 mg | ORAL_TABLET | Freq: Every day | ORAL | Status: DC
  Administered 2021-04-28 – 2021-05-01 (×4): 75 mg via ORAL
  Filled 2021-04-27 (×4): qty 1

## 2021-04-27 MED ORDER — CITALOPRAM HYDROBROMIDE 20 MG PO TABS
20.0000 mg | ORAL_TABLET | Freq: Every day | ORAL | Status: DC
  Administered 2021-04-28 – 2021-05-01 (×4): 20 mg via ORAL
  Filled 2021-04-27 (×4): qty 1

## 2021-04-27 MED ORDER — MONTELUKAST SODIUM 10 MG PO TABS
10.0000 mg | ORAL_TABLET | Freq: Every day | ORAL | Status: DC
  Administered 2021-04-28 – 2021-04-30 (×3): 10 mg via ORAL
  Filled 2021-04-27 (×3): qty 1

## 2021-04-27 MED ORDER — POTASSIUM CHLORIDE ER 10 MEQ PO TBCR
20.0000 meq | EXTENDED_RELEASE_TABLET | Freq: Every day | ORAL | Status: DC
  Administered 2021-04-28: 20 meq via ORAL
  Filled 2021-04-27 (×2): qty 2

## 2021-04-27 MED ORDER — LEVETIRACETAM 500 MG PO TABS: 500.0000 mg | ORAL_TABLET | Freq: Two times a day (BID) | ORAL | Status: DC

## 2021-04-27 MED ORDER — GUAIFENESIN ER 600 MG PO TB12: 1200.0000 mg | ORAL_TABLET | Freq: Two times a day (BID) | ORAL | Status: DC

## 2021-04-27 MED ORDER — PREDNISONE 20 MG PO TABS
40.0000 mg | ORAL_TABLET | Freq: Every day | ORAL | Status: DC
  Administered 2021-04-29 – 2021-05-01 (×3): 40 mg via ORAL
  Filled 2021-04-27 (×3): qty 2

## 2021-04-27 MED ORDER — POTASSIUM CHLORIDE CRYS ER 20 MEQ PO TBCR
40.0000 meq | EXTENDED_RELEASE_TABLET | Freq: Once | ORAL | Status: AC
  Administered 2021-04-27: 40 meq via ORAL
  Filled 2021-04-27: qty 2

## 2021-04-27 MED ORDER — ROSUVASTATIN CALCIUM 10 MG PO TABS
10.0000 mg | ORAL_TABLET | Freq: Every day | ORAL | Status: DC
  Administered 2021-04-28 – 2021-05-01 (×4): 10 mg via ORAL
  Filled 2021-04-27 (×6): qty 1

## 2021-04-27 MED ORDER — IPRATROPIUM-ALBUTEROL 0.5-2.5 (3) MG/3ML IN SOLN
3.0000 mL | Freq: Once | RESPIRATORY_TRACT | Status: AC
  Administered 2021-04-27: 3 mL via RESPIRATORY_TRACT
  Filled 2021-04-27: qty 3

## 2021-04-27 MED ORDER — METHYLPREDNISOLONE SODIUM SUCC 125 MG IJ SOLR
125.0000 mg | Freq: Once | INTRAMUSCULAR | Status: AC
  Administered 2021-04-27: 125 mg via INTRAVENOUS
  Filled 2021-04-27: qty 2

## 2021-04-27 MED ORDER — ONDANSETRON HCL 4 MG PO TABS: 4.0000 mg | ORAL_TABLET | Freq: Four times a day (QID) | ORAL | Status: DC | PRN

## 2021-04-27 MED ORDER — POTASSIUM CHLORIDE IN NACL 20-0.9 MEQ/L-% IV SOLN
INTRAVENOUS | Status: DC
  Filled 2021-04-27: qty 1000

## 2021-04-27 MED ORDER — NICOTINE 14 MG/24HR TD PT24
14.0000 mg | MEDICATED_PATCH | Freq: Every day | TRANSDERMAL | Status: DC
  Administered 2021-04-28 – 2021-05-01 (×4): 14 mg via TRANSDERMAL
  Filled 2021-04-27 (×4): qty 1

## 2021-04-27 MED ORDER — CEFTRIAXONE SODIUM 1 G IJ SOLR
1.0000 g | INTRAMUSCULAR | Status: DC
  Administered 2021-04-28 (×2): 1 g via INTRAVENOUS
  Filled 2021-04-27 (×3): qty 10

## 2021-04-27 MED ORDER — POTASSIUM CHLORIDE 10 MEQ/100ML IV SOLN
10.0000 meq | Freq: Once | INTRAVENOUS | Status: AC
  Administered 2021-04-27: 10 meq via INTRAVENOUS
  Filled 2021-04-27: qty 100

## 2021-04-27 MED ORDER — ENOXAPARIN SODIUM 40 MG/0.4ML IJ SOSY
40.0000 mg | PREFILLED_SYRINGE | INTRAMUSCULAR | Status: DC
  Administered 2021-04-28 – 2021-05-01 (×4): 40 mg via SUBCUTANEOUS
  Filled 2021-04-27 (×4): qty 0.4

## 2021-04-27 MED ORDER — LOSARTAN POTASSIUM 25 MG PO TABS
25.0000 mg | ORAL_TABLET | Freq: Every day | ORAL | Status: DC
  Administered 2021-04-28 – 2021-05-01 (×4): 25 mg via ORAL
  Filled 2021-04-27 (×4): qty 1

## 2021-04-27 MED ORDER — MAGNESIUM HYDROXIDE 400 MG/5ML PO SUSP: 30.0000 mL | Freq: Every day | ORAL | Status: DC | PRN

## 2021-04-27 MED ORDER — TRAZODONE HCL 50 MG PO TABS: 25.0000 mg | ORAL_TABLET | Freq: Every evening | ORAL | Status: DC | PRN

## 2021-04-27 MED ORDER — METHYLPREDNISOLONE SODIUM SUCC 40 MG IJ SOLR
40.0000 mg | Freq: Two times a day (BID) | INTRAMUSCULAR | Status: AC
Start: 2021-04-28 — End: 2021-04-28
  Administered 2021-04-28 (×2): 40 mg via INTRAVENOUS
  Filled 2021-04-27 (×2): qty 1

## 2021-04-27 MED ORDER — ONDANSETRON HCL 4 MG/2ML IJ SOLN: 4.0000 mg | Freq: Four times a day (QID) | INTRAMUSCULAR | Status: DC | PRN

## 2021-04-27 MED ORDER — ACETAMINOPHEN 650 MG RE SUPP
650.0000 mg | Freq: Four times a day (QID) | RECTAL | Status: DC | PRN
  Filled 2021-04-27: qty 1

## 2021-04-27 MED ORDER — METOPROLOL SUCCINATE ER 25 MG PO TB24
25.0000 mg | ORAL_TABLET | Freq: Every day | ORAL | Status: DC
  Administered 2021-04-28 – 2021-05-01 (×4): 25 mg via ORAL
  Filled 2021-04-27 (×4): qty 1

## 2021-04-27 MED ORDER — PANTOPRAZOLE SODIUM 40 MG PO TBEC
40.0000 mg | DELAYED_RELEASE_TABLET | Freq: Every day | ORAL | Status: DC
  Administered 2021-04-28 – 2021-05-01 (×4): 40 mg via ORAL
  Filled 2021-04-27 (×4): qty 1

## 2021-04-27 NOTE — ED Notes (Signed)
Seen pt in room , ASN interpreter [phone] c/o [place] Gu[place]  used to inform pt of interventions done this time

## 2021-04-27 NOTE — ED Provider Notes (Signed)
Emergency Medicine Provider Triage Evaluation Note  [name] , a 70 y.o. female  was evaluated in triage.  Pt complains of headache, elevated blood pressure, cough and shortness of breath at home.  Patient has a history of seizures, hypertension and COPD currently on 2 L both during the day and at night.  Patient is accompanied by her son who states that patient has complained of feeling unwell at home.  She denies current chest pain.  She has some mild chest tightness.  No nausea, vomiting or abdominal pain.  She is been afebrile at home. Review of Systems  Positive: Patient has cough, elevated blood pressure and headache.  Negative: Patient denies chest pain or abdominal pain.   Physical Exam  Ht _0  (1.6 m)   Wt 61.2 kg   BMI 23.91 kg/m  Gen:   Awake, no distress   Resp:  Patient coughing and mildly tachypneic with diffuse crackles auscultated bilaterally.  Diminished breath sounds in the bases bilaterally. MSK:   Moves extremities without difficulty  Other:    Medical Decision Making  Medically screening exam initiated at 5:17 PM.  Appropriate orders placed.  [name] was informed that the remainder of the evaluation will be completed by another provider, this initial triage assessment does not replace that evaluation, and the importance of remaining in the ED until their evaluation is complete.     Vallarie Mare [place], PA-C 04/27/21 1721    Carrie Mew, MD 04/28/21 [phone]

## 2021-04-27 NOTE — H&P (Signed)
[facility]   PATIENT NAME: [name]    MR#:  [id number]  DATE OF BIRTH:  1951-05-31  DATE OF ADMISSION:  04/27/2021  PRIMARY CARE PHYSICIAN: [facility] [place]   Patient is coming from: Home    REQUESTING/REFERRING PHYSICIAN: Marjean Donna, MD  CHIEF COMPLAINT:   Chief Complaint  Patient presents with  . Shortness of Breath  . Hypertension  . Headache    HISTORY OF PRESENT ILLNESS:  [name] is a 70 y.o. Caucasian female with medical history significant for COPD with chronic respiratory failure on home O2 at 2 L/min at nighttime, she has been having CHF, GERD, depression, DM2, liver cirrhosis, deafness, hypertension, rheumatoid arthritis, seizure disorder and CVA, who presented to the emergency room with acute onset of worsening dyspnea with associated cough and wheezing.  She has been having elevated blood pressure over the last 3 days.  She has been compliant with her antihypertensives.  She admits to headache without dizziness or blurred vision.  She was noted to be hypoxic in triage with approximately 86% on room air with tachypnea upon ambulation.  She has been out of her albuterol.  No reported chest pain or palpitations or nausea or vomiting or abdominal pain.  No reported bleeding diathesis.  No reported dysuria, oliguria or hematuria or flank pain.  ED Course: When she came to the ER, blood pressure was 216/84 and later 181/73 with a heart rate of 62 respiratory rate of 24 and pulse symmetry was 93% on 2 L of O2 per nasal cannula and later 98%.  Labs reveal-year-old labs revealed hypokalemia of 2.4 and otherwise unremarkable CMP.  BNP was 358.8.  High-sensitivity troponin was 23 and later 26.  CBC was within normal. EKG as reviewed by me : EKG showed normal sinus rhythm with a rate of 64. Imaging: Chest x-ray showed no acute cardiopulmonary disease. - Noncontrast head CT scan revealed no acute intracranial normalities.    The patient was  given 125 mg of IV Solu-Medrol as well as 3 DuoNeb's, 2 g of IV magnesium sulfate and 40 milli- equivalent p.o. potassium chloride as well as 10 mEq IV potassium chloride.  She will be admitted to a PCU bed for further evaluation and management. PAST MEDICAL HISTORY:   Past Medical History:  Diagnosis Date  . Asthma   . CHF (congestive heart failure) ([place])   . Cirrhosis, non-alcoholic ([place])   . COPD (chronic obstructive pulmonary disease) ([place])   . Deaf   . Depression   . Diabetes mellitus without complication ([place])   . GERD (gastroesophageal reflux disease)   . Heart murmur   . Hepatitis   . History of rheumatic fever   . History of scarlet fever   . Hypertension   . IBS (irritable bowel syndrome)   . Lymph node disorder    arm  . Neuropathy   . On home oxygen therapy    hs  . Orthopnea   . Osteoarthritis   . RA (rheumatoid arthritis) ([place])   . RLS (restless legs syndrome)   . Seizures ([place])   . Shortness of breath dyspnea   . Sleep apnea   . Stroke [facility])    tia    PAST SURGICAL HISTORY:   Past Surgical History:  Procedure Laterality Date  . ABDOMINAL HYSTERECTOMY    . BREAST BIOPSY Right 10/30/2020   Stereo Bx, X-clip, path pending   . CATARACT EXTRACTION W/PHACO Right 11/23/2014   Procedure:  CATARACT EXTRACTION PHACO AND INTRAOCULAR LENS PLACEMENT (IOC);  Surgeon: Lyla Glassing, MD;  Location: [facility] ORS;  Service: Ophthalmology;  Laterality: Right;  . CATARACT EXTRACTION W/PHACO Left 12/14/2014   Procedure: CATARACT EXTRACTION PHACO AND INTRAOCULAR LENS PLACEMENT (IOC);  Surgeon: Lyla Glassing, MD;  Location: [facility] ORS;  Service: Ophthalmology;  Laterality: Left;  US:01:16.6 AP:15.8 CDE:12.14  . CESAREAN SECTION    . CHOLECYSTECTOMY    . COLONOSCOPY WITH PROPOFOL N/A 10/08/2020   Procedure: COLONOSCOPY WITH PROPOFOL;  Surgeon: Toledo, Benay Pike, MD;  Location: [facility] ENDOSCOPY;  Service: Gastroenterology;  Laterality: N/A;  . ESOPHAGOGASTRODUODENOSCOPY (EGD) WITH  PROPOFOL N/A 10/08/2020   Procedure: ESOPHAGOGASTRODUODENOSCOPY (EGD) WITH PROPOFOL;  Surgeon: Toledo, Benay Pike, MD;  Location: [facility] ENDOSCOPY;  Service: Gastroenterology;  Laterality: N/A;  DM DEAF, NEEDS SIGN INTERPRETER PER SON  . REVERSE SHOULDER ARTHROPLASTY Right 12/21/2019   Procedure: REVERSE SHOULDER ARTHROPLASTY;  Surgeon: Lovell Sheehan, MD;  Location: [facility] ORS;  Service: Orthopedics;  Laterality: Right;  . THUMB ARTHROSCOPY    . TONSILLECTOMY    . TYMPANOPLASTY     muliple    SOCIAL HISTORY:   Social History   Tobacco Use  . Smoking status: Every Day    Packs/day: 0.50    Years: 52.00    Pack years: 26.00    Types: Cigarettes    Start date: 03/01/1968  . Smokeless tobacco: Never  Substance Use Topics  . Alcohol use: No    FAMILY HISTORY:   Family History  Problem Relation Age of Onset  . Lung cancer Mother   . CAD Father   . Breast cancer Sister     DRUG ALLERGIES:   Allergies  Allergen Reactions  . Aspirin Itching  . Celebrex [Celecoxib] Itching    itching  . Ciprofloxacin Itching  . Codeine Itching  . Fosphenytoin Itching  . Levaquin [Levofloxacin In D5w] Itching  . Levofloxacin Itching  . Lovastatin Itching  . Penicillins     Documentation indicates severe reaction  Pt tolerated cephalosporin without adverse reaction 09/18   . Pravastatin Itching  . Sulfa Antibiotics Itching    REVIEW OF SYSTEMS:   ROS As per history of present illness. All pertinent systems were reviewed above. Constitutional, HEENT, cardiovascular, respiratory, GI, GU, musculoskeletal, neuro, psychiatric, endocrine, integumentary and hematologic systems were reviewed and are otherwise negative/unremarkable except for positive findings mentioned above in the HPI.   MEDICATIONS AT HOME:   Prior to Admission medications   Medication Sig Start Date End Date Taking? Authorizing Provider  acetaminophen (TYLENOL) 325 MG tablet Take 2 tablets (650 mg total) by mouth every 6  (six) hours as needed for mild pain (or Fever >/= 101). Patient not taking: Reported on 12/07/2019 08/08/19   Thornell Mule, MD  albuterol (PROVENTIL HFA;VENTOLIN HFA) 108 (90 BASE) MCG/ACT inhaler Inhale 2 puffs into the lungs every 4 (four) hours as needed for wheezing or shortness of breath.    [provider]  albuterol (PROVENTIL) (2.5 MG/3ML) 0.083% nebulizer solution Take 3 mLs (2.5 mg total) by nebulization every 4 (four) hours as needed for wheezing or shortness of breath. 09/23/17   Alisa Graff, FNP  citalopram (CELEXA) 20 MG tablet Take 20 mg by mouth daily. 03/02/19   [provider]  clopidogrel (PLAVIX) 75 MG tablet Take 1 tablet (75 mg total) by mouth daily. 07/17/16   Vaughan Basta, MD  furosemide (LASIX) 40 MG tablet Take 40 mg by mouth daily. 07/22/18   [provider]  guaiFENesin (  MUCINEX) 600 MG 12 hr tablet Take 2 tablets (1,200 mg total) by mouth 2 (two) times daily. 08/01/20   Hosie Poisson, MD  lacosamide 150 MG TABS Take 1 tablet (150 mg total) by mouth 2 (two) times daily. Patient taking differently: Take 100 mg by mouth 2 (two) times daily. 08/01/20   Hosie Poisson, MD  levETIRAcetam (KEPPRA) 500 MG tablet Take 500 mg by mouth 2 (two) times daily.    [provider]  losartan (COZAAR) 25 MG tablet Take 25 mg by mouth daily. 07/27/20   [provider]  metoprolol succinate (TOPROL-XL) 25 MG 24 hr tablet Take 1 tablet (25 mg total) by mouth daily. 08/08/19   Thornell Mule, MD  montelukast (SINGULAIR) 10 MG tablet Take 10 mg by mouth at bedtime.  03/18/16   [provider]  nicotine (NICODERM CQ - DOSED IN MG/24 HOURS) 14 mg/24hr patch Place 1 patch (14 mg total) onto the skin daily. 08/01/20   Hosie Poisson, MD  omeprazole (PRILOSEC) 20 MG capsule Take 2 capsules (40 mg total) by mouth daily. 08/01/20   Hosie Poisson, MD  OXYGEN Inhale 2 L into the lungs at bedtime.    [provider]  potassium chloride  (KLOR-CON) 10 MEQ tablet Take 2 tablets (20 mEq total) by mouth daily. 08/08/19 12/09/19  Thornell Mule, MD  rosuvastatin (CRESTOR) 10 MG tablet Take 10 mg by mouth daily. 04/21/17   [provider]      VITAL SIGNS:  Blood pressure (!) 181/73, pulse 62, temperature 98.4 F (36.9 C), temperature source Oral, resp. rate (!) 28, height _0  (1.6 m), weight 61.2 kg, SpO2 93 %.  PHYSICAL EXAMINATION:  Physical Exam  GENERAL:  70 y.o.-year-old Caucasian female patient lying in the bed with no acute distress.  She was later witnessed to have active seizure with head and torso shaking. EYES: Pupils equal, round, reactive to light and accommodation. No scleral icterus. Extraocular muscles intact.  HEENT: Head atraumatic, normocephalic. Oropharynx and nasopharynx clear.  NECK:  Supple, no jugular venous distention. No thyroid enlargement, no tenderness.  LUNGS: Normal breath sounds bilaterally, no wheezing, rales,rhonchi or crepitation. No use of accessory muscles of respiration.  CARDIOVASCULAR: Regular rate and rhythm, S1, S2 normal. No murmurs, rubs, or gallops.  ABDOMEN: Soft, nondistended, nontender. Bowel sounds present. No organomegaly or mass.  EXTREMITIES: No pedal edema, cyanosis, or clubbing.  NEUROLOGIC: She was later witnessed to have active seizure with head and torso shaking..  She had left gaze preference and was not moving her right side..  She had postictal confusion.  Sensation intact. Gait not checked.  PSYCHIATRIC: The patient was initially confused and was back to her mental status after being postictal. SKIN: No obvious rash, lesion, or ulcer.   LABORATORY PANEL:   CBC Recent Labs  Lab 04/27/21 1729  WBC 7.6  HGB 15.0  HCT 44.3  PLT 258   ------------------------------------------------------------------------------------------------------------------  Chemistries  Recent Labs  Lab 04/27/21 1729  NA 142  K 2.4*  CL 99  CO2 29  GLUCOSE 125*  BUN 7*   CREATININE 0.56  CALCIUM 8.9  MG 1.0*  AST 24  ALT 13  ALKPHOS 85  BILITOT 0.6   ------------------------------------------------------------------------------------------------------------------  Cardiac Enzymes No results for input(s): TROPONINI in the last 168 hours. ------------------------------------------------------------------------------------------------------------------  RADIOLOGY:  DG Chest 2 View  Result Date: 04/27/2021 CLINICAL DATA:  Shortness of breath and cough EXAM: CHEST - 2 VIEW COMPARISON:  Chest x-ray 07/29/2020, CT chest 08/14/2017 FINDINGS:  The heart and mediastinal contours are unchanged. Aortic calcification. No focal consolidation. No pulmonary edema. No pleural effusion. No pneumothorax. No acute osseous abnormality. Total reversed right shoulder arthroplasty. IMPRESSION: No active cardiopulmonary disease. Electronically Signed   By: Iven Finn M.D.   On: 04/27/2021 17:51   CT Head Wo Contrast  Result Date: 04/27/2021 CLINICAL DATA:  Headache, elevated blood pressure, cough, and shortness of breath. EXAM: CT HEAD WITHOUT CONTRAST TECHNIQUE: Contiguous axial images were obtained from the base of the skull through the vertex without intravenous contrast. COMPARISON:  CT head dated 07/29/2020. FINDINGS: Brain: No evidence of acute infarction, hemorrhage, hydrocephalus, extra-axial collection or mass lesion/mass effect. Periventricular white matter hypoattenuation likely represents chronic small vessel ischemic disease. Vascular: There are vascular calcifications in the carotid siphons. Skull: Negative for fracture or focal lesion. Sinuses/Orbits: The patient is status post a left canal wall up mastoidectomy. The paranasal sinuses are clear. Other: None. IMPRESSION: No acute intracranial process. Electronically Signed   By: Zerita Boers M.D.   On: 04/27/2021 18:13      IMPRESSION AND PLAN:  Active Problems:   COPD exacerbation ([place])  1.  COPD acute  exacerbation with chronic respiratory failure. - The patient be admitted to a PCU bed. - Continue bronchodilator therapy with pending every 4 hours as needed. - We will continue steroid therapy with IV Solu-Medrol. - Mucolytic therapy will be provided. - We will place her on IV Rocephin. - We will hold off Symbicort. - We will continue Singulair.  2.  Hypokalemia and severe hypomagnesemia. - Potassium and magnesium will be aggressively replaced and followed.  3.  Type II diabetes mellitus. - Patient will be placed on supplemental coverage with NovoLog. - We will continue Amaryl while monitoring for any hypoglycemia.  4.  Depression. - Continue Celexa.  5.  Essential hypertension. - We will continue Cozaar and Toprol-XL while holding off HCTZ.  6.  Dyslipidemia. - We will continue statin therapy.  7.  GERD. - PPI therapy will be resumed.  DVT prophylaxis: Lovenox. Code Status: full code. Family Communication:  The plan of care was discussed in details with the patient (and family). I answered all questions. The patient agreed to proceed with the above mentioned plan. Further management will depend upon hospital course. Disposition Plan: Back to previous home environment Consults called: Neurology.   All the records are reviewed and case discussed with ED provider.  Status is: Inpatient  Remains inpatient appropriate because:Ongoing diagnostic testing needed not appropriate for outpatient work up, Unsafe d/c plan, IV treatments appropriate due to intensity of illness or inability to take PO, and Inpatient level of care appropriate due to severity of illness   Dispo: The patient is from: Home              Anticipated d/c is to: Home              Patient currently is not medically stable to d/c.              Difficult to place patient: No  TOTAL TIME TAKING CARE OF THIS PATIENT: 60 minutes.     Christel Mormon M.D on 04/27/2021 at 11:26 PM  [facility]   From 7  PM-7 AM, contact night-coverage [email or web address]  CC: Primary care physician; [facility]

## 2021-04-27 NOTE — ED Provider Notes (Signed)
[facility] Emergency Department Provider Note  ____________________________________________   Event Date/Time   First MD Initiated Contact with Patient 04/27/21 2207     (approximate)  I have reviewed the triage vital signs and the nursing notes.   HISTORY  Chief Complaint Shortness of Breath, Hypertension, and Headache    HPI [name] is a 70 y.o. female with history of CHF, diabetes, cirrhosis, COPD on 2 L at nighttime who comes in with multiple concerns.  Patient is noted over the past 3 days her blood pressures been elevated.  She been compliant with her losartan, metoprolol however she is continue to have elevated blood pressures.  She started developing some headaches as well as some worsening shortness of breath.  She reports some coughing.  She states that she is out of her albuterol.  In triage patient was 86% and tachypneic after walking.  No abdominal pain.  No unilateral leg swelling.          Past Medical History:  Diagnosis Date   Asthma    CHF (congestive heart failure) (HCC)    Cirrhosis, non-alcoholic (HCC)    COPD (chronic obstructive pulmonary disease) ([place])    Deaf    Depression    Diabetes mellitus without complication ([place])    GERD (gastroesophageal reflux disease)    Heart murmur    Hepatitis    History of rheumatic fever    History of scarlet fever    Hypertension    IBS (irritable bowel syndrome)    Lymph node disorder    arm   Neuropathy    On home oxygen therapy    hs   Orthopnea    Osteoarthritis    RA (rheumatoid arthritis) (HCC)    RLS (restless legs syndrome)    Seizures (HCC)    Shortness of breath dyspnea    Sleep apnea    Stroke ([place])    tia    Patient Active Problem List   Diagnosis Date Noted   Aspiration pneumonia ([place]) 07/29/2020   S/P reverse total shoulder arthroplasty, right 12/21/2019   Comminuted fracture of right humerus 08/05/2019   History of CVA in adulthood 08/05/2019    COPD (chronic obstructive pulmonary disease) ([place])    Deaf    On home oxygen therapy    Cirrhosis, non-alcoholic ([place])    Asthma    Fall    Closed comminuted fracture of right humerus    Hypomagnesemia    PNA (pneumonia) 03/01/2018   Bronchitis 09/23/2017   Chronic diastolic heart failure ([place]) 08/26/2017   Tobacco use 08/26/2017   Seizure disorder ([place]) 06/15/2017   Goals of care, counseling/discussion 05/06/2017   Status epilepticus ([place])    CAP (community acquired pneumonia)    Hypoxia    Hypokalemia 02/12/2017   GERD (gastroesophageal reflux disease) 03/23/2016   Depression 03/23/2016   Diabetes mellitus without complication ([place]) 41/71/2787   Essential hypertension 03/22/2016   Neuropathy 03/22/2016   Seizure ([place]) 03/22/2016   COPD exacerbation ([place]) 03/31/2015    Past Surgical History:  Procedure Laterality Date   ABDOMINAL HYSTERECTOMY     BREAST BIOPSY Right 10/30/2020   Stereo Bx, X-clip, path pending    CATARACT EXTRACTION W/PHACO Right 11/23/2014   Procedure: CATARACT EXTRACTION PHACO AND INTRAOCULAR LENS PLACEMENT ([place]);  Surgeon: Lyla Glassing, MD;  Location: [facility] ORS;  Service: Ophthalmology;  Laterality: Right;   CATARACT EXTRACTION W/PHACO Left 12/14/2014   Procedure: CATARACT EXTRACTION PHACO AND INTRAOCULAR LENS PLACEMENT (IOC);  Surgeon: Lyla Glassing, MD;  Location: [facility] ORS;  Service: Ophthalmology;  Laterality: Left;  US:01:16.6 AP:15.8 CDE:12.14   CESAREAN SECTION     CHOLECYSTECTOMY     COLONOSCOPY WITH PROPOFOL N/A 10/08/2020   Procedure: COLONOSCOPY WITH PROPOFOL;  Surgeon: Toledo, Benay Pike, MD;  Location: [facility] ENDOSCOPY;  Service: Gastroenterology;  Laterality: N/A;   ESOPHAGOGASTRODUODENOSCOPY (EGD) WITH PROPOFOL N/A 10/08/2020   Procedure: ESOPHAGOGASTRODUODENOSCOPY (EGD) WITH PROPOFOL;  Surgeon: Toledo, Benay Pike, MD;  Location: [facility] ENDOSCOPY;  Service: Gastroenterology;  Laterality: N/A;  DM DEAF, NEEDS SIGN INTERPRETER PER SON   REVERSE  SHOULDER ARTHROPLASTY Right 12/21/2019   Procedure: REVERSE SHOULDER ARTHROPLASTY;  Surgeon: Lovell Sheehan, MD;  Location: [facility] ORS;  Service: Orthopedics;  Laterality: Right;   THUMB ARTHROSCOPY     TONSILLECTOMY     TYMPANOPLASTY     muliple    Prior to Admission medications   Medication Sig Start Date End Date Taking? Authorizing Provider  acetaminophen (TYLENOL) 325 MG tablet Take 2 tablets (650 mg total) by mouth every 6 (six) hours as needed for mild pain (or Fever >/= 101). Patient not taking: Reported on 12/07/2019 08/08/19   Thornell Mule, MD  albuterol (PROVENTIL HFA;VENTOLIN HFA) 108 (90 BASE) MCG/ACT inhaler Inhale 2 puffs into the lungs every 4 (four) hours as needed for wheezing or shortness of breath.    [provider]  albuterol (PROVENTIL) (2.5 MG/3ML) 0.083% nebulizer solution Take 3 mLs (2.5 mg total) by nebulization every 4 (four) hours as needed for wheezing or shortness of breath. 09/23/17   Alisa Graff, FNP  citalopram (CELEXA) 20 MG tablet Take 20 mg by mouth daily. 03/02/19   [provider]  clopidogrel (PLAVIX) 75 MG tablet Take 1 tablet (75 mg total) by mouth daily. 07/17/16   Vaughan Basta, MD  furosemide (LASIX) 40 MG tablet Take 40 mg by mouth daily. 07/22/18   [provider]  guaiFENesin (MUCINEX) 600 MG 12 hr tablet Take 2 tablets (1,200 mg total) by mouth 2 (two) times daily. 08/01/20   Hosie Poisson, MD  lacosamide 150 MG TABS Take 1 tablet (150 mg total) by mouth 2 (two) times daily. Patient taking differently: Take 100 mg by mouth 2 (two) times daily. 08/01/20   Hosie Poisson, MD  levETIRAcetam (KEPPRA) 500 MG tablet Take 500 mg by mouth 2 (two) times daily.    [provider]  losartan (COZAAR) 25 MG tablet Take 25 mg by mouth daily. 07/27/20   [provider]  metoprolol succinate (TOPROL-XL) 25 MG 24 hr tablet Take 1 tablet (25 mg total) by mouth daily. 08/08/19   Thornell Mule, MD  montelukast  (SINGULAIR) 10 MG tablet Take 10 mg by mouth at bedtime.  03/18/16   [provider]  nicotine (NICODERM CQ - DOSED IN MG/24 HOURS) 14 mg/24hr patch Place 1 patch (14 mg total) onto the skin daily. 08/01/20   Hosie Poisson, MD  omeprazole (PRILOSEC) 20 MG capsule Take 2 capsules (40 mg total) by mouth daily. 08/01/20   Hosie Poisson, MD  OXYGEN Inhale 2 L into the lungs at bedtime.    [provider]  potassium chloride (KLOR-CON) 10 MEQ tablet Take 2 tablets (20 mEq total) by mouth daily. 08/08/19 12/09/19  Thornell Mule, MD  rosuvastatin (CRESTOR) 10 MG tablet Take 10 mg by mouth daily. 04/21/17   [provider]    Allergies Aspirin, Celebrex [celecoxib], Ciprofloxacin, Codeine, Fosphenytoin, Levaquin [levofloxacin in d5w], Levofloxacin, Lovastatin, Penicillins, Pravastatin, and Sulfa antibiotics  Family History  Problem Relation Age of Onset   Lung cancer Mother    CAD Father    Breast cancer Sister     Social History Social History   Tobacco Use   Smoking status: Every Day    Packs/day: 0.50    Years: 52.00    Pack years: 26.00    Types: Cigarettes    Start date: 03/01/1968   Smokeless tobacco: Never  Vaping Use   Vaping Use: Former  Substance Use Topics   Alcohol use: No   Drug use: No      Review of Systems Constitutional: No fever/chills Eyes: No visual changes. ENT: No sore throat. Cardiovascular: No chest pain Respiratory: Positive for SOB, cough Gastrointestinal: No abdominal pain.  No nausea, no vomiting.  No diarrhea.  No constipation. Genitourinary: Negative for dysuria. Musculoskeletal: Negative for back pain. Skin: Negative for rash. Neurological: Positive headache, no focal weakness or numbness. All other ROS negative ____________________________________________   PHYSICAL EXAM:  VITAL SIGNS: ED Triage Vitals  Enc Vitals Group     BP 04/27/21 1718 (!) 216/84     Pulse Rate 04/27/21 1718 62     Resp 04/27/21 1718 (!) 24      Temp 04/27/21 1718 98.4 F (36.9 C)     Temp Source 04/27/21 1718 Oral     SpO2 04/27/21 1718 93 %     Weight 04/27/21 1716 135 lb (61.2 kg)     Height 04/27/21 1716 _0  (1.6 m)     Head Circumference --      Peak Flow --      Pain Score 04/27/21 1716 3     Pain Loc --      Pain Edu? --      Excl. in [place]? --     Constitutional: Alert and oriented. Well appearing and in no acute distress. Eyes: Conjunctivae are normal. EOMI. Head: Atraumatic. Nose: No congestion/rhinnorhea. Mouth/Throat: Mucous membranes are moist.   Neck: No stridor. Trachea Midline. FROM Cardiovascular: Normal rate, regular rhythm. Grossly normal heart sounds.  Good peripheral circulation. Respiratory: No significant work of breathing at rest but patient is on 2 L of oxygen with oxygen level of 93%.  Wheezing noted bilaterally Gastrointestinal: Soft and nontender. No distention. No abdominal bruits.  Musculoskeletal: No lower extremity tenderness nor edema.  No joint effusions. Neurologic:  Normal speech and language. No gross focal neurologic deficits are appreciated.  Equal strength in arms and legs Skin:  Skin is warm, dry and intact. No rash noted. Psychiatric: Mood and affect are normal. Speech and behavior are normal. GU: Deferred   ____________________________________________   LABS (all labs ordered are listed, but only abnormal results are displayed)  Labs Reviewed  CBC WITH DIFFERENTIAL/PLATELET - Abnormal; Notable for the following components:      Result Value   RBC 5.36 (*)    All other components within normal limits  COMPREHENSIVE METABOLIC PANEL - Abnormal; Notable for the following components:   Potassium 2.4 (*)    Glucose, Bld 125 (*)    BUN 7 (*)    All other components within normal limits  BRAIN NATRIURETIC PEPTIDE - Abnormal; Notable for the following components:   B Natriuretic Peptide 385.8 (*)    All other components within normal limits  MAGNESIUM - Abnormal; Notable  for the following components:   Magnesium 1.0 (*)    All other components within normal limits  TROPONIN I (HIGH SENSITIVITY) - Abnormal; Notable for the following  components:   Troponin I (High Sensitivity) 23 (*)    All other components within normal limits  TROPONIN I (HIGH SENSITIVITY) - Abnormal; Notable for the following components:   Troponin I (High Sensitivity) 26 (*)    All other components within normal limits  RESP PANEL BY RT-PCR (FLU A&B, COVID) ARPGX2  URINALYSIS, COMPLETE (UACMP) WITH MICROSCOPIC   ____________________________________________   ED ECG REPORT I, Vanessa [place], the attending physician, personally viewed and interpreted this ECG.  Normal sinus rate of 64, no ST elevation, no T wave inversions, normal intervals ____________________________________________  RADIOLOGY Robert Bellow, personally viewed and evaluated these images (plain radiographs) as part of my medical decision making, as well as reviewing the written report by the radiologist.  ED MD interpretation: No pneumonia Official radiology report(s): DG Chest 2 View  Result Date: 04/27/2021 CLINICAL DATA:  Shortness of breath and cough EXAM: CHEST - 2 VIEW COMPARISON:  Chest x-ray 07/29/2020, CT chest 08/14/2017 FINDINGS: The heart and mediastinal contours are unchanged. Aortic calcification. No focal consolidation. No pulmonary edema. No pleural effusion. No pneumothorax. No acute osseous abnormality. Total reversed right shoulder arthroplasty. IMPRESSION: No active cardiopulmonary disease. Electronically Signed   By: Iven Finn M.D.   On: 04/27/2021 17:51   CT Head Wo Contrast  Result Date: 04/27/2021 CLINICAL DATA:  Headache, elevated blood pressure, cough, and shortness of breath. EXAM: CT HEAD WITHOUT CONTRAST TECHNIQUE: Contiguous axial images were obtained from the base of the skull through the vertex without intravenous contrast. COMPARISON:  CT head dated 07/29/2020. FINDINGS: Brain:  No evidence of acute infarction, hemorrhage, hydrocephalus, extra-axial collection or mass lesion/mass effect. Periventricular white matter hypoattenuation likely represents chronic small vessel ischemic disease. Vascular: There are vascular calcifications in the carotid siphons. Skull: Negative for fracture or focal lesion. Sinuses/Orbits: The patient is status post a left canal wall up mastoidectomy. The paranasal sinuses are clear. Other: None. IMPRESSION: No acute intracranial process. Electronically Signed   By: Zerita Boers M.D.   On: 04/27/2021 18:13    ____________________________________________   PROCEDURES  Procedure(s) performed (including Critical Care):  .1-3 Lead EKG Interpretation Performed by: Vanessa [place], MD Authorized by: Vanessa [place], MD     Interpretation: normal     ECG rate:  60s   ECG rate assessment: normal     Rhythm: sinus rhythm     Ectopy: none     Conduction: normal   .Critical Care Performed by: Vanessa [place], MD Authorized by: Vanessa [place], MD   Critical care provider statement:    Critical care time (minutes):  30   Critical care was necessary to treat or prevent imminent or life-threatening deterioration of the following conditions:  Respiratory failure   Critical care was time spent personally by me on the following activities:  Blood draw for specimens, development of treatment plan with patient or surrogate, discussions with consultants, discussions with primary provider, evaluation of patient's response to treatment, examination of patient, ordering and performing treatments and interventions, ordering and review of laboratory studies, ordering and review of radiographic studies, pulse oximetry, re-evaluation of patient's condition and review of old charts   I assumed direction of critical care for this patient from another provider in my specialty: no     Care discussed with: admitting provider      ____________________________________________   INITIAL IMPRESSION / [place] / ED COURSE   [name] was evaluated in Emergency Department on 04/27/2021 for the  symptoms described in the history of present illness. She was evaluated in the context of the global COVID-19 pandemic, which necessitated consideration that the patient might be at risk for infection with the SARS-CoV-2 virus that causes COVID-19. Institutional protocols and algorithms that pertain to the evaluation of patients at risk for COVID-19 are in a state of rapid change based on information released by regulatory bodies including the CDC and federal and state organizations. These policies and algorithms were followed during the patient's care in the ED.     Pt presents with SOB.  I saw this is from her COPD and being off of her albuterol for the past few weeks.  I given her some duo nebs and steroids but given her significant shortness of breath, coughing and hypoxia I will discuss hospital team for admission.  Her blood pressure is also elevated and she has a little bit of elevated troponin most likely from some hypertensive urgency and they can help manage that as well and patient.  Other differentials considered but less likely differential includes: PNA-will get xray to evaluation Anemia-CBC to evaluate ACS- will get trops Arrhythmia-Will get EKG and keep on monitor.  COVID- will get testing per algorithm. PE-lower suspicion given no risk factors and other cause more likely.  CT head ordered from triage was negative for any intracranial hemorrhage.  Cardiac marker slightly elevated which I suspect are secondary to her hypertension   A sign language interpreter was used for our conversation and both son and patient are agreement with admission due to the above.       ____________________________________________   FINAL CLINICAL IMPRESSION(S) / ED DIAGNOSES   Final diagnoses:  Chronic  obstructive pulmonary disease, unspecified COPD type ([place])  Hypokalemia  Secondary hypertension     MEDICATIONS GIVEN DURING THIS VISIT:  Medications  ipratropium-albuterol (DUONEB) 0.5-2.5 (3) MG/3ML nebulizer solution 3 mL (has no administration in time range)  ipratropium-albuterol (DUONEB) 0.5-2.5 (3) MG/3ML nebulizer solution 3 mL (has no administration in time range)  ipratropium-albuterol (DUONEB) 0.5-2.5 (3) MG/3ML nebulizer solution 3 mL (has no administration in time range)  methylPREDNISolone sodium succinate (SOLU-MEDROL) 125 mg/2 mL injection 125 mg (has no administration in time range)  potassium chloride 10 mEq in 100 mL IVPB (has no administration in time range)  potassium chloride SA (KLOR-CON) CR tablet 40 mEq (has no administration in time range)     ED Discharge Orders     None        Note:  This document was prepared using Dragon voice recognition software and may include unintentional dictation errors.   Vanessa [place], MD 04/27/21 2256

## 2021-04-27 NOTE — ED Triage Notes (Signed)
Pt via POV from home. Pt has multiple complaints including, SOB, cough, hypertension, and headache for the past couple of days. Pt states that she takes BP meds 2x a day and hasn't missed dose. Pt is A&Ox4 but is deaf at baseline. Pt able to read lips. Pt on RA is tachypneic and 86% on RA. Pt placed on 2L [place] and increased to 92%. Pt has a hx of COPD and wears 2L only at night

## 2021-04-28 ENCOUNTER — Inpatient Hospital Stay: Payer: Medicare Other

## 2021-04-28 ENCOUNTER — Encounter: Payer: Self-pay | Admitting: Family Medicine

## 2021-04-28 DIAGNOSIS — J441 Chronic obstructive pulmonary disease with (acute) exacerbation: Secondary | ICD-10-CM | POA: Diagnosis not present

## 2021-04-28 DIAGNOSIS — J9621 Acute and chronic respiratory failure with hypoxia: Secondary | ICD-10-CM

## 2021-04-28 DIAGNOSIS — K529 Noninfective gastroenteritis and colitis, unspecified: Secondary | ICD-10-CM

## 2021-04-28 DIAGNOSIS — G40919 Epilepsy, unspecified, intractable, without status epilepticus: Secondary | ICD-10-CM | POA: Diagnosis present

## 2021-04-28 DIAGNOSIS — E876 Hypokalemia: Secondary | ICD-10-CM | POA: Diagnosis not present

## 2021-04-28 DIAGNOSIS — G459 Transient cerebral ischemic attack, unspecified: Secondary | ICD-10-CM | POA: Diagnosis not present

## 2021-04-28 DIAGNOSIS — E875 Hyperkalemia: Secondary | ICD-10-CM | POA: Diagnosis not present

## 2021-04-28 DIAGNOSIS — F445 Conversion disorder with seizures or convulsions: Secondary | ICD-10-CM

## 2021-04-28 DIAGNOSIS — R1319 Other dysphagia: Secondary | ICD-10-CM

## 2021-04-28 DIAGNOSIS — F419 Anxiety disorder, unspecified: Secondary | ICD-10-CM

## 2021-04-28 DIAGNOSIS — R569 Unspecified convulsions: Secondary | ICD-10-CM | POA: Diagnosis not present

## 2021-04-28 DIAGNOSIS — I10 Essential (primary) hypertension: Secondary | ICD-10-CM | POA: Diagnosis not present

## 2021-04-28 DIAGNOSIS — R350 Frequency of micturition: Secondary | ICD-10-CM

## 2021-04-28 DIAGNOSIS — F32A Depression, unspecified: Secondary | ICD-10-CM

## 2021-04-28 LAB — BASIC METABOLIC PANEL
Anion gap: 8 (ref 5–15)
BUN: 9 mg/dL (ref 8–23)
CO2: 26 mmol/L (ref 22–32)
Calcium: 6.9 mg/dL — ABNORMAL LOW (ref 8.9–10.3)
Chloride: 109 mmol/L (ref 98–111)
Creatinine, Ser: 0.49 mg/dL (ref 0.44–1.00)
GFR, Estimated: >60 mL/min (ref >60)
Glucose, Bld: 246 mg/dL — ABNORMAL HIGH (ref 70–99)
Potassium: 5.4 mmol/L — ABNORMAL HIGH (ref 3.5–5.1)
Sodium: 143 mmol/L (ref 135–145)

## 2021-04-28 LAB — LIPID PANEL
Cholesterol: 120 mg/dL (ref 0–200)
HDL: 42 mg/dL (ref >40)
LDL Cholesterol: 66 mg/dL (ref 0–99)
Total CHOL/HDL Ratio: 2.9 RATIO
Triglycerides: 62 mg/dL (ref <150)
VLDL: 12 mg/dL (ref 0–40)

## 2021-04-28 LAB — CBG MONITORING, ED
Glucose-Capillary: 254 mg/dL — ABNORMAL HIGH (ref 70–99)
Glucose-Capillary: 275 mg/dL — ABNORMAL HIGH (ref 70–99)
Glucose-Capillary: 251 mg/dL — ABNORMAL HIGH (ref 70–99)
Glucose-Capillary: 235 mg/dL — ABNORMAL HIGH (ref 70–99)
Glucose-Capillary: 196 mg/dL — ABNORMAL HIGH (ref 70–99)

## 2021-04-28 LAB — CBC
HCT: 35.4 % — ABNORMAL LOW (ref 36.0–46.0)
Hemoglobin: 12.1 g/dL (ref 12.0–15.0)
MCH: 28.3 pg (ref 26.0–34.0)
MCHC: 34.2 g/dL (ref 30.0–36.0)
MCV: 82.9 fL (ref 80.0–100.0)
Platelets: 186 10*3/uL (ref 150–400)
RBC: 4.27 MIL/uL (ref 3.87–5.11)
RDW: 13.8 % (ref 11.5–15.5)
WBC: 3.7 10*3/uL — ABNORMAL LOW (ref 4.0–10.5)
nRBC: 0 % (ref 0.0–0.2)

## 2021-04-28 LAB — PHOSPHORUS: Phosphorus: 2.1 mg/dL — ABNORMAL LOW (ref 2.5–4.6)

## 2021-04-28 LAB — MAGNESIUM: Magnesium: 1.5 mg/dL — ABNORMAL LOW (ref 1.7–2.4)

## 2021-04-28 LAB — RESP PANEL BY RT-PCR (FLU A&B, COVID) ARPGX2
Influenza A by PCR: NEGATIVE
Influenza B by PCR: NEGATIVE
SARS Coronavirus 2 by RT PCR: NEGATIVE

## 2021-04-28 LAB — CREATININE, SERUM
Creatinine, Ser: 0.46 mg/dL (ref 0.44–1.00)
GFR, Estimated: >60 mL/min (ref >60)

## 2021-04-28 LAB — LACTIC ACID, PLASMA
Lactic Acid, Venous: 2.7 mmol/L (ref 0.5–1.9)
Lactic Acid, Venous: 2.2 mmol/L (ref 0.5–1.9)

## 2021-04-28 MED ORDER — INSULIN ASPART 100 UNIT/ML IJ SOLN
3.0000 [IU] | Freq: Three times a day (TID) | INTRAMUSCULAR | Status: DC
  Administered 2021-04-28 – 2021-05-01 (×10): 3 [IU] via SUBCUTANEOUS
  Filled 2021-04-28 (×10): qty 1

## 2021-04-28 MED ORDER — LEVETIRACETAM IN NACL 1000 MG/100ML IV SOLN
1000.0000 mg | Freq: Once | INTRAVENOUS | Status: AC
  Administered 2021-04-28: 1000 mg via INTRAVENOUS
  Filled 2021-04-28: qty 100

## 2021-04-28 MED ORDER — LEVETIRACETAM 500 MG PO TABS
500.0000 mg | ORAL_TABLET | Freq: Two times a day (BID) | ORAL | Status: DC
  Administered 2021-04-28 – 2021-05-01 (×7): 500 mg via ORAL
  Filled 2021-04-28 (×7): qty 1

## 2021-04-28 MED ORDER — INSULIN ASPART 100 UNIT/ML IJ SOLN
0.0000 [IU] | Freq: Every day | INTRAMUSCULAR | Status: DC
  Administered 2021-04-30: 2 [IU] via SUBCUTANEOUS
  Filled 2021-04-28: qty 1

## 2021-04-28 MED ORDER — SODIUM CHLORIDE 0.9 % IV SOLN
100.0000 mg | Freq: Two times a day (BID) | INTRAVENOUS | Status: DC
  Administered 2021-04-28 – 2021-04-29 (×4): 100 mg via INTRAVENOUS
  Filled 2021-04-28 (×6): qty 10

## 2021-04-28 MED ORDER — MAGNESIUM SULFATE 2 GM/50ML IV SOLN
2.0000 g | Freq: Once | INTRAVENOUS | Status: AC
  Administered 2021-04-28: 2 g via INTRAVENOUS
  Filled 2021-04-28: qty 50

## 2021-04-28 MED ORDER — INSULIN ASPART 100 UNIT/ML IJ SOLN
0.0000 [IU] | Freq: Three times a day (TID) | INTRAMUSCULAR | Status: DC
  Administered 2021-04-28: 5 [IU] via SUBCUTANEOUS
  Filled 2021-04-28: qty 1

## 2021-04-28 MED ORDER — STROKE: EARLY STAGES OF RECOVERY BOOK: Freq: Once | Status: DC

## 2021-04-28 MED ORDER — INSULIN ASPART 100 UNIT/ML IJ SOLN
3.0000 [IU] | Freq: Three times a day (TID) | INTRAMUSCULAR | Status: DC
  Administered 2021-04-28: 3 [IU] via SUBCUTANEOUS
  Filled 2021-04-28: qty 1

## 2021-04-28 MED ORDER — POTASSIUM CHLORIDE 20 MEQ PO PACK: 40.0000 meq | PACK | Freq: Once | ORAL | Status: DC

## 2021-04-28 MED ORDER — SODIUM PHOSPHATES 45 MMOLE/15ML IV SOLN
30.0000 mmol | Freq: Once | INTRAVENOUS | Status: AC
  Administered 2021-04-28: 30 mmol via INTRAVENOUS
  Filled 2021-04-28: qty 10

## 2021-04-28 MED ORDER — MIDAZOLAM HCL 2 MG/2ML IJ SOLN
INTRAMUSCULAR | Status: AC
  Administered 2021-04-28: 2 mg via INTRAVENOUS
  Filled 2021-04-28: qty 2

## 2021-04-28 MED ORDER — INSULIN ASPART 100 UNIT/ML IJ SOLN
0.0000 [IU] | Freq: Three times a day (TID) | INTRAMUSCULAR | Status: DC
  Administered 2021-04-28: 5 [IU] via SUBCUTANEOUS
  Administered 2021-04-29: 3 [IU] via SUBCUTANEOUS
  Administered 2021-04-29: 5 [IU] via SUBCUTANEOUS
  Administered 2021-04-30 (×3): 2 [IU] via SUBCUTANEOUS
  Administered 2021-05-01: 5 [IU] via SUBCUTANEOUS
  Filled 2021-04-28 (×7): qty 1

## 2021-04-28 MED ORDER — MIDAZOLAM HCL 2 MG/2ML IJ SOLN: 2.0000 mg | Freq: Once | INTRAMUSCULAR | Status: AC

## 2021-04-28 MED ORDER — LORAZEPAM 2 MG/ML IJ SOLN
1.0000 mg | INTRAMUSCULAR | Status: DC | PRN
  Administered 2021-04-28 – 2021-04-30 (×4): 1 mg via INTRAVENOUS
  Filled 2021-04-28 (×5): qty 1

## 2021-04-28 MED ORDER — LORAZEPAM 2 MG/ML IJ SOLN
1.0000 mg | Freq: Once | INTRAMUSCULAR | Status: AC
  Administered 2021-04-28: 1 mg via INTRAVENOUS
  Filled 2021-04-28: qty 1

## 2021-04-28 NOTE — ED Provider Notes (Addendum)
Called to see patient for seizures.  Patient was described as having head-bobbing and twitching all over.  She woke up was talking briefly although her slurred speech and almost immediately began with head bobbing and then began twitching all over.  Patient received 1 mg of Ativan IV.  Hospitalist arrived.  Patient is getting Vimpat did get her Keppra this morning.  I have ordered another gram of Keppra and although the order went in under the hospitalist name.  Additionally I have ordered another milligram of Ativan IV.  Patient has gotten magnesium IV as well.   Nena Polio, MD 04/28/21 1255 Hospitalist is currently at the bedside I will  turn patient management over to him.  Additionally he has paged neurology.   Nena Polio, MD 04/28/21 1256

## 2021-04-28 NOTE — ED Notes (Signed)
Pt sleeping, no signs of seizure activity noted. Pt switched to O2 4 L [place], pt sats at 92-93% on O2 4 L [name]

## 2021-04-28 NOTE — ED Notes (Signed)
Pt helped to bedside commode x2, pt with loose stool, unable to collect urine sample at this time, MD aware.  Pt needs full assist to transfer to bedside commode

## 2021-04-28 NOTE — Progress Notes (Addendum)
Critical care note:  Date of note: 04/28/2021  Subjective: While the patient was in the ER she developed seizures that I witnessed with shaking of the head and the upper torso and had postictal confusion.  She was opening her eyes and unresponsive during her seizures that was tonic-clonic.  No tongue biting.  No urinary or stool incontinence.  She was not moving her right side.  The patient O2 was increased and pulse oximetry was 94 to 98% on 6 L of O2 by nasal cannula up from 2 L.  Objective: Physical examination: .BP (!) 175/69   Pulse 64   Temp 98.6 F (37 C) (Oral)   Resp (!) 34   Ht _0  (1.6 m)   Wt 61.2 kg   SpO2 94%   BMI 23.91 kg/m    Generally: Acutely ill elderly Caucasian female who was actively in seizures with left gaze preference and not moving right side. Head - atraumatic, normocephalic.  Pupils - equal, round and reactive to light and accommodation. Extraocular movements are intact. No scleral icterus.  Oropharynx - moist mucous membranes and tongue.  No tongue bites no pharyngeal erythema or exudate.  Neck - supple. No JVD. Carotid pulses 2+ bilaterally. No carotid bruits. No palpable thyromegaly or lymphadenopathy. Cardiovascular - regular rate and rhythm. Normal S1 and S2. No murmurs, gallops or rubs.  Lungs - clear to auscultation bilaterally.  Abdomen - soft and nontender. Positive bowel sounds. No palpable organomegaly or masses.  Extremities - no pitting edema, clubbing or cyanosis.  Neuro -she was actively seizing with tonic-clonic seizure of the head and upper torso with no ability mobility of the right upper and lower extremity.  She had left gaze preference.  She was not cooperative with exam and has postictal confusion. Skin - no rashes. Breast, pelvic and rectal - deferred.  Labs and notes were reviewed.  Assessment/plan: 1.  Tonic-clonic seizure with postictal confusion, possibly associated with TIA, rule out evolving acute CVA with subsequent  right-sided hemiparesis.  The patient had associated acute on chronic hypoxic respiratory failure. - The patient was ordered a stat noncontrasted head CT scan that revealed no acute intracranial normality. - She was placed on seizures precautions. - She was given 2 mg of IV Versed. - Neurochecks will be followed every 4 hours for 24 hours. - We will obtain PT/OT and ST consults. - We will obtain an EEG. - She will be placed on as needed IV Ativan for seizures. - We will continue her lacosamide and IV form when she is not able to take p.o. - Neurology consult to be obtained. - I notified Dr.Bhagat about the patient. - Brain MRI without contrast, bilateral carotid Doppler as well as 2D echo with bubble study will be obtained. - We will place on aspirin and statin therapy. - Fasting lipids will be obtained in a.m.  2.  Hypokalemia and hypomagnesemia. - We will optimize potassium and magnesium replacement. - We will continue to closely monitor.  Authorized and performed by: Eugenie Norrie, MD Total critical care time: Approximately 30  minutes. Due to a high probability of clinically significant, life-threatening deterioration, the patient required my highest level of preparedness to intervene emergently and I personally spent this critical care time directly and personally managing the patient.  This critical care time included obtaining a history, examining the patient, pulse oximetry, ordering and review of studies, arranging urgent treatment with development of management plan, evaluation of patient's response to treatment, frequent reassessment, and  discussions with other providers. This critical care time was performed to assess and manage the high probability of imminent, life-threatening deterioration that could result in multiorgan failure.  It was exclusive of separately billable procedures and treating other patients and teaching time.

## 2021-04-28 NOTE — ED Notes (Signed)
Lab called to collect labs

## 2021-04-28 NOTE — Progress Notes (Signed)
PROGRESS NOTE  [name]:[id number] DOB: 1950/08/18   PCP: [facility] [place]  Patient is from: Home  DOA: 04/27/2021 LOS: 1  Chief complaints:  Chief Complaint  Patient presents with   Shortness of Breath   Hypertension   Headache     Brief Narrative / Interim history: 70 year old F with PMH of COPD/RF on 2 L at night, combined CHF, liver cirrhosis, DM-2, HTN, RA, depression, seizure disorder, CVA, chronic diarrhea and impaired hearing presenting with increased shortness of breath, cough, wheeze, markedly elevated blood pressure, headache, dizziness and blurry vision.  Initially admitted for COPD exacerbation with uncontrolled hypertension.  However, she had a breakthrough seizure and strokelike symptoms.  Neurology consulted.  CT head and MRI brain without acute finding.  Carotid ultrasound with 50 to 69% bilateral ICA stenosis.   Subjective: Seen and examined earlier this morning with the help of sign language interpreter with ID number [id number].  She reported lightheadedness and epigastric discomfort after swallowing.  She also reports right shoulder pain which is chronic after she had a fall about 2 years prior.  She denies chest pain and shortness of breath.  Admits to cough with clear phlegm occasionally.  She reports chronic diarrhea and frequent urination but denies abdominal pain.  Patient's son at bedside.  He is concerned about diarrhea driving hypokalemia.  She had evaluation by GI colonoscopy and endoscopy in April 2022.  Objective: Vitals:   04/28/21 0830 04/28/21 1145 04/28/21 1208 04/28/21 1210  BP: (!) 136/56  118/65   Pulse:  (!) 56 (!) 57   Resp:  (!) 21 16   Temp:    (!) 97.5 F (36.4 C)  TempSrc:    Oral  SpO2:  (!) 85% 93% 94%  Weight:      Height:        Intake/Output Summary (Last 24 hours) at 04/28/2021 1301 Last data filed at 04/28/2021 0402 Gross per 24 hour  Intake 307.33 ml  Output --  Net 307.33 ml   Filed Weights    04/27/21 1716  Weight: 61.2 kg    Examination:  GENERAL: No apparent distress.  Nontoxic. HEENT: MMM.  Vision grossly intact. NECK: Supple.  No apparent JVD.  RESP:  No IWOB.  Fair aeration bilaterally. CVS:  RRR. Heart sounds normal.  ABD/GI/GU: BS+. Abd soft, NTND.  MSK/EXT:  Moves extremities. No apparent deformity. No edema.  Decreased ROM in right shoulder SKIN: no apparent skin lesion or wound NEURO: Awake, alert and oriented appropriately.  No apparent focal neuro deficit. PSYCH: Calm. Normal affect.   Procedures:  None  Microbiology summarized: [id number] and influenza PCR nonreactive.  Assessment & Plan: Breakthrough seizure in patient with known history of seizure-concerned that this could be driving by electrolyte derangement.  CT head and MRI brain without acute finding.  Carotid ultrasound with 50 to 69% bilateral ICA stenosis.  Not sure how much this is contributing to his symptoms.  She also had markedly elevated BP.  Reportedly not taking Keppra at home. -Neurology consulted and following -Continue Vimpat -Replenish electrolytes as below -Seizure precaution -Ativan as needed for seizure lasting greater than 2 minutes    COPD exacerbation/chronic hypoxic RF-presented with increased shortness of breath, cough and wheeze.  Breathing has improved.  She is on 2 L by [place] at night -Continue antibiotics, systemic steroid and bronchodilators. -Wean oxygen as able  Hypokalemia/hypomagnesemia/hypophosphatemia-due to chronic diarrhea?  Lasix can contribute.  Hypokalemia resolved. -IV magnesium sulfate 2 g x 1 -IV  sodium phosphate 30 mmol x 1 -Recheck electrolytes in the morning  Hyperkalemia: Likely from supplementation. -Discontinue IV fluid with KCl  Uncontrolled hypertension: BP 216/84 on arrival.  Normotensive this morning despite IV fluid.  She only had low-dose Toprol-XL.  -Change IV NS-KCl to plain IV NS  NIDDM-2 with hyperglycemia: Hyperglycemia likely from  steroid.  On glimepiride at home. Recent Labs  Lab 04/28/21 0315 04/28/21 0735 04/28/21 1206  GLUCAP 254* 275* 251*  -Increase SSI to moderate -Add nightly coverage -Add mealtime coverage at 3 units -Follow hemoglobin A1c  Anxiety and depression -Continue home Celexa   Chronic diarrhea-evaluated for this by GI.  Colo showed polyps and nonbleeding internal hemorrhoid in 10/2020.  EGD the same time concerning for Barrett's esophagus, 2 cm hiatal hernia and gastric polyp.She tried Imodium and fiber without success per patient's son at bedside.  Exam benign to think of infectious etiology.  -Consider Lomotil  Lightheadedness-could be due to fluctuating blood pressure. -Management as above -Fall precaution  Frequent urination-chronic issue.  She denies dysuria or acute change. -Continue ceftriaxone -Follow urine culture  Esophageal dysphagia?  EGD in 10/2020 above.   -Continue PPI -May consider esophagram once seizure disorder under good control.  Tobacco use disorder-reports smoking about 4 to 5 cigarettes a day. -Encourage tobacco cessation -Nicotine patch while in-house  Impaired hearing/deaf -Used language interpreter with ID number [id number]  Body mass index is 23.91 kg/m.         DVT prophylaxis:  enoxaparin (LOVENOX) injection 40 mg Start: 04/28/21 0800  Code Status: Full code Family Communication: Updated patient's son at bedside. Level of care: Progressive Cardiac Status is: Inpatient  Remains inpatient appropriate because: Breakthrough seizure, electrolyte derangement   Consultants:  Neurology   Sch Meds:  Scheduled Meds:   stroke: mapping our early stages of recovery book   Does not apply Once   citalopram  20 mg Oral Daily   clopidogrel  75 mg Oral Daily   enoxaparin (LOVENOX) injection  40 mg Subcutaneous Q24H   insulin aspart  0-9 Units Subcutaneous TID WC   insulin aspart  3 Units Subcutaneous TID WC   ipratropium-albuterol  3 mL Nebulization QID    levETIRAcetam  500 mg Oral BID   losartan  25 mg Oral Daily   methylPREDNISolone (SOLU-MEDROL) injection  40 mg Intravenous Q12H   Followed by   Derrill Memo ON 04/29/2021] predniSONE  40 mg Oral Q breakfast   metoprolol succinate  25 mg Oral Q breakfast   montelukast  10 mg Oral QHS   nicotine  14 mg Transdermal Daily   pantoprazole  40 mg Oral Daily   potassium chloride  20 mEq Oral Daily   potassium chloride  40 mEq Oral Once   rosuvastatin  10 mg Oral Daily   Continuous Infusions:  cefTRIAXone (ROCEPHIN)  IV Stopped (04/28/21 0140)   lacosamide (VIMPAT) IV Stopped (04/28/21 1118)   levETIRAcetam     magnesium sulfate bolus IVPB     sodium phosphate  Dextrose 5% IVPB     PRN Meds:.acetaminophen **OR** acetaminophen, LORazepam, ondansetron **OR** ondansetron (ZOFRAN) IV, traZODone  Antimicrobials: Anti-infectives (From admission, onward)    Start     Dose/Rate Route Frequency Ordered Stop   04/27/21 2330  cefTRIAXone (ROCEPHIN) 1 g in sodium chloride 0.9 % 100 mL IVPB        1 g 200 mL/hr over 30 Minutes Intravenous Every 24 hours 04/27/21 2324 05/02/21 2329        I have  personally reviewed the following labs and images: CBC: Recent Labs  Lab 04/27/21 1729 04/28/21 1001  WBC 7.6 3.7*  NEUTROABS 4.3  --   HGB 15.0 12.1  HCT 44.3 35.4*  MCV 82.6 82.9  PLT 258 186   BMP &GFR Recent Labs  Lab 04/27/21 1729 04/28/21 1001  NA 142 143  K 2.4* 5.4*  CL 99 109  CO2 29 26  GLUCOSE 125* 246*  BUN 7* 9  CREATININE 0.56 0.49  0.46  CALCIUM 8.9 6.9*  MG 1.0* 1.5*  PHOS  --  2.1*   Estimated Creatinine Clearance: 54.9 mL/min (by C-G formula based on SCr of 0.46 mg/dL). Liver & Pancreas: Recent Labs  Lab 04/27/21 1729  AST 24  ALT 13  ALKPHOS 85  BILITOT 0.6  PROT 7.5  ALBUMIN 3.9   No results for input(s): LIPASE, AMYLASE in the last 168 hours. No results for input(s): AMMONIA in the last 168 hours. Diabetic: No results for input(s): HGBA1C in the last  72 hours. Recent Labs  Lab 04/28/21 0315 04/28/21 0735 04/28/21 1206  GLUCAP 254* 275* 251*   Cardiac Enzymes: No results for input(s): CKTOTAL, CKMB, CKMBINDEX, TROPONINI in the last 168 hours. No results for input(s): PROBNP in the last 8760 hours. Coagulation Profile: No results for input(s): INR, PROTIME in the last 168 hours. Thyroid Function Tests: No results for input(s): TSH, T4TOTAL, FREET4, T3FREE, THYROIDAB in the last 72 hours. Lipid Profile: Recent Labs    04/28/21 1001  CHOL 120  HDL 42  LDLCALC 66  TRIG 62  CHOLHDL 2.9   Anemia Panel: No results for input(s): VITAMINB12, FOLATE, FERRITIN, TIBC, IRON, RETICCTPCT in the last 72 hours. Urine analysis:    Component Value Date/Time   COLORURINE YELLOW (A) 07/29/2020 2146   APPEARANCEUR HAZY (A) 07/29/2020 2146   APPEARANCEUR Clear 11/14/2011 1607   LABSPEC 1.018 07/29/2020 2146   LABSPEC 1.006 11/14/2011 1607   PHURINE 5.0 07/29/2020 2146   GLUCOSEU NEGATIVE 07/29/2020 2146   GLUCOSEU Negative 11/14/2011 1607   HGBUR NEGATIVE 07/29/2020 2146   BILIRUBINUR NEGATIVE 07/29/2020 2146   BILIRUBINUR Negative 11/14/2011 1607   KETONESUR NEGATIVE 07/29/2020 2146   PROTEINUR NEGATIVE 07/29/2020 2146   NITRITE NEGATIVE 07/29/2020 2146   LEUKOCYTESUR NEGATIVE 07/29/2020 2146   LEUKOCYTESUR Negative 11/14/2011 1607   Sepsis Labs: Invalid input(s): PROCALCITONIN, [place]  Microbiology: Recent Results (from the past 240 hour(s))  Resp Panel by RT-PCR (Flu A&B, Covid) Nasopharyngeal Swab     Status: None   Collection Time: 04/27/21 11:33 PM   Specimen: Nasopharyngeal Swab; Nasopharyngeal(NP) swabs in vial transport medium  Result Value Ref Range Status   SARS Coronavirus 2 by RT PCR NEGATIVE NEGATIVE Final    Comment: (NOTE) SARS-CoV-2 target nucleic acids are NOT DETECTED.  The SARS-CoV-2 RNA is generally detectable in upper respiratory specimens during the acute phase of infection. The  lowest concentration of SARS-CoV-2 viral copies this assay can detect is 138 copies/mL. A negative result does not preclude SARS-Cov-2 infection and should not be used as the sole basis for treatment or other patient management decisions. A negative result may occur with  improper specimen collection/handling, submission of specimen other than nasopharyngeal swab, presence of viral mutation(s) within the areas targeted by this assay, and inadequate number of viral copies(<138 copies/mL). A negative result must be combined with clinical observations, patient history, and epidemiological information. The expected result is Negative.  Fact Sheet for Patients:  EntrepreneurPulse.com.au  Fact Sheet for Healthcare Providers:  IncredibleEmployment.be  This test is no t yet approved or cleared by the [place] and  has been authorized for detection and/or diagnosis of SARS-CoV-2 by FDA under an Emergency Use Authorization (EUA). This EUA will remain  in effect (meaning this test can be used) for the duration of the COVID-19 declaration under Section 564(b)(1) of the Act, 21 U.S.C.section 360bbb-3(b)(1), unless the authorization is terminated  or revoked sooner.       Influenza A by PCR NEGATIVE NEGATIVE Final   Influenza B by PCR NEGATIVE NEGATIVE Final    Comment: (NOTE) The Xpert Xpress SARS-CoV-2/FLU/RSV plus assay is intended as an aid in the diagnosis of influenza from Nasopharyngeal swab specimens and should not be used as a sole basis for treatment. Nasal washings and aspirates are unacceptable for Xpert Xpress SARS-CoV-2/FLU/RSV testing.  Fact Sheet for Patients: EntrepreneurPulse.com.au  Fact Sheet for Healthcare Providers: IncredibleEmployment.be  This test is not yet approved or cleared by the [place] FDA and has been authorized for detection and/or diagnosis of SARS-CoV-2 by FDA under  an Emergency Use Authorization (EUA). This EUA will remain in effect (meaning this test can be used) for the duration of the COVID-19 declaration under Section 564(b)(1) of the Act, 21 U.S.C. section 360bbb-3(b)(1), unless the authorization is terminated or revoked.  Performed at [facility], [address], [place], [place] [address]     Radiology Studies: DG Chest 2 View  Result Date: 04/27/2021 CLINICAL DATA:  Shortness of breath and cough EXAM: CHEST - 2 VIEW COMPARISON:  Chest x-ray 07/29/2020, CT chest 08/14/2017 FINDINGS: The heart and mediastinal contours are unchanged. Aortic calcification. No focal consolidation. No pulmonary edema. No pleural effusion. No pneumothorax. No acute osseous abnormality. Total reversed right shoulder arthroplasty. IMPRESSION: No active cardiopulmonary disease. Electronically Signed   By: Iven Finn M.D.   On: 04/27/2021 17:51   DG Abd 1 View  Result Date: 04/28/2021 CLINICAL DATA:  Shortness of breath, cough and hypertension. EXAM: ABDOMEN - 1 VIEW COMPARISON:  CT AP 02/06/2017 FINDINGS: Cholecystectomy clips identified. The bowel gas pattern is normal. No radio-opaque calculi or other significant radiographic abnormality are seen. IMPRESSION: Negative.  Nonobstructive bowel gas pattern. Electronically Signed   By: Kerby Moors M.D.   On: 04/28/2021 05:29   CT Head Wo Contrast  Result Date: 04/28/2021 CLINICAL DATA:  Cerebral hemorrhage suspected, headache, hypertension EXAM: CT HEAD WITHOUT CONTRAST TECHNIQUE: Contiguous axial images were obtained from the base of the skull through the vertex without intravenous contrast. COMPARISON:  04/27/2021 FINDINGS: Brain: No evidence of acute infarction, hemorrhage, cerebral edema, mass, mass effect, or midline shift. Ventricles and sulci are within normal limits for age. No extra-axial fluid collection. Periventricular white matter changes, likely the sequela of chronic small vessel ischemic  disease. Vascular: No hyperdense vessel. Skull: Normal. Negative for fracture or focal lesion. Sinuses/Orbits: No acute finding. Other: Status post left canal wall mastoidectomy. IMPRESSION: No acute intracranial process. Electronically Signed   By: Merilyn Baba M.D.   On: 04/28/2021 00:46   CT Head Wo Contrast  Result Date: 04/27/2021 CLINICAL DATA:  Headache, elevated blood pressure, cough, and shortness of breath. EXAM: CT HEAD WITHOUT CONTRAST TECHNIQUE: Contiguous axial images were obtained from the base of the skull through the vertex without intravenous contrast. COMPARISON:  CT head dated 07/29/2020. FINDINGS: Brain: No evidence of acute infarction, hemorrhage, hydrocephalus, extra-axial collection or mass lesion/mass effect. Periventricular white matter hypoattenuation likely represents chronic small vessel ischemic disease. Vascular: There are  vascular calcifications in the carotid siphons. Skull: Negative for fracture or focal lesion. Sinuses/Orbits: The patient is status post a left canal wall up mastoidectomy. The paranasal sinuses are clear. Other: None. IMPRESSION: No acute intracranial process. Electronically Signed   By: Zerita Boers M.D.   On: 04/27/2021 18:13   MR BRAIN WO CONTRAST  Result Date: 04/28/2021 CLINICAL DATA:  70 year old female with headache, hypertensive, seizure. EXAM: MRI HEAD WITHOUT CONTRAST TECHNIQUE: Multiplanar, multiecho pulse sequences of the brain and surrounding structures were obtained without intravenous contrast. COMPARISON:  Head CT 0024 hours today.  Brain MRI 07/31/2020. FINDINGS: Brain: Stable cerebral volume since January. No restricted diffusion to suggest acute infarction. No midline shift, mass effect, evidence of mass lesion, ventriculomegaly, extra-axial collection or acute intracranial hemorrhage. Cervicomedullary junction and pituitary are within normal limits. Stable gray and white matter signal throughout the brain. Patchy and confluent  bilateral white matter T2 and FLAIR hyperintensity. Mild to moderate patchy hyperintensity in the pons. No cortical encephalomalacia, chronic cerebral blood products, hippocampal or mesial temporal lobe asymmetry. Vascular: Major intracranial vascular flow voids are stable since January. Skull and upper cervical spine: Negative. Sinuses/Orbits: Postoperative changes to both globes, otherwise negative orbits. Mild left ethmoid sinus mucosal thickening has regressed since January. Other: Mastoids remain well aerated. Grossly normal visible internal auditory structures. Stable scalp soft tissues, left posterior convexity chronic scarring. IMPRESSION: No acute intracranial abnormality. Stable noncontrast MRI appearance of the brain since January with mild to moderate for age chronic white matter changes. Electronically Signed   By: Genevie Ann M.D.   On: 04/28/2021 06:26   US Carotid Bilateral (at [facility] and [facility] only)  Result Date: 04/28/2021 CLINICAL DATA:  Evaluate carotid arteries EXAM: BILATERAL CAROTID DUPLEX ULTRASOUND TECHNIQUE: Pearline Cables scale imaging, color Doppler and duplex ultrasound were performed of bilateral carotid and vertebral arteries in the neck. COMPARISON:  06/18/2020 FINDINGS: Criteria: Quantification of carotid stenosis is based on velocity parameters that correlate the residual internal carotid diameter with NASCET-based stenosis levels, using the diameter of the distal internal carotid lumen as the denominator for stenosis measurement. The following velocity measurements were obtained: RIGHT ICA: 163.7 cm/sec CCA: 20.3 cm/sec SYSTOLIC ICA/CCA RATIO:  2.3 ECA: 192.5 cm/sec LEFT ICA: 212.3 cm/sec CCA: 55.9 cm/sec SYSTOLIC ICA/CCA RATIO:  2.5 ECA: 326.2 cm/sec RIGHT CAROTID ARTERY: Plaque at the carotid bulb. RIGHT VERTEBRAL ARTERY:  Antegrade LEFT CAROTID ARTERY: Plaque in the distal CCA, in the bulb, and in the proximal ICA and ECA LEFT VERTEBRAL ARTERY:  Antegrade IMPRESSION: 1. 50-69% stenosis in the  bilateral ICAs. 2. Antegrade flow in vertebral arteries. Electronically Signed   By: Merilyn Baba M.D.   On: 04/28/2021 03:38      Teyton Pattillo T. [place]  If 7PM-7AM, please contact night-coverage [email or web address] 04/28/2021, 1:01 PM

## 2021-04-28 NOTE — ED Notes (Addendum)
Upon entering the pts room pt was unresponsive to pain and presenting with seizure like activity. MD Mansy at bedside gave verbal order for 28m of versed IV. Pt was immediately taken for a head CT without and placed on a non-rebreather due to low oxygenation saturation and a respiratory rate of 38.

## 2021-04-28 NOTE — ED Notes (Signed)
Pt with head bobbing and rapid eye blinking, pt progressed to full body tremors and seizure like activity. MD and EDP notified, MD and EDP at bedside. Ativan 1 mg given without change, pt continuing to tremor and have full body tremors. Ativan 1 mg repeated.  Keppra started per EDP.  Neurologist at bedside, pt with minor tremors, pt responsive at times.Pt with increased RR and decreased O2 sat. O2 increased to 5 L [place] but sats remained at less than 88%. Non rebreather applied, sats increased to 99%.  Per nuero hold ativan at this time due to possibility of pseudo seizures.  No vomiting noted, seizure pads in place

## 2021-04-28 NOTE — ED Notes (Signed)
Pt with sats of 88-89% on room air while sleeping, O2 2 L [place] applied, sats increased to 93-94%

## 2021-04-28 NOTE — Progress Notes (Signed)
PT Cancellation Note  Patient Details Name: BRINKLEY PEET MRN: [id number] DOB: [birth year]   Cancelled Treatment:    Reason Eval/Treat Not Completed: Patient not medically ready PT orders received, chart reviewed. Pt noted to have critically low K+, as well as low magnesium levels, and seizure in ED overnight. Exertional activity contraindicated & pt not appropriate for PT evaluation at this time. Will f/u as able & as pt is appropriate.   Lavone Nian, PT, DPT 04/28/21, 8:18 AM    Waunita Schooner 04/28/2021, 8:17 AM

## 2021-04-28 NOTE — Consult Note (Signed)
Neurology Consultation Reason for Consult: Seizure Requesting Physician: Wendee Beavers  CC: Urinary frequency and diarrhea per patient, low potassium per son  History is obtained from: Son, patient and chart review  HPI: [name] is a 70 y.o. female with a past medical history significant for deafness requiring ASL interpreter, seizures (on Vimpat), congestive heart failure, cirrhosis, COPD, depression, RLS, diabetes, hypertension, possible rheumatoid arthritis (not on immunosuppression on review of her medications with son).  She presented to the emergency room with elevated blood pressure despite reported adherence with her blood pressure medications, with headache and hypoxia to 86% and tachypnea on ambulation (out of her home albuterol) and she was also significantly hypokalemic to 2.4 and hypomagnesemic to 1.0, treated for concern for COPD exacerbation, with electrolyte repletion.  Subsequently she had shaking of the head and upper torso with no ability to move the right upper and lower extremity with a left gaze preference and postictal confusion, no documentation of length of the event, although she did receive midazolam 2 mg at midnight followed by Ativan 44m 2:30 AM  Last inpatient neurology evaluation was 07/30/2020 by Dr. ARory Percyfor breakthrough seizures and is previously followed by Dr. ADelice Leschhas noted focal seizures as well as possibilities of nonepileptic spells.  Hospitalizations and medical stressors are frequent triggers for her seizures, in particular her son notes that she is frequently hypokalemic as a trigger  She was last seen by Dr. STrena Plattoffice 03/18/2021 and noted she was having severe headaches felt to be secondary to her Vimpat with plan to gradually down titrate her dose to 100 mg twice daily.  Per review of her blister pack she is currently taking 150 mg twice daily and not taking any Keppra  ROS: All other review of systems was negative except as noted in the HPI and  the following:  She is concerned about green stools and green urine that happened to her intermittently and notes that she has a morning headache not infrequently.  She has had a recent sleep study but is only using oxygen at night not CPAP.  She notes her blood pressure has been elevated in the morning to the 200s since Thursday without a clear inciting etiology  Past Medical History:  Diagnosis Date   Asthma    CHF (congestive heart failure) (HCC)    Cirrhosis, non-alcoholic (HCC)    COPD (chronic obstructive pulmonary disease) ([place]    Deaf    Depression    Diabetes mellitus without complication (HCC)    GERD (gastroesophageal reflux disease)    Heart murmur    Hepatitis    History of rheumatic fever    History of scarlet fever    Hypertension    IBS (irritable bowel syndrome)    Lymph node disorder    arm   Neuropathy    On home oxygen therapy    hs   Orthopnea    Osteoarthritis    RA (rheumatoid arthritis) (HCC)    RLS (restless legs syndrome)    Seizures (HCC)    Shortness of breath dyspnea    Sleep apnea    [place]    tia   Past Surgical History:  Procedure Laterality Date   ABDOMINAL HYSTERECTOMY     BREAST BIOPSY Right 10/30/2020   Stereo Bx, X-clip, path pending    CATARACT EXTRACTION W/PHACO Right 11/23/2014   Procedure: CATARACT EXTRACTION PHACO AND INTRAOCULAR LENS PLACEMENT [place];  Surgeon: NLyla Glassing MD;  Location: [facility];  Service: Ophthalmology;  Laterality: Right;   CATARACT EXTRACTION W/PHACO Left 12/14/2014   Procedure: CATARACT EXTRACTION PHACO AND INTRAOCULAR LENS PLACEMENT (I[facility]);  Surgeon: Lyla Glassing, MD;  Location: [facility];  Service: Ophthalmology;  Laterality: Left;  US:01:16.6 AP:15.8 CDE:12.14   CESAREAN SECTION     CHOLECYSTECTOMY     COLONOSCOPY WITH PROPOFOL N/A 10/08/2020   Procedure: COLONOSCOPY WITH PROPOFOL;  Surgeon: Toledo, Benay Pike, MD;  Location: [facility];  Service: Gastroenterology;  Laterality: N/A;    ESOPHAGOGASTRODUODENOSCOPY (EGD) WITH PROPOFOL N/A 10/08/2020   Procedure: ESOPHAGOGASTRODUODENOSCOPY (EGD) WITH PROPOFOL;  Surgeon: Toledo, Benay Pike, MD;  Location: [facility];  Service: Gastroenterology;  Laterality: N/A;  DM DEAF, NEEDS SIGN INTERPRETER PER SON   REVERSE SHOULDER ARTHROPLASTY Right 12/21/2019   Procedure: REVERSE SHOULDER ARTHROPLASTY;  Surgeon: Lovell Sheehan, MD;  Location: [facility];  Service: Orthopedics;  Laterality: Right;   THUMB ARTHROSCOPY     TONSILLECTOMY     TYMPANOPLASTY     muliple    Current Facility-Administered Medications:     stroke: mapping our early stages of recovery book, , Does not apply, Once, Mansy, Jan A, MD   acetaminophen (TYLENOL) tablet 650 mg, 650 mg, Oral, Q6H PRN **OR** acetaminophen (TYLENOL) suppository 650 mg, 650 mg, Rectal, Q6H PRN, Mansy, Jan A, MD   cefTRIAXone (ROCEPHIN) 1 g in sodium chloride 0.9 % 100 mL IVPB, 1 g, Intravenous, Q24H, Mansy, Jan A, MD, Stopped at 04/28/21 0140   citalopram (CELEXA) tablet 20 mg, 20 mg, Oral, Daily, Mansy, Jan A, MD, 20 mg at 04/28/21 2703   clopidogrel (PLAVIX) tablet 75 mg, 75 mg, Oral, Daily, Mansy, Jan A, MD, 75 mg at 04/28/21 0926   enoxaparin (LOVENOX) injection 40 mg, 40 mg, Subcutaneous, Q24H, Mansy, Jan A, MD, 40 mg at 04/28/21 5009   insulin aspart (novoLOG) injection 0-9 Units, 0-9 Units, Subcutaneous, TID WC, Gonfa, Taye T, MD   insulin aspart (novoLOG) injection 3 Units, 3 Units, Subcutaneous, TID WC, Gonfa, Taye T, MD   ipratropium-albuterol (DUONEB) 0.5-2.5 (3) MG/3ML nebulizer solution 3 mL, 3 mL, Nebulization, QID, Mansy, Jan A, MD, 3 mL at 04/28/21 0805   lacosamide (VIMPAT) 100 mg in sodium chloride 0.9 % 25 mL IVPB, 100 mg, Intravenous, Q12H, Mansy, Jan A, MD, Stopped at 04/28/21 1118   levETIRAcetam (KEPPRA) tablet 500 mg, 500 mg, Oral, BID, Renda Rolls, RPH, 500 mg at 04/28/21 3818   LORazepam (ATIVAN) injection 1 mg, 1 mg, Intravenous, Q1H PRN, Mansy, Jan A, MD, 1 mg at  04/28/21 0236   losartan (COZAAR) tablet 25 mg, 25 mg, Oral, Daily, Mansy, Jan A, MD, 25 mg at 04/28/21 2993   methylPREDNISolone sodium succinate (SOLU-MEDROL) 40 mg/mL injection 40 mg, 40 mg, Intravenous, Q12H, 40 mg at 04/28/21 0806 **FOLLOWED BY** [START ON 04/29/2021] predniSONE (DELTASONE) tablet 40 mg, 40 mg, Oral, Q breakfast, Mansy, Jan A, MD   metoprolol succinate (TOPROL-XL) 24 hr tablet 25 mg, 25 mg, Oral, Q breakfast, Mansy, Jan A, MD, 25 mg at 04/28/21 0805   montelukast (SINGULAIR) tablet 10 mg, 10 mg, Oral, QHS, Mansy, Jan A, MD   nicotine (NICODERM CQ - dosed in mg/24 hours) patch 14 mg, 14 mg, Transdermal, Daily, Mansy, Jan A, MD, 14 mg at 04/28/21 0926   ondansetron (ZOFRAN) tablet 4 mg, 4 mg, Oral, Q6H PRN **OR** ondansetron (ZOFRAN) injection 4 mg, 4 mg, Intravenous, Q6H PRN, Mansy, Jan A, MD   pantoprazole (PROTONIX) EC tablet 40 mg, 40 mg, Oral, Daily, Mansy, Arvella Merles, MD,  40 mg at 04/28/21 0925   potassium chloride (KLOR-CON) CR tablet 20 mEq, 20 mEq, Oral, Daily, Mansy, Jan A, MD, 20 mEq at 04/28/21 1001   potassium chloride (KLOR-CON) packet 40 mEq, 40 mEq, Oral, Once, Mansy, Jan A, MD   rosuvastatin (CRESTOR) tablet 10 mg, 10 mg, Oral, Daily, Mansy, Jan A, MD, 10 mg at 04/28/21 0623   traZODone (DESYREL) tablet 25 mg, 25 mg, Oral, QHS PRN, Mansy, Arvella Merles, MD  Current Outpatient Medications:    albuterol (PROVENTIL HFA;VENTOLIN HFA) 108 (90 BASE) MCG/ACT inhaler, Inhale 2 puffs into the lungs every 4 (four) hours as needed for wheezing or shortness of breath., Disp: , Rfl:    citalopram (CELEXA) 10 MG tablet, Take 10 mg by mouth daily., Disp: , Rfl:    clopidogrel (PLAVIX) 75 MG tablet, Take 1 tablet (75 mg total) by mouth daily., Disp: 30 tablet, Rfl: 1   furosemide (LASIX) 40 MG tablet, Take 40 mg by mouth daily., Disp: , Rfl:    gabapentin (NEURONTIN) 100 MG capsule, Take 200 mg by mouth every evening., Disp: , Rfl:    glimepiride (AMARYL) 2 MG tablet, Take 2 mg by mouth  daily., Disp: , Rfl:    lacosamide 150 MG TABS, Take 1 tablet (150 mg total) by mouth 2 (two) times daily., Disp: 60 tablet, Rfl: 2   losartan (COZAAR) 100 MG tablet, Take 100 mg by mouth daily., Disp: , Rfl:    metoprolol succinate (TOPROL-XL) 25 MG 24 hr tablet, Take 1 tablet (25 mg total) by mouth daily., Disp: 60 tablet, Rfl: 0   montelukast (SINGULAIR) 10 MG tablet, Take 10 mg by mouth at bedtime. , Disp: , Rfl: 4   pantoprazole (PROTONIX) 40 MG tablet, Take 40 mg by mouth daily., Disp: , Rfl:    potassium chloride (KLOR-CON) 10 MEQ tablet, Take 2 tablets (20 mEq total) by mouth daily., Disp: 60 tablet, Rfl: 0   rOPINIRole (REQUIP) 0.5 MG tablet, Take 0.5 mg by mouth at bedtime., Disp: , Rfl:    rosuvastatin (CRESTOR) 10 MG tablet, Take 10 mg by mouth at bedtime., Disp: , Rfl: 5   SYMBICORT 160-4.5 MCG/ACT inhaler, Inhale 2 puffs into the lungs 2 (two) times daily., Disp: , Rfl:    acetaminophen (TYLENOL) 325 MG tablet, Take 2 tablets (650 mg total) by mouth every 6 (six) hours as needed for mild pain (or Fever >/= 101). (Patient not taking: No sig reported), Disp: 60 tablet, Rfl: 1   albuterol (PROVENTIL) (2.5 MG/3ML) 0.083% nebulizer solution, Take 3 mLs (2.5 mg total) by nebulization every 4 (four) hours as needed for wheezing or shortness of breath. (Patient not taking: Reported on 04/28/2021), Disp: 90 mL, Rfl: 5   guaiFENesin (MUCINEX) 600 MG 12 hr tablet, Take 2 tablets (1,200 mg total) by mouth 2 (two) times daily. (Patient not taking: Reported on 04/28/2021), Disp: 30 tablet, Rfl: 0   nicotine (NICODERM CQ - DOSED IN MG/24 HOURS) 14 mg/24hr patch, Place 1 patch (14 mg total) onto the skin daily. (Patient not taking: No sig reported), Disp: 28 patch, Rfl: 0   omeprazole (PRILOSEC) 20 MG capsule, Take 2 capsules (40 mg total) by mouth daily. (Patient not taking: No sig reported), Disp: 30 capsule, Rfl: 1   Family History  Problem Relation Age of Onset   Lung cancer Mother    CAD  Father    Breast cancer Sister    Social History:  reports that she has been smoking cigarettes. She started  smoking about 53 years ago. She has a 26.00 pack-year smoking history. She has never used smokeless tobacco. She reports that she does not drink alcohol and does not use drugs.   Exam: Current vital signs: BP (!) 130/49   Pulse (!) 55   Temp 98.6 F (37 C) (Oral)   Resp (!) 23   Ht _0  (1.6 m)   Wt 61.2 kg   SpO2 95%   BMI 23.91 kg/m  Vital signs in last 24 hours: Temp:  [98.4 F (36.9 C)-98.6 F (37 C)] 98.6 F (37 C) (10/23 0100) Pulse Rate:  [55-70] 55 (10/23 0800) Resp:  [23-45] 23 (10/23 0800) BP: (130-216)/(49-84) 130/49 (10/23 0800) SpO2:  [93 %-99 %] 95 % (10/23 0800) Weight:  [61.2 kg] 61.2 kg (10/22 1716)   Physical Exam  Constitutional: Appears well-developed and well-nourished.  Psych: Affect appropriate to situation, calm and cooperative Eyes: No scleral injection HENT: No oropharyngeal obstruction.  MSK: no joint deformities.  Cardiovascular: Mildly bradycardic in the 50s, but perfusing extremities well Respiratory: Effort normal, non-labored breathing, on nasal cannula GI: Soft.  No distension. There is no tenderness.  Skin: Warm dry and intact visible skin  Neuro: Mental Status: Patient is awake, alert, oriented to person, place, month, year, but not situation (does not remember his seizure) or day of the week Patient is able to give a clear and coherent history corroborated by son No signs of aphasia or neglect Cranial Nerves: II: Visual Fields are full. Pupils are equal, round, and reactive to light.   III,IV, VI: EOMI without ptosis or diploplia.  V: Facial sensation is symmetric to temperature VII: Facial movement is symmetric.  VIII: hearing is intact to voice X: Uvula elevates symmetrically XI: Shoulder shrug is symmetric. XII: tongue is midline without atrophy or fasciculations.  Motor: Tone is normal. Bulk is normal.  Right  upper extremity is pain limited at the shoulder, but does not have any drift.  She is able to ambulate with some leg tremor to bedside commode Sensory: Reports reduced sensation in the right arm and leg to light touch and temperature Deep Tendon Reflexes: Slightly increased reflexes on the right compared to the left but both 2+ Cerebellar: Finger-nose is intact bilaterally   I have reviewed labs in epic and the results pertinent to this consultation are:  Basic Metabolic Panel: Recent Labs  Lab 04/27/21 1729 04/28/21 1001  NA 142 143  K 2.4* 5.4*  CL 99 109  CO2 29 26  GLUCOSE 125* 246*  BUN 7* 9  CREATININE 0.56 0.49  0.46  CALCIUM 8.9 6.9*  MG 1.0* 1.5*  PHOS  --  2.1*    CBC: Recent Labs  Lab 04/27/21 1729 04/28/21 1001  WBC 7.6 3.7*  NEUTROABS 4.3  --   HGB 15.0 12.1  HCT 44.3 35.4*  MCV 82.6 82.9  PLT 258 186    Coagulation Studies: No results for input(s): LABPROT, INR in the last 72 hours.    I have reviewed the images obtained:   MRI brain personally reviewed, agree with radiology that this is a stable examination without acute intracranial process  Impression: Breakthrough seizures in the setting of severe electrolyte derangements.  While there has been concern for seizure versus pseudoseizure in the past, the description of the event with gaze preference and gradually resolving right-sided weakness/numbness is concerning for focal seizure arising from the left hemisphere (though would have expected an initial right gaze preference followed by postictal left gaze  preference).  Further inpatient neurological work-up is not needed as the inciting event is clear.  However to prevent further seizures, elucidation of the etiology of her electrolyte derangements and correction of the same will be critical.   Recommendations: -In addition to morning labs today please recheck magnesium and phosphorus (ordered) and supplement aggressively as needed -Morning  magnesium resulted at 1.5, repletion of 2 g IV ordered -Repletion of phosphorus per primary team -Ordered every 12 hours BMP, magnesium and phosphorus, with further repletion per primary team -Continue home Vimpat 150 mg twice daily -Discontinuing Keppra as patient is not taking this medication currently -No indication for EEG, this has been canceled -Appreciate management of urinary frequency, diarrhea per primary team as these are likely driving her electrolyte derangements and lowering her seizure threshold -Appreciate discussion of patient's green urine and stool with patient/family per primary team -Neurology will be available on an as-needed basis going forward, please reach out if any questions or concerns arise  Lesleigh Noe MD-PhD [facility] [phone] [facility] coverage for [facility] is from 8 AM to 4 AM in-house and 4 PM to 8 PM by telephone/video. 8 PM to 8 AM emergent questions or overnight urgent questions should be addressed to Teleneurology On-call or [facility] Pontes neurohospitalist; contact information can be found on AMION

## 2021-04-28 NOTE — Progress Notes (Signed)
Patient had activity concerning for pseudoseizure with tremoring of different extremities variable frequency/amplitude, able to react and respond to stimuli, able to follow some simple commands.  Frequent blinking but responds readily to noxious stimuli and to calling her name.  No focality on motor exam and at this time not reporting any focality to sensory exam either.  She had received 2 mg of Ativan and 1 g of Keppra.  Discussed with bedside nursing, conservative management for events such as this which are consistent with pseudoseizure, high risk for hypoxia in the setting of likely untreated obstructive sleep apnea with further benzodiazepines.  Discussed with hospitalist, okay to resume Keppra as well with anticipation that this can be weaned on an outpatient basis.  Neurology will follow for spell management.  Lesleigh Noe MD-PhD [facility] [phone]  [facility] coverage for [employer] is from 8 AM to 4 AM in-house and 4 PM to 8 PM by telephone/video. 8 PM to 8 AM emergent questions or overnight urgent questions should be addressed to Teleneurology On-call or [facility] Pontes neurohospitalist; contact information can be found on AMION

## 2021-04-28 NOTE — Progress Notes (Signed)
OT Cancellation Note  Patient Details Name: AVALIE OCONNOR MRN: [id number] DOB: 1951-05-24   Cancelled Treatment:    Reason Eval/Treat Not Completed: Medical issues which prohibited therapy. OT orders received. Per chart review, pt noted to have critically low K+, as well as low magnesium levels, and seizure in ED overnight. Exertional activity contraindicated & pt not appropriate for OT evaluation at this time. Will f/u as able & as pt is appropriate.   Fredirick Maudlin, OTR/L [facility] [place]

## 2021-04-28 NOTE — ED Notes (Signed)
ED TO INPATIENT HANDOFF REPORT  ED Nurse Name and Phone #: [phone]  S Name/Age/Gender [name] 70 y.o. female Room/Bed: ED35A/ED35A  Code Status   Code Status: Full Code  Home/SNF/Other Home Patient oriented to: self, place, time, and situation Is this baseline? Yes   Triage Complete: Triage complete  Chief Complaint COPD exacerbation ([place]) [J44.1] Breakthrough seizure ([place]) [G40.919]  Triage Note Pt via POV from home. Pt has multiple complaints including, SOB, cough, hypertension, and headache for the past couple of days. Pt states that she takes BP meds 2x a day and hasn't missed dose. Pt is A&Ox4 but is deaf at baseline. Pt able to read lips. Pt on RA is tachypneic and 86% on RA. Pt placed on 2L [place] and increased to 92%. Pt has a hx of COPD and wears 2L only at night   Allergies Allergies  Allergen Reactions   Aspirin Itching   Celebrex [Celecoxib] Itching    itching   Ciprofloxacin Itching   Codeine Itching   Fosphenytoin Itching   Levaquin [Levofloxacin In D5w] Itching   Levofloxacin Itching   Lovastatin Itching   Penicillins     Documentation indicates severe reaction  Pt tolerated cephalosporin without adverse reaction 09/18    Pravastatin Itching   Sulfa Antibiotics Itching    Level of Care/Admitting Diagnosis ED Disposition     ED Disposition  Admit   Condition  --   [place]: [place] [[id number]]  Level of Care: Progressive Cardiac [[id number]]  Admit to Progressive based on following criteria: NEUROLOGICAL AND NEUROSURGICAL complex patients with significant risk of instability, who do not meet ICU criteria, yet require close observation or frequent assessment (< / = every 2 - 4 hours) with medical / nursing intervention.  Admit to Progressive based on following criteria: CARDIOVASCULAR & THORACIC of moderate stability with acute coronary syndrome symptoms/low risk myocardial infarction/hypertensive  urgency/arrhythmias/heart failure potentially compromising stability and stable post cardiovascular intervention patients.  Covid Evaluation: Confirmed COVID Negative  Diagnosis: Breakthrough seizure [facility]) [[id number]]  Admitting Physician: Mercy Riding [[id number]]  Attending Physician: Mercy Riding [[id number]]  Estimated length of stay: past midnight tomorrow  Certification:: I certify this patient will need inpatient services for at least 2 midnights          B Medical/Surgery History Past Medical History:  Diagnosis Date   Asthma    CHF (congestive heart failure) ([place])    Cirrhosis, non-alcoholic ([place])    COPD (chronic obstructive pulmonary disease) ([place])    Deaf    Depression    Diabetes mellitus without complication ([place])    GERD (gastroesophageal reflux disease)    Heart murmur    Hepatitis    History of rheumatic fever    History of scarlet fever    Hypertension    IBS (irritable bowel syndrome)    Lymph node disorder    arm   Neuropathy    On home oxygen therapy    hs   Orthopnea    Osteoarthritis    RA (rheumatoid arthritis) (H[facility])    RLS (restless legs syndrome)    Seizures (HCC)    Shortness of breath dyspnea    Sleep apnea    Stroke ([place])    tia   Past Surgical History:  Procedure Laterality Date   ABDOMINAL HYSTERECTOMY     BREAST BIOPSY Right 10/30/2020   Stereo Bx, X-clip, path pending    CATARACT EXTRACTION W/PHACO Right 11/23/2014   Procedure:  CATARACT EXTRACTION PHACO AND INTRAOCULAR LENS PLACEMENT (IOC);  Surgeon: Lyla Glassing, MD;  Location: [facility] ORS;  Service: Ophthalmology;  Laterality: Right;   CATARACT EXTRACTION W/PHACO Left 12/14/2014   Procedure: CATARACT EXTRACTION PHACO AND INTRAOCULAR LENS PLACEMENT (IOC);  Surgeon: Lyla Glassing, MD;  Location: [facility] ORS;  Service: Ophthalmology;  Laterality: Left;  US:01:16.6 AP:15.8 CDE:12.14   CESAREAN SECTION     CHOLECYSTECTOMY     COLONOSCOPY WITH PROPOFOL N/A 10/08/2020   Procedure: COLONOSCOPY  WITH PROPOFOL;  Surgeon: Toledo, Benay Pike, MD;  Location: [facility] ENDOSCOPY;  Service: Gastroenterology;  Laterality: N/A;   ESOPHAGOGASTRODUODENOSCOPY (EGD) WITH PROPOFOL N/A 10/08/2020   Procedure: ESOPHAGOGASTRODUODENOSCOPY (EGD) WITH PROPOFOL;  Surgeon: Toledo, Benay Pike, MD;  Location: [facility] ENDOSCOPY;  Service: Gastroenterology;  Laterality: N/A;  DM DEAF, NEEDS SIGN INTERPRETER PER SON   REVERSE SHOULDER ARTHROPLASTY Right 12/21/2019   Procedure: REVERSE SHOULDER ARTHROPLASTY;  Surgeon: Lovell Sheehan, MD;  Location: [facility] ORS;  Service: Orthopedics;  Laterality: Right;   THUMB ARTHROSCOPY     TONSILLECTOMY     TYMPANOPLASTY     muliple     A IV Location/Drains/Wounds Patient Lines/Drains/Airways Status     Active Line/Drains/Airways     Name Placement date Placement time Site Days   Peripheral IV 04/27/21 20 G Anterior;Left;Proximal Forearm 04/27/21  2251  Forearm  1            Intake/Output Last 24 hours  Intake/Output Summary (Last 24 hours) at 04/28/2021 2131 Last data filed at 04/28/2021 0402 Gross per 24 hour  Intake 307.33 ml  Output --  Net 307.33 ml    Labs/Imaging Results for orders placed or performed during the hospital encounter of 04/27/21 (from the past 48 hour(s))  CBC with Differential     Status: Abnormal   Collection Time: 04/27/21  5:29 PM  Result Value Ref Range   WBC 7.6 4.0 - 10.5 K/uL   RBC 5.36 (H) 3.87 - 5.11 MIL/uL   Hemoglobin 15.0 12.0 - 15.0 g/dL   HCT 44.3 36.0 - 46.0 %   MCV 82.6 80.0 - 100.0 fL   MCH 28.0 26.0 - 34.0 pg   MCHC 33.9 30.0 - 36.0 g/dL   RDW 13.8 11.5 - 15.5 %   Platelets 258 150 - 400 K/uL   nRBC 0.0 0.0 - 0.2 %   Neutrophils Relative % 56 %   Neutro Abs 4.3 1.7 - 7.7 K/uL   Lymphocytes Relative 35 %   Lymphs Abs 2.6 0.7 - 4.0 K/uL   Monocytes Relative 7 %   Monocytes Absolute 0.5 0.1 - 1.0 K/uL   Eosinophils Relative 1 %   Eosinophils Absolute 0.1 0.0 - 0.5 K/uL   Basophils Relative 1 %   Basophils Absolute  0.0 0.0 - 0.1 K/uL   Immature Granulocytes 0 %   Abs Immature Granulocytes 0.02 0.00 - 0.07 K/uL    Comment: Performed at [facility], [address] [place], [place] [address]  Comprehensive metabolic panel     Status: Abnormal   Collection Time: 04/27/21  5:29 PM  Result Value Ref Range   Sodium 142 135 - 145 mmol/L   Potassium 2.4 (LL) 3.5 - 5.1 mmol/L    Comment: CRITICAL RESULT CALLED TO, READ BACK BY AND VERIFIED WITH: Gwynn Burly RN AT 4166 04/27/21. MSS    Chloride 99 98 - 111 mmol/L   CO2 29 22 - 32 mmol/L   Glucose, Bld 125 (H) 70 - 99 mg/dL  Comment: Glucose reference range applies only to samples taken after fasting for at least 8 hours.   BUN 7 (L) 8 - 23 mg/dL   Creatinine, Ser 0.56 0.44 - 1.00 mg/dL   Calcium 8.9 8.9 - 10.3 mg/dL   Total Protein 7.5 6.5 - 8.1 g/dL   Albumin 3.9 3.5 - 5.0 g/dL   AST 24 15 - 41 U/L   ALT 13 0 - 44 U/L   Alkaline Phosphatase 85 38 - 126 U/L   Total Bilirubin 0.6 0.3 - 1.2 mg/dL   GFR, Estimated >60 >60 mL/min    Comment: (NOTE) Calculated using the CKD-EPI Creatinine Equation (2021)    Anion gap 14 5 - 15    Comment: Performed at [facility], [address] [place], [place] [address]  Troponin I (High Sensitivity)     Status: Abnormal   Collection Time: 04/27/21  5:29 PM  Result Value Ref Range   Troponin I (High Sensitivity) 23 (H) <18 ng/L    Comment: CRITICAL RESULT CALLED TO, READ BACK BY AND VERIFIED WITH JANE RYAN AT 1825 04/27/21. MSS (NOTE) Elevated high sensitivity troponin I (hsTnI) values and significant  changes across serial measurements may suggest ACS but many other  chronic and acute conditions are known to elevate hsTnI results.  Refer to the "Links" section for chest pain algorithms and additional  guidance. Performed at [facility], [address] [place], [place] [address]   Brain natriuretic peptide     Status: Abnormal   Collection Time: 04/27/21  5:29 PM  Result  Value Ref Range   B Natriuretic Peptide 385.8 (H) 0.0 - 100.0 pg/mL    Comment: Performed at [facility], [address] [place], [place] [address]  Magnesium     Status: Abnormal   Collection Time: 04/27/21  5:29 PM  Result Value Ref Range   Magnesium 1.0 (L) 1.7 - 2.4 mg/dL    Comment: Performed at [facility], [place], [place] [address]  Troponin I (High Sensitivity)     Status: Abnormal   Collection Time: 04/27/21  7:17 PM  Result Value Ref Range   Troponin I (High Sensitivity) 26 (H) <18 ng/L    Comment: (NOTE) Elevated high sensitivity troponin I (hsTnI) values and significant  changes across serial measurements may suggest ACS but many other  chronic and acute conditions are known to elevate hsTnI results.  Refer to the "Links" section for chest pain algorithms and additional  guidance. Performed at [facility], [address] [place], [place] [address]   Resp Panel by RT-PCR (Flu A&B, Covid) Nasopharyngeal Swab     Status: None   Collection Time: 04/27/21 11:33 PM   Specimen: Nasopharyngeal Swab; Nasopharyngeal(NP) swabs in vial transport medium  Result Value Ref Range   SARS Coronavirus 2 by RT PCR NEGATIVE NEGATIVE    Comment: (NOTE) SARS-CoV-2 target nucleic acids are NOT DETECTED.  The SARS-CoV-2 RNA is generally detectable in upper respiratory specimens during the acute phase of infection. The lowest concentration of SARS-CoV-2 viral copies this assay can detect is 138 copies/mL. A negative result does not preclude SARS-Cov-2 infection and should not be used as the sole basis for treatment or other patient management decisions. A negative result may occur with  improper specimen collection/handling, submission of specimen other than nasopharyngeal swab, presence of viral mutation(s) within the areas targeted by this assay, and inadequate number of viral copies(<138 copies/mL). A negative result must be  combined  with clinical observations, patient history, and epidemiological information. The expected result is Negative.  Fact Sheet for Patients:  EntrepreneurPulse.com.au  Fact Sheet for Healthcare Providers:  IncredibleEmployment.be  This test is no t yet approved or cleared by the [place] FDA and  has been authorized for detection and/or diagnosis of SARS-CoV-2 by FDA under an Emergency Use Authorization (EUA). This EUA will remain  in effect (meaning this test can be used) for the duration of the COVID-19 declaration under Section 564(b)(1) of the Act, 21 U.S.C.section 360bbb-3(b)(1), unless the authorization is terminated  or revoked sooner.       Influenza A by PCR NEGATIVE NEGATIVE   Influenza B by PCR NEGATIVE NEGATIVE    Comment: (NOTE) The Xpert Xpress SARS-CoV-2/FLU/RSV plus assay is intended as an aid in the diagnosis of influenza from Nasopharyngeal swab specimens and should not be used as a sole basis for treatment. Nasal washings and aspirates are unacceptable for Xpert Xpress SARS-CoV-2/FLU/RSV testing.  Fact Sheet for Patients: EntrepreneurPulse.com.au  Fact Sheet for Healthcare Providers: IncredibleEmployment.be  This test is not yet approved or cleared by the [place] FDA and has been authorized for detection and/or diagnosis of SARS-CoV-2 by FDA under an Emergency Use Authorization (EUA). This EUA will remain in effect (meaning this test can be used) for the duration of the COVID-19 declaration under Section 564(b)(1) of the Act, 21 U.S.C. section 360bbb-3(b)(1), unless the authorization is terminated or revoked.  Performed at [facility], [address] [place], [place] [address]   CBG monitoring, ED     Status: Abnormal   Collection Time: 04/28/21  3:15 AM  Result Value Ref Range   Glucose-Capillary 254 (H) 70 - 99 mg/dL    Comment: Glucose reference range  applies only to samples taken after fasting for at least 8 hours.  CBG monitoring, ED     Status: Abnormal   Collection Time: 04/28/21  7:35 AM  Result Value Ref Range   Glucose-Capillary 275 (H) 70 - 99 mg/dL    Comment: Glucose reference range applies only to samples taken after fasting for at least 8 hours.  Creatinine, serum     Status: None   Collection Time: 04/28/21 10:01 AM  Result Value Ref Range   Creatinine, Ser 0.46 0.44 - 1.00 mg/dL   GFR, Estimated >60 >60 mL/min    Comment: (NOTE) Calculated using the CKD-EPI Creatinine Equation (2021) Performed at [facility], [address] [place], [place] [address]   Basic metabolic panel     Status: Abnormal   Collection Time: 04/28/21 10:01 AM  Result Value Ref Range   Sodium 143 135 - 145 mmol/L   Potassium 5.4 (H) 3.5 - 5.1 mmol/L   Chloride 109 98 - 111 mmol/L   CO2 26 22 - 32 mmol/L   Glucose, Bld 246 (H) 70 - 99 mg/dL    Comment: Glucose reference range applies only to samples taken after fasting for at least 8 hours.   BUN 9 8 - 23 mg/dL   Creatinine, Ser 0.49 0.44 - 1.00 mg/dL   Calcium 6.9 (L) 8.9 - 10.3 mg/dL   GFR, Estimated >60 >60 mL/min    Comment: (NOTE) Calculated using the CKD-EPI Creatinine Equation (2021)    Anion gap 8 5 - 15    Comment: Performed at [facility], [address], [place], [place] [address]  CBC     Status: Abnormal   Collection Time: 04/28/21 10:01 AM  Result  Value Ref Range   WBC 3.7 (L) 4.0 - 10.5 K/uL   RBC 4.27 3.87 - 5.11 MIL/uL   Hemoglobin 12.1 12.0 - 15.0 g/dL   HCT 35.4 (L) 36.0 - 46.0 %   MCV 82.9 80.0 - 100.0 fL   MCH 28.3 26.0 - 34.0 pg   MCHC 34.2 30.0 - 36.0 g/dL   RDW 13.8 11.5 - 15.5 %   Platelets 186 150 - 400 K/uL   nRBC 0.0 0.0 - 0.2 %    Comment: Performed at [facility], [address] [place], [place] [address]  Lipid panel     Status: None   Collection Time: 04/28/21 10:01 AM  Result Value Ref Range   Cholesterol 120 0 -  200 mg/dL   Triglycerides 62 <150 mg/dL   HDL 42 >40 mg/dL   Total CHOL/HDL Ratio 2.9 RATIO   VLDL 12 0 - 40 mg/dL   LDL Cholesterol 66 0 - 99 mg/dL    Comment:        Total Cholesterol/HDL:CHD Risk Coronary Heart Disease Risk Table                     Men   Women  1/2 Average Risk   3.4   3.3  Average Risk       5.0   4.4  2 X Average Risk   9.6   7.1  3 X Average Risk  23.4   11.0        Use the calculated Patient Ratio above and the CHD Risk Table to determine the patient's CHD Risk.        ATP III CLASSIFICATION (LDL):  <100     mg/dL   Optimal  100-129  mg/dL   Near or Above                    Optimal  130-159  mg/dL   Borderline  160-189  mg/dL   High  >190     mg/dL   Very High Performed at [facility], [address] [place], [place] [address]   Magnesium     Status: Abnormal   Collection Time: 04/28/21 10:01 AM  Result Value Ref Range   Magnesium 1.5 (L) 1.7 - 2.4 mg/dL    Comment: Performed at [facility], [address] [place], [place] [address]  Phosphorus     Status: Abnormal   Collection Time: 04/28/21 10:01 AM  Result Value Ref Range   Phosphorus 2.1 (L) 2.5 - 4.6 mg/dL    Comment: Performed at [facility], [address] [place], [place] [address]  CBG monitoring, ED     Status: Abnormal   Collection Time: 04/28/21 12:06 PM  Result Value Ref Range   Glucose-Capillary 251 (H) 70 - 99 mg/dL    Comment: Glucose reference range applies only to samples taken after fasting for at least 8 hours.  Lactic acid, plasma     Status: Abnormal   Collection Time: 04/28/21  2:35 PM  Result Value Ref Range   Lactic Acid, Venous 2.7 (HH) 0.5 - 1.9 mmol/L    Comment: CRITICAL RESULT CALLED TO, READ BACK BY AND VERIFIED WITH MAUREEN TUMEY AT 1511 04/28/21.PMF Performed at [facility], [address] [place], [place] [address]   Lactic acid, plasma     Status: Abnormal   Collection Time: 04/28/21  4:32 PM  Result Value  Ref Range   Lactic Acid,  Venous 2.2 (HH) 0.5 - 1.9 mmol/L    Comment: CRITICAL RESULT CALLED TO, READ BACK BY AND VERIFIED WITH SHANNON MARTIN 04/28/21 @ 9629 BY SB Performed at [facility], [address] [place], [place] [address]   CBG monitoring, ED     Status: Abnormal   Collection Time: 04/28/21  5:56 PM  Result Value Ref Range   Glucose-Capillary 235 (H) 70 - 99 mg/dL    Comment: Glucose reference range applies only to samples taken after fasting for at least 8 hours.  CBG monitoring, ED     Status: Abnormal   Collection Time: 04/28/21  9:00 PM  Result Value Ref Range   Glucose-Capillary 196 (H) 70 - 99 mg/dL    Comment: Glucose reference range applies only to samples taken after fasting for at least 8 hours.   DG Chest 2 View  Result Date: 04/27/2021 CLINICAL DATA:  Shortness of breath and cough EXAM: CHEST - 2 VIEW COMPARISON:  Chest x-ray 07/29/2020, CT chest 08/14/2017 FINDINGS: The heart and mediastinal contours are unchanged. Aortic calcification. No focal consolidation. No pulmonary edema. No pleural effusion. No pneumothorax. No acute osseous abnormality. Total reversed right shoulder arthroplasty. IMPRESSION: No active cardiopulmonary disease. Electronically Signed   By: Iven Finn M.D.   On: 04/27/2021 17:51   DG Abd 1 View  Result Date: 04/28/2021 CLINICAL DATA:  Shortness of breath, cough and hypertension. EXAM: ABDOMEN - 1 VIEW COMPARISON:  CT AP 02/06/2017 FINDINGS: Cholecystectomy clips identified. The bowel gas pattern is normal. No radio-opaque calculi or other significant radiographic abnormality are seen. IMPRESSION: Negative.  Nonobstructive bowel gas pattern. Electronically Signed   By: Kerby Moors M.D.   On: 04/28/2021 05:29   CT Head Wo Contrast  Result Date: 04/28/2021 CLINICAL DATA:  Cerebral hemorrhage suspected, headache, hypertension EXAM: CT HEAD WITHOUT CONTRAST TECHNIQUE: Contiguous axial images were obtained from the base of the  skull through the vertex without intravenous contrast. COMPARISON:  04/27/2021 FINDINGS: Brain: No evidence of acute infarction, hemorrhage, cerebral edema, mass, mass effect, or midline shift. Ventricles and sulci are within normal limits for age. No extra-axial fluid collection. Periventricular white matter changes, likely the sequela of chronic small vessel ischemic disease. Vascular: No hyperdense vessel. Skull: Normal. Negative for fracture or focal lesion. Sinuses/Orbits: No acute finding. Other: Status post left canal wall mastoidectomy. IMPRESSION: No acute intracranial process. Electronically Signed   By: Merilyn Baba M.D.   On: 04/28/2021 00:46   CT Head Wo Contrast  Result Date: 04/27/2021 CLINICAL DATA:  Headache, elevated blood pressure, cough, and shortness of breath. EXAM: CT HEAD WITHOUT CONTRAST TECHNIQUE: Contiguous axial images were obtained from the base of the skull through the vertex without intravenous contrast. COMPARISON:  CT head dated 07/29/2020. FINDINGS: Brain: No evidence of acute infarction, hemorrhage, hydrocephalus, extra-axial collection or mass lesion/mass effect. Periventricular white matter hypoattenuation likely represents chronic small vessel ischemic disease. Vascular: There are vascular calcifications in the carotid siphons. Skull: Negative for fracture or focal lesion. Sinuses/Orbits: The patient is status post a left canal wall up mastoidectomy. The paranasal sinuses are clear. Other: None. IMPRESSION: No acute intracranial process. Electronically Signed   By: Zerita Boers M.D.   On: 04/27/2021 18:13   MR BRAIN WO CONTRAST  Result Date: 04/28/2021 CLINICAL DATA:  70 year old female with headache, hypertensive, seizure. EXAM: MRI HEAD WITHOUT CONTRAST TECHNIQUE: Multiplanar, multiecho pulse sequences of the brain and surrounding structures were obtained without intravenous contrast. COMPARISON:  Head CT 0024 hours today.  Brain MRI 07/31/2020. FINDINGS: Brain:  Stable cerebral volume since January. No restricted diffusion to suggest acute infarction. No midline shift, mass effect, evidence of mass lesion, ventriculomegaly, extra-axial collection or acute intracranial hemorrhage. Cervicomedullary junction and pituitary are within normal limits. Stable gray and white matter signal throughout the brain. Patchy and confluent bilateral white matter T2 and FLAIR hyperintensity. Mild to moderate patchy hyperintensity in the pons. No cortical encephalomalacia, chronic cerebral blood products, hippocampal or mesial temporal lobe asymmetry. Vascular: Major intracranial vascular flow voids are stable since January. Skull and upper cervical spine: Negative. Sinuses/Orbits: Postoperative changes to both globes, otherwise negative orbits. Mild left ethmoid sinus mucosal thickening has regressed since January. Other: Mastoids remain well aerated. Grossly normal visible internal auditory structures. Stable scalp soft tissues, left posterior convexity chronic scarring. IMPRESSION: No acute intracranial abnormality. Stable noncontrast MRI appearance of the brain since January with mild to moderate for age chronic white matter changes. Electronically Signed   By: Genevie Ann M.D.   On: 04/28/2021 06:26   US Carotid Bilateral (at [facility] and AP only)  Result Date: 04/28/2021 CLINICAL DATA:  Evaluate carotid arteries EXAM: BILATERAL CAROTID DUPLEX ULTRASOUND TECHNIQUE: Pearline Cables scale imaging, color Doppler and duplex ultrasound were performed of bilateral carotid and vertebral arteries in the neck. COMPARISON:  06/18/2020 FINDINGS: Criteria: Quantification of carotid stenosis is based on velocity parameters that correlate the residual internal carotid diameter with NASCET-based stenosis levels, using the diameter of the distal internal carotid lumen as the denominator for stenosis measurement. The following velocity measurements were obtained: RIGHT ICA: 163.7 cm/sec CCA: 39.5 cm/sec SYSTOLIC  ICA/CCA RATIO:  2.3 ECA: 192.5 cm/sec LEFT ICA: 212.3 cm/sec CCA: 32.0 cm/sec SYSTOLIC ICA/CCA RATIO:  2.5 ECA: 326.2 cm/sec RIGHT CAROTID ARTERY: Plaque at the carotid bulb. RIGHT VERTEBRAL ARTERY:  Antegrade LEFT CAROTID ARTERY: Plaque in the distal CCA, in the bulb, and in the proximal ICA and ECA LEFT VERTEBRAL ARTERY:  Antegrade IMPRESSION: 1. 50-69% stenosis in the bilateral ICAs. 2. Antegrade flow in vertebral arteries. Electronically Signed   By: Merilyn Baba M.D.   On: 04/28/2021 03:38    Pending Labs Unresulted Labs (From admission, onward)     Start     Ordered   04/29/21 0500  Renal function panel  Tomorrow morning,   STAT        04/28/21 1331   04/29/21 0500  CBC  Tomorrow morning,   STAT        04/28/21 1331   04/28/21 2334  Basic metabolic panel  5A & 5P,   STAT      04/28/21 1220   04/28/21 1700  Magnesium  5A & 5P,   STAT      04/28/21 1220   04/28/21 1700  Phosphorus  5A & 5P,   STAT      04/28/21 1220   04/28/21 0500  Hemoglobin A1c  (Labs)  Tomorrow morning,   STAT        04/28/21 0156   04/27/21 1721  Urinalysis, Complete w Microscopic Urine, Clean Catch  Once,   STAT        04/27/21 1720            Vitals/Pain Today's Vitals   04/28/21 1914 04/28/21 1930 04/28/21 2000 04/28/21 2030  BP: (!) 108/50 (!) 124/54 121/61 (!) 121/53  Pulse: (!) 56 (!) 55 (!) 53 (!) 52  Resp: (!) 22 (!) 22 (!) 22 (!) 22  Temp:      TempSrc:  SpO2: 91% 90% 94% 97%  Weight:      Height:      PainSc:        Isolation Precautions No active isolations  Medications Medications  losartan (COZAAR) tablet 25 mg (25 mg Oral Given 04/28/21 0921)  metoprolol succinate (TOPROL-XL) 24 hr tablet 25 mg (25 mg Oral Given 04/28/21 0805)  rosuvastatin (CRESTOR) tablet 10 mg (10 mg Oral Given 04/28/21 0924)  citalopram (CELEXA) tablet 20 mg (20 mg Oral Given 04/28/21 0924)  nicotine (NICODERM CQ - dosed in mg/24 hours) patch 14 mg (14 mg Transdermal Patch Applied 04/28/21 0926)   pantoprazole (PROTONIX) EC tablet 40 mg (40 mg Oral Given 04/28/21 0925)  clopidogrel (PLAVIX) tablet 75 mg (75 mg Oral Given 04/28/21 0926)  montelukast (SINGULAIR) tablet 10 mg (10 mg Oral Given 04/28/21 2120)  cefTRIAXone (ROCEPHIN) 1 g in sodium chloride 0.9 % 100 mL IVPB (0 g Intravenous Stopped 04/28/21 0140)  methylPREDNISolone sodium succinate (SOLU-MEDROL) 40 mg/mL injection 40 mg (40 mg Intravenous Given 04/28/21 2031)    Followed by  predniSONE (DELTASONE) tablet 40 mg (has no administration in time range)  enoxaparin (LOVENOX) injection 40 mg (40 mg Subcutaneous Given 04/28/21 0807)  acetaminophen (TYLENOL) tablet 650 mg (has no administration in time range)    Or  acetaminophen (TYLENOL) suppository 650 mg (has no administration in time range)  traZODone (DESYREL) tablet 25 mg (has no administration in time range)  ondansetron (ZOFRAN) tablet 4 mg (has no administration in time range)    Or  ondansetron (ZOFRAN) injection 4 mg (has no administration in time range)  ipratropium-albuterol (DUONEB) 0.5-2.5 (3) MG/3ML nebulizer solution 3 mL (3 mLs Nebulization Given 04/28/21 2031)   stroke: mapping our early stages of recovery book ( Does not apply Not Given 04/28/21 0403)  LORazepam (ATIVAN) injection 1 mg (1 mg Intravenous Given 04/28/21 0236)  lacosamide (VIMPAT) 100 mg in sodium chloride 0.9 % 25 mL IVPB (0 mg Intravenous Stopped 04/28/21 1118)  levETIRAcetam (KEPPRA) tablet 500 mg (500 mg Oral Given 04/28/21 2120)  insulin aspart (novoLOG) injection 0-15 Units (5 Units Subcutaneous Given 04/28/21 1808)  insulin aspart (novoLOG) injection 0-5 Units (0 Units Subcutaneous Not Given 04/28/21 2123)  insulin aspart (novoLOG) injection 3 Units (3 Units Subcutaneous Given 04/28/21 1810)  ipratropium-albuterol (DUONEB) 0.5-2.5 (3) MG/3ML nebulizer solution 3 mL (3 mLs Nebulization Given 04/27/21 2332)  ipratropium-albuterol (DUONEB) 0.5-2.5 (3) MG/3ML nebulizer solution 3 mL (3 mLs  Nebulization Given 04/27/21 2332)  ipratropium-albuterol (DUONEB) 0.5-2.5 (3) MG/3ML nebulizer solution 3 mL (3 mLs Nebulization Given 04/27/21 2325)  methylPREDNISolone sodium succinate (SOLU-MEDROL) 125 mg/2 mL injection 125 mg (125 mg Intravenous Given 04/27/21 2324)  potassium chloride 10 mEq in 100 mL IVPB (0 mEq Intravenous Stopped 04/28/21 0140)  potassium chloride SA (KLOR-CON) CR tablet 40 mEq (40 mEq Oral Given 04/27/21 2332)  magnesium sulfate IVPB 2 g 50 mL (0 g Intravenous Stopped 04/28/21 0035)  midazolam (VERSED) injection 2 mg (2 mg Intravenous Given 04/28/21 0010)  magnesium sulfate IVPB 2 g 50 mL (0 g Intravenous Stopped 04/28/21 0259)  magnesium sulfate IVPB 2 g 50 mL (0 g Intravenous Stopped 04/28/21 1429)  sodium phosphate 30 mmol in dextrose 5 % 250 mL infusion (0 mmol Intravenous Stopped 04/28/21 2040)  levETIRAcetam (KEPPRA) IVPB 1000 mg/100 mL premix (0 mg Intravenous Stopped 04/28/21 1343)  LORazepam (ATIVAN) injection 1 mg (1 mg Intravenous Given 04/28/21 1259)    Mobility walks Moderate fall risk   Focused Assessments  R Recommendations: See Admitting Provider Note  Report given to:   Additional Notes: from home / A&O x4 /  deaf, reads lips, can talk mostly clear & understandable / purewick / 3lpm [place] / meds with water /

## 2021-04-28 NOTE — ED Notes (Signed)
Pt sleeping, VSS, no seizure like activity noted at this time.

## 2021-04-28 NOTE — Progress Notes (Signed)
Notified by patient's RN about seizure-like activity with head bobbing, rapid eye blinking,  full body tremors and twitching of extremities.  When I arrived, EDP at bedside.  Patient has what looks like a head tremor with her head off the bed.  Also intermittent tremor-like activity in all extremities.  Looks postictal but seems to be in and out.  No stiffness, drooling or clenching.  No focal neurodeficit.  Resisting eye exam.  Hemodynamically stable.  Received Ativan and Keppra.  Neurology notified.  Concern about Pseudoseizure.

## 2021-04-29 LAB — CBC
HCT: 38 % (ref 36.0–46.0)
Hemoglobin: 13.1 g/dL (ref 12.0–15.0)
MCH: 28.9 pg (ref 26.0–34.0)
MCHC: 34.5 g/dL (ref 30.0–36.0)
MCV: 83.9 fL (ref 80.0–100.0)
Platelets: 173 10*3/uL (ref 150–400)
RBC: 4.53 MIL/uL (ref 3.87–5.11)
RDW: 14.2 % (ref 11.5–15.5)
WBC: 9.1 10*3/uL (ref 4.0–10.5)
nRBC: 0 % (ref 0.0–0.2)

## 2021-04-29 LAB — RENAL FUNCTION PANEL
Albumin: 3 g/dL — ABNORMAL LOW (ref 3.5–5.0)
Anion gap: 9 (ref 5–15)
BUN: 13 mg/dL (ref 8–23)
CO2: 30 mmol/L (ref 22–32)
Calcium: 8.3 mg/dL — ABNORMAL LOW (ref 8.9–10.3)
Chloride: 103 mmol/L (ref 98–111)
Creatinine, Ser: 0.5 mg/dL (ref 0.44–1.00)
GFR, Estimated: >60 mL/min (ref >60)
Glucose, Bld: 232 mg/dL — ABNORMAL HIGH (ref 70–99)
Phosphorus: 3.3 mg/dL (ref 2.5–4.6)
Potassium: 2.8 mmol/L — ABNORMAL LOW (ref 3.5–5.1)
Sodium: 142 mmol/L (ref 135–145)

## 2021-04-29 LAB — GLUCOSE, CAPILLARY
Glucose-Capillary: 109 mg/dL — ABNORMAL HIGH (ref 70–99)
Glucose-Capillary: 243 mg/dL — ABNORMAL HIGH (ref 70–99)
Glucose-Capillary: 113 mg/dL — ABNORMAL HIGH (ref 70–99)

## 2021-04-29 LAB — HEMOGLOBIN A1C
Hgb A1c MFr Bld: 7.2 % — ABNORMAL HIGH (ref 4.8–5.6)
Mean Plasma Glucose: 160 mg/dL

## 2021-04-29 LAB — MAGNESIUM: Magnesium: 2.1 mg/dL (ref 1.7–2.4)

## 2021-04-29 MED ORDER — LACOSAMIDE 50 MG PO TABS
100.0000 mg | ORAL_TABLET | Freq: Two times a day (BID) | ORAL | Status: DC
  Administered 2021-04-29 – 2021-05-01 (×4): 100 mg via ORAL
  Filled 2021-04-29 (×4): qty 2

## 2021-04-29 MED ORDER — POTASSIUM CHLORIDE CRYS ER 20 MEQ PO TBCR
60.0000 meq | EXTENDED_RELEASE_TABLET | Freq: Two times a day (BID) | ORAL | Status: AC
  Administered 2021-04-29 (×2): 60 meq via ORAL
  Filled 2021-04-29 (×2): qty 6

## 2021-04-29 NOTE — Progress Notes (Signed)
Pt had seizure like activity twice today, lasting about 2-5 mins, received prn ativan, vitals stable. MD and neuro  awared, will continue to monitor pt.

## 2021-04-29 NOTE — Progress Notes (Addendum)
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  Initially admitted for COPD exacerbation with uncontrolled hypertension.  However, she had a breakthrough seizure and strokelike symptoms.  Neurology consulted.  CT head and MRI brain without acute finding.  Carotid ultrasound with 50 to 69% bilateral ICA stenosis.   Subjective: This morning says feels unsteady on legs when ambulates. Questionable seizure-like activity early this morning per nursing, resolved. One loose stool this morning.  Objective: Vitals:   04/29/21 0000 04/29/21 0041 04/29/21 0257 04/29/21 0900  BP: (!) 131/55 134/90 (!) 126/51 (!) 126/44  Pulse: (!) 57 (!) 59 (!) 56   Resp: (!) _0 Temp:  97.6 F (36.4 C) (!) 97.5 F (36.4 C) 97.6 F (36.4 C)  TempSrc:    Oral  SpO2: 94% 95% 95% 94%  Weight:      Height:        Intake/Output Summary (Last 24 hours) at 04/29/2021 1210 Last data filed at 04/29/2021 1013 Gross per 24 hour  Intake 120 ml  Output --  Net 120 ml   Filed Weights   04/27/21 1716  Weight: 61.2 kg    Examination:  GENERAL: No apparent distress.  Nontoxic. HEENT: MMM.  Vision grossly intact. NECK: Supple.  No apparent JVD.  RESP:  No IWOB.  Fair aeration bilaterally. CVS:  RRR. Heart sounds normal.  ABD/GI/GU: BS+. Abd soft, NTND.  MSK/EXT:  Moves extremities. No apparent deformity. No edema.  Decreased ROM in right  shoulder SKIN: no apparent skin lesion or wound NEURO: Awake, alert and oriented appropriately.  No apparent focal neuro deficit. PSYCH: Calm. Normal affect.   Procedures:  None  Microbiology summarized: NIOEV-03 and influenza PCR nonreactive.  Assessment & Plan:  # Pseudoseizure # History seizure disorder Neuro following, thinks seizure-like activity here most consistent w/ pseudoseizure.  - resume keppra and lacosamide, consider weaning as outpt w/ neurology f/u - seizure precautions  # COPD exacerbation/chronic hypoxic RF-presented with increased shortness of breath, cough and wheeze.  Breathing has improved.  She is on 2 L by [place] at night. Cough not productive - hold abx in setting of diarrhea/loose stool - continue steroids -Wean oxygen as able  # Debility Patient interested in snf if qualifies - pt consult  Hypokalemia due to chronic diarrhea?  Lasix can contribute.  K 2.8 this morning, mg wnl - oral potassium 60 bid - monitor - loose stools as below  Uncontrolled hypertension: BP 216/84 on arrival.  Normotensive this morning  - monitor  NIDDM-2 with hyperglycemia: Hyperglycemia likely from steroid.  On glimepiride at home. Recent Labs  Lab 04/28/21 0315 04/28/21 0735 04/28/21 1206 04/28/21 1756 04/28/21 2100  GLUCAP 254* 275* 251* 235* 196*  -SSI moderate -Add nightly coverage -Add mealtime coverage at 3 units -Follow hemoglobin A1c  Anxiety and depression -Continue home Celexa   Chronic diarrhea-evaluated for this by GI.  Colo showed polyps and nonbleeding internal hemorrhoid in 10/2020.  EGD  the same time concerning for Barrett's esophagus, 2 cm hiatal hernia and gastric polyp. She tried Imodium and fiber without success per patient's son  This morning one loose stool, not watery. Do not think infectious -Consider Lomotil if worsens; appears to be improving  Frequent urination-chronic issue.  She denies dysuria or acute change. -Continue  ceftriaxone -Follow urine culture  Esophageal dysphagia?  EGD in 10/2020 above.   -Continue PPI -May consider esophagram once seizure disorder under good control.  Tobacco use disorder-reports smoking about 4 to 5 cigarettes a day. -Encourage tobacco cessation -Nicotine patch while in-house  Impaired hearing/deaf Today I used ASL interpreter, but of note patient told SLP today she would prefer to communicate using lips   Body mass index is 23.91 kg/m.         DVT prophylaxis:  enoxaparin (LOVENOX) injection 40 mg Start: 04/28/21 0800  Code Status: Full code Family Communication: none @ bedside Level of care: Progressive Cardiac Status is: Inpatient  Remains inpatient appropriate because: Breakthrough seizure, electrolyte derangement   Consultants:  Neurology   Sch Meds:  Scheduled Meds:   stroke: mapping our early stages of recovery book   Does not apply Once   citalopram  20 mg Oral Daily   clopidogrel  75 mg Oral Daily   enoxaparin (LOVENOX) injection  40 mg Subcutaneous Q24H   insulin aspart  0-15 Units Subcutaneous TID WC   insulin aspart  0-5 Units Subcutaneous QHS   insulin aspart  3 Units Subcutaneous TID WC   ipratropium-albuterol  3 mL Nebulization QID   levETIRAcetam  500 mg Oral BID   losartan  25 mg Oral Daily   metoprolol succinate  25 mg Oral Q breakfast   montelukast  10 mg Oral QHS   nicotine  14 mg Transdermal Daily   pantoprazole  40 mg Oral Daily   potassium chloride  60 mEq Oral BID   predniSONE  40 mg Oral Q breakfast   rosuvastatin  10 mg Oral Daily   Continuous Infusions:  lacosamide (VIMPAT) IV 100 mg (04/29/21 1009)   PRN Meds:.acetaminophen **OR** acetaminophen, LORazepam, ondansetron **OR** ondansetron (ZOFRAN) IV, traZODone  Antimicrobials: Anti-infectives (From admission, onward)    Start     Dose/Rate Route Frequency Ordered Stop   04/27/21 2330  cefTRIAXone (ROCEPHIN) 1 g in sodium chloride 0.9 % 100 mL IVPB  Status:   Discontinued        1 g 200 mL/hr over 30 Minutes Intravenous Every 24 hours 04/27/21 2324 04/29/21 1206        I have personally reviewed the following labs and images: CBC: Recent Labs  Lab 04/27/21 1729 04/28/21 1001 04/29/21 0550  WBC 7.6 3.7* 9.1  NEUTROABS 4.3  --   --   HGB 15.0 12.1 13.1  HCT 44.3 35.4* 38.0  MCV 82.6 82.9 83.9  PLT 258 186 173   BMP &GFR Recent Labs  Lab 04/27/21 1729 04/28/21 1001 04/29/21 0550  NA 142 143 142  K 2.4* 5.4* 2.8*  CL 99 109 103  CO2 _0 GLUCOSE 125* 246* 232*  BUN 7* 9 13  CREATININE 0.56 0.49  0.46 0.50  CALCIUM 8.9 6.9* 8.3*  MG 1.0* 1.5* 2.1  PHOS  --  2.1* 3.3   Estimated Creatinine Clearance: 54.9 mL/min (by C-G formula based on SCr of 0.5 mg/dL). Liver & Pancreas: Recent Labs  Lab 04/27/21 1729 04/29/21 0550  AST 24  --   ALT 13  --  ALKPHOS 85  --   BILITOT 0.6  --   PROT 7.5  --   ALBUMIN 3.9 3.0*   No results for input(s): LIPASE, AMYLASE in the last 168 hours. No results for input(s): AMMONIA in the last 168 hours. Diabetic: No results for input(s): HGBA1C in the last 72 hours. Recent Labs  Lab 04/28/21 0315 04/28/21 0735 04/28/21 1206 04/28/21 1756 04/28/21 2100  GLUCAP 254* 275* 251* 235* 196*   Cardiac Enzymes: No results for input(s): CKTOTAL, CKMB, CKMBINDEX, TROPONINI in the last 168 hours. No results for input(s): PROBNP in the last 8760 hours. Coagulation Profile: No results for input(s): INR, PROTIME in the last 168 hours. Thyroid Function Tests: No results for input(s): TSH, T4TOTAL, FREET4, T3FREE, THYROIDAB in the last 72 hours. Lipid Profile: Recent Labs    04/28/21 1001  CHOL 120  HDL 42  LDLCALC 66  TRIG 62  CHOLHDL 2.9   Anemia Panel: No results for input(s): VITAMINB12, FOLATE, FERRITIN, TIBC, IRON, RETICCTPCT in the last 72 hours. Urine analysis:    Component Value Date/Time   COLORURINE YELLOW (A) 07/29/2020 2146   APPEARANCEUR HAZY (A) 07/29/2020 2146    APPEARANCEUR Clear 11/14/2011 1607   LABSPEC 1.018 07/29/2020 2146   LABSPEC 1.006 11/14/2011 1607   PHURINE 5.0 07/29/2020 2146   GLUCOSEU NEGATIVE 07/29/2020 2146   GLUCOSEU Negative 11/14/2011 1607   HGBUR NEGATIVE 07/29/2020 2146   BILIRUBINUR NEGATIVE 07/29/2020 2146   BILIRUBINUR Negative 11/14/2011 1607   KETONESUR NEGATIVE 07/29/2020 2146   PROTEINUR NEGATIVE 07/29/2020 2146   NITRITE NEGATIVE 07/29/2020 2146   LEUKOCYTESUR NEGATIVE 07/29/2020 2146   LEUKOCYTESUR Negative 11/14/2011 1607   Sepsis Labs: Invalid input(s): PROCALCITONIN, Colon  Microbiology: Recent Results (from the past 240 hour(s))  Resp Panel by RT-PCR (Flu A&B, Covid) Nasopharyngeal Swab     Status: None   Collection Time: 04/27/21 11:33 PM   Specimen: Nasopharyngeal Swab; Nasopharyngeal(NP) swabs in vial transport medium  Result Value Ref Range Status   SARS Coronavirus 2 by RT PCR NEGATIVE NEGATIVE Final    Comment: (NOTE) SARS-CoV-2 target nucleic acids are NOT DETECTED.  The SARS-CoV-2 RNA is generally detectable in upper respiratory specimens during the acute phase of infection. The lowest concentration of SARS-CoV-2 viral copies this assay can detect is 138 copies/mL. A negative result does not preclude SARS-Cov-2 infection and should not be used as the sole basis for treatment or other patient management decisions. A negative result may occur with  improper specimen collection/handling, submission of specimen other than nasopharyngeal swab, presence of viral mutation(s) within the areas targeted by this assay, and inadequate number of viral copies(<138 copies/mL). A negative result must be combined with clinical observations, patient history, and epidemiological information. The expected result is Negative.  Fact Sheet for Patients:  EntrepreneurPulse.com.au  Fact Sheet for Healthcare Providers:  IncredibleEmployment.be  This test is no t yet  approved or cleared by the [place] FDA and  has been authorized for detection and/or diagnosis of SARS-CoV-2 by FDA under an Emergency Use Authorization (EUA). This EUA will remain  in effect (meaning this test can be used) for the duration of the COVID-19 declaration under Section 564(b)(1) of the Act, 21 U.S.C.section 360bbb-3(b)(1), unless the authorization is terminated  or revoked sooner.       Influenza A by PCR NEGATIVE NEGATIVE Final   Influenza B by PCR NEGATIVE NEGATIVE Final    Comment: (NOTE) The Xpert Xpress SARS-CoV-2/FLU/RSV plus assay is intended as an aid in the  diagnosis of influenza from Nasopharyngeal swab specimens and should not be used as a sole basis for treatment. Nasal washings and aspirates are unacceptable for Xpert Xpress SARS-CoV-2/FLU/RSV testing.  Fact Sheet for Patients: EntrepreneurPulse.com.au  Fact Sheet for Healthcare Providers: IncredibleEmployment.be  This test is not yet approved or cleared by the [place] FDA and has been authorized for detection and/or diagnosis of SARS-CoV-2 by FDA under an Emergency Use Authorization (EUA). This EUA will remain in effect (meaning this test can be used) for the duration of the COVID-19 declaration under Section 564(b)(1) of the Act, 21 U.S.C. section 360bbb-3(b)(1), unless the authorization is terminated or revoked.  Performed at [facility], [address], [place], [place] [address]     Radiology Studies: No results found.    Laurey Arrow, MD [facility]  If 7PM-7AM, please contact night-coverage [email or web address] 04/29/2021, 12:10 PM

## 2021-04-29 NOTE — Evaluation (Signed)
Physical Therapy Evaluation Patient Details Name: [name] MRN: [id number] DOB: 1950/11/06 Today's Date: 04/29/2021  History of Present Illness  Pt is a 70 y/o F admitted on 04/27/21 with c/c of SOB, HTN, & HA. Pt was initially admitted for COPD exacerbation with uncontrolled HTN but pt had a breakthrough seizure & stroke-like symptoms. Neurology was consulted by CT & MRI of brain was without acute findings. PMH: COPD/RF on 2L at night, combined CHF, liver cirrhosis, DM2, HTN, RA, depression, seizure disorder, CVA, chronic diarrhea & impaired hearing  Clinical Impression  Pt seen for PT evaluation with co-tx with OT. Pt is deaf but does not use ASL, instead she prefers to read lips so OT primarily communicated with pt in this way during session (with face shield donned). Pt is able to complete bed mobility with supervision but requires min assist for transfers & gait with RW. This is a change in pt's functional status as she reports being independent without AD prior to admission. Pt does appear more lethargic as gait distance progresses, with pt frequently closing eyes & requiring more assistance 2/2 decreased balance. OT also observed pt's decreased ability to track finger with eyes & nurse called to assess pt. Pt improving once sitting EOB & consuming fruit cup. Pt is currently unsafe to d/c home where she would be alone all day & would benefit from STR upon d/c to maximize independence with functional mobility & reduce fall risk prior to return home.       Recommendations for follow up therapy are one component of a multi-disciplinary discharge planning process, led by the attending physician.  Recommendations may be updated based on patient status, additional functional criteria and insurance authorization.  Follow Up Recommendations Skilled nursing-short term rehab (<3 hours/day)    Assistance Recommended at Discharge Frequent or constant Supervision/Assistance  Functional Status  Assessment Patient has had a recent decline in their functional status and demonstrates the ability to make significant improvements in function in a reasonable and predictable amount of time.  Equipment Recommendations  Rolling walker (2 wheels)    Recommendations for Other Services       Precautions / Restrictions Precautions Precautions: Fall Precaution Comments: seizure, deaf (prefers reads lips) Restrictions Weight Bearing Restrictions: No      Mobility  Bed Mobility Overal bed mobility: Needs Assistance Bed Mobility: Supine to Sit     Supine to sit: Supervision          Transfers Overall transfer level: Needs assistance   Transfers: Sit to/from Stand;Stand Pivot Transfers Sit to Stand: Min assist (sit<>stand without AD) Stand pivot transfers: Min assist (with RW)              Ambulation/Gait Ambulation/Gait assistance: Min assist;Min guard (CGA increasing to min assist as distance increases & pt appears more lethargic) Gait Distance (Feet): 35 Feet Assistive device: Rolling walker (2 wheels) Gait Pattern/deviations: Decreased step length - right;Decreased step length - left;Decreased stride length Gait velocity: decreased   General Gait Details: slight staggering R<>L, assistance with turning RW  Stairs            Wheelchair Mobility    Modified Rankin (Stroke Patients Only)       Balance Overall balance assessment: Needs assistance Sitting-balance support: Feet supported;Bilateral upper extremity supported Sitting balance-Leahy Scale: Fair Sitting balance - Comments: supervision static sitting   Standing balance support: No upper extremity supported Standing balance-Leahy Scale: Poor Standing balance comment: Pt attempts standing EOB without UE support but leaning  posteriorly on bed with BLE & pt with posterior lean requiring min assist to correct. Pt requires min assist when ambulating with BUE support on RW.                              Pertinent Vitals/Pain Pain Assessment: Faces Faces Pain Scale: Hurts a little bit Pain Location: pt guarding RUE throughout session, reports pain following surgery 2 years ago Pain Intervention(s): Monitored during session    Home Living Family/patient expects to be discharged to:: Private residence Living Arrangements: Children   Type of Home: Apartment Home Access: Level entry       Home Layout: One level   Additional Comments: Pt lives with son & daughter in law who both work during the day (son: 7A-7P, daughter in law 2P-3A)    Prior Function Prior Level of Function : Independent/Modified Independent             Mobility Comments: Pt reports she was independent without AD, does not drive.       Hand Dominance        Extremity/Trunk Assessment   Upper Extremity Assessment Upper Extremity Assessment: Generalized weakness (Pt c/o pain in RUE following surgery 2 years ago, guards RUE throughout session but can use it to hold RW.)    Lower Extremity Assessment Lower Extremity Assessment: Generalized weakness       Communication   Communication: Deaf (does not use ASL, prefers to read lips)  Cognition Arousal/Alertness: Awake/alert Behavior During Therapy: WFL for tasks assessed/performed Overall Cognitive Status: Within Functional Limits for tasks assessed                                 General Comments: Pleasant lady, appears somewhat lethargic.        General Comments General comments (skin integrity, edema, etc.): Pt on 4L/min via nasal cannula & SPO2 >/=90%    Exercises     Assessment/Plan    PT Assessment Patient needs continued PT services  PT Problem List Decreased strength;Decreased safety awareness;Decreased mobility;Decreased activity tolerance;Decreased balance;Cardiopulmonary status limiting activity       PT Treatment Interventions DME instruction;Therapeutic activities;Modalities;Gait training;Therapeutic  exercise;Patient/family education;Stair training;Balance training;Functional mobility training;Neuromuscular re-education;Manual techniques    PT Goals (Current goals can be found in the Care Plan section)  Acute Rehab PT Goals Patient Stated Goal: get better PT Goal Formulation: With patient Time For Goal Achievement: 05/13/21 Potential to Achieve Goals: Good    Frequency Min 2X/week   Barriers to discharge Decreased caregiver support children work during the day, pt home alone all day    Co-evaluation PT/OT/SLP Co-Evaluation/Treatment: Yes Reason for Co-Treatment: For patient/therapist safety;Necessary to address cognition/behavior during functional activity PT goals addressed during session: Mobility/safety with mobility;Balance;Proper use of DME         AM-PAC PT "6 Clicks" Mobility  Outcome Measure Help needed turning from your back to your side while in a flat bed without using bedrails?: None Help needed moving from lying on your back to sitting on the side of a flat bed without using bedrails?: A Little Help needed moving to and from a bed to a chair (including a wheelchair)?: A Little Help needed standing up from a chair using your arms (e.g., wheelchair or bedside chair)?: A Little Help needed to walk in hospital room?: A Little Help needed climbing 3-5 steps with a railing? :  A Lot 6 Click Score: 18    End of Session   Activity Tolerance: Patient limited by lethargy Patient left: in bed (in care of OT) Nurse Communication: Mobility status (pt's increasing lethargy during session) PT Visit Diagnosis: Muscle weakness (generalized) (M62.81);Unsteadiness on feet (R26.81);Difficulty in walking, not elsewhere classified (R26.2)    Time: 1343-1410 PT Time Calculation (min) (ACUTE ONLY): 27 min   Charges:   PT Evaluation $PT Eval High Complexity: 1 High          Lavone Nian, PT, DPT 04/29/21, 3:48 PM   Waunita Schooner 04/29/2021, 3:44 PM

## 2021-04-29 NOTE — Evaluation (Signed)
Occupational Therapy Evaluation Patient Details Name: [name] MRN: [id number] DOB: 1950/12/30 Today's Date: 04/29/2021   History of Present Illness Pt is a 70 y/o F admitted on 04/27/21 with c/c of SOB, HTN, & HA. Pt was initially admitted for COPD exacerbation with uncontrolled HTN but pt had a breakthrough seizure & stroke-like symptoms. Neurology was consulted by CT & MRI of brain was without acute findings. PMH: COPD/RF on 2L at night, combined CHF, liver cirrhosis, DM2, HTN, RA, depression, seizure disorder, CVA, chronic diarrhea & impaired hearing   Clinical Impression   [name] was seen for OT/PT co-evaluation this date. Pt is deaf but does not use ASL, instead she prefers to read lips (with face shield donned). Prior to hospital admission, pt was Independent for mobility and ADLs. Pt lives with son and daughter in law who work full time. Pt presents to acute OT demonstrating impaired ADL performance and functional mobility 2/2 decreased activity tolerance and functional strength/ROM/balance tolerance. Pt currently requires MIN A + RW for simulated toilet t/f. SETUP + SBA self-feeding seated EOB. CGA + increased time don/doff B socks seated EOB. Pt would benefit from skilled OT to address noted impairments and functional limitations (see below for any additional details) in order to maximize safety and independence while minimizing falls risk and caregiver burden. Upon hospital discharge, recommend STR to maximize pt safety and return to PLOF.       Recommendations for follow up therapy are one component of a multi-disciplinary discharge planning process, led by the attending physician.  Recommendations may be updated based on patient status, additional functional criteria and insurance authorization.   Follow Up Recommendations  Skilled nursing-short term rehab (<3 hours/day)    Assistance Recommended at Discharge Intermittent Supervision/Assistance  Functional Status  Assessment  Patient has had a recent decline in their functional status and demonstrates the ability to make significant improvements in function in a reasonable and predictable amount of time.  Equipment Recommendations  Other (comment) (defer to next venue of care)    Recommendations for Other Services       Precautions / Restrictions Precautions Precautions: Fall Precaution Comments: seizure, deaf (prefers reads lips) Restrictions Weight Bearing Restrictions: No      Mobility Bed Mobility Overal bed mobility: Needs Assistance Bed Mobility: Supine to Sit;Sit to Supine     Supine to sit: Supervision Sit to supine: Supervision        Transfers Overall transfer level: Needs assistance Equipment used: Rolling walker (2 wheels) Transfers: Sit to/from [facility] Sit to Stand: Min assist Stand pivot transfers: Min assist         General transfer comment: posterior lean      Balance Overall balance assessment: Needs assistance Sitting-balance support: Feet supported;No upper extremity supported Sitting balance-Leahy Scale: Fair Sitting balance - Comments: supervision static sitting   Standing balance support: No upper extremity supported Standing balance-Leahy Scale: Poor Standing balance comment: Pt attempts standing EOB without UE support but leaning posteriorly on bed with BLE & pt with posterior lean requiring min assist to correct. Pt requires min assist when ambulating with BUE support on RW.                           ADL either performed or assessed with clinical judgement   ADL Overall ADL's : Needs assistance/impaired  General ADL Comments: MIN A + RW for simulated toilet t/f. SETUP + SBA self-feeding seated EOB. CGA + increased time don/doff B socks seated EOB.     Vision   Vision Assessment?: Vision impaired- to be further tested in functional context Additional  Comments: difficulty tracking vertically following mobility            Pertinent Vitals/Pain Pain Assessment: Faces Faces Pain Scale: Hurts a little bit Pain Location: pt guarding RUE throughout session, reports pain following surgery 2 years ago Pain Descriptors / Indicators: Discomfort;Dull Pain Intervention(s): Limited activity within patient's tolerance;Repositioned     Hand Dominance     Extremity/Trunk Assessment Upper Extremity Assessment Upper Extremity Assessment: Generalized weakness   Lower Extremity Assessment Lower Extremity Assessment: Generalized weakness       Communication Communication Communication: Deaf (does not use ASL, prefers to read lips)   Cognition Arousal/Alertness: Awake/alert Behavior During Therapy: WFL for tasks assessed/performed Overall Cognitive Status: Within Functional Limits for tasks assessed                                 General Comments: Pleasant lady, appears somewhat lethargic.     General Comments  Pt on 4L/min via nasal cannula & SPO2 >/=90%    Exercises Exercises: Other exercises Other Exercises Other Exercises: Pt educated re: OT role, DME recs, d/c recs, falls prevention, ECS Other Exercises: LBD, self-feeding, sit<>stand, sup<>sit, sitting/standing balance/tolerance   Shoulder Instructions      Home Living Family/patient expects to be discharged to:: Private residence Living Arrangements: Children Available Help at Discharge: Family;Available PRN/intermittently Type of Home: Apartment Home Access: Level entry     Home Layout: One level                   Additional Comments: Pt lives with son & daughter in law who both work during the day (son: 16A-7P, daughter in law 2P-3A)      Prior Functioning/Environment Prior Level of Function : Independent/Modified Independent             Mobility Comments: Pt reports she was independent without AD, does not drive.          OT Problem  List: Decreased strength;Decreased range of motion;Decreased activity tolerance;Impaired balance (sitting and/or standing);Decreased cognition;Decreased safety awareness      OT Treatment/Interventions: Self-care/ADL training;Therapeutic exercise;Energy conservation;DME and/or AE instruction;Therapeutic activities;Patient/family education;Balance training    OT Goals(Current goals can be found in the care plan section) Acute Rehab OT Goals Patient Stated Goal: to go home OT Goal Formulation: With patient Time For Goal Achievement: 05/13/21 Potential to Achieve Goals: Good  OT Frequency: Min 1X/week   Barriers to D/C: Decreased caregiver support          Co-evaluation PT/OT/SLP Co-Evaluation/Treatment: Yes Reason for Co-Treatment: Necessary to address cognition/behavior during functional activity;For patient/therapist safety;To address functional/ADL transfers PT goals addressed during session: Mobility/safety with mobility;Proper use of DME;Balance OT goals addressed during session: ADL's and self-care      AM-PAC OT "6 Clicks" Daily Activity     Outcome Measure Help from another person eating meals?: A Little Help from another person taking care of personal grooming?: A Little Help from another person toileting, which includes using toliet, bedpan, or urinal?: A Little Help from another person bathing (including washing, rinsing, drying)?: A Little Help from another person to put on and taking off regular upper body clothing?: A Little Help from  another person to put on and taking off regular lower body clothing?: A Little 6 Click Score: 18   End of Session Equipment Utilized During Treatment: Gait belt;Rolling walker (2 wheels) Nurse Communication: Mobility status  Activity Tolerance: Patient tolerated treatment well Patient left: in bed;with call bell/phone within reach;with bed alarm set  OT Visit Diagnosis: Other abnormalities of gait and mobility (R26.89)                 Time: 2637-8588 OT Time Calculation (min): 28 min Charges:  OT General Charges $OT Visit: 1 Visit OT Evaluation $OT Eval Moderate Complexity: 1 Mod OT Treatments $Self Care/Home Management : 8-22 mins  Dessie Coma, M.S. OTR/L  04/29/21, 4:11 PM  ascom [phone]

## 2021-04-29 NOTE — Progress Notes (Signed)
SLP Cancellation Note  Patient Details Name: [name] MRN: [id number] DOB: [birth year]   Cancelled treatment:       Reason Eval/Treat Not Completed: SLP screened, no needs identified, will sign off (chart reviewed; consulted NSG and Met w/ Pt in room).  Pt denied any difficulty swallowing and is currently on a regular diet; except for Larger pills. Recommended swallowing pills Whole vs Crushed in PUREE for safer swallowing w/ NSG. Pt conversed in conversation w/out overt expressive/receptive deficits noted; pt denied any speech-language deficits. Pt is Deaf at [place] for communication -- she declined wanting to use ASL for communication. NSG made aware. No further skilled ST services indicated as pt appears at her baseline. Pills to be given in Puree for easier swallowing per pt request. Pt agreed. NSG to reconsult if any change in status while admitted.        Orinda Kenner, MS, CCC-SLP Speech Language Pathologist Rehab Services [phone] [facility] 04/29/2021, 2:10 PM

## 2021-04-30 DIAGNOSIS — J441 Chronic obstructive pulmonary disease with (acute) exacerbation: Secondary | ICD-10-CM | POA: Diagnosis not present

## 2021-04-30 DIAGNOSIS — G40919 Epilepsy, unspecified, intractable, without status epilepticus: Secondary | ICD-10-CM | POA: Diagnosis not present

## 2021-04-30 LAB — BASIC METABOLIC PANEL
Anion gap: 7 (ref 5–15)
BUN: 17 mg/dL (ref 8–23)
CO2: 30 mmol/L (ref 22–32)
Calcium: 8.6 mg/dL — ABNORMAL LOW (ref 8.9–10.3)
Chloride: 107 mmol/L (ref 98–111)
Creatinine, Ser: 0.64 mg/dL (ref 0.44–1.00)
GFR, Estimated: >60 mL/min (ref >60)
Glucose, Bld: 143 mg/dL — ABNORMAL HIGH (ref 70–99)
Potassium: 3.6 mmol/L (ref 3.5–5.1)
Sodium: 144 mmol/L (ref 135–145)

## 2021-04-30 LAB — GLUCOSE, CAPILLARY
Glucose-Capillary: 139 mg/dL — ABNORMAL HIGH (ref 70–99)
Glucose-Capillary: 146 mg/dL — ABNORMAL HIGH (ref 70–99)
Glucose-Capillary: 143 mg/dL — ABNORMAL HIGH (ref 70–99)
Glucose-Capillary: 212 mg/dL — ABNORMAL HIGH (ref 70–99)

## 2021-04-30 LAB — MAGNESIUM: Magnesium: 2 mg/dL (ref 1.7–2.4)

## 2021-04-30 LAB — PHOSPHORUS: Phosphorus: 2.1 mg/dL — ABNORMAL LOW (ref 2.5–4.6)

## 2021-04-30 MED ORDER — GUAIFENESIN ER 600 MG PO TB12
600.0000 mg | ORAL_TABLET | Freq: Two times a day (BID) | ORAL | Status: DC
  Administered 2021-04-30 – 2021-05-01 (×2): 600 mg via ORAL
  Filled 2021-04-30 (×3): qty 1

## 2021-04-30 MED ORDER — PREDNISONE 20 MG PO TABS: 40.0000 mg | ORAL_TABLET | Freq: Every day | ORAL | Status: DC

## 2021-04-30 MED ORDER — ALBUTEROL SULFATE (2.5 MG/3ML) 0.083% IN NEBU: 2.5000 mg | INHALATION_SOLUTION | RESPIRATORY_TRACT | Status: DC | PRN

## 2021-04-30 MED ORDER — IPRATROPIUM-ALBUTEROL 0.5-2.5 (3) MG/3ML IN SOLN
3.0000 mL | Freq: Three times a day (TID) | RESPIRATORY_TRACT | Status: DC
  Administered 2021-04-30 – 2021-05-01 (×4): 3 mL via RESPIRATORY_TRACT
  Filled 2021-04-30 (×4): qty 3

## 2021-04-30 MED ORDER — LORAZEPAM 2 MG/ML IJ SOLN: 1.0000 mg | INTRAMUSCULAR | 0 refills | Status: DC | PRN

## 2021-04-30 MED ORDER — LEVETIRACETAM 500 MG PO TABS: 500.0000 mg | ORAL_TABLET | Freq: Two times a day (BID) | ORAL | Status: DC

## 2021-04-30 MED ORDER — FLUTICASONE FUROATE-VILANTEROL 200-25 MCG/ACT IN AEPB
1.0000 | INHALATION_SPRAY | Freq: Every day | RESPIRATORY_TRACT | Status: DC
  Administered 2021-04-30 – 2021-05-01 (×2): 1 via RESPIRATORY_TRACT
  Filled 2021-04-30: qty 28

## 2021-04-30 MED ORDER — POTASSIUM PHOSPHATES 15 MMOLE/5ML IV SOLN
15.0000 mmol | Freq: Once | INTRAVENOUS | Status: AC
  Administered 2021-04-30: 15 mmol via INTRAVENOUS
  Filled 2021-04-30: qty 5

## 2021-04-30 MED ORDER — HYDRALAZINE HCL 20 MG/ML IJ SOLN
10.0000 mg | Freq: Four times a day (QID) | INTRAMUSCULAR | Status: DC | PRN
  Administered 2021-04-30 – 2021-05-01 (×2): 10 mg via INTRAVENOUS
  Filled 2021-04-30 (×2): qty 1

## 2021-04-30 MED ORDER — GUAIFENESIN-DM 100-10 MG/5ML PO SYRP: 5.0000 mL | ORAL_SOLUTION | ORAL | Status: DC | PRN

## 2021-04-30 NOTE — Progress Notes (Signed)
Neurology Progress Note  Pls see full consutl note by Dr. Curly Shores 04/28/21.   [name] is a 70 y.o. female with a past medical history significant for deafness requiring ASL interpreter, known seizure disorder as wella s suspected PNES, congestive heart failure, cirrhosis, COPD, depression, RLS, diabetes, hypertension, possible rheumatoid arthritis (not on immunosuppression on review of her medications with son) p/w breakthrough seizure int he setting of severe electrolyte derangements.  Interval events: Patient has continued to have at least 1-2 spells per day c/f seizure vs PNES for which she has received PRN ativan each time. Various spell types, focal motor, staring, blinking, or decreased responsiveness. Unclear if post-ictal afterwards bc she always receives ativan.   Physical Exam  Constitutional: Appears well-developed and well-nourished.  Psych: Affect appropriate to situation, calm and cooperative Eyes: No scleral injection HENT: No oropharyngeal obstruction.  MSK: no joint deformities.  Cardiovascular: Mildly bradycardic in the 50s, but perfusing extremities well Respiratory: Effort normal, non-labored breathing, on nasal cannula GI: Soft.  No distension. There is no tenderness.  Skin: Warm dry and intact visible skin   Neuro: Mental Status: Patient is awake, alert, oriented to person, place, month, year, but not situation (does not remember his seizure) or day of the week Patient is able to give a clear and coherent history corroborated by son No signs of aphasia or neglect Cranial Nerves: II: Visual Fields are full. Pupils are equal, round, and reactive to light.   III,IV, VI: EOMI without ptosis or diploplia.  V: Facial sensation is symmetric to temperature VII: Facial movement is symmetric.  VIII: hearing is intact to voice X: Uvula elevates symmetrically XI: Shoulder shrug is symmetric. XII: tongue is midline without atrophy or fasciculations.  Motor: Tone is  normal. Bulk is normal.  Right upper extremity is pain limited at the shoulder, but does not have any drift.   Sensory: Reports reduced sensation in the right arm and leg to light touch and temperature Deep Tendon Reflexes: Slightly increased reflexes on the right compared to the left but both 2+ Cerebellar: Finger-nose is intact bilaterally  MRI brain 04/28/21 showed no new findings.  A/P: 70 yo woman with hx epilepsy on vimpat and keppra as well as PNES with persistent spells 1-2x/day since admission despite correction of metabolic derangements.   - D/w Dr. Dwyane Dee, transfer initiated to [facility] for cEEG for spell characterization - Continue vimpat 165m bid and keppra 5074mbid - IV ativan for seizure lasting >5 min or spell with unstable vital signs - Will continue to follow until transfer  CoSu MonksMD [facility] [phone] 7pm- 7am, please page neurology on call as listed in [place]

## 2021-04-30 NOTE — Care Management Important Message (Signed)
Important Message  Patient Details  Name: KRISTIANNE ALBIN MRN: [id number] Date of Birth: 1950/11/02   Medicare Important Message Given:  Yes  Patient asleep upon visit.  Copy of Medicare IM left on beside tray for reference.    Dannette Barbara 04/30/2021, 5:04 PM

## 2021-04-30 NOTE — Progress Notes (Signed)
PROGRESS NOTE  ALECIA Sherman IHK:[id number] DOB: December 15, 1950   PCP: [facility]  Patient is from: Home  DOA: 04/27/2021 LOS: 3  Chief complaints:  Chief Complaint  Patient presents with   Shortness of Breath   Hypertension   Headache     Brief Narrative / Interim history: 70 year old F with PMH of COPD/RF on 2 L at night, combined CHF, liver cirrhosis, DM-2, HTN, RA, depression, seizure disorder, CVA, chronic diarrhea and impaired hearing presenting with increased shortness of breath, cough, wheeze, markedly elevated blood pressure, headache, dizziness and blurry vision.  Initially admitted for COPD exacerbation with uncontrolled hypertension.  However, she had a breakthrough seizure and strokelike symptoms.  Neurology consulted.  CT head and MRI brain without acute finding.  Carotid ultrasound with 50 to 69% bilateral ICA stenosis.   Subjective: No significant overnight events, patient was having intermittent episodes of blanking and staring but no tonic-clonic episodes as per RN.  Patient denies feeling of any episodes. Patient was very sleepy and was talking very slow, stated that she feels tired, unable to offer any complaints. Patient was AAO x3   Objective: Vitals:   04/30/21 0813 04/30/21 1128 04/30/21 1345 04/30/21 1400  BP: (!) 160/58 (!) 175/82  (!) 166/64  Pulse: 60 63 64 64  Resp: _0 Temp: 98.7 F (37.1 C) 98.4 F (36.9 C)    TempSrc:      SpO2: 91% 91% 94%   Weight:      Height:        Intake/Output Summary (Last 24 hours) at 04/30/2021 1619 Last data filed at 04/30/2021 1000 Gross per 24 hour  Intake 480 ml  Output --  Net 480 ml   Filed Weights   04/27/21 1716  Weight: 61.2 kg    Examination:  GENERAL: No apparent distress.  Nontoxic. HEENT: MMM.  Vision grossly intact. NECK: Supple.  No apparent JVD.  RESP:  No IWOB.  Fair aeration bilaterally. CVS:  RRR. Heart sounds normal.  ABD/GI/GU: BS+. Abd soft, NTND.   MSK/EXT:  Moves extremities. No apparent deformity. No edema.  Decreased ROM in right shoulder SKIN: no apparent skin lesion or wound NEURO: Awake, alert and oriented appropriately.  No apparent focal neuro deficit. PSYCH: Calm. Normal affect.   Procedures:  None  Microbiology summarized: VFIEP-32 and influenza PCR nonreactive.  Assessment & Plan:  # Pseudoseizure # History seizure disorder Neuro following, thinks seizure-like activity here most consistent w/ pseudoseizure.  - resume keppra and lacosamide, consider weaning as outpt w/ neurology f/u - seizure precautions  # COPD exacerbation/chronic hypoxic RF-presented with increased shortness of breath, cough and wheeze.  Breathing has improved.  She is on 2 L by [place] at night. Cough not productive - hold abx in setting of diarrhea/loose stool - continue steroids -Wean oxygen as able 10/25 started Breo Ellipta inhaler, continue DuoNeb 3 times daily and continue albuterol as needed  Added Mucinex 600 mg twice daily and Robitussin-DM as needed  # Debility Patient interested in snf if qualifies - pt consult  Hypokalemia due to chronic diarrhea?  Lasix can contribute.  K 2.8 this morning, mg wnl - oral potassium 60 bid - monitor - loose stools as below   Hypophosphatemia, phos repleted.  Uncontrolled hypertension: BP 216/84 on arrival.  BP improved and remained normotensive. 10/25 again noticed high blood pressure, started hydralazine IV as needed - monitor  NIDDM-2 with hyperglycemia: Hyperglycemia likely from steroid.  On glimepiride at home.  Recent Labs  Lab 04/29/21 1258 04/29/21 1725 04/29/21 2211 04/30/21 0811 04/30/21 1229  GLUCAP 109* 243* 113* 139* 146*  -SSI moderate -Add nightly coverage -Add mealtime coverage at 3 units -Follow hemoglobin A1c  Anxiety and depression -Continue home Celexa   Chronic diarrhea-evaluated for this by GI.  Colo showed polyps and nonbleeding internal hemorrhoid in 10/2020.   EGD the same time concerning for Barrett's esophagus, 2 cm hiatal hernia and gastric polyp. She tried Imodium and fiber without success per patient's son  This morning one loose stool, not watery. Do not think infectious -Consider Lomotil if worsens; appears to be improving  Frequent urination-chronic issue.  She denies dysuria or acute change. -Continue ceftriaxone -Follow urine culture  Esophageal dysphagia?  EGD in 10/2020 above.   -Continue PPI -May consider esophagram once seizure disorder under good control.  Tobacco use disorder-reports smoking about 4 to 5 cigarettes a day. -Encourage tobacco cessation -Nicotine patch while in-house  Impaired hearing/deaf S/p  ASL interpreter, but of note patient told SLP today she would prefer to communicate using lips   Body mass index is 23.91 kg/m.       DVT prophylaxis:  enoxaparin (LOVENOX) injection 40 mg Start: 04/28/21 0800  Code Status: Full code Family Communication: none @ bedside Level of care: Progressive Cardiac Status is: Inpatient  Remains inpatient appropriate because: Breakthrough seizure, electrolyte derangement   Consultants:  Neurology   Sch Meds:  Scheduled Meds:   stroke: mapping our early stages of recovery book   Does not apply Once   citalopram  20 mg Oral Daily   clopidogrel  75 mg Oral Daily   enoxaparin (LOVENOX) injection  40 mg Subcutaneous Q24H   insulin aspart  0-15 Units Subcutaneous TID WC   insulin aspart  0-5 Units Subcutaneous QHS   insulin aspart  3 Units Subcutaneous TID WC   ipratropium-albuterol  3 mL Nebulization TID   lacosamide  100 mg Oral BID   levETIRAcetam  500 mg Oral BID   losartan  25 mg Oral Daily   metoprolol succinate  25 mg Oral Q breakfast   montelukast  10 mg Oral QHS   nicotine  14 mg Transdermal Daily   pantoprazole  40 mg Oral Daily   predniSONE  40 mg Oral Q breakfast   rosuvastatin  10 mg Oral Daily   Continuous Infusions:   PRN Meds:.acetaminophen  **OR** acetaminophen, albuterol, hydrALAZINE, LORazepam, ondansetron **OR** ondansetron (ZOFRAN) IV, traZODone  Antimicrobials: Anti-infectives (From admission, onward)    Start     Dose/Rate Route Frequency Ordered Stop   04/27/21 2330  cefTRIAXone (ROCEPHIN) 1 g in sodium chloride 0.9 % 100 mL IVPB  Status:  Discontinued        1 g 200 mL/hr over 30 Minutes Intravenous Every 24 hours 04/27/21 2324 04/29/21 1206        I have personally reviewed the following labs and images: CBC: Recent Labs  Lab 04/27/21 1729 04/28/21 1001 04/29/21 0550  WBC 7.6 3.7* 9.1  NEUTROABS 4.3  --   --   HGB 15.0 12.1 13.1  HCT 44.3 35.4* 38.0  MCV 82.6 82.9 83.9  PLT 258 186 173   BMP &GFR Recent Labs  Lab 04/27/21 1729 04/28/21 1001 04/29/21 0550 04/30/21 0456  NA 142 143 142 144  K 2.4* 5.4* 2.8* 3.6  CL 99 109 103 107  CO2 _0 GLUCOSE 125* 246* 232* 143*  BUN 7* _1 CREATININE  0.56 0.49  0.46 0.50 0.64  CALCIUM 8.9 6.9* 8.3* 8.6*  MG 1.0* 1.5* 2.1 2.0  PHOS  --  2.1* 3.3 2.1*   Estimated Creatinine Clearance: 54.9 mL/min (by C-G formula based on SCr of 0.64 mg/dL). Liver & Pancreas: Recent Labs  Lab 04/27/21 1729 04/29/21 0550  AST 24  --   ALT 13  --   ALKPHOS 85  --   BILITOT 0.6  --   PROT 7.5  --   ALBUMIN 3.9 3.0*   No results for input(s): LIPASE, AMYLASE in the last 168 hours. No results for input(s): AMMONIA in the last 168 hours. Diabetic: Recent Labs    04/28/21 1001  HGBA1C 7.2*   Recent Labs  Lab 04/29/21 1258 04/29/21 1725 04/29/21 2211 04/30/21 0811 04/30/21 1229  GLUCAP 109* 243* 113* 139* 146*   Cardiac Enzymes: No results for input(s): CKTOTAL, CKMB, CKMBINDEX, TROPONINI in the last 168 hours. No results for input(s): PROBNP in the last 8760 hours. Coagulation Profile: No results for input(s): INR, PROTIME in the last 168 hours. Thyroid Function Tests: No results for input(s): TSH, T4TOTAL, FREET4, T3FREE, THYROIDAB in  the last 72 hours. Lipid Profile: Recent Labs    04/28/21 1001  CHOL 120  HDL 42  LDLCALC 66  TRIG 62  CHOLHDL 2.9   Anemia Panel: No results for input(s): VITAMINB12, FOLATE, FERRITIN, TIBC, IRON, RETICCTPCT in the last 72 hours. Urine analysis:    Component Value Date/Time   COLORURINE YELLOW (A) 07/29/2020 2146   APPEARANCEUR HAZY (A) 07/29/2020 2146   APPEARANCEUR Clear 11/14/2011 1607   LABSPEC 1.018 07/29/2020 2146   LABSPEC 1.006 11/14/2011 1607   PHURINE 5.0 07/29/2020 2146   GLUCOSEU NEGATIVE 07/29/2020 2146   GLUCOSEU Negative 11/14/2011 1607   HGBUR NEGATIVE 07/29/2020 2146   BILIRUBINUR NEGATIVE 07/29/2020 2146   BILIRUBINUR Negative 11/14/2011 1607   KETONESUR NEGATIVE 07/29/2020 2146   PROTEINUR NEGATIVE 07/29/2020 2146   NITRITE NEGATIVE 07/29/2020 2146   LEUKOCYTESUR NEGATIVE 07/29/2020 2146   LEUKOCYTESUR Negative 11/14/2011 1607   Sepsis Labs: Invalid input(s): PROCALCITONIN, [place]  Microbiology: Recent Results (from the past 240 hour(s))  Resp Panel by RT-PCR (Flu A&B, Covid) Nasopharyngeal Swab     Status: None   Collection Time: 04/27/21 11:33 PM   Specimen: Nasopharyngeal Swab; Nasopharyngeal(NP) swabs in vial transport medium  Result Value Ref Range Status   SARS Coronavirus 2 by RT PCR NEGATIVE NEGATIVE Final    Comment: (NOTE) SARS-CoV-2 target nucleic acids are NOT DETECTED.  The SARS-CoV-2 RNA is generally detectable in upper respiratory specimens during the acute phase of infection. The lowest concentration of SARS-CoV-2 viral copies this assay can detect is 138 copies/mL. A negative result does not preclude SARS-Cov-2 infection and should not be used as the sole basis for treatment or other patient management decisions. A negative result may occur with  improper specimen collection/handling, submission of specimen other than nasopharyngeal swab, presence of viral mutation(s) within the areas targeted by this assay, and  inadequate number of viral copies(<138 copies/mL). A negative result must be combined with clinical observations, patient history, and epidemiological information. The expected result is Negative.  Fact Sheet for Patients:  EntrepreneurPulse.com.au  Fact Sheet for Healthcare Providers:  IncredibleEmployment.be  This test is no t yet approved or cleared by the [place] FDA and  has been authorized for detection and/or diagnosis of SARS-CoV-2 by FDA under an Emergency Use Authorization (EUA). This EUA will remain  in effect (meaning this test  can be used) for the duration of the COVID-19 declaration under Section 564(b)(1) of the Act, 21 U.S.C.section 360bbb-3(b)(1), unless the authorization is terminated  or revoked sooner.       Influenza A by PCR NEGATIVE NEGATIVE Final   Influenza B by PCR NEGATIVE NEGATIVE Final    Comment: (NOTE) The Xpert Xpress SARS-CoV-2/FLU/RSV plus assay is intended as an aid in the diagnosis of influenza from Nasopharyngeal swab specimens and should not be used as a sole basis for treatment. Nasal washings and aspirates are unacceptable for Xpert Xpress SARS-CoV-2/FLU/RSV testing.  Fact Sheet for Patients: EntrepreneurPulse.com.au  Fact Sheet for Healthcare Providers: IncredibleEmployment.be  This test is not yet approved or cleared by the [place] FDA and has been authorized for detection and/or diagnosis of SARS-CoV-2 by FDA under an Emergency Use Authorization (EUA). This EUA will remain in effect (meaning this test can be used) for the duration of the COVID-19 declaration under Section 564(b)(1) of the Act, 21 U.S.C. section 360bbb-3(b)(1), unless the authorization is terminated or revoked.  Performed at [facility], [address], [place], [place] [address]     Radiology Studies: No results found.    Val Riles, MD [facility]  If  7PM-7AM, please contact night-coverage [email or web address] 04/30/2021, 4:19 PM

## 2021-04-30 NOTE — Discharge Summary (Addendum)
[facility] Discharge Summary   Patient: [name] DPO:[id number]  PCP: [facility], [facility]  Date of admission: 04/27/2021   Date of discharge:  05/01/2021     Discharge Diagnoses:  Principal diagnosis Seizure disorder, need LTM EEG to rule out true seizures Active Problems:   COPD exacerbation ([place])   Breakthrough seizure ([place])   Admitted From: Home Disposition: Transfer to [facility] for LTM EEG as per neurology.  Recommendations for Outpatient Follow-up:  PCP: And neurology after discharge from [facility]  Follow up LABS/TEST:     Diet recommendation: Cardiac diet  Activity: The patient is advised to gradually reintroduce usual activities, as tolerated  Discharge Condition: stable  Code Status: Full code   History of present illness: As per the H and P dictated on admission Hospital Course:  70 year old F with PMH of COPD/RF on 2 L at night, combined CHF, liver cirrhosis, DM-2, HTN, RA, depression, seizure disorder, CVA, chronic diarrhea and impaired hearing presenting with increased shortness of breath, cough, wheeze, markedly elevated blood pressure, headache, dizziness and blurry vision.  Initially admitted for COPD exacerbation with uncontrolled hypertension.  However, she had a breakthrough seizure and strokelike symptoms.  Neurology consulted.  CT head and MRI brain without acute finding.  Carotid ultrasound with 50 to 69% bilateral ICA stenosis.    Assessment & Plan:   # Pseudoseizure # History seizure disorder Neuro following, thinks seizure-like activity here most consistent w/ pseudoseizure.  - resume keppra and lacosamide, consider weaning as outpt w/ neurology f/u - seizure precautions  10/25 neurology recommended that patient may have pseudoseizures, still having persistent episodes during hospital stay, we do not have LTM EEG monitoring so we cannot really rule out what is happening so patient is to be transferred to the facility  where LTM EEG monitoring can be done so plan was made to transfer to [facility] and neurology will be consulted.  Dr. Loann Quill will inform neurology at [facility].  # COPD exacerbation/chronic hypoxic RF-presented with increased shortness of breath, cough and wheeze.  Breathing has improved.  She is on 2 L by [place] at night. Cough not productive - hold abx in setting of diarrhea/loose stool - continue steroids -Wean oxygen as able 10/25 started Breo Ellipta inhaler, continue DuoNeb 3 times daily and continue albuterol as needed  Added Mucinex 600 mg twice daily and Robitussin-DM as needed   # Debility Patient interested in snf if qualifies - pt consult  Hypokalemia due to chronic diarrhea?  Lasix can contribute.  K 2.8 this morning, mg wnl - oral potassium 60 bid - monitor - loose stools as below     Hypophosphatemia, phos repleted.   Uncontrolled hypertension: BP 216/84 on arrival.  BP improved and remained normotensive. 10/25 again noticed high blood pressure, started hydralazine IV as needed - monitor   NIDDM-2 with hyperglycemia: Hyperglycemia likely from steroid.  On glimepiride at home. Last Labs          Recent Labs  Lab 04/29/21 1258 04/29/21 1725 04/29/21 2211 04/30/21 0811 04/30/21 1229  GLUCAP 109* 243* 113* 139* 146*    -SSI moderate -Add nightly coverage -Add mealtime coverage at 3 units -Follow hemoglobin A1c   Anxiety and depression -Continue home Celexa   Chronic diarrhea-evaluated for this by GI.  Colo showed polyps and nonbleeding internal hemorrhoid in 10/2020.  EGD the same time concerning for Barrett's esophagus, 2 cm hiatal hernia and gastric polyp. She tried Imodium and fiber without success  per patient's son  This morning one loose stool, not watery. Do not think infectious -Consider Lomotil if worsens; appears to be improving   Frequent urination-chronic issue.  She denies dysuria or acute change. -Continue ceftriaxone -Follow urine  culture  Esophageal dysphagia?  EGD in 10/2020 above.   -Continue PPI -May consider esophagram once seizure disorder under good control.   Tobacco use disorder-reports smoking about 4 to 5 cigarettes a day. -Encourage tobacco cessation -Nicotine patch while in-house   Impaired hearing/deaf S/p  ASL interpreter, but of note patient told SLP today she would prefer to communicate using lips    Body mass index is 23.91 kg/m.  Nutrition Interventions:  On the day of the discharge the patient's vitals were stable, and no other acute medical condition were reported by patient. the patient was felt safe to be discharge at [facility] for LTM EEG as per neurology   Consultants: Neurology Procedures: None  Discharge Exam: General: Appear in no distress, no Rash; Oral Mucosa Clear, moist. Cardiovascular: S1 and S2 Present, no Murmur, Respiratory: normal respiratory effort, Bilateral Air entry present and b/l Crackles, b/l wheezes Abdomen: Bowel Sound present, Soft and no tenderness, no hernia Extremities: no Pedal edema, no calf tenderness Neurology: alert and oriented to time, place, and person affect appropriate.  Filed Weights   04/27/21 1716  Weight: 61.2 kg   Vitals:   04/30/21 1645 04/30/21 1648  BP:  (!) 142/65  Pulse:  65  Resp:    Temp:  98.7 F (37.1 C)  SpO2: (!) 85% 93%    DISCHARGE MEDICATION: Allergies as of 04/30/2021       Reactions   Aspirin Itching   Celebrex [celecoxib] Itching   itching   Ciprofloxacin Itching   Codeine Itching   Fosphenytoin Itching   Levaquin [levofloxacin In D5w] Itching   Levofloxacin Itching   Lovastatin Itching   Penicillins    Documentation indicates severe reaction Pt tolerated cephalosporin without adverse reaction 09/18   Pravastatin Itching   Sulfa Antibiotics Itching        Medication List     TAKE these medications    acetaminophen 325 MG tablet Commonly known as: TYLENOL Take 2 tablets (650 mg total) by  mouth every 6 (six) hours as needed for mild pain (or Fever >/= 101).   albuterol 108 (90 Base) MCG/ACT inhaler Commonly known as: VENTOLIN HFA Inhale 2 puffs into the lungs every 4 (four) hours as needed for wheezing or shortness of breath.   albuterol (2.5 MG/3ML) 0.083% nebulizer solution Commonly known as: PROVENTIL Take 3 mLs (2.5 mg total) by nebulization every 4 (four) hours as needed for wheezing or shortness of breath.   citalopram 10 MG tablet Commonly known as: CELEXA Take 10 mg by mouth daily.   clopidogrel 75 MG tablet Commonly known as: PLAVIX Take 1 tablet (75 mg total) by mouth daily.   furosemide 40 MG tablet Commonly known as: LASIX Take 40 mg by mouth daily.   gabapentin 100 MG capsule Commonly known as: NEURONTIN Take 200 mg by mouth every evening.   glimepiride 2 MG tablet Commonly known as: AMARYL Take 2 mg by mouth daily.   guaiFENesin 600 MG 12 hr tablet Commonly known as: MUCINEX Take 2 tablets (1,200 mg total) by mouth 2 (two) times daily.   Lacosamide 150 MG Tabs Take 1 tablet (150 mg total) by mouth 2 (two) times daily.   levETIRAcetam 500 MG tablet Commonly known as: KEPPRA Take 1 tablet (  500 mg total) by mouth 2 (two) times daily.   LORazepam 2 MG/ML injection Commonly known as: ATIVAN Inject 0.5 mLs (1 mg total) into the vein every hour as needed for seizure.   losartan 100 MG tablet Commonly known as: COZAAR Take 100 mg by mouth daily.   metoprolol succinate 25 MG 24 hr tablet Commonly known as: TOPROL-XL Take 1 tablet (25 mg total) by mouth daily.   montelukast 10 MG tablet Commonly known as: SINGULAIR Take 10 mg by mouth at bedtime.   nicotine 14 mg/24hr patch Commonly known as: NICODERM CQ - dosed in mg/24 hours Place 1 patch (14 mg total) onto the skin daily.   omeprazole 20 MG capsule Commonly known as: PRILOSEC Take 2 capsules (40 mg total) by mouth daily.   pantoprazole 40 MG tablet Commonly known as:  PROTONIX Take 40 mg by mouth daily.   potassium chloride 10 MEQ tablet Commonly known as: KLOR-CON Take 2 tablets (20 mEq total) by mouth daily.   predniSONE 20 MG tablet Commonly known as: DELTASONE Take 2 tablets (40 mg total) by mouth daily with breakfast. Start taking on: May 01, 2021   rOPINIRole 0.5 MG tablet Commonly known as: REQUIP Take 0.5 mg by mouth at bedtime.   rosuvastatin 10 MG tablet Commonly known as: CRESTOR Take 10 mg by mouth at bedtime.   Symbicort 160-4.5 MCG/ACT inhaler Generic drug: budesonide-formoterol Inhale 2 puffs into the lungs 2 (two) times daily.       Allergies  Allergen Reactions   Aspirin Itching   Celebrex [Celecoxib] Itching    itching   Ciprofloxacin Itching   Codeine Itching   Fosphenytoin Itching   Levaquin [Levofloxacin In D5w] Itching   Levofloxacin Itching   Lovastatin Itching   Penicillins     Documentation indicates severe reaction  Pt tolerated cephalosporin without adverse reaction 09/18    Pravastatin Itching   Sulfa Antibiotics Itching   Discharge Instructions     Diet - low sodium heart healthy   Complete by: As directed    Increase activity slowly   Complete by: As directed        The results of significant diagnostics from this hospitalization (including imaging, microbiology, ancillary and laboratory) are listed below for reference.    Significant Diagnostic Studies: DG Chest 2 View  Result Date: 04/27/2021 CLINICAL DATA:  Shortness of breath and cough EXAM: CHEST - 2 VIEW COMPARISON:  Chest x-ray 07/29/2020, CT chest 08/14/2017 FINDINGS: The heart and mediastinal contours are unchanged. Aortic calcification. No focal consolidation. No pulmonary edema. No pleural effusion. No pneumothorax. No acute osseous abnormality. Total reversed right shoulder arthroplasty. IMPRESSION: No active cardiopulmonary disease. Electronically Signed   By: Iven Finn M.D.   On: 04/27/2021 17:51   DG Abd 1  View  Result Date: 04/28/2021 CLINICAL DATA:  Shortness of breath, cough and hypertension. EXAM: ABDOMEN - 1 VIEW COMPARISON:  CT AP 02/06/2017 FINDINGS: Cholecystectomy clips identified. The bowel gas pattern is normal. No radio-opaque calculi or other significant radiographic abnormality are seen. IMPRESSION: Negative.  Nonobstructive bowel gas pattern. Electronically Signed   By: Kerby Moors M.D.   On: 04/28/2021 05:29   CT Head Wo Contrast  Result Date: 04/28/2021 CLINICAL DATA:  Cerebral hemorrhage suspected, headache, hypertension EXAM: CT HEAD WITHOUT CONTRAST TECHNIQUE: Contiguous axial images were obtained from the base of the skull through the vertex without intravenous contrast. COMPARISON:  04/27/2021 FINDINGS: Brain: No evidence of acute infarction, hemorrhage, cerebral edema, mass, mass effect,  or midline shift. Ventricles and sulci are within normal limits for age. No extra-axial fluid collection. Periventricular white matter changes, likely the sequela of chronic small vessel ischemic disease. Vascular: No hyperdense vessel. Skull: Normal. Negative for fracture or focal lesion. Sinuses/Orbits: No acute finding. Other: Status post left canal wall mastoidectomy. IMPRESSION: No acute intracranial process. Electronically Signed   By: Merilyn Baba M.D.   On: 04/28/2021 00:46   CT Head Wo Contrast  Result Date: 04/27/2021 CLINICAL DATA:  Headache, elevated blood pressure, cough, and shortness of breath. EXAM: CT HEAD WITHOUT CONTRAST TECHNIQUE: Contiguous axial images were obtained from the base of the skull through the vertex without intravenous contrast. COMPARISON:  CT head dated 07/29/2020. FINDINGS: Brain: No evidence of acute infarction, hemorrhage, hydrocephalus, extra-axial collection or mass lesion/mass effect. Periventricular white matter hypoattenuation likely represents chronic small vessel ischemic disease. Vascular: There are vascular calcifications in the carotid siphons.  Skull: Negative for fracture or focal lesion. Sinuses/Orbits: The patient is status post a left canal wall up mastoidectomy. The paranasal sinuses are clear. Other: None. IMPRESSION: No acute intracranial process. Electronically Signed   By: Zerita Boers M.D.   On: 04/27/2021 18:13   MR BRAIN WO CONTRAST  Result Date: 04/28/2021 CLINICAL DATA:  70 year old female with headache, hypertensive, seizure. EXAM: MRI HEAD WITHOUT CONTRAST TECHNIQUE: Multiplanar, multiecho pulse sequences of the brain and surrounding structures were obtained without intravenous contrast. COMPARISON:  Head CT 0024 hours today.  Brain MRI 07/31/2020. FINDINGS: Brain: Stable cerebral volume since January. No restricted diffusion to suggest acute infarction. No midline shift, mass effect, evidence of mass lesion, ventriculomegaly, extra-axial collection or acute intracranial hemorrhage. Cervicomedullary junction and pituitary are within normal limits. Stable gray and white matter signal throughout the brain. Patchy and confluent bilateral white matter T2 and FLAIR hyperintensity. Mild to moderate patchy hyperintensity in the pons. No cortical encephalomalacia, chronic cerebral blood products, hippocampal or mesial temporal lobe asymmetry. Vascular: Major intracranial vascular flow voids are stable since January. Skull and upper cervical spine: Negative. Sinuses/Orbits: Postoperative changes to both globes, otherwise negative orbits. Mild left ethmoid sinus mucosal thickening has regressed since January. Other: Mastoids remain well aerated. Grossly normal visible internal auditory structures. Stable scalp soft tissues, left posterior convexity chronic scarring. IMPRESSION: No acute intracranial abnormality. Stable noncontrast MRI appearance of the brain since January with mild to moderate for age chronic white matter changes. Electronically Signed   By: Genevie Ann M.D.   On: 04/28/2021 06:26   US Carotid Bilateral (at [facility] and AP  only)  Result Date: 04/28/2021 CLINICAL DATA:  Evaluate carotid arteries EXAM: BILATERAL CAROTID DUPLEX ULTRASOUND TECHNIQUE: Pearline Cables scale imaging, color Doppler and duplex ultrasound were performed of bilateral carotid and vertebral arteries in the neck. COMPARISON:  06/18/2020 FINDINGS: Criteria: Quantification of carotid stenosis is based on velocity parameters that correlate the residual internal carotid diameter with NASCET-based stenosis levels, using the diameter of the distal internal carotid lumen as the denominator for stenosis measurement. The following velocity measurements were obtained: RIGHT ICA: 163.7 cm/sec CCA: 99.2 cm/sec SYSTOLIC ICA/CCA RATIO:  2.3 ECA: 192.5 cm/sec LEFT ICA: 212.3 cm/sec CCA: 42.6 cm/sec SYSTOLIC ICA/CCA RATIO:  2.5 ECA: 326.2 cm/sec RIGHT CAROTID ARTERY: Plaque at the carotid bulb. RIGHT VERTEBRAL ARTERY:  Antegrade LEFT CAROTID ARTERY: Plaque in the distal CCA, in the bulb, and in the proximal ICA and ECA LEFT VERTEBRAL ARTERY:  Antegrade IMPRESSION: 1. 50-69% stenosis in the bilateral ICAs. 2. Antegrade flow in vertebral arteries. Electronically Signed  By: Merilyn Baba M.D.   On: 04/28/2021 03:38    Microbiology: Recent Results (from the past 240 hour(s))  Resp Panel by RT-PCR (Flu A&B, Covid) Nasopharyngeal Swab     Status: None   Collection Time: 04/27/21 11:33 PM   Specimen: Nasopharyngeal Swab; Nasopharyngeal(NP) swabs in vial transport medium  Result Value Ref Range Status   SARS Coronavirus 2 by RT PCR NEGATIVE NEGATIVE Final    Comment: (NOTE) SARS-CoV-2 target nucleic acids are NOT DETECTED.  The SARS-CoV-2 RNA is generally detectable in upper respiratory specimens during the acute phase of infection. The lowest concentration of SARS-CoV-2 viral copies this assay can detect is 138 copies/mL. A negative result does not preclude SARS-Cov-2 infection and should not be used as the sole basis for treatment or other patient management decisions. A  negative result may occur with  improper specimen collection/handling, submission of specimen other than nasopharyngeal swab, presence of viral mutation(s) within the areas targeted by this assay, and inadequate number of viral copies(<138 copies/mL). A negative result must be combined with clinical observations, patient history, and epidemiological information. The expected result is Negative.  Fact Sheet for Patients:  EntrepreneurPulse.com.au  Fact Sheet for Healthcare Providers:  IncredibleEmployment.be  This test is no t yet approved or cleared by the [place] FDA and  has been authorized for detection and/or diagnosis of SARS-CoV-2 by FDA under an Emergency Use Authorization (EUA). This EUA will remain  in effect (meaning this test can be used) for the duration of the COVID-19 declaration under Section 564(b)(1) of the Act, 21 U.S.C.section 360bbb-3(b)(1), unless the authorization is terminated  or revoked sooner.       Influenza A by PCR NEGATIVE NEGATIVE Final   Influenza B by PCR NEGATIVE NEGATIVE Final    Comment: (NOTE) The Xpert Xpress SARS-CoV-2/FLU/RSV plus assay is intended as an aid in the diagnosis of influenza from Nasopharyngeal swab specimens and should not be used as a sole basis for treatment. Nasal washings and aspirates are unacceptable for Xpert Xpress SARS-CoV-2/FLU/RSV testing.  Fact Sheet for Patients: EntrepreneurPulse.com.au  Fact Sheet for Healthcare Providers: IncredibleEmployment.be  This test is not yet approved or cleared by the [place] FDA and has been authorized for detection and/or diagnosis of SARS-CoV-2 by FDA under an Emergency Use Authorization (EUA). This EUA will remain in effect (meaning this test can be used) for the duration of the COVID-19 declaration under Section 564(b)(1) of the Act, 21 U.S.C. section 360bbb-3(b)(1), unless the authorization  is terminated or revoked.  Performed at [facility], [address] [place], [place] [address]      Labs: CBC: Recent Labs  Lab 04/27/21 1729 04/28/21 1001 04/29/21 0550  WBC 7.6 3.7* 9.1  NEUTROABS 4.3  --   --   HGB 15.0 12.1 13.1  HCT 44.3 35.4* 38.0  MCV 82.6 82.9 83.9  PLT 258 186 945   Basic Metabolic Panel: Recent Labs  Lab 04/27/21 1729 04/28/21 1001 04/29/21 0550 04/30/21 0456  NA 142 143 142 144  K 2.4* 5.4* 2.8* 3.6  CL 99 109 103 107  CO2 _0 GLUCOSE 125* 246* 232* 143*  BUN 7* _1 CREATININE 0.56 0.49  0.46 0.50 0.64  CALCIUM 8.9 6.9* 8.3* 8.6*  MG 1.0* 1.5* 2.1 2.0  PHOS  --  2.1* 3.3 2.1*   Liver Function Tests: Recent Labs  Lab 04/27/21 1729 04/29/21 0550  AST 24  --   ALT 13  --  ALKPHOS 85  --   BILITOT 0.6  --   PROT 7.5  --   ALBUMIN 3.9 3.0*   No results for input(s): LIPASE, AMYLASE in the last 168 hours. No results for input(s): AMMONIA in the last 168 hours. Cardiac Enzymes: No results for input(s): CKTOTAL, CKMB, CKMBINDEX, TROPONINI in the last 168 hours. BNP (last 3 results) Recent Labs    07/31/20 0533 04/27/21 1729  BNP 140.5* 385.8*   CBG: Recent Labs  Lab 04/29/21 1725 04/29/21 2211 04/30/21 0811 04/30/21 1229 04/30/21 1644  GLUCAP 243* 113* 139* 146* 143*    Time spent: 35 minutes  Signed:  Val Sherman  [facility]  04/30/2021 6:17 PM

## 2021-05-01 ENCOUNTER — Inpatient Hospital Stay (HOSPITAL_COMMUNITY): Payer: Medicare Other

## 2021-05-01 ENCOUNTER — Inpatient Hospital Stay (HOSPITAL_COMMUNITY)
Admission: AD | Admit: 2021-05-01 | Discharge: 2021-05-05 | DRG: 100 | Disposition: A | Discharge status: Home / Self Care | Payer: Medicare Other | Source: Other Acute Inpatient Hospital | Attending: Internal Medicine | Admitting: Internal Medicine

## 2021-05-01 DIAGNOSIS — I6523 Occlusion and stenosis of bilateral carotid arteries: Secondary | ICD-10-CM | POA: Diagnosis present

## 2021-05-01 DIAGNOSIS — Z7952 Long term (current) use of systemic steroids: Secondary | ICD-10-CM

## 2021-05-01 DIAGNOSIS — I110 Hypertensive heart disease with heart failure: Secondary | ICD-10-CM | POA: Diagnosis present

## 2021-05-01 DIAGNOSIS — K7460 Unspecified cirrhosis of liver: Secondary | ICD-10-CM | POA: Diagnosis present

## 2021-05-01 DIAGNOSIS — E876 Hypokalemia: Secondary | ICD-10-CM | POA: Diagnosis present

## 2021-05-01 DIAGNOSIS — H9193 Unspecified hearing loss, bilateral: Secondary | ICD-10-CM | POA: Diagnosis present

## 2021-05-01 DIAGNOSIS — F445 Conversion disorder with seizures or convulsions: Secondary | ICD-10-CM | POA: Diagnosis present

## 2021-05-01 DIAGNOSIS — Z801 Family history of malignant neoplasm of trachea, bronchus and lung: Secondary | ICD-10-CM

## 2021-05-01 DIAGNOSIS — F32A Depression, unspecified: Secondary | ICD-10-CM | POA: Diagnosis present

## 2021-05-01 DIAGNOSIS — G40909 Epilepsy, unspecified, not intractable, without status epilepticus: Secondary | ICD-10-CM | POA: Diagnosis present

## 2021-05-01 DIAGNOSIS — Z881 Allergy status to other antibiotic agents status: Secondary | ICD-10-CM | POA: Diagnosis not present

## 2021-05-01 DIAGNOSIS — F419 Anxiety disorder, unspecified: Secondary | ICD-10-CM | POA: Diagnosis present

## 2021-05-01 DIAGNOSIS — Z96611 Presence of right artificial shoulder joint: Secondary | ICD-10-CM | POA: Diagnosis present

## 2021-05-01 DIAGNOSIS — K580 Irritable bowel syndrome with diarrhea: Secondary | ICD-10-CM | POA: Diagnosis present

## 2021-05-01 DIAGNOSIS — F17210 Nicotine dependence, cigarettes, uncomplicated: Secondary | ICD-10-CM | POA: Diagnosis present

## 2021-05-01 DIAGNOSIS — I5043 Acute on chronic combined systolic (congestive) and diastolic (congestive) heart failure: Secondary | ICD-10-CM | POA: Diagnosis present

## 2021-05-01 DIAGNOSIS — Z8673 Personal history of transient ischemic attack (TIA), and cerebral infarction without residual deficits: Secondary | ICD-10-CM | POA: Diagnosis not present

## 2021-05-01 DIAGNOSIS — Z803 Family history of malignant neoplasm of breast: Secondary | ICD-10-CM

## 2021-05-01 DIAGNOSIS — Z79899 Other long term (current) drug therapy: Secondary | ICD-10-CM

## 2021-05-01 DIAGNOSIS — Z7951 Long term (current) use of inhaled steroids: Secondary | ICD-10-CM

## 2021-05-01 DIAGNOSIS — T380X5A Adverse effect of glucocorticoids and synthetic analogues, initial encounter: Secondary | ICD-10-CM | POA: Diagnosis present

## 2021-05-01 DIAGNOSIS — Z7984 Long term (current) use of oral hypoglycemic drugs: Secondary | ICD-10-CM

## 2021-05-01 DIAGNOSIS — Z882 Allergy status to sulfonamides status: Secondary | ICD-10-CM

## 2021-05-01 DIAGNOSIS — Z7902 Long term (current) use of antithrombotics/antiplatelets: Secondary | ICD-10-CM

## 2021-05-01 DIAGNOSIS — Z885 Allergy status to narcotic agent status: Secondary | ICD-10-CM

## 2021-05-01 DIAGNOSIS — I509 Heart failure, unspecified: Secondary | ICD-10-CM

## 2021-05-01 DIAGNOSIS — Z9981 Dependence on supplemental oxygen: Secondary | ICD-10-CM

## 2021-05-01 DIAGNOSIS — E119 Type 2 diabetes mellitus without complications: Secondary | ICD-10-CM

## 2021-05-01 DIAGNOSIS — R0902 Hypoxemia: Secondary | ICD-10-CM

## 2021-05-01 DIAGNOSIS — G2581 Restless legs syndrome: Secondary | ICD-10-CM | POA: Diagnosis present

## 2021-05-01 DIAGNOSIS — G40919 Epilepsy, unspecified, intractable, without status epilepticus: Secondary | ICD-10-CM | POA: Diagnosis not present

## 2021-05-01 DIAGNOSIS — I10 Essential (primary) hypertension: Secondary | ICD-10-CM

## 2021-05-01 DIAGNOSIS — Z880 Allergy status to penicillin: Secondary | ICD-10-CM

## 2021-05-01 DIAGNOSIS — R569 Unspecified convulsions: Secondary | ICD-10-CM

## 2021-05-01 DIAGNOSIS — E1165 Type 2 diabetes mellitus with hyperglycemia: Secondary | ICD-10-CM | POA: Diagnosis present

## 2021-05-01 DIAGNOSIS — J441 Chronic obstructive pulmonary disease with (acute) exacerbation: Secondary | ICD-10-CM | POA: Diagnosis present

## 2021-05-01 DIAGNOSIS — Z8249 Family history of ischemic heart disease and other diseases of the circulatory system: Secondary | ICD-10-CM

## 2021-05-01 DIAGNOSIS — Z888 Allergy status to other drugs, medicaments and biological substances status: Secondary | ICD-10-CM

## 2021-05-01 DIAGNOSIS — J9611 Chronic respiratory failure with hypoxia: Secondary | ICD-10-CM | POA: Diagnosis present

## 2021-05-01 DIAGNOSIS — K7682 Hepatic encephalopathy: Secondary | ICD-10-CM | POA: Diagnosis present

## 2021-05-01 LAB — BASIC METABOLIC PANEL
Anion gap: 7 (ref 5–15)
BUN: 19 mg/dL (ref 8–23)
CO2: 26 mmol/L (ref 22–32)
Calcium: 8.2 mg/dL — ABNORMAL LOW (ref 8.9–10.3)
Chloride: 107 mmol/L (ref 98–111)
Creatinine, Ser: 0.57 mg/dL (ref 0.44–1.00)
GFR, Estimated: >60 mL/min (ref >60)
Glucose, Bld: 143 mg/dL — ABNORMAL HIGH (ref 70–99)
Potassium: 3.5 mmol/L (ref 3.5–5.1)
Sodium: 140 mmol/L (ref 135–145)

## 2021-05-01 LAB — CBC
HCT: 38.5 % (ref 36.0–46.0)
HCT: 40.3 % (ref 36.0–46.0)
Hemoglobin: 12.6 g/dL (ref 12.0–15.0)
Hemoglobin: 12.9 g/dL (ref 12.0–15.0)
MCH: 27.3 pg (ref 26.0–34.0)
MCH: 27 pg (ref 26.0–34.0)
MCHC: 32.7 g/dL (ref 30.0–36.0)
MCHC: 32 g/dL (ref 30.0–36.0)
MCV: 83.5 fL (ref 80.0–100.0)
MCV: 84.5 fL (ref 80.0–100.0)
Platelets: 187 10*3/uL (ref 150–400)
Platelets: 177 10*3/uL (ref 150–400)
RBC: 4.61 MIL/uL (ref 3.87–5.11)
RBC: 4.77 MIL/uL (ref 3.87–5.11)
RDW: 14.6 % (ref 11.5–15.5)
RDW: 14.6 % (ref 11.5–15.5)
WBC: 7 10*3/uL (ref 4.0–10.5)
WBC: 6.9 10*3/uL (ref 4.0–10.5)
nRBC: 0 % (ref 0.0–0.2)
nRBC: 0 % (ref 0.0–0.2)

## 2021-05-01 LAB — GLUCOSE, CAPILLARY
Glucose-Capillary: 101 mg/dL — ABNORMAL HIGH (ref 70–99)
Glucose-Capillary: 108 mg/dL — ABNORMAL HIGH (ref 70–99)
Glucose-Capillary: 109 mg/dL — ABNORMAL HIGH (ref 70–99)
Glucose-Capillary: 155 mg/dL — ABNORMAL HIGH (ref 70–99)
Glucose-Capillary: 155 mg/dL — ABNORMAL HIGH (ref 70–99)
Glucose-Capillary: 91 mg/dL (ref 70–99)

## 2021-05-01 LAB — MAGNESIUM: Magnesium: 1.9 mg/dL (ref 1.7–2.4)

## 2021-05-01 LAB — PHOSPHORUS: Phosphorus: 3.1 mg/dL (ref 2.5–4.6)

## 2021-05-01 MED ORDER — HYDRALAZINE HCL 20 MG/ML IJ SOLN
5.0000 mg | INTRAMUSCULAR | Status: DC | PRN
  Administered 2021-05-02: 5 mg via INTRAVENOUS
  Filled 2021-05-01: qty 1

## 2021-05-01 MED ORDER — LORAZEPAM 2 MG/ML IJ SOLN
INTRAMUSCULAR | Status: AC
  Administered 2021-05-01: 1 mg
  Filled 2021-05-01: qty 1

## 2021-05-01 MED ORDER — ALBUTEROL SULFATE (2.5 MG/3ML) 0.083% IN NEBU: 2.5000 mg | INHALATION_SOLUTION | RESPIRATORY_TRACT | Status: DC | PRN

## 2021-05-01 MED ORDER — LEVETIRACETAM IN NACL 500 MG/100ML IV SOLN
500.0000 mg | Freq: Two times a day (BID) | INTRAVENOUS | Status: DC
  Administered 2021-05-01 – 2021-05-04 (×6): 500 mg via INTRAVENOUS
  Filled 2021-05-01 (×6): qty 100

## 2021-05-01 MED ORDER — ACETAMINOPHEN 325 MG PO TABS
650.0000 mg | ORAL_TABLET | Freq: Four times a day (QID) | ORAL | Status: DC | PRN
  Filled 2021-05-01: qty 2

## 2021-05-01 MED ORDER — SODIUM CHLORIDE 0.9 % IV SOLN
100.0000 mg | Freq: Two times a day (BID) | INTRAVENOUS | Status: DC
  Administered 2021-05-01 – 2021-05-04 (×6): 100 mg via INTRAVENOUS
  Filled 2021-05-01 (×7): qty 10

## 2021-05-01 MED ORDER — ACETAMINOPHEN 650 MG RE SUPP: 650.0000 mg | Freq: Four times a day (QID) | RECTAL | Status: DC | PRN

## 2021-05-01 MED ORDER — IPRATROPIUM-ALBUTEROL 0.5-2.5 (3) MG/3ML IN SOLN
3.0000 mL | Freq: Four times a day (QID) | RESPIRATORY_TRACT | Status: DC
  Filled 2021-05-01 (×2): qty 3

## 2021-05-01 MED ORDER — POTASSIUM CHLORIDE 20 MEQ PO PACK
40.0000 meq | PACK | Freq: Once | ORAL | Status: AC
  Administered 2021-05-01: 40 meq via ORAL
  Filled 2021-05-01: qty 2

## 2021-05-01 MED ORDER — FLUTICASONE FUROATE-VILANTEROL 100-25 MCG/ACT IN AEPB
1.0000 | INHALATION_SPRAY | Freq: Every day | RESPIRATORY_TRACT | Status: DC
  Administered 2021-05-02 – 2021-05-05 (×4): 1 via RESPIRATORY_TRACT
  Filled 2021-05-01: qty 28

## 2021-05-01 MED ORDER — ENOXAPARIN SODIUM 40 MG/0.4ML IJ SOSY
40.0000 mg | PREFILLED_SYRINGE | INTRAMUSCULAR | Status: DC
  Administered 2021-05-01 – 2021-05-04 (×4): 40 mg via SUBCUTANEOUS
  Filled 2021-05-01 (×4): qty 0.4

## 2021-05-01 MED ORDER — PREDNISONE 20 MG PO TABS
40.0000 mg | ORAL_TABLET | Freq: Every day | ORAL | Status: DC
  Administered 2021-05-02 – 2021-05-03 (×2): 40 mg via ORAL
  Filled 2021-05-01 (×3): qty 2

## 2021-05-01 MED ORDER — INSULIN ASPART 100 UNIT/ML IJ SOLN: 0.0000 [IU] | INTRAMUSCULAR | Status: DC

## 2021-05-01 MED ORDER — IPRATROPIUM-ALBUTEROL 0.5-2.5 (3) MG/3ML IN SOLN: 3.0000 mL | Freq: Three times a day (TID) | RESPIRATORY_TRACT | Status: DC

## 2021-05-01 NOTE — Progress Notes (Signed)
VLTM started all impedances below 10kohms.  Atrium to monitor  Event button tested

## 2021-05-01 NOTE — Progress Notes (Signed)
Neurology Progress Note  S: Patient feels well but drowsy this AM. No further spells since yesterday evening. Awaiting transfer to [facility] for cEEG   Physical Exam Gen: A&Ox4, NAD HEENT: Atraumatic, normocephalic; oropharynx clear, tongue without atrophy or fasciculations. Resp: CTAB, normal work of breathing CV: RRR, extremities appear well-perfused. Abd: soft/NT/ND Extrem: Nml bulk; no cyanosis, clubbing, or edema.  Neuro: *MS: A&O x4. Follows multi-step commands.  *Speech: no dysarthria or aphasia, able to name and repeat. *CN:    I: Deferred   II,III: PERRLA, VFF by confrontation, optic discs not visualized 2/2 pupillary constriction   III,IV,VI: EOMI w/o nystagmus, no ptosis   V: Sensation intact from V1 to V3 to LT   VII: Eyelid closure was full.  Smile symmetric.   VIII: Hearing intact to voice   IX,X: Voice normal, palate elevates symmetrically    XI: SCM/trap 5/5 bilat   XII: Tongue protrudes midline, no atrophy or fasciculations  *Motor:   Normal bulk.  No tremor, rigidity or bradykinesia. No pronator drift.   Strength: Dlt Bic Tri WE WrF FgS Gr HF KnF KnE PlF DoF    Left _0 Right _1 *Sensory: Intact to light touch, pinprick, temperature vibration throughout. Symmetric. Propioception intact bilat.  No double-simultaneous extinction.  *Coordination:  Finger-to-nose, heel-to-shin, rapid alternating motions were intact. *Reflexes:  2+ and symmetric throughout without clonus; toes down-going bilat *Gait: deferred  MRI brain 04/28/21 showed no new findings.  A/P: 70 yo woman with hx epilepsy on vimpat and keppra as well as PNES with persistent spells 1-2x/day since admission despite correction of metabolic derangements.   - Awaiting transfer to [facility] for cEEG for spell characterization - Continue vimpat 149m bid and keppra 5041mbid - IV ativan for seizure lasting >5 min or spell with unstable vital signs - Will continue to  follow until transfer  CoSu MonksMD [facility] [phone] 7pm- 7am, please page neurology on call [place]

## 2021-05-01 NOTE — Progress Notes (Signed)
EEG complete - results pending. vLTM being started after this baseline

## 2021-05-01 NOTE — Progress Notes (Signed)
Physical Therapy Treatment Patient Details Name: [name] MRN: [id number] DOB: May 07, 1951 Today's Date: 05/01/2021   History of Present Illness Pt is a 70 y/o F admitted on 04/27/21 with c/c of SOB, HTN, & HA. Pt was initially admitted for COPD exacerbation with uncontrolled HTN but pt had a breakthrough seizure & stroke-like symptoms. Neurology was consulted by CT & MRI of brain was without acute findings. PMH: COPD/RF on 2L at night, combined CHF, liver cirrhosis, DM2, HTN, RA, depression, seizure disorder, CVA, chronic diarrhea & impaired hearing    PT Comments    Pt was asleep in side lying upon arriving. She has O2 [place] laying beside her in bed with sao2 82%. Reapplied O2 however needed increase to 4 L to achieve > 88%. Pt remained lethargic throughout session. She was easily able to progress from bed to short sit EOB however c/o severe head ache. Author observed untouched lunch tray at bedside and recommended pt continue to try to eat however pt is unwilling. She stood 1 x however unable to tolerate. She requested to return to bed and quickly falls back to sleep. Pt will greatly benefit from continued skilled PT to assist pt with returning to PLOF. Plan is for pt to be transferred to [facility] hospital for further work up.    Recommendations for follow up therapy are one component of a multi-disciplinary discharge planning process, led by the attending physician.  Recommendations may be updated based on patient status, additional functional criteria and insurance authorization.  Follow Up Recommendations  Skilled nursing-short term rehab (<3 hours/day)     Assistance Recommended at Discharge Frequent or constant Supervision/Assistance  Equipment Recommendations  Other (comment) (ongoing assessment. defer to next level of care)       Precautions / Restrictions Precautions Precautions: Fall Precaution Comments: seizure, deaf (prefers reads lips) Restrictions Weight Bearing Restrictions:  No     Mobility  Bed Mobility Overal bed mobility: Needs Assistance Bed Mobility: Supine to Sit;Sit to Supine     Supine to sit: Supervision Sit to supine: Supervision   General bed mobility comments: pt was asleep upon arrival but easily awakes. Pt has O2 [place] sitting in lap with sao2 82%. Re-applied [place] however needed to be increased to 4 L.    Transfers Overall transfer level: Needs assistance Equipment used: Rolling walker (2 wheels) Transfers: Sit to/from Stand Sit to Stand: Supervision           General transfer comment: pt was able to stand without physical assistance however c/o severe headache and requested holding on walking this session. quickly returns to sleeping once repositioned back in bed.    Ambulation/Gait      General Gait Details: Deferred this session due to pt not feeling well in static standing     Balance Overall balance assessment: Needs assistance Sitting-balance support: Feet supported;No upper extremity supported Sitting balance-Leahy Scale: Good Sitting balance - Comments: sat EOB without LOB or assistance   Standing balance support: No upper extremity supported Standing balance-Leahy Scale: Fair Standing balance comment: does have unsteadiness however no LOB. limited standing time due to pt reporting she does not feel well.       Cognition Arousal/Alertness: Lethargic Behavior During Therapy: WFL for tasks assessed/performed Overall Cognitive Status: Within Functional Limits for tasks assessed      General Comments: Pleasant lady, appears somewhat lethargic.               Pertinent Vitals/Pain Pain Assessment: 0-10 Pain Score: 6  Pain Location: headache Pain Descriptors / Indicators: Discomfort;Dull Pain Intervention(s): Limited activity within patient's tolerance;Monitored during session;Premedicated before session;Repositioned     PT Goals (current goals can now be found in the care plan section) Acute Rehab PT  Goals Patient Stated Goal: none stated Progress towards PT goals: Progressing toward goals    Frequency    Min 2X/week      PT Plan Current plan remains appropriate    Co-evaluation     PT goals addressed during session: Mobility/safety with mobility        AM-PAC PT "6 Clicks" Mobility   Outcome Measure  Help needed turning from your back to your side while in a flat bed without using bedrails?: None Help needed moving from lying on your back to sitting on the side of a flat bed without using bedrails?: A Little Help needed moving to and from a bed to a chair (including a wheelchair)?: A Little Help needed standing up from a chair using your arms (e.g., wheelchair or bedside chair)?: A Little Help needed to walk in hospital room?: A Little Help needed climbing 3-5 steps with a railing? : A Lot 6 Click Score: 18    End of Session Equipment Utilized During Treatment: Oxygen (pt without O2 on upon arriving with sao2 82%) Activity Tolerance: Patient limited by lethargy;Patient limited by fatigue;Other (comment) (limited by head ache and lethargy) Patient left: in bed;with call bell/phone within reach;with bed alarm set Nurse Communication: Mobility status PT Visit Diagnosis: Muscle weakness (generalized) (M62.81);Unsteadiness on feet (R26.81);Difficulty in walking, not elsewhere classified (R26.2)     Time: 7225-7505 PT Time Calculation (min) (ACUTE ONLY): 11 min  Charges:  $Therapeutic Activity: 8-22 mins                     Julaine Fusi PTA 05/01/21, 4:02 PM

## 2021-05-01 NOTE — Significant Event (Addendum)
Rapid Response Event Note   Reason for Call :  Seizure post arrival to 3W from [place].   Per RN, RN was called to bedside by the transport team after they transferred pt from stretcher to bed and pt began to have seizure-like activity. Per RN, pt was twitching B eyes, had a blank stare on her face, and was not responding. RN gave pt 54m ativan IV (order from [place]).   Initial Focused Assessment:  Pt lying in bed with eyes closed, in no distress. No seizure-like activity seen. Pt only responsive to sternal rub. She will grimace and move upper extremities to pain. Pupils 5, equal, and sluggish. After a few minutes, pt began to open her eyes spontaneously and move all extremities, however still very sleepy and not able to follow commands and will not speak. Skin warm to touch  T-98.8, HR-56, BP-160/67, RR-20, SpO2-94% on 3L [place]  Interventions:  EEG  Plan of Care:  Pt responsiveness is slowly improving. Dr. RMarlowe Saxcoming to beside to assess pt. Await MD orders. EEG tech in process of attaching EEG monitor to pt.  Continue to monitor closely. Call RRT if further assistance needed.   Event Summary:   MD Notified: Dr RMarlowe Saxnotified via [facility](pt is a new [facility]) and Dr. KLeonel Ramsaynotified(Neuro is consulting) Call T[place] HDillard [place] RN

## 2021-05-01 NOTE — H&P (Addendum)
History and Physical    SYDNY SCHNITZLER KKX:[id number] DOB: Nov 20, 1950 DOA: 05/01/2021  PCP: [facility] [place] Patient coming from: [facility]  Chief Complaint: Seizure-like activity  HPI: [name] is a 70 y.o. female with medical history significant of COPD, chronic respiratory failure on 2 L oxygen at night, chronic combined CHF, liver cirrhosis, non-insulin-dependent type 2 diabetes, hypertension, rheumatoid arthritis, depression, seizure disorder, CVA, IBS/chronic diarrhea, impaired hearing.  She was admitted to [facility] on 04/27/2021 for COPD exacerbation with uncontrolled hypertension.  However, had seizure-like activity and strokelike symptoms during this hospitalization.  CT head and MRI brain without acute finding.  Carotid ultrasound with 50 to 69% bilateral ICA stenosis.  Patient continued to have persistent episodes of seizure-like activity (1-2x/day since admission despite correction of metabolic derangements) which neurology felt were most consistent with psychogenic nonepileptic seizures.  Her home Vimpat 100 mg twice daily and Keppra 500 mg twice daily were continued.  Patient was transferred to [facility] for continuous EEG monitoring.  Informed by RN that upon arrival to [facility], as they were transferring the patient from the stretcher to the bed, she had another episode of seizure-like activity where she was blinking, blank stare, eyes rolled back and not responding.  Blood pressure elevated but otherwise vital signs stable during this episode.  CBG 155.  Patient was given 1 mg Ativan.  Somnolent at the time of my evaluation and not able to give any history.  Neurology notified.  Review of Systems:  All systems reviewed and apart from history of presenting illness, are negative.  Past Medical History:  Diagnosis Date   Asthma    CHF (congestive heart failure) (HCC)    Cirrhosis, non-alcoholic (HCC)    COPD (chronic obstructive  pulmonary disease) ([place])    Deaf    Depression    Diabetes mellitus without complication ([place])    GERD (gastroesophageal reflux disease)    Heart murmur    Hepatitis    History of rheumatic fever    History of scarlet fever    Hypertension    IBS (irritable bowel syndrome)    Lymph node disorder    arm   Neuropathy    On home oxygen therapy    hs   Orthopnea    Osteoarthritis    RA (rheumatoid arthritis) (HCC)    RLS (restless legs syndrome)    Seizures (HCC)    Shortness of breath dyspnea    Sleep apnea    Stroke ([place])    tia    Past Surgical History:  Procedure Laterality Date   ABDOMINAL HYSTERECTOMY     BREAST BIOPSY Right 10/30/2020   Stereo Bx, X-clip, path pending    CATARACT EXTRACTION W/PHACO Right 11/23/2014   Procedure: CATARACT EXTRACTION PHACO AND INTRAOCULAR LENS PLACEMENT ([place]);  Surgeon: Lyla Glassing, MD;  Location: [facility] ORS;  Service: Ophthalmology;  Laterality: Right;   CATARACT EXTRACTION W/PHACO Left 12/14/2014   Procedure: CATARACT EXTRACTION PHACO AND INTRAOCULAR LENS PLACEMENT (IOC);  Surgeon: Lyla Glassing, MD;  Location: [facility] ORS;  Service: Ophthalmology;  Laterality: Left;  US:01:16.6 AP:15.8 CDE:12.14   CESAREAN SECTION     CHOLECYSTECTOMY     COLONOSCOPY WITH PROPOFOL N/A 10/08/2020   Procedure: COLONOSCOPY WITH PROPOFOL;  Surgeon: Tol[place], Benay Pike, MD;  Location: [facility] ENDOSCOPY;  Service: Gastroenterology;  Laterality: N/A;   ESOPHAGOGASTRODUODENOSCOPY (EGD) WITH PROPOFOL N/A 10/08/2020   Procedure: ESOPHAGOGASTRODUODENOSCOPY (EGD) WITH PROPOFOL;  Surgeon: Alice Reichert, Benay Pike, MD;  Location: [facility]  ENDOSCOPY;  Service: Gastroenterology;  Laterality: N/A;  DM DEAF, NEEDS SIGN INTERPRETER PER SON   REVERSE SHOULDER ARTHROPLASTY Right 12/21/2019   Procedure: REVERSE SHOULDER ARTHROPLASTY;  Surgeon: Lovell Sheehan, MD;  Location: [facility] ORS;  Service: Orthopedics;  Laterality: Right;   THUMB ARTHROSCOPY     TONSILLECTOMY     TYMPANOPLASTY      muliple     reports that she has been smoking cigarettes. She started smoking about 53 years ago. She has a 26.00 pack-year smoking history. She has never used smokeless tobacco. She reports that she does not drink alcohol and does not use drugs.  Allergies  Allergen Reactions   Aspirin Itching   Celebrex [Celecoxib] Itching    itching   Ciprofloxacin Itching   Codeine Itching   Fosphenytoin Itching   Levaquin [Levofloxacin In D5w] Itching   Levofloxacin Itching   Lovastatin Itching   Penicillins     Documentation indicates severe reaction  Pt tolerated cephalosporin without adverse reaction 09/18    Pravastatin Itching   Sulfa Antibiotics Itching    Family History  Problem Relation Age of Onset   Lung cancer Mother    CAD Father    Breast cancer Sister     Prior to Admission medications   Medication Sig Start Date End Date Taking? Authorizing Provider  acetaminophen (TYLENOL) 325 MG tablet Take 2 tablets (650 mg total) by mouth every 6 (six) hours as needed for mild pain (or Fever >/= 101). Patient not taking: No sig reported 08/08/19   Thornell Mule, MD  albuterol (PROVENTIL HFA;VENTOLIN HFA) 108 (90 BASE) MCG/ACT inhaler Inhale 2 puffs into the lungs every 4 (four) hours as needed for wheezing or shortness of breath.    [provider]  albuterol (PROVENTIL) (2.5 MG/3ML) 0.083% nebulizer solution Take 3 mLs (2.5 mg total) by nebulization every 4 (four) hours as needed for wheezing or shortness of breath. Patient not taking: Reported on 04/28/2021 09/23/17   Alisa Graff, FNP  citalopram (CELEXA) 10 MG tablet Take 10 mg by mouth daily.    [provider]  clopidogrel (PLAVIX) 75 MG tablet Take 1 tablet (75 mg total) by mouth daily. 07/17/16   Vaughan Basta, MD  furosemide (LASIX) 40 MG tablet Take 40 mg by mouth daily. 07/22/18   [provider]  gabapentin (NEURONTIN) 100 MG capsule Take 200 mg by mouth every evening. 04/24/21    [provider]  glimepiride (AMARYL) 2 MG tablet Take 2 mg by mouth daily.    [provider]  guaiFENesin (MUCINEX) 600 MG 12 hr tablet Take 2 tablets (1,200 mg total) by mouth 2 (two) times daily. Patient not taking: Reported on 04/28/2021 08/01/20   Hosie Poisson, MD  lacosamide 150 MG TABS Take 1 tablet (150 mg total) by mouth 2 (two) times daily. 08/01/20   Hosie Poisson, MD  levETIRAcetam (KEPPRA) 500 MG tablet Take 1 tablet (500 mg total) by mouth 2 (two) times daily. 04/30/21   Val Riles, MD  LORazepam (ATIVAN) 2 MG/ML injection Inject 0.5 mLs (1 mg total) into the vein every hour as needed for seizure. 04/30/21   Val Riles, MD  losartan (COZAAR) 100 MG tablet Take 100 mg by mouth daily.    [provider]  metoprolol succinate (TOPROL-XL) 25 MG 24 hr tablet Take 1 tablet (25 mg total) by mouth daily. 08/08/19   Thornell Mule, MD  montelukast (SINGULAIR) 10 MG tablet Take 10 mg by mouth at bedtime.  03/18/16  [provider]  nicotine (NICODERM CQ - DOSED IN MG/24 HOURS) 14 mg/24hr patch Place 1 patch (14 mg total) onto the skin daily. Patient not taking: No sig reported 08/01/20   Hosie Poisson, MD  omeprazole (PRILOSEC) 20 MG capsule Take 2 capsules (40 mg total) by mouth daily. Patient not taking: No sig reported 08/01/20   Hosie Poisson, MD  pantoprazole (PROTONIX) 40 MG tablet Take 40 mg by mouth daily.    [provider]  potassium chloride (KLOR-CON) 10 MEQ tablet Take 2 tablets (20 mEq total) by mouth daily. 08/08/19 04/28/21  Thornell Mule, MD  predniSONE (DELTASONE) 20 MG tablet Take 2 tablets (40 mg total) by mouth daily with breakfast. 05/01/21   Val Riles, MD  rOPINIRole (REQUIP) 0.5 MG tablet Take 0.5 mg by mouth at bedtime.    [provider]  rosuvastatin (CRESTOR) 10 MG tablet Take 10 mg by mouth at bedtime. 04/21/17   [provider]  SYMBICORT 160-4.5 MCG/ACT inhaler Inhale 2 puffs into the lungs 2  (two) times daily.    [provider]    Physical Exam: Vitals:   05/01/21 1900 05/01/21 1912  BP: (!) 182/69 (!) 160/67  Pulse: (!) 58 (!) 56  Resp: 18 (!) 22  Temp: 97.8 F (36.6 C) 98.8 F (37.1 C)  TempSrc: Axillary Axillary    Physical Exam Constitutional:      General: She is not in acute distress.    Appearance: She is not diaphoretic.  HENT:     Head: Normocephalic and atraumatic.  Eyes:     Conjunctiva/sclera: Conjunctivae normal.  Cardiovascular:     Rate and Rhythm: Normal rate and regular rhythm.     Pulses: Normal pulses.  Pulmonary:     Effort: Pulmonary effort is normal. No respiratory distress.     Breath sounds: No rales.     Comments: Mild end expiratory wheezing Abdominal:     General: Bowel sounds are normal. There is no distension.     Palpations: Abdomen is soft.     Tenderness: There is no abdominal tenderness.  Musculoskeletal:        General: No swelling or tenderness.     Cervical back: Neck supple.  Skin:    General: Skin is warm and dry.  Neurological:     Comments: Somnolent Moving all extremities spontaneously.  No focal weakness.     Labs on Admission: I have personally reviewed following labs and imaging studies  CBC: Recent Labs  Lab 04/27/21 1729 04/28/21 1001 04/29/21 0550 05/01/21 0321  WBC 7.6 3.7* 9.1 7.0  NEUTROABS 4.3  --   --   --   HGB 15.0 12.1 13.1 12.6  HCT 44.3 35.4* 38.0 38.5  MCV 82.6 82.9 83.9 83.5  PLT 258 186 173 751   Basic Metabolic Panel: Recent Labs  Lab 04/27/21 1729 04/28/21 1001 04/29/21 0550 04/30/21 0456 05/01/21 0321  NA 142 143 142 144 140  K 2.4* 5.4* 2.8* 3.6 3.5  CL 99 109 103 107 107  CO2 _0 GLUCOSE 125* 246* 232* 143* 143*  BUN 7* _1 CREATININE 0.56 0.49  0.46 0.50 0.64 0.57  CALCIUM 8.9 6.9* 8.3* 8.6* 8.2*  MG 1.0* 1.5* 2.1 2.0 1.9  PHOS  --  2.1* 3.3 2.1* 3.1   GFR: Estimated Creatinine Clearance: 54.9 mL/min (by C-G formula based on SCr  of 0.57 mg/dL). Liver Function Tests: Recent Labs  Lab 04/27/21 1729  04/29/21 0550  AST 24  --   ALT 13  --   ALKPHOS 85  --   BILITOT 0.6  --   PROT 7.5  --   ALBUMIN 3.9 3.0*   No results for input(s): LIPASE, AMYLASE in the last 168 hours. No results for input(s): AMMONIA in the last 168 hours. Coagulation Profile: No results for input(s): INR, PROTIME in the last 168 hours. Cardiac Enzymes: No results for input(s): CKTOTAL, CKMB, CKMBINDEX, TROPONINI in the last 168 hours. BNP (last 3 results) No results for input(s): PROBNP in the last 8760 hours. HbA1C: No results for input(s): HGBA1C in the last 72 hours. CBG: Recent Labs  Lab 04/30/21 2241 05/01/21 0808 05/01/21 1144 05/01/21 1600 05/01/21 1859  GLUCAP 212* 101* 109* 155* 155*   Lipid Profile: No results for input(s): CHOL, HDL, LDLCALC, TRIG, CHOLHDL, LDLDIRECT in the last 72 hours. Thyroid Function Tests: No results for input(s): TSH, T4TOTAL, FREET4, T3FREE, THYROIDAB in the last 72 hours. Anemia Panel: No results for input(s): VITAMINB12, FOLATE, FERRITIN, TIBC, IRON, RETICCTPCT in the last 72 hours. Urine analysis:    Component Value Date/Time   COLORURINE YELLOW (A) 07/29/2020 2146   APPEARANCEUR HAZY (A) 07/29/2020 2146   APPEARANCEUR Clear 11/14/2011 1607   LABSPEC 1.018 07/29/2020 2146   LABSPEC 1.006 11/14/2011 1607   PHURINE 5.0 07/29/2020 2146   GLUCOSEU NEGATIVE 07/29/2020 2146   GLUCOSEU Negative 11/14/2011 1607   HGBUR NEGATIVE 07/29/2020 2146   BILIRUBINUR NEGATIVE 07/29/2020 2146   BILIRUBINUR Negative 11/14/2011 1607   KETONESUR NEGATIVE 07/29/2020 2146   PROTEINUR NEGATIVE 07/29/2020 2146   NITRITE NEGATIVE 07/29/2020 2146   LEUKOCYTESUR NEGATIVE 07/29/2020 2146   LEUKOCYTESUR Negative 11/14/2011 1607    Radiological Exams on Admission: No results found.  Assessment/Plan Principal Problem:   Seizure ([place]) Active Problems:   COPD exacerbation (HCC)   Uncontrolled  hypertension   Type 2 diabetes mellitus (HCC)   CHF (congestive heart failure) (HCC)   Seizure-like activity/ possible PNES History of seizure disorder At [facility], patient continued to have persistent episodes of seizure-like activity 1-2x/day since admission despite correction of metabolic derangements which neurology felt were most consistent with psychogenic nonepileptic seizures. Patient had another episode of seizure-like activity upon arrival to [facility] witnessed by nursing staff. She was given 1 mg Ativan by nursing staff and currently somnolent. -Neurology consulted and continuous EEG monitoring started.  Continue Vimpat 100 mg twice daily and Keppra 500 mg twice daily.  Seizure precautions.  COPD exacerbation Chronic hypoxemic respiratory failure Currently has mild end expiratory wheezing but satting well on 2 L home oxygen. -Continue Breo Ellipta.  DuoNeb every 6 hours scheduled and albuterol inhaler as needed.  Continue prednisone 40 mg daily and Mucinex when patient is more awake and able to take p.o. meds.  Already received a dose of prednisone today at [facility].  Continue supplemental oxygen.  Hypokalemia Hypophosphatemia Electrolytes replaced at [facility]. -Repeat labs  Uncontrolled hypertension Systolic currently in the 160s. -IV hydralazine prn  Non-insulin-dependent type 2 diabetes A1c 7.2 on 04/28/2021. -Sliding scale insulin sensitive every 4 hours at this time until patient is more awake and able to tolerate p.o. intake.  Chronic combined CHF Stable.  No signs of volume overload at this time.  DVT prophylaxis: Lovenox Code Status: Full code Family Communication: No family available at this time. Disposition Plan: Status is: Inpatient  Remains inpatient appropriate because: Frequent episodes of seizure-like activity, needs continuous EEG monitoring.  Level of care: Progressive  care  The medical decision making on this patient was of high complexity and the  patient is at high risk for clinical deterioration, therefore this is a level 3 visit.  Shela Leff MD [facility]  If 7PM-7AM, please contact night-coverage [email or web address]  05/01/2021, 9:02 PM

## 2021-05-01 NOTE — Progress Notes (Signed)
Patient arrived to the floor, she went to seizure immediately after putting her in bed at 1845, Lorazepam 1 mg given, patient still in seizure, MD paged and code rapid initiated. Will continue to monitor. VSS

## 2021-05-01 NOTE — TOC Initial Note (Signed)
Transition of Care [facility]) - Initial/Assessment Note    Patient Details  Name: [name] MRN: [id number] Date of Birth: Mar 06, 1951  [facility] CM/SW Contact:    Eileen Stanford, LCSW Phone Number: 05/01/2021, 3:22 PM  Clinical Narrative:   Pt is transferring to [facility]--awaiting bed.                      Patient Goals and CMS Choice        Expected Discharge Plan and Services           Expected Discharge Date: 04/30/21                                    Prior Living Arrangements/Services                       Activities of Daily Living      Permission Sought/Granted                  Emotional Assessment              Admission diagnosis:  TIA (transient ischemic attack) [G45.9] Hypokalemia [E87.6] COPD exacerbation ([place]) [J44.1] Breakthrough seizure ([place]) [G40.919] Secondary hypertension [I15.9] Chronic obstructive pulmonary disease, unspecified COPD type ([place]) [J44.9] Screening declined by patient [Z53.20] Patient Active Problem List   Diagnosis Date Noted   Breakthrough seizure ([place]) 04/28/2021   Aspiration pneumonia ([place]) 07/29/2020   S/P reverse total shoulder arthroplasty, right 12/21/2019   Comminuted fracture of right humerus 08/05/2019   History of CVA in adulthood 08/05/2019   COPD (chronic obstructive pulmonary disease) ([place])    Deaf    On home oxygen therapy    Cirrhosis, non-alcoholic ([place])    Asthma    Fall    Closed comminuted fracture of right humerus    Hypomagnesemia    PNA (pneumonia) 03/01/2018   Bronchitis 09/23/2017   Chronic diastolic heart failure ([place]) 08/26/2017   Tobacco use 08/26/2017   Seizure disorder ([place]) 06/15/2017   Goals of care, counseling/discussion 05/06/2017   Status epilepticus ([place])    CAP (community acquired pneumonia)    Hypoxia    Hypokalemia 02/12/2017   GERD (gastroesophageal reflux disease) 03/23/2016   Depression 03/23/2016   Diabetes mellitus  without complication ([place]) 80/16/5537   Essential hypertension 03/22/2016   Neuropathy 03/22/2016   Seizure ([place]) 03/22/2016   COPD exacerbation ([place]) 03/31/2015   PCP:  [facility], [employer]:   [place], [place] [address] Phone: [phone] Fax: [place], [place] [address] [place] [address] Phone: [phone] Fax: [phone]     Social Determinants of Health (SDOH) Interventions    Readmission Risk Interventions No flowsheet data found.

## 2021-05-01 NOTE — Progress Notes (Signed)
PROGRESS NOTE  [name] TKZ:[id number] DOB: 1950/10/17   PCP: [facility] [place]  Patient is from: Home  DOA: 04/27/2021 LOS: 4  Chief complaints:  Chief Complaint  Patient presents with   Shortness of Breath   Hypertension   Headache     Brief Narrative / Interim history: 70 year old F with PMH of COPD/RF on 2 L at night, combined CHF, liver cirrhosis, DM-2, HTN, RA, depression, seizure disorder, CVA, chronic diarrhea and impaired hearing presenting with increased shortness of breath, cough, wheeze, markedly elevated blood pressure, headache, dizziness and blurry vision.  Initially admitted for COPD exacerbation with uncontrolled hypertension.  However, she had a breakthrough seizure and strokelike symptoms.  Neurology consulted.  CT head and MRI brain without acute finding.  Carotid ultrasound with 50 to 69% bilateral ICA stenosis.   Subjective: No significant overnight events, patient was sleepy, woke up and denied any active issues.  Patient stated that shortness of breath is getting better, no new seizure-like activity that she is aware of. Patient was advised that she is being transferred to [facility] for LTM EEG, patient was little sleepy so I doubt whether she understood.    Objective: Vitals:   05/01/21 0720 05/01/21 0806 05/01/21 0900 05/01/21 1143  BP:  (!) 148/115  (!) 181/82  Pulse: (!) 54 67  64  Resp: 16     Temp:  97.7 F (36.5 C)  97.7 F (36.5 C)  TempSrc:  Oral  Oral  SpO2: (!) 85% (!) 88% 92% (!) 87%  Weight:      Height:        Intake/Output Summary (Last 24 hours) at 05/01/2021 1359 Last data filed at 04/30/2021 1955 Gross per 24 hour  Intake 720 ml  Output --  Net 720 ml   Filed Weights   04/27/21 1716  Weight: 61.2 kg    Examination:  GENERAL: No apparent distress.  Nontoxic. HEENT: MMM.  Vision grossly intact. NECK: Supple.  No apparent JVD.  RESP:  No IWOB.  Fair aeration bilaterally. CVS:  RRR. Heart  sounds normal.  ABD/GI/GU: BS+. Abd soft, NTND.  MSK/EXT:  Moves extremities. No apparent deformity. No edema.  Decreased ROM in right shoulder SKIN: no apparent skin lesion or wound NEURO: Awake, alert and oriented appropriately.  No apparent focal neuro deficit. PSYCH: Calm. Normal affect.   Procedures:  None  Microbiology summarized: [id number] and influenza PCR nonreactive.  Assessment & Plan:  # Pseudoseizure # History seizure disorder Neuro following, thinks seizure-like activity here most consistent w/ pseudoseizure.  - resume keppra and lacosamide, consider weaning as outpt w/ neurology f/u - seizure precautions 10/25 neurology recommended that patient may have pseudoseizures, still having persistent episodes during hospital stay, we do not have LTM EEG monitoring so we cannot really rule out what is happening so patient is to be transferred to the facility where LTM EEG monitoring can be done so plan was made to transfer to [facility] and neurology will be consulted.  Dr. Loann Quill will inform neurology at [facility]. So patient was discharged yesterday, still awaiting for bed availability.  # COPD exacerbation/chronic hypoxic RF-presented with increased shortness of breath, cough and wheeze.  Breathing has improved.  She is on 2 L by [place] at night. Cough not productive - hold abx in setting of diarrhea/loose stool - continue steroids -Wean oxygen as able 10/25 started Breo Ellipta inhaler, continue DuoNeb 3 times daily and continue albuterol as needed  Added Mucinex 600  mg twice daily and Robitussin-DM as needed  # Debility Patient interested in snf if qualifies - pt consult  Hypokalemia due to chronic diarrhea?  Lasix can contribute.  K 2.8 -----3.5 repleted, mg wnl - s/p oral potassium 60 bid - monitor - loose stools as below   Hypophosphatemia, phos repleted.  Uncontrolled hypertension: BP 216/84 on arrival.  BP improved and remained normotensive. 10/25 again  noticed high blood pressure, started hydralazine IV as needed - monitor  NIDDM-2 with hyperglycemia: Hyperglycemia likely from steroid.  On glimepiride at home. Recent Labs  Lab 04/30/21 1229 04/30/21 1644 04/30/21 2241 05/01/21 0808 05/01/21 1144  GLUCAP 146* 143* 212* 101* 109*  -SSI moderate -Add nightly coverage -Add mealtime coverage at 3 units -Follow hemoglobin A1c  Anxiety and depression -Continue home Celexa   Chronic diarrhea-evaluated for this by GI.  Colo showed polyps and nonbleeding internal hemorrhoid in 10/2020.  EGD the same time concerning for Barrett's esophagus, 2 cm hiatal hernia and gastric polyp. She tried Imodium and fiber without success per patient's son  This morning one loose stool, not watery. Do not think infectious -Consider Lomotil if worsens; appears to be improving  Frequent urination-chronic issue.  She denies dysuria or acute change. -Continue ceftriaxone -Follow urine culture  Esophageal dysphagia?  EGD in 10/2020 above.   -Continue PPI -May consider esophagram once seizure disorder under good control.  Tobacco use disorder-reports smoking about 4 to 5 cigarettes a day. -Encourage tobacco cessation -Nicotine patch while in-house  Impaired hearing/deaf S/p  ASL interpreter, but of note patient told SLP today she would prefer to communicate using lips   Body mass index is 23.91 kg/m.       DVT prophylaxis:  enoxaparin (LOVENOX) injection 40 mg Start: 04/28/21 0800  Code Status: Full code Family Communication: none @ bedside Level of care: Progressive Cardiac Status is: Inpatient  Remains inpatient appropriate because: Breakthrough seizure, electrolyte derangement   Consultants:  Neurology   Sch Meds:  Scheduled Meds:   stroke: mapping our early stages of recovery book   Does not apply Once   citalopram  20 mg Oral Daily   clopidogrel  75 mg Oral Daily   enoxaparin (LOVENOX) injection  40 mg Subcutaneous Q24H    fluticasone furoate-vilanterol  1 puff Inhalation Daily   guaiFENesin  600 mg Oral BID   insulin aspart  0-15 Units Subcutaneous TID WC   insulin aspart  0-5 Units Subcutaneous QHS   insulin aspart  3 Units Subcutaneous TID WC   ipratropium-albuterol  3 mL Nebulization TID   lacosamide  100 mg Oral BID   levETIRAcetam  500 mg Oral BID   losartan  25 mg Oral Daily   metoprolol succinate  25 mg Oral Q breakfast   montelukast  10 mg Oral QHS   nicotine  14 mg Transdermal Daily   pantoprazole  40 mg Oral Daily   predniSONE  40 mg Oral Q breakfast   rosuvastatin  10 mg Oral Daily   Continuous Infusions:   PRN Meds:.acetaminophen **OR** acetaminophen, albuterol, guaiFENesin-dextromethorphan, hydrALAZINE, LORazepam, ondansetron **OR** ondansetron (ZOFRAN) IV, traZODone  Antimicrobials: Anti-infectives (From admission, onward)    Start     Dose/Rate Route Frequency Ordered Stop   04/27/21 2330  cefTRIAXone (ROCEPHIN) 1 g in sodium chloride 0.9 % 100 mL IVPB  Status:  Discontinued        1 g 200 mL/hr over 30 Minutes Intravenous Every 24 hours 04/27/21 2324 04/29/21 1206  I have personally reviewed the following labs and images: CBC: Recent Labs  Lab 04/27/21 1729 04/28/21 1001 04/29/21 0550 05/01/21 0321  WBC 7.6 3.7* 9.1 7.0  NEUTROABS 4.3  --   --   --   HGB 15.0 12.1 13.1 12.6  HCT 44.3 35.4* 38.0 38.5  MCV 82.6 82.9 83.9 83.5  PLT 258 186 173 187   BMP &GFR Recent Labs  Lab 04/27/21 1729 04/28/21 1001 04/29/21 0550 04/30/21 0456 05/01/21 0321  NA 142 143 142 144 140  K 2.4* 5.4* 2.8* 3.6 3.5  CL 99 109 103 107 107  CO2 _0 GLUCOSE 125* 246* 232* 143* 143*  BUN 7* _1 CREATININE 0.56 0.49  0.46 0.50 0.64 0.57  CALCIUM 8.9 6.9* 8.3* 8.6* 8.2*  MG 1.0* 1.5* 2.1 2.0 1.9  PHOS  --  2.1* 3.3 2.1* 3.1   Estimated Creatinine Clearance: 54.9 mL/min (by C-G formula based on SCr of 0.57 mg/dL). Liver & Pancreas: Recent Labs  Lab  04/27/21 1729 04/29/21 0550  AST 24  --   ALT 13  --   ALKPHOS 85  --   BILITOT 0.6  --   PROT 7.5  --   ALBUMIN 3.9 3.0*   No results for input(s): LIPASE, AMYLASE in the last 168 hours. No results for input(s): AMMONIA in the last 168 hours. Diabetic: No results for input(s): HGBA1C in the last 72 hours.  Recent Labs  Lab 04/30/21 1229 04/30/21 1644 04/30/21 2241 05/01/21 0808 05/01/21 1144  GLUCAP 146* 143* 212* 101* 109*   Cardiac Enzymes: No results for input(s): CKTOTAL, CKMB, CKMBINDEX, TROPONINI in the last 168 hours. No results for input(s): PROBNP in the last 8760 hours. Coagulation Profile: No results for input(s): INR, PROTIME in the last 168 hours. Thyroid Function Tests: No results for input(s): TSH, T4TOTAL, FREET4, T3FREE, THYROIDAB in the last 72 hours. Lipid Profile: No results for input(s): CHOL, HDL, LDLCALC, TRIG, CHOLHDL, LDLDIRECT in the last 72 hours.  Anemia Panel: No results for input(s): VITAMINB12, FOLATE, FERRITIN, TIBC, IRON, RETICCTPCT in the last 72 hours. Urine analysis:    Component Value Date/Time   COLORURINE YELLOW (A) 07/29/2020 2146   APPEARANCEUR HAZY (A) 07/29/2020 2146   APPEARANCEUR Clear 11/14/2011 1607   LABSPEC 1.018 07/29/2020 2146   LABSPEC 1.006 11/14/2011 1607   PHURINE 5.0 07/29/2020 2146   GLUCOSEU NEGATIVE 07/29/2020 2146   GLUCOSEU Negative 11/14/2011 1607   HGBUR NEGATIVE 07/29/2020 2146   BILIRUBINUR NEGATIVE 07/29/2020 2146   BILIRUBINUR Negative 11/14/2011 1607   KETONESUR NEGATIVE 07/29/2020 2146   PROTEINUR NEGATIVE 07/29/2020 2146   NITRITE NEGATIVE 07/29/2020 2146   LEUKOCYTESUR NEGATIVE 07/29/2020 2146   LEUKOCYTESUR Negative 11/14/2011 1607   Sepsis Labs: Invalid input(s): PROCALCITONIN, [place]  Microbiology: Recent Results (from the past 240 hour(s))  Resp Panel by RT-PCR (Flu A&B, Covid) Nasopharyngeal Swab     Status: None   Collection Time: 04/27/21 11:33 PM   Specimen:  Nasopharyngeal Swab; Nasopharyngeal(NP) swabs in vial transport medium  Result Value Ref Range Status   SARS Coronavirus 2 by RT PCR NEGATIVE NEGATIVE Final    Comment: (NOTE) SARS-CoV-2 target nucleic acids are NOT DETECTED.  The SARS-CoV-2 RNA is generally detectable in upper respiratory specimens during the acute phase of infection. The lowest concentration of SARS-CoV-2 viral copies this assay can detect is 138 copies/mL. A negative result does not preclude SARS-Cov-2 infection and should not be used as the sole  basis for treatment or other patient management decisions. A negative result may occur with  improper specimen collection/handling, submission of specimen other than nasopharyngeal swab, presence of viral mutation(s) within the areas targeted by this assay, and inadequate number of viral copies(<138 copies/mL). A negative result must be combined with clinical observations, patient history, and epidemiological information. The expected result is Negative.  Fact Sheet for Patients:  EntrepreneurPulse.com.au  Fact Sheet for Healthcare Providers:  IncredibleEmployment.be  This test is no t yet approved or cleared by the [place] FDA and  has been authorized for detection and/or diagnosis of SARS-CoV-2 by FDA under an Emergency Use Authorization (EUA). This EUA will remain  in effect (meaning this test can be used) for the duration of the COVID-19 declaration under Section 564(b)(1) of the Act, 21 U.S.C.section 360bbb-3(b)(1), unless the authorization is terminated  or revoked sooner.       Influenza A by PCR NEGATIVE NEGATIVE Final   Influenza B by PCR NEGATIVE NEGATIVE Final    Comment: (NOTE) The Xpert Xpress SARS-CoV-2/FLU/RSV plus assay is intended as an aid in the diagnosis of influenza from Nasopharyngeal swab specimens and should not be used as a sole basis for treatment. Nasal washings and aspirates are unacceptable for  Xpert Xpress SARS-CoV-2/FLU/RSV testing.  Fact Sheet for Patients: EntrepreneurPulse.com.au  Fact Sheet for Healthcare Providers: IncredibleEmployment.be  This test is not yet approved or cleared by the [place] FDA and has been authorized for detection and/or diagnosis of SARS-CoV-2 by FDA under an Emergency Use Authorization (EUA). This EUA will remain in effect (meaning this test can be used) for the duration of the COVID-19 declaration under Section 564(b)(1) of the Act, 21 U.S.C. section 360bbb-3(b)(1), unless the authorization is terminated or revoked.  Performed at [facility], [address] [place], [place] [address]     Radiology Studies: No results found.    Val Riles, MD [facility]  If 7PM-7AM, please contact night-coverage [email or web address] 05/01/2021, 1:59 PM

## 2021-05-02 ENCOUNTER — Inpatient Hospital Stay (HOSPITAL_COMMUNITY): Payer: Medicare Other

## 2021-05-02 DIAGNOSIS — R569 Unspecified convulsions: Secondary | ICD-10-CM

## 2021-05-02 DIAGNOSIS — J441 Chronic obstructive pulmonary disease with (acute) exacerbation: Secondary | ICD-10-CM | POA: Diagnosis not present

## 2021-05-02 LAB — GLUCOSE, CAPILLARY
Glucose-Capillary: 85 mg/dL (ref 70–99)
Glucose-Capillary: 98 mg/dL (ref 70–99)
Glucose-Capillary: 235 mg/dL — ABNORMAL HIGH (ref 70–99)
Glucose-Capillary: 268 mg/dL — ABNORMAL HIGH (ref 70–99)
Glucose-Capillary: 258 mg/dL — ABNORMAL HIGH (ref 70–99)
Glucose-Capillary: 125 mg/dL — ABNORMAL HIGH (ref 70–99)

## 2021-05-02 LAB — COMPREHENSIVE METABOLIC PANEL
ALT: 23 U/L (ref 0–44)
AST: 32 U/L (ref 15–41)
Albumin: 2.9 g/dL — ABNORMAL LOW (ref 3.5–5.0)
Alkaline Phosphatase: 65 U/L (ref 38–126)
Anion gap: 9 (ref 5–15)
BUN: 13 mg/dL (ref 8–23)
CO2: 24 mmol/L (ref 22–32)
Calcium: 8.4 mg/dL — ABNORMAL LOW (ref 8.9–10.3)
Chloride: 109 mmol/L (ref 98–111)
Creatinine, Ser: 0.63 mg/dL (ref 0.44–1.00)
GFR, Estimated: >60 mL/min (ref >60)
Glucose, Bld: 87 mg/dL (ref 70–99)
Potassium: 4.4 mmol/L (ref 3.5–5.1)
Sodium: 142 mmol/L (ref 135–145)
Total Bilirubin: UNDETERMINED mg/dL (ref 0.3–1.2)
Total Protein: 5.6 g/dL — ABNORMAL LOW (ref 6.5–8.1)

## 2021-05-02 LAB — PHOSPHORUS: Phosphorus: 3.2 mg/dL (ref 2.5–4.6)

## 2021-05-02 LAB — AMMONIA: Ammonia: 46 umol/L — ABNORMAL HIGH (ref 9–35)

## 2021-05-02 LAB — MAGNESIUM: Magnesium: UNDETERMINED mg/dL (ref 1.7–2.4)

## 2021-05-02 MED ORDER — IPRATROPIUM-ALBUTEROL 0.5-2.5 (3) MG/3ML IN SOLN: 3.0000 mL | Freq: Four times a day (QID) | RESPIRATORY_TRACT | Status: DC

## 2021-05-02 MED ORDER — HYDRALAZINE HCL 20 MG/ML IJ SOLN: 5.0000 mg | INTRAMUSCULAR | Status: DC | PRN

## 2021-05-02 MED ORDER — AMLODIPINE BESYLATE 10 MG PO TABS
10.0000 mg | ORAL_TABLET | Freq: Every day | ORAL | Status: DC
  Administered 2021-05-02 – 2021-05-05 (×4): 10 mg via ORAL
  Filled 2021-05-02 (×4): qty 1

## 2021-05-02 MED ORDER — IPRATROPIUM-ALBUTEROL 0.5-2.5 (3) MG/3ML IN SOLN
3.0000 mL | Freq: Four times a day (QID) | RESPIRATORY_TRACT | Status: DC
  Administered 2021-05-02: 3 mL via RESPIRATORY_TRACT
  Filled 2021-05-02: qty 3

## 2021-05-02 MED ORDER — LACTULOSE 10 GM/15ML PO SOLN
20.0000 g | Freq: Two times a day (BID) | ORAL | Status: DC
  Administered 2021-05-02: 20 g via ORAL
  Filled 2021-05-02 (×2): qty 30

## 2021-05-02 MED ORDER — FUROSEMIDE 10 MG/ML IJ SOLN
40.0000 mg | Freq: Once | INTRAMUSCULAR | Status: AC
  Administered 2021-05-02: 40 mg via INTRAVENOUS
  Filled 2021-05-02: qty 4

## 2021-05-02 MED ORDER — FUROSEMIDE 10 MG/ML IJ SOLN
20.0000 mg | Freq: Once | INTRAMUSCULAR | Status: AC
  Administered 2021-05-02: 20 mg via INTRAVENOUS
  Filled 2021-05-02: qty 4

## 2021-05-02 NOTE — Procedures (Addendum)
Patient Name: [name]  MRN: [id number]  Epilepsy Attending: Lora Havens  Referring Physician/Provider: Dr Kathrynn Speed Duration: 05/01/2021 2106 to 05/02/2021 2106   Patient history: 70 year old female with breakthrough seizure in the setting of severe electrolyte derangement.  EEG to evaluate for seizure.   Level of alertness: Awake, asleep  AEDs during EEG study: LEV, LCM   Technical aspects: This EEG study was done with scalp electrodes positioned according to the 10-20 International system of electrode placement. Electrical activity was acquired at a sampling rate of _0  and reviewed with a high frequency filter of _1  and a low frequency filter of _2 . EEG data were recorded continuously and digitally stored.    Description: The posterior dominant rhythm consists of 7 Hz activity of moderate voltage (25-35 uV) seen predominantly in posterior head regions, symmetric and reactive to eye opening and eye closing. Sleep was characterized by vertex waves, sleep spindles (12 to 14 Hz), maximal frontocentral region. EEG showed continuous generalized 5 to 7 Hz theta slowing as well as intermittent generalized 2 to 3 Hz delta slowing.  Hyperventilation and photic stimulation were not performed.    Parts of study were difficult to interpret due to significant electrode artifact.   ABNORMALITY - Continuous slow, generalized   IMPRESSION: This study is suggestive of moderate diffuse encephalopathy, nonspecific etiology. No seizures or epileptiform discharges were seen throughout the recording.   Haillie Radu [name]

## 2021-05-02 NOTE — Progress Notes (Signed)
PROGRESS NOTE  [name] CVE:[id number] DOB: 12-May-1951   PCP: [facility]  Patient is from: Home  DOA: 05/01/2021 LOS: 1  Chief complaints:  No chief complaint on file.    Brief Narrative / Interim history: 70 year old F with PMH of COPD/RF on 2 L at night, chronic combined systolic and diastolic CHF, liver cirrhosis, DM-2, HTN, RA, depression, seizure disorder, CVA, chronic diarrhea and impaired hearing presenting with increased shortness of breath, cough, wheeze, markedly elevated blood pressure, headache, dizziness and blurry vision.  Initially admitted for COPD exacerbation with uncontrolled hypertension.  Throughout her hospitalization at [facility] she was noted to have seizure-like episodes, at 1 point there was a concern for CVA as well.  Neurology consulted.  CT head and MRI brain without acute finding.  Carotid ultrasound with 50 to 69% bilateral ICA stenosis.  -She was transferred to [facility] for continuous EEG monitoring   Assessment & Plan:  Suspected pseudoseizure History seizure disorder -Concern at [facility] you for possible pseudoseizures -Continued on home regimen of Keppra and Vimpat -Continuous EEG monitoring at this time, neurology following -MRI brain 10/23 was negative for acute findings, chronic white matter changes noted -PT OT eval -Also check ammonia level  Acute on chronic hypoxic respiratory failure - COPD exacerbation -Treated for COPD exacerbation at [facility], continues to have productive cough, mild respiratory distress, no overt wheezes at this time -Continue prednisone, add duo nebs -IV Lasix x1 today, check chest x-ray -Continue Mucinex  Acute on chronic combined CHF -Clinically appears slightly volume overloaded, last echo with EF of 50% -IV Lasix x1 today, check chest x-ray, monitor volume status closely  Accelerated hypertension -BP running higher, add amlodipine, IV Lasix today  Type 2 diabetes mellitus Steroid-induced  hyperglycemia -Glimepiride on hold, CBGs running lower but still normal while n.p.o., discontinue sliding scale insulin, monitor CBGs closely  History of liver cirrhosis -Check ammonia level with fluctuating mental status  Hypokalemia -Repleted  Hypophosphatemia, phos repleted.  Anxiety and depression -Continue home Celexa   Chronic diarrhea-previously evaluated for this by GI.  Colo showed polyps and nonbleeding internal hemorrhoid in 10/2020.  EGD the same time concerning for Barrett's esophagus, 2 cm hiatal hernia and gastric polyp. She tried Imodium and fiber without success per patient's son   -Consider Lomotil if worsens; appears to be improving  Tobacco use disorder  Impaired hearing/deaf -Can communicate using in person sign language interpreter or by reading lips when she is more alert      DVT prophylaxis:  enoxaparin (LOVENOX) injection 40 mg Start: 05/01/21 2200  Code Status: Full code Family Communication: No family at bedside, called and updated daughter Level of care: Progressive Status is: Inpatient  Subjective: -Per staff, seizure-like episodes overnight for which she was given a dose of IV Ativan  Objective: Vitals:   05/02/21 0341 05/02/21 0810 05/02/21 0836 05/02/21 0902  BP:  (!) 181/70 (!) 171/65 (!) 153/80  Pulse:  60 66 77  Resp: (!) 25 (!) 35 (!) 26 (!) 25  Temp: 97.9 F (36.6 C) 98.4 F (36.9 C) 98.4 F (36.9 C)   TempSrc: Oral Oral Oral   SpO2:  90%  (!) 89%    Intake/Output Summary (Last 24 hours) at 05/02/2021 1127 Last data filed at 05/02/2021 1028 Gross per 24 hour  Intake 125 ml  Output 1050 ml  Net -925 ml   There were no vitals filed for this visit.   Examination:  Gen: Average built female sitting up in bed,  somnolent but arousable, unable to assess orientation and extremely limited communication without sign language interpreter at bedside, mumbles a few words appropriately HEENT: Positive JVD CVS: S1-S2, regular rate  rhythm Lungs: Few scattered rhonchi and basilar rales Abdomen: Soft, nontender, bowel sounds present Extremities: No edema Skin: No rash on exposed skin Neuro: Moves all extremities, no localizing signs, flat affect, higher functioning difficult to assess  Procedures:  LTM  Consultants:  Neurology   Sch Meds:  Scheduled Meds:  enoxaparin (LOVENOX) injection  40 mg Subcutaneous Q24H   fluticasone furoate-vilanterol  1 puff Inhalation Daily   ipratropium-albuterol  3 mL Nebulization QID   predniSONE  40 mg Oral Q breakfast   Continuous Infusions:  lacosamide (VIMPAT) IV 100 mg (05/02/21 1028)   levETIRAcetam 500 mg (05/02/21 0917)    PRN Meds:.acetaminophen **OR** acetaminophen, albuterol, hydrALAZINE  Antimicrobials: Anti-infectives (From admission, onward)    None        I have personally reviewed the following labs and images: CBC: Recent Labs  Lab 04/27/21 1729 04/28/21 1001 04/29/21 0550 05/01/21 0321 05/01/21 2241  WBC 7.6 3.7* 9.1 7.0 6.9  NEUTROABS 4.3  --   --   --   --   HGB 15.0 12.1 13.1 12.6 12.9  HCT 44.3 35.4* 38.0 38.5 40.3  MCV 82.6 82.9 83.9 83.5 84.5  PLT 258 186 173 187 177   BMP &GFR Recent Labs  Lab 04/28/21 1001 04/29/21 0550 04/30/21 0456 05/01/21 0321 05/01/21 2241  NA 143 142 144 140 142  K 5.4* 2.8* 3.6 3.5 4.4  CL 109 103 107 107 109  CO2 _0 GLUCOSE 246* 232* 143* 143* 87  BUN _1 CREATININE 0.49  0.46 0.50 0.64 0.57 0.63  CALCIUM 6.9* 8.3* 8.6* 8.2* 8.4*  MG 1.5* 2.1 2.0 1.9 QUANTITY NOT SUFFICIENT, UNABLE TO PERFORM TEST  PHOS 2.1* 3.3 2.1* 3.1 3.2   Estimated Creatinine Clearance: 54.9 mL/min (by C-G formula based on SCr of 0.63 mg/dL). Liver & Pancreas: Recent Labs  Lab 04/27/21 1729 04/29/21 0550 05/01/21 2241  AST 24  --  32  ALT 13  --  23  ALKPHOS 85  --  65  BILITOT 0.6  --  QUANTITY NOT SUFFICIENT, UNABLE TO PERFORM TEST  PROT 7.5  --  5.6*  ALBUMIN 3.9 3.0* 2.9*   No  results for input(s): LIPASE, AMYLASE in the last 168 hours. No results for input(s): AMMONIA in the last 168 hours. Diabetic: No results for input(s): HGBA1C in the last 72 hours.  Recent Labs  Lab 05/01/21 1859 05/01/21 2125 05/01/21 2355 05/02/21 0403 05/02/21 0831  GLUCAP 155* 91 108* 85 98   Cardiac Enzymes: No results for input(s): CKTOTAL, CKMB, CKMBINDEX, TROPONINI in the last 168 hours. No results for input(s): PROBNP in the last 8760 hours. Coagulation Profile: No results for input(s): INR, PROTIME in the last 168 hours. Thyroid Function Tests: No results for input(s): TSH, T4TOTAL, FREET4, T3FREE, THYROIDAB in the last 72 hours. Lipid Profile: No results for input(s): CHOL, HDL, LDLCALC, TRIG, CHOLHDL, LDLDIRECT in the last 72 hours.  Anemia Panel: No results for input(s): VITAMINB12, FOLATE, FERRITIN, TIBC, IRON, RETICCTPCT in the last 72 hours. Urine analysis:    Component Value Date/Time   COLORURINE YELLOW (A) 07/29/2020 2146   APPEARANCEUR HAZY (A) 07/29/2020 2146   APPEARANCEUR Clear 11/14/2011 1607   LABSPEC 1.018 07/29/2020 2146   LABSPEC 1.006 11/14/2011 1607   PHURINE 5.0  07/29/2020 2146   GLUCOSEU NEGATIVE 07/29/2020 2146   GLUCOSEU Negative 11/14/2011 1607   HGBUR NEGATIVE 07/29/2020 2146   BILIRUBINUR NEGATIVE 07/29/2020 2146   BILIRUBINUR Negative 11/14/2011 [place] 07/29/2020 2146   PROTEINUR NEGATIVE 07/29/2020 2146   NITRITE NEGATIVE 07/29/2020 2146   LEUKOCYTESUR NEGATIVE 07/29/2020 2146   LEUKOCYTESUR Negative 11/14/2011 1607   Sepsis Labs: Invalid input(s): PROCALCITONIN, [place]  Microbiology: Recent Results (from the past 240 hour(s))  Resp Panel by RT-PCR (Flu A&B, Covid) Nasopharyngeal Swab     Status: None   Collection Time: 04/27/21 11:33 PM   Specimen: Nasopharyngeal Swab; Nasopharyngeal(NP) swabs in vial transport medium  Result Value Ref Range Status   SARS Coronavirus 2 by RT PCR NEGATIVE NEGATIVE  Final    Comment: (NOTE) SARS-CoV-2 target nucleic acids are NOT DETECTED.  The SARS-CoV-2 RNA is generally detectable in upper respiratory specimens during the acute phase of infection. The lowest concentration of SARS-CoV-2 viral copies this assay can detect is 138 copies/mL. A negative result does not preclude SARS-Cov-2 infection and should not be used as the sole basis for treatment or other patient management decisions. A negative result may occur with  improper specimen collection/handling, submission of specimen other than nasopharyngeal swab, presence of viral mutation(s) within the areas targeted by this assay, and inadequate number of viral copies(<138 copies/mL). A negative result must be combined with clinical observations, patient history, and epidemiological information. The expected result is Negative.  Fact Sheet for Patients:  EntrepreneurPulse.com.au  Fact Sheet for Healthcare Providers:  IncredibleEmployment.be  This test is no t yet approved or cleared by the [place] FDA and  has been authorized for detection and/or diagnosis of SARS-CoV-2 by FDA under an Emergency Use Authorization (EUA). This EUA will remain  in effect (meaning this test can be used) for the duration of the COVID-19 declaration under Section 564(b)(1) of the Act, 21 U.S.C.section 360bbb-3(b)(1), unless the authorization is terminated  or revoked sooner.       Influenza A by PCR NEGATIVE NEGATIVE Final   Influenza B by PCR NEGATIVE NEGATIVE Final    Comment: (NOTE) The Xpert Xpress SARS-CoV-2/FLU/RSV plus assay is intended as an aid in the diagnosis of influenza from Nasopharyngeal swab specimens and should not be used as a sole basis for treatment. Nasal washings and aspirates are unacceptable for Xpert Xpress SARS-CoV-2/FLU/RSV testing.  Fact Sheet for Patients: EntrepreneurPulse.com.au  Fact Sheet for Healthcare  Providers: IncredibleEmployment.be  This test is not yet approved or cleared by the [place] FDA and has been authorized for detection and/or diagnosis of SARS-CoV-2 by FDA under an Emergency Use Authorization (EUA). This EUA will remain in effect (meaning this test can be used) for the duration of the COVID-19 declaration under Section 564(b)(1) of the Act, 21 U.S.C. section 360bbb-3(b)(1), unless the authorization is terminated or revoked.  Performed at [facility], [address], [place], [place] [address]     Radiology Studies: EEG adult  Result Date: 06-01-2021 Lora Havens, MD     2021-06-01  9:17 AM Patient Name: [name] MRN: [id number] Epilepsy Attending: Lora Havens Referring Physician/Provider: Dr Derrick Ravel Date: 05/01/2021 Duration: 21.22 mins Patient history: 70 year old female with breakthrough seizure in the setting of severe electrolyte derangement.  EEG to evaluate for seizure. Level of alertness: Awake AEDs during EEG study: LEV, LCM Technical aspects: This EEG study was done with scalp electrodes positioned according to the 10-20 International system of electrode placement. Dealer  activity was acquired at a sampling rate of _0  and reviewed with a high frequency filter of _1  and a low frequency filter of _2 . EEG data were recorded continuously and digitally stored. Description: The posterior dominant rhythm consists of 7 Hz activity of moderate voltage (25-35 uV) seen predominantly in posterior head regions, symmetric and reactive to eye opening and eye closing.  EEG showed continuous generalized 5 to 7 Hz theta slowing as well as intermittent generalized 2 to 3 Hz delta slowing.  Hyperventilation and photic stimulation were not performed.   ABNORMALITY - Continuous slow, generalized IMPRESSION: This study is suggestive of moderate diffuse encephalopathy, nonspecific etiology. No seizures or epileptiform  discharges were seen throughout the recording. Priyanka [name]   Overnight EEG with video  Result Date: 05/02/2021 Lora Havens, MD     05/02/2021  9:34 AM Patient Name: [name] MRN: [id number] Epilepsy Attending: Lora Havens Referring Physician/Provider: Dr Kathrynn Speed Duration: 05/01/2021 2106 to 05/02/2021 0930  Patient history: 70 year old female with breakthrough seizure in the setting of severe electrolyte derangement.  EEG to evaluate for seizure.  Level of alertness: Awake AEDs during EEG study: LEV, LCM  Technical aspects: This EEG study was done with scalp electrodes positioned according to the 10-20 International system of electrode placement. Electrical activity was acquired at a sampling rate of _3  and reviewed with a high frequency filter of _4  and a low frequency filter of _5 . EEG data were recorded continuously and digitally stored.  Description: The posterior dominant rhythm consists of 7 Hz activity of moderate voltage (25-35 uV) seen predominantly in posterior head regions, symmetric and reactive to eye opening and eye closing.  EEG showed continuous generalized 5 to 7 Hz theta slowing as well as intermittent generalized 2 to 3 Hz delta slowing.  Hyperventilation and photic stimulation were not performed.    ABNORMALITY - Continuous slow, generalized  IMPRESSION: This study is suggestive of moderate diffuse encephalopathy, nonspecific etiology. No seizures or epileptiform discharges were seen throughout the recording.  Priyanka Laveda Norman, MD [facility]  If 7PM-7AM, please contact night-coverage [email or web address] 05/02/2021, 11:27 AM

## 2021-05-02 NOTE — Plan of Care (Signed)
  Problem: Education: Goal: Expressions of having a comfortable level of knowledge regarding the disease process will increase Outcome: Progressing   Problem: Coping: Goal: Ability to adjust to condition or change in health will improve Outcome: Progressing Goal: Ability to identify appropriate support needs will improve Outcome: Progressing   Problem: Health Behavior/Discharge Planning: Goal: Compliance with prescribed medication regimen will improve Outcome: Progressing   Problem: Medication: Goal: Risk for medication side effects will decrease Outcome: Progressing   Problem: Clinical Measurements: Goal: Complications related to the disease process, condition or treatment will be avoided or minimized Outcome: Progressing Goal: Diagnostic test results will improve Outcome: Progressing   Problem: Safety: Goal: Verbalization of understanding the information provided will improve Outcome: Progressing   Problem: Clinical Measurements: Goal: Diagnostic test results will improve Outcome: Progressing   Problem: Safety: Goal: Verbalization of understanding the information provided will improve Outcome: Progressing   Problem: Self-Concept: Goal: Level of anxiety will decrease Outcome: Progressing Goal: Ability to verbalize feelings about condition will improve Outcome: Progressing

## 2021-05-02 NOTE — Consult Note (Signed)
Neurology Consultation Reason for consult: Seizure in a known epilepsy patient.   Referring Physician: Fanny Bien, MD.   CC: seizure.  History is obtained from: chart   HPI: [name] is a 70 y.o. female with a past medical history significant for deafness requiring ASL interpreter, known seizure disorder as well as suspected PNES, congestive heart failure, cirrhosis, COPD, depression, RLS, diabetes, hypertension, possible rheumatoid arthritis (not on immunosuppression on review of her medications with son) who presented with breakthrough seizure in the setting of severe electrolyte derangements.  Patient was originally seen at [facility] in consult, and then transferred to [facility] for cEEG. She was having at least 1-2 spells per day consistent with seizure vs. PNES for which she always received Ativan by RNs, making it difficult to check postictal behavior. Her spells at [facility] varied with focal motor, staring, blinking, or decreased responsiveness.   Interval history here: cLTM in place. In patient's exam, she is not following commands or responding to questions, but she is deaf without an interpretor at bedside. RN states family may come today and can interpret.   Per RN, overnight RN reported patient had what was reported as seizure activity, but did not report the observation of the activity.   Per consult note:   "Last inpatient neurology evaluation was 07/30/2020 by Dr. Rory Percy for breakthrough seizures and is previously followed by Dr. Delice Lesch has noted focal seizures as well as possibilities of nonepileptic spells.  Hospitalizations and medical stressors are frequent triggers for her seizures, in particular her son notes that she is frequently hypokalemic as a trigger   She was last seen by Dr. Trena Platt office 03/18/2021 and noted she was having severe headaches felt to be secondary to her Vimpat with plan to gradually down titrate her dose to 100 mg twice daily.  Per review of her blister pack she  is currently taking 150 mg twice daily and not taking any Keppra."  ROS: A robust ROS could not be performed due to deafness.   Past Medical History:  Diagnosis Date   Asthma    CHF (congestive heart failure) (HCC)    Cirrhosis, non-alcoholic (HCC)    COPD (chronic obstructive pulmonary disease) ([place])    Deaf    Depression    Diabetes mellitus without complication (HCC)    GERD (gastroesophageal reflux disease)    Heart murmur    Hepatitis    History of rheumatic fever    History of scarlet fever    Hypertension    IBS (irritable bowel syndrome)    Lymph node disorder    arm   Neuropathy    On home oxygen therapy    hs   Orthopnea    Osteoarthritis    RA (rheumatoid arthritis) (HCC)    RLS (restless legs syndrome)    Seizures (HCC)    Shortness of breath dyspnea    Sleep apnea    Stroke ([place])    tia   Family History  Problem Relation Age of Onset   Lung cancer Mother    CAD Father    Breast cancer Sister    Social History:   reports that she has been smoking cigarettes. She started smoking about 53 years ago. She has a 26.00 pack-year smoking history. She has never used smokeless tobacco. She reports that she does not drink alcohol and does not use drugs.  Medications  Current Facility-Administered Medications:    acetaminophen (TYLENOL) tablet 650 mg, 650 mg, Oral, Q6H PRN **OR** acetaminophen (  TYLENOL) suppository 650 mg, 650 mg, Rectal, Q6H PRN, Shela Leff, MD   albuterol (PROVENTIL) (2.5 MG/3ML) 0.083% nebulizer solution 2.5 mg, 2.5 mg, Nebulization, Q4H PRN, Shela Leff, MD   enoxaparin (LOVENOX) injection 40 mg, 40 mg, Subcutaneous, Q24H, Shela Leff, MD, 40 mg at 05/01/21 2140   fluticasone furoate-vilanterol (BREO ELLIPTA) 100-25 MCG/ACT 1 puff, 1 puff, Inhalation, Daily, Shela Leff, MD   furosemide (LASIX) injection 40 mg, 40 mg, Intravenous, Once, Domenic Polite, MD   hydrALAZINE (APRESOLINE) injection 5 mg, 5 mg,  Intravenous, Q4H PRN, Shela Leff, MD, 5 mg at 05/02/21 0815   ipratropium-albuterol (DUONEB) 0.5-2.5 (3) MG/3ML nebulizer solution 3 mL, 3 mL, Nebulization, QID, Domenic Polite, MD   lacosamide (VIMPAT) 100 mg in sodium chloride 0.9 % 25 mL IVPB, 100 mg, Intravenous, Q12H, Shela Leff, MD, Last Rate: 70 mL/hr at 05/01/21 2214, 100 mg at 05/01/21 2214   levETIRAcetam (KEPPRA) IVPB 500 mg/100 mL premix, 500 mg, Intravenous, Q12H, Shela Leff, MD, Last Rate: 400 mL/hr at 05/01/21 2146, 500 mg at 05/01/21 2146   predniSONE (DELTASONE) tablet 40 mg, 40 mg, Oral, Q breakfast, Shela Leff, MD  Exam: Current vital signs: BP (!) 183/75 (BP Location: Left Arm)   Pulse (!) 53   Temp 97.9 F (36.6 C) (Oral)   Resp (!) 25   SpO2 95%  Vital signs in last 24 hours: Temp:  [97.6 F (36.4 C)-98.8 F (37.1 C)] 97.9 F (36.6 C) (10/27 0341) Pulse Rate:  [52-72] 53 (10/26 2112) Resp:  [18-25] 25 (10/27 0341) BP: (152-183)/(51-82) 183/75 (10/26 2312) SpO2:  [85 %-95 %] 95 % (10/26 2012)  PE: GENERAL:  Chronically ill appearing female. Awake. Lethargic, but this is questionable due to deafness. Awake, alert in NAD. HEENT: - Normocephalic and atraumatic. LUNGS - Normal respiratory effort.  CV - RRR. ABDOMEN - Soft, nontender. Ext: warm, well perfused. Psych: says "please" over and over.   NEURO:  Mental Status: Awake and opens eyes to noxious stimuli. Difficult to assess throughly due to deafness. Not following commands, but unable to hear NP.  Speech/Language: speech is without dysarthria or aphasia with ruminating on "please".  Unable to assess naming, repetition, fluency, or comprehension due to deafness.  Cranial Nerves:  II: PERRL 61m/brisk.  III, IV, VI: Lid elevation symmetric and full.  V, VII: face is symmetrical at rest.   VIII: deaf.   XI: unable to follow commands due to deafness.  XII: unable to follow commands due to deafness.  Motor:  Moves all  extremities Tone is normal. Bulk is normal.  Sensation- Intact to light touch bilaterally in all four extremities. Extinction absent to light touch to DSS.  Coordination: FTN intact bilaterally. HKS intact bilaterally. No drift.  DTRs: biceps  R     L   brachioradialis  R    L   patella  R      L Cerebellar: No tremor, clonus Gait: deferred  Labs Ammonia 46. K up to 4.4 from 2.8.  CBC    Component Value Date/Time   WBC 6.9 05/01/2021 2241   RBC 4.77 05/01/2021 2241   HGB 12.9 05/01/2021 2241   HGB 15.3 11/05/2013 1138   HCT 40.3 05/01/2021 2241   HCT 44.8 11/05/2013 1138   PLT 177 05/01/2021 2241   PLT 214 11/05/2013 1138   MCV 84.5 05/01/2021 2241   MCV 85 11/05/2013 1138   MCH 27.0 05/01/2021 2241   MCHC 32.0 05/01/2021 2241   RDW 14.6 05/01/2021 2241  RDW 13.3 11/05/2013 1138   LYMPHSABS 2.6 04/27/2021 1729   LYMPHSABS 1.4 04/24/2013 0614   MONOABS 0.5 04/27/2021 1729   MONOABS 0.2 04/24/2013 0614   EOSABS 0.1 04/27/2021 1729   EOSABS 0.0 04/24/2013 0614   BASOSABS 0.0 04/27/2021 1729   BASOSABS 0.0 04/24/2013 0614    CMP     Component Value Date/Time   NA 142 05/01/2021 2241   NA 139 11/05/2013 1138   K 4.4 05/01/2021 2241   K 3.3 (L) 11/05/2013 1138   CL 109 05/01/2021 2241   CL 103 11/05/2013 1138   CO2 24 05/01/2021 2241   CO2 29 11/05/2013 1138   GLUCOSE 87 05/01/2021 2241   GLUCOSE 219 (H) 11/05/2013 1138   BUN 13 05/01/2021 2241   BUN 9 11/05/2013 1138   CREATININE 0.63 05/01/2021 2241   CREATININE 0.35 (L) 11/05/2013 1138   CALCIUM 8.4 (L) 05/01/2021 2241   CALCIUM 8.9 11/05/2013 1138   PROT 5.6 (L) 05/01/2021 2241   PROT 7.0 11/05/2013 1138   ALBUMIN 2.9 (L) 05/01/2021 2241   ALBUMIN 3.4 11/05/2013 1138   AST 32 05/01/2021 2241   AST 48 (H) 11/05/2013 1138   ALT 23 05/01/2021 2241   ALT 35 11/05/2013 1138   ALKPHOS 65 05/01/2021 2241   ALKPHOS 145 (H) 11/05/2013 1138   BILITOT QUANTITY NOT SUFFICIENT, UNABLE TO PERFORM TEST 05/01/2021 2241    BILITOT 0.3 11/05/2013 1138   GFRNONAA >60 05/01/2021 2241   GFRNONAA >60 11/05/2013 1138   GFRAA >60 12/24/2019 0514   GFRAA >60 11/05/2013 1138   Imaging  CT head 04/28/21 negative for acute intracranial process.   cEEG In place.   Assessment: 70 yo female who presented 21 hours ago with breakthrough seizure in setting of metabolic derangements. She has a documented history of epilepsy and some PNES. Will continue her Vimpat plus Keppra for now and if cEEG negative, she may be able to wean Keppra as an outpatient as she was not taking it prior to this admission. The provocation for her seizure is likely the metabolic derangements as there has been no infection found at this time.   Recommendations: - Continue vimpat 145m bid and keppra 5023mbid - IV ativan for seizure lasting >5 min or spell with unstable vital signs -cEEG to further discern presence of seizure vs PNES.  -Will follow cEEG results.   Pt seen by KaClance BollNP/Neuro and later by MD. Note/plan to be edited by MD as needed.  Pager: [phone]  NEUROHOSPITALIST ADDENDUM Performed a face to face diagnostic evaluation.   I have reviewed the contents of history and physical exam as documented by PA/ARNP/Resident and agree with above documentation.  I have discussed and formulated the above plan as documented. Edits to the note have been made as needed.  She has epileptic seizures as well as PNES. Will use cEEG to guide AED adjustments.   SaDonnetta SimpersMD [facility] [phone] If 7pm to 7am, please call on call as listed on AMION.

## 2021-05-02 NOTE — Progress Notes (Signed)
LTM maint complete - no skin breakdown under: A1 F4 Fp2 Maintenance A1 C4 F4  [facility] monitored, Event button test confirmed by [facility].

## 2021-05-02 NOTE — Progress Notes (Signed)
Maint complete. No skin breakdown at Fp1, Fp2, A2

## 2021-05-02 NOTE — Procedures (Signed)
Patient Name: [name]  MRN: [id number]  Epilepsy Attending: Lora Havens  Referring Physician/Provider: Dr Derrick Ravel Date: 05/01/2021 Duration: 21.22 mins  Patient history: 70 year old female with breakthrough seizure in the setting of severe electrolyte derangement.  EEG to evaluate for seizure.  Level of alertness: Awake  AEDs during EEG study: LEV, LCM  Technical aspects: This EEG study was done with scalp electrodes positioned according to the 10-20 International system of electrode placement. Electrical activity was acquired at a sampling rate of _0  and reviewed with a high frequency filter of _1  and a low frequency filter of _2 . EEG data were recorded continuously and digitally stored.   Description: The posterior dominant rhythm consists of 7 Hz activity of moderate voltage (25-35 uV) seen predominantly in posterior head regions, symmetric and reactive to eye opening and eye closing.  EEG showed continuous generalized 5 to 7 Hz theta slowing as well as intermittent generalized 2 to 3 Hz delta slowing.  Hyperventilation and photic stimulation were not performed.     ABNORMALITY - Continuous slow, generalized  IMPRESSION: This study is suggestive of moderate diffuse encephalopathy, nonspecific etiology. No seizures or epileptiform discharges were seen throughout the recording.  Tiasha Helvie [name]

## 2021-05-03 DIAGNOSIS — J441 Chronic obstructive pulmonary disease with (acute) exacerbation: Secondary | ICD-10-CM | POA: Diagnosis not present

## 2021-05-03 DIAGNOSIS — R569 Unspecified convulsions: Secondary | ICD-10-CM | POA: Diagnosis not present

## 2021-05-03 LAB — BASIC METABOLIC PANEL
Anion gap: 9 (ref 5–15)
BUN: 14 mg/dL (ref 8–23)
CO2: 29 mmol/L (ref 22–32)
Calcium: 8.8 mg/dL — ABNORMAL LOW (ref 8.9–10.3)
Chloride: 102 mmol/L (ref 98–111)
Creatinine, Ser: 0.66 mg/dL (ref 0.44–1.00)
GFR, Estimated: >60 mL/min (ref >60)
Glucose, Bld: 99 mg/dL (ref 70–99)
Potassium: 2.9 mmol/L — ABNORMAL LOW (ref 3.5–5.1)
Sodium: 140 mmol/L (ref 135–145)

## 2021-05-03 LAB — CBC
HCT: 40.9 % (ref 36.0–46.0)
Hemoglobin: 13.6 g/dL (ref 12.0–15.0)
MCH: 27.7 pg (ref 26.0–34.0)
MCHC: 33.3 g/dL (ref 30.0–36.0)
MCV: 83.3 fL (ref 80.0–100.0)
Platelets: 187 10*3/uL (ref 150–400)
RBC: 4.91 MIL/uL (ref 3.87–5.11)
RDW: 14.3 % (ref 11.5–15.5)
WBC: 8.3 10*3/uL (ref 4.0–10.5)
nRBC: 0.2 % (ref 0.0–0.2)

## 2021-05-03 LAB — GLUCOSE, CAPILLARY
Glucose-Capillary: 105 mg/dL — ABNORMAL HIGH (ref 70–99)
Glucose-Capillary: 126 mg/dL — ABNORMAL HIGH (ref 70–99)
Glucose-Capillary: 224 mg/dL — ABNORMAL HIGH (ref 70–99)
Glucose-Capillary: 201 mg/dL — ABNORMAL HIGH (ref 70–99)
Glucose-Capillary: 254 mg/dL — ABNORMAL HIGH (ref 70–99)
Glucose-Capillary: 121 mg/dL — ABNORMAL HIGH (ref 70–99)

## 2021-05-03 MED ORDER — LACTULOSE 10 GM/15ML PO SOLN
10.0000 g | Freq: Every day | ORAL | Status: DC
  Administered 2021-05-04 – 2021-05-05 (×2): 10 g via ORAL
  Filled 2021-05-03 (×2): qty 30

## 2021-05-03 MED ORDER — INSULIN ASPART 100 UNIT/ML IJ SOLN
0.0000 [IU] | Freq: Three times a day (TID) | INTRAMUSCULAR | Status: DC
  Administered 2021-05-03 (×2): 3 [IU] via SUBCUTANEOUS
  Administered 2021-05-04: 2 [IU] via SUBCUTANEOUS
  Administered 2021-05-04 – 2021-05-05 (×2): 3 [IU] via SUBCUTANEOUS

## 2021-05-03 MED ORDER — POTASSIUM CHLORIDE 10 MEQ/100ML IV SOLN
10.0000 meq | INTRAVENOUS | Status: AC
  Administered 2021-05-03 (×6): 10 meq via INTRAVENOUS
  Filled 2021-05-03 (×6): qty 100

## 2021-05-03 MED ORDER — POTASSIUM CHLORIDE 20 MEQ PO PACK
40.0000 meq | PACK | ORAL | Status: DC
  Administered 2021-05-03: 40 meq via ORAL
  Filled 2021-05-03: qty 2

## 2021-05-03 MED ORDER — FUROSEMIDE 10 MG/ML IJ SOLN
40.0000 mg | Freq: Once | INTRAMUSCULAR | Status: AC
  Administered 2021-05-03: 40 mg via INTRAVENOUS
  Filled 2021-05-03: qty 4

## 2021-05-03 NOTE — Procedures (Addendum)
Patient Name: [name]  MRN: [id number]  Epilepsy Attending: Lora Havens  Referring Physician/Provider: Dr Kathrynn Speed Duration: 05/02/2021 2106 to 05/03/2021 2106   Patient history: 70 year old female with breakthrough seizure in the setting of severe electrolyte derangement.  EEG to evaluate for seizure.   Level of alertness:Awake, asleep   AEDs during EEG study: LEV, LCM   Technical aspects: This EEG study was done with scalp electrodes positioned according to the 10-20 International system of electrode placement. Electrical activity was acquired at a sampling rate of 500Hz and reviewed with a high frequency filter of 70Hz and a low frequency filter of 1Hz. EEG data were recorded continuously and digitally stored.    Description: The posterior dominant rhythm consists of 7 Hz activity of moderate voltage (25-35 uV) seen predominantly in posterior head regions, symmetric and reactive to eye opening and eye closing. Sleep was characterized by vertex waves, sleep spindles (12 to 14 Hz), maximal frontocentral region. EEG showed continuous generalized 5 to 7 Hz theta slowing as well as intermittent generalized 2 to 3 Hz delta slowing.  Hyperventilation and photic stimulation were not performed.     Parts of study were difficult to interpret due to significant electrode artifact.   ABNORMALITY - Continuous slow, generalized   IMPRESSION: This study is suggestive of moderate diffuse encephalopathy, nonspecific etiology. No seizures or epileptiform discharges were seen throughout the recording.   Paz Winsett [name]

## 2021-05-03 NOTE — Progress Notes (Signed)
CPAP held, Cont. EEG running at this time. Patient resting comfortably no compromise or irregular breathing pattern noted.

## 2021-05-03 NOTE — Progress Notes (Signed)
PROGRESS NOTE  [name] GNF:[id number] DOB: Apr 17, 1951   PCP: [facility]  Patient is from: Home  DOA: 05/01/2021 LOS: 2  Chief complaints:  No chief complaint on file.    Brief Narrative / Interim history: 70 year old F with PMH of COPD/RF on 2 L at night, chronic combined systolic and diastolic CHF, liver cirrhosis, DM-2, HTN, RA, depression, seizure disorder, CVA, chronic diarrhea and deafness presented to [facility] with increased shortness of breath, cough, wheeze, markedly elevated blood pressure, headache, dizziness and blurry vision.  Initially admitted for COPD exacerbation with uncontrolled hypertension.  Throughout her hospitalization at [facility] she was noted to have seizure-like episodes, at 1 point there was a concern for CVA as well.  Neurology consulted.  CT head and MRI brain without acute finding.  Carotid ultrasound with 50 to 69% bilateral ICA stenosis.  -She was transferred to [facility] for continuous EEG monitoring   Assessment & Plan:  Suspected pseudoseizure History seizure disorder -Concern at [facility] for possible pseudoseizures -Continued on home regimen of Keppra and Vimpat -Continuous EEG monitoring at this time, neurology following -MRI brain 10/23 was negative for acute findings, chronic white matter changes noted -PT OT eval today -Ammonia level also noted to be mildly elevated, started on lactulose, mental status improving  Acute on chronic hypoxic respiratory failure - COPD exacerbation -Treated for COPD exacerbation at [facility], continues to have productive cough, mild respiratory distress, no overt wheezes at this time -Continue prednisone, duo nebs diet -chest x-ray noted cardiomegaly and mild pulmonary vascular congestion -Continue Mucinex, add incentive spirometry  Acute on chronic combined CHF -Clinically appears slightly volume overloaded, last echo with EF of 50% -Diuresed with IV Lasix yesterday, 2 L negative, chest x-ray  yesterday noted mild pulmonary vascular congestion, Lasix IV 1 more dose today  Accelerated hypertension -BP running higher, started amlodipine, IV Lasix today  Type 2 diabetes mellitus Steroid-induced hyperglycemia -Glimepiride on hold, CBG stable, sensitive sliding scale  History of liver cirrhosis Mild hepatic encephalopathy -Improving on lactulose, decrease dose due to diarrhea  Hypokalemia -Replace  Hypophosphatemia, phos repleted.  Anxiety and depression -Continue home Celexa   Chronic diarrhea-previously evaluated for this by GI.  Colo showed polyps and nonbleeding internal hemorrhoid in 10/2020.  EGD the same time concerning for Barrett's esophagus, 2 cm hiatal hernia and gastric polyp.   Tobacco use disorder  Impaired hearing/deaf -Can communicate using in person sign language interpreter or by reading lips when she is alert      DVT prophylaxis: enoxaparin  Code Status: Full code Family Communication: No family at bedside, called and updated daughter yesterday Level of care: Progressive Status is: Inpatient  Subjective: -Feels better today, breathing improving, continues to have productive cough  Objective: Vitals:   05/02/21 2036 05/03/21 0058 05/03/21 0406 05/03/21 0801  BP: 131/63 (!) 157/56 (!) 162/65 (!) 160/61  Pulse: 68  (!) 53 69  Resp: (!) 24 (!) 24 (!) 23 14  Temp: 98.5 F (36.9 C) 97.8 F (36.6 C) 97.9 F (36.6 C) 97.9 F (36.6 C)  TempSrc: Oral Axillary  Oral  SpO2: 93% 97% 96% 90%    Intake/Output Summary (Last 24 hours) at 05/03/2021 1129 Last data filed at 05/02/2021 1500 Gross per 24 hour  Intake 10 ml  Output 750 ml  Net -740 ml   There were no vitals filed for this visit.   Examination:  Gen: Average built female sitting up in bed, much more awake alert and interactive today, communicated using  sign language interpreter at bedside HEENT: No JVD CVS: S1-S2, regular rate rhythm Lungs: Few scattered rhonchi, improving air  movement Abdomen: Soft, nontender, bowel sounds present Extremities: No edema  Skin: No rash on exposed skin Neuro: Moves all extremities, no localizing signs, flat affect, higher functioning difficult to assess  Procedures:  LTM  Consultants:  Neurology   Sch Meds:  Scheduled Meds:  amLODipine  10 mg Oral Daily   enoxaparin (LOVENOX) injection  40 mg Subcutaneous Q24H   fluticasone furoate-vilanterol  1 puff Inhalation Daily   lactulose  10 g Oral Daily   predniSONE  40 mg Oral Q breakfast   Continuous Infusions:  lacosamide (VIMPAT) IV 100 mg (05/03/21 1114)   levETIRAcetam 500 mg (05/03/21 1035)   potassium chloride      PRN Meds:.acetaminophen **OR** acetaminophen, albuterol, hydrALAZINE  Antimicrobials: Anti-infectives (From admission, onward)    None        I have personally reviewed the following labs and images: CBC: Recent Labs  Lab 04/27/21 1729 04/28/21 1001 04/29/21 0550 05/01/21 0321 05/01/21 2241 05/03/21 0341  WBC 7.6 3.7* 9.1 7.0 6.9 8.3  NEUTROABS 4.3  --   --   --   --   --   HGB 15.0 12.1 13.1 12.6 12.9 13.6  HCT 44.3 35.4* 38.0 38.5 40.3 40.9  MCV 82.6 82.9 83.9 83.5 84.5 83.3  PLT 258 186 173 187 177 187   BMP &GFR Recent Labs  Lab 04/28/21 1001 04/29/21 0550 04/30/21 0456 05/01/21 0321 05/01/21 2241 05/03/21 0341  NA 143 142 144 140 142 140  K 5.4* 2.8* 3.6 3.5 4.4 2.9*  CL 109 103 107 107 109 102  CO2 _0 GLUCOSE 246* 232* 143* 143* 87 99  BUN _1 CREATININE 0.49  0.46 0.50 0.64 0.57 0.63 0.66  CALCIUM 6.9* 8.3* 8.6* 8.2* 8.4* 8.8*  MG 1.5* 2.1 2.0 1.9 QUANTITY NOT SUFFICIENT, UNABLE TO PERFORM TEST  --   PHOS 2.1* 3.3 2.1* 3.1 3.2  --    Estimated Creatinine Clearance: 54.9 mL/min (by C-G formula based on SCr of 0.66 mg/dL). Liver & Pancreas: Recent Labs  Lab 04/27/21 1729 04/29/21 0550 05/01/21 2241  AST 24  --  32  ALT 13  --  23  ALKPHOS 85  --  65  BILITOT 0.6  --  QUANTITY  NOT SUFFICIENT, UNABLE TO PERFORM TEST  PROT 7.5  --  5.6*  ALBUMIN 3.9 3.0* 2.9*   No results for input(s): LIPASE, AMYLASE in the last 168 hours. Recent Labs  Lab 05/02/21 1232  AMMONIA 46*   Diabetic: No results for input(s): HGBA1C in the last 72 hours.  Recent Labs  Lab 05/02/21 1607 05/02/21 2020 05/02/21 2313 05/03/21 0329 05/03/21 0814  GLUCAP 268* 258* 125* 105* 126*   Cardiac Enzymes: No results for input(s): CKTOTAL, CKMB, CKMBINDEX, TROPONINI in the last 168 hours. No results for input(s): PROBNP in the last 8760 hours. Coagulation Profile: No results for input(s): INR, PROTIME in the last 168 hours. Thyroid Function Tests: No results for input(s): TSH, T4TOTAL, FREET4, T3FREE, THYROIDAB in the last 72 hours. Lipid Profile: No results for input(s): CHOL, HDL, LDLCALC, TRIG, CHOLHDL, LDLDIRECT in the last 72 hours.  Anemia Panel: No results for input(s): VITAMINB12, FOLATE, FERRITIN, TIBC, IRON, RETICCTPCT in the last 72 hours. Urine analysis:    Component Value Date/Time   COLORURINE YELLOW (A) 07/29/2020 2146   APPEARANCEUR HAZY (  A) 07/29/2020 2146   APPEARANCEUR Clear 11/14/2011 1607   LABSPEC 1.018 07/29/2020 2146   LABSPEC 1.006 11/14/2011 1607   PHURINE 5.0 07/29/2020 2146   GLUCOSEU NEGATIVE 07/29/2020 2146   GLUCOSEU Negative 11/14/2011 [place] 07/29/2020 2146   BILIRUBINUR NEGATIVE 07/29/2020 2146   BILIRUBINUR Negative 11/14/2011 [place] 07/29/2020 2146   PROTEINUR NEGATIVE 07/29/2020 2146   NITRITE NEGATIVE 07/29/2020 2146   LEUKOCYTESUR NEGATIVE 07/29/2020 2146   LEUKOCYTESUR Negative 11/14/2011 1607   Sepsis Labs: Invalid input(s): PROCALCITONIN, [place]  Microbiology: Recent Results (from the past 240 hour(s))  Resp Panel by RT-PCR (Flu A&B, Covid) Nasopharyngeal Swab     Status: None   Collection Time: 04/27/21 11:33 PM   Specimen: Nasopharyngeal Swab; Nasopharyngeal(NP) swabs in vial transport  medium  Result Value Ref Range Status   SARS Coronavirus 2 by RT PCR NEGATIVE NEGATIVE Final    Comment: (NOTE) SARS-CoV-2 target nucleic acids are NOT DETECTED.  The SARS-CoV-2 RNA is generally detectable in upper respiratory specimens during the acute phase of infection. The lowest concentration of SARS-CoV-2 viral copies this assay can detect is 138 copies/mL. A negative result does not preclude SARS-Cov-2 infection and should not be used as the sole basis for treatment or other patient management decisions. A negative result may occur with  improper specimen collection/handling, submission of specimen other than nasopharyngeal swab, presence of viral mutation(s) within the areas targeted by this assay, and inadequate number of viral copies(<138 copies/mL). A negative result must be combined with clinical observations, patient history, and epidemiological information. The expected result is Negative.  Fact Sheet for Patients:  EntrepreneurPulse.com.au  Fact Sheet for Healthcare Providers:  IncredibleEmployment.be  This test is no t yet approved or cleared by the [place] FDA and  has been authorized for detection and/or diagnosis of SARS-CoV-2 by FDA under an Emergency Use Authorization (EUA). This EUA will remain  in effect (meaning this test can be used) for the duration of the COVID-19 declaration under Section 564(b)(1) of the Act, 21 U.S.C.section 360bbb-3(b)(1), unless the authorization is terminated  or revoked sooner.       Influenza A by PCR NEGATIVE NEGATIVE Final   Influenza B by PCR NEGATIVE NEGATIVE Final    Comment: (NOTE) The Xpert Xpress SARS-CoV-2/FLU/RSV plus assay is intended as an aid in the diagnosis of influenza from Nasopharyngeal swab specimens and should not be used as a sole basis for treatment. Nasal washings and aspirates are unacceptable for Xpert Xpress SARS-CoV-2/FLU/RSV testing.  Fact Sheet for  Patients: EntrepreneurPulse.com.au  Fact Sheet for Healthcare Providers: IncredibleEmployment.be  This test is not yet approved or cleared by the [place] FDA and has been authorized for detection and/or diagnosis of SARS-CoV-2 by FDA under an Emergency Use Authorization (EUA). This EUA will remain in effect (meaning this test can be used) for the duration of the COVID-19 declaration under Section 564(b)(1) of the Act, 21 U.S.C. section 360bbb-3(b)(1), unless the authorization is terminated or revoked.  Performed at [facility], [address], [place], [place] [address]     Radiology Studies: No results found.  Domenic Polite, MD [facility]  05/03/2021, 11:29 AM

## 2021-05-03 NOTE — Progress Notes (Signed)
EEG maintenance performed.  No skin breakdown observed at electrode sites Fp1, Fp2.

## 2021-05-03 NOTE — Progress Notes (Signed)
0500 received call about possible event. Pt assessed. Pt is deaf, ASL translating services used. Pt is alert oriented x 3, pt states the year was 202, did not finish year but she stated she was having a coughing spell and felt fine.

## 2021-05-03 NOTE — Plan of Care (Signed)
  Problem: Education: Goal: Expressions of having a comfortable level of knowledge regarding the disease process will increase Outcome: Progressing   Problem: Safety: Goal: Verbalization of understanding the information provided will improve Outcome: Progressing   Problem: Clinical Measurements: Goal: Ability to maintain clinical measurements within normal limits will improve Outcome: Progressing Goal: Will remain free from infection Outcome: Progressing Goal: Diagnostic test results will improve Outcome: Progressing Goal: Respiratory complications will improve Outcome: Progressing Goal: Cardiovascular complication will be avoided Outcome: Progressing

## 2021-05-03 NOTE — Care Management Important Message (Signed)
Important Message  Patient Details  Name: [name] MRN: [id number] Date of Birth: [birth year]   Medicare Important Message Given:        [name] 05/03/2021, 3:43 PM

## 2021-05-03 NOTE — Progress Notes (Signed)
Neurology Progress Note  S: Patient is reading lips today and smiling. She is much more alert today and participates in exam. When asked if she if feeling better, she gives NP a thumbs up. Asking to get EEG off. Has been getting up to bedside commode and has no purewick now.   O: Current vital signs: BP (!) 160/61 (BP Location: Left Arm)   Pulse 69   Temp 97.9 F (36.6 C) (Oral)   Resp 14   SpO2 90%  Vital signs in last 24 hours: Temp:  [97.8 F (36.6 C)-98.7 F (37.1 C)] 97.9 F (36.6 C) (10/28 0801) Pulse Rate:  [53-88] 69 (10/28 0801) Resp:  [14-34] 14 (10/28 0801) BP: (131-169)/(51-73) 160/61 (10/28 0801) SpO2:  [90 %-97 %] 90 % (10/28 0801)  GENERAL: Well appearing elderly female. Awake and alert in NAD.  HEENT: Normocephalic and atraumatic. EEG leads in place.  LUNGS: Normal respiratory effort.  CV: RRR on tele.  Ext: warm.  NEURO:  Mental Status: Alert-much improved over yesterday. Is conversant today and participating in exam even though she is deaf.   Speech/Language: She can mouth words and her words are appropriate. Comprehension intact.  Cranial Nerves:  PERRL. Eyelids elevate symmetrically. Smile is symmetrical. Tracks NP. Deaf, but reading lips and mouthing words today. Strength is normal.  Medications  Current Facility-Administered Medications:    acetaminophen (TYLENOL) tablet 650 mg, 650 mg, Oral, Q6H PRN **OR** acetaminophen (TYLENOL) suppository 650 mg, 650 mg, Rectal, Q6H PRN, Shela Leff, MD   albuterol (PROVENTIL) (2.5 MG/3ML) 0.083% nebulizer solution 2.5 mg, 2.5 mg, Nebulization, Q4H PRN, Shela Leff, MD   amLODipine (NORVASC) tablet 10 mg, 10 mg, Oral, Daily, Domenic Polite, MD, 10 mg at 05/03/21 1034   enoxaparin (LOVENOX) injection 40 mg, 40 mg, Subcutaneous, Q24H, Shela Leff, MD, 40 mg at 05/02/21 2200   fluticasone furoate-vilanterol (BREO ELLIPTA) 100-25 MCG/ACT 1 puff, 1 puff, Inhalation, Daily, Shela Leff, MD, 1  puff at 05/03/21 0745   hydrALAZINE (APRESOLINE) injection 5 mg, 5 mg, Intravenous, Q4H PRN, Domenic Polite, MD   lacosamide (VIMPAT) 100 mg in sodium chloride 0.9 % 25 mL IVPB, 100 mg, Intravenous, Q12H, Shela Leff, MD, Last Rate: 70 mL/hr at 05/02/21 2208, 100 mg at 05/02/21 2208   lactulose (CHRONULAC) 10 GM/15ML solution 10 g, 10 g, Oral, Daily, Domenic Polite, MD   levETIRAcetam (KEPPRA) IVPB 500 mg/100 mL premix, 500 mg, Intravenous, Q12H, Shela Leff, MD, Last Rate: 400 mL/hr at 05/02/21 2240, 500 mg at 05/02/21 2240   potassium chloride 10 mEq in 100 mL IVPB, 10 mEq, Intravenous, Q1 Hr x 6, Domenic Polite, MD   predniSONE (DELTASONE) tablet 40 mg, 40 mg, Oral, Q breakfast, Shela Leff, MD, 40 mg at 05/03/21 0848  Pertinent Labs K 2.9.   EEG 05/02/21 read by Dr. Hortense Ramal: "This study is suggestive of moderate diffuse encephalopathy, nonspecific etiology. No seizures or epileptiform discharges were seen throughout the recording."  Assessment: 70 yo female who presented from [facility] for cEEG in face of seizure like activity there. Her EEG after 36 hours has still not revealed a seizure episode. NP touched base with Dr. Hortense Ramal about cEEG and she advised to leave it on one more day since patient is not ready for discharge. She is much more awake today and getting OOB.   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   Recommendations/Plan:  -Continue cEEG until am's reading.  -Continue Vimpat and Keppra and we will let out patient neurology decide on stopping  Keppra (wasn't on at admission).  -f/up with her neurologist 4 weeks after discharge.  -Continue to correct metabolic derangements as you are doing.   Pt seen by Clance Boll, MSN, APN-BC/Nurse Practitioner/Neuro and later by MD. Note and plan to be edited as needed by MD.  Pager: [phone]  NEUROHOSPITALIST ADDENDUM Performed a face to face diagnostic evaluation.   I have reviewed the contents of history and  physical exam as documented by PA/ARNP/Resident and agree with above documentation.  I have discussed and formulated the above plan as documented. Edits to the note have been made as needed.  Significantly improved mentation. No events captured. Will plan to take her off cEEG shortly before planned discharge date/time. Will reach out to hospitalist team to send [place] a message when patient is ready for discharge.  Donnetta Simpers, MD [facility] [phone]   If 7pm to 7am, please call on call as listed on AMION.

## 2021-05-04 DIAGNOSIS — R569 Unspecified convulsions: Secondary | ICD-10-CM | POA: Diagnosis not present

## 2021-05-04 DIAGNOSIS — J441 Chronic obstructive pulmonary disease with (acute) exacerbation: Secondary | ICD-10-CM | POA: Diagnosis not present

## 2021-05-04 LAB — CBC
HCT: 41.2 % (ref 36.0–46.0)
Hemoglobin: 13.4 g/dL (ref 12.0–15.0)
MCH: 27.3 pg (ref 26.0–34.0)
MCHC: 32.5 g/dL (ref 30.0–36.0)
MCV: 84.1 fL (ref 80.0–100.0)
Platelets: 195 10*3/uL (ref 150–400)
RBC: 4.9 MIL/uL (ref 3.87–5.11)
RDW: 14.2 % (ref 11.5–15.5)
WBC: 8.4 10*3/uL (ref 4.0–10.5)
nRBC: 0 % (ref 0.0–0.2)

## 2021-05-04 LAB — BASIC METABOLIC PANEL
Anion gap: 10 (ref 5–15)
BUN: 13 mg/dL (ref 8–23)
CO2: 28 mmol/L (ref 22–32)
Calcium: 9 mg/dL (ref 8.9–10.3)
Chloride: 104 mmol/L (ref 98–111)
Creatinine, Ser: 0.72 mg/dL (ref 0.44–1.00)
GFR, Estimated: >60 mL/min (ref >60)
Glucose, Bld: 95 mg/dL (ref 70–99)
Potassium: 4 mmol/L (ref 3.5–5.1)
Sodium: 142 mmol/L (ref 135–145)

## 2021-05-04 LAB — GLUCOSE, CAPILLARY
Glucose-Capillary: 100 mg/dL — ABNORMAL HIGH (ref 70–99)
Glucose-Capillary: 191 mg/dL — ABNORMAL HIGH (ref 70–99)
Glucose-Capillary: 237 mg/dL — ABNORMAL HIGH (ref 70–99)
Glucose-Capillary: 210 mg/dL — ABNORMAL HIGH (ref 70–99)

## 2021-05-04 MED ORDER — LEVETIRACETAM 500 MG PO TABS
500.0000 mg | ORAL_TABLET | Freq: Two times a day (BID) | ORAL | Status: DC
  Administered 2021-05-04 – 2021-05-05 (×2): 500 mg via ORAL
  Filled 2021-05-04 (×2): qty 1

## 2021-05-04 MED ORDER — MELATONIN 3 MG PO TABS
3.0000 mg | ORAL_TABLET | Freq: Every day | ORAL | Status: DC
  Administered 2021-05-04: 3 mg via ORAL
  Filled 2021-05-04: qty 1

## 2021-05-04 MED ORDER — PREDNISONE 5 MG PO TABS
30.0000 mg | ORAL_TABLET | Freq: Every day | ORAL | Status: DC
  Administered 2021-05-04 – 2021-05-05 (×2): 30 mg via ORAL
  Filled 2021-05-04 (×2): qty 2

## 2021-05-04 MED ORDER — PREDNISONE 5 MG PO TABS: 30.0000 mg | ORAL_TABLET | Freq: Every day | ORAL | Status: DC

## 2021-05-04 MED ORDER — POTASSIUM CHLORIDE CRYS ER 20 MEQ PO TBCR
20.0000 meq | EXTENDED_RELEASE_TABLET | Freq: Every day | ORAL | Status: DC
  Administered 2021-05-04 – 2021-05-05 (×2): 20 meq via ORAL
  Filled 2021-05-04 (×2): qty 1

## 2021-05-04 MED ORDER — FUROSEMIDE 40 MG PO TABS
40.0000 mg | ORAL_TABLET | Freq: Every day | ORAL | Status: DC
  Administered 2021-05-04 – 2021-05-05 (×2): 40 mg via ORAL
  Filled 2021-05-04 (×2): qty 1

## 2021-05-04 MED ORDER — LACOSAMIDE 50 MG PO TABS
100.0000 mg | ORAL_TABLET | Freq: Two times a day (BID) | ORAL | Status: DC
  Administered 2021-05-04 – 2021-05-05 (×2): 100 mg via ORAL
  Filled 2021-05-04 (×2): qty 2

## 2021-05-04 NOTE — Progress Notes (Signed)
PROGRESS NOTE  [name] VBT:[id number] DOB: 10-05-1950   PCP: [facility]  Patient is from: Home  DOA: 05/01/2021 LOS: 3  Chief complaints:  No chief complaint on file.    Brief Narrative / Interim history: 70 year old F with PMH of COPD/RF on 2 L at night, chronic combined systolic and diastolic CHF, liver cirrhosis, DM-2, HTN, RA, depression, seizure disorder, CVA, chronic diarrhea and deafness presented to [facility] with increased shortness of breath, cough, wheeze, markedly elevated blood pressure, headache, dizziness and blurry vision.  Initially admitted for COPD exacerbation with uncontrolled hypertension.  Throughout her hospitalization at [facility] she was noted to have seizure-like episodes, at 1 point there was a concern for CVA as well.  Neurology consulted.  CT head and MRI brain without acute finding.  Carotid ultrasound with 50 to 69% bilateral ICA stenosis.  -She was transferred to [facility] for continuous EEG monitoring   Assessment & Plan:  Suspected pseudoseizure History seizure disorder -Concern at [facility] for possible pseudoseizures -Continued on home regimen of Keppra and Vimpat -Neurology following, on continuous EEG -MRI brain 10/23 was negative for acute findings, chronic white matter changes noted -PT OT eval today -Ammonia level also noted to be mildly elevated, started on lactulose, mental status improving -Ambulate, out of bed, PT OT eval -Discharge planning, home in 1 to 2 days  Acute on chronic hypoxic respiratory failure - COPD exacerbation -Treated for COPD exacerbation at [facility], continues to have productive cough, no further wheezing noted today, volume status improving as well -Taper prednisone, continue duo nebs -chest x-ray noted cardiomegaly and mild pulmonary vascular congestion -Continue Mucinex, add incentive spirometry -Wean O2 down to 2 L  Acute on chronic combined CHF -Was volume overloaded initially, last echo with EF of  50% -Diuresed with IV initially including yesterday -Transition to oral diuretics today  Accelerated hypertension -BP running higher, started amlodipine, diuresed with IV Lasix as well -Start p.o. Lasix  Type 2 diabetes mellitus Steroid-induced hyperglycemia -Glimepiride on hold, CBG stable, sensitive sliding scale  History of liver cirrhosis Mild hepatic encephalopathy -Improving on lactulose, decreased dose due to diarrhea  Hypokalemia -Replace  Hypophosphatemia, phos repleted.  Anxiety and depression -Continue home Celexa   Chronic diarrhea-previously evaluated for this by GI.  Colo showed polyps and nonbleeding internal hemorrhoid in 10/2020.  EGD the same time concerning for Barrett's esophagus, 2 cm hiatal hernia and gastric polyp.   Tobacco use disorder  Impaired hearing/deaf -Can communicate using in person sign language interpreter or by reading lips when she is alert      DVT prophylaxis: enoxaparin  Code Status: Full code Family Communication: No family at bedside, updated daughter 10/28 Level of care: Progressive Status is: Inpatient  Subjective: -Feels better, breathing improving, continues to have productive cough  Objective: Vitals:   05/03/21 2336 05/04/21 0351 05/04/21 0808 05/04/21 0816  BP: (!) 151/62 (!) 155/54  (!) 160/64  Pulse: 61 (!) 53  61  Resp: (!) _0 Temp: 97.6 F (36.4 C) (!) 97.3 F (36.3 C)  98 F (36.7 C)  TempSrc: Oral Oral  Oral  SpO2: 99% 99% 90% 93%    Intake/Output Summary (Last 24 hours) at 05/04/2021 1036 Last data filed at 05/04/2021 0000 Gross per 24 hour  Intake 617.6 ml  Output --  Net 617.6 ml   There were no vitals filed for this visit.   Examination:  Gen: Pleasant average built female sitting up in bed, awake alert, oriented  to self and place, communicated using sign language interpreter at bedside HEENT: No JVD CVS: S1-S2, regular rate rhythm Lungs: Few scattered rhonchi, improved air  movement Abdomen: Soft, nontender, bowel sounds present Extremities: No edema  Skin: No rash on exposed skin Neuro: Moves all extremities, no localizing signs, flat affect, higher functioning difficult to assess  Procedures:  LTM  Consultants:  Neurology   Sch Meds:  Scheduled Meds:  amLODipine  10 mg Oral Daily   enoxaparin (LOVENOX) injection  40 mg Subcutaneous Q24H   fluticasone furoate-vilanterol  1 puff Inhalation Daily   insulin aspart  0-9 Units Subcutaneous TID WC   lactulose  10 g Oral Daily   melatonin  3 mg Oral QHS   predniSONE  30 mg Oral Q breakfast   Continuous Infusions:  lacosamide (VIMPAT) IV 100 mg (05/04/21 1013)   levETIRAcetam 500 mg (05/04/21 0857)    PRN Meds:.acetaminophen **OR** acetaminophen, albuterol, hydrALAZINE  Antimicrobials: Anti-infectives (From admission, onward)    None      CBC: Recent Labs  Lab 04/27/21 1729 04/28/21 1001 04/29/21 0550 05/01/21 0321 05/01/21 2241 05/03/21 0341 05/04/21 0628  WBC 7.6   < > 9.1 7.0 6.9 8.3 8.4  NEUTROABS 4.3  --   --   --   --   --   --   HGB 15.0   < > 13.1 12.6 12.9 13.6 13.4  HCT 44.3   < > 38.0 38.5 40.3 40.9 41.2  MCV 82.6   < > 83.9 83.5 84.5 83.3 84.1  PLT 258   < > 173 187 177 187 195   < > = values in this interval not displayed.   BMP &GFR Recent Labs  Lab 04/28/21 1001 04/29/21 0550 04/30/21 0456 05/01/21 0321 05/01/21 2241 05/03/21 0341 05/04/21 0628  NA 143 142 144 140 142 140 142  K 5.4* 2.8* 3.6 3.5 4.4 2.9* 4.0  CL 109 103 107 107 109 102 104  CO2 _0 GLUCOSE 246* 232* 143* 143* 87 99 95  BUN _1 CREATININE 0.49  0.46 0.50 0.64 0.57 0.63 0.66 0.72  CALCIUM 6.9* 8.3* 8.6* 8.2* 8.4* 8.8* 9.0  MG 1.5* 2.1 2.0 1.9 QUANTITY NOT SUFFICIENT, UNABLE TO PERFORM TEST  --   --   PHOS 2.1* 3.3 2.1* 3.1 3.2  --   --    Estimated Creatinine Clearance: 54.9 mL/min (by C-G formula based on SCr of 0.72 mg/dL). Liver & Pancreas: Recent  Labs  Lab 04/27/21 1729 04/29/21 0550 05/01/21 2241  AST 24  --  32  ALT 13  --  23  ALKPHOS 85  --  65  BILITOT 0.6  --  QUANTITY NOT SUFFICIENT, UNABLE TO PERFORM TEST  PROT 7.5  --  5.6*  ALBUMIN 3.9 3.0* 2.9*   No results for input(s): LIPASE, AMYLASE in the last 168 hours. Recent Labs  Lab 05/02/21 1232  AMMONIA 46*   Diabetic: No results for input(s): HGBA1C in the last 72 hours.  Recent Labs  Lab 05/03/21 1250 05/03/21 1546 05/03/21 1934 05/03/21 2214 05/04/21 0603  GLUCAP 224* 201* 254* 121* 100*   Cardiac Enzymes: No results for input(s): CKTOTAL, CKMB, CKMBINDEX, TROPONINI in the last 168 hours. No results for input(s): PROBNP in the last 8760 hours. Coagulation Profile: No results for input(s): INR, PROTIME in the last 168 hours. Thyroid Function Tests: No results for input(s): TSH, T4TOTAL, FREET4, T3FREE, THYROIDAB  in the last 72 hours. Lipid Profile: No results for input(s): CHOL, HDL, LDLCALC, TRIG, CHOLHDL, LDLDIRECT in the last 72 hours.  Anemia Panel: No results for input(s): VITAMINB12, FOLATE, FERRITIN, TIBC, IRON, RETICCTPCT in the last 72 hours. Urine analysis:    Component Value Date/Time   COLORURINE YELLOW (A) 07/29/2020 2146   APPEARANCEUR HAZY (A) 07/29/2020 2146   APPEARANCEUR Clear 11/14/2011 1607   LABSPEC 1.018 07/29/2020 2146   LABSPEC 1.006 11/14/2011 1607   PHURINE 5.0 07/29/2020 2146   GLUCOSEU NEGATIVE 07/29/2020 2146   GLUCOSEU Negative 11/14/2011 1607   HGBUR NEGATIVE 07/29/2020 2146   BILIRUBINUR NEGATIVE 07/29/2020 2146   BILIRUBINUR Negative 11/14/2011 1607   KETONESUR NEGATIVE 07/29/2020 2146   PROTEINUR NEGATIVE 07/29/2020 2146   NITRITE NEGATIVE 07/29/2020 2146   LEUKOCYTESUR NEGATIVE 07/29/2020 2146   LEUKOCYTESUR Negative 11/14/2011 1607   Sepsis Labs: Invalid input(s): PROCALCITONIN, [place]  Microbiology: Recent Results (from the past 240 hour(s))  Resp Panel by RT-PCR (Flu A&B, Covid)  Nasopharyngeal Swab     Status: None   Collection Time: 04/27/21 11:33 PM   Specimen: Nasopharyngeal Swab; Nasopharyngeal(NP) swabs in vial transport medium  Result Value Ref Range Status   SARS Coronavirus 2 by RT PCR NEGATIVE NEGATIVE Final    Comment: (NOTE) SARS-CoV-2 target nucleic acids are NOT DETECTED.  The SARS-CoV-2 RNA is generally detectable in upper respiratory specimens during the acute phase of infection. The lowest concentration of SARS-CoV-2 viral copies this assay can detect is 138 copies/mL. A negative result does not preclude SARS-Cov-2 infection and should not be used as the sole basis for treatment or other patient management decisions. A negative result may occur with  improper specimen collection/handling, submission of specimen other than nasopharyngeal swab, presence of viral mutation(s) within the areas targeted by this assay, and inadequate number of viral copies(<138 copies/mL). A negative result must be combined with clinical observations, patient history, and epidemiological information. The expected result is Negative.  Fact Sheet for Patients:  EntrepreneurPulse.com.au  Fact Sheet for Healthcare Providers:  IncredibleEmployment.be  This test is no t yet approved or cleared by the [place] FDA and  has been authorized for detection and/or diagnosis of SARS-CoV-2 by FDA under an Emergency Use Authorization (EUA). This EUA will remain  in effect (meaning this test can be used) for the duration of the COVID-19 declaration under Section 564(b)(1) of the Act, 21 U.S.C.section 360bbb-3(b)(1), unless the authorization is terminated  or revoked sooner.       Influenza A by PCR NEGATIVE NEGATIVE Final   Influenza B by PCR NEGATIVE NEGATIVE Final    Comment: (NOTE) The Xpert Xpress SARS-CoV-2/FLU/RSV plus assay is intended as an aid in the diagnosis of influenza from Nasopharyngeal swab specimens and should not be  used as a sole basis for treatment. Nasal washings and aspirates are unacceptable for Xpert Xpress SARS-CoV-2/FLU/RSV testing.  Fact Sheet for Patients: EntrepreneurPulse.com.au  Fact Sheet for Healthcare Providers: IncredibleEmployment.be  This test is not yet approved or cleared by the [place] FDA and has been authorized for detection and/or diagnosis of SARS-CoV-2 by FDA under an Emergency Use Authorization (EUA). This EUA will remain in effect (meaning this test can be used) for the duration of the COVID-19 declaration under Section 564(b)(1) of the Act, 21 U.S.C. section 360bbb-3(b)(1), unless the authorization is terminated or revoked.  Performed at [facility], [address], [place], [place] [address]     Radiology Studies: No results found.  Domenic Polite, MD  [facility]  05/04/2021, 10:36 AM

## 2021-05-04 NOTE — Progress Notes (Signed)
LTM checked. Impedance = good . No skin breakdown noted. Results pending.

## 2021-05-04 NOTE — Procedures (Addendum)
Patient Name: [name]  MRN: [id number]  Epilepsy Attending: Lora Havens  Referring Physician/Provider: Dr Kathrynn Speed Duration: 05/03/2021 2106 to 05/04/2021 2106   Patient history: 70 year old female with breakthrough seizure in the setting of severe electrolyte derangement.  EEG to evaluate for seizure.   Level of alertness:Awake, asleep   AEDs during EEG study: LEV, LCM   Technical aspects: This EEG study was done with scalp electrodes positioned according to the 10-20 International system of electrode placement. Electrical activity was acquired at a sampling rate of 500Hz and reviewed with a high frequency filter of 70Hz and a low frequency filter of 1Hz. EEG data were recorded continuously and digitally stored.    Description: The posterior dominant rhythm consists of 7.5 Hz activity of moderate voltage (25-35 uV) seen predominantly in posterior head regions, symmetric and reactive to eye opening and eye closing. Sleep was characterized by vertex waves, sleep spindles (12 to 14 Hz), maximal frontocentral region. EEG showed intermittent generalized 3-6hz theta-delta slowing, Hyperventilation and photic stimulation were not performed.     ABNORMALITY - Intermittent slow, generalized   IMPRESSION: This study is suggestive of mild diffuse encephalopathy, nonspecific etiology. No seizures or epileptiform discharges were seen throughout the recording.   Donye Campanelli [name]

## 2021-05-05 DIAGNOSIS — R569 Unspecified convulsions: Secondary | ICD-10-CM | POA: Diagnosis not present

## 2021-05-05 DIAGNOSIS — J441 Chronic obstructive pulmonary disease with (acute) exacerbation: Secondary | ICD-10-CM | POA: Diagnosis not present

## 2021-05-05 LAB — CBC
HCT: 42.7 % (ref 36.0–46.0)
Hemoglobin: 13.7 g/dL (ref 12.0–15.0)
MCH: 27 pg (ref 26.0–34.0)
MCHC: 32.1 g/dL (ref 30.0–36.0)
MCV: 84.2 fL (ref 80.0–100.0)
Platelets: 217 10*3/uL (ref 150–400)
RBC: 5.07 MIL/uL (ref 3.87–5.11)
RDW: 14.2 % (ref 11.5–15.5)
WBC: 8.5 10*3/uL (ref 4.0–10.5)
nRBC: 0 % (ref 0.0–0.2)

## 2021-05-05 LAB — BASIC METABOLIC PANEL
Anion gap: 9 (ref 5–15)
BUN: 15 mg/dL (ref 8–23)
CO2: 29 mmol/L (ref 22–32)
Calcium: 8.9 mg/dL (ref 8.9–10.3)
Chloride: 102 mmol/L (ref 98–111)
Creatinine, Ser: 0.75 mg/dL (ref 0.44–1.00)
GFR, Estimated: >60 mL/min (ref >60)
Glucose, Bld: 101 mg/dL — ABNORMAL HIGH (ref 70–99)
Potassium: 3.5 mmol/L (ref 3.5–5.1)
Sodium: 140 mmol/L (ref 135–145)

## 2021-05-05 LAB — GLUCOSE, CAPILLARY
Glucose-Capillary: 109 mg/dL — ABNORMAL HIGH (ref 70–99)
Glucose-Capillary: 119 mg/dL — ABNORMAL HIGH (ref 70–99)
Glucose-Capillary: 250 mg/dL — ABNORMAL HIGH (ref 70–99)

## 2021-05-05 MED ORDER — PREDNISONE 20 MG PO TABS: 20.0000 mg | ORAL_TABLET | Freq: Every day | ORAL | 0 refills | Status: AC

## 2021-05-05 MED ORDER — LACTULOSE 10 GM/15ML PO SOLN: 10.0000 g | Freq: Every day | ORAL | 1 refills | Status: DC

## 2021-05-05 NOTE — Plan of Care (Signed)
  Problem: Education: Goal: Expressions of having a comfortable level of knowledge regarding the disease process will increase 05/05/2021 1644 by Delia Chimes, RN Outcome: Adequate for Discharge 05/05/2021 1211 by Delia Chimes, RN Outcome: Progressing   Problem: Coping: Goal: Ability to adjust to condition or change in health will improve 05/05/2021 1644 by Delia Chimes, RN Outcome: Adequate for Discharge 05/05/2021 1211 by Delia Chimes, RN Outcome: Progressing Goal: Ability to identify appropriate support needs will improve 05/05/2021 1644 by Delia Chimes, RN Outcome: Adequate for Discharge 05/05/2021 1211 by Delia Chimes, RN Outcome: Progressing   Problem: Health Behavior/Discharge Planning: Goal: Compliance with prescribed medication regimen will improve 05/05/2021 1644 by Delia Chimes, RN Outcome: Adequate for Discharge 05/05/2021 1211 by Delia Chimes, RN Outcome: Progressing   Problem: Safety: Goal: Verbalization of understanding the information provided will improve 05/05/2021 1644 by Delia Chimes, RN Outcome: Adequate for Discharge 05/05/2021 1211 by Delia Chimes, RN Outcome: Progressing   Problem: Self-Concept: Goal: Level of anxiety will decrease 05/05/2021 1644 by Delia Chimes, RN Outcome: Adequate for Discharge 05/05/2021 1211 by Delia Chimes, RN Outcome: Progressing Goal: Ability to verbalize feelings about condition will improve 05/05/2021 1644 by Delia Chimes, RN Outcome: Adequate for Discharge 05/05/2021 1211 by Delia Chimes, RN Outcome: Progressing   Problem: Coping: Goal: Level of anxiety will decrease 05/05/2021 1644 by Delia Chimes, RN Outcome: Adequate for Discharge 05/05/2021 1211 by Delia Chimes, RN Outcome: Progressing   Problem: Elimination: Goal: Will not experience complications related to bowel motility 05/05/2021 1644 by Delia Chimes, RN Outcome: Adequate for  Discharge 05/05/2021 1211 by Delia Chimes, RN Outcome: Progressing Goal: Will not experience complications related to urinary retention 05/05/2021 1644 by Delia Chimes, RN Outcome: Adequate for Discharge 05/05/2021 1211 by Delia Chimes, RN Outcome: Progressing   Problem: Pain Managment: Goal: General experience of comfort will improve 05/05/2021 1644 by Delia Chimes, RN Outcome: Adequate for Discharge 05/05/2021 1211 by Delia Chimes, RN Outcome: Progressing

## 2021-05-05 NOTE — Procedures (Addendum)
Patient Name: [name]  MRN: [id number]  Epilepsy Attending: Lora Havens  Referring Physician/Provider: Dr Kathrynn Speed Duration: 05/04/2021 2106 to 05/05/2021  1457   Patient history: 70 year old female with breakthrough seizure in the setting of severe electrolyte derangement.  EEG to evaluate for seizure.   Level of alertness:Awake, asleep   AEDs during EEG study: LEV, LCM   Technical aspects: This EEG study was done with scalp electrodes positioned according to the 10-20 International system of electrode placement. Electrical activity was acquired at a sampling rate of 500Hz and reviewed with a high frequency filter of 70Hz and a low frequency filter of 1Hz. EEG data were recorded continuously and digitally stored.    Description: The posterior dominant rhythm consists of 7.5 Hz activity of moderate voltage (25-35 uV) seen predominantly in posterior head regions, symmetric and reactive to eye opening and eye closing. Sleep was characterized by vertex waves, sleep spindles (12 to 14 Hz), maximal frontocentral region. Hyperventilation and photic stimulation were not performed.     IMPRESSION: This study is within normal limits. No seizures or epileptiform discharges were seen throughout the recording.   [name] [name]

## 2021-05-05 NOTE — Progress Notes (Signed)
LTM off, okay to discharge home. Neurology will signoff.  Please feel free to contact us with any questions or concerns.  [place] Pager Number [phone]

## 2021-05-05 NOTE — Progress Notes (Signed)
LTM checked. Impedance= CZ, F3, and Fz fixed. No skin breakdown noted. Results pending.

## 2021-05-05 NOTE — Plan of Care (Signed)
  Problem: Education: Goal: Expressions of having a comfortable level of knowledge regarding the disease process will increase Outcome: Progressing   Problem: Coping: Goal: Ability to adjust to condition or change in health will improve Outcome: Progressing Goal: Ability to identify appropriate support needs will improve Outcome: Progressing   Problem: Health Behavior/Discharge Planning: Goal: Compliance with prescribed medication regimen will improve Outcome: Progressing   Problem: Medication: Goal: Risk for medication side effects will decrease Outcome: Progressing   Problem: Clinical Measurements: Goal: Complications related to the disease process, condition or treatment will be avoided or minimized Outcome: Progressing Goal: Diagnostic test results will improve Outcome: Progressing   Problem: Clinical Measurements: Goal: Ability to maintain clinical measurements within normal limits will improve Outcome: Progressing Goal: Will remain free from infection Outcome: Progressing Goal: Diagnostic test results will improve Outcome: Progressing Goal: Respiratory complications will improve Outcome: Progressing Goal: Cardiovascular complication will be avoided Outcome: Progressing   Problem: Activity: Goal: Risk for activity intolerance will decrease Outcome: Progressing   Problem: Nutrition: Goal: Adequate nutrition will be maintained Outcome: Progressing   Problem: Safety: Goal: Ability to remain free from injury will improve Outcome: Progressing   Problem: Skin Integrity: Goal: Risk for impaired skin integrity will decrease Outcome: Progressing

## 2021-05-05 NOTE — Progress Notes (Signed)
PROGRESS NOTE  [name] XNA:[id number] DOB: 11-24-1950   PCP: [facility]  Patient is from: Home  DOA: 05/01/2021 LOS: 4  Chief complaints:  No chief complaint on file.    Brief Narrative / Interim history: 70 year old F with PMH of COPD/RF on 2 L at night, chronic combined systolic and diastolic CHF, liver cirrhosis, DM-2, HTN, RA, depression, seizure disorder, CVA, chronic diarrhea and deafness presented to [facility] with increased shortness of breath, cough, wheeze, markedly elevated blood pressure, headache, dizziness and blurry vision.  Initially admitted for COPD exacerbation with uncontrolled hypertension.  Throughout her hospitalization at [facility] she was noted to have seizure-like episodes, at 1 point there was a concern for CVA as well.  Neurology consulted.  CT head and MRI brain without acute finding.  Carotid ultrasound with 50 to 69% bilateral ICA stenosis.  -She was transferred to [facility] for continuous EEG monitoring   Assessment & Plan:  Suspected pseudoseizure History seizure disorder -Concern at [facility] for possible pseudoseizures -Continued on home regimen of Keppra and Vimpat -Neurology following, on continuous EEG -MRI brain 10/23 was negative for acute findings, chronic white matter changes noted -Ammonia level also noted to be mildly elevated, started on lactulose, mental status improving -Discharge home today if stable, follow-up neurology input and continuous EEG results  Acute on chronic hypoxic respiratory failure - COPD exacerbation -Treated for COPD exacerbation at [facility], continues to have productive cough, no further wheezing noted today, volume status improving as well -Taper prednisone, continue duo nebs -chest x-ray noted cardiomegaly and mild pulmonary vascular congestion -Improved and stable, taper off prednisone, weaned down to 2 L which is her baseline  Acute on chronic combined CHF -Was volume overloaded initially, last echo  with EF of 50% -Diuresed with IV initially including yesterday -Stable on oral Lasix today  Accelerated hypertension -BP running higher, started amlodipine, diuresed with IV Lasix as well -Improved and stable  Type 2 diabetes mellitus Steroid-induced hyperglycemia -Glimepiride on hold, CBG stable, sensitive sliding scale  History of liver cirrhosis Mild hepatic encephalopathy -Improving on lactulose, decreased dose due to diarrhea  Hypokalemia -Replace  Hypophosphatemia, phos repleted.  Anxiety and depression -Continue home Celexa   Chronic diarrhea-previously evaluated for this by GI.  Colo showed polyps and nonbleeding internal hemorrhoid in 10/2020.  EGD the same time concerning for Barrett's esophagus, 2 cm hiatal hernia and gastric polyp.   Tobacco use disorder  Impaired hearing/deaf -Can communicate using in person sign language interpreter or by reading lips when she is alert      DVT prophylaxis: enoxaparin  Code Status: Full code Family Communication: No family at bedside, but updated son Level of care: Progressive Status is: Inpatient  Subjective: -Feels well, anxious to go home call  Objective: Vitals:   05/05/21 0424 05/05/21 0735 05/05/21 0743 05/05/21 1309  BP: (!) 158/64  (!) 163/58 132/60  Pulse: (!) 52  (!) 55 64  Resp: _0 Temp: 97.8 F (36.6 C)  98.1 F (36.7 C) 97.9 F (36.6 C)  TempSrc: Oral  Oral Oral  SpO2: 91% 93% 92% 91%    Intake/Output Summary (Last 24 hours) at 05/05/2021 1417 Last data filed at 05/04/2021 2238 Gross per 24 hour  Intake 240 ml  Output --  Net 240 ml   There were no vitals filed for this visit.   Examination:  Gen: Pleasant average built female sitting up in bed, awake alert, oriented to self and place, communicated using sign  language interpreter at bedside Lungs: Improved air movement, few basilar rales  CVS: S1S2/RRR Abd: soft, Non tender, non distended, BS present Extremities: No edema Skin:  no new rashes on exposed skin  Neuro: Moves all extremities, no localizing signs, flat affect, higher functioning difficult to assess  Procedures:  LTM  Consultants:  Neurology   Sch Meds:  Scheduled Meds:  amLODipine  10 mg Oral Daily   enoxaparin (LOVENOX) injection  40 mg Subcutaneous Q24H   fluticasone furoate-vilanterol  1 puff Inhalation Daily   furosemide  40 mg Oral Daily   insulin aspart  0-9 Units Subcutaneous TID WC   lacosamide  100 mg Oral BID   lactulose  10 g Oral Daily   levETIRAcetam  500 mg Oral BID   melatonin  3 mg Oral QHS   potassium chloride  20 mEq Oral Daily   predniSONE  30 mg Oral Q breakfast   Continuous Infusions:    PRN Meds:.acetaminophen **OR** acetaminophen, albuterol, hydrALAZINE  Antimicrobials: Anti-infectives (From admission, onward)    None      CBC: Recent Labs  Lab 05/01/21 0321 05/01/21 2241 05/03/21 0341 05/04/21 0628 05/05/21 0416  WBC 7.0 6.9 8.3 8.4 8.5  HGB 12.6 12.9 13.6 13.4 13.7  HCT 38.5 40.3 40.9 41.2 42.7  MCV 83.5 84.5 83.3 84.1 84.2  PLT 187 177 187 195 217   BMP &GFR Recent Labs  Lab 04/29/21 0550 04/30/21 0456 05/01/21 0321 05/01/21 2241 05/03/21 0341 05/04/21 0628 05/05/21 0416  NA 142 144 140 142 140 142 140  K 2.8* 3.6 3.5 4.4 2.9* 4.0 3.5  CL 103 107 107 109 102 104 102  CO2 _0 GLUCOSE 232* 143* 143* 87 99 95 101*  BUN _1 CREATININE 0.50 0.64 0.57 0.63 0.66 0.72 0.75  CALCIUM 8.3* 8.6* 8.2* 8.4* 8.8* 9.0 8.9  MG 2.1 2.0 1.9 QUANTITY NOT SUFFICIENT, UNABLE TO PERFORM TEST  --   --   --   PHOS 3.3 2.1* 3.1 3.2  --   --   --    Estimated Creatinine Clearance: 54.9 mL/min (by C-G formula based on SCr of 0.75 mg/dL). Liver & Pancreas: Recent Labs  Lab 04/29/21 0550 05/01/21 2241  AST  --  32  ALT  --  23  ALKPHOS  --  65  BILITOT  --  QUANTITY NOT SUFFICIENT, UNABLE TO PERFORM TEST  PROT  --  5.6*  ALBUMIN 3.0* 2.9*   No results for  input(s): LIPASE, AMYLASE in the last 168 hours. Recent Labs  Lab 05/02/21 1232  AMMONIA 46*   Diabetic: No results for input(s): HGBA1C in the last 72 hours.  Recent Labs  Lab 05/04/21 1735 05/04/21 2112 05/05/21 0654 05/05/21 0754 05/05/21 1205  GLUCAP 237* 210* 109* 119* 250*   Cardiac Enzymes: No results for input(s): CKTOTAL, CKMB, CKMBINDEX, TROPONINI in the last 168 hours. No results for input(s): PROBNP in the last 8760 hours. Coagulation Profile: No results for input(s): INR, PROTIME in the last 168 hours. Thyroid Function Tests: No results for input(s): TSH, T4TOTAL, FREET4, T3FREE, THYROIDAB in the last 72 hours. Lipid Profile: No results for input(s): CHOL, HDL, LDLCALC, TRIG, CHOLHDL, LDLDIRECT in the last 72 hours.  Anemia Panel: No results for input(s): VITAMINB12, FOLATE, FERRITIN, TIBC, IRON, RETICCTPCT in the last 72 hours. Urine analysis:    Component Value Date/Time   COLORURINE YELLOW (A) 07/29/2020 2146   APPEARANCEUR  HAZY (A) 07/29/2020 2146   APPEARANCEUR Clear 11/14/2011 1607   LABSPEC 1.018 07/29/2020 2146   LABSPEC 1.006 11/14/2011 1607   PHURINE 5.0 07/29/2020 2146   GLUCOSEU NEGATIVE 07/29/2020 2146   GLUCOSEU Negative 11/14/2011 [employer] 07/29/2020 2146   BILIRUBINUR NEGATIVE 07/29/2020 2146   BILIRUBINUR Negative 11/14/2011 [place] 07/29/2020 2146   PROTEINUR NEGATIVE 07/29/2020 2146   NITRITE NEGATIVE 07/29/2020 2146   LEUKOCYTESUR NEGATIVE 07/29/2020 2146   LEUKOCYTESUR Negative 11/14/2011 1607   Sepsis Labs: Invalid input(s): PROCALCITONIN, [place]  Microbiology: Recent Results (from the past 240 hour(s))  Resp Panel by RT-PCR (Flu A&B, Covid) Nasopharyngeal Swab     Status: None   Collection Time: 04/27/21 11:33 PM   Specimen: Nasopharyngeal Swab; Nasopharyngeal(NP) swabs in vial transport medium  Result Value Ref Range Status   SARS Coronavirus 2 by RT PCR NEGATIVE NEGATIVE Final     Comment: (NOTE) SARS-CoV-2 target nucleic acids are NOT DETECTED.  The SARS-CoV-2 RNA is generally detectable in upper respiratory specimens during the acute phase of infection. The lowest concentration of SARS-CoV-2 viral copies this assay can detect is 138 copies/mL. A negative result does not preclude SARS-Cov-2 infection and should not be used as the sole basis for treatment or other patient management decisions. A negative result may occur with  improper specimen collection/handling, submission of specimen other than nasopharyngeal swab, presence of viral mutation(s) within the areas targeted by this assay, and inadequate number of viral copies(<138 copies/mL). A negative result must be combined with clinical observations, patient history, and epidemiological information. The expected result is Negative.  Fact Sheet for Patients:  EntrepreneurPulse.com.au  Fact Sheet for Healthcare Providers:  IncredibleEmployment.be  This test is no t yet approved or cleared by the [place] FDA and  has been authorized for detection and/or diagnosis of SARS-CoV-2 by FDA under an Emergency Use Authorization (EUA). This EUA will remain  in effect (meaning this test can be used) for the duration of the COVID-19 declaration under Section 564(b)(1) of the Act, 21 U.S.C.section 360bbb-3(b)(1), unless the authorization is terminated  or revoked sooner.       Influenza A by PCR NEGATIVE NEGATIVE Final   Influenza B by PCR NEGATIVE NEGATIVE Final    Comment: (NOTE) The Xpert Xpress SARS-CoV-2/FLU/RSV plus assay is intended as an aid in the diagnosis of influenza from Nasopharyngeal swab specimens and should not be used as a sole basis for treatment. Nasal washings and aspirates are unacceptable for Xpert Xpress SARS-CoV-2/FLU/RSV testing.  Fact Sheet for Patients: EntrepreneurPulse.com.au  Fact Sheet for Healthcare  Providers: IncredibleEmployment.be  This test is not yet approved or cleared by the [place] FDA and has been authorized for detection and/or diagnosis of SARS-CoV-2 by FDA under an Emergency Use Authorization (EUA). This EUA will remain in effect (meaning this test can be used) for the duration of the COVID-19 declaration under Section 564(b)(1) of the Act, 21 U.S.C. section 360bbb-3(b)(1), unless the authorization is terminated or revoked.  Performed at [facility], [address] [place], [place] [address]     Radiology Studies: No results found.  Domenic Polite, MD [facility]  05/05/2021, 2:17 PM

## 2021-05-05 NOTE — Evaluation (Signed)
Physical Therapy Evaluation Patient Details Name: [name] MRN: [id number] DOB: 1950-11-16 Today's Date: 05/05/2021  History of Present Illness  Pt is a 70 y/o F admitted on 04/27/21 with c/c of SOB, HTN, & HA. Pt was initially admitted for COPD exacerbation with uncontrolled HTN but pt had a breakthrough seizure & stroke-like symptoms. Neurology was consulted by CT & MRI of brain was without acute findings. Transferred to [facility]. EEG negative. PMH: COPD/RF on 2L at night, combined CHF, liver cirrhosis, DM2, HTN, RA, depression, seizure disorder, CVA, chronic diarrhea & impaired hearing  Clinical Impression  PTA, patient lives with son and reports independence with mobility. Patient presents with weakness, impaired balance, and decreased activity tolerance. Patient requesting to ambulate without O2. Patient ambulating at supervision level with no AD and spO2 dropping as low as 86% and fluctuating between 86-90%. Instructed on pursed lip breathing with improvement. Donned 2L O2 [place] on return to room. Patient will benefit from skilled PT services during acute stay to address listed deficits. Recommend HHPT following discharge to maximize functional independence and safety.        Recommendations for follow up therapy are one component of a multi-disciplinary discharge planning process, led by the attending physician.  Recommendations may be updated based on patient status, additional functional criteria and insurance authorization.  Follow Up Recommendations Home health PT    Assistance Recommended at Discharge PRN  Functional Status Assessment Patient has had a recent decline in their functional status and demonstrates the ability to make significant improvements in function in a reasonable and predictable amount of time.  Equipment Recommendations  None recommended by PT    Recommendations for Other Services       Precautions / Restrictions Precautions Precautions: Fall Precaution  Comments: watch O2, seizure, deaf (reads lips well) Restrictions Weight Bearing Restrictions: No      Mobility  Bed Mobility Overal bed mobility: Modified Independent                  Transfers Overall transfer level: Needs assistance Equipment used: None Transfers: Sit to/from Stand Sit to Stand: Supervision                Ambulation/Gait Ambulation/Gait assistance: Supervision Gait Distance (Feet): 200 Feet Assistive device: None Gait Pattern/deviations: Step-through pattern;Decreased stride length Gait velocity: decreased   General Gait Details: supervision for safety. Mild balance deficits noted. Patient request to ambulate on RA. SpO2 drop to 86% and fluctuate between 86% and 90% during ambulation. Instructed on pursed lip breathing technique. Donned 2L O2 [place] on return to room  Stairs            Wheelchair Mobility    Modified Rankin (Stroke Patients Only)       Balance Overall balance assessment: Mild deficits observed, not formally tested                                           Pertinent Vitals/Pain Pain Assessment: No/denies pain    Home Living Family/patient expects to be discharged to:: Private residence Living Arrangements: Children Available Help at Discharge: Family;Available PRN/intermittently Type of Home: Apartment Home Access: Level entry       Home Layout: One level Home Equipment: Shower seat      Prior Function Prior Level of Function : Independent/Modified Independent  Mobility Comments: Pt reports she was independent without AD, does not drive.       Hand Dominance        Extremity/Trunk Assessment   Upper Extremity Assessment Upper Extremity Assessment: Overall WFL for tasks assessed    Lower Extremity Assessment Lower Extremity Assessment: Generalized weakness    Cervical / Trunk Assessment Cervical / Trunk Assessment: Normal  Communication   Communication:  Deaf (prefers to read lips)  Cognition Arousal/Alertness: Awake/alert Behavior During Therapy: WFL for tasks assessed/performed Overall Cognitive Status: Within Functional Limits for tasks assessed                                          General Comments      Exercises     Assessment/Plan    PT Assessment Patient needs continued PT services  PT Problem List Decreased strength;Decreased safety awareness;Decreased mobility;Decreased activity tolerance;Decreased balance;Cardiopulmonary status limiting activity       PT Treatment Interventions DME instruction;Therapeutic activities;Modalities;Gait training;Therapeutic exercise;Patient/family education;Stair training;Balance training;Functional mobility training;Neuromuscular re-education;Manual techniques    PT Goals (Current goals can be found in the Care Plan section)  Acute Rehab PT Goals Patient Stated Goal: to go home today PT Goal Formulation: With patient Time For Goal Achievement: 05/19/21 Potential to Achieve Goals: Good    Frequency Min 3X/week   Barriers to discharge        Co-evaluation               AM-PAC PT "6 Clicks" Mobility  Outcome Measure Help needed turning from your back to your side while in a flat bed without using bedrails?: None Help needed moving from lying on your back to sitting on the side of a flat bed without using bedrails?: None Help needed moving to and from a bed to a chair (including a wheelchair)?: None Help needed standing up from a chair using your arms (e.g., wheelchair or bedside chair)?: A Little Help needed to walk in hospital room?: A Little Help needed climbing 3-5 steps with a railing? : A Little 6 Click Score: 21    End of Session Equipment Utilized During Treatment: Gait belt Activity Tolerance: Patient tolerated treatment well Patient left: in bed;with call bell/phone within reach;with bed alarm set Nurse Communication: Mobility status PT Visit  Diagnosis: Muscle weakness (generalized) (M62.81);Unsteadiness on feet (R26.81);Difficulty in walking, not elsewhere classified (R26.2)    Time: 0856-9437 PT Time Calculation (min) (ACUTE ONLY): 25 min   Charges:   PT Evaluation $PT Eval Moderate Complexity: 1 Mod PT Treatments $Gait Training: 8-22 mins        Yaslene Lindamood A. Gilford Rile PT, DPT Acute Rehabilitation Services Pager [phone] Office [phone]   Linna Hoff 05/05/2021, 4:53 PM

## 2021-05-05 NOTE — Progress Notes (Signed)
LTM stopped now, No skin breakdown noted. Results pending.

## 2021-05-07 NOTE — Discharge Summary (Addendum)
Physician Discharge Summary  [name] IHK:[id number] DOB: October 03, 1950 DOA: 05/01/2021  PCP: [facility] [place] date: 05/01/2021 Discharge date: 05/05/2021  Time spent: 35mnutes  Recommendations for Outpatient Follow-up:  PCP in 1 week, [facility]  Discharge Diagnoses:  Principal Problem: ?Pseudoseizures History of seizure disorder Acute on chronic hypoxic respiratory failure COPD exacerbation Liver cirrhosis Acute on chronic diastolic CHF   [place] ([place]   Uncontrolled hypertension   [place] 2 diabetes mellitus [place]   [place]congestive heart [place]) [place]   Discharge Condition: Stable  Diet recommendation: Low-sodium, diabetic  There were no vitals filed for this visit.  History of present illness:  70year old F with PMH of COPD/RF on 2 L at night, chronic combined systolic and diastolic CHF, liver cirrhosis, DM-2, HTN, RA, depression, seizure disorder, CVA, chronic diarrhea and deafness [facility], [facility], wheeze, markedly elevated blood pressure, headache, dizziness and blurry vision.  Initially admitted for COPD exacerbation with uncontrolled hypertension.  [facility], at 1 point there was a concern for CVA as well.  Neurology consulted.  CT head and MRI brain without acute finding.  Carotid ultrasound with 50 to 69% bilateral ICA stenosis.  -[facility]  [facility] Course:   Suspected pseudoseizure History seizure disorder -[facility] for [facility] which were suspected to be pseudoseizures, [facility] -[facility]like episodes [facility] as well   -Continued on home regimen of Keppra and Vimpat -MRI brain 10/23 was negative for acute findings, chronic white matter changes noted -Ammonia level also noted to be mildly elevated, started on lactulose, mental status improving -Discharged home in a stable condition, advised follow-up with neurology in 1 month   Acute on chronic hypoxic respiratory failure - COPD exacerbation -[facility] continues to have productive cough, no further wheezing noted, volume status improving as well -Taper prednisone, continue duo nebs -chest x-ray noted cardiomegaly and mild pulmonary vascular congestion -Improved and stable, taper off prednisone, weaned down to 2 L which is her baseline   Acute on chronic combined CHF -Was volume overloaded initially, last echo with EF of 50% -Diuresed with IV initially including yesterday -Stable on oral Lasix today   Accelerated hypertension -started amlodipine, diuresed with IV Lasix as well -Improved and stable   Type 2 diabetes mellitus Steroid-induced hyperglycemia -Glimepiride resumed   History of liver cirrhosis Mild hepatic encephalopathy -Improving on lactulose, decreased dose due to diarrhea  Hypokalemia -Replace   Hypophosphatemia, phos repleted.   Anxiety and depression -Continue home Celexa   Chronic diarrhea-previously evaluated for this by GI.  Colo showed polyps and nonbleeding internal hemorrhoid in 10/2020.  EGD the same time concerning for Barrett's esophagus, 2 cm hiatal hernia and gastric polyp.    Tobacco use disorder   Impaired hearing/deaf -Can communicate using in person sign language interpreter or by reading lips when she is alert      Procedures: Continuous EEG  Consultations: Neurology  Discharge Exam: Vitals:   05/05/21 0743 05/05/21 1309  BP: (!) 163/58 132/60  Pulse: (!) 55 64  Resp: 18 18  Temp: 98.1 F (36.7 C) 97.9 F (36.6 C)  SpO2: 92%  91%      Discharge  Instructions    Allergies as of 05/05/2021       Reactions   Aspirin Itching   Celebrex [celecoxib] Itching   itching   Ciprofloxacin Itching   Codeine Itching   Fosphenytoin Itching   Levaquin [levofloxacin In D5w] Itching   Levofloxacin Itching   Lovastatin Itching   Penicillins    Documentation indicates severe reaction Pt tolerated cephalosporin without adverse reaction 09/18   Pravastatin Itching   Sulfa Antibiotics Itching        Medication List     STOP taking these medications    gabapentin 100 MG capsule Commonly known as: NEURONTIN   guaiFENesin 600 MG 12 hr tablet Commonly known as: MUCINEX   omeprazole 20 MG capsule Commonly known as: PRILOSEC       TAKE these medications    acetaminophen 325 MG tablet Commonly known as: TYLENOL Take 2 tablets (650 mg total) by mouth every 6 (six) hours as needed for mild pain (or Fever >/= 101).   albuterol 108 (90 Base) MCG/ACT inhaler Commonly known as: VENTOLIN HFA Inhale 2 puffs into the lungs every 4 (four) hours as needed for wheezing or shortness of breath.   albuterol (2.5 MG/3ML) 0.083% nebulizer solution Commonly known as: PROVENTIL Take 3 mLs (2.5 mg total) by nebulization every 4 (four) hours as needed for wheezing or shortness of breath.   citalopram 10 MG tablet Commonly known as: CELEXA Take 10 mg by mouth daily.   clopidogrel 75 MG tablet Commonly known as: PLAVIX Take 1 tablet (75 mg total) by mouth daily.   furosemide 40 MG tablet Commonly known as: LASIX Take 40 mg by mouth daily.   glimepiride 2 MG tablet Commonly known as: AMARYL Take 2 mg by mouth daily.   Lacosamide 150 MG Tabs Take 1 tablet (150 mg total) by mouth 2 (two) times daily.   lactulose 10 GM/15ML solution Commonly known as: CHRONULAC Take 15 mLs (10 g total) by mouth daily.   levETIRAcetam 500 MG tablet Commonly known as: KEPPRA Take 1 tablet (500 mg total) by mouth 2 (two) times daily.   LORazepam 2  MG/ML injection Commonly known as: ATIVAN Inject 0.5 mLs (1 mg total) into the vein every hour as needed for seizure.   losartan 100 MG tablet Commonly known as: COZAAR Take 100 mg by mouth daily.   metoprolol succinate 25 MG 24 hr tablet Commonly known as: TOPROL-XL Take 1 tablet (25 mg total) by mouth daily.   montelukast 10 MG tablet Commonly known as: SINGULAIR Take 10 mg by mouth at bedtime.   nicotine 14 mg/24hr patch Commonly known as: NICODERM CQ - dosed in mg/24 hours Place 1 patch (14 mg total) onto the skin daily.   pantoprazole 40 MG tablet Commonly known as: PROTONIX Take 40 mg by mouth daily.   potassium chloride 10 MEQ tablet Commonly known as: KLOR-CON Take 2 tablets (20 mEq total) by mouth daily.   predniSONE 20 MG tablet Commonly known as: DELTASONE Take 1 tablet (20 mg total) by mouth daily with breakfast for 2 days. What changed: how much to take   rOPINIRole 0.5 MG tablet Commonly known as: REQUIP Take 0.5 mg by mouth at bedtime.   rosuvastatin 10 MG tablet Commonly known as: CRESTOR Take 10 mg by mouth at bedtime.   Symbicort 160-4.5 MCG/ACT inhaler Generic drug: budesonide-formoterol Inhale 2 puffs into the lungs 2 (two) times daily.  Allergies  Allergen Reactions   Aspirin Itching   Celebrex [Celecoxib] Itching    itching   Ciprofloxacin Itching   Codeine Itching   Fosphenytoin Itching   Levaquin [Levofloxacin In D5w] Itching   Levofloxacin Itching   Lovastatin Itching   Penicillins     Documentation indicates severe reaction  Pt tolerated cephalosporin without adverse reaction 09/18    Pravastatin Itching   Sulfa Antibiotics Itching      The results of significant diagnostics from this hospitalization (including imaging, microbiology, ancillary and laboratory) are listed below for reference.    Significant Diagnostic Studies: DG Chest 2 View  Result Date: 04/27/2021 CLINICAL DATA:  Shortness of breath and cough  EXAM: CHEST - 2 VIEW COMPARISON:  Chest x-ray 07/29/2020, CT chest 08/14/2017 FINDINGS: The heart and mediastinal contours are unchanged. Aortic calcification. No focal consolidation. No pulmonary edema. No pleural effusion. No pneumothorax. No acute osseous abnormality. Total reversed right shoulder arthroplasty. IMPRESSION: No active cardiopulmonary disease. Electronically Signed   By: Iven Finn M.D.   On: 04/27/2021 17:51   DG Abd 1 View  Result Date: 04/28/2021 CLINICAL DATA:  Shortness of breath, cough and hypertension. EXAM: ABDOMEN - 1 VIEW COMPARISON:  CT AP 02/06/2017 FINDINGS: Cholecystectomy clips identified. The bowel gas pattern is normal. No radio-opaque calculi or other significant radiographic abnormality are seen. IMPRESSION: Negative.  Nonobstructive bowel gas pattern. Electronically Signed   By: Kerby Moors M.D.   On: 04/28/2021 05:29   CT Head Wo Contrast  Result Date: 04/28/2021 CLINICAL DATA:  Cerebral hemorrhage suspected, headache, hypertension EXAM: CT HEAD WITHOUT CONTRAST TECHNIQUE: Contiguous axial images were obtained from the base of the skull through the vertex without intravenous contrast. COMPARISON:  04/27/2021 FINDINGS: Brain: No evidence of acute infarction, hemorrhage, cerebral edema, mass, mass effect, or midline shift. Ventricles and sulci are within normal limits for age. No extra-axial fluid collection. Periventricular white matter changes, likely the sequela of chronic small vessel ischemic disease. Vascular: No hyperdense vessel. Skull: Normal. Negative for fracture or focal lesion. Sinuses/Orbits: No acute finding. Other: Status post left canal wall mastoidectomy. IMPRESSION: No acute intracranial process. Electronically Signed   By: Merilyn Baba M.D.   On: 04/28/2021 00:46   CT Head Wo Contrast  Result Date: 04/27/2021 CLINICAL DATA:  Headache, elevated blood pressure, cough, and shortness of breath. EXAM: CT HEAD WITHOUT CONTRAST TECHNIQUE:  Contiguous axial images were obtained from the base of the skull through the vertex without intravenous contrast. COMPARISON:  CT head dated 07/29/2020. FINDINGS: Brain: No evidence of acute infarction, hemorrhage, hydrocephalus, extra-axial collection or mass lesion/mass effect. Periventricular white matter hypoattenuation likely represents chronic small vessel ischemic disease. Vascular: There are vascular calcifications in the carotid siphons. Skull: Negative for fracture or focal lesion. Sinuses/Orbits: The patient is status post a left canal wall up mastoidectomy. The paranasal sinuses are clear. Other: None. IMPRESSION: No acute intracranial process. Electronically Signed   By: Zerita Boers M.D.   On: 04/27/2021 18:13   MR BRAIN WO CONTRAST  Result Date: 04/28/2021 CLINICAL DATA:  70 year old female with headache, hypertensive, seizure. EXAM: MRI HEAD WITHOUT CONTRAST TECHNIQUE: Multiplanar, multiecho pulse sequences of the brain and surrounding structures were obtained without intravenous contrast. COMPARISON:  Head CT 0024 hours today.  Brain MRI 07/31/2020. FINDINGS: Brain: Stable cerebral volume since January. No restricted diffusion to suggest acute infarction. No midline shift, mass effect, evidence of mass lesion, ventriculomegaly, extra-axial collection or acute intracranial hemorrhage. Cervicomedullary junction and pituitary are within normal  limits. Stable gray and white matter signal throughout the brain. Patchy and confluent bilateral white matter T2 and FLAIR hyperintensity. Mild to moderate patchy hyperintensity in the pons. No cortical encephalomalacia, chronic cerebral blood products, hippocampal or mesial temporal lobe asymmetry. Vascular: Major intracranial vascular flow voids are stable since January. Skull and upper cervical spine: Negative. Sinuses/Orbits: Postoperative changes to both globes, otherwise negative orbits. Mild left ethmoid sinus mucosal thickening has regressed since  January. Other: Mastoids remain well aerated. Grossly normal visible internal auditory structures. Stable scalp soft tissues, left posterior convexity chronic scarring. IMPRESSION: No acute intracranial abnormality. Stable noncontrast MRI appearance of the brain since January with mild to moderate for age chronic white matter changes. Electronically Signed   By: Genevie Ann M.D.   On: 04/28/2021 06:26   US Carotid Bilateral (at [facility] and AP only)  Result Date: 04/28/2021 CLINICAL DATA:  Evaluate carotid arteries EXAM: BILATERAL CAROTID DUPLEX ULTRASOUND TECHNIQUE: Pearline Cables scale imaging, color Doppler and duplex ultrasound were performed of bilateral carotid and vertebral arteries in the neck. COMPARISON:  06/18/2020 FINDINGS: Criteria: Quantification of carotid stenosis is based on velocity parameters that correlate the residual internal carotid diameter with NASCET-based stenosis levels, using the diameter of the distal internal carotid lumen as the denominator for stenosis measurement. The following velocity measurements were obtained: RIGHT ICA: 163.7 cm/sec CCA: 04.5 cm/sec SYSTOLIC ICA/CCA RATIO:  2.3 ECA: 192.5 cm/sec LEFT ICA: 212.3 cm/sec CCA: 40.9 cm/sec SYSTOLIC ICA/CCA RATIO:  2.5 ECA: 326.2 cm/sec RIGHT CAROTID ARTERY: Plaque at the carotid bulb. RIGHT VERTEBRAL ARTERY:  Antegrade LEFT CAROTID ARTERY: Plaque in the distal CCA, in the bulb, and in the proximal ICA and ECA LEFT VERTEBRAL ARTERY:  Antegrade IMPRESSION: 1. 50-69% stenosis in the bilateral ICAs. 2. Antegrade flow in vertebral arteries. Electronically Signed   By: Merilyn Baba M.D.   On: 04/28/2021 03:38   DG CHEST PORT 1 VIEW  Result Date: 05/02/2021 CLINICAL DATA:  Hypoxia EXAM: PORTABLE CHEST 1 VIEW COMPARISON:  Chest x-ray 04/27/2021 FINDINGS: Heart appears mildly enlarged. Mediastinum is stable. Calcified plaques in the aortic arch. Mild pulmonary vascular congestion. No focal consolidation identified. Linear opacity in the right  midlung zone may represent platelike atelectasis. No significant pleural effusion. No pneumothorax. IMPRESSION: Mild cardiomegaly and mild pulmonary vascular congestion. Electronically Signed   By: Ofilia Neas M.D.   On: 05/02/2021 12:00   EEG adult  Result Date: 05/02/2021 Lora Havens, MD     05/02/2021  9:17 AM Patient Name: GOLA BRIBIESCA MRN: [id number] Epilepsy Attending: Lora Havens Referring Physician/Provider: Dr Derrick Ravel Date: 05/01/2021 Duration: 21.22 mins Patient history: 70 year old female with breakthrough seizure in the setting of severe electrolyte derangement.  EEG to evaluate for seizure. Level of alertness: Awake AEDs during EEG study: LEV, LCM Technical aspects: This EEG study was done with scalp electrodes positioned according to the 10-20 International system of electrode placement. Electrical activity was acquired at a sampling rate of _0  and reviewed with a high frequency filter of _1  and a low frequency filter of _2 . EEG data were recorded continuously and digitally stored. Description: The posterior dominant rhythm consists of 7 Hz activity of moderate voltage (25-35 uV) seen predominantly in posterior head regions, symmetric and reactive to eye opening and eye closing.  EEG showed continuous generalized 5 to 7 Hz theta slowing as well as intermittent generalized 2 to 3 Hz delta slowing.  Hyperventilation and photic stimulation were not performed.   ABNORMALITY - Continuous slow, generalized  IMPRESSION: This study is suggestive of moderate diffuse encephalopathy, nonspecific etiology. No seizures or epileptiform discharges were seen throughout the recording. Priyanka [name]   Overnight EEG with video  Result Date: 05/02/2021 Lora Havens, MD     05/03/2021  9:31 AM Patient Name: BRENLEIGH COLLET MRN: [id number] Epilepsy Attending: Lora Havens Referring Physician/Provider: Dr Kathrynn Speed Duration: 05/01/2021 2106 to 05/02/2021 2106   Patient history: 70 year old female with breakthrough seizure in the setting of severe electrolyte derangement.  EEG to evaluate for seizure.  Level of alertness: Awake, asleep AEDs during EEG study: LEV, LCM  Technical aspects: This EEG study was done with scalp electrodes positioned according to the 10-20 International system of electrode placement. Electrical activity was acquired at a sampling rate of _0  and reviewed with a high frequency filter of _1  and a low frequency filter of _2 . EEG data were recorded continuously and digitally stored.  Description: The posterior dominant rhythm consists of 7 Hz activity of moderate voltage (25-35 uV) seen predominantly in posterior head regions, symmetric and reactive to eye opening and eye closing. Sleep was characterized by vertex waves, sleep spindles (12 to 14 Hz), maximal frontocentral region. EEG showed continuous generalized 5 to 7 Hz theta slowing as well as intermittent generalized 2 to 3 Hz delta slowing.  Hyperventilation and photic stimulation were not performed.  Parts of study were difficult to interpret due to significant electrode artifact.  ABNORMALITY - Continuous slow, generalized  IMPRESSION: This study is suggestive of moderate diffuse encephalopathy, nonspecific etiology. No seizures or epileptiform discharges were seen throughout the recording.  Lora Havens    Microbiology: Recent Results (from the past 240 hour(s))  Resp Panel by RT-PCR (Flu A&B, Covid) Nasopharyngeal Swab     Status: None   Collection Time: 04/27/21 11:33 PM   Specimen: Nasopharyngeal Swab; Nasopharyngeal(NP) swabs in vial transport medium  Result Value Ref Range Status   SARS Coronavirus 2 by RT PCR NEGATIVE NEGATIVE Final    Comment: (NOTE) SARS-CoV-2 target nucleic acids are NOT DETECTED.  The SARS-CoV-2 RNA is generally detectable in upper respiratory specimens during the acute phase of infection. The lowest concentration of SARS-CoV-2 viral copies  this assay can detect is 138 copies/mL. A negative result does not preclude SARS-Cov-2 infection and should not be used as the sole basis for treatment or other patient management decisions. A negative result may occur with  improper specimen collection/handling, submission of specimen other than nasopharyngeal swab, presence of viral mutation(s) within the areas targeted by this assay, and inadequate number of viral copies(<138 copies/mL). A negative result must be combined with clinical observations, patient history, and epidemiological information. The expected result is Negative.  Fact Sheet for Patients:  EntrepreneurPulse.com.au  Fact Sheet for Healthcare Providers:  IncredibleEmployment.be  This test is no t yet approved or cleared by the [place] FDA and  has been authorized for detection and/or diagnosis of SARS-CoV-2 by FDA under an Emergency Use Authorization (EUA). This EUA will remain  in effect (meaning this test can be used) for the duration of the COVID-19 declaration under Section 564(b)(1) of the Act, 21 U.S.C.section 360bbb-3(b)(1), unless the authorization is terminated  or revoked sooner.       Influenza A by PCR NEGATIVE NEGATIVE Final   Influenza B by PCR NEGATIVE NEGATIVE Final    Comment: (NOTE) The Xpert Xpress SARS-CoV-2/FLU/RSV plus assay is intended as an aid in the diagnosis of influenza from Nasopharyngeal swab specimens and should not be  used as a sole basis for treatment. Nasal washings and aspirates are unacceptable for Xpert Xpress SARS-CoV-2/FLU/RSV testing.  Fact Sheet for Patients: EntrepreneurPulse.com.au  Fact Sheet for Healthcare Providers: IncredibleEmployment.be  This test is not yet approved or cleared by the [place] FDA and has been authorized for detection and/or diagnosis of SARS-CoV-2 by FDA under an Emergency Use Authorization (EUA). This EUA  will remain in effect (meaning this test can be used) for the duration of the COVID-19 declaration under Section 564(b)(1) of the Act, 21 U.S.C. section 360bbb-3(b)(1), unless the authorization is terminated or revoked.  Performed at [facility], [address] [place], [place] [address]      Labs: Basic Metabolic Panel: Recent Labs  Lab 05/01/21 0321 05/01/21 2241 05/03/21 0341 05/04/21 0628 05/05/21 0416  NA 140 142 140 142 140  K 3.5 4.4 2.9* 4.0 3.5  CL 107 109 102 104 102  CO2 _0 GLUCOSE 143* 87 99 95 101*  BUN _1 CREATININE 0.57 0.63 0.66 0.72 0.75  CALCIUM 8.2* 8.4* 8.8* 9.0 8.9  MG 1.9 QUANTITY NOT SUFFICIENT, UNABLE TO PERFORM TEST  --   --   --   PHOS 3.1 3.2  --   --   --    Liver Function Tests: Recent Labs  Lab 05/01/21 2241  AST 32  ALT 23  ALKPHOS 65  BILITOT QUANTITY NOT SUFFICIENT, UNABLE TO PERFORM TEST  PROT 5.6*  ALBUMIN 2.9*   No results for input(s): LIPASE, AMYLASE in the last 168 hours. Recent Labs  Lab 05/02/21 1232  AMMONIA 46*   CBC: Recent Labs  Lab 05/01/21 0321 05/01/21 2241 05/03/21 0341 05/04/21 0628 05/05/21 0416  WBC 7.0 6.9 8.3 8.4 8.5  HGB 12.6 12.9 13.6 13.4 13.7  HCT 38.5 40.3 40.9 41.2 42.7  MCV 83.5 84.5 83.3 84.1 84.2  PLT 187 177 187 195 217   Cardiac Enzymes: No results for input(s): CKTOTAL, CKMB, CKMBINDEX, TROPONINI in the last 168 hours. BNP: BNP (last 3 results) Recent Labs    07/31/20 0533 04/27/21 1729  BNP 140.5* 385.8*    ProBNP (last 3 results) No results for input(s): PROBNP in the last 8760 hours.  CBG: Recent Labs  Lab 05/04/21 1735 05/04/21 2112 05/05/21 0654 05/05/21 0754 05/05/21 1205  GLUCAP 237* 210* 109* 119* 250*       Signed:  Domenic Polite MD.  [facility] 05/07/2021, 2:59 PM

## 2021-07-08 ENCOUNTER — Other Ambulatory Visit: Payer: Self-pay

## 2021-07-08 ENCOUNTER — Emergency Department
Admission: EM | Admit: 2021-07-08 | Discharge: 2021-07-08 | Disposition: A | Discharge status: Home / Self Care | Payer: Medicare Other | Attending: Emergency Medicine | Admitting: Emergency Medicine

## 2021-07-08 DIAGNOSIS — Z5321 Procedure and treatment not carried out due to patient leaving prior to being seen by health care provider: Secondary | ICD-10-CM | POA: Insufficient documentation

## 2021-07-08 DIAGNOSIS — H9202 Otalgia, left ear: Secondary | ICD-10-CM | POA: Insufficient documentation

## 2021-07-08 DIAGNOSIS — R42 Dizziness and giddiness: Secondary | ICD-10-CM | POA: Insufficient documentation

## 2021-07-08 LAB — CBC
HCT: 46.1 % — ABNORMAL HIGH (ref 36.0–46.0)
Hemoglobin: 15.3 g/dL — ABNORMAL HIGH (ref 12.0–15.0)
MCH: 26.6 pg (ref 26.0–34.0)
MCHC: 33.2 g/dL (ref 30.0–36.0)
MCV: 80.2 fL (ref 80.0–100.0)
Platelets: 249 10*3/uL (ref 150–400)
RBC: 5.75 MIL/uL — ABNORMAL HIGH (ref 3.87–5.11)
RDW: 12.8 % (ref 11.5–15.5)
WBC: 9 10*3/uL (ref 4.0–10.5)
nRBC: 0 % (ref 0.0–0.2)

## 2021-07-08 LAB — BASIC METABOLIC PANEL
Anion gap: 13 (ref 5–15)
BUN: 12 mg/dL (ref 8–23)
CO2: 34 mmol/L — ABNORMAL HIGH (ref 22–32)
Calcium: 8.7 mg/dL — ABNORMAL LOW (ref 8.9–10.3)
Chloride: 90 mmol/L — ABNORMAL LOW (ref 98–111)
Creatinine, Ser: 0.55 mg/dL (ref 0.44–1.00)
GFR, Estimated: >60 mL/min (ref >60)
Glucose, Bld: 190 mg/dL — ABNORMAL HIGH (ref 70–99)
Potassium: 2.1 mmol/L — CL (ref 3.5–5.1)
Sodium: 137 mmol/L (ref 135–145)

## 2021-07-08 LAB — CBG MONITORING, ED: Glucose-Capillary: 200 mg/dL — ABNORMAL HIGH (ref 70–99)

## 2021-07-08 NOTE — ED Provider Notes (Signed)
Emergency Medicine Provider Triage Evaluation Note  [name] , a 71 y.o.female,  was evaluated in triage.  Pt complains of dizziness and ear pain.   Review of Systems  Positive: Right-sided ear pain, dizziness Negative: Denies fever, chest pain, vomiting  Physical Exam  There were no vitals filed for this visit. Gen:   Awake, no distress   Resp:  Normal effort  MSK:   Moves extremities without difficulty  Other:    Medical Decision Making  Given the patient's initial medical screening exam, the following diagnostic evaluation has been ordered. The patient will be placed in the appropriate treatment space, once one is available, to complete the evaluation and treatment. I have discussed the plan of care with the patient and I have advised the patient that an ED physician or mid-level practitioner will reevaluate their condition after the test results have been received, as the results may give them additional insight into the type of treatment they may need.    Diagnostics: Labs, EKG  Treatments: none immediately   Teodoro Spray, [place] 07/08/21 1332    Duffy Bruce, MD 07/08/21 [id number]

## 2021-07-08 NOTE — ED Notes (Signed)
Pt noted walking up into the parking lot with her family member

## 2021-07-08 NOTE — ED Triage Notes (Signed)
Pt comes into the ED via EMS from home, pt c/o dizziness with left ear pain. Pt is deaf Unsteady gait  122/74 Temp 98.1 92-94%RA, on O2 at night

## 2021-07-18 ENCOUNTER — Telehealth: Payer: Self-pay | Admitting: Emergency Medicine

## 2021-09-24 ENCOUNTER — Other Ambulatory Visit: Payer: Self-pay | Admitting: Physician Assistant

## 2021-09-24 DIAGNOSIS — Z1231 Encounter for screening mammogram for malignant neoplasm of breast: Secondary | ICD-10-CM

## 2021-09-29 ENCOUNTER — Observation Stay
Admission: EM | Admit: 2021-09-29 | Discharge: 2021-10-01 | Disposition: A | Discharge status: Home / Self Care | Payer: Medicare Other | Attending: Internal Medicine | Admitting: Internal Medicine

## 2021-09-29 ENCOUNTER — Other Ambulatory Visit: Payer: Self-pay

## 2021-09-29 DIAGNOSIS — Z79899 Other long term (current) drug therapy: Secondary | ICD-10-CM | POA: Insufficient documentation

## 2021-09-29 DIAGNOSIS — Z7984 Long term (current) use of oral hypoglycemic drugs: Secondary | ICD-10-CM | POA: Insufficient documentation

## 2021-09-29 DIAGNOSIS — J45909 Unspecified asthma, uncomplicated: Secondary | ICD-10-CM | POA: Diagnosis not present

## 2021-09-29 DIAGNOSIS — I110 Hypertensive heart disease with heart failure: Secondary | ICD-10-CM | POA: Diagnosis not present

## 2021-09-29 DIAGNOSIS — R778 Other specified abnormalities of plasma proteins: Secondary | ICD-10-CM | POA: Diagnosis not present

## 2021-09-29 DIAGNOSIS — Z720 Tobacco use: Secondary | ICD-10-CM | POA: Diagnosis present

## 2021-09-29 DIAGNOSIS — J449 Chronic obstructive pulmonary disease, unspecified: Secondary | ICD-10-CM | POA: Diagnosis present

## 2021-09-29 DIAGNOSIS — R45851 Suicidal ideations: Secondary | ICD-10-CM | POA: Diagnosis not present

## 2021-09-29 DIAGNOSIS — I10 Essential (primary) hypertension: Secondary | ICD-10-CM | POA: Diagnosis present

## 2021-09-29 DIAGNOSIS — R7989 Other specified abnormal findings of blood chemistry: Secondary | ICD-10-CM

## 2021-09-29 DIAGNOSIS — F32A Depression, unspecified: Secondary | ICD-10-CM | POA: Diagnosis present

## 2021-09-29 DIAGNOSIS — F430 Acute stress reaction: Principal | ICD-10-CM | POA: Diagnosis present

## 2021-09-29 DIAGNOSIS — F331 Major depressive disorder, recurrent, moderate: Secondary | ICD-10-CM | POA: Diagnosis not present

## 2021-09-29 DIAGNOSIS — F17210 Nicotine dependence, cigarettes, uncomplicated: Secondary | ICD-10-CM | POA: Insufficient documentation

## 2021-09-29 DIAGNOSIS — Z8673 Personal history of transient ischemic attack (TIA), and cerebral infarction without residual deficits: Secondary | ICD-10-CM

## 2021-09-29 DIAGNOSIS — Z7902 Long term (current) use of antithrombotics/antiplatelets: Secondary | ICD-10-CM | POA: Insufficient documentation

## 2021-09-29 DIAGNOSIS — R0602 Shortness of breath: Secondary | ICD-10-CM | POA: Diagnosis present

## 2021-09-29 DIAGNOSIS — I5032 Chronic diastolic (congestive) heart failure: Secondary | ICD-10-CM | POA: Diagnosis present

## 2021-09-29 DIAGNOSIS — Z96611 Presence of right artificial shoulder joint: Secondary | ICD-10-CM | POA: Diagnosis not present

## 2021-09-29 DIAGNOSIS — R569 Unspecified convulsions: Secondary | ICD-10-CM

## 2021-09-29 LAB — CBC
HCT: 44.3 % (ref 36.0–46.0)
Hemoglobin: 14.1 g/dL (ref 12.0–15.0)
MCH: 26.6 pg (ref 26.0–34.0)
MCHC: 31.8 g/dL (ref 30.0–36.0)
MCV: 83.4 fL (ref 80.0–100.0)
Platelets: 216 10*3/uL (ref 150–400)
RBC: 5.31 MIL/uL — ABNORMAL HIGH (ref 3.87–5.11)
RDW: 14.9 % (ref 11.5–15.5)
WBC: 7.9 10*3/uL (ref 4.0–10.5)
nRBC: 0 % (ref 0.0–0.2)

## 2021-09-29 LAB — COMPREHENSIVE METABOLIC PANEL
ALT: 20 U/L (ref 0–44)
AST: 24 U/L (ref 15–41)
Albumin: 4 g/dL (ref 3.5–5.0)
Alkaline Phosphatase: 99 U/L (ref 38–126)
Anion gap: 12 (ref 5–15)
BUN: 7 mg/dL — ABNORMAL LOW (ref 8–23)
CO2: 25 mmol/L (ref 22–32)
Calcium: 9.4 mg/dL (ref 8.9–10.3)
Chloride: 103 mmol/L (ref 98–111)
Creatinine, Ser: 0.63 mg/dL (ref 0.44–1.00)
GFR, Estimated: >60 mL/min (ref >60)
Glucose, Bld: 169 mg/dL — ABNORMAL HIGH (ref 70–99)
Potassium: 4.1 mmol/L (ref 3.5–5.1)
Sodium: 140 mmol/L (ref 135–145)
Total Bilirubin: 0.6 mg/dL (ref 0.3–1.2)
Total Protein: 7.7 g/dL (ref 6.5–8.1)

## 2021-09-29 LAB — ACETAMINOPHEN LEVEL: Acetaminophen (Tylenol), Serum: <10 ug/mL — ABNORMAL LOW (ref 10–30)

## 2021-09-29 LAB — ETHANOL: Alcohol, Ethyl (B): <10 mg/dL (ref <10)

## 2021-09-29 LAB — SALICYLATE LEVEL: Salicylate Lvl: <7 mg/dL — ABNORMAL LOW (ref 7.0–30.0)

## 2021-09-29 NOTE — ED Triage Notes (Signed)
In triage, When asked if she wishes to go end her life, pt states " yes, but then no."  ?

## 2021-09-29 NOTE — ED Triage Notes (Signed)
IVC- Daughter called PD. Pt stated to PD that she doesn't want to live anymore.  ?

## 2021-09-29 NOTE — ED Notes (Signed)
This nurse, Lattie Haw, tech, and [place] [employer] in room with pt. Pt initially uncooperative with staff refusing to change into hospital scrubs. Staff and PD explained to pt process for IVC multiple times and pt agreeable.  ?Pt dressed out into hospital scrubs. Pt belongings placed in hospital belongings bag and labeled appropriately with pt label.  ? ?Pt belongings include;  ?1 lighter ?1 black sweater ?1 pink Tshirt ?I pair of pants ?1 pair grey shoes ?1 pair of socks ?1 pair of underwear ?

## 2021-09-29 NOTE — ED Provider Notes (Signed)
? ?[facility] ?Provider Note ? ? ? Event Date/Time  ? First MD Initiated Contact with Patient 09/29/21 2311   ?  (approximate) ? ? ?History  ? ?Mental Health Problem ? ? ?HPI ? ?Patient is able to read lips ? ?[name] is a 71 y.o. female brought to the ED under IVC via BPD for suicidal ideation.  Patient reports she was upset with her family and stated she wanted to hurt herself.  Denies plan to do so.  States she last had suicidal ideation in 1999.  Denies active HI/AH/VH. ?  ? ? ?Past Medical History  ? ?Past Medical History:  ?Diagnosis Date  ? Asthma   ? CHF (congestive heart failure) ([place])   ? Cirrhosis, non-alcoholic ([place])   ? COPD (chronic obstructive pulmonary disease) ([place])   ? Deaf   ? Depression   ? Diabetes mellitus without complication ([place])   ? GERD (gastroesophageal reflux disease)   ? Heart murmur   ? Hepatitis   ? History of rheumatic fever   ? History of scarlet fever   ? Hypertension   ? IBS (irritable bowel syndrome)   ? Lymph node disorder   ? arm  ? Neuropathy   ? On home oxygen therapy   ? hs  ? Orthopnea   ? Osteoarthritis   ? RA (rheumatoid arthritis) ([place])   ? RLS (restless legs syndrome)   ? Seizures ([place])   ? Shortness of breath dyspnea   ? Sleep apnea   ? Stroke [facility])   ? tia  ? ? ? ?Active Problem List  ? ?Patient Active Problem List  ? Diagnosis Date Noted  ? Shortness of breath 09/30/2021  ? Elevated troponin 09/30/2021  ? Uncontrolled hypertension 05/01/2021  ? Type 2 diabetes mellitus ([place]) 05/01/2021  ? CHF (congestive heart failure) ([place]) 05/01/2021  ? Breakthrough seizure ([place]) 04/28/2021  ? Aspiration pneumonia ([place]) 07/29/2020  ? S/P reverse total shoulder arthroplasty, right 12/21/2019  ? Comminuted fracture of right humerus 08/05/2019  ? History of CVA in adulthood 08/05/2019  ? COPD (chronic obstructive pulmonary disease) ([place])   ? Deaf   ? On home oxygen therapy   ? Cirrhosis, non-alcoholic ([place])   ? Asthma   ? Fall   ? Closed comminuted  fracture of right humerus   ? Hypomagnesemia   ? PNA (pneumonia) 03/01/2018  ? Bronchitis 09/23/2017  ? Chronic diastolic heart failure ([place]) 08/26/2017  ? Tobacco use 08/26/2017  ? Seizure disorder ([place]) 06/15/2017  ? Goals of care, counseling/discussion 05/06/2017  ? Status epilepticus ([place])   ? CAP (community acquired pneumonia)   ? Hypoxia   ? Hypokalemia 02/12/2017  ? GERD (gastroesophageal reflux disease) 03/23/2016  ? Depression 03/23/2016  ? Diabetes mellitus without complication ([place]) 93/90/3009  ? Essential hypertension 03/22/2016  ? Neuropathy 03/22/2016  ? Seizure ([place]) 03/22/2016  ? COPD exacerbation ([place]) 03/31/2015  ? ? ? ?Past Surgical History  ? ?Past Surgical History:  ?Procedure Laterality Date  ? ABDOMINAL HYSTERECTOMY    ? BREAST BIOPSY Right 10/30/2020  ? Stereo Bx, X-clip, path pending   ? CATARACT EXTRACTION W/PHACO Right 11/23/2014  ? Procedure: CATARACT EXTRACTION PHACO AND INTRAOCULAR LENS PLACEMENT (IOC);  Surgeon: Lyla Glassing, MD;  Location: [facility] ORS;  Service: Ophthalmology;  Laterality: Right;  ? CATARACT EXTRACTION W/PHACO Left 12/14/2014  ? Procedure: CATARACT EXTRACTION PHACO AND INTRAOCULAR LENS PLACEMENT (IOC);  Surgeon: Lyla Glassing, MD;  Location: [facility] ORS;  Service: Ophthalmology;  Laterality:  Left;  US:01:16.6 ?AP:15.8 ?CDE:12.14  ? CESAREAN SECTION    ? CHOLECYSTECTOMY    ? COLONOSCOPY WITH PROPOFOL N/A 10/08/2020  ? Procedure: COLONOSCOPY WITH PROPOFOL;  Surgeon: Toledo, Benay Pike, MD;  Location: [facility] ENDOSCOPY;  Service: Gastroenterology;  Laterality: N/A;  ? ESOPHAGOGASTRODUODENOSCOPY (EGD) WITH PROPOFOL N/A 10/08/2020  ? Procedure: ESOPHAGOGASTRODUODENOSCOPY (EGD) WITH PROPOFOL;  Surgeon: Toledo, Benay Pike, MD;  Location: [facility] ENDOSCOPY;  Service: Gastroenterology;  Laterality: N/A;  DM ?DEAF, NEEDS SIGN INTERPRETER PER SON  ? REVERSE SHOULDER ARTHROPLASTY Right 12/21/2019  ? Procedure: REVERSE SHOULDER ARTHROPLASTY;  Surgeon: Lovell Sheehan, MD;  Location: [facility] ORS;   Service: Orthopedics;  Laterality: Right;  ? THUMB ARTHROSCOPY    ? TONSILLECTOMY    ? TYMPANOPLASTY    ? muliple  ? ? ? ?Home Medications  ? ?Prior to Admission medications   ?Medication Sig Start Date End Date Taking? Authorizing Provider  ?citalopram (CELEXA) 10 MG tablet Take 10 mg by mouth daily.   Yes [provider]  ?clopidogrel (PLAVIX) 75 MG tablet Take 1 tablet (75 mg total) by mouth daily. 07/17/16  Yes Vaughan Basta, MD  ?Asencion Islam ALLERGY RELIEF 50 MCG/ACT nasal spray Place 1 spray into both nostrils 2 (two) times daily. 08/29/21  Yes [provider]  ?furosemide (LASIX) 40 MG tablet Take 40 mg by mouth daily. 07/22/18  Yes [provider]  ?gabapentin (NEURONTIN) 100 MG capsule Take 100 mg by mouth 2 (two) times daily. 09/18/21  Yes [provider]  ?glimepiride (AMARYL) 2 MG tablet Take 2 mg by mouth daily.   Yes [provider]  ?lacosamide 150 MG TABS Take 1 tablet (150 mg total) by mouth 2 (two) times daily. 08/01/20  Yes Hosie Poisson, MD  ?lactulose (CHRONULAC) 10 GM/15ML solution Take 15 mLs (10 g total) by mouth daily. 05/06/21  Yes Domenic Polite, MD  ?levETIRAcetam (KEPPRA) 500 MG tablet Take 1 tablet (500 mg total) by mouth 2 (two) times daily. 04/30/21  Yes Val Riles, MD  ?losartan (COZAAR) 100 MG tablet Take 100 mg by mouth daily.   Yes [provider]  ?metoprolol succinate (TOPROL-XL) 25 MG 24 hr tablet Take 1 tablet (25 mg total) by mouth daily. 08/08/19  Yes Thornell Mule, MD  ?montelukast (SINGULAIR) 10 MG tablet Take 10 mg by mouth at bedtime.  03/18/16  Yes [provider]  ?pantoprazole (PROTONIX) 40 MG tablet Take 40 mg by mouth daily.   Yes [provider]  ?potassium chloride (KLOR-CON) 10 MEQ tablet Take 2 tablets (20 mEq total) by mouth daily. 08/08/19 09/29/21 Yes Thornell Mule, MD  ?rOPINIRole (REQUIP) 0.5 MG tablet Take 0.5 mg by mouth at bedtime.   Yes [provider]  ?rosuvastatin  (CRESTOR) 10 MG tablet Take 10 mg by mouth at bedtime. 04/21/17  Yes [provider]  ?SPIRIVA RESPIMAT 2.5 MCG/ACT AERS Inhale 2 puff as directed once a day 09/19/21  Yes [provider]  ?spironolactone (ALDACTONE) 25 MG tablet Take 25 mg by mouth daily. 09/25/21  Yes [provider]  ?acetaminophen (TYLENOL) 325 MG tablet Take 2 tablets (650 mg total) by mouth every 6 (six) hours as needed for mild pain (or Fever >/= 101). ?Patient not taking: Reported on 12/07/2019 08/08/19   Thornell Mule, MD  ?albuterol (PROVENTIL HFA;VENTOLIN HFA) 108 (90 BASE) MCG/ACT inhaler Inhale 2 puffs into the lungs every 4 (four) hours as needed for wheezing or shortness of breath.    [provider]  ?albuterol (PROVENTIL) (2.5  MG/3ML) 0.083% nebulizer solution Take 3 mLs (2.5 mg total) by nebulization every 4 (four) hours as needed for wheezing or shortness of breath. ?Patient not taking: Reported on 04/28/2021 09/23/17   Alisa Graff, FNP  ?ipratropium-albuterol (DUONEB) 0.5-2.5 (3) MG/3ML SOLN Take 3 mLs by nebulization every 6 (six) hours as needed. 09/19/21   [provider]  ? ? ? ?Allergies  ?Aspirin, Celebrex [celecoxib], Ciprofloxacin, Codeine, Fosphenytoin, Levaquin [levofloxacin in d5w], Levofloxacin, Lovastatin, Penicillins, Pravastatin, and Sulfa antibiotics ? ? ?Family History  ? ?Family History  ?Problem Relation Age of Onset  ? Lung cancer Mother   ? CAD Father   ? Breast cancer Sister   ? ? ? ?Physical Exam  ?Triage Vital Signs: ?ED Triage Vitals  ?Enc Vitals Group  ?   BP 09/29/21 2134 136/82  ?   Pulse Rate 09/29/21 2134 69  ?   Resp 09/29/21 2134 18  ?   Temp 09/29/21 2134 (!) 97.5 ?F (36.4 ?C)  ?   Temp Source 09/29/21 2134 Oral  ?   SpO2 09/29/21 2134 99 %  ?   Weight 09/29/21 2134 145 lb (65.8 kg)  ?   Height 09/29/21 2134 5' 3" (1.6 m)  ?   Head Circumference --   ?   Peak Flow --   ?   Pain Score 09/29/21 2133 0  ?   Pain Loc --   ?   Pain Edu? --   ?   Excl. in [place]? --    ? ? ?Updated Vital Signs: ?BP (!) 138/55 (BP Location: Right Arm)   Pulse (!) 50   Temp 98 ?F (36.7 ?C) (Oral)   Resp 20   Ht 5' 3" (1.6 m)   Wt 65.8 kg   SpO2 97%   BMI 25.69 kg/m?  ? ? ?General: Awake, no dist

## 2021-09-30 ENCOUNTER — Observation Stay (HOSPITAL_BASED_OUTPATIENT_CLINIC_OR_DEPARTMENT_OTHER)
Admit: 2021-09-30 | Discharge: 2021-09-30 | Disposition: A | Discharge status: Home / Self Care | Payer: Medicare Other | Attending: Internal Medicine | Admitting: Internal Medicine

## 2021-09-30 ENCOUNTER — Other Ambulatory Visit: Payer: Self-pay

## 2021-09-30 ENCOUNTER — Observation Stay: Payer: Medicare Other

## 2021-09-30 DIAGNOSIS — R0609 Other forms of dyspnea: Secondary | ICD-10-CM

## 2021-09-30 DIAGNOSIS — F430 Acute stress reaction: Principal | ICD-10-CM

## 2021-09-30 DIAGNOSIS — R778 Other specified abnormalities of plasma proteins: Secondary | ICD-10-CM

## 2021-09-30 DIAGNOSIS — R0602 Shortness of breath: Secondary | ICD-10-CM | POA: Diagnosis present

## 2021-09-30 LAB — BASIC METABOLIC PANEL
Anion gap: 11 (ref 5–15)
BUN: 7 mg/dL — ABNORMAL LOW (ref 8–23)
CO2: 23 mmol/L (ref 22–32)
Calcium: 9.3 mg/dL (ref 8.9–10.3)
Chloride: 106 mmol/L (ref 98–111)
Creatinine, Ser: 0.54 mg/dL (ref 0.44–1.00)
GFR, Estimated: >60 mL/min (ref >60)
Glucose, Bld: 118 mg/dL — ABNORMAL HIGH (ref 70–99)
Potassium: 4.2 mmol/L (ref 3.5–5.1)
Sodium: 140 mmol/L (ref 135–145)

## 2021-09-30 LAB — URINE DRUG SCREEN, QUALITATIVE (ARMC ONLY)
Amphetamines, Ur Screen: NOT DETECTED
Barbiturates, Ur Screen: NOT DETECTED
Benzodiazepine, Ur Scrn: NOT DETECTED
Cannabinoid 50 Ng, Ur ~~LOC~~: NOT DETECTED
Cocaine Metabolite,Ur ~~LOC~~: NOT DETECTED
MDMA (Ecstasy)Ur Screen: NOT DETECTED
Methadone Scn, Ur: NOT DETECTED
Opiate, Ur Screen: NOT DETECTED
Phencyclidine (PCP) Ur S: NOT DETECTED
Tricyclic, Ur Screen: NOT DETECTED

## 2021-09-30 LAB — BRAIN NATRIURETIC PEPTIDE: B Natriuretic Peptide: 192 pg/mL — ABNORMAL HIGH (ref 0.0–100.0)

## 2021-09-30 LAB — ECHOCARDIOGRAM COMPLETE
AR max vel: 1.42 cm2
AV Area VTI: 1.22 cm2
AV Area mean vel: 1.44 cm2
AV Mean grad: 11 mmHg
AV Peak grad: 22.8 mmHg
Ao pk vel: 2.39 m/s
Area-P 1/2: 4.06 cm2
Height: 63 in
MV VTI: 1.58 cm2
S' Lateral: 3.18 cm
Weight: 2320 oz

## 2021-09-30 LAB — CBC
HCT: 43.4 % (ref 36.0–46.0)
Hemoglobin: 13.8 g/dL (ref 12.0–15.0)
MCH: 26.3 pg (ref 26.0–34.0)
MCHC: 31.8 g/dL (ref 30.0–36.0)
MCV: 82.7 fL (ref 80.0–100.0)
Platelets: 221 10*3/uL (ref 150–400)
RBC: 5.25 MIL/uL — ABNORMAL HIGH (ref 3.87–5.11)
RDW: 15 % (ref 11.5–15.5)
WBC: 9.5 10*3/uL (ref 4.0–10.5)
nRBC: 0 % (ref 0.0–0.2)

## 2021-09-30 LAB — APTT: aPTT: 31 seconds (ref 24–36)

## 2021-09-30 LAB — HIV ANTIBODY (ROUTINE TESTING W REFLEX): HIV Screen 4th Generation wRfx: NONREACTIVE

## 2021-09-30 LAB — HEPARIN LEVEL (UNFRACTIONATED)
Heparin Unfractionated: 0.28 IU/mL — ABNORMAL LOW (ref 0.30–0.70)
Heparin Unfractionated: 0.48 IU/mL (ref 0.30–0.70)

## 2021-09-30 LAB — PROTIME-INR
INR: 1.1 (ref 0.8–1.2)
Prothrombin Time: 14 seconds (ref 11.4–15.2)

## 2021-09-30 LAB — TROPONIN I (HIGH SENSITIVITY)
Troponin I (High Sensitivity): 158 ng/L (ref <18)
Troponin I (High Sensitivity): 710 ng/L (ref <18)

## 2021-09-30 MED ORDER — LORAZEPAM 2 MG/ML IJ SOLN: 2.0000 mg | INTRAMUSCULAR | Status: DC | PRN

## 2021-09-30 MED ORDER — FLUTICASONE PROPIONATE 50 MCG/ACT NA SUSP
1.0000 | Freq: Two times a day (BID) | NASAL | Status: DC
  Administered 2021-10-01: 1 via NASAL
  Filled 2021-09-30 (×2): qty 16

## 2021-09-30 MED ORDER — ALBUTEROL SULFATE HFA 108 (90 BASE) MCG/ACT IN AERS: 2.0000 | INHALATION_SPRAY | RESPIRATORY_TRACT | Status: DC | PRN

## 2021-09-30 MED ORDER — ACETAMINOPHEN 325 MG PO TABS
650.0000 mg | ORAL_TABLET | Freq: Once | ORAL | Status: AC
Start: 2021-09-30 — End: 2021-09-30
  Administered 2021-09-30: 650 mg via ORAL
  Filled 2021-09-30: qty 2

## 2021-09-30 MED ORDER — PANTOPRAZOLE SODIUM 40 MG PO TBEC
40.0000 mg | DELAYED_RELEASE_TABLET | Freq: Every day | ORAL | Status: DC
  Administered 2021-09-30 – 2021-10-01 (×2): 40 mg via ORAL
  Filled 2021-09-30 (×2): qty 1

## 2021-09-30 MED ORDER — CITALOPRAM HYDROBROMIDE 20 MG PO TABS
10.0000 mg | ORAL_TABLET | Freq: Every day | ORAL | Status: DC
  Administered 2021-09-30 – 2021-10-01 (×2): 10 mg via ORAL
  Filled 2021-09-30 (×2): qty 1

## 2021-09-30 MED ORDER — GABAPENTIN 100 MG PO CAPS
100.0000 mg | ORAL_CAPSULE | Freq: Two times a day (BID) | ORAL | Status: DC
Start: 2021-09-30 — End: 2021-10-01
  Administered 2021-09-30 – 2021-10-01 (×3): 100 mg via ORAL
  Filled 2021-09-30 (×3): qty 1

## 2021-09-30 MED ORDER — ENOXAPARIN SODIUM 40 MG/0.4ML IJ SOSY: 40.0000 mg | PREFILLED_SYRINGE | INTRAMUSCULAR | Status: DC

## 2021-09-30 MED ORDER — TIOTROPIUM BROMIDE MONOHYDRATE 18 MCG IN CAPS
18.0000 ug | ORAL_CAPSULE | Freq: Every day | RESPIRATORY_TRACT | Status: DC
  Administered 2021-10-01: 18 ug via RESPIRATORY_TRACT
  Filled 2021-09-30 (×3): qty 5

## 2021-09-30 MED ORDER — ROPINIROLE HCL 1 MG PO TABS
0.5000 mg | ORAL_TABLET | Freq: Every day | ORAL | Status: DC
  Administered 2021-09-30: 0.5 mg via ORAL
  Filled 2021-09-30: qty 1

## 2021-09-30 MED ORDER — ACETAMINOPHEN 650 MG RE SUPP: 650.0000 mg | Freq: Four times a day (QID) | RECTAL | Status: DC | PRN

## 2021-09-30 MED ORDER — ACETAMINOPHEN 325 MG PO TABS
650.0000 mg | ORAL_TABLET | Freq: Four times a day (QID) | ORAL | Status: DC | PRN
  Administered 2021-09-30: 650 mg via ORAL
  Filled 2021-09-30: qty 2

## 2021-09-30 MED ORDER — HEPARIN (PORCINE) 25000 UT/250ML-% IV SOLN
950.0000 [IU]/h | INTRAVENOUS | Status: DC
  Administered 2021-09-30: 850 [IU]/h via INTRAVENOUS
  Administered 2021-10-01: 950 [IU]/h via INTRAVENOUS
  Filled 2021-09-30 (×2): qty 250

## 2021-09-30 MED ORDER — METOPROLOL SUCCINATE ER 25 MG PO TB24
25.0000 mg | ORAL_TABLET | Freq: Every day | ORAL | Status: DC
  Filled 2021-09-30: qty 1

## 2021-09-30 MED ORDER — LEVETIRACETAM 500 MG PO TABS
500.0000 mg | ORAL_TABLET | Freq: Two times a day (BID) | ORAL | Status: DC
  Administered 2021-09-30 – 2021-10-01 (×3): 500 mg via ORAL
  Filled 2021-09-30 (×3): qty 1

## 2021-09-30 MED ORDER — LOSARTAN POTASSIUM 50 MG PO TABS
100.0000 mg | ORAL_TABLET | Freq: Every day | ORAL | Status: DC
  Administered 2021-09-30 – 2021-10-01 (×2): 100 mg via ORAL
  Filled 2021-09-30 (×2): qty 2

## 2021-09-30 MED ORDER — NITROGLYCERIN 2 % TD OINT: 1.0000 [in_us] | TOPICAL_OINTMENT | Freq: Four times a day (QID) | TRANSDERMAL | Status: DC | PRN

## 2021-09-30 MED ORDER — FUROSEMIDE 40 MG PO TABS
40.0000 mg | ORAL_TABLET | Freq: Every day | ORAL | Status: DC
  Administered 2021-09-30 – 2021-10-01 (×2): 40 mg via ORAL
  Filled 2021-09-30 (×2): qty 1

## 2021-09-30 MED ORDER — NICOTINE 14 MG/24HR TD PT24: 14.0000 mg | MEDICATED_PATCH | Freq: Every day | TRANSDERMAL | Status: DC | PRN

## 2021-09-30 MED ORDER — GUAIFENESIN-DM 100-10 MG/5ML PO SYRP
5.0000 mL | ORAL_SOLUTION | ORAL | Status: DC | PRN
  Administered 2021-09-30: 5 mL via ORAL
  Filled 2021-09-30: qty 5

## 2021-09-30 MED ORDER — MONTELUKAST SODIUM 10 MG PO TABS
10.0000 mg | ORAL_TABLET | Freq: Every day | ORAL | Status: DC
  Administered 2021-09-30: 10 mg via ORAL
  Filled 2021-09-30: qty 1

## 2021-09-30 MED ORDER — ONDANSETRON HCL 4 MG PO TABS: 4.0000 mg | ORAL_TABLET | Freq: Four times a day (QID) | ORAL | Status: DC | PRN

## 2021-09-30 MED ORDER — ROSUVASTATIN CALCIUM 10 MG PO TABS
10.0000 mg | ORAL_TABLET | Freq: Every day | ORAL | Status: DC
  Administered 2021-09-30: 10 mg via ORAL
  Filled 2021-09-30: qty 1

## 2021-09-30 MED ORDER — ONDANSETRON HCL 4 MG/2ML IJ SOLN
4.0000 mg | Freq: Four times a day (QID) | INTRAMUSCULAR | Status: DC | PRN
  Administered 2021-09-30: 4 mg via INTRAVENOUS
  Filled 2021-09-30: qty 2

## 2021-09-30 MED ORDER — PERFLUTREN LIPID MICROSPHERE
1.0000 mL | INTRAVENOUS | Status: AC | PRN
  Administered 2021-09-30: 3 mL via INTRAVENOUS
  Filled 2021-09-30: qty 10

## 2021-09-30 MED ORDER — HEPARIN BOLUS VIA INFUSION
900.0000 [IU] | Freq: Once | INTRAVENOUS | Status: AC
  Administered 2021-09-30: 900 [IU] via INTRAVENOUS
  Filled 2021-09-30: qty 900

## 2021-09-30 MED ORDER — LACOSAMIDE 50 MG PO TABS
150.0000 mg | ORAL_TABLET | Freq: Two times a day (BID) | ORAL | Status: DC
Start: 2021-09-30 — End: 2021-10-01
  Administered 2021-09-30 – 2021-10-01 (×3): 150 mg via ORAL
  Filled 2021-09-30 (×3): qty 3

## 2021-09-30 MED ORDER — HEPARIN BOLUS VIA INFUSION
3900.0000 [IU] | Freq: Once | INTRAVENOUS | Status: AC
  Administered 2021-09-30: 3900 [IU] via INTRAVENOUS
  Filled 2021-09-30: qty 3900

## 2021-09-30 MED ORDER — CLOPIDOGREL BISULFATE 75 MG PO TABS
75.0000 mg | ORAL_TABLET | Freq: Every day | ORAL | Status: DC
  Administered 2021-09-30 – 2021-10-01 (×2): 75 mg via ORAL
  Filled 2021-09-30 (×2): qty 1

## 2021-09-30 MED ORDER — SPIRONOLACTONE 25 MG PO TABS
25.0000 mg | ORAL_TABLET | Freq: Every day | ORAL | Status: DC
  Administered 2021-09-30 – 2021-10-01 (×2): 25 mg via ORAL
  Filled 2021-09-30 (×2): qty 1

## 2021-09-30 MED ORDER — ALBUTEROL SULFATE (2.5 MG/3ML) 0.083% IN NEBU
2.5000 mg | INHALATION_SOLUTION | RESPIRATORY_TRACT | Status: DC | PRN
Start: 2021-09-30 — End: 2021-10-01
  Administered 2021-09-30: 2.5 mg via RESPIRATORY_TRACT
  Filled 2021-09-30: qty 3

## 2021-09-30 NOTE — Progress Notes (Signed)
ANTICOAGULATION CONSULT NOTE ? ?Pharmacy Consult for heparin infusion ?Indication: nSTEMI ? ?Allergies  ?Allergen Reactions  ? Aspirin Itching  ? Celebrex [Celecoxib] Itching  ?  itching  ? Ciprofloxacin Itching  ? Codeine Itching  ? Fosphenytoin Itching  ? Levaquin [Levofloxacin In D5w] Itching  ? Levofloxacin Itching  ? Lovastatin Itching  ? Penicillins   ?  Documentation indicates severe reaction ? ?Pt tolerated cephalosporin without adverse reaction 09/18 ?  ? Pravastatin Itching  ? Sulfa Antibiotics Itching  ? ? ?Patient Measurements: ?Height: _0  (160 cm) ?Weight: 65.8 kg (145 lb) ?IBW/kg (Calculated) : 52.4 ?Heparin Dosing Weight: 65.6 kg ? ?Vital Signs: ?Temp: 98 ?F (36.7 ?C) (03/27 0018) ?Temp Source: Oral (03/27 0018) ?BP: 169/68 (03/27 2549) ?Pulse Rate: 58 (03/27 1115) ? ?Labs: ?Recent Labs  ?  09/29/21 ?2136 09/30/21 ?0156 09/30/21 ?0208 09/30/21 ?8264 09/30/21 ?1028  ?HGB 14.1  --   --  13.8  --   ?HCT 44.3  --   --  43.4  --   ?PLT 216  --   --  221  --   ?APTT  --   --  31  --   --   ?LABPROT  --   --  14.0  --   --   ?INR  --   --  1.1  --   --   ?HEPARINUNFRC  --   --   --   --  0.28*  ?CREATININE 0.63  --   --  0.54  --   ?TROPONINIHS 158* 710*  --   --   --   ? ? ? ?Estimated Creatinine Clearance: 59.7 mL/min (by C-G formula based on SCr of 0.54 mg/dL). ? ? ?Medical History: ?Past Medical History:  ?Diagnosis Date  ? Asthma   ? CHF (congestive heart failure) ([place])   ? Cirrhosis, non-alcoholic ([place])   ? COPD (chronic obstructive pulmonary disease) ([place])   ? Deaf   ? Depression   ? Diabetes mellitus without complication ([place])   ? GERD (gastroesophageal reflux disease)   ? Heart murmur   ? Hepatitis   ? History of rheumatic fever   ? History of scarlet fever   ? Hypertension   ? IBS (irritable bowel syndrome)   ? Lymph node disorder   ? arm  ? Neuropathy   ? On home oxygen therapy   ? hs  ? Orthopnea   ? Osteoarthritis   ? RA (rheumatoid arthritis) ([place])   ? RLS (restless legs syndrome)   ?  Seizures ([place])   ? Shortness of breath dyspnea   ? Sleep apnea   ? Stroke [facility])   ? tia  ? ? ?Assessment: ?Pt is yo female brought in IVC by BPD due to suicidal ideation, found with elevated troponin I. Noted hx of DM, HTN, and stroke ? ?Goal of Therapy:  ?Heparin level 0.3-0.7 units/ml ?Monitor platelets by anticoagulation protocol: Yes ? ?Relevant lab results: ? ?Date/time aPTT/HL Rate   Comment ?3/27_1  HL 0.28 850 un/hr subtherapeutic ? ?  ?Plan:  ?Give heparin bolus of 900 units x 1 ?Increase heparin infusion rate to 950 units/hr ?Repeat HL in 8 hrs and daily once stable ?Follow up CBC daily while in heparin ? ?Denisse Whitenack Rodriguez-Guzman PharmD, BCPS ?09/30/2021 11:29 AM ? ? ?

## 2021-09-30 NOTE — Assessment & Plan Note (Signed)
-  Resumed home tiotropium 2 puffs daily, albuterol 2 puffs, inhalation every 4 hours.  For wheezing and shortness of breath ?

## 2021-09-30 NOTE — Assessment & Plan Note (Signed)
-  As needed nicotine patch ordered ?

## 2021-09-30 NOTE — ED Notes (Signed)
Attempted to get blood work on patient at this time. Pt's R arm is unable to use.  ?

## 2021-09-30 NOTE — Hospital Course (Signed)
Ms. [name] is a 71 year old femaleWith history of seizures, pulmonary emphysema, non-insulin-dependent diabetes mellitus, hypertension, hyperlipidemia, chronic headaches, restless leg syndrome, COPD, nicotine dependence, chronic diastolic heart failure, nonalcoholic cirrhosis, hearing loss, who presents emergency department for chief concerns of suicidal ideation. ? ?In the emergency department patient did endorse shortness of breath. ? ?Initial vitals in the ED showed temperature of 97.  5, respiration rate of 18, heart rate of 69, blood pressure 136/82, SPO2 of 99% on room air. ? ?Serum sodium 140, potassium 4.1, chloride 103, bicarb 25, BUN of 7, serum creatinine of 0.63, GFR greater than 60, nonfasting blood glucose 169, WBC 7.9, hemoglobin 14.1, platelets of 216. ? ?Hypertensive troponin was elevated at 158. ? ?Tylenol level was less than 10, salicylate acid less than 7, ethanol level less than 10. ? ?UDS ordered and pending. ? ?Patient is fully IVC. ? ?ED treatment: Acetaminophen 650 mg p.o. one-time dose. ?

## 2021-09-30 NOTE — Assessment & Plan Note (Signed)
-  Resumed home Keppra 500 mg p.o. twice daily, lacosamide 150 mg twice daily ?- Query medication compliance as patient states the last dose of these taken over the last week ?- Seizure precaution ?- Ativan 2 mg IV as needed for seizures, 2 doses ordered with instruction for RN to administer and then let provider know ?

## 2021-09-30 NOTE — Progress Notes (Signed)
Admission profile updated ?

## 2021-09-30 NOTE — ED Notes (Signed)
Pt has significant cough and phlegm, I don't see anything PRN for cough.  I did give her a neb tx.  She said that mucinex doesn't help and she does not take anything at home.  The phlegm had her choked up and she began vomiting, she said it happens every morning. [place] notified. ?

## 2021-09-30 NOTE — Progress Notes (Signed)
ANTICOAGULATION CONSULT NOTE ? ?Pharmacy Consult for heparin infusion ?Indication: nSTEMI ? ?Patient Measurements: ?Height: _0  (160 cm) ?Weight: 65.8 kg (145 lb) ?IBW/kg (Calculated) : 52.4 ?Heparin Dosing Weight: 65.6 kg ? ?Labs: ?Recent Labs  ?  09/29/21 ?2136 09/30/21 ?0156 09/30/21 ?0208 09/30/21 ?9528 09/30/21 ?1028 09/30/21 ?1922  ?HGB 14.1  --   --  13.8  --   --   ?HCT 44.3  --   --  43.4  --   --   ?PLT 216  --   --  221  --   --   ?APTT  --   --  31  --   --   --   ?LABPROT  --   --  14.0  --   --   --   ?INR  --   --  1.1  --   --   --   ?HEPARINUNFRC  --   --   --   --  0.28* 0.48  ?CREATININE 0.63  --   --  0.54  --   --   ?TROPONINIHS 158* 710*  --   --   --   --   ? ? ? ?Estimated Creatinine Clearance: 59.7 mL/min (by C-G formula based on SCr of 0.54 mg/dL). ? ? ?Medical History: ?Past Medical History:  ?Diagnosis Date  ? Asthma   ? CHF (congestive heart failure) ([place])   ? Cirrhosis, non-alcoholic ([place])   ? COPD (chronic obstructive pulmonary disease) ([place])   ? Deaf   ? Depression   ? Diabetes mellitus without complication ([place])   ? GERD (gastroesophageal reflux disease)   ? Heart murmur   ? Hepatitis   ? History of rheumatic fever   ? History of scarlet fever   ? Hypertension   ? IBS (irritable bowel syndrome)   ? Lymph node disorder   ? arm  ? Neuropathy   ? On home oxygen therapy   ? hs  ? Orthopnea   ? Osteoarthritis   ? RA (rheumatoid arthritis) ([place])   ? RLS (restless legs syndrome)   ? Seizures ([place])   ? Shortness of breath dyspnea   ? Sleep apnea   ? Stroke [facility])   ? tia  ? ? ?Assessment: ?Pt is yo female brought in IVC by BPD due to suicidal ideation, found with elevated troponin I. Noted hx of DM, HTN, and stroke ? ?Goal of Therapy:  ?Heparin level 0.3-0.7 units/ml ?Monitor platelets by anticoagulation protocol: Yes ? ?Relevant lab results: ? ?Date/time HL      Rate/comment ?3/27 1028 HL 0.28     850 u/hr; subtherapeutic ?3/27 1922 HL 0.48     950 u/hr; therapeutic x 1 ? ?  ?Plan:   ?Continue heparin infusion rate at 950 units/hr ?Repeat HL in 8 hrs to confirm ?Follow up CBC daily while in heparin ? ?Benita Gutter  ?09/30/2021 7:52 PM ? ? ?

## 2021-09-30 NOTE — Assessment & Plan Note (Signed)
-  Resumed home Plavix 75 mg daily again query medication compliance as patient reported that her last dose was last week ?- Resume rosuvastatin 10 mg nightly, metoprolol succinate 25 mg daily ?

## 2021-09-30 NOTE — Assessment & Plan Note (Addendum)
-  Etiology work-up in progress ?- Added BNP to prior lab collection and found to be elevated, complete echo ordered ?- Repeat high sensitive troponin ordered ?- Heparin GTT ?- Portable chest x-ray ordered stat ?- Nitroglycerin ointment every 6 hours as needed for chest pain, 3 doses ordered ?- Admit to telemetry cardiac, observation ?

## 2021-09-30 NOTE — ED Notes (Signed)
IVC prior to arrival/Psych Consult ordered/Pending No TTS/NP coverage ?

## 2021-09-30 NOTE — H&P (Addendum)
?History and Physical  ? ?[name] WGN:[id number] DOB: [birth year] DOA: 09/29/2021 ? ?PCP: [facility]  ?Patient coming from: Home via PD ? ?I have personally briefly reviewed patient's old medical records in [place]. ? ?Chief Concern: Suicidal ideation ? ?HPI: Ms. [name] is a 71 year old femaleWith history of seizures, pulmonary emphysema, non-insulin-dependent diabetes mellitus, hypertension, hyperlipidemia, chronic headaches, restless leg syndrome, COPD, nicotine dependence, chronic diastolic heart failure, nonalcoholic cirrhosis, hearing loss, who presents emergency department for chief concerns of suicidal ideation. ? ?In the emergency department patient did endorse shortness of breath. ? ?Initial vitals in the ED showed temperature of 97.  5, respiration rate of 18, heart rate of 69, blood pressure 136/82, SPO2 of 99% on room air. ? ?Serum sodium 140, potassium 4.1, chloride 103, bicarb 25, BUN of 7, serum creatinine of 0.63, GFR greater than 60, nonfasting blood glucose 169, WBC 7.9, hemoglobin 14.1, platelets of 216. ? ?Hypertensive troponin was elevated at 158. ? ?Tylenol level was less than 10, salicylate acid less than 7, ethanol level less than 10. ? ?UDS ordered and pending. ? ?Patient is fully IVC. ? ?ED treatment: Acetaminophen 650 mg p.o. one-time dose. ?-------- ?Patient is deaf and reads lips very well. ? ?At bedside patient is able to tell me her full name, her age, her current location and the current calendar year. ? ?She denies any suicidal, homicidal ideation. She states that her family irritate her and made her upset and she tried to go for a walk but they will not let her. They refused to let her have any social activity and will not let her go to church. They make her stay in her room all day. ? ?She states that over the last 2 days she has been having worsening shortness of breath and chest pressure. She states the chest pressure is a 3 out of 10  and denies any radiation to her neck, jaw, arms. ? ?She denies any fever, chills, new cough, sick contacts, abdominal pain, dysuria, hematuria.  She states she has chronic diarrhea and this has been going on for many years. ? ?When I asked her if she felt safe at home she pauses several moments and then says, 'I am okay.' ? ?After her endorsement of chest pressure, I asked if anyone is hurting her or hitting her.  She pauses several moments, then states, 'I am okay.' ? ?Social history: She currently lives with her son and his girlfriend and grandchild.  She denies EtOH, recreational drug use.  She states she currently smokes half a pack per day and would like to quit.  However her family situation causes her stress and therefore she smokes. ? ?Vaccination history: She is vaccinated for COVID-19 and influenza ?  ?ROS: ?Constitutional: no weight change, no fever ?ENT/Mouth: no sore throat, no rhinorrhea ?Eyes: no eye pain, no vision changes ?Cardiovascular: + chest pain, + dyspnea,  no edema, no palpitations ?Respiratory: no cough, no sputum, no wheezing ?Gastrointestinal: no nausea, no vomiting, no diarrhea, no constipation ?Genitourinary: no urinary incontinence, no dysuria, no hematuria ?Musculoskeletal: no arthralgias, no myalgias ?Skin: no skin lesions, no pruritus, ?Neuro: + weakness, no loss of consciousness, no syncope ?Psych: no anxiety, no depression, + decrease appetite ?Heme/Lymph: no bruising, no bleeding ? ?ED Course: Discussed with emergency medicine provider, patient requiring hospitalization for chief concerns of elevated troponin with complaints of shortness of breath. ? ?Assessment/Plan ? ?Principal Problem: ?  Elevated troponin ?Active Problems: ?  Essential hypertension ?  Seizure ([place]) ?  Depression ?  Chronic diastolic heart failure ([place]) ?  Tobacco use ?  COPD (chronic obstructive pulmonary disease) ([place]) ?  History of CVA in adulthood ?  Shortness of breath ?  ?Assessment and Plan: ? ?*  Elevated troponin ?- Etiology work-up in progress ?- Added BNP to prior lab collection and found to be elevated, complete echo ordered ?- Repeat high sensitive troponin ordered ?- Heparin GTT ?- Portable chest x-ray ordered stat ?- Nitroglycerin ointment every 6 hours as needed for chest pain, 3 doses ordered ?- Admit to telemetry cardiac, observation ? ?History of CVA in adulthood ?- Resumed home Plavix 75 mg daily again query medication compliance as patient reported that her last dose was last week ?- Resume rosuvastatin 10 mg nightly, metoprolol succinate 25 mg daily ? ?COPD (chronic obstructive pulmonary disease) ([place]) ?- Resumed home tiotropium 2 puffs daily, albuterol 2 puffs, inhalation every 4 hours.  For wheezing and shortness of breath ? ?Tobacco use ?- As needed nicotine patch ordered ? ?Chronic diastolic heart failure ([place]) ?- Resumed furosemide 40 mg daily, losartan 100 mg daily, metoprolol succinate 25 mg daily, spironolactone 25 mg daily ?- Appears euvolemic at this time and not in acute exacerbation ? ?Seizure ([place]) ?- Resumed home Keppra 500 mg p.o. twice daily, lacosamide 150 mg twice daily ?- Query medication compliance as patient states the last dose of these taken over the last week ?- Seizure precaution ?- Ativan 2 mg IV as needed for seizures, 2 doses ordered with instruction for RN to administer and then let provider know ? ?Chart reviewed.  ? ?DVT prophylaxis: Enoxaparin ?Code Status: DNR ?Diet: Heart healthy ?Family Communication: Daughter notes patient is here in fact daughter IVC patient ?Disposition Plan: Pending clinical course ?Consults called: None at this time ?Admission status: Telemetry cardiac, observation ? ?Past Medical History:  ?Diagnosis Date  ? Asthma   ? CHF (congestive heart failure) ([place])   ? Cirrhosis, non-alcoholic ([place])   ? COPD (chronic obstructive pulmonary disease) ([place])   ? Deaf   ? Depression   ? Diabetes mellitus without complication ([place])   ? GERD  (gastroesophageal reflux disease)   ? Heart murmur   ? Hepatitis   ? History of rheumatic fever   ? History of scarlet fever   ? Hypertension   ? IBS (irritable bowel syndrome)   ? Lymph node disorder   ? arm  ? Neuropathy   ? On home oxygen therapy   ? hs  ? Orthopnea   ? Osteoarthritis   ? RA (rheumatoid arthritis) ([place])   ? RLS (restless legs syndrome)   ? Seizures ([place])   ? Shortness of breath dyspnea   ? Sleep apnea   ? Stroke [facility])   ? tia  ? ?Past Surgical History:  ?Procedure Laterality Date  ? ABDOMINAL HYSTERECTOMY    ? BREAST BIOPSY Right 10/30/2020  ? Stereo Bx, X-clip, path pending   ? CATARACT EXTRACTION W/PHACO Right 11/23/2014  ? Procedure: CATARACT EXTRACTION PHACO AND INTRAOCULAR LENS PLACEMENT (IOC);  Surgeon: Lyla Glassing, MD;  Location: [facility] ORS;  Service: Ophthalmology;  Laterality: Right;  ? CATARACT EXTRACTION W/PHACO Left 12/14/2014  ? Procedure: CATARACT EXTRACTION PHACO AND INTRAOCULAR LENS PLACEMENT (IOC);  Surgeon: Lyla Glassing, MD;  Location: [facility] ORS;  Service: Ophthalmology;  Laterality: Left;  US:01:16.6 ?AP:15.8 ?CDE:12.14  ? CESAREAN SECTION    ? CHOLECYSTECTOMY    ? COLONOSCOPY WITH PROPOFOL N/A 10/08/2020  ? Procedure: COLONOSCOPY WITH PROPOFOL;  Surgeon: Toledo, Benay Pike, MD;  Location: [facility] ENDOSCOPY;  Service: Gastroenterology;  Laterality: N/A;  ? ESOPHAGOGASTRODUODENOSCOPY (EGD) WITH PROPOFOL N/A 10/08/2020  ? Procedure: ESOPHAGOGASTRODUODENOSCOPY (EGD) WITH PROPOFOL;  Surgeon: Toledo, Benay Pike, MD;  Location: [facility] ENDOSCOPY;  Service: Gastroenterology;  Laterality: N/A;  DM ?DEAF, NEEDS SIGN INTERPRETER PER SON  ? REVERSE SHOULDER ARTHROPLASTY Right 12/21/2019  ? Procedure: REVERSE SHOULDER ARTHROPLASTY;  Surgeon: Lovell Sheehan, MD;  Location: [facility] ORS;  Service: Orthopedics;  Laterality: Right;  ? THUMB ARTHROSCOPY    ? TONSILLECTOMY    ? TYMPANOPLASTY    ? muliple  ? ?Social History:  reports that she has been smoking cigarettes. She started smoking about 53 years ago.  She has a 26.00 pack-year smoking history. She has never used smokeless tobacco. She reports that she does not drink alcohol and does not use drugs. ? ?Allergies  ?Allergen Reactions  ? Aspirin Itching  ? Celebrex [Cele

## 2021-09-30 NOTE — ED Notes (Signed)
Pt coughing up phlegm and vomiting at this time. Pt is refusing nausea  medication offered by this RN.  States, "It will pass." ?

## 2021-09-30 NOTE — Assessment & Plan Note (Signed)
-  Resumed furosemide 40 mg daily, losartan 100 mg daily, metoprolol succinate 25 mg daily, spironolactone 25 mg daily ?- Appears euvolemic at this time and not in acute exacerbation ?

## 2021-09-30 NOTE — ED Notes (Signed)
Patient taken to restroom with one assist in room. Patient tolerated well other than O2 sat dropped to 89% on room air (states she only wears O2 at night at home). Patient placed back on 1.5L with O2 returning to 93% quickly. Patient denies any shortness of breath. Tylenol given as prn ordered for headache.  ?

## 2021-09-30 NOTE — ED Notes (Signed)
Pt states she does not want any visitors and that if her daughter she would not want [place] to update her and only update her son.  ?

## 2021-09-30 NOTE — TOC Initial Note (Signed)
Transition of Care (TOC) - Initial/Assessment Note  ? ? ?Patient Details  ?Name: [name] ?MRN: [id number] ?Date of Birth: 01-11-1951 ? ?Transition of Care (TOC) CM/SW Contact:    ?Shelbie Hutching, RN ?Phone Number: ?09/30/2021, 10:47 AM ? ?Clinical Narrative:                 ?Patient placed under observation for elevated troponin.  Patient was brought into the emergency room for depression and reporting that she did not want to live any longer.  Psych has been consulted.   ?RNCM met with patient at the bedside.  Patient is deaf but can read lips very well.  She reports that she lives with her son and she is ready to go home.   ?There is some concern about the relationships in the home but the patient would not talk with psychiatry about her home situation.  Patient reports to this RNCM that she does not want to hurt herself and that she was having a bad day yesterday. ?She reports that her family does not want her to walk and the farthest they will let her go is the front porch and she just wants to get out and see some things.   ? ?She wears oxygen at night 2L from [employer] .  Her family does not let her drive but she reports that she could.  She goes to [facility] for Pcp services and gets her prescriptions from [facility]. ? ?TOC will cont to follow for needs.   ? ?Expected Discharge Plan: Home/Self Care ?Barriers to Discharge: Continued Medical Work up ? ? ?Patient Goals and CMS Choice ?Patient states their goals for this hospitalization and ongoing recovery are:: Patient wants to go home- she does not want to hurt herself- she was having a bad day yesterday ?  ?  ? ?Expected Discharge Plan and Services ?Expected Discharge Plan: Home/Self Care ?  ?Discharge Planning Services: CM Consult ?  ?Living arrangements for the past 2 months: Apartment ?                ?DME Arranged: N/A ?DME Agency: NA ?  ?  ?  ?HH Arranged: NA ?[place] Agency: NA ?  ?  ?  ? ?Prior Living  Arrangements/Services ?Living arrangements for the past 2 months: Apartment ?Lives with:: Adult Children (son [name]) ?Patient language and need for interpreter reviewed:: Yes (sign language - can read lips) ?Do you feel safe going back to the place where you live?: Yes      ?Need for Family Participation in Patient Care: Yes (Comment) ?Care giver support system in place?: Yes (comment) (son) ?Current home services: DME (oxygen- [employer], cane, wheelchair) ?Criminal Activity/Legal Involvement Pertinent to Current Situation/Hospitalization: No - Comment as needed ? ?Activities of Daily Living ?Home Assistive Devices/Equipment: Eyeglasses, Kasandra Knudsen (specify quad or straight), Walker (specify type), Wheelchair ?ADL Screening (condition at time of admission) ?Patient's cognitive ability adequate to safely complete daily activities?: Yes ?Is the patient deaf or have difficulty hearing?: Yes ?Does the patient have difficulty seeing, even when wearing glasses/contacts?: No ?Does the patient have difficulty concentrating, remembering, or making decisions?: No ?Patient able to express need for assistance with ADLs?: Yes ?Does the patient have difficulty dressing or bathing?: No ?Independently performs ADLs?: Yes (appropriate for developmental age) ?Does the patient have difficulty walking or climbing stairs?: No ?Weakness of Legs: None ?Weakness of Arms/Hands: None ? ?Permission Sought/Granted ?Permission sought to share information with : Case Manager, Family  Supports ?Permission granted to share information with : Yes, Verbal Permission Granted ? Share Information with NAME: [name] ?   ? Permission granted to share info w Relationship: son ? Permission granted to share info w Contact Information: [phone] ? ?Emotional Assessment ?Appearance:: Appears stated age ?Attitude/Demeanor/Rapport: Engaged ?Affect (typically observed): Accepting ?Orientation: : Oriented to Self, Oriented to Place, Oriented to  Time, Oriented to  Situation ?Alcohol / Substance Use: Not Applicable ?Psych Involvement: Yes (comment) ? ?Admission diagnosis:  Shortness of breath [R06.02] ?Patient Active Problem List  ? Diagnosis Date Noted  ? Shortness of breath 09/30/2021  ? Elevated troponin 09/30/2021  ? Uncontrolled hypertension 05/01/2021  ? Type 2 diabetes mellitus ([place]) 05/01/2021  ? CHF (congestive heart failure) ([place]) 05/01/2021  ? Breakthrough seizure ([place]) 04/28/2021  ? Aspiration pneumonia ([place]) 07/29/2020  ? S/P reverse total shoulder arthroplasty, right 12/21/2019  ? Comminuted fracture of right humerus 08/05/2019  ? History of CVA in adulthood 08/05/2019  ? COPD (chronic obstructive pulmonary disease) ([place])   ? Deaf   ? On home oxygen therapy   ? Cirrhosis, non-alcoholic ([place])   ? Asthma   ? Fall   ? Closed comminuted fracture of right humerus   ? Hypomagnesemia   ? PNA (pneumonia) 03/01/2018  ? Bronchitis 09/23/2017  ? Chronic diastolic heart failure ([place]) 08/26/2017  ? Tobacco use 08/26/2017  ? Seizure disorder ([place]) 06/15/2017  ? Goals of care, counseling/discussion 05/06/2017  ? Status epilepticus ([place])   ? CAP (community acquired pneumonia)   ? Hypoxia   ? Hypokalemia 02/12/2017  ? GERD (gastroesophageal reflux disease) 03/23/2016  ? Depression 03/23/2016  ? Diabetes mellitus without complication ([place]) 09/81/1914  ? Essential hypertension 03/22/2016  ? Neuropathy 03/22/2016  ? Seizure ([place]) 03/22/2016  ? COPD exacerbation ([place]) 03/31/2015  ? ?PCP:  [facility] ?Pharmacy:   ?[place] ?[place] [address] ?Phone: [phone] Fax: [phone] ? ?[place] [address] ?Phone: [phone] Fax: [phone] ? ? ? ? ?Social Determinants of Health (SDOH) Interventions ?  ? ?Readmission Risk Interventions ?   ? View : No data to display.  ?  ?  ?  ? ? ? ?

## 2021-09-30 NOTE — Progress Notes (Signed)
Patient arrived from ED via stretcher approx 1500. Stand by assist to bed, patient steady independently and assistance needed for IV. Heparin gtt infusing @ 950u/hr. Patient offered video interpreter, patient states she would rather read lips. Informed patient that if she wanted the interpreter at any time we could use an interpreter. Patient educated on call bell, need for assistance before ambulating.Patient verbalized understanding. Patient denies SI at this time, however does state she was frustrated with her family and only wishes her son, [name] to visit/be updated. Patient without any questions or concerns at this time.  ?

## 2021-09-30 NOTE — ED Notes (Signed)
Pt states that if her family comes to visit, she does not want to see them. ?

## 2021-09-30 NOTE — ED Notes (Signed)
Recsinded IVC by Waylan Boga NP psych.   ?

## 2021-09-30 NOTE — ED Notes (Signed)
Call C. Dorothyann Peng [phone] [facility] for transport upon d/c. ?

## 2021-09-30 NOTE — ED Notes (Signed)
Spoke with pharmacy regarding pt Spiriva.  ?

## 2021-09-30 NOTE — Care Management Obs Status (Signed)
MEDICARE OBSERVATION STATUS NOTIFICATION ? ? ?Patient Details  ?Name: [name] ?MRN: [id number] ?Date of Birth: [birth year] ? ? ?Medicare Observation Status Notification Given:  Yes ? ? ? ?Shelbie Hutching, RN ?09/30/2021, 10:32 AM ?

## 2021-09-30 NOTE — Progress Notes (Signed)
ANTICOAGULATION CONSULT NOTE ? ?Pharmacy Consult for heparin infusion ?Indication: nSTEMI ? ?Allergies  ?Allergen Reactions  ? Aspirin Itching  ? Celebrex [Celecoxib] Itching  ?  itching  ? Ciprofloxacin Itching  ? Codeine Itching  ? Fosphenytoin Itching  ? Levaquin [Levofloxacin In D5w] Itching  ? Levofloxacin Itching  ? Lovastatin Itching  ? Penicillins   ?  Documentation indicates severe reaction ? ?Pt tolerated cephalosporin without adverse reaction 09/18 ?  ? Pravastatin Itching  ? Sulfa Antibiotics Itching  ? ? ?Patient Measurements: ?Height: _0  (160 cm) ?Weight: 65.8 kg (145 lb) ?IBW/kg (Calculated) : 52.4 ?Heparin Dosing Weight: 65.6 kg ? ?Vital Signs: ?Temp: 98 ?F (36.7 ?C) (03/27 0018) ?Temp Source: Oral (03/27 0018) ?BP: 159/67 (03/27 0018) ?Pulse Rate: 59 (03/27 0018) ? ?Labs: ?Recent Labs  ?  09/29/21 ?2136  ?HGB 14.1  ?HCT 44.3  ?PLT 216  ?CREATININE 0.63  ?TROPONINIHS 158*  ? ? ?Estimated Creatinine Clearance: 59.7 mL/min (by C-G formula based on SCr of 0.63 mg/dL). ? ? ?Medical History: ?Past Medical History:  ?Diagnosis Date  ? Asthma   ? CHF (congestive heart failure) ([place])   ? Cirrhosis, non-alcoholic ([place])   ? COPD (chronic obstructive pulmonary disease) ([place])   ? Deaf   ? Depression   ? Diabetes mellitus without complication ([place])   ? GERD (gastroesophageal reflux disease)   ? Heart murmur   ? Hepatitis   ? History of rheumatic fever   ? History of scarlet fever   ? Hypertension   ? IBS (irritable bowel syndrome)   ? Lymph node disorder   ? arm  ? Neuropathy   ? On home oxygen therapy   ? hs  ? Orthopnea   ? Osteoarthritis   ? RA (rheumatoid arthritis) ([place])   ? RLS (restless legs syndrome)   ? Seizures ([place])   ? Shortness of breath dyspnea   ? Sleep apnea   ? Stroke [facility])   ? tia  ? ? ?Assessment: ?Pt is yo female brought in IVC by [facility] due to SI, found with elevated troponin I. ? ?Goal of Therapy:  ?Heparin level 0.3-0.7 units/ml ?Monitor platelets by anticoagulation protocol: Yes ?  ?Plan:   ?Bolus 3900 units x 1 ?Start heparin infusion at 850 units/hr ?Check HL in 8 hrs after start of infusion ?CBC daily while on heparin ? ?Renda Rolls, PharmD, MBA ?09/30/2021 ?1:46 AM ? ? ? ?

## 2021-09-30 NOTE — ED Notes (Signed)
Pt states that she is frustrated with her family and '6 hours ago' did not want to live anymore,  denies SI at this time. States that she feels 'better.' ?

## 2021-09-30 NOTE — ED Notes (Signed)
Pt repositioned in the bed at this time to eat breakfast.  ?

## 2021-09-30 NOTE — ED Notes (Signed)
Trop 158 per lab, [place] notified. ?

## 2021-09-30 NOTE — Consult Note (Addendum)
[facility] Face-to-Face Psychiatry Consult  ? ?Reason for Consult:  negative comment about wishing she was dead when she got upset with her family ?Referring Physician:  EDP ?Patient Identification: [name] ?MRN:  [id number] ?Principal Diagnosis: Stress reaction causing mixed disturbance of emotion and conduct ?Diagnosis:  Principal Problem: ?  Stress reaction causing mixed disturbance of emotion and conduct ?Active Problems: ?  Essential hypertension ?  Seizure ([place]) ?  Depression ?  Chronic diastolic heart failure ([place]) ?  Tobacco use ?  COPD (chronic obstructive pulmonary disease) ([place]) ?  History of CVA in adulthood ?  Shortness of breath ?  Elevated troponin ? ? ?Total Time spent with patient: 45 minutes ? ?Subjective:   ?[name] is a 71 y.o. female patient admitted after an altercation with her family and then saying something she did not mean of "I wish I was dead." ? ?HPI:  71 yo female presented to the ED after getting upset with her family and commenting she wish she was dead.  On assessment, she is calm and cooperative, states, "I was upset yesterday with my family.  I just said, I wish I was dead," which she denies meaning.  Denies current suicidal ideations or past attempts, admitted to inpatient mental health services for depression in 1999.  Mild depression and mild to moderate anxiety, no panic attacks.  She is living with her son since her husband passed in 67 and his girlfriend and 43 yo son.  She is frustrated that she feels she cannot leave the home and yesterday got the 70 yo to take her to [place] to shop and enjoy herself.  Her family got upset about her walking back by herself despite the apartments being behind [place].  Denies homicidal ideations, hallucinations, substance abuse.  When asked about abuse, she stated, "I'm not going to answer that."  TOC consult placed to report this.  Psychiatrically stable. ? ?Past Psychiatric History: depression, anxiety ? ?Risk to Self:   none ?Risk to Others: none  ?Prior Inpatient Therapy:  1999 ?Prior Outpatient Therapy:  PCP ? ?Past Medical History:  ?Past Medical History:  ?Diagnosis Date  ? Asthma   ? CHF (congestive heart failure) ([place])   ? Cirrhosis, non-alcoholic ([place])   ? COPD (chronic obstructive pulmonary disease) ([place])   ? Deaf   ? Depression   ? Diabetes mellitus without complication ([place])   ? GERD (gastroesophageal reflux disease)   ? Heart murmur   ? Hepatitis   ? History of rheumatic fever   ? History of scarlet fever   ? Hypertension   ? IBS (irritable bowel syndrome)   ? Lymph node disorder   ? arm  ? Neuropathy   ? On home oxygen therapy   ? hs  ? Orthopnea   ? Osteoarthritis   ? RA (rheumatoid arthritis) ([place])   ? RLS (restless legs syndrome)   ? Seizures ([place])   ? Shortness of breath dyspnea   ? Sleep apnea   ? Stroke [facility])   ? tia  ?  ?Past Surgical History:  ?Procedure Laterality Date  ? ABDOMINAL HYSTERECTOMY    ? BREAST BIOPSY Right 10/30/2020  ? Stereo Bx, X-clip, path pending   ? CATARACT EXTRACTION W/PHACO Right 11/23/2014  ? Procedure: CATARACT EXTRACTION PHACO AND INTRAOCULAR LENS PLACEMENT (IOC);  Surgeon: Lyla Glassing, MD;  Location: [facility] ORS;  Service: Ophthalmology;  Laterality: Right;  ? CATARACT EXTRACTION W/PHACO Left 12/14/2014  ? Procedure: CATARACT EXTRACTION PHACO AND INTRAOCULAR  LENS PLACEMENT (IOC);  Surgeon: Lyla Glassing, MD;  Location: [facility] ORS;  Service: Ophthalmology;  Laterality: Left;  US:01:16.6 ?AP:15.8 ?CDE:12.14  ? CESAREAN SECTION    ? CHOLECYSTECTOMY    ? COLONOSCOPY WITH PROPOFOL N/A 10/08/2020  ? Procedure: COLONOSCOPY WITH PROPOFOL;  Surgeon: Toledo, Benay Pike, MD;  Location: [facility] ENDOSCOPY;  Service: Gastroenterology;  Laterality: N/A;  ? ESOPHAGOGASTRODUODENOSCOPY (EGD) WITH PROPOFOL N/A 10/08/2020  ? Procedure: ESOPHAGOGASTRODUODENOSCOPY (EGD) WITH PROPOFOL;  Surgeon: Toledo, Benay Pike, MD;  Location: [facility] ENDOSCOPY;  Service: Gastroenterology;  Laterality: N/A;  DM ?DEAF, NEEDS SIGN  INTERPRETER PER SON  ? REVERSE SHOULDER ARTHROPLASTY Right 12/21/2019  ? Procedure: REVERSE SHOULDER ARTHROPLASTY;  Surgeon: Lovell Sheehan, MD;  Location: [facility] ORS;  Service: Orthopedics;  Laterality: Right;  ? THUMB ARTHROSCOPY    ? TONSILLECTOMY    ? TYMPANOPLASTY    ? muliple  ? ?Family History:  ?Family History  ?Problem Relation Age of Onset  ? Lung cancer Mother   ? CAD Father   ? Breast cancer Sister   ? ?Family Psychiatric  History: see above ?Social History:  ?Social History  ? ?Substance and Sexual Activity  ?Alcohol Use No  ?   ?Social History  ? ?Substance and Sexual Activity  ?Drug Use No  ?  ?Social History  ? ?Socioeconomic History  ? Marital status: Widowed  ?  Spouse name: Not on file  ? Number of children: Not on file  ? Years of education: Not on file  ? Highest education level: Not on file  ?Occupational History  ? Not on file  ?Tobacco Use  ? Smoking status: Every Day  ?  Packs/day: 0.50  ?  Years: 52.00  ?  Pack years: 26.00  ?  Types: Cigarettes  ?  Start date: 03/01/1968  ? Smokeless tobacco: Never  ?Vaping Use  ? Vaping Use: Former  ?Substance and Sexual Activity  ? Alcohol use: No  ? Drug use: No  ? Sexual activity: Not Currently  ?Other Topics Concern  ? Not on file  ?Social History Narrative  ? Pt lives in 1 story home with her son, his girlfriend and a family friend  ? Has 2 adult children  ? 9th grade education  ? 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   ? ?Social Determinants of Health  ? ?Financial Resource Strain: Not on file  ?Food Insecurity: Not on file  ?Transportation Needs: Not on file  ?Physical Activity: Not on file  ?Stress: Not on file  ?Social Connections: Not on file  ? ?Additional Social History: ?  ? ?Allergies:   ?Allergies  ?Allergen Reactions  ? Aspirin Itching  ? Celebrex [Celecoxib] Itching  ?  itching  ? Ciprofloxacin Itching  ? Codeine Itching  ? Fosphenytoin Itching  ? Levaquin [Levofloxacin In D5w] Itching  ? Levofloxacin Itching  ?  Lovastatin Itching  ? Penicillins   ?  Documentation indicates severe reaction ? ?Pt tolerated cephalosporin without adverse reaction 09/18 ?  ? Pravastatin Itching  ? Sulfa Antibiotics Itching  ? ? ?Labs:  ?Results for orders placed or performed during the hospital encounter of 09/29/21 (from the past 48 hour(s))  ?Comprehensive metabolic panel     Status: Abnormal  ? Collection Time: 09/29/21  9:36 PM  ?Result Value Ref Range  ? Sodium 140 135 - 145 mmol/L  ? Potassium 4.1 3.5 - 5.1 mmol/L  ? Chloride 103 98 - 111 mmol/L  ? CO2 25  22 - 32 mmol/L  ? Glucose, Bld 169 (H) 70 - 99 mg/dL  ?  Comment: Glucose reference range applies only to samples taken after fasting for at least 8 hours.  ? BUN 7 (L) 8 - 23 mg/dL  ? Creatinine, Ser 0.63 0.44 - 1.00 mg/dL  ? Calcium 9.4 8.9 - 10.3 mg/dL  ? Total Protein 7.7 6.5 - 8.1 g/dL  ? Albumin 4.0 3.5 - 5.0 g/dL  ? AST 24 15 - 41 U/L  ? ALT 20 0 - 44 U/L  ? Alkaline Phosphatase 99 38 - 126 U/L  ? Total Bilirubin 0.6 0.3 - 1.2 mg/dL  ? GFR, Estimated >60 >60 mL/min  ?  Comment: (NOTE) ?Calculated using the CKD-EPI Creatinine Equation (2021) ?  ? Anion gap 12 5 - 15  ?  Comment: Performed at [facility], [address], [place], [place] [address]  ?Ethanol     Status: None  ? Collection Time: 09/29/21  9:36 PM  ?Result Value Ref Range  ? Alcohol, Ethyl (B) <10 <10 mg/dL  ?  Comment: (NOTE) ?Lowest detectable limit for serum alcohol is 10 mg/dL. ? ?For medical purposes only. ?Performed at [facility], [place], ?[place] [address] ?  ?Salicylate level     Status: Abnormal  ? Collection Time: 09/29/21  9:36 PM  ?Result Value Ref Range  ? Salicylate Lvl <8.4 (L) 7.0 - 30.0 mg/dL  ?  Comment: Performed at [facility], [address] [place],  [address]  ?Acetaminophen level     Status: Abnormal  ? Collection Time: 09/29/21  9:36 PM  ?Result Value Ref Range  ? Acetaminophen (Tylenol), Serum <10 (L) 10 - 30 ug/mL  ?  Comment:  (NOTE) ?Therapeutic concentrations vary significantly. A range of 10-30 ug/mL  ?may be an effective concentration for many patients. However, some  ?are best treated at concentrations outside of this range. ?Acetaminophen

## 2021-09-30 NOTE — Progress Notes (Signed)
?PROGRESS NOTE ? ? ? ?[name]  STM:[id number] DOB: 30-May-1951 DOA: 09/29/2021 ?PCP: [facility]  ? ?Assessment & Plan: ?  ?Principal Problem: ?  Elevated troponin ?Active Problems: ?  Essential hypertension ?  Seizure ([place]) ?  Depression ?  Chronic diastolic heart failure ([place]) ?  Tobacco use ?  COPD (chronic obstructive pulmonary disease) ([place]) ?  History of CVA in adulthood ?  Shortness of breath ? ? ?Elevated troponin: etiology unclear. Continue on IV heparin.  Pt denies any chest pain or shortness of breath. Continue on tele. Echo ordered. Cardio consulted  ?  ?Hx of CVA: continue on plaxix, statin  ?  ?COPD: w/o exacerbation. Continue on bronchodilators  ?  ?Tobacco use: nicotine patch to prevent w/drawal. Smoking cessation counseling  ?  ?Chronic diastolic heart failure: appears compensated. Continue on lasix, metoprolol, losartan, aldactone. Monitor I/Os  ?  ?Seizure: continue on home dose of keppra, lacosamide. Ativan prn  ? ?Possible suicidal ideation: no risk of harm to self or others as per psych. IVC was lifted as per psych  ? ? ? ?DVT prophylaxis: heparin drip  ?Code Status: DNR ?Family Communication: ?Disposition Plan: likely d/c back home  ? ?Level of care: Telemetry Cardiac ? ?Status is: Observation ?The patient remains OBS appropriate and will d/c before 2 midnights. ? ? ?Consultants:  ?Cardio  ? ?Procedures:  ? ?Antimicrobials: ? ? ?Subjective: ?Pt c/o fatigue  ? ?Objective: ?Vitals:  ? 09/30/21 0018 09/30/21 0400 09/30/21 0500 09/30/21 0821  ?BP: (!) 159/67 (!) 138/55 (!) 142/59 (!) 169/68  ?Pulse: (!) 59 (!) 50 (!) 50 (!) 55  ?Resp: _0 ?Temp: 98 ?F (36.7 ?C)     ?TempSrc: Oral     ?SpO2: 95% 97% 96% 95%  ?Weight:      ?Height:      ? ?No intake or output data in the 24 hours ending 09/30/21 0839 ?Filed Weights  ? 09/29/21 2134  ?Weight: 65.8 kg  ? ? ?Examination: ? ?General exam: Appears calm and comfortable  ?Respiratory system: Clear to auscultation.  Respiratory effort normal. ?Cardiovascular system: S1 & S2 +. No  rubs, gallops or clicks. No pedal edema. ?Gastrointestinal system: Abdomen is nondistended, soft and nontender. Normal bowel sounds heard. ?Central nervous system: Alert and oriented. Moves all extremities  ?Psychiatry: Judgement and insight appear normal. Mood & affect appropriate.  ? ? ? ?Data Reviewed: I have personally reviewed following labs and imaging studies ? ?CBC: ?Recent Labs  ?Lab 09/29/21 ?2136 09/30/21 ?8921  ?WBC 7.9 9.5  ?HGB 14.1 13.8  ?HCT 44.3 43.4  ?MCV 83.4 82.7  ?PLT 216 221  ? ?Basic Metabolic Panel: ?Recent Labs  ?Lab 09/29/21 ?2136 09/30/21 ?1941  ?NA 140 140  ?K 4.1 4.2  ?CL 103 106  ?CO2 25 23  ?GLUCOSE 169* 118*  ?BUN 7* 7*  ?CREATININE 0.63 0.54  ?CALCIUM 9.4 9.3  ? ?GFR: ?Estimated Creatinine Clearance: 59.7 mL/min (by C-G formula based on SCr of 0.54 mg/dL). ?Liver Function Tests: ?Recent Labs  ?Lab 09/29/21 ?2136  ?AST 24  ?ALT 20  ?ALKPHOS 99  ?BILITOT 0.6  ?PROT 7.7  ?ALBUMIN 4.0  ? ?No results for input(s): LIPASE, AMYLASE in the last 168 hours. ?No results for input(s): AMMONIA in the last 168 hours. ?Coagulation Profile: ?Recent Labs  ?Lab 09/30/21 ?0208  ?INR 1.1  ? ?Cardiac Enzymes: ?No results for input(s): CKTOTAL, CKMB, CKMBINDEX, TROPONINI in the last 168 hours. ?BNP (last 3 results) ?  No results for input(s): PROBNP in the last 8760 hours. ?HbA1C: ?No results for input(s): HGBA1C in the last 72 hours. ?CBG: ?No results for input(s): GLUCAP in the last 168 hours. ?Lipid Profile: ?No results for input(s): CHOL, HDL, LDLCALC, TRIG, CHOLHDL, LDLDIRECT in the last 72 hours. ?Thyroid Function Tests: ?No results for input(s): TSH, T4TOTAL, FREET4, T3FREE, THYROIDAB in the last 72 hours. ?Anemia Panel: ?No results for input(s): VITAMINB12, FOLATE, FERRITIN, TIBC, IRON, RETICCTPCT in the last 72 hours. ?Sepsis Labs: ?No results for input(s): PROCALCITON, LATICACIDVEN in the last 168 hours. ? ?No results found for this  or any previous visit (from the past 240 hour(s)).  ? ? ? ? ? ?Radiology Studies: ?DG Chest Port 1 View ? ?Result Date: 09/30/2021 ?CLINICAL DATA:  Suicidal thoughts and elevated troponin. EXAM: PORTABLE CHEST 1 VIEW COMPARISON:  May 02, 2021 FINDINGS: The heart size and mediastinal contours are within normal limits. Marked severity calcification of the aortic arch is noted. The lungs are hyperinflated. Both lungs are clear. An intact right shoulder replacement is seen. The visualized skeletal structures are otherwise unremarkable. IMPRESSION: No active cardiopulmonary disease. Electronically Signed   By: Virgina Norfolk M.D.   On: 09/30/2021 01:14   ? ? ? ? ? ?Scheduled Meds: ? citalopram  10 mg Oral Daily  ? clopidogrel  75 mg Oral Daily  ? fluticasone  1 spray Each Nare BID  ? furosemide  40 mg Oral Daily  ? gabapentin  100 mg Oral BID  ? lacosamide  150 mg Oral BID  ? levETIRAcetam  500 mg Oral BID  ? losartan  100 mg Oral Daily  ? metoprolol succinate  25 mg Oral Daily  ? montelukast  10 mg Oral QHS  ? pantoprazole  40 mg Oral Daily  ? rOPINIRole  0.5 mg Oral QHS  ? rosuvastatin  10 mg Oral QHS  ? spironolactone  25 mg Oral Daily  ? tiotropium  18 mcg Inhalation Q1200  ? ?Continuous Infusions: ? heparin 850 Units/hr (09/30/21 0214)  ? ? ? LOS: 0 days  ? ? ?Time spent: 33 mins  ? ? ? ?Wyvonnia Dusky, MD ?[facility] ?Pager [phone] ? ?If 7PM-7AM, please contact night-coverage ?09/30/2021, 8:39 AM  ? ?

## 2021-10-01 ENCOUNTER — Observation Stay: Payer: Medicare Other

## 2021-10-01 DIAGNOSIS — R778 Other specified abnormalities of plasma proteins: Secondary | ICD-10-CM | POA: Diagnosis not present

## 2021-10-01 DIAGNOSIS — I5032 Chronic diastolic (congestive) heart failure: Secondary | ICD-10-CM

## 2021-10-01 DIAGNOSIS — R569 Unspecified convulsions: Secondary | ICD-10-CM | POA: Diagnosis not present

## 2021-10-01 LAB — CBC
HCT: 41.3 % (ref 36.0–46.0)
Hemoglobin: 13.1 g/dL (ref 12.0–15.0)
MCH: 26.2 pg (ref 26.0–34.0)
MCHC: 31.7 g/dL (ref 30.0–36.0)
MCV: 82.6 fL (ref 80.0–100.0)
Platelets: 183 10*3/uL (ref 150–400)
RBC: 5 MIL/uL (ref 3.87–5.11)
RDW: 15.4 % (ref 11.5–15.5)
WBC: 5.5 10*3/uL (ref 4.0–10.5)
nRBC: 0 % (ref 0.0–0.2)

## 2021-10-01 LAB — BASIC METABOLIC PANEL
Anion gap: 10 (ref 5–15)
BUN: 10 mg/dL (ref 8–23)
CO2: 27 mmol/L (ref 22–32)
Calcium: 8.9 mg/dL (ref 8.9–10.3)
Chloride: 101 mmol/L (ref 98–111)
Creatinine, Ser: 0.53 mg/dL (ref 0.44–1.00)
GFR, Estimated: >60 mL/min (ref >60)
Glucose, Bld: 129 mg/dL — ABNORMAL HIGH (ref 70–99)
Potassium: 3.4 mmol/L — ABNORMAL LOW (ref 3.5–5.1)
Sodium: 138 mmol/L (ref 135–145)

## 2021-10-01 LAB — HEPARIN LEVEL (UNFRACTIONATED): Heparin Unfractionated: 0.44 IU/mL (ref 0.30–0.70)

## 2021-10-01 LAB — TROPONIN I (HIGH SENSITIVITY): Troponin I (High Sensitivity): 379 ng/L (ref <18)

## 2021-10-01 MED ORDER — MECLIZINE HCL 50 MG PO TABS
25.0000 mg | ORAL_TABLET | Freq: Two times a day (BID) | ORAL | 0 refills | Status: AC | PRN
Start: 2021-10-01 — End: 2021-10-06

## 2021-10-01 MED ORDER — POTASSIUM CHLORIDE CRYS ER 20 MEQ PO TBCR
20.0000 meq | EXTENDED_RELEASE_TABLET | Freq: Once | ORAL | Status: AC
  Administered 2021-10-01: 20 meq via ORAL
  Filled 2021-10-01: qty 1

## 2021-10-01 NOTE — Progress Notes (Signed)
Patient resting in bed, ambulating to bathroom independently without issues. Patient provided coffee prior to breakfast and then ate breakfast with no complaints. Approx 1000 am, cardiology PA rounding on patient when she complains of sudden dizziness and blurred vision after returning to bed. Patient states this has never happened before, no apparent weakness however patient continues to remain dizzy and "feels like her eyes wont stay still." Heparin gtt held per cardiology PA. PA and MD bedside, orthostatic vital signs obtained and documented. Orders placed by MD. Patient returned to bed with no other complaints. Patient returned from MRI, requesting to go home. Discussed safety and vision/dizziness which patient continues to endorse, however patient states she feels she can go home safely and will follow up with an eye doctor as recommended by MD. MD made aware, orders to be placed. Patient states son will transport home when patient is ready to go home. DC instructions and new rx given to patient and IV removed without issue. Patient requesting to dress self. Call placed to son x2 with no answer, patient told charge RN ok to call daughter. Able to speak with son who will come get patient, patient wishes to wait downstairs for son to arrive.   ?

## 2021-10-01 NOTE — Consult Note (Signed)
?[facility] CARDIOLOGY CONSULT NOTE  ? ?    ?Patient ID: ?[name] ?MRN: [id number] ?DOB/AGE: Jun 25, 1951 71 y.o. ? ?Admit date: 09/29/2021 ?Referring Physician Dr. Jimmye Norman ?Primary Physician  ?Primary Cardiologist Dr. Nehemiah Massed ?Reason for Consultation elevated troponin ? ?HPI: The patient is a 71 year old deaf female with a past medical history of HFpEF (LVEF 55-60% 09/30/2021), mild to moderate AS, bilateral carotid artery stenosis, COPD, history of seizures, hypertension, hyperlipidemia, type 2 diabetes, OSA on 2 L nightly, tobacco use who presented to [facility] ED with suicidal ideation.  She was under IVC until seen by psychiatry on 09/30/2021 where she was not found to be a danger to herself or others.  Cardiology is consulted because of her elevated troponin. ? ?The patient declined use of ASL interpreter and preferred to read lips during this encounter.  She states she was very upset with her family, stating yesterday she was at [facility] with her son and her son's girlfriend and she went to walk, as the apartment is reportedly right behind the store.  When they got home they "fussed at her" and she got upset, reportedly stating that she wanted to hurt herself but did not have a plan to do so.  Today during interview she denies ever having pain in her chest, denies palpitations, syncope, lower extremity edema.  She has some chronic exertional dyspnea that is not any worse than usual, and has a chronic cough.  She currently has some dizziness and double vision that started after she got back into bed after using the restroom. ? ?Labs are notable for potassium of 3.4, creatinine 0.53 EGFR greater than 60.  High-sensitivity troponin trended [id number], BNP 192.  H&H 13/41.  ?Blood pressure on admission was elevated to a peak of 172/60 3 in the morning during interview 117/68 with heart rate of 64. ?Chest x-ray is negative for active cardiopulmonary disease. ? ?Review of systems complete and found to be  negative unless listed above  ? ? ? ?Past Medical History:  ?Diagnosis Date  ? Asthma   ? CHF (congestive heart failure) ([place])   ? Cirrhosis, non-alcoholic ([place])   ? COPD (chronic obstructive pulmonary disease) ([place])   ? Deaf   ? Depression   ? Diabetes mellitus without complication ([place])   ? GERD (gastroesophageal reflux disease)   ? Heart murmur   ? Hepatitis   ? History of rheumatic fever   ? History of scarlet fever   ? Hypertension   ? IBS (irritable bowel syndrome)   ? Lymph node disorder   ? arm  ? Neuropathy   ? On home oxygen therapy   ? hs  ? Orthopnea   ? Osteoarthritis   ? RA (rheumatoid arthritis) ([place])   ? RLS (restless legs syndrome)   ? Seizures ([place])   ? Shortness of breath dyspnea   ? Sleep apnea   ? Stroke [facility])   ? tia  ?  ?Past Surgical History:  ?Procedure Laterality Date  ? ABDOMINAL HYSTERECTOMY    ? BREAST BIOPSY Right 10/30/2020  ? Stereo Bx, X-clip, path pending   ? CATARACT EXTRACTION W/PHACO Right 11/23/2014  ? Procedure: CATARACT EXTRACTION PHACO AND INTRAOCULAR LENS PLACEMENT (IOC);  Surgeon: Lyla Glassing, MD;  Location: [facility] ORS;  Service: Ophthalmology;  Laterality: Right;  ? CATARACT EXTRACTION W/PHACO Left 12/14/2014  ? Procedure: CATARACT EXTRACTION PHACO AND INTRAOCULAR LENS PLACEMENT (IOC);  Surgeon: Lyla Glassing, MD;  Location: [facility] ORS;  Service: Ophthalmology;  Laterality: Left;  US:01:16.6 ?AP:15.8 ?  CDE:12.14  ? CESAREAN SECTION    ? CHOLECYSTECTOMY    ? COLONOSCOPY WITH PROPOFOL N/A 10/08/2020  ? Procedure: COLONOSCOPY WITH PROPOFOL;  Surgeon: Toledo, Benay Pike, MD;  Location: [facility] ENDOSCOPY;  Service: Gastroenterology;  Laterality: N/A;  ? ESOPHAGOGASTRODUODENOSCOPY (EGD) WITH PROPOFOL N/A 10/08/2020  ? Procedure: ESOPHAGOGASTRODUODENOSCOPY (EGD) WITH PROPOFOL;  Surgeon: Toledo, Benay Pike, MD;  Location: [facility] ENDOSCOPY;  Service: Gastroenterology;  Laterality: N/A;  DM ?DEAF, NEEDS SIGN INTERPRETER PER SON  ? REVERSE SHOULDER ARTHROPLASTY Right 12/21/2019  ? Procedure:  REVERSE SHOULDER ARTHROPLASTY;  Surgeon: Lovell Sheehan, MD;  Location: [facility] ORS;  Service: Orthopedics;  Laterality: Right;  ? THUMB ARTHROSCOPY    ? TONSILLECTOMY    ? TYMPANOPLASTY    ? muliple  ?  ?Medications Prior to Admission  ?Medication Sig Dispense Refill Last Dose  ? citalopram (CELEXA) 10 MG tablet Take 10 mg by mouth daily.   Past Week  ? clopidogrel (PLAVIX) 75 MG tablet Take 1 tablet (75 mg total) by mouth daily. 30 tablet 1 Past Week  ? FLONASE ALLERGY RELIEF 50 MCG/ACT nasal spray Place 1 spray into both nostrils 2 (two) times daily.   Past Week  ? furosemide (LASIX) 40 MG tablet Take 40 mg by mouth daily.   Past Week  ? gabapentin (NEURONTIN) 100 MG capsule Take 100 mg by mouth 2 (two) times daily.   Past Week  ? glimepiride (AMARYL) 2 MG tablet Take 2 mg by mouth daily.   Past Week  ? lacosamide 150 MG TABS Take 1 tablet (150 mg total) by mouth 2 (two) times daily. 60 tablet 2 Past Week  ? lactulose (CHRONULAC) 10 GM/15ML solution Take 15 mLs (10 g total) by mouth daily. 236 mL 1 Past Week  ? levETIRAcetam (KEPPRA) 500 MG tablet Take 1 tablet (500 mg total) by mouth 2 (two) times daily.   Past Week  ? losartan (COZAAR) 100 MG tablet Take 100 mg by mouth daily.   Past Week  ? metoprolol succinate (TOPROL-XL) 25 MG 24 hr tablet Take 1 tablet (25 mg total) by mouth daily. 60 tablet 0 Past Week  ? montelukast (SINGULAIR) 10 MG tablet Take 10 mg by mouth at bedtime.   4 Past Week  ? pantoprazole (PROTONIX) 40 MG tablet Take 40 mg by mouth daily.   Past Week  ? potassium chloride (KLOR-CON) 10 MEQ tablet Take 2 tablets (20 mEq total) by mouth daily. 60 tablet 0 Past Week  ? rOPINIRole (REQUIP) 0.5 MG tablet Take 0.5 mg by mouth at bedtime.   Past Week  ? rosuvastatin (CRESTOR) 10 MG tablet Take 10 mg by mouth at bedtime.  5 Past Week  ? SPIRIVA RESPIMAT 2.5 MCG/ACT AERS Inhale 2 puff as directed once a day   Past Week  ? spironolactone (ALDACTONE) 25 MG tablet Take 25 mg by mouth daily.   Past Week  ?  acetaminophen (TYLENOL) 325 MG tablet Take 2 tablets (650 mg total) by mouth every 6 (six) hours as needed for mild pain (or Fever >/= 101). (Patient not taking: Reported on 12/07/2019) 60 tablet 1 Not Taking  ? albuterol (PROVENTIL HFA;VENTOLIN HFA) 108 (90 BASE) MCG/ACT inhaler Inhale 2 puffs into the lungs every 4 (four) hours as needed for wheezing or shortness of breath.   prn at prn  ? albuterol (PROVENTIL) (2.5 MG/3ML) 0.083% nebulizer solution Take 3 mLs (2.5 mg total) by nebulization every 4 (four) hours as needed for wheezing or shortness of breath. (Patient  not taking: Reported on 04/28/2021) 90 mL 5 Not Taking  ? ipratropium-albuterol (DUONEB) 0.5-2.5 (3) MG/3ML SOLN Take 3 mLs by nebulization every 6 (six) hours as needed.   prn at prn  ? ?Social History  ? ?Socioeconomic History  ? Marital status: Widowed  ?  Spouse name: Not on file  ? Number of children: Not on file  ? Years of education: Not on file  ? Highest education level: Not on file  ?Occupational History  ? Not on file  ?Tobacco Use  ? Smoking status: Every Day  ?  Packs/day: 0.50  ?  Years: 52.00  ?  Pack years: 26.00  ?  Types: Cigarettes  ?  Start date: 03/01/1968  ? Smokeless tobacco: Never  ?Vaping Use  ? Vaping Use: Former  ?Substance and Sexual Activity  ? Alcohol use: No  ? Drug use: No  ? Sexual activity: Not Currently  ?Other Topics Concern  ? Not on file  ?Social History Narrative  ? Pt lives in 1 story home with her son, his girlfriend and a family friend  ? Has 2 adult children  ? 9th grade education  ? 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   ? ?Social Determinants of Health  ? ?Financial Resource Strain: Not on file  ?Food Insecurity: Not on file  ?Transportation Needs: Not on file  ?Physical Activity: Not on file  ?Stress: Not on file  ?Social Connections: Not on file  ?Intimate Partner Violence: Not on file  ?  ?Family History  ?Problem Relation Age of Onset  ? Lung cancer Mother   ? CAD Father   ?  Breast cancer Sister   ?  ? ? ?Review of systems complete and found to be negative unless listed above  ? ? ?PHYSICAL EXAM ?General: Elderly Caucasian female, well nourished, in no acute distress.  Sitting at incli

## 2021-10-01 NOTE — Discharge Summary (Addendum)
Physician Discharge Summary  ?[name] TTS:[id number] DOB: May 28, 1951 DOA: 09/29/2021 ? ?PCP: [facility] ? ?Admit date: 09/29/2021 ?Discharge date: 10/01/2021 ? ?Admitted From: home  ?Disposition: home  ? ?Recommendations for Outpatient Follow-up:  ?Follow up with PCP in 1-2 weeks ?F/u w/ ophthalmologist within 2-3 days for blurry vision ?F/u w/ cardio, Dr. Nehemiah Massed, in 1-2 weeks   ? ?Home Health: no  ?Equipment/Devices: ? ?Discharge Condition: stable  ?CODE STATUS: full  ?Diet recommendation: Heart Healthy ? ?Brief/Interim Summary: ?HPI was taken from Dr. Tobie Poet: ?Ms. [name] is a 71 year old femaleWith history of seizures, pulmonary emphysema, non-insulin-dependent diabetes mellitus, hypertension, hyperlipidemia, chronic headaches, restless leg syndrome, COPD, nicotine dependence, chronic diastolic heart failure, nonalcoholic cirrhosis, hearing loss, who presents emergency department for chief concerns of suicidal ideation. ?  ?In the emergency department patient did endorse shortness of breath. ? ?Initial vitals in the ED showed temperature of 97.  5, respiration rate of 18, heart rate of 69, blood pressure 136/82, SPO2 of 99% on room air. ? ?Serum sodium 140, potassium 4.1, chloride 103, bicarb 25, BUN of 7, serum creatinine of 0.63, GFR greater than 60, nonfasting blood glucose 169, WBC 7.9, hemoglobin 14.1, platelets of 216. ? ?Hypertensive troponin was elevated at 158. ?  ?Tylenol level was less than 10, salicylate acid less than 7, ethanol level less than 10. ?  ?UDS ordered and pending. ?  ?Patient is fully IVC. ?  ?ED treatment: Acetaminophen 650 mg p.o. one-time dose. ?-------- ?Patient is deaf and reads lips very well. ?  ?At bedside patient is able to tell me her full name, her age, her current location and the current calendar year. ?  ?She denies any suicidal, homicidal ideation. She states that her family irritate her and made her upset and she tried to go for a walk but  they will not let her. They refused to let her have any social activity and will not let her go to church. They make her stay in her room all day. ?  ?She states that over the last 2 days she has been having worsening shortness of breath and chest pressure. She states the chest pressure is a 3 out of 10 and denies any radiation to her neck, jaw, arms. ?  ?She denies any fever, chills, new cough, sick contacts, abdominal pain, dysuria, hematuria.  She states she has chronic diarrhea and this has been going on for many years. ?  ?When I asked her if she felt safe at home she pauses several moments and then says, 'I am okay.' ?  ?After her endorsement of chest pressure, I asked if anyone is hurting her or hitting her.  She pauses several moments, then states, 'I am okay.' ? ? ?As per Dr. Jimmye Norman 3/27-3/28/23: Pt's IVC was lifted by psychiatry on 09/30/21 stating the pt was not any harm to herself or anyone else. Furthermore, cardio was consulted for elevated troponins and was put IV heparin drip. Pt denied any chest pain or shortness of breath. Echo was done which showed EF 09-23%, diastolic function was indeterminate, mild-moderate AS. IV heparin drip was d/c and no further cardiac work-up was indicated as per cardio. Of note, pt c/o intermittent dizziness and blurry vision. Pt does wear glasses but has not been to the ophthalmologist in a long time. MRI brain did not show any acute intracranial abnormalities. Pt will need f/u outpatient w/ ophthalmologist. Meclizine was given prn for dizziness. Pt verbalized her understanding. For more information,  please see previous progress/consult notes.  ? ?Discharge Diagnoses:  ?Principal Problem: ?  Stress reaction causing mixed disturbance of emotion and conduct ?Active Problems: ?  Essential hypertension ?  Seizure ([place]) ?  Depression ?  Chronic diastolic heart failure ([place]) ?  Tobacco use ?  COPD (chronic obstructive pulmonary disease) ([place]) ?  History of CVA in adulthood ?   Shortness of breath ?  Elevated troponin ?Elevated troponin: etiology unclear, likely demand ischemia. D/c IV heparin drip & no further inpatient cardiac work-up indicated as per cardio   Pt denies any chest pain or shortness of breath. Continue on tele. Cardio recs apprec. F/u outpatient w/ Dr. Nehemiah Massed in 1-2 weeks ? ?Blurry vision: w/ intermittent dizziness. Meclizine prn. Will need to see ophtha outpatient. MRI brain shows no acute intracranial abnormalities ?  ?Hx of CVA: continue on plaxix, statin  ?  ?COPD: w/o exacerbation. Continue on bronchodilators  ?  ?Tobacco use: nicotine patch to prevent w/drawal. Smoking cessation counseling  ?  ?Chronic diastolic heart failure: appears compensated. Continue on lasix, metoprolol, losartan, aldactone. Monitor I/Os  ?  ?Seizure: continue on home dose of keppra, lacosamide. Ativan prn  ? ?Possible suicidal ideation: no risk of harm to self or others as per psych. IVC was lifted as per psych  ? ? ?Discharge Instructions ? ?Discharge Instructions   ? ? Diet - low sodium heart healthy   Complete by: As directed ?  ? Discharge instructions   Complete by: As directed ?  ? F/u w/ PCP in 1-2 weeks. F/u w/ ophthalmologist within 2-3 days for blurry vision. MRI brain is without any acute intracranial abnormalities  ? Discharge instructions   Complete by: As directed ?  ? F/u w/ cardio, Dr. Nehemiah Massed in 1-2 weeks  ? Increase activity slowly   Complete by: As directed ?  ? ?  ? ?Allergies as of 10/01/2021   ? ?   Reactions  ? Aspirin Itching  ? Celebrex [celecoxib] Itching  ? itching  ? Ciprofloxacin Itching  ? Codeine Itching  ? Fosphenytoin Itching  ? Levaquin [levofloxacin In D5w] Itching  ? Levofloxacin Itching  ? Lovastatin Itching  ? Penicillins   ? Documentation indicates severe reaction ?Pt tolerated cephalosporin without adverse reaction 09/18  ? Pravastatin Itching  ? Sulfa Antibiotics Itching  ? ?  ? ?  ?Medication List  ?  ? ?TAKE these medications   ? ?acetaminophen  325 MG tablet ?Commonly known as: TYLENOL ?Take 2 tablets (650 mg total) by mouth every 6 (six) hours as needed for mild pain (or Fever >/= 101). ?  ?albuterol 108 (90 Base) MCG/ACT inhaler ?Commonly known as: VENTOLIN HFA ?Inhale 2 puffs into the lungs every 4 (four) hours as needed for wheezing or shortness of breath. ?  ?albuterol (2.5 MG/3ML) 0.083% nebulizer solution ?Commonly known as: PROVENTIL ?Take 3 mLs (2.5 mg total) by nebulization every 4 (four) hours as needed for wheezing or shortness of breath. ?  ?citalopram 10 MG tablet ?Commonly known as: CELEXA ?Take 10 mg by mouth daily. ?  ?clopidogrel 75 MG tablet ?Commonly known as: PLAVIX ?Take 1 tablet (75 mg total) by mouth daily. ?  ?Flonase Allergy Relief 50 MCG/ACT nasal spray ?Generic drug: fluticasone ?Place 1 spray into both nostrils 2 (two) times daily. ?  ?furosemide 40 MG tablet ?Commonly known as: LASIX ?Take 40 mg by mouth daily. ?  ?gabapentin 100 MG capsule ?Commonly known as: NEURONTIN ?Take 100 mg by mouth 2 (two) times  daily. ?  ?glimepiride 2 MG tablet ?Commonly known as: AMARYL ?Take 2 mg by mouth daily. ?  ?ipratropium-albuterol 0.5-2.5 (3) MG/3ML Soln ?Commonly known as: DUONEB ?Take 3 mLs by nebulization every 6 (six) hours as needed. ?  ?Lacosamide 150 MG Tabs ?Take 1 tablet (150 mg total) by mouth 2 (two) times daily. ?  ?lactulose 10 GM/15ML solution ?Commonly known as: Jenn[place] ?Take 15 mLs (10 g total) by mouth daily. ?  ?levETIRAcetam 500 MG tablet ?Commonly known as: KEPPRA ?Take 1 tablet (500 mg total) by mouth 2 (two) times daily. ?  ?losartan 100 MG tablet ?Commonly known as: COZAAR ?Take 100 mg by mouth daily. ?  ?meclizine 50 MG tablet ?Commonly known as: ANTIVERT ?Take 0.5 tablets (25 mg total) by mouth 2 (two) times daily as needed for up to 5 days for dizziness. ?  ?metoprolol succinate 25 MG 24 hr tablet ?Commonly known as: TOPROL-XL ?Take 1 tablet (25 mg total) by mouth daily. ?  ?montelukast 10 MG tablet ?Commonly  known as: SINGULAIR ?Take 10 mg by mouth at bedtime. ?  ?pantoprazole 40 MG tablet ?Commonly known as: PROTONIX ?Take 40 mg by mouth daily. ?  ?potassium chloride 10 MEQ tablet ?Commonly known as: KLOR-

## 2021-10-01 NOTE — Progress Notes (Signed)
ANTICOAGULATION CONSULT NOTE ? ?Pharmacy Consult for heparin infusion ?Indication: nSTEMI ? ?Patient Measurements: ?Height: _0  (160 cm) ?Weight: 65.6 kg (144 lb 9.6 oz) ?IBW/kg (Calculated) : 52.4 ?Heparin Dosing Weight: 65.6 kg ? ?Labs: ?Recent Labs  ?  09/29/21 ?2136 09/30/21 ?0156 09/30/21 ?0208 09/30/21 ?0174 09/30/21 ?1028 09/30/21 ?1922 10/01/21 ?0457  ?HGB 14.1  --   --  13.8  --   --  13.1  ?HCT 44.3  --   --  43.4  --   --  41.3  ?PLT 216  --   --  221  --   --  183  ?APTT  --   --  31  --   --   --   --   ?LABPROT  --   --  14.0  --   --   --   --   ?INR  --   --  1.1  --   --   --   --   ?HEPARINUNFRC  --   --   --   --  0.28* 0.48 0.44  ?CREATININE 0.63  --   --  0.54  --   --   --   ?TROPONINIHS 158* 710*  --   --   --   --   --   ? ? ? ?Estimated Creatinine Clearance: 59.6 mL/min (by C-G formula based on SCr of 0.54 mg/dL). ? ? ?Medical History: ?Past Medical History:  ?Diagnosis Date  ? Asthma   ? CHF (congestive heart failure) ([place])   ? Cirrhosis, non-alcoholic ([place])   ? COPD (chronic obstructive pulmonary disease) ([place])   ? Deaf   ? Depression   ? Diabetes mellitus without complication ([place])   ? GERD (gastroesophageal reflux disease)   ? Heart murmur   ? Hepatitis   ? History of rheumatic fever   ? History of scarlet fever   ? Hypertension   ? IBS (irritable bowel syndrome)   ? Lymph node disorder   ? arm  ? Neuropathy   ? On home oxygen therapy   ? hs  ? Orthopnea   ? Osteoarthritis   ? RA (rheumatoid arthritis) ([place])   ? RLS (restless legs syndrome)   ? Seizures ([place])   ? Shortness of breath dyspnea   ? Sleep apnea   ? Stroke [facility])   ? tia  ? ? ?Assessment: ?Pt is yo female brought in IVC by [employer] due to suicidal ideation, found with elevated troponin I. Noted hx of DM, HTN, and stroke ? ?Goal of Therapy:  ?Heparin level 0.3-0.7 units/ml ?Monitor platelets by anticoagulation protocol: Yes ? ?Relevant lab results: ? ?Date/time HL      Rate/comment ?3/27 1028 HL 0.28     850 u/hr;  subtherapeutic ?3/27 1922 HL 0.48     950 u/hr; therapeutic x 1 ?3/28 0457       HL 0.44     Therapeutic X 2  ? ?  ?Plan:  ?3/28:  HL @ 0457 = 0.44, therapeutic X 2 ?Will continue pt on current rate and recheck HL on 3/29 with AM labs.  ? ?Paidyn Mcferran D  ?10/01/2021 6:09 AM ? ? ?

## 2021-10-18 ENCOUNTER — Ambulatory Visit
Admission: RE | Admit: 2021-10-18 | Discharge: 2021-10-18 | Disposition: A | Discharge status: Home / Self Care | Payer: Medicare Other | Source: Ambulatory Visit | Attending: Physician Assistant | Admitting: Physician Assistant

## 2021-10-18 DIAGNOSIS — Z1231 Encounter for screening mammogram for malignant neoplasm of breast: Secondary | ICD-10-CM | POA: Diagnosis not present

## 2021-12-03 ENCOUNTER — Other Ambulatory Visit: Payer: Self-pay

## 2021-12-03 ENCOUNTER — Emergency Department: Payer: Medicare Other

## 2021-12-03 ENCOUNTER — Observation Stay
Admission: EM | Admit: 2021-12-03 | Discharge: 2021-12-04 | Disposition: A | Discharge status: Home / Self Care | Payer: Medicare Other | Attending: Internal Medicine | Admitting: Internal Medicine

## 2021-12-03 DIAGNOSIS — E11649 Type 2 diabetes mellitus with hypoglycemia without coma: Secondary | ICD-10-CM | POA: Diagnosis not present

## 2021-12-03 DIAGNOSIS — G2581 Restless legs syndrome: Secondary | ICD-10-CM | POA: Insufficient documentation

## 2021-12-03 DIAGNOSIS — I110 Hypertensive heart disease with heart failure: Secondary | ICD-10-CM | POA: Insufficient documentation

## 2021-12-03 DIAGNOSIS — J440 Chronic obstructive pulmonary disease with acute lower respiratory infection: Secondary | ICD-10-CM | POA: Insufficient documentation

## 2021-12-03 DIAGNOSIS — I5032 Chronic diastolic (congestive) heart failure: Secondary | ICD-10-CM | POA: Insufficient documentation

## 2021-12-03 DIAGNOSIS — J189 Pneumonia, unspecified organism: Secondary | ICD-10-CM | POA: Diagnosis not present

## 2021-12-03 DIAGNOSIS — J90 Pleural effusion, not elsewhere classified: Secondary | ICD-10-CM | POA: Diagnosis not present

## 2021-12-03 DIAGNOSIS — Z801 Family history of malignant neoplasm of trachea, bronchus and lung: Secondary | ICD-10-CM | POA: Insufficient documentation

## 2021-12-03 DIAGNOSIS — Z20822 Contact with and (suspected) exposure to covid-19: Secondary | ICD-10-CM | POA: Insufficient documentation

## 2021-12-03 DIAGNOSIS — H9190 Unspecified hearing loss, unspecified ear: Secondary | ICD-10-CM | POA: Diagnosis not present

## 2021-12-03 DIAGNOSIS — Z9981 Dependence on supplemental oxygen: Secondary | ICD-10-CM | POA: Insufficient documentation

## 2021-12-03 DIAGNOSIS — F419 Anxiety disorder, unspecified: Secondary | ICD-10-CM | POA: Insufficient documentation

## 2021-12-03 DIAGNOSIS — Z8249 Family history of ischemic heart disease and other diseases of the circulatory system: Secondary | ICD-10-CM | POA: Diagnosis not present

## 2021-12-03 DIAGNOSIS — E785 Hyperlipidemia, unspecified: Secondary | ICD-10-CM | POA: Insufficient documentation

## 2021-12-03 DIAGNOSIS — Z79899 Other long term (current) drug therapy: Secondary | ICD-10-CM | POA: Insufficient documentation

## 2021-12-03 DIAGNOSIS — J441 Chronic obstructive pulmonary disease with (acute) exacerbation: Secondary | ICD-10-CM | POA: Diagnosis present

## 2021-12-03 DIAGNOSIS — Z7984 Long term (current) use of oral hypoglycemic drugs: Secondary | ICD-10-CM | POA: Insufficient documentation

## 2021-12-03 DIAGNOSIS — F17210 Nicotine dependence, cigarettes, uncomplicated: Secondary | ICD-10-CM | POA: Diagnosis not present

## 2021-12-03 DIAGNOSIS — J449 Chronic obstructive pulmonary disease, unspecified: Secondary | ICD-10-CM | POA: Insufficient documentation

## 2021-12-03 DIAGNOSIS — J9621 Acute and chronic respiratory failure with hypoxia: Secondary | ICD-10-CM | POA: Diagnosis not present

## 2021-12-03 DIAGNOSIS — E876 Hypokalemia: Secondary | ICD-10-CM | POA: Diagnosis not present

## 2021-12-03 DIAGNOSIS — M6281 Muscle weakness (generalized): Secondary | ICD-10-CM | POA: Insufficient documentation

## 2021-12-03 DIAGNOSIS — K219 Gastro-esophageal reflux disease without esophagitis: Secondary | ICD-10-CM | POA: Insufficient documentation

## 2021-12-03 DIAGNOSIS — E1140 Type 2 diabetes mellitus with diabetic neuropathy, unspecified: Secondary | ICD-10-CM | POA: Insufficient documentation

## 2021-12-03 DIAGNOSIS — K7460 Unspecified cirrhosis of liver: Secondary | ICD-10-CM | POA: Diagnosis present

## 2021-12-03 DIAGNOSIS — G4489 Other headache syndrome: Secondary | ICD-10-CM | POA: Insufficient documentation

## 2021-12-03 DIAGNOSIS — G40909 Epilepsy, unspecified, not intractable, without status epilepticus: Secondary | ICD-10-CM | POA: Insufficient documentation

## 2021-12-03 DIAGNOSIS — G40919 Epilepsy, unspecified, intractable, without status epilepticus: Secondary | ICD-10-CM

## 2021-12-03 DIAGNOSIS — J984 Other disorders of lung: Secondary | ICD-10-CM | POA: Insufficient documentation

## 2021-12-03 DIAGNOSIS — Z96611 Presence of right artificial shoulder joint: Secondary | ICD-10-CM | POA: Insufficient documentation

## 2021-12-03 DIAGNOSIS — K7469 Other cirrhosis of liver: Secondary | ICD-10-CM | POA: Insufficient documentation

## 2021-12-03 DIAGNOSIS — R079 Chest pain, unspecified: Secondary | ICD-10-CM | POA: Diagnosis present

## 2021-12-03 DIAGNOSIS — F32A Depression, unspecified: Secondary | ICD-10-CM | POA: Insufficient documentation

## 2021-12-03 DIAGNOSIS — F329 Major depressive disorder, single episode, unspecified: Secondary | ICD-10-CM | POA: Diagnosis not present

## 2021-12-03 LAB — BASIC METABOLIC PANEL
Anion gap: 9 (ref 5–15)
BUN: 15 mg/dL (ref 8–23)
CO2: 25 mmol/L (ref 22–32)
Calcium: 9.2 mg/dL (ref 8.9–10.3)
Chloride: 103 mmol/L (ref 98–111)
Creatinine, Ser: 0.59 mg/dL (ref 0.44–1.00)
GFR, Estimated: >60 mL/min (ref >60)
Glucose, Bld: 243 mg/dL — ABNORMAL HIGH (ref 70–99)
Potassium: 3.2 mmol/L — ABNORMAL LOW (ref 3.5–5.1)
Sodium: 137 mmol/L (ref 135–145)

## 2021-12-03 LAB — RESP PANEL BY RT-PCR (FLU A&B, COVID) ARPGX2
Influenza A by PCR: NEGATIVE
Influenza B by PCR: NEGATIVE
SARS Coronavirus 2 by RT PCR: NEGATIVE

## 2021-12-03 LAB — CBC
HCT: 41.4 % (ref 36.0–46.0)
Hemoglobin: 13.4 g/dL (ref 12.0–15.0)
MCH: 27.5 pg (ref 26.0–34.0)
MCHC: 32.4 g/dL (ref 30.0–36.0)
MCV: 85 fL (ref 80.0–100.0)
Platelets: 250 10*3/uL (ref 150–400)
RBC: 4.87 MIL/uL (ref 3.87–5.11)
RDW: 13.6 % (ref 11.5–15.5)
WBC: 7.5 10*3/uL (ref 4.0–10.5)
nRBC: 0 % (ref 0.0–0.2)

## 2021-12-03 LAB — LACTIC ACID, PLASMA
Lactic Acid, Venous: 2 mmol/L (ref 0.5–1.9)
Lactic Acid, Venous: 1.5 mmol/L (ref 0.5–1.9)

## 2021-12-03 LAB — CBG MONITORING, ED: Glucose-Capillary: 57 mg/dL — ABNORMAL LOW (ref 70–99)

## 2021-12-03 LAB — GLUCOSE, CAPILLARY
Glucose-Capillary: 114 mg/dL — ABNORMAL HIGH (ref 70–99)
Glucose-Capillary: 153 mg/dL — ABNORMAL HIGH (ref 70–99)

## 2021-12-03 LAB — TROPONIN I (HIGH SENSITIVITY): Troponin I (High Sensitivity): 8 ng/L (ref <18)

## 2021-12-03 MED ORDER — LEVETIRACETAM 500 MG PO TABS: 500.0000 mg | ORAL_TABLET | Freq: Two times a day (BID) | ORAL | Status: DC

## 2021-12-03 MED ORDER — CLOPIDOGREL BISULFATE 75 MG PO TABS
75.0000 mg | ORAL_TABLET | Freq: Every day | ORAL | Status: DC
  Administered 2021-12-04: 75 mg via ORAL
  Filled 2021-12-03: qty 1

## 2021-12-03 MED ORDER — ACETAMINOPHEN 325 MG PO TABS: 650.0000 mg | ORAL_TABLET | Freq: Four times a day (QID) | ORAL | Status: DC | PRN

## 2021-12-03 MED ORDER — IPRATROPIUM-ALBUTEROL 0.5-2.5 (3) MG/3ML IN SOLN
3.0000 mL | Freq: Once | RESPIRATORY_TRACT | Status: AC
  Administered 2021-12-03: 3 mL via RESPIRATORY_TRACT
  Filled 2021-12-03: qty 3

## 2021-12-03 MED ORDER — INSULIN ASPART 100 UNIT/ML IJ SOLN: 0.0000 [IU] | Freq: Three times a day (TID) | INTRAMUSCULAR | Status: DC

## 2021-12-03 MED ORDER — MONTELUKAST SODIUM 10 MG PO TABS
10.0000 mg | ORAL_TABLET | Freq: Every day | ORAL | Status: DC
  Administered 2021-12-03: 10 mg via ORAL
  Filled 2021-12-03: qty 1

## 2021-12-03 MED ORDER — LORAZEPAM 2 MG/ML IJ SOLN: 2.0000 mg | Freq: Four times a day (QID) | INTRAMUSCULAR | Status: DC | PRN

## 2021-12-03 MED ORDER — ENOXAPARIN SODIUM 40 MG/0.4ML IJ SOSY
40.0000 mg | PREFILLED_SYRINGE | INTRAMUSCULAR | Status: DC
  Administered 2021-12-03: 40 mg via SUBCUTANEOUS
  Filled 2021-12-03: qty 0.4

## 2021-12-03 MED ORDER — POLYETHYLENE GLYCOL 3350 17 G PO PACK: 17.0000 g | PACK | Freq: Every day | ORAL | Status: DC | PRN

## 2021-12-03 MED ORDER — CITALOPRAM HYDROBROMIDE 20 MG PO TABS
10.0000 mg | ORAL_TABLET | Freq: Every day | ORAL | Status: DC
  Administered 2021-12-04: 10 mg via ORAL
  Filled 2021-12-03: qty 0.5

## 2021-12-03 MED ORDER — INSULIN ASPART 100 UNIT/ML IJ SOLN: 0.0000 [IU] | Freq: Every day | INTRAMUSCULAR | Status: DC

## 2021-12-03 MED ORDER — ALBUTEROL SULFATE (2.5 MG/3ML) 0.083% IN NEBU: 3.0000 mL | INHALATION_SOLUTION | RESPIRATORY_TRACT | Status: DC | PRN

## 2021-12-03 MED ORDER — PANTOPRAZOLE SODIUM 40 MG PO TBEC
40.0000 mg | DELAYED_RELEASE_TABLET | Freq: Every day | ORAL | Status: DC
  Administered 2021-12-04: 40 mg via ORAL
  Filled 2021-12-03: qty 1

## 2021-12-03 MED ORDER — ONDANSETRON HCL 4 MG/2ML IJ SOLN
4.0000 mg | Freq: Four times a day (QID) | INTRAMUSCULAR | Status: DC | PRN
Start: 2021-12-03 — End: 2021-12-04

## 2021-12-03 MED ORDER — SODIUM CHLORIDE 0.9 % IV SOLN
500.0000 mg | INTRAVENOUS | Status: DC
  Administered 2021-12-04: 500 mg via INTRAVENOUS
  Filled 2021-12-03: qty 5

## 2021-12-03 MED ORDER — SPIRONOLACTONE 25 MG PO TABS
25.0000 mg | ORAL_TABLET | Freq: Every day | ORAL | Status: DC
  Administered 2021-12-04: 25 mg via ORAL
  Filled 2021-12-03: qty 1

## 2021-12-03 MED ORDER — METOPROLOL SUCCINATE ER 50 MG PO TB24: 25.0000 mg | ORAL_TABLET | Freq: Every day | ORAL | Status: DC

## 2021-12-03 MED ORDER — SODIUM CHLORIDE 0.9 % IV SOLN
2.0000 g | INTRAVENOUS | Status: DC
  Administered 2021-12-04: 2 g via INTRAVENOUS
  Filled 2021-12-03: qty 20

## 2021-12-03 MED ORDER — CEFTRIAXONE SODIUM 1 G IJ SOLR
1.0000 g | Freq: Once | INTRAMUSCULAR | Status: AC
Start: 2021-12-03 — End: 2021-12-03
  Administered 2021-12-03: 1 g via INTRAVENOUS
  Filled 2021-12-03: qty 10

## 2021-12-03 MED ORDER — GUAIFENESIN-DM 100-10 MG/5ML PO SYRP
5.0000 mL | ORAL_SOLUTION | ORAL | Status: DC | PRN
  Administered 2021-12-03: 5 mL via ORAL
  Filled 2021-12-03: qty 5

## 2021-12-03 MED ORDER — IPRATROPIUM-ALBUTEROL 0.5-2.5 (3) MG/3ML IN SOLN
3.0000 mL | Freq: Four times a day (QID) | RESPIRATORY_TRACT | Status: DC
  Administered 2021-12-03 – 2021-12-04 (×4): 3 mL via RESPIRATORY_TRACT
  Filled 2021-12-03 (×3): qty 3

## 2021-12-03 MED ORDER — MELATONIN 5 MG PO TABS
5.0000 mg | ORAL_TABLET | Freq: Every evening | ORAL | Status: DC | PRN
Start: 2021-12-03 — End: 2021-12-04

## 2021-12-03 MED ORDER — LACOSAMIDE 50 MG PO TABS
150.0000 mg | ORAL_TABLET | Freq: Two times a day (BID) | ORAL | Status: DC
  Administered 2021-12-03 – 2021-12-04 (×2): 150 mg via ORAL
  Filled 2021-12-03 (×2): qty 3

## 2021-12-03 MED ORDER — FLUTICASONE PROPIONATE 50 MCG/ACT NA SUSP
1.0000 | Freq: Two times a day (BID) | NASAL | Status: DC
  Administered 2021-12-03 – 2021-12-04 (×2): 1 via NASAL
  Filled 2021-12-03: qty 16

## 2021-12-03 MED ORDER — GABAPENTIN 100 MG PO CAPS
100.0000 mg | ORAL_CAPSULE | Freq: Two times a day (BID) | ORAL | Status: DC
  Administered 2021-12-03 – 2021-12-04 (×2): 100 mg via ORAL
  Filled 2021-12-03 (×2): qty 1

## 2021-12-03 MED ORDER — SODIUM CHLORIDE 0.9 % IV SOLN
500.0000 mg | Freq: Once | INTRAVENOUS | Status: AC
Start: 2021-12-03 — End: 2021-12-03
  Administered 2021-12-03: 500 mg via INTRAVENOUS
  Filled 2021-12-03: qty 5

## 2021-12-03 MED ORDER — FUROSEMIDE 40 MG PO TABS
40.0000 mg | ORAL_TABLET | Freq: Every day | ORAL | Status: DC
  Administered 2021-12-04: 40 mg via ORAL
  Filled 2021-12-03: qty 1

## 2021-12-03 MED ORDER — ROSUVASTATIN CALCIUM 10 MG PO TABS
10.0000 mg | ORAL_TABLET | Freq: Every day | ORAL | Status: DC
  Administered 2021-12-03: 10 mg via ORAL
  Filled 2021-12-03: qty 1

## 2021-12-03 MED ORDER — ROPINIROLE HCL 0.25 MG PO TABS
0.5000 mg | ORAL_TABLET | Freq: Every day | ORAL | Status: DC
  Administered 2021-12-03: 0.5 mg via ORAL
  Filled 2021-12-03 (×2): qty 2

## 2021-12-03 NOTE — H&P (Signed)
History and Physical  [name] PFX:[id number] DOB: 07/28/1950 DOA: 12/03/2021  Referring physician: Dr. Cherylann Banas, [place]  PCP: [facility]  Outpatient Specialists: Cardiology. Patient coming from: Home.  Chief Complaint: Shortness of breath, productive cough, generalized weakness.  HPI: [name] is a 71 y.o. female with medical history significant for seizure disorder (No recent seizures per the patient), COPD, chronic hypoxia on 2 L nasal cannula continuously, type 2 diabetes, hypertension, hyperlipidemia, chronic headaches, restless leg syndrome, nicotine dependence, chronic diastolic CHF, nonalcoholic cirrhosis, hearing impairment, who presented to [facility] ED from home due to gradually worsening shortness of breath associated with a productive cough and generalized weakness of 4 days duration.  Endorses subjective chills at home.  Has not checked her temperature.  Upon presentation to the ED, the patient is hypoxic with O2 saturation of 89% on her baseline 2 L [place].  Oxygen supplementation increased to 3 L with O2 saturation in the low 90s.  Chest x-ray showing left lower lobe infiltrates as well as a small left pleural effusion.  No other evidence of volume overload on exam.  Started on IV antibiotics empirically for community-acquired pneumonia, Rocephin and IV azithromycin.  EDP requesting admission for further management.  The patient was admitted by hospitalist service, [facility].  ED Course: Tmax 98.6.  BP 110/63, pulse 60, respiration rate 27, O2 saturation 94% on 4 L nasal cannula.  Lab studies remarkable for serum potassium 3.2, serum glucose 243.  Rest of lab studies are essentially unremarkable.  Review of Systems: Review of systems as noted in the HPI. All other systems reviewed and are negative.   Past Medical History:  Diagnosis Date   Asthma    CHF (congestive heart failure) ([facility])    Cirrhosis, non-alcoholic ([facility])    COPD (chronic obstructive pulmonary  disease) ([place])    Deaf    Depression    Diabetes mellitus without complication ([facility])    GERD (gastroesophageal reflux disease)    Heart murmur    Hepatitis    History of rheumatic fever    History of scarlet fever    Hypertension    IBS (irritable bowel syndrome)    Lymph node disorder    arm   Neuropathy    On home oxygen therapy    hs   Orthopnea    Osteoarthritis    RA (rheumatoid arthritis) ([facility])    RLS (restless legs syndrome)    Seizures (H[facility])    Shortness of breath dyspnea    Sleep apnea    Stroke ([place])    tia   Past Surgical History:  Procedure Laterality Date   ABDOMINAL HYSTERECTOMY     BREAST BIOPSY Right 10/30/2020   Stereo Bx, X-clip,  BENIGN BREAST TISSUE WITH FIBROADENOMATOUS   CATARACT EXTRACTION W/PHACO Right 11/23/2014   Procedure: CATARACT EXTRACTION PHACO AND INTRAOCULAR LENS PLACEMENT ([place]);  Surgeon: Lyla Glassing, MD;  Location: [facility] ORS;  Service: Ophthalmology;  Laterality: Right;   CATARACT EXTRACTION W/PHACO Left 12/14/2014   Procedure: CATARACT EXTRACTION PHACO AND INTRAOCULAR LENS PLACEMENT ([facility]);  Surgeon: Lyla Glassing, MD;  Location: [facility] ORS;  Service: Ophthalmology;  Laterality: Left;  US:01:16.6 AP:15.8 CDE:12.14   CESAREAN SECTION     CHOLECYSTECTOMY     COLONOSCOPY WITH PROPOFOL N/A 10/08/2020   Procedure: COLONOSCOPY WITH PROPOFOL;  Surgeon: Toledo, Benay Pike, MD;  Location: [facility] ENDOSCOPY;  Service: Gastroenterology;  Laterality: N/A;   ESOPHAGOGASTRODUODENOSCOPY (EGD) WITH PROPOFOL N/A 10/08/2020   Procedure: ESOPHAGOGASTRODUODENOSCOPY (EGD) WITH PROPOFOL;  Surgeon: Toledo, Benay Pike, MD;  Location: [facility] ENDOSCOPY;  Service: Gastroenterology;  Laterality: N/A;  DM DEAF, NEEDS SIGN INTERPRETER PER SON   REVERSE SHOULDER ARTHROPLASTY Right 12/21/2019   Procedure: REVERSE SHOULDER ARTHROPLASTY;  Surgeon: Lovell Sheehan, MD;  Location: [facility] ORS;  Service: Orthopedics;  Laterality: Right;   THUMB ARTHROSCOPY     TONSILLECTOMY      TYMPANOPLASTY     muliple    Social History:  reports that she has been smoking cigarettes. She started smoking about 53 years ago. She has a 26.00 pack-year smoking history. She has never used smokeless tobacco. She reports that she does not drink alcohol and does not use drugs.   Allergies  Allergen Reactions   Aspirin Itching   Celebrex [Celecoxib] Itching    itching   Ciprofloxacin Itching   Codeine Itching   Fosphenytoin Itching   Levaquin [Levofloxacin In D5w] Itching   Levofloxacin Itching   Lovastatin Itching   Penicillins     Documentation indicates severe reaction  Pt tolerated cephalosporin without adverse reaction 09/18    Pravastatin Itching   Sulfa Antibiotics Itching    Family History  Problem Relation Age of Onset   Lung cancer Mother    CAD Father    Breast cancer Sister       Prior to Admission medications   Medication Sig Start Date End Date Taking? Authorizing Provider  acetaminophen (TYLENOL) 325 MG tablet Take 2 tablets (650 mg total) by mouth every 6 (six) hours as needed for mild pain (or Fever >/= 101). Patient not taking: Reported on 12/07/2019 08/08/19   Thornell Mule, MD  albuterol (PROVENTIL HFA;VENTOLIN HFA) 108 (90 BASE) MCG/ACT inhaler Inhale 2 puffs into the lungs every 4 (four) hours as needed for wheezing or shortness of breath.    [provider]  albuterol (PROVENTIL) (2.5 MG/3ML) 0.083% nebulizer solution Take 3 mLs (2.5 mg total) by nebulization every 4 (four) hours as needed for wheezing or shortness of breath. Patient not taking: Reported on 04/28/2021 09/23/17   Alisa Graff, FNP  citalopram (CELEXA) 10 MG tablet Take 10 mg by mouth daily.    [provider]  clopidogrel (PLAVIX) 75 MG tablet Take 1 tablet (75 mg total) by mouth daily. 07/17/16   Vaughan Basta, MD  [facility] ALLERGY RELIEF 50 MCG/ACT nasal spray Place 1 spray into both nostrils 2 (two) times daily. 08/29/21   [provider]   furosemide (LASIX) 40 MG tablet Take 40 mg by mouth daily. 07/22/18   [provider]  gabapentin (NEURONTIN) 100 MG capsule Take 100 mg by mouth 2 (two) times daily. 09/18/21   [provider]  glimepiride (AMARYL) 2 MG tablet Take 2 mg by mouth daily.    [provider]  ipratropium-albuterol (DUONEB) 0.5-2.5 (3) MG/3ML SOLN Take 3 mLs by nebulization every 6 (six) hours as needed. 09/19/21   [provider]  lacosamide 150 MG TABS Take 1 tablet (150 mg total) by mouth 2 (two) times daily. 08/01/20   Hosie Poisson, MD  lactulose (CHRONULAC) 10 GM/15ML solution Take 15 mLs (10 g total) by mouth daily. 05/06/21   Domenic Polite, MD  levETIRAcetam (KEPPRA) 500 MG tablet Take 1 tablet (500 mg total) by mouth 2 (two) times daily. 04/30/21   Val Riles, MD  losartan (COZAAR) 100 MG tablet Take 100 mg by mouth daily.    [provider]  Magnesium Oxide (MAGOX 400 PO) Take 1 tablet by mouth daily. 08/06/21  [provider]  metoprolol succinate (TOPROL-XL) 25 MG 24 hr tablet Take 1 tablet (25 mg total) by mouth daily. 08/08/19   Thornell Mule, MD  montelukast (SINGULAIR) 10 MG tablet Take 10 mg by mouth at bedtime.  03/18/16   [provider]  pantoprazole (PROTONIX) 40 MG tablet Take 40 mg by mouth daily.    [provider]  potassium chloride (KLOR-CON) 10 MEQ tablet Take 2 tablets (20 mEq total) by mouth daily. 08/08/19 09/29/21  Thornell Mule, MD  rOPINIRole (REQUIP) 0.5 MG tablet Take 0.5 mg by mouth at bedtime.    [provider]  rosuvastatin (CRESTOR) 10 MG tablet Take 10 mg by mouth at bedtime. 04/21/17   [provider]  SPIRIVA RESPIMAT 2.5 MCG/ACT AERS Inhale 2 puff as directed once a day 09/19/21   [provider]  spironolactone (ALDACTONE) 25 MG tablet Take 25 mg by mouth daily. 09/25/21   [provider]    Physical Exam: BP 110/63 (BP Location: Right Arm)   Pulse 60   Temp 98.6 F  (37 C) (Oral)   Resp (!) 27   Ht _0  (1.6 m)   Wt 65.8 kg   SpO2 94%   BMI 25.69 kg/m   General: 71 y.o. year-old female well developed well nourished in no acute distress.  Alert and oriented x3. Lips reading.  Hearing impairment. Cardiovascular: Regular rate and rhythm with no rubs or gallops.  No thyromegaly or JVD noted.  No lower extremity edema bilaterally. Respiratory: Mild rales at bases with no wheezes noted.  Poor inspiratory effort. Abdomen: Soft nontender nondistended with normal bowel sounds x4 quadrants. Muskuloskeletal: No cyanosis, clubbing or edema noted bilaterally Neuro: CN II-XII intact, strength, sensation, reflexes Skin: No ulcerative lesions noted or rashes Psychiatry: Judgement and insight appear normal. Mood is appropriate for condition and setting          Labs on Admission:  Basic Metabolic Panel: Recent Labs  Lab 12/03/21 0921  NA 137  K 3.2*  CL 103  CO2 25  GLUCOSE 243*  BUN 15  CREATININE 0.59  CALCIUM 9.2   Liver Function Tests: No results for input(s): AST, ALT, ALKPHOS, BILITOT, PROT, ALBUMIN in the last 168 hours. No results for input(s): LIPASE, AMYLASE in the last 168 hours. No results for input(s): AMMONIA in the last 168 hours. CBC: Recent Labs  Lab 12/03/21 0921  WBC 7.5  HGB 13.4  HCT 41.4  MCV 85.0  PLT 250   Cardiac Enzymes: No results for input(s): CKTOTAL, CKMB, CKMBINDEX, TROPONINI in the last 168 hours.  BNP (last 3 results) Recent Labs    04/27/21 1729 09/29/21 2138  BNP 385.8* 192.0*    ProBNP (last 3 results) No results for input(s): PROBNP in the last 8760 hours.  CBG: No results for input(s): GLUCAP in the last 168 hours.  Radiological Exams on Admission: DG Chest 2 View  Result Date: 12/03/2021 CLINICAL DATA:  A 71 year old female presents with chest pain. EXAM: CHEST - 2 VIEW COMPARISON:  September 30, 2021. FINDINGS: Heart size is stable. Hilar structures also stable. Mild cardiac enlargement.  Increased density in the LEFT retrocardiac region obscures a portion of the LEFT hemidiaphragm. No visible pneumothorax. Subtle added density on the lateral projection may reflect small effusion on the LEFT as well as there is mild blunting of LEFT costodiaphragmatic sulcus. Signs of RIGHT reverse shoulder arthroplasty. On limited assessment there is no acute skeletal process. IMPRESSION: 1. Small LEFT effusion with  LEFT lower lobe airspace disease. Correlate with any signs of infection. 2. Mild cardiac enlargement. Electronically Signed   By: Zetta Bills M.D.   On: 12/03/2021 09:51    EKG: I independently viewed the EKG done and my findings are as followed: Normal sinus rhythm rate of 73.  QTc 427.  Assessment/Plan Present on Admission:  CAP (community acquired pneumonia)  Principal Problem:   CAP (community acquired pneumonia)  Left lower lobe CAP, POA, in the setting of COPD Presented with gradually worsening shortness of breath and productive cough associated with generalized weakness, 4 days duration. Personally reviewed chest x-ray done on admission which shows left lower lobe infiltrates and small left pleural effusion. No wheezing on exam, therefore holding off steroids. Continue IV Rocephin and IV azithromycin initiated in the ED. Started DuoNebs every 6 hours Pulmonary toilet, incentive spirometer, antitussives. Mobilize as tolerated. Resume home COPD medications Strep ur and legionella ur ag ordered  Acute on chronic hypoxic respiratory failure At baseline the patient is on 2 L nasal cannula continuously Currently requiring 3 L nasal cannula to maintain O2 saturation greater than 90%. Maintain O2 saturation above 90%. Wean off oxygen supplementation as tolerated.  Type 2 diabetes with hypoglycemia Last Hemoglobin A1c 7.2 in 2022 Hypoglycemic in the ED with CBG 57 Obtain hemoglobin A1c Hold off insulin sliding scale for now due to hypoglycemia.  She is on glimepiride at  home and last dose was taken last night.  Seizure disorder No recent seizures, per the patient. Resume home AEDs Seizure precautions IV Ativan as needed for seizure breakthrough.  HFpEF Euvolemic on exam Resume home diuretic Resume home cardiac medications Start strict I's and O's and daily weight  Hypertension BP is currently at goal Resume home antihypertensives  Chronic anxiety/depression Resume home Celexa  Restless leg syndrome Resume home Requip  Hyperlipidemia Resume home Crestor  GERD Resume home PPI    DVT prophylaxis: Subcu Lovenox daily  Code Status: Full code  Family Communication: None at bedside  Disposition Plan: Admitted to telemetry medical unit  Consults called: None.  Admission status: Inpatient status.   Status is: Inpatient Patient requires at least 2 midnights for further evaluation and treatment Condition.   Kayleen Memos MD [facility] Pager [phone]  If 7PM-7AM, please contact night-coverage [email or web address] Password [email or web address]  12/03/2021, 1:23 PM

## 2021-12-03 NOTE — Progress Notes (Signed)
Hypoglycemic Event  CBG: 57  Treatment: 4 oz juice/soda  Symptoms: Hungry  Follow-up CBG: Time: 1408 CBG Result: recheck on floor  Possible Reasons for Event: Unknown  Comments/MD notified: Irene Pap, DO face to face at [place]   Report given to Bet, LPN to recheck CBG upon arrival to inpatient floor.

## 2021-12-03 NOTE — Progress Notes (Signed)
Critical Lab value.  Lactic Acid: 2.0 Notified attending DO.

## 2021-12-03 NOTE — ED Provider Notes (Signed)
[facility] Provider Note    Event Date/Time   First MD Initiated Contact with Patient 12/03/21 1300     (approximate)   History   Chest Pain   HPI  [name] is a 71 y.o. female with history of COPD on 2 L home O2 and diastolic CHF who presents with shortness of breath, worsened over the last 2 to 3 days, associated with nonproductive cough and with subjective fever and chills.  She denies any vomiting or diarrhea.  She denies any acute leg swelling.    Physical Exam   Triage Vital Signs: ED Triage Vitals  Enc Vitals Group     BP 12/03/21 0925 109/85     Pulse Rate 12/03/21 0925 72     Resp 12/03/21 0925 18     Temp 12/03/21 0925 97.7 F (36.5 C)     Temp Source 12/03/21 1225 Oral     SpO2 12/03/21 0925 (!) 89 %     Weight 12/03/21 0921 145 lb (65.8 kg)     Height 12/03/21 0921 _0  (1.6 m)     Head Circumference --      Peak Flow --      Pain Score 12/03/21 0921 3     Pain Loc --      Pain Edu? --      Excl. in [name]? --     Most recent vital signs: Vitals:   12/03/21 1048 12/03/21 1225  BP:  110/63  Pulse: (!) 55 60  Resp: 18 (!) 27  Temp:  98.6 F (37 C)  SpO2: 99% 94%     General: Awake, no distress.  CV:  Good peripheral perfusion.  Resp:  Slightly increased effort.  Scattered wheezes bilaterally. Abd:  No distention.  Other:  No peripheral edema.   ED Results / Procedures / Treatments   Labs (all labs ordered are listed, but only abnormal results are displayed) Labs Reviewed  BASIC METABOLIC PANEL - Abnormal; Notable for the following components:      Result Value   Potassium 3.2 (*)    Glucose, Bld 243 (*)    All other components within normal limits  RESP PANEL BY RT-PCR (FLU A&B, COVID) ARPGX2  CBC  LACTIC ACID, PLASMA  LACTIC ACID, PLASMA  TROPONIN I (HIGH SENSITIVITY)     EKG  ED ECG REPORT I, Arta Silence, the attending physician, personally viewed and interpreted this ECG.  Date:  12/03/2021 EKG Time: 09 19 Rate: 73 Rhythm: normal sinus rhythm QRS Axis: normal Intervals: normal ST/T Wave abnormalities: normal Narrative Interpretation: no evidence of acute ischemia    RADIOLOGY  Chest x-ray: I independently viewed and interpreted the images; there is a left lower lobe infiltrate with an effusion and no evidence of edema   PROCEDURES:  Critical Care performed: No  Procedures   MEDICATIONS ORDERED IN ED: Medications  enoxaparin (LOVENOX) injection 40 mg (has no administration in time range)  azithromycin (ZITHROMAX) 500 mg in sodium chloride 0.9 % 250 mL IVPB (has no administration in time range)  cefTRIAXone (ROCEPHIN) 2 g in sodium chloride 0.9 % 100 mL IVPB (has no administration in time range)  insulin aspart (novoLOG) injection 0-15 Units (has no administration in time range)  insulin aspart (novoLOG) injection 0-5 Units (has no administration in time range)  acetaminophen (TYLENOL) tablet 650 mg (has no administration in time range)  polyethylene glycol (MIRALAX / GLYCOLAX) packet 17 g (has no administration in time range)  ondansetron (ZOFRAN) injection 4 mg (has no administration in time range)  ipratropium-albuterol (DUONEB) 0.5-2.5 (3) MG/3ML nebulizer solution 3 mL (3 mLs Nebulization Given 12/03/21 1125)  azithromycin (ZITHROMAX) 500 mg in sodium chloride 0.9 % 250 mL IVPB (0 mg Intravenous Stopped 12/03/21 1225)  cefTRIAXone (ROCEPHIN) 1 g in sodium chloride 0.9 % 100 mL IVPB (0 g Intravenous Stopped 12/03/21 1145)     IMPRESSION / MDM / ASSESSMENT AND PLAN / ED COURSE  I reviewed the triage vital signs and the nursing notes.  71 year old female with PMH as noted above presents with worsening shortness of breath over the last several days associated with cough and subjective fever and chills.  I reviewed the past medical records.  The patient was admitted in late March with hypertension and an elevated troponin.  On exam today she is  overall well-appearing but does have slightly increased work of breathing and some wheezes on exam.  O2 saturation was in the high 80s on her normal 2 L home O2 and came up to the 90s on 3 L.  Differential diagnosis includes, but is not limited to, COPD exacerbation, pneumonia, COVID-19 or other viral syndrome, or less likely primary cardiac cause.  Patient's presentation is most consistent with acute presentation with potential threat to life or bodily function.  We will obtain a chest x-ray, lab work-up, give bronchodilators, and reassess.  The patient is on the cardiac monitor to evaluate for evidence of arrhythmia and/or significant heart rate changes.  ----------------------------------------- 1:21 PM on 12/03/2021 -----------------------------------------  Chest x-ray shows left lower lobe consolidation and effusion.  Lab work-up is reassuring.  There is no leukocytosis and the troponin is negative.  Given the patient's increased work of breathing and increased oxygen requirement from baseline I will admit for further work-up and management.  Although the patient was briefly admitted 2 months ago, I feel she is low risk at this time for a CAP and have ordered antibiotics for CAP.  I consulted Dr. Nevada Crane from the hospitalist service; based on our discussion she agrees to admit the patient   FINAL CLINICAL IMPRESSION(S) / ED DIAGNOSES   Final diagnoses:  Community acquired pneumonia of left lower lobe of lung     Rx / DC Orders   ED Discharge Orders     None        Note:  This document was prepared using Dragon voice recognition software and may include unintentional dictation errors.    Arta Silence, MD 12/03/21 1322

## 2021-12-03 NOTE — ED Triage Notes (Signed)
Pt c/o central chest pain with SOB since Saturday. Pt has a hx of COPD and is on 2L  at home. Pt is in NAD on arrival and pt is hearing impaired.

## 2021-12-03 NOTE — ED Notes (Signed)
Informed RN bed assigned

## 2021-12-04 DIAGNOSIS — J441 Chronic obstructive pulmonary disease with (acute) exacerbation: Secondary | ICD-10-CM | POA: Diagnosis present

## 2021-12-04 DIAGNOSIS — J189 Pneumonia, unspecified organism: Secondary | ICD-10-CM | POA: Diagnosis not present

## 2021-12-04 LAB — HEMOGLOBIN A1C
Hgb A1c MFr Bld: 6.2 % — ABNORMAL HIGH (ref 4.8–5.6)
Mean Plasma Glucose: 131.24 mg/dL

## 2021-12-04 LAB — CBC
HCT: 35.5 % — ABNORMAL LOW (ref 36.0–46.0)
Hemoglobin: 11.5 g/dL — ABNORMAL LOW (ref 12.0–15.0)
MCH: 27.3 pg (ref 26.0–34.0)
MCHC: 32.4 g/dL (ref 30.0–36.0)
MCV: 84.3 fL (ref 80.0–100.0)
Platelets: 197 10*3/uL (ref 150–400)
RBC: 4.21 MIL/uL (ref 3.87–5.11)
RDW: 13.7 % (ref 11.5–15.5)
WBC: 6 10*3/uL (ref 4.0–10.5)
nRBC: 0 % (ref 0.0–0.2)

## 2021-12-04 LAB — COMPREHENSIVE METABOLIC PANEL
ALT: 15 U/L (ref 0–44)
AST: 16 U/L (ref 15–41)
Albumin: 3.1 g/dL — ABNORMAL LOW (ref 3.5–5.0)
Alkaline Phosphatase: 82 U/L (ref 38–126)
Anion gap: 9 (ref 5–15)
BUN: 11 mg/dL (ref 8–23)
CO2: 25 mmol/L (ref 22–32)
Calcium: 8.7 mg/dL — ABNORMAL LOW (ref 8.9–10.3)
Chloride: 106 mmol/L (ref 98–111)
Creatinine, Ser: 0.45 mg/dL (ref 0.44–1.00)
GFR, Estimated: >60 mL/min (ref >60)
Glucose, Bld: 90 mg/dL (ref 70–99)
Potassium: 2.9 mmol/L — ABNORMAL LOW (ref 3.5–5.1)
Sodium: 140 mmol/L (ref 135–145)
Total Bilirubin: 0.4 mg/dL (ref 0.3–1.2)
Total Protein: 6 g/dL — ABNORMAL LOW (ref 6.5–8.1)

## 2021-12-04 LAB — GLUCOSE, CAPILLARY
Glucose-Capillary: 111 mg/dL — ABNORMAL HIGH (ref 70–99)
Glucose-Capillary: 92 mg/dL (ref 70–99)
Glucose-Capillary: 124 mg/dL — ABNORMAL HIGH (ref 70–99)

## 2021-12-04 LAB — PHOSPHORUS: Phosphorus: 4.1 mg/dL (ref 2.5–4.6)

## 2021-12-04 LAB — STREP PNEUMONIAE URINARY ANTIGEN: Strep Pneumo Urinary Antigen: NEGATIVE

## 2021-12-04 LAB — MAGNESIUM: Magnesium: 1.9 mg/dL (ref 1.7–2.4)

## 2021-12-04 MED ORDER — AZITHROMYCIN 500 MG PO TABS: 500.0000 mg | ORAL_TABLET | Freq: Every day | ORAL | 0 refills | Status: DC

## 2021-12-04 MED ORDER — METHYLPREDNISOLONE SODIUM SUCC 40 MG IJ SOLR
40.0000 mg | Freq: Once | INTRAMUSCULAR | Status: DC
  Filled 2021-12-04: qty 1

## 2021-12-04 MED ORDER — GUAIFENESIN ER 600 MG PO TB12: 600.0000 mg | ORAL_TABLET | Freq: Two times a day (BID) | ORAL | 2 refills | Status: DC

## 2021-12-04 MED ORDER — POTASSIUM CHLORIDE CRYS ER 20 MEQ PO TBCR
40.0000 meq | EXTENDED_RELEASE_TABLET | ORAL | Status: AC
  Filled 2021-12-04 (×2): qty 2

## 2021-12-04 MED ORDER — SPIRIVA RESPIMAT 2.5 MCG/ACT IN AERS: INHALATION_SPRAY | RESPIRATORY_TRACT | 0 refills | Status: DC

## 2021-12-04 MED ORDER — IPRATROPIUM-ALBUTEROL 0.5-2.5 (3) MG/3ML IN SOLN: 3.0000 mL | Freq: Three times a day (TID) | RESPIRATORY_TRACT | 0 refills | Status: DC

## 2021-12-04 MED ORDER — PREDNISONE 10 MG PO TABS: ORAL_TABLET | ORAL | 0 refills | Status: DC

## 2021-12-04 MED ORDER — CEFDINIR 300 MG PO CAPS: 300.0000 mg | ORAL_CAPSULE | Freq: Two times a day (BID) | ORAL | 0 refills | Status: AC

## 2021-12-04 MED ORDER — AZITHROMYCIN 500 MG PO TABS: 500.0000 mg | ORAL_TABLET | Freq: Every day | ORAL | 0 refills | Status: AC

## 2021-12-04 NOTE — Evaluation (Signed)
Occupational Therapy Evaluation Patient Details Name: [name] MRN: [id number] DOB: February 28, 1951 Today's Date: 12/04/2021   History of Present Illness Pt admitted for complaints of SOB and chest pain, now diagnosed with CAP. History includes deafness, seizures, DM, HTN, restless leg, and COPD (2L of chronic O2).   Clinical Impression   Pt was seen for OT evaluation this date. Prior to hospital admission, pt was independent with mobility, but reported needs assistance with feeding and dressing. Pt lives in an apartment with son and his significant other. Pt does not drive, but son provides transportation. Pt presents to acute OT demonstrating impaired ADL performance and functional mobility 2/2 decreased functional strength and ROM in RUE. RUE ROM (see below for details), limiting occupational performance, however pt reports baseline since shoulder surgery ~2 years. Pt currently requires SETUP for feeding - needs assistance opening tops of containers and cutting food; independent for functional mobillity; and pt utilized compensatory strategies to complete grooming tasks. All education completed, will sign off. Upon hospital discharge, recommend outpatient OT.      Recommendations for follow up therapy are one component of a multi-disciplinary discharge planning process, led by the attending physician.  Recommendations may be updated based on patient status, additional functional criteria and insurance authorization.   Follow Up Recommendations  Outpatient OT    Assistance Recommended at Discharge PRN  Patient can return home with the following Assistance with feeding;A little help with bathing/dressing/bathroom;Assist for transportation;Assistance with cooking/housework    Functional Status Assessment  Patient has had a recent decline in their functional status and demonstrates the ability to make significant improvements in function in a reasonable and predictable amount of time.   Equipment Recommendations  None recommended by OT    Recommendations for Other Services       Precautions / Restrictions Precautions Precautions: Fall Restrictions Weight Bearing Restrictions: No      Mobility Bed Mobility Overal bed mobility: Independent                  Transfers Overall transfer level: Independent                 General transfer comment: Pt ambulated in hallway with 2L of oxygen      Balance Overall balance assessment: No apparent balance deficits (not formally assessed)                                         ADL either performed or assessed with clinical judgement   ADL Overall ADL's : Needs assistance/impaired;At baseline                                       General ADL Comments: SETUP for feeding - needs assistance opening tops of containers and cutting food. Independent for functional mobillity. Pt utilized compensatory strategies to complete grooming tasks.      Pertinent Vitals/Pain Pain Assessment Pain Assessment: Faces Faces Pain Scale: Hurts little more Pain Location: R shoulder Pain Descriptors / Indicators: Sharp Pain Intervention(s): Limited activity within patient's tolerance, Repositioned     Hand Dominance Right   Extremity/Trunk Assessment Upper Extremity Assessment Upper Extremity Assessment: RUE deficits/detail RUE Deficits / Details: LUE WFL; RUE AROM: external rotation ~45 degrees, shoulder flexion ~90 degrees, and elbow flexion >100 degrees. Increased tremors in RUE with  movement - pt reported tremors started after shoulder surgery. RUE: Shoulder pain with ROM   Lower Extremity Assessment Lower Extremity Assessment: Overall WFL for tasks assessed (Pt reported numbness in LE)       Communication Communication Communication: Deaf (Prefers to read lips instead of interpreter)   Cognition Arousal/Alertness: Awake/alert Behavior During Therapy: WFL for tasks  assessed/performed Overall Cognitive Status: Within Functional Limits for tasks assessed                                       General Comments  SpO2 86% RA, resolved to 91% on 2L.            Home Living Family/patient expects to be discharged to:: Private residence Living Arrangements: Children (Pt reported lives with son and his significant other) Available Help at Discharge: Family;Available PRN/intermittently Type of Home: Apartment Home Access: Stairs to enter Entrance Stairs-Number of Steps: 1 small threshold step Entrance Stairs-Rails: None Home Layout: One level     Bathroom Shower/Tub: Teacher, early years/pre: Standard     Home Equipment: Shower seat          Prior Functioning/Environment Prior Level of Function : Independent/Modified Independent             Mobility Comments: Pt reports she does not drive and son provides transportation ADLs Comments: Pt reports challenges with feeding and dressing due to pain and limited ROM from recent shoulder surgery        OT Problem List: Decreased strength;Decreased range of motion      OT Treatment/Interventions:      OT Goals(Current goals can be found in the care plan section) Acute Rehab OT Goals Patient Stated Goal: To be able to feed herself with RUE without assistance OT Goal Formulation: With patient Time For Goal Achievement: 12/18/21 Potential to Achieve Goals: Good  OT Frequency:         AM-PAC OT "6 Clicks" Daily Activity     Outcome Measure Help from another person eating meals?: A Little Help from another person taking care of personal grooming?: A Little Help from another person toileting, which includes using toliet, bedpan, or urinal?: None Help from another person bathing (including washing, rinsing, drying)?: A Little Help from another person to put on and taking off regular upper body clothing?: A Little Help from another person to put on and taking off  regular lower body clothing?: A Little 6 Click Score: 19   End of Session Equipment Utilized During Treatment: Oxygen Nurse Communication: Other (comment) (Oxygen)  Activity Tolerance: Patient tolerated treatment well Patient left: in bed;with call bell/phone within reach (Pt left at EOB to eat lunch)  OT Visit Diagnosis: Muscle weakness (generalized) (M62.81)                Time: 2423-5361 OT Time Calculation (min): 27 min Charges:  OT General Charges $OT Visit: 1 Visit OT Evaluation $OT Eval Low Complexity: 1 Low OT Treatments $Self Care/Home Management : 8-22 mins  [employer] OTDS  [employer] 12/04/2021, 2:22 PM

## 2021-12-04 NOTE — Progress Notes (Signed)
Met with the patient in the room, She lives at home with her son He will take her to his appointments on his days off She walks unassisted She will need oxygen continuous currently using at night only She is not sure what the company is that provides her oxygen, I reached out to [employer] to inquire if they provide I faxed the outpatient oT referral to [facility] outpatient I Explained to her that her [employer] provides medical transportation to Dr appointments if she called the number on the back of her card and signed up She wants to go home today

## 2021-12-04 NOTE — Care Management CC44 (Signed)
Condition Code 44 Documentation Completed  Patient Details  Name: [name] MRN: [id number] Date of Birth: [birth year]   Condition Code 44 given:  Yes Patient signature on Condition Code 44 notice:  Yes Documentation of 2 MD's agreement:  Yes Code 44 added to claim:  Yes    Conception Oms, RN 12/04/2021, 2:06 PM

## 2021-12-04 NOTE — Evaluation (Signed)
Physical Therapy Evaluation Patient Details Name: [name] MRN: [id number] DOB: 08-Nov-1950 Today's Date: 12/04/2021  History of Present Illness  Pt admitted for complaints of SOB and chest pain, now diagnosed with CAP. History includes deafness, seizures, DM, HTN, restless leg, and COPD (2L of chronic O2).  Clinical Impression  Pt is a pleasant 71 year old female who was admitted for PNA. Pt performs bed mobility, transfers, and ambulation with independence. Pt is at baseline level. Pt does not require any further PT needs at this time. Pt will be dc in house and does not require follow up. RN aware. Will dc current orders. SaO2 on room air at rest = 95% SaO2 on room air while ambulating = 87% SaO2 on 2 liters of O2 while ambulating = 92%       Recommendations for follow up therapy are one component of a multi-disciplinary discharge planning process, led by the attending physician.  Recommendations may be updated based on patient status, additional functional criteria and insurance authorization.  Follow Up Recommendations No PT follow up    Assistance Recommended at Discharge PRN  Patient can return home with the following       Equipment Recommendations None recommended by PT  Recommendations for Other Services       Functional Status Assessment Patient has not had a recent decline in their functional status     Precautions / Restrictions Precautions Precautions: Fall Restrictions Weight Bearing Restrictions: No      Mobility  Bed Mobility Overal bed mobility: Independent             General bed mobility comments: safe technique with ease of effort    Transfers Overall transfer level: Independent Equipment used: None               General transfer comment: safe technique with ease of effort. All mobility performed on RA.    Ambulation/Gait Ambulation/Gait assistance: Independent Gait Distance (Feet): 40 Feet Assistive device: None Gait  Pattern/deviations: WFL(Within Functional Limits)       General Gait Details: safe technique however does report slight SOB symptoms. All mobility performed on RA with sats at 87%. Further ambulation on O2 performed  Stairs            Wheelchair Mobility    Modified Rankin (Stroke Patients Only)       Balance Overall balance assessment: No apparent balance deficits (not formally assessed)                                           Pertinent Vitals/Pain Pain Assessment Pain Assessment: No/denies pain    Home Living Family/patient expects to be discharged to:: Private residence Living Arrangements: Children (son and family) Available Help at Discharge: Family;Available PRN/intermittently Type of Home: Apartment Home Access: Stairs to enter Entrance Stairs-Rails: None Entrance Stairs-Number of Steps: 1 small threshold step   Home Layout: One level Home Equipment: Shower seat      Prior Function Prior Level of Function : Independent/Modified Independent             Mobility Comments: Pt reports she was independent without AD, does not drive. Reports no falls ADLs Comments: reports she has trouble with dressing and feeding herself due to recent shoulder surgery     Hand Dominance   Dominant Hand: Right    Extremity/Trunk Assessment   Upper Extremity Assessment  Upper Extremity Assessment: Generalized weakness (R UE grossly 3+/5; L UE grossly WNL)    Lower Extremity Assessment Lower Extremity Assessment: Overall WFL for tasks assessed       Communication   Communication: Deaf (reads lips)  Cognition Arousal/Alertness: Awake/alert Behavior During Therapy: WFL for tasks assessed/performed Overall Cognitive Status: Within Functional Limits for tasks assessed                                          General Comments      Exercises Other Exercises Other Exercises: further ambulation performed on 2L of O2 with sats  at 90% with exertion. Indep with no AD performed   Assessment/Plan    PT Assessment Patient does not need any further PT services  PT Problem List         PT Treatment Interventions      PT Goals (Current goals can be found in the Care Plan section)  Acute Rehab PT Goals Patient Stated Goal: to go home PT Goal Formulation: All assessment and education complete, DC therapy Time For Goal Achievement: 12/04/21 Potential to Achieve Goals: Good    Frequency       Co-evaluation               AM-PAC PT "6 Clicks" Mobility  Outcome Measure Help needed turning from your back to your side while in a flat bed without using bedrails?: None Help needed moving from lying on your back to sitting on the side of a flat bed without using bedrails?: None Help needed moving to and from a bed to a chair (including a wheelchair)?: None Help needed standing up from a chair using your arms (e.g., wheelchair or bedside chair)?: None Help needed to walk in hospital room?: None Help needed climbing 3-5 steps with a railing? : None 6 Click Score: 24    End of Session Equipment Utilized During Treatment: Oxygen Activity Tolerance: Patient tolerated treatment well Patient left: in bed (seated at EOB) Nurse Communication: Mobility status PT Visit Diagnosis: Muscle weakness (generalized) (M62.81)    Time: 0211-1552 PT Time Calculation (min) (ACUTE ONLY): 10 min   Charges:   PT Evaluation $PT Eval Low Complexity: 1 Low          Greggory Stallion, PT, DPT, GCS [phone]   Roland Prine 12/04/2021, 11:27 AM

## 2021-12-04 NOTE — Discharge Summary (Addendum)
Physician Discharge Summary   Patient: [name] MRN: [id number] DOB: 01/25/1951  Admit date:     12/03/2021  Discharge date: 12/04/21  Discharge Physician: Berle Mull  PCP: [facility]  Recommendations at discharge: Follow-up with PCP in 1 week.  Discharge Diagnoses: Principal Problem:   CAP (community acquired pneumonia) Active Problems:   Depression   Hypokalemia   Seizure disorder (HCC)   Chronic diastolic heart failure (HCC)   Cirrhosis, non-alcoholic (HCC)   COPD with acute exacerbation [facility])  Hospital Course: 71 year old female with past medical history of seizure disorder, COPD, chronic hypoxia on 2 LPM at night.  Type II DM, HTN, HLD.  Presents to the hospital with complaints of shortness of breath.  Found to have acute pneumonia and COPD exacerbation.  Assessment and Plan: Community-acquired pneumonia. Acute on chronic hypoxic respiratory failure. Mild COPD exacerbation. Treated with IV antibiotics. Follow-up blood cultures. Does not use oxygen at daytime.  Only use oxygen at nighttime.  Currently requiring oxygen at daytime on ambulation. We will continue to p.o. on discharge. Added steroids. Continue nebulizer therapy.  New prescriptions provided. Continue antibiotics.  Type II DM, without long-term insulin use without complication. Continuing home regimen on discharge.  Seizure disorder. Continue Vimpat.  Chronic diastolic CHF. Continue home regimen. Does not appear to be volume overloaded.  Anxiety Continue Celexa  Restless leg syndrome Continue Requip.  Hypokalemia. Continue potassium at discharge.  History of cirrhosis. Compensated.  Monitor.  Outpatient OT recommended.  Consultants: none Procedures performed:  none DISCHARGE MEDICATION: Allergies as of 12/04/2021       Reactions   Aspirin Itching   Celebrex [celecoxib] Itching   itching   Ciprofloxacin Itching   Codeine Itching   Fosphenytoin  Itching   Levaquin [levofloxacin In D5w] Itching   Levofloxacin Itching   Lovastatin Itching   Penicillins    Documentation indicates severe reaction Pt tolerated cephalosporin without adverse reaction 09/18   Pravastatin Itching   Sulfa Antibiotics Itching        Medication List     STOP taking these medications    lactulose 10 GM/15ML solution Commonly known as: CHRONULAC   levETIRAcetam 500 MG tablet Commonly known as: KEPPRA       TAKE these medications    acetaminophen 325 MG tablet Commonly known as: TYLENOL Take 2 tablets (650 mg total) by mouth every 6 (six) hours as needed for mild pain (or Fever >/= 101).   albuterol 108 (90 Base) MCG/ACT inhaler Commonly known as: VENTOLIN HFA Inhale 2 puffs into the lungs every 4 (four) hours as needed for wheezing or shortness of breath. What changed: Another medication with the same name was removed. Continue taking this medication, and follow the directions you see here.   azithromycin 500 MG tablet Commonly known as: Zithromax Take 1 tablet (500 mg total) by mouth daily for 5 days.   cefdinir 300 MG capsule Commonly known as: OMNICEF Take 1 capsule (300 mg total) by mouth 2 (two) times daily for 5 days.   citalopram 10 MG tablet Commonly known as: CELEXA Take 10 mg by mouth daily.   clopidogrel 75 MG tablet Commonly known as: PLAVIX Take 1 tablet (75 mg total) by mouth daily.   Flonase Allergy Relief 50 MCG/ACT nasal spray Generic drug: fluticasone Place 1 spray into both nostrils 2 (two) times daily.   furosemide 40 MG tablet Commonly known as: LASIX Take 40 mg by mouth daily.   gabapentin 100 MG capsule Commonly  known as: NEURONTIN Take 100 mg by mouth 2 (two) times daily.   glimepiride 2 MG tablet Commonly known as: AMARYL Take 2 mg by mouth daily.   guaiFENesin 600 MG 12 hr tablet Commonly known as: Mucinex Take 1 tablet (600 mg total) by mouth 2 (two) times daily.   ipratropium-albuterol  0.5-2.5 (3) MG/3ML Soln Commonly known as: DUONEB Take 3 mLs by nebulization in the morning, at noon, and at bedtime for 5 days. What changed:  when to take this reasons to take this   Lacosamide 150 MG Tabs Take 1 tablet (150 mg total) by mouth 2 (two) times daily.   losartan 100 MG tablet Commonly known as: COZAAR Take 100 mg by mouth daily.   MAGOX 400 PO Take 1 tablet by mouth daily.   metoprolol succinate 25 MG 24 hr tablet Commonly known as: TOPROL-XL Take 1 tablet (25 mg total) by mouth daily.   montelukast 10 MG tablet Commonly known as: SINGULAIR Take 10 mg by mouth at bedtime.   pantoprazole 40 MG tablet Commonly known as: PROTONIX Take 40 mg by mouth daily.   potassium chloride 10 MEQ tablet Commonly known as: KLOR-CON Take 2 tablets (20 mEq total) by mouth daily.   predniSONE 10 MG tablet Commonly known as: DELTASONE Take 53m daily for 3days,Take 341mdaily for 3days,Take 2028maily for 3days,Take 57m2mily for 3days, then stop   rOPINIRole 0.5 MG tablet Commonly known as: REQUIP Take 0.5 mg by mouth at bedtime.   rosuvastatin 10 MG tablet Commonly known as: CRESTOR Take 10 mg by mouth at bedtime.   Spiriva Respimat 2.5 MCG/ACT Aers Generic drug: Tiotropium Bromide Monohydrate Inhale 2 puff as directed once a day   spironolactone 25 MG tablet Commonly known as: ALDACTONE Take 25 mg by mouth daily.               Durable Medical Equipment  (From admission, onward)           Start     Ordered   12/04/21 1225  For home use only DME oxygen  Once       Question Answer Comment  Length of Need Lifetime   Mode or (Route) Nasal cannula   Liters per Minute 2   Frequency Continuous (stationary and portable oxygen unit needed)   Oxygen delivery system Gas      12/04/21 1224            Disposition: Home Diet recommendation: Cardiac diet  Discharge Exam: Vitals:   12/04/21 0340 12/04/21 0500 12/04/21 0817 12/04/21 1506  BP: (!)  143/53  (!) 151/54 (!) 183/59  Pulse: (!) 59  (!) 56 62  Resp: _0 Temp: 98 F (36.7 C)  97.7 F (36.5 C) 98.2 F (36.8 C)  TempSrc:   Oral Oral  SpO2: 95%   95%  Weight:  74.1 kg    Height:       General: Appear in mild distress; no visible Abnormal Neck Mass Or lumps, Conjunctiva normal Cardiovascular: S1 and S2 Present, no Murmur, Respiratory: good respiratory effort, Bilateral Air entry present and no Crackles, Occasional  wheezes Abdomen: Bowel Sound present, Non tender Extremities: no Pedal edema Neurology: alert and oriented to time, place, and person Gait not checked due to patient safety concerns Filed Weights   12/03/21 0921 12/04/21 0500  Weight: 65.8 kg 74.1 kg   Condition at discharge: stable  The results of significant diagnostics from this hospitalization (including imaging,  microbiology, ancillary and laboratory) are listed below for reference.   Imaging Studies: DG Chest 2 View  Result Date: 12/03/2021 CLINICAL DATA:  A 71 year old female presents with chest pain. EXAM: CHEST - 2 VIEW COMPARISON:  September 30, 2021. FINDINGS: Heart size is stable. Hilar structures also stable. Mild cardiac enlargement. Increased density in the LEFT retrocardiac region obscures a portion of the LEFT hemidiaphragm. No visible pneumothorax. Subtle added density on the lateral projection may reflect small effusion on the LEFT as well as there is mild blunting of LEFT costodiaphragmatic sulcus. Signs of RIGHT reverse shoulder arthroplasty. On limited assessment there is no acute skeletal process. IMPRESSION: 1. Small LEFT effusion with LEFT lower lobe airspace disease. Correlate with any signs of infection. 2. Mild cardiac enlargement. Electronically Signed   By: Zetta Bills M.D.   On: 12/03/2021 09:51    Microbiology: Results for orders placed or performed during the hospital encounter of 12/03/21  Resp Panel by RT-PCR (Flu A&B, Covid) Anterior Nasal Swab     Status: None    Collection Time: 12/03/21 12:17 PM   Specimen: Anterior Nasal Swab  Result Value Ref Range Status   SARS Coronavirus 2 by RT PCR NEGATIVE NEGATIVE Final    Comment: (NOTE) SARS-CoV-2 target nucleic acids are NOT DETECTED.  The SARS-CoV-2 RNA is generally detectable in upper respiratory specimens during the acute phase of infection. The lowest concentration of SARS-CoV-2 viral copies this assay can detect is 138 copies/mL. A negative result does not preclude SARS-Cov-2 infection and should not be used as the sole basis for treatment or other patient management decisions. A negative result may occur with  improper specimen collection/handling, submission of specimen other than nasopharyngeal swab, presence of viral mutation(s) within the areas targeted by this assay, and inadequate number of viral copies(<138 copies/mL). A negative result must be combined with clinical observations, patient history, and epidemiological information. The expected result is Negative.  Fact Sheet for Patients:  EntrepreneurPulse.com.au  Fact Sheet for Healthcare Providers:  IncredibleEmployment.be  This test is no t yet approved or cleared by the [place] FDA and  has been authorized for detection and/or diagnosis of SARS-CoV-2 by FDA under an Emergency Use Authorization (EUA). This EUA will remain  in effect (meaning this test can be used) for the duration of the COVID-19 declaration under Section 564(b)(1) of the Act, 21 U.S.C.section 360bbb-3(b)(1), unless the authorization is terminated  or revoked sooner.       Influenza A by PCR NEGATIVE NEGATIVE Final   Influenza B by PCR NEGATIVE NEGATIVE Final    Comment: (NOTE) The Xpert Xpress SARS-CoV-2/FLU/RSV plus assay is intended as an aid in the diagnosis of influenza from Nasopharyngeal swab specimens and should not be used as a sole basis for treatment. Nasal washings and aspirates are unacceptable for  Xpert Xpress SARS-CoV-2/FLU/RSV testing.  Fact Sheet for Patients: EntrepreneurPulse.com.au  Fact Sheet for Healthcare Providers: IncredibleEmployment.be  This test is not yet approved or cleared by the [place] FDA and has been authorized for detection and/or diagnosis of SARS-CoV-2 by FDA under an Emergency Use Authorization (EUA). This EUA will remain in effect (meaning this test can be used) for the duration of the COVID-19 declaration under Section 564(b)(1) of the Act, 21 U.S.C. section 360bbb-3(b)(1), unless the authorization is terminated or revoked.  Performed at [facility], [address] [place], [place] [address]     Labs: CBC: Recent Labs  Lab 12/03/21 0921 12/04/21 0307  WBC 7.5 6.0  HGB 13.4 11.5*  HCT 41.4 35.5*  MCV 85.0 84.3  PLT 250 237   Basic Metabolic Panel: Recent Labs  Lab 12/03/21 0921 12/04/21 0307  NA 137 140  K 3.2* 2.9*  CL 103 106  CO2 25 25  GLUCOSE 243* 90  BUN 15 11  CREATININE 0.59 0.45  CALCIUM 9.2 8.7*  MG  --  1.9  PHOS  --  4.1   Liver Function Tests: Recent Labs  Lab 12/04/21 0307  AST 16  ALT 15  ALKPHOS 82  BILITOT 0.4  PROT 6.0*  ALBUMIN 3.1*   CBG: Recent Labs  Lab 12/03/21 1428 12/03/21 1714 12/04/21 0115 12/04/21 0807 12/04/21 1127  GLUCAP 114* 153* 111* 92 124*    Discharge time spent: greater than 30 minutes.  Signed: Berle Mull, MD [facility]

## 2021-12-04 NOTE — TOC Progression Note (Signed)
Transition of Care [facility]) - Progression Note    Patient Details  Name: [name] MRN: [id number] Date of Birth: October 24, 1950  Transition of Care [facility]) CM/SW [place], RN Phone Number: 12/04/2021, 12:37 PM  Clinical Narrative:    [employer] does supply the patient's oxygen, they have her as continuous at home already   Expected Discharge Plan: OP Rehab Barriers to Discharge: Barriers Resolved  Expected Discharge Plan and Services Expected Discharge Plan: OP Rehab   Discharge Planning Services: CM Consult   Living arrangements for the past 2 months: Single Family Home                 DME Arranged: N/A         HH Arranged: NA           Social Determinants of Health (SDOH) Interventions    Readmission Risk Interventions     View : No data to display.

## 2021-12-04 NOTE — Plan of Care (Signed)
  Problem: Education: Goal: Knowledge of General Education information will improve Description: Including pain rating scale, medication(s)/side effects and non-pharmacologic comfort measures Outcome: Progressing   Problem: Health Behavior/Discharge Planning: Goal: Ability to manage health-related needs will improve Outcome: Progressing

## 2021-12-05 LAB — LEGIONELLA PNEUMOPHILA SEROGP 1 UR AG: L. pneumophila Serogp 1 Ur Ag: NEGATIVE

## 2022-03-30 ENCOUNTER — Emergency Department: Payer: Medicare Other

## 2022-03-30 ENCOUNTER — Inpatient Hospital Stay
Admission: EM | Admit: 2022-03-30 | Discharge: 2022-04-02 | DRG: 190 | Disposition: A | Discharge status: Other Acute Inpatient Hospital | Payer: Medicare Other | Attending: Internal Medicine | Admitting: Internal Medicine

## 2022-03-30 ENCOUNTER — Other Ambulatory Visit: Payer: Self-pay

## 2022-03-30 ENCOUNTER — Observation Stay: Payer: Medicare Other

## 2022-03-30 ENCOUNTER — Encounter: Payer: Self-pay | Admitting: Medical Oncology

## 2022-03-30 DIAGNOSIS — H9190 Unspecified hearing loss, unspecified ear: Secondary | ICD-10-CM | POA: Diagnosis present

## 2022-03-30 DIAGNOSIS — Z888 Allergy status to other drugs, medicaments and biological substances status: Secondary | ICD-10-CM

## 2022-03-30 DIAGNOSIS — I110 Hypertensive heart disease with heart failure: Secondary | ICD-10-CM | POA: Diagnosis present

## 2022-03-30 DIAGNOSIS — Z9981 Dependence on supplemental oxygen: Secondary | ICD-10-CM

## 2022-03-30 DIAGNOSIS — Z882 Allergy status to sulfonamides status: Secondary | ICD-10-CM

## 2022-03-30 DIAGNOSIS — G9341 Metabolic encephalopathy: Secondary | ICD-10-CM

## 2022-03-30 DIAGNOSIS — I509 Heart failure, unspecified: Secondary | ICD-10-CM

## 2022-03-30 DIAGNOSIS — G40919 Epilepsy, unspecified, intractable, without status epilepticus: Secondary | ICD-10-CM

## 2022-03-30 DIAGNOSIS — Z91138 Patient's unintentional underdosing of medication regimen for other reason: Secondary | ICD-10-CM

## 2022-03-30 DIAGNOSIS — K219 Gastro-esophageal reflux disease without esophagitis: Secondary | ICD-10-CM

## 2022-03-30 DIAGNOSIS — I161 Hypertensive emergency: Secondary | ICD-10-CM

## 2022-03-30 DIAGNOSIS — I5032 Chronic diastolic (congestive) heart failure: Secondary | ICD-10-CM | POA: Diagnosis not present

## 2022-03-30 DIAGNOSIS — K7469 Other cirrhosis of liver: Secondary | ICD-10-CM | POA: Diagnosis present

## 2022-03-30 DIAGNOSIS — J9621 Acute and chronic respiratory failure with hypoxia: Secondary | ICD-10-CM | POA: Diagnosis not present

## 2022-03-30 DIAGNOSIS — Z720 Tobacco use: Secondary | ICD-10-CM | POA: Diagnosis present

## 2022-03-30 DIAGNOSIS — Z803 Family history of malignant neoplasm of breast: Secondary | ICD-10-CM

## 2022-03-30 DIAGNOSIS — G40001 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 with seizures of localized onset, not intractable, with status epilepticus: Secondary | ICD-10-CM

## 2022-03-30 DIAGNOSIS — Z7902 Long term (current) use of antithrombotics/antiplatelets: Secondary | ICD-10-CM

## 2022-03-30 DIAGNOSIS — K7460 Unspecified cirrhosis of liver: Secondary | ICD-10-CM

## 2022-03-30 DIAGNOSIS — Z96611 Presence of right artificial shoulder joint: Secondary | ICD-10-CM | POA: Diagnosis present

## 2022-03-30 DIAGNOSIS — J9620 Acute and chronic respiratory failure, unspecified whether with hypoxia or hypercapnia: Secondary | ICD-10-CM | POA: Diagnosis present

## 2022-03-30 DIAGNOSIS — J441 Chronic obstructive pulmonary disease with (acute) exacerbation: Secondary | ICD-10-CM | POA: Diagnosis not present

## 2022-03-30 DIAGNOSIS — G629 Polyneuropathy, unspecified: Secondary | ICD-10-CM

## 2022-03-30 DIAGNOSIS — Z6830 Body mass index (BMI) 30.0-30.9, adult: Secondary | ICD-10-CM

## 2022-03-30 DIAGNOSIS — N390 Urinary tract infection, site not specified: Secondary | ICD-10-CM

## 2022-03-30 DIAGNOSIS — F17210 Nicotine dependence, cigarettes, uncomplicated: Secondary | ICD-10-CM | POA: Diagnosis present

## 2022-03-30 DIAGNOSIS — Z8673 Personal history of transient ischemic attack (TIA), and cerebral infarction without residual deficits: Secondary | ICD-10-CM

## 2022-03-30 DIAGNOSIS — Z881 Allergy status to other antibiotic agents status: Secondary | ICD-10-CM

## 2022-03-30 DIAGNOSIS — R569 Unspecified convulsions: Secondary | ICD-10-CM

## 2022-03-30 DIAGNOSIS — E1140 Type 2 diabetes mellitus with diabetic neuropathy, unspecified: Secondary | ICD-10-CM | POA: Diagnosis present

## 2022-03-30 DIAGNOSIS — Z886 Allergy status to analgesic agent status: Secondary | ICD-10-CM

## 2022-03-30 DIAGNOSIS — E876 Hypokalemia: Secondary | ICD-10-CM | POA: Diagnosis present

## 2022-03-30 DIAGNOSIS — Z7984 Long term (current) use of oral hypoglycemic drugs: Secondary | ICD-10-CM

## 2022-03-30 DIAGNOSIS — Z79899 Other long term (current) drug therapy: Secondary | ICD-10-CM

## 2022-03-30 DIAGNOSIS — Z885 Allergy status to narcotic agent status: Secondary | ICD-10-CM

## 2022-03-30 DIAGNOSIS — F32A Depression, unspecified: Secondary | ICD-10-CM | POA: Diagnosis present

## 2022-03-30 DIAGNOSIS — J9622 Acute and chronic respiratory failure with hypercapnia: Secondary | ICD-10-CM

## 2022-03-30 DIAGNOSIS — I350 Nonrheumatic aortic (valve) stenosis: Secondary | ICD-10-CM | POA: Diagnosis present

## 2022-03-30 DIAGNOSIS — Z20822 Contact with and (suspected) exposure to covid-19: Secondary | ICD-10-CM | POA: Diagnosis present

## 2022-03-30 DIAGNOSIS — B961 Klebsiella pneumoniae [K. pneumoniae] as the cause of diseases classified elsewhere: Secondary | ICD-10-CM | POA: Diagnosis present

## 2022-03-30 DIAGNOSIS — G2581 Restless legs syndrome: Secondary | ICD-10-CM | POA: Diagnosis present

## 2022-03-30 DIAGNOSIS — M069 Rheumatoid arthritis, unspecified: Secondary | ICD-10-CM | POA: Diagnosis present

## 2022-03-30 DIAGNOSIS — E119 Type 2 diabetes mellitus without complications: Secondary | ICD-10-CM

## 2022-03-30 DIAGNOSIS — Z90710 Acquired absence of both cervix and uterus: Secondary | ICD-10-CM

## 2022-03-30 DIAGNOSIS — I10 Essential (primary) hypertension: Secondary | ICD-10-CM

## 2022-03-30 DIAGNOSIS — I160 Hypertensive urgency: Secondary | ICD-10-CM | POA: Diagnosis present

## 2022-03-30 DIAGNOSIS — Z880 Allergy status to penicillin: Secondary | ICD-10-CM

## 2022-03-30 DIAGNOSIS — T426X6A Underdosing of other antiepileptic and sedative-hypnotic drugs, initial encounter: Secondary | ICD-10-CM | POA: Diagnosis present

## 2022-03-30 DIAGNOSIS — Z8249 Family history of ischemic heart disease and other diseases of the circulatory system: Secondary | ICD-10-CM

## 2022-03-30 DIAGNOSIS — E669 Obesity, unspecified: Secondary | ICD-10-CM | POA: Diagnosis present

## 2022-03-30 DIAGNOSIS — Z66 Do not resuscitate: Secondary | ICD-10-CM | POA: Diagnosis present

## 2022-03-30 DIAGNOSIS — J810 Acute pulmonary edema: Secondary | ICD-10-CM | POA: Diagnosis present

## 2022-03-30 DIAGNOSIS — G40909 Epilepsy, unspecified, not intractable, without status epilepticus: Secondary | ICD-10-CM

## 2022-03-30 DIAGNOSIS — G4730 Sleep apnea, unspecified: Secondary | ICD-10-CM | POA: Diagnosis present

## 2022-03-30 DIAGNOSIS — Z801 Family history of malignant neoplasm of trachea, bronchus and lung: Secondary | ICD-10-CM

## 2022-03-30 DIAGNOSIS — Z9049 Acquired absence of other specified parts of digestive tract: Secondary | ICD-10-CM

## 2022-03-30 DIAGNOSIS — G40101 Localization-related (focal) (partial) symptomatic epilepsy and epileptic syndromes with simple partial seizures, not intractable, with status epilepticus: Secondary | ICD-10-CM | POA: Diagnosis present

## 2022-03-30 LAB — COMPREHENSIVE METABOLIC PANEL
ALT: 11 U/L (ref 0–44)
AST: 19 U/L (ref 15–41)
Albumin: 3.4 g/dL — ABNORMAL LOW (ref 3.5–5.0)
Alkaline Phosphatase: 83 U/L (ref 38–126)
Anion gap: 6 (ref 5–15)
BUN: 8 mg/dL (ref 8–23)
CO2: 25 mmol/L (ref 22–32)
Calcium: 8.7 mg/dL — ABNORMAL LOW (ref 8.9–10.3)
Chloride: 112 mmol/L — ABNORMAL HIGH (ref 98–111)
Creatinine, Ser: 0.56 mg/dL (ref 0.44–1.00)
GFR, Estimated: >60 mL/min (ref >60)
Glucose, Bld: 114 mg/dL — ABNORMAL HIGH (ref 70–99)
Potassium: 3.4 mmol/L — ABNORMAL LOW (ref 3.5–5.1)
Sodium: 143 mmol/L (ref 135–145)
Total Bilirubin: 0.8 mg/dL (ref 0.3–1.2)
Total Protein: 6.4 g/dL — ABNORMAL LOW (ref 6.5–8.1)

## 2022-03-30 LAB — CBC WITH DIFFERENTIAL/PLATELET
Abs Immature Granulocytes: 0.03 10*3/uL (ref 0.00–0.07)
Basophils Absolute: 0 10*3/uL (ref 0.0–0.1)
Basophils Relative: 0 %
Eosinophils Absolute: 0.1 10*3/uL (ref 0.0–0.5)
Eosinophils Relative: 2 %
HCT: 38.7 % (ref 36.0–46.0)
Hemoglobin: 12.2 g/dL (ref 12.0–15.0)
Immature Granulocytes: 0 %
Lymphocytes Relative: 28 %
Lymphs Abs: 2.2 10*3/uL (ref 0.7–4.0)
MCH: 26.6 pg (ref 26.0–34.0)
MCHC: 31.5 g/dL (ref 30.0–36.0)
MCV: 84.3 fL (ref 80.0–100.0)
Monocytes Absolute: 0.6 10*3/uL (ref 0.1–1.0)
Monocytes Relative: 8 %
Neutro Abs: 4.9 10*3/uL (ref 1.7–7.7)
Neutrophils Relative %: 62 %
Platelets: 189 10*3/uL (ref 150–400)
RBC: 4.59 MIL/uL (ref 3.87–5.11)
RDW: 14 % (ref 11.5–15.5)
WBC: 7.9 10*3/uL (ref 4.0–10.5)
nRBC: 0 % (ref 0.0–0.2)

## 2022-03-30 LAB — BLOOD GAS, ARTERIAL
Acid-base deficit: 0.7 mmol/L (ref 0.0–2.0)
Bicarbonate: 25.8 mmol/L (ref 20.0–28.0)
Delivery systems: POSITIVE
Expiratory PAP: 8 cmH2O
FIO2: 50 %
Inspiratory PAP: 12 cmH2O
O2 Saturation: 99.9 %
Patient temperature: 37
RATE: 12 resp/min
pCO2 arterial: 49 mmHg — ABNORMAL HIGH (ref 32–48)
pH, Arterial: 7.33 — ABNORMAL LOW (ref 7.35–7.45)
pO2, Arterial: 158 mmHg — ABNORMAL HIGH (ref 83–108)

## 2022-03-30 LAB — GLUCOSE, CAPILLARY
Glucose-Capillary: 233 mg/dL — ABNORMAL HIGH (ref 70–99)
Glucose-Capillary: 218 mg/dL — ABNORMAL HIGH (ref 70–99)

## 2022-03-30 LAB — CBC
HCT: 38.4 % (ref 36.0–46.0)
Hemoglobin: 12.4 g/dL (ref 12.0–15.0)
MCH: 26.8 pg (ref 26.0–34.0)
MCHC: 32.3 g/dL (ref 30.0–36.0)
MCV: 82.9 fL (ref 80.0–100.0)
Platelets: 173 10*3/uL (ref 150–400)
RBC: 4.63 MIL/uL (ref 3.87–5.11)
RDW: 13.9 % (ref 11.5–15.5)
WBC: 6.7 10*3/uL (ref 4.0–10.5)
nRBC: 0 % (ref 0.0–0.2)

## 2022-03-30 LAB — TROPONIN I (HIGH SENSITIVITY): Troponin I (High Sensitivity): 12 ng/L (ref <18)

## 2022-03-30 LAB — LACTIC ACID, PLASMA: Lactic Acid, Venous: 1 mmol/L (ref 0.5–1.9)

## 2022-03-30 LAB — BRAIN NATRIURETIC PEPTIDE: B Natriuretic Peptide: 603.5 pg/mL — ABNORMAL HIGH (ref 0.0–100.0)

## 2022-03-30 LAB — CREATININE, SERUM
Creatinine, Ser: 0.62 mg/dL (ref 0.44–1.00)
GFR, Estimated: >60 mL/min (ref >60)

## 2022-03-30 LAB — SARS CORONAVIRUS 2 BY RT PCR: SARS Coronavirus 2 by RT PCR: NEGATIVE

## 2022-03-30 MED ORDER — LEVETIRACETAM IN NACL 1000 MG/100ML IV SOLN
1000.0000 mg | Freq: Once | INTRAVENOUS | Status: AC
  Administered 2022-03-30: 1000 mg via INTRAVENOUS
  Filled 2022-03-30: qty 100

## 2022-03-30 MED ORDER — INSULIN ASPART 100 UNIT/ML IJ SOLN
0.0000 [IU] | Freq: Three times a day (TID) | INTRAMUSCULAR | Status: DC
  Administered 2022-03-30: 5 [IU] via SUBCUTANEOUS
  Administered 2022-03-31: 8 [IU] via SUBCUTANEOUS
  Administered 2022-03-31: 5 [IU] via SUBCUTANEOUS
  Administered 2022-04-01 (×2): 3 [IU] via SUBCUTANEOUS
  Administered 2022-04-01 – 2022-04-02 (×2): 2 [IU] via SUBCUTANEOUS
  Administered 2022-04-02 (×2): 3 [IU] via SUBCUTANEOUS
  Filled 2022-03-30 (×9): qty 1

## 2022-03-30 MED ORDER — MONTELUKAST SODIUM 10 MG PO TABS
10.0000 mg | ORAL_TABLET | Freq: Every day | ORAL | Status: DC
  Administered 2022-03-30 – 2022-04-01 (×2): 10 mg via ORAL
  Filled 2022-03-30 (×2): qty 1

## 2022-03-30 MED ORDER — CITALOPRAM HYDROBROMIDE 20 MG PO TABS
10.0000 mg | ORAL_TABLET | Freq: Every day | ORAL | Status: DC
  Administered 2022-03-30 – 2022-04-01 (×3): 10 mg via ORAL
  Filled 2022-03-30 (×4): qty 1

## 2022-03-30 MED ORDER — PANTOPRAZOLE SODIUM 40 MG PO TBEC
40.0000 mg | DELAYED_RELEASE_TABLET | Freq: Every day | ORAL | Status: DC
  Administered 2022-03-30 – 2022-04-01 (×3): 40 mg via ORAL
  Filled 2022-03-30 (×4): qty 1

## 2022-03-30 MED ORDER — ONDANSETRON HCL 4 MG/2ML IJ SOLN: 4.0000 mg | Freq: Four times a day (QID) | INTRAMUSCULAR | Status: DC | PRN

## 2022-03-30 MED ORDER — IPRATROPIUM-ALBUTEROL 0.5-2.5 (3) MG/3ML IN SOLN
3.0000 mL | Freq: Two times a day (BID) | RESPIRATORY_TRACT | Status: DC
  Administered 2022-03-31 – 2022-04-01 (×3): 3 mL via RESPIRATORY_TRACT
  Filled 2022-03-30 (×3): qty 3

## 2022-03-30 MED ORDER — IPRATROPIUM-ALBUTEROL 0.5-2.5 (3) MG/3ML IN SOLN
3.0000 mL | Freq: Once | RESPIRATORY_TRACT | Status: AC
  Administered 2022-03-30: 3 mL via RESPIRATORY_TRACT
  Filled 2022-03-30: qty 3

## 2022-03-30 MED ORDER — ALBUTEROL SULFATE (2.5 MG/3ML) 0.083% IN NEBU
2.5000 mg | INHALATION_SOLUTION | Freq: Once | RESPIRATORY_TRACT | Status: AC
  Administered 2022-03-30: 2.5 mg via RESPIRATORY_TRACT
  Filled 2022-03-30: qty 3

## 2022-03-30 MED ORDER — METHYLPREDNISOLONE SODIUM SUCC 40 MG IJ SOLR
40.0000 mg | Freq: Two times a day (BID) | INTRAMUSCULAR | Status: AC
  Administered 2022-03-30 – 2022-03-31 (×2): 40 mg via INTRAVENOUS
  Filled 2022-03-30 (×2): qty 1

## 2022-03-30 MED ORDER — SPIRONOLACTONE 25 MG PO TABS
25.0000 mg | ORAL_TABLET | Freq: Every day | ORAL | Status: DC
  Administered 2022-03-31 – 2022-04-01 (×2): 25 mg via ORAL
  Filled 2022-03-30 (×3): qty 1

## 2022-03-30 MED ORDER — ONDANSETRON HCL 4 MG/2ML IJ SOLN
4.0000 mg | Freq: Once | INTRAMUSCULAR | Status: AC
  Administered 2022-03-30: 4 mg via INTRAVENOUS

## 2022-03-30 MED ORDER — IPRATROPIUM-ALBUTEROL 0.5-2.5 (3) MG/3ML IN SOLN
3.0000 mL | RESPIRATORY_TRACT | Status: DC
  Administered 2022-03-30 (×2): 3 mL via RESPIRATORY_TRACT
  Filled 2022-03-30 (×2): qty 3

## 2022-03-30 MED ORDER — POLYETHYLENE GLYCOL 3350 17 G PO PACK: 17.0000 g | PACK | Freq: Every day | ORAL | Status: DC | PRN

## 2022-03-30 MED ORDER — LORAZEPAM 2 MG/ML IJ SOLN
INTRAMUSCULAR | Status: AC
  Filled 2022-03-30: qty 1

## 2022-03-30 MED ORDER — FUROSEMIDE 10 MG/ML IJ SOLN
40.0000 mg | Freq: Once | INTRAMUSCULAR | Status: AC
  Administered 2022-03-30: 40 mg via INTRAVENOUS
  Filled 2022-03-30: qty 4

## 2022-03-30 MED ORDER — LACOSAMIDE 50 MG PO TABS
150.0000 mg | ORAL_TABLET | Freq: Two times a day (BID) | ORAL | Status: DC
  Administered 2022-03-30 – 2022-03-31 (×2): 150 mg via ORAL
  Filled 2022-03-30 (×2): qty 3

## 2022-03-30 MED ORDER — POTASSIUM CHLORIDE CRYS ER 20 MEQ PO TBCR
20.0000 meq | EXTENDED_RELEASE_TABLET | Freq: Every day | ORAL | Status: DC
  Administered 2022-03-31 – 2022-04-01 (×2): 20 meq via ORAL
  Filled 2022-03-30 (×3): qty 1

## 2022-03-30 MED ORDER — ACETAMINOPHEN 325 MG PO TABS: 650.0000 mg | ORAL_TABLET | Freq: Four times a day (QID) | ORAL | Status: DC | PRN

## 2022-03-30 MED ORDER — AZITHROMYCIN 250 MG PO TABS
250.0000 mg | ORAL_TABLET | Freq: Every day | ORAL | Status: DC
  Administered 2022-03-31 – 2022-04-01 (×2): 250 mg via ORAL
  Filled 2022-03-30 (×3): qty 1

## 2022-03-30 MED ORDER — FLUTICASONE PROPIONATE 50 MCG/ACT NA SUSP
1.0000 | Freq: Two times a day (BID) | NASAL | Status: DC
  Administered 2022-03-31 – 2022-04-01 (×2): 1 via NASAL
  Filled 2022-03-30 (×2): qty 16

## 2022-03-30 MED ORDER — CLOPIDOGREL BISULFATE 75 MG PO TABS
75.0000 mg | ORAL_TABLET | Freq: Every day | ORAL | Status: DC
  Administered 2022-03-30 – 2022-04-01 (×3): 75 mg via ORAL
  Filled 2022-03-30 (×4): qty 1

## 2022-03-30 MED ORDER — INSULIN ASPART 100 UNIT/ML IJ SOLN
0.0000 [IU] | Freq: Every day | INTRAMUSCULAR | Status: DC
  Administered 2022-03-30 – 2022-04-01 (×2): 2 [IU] via SUBCUTANEOUS
  Filled 2022-03-30 (×2): qty 1

## 2022-03-30 MED ORDER — ENOXAPARIN SODIUM 40 MG/0.4ML IJ SOSY
40.0000 mg | PREFILLED_SYRINGE | INTRAMUSCULAR | Status: DC
  Administered 2022-03-30 – 2022-04-01 (×3): 40 mg via SUBCUTANEOUS
  Filled 2022-03-30 (×3): qty 0.4

## 2022-03-30 MED ORDER — PREDNISONE 20 MG PO TABS
40.0000 mg | ORAL_TABLET | Freq: Every day | ORAL | Status: DC
  Administered 2022-03-31 – 2022-04-01 (×2): 40 mg via ORAL
  Filled 2022-03-30 (×3): qty 2

## 2022-03-30 MED ORDER — LORAZEPAM 2 MG/ML IJ SOLN
1.0000 mg | INTRAMUSCULAR | Status: AC | PRN
  Administered 2022-03-31 (×3): 1 mg via INTRAVENOUS
  Filled 2022-03-30 (×3): qty 1

## 2022-03-30 MED ORDER — NICOTINE 21 MG/24HR TD PT24
21.0000 mg | MEDICATED_PATCH | Freq: Every day | TRANSDERMAL | Status: DC
  Administered 2022-03-30 – 2022-04-02 (×4): 21 mg via TRANSDERMAL
  Filled 2022-03-30 (×4): qty 1

## 2022-03-30 MED ORDER — LORAZEPAM 2 MG/ML IJ SOLN
1.0000 mg | Freq: Once | INTRAMUSCULAR | Status: AC
  Administered 2022-03-30: 1 mg via INTRAVENOUS

## 2022-03-30 MED ORDER — LOSARTAN POTASSIUM 50 MG PO TABS
100.0000 mg | ORAL_TABLET | Freq: Every day | ORAL | Status: DC
  Administered 2022-03-31 – 2022-04-01 (×2): 100 mg via ORAL
  Filled 2022-03-30 (×3): qty 2

## 2022-03-30 MED ORDER — AZITHROMYCIN 250 MG PO TABS
500.0000 mg | ORAL_TABLET | Freq: Every day | ORAL | Status: AC
  Administered 2022-03-30: 500 mg via ORAL
  Filled 2022-03-30: qty 2

## 2022-03-30 MED ORDER — ACETAMINOPHEN 650 MG RE SUPP: 650.0000 mg | Freq: Four times a day (QID) | RECTAL | Status: DC | PRN

## 2022-03-30 MED ORDER — MAGNESIUM SULFATE 2 GM/50ML IV SOLN
2.0000 g | Freq: Once | INTRAVENOUS | Status: AC
  Administered 2022-03-30: 2 g via INTRAVENOUS

## 2022-03-30 MED ORDER — GABAPENTIN 100 MG PO CAPS
100.0000 mg | ORAL_CAPSULE | Freq: Two times a day (BID) | ORAL | Status: DC
  Administered 2022-03-30 – 2022-04-01 (×4): 100 mg via ORAL
  Filled 2022-03-30 (×5): qty 1

## 2022-03-30 MED ORDER — FUROSEMIDE 20 MG PO TABS
40.0000 mg | ORAL_TABLET | Freq: Every day | ORAL | Status: DC
  Administered 2022-03-31 – 2022-04-01 (×2): 40 mg via ORAL
  Filled 2022-03-30 (×2): qty 2
  Filled 2022-03-30: qty 1

## 2022-03-30 MED ORDER — TIOTROPIUM BROMIDE MONOHYDRATE 18 MCG IN CAPS
1.0000 | ORAL_CAPSULE | Freq: Every day | RESPIRATORY_TRACT | Status: DC
  Administered 2022-03-31: 18 ug via RESPIRATORY_TRACT
  Filled 2022-03-30 (×2): qty 5

## 2022-03-30 MED ORDER — ONDANSETRON HCL 4 MG PO TABS: 4.0000 mg | ORAL_TABLET | Freq: Four times a day (QID) | ORAL | Status: DC | PRN

## 2022-03-30 MED ORDER — ROSUVASTATIN CALCIUM 10 MG PO TABS
10.0000 mg | ORAL_TABLET | Freq: Every day | ORAL | Status: DC
  Administered 2022-03-30 – 2022-04-01 (×2): 10 mg via ORAL
  Filled 2022-03-30 (×2): qty 1

## 2022-03-30 MED ORDER — ONDANSETRON HCL 4 MG/2ML IJ SOLN
INTRAMUSCULAR | Status: AC
  Filled 2022-03-30: qty 2

## 2022-03-30 MED ORDER — METHYLPREDNISOLONE SODIUM SUCC 125 MG IJ SOLR
125.0000 mg | Freq: Once | INTRAMUSCULAR | Status: AC
  Administered 2022-03-30: 125 mg via INTRAVENOUS
  Filled 2022-03-30: qty 2

## 2022-03-30 NOTE — ED Provider Notes (Signed)
[facility] Provider Note    Event Date/Time   First MD Initiated Contact with Patient 03/30/22 [phone]     (approximate)   History   COPD   HPI  [name] is a 71 y.o. female with past medical history of heart failure, COPD, diabetes, seizures, cirrhosis, here with shortness of breath.  Patient's primary complaint is shortness of breath and wheezing.  She states that she has been wheezing and dyspneic for the last week or so.  She has had significant difficulty eating around the house.  She does not normally wear oxygen during the day, only at night, but she has been wearing it essentially nonstop.  Over the last 24 hours, shortness of breath is significantly worsened.  She also states that she has been bringing up occasional sputum.  No known fevers.  She has been using albuterol without significant relief.  No known sick contacts.     Physical Exam   Triage Vital Signs: ED Triage Vitals  Enc Vitals Group     BP --      Pulse Rate 03/30/22 0719 (!) 58     Resp 03/30/22 0719 20     Temp 03/30/22 0719 98.1 F (36.7 C)     Temp Source 03/30/22 0719 Oral     SpO2 03/30/22 0719 94 %     Weight 03/30/22 0722 163 lb 2.3 oz (74 kg)     Height 03/30/22 0722 _0  (1.6 m)     Head Circumference --      Peak Flow --      Pain Score 03/30/22 0722 3     Pain Loc --      Pain Edu? --      Excl. in [place]? --     Most recent vital signs: Vitals:   03/30/22 0930 03/30/22 0951  BP: (!) 132/55 (!) 124/55  Pulse: 68 60  Resp: (!) 26 (!) 21  Temp:    SpO2: 98% 96%     General: Awake, mild respiratory distress, sitting upright. CV:  Good peripheral perfusion.  Regular rate and rhythm. Resp:  Increased work of breathing with diffuse wheezing, speaking in short sentences. Abd:  No distention.  Other:  Alert, oriented.  Hard of hearing.  Tone normal.   ED Results / Procedures / Treatments   Labs (all labs ordered are listed, but only abnormal results  are displayed) Labs Reviewed  COMPREHENSIVE METABOLIC PANEL - Abnormal; Notable for the following components:      Result Value   Potassium 3.4 (*)    Chloride 112 (*)    Glucose, Bld 114 (*)    Calcium 8.7 (*)    Total Protein 6.4 (*)    Albumin 3.4 (*)    All other components within normal limits  BRAIN NATRIURETIC PEPTIDE - Abnormal; Notable for the following components:   B Natriuretic Peptide 603.5 (*)    All other components within normal limits  BLOOD GAS, ARTERIAL - Abnormal; Notable for the following components:   pH, Arterial 7.33 (*)    pCO2 arterial 49 (*)    pO2, Arterial 158 (*)    All other components within normal limits  SARS CORONAVIRUS 2 BY RT PCR  CBC WITH DIFFERENTIAL/PLATELET  LACTIC ACID, PLASMA  TROPONIN I (HIGH SENSITIVITY)     EKG Normal sinus rhythm, ventricular rate 56.  PR 155, QRS 94, QTc 470.  No acute ST elevations or depressions.  No EKG was of  acute ischemia or infarct.   RADIOLOGY Chest x-ray: Mild congestive heart failure, atherosclerosis   I also independently reviewed and agree with radiologist interpretations.   PROCEDURES:  Critical Care performed: Yes, see critical care procedure note(s)  .Critical Care  Performed by: Duffy Bruce, MD Authorized by: Duffy Bruce, MD   Critical care provider statement:    Critical care time (minutes):  30   Critical care time was exclusive of:  Separately billable procedures and treating other patients   Critical care was necessary to treat or prevent imminent or life-threatening deterioration of the following conditions:  Cardiac failure, circulatory failure and respiratory failure   Critical care was time spent personally by me on the following activities:  Development of treatment plan with patient or surrogate, discussions with consultants, evaluation of patient's response to treatment, examination of patient, ordering and review of laboratory studies, ordering and review of  radiographic studies, ordering and performing treatments and interventions, pulse oximetry, re-evaluation of patient's condition and review of old charts     MEDICATIONS ORDERED IN ED: Medications  methylPREDNISolone sodium succinate (SOLU-MEDROL) 125 mg/2 mL injection 125 mg (125 mg Intravenous Given 03/30/22 0746)  ipratropium-albuterol (DUONEB) 0.5-2.5 (3) MG/3ML nebulizer solution 3 mL (3 mLs Nebulization Given 03/30/22 0747)  ipratropium-albuterol (DUONEB) 0.5-2.5 (3) MG/3ML nebulizer solution 3 mL (3 mLs Nebulization Given 03/30/22 0746)  albuterol (PROVENTIL) (2.5 MG/3ML) 0.083% nebulizer solution 2.5 mg (2.5 mg Nebulization Given 03/30/22 0746)  magnesium sulfate IVPB 2 g 50 mL (0 g Intravenous Stopped 03/30/22 0849)  LORazepam (ATIVAN) injection 1 mg (1 mg Intravenous Given 03/30/22 0850)  ondansetron (ZOFRAN) injection 4 mg (4 mg Intravenous Given 03/30/22 0857)  furosemide (LASIX) injection 40 mg (40 mg Intravenous Given 03/30/22 0859)  levETIRAcetam (KEPPRA) IVPB 1000 mg/100 mL premix (1,000 mg Intravenous New Bag/Given 03/30/22 0901)  levETIRAcetam (KEPPRA) IVPB 1000 mg/100 mL premix (1,000 mg Intravenous New Bag/Given 03/30/22 0901)     IMPRESSION / MDM / [place] / ED COURSE  I reviewed the triage vital signs and the nursing notes.               Ddx:  Differential includes the following, with pertinent life- or limb-threatening emergencies considered:  COPD exacerbation, CAP, CHF, PTX, ACS, PE, anemia, aspiration  Patient's presentation is most consistent with acute presentation with potential threat to life or bodily function.  MDM:  71 year old female here with cough, shortness of breath, wheezing.  Patient significantly dyspneic with diffuse wheezing on exam.  Patient also noted to have a brief, generalized, seizure.  This is not abnormal for her when she is sick.  Patient given Ativan as well as a Keppra load with improvement in her mental status.  She was placed  on BiPAP for work of breathing.  While exam fits much more with COPD with wheezing, chest x-ray does show possible concomitant CHF.  BNP is elevated at 603.  Troponin normal.  No ischemic changes on EKG.  Lactic acid is normal.  Seizing activity has stopped.  Will admit to medicine, likely stepdown.  Patient seems to be improving clinically.  Blood gas without significant retention.  Will admit to medicine. IV steroids, nebs, keppra given.  MEDICATIONS GIVEN IN ED: Medications  methylPREDNISolone sodium succinate (SOLU-MEDROL) 125 mg/2 mL injection 125 mg (125 mg Intravenous Given 03/30/22 0746)  ipratropium-albuterol (DUONEB) 0.5-2.5 (3) MG/3ML nebulizer solution 3 mL (3 mLs Nebulization Given 03/30/22 0747)  ipratropium-albuterol (DUONEB) 0.5-2.5 (3) MG/3ML nebulizer solution 3 mL (3 mLs Nebulization  Given 03/30/22 0746)  albuterol (PROVENTIL) (2.5 MG/3ML) 0.083% nebulizer solution 2.5 mg (2.5 mg Nebulization Given 03/30/22 0746)  magnesium sulfate IVPB 2 g 50 mL (0 g Intravenous Stopped 03/30/22 0849)  LORazepam (ATIVAN) injection 1 mg (1 mg Intravenous Given 03/30/22 0850)  ondansetron (ZOFRAN) injection 4 mg (4 mg Intravenous Given 03/30/22 0857)  furosemide (LASIX) injection 40 mg (40 mg Intravenous Given 03/30/22 0859)  levETIRAcetam (KEPPRA) IVPB 1000 mg/100 mL premix (1,000 mg Intravenous New Bag/Given 03/30/22 0901)  levETIRAcetam (KEPPRA) IVPB 1000 mg/100 mL premix (1,000 mg Intravenous New Bag/Given 03/30/22 0901)     Consults:  Hospitalist   EMR reviewed  Prior ED records, prior H&P from admission for PNA     FINAL CLINICAL IMPRESSION(S) / ED DIAGNOSES   Final diagnoses:  COPD exacerbation ([place])  Seizures ([place])  Acute on chronic respiratory failure with hypoxia ([place])  Acute on chronic congestive heart failure, unspecified heart failure type ([place])     Rx / DC Orders   ED Discharge Orders     None        Note:  This document was prepared using Dragon voice  recognition software and may include unintentional dictation errors.   Duffy Bruce, MD 03/30/22 [phone]

## 2022-03-30 NOTE — ED Notes (Addendum)
Pt repositioned in bed, purewick placed post Lasix administration. Pt alert and speaking to RN at this time.

## 2022-03-30 NOTE — Assessment & Plan Note (Signed)
Current smoker of 1 pack/day. -Nicotine patch

## 2022-03-30 NOTE — Hospital Course (Addendum)
[name] is a 71 y.o. female with past medical history of heart failure, COPD, diabetes, seizures, cirrhosis, here with shortness of breath, gradually worsening over the past couple of week to the point that it really get worse yesterday and she was not being able to speak in full sentences despite using home oxygen all the time, at baseline she was mostly using at night and occasionally with ambulation.  Patient was little somnolent after getting Ativan for seizure during admission.  Most of the history was obtained from son on phone.  Per son patient was also having worsening cough with some sputum production, no fever or chills. Some decrease in appetite for the past couple of weeks but she was able to eat dinner with the family.  No chest pain.  No urinary symptoms.  ED course.  Patient has hemodynamically stable on arrival.  CBC and CMP without any significant abnormality.  Troponin negative.  BNP at 603.5. COVID and influenza PCR negative. Chest x-ray with concern of mild congestion. CT head with no acute abnormalities.  Patient developed a witnessed seizure while in the ED during that time her blood pressure increased above 721 systolic.  She was given a Keppra load and Ativan.  Apparently missed her morning dose of Vimpat.  She was also given Solu-Medrol and DuoNeb. Placed on BiPAP for concern of hypercarbia and decreased mental status with seizure.  Able to wean her back to 4 L of oxygen.  She was admitted for acute on chronic respiratory failure most likely secondary to COPD exacerbation.  9/25: Patient continued to have seizures despite Keppra load in ED and restarting home Vimpat.  Neurology was consulted and they ordered Vimpat load. Had 3 seizures since this morning requiring Ativan. Patient was somnolent when seen today during morning rounds as she received second dose of Ativan recently. UA positive for nitrites and bacteria.  No leukocytes-added culture.  Holding  antibiotics for now.  9/26: Patient continued to have dozing on and off episodes where occasionally noted some shaking.  She all of a sudden stopped responding.  Received a dose of Ativan.  Because of multiple consecutive episodes there was a concern of status epilepticus versus continuation of underlying frequent nonclinical seizures, neurology increase the dose of Keppra and Vimpat and she was reloaded with Keppra. They are recommending transferring to [facility] for continuous EEG as EEG done yesterday did not recorded any seizure activity.  [facility] was contacted and patient is being transferred to [facility] for further care. She will continue on current management. Urine cultures pending.

## 2022-03-30 NOTE — Assessment & Plan Note (Addendum)
Patient developed flash pulmonary edema most likely with significantly elevated blood pressure with seizure. Received one-time dose of IV Lasix. Chest clear on exam.  Troponin negative. Echocardiogram done and March 2023 with grade 1 diastolic dysfunction and mild to moderate aortic stenosis. BNP elevated at 603. -Continue home Lasix and spironolactone

## 2022-03-30 NOTE — ED Notes (Signed)
Per request of hospitalist, pt taken off of bipap and placed on 4LNC per instruction of RT.

## 2022-03-30 NOTE — Progress Notes (Signed)
CROSS COVER NOTE  NAME: [name] MRN: [id number] DOB : 11-20-1950    Date of Service   03/30/2022   HPI/Events of Note   Rapid Response called for seizure activity described as head shaking side to side followed by patient becoming unresponsive.   [name] is a 71 yo F with PMH heart failure, COPD, diabetes, seizures, cirrhosis who presented to [facility] ED with shortness of breath and is being treated for COPD exacerbation. She had a seizure in [facility] ED prior to admission that terminated with 30m of Ativan. Per chart review patient has breakthrough seizures when ill.  On arrival to bedside seizure had terminated and patient was post-ictal, patient protecting airway and vital signs stable. BP 123/86 HR 65 RR 20 SPO2 95% on 4L via [place] Temp 36.8C  Interventions   Assessment/Plan:  Breakthrough Seizure PRN Ativan Continue Vimpat     This document was prepared using Dragon voice recognition software and may include unintentional dictation errors.  KNeomia GlassDNP, MHA, FNP-BC Nurse Practitioner [facility]Pager ([phone]

## 2022-03-30 NOTE — ED Triage Notes (Signed)
Pt from home via ems, reports that she has been having worsening SOB over past week with hx of COPD. Sats were 95% on RA when ems arrived.

## 2022-03-30 NOTE — Assessment & Plan Note (Signed)
CBG currently within goal.  Uses glipizide at home. -Check A1c -SSI -Monitor closely as she is being getting steroid

## 2022-03-30 NOTE — Assessment & Plan Note (Signed)
Appears well compensated. -Continue to monitor

## 2022-03-30 NOTE — Assessment & Plan Note (Addendum)
Resolved.  Magnesium within normal limit Patient is on potassium supplement at home. -Continue home potassium supplement at 20 mEq daily

## 2022-03-30 NOTE — ED Notes (Signed)
Pt stated that she needed to urinate. Pt at the time refused purewick and stated that she wanted to get up to toilet. Pt was assisted up and was minimal assist when suddenly she looked at this RN and stated that she was very hot, pt then began to have a blank stare with rapid eye blinking. This RN called for assistance from charge RN, pt was placed back onto bed, MD at bedside, appeared that pt was having a seizer. NRB placed d/ sats dropping to 87% pt was ordered IV ativan.

## 2022-03-30 NOTE — Assessment & Plan Note (Signed)
See above

## 2022-03-30 NOTE — H&P (Signed)
History and Physical    Patient: [name] XLK:[id number] DOB: 03-Feb-1951 DOA: 03/30/2022 DOS: the patient was seen and examined on 03/30/2022 PCP: [facility]  Patient coming from: Home  Chief Complaint:  Chief Complaint  Patient presents with   COPD   HPI: Karen CRAMMER is a 71 y.o. female with medical history significant of  heart failure, COPD, diabetes, seizures, cirrhosis, here with shortness of breath, gradually worsening over the past couple of week to the point that it really get worse yesterday and she was not being able to speak in full sentences despite using home oxygen all the time, at baseline she was mostly using at night and occasionally with ambulation.  Patient was little somnolent after getting Ativan for seizure during admission.  Most of the history was obtained from son on phone.  Per son patient was also having worsening cough with some sputum production, no fever or chills. Some decrease in appetite for the past couple of weeks but she was able to eat dinner with the family.  No chest pain.  No urinary symptoms.  ED course.  Patient has hemodynamically stable on arrival.  CBC and CMP without any significant abnormality.  Troponin negative.  BNP at 603.5. COVID and influenza PCR negative. Chest x-ray with concern of mild congestion. CT head with no acute abnormalities.  Patient developed a witnessed seizure while in the ED during that time her blood pressure increased above 366 systolic.  She was given a Keppra load and Ativan.  Apparently missed her morning dose of Vimpat.  She was also given Solu-Medrol and DuoNeb. Placed on BiPAP for concern of hypercarbia and decreased mental status with seizure.  Able to wean her back to 4 L of oxygen.  She was admitted for acute on chronic respiratory failure most likely secondary to COPD exacerbation.   Review of Systems: unable to review all systems due to the inability of the patient to  answer questions. Past Medical History:  Diagnosis Date   Asthma    CHF (congestive heart failure) (HCC)    Cirrhosis, non-alcoholic (HCC)    COPD (chronic obstructive pulmonary disease) ([place])    Deaf    Depression    Diabetes mellitus without complication (HCC)    GERD (gastroesophageal reflux disease)    Heart murmur    Hepatitis    History of rheumatic fever    History of scarlet fever    Hypertension    IBS (irritable bowel syndrome)    Lymph node disorder    arm   Neuropathy    On home oxygen therapy    hs   Orthopnea    Osteoarthritis    RA (rheumatoid arthritis) (HCC)    RLS (restless legs syndrome)    Seizures (HCC)    Shortness of breath dyspnea    Sleep apnea    Stroke ([place])    tia   Past Surgical History:  Procedure Laterality Date   ABDOMINAL HYSTERECTOMY     BREAST BIOPSY Right 10/30/2020   Stereo Bx, X-clip,  BENIGN BREAST TISSUE WITH FIBROADENOMATOUS   CATARACT EXTRACTION W/PHACO Right 11/23/2014   Procedure: CATARACT EXTRACTION PHACO AND INTRAOCULAR LENS PLACEMENT ([place]);  Surgeon: Lyla Glassing, MD;  Location: [facility] ORS;  Service: Ophthalmology;  Laterality: Right;   CATARACT EXTRACTION W/PHACO Left 12/14/2014   Procedure: CATARACT EXTRACTION PHACO AND INTRAOCULAR LENS PLACEMENT (IOC);  Surgeon: Lyla Glassing, MD;  Location: [facility] ORS;  Service: Ophthalmology;  Laterality: Left;  US:01:16.6 AP:15.8  CDE:12.14   CESAREAN SECTION     CHOLECYSTECTOMY     COLONOSCOPY WITH PROPOFOL N/A 10/08/2020   Procedure: COLONOSCOPY WITH PROPOFOL;  Surgeon: Toledo, Benay Pike, MD;  Location: [facility] ENDOSCOPY;  Service: Gastroenterology;  Laterality: N/A;   ESOPHAGOGASTRODUODENOSCOPY (EGD) WITH PROPOFOL N/A 10/08/2020   Procedure: ESOPHAGOGASTRODUODENOSCOPY (EGD) WITH PROPOFOL;  Surgeon: Toledo, Benay Pike, MD;  Location: [facility] ENDOSCOPY;  Service: Gastroenterology;  Laterality: N/A;  DM DEAF, NEEDS SIGN INTERPRETER PER SON   REVERSE SHOULDER ARTHROPLASTY Right 12/21/2019    Procedure: REVERSE SHOULDER ARTHROPLASTY;  Surgeon: Lovell Sheehan, MD;  Location: [facility] ORS;  Service: Orthopedics;  Laterality: Right;   THUMB ARTHROSCOPY     TONSILLECTOMY     TYMPANOPLASTY     muliple   Social History:  reports that she has been smoking cigarettes. She started smoking about 54 years ago. She has a 26.00 pack-year smoking history. She has never used smokeless tobacco. She reports that she does not drink alcohol and does not use drugs.  Allergies  Allergen Reactions   Aspirin Itching   Celebrex [Celecoxib] Itching    itching   Ciprofloxacin Itching   Codeine Itching   Fosphenytoin Itching   Levaquin [Levofloxacin In D5w] Itching   Levofloxacin Itching   Lovastatin Itching   Penicillins     Documentation indicates severe reaction  Pt tolerated cephalosporin without adverse reaction 09/18    Pravastatin Itching   Sulfa Antibiotics Itching    Family History  Problem Relation Age of Onset   Lung cancer Mother    CAD Father    Breast cancer Sister     Prior to Admission medications   Medication Sig Start Date End Date Taking? Authorizing Provider  citalopram (CELEXA) 10 MG tablet Take 10 mg by mouth daily.   Yes [provider]  clopidogrel (PLAVIX) 75 MG tablet Take 1 tablet (75 mg total) by mouth daily. 07/17/16  Yes Vaughan Basta, MD  furosemide (LASIX) 40 MG tablet Take 40 mg by mouth daily. 07/22/18  Yes [provider]  gabapentin (NEURONTIN) 100 MG capsule Take 100 mg by mouth 2 (two) times daily. 09/18/21  Yes [provider]  glimepiride (AMARYL) 2 MG tablet Take 2 mg by mouth daily.   Yes [provider]  ipratropium-albuterol (DUONEB) 0.5-2.5 (3) MG/3ML SOLN Take 3 mLs by nebulization in the morning, at noon, and at bedtime for 5 days. 12/04/21 03/30/22 Yes Lavina Hamman, MD  lacosamide 150 MG TABS Take 1 tablet (150 mg total) by mouth 2 (two) times daily. 08/01/20  Yes Hosie Poisson, MD  losartan  (COZAAR) 100 MG tablet Take 100 mg by mouth daily.   Yes [provider]  metoprolol succinate (TOPROL-XL) 25 MG 24 hr tablet Take 1 tablet (25 mg total) by mouth daily. 08/08/19  Yes Thornell Mule, MD  montelukast (SINGULAIR) 10 MG tablet Take 10 mg by mouth at bedtime.  03/18/16  Yes [provider]  pantoprazole (PROTONIX) 40 MG tablet Take 40 mg by mouth daily.   Yes [provider]  potassium chloride (KLOR-CON) 10 MEQ tablet Take 2 tablets (20 mEq total) by mouth daily. 08/08/19 03/30/22 Yes Thornell Mule, MD  rosuvastatin (CRESTOR) 10 MG tablet Take 10 mg by mouth at bedtime. 04/21/17  Yes [provider]  SPIRIVA RESPIMAT 2.5 MCG/ACT AERS Inhale 2 puff as directed once a day 12/04/21  Yes Lavina Hamman, MD  spironolactone (ALDACTONE) 25 MG tablet Take 25 mg by mouth daily. 09/25/21  Yes [provider]  acetaminophen (TYLENOL) 325 MG tablet Take 2 tablets (650 mg total) by mouth every 6 (six) hours as needed for mild pain (or Fever >/= 101). Patient not taking: Reported on 12/07/2019 08/08/19   Thornell Mule, MD  albuterol (PROVENTIL HFA;VENTOLIN HFA) 108 (90 BASE) MCG/ACT inhaler Inhale 2 puffs into the lungs every 4 (four) hours as needed for wheezing or shortness of breath. Patient not taking: Reported on 03/30/2022    [provider]  [facility] ALLERGY RELIEF 50 MCG/ACT nasal spray Place 1 spray into both nostrils 2 (two) times daily. 08/29/21   [provider]  guaiFENesin (MUCINEX) 600 MG 12 hr tablet Take 1 tablet (600 mg total) by mouth 2 (two) times daily. 12/04/21 12/04/22  Lavina Hamman, MD  Magnesium Oxide (MAGOX 400 PO) Take 1 tablet by mouth daily. Patient not taking: Reported on 03/30/2022 08/06/21   [provider]  predniSONE (DELTASONE) 10 MG tablet Take 20m daily for 3days,Take 39mdaily for 3days,Take 2071maily for 3days,Take 59m58mily for 3days, then stop Patient not taking: Reported on 03/30/2022 12/04/21    PateLavina Hamman  rOPINIRole (REQUIP) 0.5 MG tablet Take 0.5 mg by mouth at bedtime. Patient not taking: Reported on 03/30/2022    [provider]    Physical Exam: Vitals:   03/30/22 0951 03/30/22 1145 03/30/22 1200 03/30/22 1256  BP: (!) 124/55 (!) 114/54 (!) 108/46 (!) 113/49  Pulse: 60 (!) 55 (!) 54 (!) 56  Resp: (!) 21 (!) 25 (!) 25 (!) 21  Temp:    98 F (36.7 C)  TempSrc:    Axillary  SpO2: 96% 91% 90% 95%  Weight:      Height:        General: Vital signs reviewed.  Patient is well-developed and well-nourished, in no acute distress and cooperative with exam.  Head: Normocephalic and atraumatic. Eyes: EOMI, conjunctivae normal, no scleral icterus.  Neck: Supple, trachea midline, normal ROM,  Cardiovascular: RRR, S1 normal, S2 normal, no murmurs, gallops, or rubs. Pulmonary/Chest: Clear to auscultation bilaterally, no wheezes, rales, or rhonchi. Abdominal: Soft, non-tender, non-distended, BS +, Extremities: No lower extremity edema bilaterally,  pulses symmetric and intact bilaterally. No cyanosis or clubbing. Neurological: A&O to self, unable to perform full exam as she was somnolent with Ativan.  No apparent focal deficit Psychiatric:  Cognition and memory are impaired.  Data Reviewed: Prior data reviewed.  Assessment and Plan: * Respiratory failure, acute-on-chronic (HCC)[place] with history of COPD and uses 2 L of oxygen most of the time at baseline.  Continues to smoke. Initially visiting on arrival to ED, ABG with hypercarbia and significant shortness of breath so she was placed on BiPAP.  She received Solu-Medrol and breathing treatment along with one-time dose of IV Lasix for concern of flash pulmonary edema. Seems improving with no wheezing but remained on BiPAP during interview. -Admit to progressive care -Wean off from BiPAP -Continue supplemental oxygen -Solu-Medrol followed by prednisone for 5 days -Zithromax for 5 days for COPD  exacerbation -Continue with supplemental oxygen -Bronchodilators -Supportive care  COPD exacerbation (HCC)[place] above  Chronic diastolic heart failure (HCC)Lexingtontient developed flash pulmonary edema most likely with significantly elevated blood pressure with seizure. Received one-time dose of IV Lasix. Chest clear on exam.  Troponin negative. Echocardiogram done and March 2023 with grade 1 diastolic dysfunction and mild to moderate aortic stenosis. BNP elevated at 603. -Continue home Lasix and spironolactone  Essential hypertension Patient with  transient elevation of blood pressure above 885 systolic during seizure-like activity. Currently blood pressure within goal. -Restart home meds which include Lasix, spironolactone and losartan from tomorrow. -Holding metoprolol as heart rate in low 50s.   Seizure disorder [facility]) Patient has a witnessed seizure while in ED requiring Ativan and Keppra load.  Apparently missed her home dose of Vimpat. -Continue with Vimpat -Seizure precaution -Ativan as needed  Diabetes mellitus without complication (HCC) CBG currently within goal.  Uses glipizide at home. -Check A1c -SSI -Monitor closely as she is being getting steroid  Hypokalemia Mild hypokalemia with potassium of 3.4. Patient is on potassium supplement at home. -Continue home potassium supplement at 20 mEq daily  Tobacco use Current smoker of 1 pack/day. -Nicotine patch  GERD (gastroesophageal reflux disease) - Continue home Protonix  Neuropathy - Continue home gabapentin at 100 mg twice daily  Cirrhosis, non-alcoholic ([place]) Appears well compensated. -Continue to monitor  Advance Care Planning:   Code Status: Full Code discussed with son.  Full and DNR both are recorded in her chart.  Per son she will be full code at this time.  Consults: None  Family Communication: Discussed with son on phone  Severity of Illness: The appropriate patient status for this patient is  OBSERVATION. Observation status is judged to be reasonable and necessary in order to provide the required intensity of service to ensure the patient's safety. The patient's presenting symptoms, physical exam findings, and initial radiographic and laboratory data in the context of their medical condition is felt to place them at decreased risk for further clinical deterioration. Furthermore, it is anticipated that the patient will be medically stable for discharge from the hospital within 2 midnights of admission.   This record has been created using Systems analyst. Errors have been sought and corrected,but may not always be located. Such creation errors do not reflect on the standard of care.   Author: Lorella Nimrod, MD 03/30/2022 2:19 PM  For on call review [email or web address].

## 2022-03-30 NOTE — Progress Notes (Signed)
Patient had used the restroom and was just assisted to the bed. This RN was discussing her meds with the patient when the patient asked "why is my head shaking?" Patient's head was shaking from side to side. The RN questioned patient as to what happens when she has a seizure and patient became unresponsive. Patient repositioned on her side and vitals taken. O2 remained stable on 4L [place]. Rapid response was called and what appeared to be a seizure lasted almost 4 minutes. On-call provider updated. Orders received.   No other seizure activity observed for the remainder of the shift.

## 2022-03-30 NOTE — Assessment & Plan Note (Addendum)
Currently on 4 L of oxygen. Patient with history of COPD and uses 2 L of oxygen most of the time at baseline.  Continues to smoke. Initially visiting on arrival to ED, ABG with hypercarbia and significant shortness of breath so she was placed on BiPAP.  She received Solu-Medrol and breathing treatment along with one-time dose of IV Lasix for concern of flash pulmonary edema. -Continue supplemental oxygen -Solu-Medrol followed by prednisone for 5 days -Zithromax for 5 days for COPD exacerbation -Continue with supplemental oxygen -Bronchodilators -Supportive care

## 2022-03-30 NOTE — Assessment & Plan Note (Addendum)
Patient continued to have seizures despite Keppra load and restarting home Vimpat.  She missed couple of doses of home Vimpat before coming to the hospital. Neurology was consulted and she was given a Vimpat load.  Potassium and magnesium within normal limit. -Continue with Vimpat -Seizure precaution -Ativan as needed

## 2022-03-30 NOTE — Assessment & Plan Note (Addendum)
Currently blood pressure within goal. -Restart home meds which include Lasix, spironolactone and losartan from tomorrow. -Will restart metoprolol if heart rate started trending up, currently in 70s

## 2022-03-30 NOTE — ED Notes (Signed)
Pt taken to xray 

## 2022-03-30 NOTE — Assessment & Plan Note (Signed)
-  Continue home gabapentin at 100 mg twice daily

## 2022-03-30 NOTE — Assessment & Plan Note (Signed)
-  Continue home Protonix 

## 2022-03-30 NOTE — Progress Notes (Signed)
Chaplain responded to Rapid Request.  No family is present; pt not available.  Please contact if support is needed.   [place], [place] Pager:  [phone]    03/30/22 2100  Clinical Encounter Type  Visited With Patient not available  Visit Type Code  Referral From Nurse  Consult/Referral To Chaplain  Stress Factors  Patient Stress Factors Health changes

## 2022-03-31 ENCOUNTER — Inpatient Hospital Stay: Payer: Medicare Other

## 2022-03-31 DIAGNOSIS — F17210 Nicotine dependence, cigarettes, uncomplicated: Secondary | ICD-10-CM | POA: Diagnosis present

## 2022-03-31 DIAGNOSIS — I160 Hypertensive urgency: Secondary | ICD-10-CM | POA: Diagnosis present

## 2022-03-31 DIAGNOSIS — Z66 Do not resuscitate: Secondary | ICD-10-CM | POA: Diagnosis present

## 2022-03-31 DIAGNOSIS — E119 Type 2 diabetes mellitus without complications: Secondary | ICD-10-CM | POA: Diagnosis not present

## 2022-03-31 DIAGNOSIS — B961 Klebsiella pneumoniae [K. pneumoniae] as the cause of diseases classified elsewhere: Secondary | ICD-10-CM | POA: Diagnosis present

## 2022-03-31 DIAGNOSIS — Z20822 Contact with and (suspected) exposure to covid-19: Secondary | ICD-10-CM | POA: Diagnosis present

## 2022-03-31 DIAGNOSIS — J441 Chronic obstructive pulmonary disease with (acute) exacerbation: Secondary | ICD-10-CM | POA: Diagnosis present

## 2022-03-31 DIAGNOSIS — E876 Hypokalemia: Secondary | ICD-10-CM | POA: Diagnosis present

## 2022-03-31 DIAGNOSIS — I110 Hypertensive heart disease with heart failure: Secondary | ICD-10-CM | POA: Diagnosis present

## 2022-03-31 DIAGNOSIS — I350 Nonrheumatic aortic (valve) stenosis: Secondary | ICD-10-CM | POA: Diagnosis present

## 2022-03-31 DIAGNOSIS — J9622 Acute and chronic respiratory failure with hypercapnia: Secondary | ICD-10-CM | POA: Diagnosis present

## 2022-03-31 DIAGNOSIS — I5032 Chronic diastolic (congestive) heart failure: Secondary | ICD-10-CM | POA: Diagnosis present

## 2022-03-31 DIAGNOSIS — E669 Obesity, unspecified: Secondary | ICD-10-CM | POA: Diagnosis present

## 2022-03-31 DIAGNOSIS — G40901 Epilepsy, unspecified, not intractable, with status epilepticus: Secondary | ICD-10-CM | POA: Diagnosis not present

## 2022-03-31 DIAGNOSIS — M069 Rheumatoid arthritis, unspecified: Secondary | ICD-10-CM | POA: Diagnosis present

## 2022-03-31 DIAGNOSIS — G2581 Restless legs syndrome: Secondary | ICD-10-CM | POA: Diagnosis present

## 2022-03-31 DIAGNOSIS — K219 Gastro-esophageal reflux disease without esophagitis: Secondary | ICD-10-CM | POA: Diagnosis present

## 2022-03-31 DIAGNOSIS — J9621 Acute and chronic respiratory failure with hypoxia: Secondary | ICD-10-CM | POA: Diagnosis present

## 2022-03-31 DIAGNOSIS — I5033 Acute on chronic diastolic (congestive) heart failure: Secondary | ICD-10-CM | POA: Diagnosis not present

## 2022-03-31 DIAGNOSIS — G629 Polyneuropathy, unspecified: Secondary | ICD-10-CM

## 2022-03-31 DIAGNOSIS — G9341 Metabolic encephalopathy: Secondary | ICD-10-CM | POA: Diagnosis not present

## 2022-03-31 DIAGNOSIS — G40909 Epilepsy, unspecified, not intractable, without status epilepticus: Secondary | ICD-10-CM | POA: Diagnosis not present

## 2022-03-31 DIAGNOSIS — J810 Acute pulmonary edema: Secondary | ICD-10-CM | POA: Diagnosis present

## 2022-03-31 DIAGNOSIS — K7460 Unspecified cirrhosis of liver: Secondary | ICD-10-CM | POA: Diagnosis not present

## 2022-03-31 DIAGNOSIS — K7469 Other cirrhosis of liver: Secondary | ICD-10-CM | POA: Diagnosis present

## 2022-03-31 DIAGNOSIS — Z9981 Dependence on supplemental oxygen: Secondary | ICD-10-CM | POA: Diagnosis not present

## 2022-03-31 DIAGNOSIS — E1140 Type 2 diabetes mellitus with diabetic neuropathy, unspecified: Secondary | ICD-10-CM | POA: Diagnosis present

## 2022-03-31 DIAGNOSIS — G40101 Localization-related (focal) (partial) symptomatic epilepsy and epileptic syndromes with simple partial seizures, not intractable, with status epilepticus: Secondary | ICD-10-CM | POA: Diagnosis present

## 2022-03-31 DIAGNOSIS — N390 Urinary tract infection, site not specified: Secondary | ICD-10-CM | POA: Diagnosis present

## 2022-03-31 DIAGNOSIS — F32A Depression, unspecified: Secondary | ICD-10-CM | POA: Diagnosis present

## 2022-03-31 DIAGNOSIS — I161 Hypertensive emergency: Secondary | ICD-10-CM | POA: Diagnosis present

## 2022-03-31 LAB — CBC
HCT: 37.7 % (ref 36.0–46.0)
Hemoglobin: 12 g/dL (ref 12.0–15.0)
MCH: 26.7 pg (ref 26.0–34.0)
MCHC: 31.8 g/dL (ref 30.0–36.0)
MCV: 83.8 fL (ref 80.0–100.0)
Platelets: 172 10*3/uL (ref 150–400)
RBC: 4.5 MIL/uL (ref 3.87–5.11)
RDW: 13.7 % (ref 11.5–15.5)
WBC: 7.2 10*3/uL (ref 4.0–10.5)
nRBC: 0 % (ref 0.0–0.2)

## 2022-03-31 LAB — MAGNESIUM: Magnesium: 1.9 mg/dL (ref 1.7–2.4)

## 2022-03-31 LAB — URINALYSIS, COMPLETE (UACMP) WITH MICROSCOPIC
Bilirubin Urine: NEGATIVE
Glucose, UA: NEGATIVE mg/dL
Hgb urine dipstick: NEGATIVE
Ketones, ur: NEGATIVE mg/dL
Leukocytes,Ua: NEGATIVE
Nitrite: POSITIVE — AB
Protein, ur: NEGATIVE mg/dL
Specific Gravity, Urine: 1.014 (ref 1.005–1.030)
pH: 5 (ref 5.0–8.0)

## 2022-03-31 LAB — URINE DRUG SCREEN, QUALITATIVE (ARMC ONLY)
Amphetamines, Ur Screen: NOT DETECTED
Barbiturates, Ur Screen: NOT DETECTED
Benzodiazepine, Ur Scrn: POSITIVE — AB
Cannabinoid 50 Ng, Ur ~~LOC~~: NOT DETECTED
Cocaine Metabolite,Ur ~~LOC~~: NOT DETECTED
MDMA (Ecstasy)Ur Screen: NOT DETECTED
Methadone Scn, Ur: NOT DETECTED
Opiate, Ur Screen: NOT DETECTED
Phencyclidine (PCP) Ur S: NOT DETECTED
Tricyclic, Ur Screen: NOT DETECTED

## 2022-03-31 LAB — BLOOD GAS, ARTERIAL
Acid-Base Excess: 5.3 mmol/L — ABNORMAL HIGH (ref 0.0–2.0)
Bicarbonate: 30.5 mmol/L — ABNORMAL HIGH (ref 20.0–28.0)
O2 Content: 2 L/min
O2 Saturation: 96.5 %
Patient temperature: 37
pCO2 arterial: 46 mmHg (ref 32–48)
pH, Arterial: 7.43 (ref 7.35–7.45)
pO2, Arterial: 70 mmHg — ABNORMAL LOW (ref 83–108)

## 2022-03-31 LAB — GLUCOSE, CAPILLARY
Glucose-Capillary: 211 mg/dL — ABNORMAL HIGH (ref 70–99)
Glucose-Capillary: 269 mg/dL — ABNORMAL HIGH (ref 70–99)
Glucose-Capillary: 93 mg/dL (ref 70–99)
Glucose-Capillary: 176 mg/dL — ABNORMAL HIGH (ref 70–99)

## 2022-03-31 LAB — BASIC METABOLIC PANEL
Anion gap: 8 (ref 5–15)
BUN: 17 mg/dL (ref 8–23)
CO2: 27 mmol/L (ref 22–32)
Calcium: 8.8 mg/dL — ABNORMAL LOW (ref 8.9–10.3)
Chloride: 109 mmol/L (ref 98–111)
Creatinine, Ser: 0.66 mg/dL (ref 0.44–1.00)
GFR, Estimated: >60 mL/min (ref >60)
Glucose, Bld: 208 mg/dL — ABNORMAL HIGH (ref 70–99)
Potassium: 3.7 mmol/L (ref 3.5–5.1)
Sodium: 144 mmol/L (ref 135–145)

## 2022-03-31 LAB — MRSA NEXT GEN BY PCR, NASAL: MRSA by PCR Next Gen: NOT DETECTED

## 2022-03-31 MED ORDER — LACOSAMIDE 50 MG PO TABS: 150.0000 mg | ORAL_TABLET | Freq: Two times a day (BID) | ORAL | Status: DC

## 2022-03-31 MED ORDER — LORAZEPAM 2 MG/ML IJ SOLN
2.0000 mg | Freq: Once | INTRAMUSCULAR | Status: AC | PRN
  Administered 2022-04-01: 2 mg via INTRAVENOUS
  Filled 2022-03-31: qty 1

## 2022-03-31 MED ORDER — ALBUTEROL SULFATE (2.5 MG/3ML) 0.083% IN NEBU
2.5000 mg | INHALATION_SOLUTION | RESPIRATORY_TRACT | Status: DC | PRN
  Administered 2022-03-31: 2.5 mg via RESPIRATORY_TRACT
  Filled 2022-03-31: qty 3

## 2022-03-31 MED ORDER — CHLORHEXIDINE GLUCONATE CLOTH 2 % EX PADS
6.0000 | MEDICATED_PAD | Freq: Every day | CUTANEOUS | Status: DC
  Administered 2022-03-31 – 2022-04-02 (×3): 6 via TOPICAL

## 2022-03-31 MED ORDER — LEVETIRACETAM IN NACL 1500 MG/100ML IV SOLN
1500.0000 mg | Freq: Once | INTRAVENOUS | Status: AC
  Administered 2022-03-31: 1500 mg via INTRAVENOUS
  Filled 2022-03-31: qty 100

## 2022-03-31 MED ORDER — SODIUM CHLORIDE 0.9 % IV SOLN
150.0000 mg | Freq: Two times a day (BID) | INTRAVENOUS | Status: DC
  Filled 2022-03-31: qty 15

## 2022-03-31 MED ORDER — LEVETIRACETAM IN NACL 500 MG/100ML IV SOLN
500.0000 mg | Freq: Two times a day (BID) | INTRAVENOUS | Status: DC
  Administered 2022-04-01: 500 mg via INTRAVENOUS
  Filled 2022-03-31: qty 100

## 2022-03-31 MED ORDER — SODIUM CHLORIDE 0.9 % IV SOLN
250.0000 mg | Freq: Once | INTRAVENOUS | Status: AC
  Administered 2022-03-31: 250 mg via INTRAVENOUS
  Filled 2022-03-31: qty 25

## 2022-03-31 MED ORDER — LORAZEPAM 2 MG/ML IJ SOLN
2.0000 mg | Freq: Once | INTRAMUSCULAR | Status: AC
  Administered 2022-03-31: 2 mg via INTRAVENOUS
  Filled 2022-03-31: qty 1

## 2022-03-31 NOTE — Progress Notes (Signed)
Eeg done

## 2022-03-31 NOTE — Progress Notes (Signed)
Most recent seizure witnessed for 2.5 minutes

## 2022-03-31 NOTE — Procedures (Signed)
Routine EEG Report  [name] is a 71 y.o. female with a history of seizures who is undergoing an EEG to evaluate for seizures.  Report: This EEG was acquired with electrodes placed according to the International 10-20 electrode system (including Fp1, Fp2, F3, F4, C3, C4, P3, P4, O1, O2, T3, T4, T5, T6, A1, A2, Fz, Cz, Pz). The following electrodes were missing or displaced: none.  The occipital dominant rhythm was 6 Hz with intermittent more pronounced diffuse slowing. This activity is reactive to stimulation. Drowsiness was manifested by background fragmentation; deeper stages of sleep were not identified. There was no focal slowing. There were no interictal epileptiform discharges. There were no electrographic seizures identified. Photic stimulation and hyperventilation were not performed.   Impression and clinical correlation: This EEG was obtained while awake and drowsy and is abnormal due to mild-to-moderate diffuse slowing indicative of global cerebral dysfunction. Epileptiform abnormalities were not seen during this recording.  Su Monks, MD [facility] [phone]  If 7pm- 7am, please page neurology on call as listed in [place].

## 2022-03-31 NOTE — Plan of Care (Signed)
Neurology brief note  This is a 71 yo patient well known to our service with both epileptic seizures and non-epileptic spells admitted with SOB. She has had multiple witnessed seizure-like episodes of multiple types (staring off, focal shaking, unresponsiveness) since admission.   She was on keppra 532m bid and vimpat 153mbid as outpatient until recently she self-discontinued her keppra. She told Dr. ShManuella Ghazihis after their appt last week and he said as long as she was well-controlled it would be ok if she continued vimpat only and not the keppra. On my examination she is lethargic likely 2/2 ativan but otherwise she is at her baseline.  I ordered the following AEDs today: - Vimpat 40042moad f/b 150m52md - Keppra 20mg18mload f/b 500mg 69m- Ativan 2mg x126mer breakthrough seizures were provoked in the setting of missing AED doses. Routine EEG this evening showed no seizures (no spells captured). No indication for transfer for cEEG at this time. If patient develops convulsive seizures lasting >5 min or multiple seizures without interval return to baseline, fluctuating mental status, or other signs concerning for seizure she would at that point [facility]  Full consult note to follow.  ColleenSu [facility] [phone]- 7am, please page neurology on call as listed in [email or web address]

## 2022-03-31 NOTE — Progress Notes (Signed)
   03/31/22 1100  Clinical Encounter Type  Visited With Patient not available  Visit Type Initial  Referral From Chaplain  Consult/Referral To [place] followed up from on call Chaplain regarding rapid from previous evening. Patient was unavailable with care team. Chaplain services are available for follow up if needed.

## 2022-03-31 NOTE — Progress Notes (Signed)
Progress Note   Patient: [name] GMW:[id number] DOB: 01-20-1951 DOA: 03/30/2022     0 DOS: the patient was seen and examined on 03/31/2022   Brief hospital course: IVIANNA NOTCH is a 71 y.o. female with past medical history of heart failure, COPD, diabetes, seizures, cirrhosis, here with shortness of breath, gradually worsening over the past couple of week to the point that it really get worse yesterday and she was not being able to speak in full sentences despite using home oxygen all the time, at baseline she was mostly using at night and occasionally with ambulation.  Patient was little somnolent after getting Ativan for seizure during admission.  Most of the history was obtained from son on phone.  Per son patient was also having worsening cough with some sputum production, no fever or chills. Some decrease in appetite for the past couple of weeks but she was able to eat dinner with the family.  No chest pain.  No urinary symptoms.  ED course.  Patient has hemodynamically stable on arrival.  CBC and CMP without any significant abnormality.  Troponin negative.  BNP at 603.5. COVID and influenza PCR negative. Chest x-ray with concern of mild congestion. CT head with no acute abnormalities.  Patient developed a witnessed seizure while in the ED during that time her blood pressure increased above 440 systolic.  She was given a Keppra load and Ativan.  Apparently missed her morning dose of Vimpat.  She was also given Solu-Medrol and DuoNeb. Placed on BiPAP for concern of hypercarbia and decreased mental status with seizure.  Able to wean her back to 4 L of oxygen.  She was admitted for acute on chronic respiratory failure most likely secondary to COPD exacerbation.  9/25: Patient continued to have seizures despite Keppra load in ED and restarting home Vimpat.  Neurology was consulted and they ordered Vimpat load. Had 3 seizures since this morning requiring Ativan. Patient was  somnolent when seen today during morning rounds as she received second dose of Ativan recently.   Assessment and Plan: * Respiratory failure, acute-on-chronic (HCC) Currently on 4 L of oxygen. Patient with history of COPD and uses 2 L of oxygen most of the time at baseline.  Continues to smoke. Initially visiting on arrival to ED, ABG with hypercarbia and significant shortness of breath so she was placed on BiPAP.  She received Solu-Medrol and breathing treatment along with one-time dose of IV Lasix for concern of flash pulmonary edema. -Continue supplemental oxygen -Solu-Medrol followed by prednisone for 5 days -Zithromax for 5 days for COPD exacerbation -Continue with supplemental oxygen -Bronchodilators -Supportive care  COPD exacerbation ([place]) See above  Chronic diastolic heart failure ([place]) Patient developed flash pulmonary edema most likely with significantly elevated blood pressure with seizure. Received one-time dose of IV Lasix. Chest clear on exam.  Troponin negative. Echocardiogram done and March 2023 with grade 1 diastolic dysfunction and mild to moderate aortic stenosis. BNP elevated at 603. -Continue home Lasix and spironolactone  Essential hypertension Currently blood pressure within goal. -Restart home meds which include Lasix, spironolactone and losartan from tomorrow. -Will restart metoprolol if heart rate started trending up, currently in 70s   Seizure disorder ([place]) Patient continued to have seizures despite Keppra load and restarting home Vimpat.  She missed couple of doses of home Vimpat before coming to the hospital. Neurology was consulted and she was given a Vimpat load.  Potassium and magnesium within normal limit. -Continue with Vimpat -Seizure precaution -Ativan  as needed  Diabetes mellitus without complication (HCC) CBG currently within goal.  Uses glipizide at home. -Check A1c -SSI -Monitor closely as she is being getting  steroid  Hypokalemia Resolved.  Magnesium within normal limit Patient is on potassium supplement at home. -Continue home potassium supplement at 20 mEq daily  Tobacco use Current smoker of 1 pack/day. -Nicotine patch  GERD (gastroesophageal reflux disease) - Continue home Protonix  Neuropathy - Continue home gabapentin at 100 mg twice daily  Cirrhosis, non-alcoholic ([place]) Appears well compensated. -Continue to monitor   Subjective: Patient was somnolent when seen today.  She received Ativan little while ago.  Arousable but going back to sleep.  Physical Exam: Vitals:   03/31/22 0008 03/31/22 0413 03/31/22 0734 03/31/22 1123  BP: (!) 111/53 117/67 (!) 135/55 (!) 120/58  Pulse: (!) 59 75 71 71  Resp: _0 Temp: 97.7 F (36.5 C) 98 F (36.7 C) 98.2 F (36.8 C) 98.1 F (36.7 C)  TempSrc:      SpO2: 93% 91% 92% 97%  Weight:      Height:       General.  Obese, somnolent elderly lady, in no acute distress. Pulmonary.  Some scattered wheeze bilaterally, normal respiratory effort. CV.  Regular rate and rhythm, no JVD, rub or murmur. Abdomen.  Soft, nontender, nondistended, BS positive. CNS.  Somnolent, following simple commands. Extremities.  No edema, no cyanosis, pulses intact and symmetrical. Psychiatry.  Appears to have some cognitive impairment.  Data Reviewed: Prior data reviewed  Family Communication: Discussed with son on phone  Disposition: Status is: Inpatient Remains inpatient appropriate because: Severity of illness   Planned Discharge Destination: Home with Home Health  DVT prophylaxis.  Lovenox Time spent: 50 minutes  This record has been created using Systems analyst. Errors have been sought and corrected,but may not always be located. Such creation errors do not reflect on the standard of care.  Author: Lorella Nimrod, MD 03/31/2022 1:20 PM  For on call review [email or web address].

## 2022-04-01 ENCOUNTER — Encounter: Payer: Self-pay | Admitting: Internal Medicine

## 2022-04-01 ENCOUNTER — Encounter (HOSPITAL_COMMUNITY): Payer: Self-pay

## 2022-04-01 DIAGNOSIS — K7460 Unspecified cirrhosis of liver: Secondary | ICD-10-CM | POA: Diagnosis not present

## 2022-04-01 DIAGNOSIS — J9621 Acute and chronic respiratory failure with hypoxia: Secondary | ICD-10-CM | POA: Diagnosis not present

## 2022-04-01 DIAGNOSIS — J441 Chronic obstructive pulmonary disease with (acute) exacerbation: Secondary | ICD-10-CM | POA: Diagnosis not present

## 2022-04-01 DIAGNOSIS — G40901 Epilepsy, unspecified, not intractable, with status epilepticus: Secondary | ICD-10-CM

## 2022-04-01 DIAGNOSIS — J9622 Acute and chronic respiratory failure with hypercapnia: Secondary | ICD-10-CM | POA: Diagnosis not present

## 2022-04-01 LAB — GLUCOSE, CAPILLARY
Glucose-Capillary: 197 mg/dL — ABNORMAL HIGH (ref 70–99)
Glucose-Capillary: 167 mg/dL — ABNORMAL HIGH (ref 70–99)
Glucose-Capillary: 170 mg/dL — ABNORMAL HIGH (ref 70–99)
Glucose-Capillary: 211 mg/dL — ABNORMAL HIGH (ref 70–99)

## 2022-04-01 MED ORDER — LEVETIRACETAM 500 MG PO TABS: 500.0000 mg | ORAL_TABLET | Freq: Two times a day (BID) | ORAL | Status: DC

## 2022-04-01 MED ORDER — LACOSAMIDE 50 MG PO TABS
200.0000 mg | ORAL_TABLET | Freq: Two times a day (BID) | ORAL | Status: DC
  Administered 2022-04-01 (×2): 200 mg via ORAL
  Filled 2022-04-01 (×3): qty 4

## 2022-04-01 MED ORDER — INSULIN ASPART 100 UNIT/ML IJ SOLN: 0.0000 [IU] | Freq: Every day | INTRAMUSCULAR | 11 refills | Status: DC

## 2022-04-01 MED ORDER — ENSURE ENLIVE PO LIQD: 237.0000 mL | Freq: Three times a day (TID) | ORAL | 12 refills | Status: DC

## 2022-04-01 MED ORDER — LACOSAMIDE 200 MG PO TABS: 200.0000 mg | ORAL_TABLET | Freq: Two times a day (BID) | ORAL | Status: DC

## 2022-04-01 MED ORDER — AZITHROMYCIN 250 MG PO TABS: ORAL_TABLET | ORAL | 0 refills | Status: DC

## 2022-04-01 MED ORDER — LEVETIRACETAM IN NACL 1000 MG/100ML IV SOLN
1000.0000 mg | Freq: Two times a day (BID) | INTRAVENOUS | Status: DC
  Administered 2022-04-01 – 2022-04-02 (×2): 1000 mg via INTRAVENOUS
  Filled 2022-04-01 (×3): qty 100

## 2022-04-01 MED ORDER — LEVETIRACETAM IN NACL 1500 MG/100ML IV SOLN: 1500.0000 mg | Freq: Two times a day (BID) | INTRAVENOUS | Status: DC

## 2022-04-01 MED ORDER — SODIUM CHLORIDE 0.9 % IV SOLN
4000.0000 mg | Freq: Once | INTRAVENOUS | Status: AC
  Administered 2022-04-01: 4000 mg via INTRAVENOUS
  Filled 2022-04-01: qty 40

## 2022-04-01 MED ORDER — PREDNISONE 20 MG PO TABS: 40.0000 mg | ORAL_TABLET | Freq: Every day | ORAL | Status: DC

## 2022-04-01 MED ORDER — ADULT MULTIVITAMIN W/MINERALS CH
1.0000 | ORAL_TABLET | Freq: Every day | ORAL | Status: DC
  Filled 2022-04-01: qty 1

## 2022-04-01 MED ORDER — SODIUM CHLORIDE 0.9 % IV SOLN
4000.0000 mg | Freq: Once | INTRAVENOUS | Status: DC
  Filled 2022-04-01: qty 40

## 2022-04-01 MED ORDER — LEVETIRACETAM IN NACL 1000 MG/100ML IV SOLN: 1000.0000 mg | Freq: Two times a day (BID) | INTRAVENOUS | Status: DC

## 2022-04-01 MED ORDER — IPRATROPIUM-ALBUTEROL 0.5-2.5 (3) MG/3ML IN SOLN: 3.0000 mL | RESPIRATORY_TRACT | Status: DC | PRN

## 2022-04-01 MED ORDER — INSULIN ASPART 100 UNIT/ML IJ SOLN: 0.0000 [IU] | Freq: Three times a day (TID) | INTRAMUSCULAR | 11 refills | Status: DC

## 2022-04-01 MED ORDER — LACTATED RINGERS IV SOLN: INTRAVENOUS | Status: DC

## 2022-04-01 MED ORDER — ENSURE ENLIVE PO LIQD
237.0000 mL | Freq: Three times a day (TID) | ORAL | Status: DC
  Administered 2022-04-01: 237 mL via ORAL

## 2022-04-01 MED ORDER — METOPROLOL SUCCINATE ER 25 MG PO TB24: 25.0000 mg | ORAL_TABLET | Freq: Every day | ORAL | 0 refills | Status: DC

## 2022-04-01 MED ORDER — LEVETIRACETAM IN NACL 500 MG/100ML IV SOLN: 500.0000 mg | Freq: Two times a day (BID) | INTRAVENOUS | Status: DC

## 2022-04-01 NOTE — Progress Notes (Signed)
Initial Nutrition Assessment  DOCUMENTATION CODES:   Not applicable  INTERVENTION:   Recommend post pyloric nasogastric tube placement and nutrition support  Ensure Enlive po TID, each supplement provides 350 kcal and 20 grams of protein.  MVI po daily   Liberalize diet  Pt at high refeed risk; recommend monitor potassium, magnesium and phosphorus labs daily until stable  NUTRITION DIAGNOSIS:   Inadequate oral intake related to acute illness as evidenced by meal completion < 25%.  GOAL:   Patient will meet greater than or equal to 90% of their needs  MONITOR:   PO intake, Supplement acceptance, Labs, Weight trends, I & O's, Skin  REASON FOR ASSESSMENT:   Malnutrition Screening Tool    ASSESSMENT:   71 y/o female with h/o deafness, seziures, COPD, CHF, RA, DM, NASH cirrhosis, IBS, CVA, HTN and GERD who is admitted with COPD exacerbation and seizures.  Visited pt's room today. Pt unable to provide any nutrition related history r/t AMS. Pt with intermittent seizures and AMS and has been unable to take in adequate nutrition. Pt did eat 20% of her dinner last night and drank some Ensure. Pt did not touch her breakfast this morning as she is altered. Plan today is for [facility] to [facility] for continuous [facility]. Would recommend post pyloric NGT placement and nutrition support to help pt meet her estimated needs. Discussed with MD, will hopefully be able to have cortrak placement at [facility]. Pt is at refeed risk. Per chart, pt noted to have weight gain pta.    Medications reviewed and include: azithromycin, celexa, plavix, lovenox, insulin, nicotine, protonix, Kcl, prednisone, aldactone  Labs reviewed: K 3.7 wnl, Mg 1.9 wnl Cbgs- 167, 197 x 24 hrs AIC 6.2(H)- 5/31  NUTRITION - FOCUSED PHYSICAL EXAM:  Flowsheet Row Most Recent Value  Orbital Region No depletion  Upper Arm Region No depletion  Thoracic and Lumbar Region No depletion  Buccal Region No depletion  Temple  Region No depletion  Clavicle Bone Region No depletion  Clavicle and Acromion Bone Region No depletion  Scapular Bone Region No depletion  Dorsal Hand Mild depletion  Patellar Region Moderate depletion  Anterior Thigh Region Moderate depletion  Posterior Calf Region Moderate depletion  Edema (RD Assessment) None  Hair Reviewed  Eyes Reviewed  Mouth Reviewed  Skin Reviewed  Nails Reviewed   Diet Order:   Diet Order             Diet heart healthy/carb modified Room service appropriate? Yes; Fluid consistency: Thin  Diet effective now                  EDUCATION NEEDS:   Not appropriate for education at this time  Skin:  Skin Assessment: Reviewed RN Assessment (ecchymosis)  Last BM:  9/26- type 2  Height:   Ht Readings from Last 1 Encounters:  03/31/22 _0  (1.6 m)    Weight:   Wt Readings from Last 1 Encounters:  03/31/22 78.1 kg    Ideal Body Weight:  52.2 kg  BMI:  Body mass index is 30.5 kg/m.  Estimated Nutritional Needs:   Kcal:  1700-2000kcal/day  Protein:  85-100g/day  Fluid:  1.4-1.6L/day  Koleen Distance MS, RD, LDN Please refer to [name] for RD and/or RD on-call/weekend/after hours pager

## 2022-04-01 NOTE — Progress Notes (Signed)
CROSS COVER NOTE  NAME: [name] MRN: [id number] DOB : 08/24/1950    Date of Service   04/01/2022   HPI/Events of Note   Notified of bladder scan volume of 433m. Patient was able to void using bedpan with PVR of 340 mL  Interventions   Assessment/Plan:  In and Out Cath x1     This document was prepared using Dragon voice recognition software and may include unintentional dictation errors.  KNeomia GlassDNP, MHA, FNP-BC Nurse Practitioner [facility] CB[facility] LLCPager [phone]

## 2022-04-01 NOTE — Consult Note (Signed)
NEUROLOGY CONSULTATION NOTE   Date of service: April 01, 2022 Patient Name: [name] MRN:  [id number] DOB:  11-Feb-1951 Reason for consult: breakthrough seizures in pt w/ epilepsy and PNES Requesting physician: Dr. 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    She is currently admitted to [facility] for acute on chronic respiratory failure in the setting of COPD exacerbation and hospitalization has been complicated by flash pulmonary edema.  Patient has had multiple seizure-like episodes since admission up to several per day.  As is typical for her these have displayed variable semiology's including staring off, focal shaking, and unresponsiveness.  She has received Ativan after nearly all of these episodes. Regarding her outpatient AED regimen: she was on keppra 543m bid and vimpat 1521mbid as outpatient until recently she self-discontinued her keppra. She told Dr. ShManuella Ghazihis after their appt last week and he said as long as she was well-controlled it would be ok if she continued vimpat only and not the keppra. On my examination she is lethargic likely 2/2 ativan but otherwise she is at her baseline.  Prior to the current admission [facility] at which time she was having 1-2 spells per day consistent with seizure versus PNES for which she almost always received Ativan by the nurses making it difficult to check postictal behavior.  [facility] at different times: Focal motor movements,  staring, blinking, or decreased responsiveness.  [facility] several days of continuous EEG.  That EEG did not show any seizures however no spells were captured during that recording.  She also had an MRI during that visit on 1023 that was negative for acute findings and just showed chronic white matter changes.   ROS   Per HPI: all other systems reviewed and are negative  Past History   I have reviewed the following:  Past Medical History:  Diagnosis Date   Asthma    CHF (congestive heart failure) (HCC)    Cirrhosis, non-alcoholic (HCC)    COPD (chronic obstructive pulmonary disease) ([facility]   Deaf    Depression    Diabetes mellitus without complication ([facility] [place]   GERD (gastroesophageal reflux disease)    Heart murmur    Hepatitis    History of rheumatic fever    History of scarlet fever    Hypertension    IBS (irritable bowel syndrome)    Lymph node disorder    arm   Neuropathy    On home oxygen therapy    hs   Orthopnea    Osteoarthritis    RA (rheumatoid arthritis) (HCC)    RLS (restless legs syndrome)    Seizures (HCC)    Shortness of breath dyspnea    Sleep apnea    Stroke (HCEly   tia   Past Surgical History:  Procedure Laterality Date   ABDOMINAL HYSTERECTOMY     BREAST BIOPSY Right  10/30/2020   Stereo Bx, X-clip,  BENIGN BREAST TISSUE WITH FIBROADENOMATOUS   CATARACT EXTRACTION W/PHACO Right 11/23/2014   Procedure: CATARACT EXTRACTION PHACO AND INTRAOCULAR LENS PLACEMENT ([place]);  Surgeon: Lyla Glassing, MD;  Location: [facility] ORS;  Service: Ophthalmology;  Laterality: Right;   CATARACT EXTRACTION W/PHACO Left 12/14/2014   Procedure: CATARACT EXTRACTION PHACO AND INTRAOCULAR LENS PLACEMENT (I[facility]);  Surgeon: Lyla Glassing, MD;  Location: [facility] ORS;  Service: Ophthalmology;  Laterality: Left;  US:01:16.6 AP:15.8 CDE:12.14   CESAREAN SECTION     CHOLECYSTECTOMY     COLONOSCOPY WITH PROPOFOL N/A 10/08/2020    Procedure: COLONOSCOPY WITH PROPOFOL;  Surgeon: Toledo, Benay Pike, MD;  Location: [facility] ENDOSCOPY;  Service: Gastroenterology;  Laterality: N/A;   ESOPHAGOGASTRODUODENOSCOPY (EGD) WITH PROPOFOL N/A 10/08/2020   Procedure: ESOPHAGOGASTRODUODENOSCOPY (EGD) WITH PROPOFOL;  Surgeon: Toledo, Benay Pike, MD;  Location: [facility] ENDOSCOPY;  Service: Gastroenterology;  Laterality: N/A;  DM DEAF, NEEDS SIGN INTERPRETER PER SON   REVERSE SHOULDER ARTHROPLASTY Right 12/21/2019   Procedure: REVERSE SHOULDER ARTHROPLASTY;  Surgeon: Lovell Sheehan, MD;  Location: [facility] ORS;  Service: Orthopedics;  Laterality: Right;   THUMB ARTHROSCOPY     TONSILLECTOMY     TYMPANOPLASTY     muliple   Family History  Problem Relation Age of Onset   Lung cancer Mother    CAD Father    Breast cancer Sister    Social History   Socioeconomic History   Marital status: Widowed    Spouse name: Not on file   Number of children: Not on file   Years of education: Not on file   Highest education level: Not on file  Occupational History   Not on file  Tobacco Use   Smoking status: Every Day    Packs/day: 0.50    Years: 52.00    Total pack years: 26.00    Types: Cigarettes    Start date: 03/01/1968   Smokeless tobacco: Never  Vaping Use   Vaping Use: Former  Substance and Sexual Activity   Alcohol use: No   Drug use: No   Sexual activity: Not Currently  Other Topics Concern   Not on file  Social History Narrative   Pt lives in 1 story home with her son, his girlfriend and a family friend   Has 2 adult children   9th grade education   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    Social Determinants of Health   Financial Resource Strain: Not on file  Food Insecurity: No Food Insecurity (03/30/2022)   Hunger Vital Sign    Worried About Running Out of Food in the Last Year: Never true    Ran Out of Food in the Last Year: Never true  Transportation Needs: No Transportation Needs (03/30/2022)    PRAPARE - Hydrologist (Medical): No    Lack of Transportation (Non-Medical): No  Physical Activity: Not on file  Stress: Not on file  Social Connections: Not on file   Allergies  Allergen Reactions   Aspirin Itching   Celebrex [Celecoxib] Itching    itching   Ciprofloxacin Itching   Codeine Itching   Fosphenytoin Itching   Levaquin [Levofloxacin In D5w] Itching   Levofloxacin Itching   Lovastatin Itching   Penicillins     Documentation indicates severe reaction  Pt tolerated cephalosporin without adverse reaction 09/18    Pravastatin Itching   Sulfa Antibiotics Itching    Medications   Medications Prior  to Admission  Medication Sig Dispense Refill Last Dose   citalopram (CELEXA) 10 MG tablet Take 10 mg by mouth daily.   03/29/2022   clopidogrel (PLAVIX) 75 MG tablet Take 1 tablet (75 mg total) by mouth daily. 30 tablet 1 03/29/2022   furosemide (LASIX) 40 MG tablet Take 40 mg by mouth daily.   03/29/2022   gabapentin (NEURONTIN) 100 MG capsule Take 100 mg by mouth 2 (two) times daily.   03/29/2022   glimepiride (AMARYL) 2 MG tablet Take 2 mg by mouth daily.   03/29/2022   ipratropium-albuterol (DUONEB) 0.5-2.5 (3) MG/3ML SOLN Take 3 mLs by nebulization in the morning, at noon, and at bedtime for 5 days. 45 mL 0 03/29/2022   lacosamide 150 MG TABS Take 1 tablet (150 mg total) by mouth 2 (two) times daily. 60 tablet 2 03/29/2022   losartan (COZAAR) 100 MG tablet Take 100 mg by mouth daily.   03/29/2022   metoprolol succinate (TOPROL-XL) 25 MG 24 hr tablet Take 1 tablet (25 mg total) by mouth daily. 60 tablet 0 03/29/2022   montelukast (SINGULAIR) 10 MG tablet Take 10 mg by mouth at bedtime.   4 03/29/2022   pantoprazole (PROTONIX) 40 MG tablet Take 40 mg by mouth daily.   03/29/2022   potassium chloride (KLOR-CON) 10 MEQ tablet Take 2 tablets (20 mEq total) by mouth daily. 60 tablet 0 03/29/2022   rosuvastatin (CRESTOR) 10 MG tablet Take 10 mg by mouth at  bedtime.  5 03/29/2022   SPIRIVA RESPIMAT 2.5 MCG/ACT AERS Inhale 2 puff as directed once a day 4 g 0 03/30/2022   spironolactone (ALDACTONE) 25 MG tablet Take 25 mg by mouth daily.   03/29/2022   acetaminophen (TYLENOL) 325 MG tablet Take 2 tablets (650 mg total) by mouth every 6 (six) hours as needed for mild pain (or Fever >/= 101). (Patient not taking: Reported on 12/07/2019) 60 tablet 1    albuterol (PROVENTIL HFA;VENTOLIN HFA) 108 (90 BASE) MCG/ACT inhaler Inhale 2 puffs into the lungs every 4 (four) hours as needed for wheezing or shortness of breath. (Patient not taking: Reported on 03/30/2022)   Not Taking   FLONASE ALLERGY RELIEF 50 MCG/ACT nasal spray Place 1 spray into both nostrils 2 (two) times daily.   prn at prn   guaiFENesin (MUCINEX) 600 MG 12 hr tablet Take 1 tablet (600 mg total) by mouth 2 (two) times daily. 60 tablet 2 prn at prn   Magnesium Oxide (MAGOX 400 PO) Take 1 tablet by mouth daily. (Patient not taking: Reported on 03/30/2022)   Not Taking   predniSONE (DELTASONE) 10 MG tablet Take 39m daily for 3days,Take 380mdaily for 3days,Take 2093maily for 3days,Take 23m25mily for 3days, then stop (Patient not taking: Reported on 03/30/2022) 30 tablet 0 Not Taking   rOPINIRole (REQUIP) 0.5 MG tablet Take 0.5 mg by mouth at bedtime. (Patient not taking: Reported on 03/30/2022)   Not Taking      Current Facility-Administered Medications:    acetaminophen (TYLENOL) tablet 650 mg, 650 mg, Oral, Q6H PRN **OR** acetaminophen (TYLENOL) suppository 650 mg, 650 mg, Rectal, Q6H PRN, AminLorella Nimrod   albuterol (PROVENTIL) (2.5 MG/3ML) 0.083% nebulizer solution 2.5 mg, 2.5 mg, Nebulization, Q4H PRN, AminLorella Nimrod, 2.5 mg at 03/31/22 1633   [COMPLETED] azithromycin (ZITHROMAX) tablet 500 mg, 500 mg, Oral, Daily, 500 mg at 03/30/22 1712 **FOLLOWED BY** azithromycin (ZITHROMAX) tablet 250 mg, 250 mg, Oral, Daily, AminLorella Nimrod, 250 mg at 04/01/22 [id number]  Chlorhexidine Gluconate Cloth 2 %  PADS 6 each, 6 each, Topical, Q0600, Lorella Nimrod, MD, 6 each at 03/31/22 1536   citalopram (CELEXA) tablet 10 mg, 10 mg, Oral, Daily, Lorella Nimrod, MD, 10 mg at 04/01/22 0931   clopidogrel (PLAVIX) tablet 75 mg, 75 mg, Oral, Daily, Lorella Nimrod, MD, 75 mg at 04/01/22 0929   enoxaparin (LOVENOX) injection 40 mg, 40 mg, Subcutaneous, Q24H, Amin, Soundra Pilon, MD, 40 mg at 03/31/22 2211   fluticasone (FLONASE) 50 MCG/ACT nasal spray 1 spray, 1 spray, Each Nare, BID, Lorella Nimrod, MD, 1 spray at 03/31/22 0926   furosemide (LASIX) tablet 40 mg, 40 mg, Oral, Daily, Lorella Nimrod, MD, 40 mg at 04/01/22 0929   gabapentin (NEURONTIN) capsule 100 mg, 100 mg, Oral, BID, Amin, Soundra Pilon, MD, 100 mg at 04/01/22 0930   insulin aspart (novoLOG) injection 0-15 Units, 0-15 Units, Subcutaneous, TID WC, Amin, Soundra Pilon, MD, 3 Units at 04/01/22 0900   insulin aspart (novoLOG) injection 0-5 Units, 0-5 Units, Subcutaneous, QHS, Amin, Soundra Pilon, MD, 2 Units at 03/30/22 2130   ipratropium-albuterol (DUONEB) 0.5-2.5 (3) MG/3ML nebulizer solution 3 mL, 3 mL, Nebulization, Q4H PRN, Ottie Glazier, MD   lacosamide (VIMPAT) tablet 200 mg, 200 mg, Oral, BID, Su Monks M, MD, 200 mg at 04/01/22 0930   [COMPLETED] levETIRAcetam (KEPPRA) 4,000 mg in sodium chloride 0.9 % 250 mL IVPB, 4,000 mg, Intravenous, Once, Last Rate: 966.7 mL/hr at 04/01/22 1024, 4,000 mg at 04/01/22 1024 **FOLLOWED BY** levETIRAcetam (KEPPRA) IVPB 1000 mg/100 mL premix, 1,000 mg, Intravenous, Q12H, Derek Jack, MD   losartan (COZAAR) tablet 100 mg, 100 mg, Oral, Daily, Amin, Soundra Pilon, MD, 100 mg at 04/01/22 0929   montelukast (SINGULAIR) tablet 10 mg, 10 mg, Oral, QHS, Amin, Soundra Pilon, MD, 10 mg at 03/30/22 2130   nicotine (NICODERM CQ - dosed in mg/24 hours) patch 21 mg, 21 mg, Transdermal, Daily, Lorella Nimrod, MD, 21 mg at 04/01/22 0933   ondansetron (ZOFRAN) tablet 4 mg, 4 mg, Oral, Q6H PRN **OR** ondansetron (ZOFRAN) injection 4 mg, 4 mg, Intravenous,  Q6H PRN, Lorella Nimrod, MD   pantoprazole (PROTONIX) EC tablet 40 mg, 40 mg, Oral, Daily, Amin, Soundra Pilon, MD, 40 mg at 04/01/22 0932   polyethylene glycol (MIRALAX / GLYCOLAX) packet 17 g, 17 g, Oral, Daily PRN, Lorella Nimrod, MD   potassium chloride SA (KLOR-CON M) CR tablet 20 mEq, 20 mEq, Oral, Daily, Amin, Sumayya, MD, 20 mEq at 04/01/22 0932   [COMPLETED] methylPREDNISolone sodium succinate (SOLU-MEDROL) 40 mg/mL injection 40 mg, 40 mg, Intravenous, Q12H, 40 mg at 03/31/22 0129 **FOLLOWED BY** predniSONE (DELTASONE) tablet 40 mg, 40 mg, Oral, Q breakfast, Amin, Soundra Pilon, MD, 40 mg at 04/01/22 0858   rosuvastatin (CRESTOR) tablet 10 mg, 10 mg, Oral, QHS, Amin, Sumayya, MD, 10 mg at 03/30/22 2130   spironolactone (ALDACTONE) tablet 25 mg, 25 mg, Oral, Daily, Lorella Nimrod, MD, 25 mg at 04/01/22 0929   tiotropium (SPIRIVA) inhalation capsule ([facility] use ONLY) 18 mcg, 1 capsule, Inhalation, Daily, Lorella Nimrod, MD, 18 mcg at 03/31/22 0901  Vitals   Vitals:   04/01/22 0829 04/01/22 0900 04/01/22 1000 04/01/22 1100  BP: (!) 161/76 (!) 175/45 (!) 149/59 (!) 162/57  Pulse: 66 72 (!) 56 63  Resp: (!) 30 (!) 26 (!) 27 (!) 27  Temp: 97.8 F (36.6 C)     TempSrc: Oral     SpO2: 96% 94% 95% 99%  Weight:      Height:         Body  mass index is 30.5 kg/m.  Physical Exam   Physical Exam Gen: lethargic, oriented to self, son at bedside helps with interpretation HEENT: Atraumatic, normocephalic;mucous membranes moist; oropharynx clear, tongue without atrophy or fasciculations. Neck: Supple, trachea midline. Resp: CTAB, no w/r/r CV: RRR, no m/g/r; nml S1 and S2. 2+ symmetric peripheral pulses. Abd: soft/NT/ND; nabs x 4 quad Extrem: Nml bulk; no cyanosis, clubbing, or edema.  Neuro: *MS: lethargic, oriented to self, son at bedside helps with interpretation. Follows some simple commands and can mimic. *Speech: mod dysarthria, does not name or repeat *CN: PERRL, blinks to threat bilat, EOMI,  face symmetric, hearing severely impaired to voice *Motor: tremulous. Difficulty following formal strength exam, at least 4/5 strength bilat BUE and some movement against gravity BLE *Sensory: SILT *Coordination: UTA *Reflexes:  1+ and symmetric throughout without clonus; toes mute *Gait: deferred   Labs   CBC:  Recent Labs  Lab 03/30/22 0721 03/30/22 1519 03/31/22 0524  WBC 7.9 6.7 7.2  NEUTROABS 4.9  --   --   HGB 12.2 12.4 12.0  HCT 38.7 38.4 37.7  MCV 84.3 82.9 83.8  PLT 189 173 829    Basic Metabolic Panel:  Lab Results  Component Value Date   NA 144 03/31/2022   K 3.7 03/31/2022   CO2 27 03/31/2022   GLUCOSE 208 (H) 03/31/2022   BUN 17 03/31/2022   CREATININE 0.66 03/31/2022   CALCIUM 8.8 (L) 03/31/2022   GFRNONAA >60 03/31/2022   GFRAA >60 12/24/2019   Lipid Panel:  Lab Results  Component Value Date   LDLCALC 66 04/28/2021   HgbA1c:  Lab Results  Component Value Date   HGBA1C 6.2 (H) 12/04/2021   Urine Drug Screen:     Component Value Date/Time   LABOPIA NONE DETECTED 03/31/2022 0753   COCAINSCRNUR NONE DETECTED 03/31/2022 0753   LABBENZ POSITIVE (A) 03/31/2022 0753   AMPHETMU NONE DETECTED 03/31/2022 [place] DETECTED 03/31/2022 0753   LABBARB NONE DETECTED 03/31/2022 0753    Alcohol Level     Component Value Date/Time   ETH <10 09/29/2021 2136   Head CT wo contrast 9/24 NAICP, personal review  Impression   This is a 71 yo patient well known to our service with both epileptic seizures and non-epileptic spells admitted with SOB. She has had multiple witnessed seizure-like episodes of multiple types (staring off, focal shaking, unresponsiveness) since admission. She was on keppra 559m bid and vimpat 1581mbid as outpatient until recently she self-discontinued her keppra. She told Dr. ShManuella Ghazihis after their appt last week and he said as long as she was well-controlled it would be ok if she continued vimpat only and not the keppra. She  then missed at least one vimpat dose after that. On my examination she is lethargic likely 2/2 ativan but otherwise she is at her baseline.   Her breakthrough seizures were provoked in the setting of missing AED doses. Routine EEG this evening showed no seizures (no spells captured). No indication for transfer for cEEG at this time. If patient develops convulsive seizures lasting >5 min or multiple seizures without interval return to baseline, fluctuating mental status, or other signs concerning for seizure she would at [facility] point [facility].  Recommendations   - Vimpat 40072moad f/b 150m19md - Keppra 20mg52mload f/b 500mg 8m- Ativan 2mg x124mw - No indication for transfer for cEEG at this time. If patient develops convulsive seizures lasting >5  min or multiple seizures without interval return to baseline, fluctuating mental status, or other signs concerning for seizure she would at that point require transfer to [facility] for cEEG.  Will continue to follow  ______________________________________________________________________   Thank you for the opportunity to take part in the care of this patient. If you have any further questions, please contact the neurology consultation attending.  Signed,  Su Monks, MD [facility] [phone]  If 7pm- 7am, please page neurology on call as listed in [place].  **Any copied and pasted documentation in this note was written by me in another application not billed for and pasted by me into this document.

## 2022-04-01 NOTE — Progress Notes (Signed)
Neurology progress report  S: Patient continues to have frequent spells of staring unresponsive, occasionally accompanied by eye twitching. Vitals remain stable.  O:  Vitals:   04/01/22 1700 04/01/22 1800  BP: (!) 182/79 (!) 181/63  Pulse: 68 (!) 56  Resp: (!) 33 (!) 24  Temp:    SpO2: 96% 97%   Physical Exam Gen: lethargic, oriented to self HEENT: Atraumatic, normocephalic;mucous membranes moist; oropharynx clear, tongue without atrophy or fasciculations. Neck: Supple, trachea midline. Resp: CTAB, no w/r/r CV: RRR, no m/g/r; nml S1 and S2. 2+ symmetric peripheral pulses. Abd: soft/NT/ND; nabs x 4 quad Extrem: Nml bulk; no cyanosis, clubbing, or edema.   Neuro: *MS: lethargic, oriented to self. Follows some simple commands and can mimic. *Speech: mod dysarthria, does not name or repeat *CN: PERRL, blinks to threat bilat, EOMI, face symmetric, patient is deaf (some lip reading) *Motor: tremulous. 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  She is currently admitted to [facility] for acute on chronic respiratory failure in the setting of COPD exacerbation and hospitalization has been complicated by flash pulmonary edema.  Patient has had multiple seizure-like episodes since admission up to several per day.  As is typical for her these have displayed  variable semiology including staring off, focal shaking, and unresponsiveness.  She has received Ativan after nearly all of these episodes. Spell frequency has not been reduced by loading both keppra and vimpat and uptitrating to max dose on each (keppra max is renally dosed). Routine EEG 9/25 showed no seizures but no spells were captured during the recording. Based on semiology of persistent spells and fluctuating mental status I am concerned that she is having nonconvulsive seizures. She will require transfer to [facility] for cEEG.   Recommendations    - Give additional 4039m keppra for total of 4501mthis AM (6050mg status load) to be f/b 1000m46m12 hrs (max renal dosing) - Increase vimpat to 200mg31m - [facility]. [facility] - Only give ativan for seizure lasting >5 min or for event with unstable vital signs. 2/2 recurrent ativan administration patient is becoming increasingly lethargic. Notify MD if ativan is given - Seizure precautions  D/w Dr. Amin Reesa Chewhone  ColleSu MonksTri[facility] [phone]- 7am, please page neurology on call as listed in [place] copied and pasted documentation in this note was written by me in another application not billed for and pasted here.

## 2022-04-01 NOTE — Discharge Summary (Signed)
Physician Discharge Summary   Patient: [name] MRN: [id number] DOB: 08-15-1950  Admit date:     03/30/2022  Discharge date: 04/01/22  Discharge Physician: [name]   PCP: [facility]   Recommendations at discharge:  Patient is being transferred to [facility] for continuous EEG  Discharge Diagnoses: Principal Problem:   Respiratory failure, acute-on-chronic ([place]) Active Problems:   COPD exacerbation ([place])   Chronic diastolic heart failure ([place])   Essential hypertension   Seizure disorder ([place])   Diabetes mellitus without complication ([place])   Hypokalemia   Tobacco use   GERD (gastroesophageal reflux disease)   Neuropathy   Cirrhosis, non-alcoholic ([place])  Resolved Problems:   Seizures [facility])  Hospital Course: [name] is a 71 y.o. female with past medical history of heart failure, COPD, diabetes, seizures, cirrhosis, here with shortness of breath, gradually worsening over the past couple of week to the point that it really get worse yesterday and she was not being able to speak in full sentences despite using home oxygen all the time, at baseline she was mostly using at night and occasionally with ambulation.  Patient was little somnolent after getting Ativan for seizure during admission.  Most of the history was obtained from son on phone.  Per son patient was also having worsening cough with some sputum production, no fever or chills. Some decrease in appetite for the past couple of weeks but she was able to eat dinner with the family.  No chest pain.  No urinary symptoms.  ED course.  Patient has hemodynamically stable on arrival.  CBC and CMP without any significant abnormality.  Troponin negative.  BNP at 603.5. COVID and influenza PCR negative. Chest x-ray with concern of mild congestion. CT head with no acute abnormalities.  Patient developed a witnessed seizure while in the ED during that time her blood pressure increased above  832 systolic.  She was given a Keppra load and Ativan.  Apparently missed her morning dose of Vimpat.  She was also given Solu-Medrol and DuoNeb. Placed on BiPAP for concern of hypercarbia and decreased mental status with seizure.  Able to wean her back to 4 L of oxygen.  She was admitted for acute on chronic respiratory failure most likely secondary to COPD exacerbation.  9/25: Patient continued to have seizures despite Keppra load in ED and restarting home Vimpat.  Neurology was consulted and they ordered Vimpat load. Had 3 seizures since this morning requiring Ativan. Patient was somnolent when seen today during morning rounds as she received second dose of Ativan recently. UA positive for nitrites and bacteria.  No leukocytes-added culture.  Holding antibiotics for now.  9/26: Patient continued to have dozing on and off episodes where occasionally noted some shaking.  She all of a sudden stopped responding.  Received a dose of Ativan.  Because of multiple consecutive episodes there was a concern of status epilepticus versus continuation of underlying frequent nonclinical seizures, neurology increase the dose of Keppra and Vimpat and she was reloaded with Keppra. They are recommending transferring to [facility] for continuous EEG as EEG done yesterday did not recorded any seizure activity.  [facility] and patient is being transferred to [facility] for further care. She will continue on current management. Urine cultures pending.  Assessment and Plan: * Respiratory failure, acute-on-chronic (HCC) Currently on 4 L of oxygen. Patient with history of COPD and uses 2 L of oxygen most of the time at baseline.  Continues  to smoke. Initially visiting on arrival to ED, ABG with hypercarbia and significant shortness of breath so she was placed on BiPAP.  She received Solu-Medrol and breathing treatment along with one-time dose of IV Lasix for concern of flash pulmonary edema. -Continue  supplemental oxygen -Solu-Medrol followed by prednisone for 5 days -Zithromax for 5 days for COPD exacerbation -Continue with supplemental oxygen -Bronchodilators -Supportive care  COPD exacerbation ([place]) See above  Chronic diastolic heart failure ([place]) Patient developed flash pulmonary edema most likely with significantly elevated blood pressure with seizure. Received one-time dose of IV Lasix. Chest clear on exam.  Troponin negative. Echocardiogram done and March 2023 with grade 1 diastolic dysfunction and mild to moderate aortic stenosis. BNP elevated at 603. -Continue home Lasix and spironolactone  Essential hypertension Currently blood pressure within goal. -Restart home meds which include Lasix, spironolactone and losartan from tomorrow. -Will restart metoprolol if heart rate started trending up, currently in 70s   Seizure disorder ([place]) Patient continued to have seizures despite Keppra load and restarting home Vimpat.  She missed couple of doses of home Vimpat before coming to the hospital. Neurology was consulted and she was given a Vimpat load.  Potassium and magnesium within normal limit. -Continue with Vimpat -Seizure precaution -Ativan as needed  Diabetes mellitus without complication (HCC) CBG currently within goal.  Uses glipizide at home. -Check A1c -SSI -Monitor closely as she is being getting steroid  Hypokalemia Resolved.  Magnesium within normal limit Patient is on potassium supplement at home. -Continue home potassium supplement at 20 mEq daily  Tobacco use Current smoker of 1 pack/day. -Nicotine patch  GERD (gastroesophageal reflux disease) - Continue home Protonix  Neuropathy - Continue home gabapentin at 100 mg twice daily  Cirrhosis, non-alcoholic ([place]) Appears well compensated. -Continue to monitor         Consultants: Neurology Procedures performed: EEG Disposition:  Transfer to [facility] Diet recommendation:  Discharge Diet  Orders (From admission, onward)     Start     Ordered   04/01/22 0000  Diet - low sodium heart healthy        04/01/22 1730           Cardiac and Carb modified diet DISCHARGE MEDICATION: Allergies as of 04/01/2022       Reactions   Aspirin Itching   Celebrex [celecoxib] Itching   itching   Ciprofloxacin Itching   Codeine Itching   Fosphenytoin Itching   Levaquin [levofloxacin In D5w] Itching   Levofloxacin Itching   Lovastatin Itching   Penicillins    Documentation indicates severe reaction Pt tolerated cephalosporin without adverse reaction 09/18   Pravastatin Itching   Sulfa Antibiotics Itching        Medication List     STOP taking these medications    MAGOX 400 PO   rOPINIRole 0.5 MG tablet Commonly known as: REQUIP       TAKE these medications    acetaminophen 325 MG tablet Commonly known as: TYLENOL Take 2 tablets (650 mg total) by mouth every 6 (six) hours as needed for mild pain (or Fever >/= 101).   albuterol 108 (90 Base) MCG/ACT inhaler Commonly known as: VENTOLIN HFA Inhale 2 puffs into the lungs every 4 (four) hours as needed for wheezing or shortness of breath.   azithromycin 250 MG tablet Commonly known as: ZITHROMAX 1 tablet daily for 3 more days Start taking on: April 02, 2022   citalopram 10 MG tablet Commonly known as: CELEXA Take 10 mg  by mouth daily.   clopidogrel 75 MG tablet Commonly known as: PLAVIX Take 1 tablet (75 mg total) by mouth daily.   feeding supplement Liqd Take 237 mLs by mouth 3 (three) times daily between meals.   Flonase Allergy Relief 50 MCG/ACT nasal spray Generic drug: fluticasone Place 1 spray into both nostrils 2 (two) times daily.   furosemide 40 MG tablet Commonly known as: LASIX Take 40 mg by mouth daily.   gabapentin 100 MG capsule Commonly known as: NEURONTIN Take 100 mg by mouth 2 (two) times daily.   glimepiride 2 MG tablet Commonly known as: AMARYL Take 2 mg by mouth daily.    guaiFENesin 600 MG 12 hr tablet Commonly known as: Mucinex Take 1 tablet (600 mg total) by mouth 2 (two) times daily.   insulin aspart 100 UNIT/ML injection Commonly known as: novoLOG Inject 0-15 Units into the skin 3 (three) times daily with meals.   insulin aspart 100 UNIT/ML injection Commonly known as: novoLOG Inject 0-5 Units into the skin at bedtime.   ipratropium-albuterol 0.5-2.5 (3) MG/3ML Soln Commonly known as: DUONEB Take 3 mLs by nebulization in the morning, at noon, and at bedtime for 5 days.   lacosamide 200 MG Tabs tablet Commonly known as: VIMPAT Take 1 tablet (200 mg total) by mouth 2 (two) times daily. What changed:  medication strength how much to take   levETIRAcetam 1000 MG/100ML Soln Commonly known as: KEPPRA Inject 100 mLs (1,000 mg total) into the vein every 12 (twelve) hours.   losartan 100 MG tablet Commonly known as: COZAAR Take 100 mg by mouth daily.   metoprolol succinate 25 MG 24 hr tablet Commonly known as: TOPROL-XL Take 1 tablet (25 mg total) by mouth daily. Hold for bradycardia What changed: additional instructions   montelukast 10 MG tablet Commonly known as: SINGULAIR Take 10 mg by mouth at bedtime.   pantoprazole 40 MG tablet Commonly known as: PROTONIX Take 40 mg by mouth daily.   potassium chloride 10 MEQ tablet Commonly known as: KLOR-CON Take 2 tablets (20 mEq total) by mouth daily.   predniSONE 20 MG tablet Commonly known as: DELTASONE Take 2 tablets (40 mg total) by mouth daily with breakfast. Start taking on: April 02, 2022 What changed:  medication strength how much to take how to take this when to take this additional instructions   rosuvastatin 10 MG tablet Commonly known as: CRESTOR Take 10 mg by mouth at bedtime.   Spiriva Respimat 2.5 MCG/ACT Aers Generic drug: Tiotropium Bromide Monohydrate Inhale 2 puff as directed once a day   spironolactone 25 MG tablet Commonly known as: ALDACTONE Take  25 mg by mouth daily.        Discharge Exam: Filed Weights   03/30/22 0722 03/30/22 1519 04-22-2022 1535  Weight: 74 kg 77.2 kg 78.1 kg   General.  Somnolent elderly lady, in no acute distress. Pulmonary.  Lungs clear bilaterally, normal respiratory effort. CV.  Regular rate and rhythm, no JVD, rub or murmur. Abdomen.  Soft, nontender, nondistended, BS positive. CNS.  Somnolent but following some commands. Extremities.  No edema, no cyanosis, pulses intact and symmetrical. Psychiatry.  Judgment and insight appears impaired.   Condition at discharge: stable  The results of significant diagnostics from this hospitalization (including imaging, microbiology, ancillary and laboratory) are listed below for reference.   Imaging Studies: EEG adult  Result Date: 04/22/22 Derek Jack, MD     04/22/2022  7:26 PM Routine EEG Report Marcene Brawn  is a 71 y.o. female with a history of seizures who is undergoing an EEG to evaluate for seizures. Report: This EEG was acquired with electrodes placed according to the International 10-20 electrode system (including Fp1, Fp2, F3, F4, C3, C4, P3, P4, O1, O2, T3, T4, T5, T6, A1, A2, Fz, Cz, Pz). The following electrodes were missing or displaced: none. The occipital dominant rhythm was 6 Hz with intermittent more pronounced diffuse slowing. This activity is reactive to stimulation. Drowsiness was manifested by background fragmentation; deeper stages of sleep were not identified. There was no focal slowing. There were no interictal epileptiform discharges. There were no electrographic seizures identified. Photic stimulation and hyperventilation were not performed. Impression and clinical correlation: This EEG was obtained while awake and drowsy and is abnormal due to mild-to-moderate diffuse slowing indicative of global cerebral dysfunction. Epileptiform abnormalities were not seen during this recording. Su Monks, MD [facility] [phone]  If 7pm- 7am, please page neurology on call as listed in [place].   DG Chest Port 1 View  Result Date: 03/31/2022 CLINICAL DATA:  Increased shortness of breath EXAM: PORTABLE CHEST 1 VIEW COMPARISON:  03/30/2022 FINDINGS: Heart is upper limits normal in size. Mediastinal contours within normal limits. Aortic atherosclerosis. No confluent opacities or effusions. No acute bony abnormality. Prior right shoulder replacement. IMPRESSION: No active disease. Electronically Signed   By: Rolm Baptise M.D.   On: 03/31/2022 18:19   CT HEAD WO CONTRAST (5MM)  Result Date: 03/30/2022 CLINICAL DATA:  Worsening headache. EXAM: CT HEAD WITHOUT CONTRAST TECHNIQUE: Contiguous axial images were obtained from the base of the skull through the vertex without intravenous contrast. RADIATION DOSE REDUCTION: This exam was performed according to the departmental dose-optimization program which includes automated exposure control, adjustment of the mA and/or kV according to patient size and/or use of iterative reconstruction technique. COMPARISON:  April 28, 2021 FINDINGS: Brain: No evidence of acute infarction, hemorrhage, hydrocephalus, extra-axial collection or mass lesion/mass effect. Vascular: No hyperdense vessel or unexpected calcification. Skull: Normal. Negative for fracture or focal lesion. Sinuses/Orbits: No acute finding. Other: None. IMPRESSION: No acute abnormalities.  No cause for headache identified. Electronically Signed   By: Dorise Bullion III M.D.   On: 03/30/2022 13:03   DG Chest 2 View  Result Date: 03/30/2022 CLINICAL DATA:  72 year old female with history of cough, shortness of breath and history of COPD. EXAM: CHEST - 2 VIEW COMPARISON:  Chest x-ray 12/03/2021. FINDINGS: There is cephalization of the pulmonary vasculature and slight indistinctness of the interstitial markings suggestive of mild pulmonary edema. No definite pleural effusions. No pneumothorax. Heart size is mildly enlarged. Upper  mediastinal contours are within normal limits. Atherosclerotic calcifications are noted in the thoracic aorta. Status post right shoulder arthroplasty. IMPRESSION: 1. The appearance the chest is most suggestive of mild congestive heart failure, as above. 2. Aortic atherosclerosis. Electronically Signed   By: Vinnie Langton M.D.   On: 03/30/2022 07:56    Microbiology: Results for orders placed or performed during the hospital encounter of 03/30/22  SARS Coronavirus 2 by RT PCR (hospital order, performed in [facility] hospital lab) *cepheid single result test* Anterior Nasal Swab     Status: None   Collection Time: 03/30/22  7:21 AM   Specimen: Anterior Nasal Swab  Result Value Ref Range Status   SARS Coronavirus 2 by RT PCR NEGATIVE NEGATIVE Final    Comment: (NOTE) SARS-CoV-2 target nucleic acids are NOT DETECTED.  The SARS-CoV-2 RNA is generally detectable in upper and lower  respiratory specimens during the acute phase of infection. The lowest concentration of SARS-CoV-2 viral copies this assay can detect is 250 copies / mL. A negative result does not preclude SARS-CoV-2 infection and should not be used as the sole basis for treatment or other patient management decisions.  A negative result may occur with improper specimen collection / handling, submission of specimen other than nasopharyngeal swab, presence of viral mutation(s) within the areas targeted by this assay, and inadequate number of viral copies (<250 copies / mL). A negative result must be combined with clinical observations, patient history, and epidemiological information.  Fact Sheet for Patients:   https://www.patel.info/  Fact Sheet for Healthcare Providers: https://hall.com/  This test is not yet approved or  cleared by the [place] FDA and has been authorized for detection and/or diagnosis of SARS-CoV-2 by FDA under an Emergency Use Authorization (EUA).  This EUA  will remain in effect (meaning this test can be used) for the duration of the COVID-19 declaration under Section 564(b)(1) of the Act, 21 U.S.C. section 360bbb-3(b)(1), unless the authorization is terminated or revoked sooner.  Performed at [facility], [address], [place], [place] [address]   Urine Culture     Status: None (Preliminary result)   Collection Time: 03/31/22  2:00 PM   Specimen: Urine, Clean Catch  Result Value Ref Range Status   Specimen Description   Final    URINE, CLEAN CATCH Performed at [facility], [address], [place], [place] [address]    Special Requests   Final    NONE Performed at [facility], [address], [place], [place] [address]    Culture   Final    CULTURE REINCUBATED FOR BETTER GROWTH Performed at [facility], [place] [address], [place], [place] [address]    Report Status PENDING  Incomplete  MRSA Next Gen by PCR, Nasal     Status: None   Collection Time: 03/31/22  3:42 PM   Specimen: Nasal Mucosa; Nasal Swab  Result Value Ref Range Status   MRSA by PCR Next Gen NOT DETECTED NOT DETECTED Final    Comment: (NOTE) The GeneXpert MRSA Assay (FDA approved for NASAL specimens only), is one component of a comprehensive MRSA colonization surveillance program. It is not intended to diagnose MRSA infection nor to guide or monitor treatment for MRSA infections. Test performance is not FDA approved in patients less than 2 years old. Performed at [facility], [address] [place], [place] [address]     Labs: CBC: Recent Labs  Lab 03/30/22 0721 03/30/22 1519 03/31/22 0524  WBC 7.9 6.7 7.2  NEUTROABS 4.9  --   --   HGB 12.2 12.4 12.0  HCT 38.7 38.4 37.7  MCV 84.3 82.9 83.8  PLT 189 173 818   Basic Metabolic Panel: Recent Labs  Lab 03/30/22 0721 03/30/22 1519 03/31/22 0524  NA 143  --  144  K 3.4*  --  3.7  CL 112*  --  109  CO2 25  --  27  GLUCOSE 114*  --  208*  BUN 8   --  17  CREATININE 0.56 0.62 0.66  CALCIUM 8.7*  --  8.8*  MG  --   --  1.9   Liver Function Tests: Recent Labs  Lab 03/30/22 0721  AST 19  ALT 11  ALKPHOS 83  BILITOT 0.8  PROT 6.4*  ALBUMIN 3.4*   CBG: Recent Labs  Lab 03/31/22 1545 03/31/22  2203 04/01/22 0718 04/01/22 1143 04/01/22 1724  GLUCAP 93 176* 197* 167* 170*    Discharge time spent: greater than 30 minutes.  This record has been created using Systems analyst. Errors have been sought and corrected,but may not always be located. Such creation errors do not reflect on the standard of care.   Signed: Lorella Nimrod, MD [facility] 04/01/2022

## 2022-04-01 NOTE — Plan of Care (Signed)
Transferred from [facility] to [facility] Mrs. Passey is a 71 year old female with pmh HFpEF, COPD, DM seizures, and cirrhosis who initially presented for shortness of breath secondary to a COPD exacerbation.  Patient had been noted to have seizures thought secondary to missed doses of her AED medications.  Neurology had been consulted and medications have been resumed.  Despite this overnight patient in and out thought possibly to have seizures for which neurology recommended transfer for need of continuous AED monitoring.  Blood gas did not note any signs of hypercapnia as cause.  Labs yesterday have been relatively stable.  Accepted to a progressive bed.

## 2022-04-01 NOTE — Progress Notes (Signed)
ASL interpreter arrived to the unit. This RN went to communicate with the patient via interpreter. Patient verbalized she did not have any questions, she was hungry and thirsty.  The ASL interpreter signed all parts of conversation to patient. The patient was notified of the plans to go to [facility] for further EEG/neurology monitoring. The patient stated she does not want to go and she likes it here (at [facility]). This RN explained that we do not have the monitoring abilities here and she needs to be transferred for further seizure evaluations. The patient continued to verbalize that she does not want to go. She then said she is thirsty and hungry, then closed her eyes and would not interact with this RN or interpreter. Dinner tray was set up and placed in front of the patient for her to eat and drink from if she decided to do so.   This RN contacted the son (patient's POA) to clarify the patient's baseline behavior. He explained that he was not aware of the request for an in person ASL interpreter and was satisfied with the communication between the staff and his mother. He stated that before he left her today, he had explained to her the plan of going to [facility], she resisted with him but was agreeable once he explained to her the reasoning. He also noted that he does not sign to communicate with her. She has been deaf for 30 years and has used ASL to communicate but since having a shoulder surgery her use of ASL has significantly decreased and she routinely communicates with him through speech and reading lips. He stated that when she is not in the hospital, her speech is much clearer and he notes a difference/change in speech whenever she is in a healthcare setting.

## 2022-04-01 NOTE — Progress Notes (Signed)
Patient continues to state that she does not wear cpap/bipap at home, only O2, no indication at this time.

## 2022-04-02 ENCOUNTER — Inpatient Hospital Stay: Payer: Medicare Other

## 2022-04-02 ENCOUNTER — Encounter (HOSPITAL_COMMUNITY): Payer: Self-pay | Admitting: Internal Medicine

## 2022-04-02 ENCOUNTER — Inpatient Hospital Stay (HOSPITAL_COMMUNITY): Payer: Medicare Other

## 2022-04-02 ENCOUNTER — Inpatient Hospital Stay (HOSPITAL_COMMUNITY)
Admission: RE | Admit: 2022-04-02 | Discharge: 2022-04-07 | DRG: 100 | Disposition: A | Discharge status: Home Health | Payer: Medicare Other | Source: Other Acute Inpatient Hospital | Attending: Internal Medicine | Admitting: Internal Medicine

## 2022-04-02 DIAGNOSIS — J9622 Acute and chronic respiratory failure with hypercapnia: Secondary | ICD-10-CM | POA: Diagnosis present

## 2022-04-02 DIAGNOSIS — E1140 Type 2 diabetes mellitus with diabetic neuropathy, unspecified: Secondary | ICD-10-CM | POA: Diagnosis present

## 2022-04-02 DIAGNOSIS — R569 Unspecified convulsions: Secondary | ICD-10-CM | POA: Diagnosis not present

## 2022-04-02 DIAGNOSIS — G2581 Restless legs syndrome: Secondary | ICD-10-CM | POA: Diagnosis present

## 2022-04-02 DIAGNOSIS — Z79899 Other long term (current) drug therapy: Secondary | ICD-10-CM | POA: Diagnosis not present

## 2022-04-02 DIAGNOSIS — I5032 Chronic diastolic (congestive) heart failure: Secondary | ICD-10-CM | POA: Diagnosis not present

## 2022-04-02 DIAGNOSIS — E119 Type 2 diabetes mellitus without complications: Secondary | ICD-10-CM | POA: Diagnosis not present

## 2022-04-02 DIAGNOSIS — J441 Chronic obstructive pulmonary disease with (acute) exacerbation: Secondary | ICD-10-CM | POA: Diagnosis present

## 2022-04-02 DIAGNOSIS — B961 Klebsiella pneumoniae [K. pneumoniae] as the cause of diseases classified elsewhere: Secondary | ICD-10-CM | POA: Diagnosis present

## 2022-04-02 DIAGNOSIS — Z794 Long term (current) use of insulin: Secondary | ICD-10-CM | POA: Diagnosis not present

## 2022-04-02 DIAGNOSIS — I110 Hypertensive heart disease with heart failure: Secondary | ICD-10-CM | POA: Diagnosis present

## 2022-04-02 DIAGNOSIS — I10 Essential (primary) hypertension: Secondary | ICD-10-CM | POA: Diagnosis not present

## 2022-04-02 DIAGNOSIS — M069 Rheumatoid arthritis, unspecified: Secondary | ICD-10-CM | POA: Diagnosis present

## 2022-04-02 DIAGNOSIS — G4730 Sleep apnea, unspecified: Secondary | ICD-10-CM | POA: Diagnosis present

## 2022-04-02 DIAGNOSIS — J9621 Acute and chronic respiratory failure with hypoxia: Secondary | ICD-10-CM | POA: Diagnosis present

## 2022-04-02 DIAGNOSIS — G9341 Metabolic encephalopathy: Secondary | ICD-10-CM

## 2022-04-02 DIAGNOSIS — K589 Irritable bowel syndrome without diarrhea: Secondary | ICD-10-CM | POA: Diagnosis present

## 2022-04-02 DIAGNOSIS — I5021 Acute systolic (congestive) heart failure: Secondary | ICD-10-CM

## 2022-04-02 DIAGNOSIS — K7460 Unspecified cirrhosis of liver: Secondary | ICD-10-CM | POA: Diagnosis present

## 2022-04-02 DIAGNOSIS — H9190 Unspecified hearing loss, unspecified ear: Secondary | ICD-10-CM | POA: Diagnosis present

## 2022-04-02 DIAGNOSIS — I5033 Acute on chronic diastolic (congestive) heart failure: Secondary | ICD-10-CM | POA: Diagnosis present

## 2022-04-02 DIAGNOSIS — G40901 Epilepsy, unspecified, not intractable, with status epilepticus: Principal | ICD-10-CM | POA: Diagnosis present

## 2022-04-02 DIAGNOSIS — Z880 Allergy status to penicillin: Secondary | ICD-10-CM

## 2022-04-02 DIAGNOSIS — E876 Hypokalemia: Secondary | ICD-10-CM | POA: Diagnosis present

## 2022-04-02 DIAGNOSIS — I5020 Unspecified systolic (congestive) heart failure: Secondary | ICD-10-CM | POA: Diagnosis not present

## 2022-04-02 DIAGNOSIS — M1990 Unspecified osteoarthritis, unspecified site: Secondary | ICD-10-CM | POA: Diagnosis present

## 2022-04-02 DIAGNOSIS — Z9981 Dependence on supplemental oxygen: Secondary | ICD-10-CM

## 2022-04-02 DIAGNOSIS — Z888 Allergy status to other drugs, medicaments and biological substances status: Secondary | ICD-10-CM

## 2022-04-02 DIAGNOSIS — I161 Hypertensive emergency: Secondary | ICD-10-CM | POA: Diagnosis present

## 2022-04-02 DIAGNOSIS — Z885 Allergy status to narcotic agent status: Secondary | ICD-10-CM

## 2022-04-02 DIAGNOSIS — N390 Urinary tract infection, site not specified: Secondary | ICD-10-CM | POA: Diagnosis present

## 2022-04-02 DIAGNOSIS — Z7984 Long term (current) use of oral hypoglycemic drugs: Secondary | ICD-10-CM

## 2022-04-02 DIAGNOSIS — R4701 Aphasia: Secondary | ICD-10-CM | POA: Diagnosis not present

## 2022-04-02 DIAGNOSIS — F17210 Nicotine dependence, cigarettes, uncomplicated: Secondary | ICD-10-CM | POA: Diagnosis present

## 2022-04-02 DIAGNOSIS — Z8673 Personal history of transient ischemic attack (TIA), and cerebral infarction without residual deficits: Secondary | ICD-10-CM

## 2022-04-02 DIAGNOSIS — Z886 Allergy status to analgesic agent status: Secondary | ICD-10-CM

## 2022-04-02 DIAGNOSIS — I160 Hypertensive urgency: Secondary | ICD-10-CM | POA: Diagnosis present

## 2022-04-02 DIAGNOSIS — K219 Gastro-esophageal reflux disease without esophagitis: Secondary | ICD-10-CM | POA: Diagnosis present

## 2022-04-02 DIAGNOSIS — G4089 Other seizures: Secondary | ICD-10-CM | POA: Diagnosis not present

## 2022-04-02 DIAGNOSIS — Z91148 Patient's other noncompliance with medication regimen for other reason: Secondary | ICD-10-CM

## 2022-04-02 DIAGNOSIS — G40001 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 with seizures of localized onset, not intractable, with status epilepticus: Secondary | ICD-10-CM

## 2022-04-02 DIAGNOSIS — F32A Depression, unspecified: Secondary | ICD-10-CM | POA: Diagnosis not present

## 2022-04-02 DIAGNOSIS — Z8249 Family history of ischemic heart disease and other diseases of the circulatory system: Secondary | ICD-10-CM

## 2022-04-02 DIAGNOSIS — I674 Hypertensive encephalopathy: Secondary | ICD-10-CM | POA: Diagnosis present

## 2022-04-02 DIAGNOSIS — Z881 Allergy status to other antibiotic agents status: Secondary | ICD-10-CM

## 2022-04-02 DIAGNOSIS — G629 Polyneuropathy, unspecified: Secondary | ICD-10-CM

## 2022-04-02 DIAGNOSIS — Z7902 Long term (current) use of antithrombotics/antiplatelets: Secondary | ICD-10-CM

## 2022-04-02 DIAGNOSIS — Z801 Family history of malignant neoplasm of trachea, bronchus and lung: Secondary | ICD-10-CM

## 2022-04-02 DIAGNOSIS — Z720 Tobacco use: Secondary | ICD-10-CM

## 2022-04-02 DIAGNOSIS — J449 Chronic obstructive pulmonary disease, unspecified: Secondary | ICD-10-CM | POA: Diagnosis not present

## 2022-04-02 DIAGNOSIS — Z803 Family history of malignant neoplasm of breast: Secondary | ICD-10-CM

## 2022-04-02 DIAGNOSIS — G40909 Epilepsy, unspecified, not intractable, without status epilepticus: Secondary | ICD-10-CM | POA: Diagnosis not present

## 2022-04-02 DIAGNOSIS — Z781 Physical restraint status: Secondary | ICD-10-CM

## 2022-04-02 DIAGNOSIS — Z882 Allergy status to sulfonamides status: Secondary | ICD-10-CM

## 2022-04-02 LAB — COMPREHENSIVE METABOLIC PANEL
ALT: 14 U/L (ref 0–44)
AST: 21 U/L (ref 15–41)
Albumin: 3.8 g/dL (ref 3.5–5.0)
Alkaline Phosphatase: 79 U/L (ref 38–126)
Anion gap: 10 (ref 5–15)
BUN: 21 mg/dL (ref 8–23)
CO2: 31 mmol/L (ref 22–32)
Calcium: 9.3 mg/dL (ref 8.9–10.3)
Chloride: 102 mmol/L (ref 98–111)
Creatinine, Ser: 0.65 mg/dL (ref 0.44–1.00)
GFR, Estimated: >60 mL/min (ref >60)
Glucose, Bld: 149 mg/dL — ABNORMAL HIGH (ref 70–99)
Potassium: 3.5 mmol/L (ref 3.5–5.1)
Sodium: 143 mmol/L (ref 135–145)
Total Bilirubin: 0.8 mg/dL (ref 0.3–1.2)
Total Protein: 7.6 g/dL (ref 6.5–8.1)

## 2022-04-02 LAB — BLOOD GAS, ARTERIAL
Acid-Base Excess: 11.4 mmol/L — ABNORMAL HIGH (ref 0.0–2.0)
Bicarbonate: 35.1 mmol/L — ABNORMAL HIGH (ref 20.0–28.0)
Drawn by: 51910
O2 Content: 3 L/min
O2 Saturation: 92.3 %
Patient temperature: 37
pCO2 arterial: 41 mmHg (ref 32–48)
pH, Arterial: 7.54 — ABNORMAL HIGH (ref 7.35–7.45)
pO2, Arterial: 58 mmHg — ABNORMAL LOW (ref 83–108)

## 2022-04-02 LAB — CBC
HCT: 45 % (ref 36.0–46.0)
Hemoglobin: 14.3 g/dL (ref 12.0–15.0)
MCH: 26.5 pg (ref 26.0–34.0)
MCHC: 31.8 g/dL (ref 30.0–36.0)
MCV: 83.3 fL (ref 80.0–100.0)
Platelets: 220 10*3/uL (ref 150–400)
RBC: 5.4 MIL/uL — ABNORMAL HIGH (ref 3.87–5.11)
RDW: 13.8 % (ref 11.5–15.5)
WBC: 8.9 10*3/uL (ref 4.0–10.5)
nRBC: 0 % (ref 0.0–0.2)

## 2022-04-02 LAB — GLUCOSE, CAPILLARY
Glucose-Capillary: 141 mg/dL — ABNORMAL HIGH (ref 70–99)
Glucose-Capillary: 157 mg/dL — ABNORMAL HIGH (ref 70–99)
Glucose-Capillary: 172 mg/dL — ABNORMAL HIGH (ref 70–99)
Glucose-Capillary: 177 mg/dL — ABNORMAL HIGH (ref 70–99)
Glucose-Capillary: 194 mg/dL — ABNORMAL HIGH (ref 70–99)

## 2022-04-02 LAB — AMMONIA: Ammonia: 19 umol/L (ref 9–35)

## 2022-04-02 MED ORDER — HYDRALAZINE HCL 20 MG/ML IJ SOLN
10.0000 mg | INTRAMUSCULAR | Status: DC | PRN
  Administered 2022-04-05 – 2022-04-06 (×2): 10 mg via INTRAVENOUS
  Filled 2022-04-02 (×2): qty 1

## 2022-04-02 MED ORDER — LABETALOL HCL 5 MG/ML IV SOLN
5.0000 mg | INTRAVENOUS | Status: DC | PRN
  Administered 2022-04-02: 5 mg via INTRAVENOUS
  Filled 2022-04-02: qty 4

## 2022-04-02 MED ORDER — CLOPIDOGREL BISULFATE 75 MG PO TABS
75.0000 mg | ORAL_TABLET | Freq: Every day | ORAL | Status: DC
  Administered 2022-04-02: 75 mg
  Filled 2022-04-02: qty 1

## 2022-04-02 MED ORDER — POLYETHYLENE GLYCOL 3350 17 G PO PACK: 17.0000 g | PACK | Freq: Every day | ORAL | Status: DC | PRN

## 2022-04-02 MED ORDER — SODIUM CHLORIDE 0.9 % IV SOLN: 1.0000 g | INTRAVENOUS | Status: DC

## 2022-04-02 MED ORDER — FREE WATER: 30.0000 mL | Status: DC

## 2022-04-02 MED ORDER — METHYLPREDNISOLONE SODIUM SUCC 40 MG IJ SOLR
40.0000 mg | Freq: Every day | INTRAMUSCULAR | Status: DC
  Administered 2022-04-02: 40 mg via INTRAVENOUS
  Filled 2022-04-02: qty 1

## 2022-04-02 MED ORDER — DOCUSATE SODIUM 100 MG PO CAPS: 100.0000 mg | ORAL_CAPSULE | Freq: Two times a day (BID) | ORAL | Status: DC | PRN

## 2022-04-02 MED ORDER — METHYLPREDNISOLONE SODIUM SUCC 40 MG IJ SOLR: 40.0000 mg | Freq: Every day | INTRAMUSCULAR | 0 refills | Status: DC

## 2022-04-02 MED ORDER — SODIUM CHLORIDE 0.9 % IV SOLN
1.0000 g | INTRAVENOUS | Status: DC
  Administered 2022-04-02: 1 g via INTRAVENOUS
  Filled 2022-04-02: qty 1

## 2022-04-02 MED ORDER — MONTELUKAST SODIUM 10 MG PO TABS: 10.0000 mg | ORAL_TABLET | Freq: Every day | ORAL | Status: DC

## 2022-04-02 MED ORDER — LACOSAMIDE 200 MG PO TABS: 200.0000 mg | ORAL_TABLET | Freq: Two times a day (BID) | ORAL | Status: DC

## 2022-04-02 MED ORDER — HYDRALAZINE HCL 20 MG/ML IJ SOLN: 5.0000 mg | INTRAMUSCULAR | Status: DC | PRN

## 2022-04-02 MED ORDER — LORAZEPAM 2 MG/ML IJ SOLN: 2.0000 mg | INTRAMUSCULAR | Status: DC | PRN

## 2022-04-02 MED ORDER — CITALOPRAM HYDROBROMIDE 10 MG PO TABS: 10.0000 mg | ORAL_TABLET | Freq: Every day | ORAL | Status: DC

## 2022-04-02 MED ORDER — LEVETIRACETAM IN NACL 1000 MG/100ML IV SOLN
1000.0000 mg | Freq: Two times a day (BID) | INTRAVENOUS | Status: DC
  Administered 2022-04-02 – 2022-04-06 (×9): 1000 mg via INTRAVENOUS
  Filled 2022-04-02 (×9): qty 100

## 2022-04-02 MED ORDER — AZITHROMYCIN 250 MG PO TABS: 250.0000 mg | ORAL_TABLET | Freq: Every day | ORAL | Status: DC

## 2022-04-02 MED ORDER — POTASSIUM CHLORIDE 10 MEQ/100ML IV SOLN
10.0000 meq | INTRAVENOUS | Status: AC
  Administered 2022-04-03 (×3): 10 meq via INTRAVENOUS
  Filled 2022-04-02 (×3): qty 100

## 2022-04-02 MED ORDER — LOSARTAN POTASSIUM 100 MG PO TABS: 100.0000 mg | ORAL_TABLET | Freq: Every day | ORAL | Status: DC

## 2022-04-02 MED ORDER — GABAPENTIN 250 MG/5ML PO SOLN: 100.0000 mg | Freq: Two times a day (BID) | ORAL | 12 refills | Status: DC

## 2022-04-02 MED ORDER — DEXTROSE 5 % IV SOLN
250.0000 mg | Freq: Every day | INTRAVENOUS | Status: DC
  Administered 2022-04-03: 250 mg via INTRAVENOUS
  Filled 2022-04-02 (×2): qty 2.5

## 2022-04-02 MED ORDER — LACOSAMIDE 50 MG PO TABS
200.0000 mg | ORAL_TABLET | Freq: Two times a day (BID) | ORAL | Status: DC
  Administered 2022-04-02: 200 mg
  Filled 2022-04-02: qty 4

## 2022-04-02 MED ORDER — SPIRONOLACTONE 25 MG PO TABS: 25.0000 mg | ORAL_TABLET | Freq: Every day | ORAL | Status: DC

## 2022-04-02 MED ORDER — INSULIN ASPART 100 UNIT/ML IJ SOLN
0.0000 [IU] | INTRAMUSCULAR | Status: DC
  Administered 2022-04-02: 3 [IU] via SUBCUTANEOUS
  Administered 2022-04-03 (×2): 2 [IU] via SUBCUTANEOUS
  Administered 2022-04-03: 5 [IU] via SUBCUTANEOUS
  Administered 2022-04-04 (×2): 2 [IU] via SUBCUTANEOUS
  Administered 2022-04-04: 5 [IU] via SUBCUTANEOUS
  Administered 2022-04-04: 3 [IU] via SUBCUTANEOUS
  Administered 2022-04-05: 2 [IU] via SUBCUTANEOUS
  Administered 2022-04-05 (×2): 3 [IU] via SUBCUTANEOUS
  Administered 2022-04-05: 5 [IU] via SUBCUTANEOUS

## 2022-04-02 MED ORDER — SODIUM CHLORIDE 0.9 % IV SOLN
2.0000 g | INTRAVENOUS | Status: AC
  Administered 2022-04-03 – 2022-04-06 (×4): 2 g via INTRAVENOUS
  Filled 2022-04-02 (×4): qty 20

## 2022-04-02 MED ORDER — PANTOPRAZOLE 2 MG/ML SUSPENSION
40.0000 mg | Freq: Every day | ORAL | Status: DC
  Administered 2022-04-02: 40 mg
  Filled 2022-04-02: qty 20

## 2022-04-02 MED ORDER — PROSOURCE TF20 ENFIT COMPATIBL EN LIQD: 60.0000 mL | Freq: Every day | ENTERAL | Status: DC

## 2022-04-02 MED ORDER — GABAPENTIN 250 MG/5ML PO SOLN: 100.0000 mg | Freq: Two times a day (BID) | ORAL | Status: DC

## 2022-04-02 MED ORDER — AZITHROMYCIN 250 MG PO TABS: ORAL_TABLET | ORAL | 0 refills | Status: DC

## 2022-04-02 MED ORDER — OSMOLITE 1.5 CAL PO LIQD: 1000.0000 mL | ORAL | 0 refills | Status: DC

## 2022-04-02 MED ORDER — LORAZEPAM 2 MG/ML IJ SOLN
INTRAMUSCULAR | Status: AC
  Administered 2022-04-02: 2 mg
  Filled 2022-04-02: qty 1

## 2022-04-02 MED ORDER — CLOPIDOGREL BISULFATE 75 MG PO TABS: 75.0000 mg | ORAL_TABLET | Freq: Every day | ORAL | Status: DC

## 2022-04-02 MED ORDER — FUROSEMIDE 20 MG PO TABS: 40.0000 mg | ORAL_TABLET | Freq: Every day | ORAL | Status: DC

## 2022-04-02 MED ORDER — ORAL CARE MOUTH RINSE
15.0000 mL | OROMUCOSAL | Status: DC
  Administered 2022-04-03 – 2022-04-07 (×16): 15 mL via OROMUCOSAL

## 2022-04-02 MED ORDER — IPRATROPIUM-ALBUTEROL 0.5-2.5 (3) MG/3ML IN SOLN
3.0000 mL | Freq: Four times a day (QID) | RESPIRATORY_TRACT | Status: DC
  Administered 2022-04-03 – 2022-04-04 (×5): 3 mL via RESPIRATORY_TRACT
  Filled 2022-04-02 (×6): qty 3

## 2022-04-02 MED ORDER — FUROSEMIDE 10 MG/ML IJ SOLN
40.0000 mg | Freq: Once | INTRAMUSCULAR | Status: AC
  Administered 2022-04-02: 40 mg via INTRAVENOUS
  Filled 2022-04-02: qty 4

## 2022-04-02 MED ORDER — FUROSEMIDE 40 MG PO TABS: 40.0000 mg | ORAL_TABLET | Freq: Every day | ORAL | Status: DC

## 2022-04-02 MED ORDER — CLEVIDIPINE BUTYRATE 0.5 MG/ML IV EMUL
0.0000 mg/h | INTRAVENOUS | Status: DC
  Administered 2022-04-02: 1 mg/h via INTRAVENOUS
  Administered 2022-04-03: 2 mg/h via INTRAVENOUS
  Administered 2022-04-03 – 2022-04-04 (×6): 4 mg/h via INTRAVENOUS
  Administered 2022-04-04 (×2): 6 mg/h via INTRAVENOUS
  Administered 2022-04-05: 3 mg/h via INTRAVENOUS
  Filled 2022-04-02 (×10): qty 50

## 2022-04-02 MED ORDER — CITALOPRAM HYDROBROMIDE 20 MG PO TABS
10.0000 mg | ORAL_TABLET | Freq: Every day | ORAL | Status: DC
  Administered 2022-04-02: 10 mg
  Filled 2022-04-02: qty 1

## 2022-04-02 MED ORDER — POTASSIUM CHLORIDE 20 MEQ PO PACK
20.0000 meq | PACK | Freq: Every day | ORAL | Status: DC
  Administered 2022-04-02: 20 meq
  Filled 2022-04-02: qty 1

## 2022-04-02 MED ORDER — METHYLPREDNISOLONE SODIUM SUCC 40 MG IJ SOLR
40.0000 mg | Freq: Every day | INTRAMUSCULAR | Status: DC
  Administered 2022-04-03: 40 mg via INTRAVENOUS
  Filled 2022-04-02: qty 1

## 2022-04-02 MED ORDER — ROSUVASTATIN CALCIUM 10 MG PO TABS: 10.0000 mg | ORAL_TABLET | Freq: Every day | ORAL | Status: DC

## 2022-04-02 MED ORDER — SODIUM CHLORIDE 0.9 % IV SOLN
200.0000 mg | Freq: Two times a day (BID) | INTRAVENOUS | Status: DC
  Administered 2022-04-02 – 2022-04-06 (×9): 200 mg via INTRAVENOUS
  Filled 2022-04-02 (×11): qty 20

## 2022-04-02 MED ORDER — CLEVIDIPINE BUTYRATE 0.5 MG/ML IV EMUL
0.0000 mg/h | INTRAVENOUS | Status: DC
  Administered 2022-04-02: 2 mg/h via INTRAVENOUS
  Administered 2022-04-02: 1 mg/h via INTRAVENOUS
  Filled 2022-04-02 (×3): qty 50

## 2022-04-02 MED ORDER — PANTOPRAZOLE SODIUM 40 MG PO PACK: 40.0000 mg | PACK | Freq: Every day | ORAL | Status: DC

## 2022-04-02 MED ORDER — ADULT MULTIVITAMIN W/MINERALS CH: 1.0000 | ORAL_TABLET | Freq: Every day | ORAL | Status: DC

## 2022-04-02 MED ORDER — ALBUTEROL SULFATE (2.5 MG/3ML) 0.083% IN NEBU: 2.5000 mg | INHALATION_SOLUTION | Freq: Four times a day (QID) | RESPIRATORY_TRACT | Status: DC | PRN

## 2022-04-02 MED ORDER — ORAL CARE MOUTH RINSE: 15.0000 mL | OROMUCOSAL | Status: DC | PRN

## 2022-04-02 MED ORDER — OSMOLITE 1.5 CAL PO LIQD: 1000.0000 mL | ORAL | Status: DC

## 2022-04-02 MED ORDER — FUROSEMIDE 10 MG/ML IJ SOLN
40.0000 mg | Freq: Every day | INTRAMUSCULAR | Status: DC
  Administered 2022-04-03 – 2022-04-04 (×2): 40 mg via INTRAVENOUS
  Filled 2022-04-02 (×2): qty 4

## 2022-04-02 MED ORDER — ADULT MULTIVITAMIN W/MINERALS CH
1.0000 | ORAL_TABLET | Freq: Every day | ORAL | Status: DC
  Filled 2022-04-02: qty 1

## 2022-04-02 MED ORDER — ALBUTEROL SULFATE (2.5 MG/3ML) 0.083% IN NEBU: 2.5000 mg | INHALATION_SOLUTION | Freq: Four times a day (QID) | RESPIRATORY_TRACT | Status: DC

## 2022-04-02 MED ORDER — LOSARTAN POTASSIUM 50 MG PO TABS
100.0000 mg | ORAL_TABLET | Freq: Every day | ORAL | Status: DC
  Administered 2022-04-02: 100 mg
  Filled 2022-04-02: qty 2

## 2022-04-02 MED ORDER — CLEVIDIPINE BUTYRATE 0.5 MG/ML IV EMUL: 0.0000 mg/h | INTRAVENOUS | Status: DC

## 2022-04-02 MED ORDER — HYDRALAZINE HCL 20 MG/ML IJ SOLN
5.0000 mg | INTRAMUSCULAR | Status: DC | PRN
  Administered 2022-04-02: 5 mg via INTRAVENOUS
  Filled 2022-04-02: qty 1

## 2022-04-02 MED ORDER — POTASSIUM CHLORIDE 20 MEQ PO PACK: 20.0000 meq | PACK | Freq: Every day | ORAL | Status: DC

## 2022-04-02 MED ORDER — GADOPICLENOL 0.5 MMOL/ML IV SOLN
7.0000 mL | Freq: Once | INTRAVENOUS | Status: AC | PRN
  Administered 2022-04-02: 7 mL via INTRAVENOUS

## 2022-04-02 MED ORDER — LABETALOL HCL 5 MG/ML IV SOLN: 10.0000 mg | INTRAVENOUS | Status: DC | PRN

## 2022-04-02 MED ORDER — ENOXAPARIN SODIUM 40 MG/0.4ML IJ SOSY
40.0000 mg | PREFILLED_SYRINGE | INTRAMUSCULAR | Status: DC
  Administered 2022-04-02 – 2022-04-06 (×5): 40 mg via SUBCUTANEOUS
  Filled 2022-04-02 (×5): qty 0.4

## 2022-04-02 MED ORDER — IPRATROPIUM-ALBUTEROL 0.5-2.5 (3) MG/3ML IN SOLN
3.0000 mL | Freq: Four times a day (QID) | RESPIRATORY_TRACT | Status: DC
  Administered 2022-04-02 (×3): 3 mL via RESPIRATORY_TRACT
  Filled 2022-04-02 (×4): qty 3

## 2022-04-02 MED ORDER — SPIRONOLACTONE 25 MG PO TABS
25.0000 mg | ORAL_TABLET | Freq: Every day | ORAL | Status: DC
  Administered 2022-04-02: 25 mg
  Filled 2022-04-02: qty 1

## 2022-04-02 NOTE — Discharge Summary (Addendum)
Physician Discharge Summary   Patient: [name] MRN: [id number] DOB: February 24, 1951  Admit date:     03/30/2022  Discharge date: 04/02/22  Discharge Physician: Loletha Grayer   PCP: [facility]   Recommendations at discharge:   Follow-up at [facility] for continuous EEG monitoring  Discharge Diagnoses: Principal Problem:   Acute metabolic encephalopathy Active Problems:   Seizure disorder (HCC)   Respiratory failure, acute-on-chronic (HCC)   COPD exacerbation (HCC)   Chronic diastolic heart failure (HCC)   Essential hypertension   Diabetes mellitus without complication (HCC)   Hypokalemia   Tobacco use   GERD (gastroesophageal reflux disease)   Neuropathy   Cirrhosis, non-alcoholic [facility])    Hospital Course: [name] is a 71 y.o. female with past medical history of heart failure, COPD, diabetes, seizures, cirrhosis, here with shortness of breath, gradually worsening over the past couple of week to the point that it really get worse yesterday and she was not being able to speak in full sentences despite using home oxygen all the time, at baseline she was mostly using at night and occasionally with ambulation.  Patient was little somnolent after getting Ativan for seizure during admission.  Most of the history was obtained from son on phone.  Per son patient was also having worsening cough with some sputum production, no fever or chills. Some decrease in appetite for the past couple of weeks but she was able to eat dinner with the family.  No chest pain.  No urinary symptoms.  ED course.  Patient has hemodynamically stable on arrival.  CBC and CMP without any significant abnormality.  Troponin negative.  BNP at 603.5. COVID and influenza PCR negative. Chest x-ray with concern of mild congestion. CT head with no acute abnormalities.  Patient developed a witnessed seizure while in the ED during that time her blood pressure increased above 470  systolic.  She was given a Keppra load and Ativan.  Apparently missed her morning dose of Vimpat.  She was also given Solu-Medrol and DuoNeb. Placed on BiPAP for concern of hypercarbia and decreased mental status with seizure.  Able to wean her back to 4 L of oxygen.  She was admitted for acute on chronic respiratory failure most likely secondary to COPD exacerbation.  9/25: Patient continued to have seizures despite Keppra load in ED and restarting home Vimpat.  Neurology was consulted and they ordered Vimpat load. Had 3 seizures since this morning requiring Ativan. Patient was somnolent when seen today during morning rounds as she received second dose of Ativan recently. UA positive for nitrites and bacteria.  No leukocytes-added culture.  Holding antibiotics for now.  9/26: Patient continued to have dozing on and off episodes where occasionally noted some shaking.  She all of a sudden stopped responding.  Received a dose of Ativan.  Because of multiple consecutive episodes there was a concern of status epilepticus versus continuation of underlying frequent nonclinical seizures, neurology increase the dose of Keppra and Vimpat and she was reloaded with Keppra. They are recommending transferring to [facility] for continuous EEG as EEG done yesterday did not recorded any seizure activity.  9/27.  Patient had intermittent altered mental status.  Possibility of intermittent seizures.  Did not eat.  Unable to take oral medications.  Dietary recommended NG tube placement which was placed and chest x-ray ordered to confirm placement.  Foley placed for inability to urinate.  Patient wheezing and prednisone changed over to Solu-Medrol.  Blood pressure  elevated.  Hopefully can give oral medications via NG tube once NG tube confirmed in place.  As needed IV hydralazine.  Transferred to [facility] for continuous EEG monitoring.  Assessment and Plan: * Acute metabolic encephalopathy Patient with intermittent  acute metabolic encephalopathy.  This morning was able to talk to me and follow commands this afternoon little less responsive but able to open eyes.  Possibility of status epilepticus.  Transferred to [facility] when bed available for continuous EEG monitoring  Seizure disorder [facility]) Neurology recommending transfer over to [facility] for continuous EEG monitoring to rule out status epilepticus.  Patient on Keppra and Vimpat.  Respiratory failure, acute-on-chronic ([place]) Patient with respiratory distress on presentation requiring BiPAP. Patient with history of COPD and uses 2 L of oxygen most of the time at baseline.  Continues to smoke. Initially visiting on arrival to ED, ABG with hypercarbia and significant shortness of breath so she was placed on BiPAP.  She received Solu-Medrol and breathing treatment along with one-time dose of IV Lasix for concern of flash pulmonary edema. -Continue supplemental oxygen -Prednisone switch back to Solu-Medrol -Zithromax for 5 days for COPD exacerbation -Continue with supplemental oxygen -Bronchodilators -Supportive care  COPD exacerbation (HCC) Prednisone switch back to Solu-Medrol  Chronic diastolic heart failure ([place]) Patient developed flash pulmonary edema most likely with significantly elevated blood pressure with seizure. Received one-time dose of IV Lasix. Chest clear on exam.  Troponin negative. Echocardiogram done and March 2023 with grade 1 diastolic dysfunction and mild to moderate aortic stenosis. BNP elevated at 603. -I switched Lasix to IV for today until NG tube confirmed in place.  Give spironolactone and Cozaar once NG tube in place  Essential hypertension Blood pressure high today.  As needed IV hydralazine.  NG tube but then give spironolactone and Cozaar.  Lasix given IV today but switch back to NG tube for tomorrow    Diabetes mellitus without complication (HCC) CBG currently within goal.  Uses glipizide at  home. -Hemoglobin A1c 6.2 -Continue sliding scale insulin  Hypokalemia Resolved.  Recheck labs today.  Tobacco use -Nicotine patch  GERD (gastroesophageal reflux disease) - Continue home Protonix  Neuropathy - Continue home gabapentin at 100 mg twice daily  Cirrhosis, non-alcoholic ([place]) Appears well compensated. -Continue to monitor -Check ammonia level today         Consultants: Neurology Procedures performed: EEG Disposition: [facility] Diet recommendation:  We will make n.p.o. for right now DISCHARGE MEDICATION: Allergies as of 04/02/2022       Reactions   Aspirin Itching   Celebrex [celecoxib] Itching   itching   Ciprofloxacin Itching   Codeine Itching   Fosphenytoin Itching   Levaquin [levofloxacin In D5w] Itching   Levofloxacin Itching   Lovastatin Itching   Penicillins    Documentation indicates severe reaction Pt tolerated cephalosporin without adverse reaction 09/18   Pravastatin Itching   Sulfa Antibiotics Itching        Medication List     STOP taking these medications    Flonase Allergy Relief 50 MCG/ACT nasal spray Generic drug: fluticasone   gabapentin 100 MG capsule Commonly known as: NEURONTIN Replaced by: gabapentin 250 MG/5ML solution   MAGOX 400 PO   potassium chloride 10 MEQ tablet Commonly known as: KLOR-CON Replaced by: potassium chloride 20 MEQ packet   predniSONE 10 MG tablet Commonly known as: DELTASONE   rOPINIRole 0.5 MG tablet Commonly known as: REQUIP       TAKE these medications  acetaminophen 325 MG tablet Commonly known as: TYLENOL Take 2 tablets (650 mg total) by mouth every 6 (six) hours as needed for mild pain (or Fever >/= 101).   albuterol 108 (90 Base) MCG/ACT inhaler Commonly known as: VENTOLIN HFA Inhale 2 puffs into the lungs every 4 (four) hours as needed for wheezing or shortness of breath.   azithromycin 250 MG tablet Commonly known as: ZITHROMAX One tab ngt daily for  two more days Start taking on: April 03, 2022   citalopram 10 MG tablet Commonly known as: CELEXA Place 1 tablet (10 mg total) into feeding tube daily. What changed: how to take this   clopidogrel 75 MG tablet Commonly known as: PLAVIX Place 1 tablet (75 mg total) into feeding tube daily. What changed: how to take this   feeding supplement (PROSource TF20) liquid Place 60 mLs into feeding tube daily. Start taking on: April 03, 2022   feeding supplement Liqd Take 237 mLs by mouth 3 (three) times daily between meals.   feeding supplement (OSMOLITE 1.5 CAL) Liqd Place 1,000 mLs into feeding tube continuous.   free water Soln Place 30 mLs into feeding tube every 4 (four) hours.   furosemide 40 MG tablet Commonly known as: LASIX Place 1 tablet (40 mg total) into feeding tube daily. Start taking on: April 03, 2022 What changed: how to take this   gabapentin 250 MG/5ML solution Commonly known as: NEURONTIN Place 2 mLs (100 mg total) into feeding tube every 12 (twelve) hours. Replaces: gabapentin 100 MG capsule   glimepiride 2 MG tablet Commonly known as: AMARYL Take 2 mg by mouth daily.   guaiFENesin 600 MG 12 hr tablet Commonly known as: Mucinex Take 1 tablet (600 mg total) by mouth 2 (two) times daily.   hydrALAZINE 20 MG/ML injection Commonly known as: APRESOLINE Inject 0.25 mLs (5 mg total) into the vein every 4 (four) hours as needed (sbp greater than 180).   insulin aspart 100 UNIT/ML injection Commonly known as: novoLOG Inject 0-15 Units into the skin 3 (three) times daily with meals.   insulin aspart 100 UNIT/ML injection Commonly known as: novoLOG Inject 0-5 Units into the skin at bedtime.   ipratropium-albuterol 0.5-2.5 (3) MG/3ML Soln Commonly known as: DUONEB Take 3 mLs by nebulization in the morning, at noon, and at bedtime for 5 days.   lacosamide 200 MG Tabs tablet Commonly known as: VIMPAT Place 1 tablet (200 mg total) into feeding  tube 2 (two) times daily. What changed:  medication strength how much to take how to take this   levETIRAcetam 1000 MG/100ML Soln Commonly known as: KEPPRA Inject 100 mLs (1,000 mg total) into the vein every 12 (twelve) hours.   losartan 100 MG tablet Commonly known as: COZAAR Place 1 tablet (100 mg total) into feeding tube daily. What changed: how to take this   methylPREDNISolone sodium succinate 40 mg/mL injection Commonly known as: SOLU-MEDROL Inject 1 mL (40 mg total) into the vein daily.   metoprolol succinate 25 MG 24 hr tablet Commonly known as: TOPROL-XL Take 1 tablet (25 mg total) by mouth daily. Hold for bradycardia What changed: additional instructions   montelukast 10 MG tablet Commonly known as: SINGULAIR Place 1 tablet (10 mg total) into feeding tube at bedtime. What changed: how to take this   multivitamin with minerals Tabs tablet Place 1 tablet into feeding tube daily.   pantoprazole 40 MG tablet Commonly known as: PROTONIX Take 40 mg by mouth daily. What changed: Another medication  with the same name was added. Make sure you understand how and when to take each.   pantoprazole sodium 40 mg Commonly known as: PROTONIX Place 40 mg into feeding tube daily. What changed: You were already taking a medication with the same name, and this prescription was added. Make sure you understand how and when to take each.   potassium chloride 20 MEQ packet Commonly known as: KLOR-CON Place 20 mEq into feeding tube daily. Replaces: potassium chloride 10 MEQ tablet   rosuvastatin 10 MG tablet Commonly known as: CRESTOR Place 1 tablet (10 mg total) into feeding tube at bedtime. What changed: how to take this   Spiriva Respimat 2.5 MCG/ACT Aers Generic drug: Tiotropium Bromide Monohydrate Inhale 2 puff as directed once a day   spironolactone 25 MG tablet Commonly known as: ALDACTONE Take 25 mg by mouth daily. What changed: Another medication with the same  name was added. Make sure you understand how and when to take each.   spironolactone 25 MG tablet Commonly known as: ALDACTONE Place 1 tablet (25 mg total) into feeding tube daily. What changed: You were already taking a medication with the same name, and this prescription was added. Make sure you understand how and when to take each.        Discharge Exam: Filed Weights   03/30/22 0722 03/30/22 1519 03/31/22 1535  Weight: 74 kg 77.2 kg 78.1 kg   Physical Exam HENT:     Head: Normocephalic.     Mouth/Throat:     Pharynx: No oropharyngeal exudate.  Eyes:     General: Lids are normal.     Conjunctiva/sclera: Conjunctivae normal.  Cardiovascular:     Rate and Rhythm: Normal rate and regular rhythm.     Heart sounds: Normal heart sounds, S1 normal and S2 normal.  Pulmonary:     Breath sounds: Examination of the right-middle field reveals wheezing. Examination of the left-middle field reveals wheezing. Examination of the right-lower field reveals decreased breath sounds and wheezing. Examination of the left-lower field reveals decreased breath sounds and wheezing. Decreased breath sounds and wheezing present. No rhonchi or rales.  Abdominal:     Palpations: Abdomen is soft.     Tenderness: There is no abdominal tenderness.  Musculoskeletal:     Right lower leg: No swelling.     Left lower leg: No swelling.  Skin:    General: Skin is warm.     Findings: No rash.  Neurological:     Comments: This morning was able to follow commands and answer questions appropriately.  Early afternoon was opening eyes but a little more dazed.  This morning the nursing staff stated that she was unable to take oral meds      Condition at discharge: fair  The results of significant diagnostics from this hospitalization (including imaging, microbiology, ancillary and laboratory) are listed below for reference.   Imaging Studies: DG Abd Portable 1V  Result Date: 04/02/2022 CLINICAL DATA:  Feeding  tube placement EXAM: PORTABLE ABDOMEN - 1 VIEW COMPARISON:  04/28/2021 FINDINGS: NG tube enters the stomach with the tip in the body the stomach. Normal bowel gas pattern. Surgical clips right upper quadrant IMPRESSION: NG tip body of stomach.  Normal bowel gas pattern Electronically Signed   By: Franchot Gallo M.D.   On: 04/02/2022 12:50   DG Chest Port 1 View  Result Date: 04/02/2022 CLINICAL DATA:  Wheezing, short of breath EXAM: PORTABLE CHEST 1 VIEW COMPARISON:  03/31/2022 FINDINGS: Normal mediastinum and  cardiac silhouette. Normal pulmonary vasculature. No evidence of effusion, infiltrate, or pneumothorax. No acute bony abnormality. RIGHT shoulder arthroplasty IMPRESSION: No acute cardiopulmonary process. Electronically Signed   By: Suzy Bouchard M.D.   On: 04/02/2022 08:38   EEG adult  Result Date: 03/31/2022 Derek Jack, MD     03/31/2022  7:26 PM Routine EEG Report JAMIRA BARFUSS is a 71 y.o. female with a history of seizures who is undergoing an EEG to evaluate for seizures. Report: This EEG was acquired with electrodes placed according to the International 10-20 electrode system (including Fp1, Fp2, F3, F4, C3, C4, P3, P4, O1, O2, T3, T4, T5, T6, A1, A2, Fz, Cz, Pz). The following electrodes were missing or displaced: none. The occipital dominant rhythm was 6 Hz with intermittent more pronounced diffuse slowing. This activity is reactive to stimulation. Drowsiness was manifested by background fragmentation; deeper stages of sleep were not identified. There was no focal slowing. There were no interictal epileptiform discharges. There were no electrographic seizures identified. Photic stimulation and hyperventilation were not performed. Impression and clinical correlation: This EEG was obtained while awake and drowsy and is abnormal due to mild-to-moderate diffuse slowing indicative of global cerebral dysfunction. Epileptiform abnormalities were not seen during this recording. Su Monks,  MD [facility] [phone] If 7pm- 7am, please page neurology on call as listed in [place].   DG Chest Port 1 View  Result Date: 03/31/2022 CLINICAL DATA:  Increased shortness of breath EXAM: PORTABLE CHEST 1 VIEW COMPARISON:  03/30/2022 FINDINGS: Heart is upper limits normal in size. Mediastinal contours within normal limits. Aortic atherosclerosis. No confluent opacities or effusions. No acute bony abnormality. Prior right shoulder replacement. IMPRESSION: No active disease. Electronically Signed   By: Rolm Baptise M.D.   On: 03/31/2022 18:19   CT HEAD WO CONTRAST (5MM)  Result Date: 03/30/2022 CLINICAL DATA:  Worsening headache. EXAM: CT HEAD WITHOUT CONTRAST TECHNIQUE: Contiguous axial images were obtained from the base of the skull through the vertex without intravenous contrast. RADIATION DOSE REDUCTION: This exam was performed according to the departmental dose-optimization program which includes automated exposure control, adjustment of the mA and/or kV according to patient size and/or use of iterative reconstruction technique. COMPARISON:  April 28, 2021 FINDINGS: Brain: No evidence of acute infarction, hemorrhage, hydrocephalus, extra-axial collection or mass lesion/mass effect. Vascular: No hyperdense vessel or unexpected calcification. Skull: Normal. Negative for fracture or focal lesion. Sinuses/Orbits: No acute finding. Other: None. IMPRESSION: No acute abnormalities.  No cause for headache identified. Electronically Signed   By: Dorise Bullion III M.D.   On: 03/30/2022 13:03   DG Chest 2 View  Result Date: 03/30/2022 CLINICAL DATA:  71 year old female with history of cough, shortness of breath and history of COPD. EXAM: CHEST - 2 VIEW COMPARISON:  Chest x-ray 12/03/2021. FINDINGS: There is cephalization of the pulmonary vasculature and slight indistinctness of the interstitial markings suggestive of mild pulmonary edema. No definite pleural effusions. No pneumothorax. Heart  size is mildly enlarged. Upper mediastinal contours are within normal limits. Atherosclerotic calcifications are noted in the thoracic aorta. Status post right shoulder arthroplasty. IMPRESSION: 1. The appearance the chest is most suggestive of mild congestive heart failure, as above. 2. Aortic atherosclerosis. Electronically Signed   By: Vinnie Langton M.D.   On: 03/30/2022 07:56    Microbiology: Results for orders placed or performed during the hospital encounter of 03/30/22  SARS Coronavirus 2 by RT PCR (hospital order, performed in [facility] hospital lab) *cepheid single result  test* Anterior Nasal Swab     Status: None   Collection Time: 03/30/22  7:21 AM   Specimen: Anterior Nasal Swab  Result Value Ref Range Status   SARS Coronavirus 2 by RT PCR NEGATIVE NEGATIVE Final    Comment: (NOTE) SARS-CoV-2 target nucleic acids are NOT DETECTED.  The SARS-CoV-2 RNA is generally detectable in upper and lower respiratory specimens during the acute phase of infection. The lowest concentration of SARS-CoV-2 viral copies this assay can detect is 250 copies / mL. A negative result does not preclude SARS-CoV-2 infection and should not be used as the sole basis for treatment or other patient management decisions.  A negative result may occur with improper specimen collection / handling, submission of specimen other than nasopharyngeal swab, presence of viral mutation(s) within the areas targeted by this assay, and inadequate number of viral copies (<250 copies / mL). A negative result must be combined with clinical observations, patient history, and epidemiological information.  Fact Sheet for Patients:   https://www.patel.info/  Fact Sheet for Healthcare Providers: https://hall.com/  This test is not yet approved or  cleared by the [place] FDA and has been authorized for detection and/or diagnosis of SARS-CoV-2 by FDA under an Emergency Use  Authorization (EUA).  This EUA will remain in effect (meaning this test can be used) for the duration of the COVID-19 declaration under Section 564(b)(1) of the Act, 21 U.S.C. section 360bbb-3(b)(1), unless the authorization is terminated or revoked sooner.  Performed at [facility], [address], [place], [place] [address]   Urine Culture     Status: Abnormal (Preliminary result)   Collection Time: 03/31/22  2:00 PM   Specimen: Urine, Clean Catch  Result Value Ref Range Status   Specimen Description   Final    URINE, CLEAN CATCH Performed at [facility], [address] [place], [place] [address]    Special Requests   Final    NONE Performed at [facility], [place], [place] [address]    Culture (A)  Final    >=100,000 COLONIES/mL KLEBSIELLA PNEUMONIAE CULTURE REINCUBATED FOR BETTER GROWTH Performed at [facility], [place] [address] [place], [place] [address]    Report Status PENDING  Incomplete  MRSA Next Gen by PCR, Nasal     Status: None   Collection Time: 03/31/22  3:42 PM   Specimen: Nasal Mucosa; Nasal Swab  Result Value Ref Range Status   MRSA by PCR Next Gen NOT DETECTED NOT DETECTED Final    Comment: (NOTE) The GeneXpert MRSA Assay (FDA approved for NASAL specimens only), is one component of a comprehensive MRSA colonization surveillance program. It is not intended to diagnose MRSA infection nor to guide or monitor treatment for MRSA infections. Test performance is not FDA approved in patients less than 49 years old. Performed at [facility], [address] [place], [place] [address]     Labs: CBC: Recent Labs  Lab 03/30/22 0721 03/30/22 1519 03/31/22 0524  WBC 7.9 6.7 7.2  NEUTROABS 4.9  --   --   HGB 12.2 12.4 12.0  HCT 38.7 38.4 37.7  MCV 84.3 82.9 83.8  PLT 189 173 014   Basic Metabolic Panel: Recent Labs  Lab 03/30/22 0721 03/30/22 1519 03/31/22 0524  NA 143  --  144  K  3.4*  --  3.7  CL 112*  --  109  CO2 25  --  27  GLUCOSE 114*  --  208*  BUN 8  --  17  CREATININE 0.56 0.62 0.66  CALCIUM 8.7*  --  8.8*  MG  --   --  1.9   Liver Function Tests: Recent Labs  Lab 03/30/22 0721  AST 19  ALT 11  ALKPHOS 83  BILITOT 0.8  PROT 6.4*  ALBUMIN 3.4*   CBG: Recent Labs  Lab 04/01/22 1143 04/01/22 1724 04/01/22 2157 04/02/22 0748 04/02/22 1129  GLUCAP 167* 170* 211* 177* 141*    Discharge time spent: greater than 30 minutes.  Signed: Loletha Grayer, MD [facility] 04/02/2022

## 2022-04-02 NOTE — Progress Notes (Signed)
Case discussed with neurology and they will start clevidipine drip to lower blood pressure.  We will hold IV hydralazine, Cozaar and spironolactone.  Neurology also ordering an MRI of the brain, just in case this is PRES.

## 2022-04-02 NOTE — Progress Notes (Signed)
Neurology Plan of Care  Repeat routine EEG for [name] done this evening showed intermittent L LPDs at 1 Hz and one focal electrographic seizure w/ clinical correlate lasting one min. She has bed assignment on 4N and [facility] is sending transport now. Needs cEEG on arrival. MRI did not show e/o PRES.  Su Monks, MD [facility] [phone]  If 7pm- 7am, please page neurology on call as listed in [place].

## 2022-04-02 NOTE — Assessment & Plan Note (Signed)
-  Nicotine patch

## 2022-04-02 NOTE — Assessment & Plan Note (Signed)
-  Continue home Protonix

## 2022-04-02 NOTE — Assessment & Plan Note (Signed)
Prednisone switch back to Solu-Medrol

## 2022-04-02 NOTE — Progress Notes (Signed)
Pt transferred to [employer] 25 _0 . Report called to [place] prior to xfer.

## 2022-04-02 NOTE — Assessment & Plan Note (Signed)
-  Continue home gabapentin at 100 mg twice daily

## 2022-04-02 NOTE — Assessment & Plan Note (Signed)
Patient with respiratory distress on presentation requiring BiPAP. Patient with history of COPD and uses 2 L of oxygen most of the time at baseline.  Continues to smoke. Initially visiting on arrival to ED, ABG with hypercarbia and significant shortness of breath so she was placed on BiPAP.  She received Solu-Medrol and breathing treatment along with one-time dose of IV Lasix for concern of flash pulmonary edema. -Continue supplemental oxygen -Prednisone switch back to Solu-Medrol -Zithromax for 5 days for COPD exacerbation -Continue with supplemental oxygen -Bronchodilators -Supportive care

## 2022-04-02 NOTE — Assessment & Plan Note (Signed)
Blood pressure high today.  As needed IV hydralazine.  NG tube but then give spironolactone and Cozaar.  Lasix given IV today but switch back to NG tube for tomorrow

## 2022-04-02 NOTE — Assessment & Plan Note (Addendum)
Neurology recommending transfer over to [facility] for continuous EEG monitoring to rule out status epilepticus.  Patient on Keppra and Vimpat.

## 2022-04-02 NOTE — Progress Notes (Addendum)
Nutrition Follow Up Note   DOCUMENTATION CODES:   Not applicable  INTERVENTION:   Osmolite 1.5_0 /hr- Initiate at 98m/hr and increase by 152mhr q 8 hours until goal rate is reached.   ProSource TF 20- Give 6015maily via tube, each supplement provides 80kcal and 20g of protein.   Free water flushes 15m81m hours to maintain tube patency   Regimen provides 1880kcal/day, 95g/day protein and 1094ml78m of free water  Pt at high refeed risk; recommend monitor potassium, magnesium and phosphorus labs daily until stable  NUTRITION DIAGNOSIS:   Inadequate oral intake related to acute illness as evidenced by meal completion < 25%.  GOAL:   Patient will meet greater than or equal to 90% of their needs -not met   MONITOR:   Labs, Weight trends, TF tolerance, Skin, I & O's  ASSESSMENT:   71 y/7female with h/o deafness, seziures, COPD, CHF, RA, DM, NASH cirrhosis, IBS, CVA, HTN and GERD who is admitted with COPD exacerbation and seizures.  Pt remains too lethargic for oral intake. [facility]. Pt is at high refeed risk. Per chart, pt is up ~9lbs since admission; pt +932ml 49mer I & Os. Lasix initiated. Pt may require advancement of tube post pyloric if she seizures frequently to reduce risk of aspiration.   Medications reviewed and include: azithromycin, celexa, plavix, lovenox, lasix, insulin, solu-medrol, MVI, nicotine, protonix, Kcl, aldactone  Labs reviewed: K 3.7 wnl, Mg 1.9 wnl- 9/25 Cbgs- 141, 177 x 24 hrs  Diet Order:   Diet Order             Diet regular Room service appropriate? Yes; Fluid consistency: Thin  Diet effective now           Diet - low sodium heart healthy                  EDUCATION NEEDS:   Not appropriate for education at this time  Skin:  Skin Assessment: Reviewed RN Assessment (ecchymosis)  Last BM:  9/26- type 2  Height:   Ht Readings from Last 1 Encounters:   03/31/22 _1  (1.6 m)    Weight:   Wt Readings from Last 1 Encounters:  03/31/22 78.1 kg    Ideal Body Weight:  52.2 kg  BMI:  Body mass index is 30.5 kg/m.  Estimated Nutritional Needs:   Kcal:  1700-2000kcal/day  Protein:  85-100g/day  Fluid:  1.4-1.6L/day  Malynn Lucy Koleen DistanceD, LDN Please refer to [facility] and/or RD on-call/weekend/after hours pager

## 2022-04-02 NOTE — Consult Note (Signed)
NAME:  [name], MRN:  [id number], DOB:  May 13, 1951, LOS: 2 ADMISSION DATE:  03/30/2022, CONSULTATION DATE:  04/02/2022 REFERRING MD:  Dr. Leslye Peer, CHIEF COMPLAINT:  AMS, Seizures, Hypertension   Brief Pt Description / Synopsis:  71 year old female with a past medical history significant for HFpEF, COPD, seizures admitted with acute metabolic encephalopathy in the setting of breakthrough seizures and Klebsiella pneumonia a UTI, along with acute on chronic hypoxic and hypercapnic respiratory failure due to acute COPD exacerbation and flash pulmonary edema.  Concern for possible non-convulsive seizures vs ? PRES. Neurology recommends transfer to [facility] for continuous EEG.    History of Present Illness:  [name] is a 71 y.o. female with past medical history of heart failure, COPD, diabetes, seizures, cirrhosis who presented to [facility] ED on 03/30/22 with shortness of breath, gradually worsening over the past couple of week to the point that it really get worse yesterday and she was not being able to speak in full sentences despite using home oxygen all the time, at baseline she was mostly using at night and occasionally with ambulation.  Patient is currently altered and no family is available, therefore history is obtained from chart review.   Patient was little somnolent after getting Ativan for seizure during admission.  Most of the history was obtained from son on phone.  Per son patient was also having worsening cough with some sputum production, no fever or chills. Some decrease in appetite for the past couple of weeks but she was able to eat dinner with the family.  No chest pain.  No urinary symptoms.   ED course.  Patient was hemodynamically stable on arrival.  CBC and CMP without any significant abnormality.  Troponin negative.  BNP at 603.5. COVID and influenza PCR negative. Chest x-ray with concern of mild congestion. CT head with no acute abnormalities.   Patient developed a  witnessed seizure while in the ED during that time her blood pressure increased above 269 systolic.  She was given a Keppra load and Ativan.  Apparently missed her morning dose of Vimpat.She was also given Solu-Medrol and DuoNeb. Placed on BiPAP for concern of hypercarbia and decreased mental status with seizure.  Able to wean her back to 4 L of oxygen.   She was admitted by hospitalist for acute on chronic respiratory failure most likely secondary to COPD exacerbation.  Neurology was consulted for breakthrough seizures.  Please see "significant hospital events" section below for full detailed hospital course.  Pertinent  Medical History   Past Medical History:  Diagnosis Date   Asthma    CHF (congestive heart failure) (HCC)    Cirrhosis, non-alcoholic (HCC)    COPD (chronic obstructive pulmonary disease) ([place])    Deaf    Depression    Diabetes mellitus without complication ([place])    GERD (gastroesophageal reflux disease)    Heart murmur    Hepatitis    History of rheumatic fever    History of scarlet fever    Hypertension    IBS (irritable bowel syndrome)    Lymph node disorder    arm   Neuropathy    On home oxygen therapy    hs   Orthopnea    Osteoarthritis    RA (rheumatoid arthritis) (HCC)    RLS (restless legs syndrome)    Seizures (HCC)    Shortness of breath dyspnea    Sleep apnea    Stroke ([place])    tia    Micro  Data:  9/24: SARS-CoV-2 PCR>> negative 9/25: Urine>>KLEBSIELLA PNEUMONIAE  9/25: MRSA PCR>> negative  Antimicrobials:  Azithromycin 9/25>> Ceftriaxone 9/27>>  Significant Hospital Events: Including procedures, antibiotic start and stop dates in addition to other pertinent events   9/24: Admitted by hospitalist 9/25: Patient continued to have seizures despite Keppra load in ED and restarting home Vimpat.  Neurology was consulted and they ordered Vimpat load. Had 3 seizures since this morning requiring Ativan. Patient was somnolent when seen today  during morning rounds as she received second dose of Ativan recently. UA positive for nitrites and bacteria.  No leukocytes-added culture.  Holding antibiotics for now.  9/26: Patient continued to have dozing on and off episodes where occasionally noted some shaking.  She all of a sudden stopped responding.  Received a dose of Ativan.  Because of multiple consecutive episodes there was a concern of status epilepticus versus continuation of underlying frequent nonclinical seizures, neurology increase the dose of Keppra and Vimpat and she was reloaded with Keppra. They are recommending transferring to [facility] for continuous EEG as EEG done yesterday did not recorded any seizure activity.  9/27.  Patient had intermittent altered mental status.  Possibility of intermittent seizures.  Did not eat.  Unable to take oral medications.  Dietary recommended NG tube placement which was placed and chest x-ray ordered to confirm placement.  Foley placed for inability to urinate.  Patient wheezing and prednisone changed over to Solu-Medrol.  Blood pressure elevated.  Hopefully can give oral medications via NG tube once NG tube confirmed in place.  As needed IV hydralazine.  To transfer to [facility] for continuous EEG monitoring once bed available.  Starting clevidipine drip for hypertension.  PCCM consulted.  Starting ceftriaxone given urine culture with Klebsiella pneumonia (sensitivities still pending).   Interim History / Subjective:  Patient was admitted by hospitalist for altered mental status due to breakthrough seizures along with acute respiratory failure in the setting of COPD exacerbation -Has remained altered, concern for possible nonconvulsive seizures versus PRES  -Patient is to be discharged to [facility] for continuous EEG when bed available per neurology recommendations -NG was placed earlier due to altered mental status and inability to take p.o. medications, however patient's has since removed NG  tube -Systolic blood pressures in the 190s to 200s, being started on clevidipine drip -PCCM asked to consult due to significant hypertension along with altered mental status ~currently protecting airway but remains high risk for intubation  Objective   Blood pressure (!) 198/71, pulse 71, temperature 97.6 F (36.4 C), temperature source Axillary, resp. rate (!) 30, height _0  (1.6 m), weight 78.1 kg, SpO2 94 %.        Intake/Output Summary (Last 24 hours) at 04/02/2022 1416 Last data filed at 04/02/2022 0600 Gross per 24 hour  Intake 666.08 ml  Output 850 ml  Net -183.92 ml   Filed Weights   03/30/22 0722 03/30/22 1519 03/31/22 1535  Weight: 74 kg 77.2 kg 78.1 kg    Examination: General: Acute on chronically ill-appearing female, laying in bed, on 2 L nasal cannula, no acute distress HENT: Atraumatic, normocephalic, neck supple, no JVD Lungs: Coarse breath sounds bilaterally, no wheezing or rales noted, even, nonlabored Cardiovascular:  Abdomen: Soft, nontender, nondistended, no guarding rebound tenderness, bowel sounds positive x4 Extremities: No deformities, no edema, no cyanosis Neuro: Somnolent, patient is deaf but able to read lips, opens eyes and withdraws extremities to pain, unable to follow commands currently, pupils PERRLA GU:  Deferred  Resolved Hospital Problem list     Assessment & Plan:   #Acute Metabolic Encephalopathy in setting of Breakthrough Seizures & UTI #Concern for possible non-convulsive seizures vs ? PRES CT Head negative for acute intracranial abnormality on admission UDS + for Benzo's, Ammonia normal at 19 Routine EEG 9/25 showed no seizures but no spells were captured during the recording.  -Treatment of seizures/metabolic derangements/UTI as outlined below -Provide supportive care -Seizure and aspiration precautions -PRN benzo's for breakthrough seizures (Neuro requests to only give ativan for seizure lasting >5 min or for event with unstable  vital signs. 2/2 recurrent ativan administration patient is becoming increasingly lethargic) -Neurology following, appreciate input -Keppra and Vimpat as per Neurology -MRI Brain pending -To be transferred to [facility] once bed available for continuous EEG  #Hypertensive Urgency/Emergency SBP 190's-200's #Acute on Chronic HFpEF Echocardiogram 09/30/21: LVEF 55-60%, indeterminate diastolic parameters, RV function normal, mild to moderate aortic stenosis -Continuous cardiac monitoring -Clevidipine gtt for GOAL SBP <180 in 1st hr, then <160 over next 23 hrs -PRN Hydralazine -Diuresis as BP and renal function permits ~ currently on 40 mg Lasix daily -Consider repeat Echocardiogram  #Klebsiella Pneumoniae UTI -Monitor fever curve -Trend WBC's & Procalcitonin -Follow cultures as above -Continue empiric Ceftriaxone pending cultures & sensitivities  #Acute on Chronic Hypoxic & Hypercapnic Respiratory Failure in setting of AECOPD & Flash Pulmonary Edema with significantly elevated BP -Supplemental O2 as needed to maintain O2 sats 88 to 92% -BiPAP if needed -Currently protecting her airway, but high risk for intubation -Follow intermittent Chest X-ray & ABG as needed -Bronchodilators  -IV Steroids -ABX as above -Diuresis as BP and renal function permits -Pulmonary toilet as able      Best Practice (right click and "Reselect all SmartList Selections" daily)   Diet/type: NPO DVT prophylaxis: LMWH GI prophylaxis: PPI Lines: N/A Foley:  N/A Code Status:  full code Last date of multidisciplinary goals of care discussion [N/A]  Labs   CBC: Recent Labs  Lab 03/30/22 0721 03/30/22 1519 03/31/22 0524 04/02/22 1309  WBC 7.9 6.7 7.2 8.9  NEUTROABS 4.9  --   --   --   HGB 12.2 12.4 12.0 14.3  HCT 38.7 38.4 37.7 45.0  MCV 84.3 82.9 83.8 83.3  PLT 189 173 172 034    Basic Metabolic Panel: Recent Labs  Lab 03/30/22 0721 03/30/22 1519 03/31/22 0524 04/02/22 1309  NA 143   --  144 143  K 3.4*  --  3.7 3.5  CL 112*  --  109 102  CO2 25  --  27 31  GLUCOSE 114*  --  208* 149*  BUN 8  --  17 21  CREATININE 0.56 0.62 0.66 0.65  CALCIUM 8.7*  --  8.8* 9.3  MG  --   --  1.9  --    GFR: Estimated Creatinine Clearance: 64.8 mL/min (by C-G formula based on SCr of 0.65 mg/dL). Recent Labs  Lab 03/30/22 0721 03/30/22 1519 03/31/22 0524 04/02/22 1309  WBC 7.9 6.7 7.2 8.9  LATICACIDVEN 1.0  --   --   --     Liver Function Tests: Recent Labs  Lab 03/30/22 0721 04/02/22 1309  AST 19 21  ALT 11 14  ALKPHOS 83 79  BILITOT 0.8 0.8  PROT 6.4* 7.6  ALBUMIN 3.4* 3.8   No results for input(s): "LIPASE", "AMYLASE" in the last 168 hours. Recent Labs  Lab 04/02/22 1309  AMMONIA 19    ABG  Component Value Date/Time   PHART 7.43 03/31/2022 2258   PCO2ART 46 03/31/2022 2258   PO2ART 70 (L) 03/31/2022 2258   HCO3 30.5 (H) 03/31/2022 2258   TCO2 27 12/21/2019 0738   ACIDBASEDEF 0.7 03/30/2022 0906   O2SAT 96.5 03/31/2022 2258     Coagulation Profile: No results for input(s): "INR", "PROTIME" in the last 168 hours.  Cardiac Enzymes: No results for input(s): "CKTOTAL", "CKMB", "CKMBINDEX", "TROPONINI" in the last 168 hours.  HbA1C: Hgb A1c MFr Bld  Date/Time Value Ref Range Status  12/04/2021 03:07 AM 6.2 (H) 4.8 - 5.6 % Final    Comment:    (NOTE) Pre diabetes:          5.7%-6.4%  Diabetes:              >6.4%  Glycemic control for   <7.0% adults with diabetes   04/28/2021 10:01 AM 7.2 (H) 4.8 - 5.6 % Final    Comment:    (NOTE)         Prediabetes: 5.7 - 6.4         Diabetes: >6.4         Glycemic control for adults with diabetes: <7.0     CBG: Recent Labs  Lab 04/01/22 1143 04/01/22 1724 04/01/22 2157 04/02/22 0748 04/02/22 1129  GLUCAP 167* 170* 211* 177* 141*    Review of Systems:   Unable to assess due to AMS   Past Medical History:  She,  has a past medical history of Asthma, CHF (congestive heart failure) ([place]),  Cirrhosis, non-alcoholic ([place]), COPD (chronic obstructive pulmonary disease) ([place]), Deaf, Depression, Diabetes mellitus without complication ([place]), GERD (gastroesophageal reflux disease), Heart murmur, Hepatitis, History of rheumatic fever, History of scarlet fever, Hypertension, IBS (irritable bowel syndrome), Lymph node disorder, Neuropathy, On home oxygen therapy, Orthopnea, Osteoarthritis, RA (rheumatoid arthritis) ([place]), RLS (restless legs syndrome), Seizures ([place]), Shortness of breath dyspnea, Sleep apnea, and Stroke ([place]).   Surgical History:   Past Surgical History:  Procedure Laterality Date   ABDOMINAL HYSTERECTOMY     BREAST BIOPSY Right 10/30/2020   Stereo Bx, X-clip,  BENIGN BREAST TISSUE WITH FIBROADENOMATOUS   CATARACT EXTRACTION W/PHACO Right 11/23/2014   Procedure: CATARACT EXTRACTION PHACO AND INTRAOCULAR LENS PLACEMENT ([place]);  Surgeon: Lyla Glassing, MD;  Location: [facility] ORS;  Service: Ophthalmology;  Laterality: Right;   CATARACT EXTRACTION W/PHACO Left 12/14/2014   Procedure: CATARACT EXTRACTION PHACO AND INTRAOCULAR LENS PLACEMENT (I[facility]);  Surgeon: Lyla Glassing, MD;  Location: [facility] ORS;  Service: Ophthalmology;  Laterality: Left;  US:01:16.6 AP:15.8 CDE:12.14   CESAREAN SECTION     CHOLECYSTECTOMY     COLONOSCOPY WITH PROPOFOL N/A 10/08/2020   Procedure: COLONOSCOPY WITH PROPOFOL;  Surgeon: Toledo, Benay Pike, MD;  Location: [facility] ENDOSCOPY;  Service: Gastroenterology;  Laterality: N/A;   ESOPHAGOGASTRODUODENOSCOPY (EGD) WITH PROPOFOL N/A 10/08/2020   Procedure: ESOPHAGOGASTRODUODENOSCOPY (EGD) WITH PROPOFOL;  Surgeon: Toledo, Benay Pike, MD;  Location: [facility] ENDOSCOPY;  Service: Gastroenterology;  Laterality: N/A;  DM DEAF, NEEDS SIGN INTERPRETER PER SON   REVERSE SHOULDER ARTHROPLASTY Right 12/21/2019   Procedure: REVERSE SHOULDER ARTHROPLASTY;  Surgeon: Lovell Sheehan, MD;  Location: [facility] ORS;  Service: Orthopedics;  Laterality: Right;   THUMB ARTHROSCOPY      TONSILLECTOMY     TYMPANOPLASTY     muliple     Social History:   reports that she has been smoking cigarettes. She started smoking about 54 years ago. She has a 26.00 pack-year smoking history. She has never used smokeless  tobacco. She reports that she does not drink alcohol and does not use drugs.   Family History:  Her family history includes Breast cancer in her sister; CAD in her father; Lung cancer in her mother.   Allergies Allergies  Allergen Reactions   Aspirin Itching   Celebrex [Celecoxib] Itching    itching   Ciprofloxacin Itching   Codeine Itching   Fosphenytoin Itching   Levaquin [Levofloxacin In D5w] Itching   Levofloxacin Itching   Lovastatin Itching   Penicillins     Documentation indicates severe reaction  Pt tolerated cephalosporin without adverse reaction 09/18    Pravastatin Itching   Sulfa Antibiotics Itching     Home Medications  Prior to Admission medications   Medication Sig Start Date End Date Taking? Authorizing Provider  citalopram (CELEXA) 10 MG tablet Take 10 mg by mouth daily.   Yes [provider]  clopidogrel (PLAVIX) 75 MG tablet Take 1 tablet (75 mg total) by mouth daily. 07/17/16  Yes Vaughan Basta, MD  furosemide (LASIX) 40 MG tablet Take 40 mg by mouth daily. 07/22/18  Yes [provider]  gabapentin (NEURONTIN) 100 MG capsule Take 100 mg by mouth 2 (two) times daily. 09/18/21  Yes [provider]  glimepiride (AMARYL) 2 MG tablet Take 2 mg by mouth daily.   Yes [provider]  ipratropium-albuterol (DUONEB) 0.5-2.5 (3) MG/3ML SOLN Take 3 mLs by nebulization in the morning, at noon, and at bedtime for 5 days. 12/04/21 03/30/22 Yes Lavina Hamman, MD  lacosamide 150 MG TABS Take 1 tablet (150 mg total) by mouth 2 (two) times daily. 08/01/20  Yes Hosie Poisson, MD  losartan (COZAAR) 100 MG tablet Take 100 mg by mouth daily.   Yes [provider]  montelukast (SINGULAIR) 10 MG tablet Take  10 mg by mouth at bedtime.  03/18/16  Yes [provider]  pantoprazole (PROTONIX) 40 MG tablet Take 40 mg by mouth daily.   Yes [provider]  potassium chloride (KLOR-CON) 10 MEQ tablet Take 2 tablets (20 mEq total) by mouth daily. 08/08/19 03/30/22 Yes Thornell Mule, MD  rosuvastatin (CRESTOR) 10 MG tablet Take 10 mg by mouth at bedtime. 04/21/17  Yes [provider]  SPIRIVA RESPIMAT 2.5 MCG/ACT AERS Inhale 2 puff as directed once a day 12/04/21  Yes Lavina Hamman, MD  spironolactone (ALDACTONE) 25 MG tablet Take 25 mg by mouth daily. 09/25/21  Yes [provider]  acetaminophen (TYLENOL) 325 MG tablet Take 2 tablets (650 mg total) by mouth every 6 (six) hours as needed for mild pain (or Fever >/= 101). Patient not taking: Reported on 12/07/2019 08/08/19   Thornell Mule, MD  albuterol (PROVENTIL HFA;VENTOLIN HFA) 108 (90 BASE) MCG/ACT inhaler Inhale 2 puffs into the lungs every 4 (four) hours as needed for wheezing or shortness of breath. Patient not taking: Reported on 03/30/2022    [provider]  azithromycin (ZITHROMAX) 250 MG tablet One tab ngt daily for two more days 04/03/22   Loletha Grayer, MD  citalopram (CELEXA) 10 MG tablet Place 1 tablet (10 mg total) into feeding tube daily. 04/02/22   Loletha Grayer, MD  clopidogrel (PLAVIX) 75 MG tablet Place 1 tablet (75 mg total) into feeding tube daily. 04/02/22   Loletha Grayer, MD  feeding supplement (ENSURE ENLIVE / ENSURE PLUS) LIQD Take 237 mLs by mouth 3 (three) times daily between meals. 04/01/22   Lorella Nimrod, MD  FLONASE ALLERGY RELIEF 50 MCG/ACT nasal spray Place 1  spray into both nostrils 2 (two) times daily. 08/29/21   [provider]  furosemide (LASIX) 40 MG tablet Place 1 tablet (40 mg total) into feeding tube daily. 04/03/22   Loletha Grayer, MD  gabapentin (NEURONTIN) 250 MG/5ML solution Place 2 mLs (100 mg total) into feeding tube every 12 (twelve) hours. 04/02/22   Loletha Grayer, MD  guaiFENesin (MUCINEX) 600 MG 12 hr tablet Take 1 tablet (600 mg total) by mouth 2 (two) times daily. 12/04/21 12/04/22  Lavina Hamman, MD  hydrALAZINE (APRESOLINE) 20 MG/ML injection Inject 0.25 mLs (5 mg total) into the vein every 4 (four) hours as needed (sbp greater than 180). 04/02/22   Wieting, Richard, MD  insulin aspart (NOVOLOG) 100 UNIT/ML injection Inject 0-15 Units into the skin 3 (three) times daily with meals. 04/01/22   Lorella Nimrod, MD  insulin aspart (NOVOLOG) 100 UNIT/ML injection Inject 0-5 Units into the skin at bedtime. 04/01/22   Lorella Nimrod, MD  lacosamide (VIMPAT) 200 MG TABS tablet Place 1 tablet (200 mg total) into feeding tube 2 (two) times daily. 04/02/22   Loletha Grayer, MD  levETIRAcetam (KEPPRA) 1000 MG/100ML SOLN Inject 100 mLs (1,000 mg total) into the vein every 12 (twelve) hours. 04/01/22   Lorella Nimrod, MD  losartan (COZAAR) 100 MG tablet Place 1 tablet (100 mg total) into feeding tube daily. 04/02/22   Loletha Grayer, MD  Magnesium Oxide (MAGOX 400 PO) Take 1 tablet by mouth daily. Patient not taking: Reported on 03/30/2022 08/06/21   [provider]  methylPREDNISolone sodium succinate (SOLU-MEDROL) 40 mg/mL injection Inject 1 mL (40 mg total) into the vein daily. 04/02/22   Loletha Grayer, MD  metoprolol succinate (TOPROL-XL) 25 MG 24 hr tablet Take 1 tablet (25 mg total) by mouth daily. Hold for bradycardia 04/01/22   Lorella Nimrod, MD  montelukast (SINGULAIR) 10 MG tablet Place 1 tablet (10 mg total) into feeding tube at bedtime. 04/02/22   Loletha Grayer, MD  Multiple Vitamin (MULTIVITAMIN WITH MINERALS) TABS tablet Place 1 tablet into feeding tube daily. 04/02/22   Loletha Grayer, MD  Nutritional Supplements (FEEDING SUPPLEMENT, OSMOLITE 1.5 CAL,) LIQD Place 1,000 mLs into feeding tube continuous. 04/02/22   Loletha Grayer, MD  pantoprazole sodium (PROTONIX) 40 mg Place 40 mg into feeding tube daily. 04/02/22   Loletha Grayer, MD   potassium chloride (KLOR-CON) 20 MEQ packet Place 20 mEq into feeding tube daily. 04/02/22   Loletha Grayer, MD  predniSONE (DELTASONE) 10 MG tablet Take 43m daily for 3days,Take 373mdaily for 3days,Take 2051maily for 3days,Take 39m52mily for 3days, then stop Patient not taking: Reported on 03/30/2022 12/04/21   PateLavina Hamman  Protein (FEEDING SUPPLEMENT, PROSOURCE TF20,) liquid Place 60 mLs into feeding tube daily. 04/03/22   WietLoletha Grayer  rOPINIRole (REQUIP) 0.5 MG tablet Take 0.5 mg by mouth at bedtime. Patient not taking: Reported on 03/30/2022    [provider]  rosuvastatin (CRESTOR) 10 MG tablet Place 1 tablet (10 mg total) into feeding tube at bedtime. 04/02/22   WietLoletha Grayer  spironolactone (ALDACTONE) 25 MG tablet Place 1 tablet (25 mg total) into feeding tube daily. 04/02/22   WietLoletha Grayer  Water For Irrigation, Sterile (FREE WATER) SOLN Place 30 mLs into feeding tube every 4 (four) hours. 04/02/22   WietLoletha Grayer     Critical care time: 40 minutes     JereDarel HongACNP-BC [facility] Prefer epic messenger for cross cover needs  If after hours, please call E-link

## 2022-04-02 NOTE — Assessment & Plan Note (Signed)
Resolved.  Recheck labs today.

## 2022-04-02 NOTE — H&P (Signed)
NAME:  [name], MRN:  [id number], DOB:  04/27/1951, LOS: 0 ADMISSION DATE:  04/02/2022, CONSULTATION DATE: 04/02/2022 REFERRING MD: Dr. Jaclyn Shaggy, CHIEF COMPLAINT: Seizures  History of Present Illness:  71 year old female with chronic HFpEF, COPD, diabetes, seizure disorder who presented with progressively increasing shortness of breath at [facility] on 9/24, was admitted with acute COPD/acute on chronic HFpEF exacerbation, while in the ED at [facility] patient had witnessed seizures, she was given Ativan and loaded with Keppra, on further review it was noted patient missed Vimpat dose and her blood pressure went up to 920 systolic.  She was admitted under hospitalist service, she continued to have seizures, neurology was consulted, patient was started on Vimpat and Keppra, there was concern for status epilepticus versus PRES, patient was transferred to [facility] for continuous video EEG.  During my evaluation patient does have twitching of both upper eyelids, she is nonverbal at baseline, hard to know other complaints Pertinent  Medical History   Past Medical History:  Diagnosis Date   Asthma    CHF (congestive heart failure) (HCC)    Cirrhosis, non-alcoholic (HCC)    COPD (chronic obstructive pulmonary disease) ([place])    Deaf    Depression    Diabetes mellitus without complication ([place])    GERD (gastroesophageal reflux disease)    Heart murmur    Hepatitis    History of rheumatic fever    History of scarlet fever    Hypertension    IBS (irritable bowel syndrome)    Lymph node disorder    arm   Neuropathy    On home oxygen therapy    hs   Orthopnea    Osteoarthritis    RA (rheumatoid arthritis) (HCC)    RLS (restless legs syndrome)    Seizures (HCC)    Shortness of breath dyspnea    Sleep apnea    Stroke ([place])    [place] Hospital Events: Including procedures, antibiotic start and stop dates in addition to other pertinent events     Interim History /  Subjective:    Objective   Blood pressure (!) 123/54, pulse 67, resp. rate (!) 26, SpO2 93 %.       No intake or output data in the 24 hours ending 04/02/22 2243 There were no vitals filed for this visit.  Examination: Physical exam: General: Acute on chronically ill-appearing female, lying on the bed HEENT: [place]/AT, eyes anicteric.  moist mucus membranes Neuro: Eyes closed, does not open, not following commands, she has bilateral upper eyelid twitching, pupils 3 mm bilateral reactive to light Chest: Bilateral rhonchi, no wheezes or crackles Heart: Regular rate and rhythm, no murmurs or gallops Abdomen: Soft, nontender, nondistended, bowel sounds present Skin: No rash   Resolved Hospital Problem list     Assessment & Plan:  Breakthrough seizure with concern for status epilepticus Acute infectious encephalopathy Hypertensive emergency with concern for PRES Acute on chronic HFpEF with pulmonary edema Acute COPD exacerbation Acute urinary tract infection with Klebsiella pneumonia Diabetes type 2 Hypokalemia  Neurology is following We will continue with Keppra and Vimpat Continue as needed Ativan Patient will have continuous EEG, if that confirms she is in status epilepticus, she may need to be intubated for for suppression Avoid deep sedation Patient blood pressure not well controlled, currently on Cleviprex infusion Continue hydralazine 10 mg every 4 hours as needed Resume oral antihypertensive once oral access has been established Continue IV Lasix 40 mg once daily  Monitor intake and output Continue IV steroid and nebs Continue ceftriaxone and azithromycin Follow-up sensitivity result of urine culture Continue Cipro sliding scale with CBG goal 140-180 Continue aggressive electrolyte supplement   Best Practice (right click and "Reselect all SmartList Selections" daily)   Diet/type: NPO DVT prophylaxis: LMWH GI prophylaxis: N/A Lines: N/A Foley:  N/A Code Status:   full code Last date of multidisciplinary goals of care discussion [pending]  Labs   CBC: Recent Labs  Lab 03/30/22 0721 03/30/22 1519 03/31/22 0524 04/02/22 1309  WBC 7.9 6.7 7.2 8.9  NEUTROABS 4.9  --   --   --   HGB 12.2 12.4 12.0 14.3  HCT 38.7 38.4 37.7 45.0  MCV 84.3 82.9 83.8 83.3  PLT 189 173 172 829    Basic Metabolic Panel: Recent Labs  Lab 03/30/22 0721 03/30/22 1519 03/31/22 0524 04/02/22 1309  NA 143  --  144 143  K 3.4*  --  3.7 3.5  CL 112*  --  109 102  CO2 25  --  27 31  GLUCOSE 114*  --  208* 149*  BUN 8  --  17 21  CREATININE 0.56 0.62 0.66 0.65  CALCIUM 8.7*  --  8.8* 9.3  MG  --   --  1.9  --    GFR: Estimated Creatinine Clearance: 64.8 mL/min (by C-G formula based on SCr of 0.65 mg/dL). Recent Labs  Lab 03/30/22 0721 03/30/22 1519 03/31/22 0524 04/02/22 1309  WBC 7.9 6.7 7.2 8.9  LATICACIDVEN 1.0  --   --   --     Liver Function Tests: Recent Labs  Lab 03/30/22 0721 04/02/22 1309  AST 19 21  ALT 11 14  ALKPHOS 83 79  BILITOT 0.8 0.8  PROT 6.4* 7.6  ALBUMIN 3.4* 3.8   No results for input(s): "LIPASE", "AMYLASE" in the last 168 hours. Recent Labs  Lab 04/02/22 1309  AMMONIA 19    ABG    Component Value Date/Time   PHART 7.54 (H) 04/02/2022 1603   PCO2ART 41 04/02/2022 1603   PO2ART 58 (L) 04/02/2022 1603   HCO3 35.1 (H) 04/02/2022 1603   TCO2 27 12/21/2019 0738   ACIDBASEDEF 0.7 03/30/2022 0906   O2SAT 92.3 04/02/2022 1603     Coagulation Profile: No results for input(s): "INR", "PROTIME" in the last 168 hours.  Cardiac Enzymes: No results for input(s): "CKTOTAL", "CKMB", "CKMBINDEX", "TROPONINI" in the last 168 hours.  HbA1C: Hgb A1c MFr Bld  Date/Time Value Ref Range Status  12/04/2021 03:07 AM 6.2 (H) 4.8 - 5.6 % Final    Comment:    (NOTE) Pre diabetes:          5.7%-6.4%  Diabetes:              >6.4%  Glycemic control for   <7.0% adults with diabetes   04/28/2021 10:01 AM 7.2 (H) 4.8 - 5.6 %  Final    Comment:    (NOTE)         Prediabetes: 5.7 - 6.4         Diabetes: >6.4         Glycemic control for adults with diabetes: <7.0     CBG: Recent Labs  Lab 04/01/22 2157 04/02/22 0748 04/02/22 1129 04/02/22 1742 04/02/22 2058  GLUCAP 211* 177* 141* 194* 172*    Review of Systems:   Unable to obtain due to encephalopathy  Past Medical History:  She,  has a past medical history of Asthma,  CHF (congestive heart failure) ([place]), Cirrhosis, non-alcoholic ([place]), COPD (chronic obstructive pulmonary disease) ([place]), Deaf, Depression, Diabetes mellitus without complication ([place]), GERD (gastroesophageal reflux disease), Heart murmur, Hepatitis, History of rheumatic fever, History of scarlet fever, Hypertension, IBS (irritable bowel syndrome), Lymph node disorder, Neuropathy, On home oxygen therapy, Orthopnea, Osteoarthritis, RA (rheumatoid arthritis) ([place]), RLS (restless legs syndrome), Seizures ([place]), Shortness of breath dyspnea, Sleep apnea, and Stroke ([place]).   Surgical History:   Past Surgical History:  Procedure Laterality Date   ABDOMINAL HYSTERECTOMY     BREAST BIOPSY Right 10/30/2020   Stereo Bx, X-clip,  BENIGN BREAST TISSUE WITH FIBROADENOMATOUS   CATARACT EXTRACTION W/PHACO Right 11/23/2014   Procedure: CATARACT EXTRACTION PHACO AND INTRAOCULAR LENS PLACEMENT ([place]);  Surgeon: Lyla Glassing, MD;  Location: [facility] ORS;  Service: Ophthalmology;  Laterality: Right;   CATARACT EXTRACTION W/PHACO Left 12/14/2014   Procedure: CATARACT EXTRACTION PHACO AND INTRAOCULAR LENS PLACEMENT (IOC);  Surgeon: Lyla Glassing, MD;  Location: [facility] ORS;  Service: Ophthalmology;  Laterality: Left;  US:01:16.6 AP:15.8 CDE:12.14   CESAREAN SECTION     CHOLECYSTECTOMY     COLONOSCOPY WITH PROPOFOL N/A 10/08/2020   Procedure: COLONOSCOPY WITH PROPOFOL;  Surgeon: Toledo, Benay Pike, MD;  Location: [facility] ENDOSCOPY;  Service: Gastroenterology;  Laterality: N/A;   ESOPHAGOGASTRODUODENOSCOPY (EGD)  WITH PROPOFOL N/A 10/08/2020   Procedure: ESOPHAGOGASTRODUODENOSCOPY (EGD) WITH PROPOFOL;  Surgeon: Toledo, Benay Pike, MD;  Location: [facility] ENDOSCOPY;  Service: Gastroenterology;  Laterality: N/A;  DM DEAF, NEEDS SIGN INTERPRETER PER SON   REVERSE SHOULDER ARTHROPLASTY Right 12/21/2019   Procedure: REVERSE SHOULDER ARTHROPLASTY;  Surgeon: Lovell Sheehan, MD;  Location: [facility] ORS;  Service: Orthopedics;  Laterality: Right;   THUMB ARTHROSCOPY     TONSILLECTOMY     TYMPANOPLASTY     muliple     Social History:   reports that she has been smoking cigarettes. She started smoking about 54 years ago. She has a 26.00 pack-year smoking history. She has never used smokeless tobacco. She reports that she does not drink alcohol and does not use drugs.   Family History:  Her family history includes Breast cancer in her sister; CAD in her father; Lung cancer in her mother.   Allergies Allergies  Allergen Reactions   Aspirin Itching   Celebrex [Celecoxib] Itching    itching   Ciprofloxacin Itching   Codeine Itching   Fosphenytoin Itching   Levaquin [Levofloxacin In D5w] Itching   Levofloxacin Itching   Lovastatin Itching   Penicillins     Documentation indicates severe reaction  Pt tolerated cephalosporin without adverse reaction 09/18    Pravastatin Itching   Sulfa Antibiotics Itching     Home Medications  Prior to Admission medications   Medication Sig Start Date End Date Taking? Authorizing Provider  acetaminophen (TYLENOL) 325 MG tablet Take 2 tablets (650 mg total) by mouth every 6 (six) hours as needed for mild pain (or Fever >/= 101). Patient not taking: Reported on 12/07/2019 08/08/19   Thornell Mule, MD  albuterol (PROVENTIL HFA;VENTOLIN HFA) 108 (90 BASE) MCG/ACT inhaler Inhale 2 puffs into the lungs every 4 (four) hours as needed for wheezing or shortness of breath. Patient not taking: Reported on 03/30/2022    [provider]  azithromycin (ZITHROMAX) 250 MG tablet One  tab ngt daily for two more days 04/03/22   Loletha Grayer, MD  cefTRIAXone 1 g in sodium chloride 0.9 % 100 mL Inject 1 g into the vein daily. 04/02/22   Loletha Grayer, MD  citalopram (CELEXA)  10 MG tablet Place 1 tablet (10 mg total) into feeding tube daily. 04/02/22   Loletha Grayer, MD  clevidipine (CLEVIPREX) 0.5 MG/ML EMUL Inject 0-21 mg/hr into the vein continuous. 04/02/22   Loletha Grayer, MD  clopidogrel (PLAVIX) 75 MG tablet Place 1 tablet (75 mg total) into feeding tube daily. 04/02/22   Loletha Grayer, MD  feeding supplement (ENSURE ENLIVE / ENSURE PLUS) LIQD Take 237 mLs by mouth 3 (three) times daily between meals. 04/01/22   Lorella Nimrod, MD  furosemide (LASIX) 40 MG tablet Place 1 tablet (40 mg total) into feeding tube daily. 04/03/22   Loletha Grayer, MD  gabapentin (NEURONTIN) 250 MG/5ML solution Place 2 mLs (100 mg total) into feeding tube every 12 (twelve) hours. 04/02/22   Loletha Grayer, MD  glimepiride (AMARYL) 2 MG tablet Take 2 mg by mouth daily.    [provider]  guaiFENesin (MUCINEX) 600 MG 12 hr tablet Take 1 tablet (600 mg total) by mouth 2 (two) times daily. 12/04/21 12/04/22  Lavina Hamman, MD  hydrALAZINE (APRESOLINE) 20 MG/ML injection Inject 0.25 mLs (5 mg total) into the vein every 4 (four) hours as needed (sbp greater than 180). 04/02/22   Wieting, Richard, MD  insulin aspart (NOVOLOG) 100 UNIT/ML injection Inject 0-15 Units into the skin 3 (three) times daily with meals. 04/01/22   Lorella Nimrod, MD  insulin aspart (NOVOLOG) 100 UNIT/ML injection Inject 0-5 Units into the skin at bedtime. 04/01/22   Lorella Nimrod, MD  ipratropium-albuterol (DUONEB) 0.5-2.5 (3) MG/3ML SOLN Take 3 mLs by nebulization in the morning, at noon, and at bedtime for 5 days. 12/04/21 03/30/22  Lavina Hamman, MD  lacosamide (VIMPAT) 200 MG TABS tablet Place 1 tablet (200 mg total) into feeding tube 2 (two) times daily. 04/02/22   Loletha Grayer, MD  levETIRAcetam (KEPPRA)  1000 MG/100ML SOLN Inject 100 mLs (1,000 mg total) into the vein every 12 (twelve) hours. 04/01/22   Lorella Nimrod, MD  losartan (COZAAR) 100 MG tablet Place 1 tablet (100 mg total) into feeding tube daily. 04/02/22   Loletha Grayer, MD  methylPREDNISolone sodium succinate (SOLU-MEDROL) 40 mg/mL injection Inject 1 mL (40 mg total) into the vein daily. 04/02/22   Loletha Grayer, MD  metoprolol succinate (TOPROL-XL) 25 MG 24 hr tablet Take 1 tablet (25 mg total) by mouth daily. Hold for bradycardia 04/01/22   Lorella Nimrod, MD  montelukast (SINGULAIR) 10 MG tablet Place 1 tablet (10 mg total) into feeding tube at bedtime. 04/02/22   Loletha Grayer, MD  Multiple Vitamin (MULTIVITAMIN WITH MINERALS) TABS tablet Place 1 tablet into feeding tube daily. 04/02/22   Loletha Grayer, MD  Nutritional Supplements (FEEDING SUPPLEMENT, OSMOLITE 1.5 CAL,) LIQD Place 1,000 mLs into feeding tube continuous. 04/02/22   Loletha Grayer, MD  pantoprazole (PROTONIX) 40 MG tablet Take 40 mg by mouth daily.    [provider]  pantoprazole sodium (PROTONIX) 40 mg Place 40 mg into feeding tube daily. 04/02/22   Loletha Grayer, MD  potassium chloride (KLOR-CON) 20 MEQ packet Place 20 mEq into feeding tube daily. 04/02/22   Loletha Grayer, MD  Protein (FEEDING SUPPLEMENT, PROSOURCE TF20,) liquid Place 60 mLs into feeding tube daily. 04/03/22   Loletha Grayer, MD  rosuvastatin (CRESTOR) 10 MG tablet Place 1 tablet (10 mg total) into feeding tube at bedtime. 04/02/22   Loletha Grayer, MD  SPIRIVA RESPIMAT 2.5 MCG/ACT AERS Inhale 2 puff as directed once a day 12/04/21   Lavina Hamman, MD  spironolactone (ALDACTONE)  25 MG tablet Take 25 mg by mouth daily. 09/25/21   [provider]  spironolactone (ALDACTONE) 25 MG tablet Place 1 tablet (25 mg total) into feeding tube daily. 04/02/22   Loletha Grayer, MD  Water For Irrigation, Sterile (FREE WATER) SOLN Place 30 mLs into feeding tube every 4 (four) hours.  04/02/22   Loletha Grayer, MD     Critical care time:     Total critical care time: 42 minutes  Performed by: [place] care time was exclusive of separately billable procedures and treating other patients.   Critical care was necessary to treat or prevent imminent or life-threatening deterioration.   Critical care was time spent personally by me on the following activities: development of treatment plan with patient and/or surrogate as well as nursing, discussions with consultants, evaluation of patient's response to treatment, examination of patient, obtaining history from patient or surrogate, ordering and performing treatments and interventions, ordering and review of laboratory studies, ordering and review of radiographic studies, pulse oximetry and re-evaluation of patient's condition.   Jacky Kindle, MD [facility] See Amion for pager If no response to pager, please call [phone] until 7pm After 7pm, Please call E[facility]link [phone]

## 2022-04-02 NOTE — Assessment & Plan Note (Signed)
Appears well compensated. -Continue to monitor -Check ammonia level today

## 2022-04-02 NOTE — Progress Notes (Signed)
[facility] Progress Note Patient Name: [name] DOB: Nov 06, 1950 MRN: [id number]   Date of Service  04/02/2022  HPI/Events of Note  69F with deafness requiring ASL, hx seizure, COPD, HTN, CHF, cirrhosis who presents with seizure like activity in setting of UTI and missed vimpat doses. Hospital course with hypertensive emergency and respiratory failure. CT head 9/24 neg AICA Routine EEG 9/27with seizure MRI 9/27 neg for acute intracranial abnormality CXR 9/27 neg for acute abnormalities  Transferred to [facility] ICU for continuous EEG  eICU Interventions  Primary team to contact Neurology. AED management per consult team cEEG HTN - cleviprex gtt for SBP <180 AoC respiratory failure with hypercarbia and hypoxia - nebs, steroids, azithro. May need intubation for airway protection. Will continue monitor if seizures persistent Klebsiella UTI - Continue abx. Ceftriaxone D1 9/27     Intervention Category Evaluation Type: New Patient Evaluation  [place] Virrueta Rodman Pickle 04/02/2022, 10:15 PM

## 2022-04-02 NOTE — Assessment & Plan Note (Signed)
Patient developed flash pulmonary edema most likely with significantly elevated blood pressure with seizure. Received one-time dose of IV Lasix. Chest clear on exam.  Troponin negative. Echocardiogram done and March 2023 with grade 1 diastolic dysfunction and mild to moderate aortic stenosis. BNP elevated at 603. -I switched Lasix to IV for today until NG tube confirmed in place.  Give spironolactone and Cozaar once NG tube in place

## 2022-04-02 NOTE — Assessment & Plan Note (Signed)
Patient with intermittent acute metabolic encephalopathy.  This morning was able to talk to me and follow commands this afternoon little less responsive but able to open eyes.  Possibility of status epilepticus.  Transferred to [facility] when bed available for continuous EEG monitoring

## 2022-04-02 NOTE — Progress Notes (Signed)
Eeg done 

## 2022-04-02 NOTE — Progress Notes (Signed)
Patient intermittently alert for brief period, follows commands. Purposeful movement in all extremities. Patient bladder scanned this morning,intermittent cath done. MD made of aware of patients medications. Patient hypertensive. PRN medications given per MD.   Patient BP still hypertensive. MD made aware, gave PRN medications. Blood pressure unchanged. MD made aware. NG tube placed. Patient complaining of pain when trying to void, foley placed, effective.   Neurology updated on patient status. Suspected patient is having brief intermittent seizure activity.   Patient removed NG tubes. NP made aware. BP IV medication started. Patient to go to MRI. Will reinsert NG after MRI. NP aware.  Patient went to MRI, uneventful. Patient tolerated well.   Patient in room, responsive, follows commands, able to express needs. Patient is still very drowsy with intermittent alertness. Will continue to monitor. Patients son updated at bedside after MRI.

## 2022-04-02 NOTE — Progress Notes (Signed)
NG tube placed down to 55 cm.  Chest x-ray ordered to confirm placement.  Air bubble heard.

## 2022-04-02 NOTE — Progress Notes (Addendum)
CSW spoke with son [name], aware of plan for patient to transfer to [facility] and he reports no questions or concerns at this time. CSW was consulted to speak with patient's daughter Kenney Houseman, she does endorse not having a car and wishes to get updates from staff regarding when patient will be transported to [facility] so she is aware. No further questions or concerns.   [place], [place]

## 2022-04-02 NOTE — Progress Notes (Signed)
Neurology progress note  S/Interval hx: Patient intermittently able to follow commands (answer name and location, stick out tongue, lift any designated extremity) and then at other times she becomes lethargic, unable to follow commands. At times events accompanied by R facial twitching. She was mildly hypertensive at admission which has worsened and had multiple systolic readings >814 today requiring initiation of clevidipine gtt. On ceftriaxone for klebsiella UTI. Required foley today for urinary retention. Unable to take po, NGT placed but patient pulled it out. CCM consulted this afternoon.  ABG 9/27 @ 1600 7.54 / 41 / 58 / 35.1  Ammonia 19  O:  Vitals:   04/02/22 1053 04/02/22 1322  BP: (!) 198/71   Pulse:  71  Resp:  (!) 30  Temp:    SpO2:  94%   Physical Exam Gen: lethargic, oriented to self HEENT: Atraumatic, normocephalic;mucous membranes moist; oropharynx clear, tongue without atrophy or fasciculations. Neck: Supple, trachea midline. Resp: CTAB, no w/r/r CV: RRR, no m/g/r; nml S1 and S2. 2+ symmetric peripheral pulses. Abd: soft/NT/ND; nabs x 4 quad Extrem: Nml bulk; no cyanosis, clubbing, or edema.   Neuro: *MS: lethargic, oriented to self. Intermittently follows simple commands *Speech: mod dysarthria, hypophonic, does not name or repeat *CN: PERRL, blinks to threat bilat, EOMI, face symmetric, patient is deaf (some lip reading) *Motor & sensory: tremulous. Was anti-gravity in all extremities on command for RN earlier this afternoon but does not follow commands in any extremities on my exam. Withdraws to pain x4 extrem, symmetric. 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  She is currently admitted to [facility] for breakthrough seizures in the setting of missed vimpat doses, Klebsiella UTI, and COPD exacerbation. Hospitalization has been complicated by flash pulmonary edema and now hypertensive emergency with worsening acute on chronic hypoxic and hypercapnic respiratory failure. She is currently protecting airway but at high risk for intubation. She has become increasingly hypertensive, up to SBP 200 this afternoon, requiring initiation of clevidipine gtt.   She continues to have frequent spells of facial twitching and fluctuating mentation c/f focal status epilepticus. Between these events she is able to follow commands. Spot EEG Mon showed no sz but no spells captured. Repeat spot eeg pending today. She is now maxed out on vimpat and Keppra (really-dosed). VPA is not an option given her cirrhosis and hx hyperammonemia. Her mental status is too poor to give any further sedating medications without intubating. She is unable to take po and pulled out her NGT this afternoon.   MRI brain has been ordered 2/2 concern for PRES given persistent seizures in the setting of hypertensive emergency.  Transfer request to [facility] for cEEG initiated yesterday but no beds have become available. I called [facility] this afternoon and notified them that she will now require higher level of care, bed in 4N Neuro ICU, they are working on it and expect her to have a bed shortly.   Recommendations - MRI brain wwo - Continue keppra 1051m q 12 hrs (renal dosing) - Continue vimpat 2017m  bid - Patient unable to take po, pulled out NGT this afternoon - Only give ativan for seizure lasting >5 min or for event with unstable vital signs. 2/2 recurrent ativan administration patient is  becoming increasingly lethargic. Notify MD if ativan is given - Pending transfer to [facility] 4N Neuro ICU, awaiting bed assignment. Accepting MD Dr. Ina Homes CCM - CCM consulting at [facility] while awaiting transfer; appreciate their mgmt of respiratory failure, hemodynamic derangements, and UTI - Please page Neurohospitalist at [facility] when patient arrives; pt will need to be hooked up to cEEG - Clevidipine got for hypertensive emergency goal SBP <180 in 1st hr, then <160 over next 23 hrs - Seizure precautions  This patient is critically ill and at significant risk of neurological worsening, death and care requires constant monitoring of vital signs, hemodynamics,respiratory and cardiac monitoring, neurological assessment, discussion with family, other specialists and medical decision making of high complexity. I spent 110 minutes of neurocritical care time  in the care of  this patient. This was time spent independent of any time provided by nurse practitioner or PA.  Su Monks, MD [facility] [phone]  If 7pm- 7am, please page neurology on call as listed in [place].   **Any copied and pasted documentation in this note was written by me in another application not separately billed for and pasted here by me.

## 2022-04-02 NOTE — Assessment & Plan Note (Signed)
CBG currently within goal.  Uses glipizide at home. -Hemoglobin A1c 6.2 -Continue sliding scale insulin

## 2022-04-02 NOTE — Procedures (Signed)
Routine EEG Report  [name] is a 71 y.o. female with a history of seizures who is undergoing an EEG to evaluate for seizures.  Report: This EEG was acquired with electrodes placed according to the International 10-20 electrode system (including Fp1, Fp2, F3, F4, C3, C4, P3, P4, O1, O2, T3, T4, T5, T6, A1, A2, Fz, Cz, Pz). The following electrodes were missing or displaced: none.  The occipital dominant rhythm was 4-6 Hz. This activity is reactive to stimulation. Drowsiness was manifested by background fragmentation; deeper stages of sleep were not identified. There was no focal slowing. There were sharp waves noted over the left hemisphere that at times became periodic at 1 Hz. There was one electrographic seizure at 1807 consisting of sharply contoured rhythmic theta with admixed sharp wave discharges with evolution that lasted approximately one minute. This activity was most prominent over the left hemisphere. Clinically during this event (by video review) she had right facial twitching, fidgeting, and moaning. Photic stimulation and hyperventilation were not performed.   Impression and clinical correlation: This EEG was obtained while awake and drowsy and is abnormal due to: - Moderate-to-severe diffuse slowing - Intermittent lateralized periodic discharges over the left hemisphere at 1 Hz - One electrographic seizure at 1807 manifesting as sharply contoured rhythmic theta with admixed sharp wave discharges with evolution most prominent over the left hemisphere that lasted approximately one minute and resolved without intervention. Clinically during this event she had right facial twitching, fidgeting, and moaning.  Patient will be transferred to 4N neuro ICU at [facility] for cEEG monitoring and further management. Bed has already been assigned.  Su Monks, MD [facility] [phone]  If 7pm- 7am, please page neurology on call as listed in [place].

## 2022-04-03 DIAGNOSIS — G40901 Epilepsy, unspecified, not intractable, with status epilepticus: Secondary | ICD-10-CM

## 2022-04-03 DIAGNOSIS — I5021 Acute systolic (congestive) heart failure: Secondary | ICD-10-CM

## 2022-04-03 LAB — GLUCOSE, CAPILLARY
Glucose-Capillary: 123 mg/dL — ABNORMAL HIGH (ref 70–99)
Glucose-Capillary: 125 mg/dL — ABNORMAL HIGH (ref 70–99)
Glucose-Capillary: 137 mg/dL — ABNORMAL HIGH (ref 70–99)
Glucose-Capillary: 231 mg/dL — ABNORMAL HIGH (ref 70–99)
Glucose-Capillary: 94 mg/dL (ref 70–99)
Glucose-Capillary: 98 mg/dL (ref 70–99)

## 2022-04-03 LAB — CBC
HCT: 43.1 % (ref 36.0–46.0)
Hemoglobin: 13.8 g/dL (ref 12.0–15.0)
MCH: 26.8 pg (ref 26.0–34.0)
MCHC: 32 g/dL (ref 30.0–36.0)
MCV: 83.7 fL (ref 80.0–100.0)
Platelets: 195 10*3/uL (ref 150–400)
RBC: 5.15 MIL/uL — ABNORMAL HIGH (ref 3.87–5.11)
RDW: 13.7 % (ref 11.5–15.5)
WBC: 7.6 10*3/uL (ref 4.0–10.5)
nRBC: 0 % (ref 0.0–0.2)

## 2022-04-03 LAB — URINE CULTURE: Culture: >=100000 — AB

## 2022-04-03 LAB — BASIC METABOLIC PANEL
Anion gap: 13 (ref 5–15)
BUN: 15 mg/dL (ref 8–23)
CO2: 31 mmol/L (ref 22–32)
Calcium: 9.6 mg/dL (ref 8.9–10.3)
Chloride: 100 mmol/L (ref 98–111)
Creatinine, Ser: 0.68 mg/dL (ref 0.44–1.00)
GFR, Estimated: >60 mL/min (ref >60)
Glucose, Bld: 94 mg/dL (ref 70–99)
Potassium: 3.4 mmol/L — ABNORMAL LOW (ref 3.5–5.1)
Sodium: 144 mmol/L (ref 135–145)

## 2022-04-03 LAB — MRSA NEXT GEN BY PCR, NASAL: MRSA by PCR Next Gen: NOT DETECTED

## 2022-04-03 LAB — MAGNESIUM: Magnesium: 2 mg/dL (ref 1.7–2.4)

## 2022-04-03 LAB — PHOSPHORUS: Phosphorus: 2.8 mg/dL (ref 2.5–4.6)

## 2022-04-03 MED ORDER — ACETAMINOPHEN 650 MG RE SUPP
650.0000 mg | Freq: Four times a day (QID) | RECTAL | Status: DC | PRN
  Administered 2022-04-03: 650 mg via RECTAL
  Filled 2022-04-03: qty 1

## 2022-04-03 MED ORDER — METHYLPREDNISOLONE SODIUM SUCC 40 MG IJ SOLR
40.0000 mg | Freq: Every day | INTRAMUSCULAR | Status: DC
  Administered 2022-04-04: 40 mg via INTRAVENOUS
  Filled 2022-04-03 (×2): qty 1

## 2022-04-03 MED ORDER — CHLORHEXIDINE GLUCONATE CLOTH 2 % EX PADS
6.0000 | MEDICATED_PAD | Freq: Every day | CUTANEOUS | Status: DC
  Administered 2022-04-04 – 2022-04-07 (×4): 6 via TOPICAL

## 2022-04-03 MED ORDER — DEXTROSE 5 % IV SOLN
250.0000 mg | Freq: Every day | INTRAVENOUS | Status: AC
  Administered 2022-04-03: 250 mg via INTRAVENOUS
  Filled 2022-04-03: qty 2.5

## 2022-04-03 NOTE — Progress Notes (Signed)
[place] Progress Note Patient Name: [name] DOB: May 10, 1951 MRN: [id number]   Date of Service  04/03/2022  HPI/Events of Note  K 3.4 Completing 3rd run of potassium  eICU Interventions  BMET, Mg tomorrow am     Intervention Category Intermediate Interventions: Electrolyte abnormality - evaluation and management  Abdulla Pooley Rodman Pickle 04/03/2022, 5:39 AM

## 2022-04-03 NOTE — Plan of Care (Signed)
Initial EEG reviewed, no ongoing seizure activity, continue Vimpat and Keppra.    Roland Rack, MD [facility] [phone]  If 7pm- 7am, please page neurology on call as listed in [place].

## 2022-04-03 NOTE — Progress Notes (Cosign Needed)
NAME:  [name], MRN:  [id number], DOB:  1951/01/10, LOS: 1 ADMISSION DATE:  04/02/2022, CONSULTATION DATE: 04/02/2022 REFERRING MD: Dr. Jaclyn Shaggy, CHIEF COMPLAINT: Seizures  History of Present Illness:  71 year old female with chronic HFpEF, COPD, diabetes, seizure disorder who presented with progressively increasing shortness of breath at [facility] on 9/24, was admitted with acute COPD/acute on chronic HFpEF exacerbation, while in the ED at [facility] patient had witnessed seizures, she was given Ativan and loaded with Keppra, on further review it was noted patient missed Vimpat dose and her blood pressure went up to 027 systolic.  She was admitted under hospitalist service, she continued to have seizures, neurology was consulted, patient was started on Vimpat and Keppra, there was concern for status epilepticus versus PRES, patient was transferred to [facility] for continuous video EEG.  She is nonverbal at baseline, hard to know other complaints Pertinent  Medical History   Past Medical History:  Diagnosis Date   Asthma    CHF (congestive heart failure) (HCC)    Cirrhosis, non-alcoholic (HCC)    COPD (chronic obstructive pulmonary disease) ([place])    Deaf    Depression    Diabetes mellitus without complication ([place])    GERD (gastroesophageal reflux disease)    Heart murmur    Hepatitis    History of rheumatic fever    History of scarlet fever    Hypertension    IBS (irritable bowel syndrome)    Lymph node disorder    arm   Neuropathy    On home oxygen therapy    hs   Orthopnea    Osteoarthritis    RA (rheumatoid arthritis) (HCC)    RLS (restless legs syndrome)    Seizures (HCC)    Shortness of breath dyspnea    Sleep apnea    Stroke ([place])    [place] [facility] Events: Including procedures, antibiotic start and stop dates in addition to other pertinent events   - 9/27 - Transferred from [facility] for continuous EEG monitoring, No acute intracranial abnormality.   No findings of PRES.   - 9/28 - Initial EEG reviewed, no ongoing seizure activity, continue Vimpat and Keppra. Interim History / Subjective:   Objective   Blood pressure (!) 134/49, pulse 65, temperature (!) 97.4 F (36.3 C), temperature source Axillary, resp. rate (!) 21, weight 76.6 kg, SpO2 93 %.        Intake/Output Summary (Last 24 hours) at 04/03/2022 0704 Last data filed at 04/03/2022 0600 Gross per 24 hour  Intake 611.96 ml  Output 305 ml  Net 306.96 ml   Filed Weights   04/03/22 0400  Weight: 76.6 kg    Examination: Physical exam: General: Acute on chronically ill-appearing female, lying on the bed HEENT: [place]/AT, eyes anicteric.  Moist mucus membranes Neuro: Eyes closed, opens eyes with light stimuli, following commands, pupils 3 mm bilateral reactive to light. Chest: No signs of respiratory distress, clear bilateral equal breath sounds,   Heart: Regular rate and rhythm, no murmurs or gallops Abdomen: Soft, non-tender, non-distended, bowel sounds present Extremities: Equal motor, sensory, and strength in all four extremities with +2 pulses. No edema noted in any extremity. Skin: No rash   Resolved Hospital Problem list     Assessment & Plan:  Breakthrough seizure with concern for status epilepticus Neurology is following We will continue with Keppra and Vimpat Continue as needed Ativan Patient will have continuous EEG, if that confirms she is in status epilepticus,  she may need to be intubated for for suppression Avoid deep sedation  Hypertensive emergency with concern for PRES Begin to wean Cleviprex infusion for goal of SBP <140 Continue hydralazine 10 mg every 4 hours as needed Resume oral antihypertensive once oral access has been established  Acute on chronic HFpEF with pulmonary edema with acute COPD exacerbation Continuous cardiac monitoring Continue IV Lasix 40 mg once daily Monitor intake and output Continue IV steroid and nebs  Acute urinary  tract infection with Klebsiella pneumonia with acute infectious encephalopathy Monitor fever curve and trend WBCs  Follow-up sensitivity result of urine culture Continue empiric Ceftriaxone pending sensitivity  Diabetes type 2 Continue Insulin sliding scale with CBG goal 140-180  Hypokalemia K 3.4 on 9/28  Continue aggressive electrolyte supplement Completed third round of potassium 10 mEq IVPB  Repeat BMP with Mag on 9/29         Best Practice (right click and "Reselect all SmartList Selections" daily)   Diet/type: NPO DVT prophylaxis: LMWH GI prophylaxis: N/A Lines: N/A Foley:  Yes, and it is still needed Code Status:  full code Last date of multidisciplinary goals of care discussion [pending]  Labs   CBC: Recent Labs  Lab 03/30/22 0721 03/30/22 1519 03/31/22 0524 04/02/22 1309 04/03/22 0402  WBC 7.9 6.7 7.2 8.9 7.6  NEUTROABS 4.9  --   --   --   --   HGB 12.2 12.4 12.0 14.3 13.8  HCT 38.7 38.4 37.7 45.0 43.1  MCV 84.3 82.9 83.8 83.3 83.7  PLT 189 173 172 220 861    Basic Metabolic Panel: Recent Labs  Lab 03/30/22 0721 03/30/22 1519 03/31/22 0524 04/02/22 1309 04/03/22 0402  NA 143  --  144 143 144  K 3.4*  --  3.7 3.5 3.4*  CL 112*  --  109 102 100  CO2 25  --  _0 GLUCOSE 114*  --  208* 149* 94  BUN 8  --  _1 CREATININE 0.56 0.62 0.66 0.65 0.68  CALCIUM 8.7*  --  8.8* 9.3 9.6  MG  --   --  1.9  --  2.0  PHOS  --   --   --   --  2.8   GFR: Estimated Creatinine Clearance: 64.1 mL/min (by C-G formula based on SCr of 0.68 mg/dL). Recent Labs  Lab 03/30/22 0721 03/30/22 1519 03/31/22 0524 04/02/22 1309 04/03/22 0402  WBC 7.9 6.7 7.2 8.9 7.6  LATICACIDVEN 1.0  --   --   --   --     Liver Function Tests: Recent Labs  Lab 03/30/22 0721 04/02/22 1309  AST 19 21  ALT 11 14  ALKPHOS 83 79  BILITOT 0.8 0.8  PROT 6.4* 7.6  ALBUMIN 3.4* 3.8   No results for input(s): "LIPASE", "AMYLASE" in the last 168 hours. Recent Labs   Lab 04/02/22 1309  AMMONIA 19    ABG    Component Value Date/Time   PHART 7.54 (H) 04/02/2022 1603   PCO2ART 41 04/02/2022 1603   PO2ART 58 (L) 04/02/2022 1603   HCO3 35.1 (H) 04/02/2022 1603   TCO2 27 12/21/2019 0738   ACIDBASEDEF 0.7 03/30/2022 0906   O2SAT 92.3 04/02/2022 1603     Coagulation Profile: No results for input(s): "INR", "PROTIME" in the last 168 hours.  Cardiac Enzymes: No results for input(s): "CKTOTAL", "CKMB", "CKMBINDEX", "TROPONINI" in the last 168 hours.  HbA1C: Hgb A1c MFr Bld  Date/Time Value Ref Range Status  12/04/2021 03:07 AM 6.2 (H) 4.8 - 5.6 % Final    Comment:    (NOTE) Pre diabetes:          5.7%-6.4%  Diabetes:              >6.4%  Glycemic control for   <7.0% adults with diabetes   04/28/2021 10:01 AM 7.2 (H) 4.8 - 5.6 % Final    Comment:    (NOTE)         Prediabetes: 5.7 - 6.4         Diabetes: >6.4         Glycemic control for adults with diabetes: <7.0     CBG: Recent Labs  Lab 04/02/22 1129 04/02/22 1742 04/02/22 2058 04/02/22 2331 04/03/22 0337  GLUCAP 141* 194* 172* 157* 98    Review of Systems:   Unable to obtain due to encephalopathy  Past Medical History:   Asthma    CHF (congestive heart failure) (HCC)    Cirrhosis, non-alcoholic (HCC)    COPD (chronic obstructive pulmonary disease) ([place])    Deaf    Depression    Diabetes mellitus without complication (HCC)    GERD (gastroesophageal reflux disease)    Heart murmur    Hepatitis    History of rheumatic fever    History of scarlet fever    Hypertension    IBS (irritable bowel syndrome)    Lymph node disorder     arm  Neuropathy    On home oxygen therapy     HS  Orthopnea    Osteoarthritis    RA (rheumatoid arthritis) (HCC)    RLS (restless legs syndrome)    Seizures (HCC)    Shortness of breath dyspnea    Sleep apnea    Stroke [facility])     TIA    Surgical History:   Past Surgical History:  Procedure Laterality Date   ABDOMINAL  HYSTERECTOMY     BREAST BIOPSY Right 10/30/2020   Stereo Bx, X-clip,  BENIGN BREAST TISSUE WITH FIBROADENOMATOUS   CATARACT EXTRACTION W/PHACO Right 11/23/2014   Procedure: CATARACT EXTRACTION PHACO AND INTRAOCULAR LENS PLACEMENT ([place]);  Surgeon: Lyla Glassing, MD;  Location: [facility] ORS;  Service: Ophthalmology;  Laterality: Right;   CATARACT EXTRACTION W/PHACO Left 12/14/2014   Procedure: CATARACT EXTRACTION PHACO AND INTRAOCULAR LENS PLACEMENT ([facility]);  Surgeon: Lyla Glassing, MD;  Location: [facility] ORS;  Service: Ophthalmology;  Laterality: Left;  US:01:16.6 AP:15.8 CDE:12.14   CESAREAN SECTION     CHOLECYSTECTOMY     COLONOSCOPY WITH PROPOFOL N/A 10/08/2020   Procedure: COLONOSCOPY WITH PROPOFOL;  Surgeon: Toledo, Benay Pike, MD;  Location: [facility] ENDOSCOPY;  Service: Gastroenterology;  Laterality: N/A;   ESOPHAGOGASTRODUODENOSCOPY (EGD) WITH PROPOFOL N/A 10/08/2020   Procedure: ESOPHAGOGASTRODUODENOSCOPY (EGD) WITH PROPOFOL;  Surgeon: Toledo, Benay Pike, MD;  Location: [facility] ENDOSCOPY;  Service: Gastroenterology;  Laterality: N/A;  DM DEAF, NEEDS SIGN INTERPRETER PER SON   REVERSE SHOULDER ARTHROPLASTY Right 12/21/2019   Procedure: REVERSE SHOULDER ARTHROPLASTY;  Surgeon: Lovell Sheehan, MD;  Location: [facility] ORS;  Service: Orthopedics;  Laterality: Right;   THUMB ARTHROSCOPY     TONSILLECTOMY     TYMPANOPLASTY     muliple     Social History:   reports that she has been smoking cigarettes. She started smoking about 54 years ago. She has a 26.00 pack-year smoking history. She has never used smokeless tobacco. She reports that she does not drink alcohol and does not use drugs.   Family History:  Her family history includes Breast cancer in her sister; CAD in her father; Lung cancer in her mother.   Allergies Allergies  Allergen Reactions   Aspirin Itching   Celebrex [Celecoxib] Itching    itching   Ciprofloxacin Itching   Codeine Itching   Fosphenytoin Itching   Levaquin  [Levofloxacin In D5w] Itching   Levofloxacin Itching   Lovastatin Itching   Penicillins     Documentation indicates severe reaction  Pt tolerated cephalosporin without adverse reaction 09/18    Pravastatin Itching   Sulfa Antibiotics Itching     Home Medications  Prior to Admission medications   Medication Sig Start Date End Date Taking? Authorizing Provider  acetaminophen (TYLENOL) 325 MG tablet Take 2 tablets (650 mg total) by mouth every 6 (six) hours as needed for mild pain (or Fever >/= 101). Patient not taking: Reported on 12/07/2019 08/08/19   Thornell Mule, MD  albuterol (PROVENTIL HFA;VENTOLIN HFA) 108 (90 BASE) MCG/ACT inhaler Inhale 2 puffs into the lungs every 4 (four) hours as needed for wheezing or shortness of breath. Patient not taking: Reported on 03/30/2022    [provider]  azithromycin (ZITHROMAX) 250 MG tablet One tab ngt daily for two more days 04/03/22   Loletha Grayer, MD  cefTRIAXone 1 g in sodium chloride 0.9 % 100 mL Inject 1 g into the vein daily. 04/02/22   Loletha Grayer, MD  citalopram (CELEXA) 10 MG tablet Place 1 tablet (10 mg total) into feeding tube daily. 04/02/22   Loletha Grayer, MD  clevidipine (CLEVIPREX) 0.5 MG/ML EMUL Inject 0-21 mg/hr into the vein continuous. 04/02/22   Loletha Grayer, MD  clopidogrel (PLAVIX) 75 MG tablet Place 1 tablet (75 mg total) into feeding tube daily. 04/02/22   Loletha Grayer, MD  feeding supplement (ENSURE ENLIVE / ENSURE PLUS) LIQD Take 237 mLs by mouth 3 (three) times daily between meals. 04/01/22   Lorella Nimrod, MD  furosemide (LASIX) 40 MG tablet Place 1 tablet (40 mg total) into feeding tube daily. 04/03/22   Loletha Grayer, MD  gabapentin (NEURONTIN) 250 MG/5ML solution Place 2 mLs (100 mg total) into feeding tube every 12 (twelve) hours. 04/02/22   Loletha Grayer, MD  glimepiride (AMARYL) 2 MG tablet Take 2 mg by mouth daily.    [provider]  guaiFENesin (MUCINEX) 600 MG 12 hr tablet  Take 1 tablet (600 mg total) by mouth 2 (two) times daily. 12/04/21 12/04/22  Lavina Hamman, MD  hydrALAZINE (APRESOLINE) 20 MG/ML injection Inject 0.25 mLs (5 mg total) into the vein every 4 (four) hours as needed (sbp greater than 180). 04/02/22   Wieting, Richard, MD  insulin aspart (NOVOLOG) 100 UNIT/ML injection Inject 0-15 Units into the skin 3 (three) times daily with meals. 04/01/22   Lorella Nimrod, MD  insulin aspart (NOVOLOG) 100 UNIT/ML injection Inject 0-5 Units into the skin at bedtime. 04/01/22   Lorella Nimrod, MD  ipratropium-albuterol (DUONEB) 0.5-2.5 (3) MG/3ML SOLN Take 3 mLs by nebulization in the morning, at noon, and at bedtime for 5 days. 12/04/21 03/30/22  Lavina Hamman, MD  lacosamide (VIMPAT) 200 MG TABS tablet Place 1 tablet (200 mg total) into feeding tube 2 (two) times daily. 04/02/22   Loletha Grayer, MD  levETIRAcetam (KEPPRA) 1000 MG/100ML SOLN Inject 100 mLs (1,000 mg total) into the vein every 12 (twelve) hours. 04/01/22   Lorella Nimrod, MD  losartan (COZAAR) 100 MG tablet Place 1 tablet (100 mg total) into feeding tube daily. 04/02/22   [place],  Richard, MD  methylPREDNISolone sodium succinate (SOLU-MEDROL) 40 mg/mL injection Inject 1 mL (40 mg total) into the vein daily. 04/02/22   Loletha Grayer, MD  metoprolol succinate (TOPROL-XL) 25 MG 24 hr tablet Take 1 tablet (25 mg total) by mouth daily. Hold for bradycardia 04/01/22   Lorella Nimrod, MD  montelukast (SINGULAIR) 10 MG tablet Place 1 tablet (10 mg total) into feeding tube at bedtime. 04/02/22   Loletha Grayer, MD  Multiple Vitamin (MULTIVITAMIN WITH MINERALS) TABS tablet Place 1 tablet into feeding tube daily. 04/02/22   Loletha Grayer, MD  Nutritional Supplements (FEEDING SUPPLEMENT, OSMOLITE 1.5 CAL,) LIQD Place 1,000 mLs into feeding tube continuous. 04/02/22   Loletha Grayer, MD  pantoprazole (PROTONIX) 40 MG tablet Take 40 mg by mouth daily.    [provider]  pantoprazole sodium (PROTONIX) 40 mg  Place 40 mg into feeding tube daily. 04/02/22   Loletha Grayer, MD  potassium chloride (KLOR-CON) 20 MEQ packet Place 20 mEq into feeding tube daily. 04/02/22   Loletha Grayer, MD  Protein (FEEDING SUPPLEMENT, PROSOURCE TF20,) liquid Place 60 mLs into feeding tube daily. 04/03/22   Loletha Grayer, MD  rosuvastatin (CRESTOR) 10 MG tablet Place 1 tablet (10 mg total) into feeding tube at bedtime. 04/02/22   Loletha Grayer, MD  SPIRIVA RESPIMAT 2.5 MCG/ACT AERS Inhale 2 puff as directed once a day 12/04/21   Lavina Hamman, MD  spironolactone (ALDACTONE) 25 MG tablet Take 25 mg by mouth daily. 09/25/21   [provider]  spironolactone (ALDACTONE) 25 MG tablet Place 1 tablet (25 mg total) into feeding tube daily. 04/02/22   Loletha Grayer, MD  Water For Irrigation, Sterile (FREE WATER) SOLN Place 30 mLs into feeding tube every 4 (four) hours. 04/02/22   Loletha Grayer, MD     Critical care time:     Total critical care time: 42 minutes  Performed by: Audie Box, Student NP   Critical care time was exclusive of separately billable procedures and treating other patients.   Critical care was necessary to treat or prevent imminent or life-threatening deterioration.   Critical care was time spent personally by me on the following activities: development of treatment plan with patient and/or surrogate as well as nursing, discussions with consultants, evaluation of patient's response to treatment, examination of patient, obtaining history from patient or surrogate, ordering and performing treatments and interventions, ordering and review of laboratory studies, ordering and review of radiographic studies, pulse oximetry and re-evaluation of patient's condition.  Audie Box, Student NP  [facility]

## 2022-04-03 NOTE — Procedures (Addendum)
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 EEG to evaluate for seizure  Level of alertness: Awake, asleep  AEDs during EEG study: LEV, LCM  Technical aspects: This EEG study was done with scalp electrodes positioned according to the 10-20 International system of electrode placement. Electrical activity was reviewed with band pass filter of 1-_0 , sensitivity of 7 uV/mm, display speed of 56m/sec with a _1  notched filter applied as appropriate. EEG data were recorded continuously and digitally stored.  Video monitoring was available and reviewed as appropriate.  Description: No clear posterior dominant rhythm was seen. Sleep was characterized by sleep spindles (12 to 14 Hz), maximal frontocentral region. EEG showed near continuous generalized 5 to 6 Hz theta slowing admixed with intermittent generalized low amplitude 2 to 3 Hz delta slowing. Hyperventilation and photic stimulation were not performed.     ABNORMALITY - Continuous slow, generalized  IMPRESSION: This study is suggestive of moderate to severe diffuse encephalopathy, nonspecific etiology. No seizures or epileptiform discharges were seen throughout the recording.  Chellie Vanlue OBarbra Sarks

## 2022-04-03 NOTE — Progress Notes (Signed)
EEG maintenance performed. No skin break down noted under frontal leads.

## 2022-04-03 NOTE — Progress Notes (Signed)
[place] Progress Note Patient Name: ISLEY WEISHEIT DOB: 1951-06-11 MRN: [id number]   Date of Service  04/03/2022  HPI/Events of Note  Patient c/o headache - NPO. No gastric tube. AST and ALT both normal.   eICU Interventions  Plan: Tylenol Suppository 650 mg PR Q 6 hours PRN pain or headache.      Intervention Category Major Interventions: Other:  Lysle Dingwall 04/03/2022, 9:39 PM

## 2022-04-03 NOTE — Progress Notes (Addendum)
NAME:  [name], MRN:  [id number], DOB:  [birth year], LOS: 1 ADMISSION DATE:  04/02/2022, CONSULTATION DATE: 04/02/2022 REFERRING MD: Dr. Jaclyn Shaggy, CHIEF COMPLAINT: Seizures  History of Present Illness:  71 year old female with chronic HFpEF, COPD, diabetes, seizure disorder who presented with progressively increasing shortness of breath at [facility] on 9/24, was admitted with acute COPD/acute on chronic HFpEF exacerbation, while in the ED at [facility] patient had witnessed seizures, she was given Ativan and loaded with Keppra, on further review it was noted patient missed Vimpat dose and her blood pressure went up to 932 systolic.  She was admitted under hospitalist service, she continued to have seizures, neurology was consulted, patient was started on Vimpat and Keppra, there was concern for status epilepticus versus PRES, patient was transferred to [facility] for continuous video EEG.  During my evaluation patient does have twitching of both upper eyelids, she is nonverbal at baseline, hard to know other complaints Pertinent  Medical History   Past Medical History:  Diagnosis Date   Asthma    CHF (congestive heart failure) (HCC)    Cirrhosis, non-alcoholic (HCC)    COPD (chronic obstructive pulmonary disease) ([place])    Deaf    Depression    Diabetes mellitus without complication ([place])    GERD (gastroesophageal reflux disease)    Heart murmur    Hepatitis    History of rheumatic fever    History of scarlet fever    Hypertension    IBS (irritable bowel syndrome)    Lymph node disorder    arm   Neuropathy    On home oxygen therapy    hs   Orthopnea    Osteoarthritis    RA (rheumatoid arthritis) (HCC)    RLS (restless legs syndrome)    Seizures (HCC)    Shortness of breath dyspnea    Sleep apnea    Stroke ([place])    [place] [facility] Events: Including procedures, antibiotic start and stop dates in addition to other pertinent events   9/27 admitted w/ SE.  Loaded w/ Keppra. Seizures subsided on LTM started on cleviprex for HTN. MRI obtained. NML. Neg for PRES  9/28 remains encephalopathic. LTM neg for seizure over night   Interim History / Subjective:  No distress. Slow to respond   Objective   Blood pressure (Abnormal) 134/50, pulse 70, temperature (Abnormal) 97.1 F (36.2 C), temperature source Axillary, resp. rate (Abnormal) 26, weight 76.6 kg, SpO2 92 %.        Intake/Output Summary (Last 24 hours) at 04/03/2022 1031 Last data filed at 04/03/2022 1000 Gross per 24 hour  Intake 643.69 ml  Output 455 ml  Net 188.69 ml   Filed Weights   04/03/22 0400  Weight: 76.6 kg    Examination: General 71 year old female laying in bed. Awakens to voice Neuro awake. Oriented X 1. Very hard of hearing. Has EEG mon in place. Tremulous HENT NCAT MMM Pulm clear Card rrr Abd soft  Ext warm and  dry  Gu voids    Resolved Hospital Problem list     Assessment & Plan:  Breakthrough seizure with for status epilepticus -no current seizures per eeg Plan Cont current LTM Cont current Vimpat and keppra Seizure precautions  Acute metabolic encephalopathy, likely residual post-ictal state, +/- hypertensive encephalopathy  Plan Treat seizures Avoid hyperglycemia, fever Supportive care   Hypertensive emergency : MRI negative for PRES Plan Wean cleviprex for SBP < 140 Resume home losartan,  toprol and lasix when can safely take POs  Acute on chronic HFpEF with pulmonary edema Plan Cont daily assessment for diuresis (on lasix IV daily)  Cont tele   Acute COPD exacerbation Plan D 2/5 systemic steroids Wean oxygen  Day 5 azith (dc after today) Scheduled BDs  Acute urinary tract infection with Klebsiella pneumonia Plan Day 2/5 ctx  Fluid and electrolyte imbalance: Hypokalemia Plan Replace and recheck   Diabetes type 2 Plan Ssi     Best Practice (right click and "Reselect all SmartList Selections" daily)   Diet/type:  NPO DVT prophylaxis: LMWH GI prophylaxis: N/A Lines: N/A Foley:  N/A Code Status:  full code Last date of multidisciplinary goals of care discussion [pending]    Critical care time: 32 min    Erick Colace ACNP-BC [place] Pager # [phone] OR # [phone] if no answer

## 2022-04-03 NOTE — Consult Note (Signed)
NEUROLOGY CONSULTATION NOTE   Date of service: April 03, 2022 Patient Name: [name] MRN:  [id number] DOB:  [birth year] Reason for consult: "seizures" Requesting Provider: Kipp Brood, MD _ _ _   _ __   _ __ _ _  __ __   _ __   __ _  History of Present Illness  [name] is a 71 y.o. 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 She was brought in to [facility] ED for breakthrough seizures in the setting of missed vimpat doses, Klebsiella UTI, and COPD exacerbation. Hospitalization complicated by flash pulmonary edema and hypertensive emergency with worsening acute on chronic hypoxic and hypercapnic respiratory failure. She was having spells at [facility] with facial twitching and fluctuating mentation concerning for seizure. Routine EEG with no seizures, repeat rEEG at [facility] on 9/27 with 1 left hemispheric electrographic seizure with R facial twitching, fidgeting and moaning. Further workup with MRI Brain with and without contrast with no acute intracranial abnormality. No findings concerning for PRES.  She was transferred to [place] for LTM EEG which has so far been negative for seizures. She is on currently on vimpat 240m BID and Keppra 10088mBID(max renal dose).  On my evaluation, eyes closed, does not open eyes to voice. Does not provide any history.  She had an episode with fluttering of her eyes and starring off in the space when I was in the room with no obvious changes on EEG on my brief review. I did push the EEG event button.   ROS   Unable to obtain.  Past History   Past Medical History:  Diagnosis Date   Asthma    CHF (congestive heart failure) (HCC)     Cirrhosis, non-alcoholic (HCC)    COPD ([place] obstructive [place] disease) ([place]   Deaf    Depression    Diabetes mellitus without complication (HCC)    GERD (gastroesophageal reflux disease)    Heart murmur    Hepatitis    History of rheumatic fever    History of scarlet fever    Hypertension    IBS (irritable bowel syndrome)    Lymph node disorder    arm   Neuropathy    On home oxygen therapy    hs   Orthopnea    Osteoarthritis    RA (rheumatoid arthritis) (HCC)    RLS (restless legs syndrome)    Seizures (HCC)    Shortness of breath dyspnea    Sleep apnea    Stroke ([place]   tia   Past Surgical History:  Procedure Laterality Date   ABDOMINAL HYSTERECTOMY     BREAST BIOPSY Right 10/30/2020   Stereo Bx, X-clip,  BENIGN BREAST TISSUE WITH FIBROADENOMATOUS   CATARACT EXTRACTION W/PHACO Right 11/23/2014   Procedure: CATARACT EXTRACTION PHACO AND INTRAOCULAR LENS PLACEMENT ([place]  Surgeon: NiLyla GlassingMD;  Location: [facility] ORS;  Service: Ophthalmology;  Laterality: Right;   CATARACT EXTRACTION W/PHACO Left 12/14/2014   Procedure: CATARACT EXTRACTION PHACO AND INTRAOCULAR LENS PLACEMENT (IOC);  Surgeon: NiLyla GlassingMD;  Location: [facility] ORS;  Service: Ophthalmology;  Laterality: Left;  US:01:16.6 AP:15.8 CDE:12.14   CESAREAN SECTION     CHOLECYSTECTOMY     COLONOSCOPY WITH PROPOFOL N/A 10/08/2020  Procedure: COLONOSCOPY WITH PROPOFOL;  Surgeon: Toledo, Benay Pike, MD;  Location: [facility] ENDOSCOPY;  Service: Gastroenterology;  Laterality: N/A;   ESOPHAGOGASTRODUODENOSCOPY (EGD) WITH PROPOFOL N/A 10/08/2020   Procedure: ESOPHAGOGASTRODUODENOSCOPY (EGD) WITH PROPOFOL;  Surgeon: Toledo, Benay Pike, MD;  Location: [facility] ENDOSCOPY;  Service: Gastroenterology;  Laterality: N/A;  DM DEAF, NEEDS SIGN INTERPRETER PER SON   REVERSE SHOULDER ARTHROPLASTY Right 12/21/2019   Procedure: REVERSE SHOULDER ARTHROPLASTY;  Surgeon: Lovell Sheehan, MD;  Location: [facility] ORS;  Service:  Orthopedics;  Laterality: Right;   THUMB ARTHROSCOPY     TONSILLECTOMY     TYMPANOPLASTY     muliple   Family History  Problem Relation Age of Onset   Lung cancer Mother    CAD Father    Breast cancer Sister    Social History   Socioeconomic History   Marital status: Widowed    Spouse name: Not on file   Number of children: Not on file   Years of education: Not on file   Highest education level: Not on file  Occupational History   Not on file  Tobacco Use   Smoking status: Every Day    Packs/day: 0.50    Years: 52.00    Total pack years: 26.00    Types: Cigarettes    Start date: 03/01/1968   Smokeless tobacco: Never  Vaping Use   Vaping Use: Former  Substance and Sexual Activity   Alcohol use: No   Drug use: No   Sexual activity: Not Currently  Other Topics Concern   Not on file  Social History Narrative   Pt lives in 1 story home with her son, his girlfriend and a family friend   Has 2 adult children   9th grade education   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    Social Determinants of Health   Financial Resource Strain: Not on file  Food Insecurity: No Food Insecurity (03/30/2022)   Hunger Vital Sign    Worried About Running Out of Food in the Last Year: Never true    Ran Out of Food in the Last Year: Never true  Transportation Needs: No Transportation Needs (03/30/2022)   PRAPARE - Hydrologist (Medical): No    Lack of Transportation (Non-Medical): No  Physical Activity: Not on file  Stress: Not on file  Social Connections: Not on file   Allergies  Allergen Reactions   Celebrex [Celecoxib] Itching    itching   Ciprofloxacin Itching   Codeine Itching   Fosphenytoin Itching   Levaquin [Levofloxacin In D5w] Itching   Levofloxacin Itching   Lovastatin Itching   Pravastatin Itching   Sulfa Antibiotics Itching   Aspirin Itching and Rash   Penicillins Rash    Documentation indicates severe  reaction  Pt tolerated cephalosporin without adverse reaction 09/18     Medications   Medications Prior to Admission  Medication Sig Dispense Refill Last Dose   acetaminophen (TYLENOL) 325 MG tablet Take 2 tablets (650 mg total) by mouth every 6 (six) hours as needed for mild pain (or Fever >/= 101). (Patient not taking: Reported on 12/07/2019) 60 tablet 1    albuterol (PROVENTIL HFA;VENTOLIN HFA) 108 (90 BASE) MCG/ACT inhaler Inhale 2 puffs into the lungs every 4 (four) hours as needed for wheezing or shortness of breath. (Patient not taking: Reported on 03/30/2022)      azithromycin (ZITHROMAX) 250 MG tablet One tab ngt daily for two more days  2 each 0    cefTRIAXone 1 g in sodium chloride 0.9 % 100 mL Inject 1 g into the vein daily.      citalopram (CELEXA) 10 MG tablet Place 1 tablet (10 mg total) into feeding tube daily.      clevidipine (CLEVIPREX) 0.5 MG/ML EMUL Inject 0-21 mg/hr into the vein continuous.      clopidogrel (PLAVIX) 75 MG tablet Place 1 tablet (75 mg total) into feeding tube daily.      feeding supplement (ENSURE ENLIVE / ENSURE PLUS) LIQD Take 237 mLs by mouth 3 (three) times daily between meals. 237 mL 12    furosemide (LASIX) 40 MG tablet Place 1 tablet (40 mg total) into feeding tube daily. 30 tablet     gabapentin (NEURONTIN) 250 MG/5ML solution Place 2 mLs (100 mg total) into feeding tube every 12 (twelve) hours.  12    glimepiride (AMARYL) 2 MG tablet Take 2 mg by mouth daily.      guaiFENesin (MUCINEX) 600 MG 12 hr tablet Take 1 tablet (600 mg total) by mouth 2 (two) times daily. 60 tablet 2    hydrALAZINE (APRESOLINE) 20 MG/ML injection Inject 0.25 mLs (5 mg total) into the vein every 4 (four) hours as needed (sbp greater than 180). 1 mL     insulin aspart (NOVOLOG) 100 UNIT/ML injection Inject 0-15 Units into the skin 3 (three) times daily with meals. 10 mL 11    insulin aspart (NOVOLOG) 100 UNIT/ML injection Inject 0-5 Units into the skin at bedtime. 10 mL 11     ipratropium-albuterol (DUONEB) 0.5-2.5 (3) MG/3ML SOLN Take 3 mLs by nebulization in the morning, at noon, and at bedtime for 5 days. 45 mL 0    lacosamide (VIMPAT) 200 MG TABS tablet Place 1 tablet (200 mg total) into feeding tube 2 (two) times daily. 60 tablet     levETIRAcetam (KEPPRA) 1000 MG/100ML SOLN Inject 100 mLs (1,000 mg total) into the vein every 12 (twelve) hours. 2000 mL     losartan (COZAAR) 100 MG tablet Place 1 tablet (100 mg total) into feeding tube daily.      methylPREDNISolone sodium succinate (SOLU-MEDROL) 40 mg/mL injection Inject 1 mL (40 mg total) into the vein daily. 1 each 0    metoprolol succinate (TOPROL-XL) 25 MG 24 hr tablet Take 1 tablet (25 mg total) by mouth daily. Hold for bradycardia 60 tablet 0    montelukast (SINGULAIR) 10 MG tablet Place 1 tablet (10 mg total) into feeding tube at bedtime.      Multiple Vitamin (MULTIVITAMIN WITH MINERALS) TABS tablet Place 1 tablet into feeding tube daily.      Nutritional Supplements (FEEDING SUPPLEMENT, OSMOLITE 1.5 CAL,) LIQD Place 1,000 mLs into feeding tube continuous.  0    pantoprazole (PROTONIX) 40 MG tablet Take 40 mg by mouth daily.      pantoprazole sodium (PROTONIX) 40 mg Place 40 mg into feeding tube daily.      potassium chloride (KLOR-CON) 20 MEQ packet Place 20 mEq into feeding tube daily.      Protein (FEEDING SUPPLEMENT, PROSOURCE TF20,) liquid Place 60 mLs into feeding tube daily.      rosuvastatin (CRESTOR) 10 MG tablet Place 1 tablet (10 mg total) into feeding tube at bedtime.      SPIRIVA RESPIMAT 2.5 MCG/ACT AERS Inhale 2 puff as directed once a day 4 g 0    spironolactone (ALDACTONE) 25 MG tablet Take 25 mg by mouth daily.  spironolactone (ALDACTONE) 25 MG tablet Place 1 tablet (25 mg total) into feeding tube daily.      Water For Irrigation, Sterile (FREE WATER) SOLN Place 30 mLs into feeding tube every 4 (four) hours.        Vitals   Vitals:   04/03/22 0841 04/03/22 0900 04/03/22 1000  04/03/22 1100  BP:  (!) 117/54 (!) 134/50 (!) 140/60  Pulse:  69 70 73  Resp:   (!) 26 (!) 25  Temp:      TempSrc:      SpO2: 96% 94% 92% 94%  Weight:         Body mass index is 29.91 kg/m.  Physical Exam   General: Laying comfortably in bed; in no acute distress. HENT: Normal oropharynx and mucosa. Normal external appearance of ears and nose.  Neck: Supple, no pain or tenderness  CV: No JVD. No peripheral edema.  Pulmonary: Symmetric Chest rise. Normal respiratory effort.  Abdomen: Soft to touch, non-tender.  Ext: No cyanosis, edema, or deformity  Skin: No rash. Normal palpation of skin.   Musculoskeletal: Normal digits and nails by inspection. No clubbing.  Neurologic Examination  Mental status/Cognition: no response to voice or loud clap, does not open eyes to vigorous tactile stimulation. She does open eyes to nares stimulation and then has eye twitching with starring off. Speech/language: none, mute. Cranial nerves:   CN II Pupils equal and reactive to light, unable to assess for VF deficit.   CN III,IV,VI Dolls eyes +   CN V Closes eyes shut   CN VII Symmetric facial grimace to nare stimulation.   CN VIII Unable to assess   CN IX & X Unable to asesss   CN XI Head midline   CN XII midline tongue but does not protrude on command.   Motor/sensory:  Muscle bulk:normal, tone normal No response to babinski or mild pinch in any of the extremities.  Coordination/Complex Motor:  - Finger to Nose unable to assess.  Labs   CBC:  Recent Labs  Lab 03/30/22 0721 03/30/22 1519 04/02/22 1309 04/03/22 0402  WBC 7.9   < > 8.9 7.6  NEUTROABS 4.9  --   --   --   HGB 12.2   < > 14.3 13.8  HCT 38.7   < > 45.0 43.1  MCV 84.3   < > 83.3 83.7  PLT 189   < > 220 195   < > = values in this interval not displayed.    Basic Metabolic Panel:  Lab Results  Component Value Date   NA 144 04/03/2022   K 3.4 (L) 04/03/2022   CO2 31 04/03/2022   GLUCOSE 94 04/03/2022   BUN 15  04/03/2022   CREATININE 0.68 04/03/2022   CALCIUM 9.6 04/03/2022   GFRNONAA >60 04/03/2022   GFRAA >60 12/24/2019   Lipid Panel:  Lab Results  Component Value Date   LDLCALC 66 04/28/2021   HgbA1c:  Lab Results  Component Value Date   HGBA1C 6.2 (H) 12/04/2021   Urine Drug Screen:     Component Value Date/Time   LABOPIA NONE DETECTED 03/31/2022 0753   COCAINSCRNUR NONE DETECTED 03/31/2022 0753   LABBENZ POSITIVE (A) 03/31/2022 0753   AMPHETMU NONE DETECTED 03/31/2022 0753   THCU NONE DETECTED 03/31/2022 0753   LABBARB NONE DETECTED 03/31/2022 0753    Alcohol Level     Component Value Date/Time   [facility] <10 09/29/2021 2136    MRI Brain(Personally reviewed): 1. No  acute intracranial abnormality.  No findings of PRES. 2. Mild-to-moderate chronic small vessel ischemic disease.  rEEG: 04/02/22 Impression and clinical correlation: This EEG was obtained while awake and drowsy and is abnormal due to: - Moderate-to-severe diffuse slowing - Intermittent lateralized periodic discharges over the left hemisphere at 1 Hz - One electrographic seizure at 1807 manifesting as sharply contoured rhythmic theta with admixed sharp wave discharges with evolution most prominent over the left hemisphere that lasted approximately one minute and resolved without intervention. Clinically during this event she had right facial twitching, fidgeting, and moaning.  cEEG: 04/03/22 This study is suggestive of moderate to severe diffuse encephalopathy, nonspecific etiology. No seizures or epileptiform discharges were seen throughout the recording.  Impression   [name] is a 71 y.o. 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 She was brought in to [facility] ED for breakthrough seizures in the setting of missed vimpat doses, Klebsiella UTI, and COPD exacerbation. Hospitalization complicated by flash pulmonary edema and hypertensive emergency with worsening acute on chronic hypoxic and hypercapnic respiratory failure. She was having spells at [facility] with facial twitching and fluctuating mentation concerning for seizure. Routine EEG with no seizures, repeat rEEG at [facility] on 9/27 with 1 left hemispheric electrographic seizure with R facial twitching, fidgeting and moaning. Further workup with MRI Brain with and without contrast with no acute intracranial abnormality. No findings concerning for PRES.  She was transferred to [place] for LTM EEG which has been negative for seizures so far.  Recommendations  - continue Keppra 1071m BID along with vimpat 2067mBID - continue LTM EEG. - Recommend gradual normotension. - seizure precautions - Ativan for seizure lasting more than 5 mins.  ______________________________________________________________________   Thank you for the opportunity to take part in the care of this patient. If you have any further questions, please contact the neurology consultation attending.  Signed,  SaHennepinager Number [phone] _ _   _ __   _ __ _ _  __ __   _ __   __ _

## 2022-04-04 ENCOUNTER — Inpatient Hospital Stay (HOSPITAL_COMMUNITY): Payer: Medicare Other

## 2022-04-04 DIAGNOSIS — G9341 Metabolic encephalopathy: Secondary | ICD-10-CM | POA: Diagnosis not present

## 2022-04-04 LAB — BASIC METABOLIC PANEL
Anion gap: 12 (ref 5–15)
Anion gap: 10 (ref 5–15)
BUN: 17 mg/dL (ref 8–23)
BUN: 23 mg/dL (ref 8–23)
CO2: 31 mmol/L (ref 22–32)
CO2: 26 mmol/L (ref 22–32)
Calcium: 9.1 mg/dL (ref 8.9–10.3)
Calcium: 8.7 mg/dL — ABNORMAL LOW (ref 8.9–10.3)
Chloride: 98 mmol/L (ref 98–111)
Chloride: 104 mmol/L (ref 98–111)
Creatinine, Ser: 0.74 mg/dL (ref 0.44–1.00)
Creatinine, Ser: 0.79 mg/dL (ref 0.44–1.00)
GFR, Estimated: >60 mL/min (ref >60)
GFR, Estimated: >60 mL/min (ref >60)
Glucose, Bld: 77 mg/dL (ref 70–99)
Glucose, Bld: 207 mg/dL — ABNORMAL HIGH (ref 70–99)
Potassium: 3 mmol/L — ABNORMAL LOW (ref 3.5–5.1)
Potassium: 5.1 mmol/L (ref 3.5–5.1)
Sodium: 141 mmol/L (ref 135–145)
Sodium: 140 mmol/L (ref 135–145)

## 2022-04-04 LAB — GLUCOSE, CAPILLARY
Glucose-Capillary: 78 mg/dL (ref 70–99)
Glucose-Capillary: 90 mg/dL (ref 70–99)
Glucose-Capillary: 148 mg/dL — ABNORMAL HIGH (ref 70–99)
Glucose-Capillary: 199 mg/dL — ABNORMAL HIGH (ref 70–99)
Glucose-Capillary: 216 mg/dL — ABNORMAL HIGH (ref 70–99)
Glucose-Capillary: 118 mg/dL — ABNORMAL HIGH (ref 70–99)

## 2022-04-04 LAB — MAGNESIUM
Magnesium: 2.1 mg/dL (ref 1.7–2.4)
Magnesium: 1.9 mg/dL (ref 1.7–2.4)

## 2022-04-04 LAB — TRIGLYCERIDES: Triglycerides: 118 mg/dL (ref <150)

## 2022-04-04 LAB — PHOSPHORUS: Phosphorus: 3 mg/dL (ref 2.5–4.6)

## 2022-04-04 MED ORDER — FUROSEMIDE 40 MG PO TABS
40.0000 mg | ORAL_TABLET | Freq: Every day | ORAL | Status: DC
  Administered 2022-04-05: 40 mg
  Filled 2022-04-04: qty 1

## 2022-04-04 MED ORDER — JEVITY 1.5 CAL/FIBER PO LIQD
1000.0000 mL | ORAL | Status: DC
  Administered 2022-04-04: 1000 mL
  Filled 2022-04-04 (×5): qty 1000

## 2022-04-04 MED ORDER — PROSOURCE TF20 ENFIT COMPATIBL EN LIQD
60.0000 mL | Freq: Every day | ENTERAL | Status: DC
  Administered 2022-04-04 – 2022-04-05 (×2): 60 mL
  Filled 2022-04-04 (×2): qty 60

## 2022-04-04 MED ORDER — GABAPENTIN 250 MG/5ML PO SOLN
100.0000 mg | Freq: Two times a day (BID) | ORAL | Status: DC
  Administered 2022-04-04 – 2022-04-05 (×3): 100 mg
  Filled 2022-04-04 (×4): qty 2

## 2022-04-04 MED ORDER — POTASSIUM CHLORIDE 10 MEQ/100ML IV SOLN
10.0000 meq | INTRAVENOUS | Status: AC
  Administered 2022-04-04 (×6): 10 meq via INTRAVENOUS
  Filled 2022-04-04 (×3): qty 100

## 2022-04-04 MED ORDER — LOSARTAN POTASSIUM 50 MG PO TABS
100.0000 mg | ORAL_TABLET | Freq: Every day | ORAL | Status: DC
  Administered 2022-04-04 – 2022-04-05 (×2): 100 mg
  Filled 2022-04-04 (×2): qty 2

## 2022-04-04 MED ORDER — LIDOCAINE HCL URETHRAL/MUCOSAL 2 % EX GEL
1.0000 | Freq: Once | CUTANEOUS | Status: DC
  Filled 2022-04-04: qty 6

## 2022-04-04 MED ORDER — DOCUSATE SODIUM 50 MG/5ML PO LIQD
100.0000 mg | Freq: Two times a day (BID) | ORAL | Status: DC
  Administered 2022-04-04: 100 mg
  Filled 2022-04-04: qty 10

## 2022-04-04 MED ORDER — POTASSIUM CHLORIDE 20 MEQ PO PACK
40.0000 meq | PACK | Freq: Two times a day (BID) | ORAL | Status: DC
  Administered 2022-04-04 – 2022-04-05 (×3): 40 meq
  Filled 2022-04-04 (×3): qty 2

## 2022-04-04 MED ORDER — CLOPIDOGREL BISULFATE 75 MG PO TABS
75.0000 mg | ORAL_TABLET | Freq: Every day | ORAL | Status: DC
  Administered 2022-04-04 – 2022-04-05 (×2): 75 mg
  Filled 2022-04-04 (×2): qty 1

## 2022-04-04 MED ORDER — POLYETHYLENE GLYCOL 3350 17 G PO PACK
17.0000 g | PACK | Freq: Every day | ORAL | Status: DC
  Administered 2022-04-04: 17 g
  Filled 2022-04-04: qty 1

## 2022-04-04 MED ORDER — CITALOPRAM HYDROBROMIDE 10 MG PO TABS
10.0000 mg | ORAL_TABLET | Freq: Every day | ORAL | Status: DC
  Administered 2022-04-04 – 2022-04-05 (×2): 10 mg
  Filled 2022-04-04 (×2): qty 1

## 2022-04-04 NOTE — Progress Notes (Addendum)
Initial Nutrition Assessment  DOCUMENTATION CODES:   Not applicable  INTERVENTION:   Initiate tube feeding via NG tube: Jevity 1.5 at 45 ml/h (1080 ml per day) Prosource TF20 60 ml daily  Provides 1700 kcal, 88 gm protein, 820 ml free water daily   NUTRITION DIAGNOSIS:   Inadequate oral intake related to inability to eat as evidenced by NPO status.  GOAL:   Patient will meet greater than or equal to 90% of their needs  MONITOR:   TF tolerance  REASON FOR ASSESSMENT:   Consult, Rounds Enteral/tube feeding initiation and management  ASSESSMENT:   Pt with PMH of COPD, HTN, CHF, DM, sz disorder who is deaf (can read lips) and lives at home with her son admitted with breakthrough vs pseudoseizure and hypertensive emergency.   Pt discussed during ICU rounds and with RN and CCM NP. Ok to start TF. NG tube placed. Pt remains on cEEG.   Spoke with son and patient, son mouthed words to patient.  He reports pt lives with him and his fiance. She may have lost a couple of pounds but he is unsure. He feels she does not eat much. Reports pt mostly sits all day.  Breakfast: Biscuit, sunny D, ensure Lunch: sometimes she eats and sometimes she does not Dinner: small portion of her meal He reports that pt snacks all day. She has snacks in her room which include Little Debbie cakes.  He reports some issues with swallowing and has been told before not to use straws but she continues to use them.   Medications reviewed and include: colace, lasix, SSI, solumedrol, miralax, KCl 40 mEq BID Cleviprex @ 8 ml/hr  Vimpat Keppra   Labs reviewed: K 3   10 F NG tube; per xray gastric tube at pylorus   NUTRITION - FOCUSED PHYSICAL EXAM:  Flowsheet Row Most Recent Value  Orbital Region No depletion  Upper Arm Region No depletion  Thoracic and Lumbar Region No depletion  Buccal Region No depletion  Temple Region No depletion  Clavicle Bone Region No depletion  Clavicle and Acromion Bone  Region No depletion  Scapular Bone Region No depletion  Dorsal Hand Mild depletion  Patellar Region Mild depletion  Anterior Thigh Region Mild depletion  Posterior Calf Region Mild depletion  Edema (RD Assessment) None  Hair Reviewed  Eyes Reviewed  Mouth Reviewed  Skin Reviewed  Nails Reviewed       Diet Order:   Diet Order             Diet NPO time specified  Diet effective now                   EDUCATION NEEDS:   No education needs have been identified at this time  Skin:     Last BM:  9/26  Height:   Ht Readings from Last 1 Encounters:  03/31/22 5' 3" (1.6 m)    Weight:   Wt Readings from Last 1 Encounters:  04/04/22 75.5 kg    BMI:  Body mass index is 29.48 kg/m.  Estimated Nutritional Needs:   Kcal:  1700-1900  Protein:  80-100 grams  Fluid:  >1.7 L/day  Lockie Pares., RD, LDN, CNSC See AMiON for contact information

## 2022-04-04 NOTE — Progress Notes (Cosign Needed)
NAME:  [name], MRN:  [id number], DOB:  [birth year], LOS: 2 ADMISSION DATE:  04/02/2022, CONSULTATION DATE: 04/02/2022 REFERRING MD: Dr. Jaclyn Shaggy, CHIEF COMPLAINT: Seizures  History of Present Illness:  71 year old female with chronic HFpEF, COPD, diabetes, seizure disorder who presented with progressively increasing shortness of breath at [facility] on 9/24, was admitted with acute COPD/acute on chronic HFpEF exacerbation, while in the ED at [facility] patient had witnessed seizures, she was given Ativan and loaded with Keppra, on further review it was noted patient missed Vimpat dose and her blood pressure went up to 629 systolic.  She was admitted under hospitalist service, she continued to have seizures, neurology was consulted, patient was started on Vimpat and Keppra, there was concern for status epilepticus versus PRES, patient was transferred to [facility] for continuous video EEG.  She is nonverbal at baseline, hard to know other complaints Pertinent  Medical History   Past Medical History:  Diagnosis Date   Asthma    CHF (congestive heart failure) (HCC)    Cirrhosis, non-alcoholic (HCC)    COPD (chronic obstructive pulmonary disease) ([place])    Deaf    Depression    Diabetes mellitus without complication ([place])    GERD (gastroesophageal reflux disease)    Heart murmur    Hepatitis    History of rheumatic fever    History of scarlet fever    Hypertension    IBS (irritable bowel syndrome)    Lymph node disorder    arm   Neuropathy    On home oxygen therapy    hs   Orthopnea    Osteoarthritis    RA (rheumatoid arthritis) (HCC)    RLS (restless legs syndrome)    Seizures (HCC)    Shortness of breath dyspnea    Sleep apnea    Stroke ([place])    [place] [facility] Events: Including procedures, antibiotic start and stop dates in addition to other pertinent events   - 9/27 - Transferred from [facility] for continuous EEG monitoring, No acute intracranial abnormality.   No findings of PRES.  - 9/28 - Initial EEG reviewed, no ongoing seizure activity, continue Vimpat and Keppra. -9/29 - She is more wakeful this morning and following commands. She remains confused. Bedside swallow eval failed. NGT placed and begin transition to per tube meds.    Interim History / Subjective:   Objective   Blood pressure (!) 117/52, pulse 63, temperature 98 F (36.7 C), temperature source Axillary, resp. rate (!) 21, weight 75.5 kg, SpO2 96 %.        Intake/Output Summary (Last 24 hours) at 04/04/2022 0710 Last data filed at 04/04/2022 0600 Gross per 24 hour  Intake 788.1 ml  Output 2393 ml  Net -1604.9 ml   Filed Weights   04/03/22 0400 04/04/22 0500  Weight: 76.6 kg 75.5 kg    Examination: Physical exam: General: Acute on chronically ill-appearing female, lying on the bed HEENT: [place]/AT, eyes anicteric.  Moist mucus membranes Neuro: Eyes closed, opens eyes with light stimuli, following commands, pupils 3 mm bilateral reactive to light. Chest: No signs of respiratory distress, clear bilateral equal breath sounds,   Heart: Regular rate and rhythm, no murmurs or gallops Abdomen: Soft, non-tender, non-distended, bowel sounds present Extremities: Equal motor, sensory, and strength in all four extremities with +2 pulses. No edema noted in any extremity. Skin: No rash   Resolved Hospital Problem list     Assessment & Plan:  Breakthrough seizure  with concern for status epilepticus Neurology is following We will continue with Keppra and Vimpat Continue as needed Ativan Seizure precautions  Will start her Neurontin back when she can take POs or give per tube.   Hypertensive emergency with concern for PRES Begin to wean Cleviprex infusion for goal of SBP <140 Start BP meds per tube once placed.   Acute on chronic HFpEF with pulmonary edema with acute COPD exacerbation Continuous cardiac monitoring Change Lasix to per tube once placed. Monitor intake and  output Continue IV Solu-Medrol 64m IV daily for four days (started on 9/29).   Acute urinary tract infection with Klebsiella pneumonia with acute infectious encephalopathy Monitor fever curve and trend WBCs  Follow-up sensitivity result of urine culture Sensitive to Ceftriaxone. Continue ceftriaxone 2gm IV daily for four days (started on 9/28).    Diabetes type 2 Continue Insulin sliding scale with CBG goal 140-180  Fluid Electrolyte Imbalance: Hypokalemia K 3.4 on 9/28 and now 3.0 on 9/29 Continue aggressive electrolyte supplement Potassium 10 mEq IVPB x6 (total 60 mEq) ordered  Repeat BMP at 1700 today.          Best Practice (right click and "Reselect all SmartList Selections" daily)   Diet/type: NPO DVT prophylaxis: LMWH GI prophylaxis: N/A Lines: N/A Foley:  Yes, and it is still needed Code Status:  full code Last date of multidisciplinary goals of care discussion [pending]  Labs   CBC: Recent Labs  Lab 03/30/22 0721 03/30/22 1519 03/31/22 0524 04/02/22 1309 04/03/22 0402  WBC 7.9 6.7 7.2 8.9 7.6  NEUTROABS 4.9  --   --   --   --   HGB 12.2 12.4 12.0 14.3 13.8  HCT 38.7 38.4 37.7 45.0 43.1  MCV 84.3 82.9 83.8 83.3 83.7  PLT 189 173 172 220 1794   Basic Metabolic Panel: Recent Labs  Lab 03/30/22 0721 03/30/22 1519 03/31/22 0524 04/02/22 1309 04/03/22 0402 04/04/22 0327  NA 143  --  144 143 144 141  K 3.4*  --  3.7 3.5 3.4* 3.0*  CL 112*  --  109 102 100 98  CO2 25  --  _0 GLUCOSE 114*  --  208* 149* 94 77  BUN 8  --  _1 CREATININE 0.56 0.62 0.66 0.65 0.68 0.74  CALCIUM 8.7*  --  8.8* 9.3 9.6 9.1  MG  --   --  1.9  --  2.0 2.1  PHOS  --   --   --   --  2.8  --    GFR: Estimated Creatinine Clearance: 63.6 mL/min (by C-G formula based on SCr of 0.74 mg/dL). Recent Labs  Lab 03/30/22 0721 03/30/22 1519 03/31/22 0524 04/02/22 1309 04/03/22 0402  WBC 7.9 6.7 7.2 8.9 7.6  LATICACIDVEN 1.0  --   --   --   --      Liver Function Tests: Recent Labs  Lab 03/30/22 0721 04/02/22 1309  AST 19 21  ALT 11 14  ALKPHOS 83 79  BILITOT 0.8 0.8  PROT 6.4* 7.6  ALBUMIN 3.4* 3.8   No results for input(s): "LIPASE", "AMYLASE" in the last 168 hours. Recent Labs  Lab 04/02/22 1309  AMMONIA 19    ABG    Component Value Date/Time   PHART 7.54 (H) 04/02/2022 1603   PCO2ART 41 04/02/2022 1603   PO2ART 58 (L) 04/02/2022 1603   HCO3 35.1 (H) 04/02/2022 1603   TCO2 27 12/21/2019 0738  ACIDBASEDEF 0.7 03/30/2022 0906   O2SAT 92.3 04/02/2022 1603     Coagulation Profile: No results for input(s): "INR", "PROTIME" in the last 168 hours.  Cardiac Enzymes: No results for input(s): "CKTOTAL", "CKMB", "CKMBINDEX", "TROPONINI" in the last 168 hours.  HbA1C: Hgb A1c MFr Bld  Date/Time Value Ref Range Status  12/04/2021 03:07 AM 6.2 (H) 4.8 - 5.6 % Final    Comment:    (NOTE) Pre diabetes:          5.7%-6.4%  Diabetes:              >6.4%  Glycemic control for   <7.0% adults with diabetes   04/28/2021 10:01 AM 7.2 (H) 4.8 - 5.6 % Final    Comment:    (NOTE)         Prediabetes: 5.7 - 6.4         Diabetes: >6.4         Glycemic control for adults with diabetes: <7.0     CBG: Recent Labs  Lab 04/03/22 1125 04/03/22 1543 04/03/22 1946 04/03/22 2355 04/04/22 0351  GLUCAP 123* 231* 137* 125* 78    Review of Systems:   Unable to obtain due to encephalopathy  Past Medical History:   Asthma    CHF (congestive heart failure) (HCC)    Cirrhosis, non-alcoholic (HCC)    COPD (chronic obstructive pulmonary disease) ([place])    Deaf    Depression    Diabetes mellitus without complication (HCC)    GERD (gastroesophageal reflux disease)    Heart murmur    Hepatitis    History of rheumatic fever    History of scarlet fever    Hypertension    IBS (irritable bowel syndrome)    Lymph node disorder     arm  Neuropathy    On home oxygen therapy     HS  Orthopnea    Osteoarthritis    RA  (rheumatoid arthritis) (HCC)    RLS (restless legs syndrome)    Seizures (HCC)    Shortness of breath dyspnea    Sleep apnea    Stroke [facility])     TIA    Surgical History:   Past Surgical History:  Procedure Laterality Date   ABDOMINAL HYSTERECTOMY     BREAST BIOPSY Right 10/30/2020   Stereo Bx, X-clip,  BENIGN BREAST TISSUE WITH FIBROADENOMATOUS   CATARACT EXTRACTION W/PHACO Right 11/23/2014   Procedure: CATARACT EXTRACTION PHACO AND INTRAOCULAR LENS PLACEMENT ([place]);  Surgeon: Lyla Glassing, MD;  Location: [facility] ORS;  Service: Ophthalmology;  Laterality: Right;   CATARACT EXTRACTION W/PHACO Left 12/14/2014   Procedure: CATARACT EXTRACTION PHACO AND INTRAOCULAR LENS PLACEMENT (IOC);  Surgeon: Lyla Glassing, MD;  Location: [facility] ORS;  Service: Ophthalmology;  Laterality: Left;  US:01:16.6 AP:15.8 CDE:12.14   CESAREAN SECTION     CHOLECYSTECTOMY     COLONOSCOPY WITH PROPOFOL N/A 10/08/2020   Procedure: COLONOSCOPY WITH PROPOFOL;  Surgeon: Toledo, Benay Pike, MD;  Location: [facility] ENDOSCOPY;  Service: Gastroenterology;  Laterality: N/A;   ESOPHAGOGASTRODUODENOSCOPY (EGD) WITH PROPOFOL N/A 10/08/2020   Procedure: ESOPHAGOGASTRODUODENOSCOPY (EGD) WITH PROPOFOL;  Surgeon: Toledo, Benay Pike, MD;  Location: [facility] ENDOSCOPY;  Service: Gastroenterology;  Laterality: N/A;  DM DEAF, NEEDS SIGN INTERPRETER PER SON   REVERSE SHOULDER ARTHROPLASTY Right 12/21/2019   Procedure: REVERSE SHOULDER ARTHROPLASTY;  Surgeon: Lovell Sheehan, MD;  Location: [facility] ORS;  Service: Orthopedics;  Laterality: Right;   THUMB ARTHROSCOPY     TONSILLECTOMY     TYMPANOPLASTY  muliple     Social History:   reports that she has been smoking cigarettes. She started smoking about 54 years ago. She has a 26.00 pack-year smoking history. She has never used smokeless tobacco. She reports that she does not drink alcohol and does not use drugs.   Family History:  Her family history includes Breast cancer in her sister;  CAD in her father; Lung cancer in her mother.   Allergies Allergies  Allergen Reactions   Celebrex [Celecoxib] Itching    itching   Ciprofloxacin Itching   Codeine Itching   Fosphenytoin Itching   Levaquin [Levofloxacin In D5w] Itching   Levofloxacin Itching   Lovastatin Itching   Pravastatin Itching   Sulfa Antibiotics Itching   Aspirin Itching and Rash   Penicillins Rash    Documentation indicates severe reaction  Pt tolerated cephalosporin without adverse reaction 09/18      Home Medications  Prior to Admission medications   Medication Sig Start Date End Date Taking? Authorizing Provider  acetaminophen (TYLENOL) 325 MG tablet Take 2 tablets (650 mg total) by mouth every 6 (six) hours as needed for mild pain (or Fever >/= 101). Patient not taking: Reported on 12/07/2019 08/08/19   Thornell Mule, MD  albuterol (PROVENTIL HFA;VENTOLIN HFA) 108 (90 BASE) MCG/ACT inhaler Inhale 2 puffs into the lungs every 4 (four) hours as needed for wheezing or shortness of breath. Patient not taking: Reported on 03/30/2022    [provider]  azithromycin (ZITHROMAX) 250 MG tablet One tab ngt daily for two more days 04/03/22   Loletha Grayer, MD  cefTRIAXone 1 g in sodium chloride 0.9 % 100 mL Inject 1 g into the vein daily. 04/02/22   Loletha Grayer, MD  citalopram (CELEXA) 10 MG tablet Place 1 tablet (10 mg total) into feeding tube daily. 04/02/22   Loletha Grayer, MD  clevidipine (CLEVIPREX) 0.5 MG/ML EMUL Inject 0-21 mg/hr into the vein continuous. 04/02/22   Loletha Grayer, MD  clopidogrel (PLAVIX) 75 MG tablet Place 1 tablet (75 mg total) into feeding tube daily. 04/02/22   Loletha Grayer, MD  feeding supplement (ENSURE ENLIVE / ENSURE PLUS) LIQD Take 237 mLs by mouth 3 (three) times daily between meals. 04/01/22   Lorella Nimrod, MD  furosemide (LASIX) 40 MG tablet Place 1 tablet (40 mg total) into feeding tube daily. 04/03/22   Loletha Grayer, MD  gabapentin (NEURONTIN) 250  MG/5ML solution Place 2 mLs (100 mg total) into feeding tube every 12 (twelve) hours. 04/02/22   Loletha Grayer, MD  glimepiride (AMARYL) 2 MG tablet Take 2 mg by mouth daily.    [provider]  guaiFENesin (MUCINEX) 600 MG 12 hr tablet Take 1 tablet (600 mg total) by mouth 2 (two) times daily. 12/04/21 12/04/22  Lavina Hamman, MD  hydrALAZINE (APRESOLINE) 20 MG/ML injection Inject 0.25 mLs (5 mg total) into the vein every 4 (four) hours as needed (sbp greater than 180). 04/02/22   Wieting, Richard, MD  insulin aspart (NOVOLOG) 100 UNIT/ML injection Inject 0-15 Units into the skin 3 (three) times daily with meals. 04/01/22   Lorella Nimrod, MD  insulin aspart (NOVOLOG) 100 UNIT/ML injection Inject 0-5 Units into the skin at bedtime. 04/01/22   Lorella Nimrod, MD  ipratropium-albuterol (DUONEB) 0.5-2.5 (3) MG/3ML SOLN Take 3 mLs by nebulization in the morning, at noon, and at bedtime for 5 days. 12/04/21 03/30/22  Lavina Hamman, MD  lacosamide (VIMPAT) 200 MG TABS tablet Place 1 tablet (200 mg total) into feeding  tube 2 (two) times daily. 04/02/22   Loletha Grayer, MD  levETIRAcetam (KEPPRA) 1000 MG/100ML SOLN Inject 100 mLs (1,000 mg total) into the vein every 12 (twelve) hours. 04/01/22   Lorella Nimrod, MD  losartan (COZAAR) 100 MG tablet Place 1 tablet (100 mg total) into feeding tube daily. 04/02/22   Loletha Grayer, MD  methylPREDNISolone sodium succinate (SOLU-MEDROL) 40 mg/mL injection Inject 1 mL (40 mg total) into the vein daily. 04/02/22   Loletha Grayer, MD  metoprolol succinate (TOPROL-XL) 25 MG 24 hr tablet Take 1 tablet (25 mg total) by mouth daily. Hold for bradycardia 04/01/22   Lorella Nimrod, MD  montelukast (SINGULAIR) 10 MG tablet Place 1 tablet (10 mg total) into feeding tube at bedtime. 04/02/22   Loletha Grayer, MD  Multiple Vitamin (MULTIVITAMIN WITH MINERALS) TABS tablet Place 1 tablet into feeding tube daily. 04/02/22   Loletha Grayer, MD  Nutritional Supplements  (FEEDING SUPPLEMENT, OSMOLITE 1.5 CAL,) LIQD Place 1,000 mLs into feeding tube continuous. 04/02/22   Loletha Grayer, MD  pantoprazole (PROTONIX) 40 MG tablet Take 40 mg by mouth daily.    [provider]  pantoprazole sodium (PROTONIX) 40 mg Place 40 mg into feeding tube daily. 04/02/22   Loletha Grayer, MD  potassium chloride (KLOR-CON) 20 MEQ packet Place 20 mEq into feeding tube daily. 04/02/22   Loletha Grayer, MD  Protein (FEEDING SUPPLEMENT, PROSOURCE TF20,) liquid Place 60 mLs into feeding tube daily. 04/03/22   Loletha Grayer, MD  rosuvastatin (CRESTOR) 10 MG tablet Place 1 tablet (10 mg total) into feeding tube at bedtime. 04/02/22   Loletha Grayer, MD  SPIRIVA RESPIMAT 2.5 MCG/ACT AERS Inhale 2 puff as directed once a day 12/04/21   Lavina Hamman, MD  spironolactone (ALDACTONE) 25 MG tablet Take 25 mg by mouth daily. 09/25/21   [provider]  spironolactone (ALDACTONE) 25 MG tablet Place 1 tablet (25 mg total) into feeding tube daily. 04/02/22   Loletha Grayer, MD  Water For Irrigation, Sterile (FREE WATER) SOLN Place 30 mLs into feeding tube every 4 (four) hours. 04/02/22   Loletha Grayer, MD     Critical care time:     Total critical care time: 42 minutes  Performed by: Audie Box, Student NP   Critical care time was exclusive of separately billable procedures and treating other patients.   Critical care was necessary to treat or prevent imminent or life-threatening deterioration.   Critical care was time spent personally by me on the following activities: development of treatment plan with patient and/or surrogate as well as nursing, discussions with consultants, evaluation of patient's response to treatment, examination of patient, obtaining history from patient or surrogate, ordering and performing treatments and interventions, ordering and review of laboratory studies, ordering and review of radiographic studies, pulse oximetry and re-evaluation  of patient's condition.  Audie Box, Student NP  [facility]

## 2022-04-04 NOTE — Progress Notes (Addendum)
NAME:  [name], MRN:  [id number], DOB:  October 03, 1950, LOS: 2 ADMISSION DATE:  04/02/2022, CONSULTATION DATE: 04/02/2022 REFERRING MD: Dr. Jaclyn Shaggy, CHIEF COMPLAINT: Seizures  History of Present Illness:  71 year old female with chronic HFpEF, COPD, diabetes, seizure disorder who presented with progressively increasing shortness of breath at [facility] on 9/24, was admitted with acute COPD/acute on chronic HFpEF exacerbation, while in the ED at [facility] patient had witnessed seizures, she was given Ativan and loaded with Keppra, on further review it was noted patient missed Vimpat dose and her blood pressure went up to 160 systolic.  She was admitted under hospitalist service, she continued to have seizures, neurology was consulted, patient was started on Vimpat and Keppra, there was concern for status epilepticus versus PRES, patient was transferred to [facility] for continuous video EEG.  During my evaluation patient does have twitching of both upper eyelids, she is nonverbal at baseline, hard to know other complaints Pertinent  Medical History   Past Medical History:  Diagnosis Date   Asthma    CHF (congestive heart failure) (HCC)    Cirrhosis, non-alcoholic (HCC)    COPD (chronic obstructive pulmonary disease) ([place])    Deaf    Depression    Diabetes mellitus without complication ([place])    GERD (gastroesophageal reflux disease)    Heart murmur    Hepatitis    History of rheumatic fever    History of scarlet fever    Hypertension    IBS (irritable bowel syndrome)    Lymph node disorder    arm   Neuropathy    On home oxygen therapy    hs   Orthopnea    Osteoarthritis    RA (rheumatoid arthritis) (HCC)    RLS (restless legs syndrome)    Seizures (HCC)    Shortness of breath dyspnea    Sleep apnea    Stroke ([place])    [place] [facility] Events: Including procedures, antibiotic start and stop dates in addition to other pertinent events   9/27 admitted w/ SE.  Loaded w/ Keppra. Seizures subsided on LTM started on cleviprex for HTN. MRI obtained. NML. Neg for PRES  9/28 remains encephalopathic. LTM neg for seizure over night  9/29 had been doing well, then had witnessed episode where she suddenly became less responsive, then developed rapid blinking activity then was globally aphasic after  Interim History / Subjective:  Aphasic now Objective   Blood pressure (Abnormal) 117/52, pulse 63, temperature 98 F (36.7 C), temperature source Axillary, resp. rate (Abnormal) 21, weight 75.5 kg, SpO2 96 %.        Intake/Output Summary (Last 24 hours) at 04/04/2022 0707 Last data filed at 04/04/2022 0600 Gross per 24 hour  Intake 788.1 ml  Output 2393 ml  Net -1604.9 ml   Filed Weights   04/03/22 0400 04/04/22 0500  Weight: 76.6 kg 75.5 kg    Examination: General: 71 year old female currently lying in bed.  Not in any acute distress Neuro awake, on initial exam this morning was oriented x2, conversant, hard of hearing but able to follow all commands.  I was just called into the room after patient had an episode where she was staring off,Started to exhibit rapid eye blinking, then developed global aphasia, currently awaiting EEG read Pulmonary: Clear to auscultation Cardiac regular rate and rhythm Abdomen soft not tender Extremities warm dry  Resolved Hospital Problem list     Assessment & Plan:  Breakthrough seizure with for  status epilepticus, complicates significantly by pseudoseizure  -? [place] episode today vs pseudoseizure  Plan Cont Vimpat and keppra Seizure precautions LTM per neuro Awaiting epileptologist to read most recent EEG, we will likely need to make some adjustments to her AEDs, I will defer this to neurology Will start her Neurontin back when she can take POs or give VT  Acute metabolic encephalopathy, likely residual post-ictal state, +/- hypertensive encephalopathy  Plan Treat seizures Avoid hyperglycemia and  fever Supportive care   Hypertensive emergency : MRI negative for PRES Plan Wean Cleviprex Goal SBP <140 Start VT after placed   Acute on chronic HFpEF with pulmonary edema Plan Cont daily diuresis  Cont tele   Acute COPD exacerbation Plan Day 3/5 systemic steroids Wean oxygen  Schedule BDs   Acute urinary tract infection with Klebsiella pneumonia Plan Day 3/5 (s) CTX  Fluid and electrolyte imbalance: Hypokalemia Plan Replace and recheck   Diabetes type 2 Plan Ssi   At risk for nutritional deficit Plan Place nasogastric tube, unfortunately with her fluctuating neuro status I do not think oral diet is safe if we can't get tube may need to re-assess.   Best Practice (right click and "Reselect all SmartList Selections" daily)   Diet/type: NPO DVT prophylaxis: LMWH GI prophylaxis: N/A Lines: N/A Foley:  N/A Code Status:  full code Last date of multidisciplinary goals of care discussion [pending]    Critical care time: 88mn   Austynn Pridmore E Cheresa Siers ACNP-BC LOpdykePager # [phone] # [phone] no answer

## 2022-04-04 NOTE — Progress Notes (Signed)
Neurology Progress Note  Brief HPI: 71 y.o. female with PMHx of deafness requiring ASL interpreter, seizure disorder as well as suspected PNES, CHF, cirrhosis, COPD, depression, RLS, type 2 DM, and HTN who is well known to the neurology service after presenting with multiple breakthrough seizures over the last few years. Patient presented to [facility] ED for breakthrough seizures in the setting of missed Vimpat dosing, Klebsiella UTI, and COPD exacerbation.  Hospitalization complicated by flash pulmonary edema and hypertensive emergency with worsening acute on chronic hypoxic and hypercapnic respiratory failure.  Due to the spells of facial twitching and fluctuating mentation concerning for seizure as well as rEEG evidence of 1 electrographic seizure on 9/27 with right facial twitching, fidgeting, and moaning patient was transferred for continuous EEG monitoring.  Subjective: LTM EEG monitoring at [facility] so far negative for electrographic seizures Continues on Vimpat 200 mg twice daily and Keppra 1000 mg twice daily On assessment this morning, patient with multiple episodes of eye fluttering and staring off.  Per RN, patient had multiple of these events this morning.  Event button activated with no evidence of seizure on EEG during these events.  Exam: Vitals:   04/04/22 0645 04/04/22 0800  BP: (!) 117/52 137/66  Pulse: 63 70  Resp: (!) 21 20  Temp:  97.9 F (36.6 C)  SpO2: 96% 95%   Gen: Laying in ICU bed, in no acute distress Resp: non-labored breathing, no respiratory distress on nasal cannula Abd: soft, non-distended  Neuro: Mental Status: Asleep initially, wakes to touch. She initially says "hey" when she opens her eyes but does not answer further questions. Initially, assessment attempted without ASL interpreter with reports that the patient is able to read lips though further attempts at assessment were complete with ASL video interpreter. Patient does not attend to video screen to  answer orientation questions.  Patient does not attempt to answer any orientation questions. She does not follow commands.  There are multiple events witnessed this morning by bedside RN, PCCM, and neurology attending and NP where the patient has staring, squinting, and eye fluttering spells. Event button pressed during these events. Patient withdraws to noxious stimuli and resists passive eye opening during these events. Per epileptologist read, there is no evidence of seizure on EEG during these events. Per bedside RN and PCCM, patient was fully alert, oriented, and communicative prior to a 4 minute staring and eye twitching spell this morning.  Cranial Nerves: PERRL, actively resists passive eye opening, facial grimace is symmetric, head is grossly midline, does not protrude tongue to command Motor: Withdraws to noxious stimuli throughout without noted asymmetry. Does not follow commands, does not participate in confrontational strength testing.  Sensory: As above DTR: Sustained clonus bilaterally Gait: Deferred  Pertinent Labs: CBC    Component Value Date/Time   WBC 7.6 04/03/2022 0402   RBC 5.15 (H) 04/03/2022 0402   HGB 13.8 04/03/2022 0402   HGB 15.3 11/05/2013 1138   HCT 43.1 04/03/2022 0402   HCT 44.8 11/05/2013 1138   PLT 195 04/03/2022 0402   PLT 214 11/05/2013 1138   MCV 83.7 04/03/2022 0402   MCV 85 11/05/2013 1138   MCH 26.8 04/03/2022 0402   MCHC 32.0 04/03/2022 0402   RDW 13.7 04/03/2022 0402   RDW 13.3 11/05/2013 1138   LYMPHSABS 2.2 03/30/2022 0721   LYMPHSABS 1.4 04/24/2013 0614   MONOABS 0.6 03/30/2022 0721   MONOABS 0.2 04/24/2013 0614   EOSABS 0.1 03/30/2022 0721   EOSABS 0.0 04/24/2013 0350  BASOSABS 0.0 03/30/2022 0721   BASOSABS 0.0 04/24/2013 0614   CMP     Component Value Date/Time   NA 141 04/04/2022 0327   NA 139 11/05/2013 1138   K 3.0 (L) 04/04/2022 0327   K 3.3 (L) 11/05/2013 1138   CL 98 04/04/2022 0327   CL 103 11/05/2013 1138   CO2 31  04/04/2022 0327   CO2 29 11/05/2013 1138   GLUCOSE 77 04/04/2022 0327   GLUCOSE 219 (H) 11/05/2013 1138   BUN 17 04/04/2022 0327   BUN 9 11/05/2013 1138   CREATININE 0.74 04/04/2022 0327   CREATININE 0.35 (L) 11/05/2013 1138   CALCIUM 9.1 04/04/2022 0327   CALCIUM 8.9 11/05/2013 1138   PROT 7.6 04/02/2022 1309   PROT 7.0 11/05/2013 1138   ALBUMIN 3.8 04/02/2022 1309   ALBUMIN 3.4 11/05/2013 1138   AST 21 04/02/2022 1309   AST 48 (H) 11/05/2013 1138   ALT 14 04/02/2022 1309   ALT 35 11/05/2013 1138   ALKPHOS 79 04/02/2022 1309   ALKPHOS 145 (H) 11/05/2013 1138   BILITOT 0.8 04/02/2022 1309   BILITOT 0.3 11/05/2013 1138   GFRNONAA >60 04/04/2022 0327   GFRNONAA >60 11/05/2013 1138   GFRAA >60 12/24/2019 0514   GFRAA >60 11/05/2013 1138   Urinalysis    Component Value Date/Time   COLORURINE YELLOW (A) 03/30/2022 1402   APPEARANCEUR HAZY (A) 03/30/2022 1402   APPEARANCEUR Clear 11/14/2011 1607   LABSPEC 1.014 03/30/2022 1402   LABSPEC 1.006 11/14/2011 1607   PHURINE 5.0 03/30/2022 1402   GLUCOSEU NEGATIVE 03/30/2022 1402   GLUCOSEU Negative 11/14/2011 1607   HGBUR NEGATIVE 03/30/2022 1402   BILIRUBINUR NEGATIVE 03/30/2022 1402   BILIRUBINUR Negative 11/14/2011 1607   KETONESUR NEGATIVE 03/30/2022 1402   PROTEINUR NEGATIVE 03/30/2022 1402   NITRITE POSITIVE (A) 03/30/2022 1402   LEUKOCYTESUR NEGATIVE 03/30/2022 1402   LEUKOCYTESUR Negative 11/14/2011 1607   Drugs of Abuse     Component Value Date/Time   LABOPIA NONE DETECTED 03/31/2022 0753   COCAINSCRNUR NONE DETECTED 03/31/2022 0753   LABBENZ POSITIVE (A) 03/31/2022 0753   AMPHETMU NONE DETECTED 03/31/2022 0753   THCU NONE DETECTED 03/31/2022 0753   LABBARB NONE DETECTED 03/31/2022 0753    Results for orders placed or performed during the hospital encounter of 04/02/22  MRSA Next Gen by PCR, Nasal     Status: None   Collection Time: 04/02/22 11:20 PM   Specimen: Nasal Mucosa; Nasal Swab  Result Value Ref  Range Status   MRSA by PCR Next Gen NOT DETECTED NOT DETECTED Final    Comment: (NOTE) The GeneXpert MRSA Assay (FDA approved for NASAL specimens only), is one component of a comprehensive MRSA colonization surveillance program. It is not intended to diagnose MRSA infection nor to guide or monitor treatment for MRSA infections. Test performance is not FDA approved in patients less than 68 years old. Performed at [facility], [place] [address], [place], [place] [address]    Imaging Reviewed: MRI brain wwo 9/27: 1. No acute intracranial abnormality.  No findings of PRES. 2. Mild-to-moderate chronic small vessel ischemic disease.  Routine EEG 9/25: "This EEG was obtained while awake and drowsy and is abnormal due to mild-to-moderate diffuse slowing indicative of global cerebral dysfunction. Epileptiform abnormalities were not seen during this recording."  Routine EEG 9/27: "This EEG was obtained while awake and drowsy and is abnormal due to: - Moderate-to-severe diffuse slowing - Intermittent lateralized periodic discharges over the left hemisphere at  1 Hz - One electrographic seizure at 1807 manifesting as sharply contoured rhythmic theta with admixed sharp wave discharges with evolution most prominent over the left hemisphere that lasted approximately one minute and resolved without intervention. Clinically during this event she had right facial twitching, fidgeting, and moaning."  Overnight EEG 9/27 - 9/28: "This study is suggestive of moderate to severe diffuse encephalopathy, nonspecific etiology. No seizures or epileptiform discharges were seen throughout the recording.   Event button was pressed on 04/03/2022 at 1054 for eye flutter without concomitant EEG change. This was not an epileptic event."  Overnight EEG 9/28 - 9/29: "This study is suggestive of moderate to severe diffuse encephalopathy, nonspecific etiology. No seizures or epileptiform discharges were seen throughout  the recording.   Event button was pressed on 04/04/2022 0911 and 0930 during which patient was not responding to the nurse.  Concomitant EEG did not show any EEG changes suggest seizure.  This was not an epileptic event."  Impression: [name] is a 71 y.o. 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 She was brought in to [facility] ED for breakthrough seizures in the setting of missed vimpat doses, Klebsiella UTI, and COPD exacerbation. Hospitalization complicated by flash pulmonary edema and hypertensive emergency with worsening acute on chronic hypoxic and hypercapnic respiratory failure. She was having spells at [facility] with facial twitching and fluctuating mentation concerning for seizure. Routine EEG with no seizures, repeat rEEG at [facility] on 9/27 with 1 left hemispheric electrographic seizure with R facial twitching, fidgeting and moaning. Further workup with MRI Brain with and without contrast with no acute intracranial abnormality. No findings concerning for PRES.   She was transferred to [place] for LTM EEG which has been negative for seizures so far. Staring and blinking spells have been captured without EEG correlate suspicious for PNES.   Recommendations: - continue Keppra 1051m BID along with vimpat 2078mBID - continue LTM EEG for further spell characterization as patient is optimized from PCCM standpoint. - Recommend gradual normotension. - Seizure precautions - Ativan for seizure lasting more than 5 mins.  StAnibal HendersonAGACNP-BC [facility] [phone] NEUROHOSPITALIST ADDENDUM Performed a face to face diagnostic evaluation.   I have reviewed the contents of history and physical exam as documented by  PA/ARNP/Resident and agree with above documentation.  I have discussed and formulated the above plan as documented. Edits to the note have been made as needed.  Impression/Key exam findings/Plan: she has epileptic seizures as well as non epileptic spells. Witnessed events this AM with no EEG changes and non epileptic. Will still keep her on the EEG given that previous hospitalization, she had several events every time her EEG was removed and she had to be put back on cEEG. Will take down LTM as she is ready for discharge or if there is an urgent need for LTM for another patient.  SaDonnetta SimpersMD [facility] [phone] If 7pm to 7am, please call on call as listed on AMION.

## 2022-04-04 NOTE — Progress Notes (Signed)
vLTM Maintenance  All impedances below 10kohms.  No skin breakdown noted at all skin sites.

## 2022-04-04 NOTE — Progress Notes (Signed)
[facility] ADULT ICU REPLACEMENT PROTOCOL   The patient does apply for the [facility] Adult ICU Electrolyte Replacment Protocol based on the criteria listed below:   1.Exclusion criteria: TCTS patients, ECMO patients, and Dialysis patients 2. Is GFR >/= 30 ml/min? Yes.    Patient's GFR today is >60 3. Is SCr </= 2? Yes.   Patient's SCr is 0.74 mg/dL 4. Did SCr increase >/= 0.5 in 24 hours? No. 5.Pt's weight >40kg  Yes.   6. Abnormal electrolyte(s): K+ 3.0  7. Electrolytes replaced per protocol 8.  Call MD STAT for K+ </= 2.5, Phos </= 1, or Mag </= 1 Physician:  Randie Heinz 04/04/2022 4:27 AM

## 2022-04-04 NOTE — Progress Notes (Signed)
While having a full conversation with patient (patient Aox3), she suddenly started rapidly blinking and became globally aphasic. Consistent with her recent seizure activity during this admission. Pete NP (CCM) notified and event button pushed on EEG.  Montez Hageman RN

## 2022-04-04 NOTE — Procedures (Addendum)
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 EEG to evaluate for seizure   Level of alertness: Awake, asleep   AEDs during EEG study: LEV, LCM   Technical aspects: This EEG study was done with scalp electrodes positioned according to the 10-20 International system of electrode placement. Electrical activity was reviewed with band pass filter of 1-_0 , sensitivity of 7 uV/mm, display speed of 1m/sec with a _1  notched filter applied as appropriate. EEG data were recorded continuously and digitally stored.  Video monitoring was available and reviewed as appropriate.   Description: No clear posterior dominant rhythm was seen. Sleep was characterized by sleep spindles (12 to 14 Hz), maximal frontocentral region. EEG showed near continuous generalized 5 to 6 Hz theta slowing admixed with intermittent generalized low amplitude 2 to 3 Hz delta slowing. Hyperventilation and photic stimulation were not performed.     Event button was pressed on 04/03/2022 at 1054 for eye flutter. Concomitant eeg before, during and after the event didn't show any eeg change to suggest seizure.    ABNORMALITY - Continuous slow, generalized   IMPRESSION: This study is suggestive of moderate to severe diffuse encephalopathy, nonspecific etiology. No seizures or epileptiform discharges were seen throughout the recording.   Event button was pressed on 04/03/2022 at 1054 for eye flutter. Concomitant  EEG did not show any EEG changes suggest seizure.  This was not an epileptic event.    [name] OBarbra Sarks

## 2022-04-04 NOTE — Progress Notes (Signed)
Patient pulled out NG tube. New one placed. STAT DG abdomen ordered. Marni Griffon, NP (CCM) notified. Patient now in nonviolent wrist restraints.   Montez Hageman RN

## 2022-04-05 LAB — BASIC METABOLIC PANEL
Anion gap: 12 (ref 5–15)
BUN: 25 mg/dL — ABNORMAL HIGH (ref 8–23)
CO2: 22 mmol/L (ref 22–32)
Calcium: 9 mg/dL (ref 8.9–10.3)
Chloride: 110 mmol/L (ref 98–111)
Creatinine, Ser: 0.76 mg/dL (ref 0.44–1.00)
GFR, Estimated: >60 mL/min (ref >60)
Glucose, Bld: 188 mg/dL — ABNORMAL HIGH (ref 70–99)
Potassium: 4.4 mmol/L (ref 3.5–5.1)
Sodium: 144 mmol/L (ref 135–145)

## 2022-04-05 LAB — PHOSPHORUS
Phosphorus: 3.8 mg/dL (ref 2.5–4.6)
Phosphorus: 4.3 mg/dL (ref 2.5–4.6)

## 2022-04-05 LAB — GLUCOSE, CAPILLARY
Glucose-Capillary: 170 mg/dL — ABNORMAL HIGH (ref 70–99)
Glucose-Capillary: 213 mg/dL — ABNORMAL HIGH (ref 70–99)
Glucose-Capillary: 152 mg/dL — ABNORMAL HIGH (ref 70–99)
Glucose-Capillary: 150 mg/dL — ABNORMAL HIGH (ref 70–99)
Glucose-Capillary: 181 mg/dL — ABNORMAL HIGH (ref 70–99)

## 2022-04-05 LAB — MAGNESIUM
Magnesium: 2.1 mg/dL (ref 1.7–2.4)
Magnesium: 2 mg/dL (ref 1.7–2.4)

## 2022-04-05 MED ORDER — CITALOPRAM HYDROBROMIDE 10 MG PO TABS
10.0000 mg | ORAL_TABLET | Freq: Every day | ORAL | Status: DC
  Administered 2022-04-06 – 2022-04-07 (×2): 10 mg via ORAL
  Filled 2022-04-05 (×2): qty 1

## 2022-04-05 MED ORDER — ACETAMINOPHEN 325 MG PO TABS
650.0000 mg | ORAL_TABLET | Freq: Four times a day (QID) | ORAL | Status: DC | PRN
  Administered 2022-04-05 – 2022-04-06 (×3): 650 mg via ORAL
  Filled 2022-04-05 (×3): qty 2

## 2022-04-05 MED ORDER — CLOPIDOGREL BISULFATE 75 MG PO TABS
75.0000 mg | ORAL_TABLET | Freq: Every day | ORAL | Status: DC
  Administered 2022-04-06 – 2022-04-07 (×2): 75 mg via ORAL
  Filled 2022-04-05 (×2): qty 1

## 2022-04-05 MED ORDER — ALBUTEROL SULFATE (2.5 MG/3ML) 0.083% IN NEBU: 2.5000 mg | INHALATION_SOLUTION | RESPIRATORY_TRACT | Status: DC | PRN

## 2022-04-05 MED ORDER — POLYETHYLENE GLYCOL 3350 17 G PO PACK: 17.0000 g | PACK | Freq: Every day | ORAL | Status: DC

## 2022-04-05 MED ORDER — ACETAMINOPHEN 650 MG RE SUPP: 650.0000 mg | RECTAL | Status: DC | PRN

## 2022-04-05 MED ORDER — LOSARTAN POTASSIUM 50 MG PO TABS
100.0000 mg | ORAL_TABLET | Freq: Every day | ORAL | Status: DC
  Administered 2022-04-06 – 2022-04-07 (×2): 100 mg via ORAL
  Filled 2022-04-05 (×2): qty 2

## 2022-04-05 MED ORDER — FUROSEMIDE 40 MG PO TABS
40.0000 mg | ORAL_TABLET | Freq: Every day | ORAL | Status: DC
  Administered 2022-04-06 – 2022-04-07 (×2): 40 mg via ORAL
  Filled 2022-04-05 (×2): qty 1

## 2022-04-05 MED ORDER — POTASSIUM CHLORIDE CRYS ER 20 MEQ PO TBCR
40.0000 meq | EXTENDED_RELEASE_TABLET | Freq: Two times a day (BID) | ORAL | Status: DC
  Administered 2022-04-05 – 2022-04-06 (×2): 40 meq via ORAL
  Filled 2022-04-05 (×3): qty 2

## 2022-04-05 MED ORDER — METOPROLOL TARTRATE 12.5 MG HALF TABLET
12.5000 mg | ORAL_TABLET | Freq: Two times a day (BID) | ORAL | Status: DC
  Administered 2022-04-05 – 2022-04-06 (×3): 12.5 mg via ORAL
  Filled 2022-04-05 (×3): qty 1

## 2022-04-05 MED ORDER — DOCUSATE SODIUM 100 MG PO CAPS
100.0000 mg | ORAL_CAPSULE | Freq: Two times a day (BID) | ORAL | Status: DC
  Filled 2022-04-05: qty 1

## 2022-04-05 MED ORDER — GABAPENTIN 100 MG PO CAPS
100.0000 mg | ORAL_CAPSULE | Freq: Two times a day (BID) | ORAL | Status: DC
  Administered 2022-04-05 – 2022-04-07 (×4): 100 mg via ORAL
  Filled 2022-04-05 (×4): qty 1

## 2022-04-05 NOTE — Procedures (Signed)
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 EEG to evaluate for seizure.   Level of alertness: awake and asleep   AEDs during EEG study: LEV, LCM   Technical aspects: This EEG study was done with scalp electrodes positioned according to the 10-20 International system of electrode placement. Electrical activity was reviewed with band pass filter of 1-_0 , sensitivity of 7 uV/mm, display speed of 48m/sec with a _1  notched filter applied as appropriate. EEG data were recorded continuously and digitally stored.  Video monitoring was available and reviewed as appropriate.   Description: No clear posterior dominant rhythm was seen. Sleep was characterized by sleep spindles (12 to 14 Hz), maximal frontocentral region. EEG showed near continuous generalized 5 to 6 Hz theta slowing admixed with intermittent generalized low amplitude 2 to 3 Hz delta slowing. Hyperventilation and photic stimulation were not performed.      Event button was pressed 3 times for episodes of staring and not responding to staff (04/04/22 at 0911, 0930, and 2014). Concomitant eeg before, during and after each event didn't show any EEG change to suggest seizure.   ABNORMALITY -- continuous generalized slowing   IMPRESSION: This study is suggestive of moderate to severe diffuse encephalopathy, nonspecific etiology. No seizures or epileptiform discharges were seen throughout the  recording.   Event button was pressed 3 times for episodes of staring and not responding to staff (04/04/22 at 0911, 0930, and 2014). Concomitant eeg before, during and after each event didn't show any EEG change to suggest seizure. These were not epileptic events.  CSu Monks MD [facility] [phone] If 7pm- 7am, please page neurology on call [place]

## 2022-04-05 NOTE — Evaluation (Signed)
Clinical/Bedside Swallow Evaluation Patient Details  Name: [name] MRN: [id number] Date of Birth: 07/09/1950  Today's Date: 04/05/2022 Time: SLP Start Time (ACUTE ONLY): 1016 SLP Stop Time (ACUTE ONLY): 1028 SLP Time Calculation (min) (ACUTE ONLY): 12 min  Past Medical History:  Past Medical History:  Diagnosis Date   Asthma    CHF (congestive heart failure) (HCC)    Cirrhosis, non-alcoholic (HCC)    COPD (chronic obstructive pulmonary disease) ([place])    Deaf    Depression    Diabetes mellitus without complication ([place])    GERD (gastroesophageal reflux disease)    Heart murmur    Hepatitis    History of rheumatic fever    History of scarlet fever    Hypertension    IBS (irritable bowel syndrome)    Lymph node disorder    arm   Neuropathy    On home oxygen therapy    hs   Orthopnea    Osteoarthritis    RA (rheumatoid arthritis) (HCC)    RLS (restless legs syndrome)    Seizures (HCC)    Shortness of breath dyspnea    Sleep apnea    Stroke ([place])    tia   Past Surgical History:  Past Surgical History:  Procedure Laterality Date   ABDOMINAL HYSTERECTOMY     BREAST BIOPSY Right 10/30/2020   Stereo Bx, X-clip,  BENIGN BREAST TISSUE WITH FIBROADENOMATOUS   CATARACT EXTRACTION W/PHACO Right 11/23/2014   Procedure: CATARACT EXTRACTION PHACO AND INTRAOCULAR LENS PLACEMENT ([place]);  Surgeon: Lyla Glassing, MD;  Location: [facility] ORS;  Service: Ophthalmology;  Laterality: Right;   CATARACT EXTRACTION W/PHACO Left 12/14/2014   Procedure: CATARACT EXTRACTION PHACO AND INTRAOCULAR LENS PLACEMENT ([facility]);  Surgeon: Lyla Glassing, MD;  Location: [facility] ORS;  Service: Ophthalmology;  Laterality: Left;  US:01:16.6 AP:15.8 CDE:12.14   CESAREAN SECTION     CHOLECYSTECTOMY     COLONOSCOPY WITH PROPOFOL N/A 10/08/2020   Procedure: COLONOSCOPY WITH PROPOFOL;  Surgeon: Toledo, Benay Pike, MD;  Location: [facility] ENDOSCOPY;  Service: Gastroenterology;  Laterality: N/A;    ESOPHAGOGASTRODUODENOSCOPY (EGD) WITH PROPOFOL N/A 10/08/2020   Procedure: ESOPHAGOGASTRODUODENOSCOPY (EGD) WITH PROPOFOL;  Surgeon: Toledo, Benay Pike, MD;  Location: [facility] ENDOSCOPY;  Service: Gastroenterology;  Laterality: N/A;  DM DEAF, NEEDS SIGN INTERPRETER PER SON   REVERSE SHOULDER ARTHROPLASTY Right 12/21/2019   Procedure: REVERSE SHOULDER ARTHROPLASTY;  Surgeon: Lovell Sheehan, MD;  Location: [facility] ORS;  Service: Orthopedics;  Laterality: Right;   THUMB ARTHROSCOPY     TONSILLECTOMY     TYMPANOPLASTY     muliple   HPI:  71 year old female while in the ED at [facility] patient had witnessed seizures, she was given Ativan and loaded with Keppra, on further review it was noted patient missed Vimpat dose and her blood pressure went up to 546 systolic.  She was admitted under hospitalist service, she continued to have seizures, neurology was consulted, patient was started on Vimpat and Keppra. Continues with possible breakthrough seizures v pseudoseizure activity.  MRI negative for PRES with no acute findings.  CXR 9/27 with no acute findings.  Pt with chronic HFpEF, COPD, diabetes, seizure disorder who presented with progressively increasing shortness of breath at [facility] on 9/24, was admitted with acute COPD/acute on chronic HFpEF exacerbation.    Assessment / Plan / Recommendation  Clinical Impression  Pt presents with clinical indicators of pharyngeal dysphagia.  With thin liquid by cup there was immediate cough.  Pt endorsed feeling of water going down the wrong way,  but said that coughing made it go away.  With nectar thick liquid by cup and straw there were no clinical s/s of aspiration.  Pt with slight head tremor.  She voices that this bother her and would like for her head to stop bobbing up and down.  Because of this, it easier for her to drink from straw than from edge of cup.  In 2022 pt had MBS with recommendation for thin liquid by cup only 2/2 aspiration with straw sips.  Nectar thick  modification should reduce risk of aspiration with straw sips.  Pt exhibited good oral clearance of solids.  There was prolonged by adequate oral phase with regular solid graham cracker. Pt would benefit from MBS to further evaluate pharyngeal swallow function.  Will plan for instrumental evaluation early next week.    Recommend regular texture diet with nectar thick liquids.  SLP Visit Diagnosis: Dysphagia, unspecified (R13.10)    Aspiration Risk  Mild aspiration risk    Diet Recommendation Regular;Nectar-thick liquid   Liquid Administration via: Straw;Cup Medication Administration: Whole meds with liquid (As tolerated) Supervision: Staff to assist with self feeding Compensations: Slow rate;Small sips/bites Postural Changes: Seated upright at 90 degrees    Other  Recommendations Oral Care Recommendations: Oral care BID    Recommendations for follow up therapy are one component of a multi-disciplinary discharge planning process, led by the attending physician.  Recommendations may be updated based on patient status, additional functional criteria and insurance authorization.  Follow up Recommendations  (Pending results of MBS)      Assistance Recommended at Discharge  (TBD)  Functional Status Assessment Patient has had a recent decline in their functional status and demonstrates the ability to make significant improvements in function in a reasonable and predictable amount of time.  Frequency and Duration  (Pending results of MBSS)          Prognosis Prognosis for Safe Diet Advancement:  (Pending results of MBSS)      Swallow Study   General Date of Onset: 03/30/22 HPI: 71 year old female while in the ED at [facility] patient had witnessed seizures, she was given Ativan and loaded with Keppra, on further review it was noted patient missed Vimpat dose and her blood pressure went up to 944 systolic.  She was admitted under hospitalist service, she continued to have seizures, neurology  was consulted, patient was started on Vimpat and Keppra. Continues with possible breakthrough seizures v pseudoseizure activity.  MRI negative for PRES with no acute findings.  CXR 9/27 with no acute findings.  Pt with chronic HFpEF, COPD, diabetes, seizure disorder who presented with progressively increasing shortness of breath at [facility] on 9/24, was admitted with acute COPD/acute on chronic HFpEF exacerbation. Type of Study: Bedside Swallow Evaluation Diet Prior to this Study: Regular;Thin liquids Temperature Spikes Noted: No History of Recent Intubation: No Behavior/Cognition: Alert;Cooperative;Pleasant mood Oral Cavity Assessment: Dry Oral Care Completed by SLP: No Oral Cavity - Dentition: Adequate natural dentition Vision: Functional for self-feeding Self-Feeding Abilities: Able to feed self Patient Positioning: Upright in bed Baseline Vocal Quality: Normal Volitional Cough: Strong Volitional Swallow: Able to elicit    Oral/Motor/Sensory Function Overall Oral Motor/Sensory Function: Mild impairment (head tremor) Facial ROM: Within Functional Limits Facial Symmetry: Within Functional Limits Lingual ROM: Within Functional Limits Lingual Symmetry: Within Functional Limits Lingual Strength: Reduced Velum: Within Functional Limits Mandible: Within Functional Limits   Ice Chips Ice chips: Not tested   Thin Liquid Thin Liquid: Impaired Pharyngeal  Phase Impairments: Cough -  Immediate    Nectar Thick Nectar Thick Liquid: Within functional limits Presentation: Straw;Cup   Honey Thick Honey Thick Liquid: Not tested   Puree Puree: Within functional limits Presentation: Spoon   Solid     Solid: Impaired Presentation: Self Fed Oral Phase Functional Implications: Prolonged oral transit      Celedonio Savage, MA, [place] Office: [phone] 04/05/2022,10:46 AM

## 2022-04-05 NOTE — Progress Notes (Signed)
LTM eeg maint complete - no skin breakdown under:  FP1 FP2 A1 continue to monitor

## 2022-04-05 NOTE — Progress Notes (Signed)
NAME:  [name], MRN:  [id number], DOB:  1951/03/11, LOS: 3 ADMISSION DATE:  04/02/2022, CONSULTATION DATE: 04/02/2022 REFERRING MD: Dr. Jaclyn Shaggy, CHIEF COMPLAINT: Seizures  History of Present Illness:  71 year old female with chronic HFpEF, COPD, diabetes, seizure disorder who presented with progressively increasing shortness of breath at [facility] on 9/24, was admitted with acute COPD/acute on chronic HFpEF exacerbation, while in the ED at [facility] patient had witnessed seizures, she was given Ativan and loaded with Keppra, on further review it was noted patient missed Vimpat dose and her blood pressure went up to 132 systolic.  She was admitted under hospitalist service, she continued to have seizures, neurology was consulted, patient was started on Vimpat and Keppra, there was concern for status epilepticus versus PRES, patient was transferred to [facility] for continuous video EEG.  During my evaluation patient does have twitching of both upper eyelids, she is nonverbal at baseline, hard to know other complaints Pertinent  Medical History   Past Medical History:  Diagnosis Date   Asthma    CHF (congestive heart failure) (HCC)    Cirrhosis, non-alcoholic (HCC)    COPD (chronic obstructive pulmonary disease) ([place])    Deaf    Depression    Diabetes mellitus without complication ([place])    GERD (gastroesophageal reflux disease)    Heart murmur    Hepatitis    History of rheumatic fever    History of scarlet fever    Hypertension    IBS (irritable bowel syndrome)    Lymph node disorder    arm   Neuropathy    On home oxygen therapy    hs   Orthopnea    Osteoarthritis    RA (rheumatoid arthritis) (HCC)    RLS (restless legs syndrome)    Seizures (HCC)    Shortness of breath dyspnea    Sleep apnea    Stroke ([place])    [place] [facility] Events: Including procedures, antibiotic start and stop dates in addition to other pertinent events   9/27 admitted w/ SE.  Loaded w/ Keppra. Seizures subsided on LTM started on cleviprex for HTN. MRI obtained. NML. Neg for PRES  9/28 remains encephalopathic. LTM neg for seizure over night  9/29 had been doing well, then had witnessed episode where she suddenly became less responsive, then developed rapid blinking activity then was globally aphasic after  Interim History / Subjective:  She pulled out her NG tube on 9/29, was replaced EEG has not shown epileptiform activity when event button pushed  Objective   Blood pressure (!) 130/56, pulse 63, temperature 98.5 F (36.9 C), temperature source Oral, resp. rate (!) 22, weight 75.5 kg, SpO2 91 %.        Intake/Output Summary (Last 24 hours) at 04/05/2022 0848 Last data filed at 04/05/2022 0600 Gross per 24 hour  Intake 933.86 ml  Output 1610 ml  Net -676.14 ml   Filed Weights   04/03/22 0400 04/04/22 0500  Weight: 76.6 kg 75.5 kg    Examination: General: elderly woman, NAD, continuous EEG in place Neuro: Awake, alert, much more interactive than 9/29.  Decreased hearing but she is reading lips.  Moves upper extremities.  Strong cough Pulmonary: Clear bilaterally, no wheezes Cardiac regular, no murmur Abdomen nondistended, positive bowel sounds Extremities no edema  Resolved Hospital Problem list     Assessment & Plan:  Breakthrough seizure with for status epilepticus, complicates significantly by pseudoseizure  -? [place] episode today vs pseudoseizure  Plan -Seizure precautions -Keppra, Vimpat -Neurontin restarted 9/29 -Continuous EEG monitoring -Appreciate neurology and epileptology assistance here.  Very complicated to discern seizure activity versus pseudoseizure activity  Acute metabolic encephalopathy, likely residual post-ictal state, +/- hypertensive encephalopathy  Plan -Seizure management as above -Supportive care -Push blood pressure control, temperature control  Hypertensive emergency : MRI negative for PRES Plan -Off  Cleviprex -Goal SBP <140 -Lasix per tube -Losartan  Acute on chronic HFpEF with pulmonary edema Plan -Daily diuresis, Lasix as ordered -Telemetry monitoring  Acute COPD exacerbation Plan -No wheezing, plan to DC systemic steroids today -Wean FiO2 -Albuterol ordered as needed  Acute urinary tract infection with Klebsiella pneumonia Plan -Plan 5-day ceftriaxone  Fluid and electrolyte imbalance: Hypokalemia Plan -Following BMP -Replace as indicated  Diabetes type 2 Plan -SSI  At risk for nutritional deficit Plan -NG tube in place.  Will retry p.o. intake today 9/30  Disposition -Should be able to transition out of ICU today 9/30.  Neurology planning to leave her on continuous EEG monitoring  Best Practice (right click and "Reselect all SmartList Selections" daily)   Diet/type: NPO DVT prophylaxis: LMWH GI prophylaxis: N/A Lines: N/A Foley:  N/A Code Status:  full code Last date of multidisciplinary goals of care discussion [pending]    Critical care time: N/A     Baltazar Apo, MD, PhD 04/05/2022, 8:58 AM [facility] [phone] or if no answer before 7:00PM call [phone] For any issues after 7:00PM please call eLink [phone]

## 2022-04-06 DIAGNOSIS — R569 Unspecified convulsions: Secondary | ICD-10-CM

## 2022-04-06 LAB — BASIC METABOLIC PANEL WITH GFR
Anion gap: 7 (ref 5–15)
BUN: 19 mg/dL (ref 8–23)
CO2: 25 mmol/L (ref 22–32)
Calcium: 9.5 mg/dL (ref 8.9–10.3)
Chloride: 109 mmol/L (ref 98–111)
Creatinine, Ser: 0.61 mg/dL (ref 0.44–1.00)
GFR, Estimated: >60 mL/min
Glucose, Bld: 91 mg/dL (ref 70–99)
Potassium: 4.2 mmol/L (ref 3.5–5.1)
Sodium: 141 mmol/L (ref 135–145)

## 2022-04-06 LAB — CBC
HCT: 45 % (ref 36.0–46.0)
Hemoglobin: 14.3 g/dL (ref 12.0–15.0)
MCH: 26.9 pg (ref 26.0–34.0)
MCHC: 31.8 g/dL (ref 30.0–36.0)
MCV: 84.6 fL (ref 80.0–100.0)
Platelets: 204 K/uL (ref 150–400)
RBC: 5.32 MIL/uL — ABNORMAL HIGH (ref 3.87–5.11)
RDW: 13.7 % (ref 11.5–15.5)
WBC: 8.1 K/uL (ref 4.0–10.5)
nRBC: 0 % (ref 0.0–0.2)

## 2022-04-06 LAB — MAGNESIUM: Magnesium: 1.9 mg/dL (ref 1.7–2.4)

## 2022-04-06 LAB — GLUCOSE, CAPILLARY
Glucose-Capillary: 185 mg/dL — ABNORMAL HIGH (ref 70–99)
Glucose-Capillary: 237 mg/dL — ABNORMAL HIGH (ref 70–99)
Glucose-Capillary: 80 mg/dL (ref 70–99)
Glucose-Capillary: 95 mg/dL (ref 70–99)
Glucose-Capillary: 99 mg/dL (ref 70–99)

## 2022-04-06 LAB — PHOSPHORUS: Phosphorus: 4.3 mg/dL (ref 2.5–4.6)

## 2022-04-06 MED ORDER — INSULIN ASPART 100 UNIT/ML IJ SOLN
0.0000 [IU] | Freq: Three times a day (TID) | INTRAMUSCULAR | Status: DC
  Administered 2022-04-06: 3 [IU] via SUBCUTANEOUS
  Administered 2022-04-06: 5 [IU] via SUBCUTANEOUS
  Administered 2022-04-07 (×2): 3 [IU] via SUBCUTANEOUS

## 2022-04-06 MED ORDER — INSULIN ASPART 100 UNIT/ML IJ SOLN: 0.0000 [IU] | Freq: Every day | INTRAMUSCULAR | Status: DC

## 2022-04-06 NOTE — Progress Notes (Signed)
Fixed Fz and 01.  All under 10

## 2022-04-06 NOTE — Procedures (Signed)
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 EEG to evaluate for seizure.   Level of alertness: awake and asleep   AEDs during EEG study: LEV, LCM   Technical aspects: This EEG study was done with scalp electrodes positioned according to the 10-20 International system of electrode placement. Electrical activity was reviewed with band pass filter of 1-70Hz, sensitivity of 7 uV/mm, display speed of 53m/sec with a 60Hz notched filter applied as appropriate. EEG data were recorded continuously and digitally stored.  Video monitoring was available and reviewed as appropriate.   Description: No clear posterior dominant rhythm was seen. Sleep was characterized by sleep spindles (12 to 14 Hz), maximal frontocentral region. EEG showed near continuous generalized 5 to 6 Hz theta slowing admixed with intermittent generalized low amplitude 2 to 3 Hz delta slowing. Hyperventilation and photic stimulation were not performed.      Patient had one clinical event on 9/30 at 0948 of staring and not responding to staff. Concomitant eeg before, during and after each event didn't show any EEG change to suggest seizure.   ABNORMALITY -- continuous generalized slowing   IMPRESSION: This study is suggestive of moderate to severe diffuse encephalopathy, nonspecific etiology. No seizures or epileptiform discharges were seen throughout the recording.   Patient had one clinical event on 9/30 at 0948 of staring and  not responding to staff. Concomitant eeg before, during and after each event didn't show any EEG change to suggest seizure.  CSu Monks MD [facility] [phone] If 7pm- 7am, please page neurology on call as listed in [place]

## 2022-04-06 NOTE — Evaluation (Signed)
Physical Therapy Evaluation Patient Details Name: [name] MRN: [id number] DOB: [birth year] Today's Date: 04/06/2022  History of Present Illness  The pt is a 70 yo female presenting to [facility] on 9/24 with c/o SOB. Found to have acute on chronic COPD exacerbation with increased OT2 needs. Hospital course complicated by onset of seizure 9/25 and pt transferred to [facility] for monitoring. PMH includes: asthma, HTN, TIA, CHF, COPD, DM II, seizures, tobacco use, and cirrhosis.   Clinical Impression  Pt in bed upon arrival of PT, agreeable to evaluation at this time. Prior to admission the pt was ambulating with use of cane or RW in the home, but reports no recent falls and is independent with ADLs while her son is away at work during the day. The pt was able to complete bed mobility and sit-stand transfers with minA but needed UE support and up to modA to correct LOB with hallway ambulation at this time. Will benefit from continued skilled PT acutely to progress dynamic stability and independence with transfers to allow for progression towards safe d/c home.     Recommendations for follow up therapy are one component of a multi-disciplinary discharge planning process, led by the attending physician.  Recommendations may be updated based on patient status, additional functional criteria and insurance authorization.  Follow Up Recommendations Home health PT      Assistance Recommended at Discharge Intermittent Supervision/Assistance  Patient can return home with the following  A little help with walking and/or transfers;A little help with bathing/dressing/bathroom;Assistance with cooking/housework;Direct supervision/assist for medications management;Direct supervision/assist for financial management;Assist for transportation;Help with stairs or ramp for entrance    Equipment Recommendations None recommended by PT  Recommendations for Other Services       Functional Status Assessment  Patient has had a recent decline in their functional status and demonstrates the ability to make significant improvements in function in a reasonable and predictable amount of time.     Precautions / Restrictions Precautions Precautions: Fall Precaution Comments: seizure, on continuous EEG Restrictions Weight Bearing Restrictions: No      Mobility  Bed Mobility Overal bed mobility: Needs Assistance Bed Mobility: Supine to Sit, Sit to Supine     Supine to sit: Min assist Sit to supine: Min assist   General bed mobility comments: minA to complete movements, heay use of bed rail, increased time.    Transfers Overall transfer level: Needs assistance Equipment used: 1 person hand held assist Transfers: Sit to/from Stand Sit to Stand: Min assist           General transfer comment: minA to rise to standing. then reaching for UE support from [facility] or IV pole.    Ambulation/Gait Ambulation/Gait assistance: Min assist, Mod assist Gait Distance (Feet): 150 Feet (with x4 standing rest breaks) Assistive device: IV Pole Gait Pattern/deviations: Step-to pattern, Decreased stride length, Scissoring, Narrow base of support, Staggering right Gait velocity: decreased Gait velocity interpretation: <1.31 ft/sec, indicative of household ambulator   General Gait Details: multiple episodes of scissoring steps with x3 LOB to R with fatigue. will benefit from use of RW but pt asked to attempt without DME on first try. poor endurance wtih RR to max 31, spO2 remained in 90s on 2L     Balance Overall balance assessment: Needs assistance Sitting-balance support: No upper extremity supported, Feet supported Sitting balance-Leahy Scale: Good     Standing balance support: Single extremity supported, During functional activity Standing balance-Leahy Scale: Poor Standing balance comment: multiple LOB needing  up to modA to correct                             Pertinent Vitals/Pain  Pain Assessment Pain Assessment: No/denies pain    Home Living Family/patient expects to be discharged to:: Private residence Living Arrangements: Children Available Help at Discharge: Family;Available PRN/intermittently (son works during the day) Type of Home: Apartment Home Access: Level entry       Home Layout: One level [place]: Advice worker (2 wheels);Cane - single point Additional Comments: Pt lives with son & daughter in law who both work during the day (son: 7A-7P, daughter in Sports coach 2P-3A)    Prior Function Prior Level of Function : Independent/Modified Independent             Mobility Comments: independent in the home with use of RW or cane, uses 2L O2 only at night ADLs Comments: reports she does not cook but is able to heat up meals while son away.     Hand Dominance   Dominant Hand: Right    Extremity/Trunk Assessment   Upper Extremity Assessment Upper Extremity Assessment: Defer to OT evaluation    Lower Extremity Assessment Lower Extremity Assessment: Generalized weakness (functional against gravity but poor endurance and power. no focal deficits or changes in sensation)    Cervical / Trunk Assessment Cervical / Trunk Assessment: Normal  Communication   Communication: Deaf (uses ASL but can read lips)  Cognition Arousal/Alertness: Awake/alert Behavior During Therapy: WFL for tasks assessed/performed Overall Cognitive Status: Within Functional Limits for tasks assessed                                          General Comments General comments (skin integrity, edema, etc.): VSS on 2L        Assessment/Plan    PT Assessment Patient needs continued PT services  PT Problem List Decreased strength;Decreased activity tolerance;Decreased balance;Decreased mobility       PT Treatment Interventions Gait training;Stair training;DME instruction;Functional mobility training;Therapeutic exercise;Therapeutic  activities;Balance training;Neuromuscular re-education;Patient/family education    PT Goals (Current goals can be found in the Care Plan section)  Acute Rehab PT Goals Patient Stated Goal: return home PT Goal Formulation: With patient Time For Goal Achievement: 04/20/22 Potential to Achieve Goals: Good    Frequency Min 3X/week        AM-PAC PT "6 Clicks" Mobility  Outcome Measure Help needed turning from your back to your side while in a flat bed without using bedrails?: A Little Help needed moving from lying on your back to sitting on the side of a flat bed without using bedrails?: A Little Help needed moving to and from a bed to a chair (including a wheelchair)?: A Little Help needed standing up from a chair using your arms (e.g., wheelchair or bedside chair)?: A Little Help needed to walk in hospital room?: A Little Help needed climbing 3-5 steps with a railing? : A Little 6 Click Score: 18    End of Session Equipment Utilized During Treatment: Gait belt;Oxygen Activity Tolerance: Patient tolerated treatment well;Patient limited by fatigue Patient left: in bed;with call bell/phone within reach;with nursing/sitter in room Nurse Communication: Mobility status PT Visit Diagnosis: Other abnormalities of gait and mobility (R26.89);Muscle weakness (generalized) (M62.81)    Time: 7829-5621 PT Time Calculation (min) (ACUTE ONLY): 25 min  Charges:   PT Evaluation $PT Eval Moderate Complexity: 1 Mod PT Treatments $Gait Training: 8-22 mins        West Carbo, PT, DPT   Acute Rehabilitation Department  Sandra Cockayne 04/06/2022, 5:26 PM

## 2022-04-06 NOTE — Progress Notes (Signed)
[name]  KAJ:[id number] DOB: 05/14/1951 DOA: 04/02/2022 PCP: [facility] [place]    Brief Narrative:  71 year old with a history of diastolic CHF, COPD, deafness, DM 2, and seizure disorder who presented to [facility] 9/24 with progressively worsening shortness of breath and was ultimately admitted with a COPD exacerbation complicated by diastolic CHF exacerbation.  While in the ED at [facility] the patient had witnessed seizures and was given Ativan and a Keppra load.  While an inpatient in [facility] she had recurring seizures.  Neurology placed her on Vimpat and Keppra but with concern for status epilepticus she was transferred to [facility] for continuous video EEG.  Consultants:  PCCM Neurology  Goals of Care:  Code Status: Full Code   DVT prophylaxis: Lovenox  Interim Hx: Patient was cleared for regular diet with nectar thick liquids by SLP eval yesterday.  Afebrile.  Vital signs stable/blood pressure controlled.  Alert and interactive at the time of my visit.  Quite pleasant.  Tells me she is hoping to go home soon.  Denies chest pain shortness of breath fevers or chills.  Assessment & Plan:  Seizure disorder with breakthrough seizures versus pseudoseizures Continue seizure precautions -being dosed with Keppra and Vimpat and Neurontin -continuous EEG monitoring last night noted no evidence of seizure activity despite activity button being pressed 3 times during episodes of staring/unresponsiveness  Acute metabolic encephalopathy due to hypertension as well as seizures Supportive care - control blood pressure -appears much improved at this time  Hypertensive emergency MRI negative for PRES -required Cleviprex transiently during hospital stay -blood pressure now better controlled  Acute exacerbation of chronic diastolic CHF Has been diuresed since admission -appears euvolemic at time of exam today  Acute Klebsiella pneumonia UTI POA To complete 5 days of  ceftriaxone  Hypokalemia Corrected with supplementation -magnesium is normal  DM2 CBG well controlled -A1c 6.2  Family Communication: No family present at time of exam Disposition: Await clearance from Neurology to begin PT/OT   Objective: Blood pressure (!) 141/95, pulse (!) 59, temperature 98.1 F (36.7 C), resp. rate 19, weight 75.5 kg, SpO2 91 %.  Intake/Output Summary (Last 24 hours) at 04/06/2022 0837 Last data filed at 04/06/2022 0600 Gross per 24 hour  Intake 1239.8 ml  Output 1750 ml  Net -510.2 ml   Filed Weights   04/03/22 0400 04/04/22 0500  Weight: 76.6 kg 75.5 kg    Examination: General: No acute respiratory distress Lungs: Clear to auscultation bilaterally without wheezes or crackles Cardiovascular: Regular rate and rhythm without murmur gallop or rub normal S1 and S2 Abdomen: Nontender, nondistended, soft, bowel sounds positive, no rebound, no ascites, no appreciable mass Extremities: No significant cyanosis, clubbing, or edema bilateral lower extremities  CBC: Recent Labs  Lab 04/02/22 1309 04/03/22 0402 04/06/22 0558  WBC 8.9 7.6 8.1  HGB 14.3 13.8 14.3  HCT 45.0 43.1 45.0  MCV 83.3 83.7 84.6  PLT 220 195 035   Basic Metabolic Panel: Recent Labs  Lab 04/04/22 1644 04/05/22 0544 04/05/22 1756 04/06/22 0558  NA 140 144  --  141  K 5.1 4.4  --  4.2  CL 104 110  --  109  CO2 26 22  --  25  GLUCOSE 207* 188*  --  91  BUN 23 25*  --  19  CREATININE 0.79 0.76  --  0.61  CALCIUM 8.7* 9.0  --  9.5  MG 1.9 2.1 2.0 1.9  PHOS 3.0 3.8 4.3 4.3  GFR: Estimated Creatinine Clearance: 63.6 mL/min (by C-G formula based on SCr of 0.61 mg/dL).   Scheduled Meds:  Chlorhexidine Gluconate Cloth  6 each Topical Daily   citalopram  10 mg Oral Daily   clopidogrel  75 mg Oral Daily   docusate sodium  100 mg Oral BID   enoxaparin (LOVENOX) injection  40 mg Subcutaneous Q24H   feeding supplement (PROSource TF20)  60 mL Per Tube Daily   furosemide  40 mg  Oral Daily   gabapentin  100 mg Oral BID   insulin aspart  0-15 Units Subcutaneous Q4H   lidocaine  1 Application Topical Once   losartan  100 mg Oral Daily   metoprolol tartrate  12.5 mg Oral BID   mouth rinse  15 mL Mouth Rinse 4 times per day   polyethylene glycol  17 g Oral Daily   potassium chloride  40 mEq Oral BID   Continuous Infusions:  cefTRIAXone (ROCEPHIN)  IV Stopped (04/05/22 1646)   feeding supplement (JEVITY 1.5 CAL/FIBER) 1,000 mL (04/04/22 1633)   lacosamide (VIMPAT) IV Stopped (04/05/22 2321)   levETIRAcetam Stopped (04/06/22 0036)     LOS: 4 days   Cherene Altes, MD [facility] Office  [phone] Pager - Text Page per Shea Evans  If 7PM-7AM, please contact night-coverage per Amion 04/06/2022, 8:37 AM

## 2022-04-07 ENCOUNTER — Inpatient Hospital Stay (HOSPITAL_COMMUNITY): Payer: Medicare Other

## 2022-04-07 ENCOUNTER — Other Ambulatory Visit (HOSPITAL_COMMUNITY): Payer: Self-pay

## 2022-04-07 DIAGNOSIS — J449 Chronic obstructive pulmonary disease, unspecified: Secondary | ICD-10-CM

## 2022-04-07 DIAGNOSIS — F32A Depression, unspecified: Secondary | ICD-10-CM

## 2022-04-07 DIAGNOSIS — I161 Hypertensive emergency: Secondary | ICD-10-CM

## 2022-04-07 DIAGNOSIS — E119 Type 2 diabetes mellitus without complications: Secondary | ICD-10-CM

## 2022-04-07 DIAGNOSIS — I10 Essential (primary) hypertension: Secondary | ICD-10-CM

## 2022-04-07 DIAGNOSIS — G4089 Other seizures: Secondary | ICD-10-CM

## 2022-04-07 DIAGNOSIS — K7460 Unspecified cirrhosis of liver: Secondary | ICD-10-CM

## 2022-04-07 DIAGNOSIS — I5020 Unspecified systolic (congestive) heart failure: Secondary | ICD-10-CM

## 2022-04-07 LAB — GLUCOSE, CAPILLARY
Glucose-Capillary: 156 mg/dL — ABNORMAL HIGH (ref 70–99)
Glucose-Capillary: 178 mg/dL — ABNORMAL HIGH (ref 70–99)

## 2022-04-07 MED ORDER — SPIRONOLACTONE 25 MG PO TABS: 25.0000 mg | ORAL_TABLET | Freq: Every day | ORAL | Status: DC

## 2022-04-07 MED ORDER — ACETAMINOPHEN 325 MG PO TABS: 650.0000 mg | ORAL_TABLET | Freq: Four times a day (QID) | ORAL | Status: DC | PRN

## 2022-04-07 MED ORDER — LACOSAMIDE 200 MG PO TABS
200.0000 mg | ORAL_TABLET | Freq: Two times a day (BID) | ORAL | 2 refills | Status: DC
  Filled 2022-04-07: qty 60, 30d supply, fill #0

## 2022-04-07 MED ORDER — FUROSEMIDE 40 MG PO TABS: 40.0000 mg | ORAL_TABLET | Freq: Every day | ORAL | Status: DC

## 2022-04-07 MED ORDER — LEVETIRACETAM 1000 MG PO TABS
1000.0000 mg | ORAL_TABLET | Freq: Two times a day (BID) | ORAL | 2 refills | Status: DC
  Filled 2022-04-07: qty 60, 30d supply, fill #0

## 2022-04-07 MED ORDER — METOPROLOL TARTRATE 25 MG PO TABS
25.0000 mg | ORAL_TABLET | Freq: Two times a day (BID) | ORAL | Status: DC
  Administered 2022-04-07: 25 mg via ORAL
  Filled 2022-04-07: qty 1

## 2022-04-07 MED ORDER — MONTELUKAST SODIUM 10 MG PO TABS: 10.0000 mg | ORAL_TABLET | Freq: Every day | ORAL | Status: DC

## 2022-04-07 MED ORDER — ROSUVASTATIN CALCIUM 10 MG PO TABS: 10.0000 mg | ORAL_TABLET | Freq: Every day | ORAL | Status: DC

## 2022-04-07 MED ORDER — PANTOPRAZOLE SODIUM 40 MG PO PACK: 40.0000 mg | PACK | Freq: Every day | ORAL | Status: DC

## 2022-04-07 MED ORDER — POTASSIUM CHLORIDE CRYS ER 20 MEQ PO TBCR
40.0000 meq | EXTENDED_RELEASE_TABLET | Freq: Every day | ORAL | Status: DC
  Administered 2022-04-07: 40 meq via ORAL
  Filled 2022-04-07: qty 2

## 2022-04-07 MED ORDER — CLOPIDOGREL BISULFATE 75 MG PO TABS: 75.0000 mg | ORAL_TABLET | Freq: Every day | ORAL | Status: DC

## 2022-04-07 MED ORDER — LOSARTAN POTASSIUM 100 MG PO TABS: 100.0000 mg | ORAL_TABLET | Freq: Every day | ORAL | Status: DC

## 2022-04-07 MED ORDER — SODIUM CHLORIDE 0.9 % IV SOLN
200.0000 mg | Freq: Two times a day (BID) | INTRAVENOUS | Status: DC
  Filled 2022-04-07 (×2): qty 20

## 2022-04-07 MED ORDER — BETHANECHOL CHLORIDE 10 MG PO TABS
10.0000 mg | ORAL_TABLET | Freq: Once | ORAL | Status: AC
  Administered 2022-04-07: 10 mg via ORAL
  Filled 2022-04-07: qty 1

## 2022-04-07 MED ORDER — LEVETIRACETAM IN NACL 1000 MG/100ML IV SOLN: 1000.0000 mg | Freq: Two times a day (BID) | INTRAVENOUS | Status: DC

## 2022-04-07 MED ORDER — LACOSAMIDE 200 MG PO TABS
200.0000 mg | ORAL_TABLET | Freq: Two times a day (BID) | ORAL | Status: DC
  Administered 2022-04-07: 200 mg via ORAL
  Filled 2022-04-07: qty 1

## 2022-04-07 MED ORDER — CITALOPRAM HYDROBROMIDE 10 MG PO TABS: 10.0000 mg | ORAL_TABLET | Freq: Every day | ORAL | Status: DC

## 2022-04-07 MED ORDER — LEVETIRACETAM 500 MG PO TABS
1000.0000 mg | ORAL_TABLET | Freq: Two times a day (BID) | ORAL | Status: DC
  Administered 2022-04-07: 1000 mg via ORAL
  Filled 2022-04-07: qty 2

## 2022-04-07 NOTE — Procedures (Addendum)
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 EEG to evaluate for seizure   Level of alertness: Awake, asleep   AEDs during EEG study: LEV, LCM   Technical aspects: This EEG study was done with scalp electrodes positioned according to the 10-20 International system of electrode placement. Electrical activity was reviewed with band pass filter of 1-_0 , sensitivity of 7 uV/mm, display speed of 34m/sec with a _1  notched filter applied as appropriate. EEG data were recorded continuously and digitally stored.  Video monitoring was available and reviewed as appropriate.   Description: The posterior dominant rhythm consists of 7.5 Hz activity of moderate voltage (25-35 uV) seen predominantly in posterior head regions, symmetric and reactive to eye opening and eye closing. Sleep was characterized by sleep spindles (12 to 14 Hz), maximal frontocentral region. EEG showed intermittent generalized 5 to 6 Hz theta slowing admixed with intermittent generalized low amplitude 2 to 3 Hz delta slowing. Hyperventilation and photic stimulation were not performed.     Study was offline between 04/06/2022 1824 to 04/07/2022 at 7:30 AM   ABNORMALITY - Intermittent slow, generalized   IMPRESSION: This study is suggestive of mild to moderate diffuse encephalopathy, nonspecific etiology. No seizures or epileptiform discharges were seen throughout the recording.    Adriaan Maltese OBarbra Sarks

## 2022-04-07 NOTE — Progress Notes (Signed)
Modified Barium Swallow Progress Note  Patient Details  Name: LEEN TWOREK MRN: [id number] Date of Birth: [birth year]  Today's Date: 04/07/2022  Modified Barium Swallow completed.  Full report located under Chart Review in the Imaging Section.  Brief recommendations include the following:  Clinical Impression  Pt demonstrates normal swallowing. No penetration, aspiration or residue observed. Pt able to sip from cup edge, drink continuously from cup or sip of drink fully from straw without change in function. Pt took a barium tablet that passed into stomach without hesitation. She was able to masticate despite missing dentition. Recommend pt continue a regular diet and thin liquids with basic aspiration precautions. No SLP f/u needed.   Swallow Evaluation Recommendations       SLP Diet Recommendations: Regular solids;Thin liquid   Liquid Administration via: Cup;Straw   Medication Administration: Whole meds with liquid   Supervision: Patient able to self feed   Compensations: Slow rate;Small sips/bites   Postural Changes: Seated upright at 90 degrees   Oral Care Recommendations: Oral care BID        Brier Firebaugh, Katherene Ponto 04/07/2022,1:12 PM

## 2022-04-07 NOTE — Evaluation (Signed)
Occupational Therapy Evaluation Patient Details Name: [name] MRN: [id number] DOB: 1951/01/04 Today's Date: 04/07/2022   History of Present Illness The pt is a 71 yo female presenting to [facility] on 9/24 with c/o SOB. Found to have acute on chronic COPD exacerbation with increased OT2 needs. Hospital course complicated by onset of seizure 9/25 and pt transferred to [facility] for monitoring. PMH includes: asthma, HTN, TIA, CHF, COPD, DM II, seizures, tobacco use, and cirrhosis.   Clinical Impression   PTA patient reports using RW vs cane for mobility, independent ADLs. Admitted for above and presents with problem list below, including generalized weakness, decreased activity tolerance, impaired balance.  She completes transfers with min guard to supervision using RW, mobility in room with min guard using RW and ADL with up to min guard assist.  Discussed safety with using walker, carrying items during meals, and keeping O2 on at this time, as pt is on 2L at home; pt reports O2 will not reach her kitchen at home- case manager notified.  She is eager for dc home today.  Will follow acutely and recommend continued OT services at dc to optimize independence, safety and return to PLOF.      Recommendations for follow up therapy are one component of a multi-disciplinary discharge planning process, led by the attending physician.  Recommendations may be updated based on patient status, additional functional criteria and insurance authorization.   Follow Up Recommendations  Home health OT    Assistance Recommended at Discharge Frequent or constant Supervision/Assistance  Patient can return home with the following A little help with walking and/or transfers;A little help with bathing/dressing/bathroom;Assistance with cooking/housework    Functional Status Assessment  Patient has had a recent decline in their functional status and demonstrates the ability to make significant improvements in  function in a reasonable and predictable amount of time.  Equipment Recommendations  None recommended by OT    Recommendations for Other Services       Precautions / Restrictions Precautions Precautions: Fall Precaution Comments: seizure Restrictions Weight Bearing Restrictions: No      Mobility Bed Mobility Overal bed mobility: Needs Assistance Bed Mobility: Supine to Sit, Sit to Supine     Supine to sit: Supervision Sit to supine: Supervision        Transfers Overall transfer level: Needs assistance Equipment used: Rolling walker (2 wheels) Transfers: Sit to/from Stand Sit to Stand: Min guard, Supervision           General transfer comment: min guard fading to supervision, cueing for hand placement      Balance Overall balance assessment: Needs assistance Sitting-balance support: No upper extremity supported, Feet unsupported Sitting balance-Leahy Scale: Good     Standing balance support: Bilateral upper extremity supported, No upper extremity supported, During functional activity Standing balance-Leahy Scale: Fair Standing balance comment: relies on RW, able to groom without UE support but needs RW dynamically                           ADL either performed or assessed with clinical judgement   ADL Overall ADL's : Needs assistance/impaired     Grooming: Supervision/safety;Wash/dry hands;Standing           Upper Body Dressing : Set up;Sitting   Lower Body Dressing: Supervision/safety;Sit to/from stand   Toilet Transfer: Supervision/safety;Ambulation;Regular Toilet;Grab bars   Toileting- Clothing Manipulation and Hygiene: Supervision/safety;Sit to/from stand       Functional mobility during ADLs:  Supervision/safety;Rolling walker (2 wheels);Cueing for safety       Vision   Vision Assessment?: No apparent visual deficits     Perception     Praxis      Pertinent Vitals/Pain Pain Assessment Pain Assessment: No/denies pain      Hand Dominance Right   Extremity/Trunk Assessment Upper Extremity Assessment Upper Extremity Assessment: RUE deficits/detail;Generalized weakness;LUE deficits/detail RUE Deficits / Details: hx of R shoulder sx with limited shoulder flexion to 90*, decreased [place] at baseline RUE Coordination: decreased fine motor LUE Deficits / Details: decreased [place] at baseline LUE Coordination: decreased fine motor   Lower Extremity Assessment Lower Extremity Assessment: Defer to PT evaluation   Cervical / Trunk Assessment Cervical / Trunk Assessment: Normal   Communication Communication Communication: Deaf (ASL but can read lips)   Cognition Arousal/Alertness: Awake/alert Behavior During Therapy: WFL for tasks assessed/performed Overall Cognitive Status: Within Functional Limits for tasks assessed                                       General Comments  VSS on 2L    Exercises     Shoulder Instructions      Home Living Family/patient expects to be discharged to:: Private residence Living Arrangements: Children Available Help at Discharge: Family;Available PRN/intermittently (son works during the day) Type of Home: Apartment Home Access: Level entry     Home Layout: One level     Bathroom Shower/Tub: Teacher, early years/pre: [place]: Advice worker (2 wheels);Cane - single point;Rollator (4 wheels);Hand held shower head   Additional Comments: Pt lives with son & daughter in law who both work during the day (son: 52A-7P, daughter in Sports coach 58P-3A)      Prior Functioning/Environment Prior Level of Function : Independent/Modified Independent             Mobility Comments: independent in the home with use of RW or cane, uses 2L O2 only at night ADLs Comments: independent ADLs, reports she does not cook but is able to heat up meals while son away.        OT Problem List: Decreased strength;Decreased activity  tolerance;Impaired balance (sitting and/or standing);Decreased knowledge of use of DME or AE;Decreased knowledge of precautions;Impaired UE functional use      OT Treatment/Interventions: Self-care/ADL training;Therapeutic exercise;DME and/or AE instruction;Therapeutic activities;Patient/family education;Balance training;Energy conservation    OT Goals(Current goals can be found in the care plan section) Acute Rehab OT Goals Patient Stated Goal: home today! OT Goal Formulation: With patient Time For Goal Achievement: 04/21/22 Potential to Achieve Goals: Good  OT Frequency: Min 2X/week    Co-evaluation              AM-PAC OT "6 Clicks" Daily Activity     Outcome Measure Help from another person eating meals?: A Little Help from another person taking care of personal grooming?: A Little Help from another person toileting, which includes using toliet, bedpan, or urinal?: A Little Help from another person bathing (including washing, rinsing, drying)?: A Little Help from another person to put on and taking off regular upper body clothing?: A Little Help from another person to put on and taking off regular lower body clothing?: A Little 6 Click Score: 18   End of Session Equipment Utilized During Treatment: Rolling walker (2 wheels);Oxygen Nurse Communication: Mobility status  Activity Tolerance: Patient tolerated treatment  well Patient left: in bed;with call bell/phone within reach;with bed alarm set  OT Visit Diagnosis: Muscle weakness (generalized) (M62.81);Unsteadiness on feet (R26.81)                Time: 0211-1735 OT Time Calculation (min): 24 min Charges:  OT General Charges $OT Visit: 1 Visit OT Evaluation $OT Eval Low Complexity: 1 Low OT Treatments $Self Care/Home Management : 8-22 mins  Jolaine Artist, OT Acute Rehabilitation Services Office [phone]   Delight Stare 04/07/2022, 12:31 PM

## 2022-04-07 NOTE — Progress Notes (Addendum)
Subjective: No seizures overnight.  Worked with physical therapy yesterday and was recommended home health with PT.  Denies any concerns this morning.  ROS: negative except above  Examination  Vital signs in last 24 hours: Temp:  [97.6 F (36.4 C)-98.3 F (36.8 C)] 98.2 F (36.8 C) (10/02 0721) Pulse Rate:  [49-66] 66 (10/02 0721) Resp:  [16-30] 20 (10/02 0721) BP: (125-162)/(49-100) 145/72 (10/02 0721) SpO2:  [92 %-98 %] 97 % (10/02 0721)  General: lying in bed, NAD Neuro: MS: Alert, orientedX3, follows commands CN: pupils equal and reactive,  EOMI, face symmetric, tongue midline, normal sensation over face, Motor: 5/5 strength in all 4 extremities Coordination: normal Gait: not tested  Basic Metabolic Panel: Recent Labs  Lab 04/03/22 0402 04/04/22 0327 04/04/22 1644 04/05/22 0544 04/05/22 1756 04/06/22 0558  NA 144 141 140 144  --  141  K 3.4* 3.0* 5.1 4.4  --  4.2  CL 100 98 104 110  --  109  CO2 _0 --  25  GLUCOSE 94 77 207* 188*  --  91  BUN _1 25*  --  19  CREATININE 0.68 0.74 0.79 0.76  --  0.61  CALCIUM 9.6 9.1 8.7* 9.0  --  9.5  MG 2.0 2.1 1.9 2.1 2.0 1.9  PHOS 2.8  --  3.0 3.8 4.3 4.3    CBC: Recent Labs  Lab 04/02/22 1309 04/03/22 0402 04/06/22 0558  WBC 8.9 7.6 8.1  HGB 14.3 13.8 14.3  HCT 45.0 43.1 45.0  MCV 83.3 83.7 84.6  PLT 220 195 204     Coagulation Studies: No results for input(s): "LABPROT", "INR" in the last 72 hours.  Imaging MRI brain with and without contrast 04/02/2022: No acute intracranial abnormality.  No findings of PRES. Mild-to-moderate chronic small vessel ischemic disease.  ASSESSMENT AND PLAN: 71 year old female with history of epilepsy as well as nonepileptic events who presented with shortness of breath and wheezing and was treated for COPD exacerbation.  While in the hospital she was noted to have breakthrough seizures and therefore her AEDs were increased from Keppra 500 mg twice daily to 1000 mg  twice daily and Vimpat 150 mg twice daily to 200 mg twice daily.  Epilepsy with breakthrough seizure Psychogenic nonepileptic events -Patient has had multiple episodes of eye fluttering, decreased responsiveness without concomitant EEG change which were nonepileptic events. -1 routine EEG on 04/02/2022 was concerning for electrographic seizure.   Recommendations -DC LTM EEG as no seizures overnight and patient is ready for discharge -Patient is currently on Keppra 1000 mg twice daily and Vimpat 200 mg twice daily.  I would recommend continuing this for now.  However, I suspect patient has frequent nonepileptic events and therefore can likely be weaned off of at least 1 AED.  She will benefit epilepsy monitoring unit admission for characterization of spells.  I have messaged her primary neurologist Dr. Manuella Ghazi to help set this up if her episodes persist -Patient does not have generalized tonic-clonic convulsions and has limited intellectual ability to take rescue medication.  Therefore, avoiding prescribing rescue medication for now.  I did discuss the importance of medication compliance as well as causes for breakthrough seizures in detail with patient -I discussed seizure precautions including do not drive for 6 months.   -Follow-up with Dr. Manuella Ghazi at [place] [facility] in 3 months -Management of rest of comorbidities per primary team  Seizure precautions: Per [facility] statutes, patients with seizures are not  allowed to drive until they have been seizure-free for six months and cleared by a physician    Use caution when using heavy equipment or power tools. Avoid working on ladders or at heights. Take showers instead of baths. Ensure the water temperature is not too high on the home water heater. Do not go swimming alone. Do not lock yourself in a room alone (i.e. bathroom). When caring for infants or small children, sit down when holding, feeding, or changing them to minimize risk of injury to  the child in the event you have a seizure. Maintain good sleep hygiene. Avoid alcohol.    If patient has another seizure, call 911 and bring them back to the ED if: A.  The seizure lasts longer than 5 minutes.      B.  The patient doesn't wake shortly after the seizure or has new problems such as difficulty seeing, speaking or moving following the seizure C.  The patient was injured during the seizure D.  The patient has a temperature over 102 F (39C) E.  The patient vomited during the seizure and now is having trouble breathing    During the Seizure   - First, ensure adequate ventilation and place patients on the floor on their left side  Loosen clothing around the neck and ensure the airway is patent. If the patient is clenching the teeth, do not force the mouth open with any object as this can cause severe damage - Remove all items from the surrounding that can be hazardous. The patient may be oblivious to what's happening and may not even know what he or she is doing. If the patient is confused and wandering, either gently guide him/her away and block access to outside areas - Reassure the individual and be comforting - Call 911. In most cases, the seizure ends before EMS arrives. However, there are cases when seizures may last over 3 to 5 minutes. Or the individual may have developed breathing difficulties or severe injuries. If a pregnant patient or a person with diabetes develops a seizure, it is prudent to call an ambulance. - Finally, if the patient does not regain full consciousness, then call EMS. Most patients will remain confused for about 45 to 90 minutes after a seizure, so you must use judgment in calling for help.    After the Seizure (Postictal Stage)   After a seizure, most patients experience confusion, fatigue, muscle pain and/or a headache. Thus, one should permit the individual to sleep. For the next few days, reassurance is essential. Being calm and helping reorient the  person is also of importance.   Most seizures are painless and end spontaneously. Seizures are not harmful to others but can lead to complications such as stress on the lungs, brain and the heart. Individuals with prior lung problems may develop labored breathing and respiratory distress.    I have spent a total of  36  minutes with the patient reviewing hospital notes,  test results, labs and examining the patient as well as establishing an assessment and plan that was discussed personally with the patient and Dr. Thereasa Solo.  > 50% of time was spent in direct patient care.        Zeb Comfort Epilepsy [facility] For questions after 5pm please refer to AMION to reach the Neurologist on call

## 2022-04-07 NOTE — Care Management Important Message (Signed)
Important Message  Patient Details  Name: [name] MRN: [id number] Date of Birth: March 25, 1951   Medicare Important Message Given:  Yes     [name] 04/07/2022, 4:03 PM

## 2022-04-07 NOTE — Discharge Summary (Signed)
DISCHARGE SUMMARY  RHETTA CLEEK  MR#: [id number]  DOB:Feb 29, 1951  Date of Admission: 04/02/2022 Date of Discharge: 04/07/2022  Attending Physician:Afsheen Antony Hennie Duos, MD  Patient's QQV:ZDGLOV, [place]  Consults: PCCM Neurology  Disposition: D/C home w/ [facility]  Follow-up Appts:  Follow-up Information     Your Neurologist in [place], [place] Follow up.   Why: Please arrange for a follow up appointment with your Neurologist in [place], [place] in 3-5 days.        Center, [facility] Follow up.   Contact information: [place] [address] [phone]         Care, [facility] Follow up.   Specialty: Home Health Services Why: The home health agency will contact you for the first home visit Contact information: [place] [place] [address] [phone]                 Discharge Diagnoses: Seizure disorder with breakthrough seizures versus pseudoseizures Acute metabolic encephalopathy due to hypertension as well as seizures Hypertensive emergency Acute exacerbation of chronic diastolic CHF Acute Klebsiella pneumonia UTI POA Hypokalemia DM2   Initial presentation: 71 year old with a history of diastolic CHF, COPD, deafness, DM 2, and seizure disorder who presented to [facility] 9/24 with progressively worsening shortness of breath and was ultimately admitted with a COPD exacerbation complicated by diastolic CHF exacerbation.  While in the ED at [facility] the patient had witnessed seizures and was given Ativan and a Keppra load.  While an inpatient in [facility] she had recurring seizures.  Neurology placed her on Vimpat and Keppra but with concern for status epilepticus she was transferred to [facility] for continuous video EEG.  Hospital Course:  Seizure disorder with breakthrough seizures versus pseudoseizures dosed with Keppra and Vimpat and Neurontin during this admit - continuous EEG under  direction of Neurology, with no further evidence of actives seizures prior to d/c home - to f/u w/ her Neurologist in [place] Manuella Ghazi) - continue dual AEDs at d/c - D/C comments per Neurology as follows: -DC LTM EEG as no seizures overnight and patient is ready for discharge -Patient is currently on Keppra 1000 mg twice daily and Vimpat 200 mg twice daily.  I would recommend continuing this for now.  However, I suspect patient has frequent nonepileptic events and therefore can likely be weaned off of at least 1 AED.  She will benefit epilepsy monitoring unit admission for characterization of spells.  I have messaged her primary neurologist Dr. Manuella Ghazi to help set this up if her episodes persist -Patient does not have generalized tonic-clonic convulsions and has limited intellectual ability to take rescue medication.  Therefore, avoiding prescribing rescue medication for now.  I did discuss the importance of medication compliance as well as causes for breakthrough seizures in detail with patient -I discussed seizure precautions including do not drive for 6 months.     Acute metabolic encephalopathy due to hypertension as well as seizures Supportive care - control blood pressure - resolved w/ pt returning to her baseline mental status prior to d/c home w/ control of driving issues    Hypertensive emergency MRI negative for PRES - required Cleviprex transiently during hospital stay - blood pressure remains reasonably controlled at time of d/c    Acute exacerbation of chronic diastolic CHF Has been diuresed since admission -appears euvolemic at time of d/c home   Acute Klebsiella pneumonia UTI POA Has completed 5 days of ceftriaxone - no persisting sx  of activce infection at time of d/c    Hypokalemia Corrected with supplementation -magnesium was normal   DM2 CBG reasonably well controlled - A1c 6.2 -no change in treatment as outpatient   Allergies as of 04/07/2022       Reactions   Celebrex  [celecoxib] Itching   itching   Ciprofloxacin Itching   Codeine Itching   Fosphenytoin Itching   Levaquin [levofloxacin In D5w] Itching   Levofloxacin Itching   Lovastatin Itching   Pravastatin Itching   Sulfa Antibiotics Itching   Aspirin Itching, Rash   Penicillins Rash   Documentation indicates severe reaction Pt tolerated cephalosporin without adverse reaction 09/18        Medication List     TAKE these medications    citalopram 10 MG tablet Commonly known as: CELEXA Take 1 tablet (10 mg total) by mouth daily.   clopidogrel 75 MG tablet Commonly known as: PLAVIX Take 1 tablet (75 mg total) by mouth daily.   feeding supplement Liqd Take 237 mLs by mouth 3 (three) times daily between meals. What changed: when to take this   furosemide 40 MG tablet Commonly known as: LASIX Take 1 tablet (40 mg total) by mouth daily.   gabapentin 100 MG capsule Commonly known as: NEURONTIN Take 100 mg by mouth daily in the afternoon.   glimepiride 2 MG tablet Commonly known as: AMARYL Take 2 mg by mouth daily.   ibuprofen 200 MG tablet Commonly known as: ADVIL Take 400 mg by mouth as needed for headache.   ipratropium-albuterol 0.5-2.5 (3) MG/3ML Soln Commonly known as: DUONEB Take 3 mLs by nebulization in the morning, at noon, and at bedtime for 5 days. What changed: when to take this   lacosamide 200 MG Tabs tablet Commonly known as: VIMPAT Take 1 tablet (200 mg total) by mouth every 12 (twelve) hours. What changed:  medication strength how much to take when to take this   levETIRAcetam 1000 MG tablet Commonly known as: KEPPRA Take 1 tablet (1,000 mg total) by mouth every 12 (twelve) hours.   losartan 100 MG tablet Commonly known as: COZAAR Take 1 tablet (100 mg total) by mouth daily.   metoprolol succinate 25 MG 24 hr tablet Commonly known as: TOPROL-XL Take 1 tablet (25 mg total) by mouth daily. Hold for bradycardia   montelukast 10 MG tablet Commonly  known as: SINGULAIR Take 1 tablet (10 mg total) by mouth at bedtime.   pantoprazole sodium 40 mg Commonly known as: PROTONIX Take 40 mg by mouth daily.   potassium chloride 10 MEQ tablet Commonly known as: KLOR-CON M Take 20 mEq by mouth daily.   rosuvastatin 10 MG tablet Commonly known as: CRESTOR Take 1 tablet (10 mg total) by mouth at bedtime.   Spiriva Respimat 2.5 MCG/ACT Aers Generic drug: Tiotropium Bromide Monohydrate Inhale 2 puff as directed once a day   spironolactone 25 MG tablet Commonly known as: ALDACTONE Take 1 tablet (25 mg total) by mouth daily.        Day of Discharge BP 133/63 (BP Location: Left Arm)   Pulse (!) 58   Temp 98.8 F (37.1 C) (Oral)   Resp 14   Wt 75.5 kg   SpO2 97%   BMI 29.48 kg/m   Physical Exam: General: No acute respiratory distress Lungs: Clear to auscultation bilaterally without wheezes or crackles Cardiovascular: Regular rate and rhythm without murmur gallop or rub normal S1 and S2 Abdomen: Nontender, nondistended, soft, bowel sounds positive, no rebound,  no ascites, no appreciable mass Extremities: No significant cyanosis, clubbing, or edema bilateral lower extremities  Basic Metabolic Panel: Recent Labs  Lab 04/03/22 0402 04/04/22 0327 04/04/22 1644 04/05/22 0544 04/05/22 1756 04/06/22 0558  NA 144 141 140 144  --  141  K 3.4* 3.0* 5.1 4.4  --  4.2  CL 100 98 104 110  --  109  CO2 _0 --  25  GLUCOSE 94 77 207* 188*  --  91  BUN _1 25*  --  19  CREATININE 0.68 0.74 0.79 0.76  --  0.61  CALCIUM 9.6 9.1 8.7* 9.0  --  9.5  MG 2.0 2.1 1.9 2.1 2.0 1.9  PHOS 2.8  --  3.0 3.8 4.3 4.3   CBC: Recent Labs  Lab 04/02/22 1309 04/03/22 0402 04/06/22 0558  WBC 8.9 7.6 8.1  HGB 14.3 13.8 14.3  HCT 45.0 43.1 45.0  MCV 83.3 83.7 84.6  PLT 220 195 204    Time spent in discharge (includes decision making & examination of pt): 35 minutes  04/07/2022, 3:26 PM   Cherene Altes, MD [facility] Office  [phone]

## 2022-04-07 NOTE — Progress Notes (Signed)
LTM EEG discontinued - no skin breakdown at [facility]. [facility] notified

## 2022-04-07 NOTE — TOC Transition Note (Signed)
Transition of Care [facility]) - CM/SW Discharge Note   Patient Details  Name: [name] MRN: [id number] Date of Birth: Jun 25, 1951  Transition of [facility]) CM/SW Contact:  Pollie Friar, RN Phone Number: 04/07/2022, 12:08 PM   Clinical Narrative:    Pt is discharging home today with home health services through [facility]. Information on the AVS.  Pt has dme at home: oxygen, walker, rollator, shower seat, wheelchair, cane Pt is from home with her son and his girlfriend.  Pt states her medications come in bubble packs from the pharmacy.  Pt has transportation home.    Final next level of care: Home w Home Health Services Barriers to Discharge: No Barriers Identified   Patient Goals and CMS Choice   CMS Medicare.gov Compare Post Acute Care list provided to:: Patient Choice offered to / list presented to : Patient  Discharge Placement                       Discharge Plan and Services                          HH Arranged: PT, Social Work [facility] Agency: [place] Date [facility] Agency Contacted: 04/07/22   Representative spoke with at [place]: [place] Interventions     Readmission Risk Interventions     No data to display

## 2022-11-19 ENCOUNTER — Encounter: Payer: Self-pay | Admitting: Nurse Practitioner

## 2022-11-19 ENCOUNTER — Ambulatory Visit (INDEPENDENT_AMBULATORY_CARE_PROVIDER_SITE_OTHER): Payer: 59 | Admitting: Nurse Practitioner

## 2022-11-19 VITALS — BP 118/60 | HR 81 | Temp 98.3°F | Ht 63.0 in | Wt 160.8 lb

## 2022-11-19 DIAGNOSIS — J439 Emphysema, unspecified: Secondary | ICD-10-CM

## 2022-11-19 DIAGNOSIS — Z1231 Encounter for screening mammogram for malignant neoplasm of breast: Secondary | ICD-10-CM

## 2022-11-19 DIAGNOSIS — I10 Essential (primary) hypertension: Secondary | ICD-10-CM | POA: Diagnosis not present

## 2022-11-19 DIAGNOSIS — G40909 Epilepsy, unspecified, not intractable, without status epilepticus: Secondary | ICD-10-CM

## 2022-11-19 DIAGNOSIS — Z7984 Long term (current) use of oral hypoglycemic drugs: Secondary | ICD-10-CM

## 2022-11-19 DIAGNOSIS — E1169 Type 2 diabetes mellitus with other specified complication: Secondary | ICD-10-CM | POA: Diagnosis not present

## 2022-11-19 DIAGNOSIS — Z1329 Encounter for screening for other suspected endocrine disorder: Secondary | ICD-10-CM | POA: Diagnosis not present

## 2022-11-19 DIAGNOSIS — E119 Type 2 diabetes mellitus without complications: Secondary | ICD-10-CM

## 2022-11-19 DIAGNOSIS — K219 Gastro-esophageal reflux disease without esophagitis: Secondary | ICD-10-CM | POA: Diagnosis not present

## 2022-11-19 DIAGNOSIS — E785 Hyperlipidemia, unspecified: Secondary | ICD-10-CM

## 2022-11-19 LAB — CBC WITH DIFFERENTIAL/PLATELET
Basophils Absolute: 0 10*3/uL (ref 0.0–0.1)
Basophils Relative: 0.7 % (ref 0.0–3.0)
Eosinophils Absolute: 0.1 10*3/uL (ref 0.0–0.7)
Eosinophils Relative: 1.8 % (ref 0.0–5.0)
HCT: 41.3 % (ref 36.0–46.0)
Hemoglobin: 13.5 g/dL (ref 12.0–15.0)
Lymphocytes Relative: 24.7 % (ref 12.0–46.0)
Lymphs Abs: 1.8 10*3/uL (ref 0.7–4.0)
MCHC: 32.8 g/dL (ref 30.0–36.0)
MCV: 82.2 fl (ref 78.0–100.0)
Monocytes Absolute: 0.5 10*3/uL (ref 0.1–1.0)
Monocytes Relative: 6.3 % (ref 3.0–12.0)
Neutro Abs: 4.8 10*3/uL (ref 1.4–7.7)
Neutrophils Relative %: 66.5 % (ref 43.0–77.0)
Platelets: 269 10*3/uL (ref 150.0–400.0)
RBC: 5.02 Mil/uL (ref 3.87–5.11)
RDW: 15.3 % (ref 11.5–15.5)
WBC: 7.2 10*3/uL (ref 4.0–10.5)

## 2022-11-19 LAB — COMPREHENSIVE METABOLIC PANEL
ALT: 11 U/L (ref 0–35)
AST: 17 U/L (ref 0–37)
Albumin: 4 g/dL (ref 3.5–5.2)
Alkaline Phosphatase: 112 U/L (ref 39–117)
BUN: 16 mg/dL (ref 6–23)
CO2: 30 mEq/L (ref 19–32)
Calcium: 9.4 mg/dL (ref 8.4–10.5)
Chloride: 102 mEq/L (ref 96–112)
Creatinine, Ser: 0.77 mg/dL (ref 0.40–1.20)
GFR: 77.66 mL/min (ref >60.00)
Glucose, Bld: 258 mg/dL — ABNORMAL HIGH (ref 70–99)
Potassium: 4.1 mEq/L (ref 3.5–5.1)
Sodium: 141 mEq/L (ref 135–145)
Total Bilirubin: 0.4 mg/dL (ref 0.2–1.2)
Total Protein: 7 g/dL (ref 6.0–8.3)

## 2022-11-19 LAB — LIPID PANEL
Cholesterol: 136 mg/dL (ref 0–200)
HDL: 52.1 mg/dL (ref >39.00)
LDL Cholesterol: 60 mg/dL (ref 0–99)
NonHDL: 83.62
Total CHOL/HDL Ratio: 3
Triglycerides: 117 mg/dL (ref 0.0–149.0)
VLDL: 23.4 mg/dL (ref 0.0–40.0)

## 2022-11-19 LAB — VITAMIN D 25 HYDROXY (VIT D DEFICIENCY, FRACTURES): VITD: 17.09 ng/mL — ABNORMAL LOW (ref 30.00–100.00)

## 2022-11-19 LAB — HEMOGLOBIN A1C: Hgb A1c MFr Bld: 7.6 % — ABNORMAL HIGH (ref 4.6–6.5)

## 2022-11-19 LAB — TSH: TSH: 1.59 u[IU]/mL (ref 0.35–5.50)

## 2022-11-19 NOTE — Assessment & Plan Note (Signed)
Chronic. On Glimepiride 2 mg daily only. Increased episodes of hypoglycemia. Last A1c- 6.2. Will check A1c today and consider discontinuing medication and controlling with diet only. Will monitor. Counseled on small, more frequent meals or having snacks on hand for episodes of low blood sugar.

## 2022-11-19 NOTE — Assessment & Plan Note (Addendum)
Chronic. Stable on Vimpat and Keppra. Continue. Last seizure- 2022. Follow up with Neurology as scheduled.

## 2022-11-19 NOTE — Assessment & Plan Note (Signed)
Chronic. Stable on Losartan and Toprol XL. Continue. Follow up with Cardiology.

## 2022-11-19 NOTE — Assessment & Plan Note (Addendum)
Chronic. On Protonix 40 mg daily. Worsening symptoms. Would like to go back to [place]. Will refer for further evaluation. Advised to monitor for dietary triggers. Dietary information provided to patient. Will continue to monitor.

## 2022-11-19 NOTE — Assessment & Plan Note (Signed)
Currently on Breo daily and Duonebs twice daily. Continue. She is not on supplemental oxygen today. She does report shortness of breath but reports this is her normal baseline. Global wheezing and rhonchi present. She has an appointment with Pulmonology next week. Encouraged to follow up as scheduled.

## 2022-11-19 NOTE — Progress Notes (Signed)
Karen Dicker, NP-C Phone: [phone]  [name] is a 72 y.o. female who presents today with her son to establish care. Patient is deaf, she does read lips and uses sign language. ASL interpreter, Garner Nash, is present. She has no complaints or new concerns today. She is doing well on all of her medications. She is followed closely by Pulmonology, Cardiology and Neurology.   HYPERTENSION Disease Monitoring: Blood pressure range- 120s/60s Chest pain- No      Dyspnea- Yes, baseline Medications: Compliance- Losartan and Toprol XL  Lightheadedness- No   Edema- No  Lab Results  Component Value Date   NA 141 04/06/2022   K 4.2 04/06/2022   CO2 25 04/06/2022   GLUCOSE 91 04/06/2022   BUN 19 04/06/2022   CREATININE 0.61 04/06/2022   CALCIUM 9.5 04/06/2022   GFRNONAA >60 04/06/2022    DIABETES Disease Monitoring: Blood Sugar ranges- 70s-130s Polyuria/phagia/dipsia- No      Optho- Yes Medications: Compliance- Glimepiride  Hypoglycemic symptoms- Yes, frequently if she does not eat regularly  Lab Results  Component Value Date   HGBA1C 6.2 (H) 12/04/2021     HYPERLIPIDEMIA Disease Monitoring: See symptoms for Hypertension Medications: Compliance- Crestor Right upper quadrant pain- No  Muscle aches- No  Lab Results  Component Value Date   CHOL 120 04/28/2021   HDL 42 04/28/2021   LDLCALC 66 04/28/2021   TRIG 118 04/04/2022   CHOLHDL 2.9 04/28/2021    COPD: Medication compliance- Breo and Duoneb  Rescue inhaler use- None Dyspnea- Yes  Wheezing- Occasionally  Cough- Yes  Productive- Yes, worse in the morning  GERD:   Reflux symptoms: Heartburn, early satiety, bloating   Abd pain: Yes   Blood in stool: No  Dysphagia: No   EGD: Yes- 2022  Medication: Protonix   Active Ambulatory Problems    Diagnosis Date Noted   Diabetes mellitus without complication (HCC) 03/22/2016   Essential hypertension 03/22/2016   Neuropathy 03/22/2016   GERD (gastroesophageal  reflux disease) 03/23/2016   Depression 03/23/2016   Hypokalemia 02/12/2017   Hypoxia    Seizure disorder (HCC) 06/15/2017   Chronic diastolic heart failure (HCC) 08/26/2017   Tobacco use 08/26/2017   COPD (chronic obstructive pulmonary disease) (HCC)    Deaf    On home oxygen therapy    Cirrhosis, non-alcoholic (HCC)    Asthma    Fall    Hypomagnesemia    History of CVA in adulthood 08/05/2019   CHF (congestive heart failure) (HCC) 05/01/2021   Shortness of breath 09/30/2021   Elevated troponin 09/30/2021   Stress reaction causing mixed disturbance of emotion and conduct 09/30/2021   Respiratory failure, acute-on-chronic (HCC) 03/30/2022   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 with seizures of localized onset, not intractable, with status epilepticus (HCC)    Convulsions, status epilepticus (HCC) 04/02/2022   Acute systolic heart failure (HCC) 04/03/2022   Hyperlipidemia associated with type 2 diabetes mellitus (HCC) 11/19/2022   Resolved Ambulatory Problems    Diagnosis Date Noted   Acute respiratory failure with hypoxia (HCC) 03/29/2015   COPD exacerbation (HCC) 03/31/2015   Convulsions/seizures (HCC) 03/22/2016   Seizure (HCC) 03/22/2016   Sepsis (HCC) 02/11/2017   Acute on chronic respiratory failure with hypoxia (HCC) 03/24/2017   CAP (community acquired pneumonia)    Status epilepticus (HCC)    HCAP (healthcare-associated pneumonia)    Goals of care, counseling/discussion 05/06/2017   Acute on chronic diastolic CHF (congestive heart failure) (HCC) 08/15/2017   Bronchitis  09/23/2017   PNA (pneumonia) 03/01/2018   Stroke (HCC)    Closed comminuted fracture of right humerus    Comminuted fracture of right humerus 08/05/2019   S/P reverse total shoulder arthroplasty, right 12/21/2019   Aspiration pneumonia (HCC) 07/29/2020   Seizures (HCC) 04/28/2021   Uncontrolled hypertension 05/01/2021   Type 2 diabetes mellitus (HCC)  05/01/2021   COPD with acute exacerbation (HCC) 12/04/2021   Acute on chronic respiratory failure with hypoxia (HCC) 03/31/2022   Acute metabolic encephalopathy 04/02/2022   Hypertensive emergency    Acute lower UTI    Past Medical History:  Diagnosis Date   Heart murmur    Hepatitis    History of rheumatic fever    History of scarlet fever    Hypertension    IBS (irritable bowel syndrome)    Lymph node disorder    Orthopnea    Osteoarthritis    RA (rheumatoid arthritis) (HCC)    RLS (restless legs syndrome)    Shortness of breath dyspnea    Sleep apnea     Family History  Problem Relation Age of Onset   Lung cancer Mother    CAD Father    Miscarriages / Stillbirths Sister    Hyperlipidemia Sister    Hypertension Sister    Diabetes Sister    COPD Sister    Asthma Sister    Arthritis Sister    Breast cancer Sister    Heart attack Sister    Hyperlipidemia Sister    Heart disease Sister    Heart attack Sister    Diabetes Sister    Cancer Sister    Asthma Sister    Heart disease Brother    Heart attack Brother    Depression Brother    Hypertension Son    Hyperlipidemia Son    Heart disease Son    Depression Son    Hypertension Daughter    Depression Daughter    Asthma Daughter    Birth defects Paternal Uncle    Hearing loss Brother     Social History   Socioeconomic History   Marital status: Widowed    Spouse name: Not on file   Number of children: Not on file   Years of education: Not on file   Highest education level: Not on file  Occupational History   Not on file  Tobacco Use   Smoking status: Every Day    Packs/day: 0.50    Years: 52.00    Additional pack years: 0.00    Total pack years: 26.00    Types: Cigarettes    Start date: 03/01/1968   Smokeless tobacco: Never  Vaping Use   Vaping Use: Former  Substance and Sexual Activity   Alcohol use: No   Drug use: No   Sexual activity: Not Currently  Other Topics Concern   Not on file   Social History Narrative   Pt lives in 1 story home with her son, his girlfriend and a family friend   Has 2 adult children   9th grade education   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    Social Determinants of Health   Financial Resource Strain: Not on file  Food Insecurity: No Food Insecurity (03/30/2022)   Hunger Vital Sign    Worried About Running Out of Food in the Last Year: Never true    Ran Out of Food in the Last Year: Never true  Transportation Needs: No Transportation Needs (03/30/2022)  PRAPARE - Administrator, Civil Service (Medical): No    Lack of Transportation (Non-Medical): No  Physical Activity: Not on file  Stress: Not on file  Social Connections: Not on file  Intimate Partner Violence: Not At Risk (03/30/2022)   Humiliation, Afraid, Rape, and Kick questionnaire    Fear of Current or Ex-Partner: No    Emotionally Abused: No    Physically Abused: No    Sexually Abused: No    ROS  General:  Negative for unexplained weight loss, fever Skin: Negative for new or changing mole, sore that won't heal HEENT: Negative for trouble seeing, ringing in ears, mouth sores, hoarseness, change in voice, dysphagia. CV:  Negative for chest pain, edema, palpitations Resp: Negative for hemoptysis GI: Negative for nausea, vomiting, diarrhea, constipation, melena, hematochezia. GU: Negative for dysuria, incontinence, urinary hesitance, hematuria, vaginal or penile discharge, polyuria, sexual difficulty, lumps in testicle or breasts MSK: Negative for muscle cramps or aches, joint pain or swelling Neuro: Negative for headaches, weakness, numbness, dizziness, passing out/fainting Psych: Negative for depression, anxiety, memory problems  Objective  Physical Exam Vitals:   11/19/22 0901  BP: 118/60  Pulse: 81  Temp: 98.3 F (36.8 C)  SpO2: 94%    BP Readings from Last 3 Encounters:  11/19/22 118/60  04/07/22 (!) 143/64  04/02/22  (!) 166/67   Wt Readings from Last 3 Encounters:  11/19/22 160 lb 12.8 oz (72.9 kg)  04/04/22 166 lb 7.2 oz (75.5 kg)  03/31/22 172 lb 2.9 oz (78.1 kg)    Physical Exam Constitutional:      General: She is not in acute distress.    Appearance: Normal appearance.  HENT:     Head: Normocephalic.  Cardiovascular:     Rate and Rhythm: Normal rate and regular rhythm.     Heart sounds: Normal heart sounds.  Pulmonary:     Effort: Pulmonary effort is normal.     Breath sounds: Wheezing (global) and rhonchi (global) present.  Abdominal:     General: Abdomen is flat. Bowel sounds are normal.     Palpations: Abdomen is soft.     Tenderness: There is no abdominal tenderness.  Skin:    General: Skin is warm and dry.  Neurological:     General: No focal deficit present.     Mental Status: She is alert.  Psychiatric:        Mood and Affect: Mood normal.        Behavior: Behavior normal.    Assessment/Plan:   Diabetes mellitus without complication (HCC) Assessment & Plan: Chronic. On Glimepiride 2 mg daily only. Increased episodes of hypoglycemia. Last A1c- 6.2. Will check A1c today and consider discontinuing medication and controlling with diet only. Will monitor. Counseled on small, more frequent meals or having snacks on hand for episodes of low blood sugar.   Orders: -     Hemoglobin A1c  Hyperlipidemia associated with type 2 diabetes mellitus (HCC) Assessment & Plan: Chronic. Stable on Crestor daily. Continue. Will check lipids today.   Orders: -     Lipid panel  Pulmonary emphysema, unspecified emphysema type (HCC) Assessment & Plan: Currently on Breo daily and Duonebs twice daily. Continue. She is not on supplemental oxygen today. She does report shortness of breath but reports this is her normal baseline. Global wheezing and rhonchi present. She has an appointment with Pulmonology next week. Encouraged to follow up as scheduled.    Seizure disorder [facility]) Assessment &  Plan: Chronic.  Stable on Vimpat and Keppra. Continue. Last seizure- 2022. Follow up with Neurology as scheduled.    Essential hypertension Assessment & Plan: Chronic. Stable on Losartan and Toprol XL. Continue. Follow up with Cardiology.   Orders: -     CBC with Differential/Platelet -     Comprehensive metabolic panel  Gastroesophageal reflux disease without esophagitis Assessment & Plan: Chronic. On Protonix 40 mg daily. Worsening symptoms. Would like to go back to [place]. Will refer for further evaluation. Advised to monitor for dietary triggers. Dietary information provided to patient. Will continue to monitor.   Orders: -     VITAMIN D 25 Hydroxy (Vit-D Deficiency, Fractures) -     Ambulatory referral to Gastroenterology  Thyroid disorder screen -     TSH  Screening mammogram for breast cancer -     3D Screening Mammogram, Left and Right; Future    Return in about 6 months (around 05/22/2023) for Follow up.   Karen Dicker, NP-C [facility] - [employer]

## 2022-11-19 NOTE — Patient Instructions (Addendum)
YOUR MAMMOGRAM IS DUE, PLEASE CALL AND GET THIS SCHEDULED! [facility] - call [phone]   It was nice to meet you.   I will contact you with your lab results.  I have placed your referral to [facility]. They should be calling you to schedule an appointment. Please let me know if you do not hear anything from them soon.  Follow up with Pulmonology, Cardiology and Neurology as scheduled.

## 2022-11-19 NOTE — Assessment & Plan Note (Signed)
Chronic. Stable on Crestor daily. Continue. Will check lipids today.

## 2022-12-10 ENCOUNTER — Other Ambulatory Visit: Payer: Self-pay | Admitting: Nurse Practitioner

## 2022-12-11 ENCOUNTER — Other Ambulatory Visit: Payer: Self-pay

## 2022-12-11 ENCOUNTER — Emergency Department
Admission: EM | Admit: 2022-12-11 | Discharge: 2022-12-11 | Disposition: A | Discharge status: Home / Self Care | Payer: 59 | Attending: Student in an Organized Health Care Education/Training Program | Admitting: Student in an Organized Health Care Education/Training Program

## 2022-12-11 ENCOUNTER — Ambulatory Visit
Admission: RE | Admit: 2022-12-11 | Discharge: 2022-12-11 | Disposition: A | Discharge status: Home / Self Care | Payer: 59 | Source: Ambulatory Visit | Attending: Nurse Practitioner | Admitting: Nurse Practitioner

## 2022-12-11 ENCOUNTER — Emergency Department: Payer: 59

## 2022-12-11 DIAGNOSIS — S65503A Unspecified injury of blood vessel of left middle finger, initial encounter: Secondary | ICD-10-CM | POA: Diagnosis present

## 2022-12-11 DIAGNOSIS — S68629A Partial traumatic transphalangeal amputation of unspecified finger, initial encounter: Secondary | ICD-10-CM | POA: Insufficient documentation

## 2022-12-11 DIAGNOSIS — W231XXA Caught, crushed, jammed, or pinched between stationary objects, initial encounter: Secondary | ICD-10-CM | POA: Insufficient documentation

## 2022-12-11 DIAGNOSIS — Z23 Encounter for immunization: Secondary | ICD-10-CM | POA: Insufficient documentation

## 2022-12-11 DIAGNOSIS — Z1231 Encounter for screening mammogram for malignant neoplasm of breast: Secondary | ICD-10-CM | POA: Diagnosis present

## 2022-12-11 DIAGNOSIS — Z7902 Long term (current) use of antithrombotics/antiplatelets: Secondary | ICD-10-CM | POA: Diagnosis not present

## 2022-12-11 MED ORDER — OXYCODONE-ACETAMINOPHEN 5-325 MG PO TABS
1.0000 | ORAL_TABLET | Freq: Once | ORAL | Status: AC
  Administered 2022-12-11: 1 via ORAL
  Filled 2022-12-11: qty 1

## 2022-12-11 MED ORDER — BUPIVACAINE HCL (PF) 0.5 % IJ SOLN
30.0000 mL | Freq: Once | INTRAMUSCULAR | Status: AC
  Administered 2022-12-11: 30 mL
  Filled 2022-12-11: qty 30

## 2022-12-11 MED ORDER — CEPHALEXIN 500 MG PO CAPS
500.0000 mg | ORAL_CAPSULE | Freq: Once | ORAL | Status: AC
  Administered 2022-12-11: 500 mg via ORAL
  Filled 2022-12-11: qty 1

## 2022-12-11 MED ORDER — LIDOCAINE HCL (PF) 1 % IJ SOLN
5.0000 mL | Freq: Once | INTRAMUSCULAR | Status: AC
  Administered 2022-12-11: 5 mL via INTRADERMAL
  Filled 2022-12-11: qty 5

## 2022-12-11 MED ORDER — CEPHALEXIN 500 MG PO CAPS: 500.0000 mg | ORAL_CAPSULE | Freq: Three times a day (TID) | ORAL | 0 refills | Status: AC

## 2022-12-11 MED ORDER — TETANUS-DIPHTH-ACELL PERTUSSIS 5-2.5-18.5 LF-MCG/0.5 IM SUSY
0.5000 mL | PREFILLED_SYRINGE | Freq: Once | INTRAMUSCULAR | Status: AC
  Administered 2022-12-11: 0.5 mL via INTRAMUSCULAR
  Filled 2022-12-11: qty 0.5

## 2022-12-11 NOTE — ED Provider Notes (Signed)
[facility] Provider Note    Event Date/Time   First MD Initiated Contact with Patient 12/11/22 1110     (approximate)   History   Hand Injury   HPI  [name] is a 72 y.o. female right-hand-dominant who presents to the ER for evaluation of injury to her left middle finger that got caught in the car door and slammed shut.  This occurred this morning.  She is on Plavix.  No other associated injury.  The pain is moderate to severe.     Physical Exam   Triage Vital Signs: ED Triage Vitals  Enc Vitals Group     BP 12/11/22 0956 (!) 144/74     Pulse Rate 12/11/22 0956 60     Resp 12/11/22 0956 18     Temp 12/11/22 1040 98 F (36.7 C)     Temp Source 12/11/22 1040 Oral     SpO2 12/11/22 0956 100 %     Weight 12/11/22 0956 160 lb (72.6 kg)     Height 12/11/22 0956 5\' 3"  (1.6 m)     Head Circumference --      Peak Flow --      Pain Score 12/11/22 0956 7     Pain Loc --      Pain Edu? --      Excl. in GC? --     Most recent vital signs: Vitals:   12/11/22 0956 12/11/22 1040  BP: (!) 144/74   Pulse: 60   Resp: 18   Temp:  98 F (36.7 C)  SpO2: 100%      Constitutional: Alert  Eyes: Conjunctivae are normal.  Head: Atraumatic. Nose: No congestion/rhinnorhea. Mouth/Throat: Mucous membranes are moist.   Neck: Painless ROM.  Cardiovascular:   Good peripheral circulation. Respiratory: Normal respiratory effort.  No retractions.  Gastrointestinal: Soft and nontender.  Musculoskeletal: Partial amputation of the distal aspect of the left third digit. No FB  Neurologic:  MAE spontaneously. No gross focal neurologic deficits are appreciated.  Skin:  Skin is warm, dry and intact. No rash noted. Psychiatric: Mood and affect are normal. Speech and behavior are normal.    ED Results / Procedures / Treatments   Labs (all labs ordered are listed, but only abnormal results are displayed) Labs Reviewed - No data to  display   EKG     RADIOLOGY Please see ED Course for my review and interpretation.  I personally reviewed all radiographic images ordered to evaluate for the above acute complaints and reviewed radiology reports and findings.  These findings were personally discussed with the patient.  Please see medical record for radiology report.    PROCEDURES:  Critical Care performed: No  ..Laceration Repair  Date/Time: 12/11/2022 12:12 PM  Performed by: Willy Eddy, MD Authorized by: Willy Eddy, MD   Anesthesia:    Anesthesia method:  Nerve block   Block location:  Ring   Block needle gauge:  25 G   Block injection procedure:  Anatomic landmarks identified   Block outcome:  Anesthesia achieved Laceration details:    Location:  Finger   Finger location:  L long finger   Length (cm):  1 Pre-procedure details:    Preparation:  Patient was prepped and draped in usual sterile fashion and imaging obtained to evaluate for foreign bodies Exploration:    Contaminated: yes   Treatment:    Area cleansed with:  Povidone-iodine   Amount of cleaning:  Extensive Skin repair:  Repair method:  Sutures   Suture size:  5-0   Suture material:  Prolene   Suture technique:  Simple interrupted   Number of sutures:  5 Approximation:    Approximation:  Close Repair type:    Repair type:  Simple Post-procedure details:    Dressing:  Antibiotic ointment and bulky dressing   Procedure completion:  Tolerated    MEDICATIONS ORDERED IN ED: Medications  lidocaine (PF) (XYLOCAINE) 1 % injection 5 mL (5 mLs Intradermal Given by Other 12/11/22 1130)  bupivacaine(PF) (MARCAINE) 0.5 % injection 30 mL (30 mLs Infiltration Given by Other 12/11/22 1131)  oxyCODONE-acetaminophen (PERCOCET/ROXICET) 5-325 MG per tablet 1 tablet (1 tablet Oral Given 12/11/22 1129)  cephALEXin (KEFLEX) capsule 500 mg (500 mg Oral Given 12/11/22 1221)  Tdap (BOOSTRIX) injection 0.5 mL (0.5 mLs Intramuscular Given 12/11/22  1221)     IMPRESSION / MDM / ASSESSMENT AND PLAN / ED COURSE  I reviewed the triage vital signs and the nursing notes.                              Differential diagnosis includes, but is not limited to, fracture, laceration, amputation  Presented to the ER for evaluation of finger injury as described above.  Will order x-ray.  Will update tetanus.  Lac repair as above.   Clinical Course as of 12/11/22 1229  Thu Dec 11, 2022  1153 X-ray of right middle digit with partial amputation of the distal aspect of the phalanx. [PR]  1214 Tolerated repair well.  Suspicion that the germinal matrix was grossly amputated as well.  Will be given referral to Ortho hand.  Placed on prophylactic antibiotics.  Discussed signs symptoms which she should return to the ER. [PR]    Clinical Course User Index [PR] Willy Eddy, MD     FINAL CLINICAL IMPRESSION(S) / ED DIAGNOSES   Final diagnoses:  Partial traumatic amputation of finger through phalanx, initial encounter     Rx / DC Orders   ED Discharge Orders          Ordered    cephALEXin (KEFLEX) 500 MG capsule  3 times daily        12/11/22 1212             Note:  This document was prepared using Dragon voice recognition software and may include unintentional dictation errors.    Willy Eddy, MD 12/11/22 1229

## 2022-12-11 NOTE — Discharge Instructions (Addendum)
I am sending a prescription for antibiotics to your pharmacy.  Please change the dressing twice a day and apply antibiotic ointment.  You can purchase Xeroform dressing at the pharmacy to help keep bandages from sticking to the finger.  The stitches will need to be removed in 7 to 10 days depending on how things are healing and recommendations by the hand specialist.  Return if you start having any fevers or drainage from the area.

## 2022-12-11 NOTE — ED Notes (Signed)
EDP at [facility] 

## 2022-12-11 NOTE — ED Triage Notes (Signed)
Using medical interpreter for sign language- patient reports car door slammed on her middle finger a few minutes ago. Patients son brought top of finger. Pt on plavix. Patient reports feeling dizzy.

## 2022-12-11 NOTE — ED Notes (Signed)
Xray at [facility]. EDP at [facility]. Bleeding controlled with dressing. Family present. Pt alert, NAD, calm, interactive.

## 2022-12-12 ENCOUNTER — Encounter: Payer: Self-pay | Admitting: Nurse Practitioner

## 2022-12-16 ENCOUNTER — Telehealth: Payer: Self-pay

## 2022-12-16 NOTE — Transitions of Care (Post Inpatient/ED Visit) (Signed)
Unable to reach pt by phone and left v/m requesting cb [phone].     12/16/2022  Name: [name] MRN: [id number] DOB: 11-24-1950  Today's TOC FU Call Status: Today's TOC FU Call Status:: Unsuccessul Call (1st Attempt) Unsuccessful Call (1st Attempt) Date: 12/16/22  Attempted to reach the patient regarding the most recent Inpatient/ED visit.  Follow Up Plan: Additional outreach attempts will be made to reach the patient to complete the Transitions of Care (Post Inpatient/ED visit) call.   Signature [name], LPN

## 2023-01-12 ENCOUNTER — Emergency Department
Admission: EM | Admit: 2023-01-12 | Discharge: 2023-01-12 | Disposition: A | Discharge status: Home / Self Care | Payer: 59 | Attending: Emergency Medicine | Admitting: Emergency Medicine

## 2023-01-12 ENCOUNTER — Emergency Department: Payer: 59

## 2023-01-12 ENCOUNTER — Other Ambulatory Visit: Payer: Self-pay

## 2023-01-12 DIAGNOSIS — J449 Chronic obstructive pulmonary disease, unspecified: Secondary | ICD-10-CM | POA: Diagnosis not present

## 2023-01-12 DIAGNOSIS — J189 Pneumonia, unspecified organism: Secondary | ICD-10-CM

## 2023-01-12 DIAGNOSIS — J181 Lobar pneumonia, unspecified organism: Secondary | ICD-10-CM | POA: Insufficient documentation

## 2023-01-12 DIAGNOSIS — R0602 Shortness of breath: Secondary | ICD-10-CM | POA: Diagnosis present

## 2023-01-12 LAB — CBC
HCT: 38.8 % (ref 36.0–46.0)
Hemoglobin: 12.2 g/dL (ref 12.0–15.0)
MCH: 26.9 pg (ref 26.0–34.0)
MCHC: 31.4 g/dL (ref 30.0–36.0)
MCV: 85.7 fL (ref 80.0–100.0)
Platelets: 183 10*3/uL (ref 150–400)
RBC: 4.53 MIL/uL (ref 3.87–5.11)
RDW: 14.5 % (ref 11.5–15.5)
WBC: 6.8 10*3/uL (ref 4.0–10.5)
nRBC: 0 % (ref 0.0–0.2)

## 2023-01-12 LAB — BASIC METABOLIC PANEL
Anion gap: 8 (ref 5–15)
BUN: 9 mg/dL (ref 8–23)
CO2: 24 mmol/L (ref 22–32)
Calcium: 8.7 mg/dL — ABNORMAL LOW (ref 8.9–10.3)
Chloride: 106 mmol/L (ref 98–111)
Creatinine, Ser: 0.66 mg/dL (ref 0.44–1.00)
GFR, Estimated: >60 mL/min (ref >60)
Glucose, Bld: 159 mg/dL — ABNORMAL HIGH (ref 70–99)
Potassium: 3.8 mmol/L (ref 3.5–5.1)
Sodium: 138 mmol/L (ref 135–145)

## 2023-01-12 LAB — TROPONIN I (HIGH SENSITIVITY)
Troponin I (High Sensitivity): 14 ng/L (ref <18)
Troponin I (High Sensitivity): 17 ng/L (ref <18)

## 2023-01-12 MED ORDER — LIDOCAINE HCL (PF) 1 % IJ SOLN
INTRAMUSCULAR | Status: AC
  Filled 2023-01-12: qty 5

## 2023-01-12 MED ORDER — AZITHROMYCIN 250 MG PO TABS: ORAL_TABLET | ORAL | 0 refills | Status: DC

## 2023-01-12 MED ORDER — BENZONATATE 100 MG PO CAPS: 100.0000 mg | ORAL_CAPSULE | Freq: Three times a day (TID) | ORAL | 0 refills | Status: DC | PRN

## 2023-01-12 MED ORDER — CEFTRIAXONE SODIUM 1 G IJ SOLR
1.0000 g | Freq: Once | INTRAMUSCULAR | Status: AC
  Administered 2023-01-12: 1 g via INTRAMUSCULAR
  Filled 2023-01-12: qty 10

## 2023-01-12 MED ORDER — PSEUDOEPH-BROMPHEN-DM 30-2-10 MG/5ML PO SYRP: 10.0000 mL | ORAL_SOLUTION | Freq: Four times a day (QID) | ORAL | 0 refills | Status: DC | PRN

## 2023-01-12 NOTE — ED Provider Notes (Signed)
[facility] Provider Note  Patient Contact: 5:59 PM (approximate)   History   Chest Pain and Shortness of Breath   HPI  [name] is a 72 y.o. female who presents the emergency department chest pain, shortness of breath, coughing.  Patient has no increased work of breathing.  No fevers, chills.  Patient states that the last time she felt like that she did have pneumonia.  Symptoms have been ongoing for 3 to 4 weeks.  Patient denies any radiation of chest pain to her back, jaw, arms.  No cardiac history.     Physical Exam   Triage Vital Signs: ED Triage Vitals [01/12/23 1430]  Enc Vitals Group     BP (!) 159/91     Pulse Rate (!) 58     Resp 19     Temp 97.6 F (36.4 C)     Temp Source Oral     SpO2 93 %     Weight      Height      Head Circumference      Peak Flow      Pain Score 4     Pain Loc      Pain Edu?      Excl. in GC?     Most recent vital signs: Vitals:   01/12/23 1430  BP: (!) 159/91  Pulse: (!) 58  Resp: 19  Temp: 97.6 F (36.4 C)  SpO2: 93%     General: Alert and in no acute distress. ENT:      Ears:       Nose: No congestion/rhinnorhea.      Mouth/Throat: Mucous membranes are moist. Neck: No stridor. No cervical spine tenderness to palpation  Cardiovascular:  Good peripheral perfusion Respiratory: Normal respiratory effort without tachypnea or retractions. Lungs with crackles in bilateral lower lung fields.  No rales, rhonchi or wheezing.Peri Jefferson air entry to the bases with no decreased or absent breath sounds. Gastrointestinal: Bowel sounds 4 quadrants. Soft and nontender to palpation. No guarding or rigidity. No palpable masses. No distention. No CVA tenderness. Musculoskeletal: Full range of motion to all extremities.  Neurologic:  No gross focal neurologic deficits are appreciated.  Skin:   No rash noted Other:   ED Results / Procedures / Treatments   Labs (all labs ordered are listed, but only  abnormal results are displayed) Labs Reviewed  BASIC METABOLIC PANEL - Abnormal; Notable for the following components:      Result Value   Glucose, Bld 159 (*)    Calcium 8.7 (*)    All other components within normal limits  CBC  TROPONIN I (HIGH SENSITIVITY)  TROPONIN I (HIGH SENSITIVITY)     EKG  ED ECG REPORT I, Delorise Royals Ayonna Speranza,  personally viewed and interpreted this ECG.   Date: 01/12/2023.  EKG Time: 1432 hrs.  Rate: 57 bpm  Rhythm: unchanged from previous tracings, sinus bradycardia  Axis: Normal axis  Intervals:none  ST&T Change: No acute ST elevation or depression noted.    RADIOLOGY  I personally viewed, evaluated, and interpreted these images as part of my medical decision making, as well as reviewing the written report by the radiologist.  ED Provider Interpretation: Patchy opacities concerning for pneumonia.  DG Chest 2 View  Result Date: 01/12/2023 CLINICAL DATA:  Chest pain. EXAM: CHEST - 2 VIEW COMPARISON:  Chest x-ray dated April 02, 2022. FINDINGS: The heart is at the upper limits of normal in size. Normal pulmonary  vascularity. New patchy opacities in the lingula. No pleural effusion or pneumothorax. No acute osseous abnormality. Prior right shoulder arthroplasty. IMPRESSION: 1. New patchy opacities in the lingula, concerning for pneumonia. Electronically Signed   By: Obie Dredge M.D.   On: 01/12/2023 15:15    PROCEDURES:  Critical Care performed: No  Procedures   MEDICATIONS ORDERED IN ED: Medications  cefTRIAXone (ROCEPHIN) injection 1 g (1 g Intramuscular Given 01/12/23 1804)  lidocaine (PF) (XYLOCAINE) 1 % injection (  Given 01/12/23 1808)     IMPRESSION / MDM / ASSESSMENT AND PLAN / ED COURSE  I reviewed the triage vital signs and the nursing notes.                                 Differential diagnosis includes, but is not limited to, STEMI/ACS/NSTEMI, PE, pneumonia, bronchitis  Patient's presentation is most consistent  with acute presentation with potential threat to life or bodily function.   Patient's diagnosis is consistent with pneumonia.  Patient presents emergency department complaining of cough, shortness of breath times a month.  Patient has a productive cough in the ED with some mild crackles on auscultation.  Afebrile at home, no fever here.  Labs are reassuring.  Patient's EKG and troponin is reassuring as well.  Do not suspect cardiac source.  Patient does have opacities in her lungs consistent with pneumonia.  Given the fact that this has occurred in the past with similar symptoms and has been diagnosed with pneumonia I feel that this is likely patient's causative source of symptoms.  As such we will treat with shot of Rocephin here, azithromycin at home..  Follow-up with primary care as needed.  Return precautions discussed with patient.  Patient is given ED precautions to return to the ED for any worsening or new symptoms.     FINAL CLINICAL IMPRESSION(S) / ED DIAGNOSES   Final diagnoses:  Community acquired pneumonia of left upper lobe of lung     Rx / DC Orders   ED Discharge Orders          Ordered    azithromycin (ZITHROMAX Z-PAK) 250 MG tablet        01/12/23 1820    brompheniramine-pseudoephedrine-DM 30-2-10 MG/5ML syrup  4 times daily PRN        01/12/23 1820    benzonatate (TESSALON PERLES) 100 MG capsule  3 times daily PRN        01/12/23 1820             Note:  This document was prepared using Dragon voice recognition software and may include unintentional dictation errors.   Lanette Hampshire 01/12/23 1821    Jene Every, MD 01/12/23 [id number]

## 2023-01-12 NOTE — ED Triage Notes (Signed)
Pt presents to ED with c/o of chronic ABD pain that has been ongoing for 6 months, pt states SOB new as of a few days ago. Pt has HX of COPD, pt only wears O2 at night or PRN.

## 2023-01-14 ENCOUNTER — Other Ambulatory Visit: Payer: Self-pay | Admitting: Nurse Practitioner

## 2023-01-14 ENCOUNTER — Telehealth: Payer: Self-pay

## 2023-01-14 NOTE — Transitions of Care (Post Inpatient/ED Visit) (Signed)
Pt is deaf and unable to reach pt son Roberrt (DPR signed) and left v/m requesting cb [phone].      01/14/2023  Name: [name] MRN: [id number] DOB: 04-Oct-1950  Today's TOC FU Call Status: Today's TOC FU Call Status:: Unsuccessul Call (1st Attempt) Unsuccessful Call (1st Attempt) Date: 01/14/23  Attempted to reach the patient regarding the most recent Inpatient/ED visit.  Follow Up Plan: Additional outreach attempts will be made to reach the patient to complete the Transitions of Care (Post Inpatient/ED visit) call.   Signature  [name], LPN

## 2023-01-15 NOTE — Transitions of Care (Post Inpatient/ED Visit) (Signed)
I spoke with [name] (DPR signed) pt was seen 01/12/23 at [facility] ED and dx pneumonia.   pt son and caregiver said pt is feeling better and takig meds as prescribed; pt cough is no worse than the cough pt usually has due to COPD. Pt lives with pts son and when he is not home pt has a life alert button to push if needed.Pt already has appt scheduled with Karen Dicker NP 01/19/23 at 8 AM.with UC & ED precautions. Sending note to Karen Dicker NP.    01/15/2023 Name: [name] MRN: [id number] DOB: 08-18-1950  Today's TOC FU Call Status: Today's TOC FU Call Status:: Successful TOC FU Call Competed Unsuccessful Call (1st Attempt) Date: 01/14/23 [facility] FU Call Complete Date: 01/15/23  Transition Care Management Follow-up Telephone Call Date of Discharge: 01/12/23 Discharge Facility: [facility]) Type of Discharge: Emergency Department Reason for ED Visit: Respiratory Respiratory Diagnosis: Pnuemonia How have you been since you were released from the hospital?: Better (pt son and caregiver said pt is feeling better and takig meds as prescribed; pt cough is no worse than the cpough pt usually has due to COPD.) Any questions or concerns?: No  Items Reviewed: Did you receive and understand the discharge instructions provided?: Yes Medications obtained,verified, and reconciled?: Partial Review Completed Reason for Partial Mediation Review: [name] could not speak for prolonged period and [facility] prepares buble packs for scheduled meds and [name] helps pt with prn meds. Any new allergies since your discharge?: No Dietary orders reviewed?: NA Do you have support at home?: Yes People in Home: child(ren), adult Name of Support/Comfort Primary Source: [name] (pt lives wtih [name].)  Medications Reviewed Today: Medications Reviewed Today     Reviewed by Karen Dicker, NP (Nurse Practitioner) on 11/19/22 at 1431  Med List Status: <None>   Medication Order Taking?  Sig Documenting Provider Last Dose Status Informant  citalopram (CELEXA) 10 MG tablet [id number] Yes Take 1 tablet (10 mg total) by mouth daily. Karen Blood, MD Taking Active   clopidogrel (PLAVIX) 75 MG tablet [id number] Yes Take 1 tablet (75 mg total) by mouth daily. Karen Blood, MD Taking Active   fluticasone furoate-vilanterol (BREO ELLIPTA) 100-25 MCG/ACT AEPB [id number]  Inhale 1 puff into the lungs daily. [provider]  Active   furosemide (LASIX) 40 MG tablet [id number] Yes Take 1 tablet (40 mg total) by mouth daily. Karen Blood, MD Taking Active   gabapentin (NEURONTIN) 100 MG capsule [id number] Yes Take 100 mg by mouth daily in the afternoon. [provider] Taking Active Child, Pharmacy Records  glimepiride (AMARYL) 2 MG tablet [id number] Yes Take 2 mg by mouth daily. [provider] Taking Active Child, Pharmacy Records  ipratropium-albuterol (DUONEB) 0.5-2.5 (3) MG/3ML SOLN [id number]  Take 3 mLs by nebulization in the morning, at noon, and at bedtime for 5 days.  Patient taking differently: Take 3 mLs by nebulization daily.   Rolly Salter, MD  Expired 04/03/22 2359 Child, Pharmacy Records  lacosamide (VIMPAT) 200 MG TABS tablet [id number] Yes Take 1 tablet (200 mg total) by mouth every 12 (twelve) hours. Karen Blood, MD Taking Active   losartan (COZAAR) 100 MG tablet [id number] Yes Take 1 tablet (100 mg total) by mouth daily. Karen Blood, MD Taking Active   metoprolol succinate (TOPROL-XL) 25 MG 24 hr tablet [id number] Yes Take 1 tablet (25 mg total) by mouth daily. Hold for bradycardia Arnetha Courser, MD Taking Active Child, Pharmacy Records  Med Note (CRUTHIS, CHLOE C   Thu Apr 03, 2022 12:09 PM) Son is unsure of the time of last dose.   montelukast (SINGULAIR) 10 MG tablet [id number] Yes Take 1 tablet (10 mg total) by mouth at bedtime. Karen Blood, MD Taking Active   pantoprazole sodium (PROTONIX) 40 mg [id number]  Yes Take 40 mg by mouth daily. Karen Blood, MD Taking Active   potassium chloride (KLOR-CON M) 10 MEQ tablet [id number] Yes Take 20 mEq by mouth daily. [provider] Taking Active Child, Pharmacy Records  rosuvastatin (CRESTOR) 10 MG tablet [id number] Yes Take 1 tablet (10 mg total) by mouth at bedtime. Karen Blood, MD Taking Active   spironolactone (ALDACTONE) 25 MG tablet [id number] Yes Take 1 tablet (25 mg total) by mouth daily. Karen Blood, MD Taking Active             Home Care and Equipment/Supplies: Were Home Health Services Ordered?: NA Any new equipment or medical supplies ordered?: NA  Functional Questionnaire: Do you need assistance with bathing/showering or dressing?: No Do you need assistance with meal preparation?: No (Robert shops for pt and helps prepare some of pts meals) Do you need assistance with eating?: No Do you have difficulty maintaining continence: No Do you need assistance with getting out of bed/getting out of a chair/moving?: No Do you have difficulty managing or taking your medications?: No  Follow up appointments reviewed: PCP Follow-up appointment confirmed?: Yes Date of PCP follow-up appointment?: 01/19/23 Follow-up Provider: [name] NP [facility] Follow-up appointment confirmed?: NA Do you need transportation to your follow-up appointment?: No Do you understand care options if your condition(s) worsen?: Yes-patient verbalized understanding    SIGNATURE [name], LPN

## 2023-01-19 ENCOUNTER — Encounter: Payer: Self-pay | Admitting: Nurse Practitioner

## 2023-01-19 ENCOUNTER — Ambulatory Visit: Payer: 59 | Admitting: Nurse Practitioner

## 2023-01-19 VITALS — BP 138/78 | HR 55 | Temp 98.1°F | Ht 63.0 in | Wt 166.0 lb

## 2023-01-19 DIAGNOSIS — J189 Pneumonia, unspecified organism: Secondary | ICD-10-CM | POA: Insufficient documentation

## 2023-01-19 MED ORDER — IPRATROPIUM-ALBUTEROL 0.5-2.5 (3) MG/3ML IN SOLN
3.0000 mL | Freq: Three times a day (TID) | RESPIRATORY_TRACT | 0 refills | Status: DC
Start: 2023-01-19 — End: 2023-02-19

## 2023-01-19 MED ORDER — PREDNISONE 20 MG PO TABS
40.0000 mg | ORAL_TABLET | Freq: Every day | ORAL | 0 refills | Status: DC
Start: 2023-01-19 — End: 2023-03-19

## 2023-01-19 NOTE — Assessment & Plan Note (Signed)
Mild improvement in symptoms. Completed antibiotic. Continues to have coughing and shortness of breath. O2 in office on room air- 90% with improvement to 93%. She has oxygen at home that she wears to keep her oxygen above 90% if needed. Will treat with Prednisone and advised to increase nebulizer treatments to 3 times daily. She still has cough medication at home to use. Strict precautions given to patient, if worsening shortness of breath, difficulty breathing or oxygen remaining below 90% she will seek immediate medical attention. She will return in one month for repeat chest x-ray.
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
## 2023-01-19 NOTE — Progress Notes (Signed)
[name], NP-C Phone: [phone]  [name] is a 72 y.o. female who presents today for pneumonia. Sign language interpretor, Carliee, present.   Patient was evaluated in the Emergency Department on 01/12/2023 for shortness of breath and coughing. She was diagnosed with pneumonia. She was treated with 1 g Rocephin in the ED, and sent home with Azithromyin, Benzonatate and Bromfed DM. ED note and imaging reviewed. She has completed her Azithromycin. She continues to have a productive cough and shortness of breath. She does have occasional wheezing. She notes that she become very short of breath with walking, even short distances. She does have oxygen that she wears at home. She is able to monitor her oxygen levels at home, reports it does not get below 90% with her oxygen on. She has a nebulizer machine that she has been using once a day.   Social History   Tobacco Use  Smoking Status Every Day   Current packs/day: 0.50   Average packs/day: 0.5 packs/day for 54.9 years (27.4 ttl pk-yrs)   Types: Cigarettes   Start date: 03/01/1968  Smokeless Tobacco Never    Current Outpatient Medications on File Prior to Visit  Medication Sig Dispense Refill   azithromycin (ZITHROMAX Z-PAK) 250 MG tablet Take 2 tablets (500 mg) on  Day 1,  followed by 1 tablet (250 mg) once daily on Days 2 through 5. 6 each 0   benzonatate (TESSALON PERLES) 100 MG capsule Take 1 capsule (100 mg total) by mouth 3 (three) times daily as needed for cough. 30 capsule 0   brompheniramine-pseudoephedrine-DM 30-2-10 MG/5ML syrup Take 10 mLs by mouth 4 (four) times daily as needed. 200 mL 0   citalopram (CELEXA) 10 MG tablet TAKE 1 TABLET BY MOUTH DAILY 90 tablet 3   clopidogrel (PLAVIX) 75 MG tablet TAKE 1 TABLET BY MOUTH DAILY 90 tablet 1   fluticasone furoate-vilanterol (BREO ELLIPTA) 100-25 MCG/ACT AEPB Inhale 1 puff into the lungs daily.     furosemide (LASIX) 40 MG tablet Take 1 tablet (40 mg total) by mouth daily.      gabapentin (NEURONTIN) 100 MG capsule Take 100 mg by mouth daily in the afternoon.     glimepiride (AMARYL) 2 MG tablet Take 2 mg by mouth daily.     lacosamide (VIMPAT) 200 MG TABS tablet Take 1 tablet (200 mg total) by mouth every 12 (twelve) hours. 60 tablet 2   losartan (COZAAR) 100 MG tablet Take 1 tablet (100 mg total) by mouth daily.     metoprolol succinate (TOPROL-XL) 25 MG 24 hr tablet Take 1 tablet (25 mg total) by mouth daily. Hold for bradycardia 60 tablet 0   montelukast (SINGULAIR) 10 MG tablet TAKE 1 TABLET BY MOUTH AT BEDTIME 90 tablet 3   pantoprazole (PROTONIX) 40 MG tablet TAKE 1 TABLET BY MOUTH DAILY FOR REFLUX/HEARTBURN 90 tablet 3   pantoprazole sodium (PROTONIX) 40 mg Take 40 mg by mouth daily.     potassium chloride (KLOR-CON M) 10 MEQ tablet Take 20 mEq by mouth daily.     rosuvastatin (CRESTOR) 10 MG tablet Take 1 tablet (10 mg total) by mouth at bedtime.     spironolactone (ALDACTONE) 25 MG tablet Take 1 tablet (25 mg total) by mouth daily.     No current facility-administered medications on file prior to visit.    ROS see history of present illness  Objective  Physical Exam Vitals:   01/19/23 0756 01/19/23 0832  BP: (!) 140/78 138/78  Pulse: Marland Kitchen)  55   Temp: 98.1 F (36.7 C)   SpO2: 90%     BP Readings from Last 3 Encounters:  01/19/23 138/78  01/12/23 (!) 159/91  12/11/22 129/65   Wt Readings from Last 3 Encounters:  01/19/23 166 lb (75.3 kg)  12/11/22 160 lb (72.6 kg)  11/19/22 160 lb 12.8 oz (72.9 kg)    Physical Exam Constitutional:      General: She is not in acute distress.    Appearance: Normal appearance.  HENT:     Head: Normocephalic.     Right Ear: Tympanic membrane normal.     Left Ear: Tympanic membrane normal.     Nose: Nose normal.     Mouth/Throat:     Mouth: Mucous membranes are moist.     Pharynx: Oropharynx is clear.  Eyes:     Conjunctiva/sclera: Conjunctivae normal.     Pupils: Pupils are equal, round, and  reactive to light.  Cardiovascular:     Rate and Rhythm: Normal rate and regular rhythm.     Heart sounds: Normal heart sounds.  Pulmonary:     Effort: Pulmonary effort is normal.     Breath sounds: Wheezing (mild- bilateral upper lobes) present. No rhonchi or rales.  Abdominal:     General: Abdomen is flat. Bowel sounds are normal.     Palpations: Abdomen is soft. There is no mass.     Tenderness: There is no abdominal tenderness.  Lymphadenopathy:     Cervical: No cervical adenopathy.  Skin:    General: Skin is warm and dry.  Neurological:     General: No focal deficit present.     Mental Status: She is alert.  Psychiatric:        Mood and Affect: Mood normal.        Behavior: Behavior normal.    Assessment/Plan: Please see individual problem list.  Community acquired pneumonia of left lower lobe of lung Assessment & Plan: Mild improvement in symptoms. Completed antibiotic. Continues to have coughing and shortness of breath. O2 in office on room air- 90% with improvement to 93%. She has oxygen at home that she wears to keep her oxygen above 90% if needed. Will treat with Prednisone and advised to increase nebulizer treatments to 3 times daily. She still has cough medication at home to use. Strict precautions given to patient, if worsening shortness of breath, difficulty breathing or oxygen remaining below 90% she will seek immediate medical attention. She will return in one month for repeat chest x-ray.   Orders: -     predniSONE; Take 2 tablets (40 mg total) by mouth daily.  Dispense: 10 tablet; Refill: 0 -     Ipratropium-Albuterol; Take 3 mLs by nebulization in the morning, at noon, and at bedtime for 5 days.  Dispense: 45 mL; Refill: 0   Return in about 4 weeks (around 02/16/2023) for Follow up.   [name], NP-C [facility] [employer]

## 2023-01-28 ENCOUNTER — Other Ambulatory Visit: Payer: Self-pay | Admitting: Nurse Practitioner

## 2023-02-19 ENCOUNTER — Other Ambulatory Visit: Payer: Self-pay | Admitting: Nurse Practitioner

## 2023-02-19 DIAGNOSIS — J439 Emphysema, unspecified: Secondary | ICD-10-CM

## 2023-02-19 DIAGNOSIS — E119 Type 2 diabetes mellitus without complications: Secondary | ICD-10-CM

## 2023-02-19 DIAGNOSIS — J189 Pneumonia, unspecified organism: Secondary | ICD-10-CM

## 2023-02-24 ENCOUNTER — Emergency Department: Payer: 59

## 2023-02-24 ENCOUNTER — Emergency Department
Admission: EM | Admit: 2023-02-24 | Discharge: 2023-02-24 | Disposition: A | Discharge status: Home / Self Care | Payer: 59 | Attending: Emergency Medicine | Admitting: Emergency Medicine

## 2023-02-24 ENCOUNTER — Other Ambulatory Visit: Payer: Self-pay

## 2023-02-24 DIAGNOSIS — R079 Chest pain, unspecified: Secondary | ICD-10-CM

## 2023-02-24 DIAGNOSIS — I160 Hypertensive urgency: Secondary | ICD-10-CM | POA: Diagnosis not present

## 2023-02-24 DIAGNOSIS — R519 Headache, unspecified: Secondary | ICD-10-CM | POA: Insufficient documentation

## 2023-02-24 DIAGNOSIS — E119 Type 2 diabetes mellitus without complications: Secondary | ICD-10-CM | POA: Insufficient documentation

## 2023-02-24 DIAGNOSIS — I10 Essential (primary) hypertension: Secondary | ICD-10-CM | POA: Insufficient documentation

## 2023-02-24 LAB — BASIC METABOLIC PANEL
Anion gap: 9 (ref 5–15)
BUN: 8 mg/dL (ref 8–23)
CO2: 22 mmol/L (ref 22–32)
Calcium: 9.1 mg/dL (ref 8.9–10.3)
Chloride: 106 mmol/L (ref 98–111)
Creatinine, Ser: 0.62 mg/dL (ref 0.44–1.00)
GFR, Estimated: >60 mL/min (ref >60)
Glucose, Bld: 107 mg/dL — ABNORMAL HIGH (ref 70–99)
Potassium: 4.7 mmol/L (ref 3.5–5.1)
Sodium: 137 mmol/L (ref 135–145)

## 2023-02-24 LAB — CBC
HCT: 47.3 % — ABNORMAL HIGH (ref 36.0–46.0)
Hemoglobin: 14.6 g/dL (ref 12.0–15.0)
MCH: 26.9 pg (ref 26.0–34.0)
MCHC: 30.9 g/dL (ref 30.0–36.0)
MCV: 87.1 fL (ref 80.0–100.0)
Platelets: 177 10*3/uL (ref 150–400)
RBC: 5.43 MIL/uL — ABNORMAL HIGH (ref 3.87–5.11)
RDW: 13.2 % (ref 11.5–15.5)
WBC: 7.3 10*3/uL (ref 4.0–10.5)
nRBC: 0 % (ref 0.0–0.2)

## 2023-02-24 LAB — TROPONIN I (HIGH SENSITIVITY): Troponin I (High Sensitivity): 10 ng/L (ref <18)

## 2023-02-24 LAB — BRAIN NATRIURETIC PEPTIDE: B Natriuretic Peptide: 281.2 pg/mL — ABNORMAL HIGH (ref 0.0–100.0)

## 2023-02-24 NOTE — ED Triage Notes (Signed)
Pt to ED for HTN from [facility]. Also reports shob and chest pain for "awhile". Pt in NAD Reports took meds this am.  Pt hearing impaired, reading lips in triage

## 2023-02-24 NOTE — Discharge Instructions (Signed)
You were seen in the Emergency Department today for evaluation of your chest pain. Fortunately, your labs, EKG, and chest x-Ares Tegtmeyer were overall reassuring against a emergency cause for your pain.  Your blood pressure was elevated, please follow-up closely with your primary care doctor for further evaluation.  Return to the ER for any new or worsening symptoms including worsening chest pain, difficulty breathing, or any other new or concerning symptoms that you believe warrants immediate attention.

## 2023-02-24 NOTE — ED Provider Notes (Signed)
[facility] Provider Note    Event Date/Time   First MD Initiated Contact with Patient 02/24/23 1142     (approximate)   History   Chest Pain and Shortness of Breath   HPI  [name] is a 72 y.o. female with history of diabetes, hypertension, seizure disorder presenting to the emergency department for evaluation of elevated blood pressure and chest pain.  Patient was in neurology clinic earlier today.  There, she was noted to have an elevated blood pressure reading of 188/88.  Patient reports that for the last several days she has had some intermittent mild chest discomfort, not specifically exertional or pleuritic.  Denies shortness of breath to me.  Does additionally report a mild headache, not sudden in onset.  No numbness, tingling, focal weakness.  Of note, patient is hearing impaired, did attempt to get an in person interpreter, but none were available.  Patient declined use of video interpreter, but did appear to have good understanding with reading lips.      Physical Exam   Triage Vital Signs: ED Triage Vitals  Encounter Vitals Group     BP 02/24/23 1114 (!) 175/101     Systolic BP Percentile --      Diastolic BP Percentile --      Pulse Rate 02/24/23 1114 61     Resp 02/24/23 1114 20     Temp 02/24/23 1117 98.6 F (37 C)     Temp src --      SpO2 02/24/23 1114 95 %     Weight 02/24/23 1114 165 lb (74.8 kg)     Height 02/24/23 1114 5\' 3"  (1.6 m)     Head Circumference --      Peak Flow --      Pain Score 02/24/23 1114 3     Pain Loc --      Pain Education --      Exclude from Growth Chart --     Most recent vital signs: Vitals:   02/24/23 1114 02/24/23 1117  BP: (!) 175/101   Pulse: 61   Resp: 20   Temp:  98.6 F (37 C)  SpO2: 95%      General: Awake, interactive  CV:  Regular rate, good peripheral perfusion.  Resp:  Lungs clear, unlabored respirations.  Abd:  Soft, nondistended.  Neuro:  Alert and oriented, normal  extraocular movements, symmetric facial movement, sensation intact over bilateral upper and lower extremities with 5 out of 5 strength.  Normal finger-to-nose testing.  ED Results / Procedures / Treatments   Labs (all labs ordered are listed, but only abnormal results are displayed) Labs Reviewed  BASIC METABOLIC PANEL - Abnormal; Notable for the following components:      Result Value   Glucose, Bld 107 (*)    All other components within normal limits  CBC - Abnormal; Notable for the following components:   RBC 5.43 (*)    HCT 47.3 (*)    All other components within normal limits  BRAIN NATRIURETIC PEPTIDE - Abnormal; Notable for the following components:   B Natriuretic Peptide 281.2 (*)    All other components within normal limits  TROPONIN I (HIGH SENSITIVITY)     EKG EKG independently reviewed interpreted by myself (ER attending) demonstrates:  EKG demonstrates sinus bradycardia at a rate of 59, PR 108, QRS 78, QTc 443, no acute ST changes  RADIOLOGY Imaging independently reviewed and interpreted by myself demonstrates:  CXR without focal  consolidation CT head without acute bleed  PROCEDURES:  Critical Care performed: No  Procedures   MEDICATIONS ORDERED IN ED: Medications - No data to display   IMPRESSION / MDM / ASSESSMENT AND PLAN / ED COURSE  I reviewed the triage vital signs and the nursing notes.  Differential diagnosis includes, but is not limited to, hypertensive urgency, hypertensive emergency including cardiac strain, pulmonary edema, intracranial bleed  Patient's presentation is most consistent with acute presentation with potential threat to life or bodily function.  72 year old female presenting to the ER with several days of chest pain and elevated blood pressure.  Workup here with slightly elevated BNP at 281, stable to improved from recent priors.  Negative troponin and several days of symptoms.  Chest x-Lakechia Nay without evidence of significant volume  overload on exam and x-Kora Groom without appreciable pulmonary edema.  CT head without acute bleed.  Patient reevaluated and updated on the results of her workup.  She is comfortable with discharge home and outpatient follow-up.  Do think this is reasonable.  Strict return precautions provided.      FINAL CLINICAL IMPRESSION(S) / ED DIAGNOSES   Final diagnoses:  Hypertensive urgency  Nonspecific chest pain     Rx / DC Orders   ED Discharge Orders     None        Note:  This document was prepared using Dragon voice recognition software and may include unintentional dictation errors.   Trinna Post, MD 02/24/23 [phone]

## 2023-02-24 NOTE — ED Notes (Signed)
Attempted to request Face to face ASL interpreter. Due to limited availably within the hospital, Interpreter services advised it maybe unlikely

## 2023-02-27 ENCOUNTER — Telehealth: Payer: Self-pay

## 2023-02-27 NOTE — Transitions of Care (Post Inpatient/ED Visit) (Signed)
Unable to reach pt by phone and left v/m requesting cb [phone].      02/27/2023  Name: [name] MRN: [id number] DOB: Feb 02, 1951  Today's TOC FU Call Status: Today's TOC FU Call Status:: Unsuccessful Call (1st Attempt) Unsuccessful Call (1st Attempt) Date: 02/27/23  Attempted to reach the patient regarding the most recent Inpatient/ED visit.  Follow Up Plan: Additional outreach attempts will be made to reach the patient to complete the Transitions of Care (Post Inpatient/ED visit) call.   Signature [name], LPN

## 2023-03-03 ENCOUNTER — Other Ambulatory Visit (HOSPITAL_COMMUNITY): Payer: Self-pay | Admitting: Student

## 2023-03-03 DIAGNOSIS — R413 Other amnesia: Secondary | ICD-10-CM

## 2023-03-05 ENCOUNTER — Ambulatory Visit (INDEPENDENT_AMBULATORY_CARE_PROVIDER_SITE_OTHER): Payer: 59 | Admitting: Nurse Practitioner

## 2023-03-05 ENCOUNTER — Encounter: Payer: Self-pay | Admitting: Nurse Practitioner

## 2023-03-05 VITALS — BP 130/70 | HR 77 | Temp 98.1°F | Ht 63.0 in | Wt 159.4 lb

## 2023-03-05 DIAGNOSIS — I5032 Chronic diastolic (congestive) heart failure: Secondary | ICD-10-CM

## 2023-03-05 DIAGNOSIS — I10 Essential (primary) hypertension: Secondary | ICD-10-CM

## 2023-03-05 MED ORDER — FUROSEMIDE 40 MG PO TABS
40.0000 mg | ORAL_TABLET | Freq: Every day | ORAL | 3 refills | Status: AC
Start: 2023-03-05 — End: ?

## 2023-03-05 NOTE — Progress Notes (Signed)
[name], NP-C Phone: [phone]  [name] is a 72 y.o. female who presents today for hospital follow up. She is present with her son and sign language interpretor, Jillian.   Patient evaluated in the ED on 02/24/2023 for hypertensive urgency and chest pain. Blood pressure at that time was 175/101. She had a negative work up, chest xray, CT head and EKG without acute changes.   Her son, who is in charge of her medications, does note that she has been without her Lasix and Vimpat for at least one month. They recently picked up her Vimpat and have noticed her blood pressure has improved since she has started back on it. She has not seen Cardiology recently.   HYPERTENSION Disease Monitoring Home BP Monitoring- 160s/70 Chest pain- No    Dyspnea- No Medications Compliance-  Losartan. Toprol XL, Spironolactone. Lightheadedness-  No  Edema- No BMET    Component Value Date/Time   NA 137 02/24/2023 1117   NA 139 11/05/2013 1138   K 4.7 02/24/2023 1117   K 3.3 (L) 11/05/2013 1138   CL 106 02/24/2023 1117   CL 103 11/05/2013 1138   CO2 22 02/24/2023 1117   CO2 29 11/05/2013 1138   GLUCOSE 107 (H) 02/24/2023 1117   GLUCOSE 219 (H) 11/05/2013 1138   BUN 8 02/24/2023 1117   BUN 9 11/05/2013 1138   CREATININE 0.62 02/24/2023 1117   CREATININE 0.35 (L) 11/05/2013 1138   CALCIUM 9.1 02/24/2023 1117   CALCIUM 8.9 11/05/2013 1138   GFRNONAA >60 02/24/2023 1117   GFRNONAA >60 11/05/2013 1138   GFRAA >60 12/24/2019 0514   GFRAA >60 11/05/2013 1138    Social History   Tobacco Use  Smoking Status Every Day   Current packs/day: 0.50   Average packs/day: 0.5 packs/day for 55.0 years (27.5 ttl pk-yrs)   Types: Cigarettes   Start date: 03/01/1968  Smokeless Tobacco Never    Current Outpatient Medications on File Prior to Visit  Medication Sig Dispense Refill   citalopram (CELEXA) 10 MG tablet TAKE 1 TABLET BY MOUTH DAILY 90 tablet 3   clopidogrel (PLAVIX) 75 MG tablet TAKE 1  TABLET BY MOUTH DAILY 90 tablet 1   fluticasone furoate-vilanterol (BREO ELLIPTA) 100-25 MCG/ACT AEPB Inhale 1 puff into the lungs daily.     gabapentin (NEURONTIN) 100 MG capsule Take 100 mg by mouth daily in the afternoon.     glimepiride (AMARYL) 2 MG tablet TAKE 1 TABLET BY MOUTH DAILY FOR DIABETES 90 tablet 3   ipratropium-albuterol (DUONEB) 0.5-2.5 (3) MG/3ML SOLN INHALE 1 VIAL USING NEBULIZER EVERY SIX HOURS AS NEEDED FOR SHORTNESS OF BREATH 90 mL 1   lacosamide (VIMPAT) 200 MG TABS tablet Take 1 tablet (200 mg total) by mouth every 12 (twelve) hours. 60 tablet 2   montelukast (SINGULAIR) 10 MG tablet TAKE 1 TABLET BY MOUTH AT BEDTIME 90 tablet 3   pantoprazole (PROTONIX) 40 MG tablet TAKE 1 TABLET BY MOUTH DAILY FOR REFLUX/HEARTBURN 90 tablet 3   pantoprazole sodium (PROTONIX) 40 mg Take 40 mg by mouth daily.     potassium chloride (KLOR-CON M) 10 MEQ tablet Take 20 mEq by mouth daily.     predniSONE (DELTASONE) 20 MG tablet Take 2 tablets (40 mg total) by mouth daily. 10 tablet 0   rosuvastatin (CRESTOR) 10 MG tablet Take 1 tablet (10 mg total) by mouth at bedtime.     spironolactone (ALDACTONE) 25 MG tablet TAKE 1 TABLET BY MOUTH EVERY MORNING 90 tablet  0   No current facility-administered medications on file prior to visit.    ROS see history of present illness  Objective  Physical Exam Vitals:   03/05/23 0855 03/05/23 0915  BP: (!) 144/70 130/70  Pulse: 77   Temp: 98.1 F (36.7 C)   SpO2: 92%     BP Readings from Last 3 Encounters:  03/05/23 130/70  02/24/23 (!) 175/101  01/19/23 138/78   Wt Readings from Last 3 Encounters:  03/05/23 159 lb 6.4 oz (72.3 kg)  02/24/23 165 lb (74.8 kg)  01/19/23 166 lb (75.3 kg)    Physical Exam Constitutional:      General: She is not in acute distress.    Appearance: Normal appearance.  HENT:     Head: Normocephalic.  Cardiovascular:     Rate and Rhythm: Normal rate and regular rhythm.     Heart sounds: Normal heart  sounds.  Pulmonary:     Effort: Pulmonary effort is normal.     Breath sounds: Normal breath sounds.  Skin:    General: Skin is warm and dry.  Neurological:     General: No focal deficit present.     Mental Status: She is alert.  Psychiatric:        Mood and Affect: Mood normal.        Behavior: Behavior normal.    Assessment/Plan: Please see individual problem list.  Essential hypertension Assessment & Plan: Chronic. Elevated reading in office x 1, stable after second reading. She will continue her current medication regimen. Counseled on importance of taking medications daily as prescribed. Encouraged to monitor blood pressure daily at home and keep a log to bring with her to her next appointment. Strict precautions given to patient and family. She will return in 2 weeks for follow up. Will continue to monitor closely.    Chronic diastolic heart failure (HCC) Assessment & Plan: Chronic. Continue current medication regimen. Euvolemic. Refills on Lasix sent. Encouraged to follow up with Cardiology.   Orders: -     Furosemide; Take 1 tablet (40 mg total) by mouth daily.  Dispense: 90 tablet; Refill: 3   Return in about 2 weeks (around 03/19/2023) for Follow up.   [name], NP-C  [facility] - [facility]

## 2023-03-14 ENCOUNTER — Ambulatory Visit (HOSPITAL_COMMUNITY)
Admission: RE | Admit: 2023-03-14 | Discharge: 2023-03-14 | Disposition: A | Discharge status: Home / Self Care | Payer: 59 | Source: Ambulatory Visit | Attending: Student | Admitting: Student

## 2023-03-14 DIAGNOSIS — R413 Other amnesia: Secondary | ICD-10-CM | POA: Diagnosis not present

## 2023-03-14 DIAGNOSIS — R9089 Other abnormal findings on diagnostic imaging of central nervous system: Secondary | ICD-10-CM | POA: Diagnosis not present

## 2023-03-15 ENCOUNTER — Other Ambulatory Visit: Payer: Self-pay | Admitting: Nurse Practitioner

## 2023-03-15 DIAGNOSIS — I10 Essential (primary) hypertension: Secondary | ICD-10-CM

## 2023-03-17 ENCOUNTER — Encounter: Payer: Self-pay | Admitting: Nurse Practitioner

## 2023-03-17 NOTE — Assessment & Plan Note (Addendum)
Chronic. Elevated reading in office x 1, stable after second reading. She will continue her current medication regimen. Counseled on importance of taking medications daily as prescribed. Encouraged to monitor blood pressure daily at home and keep a log to bring with her to her next appointment. Strict precautions given to patient and family. She will return in 2 weeks for follow up. Will continue to monitor closely.

## 2023-03-17 NOTE — Assessment & Plan Note (Signed)
Chronic. Continue current medication regimen. Euvolemic. Refills on Lasix sent. Encouraged to follow up with Cardiology.

## 2023-03-19 ENCOUNTER — Encounter: Payer: Self-pay | Admitting: Nurse Practitioner

## 2023-03-19 ENCOUNTER — Ambulatory Visit (INDEPENDENT_AMBULATORY_CARE_PROVIDER_SITE_OTHER): Payer: 59 | Admitting: Nurse Practitioner

## 2023-03-19 VITALS — BP 136/80 | HR 65 | Temp 98.2°F | Ht 63.0 in | Wt 157.4 lb

## 2023-03-19 DIAGNOSIS — Z7984 Long term (current) use of oral hypoglycemic drugs: Secondary | ICD-10-CM

## 2023-03-19 DIAGNOSIS — J439 Emphysema, unspecified: Secondary | ICD-10-CM

## 2023-03-19 DIAGNOSIS — E785 Hyperlipidemia, unspecified: Secondary | ICD-10-CM

## 2023-03-19 DIAGNOSIS — I10 Essential (primary) hypertension: Secondary | ICD-10-CM

## 2023-03-19 DIAGNOSIS — E1169 Type 2 diabetes mellitus with other specified complication: Secondary | ICD-10-CM | POA: Diagnosis not present

## 2023-03-19 DIAGNOSIS — E119 Type 2 diabetes mellitus without complications: Secondary | ICD-10-CM

## 2023-03-19 LAB — COMPREHENSIVE METABOLIC PANEL
ALT: 11 U/L (ref 0–35)
AST: 18 U/L (ref 0–37)
Albumin: 4 g/dL (ref 3.5–5.2)
Alkaline Phosphatase: 98 U/L (ref 39–117)
BUN: 18 mg/dL (ref 6–23)
CO2: 27 meq/L (ref 19–32)
Calcium: 9.3 mg/dL (ref 8.4–10.5)
Chloride: 105 meq/L (ref 96–112)
Creatinine, Ser: 0.76 mg/dL (ref 0.40–1.20)
GFR: 78.71 mL/min (ref >60.00)
Glucose, Bld: 121 mg/dL — ABNORMAL HIGH (ref 70–99)
Potassium: 3.7 meq/L (ref 3.5–5.1)
Sodium: 141 meq/L (ref 135–145)
Total Bilirubin: 0.5 mg/dL (ref 0.2–1.2)
Total Protein: 7.2 g/dL (ref 6.0–8.3)

## 2023-03-19 LAB — CBC WITH DIFFERENTIAL/PLATELET
Basophils Absolute: 0.1 10*3/uL (ref 0.0–0.1)
Basophils Relative: 1.7 % (ref 0.0–3.0)
Eosinophils Absolute: 0.1 10*3/uL (ref 0.0–0.7)
Eosinophils Relative: 1.3 % (ref 0.0–5.0)
HCT: 42.6 % (ref 36.0–46.0)
Hemoglobin: 13.7 g/dL (ref 12.0–15.0)
Lymphocytes Relative: 29.9 % (ref 12.0–46.0)
Lymphs Abs: 2.4 10*3/uL (ref 0.7–4.0)
MCHC: 32.1 g/dL (ref 30.0–36.0)
MCV: 83.4 fl (ref 78.0–100.0)
Monocytes Absolute: 0.6 10*3/uL (ref 0.1–1.0)
Monocytes Relative: 7.2 % (ref 3.0–12.0)
Neutro Abs: 4.7 10*3/uL (ref 1.4–7.7)
Neutrophils Relative %: 59.9 % (ref 43.0–77.0)
Platelets: 256 10*3/uL (ref 150.0–400.0)
RBC: 5.11 Mil/uL (ref 3.87–5.11)
RDW: 13.9 % (ref 11.5–15.5)
WBC: 7.9 10*3/uL (ref 4.0–10.5)

## 2023-03-19 LAB — HEMOGLOBIN A1C: Hgb A1c MFr Bld: 7.1 % — ABNORMAL HIGH (ref 4.6–6.5)

## 2023-03-19 NOTE — Progress Notes (Signed)
[name], NP-C Phone: [phone]  [name] is a 72 y.o. female who presents today for follow up.   HYPERTENSION Disease Monitoring: Blood pressure range- 110-130/60s Chest pain- No      Dyspnea- No Medications: Compliance- Losartan, Toprol XL, Spironolactone, Lasix Lightheadedness- No   Edema- No  Lab Results  Component Value Date   NA 140 03/25/2023   K 3.6 03/25/2023   CO2 23 03/25/2023   GLUCOSE 179 (H) 03/25/2023   BUN 15 03/25/2023   CREATININE 0.65 03/25/2023   CALCIUM 8.7 (L) 03/25/2023   GFRNONAA >60 03/25/2023    DIABETES Disease Monitoring: Blood Sugar ranges- Not checking Polyuria/phagia/dipsia- No      Optho- Yes Medications: Compliance- Glimepiride Hypoglycemic symptoms- Occasionally, if not eating regularly  Lab Results  Component Value Date   HGBA1C 7.1 (H) 03/19/2023     HYPERLIPIDEMIA Disease Monitoring: See symptoms for Hypertension Medications: Compliance- Crestor Right upper quadrant pain- No  Muscle aches- No  Lab Results  Component Value Date   CHOL 136 11/19/2022   HDL 52.10 11/19/2022   LDLCALC 60 11/19/2022   TRIG 117.0 11/19/2022   CHOLHDL 3 11/19/2022   COPD: Medication compliance- Breo daily  Rescue inhaler use- Nebulizer 2 times daily Dyspnea- No  Wheezing- No  Cough- Yes  Productive- Occasionally  Social History   Tobacco Use  Smoking Status Every Day   Current packs/day: 0.50   Average packs/day: 0.5 packs/day for 55.1 years (27.5 ttl pk-yrs)   Types: Cigarettes   Start date: 03/01/1968  Smokeless Tobacco Never    No current facility-administered medications on file prior to visit.   Current Outpatient Medications on File Prior to Visit  Medication Sig Dispense Refill   citalopram (CELEXA) 10 MG tablet TAKE 1 TABLET BY MOUTH DAILY 90 tablet 3   clopidogrel (PLAVIX) 75 MG tablet TAKE 1 TABLET BY MOUTH DAILY 90 tablet 1   fluticasone furoate-vilanterol (BREO ELLIPTA) 100-25 MCG/ACT AEPB Inhale 1 puff into  the lungs daily.     furosemide (LASIX) 40 MG tablet Take 1 tablet (40 mg total) by mouth daily. 90 tablet 3   gabapentin (NEURONTIN) 100 MG capsule Take 200 mg by mouth at bedtime.     glimepiride (AMARYL) 2 MG tablet TAKE 1 TABLET BY MOUTH DAILY FOR DIABETES 90 tablet 3   ipratropium-albuterol (DUONEB) 0.5-2.5 (3) MG/3ML SOLN INHALE 1 VIAL USING NEBULIZER EVERY SIX HOURS AS NEEDED FOR SHORTNESS OF BREATH 90 mL 1   Lacosamide (VIMPAT) 150 MG TABS Take 150 mg by mouth every 12 (twelve) hours.     losartan (COZAAR) 100 MG tablet TAKE 1 TABLET BY MOUTH DAILY AS DIRECTED 90 tablet 3   metoprolol succinate (TOPROL-XL) 25 MG 24 hr tablet TAKE 1 TABLET BY MOUTH DAILY 90 tablet 3   montelukast (SINGULAIR) 10 MG tablet TAKE 1 TABLET BY MOUTH AT BEDTIME 90 tablet 3   pantoprazole (PROTONIX) 40 MG tablet TAKE 1 TABLET BY MOUTH DAILY FOR REFLUX/HEARTBURN 90 tablet 3   pantoprazole sodium (PROTONIX) 40 mg Take 40 mg by mouth daily.     potassium chloride (KLOR-CON M) 10 MEQ tablet Take 10 mEq by mouth daily.     spironolactone (ALDACTONE) 25 MG tablet TAKE 1 TABLET BY MOUTH EVERY MORNING 90 tablet 0    ROS see history of present illness  Objective  Physical Exam Vitals:   03/19/23 0839  BP: 136/80  Pulse: 65  Temp: 98.2 F (36.8 C)  SpO2: 91%    BP  Readings from Last 3 Encounters:  03/27/23 (!) 158/71  03/19/23 136/80  03/05/23 130/70   Wt Readings from Last 3 Encounters:  03/25/23 160 lb 7.9 oz (72.8 kg)  03/19/23 157 lb 6.4 oz (71.4 kg)  03/05/23 159 lb 6.4 oz (72.3 kg)    Physical Exam Constitutional:      General: She is not in acute distress.    Appearance: Normal appearance.  HENT:     Head: Normocephalic.  Cardiovascular:     Rate and Rhythm: Normal rate and regular rhythm.     Heart sounds: Normal heart sounds.  Pulmonary:     Effort: Pulmonary effort is normal.     Breath sounds: Normal breath sounds.  Skin:    General: Skin is warm and dry.  Neurological:      General: No focal deficit present.     Mental Status: She is alert.  Psychiatric:        Mood and Affect: Mood normal.        Behavior: Behavior normal.    Assessment/Plan: Please see individual problem list.  Diabetes mellitus without complication (HCC) Assessment & Plan: Chronic. Taking Glimepiride 2 mg daily. Last A1c- 7.6. Will check A1c today. Encouraged healthy diet. If A1c more elevated will add medication. Will continue to monitor.   Orders: -     Hemoglobin A1c  Essential hypertension Assessment & Plan: Chronic. Stable today in office. Home BP log reviewed- stable. She will continue her current mediation regimen. Lab work as outlined. Encouraged to continue checking her blood pressure at home. Will continue to monitor.   Orders: -     CBC with Differential/Platelet -     Comprehensive metabolic panel  Hyperlipidemia associated with type 2 diabetes mellitus (HCC) Assessment & Plan: Chronic. Stable on Crestor daily. Continue.   Pulmonary emphysema, unspecified emphysema type (HCC) Assessment & Plan: Chronic. Stable on Breo daily and Duonebs twice daily. Continue. She is not on supplemental oxygen today. Denies shortness of breath today. She does report coughing. She has a follow up appointment with Pulmonology next week. Encouraged to follow up as scheduled.     Return in about 3 months (around 06/18/2023) for Follow up.   [name], NP-C [facility]

## 2023-03-20 ENCOUNTER — Other Ambulatory Visit: Payer: Self-pay | Admitting: Cardiology

## 2023-03-20 DIAGNOSIS — R0789 Other chest pain: Secondary | ICD-10-CM

## 2023-03-20 DIAGNOSIS — E1140 Type 2 diabetes mellitus with diabetic neuropathy, unspecified: Secondary | ICD-10-CM | POA: Diagnosis not present

## 2023-03-20 DIAGNOSIS — I10 Essential (primary) hypertension: Secondary | ICD-10-CM | POA: Diagnosis not present

## 2023-03-20 DIAGNOSIS — I350 Nonrheumatic aortic (valve) stenosis: Secondary | ICD-10-CM | POA: Diagnosis not present

## 2023-03-20 DIAGNOSIS — I5032 Chronic diastolic (congestive) heart failure: Secondary | ICD-10-CM

## 2023-03-20 DIAGNOSIS — E7849 Other hyperlipidemia: Secondary | ICD-10-CM | POA: Diagnosis not present

## 2023-03-23 ENCOUNTER — Other Ambulatory Visit: Payer: Self-pay | Admitting: Nurse Practitioner

## 2023-03-23 DIAGNOSIS — E1169 Type 2 diabetes mellitus with other specified complication: Secondary | ICD-10-CM

## 2023-03-24 ENCOUNTER — Other Ambulatory Visit: Payer: Self-pay | Admitting: Nurse Practitioner

## 2023-03-24 DIAGNOSIS — F17218 Nicotine dependence, cigarettes, with other nicotine-induced disorders: Secondary | ICD-10-CM | POA: Diagnosis not present

## 2023-03-24 DIAGNOSIS — R058 Other specified cough: Secondary | ICD-10-CM | POA: Diagnosis not present

## 2023-03-24 DIAGNOSIS — R059 Cough, unspecified: Secondary | ICD-10-CM | POA: Diagnosis not present

## 2023-03-24 DIAGNOSIS — Z96611 Presence of right artificial shoulder joint: Secondary | ICD-10-CM | POA: Diagnosis not present

## 2023-03-24 DIAGNOSIS — Z9981 Dependence on supplemental oxygen: Secondary | ICD-10-CM | POA: Diagnosis not present

## 2023-03-24 DIAGNOSIS — R0602 Shortness of breath: Secondary | ICD-10-CM | POA: Diagnosis not present

## 2023-03-24 DIAGNOSIS — R918 Other nonspecific abnormal finding of lung field: Secondary | ICD-10-CM | POA: Diagnosis not present

## 2023-03-24 DIAGNOSIS — E1169 Type 2 diabetes mellitus with other specified complication: Secondary | ICD-10-CM

## 2023-03-24 NOTE — Telephone Encounter (Signed)
Previously filled by Dr. Sharon Seller. Is it okay to refill?

## 2023-03-25 ENCOUNTER — Inpatient Hospital Stay: Payer: 59

## 2023-03-25 ENCOUNTER — Emergency Department: Payer: 59

## 2023-03-25 ENCOUNTER — Other Ambulatory Visit: Payer: Self-pay

## 2023-03-25 ENCOUNTER — Inpatient Hospital Stay
Admission: EM | Admit: 2023-03-25 | Discharge: 2023-03-29 | DRG: 193 | Disposition: A | Discharge status: Home Health | Payer: 59 | Attending: Internal Medicine | Admitting: Internal Medicine

## 2023-03-25 DIAGNOSIS — I10 Essential (primary) hypertension: Secondary | ICD-10-CM | POA: Diagnosis not present

## 2023-03-25 DIAGNOSIS — Z79899 Other long term (current) drug therapy: Secondary | ICD-10-CM

## 2023-03-25 DIAGNOSIS — G2581 Restless legs syndrome: Secondary | ICD-10-CM | POA: Diagnosis present

## 2023-03-25 DIAGNOSIS — Z1152 Encounter for screening for COVID-19: Secondary | ICD-10-CM

## 2023-03-25 DIAGNOSIS — Z882 Allergy status to sulfonamides status: Secondary | ICD-10-CM

## 2023-03-25 DIAGNOSIS — Z881 Allergy status to other antibiotic agents status: Secondary | ICD-10-CM | POA: Diagnosis not present

## 2023-03-25 DIAGNOSIS — I5032 Chronic diastolic (congestive) heart failure: Secondary | ICD-10-CM | POA: Diagnosis present

## 2023-03-25 DIAGNOSIS — E1169 Type 2 diabetes mellitus with other specified complication: Secondary | ICD-10-CM | POA: Diagnosis present

## 2023-03-25 DIAGNOSIS — E1140 Type 2 diabetes mellitus with diabetic neuropathy, unspecified: Secondary | ICD-10-CM | POA: Diagnosis not present

## 2023-03-25 DIAGNOSIS — Z8249 Family history of ischemic heart disease and other diseases of the circulatory system: Secondary | ICD-10-CM

## 2023-03-25 DIAGNOSIS — R0689 Other abnormalities of breathing: Secondary | ICD-10-CM | POA: Diagnosis not present

## 2023-03-25 DIAGNOSIS — R918 Other nonspecific abnormal finding of lung field: Secondary | ICD-10-CM | POA: Diagnosis not present

## 2023-03-25 DIAGNOSIS — M069 Rheumatoid arthritis, unspecified: Secondary | ICD-10-CM | POA: Diagnosis present

## 2023-03-25 DIAGNOSIS — J159 Unspecified bacterial pneumonia: Secondary | ICD-10-CM | POA: Diagnosis not present

## 2023-03-25 DIAGNOSIS — Z7984 Long term (current) use of oral hypoglycemic drugs: Secondary | ICD-10-CM

## 2023-03-25 DIAGNOSIS — I110 Hypertensive heart disease with heart failure: Secondary | ICD-10-CM | POA: Diagnosis not present

## 2023-03-25 DIAGNOSIS — G40909 Epilepsy, unspecified, not intractable, without status epilepticus: Secondary | ICD-10-CM | POA: Diagnosis present

## 2023-03-25 DIAGNOSIS — R55 Syncope and collapse: Secondary | ICD-10-CM | POA: Diagnosis not present

## 2023-03-25 DIAGNOSIS — H9190 Unspecified hearing loss, unspecified ear: Secondary | ICD-10-CM | POA: Diagnosis present

## 2023-03-25 DIAGNOSIS — I3139 Other pericardial effusion (noninflammatory): Secondary | ICD-10-CM | POA: Diagnosis not present

## 2023-03-25 DIAGNOSIS — E785 Hyperlipidemia, unspecified: Secondary | ICD-10-CM | POA: Diagnosis not present

## 2023-03-25 DIAGNOSIS — J441 Chronic obstructive pulmonary disease with (acute) exacerbation: Principal | ICD-10-CM | POA: Diagnosis present

## 2023-03-25 DIAGNOSIS — G8929 Other chronic pain: Secondary | ICD-10-CM | POA: Diagnosis present

## 2023-03-25 DIAGNOSIS — Z803 Family history of malignant neoplasm of breast: Secondary | ICD-10-CM

## 2023-03-25 DIAGNOSIS — G40919 Epilepsy, unspecified, intractable, without status epilepticus: Secondary | ICD-10-CM

## 2023-03-25 DIAGNOSIS — Z833 Family history of diabetes mellitus: Secondary | ICD-10-CM

## 2023-03-25 DIAGNOSIS — M542 Cervicalgia: Secondary | ICD-10-CM | POA: Diagnosis present

## 2023-03-25 DIAGNOSIS — Z885 Allergy status to narcotic agent status: Secondary | ICD-10-CM | POA: Diagnosis not present

## 2023-03-25 DIAGNOSIS — Z8261 Family history of arthritis: Secondary | ICD-10-CM

## 2023-03-25 DIAGNOSIS — J449 Chronic obstructive pulmonary disease, unspecified: Secondary | ICD-10-CM | POA: Diagnosis present

## 2023-03-25 DIAGNOSIS — Z880 Allergy status to penicillin: Secondary | ICD-10-CM

## 2023-03-25 DIAGNOSIS — F32A Depression, unspecified: Secondary | ICD-10-CM | POA: Diagnosis present

## 2023-03-25 DIAGNOSIS — Z7951 Long term (current) use of inhaled steroids: Secondary | ICD-10-CM

## 2023-03-25 DIAGNOSIS — J189 Pneumonia, unspecified organism: Secondary | ICD-10-CM | POA: Diagnosis present

## 2023-03-25 DIAGNOSIS — J440 Chronic obstructive pulmonary disease with acute lower respiratory infection: Secondary | ICD-10-CM | POA: Diagnosis not present

## 2023-03-25 DIAGNOSIS — R0989 Other specified symptoms and signs involving the circulatory and respiratory systems: Secondary | ICD-10-CM | POA: Diagnosis not present

## 2023-03-25 DIAGNOSIS — Z825 Family history of asthma and other chronic lower respiratory diseases: Secondary | ICD-10-CM

## 2023-03-25 DIAGNOSIS — Z7902 Long term (current) use of antithrombotics/antiplatelets: Secondary | ICD-10-CM

## 2023-03-25 DIAGNOSIS — J9621 Acute and chronic respiratory failure with hypoxia: Secondary | ICD-10-CM | POA: Diagnosis present

## 2023-03-25 DIAGNOSIS — Z720 Tobacco use: Principal | ICD-10-CM

## 2023-03-25 DIAGNOSIS — Z818 Family history of other mental and behavioral disorders: Secondary | ICD-10-CM

## 2023-03-25 DIAGNOSIS — K7460 Unspecified cirrhosis of liver: Secondary | ICD-10-CM | POA: Diagnosis present

## 2023-03-25 DIAGNOSIS — Z888 Allergy status to other drugs, medicaments and biological substances status: Secondary | ICD-10-CM | POA: Diagnosis not present

## 2023-03-25 DIAGNOSIS — Z886 Allergy status to analgesic agent status: Secondary | ICD-10-CM

## 2023-03-25 DIAGNOSIS — Z8673 Personal history of transient ischemic attack (TIA), and cerebral infarction without residual deficits: Secondary | ICD-10-CM

## 2023-03-25 DIAGNOSIS — Z83438 Family history of other disorder of lipoprotein metabolism and other lipidemia: Secondary | ICD-10-CM

## 2023-03-25 DIAGNOSIS — R0602 Shortness of breath: Secondary | ICD-10-CM | POA: Diagnosis not present

## 2023-03-25 DIAGNOSIS — G4733 Obstructive sleep apnea (adult) (pediatric): Secondary | ICD-10-CM | POA: Diagnosis present

## 2023-03-25 DIAGNOSIS — K219 Gastro-esophageal reflux disease without esophagitis: Secondary | ICD-10-CM | POA: Diagnosis present

## 2023-03-25 DIAGNOSIS — F17210 Nicotine dependence, cigarettes, uncomplicated: Secondary | ICD-10-CM | POA: Diagnosis not present

## 2023-03-25 DIAGNOSIS — R062 Wheezing: Secondary | ICD-10-CM | POA: Diagnosis not present

## 2023-03-25 DIAGNOSIS — Z96611 Presence of right artificial shoulder joint: Secondary | ICD-10-CM | POA: Diagnosis present

## 2023-03-25 DIAGNOSIS — J984 Other disorders of lung: Secondary | ICD-10-CM | POA: Diagnosis not present

## 2023-03-25 DIAGNOSIS — R569 Unspecified convulsions: Secondary | ICD-10-CM | POA: Diagnosis not present

## 2023-03-25 DIAGNOSIS — Z801 Family history of malignant neoplasm of trachea, bronchus and lung: Secondary | ICD-10-CM

## 2023-03-25 DIAGNOSIS — R0902 Hypoxemia: Secondary | ICD-10-CM | POA: Diagnosis not present

## 2023-03-25 LAB — CBC WITH DIFFERENTIAL/PLATELET
Abs Immature Granulocytes: 0.03 10*3/uL (ref 0.00–0.07)
Basophils Absolute: 0 10*3/uL (ref 0.0–0.1)
Basophils Relative: 0 %
Eosinophils Absolute: 0.1 10*3/uL (ref 0.0–0.5)
Eosinophils Relative: 1 %
HCT: 44.7 % (ref 36.0–46.0)
Hemoglobin: 14.8 g/dL (ref 12.0–15.0)
Immature Granulocytes: 0 %
Lymphocytes Relative: 38 %
Lymphs Abs: 3.7 10*3/uL (ref 0.7–4.0)
MCH: 26.9 pg (ref 26.0–34.0)
MCHC: 33.1 g/dL (ref 30.0–36.0)
MCV: 81.1 fL (ref 80.0–100.0)
Monocytes Absolute: 0.8 10*3/uL (ref 0.1–1.0)
Monocytes Relative: 9 %
Neutro Abs: 4.9 10*3/uL (ref 1.7–7.7)
Neutrophils Relative %: 52 %
Platelets: 250 10*3/uL (ref 150–400)
RBC: 5.51 MIL/uL — ABNORMAL HIGH (ref 3.87–5.11)
RDW: 13.2 % (ref 11.5–15.5)
WBC: 9.5 10*3/uL (ref 4.0–10.5)
nRBC: 0 % (ref 0.0–0.2)

## 2023-03-25 LAB — RESP PANEL BY RT-PCR (RSV, FLU A&B, COVID)  RVPGX2
Influenza A by PCR: NEGATIVE
Influenza B by PCR: NEGATIVE
Resp Syncytial Virus by PCR: NEGATIVE
SARS Coronavirus 2 by RT PCR: NEGATIVE

## 2023-03-25 LAB — BLOOD GAS, VENOUS
Acid-Base Excess: 2 mmol/L (ref 0.0–2.0)
Bicarbonate: 26.5 mmol/L (ref 20.0–28.0)
Delivery systems: POSITIVE
O2 Saturation: 78.3 %
Patient temperature: 37
pCO2, Ven: 40 mmHg — ABNORMAL LOW (ref 44–60)
pH, Ven: 7.43 (ref 7.25–7.43)
pO2, Ven: 48 mmHg — ABNORMAL HIGH (ref 32–45)

## 2023-03-25 LAB — COMPREHENSIVE METABOLIC PANEL WITH GFR
ALT: 11 U/L (ref 0–44)
AST: 19 U/L (ref 15–41)
Albumin: 3.5 g/dL (ref 3.5–5.0)
Alkaline Phosphatase: 104 U/L (ref 38–126)
Anion gap: 12 (ref 5–15)
BUN: 15 mg/dL (ref 8–23)
CO2: 23 mmol/L (ref 22–32)
Calcium: 8.7 mg/dL — ABNORMAL LOW (ref 8.9–10.3)
Chloride: 105 mmol/L (ref 98–111)
Creatinine, Ser: 0.65 mg/dL (ref 0.44–1.00)
GFR, Estimated: >60 mL/min (ref >60)
Glucose, Bld: 179 mg/dL — ABNORMAL HIGH (ref 70–99)
Potassium: 3.6 mmol/L (ref 3.5–5.1)
Sodium: 140 mmol/L (ref 135–145)
Total Bilirubin: 0.9 mg/dL (ref 0.3–1.2)
Total Protein: 7.3 g/dL (ref 6.5–8.1)

## 2023-03-25 LAB — CBG MONITORING, ED
Glucose-Capillary: 263 mg/dL — ABNORMAL HIGH (ref 70–99)
Glucose-Capillary: 345 mg/dL — ABNORMAL HIGH (ref 70–99)

## 2023-03-25 LAB — PROCALCITONIN: Procalcitonin: <0.1 ng/mL

## 2023-03-25 LAB — GLUCOSE, CAPILLARY: Glucose-Capillary: 309 mg/dL — ABNORMAL HIGH (ref 70–99)

## 2023-03-25 LAB — LACTIC ACID, PLASMA: Lactic Acid, Venous: 1.6 mmol/L (ref 0.5–1.9)

## 2023-03-25 MED ORDER — SODIUM CHLORIDE 0.9 % IV SOLN
150.0000 mg | INTRAVENOUS | Status: AC
  Administered 2023-03-25: 150 mg via INTRAVENOUS
  Filled 2023-03-25: qty 15

## 2023-03-25 MED ORDER — ACETAMINOPHEN 650 MG RE SUPP: 650.0000 mg | Freq: Four times a day (QID) | RECTAL | Status: DC | PRN

## 2023-03-25 MED ORDER — ENOXAPARIN SODIUM 40 MG/0.4ML IJ SOSY
40.0000 mg | PREFILLED_SYRINGE | INTRAMUSCULAR | Status: DC
  Administered 2023-03-25 – 2023-03-28 (×4): 40 mg via SUBCUTANEOUS
  Filled 2023-03-25 (×4): qty 0.4

## 2023-03-25 MED ORDER — LOSARTAN POTASSIUM 50 MG PO TABS
100.0000 mg | ORAL_TABLET | Freq: Every day | ORAL | Status: DC
  Administered 2023-03-25 – 2023-03-29 (×5): 100 mg via ORAL
  Filled 2023-03-25 (×5): qty 2

## 2023-03-25 MED ORDER — METHYLPREDNISOLONE SODIUM SUCC 40 MG IJ SOLR: 40.0000 mg | Freq: Two times a day (BID) | INTRAMUSCULAR | Status: DC

## 2023-03-25 MED ORDER — POTASSIUM CHLORIDE CRYS ER 20 MEQ PO TBCR
20.0000 meq | EXTENDED_RELEASE_TABLET | Freq: Every day | ORAL | Status: DC
  Administered 2023-03-25 – 2023-03-27 (×3): 20 meq via ORAL
  Filled 2023-03-25 (×3): qty 1

## 2023-03-25 MED ORDER — LORAZEPAM 2 MG/ML IJ SOLN
1.0000 mg | Freq: Once | INTRAMUSCULAR | Status: AC
  Administered 2023-03-25: 1 mg via INTRAVENOUS
  Filled 2023-03-25: qty 1

## 2023-03-25 MED ORDER — CITALOPRAM HYDROBROMIDE 20 MG PO TABS
10.0000 mg | ORAL_TABLET | Freq: Every day | ORAL | Status: DC
  Administered 2023-03-25 – 2023-03-29 (×5): 10 mg via ORAL
  Filled 2023-03-25 (×5): qty 1

## 2023-03-25 MED ORDER — SODIUM CHLORIDE 0.9 % IV SOLN: 500.0000 mg | INTRAVENOUS | Status: DC

## 2023-03-25 MED ORDER — ONDANSETRON HCL 4 MG PO TABS
4.0000 mg | ORAL_TABLET | Freq: Four times a day (QID) | ORAL | Status: DC | PRN
  Administered 2023-03-29: 4 mg via ORAL
  Filled 2023-03-25: qty 1

## 2023-03-25 MED ORDER — ALBUTEROL SULFATE (2.5 MG/3ML) 0.083% IN NEBU
5.0000 mg | INHALATION_SOLUTION | Freq: Once | RESPIRATORY_TRACT | Status: AC
  Administered 2023-03-25: 5 mg via RESPIRATORY_TRACT
  Filled 2023-03-25: qty 6

## 2023-03-25 MED ORDER — ACETAMINOPHEN 325 MG PO TABS
650.0000 mg | ORAL_TABLET | Freq: Four times a day (QID) | ORAL | Status: DC | PRN
  Administered 2023-03-25 – 2023-03-26 (×2): 650 mg via ORAL
  Filled 2023-03-25 (×2): qty 2

## 2023-03-25 MED ORDER — FUROSEMIDE 40 MG PO TABS
40.0000 mg | ORAL_TABLET | Freq: Every day | ORAL | Status: DC
  Administered 2023-03-25 – 2023-03-29 (×5): 40 mg via ORAL
  Filled 2023-03-25 (×5): qty 1

## 2023-03-25 MED ORDER — MORPHINE SULFATE (PF) 2 MG/ML IV SOLN
2.0000 mg | INTRAVENOUS | Status: DC | PRN
  Administered 2023-03-25: 2 mg via INTRAVENOUS
  Filled 2023-03-25: qty 1

## 2023-03-25 MED ORDER — FLUTICASONE FUROATE-VILANTEROL 100-25 MCG/ACT IN AEPB
1.0000 | INHALATION_SPRAY | Freq: Every day | RESPIRATORY_TRACT | Status: DC
  Administered 2023-03-26 – 2023-03-29 (×4): 1 via RESPIRATORY_TRACT
  Filled 2023-03-25 (×2): qty 28

## 2023-03-25 MED ORDER — SODIUM CHLORIDE 0.9 % IV SOLN
100.0000 mg | Freq: Once | INTRAVENOUS | Status: AC
  Administered 2023-03-25: 100 mg via INTRAVENOUS
  Filled 2023-03-25: qty 100

## 2023-03-25 MED ORDER — SODIUM CHLORIDE 0.9 % IV SOLN
2.0000 g | Freq: Once | INTRAVENOUS | Status: AC
  Administered 2023-03-25: 2 g via INTRAVENOUS
  Filled 2023-03-25: qty 12.5

## 2023-03-25 MED ORDER — SPIRONOLACTONE 25 MG PO TABS
25.0000 mg | ORAL_TABLET | Freq: Every morning | ORAL | Status: DC
  Administered 2023-03-25 – 2023-03-29 (×5): 25 mg via ORAL
  Filled 2023-03-25 (×5): qty 1

## 2023-03-25 MED ORDER — LACOSAMIDE 50 MG PO TABS
150.0000 mg | ORAL_TABLET | Freq: Two times a day (BID) | ORAL | Status: DC
  Administered 2023-03-25 – 2023-03-29 (×8): 150 mg via ORAL
  Filled 2023-03-25 (×9): qty 3

## 2023-03-25 MED ORDER — MAGNESIUM SULFATE 2 GM/50ML IV SOLN
2.0000 g | INTRAVENOUS | Status: AC
  Administered 2023-03-25: 2 g via INTRAVENOUS
  Filled 2023-03-25: qty 50

## 2023-03-25 MED ORDER — LORAZEPAM 2 MG/ML IJ SOLN: 2.0000 mg | Freq: Once | INTRAMUSCULAR | Status: DC

## 2023-03-25 MED ORDER — PANTOPRAZOLE SODIUM 40 MG PO TBEC
40.0000 mg | DELAYED_RELEASE_TABLET | Freq: Every day | ORAL | Status: DC
  Administered 2023-03-25 – 2023-03-29 (×5): 40 mg via ORAL
  Filled 2023-03-25 (×5): qty 1

## 2023-03-25 MED ORDER — ONDANSETRON HCL 4 MG/2ML IJ SOLN: 4.0000 mg | Freq: Four times a day (QID) | INTRAMUSCULAR | Status: DC | PRN

## 2023-03-25 MED ORDER — METOPROLOL SUCCINATE ER 25 MG PO TB24
25.0000 mg | ORAL_TABLET | Freq: Every day | ORAL | Status: DC
  Administered 2023-03-25 – 2023-03-29 (×5): 25 mg via ORAL
  Filled 2023-03-25 (×5): qty 1

## 2023-03-25 MED ORDER — MONTELUKAST SODIUM 10 MG PO TABS
10.0000 mg | ORAL_TABLET | Freq: Every day | ORAL | Status: DC
  Administered 2023-03-25 – 2023-03-28 (×4): 10 mg via ORAL
  Filled 2023-03-25 (×4): qty 1

## 2023-03-25 MED ORDER — ROSUVASTATIN CALCIUM 10 MG PO TABS
10.0000 mg | ORAL_TABLET | Freq: Every day | ORAL | Status: DC
  Administered 2023-03-25 – 2023-03-28 (×4): 10 mg via ORAL
  Filled 2023-03-25 (×4): qty 1

## 2023-03-25 MED ORDER — GABAPENTIN 100 MG PO CAPS
100.0000 mg | ORAL_CAPSULE | Freq: Every day | ORAL | Status: DC
  Administered 2023-03-25 – 2023-03-28 (×4): 100 mg via ORAL
  Filled 2023-03-25 (×4): qty 1

## 2023-03-25 MED ORDER — SODIUM CHLORIDE 0.9 % IV SOLN
500.0000 mg | Freq: Once | INTRAVENOUS | Status: AC
  Administered 2023-03-25: 500 mg via INTRAVENOUS
  Filled 2023-03-25: qty 5

## 2023-03-25 MED ORDER — METHYLPREDNISOLONE SODIUM SUCC 125 MG IJ SOLR: 80.0000 mg | INTRAMUSCULAR | Status: DC

## 2023-03-25 MED ORDER — SODIUM CHLORIDE 0.9 % IV BOLUS (SEPSIS)
1000.0000 mL | Freq: Once | INTRAVENOUS | Status: AC
  Administered 2023-03-25: 1000 mL via INTRAVENOUS

## 2023-03-25 MED ORDER — INSULIN ASPART 100 UNIT/ML IJ SOLN
10.0000 [IU] | Freq: Once | INTRAMUSCULAR | Status: AC
  Administered 2023-03-25: 10 [IU] via SUBCUTANEOUS
  Filled 2023-03-25: qty 1

## 2023-03-25 MED ORDER — SODIUM CHLORIDE 0.9 % IV SOLN
100.0000 mg | Freq: Two times a day (BID) | INTRAVENOUS | Status: DC
  Administered 2023-03-26: 100 mg via INTRAVENOUS
  Filled 2023-03-25 (×3): qty 100

## 2023-03-25 MED ORDER — INSULIN ASPART 100 UNIT/ML IJ SOLN
0.0000 [IU] | Freq: Three times a day (TID) | INTRAMUSCULAR | Status: DC
  Administered 2023-03-25: 11 [IU] via SUBCUTANEOUS
  Administered 2023-03-25 – 2023-03-26 (×2): 8 [IU] via SUBCUTANEOUS
  Administered 2023-03-26: 3 [IU] via SUBCUTANEOUS
  Administered 2023-03-27: 2 [IU] via SUBCUTANEOUS
  Administered 2023-03-28: 8 [IU] via SUBCUTANEOUS
  Filled 2023-03-25 (×7): qty 1

## 2023-03-25 MED ORDER — IPRATROPIUM-ALBUTEROL 0.5-2.5 (3) MG/3ML IN SOLN
3.0000 mL | RESPIRATORY_TRACT | Status: DC
  Administered 2023-03-25 – 2023-03-26 (×6): 3 mL via RESPIRATORY_TRACT
  Filled 2023-03-25 (×6): qty 3

## 2023-03-25 MED ORDER — CLOPIDOGREL BISULFATE 75 MG PO TABS
75.0000 mg | ORAL_TABLET | Freq: Every day | ORAL | Status: DC
  Administered 2023-03-25 – 2023-03-29 (×5): 75 mg via ORAL
  Filled 2023-03-25 (×5): qty 1

## 2023-03-25 MED ORDER — SODIUM CHLORIDE 0.9 % IV SOLN
2.0000 g | INTRAVENOUS | Status: AC
  Administered 2023-03-25 – 2023-03-29 (×5): 2 g via INTRAVENOUS
  Filled 2023-03-25 (×5): qty 20

## 2023-03-25 NOTE — ED Provider Notes (Signed)
[facility] Provider Note    Event Date/Time   First MD Initiated Contact with Patient 03/25/23 [phone]     (approximate)   History   Chief Complaint: Respiratory Distress   HPI  [name] is a 72 y.o. female with a history of deafness, CHF, diabetes, hypertension, COPD on 3 L nasal cannula at all times, cirrhosis, seizure disorder who comes ED complaining of worsening shortness of breath for the last few days.  EMS report the patient saw her pulmonologist yesterday was diagnosed with pneumonia.  However, she has continued to worsen overnight.  This morning they report her oxygen saturation was 85% on 6 L nasal cannula, they put her on CPAP which increased her oxygenation to 92%.  Patient denies pain.     Physical Exam   Triage Vital Signs: ED Triage Vitals  Encounter Vitals Group     BP 03/25/23 0749 (!) 153/102     Systolic BP Percentile --      Diastolic BP Percentile --      Pulse Rate 03/25/23 0749 90     Resp 03/25/23 0749 (!) 32     Temp --      Temp src --      SpO2 03/25/23 0748 92 %     Weight --      Height --      Head Circumference --      Peak Flow --      Pain Score --      Pain Loc --      Pain Education --      Exclude from Growth Chart --     Most recent vital signs: Vitals:   03/25/23 0930 03/25/23 1000  BP: (!) 143/62 (!) 132/55  Pulse: 80 79  Resp: (!) 28 (!) 26  Temp:    SpO2: 93% 92%    General: Awake, moderate respiratory distress CV:  Good peripheral perfusion.  Regular rate and rhythm Resp:  Tachypnea and accessory muscle use.  Diminished breath sounds on the left.  Diffuse expiratory wheezing. Abd:  No distention.  Soft nontender Other:  No lower extremity edema or calf tenderness.   ED Results / Procedures / Treatments   Labs (all labs ordered are listed, but only abnormal results are displayed) Labs Reviewed  COMPREHENSIVE METABOLIC PANEL - Abnormal; Notable for the following components:       Result Value   Glucose, Bld 179 (*)    Calcium 8.7 (*)    All other components within normal limits  CBC WITH DIFFERENTIAL/PLATELET - Abnormal; Notable for the following components:   RBC 5.51 (*)    All other components within normal limits  BLOOD GAS, VENOUS - Abnormal; Notable for the following components:   pCO2, Ven 40 (*)    pO2, Ven 48 (*)    All other components within normal limits  RESP PANEL BY RT-PCR (RSV, FLU A&B, COVID)  [id number]  CULTURE, BLOOD (ROUTINE X 2)  CULTURE, BLOOD (ROUTINE X 2)  LACTIC ACID, PLASMA  PROCALCITONIN  URINALYSIS, W/ REFLEX TO CULTURE (INFECTION SUSPECTED)     EKG Interpreted by me Sinus rhythm rate of 91.  Normal axis, normal intervals.  Normal QRS ST segments and T waves.  Repeat EKG shows sinus rhythm rate of 85.  No acute ischemic changes.   RADIOLOGY Chest x-ray interpreted by me, shows multifocal opacity in left lung concerning for pneumonia.  Radiology report reviewed   PROCEDURES:  .Critical Care  Performed by: Sharman Cheek, MD Authorized by: Sharman Cheek, MD   Critical care provider statement:    Critical care time (minutes):  45   Critical care time was exclusive of:  Separately billable procedures and treating other patients   Critical care was necessary to treat or prevent imminent or life-threatening deterioration of the following conditions:  Respiratory failure   Critical care was time spent personally by me on the following activities:  Development of treatment plan with patient or surrogate, discussions with consultants, evaluation of patient's response to treatment, examination of patient, obtaining history from patient or surrogate, ordering and performing treatments and interventions, ordering and review of laboratory studies, ordering and review of radiographic studies, pulse oximetry, re-evaluation of patient's condition and review of old charts   Care discussed with: admitting provider      MEDICATIONS  ORDERED IN ED: Medications  doxycycline (VIBRAMYCIN) 100 mg in sodium chloride 0.9 % 250 mL IVPB (100 mg Intravenous New Bag/Given 03/25/23 1010)  sodium chloride 0.9 % bolus 1,000 mL (0 mLs Intravenous Stopped 03/25/23 0955)  ceFEPIme (MAXIPIME) 2 g in sodium chloride 0.9 % 100 mL IVPB (0 g Intravenous Stopped 03/25/23 0847)  azithromycin (ZITHROMAX) 500 mg in sodium chloride 0.9 % 250 mL IVPB (0 mg Intravenous Stopped 03/25/23 0913)  LORazepam (ATIVAN) injection 1 mg (1 mg Intravenous Given 03/25/23 0810)  lacosamide (VIMPAT) 150 mg in sodium chloride 0.9 % 25 mL IVPB (0 mg Intravenous Stopped 03/25/23 0856)  albuterol (PROVENTIL) (2.5 MG/3ML) 0.083% nebulizer solution 5 mg (5 mg Nebulization Given 03/25/23 0831)  magnesium sulfate IVPB 2 g 50 mL (0 g Intravenous Stopped 03/25/23 0950)     IMPRESSION / MDM / ASSESSMENT AND PLAN / ED COURSE  I reviewed the triage vital signs and the nursing notes.  DDx: COPD exacerbation, pneumonia, sepsis, pneumothorax, pleural effusion, pulmonary edema/CHF decompensation, COVID  Patient's presentation is most consistent with acute presentation with potential threat to life or bodily function.  Patient presents with respiratory distress, diminished breath sounds on the left and wheezing.  Suspect pneumonia and COPD exacerbation primarily.  No lower extremity edema, patient appears dry, less likely CHF.  Will start sepsis workup, give cefepime, continue bronchodilators.  Patient placed on BiPAP on arrival for respiratory support   Clinical Course as of 03/25/23 1029  Wed Mar 25, 2023  9147 Chest x-ray interpreted by me, shows multifocal infiltrates of the left lung consistent with pneumonia.  Radiology report reviewed [PS]  0822 Patient appears much more comfortable on BiPAP.  No accessory muscle use.  Oxygen saturation 95%. [PS]    Clinical Course User Index [PS] Sharman Cheek, MD     FINAL CLINICAL IMPRESSION(S) / ED DIAGNOSES   Final diagnoses:   COPD exacerbation (HCC)  Acute on chronic respiratory failure with hypoxia (HCC)     Rx / DC Orders   ED Discharge Orders     None        Note:  This document was prepared using Dragon voice recognition software and may include unintentional dictation errors.   Sharman Cheek, MD 03/25/23 1030

## 2023-03-25 NOTE — ED Triage Notes (Addendum)
Patient from home, diagnosed with Pneumonia yesterday and started on antibiotics; today family called EMS for increased SOB, O2 sat at home 85%. Baseline 3L. Per EMS patient had 2 seizures en route, history of the same.   CPAP Duoneb 125mg  Solumedrol

## 2023-03-25 NOTE — H&P (Signed)
History and Physical    THU TENAGLIA ZOX:[id number] DOB: 10-Dec-1950 DOA: 03/25/2023  PCP: [name], NP (Confirm with patient/family/NH records and if not entered, this has to be entered at [facility] point of entry) Patient coming from: Home  I have personally briefly reviewed patient's old medical records in [facility]  Chief Complaint: Cough, wheezing, SOB  HPI: [name] is a 72 y.o. female with medical history significant of COPD Gold stage III, chronic HFpEF, mild/moderate AS, chronic hypoxic respiratory failure on 2 L at bedtime, OSA not tolerating CPAP at bedtime, deaf, IIDM, presented with worsening of cough wheezing shortness of breath.  Patient is chronically deaf, with daughter at bedside to interpret.  For the last 1 past month patient been having several episodes of pneumonia as family reported.  When the patient has had increasing productive cough with white/yellowish sputum as well as increasing wheezing.  She was treated with antibiotics including most recent episode about 2 weeks ago.  Even after treatment however patient continued to have productive cough wheezing and shortness of breath and went back to see her pulmonary doctor yesterday and was prescribed another round of antibiotics.  However overnight patient condition deteriorated, and family called EMS.  EMS arrived and found patient O2 saturation lower 80s and patient was placed on CPAP.  Reportedly on route to ED, patient seizure activity of his bilateral legs shaking and unresponsiveness patient was given Ativan.  Patient was placed on BiPAP  ED Course: Afebrile, none tachycardia blood pressure stable, patient was given Ativan x 1 and Vimpat x 1 in the ED.  Chest x-ray showed left lower lobe infiltrates.  Blood work showed WBC 9.5, hemoglobin 14.8, glucose 179, creatinine 0.6 BUN 15, VBG showed 7 point 4/40/48  Patient was given vancomycin cefepime and azithromycin breathing treatment  Review of Systems: As  per HPI otherwise 14 point review of systems negative.    Past Medical History:  Diagnosis Date   Asthma    CHF (congestive heart failure) (HCC)    Cirrhosis, non-alcoholic (HCC)    COPD (chronic obstructive pulmonary disease) (HCC)    Deaf    Depression    Diabetes mellitus without complication (HCC)    GERD (gastroesophageal reflux disease)    Heart murmur    Hepatitis    History of rheumatic fever    History of scarlet fever    Hypertension    IBS (irritable bowel syndrome)    Lymph node disorder    arm   Neuropathy    On home oxygen therapy    hs   Orthopnea    Osteoarthritis    RA (rheumatoid arthritis) (HCC)    RLS (restless legs syndrome)    Seizures (HCC)    Shortness of breath dyspnea    Sleep apnea    Stroke (HCC)    tia    Past Surgical History:  Procedure Laterality Date   ABDOMINAL HYSTERECTOMY     BREAST BIOPSY Right 10/30/2020   Stereo Bx, X-clip,  BENIGN BREAST TISSUE WITH FIBROADENOMATOUS   CATARACT EXTRACTION W/PHACO Right 11/23/2014   Procedure: CATARACT EXTRACTION PHACO AND INTRAOCULAR LENS PLACEMENT (IOC);  Surgeon: Lia Hopping, MD;  Location: [facility] ORS;  Service: Ophthalmology;  Laterality: Right;   CATARACT EXTRACTION W/PHACO Left 12/14/2014   Procedure: CATARACT EXTRACTION PHACO AND INTRAOCULAR LENS PLACEMENT (IOC);  Surgeon: Lia Hopping, MD;  Location: [facility] ORS;  Service: Ophthalmology;  Laterality: Left;  US:01:16.6 AP:15.8 CDE:12.14   CESAREAN SECTION  CHOLECYSTECTOMY     COLONOSCOPY WITH PROPOFOL N/A 10/08/2020   Procedure: COLONOSCOPY WITH PROPOFOL;  Surgeon: Toledo, Boykin Nearing, MD;  Location: [facility] END[facility];  Service: Gastroenterology;  Laterality: N/A;   ESOPHAGOGASTRODUODENOSCOPY (EGD) WITH PROPOFOL N/A 10/08/2020   Procedure: ESOPHAGOGASTRODUODENOSCOPY (EGD) WITH PROPOFOL;  Surgeon: Toledo, Boykin Nearing, MD;  Location: [facility];  Service: Gastroenterology;  Laterality: N/A;  DM DEAF, NEEDS SIGN INTERPRETER PER SON    REVERSE SHOULDER ARTHROPLASTY Right 12/21/2019   Procedure: REVERSE SHOULDER ARTHROPLASTY;  Surgeon: Lyndle Herrlich, MD;  Location: [facility];  Service: Orthopedics;  Laterality: Right;   THUMB ARTHROSCOPY     TONSILLECTOMY     TYMPANOPLASTY     muliple     reports that she has been smoking cigarettes. She started smoking about 55 years ago. She has a 27.5 pack-year smoking history. She has never used smokeless tobacco. She reports that she does not drink alcohol and does not use drugs.  Allergies  Allergen Reactions   Celebrex [Celecoxib] Itching    itching   Ciprofloxacin Itching   Codeine Itching   Fosphenytoin Itching and Other (See Comments)   Levaquin [Levofloxacin In D5w] Itching   Levofloxacin Itching   Lovastatin Itching   Pravastatin Itching   Sulfa Antibiotics Itching   Aspirin Itching and Rash   Azithromycin Rash   Penicillins Rash    Documentation indicates severe reaction  Pt tolerated cephalosporin without adverse reaction 09/18     Family History  Problem Relation Age of Onset   Lung cancer Mother    CAD Father    Miscarriages / Stillbirths Sister    Hyperlipidemia Sister    Hypertension Sister    Diabetes Sister    COPD Sister    Asthma Sister    Arthritis Sister    Breast cancer Sister    Heart attack Sister    Hyperlipidemia Sister    Heart disease Sister    Heart attack Sister    Diabetes Sister    Cancer Sister    Asthma Sister    Heart disease Brother    Heart attack Brother    Depression Brother    Hypertension Son    Hyperlipidemia Son    Heart disease Son    Depression Son    Hypertension Daughter    Depression Daughter    Asthma Daughter    Birth defects Paternal Uncle    Hearing loss Brother      Prior to Admission medications   Medication Sig Start Date End Date Taking? Authorizing Provider  citalopram (CELEXA) 10 MG tablet TAKE 1 TABLET BY MOUTH DAILY 01/16/23   [name], NP  clopidogrel (PLAVIX) 75 MG tablet TAKE 1  TABLET BY MOUTH DAILY 12/10/22   [name], NP  fluticasone furoate-vilanterol (BREO ELLIPTA) 100-25 MCG/ACT AEPB Inhale 1 puff into the lungs daily.    [provider]  furosemide (LASIX) 40 MG tablet Take 1 tablet (40 mg total) by mouth daily. 03/05/23   [name], NP  gabapentin (NEURONTIN) 100 MG capsule Take 100 mg by mouth daily in the afternoon.    [provider]  glimepiride (AMARYL) 2 MG tablet TAKE 1 TABLET BY MOUTH DAILY FOR DIABETES 02/19/23   [name], NP  ipratropium-albuterol (DUONEB) 0.5-2.5 (3) MG/3ML SOLN INHALE 1 VIAL USING NEBULIZER EVERY SIX HOURS AS NEEDED FOR SHORTNESS OF BREATH 02/19/23   [name], NP  Lacosamide (VIMPAT) 150 MG TABS Take 150 mg by mouth every 12 (twelve) hours.  [provider]  losartan (COZAAR) 100 MG tablet TAKE 1 TABLET BY MOUTH DAILY AS DIRECTED 03/17/23   [name], NP  metoprolol succinate (TOPROL-XL) 25 MG 24 hr tablet TAKE 1 TABLET BY MOUTH DAILY 03/17/23   [name], NP  montelukast (SINGULAIR) 10 MG tablet TAKE 1 TABLET BY MOUTH AT BEDTIME 12/10/22   [name], NP  pantoprazole (PROTONIX) 40 MG tablet TAKE 1 TABLET BY MOUTH DAILY FOR REFLUX/HEARTBURN 12/10/22   [name], NP  pantoprazole sodium (PROTONIX) 40 mg Take 40 mg by mouth daily. 04/07/22   Lonia Blood, MD  potassium chloride (KLOR-CON M) 10 MEQ tablet Take 20 mEq by mouth daily.    [provider]  rosuvastatin (CRESTOR) 10 MG tablet TAKE 1 TABLET BY MOUTH AT BEDTIME 03/24/23   [name], NP  spironolactone (ALDACTONE) 25 MG tablet TAKE 1 TABLET BY MOUTH EVERY MORNING 01/29/23   [name], NP    Physical Exam: Vitals:   03/25/23 0912 03/25/23 0930 03/25/23 1000 03/25/23 1110  BP: 134/71 (!) 143/62 (!) 132/55   Pulse: 88 80 79   Resp: 17 (!) 28 (!) 26   Temp:      TempSrc:      SpO2: 92% 93% 92% 92%  Weight:        Constitutional: NAD, calm, comfortable Vitals:   03/25/23 0912 03/25/23 0930 03/25/23 1000  03/25/23 1110  BP: 134/71 (!) 143/62 (!) 132/55   Pulse: 88 80 79   Resp: 17 (!) 28 (!) 26   Temp:      TempSrc:      SpO2: 92% 93% 92% 92%  Weight:       Eyes: PERRL, lids and conjunctivae normal ENMT: Mucous membranes are moist. Posterior pharynx clear of any exudate or lesions.Normal dentition.  Neck: normal, supple, no masses, no thyromegaly Respiratory: Diminished breathing sound bilaterally, diffused wheezing, scattered crackles on bilateral lower fields, increasing respiratory effort. No accessory muscle use.  Cardiovascular: Regular rate and rhythm, no murmurs / rubs / gallops. No extremity edema. 2+ pedal pulses. No carotid bruits.  Abdomen: no tenderness, no masses palpated. No hepatosplenomegaly. Bowel sounds positive.  Musculoskeletal: no clubbing / cyanosis. No joint deformity upper and lower extremities. Good ROM, no contractures. Normal muscle tone.  Skin: no rashes, lesions, ulcers. No induration Neurologic: No facial droops, moving all limbs, following simple commands Psychiatric: Lethargic, oriented to herself, confused about time and place  Labs on Admission: I have personally reviewed following labs and imaging studies  CBC: Recent Labs  Lab 03/19/23 0853 03/25/23 0757  WBC 7.9 9.5  NEUTROABS 4.7 4.9  HGB 13.7 14.8  HCT 42.6 44.7  MCV 83.4 81.1  PLT 256.0 250   Basic Metabolic Panel: Recent Labs  Lab 03/19/23 0853 03/25/23 0757  NA 141 140  K 3.7 3.6  CL 105 105  CO2 27 23  GLUCOSE 121* 179*  BUN 18 15  CREATININE 0.76 0.65  CALCIUM 9.3 8.7*   GFR: Estimated Creatinine Clearance: 61.7 mL/min (by C-G formula based on SCr of 0.65 mg/dL). Liver Function Tests: Recent Labs  Lab 03/19/23 0853 03/25/23 0757  AST 18 19  ALT 11 11  ALKPHOS 98 104  BILITOT 0.5 0.9  PROT 7.2 7.3  ALBUMIN 4.0 3.5   No results for input(s): "LIPASE", "AMYLASE" in the last 168 hours. No results for input(s): "AMMONIA" in the last 168 hours. Coagulation  Profile: No results for input(s): "INR", "PROTIME" in the last 168 hours. Cardiac  Enzymes: No results for input(s): "CKTOTAL", "CKMB", "CKMBINDEX", "TROPONINI" in the last 168 hours. BNP (last 3 results) No results for input(s): "PROBNP" in the last 8760 hours. HbA1C: No results for input(s): "HGBA1C" in the last 72 hours. CBG: No results for input(s): "GLUCAP" in the last 168 hours. Lipid Profile: No results for input(s): "CHOL", "HDL", "LDLCALC", "TRIG", "CHOLHDL", "LDLDIRECT" in the last 72 hours. Thyroid Function Tests: No results for input(s): "TSH", "T4TOTAL", "FREET4", "T3FREE", "THYROIDAB" in the last 72 hours. Anemia Panel: No results for input(s): "VITAMINB12", "FOLATE", "FERRITIN", "TIBC", "IRON", "RETICCTPCT" in the last 72 hours. Urine analysis:    Component Value Date/Time   COLORURINE YELLOW (A) 03/30/2022 1402   APPEARANCEUR HAZY (A) 03/30/2022 1402   APPEARANCEUR Clear 11/14/2011 1607   LABSPEC 1.014 03/30/2022 1402   LABSPEC 1.006 11/14/2011 1607   PHURINE 5.0 03/30/2022 1402   GLUCOSEU NEGATIVE 03/30/2022 1402   GLUCOSEU Negative 11/14/2011 1607   HGBUR NEGATIVE 03/30/2022 1402   BILIRUBINUR NEGATIVE 03/30/2022 1402   BILIRUBINUR Negative 11/14/2011 1607   KETONESUR NEGATIVE 03/30/2022 1402   PROTEINUR NEGATIVE 03/30/2022 1402   NITRITE POSITIVE (A) 03/30/2022 1402   LEUKOCYTESUR NEGATIVE 03/30/2022 1402   LEUKOCYTESUR Negative 11/14/2011 1607    Radiological Exams on Admission: DG Chest Port 1 View  Result Date: 03/25/2023 CLINICAL DATA:  Increased shortness of breath. Diagnosed with pneumonia yesterday and started on antibiotics. EXAM: PORTABLE CHEST 1 VIEW COMPARISON:  Chest radiograph 02/24/2023. FINDINGS: Central pulmonary venous congestion without overt pulmonary edema. Retrocardiac opacity is favored to reflect atelectasis given leftward mediastinal shift. No pleural effusion or pneumothorax. IMPRESSION: 1. Central pulmonary venous congestion without  overt pulmonary edema. 2. Retrocardiac opacity is favored to reflect atelectasis given leftward mediastinal shift. Electronically Signed   By: Orvan Falconer M.D.   On: 03/25/2023 08:34    EKG: Independently reviewed.  Sinus rhythm, no acute ST changes.  Assessment/Plan Principal Problem:   Pneumonia Active Problems:   Seizure disorder [facility])   Essential hypertension   COPD (chronic obstructive pulmonary disease) (HCC)   Community acquired pneumonia of left lower lobe of lung  (please populate well all problems here in Problem List. (For example, if patient is on BP meds at home and you resume or decide to hold them, it is a problem that needs to be her. Same for CAD, COPD, HLD and so on)  Acute on chronic hypoxic respiratory failure -Likely secondary to combination effect of COPD exacerbation and community-acquired pneumonia failed outpatient management -Treat COPD exacerbation IV Solu-Medrol, breathing treatment guaifenesin incentive spirometry -BiPAP was able to weaned off in the ED and patient on 8 L high flow oxygen for now, will admit to PCU for close monitoring -Will treat community-acquired pneumonia with ceftriaxone and azithromycin, check a sputum culture and strep antigen and atypical pneumonia study -Full code as per family -Discussed with patient and her family at bedside regarding risk of aspiration, appears that the patient does have increased risk of aspiration.  Also given that the patient has had multiple episodes of pneumonia since summer, will check CT chest.  Consult speech evaluation  Acute COPD exacerbation -As above  Community-acquired pneumonia, bacterial -Outpatient management, antibiotics as above  Question of breakthrough seizure versus nonepileptic seizure -No direct evidence of seizure activity en route to hospital.  Family reported that patient had 1 episode of seizure in the ED, nurse went to see the patient and found the patient nodding her head with  twitching of bilateral feet but patient remained conscious.  Case was discussed with on-call neurology Dr. Wilford Corner, who is familiar with the patient as outpatient and he recommended no change of current antiseizure regimen.  Her Keppra was recently discontinued due to worsening of lethargy.  Chronic HFpEF Mild-moderate AS -Euvolemic, continue p.o. Lasix -Continue metoprolol, losartan and spironolactone  IIDM -SSI for now.  DVT prophylaxis: Lovenox Code Status: Full code Family Communication: Daughter at bedside Disposition Plan: Patient is sick with recurrent pneumonia failed outpatient management requiring IV antibiotics and COPD exacerbation requiring IV Solu-Medrol, expect more than 2 midnight hospital stay Consults called: None Admission status: PCU   Emeline General MD [facility] Pager [phone]  03/25/2023, 11:20 AM

## 2023-03-25 NOTE — ED Notes (Signed)
Pillow provided to patient for comfort.

## 2023-03-25 NOTE — ED Notes (Signed)
RT at bedside placing patient on 8L HFNC. Patient requesting water, will provide to patient.

## 2023-03-25 NOTE — Progress Notes (Signed)
PHARMACY -  BRIEF ANTIBIOTIC NOTE   Pharmacy has received consult(s) for CAP from an ED provider.  The patient's profile has been reviewed for ht/wt/allergies/indication/available labs.    One time order(s) placed for cefepime  Further antibiotics/pharmacy consults should be ordered by admitting physician if indicated.                       Thank you, Rockwell Alexandria 03/25/2023  7:56 AM

## 2023-03-25 NOTE — ED Notes (Signed)
Patient's daughter came to nurses station reporting patient complaining of itching. This RN entered the room and patient has localized rash to left forearm and wrist. Azithromycin infusing into 20G in left forearm stopped, MD Scotty Court made aware.

## 2023-03-25 NOTE — ED Notes (Signed)
Patient's daughter called out stating her mother was having neck pain; will medicate per [facility].

## 2023-03-25 NOTE — ED Notes (Addendum)
PO medications were ordered; Dr. Chipper Herb was contacted regarding possible trial off bipap in order for medications to be given. Dr. Chipper Herb gave verbal to call RT and place patient on HFNC. If patient tolerates, will medicate per MAR.

## 2023-03-25 NOTE — Progress Notes (Signed)
Elink is following code sepsis.

## 2023-03-26 ENCOUNTER — Other Ambulatory Visit: Payer: Self-pay | Admitting: Nurse Practitioner

## 2023-03-26 ENCOUNTER — Ambulatory Visit: Payer: 59

## 2023-03-26 ENCOUNTER — Inpatient Hospital Stay: Payer: 59

## 2023-03-26 DIAGNOSIS — J441 Chronic obstructive pulmonary disease with (acute) exacerbation: Secondary | ICD-10-CM

## 2023-03-26 DIAGNOSIS — J9621 Acute and chronic respiratory failure with hypoxia: Secondary | ICD-10-CM | POA: Diagnosis not present

## 2023-03-26 DIAGNOSIS — J189 Pneumonia, unspecified organism: Secondary | ICD-10-CM | POA: Diagnosis not present

## 2023-03-26 DIAGNOSIS — G40909 Epilepsy, unspecified, not intractable, without status epilepticus: Secondary | ICD-10-CM

## 2023-03-26 DIAGNOSIS — J439 Emphysema, unspecified: Secondary | ICD-10-CM

## 2023-03-26 LAB — URINALYSIS, W/ REFLEX TO CULTURE (INFECTION SUSPECTED)
Bilirubin Urine: NEGATIVE
Glucose, UA: NEGATIVE mg/dL
Hgb urine dipstick: NEGATIVE
Ketones, ur: NEGATIVE mg/dL
Leukocytes,Ua: NEGATIVE
Nitrite: NEGATIVE
Protein, ur: NEGATIVE mg/dL
Specific Gravity, Urine: 1.018 (ref 1.005–1.030)
pH: 6 (ref 5.0–8.0)

## 2023-03-26 LAB — GLUCOSE, CAPILLARY
Glucose-Capillary: 80 mg/dL (ref 70–99)
Glucose-Capillary: 158 mg/dL — ABNORMAL HIGH (ref 70–99)
Glucose-Capillary: 273 mg/dL — ABNORMAL HIGH (ref 70–99)
Glucose-Capillary: 214 mg/dL — ABNORMAL HIGH (ref 70–99)

## 2023-03-26 LAB — STREP PNEUMONIAE URINARY ANTIGEN: Strep Pneumo Urinary Antigen: NEGATIVE

## 2023-03-26 MED ORDER — IOHEXOL 300 MG/ML  SOLN: 75.0000 mL | Freq: Once | INTRAMUSCULAR | Status: DC | PRN

## 2023-03-26 MED ORDER — METHYLPREDNISOLONE SODIUM SUCC 125 MG IJ SOLR
60.0000 mg | INTRAMUSCULAR | Status: DC
  Administered 2023-03-26: 60 mg via INTRAVENOUS
  Filled 2023-03-26: qty 2

## 2023-03-26 MED ORDER — DOXYCYCLINE HYCLATE 100 MG PO TABS
100.0000 mg | ORAL_TABLET | Freq: Two times a day (BID) | ORAL | Status: DC
  Administered 2023-03-26 – 2023-03-29 (×7): 100 mg via ORAL
  Filled 2023-03-26 (×7): qty 1

## 2023-03-26 MED ORDER — IPRATROPIUM-ALBUTEROL 0.5-2.5 (3) MG/3ML IN SOLN
3.0000 mL | Freq: Four times a day (QID) | RESPIRATORY_TRACT | Status: DC
  Administered 2023-03-26 – 2023-03-27 (×5): 3 mL via RESPIRATORY_TRACT
  Filled 2023-03-26: qty 3
  Filled 2023-03-26: qty 6
  Filled 2023-03-26 (×2): qty 3

## 2023-03-26 NOTE — Progress Notes (Signed)
[facility]  - [place] [facility]   PATIENT NAME: [name]    MR#:  [id number]  DATE OF BIRTH:  11/11/1950  SUBJECTIVE:  daughter at bedside. Patient is deaf however does understand and read lips. Came in with increasing shortness of breath and some cough. Was recently started on antibiotic and prednisone as outpatient by pulmonary. recently finished course of antibiotic prior to that. Increasing shortness of breath. Continues to smoke.    VITALS:  Blood pressure (!) 129/53, pulse 64, temperature 97.6 F (36.4 C), resp. rate 16, weight 72.8 kg, SpO2 95%.  PHYSICAL EXAMINATION:   GENERAL:  72 y.o.-year-old patient with no acute distress.Deaf  LUNGS:decreased breath sounds bilaterally, no wheezing CARDIOVASCULAR: S1, S2 normal. No murmur   ABDOMEN: Soft, nontender, nondistended. EXTREMITIES: + edema b/l.    NEUROLOGIC: nonfocal  patient is alert and awake  LABORATORY PANEL:  CBC Recent Labs  Lab 03/25/23 0757  WBC 9.5  HGB 14.8  HCT 44.7  PLT 250    Chemistries  Recent Labs  Lab 03/25/23 0757  NA 140  K 3.6  CL 105  CO2 23  GLUCOSE 179*  BUN 15  CREATININE 0.65  CALCIUM 8.7*  AST 19  ALT 11  ALKPHOS 104  BILITOT 0.9   Cardiac Enzymes No results for input(s): "TROPONINI" in the last 168 hours. RADIOLOGY:  CT CHEST WO CONTRAST  Result Date: 03/25/2023 CLINICAL DATA:  Respiratory distress EXAM: CT CHEST WITHOUT CONTRAST TECHNIQUE: Multidetector CT imaging of the chest was performed following the standard protocol without IV contrast. RADIATION DOSE REDUCTION: This exam was performed according to the departmental dose-optimization program which includes automated exposure control, adjustment of the mA and/or kV according to patient size and/or use of iterative reconstruction technique. COMPARISON:  08/15/2017 FINDINGS: Cardiovascular: Heart size normal. Small pericardial effusion, stable. 3-vessel coronary calcifications. Calcified plaque  in the aortic arch and descending thoracic segment without aneurysm. Mediastinum/Nodes: No mass or adenopathy. Leftward mediastinal shift secondary to volume loss from the left lung. Lungs/Pleura: Masslike perihilar consolidation in the left lower lung with volume loss and air bronchograms. Scattered surrounding ground-glass and airspace opacities. Dependent consolidation/atelectasis in the left lower lobe. There is dense consolidation in the lingula with some linear subsegmental atelectasis in the posterior left upper lobe. Breathing motion degrades evaluation through the hila. Hyperinflation of right lung across the midline, without focal lesion. Upper Abdomen: Cholecystectomy clips.  No acute findings. Musculoskeletal: Post right shoulder arthroplasty. No acute findings. IMPRESSION: 1. Masslike perihilar consolidation in the left lower lung with surrounding ground-glass and airspace opacities, possibly infectious/inflammatory. Consider pulmonary consultation. 2. Stable small pericardial effusion. 3. Coronary and  Aortic Atherosclerosis (ICD10-I70.0). Electronically Signed   By: Corlis Leak M.D.   On: 03/25/2023 13:08   DG Chest Port 1 View  Result Date: 03/25/2023 CLINICAL DATA:  Increased shortness of breath. Diagnosed with pneumonia yesterday and started on antibiotics. EXAM: PORTABLE CHEST 1 VIEW COMPARISON:  Chest radiograph 02/24/2023. FINDINGS: Central pulmonary venous congestion without overt pulmonary edema. Retrocardiac opacity is favored to reflect atelectasis given leftward mediastinal shift. No pleural effusion or pneumothorax. IMPRESSION: 1. Central pulmonary venous congestion without overt pulmonary edema. 2. Retrocardiac opacity is favored to reflect atelectasis given leftward mediastinal shift. Electronically Signed   By: Orvan Falconer M.D.   On: 03/25/2023 08:34    Assessment and Plan   LEETTA ELLENDER is a 72 y.o. female with medical history significant of COPD Gold stage III, chronic  HFpEF, mild/moderate  AS, chronic hypoxic respiratory failure on 2 L at bedtime, OSA not tolerating CPAP at bedtime, deaf, IIDM, presented with worsening of cough wheezing shortness of breath.  EMS arrived and found patient O2 saturation lower 80s and patient was placed on CPAP.   Acute on chronic hypoxic respiratory failure Lobar mass like consolidation -Likely secondary to combination effect of COPD exacerbation and mass like left lobar pneumonia vs malignancy  --this it pt's 3rd round of abxs -Treat COPD exacerbation IV Solu-Medrol, breathing treatment guaifenesin incentive spirometry -Wean oxygen as tolerated --seen by [facility] dr A--PET scan vs Bronch --procalcitonin negative, wbc normal --d/w dter at bedside   Acute COPD exacerbation -As above   Question of breakthrough seizure versus nonepileptic seizure -No direct evidence of seizure activity en route to hospital.  --  Case was discussed with on-call neurology Dr. Wilford Corner, who is familiar with the patient as outpatient and he recommended no change of current antiseizure regimen.  Her Keppra was recently discontinued due to worsening of lethargy.   Chronic HFpEF Mild-moderate AS -Euvolemic, continue p.o. Lasix -Continue metoprolol, losartan and spironolactone   DM-II -SSI for now.  Procedures: Family communication :dter at bedside Consults :[facility] pulm CODE STATUS: FULL DVT Prophylaxis :lovenox Level of care: Progressive Status is: Inpatient Remains inpatient appropriate because: respiratory failure    TOTAL TIME TAKING CARE OF THIS PATIENT: 35 minutes.  >50% time spent on counselling and coordination of care  Note: This dictation was prepared with Dragon dictation along with smaller phrase technology. Any transcriptional errors that result from this process are unintentional.  Enedina Finner M.D    [facility]   CC: Primary care physician; [name], NP

## 2023-03-26 NOTE — Plan of Care (Signed)
Progressing

## 2023-03-26 NOTE — Consult Note (Signed)
PULMONOLOGY         Date: 03/26/2023,   MRN# [id number] [name] [birth year]     AdmissionWeight: 72.8 kg                 CurrentWeight: 72.8 kg  Referring provider: Dr Enedina Finner   CHIEF COMPLAINT:   Cavitary lung disease on CT chest    HISTORY OF PRESENT ILLNESS   [name] is a 72 y.o. female with medical history significant of COPD Gold stage III, chronic HFpEF, mild/moderate AS, chronic hypoxic respiratory failure on 2 L at bedtime, OSA not tolerating CPAP at bedtime, deaf, IIDM, presented with worsening of cough wheezing shortness of breath.  She was seen by Dr Meredeth Ide with [facility] pulmonary and noted to have signs of Acute COPD exacerbation she was placed on antibiotics and prednisone for her respiratory symptoms. Despite this she continued to get worse and her pulmonologist recommended to seek ER evaluation.     Her son [name] is at bedside and helps due to deafness with patient.   She is on antibiotics now on CAP therapy and is on 5L/min [place].    PAST MEDICAL HISTORY   Past Medical History:  Diagnosis Date   Asthma    CHF (congestive heart failure) (HCC)    Cirrhosis, non-alcoholic (HCC)    COPD (chronic obstructive pulmonary disease) (HCC)    Deaf    Depression    Diabetes mellitus without complication (HCC)    GERD (gastroesophageal reflux disease)    Heart murmur    Hepatitis    History of rheumatic fever    History of scarlet fever    Hypertension    IBS (irritable bowel syndrome)    Lymph node disorder    arm   Neuropathy    On home oxygen therapy    hs   Orthopnea    Osteoarthritis    RA (rheumatoid arthritis) (HCC)    RLS (restless legs syndrome)    Seizures (HCC)    Shortness of breath dyspnea    Sleep apnea    Stroke (HCC)    tia     SURGICAL HISTORY   Past Surgical History:  Procedure Laterality Date   ABDOMINAL HYSTERECTOMY     BREAST BIOPSY Right 10/30/2020   Stereo Bx, X-clip,  BENIGN BREAST TISSUE WITH  FIBROADENOMATOUS   CATARACT EXTRACTION W/PHACO Right 11/23/2014   Procedure: CATARACT EXTRACTION PHACO AND INTRAOCULAR LENS PLACEMENT (IOC);  Surgeon: Lia Hopping, MD;  Location: [facility] ORS;  Service: Ophthalmology;  Laterality: Right;   CATARACT EXTRACTION W/PHACO Left 12/14/2014   Procedure: CATARACT EXTRACTION PHACO AND INTRAOCULAR LENS PLACEMENT (IOC);  Surgeon: Lia Hopping, MD;  Location: [facility];  Service: Ophthalmology;  Laterality: Left;  US:01:16.6 AP:15.8 CDE:12.14   CESAREAN SECTION     CHOLECYSTECTOMY     COLONOSCOPY WITH PROPOFOL N/A 10/08/2020   Procedure: COLONOSCOPY WITH PROPOFOL;  Surgeon: Toledo, Boykin Nearing, MD;  Location: [facility];  Service: Gastroenterology;  Laterality: N/A;   ESOPHAGOGASTRODUODENOSCOPY (EGD) WITH PROPOFOL N/A 10/08/2020   Procedure: ESOPHAGOGASTRODUODENOSCOPY (EGD) WITH PROPOFOL;  Surgeon: Toledo, Boykin Nearing, MD;  Location: [facility];  Service: Gastroenterology;  Laterality: N/A;  DM DEAF, NEEDS SIGN INTERPRETER PER SON   REVERSE SHOULDER ARTHROPLASTY Right 12/21/2019   Procedure: REVERSE SHOULDER ARTHROPLASTY;  Surgeon: Lyndle Herrlich, MD;  Location: [facility];  Service: Orthopedics;  Laterality: Right;   THUMB ARTHROSCOPY     TONSILLECTOMY     TYMPANOPLASTY  muliple     FAMILY HISTORY   Family History  Problem Relation Age of Onset   Lung cancer Mother    CAD Father    Miscarriages / Stillbirths Sister    Hyperlipidemia Sister    Hypertension Sister    Diabetes Sister    COPD Sister    Asthma Sister    Arthritis Sister    Breast cancer Sister    Heart attack Sister    Hyperlipidemia Sister    Heart disease Sister    Heart attack Sister    Diabetes Sister    Cancer Sister    Asthma Sister    Heart disease Brother    Heart attack Brother    Depression Brother    Hypertension Son    Hyperlipidemia Son    Heart disease Son    Depression Son    Hypertension Daughter    Depression Daughter    Asthma Daughter     Birth defects Paternal Uncle    Hearing loss Brother      SOCIAL HISTORY   Social History   Tobacco Use   Smoking status: Every Day    Current packs/day: 0.50    Average packs/day: 0.5 packs/day for 55.1 years (27.5 ttl pk-yrs)    Types: Cigarettes    Start date: 03/01/1968   Smokeless tobacco: Never  Vaping Use   Vaping status: Former  Substance Use Topics   Alcohol use: No   Drug use: No     MEDICATIONS    Home Medication:    Current Medication:  Current Facility-Administered Medications:    acetaminophen (TYLENOL) tablet 650 mg, 650 mg, Oral, Q6H PRN, 650 mg at 03/25/23 1140 **OR** acetaminophen (TYLENOL) suppository 650 mg, 650 mg, Rectal, Q6H PRN, Mikey College T, MD   cefTRIAXone (ROCEPHIN) 2 g in sodium chloride 0.9 % 100 mL IVPB, 2 g, Intravenous, Q24H, Emeline General, MD, Stopped at 03/25/23 1904   citalopram (CELEXA) tablet 10 mg, 10 mg, Oral, Daily, Mikey College T, MD, 10 mg at 03/25/23 1257   clopidogrel (PLAVIX) tablet 75 mg, 75 mg, Oral, Daily, Mikey College T, MD, 75 mg at 03/25/23 1257   doxycycline (VIBRA-TABS) tablet 100 mg, 100 mg, Oral, Q12H, Enedina Finner, MD   enoxaparin (LOVENOX) injection 40 mg, 40 mg, Subcutaneous, Q24H, Chipper Herb, Ping T, MD, 40 mg at 03/25/23 2238   fluticasone furoate-vilanterol (BREO ELLIPTA) 100-25 MCG/ACT 1 puff, 1 puff, Inhalation, Daily, Mikey College T, MD   furosemide (LASIX) tablet 40 mg, 40 mg, Oral, Daily, Chipper Herb, Ping T, MD, 40 mg at 03/25/23 1258   gabapentin (NEURONTIN) capsule 100 mg, 100 mg, Oral, Q1500, Mikey College T, MD, 100 mg at 03/25/23 1715   insulin aspart (novoLOG) injection 0-15 Units, 0-15 Units, Subcutaneous, TID WC, Mikey College T, MD, 11 Units at 03/25/23 1715   iohexol (OMNIPAQUE) 300 MG/ML solution 75 mL, 75 mL, Intravenous, Once PRN, Mikey College T, MD   ipratropium-albuterol (DUONEB) 0.5-2.5 (3) MG/3ML nebulizer solution 3 mL, 3 mL, Nebulization, Q6H, Allena Katz, Sona, MD   lacosamide (VIMPAT) tablet 150 mg, 150 mg,  Oral, Q12H, Mikey College T, MD, 150 mg at 03/25/23 2238   losartan (COZAAR) tablet 100 mg, 100 mg, Oral, Daily, Chipper Herb, Ping T, MD, 100 mg at 03/25/23 1257   methylPREDNISolone sodium succinate (SOLU-MEDROL) 125 mg/2 mL injection 60 mg, 60 mg, Intravenous, Q24H, Enedina Finner, MD   metoprolol succinate (TOPROL-XL) 24 hr tablet 25 mg, 25 mg, Oral, Daily, Emeline General, MD, 25  mg at 03/25/23 1449   montelukast (SINGULAIR) tablet 10 mg, 10 mg, Oral, QHS, Mikey College T, MD, 10 mg at 03/25/23 2238   morphine (PF) 2 MG/ML injection 2 mg, 2 mg, Intravenous, Q4H PRN, Mansy, Jan A, MD, 2 mg at 03/25/23 2359   ondansetron (ZOFRAN) tablet 4 mg, 4 mg, Oral, Q6H PRN **OR** ondansetron (ZOFRAN) injection 4 mg, 4 mg, Intravenous, Q6H PRN, Emeline General, MD   pantoprazole (PROTONIX) EC tablet 40 mg, 40 mg, Oral, Daily, Chipper Herb, Ping T, MD, 40 mg at 03/25/23 1258   potassium chloride SA (KLOR-CON M) CR tablet 20 mEq, 20 mEq, Oral, Daily, Mikey College T, MD, 20 mEq at 03/25/23 1258   rosuvastatin (CRESTOR) tablet 10 mg, 10 mg, Oral, QHS, Mikey College T, MD, 10 mg at 03/25/23 2241   spironolactone (ALDACTONE) tablet 25 mg, 25 mg, Oral, q morning, Mikey College T, MD, 25 mg at 03/25/23 1258    ALLERGIES   Celebrex [celecoxib], Ciprofloxacin, Codeine, Fosphenytoin, Levaquin [levofloxacin in d5w], Levofloxacin, Lovastatin, Pravastatin, Sulfa antibiotics, Aspirin, Azithromycin, and Penicillins     REVIEW OF SYSTEMS    Review of Systems:  Gen:  Denies  fever, sweats, chills weigh loss  HEENT: Denies blurred vision, double vision, ear pain, eye pain, hearing loss, nose bleeds, sore throat Cardiac:  No dizziness, chest pain or heaviness, chest tightness,edema Resp:   reports dyspnea chronically  Gi: Denies swallowing difficulty, stomach pain, nausea or vomiting, diarrhea, constipation, bowel incontinence Gu:  Denies bladder incontinence, burning urine Ext:   Denies Joint pain, stiffness or swelling Skin: Denies  skin  rash, easy bruising or bleeding or hives Endoc:  Denies polyuria, polydipsia , polyphagia or weight change Psych:   Denies depression, insomnia or hallucinations   Other:  All other systems negative   VS: BP 108/73 (BP Location: Left Arm)   Pulse 65   Temp 98.2 F (36.8 C) (Oral)   Resp (!) 22   Wt 72.8 kg   SpO2 96%   BMI 28.43 kg/m      PHYSICAL EXAM    GENERAL:NAD, no fevers, chills, no weakness no fatigue HEAD: Normocephalic, atraumatic.  EYES: Pupils equal, round, reactive to light. Extraocular muscles intact. No scleral icterus.  MOUTH: Moist mucosal membrane. Dentition intact. No abscess noted.  EAR, NOSE, THROAT: Clear without exudates. No external lesions.  NECK: Supple. No thyromegaly. No nodules. No JVD.  PULMONARY: decreased breath sounds with mild rhonchi worse at bases bilaterally.  CARDIOVASCULAR: S1 and S2. Regular rate and rhythm. No murmurs, rubs, or gallops. No edema. Pedal pulses 2+ bilaterally.  GASTROINTESTINAL: Soft, nontender, nondistended. No masses. Positive bowel sounds. No hepatosplenomegaly.  MUSCULOSKELETAL: No swelling, clubbing, or edema. Range of motion full in all extremities.  NEUROLOGIC: Cranial nerves II through XII are intact. No gross focal neurological deficits. Sensation intact. Reflexes intact.  SKIN: No ulceration, lesions, rashes, or cyanosis. Skin warm and dry. Turgor intact.  PSYCHIATRIC: Mood, affect within normal limits. The patient is awake, alert and oriented x 3. Insight, judgment intact.       IMAGING   Reviewed CT chest with patient  Narrative & Impression  CLINICAL DATA:  Respiratory distress   EXAM: CT CHEST WITHOUT CONTRAST   TECHNIQUE: Multidetector CT imaging of the chest was performed following the standard protocol without IV contrast.   RADIATION DOSE REDUCTION: This exam was performed according to the departmental dose-optimization program which includes automated exposure control, adjustment of the mA  and/or kV according to patient  size and/or use of iterative reconstruction technique.   COMPARISON:  08/15/2017   FINDINGS: Cardiovascular: Heart size normal. Small pericardial effusion, stable. 3-vessel coronary calcifications. Calcified plaque in the aortic arch and descending thoracic segment without aneurysm.   Mediastinum/Nodes: No mass or adenopathy. Leftward mediastinal shift secondary to volume loss from the left lung.   Lungs/Pleura: Masslike perihilar consolidation in the left lower lung with volume loss and air bronchograms. Scattered surrounding ground-glass and airspace opacities. Dependent consolidation/atelectasis in the left lower lobe. There is dense consolidation in the lingula with some linear subsegmental atelectasis in the posterior left upper lobe. Breathing motion degrades evaluation through the hila.   Hyperinflation of right lung across the midline, without focal lesion.   Upper Abdomen: Cholecystectomy clips.  No acute findings.   Musculoskeletal: Post right shoulder arthroplasty. No acute findings.   IMPRESSION: 1. Masslike perihilar consolidation in the left lower lung with surrounding ground-glass and airspace opacities, possibly infectious/inflammatory. Consider pulmonary consultation. 2. Stable small pericardial effusion. 3. Coronary and  Aortic Atherosclerosis (ICD10-I70.0).     Electronically Signed   By: Corlis Leak M.D.   On: 03/25/2023 13:08     ASSESSMENT/PLAN   Masslike perihilar left lower lobe consolidation -currently being treated with CAP therapy - on rocephin and doxycycline currently  - concern for possible LLL mass, will need PET scan post full scope of therapy for pneumonia - may need bronchosocpy for tissue samping - PT/PT -patient is ambulatory without assistance at home Currently on 5L/min [place]        Thank you for allowing me to participate in the care of this patient.   Patient/Family are satisfied with care  plan and all questions have been answered.    Provider disclosure: Patient with at least one acute or chronic illness or injury that poses a threat to life or bodily function and is being managed actively during this encounter.  All of the below services have been performed independently by signing provider:  review of prior documentation from internal and or external health records.  Review of previous and current lab results.  Interview and comprehensive assessment during patient visit today. Review of current and previous chest radiographs/CT scans. Discussion of management and test interpretation with health care team and patient/family.   This document was prepared using Dragon voice recognition software and may include unintentional dictation errors.     Vida Rigger, M.D.  Division of Pulmonary & Critical Care Medicine

## 2023-03-26 NOTE — Plan of Care (Signed)
  Problem: Respiratory: Goal: Ability to maintain adequate ventilation will improve Outcome: Progressing   Problem: Pain Managment: Goal: General experience of comfort will improve Outcome: Progressing   Problem: Safety: Goal: Ability to remain free from injury will improve Outcome: Progressing   Problem: Coping: Goal: Level of anxiety will decrease Outcome: Progressing   Problem: Clinical Measurements: Goal: Respiratory complications will improve Outcome: Progressing   Problem: Clinical Measurements: Goal: Cardiovascular complication will be avoided Outcome: Progressing

## 2023-03-26 NOTE — Evaluation (Addendum)
Clinical/Bedside Swallow Evaluation Patient Details  Name: [name] MRN: [id number] Date of Birth: October 23, 1950  Today's Date: 03/26/2023 Time: SLP Start Time (ACUTE ONLY): 1040 SLP Stop Time (ACUTE ONLY): 1130 SLP Time Calculation (min) (ACUTE ONLY): 50 min  Past Medical History:  Past Medical History:  Diagnosis Date   Asthma    CHF (congestive heart failure) (HCC)    Cirrhosis, non-alcoholic (HCC)    COPD (chronic obstructive pulmonary disease) (HCC)    Deaf    Depression    Diabetes mellitus without complication (HCC)    GERD (gastroesophageal reflux disease)    Heart murmur    Hepatitis    History of rheumatic fever    History of scarlet fever    Hypertension    IBS (irritable bowel syndrome)    Lymph node disorder    arm   Neuropathy    On home oxygen therapy    hs   Orthopnea    Osteoarthritis    RA (rheumatoid arthritis) (HCC)    RLS (restless legs syndrome)    Seizures (HCC)    Shortness of breath dyspnea    Sleep apnea    Stroke (HCC)    tia   Past Surgical History:  Past Surgical History:  Procedure Laterality Date   ABDOMINAL HYSTERECTOMY     BREAST BIOPSY Right 10/30/2020   Stereo Bx, X-clip,  BENIGN BREAST TISSUE WITH FIBROADENOMATOUS   CATARACT EXTRACTION W/PHACO Right 11/23/2014   Procedure: CATARACT EXTRACTION PHACO AND INTRAOCULAR LENS PLACEMENT (IOC);  Surgeon: Lia Hopping, MD;  Location: [facility] ORS;  Service: Ophthalmology;  Laterality: Right;   CATARACT EXTRACTION W/PHACO Left 12/14/2014   Procedure: CATARACT EXTRACTION PHACO AND INTRAOCULAR LENS PLACEMENT (IOC);  Surgeon: Lia Hopping, MD;  Location: [facility];  Service: Ophthalmology;  Laterality: Left;  US:01:16.6 AP:15.8 CDE:12.14   CESAREAN SECTION     CHOLECYSTECTOMY     COLONOSCOPY WITH PROPOFOL N/A 10/08/2020   Procedure: COLONOSCOPY WITH PROPOFOL;  Surgeon: Toledo, Boykin Nearing, MD;  Location: [facility];  Service: Gastroenterology;  Laterality: N/A;    ESOPHAGOGASTRODUODENOSCOPY (EGD) WITH PROPOFOL N/A 10/08/2020   Procedure: ESOPHAGOGASTRODUODENOSCOPY (EGD) WITH PROPOFOL;  Surgeon: Toledo, Boykin Nearing, MD;  Location: [facility];  Service: Gastroenterology;  Laterality: N/A;  DM DEAF, NEEDS SIGN INTERPRETER PER SON   REVERSE SHOULDER ARTHROPLASTY Right 12/21/2019   Procedure: REVERSE SHOULDER ARTHROPLASTY;  Surgeon: Lyndle Herrlich, MD;  Location: [facility];  Service: Orthopedics;  Laterality: Right;   THUMB ARTHROSCOPY     TONSILLECTOMY     TYMPANOPLASTY     muliple   HPI:  Pt is a 72 y.o. female with medical history significant of CHF, COPD Gold stage III, chronic HFpEF, mild/moderate AS, chronic hypoxic respiratory failure on 2 L at bedtime, OSA not tolerating CPAP at bedtime, deaf - lip reads, writing per her request, IIDM, presented with worsening of cough wheezing shortness of breath post ~1 month. Pt has a Baseline of Esphageal phase Dysmotility per MBSS in 2022.  MBSS in 2023 revealed No oropharyngeal phase dysphagia then. Difficulty breathing was not reported at her Neurology visit 02/2023; pt is on furosemide(lasix).   Chest CT: Masslike perihilar consolidation in the left lower lung with  surrounding ground-glass and airspace opacities, possibly  infectious/inflammatory. Consider pulmonary consultation.  2. Stable small pericardial effusion.     CXR 02/2023: Left lingular patchy opacities, likely atelectasis.  2. Mildly enlarged cardiomediastinal silhouette.   Per chart note 03/05/2023, "Her son, who is in charge of her medications,  does note that she has been without her Lasix and Vimpat for at least one month.".    Assessment / Plan / Recommendation  Clinical Impression   Pt seen for BSE today. Pt awake, verbal and communicates via lip reading, writing per her request. She has been deaf since ~age 42. She was feeding self and sat herself upright in bed. NSG presented pt w/ Pills during this session(education provided).  Pt on [place] O2  5L(trending down); afebrile. WBC WNL. Per previous swallow assessment/MBSS in 2023: "Pt demonstrates normal swallowing. No penetration, aspiration or residue observed. Pt able to sip from cup edge, drink continuously from cup or sip of drink fully from straw without change in function. Pt took a barium tablet that passed into stomach without hesitation. She was able to masticate despite missing dentition. Recommend pt continue a regular diet and thin liquids with basic aspiration precautions. No SLP f/u needed.".  Pt appears to present w/ functional oropharyngeal phase swallowing w/ No overt oropharyngeal phase dysphagia noted, No overt neuromuscular deficits noted. Pt consumed po's w/ No overt, clinical s/s of aspiration during the po trials.  Pt appears at reduced risk for aspiration when following general aspiration precautions including taking REST BREAKS and choosing foods easy to eat for conservation of energy. Following REFLUX precautions also recommended d/t documented Esophageal phase Dysmotility noted(2022) to reduce risk for REFLUX aspiration of material.  Pt has challenging factors that could impact her oropharyngeal swallowing to include Baseline Pulmonary decline on Home O2 and CPAP, deconditioning/weakness, and Esophageal phase dysmotility documented. These factors can increase risk for aspiration, dysphagia as well as decreased oral intake overall.   During po trials, pt consumed all consistencies w/ no overt coughing, decline in vocal quality, or gross change in respiratory presentation during/post trials. O2 sats remained in mid-upper 90s on monitor during po trials. REST BREAKS given to ease any increased WOB w/ the exertion of tasks. Pt was able to calm her breathing w/ the REST BREAKS. The monitor was utilized for visual feedback. Oral phase appeared grossly [facility] w/ timely bolus management, mastication, and control of bolus propulsion for A-P transfer for swallowing. Oral clearing achieved  w/ all trial consistencies -- moistened, soft foods given.  OM Exam appeared [facility] w/ no unilateral weakness noted. Pt fed self w/ setup support.   Recommend continue a fairly Regular consistency diet w/ well-Cut meats, moistened foods; Thin liquids -- carefully monitor straw use and should use a Cup IF increased coughing noted when drinking w/ straw. Pt agreed. Recommend general aspiration and REFLUX precautions, REST BREAKS during meals for conservation of energy.  Pills 1-2 at a time w/ small sips of water or WHOLE in Puree for ease of swallowing. Education given on Pills in Puree; food consistencies and easy to eat options; general aspiration and REFLUX precautions to pt. Handouts provided to pt who agreed. MD/NSG to reconsult if any new needs arise. NSG updated. Recommend Dietician f/u for support. SLP Visit Diagnosis: Dysphagia, unspecified (R13.10)    Aspiration Risk   (reduced following general aspiration precautions)    Diet Recommendation   Thin;Age appropriate regular (cut, moistened foods) = a fairly Regular consistency diet w/ well-Cut meats, moistened foods; Thin liquids -- carefully monitor straw use and should use a Cup IF increased coughing noted when drinking w/ straw. Pt agreed. Recommend general aspiration and REFLUX precautions, REST BREAKS during meals for conservation of energy.  Medication Administration: Whole meds with liquid (1-2 at a time; or in a Puree  for ease of swallowing)    Other  Recommendations Recommended Consults:  (Dietician f/u) Oral Care Recommendations: Oral care BID;Patient independent with oral care    Recommendations for follow up therapy are one component of a multi-disciplinary discharge planning process, led by the attending physician.  Recommendations may be updated based on patient status, additional functional criteria and insurance authorization.  Follow up Recommendations No SLP follow up      Assistance Recommended at Discharge   Intermittent-PRN  Functional Status Assessment Patient has had a recent decline in their functional status and demonstrates the ability to make significant improvements in function in a reasonable and predictable amount of time.  Frequency and Duration  (n/a)   (n/a)       Prognosis Prognosis for improved oropharyngeal function: Fair Barriers to Reach Goals: Cognitive deficits;Time post onset;Severity of deficits Barriers/Prognosis Comment: baseline Pulmonary decline      Swallow Study   General Date of Onset: 03/25/23 HPI: Pt is a 72 y.o. female with medical history significant of CHF, COPD Gold stage III, chronic HFpEF, mild/moderate AS, chronic hypoxic respiratory failure on 2 L at bedtime, OSA not tolerating CPAP at bedtime, deaf - lip reads, writing per her request, IIDM, presented with worsening of cough wheezing shortness of breath post ~1 month. Pt has a Baseline of Esphageal phase Dysmotility per MBSS in 2022.  MBSS in 2023 revealed No oropharyngeal phase dysphagia then. Difficulty breathing was not reported at her Neurology visit 02/2023; pt is on furosemide(lasix).  Chest CT: Masslike perihilar consolidation in the left lower lung with  surrounding ground-glass and airspace opacities, possibly  infectious/inflammatory. Consider pulmonary consultation.  2. Stable small pericardial effusion.    CXR 02/2023: Left lingular patchy opacities, likely atelectasis.  2. Mildly enlarged cardiomediastinal silhouette.  Per chart note 03/05/2023, "Her son, who is in charge of her medications, does note that she has been without her Lasix and Vimpat for at least one month.". Type of Study: Bedside Swallow Evaluation Previous Swallow Assessment: MBSSs 2022; 2023: "Pt demonstrates normal swallowing. No penetration, aspiration or residue observed. Pt able to sip from cup edge, drink continuously from cup or sip of drink fully from straw without change in function. Pt took a barium tablet that passed into  stomach without hesitation. She was able to masticate despite missing dentition. Recommend pt continue a regular diet and thin liquids with basic aspiration precautions. No SLP f/u needed. Diet Prior to this Study: Regular;Thin liquids (Level 0) Temperature Spikes Noted: No (wbc 9.5) Respiratory Status: Nasal cannula (5L) History of Recent Intubation: No Behavior/Cognition: Alert;Cooperative;Pleasant mood (Deaf; lip reads to communicate) Oral Cavity Assessment: Within Functional Limits Oral Care Completed by SLP: Recent completion by staff Oral Cavity - Dentition: Missing dentition;Adequate natural dentition Vision: Functional for self-feeding Self-Feeding Abilities: Able to feed self;Needs set up Patient Positioning: Upright in bed (helped to position self) Baseline Vocal Quality:  (Fair) Volitional Swallow: Able to elicit    Oral/Motor/Sensory Function Overall Oral Motor/Sensory Function: Within functional limits   Ice Chips Ice chips: Not tested   Thin Liquid Thin Liquid: Within functional limits Presentation: Self Fed;Straw;Cup (12+ trials) Other Comments: sips of liquids w/ pill swallowing too    Nectar Thick Nectar Thick Liquid: Not tested   Honey Thick Honey Thick Liquid: Not tested   Puree Puree: Within functional limits Presentation: Self Fed;Spoon (8 trials)   Solid     Solid: Within functional limits Presentation: Self Fed (3 trials)  Jerilynn Som, MS, CCC-SLP Speech Language Pathologist Rehab Services; [facility] [phone] (ascom) Karmah Potocki 03/26/2023,5:00 PM

## 2023-03-26 NOTE — Assessment & Plan Note (Signed)
Chronic. Stable on Breo daily and Duonebs twice daily. Continue. She is not on supplemental oxygen today. Denies shortness of breath today. She does report coughing. She has a follow up appointment with Pulmonology next week. Encouraged to follow up as scheduled.

## 2023-03-26 NOTE — Assessment & Plan Note (Signed)
Chronic. Stable on Crestor daily. Continue.

## 2023-03-26 NOTE — Assessment & Plan Note (Addendum)
Chronic. Taking Glimepiride 2 mg daily. Last A1c- 7.6. Will check A1c today. Encouraged healthy diet. If A1c more elevated will add medication. Will continue to monitor.

## 2023-03-26 NOTE — Procedures (Signed)
Pt has IV team in to put IV in pt. Will do eeg tomorrow.

## 2023-03-26 NOTE — Care Management Important Message (Signed)
Important Message  Patient Details  Name: [name] MRN: [id number] Date of Birth: 1951-01-16   Medicare Important Message Given:  N/A - LOS <3 / Initial given by admissions     Johnell Comings 03/26/2023, 3:16 PM

## 2023-03-26 NOTE — Progress Notes (Signed)
Pt has cough. She becomes extreamly sob .Marland Kitchen Can't get sputum expectorated. Notified md.

## 2023-03-26 NOTE — Procedures (Signed)
I Have 3 ICU patients ahead of this patient today. FYI

## 2023-03-26 NOTE — TOC Initial Note (Signed)
Transition of Care [facility]) - Initial/Assessment Note    Patient Details  Name: [name] MRN: [id number] Date of Birth: 09/13/1950  Transition of Care [facility]) CM/SW Contact:    Truddie Hidden, RN Phone Number: 03/26/2023, 4:10 PM  Clinical Narrative:    Spoke with patient's daughter.               Admitted PPI:[id number]  Admitted from: Home with son [facility]acy: [facility] Current home health/prior home health/DME:walker, rollator, shower seat, wheelchair, cane, home oxygen  Transporation: Son HH: not currently active. If recommended dtr would prefer [facility].            Patient Goals and CMS Choice            Expected Discharge Plan and Services                                              Prior Living Arrangements/Services                       Activities of Daily Living Home Assistive Devices/Equipment: Eyeglasses, Dentures (specify type) ADL Screening (condition at time of admission) Patient's cognitive ability adequate to safely complete daily activities?: No Is the patient deaf or have difficulty hearing?: Yes Does the patient have difficulty seeing, even when wearing glasses/contacts?: No Does the patient have difficulty concentrating, remembering, or making decisions?: No Patient able to express need for assistance with ADLs?: Yes Does the patient have difficulty dressing or bathing?: No Independently performs ADLs?: Yes (appropriate for developmental age) Does the patient have difficulty walking or climbing stairs?: No Weakness of Legs: None Weakness of Arms/Hands: None  Permission Sought/Granted                  Emotional Assessment              Admission diagnosis:  Pneumonia [J18.9] COPD exacerbation (HCC) [J44.1] Acute on chronic respiratory failure with hypoxia (HCC) [J96.21] Patient Active Problem List   Diagnosis Date Noted   Pneumonia 03/25/2023   Community acquired pneumonia of left lower lobe of lung  01/19/2023   Hyperlipidemia associated with type 2 diabetes mellitus (HCC) 11/19/2022   Acute systolic heart failure (HCC) 04/03/2022   Convulsions, status epilepticus (HCC) 04/02/2022   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 with seizures of localized onset, not intractable, with status epilepticus (HCC)    Respiratory failure, acute-on-chronic (HCC) 03/30/2022   Shortness of breath 09/30/2021   Elevated troponin 09/30/2021   Stress reaction causing mixed disturbance of emotion and conduct 09/30/2021   CHF (congestive heart failure) (HCC) 05/01/2021   History of CVA in adulthood 08/05/2019   COPD (chronic obstructive pulmonary disease) (HCC)    Deaf    On home oxygen therapy    Cirrhosis, non-alcoholic (HCC)    Asthma    Fall    Hypomagnesemia    Chronic diastolic heart failure (HCC) 08/26/2017   Tobacco use 08/26/2017   Seizure disorder (HCC) 06/15/2017   Hypoxia    Hypokalemia 02/12/2017   GERD (gastroesophageal reflux disease) 03/23/2016   Depression 03/23/2016   Diabetes mellitus without complication (HCC) 03/22/2016   Essential hypertension 03/22/2016   Neuropathy 03/22/2016   COPD exacerbation (HCC) 03/31/2015   PCP:  [name], NP Pharmacy:   [address] - [place], [place] - [address] [place] [address] Phone: [phone] Fax: [phone]  [facility] - [place], [place] - [address] [place] [address] Phone: [phone] Fax: [phone]  [facility] [address] [place] [address] Phone: [phone] Fax: [phone]     Social Determinants of Health (SDOH) Social History: SDOH Screenings   Food Insecurity: No Food Insecurity (03/25/2023)  Housing: Low Risk  (03/25/2023)  Transportation Needs: No Transportation Needs (03/25/2023)  Utilities: Not At Risk (03/25/2023)  Depression (PHQ2-9): Low Risk   (01/19/2023)  Recent Concern: Depression (PHQ2-9) - High Risk (11/19/2022)  Financial Resource Strain: Low Risk  (03/24/2023)   Received from [facility]  Tobacco Use: High Risk (03/25/2023)   SDOH Interventions:     Readmission Risk Interventions     No data to display

## 2023-03-26 NOTE — Progress Notes (Signed)
Placed purewick due to medical condition. Patient is extreamly sob with minimal exertion. She used the bsc thru the day and has become very fatigued.

## 2023-03-27 ENCOUNTER — Encounter: Payer: Self-pay | Admitting: Nurse Practitioner

## 2023-03-27 DIAGNOSIS — J441 Chronic obstructive pulmonary disease with (acute) exacerbation: Secondary | ICD-10-CM | POA: Diagnosis not present

## 2023-03-27 DIAGNOSIS — J9621 Acute and chronic respiratory failure with hypoxia: Secondary | ICD-10-CM | POA: Diagnosis not present

## 2023-03-27 DIAGNOSIS — J189 Pneumonia, unspecified organism: Secondary | ICD-10-CM | POA: Diagnosis not present

## 2023-03-27 DIAGNOSIS — G40909 Epilepsy, unspecified, not intractable, without status epilepticus: Secondary | ICD-10-CM | POA: Diagnosis not present

## 2023-03-27 LAB — LEGIONELLA PNEUMOPHILA SEROGP 1 UR AG: L. pneumophila Serogp 1 Ur Ag: NEGATIVE

## 2023-03-27 LAB — GLUCOSE, CAPILLARY
Glucose-Capillary: 135 mg/dL — ABNORMAL HIGH (ref 70–99)
Glucose-Capillary: 104 mg/dL — ABNORMAL HIGH (ref 70–99)
Glucose-Capillary: 309 mg/dL — ABNORMAL HIGH (ref 70–99)
Glucose-Capillary: 292 mg/dL — ABNORMAL HIGH (ref 70–99)

## 2023-03-27 MED ORDER — TRAMADOL HCL 50 MG PO TABS
50.0000 mg | ORAL_TABLET | Freq: Once | ORAL | Status: AC
  Administered 2023-03-27: 50 mg via ORAL
  Filled 2023-03-27: qty 1

## 2023-03-27 MED ORDER — TIOTROPIUM BROMIDE MONOHYDRATE 18 MCG IN CAPS
1.0000 | ORAL_CAPSULE | Freq: Every day | RESPIRATORY_TRACT | Status: DC
  Administered 2023-03-27 – 2023-03-29 (×3): 18 ug via RESPIRATORY_TRACT
  Filled 2023-03-27: qty 5

## 2023-03-27 MED ORDER — POTASSIUM CHLORIDE ER 10 MEQ PO TBCR
10.0000 meq | EXTENDED_RELEASE_TABLET | Freq: Two times a day (BID) | ORAL | Status: DC
  Administered 2023-03-27 – 2023-03-29 (×5): 10 meq via ORAL
  Filled 2023-03-27 (×10): qty 1

## 2023-03-27 MED ORDER — PREDNISONE 20 MG PO TABS
40.0000 mg | ORAL_TABLET | Freq: Every day | ORAL | Status: DC
  Administered 2023-03-27 – 2023-03-29 (×3): 40 mg via ORAL
  Filled 2023-03-27 (×3): qty 2

## 2023-03-27 MED ORDER — ROPINIROLE HCL 1 MG PO TABS
0.5000 mg | ORAL_TABLET | Freq: Every day | ORAL | Status: DC
  Administered 2023-03-27 – 2023-03-28 (×2): 0.5 mg via ORAL
  Filled 2023-03-27 (×2): qty 1

## 2023-03-27 MED ORDER — GUAIFENESIN ER 600 MG PO TB12
600.0000 mg | ORAL_TABLET | Freq: Two times a day (BID) | ORAL | Status: DC
  Administered 2023-03-27 – 2023-03-29 (×5): 600 mg via ORAL
  Filled 2023-03-27 (×6): qty 1

## 2023-03-27 MED ORDER — ISOSORBIDE MONONITRATE ER 30 MG PO TB24
30.0000 mg | ORAL_TABLET | Freq: Every day | ORAL | Status: DC
  Administered 2023-03-27 – 2023-03-29 (×3): 30 mg via ORAL
  Filled 2023-03-27 (×3): qty 1

## 2023-03-27 MED ORDER — GLIMEPIRIDE 2 MG PO TABS
2.0000 mg | ORAL_TABLET | Freq: Every day | ORAL | Status: DC
  Administered 2023-03-27 – 2023-03-29 (×3): 2 mg via ORAL
  Filled 2023-03-27 (×3): qty 1

## 2023-03-27 MED ORDER — FLUTICASONE PROPIONATE 50 MCG/ACT NA SUSP
1.0000 | Freq: Two times a day (BID) | NASAL | Status: DC
  Administered 2023-03-27 – 2023-03-29 (×5): 1 via NASAL
  Filled 2023-03-27: qty 16

## 2023-03-27 NOTE — Progress Notes (Signed)
[facility]  - [place] [facility]   PATIENT NAME: [name]    MR#:  [id number]  DATE OF BIRTH:  1950-10-23  SUBJECTIVE:  patient seen in the morning. No family at that time in the room. Patient was sitting up in the recliner and was having a coughing spell was bringing up pick sputum. We was causing her quite a bit of distress. Started on some cough meds. Maintaining sats greater than 92% on file later nasal cannula oxygen.   VITALS:  Blood pressure (!) 158/71, pulse 69, temperature 97.8 F (36.6 C), resp. rate 19, weight 72.8 kg, SpO2 97%.  PHYSICAL EXAMINATION:   GENERAL:  72 y.o.-year-old patient with no acute distress.Deaf  LUNGS:decreased breath sounds bilaterally, no wheezing CARDIOVASCULAR: S1, S2 normal. No murmur   ABDOMEN: Soft, nontender, nondistended. EXTREMITIES: + edema b/l.    NEUROLOGIC: nonfocal  patient is alert and awake  LABORATORY PANEL:  CBC Recent Labs  Lab 03/25/23 0757  WBC 9.5  HGB 14.8  HCT 44.7  PLT 250    Chemistries  Recent Labs  Lab 03/25/23 0757  NA 140  K 3.6  CL 105  CO2 23  GLUCOSE 179*  BUN 15  CREATININE 0.65  CALCIUM 8.7*  AST 19  ALT 11  ALKPHOS 104  BILITOT 0.9    Assessment and Plan   [name] is a 72 y.o. female with medical history significant of COPD Gold stage III, chronic HFpEF, mild/moderate AS, chronic hypoxic respiratory failure on 2 L at bedtime, OSA not tolerating CPAP at bedtime, deaf, IIDM, presented with worsening of cough wheezing shortness of breath.  EMS arrived and found patient O2 saturation lower 80s and patient was placed on CPAP.   Acute on chronic hypoxic respiratory failure Lobar mass like consolidation  -Likely secondary to combination effect of COPD exacerbation and mass like left lobar pneumonia vs malignancy  --this it pt's 3rd round of abxs -Treat COPD exacerbation IV Solu-Medrol, breathing treatment guaifenesin incentive spirometry -Wean oxygen as  tolerated --seen by [facility] dr A--PET scan vs Bronch --procalcitonin negative, wbc normal -added Mucinex   Acute COPD exacerbation -As above -- patient desaturated into the upper 80s with occupational therapy with ambulation improved to 93% with 5 L nasal cannula oxygen. Patient will require oxygen during daytime as well.   Question of breakthrough seizure versus nonepileptic seizure -No direct evidence of seizure activity en route to hospital.  -- Case was discussed with on-call neurology Dr. Wilford Corner, who is familiar with the patient as outpatient and he recommended no change of current antiseizure regimen.  Her Keppra was recently discontinued due to worsening of lethargy. -- Patient refused to do EEG. -- No seizure reported   Chronic HFpEF Mild-moderate AS -Euvolemic, continue p.o. Lasix -Continue metoprolol, losartan and spironolactone   DM-II -SSI for now.  Procedures: Family communication :none today Consults :[facility] pulm CODE STATUS: FULL DVT Prophylaxis :lovenox Level of care: Progressive Status is: Inpatient Remains inpatient appropriate because: respiratory failure, PNA    TOTAL TIME TAKING CARE OF THIS PATIENT: 35 minutes.  >50% time spent on counselling and coordination of care  Note: This dictation was prepared with Dragon dictation along with smaller phrase technology. Any transcriptional errors that result from this process are unintentional.  Enedina Finner M.D    [facility]   CC: Primary care physician; [name], NP

## 2023-03-27 NOTE — Assessment & Plan Note (Signed)
Chronic. Stable today in office. Home BP log reviewed- stable. She will continue her current mediation regimen. Lab work as outlined. Encouraged to continue checking her blood pressure at home. Will continue to monitor.

## 2023-03-27 NOTE — Evaluation (Signed)
Occupational Therapy Evaluation Patient Details Name: [name] MRN: [id number] DOB: [birth year] Today's Date: 03/27/2023   History of Present Illness Pt is a 72 y.o. female with medical history significant of COPD Gold stage III, chronic HFpEF, mild/moderate AS, chronic hypoxic respiratory failure on 2 L at bedtime, OSA not tolerating CPAP at bedtime, deaf, IIDM, presented with worsening of cough wheezing shortness of breath.   Clinical Impression   Ms. Orand was seen for OT evaluation this date. Prior to hospital admission, pt was independent with ADL management and denies falls history in last six months. Pt lives with her adult son in a 1 level apartment home.  Pt presents to acute OT demonstrating impaired ADL performance and functional mobility 2/2 generalized weakness and limited cardiopulmonary status (See OT problem list for additional functional deficits). Pt currently requires SUPERVISION for safety with LB ADL management and functional mobility. See ADL section below for additional detail.  Pt would benefit from skilled OT services to address noted impairments and functional limitations (see below for any additional details) in order to maximize safety and independence while minimizing falls risk and caregiver burden. Anticipate the need for follow up OT services in the home setting upon acute hospital DC.    Of note: per son/pt pt generally uses 2L home O2 at night but will also use during the day on occasion. MD requested walking O2 saturation with therapy this date. At rest pt spO2 while on 5L [place] was noted to be 94%. With ambulation she is noted to desat to 88% while on 5L [place]. Pt benefeted from therapeutic rest breaks and cues for PLB, t/o session. With seated rest break she is able to rebound to >/= 93% in <1 min.      If plan is discharge home, recommend the following: A little help with walking and/or transfers;A little help with bathing/dressing/bathroom;Help with stairs or  ramp for entrance;Assist for transportation    Functional Status Assessment  Patient has had a recent decline in their functional status and demonstrates the ability to make significant improvements in function in a reasonable and predictable amount of time.  Equipment Recommendations  None recommended by OT    Recommendations for Other Services       Precautions / Restrictions Precautions Precautions: Fall Restrictions Weight Bearing Restrictions: No      Mobility Bed Mobility Overal bed mobility: Modified Independent             General bed mobility comments: Increased time to perform with HOB elevated.    Transfers Overall transfer level: Needs assistance Equipment used: 1 person hand held assist Transfers: Sit to/from Stand Sit to Stand: Contact guard assist                  Balance Overall balance assessment: No apparent balance deficits (not formally assessed)                                         ADL either performed or assessed with clinical judgement   ADL Overall ADL's : Needs assistance/impaired                                       General ADL Comments: Pt functionally limited by cardiopulmonary status and decreased activity tolerance. She requires SUPERVISION for LB dressing  from STS, toilet transfer and toileting. MIN GUARD for functional mobility in room/hall. Pt notably SOB, benefits from frequent therapeutic rest breaks t/o session. Educated on pursed lipped breathing technique for energy conservation.     Vision Patient Visual Report: No change from baseline       Perception         Praxis         Pertinent Vitals/Pain Pain Assessment Pain Assessment: No/denies pain     Extremity/Trunk Assessment Upper Extremity Assessment Upper Extremity Assessment: Generalized weakness   Lower Extremity Assessment Lower Extremity Assessment: Generalized weakness       Communication  Communication Communication: Other (comment) (Deaf, ASL) Cueing Techniques: Gestural cues;Visual cues   Cognition Arousal: Alert Behavior During Therapy: WFL for tasks assessed/performed Overall Cognitive Status: Within Functional Limits for tasks assessed                                 General Comments: AO x4; Pt can mouth words faintly and reads lips. Initially began session with video interpreter services, however, once son arrives pt states she does not need interpreter and son assists with interpretation t/o remainder of session.     General Comments  Able to sustain static and dynamic seated edge of recliner balance during functional activities    Exercises Other Exercises Other Exercises: Pt educated on falls prevention strategies for home and hospital and DC recs. OT facilitated ADL management with education and assist as described above.   Shoulder Instructions      Home Living Family/patient expects to be discharged to:: Private residence Living Arrangements: Children Available Help at Discharge: Family;Available PRN/intermittently Type of Home: Apartment Home Access: Level entry     Home Layout: One level     Bathroom Shower/Tub: Chief Strategy Officer: Standard     Home Equipment: Agricultural consultant (2 wheels);Rollator (4 wheels);Shower seat - built in;Wheelchair - manual;Cane - single point;Other (comment)   Additional Comments: Pt lives with son who works 12-hour shifts during the day ~3x/week      Prior Functioning/Environment Prior Level of Function : Independent/Modified Independent             Mobility Comments: independent in home with use of RW and 2L O2 primarily at night but son reports she will use O2 during the day on occasion. ADLs Comments: Independent with ADL/IADL management, driving, walks to mail box each day. Denies falls history.        OT Problem List: Decreased strength;Decreased activity  tolerance;Decreased safety awareness;Cardiopulmonary status limiting activity      OT Treatment/Interventions: Self-care/ADL training;Therapeutic exercise;DME and/or AE instruction;Patient/family education;Energy conservation;Therapeutic activities    OT Goals(Current goals can be found in the care plan section) Acute Rehab OT Goals Patient Stated Goal: To go home OT Goal Formulation: With patient Time For Goal Achievement: 04/10/23 ADL Goals Pt Will Perform Grooming: sitting;with modified independence Pt Will Perform Lower Body Dressing: sit to/from stand;with modified independence;with adaptive equipment Pt Will Transfer to Toilet: bedside commode;ambulating;with modified independence Pt Will Perform Toileting - Clothing Manipulation and hygiene: sit to/from stand;with modified independence  OT Frequency: Min 1X/week    Co-evaluation              AM-PAC OT "6 Clicks" Daily Activity     Outcome Measure Help from another person eating meals?: None Help from another person taking care of personal grooming?: None Help from another person toileting,  which includes using toliet, bedpan, or urinal?: A Little Help from another person bathing (including washing, rinsing, drying)?: A Little Help from another person to put on and taking off regular upper body clothing?: None Help from another person to put on and taking off regular lower body clothing?: A Little 6 Click Score: 21   End of Session Equipment Utilized During Treatment: Gait belt;Oxygen Nurse Communication: Mobility status  Activity Tolerance: Patient tolerated treatment well Patient left: in chair;with call bell/phone within reach;with chair alarm set;with family/visitor present  OT Visit Diagnosis: Other abnormalities of gait and mobility (R26.89);Muscle weakness (generalized) (M62.81)                Time: 1610-9604 OT Time Calculation (min): 51 min Charges:  OT General Charges $OT Visit: 1 Visit OT Evaluation $OT  Eval Moderate Complexity: 1 Mod OT Treatments $Self Care/Home Management : 23-37 mins  Rockney Ghee, M.S., OTR/L 03/27/23, 12:00 PM

## 2023-03-27 NOTE — TOC Progression Note (Signed)
Transition of Care [facility]) - Progression Note    Patient Details  Name: SHIZUYE MESSAMORE MRN: [id number] Date of Birth: 09/12/1950  Transition of Care [facility]) CM/SW Contact  Truddie Hidden, RN Phone Number: 03/27/2023, 2:58 PM  Clinical Narrative:    Spoke with patient's son, [name] regarding oxygen. Patient's son states patient's oxygen is provided by [employer]. He was advised patient may need continuous oxygen.    Expected Discharge Plan: Home/Self Care Barriers to Discharge: Continued Medical Work up  Expected Discharge Plan and Services       Living arrangements for the past 2 months: Single Family Home                                       Social Determinants of Health (SDOH) Interventions SDOH Screenings   Food Insecurity: No Food Insecurity (03/25/2023)  Housing: Low Risk  (03/25/2023)  Transportation Needs: No Transportation Needs (03/25/2023)  Utilities: Not At Risk (03/25/2023)  Depression (PHQ2-9): Low Risk  (01/19/2023)  Recent Concern: Depression (PHQ2-9) - High Risk (11/19/2022)  Financial Resource Strain: Low Risk  (03/24/2023)   Received from [facility]  Tobacco Use: High Risk (03/27/2023)    Readmission Risk Interventions    03/26/2023    4:14 PM  Readmission Risk Prevention Plan  Transportation Screening Complete  PCP or Specialist Appt within 3-5 Days Complete  HRI or Home Care Consult Complete  Social Work Consult for Recovery Care Planning/Counseling Complete  Palliative Care Screening Not Applicable  Medication Review Oceanographer) Complete

## 2023-03-27 NOTE — Progress Notes (Signed)
PULMONOLOGY         Date: 03/27/2023,   MRN# [id number] [name] May 27, 1951     AdmissionWeight: 72.8 kg                 CurrentWeight: 72.8 kg  Referring provider: Dr Enedina Finner   CHIEF COMPLAINT:   Cavitary lung disease on CT chest    HISTORY OF PRESENT ILLNESS   [name] is a 72 y.o. female with medical history significant of COPD Gold stage III, chronic HFpEF, mild/moderate AS, chronic hypoxic respiratory failure on 2 L at bedtime, OSA not tolerating CPAP at bedtime, deaf, IIDM, presented with worsening of cough wheezing shortness of breath.  She was seen by Dr Meredeth Ide with [facility] pulmonary and noted to have signs of Acute COPD exacerbation she was placed on antibiotics and prednisone for her respiratory symptoms. Despite this she continued to get worse and her pulmonologist recommended to seek ER evaluation.     Her son [name] is at bedside and helps due to deafness with patient.   She is on antibiotics now on CAP therapy and is on 5L/min [place].    03/27/23-patient sitting up in chair resting comfortably. She's on 5L/min and is on 2 antibiotics rocephin and doxycycline. She is saturating 98% and resp examination is improved with less rhonchi.   PAST MEDICAL HISTORY   Past Medical History:  Diagnosis Date   Asthma    CHF (congestive heart failure) (HCC)    Cirrhosis, non-alcoholic (HCC)    COPD (chronic obstructive pulmonary disease) (HCC)    Deaf    Depression    Diabetes mellitus without complication (HCC)    GERD (gastroesophageal reflux disease)    Heart murmur    Hepatitis    History of rheumatic fever    History of scarlet fever    Hypertension    IBS (irritable bowel syndrome)    Lymph node disorder    arm   Neuropathy    On home oxygen therapy    hs   Orthopnea    Osteoarthritis    RA (rheumatoid arthritis) (HCC)    RLS (restless legs syndrome)    Seizures (HCC)    Shortness of breath dyspnea    Sleep apnea    Stroke  (HCC)    tia     SURGICAL HISTORY   Past Surgical History:  Procedure Laterality Date   ABDOMINAL HYSTERECTOMY     BREAST BIOPSY Right 10/30/2020   Stereo Bx, X-clip,  BENIGN BREAST TISSUE WITH FIBROADENOMATOUS   CATARACT EXTRACTION W/PHACO Right 11/23/2014   Procedure: CATARACT EXTRACTION PHACO AND INTRAOCULAR LENS PLACEMENT (IOC);  Surgeon: Lia Hopping, MD;  Location: [facility] ORS;  Service: Ophthalmology;  Laterality: Right;   CATARACT EXTRACTION W/PHACO Left 12/14/2014   Procedure: CATARACT EXTRACTION PHACO AND INTRAOCULAR LENS PLACEMENT (IOC);  Surgeon: Lia Hopping, MD;  Location: [facility] ORS;  Service: Ophthalmology;  Laterality: Left;  US:01:16.6 AP:15.8 CDE:12.14   CESAREAN SECTION     CHOLECYSTECTOMY     COLONOSCOPY WITH PROPOFOL N/A 10/08/2020   Procedure: COLONOSCOPY WITH PROPOFOL;  Surgeon: Toledo, Boykin Nearing, MD;  Location: [facility] ENDOSCOPY;  Service: Gastroenterology;  Laterality: N/A;   ESOPHAGOGASTRODUODENOSCOPY (EGD) WITH PROPOFOL N/A 10/08/2020   Procedure: ESOPHAGOGASTRODUODENOSCOPY (EGD) WITH PROPOFOL;  Surgeon: Toledo, Boykin Nearing, MD;  Location: [facility] END[facility];  Service: Gastroenterology;  Laterality: N/A;  DM DEAF, NEEDS SIGN INTERPRETER PER SON   REVERSE SHOULDER ARTHROPLASTY Right 12/21/2019   Procedure: REVERSE SHOULDER  ARTHROPLASTY;  Surgeon: Lyndle Herrlich, MD;  Location: [facility] ORS;  Service: Orthopedics;  Laterality: Right;   THUMB ARTHROSCOPY     TONSILLECTOMY     TYMPANOPLASTY     muliple     FAMILY HISTORY   Family History  Problem Relation Age of Onset   Lung cancer Mother    CAD Father    Miscarriages / Stillbirths Sister    Hyperlipidemia Sister    Hypertension Sister    Diabetes Sister    COPD Sister    Asthma Sister    Arthritis Sister    Breast cancer Sister    Heart attack Sister    Hyperlipidemia Sister    Heart disease Sister    Heart attack Sister    Diabetes Sister    Cancer Sister    Asthma Sister    Heart disease Brother     Heart attack Brother    Depression Brother    Hypertension Son    Hyperlipidemia Son    Heart disease Son    Depression Son    Hypertension Daughter    Depression Daughter    Asthma Daughter    Birth defects Paternal Uncle    Hearing loss Brother      SOCIAL HISTORY   Social History   Tobacco Use   Smoking status: Every Day    Current packs/day: 0.50    Average packs/day: 0.5 packs/day for 55.1 years (27.5 ttl pk-yrs)    Types: Cigarettes    Start date: 03/01/1968   Smokeless tobacco: Never  Vaping Use   Vaping status: Former  Substance Use Topics   Alcohol use: No   Drug use: No     MEDICATIONS    Home Medication:    Current Medication:  Current Facility-Administered Medications:    acetaminophen (TYLENOL) tablet 650 mg, 650 mg, Oral, Q6H PRN, 650 mg at 03/26/23 2104 **OR** acetaminophen (TYLENOL) suppository 650 mg, 650 mg, Rectal, Q6H PRN, Mikey College T, MD   cefTRIAXone (ROCEPHIN) 2 g in sodium chloride 0.9 % 100 mL IVPB, 2 g, Intravenous, Q24H, Emeline General, MD, Stopped at 03/26/23 1627   citalopram (CELEXA) tablet 10 mg, 10 mg, Oral, Daily, Chipper Herb, Ping T, MD, 10 mg at 03/27/23 1023   clopidogrel (PLAVIX) tablet 75 mg, 75 mg, Oral, Daily, Mikey College T, MD, 75 mg at 03/27/23 1024   doxycycline (VIBRA-TABS) tablet 100 mg, 100 mg, Oral, Q12H, Enedina Finner, MD, 100 mg at 03/27/23 1024   enoxaparin (LOVENOX) injection 40 mg, 40 mg, Subcutaneous, Q24H, Chipper Herb, Ping T, MD, 40 mg at 03/26/23 2105   fluticasone (FLONASE) 50 MCG/ACT nasal spray 1 spray, 1 spray, Each Nare, BID, Allena Katz, Sona, MD   fluticasone furoate-vilanterol (BREO ELLIPTA) 100-25 MCG/ACT 1 puff, 1 puff, Inhalation, Daily, Mikey College T, MD, 1 puff at 03/27/23 0900   furosemide (LASIX) tablet 40 mg, 40 mg, Oral, Daily, Mikey College T, MD, 40 mg at 03/27/23 1024   gabapentin (NEURONTIN) capsule 100 mg, 100 mg, Oral, Q1500, Mikey College T, MD, 100 mg at 03/26/23 1623   glimepiride (AMARYL) tablet 2 mg, 2  mg, Oral, Q breakfast, Enedina Finner, MD, 2 mg at 03/27/23 1024   guaiFENesin (MUCINEX) 12 hr tablet 600 mg, 600 mg, Oral, BID, Allena Katz, Sona, MD   insulin aspart (novoLOG) injection 0-15 Units, 0-15 Units, Subcutaneous, TID WC, Mikey College T, MD, 2 Units at 03/27/23 0808   iohexol (OMNIPAQUE) 300 MG/ML solution 75 mL, 75 mL, Intravenous, Once PRN, Chipper Herb,  Renae Fickle, MD   ipratropium-albuterol (DUONEB) 0.5-2.5 (3) MG/3ML nebulizer solution 3 mL, 3 mL, Nebulization, Q6H, Enedina Finner, MD, 3 mL at 03/27/23 1114   isosorbide mononitrate (IMDUR) 24 hr tablet 30 mg, 30 mg, Oral, Daily, Enedina Finner, MD, 30 mg at 03/27/23 1030   lacosamide (VIMPAT) tablet 150 mg, 150 mg, Oral, Q12H, Mikey College T, MD, 150 mg at 03/27/23 1021   losartan (COZAAR) tablet 100 mg, 100 mg, Oral, Daily, Mikey College T, MD, 100 mg at 03/27/23 1021   metoprolol succinate (TOPROL-XL) 24 hr tablet 25 mg, 25 mg, Oral, Daily, Mikey College T, MD, 25 mg at 03/27/23 1023   montelukast (SINGULAIR) tablet 10 mg, 10 mg, Oral, QHS, Mikey College T, MD, 10 mg at 03/26/23 2105   ondansetron (ZOFRAN) tablet 4 mg, 4 mg, Oral, Q6H PRN **OR** ondansetron (ZOFRAN) injection 4 mg, 4 mg, Intravenous, Q6H PRN, Emeline General, MD   pantoprazole (PROTONIX) EC tablet 40 mg, 40 mg, Oral, Daily, Mikey College T, MD, 40 mg at 03/27/23 1031   potassium chloride (KLOR-CON) CR tablet 10 mEq, 10 mEq, Oral, BID, Enedina Finner, MD, 10 mEq at 03/27/23 1024   potassium chloride SA (KLOR-CON M) CR tablet 20 mEq, 20 mEq, Oral, Daily, Mikey College T, MD, 20 mEq at 03/27/23 1022   predniSONE (DELTASONE) tablet 40 mg, 40 mg, Oral, Q breakfast, Enedina Finner, MD, 40 mg at 03/27/23 1022   rOPINIRole (REQUIP) tablet 0.5 mg, 0.5 mg, Oral, QHS, Enedina Finner, MD   rosuvastatin (CRESTOR) tablet 10 mg, 10 mg, Oral, QHS, Mikey College T, MD, 10 mg at 03/26/23 2104   spironolactone (ALDACTONE) tablet 25 mg, 25 mg, Oral, q morning, Mikey College T, MD, 25 mg at 03/27/23 1024   tiotropium [facility])  inhalation capsule ([facility] use ONLY) 18 mcg, 1 capsule, Inhalation, Daily, Enedina Finner, MD    ALLERGIES   Celebrex [celecoxib], Ciprofloxacin, Codeine, Fosphenytoin, Levaquin [levofloxacin in d5w], Levofloxacin, Lovastatin, Pravastatin, Sulfa antibiotics, Aspirin, Azithromycin, and Penicillins     REVIEW OF SYSTEMS    Review of Systems:  Gen:  Denies  fever, sweats, chills weigh loss  HEENT: Denies blurred vision, double vision, ear pain, eye pain, hearing loss, nose bleeds, sore throat Cardiac:  No dizziness, chest pain or heaviness, chest tightness,edema Resp:   reports dyspnea chronically  Gi: Denies swallowing difficulty, stomach pain, nausea or vomiting, diarrhea, constipation, bowel incontinence Gu:  Denies bladder incontinence, burning urine Ext:   Denies Joint pain, stiffness or swelling Skin: Denies  skin rash, easy bruising or bleeding or hives Endoc:  Denies polyuria, polydipsia , polyphagia or weight change Psych:   Denies depression, insomnia or hallucinations   Other:  All other systems negative   VS: BP (!) 158/71 (BP Location: Left Arm)   Pulse 69   Temp 97.8 F (36.6 C)   Resp 19   Wt 72.8 kg   SpO2 97%   BMI 28.43 kg/m      PHYSICAL EXAM    GENERAL:NAD, no fevers, chills, no weakness no fatigue HEAD: Normocephalic, atraumatic.  EYES: Pupils equal, round, reactive to light. Extraocular muscles intact. No scleral icterus.  MOUTH: Moist mucosal membrane. Dentition intact. No abscess noted.  EAR, NOSE, THROAT: Clear without exudates. No external lesions.  NECK: Supple. No thyromegaly. No nodules. No JVD.  PULMONARY: decreased breath sounds with mild rhonchi worse at bases bilaterally.  CARDIOVASCULAR: S1 and S2. Regular rate and rhythm. No murmurs, rubs, or gallops. No edema. Pedal pulses 2+ bilaterally.  GASTROINTESTINAL: Soft, nontender, nondistended. No masses. Positive bowel sounds. No hepatosplenomegaly.  MUSCULOSKELETAL: No swelling, clubbing,  or edema. Range of motion full in all extremities.  NEUROLOGIC: Cranial nerves II through XII are intact. No gross focal neurological deficits. Sensation intact. Reflexes intact.  SKIN: No ulceration, lesions, rashes, or cyanosis. Skin warm and dry. Turgor intact.  PSYCHIATRIC: Mood, affect within normal limits. The patient is awake, alert and oriented x 3. Insight, judgment intact.       IMAGING   Reviewed CT chest with patient  Narrative & Impression  CLINICAL DATA:  Respiratory distress   EXAM: CT CHEST WITHOUT CONTRAST   TECHNIQUE: Multidetector CT imaging of the chest was performed following the standard protocol without IV contrast.   RADIATION DOSE REDUCTION: This exam was performed according to the departmental dose-optimization program which includes automated exposure control, adjustment of the mA and/or kV according to patient size and/or use of iterative reconstruction technique.   COMPARISON:  08/15/2017   FINDINGS: Cardiovascular: Heart size normal. Small pericardial effusion, stable. 3-vessel coronary calcifications. Calcified plaque in the aortic arch and descending thoracic segment without aneurysm.   Mediastinum/Nodes: No mass or adenopathy. Leftward mediastinal shift secondary to volume loss from the left lung.   Lungs/Pleura: Masslike perihilar consolidation in the left lower lung with volume loss and air bronchograms. Scattered surrounding ground-glass and airspace opacities. Dependent consolidation/atelectasis in the left lower lobe. There is dense consolidation in the lingula with some linear subsegmental atelectasis in the posterior left upper lobe. Breathing motion degrades evaluation through the hila.   Hyperinflation of right lung across the midline, without focal lesion.   Upper Abdomen: Cholecystectomy clips.  No acute findings.   Musculoskeletal: Post right shoulder arthroplasty. No acute findings.   IMPRESSION: 1. Masslike perihilar  consolidation in the left lower lung with surrounding ground-glass and airspace opacities, possibly infectious/inflammatory. Consider pulmonary consultation. 2. Stable small pericardial effusion. 3. Coronary and  Aortic Atherosclerosis (ICD10-I70.0).     Electronically Signed   By: Corlis Leak M.D.   On: 03/25/2023 13:08     ASSESSMENT/PLAN   Masslike perihilar left lower lobe consolidation -currently being treated with CAP therapy - on rocephin and doxycycline currently  - concern for possible LLL mass, will need PET scan post full scope of therapy for pneumonia - may need bronchosocpy for tissue samping - PT/PT -patient is ambulatory without assistance at home Currently on 5L/min [place]        Thank you for allowing me to participate in the care of this patient.   Patient/Family are satisfied with care plan and all questions have been answered.    Provider disclosure: Patient with at least one acute or chronic illness or injury that poses a threat to life or bodily function and is being managed actively during this encounter.  All of the below services have been performed independently by signing provider:  review of prior documentation from internal and or external health records.  Review of previous and current lab results.  Interview and comprehensive assessment during patient visit today. Review of current and previous chest radiographs/CT scans. Discussion of management and test interpretation with health care team and patient/family.   This document was prepared using Dragon voice recognition software and may include unintentional dictation errors.     Vida Rigger, M.D.  Division of Pulmonary & Critical Care Medicine

## 2023-03-27 NOTE — Evaluation (Signed)
Physical Therapy Evaluation Patient Details Name: [name] MRN: [id number] DOB: [birth year] Today's Date: 03/27/2023  History of Present Illness  Pt is a 72 y.o. female with medical history significant of COPD Gold stage III, chronic HFpEF, mild/moderate AS, chronic hypoxic respiratory failure on 2 L at bedtime, OSA not tolerating CPAP at bedtime, deaf, IIDM, presented with worsening of cough wheezing shortness of breath.  Clinical Impression   Pt is received in recliner with son at bedside, she is agreeable to PT session. Son, [name], assisted with translation throughout session secondary to language barrier and refusal of hospital interpreter. At baseline, Pt reports using RW for short amb distance. Pt performs transfers and amb CGA. Pt able to amb approx 75 ft using IV pole to assist with stability on 10L [place] with SpO2 92-94% HR 57-74 bpm throughout amb activity. Pt required 2 prolonged standing rest breaks due to increased WOB requiring additional 1 HHA assist for last 10 ft. Overall, Pt demonstrated good participation throughout session but presents with decreased activity tolerance as seen by limited amb distance and requirements of rest breaks. Pt would benefit from skilled PT to address above deficits and promote optimal return to PLOF.        If plan is discharge home, recommend the following: A little help with walking and/or transfers;A little help with bathing/dressing/bathroom;Assist for transportation;Help with stairs or ramp for entrance   Can travel by private vehicle        Equipment Recommendations None recommended by PT  Recommendations for Other Services       Functional Status Assessment Patient has had a recent decline in their functional status and demonstrates the ability to make significant improvements in function in a reasonable and predictable amount of time.     Precautions / Restrictions Precautions Precautions: Fall Restrictions Weight Bearing  Restrictions: No      Mobility  Bed Mobility               General bed mobility comments: Unable to assess during session due to Pt received in recliner at beginning of session    Transfers Overall transfer level: Needs assistance Equipment used: 1 person hand held assist Transfers: Sit to/from Stand Sit to Stand: Contact guard assist           General transfer comment: Able to perform STS with use of 1 UE support with no noted difficulties    Ambulation/Gait Ambulation/Gait assistance: Contact guard assist Gait Distance (Feet): 75 Feet Assistive device: IV Pole, 1 person hand held assist Gait Pattern/deviations: Step-through pattern, Decreased stride length, Decreased step length - right, Decreased step length - left, Shuffle Gait velocity: decreased     General Gait Details: able to amb approx 75 ft using IV pole on L side but required 2x standing break due to increased WOB (SpO2 92-94% on 10L [place], HR 57-74 bpm throughout mobility); Pt required IV pole and 1 HHA for the last 10 ft secondary to increased fatigue and unsteadiness  Stairs            Wheelchair Mobility     Tilt Bed    Modified Rankin (Stroke Patients Only)       Balance Overall balance assessment: Modified Independent                                           Pertinent Vitals/Pain Pain Assessment Pain  Assessment: No/denies pain (Reports slight WOB in rest position)    Home Living Family/patient expects to be discharged to:: Private residence Living Arrangements: Children Available Help at Discharge: Family;Available PRN/intermittently Type of Home: Apartment Home Access: Level entry       Home Layout: One level Home Equipment: Agricultural consultant (2 wheels);Rollator (4 wheels);Shower seat - built in;Wheelchair - manual;Cane - single point;Other (comment) Additional Comments: Pt lives with son who works 12-hour shifts during the day ~3x/week    Prior Function  Prior Level of Function : Independent/Modified Independent             Mobility Comments: independent in home with use of RW and 2L O2 primarily at night but son reports she will use O2 during the day on occasion. ADLs Comments: Independent with ADL/IADL management, driving, walks to mail box each day. Denies falls history.     Extremity/Trunk Assessment   Upper Extremity Assessment Upper Extremity Assessment: Generalized weakness    Lower Extremity Assessment Lower Extremity Assessment: Generalized weakness       Communication   Communication Communication: Other (comment) (Deaf, ASL) Cueing Techniques: Gestural cues;Visual cues  Cognition Arousal: Alert Behavior During Therapy: WFL for tasks assessed/performed Overall Cognitive Status: Within Functional Limits for tasks assessed                                 General Comments: AO x4; Pt can mouth words faintly but uses son to assist with translation secondary to Pt being deaf        General Comments General comments (skin integrity, edema, etc.): Able to sustain static and dynamic seated edge of recliner balance during functional activities    Exercises     Assessment/Plan    PT Assessment Patient needs continued PT services  PT Problem List Decreased strength;Decreased mobility;Decreased activity tolerance;Decreased balance       PT Treatment Interventions DME instruction;Gait training;Functional mobility training;Therapeutic activities;Therapeutic exercise;Balance training    PT Goals (Current goals can be found in the Care Plan section)  Acute Rehab PT Goals Patient Stated Goal: to go home PT Goal Formulation: With patient Time For Goal Achievement: 04/10/23 Potential to Achieve Goals: Good    Frequency Min 1X/week     Co-evaluation               AM-PAC PT "6 Clicks" Mobility  Outcome Measure Help needed turning from your back to your side while in a flat bed without using  bedrails?: A Little Help needed moving from lying on your back to sitting on the side of a flat bed without using bedrails?: A Little Help needed moving to and from a bed to a chair (including a wheelchair)?: A Little Help needed standing up from a chair using your arms (e.g., wheelchair or bedside chair)?: A Little Help needed to walk in hospital room?: A Little Help needed climbing 3-5 steps with a railing? : A Lot 6 Click Score: 17    End of Session Equipment Utilized During Treatment: Gait belt;Oxygen Activity Tolerance: Patient limited by fatigue Patient left: in chair;with call bell/phone within reach;with chair alarm set;with family/visitor present Nurse Communication: Mobility status PT Visit Diagnosis: Unsteadiness on feet (R26.81);Muscle weakness (generalized) (M62.81)    Time: 0940-1005 PT Time Calculation (min) (ACUTE ONLY): 25 min   Charges:     PT Treatments $Gait Training: 23-37 mins PT General Charges $$ ACUTE PT VISIT: 1 Visit  Elmon Else, SPT   Kayleigh Broadwell 03/27/2023, 11:56 AM

## 2023-03-27 NOTE — Progress Notes (Signed)
Patient refusing EEG.. notified MD

## 2023-03-27 NOTE — Plan of Care (Signed)
  Problem: Coping: Goal: Ability to adjust to condition or change in health will improve Outcome: Progressing   

## 2023-03-28 DIAGNOSIS — J189 Pneumonia, unspecified organism: Secondary | ICD-10-CM | POA: Diagnosis not present

## 2023-03-28 DIAGNOSIS — J9621 Acute and chronic respiratory failure with hypoxia: Secondary | ICD-10-CM | POA: Diagnosis not present

## 2023-03-28 DIAGNOSIS — J441 Chronic obstructive pulmonary disease with (acute) exacerbation: Secondary | ICD-10-CM | POA: Diagnosis not present

## 2023-03-28 DIAGNOSIS — G40909 Epilepsy, unspecified, not intractable, without status epilepticus: Secondary | ICD-10-CM | POA: Diagnosis not present

## 2023-03-28 LAB — BASIC METABOLIC PANEL
Anion gap: 8 (ref 5–15)
BUN: 20 mg/dL (ref 8–23)
CO2: 26 mmol/L (ref 22–32)
Calcium: 8.7 mg/dL — ABNORMAL LOW (ref 8.9–10.3)
Chloride: 107 mmol/L (ref 98–111)
Creatinine, Ser: 0.72 mg/dL (ref 0.44–1.00)
GFR, Estimated: >60 mL/min (ref >60)
Glucose, Bld: 58 mg/dL — ABNORMAL LOW (ref 70–99)
Potassium: 3.3 mmol/L — ABNORMAL LOW (ref 3.5–5.1)
Sodium: 141 mmol/L (ref 135–145)

## 2023-03-28 LAB — GLUCOSE, CAPILLARY
Glucose-Capillary: 85 mg/dL (ref 70–99)
Glucose-Capillary: 114 mg/dL — ABNORMAL HIGH (ref 70–99)
Glucose-Capillary: 273 mg/dL — ABNORMAL HIGH (ref 70–99)
Glucose-Capillary: 178 mg/dL — ABNORMAL HIGH (ref 70–99)

## 2023-03-28 MED ORDER — IPRATROPIUM-ALBUTEROL 0.5-2.5 (3) MG/3ML IN SOLN
3.0000 mL | Freq: Three times a day (TID) | RESPIRATORY_TRACT | Status: DC
  Administered 2023-03-28 – 2023-03-29 (×4): 3 mL via RESPIRATORY_TRACT
  Filled 2023-03-28 (×3): qty 3

## 2023-03-28 MED ORDER — TRAMADOL HCL 50 MG PO TABS
50.0000 mg | ORAL_TABLET | Freq: Two times a day (BID) | ORAL | Status: DC | PRN
  Administered 2023-03-28: 50 mg via ORAL
  Filled 2023-03-28: qty 1

## 2023-03-28 NOTE — Plan of Care (Signed)

## 2023-03-28 NOTE — Progress Notes (Signed)
[facility]  - [facility]   PATIENT NAME: [name]    MR#:  [id number]  DATE OF BIRTH:  03/29/1951  SUBJECTIVE:  no family at bedside during my evaluation. Left message for [name] patient's son. Patient feels a whole lot better. Cough improved. Eating well. No respiratory distress. Has some chronic neck pain.    VITALS:  Blood pressure (!) 154/62, pulse (!) 57, temperature 98.3 F (36.8 C), resp. rate (!) 21, weight 72.8 kg, SpO2 92%.  PHYSICAL EXAMINATION:   GENERAL:  72 y.o.-year-old patient with no acute distress.Deaf  LUNGS:decreased breath sounds bilaterally, no wheezing CARDIOVASCULAR: S1, S2 normal. No murmur   ABDOMEN: Soft, nontender, nondistended. EXTREMITIES: + edema b/l.    NEUROLOGIC: nonfocal  patient is alert and awake  LABORATORY PANEL:  CBC Recent Labs  Lab 03/25/23 0757  WBC 9.5  HGB 14.8  HCT 44.7  PLT 250    Chemistries  Recent Labs  Lab 03/25/23 0757 03/28/23 0548  NA 140 141  K 3.6 3.3*  CL 105 107  CO2 23 26  GLUCOSE 179* 58*  BUN 15 20  CREATININE 0.65 0.72  CALCIUM 8.7* 8.7*  AST 19  --   ALT 11  --   ALKPHOS 104  --   BILITOT 0.9  --     Assessment and Plan   [name] is a 72 y.o. female with medical history significant of COPD Gold stage III, chronic HFpEF, mild/moderate AS, chronic hypoxic respiratory failure on 2 L at bedtime, OSA not tolerating CPAP at bedtime, deaf, IIDM, presented with worsening of cough wheezing shortness of breath.  EMS arrived and found patient O2 saturation lower 80s and patient was placed on CPAP.   Acute on chronic hypoxic respiratory failure Lobar mass like consolidation  -Likely secondary to combination effect of COPD exacerbation and mass like left lobar pneumonia vs malignancy  --this it pt's 3rd round of abxs -Treat COPD exacerbation IV Solu-Medrol, breathing treatment guaifenesin incentive spirometry -Wean oxygen as tolerated --seen by [facility] dr  A--PET scan vs Bronch as out pt at later date  --procalcitonin negative, wbc normal -added Mucinex   Acute COPD exacerbation -As above -- patient desaturated into the upper 80s with occupational therapy with ambulation improved to 93% with 5 L nasal cannula oxygen. Patient will require oxygen during daytime as well.   Question of breakthrough seizure versus nonepileptic seizure -No direct evidence of seizure activity en route to hospital.  -- Case was discussed with on-call neurology Dr. Wilford Corner, who is familiar with the patient as outpatient and he recommended no change of current antiseizure regimen.  Her Keppra was recently discontinued due to worsening of lethargy. -- Patient refused to do EEG. -- No seizure reported   Chronic HFpEF Mild-moderate AS -Euvolemic, continue p.o. Lasix -Continue metoprolol, losartan and spironolactone   DM-II -SSI for now. -- Resume home meds  If remains stable discharge tomorrow. Patient in agreement.  Procedures: Family communication : left voicemail for son [name] Consults :[facility] pulm CODE STATUS: FULL DVT Prophylaxis :lovenox Level of care: Progressive Status is: Inpatient Remains inpatient appropriate because: respiratory failure, PNA    TOTAL TIME TAKING CARE OF THIS PATIENT: 35 minutes.  >50% time spent on counselling and coordination of care  Note: This dictation was prepared with Dragon dictation along with smaller phrase technology. Any transcriptional errors that result from this process are unintentional.  Enedina Finner M.D    [facility]  CC: Primary care physician; [name], NP

## 2023-03-29 DIAGNOSIS — J189 Pneumonia, unspecified organism: Secondary | ICD-10-CM | POA: Diagnosis not present

## 2023-03-29 DIAGNOSIS — I10 Essential (primary) hypertension: Secondary | ICD-10-CM

## 2023-03-29 DIAGNOSIS — J9621 Acute and chronic respiratory failure with hypoxia: Secondary | ICD-10-CM | POA: Diagnosis not present

## 2023-03-29 DIAGNOSIS — J441 Chronic obstructive pulmonary disease with (acute) exacerbation: Secondary | ICD-10-CM | POA: Diagnosis not present

## 2023-03-29 LAB — GLUCOSE, CAPILLARY: Glucose-Capillary: 101 mg/dL — ABNORMAL HIGH (ref 70–99)

## 2023-03-29 MED ORDER — ROPINIROLE HCL 0.5 MG PO TABS: 0.5000 mg | ORAL_TABLET | Freq: Every day | ORAL | 0 refills | Status: AC

## 2023-03-29 MED ORDER — TRAMADOL HCL 50 MG PO TABS: 50.0000 mg | ORAL_TABLET | Freq: Two times a day (BID) | ORAL | 0 refills | Status: DC | PRN

## 2023-03-29 MED ORDER — GUAIFENESIN ER 600 MG PO TB12: 600.0000 mg | ORAL_TABLET | Freq: Two times a day (BID) | ORAL | 0 refills | Status: AC

## 2023-03-29 MED ORDER — GUAIFENESIN ER 600 MG PO TB12: 600.0000 mg | ORAL_TABLET | Freq: Two times a day (BID) | ORAL | 0 refills | Status: DC

## 2023-03-29 NOTE — TOC Transition Note (Signed)
Transition of Care [facility]) - CM/SW Discharge Note   Patient Details  Name: [name] MRN: [id number] Date of Birth: 1951/04/15  Transition of [facility]) CM/SW Contact:  Bing Quarry, RN Phone Number: 03/29/2023, 9:45 AM   Clinical Narrative: 03/29/23: Patient has discharge to home with [facility] services, PT, and an increase in chronic home DME oxygen to 4L/[place] from 2L/[place].  First preference for [facility] was [facility] but per weekend on call they do not have the staff. [facility] accepted and son agreed to that change.   Spoke with son regarding this and DME home oxygen, which in in place via [employer] and has equipment that can handle the increase from 2L/[place] to 4L/[place]. He also has the contact number for any oxygen needs and has transport oxygen equipment at bedside with patient when RN CM spoke to him.   Patient has all needed DME at home per prior CM note.  Son will transport patient home. No SDOH alerts noted at this time that [facility] addresses. [facility] regular pharmacies are closed today so getting them at a commercial pharmacy such as [facility] was suggested with [facility] dual insurance.   Follow ups:  Ambulatory referral ordered this am for Lung Cancer Screening. DC Instructions in DC Summary:   F/u Dr Meredeth Ide in 10-14 days fro f/u CXR, ? Bronch F/u PCP in 1-2 weeks  Gabriel Cirri MSN RN CM  Transitions of Care Department [facility] [phone] Weekends Only   Final next level of care: Home w Home Health Services Barriers to Discharge: Barriers Resolved   Patient Goals and CMS Choice   Choice offered to / list presented to : Adult Children  Discharge Placement                    Name of family member notified: Bita, [place] (Son)  [phone] [facility]) (Son notified of discharge plan for today. At bedside with patient. Agreed to [facility].)    Discharge Plan and Services Additional resources added to the After Visit Summary for                  DME Arranged: Other see comment (Has all  needed DME at home and already set up with [employer] for home oxygen with an increase from chronic 2L/[place] to 4L/, which current equipment can handle and son had number and transport oxygen.) DME Agency: NA       HH Arranged: PT HH Agency: [facility] Date [facility] Agency Contacted: 03/29/23 Time HH Agency Contacted: [phone] Representative spoke with at [facility] Agency: Kandee Keen  Social Determinants of Health (SDOH) Interventions SDOH Screenings   Food Insecurity: No Food Insecurity (03/25/2023)  Housing: Low Risk  (03/25/2023)  Transportation Needs: No Transportation Needs (03/25/2023)  Utilities: Not At Risk (03/25/2023)  Depression (PHQ2-9): Low Risk  (01/19/2023)  Recent Concern: Depression (PHQ2-9) - High Risk (11/19/2022)  Financial Resource Strain: Low Risk  (03/24/2023)   Received from [facility]  Tobacco Use: High Risk (03/27/2023)     Readmission Risk Interventions    03/26/2023    4:14 PM  Readmission Risk Prevention Plan  Transportation Screening Complete  PCP or Specialist Appt within 3-5 Days Complete  HRI or Home Care Consult Complete  Social Work Consult for Recovery Care Planning/Counseling Complete  Palliative Care Screening Not Applicable  Medication Review Oceanographer) Complete

## 2023-03-29 NOTE — Discharge Summary (Addendum)
Physician Discharge Summary   Patient: [name] MRN: [id number] DOB: [birth year]  Admit date:     03/25/2023  Discharge date: 03/29/23  Discharge Physician: Enedina Finner   PCP: [name], NP   Recommendations at discharge:    F/u Dr Meredeth Ide in 10-14 days fro f/u CXR, ? Bronch F/u PCP in 1-2 weeks  Discharge Diagnoses: Principal Problem:   Pneumonia Active Problems:   Seizure disorder (HCC)   COPD exacerbation (HCC)   Essential hypertension   COPD (chronic obstructive pulmonary disease) (HCC)   Community acquired pneumonia of left lower lobe of lung   [name] is a 72 y.o. female with medical history significant of COPD Gold stage III, chronic HFpEF, mild/moderate AS, chronic hypoxic respiratory failure on 2 L at bedtime, OSA not tolerating CPAP at bedtime, deaf, IIDM, presented with worsening of cough wheezing shortness of breath.  EMS arrived and found patient O2 saturation lower 80s and patient was placed on CPAP.    Acute on chronic hypoxic respiratory failure Lobar mass like consolidation  -Likely secondary to combination effect of COPD exacerbation and mass like left lobar pneumonia vs malignancy  --this it pt's 3rd round of abxs -Treat COPD exacerbation IV Solu-Medrol, breathing treatment guaifenesin incentive spirometry -Wean oxygen as tolerated --seen by [facility] dr A--PET scan vs Bronch as out pt at later date  --procalcitonin negative, wbc normal -added Mucinex --overall feels at baseline. Completed IV rocephin x4 days. Finish steroid, remainder of Cefuroxime (rx given recently) doxycycline at home. --Eager to go home!   Acute COPD exacerbation Ongoing Tobacco abuse -As above -- patient desaturated into the upper 80s with occupational therapy with ambulation improved to 93% with 5 L nasal cannula oxygen. Patient will require oxygen during daytime as well. --advised smoking cessation--pt agreeable   Question of breakthrough seizure versus nonepileptic  seizure -No direct evidence of seizure activity en route to hospital.  -- Case was discussed with on-call neurology Dr. Wilford Corner, who is familiar with the patient as outpatient and he recommended no change of current antiseizure regimen.  Her Keppra was recently discontinued due to worsening of lethargy. -- Patient refused to do EEG. -- No seizure reported   Chronic HFpEF Mild-moderate AS -Euvolemic, continue p.o. Lasix -Continue metoprolol, losartan and spironolactone   DM-II -SSI for now. -- Resume home meds   Discharge home Patient in agreement.   Family communication :son Twylla Ziegenfuss at bedside Consults :[facility] pulm CODE STATUS: FULL DVT Prophylaxis :lovenox     Pain control - [facility] Controlled Substance Reporting System database was reviewed. and patient was instructed, not to drive, operate heavy machinery, perform activities at heights, swimming or participation in water activities or provide baby-sitting services while on Pain, Sleep and Anxiety Medications; until their outpatient Physician has advised to do so again. Also recommended to not to take more than prescribed Pain, Sleep and Anxiety Medications.  Diet recommendation:  Discharge Diet Orders (From admission, onward)     Start     Ordered   03/29/23 0000  Diet - low sodium heart healthy        03/29/23 0829            DISCHARGE MEDICATION: Allergies as of 03/29/2023       Reactions   Celebrex [celecoxib] Itching   itching   Ciprofloxacin Itching   Codeine Itching   Fosphenytoin Itching, Other (See Comments)   Levaquin [levofloxacin In D5w] Itching   Levofloxacin Itching   Lovastatin Itching  Pravastatin Itching   Sulfa Antibiotics Itching   Aspirin Itching, Rash   Azithromycin Rash   Penicillins Rash   Documentation indicates severe reaction Pt tolerated cephalosporin without adverse reaction 09/18        Medication List     STOP taking these medications    Tiotropium Bromide  Monohydrate 2.5 MCG/ACT Aers       TAKE these medications    Breo Ellipta 100-25 MCG/ACT Aepb Generic drug: fluticasone furoate-vilanterol Inhale 1 puff into the lungs daily.   cefUROXime 500 MG tablet Commonly known as: CEFTIN Take 500 mg by mouth 2 (two) times daily. X 10 days   citalopram 10 MG tablet Commonly known as: CELEXA TAKE 1 TABLET BY MOUTH DAILY   clopidogrel 75 MG tablet Commonly known as: PLAVIX TAKE 1 TABLET BY MOUTH DAILY   doxycycline 100 MG tablet Commonly known as: VIBRA-TABS Take 100 mg by mouth 2 (two) times daily.   fluticasone 50 MCG/ACT nasal spray Commonly known as: FLONASE Place 1 spray into both nostrils 2 (two) times daily.   furosemide 40 MG tablet Commonly known as: LASIX Take 1 tablet (40 mg total) by mouth daily.   gabapentin 100 MG capsule Commonly known as: NEURONTIN Take 200 mg by mouth at bedtime.   glimepiride 2 MG tablet Commonly known as: AMARYL TAKE 1 TABLET BY MOUTH DAILY FOR DIABETES   guaiFENesin 600 MG 12 hr tablet Commonly known as: MUCINEX Take 1 tablet (600 mg total) by mouth 2 (two) times daily for 3 days.   ipratropium-albuterol 0.5-2.5 (3) MG/3ML Soln Commonly known as: DUONEB INHALE 1 VIAL USING NEBULIZER EVERY SIX HOURS AS NEEDED FOR SHORTNESS OF BREATH   isosorbide mononitrate 30 MG 24 hr tablet Commonly known as: IMDUR Take 30 mg by mouth daily.   losartan 100 MG tablet Commonly known as: COZAAR TAKE 1 TABLET BY MOUTH DAILY AS DIRECTED   metoprolol succinate 25 MG 24 hr tablet Commonly known as: TOPROL-XL TAKE 1 TABLET BY MOUTH DAILY   montelukast 10 MG tablet Commonly known as: SINGULAIR TAKE 1 TABLET BY MOUTH AT BEDTIME   pantoprazole 40 MG tablet Commonly known as: PROTONIX TAKE 1 TABLET BY MOUTH DAILY FOR REFLUX/HEARTBURN   potassium chloride 10 MEQ tablet Commonly known as: KLOR-CON Take 10 mEq by mouth 2 (two) times daily.   predniSONE 10 MG tablet Commonly known as:  DELTASONE Take 10-40 mg by mouth as directed. 4 pills q day x 2 days, 3 pills q day x 2 days, 2 pills q day x 2 days, 1 pill q day x 2 days. Notes to patient: Start taking 30 mg twice a day on 9/24   rOPINIRole 0.5 MG tablet Commonly known as: REQUIP Take 1 tablet (0.5 mg total) by mouth at bedtime.   rosuvastatin 10 MG tablet Commonly known as: CRESTOR TAKE 1 TABLET BY MOUTH AT BEDTIME   spironolactone 25 MG tablet Commonly known as: ALDACTONE TAKE 1 TABLET BY MOUTH EVERY MORNING   traMADol 50 MG tablet Commonly known as: ULTRAM Take 1 tablet (50 mg total) by mouth every 12 (twelve) hours as needed for moderate pain or severe pain.   Vimpat 150 MG Tabs Generic drug: Lacosamide Take 150 mg by mouth every 12 (twelve) hours.               Durable Medical Equipment  (From admission, onward)           Start     Ordered   03/28/23 1451  For home use only DME oxygen  Once       Question Answer Comment  Length of Need Lifetime   Mode or (Route) Nasal cannula   Liters per Minute 4   Frequency Continuous (stationary and portable oxygen unit needed)   Oxygen conserving device Yes   Oxygen delivery system Gas      03/28/23 1451            Follow-up Information     [name], NP. Schedule an appointment as soon as possible for a visit in 1 week(s).   Specialty: Nurse Practitioner Contact information: [address] [place] [address] [place] [address] [phone]         Mertie Moores, MD Follow up in 10 day(s).   Specialty: Specialist Why: f/u left mass like consolidation Contact information: [address] [facility] [place] - PULMONOLOGY [place] [address] [phone]                Discharge Exam: Filed Weights   03/25/23 0752  Weight: 72.8 kg  GENERAL:  72 y.o.-year-old patient with no acute distress.Deaf  LUNGS:decreased breath sounds bilaterally, no wheezing CARDIOVASCULAR: S1, S2 normal. No murmur   ABDOMEN:  Soft, nontender, nondistended. EXTREMITIES: + edema b/l.    NEUROLOGIC: nonfocal  patient is alert and awake   Condition at discharge: fair  The results of significant diagnostics from this hospitalization (including imaging, microbiology, ancillary and laboratory) are listed below for reference.   Imaging Studies: CT CHEST WO CONTRAST  Result Date: 03/25/2023 CLINICAL DATA:  Respiratory distress EXAM: CT CHEST WITHOUT CONTRAST TECHNIQUE: Multidetector CT imaging of the chest was performed following the standard protocol without IV contrast. RADIATION DOSE REDUCTION: This exam was performed according to the departmental dose-optimization program which includes automated exposure control, adjustment of the mA and/or kV according to patient size and/or use of iterative reconstruction technique. COMPARISON:  08/15/2017 FINDINGS: Cardiovascular: Heart size normal. Small pericardial effusion, stable. 3-vessel coronary calcifications. Calcified plaque in the aortic arch and descending thoracic segment without aneurysm. Mediastinum/Nodes: No mass or adenopathy. Leftward mediastinal shift secondary to volume loss from the left lung. Lungs/Pleura: Masslike perihilar consolidation in the left lower lung with volume loss and air bronchograms. Scattered surrounding ground-glass and airspace opacities. Dependent consolidation/atelectasis in the left lower lobe. There is dense consolidation in the lingula with some linear subsegmental atelectasis in the posterior left upper lobe. Breathing motion degrades evaluation through the hila. Hyperinflation of right lung across the midline, without focal lesion. Upper Abdomen: Cholecystectomy clips.  No acute findings. Musculoskeletal: Post right shoulder arthroplasty. No acute findings. IMPRESSION: 1. Masslike perihilar consolidation in the left lower lung with surrounding ground-glass and airspace opacities, possibly infectious/inflammatory. Consider pulmonary consultation.  2. Stable small pericardial effusion. 3. Coronary and  Aortic Atherosclerosis (ICD10-I70.0). Electronically Signed   By: Corlis Leak M.D.   On: 03/25/2023 13:08   MR BRAIN WO CONTRAST  Result Date: 03/25/2023 CLINICAL DATA:  Memory loss. EXAM: MRI HEAD WITHOUT CONTRAST TECHNIQUE: Multiplanar, multiecho pulse sequences of the brain and surrounding structures were obtained without intravenous contrast. COMPARISON:  MRI brain 04/02/2022. FINDINGS: Brain: No acute infarct or hemorrhage. Unchanged mild chronic small-vessel disease and generalized volume loss within expected range for patient age. No mass or midline shift. No hydrocephalus or extra-axial collection. No abnormal susceptibility. Vascular: Normal flow voids. Skull and upper cervical spine: Normal marrow signal. Sinuses/Orbits: No acute findings. Other: None. IMPRESSION: 1. No acute intracranial abnormality or mass. 2. Unchanged mild  chronic small-vessel disease and generalized volume loss within expected range for patient age. Electronically Signed   By: Orvan Falconer M.D.   On: 03/25/2023 08:38   DG Chest Port 1 View  Result Date: 03/25/2023 CLINICAL DATA:  Increased shortness of breath. Diagnosed with pneumonia yesterday and started on antibiotics. EXAM: PORTABLE CHEST 1 VIEW COMPARISON:  Chest radiograph 02/24/2023. FINDINGS: Central pulmonary venous congestion without overt pulmonary edema. Retrocardiac opacity is favored to reflect atelectasis given leftward mediastinal shift. No pleural effusion or pneumothorax. IMPRESSION: 1. Central pulmonary venous congestion without overt pulmonary edema. 2. Retrocardiac opacity is favored to reflect atelectasis given leftward mediastinal shift. Electronically Signed   By: Orvan Falconer M.D.   On: 03/25/2023 08:34    Microbiology: Results for orders placed or performed during the hospital encounter of 03/25/23  Blood Culture (routine x 2)     Status: None (Preliminary result)   Collection Time:  03/25/23  7:56 AM   Specimen: BLOOD  Result Value Ref Range Status   Specimen Description BLOOD RIGHT ANTECUBITAL  Final   Special Requests   Final    BOTTLES DRAWN AEROBIC AND ANAEROBIC Blood Culture adequate volume   Culture   Final    NO GROWTH 4 DAYS Performed at [facility], [address] [place], [place] [address]    Report Status PENDING  Incomplete  Resp panel by RT-PCR (RSV, Flu A&B, Covid) Anterior Nasal Swab     Status: None   Collection Time: 03/25/23  7:57 AM   Specimen: Anterior Nasal Swab  Result Value Ref Range Status   SARS Coronavirus 2 by RT PCR NEGATIVE NEGATIVE Final    Comment: (NOTE) SARS-CoV-2 target nucleic acids are NOT DETECTED.  The SARS-CoV-2 RNA is generally detectable in upper respiratory specimens during the acute phase of infection. The lowest concentration of SARS-CoV-2 viral copies this assay can detect is 138 copies/mL. A negative result does not preclude SARS-Cov-2 infection and should not be used as the sole basis for treatment or other patient management decisions. A negative result may occur with  improper specimen collection/handling, submission of specimen other than nasopharyngeal swab, presence of viral mutation(s) within the areas targeted by this assay, and inadequate number of viral copies(<138 copies/mL). A negative result must be combined with clinical observations, patient history, and epidemiological information. The expected result is Negative.  Fact Sheet for Patients:  BloggerCourse.com  Fact Sheet for Healthcare Providers:  SeriousBroker.it  This test is no t yet approved or cleared by the [place] FDA and  has been authorized for detection and/or diagnosis of SARS-CoV-2 by FDA under an Emergency Use Authorization (EUA). This EUA will remain  in effect (meaning this test can be used) for the duration of the COVID-19 declaration under Section 564(b)(1) of  the Act, 21 U.S.C.section 360bbb-3(b)(1), unless the authorization is terminated  or revoked sooner.       Influenza A by PCR NEGATIVE NEGATIVE Final   Influenza B by PCR NEGATIVE NEGATIVE Final    Comment: (NOTE) The Xpert Xpress SARS-CoV-2/FLU/RSV plus assay is intended as an aid in the diagnosis of influenza from Nasopharyngeal swab specimens and should not be used as a sole basis for treatment. Nasal washings and aspirates are unacceptable for Xpert Xpress SARS-CoV-2/FLU/RSV testing.  Fact Sheet for Patients: BloggerCourse.com  Fact Sheet for Healthcare Providers: SeriousBroker.it  This test is not yet approved or cleared by the [place] FDA and has been authorized for detection and/or diagnosis of SARS-CoV-2 by FDA  under an Emergency Use Authorization (EUA). This EUA will remain in effect (meaning this test can be used) for the duration of the COVID-19 declaration under Section 564(b)(1) of the Act, 21 U.S.C. section 360bbb-3(b)(1), unless the authorization is terminated or revoked.     Resp Syncytial Virus by PCR NEGATIVE NEGATIVE Final    Comment: (NOTE) Fact Sheet for Patients: BloggerCourse.com  Fact Sheet for Healthcare Providers: SeriousBroker.it  This test is not yet approved or cleared by the [place] FDA and has been authorized for detection and/or diagnosis of SARS-CoV-2 by FDA under an Emergency Use Authorization (EUA). This EUA will remain in effect (meaning this test can be used) for the duration of the COVID-19 declaration under Section 564(b)(1) of the Act, 21 U.S.C. section 360bbb-3(b)(1), unless the authorization is terminated or revoked.  Performed at [facility], [address] [place], [place] [address]   Blood Culture (routine x 2)     Status: None (Preliminary result)   Collection Time: 03/25/23  7:57 AM   Specimen: BLOOD  LEFT FOREARM  Result Value Ref Range Status   Specimen Description BLOOD LEFT FOREARM  Final   Special Requests   Final    BOTTLES DRAWN AEROBIC AND ANAEROBIC Blood Culture adequate volume   Culture   Final    NO GROWTH 4 DAYS Performed at [facility], [address] [place], [place] [address]    Report Status PENDING  Incomplete    Labs: CBC: Recent Labs  Lab 03/25/23 0757  WBC 9.5  NEUTROABS 4.9  HGB 14.8  HCT 44.7  MCV 81.1  PLT 250   Basic Metabolic Panel: Recent Labs  Lab 03/25/23 0757 03/28/23 0548  NA 140 141  K 3.6 3.3*  CL 105 107  CO2 23 26  GLUCOSE 179* 58*  BUN 15 20  CREATININE 0.65 0.72  CALCIUM 8.7* 8.7*   Liver Function Tests: Recent Labs  Lab 03/25/23 0757  AST 19  ALT 11  ALKPHOS 104  BILITOT 0.9  PROT 7.3  ALBUMIN 3.5   CBG: Recent Labs  Lab 03/28/23 0740 03/28/23 1159 03/28/23 1630 03/28/23 2110 03/29/23 0832  GLUCAP 85 114* 273* 178* 101*    Discharge time spent: greater than 30 minutes.  Signed: Enedina Finner, MD [facility] 03/29/2023

## 2023-03-29 NOTE — Discharge Instructions (Signed)
Use your oxygen, nebulizer, inhalers as before

## 2023-03-30 ENCOUNTER — Telehealth: Payer: Self-pay

## 2023-03-30 ENCOUNTER — Ambulatory Visit: Payer: 59

## 2023-03-30 LAB — CULTURE, BLOOD (ROUTINE X 2)
Culture: NO GROWTH
Culture: NO GROWTH
Special Requests: ADEQUATE
Special Requests: ADEQUATE

## 2023-03-30 NOTE — Transitions of Care (Post Inpatient/ED Visit) (Unsigned)
03/30/2023  Name: [name] MRN: [id number] DOB: 1951-02-23  Today's TOC FU Call Status: Today's TOC FU Call Status:: Unsuccessful Call (1st Attempt) Unsuccessful Call (1st Attempt) Date: 03/30/23  Attempted to reach the patient regarding the most recent Inpatient/ED visit.  Follow Up Plan: Additional outreach attempts will be made to reach the patient to complete the Transitions of Care (Post Inpatient/ED visit) call.   Signature Karena Addison, LPN [facility] Nurse Health Advisor Direct Dial [phone]

## 2023-03-31 NOTE — Transitions of Care (Post Inpatient/ED Visit) (Unsigned)
03/31/2023  Name: [name] MRN: [id number] DOB: 12/14/1950  Today's TOC FU Call Status: Today's TOC FU Call Status:: Unsuccessful Call (2nd Attempt) Unsuccessful Call (1st Attempt) Date: 03/30/23 Unsuccessful Call (2nd Attempt) Date: 03/31/23  Attempted to reach the patient regarding the most recent Inpatient/ED visit.  Follow Up Plan: Additional outreach attempts will be made to reach the patient to complete the Transitions of Care (Post Inpatient/ED visit) call.   Signature Karena Addison, LPN [facility] Nurse Health Advisor Direct Dial [phone]

## 2023-04-01 NOTE — Transitions of Care (Post Inpatient/ED Visit) (Signed)
04/01/2023  Name: [name] MRN: [id number] DOB: 04-28-1951  Today's TOC FU Call Status: Today's TOC FU Call Status:: Unsuccessful Call (3rd Attempt) Unsuccessful Call (1st Attempt) Date: 03/30/23 Unsuccessful Call (2nd Attempt) Date: 03/31/23 Unsuccessful Call (3rd Attempt) Date: 04/01/23  Attempted to reach the patient regarding the most recent Inpatient/ED visit.  Follow Up Plan: No further outreach attempts will be made at this time. We have been unable to contact the patient.  Signature Karena Addison, LPN [facility] Nurse Health Advisor Direct Dial [phone]

## 2023-04-14 ENCOUNTER — Telehealth: Payer: Self-pay

## 2023-04-14 ENCOUNTER — Ambulatory Visit: Payer: 59 | Admitting: Nurse Practitioner

## 2023-04-14 ENCOUNTER — Encounter: Payer: Self-pay | Admitting: Nurse Practitioner

## 2023-04-14 ENCOUNTER — Ambulatory Visit: Admission: RE | Admit: 2023-04-14 | Payer: 59 | Source: Ambulatory Visit | Admitting: *Deleted

## 2023-04-14 ENCOUNTER — Ambulatory Visit
Admission: RE | Admit: 2023-04-14 | Discharge: 2023-04-14 | Disposition: A | Discharge status: Home / Self Care | Payer: 59 | Source: Ambulatory Visit | Attending: Nurse Practitioner | Admitting: Nurse Practitioner

## 2023-04-14 VITALS — BP 114/70 | HR 93 | Temp 98.3°F | Ht 63.0 in | Wt 150.0 lb

## 2023-04-14 DIAGNOSIS — R0902 Hypoxemia: Secondary | ICD-10-CM | POA: Diagnosis not present

## 2023-04-14 DIAGNOSIS — J9811 Atelectasis: Secondary | ICD-10-CM | POA: Diagnosis not present

## 2023-04-14 DIAGNOSIS — E538 Deficiency of other specified B group vitamins: Secondary | ICD-10-CM | POA: Diagnosis not present

## 2023-04-14 DIAGNOSIS — J189 Pneumonia, unspecified organism: Secondary | ICD-10-CM

## 2023-04-14 DIAGNOSIS — Z96611 Presence of right artificial shoulder joint: Secondary | ICD-10-CM | POA: Diagnosis not present

## 2023-04-14 MED ORDER — CYANOCOBALAMIN 1000 MCG/ML IJ SOLN
1000.0000 ug | Freq: Once | INTRAMUSCULAR | Status: AC
Start: 2023-04-14 — End: 2023-04-14
  Administered 2023-04-14: 1000 ug via INTRAMUSCULAR

## 2023-04-14 NOTE — Telephone Encounter (Signed)
At check-out, we scheduled patient's follow-up appointment and first weekly B12 injection.  Patient's son states they will call to schedule the remaining B12 injection appointments after he checks his schedule.

## 2023-04-14 NOTE — Telephone Encounter (Signed)
Noted  

## 2023-04-14 NOTE — Progress Notes (Signed)
Karen Dicker, NP-C Phone: [phone]  [name] is a 72 y.o. female who presents today for hospital follow up.   Patient admitted to hospital on 03/25/2023 for pneumonia and COPD exacerbation. Her oxygen was noted to be in the low 80s when she arrived to the ED. She was also found to have a mass like consolidation in her left lung that is requiring further evaluation. Hospital notes, imaging and labs reviewed. She has not yet followed up with her Pulmonologist. She presents today feeling better but continuing to have shortness of breath. She completed all of her antibiotics as prescribed. She is not wearing her oxygen today.  She recently had lab work with Neurology and was informed that her B12 level is low, they are recommending she start B12 injections. She and her son are wondering if she can do them here instead of at [facility] as the office here is easier for her to get in and out of and requires less ambulation. Lab results reviewed.   Social History   Tobacco Use  Smoking Status Every Day   Current packs/day: 0.50   Average packs/day: 0.5 packs/day for 55.1 years (27.6 ttl pk-yrs)   Types: Cigarettes   Start date: 03/01/1968  Smokeless Tobacco Never    Current Outpatient Medications on File Prior to Visit  Medication Sig Dispense Refill   citalopram (CELEXA) 10 MG tablet TAKE 1 TABLET BY MOUTH DAILY 90 tablet 3   clopidogrel (PLAVIX) 75 MG tablet TAKE 1 TABLET BY MOUTH DAILY 90 tablet 1   fluticasone (FLONASE) 50 MCG/ACT nasal spray Place 1 spray into both nostrils 2 (two) times daily.     fluticasone furoate-vilanterol (BREO ELLIPTA) 100-25 MCG/ACT AEPB Inhale 1 puff into the lungs daily.     furosemide (LASIX) 40 MG tablet Take 1 tablet (40 mg total) by mouth daily. 90 tablet 3   gabapentin (NEURONTIN) 100 MG capsule Take 200 mg by mouth at bedtime.     glimepiride (AMARYL) 2 MG tablet TAKE 1 TABLET BY MOUTH DAILY FOR DIABETES 90 tablet 3    ipratropium-albuterol (DUONEB) 0.5-2.5 (3) MG/3ML SOLN INHALE 1 VIAL USING NEBULIZER EVERY SIX HOURS AS NEEDED FOR SHORTNESS OF BREATH 90 mL 1   isosorbide mononitrate (IMDUR) 30 MG 24 hr tablet Take 30 mg by mouth daily.     Lacosamide (VIMPAT) 150 MG TABS Take 150 mg by mouth every 12 (twelve) hours.     losartan (COZAAR) 100 MG tablet TAKE 1 TABLET BY MOUTH DAILY AS DIRECTED 90 tablet 3   metoprolol succinate (TOPROL-XL) 25 MG 24 hr tablet TAKE 1 TABLET BY MOUTH DAILY 90 tablet 3   montelukast (SINGULAIR) 10 MG tablet TAKE 1 TABLET BY MOUTH AT BEDTIME 90 tablet 3   pantoprazole (PROTONIX) 40 MG tablet TAKE 1 TABLET BY MOUTH DAILY FOR REFLUX/HEARTBURN 90 tablet 3   potassium chloride (KLOR-CON) 10 MEQ tablet Take 10 mEq by mouth 2 (two) times daily.     rOPINIRole (REQUIP) 0.5 MG tablet Take 1 tablet (0.5 mg total) by mouth at bedtime. 30 tablet 0   rosuvastatin (CRESTOR) 10 MG tablet TAKE 1 TABLET BY MOUTH AT BEDTIME 90 tablet 3   spironolactone (ALDACTONE) 25 MG tablet TAKE 1 TABLET BY MOUTH EVERY MORNING 90 tablet 0   traMADol (ULTRAM) 50 MG tablet Take 1 tablet (50 mg total) by mouth every 12 (twelve) hours as needed for moderate pain or severe pain. 15 tablet 0   doxycycline (VIBRA-TABS) 100 MG tablet Take  100 mg by mouth 2 (two) times daily. (Patient not taking: Reported on 04/14/2023)     No current facility-administered medications on file prior to visit.   ROS see history of present illness  Objective  Physical Exam Vitals:   04/14/23 0843 04/14/23 0856  BP:    Pulse:    Temp:    SpO2: 90% 92%    BP Readings from Last 3 Encounters:  04/14/23 114/70  03/29/23 104/80  03/19/23 136/80   Wt Readings from Last 3 Encounters:  04/14/23 150 lb (68 kg)  03/25/23 160 lb 7.9 oz (72.8 kg)  03/19/23 157 lb 6.4 oz (71.4 kg)    Physical Exam Constitutional:      General: She is not in acute distress.    Appearance: Normal appearance.  HENT:     Head: Normocephalic.   Cardiovascular:     Rate and Rhythm: Normal rate and regular rhythm.     Heart sounds: Normal heart sounds.  Pulmonary:     Effort: Tachypnea present.     Breath sounds: Wheezing present.  Skin:    General: Skin is warm and dry.  Neurological:     General: No focal deficit present.     Mental Status: She is alert.  Psychiatric:        Mood and Affect: Mood normal.        Behavior: Behavior normal.    Assessment/Plan: Please see individual problem list.  Community acquired pneumonia of left lower lobe of lung Assessment & Plan: Improvement in symptoms. Completed antibiotic. Continues to have coughing and shortness of breath- baseline. O2 in office on room air- 86% with improvement to 91% when wearing her oxygen. Will obtain repeat chest x-ray today. Strict precautions given to patient, if worsening shortness of breath, difficulty breathing or oxygen remaining below 90% she will seek immediate medical attention. Advised to wear her oxygen as directed. Encouraged to follow up with Pulmonology regarding mass like consolidation found during hospital stay.   Orders: -     DG Chest 2 View; Future  Hypoxia Assessment & Plan: 86% on room air, was not wearing her oxygen. Her son went out to the car and got her oxygen, after wearing O2 increased to 91%. Advised to wear her oxygen as directed. She will seek medical attention if oxygen staying below 90%. Follow up with Pulmonology.    B12 deficiency Assessment & Plan: Noted in recent lab work with [facility]. She will start B12 injections today, 1000 mcg once a week x 4 weeks then once a month. Will continue to monitor.   Orders: -     Cyanocobalamin    Return in about 3 months (around 07/15/2023) for Follow up.   Karen Dicker, NP-C [facility] [employer]

## 2023-04-17 ENCOUNTER — Encounter: Payer: Self-pay | Admitting: Nurse Practitioner

## 2023-04-20 ENCOUNTER — Ambulatory Visit (INDEPENDENT_AMBULATORY_CARE_PROVIDER_SITE_OTHER): Payer: 59

## 2023-04-20 ENCOUNTER — Ambulatory Visit: Payer: 59

## 2023-04-20 DIAGNOSIS — E538 Deficiency of other specified B group vitamins: Secondary | ICD-10-CM | POA: Diagnosis not present

## 2023-04-20 MED ORDER — CYANOCOBALAMIN 1000 MCG/ML IJ SOLN
1000.0000 ug | Freq: Once | INTRAMUSCULAR | Status: AC
Start: 2023-04-20 — End: 2023-04-20
  Administered 2023-04-20: 1000 ug via INTRAMUSCULAR

## 2023-04-20 NOTE — Progress Notes (Signed)
Patient presented for a B12 injection and it was administered into her left deltoid. Patient tolerated the injection well.

## 2023-04-23 NOTE — Assessment & Plan Note (Signed)
86% on room air, was not wearing her oxygen. Her son went out to the car and got her oxygen, after wearing O2 increased to 91%. Advised to wear her oxygen as directed. She will seek medical attention if oxygen staying below 90%. Follow up with Pulmonology.

## 2023-04-23 NOTE — Assessment & Plan Note (Addendum)
Improvement in symptoms. Completed antibiotic. Continues to have coughing and shortness of breath- baseline. O2 in office on room air- 86% with improvement to 91% when wearing her oxygen. Will obtain repeat chest x-ray today. Strict precautions given to patient, if worsening shortness of breath, difficulty breathing or oxygen remaining below 90% she will seek immediate medical attention. Advised to wear her oxygen as directed. Encouraged to follow up with Pulmonology regarding mass like consolidation found during hospital stay.

## 2023-04-23 NOTE — Assessment & Plan Note (Signed)
Noted in recent lab work with [facility]. She will start B12 injections today, 1000 mcg once a week x 4 weeks then once a month. Will continue to monitor.

## 2023-04-27 ENCOUNTER — Ambulatory Visit: Payer: 59

## 2023-04-27 ENCOUNTER — Ambulatory Visit (INDEPENDENT_AMBULATORY_CARE_PROVIDER_SITE_OTHER): Payer: 59

## 2023-04-27 DIAGNOSIS — E538 Deficiency of other specified B group vitamins: Secondary | ICD-10-CM | POA: Diagnosis not present

## 2023-04-27 MED ORDER — CYANOCOBALAMIN 1000 MCG/ML IJ SOLN
1000.0000 ug | Freq: Once | INTRAMUSCULAR | Status: AC
Start: 2023-04-27 — End: 2023-04-27
  Administered 2023-04-27: 1000 ug via INTRAMUSCULAR

## 2023-04-27 NOTE — Progress Notes (Signed)
Pt presents for weekly b12. Administered in right deltoid per patient request. Pt tolerated injection well. No complaints or concerns at this time.

## 2023-04-27 NOTE — Progress Notes (Deleted)
Pt presented for their vitamin B12 injection. Pt was identified through two identifiers. Pt tolerated shot well in their left or right deltoid.  

## 2023-04-30 DIAGNOSIS — E782 Mixed hyperlipidemia: Secondary | ICD-10-CM | POA: Diagnosis not present

## 2023-04-30 DIAGNOSIS — E1140 Type 2 diabetes mellitus with diabetic neuropathy, unspecified: Secondary | ICD-10-CM | POA: Diagnosis not present

## 2023-04-30 DIAGNOSIS — I2510 Atherosclerotic heart disease of native coronary artery without angina pectoris: Secondary | ICD-10-CM | POA: Diagnosis not present

## 2023-04-30 DIAGNOSIS — I10 Essential (primary) hypertension: Secondary | ICD-10-CM | POA: Diagnosis not present

## 2023-04-30 DIAGNOSIS — R0609 Other forms of dyspnea: Secondary | ICD-10-CM | POA: Diagnosis not present

## 2023-04-30 DIAGNOSIS — R0789 Other chest pain: Secondary | ICD-10-CM | POA: Diagnosis not present

## 2023-04-30 DIAGNOSIS — I2584 Coronary atherosclerosis due to calcified coronary lesion: Secondary | ICD-10-CM | POA: Diagnosis not present

## 2023-04-30 DIAGNOSIS — I350 Nonrheumatic aortic (valve) stenosis: Secondary | ICD-10-CM | POA: Diagnosis not present

## 2023-04-30 DIAGNOSIS — J449 Chronic obstructive pulmonary disease, unspecified: Secondary | ICD-10-CM | POA: Diagnosis not present

## 2023-05-07 ENCOUNTER — Ambulatory Visit: Payer: 59

## 2023-05-08 ENCOUNTER — Other Ambulatory Visit: Payer: Self-pay | Admitting: Nurse Practitioner

## 2023-05-08 DIAGNOSIS — E876 Hypokalemia: Secondary | ICD-10-CM

## 2023-05-11 ENCOUNTER — Ambulatory Visit: Payer: 59

## 2023-05-11 DIAGNOSIS — E538 Deficiency of other specified B group vitamins: Secondary | ICD-10-CM | POA: Diagnosis not present

## 2023-05-11 MED ORDER — CYANOCOBALAMIN 1000 MCG/ML IJ SOLN
1000.0000 ug | Freq: Once | INTRAMUSCULAR | Status: AC
Start: 2023-05-11 — End: 2023-05-11
  Administered 2023-05-11: 1000 ug via INTRAMUSCULAR

## 2023-05-11 MED ORDER — GABAPENTIN 100 MG PO CAPS: 200.0000 mg | ORAL_CAPSULE | Freq: Every day | ORAL | 3 refills | Status: DC

## 2023-05-11 NOTE — Progress Notes (Signed)
Pt presented for their vitamin B12 injection. Pt was identified through two identifiers. Pt tolerated shot well in their left  deltoid.  

## 2023-05-19 ENCOUNTER — Other Ambulatory Visit: Payer: Self-pay | Admitting: Nurse Practitioner

## 2023-05-19 DIAGNOSIS — J439 Emphysema, unspecified: Secondary | ICD-10-CM

## 2023-05-22 ENCOUNTER — Ambulatory Visit: Payer: 59 | Admitting: Nurse Practitioner

## 2023-05-27 ENCOUNTER — Telehealth: Payer: Self-pay | Admitting: Nurse Practitioner

## 2023-05-27 NOTE — Telephone Encounter (Signed)
Copied from CRM [id number]. Topic: Medicare AWV >> May 27, 2023  1:54 PM Payton Doughty wrote: Reason for CRM: Called LVM 05/27/2023 to schedule Annual Wellness Visit  Verlee Rossetti; Care Guide Ambulatory Clinical Support [place] [facility] Direct Dial: [phone]

## 2023-06-02 IMAGING — MG MM DIGITAL SCREENING BILAT W/ TOMO AND CAD
8 series · 8 of 24 positions shown · non-contrast
Comparison: Previous exam(s).

CLINICAL DATA: Screening.

EXAM:
DIGITAL SCREENING BILATERAL MAMMOGRAM WITH TOMOSYNTHESIS AND CAD
TECHNIQUE: Bilateral screening digital craniocaudal and mediolateral oblique
mammograms were obtained. Bilateral screening digital breast
tomosynthesis was performed. The images were evaluated with
computer-aided detection.

[R MLO synth-2D]
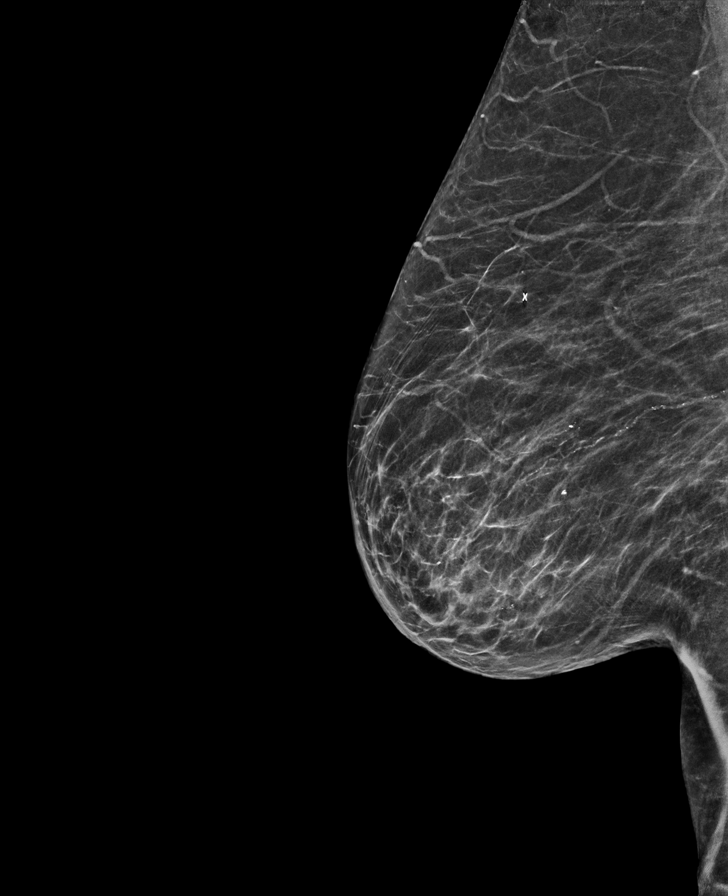

[L MLO synth-2D]
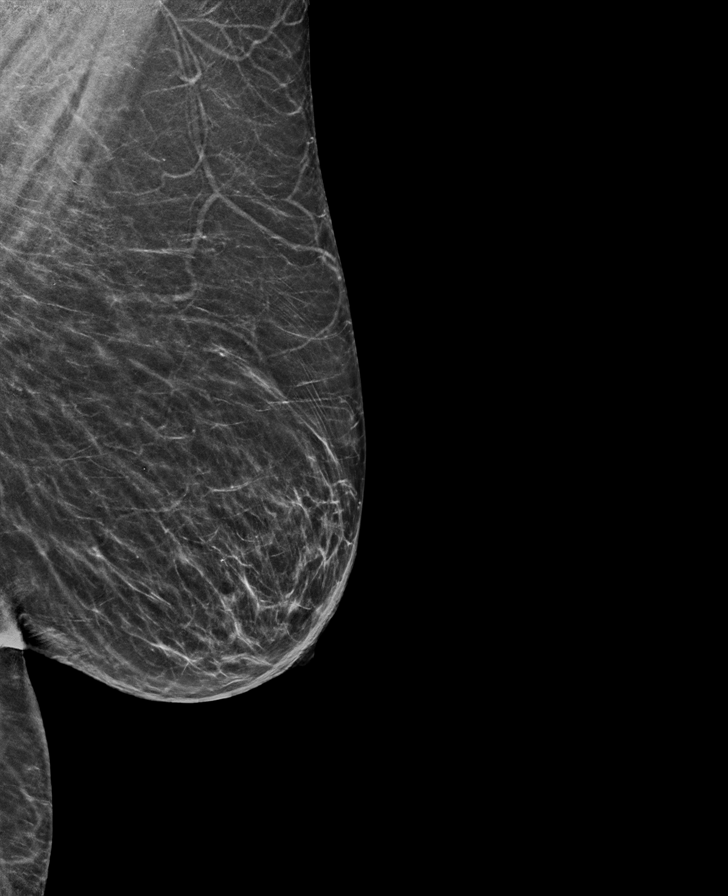

[R CC synth-2D]
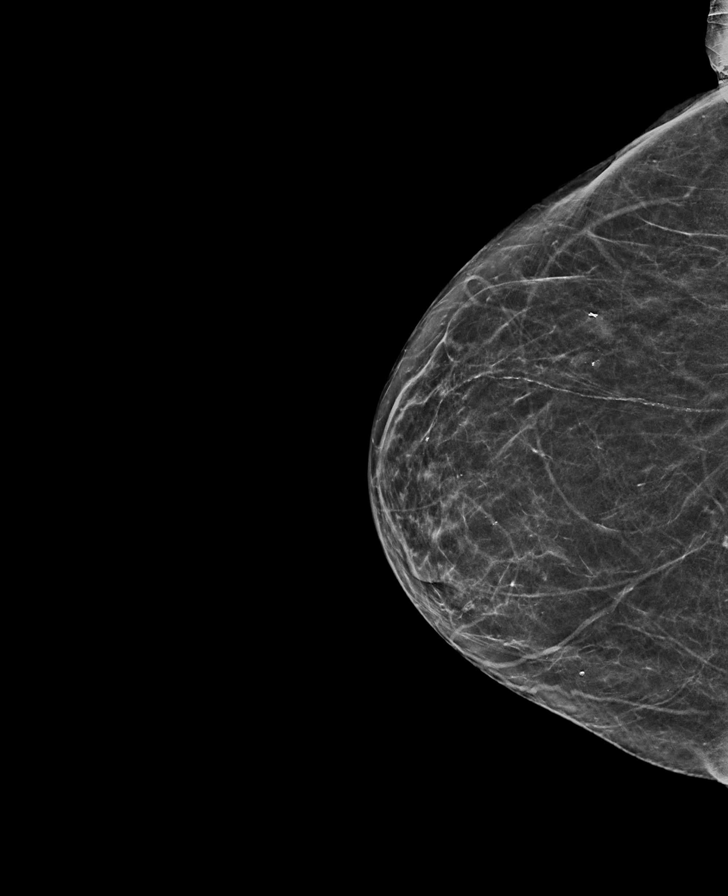

[L CC synth-2D]
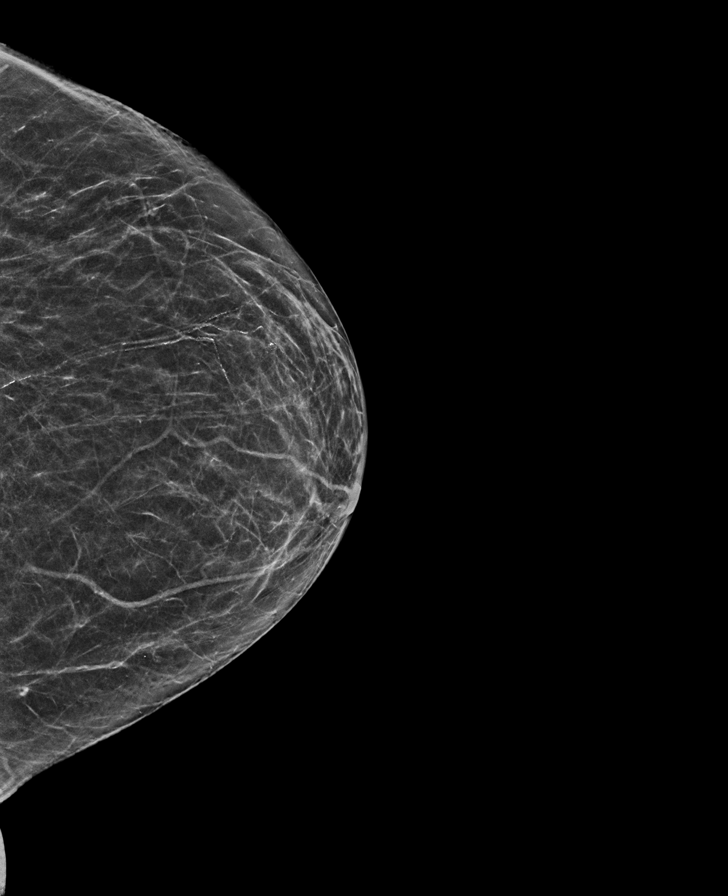

[L MLO tomo · tomo slice 34/67.0]
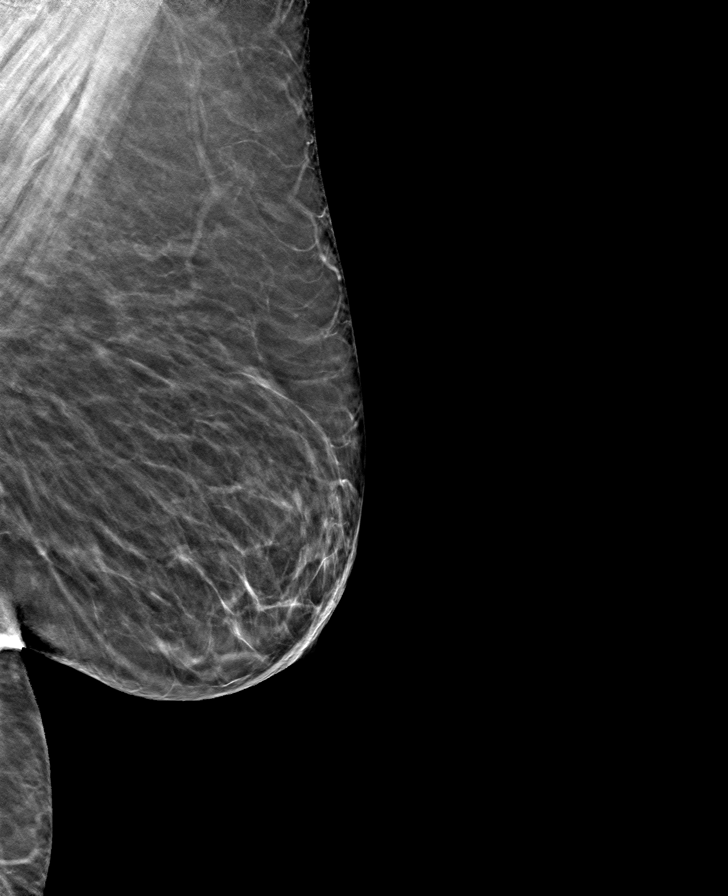

[R MLO tomo · tomo slice 33/65.0]
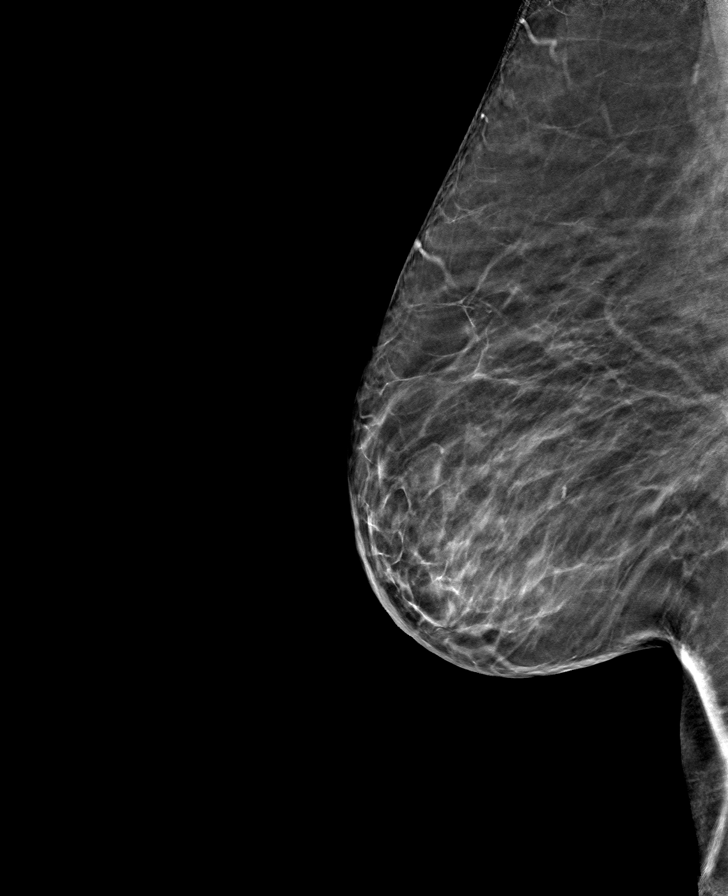

[L CC tomo · tomo slice 29/58.0]
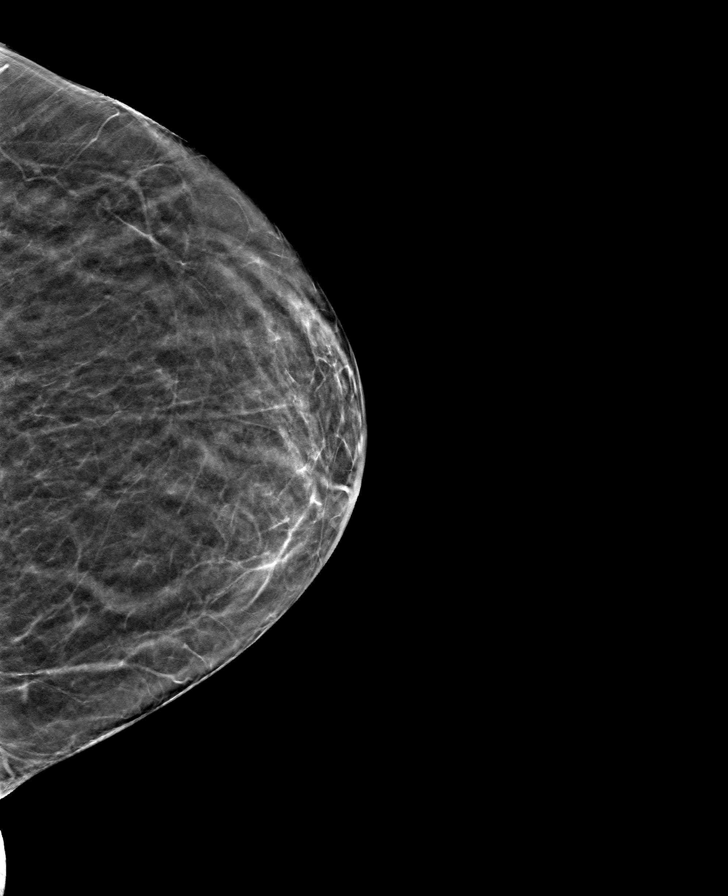

[R CC tomo · tomo slice 31/62.0]
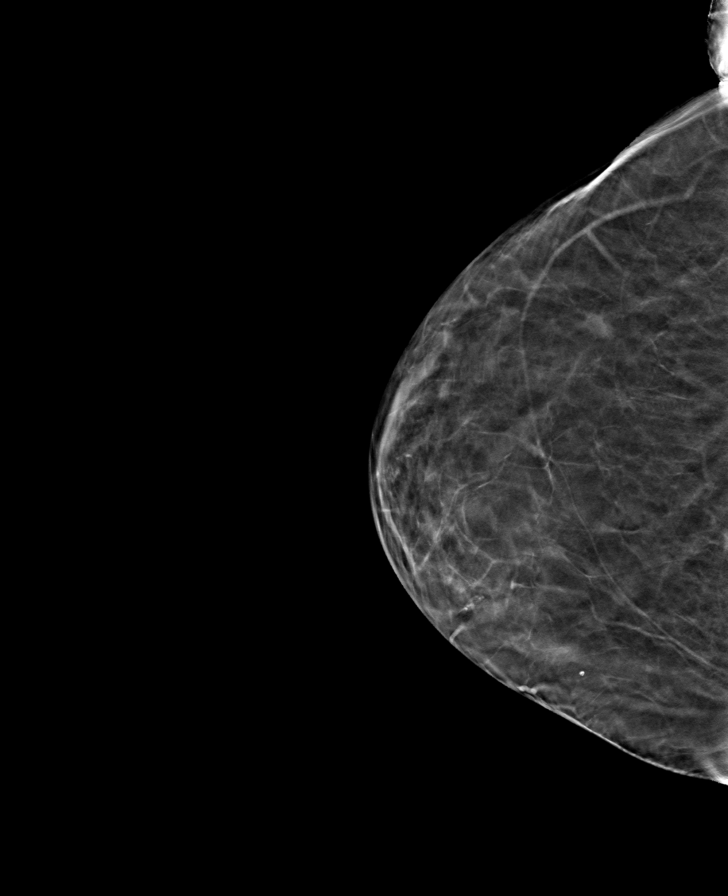

[8 of 24 positions shown; findings below may reference images not displayed]

ACR Breast Density Category b: There are scattered areas of
fibroglandular density.
FINDINGS: There are no findings suspicious for malignancy.
IMPRESSION: No mammographic evidence of malignancy. A result letter of this
screening mammogram will be mailed directly to the patient.

RECOMMENDATION:
Screening mammogram in one year. (Code:51-O-LD2)

BI-RADS CATEGORY  1: Negative.

## 2023-06-05 ENCOUNTER — Other Ambulatory Visit: Payer: Self-pay | Admitting: Nurse Practitioner

## 2023-06-18 ENCOUNTER — Ambulatory Visit: Payer: 59 | Admitting: Nurse Practitioner

## 2023-06-23 ENCOUNTER — Emergency Department: Payer: 59

## 2023-06-23 ENCOUNTER — Inpatient Hospital Stay
Admission: EM | Admit: 2023-06-23 | Discharge: 2023-06-26 | DRG: 871 | Disposition: A | Discharge status: Home / Self Care | Payer: 59 | Attending: Obstetrics and Gynecology | Admitting: Obstetrics and Gynecology

## 2023-06-23 ENCOUNTER — Ambulatory Visit: Payer: 59

## 2023-06-23 DIAGNOSIS — R6510 Systemic inflammatory response syndrome (SIRS) of non-infectious origin without acute organ dysfunction: Secondary | ICD-10-CM

## 2023-06-23 DIAGNOSIS — Z1152 Encounter for screening for COVID-19: Secondary | ICD-10-CM

## 2023-06-23 DIAGNOSIS — G2581 Restless legs syndrome: Secondary | ICD-10-CM | POA: Diagnosis present

## 2023-06-23 DIAGNOSIS — G40909 Epilepsy, unspecified, not intractable, without status epilepticus: Secondary | ICD-10-CM | POA: Diagnosis present

## 2023-06-23 DIAGNOSIS — R569 Unspecified convulsions: Secondary | ICD-10-CM

## 2023-06-23 DIAGNOSIS — G40919 Epilepsy, unspecified, intractable, without status epilepticus: Secondary | ICD-10-CM

## 2023-06-23 DIAGNOSIS — J9601 Acute respiratory failure with hypoxia: Principal | ICD-10-CM | POA: Diagnosis present

## 2023-06-23 DIAGNOSIS — B953 Streptococcus pneumoniae as the cause of diseases classified elsewhere: Secondary | ICD-10-CM | POA: Diagnosis present

## 2023-06-23 DIAGNOSIS — Z8673 Personal history of transient ischemic attack (TIA), and cerebral infarction without residual deficits: Secondary | ICD-10-CM

## 2023-06-23 DIAGNOSIS — E1165 Type 2 diabetes mellitus with hyperglycemia: Secondary | ICD-10-CM | POA: Diagnosis not present

## 2023-06-23 DIAGNOSIS — E876 Hypokalemia: Secondary | ICD-10-CM | POA: Diagnosis present

## 2023-06-23 DIAGNOSIS — K7581 Nonalcoholic steatohepatitis (NASH): Secondary | ICD-10-CM | POA: Diagnosis present

## 2023-06-23 DIAGNOSIS — I5033 Acute on chronic diastolic (congestive) heart failure: Secondary | ICD-10-CM | POA: Diagnosis present

## 2023-06-23 DIAGNOSIS — E1142 Type 2 diabetes mellitus with diabetic polyneuropathy: Secondary | ICD-10-CM | POA: Diagnosis not present

## 2023-06-23 DIAGNOSIS — F17210 Nicotine dependence, cigarettes, uncomplicated: Secondary | ICD-10-CM | POA: Diagnosis present

## 2023-06-23 DIAGNOSIS — Z471 Aftercare following joint replacement surgery: Secondary | ICD-10-CM | POA: Diagnosis not present

## 2023-06-23 DIAGNOSIS — J811 Chronic pulmonary edema: Secondary | ICD-10-CM | POA: Diagnosis not present

## 2023-06-23 DIAGNOSIS — J9621 Acute and chronic respiratory failure with hypoxia: Secondary | ICD-10-CM | POA: Diagnosis not present

## 2023-06-23 DIAGNOSIS — Z8249 Family history of ischemic heart disease and other diseases of the circulatory system: Secondary | ICD-10-CM

## 2023-06-23 DIAGNOSIS — I110 Hypertensive heart disease with heart failure: Secondary | ICD-10-CM | POA: Diagnosis present

## 2023-06-23 DIAGNOSIS — Z7984 Long term (current) use of oral hypoglycemic drugs: Secondary | ICD-10-CM

## 2023-06-23 DIAGNOSIS — J449 Chronic obstructive pulmonary disease, unspecified: Secondary | ICD-10-CM | POA: Diagnosis not present

## 2023-06-23 DIAGNOSIS — Z83438 Family history of other disorder of lipoprotein metabolism and other lipidemia: Secondary | ICD-10-CM

## 2023-06-23 DIAGNOSIS — J9811 Atelectasis: Secondary | ICD-10-CM | POA: Diagnosis not present

## 2023-06-23 DIAGNOSIS — F32A Depression, unspecified: Secondary | ICD-10-CM | POA: Diagnosis present

## 2023-06-23 DIAGNOSIS — R0603 Acute respiratory distress: Secondary | ICD-10-CM | POA: Diagnosis not present

## 2023-06-23 DIAGNOSIS — E119 Type 2 diabetes mellitus without complications: Secondary | ICD-10-CM | POA: Diagnosis not present

## 2023-06-23 DIAGNOSIS — H9190 Unspecified hearing loss, unspecified ear: Secondary | ICD-10-CM

## 2023-06-23 DIAGNOSIS — E669 Obesity, unspecified: Secondary | ICD-10-CM | POA: Diagnosis present

## 2023-06-23 DIAGNOSIS — J9602 Acute respiratory failure with hypercapnia: Secondary | ICD-10-CM | POA: Diagnosis not present

## 2023-06-23 DIAGNOSIS — K7460 Unspecified cirrhosis of liver: Secondary | ICD-10-CM

## 2023-06-23 DIAGNOSIS — R7881 Bacteremia: Secondary | ICD-10-CM | POA: Diagnosis present

## 2023-06-23 DIAGNOSIS — Z6829 Body mass index (BMI) 29.0-29.9, adult: Secondary | ICD-10-CM

## 2023-06-23 DIAGNOSIS — I350 Nonrheumatic aortic (valve) stenosis: Secondary | ICD-10-CM | POA: Diagnosis present

## 2023-06-23 DIAGNOSIS — Z96611 Presence of right artificial shoulder joint: Secondary | ICD-10-CM | POA: Diagnosis present

## 2023-06-23 DIAGNOSIS — I509 Heart failure, unspecified: Secondary | ICD-10-CM | POA: Diagnosis not present

## 2023-06-23 DIAGNOSIS — R062 Wheezing: Secondary | ICD-10-CM | POA: Diagnosis not present

## 2023-06-23 DIAGNOSIS — J441 Chronic obstructive pulmonary disease with (acute) exacerbation: Secondary | ICD-10-CM | POA: Diagnosis present

## 2023-06-23 DIAGNOSIS — J9622 Acute and chronic respiratory failure with hypercapnia: Secondary | ICD-10-CM | POA: Diagnosis present

## 2023-06-23 DIAGNOSIS — Z7902 Long term (current) use of antithrombotics/antiplatelets: Secondary | ICD-10-CM

## 2023-06-23 DIAGNOSIS — Z880 Allergy status to penicillin: Secondary | ICD-10-CM

## 2023-06-23 DIAGNOSIS — I10 Essential (primary) hypertension: Secondary | ICD-10-CM | POA: Diagnosis not present

## 2023-06-23 DIAGNOSIS — A403 Sepsis due to Streptococcus pneumoniae: Secondary | ICD-10-CM | POA: Diagnosis not present

## 2023-06-23 DIAGNOSIS — R0600 Dyspnea, unspecified: Secondary | ICD-10-CM | POA: Diagnosis not present

## 2023-06-23 DIAGNOSIS — Z9842 Cataract extraction status, left eye: Secondary | ICD-10-CM

## 2023-06-23 DIAGNOSIS — M069 Rheumatoid arthritis, unspecified: Secondary | ICD-10-CM | POA: Diagnosis present

## 2023-06-23 DIAGNOSIS — Z881 Allergy status to other antibiotic agents status: Secondary | ICD-10-CM

## 2023-06-23 DIAGNOSIS — Z66 Do not resuscitate: Secondary | ICD-10-CM | POA: Diagnosis present

## 2023-06-23 DIAGNOSIS — R069 Unspecified abnormalities of breathing: Secondary | ICD-10-CM | POA: Diagnosis not present

## 2023-06-23 DIAGNOSIS — Z9841 Cataract extraction status, right eye: Secondary | ICD-10-CM

## 2023-06-23 DIAGNOSIS — R0602 Shortness of breath: Secondary | ICD-10-CM | POA: Diagnosis not present

## 2023-06-23 DIAGNOSIS — R0689 Other abnormalities of breathing: Secondary | ICD-10-CM | POA: Diagnosis not present

## 2023-06-23 DIAGNOSIS — Z825 Family history of asthma and other chronic lower respiratory diseases: Secondary | ICD-10-CM

## 2023-06-23 DIAGNOSIS — K219 Gastro-esophageal reflux disease without esophagitis: Secondary | ICD-10-CM | POA: Diagnosis present

## 2023-06-23 DIAGNOSIS — R0902 Hypoxemia: Secondary | ICD-10-CM | POA: Diagnosis not present

## 2023-06-23 DIAGNOSIS — Z8261 Family history of arthritis: Secondary | ICD-10-CM

## 2023-06-23 DIAGNOSIS — Z961 Presence of intraocular lens: Secondary | ICD-10-CM | POA: Diagnosis present

## 2023-06-23 DIAGNOSIS — Z885 Allergy status to narcotic agent status: Secondary | ICD-10-CM

## 2023-06-23 DIAGNOSIS — Z9981 Dependence on supplemental oxygen: Secondary | ICD-10-CM

## 2023-06-23 DIAGNOSIS — R509 Fever, unspecified: Secondary | ICD-10-CM | POA: Diagnosis not present

## 2023-06-23 DIAGNOSIS — Z882 Allergy status to sulfonamides status: Secondary | ICD-10-CM

## 2023-06-23 DIAGNOSIS — Z79899 Other long term (current) drug therapy: Secondary | ICD-10-CM

## 2023-06-23 DIAGNOSIS — Z833 Family history of diabetes mellitus: Secondary | ICD-10-CM

## 2023-06-23 DIAGNOSIS — Z886 Allergy status to analgesic agent status: Secondary | ICD-10-CM

## 2023-06-23 DIAGNOSIS — G4733 Obstructive sleep apnea (adult) (pediatric): Secondary | ICD-10-CM | POA: Diagnosis present

## 2023-06-23 DIAGNOSIS — Z888 Allergy status to other drugs, medicaments and biological substances status: Secondary | ICD-10-CM

## 2023-06-23 DIAGNOSIS — J45909 Unspecified asthma, uncomplicated: Secondary | ICD-10-CM | POA: Diagnosis present

## 2023-06-23 DIAGNOSIS — G8929 Other chronic pain: Secondary | ICD-10-CM | POA: Diagnosis present

## 2023-06-23 LAB — LACTIC ACID, PLASMA
Lactic Acid, Venous: 2.5 mmol/L (ref 0.5–1.9)
Lactic Acid, Venous: 1.5 mmol/L (ref 0.5–1.9)

## 2023-06-23 LAB — RESPIRATORY PANEL BY PCR

## 2023-06-23 LAB — BLOOD CULTURE ID PANEL (REFLEXED) - BCID2

## 2023-06-23 LAB — COMPREHENSIVE METABOLIC PANEL
ALT: 14 U/L (ref 0–44)
AST: 26 U/L (ref 15–41)
Albumin: 3.7 g/dL (ref 3.5–5.0)
Alkaline Phosphatase: 89 U/L (ref 38–126)
Anion gap: 14 (ref 5–15)
BUN: 13 mg/dL (ref 8–23)
CO2: 26 mmol/L (ref 22–32)
Calcium: 8.9 mg/dL (ref 8.9–10.3)
Chloride: 100 mmol/L (ref 98–111)
Creatinine, Ser: 0.69 mg/dL (ref 0.44–1.00)
GFR, Estimated: >60 mL/min (ref >60)
Glucose, Bld: 211 mg/dL — ABNORMAL HIGH (ref 70–99)
Potassium: 3.1 mmol/L — ABNORMAL LOW (ref 3.5–5.1)
Sodium: 140 mmol/L (ref 135–145)
Total Bilirubin: 0.6 mg/dL (ref <1.2)
Total Protein: 6.9 g/dL (ref 6.5–8.1)

## 2023-06-23 LAB — BRAIN NATRIURETIC PEPTIDE: B Natriuretic Peptide: 79.5 pg/mL (ref 0.0–100.0)

## 2023-06-23 LAB — PROCALCITONIN: Procalcitonin: <0.1 ng/mL

## 2023-06-23 LAB — CBC WITH DIFFERENTIAL/PLATELET
Abs Immature Granulocytes: 0.06 10*3/uL (ref 0.00–0.07)
Basophils Absolute: 0.1 10*3/uL (ref 0.0–0.1)
Basophils Relative: 0 %
Eosinophils Absolute: 0.1 10*3/uL (ref 0.0–0.5)
Eosinophils Relative: 1 %
HCT: 43.9 % (ref 36.0–46.0)
Hemoglobin: 14.4 g/dL (ref 12.0–15.0)
Immature Granulocytes: 1 %
Lymphocytes Relative: 17 %
Lymphs Abs: 2.3 10*3/uL (ref 0.7–4.0)
MCH: 28.1 pg (ref 26.0–34.0)
MCHC: 32.8 g/dL (ref 30.0–36.0)
MCV: 85.6 fL (ref 80.0–100.0)
Monocytes Absolute: 0.8 10*3/uL (ref 0.1–1.0)
Monocytes Relative: 6 %
Neutro Abs: 10 10*3/uL — ABNORMAL HIGH (ref 1.7–7.7)
Neutrophils Relative %: 75 %
Platelets: 248 10*3/uL (ref 150–400)
RBC: 5.13 MIL/uL — ABNORMAL HIGH (ref 3.87–5.11)
RDW: 13.5 % (ref 11.5–15.5)
WBC: 13.2 10*3/uL — ABNORMAL HIGH (ref 4.0–10.5)
nRBC: 0 % (ref 0.0–0.2)

## 2023-06-23 LAB — BLOOD GAS, ARTERIAL
Acid-Base Excess: 1.5 mmol/L (ref 0.0–2.0)
Bicarbonate: 26.6 mmol/L (ref 20.0–28.0)
Delivery systems: POSITIVE
Expiratory PAP: 5 cm[H2O]
FIO2: 0.4 %
Inspiratory PAP: 10 cm[H2O]
O2 Saturation: 88.3 %
Patient temperature: 37
pCO2 arterial: 43 mm[Hg] (ref 32–48)
pH, Arterial: 7.4 (ref 7.35–7.45)
pO2, Arterial: 53 mm[Hg] — ABNORMAL LOW (ref 83–108)

## 2023-06-23 LAB — TROPONIN I (HIGH SENSITIVITY)
Troponin I (High Sensitivity): 16 ng/L (ref <18)
Troponin I (High Sensitivity): 56 ng/L — ABNORMAL HIGH (ref <18)

## 2023-06-23 LAB — CK: Total CK: 50 U/L (ref 38–234)

## 2023-06-23 LAB — CBG MONITORING, ED
Glucose-Capillary: 332 mg/dL — ABNORMAL HIGH (ref 70–99)
Glucose-Capillary: 328 mg/dL — ABNORMAL HIGH (ref 70–99)
Glucose-Capillary: 228 mg/dL — ABNORMAL HIGH (ref 70–99)

## 2023-06-23 LAB — RESP PANEL BY RT-PCR (RSV, FLU A&B, COVID)  RVPGX2
Influenza A by PCR: NEGATIVE
Influenza B by PCR: NEGATIVE
Resp Syncytial Virus by PCR: NEGATIVE
SARS Coronavirus 2 by RT PCR: NEGATIVE

## 2023-06-23 LAB — PROTIME-INR
INR: 1 (ref 0.8–1.2)
Prothrombin Time: 13.2 s (ref 11.4–15.2)

## 2023-06-23 LAB — APTT: aPTT: 27 s (ref 24–36)

## 2023-06-23 LAB — MAGNESIUM: Magnesium: 1.3 mg/dL — ABNORMAL LOW (ref 1.7–2.4)

## 2023-06-23 LAB — D-DIMER, QUANTITATIVE: D-Dimer, Quant: 0.83 ug{FEU}/mL — ABNORMAL HIGH (ref 0.00–0.50)

## 2023-06-23 MED ORDER — LORAZEPAM 2 MG/ML IJ SOLN
1.0000 mg | INTRAMUSCULAR | Status: DC | PRN
Start: 2023-06-23 — End: 2023-06-27
  Administered 2023-06-24: 1 mg via INTRAVENOUS
  Filled 2023-06-23 (×2): qty 1

## 2023-06-23 MED ORDER — INSULIN ASPART 100 UNIT/ML IJ SOLN
0.0000 [IU] | Freq: Every day | INTRAMUSCULAR | Status: DC
  Administered 2023-06-23: 2 [IU] via SUBCUTANEOUS
  Administered 2023-06-24: 4 [IU] via SUBCUTANEOUS
  Filled 2023-06-23 (×2): qty 1

## 2023-06-23 MED ORDER — ONDANSETRON HCL 4 MG/2ML IJ SOLN: 4.0000 mg | Freq: Once | INTRAMUSCULAR | Status: AC

## 2023-06-23 MED ORDER — ONDANSETRON HCL 4 MG PO TABS: 4.0000 mg | ORAL_TABLET | Freq: Four times a day (QID) | ORAL | Status: DC | PRN

## 2023-06-23 MED ORDER — METHYLPREDNISOLONE SODIUM SUCC 40 MG IJ SOLR
40.0000 mg | Freq: Two times a day (BID) | INTRAMUSCULAR | Status: DC
  Administered 2023-06-23 – 2023-06-25 (×4): 40 mg via INTRAVENOUS
  Filled 2023-06-23 (×4): qty 1

## 2023-06-23 MED ORDER — ONDANSETRON HCL 4 MG/2ML IJ SOLN: 4.0000 mg | Freq: Four times a day (QID) | INTRAMUSCULAR | Status: DC | PRN

## 2023-06-23 MED ORDER — ACETAMINOPHEN 500 MG PO TABS
1000.0000 mg | ORAL_TABLET | Freq: Once | ORAL | Status: AC
  Administered 2023-06-23: 1000 mg via ORAL
  Filled 2023-06-23: qty 2

## 2023-06-23 MED ORDER — ENOXAPARIN SODIUM 40 MG/0.4ML IJ SOSY
40.0000 mg | PREFILLED_SYRINGE | INTRAMUSCULAR | Status: DC
  Administered 2023-06-23 – 2023-06-25 (×3): 40 mg via SUBCUTANEOUS
  Filled 2023-06-23 (×3): qty 0.4

## 2023-06-23 MED ORDER — SPIRONOLACTONE 25 MG PO TABS
25.0000 mg | ORAL_TABLET | Freq: Every morning | ORAL | Status: DC
Start: 2023-06-23 — End: 2023-06-24
  Administered 2023-06-23 – 2023-06-24 (×2): 25 mg via ORAL
  Filled 2023-06-23 (×2): qty 1

## 2023-06-23 MED ORDER — SODIUM CHLORIDE 0.9 % IV BOLUS
1000.0000 mL | Freq: Once | INTRAVENOUS | Status: AC
  Administered 2023-06-23: 1000 mL via INTRAVENOUS

## 2023-06-23 MED ORDER — ISOSORBIDE MONONITRATE ER 60 MG PO TB24: 30.0000 mg | ORAL_TABLET | Freq: Every day | ORAL | Status: DC

## 2023-06-23 MED ORDER — INSULIN ASPART 100 UNIT/ML IJ SOLN
0.0000 [IU] | Freq: Three times a day (TID) | INTRAMUSCULAR | Status: DC
  Administered 2023-06-23 – 2023-06-24 (×3): 4 [IU] via SUBCUTANEOUS
  Administered 2023-06-24: 2 [IU] via SUBCUTANEOUS
  Administered 2023-06-24: 3 [IU] via SUBCUTANEOUS
  Administered 2023-06-25: 2 [IU] via SUBCUTANEOUS
  Administered 2023-06-25: 6 [IU] via SUBCUTANEOUS
  Filled 2023-06-23 (×7): qty 1

## 2023-06-23 MED ORDER — CITALOPRAM HYDROBROMIDE 20 MG PO TABS
10.0000 mg | ORAL_TABLET | Freq: Every day | ORAL | Status: DC
  Administered 2023-06-23 – 2023-06-26 (×4): 10 mg via ORAL
  Filled 2023-06-23 (×4): qty 1

## 2023-06-23 MED ORDER — LEVETIRACETAM IN NACL 1000 MG/100ML IV SOLN: 1000.0000 mg | Freq: Once | INTRAVENOUS | Status: DC

## 2023-06-23 MED ORDER — LORAZEPAM 2 MG/ML IJ SOLN
1.0000 mg | INTRAMUSCULAR | Status: DC | PRN
  Filled 2023-06-23: qty 1

## 2023-06-23 MED ORDER — DOXYCYCLINE HYCLATE 100 MG PO TABS
100.0000 mg | ORAL_TABLET | Freq: Two times a day (BID) | ORAL | Status: DC
  Administered 2023-06-23 – 2023-06-24 (×2): 100 mg via ORAL
  Filled 2023-06-23 (×2): qty 1

## 2023-06-23 MED ORDER — GABAPENTIN 100 MG PO CAPS
200.0000 mg | ORAL_CAPSULE | Freq: Every day | ORAL | Status: DC
  Administered 2023-06-23 – 2023-06-25 (×3): 200 mg via ORAL
  Filled 2023-06-23 (×3): qty 2

## 2023-06-23 MED ORDER — CLOPIDOGREL BISULFATE 75 MG PO TABS
75.0000 mg | ORAL_TABLET | Freq: Every day | ORAL | Status: DC
  Administered 2023-06-23 – 2023-06-26 (×4): 75 mg via ORAL
  Filled 2023-06-23 (×4): qty 1

## 2023-06-23 MED ORDER — MONTELUKAST SODIUM 10 MG PO TABS
10.0000 mg | ORAL_TABLET | Freq: Every day | ORAL | Status: DC
  Administered 2023-06-23 – 2023-06-25 (×3): 10 mg via ORAL
  Filled 2023-06-23 (×3): qty 1

## 2023-06-23 MED ORDER — FUROSEMIDE 10 MG/ML IJ SOLN
40.0000 mg | Freq: Every day | INTRAMUSCULAR | Status: DC
  Administered 2023-06-24 – 2023-06-26 (×3): 40 mg via INTRAVENOUS
  Filled 2023-06-23 (×3): qty 4

## 2023-06-23 MED ORDER — SODIUM CHLORIDE 0.9 % IV SOLN
2.0000 g | Freq: Once | INTRAVENOUS | Status: AC
  Administered 2023-06-23: 2 g via INTRAVENOUS
  Filled 2023-06-23: qty 20

## 2023-06-23 MED ORDER — ONDANSETRON HCL 4 MG/2ML IJ SOLN
INTRAMUSCULAR | Status: AC
  Administered 2023-06-23: 4 mg via INTRAVENOUS
  Filled 2023-06-23: qty 2

## 2023-06-23 MED ORDER — ROPINIROLE HCL 0.25 MG PO TABS: 0.5000 mg | ORAL_TABLET | Freq: Every day | ORAL | Status: DC

## 2023-06-23 MED ORDER — IPRATROPIUM-ALBUTEROL 0.5-2.5 (3) MG/3ML IN SOLN
6.0000 mL | Freq: Once | RESPIRATORY_TRACT | Status: AC
  Administered 2023-06-23: 6 mL via RESPIRATORY_TRACT
  Filled 2023-06-23: qty 6

## 2023-06-23 MED ORDER — IPRATROPIUM-ALBUTEROL 0.5-2.5 (3) MG/3ML IN SOLN
3.0000 mL | Freq: Four times a day (QID) | RESPIRATORY_TRACT | Status: DC
Start: 2023-06-23 — End: 2023-06-25
  Administered 2023-06-23 – 2023-06-25 (×7): 3 mL via RESPIRATORY_TRACT
  Filled 2023-06-23 (×7): qty 3

## 2023-06-23 MED ORDER — LACOSAMIDE 50 MG PO TABS
150.0000 mg | ORAL_TABLET | Freq: Two times a day (BID) | ORAL | Status: DC
Start: 2023-06-23 — End: 2023-06-25
  Administered 2023-06-23 – 2023-06-25 (×4): 150 mg via ORAL
  Filled 2023-06-23 (×5): qty 3

## 2023-06-23 MED ORDER — POTASSIUM CHLORIDE CRYS ER 20 MEQ PO TBCR
20.0000 meq | EXTENDED_RELEASE_TABLET | Freq: Every day | ORAL | Status: DC
  Administered 2023-06-23 – 2023-06-26 (×4): 20 meq via ORAL
  Filled 2023-06-23 (×4): qty 1

## 2023-06-23 MED ORDER — LORAZEPAM 2 MG/ML IJ SOLN: 1.0000 mg | INTRAMUSCULAR | Status: DC

## 2023-06-23 MED ORDER — FLUTICASONE FUROATE-VILANTEROL 100-25 MCG/ACT IN AEPB
1.0000 | INHALATION_SPRAY | Freq: Every day | RESPIRATORY_TRACT | Status: DC
  Administered 2023-06-25 – 2023-06-26 (×2): 1 via RESPIRATORY_TRACT
  Filled 2023-06-23 (×2): qty 28

## 2023-06-23 MED ORDER — FUROSEMIDE 10 MG/ML IJ SOLN
40.0000 mg | Freq: Two times a day (BID) | INTRAMUSCULAR | Status: DC
  Administered 2023-06-23: 40 mg via INTRAVENOUS
  Filled 2023-06-23: qty 4

## 2023-06-23 MED ORDER — MAGNESIUM SULFATE 2 GM/50ML IV SOLN
2.0000 g | Freq: Once | INTRAVENOUS | Status: AC
  Administered 2023-06-23: 2 g via INTRAVENOUS
  Filled 2023-06-23: qty 50

## 2023-06-23 MED ORDER — METHYLPREDNISOLONE SODIUM SUCC 125 MG IJ SOLR
125.0000 mg | Freq: Once | INTRAMUSCULAR | Status: AC
  Administered 2023-06-23: 125 mg via INTRAVENOUS
  Filled 2023-06-23: qty 2

## 2023-06-23 MED ORDER — LOSARTAN POTASSIUM 50 MG PO TABS
100.0000 mg | ORAL_TABLET | Freq: Every day | ORAL | Status: DC
  Administered 2023-06-23 – 2023-06-24 (×2): 100 mg via ORAL
  Filled 2023-06-23 (×2): qty 2

## 2023-06-23 MED ORDER — PANTOPRAZOLE SODIUM 40 MG PO TBEC
40.0000 mg | DELAYED_RELEASE_TABLET | Freq: Every day | ORAL | Status: DC
Start: 2023-06-23 — End: 2023-06-27
  Administered 2023-06-24 – 2023-06-26 (×3): 40 mg via ORAL
  Filled 2023-06-23 (×3): qty 1

## 2023-06-23 MED ORDER — METOPROLOL SUCCINATE ER 25 MG PO TB24
25.0000 mg | ORAL_TABLET | Freq: Every day | ORAL | Status: DC
Start: 2023-06-23 — End: 2023-06-27
  Administered 2023-06-23 – 2023-06-26 (×4): 25 mg via ORAL
  Filled 2023-06-23 (×4): qty 1

## 2023-06-23 MED ORDER — AZITHROMYCIN 500 MG IV SOLR
500.0000 mg | Freq: Once | INTRAVENOUS | Status: AC
  Administered 2023-06-23: 500 mg via INTRAVENOUS
  Filled 2023-06-23: qty 5

## 2023-06-23 MED ORDER — FLUTICASONE PROPIONATE 50 MCG/ACT NA SUSP
1.0000 | Freq: Every day | NASAL | Status: DC
  Filled 2023-06-23: qty 16

## 2023-06-23 MED ORDER — CEFTRIAXONE SODIUM 2 G IJ SOLR
2.0000 g | INTRAMUSCULAR | Status: DC
  Administered 2023-06-24 – 2023-06-26 (×3): 2 g via INTRAVENOUS
  Filled 2023-06-23 (×4): qty 20

## 2023-06-23 MED ORDER — ROSUVASTATIN CALCIUM 10 MG PO TABS
10.0000 mg | ORAL_TABLET | Freq: Every day | ORAL | Status: DC
  Administered 2023-06-23 – 2023-06-25 (×3): 10 mg via ORAL
  Filled 2023-06-23 (×4): qty 1

## 2023-06-23 NOTE — Assessment & Plan Note (Signed)
Replaced 

## 2023-06-23 NOTE — Assessment & Plan Note (Signed)
Patient with tachypnea requiring BiPAP on presentation.  Will get an ABG.  Patient feels better than when she came in.  Continue BiPAP for now and hopefully will taper back to her usual oxygen later.

## 2023-06-23 NOTE — Assessment & Plan Note (Addendum)
Continue to monitor.  Check ammonia level.

## 2023-06-23 NOTE — Progress Notes (Signed)
PHARMACY - PHYSICIAN COMMUNICATION CRITICAL VALUE ALERT - BLOOD CULTURE IDENTIFICATION (BCID)  [name] is an 72 y.o. female who presented to [facility] on 06/23/2023 with a chief complaint of respiratory distress  Assessment: 1/4 bottles GPC. BCID detected Streptococcus pneumoniae. Suspect pulmonary source.  Name of physician (or Provider) Contacted: Larkin Ina  Current antibiotics: Ceftriaxone & doxycycline  Changes to prescribed antibiotics recommended:  Patient is on recommended antibiotics - No changes needed Could consider d/c doxycycline  Results for orders placed or performed during the hospital encounter of 06/23/23  Blood Culture ID Panel (Reflexed) (Collected: 06/23/2023  4:25 AM)  Result Value Ref Range   Enterococcus faecalis NOT DETECTED NOT DETECTED   Enterococcus Faecium NOT DETECTED NOT DETECTED   Listeria monocytogenes NOT DETECTED NOT DETECTED   Staphylococcus species NOT DETECTED NOT DETECTED   Staphylococcus aureus (BCID) NOT DETECTED NOT DETECTED   Staphylococcus epidermidis NOT DETECTED NOT DETECTED   Staphylococcus lugdunensis NOT DETECTED NOT DETECTED   Streptococcus species DETECTED (A) NOT DETECTED   Streptococcus agalactiae NOT DETECTED NOT DETECTED   Streptococcus pneumoniae DETECTED (A) NOT DETECTED   Streptococcus pyogenes NOT DETECTED NOT DETECTED   A.calcoaceticus-baumannii NOT DETECTED NOT DETECTED   Bacteroides fragilis NOT DETECTED NOT DETECTED   Enterobacterales NOT DETECTED NOT DETECTED   Enterobacter cloacae complex NOT DETECTED NOT DETECTED   Escherichia coli NOT DETECTED NOT DETECTED   Klebsiella aerogenes NOT DETECTED NOT DETECTED   Klebsiella oxytoca NOT DETECTED NOT DETECTED   Klebsiella pneumoniae NOT DETECTED NOT DETECTED   Proteus species NOT DETECTED NOT DETECTED   Salmonella species NOT DETECTED NOT DETECTED   Serratia marcescens NOT DETECTED NOT DETECTED   Haemophilus influenzae NOT DETECTED NOT DETECTED   Neisseria  meningitidis NOT DETECTED NOT DETECTED   Pseudomonas aeruginosa NOT DETECTED NOT DETECTED   Stenotrophomonas maltophilia NOT DETECTED NOT DETECTED   Candida albicans NOT DETECTED NOT DETECTED   Candida auris NOT DETECTED NOT DETECTED   Candida glabrata NOT DETECTED NOT DETECTED   Candida krusei NOT DETECTED NOT DETECTED   Candida parapsilosis NOT DETECTED NOT DETECTED   Candida tropicalis NOT DETECTED NOT DETECTED   Cryptococcus neoformans/gattii NOT DETECTED NOT DETECTED    [name] 06/23/2023  9:41 PM

## 2023-06-23 NOTE — Assessment & Plan Note (Signed)
Nursing staff noted to seizure 15 seconds.  When I saw her she was talking with me and alert and awake.  She is on Vimpat at home.  Will give a dose of IV Keppra and have neurology evaluate.

## 2023-06-23 NOTE — Assessment & Plan Note (Signed)
Patient with tachypnea and fever on presentation.  I do not have a source of infection currently.  Continue Rocephin and doxycycline.

## 2023-06-23 NOTE — Assessment & Plan Note (Addendum)
BNP actually low but I will give a dose of IV Lasix and see how she responds.  Patient on Toprol and spironolactone.  Last EF 55%

## 2023-06-23 NOTE — Assessment & Plan Note (Signed)
Will obtain an ABG.  Continue BiPAP for now.

## 2023-06-23 NOTE — ED Notes (Signed)
This RN signed for Vimpat from [place] [employer]). Medication not due until tomorrow at 1000. This RN returned vimpat to [place] [employer] [facility]) at this time.

## 2023-06-23 NOTE — Consult Note (Signed)
NEUROLOGY CONSULT NOTE   Date of service: June 23, 2023 Patient Name: [name] MRN:  [id number] DOB:  31-Dec-1950 Chief Complaint: "difficulty breathing" Requesting Provider: Alford Highland, MD  History of Present Illness  [name] is a 72 y.o. woman with deafness requiring ALS interpreter (can do some lipreading; today she declines interpreter and asks daughter at bedside to interpret comfortable with), known seizures (on Vimpat --off of Keppra for at least 1 year) as well as documented pseudoseizures, diabetes, hypertension congestive heart failure, cirrhosis secondary to NASH, COPD, obstructive sleep apnea not tolerant of CPAP (2 L oxygen via nasal cannula at night) depression, restless leg syndrome (on gabapentin 200 mg nightly and ropinirole 0.5 mg nightly), memory loss (SLUMS documented on 02/24/2023 as 20/30 on outpatient neurology visit), vitamin B12 deficiency.  She has been frequently seen by our inpatient service for breakthrough seizures as well as nonepileptic seizures.  Triggers frequently include hypokalemia as well as missed medication doses and infections (UTIs, COPD exacerbations)  She presented today after awakening with shortness of breath waking her from her sleep and was noted to be initially hypoxic to 83% on room air with EMS administering DuoNebs.  She subsequently reported several days of worsening shortness of breath, productive cough and increased sputum, she was also noted to be febrile and was admitted for suspected COPD exacerbation with SIRS.  Subsequently nursing staff noticed "a seizure lasting about 15 seconds and was a little bit altered." When I came in the room she was talking to me coherently and did not recall that she had a seizure   She has a varied spell types. Of note, there were eye fluttering and staring off spells that were documented to be nonepileptic on EEG monitoring in September She did have a spell of facial twitching and  fidgeting/moaning which did correlate with an electrographic seizure on 9/27 Daughter at bedside is aware of her diagnosis of pseudoseizures/"false seizures", but patient did not seem fully aware of this diagnosis  Antiseizure medications in the past included Vimpat 200 mg twice daily --on last outpatient neurology follow-up was only on 100 twice daily; but past doses have also included 150 mg tablets twice a day Keppra 1000 mg twice daily --discontinued due to weakness/somnolence per neurology notes this was sometime in fall 2023, and she has tolerated the discontinuation well without clear concern for seizures    ROS  Comprehensive ROS performed and pertinent positives documented in HPI    Past History   Past Medical History:  Diagnosis Date   Asthma    CHF (congestive heart failure) (HCC)    Cirrhosis, non-alcoholic (HCC)    COPD (chronic obstructive pulmonary disease) (HCC)    Deaf    Depression    Diabetes mellitus without complication (HCC)    GERD (gastroesophageal reflux disease)    Heart murmur    Hepatitis    History of rheumatic fever    History of scarlet fever    Hypertension    IBS (irritable bowel syndrome)    Lymph node disorder    arm   Neuropathy    On home oxygen therapy    hs   Orthopnea    Osteoarthritis    RA (rheumatoid arthritis) (HCC)    RLS (restless legs syndrome)    Seizures (HCC)    Shortness of breath dyspnea    Sleep apnea    Stroke (HCC)    tia    Past Surgical History:  Procedure Laterality Date  ABDOMINAL HYSTERECTOMY     BREAST BIOPSY Right 10/30/2020   Stereo Bx, X-clip,  BENIGN BREAST TISSUE WITH FIBROADENOMATOUS   CATARACT EXTRACTION W/PHACO Right 11/23/2014   Procedure: CATARACT EXTRACTION PHACO AND INTRAOCULAR LENS PLACEMENT (IOC);  Surgeon: Lia Hopping, MD;  Location: [facility];  Service: Ophthalmology;  Laterality: Right;   CATARACT EXTRACTION W/PHACO Left 12/14/2014   Procedure: CATARACT EXTRACTION PHACO AND  INTRAOCULAR LENS PLACEMENT (IOC);  Surgeon: Lia Hopping, MD;  Location: [facility];  Service: Ophthalmology;  Laterality: Left;  US:01:16.6 AP:15.8 CDE:12.14   CESAREAN SECTION     CHOLECYSTECTOMY     COLONOSCOPY WITH PROPOFOL N/A 10/08/2020   Procedure: COLONOSCOPY WITH PROPOFOL;  Surgeon: Toledo, Boykin Nearing, MD;  Location: [facility];  Service: Gastroenterology;  Laterality: N/A;   ESOPHAGOGASTRODUODENOSCOPY (EGD) WITH PROPOFOL N/A 10/08/2020   Procedure: ESOPHAGOGASTRODUODENOSCOPY (EGD) WITH PROPOFOL;  Surgeon: Toledo, Boykin Nearing, MD;  Location: [facility];  Service: Gastroenterology;  Laterality: N/A;  DM DEAF, NEEDS SIGN INTERPRETER PER SON   REVERSE SHOULDER ARTHROPLASTY Right 12/21/2019   Procedure: REVERSE SHOULDER ARTHROPLASTY;  Surgeon: Lyndle Herrlich, MD;  Location: [facility];  Service: Orthopedics;  Laterality: Right;   THUMB ARTHROSCOPY     TONSILLECTOMY     TYMPANOPLASTY     muliple    Family History: Family History  Problem Relation Age of Onset   Lung cancer Mother    CAD Father    Miscarriages / Stillbirths Sister    Hyperlipidemia Sister    Hypertension Sister    Diabetes Sister    COPD Sister    Asthma Sister    Arthritis Sister    Breast cancer Sister    Heart attack Sister    Hyperlipidemia Sister    Heart disease Sister    Heart attack Sister    Diabetes Sister    Cancer Sister    Asthma Sister    Heart disease Brother    Heart attack Brother    Depression Brother    Hypertension Son    Hyperlipidemia Son    Heart disease Son    Depression Son    Hypertension Daughter    Depression Daughter    Asthma Daughter    Birth defects Paternal Uncle    Hearing loss Brother     Social History  reports that she has been smoking cigarettes. She started smoking about 55 years ago. She has a 27.7 pack-year smoking history. She has never used smokeless tobacco. She reports that she does not drink alcohol and does not use drugs.  Allergies   Allergen Reactions   Celebrex [Celecoxib] Itching    itching   Ciprofloxacin Itching   Codeine Itching   Fosphenytoin Itching and Other (See Comments)   Levaquin [Levofloxacin In D5w] Itching   Levofloxacin Itching   Lovastatin Itching   Pravastatin Itching   Sulfa Antibiotics Itching   Aspirin Itching and Rash   Azithromycin Rash   Penicillins Rash    Documentation indicates severe reaction  Pt tolerated cephalosporin without adverse reaction 09/18     Medications   Current Facility-Administered Medications:    [START ON 06/24/2023] cefTRIAXone (ROCEPHIN) 2 g in sodium chloride 0.9 % 100 mL IVPB, 2 g, Intravenous, Q24H, Wieting, Richard, MD   citalopram (CELEXA) tablet 10 mg, 10 mg, Oral, Daily, Wieting, Richard, MD   clopidogrel (PLAVIX) tablet 75 mg, 75 mg, Oral, Daily, Wieting, Richard, MD   doxycycline (VIBRA-TABS) tablet 100 mg, 100 mg, Oral, Q12H, Alford Highland, MD   fluticasone (  FLONASE) 50 MCG/ACT nasal spray 1 spray, 1 spray, Each Nare, BID, Wieting, Richard, MD   fluticasone furoate-vilanterol (BREO ELLIPTA) 100-25 MCG/ACT 1 puff, 1 puff, Inhalation, Daily, Wieting, Richard, MD   furosemide (LASIX) injection 40 mg, 40 mg, Intravenous, BID, Wieting, Richard, MD   gabapentin (NEURONTIN) capsule 200 mg, 200 mg, Oral, QHS, Wieting, Richard, MD   insulin aspart (novoLOG) injection 0-5 Units, 0-5 Units, Subcutaneous, QHS, Wieting, Richard, MD   insulin aspart (novoLOG) injection 0-6 Units, 0-6 Units, Subcutaneous, TID WC, Wieting, Richard, MD   ipratropium-albuterol (DUONEB) 0.5-2.5 (3) MG/3ML nebulizer solution 3 mL, 3 mL, Nebulization, QID, Wieting, Richard, MD   isosorbide mononitrate (IMDUR) 24 hr tablet 30 mg, 30 mg, Oral, Daily, Wieting, Richard, MD   Lacosamide TABS 150 mg, 150 mg, Oral, Q12H, Wieting, Richard, MD   LORazepam (ATIVAN) injection 1 mg, 1 mg, Intravenous, Q2H PRN, Renae Gloss, Richard, MD   losartan (COZAAR) tablet 100 mg, 100 mg, Oral, Daily, Wieting,  Richard, MD   methylPREDNISolone sodium succinate (SOLU-MEDROL) 40 mg/mL injection 40 mg, 40 mg, Intravenous, Q12H, Wieting, Richard, MD   metoprolol succinate (TOPROL-XL) 24 hr tablet 25 mg, 25 mg, Oral, Daily, Wieting, Richard, MD   montelukast (SINGULAIR) tablet 10 mg, 10 mg, Oral, QHS, Wieting, Richard, MD   pantoprazole (PROTONIX) EC tablet 40 mg, 40 mg, Oral, Daily, Wieting, Richard, MD   potassium chloride (KLOR-CON) CR tablet 20 mEq, 20 mEq, Oral, Daily, Wieting, Richard, MD   rOPINIRole (REQUIP) tablet 0.5 mg, 0.5 mg, Oral, QHS, Wieting, Richard, MD   rosuvastatin (CRESTOR) tablet 10 mg, 10 mg, Oral, QHS, Wieting, Richard, MD   spironolactone (ALDACTONE) tablet 25 mg, 25 mg, Oral, q morning, Wieting, Richard, MD  Current Outpatient Medications:    citalopram (CELEXA) 10 MG tablet, TAKE 1 TABLET BY MOUTH DAILY, Disp: 90 tablet, Rfl: 3   clopidogrel (PLAVIX) 75 MG tablet, TAKE 1 TABLET BY MOUTH DAILY, Disp: 90 tablet, Rfl: 1   fluticasone (FLONASE) 50 MCG/ACT nasal spray, Place 1 spray into both nostrils 2 (two) times daily., Disp: , Rfl:    fluticasone furoate-vilanterol (BREO ELLIPTA) 100-25 MCG/ACT AEPB, Inhale 1 puff into the lungs daily., Disp: , Rfl:    furosemide (LASIX) 40 MG tablet, Take 1 tablet (40 mg total) by mouth daily., Disp: 90 tablet, Rfl: 3   gabapentin (NEURONTIN) 100 MG capsule, Take 2 capsules (200 mg total) by mouth at bedtime., Disp: 180 capsule, Rfl: 3   glimepiride (AMARYL) 2 MG tablet, TAKE 1 TABLET BY MOUTH DAILY FOR DIABETES, Disp: 90 tablet, Rfl: 3   ipratropium-albuterol (DUONEB) 0.5-2.5 (3) MG/3ML SOLN, INHALE 1 VIAL USING NEBULIZER EVERY SIX HOURS AS NEEDED FOR SHORTNESS OF BREATH, Disp: 90 mL, Rfl: 1   isosorbide mononitrate (IMDUR) 30 MG 24 hr tablet, Take 30 mg by mouth daily., Disp: , Rfl:    Lacosamide (VIMPAT) 150 MG TABS, Take 150 mg by mouth every 12 (twelve) hours., Disp: , Rfl:    losartan (COZAAR) 100 MG tablet, TAKE 1 TABLET BY MOUTH DAILY AS  DIRECTED, Disp: 90 tablet, Rfl: 3   metoprolol succinate (TOPROL-XL) 25 MG 24 hr tablet, TAKE 1 TABLET BY MOUTH DAILY, Disp: 90 tablet, Rfl: 3   montelukast (SINGULAIR) 10 MG tablet, TAKE 1 TABLET BY MOUTH AT BEDTIME, Disp: 90 tablet, Rfl: 3   pantoprazole (PROTONIX) 40 MG tablet, TAKE 1 TABLET BY MOUTH DAILY FOR REFLUX/HEARTBURN, Disp: 90 tablet, Rfl: 3   potassium chloride (KLOR-CON) 10 MEQ tablet, TAKE 2 TABLETS BY MOUTH DAILY,  Disp: 180 tablet, Rfl: 3   rOPINIRole (REQUIP) 0.5 MG tablet, Take 1 tablet (0.5 mg total) by mouth at bedtime., Disp: 30 tablet, Rfl: 0   rosuvastatin (CRESTOR) 10 MG tablet, TAKE 1 TABLET BY MOUTH AT BEDTIME, Disp: 90 tablet, Rfl: 3   spironolactone (ALDACTONE) 25 MG tablet, TAKE 1 TABLET BY MOUTH EVERY MORNING, Disp: 90 tablet, Rfl: 0   traMADol (ULTRAM) 50 MG tablet, Take 1 tablet (50 mg total) by mouth every 12 (twelve) hours as needed for moderate pain or severe pain., Disp: 15 tablet, Rfl: 0  Vitals   Vitals:   06/23/23 0530 06/23/23 0600 06/23/23 0627 06/23/23 0630  BP: (!) 159/64 (!) 146/53  (!) 128/56  Pulse: 99 98  93  Resp: (!) 40 (!) 34  (!) 38  Temp:   99.2 F (37.3 C)   TempSrc:   Axillary   SpO2:    90%    There is no height or weight on file to calculate BMI.  Physical Exam   Constitutional: Appears well-developed and well-nourished.  Psych: Affect appropriate to situation.  Eyes: No scleral injection.  HENT: No OP obstruction.  Nasal cannula in place Head: Normocephalic.  Cardiovascular: Normal rate and regular rhythm.  Respiratory: Effort normal, non-labored breathing.  GI: Soft.  No distension. There is no tenderness.  Skin: WDI.   Neurologic Examination   Mental status: Awake, alert, oriented to person/place/month/age/situation.  Limited ability to relate medical history and details of her medications (defers this to her son who manages her medications, who is not available at bedside).  Follows simple commands.  Fluent casual  speech Cranial nerves: Pupils equal round reactive to light, EOMI, face symmetric, tongue midline Motor: Right upper extremity testing limited by rotator cuff pain (chronic).  Slight pronator drift of bilateral upper extremities.  No drift of the bilateral lower extremities  Labs/Imaging/Neurodiagnostic studies    Basic Metabolic Panel: Recent Labs  Lab 06/23/23 0415  NA 140  K 3.1*  CL 100  CO2 26  GLUCOSE 211*  BUN 13  CREATININE 0.69  CALCIUM 8.9    CBC: Recent Labs  Lab 06/23/23 0415  WBC 13.2*  NEUTROABS 10.0*  HGB 14.4  HCT 43.9  MCV 85.6  PLT 248    Lipid Panel:  Lab Results  Component Value Date   CHOL 136 11/19/2022   HDL 52.10 11/19/2022   LDLCALC 60 11/19/2022   TRIG 117.0 11/19/2022   CHOLHDL 3 11/19/2022    HgbA1c:  Lab Results  Component Value Date   HGBA1C 7.1 (H) 03/19/2023   Urine Drug Screen:     Component Value Date/Time   LABOPIA NONE DETECTED 03/31/2022 0753   COCAINSCRNUR NONE DETECTED 03/31/2022 0753   LABBENZ POSITIVE (A) 03/31/2022 0753   AMPHETMU NONE DETECTED 03/31/2022 0753   THCU NONE DETECTED 03/31/2022 0753   LABBARB NONE DETECTED 03/31/2022 0753    Alcohol Level     Component Value Date/Time   ETH <10 09/29/2021 2136   INR  Lab Results  Component Value Date   INR 1.0 06/23/2023   APTT  Lab Results  Component Value Date   APTT 27 06/23/2023   AED levels:  Lab Results  Component Value Date   PHENYTOIN 12.6 05/16/2012     ASSESSMENT   [name] is a 72 y.o. woman with a past medical history of documented seizures as well as pseudoseizures, presenting with an episode of reduced responsiveness in the setting of difficulty breathing.  This  is a common trigger for pseudoseizure for her.  Given records are not well updated with different outpatient provider notes listing different current seizure medication regimen, her current dose should be confirmed with her pharmacy or review of her blister  packs  Please note that untreated sleep apnea can worsen a variety of neurological/cardiovascular conditions including increased risk of seizure due to poor sleep quality.  Additionally do seem to be a trigger for her pseudoseizures as well and the presenting complaint of waking up with difficulty breathing is classic for a sleep apnea issue.   RECOMMENDATIONS  -Continue home Vimpat, dose needs to be confirmed with son when he is available, or by calling her pharmacy/reviewing her blister packs (reportedly arriving at the hospital around noon) -No indication to add Keppra at this time order canceled -Add on magnesium and CK, treat as needed  -Mg resulted at 1.3, 2 g magnesium ordered  -CK resulted at 50 -Ativan order adjusted to be given only for seizure activity lasting greater than 5 minutes, neurology should be notified if this medication is used -EEG canceled at this time as patient is at her baseline and would not change management -Patient encouraged to try to find CPAP that she can tolerate, will need repeat outpatient sleep study -Appreciate remainder of management of respiratory issues per primary team -Inpatient neurology will sign off at this time, please do not hesitate to reach out if additional questions or concerns arise. Recommendations conveyed to primary team via secure chat.   Signed, Gordy Councilman, MD [facility] coverage for [facility] is from 8 AM to 4 AM in-house and 4 PM to 8 PM by telephone/video. 8 PM to 8 AM emergent questions or overnight urgent questions should be addressed to Teleneurology On-call or [facility] neurohospitalist; contact information can be found on AMION

## 2023-06-23 NOTE — H&P (Signed)
History and Physical    Patient: [name] YQM:[id number] DOB: [birth year] DOA: 06/23/2023 DOS: the patient was seen and examined on 06/23/2023 PCP: [name], NP  Patient coming from: Home  Chief Complaint:  Chief Complaint  Patient presents with   Shortness of Breath   HPI: [name] is a 72 y.o. female with medical history significant of CHF, Elita [place]one cirrhosis, COPD on chronic oxygen, hard of hearing, depression, diabetes, GERD, hypertension, rheumatoid arthritis, seizures.  She presents with shortness of breath chest pain cough wheezing and coughing up whitish sputum and some nausea and vomiting and some diarrhea.  Patient had respiratory distress and a fever in the emergency room.  COVID test pending.  Patient feeling better on BiPAP.  Nursing staff noticed a seizure lasting about 15 seconds and was a little bit altered.  When I came in the room she was talking to me coherently and did not recall that she had a seizure.  Hospitalist services contacted for further evaluation.  Admitting with systemic inflammatory response syndrome, respiratory distress and seizure. Review of Systems: Review of Systems  Constitutional:  Positive for fever. Negative for chills, malaise/fatigue and weight loss.  HENT:  Positive for hearing loss. Negative for congestion.   Eyes:  Negative for blurred vision.  Respiratory:  Positive for cough, shortness of breath and wheezing.   Cardiovascular:  Positive for chest pain.  Gastrointestinal:  Positive for diarrhea, nausea and vomiting. Negative for abdominal pain.  Genitourinary:  Negative for dysuria.  Musculoskeletal:  Negative for myalgias.  Skin:  Negative for rash.  Neurological:  Negative for dizziness.  Psychiatric/Behavioral:  Positive for depression.     Past Medical History:  Diagnosis Date   Asthma    CHF (congestive heart failure) (HCC)    Cirrhosis, non-alcoholic (HCC)    COPD (chronic obstructive pulmonary disease) (HCC)    Deaf     Depression    Diabetes mellitus without complication (HCC)    GERD (gastroesophageal reflux disease)    Heart murmur    Hepatitis    History of rheumatic fever    History of scarlet fever    Hypertension    IBS (irritable bowel syndrome)    Lymph node disorder    arm   Neuropathy    On home oxygen therapy    hs   Orthopnea    Osteoarthritis    RA (rheumatoid arthritis) (HCC)    RLS (restless legs syndrome)    Seizures (HCC)    Shortness of breath dyspnea    Sleep apnea    Stroke (HCC)    tia   Past Surgical History:  Procedure Laterality Date   ABDOMINAL HYSTERECTOMY     BREAST BIOPSY Right 10/30/2020   Stereo Bx, X-clip,  BENIGN BREAST TISSUE WITH FIBROADENOMATOUS   CATARACT EXTRACTION W/PHACO Right 11/23/2014   Procedure: CATARACT EXTRACTION PHACO AND INTRAOCULAR LENS PLACEMENT (IOC);  Surgeon: Lia Hopping, MD;  Location: [facility];  Service: Ophthalmology;  Laterality: Right;   CATARACT EXTRACTION W/PHACO Left 12/14/2014   Procedure: CATARACT EXTRACTION PHACO AND INTRAOCULAR LENS PLACEMENT (IOC);  Surgeon: Lia Hopping, MD;  Location: [facility];  Service: Ophthalmology;  Laterality: Left;  US:01:16.6 AP:15.8 CDE:12.14   CESAREAN SECTION     CHOLECYSTECTOMY     COLONOSCOPY WITH PROPOFOL N/A 10/08/2020   Procedure: COLONOSCOPY WITH PROPOFOL;  Surgeon: Toledo, Boykin Nearing, MD;  Location: [facility];  Service: Gastroenterology;  Laterality: N/A;   ESOPHAGOGASTRODUODENOSCOPY (EGD) WITH PROPOFOL N/A 10/08/2020   Procedure:  ESOPHAGOGASTRODUODENOSCOPY (EGD) WITH PROPOFOL;  Surgeon: Toledo, Boykin Nearing, MD;  Location: [facility];  Service: Gastroenterology;  Laterality: N/A;  DM DEAF, NEEDS SIGN INTERPRETER PER SON   REVERSE SHOULDER ARTHROPLASTY Right 12/21/2019   Procedure: REVERSE SHOULDER ARTHROPLASTY;  Surgeon: Lyndle Herrlich, MD;  Location: [facility];  Service: Orthopedics;  Laterality: Right;   THUMB ARTHROSCOPY     TONSILLECTOMY     TYMPANOPLASTY      muliple   Social History:  reports that she has been smoking cigarettes. She started smoking about 55 years ago. She has a 27.7 pack-year smoking history. She has never used smokeless tobacco. She reports that she does not drink alcohol and does not use drugs.  Allergies  Allergen Reactions   Celebrex [Celecoxib] Itching    itching   Ciprofloxacin Itching   Codeine Itching   Fosphenytoin Itching and Other (See Comments)   Levaquin [Levofloxacin In D5w] Itching   Levofloxacin Itching   Lovastatin Itching   Pravastatin Itching   Sulfa Antibiotics Itching   Aspirin Itching and Rash   Azithromycin Rash   Penicillins Rash    Documentation indicates severe reaction  Pt tolerated cephalosporin without adverse reaction 09/18     Family History  Problem Relation Age of Onset   Lung cancer Mother    CAD Father    Miscarriages / Stillbirths Sister    Hyperlipidemia Sister    Hypertension Sister    Diabetes Sister    COPD Sister    Asthma Sister    Arthritis Sister    Breast cancer Sister    Heart attack Sister    Hyperlipidemia Sister    Heart disease Sister    Heart attack Sister    Diabetes Sister    Cancer Sister    Asthma Sister    Heart disease Brother    Heart attack Brother    Depression Brother    Hypertension Son    Hyperlipidemia Son    Heart disease Son    Depression Son    Hypertension Daughter    Depression Daughter    Asthma Daughter    Birth defects Paternal Uncle    Hearing loss Brother     Prior to Admission medications   Medication Sig Start Date End Date Taking? Authorizing Provider  citalopram (CELEXA) 10 MG tablet TAKE 1 TABLET BY MOUTH DAILY 01/16/23   [name], NP  clopidogrel (PLAVIX) 75 MG tablet TAKE 1 TABLET BY MOUTH DAILY 06/09/23   [name], NP  fluticasone [facility]) 50 MCG/ACT nasal spray Place 1 spray into both nostrils 2 (two) times daily.    [provider]  fluticasone furoate-vilanterol (BREO ELLIPTA) 100-25 MCG/ACT  AEPB Inhale 1 puff into the lungs daily.    [provider]  furosemide (LASIX) 40 MG tablet Take 1 tablet (40 mg total) by mouth daily. 03/05/23   [name], NP  gabapentin (NEURONTIN) 100 MG capsule Take 2 capsules (200 mg total) by mouth at bedtime. 05/11/23   [name], NP  glimepiride (AMARYL) 2 MG tablet TAKE 1 TABLET BY MOUTH DAILY FOR DIABETES 02/19/23   [name], NP  ipratropium-albuterol (DUONEB) 0.5-2.5 (3) MG/3ML SOLN INHALE 1 VIAL USING NEBULIZER EVERY SIX HOURS AS NEEDED FOR SHORTNESS OF BREATH 05/20/23   [name], NP  isosorbide mononitrate (IMDUR) 30 MG 24 hr tablet Take 30 mg by mouth daily. 03/20/23 03/19/24  [provider]  Lacosamide (VIMPAT) 150 MG TABS Take 150 mg by mouth every 12 (twelve) hours.  [provider]  losartan (COZAAR) 100 MG tablet TAKE 1 TABLET BY MOUTH DAILY AS DIRECTED 03/17/23   [name], NP  metoprolol succinate (TOPROL-XL) 25 MG 24 hr tablet TAKE 1 TABLET BY MOUTH DAILY 03/17/23   [name], NP  montelukast (SINGULAIR) 10 MG tablet TAKE 1 TABLET BY MOUTH AT BEDTIME 12/10/22   [name], NP  pantoprazole (PROTONIX) 40 MG tablet TAKE 1 TABLET BY MOUTH DAILY FOR REFLUX/HEARTBURN 12/10/22   [name], NP  potassium chloride (KLOR-CON) 10 MEQ tablet TAKE 2 TABLETS BY MOUTH DAILY 05/11/23   [name], NP  rOPINIRole (REQUIP) 0.5 MG tablet Take 1 tablet (0.5 mg total) by mouth at bedtime. 03/29/23   Enedina Finner, MD  rosuvastatin (CRESTOR) 10 MG tablet TAKE 1 TABLET BY MOUTH AT BEDTIME 03/24/23   [name], NP  spironolactone (ALDACTONE) 25 MG tablet TAKE 1 TABLET BY MOUTH EVERY MORNING 01/29/23   [name], NP  traMADol (ULTRAM) 50 MG tablet Take 1 tablet (50 mg total) by mouth every 12 (twelve) hours as needed for moderate pain or severe pain. 03/29/23   Enedina Finner, MD    Physical Exam: Vitals:   06/23/23 0530 06/23/23 0600 06/23/23 0627 06/23/23 0630  BP: (!) 159/64 (!) 146/53  (!) 128/56  Pulse: 99 98  93   Resp: (!) 40 (!) 34  (!) 38  Temp:   99.2 F (37.3 C)   TempSrc:   Axillary   SpO2:    90%   Physical Exam HENT:     Head: Normocephalic.     Mouth/Throat:     Pharynx: No oropharyngeal exudate.  Eyes:     General: Lids are normal.     Conjunctiva/sclera: Conjunctivae normal.  Cardiovascular:     Rate and Rhythm: Normal rate and regular rhythm.     Heart sounds: S1 normal and S2 normal. Murmur heard.     Systolic murmur is present with a grade of 2/6.  Pulmonary:     Breath sounds: Examination of the right-middle field reveals wheezing. Examination of the left-middle field reveals wheezing. Examination of the right-lower field reveals decreased breath sounds and rhonchi. Examination of the left-lower field reveals decreased breath sounds and rhonchi. Decreased breath sounds, wheezing and rhonchi present.  Abdominal:     Palpations: Abdomen is soft.     Tenderness: There is no abdominal tenderness.  Musculoskeletal:     Right lower leg: No swelling.     Left lower leg: No swelling.  Skin:    General: Skin is warm.     Findings: No rash.  Neurological:     Mental Status: She is alert and oriented to person, place, and time.     Data Reviewed: Potassium 3.1, creatinine 0.69, lactic acid 2.5, procalcitonin negative, BNP 79.5, white blood cell count 13.2, hemoglobin 14.4, platelet count of 248, troponin 16 Chest x-ray negative, EKG interpreted by me sinus rhythm 92 bpm nonspecific ST-T wave changes Assessment and Plan: * Respiratory distress Patient with tachypnea requiring BiPAP on presentation.  Will get an ABG.  Patient feels better than when she came in.  Continue BiPAP for now and hopefully will taper back to her usual oxygen later.  Acute respiratory failure with hypoxia (HCC) Will obtain an ABG.  Continue BiPAP for now.  COPD with acute exacerbation (HCC) Continue Solu-Medrol nebulizers and inhalers.  Patient with bronchospasm.  Systemic inflammatory response  syndrome (HCC) Patient with tachypnea and fever on presentation.  I do not have a  source of infection currently.  Continue Rocephin and doxycycline.  Acute on chronic diastolic CHF (congestive heart failure) (HCC) BNP actually low but I will give a dose of IV Lasix and see how she responds.  Patient on Toprol and spironolactone.  Last EF 55%  Seizure [facility]) Nursing staff noted to seizure 15 seconds.  When I saw her she was talking with me and alert and awake.  She is on Vimpat at home.  Will give a dose of IV Keppra and have neurology evaluate.  Controlled type 2 diabetes mellitus without complication, without long-term current use of insulin (HCC) Sliding scale insulin hold oral medications  Hypokalemia Replace potassium  Liver cirrhosis secondary to NASH (HCC) Continue to monitor.  Check ammonia level.  DNR (do not resuscitate) Patient made a DNR  Hard of hearing Patient able to read lips      Advance Care Planning:   Code Status: Limited: Do not attempt resuscitation (DNR) -DNR-LIMITED -Do Not Intubate/DNI    Consults: Pulmonary and neurology  Family Communication: Spoke with son on the phone  Severity of Illness: The appropriate patient status for this patient is INPATIENT. Inpatient status is judged to be reasonable and necessary in order to provide the required intensity of service to ensure the patient's safety. The patient's presenting symptoms, physical exam findings, and initial radiographic and laboratory data in the context of their chronic comorbidities is felt to place them at high risk for further clinical deterioration. Furthermore, it is not anticipated that the patient will be medically stable for discharge from the hospital within 2 midnights of admission.   * I certify that at the point of admission it is my clinical judgment that the patient will require inpatient hospital care spanning beyond 2 midnights from the point of admission due to high intensity of  service, high risk for further deterioration and high frequency of surveillance required.*  Patient is critically ill.  Patient made a DNR.  Will order Keppra IV for seizure.  Continue steroids and antibiotics for systemic terrier inflammatory response syndrome and COPD exacerbation.  Continue BiPAP for respiratory distress.  Time spent on admission 55 minutes  Author: Alford Highland, MD 06/23/2023 8:15 AM  For on call review [email or web address].

## 2023-06-23 NOTE — ED Notes (Signed)
Pt placed on bedpan at this time.

## 2023-06-23 NOTE — ED Triage Notes (Signed)
Pt come via [facility] for SOB that suddenly woke her from her sleep with some CP, pt wears 2L all the time, initial stats was 83% on RA with EMS. Pt given one duoneb PTA. Pt is deaf.

## 2023-06-23 NOTE — Assessment & Plan Note (Signed)
Continue Solu-Medrol nebulizers and inhalers.  Patient with bronchospasm.

## 2023-06-23 NOTE — Consult Note (Signed)
Pulmonary Medicine          Date: 06/23/2023,   MRN# [id number] [name] 05-Jun-1951     AdmissionWeight: 74.8 kg                 CurrentWeight: 74.8 kg    CHIEF COMPLAINT:   Respiratory distress   HISTORY OF PRESENT ILLNESS   This is a 72 year old lady, well known to my service, DNR, severe copd/asthma, on oxygen at 2 liters, deaf, chf, moderate AS,  cirrhosis, who came to the ER, complaining with increase shortness of breath over several days and difficulty getting her breath.  Vague chest pain, NO PLEURISY calf pain, fever, tachy. She was promptly placed on bipap. Improved. At noon when I saw her she was off bipap. On five liters oxygen. Speaking in full sentences. Family in the room. Came back at five, voices that her breathing has improved. Still on five liters. Sats 90-92 %.   There was a brief bout of what look like a seizure. Presently mentally alert. No siff neck or sinus pressure nor unusual headache  PAST MEDICAL HISTORY   Past Medical History:  Diagnosis Date   Asthma    CHF (congestive heart failure) (HCC)    Cirrhosis, non-alcoholic (HCC)    COPD (chronic obstructive pulmonary disease) (HCC)    Deaf    Depression    Diabetes mellitus without complication (HCC)    GERD (gastroesophageal reflux disease)    Heart murmur    Hepatitis    History of rheumatic fever    History of scarlet fever    Hypertension    IBS (irritable bowel syndrome)    Lymph node disorder    arm   Neuropathy    On home oxygen therapy    hs   Orthopnea    Osteoarthritis    RA (rheumatoid arthritis) (HCC)    RLS (restless legs syndrome)    Seizures (HCC)    Shortness of breath dyspnea    Sleep apnea    Stroke (HCC)    tia     SURGICAL HISTORY   Past Surgical History:  Procedure Laterality Date   ABDOMINAL HYSTERECTOMY     BREAST BIOPSY Right 10/30/2020   Stereo Bx, X-clip,  BENIGN BREAST TISSUE WITH FIBROADENOMATOUS   CATARACT EXTRACTION W/PHACO Right  11/23/2014   Procedure: CATARACT EXTRACTION PHACO AND INTRAOCULAR LENS PLACEMENT (IOC);  Surgeon: Lia Hopping, MD;  Location: [facility];  Service: Ophthalmology;  Laterality: Right;   CATARACT EXTRACTION W/PHACO Left 12/14/2014   Procedure: CATARACT EXTRACTION PHACO AND INTRAOCULAR LENS PLACEMENT (IOC);  Surgeon: Lia Hopping, MD;  Location: [facility];  Service: Ophthalmology;  Laterality: Left;  US:01:16.6 AP:15.8 CDE:12.14   CESAREAN SECTION     CHOLECYSTECTOMY     COLONOSCOPY WITH PROPOFOL N/A 10/08/2020   Procedure: COLONOSCOPY WITH PROPOFOL;  Surgeon: Toledo, Boykin Nearing, MD;  Location: [facility];  Service: Gastroenterology;  Laterality: N/A;   ESOPHAGOGASTRODUODENOSCOPY (EGD) WITH PROPOFOL N/A 10/08/2020   Procedure: ESOPHAGOGASTRODUODENOSCOPY (EGD) WITH PROPOFOL;  Surgeon: Toledo, Boykin Nearing, MD;  Location: [facility];  Service: Gastroenterology;  Laterality: N/A;  DM DEAF, NEEDS SIGN INTERPRETER PER SON   REVERSE SHOULDER ARTHROPLASTY Right 12/21/2019   Procedure: REVERSE SHOULDER ARTHROPLASTY;  Surgeon: Lyndle Herrlich, MD;  Location: [facility];  Service: Orthopedics;  Laterality: Right;   THUMB ARTHROSCOPY     TONSILLECTOMY     TYMPANOPLASTY     muliple     FAMILY  HISTORY   Family History  Problem Relation Age of Onset   Lung cancer Mother    CAD Father    Miscarriages / Stillbirths Sister    Hyperlipidemia Sister    Hypertension Sister    Diabetes Sister    COPD Sister    Asthma Sister    Arthritis Sister    Breast cancer Sister    Heart attack Sister    Hyperlipidemia Sister    Heart disease Sister    Heart attack Sister    Diabetes Sister    Cancer Sister    Asthma Sister    Heart disease Brother    Heart attack Brother    Depression Brother    Hypertension Son    Hyperlipidemia Son    Heart disease Son    Depression Son    Hypertension Daughter    Depression Daughter    Asthma Daughter    Birth defects Paternal Uncle    Hearing loss  Brother      SOCIAL HISTORY   Social History   Tobacco Use   Smoking status: Every Day    Current packs/day: 0.50    Average packs/day: 0.5 packs/day for 55.3 years (27.7 ttl pk-yrs)    Types: Cigarettes    Start date: 03/01/1968   Smokeless tobacco: Never  Vaping Use   Vaping status: Former  Substance Use Topics   Alcohol use: No   Drug use: No     MEDICATIONS    Home Medication:  Current Outpatient Rx   Order #: [id number] Class: Normal   Order #: [id number] Class: Normal   Order #: [id number] Class: Historical Med   Order #: [id number] Class: Historical Med   Order #: [id number] Class: Normal   Order #: [id number] Class: Normal   Order #: [id number] Class: Normal   Order #: [id number] Class: Normal   Order #: [id number] Class: Historical Med   Order #: [id number] Class: Normal   Order #: [id number] Class: Normal   Order #: [id number] Class: Normal   Order #: [id number] Class: Normal   Order #: [id number] Class: Normal   Order #: [id number] Class: Normal   Order #: [id number] Class: Normal   Order #: [id number] Class: Historical Med   Order #: [id number] Class: Normal   Order #: [id number] Class: Normal    Current Medication:  Current Facility-Administered Medications:    [START ON 06/24/2023] cefTRIAXone (ROCEPHIN) 2 g in sodium chloride 0.9 % 100 mL IVPB, 2 g, Intravenous, Q24H, Wieting, Richard, MD   citalopram (CELEXA) tablet 10 mg, 10 mg, Oral, Daily, Wieting, Richard, MD, 10 mg at 06/23/23 1429   clopidogrel (PLAVIX) tablet 75 mg, 75 mg, Oral, Daily, Wieting, Richard, MD, 75 mg at 06/23/23 1430   doxycycline (VIBRA-TABS) tablet 100 mg, 100 mg, Oral, Q12H, Wieting, Richard, MD   enoxaparin (LOVENOX) injection 40 mg, 40 mg, Subcutaneous, Q24H, Wieting, Richard, MD   Melene Muller ON 06/24/2023] fluticasone (FLONASE) 50 MCG/ACT nasal spray 1 spray, 1 spray, Each Nare, Daily, Wieting, Richard, MD   Melene Muller ON 06/24/2023] fluticasone furoate-vilanterol (BREO ELLIPTA) 100-25 MCG/ACT 1 puff, 1 puff,  Inhalation, Daily, Wieting, Richard, MD   Melene Muller ON 06/24/2023] furosemide (LASIX) injection 40 mg, 40 mg, Intravenous, Daily, Wieting, Richard, MD   gabapentin (NEURONTIN) capsule 200 mg, 200 mg, Oral, QHS, Wieting, Richard, MD   insulin aspart (novoLOG) injection 0-5 Units, 0-5 Units, Subcutaneous, QHS, Wieting, Richard, MD   insulin aspart (novoLOG) injection 0-6 Units, 0-6 Units, Subcutaneous, TID WC, Wieting, Richard, MD, 4 Units at 06/23/23 1657   ipratropium-albuterol (DUONEB) 0.5-2.5 (3) MG/3ML nebulizer solution 3 mL, 3  mL, Nebulization, QID, Renae Gloss, Richard, MD, 3 mL at 06/23/23 1647   lacosamide (VIMPAT) tablet 150 mg, 150 mg, Oral, Q12H, Wieting, Richard, MD   LORazepam (ATIVAN) injection 1 mg, 1 mg, Intravenous, Q2H PRN, Bhagat, Srishti L, MD   losartan (COZAAR) tablet 100 mg, 100 mg, Oral, Daily, Renae Gloss, Richard, MD, 100 mg at 06/23/23 1430   methylPREDNISolone sodium succinate (SOLU-MEDROL) 40 mg/mL injection 40 mg, 40 mg, Intravenous, Q12H, Wieting, Richard, MD   metoprolol succinate (TOPROL-XL) 24 hr tablet 25 mg, 25 mg, Oral, Daily, Wieting, Richard, MD, 25 mg at 06/23/23 1430   montelukast (SINGULAIR) tablet 10 mg, 10 mg, Oral, QHS, Wieting, Richard, MD   ondansetron (ZOFRAN) tablet 4 mg, 4 mg, Oral, Q6H PRN **OR** ondansetron (ZOFRAN) injection 4 mg, 4 mg, Intravenous, Q6H PRN, Wieting, Richard, MD   pantoprazole (PROTONIX) EC tablet 40 mg, 40 mg, Oral, Daily, Wieting, Richard, MD   potassium chloride SA (KLOR-CON M) CR tablet 20 mEq, 20 mEq, Oral, Daily, Wieting, Richard, MD, 20 mEq at 06/23/23 1429   rosuvastatin (CRESTOR) tablet 10 mg, 10 mg, Oral, QHS, Wieting, Richard, MD   spironolactone (ALDACTONE) tablet 25 mg, 25 mg, Oral, q morning, Wieting, Richard, MD, 25 mg at 06/23/23 1430  Current Outpatient Medications:    citalopram (CELEXA) 10 MG tablet, TAKE 1 TABLET BY MOUTH DAILY, Disp: 90 tablet, Rfl: 3   clopidogrel (PLAVIX) 75 MG tablet, TAKE 1 TABLET BY MOUTH DAILY,  Disp: 90 tablet, Rfl: 1   fluticasone (FLONASE) 50 MCG/ACT nasal spray, Place 1 spray into both nostrils 2 (two) times daily., Disp: , Rfl:    fluticasone furoate-vilanterol (BREO ELLIPTA) 100-25 MCG/ACT AEPB, Inhale 1 puff into the lungs daily., Disp: , Rfl:    furosemide (LASIX) 40 MG tablet, Take 1 tablet (40 mg total) by mouth daily., Disp: 90 tablet, Rfl: 3   gabapentin (NEURONTIN) 100 MG capsule, Take 2 capsules (200 mg total) by mouth at bedtime., Disp: 180 capsule, Rfl: 3   glimepiride (AMARYL) 2 MG tablet, TAKE 1 TABLET BY MOUTH DAILY FOR DIABETES, Disp: 90 tablet, Rfl: 3   ipratropium-albuterol (DUONEB) 0.5-2.5 (3) MG/3ML SOLN, INHALE 1 VIAL USING NEBULIZER EVERY SIX HOURS AS NEEDED FOR SHORTNESS OF BREATH, Disp: 90 mL, Rfl: 1   Lacosamide (VIMPAT) 150 MG TABS, Take 150 mg by mouth every 12 (twelve) hours., Disp: , Rfl:    losartan (COZAAR) 100 MG tablet, TAKE 1 TABLET BY MOUTH DAILY AS DIRECTED, Disp: 90 tablet, Rfl: 3   metoprolol succinate (TOPROL-XL) 25 MG 24 hr tablet, TAKE 1 TABLET BY MOUTH DAILY, Disp: 90 tablet, Rfl: 3   montelukast (SINGULAIR) 10 MG tablet, TAKE 1 TABLET BY MOUTH AT BEDTIME, Disp: 90 tablet, Rfl: 3   pantoprazole (PROTONIX) 40 MG tablet, TAKE 1 TABLET BY MOUTH DAILY FOR REFLUX/HEARTBURN, Disp: 90 tablet, Rfl: 3   potassium chloride (KLOR-CON) 10 MEQ tablet, TAKE 2 TABLETS BY MOUTH DAILY, Disp: 180 tablet, Rfl: 3   rosuvastatin (CRESTOR) 10 MG tablet, TAKE 1 TABLET BY MOUTH AT BEDTIME, Disp: 90 tablet, Rfl: 3   spironolactone (ALDACTONE) 25 MG tablet, TAKE 1 TABLET BY MOUTH EVERY MORNING, Disp: 90 tablet, Rfl: 0   isosorbide mononitrate (IMDUR) 30 MG 24 hr tablet, Take 30 mg by mouth daily. (Patient not taking: Reported on 06/23/2023), Disp: , Rfl:    rOPINIRole (REQUIP) 0.5 MG tablet, Take 1 tablet (0.5 mg total) by mouth at bedtime. (Patient not taking: Reported on 06/23/2023), Disp: 30 tablet, Rfl: 0   traMADol (  ULTRAM) 50 MG tablet, Take 1 tablet (50 mg total)  by mouth every 12 (twelve) hours as needed for moderate pain or severe pain. (Patient not taking: Reported on 06/23/2023), Disp: 15 tablet, Rfl: 0    ALLERGIES   Celebrex [celecoxib], Ciprofloxacin, Codeine, Fosphenytoin, Levaquin [levofloxacin in d5w], Levofloxacin, Lovastatin, Pravastatin, Sulfa antibiotics, Aspirin, Azithromycin, and Penicillins     REVIEW OF SYSTEMS    Review of Systems:  Gen:  +  fever, sweats, chills weigh loss  HEENT: Denies blurred vision, double vision, ear pain, eye pain, hearing loss, nose bleeds, sore throat Cardiac:  No dizziness, chest pain or heaviness, chest tightness,edema Resp:   +  cough or sputum porduction, + shortness of breath, + wheezing, No hemoptysis,  Gi: Denies swallowing difficulty, stomach pain, nausea or vomiting, diarrhea, constipation, bowel incontinence Gu:  Denies bladder incontinence, burning urine Ext:   Denies Joint pain, stiffness or swelling Skin: Denies  skin rash, easy bruising or bleeding or hives Endoc:  Denies polyuria, polydipsia , polyphagia or weight change Psych:   Denies depression, insomnia or hallucinations   Other:  All other systems negative   VS: BP (!) 116/45   Pulse 70   Temp 97.9 F (36.6 C) (Oral)   Resp (!) 27   Ht 5\' 3"  (1.6 m)   Wt 74.8 kg   SpO2 90%   BMI 29.23 kg/m      PHYSICAL EXAM    GENERAL:laying flat in bed with nasal cannula on, good color. HEAD: Normocephalic, atraumatic.  EYES: Pupils equal, round, reactive to light. Extraocular muscles intact. No scleral icterus.  MOUTH: Moist mucosal membrane. Dentition intact. No abscess noted.  EAR, NOSE, THROAT: Clear without exudates. No external lesions.  NECK: Supple. No thyromegaly. No nodules. No JVD. No stridor PULMONARY:  rare wheezes, some what still tight CARDIOVASCULAR: S1 and S2. Regular rate and rhythm. + murmur, rubs, or gallops. No edema. Pedal pulses 2+ bilaterally.  GASTROINTESTINAL: Soft, nontender, nondistended. No  masses. Positive bowel sounds. No hepatosplenomegaly.  MUSCULOSKELETAL: No swelling, clubbing, or edema. Range of motion full in all extremities.  NEUROLOGIC: Cranial nerves II through XII are intact. No gross focal neurological deficits. Sensation intact. Reflexes intact.  SKIN: No ulceration, lesions, rashes, or cyanosis. Skin warm and dry. Turgor intact.  PSYCHIATRIC: Mood, affect within normal limits. The patient is awake, alert and oriented x 3. Insight, judgment intact.       IMAGING    DG Chest Portable 1 View Result Date: 06/23/2023 CLINICAL DATA:  COPD.  CHF. EXAM: PORTABLE CHEST 1 VIEW COMPARISON:  04/14/2023 FINDINGS: The heart size and mediastinal contours are within normal limits. Both lungs are clear. Status post right shoulder arthroplasty. IMPRESSION: No active disease. Electronically Signed   By: Signa Kell M.D.   On: 06/23/2023 06:38   Coronavirus 229E NOT DETECTED  Comment: (NOTE) The Coronavirus on the Respiratory Panel, DOES NOT test for the novel Coronavirus (2019 nCoV)  Coronavirus HKU1 NOT DETECTED  Coronavirus NL63 NOT DETECTED  Coronavirus OC43 NOT DETECTED  Metapneumovirus NOT DETECTED  Rhinovirus / Enterovirus NOT DETECTED  Influenza A NOT DETECTED  Influenza B NOT DETECTED  Parainfluenza Virus 1 NOT DETECTED  Parainfluenza Virus 2 NOT DETECTED  Parainfluenza Virus 3 NOT DETECTED  Parainfluenza Virus 4 NOT DETECTED  Respiratory Syncytial Virus NOT DETECTED  Bordetella pertussis NOT DETECTED  Bordetella Parapertussis NOT DETECTED  Chlamydophila pneumoniae NOT DETECTED  Mycoplasma pneumoniae NOT DETECTED   BILEVEL POSITIVE 12/17/24AIRWAY PRESSURE BILEVEL POSITIVE AIRWAY PRESSURE  NASAL CANNULA  BILEVEL POSITIVE AIRWAY PRESSURE     Inspiratory PAP 10    12    Expiratory PAP 5.0    8    pH, Arterial 7.4  7.54 High  7.43 7.33 Low     pCO2 arterial 43  41 46 49 High     pO2, Arterial 53 Low   58 Low  70 Low  158 High     Bicarbonate 26.6 26.5 35.1  High  30.5 High  25.8 31.8 High  29.8 High   Acid-Base Excess 1.5 2.0 11.4 High  5.3 High   6.1 High  4.2 High   O2 Saturation 88.3 78.3 92.3 96.5 99.9 81.7 84.6  Patient temperature 37.0 37.0 37.0 37.0 37.0 37.0 37.0  Collection site RIGHT BRACHIAL VEIN CM RIGHT RADIAL RIGHT RADIAL RIGHT RADIAL VENOUS VENOUS  Allens test (pass/fail) PASS          ASSESSMENT/PLAN   This is a pleasant lady seem to be here more so for a copd flare. Does not appear ailure is an issue ( hx of mod as, diastolic dysfunction). No signs of pneumonia. Presented with sirs. With hydration she may blossom. On rocephin and doxycycline.  -agree with continuing the antibiotics -wean fio2 down as tolerated -solumedrol, duo nebs, breo -dvt prophylaxis -d-dimer, -following     Thank you for allowing me to participate in the care of this patient.   Patient/Family are satisfied with care plan and all questions have been answered.  This document was prepared using Dragon voice recognition software and may include unintentional dictation errors.     Ned Clines, M.D.  Division of Pulmonary & Critical Care Medicine  [facility]

## 2023-06-23 NOTE — Assessment & Plan Note (Signed)
Sliding scale insulin hold oral medications

## 2023-06-23 NOTE — Assessment & Plan Note (Signed)
Patient made a DNR ?

## 2023-06-23 NOTE — Progress Notes (Signed)
Pt taken off bipap and placed on 5lpm [place], tolerating well at this time. RN aware.

## 2023-06-23 NOTE — ED Provider Notes (Signed)
[facility] Provider Note    Event Date/Time   First MD Initiated Contact with Patient 06/23/23 0413     (approximate)   History   Shortness of Breath   HPI  [name] is a 72 y.o. female who presents to the ED for evaluation of Shortness of Breath   I review a medical DC summary from 3 months ago.  ASL dependent patient with history of COPD, dCHF, moderate aortic stenosis, chronic respiratory failure on 2 L.  Patient resents for evaluation of a couple days of worsening shortness of breath and productive cough.  Increased sputum from baseline   Physical Exam   Triage Vital Signs: ED Triage Vitals  Encounter Vitals Group     BP      Systolic BP Percentile      Diastolic BP Percentile      Pulse      Resp      Temp      Temp src      SpO2      Weight      Height      Head Circumference      Peak Flow      Pain Score      Pain Loc      Pain Education      Exclude from Growth Chart     Most recent vital signs: Vitals:   06/23/23 0627 06/23/23 0630  BP:  (!) 128/56  Pulse:  93  Resp:  (!) 38  Temp: 99.2 F (37.3 C)   SpO2:  90%    General: Awake, no distress.  Warm to the touch, speaking in short phrases only CV:  Good peripheral perfusion.  Resp:  Tachypnea, diffuse wheezing and poor airflow Abd:  No distention.  MSK:  No deformity noted.  Neuro:  No focal deficits appreciated. Other:     ED Results / Procedures / Treatments   Labs (all labs ordered are listed, but only abnormal results are displayed) Labs Reviewed  COMPREHENSIVE METABOLIC PANEL - Abnormal; Notable for the following components:      Result Value   Potassium 3.1 (*)    Glucose, Bld 211 (*)    All other components within normal limits  CBC WITH DIFFERENTIAL/PLATELET - Abnormal; Notable for the following components:   WBC 13.2 (*)    RBC 5.13 (*)    Neutro Abs 10.0 (*)    All other components within normal limits  LACTIC ACID, PLASMA - Abnormal;  Notable for the following components:   Lactic Acid, Venous 2.5 (*)    All other components within normal limits  CULTURE, BLOOD (ROUTINE X 2)  CULTURE, BLOOD (ROUTINE X 2)  RESP PANEL BY RT-PCR (RSV, FLU A&B, COVID)  RVPGX2  PROTIME-INR  APTT  BRAIN NATRIURETIC PEPTIDE  LACTIC ACID, PLASMA  PROCALCITONIN  TROPONIN I (HIGH SENSITIVITY)  TROPONIN I (HIGH SENSITIVITY)    EKG Very poor quality EKG seems to demonstrate a sinus rhythm with a rate of 93 bpm.  No clear STEMI.  RADIOLOGY CXR interpreted by me without evidence of acute cardiopulmonary pathology.  Official radiology report(s): DG Chest Portable 1 View Result Date: 06/23/2023 CLINICAL DATA:  COPD.  CHF. EXAM: PORTABLE CHEST 1 VIEW COMPARISON:  04/14/2023 FINDINGS: The heart size and mediastinal contours are within normal limits. Both lungs are clear. Status post right shoulder arthroplasty. IMPRESSION: No active disease. Electronically Signed   By: Signa Kell M.D.   On: 06/23/2023  06:38    PROCEDURES and INTERVENTIONS:  .1-3 Lead EKG Interpretation  Performed by: Delton Prairie, MD Authorized by: Delton Prairie, MD     Interpretation: normal     ECG rate:  90   ECG rate assessment: normal     Rhythm: sinus rhythm     Ectopy: none     Conduction: normal   .Critical Care  Performed by: Delton Prairie, MD Authorized by: Delton Prairie, MD   Critical care provider statement:    Critical care time (minutes):  30   Critical care time was exclusive of:  Separately billable procedures and treating other patients   Critical care was necessary to treat or prevent imminent or life-threatening deterioration of the following conditions:  Respiratory failure and sepsis   Critical care was time spent personally by me on the following activities:  Development of treatment plan with patient or surrogate, discussions with consultants, evaluation of patient's response to treatment, examination of patient, ordering and review of  laboratory studies, ordering and review of radiographic studies, ordering and performing treatments and interventions, pulse oximetry, re-evaluation of patient's condition and review of old charts   Medications  cefTRIAXone (ROCEPHIN) 2 g in sodium chloride 0.9 % 100 mL IVPB (2 g Intravenous New Bag/Given 06/23/23 0630)  azithromycin (ZITHROMAX) 500 mg in sodium chloride 0.9 % 250 mL IVPB (500 mg Intravenous New Bag/Given 06/23/23 0627)  ipratropium-albuterol (DUONEB) 0.5-2.5 (3) MG/3ML nebulizer solution 6 mL (6 mLs Nebulization Given 06/23/23 0447)  methylPREDNISolone sodium succinate (SOLU-MEDROL) 125 mg/2 mL injection 125 mg (125 mg Intravenous Given 06/23/23 0444)  acetaminophen (TYLENOL) tablet 1,000 mg (1,000 mg Oral Given 06/23/23 0444)  sodium chloride 0.9 % bolus 1,000 mL (0 mLs Intravenous Stopped 06/23/23 0627)  ondansetron (ZOFRAN) injection 4 mg (4 mg Intravenous Given 06/23/23 0446)     IMPRESSION / MDM / ASSESSMENT AND PLAN / ED COURSE  I reviewed the triage vital signs and the nursing notes.  Differential diagnosis includes, but is not limited to, ACS, PTX, PNA, muscle strain/spasm, PE, dissection, anxiety, pleural effusion  {Patient presents with symptoms of an acute illness or injury that is potentially life-threatening.  Patient with CHF, COPD and chronic respiratory failure presents with worsening shortness of breath and cough, found of evidence of sepsis, CHF and COPD exacerbation requiring BiPAP and medical admission.  Febrile, tachycardic and tachypneic but hemodynamically stable.  Leukocytosis is noted.  Mild hyperglycemia without acidosis.  Negative troponin.  Lactic acid is mildly elevated and we will trend this.  CXR is clear but all of her symptoms are respiratory and I suspect has a pneumonia that has not yet developed radiographically.  Provided CAP coverage antibiotics and will consult medicine for admission.  Clinical Course as of 06/23/23 0645  Tue Jun 23, 2023  0447 Reassessed. Vomiting. Had to take off the bipap. Gave zofran, we'll try again [DS]  0526 Reassessed. Back on BiPAP, pulling good volumes [DS]    Clinical Course User Index [DS] Delton Prairie, MD     FINAL CLINICAL IMPRESSION(S) / ED DIAGNOSES   Final diagnoses:  Acute respiratory failure with hypoxia and hypercapnia (HCC)     Rx / DC Orders   ED Discharge Orders     None        Note:  This document was prepared using Dragon voice recognition software and may include unintentional dictation errors.   Delton Prairie, MD 06/23/23 [phone]

## 2023-06-23 NOTE — Inpatient Diabetes Management (Signed)
Inpatient Diabetes Program Recommendations  AACE/ADA: New Consensus Statement on Inpatient Glycemic Control (2015)  Target Ranges:  Prepandial:   less than 140 mg/dL      Peak postprandial:   less than 180 mg/dL (1-2 hours)      Critically ill patients:  140 - 180 mg/dL   Lab Results  Component Value Date   GLUCAP 332 (H) 06/23/2023   HGBA1C 7.1 (H) 03/19/2023    Review of Glycemic Control  Diabetes history: DM2 Outpatient Diabetes medications: Amaryl 2 mg daily Current orders for Inpatient glycemic control: Novolog 0-6 TID with meals and 0-5 HS  HgbA1C - 7.1%  Inpatient Diabetes Program Recommendations:    Consider increasing Novolog to 0-9 units TID with meals and 0-5 HS  May need meal coverage insulin while on steroids if CBGs > 180 mg/dL  Continue to follow trends.  Thank you. Ailene Ards, RD, LDN, CDCES Inpatient Diabetes Coordinator [phone]

## 2023-06-23 NOTE — ED Notes (Signed)
Pt given crackers and peanut butter upon request.

## 2023-06-23 NOTE — Assessment & Plan Note (Signed)
Patient able to read lips

## 2023-06-24 ENCOUNTER — Inpatient Hospital Stay: Payer: 59

## 2023-06-24 DIAGNOSIS — J441 Chronic obstructive pulmonary disease with (acute) exacerbation: Secondary | ICD-10-CM | POA: Diagnosis not present

## 2023-06-24 DIAGNOSIS — I350 Nonrheumatic aortic (valve) stenosis: Secondary | ICD-10-CM

## 2023-06-24 DIAGNOSIS — R569 Unspecified convulsions: Secondary | ICD-10-CM | POA: Diagnosis not present

## 2023-06-24 DIAGNOSIS — B953 Streptococcus pneumoniae as the cause of diseases classified elsewhere: Secondary | ICD-10-CM | POA: Diagnosis not present

## 2023-06-24 DIAGNOSIS — R0603 Acute respiratory distress: Secondary | ICD-10-CM | POA: Diagnosis not present

## 2023-06-24 DIAGNOSIS — R7881 Bacteremia: Secondary | ICD-10-CM

## 2023-06-24 DIAGNOSIS — F17210 Nicotine dependence, cigarettes, uncomplicated: Secondary | ICD-10-CM

## 2023-06-24 LAB — BASIC METABOLIC PANEL
Anion gap: 9 (ref 5–15)
BUN: 22 mg/dL (ref 8–23)
CO2: 26 mmol/L (ref 22–32)
Calcium: 8.5 mg/dL — ABNORMAL LOW (ref 8.9–10.3)
Chloride: 103 mmol/L (ref 98–111)
Creatinine, Ser: 0.76 mg/dL (ref 0.44–1.00)
GFR, Estimated: >60 mL/min (ref >60)
Glucose, Bld: 234 mg/dL — ABNORMAL HIGH (ref 70–99)
Potassium: 3.7 mmol/L (ref 3.5–5.1)
Sodium: 138 mmol/L (ref 135–145)

## 2023-06-24 LAB — CBC
HCT: 37.5 % (ref 36.0–46.0)
Hemoglobin: 12.3 g/dL (ref 12.0–15.0)
MCH: 27.8 pg (ref 26.0–34.0)
MCHC: 32.8 g/dL (ref 30.0–36.0)
MCV: 84.7 fL (ref 80.0–100.0)
Platelets: 203 10*3/uL (ref 150–400)
RBC: 4.43 MIL/uL (ref 3.87–5.11)
RDW: 13.8 % (ref 11.5–15.5)
WBC: 15 10*3/uL — ABNORMAL HIGH (ref 4.0–10.5)
nRBC: 0 % (ref 0.0–0.2)

## 2023-06-24 LAB — EXPECTORATED SPUTUM ASSESSMENT W GRAM STAIN, RFLX TO RESP C

## 2023-06-24 LAB — CBG MONITORING, ED
Glucose-Capillary: 267 mg/dL — ABNORMAL HIGH (ref 70–99)
Glucose-Capillary: 218 mg/dL — ABNORMAL HIGH (ref 70–99)
Glucose-Capillary: 347 mg/dL — ABNORMAL HIGH (ref 70–99)

## 2023-06-24 LAB — URINE CULTURE: Special Requests: NORMAL

## 2023-06-24 LAB — GLUCOSE, CAPILLARY: Glucose-Capillary: 320 mg/dL — ABNORMAL HIGH (ref 70–99)

## 2023-06-24 LAB — TROPONIN I (HIGH SENSITIVITY): Troponin I (High Sensitivity): 16 ng/L (ref <18)

## 2023-06-24 MED ORDER — INSULIN GLARGINE-YFGN 100 UNIT/ML ~~LOC~~ SOLN
10.0000 [IU] | Freq: Every day | SUBCUTANEOUS | Status: DC
  Administered 2023-06-24 – 2023-06-25 (×2): 10 [IU] via SUBCUTANEOUS
  Filled 2023-06-24 (×2): qty 0.1

## 2023-06-24 MED ORDER — IOHEXOL 350 MG/ML SOLN
75.0000 mL | Freq: Once | INTRAVENOUS | Status: AC | PRN
  Administered 2023-06-24: 75 mL via INTRAVENOUS

## 2023-06-24 MED ORDER — ACETAMINOPHEN 325 MG PO TABS: 650.0000 mg | ORAL_TABLET | Freq: Four times a day (QID) | ORAL | Status: DC | PRN

## 2023-06-24 NOTE — ED Notes (Addendum)
Pt c/o of L leg heaviness, DR Wouk at bedside.

## 2023-06-24 NOTE — Evaluation (Signed)
Occupational Therapy Evaluation Patient Details Name: [name] MRN: [id number] DOB: 1950/07/31 Today's Date: 06/24/2023   History of Present Illness Pt is a 72 y.o. female admitted with systemic inflammatory response syndrome, respiratory distress and seizure. PMH significant of CHF, nash cirrhosis, COPD on chronic oxygen, deaf, depression, diabetes, GERD, HTN, rheumatoid arthritis, & seizures   Clinical Impression   Prior to hospital admission, pt was independent, lives with family. Deaf, but reads lips well. Bed mobility mod I from ED stretcher, functional mobility with no AD CGA for safety to toilet, CGA level for toileting tasks, hygiene, standing at sink to wash hands. Mild unsteadiness while mobilizing, able to self-correct. Demonstrates decreased activity tolerance and SOB with exertion, on RA with sats 87-94%. Pt would benefit from skilled OT services to address noted impairments and functional limitations (see below for any additional details) in order to maximize safety and independence while minimizing falls risk and caregiver burden. Anticipate the need for follow up [facility] OT services upon acute hospital DC.        If plan is discharge home, recommend the following: A little help with walking and/or transfers;A little help with bathing/dressing/bathroom;Assist for transportation    Functional Status Assessment  Patient has had a recent decline in their functional status and demonstrates the ability to make significant improvements in function in a reasonable and predictable amount of time.  Equipment Recommendations  None recommended by OT    Recommendations for Other Services Other (comment)     Precautions / Restrictions Precautions Precautions: Fall Precaution Comments: deaf Restrictions Weight Bearing Restrictions Per Provider Order: No      Mobility Bed Mobility Overal bed mobility: Modified Independent                  Transfers Overall transfer  level: Needs assistance Equipment used: 1 person hand held assist Transfers: Sit to/from Stand Sit to Stand: Contact guard assist                  Balance Overall balance assessment: Needs assistance, Mild deficits observed, not formally tested Sitting-balance support: Feet unsupported Sitting balance-Leahy Scale: Good     Standing balance support: No upper extremity supported, During functional activity Standing balance-Leahy Scale: Fair Standing balance comment: some bouts of unsteadiness while walking, no overt LOB                           ADL either performed or assessed with clinical judgement   ADL Overall ADL's : Needs assistance/impaired Eating/Feeding: Set up;Sitting   Grooming: Sitting;Set up   Upper Body Bathing: Sitting;Set up   Lower Body Bathing: Minimal assistance;Sit to/from stand;Sitting/lateral leans   Upper Body Dressing : Sitting;Set up   Lower Body Dressing: Minimal assistance;Sit to/from stand;Sitting/lateral leans   Toilet Transfer: Contact guard Designer, fashion/clothing- Clothing Manipulation and Hygiene: Contact guard assist;Sitting/lateral lean       Functional mobility during ADLs: Contact guard assist;+2 for safety/equipment General ADL Comments: Occassional +2 for lines/leads, room setup. Pt completing functional mobility in room with CGA, no AD to toilet, performs toileting tasks with CGA and stands at sink to wash hands.     Vision Baseline Vision/History: 0 No visual deficits              Pertinent Vitals/Pain Pain Assessment Pain Assessment: No/denies pain     Extremity/Trunk Assessment Upper Extremity Assessment Upper Extremity Assessment: Overall WFL for tasks assessed   Lower  Extremity Assessment Lower Extremity Assessment: LLE deficits/detail LLE Deficits / Details: reports L LE feels heavy with ambulation. RN notified. Some limping noted with ambulation. LLE Sensation: decreased  light touch   Cervical / Trunk Assessment Cervical / Trunk Assessment: Normal   Communication Communication Communication: Hearing impairment;Other (comment) (reads lips, deaf) Cueing Techniques: Verbal cues;Gestural cues   Cognition Arousal: Alert Behavior During Therapy: WFL for tasks assessed/performed Overall Cognitive Status: Within Functional Limits for tasks assessed                                       General Comments  Pt reporting LLE feeling heavy. On 5L O2 via [place] upon arrival, SpO2 100%. O2 removed to trial RA while ambulating, O2 dropping to 89% but quickly returning to 92%. Left on RA, SpO2 95%. RN notified.            Home Living Family/patient expects to be discharged to:: Private residence Living Arrangements: Children Available Help at Discharge: Family;Available PRN/intermittently Type of Home: Apartment Home Access: Level entry     Home Layout: One level     Bathroom Shower/Tub: Chief Strategy Officer: Standard     Home Equipment: Agricultural consultant (2 wheels);Rollator (4 wheels);Shower seat - built in;Wheelchair - manual;Cane - single point;Other (comment)   Additional Comments: Pt lives with son who works 12-hour shifts during the day ~3x/week      Prior Functioning/Environment Prior Level of Function : Independent/Modified Independent             Mobility Comments: Patient/daughter report she does not use AD at baseline and wears O2 at night ADLs Comments: Independent with ADL/IADL management, driving, walks to mail box each day. Denies falls history.        OT Problem List: Decreased activity tolerance;Impaired balance (sitting and/or standing);Cardiopulmonary status limiting activity      OT Treatment/Interventions: Self-care/ADL training;Therapeutic exercise;Neuromuscular education;Energy conservation;DME and/or AE instruction;Therapeutic activities;Patient/family education;Balance training    OT  Goals(Current goals can be found in the care plan section) Acute Rehab OT Goals OT Goal Formulation: With patient/family Time For Goal Achievement: 07/08/23 Potential to Achieve Goals: Good  OT Frequency: Min 1X/week    Co-evaluation PT/OT/SLP Co-Evaluation/Treatment: Yes Reason for Co-Treatment: For patient/therapist safety;To address functional/ADL transfers PT goals addressed during session: Mobility/safety with mobility;Balance OT goals addressed during session: ADL's and self-care      AM-PAC OT "6 Clicks" Daily Activity     Outcome Measure Help from another person eating meals?: None Help from another person taking care of personal grooming?: None Help from another person toileting, which includes using toliet, bedpan, or urinal?: A Little Help from another person bathing (including washing, rinsing, drying)?: A Little Help from another person to put on and taking off regular upper body clothing?: A Little Help from another person to put on and taking off regular lower body clothing?: A Little 6 Click Score: 20   End of Session Equipment Utilized During Treatment: Oxygen Nurse Communication: Mobility status;Other (comment) (left on RA)  Activity Tolerance: Patient tolerated treatment well Patient left: in bed;with family/visitor present;with call bell/phone within reach  OT Visit Diagnosis: Other abnormalities of gait and mobility (R26.89);Unsteadiness on feet (R26.81)                Time: 3244-0102 OT Time Calculation (min): 18 min Charges:  OT General Charges $OT Visit: 1 Visit OT Evaluation $OT  Eval Low Complexity: 1 Low  Analena Gama L. Justn Quale, OTR/L  06/24/23, 1:31 PM

## 2023-06-24 NOTE — Progress Notes (Signed)
Pulmonary Medicine          Date: 06/24/2023,   MRN# [id number] [name] Nov 26, 1950     AdmissionWeight: 74.8 kg                 CurrentWeight: 74.8 kg    HISTORY OF PRESENT ILLNESS   This is a 72 year old lady, well known to my service, DNR, severe copd/asthma, on oxygen at 2 liters, deaf, chf, moderate AS,  cirrhosis, who came to the ER, complaining with increase shortness of breath over several days and difficulty getting her breath.  Vague chest pain, NO PLEURISY calf pain, fever, tachy. She was promptly placed on bipap. Improved. At noon when I saw her she was off bipap. On five liters oxygen. Speaking in full sentences. Family in the room. Came back at five, voices that her breathing has improved. Still on five liters. Sats 90-92 %.    There was a brief bout of what look like a seizure. Presently mentally alert. No siff neck or sinus pressure nor unusual headache  Today showing signs of progressive improvement. Sats staying steadi, on 4 liters. Elevated d-dimer. Reviewed chest ct with her and the family. No pulmonary embolism.      PAST MEDICAL HISTORY   Past Medical History:  Diagnosis Date   Asthma    CHF (congestive heart failure) (HCC)    Cirrhosis, non-alcoholic (HCC)    COPD (chronic obstructive pulmonary disease) (HCC)    Deaf    Depression    Diabetes mellitus without complication (HCC)    GERD (gastroesophageal reflux disease)    Heart murmur    Hepatitis    History of rheumatic fever    History of scarlet fever    Hypertension    IBS (irritable bowel syndrome)    Lymph node disorder    arm   Neuropathy    On home oxygen therapy    hs   Orthopnea    Osteoarthritis    RA (rheumatoid arthritis) (HCC)    RLS (restless legs syndrome)    Seizures (HCC)    Shortness of breath dyspnea    Sleep apnea    Stroke (HCC)    tia     SURGICAL HISTORY   Past Surgical History:  Procedure Laterality Date   ABDOMINAL HYSTERECTOMY      BREAST BIOPSY Right 10/30/2020   Stereo Bx, X-clip,  BENIGN BREAST TISSUE WITH FIBROADENOMATOUS   CATARACT EXTRACTION W/PHACO Right 11/23/2014   Procedure: CATARACT EXTRACTION PHACO AND INTRAOCULAR LENS PLACEMENT (IOC);  Surgeon: Lia Hopping, MD;  Location: [facility];  Service: Ophthalmology;  Laterality: Right;   CATARACT EXTRACTION W/PHACO Left 12/14/2014   Procedure: CATARACT EXTRACTION PHACO AND INTRAOCULAR LENS PLACEMENT (IOC);  Surgeon: Lia Hopping, MD;  Location: [facility];  Service: Ophthalmology;  Laterality: Left;  US:01:16.6 AP:15.8 CDE:12.14   CESAREAN SECTION     CHOLECYSTECTOMY     COLONOSCOPY WITH PROPOFOL N/A 10/08/2020   Procedure: COLONOSCOPY WITH PROPOFOL;  Surgeon: Toledo, Boykin Nearing, MD;  Location: [facility];  Service: Gastroenterology;  Laterality: N/A;   ESOPHAGOGASTRODUODENOSCOPY (EGD) WITH PROPOFOL N/A 10/08/2020   Procedure: ESOPHAGOGASTRODUODENOSCOPY (EGD) WITH PROPOFOL;  Surgeon: Toledo, Boykin Nearing, MD;  Location: [facility];  Service: Gastroenterology;  Laterality: N/A;  DM DEAF, NEEDS SIGN INTERPRETER PER SON   REVERSE SHOULDER ARTHROPLASTY Right 12/21/2019   Procedure: REVERSE SHOULDER ARTHROPLASTY;  Surgeon: Lyndle Herrlich, MD;  Location: [facility];  Service: Orthopedics;  Laterality: Right;  THUMB ARTHROSCOPY     TONSILLECTOMY     TYMPANOPLASTY     muliple     FAMILY HISTORY   Family History  Problem Relation Age of Onset   Lung cancer Mother    CAD Father    Miscarriages / Stillbirths Sister    Hyperlipidemia Sister    Hypertension Sister    Diabetes Sister    COPD Sister    Asthma Sister    Arthritis Sister    Breast cancer Sister    Heart attack Sister    Hyperlipidemia Sister    Heart disease Sister    Heart attack Sister    Diabetes Sister    Cancer Sister    Asthma Sister    Heart disease Brother    Heart attack Brother    Depression Brother    Hypertension Son    Hyperlipidemia Son    Heart disease Son     Depression Son    Hypertension Daughter    Depression Daughter    Asthma Daughter    Birth defects Paternal Uncle    Hearing loss Brother      SOCIAL HISTORY   Social History   Tobacco Use   Smoking status: Every Day    Current packs/day: 0.50    Average packs/day: 0.5 packs/day for 55.3 years (27.7 ttl pk-yrs)    Types: Cigarettes    Start date: 03/01/1968   Smokeless tobacco: Never  Vaping Use   Vaping status: Former  Substance Use Topics   Alcohol use: No   Drug use: No     MEDICATIONS    Home Medication:  Current Outpatient Rx   Order #: [id number] Class: Normal   Order #: [id number] Class: Normal   Order #: [id number] Class: Historical Med   Order #: [id number] Class: Historical Med   Order #: [id number] Class: Normal   Order #: [id number] Class: Normal   Order #: [id number] Class: Normal   Order #: [id number] Class: Normal   Order #: [id number] Class: Historical Med   Order #: [id number] Class: Normal   Order #: [id number] Class: Normal   Order #: [id number] Class: Normal   Order #: [id number] Class: Normal   Order #: [id number] Class: Normal   Order #: [id number] Class: Normal   Order #: [id number] Class: Normal   Order #: [id number] Class: Historical Med   Order #: [id number] Class: Normal   Order #: [id number] Class: Normal    Current Medication:  Current Facility-Administered Medications:    cefTRIAXone (ROCEPHIN) 2 g in sodium chloride 0.9 % 100 mL IVPB, 2 g, Intravenous, Q24H, Alford Highland, MD, Stopped at 06/24/23 0745   citalopram (CELEXA) tablet 10 mg, 10 mg, Oral, Daily, Renae Gloss, Richard, MD, 10 mg at 06/24/23 0175   clopidogrel (PLAVIX) tablet 75 mg, 75 mg, Oral, Daily, Wieting, Richard, MD, 75 mg at 06/24/23 0959   enoxaparin (LOVENOX) injection 40 mg, 40 mg, Subcutaneous, Q24H, Wieting, Richard, MD, 40 mg at 06/23/23 2120   fluticasone (FLONASE) 50 MCG/ACT nasal spray 1 spray, 1 spray, Each Nare, Daily, Wieting, Richard, MD   fluticasone furoate-vilanterol (BREO ELLIPTA)  100-25 MCG/ACT 1 puff, 1 puff, Inhalation, Daily, Wieting, Richard, MD   furosemide (LASIX) injection 40 mg, 40 mg, Intravenous, Daily, Wieting, Richard, MD, 40 mg at 06/24/23 0959   gabapentin (NEURONTIN) capsule 200 mg, 200 mg, Oral, QHS, Wieting, Richard, MD, 200 mg at 06/23/23 2120   insulin aspart (novoLOG) injection 0-5 Units, 0-5 Units, Subcutaneous, QHS, Wieting, Richard, MD, 2 Units at 06/23/23 2128   insulin aspart (novoLOG) injection 0-6 Units, 0-6 Units, Subcutaneous, TID WC,  Alford Highland, MD, 2 Units at 06/24/23 1225   insulin glargine-yfgn (SEMGLEE) injection 10 Units, 10 Units, Subcutaneous, Daily, Wouk, Wilfred Curtis, MD, 10 Units at 06/24/23 1226   ipratropium-albuterol (DUONEB) 0.5-2.5 (3) MG/3ML nebulizer solution 3 mL, 3 mL, Nebulization, QID, Wieting, Richard, MD, 3 mL at 06/24/23 1551   lacosamide (VIMPAT) tablet 150 mg, 150 mg, Oral, Q12H, Wieting, Richard, MD, 150 mg at 06/24/23 1000   LORazepam (ATIVAN) injection 1 mg, 1 mg, Intravenous, Q2H PRN, Bhagat, Srishti L, MD, 1 mg at 06/24/23 1017   methylPREDNISolone sodium succinate (SOLU-MEDROL) 40 mg/mL injection 40 mg, 40 mg, Intravenous, Q12H, Wieting, Richard, MD, 40 mg at 06/24/23 0959   metoprolol succinate (TOPROL-XL) 24 hr tablet 25 mg, 25 mg, Oral, Daily, Wieting, Richard, MD, 25 mg at 06/24/23 0959   montelukast (SINGULAIR) tablet 10 mg, 10 mg, Oral, QHS, Wieting, Richard, MD, 10 mg at 06/23/23 2120   ondansetron (ZOFRAN) tablet 4 mg, 4 mg, Oral, Q6H PRN **OR** ondansetron (ZOFRAN) injection 4 mg, 4 mg, Intravenous, Q6H PRN, Renae Gloss, Richard, MD   pantoprazole (PROTONIX) EC tablet 40 mg, 40 mg, Oral, Daily, Renae Gloss, Richard, MD, 40 mg at 06/24/23 0959   potassium chloride SA (KLOR-CON M) CR tablet 20 mEq, 20 mEq, Oral, Daily, Wieting, Richard, MD, 20 mEq at 06/24/23 0959   rosuvastatin (CRESTOR) tablet 10 mg, 10 mg, Oral, QHS, Wieting, Richard, MD, 10 mg at 06/23/23 2120  Current Outpatient Medications:     citalopram (CELEXA) 10 MG tablet, TAKE 1 TABLET BY MOUTH DAILY, Disp: 90 tablet, Rfl: 3   clopidogrel (PLAVIX) 75 MG tablet, TAKE 1 TABLET BY MOUTH DAILY, Disp: 90 tablet, Rfl: 1   fluticasone (FLONASE) 50 MCG/ACT nasal spray, Place 1 spray into both nostrils 2 (two) times daily., Disp: , Rfl:    fluticasone furoate-vilanterol (BREO ELLIPTA) 100-25 MCG/ACT AEPB, Inhale 1 puff into the lungs daily., Disp: , Rfl:    furosemide (LASIX) 40 MG tablet, Take 1 tablet (40 mg total) by mouth daily., Disp: 90 tablet, Rfl: 3   gabapentin (NEURONTIN) 100 MG capsule, Take 2 capsules (200 mg total) by mouth at bedtime., Disp: 180 capsule, Rfl: 3   glimepiride (AMARYL) 2 MG tablet, TAKE 1 TABLET BY MOUTH DAILY FOR DIABETES, Disp: 90 tablet, Rfl: 3   ipratropium-albuterol (DUONEB) 0.5-2.5 (3) MG/3ML SOLN, INHALE 1 VIAL USING NEBULIZER EVERY SIX HOURS AS NEEDED FOR SHORTNESS OF BREATH, Disp: 90 mL, Rfl: 1   Lacosamide (VIMPAT) 150 MG TABS, Take 150 mg by mouth every 12 (twelve) hours., Disp: , Rfl:    losartan (COZAAR) 100 MG tablet, TAKE 1 TABLET BY MOUTH DAILY AS DIRECTED, Disp: 90 tablet, Rfl: 3   metoprolol succinate (TOPROL-XL) 25 MG 24 hr tablet, TAKE 1 TABLET BY MOUTH DAILY, Disp: 90 tablet, Rfl: 3   montelukast (SINGULAIR) 10 MG tablet, TAKE 1 TABLET BY MOUTH AT BEDTIME, Disp: 90 tablet, Rfl: 3   pantoprazole (PROTONIX) 40 MG tablet, TAKE 1 TABLET BY MOUTH DAILY FOR REFLUX/HEARTBURN, Disp: 90 tablet, Rfl: 3   potassium chloride (KLOR-CON) 10 MEQ tablet, TAKE 2 TABLETS BY MOUTH DAILY, Disp: 180 tablet, Rfl: 3   rosuvastatin (CRESTOR) 10 MG tablet, TAKE 1 TABLET BY MOUTH AT BEDTIME, Disp: 90 tablet, Rfl: 3   spironolactone (ALDACTONE) 25 MG tablet, TAKE 1 TABLET BY MOUTH EVERY MORNING, Disp: 90 tablet, Rfl: 0   isosorbide mononitrate (IMDUR) 30 MG 24 hr tablet, Take 30 mg by mouth daily. (Patient not taking: Reported on 06/23/2023), Disp: , Rfl:  rOPINIRole (REQUIP) 0.5 MG tablet, Take 1 tablet (0.5 mg  total) by mouth at bedtime. (Patient not taking: Reported on 06/23/2023), Disp: 30 tablet, Rfl: 0   traMADol (ULTRAM) 50 MG tablet, Take 1 tablet (50 mg total) by mouth every 12 (twelve) hours as needed for moderate pain or severe pain. (Patient not taking: Reported on 06/23/2023), Disp: 15 tablet, Rfl: 0    ALLERGIES   Celebrex [celecoxib], Ciprofloxacin, Codeine, Fosphenytoin, Levaquin [levofloxacin in d5w], Levofloxacin, Lovastatin, Pravastatin, Sulfa antibiotics, Aspirin, Azithromycin, and Penicillins     REVIEW OF SYSTEMS    Review of Systems:  Gen:  Denies  fever, sweats, chills weigh loss  HEENT: Denies blurred vision, double vision, ear pain, eye pain, hearing loss, nose bleeds, sore throat Cardiac:  No dizziness, chest pain or heaviness, chest tightness,edema Resp:   less cough, mild  sputum porduction, shortness of breath,wheezing improvement. no hemoptysis,  Gi: Denies swallowing difficulty, stomach pain, nausea or vomiting, diarrhea, constipation, bowel incontinence Gu:  Denies bladder incontinence, burning urine Ext:   Denies Joint pain, stiffness or swelling Skin: Denies  skin rash, easy bruising or bleeding or hives Endoc:  Denies polyuria, polydipsia , polyphagia or weight change Psych:   Denies depression, insomnia or hallucinations   Other:  All other systems negative   VS: BP 99/64   Pulse 61   Temp 97.7 F (36.5 C) (Oral)   Resp (!) 26   Ht 5\' 3"  (1.6 m)   Wt 74.8 kg   SpO2 96%   BMI 29.23 kg/m      PHYSICAL EXAM    GENERAL:NAD, no fevers, chills, no weakness no fatigue, appear brighter today, daugter in room with her HEAD: Normocephalic, atraumatic.  EYES: Pupils equal, round, reactive to light. Extraocular muscles intact. No scleral icterus.  MOUTH: Moist mucosal membrane. Dentition intact. No abscess noted.  EAR, NOSE, THROAT: Clear without exudates. No external lesions.  NECK: Supple. No thyromegaly. No nodules. No JVD. No  stridor PULMONARY: rare wheezes on  forced expiration no consolidation CARDIOVASCULAR: S1 and S2. Regular rate and rhythm. No murmurs, rubs, or gallops. No edema. Pedal pulses 2+ bilaterally.  GASTROINTESTINAL: Soft, nontender, nondistended. No masses. Positive bowel sounds. No hepatosplenomegaly.  MUSCULOSKELETAL: No swelling, clubbing, or edema. Range of motion full in all extremities.  NEUROLOGIC: Cranial nerves II through XII are intact. No gross focal neurological deficits. Sensation intact. Reflexes intact.  SKIN: No ulceration, lesions, rashes, or cyanosis. Skin warm and dry. Turgor intact.  PSYCHIATRIC: Mood, affect within normal limits. The patient is awake, alert and oriented x 3. Insight, judgment intact.       IMAGING    CT Angio Chest Pulmonary Embolism (PE) W or WO Contrast Result Date: 06/24/2023 CLINICAL DATA:  Load immediate probability for acute pulmonary embolism. Positive D-dimer. Fever. Dyspnea. EXAM: CT ANGIOGRAPHY CHEST WITH CONTRAST TECHNIQUE: Multidetector CT imaging of the chest was performed using the standard protocol during bolus administration of intravenous contrast. Multiplanar CT image reconstructions and MIPs were obtained to evaluate the vascular anatomy. RADIATION DOSE REDUCTION: This exam was performed according to the departmental dose-optimization program which includes automated exposure control, adjustment of the mA and/or kV according to patient size and/or use of iterative reconstruction technique. CONTRAST:  75mL OMNIPAQUE IOHEXOL 350 MG/ML SOLN COMPARISON:  03/25/2023 FINDINGS: Cardiovascular: No filling defects within the pulmonary arteries to suggest acute pulmonary embolism. Mediastinum/Nodes: No axillary or supraclavicular adenopathy. No mediastinal or hilar adenopathy. No pericardial fluid. Esophagus normal. Lungs/Pleura: Atelectasis in the RIGHT middle  lobe and lingula. Mild ground-glass densities suggest pulmonary edema. Evidence pneumonia. No  pulmonary infarction Upper Abdomen: Limited view of the liver, kidneys, pancreas are unremarkable. Normal adrenal glands. Musculoskeletal: No aggressive osseous lesion. Review of the MIP images confirms the above findings. IMPRESSION: 1. No evidence acute pulmonary embolism. 2. Mild pulmonary edema. 3. Atelectasis in the RIGHT middle lobe and lingula. Electronically Signed   By: Genevive Bi M.D.   On: 06/24/2023 12:27   DG Chest Portable 1 View Result Date: 06/23/2023 CLINICAL DATA:  COPD.  CHF. EXAM: PORTABLE CHEST 1 VIEW COMPARISON:  04/14/2023 FINDINGS: The heart size and mediastinal contours are within normal limits. Both lungs are clear. Status post right shoulder arthroplasty. IMPRESSION: No active disease. Electronically Signed   By: Signa Kell M.D.   On: 06/23/2023 06:38      ASSESSMENT/PLAN   Here with a copd flare. Does not appear failure is an issue ( hx of mod as, diastolic dysfunction). although chest ct should mild pulmonary edema. No signs of pneumonia. Presented with sirs. With hydration she may blossom. On rocephin and doxycycline. Her sat her better today  -agree with continuing the antibiotics, may be able to swith to po regimen for bronchitis, if pneumonia is not declared by then. Her d-dimer was up, chest ct did not show pulmonary embolism.  -wean fio2 down as tolerated -solumedrol, duo nebs, breo -dvt prophylaxis -cautious diuresis -following      Thank you for allowing me to participate in the care of this patient.   Patient/Family are satisfied with care plan and all questions have been answered.  This document was prepared using Dragon voice recognition software and may include unintentional dictation errors.     Ned Clines, M.D.  Division of Pulmonary & Critical Care Medicine  [facility]

## 2023-06-24 NOTE — ED Notes (Signed)
Pt walking with PT in halls at this time. Daughter at bedside.

## 2023-06-24 NOTE — ED Notes (Signed)
Patient taken to room 243.  150 mg of Vimpat was handed to Morton Amy, RN in patient's room.

## 2023-06-24 NOTE — TOC Initial Note (Signed)
Transition of Care [facility]) - Initial/Assessment Note    Patient Details  Name: [name] MRN: [id number] Date of Birth: 1950/12/03  Transition of Care [facility]) CM/SW Contact:    Marquita Palms, LCSW Phone Number: 06/24/2023, 1:35 PM  Clinical Narrative:                  CSW called son and lvm awaiting phone call. CSW will reach out to the interpreter for continued consult for patient.        Patient Goals and CMS Choice            Expected Discharge Plan and Services                                              Prior Living Arrangements/Services                       Activities of Daily Living      Permission Sought/Granted                  Emotional Assessment              Admission diagnosis:  Acute respiratory failure with hypoxia (HCC) [J96.01] Patient Active Problem List   Diagnosis Date Noted   Aortic stenosis 06/24/2023   Acute respiratory failure with hypoxia (HCC) 06/23/2023   Respiratory distress 06/23/2023   Systemic inflammatory response syndrome (HCC) 06/23/2023   DNR (do not resuscitate) 06/23/2023   B12 deficiency 04/14/2023   Pneumonia 03/25/2023   Community acquired pneumonia of left lower lobe of lung 01/19/2023   Hyperlipidemia associated with type 2 diabetes mellitus (HCC) 11/19/2022   Acute systolic heart failure (HCC) 04/03/2022   Convulsions, status epilepticus (HCC) 04/02/2022   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 with seizures of localized onset, not intractable, with status epilepticus (HCC)    Respiratory failure, acute-on-chronic (HCC) 03/30/2022   COPD with acute exacerbation (HCC) 12/04/2021   Shortness of breath 09/30/2021   Elevated troponin 09/30/2021   Stress reaction causing mixed disturbance of emotion and conduct 09/30/2021   CHF (congestive heart failure) (HCC) 05/01/2021   History of CVA (cerebrovascular accident) 08/05/2019   COPD  (chronic obstructive pulmonary disease) (HCC)    Hard of hearing    On home oxygen therapy    Liver cirrhosis secondary to NASH (HCC)    Asthma    Fall    Hypomagnesemia    Chronic diastolic heart failure (HCC) 08/26/2017   Tobacco use 08/26/2017   Acute on chronic diastolic CHF (congestive heart failure) (HCC) 08/15/2017   Seizure disorder (HCC) 06/15/2017   Hypoxia    Hypokalemia 02/12/2017   GERD (gastroesophageal reflux disease) 03/23/2016   Depression 03/23/2016   Controlled type 2 diabetes mellitus without complication, without long-term current use of insulin (HCC) 03/22/2016   Essential hypertension 03/22/2016   Neuropathy 03/22/2016   Seizure (HCC) 03/22/2016   COPD exacerbation (HCC) 03/31/2015   PCP:  [name], NP Pharmacy:   [facility] - [place], [place] - [address] [place] [address] Phone: [phone] Fax: [phone]  [facility] [address] [place] [address] Phone: [phone] Fax: [phone]  [facility] [address] [place] - [address] [place] [address] Phone: [phone] Fax:  [phone]     Social Drivers of Health (SDOH) Social History: SDOH Screenings   Food Insecurity: No Food Insecurity (03/25/2023)  Housing: Low Risk  (03/25/2023)  Transportation Needs: No Transportation Needs (03/25/2023)  Utilities: Not At Risk (03/25/2023)  Depression (PHQ2-9): High Risk (04/14/2023)  Financial Resource Strain: Low Risk  (03/24/2023)   Received from [facility]  Tobacco Use: High Risk (06/23/2023)   SDOH Interventions:     Readmission Risk Interventions    06/24/2023    1:33 PM 03/26/2023    4:14 PM  Readmission Risk Prevention Plan  Transportation Screening Complete Complete  PCP or Specialist Appt within 3-5 Days Complete Complete  HRI or Home Care Consult Complete Complete  Social Work Consult for  Recovery Care Planning/Counseling Complete Complete  Palliative Care Screening Complete Not Applicable  Medication Review Oceanographer) Complete Complete

## 2023-06-24 NOTE — ED Notes (Signed)
Informed RN bed assigned 

## 2023-06-24 NOTE — ED Notes (Signed)
3 50mg  tablets of Lacosamide taken from Johnna Acosta, RN by this RN to go with patient upstairs to room 243.

## 2023-06-24 NOTE — ED Notes (Signed)
PT at bedside.

## 2023-06-24 NOTE — Progress Notes (Addendum)
PROGRESS NOTE    [name]  MWN:[id number] DOB: Aug 17, 1950 DOA: 06/23/2023 PCP: [name], NP     Brief Narrative:   From admission h and p  [name] is a 72 y.o. female with medical history significant of CHF, Nash cirrhosis, COPD on chronic oxygen, hard of hearing, depression, diabetes, GERD, hypertension, rheumatoid arthritis, seizures.  She presents with shortness of breath chest pain cough wheezing and coughing up whitish sputum and some nausea and vomiting and some diarrhea.  Patient had respiratory distress and a fever in the emergency room.  COVID test pending.  Patient feeling better on BiPAP.  Nursing staff noticed a seizure lasting about 15 seconds and was a little bit altered.  When I came in the room she was talking to me coherently and did not recall that she had a seizure.  Hospitalist services contacted for further evaluation.  Admitting with systemic inflammatory response syndrome, respiratory distress and seizure.   Assessment & Plan:   Principal Problem:   Respiratory distress Active Problems:   Seizure disorder (HCC)   Acute respiratory failure with hypoxia (HCC)   COPD with acute exacerbation (HCC)   Acute on chronic diastolic CHF (congestive heart failure) (HCC)   Systemic inflammatory response syndrome (HCC)   Seizure (HCC)   Controlled type 2 diabetes mellitus without complication, without long-term current use of insulin (HCC)   Hypokalemia   Liver cirrhosis secondary to NASH (HCC)   Hard of hearing   Asthma   History of CVA (cerebrovascular accident)   DNR (do not resuscitate)   Aortic stenosis  # Fever Suspect 2/2 bacteremia, possible CAP - continue abx as below  # Bacteremia 1/2 BCs positive for strep pneumo, most likely source is lung given frequent lung infections - continue ceftriaxone - ID consult  # COPD exacerbation # CAP? One day dyspnea, also productive cough. Febrile on presentation. Has been treated for pneumonia  several times this year. CXR nothing focal - continue ceftriaxone/doxy - CT PE protocol to further characterize lung parenchyma and given dimer elevated - sputum for culture - continue steroids - pulm consulted and is following - home breo  # Acute on chronic hypoxic respiratory failure On 2 liters intermittently at home, here required bipap now weaned to 5 liters - continue [place] o2, wean as able  # Seizure disorder W/ breakthrough seizure here. Neuro consulted and has signed off - cont home vimpat  # Obesity Noted  # Deaf Reads lips well and declines use of deaf interpreter  # Aortic stenosis # HFpEF Pulm edema seen on CT - continue lasix 40 IV every day substituted for home 40 oral  # T2DM With hyperglycemia this morning - holding home meds - basal/bolus insulin  # Hx CVA - home plavix, statin  # GAD - home celexa  # Chronic pain - home gabapentin  # HTN BPs low normal in setting of infection - hold home losart, spiro  # Elevated troponin Denies chest pain, no overt ischemic changes on EKG - repeat trop   DVT prophylaxis: lovenox Code Status: dnr Family Communication: none at bedside, no answer when son called today  Level of care: Progressive Status is: Inpatient Remains inpatient appropriate because: severity of illness    Consultants:  Pulm, neuro, ID  Procedures: None thus far  Antimicrobials:  Ceftriaxone/doxy    Subjective: Reports ongoing cough but dyspnea improving, no chest pain  Objective: Vitals:   06/24/23 0330 06/24/23 0400 06/24/23 0613 06/24/23 0700  BP: (!) 92/59 (!) 99/52  118/69  Pulse: 63 62  62  Resp: (!) 22 (!) 22  (!) 27  Temp:   98.2 F (36.8 C)   TempSrc:   Oral   SpO2:  100%  97%  Weight:      Height:        Intake/Output Summary (Last 24 hours) at 06/24/2023 1005 Last data filed at 06/23/2023 1542 Gross per 24 hour  Intake 39.69 ml  Output --  Net 39.69 ml   Filed Weights   06/23/23 1425  Weight:  74.8 kg    Examination:  General exam: Appears calm and comfortable  Respiratory system: rhonchi throughout Cardiovascular system: S1 & S2 heard, RRR.   Gastrointestinal system: Abdomen is obese, nondistended, soft and nontender. Central nervous system: Alert and oriented. No focal neurological deficits. Extremities: Symmetric 5 x 5 power. Skin: No rashes, lesions or ulcers Psychiatry: Judgement and insight appear normal. Mood & affect appropriate.     Data Reviewed: I have personally reviewed following labs and imaging studies  CBC: Recent Labs  Lab 06/23/23 0415 06/24/23 0603  WBC 13.2* 15.0*  NEUTROABS 10.0*  --   HGB 14.4 12.3  HCT 43.9 37.5  MCV 85.6 84.7  PLT 248 203   Basic Metabolic Panel: Recent Labs  Lab 06/23/23 0415 06/23/23 0425 06/24/23 0603  NA 140  --  138  K 3.1*  --  3.7  CL 100  --  103  CO2 26  --  26  GLUCOSE 211*  --  234*  BUN 13  --  22  CREATININE 0.69  --  0.76  CALCIUM 8.9  --  8.5*  MG  --  1.3*  --    GFR: Estimated Creatinine Clearance: 62.5 mL/min (by C-G formula based on SCr of 0.76 mg/dL). Liver Function Tests: Recent Labs  Lab 06/23/23 0415  AST 26  ALT 14  ALKPHOS 89  BILITOT 0.6  PROT 6.9  ALBUMIN 3.7   No results for input(s): "LIPASE", "AMYLASE" in the last 168 hours. No results for input(s): "AMMONIA" in the last 168 hours. Coagulation Profile: Recent Labs  Lab 06/23/23 0415  INR 1.0   Cardiac Enzymes: Recent Labs  Lab 06/23/23 0425  CKTOTAL 50   BNP (last 3 results) No results for input(s): "PROBNP" in the last 8760 hours. HbA1C: No results for input(s): "HGBA1C" in the last 72 hours. CBG: Recent Labs  Lab 06/23/23 1149 06/23/23 1651 06/23/23 2118 06/24/23 0724  GLUCAP 332* 328* 228* 267*   Lipid Profile: No results for input(s): "CHOL", "HDL", "LDLCALC", "TRIG", "CHOLHDL", "LDLDIRECT" in the last 72 hours. Thyroid Function Tests: No results for input(s): "TSH", "T4TOTAL", "FREET4",  "T3FREE", "THYROIDAB" in the last 72 hours. Anemia Panel: No results for input(s): "VITAMINB12", "FOLATE", "FERRITIN", "TIBC", "IRON", "RETICCTPCT" in the last 72 hours. Urine analysis:    Component Value Date/Time   COLORURINE YELLOW (A) 03/26/2023 1108   APPEARANCEUR HAZY (A) 03/26/2023 1108   APPEARANCEUR Clear 11/14/2011 1607   LABSPEC 1.018 03/26/2023 1108   LABSPEC 1.006 11/14/2011 1607   PHURINE 6.0 03/26/2023 1108   GLUCOSEU NEGATIVE 03/26/2023 1108   GLUCOSEU Negative 11/14/2011 1607   HGBUR NEGATIVE 03/26/2023 1108   BILIRUBINUR NEGATIVE 03/26/2023 1108   BILIRUBINUR Negative 11/14/2011 1607   KETONESUR NEGATIVE 03/26/2023 1108   PROTEINUR NEGATIVE 03/26/2023 1108   NITRITE NEGATIVE 03/26/2023 1108   LEUKOCYTESUR NEGATIVE 03/26/2023 1108   LEUKOCYTESUR Negative 11/14/2011 1607   Sepsis Labs: @LABRCNTIP (procalcitonin:4,lacticidven:4)  )  Recent Results (from the past 240 hours)  Blood Culture (routine x 2)     Status: None (Preliminary result)   Collection Time: 06/23/23  4:25 AM   Specimen: BLOOD  Result Value Ref Range Status   Specimen Description BLOOD LEFT FOREARM  Final   Special Requests   Final    BOTTLES DRAWN AEROBIC AND ANAEROBIC Blood Culture results may not be optimal due to an inadequate volume of blood received in culture bottles   Culture   Final    NO GROWTH 1 DAY Performed at [facility], [address], [place], [place] [address]    Report Status PENDING  Incomplete  Blood Culture (routine x 2)     Status: None (Preliminary result)   Collection Time: 06/23/23  4:25 AM   Specimen: BLOOD  Result Value Ref Range Status   Specimen Description   Final    BLOOD LEFT ASSIST CONTROL Performed at [facility], [address], [place], [place] [address]    Special Requests   Final    BOTTLES DRAWN AEROBIC AND ANAEROBIC Blood Culture results may not be optimal due to an inadequate volume of blood received in culture  bottles Performed at [facility], [address] [place], [place] [address]    Culture  Setup Time   Final    GRAM POSITIVE COCCI ANAEROBIC BOTTLE ONLY CRITICAL RESULT CALLED TO, READ BACK BY AND VERIFIED WITH: ALEX CHAPPELL AT 2141 ON 06/23/23 BY SS Performed at [facility], [address] [place], [place] [address]    Culture PENDING  Incomplete   Report Status PENDING  Incomplete  Resp panel by RT-PCR (RSV, Flu A&B, Covid) Anterior Nasal Swab     Status: None   Collection Time: 06/23/23  4:25 AM   Specimen: Anterior Nasal Swab  Result Value Ref Range Status   SARS Coronavirus 2 by RT PCR NEGATIVE NEGATIVE Final    Comment: (NOTE) SARS-CoV-2 target nucleic acids are NOT DETECTED.  The SARS-CoV-2 RNA is generally detectable in upper respiratory specimens during the acute phase of infection. The lowest concentration of SARS-CoV-2 viral copies this assay can detect is 138 copies/mL. A negative result does not preclude SARS-Cov-2 infection and should not be used as the sole basis for treatment or other patient management decisions. A negative result may occur with  improper specimen collection/handling, submission of specimen other than nasopharyngeal swab, presence of viral mutation(s) within the areas targeted by this assay, and inadequate number of viral copies(<138 copies/mL). A negative result must be combined with clinical observations, patient history, and epidemiological information. The expected result is Negative.  Fact Sheet for Patients:  BloggerCourse.com  Fact Sheet for Healthcare Providers:  SeriousBroker.it  This test is no t yet approved or cleared by the [place] FDA and  has been authorized for detection and/or diagnosis of SARS-CoV-2 by FDA under an Emergency Use Authorization (EUA). This EUA will remain  in effect (meaning this test can be used) for the duration of the COVID-19  declaration under Section 564(b)(1) of the Act, 21 U.S.C.section 360bbb-3(b)(1), unless the authorization is terminated  or revoked sooner.       Influenza A by PCR NEGATIVE NEGATIVE Final   Influenza B by PCR NEGATIVE NEGATIVE Final    Comment: (NOTE) The Xpert Xpress SARS-CoV-2/FLU/RSV plus assay is intended as an aid in the diagnosis of influenza from Nasopharyngeal swab specimens and should not be used as a sole basis for treatment. Nasal  washings and aspirates are unacceptable for Xpert Xpress SARS-CoV-2/FLU/RSV testing.  Fact Sheet for Patients: BloggerCourse.com  Fact Sheet for Healthcare Providers: SeriousBroker.it  This test is not yet approved or cleared by the [place] FDA and has been authorized for detection and/or diagnosis of SARS-CoV-2 by FDA under an Emergency Use Authorization (EUA). This EUA will remain in effect (meaning this test can be used) for the duration of the COVID-19 declaration under Section 564(b)(1) of the Act, 21 U.S.C. section 360bbb-3(b)(1), unless the authorization is terminated or revoked.     Resp Syncytial Virus by PCR NEGATIVE NEGATIVE Final    Comment: (NOTE) Fact Sheet for Patients: BloggerCourse.com  Fact Sheet for Healthcare Providers: SeriousBroker.it  This test is not yet approved or cleared by the [place] FDA and has been authorized for detection and/or diagnosis of SARS-CoV-2 by FDA under an Emergency Use Authorization (EUA). This EUA will remain in effect (meaning this test can be used) for the duration of the COVID-19 declaration under Section 564(b)(1) of the Act, 21 U.S.C. section 360bbb-3(b)(1), unless the authorization is terminated or revoked.  Performed at [facility], [address] [place], [place] [address]   Blood Culture ID Panel (Reflexed)     Status: Abnormal   Collection Time: 06/23/23   4:25 AM  Result Value Ref Range Status   Enterococcus faecalis NOT DETECTED NOT DETECTED Final   Enterococcus Faecium NOT DETECTED NOT DETECTED Final   Listeria monocytogenes NOT DETECTED NOT DETECTED Final   Staphylococcus species NOT DETECTED NOT DETECTED Final   Staphylococcus aureus (BCID) NOT DETECTED NOT DETECTED Final   Staphylococcus epidermidis NOT DETECTED NOT DETECTED Final   Staphylococcus lugdunensis NOT DETECTED NOT DETECTED Final   Streptococcus species DETECTED (A) NOT DETECTED Final    Comment: CRITICAL RESULT CALLED TO, READ BACK BY AND VERIFIED WITH: ALEX CHAPPELL AT 2141 ON 06/23/23 BY SS    Streptococcus agalactiae NOT DETECTED NOT DETECTED Final   Streptococcus pneumoniae DETECTED (A) NOT DETECTED Final    Comment: CRITICAL RESULT CALLED TO, READ BACK BY AND VERIFIED WITH: ALEX CHAPPELL AT 2141 ON 06/23/23 BY SS    Streptococcus pyogenes NOT DETECTED NOT DETECTED Final   A.calcoaceticus-baumannii NOT DETECTED NOT DETECTED Final   Bacteroides fragilis NOT DETECTED NOT DETECTED Final   Enterobacterales NOT DETECTED NOT DETECTED Final   Enterobacter cloacae complex NOT DETECTED NOT DETECTED Final   Escherichia coli NOT DETECTED NOT DETECTED Final   Klebsiella aerogenes NOT DETECTED NOT DETECTED Final   Klebsiella oxytoca NOT DETECTED NOT DETECTED Final   Klebsiella pneumoniae NOT DETECTED NOT DETECTED Final   Proteus species NOT DETECTED NOT DETECTED Final   Salmonella species NOT DETECTED NOT DETECTED Final   Serratia marcescens NOT DETECTED NOT DETECTED Final   Haemophilus influenzae NOT DETECTED NOT DETECTED Final   Neisseria meningitidis NOT DETECTED NOT DETECTED Final   Pseudomonas aeruginosa NOT DETECTED NOT DETECTED Final   Stenotrophomonas maltophilia NOT DETECTED NOT DETECTED Final   Candida albicans NOT DETECTED NOT DETECTED Final   Candida auris NOT DETECTED NOT DETECTED Final   Candida glabrata NOT DETECTED NOT DETECTED Final   Candida krusei NOT  DETECTED NOT DETECTED Final   Candida parapsilosis NOT DETECTED NOT DETECTED Final   Candida tropicalis NOT DETECTED NOT DETECTED Final   Cryptococcus neoformans/gattii NOT DETECTED NOT DETECTED Final    Comment: Performed at [facility], [address] [place], [place] [address]  Urine Culture (for pregnant, neutropenic or urologic patients or patients  with an indwelling urinary catheter)     Status: Abnormal   Collection Time: 06/23/23  7:29 AM   Specimen: Urine, Clean Catch  Result Value Ref Range Status   Specimen Description   Final    URINE, CLEAN CATCH Performed at [facility], [address] [place], [place] [address]    Special Requests   Final    Normal Performed at [facility], [address] [place], [place] [address]    Culture MULTIPLE SPECIES PRESENT, SUGGEST RECOLLECTION (A)  Final   Report Status 06/24/2023 FINAL  Final  Respiratory (~20 pathogens) panel by PCR     Status: None   Collection Time: 06/23/23  8:49 AM   Specimen: Nasopharyngeal Swab; Respiratory  Result Value Ref Range Status   Adenovirus NOT DETECTED NOT DETECTED Final   Coronavirus 229E NOT DETECTED NOT DETECTED Final    Comment: (NOTE) The Coronavirus on the Respiratory Panel, DOES NOT test for the novel  Coronavirus (2019 nCoV)    Coronavirus HKU1 NOT DETECTED NOT DETECTED Final   Coronavirus NL63 NOT DETECTED NOT DETECTED Final   Coronavirus OC43 NOT DETECTED NOT DETECTED Final   Metapneumovirus NOT DETECTED NOT DETECTED Final   Rhinovirus / Enterovirus NOT DETECTED NOT DETECTED Final   Influenza A NOT DETECTED NOT DETECTED Final   Influenza B NOT DETECTED NOT DETECTED Final   Parainfluenza Virus 1 NOT DETECTED NOT DETECTED Final   Parainfluenza Virus 2 NOT DETECTED NOT DETECTED Final   Parainfluenza Virus 3 NOT DETECTED NOT DETECTED Final   Parainfluenza Virus 4 NOT DETECTED NOT DETECTED Final   Respiratory Syncytial Virus NOT DETECTED NOT DETECTED Final    Bordetella pertussis NOT DETECTED NOT DETECTED Final   Bordetella Parapertussis NOT DETECTED NOT DETECTED Final   Chlamydophila pneumoniae NOT DETECTED NOT DETECTED Final   Mycoplasma pneumoniae NOT DETECTED NOT DETECTED Final    Comment: Performed at [facility], [address], [place], [place] [address]         Radiology Studies: DG Chest Portable 1 View Result Date: 06/23/2023 CLINICAL DATA:  COPD.  CHF. EXAM: PORTABLE CHEST 1 VIEW COMPARISON:  04/14/2023 FINDINGS: The heart size and mediastinal contours are within normal limits. Both lungs are clear. Status post right shoulder arthroplasty. IMPRESSION: No active disease. Electronically Signed   By: Signa Kell M.D.   On: 06/23/2023 06:38        Scheduled Meds:  citalopram  10 mg Oral Daily   clopidogrel  75 mg Oral Daily   doxycycline  100 mg Oral Q12H   enoxaparin (LOVENOX) injection  40 mg Subcutaneous Q24H   fluticasone  1 spray Each Nare Daily   fluticasone furoate-vilanterol  1 puff Inhalation Daily   furosemide  40 mg Intravenous Daily   gabapentin  200 mg Oral QHS   insulin aspart  0-5 Units Subcutaneous QHS   insulin aspart  0-6 Units Subcutaneous TID WC   ipratropium-albuterol  3 mL Nebulization QID   lacosamide  150 mg Oral Q12H   losartan  100 mg Oral Daily   methylPREDNISolone (SOLU-MEDROL) injection  40 mg Intravenous Q12H   metoprolol succinate  25 mg Oral Daily   montelukast  10 mg Oral QHS   pantoprazole  40 mg Oral Daily   potassium chloride SA  20 mEq Oral Daily   rosuvastatin  10 mg Oral QHS   spironolactone  25 mg Oral q morning   Continuous Infusions:  cefTRIAXone (ROCEPHIN)  IV Stopped (  06/24/23 0745)     LOS: 1 day     Silvano Bilis, MD [facility]   If 7PM-7AM, please contact night-coverage [email or web address] Password [email or web address] 06/24/2023, 10:05 AM

## 2023-06-24 NOTE — Evaluation (Signed)
Physical Therapy Evaluation Patient Details Name: [name] MRN: [id number] DOB: 10-26-1950 Today's Date: 06/24/2023  History of Present Illness  [name] is a 72 y.o. female with medical history significant of CHF, Elita Boone cirrhosis, COPD on chronic oxygen, hard of hearing, depression, diabetes, GERD, hypertension, rheumatoid arthritis, seizures.  She presents with shortness of breath chest pain cough wheezing and coughing up whitish sputum and some nausea and vomiting and some diarrhea.  Patient had respiratory distress and a fever in the emergency room.  COVID test pending.  Patient feeling better on BiPAP.  Nursing staff noticed a seizure lasting about 15 seconds and was a little bit altered.  When I came in the room she was talking to me coherently and did not recall that she had a seizure.  Hospitalist services contacted for further evaluation.  Admitting with systemic inflammatory response syndrome, respiratory distress and seizure.  Clinical Impression  Patient received on stretcher, daughter at bedside. She is agreeable to PT assessment. Patient is deaf but reads lips very well. She does not need ASL interpreter. Patient is mod I for bed mobility. Transfers with cga, she is able to ambulate ~75 feet without AD and cga. Patient ambulated on room air and sats 87-94%. She does demonstrate sob and decreased activity tolerance. Patient reports heavy feeling in L LE and decreased sensation. RN notified. She will continue to benefit from skilled PT to improve safety, balance, endurance and independence.          If plan is discharge home, recommend the following: A little help with walking and/or transfers;A little help with bathing/dressing/bathroom;Assist for transportation;Help with stairs or ramp for entrance   Can travel by private vehicle    yes    Equipment Recommendations None recommended by PT  Recommendations for Other Services       Functional Status Assessment Patient  has had a recent decline in their functional status and demonstrates the ability to make significant improvements in function in a reasonable and predictable amount of time.     Precautions / Restrictions Precautions Precautions: Fall Restrictions Weight Bearing Restrictions Per Provider Order: No      Mobility  Bed Mobility Overal bed mobility: Modified Independent                  Transfers Overall transfer level: Needs assistance Equipment used: 1 person hand held assist Transfers: Sit to/from Stand Sit to Stand: Contact guard assist                Ambulation/Gait Ambulation/Gait assistance: Contact guard assist Gait Distance (Feet): 75 Feet Assistive device: None Gait Pattern/deviations: Antalgic, Step-through pattern Gait velocity: decr     General Gait Details: patient ambulated out in hallway, some limping noted and reports L LE feels heavy. Some unsteadiness with ambulation.  Stairs            Wheelchair Mobility     Tilt Bed    Modified Rankin (Stroke Patients Only)       Balance Overall balance assessment: Needs assistance, Mild deficits observed, not formally tested Sitting-balance support: Feet unsupported Sitting balance-Leahy Scale: Good     Standing balance support: No upper extremity supported, During functional activity Standing balance-Leahy Scale: Fair                               Pertinent Vitals/Pain Pain Assessment Pain Assessment: No/denies pain    Home Living Family/patient expects  to be discharged to:: Private residence Living Arrangements: Children (lives with son) Available Help at Discharge: Family;Available PRN/intermittently Type of Home: Apartment Home Access: Level entry       Home Layout: One level Home Equipment: Agricultural consultant (2 wheels);Rollator (4 wheels);Shower seat - built in;Wheelchair - manual;Cane - single point;Other (comment) Additional Comments: Pt lives with son who works  12-hour shifts during the day ~3x/week    Prior Function Prior Level of Function : Independent/Modified Independent             Mobility Comments: Patient/daughter report she does not use AD at baseline and wears O2 at night ADLs Comments: Independent with ADL/IADL management, driving, walks to mail box each day. Denies falls history.     Extremity/Trunk Assessment   Upper Extremity Assessment Upper Extremity Assessment: Defer to OT evaluation    Lower Extremity Assessment Lower Extremity Assessment: Overall WFL for tasks assessed;LLE deficits/detail LLE Deficits / Details: reports L LE feels heavy with ambulation. RN notified. Some limping noted with ambulation. LLE Sensation: decreased light touch    Cervical / Trunk Assessment Cervical / Trunk Assessment: Normal  Communication   Communication Communication: Hearing impairment;Other (comment) (patient is deaf, reads lips) Cueing Techniques: Verbal cues;Gestural cues  Cognition Arousal: Alert Behavior During Therapy: WFL for tasks assessed/performed Overall Cognitive Status: Within Functional Limits for tasks assessed                                          General Comments      Exercises     Assessment/Plan    PT Assessment Patient needs continued PT services  PT Problem List Decreased strength;Decreased activity tolerance;Decreased balance;Decreased mobility;Cardiopulmonary status limiting activity;Impaired sensation       PT Treatment Interventions DME instruction;Gait training;Stair training;Functional mobility training;Therapeutic activities;Therapeutic exercise;Balance training;Neuromuscular re-education;Cognitive remediation;Patient/family education    PT Goals (Current goals can be found in the Care Plan section)  Acute Rehab PT Goals Patient Stated Goal: return home with son PT Goal Formulation: With patient/family Time For Goal Achievement: 07/07/23 Potential to Achieve Goals:  Good    Frequency Min 1X/week     Co-evaluation PT/OT/SLP Co-Evaluation/Treatment: Yes Reason for Co-Treatment: For patient/therapist safety;To address functional/ADL transfers PT goals addressed during session: Mobility/safety with mobility;Balance         AM-PAC PT "6 Clicks" Mobility  Outcome Measure Help needed turning from your back to your side while in a flat bed without using bedrails?: A Little Help needed moving from lying on your back to sitting on the side of a flat bed without using bedrails?: A Little Help needed moving to and from a bed to a chair (including a wheelchair)?: A Little Help needed standing up from a chair using your arms (e.g., wheelchair or bedside chair)?: A Little Help needed to walk in hospital room?: A Little Help needed climbing 3-5 steps with a railing? : A Little 6 Click Score: 18    End of Session Equipment Utilized During Treatment: Oxygen Activity Tolerance: Other (comment);Patient limited by fatigue (mild SOB with ambulating, O2 sats remained >87 on room air.) Patient left: in bed;with call bell/phone within reach;with family/visitor present Nurse Communication: Mobility status PT Visit Diagnosis: Other abnormalities of gait and mobility (R26.89);Muscle weakness (generalized) (M62.81);Difficulty in walking, not elsewhere classified (R26.2);Unsteadiness on feet (R26.81)    Time: 2202-5427 PT Time Calculation (min) (ACUTE ONLY): 18 min   Charges:  PT Evaluation $PT Eval Moderate Complexity: 1 Mod   PT General Charges $$ ACUTE PT VISIT: 1 Visit         Kayton Ripp, PT, GCS 06/24/23,10:32 AM

## 2023-06-24 NOTE — Consult Note (Signed)
NAME: [name]  DOB: 08-19-1950  MRN: [id number]  Date/Time: 06/24/2023 1:11 PM  REQUESTING PROVIDER: Dr.Wouk Subjective:  REASON FOR CONSULT: Strep pneumo bacteremia ?History from daughter at bed side. Pt sedated after getting ativan  [name] is a 72 y.o. with a history of CHF, COPD, DM, NASH,RA, Sz, HTN Presented to the ED on 06/23/23 with sudden onset SOB, chest pain, cough  , N/V/D Vitals in the ED  06/23/23 04:14  BP 174/79 !  Temp 101.2 F (38.4 C) !  Pulse Rate 92  Resp 44 (H)  SpO2 91 %     Latest Reference Range & Units 06/23/23 04:15  WBC 4.0 - 10.5 K/uL 13.2 (H)  Hemoglobin 12.0 - 15.0 g/dL 13.0  HCT 86.5 - 78.4 % 43.9  Platelets 150 - 400 K/uL 248  Creatinine 0.44 - 1.00 mg/dL 6.96   Blood culture sent CTA chest showed no PE, atelectasis in rt middle lobe and lingula COVID , flu , RSV negative Started on ceftriaxone and doxy. I am seeing the patient as blood culture positive for strep pneumo Pt apparently had seizures for a brief period and was given ativan and she is sleeping now As per daughter no travel recently grandchild had RSV recently Past Medical History:  Diagnosis Date   Asthma    CHF (congestive heart failure) (HCC)    Cirrhosis, non-alcoholic (HCC)    COPD (chronic obstructive pulmonary disease) (HCC)    Deaf    Depression    Diabetes mellitus without complication (HCC)    GERD (gastroesophageal reflux disease)    Heart murmur    Hepatitis    History of rheumatic fever    History of scarlet fever    Hypertension    IBS (irritable bowel syndrome)    Lymph node disorder    arm   Neuropathy    On home oxygen therapy    hs   Orthopnea    Osteoarthritis    RA (rheumatoid arthritis) (HCC)    RLS (restless legs syndrome)    Seizures (HCC)    Shortness of breath dyspnea    Sleep apnea    Stroke (HCC)    tia    Past Surgical History:  Procedure Laterality Date   ABDOMINAL HYSTERECTOMY     BREAST BIOPSY Right  10/30/2020   Stereo Bx, X-clip,  BENIGN BREAST TISSUE WITH FIBROADENOMATOUS   CATARACT EXTRACTION W/PHACO Right 11/23/2014   Procedure: CATARACT EXTRACTION PHACO AND INTRAOCULAR LENS PLACEMENT (IOC);  Surgeon: Lia Hopping, MD;  Location: [facility];  Service: Ophthalmology;  Laterality: Right;   CATARACT EXTRACTION W/PHACO Left 12/14/2014   Procedure: CATARACT EXTRACTION PHACO AND INTRAOCULAR LENS PLACEMENT (IOC);  Surgeon: Lia Hopping, MD;  Location: [facility];  Service: Ophthalmology;  Laterality: Left;  US:01:16.6 AP:15.8 CDE:12.14   CESAREAN SECTION     CHOLECYSTECTOMY     COLONOSCOPY WITH PROPOFOL N/A 10/08/2020   Procedure: COLONOSCOPY WITH PROPOFOL;  Surgeon: Toledo, Boykin Nearing, MD;  Location: [facility];  Service: Gastroenterology;  Laterality: N/A;   ESOPHAGOGASTRODUODENOSCOPY (EGD) WITH PROPOFOL N/A 10/08/2020   Procedure: ESOPHAGOGASTRODUODENOSCOPY (EGD) WITH PROPOFOL;  Surgeon: Toledo, Boykin Nearing, MD;  Location: [facility];  Service: Gastroenterology;  Laterality: N/A;  DM DEAF, NEEDS SIGN INTERPRETER PER SON   REVERSE SHOULDER ARTHROPLASTY Right 12/21/2019   Procedure: REVERSE SHOULDER ARTHROPLASTY;  Surgeon: Lyndle Herrlich, MD;  Location: [facility];  Service: Orthopedics;  Laterality: Right;   THUMB ARTHROSCOPY     TONSILLECTOMY  TYMPANOPLASTY     muliple    Social History   Socioeconomic History   Marital status: Widowed    Spouse name: Not on file   Number of children: Not on file   Years of education: Not on file   Highest education level: Not on file  Occupational History   Not on file  Tobacco Use   Smoking status: Every Day    Current packs/day: 0.50    Average packs/day: 0.5 packs/day for 55.3 years (27.7 ttl pk-yrs)    Types: Cigarettes    Start date: 03/01/1968   Smokeless tobacco: Never  Vaping Use   Vaping status: Former  Substance and Sexual Activity   Alcohol use: No   Drug use: No   Sexual activity: Not Currently  Other Topics  Concern   Not on file  Social History Narrative   Pt lives in 1 story home with her son, his girlfriend and a family friend   Has 2 adult children   9th grade education   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    Social Drivers of Corporate investment banker Strain: Low Risk  (03/24/2023)   Received from [facility]   Overall Financial Resource Strain (CARDIA)    Difficulty of Paying Living Expenses: Not hard at all  Food Insecurity: No Food Insecurity (03/25/2023)   Hunger Vital Sign    Worried About Running Out of Food in the Last Year: Never true    Ran Out of Food in the Last Year: Never true  Transportation Needs: No Transportation Needs (03/25/2023)   PRAPARE - Administrator, Civil Service (Medical): No    Lack of Transportation (Non-Medical): No  Physical Activity: Not on file  Stress: Not on file  Social Connections: Not on file  Intimate Partner Violence: Not At Risk (03/25/2023)   Humiliation, Afraid, Rape, and Kick questionnaire    Fear of Current or Ex-Partner: No    Emotionally Abused: No    Physically Abused: No    Sexually Abused: No    Family History  Problem Relation Age of Onset   Lung cancer Mother    CAD Father    Miscarriages / Stillbirths Sister    Hyperlipidemia Sister    Hypertension Sister    Diabetes Sister    COPD Sister    Asthma Sister    Arthritis Sister    Breast cancer Sister    Heart attack Sister    Hyperlipidemia Sister    Heart disease Sister    Heart attack Sister    Diabetes Sister    Cancer Sister    Asthma Sister    Heart disease Brother    Heart attack Brother    Depression Brother    Hypertension Son    Hyperlipidemia Son    Heart disease Son    Depression Son    Hypertension Daughter    Depression Daughter    Asthma Daughter    Birth defects Paternal Uncle    Hearing loss Brother    Allergies  Allergen Reactions   Celebrex [Celecoxib] Itching    itching    Ciprofloxacin Itching   Codeine Itching   Fosphenytoin Itching and Other (See Comments)   Levaquin [Levofloxacin In D5w] Itching   Levofloxacin Itching   Lovastatin Itching   Pravastatin Itching   Sulfa Antibiotics Itching   Aspirin Itching and Rash   Azithromycin Rash   Penicillins Rash  Documentation indicates severe reaction  Pt tolerated cephalosporin without adverse reaction 09/18    I? Current Facility-Administered Medications  Medication Dose Route Frequency Provider Last Rate Last Admin   cefTRIAXone (ROCEPHIN) 2 g in sodium chloride 0.9 % 100 mL IVPB  2 g Intravenous Q24H Alford Highland, MD   Stopped at 06/24/23 0745   citalopram (CELEXA) tablet 10 mg  10 mg Oral Daily Alford Highland, MD   10 mg at 06/24/23 0959   clopidogrel (PLAVIX) tablet 75 mg  75 mg Oral Daily Alford Highland, MD   75 mg at 06/24/23 0959   doxycycline (VIBRA-TABS) tablet 100 mg  100 mg Oral Q12H Wieting, Richard, MD   100 mg at 06/24/23 0959   enoxaparin (LOVENOX) injection 40 mg  40 mg Subcutaneous Q24H Alford Highland, MD   40 mg at 06/23/23 2120   fluticasone (FLONASE) 50 MCG/ACT nasal spray 1 spray  1 spray Each Nare Daily Wieting, Richard, MD       fluticasone furoate-vilanterol (BREO ELLIPTA) 100-25 MCG/ACT 1 puff  1 puff Inhalation Daily Wieting, Richard, MD       furosemide (LASIX) injection 40 mg  40 mg Intravenous Daily Alford Highland, MD   40 mg at 06/24/23 0959   gabapentin (NEURONTIN) capsule 200 mg  200 mg Oral QHS Wieting, Richard, MD   200 mg at 06/23/23 2120   insulin aspart (novoLOG) injection 0-5 Units  0-5 Units Subcutaneous QHS Alford Highland, MD   2 Units at 06/23/23 2128   insulin aspart (novoLOG) injection 0-6 Units  0-6 Units Subcutaneous TID WC Alford Highland, MD   2 Units at 06/24/23 1225   insulin glargine-yfgn (SEMGLEE) injection 10 Units  10 Units Subcutaneous Daily Kathrynn Running, MD   10 Units at 06/24/23 1226   ipratropium-albuterol (DUONEB) 0.5-2.5 (3)  MG/3ML nebulizer solution 3 mL  3 mL Nebulization QID Alford Highland, MD   3 mL at 06/24/23 1227   lacosamide (VIMPAT) tablet 150 mg  150 mg Oral Q12H Wieting, Richard, MD   150 mg at 06/24/23 1000   LORazepam (ATIVAN) injection 1 mg  1 mg Intravenous Q2H PRN Bhagat, Srishti L, MD   1 mg at 06/24/23 1017   methylPREDNISolone sodium succinate (SOLU-MEDROL) 40 mg/mL injection 40 mg  40 mg Intravenous Q12H Alford Highland, MD   40 mg at 06/24/23 0959   metoprolol succinate (TOPROL-XL) 24 hr tablet 25 mg  25 mg Oral Daily Alford Highland, MD   25 mg at 06/24/23 0959   montelukast (SINGULAIR) tablet 10 mg  10 mg Oral QHS Wieting, Richard, MD   10 mg at 06/23/23 2120   ondansetron (ZOFRAN) tablet 4 mg  4 mg Oral Q6H PRN Alford Highland, MD       Or   ondansetron (ZOFRAN) injection 4 mg  4 mg Intravenous Q6H PRN Wieting, Richard, MD       pantoprazole (PROTONIX) EC tablet 40 mg  40 mg Oral Daily Alford Highland, MD   40 mg at 06/24/23 0959   potassium chloride SA (KLOR-CON M) CR tablet 20 mEq  20 mEq Oral Daily Alford Highland, MD   20 mEq at 06/24/23 0959   rosuvastatin (CRESTOR) tablet 10 mg  10 mg Oral QHS Wieting, Richard, MD   10 mg at 06/23/23 2120   Current Outpatient Medications  Medication Sig Dispense Refill   citalopram (CELEXA) 10 MG tablet TAKE 1 TABLET BY MOUTH DAILY 90 tablet 3   clopidogrel (PLAVIX) 75 MG tablet TAKE 1  TABLET BY MOUTH DAILY 90 tablet 1   fluticasone (FLONASE) 50 MCG/ACT nasal spray Place 1 spray into both nostrils 2 (two) times daily.     fluticasone furoate-vilanterol (BREO ELLIPTA) 100-25 MCG/ACT AEPB Inhale 1 puff into the lungs daily.     furosemide (LASIX) 40 MG tablet Take 1 tablet (40 mg total) by mouth daily. 90 tablet 3   gabapentin (NEURONTIN) 100 MG capsule Take 2 capsules (200 mg total) by mouth at bedtime. 180 capsule 3   glimepiride (AMARYL) 2 MG tablet TAKE 1 TABLET BY MOUTH DAILY FOR DIABETES 90 tablet 3   ipratropium-albuterol (DUONEB) 0.5-2.5  (3) MG/3ML SOLN INHALE 1 VIAL USING NEBULIZER EVERY SIX HOURS AS NEEDED FOR SHORTNESS OF BREATH 90 mL 1   Lacosamide (VIMPAT) 150 MG TABS Take 150 mg by mouth every 12 (twelve) hours.     losartan (COZAAR) 100 MG tablet TAKE 1 TABLET BY MOUTH DAILY AS DIRECTED 90 tablet 3   metoprolol succinate (TOPROL-XL) 25 MG 24 hr tablet TAKE 1 TABLET BY MOUTH DAILY 90 tablet 3   montelukast (SINGULAIR) 10 MG tablet TAKE 1 TABLET BY MOUTH AT BEDTIME 90 tablet 3   pantoprazole (PROTONIX) 40 MG tablet TAKE 1 TABLET BY MOUTH DAILY FOR REFLUX/HEARTBURN 90 tablet 3   potassium chloride (KLOR-CON) 10 MEQ tablet TAKE 2 TABLETS BY MOUTH DAILY 180 tablet 3   rosuvastatin (CRESTOR) 10 MG tablet TAKE 1 TABLET BY MOUTH AT BEDTIME 90 tablet 3   spironolactone (ALDACTONE) 25 MG tablet TAKE 1 TABLET BY MOUTH EVERY MORNING 90 tablet 0   isosorbide mononitrate (IMDUR) 30 MG 24 hr tablet Take 30 mg by mouth daily. (Patient not taking: Reported on 06/23/2023)     rOPINIRole (REQUIP) 0.5 MG tablet Take 1 tablet (0.5 mg total) by mouth at bedtime. (Patient not taking: Reported on 06/23/2023) 30 tablet 0   traMADol (ULTRAM) 50 MG tablet Take 1 tablet (50 mg total) by mouth every 12 (twelve) hours as needed for moderate pain or severe pain. (Patient not taking: Reported on 06/23/2023) 15 tablet 0     Abtx:  Anti-infectives (From admission, onward)    Start     Dose/Rate Route Frequency Ordered Stop   06/24/23 0500  cefTRIAXone (ROCEPHIN) 2 g in sodium chloride 0.9 % 100 mL IVPB        2 g 200 mL/hr over 30 Minutes Intravenous Every 24 hours 06/23/23 0753     06/23/23 2200  doxycycline (VIBRA-TABS) tablet 100 mg        100 mg Oral Every 12 hours 06/23/23 0753     06/23/23 0545  cefTRIAXone (ROCEPHIN) 2 g in sodium chloride 0.9 % 100 mL IVPB        2 g 200 mL/hr over 30 Minutes Intravenous  Once 06/23/23 0541 06/23/23 0700   06/23/23 0545  azithromycin (ZITHROMAX) 500 mg in sodium chloride 0.9 % 250 mL IVPB        500 mg 250  mL/hr over 60 Minutes Intravenous  Once 06/23/23 0541 06/23/23 0730       REVIEW OF SYSTEMS:  NA: Objective:  VITALS:  BP (!) 111/55   Pulse 72   Temp 97.7 F (36.5 C) (Oral)   Resp (!) 25   Ht 5\' 3"  (1.6 m)   Wt 74.8 kg   SpO2 94%   BMI 29.23 kg/m   General: somnolent Head: Normocephalic, without obvious abnormality, atraumatic. Eyes: did not examine ENT not examined Neck:  symmetrical, no adenopathy, thyroid:  non tender no carotid bruit and no JVD. Back:b/l air entry , few rhonchi Heart: Regular rate and rhythm, no murmur, rub or gallop. Abdomen: Soft, non-tender,not distended. Bowel sounds normal. No masses Extremities: atraumatic, no cyanosis. No edema. No clubbing Skin: No rashes or lesions. Or bruising Lymph: Cervical, supraclavicular normal. Neurologic: did not assess  Pertinent Labs Lab Results CBC    Component Value Date/Time   WBC 15.0 (H) 06/24/2023 0603   RBC 4.43 06/24/2023 0603   HGB 12.3 06/24/2023 0603   HGB 15.3 11/05/2013 1138   HCT 37.5 06/24/2023 0603   HCT 44.8 11/05/2013 1138   PLT 203 06/24/2023 0603   PLT 214 11/05/2013 1138   MCV 84.7 06/24/2023 0603   MCV 85 11/05/2013 1138   MCH 27.8 06/24/2023 0603   MCHC 32.8 06/24/2023 0603   RDW 13.8 06/24/2023 0603   RDW 13.3 11/05/2013 1138   LYMPHSABS 2.3 06/23/2023 0415   LYMPHSABS 1.4 04/24/2013 0614   MONOABS 0.8 06/23/2023 0415   MONOABS 0.2 04/24/2013 0614   EOSABS 0.1 06/23/2023 0415   EOSABS 0.0 04/24/2013 0614   BASOSABS 0.1 06/23/2023 0415   BASOSABS 0.0 04/24/2013 0614       Latest Ref Rng & Units 06/24/2023    6:03 AM 06/23/2023    4:15 AM 03/28/2023    5:48 AM  CMP  Glucose 70 - 99 mg/dL 130  865  58   BUN 8 - 23 mg/dL 22  13  20    Creatinine 0.44 - 1.00 mg/dL 7.84  6.96  2.95   Sodium 135 - 145 mmol/L 138  140  141   Potassium 3.5 - 5.1 mmol/L 3.7  3.1  3.3   Chloride 98 - 111 mmol/L 103  100  107   CO2 22 - 32 mmol/L 26  26  26    Calcium 8.9 - 10.3 mg/dL 8.5  8.9   8.7   Total Protein 6.5 - 8.1 g/dL  6.9    Total Bilirubin <1.2 mg/dL  0.6    Alkaline Phos 38 - 126 U/L  89    AST 15 - 41 U/L  26    ALT 0 - 44 U/L  14        Microbiology: Recent Results (from the past 240 hours)  Blood Culture (routine x 2)     Status: None (Preliminary result)   Collection Time: 06/23/23  4:25 AM   Specimen: BLOOD  Result Value Ref Range Status   Specimen Description BLOOD LEFT FOREARM  Final   Special Requests   Final    BOTTLES DRAWN AEROBIC AND ANAEROBIC Blood Culture results may not be optimal due to an inadequate volume of blood received in culture bottles   Culture   Final    NO GROWTH 1 DAY Performed at [facility], [address] [place], [place] [address]    Report Status PENDING  Incomplete  Blood Culture (routine x 2)     Status: None (Preliminary result)   Collection Time: 06/23/23  4:25 AM   Specimen: BLOOD  Result Value Ref Range Status   Specimen Description   Final    BLOOD LEFT ASSIST CONTROL Performed at [facility], [address] [place], [place] [address]    Special Requests   Final    BOTTLES DRAWN AEROBIC AND ANAEROBIC Blood Culture results may not be optimal due to an inadequate volume of blood received in culture bottles Performed at [facility], [address] [place], [place] [address]    Culture  Setup Time   Final    GRAM POSITIVE COCCI ANAEROBIC BOTTLE ONLY CRITICAL RESULT CALLED TO, READ BACK BY AND VERIFIED WITH: ALEX CHAPPELL AT 2141 ON 06/23/23 BY SS Performed at [facility], [address] [place], [place] [address]    Culture PENDING  Incomplete   Report Status PENDING  Incomplete  Resp panel by RT-PCR (RSV, Flu A&B, Covid) Anterior Nasal Swab     Status: None   Collection Time: 06/23/23  4:25 AM   Specimen: Anterior Nasal Swab  Result Value Ref Range Status   SARS Coronavirus 2 by RT PCR NEGATIVE NEGATIVE Final    Comment: (NOTE) SARS-CoV-2 target nucleic acids  are NOT DETECTED.  The SARS-CoV-2 RNA is generally detectable in upper respiratory specimens during the acute phase of infection. The lowest concentration of SARS-CoV-2 viral copies this assay can detect is 138 copies/mL. A negative result does not preclude SARS-Cov-2 infection and should not be used as the sole basis for treatment or other patient management decisions. A negative result may occur with  improper specimen collection/handling, submission of specimen other than nasopharyngeal swab, presence of viral mutation(s) within the areas targeted by this assay, and inadequate number of viral copies(<138 copies/mL). A negative result must be combined with clinical observations, patient history, and epidemiological information. The expected result is Negative.  Fact Sheet for Patients:  BloggerCourse.com  Fact Sheet for Healthcare Providers:  SeriousBroker.it  This test is no t yet approved or cleared by the [place] FDA and  has been authorized for detection and/or diagnosis of SARS-CoV-2 by FDA under an Emergency Use Authorization (EUA). This EUA will remain  in effect (meaning this test can be used) for the duration of the COVID-19 declaration under Section 564(b)(1) of the Act, 21 U.S.C.section 360bbb-3(b)(1), unless the authorization is terminated  or revoked sooner.       Influenza A by PCR NEGATIVE NEGATIVE Final   Influenza B by PCR NEGATIVE NEGATIVE Final    Comment: (NOTE) The Xpert Xpress SARS-CoV-2/FLU/RSV plus assay is intended as an aid in the diagnosis of influenza from Nasopharyngeal swab specimens and should not be used as a sole basis for treatment. Nasal washings and aspirates are unacceptable for Xpert Xpress SARS-CoV-2/FLU/RSV testing.  Fact Sheet for Patients: BloggerCourse.com  Fact Sheet for Healthcare Providers: SeriousBroker.it  This test is  not yet approved or cleared by the [place] FDA and has been authorized for detection and/or diagnosis of SARS-CoV-2 by FDA under an Emergency Use Authorization (EUA). This EUA will remain in effect (meaning this test can be used) for the duration of the COVID-19 declaration under Section 564(b)(1) of the Act, 21 U.S.C. section 360bbb-3(b)(1), unless the authorization is terminated or revoked.     Resp Syncytial Virus by PCR NEGATIVE NEGATIVE Final    Comment: (NOTE) Fact Sheet for Patients: BloggerCourse.com  Fact Sheet for Healthcare Providers: SeriousBroker.it  This test is not yet approved or cleared by the [place] FDA and has been authorized for detection and/or diagnosis of SARS-CoV-2 by FDA under an Emergency Use Authorization (EUA). This EUA will remain in effect (meaning this test can be used) for the duration of the COVID-19 declaration under Section 564(b)(1) of the Act, 21 U.S.C. section 360bbb-3(b)(1), unless the authorization is terminated or revoked.  Performed at [facility], [address] [place], [place] [address]   Blood Culture ID Panel (Reflexed)     Status: Abnormal  Collection Time: 06/23/23  4:25 AM  Result Value Ref Range Status   Enterococcus faecalis NOT DETECTED NOT DETECTED Final   Enterococcus Faecium NOT DETECTED NOT DETECTED Final   Listeria monocytogenes NOT DETECTED NOT DETECTED Final   Staphylococcus species NOT DETECTED NOT DETECTED Final   Staphylococcus aureus (BCID) NOT DETECTED NOT DETECTED Final   Staphylococcus epidermidis NOT DETECTED NOT DETECTED Final   Staphylococcus lugdunensis NOT DETECTED NOT DETECTED Final   Streptococcus species DETECTED (A) NOT DETECTED Final    Comment: CRITICAL RESULT CALLED TO, READ BACK BY AND VERIFIED WITH: ALEX CHAPPELL AT 2141 ON 06/23/23 BY SS    Streptococcus agalactiae NOT DETECTED NOT DETECTED Final   Streptococcus  pneumoniae DETECTED (A) NOT DETECTED Final    Comment: CRITICAL RESULT CALLED TO, READ BACK BY AND VERIFIED WITH: ALEX CHAPPELL AT 2141 ON 06/23/23 BY SS    Streptococcus pyogenes NOT DETECTED NOT DETECTED Final   A.calcoaceticus-baumannii NOT DETECTED NOT DETECTED Final   Bacteroides fragilis NOT DETECTED NOT DETECTED Final   Enterobacterales NOT DETECTED NOT DETECTED Final   Enterobacter cloacae complex NOT DETECTED NOT DETECTED Final   Escherichia coli NOT DETECTED NOT DETECTED Final   Klebsiella aerogenes NOT DETECTED NOT DETECTED Final   Klebsiella oxytoca NOT DETECTED NOT DETECTED Final   Klebsiella pneumoniae NOT DETECTED NOT DETECTED Final   Proteus species NOT DETECTED NOT DETECTED Final   Salmonella species NOT DETECTED NOT DETECTED Final   Serratia marcescens NOT DETECTED NOT DETECTED Final   Haemophilus influenzae NOT DETECTED NOT DETECTED Final   Neisseria meningitidis NOT DETECTED NOT DETECTED Final   Pseudomonas aeruginosa NOT DETECTED NOT DETECTED Final   Stenotrophomonas maltophilia NOT DETECTED NOT DETECTED Final   Candida albicans NOT DETECTED NOT DETECTED Final   Candida auris NOT DETECTED NOT DETECTED Final   Candida glabrata NOT DETECTED NOT DETECTED Final   Candida krusei NOT DETECTED NOT DETECTED Final   Candida parapsilosis NOT DETECTED NOT DETECTED Final   Candida tropicalis NOT DETECTED NOT DETECTED Final   Cryptococcus neoformans/gattii NOT DETECTED NOT DETECTED Final    Comment: Performed at [facility], [address], [place], [place] [address]  Urine Culture (for pregnant, neutropenic or urologic patients or patients with an indwelling urinary catheter)     Status: Abnormal   Collection Time: 06/23/23  7:29 AM   Specimen: Urine, Clean Catch  Result Value Ref Range Status   Specimen Description   Final    URINE, CLEAN CATCH Performed at [facility], [address] [place], [place] [address]    Special Requests   Final     Normal Performed at [facility], [address] [place], [place] [address]    Culture MULTIPLE SPECIES PRESENT, SUGGEST RECOLLECTION (A)  Final   Report Status 06/24/2023 FINAL  Final  Respiratory (~20 pathogens) panel by PCR     Status: None   Collection Time: 06/23/23  8:49 AM   Specimen: Nasopharyngeal Swab; Respiratory  Result Value Ref Range Status   Adenovirus NOT DETECTED NOT DETECTED Final   Coronavirus 229E NOT DETECTED NOT DETECTED Final    Comment: (NOTE) The Coronavirus on the Respiratory Panel, DOES NOT test for the novel  Coronavirus (2019 nCoV)    Coronavirus HKU1 NOT DETECTED NOT DETECTED Final   Coronavirus NL63 NOT DETECTED NOT DETECTED Final   Coronavirus OC43 NOT DETECTED NOT DETECTED Final   Metapneumovirus NOT DETECTED NOT DETECTED Final   Rhinovirus / Enterovirus NOT DETECTED NOT DETECTED  Final   Influenza A NOT DETECTED NOT DETECTED Final   Influenza B NOT DETECTED NOT DETECTED Final   Parainfluenza Virus 1 NOT DETECTED NOT DETECTED Final   Parainfluenza Virus 2 NOT DETECTED NOT DETECTED Final   Parainfluenza Virus 3 NOT DETECTED NOT DETECTED Final   Parainfluenza Virus 4 NOT DETECTED NOT DETECTED Final   Respiratory Syncytial Virus NOT DETECTED NOT DETECTED Final   Bordetella pertussis NOT DETECTED NOT DETECTED Final   Bordetella Parapertussis NOT DETECTED NOT DETECTED Final   Chlamydophila pneumoniae NOT DETECTED NOT DETECTED Final   Mycoplasma pneumoniae NOT DETECTED NOT DETECTED Final    Comment: Performed at [facility], [address] [place], [place] [address]    IMAGING RESULTS:  I have personally reviewed the films. CT shows atelectasis of Rt Middle lobe ?   Impression /Recommendation  Strep pneumo bacteremia Source likely to be the lung or upper resp tract Currently on ceftriaxone and Doxy- DC the latter  COPD exacerbation- started solumedrol/nebs  CHF - started lasix  Sz disorder- received a dose of  ativan     ?_________________________________ Discussed with daughter in detail Note:  This document was prepared using Dragon voice recognition software and may include unintentional dictation errors.

## 2023-06-24 NOTE — ED Notes (Signed)
Pt c/o of HA, MD aware, no PRNs in [facility] at this time.   Meal given to pt.

## 2023-06-24 NOTE — ED Notes (Signed)
Pt had a seizure while this RN was in room, PRN ativan given, vitals checked and stable, pt awake after 1 min, pt A&Ox4 at this time.

## 2023-06-25 DIAGNOSIS — R7881 Bacteremia: Secondary | ICD-10-CM

## 2023-06-25 DIAGNOSIS — R0603 Acute respiratory distress: Secondary | ICD-10-CM | POA: Diagnosis not present

## 2023-06-25 DIAGNOSIS — B953 Streptococcus pneumoniae as the cause of diseases classified elsewhere: Secondary | ICD-10-CM | POA: Diagnosis not present

## 2023-06-25 DIAGNOSIS — G40919 Epilepsy, unspecified, intractable, without status epilepticus: Secondary | ICD-10-CM

## 2023-06-25 DIAGNOSIS — J441 Chronic obstructive pulmonary disease with (acute) exacerbation: Secondary | ICD-10-CM | POA: Diagnosis not present

## 2023-06-25 LAB — BASIC METABOLIC PANEL
Anion gap: 11 (ref 5–15)
BUN: 21 mg/dL (ref 8–23)
CO2: 25 mmol/L (ref 22–32)
Calcium: 8.6 mg/dL — ABNORMAL LOW (ref 8.9–10.3)
Chloride: 102 mmol/L (ref 98–111)
Creatinine, Ser: 0.77 mg/dL (ref 0.44–1.00)
GFR, Estimated: >60 mL/min (ref >60)
Glucose, Bld: 231 mg/dL — ABNORMAL HIGH (ref 70–99)
Potassium: 3.8 mmol/L (ref 3.5–5.1)
Sodium: 138 mmol/L (ref 135–145)

## 2023-06-25 LAB — GLUCOSE, CAPILLARY
Glucose-Capillary: 250 mg/dL — ABNORMAL HIGH (ref 70–99)
Glucose-Capillary: 438 mg/dL — ABNORMAL HIGH (ref 70–99)
Glucose-Capillary: 298 mg/dL — ABNORMAL HIGH (ref 70–99)
Glucose-Capillary: 131 mg/dL — ABNORMAL HIGH (ref 70–99)

## 2023-06-25 LAB — MAGNESIUM: Magnesium: 2 mg/dL (ref 1.7–2.4)

## 2023-06-25 MED ORDER — INSULIN GLARGINE-YFGN 100 UNIT/ML ~~LOC~~ SOLN
15.0000 [IU] | Freq: Every day | SUBCUTANEOUS | Status: DC
  Administered 2023-06-26: 15 [IU] via SUBCUTANEOUS
  Filled 2023-06-25: qty 0.15

## 2023-06-25 MED ORDER — INSULIN GLARGINE-YFGN 100 UNIT/ML ~~LOC~~ SOLN: 15.0000 [IU] | Freq: Two times a day (BID) | SUBCUTANEOUS | Status: DC

## 2023-06-25 MED ORDER — INSULIN ASPART 100 UNIT/ML IJ SOLN
0.0000 [IU] | Freq: Three times a day (TID) | INTRAMUSCULAR | Status: DC
  Administered 2023-06-25: 11 [IU] via SUBCUTANEOUS
  Administered 2023-06-26: 3 [IU] via SUBCUTANEOUS
  Administered 2023-06-26: 7 [IU] via SUBCUTANEOUS
  Administered 2023-06-26: 3 [IU] via SUBCUTANEOUS
  Filled 2023-06-25 (×4): qty 1

## 2023-06-25 MED ORDER — PREDNISONE 20 MG PO TABS
40.0000 mg | ORAL_TABLET | Freq: Every day | ORAL | Status: DC
  Administered 2023-06-26: 40 mg via ORAL
  Filled 2023-06-25: qty 2

## 2023-06-25 MED ORDER — INSULIN ASPART 100 UNIT/ML IJ SOLN
0.0000 [IU] | Freq: Every day | INTRAMUSCULAR | Status: DC
Start: 2023-06-25 — End: 2023-06-27

## 2023-06-25 MED ORDER — LACOSAMIDE 50 MG PO TABS
50.0000 mg | ORAL_TABLET | Freq: Once | ORAL | Status: AC
  Administered 2023-06-25: 50 mg via ORAL
  Filled 2023-06-25: qty 1

## 2023-06-25 MED ORDER — INSULIN GLARGINE-YFGN 100 UNIT/ML ~~LOC~~ SOLN: 10.0000 [IU] | Freq: Once | SUBCUTANEOUS | Status: DC

## 2023-06-25 MED ORDER — INSULIN GLARGINE-YFGN 100 UNIT/ML ~~LOC~~ SOLN
15.0000 [IU] | Freq: Every day | SUBCUTANEOUS | Status: DC
Start: 2023-06-25 — End: 2023-06-25
  Filled 2023-06-25: qty 0.15

## 2023-06-25 MED ORDER — LACOSAMIDE 50 MG PO TABS
200.0000 mg | ORAL_TABLET | Freq: Two times a day (BID) | ORAL | Status: DC
  Administered 2023-06-25 – 2023-06-26 (×2): 200 mg via ORAL
  Filled 2023-06-25 (×2): qty 4

## 2023-06-25 MED ORDER — IPRATROPIUM-ALBUTEROL 0.5-2.5 (3) MG/3ML IN SOLN
3.0000 mL | Freq: Three times a day (TID) | RESPIRATORY_TRACT | Status: DC
  Administered 2023-06-25 – 2023-06-26 (×4): 3 mL via RESPIRATORY_TRACT
  Filled 2023-06-25 (×4): qty 3

## 2023-06-25 MED ORDER — INSULIN GLARGINE-YFGN 100 UNIT/ML ~~LOC~~ SOLN
5.0000 [IU] | Freq: Once | SUBCUTANEOUS | Status: AC
  Administered 2023-06-25: 5 [IU] via SUBCUTANEOUS
  Filled 2023-06-25: qty 0.05

## 2023-06-25 NOTE — Discharge Instructions (Addendum)
Because infections make it easier to have seizures, we have increased your Vimpat (lacosamide) temporarily. Please take 200 mg twice a day for the next week, then you may go back down to 150 mg twice a day.   Standard seizure precautions: Per [facility] statutes, patients with seizures are not allowed to drive until  they have been seizure-free for six months. Use caution when using heavy equipment or power tools. Avoid working on ladders or at heights. Take showers instead of baths. Ensure the water temperature is not too high on the home water heater. Do not go swimming alone. When caring for infants or small children, sit down when holding, feeding, or changing them to minimize risk of injury to the child in the event you have a seizure.  To reduce risk of seizures, maintain good sleep hygiene avoid alcohol and illicit drug use, take all anti-seizure medications as prescribed.

## 2023-06-25 NOTE — Progress Notes (Signed)
PROGRESS NOTE    [name]  XBJ:[id number] DOB: Jan 26, 1951 DOA: 06/23/2023 PCP: [name], NP     Brief Narrative:   From admission h and p  [name] is a 72 y.o. female with medical history significant of CHF, Nash cirrhosis, COPD on chronic oxygen, hard of hearing, depression, diabetes, GERD, hypertension, rheumatoid arthritis, seizures.  She presents with shortness of breath chest pain cough wheezing and coughing up whitish sputum and some nausea and vomiting and some diarrhea.  Patient had respiratory distress and a fever in the emergency room.  COVID test pending.  Patient feeling better on BiPAP.  Nursing staff noticed a seizure lasting about 15 seconds and was a little bit altered.  When I came in the room she was talking to me coherently and did not recall that she had a seizure.  Hospitalist services contacted for further evaluation.  Admitting with systemic inflammatory response syndrome, respiratory distress and seizure.   Assessment & Plan:   Principal Problem:   Respiratory distress Active Problems:   Seizure disorder (HCC)   Acute respiratory failure with hypoxia (HCC)   COPD with acute exacerbation (HCC)   Acute on chronic diastolic CHF (congestive heart failure) (HCC)   Systemic inflammatory response syndrome (HCC)   Seizure (HCC)   Controlled type 2 diabetes mellitus without complication, without long-term current use of insulin (HCC)   Hypokalemia   Liver cirrhosis secondary to NASH (HCC)   Hard of hearing   Asthma   History of CVA (cerebrovascular accident)   DNR (do not resuscitate)   Aortic stenosis  # Fever Suspect 2/2 bacteremia, possible CAP - continue abx as below  # Bacteremia 1/2 BCs positive for strep pneumo, most likely source is lung given frequent lung infections - continue ceftriaxone - f/u sensitivities - ID consulted  # COPD exacerbation # CAP? One day dyspnea, also productive cough. Febrile on presentation. Has been  treated for pneumonia several times this year. CXR nothing focal. CT no PE or focal infiltrate - continue ceftriaxone  - sputum for culture - continue steroids but will de-escalate to prednisone - pulm consulted and is following - home breo  # Acute on chronic hypoxic respiratory failure On 2 liters intermittently at home, here required bipap now weaned to 3 liters - continue Gandy o2, wean as able  # Seizure disorder W/ breakthrough seizure here on day of admission and again today. Neuro consulted, advises: - increase vimpat to 200 bid and continue that for about a week until current illness is resolved (illness lowers seizure threshold) and then reduce back to 150 bid  # Obesity Noted  # Deaf Reads lips well and declines use of deaf interpreter  # Aortic stenosis # HFpEF Pulm edema seen on CT - continue lasix 40 IV every day substituted for home 40 oral  # T2DM With hyperglycemia today in setting of steroids - holding home meds - will up-titrate lantus, decrease ssi sensitivity   # Hx CVA - home plavix, statin  # GAD - home celexa  # Chronic pain - home gabapentin  # HTN BPs low normal in setting of infection - hold home losart, spiro  # Elevated troponin Denies chest pain, no overt ischemic changes on EKG - repeat trop   DVT prophylaxis: lovenox Code Status: dnr Family Communication: son updated @ bedside 12/19  Level of care: Progressive Status is: Inpatient Remains inpatient appropriate because: severity of illness    Consultants:  Pulm, neuro, ID  Procedures: None thus far      Subjective: Reports cough improving, dyspnea improving, feeling more or less her normal self. Seizure with PT today, no trauma  Objective: Vitals:   06/25/23 0410 06/25/23 0753 06/25/23 0830 06/25/23 1338  BP: (!) 140/77  129/69   Pulse: 62  (!) 59   Resp: 18  16   Temp: 97.8 F (36.6 C)  (!) 97.5 F (36.4 C)   TempSrc: Oral  Oral   SpO2: 92% 96% (!) 89% 93%   Weight:      Height:        Intake/Output Summary (Last 24 hours) at 06/25/2023 1531 Last data filed at 06/25/2023 0620 Gross per 24 hour  Intake 600 ml  Output 240 ml  Net 360 ml   Filed Weights   06/23/23 1425  Weight: 74.8 kg    Examination:  General exam: Appears calm and comfortable  Respiratory system: rhonchi throughout Cardiovascular system: S1 & S2 heard, RRR.   Gastrointestinal system: Abdomen is obese, nondistended, soft and nontender. Central nervous system: Alert and oriented. No focal neurological deficits. Extremities: Symmetric 5 x 5 power. Skin: No rashes, lesions or ulcers Psychiatry: Judgement and insight appear normal. Mood & affect appropriate.     Data Reviewed: I have personally reviewed following labs and imaging studies  CBC: Recent Labs  Lab 06/23/23 0415 06/24/23 0603  WBC 13.2* 15.0*  NEUTROABS 10.0*  --   HGB 14.4 12.3  HCT 43.9 37.5  MCV 85.6 84.7  PLT 248 203   Basic Metabolic Panel: Recent Labs  Lab 06/23/23 0415 06/23/23 0425 06/24/23 0603 06/25/23 0837  NA 140  --  138 138  K 3.1*  --  3.7 3.8  CL 100  --  103 102  CO2 26  --  26 25  GLUCOSE 211*  --  234* 231*  BUN 13  --  22 21  CREATININE 0.69  --  0.76 0.77  CALCIUM 8.9  --  8.5* 8.6*  MG  --  1.3*  --  2.0   GFR: Estimated Creatinine Clearance: 62.5 mL/min (by C-G formula based on SCr of 0.77 mg/dL). Liver Function Tests: Recent Labs  Lab 06/23/23 0415  AST 26  ALT 14  ALKPHOS 89  BILITOT 0.6  PROT 6.9  ALBUMIN 3.7   No results for input(s): "LIPASE", "AMYLASE" in the last 168 hours. No results for input(s): "AMMONIA" in the last 168 hours. Coagulation Profile: Recent Labs  Lab 06/23/23 0415  INR 1.0   Cardiac Enzymes: Recent Labs  Lab 06/23/23 0425  CKTOTAL 50   BNP (last 3 results) No results for input(s): "PROBNP" in the last 8760 hours. HbA1C: No results for input(s): "HGBA1C" in the last 72 hours. CBG: Recent Labs  Lab  06/24/23 1144 06/24/23 1608 06/24/23 2153 06/25/23 0830 06/25/23 1204  GLUCAP 218* 347* 320* 250* 438*   Lipid Profile: No results for input(s): "CHOL", "HDL", "LDLCALC", "TRIG", "CHOLHDL", "LDLDIRECT" in the last 72 hours. Thyroid Function Tests: No results for input(s): "TSH", "T4TOTAL", "FREET4", "T3FREE", "THYROIDAB" in the last 72 hours. Anemia Panel: No results for input(s): "VITAMINB12", "FOLATE", "FERRITIN", "TIBC", "IRON", "RETICCTPCT" in the last 72 hours. Urine analysis:    Component Value Date/Time   COLORURINE YELLOW (A) 03/26/2023 1108   APPEARANCEUR HAZY (A) 03/26/2023 1108   APPEARANCEUR Clear 11/14/2011 1607   LABSPEC 1.018 03/26/2023 1108   LABSPEC 1.006 11/14/2011 1607   PHURINE 6.0 03/26/2023 1108   GLUCOSEU NEGATIVE 03/26/2023 1108  GLUCOSEU Negative 11/14/2011 1607   HGBUR NEGATIVE 03/26/2023 1108   BILIRUBINUR NEGATIVE 03/26/2023 1108   BILIRUBINUR Negative 11/14/2011 1607   KETONESUR NEGATIVE 03/26/2023 1108   PROTEINUR NEGATIVE 03/26/2023 1108   NITRITE NEGATIVE 03/26/2023 1108   LEUKOCYTESUR NEGATIVE 03/26/2023 1108   LEUKOCYTESUR Negative 11/14/2011 1607   Sepsis Labs: @LABRCNTIP (procalcitonin:4,lacticidven:4)  ) Recent Results (from the past 240 hours)  Blood Culture (routine x 2)     Status: None (Preliminary result)   Collection Time: 06/23/23  4:25 AM   Specimen: BLOOD  Result Value Ref Range Status   Specimen Description BLOOD LEFT FOREARM  Final   Special Requests   Final    BOTTLES DRAWN AEROBIC AND ANAEROBIC Blood Culture results may not be optimal due to an inadequate volume of blood received in culture bottles   Culture   Final    NO GROWTH 2 DAYS Performed at [facility], [address], [place], [place] [address]    Report Status PENDING  Incomplete  Blood Culture (routine x 2)     Status: Abnormal (Preliminary result)   Collection Time: 06/23/23  4:25 AM   Specimen: BLOOD  Result Value Ref Range Status    Specimen Description   Final    BLOOD LEFT ASSIST CONTROL Performed at [facility], [address], [place], [place] [address]    Special Requests   Final    BOTTLES DRAWN AEROBIC AND ANAEROBIC Blood Culture results may not be optimal due to an inadequate volume of blood received in culture bottles Performed at [facility], [address] [place], [place] [address]    Culture  Setup Time   Final    GRAM POSITIVE COCCI ANAEROBIC BOTTLE ONLY CRITICAL RESULT CALLED TO, READ BACK BY AND VERIFIED WITH: ALEX CHAPPELL AT 2141 ON 06/23/23 BY SS    Culture (A)  Final    STREPTOCOCCUS PNEUMONIAE SUSCEPTIBILITIES TO FOLLOW Performed at [facility], [address] [place], [place] [address]    Report Status PENDING  Incomplete  Resp panel by RT-PCR (RSV, Flu A&B, Covid) Anterior Nasal Swab     Status: None   Collection Time: 06/23/23  4:25 AM   Specimen: Anterior Nasal Swab  Result Value Ref Range Status   SARS Coronavirus 2 by RT PCR NEGATIVE NEGATIVE Final    Comment: (NOTE) SARS-CoV-2 target nucleic acids are NOT DETECTED.  The SARS-CoV-2 RNA is generally detectable in upper respiratory specimens during the acute phase of infection. The lowest concentration of SARS-CoV-2 viral copies this assay can detect is 138 copies/mL. A negative result does not preclude SARS-Cov-2 infection and should not be used as the sole basis for treatment or other patient management decisions. A negative result may occur with  improper specimen collection/handling, submission of specimen other than nasopharyngeal swab, presence of viral mutation(s) within the areas targeted by this assay, and inadequate number of viral copies(<138 copies/mL). A negative result must be combined with clinical observations, patient history, and epidemiological information. The expected result is Negative.  Fact Sheet for Patients:  BloggerCourse.com  Fact Sheet for  Healthcare Providers:  SeriousBroker.it  This test is no t yet approved or cleared by the [place] FDA and  has been authorized for detection and/or diagnosis of SARS-CoV-2 by FDA under an Emergency Use Authorization (EUA). This EUA will remain  in effect (meaning this test can be used) for the duration of the COVID-19 declaration under Section 564(b)(1) of the Act, 21 U.S.C.section 360bbb-3(b)(1),  unless the authorization is terminated  or revoked sooner.       Influenza A by PCR NEGATIVE NEGATIVE Final   Influenza B by PCR NEGATIVE NEGATIVE Final    Comment: (NOTE) The Xpert Xpress SARS-CoV-2/FLU/RSV plus assay is intended as an aid in the diagnosis of influenza from Nasopharyngeal swab specimens and should not be used as a sole basis for treatment. Nasal washings and aspirates are unacceptable for Xpert Xpress SARS-CoV-2/FLU/RSV testing.  Fact Sheet for Patients: BloggerCourse.com  Fact Sheet for Healthcare Providers: SeriousBroker.it  This test is not yet approved or cleared by the [place] FDA and has been authorized for detection and/or diagnosis of SARS-CoV-2 by FDA under an Emergency Use Authorization (EUA). This EUA will remain in effect (meaning this test can be used) for the duration of the COVID-19 declaration under Section 564(b)(1) of the Act, 21 U.S.C. section 360bbb-3(b)(1), unless the authorization is terminated or revoked.     Resp Syncytial Virus by PCR NEGATIVE NEGATIVE Final    Comment: (NOTE) Fact Sheet for Patients: BloggerCourse.com  Fact Sheet for Healthcare Providers: SeriousBroker.it  This test is not yet approved or cleared by the [place] FDA and has been authorized for detection and/or diagnosis of SARS-CoV-2 by FDA under an Emergency Use Authorization (EUA). This EUA will remain in effect (meaning this  test can be used) for the duration of the COVID-19 declaration under Section 564(b)(1) of the Act, 21 U.S.C. section 360bbb-3(b)(1), unless the authorization is terminated or revoked.  Performed at [facility], [address] [place], [place] [address]   Blood Culture ID Panel (Reflexed)     Status: Abnormal   Collection Time: 06/23/23  4:25 AM  Result Value Ref Range Status   Enterococcus faecalis NOT DETECTED NOT DETECTED Final   Enterococcus Faecium NOT DETECTED NOT DETECTED Final   Listeria monocytogenes NOT DETECTED NOT DETECTED Final   Staphylococcus species NOT DETECTED NOT DETECTED Final   Staphylococcus aureus (BCID) NOT DETECTED NOT DETECTED Final   Staphylococcus epidermidis NOT DETECTED NOT DETECTED Final   Staphylococcus lugdunensis NOT DETECTED NOT DETECTED Final   Streptococcus species DETECTED (A) NOT DETECTED Final    Comment: CRITICAL RESULT CALLED TO, READ BACK BY AND VERIFIED WITH: ALEX CHAPPELL AT 2141 ON 06/23/23 BY SS    Streptococcus agalactiae NOT DETECTED NOT DETECTED Final   Streptococcus pneumoniae DETECTED (A) NOT DETECTED Final    Comment: CRITICAL RESULT CALLED TO, READ BACK BY AND VERIFIED WITH: ALEX CHAPPELL AT 2141 ON 06/23/23 BY SS    Streptococcus pyogenes NOT DETECTED NOT DETECTED Final   A.calcoaceticus-baumannii NOT DETECTED NOT DETECTED Final   Bacteroides fragilis NOT DETECTED NOT DETECTED Final   Enterobacterales NOT DETECTED NOT DETECTED Final   Enterobacter cloacae complex NOT DETECTED NOT DETECTED Final   Escherichia coli NOT DETECTED NOT DETECTED Final   Klebsiella aerogenes NOT DETECTED NOT DETECTED Final   Klebsiella oxytoca NOT DETECTED NOT DETECTED Final   Klebsiella pneumoniae NOT DETECTED NOT DETECTED Final   Proteus species NOT DETECTED NOT DETECTED Final   Salmonella species NOT DETECTED NOT DETECTED Final   Serratia marcescens NOT DETECTED NOT DETECTED Final   Haemophilus influenzae NOT DETECTED NOT DETECTED  Final   Neisseria meningitidis NOT DETECTED NOT DETECTED Final   Pseudomonas aeruginosa NOT DETECTED NOT DETECTED Final   Stenotrophomonas maltophilia NOT DETECTED NOT DETECTED Final   Candida albicans NOT DETECTED NOT DETECTED Final   Candida auris NOT DETECTED NOT DETECTED Final   Candida glabrata  NOT DETECTED NOT DETECTED Final   Candida krusei NOT DETECTED NOT DETECTED Final   Candida parapsilosis NOT DETECTED NOT DETECTED Final   Candida tropicalis NOT DETECTED NOT DETECTED Final   Cryptococcus neoformans/gattii NOT DETECTED NOT DETECTED Final    Comment: Performed at [facility], [address], [place], [place] [address]  Urine Culture (for pregnant, neutropenic or urologic patients or patients with an indwelling urinary catheter)     Status: Abnormal   Collection Time: 06/23/23  7:29 AM   Specimen: Urine, Clean Catch  Result Value Ref Range Status   Specimen Description   Final    URINE, CLEAN CATCH Performed at [facility], [address], [place], [place] [address]    Special Requests   Final    Normal Performed at [facility], [address] [place], [place] [address]    Culture MULTIPLE SPECIES PRESENT, SUGGEST RECOLLECTION (A)  Final   Report Status 06/24/2023 FINAL  Final  Respiratory (~20 pathogens) panel by PCR     Status: None   Collection Time: 06/23/23  8:49 AM   Specimen: Nasopharyngeal Swab; Respiratory  Result Value Ref Range Status   Adenovirus NOT DETECTED NOT DETECTED Final   Coronavirus 229E NOT DETECTED NOT DETECTED Final    Comment: (NOTE) The Coronavirus on the Respiratory Panel, DOES NOT test for the novel  Coronavirus (2019 nCoV)    Coronavirus HKU1 NOT DETECTED NOT DETECTED Final   Coronavirus NL63 NOT DETECTED NOT DETECTED Final   Coronavirus OC43 NOT DETECTED NOT DETECTED Final   Metapneumovirus NOT DETECTED NOT DETECTED Final   Rhinovirus / Enterovirus NOT DETECTED NOT DETECTED Final   Influenza A NOT  DETECTED NOT DETECTED Final   Influenza B NOT DETECTED NOT DETECTED Final   Parainfluenza Virus 1 NOT DETECTED NOT DETECTED Final   Parainfluenza Virus 2 NOT DETECTED NOT DETECTED Final   Parainfluenza Virus 3 NOT DETECTED NOT DETECTED Final   Parainfluenza Virus 4 NOT DETECTED NOT DETECTED Final   Respiratory Syncytial Virus NOT DETECTED NOT DETECTED Final   Bordetella pertussis NOT DETECTED NOT DETECTED Final   Bordetella Parapertussis NOT DETECTED NOT DETECTED Final   Chlamydophila pneumoniae NOT DETECTED NOT DETECTED Final   Mycoplasma pneumoniae NOT DETECTED NOT DETECTED Final    Comment: Performed at [facility], [address], [place], [place] [address]  Expectorated Sputum Assessment w Gram Stain, Rflx to Resp Cult     Status: None   Collection Time: 06/24/23  4:10 PM   Specimen: Sputum  Result Value Ref Range Status   Specimen Description SPUTUM  Final   Special Requests EXPSU  Final   Sputum evaluation   Final    THIS SPECIMEN IS ACCEPTABLE FOR SPUTUM CULTURE Performed at [facility], [address], [place], [place] [address]    Report Status 06/24/2023 FINAL  Final  Culture, Respiratory w Gram Stain     Status: None (Preliminary result)   Collection Time: 06/24/23  4:10 PM   Specimen: SPU  Result Value Ref Range Status   Specimen Description   Final    SPUTUM Performed at [facility], [address] [place], [place] [address]    Special Requests   Final    EXPSU Reflexed from [id number] Performed at [facility], [address] [place], [place] [address]    Gram Stain   Final    RARE WBC PRESENT, PREDOMINANTLY PMN FEW GRAM POSITIVE COCCI    Culture  Final    CULTURE REINCUBATED FOR BETTER GROWTH Performed at [facility], [address], [place], [place] [address]    Report Status PENDING  Incomplete         Radiology Studies: CT Angio Chest Pulmonary Embolism (PE) W or WO Contrast Result Date:  06/24/2023 CLINICAL DATA:  Load immediate probability for acute pulmonary embolism. Positive D-dimer. Fever. Dyspnea. EXAM: CT ANGIOGRAPHY CHEST WITH CONTRAST TECHNIQUE: Multidetector CT imaging of the chest was performed using the standard protocol during bolus administration of intravenous contrast. Multiplanar CT image reconstructions and MIPs were obtained to evaluate the vascular anatomy. RADIATION DOSE REDUCTION: This exam was performed according to the departmental dose-optimization program which includes automated exposure control, adjustment of the mA and/or kV according to patient size and/or use of iterative reconstruction technique. CONTRAST:  75mL OMNIPAQUE IOHEXOL 350 MG/ML SOLN COMPARISON:  03/25/2023 FINDINGS: Cardiovascular: No filling defects within the pulmonary arteries to suggest acute pulmonary embolism. Mediastinum/Nodes: No axillary or supraclavicular adenopathy. No mediastinal or hilar adenopathy. No pericardial fluid. Esophagus normal. Lungs/Pleura: Atelectasis in the RIGHT middle lobe and lingula. Mild ground-glass densities suggest pulmonary edema. Evidence pneumonia. No pulmonary infarction Upper Abdomen: Limited view of the liver, kidneys, pancreas are unremarkable. Normal adrenal glands. Musculoskeletal: No aggressive osseous lesion. Review of the MIP images confirms the above findings. IMPRESSION: 1. No evidence acute pulmonary embolism. 2. Mild pulmonary edema. 3. Atelectasis in the RIGHT middle lobe and lingula. Electronically Signed   By: Genevive Bi M.D.   On: 06/24/2023 12:27        Scheduled Meds:  citalopram  10 mg Oral Daily   clopidogrel  75 mg Oral Daily   enoxaparin (LOVENOX) injection  40 mg Subcutaneous Q24H   fluticasone  1 spray Each Nare Daily   fluticasone furoate-vilanterol  1 puff Inhalation Daily   furosemide  40 mg Intravenous Daily   gabapentin  200 mg Oral QHS   insulin aspart  0-20 Units Subcutaneous TID WC   insulin aspart  0-5 Units  Subcutaneous QHS   insulin glargine-yfgn  15 Units Subcutaneous QHS   ipratropium-albuterol  3 mL Nebulization TID   lacosamide  200 mg Oral Q12H   methylPREDNISolone (SOLU-MEDROL) injection  40 mg Intravenous Q12H   metoprolol succinate  25 mg Oral Daily   montelukast  10 mg Oral QHS   pantoprazole  40 mg Oral Daily   potassium chloride SA  20 mEq Oral Daily   rosuvastatin  10 mg Oral QHS   Continuous Infusions:  cefTRIAXone (ROCEPHIN)  IV 2 g (06/25/23 3875)     LOS: 2 days     Silvano Bilis, MD [facility]   If 7PM-7AM, please contact night-coverage [email or web address] Password [email or web address] 06/25/2023, 3:31 PM

## 2023-06-25 NOTE — Progress Notes (Signed)
NEUROLOGY CONSULT FOLLOW UP NOTE   Date of service: June 25, 2023 Patient Name: [name] MRN:  [id number] DOB:  1951-03-08  Brief HPI  [name] is a 72 y.o. woman with deafness requiring ALS interpreter (can do some lipreading; today she declines interpreter and asks daughter at bedside to interpret comfortable with), known seizures (on Vimpat --off of Keppra for at least 1 year) as well as documented pseudoseizures, diabetes, hypertension congestive heart failure, cirrhosis secondary to NASH, COPD, obstructive sleep apnea not tolerant of CPAP (2 L oxygen via nasal cannula at night) depression, restless leg syndrome (on gabapentin 200 mg nightly and ropinirole 0.5 mg nightly), memory loss (SLUMS documented on 02/24/2023 as 20/30 on outpatient neurology visit), vitamin B12 deficiency.   She has been frequently seen by our inpatient service for breakthrough seizures as well as nonepileptic seizures.  Triggers frequently include hypokalemia as well as missed medication doses and infections (UTIs, COPD exacerbations)   She presented on 12/17 after awakening with shortness of breath waking her from her sleep and was noted to be initially hypoxic to 83% on room air with EMS administering DuoNebs.  She subsequently reported several days of worsening shortness of breath, productive cough and increased sputum, she was also noted to be febrile and was admitted for suspected COPD exacerbation with SIRS.  Subsequently nursing staff noticed "a seizure lasting about 15 seconds and was a little bit altered." When admitting physician came in the room she was talking to him coherently and did not recall that she had a seizure    She has a varied spell types. Of note, there were eye fluttering and staring off spells that were documented to be nonepileptic on EEG monitoring in September She did have a spell of facial twitching and fidgeting/moaning which did correlate with an electrographic seizure on  9/27 Daughter at bedside is aware of her diagnosis of pseudoseizures/"false seizures", but patient did not seem fully aware of this diagnosis   Antiseizure medications in the past included Vimpat 200 mg twice daily --on last outpatient neurology follow-up was only on 100 twice daily; but past doses have also included 150 mg tablets twice a day; son reports on doses higher than 150 mg BID she gets headaches  Keppra 1000 mg twice daily --discontinued due to weakness/somnolence per neurology notes this was sometime in fall 2023, and she has tolerated the discontinuation well without clear concern for seizures   Interval Hx/subjective   While working with PT today, he describes "She had walked about 3/4 around the RN station (~ 120 ft) prior to having her eyes start twitching rapidly. HR remains stable throughout. She was on 3 L o2 . She became non verbal so I had RN grab me a chair. We sat her in the chair ...took ~ 2 minutes for her cognition to return to prior episode."  Ativan was given at 10 AM yesterday for concern for seizure activity without notification to neurology  Has been found to have strep pneumo bacteremia with source likely the longer upper respiratory tract on ceftriaxone  Also being treated with nebulizers and Solu-Medrol for COPD exacerbation    Eager to go home for Christmas  Vitals   Vitals:   06/25/23 0155 06/25/23 0410 06/25/23 0753 06/25/23 0830  BP: 137/68 (!) 140/77  129/69  Pulse: 60 62  (!) 59  Resp: 20 18  16   Temp: 98 F (36.7 C) 97.8 F (36.6 C)  (!) 97.5 F (36.4 C)  TempSrc: Oral Oral  Oral  SpO2: 95% 92% 96% (!) 89%  Weight:      Height:         Body mass index is 29.23 kg/m.  Physical Exam   In bed, slight cough and ronchi grossly audible, nasal cannula in place  Neurologic Examination   Mental Status: Patient is awake, alert, following commands, unaware of seizure earlier today Cranial Nerves: Face symmetric, hearing intact to voice,  tracks examiner bilaterally  Motor: Right upper extremity testing limited by rotator cuff pain (chronic). No drift bilateral upper extremities. Today some drift of the left lower extremity, not the right Sensory: Sensation is symmetric to light touch  in the arms and legs. Cerebellar: High five intact bilaterally     Medications  Current Facility-Administered Medications:    cefTRIAXone (ROCEPHIN) 2 g in sodium chloride 0.9 % 100 mL IVPB, 2 g, Intravenous, Q24H, Wieting, Richard, MD, Last Rate: 200 mL/hr at 06/25/23 0608, 2 g at 06/25/23 0608   citalopram (CELEXA) tablet 10 mg, 10 mg, Oral, Daily, Renae Gloss, Richard, MD, 10 mg at 06/25/23 0846   clopidogrel (PLAVIX) tablet 75 mg, 75 mg, Oral, Daily, Wieting, Richard, MD, 75 mg at 06/25/23 0847   enoxaparin (LOVENOX) injection 40 mg, 40 mg, Subcutaneous, Q24H, Wieting, Richard, MD, 40 mg at 06/24/23 2221   fluticasone (FLONASE) 50 MCG/ACT nasal spray 1 spray, 1 spray, Each Nare, Daily, Wieting, Richard, MD   fluticasone furoate-vilanterol (BREO ELLIPTA) 100-25 MCG/ACT 1 puff, 1 puff, Inhalation, Daily, Wieting, Richard, MD, 1 puff at 06/25/23 1015   furosemide (LASIX) injection 40 mg, 40 mg, Intravenous, Daily, Wieting, Richard, MD, 40 mg at 06/25/23 0845   gabapentin (NEURONTIN) capsule 200 mg, 200 mg, Oral, QHS, Wieting, Richard, MD, 200 mg at 06/24/23 2222   insulin aspart (novoLOG) injection 0-5 Units, 0-5 Units, Subcutaneous, QHS, Wieting, Richard, MD, 4 Units at 06/24/23 2221   insulin aspart (novoLOG) injection 0-6 Units, 0-6 Units, Subcutaneous, TID WC, Alford Highland, MD, 2 Units at 06/25/23 0846   insulin glargine-yfgn (SEMGLEE) injection 10 Units, 10 Units, Subcutaneous, Daily, Wouk, Wilfred Curtis, MD, 10 Units at 06/25/23 0847   ipratropium-albuterol (DUONEB) 0.5-2.5 (3) MG/3ML nebulizer solution 3 mL, 3 mL, Nebulization, TID, Wouk, Wilfred Curtis, MD   lacosamide (VIMPAT) tablet 150 mg, 150 mg, Oral, Q12H, Wieting, Richard, MD, 150  mg at 06/25/23 0847   LORazepam (ATIVAN) injection 1 mg, 1 mg, Intravenous, Q2H PRN, Cordie Beazley L, MD, 1 mg at 06/24/23 1017   methylPREDNISolone sodium succinate (SOLU-MEDROL) 40 mg/mL injection 40 mg, 40 mg, Intravenous, Q12H, Wieting, Richard, MD, 40 mg at 06/25/23 0846   metoprolol succinate (TOPROL-XL) 24 hr tablet 25 mg, 25 mg, Oral, Daily, Wieting, Richard, MD, 25 mg at 06/25/23 0847   montelukast (SINGULAIR) tablet 10 mg, 10 mg, Oral, QHS, Wieting, Richard, MD, 10 mg at 06/24/23 2222   ondansetron (ZOFRAN) tablet 4 mg, 4 mg, Oral, Q6H PRN **OR** ondansetron (ZOFRAN) injection 4 mg, 4 mg, Intravenous, Q6H PRN, Wieting, Richard, MD   pantoprazole (PROTONIX) EC tablet 40 mg, 40 mg, Oral, Daily, Wieting, Richard, MD, 40 mg at 06/25/23 0847   potassium chloride SA (KLOR-CON M) CR tablet 20 mEq, 20 mEq, Oral, Daily, Wieting, Richard, MD, 20 mEq at 06/25/23 0847   rosuvastatin (CRESTOR) tablet 10 mg, 10 mg, Oral, QHS, Alford Highland, MD, 10 mg at 06/24/23 2222 Labs and Diagnostic Imaging    Basic Metabolic Panel: Recent Labs  Lab 06/23/23 0415 06/23/23 0425 06/24/23 0603 06/25/23  0837  NA 140  --  138 138  K 3.1*  --  3.7 3.8  CL 100  --  103 102  CO2 26  --  26 25  GLUCOSE 211*  --  234* 231*  BUN 13  --  22 21  CREATININE 0.69  --  0.76 0.77  CALCIUM 8.9  --  8.5* 8.6*  MG  --  1.3*  --   --     CBC: Recent Labs  Lab 06/23/23 0415 06/24/23 0603  WBC 13.2* 15.0*  NEUTROABS 10.0*  --   HGB 14.4 12.3  HCT 43.9 37.5  MCV 85.6 84.7  PLT 248 203    Coagulation Studies: Recent Labs    06/23/23 0415  LABPROT 13.2  INR 1.0      Lipid Panel:  Lab Results  Component Value Date   LDLCALC 60 11/19/2022   HgbA1c:  Lab Results  Component Value Date   HGBA1C 7.1 (H) 03/19/2023   Urine Drug Screen:     Component Value Date/Time   LABOPIA NONE DETECTED 03/31/2022 0753   COCAINSCRNUR NONE DETECTED 03/31/2022 0753   LABBENZ POSITIVE (A) 03/31/2022 0753    AMPHETMU NONE DETECTED 03/31/2022 0753   THCU NONE DETECTED 03/31/2022 0753   LABBARB NONE DETECTED 03/31/2022 0753    Alcohol Level     Component Value Date/Time   ETH <10 09/29/2021 2136   INR  Lab Results  Component Value Date   INR 1.0 06/23/2023   APTT  Lab Results  Component Value Date   APTT 27 06/23/2023   AED levels:  Lab Results  Component Value Date   PHENYTOIN 12.6 05/16/2012     Assessment   Likely lowered seizure threshold in the setting of infection and hospitalization, for which I will temporarily increase antiseizure medications as below  Recommendations  -Increase home Vimpat 200 mg BID for one week, then resume 150 mg BID; extra 50 mg once now -Add on magnesium to confirm adequate repletion, resulted at 2 -Ativan as needed for seizure activity lasting greater than 5 minutes, neurology should be notified if this medication is used -No indication for EEG at this time as she is at baseline -Appreciate remainder of management of respiratory issues and infection per primary team -Close outpatient follow-up with Dr. Sherryll Burger is recommended -Discharge instructions updated with seizure precautions to be reviewed with family prior to discharge -Inpatient neurology will sign off at this time, please do not hesitate to reach out if additional questions or concerns arise, including any seizure activity requiring use of Ativan. Recommendations conveyed to primary team via secure chat, including notification that patient receives her meds for discharge in blister packs.   Standard seizure precautions: Per [facility] statutes, patients with seizures are not allowed to drive until  they have been seizure-free for six months. Use caution when using heavy equipment or power tools. Avoid working on ladders or at heights. Take showers instead of baths. Ensure the water temperature is not too high on the home water heater. Do not go swimming alone. When caring for infants  or small children, sit down when holding, feeding, or changing them to minimize risk of injury to the child in the event you have a seizure.  To reduce risk of seizures, maintain good sleep hygiene avoid alcohol and illicit drug use, take all anti-seizure medications as prescribed.    ______________________________________________________________________   Nunzio Cory MD-PhD [facility] [phone]   [facility] coverage for  [facility] is from 8 AM to 4 AM in-house and 4 PM to 8 PM by telephone/video. 8 PM to 8 AM emergent questions or overnight urgent questions should be addressed to Teleneurology On-call or [facility] neurohospitalist; contact information can be found on AMION

## 2023-06-25 NOTE — Inpatient Diabetes Management (Signed)
Inpatient Diabetes Program Recommendations  AACE/ADA: New Consensus Statement on Inpatient Glycemic Control  Target Ranges:  Prepandial:   less than 140 mg/dL      Peak postprandial:   less than 180 mg/dL (1-2 hours)      Critically ill patients:  140 - 180 mg/dL    Latest Reference Range & Units 06/24/23 07:24 06/24/23 11:44 06/24/23 16:08 06/24/23 21:53 06/25/23 08:30 06/25/23 12:04  Glucose-Capillary 70 - 99 mg/dL 161 (H) 096 (H) 045 (H) 320 (H) 250 (H) 438 (H)    Review of Glycemic Control  Diabetes history: DM2 Outpatient Diabetes medications: Amaryl 2 mg daily Current orders for Inpatient glycemic control: Semglee 15 units daily at bedtime, Novolog 0-20 units TID with meals, Novolog 0-5 units at bedtime; Solumedrol 40 mg Q12H  Inpatient Diabetes Program Recommendations:    Insulin: Noted Semglee increased to 15 units daily at bedtime; patient received Semglee 10 units at 8:47 am today. Please order one time Semglee 5 units x1 now and change frequency of Semglee to 15 units daily (to start at 10am on 06/26/23). If steroids are continued, please consider ordering Novolog 5 units TID with meals for meal coverage if patient eats at least 50% of meals.  Thanks, Orlando Penner, RN, MSN, CDCES Diabetes Coordinator Inpatient Diabetes Program [phone] (Team Pager from 8am to 5pm)

## 2023-06-25 NOTE — Progress Notes (Signed)
Physical Therapy Treatment Patient Details Name: [name] MRN: [id number] DOB: [birth year] Today's Date: 06/25/2023   History of Present Illness Pt is a 72 y.o. female admitted with systemic inflammatory response syndrome, respiratory distress and seizure. PMH significant of CHF, nash cirrhosis, COPD on chronic oxygen, deaf, depression, diabetes, GERD, HTN, rheumatoid arthritis, & seizures    PT Comments  Pt was long sitting in bed upon arrival. She endorses feeling tired but was also eager to get OB and ambulate. Pt is able to consistently read lips and answers questions appropriately. Attempted to wean pt off O2 prior to OB activity however even just sitting EOB without O2, desaturates to 86%. Reapplied O2 with sao2 staying > 87% throughout remainder of session. Pt was easily and safely able to exit bed. She stood EOB 3 x prior to ambulating into hallway. Pt has severe balance deficits and is at high risk of falls. Highly encouraged pt to use a SPC at DC for additional safety. Pt ambulated ~ 120 ft prior to having rapid eye twitching and becoming non verbal. RN grab chair and pt was placed in chair. Pt demonstrates traits of seizure and has hx of seizure in past. Pt mostly non verbal/unresponsive for ~ 2 minutes prior to alertness returning and pt seemingly returning to PLOF/cognition back to baseline. RN and MDs made aware. HR remained stable.  Pt pushed in recliner back to her room. Acute PT will continue to follow and progress per current POC. Pt endorses having son who will be staying with her over next few weeks. Highly recommend 24/7 assistance for foreseeable future.     If plan is discharge home, recommend the following: A little help with walking and/or transfers;A little help with bathing/dressing/bathroom;Assist for transportation;Help with stairs or ramp for entrance     Equipment Recommendations  None recommended by PT (pt endorses having SPC/RW at home already)        Precautions / Restrictions Precautions Precautions: Fall Precaution Comments: deaf (Able to read lips well) Restrictions Weight Bearing Restrictions Per Provider Order: No     Mobility  Bed Mobility Overal bed mobility: Modified Independent   Transfers Overall transfer level: Needs assistance Equipment used: 1 person hand held assist Transfers: Sit to/from Stand Sit to Stand: Contact guard assist  General transfer comment: CGA for safety. pt is unsteady without at least +1 UE support    Ambulation/Gait Ambulation/Gait assistance: Contact guard assist, Min assist Gait Distance (Feet): 120 Feet Assistive device: 1 person hand held assist, None Gait Pattern/deviations: Antalgic, Step-through pattern, Staggering left, Staggering right Gait velocity: decreased  General Gait Details: pt ambulated with +1 UE support mostly due to inability to tolerate use of RUE. pt is very unsteady without at least +1 UE support    Balance Overall balance assessment: Needs assistance, Mild deficits observed, not formally tested Sitting-balance support: Feet unsupported Sitting balance-Leahy Scale: Good     Standing balance support: No upper extremity supported, During functional activity Standing balance-Leahy Scale: Poor Standing balance comment: pt is at high risk of falls without UE support       Cognition Arousal: Alert Behavior During Therapy: WFL for tasks assessed/performed Overall Cognitive Status: Within Functional Limits for tasks assessed      General Comments: Pt is A and O x 4               Pertinent Vitals/Pain Pain Assessment Pain Assessment: 0-10 Pain Score: 4  Pain Location: R shoulder Pain Descriptors / Indicators: Discomfort Pain  Intervention(s): Monitored during session, Premedicated before session, Limited activity within patient's tolerance     PT Goals (current goals can now be found in the care plan section) Acute Rehab PT Goals Patient Stated Goal: go  home Progress towards PT goals: Progressing toward goals    Frequency    Min 1X/week           Co-evaluation     PT goals addressed during session: Mobility/safety with mobility;Balance;Proper use of DME;Strengthening/ROM        AM-PAC PT "6 Clicks" Mobility   Outcome Measure  Help needed turning from your back to your side while in a flat bed without using bedrails?: A Little Help needed moving from lying on your back to sitting on the side of a flat bed without using bedrails?: A Little Help needed moving to and from a bed to a chair (including a wheelchair)?: A Little Help needed standing up from a chair using your arms (e.g., wheelchair or bedside chair)?: A Little Help needed to walk in hospital room?: A Little Help needed climbing 3-5 steps with a railing? : A Little 6 Click Score: 18    End of Session Equipment Utilized During Treatment: Oxygen Activity Tolerance: Patient limited by fatigue;Other (comment) (limited by active seizure in hallway) Patient left: in chair;with call bell/phone within reach;with nursing/sitter in room Nurse Communication: Mobility status PT Visit Diagnosis: Other abnormalities of gait and mobility (R26.89);Muscle weakness (generalized) (M62.81);Difficulty in walking, not elsewhere classified (R26.2);Unsteadiness on feet (R26.81)     Time: 1010-1037 PT Time Calculation (min) (ACUTE ONLY): 27 min  Charges:    $Gait Training: 8-22 mins $Therapeutic Activity: 8-22 mins PT General Charges $$ ACUTE PT VISIT: 1 Visit                     Jetta Lout PTA 06/25/23, 12:22 PM

## 2023-06-25 NOTE — Progress Notes (Signed)
   06/25/23 2219  BiPAP/CPAP/SIPAP  Reason BIPAP/CPAP not in use Non-compliant (Refused to have cpap. Pt said she has tried it so many times but did not like it. Pt said only wears o2 at home. Cpap taken out of the room.)

## 2023-06-25 NOTE — Progress Notes (Signed)
Date of Admission:  06/23/2023      ID: [name] is a 72 y.o. female   Principal Problem:   Respiratory distress Active Problems:   Controlled type 2 diabetes mellitus without complication, without long-term current use of insulin (HCC)   Seizure (HCC)   Hypokalemia   Seizure disorder (HCC)   Acute on chronic diastolic CHF (congestive heart failure) (HCC)   Hard of hearing   Liver cirrhosis secondary to NASH (HCC)   Asthma   History of CVA (cerebrovascular accident)   COPD with acute exacerbation (HCC)   Acute respiratory failure with hypoxia (HCC)   Systemic inflammatory response syndrome (HCC)   DNR (do not resuscitate)   Aortic stenosis    Subjective: Says she is feeling better No fever Breathing better  Medications:   citalopram  10 mg Oral Daily   clopidogrel  75 mg Oral Daily   enoxaparin (LOVENOX) injection  40 mg Subcutaneous Q24H   fluticasone  1 spray Each Nare Daily   fluticasone furoate-vilanterol  1 puff Inhalation Daily   furosemide  40 mg Intravenous Daily   gabapentin  200 mg Oral QHS   insulin aspart  0-20 Units Subcutaneous TID WC   insulin aspart  0-5 Units Subcutaneous QHS   [START ON 06/26/2023] insulin glargine-yfgn  15 Units Subcutaneous Daily   insulin glargine-yfgn  5 Units Subcutaneous Once   ipratropium-albuterol  3 mL Nebulization TID   lacosamide  200 mg Oral Q12H   metoprolol succinate  25 mg Oral Daily   montelukast  10 mg Oral QHS   pantoprazole  40 mg Oral Daily   potassium chloride SA  20 mEq Oral Daily   [START ON 06/26/2023] predniSONE  40 mg Oral Q breakfast   rosuvastatin  10 mg Oral QHS    Objective: Vital signs in last 24 hours: Patient Vitals for the past 24 hrs:  BP Temp Temp src Pulse Resp SpO2  06/25/23 1338 -- -- -- -- -- 93 %  06/25/23 0830 129/69 (!) 97.5 F (36.4 C) Oral (!) 59 16 (!) 89 %  06/25/23 0753 -- -- -- -- -- 96 %  06/25/23 0410 (!) 140/77 97.8 F (36.6 C) Oral 62 18 92 %  06/25/23 0155  137/68 98 F (36.7 C) Oral 60 20 95 %  06/24/23 2355 (!) 123/52 97.9 F (36.6 C) -- 64 (!) 36 96 %  06/24/23 2346 (!) 114/44 97.9 F (36.6 C) -- 64 18 92 %  06/24/23 2126 (!) 124/56 97.7 F (36.5 C) Axillary 69 20 96 %  06/24/23 1855 (!) 120/52 -- -- 70 (!) 25 98 %  06/24/23 1655 -- 98.1 F (36.7 C) Oral -- -- --      PHYSICAL EXAM:  General: Alert, cooperative, no distress, appears stated age.  Head: Normocephalic, without obvious abnormality, atraumatic. Eyes: Conjunctivae clear, anicteric sclerae. Pupils are equal ENT Nares normal. No drainage or sinus tenderness. Lips, mucosa, and tongue normal. No Thrush Neck: Supple, symmetrical, no adenopathy, thyroid: non tender no carotid bruit and no JVD. Back: No CVA tenderness. Lungs: b/l air entry Heart: Regular rate and rhythm, no murmur, rub or gallop. Abdomen: Soft, non-tender,not distended. Bowel sounds normal. No masses Extremities: atraumatic, no cyanosis. No edema. No clubbing Skin: No rashes or lesions. Or bruising Lymph: Cervical, supraclavicular normal. Neurologic: Grossly non-focal  Lab Results    Latest Ref Rng & Units 06/24/2023    6:03 AM 06/23/2023    4:15 AM 03/25/2023  7:57 AM  CBC  WBC 4.0 - 10.5 K/uL 15.0  13.2  9.5   Hemoglobin 12.0 - 15.0 g/dL 13.2  44.0  10.2   Hematocrit 36.0 - 46.0 % 37.5  43.9  44.7   Platelets 150 - 400 K/uL 203  248  250        Latest Ref Rng & Units 06/25/2023    8:37 AM 06/24/2023    6:03 AM 06/23/2023    4:15 AM  CMP  Glucose 70 - 99 mg/dL 725  366  440   BUN 8 - 23 mg/dL 21  22  13    Creatinine 0.44 - 1.00 mg/dL 3.47  4.25  9.56   Sodium 135 - 145 mmol/L 138  138  140   Potassium 3.5 - 5.1 mmol/L 3.8  3.7  3.1   Chloride 98 - 111 mmol/L 102  103  100   CO2 22 - 32 mmol/L 25  26  26    Calcium 8.9 - 10.3 mg/dL 8.6  8.5  8.9   Total Protein 6.5 - 8.1 g/dL   6.9   Total Bilirubin <1.2 mg/dL   0.6   Alkaline Phos 38 - 126 U/L   89   AST 15 - 41 U/L   26   ALT 0 - 44  U/L   14       Microbiology: BC- strep pneumo  Studies/Results: CT Angio Chest Pulmonary Embolism (PE) W or WO Contrast Result Date: 06/24/2023 CLINICAL DATA:  Load immediate probability for acute pulmonary embolism. Positive D-dimer. Fever. Dyspnea. EXAM: CT ANGIOGRAPHY CHEST WITH CONTRAST TECHNIQUE: Multidetector CT imaging of the chest was performed using the standard protocol during bolus administration of intravenous contrast. Multiplanar CT image reconstructions and MIPs were obtained to evaluate the vascular anatomy. RADIATION DOSE REDUCTION: This exam was performed according to the departmental dose-optimization program which includes automated exposure control, adjustment of the mA and/or kV according to patient size and/or use of iterative reconstruction technique. CONTRAST:  75mL OMNIPAQUE IOHEXOL 350 MG/ML SOLN COMPARISON:  03/25/2023 FINDINGS: Cardiovascular: No filling defects within the pulmonary arteries to suggest acute pulmonary embolism. Mediastinum/Nodes: No axillary or supraclavicular adenopathy. No mediastinal or hilar adenopathy. No pericardial fluid. Esophagus normal. Lungs/Pleura: Atelectasis in the RIGHT middle lobe and lingula. Mild ground-glass densities suggest pulmonary edema. Evidence pneumonia. No pulmonary infarction Upper Abdomen: Limited view of the liver, kidneys, pancreas are unremarkable. Normal adrenal glands. Musculoskeletal: No aggressive osseous lesion. Review of the MIP images confirms the above findings. IMPRESSION: 1. No evidence acute pulmonary embolism. 2. Mild pulmonary edema. 3. Atelectasis in the RIGHT middle lobe and lingula. Electronically Signed   By: Genevive Bi M.D.   On: 06/24/2023 12:27     Assessment/Plan: Strep pneumo bacteremia Source likely to be the lung or upper resp tract Currently on ceftriaxone - await susceptibility to decide on Po antibiotic on discharge- fr 7-10 d  Pt has multiple antibiotic allergies listed will discuss with  her once susceptibility is back  COPD exacerbation- started solumedrol/nebs   CHF - on  lasix   Seizure disorder on lacosamide   Discussed the management with the patient and the hosptialist

## 2023-06-26 DIAGNOSIS — B953 Streptococcus pneumoniae as the cause of diseases classified elsewhere: Secondary | ICD-10-CM | POA: Diagnosis not present

## 2023-06-26 DIAGNOSIS — J441 Chronic obstructive pulmonary disease with (acute) exacerbation: Secondary | ICD-10-CM | POA: Diagnosis not present

## 2023-06-26 DIAGNOSIS — R0603 Acute respiratory distress: Secondary | ICD-10-CM | POA: Diagnosis not present

## 2023-06-26 DIAGNOSIS — R7881 Bacteremia: Secondary | ICD-10-CM | POA: Diagnosis not present

## 2023-06-26 LAB — CBC WITH DIFFERENTIAL/PLATELET
Abs Immature Granulocytes: 0.04 10*3/uL (ref 0.00–0.07)
Basophils Absolute: 0 10*3/uL (ref 0.0–0.1)
Basophils Relative: 0 %
Eosinophils Absolute: 0 10*3/uL (ref 0.0–0.5)
Eosinophils Relative: 0 %
HCT: 38.8 % (ref 36.0–46.0)
Hemoglobin: 12.7 g/dL (ref 12.0–15.0)
Immature Granulocytes: 1 %
Lymphocytes Relative: 13 %
Lymphs Abs: 0.8 10*3/uL (ref 0.7–4.0)
MCH: 27.9 pg (ref 26.0–34.0)
MCHC: 32.7 g/dL (ref 30.0–36.0)
MCV: 85.1 fL (ref 80.0–100.0)
Monocytes Absolute: 0.2 10*3/uL (ref 0.1–1.0)
Monocytes Relative: 4 %
Neutro Abs: 5.4 10*3/uL (ref 1.7–7.7)
Neutrophils Relative %: 82 %
Platelets: 204 10*3/uL (ref 150–400)
RBC: 4.56 MIL/uL (ref 3.87–5.11)
RDW: 13.5 % (ref 11.5–15.5)
WBC: 6.5 10*3/uL (ref 4.0–10.5)
nRBC: 0 % (ref 0.0–0.2)

## 2023-06-26 LAB — CULTURE, BLOOD (ROUTINE X 2)

## 2023-06-26 LAB — BASIC METABOLIC PANEL
Anion gap: 8 (ref 5–15)
BUN: 19 mg/dL (ref 8–23)
CO2: 28 mmol/L (ref 22–32)
Calcium: 8.5 mg/dL — ABNORMAL LOW (ref 8.9–10.3)
Chloride: 106 mmol/L (ref 98–111)
Creatinine, Ser: 0.67 mg/dL (ref 0.44–1.00)
GFR, Estimated: >60 mL/min (ref >60)
Glucose, Bld: 87 mg/dL (ref 70–99)
Potassium: 3.6 mmol/L (ref 3.5–5.1)
Sodium: 142 mmol/L (ref 135–145)

## 2023-06-26 LAB — GLUCOSE, CAPILLARY
Glucose-Capillary: 207 mg/dL — ABNORMAL HIGH (ref 70–99)
Glucose-Capillary: 134 mg/dL — ABNORMAL HIGH (ref 70–99)

## 2023-06-26 MED ORDER — LACOSAMIDE 50 MG PO TABS: ORAL_TABLET | ORAL | 1 refills | Status: DC

## 2023-06-26 MED ORDER — AMOXICILLIN 500 MG PO CAPS
1000.0000 mg | ORAL_CAPSULE | ORAL | Status: AC
Start: 2023-06-26 — End: 2023-06-26
  Administered 2023-06-26: 1000 mg via ORAL
  Filled 2023-06-26: qty 2

## 2023-06-26 MED ORDER — AMOXICILLIN 500 MG PO TABS: 1000.0000 mg | ORAL_TABLET | Freq: Three times a day (TID) | ORAL | 0 refills | Status: DC

## 2023-06-26 NOTE — Progress Notes (Signed)
NEUROLOGY CONSULT FOLLOW UP NOTE   Date of service: June 26, 2023 Patient Name: [name] MRN:  [id number] DOB:  [birth year]  Brief HPI  [name] is a 72 y.o. woman with deafness requiring ALS interpreter (can do some lipreading; today she declines interpreter and asks daughter at bedside to interpret comfortable with), known seizures (on Vimpat --off of Keppra for at least 1 year) as well as documented pseudoseizures, diabetes, hypertension congestive heart failure, cirrhosis secondary to NASH, COPD, obstructive sleep apnea not tolerant of CPAP (2 L oxygen via nasal cannula at night) depression, restless leg syndrome (on gabapentin 200 mg nightly and ropinirole 0.5 mg nightly), memory loss (SLUMS documented on 02/24/2023 as 20/30 on outpatient neurology visit), vitamin B12 deficiency.   She has been frequently seen by our inpatient service for breakthrough seizures as well as nonepileptic seizures.  Triggers frequently include hypokalemia as well as missed medication doses and infections (UTIs, COPD exacerbations)   She presented on 12/17 after awakening with shortness of breath waking her from her sleep and was noted to be initially hypoxic to 83% on room air with EMS administering DuoNebs.  She subsequently reported several days of worsening shortness of breath, productive cough and increased sputum, she was also noted to be febrile and was admitted for suspected COPD exacerbation with SIRS.  Subsequently nursing staff noticed "a seizure lasting about 15 seconds and was a little bit altered." When admitting physician came in the room she was talking to him coherently and did not recall that she had a seizure    She has a varied spell types. Of note, there were eye fluttering and staring off spells that were documented to be nonepileptic on EEG monitoring in September She did have a spell of facial twitching and fidgeting/moaning which did correlate with an electrographic seizure on  9/27 Daughter at bedside is aware of her diagnosis of pseudoseizures/"false seizures", but patient did not seem fully aware of this diagnosis   Antiseizure medications in the past included Vimpat 200 mg twice daily --on last outpatient neurology follow-up was only on 100 twice daily; but past doses have also included 150 mg tablets twice a day; son reports on doses higher than 150 mg BID she gets headaches  Keppra 1000 mg twice daily --discontinued due to weakness/somnolence per neurology notes this was sometime in fall 2023, and she has tolerated the discontinuation well without clear concern for seizures  12/18 event c/f seizure, neuro not notified  12/19 event c/f seizure, increased Vimpat   Interval Hx/subjective   Today brief event staring off to the right, less responsive  Again, essentially back to baseline at the time of my evaluation other than reporting she is sleepy and wants to sleep  Vitals   Vitals:   06/26/23 0031 06/26/23 0445 06/26/23 0824 06/26/23 0933  BP: (!) 155/105 (!) 142/71 (!) 154/59 124/64  Pulse: 64 (!) 57 (!) 57 64  Resp: (!) 21 20 20    Temp: 97.9 F (36.6 C) 97.7 F (36.5 C) 98.4 F (36.9 C) 97.6 F (36.4 C)  TempSrc: Oral Oral Oral Oral  SpO2: 93% 95% 96% 96%  Weight:      Height:         Body mass index is 29.23 kg/m.  Physical Exam   In bed, slight cough and ronchi grossly audible, nasal cannula in place  Neurologic Examination   Mental Status: Patient is awake, alert, following commands, unaware of seizure like activity Cranial Nerves: Face  symmetric, hearing intact to reading lips, tracks examiner bilaterally  Motor: Right upper extremity testing limited by rotator cuff pain (chronic). No drift bilateral upper extremities. Today some drift of the right lower extremity and not the left (opposite from yesterday and day before no drift in either) Sensory: Sensation is symmetric to light touch  in the arms and  legs.   Medications  Current Facility-Administered Medications:    cefTRIAXone (ROCEPHIN) 2 g in sodium chloride 0.9 % 100 mL IVPB, 2 g, Intravenous, Q24H, Wieting, Richard, MD, Stopped at 06/26/23 1478   citalopram (CELEXA) tablet 10 mg, 10 mg, Oral, Daily, Renae Gloss, Richard, MD, 10 mg at 06/26/23 0910   clopidogrel (PLAVIX) tablet 75 mg, 75 mg, Oral, Daily, Alford Highland, MD, 75 mg at 06/26/23 0910   enoxaparin (LOVENOX) injection 40 mg, 40 mg, Subcutaneous, Q24H, Wieting, Richard, MD, 40 mg at 06/25/23 2159   fluticasone (FLONASE) 50 MCG/ACT nasal spray 1 spray, 1 spray, Each Nare, Daily, Wieting, Richard, MD   fluticasone furoate-vilanterol (BREO ELLIPTA) 100-25 MCG/ACT 1 puff, 1 puff, Inhalation, Daily, Wieting, Richard, MD, 1 puff at 06/26/23 0915   furosemide (LASIX) injection 40 mg, 40 mg, Intravenous, Daily, Renae Gloss, Richard, MD, 40 mg at 06/25/23 0845   gabapentin (NEURONTIN) capsule 200 mg, 200 mg, Oral, QHS, Wieting, Richard, MD, 200 mg at 06/25/23 2200   insulin aspart (novoLOG) injection 0-20 Units, 0-20 Units, Subcutaneous, TID WC, Wouk, Wilfred Curtis, MD, 3 Units at 06/26/23 2956   insulin aspart (novoLOG) injection 0-5 Units, 0-5 Units, Subcutaneous, QHS, Wouk, Wilfred Curtis, MD   insulin glargine-yfgn [facility]) injection 15 Units, 15 Units, Subcutaneous, Daily, Wouk, Wilfred Curtis, MD   ipratropium-albuterol (DUONEB) 0.5-2.5 (3) MG/3ML nebulizer solution 3 mL, 3 mL, Nebulization, TID, Wouk, Wilfred Curtis, MD, 3 mL at 06/26/23 0735   lacosamide (VIMPAT) tablet 200 mg, 200 mg, Oral, Q12H, Lyndzie Zentz L, MD, 200 mg at 06/26/23 0910   LORazepam (ATIVAN) injection 1 mg, 1 mg, Intravenous, Q2H PRN, Madix Blowe L, MD, 1 mg at 06/24/23 1017   metoprolol succinate (TOPROL-XL) 24 hr tablet 25 mg, 25 mg, Oral, Daily, Wieting, Richard, MD, 25 mg at 06/26/23 0913   montelukast (SINGULAIR) tablet 10 mg, 10 mg, Oral, QHS, Wieting, Richard, MD, 10 mg at 06/25/23 2159   ondansetron  (ZOFRAN) tablet 4 mg, 4 mg, Oral, Q6H PRN **OR** ondansetron (ZOFRAN) injection 4 mg, 4 mg, Intravenous, Q6H PRN, Renae Gloss, Richard, MD   pantoprazole (PROTONIX) EC tablet 40 mg, 40 mg, Oral, Daily, Wieting, Richard, MD, 40 mg at 06/26/23 2130   potassium chloride SA (KLOR-CON M) CR tablet 20 mEq, 20 mEq, Oral, Daily, Wieting, Richard, MD, 20 mEq at 06/26/23 0912   predniSONE (DELTASONE) tablet 40 mg, 40 mg, Oral, Q breakfast, Wouk, Wilfred Curtis, MD, 40 mg at 06/26/23 0911   rosuvastatin (CRESTOR) tablet 10 mg, 10 mg, Oral, QHS, Wieting, Richard, MD, 10 mg at 06/25/23 2200 Labs and Diagnostic Imaging    Basic Metabolic Panel: Recent Labs  Lab 06/23/23 0415 06/23/23 0425 06/24/23 0603 06/25/23 0837 06/26/23 0551  NA 140  --  138 138 142  K 3.1*  --  3.7 3.8 3.6  CL 100  --  103 102 106  CO2 26  --  26 25 28   GLUCOSE 211*  --  234* 231* 87  BUN 13  --  22 21 19   CREATININE 0.69  --  0.76 0.77 0.67  CALCIUM 8.9  --  8.5* 8.6* 8.5*  MG  --  1.3*  --  2.0  --     CBC: Recent Labs  Lab 06/23/23 0415 06/24/23 0603  WBC 13.2* 15.0*  NEUTROABS 10.0*  --   HGB 14.4 12.3  HCT 43.9 37.5  MCV 85.6 84.7  PLT 248 203    Lipid Panel:  Lab Results  Component Value Date   LDLCALC 60 11/19/2022   HgbA1c:  Lab Results  Component Value Date   HGBA1C 7.1 (H) 03/19/2023   Urine Drug Screen:     Component Value Date/Time   LABOPIA NONE DETECTED 03/31/2022 0753   COCAINSCRNUR NONE DETECTED 03/31/2022 0753   LABBENZ POSITIVE (A) 03/31/2022 0753   AMPHETMU NONE DETECTED 03/31/2022 0753   THCU NONE DETECTED 03/31/2022 0753   LABBARB NONE DETECTED 03/31/2022 0753    Alcohol Level     Component Value Date/Time   ETH <10 09/29/2021 2136   INR  Lab Results  Component Value Date   INR 1.0 06/23/2023   APTT  Lab Results  Component Value Date   APTT 27 06/23/2023   AED levels:  Lab Results  Component Value Date   PHENYTOIN 12.6 05/16/2012     Assessment   Likely  lowered seizure threshold in the setting of infection and hospitalization, for which I will temporarily increase antiseizure medications as below.  She does have concurrent pseudoseizures and it is unclear that the event this morning truly represents a seizure.  Discussed again with family at bedside her diagnosis of "false seizures" in addition to electrographic seizures  Patient and family have a strong preference to minimize antiseizure medications, will not increase any medications further today  Recommendations  -Continue Vimpat 200 mg BID for one week, then resume home dose of 150 mg BID; (started on 12/19 due to increase seizure activity) -Ativan as needed for seizure activity lasting greater than 5 minutes, neurology should be notified if this medication is used -No indication for EEG at this time as she is at baseline -Appreciate remainder of management of respiratory issues and infection per primary team -Close outpatient follow-up with Dr. Sherryll Burger is recommended -Discharge instructions updated with seizure precautions to be reviewed with family prior to discharge -Inpatient neurology will sign off at this time, please do not hesitate to reach out if additional questions or concerns arise, including any seizure activity requiring use of Ativan. Recommendations conveyed to primary team via secure chat, including notification that patient receives her meds for discharge in blister packs.   Standard seizure precautions: Per [facility] statutes, patients with seizures are not allowed to drive until  they have been seizure-free for six months. Use caution when using heavy equipment or power tools. Avoid working on ladders or at heights. Take showers instead of baths. Ensure the water temperature is not too high on the home water heater. Do not go swimming alone. When caring for infants or small children, sit down when holding, feeding, or changing them to minimize risk of injury to the child  in the event you have a seizure.  To reduce risk of seizures, maintain good sleep hygiene avoid alcohol and illicit drug use, take all anti-seizure medications as prescribed.    ______________________________________________________________________   Nunzio Cory MD-PhD [facility] [phone]   [facility] coverage for [facility] is from 8 AM to 4 AM in-house and 4 PM to 8 PM by telephone/video. 8 PM to 8 AM emergent questions or overnight urgent questions should be addressed to Teleneurology On-call or [facility] neurohospitalist; contact  information can be found on AMION

## 2023-06-26 NOTE — Care Management Important Message (Signed)
Important Message  Patient Details  Name: ANNE-MARIE BATTANI MRN: [id number] Date of Birth: 1951-07-06   Important Message Given:  Yes - Medicare IM     Bernadette Hoit 06/26/2023, 10:23 AM

## 2023-06-26 NOTE — Discharge Summary (Signed)
[name]:[id number] DOB: 01/23/1951 DOA: 06/23/2023  PCP: [name], NP  Admit date: 06/23/2023 Discharge date: 06/26/2023  Time spent: 35 minutes  Recommendations for Outpatient Follow-up:  Pcp f/u 1 week     Discharge Diagnoses:  Principal Problem:   Respiratory distress Active Problems:   Seizure disorder (HCC)   Acute respiratory failure with hypoxia (HCC)   COPD with acute exacerbation (HCC)   Acute on chronic diastolic CHF (congestive heart failure) (HCC)   Systemic inflammatory response syndrome (HCC)   Seizure (HCC)   Controlled type 2 diabetes mellitus without complication, without long-term current use of insulin (HCC)   Hypokalemia   Liver cirrhosis secondary to NASH (HCC)   Hard of hearing   Asthma   History of CVA (cerebrovascular accident)   DNR (do not resuscitate)   Aortic stenosis   Bacteremia due to Streptococcus pneumoniae   Discharge Condition: stable  Diet recommendation: heart healthy  Filed Weights   06/23/23 1425  Weight: 74.8 kg    History of present illness:  From admission h and p [name] is a 72 y.o. female with medical history significant of CHF, Nash cirrhosis, COPD on chronic oxygen, hard of hearing, depression, diabetes, GERD, hypertension, rheumatoid arthritis, seizures.  She presents with shortness of breath chest pain cough wheezing and coughing up whitish sputum and some nausea and vomiting and some diarrhea.  Patient had respiratory distress and a fever in the emergency room.  COVID test pending.  Patient feeling better on BiPAP.  Nursing staff noticed a seizure lasting about 15 seconds and was a little bit altered.  When I came in the room she was talking to me coherently and did not recall that she had a seizure.  Hospitalist services contacted for further evaluation.  Admitting with systemic inflammatory response syndrome, respiratory distress and seizure.   Hospital Course:  Patient presents with dyspnea and  SIRS signs including fever. Blood culture positive for strep pneumonia, likely pulmonary source given copd. No infiltrate on CT. Treated with ceftriaxone that has been transitioned to amoxicillin (she has tolerated this in the past and we gave a test dose prior to d/c). Seen by ID who advises a 7 day course. Dyspnea resolved, respiratory status has returned to baseline. Did have several break-through seizures here, evaluated by neurology who opines likely lowered seizure threshold from acute illness, advises increasing home vimpat from 150 to 200 bid for the next 5 days while she recovers from this acute illness. Other medical problems stable. Evaluated by PT who says she is at her baseline. Advise PCP f/u within the next week or two.  Procedures: none   Consultations: Neurology, ID  Discharge Exam: Vitals:   06/26/23 1433 06/26/23 1553  BP: (!) 143/54 (!) 167/51  Pulse: 71 (!) 56  Resp: 20 16  Temp: 98 F (36.7 C) 98 F (36.7 C)  SpO2: 98% 96%    General: NAD Cardiovascular: RRR Respiratory: CTAB  Discharge Instructions   Discharge Instructions     Diet - low sodium heart healthy   Complete by: As directed    Increase activity slowly   Complete by: As directed       Allergies as of 06/26/2023       Reactions   Celebrex [celecoxib] Itching   itching   Ciprofloxacin Itching   Codeine Itching   Fosphenytoin Itching, Other (See Comments)   Levaquin [levofloxacin In D5w] Itching   Levofloxacin Itching   Lovastatin Itching   Pravastatin  Itching   Sulfa Antibiotics Itching   Aspirin Itching, Rash   Azithromycin Rash   Penicillins Rash   Documentation indicates severe reaction Pt tolerated cephalosporin without adverse reaction 09/18        Medication List     TAKE these medications    amoxicillin 500 MG tablet Commonly known as: AMOXIL Take 2 tablets (1,000 mg total) by mouth 3 (three) times daily for 3 days.   Breo Ellipta 100-25 MCG/ACT Aepb Generic  drug: fluticasone furoate-vilanterol Inhale 1 puff into the lungs daily.   citalopram 10 MG tablet Commonly known as: CELEXA TAKE 1 TABLET BY MOUTH DAILY   clopidogrel 75 MG tablet Commonly known as: PLAVIX TAKE 1 TABLET BY MOUTH DAILY   fluticasone 50 MCG/ACT nasal spray Commonly known as: FLONASE Place 1 spray into both nostrils 2 (two) times daily.   furosemide 40 MG tablet Commonly known as: LASIX Take 1 tablet (40 mg total) by mouth daily.   gabapentin 100 MG capsule Commonly known as: NEURONTIN Take 2 capsules (200 mg total) by mouth at bedtime.   glimepiride 2 MG tablet Commonly known as: AMARYL TAKE 1 TABLET BY MOUTH DAILY FOR DIABETES   ipratropium-albuterol 0.5-2.5 (3) MG/3ML Soln Commonly known as: DUONEB INHALE 1 VIAL USING NEBULIZER EVERY SIX HOURS AS NEEDED FOR SHORTNESS OF BREATH   isosorbide mononitrate 30 MG 24 hr tablet Commonly known as: IMDUR Take 30 mg by mouth daily.   losartan 100 MG tablet Commonly known as: COZAAR TAKE 1 TABLET BY MOUTH DAILY AS DIRECTED   metoprolol succinate 25 MG 24 hr tablet Commonly known as: TOPROL-XL TAKE 1 TABLET BY MOUTH DAILY   montelukast 10 MG tablet Commonly known as: SINGULAIR TAKE 1 TABLET BY MOUTH AT BEDTIME   pantoprazole 40 MG tablet Commonly known as: PROTONIX TAKE 1 TABLET BY MOUTH DAILY FOR REFLUX/HEARTBURN   potassium chloride 10 MEQ tablet Commonly known as: KLOR-CON TAKE 2 TABLETS BY MOUTH DAILY   rOPINIRole 0.5 MG tablet Commonly known as: REQUIP Take 1 tablet (0.5 mg total) by mouth at bedtime.   rosuvastatin 10 MG tablet Commonly known as: CRESTOR TAKE 1 TABLET BY MOUTH AT BEDTIME   spironolactone 25 MG tablet Commonly known as: ALDACTONE TAKE 1 TABLET BY MOUTH EVERY MORNING   traMADol 50 MG tablet Commonly known as: ULTRAM Take 1 tablet (50 mg total) by mouth every 12 (twelve) hours as needed for moderate pain or severe pain.   Vimpat 150 MG Tabs Generic drug: Lacosamide Take  150 mg by mouth every 12 (twelve) hours. What changed: Another medication with the same name was added. Make sure you understand how and when to take each.   lacosamide 50 MG Tabs tablet Commonly known as: Vimpat Take twice a day along with the 150 mg tabs for 5 days What changed: You were already taking a medication with the same name, and this prescription was added. Make sure you understand how and when to take each.       Allergies  Allergen Reactions   Celebrex [Celecoxib] Itching    itching   Ciprofloxacin Itching   Codeine Itching   Fosphenytoin Itching and Other (See Comments)   Levaquin [Levofloxacin In D5w] Itching   Levofloxacin Itching   Lovastatin Itching   Pravastatin Itching   Sulfa Antibiotics Itching   Aspirin Itching and Rash   Azithromycin Rash   Penicillins Rash    Documentation indicates severe reaction  Pt tolerated cephalosporin without adverse reaction 09/18  Follow-up Information     [name], NP Follow up.   Specialty: Nurse Practitioner Contact information: [address] Dr Laurell Josephs [address] [place] [address] [phone]                  The results of significant diagnostics from this hospitalization (including imaging, microbiology, ancillary and laboratory) are listed below for reference.    Significant Diagnostic Studies: CT Angio Chest Pulmonary Embolism (PE) W or WO Contrast Result Date: 06/24/2023 CLINICAL DATA:  Load immediate probability for acute pulmonary embolism. Positive D-dimer. Fever. Dyspnea. EXAM: CT ANGIOGRAPHY CHEST WITH CONTRAST TECHNIQUE: Multidetector CT imaging of the chest was performed using the standard protocol during bolus administration of intravenous contrast. Multiplanar CT image reconstructions and MIPs were obtained to evaluate the vascular anatomy. RADIATION DOSE REDUCTION: This exam was performed according to the departmental dose-optimization program which includes automated exposure control,  adjustment of the mA and/or kV according to patient size and/or use of iterative reconstruction technique. CONTRAST:  75mL OMNIPAQUE IOHEXOL 350 MG/ML SOLN COMPARISON:  03/25/2023 FINDINGS: Cardiovascular: No filling defects within the pulmonary arteries to suggest acute pulmonary embolism. Mediastinum/Nodes: No axillary or supraclavicular adenopathy. No mediastinal or hilar adenopathy. No pericardial fluid. Esophagus normal. Lungs/Pleura: Atelectasis in the RIGHT middle lobe and lingula. Mild ground-glass densities suggest pulmonary edema. Evidence pneumonia. No pulmonary infarction Upper Abdomen: Limited view of the liver, kidneys, pancreas are unremarkable. Normal adrenal glands. Musculoskeletal: No aggressive osseous lesion. Review of the MIP images confirms the above findings. IMPRESSION: 1. No evidence acute pulmonary embolism. 2. Mild pulmonary edema. 3. Atelectasis in the RIGHT middle lobe and lingula. Electronically Signed   By: Genevive Bi M.D.   On: 06/24/2023 12:27   DG Chest Portable 1 View Result Date: 06/23/2023 CLINICAL DATA:  COPD.  CHF. EXAM: PORTABLE CHEST 1 VIEW COMPARISON:  04/14/2023 FINDINGS: The heart size and mediastinal contours are within normal limits. Both lungs are clear. Status post right shoulder arthroplasty. IMPRESSION: No active disease. Electronically Signed   By: Signa Kell M.D.   On: 06/23/2023 06:38    Microbiology: Recent Results (from the past 240 hours)  Blood Culture (routine x 2)     Status: None (Preliminary result)   Collection Time: 06/23/23  4:25 AM   Specimen: BLOOD  Result Value Ref Range Status   Specimen Description BLOOD LEFT FOREARM  Final   Special Requests   Final    BOTTLES DRAWN AEROBIC AND ANAEROBIC Blood Culture results may not be optimal due to an inadequate volume of blood received in culture bottles   Culture   Final    NO GROWTH 3 DAYS Performed at [facility], [address] [place], [place] [address]    Report  Status PENDING  Incomplete  Blood Culture (routine x 2)     Status: Abnormal   Collection Time: 06/23/23  4:25 AM   Specimen: BLOOD  Result Value Ref Range Status   Specimen Description   Final    BLOOD LEFT ASSIST CONTROL Performed at [facility], [address] [place], [place] [address]    Special Requests   Final    BOTTLES DRAWN AEROBIC AND ANAEROBIC Blood Culture results may not be optimal due to an inadequate volume of blood received in culture bottles Performed at [facility], [address] [place], [place] [address]    Culture  Setup Time   Final    GRAM POSITIVE COCCI ANAEROBIC BOTTLE ONLY CRITICAL RESULT CALLED TO, READ BACK  BY AND VERIFIED WITH: ALEX CHAPPELL AT 2141 ON 06/23/23 BY SS Performed at [facility], [address], [place], [place] [address]    Culture STREPTOCOCCUS PNEUMONIAE (A)  Final   Report Status 06/26/2023 FINAL  Final   Organism ID, Bacteria STREPTOCOCCUS PNEUMONIAE  Final      Susceptibility   Streptococcus pneumoniae - MIC*    ERYTHROMYCIN <=0.12 SENSITIVE Sensitive     LEVOFLOXACIN 0.5 SENSITIVE Sensitive     VANCOMYCIN 0.5 SENSITIVE Sensitive     PENICILLIN (meningitis) <=0.06 SENSITIVE Sensitive     PENO - penicillin <=0.06      PENICILLIN (non-meningitis) <=0.06 SENSITIVE Sensitive     PENICILLIN (oral) <=0.06 SENSITIVE Sensitive     CEFTRIAXONE (non-meningitis) <=0.12 SENSITIVE Sensitive     CEFTRIAXONE (meningitis) <=0.12 SENSITIVE Sensitive     * STREPTOCOCCUS PNEUMONIAE  Resp panel by RT-PCR (RSV, Flu A&B, Covid) Anterior Nasal Swab     Status: None   Collection Time: 06/23/23  4:25 AM   Specimen: Anterior Nasal Swab  Result Value Ref Range Status   SARS Coronavirus 2 by RT PCR NEGATIVE NEGATIVE Final    Comment: (NOTE) SARS-CoV-2 target nucleic acids are NOT DETECTED.  The SARS-CoV-2 RNA is generally detectable in upper respiratory specimens during the acute phase of infection. The  lowest concentration of SARS-CoV-2 viral copies this assay can detect is 138 copies/mL. A negative result does not preclude SARS-Cov-2 infection and should not be used as the sole basis for treatment or other patient management decisions. A negative result may occur with  improper specimen collection/handling, submission of specimen other than nasopharyngeal swab, presence of viral mutation(s) within the areas targeted by this assay, and inadequate number of viral copies(<138 copies/mL). A negative result must be combined with clinical observations, patient history, and epidemiological information. The expected result is Negative.  Fact Sheet for Patients:  BloggerCourse.com  Fact Sheet for Healthcare Providers:  SeriousBroker.it  This test is no t yet approved or cleared by the [place] FDA and  has been authorized for detection and/or diagnosis of SARS-CoV-2 by FDA under an Emergency Use Authorization (EUA). This EUA will remain  in effect (meaning this test can be used) for the duration of the COVID-19 declaration under Section 564(b)(1) of the Act, 21 U.S.C.section 360bbb-3(b)(1), unless the authorization is terminated  or revoked sooner.       Influenza A by PCR NEGATIVE NEGATIVE Final   Influenza B by PCR NEGATIVE NEGATIVE Final    Comment: (NOTE) The Xpert Xpress SARS-CoV-2/FLU/RSV plus assay is intended as an aid in the diagnosis of influenza from Nasopharyngeal swab specimens and should not be used as a sole basis for treatment. Nasal washings and aspirates are unacceptable for Xpert Xpress SARS-CoV-2/FLU/RSV testing.  Fact Sheet for Patients: BloggerCourse.com  Fact Sheet for Healthcare Providers: SeriousBroker.it  This test is not yet approved or cleared by the [place] FDA and has been authorized for detection and/or diagnosis of SARS-CoV-2 by FDA under  an Emergency Use Authorization (EUA). This EUA will remain in effect (meaning this test can be used) for the duration of the COVID-19 declaration under Section 564(b)(1) of the Act, 21 U.S.C. section 360bbb-3(b)(1), unless the authorization is terminated or revoked.     Resp Syncytial Virus by PCR NEGATIVE NEGATIVE Final    Comment: (NOTE) Fact Sheet for Patients: BloggerCourse.com  Fact Sheet for Healthcare Providers: SeriousBroker.it  This test is not yet approved or cleared by the [place] and  has been authorized for detection and/or diagnosis of SARS-CoV-2 by FDA under an Emergency Use Authorization (EUA). This EUA will remain in effect (meaning this test can be used) for the duration of the COVID-19 declaration under Section 564(b)(1) of the Act, 21 U.S.C. section 360bbb-3(b)(1), unless the authorization is terminated or revoked.  Performed at [facility] [address] [place], [place] [address]   Blood Culture ID Panel (Reflexed)     Status: Abnormal   Collection Time: 06/23/23  4:25 AM  Result Value Ref Range Status   Enterococcus faecalis NOT DETECTED NOT DETECTED Final   Enterococcus Faecium NOT DETECTED NOT DETECTED Final   Listeria monocytogenes NOT DETECTED NOT DETECTED Final   Staphylococcus species NOT DETECTED NOT DETECTED Final   Staphylococcus aureus (BCID) NOT DETECTED NOT DETECTED Final   Staphylococcus epidermidis NOT DETECTED NOT DETECTED Final   Staphylococcus lugdunensis NOT DETECTED NOT DETECTED Final   Streptococcus species DETECTED (A) NOT DETECTED Final    Comment: CRITICAL RESULT CALLED TO, READ BACK BY AND VERIFIED WITH: ALEX CHAPPELL AT 2141 ON 06/23/23 BY SS    Streptococcus agalactiae NOT DETECTED NOT DETECTED Final   Streptococcus pneumoniae DETECTED (A) NOT DETECTED Final    Comment: CRITICAL RESULT CALLED TO, READ BACK BY AND VERIFIED WITH: ALEX CHAPPELL AT 2141 ON  06/23/23 BY SS    Streptococcus pyogenes NOT DETECTED NOT DETECTED Final   A.calcoaceticus-baumannii NOT DETECTED NOT DETECTED Final   Bacteroides fragilis NOT DETECTED NOT DETECTED Final   Enterobacterales NOT DETECTED NOT DETECTED Final   Enterobacter cloacae complex NOT DETECTED NOT DETECTED Final   Escherichia coli NOT DETECTED NOT DETECTED Final   Klebsiella aerogenes NOT DETECTED NOT DETECTED Final   Klebsiella oxytoca NOT DETECTED NOT DETECTED Final   Klebsiella pneumoniae NOT DETECTED NOT DETECTED Final   Proteus species NOT DETECTED NOT DETECTED Final   Salmonella species NOT DETECTED NOT DETECTED Final   Serratia marcescens NOT DETECTED NOT DETECTED Final   Haemophilus influenzae NOT DETECTED NOT DETECTED Final   Neisseria meningitidis NOT DETECTED NOT DETECTED Final   Pseudomonas aeruginosa NOT DETECTED NOT DETECTED Final   Stenotrophomonas maltophilia NOT DETECTED NOT DETECTED Final   Candida albicans NOT DETECTED NOT DETECTED Final   Candida auris NOT DETECTED NOT DETECTED Final   Candida glabrata NOT DETECTED NOT DETECTED Final   Candida krusei NOT DETECTED NOT DETECTED Final   Candida parapsilosis NOT DETECTED NOT DETECTED Final   Candida tropicalis NOT DETECTED NOT DETECTED Final   Cryptococcus neoformans/gattii NOT DETECTED NOT DETECTED Final    Comment: Performed at [facility], [address] [place], [place] [address]  Urine Culture (for pregnant, neutropenic or urologic patients or patients with an indwelling urinary catheter)     Status: Abnormal   Collection Time: 06/23/23  7:29 AM   Specimen: Urine, Clean Catch  Result Value Ref Range Status   Specimen Description   Final    URINE, CLEAN CATCH Performed at [facility], [address] [place], [place] [address]    Special Requests   Final    Normal Performed at [facility] [address] [place], [place] [address]    Culture MULTIPLE SPECIES PRESENT, SUGGEST  RECOLLECTION (A)  Final   Report Status 06/24/2023 FINAL  Final  Respiratory (~20 pathogens) panel by PCR     Status: None   Collection Time: 06/23/23  8:49 AM   Specimen: Nasopharyngeal Swab; Respiratory  Result Value Ref Range Status  Adenovirus NOT DETECTED NOT DETECTED Final   Coronavirus 229E NOT DETECTED NOT DETECTED Final    Comment: (NOTE) The Coronavirus on the Respiratory Panel, DOES NOT test for the novel  Coronavirus (2019 nCoV)    Coronavirus HKU1 NOT DETECTED NOT DETECTED Final   Coronavirus NL63 NOT DETECTED NOT DETECTED Final   Coronavirus OC43 NOT DETECTED NOT DETECTED Final   Metapneumovirus NOT DETECTED NOT DETECTED Final   Rhinovirus / Enterovirus NOT DETECTED NOT DETECTED Final   Influenza A NOT DETECTED NOT DETECTED Final   Influenza B NOT DETECTED NOT DETECTED Final   Parainfluenza Virus 1 NOT DETECTED NOT DETECTED Final   Parainfluenza Virus 2 NOT DETECTED NOT DETECTED Final   Parainfluenza Virus 3 NOT DETECTED NOT DETECTED Final   Parainfluenza Virus 4 NOT DETECTED NOT DETECTED Final   Respiratory Syncytial Virus NOT DETECTED NOT DETECTED Final   Bordetella pertussis NOT DETECTED NOT DETECTED Final   Bordetella Parapertussis NOT DETECTED NOT DETECTED Final   Chlamydophila pneumoniae NOT DETECTED NOT DETECTED Final   Mycoplasma pneumoniae NOT DETECTED NOT DETECTED Final    Comment: Performed at [facility], [address] [place], [place] [address]  Expectorated Sputum Assessment w Gram Stain, Rflx to Resp Cult     Status: None   Collection Time: 06/24/23  4:10 PM   Specimen: Sputum  Result Value Ref Range Status   Specimen Description SPUTUM  Final   Special Requests EXPSU  Final   Sputum evaluation   Final    THIS SPECIMEN IS ACCEPTABLE FOR SPUTUM CULTURE Performed at [facility], [address] [place], [place] [address]    Report Status 06/24/2023 FINAL  Final  Culture, Respiratory w Gram Stain     Status: None (Preliminary  result)   Collection Time: 06/24/23  4:10 PM   Specimen: SPU  Result Value Ref Range Status   Specimen Description   Final    SPUTUM Performed at [facility], [address] [place], [place] [address]    Special Requests   Final    EXPSU Reflexed from [id number] Performed at [facility], [address] [place], [place] [address]    Gram Stain   Final    RARE WBC PRESENT, PREDOMINANTLY PMN FEW GRAM POSITIVE COCCI    Culture   Final    MODERATE STENOTROPHOMONAS MALTOPHILIA SUSCEPTIBILITIES TO FOLLOW Performed at [facility], [address] [place], [place] [address]    Report Status PENDING  Incomplete  Culture, blood (Routine X 2) w Reflex to ID Panel     Status: None (Preliminary result)   Collection Time: 06/25/23 11:21 AM   Specimen: BLOOD  Result Value Ref Range Status   Specimen Description BLOOD BLOOD LEFT HAND  Final   Special Requests   Final    BOTTLES DRAWN AEROBIC AND ANAEROBIC Blood Culture adequate volume   Culture   Final    NO GROWTH < 24 HOURS Performed at [facility], [address], [place], [place] [address]    Report Status PENDING  Incomplete  Culture, blood (Routine X 2) w Reflex to ID Panel     Status: None (Preliminary result)   Collection Time: 06/25/23 11:21 AM   Specimen: BLOOD  Result Value Ref Range Status   Specimen Description BLOOD RIGHT ANTECUBITAL  Final   Special Requests   Final    BOTTLES DRAWN AEROBIC AND ANAEROBIC Blood Culture adequate volume   Culture   Final  NO GROWTH < 24 HOURS Performed at [facility], [address] [place] [place], [place] [address]    Report Status PENDING  Incomplete     Labs: Basic Metabolic Panel: Recent Labs  Lab 06/23/23 0415 06/23/23 0425 06/24/23 0603 06/25/23 0837 06/26/23 0551  NA 140  --  138 138 142  K 3.1*  --  3.7 3.8 3.6  CL 100  --  103 102 106  CO2 26  --  26 25 28   GLUCOSE 211*  --  234* 231* 87  BUN 13  --  22 21 19   CREATININE  0.69  --  0.76 0.77 0.67  CALCIUM 8.9  --  8.5* 8.6* 8.5*  MG  --  1.3*  --  2.0  --    Liver Function Tests: Recent Labs  Lab 06/23/23 0415  AST 26  ALT 14  ALKPHOS 89  BILITOT 0.6  PROT 6.9  ALBUMIN 3.7   No results for input(s): "LIPASE", "AMYLASE" in the last 168 hours. No results for input(s): "AMMONIA" in the last 168 hours. CBC: Recent Labs  Lab 06/23/23 0415 06/24/23 0603  WBC 13.2* 15.0*  NEUTROABS 10.0*  --   HGB 14.4 12.3  HCT 43.9 37.5  MCV 85.6 84.7  PLT 248 203   Cardiac Enzymes: Recent Labs  Lab 06/23/23 0425  CKTOTAL 50   BNP: BNP (last 3 results) Recent Labs    02/24/23 1117 06/23/23 0425  BNP 281.2* 79.5    ProBNP (last 3 results) No results for input(s): "PROBNP" in the last 8760 hours.  CBG: Recent Labs  Lab 06/25/23 0830 06/25/23 1204 06/25/23 1604 06/25/23 2135 06/26/23 1213  GLUCAP 250* 438* 298* 131* 207*       Signed:  Silvano Bilis MD.  [facility] 06/26/2023, 4:53 PM

## 2023-06-26 NOTE — Inpatient Diabetes Management (Signed)
Inpatient Diabetes Program Recommendations  AACE/ADA: New Consensus Statement on Inpatient Glycemic Control  Target Ranges:  Prepandial:   less than 140 mg/dL      Peak postprandial:   less than 180 mg/dL (1-2 hours)      Critically ill patients:  140 - 180 mg/dL    Review of Glycemic Control  Latest Reference Range & Units 06/25/23 08:30 06/25/23 12:04 06/25/23 16:04 06/25/23 21:35  Glucose-Capillary 70 - 99 mg/dL 952 (H) 841 (H) 324 (H) 131 (H)   Diabetes history: DM2 Outpatient Diabetes medications: Amaryl 2 mg daily Current orders for Inpatient glycemic control:  Semglee 15 units daily at bedtime Novolog 0-20 units TID + HS  Solumedrol 40 mg Q12H changed to now PO prednisone 40 mg Daily  Note: glucose trends increased into the 400 range after steroid and PO intake.  Inpatient Diabetes Program Recommendations:    -    consider ordering Novolog 4 units TID with meals for meal coverage if patient eats at least 50% of meals.  Thanks, Christena Deem RN, MSN, BC-ADM Inpatient Diabetes Coordinator Team Pager [phone] (8a-5p)

## 2023-06-26 NOTE — Final Progress Note (Signed)
Pulmonary Medicine          Date: 06/26/2023,   MRN# [id number] [name] Jun 15, 1951     AdmissionWeight: 74.8 kg                 CurrentWeight: 74.8 kg      HISTORY OF PRESENT ILLNESS   Residual cough present strep pn. In bloos 1/4 bottles. No fever rashes, ID following.  Copd wheeze less wheezing, sob better. Able to gp home with out patient follow up visit. Marland Kitchen    PAST MEDICAL HISTORY   Past Medical History:  Diagnosis Date   Asthma    CHF (congestive heart failure) (HCC)    Cirrhosis, non-alcoholic (HCC)    COPD (chronic obstructive pulmonary disease) (HCC)    Deaf    Depression    Diabetes mellitus without complication (HCC)    GERD (gastroesophageal reflux disease)    Heart murmur    Hepatitis    History of rheumatic fever    History of scarlet fever    Hypertension    IBS (irritable bowel syndrome)    Lymph node disorder    arm   Neuropathy    On home oxygen therapy    hs   Orthopnea    Osteoarthritis    RA (rheumatoid arthritis) (HCC)    RLS (restless legs syndrome)    Seizures (HCC)    Shortness of breath dyspnea    Sleep apnea    Stroke (HCC)    tia     SURGICAL HISTORY   Past Surgical History:  Procedure Laterality Date   ABDOMINAL HYSTERECTOMY     BREAST BIOPSY Right 10/30/2020   Stereo Bx, X-clip,  BENIGN BREAST TISSUE WITH FIBROADENOMATOUS   CATARACT EXTRACTION W/PHACO Right 11/23/2014   Procedure: CATARACT EXTRACTION PHACO AND INTRAOCULAR LENS PLACEMENT (IOC);  Surgeon: Lia Hopping, MD;  Location: [facility];  Service: Ophthalmology;  Laterality: Right;   CATARACT EXTRACTION W/PHACO Left 12/14/2014   Procedure: CATARACT EXTRACTION PHACO AND INTRAOCULAR LENS PLACEMENT (IOC);  Surgeon: Lia Hopping, MD;  Location: [facility];  Service: Ophthalmology;  Laterality: Left;  US:01:16.6 AP:15.8 CDE:12.14   CESAREAN SECTION     CHOLECYSTECTOMY     COLONOSCOPY WITH PROPOFOL N/A 10/08/2020   Procedure: COLONOSCOPY WITH  PROPOFOL;  Surgeon: Toledo, Boykin Nearing, MD;  Location: [facility];  Service: Gastroenterology;  Laterality: N/A;   ESOPHAGOGASTRODUODENOSCOPY (EGD) WITH PROPOFOL N/A 10/08/2020   Procedure: ESOPHAGOGASTRODUODENOSCOPY (EGD) WITH PROPOFOL;  Surgeon: Toledo, Boykin Nearing, MD;  Location: [facility];  Service: Gastroenterology;  Laterality: N/A;  DM DEAF, NEEDS SIGN INTERPRETER PER SON   REVERSE SHOULDER ARTHROPLASTY Right 12/21/2019   Procedure: REVERSE SHOULDER ARTHROPLASTY;  Surgeon: Lyndle Herrlich, MD;  Location: [facility];  Service: Orthopedics;  Laterality: Right;   THUMB ARTHROSCOPY     TONSILLECTOMY     TYMPANOPLASTY     muliple     FAMILY HISTORY   Family History  Problem Relation Age of Onset   Lung cancer Mother    CAD Father    Miscarriages / Stillbirths Sister    Hyperlipidemia Sister    Hypertension Sister    Diabetes Sister    COPD Sister    Asthma Sister    Arthritis Sister    Breast cancer Sister    Heart attack Sister    Hyperlipidemia Sister    Heart disease Sister    Heart attack Sister    Diabetes Sister    Cancer Sister  Asthma Sister    Heart disease Brother    Heart attack Brother    Depression Brother    Hypertension Son    Hyperlipidemia Son    Heart disease Son    Depression Son    Hypertension Daughter    Depression Daughter    Asthma Daughter    Birth defects Paternal Uncle    Hearing loss Brother      SOCIAL HISTORY   Social History   Tobacco Use   Smoking status: Every Day    Current packs/day: 0.50    Average packs/day: 0.5 packs/day for 55.3 years (27.7 ttl pk-yrs)    Types: Cigarettes    Start date: 03/01/1968   Smokeless tobacco: Never  Vaping Use   Vaping status: Former  Substance Use Topics   Alcohol use: No   Drug use: No     MEDICATIONS    Home Medication:    Current Medication:  Current Facility-Administered Medications:    amoxicillin (AMOXIL) capsule 1,000 mg, 1,000 mg, Oral, STAT, Wouk, Wilfred Curtis, MD   cefTRIAXone (ROCEPHIN) 2 g in sodium chloride 0.9 % 100 mL IVPB, 2 g, Intravenous, Q24H, Wieting, Richard, MD, Stopped at 06/26/23 0738   citalopram (CELEXA) tablet 10 mg, 10 mg, Oral, Daily, Renae Gloss, Richard, MD, 10 mg at 06/26/23 0910   clopidogrel (PLAVIX) tablet 75 mg, 75 mg, Oral, Daily, Renae Gloss, Richard, MD, 75 mg at 06/26/23 0910   enoxaparin (LOVENOX) injection 40 mg, 40 mg, Subcutaneous, Q24H, Wieting, Richard, MD, 40 mg at 06/25/23 2159   fluticasone (FLONASE) 50 MCG/ACT nasal spray 1 spray, 1 spray, Each Nare, Daily, Wieting, Richard, MD   fluticasone furoate-vilanterol (BREO ELLIPTA) 100-25 MCG/ACT 1 puff, 1 puff, Inhalation, Daily, Wieting, Richard, MD, 1 puff at 06/26/23 0915   furosemide (LASIX) injection 40 mg, 40 mg, Intravenous, Daily, Renae Gloss, Richard, MD, 40 mg at 06/26/23 1037   gabapentin (NEURONTIN) capsule 200 mg, 200 mg, Oral, QHS, Wieting, Richard, MD, 200 mg at 06/25/23 2200   insulin aspart (novoLOG) injection 0-20 Units, 0-20 Units, Subcutaneous, TID WC, Wouk, Wilfred Curtis, MD, 7 Units at 06/26/23 1235   insulin aspart (novoLOG) injection 0-5 Units, 0-5 Units, Subcutaneous, QHS, Wouk, Wilfred Curtis, MD   insulin glargine-yfgn [facility]) injection 15 Units, 15 Units, Subcutaneous, Daily, Wouk, Wilfred Curtis, MD, 15 Units at 06/26/23 0946   ipratropium-albuterol (DUONEB) 0.5-2.5 (3) MG/3ML nebulizer solution 3 mL, 3 mL, Nebulization, TID, Wouk, Wilfred Curtis, MD, 3 mL at 06/26/23 1348   lacosamide (VIMPAT) tablet 200 mg, 200 mg, Oral, Q12H, Bhagat, Srishti L, MD, 200 mg at 06/26/23 0910   LORazepam (ATIVAN) injection 1 mg, 1 mg, Intravenous, Q2H PRN, Bhagat, Srishti L, MD, 1 mg at 06/24/23 1017   metoprolol succinate (TOPROL-XL) 24 hr tablet 25 mg, 25 mg, Oral, Daily, Wieting, Richard, MD, 25 mg at 06/26/23 0913   montelukast (SINGULAIR) tablet 10 mg, 10 mg, Oral, QHS, Wieting, Richard, MD, 10 mg at 06/25/23 2159   ondansetron (ZOFRAN) tablet 4 mg, 4 mg, Oral,  Q6H PRN **OR** ondansetron (ZOFRAN) injection 4 mg, 4 mg, Intravenous, Q6H PRN, Wieting, Richard, MD   pantoprazole (PROTONIX) EC tablet 40 mg, 40 mg, Oral, Daily, Wieting, Richard, MD, 40 mg at 06/26/23 0911   potassium chloride SA (KLOR-CON M) CR tablet 20 mEq, 20 mEq, Oral, Daily, Wieting, Richard, MD, 20 mEq at 06/26/23 0912   predniSONE (DELTASONE) tablet 40 mg, 40 mg, Oral, Q breakfast, Wouk, Wilfred Curtis, MD, 40 mg at 06/26/23 [phone]  rosuvastatin (CRESTOR) tablet 10 mg, 10 mg, Oral, QHS, Wieting, Richard, MD, 10 mg at 06/25/23 2200    ALLERGIES   Celebrex [celecoxib], Ciprofloxacin, Codeine, Fosphenytoin, Levaquin [levofloxacin in d5w], Levofloxacin, Lovastatin, Pravastatin, Sulfa antibiotics, Aspirin, Azithromycin, and Penicillins     REVIEW OF SYSTEMS    Review of Systems:  Gen:  Denies  fever, sweats, chills weigh loss  HEENT: Denies blurred vision, double vision, ear pain, eye pain, hearing loss, nose bleeds, sore throat Cardiac:  No dizziness, chest pain or heaviness, chest tightness,edema Resp:   Denies cough or sputum porduction, less shortness of breath, hardly wheezing, no hemoptysis,  Gi: Denies swallowing difficulty, stomach pain, nausea or vomiting, diarrhea, constipation, bowel incontinence Gu:  Denies bladder incontinence, burning urine Ext:   Denies Joint pain, stiffness or swelling Skin: Denies  skin rash, easy bruising or bleeding or hives Endoc:  Denies polyuria, polydipsia , polyphagia or weight change Psych:   Denies depression, insomnia or hallucinations   Other:  All other systems negative   VS: BP (!) 143/54 (BP Location: Left Arm)   Pulse 71   Temp 98 F (36.7 C) (Oral)   Resp 20   Ht 5\' 3"  (1.6 m)   Wt 74.8 kg   SpO2 98%   BMI 29.23 kg/m      PHYSICAL EXAM    GENERAL:NAD, no fevers, chills, no weakness no fatigue family in room with ID HEAD: Normocephalic, atraumatic.  EYES: Pupils equal, round, reactive to light. Extraocular muscles  intact. No scleral icterus.  MOUTH: Moist mucosal membrane. Dentition intact. No abscess noted.  EAR, NOSE, THROAT: Clear without exudates. No external lesions.  NECK: Supple. No thyromegaly. No nodules. No JVD.  PULMONARY: moving air, no use of accessory muscles CARDIOVASCULAR: S1 and S2. Regular rate and rhythm. No murmurs, rubs, or gallops. No edema. Pedal pulses 2+ bilaterally.  GASTROINTESTINAL: Soft, nontender, nondistended. No masses. Positive bowel sounds. No hepatosplenomegaly.  MUSCULOSKELETAL: No swelling, clubbing, or edema. Range of motion full in all extremities.  NEUROLOGIC: Cranial nerves II through XII are intact. No gross focal neurological deficits. Sensation intact. Reflexes intact.  SKIN: No ulceration, lesions, rashes, or cyanosis. Skin warm and dry. Turgor intact.  PSYCHIATRIC: Mood, affect within normal limits. The patient is awake, alert and oriented x 3. Insight, judgment intact.       IMAGING    CT Angio Chest Pulmonary Embolism (PE) W or WO Contrast Result Date: 06/24/2023 CLINICAL DATA:  Load immediate probability for acute pulmonary embolism. Positive D-dimer. Fever. Dyspnea. EXAM: CT ANGIOGRAPHY CHEST WITH CONTRAST TECHNIQUE: Multidetector CT imaging of the chest was performed using the standard protocol during bolus administration of intravenous contrast. Multiplanar CT image reconstructions and MIPs were obtained to evaluate the vascular anatomy. RADIATION DOSE REDUCTION: This exam was performed according to the departmental dose-optimization program which includes automated exposure control, adjustment of the mA and/or kV according to patient size and/or use of iterative reconstruction technique. CONTRAST:  75mL OMNIPAQUE IOHEXOL 350 MG/ML SOLN COMPARISON:  03/25/2023 FINDINGS: Cardiovascular: No filling defects within the pulmonary arteries to suggest acute pulmonary embolism. Mediastinum/Nodes: No axillary or supraclavicular adenopathy. No mediastinal or hilar  adenopathy. No pericardial fluid. Esophagus normal. Lungs/Pleura: Atelectasis in the RIGHT middle lobe and lingula. Mild ground-glass densities suggest pulmonary edema. Evidence pneumonia. No pulmonary infarction Upper Abdomen: Limited view of the liver, kidneys, pancreas are unremarkable. Normal adrenal glands. Musculoskeletal: No aggressive osseous lesion. Review of the MIP images confirms the above findings. IMPRESSION: 1. No evidence acute  pulmonary embolism. 2. Mild pulmonary edema. 3. Atelectasis in the RIGHT middle lobe and lingula. Electronically Signed   By: Genevive Bi M.D.   On: 06/24/2023 12:27   DG Chest Portable 1 View Result Date: 06/23/2023 CLINICAL DATA:  COPD.  CHF. EXAM: PORTABLE CHEST 1 VIEW COMPARISON:  04/14/2023 FINDINGS: The heart size and mediastinal contours are within normal limits. Both lungs are clear. Status post right shoulder arthroplasty. IMPRESSION: No active disease. Electronically Signed   By: Signa Kell M.D.   On: 06/23/2023 06:38      ASSESSMENT/PLAN   Here with a copd flare. Mild pulm edema ( hx of mod as, diastolic dysfunction).  No signs of pneumonia initially  Presented with sirs. With hydration she may blossom. On rocephin. Step pn. In blood. ID following.  Elevatred d-dimer. Chest cy angio no pe.  -pulm wise ccan go -see pulm in 2 weeks -pred taper over 7 days or so -resume her routine copd meds -d/c antibiotics per ID        Thank you for allowing me to participate in the care of this patient.   Patient/Family are satisfied with care plan and all questions have been answered.  This document was prepared using Dragon voice recognition software and may include unintentional dictation errors.     Ned Clines, M.D.  Division of Pulmonary & Critical Care Medicine  [facility]

## 2023-06-26 NOTE — Progress Notes (Signed)
Date of Admission:  06/23/2023      ID: [name] is a 72 y.o. female   Principal Problem:   Respiratory distress Active Problems:   Controlled type 2 diabetes mellitus without complication, without long-term current use of insulin (HCC)   Seizure (HCC)   Hypokalemia   Seizure disorder (HCC)   Acute on chronic diastolic CHF (congestive heart failure) (HCC)   Hard of hearing   Liver cirrhosis secondary to NASH (HCC)   Asthma   History of CVA (cerebrovascular accident)   COPD with acute exacerbation (HCC)   Acute respiratory failure with hypoxia (HCC)   Systemic inflammatory response syndrome (HCC)   DNR (do not resuscitate)   Aortic stenosis   Bacteremia due to Streptococcus pneumoniae   Son at bed side Subjective: Says she is feeling better No fever Breathing better Some cough  Medications:   citalopram  10 mg Oral Daily   clopidogrel  75 mg Oral Daily   enoxaparin (LOVENOX) injection  40 mg Subcutaneous Q24H   fluticasone  1 spray Each Nare Daily   fluticasone furoate-vilanterol  1 puff Inhalation Daily   furosemide  40 mg Intravenous Daily   gabapentin  200 mg Oral QHS   insulin aspart  0-20 Units Subcutaneous TID WC   insulin aspart  0-5 Units Subcutaneous QHS   insulin glargine-yfgn  15 Units Subcutaneous Daily   ipratropium-albuterol  3 mL Nebulization TID   lacosamide  200 mg Oral Q12H   metoprolol succinate  25 mg Oral Daily   montelukast  10 mg Oral QHS   pantoprazole  40 mg Oral Daily   potassium chloride SA  20 mEq Oral Daily   predniSONE  40 mg Oral Q breakfast   rosuvastatin  10 mg Oral QHS    Objective: Vital signs in last 24 hours: Patient Vitals for the past 24 hrs:  BP Temp Temp src Pulse Resp SpO2  06/26/23 1553 (!) 167/51 98 F (36.7 C) Oral (!) 56 16 96 %  06/26/23 1433 (!) 143/54 98 F (36.7 C) Oral 71 20 98 %  06/26/23 1219 125/77 98 F (36.7 C) Oral (!) 58 (!) 22 93 %  06/26/23 0933 124/64 97.6 F (36.4 C) Oral 64 20 96 %   06/26/23 0824 (!) 154/59 98.4 F (36.9 C) Oral (!) 57 20 96 %  06/26/23 0445 (!) 142/71 97.7 F (36.5 C) Oral (!) 57 20 95 %  06/26/23 0031 (!) 155/105 97.9 F (36.6 C) Oral 64 (!) 21 93 %  06/25/23 2031 -- -- -- -- -- 98 %  06/25/23 2028 (!) 158/51 98.2 F (36.8 C) Oral 61 19 96 %      PHYSICAL EXAM:  General: Alert, cooperative, no distress, appears stated age.  Back: No CVA tenderness. Lungs: b/l air entry Heart: Regular rate and rhythm, no murmur, rub or gallop. Abdomen: Soft, non-tender,not distended. Bowel sounds normal. No masses Extremities: atraumatic, no cyanosis. No edema. No clubbing Skin: No rashes or lesions. Or bruising Lymph: Cervical, supraclavicular normal. Neurologic: Grossly non-focal  Lab Results    Latest Ref Rng & Units 06/26/2023    5:10 PM 06/24/2023    6:03 AM 06/23/2023    4:15 AM  CBC  WBC 4.0 - 10.5 K/uL 6.5  15.0  13.2   Hemoglobin 12.0 - 15.0 g/dL 16.1  09.6  04.5   Hematocrit 36.0 - 46.0 % 38.8  37.5  43.9   Platelets 150 - 400 K/uL 204  203  248        Latest Ref Rng & Units 06/26/2023    5:51 AM 06/25/2023    8:37 AM 06/24/2023    6:03 AM  CMP  Glucose 70 - 99 mg/dL 87  960  454   BUN 8 - 23 mg/dL 19  21  22    Creatinine 0.44 - 1.00 mg/dL 0.98  1.19  1.47   Sodium 135 - 145 mmol/L 142  138  138   Potassium 3.5 - 5.1 mmol/L 3.6  3.8  3.7   Chloride 98 - 111 mmol/L 106  102  103   CO2 22 - 32 mmol/L 28  25  26    Calcium 8.9 - 10.3 mg/dL 8.5  8.6  8.5       Microbiology: [facility]- 12/17 1 strep pneumo BC 12 19 NG Sputum culture 12/18 P Studies/Results: No results found.    Assessment/Plan: Strep pneumo bacteremia Source likely to be the lung or upper resp tract Currently on ceftriaxone - pan sensiitve will change to Amoxicillin 1 gram TID for total of 10 days Pt has multiple antibiotic allergies listed - tolerates amoxicillin well  COPD exacerbation- started solumedrol/nebs   CHF - on  lasix   Seizure disorder on  lacosamide   Discussed the management with the patient , her son and the hospitalist Will follow her as OP

## 2023-06-26 NOTE — Plan of Care (Signed)
  Problem: Education: Goal: Ability to describe self-care measures that may prevent or decrease complications (Diabetes Survival Skills Education) will improve Outcome: Progressing   Problem: Coping: Goal: Ability to adjust to condition or change in health will improve Outcome: Progressing   Problem: Fluid Volume: Goal: Ability to maintain a balanced intake and output will improve Outcome: Progressing   Problem: Health Behavior/Discharge Planning: Goal: Ability to identify and utilize available resources and services will improve Outcome: Progressing Goal: Ability to manage health-related needs will improve Outcome: Progressing   Problem: Nutritional: Goal: Maintenance of adequate nutrition will improve Outcome: Progressing Goal: Progress toward achieving an optimal weight will improve Outcome: Progressing   Problem: Skin Integrity: Goal: Risk for impaired skin integrity will decrease Outcome: Progressing   Problem: Tissue Perfusion: Goal: Adequacy of tissue perfusion will improve Outcome: Progressing

## 2023-06-27 ENCOUNTER — Encounter: Payer: Self-pay | Admitting: Infectious Diseases

## 2023-06-27 LAB — CULTURE, RESPIRATORY W GRAM STAIN

## 2023-06-28 LAB — CULTURE, BLOOD (ROUTINE X 2): Culture: NO GROWTH

## 2023-06-29 ENCOUNTER — Telehealth: Payer: Self-pay

## 2023-06-29 MED ORDER — AMOXICILLIN 500 MG PO TABS: 1000.0000 mg | ORAL_TABLET | Freq: Three times a day (TID) | ORAL | 0 refills | Status: AC

## 2023-06-29 NOTE — Transitions of Care (Post Inpatient/ED Visit) (Signed)
06/29/2023  Name: [name] MRN: [id number] DOB: July 12, 1950  Today's TOC FU Call Status: Today's TOC FU Call Status:: Successful TOC FU Call Completed TOC FU Call Complete Date: 06/29/23 Patient's Name and Date of Birth confirmed.  Transition Care Management Follow-up Telephone Call Date of Discharge: 06/26/23 Discharge Facility: [facility]) Type of Discharge: Inpatient Admission Primary Inpatient Discharge Diagnosis:: COPD How have you been since you were released from the hospital?: Better Any questions or concerns?: No  Items Reviewed: Did you receive and understand the discharge instructions provided?: Yes Medications obtained,verified, and reconciled?: Yes (Medications Reviewed) Any new allergies since your discharge?: No Dietary orders reviewed?: NA Do you have support at home?: Yes People in Home: child(ren), dependent Name of Support/Comfort Primary Source: [name], son  Medications Reviewed Today: Medications Reviewed Today     Reviewed by Redge Gainer, RN (Case Manager) on 06/29/23 at 1415  Med List Status: <None>   Medication Order Taking? Sig Documenting Provider Last Dose Status Informant  amoxicillin (AMOXIL) 500 MG tablet [id number]  Take 2 tablets (1,000 mg total) by mouth 3 (three) times daily for 4 days. Lynn Ito, MD  Active   citalopram (CELEXA) 10 MG tablet [id number] No TAKE 1 TABLET BY MOUTH DAILY [name], NP 06/22/2023 Active Child, Pharmacy Records, Multiple Informants  clopidogrel (PLAVIX) 75 MG tablet [id number] No TAKE 1 TABLET BY MOUTH DAILY [name], NP 06/22/2023 Active Child, Pharmacy Records, Multiple Informants  fluticasone (FLONASE) 50 MCG/ACT nasal spray [id number] No Place 1 spray into both nostrils 2 (two) times daily. [provider] Taking Active Child, Pharmacy Records, Multiple Informants  fluticasone furoate-vilanterol (BREO ELLIPTA) 100-25 MCG/ACT AEPB [id number] No  Inhale 1 puff into the lungs daily. [provider] Taking Active Child, Pharmacy Records, Multiple Informants  furosemide (LASIX) 40 MG tablet [id number] No Take 1 tablet (40 mg total) by mouth daily. [name], NP 06/22/2023 Active Child, Pharmacy Records, Multiple Informants  gabapentin (NEURONTIN) 100 MG capsule [id number] No Take 2 capsules (200 mg total) by mouth at bedtime. [name], NP 06/22/2023 Bedtime Active Child, Pharmacy Records, Multiple Informants  glimepiride (AMARYL) 2 MG tablet [id number] No TAKE 1 TABLET BY MOUTH DAILY FOR DIABETES [name], NP 06/22/2023 Active Child, Pharmacy Records, Multiple Informants  ipratropium-albuterol (DUONEB) 0.5-2.5 (3) MG/3ML SOLN [id number] No INHALE 1 VIAL USING NEBULIZER EVERY SIX HOURS AS NEEDED FOR SHORTNESS OF Marlinda Mike, NP 06/22/2023 Active Child, Pharmacy Records, Multiple Informants  isosorbide mononitrate (IMDUR) 30 MG 24 hr tablet [id number] No Take 30 mg by mouth daily.  Patient not taking: Reported on 06/23/2023   [provider] Not Taking Active Child, Pharmacy Records, Multiple Informants  Lacosamide (VIMPAT) 150 MG TABS [id number] No Take 150 mg by mouth every 12 (twelve) hours. [provider] Taking Active Child, Pharmacy Records, Multiple Informants  lacosamide (VIMPAT) 50 MG TABS tablet [id number]  Take twice a day along with the 150 mg tabs for 5 days Wouk, Wilfred Curtis, MD  Active   losartan (COZAAR) 100 MG tablet [id number] No TAKE 1 TABLET BY MOUTH DAILY AS DIRECTED [name], NP 06/22/2023 Active Child, Pharmacy Records, Multiple Informants  metoprolol succinate (TOPROL-XL) 25 MG 24 hr tablet [id number] No TAKE 1 TABLET BY MOUTH DAILY [name], NP 06/22/2023 Active Child, Pharmacy Records, Multiple Informants  montelukast (SINGULAIR) 10 MG tablet [id number] No TAKE 1 TABLET BY MOUTH AT BEDTIME [name], NP 06/22/2023 Bedtime Active Child, Pharmacy Records, Multiple Informants   pantoprazole (PROTONIX) 40  MG tablet [id number] No TAKE 1 TABLET BY MOUTH DAILY FOR REFLUX/HEARTBURN [name], NP 06/22/2023 Active Child, Pharmacy Records, Multiple Informants  potassium chloride (KLOR-CON) 10 MEQ tablet [id number] No TAKE 2 TABLETS BY MOUTH DAILY [name], NP 06/22/2023 Active Child, Pharmacy Records, Multiple Informants  rOPINIRole (REQUIP) 0.5 MG tablet [id number] No Take 1 tablet (0.5 mg total) by mouth at bedtime.  Patient not taking: Reported on 06/23/2023   Enedina Finner, MD Not Taking Active Child, Pharmacy Records, Multiple Informants  rosuvastatin (CRESTOR) 10 MG tablet [id number] No TAKE 1 TABLET BY MOUTH AT BEDTIME [name], NP 06/22/2023 Active Child, Pharmacy Records, Multiple Informants  spironolactone (ALDACTONE) 25 MG tablet [id number] No TAKE 1 TABLET BY MOUTH EVERY MORNING [name], NP 06/22/2023 Active Child, Pharmacy Records, Multiple Informants  traMADol (ULTRAM) 50 MG tablet [id number] No Take 1 tablet (50 mg total) by mouth every 12 (twelve) hours as needed for moderate pain or severe pain.  Patient not taking: Reported on 06/23/2023   Enedina Finner, MD Not Taking Active Child, Pharmacy Records, Multiple Informants            Home Care and Equipment/Supplies: Were Home Health Services Ordered?: No Any new equipment or medical supplies ordered?: No  Functional Questionnaire: Do you need assistance with bathing/showering or dressing?: No Do you need assistance with meal preparation?: No Do you need assistance with eating?: No Do you have difficulty maintaining continence: No Do you need assistance with getting out of bed/getting out of a chair/moving?: No Do you have difficulty managing or taking your medications?: No  Follow up appointments reviewed: PCP Follow-up appointment confirmed?: No MD Provider Line [phone] Given: No Specialist Hospital Follow-up appointment confirmed?: No Reason Specialist Follow-Up Not Confirmed:  Patient has Specialist Provider Number and will Call for Appointment Do you need transportation to your follow-up appointment?: No Do you understand care options if your condition(s) worsen?: Yes-patient verbalized understanding  SDOH Interventions Today    Flowsheet Row Most Recent Value  SDOH Interventions   Food Insecurity Interventions Intervention Not Indicated  Housing Interventions Intervention Not Indicated  Transportation Interventions Intervention Not Indicated  Utilities Interventions Intervention Not Indicated      Interventions Today    Flowsheet Row Most Recent Value  Chronic Disease   Chronic disease during today's visit Chronic Obstructive Pulmonary Disease (COPD)  General Interventions   General Interventions Discussed/Reviewed General Interventions Discussed  Pharmacy Interventions   Pharmacy Dicussed/Reviewed Pharmacy Topics Discussed, Medications and their functions      The patient has been provided with contact information for the care management team and has been advised to call with any health-related questions or concerns. The patient verbalized understanding with current POC. The patient is directed to their insurance card regarding availability of benefits coverage.   Deidre Ala, RN Medical illustrator [facility] [phone]

## 2023-06-29 NOTE — Consult Note (Signed)
[facility] Liaison Note  06/29/2023  [name] 20-May-1951 [id number]  Location: RN Hospital Liaison screened the patient remotely at [facility].  Insurance: [employer]   [name] is a 72 y.o. female who is a Primary Care Patient of [name], NP The patient was screened for  readmission hospitalization with noted high risk score for unplanned readmission risk with 2 IP/2 ED in 6 months.  The patient was assessed for potential Care Management service needs for post hospital transition for care coordination. Review of patient's electronic medical record reveals patient Respiratory Distress. Pt discharged home with self care. No anticipated needs for VBCI at this time. PCP will noted for transition of care follow up.   VBCI Care Management/Population Health does not replace or interfere with any arrangements made by the Inpatient Transition of Care team.   For questions contact:   Elliot Cousin, RN, [facility] Liaison [place]   [facility], Population Health Office Hours MTWF  8:00 am-6:00 pm Direct Dial: [phone] mobile [phone] [Office toll free line] Office Hours are M-F 8:30 - 5 pm [email or web address]

## 2023-06-30 LAB — CULTURE, BLOOD (ROUTINE X 2)
Culture: NO GROWTH
Culture: NO GROWTH
Special Requests: ADEQUATE
Special Requests: ADEQUATE

## 2023-07-05 ENCOUNTER — Other Ambulatory Visit: Payer: Self-pay

## 2023-07-05 ENCOUNTER — Emergency Department: Payer: 59

## 2023-07-05 DIAGNOSIS — Z20822 Contact with and (suspected) exposure to covid-19: Secondary | ICD-10-CM | POA: Insufficient documentation

## 2023-07-05 DIAGNOSIS — E119 Type 2 diabetes mellitus without complications: Secondary | ICD-10-CM | POA: Diagnosis not present

## 2023-07-05 DIAGNOSIS — J441 Chronic obstructive pulmonary disease with (acute) exacerbation: Secondary | ICD-10-CM | POA: Diagnosis not present

## 2023-07-05 DIAGNOSIS — Z96611 Presence of right artificial shoulder joint: Secondary | ICD-10-CM | POA: Diagnosis not present

## 2023-07-05 DIAGNOSIS — I509 Heart failure, unspecified: Secondary | ICD-10-CM | POA: Insufficient documentation

## 2023-07-05 DIAGNOSIS — R058 Other specified cough: Secondary | ICD-10-CM | POA: Diagnosis not present

## 2023-07-05 DIAGNOSIS — J449 Chronic obstructive pulmonary disease, unspecified: Secondary | ICD-10-CM | POA: Diagnosis not present

## 2023-07-05 DIAGNOSIS — R0602 Shortness of breath: Secondary | ICD-10-CM | POA: Diagnosis not present

## 2023-07-05 LAB — TROPONIN I (HIGH SENSITIVITY): Troponin I (High Sensitivity): 10 ng/L (ref <18)

## 2023-07-05 LAB — COMPREHENSIVE METABOLIC PANEL
ALT: 15 U/L (ref 0–44)
AST: 19 U/L (ref 15–41)
Albumin: 3.5 g/dL (ref 3.5–5.0)
Alkaline Phosphatase: 86 U/L (ref 38–126)
Anion gap: 14 (ref 5–15)
BUN: 14 mg/dL (ref 8–23)
CO2: 25 mmol/L (ref 22–32)
Calcium: 8.8 mg/dL — ABNORMAL LOW (ref 8.9–10.3)
Chloride: 98 mmol/L (ref 98–111)
Creatinine, Ser: 0.96 mg/dL (ref 0.44–1.00)
GFR, Estimated: >60 mL/min (ref >60)
Glucose, Bld: 256 mg/dL — ABNORMAL HIGH (ref 70–99)
Potassium: 4.1 mmol/L (ref 3.5–5.1)
Sodium: 137 mmol/L (ref 135–145)
Total Bilirubin: 0.6 mg/dL (ref <1.2)
Total Protein: 6.6 g/dL (ref 6.5–8.1)

## 2023-07-05 LAB — RESP PANEL BY RT-PCR (RSV, FLU A&B, COVID)  RVPGX2
Influenza A by PCR: NEGATIVE
Influenza B by PCR: NEGATIVE
Resp Syncytial Virus by PCR: NEGATIVE
SARS Coronavirus 2 by RT PCR: NEGATIVE

## 2023-07-05 LAB — CBC WITH DIFFERENTIAL/PLATELET
Abs Immature Granulocytes: 0.03 10*3/uL (ref 0.00–0.07)
Basophils Absolute: 0 10*3/uL (ref 0.0–0.1)
Basophils Relative: 0 %
Eosinophils Absolute: 0.1 10*3/uL (ref 0.0–0.5)
Eosinophils Relative: 2 %
HCT: 40.4 % (ref 36.0–46.0)
Hemoglobin: 12.8 g/dL (ref 12.0–15.0)
Immature Granulocytes: 0 %
Lymphocytes Relative: 34 %
Lymphs Abs: 2.9 10*3/uL (ref 0.7–4.0)
MCH: 27.2 pg (ref 26.0–34.0)
MCHC: 31.7 g/dL (ref 30.0–36.0)
MCV: 86 fL (ref 80.0–100.0)
Monocytes Absolute: 1.1 10*3/uL — ABNORMAL HIGH (ref 0.1–1.0)
Monocytes Relative: 12 %
Neutro Abs: 4.4 10*3/uL (ref 1.7–7.7)
Neutrophils Relative %: 52 %
Platelets: 216 10*3/uL (ref 150–400)
RBC: 4.7 MIL/uL (ref 3.87–5.11)
RDW: 13.2 % (ref 11.5–15.5)
WBC: 8.5 10*3/uL (ref 4.0–10.5)
nRBC: 0 % (ref 0.0–0.2)

## 2023-07-05 LAB — BRAIN NATRIURETIC PEPTIDE: B Natriuretic Peptide: 60 pg/mL (ref 0.0–100.0)

## 2023-07-05 NOTE — ED Provider Triage Note (Signed)
Emergency Medicine Provider Triage Evaluation Note  [name] , a 72 y.o. female  was evaluated in triage.  Pt complains of SOB since Friday.  Review of Systems  Positive: SOB Negative: Fever, leg swelling  Physical Exam  BP 107/62   Pulse (!) 57   Temp 97.7 F (36.5 C) (Oral)   Resp (!) 24   SpO2 97%  Gen:   Awake, no distress   Resp:  Normal effort  MSK:   Moves extremities without difficulty  Other:    Medical Decision Making  Medically screening exam initiated at 6:02 PM.  Appropriate orders placed.  [name] was informed that the remainder of the evaluation will be completed by another provider, this initial triage assessment does not replace that evaluation, and the importance of remaining in the ED until their evaluation is complete.     Cameron Ali, PA-C 07/05/23 1803

## 2023-07-05 NOTE — ED Triage Notes (Signed)
Pt c/o productive cough and struggling with catching her breath since Friday. Pt unsure if she has had a fever. Pt is normally on 3lpm oxygen but has increased it intermittently at home. Pt in triage with pursed lip breathing and audible rhonchi. Pt denies extremity swelling. Pt reports a family member has been sick. Pt concerned for pneumonia

## 2023-07-05 NOTE — ED Notes (Signed)
Lab called to draw repeat trop at this time.

## 2023-07-06 ENCOUNTER — Emergency Department
Admission: EM | Admit: 2023-07-06 | Discharge: 2023-07-06 | Disposition: A | Discharge status: Home / Self Care | Payer: 59 | Attending: Emergency Medicine | Admitting: Emergency Medicine

## 2023-07-06 DIAGNOSIS — J441 Chronic obstructive pulmonary disease with (acute) exacerbation: Secondary | ICD-10-CM

## 2023-07-06 LAB — TROPONIN I (HIGH SENSITIVITY): Troponin I (High Sensitivity): 11 ng/L (ref <18)

## 2023-07-06 MED ORDER — PREDNISONE 20 MG PO TABS
40.0000 mg | ORAL_TABLET | Freq: Once | ORAL | Status: AC
  Administered 2023-07-06: 40 mg via ORAL
  Filled 2023-07-06: qty 2

## 2023-07-06 MED ORDER — IPRATROPIUM-ALBUTEROL 0.5-2.5 (3) MG/3ML IN SOLN
3.0000 mL | Freq: Once | RESPIRATORY_TRACT | Status: AC
  Administered 2023-07-06: 3 mL via RESPIRATORY_TRACT
  Filled 2023-07-06: qty 3

## 2023-07-06 MED ORDER — IPRATROPIUM BROMIDE 0.02 % IN SOLN: 0.5000 mg | Freq: Four times a day (QID) | RESPIRATORY_TRACT | 12 refills | Status: AC

## 2023-07-06 MED ORDER — DOXYCYCLINE HYCLATE 100 MG PO TABS
100.0000 mg | ORAL_TABLET | Freq: Two times a day (BID) | ORAL | 0 refills | Status: DC
Start: 2023-07-06 — End: 2023-09-14

## 2023-07-06 MED ORDER — PREDNISONE 10 MG PO TABS
ORAL_TABLET | ORAL | 0 refills | Status: AC
Start: 2023-07-06 — End: 2023-07-11

## 2023-07-06 NOTE — ED Provider Notes (Signed)
[name], Karen is translating for her HPI  [name] is a 72 y.o. female with history of Sherman, [name], [name], [name] L nasal cannula as needed, she reports she has been having to use it more frequently especially with ambulating or exerting herself.  No chest pain.  No fevers reported.  No sick contacts noted     Physical Exam   Triage Vital Signs: ED Triage Vitals [07/05/23 1758]  Encounter Vitals Group     BP 107/62     Systolic BP Percentile      Diastolic BP Percentile      Pulse Rate (!) 57     Resp (!) 24     Temp 97.7 F (36.5 C)     Temp Source Oral     SpO2 97 %     Weight      Height      Head Circumference      Peak Flow      Pain Score 8     Pain Loc      Pain Education      Exclude from Growth Chart     Most recent vital signs: Vitals:   07/06/23 0552 07/06/23 0850  BP: (!) 121/54 124/76  Pulse: 60 64  Resp: 20 18  Temp: 97.9 F (36.6 C) 98.Sherman F (36.8 C)  SpO2: 90% 94%     General: Awake, no distress.  CV:  Good peripheral perfusion.  Resp:  Normal effort.  Scattered wheezing, bibasilar Rales Abd:  No distention.  Other:  No lower extremity edema   ED Results / Procedures / Treatments   Labs (all labs ordered are listed, but only abnormal results are displayed) Labs Reviewed  CBC WITH DIFFERENTIAL/PLATELET - Abnormal; Notable for the following components:      Result Value   Monocytes Absolute 1.1 (*)    All other components within normal limits  COMPREHENSIVE METABOLIC PANEL - Abnormal; Notable for the following components:   Glucose, Bld 256 (*)    Calcium 8.8 (*)    All other components within normal limits  RESP PANEL BY RT-PCR (RSV, FLU A&B, COVID)   RVPGX2  BRAIN NATRIURETIC PEPTIDE  TROPONIN I (HIGH SENSITIVITY)  TROPONIN I (HIGH SENSITIVITY)     EKG  ED ECG REPORT I, Jene Every, the attending physician, personally viewed and interpreted this ECG.  Date: 07/06/2023  Rhythm: normal sinus rhythm QRS Axis: normal Intervals: normal ST/T Wave abnormalities: normal Narrative Interpretation: no evidence of acute ischemia    RADIOLOGY Chest x-ray viewed interpret by me, no evidence of pneumonia    PROCEDURES:  Critical Care performed:   Procedures   MEDICATIONS ORDERED IN ED: Medications  ipratropium-albuterol (DUONEB) 0.5-Sherman.5 (3) MG/3ML nebulizer solution 3 mL (3 mLs Nebulization Given 07/06/23 0742)  ipratropium-albuterol (DUONEB) 0.5-Sherman.5 (3) MG/3ML nebulizer solution 3 mL (3 mLs Nebulization Given 07/06/23 0742)  predniSONE (DELTASONE) tablet 40 mg (40 mg Oral Given 07/06/23 0741)  ipratropium-albuterol (DUONEB) 0.5-Sherman.5 (3) MG/3ML nebulizer solution 3 mL (3 mLs Nebulization Given 07/06/23 0824)     IMPRESSION / MDM / ASSESSMENT AND PLAN / ED COURSE  I reviewed the triage vital signs and the nursing notes. Patient's  presentation is most consistent with severe exacerbation of chronic illness.  Patient presents with Karen of breath, Sherman as detailed above.  Oxygen saturations around 90% on room air.  Scattered wheezing on exam, mild Sherman noted.  Suspect upper respiratory infection exacerbating [name].  Chest x-ray without evidence of pneumonia, no evidence of pulmonary edema  Treated with DuoNeb x Sherman, low-dose p.o. prednisone after discussing with patient given the risk of elevating glucose with her Karen, she reports she has tolerated it before  Patient feeling much better after additional DuoNeb, oxygen saturations are above 95%.  Appropriate for discharge at this time, patient and Karen agree with this plan, she has oxygen, nebulizer at home, will prescribe DuoNebs, prednisone, antibiotics, strict  return precautions      FINAL CLINICAL IMPRESSION(S) / ED DIAGNOSES   Final diagnoses:  [name] exacerbation (HCC)     Rx / DC Orders   ED Discharge Orders          Ordered    ipratropium (ATROVENT) 0.02 % nebulizer solution  4 times daily        07/06/23 0846    predniSONE (DELTASONE) 10 MG tablet  Daily        07/06/23 0846    doxycycline (VIBRA-TABS) 100 MG tablet  Sherman times daily        07/06/23 0846             Note:  This document was prepared using Dragon voice recognition software and may include unintentional dictation errors.   Jene Every, MD 07/06/23 1017

## 2023-07-06 NOTE — ED Notes (Signed)
Brought to triage ext.  Says normally on oxygen at 2, but is on 3 liters currently.  She has a loose cough right now.

## 2023-07-06 NOTE — ED Notes (Signed)
Says feels better.  She did cough up a bunch during the treatment.99% on 2 liters.

## 2023-07-10 ENCOUNTER — Encounter: Payer: Self-pay | Admitting: Nurse Practitioner

## 2023-07-10 ENCOUNTER — Ambulatory Visit (INDEPENDENT_AMBULATORY_CARE_PROVIDER_SITE_OTHER): Payer: 59 | Admitting: Nurse Practitioner

## 2023-07-10 VITALS — BP 168/80 | HR 59 | Temp 98.0°F | Ht 63.0 in | Wt 151.2 lb

## 2023-07-10 DIAGNOSIS — I10 Essential (primary) hypertension: Secondary | ICD-10-CM

## 2023-07-10 DIAGNOSIS — G40909 Epilepsy, unspecified, not intractable, without status epilepticus: Secondary | ICD-10-CM | POA: Diagnosis not present

## 2023-07-10 DIAGNOSIS — J441 Chronic obstructive pulmonary disease with (acute) exacerbation: Secondary | ICD-10-CM

## 2023-07-10 DIAGNOSIS — J9621 Acute and chronic respiratory failure with hypoxia: Secondary | ICD-10-CM

## 2023-07-10 DIAGNOSIS — R7881 Bacteremia: Secondary | ICD-10-CM | POA: Diagnosis not present

## 2023-07-10 DIAGNOSIS — Z720 Tobacco use: Secondary | ICD-10-CM

## 2023-07-10 DIAGNOSIS — J9622 Acute and chronic respiratory failure with hypercapnia: Secondary | ICD-10-CM

## 2023-07-10 DIAGNOSIS — B001 Herpesviral vesicular dermatitis: Secondary | ICD-10-CM

## 2023-07-10 DIAGNOSIS — B953 Streptococcus pneumoniae as the cause of diseases classified elsewhere: Secondary | ICD-10-CM

## 2023-07-10 MED ORDER — ACYCLOVIR 5 % EX CREA: 1.0000 | TOPICAL_CREAM | Freq: Every day | CUTANEOUS | 5 refills | Status: DC

## 2023-07-10 NOTE — Assessment & Plan Note (Signed)
 She reports recurrent cold sores without prodromal symptoms. We will prescribe a topical antiviral ointment for use at the onset of a cold sore.

## 2023-07-10 NOTE — Assessment & Plan Note (Signed)
 Despite continued smoking, albeit not full cigarettes and not near oxygen, we encourage smoking cessation for overall health improvement.

## 2023-07-10 NOTE — Assessment & Plan Note (Signed)
 Elevated blood pressure was noted during the visit, attributed to not taking medication prior to the appointment. She is advised to take blood pressure medication as prescribed and recheck blood pressure before leaving the office. She will continue her current medication regimen.

## 2023-07-10 NOTE — Progress Notes (Signed)
 Leron Glance, NP-C Phone: [phone]  [name] is a 73 y.o. female who presents today for hospital follow up.   Discussed the use of AI scribe software for clinical note transcription with the patient, who gave verbal consent to proceed.  History of Present Illness   The patient, with a history of COPD, was recently hospitalized twice in December due to respiratory distress. The first admission was from December 17th to 20th, and the second admission was on December 30th. The patient reported feeling fine since the last hospitalization. She is currently on prednisone  and doxycycline . The most recent hospitalization was due to cough and shortness of breath, but a chest x-ray was negative for pneumonia.  The patient has been using a DuoNeb nebulizer three times a day, which is less than the prescribed four times a day. However, she reported that her breathing has improved since adding the DuoNeb to her regimen. She occasionally experiences cough and shortness of breath, but these symptoms have improved since the last hospitalization.  During the first hospitalization, the patient was found to have a bacterial infection in her blood, which was treated with antibiotics. However, there was some confusion about the duration of the antibiotic course and whether prednisone  was also needed. The patient completed the first course of antibiotics as directed.  The patient is also on seizure medication (Vimpat ), which was increased during the hospital stay due to several seizures. The patient has since returned to her normal dose.  The patient uses oxygen  therapy, wearing it at night and when walking or going out, but not always during the day when at home. She has a pulse oximeter at home to check her oxygen  levels.  The patient is a smoker, but does not smoke whole cigarettes. She is aware of the risks of smoking near oxygen .  The patient also experiences recurrent cold sores, for which she  requested an ointment. She does not wish to take daily preventative medication for this issue.  The patient is scheduled to follow up with pulmonology and infectious disease specialists.      Social History   Tobacco Use  Smoking Status Every Day   Current packs/day: 0.50   Average packs/day: 0.5 packs/day for 55.4 years (27.7 ttl pk-yrs)   Types: Cigarettes   Start date: 03/01/1968  Smokeless Tobacco Never    Current Outpatient Medications on File Prior to Visit  Medication Sig Dispense Refill   citalopram  (CELEXA ) 10 MG tablet TAKE 1 TABLET BY MOUTH DAILY 90 tablet 3   clopidogrel  (PLAVIX ) 75 MG tablet TAKE 1 TABLET BY MOUTH DAILY 90 tablet 1   doxycycline  (VIBRA -TABS) 100 MG tablet Take 1 tablet (100 mg total) by mouth 2 (two) times daily. 14 tablet 0   fluticasone  (FLONASE ) 50 MCG/ACT nasal spray Place 1 spray into both nostrils 2 (two) times daily.     fluticasone  furoate-vilanterol (BREO ELLIPTA ) 100-25 MCG/ACT AEPB Inhale 1 puff into the lungs daily.     furosemide  (LASIX ) 40 MG tablet Take 1 tablet (40 mg total) by mouth daily. 90 tablet 3   gabapentin  (NEURONTIN ) 100 MG capsule Take 2 capsules (200 mg total) by mouth at bedtime. 180 capsule 3   glimepiride  (AMARYL ) 2 MG tablet TAKE 1 TABLET BY MOUTH DAILY FOR DIABETES 90 tablet 3   ipratropium (ATROVENT ) 0.02 % nebulizer solution Take 2.5 mLs (0.5 mg total) by nebulization 4 (four) times daily. 75 mL 12   ipratropium-albuterol  (DUONEB) 0.5-2.5 (3) MG/3ML SOLN INHALE 1 VIAL USING  NEBULIZER EVERY SIX HOURS AS NEEDED FOR SHORTNESS OF BREATH 90 mL 1   isosorbide  mononitrate (IMDUR ) 30 MG 24 hr tablet Take 30 mg by mouth daily. (Patient not taking: Reported on 06/23/2023)     Lacosamide  (VIMPAT ) 150 MG TABS Take 150 mg by mouth every 12 (twelve) hours.     lacosamide  (VIMPAT ) 50 MG TABS tablet Take twice a day along with the 150 mg tabs for 5 days 10 tablet 1   losartan  (COZAAR ) 100 MG tablet TAKE 1 TABLET BY MOUTH DAILY AS DIRECTED  90 tablet 3   metoprolol  succinate (TOPROL -XL) 25 MG 24 hr tablet TAKE 1 TABLET BY MOUTH DAILY 90 tablet 3   montelukast  (SINGULAIR ) 10 MG tablet TAKE 1 TABLET BY MOUTH AT BEDTIME 90 tablet 3   pantoprazole  (PROTONIX ) 40 MG tablet TAKE 1 TABLET BY MOUTH DAILY FOR REFLUX/HEARTBURN 90 tablet 3   potassium chloride  (KLOR-CON ) 10 MEQ tablet TAKE 2 TABLETS BY MOUTH DAILY 180 tablet 3   predniSONE  (DELTASONE ) 10 MG tablet Take 2 tablets (20 mg total) by mouth daily for 2 days, THEN 1 tablet (10 mg total) daily for 3 days. 7 tablet 0   rOPINIRole  (REQUIP ) 0.5 MG tablet Take 1 tablet (0.5 mg total) by mouth at bedtime. (Patient not taking: Reported on 06/23/2023) 30 tablet 0   rosuvastatin  (CRESTOR ) 10 MG tablet TAKE 1 TABLET BY MOUTH AT BEDTIME 90 tablet 3   spironolactone  (ALDACTONE ) 25 MG tablet TAKE 1 TABLET BY MOUTH EVERY MORNING 90 tablet 0   traMADol  (ULTRAM ) 50 MG tablet Take 1 tablet (50 mg total) by mouth every 12 (twelve) hours as needed for moderate pain or severe pain. (Patient not taking: Reported on 06/23/2023) 15 tablet 0   No current facility-administered medications on file prior to visit.    ROS see history of present illness  Objective  Physical Exam Vitals:   07/10/23 0841 07/10/23 0905  BP: (!) 180/78 (!) 168/80  Pulse: (!) 59   Temp: 98 F (36.7 C)   SpO2: 92%     BP Readings from Last 3 Encounters:  07/10/23 (!) 168/80  07/06/23 124/76  06/26/23 (!) 167/51   Wt Readings from Last 3 Encounters:  07/10/23 151 lb 3.2 oz (68.6 kg)  06/23/23 165 lb (74.8 kg)  04/14/23 150 lb (68 kg)    Physical Exam Vitals reviewed.  Constitutional:      General: She is not in acute distress.    Appearance: Normal appearance.  HENT:     Head: Normocephalic.  Cardiovascular:     Rate and Rhythm: Normal rate and regular rhythm.     Heart sounds: Normal heart sounds.  Pulmonary:     Effort: Pulmonary effort is normal.     Breath sounds: Wheezing (global) and rhonchi (global)  present.  Skin:    General: Skin is warm and dry.  Neurological:     General: No focal deficit present.     Mental Status: She is alert.  Psychiatric:        Mood and Affect: Mood normal.        Behavior: Behavior normal.     Assessment/Plan: Please see individual problem list.  COPD with acute exacerbation (HCC) Assessment & Plan: Following recent hospitalizations in December for cough and shortness of breath, she showed improvement with DuoNeb nebulizer treatments, now used three times daily, less than the prescribed four times. Chest X-ray was negative for pneumonia, and no current shortness of breath is reported. We will  continue DuoNeb nebulizer treatments, encouraging use four times daily as prescribed, alongside continuing Prednisone  and Doxycycline  as prescribed. A follow-up with pulmonology is scheduled for January 13th.   Acute on chronic respiratory failure with hypoxia and hypercapnia (HCC) Assessment & Plan: She is using oxygen  more frequently, including at night and when active, sometimes during the day. We encourage consistent use of oxygen  as prescribed, especially during activity and at night. O2- 92% on 2L today in office. She is able to monitor her oxygen  levels at home. Strict precautions given to patient.    Seizure disorder [facility]) Assessment & Plan: She was recently hospitalized and experienced an increase in seizures, during which medication was increased, now returned to the regular dose. She will continue Vimpat  as prescribed and ensure regular follow-up with neurology.   Bacteremia due to Streptococcus pneumoniae Assessment & Plan: After a recent hospitalization for Streptococcus pneumoniae bacteremia and completing the initial antibiotic course, concerns arise about the potential impact of current antibiotic use on upcoming blood cultures. She will complete the current antibiotic course as prescribed and follow up with Infectious Disease on January  7th.   Essential hypertension Assessment & Plan: Elevated blood pressure was noted during the visit, attributed to not taking medication prior to the appointment. She is advised to take blood pressure medication as prescribed and recheck blood pressure before leaving the office. She will continue her current medication regimen.    Cold sore Assessment & Plan: She reports recurrent cold sores without prodromal symptoms. We will prescribe a topical antiviral ointment for use at the onset of a cold sore.  Orders: -     Acyclovir ; Apply 1 Application topically 5 (five) times daily. X 4 days. Start at onset of symptoms.  Dispense: 15 g; Refill: 5  Tobacco use Assessment & Plan: Despite continued smoking, albeit not full cigarettes and not near oxygen , we encourage smoking cessation for overall health improvement.    Return in about 3 months (around 10/08/2023) for Follow up.   Leron Glance, NP-C [facility]

## 2023-07-10 NOTE — Assessment & Plan Note (Signed)
 She was recently hospitalized and experienced an increase in seizures, during which medication was increased, now returned to the regular dose. She will continue Vimpat as prescribed and ensure regular follow-up with neurology.

## 2023-07-10 NOTE — Assessment & Plan Note (Addendum)
 She is using oxygen  more frequently, including at night and when active, sometimes during the day. We encourage consistent use of oxygen  as prescribed, especially during activity and at night. O2- 92% on 2L today in office. She is able to monitor her oxygen  levels at home. Strict precautions given to patient.

## 2023-07-10 NOTE — Assessment & Plan Note (Signed)
 Following recent hospitalizations in December for cough and shortness of breath, she showed improvement with DuoNeb nebulizer treatments, now used three times daily, less than the prescribed four times. Chest X-ray was negative for pneumonia, and no current shortness of breath is reported. We will continue DuoNeb nebulizer treatments, encouraging use four times daily as prescribed, alongside continuing Prednisone  and Doxycycline  as prescribed. A follow-up with pulmonology is scheduled for January 13th.

## 2023-07-10 NOTE — Assessment & Plan Note (Signed)
 After a recent hospitalization for Streptococcus pneumoniae bacteremia and completing the initial antibiotic course, concerns arise about the potential impact of current antibiotic use on upcoming blood cultures. She will complete the current antibiotic course as prescribed and follow up with Infectious Disease on January 7th.

## 2023-07-14 ENCOUNTER — Encounter: Payer: Self-pay | Admitting: Infectious Diseases

## 2023-07-14 ENCOUNTER — Ambulatory Visit: Payer: 59 | Attending: Infectious Diseases | Admitting: Infectious Diseases

## 2023-07-14 VITALS — BP 145/83 | HR 74 | Temp 96.4°F | Ht 63.0 in | Wt 146.0 lb

## 2023-07-14 DIAGNOSIS — B953 Streptococcus pneumoniae as the cause of diseases classified elsewhere: Secondary | ICD-10-CM | POA: Diagnosis not present

## 2023-07-14 DIAGNOSIS — I110 Hypertensive heart disease with heart failure: Secondary | ICD-10-CM | POA: Insufficient documentation

## 2023-07-14 DIAGNOSIS — R7881 Bacteremia: Secondary | ICD-10-CM

## 2023-07-14 DIAGNOSIS — J4489 Other specified chronic obstructive pulmonary disease: Secondary | ICD-10-CM | POA: Diagnosis not present

## 2023-07-14 NOTE — Patient Instructions (Addendum)
 You are ehre for follow up of recent pneumococcus bacteremia and infection- you are finishing doxy today- will repeat Blood work 2 weeks later- 07/29/23

## 2023-07-14 NOTE — Progress Notes (Signed)
 NAME: [name]  DOB: 07-22-1950  MRN: [id number]  Date/Time: 07/14/2023 8:55 AM  Rock CHRISTELLA Rattler is a 73 y.o. with a history of CHF, COPD, DM, NASH,RA, Sz, HTN   Pt here with her son, follow up visit after recent hospitalization for Strep pneumonia bacteremia and resp infection After being on  IV antibiotic in the hospital she went home on 06/26/23 Amoxicillin  for 7 days ( she was given 3 days and we gave her 4 more days) She then went to [place]  to se eher grandosn by car and was SOB in the hotel room. She had oxygen  with her but son says they had to return early. They came to the ED on 12/29 and CXR cardiomegaly, CBC N, cr N  she was prescribed doxy for 7 days  ? Se is doing better Says she is coughing less , unable to get sputum out as she would like to. Has follow up appt with pulmonologist soon  Past Medical History:  Diagnosis Date   Asthma    CHF (congestive heart failure) (HCC)    Cirrhosis, non-alcoholic (HCC)    COPD (chronic obstructive pulmonary disease) (HCC)    Deaf    Depression    Diabetes mellitus without complication (HCC)    GERD (gastroesophageal reflux disease)    Heart murmur    Hepatitis    History of rheumatic fever    History of scarlet fever    Hypertension    IBS (irritable bowel syndrome)    Lymph node disorder    arm   Neuropathy    On home oxygen  therapy    hs   Orthopnea    Osteoarthritis    RA (rheumatoid arthritis) (HCC)    RLS (restless legs syndrome)    Seizures (HCC)    Shortness of breath dyspnea    Sleep apnea    Stroke (HCC)    tia    Past Surgical History:  Procedure Laterality Date   ABDOMINAL HYSTERECTOMY     BREAST BIOPSY Right 10/30/2020   Stereo Bx, X-clip,  BENIGN BREAST TISSUE WITH FIBROADENOMATOUS   CATARACT EXTRACTION W/PHACO Right 11/23/2014   Procedure: CATARACT EXTRACTION PHACO AND INTRAOCULAR LENS PLACEMENT (IOC);  Surgeon: Newell Ovens, MD;  Location: [facility];  Service: Ophthalmology;  Laterality: Right;    CATARACT EXTRACTION W/PHACO Left 12/14/2014   Procedure: CATARACT EXTRACTION PHACO AND INTRAOCULAR LENS PLACEMENT (IOC);  Surgeon: Newell Ovens, MD;  Location: [facility];  Service: Ophthalmology;  Laterality: Left;  US :01:16.6 AP:15.8 CDE:12.14   CESAREAN SECTION     CHOLECYSTECTOMY     COLONOSCOPY WITH PROPOFOL  N/A 10/08/2020   Procedure: COLONOSCOPY WITH PROPOFOL ;  Surgeon: Toledo, Ladell POUR, MD;  Location: [facility];  Service: Gastroenterology;  Laterality: N/A;   ESOPHAGOGASTRODUODENOSCOPY (EGD) WITH PROPOFOL  N/A 10/08/2020   Procedure: ESOPHAGOGASTRODUODENOSCOPY (EGD) WITH PROPOFOL ;  Surgeon: Toledo, Ladell POUR, MD;  Location: [facility];  Service: Gastroenterology;  Laterality: N/A;  DM DEAF, NEEDS SIGN INTERPRETER PER SON   REVERSE SHOULDER ARTHROPLASTY Right 12/21/2019   Procedure: REVERSE SHOULDER ARTHROPLASTY;  Surgeon: Leora Lynwood SAUNDERS, MD;  Location: [facility];  Service: Orthopedics;  Laterality: Right;   THUMB ARTHROSCOPY     TONSILLECTOMY     TYMPANOPLASTY     muliple    Social History   Socioeconomic History   Marital status: Widowed    Spouse name: Not on file   Number of children: Not on file   Years of education: Not on file   Highest education  level: Not on file  Occupational History   Not on file  Tobacco Use   Smoking status: Every Day    Current packs/day: 0.50    Average packs/day: 0.5 packs/day for 55.4 years (27.7 ttl pk-yrs)    Types: Cigarettes    Start date: 03/01/1968   Smokeless tobacco: Never  Vaping Use   Vaping status: Former  Substance and Sexual Activity   Alcohol  use: No   Drug use: No   Sexual activity: Not Currently  Other Topics Concern   Not on file  Social History Narrative   Pt lives in 1 story home with her son, his girlfriend and a family friend   Has 2 adult children   9th grade education   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    Social Drivers of Corporate Investment Banker Strain:  Low Risk  (03/24/2023)   Received from [facility]   Overall Financial Resource Strain (CARDIA)    Difficulty of Paying Living Expenses: Not hard at all  Food Insecurity: No Food Insecurity (06/29/2023)   Hunger Vital Sign    Worried About Running Out of Food in the Last Year: Never true    Ran Out of Food in the Last Year: Never true  Transportation Needs: No Transportation Needs (06/29/2023)   PRAPARE - Administrator, Civil Service (Medical): No    Lack of Transportation (Non-Medical): No  Physical Activity: Not on file  Stress: Not on file  Social Connections: Not on file  Intimate Partner Violence: Not At Risk (06/29/2023)   Humiliation, Afraid, Rape, and Kick questionnaire    Fear of Current or Ex-Partner: No    Emotionally Abused: No    Physically Abused: No    Sexually Abused: No    Family History  Problem Relation Age of Onset   Lung cancer Mother    CAD Father    Miscarriages / Stillbirths Sister    Hyperlipidemia Sister    Hypertension Sister    Diabetes Sister    COPD Sister    Asthma Sister    Arthritis Sister    Breast cancer Sister    Heart attack Sister    Hyperlipidemia Sister    Heart disease Sister    Heart attack Sister    Diabetes Sister    Cancer Sister    Asthma Sister    Heart disease Brother    Heart attack Brother    Depression Brother    Hypertension Son    Hyperlipidemia Son    Heart disease Son    Depression Son    Hypertension Daughter    Depression Daughter    Asthma Daughter    Birth defects Paternal Uncle    Hearing loss Brother    Allergies  Allergen Reactions   Celebrex [Celecoxib] Itching    itching   Ciprofloxacin Itching   Codeine Itching   Fosphenytoin Itching and Other (See Comments)   Levaquin  [Levofloxacin  In D5w] Itching   Levofloxacin  Itching   Lovastatin Itching   Pravastatin Itching   Sulfa Antibiotics Itching   Aspirin Itching and Rash   Azithromycin  Rash   Penicillins Rash     Documentation indicates severe reaction  Pt tolerated cephalosporin without adverse reaction 09/18    I? Current Outpatient Medications  Medication Sig Dispense Refill   acyclovir  cream (ZOVIRAX ) 5 % Apply 1 Application topically 5 (five) times daily. X 4 days. Start at onset of  symptoms. 15 g 5   citalopram  (CELEXA ) 10 MG tablet TAKE 1 TABLET BY MOUTH DAILY 90 tablet 3   clopidogrel  (PLAVIX ) 75 MG tablet TAKE 1 TABLET BY MOUTH DAILY 90 tablet 1   doxycycline  (VIBRA -TABS) 100 MG tablet Take 1 tablet (100 mg total) by mouth 2 (two) times daily. 14 tablet 0   fluticasone  (FLONASE ) 50 MCG/ACT nasal spray Place 1 spray into both nostrils 2 (two) times daily.     fluticasone  furoate-vilanterol (BREO ELLIPTA ) 100-25 MCG/ACT AEPB Inhale 1 puff into the lungs daily.     furosemide  (LASIX ) 40 MG tablet Take 1 tablet (40 mg total) by mouth daily. 90 tablet 3   gabapentin  (NEURONTIN ) 100 MG capsule Take 2 capsules (200 mg total) by mouth at bedtime. 180 capsule 3   glimepiride  (AMARYL ) 2 MG tablet TAKE 1 TABLET BY MOUTH DAILY FOR DIABETES 90 tablet 3   ipratropium (ATROVENT ) 0.02 % nebulizer solution Take 2.5 mLs (0.5 mg total) by nebulization 4 (four) times daily. 75 mL 12   ipratropium-albuterol  (DUONEB) 0.5-2.5 (3) MG/3ML SOLN INHALE 1 VIAL USING NEBULIZER EVERY SIX HOURS AS NEEDED FOR SHORTNESS OF BREATH 90 mL 1   isosorbide  mononitrate (IMDUR ) 30 MG 24 hr tablet Take 30 mg by mouth daily.     Lacosamide  (VIMPAT ) 150 MG TABS Take 150 mg by mouth every 12 (twelve) hours.     lacosamide  (VIMPAT ) 50 MG TABS tablet Take twice a day along with the 150 mg tabs for 5 days 10 tablet 1   losartan  (COZAAR ) 100 MG tablet TAKE 1 TABLET BY MOUTH DAILY AS DIRECTED 90 tablet 3   metoprolol  succinate (TOPROL -XL) 25 MG 24 hr tablet TAKE 1 TABLET BY MOUTH DAILY 90 tablet 3   montelukast  (SINGULAIR ) 10 MG tablet TAKE 1 TABLET BY MOUTH AT BEDTIME 90 tablet 3   pantoprazole  (PROTONIX ) 40 MG tablet TAKE 1 TABLET BY  MOUTH DAILY FOR REFLUX/HEARTBURN 90 tablet 3   potassium chloride  (KLOR-CON ) 10 MEQ tablet TAKE 2 TABLETS BY MOUTH DAILY 180 tablet 3   rOPINIRole  (REQUIP ) 0.5 MG tablet Take 1 tablet (0.5 mg total) by mouth at bedtime. 30 tablet 0   rosuvastatin  (CRESTOR ) 10 MG tablet TAKE 1 TABLET BY MOUTH AT BEDTIME 90 tablet 3   spironolactone  (ALDACTONE ) 25 MG tablet TAKE 1 TABLET BY MOUTH EVERY MORNING 90 tablet 0   traMADol  (ULTRAM ) 50 MG tablet Take 1 tablet (50 mg total) by mouth every 12 (twelve) hours as needed for moderate pain or severe pain. 15 tablet 0   No current facility-administered medications for this visit.     Abtx:  Anti-infectives (From admission, onward)    None       REVIEW OF SYSTEMS:  Const: negative fever, negative chills, negative weight loss Eyes: negative diplopia or visual changes, negative eye pain ENT: negative coryza, negative sore throat Resp: as above Cards: negative for chest pain, palpitations, lower extremity edema GU: negative for frequency, dysuria and hematuria GI: Negative for abdominal pain, diarrhea, bleeding, constipation Skin: negative for rash and pruritus Heme: negative for easy bruising and gum/nose bleeding MS: negative for myalgias, arthralgias, back pain and muscle weakness Neurolo:negative for headaches, dizziness, vertigo, memory problems  Psych: negative for feelings of anxiety, depression  Endocrine:  diabetes Allergy/Immunology- as above Objective:  VITALS:  BP (!) 145/83   Pulse 74   Temp (!) 96.4 F (35.8 C) (Temporal)   Ht 5' 3 (1.6 m)   Wt 146 lb (66.2 kg)  BMI 25.86 kg/m   PHYSICAL EXAM:  General: Alert, cooperative, no distress, she cannot hear- she lip reads Lungs: Clear to auscultation bilaterally. No Wheezing or Rhonchi. No rales. Heart: Regular rate and rhythm, no murmur, rub or gallop. Abdomen: Soft, non-tender,not distended. Bowel sounds normal. No masses Extremities: atraumatic, no cyanosis. No edema. No  clubbing Skin: No rashes or lesions. Or bruising Lymph: Cervical, supraclavicular normal. Neurologic: Grossly non-focal Pertinent Labs Lab Results CBC    Component Value Date/Time   WBC 8.5 07/05/2023 1839   RBC 4.70 07/05/2023 1839   HGB 12.8 07/05/2023 1839   HGB 15.3 11/05/2013 1138   HCT 40.4 07/05/2023 1839   HCT 44.8 11/05/2013 1138   PLT 216 07/05/2023 1839   PLT 214 11/05/2013 1138   MCV 86.0 07/05/2023 1839   MCV 85 11/05/2013 1138   MCH 27.2 07/05/2023 1839   MCHC 31.7 07/05/2023 1839   RDW 13.2 07/05/2023 1839   RDW 13.3 11/05/2013 1138   LYMPHSABS 2.9 07/05/2023 1839   LYMPHSABS 1.4 04/24/2013 0614   MONOABS 1.1 (H) 07/05/2023 1839   MONOABS 0.2 04/24/2013 0614   EOSABS 0.1 07/05/2023 1839   EOSABS 0.0 04/24/2013 0614   BASOSABS 0.0 07/05/2023 1839   BASOSABS 0.0 04/24/2013 0614       Latest Ref Rng & Units 07/05/2023    6:39 PM 06/26/2023    5:51 AM 06/25/2023    8:37 AM  CMP  Glucose 70 - 99 mg/dL 743  87  768   BUN 8 - 23 mg/dL 14  19  21    Creatinine 0.44 - 1.00 mg/dL 9.03  9.32  9.22   Sodium 135 - 145 mmol/L 137  142  138   Potassium 3.5 - 5.1 mmol/L 4.1  3.6  3.8   Chloride 98 - 111 mmol/L 98  106  102   CO2 22 - 32 mmol/L 25  28  25    Calcium  8.9 - 10.3 mg/dL 8.8  8.5  8.6   Total Protein 6.5 - 8.1 g/dL 6.6     Total Bilirubin <1.2 mg/dL 0.6     Alkaline Phos 38 - 126 U/L 86     AST 15 - 41 U/L 19     ALT 0 - 44 U/L 15         Microbiology: Recent Results (from the past 240 hours)  Resp panel by RT-PCR (RSV, Flu A&B, Covid) Anterior Nasal Swab     Status: None   Collection Time: 07/05/23  5:55 PM   Specimen: Anterior Nasal Swab  Result Value Ref Range Status   SARS Coronavirus 2 by RT PCR NEGATIVE NEGATIVE Final    Comment: (NOTE) SARS-CoV-2 target nucleic acids are NOT DETECTED.  The SARS-CoV-2 RNA is generally detectable in upper respiratory specimens during the acute phase of infection. The lowest concentration of SARS-CoV-2  viral copies this assay can detect is 138 copies/mL. A negative result does not preclude SARS-Cov-2 infection and should not be used as the sole basis for treatment or other patient management decisions. A negative result may occur with  improper specimen collection/handling, submission of specimen other than nasopharyngeal swab, presence of viral mutation(s) within the areas targeted by this assay, and inadequate number of viral copies(<138 copies/mL). A negative result must be combined with clinical observations, patient history, and epidemiological information. The expected result is Negative.  Fact Sheet for Patients:  bloggercourse.com  Fact Sheet for Healthcare Providers:  seriousbroker.it  This test is no t yet  approved or cleared by the [place]  FDA and  has been authorized for detection and/or diagnosis of SARS-CoV-2 by FDA under an Emergency Use Authorization (EUA). This EUA will remain  in effect (meaning this test can be used) for the duration of the COVID-19 declaration under Section 564(b)(1) of the Act, 21 U.S.C.section 360bbb-3(b)(1), unless the authorization is terminated  or revoked sooner.       Influenza A by PCR NEGATIVE NEGATIVE Final   Influenza B by PCR NEGATIVE NEGATIVE Final    Comment: (NOTE) The Xpert Xpress SARS-CoV-2/FLU/RSV plus assay is intended as an aid in the diagnosis of influenza from Nasopharyngeal swab specimens and should not be used as a sole basis for treatment. Nasal washings and aspirates are unacceptable for Xpert Xpress SARS-CoV-2/FLU/RSV testing.  Fact Sheet for Patients: bloggercourse.com  Fact Sheet for Healthcare Providers: seriousbroker.it  This test is not yet approved or cleared by the [place]  FDA and has been authorized for detection and/or diagnosis of SARS-CoV-2 by FDA under an Emergency Use Authorization  (EUA). This EUA will remain in effect (meaning this test can be used) for the duration of the COVID-19 declaration under Section 564(b)(1) of the Act, 21 U.S.C. section 360bbb-3(b)(1), unless the authorization is terminated or revoked.     Resp Syncytial Virus by PCR NEGATIVE NEGATIVE Final    Comment: (NOTE) Fact Sheet for Patients: bloggercourse.com  Fact Sheet for Healthcare Providers: seriousbroker.it  This test is not yet approved or cleared by the [place]  FDA and has been authorized for detection and/or diagnosis of SARS-CoV-2 by FDA under an Emergency Use Authorization (EUA). This EUA will remain in effect (meaning this test can be used) for the duration of the COVID-19 declaration under Section 564(b)(1) of the Act, 21 U.S.C. section 360bbb-3(b)(1), unless the authorization is terminated or revoked.  Performed at [facility], [address] [place], [place] [address]     IMAGING RESULTS:  I have personally reviewed the films ?no infiltrate Impression/Recommendation ? ? Recent strep pneumoniae bacteremia Completed treatment  Respiratory infection/ copd/ bronchits- completing doxy today In 2 weeks  off antibiotics can get a blood culture to make sure pneumococcus resolved Because of her ongoing resp status.    ________________________________________________ Discussed with patient, and her son in detail Note:  This document was prepared using Dragon voice recognition software and may include unintentional dictation errors.

## 2023-07-15 ENCOUNTER — Ambulatory Visit: Payer: 59 | Admitting: Nurse Practitioner

## 2023-07-17 ENCOUNTER — Other Ambulatory Visit: Payer: Self-pay | Admitting: Nurse Practitioner

## 2023-07-17 DIAGNOSIS — J439 Emphysema, unspecified: Secondary | ICD-10-CM

## 2023-07-20 DIAGNOSIS — J449 Chronic obstructive pulmonary disease, unspecified: Secondary | ICD-10-CM | POA: Diagnosis not present

## 2023-07-20 DIAGNOSIS — Z9981 Dependence on supplemental oxygen: Secondary | ICD-10-CM | POA: Diagnosis not present

## 2023-07-20 DIAGNOSIS — R0602 Shortness of breath: Secondary | ICD-10-CM | POA: Diagnosis not present

## 2023-07-20 DIAGNOSIS — F17218 Nicotine dependence, cigarettes, with other nicotine-induced disorders: Secondary | ICD-10-CM | POA: Diagnosis not present

## 2023-07-21 ENCOUNTER — Other Ambulatory Visit: Payer: Self-pay | Admitting: Nurse Practitioner

## 2023-07-21 DIAGNOSIS — B001 Herpesviral vesicular dermatitis: Secondary | ICD-10-CM

## 2023-07-21 MED ORDER — ACYCLOVIR 5 % EX OINT: 1.0000 | TOPICAL_OINTMENT | Freq: Every day | CUTANEOUS | 5 refills | Status: DC

## 2023-08-24 ENCOUNTER — Other Ambulatory Visit: Payer: Self-pay | Admitting: Family

## 2023-08-24 DIAGNOSIS — J439 Emphysema, unspecified: Secondary | ICD-10-CM

## 2023-09-01 ENCOUNTER — Telehealth: Payer: Self-pay | Admitting: Nurse Practitioner

## 2023-09-01 NOTE — Telephone Encounter (Signed)
 Copied from CRM [id number]. Topic: Medicare AWV >> Sep 01, 2023  9:49 AM Payton Doughty wrote: Reason for CRM: Called LVM 09/01/2023 to schedule AWV. Please schedule office or virtual visits.  Verlee Rossetti; Care Guide Ambulatory Clinical Support [place] [facility] Direct Dial: [phone]

## 2023-09-03 DIAGNOSIS — J449 Chronic obstructive pulmonary disease, unspecified: Secondary | ICD-10-CM | POA: Diagnosis not present

## 2023-09-03 DIAGNOSIS — F17218 Nicotine dependence, cigarettes, with other nicotine-induced disorders: Secondary | ICD-10-CM | POA: Diagnosis not present

## 2023-09-03 DIAGNOSIS — Z9981 Dependence on supplemental oxygen: Secondary | ICD-10-CM | POA: Diagnosis not present

## 2023-09-03 DIAGNOSIS — R0609 Other forms of dyspnea: Secondary | ICD-10-CM | POA: Diagnosis not present

## 2023-09-04 DIAGNOSIS — H903 Sensorineural hearing loss, bilateral: Secondary | ICD-10-CM | POA: Diagnosis not present

## 2023-09-04 DIAGNOSIS — H7403 Tympanosclerosis, bilateral: Secondary | ICD-10-CM | POA: Diagnosis not present

## 2023-09-04 DIAGNOSIS — H6121 Impacted cerumen, right ear: Secondary | ICD-10-CM | POA: Diagnosis not present

## 2023-09-04 DIAGNOSIS — H7012 Chronic mastoiditis, left ear: Secondary | ICD-10-CM | POA: Diagnosis not present

## 2023-09-04 DIAGNOSIS — H7201 Central perforation of tympanic membrane, right ear: Secondary | ICD-10-CM | POA: Diagnosis not present

## 2023-09-12 ENCOUNTER — Emergency Department

## 2023-09-12 ENCOUNTER — Other Ambulatory Visit: Payer: Self-pay

## 2023-09-12 ENCOUNTER — Inpatient Hospital Stay
Admission: EM | Admit: 2023-09-12 | Discharge: 2023-09-16 | DRG: 190 | Disposition: A | Discharge status: Home / Self Care | Attending: Internal Medicine | Admitting: Internal Medicine

## 2023-09-12 DIAGNOSIS — I5032 Chronic diastolic (congestive) heart failure: Secondary | ICD-10-CM | POA: Diagnosis present

## 2023-09-12 DIAGNOSIS — Z882 Allergy status to sulfonamides status: Secondary | ICD-10-CM

## 2023-09-12 DIAGNOSIS — F32A Depression, unspecified: Secondary | ICD-10-CM | POA: Diagnosis present

## 2023-09-12 DIAGNOSIS — E119 Type 2 diabetes mellitus without complications: Secondary | ICD-10-CM | POA: Diagnosis present

## 2023-09-12 DIAGNOSIS — R9082 White matter disease, unspecified: Secondary | ICD-10-CM | POA: Diagnosis not present

## 2023-09-12 DIAGNOSIS — G40909 Epilepsy, unspecified, not intractable, without status epilepticus: Secondary | ICD-10-CM | POA: Diagnosis present

## 2023-09-12 DIAGNOSIS — J9811 Atelectasis: Secondary | ICD-10-CM | POA: Diagnosis not present

## 2023-09-12 DIAGNOSIS — J9601 Acute respiratory failure with hypoxia: Secondary | ICD-10-CM

## 2023-09-12 DIAGNOSIS — Z7984 Long term (current) use of oral hypoglycemic drugs: Secondary | ICD-10-CM

## 2023-09-12 DIAGNOSIS — J441 Chronic obstructive pulmonary disease with (acute) exacerbation: Principal | ICD-10-CM | POA: Diagnosis present

## 2023-09-12 DIAGNOSIS — Z83438 Family history of other disorder of lipoprotein metabolism and other lipidemia: Secondary | ICD-10-CM

## 2023-09-12 DIAGNOSIS — J9621 Acute and chronic respiratory failure with hypoxia: Secondary | ICD-10-CM | POA: Diagnosis present

## 2023-09-12 DIAGNOSIS — G4733 Obstructive sleep apnea (adult) (pediatric): Secondary | ICD-10-CM | POA: Diagnosis present

## 2023-09-12 DIAGNOSIS — I4719 Other supraventricular tachycardia: Secondary | ICD-10-CM | POA: Diagnosis not present

## 2023-09-12 DIAGNOSIS — Z885 Allergy status to narcotic agent status: Secondary | ICD-10-CM

## 2023-09-12 DIAGNOSIS — K7469 Other cirrhosis of liver: Secondary | ICD-10-CM | POA: Diagnosis present

## 2023-09-12 DIAGNOSIS — J439 Emphysema, unspecified: Secondary | ICD-10-CM | POA: Diagnosis not present

## 2023-09-12 DIAGNOSIS — I350 Nonrheumatic aortic (valve) stenosis: Secondary | ICD-10-CM | POA: Diagnosis present

## 2023-09-12 DIAGNOSIS — Z886 Allergy status to analgesic agent status: Secondary | ICD-10-CM

## 2023-09-12 DIAGNOSIS — F17210 Nicotine dependence, cigarettes, uncomplicated: Secondary | ICD-10-CM | POA: Diagnosis not present

## 2023-09-12 DIAGNOSIS — R569 Unspecified convulsions: Secondary | ICD-10-CM | POA: Diagnosis not present

## 2023-09-12 DIAGNOSIS — G2581 Restless legs syndrome: Secondary | ICD-10-CM | POA: Diagnosis present

## 2023-09-12 DIAGNOSIS — E876 Hypokalemia: Secondary | ICD-10-CM | POA: Diagnosis present

## 2023-09-12 DIAGNOSIS — Z7902 Long term (current) use of antithrombotics/antiplatelets: Secondary | ICD-10-CM

## 2023-09-12 DIAGNOSIS — R9431 Abnormal electrocardiogram [ECG] [EKG]: Secondary | ICD-10-CM | POA: Diagnosis not present

## 2023-09-12 DIAGNOSIS — Z9841 Cataract extraction status, right eye: Secondary | ICD-10-CM

## 2023-09-12 DIAGNOSIS — I110 Hypertensive heart disease with heart failure: Secondary | ICD-10-CM | POA: Diagnosis not present

## 2023-09-12 DIAGNOSIS — M069 Rheumatoid arthritis, unspecified: Secondary | ICD-10-CM | POA: Diagnosis present

## 2023-09-12 DIAGNOSIS — Z8261 Family history of arthritis: Secondary | ICD-10-CM

## 2023-09-12 DIAGNOSIS — Z888 Allergy status to other drugs, medicaments and biological substances status: Secondary | ICD-10-CM

## 2023-09-12 DIAGNOSIS — I471 Supraventricular tachycardia, unspecified: Secondary | ICD-10-CM | POA: Insufficient documentation

## 2023-09-12 DIAGNOSIS — Z79899 Other long term (current) drug therapy: Secondary | ICD-10-CM | POA: Diagnosis not present

## 2023-09-12 DIAGNOSIS — G9341 Metabolic encephalopathy: Secondary | ICD-10-CM | POA: Diagnosis present

## 2023-09-12 DIAGNOSIS — Z96611 Presence of right artificial shoulder joint: Secondary | ICD-10-CM | POA: Diagnosis not present

## 2023-09-12 DIAGNOSIS — Z880 Allergy status to penicillin: Secondary | ICD-10-CM

## 2023-09-12 DIAGNOSIS — K7581 Nonalcoholic steatohepatitis (NASH): Secondary | ICD-10-CM | POA: Diagnosis present

## 2023-09-12 DIAGNOSIS — Z881 Allergy status to other antibiotic agents status: Secondary | ICD-10-CM

## 2023-09-12 DIAGNOSIS — G40919 Epilepsy, unspecified, intractable, without status epilepticus: Secondary | ICD-10-CM | POA: Diagnosis not present

## 2023-09-12 DIAGNOSIS — Z833 Family history of diabetes mellitus: Secondary | ICD-10-CM

## 2023-09-12 DIAGNOSIS — K219 Gastro-esophageal reflux disease without esophagitis: Secondary | ICD-10-CM | POA: Diagnosis present

## 2023-09-12 DIAGNOSIS — Z825 Family history of asthma and other chronic lower respiratory diseases: Secondary | ICD-10-CM

## 2023-09-12 DIAGNOSIS — Z9842 Cataract extraction status, left eye: Secondary | ICD-10-CM

## 2023-09-12 DIAGNOSIS — R0602 Shortness of breath: Secondary | ICD-10-CM | POA: Diagnosis not present

## 2023-09-12 DIAGNOSIS — Z8673 Personal history of transient ischemic attack (TIA), and cerebral infarction without residual deficits: Secondary | ICD-10-CM

## 2023-09-12 DIAGNOSIS — K7460 Unspecified cirrhosis of liver: Secondary | ICD-10-CM | POA: Diagnosis present

## 2023-09-12 DIAGNOSIS — E785 Hyperlipidemia, unspecified: Secondary | ICD-10-CM | POA: Diagnosis present

## 2023-09-12 DIAGNOSIS — R0902 Hypoxemia: Secondary | ICD-10-CM | POA: Diagnosis not present

## 2023-09-12 DIAGNOSIS — Z961 Presence of intraocular lens: Secondary | ICD-10-CM | POA: Diagnosis present

## 2023-09-12 DIAGNOSIS — H9190 Unspecified hearing loss, unspecified ear: Secondary | ICD-10-CM | POA: Diagnosis present

## 2023-09-12 DIAGNOSIS — Z8249 Family history of ischemic heart disease and other diseases of the circulatory system: Secondary | ICD-10-CM

## 2023-09-12 LAB — CBC WITH DIFFERENTIAL/PLATELET
Abs Immature Granulocytes: 0.01 10*3/uL (ref 0.00–0.07)
Basophils Absolute: 0 10*3/uL (ref 0.0–0.1)
Basophils Relative: 1 %
Eosinophils Absolute: 0.1 10*3/uL (ref 0.0–0.5)
Eosinophils Relative: 2 %
HCT: 41.7 % (ref 36.0–46.0)
Hemoglobin: 13.6 g/dL (ref 12.0–15.0)
Immature Granulocytes: 0 %
Lymphocytes Relative: 30 %
Lymphs Abs: 1.7 10*3/uL (ref 0.7–4.0)
MCH: 27.3 pg (ref 26.0–34.0)
MCHC: 32.6 g/dL (ref 30.0–36.0)
MCV: 83.6 fL (ref 80.0–100.0)
Monocytes Absolute: 0.4 10*3/uL (ref 0.1–1.0)
Monocytes Relative: 7 %
Neutro Abs: 3.5 10*3/uL (ref 1.7–7.7)
Neutrophils Relative %: 60 %
Platelets: 244 10*3/uL (ref 150–400)
RBC: 4.99 MIL/uL (ref 3.87–5.11)
RDW: 13.8 % (ref 11.5–15.5)
WBC: 5.8 10*3/uL (ref 4.0–10.5)
nRBC: 0 % (ref 0.0–0.2)

## 2023-09-12 LAB — CK: Total CK: 69 U/L (ref 38–234)

## 2023-09-12 LAB — BASIC METABOLIC PANEL
Anion gap: 8 (ref 5–15)
BUN: 9 mg/dL (ref 8–23)
CO2: 30 mmol/L (ref 22–32)
Calcium: 8.8 mg/dL — ABNORMAL LOW (ref 8.9–10.3)
Chloride: 103 mmol/L (ref 98–111)
Creatinine, Ser: 0.59 mg/dL (ref 0.44–1.00)
GFR, Estimated: >60 mL/min (ref >60)
Glucose, Bld: 154 mg/dL — ABNORMAL HIGH (ref 70–99)
Potassium: 2.9 mmol/L — ABNORMAL LOW (ref 3.5–5.1)
Sodium: 141 mmol/L (ref 135–145)

## 2023-09-12 LAB — RESP PANEL BY RT-PCR (RSV, FLU A&B, COVID)  RVPGX2
Influenza A by PCR: NEGATIVE
Influenza B by PCR: NEGATIVE
Resp Syncytial Virus by PCR: NEGATIVE
SARS Coronavirus 2 by RT PCR: NEGATIVE

## 2023-09-12 LAB — AMMONIA: Ammonia: <10 umol/L (ref 9–35)

## 2023-09-12 LAB — MAGNESIUM: Magnesium: 1.3 mg/dL — ABNORMAL LOW (ref 1.7–2.4)

## 2023-09-12 LAB — GLUCOSE, CAPILLARY: Glucose-Capillary: 154 mg/dL — ABNORMAL HIGH (ref 70–99)

## 2023-09-12 LAB — MRSA NEXT GEN BY PCR, NASAL: MRSA by PCR Next Gen: NOT DETECTED

## 2023-09-12 LAB — D-DIMER, QUANTITATIVE: D-Dimer, Quant: 0.56 ug{FEU}/mL — ABNORMAL HIGH (ref 0.00–0.50)

## 2023-09-12 MED ORDER — LORAZEPAM 2 MG/ML IJ SOLN
1.0000 mg | Freq: Once | INTRAMUSCULAR | Status: AC
  Filled 2023-09-12: qty 1

## 2023-09-12 MED ORDER — PREDNISONE 20 MG PO TABS
40.0000 mg | ORAL_TABLET | Freq: Every day | ORAL | Status: AC
  Administered 2023-09-13 – 2023-09-16 (×4): 40 mg via ORAL
  Filled 2023-09-12: qty 4
  Filled 2023-09-12: qty 2
  Filled 2023-09-12: qty 4
  Filled 2023-09-12: qty 2

## 2023-09-12 MED ORDER — METOPROLOL TARTRATE 5 MG/5ML IV SOLN
2.5000 mg | INTRAVENOUS | Status: AC
  Administered 2023-09-12: 2.5 mg via INTRAVENOUS
  Filled 2023-09-12: qty 5

## 2023-09-12 MED ORDER — SODIUM CHLORIDE 0.9 % IV BOLUS
1000.0000 mL | Freq: Once | INTRAVENOUS | Status: AC
  Administered 2023-09-12: 1000 mL via INTRAVENOUS

## 2023-09-12 MED ORDER — IPRATROPIUM-ALBUTEROL 0.5-2.5 (3) MG/3ML IN SOLN
3.0000 mL | Freq: Once | RESPIRATORY_TRACT | Status: AC
  Administered 2023-09-12: 3 mL via RESPIRATORY_TRACT

## 2023-09-12 MED ORDER — POTASSIUM CHLORIDE 10 MEQ/100ML IV SOLN
10.0000 meq | Freq: Once | INTRAVENOUS | Status: AC
Start: 2023-09-12 — End: 2023-09-12
  Administered 2023-09-12: 10 meq via INTRAVENOUS
  Filled 2023-09-12: qty 100

## 2023-09-12 MED ORDER — IPRATROPIUM-ALBUTEROL 0.5-2.5 (3) MG/3ML IN SOLN
3.0000 mL | Freq: Four times a day (QID) | RESPIRATORY_TRACT | Status: DC
  Administered 2023-09-12 – 2023-09-13 (×4): 3 mL via RESPIRATORY_TRACT
  Filled 2023-09-12 (×4): qty 3

## 2023-09-12 MED ORDER — CHLORHEXIDINE GLUCONATE CLOTH 2 % EX PADS
6.0000 | MEDICATED_PAD | Freq: Every day | CUTANEOUS | Status: DC
  Administered 2023-09-12 – 2023-09-14 (×3): 6 via TOPICAL
  Filled 2023-09-12: qty 6

## 2023-09-12 MED ORDER — ALBUTEROL SULFATE (2.5 MG/3ML) 0.083% IN NEBU
3.0000 mL | INHALATION_SOLUTION | RESPIRATORY_TRACT | Status: DC | PRN
  Administered 2023-09-12 – 2023-09-15 (×2): 3 mL via RESPIRATORY_TRACT
  Filled 2023-09-12: qty 6
  Filled 2023-09-12 (×2): qty 3

## 2023-09-12 MED ORDER — METHYLPREDNISOLONE SODIUM SUCC 125 MG IJ SOLR
125.0000 mg | Freq: Once | INTRAMUSCULAR | Status: AC
  Administered 2023-09-12: 125 mg via INTRAVENOUS
  Filled 2023-09-12: qty 2

## 2023-09-12 MED ORDER — METHYLPREDNISOLONE SODIUM SUCC 40 MG IJ SOLR
40.0000 mg | Freq: Two times a day (BID) | INTRAMUSCULAR | Status: AC
  Administered 2023-09-12 – 2023-09-13 (×2): 40 mg via INTRAVENOUS
  Filled 2023-09-12 (×2): qty 1

## 2023-09-12 MED ORDER — POTASSIUM CHLORIDE CRYS ER 20 MEQ PO TBCR
40.0000 meq | EXTENDED_RELEASE_TABLET | Freq: Once | ORAL | Status: AC
  Administered 2023-09-12: 40 meq via ORAL
  Filled 2023-09-12: qty 2

## 2023-09-12 MED ORDER — LORAZEPAM 2 MG/ML IJ SOLN
INTRAMUSCULAR | Status: AC
Start: 2023-09-12 — End: 2023-09-12
  Administered 2023-09-12: 1 mg via INTRAVENOUS
  Filled 2023-09-12: qty 1

## 2023-09-12 MED ORDER — LEVETIRACETAM IN NACL 1500 MG/100ML IV SOLN
1500.0000 mg | Freq: Once | INTRAVENOUS | Status: AC
  Administered 2023-09-12: 1500 mg via INTRAVENOUS
  Filled 2023-09-12: qty 100

## 2023-09-12 MED ORDER — SODIUM CHLORIDE 0.9 % IV SOLN
2.0000 g | Freq: Once | INTRAVENOUS | Status: AC
  Administered 2023-09-12: 2 g via INTRAVENOUS
  Filled 2023-09-12: qty 20

## 2023-09-12 MED ORDER — ORAL CARE MOUTH RINSE
15.0000 mL | OROMUCOSAL | Status: DC
  Administered 2023-09-12 – 2023-09-14 (×9): 15 mL via OROMUCOSAL
  Filled 2023-09-12: qty 15

## 2023-09-12 MED ORDER — POTASSIUM CHLORIDE 10 MEQ/100ML IV SOLN
10.0000 meq | INTRAVENOUS | Status: AC
  Administered 2023-09-12 (×2): 10 meq via INTRAVENOUS
  Filled 2023-09-12 (×2): qty 100

## 2023-09-12 MED ORDER — LORAZEPAM 2 MG/ML IJ SOLN
2.0000 mg | Freq: Once | INTRAMUSCULAR | Status: AC
  Administered 2023-09-12: 2 mg via INTRAVENOUS

## 2023-09-12 MED ORDER — ENOXAPARIN SODIUM 40 MG/0.4ML IJ SOSY
40.0000 mg | PREFILLED_SYRINGE | INTRAMUSCULAR | Status: DC
  Administered 2023-09-12 – 2023-09-15 (×4): 40 mg via SUBCUTANEOUS
  Filled 2023-09-12 (×4): qty 0.4

## 2023-09-12 MED ORDER — MAGNESIUM SULFATE 4 GM/100ML IV SOLN
4.0000 g | Freq: Once | INTRAVENOUS | Status: AC
  Administered 2023-09-12: 4 g via INTRAVENOUS
  Filled 2023-09-12: qty 100

## 2023-09-12 MED ORDER — ORAL CARE MOUTH RINSE: 15.0000 mL | OROMUCOSAL | Status: DC | PRN

## 2023-09-12 MED ORDER — SODIUM CHLORIDE 0.9 % IV SOLN
500.0000 mg | Freq: Once | INTRAVENOUS | Status: AC
  Administered 2023-09-12: 500 mg via INTRAVENOUS
  Filled 2023-09-12: qty 5

## 2023-09-12 MED ORDER — IPRATROPIUM-ALBUTEROL 0.5-2.5 (3) MG/3ML IN SOLN
3.0000 mL | Freq: Once | RESPIRATORY_TRACT | Status: AC
  Administered 2023-09-12: 3 mL via RESPIRATORY_TRACT
  Filled 2023-09-12: qty 3

## 2023-09-12 NOTE — ED Notes (Signed)
 Advised nurse that patient has ready bed

## 2023-09-12 NOTE — ED Notes (Signed)
 Pt roomed to ED9---pt is A&O x4, communicative via lip reading and mouthing words back to this RN. Interpreter stick at the bedside, patient refusing at this time.

## 2023-09-12 NOTE — ED Notes (Signed)
 Seizure activity has ceased. Patient is lethargic but communicative at this time.

## 2023-09-12 NOTE — ED Notes (Signed)
 Patient tolerating Bipap appropriately at this time.

## 2023-09-12 NOTE — ED Triage Notes (Signed)
 Shortness of breath x 2-3 days. Had breathing tx this morning. Normally wears 2L O2; not wearing any upon arrival to ED. Satting 90% on RA in Triage; placed on 2L.

## 2023-09-12 NOTE — ED Notes (Signed)
 RT to bedside. Pt placed on bipap

## 2023-09-12 NOTE — Progress Notes (Signed)
 Dr. Chipper Herb at bedside and gave order for 2.5 mg IV metoprolol once. MD discussed patient's heart rate in 130's, sinus tach on monitor.

## 2023-09-12 NOTE — ED Notes (Signed)
 Patient to CT with this RN and Britt Boozer, paramedic.

## 2023-09-12 NOTE — H&P (Signed)
 History and Physical    Patient: [name] QMV:[id number] DOB: [birth year] DOA: 09/12/2023 DOS: the patient was seen and examined on 09/12/2023 PCP: [name], NP  Patient coming from: Home  Chief Complaint:  Chief Complaint  Patient presents with   Shortness of Breath   HPI: [name] is a 73 y.o. female with medical history significant of chronic diastolic congestive heart failure, moderate aortic stenosis, liver cirrhosis, COPD, chronic hypoxemia on 2 L oxygen, type 2 diabetes, essential hypertension, rheumatoid arthritis, obstructive sleep apnea and a history of stroke, seizure disorder, who presents to the hospital with 3 to 4 days of short of breath and cough.  Upon arriving the hospital, she was found seizing, she was given Ativan.  Postseizure, patient become unresponsive, worsening hypoxemia, was placed on BiPAP.  Currently, patient is unresponsive.  Spoke with patient's son, patient has been sick for the last 3 or 4 days with worsening short of breath, cough with large amount mucus.  She has not been eating, but does not have any diarrhea.  No fever or chills.  Has not been complaining any dysuria or hematuria.  Results showed potassium of 2.9, renal function still normal, no leukocytosis.  Chest x-ray did not show any acute changes.  She was given Rocephin Vizumax, Solu-Medrol 125 mg, Keppra 1500 mg loaded.  Patient will be admitted to stepdown unit.  Review of Systems: As mentioned in the history of present illness. All other systems reviewed and are negative. Past Medical History:  Diagnosis Date   Asthma    CHF (congestive heart failure) (HCC)    Cirrhosis, non-alcoholic (HCC)    COPD (chronic obstructive pulmonary disease) (HCC)    Deaf    Depression    Diabetes mellitus without complication (HCC)    GERD (gastroesophageal reflux disease)    Heart murmur    Hepatitis    History of rheumatic fever    History of scarlet fever    Hypertension    IBS  (irritable bowel syndrome)    Lymph node disorder    arm   Neuropathy    On home oxygen therapy    hs   Orthopnea    Osteoarthritis    RA (rheumatoid arthritis) (HCC)    RLS (restless legs syndrome)    Seizures (HCC)    Shortness of breath dyspnea    Sleep apnea    Stroke (HCC)    tia   Past Surgical History:  Procedure Laterality Date   ABDOMINAL HYSTERECTOMY     BREAST BIOPSY Right 10/30/2020   Stereo Bx, X-clip,  BENIGN BREAST TISSUE WITH FIBROADENOMATOUS   CATARACT EXTRACTION W/PHACO Right 11/23/2014   Procedure: CATARACT EXTRACTION PHACO AND INTRAOCULAR LENS PLACEMENT (IOC);  Surgeon: Lia Hopping, MD;  Location: [facility];  Service: Ophthalmology;  Laterality: Right;   CATARACT EXTRACTION W/PHACO Left 12/14/2014   Procedure: CATARACT EXTRACTION PHACO AND INTRAOCULAR LENS PLACEMENT (IOC);  Surgeon: Lia Hopping, MD;  Location: [facility];  Service: Ophthalmology;  Laterality: Left;  US:01:16.6 AP:15.8 CDE:12.14   CESAREAN SECTION     CHOLECYSTECTOMY     COLONOSCOPY WITH PROPOFOL N/A 10/08/2020   Procedure: COLONOSCOPY WITH PROPOFOL;  Surgeon: Toledo, Boykin Nearing, MD;  Location: [facility];  Service: Gastroenterology;  Laterality: N/A;   ESOPHAGOGASTRODUODENOSCOPY (EGD) WITH PROPOFOL N/A 10/08/2020   Procedure: ESOPHAGOGASTRODUODENOSCOPY (EGD) WITH PROPOFOL;  Surgeon: Toledo, Boykin Nearing, MD;  Location: [facility];  Service: Gastroenterology;  Laterality: N/A;  DM DEAF, NEEDS SIGN INTERPRETER PER SON  REVERSE SHOULDER ARTHROPLASTY Right 12/21/2019   Procedure: REVERSE SHOULDER ARTHROPLASTY;  Surgeon: Lyndle Herrlich, MD;  Location: [facility];  Service: Orthopedics;  Laterality: Right;   THUMB ARTHROSCOPY     TONSILLECTOMY     TYMPANOPLASTY     muliple   Social History:  reports that she has been smoking cigarettes. She started smoking about 55 years ago. She has a 27.8 pack-year smoking history. She has never used smokeless tobacco. She reports that she does not  drink alcohol and does not use drugs.  Allergies  Allergen Reactions   Celebrex [Celecoxib] Itching    itching   Ciprofloxacin Itching   Codeine Itching   Fosphenytoin Itching and Other (See Comments)   Levaquin [Levofloxacin In D5w] Itching   Levofloxacin Itching   Lovastatin Itching   Pravastatin Itching   Sulfa Antibiotics Itching   Aspirin Itching and Rash   Azithromycin Rash   Penicillins Rash    Documentation indicates severe reaction  Pt tolerated cephalosporin without adverse reaction 09/18     Family History  Problem Relation Age of Onset   Lung cancer Mother    CAD Father    Miscarriages / Stillbirths Sister    Hyperlipidemia Sister    Hypertension Sister    Diabetes Sister    COPD Sister    Asthma Sister    Arthritis Sister    Breast cancer Sister    Heart attack Sister    Hyperlipidemia Sister    Heart disease Sister    Heart attack Sister    Diabetes Sister    Cancer Sister    Asthma Sister    Heart disease Brother    Heart attack Brother    Depression Brother    Hypertension Son    Hyperlipidemia Son    Heart disease Son    Depression Son    Hypertension Daughter    Depression Daughter    Asthma Daughter    Birth defects Paternal Uncle    Hearing loss Brother     Prior to Admission medications   Medication Sig Start Date End Date Taking? Authorizing Provider  acyclovir ointment (ZOVIRAX) 5 % Apply 1 Application topically 5 (five) times daily. X 4 days. Start at onset of symptoms. 07/21/23  Yes [name], NP  citalopram (CELEXA) 10 MG tablet TAKE 1 TABLET BY MOUTH DAILY 01/16/23  Yes [name], NP  clopidogrel (PLAVIX) 75 MG tablet TAKE 1 TABLET BY MOUTH DAILY 06/09/23  Yes [name], NP  doxycycline (VIBRA-TABS) 100 MG tablet Take 1 tablet (100 mg total) by mouth 2 (two) times daily. Patient not taking: Reported on 09/12/2023 07/06/23   Jene Every, MD  fluticasone [facility]) 50 MCG/ACT nasal spray Place 1 spray into both nostrils 2  (two) times daily.   Yes [provider]  furosemide (LASIX) 40 MG tablet Take 1 tablet (40 mg total) by mouth daily. 03/05/23  Yes [name], NP  gabapentin (NEURONTIN) 100 MG capsule Take 2 capsules (200 mg total) by mouth at bedtime. 05/11/23  Yes [name], NP  glimepiride (AMARYL) 2 MG tablet TAKE 1 TABLET BY MOUTH DAILY FOR DIABETES Patient taking differently: Take 2 mg by mouth daily with breakfast. 02/19/23  Yes [name], NP  ipratropium (ATROVENT) 0.02 % nebulizer solution Take 2.5 mLs (0.5 mg total) by nebulization 4 (four) times daily. 07/06/23  Yes Jene Every, MD  ipratropium-albuterol (DUONEB) 0.5-2.5 (3) MG/3ML SOLN INHALE 1 VIAL USING NEBULIZER EVERY SIX HOURS AS NEEDED FOR SHORTNESS OF  BREATH 07/17/23  Yes Worthy Rancher B, FNP  isosorbide mononitrate (IMDUR) 30 MG 24 hr tablet Take 30 mg by mouth daily. 03/20/23 03/19/24 Yes [provider]  Lacosamide (VIMPAT) 150 MG TABS Take 150 mg by mouth every 12 (twelve) hours.   Yes [provider]  lacosamide (VIMPAT) 50 MG TABS tablet Take twice a day along with the 150 mg tabs for 5 days Patient not taking: Reported on 09/12/2023 06/26/23   Kathrynn Running, MD  losartan (COZAAR) 100 MG tablet TAKE 1 TABLET BY MOUTH DAILY AS DIRECTED 03/17/23  Yes [name], NP  metoprolol succinate (TOPROL-XL) 25 MG 24 hr tablet TAKE 1 TABLET BY MOUTH DAILY 03/17/23  Yes [name], NP  montelukast (SINGULAIR) 10 MG tablet TAKE 1 TABLET BY MOUTH AT BEDTIME 12/10/22  Yes [name], NP  pantoprazole (PROTONIX) 40 MG tablet TAKE 1 TABLET BY MOUTH DAILY FOR REFLUX/HEARTBURN 12/10/22  Yes [name], NP  potassium chloride (KLOR-CON) 10 MEQ tablet TAKE 2 TABLETS BY MOUTH DAILY 05/11/23  Yes [name], NP  rOPINIRole (REQUIP) 0.5 MG tablet Take 1 tablet (0.5 mg total) by mouth at bedtime. 03/29/23  Yes Enedina Finner, MD  rosuvastatin (CRESTOR) 10 MG tablet TAKE 1 TABLET BY MOUTH AT BEDTIME 03/24/23  Yes [name], NP   spironolactone (ALDACTONE) 25 MG tablet TAKE 1 TABLET BY MOUTH EVERY MORNING Patient not taking: Reported on 09/12/2023 01/29/23   [name], NP  traMADol (ULTRAM) 50 MG tablet Take 1 tablet (50 mg total) by mouth every 12 (twelve) hours as needed for moderate pain or severe pain. Patient not taking: Reported on 09/12/2023 03/29/23   Enedina Finner, MD    Physical Exam: Vitals:   09/12/23 1223 09/12/23 1230 09/12/23 1245 09/12/23 1300  BP:  (!) 147/93 120/85 (!) 119/98  Pulse: (!) 132 (!) 133 (!) 134 (!) 134  Resp: (!) 27 (!) 30 (!) 47 (!) 30  Temp:      TempSrc:      SpO2: 98% 99% 97% 100%  Weight:      Height:       Physical Exam Constitutional:      Comments: Ill-appearing, unresponsive to verbal command, responsive to painful stimuli.  Does not open eyes.  HENT:     Head: Normocephalic and atraumatic.  Eyes:     Extraocular Movements: Extraocular movements intact.     Pupils: Pupils are equal, round, and reactive to light.  Neck:     Thyroid: No thyromegaly.     Vascular: No hepatojugular reflux.  Cardiovascular:     Rate and Rhythm: Regular rhythm. Tachycardia present.     Heart sounds: No murmur heard.    No gallop.  Pulmonary:     Effort: Tachypnea present.     Comments: Decreased breath sounds without wheezes or crackles. Abdominal:     General: Bowel sounds are normal.     Palpations: Abdomen is soft.     Tenderness: There is no abdominal tenderness. There is no rebound.  Musculoskeletal:     Cervical back: Neck supple.     Right lower leg: No edema.     Left lower leg: No edema.  Skin:    General: Skin is warm and dry.     Coloration: Skin is not cyanotic.  Neurological:     Comments: Unresponsive to verbal command, withdraws to painful stimuli. No focal weakness.     Data Reviewed:  Gastric x-ray without acute changes, labs results showed significant hypokalemia  2.9.  Assessment and Plan: COPD exacerbation. Patient recently had a worsening short of  breath and a cough.  Consistent with COPD exacerbation.  Patient will be treated with steroids and scheduled bronchodilator.  Chest x-ray did not show any evidence of pneumonia, will hold off antibiotics.  Acute on chronic respiratory failure with hypoxemia. Multifactorial, secondary to COPD exacerbation, possibly total from breakthrough seizure, sedation from Ativan.  Patient was chronically on 2 L oxygen. Patient currently is unresponsive, but does not seem to have any upper airway secretion.  She seems to be tolerating BiPAP well.  Will continue BiPAP until patient wakes up.  Acute encephalopathy. Most likely postictal versus sedation from Ativan.  Continue monitor closely. N.p.o. until patient wakes up.   Breakthrough seizure. Spoke with the family, every time patient comes to the hospital with respiratory symptoms, she always has a seizure.  She has been seen by neurology, loaded with Keppra, also started on Vimpat.  Chronic diastolic congestive heart failure. Moderate aortic stenosis. Patient has no evidence of volume overload this time.  Hypokalemia. Give 30 mEq of KCl IV.  Recheck levels tomorrow.  Liver cirrhosis. Follow-up with PCP as outpatient.  Obstructive sleep apnea. Continue BiPAP for now.  History of stroke. Follow-up with family physician after discharge.  Prognosis. Patient currently is very safe, prognosis guarded.  Will monitor closely in ICU stepdown unit.   Advance Care Planning:   Code Status: Full Code patient could not make decision at this time, discussed with patient's son, preferred to be full code.  Okay to intubate if needed.  Consults: Neurology.  Family Communication: As above.  CRITICAL CARE Performed by: Marrion Coy   Total critical care time: 60 minutes  Critical care time was exclusive of separately billable procedures and treating other patients.  Critical care was necessary to treat or prevent imminent or life-threatening  deterioration.  Critical care was time spent personally by me on the following activities: development of treatment plan with patient and/or surrogate as well as nursing, discussions with consultants, evaluation of patient's response to treatment, examination of patient, obtaining history from patient or surrogate, ordering and performing treatments and interventions, ordering and review of laboratory studies, ordering and review of radiographic studies, pulse oximetry and re-evaluation of patient's condition.  Author: Marrion Coy, MD 09/12/2023 1:49 PM  For on call review [email or web address].

## 2023-09-12 NOTE — Consult Note (Signed)
 NEUROLOGY CONSULT NOTE   Date of service: September 12, 2023 Patient Name: [name] MRN:  [id number] DOB:  Sep 19, 1950 Chief Complaint: "Seizure." Requesting Provider: Marrion Coy, MD  History of Present Illness  [name] is a 73 y.o. female with hx of deafness requiring ALS interpreter-can lip read, known history of seizures on Vimpat, as well as documented history of pseudoseizures, diabetes, hypertension, CHF, cirrhosis secondary to [place], COPD, obstructive sleep apnea not tolerant to CPAP on 2 L oxygen at home with unclear compliance, depression, RLS, memory loss and B12 deficiency brought in for evaluation of shortness of breath involving treated in the ER had a seizure that lasted about 10 minutes but was able to come around and make her needs known to the RNs after that. Neurology was consulted for breakthrough seizure She has been seen multiple times in the past few years by our service in the setting of noncompliance to COPD treatments leading to multiple seizure-like episodes, a lot of which on EEG have been captured to be nonepileptic but also has had electrographic seizures captured on EEG at different times.     ROS  Comprehensive ROS performed and pertinent positives documented in HPI   Past History   Past Medical History:  Diagnosis Date   Asthma    CHF (congestive heart failure) (HCC)    Cirrhosis, non-alcoholic (HCC)    COPD (chronic obstructive pulmonary disease) (HCC)    Deaf    Depression    Diabetes mellitus without complication (HCC)    GERD (gastroesophageal reflux disease)    Heart murmur    Hepatitis    History of rheumatic fever    History of scarlet fever    Hypertension    IBS (irritable bowel syndrome)    Lymph node disorder    arm   Neuropathy    On home oxygen therapy    hs   Orthopnea    Osteoarthritis    RA (rheumatoid arthritis) (HCC)    RLS (restless legs syndrome)    Seizures (HCC)    Shortness of breath dyspnea    Sleep apnea     Stroke (HCC)    tia    Past Surgical History:  Procedure Laterality Date   ABDOMINAL HYSTERECTOMY     BREAST BIOPSY Right 10/30/2020   Stereo Bx, X-clip,  BENIGN BREAST TISSUE WITH FIBROADENOMATOUS   CATARACT EXTRACTION W/PHACO Right 11/23/2014   Procedure: CATARACT EXTRACTION PHACO AND INTRAOCULAR LENS PLACEMENT (IOC);  Surgeon: Lia Hopping, MD;  Location: [facility];  Service: Ophthalmology;  Laterality: Right;   CATARACT EXTRACTION W/PHACO Left 12/14/2014   Procedure: CATARACT EXTRACTION PHACO AND INTRAOCULAR LENS PLACEMENT (IOC);  Surgeon: Lia Hopping, MD;  Location: [facility];  Service: Ophthalmology;  Laterality: Left;  US:01:16.6 AP:15.8 CDE:12.14   CESAREAN SECTION     CHOLECYSTECTOMY     COLONOSCOPY WITH PROPOFOL N/A 10/08/2020   Procedure: COLONOSCOPY WITH PROPOFOL;  Surgeon: Toledo, Boykin Nearing, MD;  Location: [facility];  Service: Gastroenterology;  Laterality: N/A;   ESOPHAGOGASTRODUODENOSCOPY (EGD) WITH PROPOFOL N/A 10/08/2020   Procedure: ESOPHAGOGASTRODUODENOSCOPY (EGD) WITH PROPOFOL;  Surgeon: Toledo, Boykin Nearing, MD;  Location: [facility];  Service: Gastroenterology;  Laterality: N/A;  DM DEAF, NEEDS SIGN INTERPRETER PER SON   REVERSE SHOULDER ARTHROPLASTY Right 12/21/2019   Procedure: REVERSE SHOULDER ARTHROPLASTY;  Surgeon: Lyndle Herrlich, MD;  Location: [facility];  Service: Orthopedics;  Laterality: Right;   THUMB ARTHROSCOPY     TONSILLECTOMY     TYMPANOPLASTY  muliple    Family History: Family History  Problem Relation Age of Onset   Lung cancer Mother    CAD Father    Miscarriages / Stillbirths Sister    Hyperlipidemia Sister    Hypertension Sister    Diabetes Sister    COPD Sister    Asthma Sister    Arthritis Sister    Breast cancer Sister    Heart attack Sister    Hyperlipidemia Sister    Heart disease Sister    Heart attack Sister    Diabetes Sister    Cancer Sister    Asthma Sister    Heart disease Brother    Heart attack  Brother    Depression Brother    Hypertension Son    Hyperlipidemia Son    Heart disease Son    Depression Son    Hypertension Daughter    Depression Daughter    Asthma Daughter    Birth defects Paternal Uncle    Hearing loss Brother     Social History  reports that she has been smoking cigarettes. She started smoking about 55 years ago. She has a 27.8 pack-year smoking history. She has never used smokeless tobacco. She reports that she does not drink alcohol and does not use drugs.  Allergies  Allergen Reactions   Celebrex [Celecoxib] Itching    itching   Ciprofloxacin Itching   Codeine Itching   Fosphenytoin Itching and Other (See Comments)   Levaquin [Levofloxacin In D5w] Itching   Levofloxacin Itching   Lovastatin Itching   Pravastatin Itching   Sulfa Antibiotics Itching   Aspirin Itching and Rash   Azithromycin Rash   Penicillins Rash    Documentation indicates severe reaction  Pt tolerated cephalosporin without adverse reaction 09/18     Medications   Current Facility-Administered Medications:    albuterol (PROVENTIL) (2.5 MG/3ML) 0.083% nebulizer solution 3 mL, 3 mL, Nebulization, Q2H PRN, Mumma, Shannon, MD, 3 mL at 09/12/23 1010  Current Outpatient Medications:    acyclovir ointment (ZOVIRAX) 5 %, Apply 1 Application topically 5 (five) times daily. X 4 days. Start at onset of symptoms., Disp: 15 g, Rfl: 5   citalopram (CELEXA) 10 MG tablet, TAKE 1 TABLET BY MOUTH DAILY, Disp: 90 tablet, Rfl: 3   clopidogrel (PLAVIX) 75 MG tablet, TAKE 1 TABLET BY MOUTH DAILY, Disp: 90 tablet, Rfl: 1   doxycycline (VIBRA-TABS) 100 MG tablet, Take 1 tablet (100 mg total) by mouth 2 (two) times daily. (Patient not taking: Reported on 09/12/2023), Disp: 14 tablet, Rfl: 0   fluticasone (FLONASE) 50 MCG/ACT nasal spray, Place 1 spray into both nostrils 2 (two) times daily., Disp: , Rfl:    furosemide (LASIX) 40 MG tablet, Take 1 tablet (40 mg total) by mouth daily., Disp: 90 tablet,  Rfl: 3   gabapentin (NEURONTIN) 100 MG capsule, Take 2 capsules (200 mg total) by mouth at bedtime., Disp: 180 capsule, Rfl: 3   glimepiride (AMARYL) 2 MG tablet, TAKE 1 TABLET BY MOUTH DAILY FOR DIABETES (Patient taking differently: Take 2 mg by mouth daily with breakfast.), Disp: 90 tablet, Rfl: 3   ipratropium (ATROVENT) 0.02 % nebulizer solution, Take 2.5 mLs (0.5 mg total) by nebulization 4 (four) times daily., Disp: 75 mL, Rfl: 12   ipratropium-albuterol (DUONEB) 0.5-2.5 (3) MG/3ML SOLN, INHALE 1 VIAL USING NEBULIZER EVERY SIX HOURS AS NEEDED FOR SHORTNESS OF BREATH, Disp: 90 mL, Rfl: 1   isosorbide mononitrate (IMDUR) 30 MG 24 hr tablet, Take 30 mg by mouth  daily., Disp: , Rfl:    Lacosamide (VIMPAT) 150 MG TABS, Take 150 mg by mouth every 12 (twelve) hours., Disp: , Rfl:    lacosamide (VIMPAT) 50 MG TABS tablet, Take twice a day along with the 150 mg tabs for 5 days, Disp: 10 tablet, Rfl: 1   losartan (COZAAR) 100 MG tablet, TAKE 1 TABLET BY MOUTH DAILY AS DIRECTED, Disp: 90 tablet, Rfl: 3   metoprolol succinate (TOPROL-XL) 25 MG 24 hr tablet, TAKE 1 TABLET BY MOUTH DAILY, Disp: 90 tablet, Rfl: 3   montelukast (SINGULAIR) 10 MG tablet, TAKE 1 TABLET BY MOUTH AT BEDTIME, Disp: 90 tablet, Rfl: 3   pantoprazole (PROTONIX) 40 MG tablet, TAKE 1 TABLET BY MOUTH DAILY FOR REFLUX/HEARTBURN, Disp: 90 tablet, Rfl: 3   potassium chloride (KLOR-CON) 10 MEQ tablet, TAKE 2 TABLETS BY MOUTH DAILY, Disp: 180 tablet, Rfl: 3   rOPINIRole (REQUIP) 0.5 MG tablet, Take 1 tablet (0.5 mg total) by mouth at bedtime., Disp: 30 tablet, Rfl: 0   rosuvastatin (CRESTOR) 10 MG tablet, TAKE 1 TABLET BY MOUTH AT BEDTIME, Disp: 90 tablet, Rfl: 3   spironolactone (ALDACTONE) 25 MG tablet, TAKE 1 TABLET BY MOUTH EVERY MORNING (Patient not taking: Reported on 14-Sep-2023), Disp: 90 tablet, Rfl: 0   traMADol (ULTRAM) 50 MG tablet, Take 1 tablet (50 mg total) by mouth every 12 (twelve) hours as needed for moderate pain or severe  pain. (Patient not taking: Reported on 09/14/23), Disp: 15 tablet, Rfl: 0  Vitals   Vitals:   09-14-2023 1223 September 14, 2023 1230 09-14-2023 1245 09-14-2023 1300  BP:  (!) 147/93 120/85 (!) 119/98  Pulse: (!) 132 (!) 133 (!) 134 (!) 134  Resp: (!) 27 (!) 30 (!) 47 (!) 30  Temp:      TempSrc:      SpO2: 98% 99% 97% 100%  Weight:      Height:        Body mass index is 25.69 kg/m.  Physical Exam  General: Elderly woman on BiPAP HEENT: Normocephalic dermatic Lungs: Clear Neurological exam She is somewhat drowsy, opens eyes to voice, able to lip read and nods yes and no appropriately, follows commands. Cranial nerves: Pupils equal round react light, extract movements intact, facial symmetry preserved. Motor examination: Antigravity in both upper extremities but has obvious pain in the right shoulder-this has been documented on prior exams as well due to rotator cuff injury.  Is able to wiggle toes to command and raised antigravity in both lower extremities although appears generally weak. Sensation intact light touch Coordination was not tested    Labs/Imaging/Neurodiagnostic studies   CBC:  Recent Labs  Lab 09-14-2023 0909  WBC 5.8  NEUTROABS 3.5  HGB 13.6  HCT 41.7  MCV 83.6  PLT 244   Basic Metabolic Panel:  Lab Results  Component Value Date   NA 141 14-Sep-2023   K 2.9 (L) 09-14-23   CO2 30 September 14, 2023   GLUCOSE 154 (H) 09-14-23   BUN 9 09/14/23   CREATININE 0.59 2023-09-14   CALCIUM 8.8 (L) 14-Sep-2023   GFRNONAA >60 09/14/2023   GFRAA >60 12/24/2019   Lipid Panel:  Lab Results  Component Value Date   LDLCALC 60 11/19/2022   HgbA1c:  Lab Results  Component Value Date   HGBA1C 7.1 (H) 03/19/2023   Urine Drug Screen:     Component Value Date/Time   LABOPIA NONE DETECTED 03/31/2022 0753   COCAINSCRNUR NONE DETECTED 03/31/2022 0753   LABBENZ POSITIVE (A) 03/31/2022 0753  AMPHETMU NONE DETECTED 03/31/2022 0753   THCU NONE DETECTED 03/31/2022 0753   LABBARB  NONE DETECTED 03/31/2022 0753    Alcohol Level     Component Value Date/Time   ETH <10 09/29/2021 2136   INR  Lab Results  Component Value Date   INR 1.0 06/23/2023   APTT  Lab Results  Component Value Date   APTT 27 06/23/2023      CT Head without contrast(Personally reviewed): No acute intracranial abnormality.    ASSESSMENT   JAHMIA BERRETT is a 73 y.o. female with above past medical history, presenting for evaluation of shortness of breath and had seizure-like activity witnessed by the nursing staff at bedside that lasted about 10 minutes. She is back to what seems to be her normal baseline and is being treated for her COPD with BiPAP. She has had similar episodes of seizures and PNES while dealing with her respiratory and other issues and also had electrographic seizures during her presentation. Her exam looks reassuring and it does not look like she might be seizing at this time. I would continue her home antiepileptics for now and monitor clinically for any recurrence of seizure activity. If seizure activity recurs, may need to readdress her medication regimen and also might have to obtain EEG.  Impression: Breakthrough seizure in the setting of systemic illness  RECOMMENDATIONS  Vimpat 100 twice daily-Home dose-continue. Ativan PRN only if seizure lasts more than 5 minutes-please also call neurology at that time. Seizure precautions Management of COPD and respiratory derangements per primary team. Check for any underlying evidence of infection-UTI or pneumonia and treat per primary team. If she continues to have seizure activity, please recall [place] back-may need to consider escalating the dose of Vimpat and also consideration for transfer for EEG depending on the clinical exam. Plan was relayed to Dr. Chipper Herb and Junious Silk, NP. ______________________________________________________________________    Signed, Milon Dikes, MD [facility]

## 2023-09-12 NOTE — ED Notes (Signed)
Patient placed on 5L NC.

## 2023-09-12 NOTE — ED Provider Notes (Signed)
 [facility] Provider Note    Event Date/Time   First MD Initiated Contact with Patient 09/12/23 1000     (approximate)   History   Shortness of Breath   HPI  [name] is a 73 y.o. female with a history of CHF, cirrhosis, COPD presents to the ER for evaluation of shortness of breath.  Denies any significant pain.  She is on Plavix.  Last hospitalization was in December of last year.  Denies any vomiting nausea or diarrhea.     Physical Exam   Triage Vital Signs: ED Triage Vitals  Encounter Vitals Group     BP 09/12/23 0902 (!) 149/85     Systolic BP Percentile --      Diastolic BP Percentile --      Pulse Rate 09/12/23 0902 (!) 125     Resp 09/12/23 0902 (!) 22     Temp 09/12/23 0902 97.8 F (36.6 C)     Temp Source 09/12/23 0902 Oral     SpO2 09/12/23 0902 93 %     Weight 09/12/23 0903 145 lb (65.8 kg)     Height 09/12/23 0903 5\' 3"  (1.6 m)     Head Circumference --      Peak Flow --      Pain Score 09/12/23 0903 0     Pain Loc --      Pain Education --      Exclude from Growth Chart --     Most recent vital signs: Vitals:   09/12/23 1200 09/12/23 1223  BP: (!) 139/106   Pulse: (!) 140 (!) 132  Resp: (!) 26 (!) 27  Temp:    SpO2: 91% 98%     Constitutional: Alert  Eyes: Conjunctivae are normal.  Head: Atraumatic. Nose: No congestion/rhinnorhea. Mouth/Throat: Mucous membranes are moist.   Neck: Painless ROM.  Cardiovascular:   Good peripheral circulation. Respiratory: Normal respiratory effort.  No retractions.  Gastrointestinal: Soft and nontender.  Musculoskeletal:  no deformity Neurologic:  MAE spontaneously. No gross focal neurologic deficits are appreciated.  Skin:  Skin is warm, dry and intact. No rash noted. Psychiatric: Mood and affect are normal. Speech and behavior are normal.    ED Results / Procedures / Treatments   Labs (all labs ordered are listed, but only abnormal results are displayed) Labs  Reviewed  BASIC METABOLIC PANEL - Abnormal; Notable for the following components:      Result Value   Potassium 2.9 (*)    Glucose, Bld 154 (*)    Calcium 8.8 (*)    All other components within normal limits  BLOOD GAS, VENOUS - Abnormal; Notable for the following components:   pO2, Ven <31 (*)    Bicarbonate 34.4 (*)    Acid-Base Excess 8.2 (*)    All other components within normal limits  RESP PANEL BY RT-PCR (RSV, FLU A&B, COVID)  [id number]  CULTURE, BLOOD (ROUTINE X 2)  CULTURE, BLOOD (ROUTINE X 2)  CBC WITH DIFFERENTIAL/PLATELET  AMMONIA  D-DIMER, QUANTITATIVE     EKG  ED ECG REPORT I, Willy Eddy, the attending physician, personally viewed and interpreted this ECG.   Date: 09/12/2023  EKG Time: 9:07  Rate: 125  Rhythm: sinus  Axis: normal  Intervals: normal  ST&T Change: no stemi, nonspecific st abn    RADIOLOGY Please see ED Course for my review and interpretation.  I personally reviewed all radiographic images ordered to evaluate for the above acute complaints and  reviewed radiology reports and findings.  These findings were personally discussed with the patient.  Please see medical record for radiology report.    PROCEDURES:  Critical Care performed: Yes, see critical care procedure note(s)  .Critical Care  Performed by: Willy Eddy, MD Authorized by: Willy Eddy, MD   Critical care provider statement:    Critical care time (minutes):  40   Critical care was necessary to treat or prevent imminent or life-threatening deterioration of the following conditions:  Respiratory failure   Critical care was time spent personally by me on the following activities:  Ordering and performing treatments and interventions, ordering and review of laboratory studies, ordering and review of radiographic studies, pulse oximetry, re-evaluation of patient's condition, review of old charts, obtaining history from patient or surrogate, examination of patient,  evaluation of patient's response to treatment, discussions with primary provider, discussions with consultants and development of treatment plan with patient or surrogate    MEDICATIONS ORDERED IN ED: Medications  albuterol (PROVENTIL) (2.5 MG/3ML) 0.083% nebulizer solution 3 mL (3 mLs Nebulization Given 09/12/23 1010)  azithromycin (ZITHROMAX) 500 mg in sodium chloride 0.9 % 250 mL IVPB (500 mg Intravenous New Bag/Given 09/12/23 1154)  ipratropium-albuterol (DUONEB) 0.5-2.5 (3) MG/3ML nebulizer solution 3 mL (3 mLs Nebulization Given 09/12/23 1010)  potassium chloride SA (KLOR-CON M) CR tablet 40 mEq (40 mEq Oral Given 09/12/23 1009)  sodium chloride 0.9 % bolus 1,000 mL (1,000 mLs Intravenous New Bag/Given 09/12/23 1113)  methylPREDNISolone sodium succinate (SOLU-MEDROL) 125 mg/2 mL injection 125 mg (125 mg Intravenous Given 09/12/23 1112)  levETIRAcetam (KEPPRA) IVPB 1500 mg/ 100 mL premix (0 mg Intravenous Stopped 09/12/23 1131)  LORazepam (ATIVAN) injection 1 mg (1 mg Intravenous Given 09/12/23 1105)  ipratropium-albuterol (DUONEB) 0.5-2.5 (3) MG/3ML nebulizer solution 3 mL (3 mLs Nebulization Given 09/12/23 1117)  ipratropium-albuterol (DUONEB) 0.5-2.5 (3) MG/3ML nebulizer solution 3 mL (3 mLs Nebulization Given 09/12/23 1117)  cefTRIAXone (ROCEPHIN) 2 g in sodium chloride 0.9 % 100 mL IVPB (0 g Intravenous Stopped 09/12/23 1205)  LORazepam (ATIVAN) injection 2 mg (2 mg Intravenous Given 09/12/23 1052)     IMPRESSION / MDM / ASSESSMENT AND PLAN / ED COURSE  I reviewed the triage vital signs and the nursing notes.                              Differential diagnosis includes, but is not limited to, Asthma, copd, CHF, pna, ptx, malignancy, Pe, anemia  Patient presenting to the ER for evaluation of symptoms as described above.  Based on symptoms, risk factors and considered above differential, this presenting complaint could reflect a potentially life-threatening illness therefore the patient will be placed on  continuous pulse oximetry and telemetry for monitoring.  Laboratory evaluation will be sent to evaluate for the above complaints.      Clinical Course as of 09/12/23 1249  Sat Sep 12, 2023  1056 Paramedic was obtaining IV patient had witnessed seizure-like activity.  Eyes rhythmically going to the right generalized tonic-clonic.  Lasting roughly 3 minutes.  Patient given Ativan with improvement in symptoms.  Medic reports that the patient was complaining of a headache prior to seizure like activity.  She is currently protecting her airway.  Does not appear to be in status.  Will give Keppra.  Will order CT head to further evaluate. [PR]  1112 Patient had another brief episode of seizure-like activity.  Second dose of Ativan was ordered.  While  administering Ativan she had cessation of additional seizure-like activity.  Second episode was very brief in nature.  She does appear postictal but also has Ativan on board.  Receiving nebulizer treatments for what I suspected COPD exacerbation possible pneumonia based on x-ray.  Viral panel is negative.  Will closely monitor [PR]  1113 .  Patient will require admission to the hospital. [PR]  1116 CT head on my review and interpretation without evidence of IPH. [PR]  1153 On further review patient has been seen multiple times in the hospital for this similar to seizure activity.  Does have a history of pseudoseizures as well.  Has had similar presentation with negative EEGs with COPD exacerbations which I suspect is what is driving today's presentation.  I have ordered BiPAP to be placed she is not hypoxic but awaiting VBG and she is increasingly tachypneic with wheezing throughout. [PR]  1217 Respirations and O2 sats improved on BiPAP. [PR]    Clinical Course User Index [PR] Willy Eddy, MD     FINAL CLINICAL IMPRESSION(S) / ED DIAGNOSES   Final diagnoses:  COPD exacerbation (HCC)  Acute respiratory failure with hypoxia (HCC)     Rx / DC  Orders   ED Discharge Orders     None        Note:  This document was prepared using Dragon voice recognition software and may include unintentional dictation errors.    Willy Eddy, MD 09/12/23 1249

## 2023-09-12 NOTE — ED Notes (Signed)
 Patient began to display seizure like activity and become unresponsive to stimuli. MD notified.

## 2023-09-13 DIAGNOSIS — J441 Chronic obstructive pulmonary disease with (acute) exacerbation: Secondary | ICD-10-CM | POA: Diagnosis not present

## 2023-09-13 DIAGNOSIS — J9621 Acute and chronic respiratory failure with hypoxia: Secondary | ICD-10-CM | POA: Diagnosis not present

## 2023-09-13 DIAGNOSIS — G40919 Epilepsy, unspecified, intractable, without status epilepticus: Secondary | ICD-10-CM | POA: Diagnosis not present

## 2023-09-13 LAB — CBC
HCT: 37.8 % (ref 36.0–46.0)
Hemoglobin: 12.3 g/dL (ref 12.0–15.0)
MCH: 26.2 pg (ref 26.0–34.0)
MCHC: 32.5 g/dL (ref 30.0–36.0)
MCV: 80.6 fL (ref 80.0–100.0)
Platelets: 236 10*3/uL (ref 150–400)
RBC: 4.69 MIL/uL (ref 3.87–5.11)
RDW: 14 % (ref 11.5–15.5)
WBC: 6.3 10*3/uL (ref 4.0–10.5)
nRBC: 0 % (ref 0.0–0.2)

## 2023-09-13 LAB — URINALYSIS, COMPLETE (UACMP) WITH MICROSCOPIC
Bilirubin Urine: NEGATIVE
Glucose, UA: NEGATIVE mg/dL
Hgb urine dipstick: NEGATIVE
Ketones, ur: NEGATIVE mg/dL
Leukocytes,Ua: NEGATIVE
Nitrite: NEGATIVE
Protein, ur: NEGATIVE mg/dL
Specific Gravity, Urine: 1.025 (ref 1.005–1.030)
pH: 5 (ref 5.0–8.0)

## 2023-09-13 LAB — GLUCOSE, CAPILLARY
Glucose-Capillary: 185 mg/dL — ABNORMAL HIGH (ref 70–99)
Glucose-Capillary: 134 mg/dL — ABNORMAL HIGH (ref 70–99)
Glucose-Capillary: 242 mg/dL — ABNORMAL HIGH (ref 70–99)

## 2023-09-13 LAB — POTASSIUM: Potassium: 3.5 mmol/L (ref 3.5–5.1)

## 2023-09-13 LAB — BASIC METABOLIC PANEL
Anion gap: 11 (ref 5–15)
BUN: 14 mg/dL (ref 8–23)
CO2: 24 mmol/L (ref 22–32)
Calcium: 8.4 mg/dL — ABNORMAL LOW (ref 8.9–10.3)
Chloride: 106 mmol/L (ref 98–111)
Creatinine, Ser: 0.69 mg/dL (ref 0.44–1.00)
GFR, Estimated: >60 mL/min (ref >60)
Glucose, Bld: 198 mg/dL — ABNORMAL HIGH (ref 70–99)
Potassium: 3.8 mmol/L (ref 3.5–5.1)
Sodium: 141 mmol/L (ref 135–145)

## 2023-09-13 LAB — PHOSPHORUS: Phosphorus: 3.4 mg/dL (ref 2.5–4.6)

## 2023-09-13 LAB — MAGNESIUM: Magnesium: 2.5 mg/dL — ABNORMAL HIGH (ref 1.7–2.4)

## 2023-09-13 MED ORDER — CITALOPRAM HYDROBROMIDE 20 MG PO TABS
10.0000 mg | ORAL_TABLET | Freq: Every day | ORAL | Status: DC
  Administered 2023-09-13 – 2023-09-16 (×4): 10 mg via ORAL
  Filled 2023-09-13 (×4): qty 1

## 2023-09-13 MED ORDER — METOPROLOL TARTRATE 25 MG PO TABS
25.0000 mg | ORAL_TABLET | Freq: Once | ORAL | Status: AC
  Administered 2023-09-13: 25 mg via ORAL
  Filled 2023-09-13: qty 1

## 2023-09-13 MED ORDER — LACOSAMIDE 50 MG PO TABS
150.0000 mg | ORAL_TABLET | Freq: Two times a day (BID) | ORAL | Status: DC
  Administered 2023-09-13 – 2023-09-16 (×6): 150 mg via ORAL
  Filled 2023-09-13 (×6): qty 3

## 2023-09-13 MED ORDER — ACETAMINOPHEN 500 MG PO TABS
500.0000 mg | ORAL_TABLET | Freq: Four times a day (QID) | ORAL | Status: DC | PRN
  Administered 2023-09-13 – 2023-09-14 (×3): 500 mg via ORAL
  Filled 2023-09-13 (×3): qty 1

## 2023-09-13 MED ORDER — ROSUVASTATIN CALCIUM 10 MG PO TABS
10.0000 mg | ORAL_TABLET | Freq: Every day | ORAL | Status: DC
  Administered 2023-09-13 – 2023-09-15 (×3): 10 mg via ORAL
  Filled 2023-09-13 (×3): qty 1

## 2023-09-13 MED ORDER — CLOPIDOGREL BISULFATE 75 MG PO TABS
75.0000 mg | ORAL_TABLET | Freq: Every day | ORAL | Status: DC
  Administered 2023-09-13 – 2023-09-16 (×4): 75 mg via ORAL
  Filled 2023-09-13 (×4): qty 1

## 2023-09-13 MED ORDER — LACOSAMIDE 50 MG PO TABS
100.0000 mg | ORAL_TABLET | Freq: Two times a day (BID) | ORAL | Status: DC
  Administered 2023-09-13: 100 mg via ORAL
  Filled 2023-09-13: qty 2

## 2023-09-13 MED ORDER — GABAPENTIN 100 MG PO CAPS
200.0000 mg | ORAL_CAPSULE | Freq: Every day | ORAL | Status: DC
  Administered 2023-09-13 – 2023-09-15 (×3): 200 mg via ORAL
  Filled 2023-09-13 (×3): qty 2

## 2023-09-13 MED ORDER — MONTELUKAST SODIUM 10 MG PO TABS
10.0000 mg | ORAL_TABLET | Freq: Every day | ORAL | Status: DC
  Administered 2023-09-13 – 2023-09-15 (×3): 10 mg via ORAL
  Filled 2023-09-13 (×3): qty 1

## 2023-09-13 MED ORDER — ONDANSETRON HCL 4 MG/2ML IJ SOLN: 4.0000 mg | INTRAMUSCULAR | Status: DC | PRN

## 2023-09-13 MED ORDER — ISOSORBIDE MONONITRATE ER 30 MG PO TB24
30.0000 mg | ORAL_TABLET | Freq: Every day | ORAL | Status: DC
  Administered 2023-09-13 – 2023-09-16 (×4): 30 mg via ORAL
  Filled 2023-09-13 (×4): qty 1

## 2023-09-13 MED ORDER — IPRATROPIUM-ALBUTEROL 0.5-2.5 (3) MG/3ML IN SOLN
3.0000 mL | Freq: Two times a day (BID) | RESPIRATORY_TRACT | Status: DC
  Administered 2023-09-13 – 2023-09-16 (×6): 3 mL via RESPIRATORY_TRACT
  Filled 2023-09-13 (×6): qty 3

## 2023-09-13 MED ORDER — POTASSIUM CHLORIDE CRYS ER 20 MEQ PO TBCR
40.0000 meq | EXTENDED_RELEASE_TABLET | Freq: Once | ORAL | Status: AC
  Administered 2023-09-13: 40 meq via ORAL
  Filled 2023-09-13: qty 2

## 2023-09-13 MED ORDER — PANTOPRAZOLE SODIUM 40 MG PO TBEC
40.0000 mg | DELAYED_RELEASE_TABLET | Freq: Every day | ORAL | Status: DC
  Administered 2023-09-13 – 2023-09-16 (×4): 40 mg via ORAL
  Filled 2023-09-13 (×4): qty 1

## 2023-09-13 MED ORDER — METOPROLOL TARTRATE 5 MG/5ML IV SOLN
2.5000 mg | Freq: Once | INTRAVENOUS | Status: AC
  Administered 2023-09-13: 2.5 mg via INTRAVENOUS
  Filled 2023-09-13: qty 5

## 2023-09-13 MED ORDER — METOPROLOL SUCCINATE ER 25 MG PO TB24
25.0000 mg | ORAL_TABLET | Freq: Every day | ORAL | Status: DC
  Administered 2023-09-13 – 2023-09-16 (×3): 25 mg via ORAL
  Filled 2023-09-13 (×3): qty 1

## 2023-09-13 MED ORDER — INSULIN ASPART 100 UNIT/ML IJ SOLN
0.0000 [IU] | Freq: Three times a day (TID) | INTRAMUSCULAR | Status: DC
  Administered 2023-09-13: 2 [IU] via SUBCUTANEOUS
  Administered 2023-09-13 – 2023-09-14 (×3): 1 [IU] via SUBCUTANEOUS
  Administered 2023-09-14 – 2023-09-15 (×3): 2 [IU] via SUBCUTANEOUS
  Filled 2023-09-13 (×6): qty 1

## 2023-09-13 MED ORDER — MORPHINE SULFATE (PF) 2 MG/ML IV SOLN: 2.0000 mg | INTRAVENOUS | Status: DC | PRN

## 2023-09-13 MED ORDER — ROPINIROLE HCL 1 MG PO TABS
0.5000 mg | ORAL_TABLET | Freq: Every day | ORAL | Status: DC
  Administered 2023-09-13 – 2023-09-15 (×3): 0.5 mg via ORAL
  Filled 2023-09-13 (×2): qty 1
  Filled 2023-09-13: qty 2

## 2023-09-13 NOTE — Plan of Care (Signed)
  Problem: Education: Goal: Knowledge of General Education information will improve Description: Including pain rating scale, medication(s)/side effects and non-pharmacologic comfort measures Outcome: Progressing   Problem: Health Behavior/Discharge Planning: Goal: Ability to manage health-related needs will improve Outcome: Progressing   Problem: Clinical Measurements: Goal: Ability to maintain clinical measurements within normal limits will improve Outcome: Progressing Goal: Will remain free from infection Outcome: Progressing Goal: Diagnostic test results will improve Outcome: Progressing Goal: Respiratory complications will improve Outcome: Progressing Goal: Cardiovascular complication will be avoided Outcome: Progressing   Problem: Activity: Goal: Risk for activity intolerance will decrease Outcome: Progressing   Problem: Nutrition: Goal: Adequate nutrition will be maintained Outcome: Progressing   Problem: Coping: Goal: Level of anxiety will decrease Outcome: Progressing   Problem: Elimination: Goal: Will not experience complications related to bowel motility Outcome: Progressing Goal: Will not experience complications related to urinary retention Outcome: Progressing   Problem: Pain Managment: Goal: General experience of comfort will improve and/or be controlled Outcome: Progressing   Problem: Safety: Goal: Ability to remain free from injury will improve Outcome: Progressing   Problem: Skin Integrity: Goal: Risk for impaired skin integrity will decrease Outcome: Progressing   Problem: Education: Goal: Knowledge of disease or condition will improve Outcome: Progressing Goal: Knowledge of the prescribed therapeutic regimen will improve Outcome: Progressing   Problem: Activity: Goal: Ability to tolerate increased activity will improve Outcome: Progressing Goal: Will verbalize the importance of balancing activity with adequate rest periods Outcome:  Progressing   Problem: Respiratory: Goal: Ability to maintain a clear airway will improve Outcome: Progressing Goal: Levels of oxygenation will improve Outcome: Progressing Goal: Ability to maintain adequate ventilation will improve Outcome: Progressing   Problem: Coping: Goal: Ability to adjust to condition or change in health will improve Outcome: Progressing   Problem: Fluid Volume: Goal: Ability to maintain a balanced intake and output will improve Outcome: Progressing   Problem: Health Behavior/Discharge Planning: Goal: Ability to identify and utilize available resources and services will improve Outcome: Progressing Goal: Ability to manage health-related needs will improve Outcome: Progressing   Problem: Nutritional: Goal: Maintenance of adequate nutrition will improve Outcome: Progressing   Problem: Skin Integrity: Goal: Risk for impaired skin integrity will decrease Outcome: Progressing   Problem: Tissue Perfusion: Goal: Adequacy of tissue perfusion will improve Outcome: Progressing

## 2023-09-13 NOTE — Progress Notes (Signed)
 NEUROLOGY CONSULT FOLLOW UP NOTE   Date of service: September 13, 2023 Patient Name: [name] MRN:  [id number] DOB:  1951/05/02  Interval Hx/subjective   Recall for seizure When I went to the room, sitting comfortably eating breakfast-very participatory and jovial Vitals   Vitals:   09/13/23 0945 09/13/23 1000 09/13/23 1100 09/13/23 1145  BP: 114/65 104/62 (!) 91/57   Pulse: (!) 122 (!) 122 (!) 121   Resp: (!) 33 (!) 25 (!) 29   Temp:    98.5 F (36.9 C)  TempSrc:    Axillary  SpO2: 96% 93% 92%   Weight:      Height:         Body mass index is 26.87 kg/m.  Physical Exam  She is deaf and uses lipreading and sign language-assistance provided by family member at side General: Awake alert sitting comfortably in bed with supplemental oxygen in place HEENT: Normocephalic atraumatic Lungs: Clear Cardiovascular: Regular rate rhythm Neurological exam She is awake alert oriented x 3 Her speech is preserved-nondysarthric.  No evidence of aphasia. Cranial nerves II to XII: Pupils equal round react light, extraocular is intact, visual fields full, face appears grossly symmetric, auditory acuity-deaf bilaterally, tongue and palate midline.  Motor examination with no drift in the upper extremities and symmetric drift in bilateral lower extremities Sensation intact Coordination examination reveals no dysmetria   Medications  Current Facility-Administered Medications:    acetaminophen (TYLENOL) tablet 500 mg, 500 mg, Oral, Q6H PRN, Marrion Coy, MD, 500 mg at 09/13/23 0923   albuterol (PROVENTIL) (2.5 MG/3ML) 0.083% nebulizer solution 3 mL, 3 mL, Nebulization, Q2H PRN, Mumma, Shannon, MD, 3 mL at 09/12/23 1010   Chlorhexidine Gluconate Cloth 2 % PADS 6 each, 6 each, Topical, Daily, Marrion Coy, MD, 6 each at 09/13/23 0924   citalopram (CELEXA) tablet 10 mg, 10 mg, Oral, Daily, Marrion Coy, MD, 10 mg at 09/13/23 7829   clopidogrel (PLAVIX) tablet 75 mg, 75 mg, Oral, Daily, Marrion Coy, MD, 75 mg at 09/13/23 0923   enoxaparin (LOVENOX) injection 40 mg, 40 mg, Subcutaneous, Q24H, Marrion Coy, MD, 40 mg at 09/12/23 2116   gabapentin (NEURONTIN) capsule 200 mg, 200 mg, Oral, QHS, Marrion Coy, MD   insulin aspart (novoLOG) injection 0-9 Units, 0-9 Units, Subcutaneous, TID WC, Marrion Coy, MD, 2 Units at 09/13/23 1136   ipratropium-albuterol (DUONEB) 0.5-2.5 (3) MG/3ML nebulizer solution 3 mL, 3 mL, Nebulization, BID, Marrion Coy, MD   isosorbide mononitrate (IMDUR) 24 hr tablet 30 mg, 30 mg, Oral, Daily, Chipper Herb, Dekui, MD, 30 mg at 09/13/23 5621   lacosamide (VIMPAT) tablet 100 mg, 100 mg, Oral, BID, Marrion Coy, MD, 100 mg at 09/13/23 3086   metoprolol succinate (TOPROL-XL) 24 hr tablet 25 mg, 25 mg, Oral, Daily, Marrion Coy, MD, 25 mg at 09/13/23 0921   montelukast (SINGULAIR) tablet 10 mg, 10 mg, Oral, QHS, Marrion Coy, MD   Oral care mouth rinse, 15 mL, Mouth Rinse, 4 times per day, Marrion Coy, MD, 15 mL at 09/13/23 0800   Oral care mouth rinse, 15 mL, Mouth Rinse, PRN, Marrion Coy, MD   pantoprazole (PROTONIX) EC tablet 40 mg, 40 mg, Oral, Daily, Marrion Coy, MD, 40 mg at 09/13/23 5784   [COMPLETED] methylPREDNISolone sodium succinate (SOLU-MEDROL) 40 mg/mL injection 40 mg, 40 mg, Intravenous, Q12H, 40 mg at 09/13/23 0324 **FOLLOWED BY** predniSONE (DELTASONE) tablet 40 mg, 40 mg, Oral, Q breakfast, Marrion Coy, MD   rOPINIRole (REQUIP) tablet 0.5 mg, 0.5 mg, Oral,  Sandi Raveling, MD   rosuvastatin (CRESTOR) tablet 10 mg, 10 mg, Oral, Sandi Raveling, MD  Labs and Diagnostic Imaging   CBC:  Recent Labs  Lab 09/12/23 0909 09/13/23 0509  WBC 5.8 6.3  NEUTROABS 3.5  --   HGB 13.6 12.3  HCT 41.7 37.8  MCV 83.6 80.6  PLT 244 236    Basic Metabolic Panel:  Lab Results  Component Value Date   NA 141 09/13/2023   K 3.8 09/13/2023   CO2 24 09/13/2023   GLUCOSE 198 (H) 09/13/2023   BUN 14 09/13/2023   CREATININE 0.69 09/13/2023   CALCIUM 8.4 (L)  09/13/2023   GFRNONAA >60 09/13/2023   GFRAA >60 12/24/2019   Lipid Panel:  Lab Results  Component Value Date   LDLCALC 60 11/19/2022   HgbA1c:  Lab Results  Component Value Date   HGBA1C 7.1 (H) 03/19/2023   Urine Drug Screen:     Component Value Date/Time   LABOPIA NONE DETECTED 03/31/2022 0753   COCAINSCRNUR NONE DETECTED 03/31/2022 0753   LABBENZ POSITIVE (A) 03/31/2022 0753   AMPHETMU NONE DETECTED 03/31/2022 0753   THCU NONE DETECTED 03/31/2022 0753   LABBARB NONE DETECTED 03/31/2022 0753    Alcohol Level     Component Value Date/Time   ETH <10 09/29/2021 2136   INR  Lab Results  Component Value Date   INR 1.0 06/23/2023   APTT  Lab Results  Component Value Date   APTT 27 06/23/2023   AED levels:  Lab Results  Component Value Date   PHENYTOIN 12.6 05/16/2012    CT Head without contrast(Personally reviewed): No acute intracranial abnormality  Chest x-ray with new lingular atelectasis   ASSESSMENT    [name] is a 73 y.o. female with above past medical history, presenting for evaluation of shortness of breath and had seizure-like activity witnessed by the nursing staff at bedside that lasted about 10 minutes. She was back to what seems to be her normal baseline and was being treated for her COPD with BiPAP.  Had another episode of seizure this morning lasting about 10 minutes She has a history of seizures and pseudoseizures but both but the exam right now looks reassuring. Changes in AEDs have caused more sedation so family is reluctant to change-I agree with that.  She has had multiple episodes of having these seizure-like episodes with systemic illness  Impression: PNES versus breakthrough seizure in the setting of systemic illness   RECOMMENDATIONS  Verified with the son-Vimpat dosing is 150 twice daily.  Will change Ativan PRN only if seizure lasts more than 10 to 15 minutes minutes-please also call neurology at that time. Seizure  precautions Management of COPD and respiratory derangements per primary team. Check for any underlying evidence of infection, check urinalysis-treat UTI if present EEG in the morning  We will follow the EEG with you.   Signed, Milon Dikes, MD [facility]

## 2023-09-13 NOTE — Plan of Care (Signed)
 A&O patient, tolerating Bipap well this shift, denies pain. Patient is deaf in both ears but is able to communicate well thru writing. Daughter is at bedside to provide assistance also. HR has sustained 110-130's this shift. Patient did have a BM this shift. Potassium and Magnesium replaced this shift.  Problem: Education: Goal: Knowledge of General Education information will improve Description: Including pain rating scale, medication(s)/side effects and non-pharmacologic comfort measures Outcome: Progressing   Problem: Clinical Measurements: Goal: Ability to maintain clinical measurements within normal limits will improve Outcome: Progressing Goal: Will remain free from infection Outcome: Progressing Goal: Diagnostic test results will improve Outcome: Progressing Goal: Respiratory complications will improve Outcome: Progressing Goal: Cardiovascular complication will be avoided Outcome: Not Progressing   Problem: Activity: Goal: Risk for activity intolerance will decrease Outcome: Progressing   Problem: Nutrition: Goal: Adequate nutrition will be maintained Outcome: Not Progressing   Problem: Coping: Goal: Level of anxiety will decrease Outcome: Progressing   Problem: Elimination: Goal: Will not experience complications related to bowel motility Outcome: Progressing Goal: Will not experience complications related to urinary retention Outcome: Progressing   Problem: Pain Managment: Goal: General experience of comfort will improve and/or be controlled Outcome: Progressing   Problem: Safety: Goal: Ability to remain free from injury will improve Outcome: Progressing   Problem: Skin Integrity: Goal: Risk for impaired skin integrity will decrease Outcome: Progressing

## 2023-09-13 NOTE — Progress Notes (Signed)
 Progress Note   Patient: [name] ZOX:[id number] DOB: 04/14/1951 DOA: 09/12/2023     1 DOS: the patient was seen and examined on 09/13/2023   Brief hospital course: [name] is a 73 y.o. female with medical history significant of chronic diastolic congestive heart failure, moderate aortic stenosis, liver cirrhosis, COPD, chronic hypoxemia on 2 L oxygen, type 2 diabetes, essential hypertension, rheumatoid arthritis, obstructive sleep apnea and a history of stroke, seizure disorder, who presents to the hospital with 3 to 4 days of short of breath and cough.  Upon arriving the hospital, she was found seizing, she was given Ativan.  Postseizure, patient become unresponsive, worsening hypoxemia, was placed on BiPAP.    Principal Problem:   Acute on chronic respiratory failure with hypoxemia (HCC) Active Problems:   Breakthrough seizure (HCC)   COPD with acute exacerbation (HCC)   Chronic diastolic CHF (congestive heart failure) (HCC)   Hypokalemia   Aortic stenosis   Assessment and Plan:  COPD exacerbation. Patient recently had a worsening short of breath and a cough.  Consistent with COPD exacerbation.  Patient will be treated with steroids and scheduled bronchodilator.  Chest x-ray did not show any evidence of pneumonia, will hold off antibiotics. Patient condition appears to be improving today.  Continue oral steroids.   Acute on chronic respiratory failure with hypoxemia. Multifactorial, secondary to COPD exacerbation, possibly total from breakthrough seizure, sedation from Ativan.  Patient was chronically on 2 L oxygen. Patient was briefly put placed on BiPAP last night, was able to take off today. Oxygenation is doing better today.  Patient had another episode of seizure, did not receive Ativan, did not have worsening hypoxemia.  However, patient still finds oxygen which is not his baseline.  Continue to monitor closely.   Acute encephalopathy. Most likely postictal versus  sedation from Ativan.  Continue monitor closely. That has improved, started diet.   Breakthrough seizure. Spoke with the family, every time patient comes to the hospital with respiratory symptoms, she always has a seizure.  She has been seen by neurology, loaded with Keppra, also started on Vimpat. Patient had another episode of seizure this morning, discussed with Dr. Wilford Corner, patient had a history of seizures in the past, has been well-known to neurology, her seizure has not been correlated with EEG changes.  This most likely is not a true seizure, probably related to respiratory status.  Currently, patient does not need to change treatment for her seizure.   Chronic diastolic congestive heart failure. Moderate aortic stenosis. Patient has no evidence of volume overload this time.   Hypokalemia. Hypomagnesemia. Patient has improved.   Liver cirrhosis. Follow-up with PCP as outpatient.   Obstructive sleep apnea. Continue BiPAP for now.   History of stroke. Follow-up with family physician after discharge.   Due to continued requirement of BiPAP last night, seizure this morning, keep patient in stepdown unit for another day.     Subjective:  Patient woke up this morning, she was able to take off BiPAP, still has some short of breath with position. She had another episode of "seizure" roughly 9:45 AM this morning.  But did not have worsening desaturation.  Physical Exam: Vitals:   09/13/23 0945 09/13/23 1000 09/13/23 1100 09/13/23 1145  BP: 114/65 104/62 (!) 91/57   Pulse: (!) 122 (!) 122 (!) 121   Resp: (!) 33 (!) 25 (!) 29   Temp:    98.5 F (36.9 C)  TempSrc:    Axillary  SpO2:  96% 93% 92%   Weight:      Height:       General exam: Appears calm and comfortable  Respiratory system: Clear to auscultation. Respiratory effort normal. Cardiovascular system: S1 & S2 heard, RRR. No JVD, murmurs, rubs, gallops or clicks. No pedal edema. Gastrointestinal system: Abdomen is  nondistended, soft and nontender. No organomegaly or masses felt. Normal bowel sounds heard. Central nervous system: Alert and oriented x2.  Deaf. Extremities: Symmetric 5 x 5 power. Skin: No rashes, lesions or ulcers Psychiatry:  Mood & affect appropriate.    Data Reviewed:  Lab results reviewed.  Family Communication: Updated bedside, she also helps with interpretation.  Disposition: Status is: Inpatient Results reviewed.     Time spent: 55 minutes  Author: Marrion Coy, MD 09/13/2023 12:05 PM  For on call review [email or web address].

## 2023-09-13 NOTE — Hospital Course (Signed)
 [name] is a 73 y.o. female with medical history significant of chronic diastolic congestive heart failure, moderate aortic stenosis, liver cirrhosis, COPD, chronic hypoxemia on 2 L oxygen, type 2 diabetes, essential hypertension, rheumatoid arthritis, obstructive sleep apnea and a history of stroke, seizure disorder, who presents to the hospital with 3 to 4 days of short of breath and cough.  Upon arriving the hospital, she was found seizing, she was given Ativan.  Postseizure, patient become unresponsive, worsening hypoxemia, was placed on BiPAP.  Condition is improving, back on 2 L oxygen.  But still has increased mucus production, short of breath.  Added Mucomyst.

## 2023-09-13 NOTE — Progress Notes (Addendum)
 Dr. Chipper Herb notified patient noted to be seizing starting at [phone]. MD notified neurologist to assess patient. VSS.

## 2023-09-14 DIAGNOSIS — I471 Supraventricular tachycardia, unspecified: Secondary | ICD-10-CM | POA: Insufficient documentation

## 2023-09-14 DIAGNOSIS — J441 Chronic obstructive pulmonary disease with (acute) exacerbation: Secondary | ICD-10-CM | POA: Diagnosis not present

## 2023-09-14 DIAGNOSIS — J9621 Acute and chronic respiratory failure with hypoxia: Secondary | ICD-10-CM | POA: Diagnosis not present

## 2023-09-14 LAB — D-DIMER, QUANTITATIVE: D-Dimer, Quant: 0.44 ug{FEU}/mL (ref 0.00–0.50)

## 2023-09-14 LAB — GLUCOSE, CAPILLARY
Glucose-Capillary: 133 mg/dL — ABNORMAL HIGH (ref 70–99)
Glucose-Capillary: 131 mg/dL — ABNORMAL HIGH (ref 70–99)
Glucose-Capillary: 174 mg/dL — ABNORMAL HIGH (ref 70–99)
Glucose-Capillary: 219 mg/dL — ABNORMAL HIGH (ref 70–99)

## 2023-09-14 MED ORDER — SODIUM CHLORIDE 0.9 % IV BOLUS
250.0000 mL | Freq: Once | INTRAVENOUS | Status: AC
  Administered 2023-09-14: 250 mL via INTRAVENOUS

## 2023-09-14 MED ORDER — SODIUM CHLORIDE 0.9 % IV SOLN: INTRAVENOUS | Status: DC

## 2023-09-14 MED ORDER — SODIUM CHLORIDE 0.9 % IV SOLN: INTRAVENOUS | Status: DC | PRN

## 2023-09-14 NOTE — Plan of Care (Signed)

## 2023-09-14 NOTE — Progress Notes (Signed)
 Progress Note   Patient: [name] WUJ:[id number] DOB: 27-Mar-1951 DOA: 09/12/2023     2 DOS: the patient was seen and examined on 09/14/2023   Brief hospital course: KINSLEY HOLDERMAN is a 73 y.o. female with medical history significant of chronic diastolic congestive heart failure, moderate aortic stenosis, liver cirrhosis, COPD, chronic hypoxemia on 2 L oxygen, type 2 diabetes, essential hypertension, rheumatoid arthritis, obstructive sleep apnea and a history of stroke, seizure disorder, who presents to the hospital with 3 to 4 days of short of breath and cough.  Upon arriving the hospital, she was found seizing, she was given Ativan.  Postseizure, patient become unresponsive, worsening hypoxemia, was placed on BiPAP.     Principal Problem:   Acute on chronic respiratory failure with hypoxemia (HCC) Active Problems:   Breakthrough seizure (HCC)   COPD with acute exacerbation (HCC)   Chronic diastolic CHF (congestive heart failure) (HCC)   Hypokalemia   Aortic stenosis   Assessment and Plan: COPD exacerbation. Patient recently had a worsening short of breath and a cough.  Consistent with COPD exacerbation.  Patient will be treated with steroids and scheduled bronchodilator.  Chest x-ray did not show any evidence of pneumonia, will hold off antibiotics. Condition continue improved, but still required BiPAP last night.  Will obtain overnight oximetry to qualify for home use NIV.  Continue steroids.    Acute on chronic respiratory failure with hypoxemia. Multifactorial, secondary to COPD exacerbation, possibly total from breakthrough seizure, sedation from Ativan.  Patient was chronically on 2 L oxygen. Patient condition has improved, back on 2 L oxygen.  Continue to follow.  Patient can be transferred out of ICU today.   Acute encephalopathy. Most likely postictal versus sedation from Ativan.  Continue monitor closely. That has improved, started diet.   Breakthrough  seizure. Spoke with the family, every time patient comes to the hospital with respiratory symptoms, she always has a seizure.  She has been seen by neurology, loaded with Keppra, also started on Vimpat. Patient had another episode of seizure this morning, discussed with Dr. Wilford Corner, patient had a history of seizures in the past, has been well-known to neurology, her seizure has not been correlated with EEG changes.  This most likely is not a true seizure, probably related to respiratory status.  Currently, patient does not need to change treatment for her seizure. No additional episodes since yesterday.  Paroxysmal supraventricular tachycardia alternating with sinus bradycardia. Patient heart rate much better today, reviewed telemetry strips, compared to the 1 in sinus rhythm, tachycardia could have been PSVT, but the rate was relatively low in the range of sinus tachycardia.  However, with alternating rhythm on the same strip, it is apparent that tachycardia was not in sinus.  At this point, patient has controlled heart rate.  No additional workup is needed.  Continue metoprolol 25 mg daily.  Chronic diastolic congestive heart failure. Moderate aortic stenosis. Patient has no evidence of volume overload this time.   Hypokalemia. Hypomagnesemia. Patient has improved.   Liver cirrhosis. Follow-up with PCP as outpatient.   Obstructive sleep apnea. Continue BiPAP for now. Patient may need NIV at discharge, obtain overnight oximetry.   History of stroke. Follow-up with family physician after discharge.      Subjective:  Patient doing much better today, short of breath better.  Oxygenation back to baseline.  Physical Exam: Vitals:   09/14/23 1015 09/14/23 1030 09/14/23 1045 09/14/23 1100  BP: (!) 120/93 (!) 130/50 (!) 131/57 (!) 146/56  Pulse: (!) 48 (!) 50 (!) 48 (!) 51  Resp: (!) 25 (!) 21 (!) 22 (!) 27  Temp:      TempSrc:      SpO2: 96% 98% 99% 97%  Weight:      Height:        General exam: Appears calm and comfortable  Respiratory system: Decreased breath sounds. Respiratory effort normal. Cardiovascular system: S1 & S2 heard, RRR. No JVD, murmurs, rubs, gallops or clicks. No pedal edema. Gastrointestinal system: Abdomen is nondistended, soft and nontender. No organomegaly or masses felt. Normal bowel sounds heard. Central nervous system: Alert and oriented. No focal neurological deficits. Extremities: Symmetric 5 x 5 power. Skin: No rashes, lesions or ulcers Psychiatry: Judgement and insight appear normal. Mood & affect appropriate.    Data Reviewed:  Results reviewed.  Family Communication: Daughter updated at bedside. Called son per request, not able to go through  Disposition: Status is: Inpatient Remains inpatient appropriate because: Severity of disease,     Time spent: 50 minutes  Author: Marrion Coy, MD 09/14/2023 12:16 PM  For on call review [email or web address].

## 2023-09-15 DIAGNOSIS — G40919 Epilepsy, unspecified, intractable, without status epilepticus: Secondary | ICD-10-CM | POA: Diagnosis not present

## 2023-09-15 DIAGNOSIS — J441 Chronic obstructive pulmonary disease with (acute) exacerbation: Secondary | ICD-10-CM | POA: Diagnosis not present

## 2023-09-15 DIAGNOSIS — J9621 Acute and chronic respiratory failure with hypoxia: Secondary | ICD-10-CM | POA: Diagnosis not present

## 2023-09-15 LAB — GLUCOSE, CAPILLARY
Glucose-Capillary: 117 mg/dL — ABNORMAL HIGH (ref 70–99)
Glucose-Capillary: 175 mg/dL — ABNORMAL HIGH (ref 70–99)
Glucose-Capillary: 186 mg/dL — ABNORMAL HIGH (ref 70–99)
Glucose-Capillary: 217 mg/dL — ABNORMAL HIGH (ref 70–99)

## 2023-09-15 MED ORDER — ACETYLCYSTEINE 20 % IN SOLN
3.0000 mL | Freq: Three times a day (TID) | RESPIRATORY_TRACT | Status: DC
  Administered 2023-09-16: 3 mL via RESPIRATORY_TRACT
  Filled 2023-09-15 (×5): qty 4

## 2023-09-15 NOTE — Plan of Care (Signed)
 Patient's clinical events have resolved and in this setting in discussion with Dr. Chipper Herb we will hold off on repeat EEG at this time Inpatient neurology will sign off at this time but please do reach out with any additional questions or concerns   No charge plan of care note Brooke Dare MD-PhD [facility] [phone] Available 7 AM to 7 PM, outside these hours please contact Neurologist on call listed on AMION

## 2023-09-15 NOTE — Progress Notes (Signed)
 Transition of Care [facility]) - Inpatient Brief Assessment   Patient Details  Name: [name] MRN: [id number] Date of Birth: 26-Jun-1951  Transition of [facility]) CM/SW Contact:    Truddie Hidden, RN Phone Number: 09/15/2023, 9:47 AM   Clinical Narrative: TOC continuing to follow patient's progress throughout discharge planning.   Transition of Care Asessment: Insurance and Status: Insurance coverage has been reviewed Patient has primary care physician: Yes   Prior level of function:: Independent Prior/Current Home Services: No current home services Social Drivers of Health Review: SDOH reviewed no interventions necessary Readmission risk has been reviewed: Yes Transition of care needs: no transition of care needs at this time

## 2023-09-15 NOTE — Progress Notes (Signed)
 Progress Note   Patient: [name] DOB: 01/23/1951 DOA: 09/12/2023     3 DOS: the patient was seen and examined on 09/15/2023   Brief hospital course: [name] is a 73 y.o. female with medical history significant of chronic diastolic congestive heart failure, moderate aortic stenosis, liver cirrhosis, COPD, chronic hypoxemia on 2 L oxygen, type 2 diabetes, essential hypertension, rheumatoid arthritis, obstructive sleep apnea and a history of stroke, seizure disorder, who presents to the hospital with 3 to 4 days of short of breath and cough.  Upon arriving the hospital, she was found seizing, she was given Ativan.  Postseizure, patient become unresponsive, worsening hypoxemia, was placed on BiPAP.  Condition is improving, back on 2 L oxygen.  But still has increased mucus production, short of breath.  Added Mucomyst.    Principal Problem:   Acute on chronic respiratory failure with hypoxemia (HCC) Active Problems:   Breakthrough seizure (HCC)   COPD with acute exacerbation (HCC)   Chronic diastolic CHF (congestive heart failure) (HCC)   Hypokalemia   Aortic stenosis   SVT (supraventricular tachycardia) (HCC)   Assessment and Plan:  COPD exacerbation. Patient recently had a worsening short of breath and a cough.  Consistent with COPD exacerbation.  Patient will be treated with steroids and scheduled bronchodilator.  Chest x-ray did not show any evidence of pneumonia, will hold off antibiotics. 3/10. Condition continue improved, but still required BiPAP last night.  Will obtain overnight oximetry to qualify for home use NIV.  Continue steroids. 3/11.  Did not require BiPAP last night, but has significant amount of mucus production, could not clear.  Added Mucomyst.     Acute on chronic respiratory failure with hypoxemia. Multifactorial, secondary to COPD exacerbation, possibly total from breakthrough seizure, sedation from Ativan.  Patient was chronically on 2 L  oxygen. 3/10. Patient condition has improved, back on 2 L oxygen.  Continue to follow.  Patient can be transferred out of ICU today. Condition has improved to baseline.   Acute encephalopathy. Most likely postictal versus sedation from Ativan.  Continue monitor closely. That has improved, started diet.   Breakthrough seizure. Spoke with the family, every time patient comes to the hospital with respiratory symptoms, she always has a seizure.  She has been seen by neurology, loaded with Keppra, also started on Vimpat. Patient had another episode of seizure this morning, discussed with Dr. Wilford Corner, patient had a history of seizures in the past, has been well-known to neurology, her seizure has not been correlated with EEG changes.  This most likely is not a true seizure, probably related to respiratory status.  Currently, patient does not need to change treatment for her seizure. No additional episodes since yesterday.   Paroxysmal supraventricular tachycardia alternating with sinus bradycardia. Patient heart rate much better today, reviewed telemetry strips, compared to the 1 in sinus rhythm, tachycardia could have been PSVT, but the rate was relatively low in the range of sinus tachycardia.  However, with alternating rhythm on the same strip, it is apparent that tachycardia was not in sinus.  At this point, patient has controlled heart rate.  No additional workup is needed.  Continue metoprolol 25 mg daily. Patient still has intermittent tachycardia alternating with bradycardia.  Will be seen by cardiology to see if any additional treatment is needed.   Chronic diastolic congestive heart failure. Moderate aortic stenosis. Patient has no evidence of volume overload this time.   Hypokalemia. Hypomagnesemia. Patient has improved.  Liver cirrhosis. Follow-up with PCP as outpatient.   Obstructive sleep apnea. Patient did not require BiPAP last night.  Will continue to follow.   History of  stroke. Follow-up with family physician after discharge.      Subjective:  Patient feels much better today, still have significant cough, not able to cough up mucus.  Short of breath with exertion.  Physical Exam: Vitals:   09/14/23 2355 09/15/23 0403 09/15/23 0735 09/15/23 0825  BP: (!) 123/50 (!) 141/57 (!) 140/53 (!) 140/53  Pulse: (!) 51 (!) 53 61 61  Resp: 20 20 20    Temp: 97.9 F (36.6 C) 97.7 F (36.5 C) 98.4 F (36.9 C)   TempSrc:      SpO2: 95% 97% 94%   Weight:      Height:       General exam: Appears calm and comfortable  Respiratory system: Decreased breath sounds. Respiratory effort normal. Cardiovascular system: S1 & S2 heard, RRR. No JVD, murmurs, rubs, gallops or clicks. No pedal edema. Gastrointestinal system: Abdomen is nondistended, soft and nontender. No organomegaly or masses felt. Normal bowel sounds heard. Central nervous system: Alert and oriented x3. No focal neurological deficits. Extremities: Symmetric 5 x 5 power. Skin: No rashes, lesions or ulcers Psychiatry: Judgement and insight appear normal. Mood & affect appropriate.    Data Reviewed:  Reviewed the telemetry strips, lab results.  Family Communication: None  Disposition: Status is: Inpatient Remains inpatient appropriate because: Severity of disease. Obtain PT to see if patient need nursing home placement.     Time spent: 35 minutes  Author: Marrion Coy, MD 09/15/2023 12:37 PM  For on call review [email or web address].

## 2023-09-15 NOTE — Plan of Care (Signed)

## 2023-09-15 NOTE — Care Management Important Message (Signed)
 Important Message  Patient Details  Name: [name] MRN: [id number] Date of Birth: 04-02-1951   Important Message Given:  Yes - Medicare IM     Cristela Blue, CMA 09/15/2023, 10:40 AM

## 2023-09-15 NOTE — Plan of Care (Signed)
  Problem: Clinical Measurements: Goal: Ability to maintain clinical measurements within normal limits will improve Outcome: Progressing   Problem: Clinical Measurements: Goal: Diagnostic test results will improve Outcome: Progressing   Problem: Clinical Measurements: Goal: Respiratory complications will improve Outcome: Progressing   Problem: Clinical Measurements: Goal: Cardiovascular complication will be avoided Outcome: Progressing

## 2023-09-15 NOTE — Consult Note (Signed)
 [facility] CLINIC CARDIOLOGY CONSULT NOTE       Patient ID: [name] MRN: [id number] DOB/AGE: 72-22-1952 72 y.o.  Admit date: 09/12/2023 Referring Physician Dr Chipper Herb Primary Physician [name], NP Primary Cardiologist Alluri Reason for Consultation possible Afib  HPI: [name] is a 73 y.o. female with medical history significant of chronic diastolic congestive heart failure, moderate aortic stenosis, liver cirrhosis, COPD, chronic hypoxemia on 2 L oxygen, type 2 diabetes, essential hypertension, rheumatoid arthritis, obstructive sleep apnea and a history of stroke, seizure disorder, who presents to the hospital with 3 to 4 days of short of breath and cough.  Upon arriving the hospital, she was found seizing, she was given Ativan.  Postseizure, patient become unresponsive, worsening hypoxemia, was placed on BiPAP.   Cardiology was placed on consultation for possible Afib. After reviewing her tele strips, patient appears to have been in NSR with some intermittent episodes of SVT which is quite common on COPD patients. She is very short of breath still. No complaints of chest pain or palpitations.  Review of systems complete and found to be negative unless listed above     Past Medical History:  Diagnosis Date   Asthma    CHF (congestive heart failure) (HCC)    Cirrhosis, non-alcoholic (HCC)    COPD (chronic obstructive pulmonary disease) (HCC)    Deaf    Depression    Diabetes mellitus without complication (HCC)    GERD (gastroesophageal reflux disease)    Heart murmur    Hepatitis    History of rheumatic fever    History of scarlet fever    Hypertension    IBS (irritable bowel syndrome)    Lymph node disorder    arm   Neuropathy    On home oxygen therapy    hs   Orthopnea    Osteoarthritis    RA (rheumatoid arthritis) (HCC)    RLS (restless legs syndrome)    Seizures (HCC)    Shortness of breath dyspnea    Sleep apnea    Stroke (HCC)    tia    Past  Surgical History:  Procedure Laterality Date   ABDOMINAL HYSTERECTOMY     BREAST BIOPSY Right 10/30/2020   Stereo Bx, X-clip,  BENIGN BREAST TISSUE WITH FIBROADENOMATOUS   CATARACT EXTRACTION W/PHACO Right 11/23/2014   Procedure: CATARACT EXTRACTION PHACO AND INTRAOCULAR LENS PLACEMENT (IOC);  Surgeon: Lia Hopping, MD;  Location: [facility] OR[facility];  Service: Ophthalmology;  Laterality: Right;   CATARACT EXTRACTION W/PHACO Left 12/14/2014   Procedure: CATARACT EXTRACTION PHACO AND INTRAOCULAR LENS PLACEMENT (IOC);  Surgeon: Lia Hopping, MD;  Location: [facility];  Service: Ophthalmology;  Laterality: Left;  US:01:16.6 AP:15.8 CDE:12.14   CESAREAN SECTION     CHOLECYSTECTOMY     COLONOSCOPY WITH PROPOFOL N/A 10/08/2020   Procedure: COLONOSCOPY WITH PROPOFOL;  Surgeon: Toledo, Boykin Nearing, MD;  Location: [facility];  Service: Gastroenterology;  Laterality: N/A;   ESOPHAGOGASTRODUODENOSCOPY (EGD) WITH PROPOFOL N/A 10/08/2020   Procedure: ESOPHAGOGASTRODUODENOSCOPY (EGD) WITH PROPOFOL;  Surgeon: Toledo, Boykin Nearing, MD;  Location: [facility];  Service: Gastroenterology;  Laterality: N/A;  DM DEAF, NEEDS SIGN INTERPRETER PER SON   REVERSE SHOULDER ARTHROPLASTY Right 12/21/2019   Procedure: REVERSE SHOULDER ARTHROPLASTY;  Surgeon: Lyndle Herrlich, MD;  Location: [facility];  Service: Orthopedics;  Laterality: Right;   THUMB ARTHROSCOPY     TONSILLECTOMY     TYMPANOPLASTY     muliple    Medications Prior to Admission  Medication Sig Dispense  Refill Last Dose/Taking   acyclovir ointment (ZOVIRAX) 5 % Apply 1 Application topically 5 (five) times daily. X 4 days. Start at onset of symptoms. 15 g 5 Past Month   citalopram (CELEXA) 10 MG tablet TAKE 1 TABLET BY MOUTH DAILY 90 tablet 3 09/11/2023 Morning   clopidogrel (PLAVIX) 75 MG tablet TAKE 1 TABLET BY MOUTH DAILY 90 tablet 1 09/11/2023 Morning   fluticasone (FLONASE) 50 MCG/ACT nasal spray Place 1 spray into both nostrils 2 (two) times daily.   Past  Week   furosemide (LASIX) 40 MG tablet Take 1 tablet (40 mg total) by mouth daily. 90 tablet 3 09/11/2023 Morning   gabapentin (NEURONTIN) 100 MG capsule Take 2 capsules (200 mg total) by mouth at bedtime. 180 capsule 3 09/11/2023 Bedtime   glimepiride (AMARYL) 2 MG tablet TAKE 1 TABLET BY MOUTH DAILY FOR DIABETES (Patient taking differently: Take 2 mg by mouth daily with breakfast.) 90 tablet 3 09/11/2023 Morning   ipratropium (ATROVENT) 0.02 % nebulizer solution Take 2.5 mLs (0.5 mg total) by nebulization 4 (four) times daily. 75 mL 12 Past Month   ipratropium-albuterol (DUONEB) 0.5-2.5 (3) MG/3ML SOLN INHALE 1 VIAL USING NEBULIZER EVERY SIX HOURS AS NEEDED FOR SHORTNESS OF BREATH 90 mL 1 Past Month   isosorbide mononitrate (IMDUR) 30 MG 24 hr tablet Take 30 mg by mouth daily.   09/11/2023 Morning   Lacosamide (VIMPAT) 150 MG TABS Take 150 mg by mouth every 12 (twelve) hours.   09/11/2023 Evening   losartan (COZAAR) 100 MG tablet TAKE 1 TABLET BY MOUTH DAILY AS DIRECTED 90 tablet 3 09/11/2023 Morning   metoprolol succinate (TOPROL-XL) 25 MG 24 hr tablet TAKE 1 TABLET BY MOUTH DAILY 90 tablet 3 09/11/2023 Morning   montelukast (SINGULAIR) 10 MG tablet TAKE 1 TABLET BY MOUTH AT BEDTIME 90 tablet 3 09/11/2023 Bedtime   pantoprazole (PROTONIX) 40 MG tablet TAKE 1 TABLET BY MOUTH DAILY FOR REFLUX/HEARTBURN 90 tablet 3 09/11/2023 Evening   potassium chloride (KLOR-CON) 10 MEQ tablet TAKE 2 TABLETS BY MOUTH DAILY 180 tablet 3 09/11/2023 Morning   rOPINIRole (REQUIP) 0.5 MG tablet Take 1 tablet (0.5 mg total) by mouth at bedtime. 30 tablet 0 09/11/2023 Bedtime   rosuvastatin (CRESTOR) 10 MG tablet TAKE 1 TABLET BY MOUTH AT BEDTIME 90 tablet 3 09/11/2023 Bedtime   Social History   Socioeconomic History   Marital status: Widowed    Spouse name: Not on file   Number of children: Not on file   Years of education: Not on file   Highest education level: Not on file  Occupational History   Not on file  Tobacco Use   Smoking  status: Every Day    Current packs/day: 0.50    Average packs/day: 0.5 packs/day for 55.5 years (27.8 ttl pk-yrs)    Types: Cigarettes    Start date: 03/01/1968   Smokeless tobacco: Never  Vaping Use   Vaping status: Former  Substance and Sexual Activity   Alcohol use: No   Drug use: No   Sexual activity: Not Currently  Other Topics Concern   Not on file  Social History Narrative   Pt lives in 1 story home with her son, his girlfriend and a family friend   Has 2 adult children   9th grade education   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    Social Drivers of Health   Financial Resource Strain: Low Risk  (09/03/2023)   [facility]   Overall Financial Resource Strain (CARDIA)    Difficulty of Paying Living Expenses: Not hard at all  Food Insecurity: No Food Insecurity (09/03/2023)   [facility] from [facility]   Hunger Vital Sign    Worried About Running Out of Food in the Last Year: Never true    Ran Out of Food in the Last Year: Never true  Transportation Needs: No Transportation Needs (09/14/2023)   PRAPARE - Administrator, Civil Service (Medical): No    Lack of Transportation (Non-Medical): No  Physical Activity: Not on file  Stress: Not on file  Social Connections: Moderately Integrated (09/14/2023)   Social Connection and Isolation Panel [NHANES]    Frequency of Communication with Friends and Family: Three times a week    Frequency of Social Gatherings with Friends and Family: Three times a week    Attends Religious Services: More than 4 times per year    Active Member of Clubs or Organizations: Yes    Attends Banker Meetings: More than 4 times per year    Marital Status: Widowed  Intimate Partner Violence: Not At Risk (09/14/2023)   Humiliation, Afraid, Rape, and Kick questionnaire    Fear of Current or Ex-Partner: No    Emotionally Abused: No    Physically Abused: No     Sexually Abused: No    Family History  Problem Relation Age of Onset   Lung cancer Mother    CAD Father    Miscarriages / Stillbirths Sister    Hyperlipidemia Sister    Hypertension Sister    Diabetes Sister    COPD Sister    Asthma Sister    Arthritis Sister    Breast cancer Sister    Heart attack Sister    Hyperlipidemia Sister    Heart disease Sister    Heart attack Sister    Diabetes Sister    Cancer Sister    Asthma Sister    Heart disease Brother    Heart attack Brother    Depression Brother    Hypertension Son    Hyperlipidemia Son    Heart disease Son    Depression Son    Hypertension Daughter    Depression Daughter    Asthma Daughter    Birth defects Paternal Uncle    Hearing loss Brother      Vitals:   09/14/23 2355 09/15/23 0403 09/15/23 0735 09/15/23 0825  BP: (!) 123/50 (!) 141/57 (!) 140/53 (!) 140/53  Pulse: (!) 51 (!) 53 61 61  Resp: 20 20 20    Temp: 97.9 F (36.6 C) 97.7 F (36.5 C) 98.4 F (36.9 C)   TempSrc:      SpO2: 95% 97% 94%   Weight:      Height:        PHYSICAL EXAM General: awaje, well nourished, in no acute distress. HEENT: Normocephalic and atraumatic. Neck: No JVD.  Lungs: coarse lung sounds bilaterally Heart: HRRR. Normal S1 and S2 without gallops or murmurs.  Abdomen: Non-distended appearing.  Msk: Normal strength and tone for age. Extremities: Warm and well perfused. No clubbing, cyanosis. no edema.  Neuro: Alert and oriented X 3. Psych: Answers questions appropriately.   Labs: Basic Metabolic Panel: Recent Labs    09/12/23 1912 09/13/23 0010 09/13/23 0509  NA  --   --  141  K  --  3.5 3.8  CL  --   --  106  CO2  --   --  24  GLUCOSE  --   --  198*  BUN  --   --  14  CREATININE  --   --  0.69  CALCIUM  --   --  8.4*  MG 1.3*  --  2.5*  PHOS  --   --  3.4   Liver Function Tests: No results for input(s): "AST", "ALT", "ALKPHOS", "BILITOT", "PROT", "ALBUMIN" in the last 72 hours. No results for  input(s): "LIPASE", "AMYLASE" in the last 72 hours. CBC: Recent Labs    09/13/23 0509  WBC 6.3  HGB 12.3  HCT 37.8  MCV 80.6  PLT 236   Cardiac Enzymes: Recent Labs    09/12/23 1426  CKTOTAL 69   BNP: No results for input(s): "BNP" in the last 72 hours. D-Dimer: Recent Labs    09/12/23 1426 09/13/23 2358  DDIMER 0.56* 0.44   Hemoglobin A1C: No results for input(s): "HGBA1C" in the last 72 hours. Fasting Lipid Panel: No results for input(s): "CHOL", "HDL", "LDLCALC", "TRIG", "CHOLHDL", "LDLDIRECT" in the last 72 hours. Thyroid Function Tests: No results for input(s): "TSH", "T4TOTAL", "T3FREE", "THYROIDAB" in the last 72 hours.  Invalid input(s): "FREET3" Anemia Panel: No results for input(s): "VITAMINB12", "FOLATE", "FERRITIN", "TIBC", "IRON", "RETICCTPCT" in the last 72 hours.   Radiology: CT Head Wo Contrast Result Date: 09/12/2023 CLINICAL DATA:  73 year old female with new onset seizure. EXAM: CT HEAD WITHOUT CONTRAST TECHNIQUE: Contiguous axial images were obtained from the base of the skull through the vertex without intravenous contrast. RADIATION DOSE REDUCTION: This exam was performed according to the departmental dose-optimization program which includes automated exposure control, adjustment of the mA and/or kV according to patient size and/or use of iterative reconstruction technique. COMPARISON:  Brain MRI 03/14/2023.  Head CT 02/24/2023. FINDINGS: Brain: Cerebral volume remains normal for age. No midline shift, ventriculomegaly, mass effect, evidence of mass lesion, intracranial hemorrhage or evidence of cortically based acute infarction. Stable gray-white differentiation, mild to moderate for age chronic white matter heterogeneity. Vascular: No suspicious intracranial vascular hyperdensity. Calcified atherosclerosis at the skull base. Skull: Stable.  No acute osseous abnormality identified. Sinuses/Orbits: Chronic left mastoidectomy. Visible paranasal sinuses are  stable and well aerated. Other: No acute orbit or scalp soft tissue finding. IMPRESSION: No acute intracranial abnormality. Stable non contrast CT appearance of the chronic white matter disease. Electronically Signed   By: Odessa Fleming M.D.   On: 09/12/2023 11:16   DG Chest 2 View Result Date: 09/12/2023 CLINICAL DATA:  Shortness of breath.  Hypoxia. EXAM: CHEST - 2 VIEW COMPARISON:  07/05/2023 FINDINGS: The heart size and mediastinal contours are within normal limits. New atelectasis is seen in the lingula. No pleural effusion. Right shoulder prosthesis again noted. IMPRESSION: New lingular atelectasis. Electronically Signed   By: Danae Orleans M.D.   On: 09/12/2023 09:26    ECHO pending  TELEMETRY reviewed by me [facility]) 09/15/2023 : NSR, few episodes of SVT   Data reviewed by me [facility]) 09/15/2023: last 24h vitals tele labs imaging I/O provider notes  Principal Problem:   Acute on chronic respiratory failure with hypoxemia (HCC) Active Problems:   Hypokalemia   Breakthrough seizure (HCC)   Chronic diastolic CHF (congestive heart failure) (HCC)   COPD with acute exacerbation (HCC)   Aortic stenosis   SVT (supraventricular tachycardia) (HCC)    ASSESSMENT AND PLAN:  Intermittent SVT Mild to moderate aortic stenosis Chronic heart failure with preserved EF Hypertension, hyperlipidemia, diabetes COPD/severe emphysema Prior stroke, epilepsy history, carotid artery disease Tobacco  abuse with 60-pack-year smoking history  Poor follow-up with [place] in office. Nuclear stress test and echocardiogram had been ordered but patient never got the studies. We will obtain echocardiogram. Can use IV lopressor PRN for SVT. Increase home dose Toprol if needed for tachycardia. Cardiology will sign off, please reach out with any questions or concerns.    Signed: Clotilde Dieter, DO 09/15/2023, 10:45 AM [facility]

## 2023-09-16 ENCOUNTER — Inpatient Hospital Stay: Admit: 2023-09-16 | Discharge: 2023-09-16 | Disposition: A | Discharge status: Home / Self Care | Attending: Student

## 2023-09-16 ENCOUNTER — Inpatient Hospital Stay: Admit: 2023-09-16

## 2023-09-16 DIAGNOSIS — J9621 Acute and chronic respiratory failure with hypoxia: Secondary | ICD-10-CM | POA: Diagnosis not present

## 2023-09-16 LAB — ECHOCARDIOGRAM COMPLETE
AR max vel: 1.59 cm2
AV Area VTI: 1.57 cm2
AV Area mean vel: 1.58 cm2
AV Mean grad: 14 mmHg
AV Peak grad: 26.8 mmHg
Ao pk vel: 2.59 m/s
Area-P 1/2: 4.17 cm2
Calc EF: 55.4 %
Height: 63 in
MV VTI: 2.08 cm2
S' Lateral: 2.6 cm
Single Plane A2C EF: 47.4 %
Single Plane A4C EF: 66.5 %
Weight: 2426.82 [oz_av]

## 2023-09-16 LAB — GLUCOSE, CAPILLARY
Glucose-Capillary: 89 mg/dL (ref 70–99)
Glucose-Capillary: 125 mg/dL — ABNORMAL HIGH (ref 70–99)

## 2023-09-16 MED ORDER — ACETYLCYSTEINE 20 % IN SOLN: 3.0000 mL | Freq: Two times a day (BID) | RESPIRATORY_TRACT | Status: DC

## 2023-09-16 NOTE — Plan of Care (Signed)
  Problem: Clinical Measurements: Goal: Respiratory complications will improve Outcome: Progressing   Problem: Clinical Measurements: Goal: Cardiovascular complication will be avoided Outcome: Progressing   Problem: Activity: Goal: Risk for activity intolerance will decrease Outcome: Progressing   Problem: Nutrition: Goal: Adequate nutrition will be maintained Outcome: Progressing   

## 2023-09-16 NOTE — Discharge Summary (Addendum)
 Physician Discharge Summary   Patient: [name] MRN: [id number] DOB: Nov 21, 1950  Admit date:     09/12/2023  Discharge date: 09/16/23  Discharge Physician: Lurene Shadow   PCP: [name], NP   Recommendations at discharge:   Follow-up with PCP in 1 week  Discharge Diagnoses: Principal Problem:   Acute on chronic respiratory failure with hypoxemia (HCC) Active Problems:   Breakthrough seizure (HCC)   COPD with acute exacerbation (HCC)   Chronic diastolic CHF (congestive heart failure) (HCC)   Hypokalemia   Aortic stenosis   SVT (supraventricular tachycardia) (HCC)  Resolved Problems:   * No resolved hospital problems. *  Hospital Course:  [name] is a 73 y.o. female with medical history significant for chronic diastolic congestive heart failure, moderate aortic stenosis, liver cirrhosis, COPD, chronic hypoxemia on 2 L oxygen, type 2 diabetes, essential hypertension, rheumatoid arthritis, obstructive sleep apnea and a history of stroke, seizure disorder, who presented to the hospital with 3 to 4-day history of cough and shortness of breath.  She was noted to be having seizure-like activity in the ED.  She was given Ativan for seizures. She became unresponsive after seizure and developed worsening acute hypoxic respiratory failure.  She was placed on BiPAP.  She was also treated for COPD exacerbation with steroids and bronchodilators.    Assessment and Plan:   COPD exacerbation: Improved.  Completed course of steroids.  Continue bronchodilators as needed.   Acute on chronic hypoxic respiratory failure: She is on 2 L/min oxygen via nasal cannula which is her baseline.  She has been weaned down from 5 L oxygen. Previously required BiPAP   Acute metabolic encephalopathy: Contrast was has improved to baseline.  Probably from postictal state versus use of Ativan.   Breakthrough seizures: According to family, patient always has a seizure anytime she is admitted  to the hospital for respiratory issues. She was evaluated by the neurologist.  She is well-known to the neurology service. Continue lacosamide. She was previously loaded with IV Keppra.   Hypokalemia, hypomagnesemia: Improved   Chronic diastolic CHF, history of moderate aortic stenosis: Compensated. 2D echo showed EF estimated at 60 to 65%, grade 2 diastolic dysfunction, mild MR, no aortic stenosis present on recent 2D echo.   Comorbidities include liver cirrhosis, obstructive sleep apnea, history of stroke   Her condition has improved and she is deemed stable for discharge to home today.  Discharge plan was discussed with [name], son at the bedside.  We went through her home medicines together.  Patient was able to read my lips during this encounter.  She understood the plan of care and everything that was discussed.     Consultants: Cardiologist Procedures performed: None Disposition: Home Diet recommendation:  Discharge Diet Orders (From admission, onward)     Start     Ordered   09/16/23 0000  Diet - low sodium heart healthy        09/16/23 1050           Cardiac diet DISCHARGE MEDICATION: Allergies as of 09/16/2023       Reactions   Celebrex [celecoxib] Itching   itching   Ciprofloxacin Itching   Codeine Itching   Fosphenytoin Itching, Other (See Comments)   Levaquin [levofloxacin In D5w] Itching   Levofloxacin Itching   Lovastatin Itching   Pravastatin Itching   Sulfa Antibiotics Itching   Aspirin Itching, Rash   Azithromycin Rash   Penicillins Rash   Documentation indicates severe reaction Pt tolerated  cephalosporin without adverse reaction 09/18        Medication List     STOP taking these medications    ipratropium-albuterol 0.5-2.5 (3) MG/3ML Soln Commonly known as: DUONEB       TAKE these medications    acyclovir ointment 5 % Commonly known as: Zovirax Apply 1 Application topically 5 (five) times daily. X 4 days. Start at onset of  symptoms.   citalopram 10 MG tablet Commonly known as: CELEXA TAKE 1 TABLET BY MOUTH DAILY   clopidogrel 75 MG tablet Commonly known as: PLAVIX TAKE 1 TABLET BY MOUTH DAILY   fluticasone 50 MCG/ACT nasal spray Commonly known as: FLONASE Place 1 spray into both nostrils 2 (two) times daily.   furosemide 40 MG tablet Commonly known as: LASIX Take 1 tablet (40 mg total) by mouth daily.   gabapentin 100 MG capsule Commonly known as: NEURONTIN Take 2 capsules (200 mg total) by mouth at bedtime.   glimepiride 2 MG tablet Commonly known as: AMARYL TAKE 1 TABLET BY MOUTH DAILY FOR DIABETES What changed: See the new instructions.   ipratropium 0.02 % nebulizer solution Commonly known as: ATROVENT Take 2.5 mLs (0.5 mg total) by nebulization 4 (four) times daily.   isosorbide mononitrate 30 MG 24 hr tablet Commonly known as: IMDUR Take 30 mg by mouth daily.   losartan 100 MG tablet Commonly known as: COZAAR TAKE 1 TABLET BY MOUTH DAILY AS DIRECTED   metoprolol succinate 25 MG 24 hr tablet Commonly known as: TOPROL-XL TAKE 1 TABLET BY MOUTH DAILY   montelukast 10 MG tablet Commonly known as: SINGULAIR TAKE 1 TABLET BY MOUTH AT BEDTIME   pantoprazole 40 MG tablet Commonly known as: PROTONIX TAKE 1 TABLET BY MOUTH DAILY FOR REFLUX/HEARTBURN   potassium chloride 10 MEQ tablet Commonly known as: KLOR-CON TAKE 2 TABLETS BY MOUTH DAILY   rOPINIRole 0.5 MG tablet Commonly known as: REQUIP Take 1 tablet (0.5 mg total) by mouth at bedtime.   rosuvastatin 10 MG tablet Commonly known as: CRESTOR TAKE 1 TABLET BY MOUTH AT BEDTIME   Vimpat 150 MG Tabs Generic drug: Lacosamide Take 150 mg by mouth every 12 (twelve) hours.        Discharge Exam: Filed Weights   09/12/23 6213 09/12/23 1409  Weight: 65.8 kg 68.8 kg   GEN: NAD SKIN: Warm and dry EYES: No pallor or icterus ENT: MMM CV: RRR PULM: CTA B ABD: soft, ND, NT, +BS CNS: AAO x 3, non focal EXT: No edema or  tenderness   Condition at discharge: good  The results of significant diagnostics from this hospitalization (including imaging, microbiology, ancillary and laboratory) are listed below for reference.   Imaging Studies: CT Head Wo Contrast Result Date: 09/12/2023 CLINICAL DATA:  73 year old female with new onset seizure. EXAM: CT HEAD WITHOUT CONTRAST TECHNIQUE: Contiguous axial images were obtained from the base of the skull through the vertex without intravenous contrast. RADIATION DOSE REDUCTION: This exam was performed according to the departmental dose-optimization program which includes automated exposure control, adjustment of the mA and/or kV according to patient size and/or use of iterative reconstruction technique. COMPARISON:  Brain MRI 03/14/2023.  Head CT 02/24/2023. FINDINGS: Brain: Cerebral volume remains normal for age. No midline shift, ventriculomegaly, mass effect, evidence of mass lesion, intracranial hemorrhage or evidence of cortically based acute infarction. Stable gray-white differentiation, mild to moderate for age chronic white matter heterogeneity. Vascular: No suspicious intracranial vascular hyperdensity. Calcified atherosclerosis at the skull base. Skull: Stable.  No  acute osseous abnormality identified. Sinuses/Orbits: Chronic left mastoidectomy. Visible paranasal sinuses are stable and well aerated. Other: No acute orbit or scalp soft tissue finding. IMPRESSION: No acute intracranial abnormality. Stable non contrast CT appearance of the chronic white matter disease. Electronically Signed   By: Odessa Fleming M.D.   On: 09/12/2023 11:16   DG Chest 2 View Result Date: 09/12/2023 CLINICAL DATA:  Shortness of breath.  Hypoxia. EXAM: CHEST - 2 VIEW COMPARISON:  07/05/2023 FINDINGS: The heart size and mediastinal contours are within normal limits. New atelectasis is seen in the lingula. No pleural effusion. Right shoulder prosthesis again noted. IMPRESSION: New lingular atelectasis.  Electronically Signed   By: Danae Orleans M.D.   On: 09/12/2023 09:26    Microbiology: Results for orders placed or performed during the hospital encounter of 09/12/23  Resp panel by RT-PCR (RSV, Flu A&B, Covid) Anterior Nasal Swab     Status: None   Collection Time: 09/12/23  9:09 AM   Specimen: Anterior Nasal Swab  Result Value Ref Range Status   SARS Coronavirus 2 by RT PCR NEGATIVE NEGATIVE Final    Comment: (NOTE) SARS-CoV-2 target nucleic acids are NOT DETECTED.  The SARS-CoV-2 RNA is generally detectable in upper respiratory specimens during the acute phase of infection. The lowest concentration of SARS-CoV-2 viral copies this assay can detect is 138 copies/mL. A negative result does not preclude SARS-Cov-2 infection and should not be used as the sole basis for treatment or other patient management decisions. A negative result may occur with  improper specimen collection/handling, submission of specimen other than nasopharyngeal swab, presence of viral mutation(s) within the areas targeted by this assay, and inadequate number of viral copies(<138 copies/mL). A negative result must be combined with clinical observations, patient history, and epidemiological information. The expected result is Negative.  Fact Sheet for Patients:  BloggerCourse.com  Fact Sheet for Healthcare Providers:  SeriousBroker.it  This test is no t yet approved or cleared by the [place] FDA and  has been authorized for detection and/or diagnosis of SARS-CoV-2 by FDA under an Emergency Use Authorization (EUA). This EUA will remain  in effect (meaning this test can be used) for the duration of the COVID-19 declaration under Section 564(b)(1) of the Act, 21 U.S.C.section 360bbb-3(b)(1), unless the authorization is terminated  or revoked sooner.       Influenza A by PCR NEGATIVE NEGATIVE Final   Influenza B by PCR NEGATIVE NEGATIVE Final     Comment: (NOTE) The Xpert Xpress SARS-CoV-2/FLU/RSV plus assay is intended as an aid in the diagnosis of influenza from Nasopharyngeal swab specimens and should not be used as a sole basis for treatment. Nasal washings and aspirates are unacceptable for Xpert Xpress SARS-CoV-2/FLU/RSV testing.  Fact Sheet for Patients: BloggerCourse.com  Fact Sheet for Healthcare Providers: SeriousBroker.it  This test is not yet approved or cleared by the [place] FDA and has been authorized for detection and/or diagnosis of SARS-CoV-2 by FDA under an Emergency Use Authorization (EUA). This EUA will remain in effect (meaning this test can be used) for the duration of the COVID-19 declaration under Section 564(b)(1) of the Act, 21 U.S.C. section 360bbb-3(b)(1), unless the authorization is terminated or revoked.     Resp Syncytial Virus by PCR NEGATIVE NEGATIVE Final    Comment: (NOTE) Fact Sheet for Patients: BloggerCourse.com  Fact Sheet for Healthcare Providers: SeriousBroker.it  This test is not yet approved or cleared by the [place] FDA and has been authorized for detection and/or diagnosis  of SARS-CoV-2 by FDA under an Emergency Use Authorization (EUA). This EUA will remain in effect (meaning this test can be used) for the duration of the COVID-19 declaration under Section 564(b)(1) of the Act, 21 U.S.C. section 360bbb-3(b)(1), unless the authorization is terminated or revoked.  Performed at [facility], [address] [place], [place] [address]   Blood culture (routine x 2)     Status: None (Preliminary result)   Collection Time: 09/12/23 11:29 AM   Specimen: BLOOD  Result Value Ref Range Status   Specimen Description BLOOD BLOOD LEFT ARM  Final   Special Requests   Final    BOTTLES DRAWN AEROBIC AND ANAEROBIC Blood Culture adequate volume   Culture   Final     NO GROWTH 4 DAYS Performed at [facility], [address] [place], [place] [address]    Report Status PENDING  Incomplete  MRSA Next Gen by PCR, Nasal     Status: None   Collection Time: 09/12/23  2:11 PM   Specimen: Nasal Mucosa; Nasal Swab  Result Value Ref Range Status   MRSA by PCR Next Gen NOT DETECTED NOT DETECTED Final    Comment: (NOTE) The GeneXpert MRSA Assay (FDA approved for NASAL specimens only), is one component of a comprehensive MRSA colonization surveillance program. It is not intended to diagnose MRSA infection nor to guide or monitor treatment for MRSA infections. Test performance is not FDA approved in patients less than 18 years old. Performed at [facility], [address] [place], [place] [address]   Culture, blood (Routine X 2) w Reflex to ID Panel     Status: None (Preliminary result)   Collection Time: 09/12/23  2:26 PM   Specimen: BLOOD RIGHT ARM  Result Value Ref Range Status   Specimen Description BLOOD RIGHT ARM  Final   Special Requests   Final    BOTTLES DRAWN AEROBIC AND ANAEROBIC Blood Culture adequate volume   Culture   Final    NO GROWTH 4 DAYS Performed at [facility], [address] [place], [place] [address]    Report Status PENDING  Incomplete    Labs: CBC: Recent Labs  Lab 09/12/23 0909 09/13/23 0509  WBC 5.8 6.3  NEUTROABS 3.5  --   HGB 13.6 12.3  HCT 41.7 37.8  MCV 83.6 80.6  PLT 244 236   Basic Metabolic Panel: Recent Labs  Lab 09/12/23 0909 09/12/23 1912 09/13/23 0010 09/13/23 0509  NA 141  --   --  141  K 2.9*  --  3.5 3.8  CL 103  --   --  106  CO2 30  --   --  24  GLUCOSE 154*  --   --  198*  BUN 9  --   --  14  CREATININE 0.59  --   --  0.69  CALCIUM 8.8*  --   --  8.4*  MG  --  1.3*  --  2.5*  PHOS  --   --   --  3.4   Liver Function Tests: No results for input(s): "AST", "ALT", "ALKPHOS", "BILITOT", "PROT", "ALBUMIN" in the last 168 hours. CBG: Recent Labs  Lab  09/15/23 0736 09/15/23 1115 09/15/23 1630 09/15/23 1956 09/16/23 0742  GLUCAP 117* 175* 186* 217* 89    Discharge time spent: greater than 30 minutes.  Signed: Lurene Shadow, MD [facility] 09/16/2023

## 2023-09-16 NOTE — Progress Notes (Signed)
*  PRELIMINARY RESULTS* Echocardiogram 2D Echocardiogram has been performed.  [name] 09/16/2023, 1:13 PM

## 2023-09-16 NOTE — Plan of Care (Signed)

## 2023-09-16 NOTE — Consult Note (Signed)
 [facility] Liaison Note  09/16/2023  [name] September 29, 1950 [id number]  Location: RN Hospital Liaison screened the patient remotely at [facility].  Insurance: [employer]   [name] is a 73 y.o. female who is a Primary Care Patient of [name], NP The patient was screened for day readmission hospitalization with noted medium risk score for unplanned readmission risk with 3 IP/1 ED in 6 months.  The patient was assessed for potential Care Management service needs for post hospital transition for care coordination. Review of patient's electronic medical record reveals patient Acute on chronic respiratory failure with hypoxemia. Pt will be discharged today with home with self-care. No anticipated needs at this time for VBCI.   Plan: [facility] Liaison will continue to follow progress and disposition to asess for post hospital community care coordination/management needs.  Referral request for community care coordination: anticipate Transitions of Care Team follow up.   VBCI Care Management/Population Health does not replace or interfere with any arrangements made by the Inpatient Transition of Care team.   For questions contact:   Elliot Cousin, RN, BSN Hospital Liaison [place]   [facility], Population Health Office Hours MTWF  8:00 am-6:00 pm Direct Dial: [phone] mobile [name]

## 2023-09-17 ENCOUNTER — Telehealth: Payer: Self-pay

## 2023-09-17 DIAGNOSIS — I5032 Chronic diastolic (congestive) heart failure: Secondary | ICD-10-CM | POA: Diagnosis not present

## 2023-09-17 DIAGNOSIS — I5033 Acute on chronic diastolic (congestive) heart failure: Secondary | ICD-10-CM | POA: Diagnosis not present

## 2023-09-17 DIAGNOSIS — I350 Nonrheumatic aortic (valve) stenosis: Secondary | ICD-10-CM | POA: Diagnosis not present

## 2023-09-17 DIAGNOSIS — R931 Abnormal findings on diagnostic imaging of heart and coronary circulation: Secondary | ICD-10-CM | POA: Diagnosis not present

## 2023-09-17 DIAGNOSIS — R4701 Aphasia: Secondary | ICD-10-CM | POA: Diagnosis not present

## 2023-09-17 DIAGNOSIS — K7581 Nonalcoholic steatohepatitis (NASH): Secondary | ICD-10-CM | POA: Diagnosis not present

## 2023-09-17 DIAGNOSIS — R404 Transient alteration of awareness: Secondary | ICD-10-CM | POA: Diagnosis not present

## 2023-09-17 DIAGNOSIS — H9190 Unspecified hearing loss, unspecified ear: Secondary | ICD-10-CM | POA: Diagnosis not present

## 2023-09-17 DIAGNOSIS — R918 Other nonspecific abnormal finding of lung field: Secondary | ICD-10-CM | POA: Diagnosis not present

## 2023-09-17 DIAGNOSIS — I10 Essential (primary) hypertension: Secondary | ICD-10-CM | POA: Diagnosis not present

## 2023-09-17 DIAGNOSIS — J811 Chronic pulmonary edema: Secondary | ICD-10-CM | POA: Diagnosis not present

## 2023-09-17 DIAGNOSIS — I6529 Occlusion and stenosis of unspecified carotid artery: Secondary | ICD-10-CM | POA: Diagnosis not present

## 2023-09-17 DIAGNOSIS — I214 Non-ST elevation (NSTEMI) myocardial infarction: Secondary | ICD-10-CM | POA: Diagnosis not present

## 2023-09-17 DIAGNOSIS — Z9981 Dependence on supplemental oxygen: Secondary | ICD-10-CM | POA: Diagnosis not present

## 2023-09-17 DIAGNOSIS — J690 Pneumonitis due to inhalation of food and vomit: Secondary | ICD-10-CM | POA: Diagnosis not present

## 2023-09-17 DIAGNOSIS — J441 Chronic obstructive pulmonary disease with (acute) exacerbation: Secondary | ICD-10-CM | POA: Diagnosis not present

## 2023-09-17 DIAGNOSIS — R0602 Shortness of breath: Secondary | ICD-10-CM | POA: Diagnosis not present

## 2023-09-17 DIAGNOSIS — E782 Mixed hyperlipidemia: Secondary | ICD-10-CM | POA: Diagnosis not present

## 2023-09-17 DIAGNOSIS — E8809 Other disorders of plasma-protein metabolism, not elsewhere classified: Secondary | ICD-10-CM | POA: Diagnosis not present

## 2023-09-17 DIAGNOSIS — R110 Nausea: Secondary | ICD-10-CM | POA: Diagnosis not present

## 2023-09-17 DIAGNOSIS — I2510 Atherosclerotic heart disease of native coronary artery without angina pectoris: Secondary | ICD-10-CM | POA: Diagnosis not present

## 2023-09-17 DIAGNOSIS — G2581 Restless legs syndrome: Secondary | ICD-10-CM | POA: Diagnosis not present

## 2023-09-17 DIAGNOSIS — R109 Unspecified abdominal pain: Secondary | ICD-10-CM | POA: Diagnosis not present

## 2023-09-17 DIAGNOSIS — I358 Other nonrheumatic aortic valve disorders: Secondary | ICD-10-CM | POA: Diagnosis not present

## 2023-09-17 DIAGNOSIS — R569 Unspecified convulsions: Secondary | ICD-10-CM | POA: Diagnosis not present

## 2023-09-17 DIAGNOSIS — I5031 Acute diastolic (congestive) heart failure: Secondary | ICD-10-CM | POA: Diagnosis not present

## 2023-09-17 DIAGNOSIS — T886XXA Anaphylactic reaction due to adverse effect of correct drug or medicament properly administered, initial encounter: Secondary | ICD-10-CM | POA: Diagnosis not present

## 2023-09-17 DIAGNOSIS — J9621 Acute and chronic respiratory failure with hypoxia: Secondary | ICD-10-CM | POA: Diagnosis not present

## 2023-09-17 DIAGNOSIS — I5030 Unspecified diastolic (congestive) heart failure: Secondary | ICD-10-CM | POA: Diagnosis not present

## 2023-09-17 DIAGNOSIS — J449 Chronic obstructive pulmonary disease, unspecified: Secondary | ICD-10-CM | POA: Diagnosis not present

## 2023-09-17 DIAGNOSIS — K7469 Other cirrhosis of liver: Secondary | ICD-10-CM | POA: Diagnosis not present

## 2023-09-17 DIAGNOSIS — Z8673 Personal history of transient ischemic attack (TIA), and cerebral infarction without residual deficits: Secondary | ICD-10-CM | POA: Diagnosis not present

## 2023-09-17 DIAGNOSIS — T39015A Adverse effect of aspirin, initial encounter: Secondary | ICD-10-CM | POA: Diagnosis not present

## 2023-09-17 DIAGNOSIS — J439 Emphysema, unspecified: Secondary | ICD-10-CM | POA: Diagnosis not present

## 2023-09-17 DIAGNOSIS — J90 Pleural effusion, not elsewhere classified: Secondary | ICD-10-CM | POA: Diagnosis not present

## 2023-09-17 DIAGNOSIS — R519 Headache, unspecified: Secondary | ICD-10-CM | POA: Diagnosis not present

## 2023-09-17 DIAGNOSIS — R7989 Other specified abnormal findings of blood chemistry: Secondary | ICD-10-CM | POA: Diagnosis not present

## 2023-09-17 DIAGNOSIS — R0902 Hypoxemia: Secondary | ICD-10-CM | POA: Diagnosis not present

## 2023-09-17 DIAGNOSIS — I110 Hypertensive heart disease with heart failure: Secondary | ICD-10-CM | POA: Diagnosis not present

## 2023-09-17 DIAGNOSIS — E119 Type 2 diabetes mellitus without complications: Secondary | ICD-10-CM | POA: Diagnosis not present

## 2023-09-17 LAB — CULTURE, BLOOD (ROUTINE X 2)
Culture: NO GROWTH
Culture: NO GROWTH
Special Requests: ADEQUATE
Special Requests: ADEQUATE

## 2023-09-17 NOTE — Transitions of Care (Post Inpatient/ED Visit) (Signed)
   09/17/2023  Name: [name] MRN: [id number] DOB: [birth year]  Today's TOC FU Call Status: Today's TOC FU Call Status:: Successful TOC FU Call Completed TOC FU Call Complete Date: 09/17/23 Patient's Name and Date of Birth confirmed.  Transition Care Management Follow-up Telephone Call Date of Discharge: 09/16/23 Discharge Facility: [facility]) Type of Discharge: Inpatient Admission Primary Inpatient Discharge Diagnosis:: COPD Exacerbation How have you been since you were released from the hospital?: Worse (Re-admitted to [facility])  Items Reviewed:    Medications Reviewed Today: Medications Reviewed Today   Medications were not reviewed in this encounter     Home Care and Equipment/Supplies:    Functional Questionnaire:    Follow up appointments reviewed:    The patient is at [facility]. She was home for 12 hours and became SOB and went back to the hospital  [facility], BSN, RN [place]  VBCI - [facility] RN Care Manager [phone]

## 2023-09-22 LAB — BLOOD GAS, VENOUS
Acid-Base Excess: 8.2 mmol/L — ABNORMAL HIGH (ref 0.0–2.0)
Bicarbonate: 34.4 mmol/L — ABNORMAL HIGH (ref 20.0–28.0)
O2 Saturation: 49.5 %
Patient temperature: 37
pCO2, Ven: 53 mmHg (ref 44–60)
pH, Ven: 7.42 (ref 7.25–7.43)

## 2023-09-24 ENCOUNTER — Telehealth: Payer: Self-pay

## 2023-09-24 NOTE — Transitions of Care (Post Inpatient/ED Visit) (Signed)
 09/24/2023  Name: [name] MRN: [id number] DOB: Nov 05, 1950  Today's TOC FU Call Status: Today's TOC FU Call Status:: Successful TOC FU Call Completed TOC FU Call Complete Date: 09/24/23 Patient's Name and Date of Birth confirmed.  Transition Care Management Follow-up Telephone Call Date of Discharge: 09/23/23 Discharge Facility: Other ([facility]) Name of Other ([facility]) Discharge Facility: [facility] Type of Discharge: Inpatient Admission Primary Inpatient Discharge Diagnosis:: Hear Attack How have you been since you were released from the hospital?: Better Any questions or concerns?: No  Items Reviewed: Did you receive and understand the discharge instructions provided?: Yes Medications obtained,verified, and reconciled?: Yes (Medications Reviewed) Any new allergies since your discharge?: No Dietary orders reviewed?: Yes Type of Diet Ordered:: Low sodium, Heart Healthy Do you have support at home?: Yes People in Home: child(ren), adult Name of Support/Comfort Primary Source: [name]  Medications Reviewed Today: Medications Reviewed Today     Reviewed by Redge Gainer, RN (Case Manager) on 09/24/23 at [phone]  Med List Status: <None>   Medication Order Taking? Sig Documenting Provider Last Dose Status Informant  acyclovir ointment (ZOVIRAX) 5 % [id number] No Apply 1 Application topically 5 (five) times daily. X 4 days. Start at onset of symptoms. [name], NP Past Month Active Child, Pharmacy Records, Multiple Informants  citalopram (CELEXA) 10 MG tablet [id number] No TAKE 1 TABLET BY MOUTH DAILY [name], NP 09/11/2023 Morning Active Child, Pharmacy Records, Multiple Informants  clopidogrel (PLAVIX) 75 MG tablet [id number] No TAKE 1 TABLET BY MOUTH DAILY [name], NP 09/11/2023 Morning Active Child, Pharmacy Records, Multiple Informants  fluticasone (FLONASE) 50 MCG/ACT nasal spray [id number] No Place 1 spray into both nostrils 2 (two) times  daily. [provider] Past Week Active Child, Pharmacy Records, Multiple Informants  furosemide (LASIX) 40 MG tablet [id number] No Take 1 tablet (40 mg total) by mouth daily. [name], NP 09/11/2023 Morning Active Child, Pharmacy Records, Multiple Informants  gabapentin (NEURONTIN) 100 MG capsule [id number] No Take 2 capsules (200 mg total) by mouth at bedtime. [name], NP 09/11/2023 Bedtime Active Child, Pharmacy Records, Multiple Informants  glimepiride (AMARYL) 2 MG tablet [id number] No TAKE 1 TABLET BY MOUTH DAILY FOR DIABETES  Patient taking differently: Take 2 mg by mouth daily with breakfast.   [name], NP 09/11/2023 Morning Active Child, Pharmacy Records, Multiple Informants  ipratropium (ATROVENT) 0.02 % nebulizer solution [id number] No Take 2.5 mLs (0.5 mg total) by nebulization 4 (four) times daily. Jene Every, MD Past Month Active Child, Pharmacy Records, Multiple Informants  isosorbide mononitrate (IMDUR) 30 MG 24 hr tablet [id number] No Take 30 mg by mouth daily. [provider] 09/11/2023 Morning Active Child, Pharmacy Records, Multiple Informants  Lacosamide (VIMPAT) 150 MG TABS [id number] No Take 150 mg by mouth every 12 (twelve) hours. [provider] 09/11/2023 Evening Active Child, Pharmacy Records, Multiple Informants  losartan (COZAAR) 100 MG tablet [id number] No TAKE 1 TABLET BY MOUTH DAILY AS DIRECTED [name], NP 09/11/2023 Morning Active Child, Pharmacy Records, Multiple Informants  metoprolol succinate (TOPROL-XL) 25 MG 24 hr tablet [id number] No TAKE 1 TABLET BY MOUTH DAILY [name], NP 09/11/2023 Morning Active Child, Pharmacy Records, Multiple Informants  montelukast (SINGULAIR) 10 MG tablet [id number] No TAKE 1 TABLET BY MOUTH AT BEDTIME [name], NP 09/11/2023 Bedtime Active Child, Pharmacy Records, Multiple Informants  pantoprazole (PROTONIX) 40 MG tablet [id number] No TAKE 1 TABLET BY MOUTH DAILY FOR REFLUX/HEARTBURN [name], NP  09/11/2023 Evening Active Child, Pharmacy Records, Multiple Informants  potassium chloride (KLOR-CON) 10 MEQ tablet [id number] No TAKE 2 TABLETS BY MOUTH DAILY [name], NP 09/11/2023 Morning Active Child, Pharmacy Records, Multiple Informants  rOPINIRole (REQUIP) 0.5 MG tablet [id number] No Take 1 tablet (0.5 mg total) by mouth at bedtime. Enedina Finner, MD 09/11/2023 Bedtime Active Child, Pharmacy Records, Multiple Informants  rosuvastatin (CRESTOR) 10 MG tablet [id number] No TAKE 1 TABLET BY MOUTH AT BEDTIME [name], NP 09/11/2023 Bedtime Active Child, Pharmacy Records, Multiple Informants            Home Care and Equipment/Supplies: Were Home Health Services Ordered?: Yes Name of Home Health Agency:: Unsure. The patient's son is not at home to check the paperwork Has Agency set up a time to come to your home?: No EMR reviewed for Home Health Orders: Orders present/patient has not received call (refer to CM for follow-up) Any new equipment or medical supplies ordered?: NA  Functional Questionnaire: Do you need assistance with bathing/showering or dressing?: Yes (Supervision) Do you need assistance with meal preparation?: Yes (Her son and finance provide) Do you need assistance with eating?: No Do you have difficulty maintaining continence: No Do you need assistance with getting out of bed/getting out of a chair/moving?: Yes (Supervision and a walker) Do you have difficulty managing or taking your medications?: Yes (The patient's son assists her)  Follow up appointments reviewed: PCP Follow-up appointment confirmed?: Yes Date of PCP follow-up appointment?: 10/02/23 Follow-up Provider: [name] Specialist [facility] Follow-up appointment confirmed?: Yes Date of Specialist follow-up appointment?: 10/01/23 Follow-Up Specialty Provider:: [employer] provider Do you need transportation to your follow-up appointment?: No Do you understand care options if your condition(s) worsen?:  Yes-patient verbalized understanding  SDOH Interventions Today    Flowsheet Row Most Recent Value  SDOH Interventions   Food Insecurity Interventions Intervention Not Indicated  Housing Interventions Intervention Not Indicated  Transportation Interventions Intervention Not Indicated  Utilities Interventions Intervention Not Indicated      Interventions Today    Flowsheet Row Most Recent Value  Chronic Disease   Chronic disease during today's visit Congestive Heart Failure (CHF)  General Interventions   General Interventions Discussed/Reviewed General Interventions Discussed, General Interventions Reviewed, Doctor Visits  Doctor Visits Discussed/Reviewed Doctor Visits Reviewed  Exercise Interventions   Exercise Discussed/Reviewed Physical Activity  Physical Activity Discussed/Reviewed Physical Activity Reviewed  Education Interventions   Education Provided Provided Education  Provided Verbal Education On When to see the doctor, Medication, Other  [Home Health]  Pharmacy Interventions   Pharmacy Dicussed/Reviewed Medications and their functions       The patient has been provided with contact information for the care management team and has been advised to call with any health-related questions or concerns. The patient verbalized understanding with current POC. The patient is directed to their insurance card regarding availability of benefits coverage.  Deidre Ala, BSN, RN [place]  VBCI - [employer] [facility] RN Care Manager [phone]

## 2023-09-25 ENCOUNTER — Telehealth: Payer: Self-pay

## 2023-09-25 NOTE — Telephone Encounter (Signed)
 Copied from CRM [id number]. Topic: Clinical - Home Health Verbal Orders >> Sep 25, 2023 10:32 AM Ernst Spell wrote: Caller/Agency: Drucilla Schmidt Number: [phone] (secured) Service Requested: Physical Therapy Josh explained that the patient was admitted to [facility] & was dxs w/ hypertension. [facility] sent a referral to [facility] for PT. Josh would like to confirm with [facility] regarding the request.

## 2023-09-25 NOTE — Telephone Encounter (Signed)
 Called and orders provided

## 2023-09-30 ENCOUNTER — Telehealth: Payer: Self-pay

## 2023-09-30 DIAGNOSIS — E119 Type 2 diabetes mellitus without complications: Secondary | ICD-10-CM

## 2023-09-30 NOTE — Telephone Encounter (Signed)
 On site interpreter needs to be requested for pts appt this Friday 10-02-23

## 2023-10-01 ENCOUNTER — Encounter: Payer: Self-pay | Admitting: *Deleted

## 2023-10-01 DIAGNOSIS — E782 Mixed hyperlipidemia: Secondary | ICD-10-CM | POA: Diagnosis not present

## 2023-10-01 DIAGNOSIS — I214 Non-ST elevation (NSTEMI) myocardial infarction: Secondary | ICD-10-CM | POA: Diagnosis not present

## 2023-10-01 DIAGNOSIS — Z7951 Long term (current) use of inhaled steroids: Secondary | ICD-10-CM | POA: Diagnosis not present

## 2023-10-01 DIAGNOSIS — Z7984 Long term (current) use of oral hypoglycemic drugs: Secondary | ICD-10-CM | POA: Diagnosis not present

## 2023-10-01 DIAGNOSIS — I2584 Coronary atherosclerosis due to calcified coronary lesion: Secondary | ICD-10-CM | POA: Diagnosis not present

## 2023-10-01 DIAGNOSIS — Z9981 Dependence on supplemental oxygen: Secondary | ICD-10-CM | POA: Diagnosis not present

## 2023-10-01 DIAGNOSIS — G2581 Restless legs syndrome: Secondary | ICD-10-CM | POA: Diagnosis not present

## 2023-10-01 DIAGNOSIS — J449 Chronic obstructive pulmonary disease, unspecified: Secondary | ICD-10-CM | POA: Diagnosis not present

## 2023-10-01 DIAGNOSIS — Z7902 Long term (current) use of antithrombotics/antiplatelets: Secondary | ICD-10-CM | POA: Diagnosis not present

## 2023-10-01 DIAGNOSIS — I5033 Acute on chronic diastolic (congestive) heart failure: Secondary | ICD-10-CM | POA: Diagnosis not present

## 2023-10-01 DIAGNOSIS — E119 Type 2 diabetes mellitus without complications: Secondary | ICD-10-CM | POA: Diagnosis not present

## 2023-10-01 DIAGNOSIS — K219 Gastro-esophageal reflux disease without esophagitis: Secondary | ICD-10-CM | POA: Diagnosis not present

## 2023-10-01 DIAGNOSIS — J690 Pneumonitis due to inhalation of food and vomit: Secondary | ICD-10-CM | POA: Diagnosis not present

## 2023-10-01 DIAGNOSIS — J441 Chronic obstructive pulmonary disease with (acute) exacerbation: Secondary | ICD-10-CM | POA: Diagnosis not present

## 2023-10-01 DIAGNOSIS — J9611 Chronic respiratory failure with hypoxia: Secondary | ICD-10-CM | POA: Diagnosis not present

## 2023-10-01 DIAGNOSIS — I110 Hypertensive heart disease with heart failure: Secondary | ICD-10-CM | POA: Diagnosis not present

## 2023-10-01 DIAGNOSIS — I2510 Atherosclerotic heart disease of native coronary artery without angina pectoris: Secondary | ICD-10-CM | POA: Diagnosis not present

## 2023-10-01 DIAGNOSIS — K7581 Nonalcoholic steatohepatitis (NASH): Secondary | ICD-10-CM | POA: Diagnosis not present

## 2023-10-01 DIAGNOSIS — E8809 Other disorders of plasma-protein metabolism, not elsewhere classified: Secondary | ICD-10-CM | POA: Diagnosis not present

## 2023-10-01 DIAGNOSIS — I5032 Chronic diastolic (congestive) heart failure: Secondary | ICD-10-CM | POA: Diagnosis not present

## 2023-10-01 DIAGNOSIS — Z720 Tobacco use: Secondary | ICD-10-CM | POA: Diagnosis not present

## 2023-10-01 DIAGNOSIS — I10 Essential (primary) hypertension: Secondary | ICD-10-CM | POA: Diagnosis not present

## 2023-10-01 DIAGNOSIS — I350 Nonrheumatic aortic (valve) stenosis: Secondary | ICD-10-CM | POA: Diagnosis not present

## 2023-10-01 DIAGNOSIS — E785 Hyperlipidemia, unspecified: Secondary | ICD-10-CM | POA: Diagnosis not present

## 2023-10-01 DIAGNOSIS — K7469 Other cirrhosis of liver: Secondary | ICD-10-CM | POA: Diagnosis not present

## 2023-10-01 DIAGNOSIS — J439 Emphysema, unspecified: Secondary | ICD-10-CM | POA: Diagnosis not present

## 2023-10-01 DIAGNOSIS — Z8673 Personal history of transient ischemic attack (TIA), and cerebral infarction without residual deficits: Secondary | ICD-10-CM | POA: Diagnosis not present

## 2023-10-02 ENCOUNTER — Ambulatory Visit (INDEPENDENT_AMBULATORY_CARE_PROVIDER_SITE_OTHER): Admitting: Nurse Practitioner

## 2023-10-02 VITALS — BP 110/64 | HR 60 | Temp 98.2°F | Ht 63.0 in | Wt 143.4 lb

## 2023-10-02 DIAGNOSIS — Z7984 Long term (current) use of oral hypoglycemic drugs: Secondary | ICD-10-CM

## 2023-10-02 DIAGNOSIS — Z9981 Dependence on supplemental oxygen: Secondary | ICD-10-CM | POA: Diagnosis not present

## 2023-10-02 DIAGNOSIS — Z09 Encounter for follow-up examination after completed treatment for conditions other than malignant neoplasm: Secondary | ICD-10-CM

## 2023-10-02 DIAGNOSIS — I5032 Chronic diastolic (congestive) heart failure: Secondary | ICD-10-CM

## 2023-10-02 DIAGNOSIS — J9612 Chronic respiratory failure with hypercapnia: Secondary | ICD-10-CM

## 2023-10-02 DIAGNOSIS — E119 Type 2 diabetes mellitus without complications: Secondary | ICD-10-CM | POA: Diagnosis not present

## 2023-10-02 DIAGNOSIS — J449 Chronic obstructive pulmonary disease, unspecified: Secondary | ICD-10-CM | POA: Diagnosis not present

## 2023-10-02 DIAGNOSIS — R569 Unspecified convulsions: Secondary | ICD-10-CM | POA: Diagnosis not present

## 2023-10-02 DIAGNOSIS — J9611 Chronic respiratory failure with hypoxia: Secondary | ICD-10-CM

## 2023-10-02 LAB — POCT GLYCOSYLATED HEMOGLOBIN (HGB A1C)
HbA1c POC (<> result, manual entry): 6.9 % (ref 4.0–5.6)
HbA1c, POC (controlled diabetic range): 6.9 % (ref 0.0–7.0)
HbA1c, POC (prediabetic range): 6.9 % — AB (ref 5.7–6.4)
Hemoglobin A1C: 6.9 % — AB (ref 4.0–5.6)

## 2023-10-02 MED ORDER — BLOOD GLUCOSE TEST VI STRP: 1.0000 | ORAL_STRIP | Freq: Every day | 2 refills | Status: AC

## 2023-10-02 MED ORDER — LANCETS MISC. MISC: 1.0000 | Freq: Every day | 3 refills | Status: DC

## 2023-10-02 MED ORDER — BLOOD GLUCOSE MONITORING SUPPL DEVI: 1.0000 | Freq: Every day | 0 refills | Status: DC

## 2023-10-02 MED ORDER — LANCET DEVICE MISC: 1.0000 | Freq: Every day | 0 refills | Status: DC

## 2023-10-02 NOTE — Progress Notes (Signed)
 Karen Dicker, [name] Phone: [phone]  [name] is a 73 y.o. female who presents today for hospital follow up.   Discussed the use of AI scribe software for clinical note transcription with the patient, who gave verbal consent to proceed.  History of Present Illness   [name] is a 73 year old female with COPD and a history of seizures who presents for a hospital follow-up after recent admissions for shortness of breath and a seizure. Sign language interpreter, Consuella Lose present.   She recently had two hospital admissions. The first was for shortness of breath, during which she was treated and experienced a seizure. She was discharged after about a week. The second admission was due to a suspected heart attack, later clarified as stress on the heart from COPD. During this admission, she underwent cardiac catheterization, which showed no significant blockage, and a stent was placed. She was treated for fluid overload, contributing to her shortness of breath.  She has a history of COPD and recently saw a pulmonologist who adjusted her inhaler regimen. She has started the new inhaler since her hospital discharge. No current shortness of breath or chest pain. She uses Mucinex 12-hour over-the-counter but does not find it significantly helpful. She is not currently wearing her oxygen, which she keeps in the car, and her oxygen saturation is typically around 90% without supplemental oxygen.  She has a history of seizures, recently described as pseudo-seizures triggered by pain or respiratory distress. She has not seen a neurologist regularly but had consultations during her hospital stays.  Her current medications include spironolactone, ezetimibe, and glimepiride. Her blood sugar management is suboptimal as she is not regularly checking her levels, although she is taking glimepiride. She requires additional test strips for monitoring her blood sugar.      Social History   Tobacco Use   Smoking Status Every Day   Current packs/day: 0.50   Average packs/day: 0.5 packs/day for 55.6 years (27.8 ttl pk-yrs)   Types: Cigarettes   Start date: 03/01/1968  Smokeless Tobacco Never    Current Outpatient Medications on File Prior to Visit  Medication Sig Dispense Refill   ezetimibe (ZETIA) 10 MG tablet Take 1 tablet by mouth daily.     spironolactone (ALDACTONE) 25 MG tablet Take 1 tablet by mouth daily.     acyclovir ointment (ZOVIRAX) 5 % Apply 1 Application topically 5 (five) times daily. X 4 days. Start at onset of symptoms. 15 g 5   citalopram (CELEXA) 10 MG tablet TAKE 1 TABLET BY MOUTH DAILY 90 tablet 3   clopidogrel (PLAVIX) 75 MG tablet TAKE 1 TABLET BY MOUTH DAILY 90 tablet 1   fluticasone (FLONASE) 50 MCG/ACT nasal spray Place 1 spray into both nostrils 2 (two) times daily.     furosemide (LASIX) 40 MG tablet Take 1 tablet (40 mg total) by mouth daily. 90 tablet 3   gabapentin (NEURONTIN) 100 MG capsule Take 2 capsules (200 mg total) by mouth at bedtime. 180 capsule 3   glimepiride (AMARYL) 2 MG tablet TAKE 1 TABLET BY MOUTH DAILY FOR DIABETES (Patient taking differently: Take 2 mg by mouth daily with breakfast.) 90 tablet 3   ipratropium (ATROVENT) 0.02 % nebulizer solution Take 2.5 mLs (0.5 mg total) by nebulization 4 (four) times daily. 75 mL 12   isosorbide mononitrate (IMDUR) 30 MG 24 hr tablet Take 30 mg by mouth daily.     Lacosamide (VIMPAT) 150 MG TABS Take 150 mg by mouth every 12 (  twelve) hours.     losartan (COZAAR) 100 MG tablet TAKE 1 TABLET BY MOUTH DAILY AS DIRECTED 90 tablet 3   metoprolol succinate (TOPROL-XL) 25 MG 24 hr tablet TAKE 1 TABLET BY MOUTH DAILY 90 tablet 3   montelukast (SINGULAIR) 10 MG tablet TAKE 1 TABLET BY MOUTH AT BEDTIME 90 tablet 3   pantoprazole (PROTONIX) 40 MG tablet TAKE 1 TABLET BY MOUTH DAILY FOR REFLUX/HEARTBURN 90 tablet 3   potassium chloride (KLOR-CON) 10 MEQ tablet TAKE 2 TABLETS BY MOUTH DAILY 180 tablet 3   rOPINIRole  (REQUIP) 0.5 MG tablet Take 1 tablet (0.5 mg total) by mouth at bedtime. 30 tablet 0   rosuvastatin (CRESTOR) 10 MG tablet TAKE 1 TABLET BY MOUTH AT BEDTIME 90 tablet 3   No current facility-administered medications on file prior to visit.     ROS see history of present illness  Objective  Physical Exam Vitals:   10/02/23 1104  BP: 110/64  Pulse: 60  Temp: 98.2 F (36.8 C)  SpO2: 90%    BP Readings from Last 3 Encounters:  10/02/23 110/64  09/16/23 (!) 159/66  07/14/23 (!) 145/83   Wt Readings from Last 3 Encounters:  10/02/23 143 lb 6.4 oz (65 kg)  09/12/23 151 lb 10.8 oz (68.8 kg)  07/14/23 146 lb (66.2 kg)    Physical Exam Constitutional:      General: She is not in acute distress.    Appearance: Normal appearance.  HENT:     Head: Normocephalic.  Cardiovascular:     Rate and Rhythm: Normal rate and regular rhythm.     Heart sounds: Normal heart sounds.  Pulmonary:     Effort: Pulmonary effort is normal.     Comments: Congested throughout, minimal clear with coughing. Mild wheezing noted throughout Skin:    General: Skin is warm and dry.  Neurological:     General: No focal deficit present.     Mental Status: She is alert.  Psychiatric:        Mood and Affect: Mood normal.        Behavior: Behavior normal.    Assessment/Plan: Please see individual problem list.  Hospital discharge follow-up Assessment & Plan: Discharge summary, labs and imaging reviewed today. See individual plans below.    COPD, severe (HCC) Assessment & Plan: COPD has contributed to her symptoms. Pulmonology adjusted her inhalers and recommended pulmonary rehabilitation. Initiate pulmonary rehabilitation, encourage regular oxygen use, and continue the current inhaler regimen. Follow up with Pulmonology as scheduled.   Orders: -     AMB referral to pulmonary rehabilitation  Chronic respiratory failure with hypoxia and hypercapnia (HCC) Assessment & Plan: Symptoms are  attributed to COPD and fluid overload, but she is currently asymptomatic. Continue current medications as advised by cardiology. Initiate pulmonary rehabilitation and monitor weight daily, reporting any significant changes. Continue home oxygen. Follow up with Pulmonology and Cardiology as scheduled.   Orders: -     AMB referral to pulmonary rehabilitation  Controlled type 2 diabetes mellitus without complication, without long-term current use of insulin (HCC) Assessment & Plan: She has irregular blood glucose monitoring. POC A1c today in office- 6.9 indicating adequate glycemic control. Provide test strips for blood glucose monitoring and continue glimepiride 2 mg daily.   Orders: -     POCT glycosylated hemoglobin (Hb A1C) -     Blood Glucose Monitoring Suppl; 1 each by Does not apply route daily for 1 dose. May substitute to any manufacturer covered by patient's  insurance.  Dispense: 1 each; Refill: 0 -     Blood Glucose Test; 1 each by In Vitro route daily. May substitute to any manufacturer covered by patient's insurance.  Dispense: 100 strip; Refill: 2 -     Lancet Device; 1 each by Does not apply route daily. May substitute to any manufacturer covered by patient's insurance.  Dispense: 1 each; Refill: 0 -     Lancets Misc.; 1 each by Does not apply route daily. May substitute to any manufacturer covered by patient's insurance.  Dispense: 100 each; Refill: 3  Chronic diastolic CHF (congestive heart failure) (HCC) Assessment & Plan: Recent hospitalizations are attributed to COPD and fluid overload, but she is currently asymptomatic. Continue current medications as advised by cardiology. Follow up with Cardiology as scheduled and monitor weight daily, reporting any significant changes.   Seizure [facility]) Assessment & Plan: Episodes are identified as pseudo seizures, likely triggered by pain or respiratory distress. Maintain yearly neurology appointments. Continue Vimpat as prescribed.     On home oxygen therapy -     AMB referral to pulmonary rehabilitation    Return in about 3 months (around 01/02/2024) for Follow up.   Karen Dicker, [name] [facility]

## 2023-10-06 DIAGNOSIS — E119 Type 2 diabetes mellitus without complications: Secondary | ICD-10-CM | POA: Diagnosis not present

## 2023-10-06 DIAGNOSIS — Z7902 Long term (current) use of antithrombotics/antiplatelets: Secondary | ICD-10-CM | POA: Diagnosis not present

## 2023-10-06 DIAGNOSIS — Z7951 Long term (current) use of inhaled steroids: Secondary | ICD-10-CM | POA: Diagnosis not present

## 2023-10-06 DIAGNOSIS — G2581 Restless legs syndrome: Secondary | ICD-10-CM | POA: Diagnosis not present

## 2023-10-06 DIAGNOSIS — Z8673 Personal history of transient ischemic attack (TIA), and cerebral infarction without residual deficits: Secondary | ICD-10-CM | POA: Diagnosis not present

## 2023-10-06 DIAGNOSIS — E785 Hyperlipidemia, unspecified: Secondary | ICD-10-CM | POA: Diagnosis not present

## 2023-10-06 DIAGNOSIS — J441 Chronic obstructive pulmonary disease with (acute) exacerbation: Secondary | ICD-10-CM | POA: Diagnosis not present

## 2023-10-06 DIAGNOSIS — J439 Emphysema, unspecified: Secondary | ICD-10-CM | POA: Diagnosis not present

## 2023-10-06 DIAGNOSIS — I110 Hypertensive heart disease with heart failure: Secondary | ICD-10-CM | POA: Diagnosis not present

## 2023-10-06 DIAGNOSIS — K219 Gastro-esophageal reflux disease without esophagitis: Secondary | ICD-10-CM | POA: Diagnosis not present

## 2023-10-06 DIAGNOSIS — I2510 Atherosclerotic heart disease of native coronary artery without angina pectoris: Secondary | ICD-10-CM | POA: Diagnosis not present

## 2023-10-06 DIAGNOSIS — Z9981 Dependence on supplemental oxygen: Secondary | ICD-10-CM | POA: Diagnosis not present

## 2023-10-06 DIAGNOSIS — K7581 Nonalcoholic steatohepatitis (NASH): Secondary | ICD-10-CM | POA: Diagnosis not present

## 2023-10-06 DIAGNOSIS — I350 Nonrheumatic aortic (valve) stenosis: Secondary | ICD-10-CM | POA: Diagnosis not present

## 2023-10-06 DIAGNOSIS — J9611 Chronic respiratory failure with hypoxia: Secondary | ICD-10-CM | POA: Diagnosis not present

## 2023-10-06 DIAGNOSIS — K7469 Other cirrhosis of liver: Secondary | ICD-10-CM | POA: Diagnosis not present

## 2023-10-06 DIAGNOSIS — Z7984 Long term (current) use of oral hypoglycemic drugs: Secondary | ICD-10-CM | POA: Diagnosis not present

## 2023-10-06 DIAGNOSIS — J690 Pneumonitis due to inhalation of food and vomit: Secondary | ICD-10-CM | POA: Diagnosis not present

## 2023-10-06 DIAGNOSIS — I214 Non-ST elevation (NSTEMI) myocardial infarction: Secondary | ICD-10-CM | POA: Diagnosis not present

## 2023-10-06 DIAGNOSIS — I5033 Acute on chronic diastolic (congestive) heart failure: Secondary | ICD-10-CM | POA: Diagnosis not present

## 2023-10-06 DIAGNOSIS — Z720 Tobacco use: Secondary | ICD-10-CM | POA: Diagnosis not present

## 2023-10-06 DIAGNOSIS — E8809 Other disorders of plasma-protein metabolism, not elsewhere classified: Secondary | ICD-10-CM | POA: Diagnosis not present

## 2023-10-08 ENCOUNTER — Encounter: Payer: Self-pay | Admitting: Nurse Practitioner

## 2023-10-08 DIAGNOSIS — Z09 Encounter for follow-up examination after completed treatment for conditions other than malignant neoplasm: Secondary | ICD-10-CM | POA: Insufficient documentation

## 2023-10-08 NOTE — Assessment & Plan Note (Signed)
 She has irregular blood glucose monitoring. POC A1c today in office- 6.9 indicating adequate glycemic control. Provide test strips for blood glucose monitoring and continue glimepiride 2 mg daily.

## 2023-10-08 NOTE — Assessment & Plan Note (Signed)
 COPD has contributed to her symptoms. Pulmonology adjusted her inhalers and recommended pulmonary rehabilitation. Initiate pulmonary rehabilitation, encourage regular oxygen use, and continue the current inhaler regimen. Follow up with Pulmonology as scheduled.

## 2023-10-08 NOTE — Assessment & Plan Note (Signed)
 Episodes are identified as pseudo seizures, likely triggered by pain or respiratory distress. Maintain yearly neurology appointments. Continue Vimpat as prescribed.

## 2023-10-08 NOTE — Assessment & Plan Note (Signed)
 Discharge summary, labs and imaging reviewed today. See individual plans below.

## 2023-10-08 NOTE — Assessment & Plan Note (Addendum)
 Symptoms are attributed to COPD and fluid overload, but she is currently asymptomatic. Continue current medications as advised by cardiology. Initiate pulmonary rehabilitation and monitor weight daily, reporting any significant changes. Continue home oxygen. Follow up with Pulmonology and Cardiology as scheduled.

## 2023-10-08 NOTE — Assessment & Plan Note (Signed)
 Recent hospitalizations are attributed to COPD and fluid overload, but she is currently asymptomatic. Continue current medications as advised by cardiology. Follow up with Cardiology as scheduled and monitor weight daily, reporting any significant changes.

## 2023-10-12 ENCOUNTER — Telehealth: Payer: Self-pay

## 2023-10-12 NOTE — Telephone Encounter (Signed)
 [facility] home health orders received needing provider signature , forms placed in provider to be signed folder

## 2023-10-13 NOTE — Telephone Encounter (Signed)
 Form faxed to [phone] with a completed transmission log and form placed in red folder with charge sheet for front staff.

## 2023-10-14 DIAGNOSIS — J690 Pneumonitis due to inhalation of food and vomit: Secondary | ICD-10-CM | POA: Diagnosis not present

## 2023-10-14 DIAGNOSIS — J9611 Chronic respiratory failure with hypoxia: Secondary | ICD-10-CM | POA: Diagnosis not present

## 2023-10-14 DIAGNOSIS — I5033 Acute on chronic diastolic (congestive) heart failure: Secondary | ICD-10-CM | POA: Diagnosis not present

## 2023-10-14 DIAGNOSIS — E119 Type 2 diabetes mellitus without complications: Secondary | ICD-10-CM | POA: Diagnosis not present

## 2023-10-14 DIAGNOSIS — Z720 Tobacco use: Secondary | ICD-10-CM | POA: Diagnosis not present

## 2023-10-14 DIAGNOSIS — J441 Chronic obstructive pulmonary disease with (acute) exacerbation: Secondary | ICD-10-CM | POA: Diagnosis not present

## 2023-10-14 DIAGNOSIS — Z9981 Dependence on supplemental oxygen: Secondary | ICD-10-CM | POA: Diagnosis not present

## 2023-10-14 DIAGNOSIS — Z7902 Long term (current) use of antithrombotics/antiplatelets: Secondary | ICD-10-CM | POA: Diagnosis not present

## 2023-10-14 DIAGNOSIS — E785 Hyperlipidemia, unspecified: Secondary | ICD-10-CM | POA: Diagnosis not present

## 2023-10-14 DIAGNOSIS — I110 Hypertensive heart disease with heart failure: Secondary | ICD-10-CM | POA: Diagnosis not present

## 2023-10-14 DIAGNOSIS — Z8673 Personal history of transient ischemic attack (TIA), and cerebral infarction without residual deficits: Secondary | ICD-10-CM | POA: Diagnosis not present

## 2023-10-14 DIAGNOSIS — J439 Emphysema, unspecified: Secondary | ICD-10-CM | POA: Diagnosis not present

## 2023-10-14 DIAGNOSIS — G2581 Restless legs syndrome: Secondary | ICD-10-CM | POA: Diagnosis not present

## 2023-10-14 DIAGNOSIS — I214 Non-ST elevation (NSTEMI) myocardial infarction: Secondary | ICD-10-CM | POA: Diagnosis not present

## 2023-10-14 DIAGNOSIS — K7469 Other cirrhosis of liver: Secondary | ICD-10-CM | POA: Diagnosis not present

## 2023-10-14 DIAGNOSIS — E8809 Other disorders of plasma-protein metabolism, not elsewhere classified: Secondary | ICD-10-CM | POA: Diagnosis not present

## 2023-10-14 DIAGNOSIS — K219 Gastro-esophageal reflux disease without esophagitis: Secondary | ICD-10-CM | POA: Diagnosis not present

## 2023-10-14 DIAGNOSIS — I2510 Atherosclerotic heart disease of native coronary artery without angina pectoris: Secondary | ICD-10-CM | POA: Diagnosis not present

## 2023-10-14 DIAGNOSIS — Z7951 Long term (current) use of inhaled steroids: Secondary | ICD-10-CM | POA: Diagnosis not present

## 2023-10-14 DIAGNOSIS — I350 Nonrheumatic aortic (valve) stenosis: Secondary | ICD-10-CM | POA: Diagnosis not present

## 2023-10-14 DIAGNOSIS — Z7984 Long term (current) use of oral hypoglycemic drugs: Secondary | ICD-10-CM | POA: Diagnosis not present

## 2023-10-14 DIAGNOSIS — K7581 Nonalcoholic steatohepatitis (NASH): Secondary | ICD-10-CM | POA: Diagnosis not present

## 2023-10-19 DIAGNOSIS — G2581 Restless legs syndrome: Secondary | ICD-10-CM | POA: Diagnosis not present

## 2023-10-19 DIAGNOSIS — J439 Emphysema, unspecified: Secondary | ICD-10-CM | POA: Diagnosis not present

## 2023-10-19 DIAGNOSIS — Z7902 Long term (current) use of antithrombotics/antiplatelets: Secondary | ICD-10-CM | POA: Diagnosis not present

## 2023-10-19 DIAGNOSIS — I214 Non-ST elevation (NSTEMI) myocardial infarction: Secondary | ICD-10-CM | POA: Diagnosis not present

## 2023-10-19 DIAGNOSIS — E119 Type 2 diabetes mellitus without complications: Secondary | ICD-10-CM | POA: Diagnosis not present

## 2023-10-19 DIAGNOSIS — I5033 Acute on chronic diastolic (congestive) heart failure: Secondary | ICD-10-CM | POA: Diagnosis not present

## 2023-10-19 DIAGNOSIS — K7581 Nonalcoholic steatohepatitis (NASH): Secondary | ICD-10-CM | POA: Diagnosis not present

## 2023-10-19 DIAGNOSIS — E785 Hyperlipidemia, unspecified: Secondary | ICD-10-CM | POA: Diagnosis not present

## 2023-10-19 DIAGNOSIS — Z9981 Dependence on supplemental oxygen: Secondary | ICD-10-CM | POA: Diagnosis not present

## 2023-10-19 DIAGNOSIS — Z7984 Long term (current) use of oral hypoglycemic drugs: Secondary | ICD-10-CM | POA: Diagnosis not present

## 2023-10-19 DIAGNOSIS — I350 Nonrheumatic aortic (valve) stenosis: Secondary | ICD-10-CM | POA: Diagnosis not present

## 2023-10-19 DIAGNOSIS — Z8673 Personal history of transient ischemic attack (TIA), and cerebral infarction without residual deficits: Secondary | ICD-10-CM | POA: Diagnosis not present

## 2023-10-19 DIAGNOSIS — K219 Gastro-esophageal reflux disease without esophagitis: Secondary | ICD-10-CM | POA: Diagnosis not present

## 2023-10-19 DIAGNOSIS — J9611 Chronic respiratory failure with hypoxia: Secondary | ICD-10-CM | POA: Diagnosis not present

## 2023-10-19 DIAGNOSIS — Z720 Tobacco use: Secondary | ICD-10-CM | POA: Diagnosis not present

## 2023-10-19 DIAGNOSIS — I2510 Atherosclerotic heart disease of native coronary artery without angina pectoris: Secondary | ICD-10-CM | POA: Diagnosis not present

## 2023-10-19 DIAGNOSIS — I110 Hypertensive heart disease with heart failure: Secondary | ICD-10-CM | POA: Diagnosis not present

## 2023-10-19 DIAGNOSIS — J441 Chronic obstructive pulmonary disease with (acute) exacerbation: Secondary | ICD-10-CM | POA: Diagnosis not present

## 2023-10-19 DIAGNOSIS — Z7951 Long term (current) use of inhaled steroids: Secondary | ICD-10-CM | POA: Diagnosis not present

## 2023-10-19 DIAGNOSIS — J690 Pneumonitis due to inhalation of food and vomit: Secondary | ICD-10-CM | POA: Diagnosis not present

## 2023-10-19 DIAGNOSIS — K7469 Other cirrhosis of liver: Secondary | ICD-10-CM | POA: Diagnosis not present

## 2023-10-19 DIAGNOSIS — E8809 Other disorders of plasma-protein metabolism, not elsewhere classified: Secondary | ICD-10-CM | POA: Diagnosis not present

## 2023-11-16 ENCOUNTER — Other Ambulatory Visit: Payer: Self-pay | Admitting: Nurse Practitioner

## 2023-12-21 ENCOUNTER — Other Ambulatory Visit: Payer: Self-pay

## 2023-12-21 ENCOUNTER — Encounter: Payer: Self-pay | Admitting: Nurse Practitioner

## 2023-12-21 MED ORDER — PANTOPRAZOLE SODIUM 40 MG PO TBEC: 40.0000 mg | DELAYED_RELEASE_TABLET | Freq: Every day | ORAL | 3 refills | Status: DC

## 2023-12-22 DIAGNOSIS — M47812 Spondylosis without myelopathy or radiculopathy, cervical region: Secondary | ICD-10-CM | POA: Diagnosis not present

## 2023-12-22 DIAGNOSIS — R1012 Left upper quadrant pain: Secondary | ICD-10-CM | POA: Diagnosis not present

## 2023-12-22 DIAGNOSIS — R1011 Right upper quadrant pain: Secondary | ICD-10-CM | POA: Diagnosis not present

## 2023-12-22 DIAGNOSIS — M549 Dorsalgia, unspecified: Secondary | ICD-10-CM | POA: Diagnosis not present

## 2023-12-22 DIAGNOSIS — R000 Tachycardia, unspecified: Secondary | ICD-10-CM | POA: Diagnosis not present

## 2023-12-22 DIAGNOSIS — G2581 Restless legs syndrome: Secondary | ICD-10-CM | POA: Diagnosis not present

## 2023-12-22 DIAGNOSIS — I700 Atherosclerosis of aorta: Secondary | ICD-10-CM | POA: Diagnosis not present

## 2023-12-22 DIAGNOSIS — T465X5A Adverse effect of other antihypertensive drugs, initial encounter: Secondary | ICD-10-CM | POA: Diagnosis not present

## 2023-12-22 DIAGNOSIS — H9190 Unspecified hearing loss, unspecified ear: Secondary | ICD-10-CM | POA: Diagnosis not present

## 2023-12-22 DIAGNOSIS — I10 Essential (primary) hypertension: Secondary | ICD-10-CM | POA: Diagnosis not present

## 2023-12-22 DIAGNOSIS — R42 Dizziness and giddiness: Secondary | ICD-10-CM | POA: Diagnosis not present

## 2023-12-22 DIAGNOSIS — J441 Chronic obstructive pulmonary disease with (acute) exacerbation: Secondary | ICD-10-CM | POA: Diagnosis not present

## 2023-12-22 DIAGNOSIS — I6523 Occlusion and stenosis of bilateral carotid arteries: Secondary | ICD-10-CM | POA: Diagnosis not present

## 2023-12-22 DIAGNOSIS — E8809 Other disorders of plasma-protein metabolism, not elsewhere classified: Secondary | ICD-10-CM | POA: Diagnosis not present

## 2023-12-22 DIAGNOSIS — I959 Hypotension, unspecified: Secondary | ICD-10-CM | POA: Diagnosis not present

## 2023-12-22 DIAGNOSIS — R1312 Dysphagia, oropharyngeal phase: Secondary | ICD-10-CM | POA: Diagnosis not present

## 2023-12-22 DIAGNOSIS — T447X5A Adverse effect of beta-adrenoreceptor antagonists, initial encounter: Secondary | ICD-10-CM | POA: Diagnosis not present

## 2023-12-22 DIAGNOSIS — T426X6A Underdosing of other antiepileptic and sedative-hypnotic drugs, initial encounter: Secondary | ICD-10-CM | POA: Diagnosis not present

## 2023-12-22 DIAGNOSIS — I5032 Chronic diastolic (congestive) heart failure: Secondary | ICD-10-CM | POA: Diagnosis not present

## 2023-12-22 DIAGNOSIS — J439 Emphysema, unspecified: Secondary | ICD-10-CM | POA: Diagnosis not present

## 2023-12-22 DIAGNOSIS — R0902 Hypoxemia: Secondary | ICD-10-CM | POA: Diagnosis not present

## 2023-12-22 DIAGNOSIS — J9811 Atelectasis: Secondary | ICD-10-CM | POA: Diagnosis not present

## 2023-12-22 DIAGNOSIS — R569 Unspecified convulsions: Secondary | ICD-10-CM | POA: Diagnosis not present

## 2023-12-22 DIAGNOSIS — E785 Hyperlipidemia, unspecified: Secondary | ICD-10-CM | POA: Diagnosis not present

## 2023-12-22 DIAGNOSIS — J690 Pneumonitis due to inhalation of food and vomit: Secondary | ICD-10-CM | POA: Diagnosis not present

## 2023-12-22 DIAGNOSIS — M4802 Spinal stenosis, cervical region: Secondary | ICD-10-CM | POA: Diagnosis not present

## 2023-12-22 DIAGNOSIS — T501X5A Adverse effect of loop [high-ceiling] diuretics, initial encounter: Secondary | ICD-10-CM | POA: Diagnosis not present

## 2023-12-22 DIAGNOSIS — R0689 Other abnormalities of breathing: Secondary | ICD-10-CM | POA: Diagnosis not present

## 2023-12-22 DIAGNOSIS — I952 Hypotension due to drugs: Secondary | ICD-10-CM | POA: Diagnosis not present

## 2023-12-22 DIAGNOSIS — D649 Anemia, unspecified: Secondary | ICD-10-CM | POA: Diagnosis not present

## 2023-12-22 DIAGNOSIS — I110 Hypertensive heart disease with heart failure: Secondary | ICD-10-CM | POA: Diagnosis not present

## 2023-12-22 DIAGNOSIS — E869 Volume depletion, unspecified: Secondary | ICD-10-CM | POA: Diagnosis not present

## 2023-12-22 DIAGNOSIS — N179 Acute kidney failure, unspecified: Secondary | ICD-10-CM | POA: Diagnosis not present

## 2023-12-22 DIAGNOSIS — R918 Other nonspecific abnormal finding of lung field: Secondary | ICD-10-CM | POA: Diagnosis not present

## 2023-12-22 DIAGNOSIS — J9621 Acute and chronic respiratory failure with hypoxia: Secondary | ICD-10-CM | POA: Diagnosis not present

## 2023-12-22 DIAGNOSIS — Z91138 Patient's unintentional underdosing of medication regimen for other reason: Secondary | ICD-10-CM | POA: Diagnosis not present

## 2023-12-22 DIAGNOSIS — E119 Type 2 diabetes mellitus without complications: Secondary | ICD-10-CM | POA: Diagnosis not present

## 2023-12-22 DIAGNOSIS — I2510 Atherosclerotic heart disease of native coronary artery without angina pectoris: Secondary | ICD-10-CM | POA: Diagnosis not present

## 2023-12-28 ENCOUNTER — Telehealth: Payer: Self-pay | Admitting: *Deleted

## 2023-12-28 DIAGNOSIS — I10 Essential (primary) hypertension: Secondary | ICD-10-CM

## 2023-12-28 NOTE — Transitions of Care (Post Inpatient/ED Visit) (Signed)
 12/28/2023  Name: [name] MRN: [id number] DOB: [birth year]  Today's TOC FU Call Status: Today's TOC FU Call Status:: Successful TOC FU Call Completed TOC FU Call Complete Date: 12/28/23 Patient's Name and Date of Birth confirmed.  Transition Care Management Follow-up Telephone Call Date of Discharge: 12/26/23 Discharge Facility: Other [facility]) Name of Other ([facility]) Discharge Facility: [facility] Type of Discharge: Inpatient Admission Primary Inpatient Discharge Diagnosis:: Seizure-like activity How have you been since you were released from the hospital?: Better Any questions or concerns?: No  Items Reviewed: Did you receive and understand the discharge instructions provided?: Yes Medications obtained,verified, and reconciled?: Yes (Medications Reviewed) Any new allergies since your discharge?: No Dietary orders reviewed?: No Do you have support at home?: Yes People in Home [RPT]: child(ren), adult Name of Support/Comfort Primary Source: [place]  Medications Reviewed Today: Medications Reviewed Today     Reviewed by Kennieth Cathlean DEL, RN (Case Manager) on 12/28/23 at 1246  Med List Status: <None>   Medication Order Taking? Sig Documenting Provider Last Dose Status Informant  acyclovir  ointment (ZOVIRAX ) 5 % [id number] Yes Apply 1 Application topically 5 (five) times daily. X 4 days. Start at onset of symptoms. Gretel App, NP  Active Child, Pharmacy Records, Multiple Informants  Blood Glucose Monitoring Suppl DEVI [id number]  1 each by Does not apply route daily for 1 dose. May substitute to any manufacturer covered by patient's insurance. Gretel App, NP  Expired 10/03/23 2359   citalopram  (CELEXA ) 10 MG tablet [id number] Yes TAKE 1 TABLET BY MOUTH DAILY Gretel App, NP  Active Child, Pharmacy Records, Multiple Informants  clopidogrel  (PLAVIX ) 75 MG tablet [id number] Yes TAKE 1 TABLET BY MOUTH DAILY Lester, Kacy, NP  Active   ezetimibe (ZETIA) 10 MG tablet  [id number] Yes Take 1 tablet by mouth daily. [provider]  Active   fluticasone  (FLONASE ) 50 MCG/ACT nasal spray [id number]  Place 1 spray into both nostrils 2 (two) times daily. [provider]  Active Child, Pharmacy Records, Multiple Informants  furosemide  (LASIX ) 40 MG tablet [id number]  Take 1 tablet (40 mg total) by mouth daily. Gretel App, NP  Active Child, Pharmacy Records, Multiple Informants  gabapentin  (NEURONTIN ) 100 MG capsule [id number] Yes Take 2 capsules (200 mg total) by mouth at bedtime. Gretel App, NP  Active Child, Pharmacy Records, Multiple Informants  glimepiride  (AMARYL ) 2 MG tablet [id number] Yes TAKE 1 TABLET BY MOUTH DAILY FOR DIABETES Gretel App, NP  Active Child, Pharmacy Records, Multiple Informants  Glucose Blood (BLOOD GLUCOSE TEST STRIPS) STRP [id number] Yes 1 each by In Vitro route daily. May substitute to any manufacturer covered by patient's insurance. Gretel App, NP  Active   ipratropium (ATROVENT ) 0.02 % nebulizer solution [id number] Yes Take 2.5 mLs (0.5 mg total) by nebulization 4 (four) times daily. Arlander Lamar, MD  Active Child, Pharmacy Records, Multiple Informants  isosorbide  mononitrate (IMDUR ) 30 MG 24 hr tablet [id number] Yes Take 30 mg by mouth daily. [provider]  Active Child, Pharmacy Records, Multiple Informants  Lacosamide  (VIMPAT ) 150 MG TABS [id number] Yes Take 150 mg by mouth every 12 (twelve) hours. [provider]  Active Child, Pharmacy Records, Multiple Informants  Lancet Device MISC [id number]  1 each by Does not apply route daily. May substitute to any manufacturer covered by patient's insurance. Gretel App, NP  Expired 11/01/23 2359   Lancets Misc. MISC [id number]  1 each by Does not apply route daily. May substitute to any manufacturer covered by patient's insurance. Gretel App, NP  Expired 11/01/23 2359   losartan  (COZAAR ) 100 MG tablet [id number]  TAKE 1 TABLET BY MOUTH DAILY AS DIRECTED Gretel App, NP  Active Child, Pharmacy Records, Multiple Informants  metoprolol  succinate (TOPROL -XL) 25 MG 24 hr tablet [id number]  TAKE 1 TABLET BY MOUTH DAILY Gretel App, NP  Active Child, Pharmacy Records, Multiple Informants  montelukast  (SINGULAIR ) 10 MG tablet [id number] Yes TAKE 1 TABLET BY MOUTH AT BEDTIME Gretel App, NP  Active Child, Pharmacy Records, Multiple Informants  pantoprazole  (PROTONIX ) 40 MG tablet [id number] Yes Take 1 tablet (40 mg total) by mouth daily. Gretel App, NP  Active   potassium chloride  (KLOR-CON ) 10 MEQ tablet [id number] Yes TAKE 2 TABLETS BY MOUTH DAILY Gretel App, NP  Active Child, Pharmacy Records, Multiple Informants  rOPINIRole  (REQUIP ) 0.5 MG tablet [id number] Yes Take 1 tablet (0.5 mg total) by mouth at bedtime. Tobie Calix, MD  Active Child, Pharmacy Records, Multiple Informants  rosuvastatin  (CRESTOR ) 10 MG tablet [id number] Yes TAKE 1 TABLET BY MOUTH AT BEDTIME Gretel App, NP  Active Child, Pharmacy Records, Multiple Informants  spironolactone  (ALDACTONE ) 25 MG tablet [id number] Yes Take 1 tablet by mouth daily. [provider]  Active             Home Care and Equipment/Supplies: Were Home Health Services Ordered?: Yes Name of Home Health Agency::  (Patient didn't know/ He said they will call him) Has Agency set up a time to come to your home?: No EMR reviewed for Home Health Orders: Orders present/patient has not received call (refer to CM for follow-up) Any new equipment or medical supplies ordered?: NA  Functional Questionnaire: Do you need assistance with bathing/showering or dressing?: Yes (some one is there when she takes a bath) Do you need assistance with meal preparation?: Yes Do you need assistance with eating?: No Do you have difficulty maintaining continence: No Do you need assistance with getting out of bed/getting out of a chair/moving?: No Do you have difficulty managing or taking your medications?: Yes (pharmacy packages/  son reviews)  Follow up appointments reviewed: PCP Follow-up appointment confirmed?: No MD Provider Line Number:[phone] Given: Yes (son stated he will need to make appointment because he works and makes the appointments around his schedule) Specialist Hospital Follow-up appointment confirmed?: Yes Date of Specialist follow-up appointment?: 12/29/23 Follow-Up Specialty Provider:: Allyson Bloodgood Do you need transportation to your follow-up appointment?: No Do you understand care options if your condition(s) worsen?: Yes-patient verbalized understanding  SDOH Interventions Today    Flowsheet Row Most Recent Value  SDOH Interventions   Food Insecurity Interventions Intervention Not Indicated  Housing Interventions Intervention Not Indicated  Transportation Interventions Intervention Not Indicated, Patient Resources (Friends/Family)  Utilities Interventions Intervention Not Indicated   The patient son has been provided with contact information for the care management team and has been advised to call with any health-related questions or concerns. The patient verbalized understanding with current POC. The patient is directed to their insurance card regarding availability of benefits coverage  Cathlean Headland BSN RN [facility] Health Care Management Coordinator [email or web address] Direct Dial: [phone]  Fax: [phone] Website: [email or web address]

## 2023-12-29 DIAGNOSIS — R413 Other amnesia: Secondary | ICD-10-CM | POA: Diagnosis not present

## 2023-12-29 DIAGNOSIS — G2581 Restless legs syndrome: Secondary | ICD-10-CM | POA: Diagnosis not present

## 2023-12-29 DIAGNOSIS — G4733 Obstructive sleep apnea (adult) (pediatric): Secondary | ICD-10-CM | POA: Diagnosis not present

## 2024-01-13 ENCOUNTER — Ambulatory Visit: Admitting: Nurse Practitioner

## 2024-01-13 ENCOUNTER — Encounter: Payer: Self-pay | Admitting: Nurse Practitioner

## 2024-01-13 VITALS — BP 118/70 | HR 83 | Temp 98.1°F | Ht 63.0 in | Wt 143.4 lb

## 2024-01-13 DIAGNOSIS — F445 Conversion disorder with seizures or convulsions: Secondary | ICD-10-CM | POA: Diagnosis not present

## 2024-01-13 DIAGNOSIS — Z7984 Long term (current) use of oral hypoglycemic drugs: Secondary | ICD-10-CM

## 2024-01-13 DIAGNOSIS — R42 Dizziness and giddiness: Secondary | ICD-10-CM | POA: Diagnosis not present

## 2024-01-13 DIAGNOSIS — I10 Essential (primary) hypertension: Secondary | ICD-10-CM

## 2024-01-13 DIAGNOSIS — E876 Hypokalemia: Secondary | ICD-10-CM | POA: Diagnosis not present

## 2024-01-13 DIAGNOSIS — K219 Gastro-esophageal reflux disease without esophagitis: Secondary | ICD-10-CM

## 2024-01-13 DIAGNOSIS — G629 Polyneuropathy, unspecified: Secondary | ICD-10-CM | POA: Diagnosis not present

## 2024-01-13 DIAGNOSIS — F32A Depression, unspecified: Secondary | ICD-10-CM

## 2024-01-13 DIAGNOSIS — E785 Hyperlipidemia, unspecified: Secondary | ICD-10-CM

## 2024-01-13 DIAGNOSIS — E1169 Type 2 diabetes mellitus with other specified complication: Secondary | ICD-10-CM

## 2024-01-13 DIAGNOSIS — J449 Chronic obstructive pulmonary disease, unspecified: Secondary | ICD-10-CM | POA: Diagnosis not present

## 2024-01-13 DIAGNOSIS — Z8673 Personal history of transient ischemic attack (TIA), and cerebral infarction without residual deficits: Secondary | ICD-10-CM | POA: Diagnosis not present

## 2024-01-13 DIAGNOSIS — E119 Type 2 diabetes mellitus without complications: Secondary | ICD-10-CM

## 2024-01-13 DIAGNOSIS — Z09 Encounter for follow-up examination after completed treatment for conditions other than malignant neoplasm: Secondary | ICD-10-CM

## 2024-01-13 LAB — COMPREHENSIVE METABOLIC PANEL WITH GFR
ALT: 13 U/L (ref 0–35)
AST: 21 U/L (ref 0–37)
Albumin: 4.4 g/dL (ref 3.5–5.2)
Alkaline Phosphatase: 102 U/L (ref 39–117)
BUN: 10 mg/dL (ref 6–23)
CO2: 27 meq/L (ref 19–32)
Calcium: 9.7 mg/dL (ref 8.4–10.5)
Chloride: 105 meq/L (ref 96–112)
Creatinine, Ser: 0.82 mg/dL (ref 0.40–1.20)
GFR: 71.43 mL/min (ref >60.00)
Glucose, Bld: 59 mg/dL — ABNORMAL LOW (ref 70–99)
Potassium: 4.3 meq/L (ref 3.5–5.1)
Sodium: 141 meq/L (ref 135–145)
Total Bilirubin: 0.4 mg/dL (ref 0.2–1.2)
Total Protein: 7.5 g/dL (ref 6.0–8.3)

## 2024-01-13 LAB — CBC WITH DIFFERENTIAL/PLATELET
Basophils Absolute: 0 K/uL (ref 0.0–0.1)
Basophils Relative: 0.7 % (ref 0.0–3.0)
Eosinophils Absolute: 0.1 K/uL (ref 0.0–0.7)
Eosinophils Relative: 2.7 % (ref 0.0–5.0)
HCT: 40.8 % (ref 36.0–46.0)
Hemoglobin: 13.1 g/dL (ref 12.0–15.0)
Lymphocytes Relative: 33.9 % (ref 12.0–46.0)
Lymphs Abs: 1.7 K/uL (ref 0.7–4.0)
MCHC: 32 g/dL (ref 30.0–36.0)
MCV: 79.4 fl (ref 78.0–100.0)
Monocytes Absolute: 0.5 K/uL (ref 0.1–1.0)
Monocytes Relative: 9.6 % (ref 3.0–12.0)
Neutro Abs: 2.7 K/uL (ref 1.4–7.7)
Neutrophils Relative %: 53.1 % (ref 43.0–77.0)
Platelets: 254 K/uL (ref 150.0–400.0)
RBC: 5.14 Mil/uL — ABNORMAL HIGH (ref 3.87–5.11)
RDW: 15.6 % — ABNORMAL HIGH (ref 11.5–15.5)
WBC: 5.1 K/uL (ref 4.0–10.5)

## 2024-01-13 LAB — HEMOGLOBIN A1C: Hgb A1c MFr Bld: 6.7 % — ABNORMAL HIGH (ref 4.6–6.5)

## 2024-01-13 LAB — TSH: TSH: 1.62 u[IU]/mL (ref 0.35–5.50)

## 2024-01-13 NOTE — Progress Notes (Signed)
 Leron Glance, NP-C Phone: [phone]  [name] is a 73 y.o. female who presents today for hospital follow up.   Discussed the use of AI scribe software for clinical note transcription with the patient, who gave verbal consent to proceed.  History of Present Illness   [name] is a 73 year old female who presents for a hospital follow-up after experiencing low blood pressure and seizure-like episodes. ASL interpretor and son present who provide history.   [name] was hospitalized on June 17th due to low blood pressure and seizure-like episodes. Her seizures have been described as pseudo seizures, occurring primarily during times of distress or trauma, such as during her recent hospitalization. Neurology is in the process of tapering her off Vimpat . [name] is scheduled for a follow-up with neurology next month.  Since her hospital discharge, [name] feels dizzy, especially when getting up in the morning, and describes a sensation of being off balance, stating 'I walk like I'm drunk.' [name] attributes some of these symptoms to her current medications. [name] is not taking losartan , Lasix , or metoprolol , but is taking spironolactone  and Zetia. Zetia makes her feel unwell, but [name] has been taking it for a while. Her son recalls an episode where her blood pressure was extremely low, around 80/40, leading to her hospitalization.  [name] experiences occasional shortness of breath and mild chest pain, but no swelling. Dizziness is particularly noted in the morning, which [name] associates with her medication regimen. [name] does not check her blood sugar at home, but it was monitored during her hospital stay, where it was initially high due to hospital treatments but later stabilized.  [name] has not been using a home blood pressure cuff. [name] plans to switch her medications to a pharmacy closer to her new residence in [place].      Social History   Tobacco Use  Smoking Status Every Day   Current packs/day: 0.50    Average packs/day: 0.5 packs/day for 55.9 years (27.9 ttl pk-yrs)   Types: Cigarettes   Start date: 03/01/1968  Smokeless Tobacco Never    Current Outpatient Medications on File Prior to Visit  Medication Sig Dispense Refill   ezetimibe (ZETIA) 10 MG tablet Take 1 tablet by mouth daily.     fluticasone  (FLONASE ) 50 MCG/ACT nasal spray Place 1 spray into both nostrils 2 (two) times daily.     Glucose Blood (BLOOD GLUCOSE TEST STRIPS) STRP 1 each by In Vitro route daily. May substitute to any manufacturer covered by patient's insurance. 100 strip 2   ipratropium (ATROVENT ) 0.02 % nebulizer solution Take 2.5 mLs (0.5 mg total) by nebulization 4 (four) times daily. 75 mL 12   isosorbide  mononitrate (IMDUR ) 30 MG 24 hr tablet Take 30 mg by mouth daily.     Lacosamide  (VIMPAT ) 150 MG TABS Take 150 mg by mouth every 12 (twelve) hours.     rOPINIRole  (REQUIP ) 0.5 MG tablet Take 1 tablet (0.5 mg total) by mouth at bedtime. 30 tablet 0   spironolactone  (ALDACTONE ) 25 MG tablet Take 1 tablet by mouth daily.     furosemide  (LASIX ) 40 MG tablet Take 1 tablet (40 mg total) by mouth daily. (Patient not taking: Reported on 01/13/2024) 90 tablet 3   No current facility-administered medications on file prior to visit.     ROS see history of present illness  Objective  Physical Exam Vitals:   01/13/24 1053  BP: 118/70  Pulse: 83  Temp: 98.1 F (36.7 C)  SpO2: 92%  BP Readings from Last 3 Encounters:  01/13/24 118/70  10/02/23 110/64  09/16/23 (!) 159/66   Wt Readings from Last 3 Encounters:  01/13/24 143 lb 6.4 oz (65 kg)  10/02/23 143 lb 6.4 oz (65 kg)  09/12/23 151 lb 10.8 oz (68.8 kg)    Physical Exam Constitutional:      General: [name] is not in acute distress.    Appearance: Normal appearance.  HENT:     Head: Normocephalic.  Cardiovascular:     Rate and Rhythm: Normal rate and regular rhythm.     Heart sounds: Normal heart sounds.  Pulmonary:     Effort: Pulmonary effort is  normal.     Breath sounds: Normal breath sounds.  Skin:    General: Skin is warm and dry.  Neurological:     General: No focal deficit present.     Mental Status: [name] is alert.  Psychiatric:        Mood and Affect: Mood normal.        Behavior: Behavior normal.      Assessment/Plan: Please see individual problem list.  Dizziness Assessment & Plan: Dizziness and imbalance may be due to postural hypotension, hypoglycemia or medication changes. Check lab work today as outlined. Further work up pending. Follow up with Neurology.   Orders: -     CBC with Differential/[facility] discharge follow-up Assessment & Plan: Discharge summary, labs and imaging reviewed today. See individual plans below.    Essential hypertension Assessment & Plan: [name] was hospitalized for hypotension but is now stable at 118/70 mmHg. Postural hypotension may cause dizziness. Do not restart antihypertensive medications. Follow up with cardiology for blood pressure management. Advise obtaining an arm blood pressure cuff for home monitoring. Return precautions given to patient and family.   Orders: -     Comprehensive metabolic panel with GFR  Psychogenic nonepileptic seizure Assessment & Plan: Episodes are identified as pseudo-seizures triggered by stress. Vimpat  is being tapered as it is unnecessary. Follow up with neurology in one month for seizure management and continue tapering off Vimpat  as per neurology's guidance.   Controlled type 2 diabetes mellitus without complication, without long-term current use of insulin  (HCC) Assessment & Plan: Last A1c- 6.9. Controlled with Glimepiride  2 mg daily. Hypoglycemia could contribute to dizziness. Check A1c today. Continue current medication regimen. Encourage high protein, low carb diet and small snacks if needed.   Orders: -     Hemoglobin A1c -     Glimepiride ; Take 1 tablet (2 mg total) by mouth daily with breakfast.  Dispense: 90  tablet; Refill: 3  COPD, severe (HCC) Assessment & Plan: Continue current medication regimen per Pulmonology. Denies shortness of breath today. Encourage regular oxygen  use. Follow up as scheduled.   Orders: -     Montelukast  Sodium; Take 1 tablet (10 mg total) by mouth at bedtime.  Dispense: 90 tablet; Refill: 3  Hyperlipidemia associated with type 2 diabetes mellitus (HCC) Assessment & Plan: Managed with Crestor  daily. Continue. Refills sent.   Orders: -     Rosuvastatin  Calcium ; Take 1 tablet (10 mg total) by mouth at bedtime.  Dispense: 90 tablet; Refill: 3  Hypokalemia -     Potassium Chloride  ER; Take 2 tablets (20 mEq total) by mouth daily.  Dispense: 180 tablet; Refill: 3  History of CVA (cerebrovascular accident) -     Clopidogrel  Bisulfate; Take 1 tablet (75 mg total) by mouth daily.  Dispense: 90 tablet;  Refill: 3  Gastroesophageal reflux disease without esophagitis -     Pantoprazole  Sodium; Take 1 tablet (40 mg total) by mouth daily.  Dispense: 90 tablet; Refill: 3  Depression, unspecified depression type -     Citalopram  Hydrobromide; Take 1 tablet (10 mg total) by mouth daily.  Dispense: 90 tablet; Refill: 3  Neuropathy -     Gabapentin ; Take 2 capsules (200 mg total) by mouth at bedtime.  Dispense: 180 capsule; Refill: 3      Return in about 3 months (around 04/14/2024) for Follow up.   Leron Glance, NP-C [facility]

## 2024-01-15 ENCOUNTER — Ambulatory Visit: Payer: Self-pay | Admitting: Nurse Practitioner

## 2024-01-17 ENCOUNTER — Encounter: Payer: Self-pay | Admitting: Nurse Practitioner

## 2024-01-18 MED ORDER — ROSUVASTATIN CALCIUM 10 MG PO TABS: 10.0000 mg | ORAL_TABLET | Freq: Every day | ORAL | 3 refills | Status: AC

## 2024-01-18 MED ORDER — PANTOPRAZOLE SODIUM 40 MG PO TBEC: 40.0000 mg | DELAYED_RELEASE_TABLET | Freq: Every day | ORAL | 3 refills | Status: AC

## 2024-01-18 MED ORDER — GLIMEPIRIDE 2 MG PO TABS: 2.0000 mg | ORAL_TABLET | Freq: Every day | ORAL | 3 refills | Status: AC

## 2024-01-18 MED ORDER — CLOPIDOGREL BISULFATE 75 MG PO TABS
75.0000 mg | ORAL_TABLET | Freq: Every day | ORAL | 3 refills | Status: AC
Start: 2024-01-18 — End: ?

## 2024-01-18 MED ORDER — GABAPENTIN 100 MG PO CAPS: 200.0000 mg | ORAL_CAPSULE | Freq: Every day | ORAL | 3 refills | Status: AC

## 2024-01-18 MED ORDER — MONTELUKAST SODIUM 10 MG PO TABS: 10.0000 mg | ORAL_TABLET | Freq: Every day | ORAL | 3 refills | Status: AC

## 2024-01-18 MED ORDER — POTASSIUM CHLORIDE ER 10 MEQ PO TBCR: 20.0000 meq | EXTENDED_RELEASE_TABLET | Freq: Every day | ORAL | 3 refills | Status: AC

## 2024-01-18 MED ORDER — CITALOPRAM HYDROBROMIDE 10 MG PO TABS
10.0000 mg | ORAL_TABLET | Freq: Every day | ORAL | 3 refills | Status: AC
Start: 2024-01-18 — End: ?

## 2024-01-20 ENCOUNTER — Encounter: Payer: Self-pay | Admitting: Nurse Practitioner

## 2024-01-20 NOTE — Assessment & Plan Note (Signed)
 Episodes are identified as pseudo-seizures triggered by stress. Vimpat  is being tapered as it is unnecessary. Follow up with neurology in one month for seizure management and continue tapering off Vimpat  as per neurology's guidance.

## 2024-01-20 NOTE — Assessment & Plan Note (Signed)
 Managed with Crestor  daily. Continue. Refills sent.

## 2024-01-20 NOTE — Assessment & Plan Note (Addendum)
 She was hospitalized for hypotension but is now stable at 118/70 mmHg. Postural hypotension may cause dizziness. Do not restart antihypertensive medications. Follow up with cardiology for blood pressure management. Advise obtaining an arm blood pressure cuff for home monitoring. Return precautions given to patient and family.

## 2024-01-20 NOTE — Assessment & Plan Note (Signed)
 Continue current medication regimen per Pulmonology. Denies shortness of breath today. Encourage regular oxygen  use. Follow up as scheduled.

## 2024-01-20 NOTE — Assessment & Plan Note (Signed)
 Discharge summary, labs and imaging reviewed today. See individual plans below.

## 2024-01-20 NOTE — Assessment & Plan Note (Signed)
 Dizziness and imbalance may be due to postural hypotension, hypoglycemia or medication changes. Check lab work today as outlined. Further work up pending. Follow up with Neurology.

## 2024-01-20 NOTE — Assessment & Plan Note (Signed)
 Last A1c- 6.9. Controlled with Glimepiride  2 mg daily. Hypoglycemia could contribute to dizziness. Check A1c today. Continue current medication regimen. Encourage high protein, low carb diet and small snacks if needed.

## 2024-01-21 DIAGNOSIS — J449 Chronic obstructive pulmonary disease, unspecified: Secondary | ICD-10-CM | POA: Diagnosis not present

## 2024-01-21 DIAGNOSIS — Z9981 Dependence on supplemental oxygen: Secondary | ICD-10-CM | POA: Diagnosis not present

## 2024-01-21 DIAGNOSIS — R0609 Other forms of dyspnea: Secondary | ICD-10-CM | POA: Diagnosis not present

## 2024-01-21 DIAGNOSIS — F17210 Nicotine dependence, cigarettes, uncomplicated: Secondary | ICD-10-CM | POA: Diagnosis not present

## 2024-01-26 DIAGNOSIS — E782 Mixed hyperlipidemia: Secondary | ICD-10-CM | POA: Diagnosis not present

## 2024-01-26 DIAGNOSIS — H9193 Unspecified hearing loss, bilateral: Secondary | ICD-10-CM | POA: Diagnosis not present

## 2024-01-26 DIAGNOSIS — I10 Essential (primary) hypertension: Secondary | ICD-10-CM | POA: Diagnosis not present

## 2024-01-26 DIAGNOSIS — G4733 Obstructive sleep apnea (adult) (pediatric): Secondary | ICD-10-CM | POA: Diagnosis not present

## 2024-01-26 DIAGNOSIS — I2584 Coronary atherosclerosis due to calcified coronary lesion: Secondary | ICD-10-CM | POA: Diagnosis not present

## 2024-01-26 DIAGNOSIS — I5032 Chronic diastolic (congestive) heart failure: Secondary | ICD-10-CM | POA: Diagnosis not present

## 2024-01-26 DIAGNOSIS — R0789 Other chest pain: Secondary | ICD-10-CM | POA: Diagnosis not present

## 2024-01-26 DIAGNOSIS — I2510 Atherosclerotic heart disease of native coronary artery without angina pectoris: Secondary | ICD-10-CM | POA: Diagnosis not present

## 2024-01-26 DIAGNOSIS — R519 Headache, unspecified: Secondary | ICD-10-CM | POA: Diagnosis not present

## 2024-01-26 DIAGNOSIS — J449 Chronic obstructive pulmonary disease, unspecified: Secondary | ICD-10-CM | POA: Diagnosis not present

## 2024-02-13 DIAGNOSIS — B965 Pseudomonas (aeruginosa) (mallei) (pseudomallei) as the cause of diseases classified elsewhere: Secondary | ICD-10-CM | POA: Diagnosis not present

## 2024-02-13 DIAGNOSIS — I110 Hypertensive heart disease with heart failure: Secondary | ICD-10-CM | POA: Diagnosis not present

## 2024-02-13 DIAGNOSIS — Z880 Allergy status to penicillin: Secondary | ICD-10-CM | POA: Diagnosis not present

## 2024-02-13 DIAGNOSIS — J439 Emphysema, unspecified: Secondary | ICD-10-CM | POA: Diagnosis not present

## 2024-02-13 DIAGNOSIS — J9811 Atelectasis: Secondary | ICD-10-CM | POA: Diagnosis not present

## 2024-02-13 DIAGNOSIS — F17210 Nicotine dependence, cigarettes, uncomplicated: Secondary | ICD-10-CM | POA: Diagnosis not present

## 2024-02-13 DIAGNOSIS — R069 Unspecified abnormalities of breathing: Secondary | ICD-10-CM | POA: Diagnosis not present

## 2024-02-13 DIAGNOSIS — J4489 Other specified chronic obstructive pulmonary disease: Secondary | ICD-10-CM | POA: Diagnosis not present

## 2024-02-13 DIAGNOSIS — M069 Rheumatoid arthritis, unspecified: Secondary | ICD-10-CM | POA: Diagnosis not present

## 2024-02-13 DIAGNOSIS — W109XXA Fall (on) (from) unspecified stairs and steps, initial encounter: Secondary | ICD-10-CM | POA: Diagnosis not present

## 2024-02-13 DIAGNOSIS — R918 Other nonspecific abnormal finding of lung field: Secondary | ICD-10-CM | POA: Diagnosis not present

## 2024-02-13 DIAGNOSIS — E1142 Type 2 diabetes mellitus with diabetic polyneuropathy: Secondary | ICD-10-CM | POA: Diagnosis not present

## 2024-02-13 DIAGNOSIS — I5032 Chronic diastolic (congestive) heart failure: Secondary | ICD-10-CM | POA: Diagnosis not present

## 2024-02-13 DIAGNOSIS — J9601 Acute respiratory failure with hypoxia: Secondary | ICD-10-CM | POA: Diagnosis not present

## 2024-02-13 DIAGNOSIS — I2510 Atherosclerotic heart disease of native coronary artery without angina pectoris: Secondary | ICD-10-CM | POA: Diagnosis not present

## 2024-02-13 DIAGNOSIS — S6992XA Unspecified injury of left wrist, hand and finger(s), initial encounter: Secondary | ICD-10-CM | POA: Diagnosis not present

## 2024-02-13 DIAGNOSIS — K219 Gastro-esophageal reflux disease without esophagitis: Secondary | ICD-10-CM | POA: Diagnosis not present

## 2024-02-13 DIAGNOSIS — S3991XA Unspecified injury of abdomen, initial encounter: Secondary | ICD-10-CM | POA: Diagnosis not present

## 2024-02-13 DIAGNOSIS — R7989 Other specified abnormal findings of blood chemistry: Secondary | ICD-10-CM | POA: Diagnosis not present

## 2024-02-13 DIAGNOSIS — G2581 Restless legs syndrome: Secondary | ICD-10-CM | POA: Diagnosis not present

## 2024-02-13 DIAGNOSIS — Z96611 Presence of right artificial shoulder joint: Secondary | ICD-10-CM | POA: Diagnosis not present

## 2024-02-13 DIAGNOSIS — S3993XA Unspecified injury of pelvis, initial encounter: Secondary | ICD-10-CM | POA: Diagnosis not present

## 2024-02-13 DIAGNOSIS — J441 Chronic obstructive pulmonary disease with (acute) exacerbation: Secondary | ICD-10-CM | POA: Diagnosis not present

## 2024-02-13 DIAGNOSIS — I471 Supraventricular tachycardia, unspecified: Secondary | ICD-10-CM | POA: Diagnosis not present

## 2024-02-13 DIAGNOSIS — R0600 Dyspnea, unspecified: Secondary | ICD-10-CM | POA: Diagnosis not present

## 2024-02-13 DIAGNOSIS — Z886 Allergy status to analgesic agent status: Secondary | ICD-10-CM | POA: Diagnosis not present

## 2024-02-13 DIAGNOSIS — J432 Centrilobular emphysema: Secondary | ICD-10-CM | POA: Diagnosis not present

## 2024-02-13 DIAGNOSIS — R001 Bradycardia, unspecified: Secondary | ICD-10-CM | POA: Diagnosis not present

## 2024-02-13 DIAGNOSIS — J9621 Acute and chronic respiratory failure with hypoxia: Secondary | ICD-10-CM | POA: Diagnosis not present

## 2024-02-13 DIAGNOSIS — S20211A Contusion of right front wall of thorax, initial encounter: Secondary | ICD-10-CM | POA: Diagnosis not present

## 2024-02-13 DIAGNOSIS — H9190 Unspecified hearing loss, unspecified ear: Secondary | ICD-10-CM | POA: Diagnosis not present

## 2024-02-13 DIAGNOSIS — J440 Chronic obstructive pulmonary disease with acute lower respiratory infection: Secondary | ICD-10-CM | POA: Diagnosis not present

## 2024-02-13 DIAGNOSIS — G4730 Sleep apnea, unspecified: Secondary | ICD-10-CM | POA: Diagnosis not present

## 2024-02-13 DIAGNOSIS — E785 Hyperlipidemia, unspecified: Secondary | ICD-10-CM | POA: Diagnosis not present

## 2024-02-17 NOTE — Discharge Summary (Addendum)
 [facility] Discharge Summary  Admit Date: 02/13/2024 Discharge Date: 02/17/2024  Admitting Physician: Lorrene Levander Morn, MD Discharge Physician: DEREK CURLY PORTAL   Primary Care Provider: Gretel App, NP, Phone [phone]  Discharge Destination: Home with home health  Admission Diagnoses:  Acute dyspnea [R06.00]  Discharge Diagnoses:  Principal Problem:   Acute respiratory failure with hypoxia (CMS/HHS-HCC) Active Problems:   COPD (chronic obstructive pulmonary disease) (CMS/HHS-HCC)   CAP (community acquired pneumonia)   Fall   Acute on chronic respiratory failure with hypoxemia (CMS/HHS-HCC) Resolved Problems:   * No resolved hospital problems. *  Primary Diagnosis: Admitted for COPD exacerbation in setting of CAP  Changes Made (with rationale):  Restarted metoprolol  in setting of intermittent SVT  2 more doses of cefpodoxime to complete 5 day course for CAP Hold spironolactone  and imdur  until outpatient follow up with PCP  To-Do List (incidental findings, follow-up studies, etc.): Ensure following up with cards for consideration of further antiarrhythmic and holter monitor   Anticipatory Guidance for Outpatient Care:  If BP starts climbing could consider restarting her antihypertensives    Results Pending at Discharge:  Unresulted Labs (From admission, onward)     Start     Ordered   02/16/24 0500  Complete Blood Count (CBC)  Every 24 Hours,   Urgent     Question:  Release to patient  Answer:  Immediate  Start Priority Status  02/18/24 0500  Scheduled  02/19/24 0500  Scheduled  02/20/24 0500  Scheduled  02/21/24 0500  Scheduled  02/22/24 0500  Scheduled  02/23/24 0500  Scheduled     02/16/24 0115   02/16/24 0500  Magnesium   Every 24 Hours,   Urgent     Question:  Release to patient  Answer:  Immediate  Start Priority Status  02/18/24 0500  Scheduled  02/19/24 0500  Scheduled  02/20/24 0500  Scheduled  02/21/24 0500   Scheduled  02/22/24 0500  Scheduled  02/23/24 0500  Scheduled     02/16/24 0115   02/16/24 0500  Renal Function Panel (RFP)  Every 24 Hours,   Urgent     Question:  Release to patient  Answer:  Immediate  Start Priority Status  02/18/24 0500  Scheduled  02/19/24 0500  Scheduled  02/20/24 0500  Scheduled  02/21/24 0500  Scheduled  02/22/24 0500  Scheduled  02/23/24 0500  Scheduled     02/16/24 0115           Please see phone numbers at end of this summary for lab contact information.   Follow-up/Care Transition Plan: Sched. appts: Future Appointments  Date Time Provider Department Center  05/23/2024  9:00 AM Theotis Lavelle BRAVO, MD [facility] MARYL C  05/23/2024  9:45 AM Alluri, Keller Grist, MD [facility] MARYL BROCKS  01/25/2025 10:30 AM Maree Jannett Hering, MD [facility] MARYL BROCKS    Follow-up info: Gretel App, NP [address] [place] [address] [phone]  Schedule an appointment as soon as possible for a visit in 1 week(s) Our schedulers will call your clinic to schedule an appointment and call you. If you have not heard from them in 3 days, please call your clinic to schedule.  [place] - [place] [address] [place] [address]     [facility] [address] [place]  [address] [phone]        Allergies/Intolerances:  Allergies  Allergen Reactions  . Aspirin Unknown  . Carrot Hives  .  Celebrex [Celecoxib] Unknown  . Ciprofloxacin Unknown    (Cipro)   . Codeine Sulfate Unknown  . Fosphenytoin Itching and Other (See Comments)  . Fosphenytoin Sodium Other (See Comments)  . Levaquin  [Levofloxacin ] Unknown  . Lovastatin Unknown  . Pea (Pisum Sativum) Hives  . Penicillins Unknown    Penicillins Group   . Pravastatin Unknown  . Rice Hives  . Sulfa (Sulfonamide Antibiotics) Unknown  . Azithromycin  Rash     New Adverse Drug Events: none  Medications:     Current Discharge Medication List      PAUSE taking these medications      Instructions  isosorbide  mononitrate 30 MG ER tablet Wait to take this until your doctor or other care provider tells you to start again. Quantity: 30 tablet Refills: 0  Commonly known as: IMDUR  Take 1 tablet (30 mg total) by mouth once daily   spironolactone  25 MG tablet Wait to take this until your doctor or other care provider tells you to start again. Quantity: 30 tablet Refills: 5  Commonly known as: ALDACTONE  Take 1 tablet (25 mg total) by mouth once daily       START taking these medications      Instructions  acetaminophen  325 MG tablet Refills: 0 Stop taking on: February 27, 2024  Commonly known as: TYLENOL  Take 2 tablets (650 mg total) by mouth every 6 (six) hours as needed for up to 10 days Can obtain over the counter Last time this was given: 650 mg on February 17, 2024 12:38 AM   benzonatate  100 MG capsule Quantity: 20 capsule Refills: 0 Stop taking on: February 24, 2024  Commonly known as: TESSALON  Take 1 capsule (100 mg total) by mouth 3 (three) times daily as needed for Cough for up to 7 days Last time this was given: 100 mg on February 16, 2024 10:33 PM   cefpodoxime 200 MG tablet Quantity: 2 tablet Refills: 0 Stop taking on: February 18, 2024  Commonly known as: VANTIN Take 1 tablet (200 mg total) by mouth 2 (two) times daily for 2 doses   guaiFENesin  600 mg SR tablet Refills: 0 Stop taking on: February 27, 2024  Commonly known as: MUCINEX  Take 1 tablet (600 mg total) by mouth 2 (two) times daily for 10 days Can obtain over the counter Last time this was given: 600 mg on February 17, 2024  9:10 AM   lidocaine  4 % patch Refills: 0  Commonly known as: SALONPAS Place 1 patch onto the skin daily for 30 days Can obtain over the counter. Apply patch to the most painful area for up to 12 hours in a 24 hours period. Last time this was given: 1 patch on February 17, 2024 12:16 PM   metoprolol  SUCCinate 50 MG XL  tablet Quantity: 30 tablet Refills: 1 Start taking on: February 18, 2024  Commonly known as: TOPROL -XL Take 1 tablet (50 mg total) by mouth once daily Last time this was given: Ask your nurse or doctor       CONTINUE taking these medications      Instructions  citalopram  10 MG tablet Refills: 0  Commonly known as: CeleXA  Take 10 mg by mouth once daily Last time this was given: 10 mg on February 17, 2024  9:10 AM   clopidogreL  75 mg tablet Refills: 0  Commonly known as: PLAVIX  Take 75 mg by mouth once daily. Last time this was given: 75 mg on February 17, 2024  9:10 AM  empagliflozin 10 mg tablet Quantity: 30 tablet Refills: 5  Commonly known as: JARDIANCE Take 1 tablet (10 mg total) by mouth once daily Last time this was given: 10 mg on February 17, 2024  9:11 AM   ezetimibe 10 mg tablet Quantity: 30 tablet Refills: 5  Commonly known as: ZETIA Take 1 tablet (10 mg total) by mouth once daily Last time this was given: 10 mg on February 17, 2024  9:10 AM   gabapentin  100 MG capsule Refills: 0  Commonly known as: NEURONTIN  Take 200 mg by mouth at bedtime Last time this was given: 100 mg on February 16, 2024  9:42 PM   glimepiride  2 MG tablet Refills: 0  Commonly known as: AMARYL  Take 2 mg by mouth daily with breakfast Last time this was given: 2 mg on February 17, 2024 10:11 AM   ipratropium-albuteroL  nebulizer solution Quantity: 1080 mL Refills: 1  Commonly known as: DUO-NEB Take 3 mLs by nebulization 4 (four) times daily as needed for Wheezing Doctor's comments: Please file under Medicare Part B, Dx J44.9 Last time this was given: 3 mLs on February 17, 2024  8:42 AM   montelukast  10 mg tablet Quantity: 90 tablet Refills: 1  Commonly known as: SINGULAIR  Take 1 tablet (10 mg total) by mouth at bedtime Last time this was given: Ask your nurse or doctor   pantoprazole  40 MG DR tablet Refills: 0  Commonly known as: PROTONIX  Take 40 mg by mouth once daily Last time  this was given: 40 mg on February 17, 2024  9:10 AM   potassium chloride  10 MEQ ER tablet Refills: 0  Commonly known as: KLOR-CON  Take 10 mEq by mouth 2 (two) times daily   rosuvastatin  10 MG tablet Refills: 0  Commonly known as: CRESTOR  Take 10 mg by mouth once daily Last time this was given: 10 mg on February 17, 2024  9:09 AM   TRELEGY ELLIPTA 100-62.5-25 mcg inhaler Quantity: 60 each Refills: 2 Generic drug: fluticasone -umeclidinium-vilanterol  Inhale 1 Puff into the lungs once daily Last time this was given: 1 Puff on February 16, 2024  7:52 AM       STOP taking these medications    fluticasone  propion-salmeteroL 250-50 mcg/dose diskus inhaler Commonly known as: ADVAIR DISKUS         Brief History of Present Illness: HPI per Dr. Jamesetta Handing [name] is a 73 y.o. female with a previous medical history of HFpEF (grade 2 diastolic dysfunction), nonobstructive CAD (left heart cath 09/2023 with moderate coronary atherosclerosis primarily branch vessel disease, no severe proximal disease), mild aortic stenosis, hypertension, hyperlipidemia, diabetes, carotid artery stenosis, COPD-severe pulmonary emphysema (2L baseline requirement), prior stroke who presents after a recent fall with shortness of breath.   Per patient, she recently fell on Friday down her front steps. Left arm/hand pain, left knee bruising and R rib pain. About one day ago she developed worsening shortness of breath. Usually on 2L [place] at home. Can get around the house ok but shortness of breath limits her from leaving the house often. Lives with her son. Has a chronic productive cough. Produces white or yellow mucus. This has not changed over the past days. No fevers. Feels cold a lot which is usual for her. No sick contacts. Was seen by pulmonologist on 7/17.  Trelegy was ordered at that time, but she had not started taking yet.    In ED placed on 4L [place] satting 92%. She was in respiratory distress and  in tripod  position in bed. Placed on bi pap. Given dose of solumedrol. CT chest with increased secretions in the left mainstem bronchus and LLL bronchi. New secretions in left upper lobe. Concern for aspiration.  _____________________   Hospital Course by Problem:  #COPD Exacerbation #Community Acquired Pneumonia #Mucous Plugging #Acute on Chronic Hypoxemic Respiratory Failure Patient was admitted for acute respiratory failure in the setting of mucous plugging, initially needed Optiflow weaned down to 4 L oxygen  on transfer from MICU, subsequently weaned to 2L.  On baseline she is on 2 L oxygen .Worsening LLL opacity on imaging but patient able to use acapella and get OOB to chair to assist with secretion management. Sputum culture grew pseudomonas 1+, thought unlikely causative agent given normalized symptomatically with ceftriaxne -completed 5 day steroid burst, IV initially transitioned to PO -ceftriaxone  for abx for CAP - transitioned to oral on discharge to complete 5 days -continue scheduled duonebs, trelegy, montelukast , continue on discharge -prn albuterol  -outpatient pulmonology follow up as previously planned  #SVT -recurrent episodes, noted previously, thought likely worsened in setting of recent illness, notably metoprolol  had previously been discontinued in the setting of bradycardia -restarted here after cardiology consult, tolerated 50 mg metop succ well, walked with PT/OT well -placed 7 day holter, cardiology to arrange follow up -may need additional antiarrhythmic outpatient  # HFpEF  # CAD  # HTN # HLD  Metoprolol  as above Continued Plavix  Continued Zetia Continued Crestor  Resumed home SGLT2i BP low normal, held spironolactone , imdur  on discharge, can consider resuming outpatient  #Weakness -PT/OT evaluated - recommended HH PT/OT, arranged for home. SN to help with med management given poor health literacy, gets blister packs at home  #DMII -continued home  SGLT2i -continued home glimepiride  on discharge  #Anxiety -continue home celexa   #Peripheral Neuropathy -continue home gabapentin   #GERD -continue home pantoprazole     Surgeries and Procedures Performed:  None  _____________________  Discharge Exam:  BP (!) 150/70 (BP Location: Left upper arm, Patient Position: Sitting)   Pulse 73   Temp 36.9 C (98.5 F) (Oral)   Resp 23   Wt 64.1 kg (141 lb 5 oz)   SpO2 96%   BMI 25.03 kg/m  O2 Device: Nasal cannula (02/17/24 1431) GEN: well-appearing in nad, resting in bed HENT: [place]/AT, OP clear, mmm Eyes: EOMi, PERRL, anicteric sclerae NECK: supple, nl ROM  CV: Intermittently tachycardic, otherwise RRR no m/r/g, non elevated JVP, no LE edema PULM: Bilateral inspiratory squeaks, otherwise CTAB, no wheezes/rales/rhonchi or crackles, NWB on 2L ABDO: +BS, soft nt/nd w/o rebound or guarding.   EXTREM: MAEW, WWP, no c/c/e SKIN: warm and dry, no rashes or lesions noted NEURO: A&Ox4, CN II-XII grossly normal, 5/5 b/l UE/LE PSYCH: normal mood, pleasant affect, appropriate tone GU: no foley, no cva tenderness   Pertinent Lab Testing: Recent Labs  Lab 02/15/24 0256 02/16/24 0359 02/17/24 0349  NA 136 139 140  K 4.2 3.8 4.0  CL 104 109* 109*  CO2 20* 22 22  BUN 29* 21* 15  CREATININE 0.9 0.9 0.7  GLUCOSE 120 127 135  CALCIUM  9.3 8.7 8.7   Recent Labs  Lab 02/13/24 1603  AST 18  ALT 13  ALKPHOS 105  TBILI 0.6    Recent Labs  Lab 02/15/24 0256 02/16/24 0359 02/17/24 0349  WBC 15.6* 12.9* 8.1  HGB 12.1 11.6* 10.6*  HCT 37.9 36.9 34.1*  PLT 283 258 237   Recent Labs  Lab 02/13/24 1603  APTT 29.7  INR 1.0  Other Pertinent Labs:  Legionella, strep pneumo negative  Micro:  Susceptibility data from last 90 days. Collected Specimen Info Organism  02/15/24 Other from Sputum Expectorated Pseudomonas aeruginosa       Pertinent Imaging:   CT chest abdomen pelvis with contrast w MIPS Result Date:  02/15/2024 Procedure: CT Chest with IV Contrast Procedure: CT Abdomen and Pelvis with IV Contrast Comparison:  CTA chest 12/22/2023 Indication:  eval for fall RL chest wall pain and bruising, R06.00 Dyspnea, unspecified Technique:  CT imaging was performed of the chest, abdomen, and pelvis following the administration of intravenous contrast.  Iodinated contrast was used due to the indications for the examination, to improve disease detection and to further define anatomy.   3-D maximal intensity projection (MIP) reconstructions of the chest were performed to potentially increase study sensitivity. Coronal and sagittal images were also generated and reviewed. Findings: Chest:  - Chest wall and Thoracic Inlet: No masses or lymphadenopathy. Postsurgical change from right shoulder arthroplasty. - Mediastinum and Karen: Prominent but not pathologically enlarged mediastinal and hilar lymph nodes which may be reactive in etiology. There is no mediastinal hematoma. - Thoracic Vessels: Normal caliber of the thoracic aorta and main pulmonary artery. No definite evidence of acute traumatic aortic injury. Mild atherosclerotic plaque. - Heart and Pericardium: Normal heart size.  No pericardial effusion. Moderate calcified atherosclerotic plaque in the coronary arteries. - Lungs and Airways: Trachea is patent. Endoluminal debris or mucus plugging within the left mainstem bronchus extending into the lingular and lower lobe segmental branches. Stable consolidative opacity of the right lower lobe which may be seen in the setting of post obstructive atelectasis or pneumonia. - Pleura: No hemothorax or pneumothorax. Abdomen and pelvis: - Liver: Normal in morphology and enhancement.  Indeterminate hypodense lesion within the right hepatic lobe which measures 1.6 x 1.3 cm (series 3 #84). The portal and hepatic veins are patent. - Biliary and Gallbladder: No intrahepatic or extrahepatic bile duct dilatation. The gallbladder is surgically  absent. - Spleen: Normal in appearance.  - Pancreas: Normal in appearance. - Adrenal Glands: Normal in appearance. - Kidneys: Symmetric in size and enhancement. No suspicious renal lesions. No renal calculi. No hydronephrosis. - Abdominal and Pelvic Vasculature: No abdominal aortic aneurysm. Moderate atherosclerotic plaque. - Gastrointestinal Tract: Stomach and small bowel is normal in caliber. Small volume stool burden within the colon. Pancolonic mucosal hyperenhancement and bowel wall thickening extending from the cecum to the rectum, findings which may be seen in the setting of infectious/inflammatory colitis. Appendix is non-visualized.  - Peritoneum/Mesentery/Retroperitoneum: No free fluid.  No free intraperitoneal air. - Lymph Nodes: No retroperitoneal or mesenteric lymphadenopathy.  - Bladder: Normal in appearance. - Pelvic Organs: Anteverted anteflexed uterus. No adnexal mass. - Body Wall: Unremarkable. - Musculoskeletal:  No aggressive appearing osseous lesions. Mild spondylotic changes of the thoracolumbar spine. Impression: 1.  No acute traumatic injury within the chest, abdomen, or pelvis. 2.  Intraluminal debris versus mucous plugging within the left mainstem bronchus extending into the lingular and left lower lobe bronchi. Consolidative opacity of the left lower lobe is suspicious for obstructive atelectasis and/or pneumonia. 3.  Pancolonic mucosal hyperenhancement and colonic wall thickening which may be seen in infectious/inflammatory colitis. 4.  Incidental hypodense right hepatic lobe lesion measuring 1.6 cm in maximum diameter which is not fully characterized on this examination. Recommend further characterization with contrast-enhanced MR abdomen on a nonemergent outpatient basis. This finding has been placed in the unexpected finding folder for future follow-up. The preliminary report (  critical or emergent communication) was reviewed prior to this dictation and there are no critical differences  between the preliminary results and the impressions in this final report. Electronically Reviewed by:  Toribio Childs, MD, [facility] Electronically Reviewed on:  02/14/2024 9:21 AM I have reviewed the images and concur with the above findings. Electronically Signed by:  Lauraine Ned, MD, [facility] Electronically Signed on:  02/15/2024 8:50 AM  X-ray chest single view portable Result Date: 02/15/2024 Exam: XR CHEST SINGLE VIEW PORTABLE Indication:Lung Aeration, J96.21 Acute and chronic respiratory failure with hypoxia (CMS/HHS-HCC) Comparison: CT chest, abdomen and pelvis dated February 13, 2024 Findings/impression: Cardiac and mediastinal contours are unchanged. Increased left lower lobe opacity, likely worsened left lower lobe collapse related to mucous plugging seen on prior CT. No large pleural effusion. No evidence of pneumothorax. Electronically Signed by:  Rea Marc, MD, [facility] Electronically Signed on:  02/15/2024 8:17 AM  X-ray hand left minimum 3 views Result Date: 02/13/2024 XR WRIST LEFT 3 VIEWS, XR HAND LEFT 3 VIEWS Indication: FALL, SHORTNESS OF BREATH, R06.00 Dyspnea, unspecified Comparison: None Findings/Impression: No acute fractures or dislocation of the left wrist or hand. Overlying contrast extravasation on the skin. Electronically Signed by:  Carliss Miyamoto, MD, [facility] Electronically Signed on:  02/13/2024 7:49 PM  X-ray wrist left 3 plus views Result Date: 02/13/2024 XR WRIST LEFT 3 VIEWS, XR HAND LEFT 3 VIEWS Indication: FALL, SHORTNESS OF BREATH, R06.00 Dyspnea, unspecified Comparison: None Findings/Impression: No acute fractures or dislocation of the left wrist or hand. Overlying contrast extravasation on the skin. Electronically Signed by:  Carliss Miyamoto, MD, [facility] Electronically Signed on:  02/13/2024 7:49 PM  X-ray chest single view portable Result Date: 02/13/2024 XR CHEST SINGLE VIEW PORTABLE INDICATION: Lung Aeration, R06.00 Dyspnea, unspecified  COMPARISON: Chest radiograph 12/22/2023 FINDINGS/ IMPRESSION: 1.  Mediastinal and cardiac contours are within normal limits. 2.  Left lower lobe atelectasis. No focal consolidation. 3.  No pleural effusion. No pneumothorax. 4.  Right shoulder arthroplasty. Electronically Reviewed by:  Larri Quaker, MD, [facility] Electronically Reviewed on:  02/13/2024 5:07 PM I have reviewed the images and concur with the above findings. Electronically Signed by:  Carliss Miyamoto, MD, [facility] Electronically Signed on:  02/13/2024 5:54 PM  X-ray pelvis 1 to 2 views portable Result Date: 02/13/2024 XR PELVIS 1 VIEWS PORTABLE Indication: Trauma, R06.00 Dyspnea, unspecified Comparison: None Findings/Impression: No acute fractures or dislocation. Electronically Signed by:  Carliss Miyamoto, MD, [facility] Electronically Signed on:  02/13/2024 5:14 PM  _____________________  Code Status: Full Code Goals of care were not addressed during this admission.   Status on Discharge:  Current activity: Walks occasionally (02/17/24 0800) Current mobility: No limitation (02/17/24 0800)  Activity Recommendation: activity as tolerated  Other Discharge Instructions: Services setup at discharge: Home Health PT/OT and Home Health Nurse Tubes/lines at discharge: [facility] Diet: Diet regular  Wound Care Order Instructions     None       _____________________  Time spent on discharge process: 60 minutes    DEREK CURLY PORTAL, MD [facility]  02/17/2024   Hospital Contact Information:  [facility]) [facility]) [facility])  Pending tests:  Laboratory: [phone] Microbiology: [phone] Pathology: [phone] Radiology: [phone]  General questions: [phone] Pending tests: Laboratory: [phone] Microbiology: [phone] Pathology: [phone] Radiology: [phone]  General questions:  [phone] Pending tests:  Laboratory:  [phone] Microbiology: [phone] Pathology: [phone] Radiology: [phone]  General questions:  [phone]

## 2024-02-18 ENCOUNTER — Telehealth: Payer: Self-pay

## 2024-02-18 NOTE — Transitions of Care (Post Inpatient/ED Visit) (Signed)
   02/18/2024  Name: [name] MRN: [id number] DOB: 19-Dec-1950  Today's TOC FU Call Status: Today's TOC FU Call Status:: Successful TOC FU Call Completed TOC FU Call Complete Date: 02/18/24 [facility] RN  spoke with patient's son who states patient lives with him, she has pills in pill packs from pharmacy and he's already contacted pharmacy. Son denies any needs and while appreciates the call, does not feel there is a current need.) Patient's Name and Date of Birth confirmed.  Transition Care Management Follow-up Telephone Call Date of Discharge: 02/17/24 Discharge Facility: Other ([facility]) Name of Other ([facility]) Discharge Facility: [facility] Type of Discharge: Inpatient Admission Primary Inpatient Discharge Diagnosis:: Acute respiratory failure with hypoxia  Shona Prow RN, CCM [place]  VBCI-Population Health RN Care Manager [phone]

## 2024-02-19 ENCOUNTER — Telehealth: Payer: Self-pay

## 2024-02-19 DIAGNOSIS — H9190 Unspecified hearing loss, unspecified ear: Secondary | ICD-10-CM | POA: Diagnosis not present

## 2024-02-19 DIAGNOSIS — I110 Hypertensive heart disease with heart failure: Secondary | ICD-10-CM | POA: Diagnosis not present

## 2024-02-19 DIAGNOSIS — I5030 Unspecified diastolic (congestive) heart failure: Secondary | ICD-10-CM | POA: Diagnosis not present

## 2024-02-19 DIAGNOSIS — I471 Supraventricular tachycardia, unspecified: Secondary | ICD-10-CM | POA: Diagnosis not present

## 2024-02-19 DIAGNOSIS — K589 Irritable bowel syndrome without diarrhea: Secondary | ICD-10-CM | POA: Diagnosis not present

## 2024-02-19 DIAGNOSIS — R531 Weakness: Secondary | ICD-10-CM | POA: Diagnosis not present

## 2024-02-19 DIAGNOSIS — I2510 Atherosclerotic heart disease of native coronary artery without angina pectoris: Secondary | ICD-10-CM | POA: Diagnosis not present

## 2024-02-19 DIAGNOSIS — K760 Fatty (change of) liver, not elsewhere classified: Secondary | ICD-10-CM | POA: Diagnosis not present

## 2024-02-19 DIAGNOSIS — J439 Emphysema, unspecified: Secondary | ICD-10-CM | POA: Diagnosis not present

## 2024-02-19 DIAGNOSIS — Z9981 Dependence on supplemental oxygen: Secondary | ICD-10-CM | POA: Diagnosis not present

## 2024-02-19 DIAGNOSIS — Z7984 Long term (current) use of oral hypoglycemic drugs: Secondary | ICD-10-CM | POA: Diagnosis not present

## 2024-02-19 DIAGNOSIS — E785 Hyperlipidemia, unspecified: Secondary | ICD-10-CM | POA: Diagnosis not present

## 2024-02-19 DIAGNOSIS — M069 Rheumatoid arthritis, unspecified: Secondary | ICD-10-CM | POA: Diagnosis not present

## 2024-02-19 DIAGNOSIS — E119 Type 2 diabetes mellitus without complications: Secondary | ICD-10-CM | POA: Diagnosis not present

## 2024-02-19 DIAGNOSIS — M1990 Unspecified osteoarthritis, unspecified site: Secondary | ICD-10-CM | POA: Diagnosis not present

## 2024-02-19 DIAGNOSIS — Z9181 History of falling: Secondary | ICD-10-CM | POA: Diagnosis not present

## 2024-02-19 DIAGNOSIS — K219 Gastro-esophageal reflux disease without esophagitis: Secondary | ICD-10-CM | POA: Diagnosis not present

## 2024-02-19 DIAGNOSIS — G4730 Sleep apnea, unspecified: Secondary | ICD-10-CM | POA: Diagnosis not present

## 2024-02-19 DIAGNOSIS — Z7902 Long term (current) use of antithrombotics/antiplatelets: Secondary | ICD-10-CM | POA: Diagnosis not present

## 2024-02-19 DIAGNOSIS — Z7951 Long term (current) use of inhaled steroids: Secondary | ICD-10-CM | POA: Diagnosis not present

## 2024-02-19 DIAGNOSIS — I060 Rheumatic aortic stenosis: Secondary | ICD-10-CM | POA: Diagnosis not present

## 2024-02-19 DIAGNOSIS — J441 Chronic obstructive pulmonary disease with (acute) exacerbation: Secondary | ICD-10-CM | POA: Diagnosis not present

## 2024-02-19 NOTE — Telephone Encounter (Signed)
 Copied from CRM #[id number]. Topic: Clinical - Home Health Verbal Orders >> Feb 19, 2024  2:09 PM Deleta RAMAN wrote: Caller/Agency: [facility] Callback Number: [phone] Service Requested: Skilled Nursing Frequency: 1 week 2 starting 8/18 Any new concerns about the patient? No

## 2024-02-22 ENCOUNTER — Telehealth: Payer: Self-pay

## 2024-02-22 NOTE — Telephone Encounter (Signed)
 Home health form received wanting to confirm meds form placed in provider to be signed folder

## 2024-02-23 ENCOUNTER — Encounter: Payer: Self-pay | Admitting: Nurse Practitioner

## 2024-02-23 ENCOUNTER — Ambulatory Visit (INDEPENDENT_AMBULATORY_CARE_PROVIDER_SITE_OTHER): Admitting: Nurse Practitioner

## 2024-02-23 VITALS — BP 107/66 | HR 54 | Temp 98.0°F | Ht 63.0 in | Wt 147.8 lb

## 2024-02-23 DIAGNOSIS — J439 Emphysema, unspecified: Secondary | ICD-10-CM | POA: Diagnosis not present

## 2024-02-23 DIAGNOSIS — H9190 Unspecified hearing loss, unspecified ear: Secondary | ICD-10-CM | POA: Diagnosis not present

## 2024-02-23 DIAGNOSIS — Z7984 Long term (current) use of oral hypoglycemic drugs: Secondary | ICD-10-CM | POA: Diagnosis not present

## 2024-02-23 DIAGNOSIS — Z7902 Long term (current) use of antithrombotics/antiplatelets: Secondary | ICD-10-CM | POA: Diagnosis not present

## 2024-02-23 DIAGNOSIS — I471 Supraventricular tachycardia, unspecified: Secondary | ICD-10-CM

## 2024-02-23 DIAGNOSIS — Z720 Tobacco use: Secondary | ICD-10-CM | POA: Diagnosis not present

## 2024-02-23 DIAGNOSIS — G4730 Sleep apnea, unspecified: Secondary | ICD-10-CM | POA: Diagnosis not present

## 2024-02-23 DIAGNOSIS — Z9181 History of falling: Secondary | ICD-10-CM | POA: Diagnosis not present

## 2024-02-23 DIAGNOSIS — K589 Irritable bowel syndrome without diarrhea: Secondary | ICD-10-CM | POA: Diagnosis not present

## 2024-02-23 DIAGNOSIS — J441 Chronic obstructive pulmonary disease with (acute) exacerbation: Secondary | ICD-10-CM

## 2024-02-23 DIAGNOSIS — R569 Unspecified convulsions: Secondary | ICD-10-CM | POA: Diagnosis not present

## 2024-02-23 DIAGNOSIS — M1990 Unspecified osteoarthritis, unspecified site: Secondary | ICD-10-CM | POA: Diagnosis not present

## 2024-02-23 DIAGNOSIS — I110 Hypertensive heart disease with heart failure: Secondary | ICD-10-CM | POA: Diagnosis not present

## 2024-02-23 DIAGNOSIS — I5030 Unspecified diastolic (congestive) heart failure: Secondary | ICD-10-CM | POA: Diagnosis not present

## 2024-02-23 DIAGNOSIS — E785 Hyperlipidemia, unspecified: Secondary | ICD-10-CM | POA: Diagnosis not present

## 2024-02-23 DIAGNOSIS — Z7951 Long term (current) use of inhaled steroids: Secondary | ICD-10-CM | POA: Diagnosis not present

## 2024-02-23 DIAGNOSIS — I2510 Atherosclerotic heart disease of native coronary artery without angina pectoris: Secondary | ICD-10-CM | POA: Diagnosis not present

## 2024-02-23 DIAGNOSIS — Z9981 Dependence on supplemental oxygen: Secondary | ICD-10-CM | POA: Diagnosis not present

## 2024-02-23 DIAGNOSIS — I060 Rheumatic aortic stenosis: Secondary | ICD-10-CM | POA: Diagnosis not present

## 2024-02-23 DIAGNOSIS — K219 Gastro-esophageal reflux disease without esophagitis: Secondary | ICD-10-CM | POA: Diagnosis not present

## 2024-02-23 DIAGNOSIS — K760 Fatty (change of) liver, not elsewhere classified: Secondary | ICD-10-CM | POA: Diagnosis not present

## 2024-02-23 DIAGNOSIS — R531 Weakness: Secondary | ICD-10-CM | POA: Diagnosis not present

## 2024-02-23 DIAGNOSIS — E119 Type 2 diabetes mellitus without complications: Secondary | ICD-10-CM | POA: Diagnosis not present

## 2024-02-23 DIAGNOSIS — M069 Rheumatoid arthritis, unspecified: Secondary | ICD-10-CM | POA: Diagnosis not present

## 2024-02-23 NOTE — Progress Notes (Signed)
 Karen Glance, NP-C Phone: [phone]  [name] is a 73 y.o. female who presents today for hospital follow up.   Discussed the use of AI scribe software for clinical note transcription with the patient, who gave verbal consent to proceed.  History of Present Illness   [name] is a 73 year old female with COPD who presents with shortness of breath following a recent hospitalization for COPD exacerbation.  She was recently hospitalized for a COPD exacerbation after a fall at home approximately ten days ago. Initially, she experienced soreness and bruising on her side, arm, and leg. By Saturday, she noticed increased soreness and a bulging bruise on her side, prompting a visit to the ER. Imaging ruled out rib fractures or lung puncture, but revealed significant fluid in her left lung.  She reports ongoing soreness in her side, particularly when moving or coughing. She is currently receiving home health services, including physical therapy, occupational therapy, and nursing, to regain strength. She notes a reduction in shortness of breath compared to her baseline, which is typically characterized by some degree of breathlessness.  Her current medications include Trelegy. During her hospital stay, her Imdur  and spironolactone  were discontinued, and metoprolol  was restarted after episodes of supraventricular tachycardia (SVT) were detected while she was on a heart monitor. She has been wearing the heart monitor for over a week and is in the process of returning it.  She has a history of pseudo seizures and was previously on seizure medication, which was discontinued by her neurologist as it was deemed unnecessary for her condition. She inquires about her ability to drive due to her history of pseudo seizures and recent discontinuation of seizure medication.  She is attempting to quit smoking and has significantly reduced her cigarette consumption since her hospital discharge. She has  tried nicotine  patches in the past as a cessation aid.      Social History   Tobacco Use  Smoking Status Every Day   Current packs/day: 0.50   Average packs/day: 0.5 packs/day for 56.0 years (28.0 ttl pk-yrs)   Types: Cigarettes   Start date: 03/01/1968  Smokeless Tobacco Never    Current Outpatient Medications on File Prior to Visit  Medication Sig Dispense Refill   citalopram  (CELEXA ) 10 MG tablet Take 1 tablet (10 mg total) by mouth daily. 90 tablet 3   clopidogrel  (PLAVIX ) 75 MG tablet Take 1 tablet (75 mg total) by mouth daily. 90 tablet 3   ezetimibe (ZETIA) 10 MG tablet Take 1 tablet by mouth daily.     fluticasone  (FLONASE ) 50 MCG/ACT nasal spray Place 1 spray into both nostrils 2 (two) times daily.     gabapentin  (NEURONTIN ) 100 MG capsule Take 2 capsules (200 mg total) by mouth at bedtime. 180 capsule 3   glimepiride  (AMARYL ) 2 MG tablet Take 1 tablet (2 mg total) by mouth daily with breakfast. 90 tablet 3   Glucose Blood (BLOOD GLUCOSE TEST STRIPS) STRP 1 each by In Vitro route daily. May substitute to any manufacturer covered by patient's insurance. 100 strip 2   ipratropium (ATROVENT ) 0.02 % nebulizer solution Take 2.5 mLs (0.5 mg total) by nebulization 4 (four) times daily. 75 mL 12   isosorbide  mononitrate (IMDUR ) 30 MG 24 hr tablet Take 30 mg by mouth daily.     metoprolol  succinate (TOPROL -XL) 50 MG 24 hr tablet Take 50 mg by mouth daily.     montelukast  (SINGULAIR ) 10 MG tablet Take 1 tablet (10 mg total) by  mouth at bedtime. 90 tablet 3   pantoprazole  (PROTONIX ) 40 MG tablet Take 1 tablet (40 mg total) by mouth daily. 90 tablet 3   potassium chloride  (KLOR-CON ) 10 MEQ tablet Take 2 tablets (20 mEq total) by mouth daily. 180 tablet 3   rOPINIRole  (REQUIP ) 0.5 MG tablet Take 1 tablet (0.5 mg total) by mouth at bedtime. 30 tablet 0   rosuvastatin  (CRESTOR ) 10 MG tablet Take 1 tablet (10 mg total) by mouth at bedtime. 90 tablet 3   spironolactone  (ALDACTONE ) 25 MG tablet  Take 1 tablet by mouth daily.     furosemide  (LASIX ) 40 MG tablet Take 1 tablet (40 mg total) by mouth daily. (Patient not taking: Reported on 02/23/2024) 90 tablet 3   Lacosamide  (VIMPAT ) 150 MG TABS Take 150 mg by mouth every 12 (twelve) hours. (Patient not taking: Reported on 02/23/2024)     No current facility-administered medications on file prior to visit.     ROS see history of present illness  Objective  Physical Exam Vitals:   02/23/24 1039  BP: 107/66  Pulse: (!) 54  Temp: 98 F (36.7 C)  SpO2: 92%    BP Readings from Last 3 Encounters:  02/23/24 107/66  01/13/24 118/70  10/02/23 110/64   Wt Readings from Last 3 Encounters:  02/23/24 147 lb 12.8 oz (67 kg)  01/13/24 143 lb 6.4 oz (65 kg)  10/02/23 143 lb 6.4 oz (65 kg)    Physical Exam Constitutional:      General: She is not in acute distress.    Appearance: Normal appearance.  HENT:     Head: Normocephalic.  Cardiovascular:     Rate and Rhythm: Normal rate and regular rhythm.     Heart sounds: Normal heart sounds.  Pulmonary:     Effort: Pulmonary effort is normal.     Breath sounds: Normal breath sounds.  Skin:    General: Skin is warm and dry.  Neurological:     General: No focal deficit present.     Mental Status: She is alert.  Psychiatric:        Mood and Affect: Mood normal.        Behavior: Behavior normal.      Assessment/Plan: Please see individual problem list.  COPD with acute exacerbation (HCC) Assessment & Plan: Recent exacerbation has improved with decreased shortness of breath post-hospitalization. She completed antibiotics and is using the Trelegy inhaler. Home health services are in place. Continue Trelegy inhaler and complete home health services for physical therapy, occupational therapy, and nursing. Hospital discharge summary, notes, labs and imaging reviewed.    SVT (supraventricular tachycardia) (HCC) Assessment & Plan: SVT was identified during hospitalization. A  heart monitor evaluated her rhythm, and metoprolol  was started. Her heart rate is 54 bpm. The heart monitor is ready for return. Continue metoprolol  and remove the heart monitor for return via UPS.   Seizure [facility]) Assessment & Plan: Pseudo-seizures are present. Neurology discontinued seizure medication. Driving status is pending neurology confirmation. Follow up with Neurology as scheduled.    Tobacco use Assessment & Plan: She has an ongoing tobacco use disorder and is attempting to reduce smoking. Previous nicotine  patch trials were unsuccessful. Consider nicotine  replacement therapy options such as gum or lozenges and encourage continued reduction in smoking.        Return for as scheduled.   Karen Glance, NP-C [facility]

## 2024-02-23 NOTE — Telephone Encounter (Signed)
 Form faxed to [phone] with a completed transmission log

## 2024-02-24 ENCOUNTER — Other Ambulatory Visit

## 2024-02-24 DIAGNOSIS — Z9181 History of falling: Secondary | ICD-10-CM | POA: Diagnosis not present

## 2024-02-24 DIAGNOSIS — Z7984 Long term (current) use of oral hypoglycemic drugs: Secondary | ICD-10-CM | POA: Diagnosis not present

## 2024-02-24 DIAGNOSIS — Z9981 Dependence on supplemental oxygen: Secondary | ICD-10-CM | POA: Diagnosis not present

## 2024-02-24 DIAGNOSIS — R531 Weakness: Secondary | ICD-10-CM | POA: Diagnosis not present

## 2024-02-24 DIAGNOSIS — J441 Chronic obstructive pulmonary disease with (acute) exacerbation: Secondary | ICD-10-CM | POA: Diagnosis not present

## 2024-02-24 DIAGNOSIS — K589 Irritable bowel syndrome without diarrhea: Secondary | ICD-10-CM | POA: Diagnosis not present

## 2024-02-24 DIAGNOSIS — I060 Rheumatic aortic stenosis: Secondary | ICD-10-CM | POA: Diagnosis not present

## 2024-02-24 DIAGNOSIS — K219 Gastro-esophageal reflux disease without esophagitis: Secondary | ICD-10-CM | POA: Diagnosis not present

## 2024-02-24 DIAGNOSIS — K760 Fatty (change of) liver, not elsewhere classified: Secondary | ICD-10-CM | POA: Diagnosis not present

## 2024-02-24 DIAGNOSIS — Z7902 Long term (current) use of antithrombotics/antiplatelets: Secondary | ICD-10-CM | POA: Diagnosis not present

## 2024-02-24 DIAGNOSIS — E785 Hyperlipidemia, unspecified: Secondary | ICD-10-CM | POA: Diagnosis not present

## 2024-02-24 DIAGNOSIS — I5030 Unspecified diastolic (congestive) heart failure: Secondary | ICD-10-CM | POA: Diagnosis not present

## 2024-02-24 DIAGNOSIS — I471 Supraventricular tachycardia, unspecified: Secondary | ICD-10-CM | POA: Diagnosis not present

## 2024-02-24 DIAGNOSIS — J439 Emphysema, unspecified: Secondary | ICD-10-CM | POA: Diagnosis not present

## 2024-02-24 DIAGNOSIS — I110 Hypertensive heart disease with heart failure: Secondary | ICD-10-CM | POA: Diagnosis not present

## 2024-02-24 DIAGNOSIS — H9190 Unspecified hearing loss, unspecified ear: Secondary | ICD-10-CM | POA: Diagnosis not present

## 2024-02-24 DIAGNOSIS — E119 Type 2 diabetes mellitus without complications: Secondary | ICD-10-CM | POA: Diagnosis not present

## 2024-02-24 DIAGNOSIS — M1990 Unspecified osteoarthritis, unspecified site: Secondary | ICD-10-CM | POA: Diagnosis not present

## 2024-02-24 DIAGNOSIS — G4730 Sleep apnea, unspecified: Secondary | ICD-10-CM | POA: Diagnosis not present

## 2024-02-24 DIAGNOSIS — M069 Rheumatoid arthritis, unspecified: Secondary | ICD-10-CM | POA: Diagnosis not present

## 2024-02-24 DIAGNOSIS — I2510 Atherosclerotic heart disease of native coronary artery without angina pectoris: Secondary | ICD-10-CM | POA: Diagnosis not present

## 2024-02-24 DIAGNOSIS — Z7951 Long term (current) use of inhaled steroids: Secondary | ICD-10-CM | POA: Diagnosis not present

## 2024-02-25 ENCOUNTER — Telehealth: Payer: Self-pay

## 2024-02-25 DIAGNOSIS — K219 Gastro-esophageal reflux disease without esophagitis: Secondary | ICD-10-CM | POA: Diagnosis not present

## 2024-02-25 DIAGNOSIS — E119 Type 2 diabetes mellitus without complications: Secondary | ICD-10-CM | POA: Diagnosis not present

## 2024-02-25 DIAGNOSIS — I5030 Unspecified diastolic (congestive) heart failure: Secondary | ICD-10-CM | POA: Diagnosis not present

## 2024-02-25 DIAGNOSIS — H9190 Unspecified hearing loss, unspecified ear: Secondary | ICD-10-CM | POA: Diagnosis not present

## 2024-02-25 DIAGNOSIS — G4730 Sleep apnea, unspecified: Secondary | ICD-10-CM | POA: Diagnosis not present

## 2024-02-25 DIAGNOSIS — Z7951 Long term (current) use of inhaled steroids: Secondary | ICD-10-CM | POA: Diagnosis not present

## 2024-02-25 DIAGNOSIS — E785 Hyperlipidemia, unspecified: Secondary | ICD-10-CM | POA: Diagnosis not present

## 2024-02-25 DIAGNOSIS — Z9181 History of falling: Secondary | ICD-10-CM | POA: Diagnosis not present

## 2024-02-25 DIAGNOSIS — Z7902 Long term (current) use of antithrombotics/antiplatelets: Secondary | ICD-10-CM | POA: Diagnosis not present

## 2024-02-25 DIAGNOSIS — I110 Hypertensive heart disease with heart failure: Secondary | ICD-10-CM | POA: Diagnosis not present

## 2024-02-25 DIAGNOSIS — K760 Fatty (change of) liver, not elsewhere classified: Secondary | ICD-10-CM | POA: Diagnosis not present

## 2024-02-25 DIAGNOSIS — M1990 Unspecified osteoarthritis, unspecified site: Secondary | ICD-10-CM | POA: Diagnosis not present

## 2024-02-25 DIAGNOSIS — J441 Chronic obstructive pulmonary disease with (acute) exacerbation: Secondary | ICD-10-CM | POA: Diagnosis not present

## 2024-02-25 DIAGNOSIS — J439 Emphysema, unspecified: Secondary | ICD-10-CM | POA: Diagnosis not present

## 2024-02-25 DIAGNOSIS — Z7984 Long term (current) use of oral hypoglycemic drugs: Secondary | ICD-10-CM | POA: Diagnosis not present

## 2024-02-25 DIAGNOSIS — I060 Rheumatic aortic stenosis: Secondary | ICD-10-CM | POA: Diagnosis not present

## 2024-02-25 DIAGNOSIS — K589 Irritable bowel syndrome without diarrhea: Secondary | ICD-10-CM | POA: Diagnosis not present

## 2024-02-25 DIAGNOSIS — Z9981 Dependence on supplemental oxygen: Secondary | ICD-10-CM | POA: Diagnosis not present

## 2024-02-25 DIAGNOSIS — M069 Rheumatoid arthritis, unspecified: Secondary | ICD-10-CM | POA: Diagnosis not present

## 2024-02-25 DIAGNOSIS — I2510 Atherosclerotic heart disease of native coronary artery without angina pectoris: Secondary | ICD-10-CM | POA: Diagnosis not present

## 2024-02-25 DIAGNOSIS — R531 Weakness: Secondary | ICD-10-CM | POA: Diagnosis not present

## 2024-02-25 NOTE — Telephone Encounter (Signed)
 Copied from CRM [id number]. Topic: Clinical - Home Health Verbal Orders >> Feb 25, 2024  3:57 PM Deaijah H wrote: Caller/Agency: [facility] w/ [facility]  Callback Number: [phone] Service Requested: Physical Therapy Frequency: 1x a wk or 4wks  Any new concerns about the patient? No

## 2024-02-26 ENCOUNTER — Telehealth: Payer: Self-pay

## 2024-02-26 NOTE — Telephone Encounter (Signed)
 Left message to return call to our office.

## 2024-02-26 NOTE — Telephone Encounter (Signed)
 Form faxed to [phone] with a completed transmission log

## 2024-02-26 NOTE — Telephone Encounter (Signed)
 Home health forms placed in provider to be signed folder

## 2024-02-29 NOTE — Telephone Encounter (Unsigned)
 Copied from CRM #[id number]. Topic: Clinical - Home Health Verbal Orders >> Feb 29, 2024  2:41 PM Martinique E wrote: Caller/Agency: Norleen, OT with personal home care Callback Number: [phone] Service Requested: Occupational Therapy Frequency: 1x/week for 6 weeks Any new concerns about the patient? No

## 2024-03-08 DIAGNOSIS — K760 Fatty (change of) liver, not elsewhere classified: Secondary | ICD-10-CM | POA: Diagnosis not present

## 2024-03-08 DIAGNOSIS — G4730 Sleep apnea, unspecified: Secondary | ICD-10-CM | POA: Diagnosis not present

## 2024-03-08 DIAGNOSIS — J439 Emphysema, unspecified: Secondary | ICD-10-CM | POA: Diagnosis not present

## 2024-03-08 DIAGNOSIS — R531 Weakness: Secondary | ICD-10-CM | POA: Diagnosis not present

## 2024-03-08 DIAGNOSIS — I110 Hypertensive heart disease with heart failure: Secondary | ICD-10-CM | POA: Diagnosis not present

## 2024-03-08 DIAGNOSIS — Z7902 Long term (current) use of antithrombotics/antiplatelets: Secondary | ICD-10-CM | POA: Diagnosis not present

## 2024-03-08 DIAGNOSIS — J441 Chronic obstructive pulmonary disease with (acute) exacerbation: Secondary | ICD-10-CM | POA: Diagnosis not present

## 2024-03-08 DIAGNOSIS — I060 Rheumatic aortic stenosis: Secondary | ICD-10-CM | POA: Diagnosis not present

## 2024-03-08 DIAGNOSIS — Z9981 Dependence on supplemental oxygen: Secondary | ICD-10-CM | POA: Diagnosis not present

## 2024-03-08 DIAGNOSIS — M069 Rheumatoid arthritis, unspecified: Secondary | ICD-10-CM | POA: Diagnosis not present

## 2024-03-08 DIAGNOSIS — I5030 Unspecified diastolic (congestive) heart failure: Secondary | ICD-10-CM | POA: Diagnosis not present

## 2024-03-09 ENCOUNTER — Encounter: Payer: Self-pay | Admitting: Nurse Practitioner

## 2024-03-09 NOTE — Assessment & Plan Note (Signed)
 She has an ongoing tobacco use disorder and is attempting to reduce smoking. Previous nicotine  patch trials were unsuccessful. Consider nicotine  replacement therapy options such as gum or lozenges and encourage continued reduction in smoking.

## 2024-03-09 NOTE — Assessment & Plan Note (Signed)
 SVT was identified during hospitalization. A heart monitor evaluated her rhythm, and metoprolol  was started. Her heart rate is 54 bpm. The heart monitor is ready for return. Continue metoprolol  and remove the heart monitor for return via [employer].

## 2024-03-09 NOTE — Assessment & Plan Note (Addendum)
 Recent exacerbation has improved with decreased shortness of breath post-hospitalization. She completed antibiotics and is using the Trelegy inhaler. Home health services are in place. Continue Trelegy inhaler and complete home health services for physical therapy, occupational therapy, and nursing. Hospital discharge summary, notes, labs and imaging reviewed.

## 2024-03-09 NOTE — Assessment & Plan Note (Signed)
 Pseudo-seizures are present. Neurology discontinued seizure medication. Driving status is pending neurology confirmation. Follow up with Neurology as scheduled.

## 2024-03-14 ENCOUNTER — Encounter: Payer: Self-pay | Admitting: Pharmacist

## 2024-03-14 NOTE — Progress Notes (Signed)
 Pharmacy Quality Measure Review  This patient is appearing on a report for being at risk of failing the adherence measure for hypertension (ACEi/ARB) medications this calendar year.   Medication: losartan  10 mg Last fill date: 12/12/23 for 30 day supply  Medication no longer prescribed. Was held at hospital encounter.  No further action needed at this time. May continue to appear on measure list.

## 2024-03-15 ENCOUNTER — Telehealth: Payer: Self-pay

## 2024-03-15 DIAGNOSIS — E785 Hyperlipidemia, unspecified: Secondary | ICD-10-CM | POA: Diagnosis not present

## 2024-03-15 NOTE — Telephone Encounter (Signed)
 HOME HEALTH FORM RECEIVED AND PLACED IN PROVIDER TO BE SIGNED FOLDER

## 2024-03-17 NOTE — Telephone Encounter (Signed)
 Form faxed to [phone] with a completed transmission log

## 2024-03-21 ENCOUNTER — Telehealth: Payer: Self-pay

## 2024-03-21 NOTE — Telephone Encounter (Signed)
 [facility] home health forms placed in provider to be signed folder

## 2024-03-22 ENCOUNTER — Telehealth: Payer: Self-pay

## 2024-03-22 NOTE — Telephone Encounter (Signed)
 Received a call from a Patient Navigator in Radiology at [facility])    She stated the pt was had an incidental finding on her CT Chest & Pelvis which was done back in August when she was seen at their ED   A hypo dense right hepatic lobe lesion which was 1.6 cm it was not characterized and they were recommending a contrast MRI of the abdomen for further evaluation.   [name] stated she would be faxing over the MRI soon.   She gave me a call back number if we do not receive it in a reasonable time.    [phone]

## 2024-03-22 NOTE — Telephone Encounter (Signed)
 Form faxed w/ a completed transmission log @ [phone]

## 2024-04-06 ENCOUNTER — Other Ambulatory Visit: Payer: Self-pay

## 2024-04-06 MED ORDER — METOPROLOL SUCCINATE ER 50 MG PO TB24: 50.0000 mg | ORAL_TABLET | Freq: Every day | ORAL | 3 refills | Status: AC

## 2024-04-08 ENCOUNTER — Other Ambulatory Visit: Payer: Self-pay | Admitting: Nurse Practitioner

## 2024-04-08 DIAGNOSIS — K769 Liver disease, unspecified: Secondary | ICD-10-CM

## 2024-04-14 ENCOUNTER — Ambulatory Visit: Admitting: Nurse Practitioner

## 2024-04-22 ENCOUNTER — Ambulatory Visit
Admission: RE | Admit: 2024-04-22 | Discharge: 2024-04-22 | Disposition: A | Source: Ambulatory Visit | Attending: Nurse Practitioner

## 2024-04-22 DIAGNOSIS — N281 Cyst of kidney, acquired: Secondary | ICD-10-CM | POA: Diagnosis not present

## 2024-04-22 DIAGNOSIS — Z9049 Acquired absence of other specified parts of digestive tract: Secondary | ICD-10-CM | POA: Diagnosis not present

## 2024-04-22 DIAGNOSIS — K769 Liver disease, unspecified: Secondary | ICD-10-CM | POA: Diagnosis not present

## 2024-04-22 MED ORDER — GADOBUTROL 1 MMOL/ML IV SOLN
6.0000 mL | Freq: Once | INTRAVENOUS | Status: AC | PRN
  Administered 2024-04-22: 6 mL via INTRAVENOUS

## 2024-05-02 ENCOUNTER — Telehealth: Payer: Self-pay

## 2024-05-02 NOTE — Telephone Encounter (Signed)
 Jacqui emailed me from [employer] to let us  know that they do not have an interpreter available for appointment on 05/03/2024.

## 2024-05-02 NOTE — Telephone Encounter (Signed)
 Noted pt doesn't use interpreter

## 2024-05-03 ENCOUNTER — Ambulatory Visit (INDEPENDENT_AMBULATORY_CARE_PROVIDER_SITE_OTHER): Admitting: Nurse Practitioner

## 2024-05-03 ENCOUNTER — Encounter: Payer: Self-pay | Admitting: Nurse Practitioner

## 2024-05-03 ENCOUNTER — Ambulatory Visit: Payer: Self-pay | Admitting: Nurse Practitioner

## 2024-05-03 VITALS — BP 140/80 | HR 62 | Temp 93.0°F | Ht 63.0 in | Wt 145.2 lb

## 2024-05-03 DIAGNOSIS — R52 Pain, unspecified: Secondary | ICD-10-CM | POA: Diagnosis not present

## 2024-05-03 DIAGNOSIS — R059 Cough, unspecified: Secondary | ICD-10-CM | POA: Diagnosis not present

## 2024-05-03 DIAGNOSIS — J069 Acute upper respiratory infection, unspecified: Secondary | ICD-10-CM | POA: Diagnosis not present

## 2024-05-03 DIAGNOSIS — K769 Liver disease, unspecified: Secondary | ICD-10-CM | POA: Insufficient documentation

## 2024-05-03 LAB — POC COVID19 BINAXNOW: SARS Coronavirus 2 Ag: NEGATIVE

## 2024-05-03 LAB — POCT INFLUENZA A/B
Influenza A, POC: NEGATIVE
Influenza B, POC: NEGATIVE

## 2024-05-03 MED ORDER — BENZONATATE 200 MG PO CAPS: 200.0000 mg | ORAL_CAPSULE | Freq: Three times a day (TID) | ORAL | 0 refills | Status: AC | PRN

## 2024-05-03 MED ORDER — DOXYCYCLINE HYCLATE 100 MG PO TABS: 100.0000 mg | ORAL_TABLET | Freq: Two times a day (BID) | ORAL | 0 refills | Status: AC

## 2024-05-03 MED ORDER — PREDNISONE 20 MG PO TABS: 40.0000 mg | ORAL_TABLET | Freq: Every day | ORAL | 0 refills | Status: AC

## 2024-05-03 NOTE — Assessment & Plan Note (Signed)
 She presents with an acute upper respiratory infection characterized by cough, rhinorrhea, nasal congestion, and headaches, but no fever. Shortness of breath is worse than baseline. COVID-19 and influenza swab results are negative. There is a risk of progression to pneumonia due to her significant respiratory history. She has allergies to multiple antibiotics. Start Doxycycline  twice daily and Prednisone . Continue guaifenesin  for congestion. Advise hydration and instruct her to seek medical attention if shortness of breath worsens.

## 2024-05-03 NOTE — Assessment & Plan Note (Signed)
 A liver lesion approximately two centimeters was identified on a chest scan. MRI completed- results as follows: There is a 2.0 x 2.4 x 2.8 cm lesion in the central liver, predominantly segment 8, which is nonspecific. Differential diagnosis includes metastasis, benign etiology such as sclerosed hemangioma, hepatic adenoma, etc. Consider short-term follow-up MRI abdomen with Eovist contrast in 3-6 months. Repeat MRI of the liver in three months, orders placed. We will continue to monitor.

## 2024-05-03 NOTE — Progress Notes (Signed)
 Leron Glance, NP-C Phone: [phone]  [name] is a 73 y.o. female who presents today for cough.   Discussed the use of AI scribe software for clinical note transcription with the patient, who gave verbal consent to proceed.  History of Present Illness   [name] is a 73 year old female who presents with a cough, congestion, and runny nose.  She has been experiencing a persistent cough, runny nose, and nasal congestion for the past five days. She reports chest and nasal congestion, body aches, headaches, and increased fatigue. She denies sore throat and fever but mentions sneezing and occasional sharp ear pain. She has been using Mucinex  every morning since the symptoms began, except for today. She denies known exposure to COVID-19 or flu.  She has a history of nonalcoholic steatohepatitis (NASH) and was informed of a lesion on her liver following a chest scan at [facility]. The lesion is approximately two centimeters in size. She experiences occasional right upper quadrant pain but denies significant weight loss. Her gallbladder has been removed. She fell off her front porch prior to the scan, which was conducted after this incident.  She has allergies to several antibiotics. She can tolerate penicillin if taken with Benadryl  to prevent rashes and itching. She has inhalers available for use if needed and has previously been treated with prednisone  during similar episodes. She does not currently have the 'little yellow cough pearls' but has used them in the past.      Social History   Tobacco Use  Smoking Status Every Day   Current packs/day: 0.50   Average packs/day: 0.5 packs/day for 56.2 years (28.1 ttl pk-yrs)   Types: Cigarettes   Start date: 03/01/1968  Smokeless Tobacco Never    Current Outpatient Medications on File Prior to Visit  Medication Sig Dispense Refill   citalopram  (CELEXA ) 10 MG tablet Take 1 tablet (10 mg total) by mouth daily. 90 tablet 3   clopidogrel   (PLAVIX ) 75 MG tablet Take 1 tablet (75 mg total) by mouth daily. 90 tablet 3   ezetimibe (ZETIA) 10 MG tablet Take 1 tablet by mouth daily.     fluticasone  (FLONASE ) 50 MCG/ACT nasal spray Place 1 spray into both nostrils 2 (two) times daily.     furosemide  (LASIX ) 40 MG tablet Take 1 tablet (40 mg total) by mouth daily. (Patient not taking: Reported on 02/23/2024) 90 tablet 3   gabapentin  (NEURONTIN ) 100 MG capsule Take 2 capsules (200 mg total) by mouth at bedtime. 180 capsule 3   glimepiride  (AMARYL ) 2 MG tablet Take 1 tablet (2 mg total) by mouth daily with breakfast. 90 tablet 3   Glucose Blood (BLOOD GLUCOSE TEST STRIPS) STRP 1 each by In Vitro route daily. May substitute to any manufacturer covered by patient's insurance. 100 strip 2   ipratropium (ATROVENT ) 0.02 % nebulizer solution Take 2.5 mLs (0.5 mg total) by nebulization 4 (four) times daily. 75 mL 12   isosorbide  mononitrate (IMDUR ) 30 MG 24 hr tablet Take 30 mg by mouth daily.     Lacosamide  (VIMPAT ) 150 MG TABS Take 150 mg by mouth every 12 (twelve) hours. (Patient not taking: Reported on 02/23/2024)     metoprolol  succinate (TOPROL -XL) 50 MG 24 hr tablet Take 1 tablet (50 mg total) by mouth daily. 90 tablet 3   montelukast  (SINGULAIR ) 10 MG tablet Take 1 tablet (10 mg total) by mouth at bedtime. 90 tablet 3   pantoprazole  (PROTONIX ) 40 MG tablet Take 1 tablet (40  mg total) by mouth daily. 90 tablet 3   potassium chloride  (KLOR-CON ) 10 MEQ tablet Take 2 tablets (20 mEq total) by mouth daily. 180 tablet 3   rOPINIRole  (REQUIP ) 0.5 MG tablet Take 1 tablet (0.5 mg total) by mouth at bedtime. 30 tablet 0   rosuvastatin  (CRESTOR ) 10 MG tablet Take 1 tablet (10 mg total) by mouth at bedtime. 90 tablet 3   spironolactone  (ALDACTONE ) 25 MG tablet Take 1 tablet by mouth daily.     No current facility-administered medications on file prior to visit.     ROS see history of present illness  Objective  Physical Exam Vitals:   05/03/24  1149 05/03/24 1205  BP: (!) 153/78 (!) 140/80  Pulse: 62   Temp: (!) 93 F (33.9 C)   SpO2: 98%     BP Readings from Last 3 Encounters:  05/03/24 (!) 140/80  02/23/24 107/66  01/13/24 118/70   Wt Readings from Last 3 Encounters:  05/03/24 145 lb 3.2 oz (65.9 kg)  02/23/24 147 lb 12.8 oz (67 kg)  01/13/24 143 lb 6.4 oz (65 kg)    Physical Exam Constitutional:      General: She is not in acute distress.    Appearance: Normal appearance.  HENT:     Head: Normocephalic.     Right Ear: Tympanic membrane normal.     Left Ear: Tympanic membrane normal.     Nose: Congestion present.     Mouth/Throat:     Mouth: Mucous membranes are moist.     Pharynx: Oropharynx is clear.  Eyes:     Conjunctiva/sclera: Conjunctivae normal.     Pupils: Pupils are equal, round, and reactive to light.  Cardiovascular:     Rate and Rhythm: Normal rate and regular rhythm.     Heart sounds: Normal heart sounds.  Pulmonary:     Effort: Pulmonary effort is normal.     Breath sounds: Wheezing (bilateral upper) and rhonchi (global) present.  Lymphadenopathy:     Cervical: No cervical adenopathy.  Skin:    General: Skin is warm and dry.  Neurological:     General: No focal deficit present.     Mental Status: She is alert.  Psychiatric:        Mood and Affect: Mood normal.        Behavior: Behavior normal.      Assessment/Plan: Please see individual problem list.  URI with cough and congestion Assessment & Plan: She presents with an acute upper respiratory infection characterized by cough, rhinorrhea, nasal congestion, and headaches, but no fever. Shortness of breath is worse than baseline. COVID-19 and influenza swab results are negative. There is a risk of progression to pneumonia due to her significant respiratory history. She has allergies to multiple antibiotics. Start Doxycycline  twice daily and Prednisone . Continue guaifenesin  for congestion. Advise hydration and instruct her to seek  medical attention if shortness of breath worsens.  Orders: -     Doxycycline  Hyclate; Take 1 tablet (100 mg total) by mouth 2 (two) times daily.  Dispense: 14 tablet; Refill: 0 -     predniSONE ; Take 2 tablets (40 mg total) by mouth daily.  Dispense: 10 tablet; Refill: 0 -     Benzonatate ; Take 1 capsule (200 mg total) by mouth 3 (three) times daily as needed for cough.  Dispense: 30 capsule; Refill: 0  Liver lesion, right lobe Assessment & Plan: A liver lesion approximately two centimeters was identified on a chest scan. MRI completed- results  as follows: There is a 2.0 x 2.4 x 2.8 cm lesion in the central liver, predominantly segment 8, which is nonspecific. Differential diagnosis includes metastasis, benign etiology such as sclerosed hemangioma, hepatic adenoma, etc. Consider short-term follow-up MRI abdomen with Eovist contrast in 3-6 months. Repeat MRI of the liver in three months, orders placed. We will continue to monitor.   Orders: -     MR LIVER W CONTRAST; Future  Cough, unspecified type -     POC COVID-19 BinaxNow -     POCT Influenza A/B  Body aches -     POC COVID-19 BinaxNow -     POCT Influenza A/B     Return in about 3 months (around 08/03/2024), or if symptoms worsen or fail to improve, for Follow up.   Leron Glance, NP-C [facility]

## 2024-05-16 ENCOUNTER — Telehealth: Payer: Self-pay

## 2024-05-16 NOTE — Transitions of Care (Post Inpatient/ED Visit) (Signed)
   05/16/2024  Name: [name] MRN: [id number] DOB: 08/28/1950  Today's TOC FU Call Status: Today's TOC FU Call Status:: Unsuccessful Call (1st Attempt) Unsuccessful Call (1st Attempt) Date: 05/16/24  Attempted to reach the patient regarding the most recent Inpatient/ED visit.  Follow Up Plan: Additional outreach attempts will be made to reach the patient to complete the Transitions of Care (Post Inpatient/ED visit) call.   Arvin Seip RN, BSN, CCM [employer], Population Health Case Manager Phone: [phone]

## 2024-05-17 ENCOUNTER — Telehealth: Payer: Self-pay

## 2024-05-17 NOTE — Patient Instructions (Signed)
 Visit Information  Thank you for taking time to visit with me today. Please don't hesitate to contact me if I can be of assistance to you.   Patient instructions: Take medications as prescribed Keep follow up appointments with provider Notify provider for any new/ ongoing symptoms Call 911 for severe shortness of breath and/ or chest pain Continued to wear oxygen  as recommended by provider Monitor oxygen  level daily   Patient verbalizes understanding of instructions and care plan provided today and agrees to view in MyChart. Active MyChart status and patient understanding of how to access instructions and care plan via MyChart confirmed with patient.     The patient has been provided with contact information for the care management team and has been advised to call with any health related questions or concerns.   Please call the care guide team at [phone] if you need to cancel or reschedule your appointment.   Please call the Suicide and Crisis Lifeline: 988 call the [place]  National Suicide Prevention Lifeline: 604 743 1367 or TTY: 613 192 5784 TTY 782-884-8904) to talk to a trained counselor call 1-800-273-TALK (toll free, 24 hour hotline) if you are experiencing a Mental Health or Behavioral Health Crisis or need someone to talk to.  Arvin Seip RN, BSN, CCM [employer], Population Health Case Manager Phone: [phone]

## 2024-05-17 NOTE — Transitions of Care (Post Inpatient/ED Visit) (Signed)
 05/17/2024  Name: [name] MRN: [id number] DOB: Sep 15, 1950  Today's TOC FU Call Status: Today's TOC FU Call Status:: Successful TOC FU Call Completed TOC FU Call Complete Date: 05/17/24  Patient's Name and Date of Birth confirmed. Name, DOB (HIPAA verified by son [name])  Transition Care Management Follow-up Telephone Call Date of Discharge: 05/13/24 Discharge Facility: Other [facility]) Name of Other (Non[facility]) Discharge Facility: [facility] hospital Type of Discharge: Inpatient Admission Primary Inpatient Discharge Diagnosis:: shortness of breath How have you been since you were released from the hospital?: Better Any questions or concerns?: No  Items Reviewed: Did you receive and understand the discharge instructions provided?: Yes Medications obtained,verified, and reconciled?: Yes (Medications Reviewed) Any new allergies since your discharge?: No Dietary orders reviewed?: Yes Type of Diet Ordered:: regular Do you have support at home?: Yes People in Home [RPT]: child(ren), adult Name of Support/Comfort Primary Source: [name]  Medications Reviewed Today: Medications Reviewed Today     Reviewed by Karen Gossard Sherman, [name] (Registered Nurse) on 05/17/24 at 1531  Med List Status: <None>   Medication Order Taking? Sig Documenting Provider Last Dose Status Informant  benzonatate  (TESSALON ) 200 MG capsule [id number] Yes Take 1 capsule (200 mg total) by mouth 3 (three) times daily as needed for cough. Gretel App, NP  Active   citalopram  (CELEXA ) 10 MG tablet [id number] Yes Take 1 tablet (10 mg total) by mouth daily. Gretel App, NP  Active   clopidogrel  (PLAVIX ) 75 MG tablet [id number] Yes Take 1 tablet (75 mg total) by mouth daily. Gretel App, NP  Active   doxycycline  (VIBRA -TABS) 100 MG tablet [id number]  Take 1 tablet (100 mg total) by mouth 2 (two) times daily.  Patient not taking: Reported on 05/17/2024   Gretel App, NP  Active    ezetimibe (ZETIA) 10 MG tablet [id number] Yes Take 1 tablet by mouth daily. [provider]  Active   fluticasone  (FLONASE ) 50 MCG/ACT nasal spray [id number] Yes Place 1 spray into both nostrils 2 (two) times daily. [provider]  Active Child, Pharmacy Records, Multiple Informants  furosemide  (LASIX ) 40 MG tablet [id number]  Take 1 tablet (40 mg total) by mouth daily.  Patient not taking: Reported on 05/17/2024   Gretel App, NP  Active Child, Pharmacy Records, Multiple Informants  gabapentin  (NEURONTIN ) 100 MG capsule [id number] Yes Take 2 capsules (200 mg total) by mouth at bedtime. Gretel App, NP  Active   glimepiride  (AMARYL ) 2 MG tablet [id number] Yes Take 1 tablet (2 mg total) by mouth daily with breakfast. Gretel App, NP  Active   Glucose Blood (BLOOD GLUCOSE TEST STRIPS) STRP [id number] Yes 1 each by In Vitro route daily. May substitute to any manufacturer covered by patient's insurance. Gretel App, NP  Active   ipratropium (ATROVENT ) 0.02 % nebulizer solution [id number] Yes Take 2.5 mLs (0.5 mg total) by nebulization 4 (four) times daily. Arlander Lamar, MD  Active Child, Pharmacy Records, Multiple Informants  isosorbide  mononitrate (IMDUR ) 30 MG 24 hr tablet [id number] Yes Take 30 mg by mouth daily. [provider]  Active Child, Pharmacy Records, Multiple Informants  Lacosamide  (VIMPAT ) 150 MG TABS [id number]  Take 150 mg by mouth every 12 (twelve) hours.  Patient not taking: Reported on 02/23/2024   [provider]  Active Child, Pharmacy Records, Multiple Informants  metoprolol  succinate (TOPROL -XL) 50 MG 24 hr tablet [id number]  Take 1 tablet (50 mg total) by mouth daily.  Patient not taking: Reported on 05/17/2024  Gretel App, NP  Active   montelukast  (SINGULAIR ) 10 MG tablet [id number]  Take 1 tablet (10 mg total) by mouth at bedtime.  Patient not taking: Reported on 05/17/2024   Gretel App, NP  Active   pantoprazole  (PROTONIX ) 40 MG tablet  [id number] Yes Take 1 tablet (40 mg total) by mouth daily. Gretel App, NP  Active   potassium chloride  (KLOR-CON ) 10 MEQ tablet [id number]  Take 2 tablets (20 mEq total) by mouth daily.  Patient not taking: Reported on 05/17/2024   Gretel App, NP  Active   predniSONE  (DELTASONE ) 20 MG tablet [id number]  Take 2 tablets (40 mg total) by mouth daily.  Patient not taking: Reported on 05/17/2024   Gretel App, NP  Active   rOPINIRole  (REQUIP ) 0.5 MG tablet [id number]  Take 1 tablet (0.5 mg total) by mouth at bedtime.  Patient not taking: Reported on 05/17/2024   Tobie Calix, MD  Active Child, Pharmacy Records, Multiple Informants  rosuvastatin  (CRESTOR ) 10 MG tablet [id number] Yes Take 1 tablet (10 mg total) by mouth at bedtime. Gretel App, NP  Active   spironolactone  (ALDACTONE ) 25 MG tablet [id number]  Take 1 tablet by mouth daily.  Patient not taking: Reported on 05/17/2024   [provider]  Active             Home Care and Equipment/Supplies: Were Home Health Services Ordered?: Yes Name of Home Health Agency:: [facility] Has Agency set up a time to come to your home?: Yes First Home Health Visit Date: 05/17/24 Any new equipment or medical supplies ordered?: No  Functional Questionnaire: Do you need assistance with bathing/showering or dressing?: No Do you need assistance with meal preparation?: No Do you need assistance with eating?: No Do you have difficulty maintaining continence: No Do you need assistance with getting out of bed/getting out of a chair/moving?: No Do you have difficulty managing or taking your medications?: Yes (son assist with medications)  Follow up appointments reviewed: PCP Follow-up appointment confirmed?: Yes Date of PCP follow-up appointment?: 05/26/24 Follow-up Provider: KYM Aurora, NP Specialist Hospital Follow-up appointment confirmed?: Yes Date of Specialist follow-up appointment?: 05/23/24 Follow-Up Specialty Provider:: Dr. Brigida  ( pulmonary) then sees Dr. Wilburn 05/23/24 ( cardiology) Do you need transportation to your follow-up appointment?: No Do you understand care options if your condition(s) worsen?: Yes-patient verbalized understanding  SDOH Interventions Today    Flowsheet Row Most Recent Value  SDOH Interventions   Food Insecurity Interventions Intervention Not Indicated  Housing Interventions Intervention Not Indicated  Transportation Interventions Intervention Not Indicated  Utilities Interventions Intervention Not Indicated   Discussed and offered 30 day TOC program.  Patient's son declined.  The patient has been provided with contact information for the care management team and has been advised to call with any health -related questions or concerns.  The patient verbalized understanding with current plan of care.  The patient is directed to their insurance card regarding availability of benefits coverage.    Arvin Seip [name], BSN, CCM [employer], Population Health Case Manager Phone: [phone]

## 2024-05-18 ENCOUNTER — Telehealth: Payer: Self-pay

## 2024-05-18 NOTE — Telephone Encounter (Signed)
 Form faxed to [phone]

## 2024-05-18 NOTE — Telephone Encounter (Signed)
 [facility] HOME HEALTH forms placed in provider to be signed folder

## 2024-05-19 ENCOUNTER — Ambulatory Visit: Payer: Self-pay

## 2024-05-19 ENCOUNTER — Telehealth: Payer: Self-pay

## 2024-05-19 NOTE — Telephone Encounter (Signed)
 FYI Only or Action Required?: Action required by provider: update on patient condition.  Patient was last seen in primary care on 05/03/2024 by Gretel App, NP.  Called Nurse Triage reporting Advice Only (Home health OT called to report concern - Vein where IV was in at hospital appears to clotting, enlarged and sore at least 12cm of vein.).  Symptoms began today/unsure.  Interventions attempted: Nothing.  Symptoms are: unchanged.  Triage Disposition: Call PCP Within 24 Hours  Patient/caregiver understands and will follow disposition?: Yes  Copied from CRM #[id number]. Topic: Clinical - Red Word Triage >> May 19, 2024 11:14 AM Ashley R wrote: [facility] that prompted transfer to Nurse Triage:  OT on the line who just saw the patient and had this concern - Vein where IV was in at hospital appears to clotting, enlarged and sore at least 12cm of vein. Suspects ER visit may be required but anticipates pt will be resistant Reason for Disposition  [1] Caller has NON-URGENT question AND [2] triager unable to answer question  Answer Assessment - Initial Assessment Questions Home OT called to report this concern, but did not remain on line to talk to NT.  Vein where IV was in at hospital appears to clotting, enlarged and sore at least 12cm of vein.   Patient was admitted from 05/08/2024-05/13/2024.  Phone call to son, Lamar, who is now made aware and evaluate after he is home from work quarry manager.  He denies any report of pain or observing any issue with patient's arm, but states that she often wears long sleeves and he has not been able to see her arm.  Protocols used: IV Site and Other Symptoms-A-AH

## 2024-05-19 NOTE — Telephone Encounter (Signed)
 See other phone note pts son was advised to go to the ED and we see pt is scheduled on the 20th to see Vincente

## 2024-05-19 NOTE — Telephone Encounter (Signed)
 Copied from CRM #[id number]. Topic: Clinical - Home Health Verbal Orders >> May 19, 2024 11:06 AM Ashley SAUNDERS wrote: Caller/Agency: [facility]) Callback Number: [phone] Service Requested: Occupational Therapy Frequency: 1x week for 8 weeks Any new concerns about the patient? Yes Vein where IV was in at hospital appears to clotting, enlarged and sore at least 12cm of vein

## 2024-05-24 NOTE — Telephone Encounter (Unsigned)
 Copied from CRM [id number]. Topic: Clinical - Home Health Verbal Orders >> May 24, 2024 10:03 AM Alfonso ORN wrote: Caller/Agency: [facility]  Callback Number: [phone] Service Requested: Physical Therapy Frequency: one time a week for 8 weeks starting this week  Any new concerns about the patient? No

## 2024-05-24 NOTE — Telephone Encounter (Signed)
 Verbal orders provided.

## 2024-05-26 ENCOUNTER — Ambulatory Visit: Admitting: Nurse Practitioner

## 2024-05-26 ENCOUNTER — Encounter: Payer: Self-pay | Admitting: Nurse Practitioner

## 2024-05-26 ENCOUNTER — Telehealth: Payer: Self-pay

## 2024-05-26 VITALS — BP 156/94 | HR 127 | Temp 98.3°F | Ht 63.0 in | Wt 150.4 lb

## 2024-05-26 DIAGNOSIS — I10 Essential (primary) hypertension: Secondary | ICD-10-CM | POA: Diagnosis not present

## 2024-05-26 DIAGNOSIS — I8290 Acute embolism and thrombosis of unspecified vein: Secondary | ICD-10-CM

## 2024-05-26 DIAGNOSIS — J449 Chronic obstructive pulmonary disease, unspecified: Secondary | ICD-10-CM | POA: Diagnosis not present

## 2024-05-26 DIAGNOSIS — I742 Embolism and thrombosis of arteries of the upper extremities: Secondary | ICD-10-CM | POA: Insufficient documentation

## 2024-05-26 DIAGNOSIS — I82621 Acute embolism and thrombosis of deep veins of right upper extremity: Secondary | ICD-10-CM | POA: Insufficient documentation

## 2024-05-26 DIAGNOSIS — R000 Tachycardia, unspecified: Secondary | ICD-10-CM | POA: Diagnosis not present

## 2024-05-26 NOTE — Telephone Encounter (Signed)
 [facility] Home Health forms placed in provider to be signed folder

## 2024-05-26 NOTE — Progress Notes (Signed)
 Established Patient Office Visit  Subjective:  Patient ID: [name], female    DOB: Jul 10, 1950  Age: 73 y.o. MRN: [id number]  CC:  Chief Complaint  Patient presents with   Hospitalization Follow-up   Discussed the use of AI scribe software for clinical note transcription with the patient, who gave verbal consent to proceed.  History of Present Illness   History of Present Illness   [name] is a 73 year old female who presents for a hospital follow-up after recent discharge accompanied by her son. She is deaf and can read the lips and understand. Son is with her for language interpretion.  She recently had two hospital admission. First one for dyspnea, cough and COPD exacerbation and she was hospitalized again due to arm swelling, diagnosed with R upper extremity blood clot. She is on anticoagulation therapy for three months.   She has seen pulmonology on 05/23/24. O2 saturation 94%, she is not wearing oxygen  and states that oxygen  is in the car. She has not smoked in the past three weeks and is using nicotine  patches.  She has medication changes done while in the hospital.She was previously on metoprolol , that was stopped due to bradycardia. Januvia was stopped and was started on multaq She has an upcoming cardiology appointment to discuss her medications.   According to last cardiology note on 01/26/24 pt was not taking metoprolol  and losartan , She was on spironolactone  and Imdur .  Pt son states that she was taking metoprolol  but was stopped during the recent hospital visit.   She is tachycardic in the office with HR 127 and saturation 94% on room air.    Past Medical History:  Diagnosis Date   Asthma    CHF (congestive heart failure) (HCC)    Cirrhosis, non-alcoholic (HCC)    COPD (chronic obstructive pulmonary disease) (HCC)    Deaf    Depression    Diabetes mellitus without complication (HCC)    GERD (gastroesophageal reflux disease)    Heart murmur     Hepatitis    History of rheumatic fever    History of scarlet fever    Hypertension    IBS (irritable bowel syndrome)    Lymph node disorder    arm   Neuropathy    On home oxygen  therapy    hs   Orthopnea    Osteoarthritis    RA (rheumatoid arthritis) (HCC)    RLS (restless legs syndrome)    Seizures (HCC)    Shortness of breath dyspnea    Sleep apnea    Stroke (HCC)    tia    Past Surgical History:  Procedure Laterality Date   ABDOMINAL HYSTERECTOMY     BREAST BIOPSY Right 10/30/2020   Stereo Bx, X-clip,  BENIGN BREAST TISSUE WITH FIBROADENOMATOUS   CATARACT EXTRACTION W/PHACO Right 11/23/2014   Procedure: CATARACT EXTRACTION PHACO AND INTRAOCULAR LENS PLACEMENT (IOC);  Surgeon: Newell Ovens, MD;  Location: [facility] ORS;  Service: Ophthalmology;  Laterality: Right;   CATARACT EXTRACTION W/PHACO Left 12/14/2014   Procedure: CATARACT EXTRACTION PHACO AND INTRAOCULAR LENS PLACEMENT (IOC);  Surgeon: Newell Ovens, MD;  Location: [facility] ORS;  Service: Ophthalmology;  Laterality: Left;  US :01:16.6 AP:15.8 CDE:12.14   CESAREAN SECTION     CHOLECYSTECTOMY     COLONOSCOPY WITH PROPOFOL  N/A 10/08/2020   Procedure: COLONOSCOPY WITH PROPOFOL ;  Surgeon: Toledo, Ladell POUR, MD;  Location: [facility] ENDOSCOPY;  Service: Gastroenterology;  Laterality: N/A;   ESOPHAGOGASTRODUODENOSCOPY (EGD) WITH PROPOFOL  N/A 10/08/2020  Procedure: ESOPHAGOGASTRODUODENOSCOPY (EGD) WITH PROPOFOL ;  Surgeon: Toledo, Ladell POUR, MD;  Location: [facility] ENDOSCOPY;  Service: Gastroenterology;  Laterality: N/A;  DM DEAF, NEEDS SIGN INTERPRETER PER SON   REVERSE SHOULDER ARTHROPLASTY Right 12/21/2019   Procedure: REVERSE SHOULDER ARTHROPLASTY;  Surgeon: Leora Lynwood SAUNDERS, MD;  Location: [facility] ORS;  Service: Orthopedics;  Laterality: Right;   THUMB ARTHROSCOPY     TONSILLECTOMY     TYMPANOPLASTY     muliple    Family History  Problem Relation Age of Onset   Lung cancer Mother    CAD Father    Miscarriages / Stillbirths  Sister    Hyperlipidemia Sister    Hypertension Sister    Diabetes Sister    COPD Sister    Asthma Sister    Arthritis Sister    Breast cancer Sister    Heart attack Sister    Hyperlipidemia Sister    Heart disease Sister    Heart attack Sister    Diabetes Sister    Cancer Sister    Asthma Sister    Heart disease Brother    Heart attack Brother    Depression Brother    Hypertension Son    Hyperlipidemia Son    Heart disease Son    Depression Son    Hypertension Daughter    Depression Daughter    Asthma Daughter    Birth defects Paternal Uncle    Hearing loss Brother     Social History   Socioeconomic History   Marital status: Widowed    Spouse name: Not on file   Number of children: Not on file   Years of education: Not on file   Highest education level: Not on file  Occupational History   Not on file  Tobacco Use   Smoking status: Former    Current packs/day: 0.50    Average packs/day: 0.5 packs/day for 56.2 years (28.1 ttl pk-yrs)    Types: Cigarettes    Start date: 03/01/1968   Smokeless tobacco: Never   Tobacco comments:    Quit smoking 3 weeks ago. Using nicotine  patch.    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    Social Drivers of Corporate Investment Banker Strain: High Risk (02/14/2024)   Received from [facility]   Overall Financial Resource Strain (CARDIA)    Difficulty of Paying Living Expenses: Very hard  Food Insecurity: No Food Insecurity (05/17/2024)   Hunger Vital Sign    Worried About Running Out of Food in the Last Year: Never true    Ran Out of Food in the Last Year: Never  true  Transportation Needs: No Transportation Needs (05/17/2024)   PRAPARE - Administrator, Civil Service (Medical): No    Lack of Transportation (Non-Medical): No  Physical Activity: Not on file  Stress: Not on file  Social Connections: Moderately Integrated (09/14/2023)   Social Connection and Isolation Panel    Frequency of Communication with Friends and Family: Three times a week    Frequency of Social Gatherings with  Friends and Family: Three times a week    Attends Religious Services: More than 4 times per year    Active Member of Clubs or Organizations: Yes    Attends Banker Meetings: More than 4 times per year    Marital Status: Widowed  Intimate Partner Violence: Not At Risk (05/17/2024)   Humiliation, Afraid, Rape, and Kick questionnaire    Fear of Current or Ex-Partner: No    Emotionally Abused: No    Physically Abused: No    Sexually Abused: No     Outpatient Medications Prior to Visit  Medication Sig Dispense Refill   benzonatate  (TESSALON ) 200 MG capsule Take 1 capsule (200 mg total) by mouth 3 (three) times daily as needed for cough. 30 capsule 0   citalopram  (CELEXA ) 10 MG tablet Take 1 tablet (10 mg total) by mouth daily. 90 tablet 3   clopidogrel  (PLAVIX ) 75 MG tablet Take 1 tablet (75 mg total) by mouth daily. 90 tablet 3   ezetimibe (ZETIA) 10 MG tablet Take 1 tablet by mouth daily.     fluticasone  (FLONASE ) 50 MCG/ACT nasal spray Place 1 spray into both nostrils 2 (two) times daily.     gabapentin  (NEURONTIN ) 100 MG capsule Take 2 capsules (200 mg total) by mouth at bedtime. 180 capsule 3   glimepiride  (AMARYL ) 2 MG tablet Take 1 tablet (2 mg total) by mouth daily with breakfast. 90 tablet 3   Glucose Blood (BLOOD GLUCOSE TEST STRIPS) STRP 1 each by In Vitro route daily. May substitute to any manufacturer covered by patient's insurance. 100 strip 2   ipratropium (ATROVENT ) 0.02 % nebulizer solution Take 2.5 mLs (0.5 mg total) by  nebulization 4 (four) times daily. 75 mL 12   isosorbide  mononitrate (IMDUR ) 30 MG 24 hr tablet Take 30 mg by mouth daily.     pantoprazole  (PROTONIX ) 40 MG tablet Take 1 tablet (40 mg total) by mouth daily. 90 tablet 3   rosuvastatin  (CRESTOR ) 10 MG tablet Take 1 tablet (10 mg total) by mouth at bedtime. 90 tablet 3   doxycycline  (VIBRA -TABS) 100 MG tablet Take 1 tablet (100 mg total) by mouth 2 (two) times daily. (Patient not taking: Reported on 05/17/2024) 14 tablet 0   furosemide  (LASIX ) 40 MG tablet Take 1 tablet (40 mg total) by mouth daily. (Patient not taking: Reported on 05/26/2024) 90 tablet 3   Lacosamide  (VIMPAT ) 150 MG TABS Take 150 mg by mouth every 12 (twelve) hours. (Patient not taking: Reported on 05/26/2024)     metoprolol  succinate (TOPROL -XL) 50 MG 24 hr tablet Take 1 tablet (50 mg total) by mouth daily. (Patient not taking: Reported on 05/26/2024) 90 tablet 3   montelukast  (SINGULAIR ) 10 MG tablet Take 1 tablet (10 mg total) by mouth at bedtime. (Patient not taking: Reported on 05/26/2024) 90 tablet 3   potassium chloride  (KLOR-CON ) 10 MEQ tablet Take 2 tablets (20 mEq total) by mouth daily. (Patient not taking: Reported on 05/26/2024) 180 tablet 3   predniSONE  (DELTASONE ) 20 MG tablet Take 2 tablets (40 mg total) by mouth daily. (Patient not taking: Reported on 05/17/2024) 10 tablet 0   rOPINIRole  (REQUIP ) 0.5 MG tablet Take 1 tablet (0.5 mg total) by mouth at bedtime. (Patient not taking: Reported on 05/26/2024) 30 tablet 0   spironolactone  (ALDACTONE ) 25 MG tablet Take 1 tablet by mouth daily. (Patient not taking: Reported on 05/26/2024)     No facility-administered medications prior to visit.    Allergies  Allergen Reactions  Celebrex [Celecoxib] Itching    itching   Ciprofloxacin Itching   Codeine Itching   Fosphenytoin Itching and Other (See Comments)   Levaquin  [Levofloxacin  In D5w] Itching   Levofloxacin  Itching   Lovastatin Itching   Pravastatin Itching    Sulfa Antibiotics Itching   Aspirin Itching and Rash   Azithromycin  Rash   Penicillins Rash    Documentation indicates severe reaction  Pt tolerated cephalosporin without adverse reaction 09/18     ROS Review of Systems Negative unless indicated in HPI.    Objective:    Physical Exam Constitutional:      Appearance: Normal appearance.  HENT:     Mouth/Throat:     Mouth: Mucous membranes are moist.  Eyes:     Conjunctiva/sclera: Conjunctivae normal.     Pupils: Pupils are equal, round, and reactive to light.  Cardiovascular:     Rate and Rhythm: Regular rhythm. Tachycardia present.     Pulses: Normal pulses.     Heart sounds: Normal heart sounds.  Pulmonary:     Effort: Pulmonary effort is normal.     Breath sounds: Wheezing present.     Comments: Wheezing all over Musculoskeletal:     Cervical back: Normal range of motion. No tenderness.  Skin:    General: Skin is warm.     Findings: No bruising.  Neurological:     General: No focal deficit present.     Mental Status: She is alert and oriented to person, place, and time. Mental status is at baseline.  Psychiatric:        Mood and Affect: Mood normal.        Behavior: Behavior normal.        Thought Content: Thought content normal.        Judgment: Judgment normal.     BP (!) 156/94   Pulse (!) 127   Temp 98.3 F (36.8 C)   Ht 5' 3 (1.6 m)   Wt 150 lb 6.4 oz (68.2 kg)   SpO2 94%   BMI 26.64 kg/m  Wt Readings from Last 3 Encounters:  05/26/24 150 lb 6.4 oz (68.2 kg)  05/03/24 145 lb 3.2 oz (65.9 kg)  02/23/24 147 lb 12.8 oz (67 kg)     Health Maintenance  Topic Date Due   FOOT EXAM  Never done   OPHTHALMOLOGY EXAM  Never done   Diabetic kidney evaluation - Urine ACR  Never done   Hepatitis C Screening  Never done   Zoster Vaccines- Shingrix (1 of 2) Never done   Bone Density Scan  Never done   Medicare Annual Wellness (AWV)  09/18/2018   Pneumococcal Vaccine: 50+ Years (3 of 3 - PCV20 or  PCV21) 08/18/2022   COVID-19 Vaccine (4 - 2025-26 season) 03/07/2024   Lung Cancer Screening  06/23/2024   HEMOGLOBIN A1C  07/15/2024   Mammogram  12/10/2024   Diabetic kidney evaluation - eGFR measurement  01/12/2025   Colonoscopy  10/09/2030   DTaP/Tdap/Td (3 - Td or Tdap) 12/10/2032   Influenza Vaccine  Completed   Meningococcal B Vaccine  Aged Out    There are no preventive care reminders to display for this patient.  Lab Results  Component Value Date   TSH 1.62 01/13/2024   Lab Results  Component Value Date   WBC 5.1 01/13/2024   HGB 13.1 01/13/2024   HCT 40.8 01/13/2024   MCV 79.4 01/13/2024   PLT 254.0 01/13/2024   Lab Results  Component Value Date  NA 141 01/13/2024   K 4.3 01/13/2024   CO2 27 01/13/2024   GLUCOSE 59 (L) 01/13/2024   BUN 10 01/13/2024   CREATININE 0.82 01/13/2024   BILITOT 0.4 01/13/2024   ALKPHOS 102 01/13/2024   AST 21 01/13/2024   ALT 13 01/13/2024   PROT 7.5 01/13/2024   ALBUMIN 4.4 01/13/2024   CALCIUM  9.7 01/13/2024   ANIONGAP 11 09/13/2023   GFR 71.43 01/13/2024   Lab Results  Component Value Date   CHOL 136 11/19/2022   Lab Results  Component Value Date   HDL 52.10 11/19/2022   Lab Results  Component Value Date   LDLCALC 60 11/19/2022   Lab Results  Component Value Date   TRIG 117.0 11/19/2022   Lab Results  Component Value Date   CHOLHDL 3 11/19/2022   Lab Results  Component Value Date   HGBA1C 6.7 (H) 01/13/2024      Assessment & Plan:   Assessment & Plan Occlusive thrombus Recent diagnosis, on Eliquis for anticoagulation. Bleeding risk noted. - Continue Eliquis for three months. - Referred to vein specialist. - Monitor for bleeding or swelling.  Orders:   Ambulatory referral to Vascular Surgery  Tachycardia Tachycardia, EKG with no acute changes. -Followed by cardiology. - Metoprolol  discontinued during recent hospitalization.   - Pt declined labs today, prefer to do it next OV In 2  weeks.  Orders:   EKG 12-Lead   CBC w/Diff   Comp Met (CMET)   TSH   Magnesium   Essential hypertension Hypertension with bradycardia episodes while in the hospital Metoprolol  and spirolactone discontinued.  - Monitor blood pressure and heart rate daily for two weeks. - Follow-up with cardiologist for medication management      COPD, severe (HCC) Recent exacerbation, inconsistent oxygen  use, awaiting new nebulizer. - Encouraged consistent oxygen  use. - Continue inhaler regimen. - Followed by pulmonology.      Assessment and Plan Assessment & Plan     Follow-up: Return in about 2 weeks (around 06/09/2024) for PCP for hypertension.   Erique Kaser, NP

## 2024-05-26 NOTE — Patient Instructions (Addendum)
 Please monitor blood pressure daily twice a day and follow up in 2 weeks with PCP.

## 2024-05-27 DIAGNOSIS — R000 Tachycardia, unspecified: Secondary | ICD-10-CM | POA: Insufficient documentation

## 2024-05-27 NOTE — Assessment & Plan Note (Addendum)
 Hypertension with bradycardia episodes while in the hospital Metoprolol  and spirolactone discontinued.  - Monitor blood pressure and heart rate daily for two weeks. - Follow-up with cardiologist for medication management

## 2024-05-27 NOTE — Assessment & Plan Note (Addendum)
 Recent exacerbation, inconsistent oxygen  use, awaiting new nebulizer. - Encouraged consistent oxygen  use. - Continue inhaler regimen. - Followed by pulmonology.

## 2024-05-27 NOTE — Assessment & Plan Note (Addendum)
 Recent diagnosis, on Eliquis for anticoagulation. Bleeding risk noted. - Continue Eliquis for three months. - Referred to vein specialist. - Monitor for bleeding or swelling.  Orders:   Ambulatory referral to Vascular Surgery

## 2024-05-27 NOTE — Assessment & Plan Note (Addendum)
 Tachycardia, EKG with no acute changes. -Followed by cardiology. - Metoprolol  discontinued during recent hospitalization.   - Pt declined labs today, prefer to do it next OV In 2 weeks.  Orders:   EKG 12-Lead   CBC w/Diff   Comp Met (CMET)   TSH   Magnesium 

## 2024-05-31 ENCOUNTER — Telehealth: Payer: Self-pay | Admitting: Nurse Practitioner

## 2024-05-31 NOTE — Telephone Encounter (Signed)
 Lm and sent MyChart: [name] will be out of the office on February 27th.  Please call the office or reschedule your appointment through MyChart.  Thank you.  E2C2 please reschedule appt

## 2024-05-31 NOTE — Telephone Encounter (Signed)
 Form faxed

## 2024-06-03 ENCOUNTER — Other Ambulatory Visit: Payer: Self-pay | Admitting: Specialist

## 2024-06-03 DIAGNOSIS — F17210 Nicotine dependence, cigarettes, uncomplicated: Secondary | ICD-10-CM

## 2024-06-03 DIAGNOSIS — J449 Chronic obstructive pulmonary disease, unspecified: Secondary | ICD-10-CM

## 2024-06-09 ENCOUNTER — Telehealth: Payer: Self-pay

## 2024-06-09 NOTE — Telephone Encounter (Signed)
Attempted to call patient - no answer or voice mail.

## 2024-06-09 NOTE — Telephone Encounter (Signed)
 Sent message to check on US 

## 2024-06-09 NOTE — Telephone Encounter (Signed)
 Pt was referred to vein and vascular. Have they received call to scheduled appointment? No US  was ordered. I ordered labs and they declined lab on the appointment day and wanted labs drawn at 2 week follow up.   They were scheduled for 2 week follow up for hypertension. How is the BP reading now?

## 2024-06-09 NOTE — Telephone Encounter (Signed)
 Copied from CRM #[id number]. Topic: Clinical - Request for Lab/Test Order >> Jun 09, 2024 11:47 AM Kendralyn S wrote: Reason for CRM: lamar the son calling and mentioned the ultrasound on arm for blood clot that dr vincente wanted to do at the er follow up appointment, wants to know how to schedule that.

## 2024-06-09 NOTE — Telephone Encounter (Signed)
 Did you want a US  for this Patient's arm to rule out a blood clot? The son called in asking about this from the ED fu from 05/26/24.

## 2024-06-10 ENCOUNTER — Ambulatory Visit: Admitting: Nurse Practitioner

## 2024-06-10 NOTE — Telephone Encounter (Signed)
 Spoke to son Lamar and he states no one has contacted them to schedule the vein and vascular appointment. Lamar gave me a few BP readings the lowest one she has had was 136/70 and the highest one was 159/63. 154/68. I can try to call vein and vascular and let them know that you put a referral in.

## 2024-06-10 NOTE — Telephone Encounter (Signed)
 Noted! Thank you

## 2024-06-10 NOTE — Telephone Encounter (Signed)
 Left message to call the office back.

## 2024-06-10 NOTE — Telephone Encounter (Signed)
 Spoke to the Patient's son Lamar he states cardiology put her back on all of her medications that the hospital stopped. Per Leron Glance the Patient needs to take the medications for 2 weeks and keep track of her BP then send us  the readings to see how her BP is trending. Lamar her son is agreeable to this plan.

## 2024-06-10 NOTE — Telephone Encounter (Signed)
 Please schedule pt to follow up with PCP or available provider for hypertension follow up and check what medication is she taking for hypertension.

## 2024-06-22 ENCOUNTER — Ambulatory Visit
Admission: RE | Admit: 2024-06-22 | Discharge: 2024-06-22 | Disposition: A | Discharge status: Home / Self Care | Source: Ambulatory Visit | Attending: Specialist | Admitting: Specialist

## 2024-06-22 ENCOUNTER — Telehealth: Payer: Self-pay

## 2024-06-22 NOTE — Telephone Encounter (Signed)
 [facility] forms placed in provider folder

## 2024-06-24 NOTE — Telephone Encounter (Signed)
 faxed

## 2024-07-13 ENCOUNTER — Telehealth: Payer: Self-pay

## 2024-07-13 NOTE — Telephone Encounter (Signed)
 [facility] forms placed in provider to be signed folder

## 2024-07-15 NOTE — Telephone Encounter (Signed)
 Form faxed

## 2024-07-25 ENCOUNTER — Other Ambulatory Visit (INDEPENDENT_AMBULATORY_CARE_PROVIDER_SITE_OTHER): Payer: Self-pay | Admitting: Nurse Practitioner

## 2024-07-25 DIAGNOSIS — I82621 Acute embolism and thrombosis of deep veins of right upper extremity: Secondary | ICD-10-CM

## 2024-07-27 ENCOUNTER — Ambulatory Visit (INDEPENDENT_AMBULATORY_CARE_PROVIDER_SITE_OTHER): Payer: Self-pay | Admitting: Nurse Practitioner

## 2024-07-27 ENCOUNTER — Encounter (INDEPENDENT_AMBULATORY_CARE_PROVIDER_SITE_OTHER): Payer: Self-pay | Admitting: Nurse Practitioner

## 2024-07-27 ENCOUNTER — Other Ambulatory Visit (INDEPENDENT_AMBULATORY_CARE_PROVIDER_SITE_OTHER)

## 2024-07-27 VITALS — BP 146/78 | HR 56 | Resp 18 | Ht 63.0 in | Wt 149.6 lb

## 2024-07-27 DIAGNOSIS — I808 Phlebitis and thrombophlebitis of other sites: Secondary | ICD-10-CM | POA: Diagnosis not present

## 2024-07-27 DIAGNOSIS — I82621 Acute embolism and thrombosis of deep veins of right upper extremity: Secondary | ICD-10-CM | POA: Diagnosis not present

## 2024-07-31 ENCOUNTER — Encounter (INDEPENDENT_AMBULATORY_CARE_PROVIDER_SITE_OTHER): Payer: Self-pay | Admitting: Nurse Practitioner

## 2024-07-31 NOTE — Progress Notes (Signed)
 "  Subjective:    Patient ID: [name], female    DOB: July 26, 1950, 74 y.o.   MRN: [id number] Chief Complaint  Patient presents with   New Patient (Initial Visit)    Ref Vincente consult with ue dvt, right basilic svt    HPI  Discussed the use of AI scribe software for clinical note transcription with the patient, who gave verbal consent to proceed.  History of Present Illness [name] is a 74 year old female with recent right upper extremity thrombophlebitis who presents for vascular surgery follow-up.  On May 19, 2024, she was diagnosed with right upper extremity thrombophlebitis following intravenous line placement during a recent hospitalization. She experienced persistent pain at the IV site after infusion, initially attributed to the medication. The IV was subsequently removed and replaced at a different site after staff noted concerning local changes.  After discharge, a physical therapist noted lack of improvement in the right arm and recommended emergency evaluation, where thrombophlebitis was confirmed. She was started on apixaban and has remained adherent to therapy, with medication packs prepared in advance. She reports gastrointestinal side effects, specifically diarrhea, associated with apixaban.  Currently, she denies pain, tenderness, or other symptoms in the affected arm.    Results Radiology Right upper extremity imaging (05/19/2024): No evidence of thrombosis   Review of Systems  Cardiovascular:  Negative for leg swelling.  All other systems reviewed and are negative.      Objective:   Physical Exam Vitals reviewed.  HENT:     Head: Normocephalic.  Cardiovascular:     Rate and Rhythm: Normal rate.     Pulses:          Radial pulses are 2+ on the right side and 2+ on the left side.  Pulmonary:     Effort: Pulmonary effort is normal.  Skin:    General: Skin is warm and dry.  Neurological:     Mental Status: She is alert and oriented to  person, place, and time.  Psychiatric:        Mood and Affect: Mood normal.        Behavior: Behavior normal.        Thought Content: Thought content normal.        Judgment: Judgment normal.     Physical Exam EXTREMITIES: No tenderness in extremities  BP (!) 146/78 (BP Location: Left Arm)   Pulse (!) 56   Resp 18   Ht 5' 3 (1.6 m)   Wt 149 lb 9.6 oz (67.9 kg)   BMI 26.50 kg/m   Past Medical History:  Diagnosis Date   Asthma    CHF (congestive heart failure) (HCC)    Cirrhosis, non-alcoholic (HCC)    COPD (chronic obstructive pulmonary disease) (HCC)    Deaf    Depression    Diabetes mellitus without complication (HCC)    GERD (gastroesophageal reflux disease)    Heart murmur    Hepatitis    History of rheumatic fever    History of scarlet fever    Hypertension    IBS (irritable bowel syndrome)    Lymph node disorder    arm   Neuropathy    On home oxygen  therapy    hs   Orthopnea    Osteoarthritis    RA (rheumatoid arthritis) (HCC)    RLS (restless legs syndrome)    Seizures (HCC)    Shortness of breath dyspnea    Sleep apnea    Stroke (HCC)  tia    Social History   Socioeconomic History   Marital status: Widowed    Spouse name: Not on file   Number of children: Not on file   Years of education: Not on file   Highest education level: Not on file  Occupational History   Not on file  Tobacco Use   Smoking status: Every Day    Current packs/day: 0.50    Average packs/day: 0.5 packs/day for 56.4 years (28.2 ttl pk-yrs)    Types: Cigarettes    Start date: 03/01/1968   Smokeless tobacco: Never   Tobacco comments:    Quit smoking 3 weeks ago. Using nicotine  patch.    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    Social Drivers of Health   Tobacco Use: High Risk (07/31/2024)   Patient History    Smoking Tobacco Use: Every Day    Smokeless Tobacco Use: Never    Passive Exposure: Not on file  Financial Resource Strain: High Risk (02/14/2024)   Received from [facility]   Overall Financial Resource Strain (CARDIA)    Difficulty of Paying Living Expenses: Very hard  Food Insecurity: No Food Insecurity (05/17/2024)   Epic    Worried About Running Out of Food in the Last Year: Never true    Ran Out of Food in the Last Year: Never true  Transportation Needs: No Transportation Needs (05/17/2024)   Epic    Lack of Transportation (Medical): No    Lack of Transportation (Non-Medical): No  Physical Activity: Not on file  Stress: Not on file  Social Connections: Moderately Integrated (09/14/2023)   Social Connection and Isolation Panel    Frequency of Communication with Friends and Family: Three times a week    Frequency of Social Gatherings with Friends and Family: Three times a week    Attends Religious Services: More than 4 times per year    Active Member of Clubs or Organizations: Yes    Attends Banker Meetings: More than 4 times per year    Marital Status: Widowed  Intimate Partner Violence: Not At Risk (05/17/2024)   Epic    Fear of Current or Ex-Partner: No    Emotionally Abused: No    Physically Abused: No    Sexually Abused: No  Depression (PHQ2-9): Low Risk (05/26/2024)   Depression (PHQ2-9)    PHQ-2 Score: 3  Alcohol  Screen: Not on file  Housing: Unknown (05/17/2024)   Epic    Unable to Pay for Housing in the Last Year: No    Number of Times Moved in the Last Year: Not on file    Homeless in the Last Year: No  Utilities: Not At Risk (05/17/2024)   Epic    Threatened with loss of utilities: No  Health Literacy: Not on file    Past Surgical  History:  Procedure Laterality Date   ABDOMINAL HYSTERECTOMY     BREAST BIOPSY Right 10/30/2020   Stereo Bx, X-clip,  BENIGN BREAST TISSUE WITH FIBROADENOMATOUS   CATARACT EXTRACTION W/PHACO Right  11/23/2014   Procedure: CATARACT EXTRACTION PHACO AND INTRAOCULAR LENS PLACEMENT (IOC);  Surgeon: Newell Ovens, MD;  Location: [facility] OR[facility];  Service: Ophthalmology;  Laterality: Right;   CATARACT EXTRACTION W/PHACO Left 12/14/2014   Procedure: CATARACT EXTRACTION PHACO AND INTRAOCULAR LENS PLACEMENT (IOC);  Surgeon: Newell Ovens, MD;  Location: [facility];  Service: Ophthalmology;  Laterality: Left;  US :01:16.6 AP:15.8 CDE:12.14   CESAREAN SECTION     CHOLECYSTECTOMY     COLONOSCOPY WITH PROPOFOL  N/A 10/08/2020   Procedure: COLONOSCOPY WITH PROPOFOL ;  Surgeon: Toledo, Ladell POUR, MD;  Location: [facility];  Service: Gastroenterology;  Laterality: N/A;   ESOPHAGOGASTRODUODENOSCOPY (EGD) WITH PROPOFOL  N/A 10/08/2020   Procedure: ESOPHAGOGASTRODUODENOSCOPY (EGD) WITH PROPOFOL ;  Surgeon: Toledo, Ladell POUR, MD;  Location: [facility];  Service: Gastroenterology;  Laterality: N/A;  DM DEAF, NEEDS SIGN INTERPRETER PER SON   REVERSE SHOULDER ARTHROPLASTY Right 12/21/2019   Procedure: REVERSE SHOULDER ARTHROPLASTY;  Surgeon: Leora Lynwood SAUNDERS, MD;  Location: [facility];  Service: Orthopedics;  Laterality: Right;   THUMB ARTHROSCOPY     TONSILLECTOMY     TYMPANOPLASTY     muliple    Family History  Problem Relation Age of Onset   Lung cancer Mother    CAD Father    Miscarriages / Stillbirths Sister    Hyperlipidemia Sister    Hypertension Sister    Diabetes Sister    COPD Sister    Asthma Sister    Arthritis Sister    Breast cancer Sister    Heart attack Sister    Hyperlipidemia Sister    Heart disease Sister    Heart attack Sister    Diabetes Sister    Cancer Sister    Asthma Sister    Heart disease Brother    Heart attack Brother    Depression Brother    Hypertension Son     Hyperlipidemia Son    Heart disease Son    Depression Son    Hypertension Daughter    Depression Daughter    Asthma Daughter    Birth defects Paternal Uncle    Hearing loss Brother     Allergies[1]     Latest Ref Rng & Units 01/13/2024   11:26 AM 09/13/2023    5:09 AM 09/12/2023    9:09 AM  CBC  WBC 4.0 - 10.5 K/uL 5.1  6.3  5.8   Hemoglobin 12.0 - 15.0 g/dL 86.8  87.6  86.3   Hematocrit 36.0 - 46.0 % 40.8  37.8  41.7   Platelets 150.0 - 400.0 K/uL 254.0  236  244       CMP     Component Value Date/Time   NA 141 01/13/2024 1126   NA 139 11/05/2013 1138   K 4.3 01/13/2024 1126   K 3.3 (L) 11/05/2013 1138   CL 105 01/13/2024 1126   CL 103 11/05/2013 1138   CO2 27 01/13/2024 1126   CO2 29 11/05/2013 1138   GLUCOSE 59 (L) 01/13/2024 1126   GLUCOSE 219 (H) 11/05/2013 1138   BUN 10 01/13/2024 1126   BUN 9 11/05/2013 1138   CREATININE 0.82 01/13/2024 1126   CREATININE 0.35 (L) 11/05/2013 1138   CALCIUM  9.7 01/13/2024 1126   CALCIUM  8.9 11/05/2013 1138   PROT 7.5 01/13/2024 1126   PROT 7.0 11/05/2013 1138   ALBUMIN 4.4 01/13/2024 1126   ALBUMIN 3.4 11/05/2013 1138   AST 21 01/13/2024 1126   AST 48 (H) 11/05/2013 1138   ALT 13 01/13/2024 1126   ALT  35 11/05/2013 1138   ALKPHOS 102 01/13/2024 1126   ALKPHOS 145 (H) 11/05/2013 1138   BILITOT 0.4 01/13/2024 1126   BILITOT 0.3 11/05/2013 1138   GFR 71.43 01/13/2024 1126   GFRNONAA >60 09/13/2023 0509   GFRNONAA >60 11/05/2013 1138     No results found.     Assessment & Plan:   1. Superficial phlebitis of arm (Primary) Right upper extremity thrombophlebitis Resolved iatrogenic thrombophlebitis with minimal recurrence risk and no long-term sequelae. - Continued Eliquis until February 11-12, 2026. - Advised against refilling Eliquis after current supply. - Recommended avoiding future IV placement in affected area. - No follow-up unless symptoms recur.   Assessment and Plan Assessment &  Plan      Medications Ordered Prior to Encounter[2]  There are no Patient Instructions on file for this visit. No follow-ups on file.   Orvin FORBES Daring, NP      [1]  Allergies Allergen Reactions   Celebrex [Celecoxib] Itching    itching   Ciprofloxacin Itching   Codeine Itching   Fosphenytoin Itching and Other (See Comments)   Levaquin  [Levofloxacin  In D5w] Itching   Levofloxacin  Itching   Lovastatin Itching   Pravastatin Itching   Sulfa Antibiotics Itching   Aspirin Itching and Rash   Azithromycin  Rash   Penicillins Rash    Documentation indicates severe reaction  Pt tolerated cephalosporin without adverse reaction 09/18   [2]  Current Outpatient Medications on File Prior to Visit  Medication Sig Dispense Refill   benzonatate  (TESSALON ) 200 MG capsule Take 1 capsule (200 mg total) by mouth 3 (three) times daily as needed for cough. 30 capsule 0   citalopram  (CELEXA ) 10 MG tablet Take 1 tablet (10 mg total) by mouth daily. 90 tablet 3   clopidogrel  (PLAVIX ) 75 MG tablet Take 1 tablet (75 mg total) by mouth daily. 90 tablet 3   ezetimibe (ZETIA) 10 MG tablet Take 1 tablet by mouth daily.     fluticasone  (FLONASE ) 50 MCG/ACT nasal spray Place 1 spray into both nostrils 2 (two) times daily.     gabapentin  (NEURONTIN ) 100 MG capsule Take 2 capsules (200 mg total) by mouth at bedtime. 180 capsule 3   glimepiride  (AMARYL ) 2 MG tablet Take 1 tablet (2 mg total) by mouth daily with breakfast. 90 tablet 3   ipratropium (ATROVENT ) 0.02 % nebulizer solution Take 2.5 mLs (0.5 mg total) by nebulization 4 (four) times daily. 75 mL 12   isosorbide  mononitrate (IMDUR ) 30 MG 24 hr tablet Take 30 mg by mouth daily.     pantoprazole  (PROTONIX ) 40 MG tablet Take 1 tablet (40 mg total) by mouth daily. 90 tablet 3   rosuvastatin  (CRESTOR ) 10 MG tablet Take 1 tablet (10 mg total) by mouth at bedtime. 90 tablet 3   doxycycline  (VIBRA -TABS) 100 MG tablet Take 1 tablet (100 mg total) by mouth  2 (two) times daily. (Patient not taking: Reported on 07/27/2024) 14 tablet 0   furosemide  (LASIX ) 40 MG tablet Take 1 tablet (40 mg total) by mouth daily. (Patient not taking: Reported on 07/27/2024) 90 tablet 3   Lacosamide  (VIMPAT ) 150 MG TABS Take 150 mg by mouth every 12 (twelve) hours. (Patient not taking: Reported on 07/27/2024)     metoprolol  succinate (TOPROL -XL) 50 MG 24 hr tablet Take 1 tablet (50 mg total) by mouth daily. (Patient not taking: Reported on 07/27/2024) 90 tablet 3   montelukast  (SINGULAIR ) 10 MG tablet Take 1 tablet (10 mg total) by mouth at  bedtime. (Patient not taking: Reported on 07/27/2024) 90 tablet 3   potassium chloride  (KLOR-CON ) 10 MEQ tablet Take 2 tablets (20 mEq total) by mouth daily. (Patient not taking: Reported on 07/27/2024) 180 tablet 3   predniSONE  (DELTASONE ) 20 MG tablet Take 2 tablets (40 mg total) by mouth daily. (Patient not taking: Reported on 07/27/2024) 10 tablet 0   rOPINIRole  (REQUIP ) 0.5 MG tablet Take 1 tablet (0.5 mg total) by mouth at bedtime. (Patient not taking: Reported on 07/27/2024) 30 tablet 0   spironolactone  (ALDACTONE ) 25 MG tablet Take 1 tablet by mouth daily. (Patient not taking: Reported on 07/27/2024)     No current facility-administered medications on file prior to visit.   "

## 2024-08-04 ENCOUNTER — Ambulatory Visit: Admission: RE | Admit: 2024-08-04 | Source: Ambulatory Visit

## 2024-09-02 ENCOUNTER — Ambulatory Visit: Admitting: Nurse Practitioner

## 2024-09-21 ENCOUNTER — Ambulatory Visit: Admitting: Nurse Practitioner
